# Patient Record
Sex: Female | Born: 1960 | Race: White | Hispanic: No | Marital: Single | Smoking: Former smoker
Health system: Southern US, Community
[De-identification: ages and names within clinical notes are randomized; demographics above are authoritative.]

## PROBLEM LIST (undated history)

## (undated) DIAGNOSIS — D6861 Antiphospholipid syndrome: Secondary | ICD-10-CM

## (undated) DIAGNOSIS — I4819 Other persistent atrial fibrillation: Secondary | ICD-10-CM

## (undated) DIAGNOSIS — I10 Essential (primary) hypertension: Secondary | ICD-10-CM

## (undated) DIAGNOSIS — Z95810 Presence of automatic (implantable) cardiac defibrillator: Secondary | ICD-10-CM

## (undated) DIAGNOSIS — I639 Cerebral infarction, unspecified: Secondary | ICD-10-CM

## (undated) DIAGNOSIS — R51 Headache: Secondary | ICD-10-CM

## (undated) DIAGNOSIS — I255 Ischemic cardiomyopathy: Secondary | ICD-10-CM

## (undated) DIAGNOSIS — K219 Gastro-esophageal reflux disease without esophagitis: Secondary | ICD-10-CM

## (undated) DIAGNOSIS — I472 Ventricular tachycardia: Secondary | ICD-10-CM

## (undated) DIAGNOSIS — I5042 Chronic combined systolic (congestive) and diastolic (congestive) heart failure: Secondary | ICD-10-CM

## (undated) DIAGNOSIS — I739 Peripheral vascular disease, unspecified: Secondary | ICD-10-CM

## (undated) DIAGNOSIS — Z8701 Personal history of pneumonia (recurrent): Secondary | ICD-10-CM

## (undated) DIAGNOSIS — Z789 Other specified health status: Secondary | ICD-10-CM

## (undated) DIAGNOSIS — E782 Mixed hyperlipidemia: Secondary | ICD-10-CM

## (undated) DIAGNOSIS — I219 Acute myocardial infarction, unspecified: Secondary | ICD-10-CM

## (undated) DIAGNOSIS — I4729 Other ventricular tachycardia: Secondary | ICD-10-CM

## (undated) DIAGNOSIS — I671 Cerebral aneurysm, nonruptured: Secondary | ICD-10-CM

## (undated) DIAGNOSIS — I2510 Atherosclerotic heart disease of native coronary artery without angina pectoris: Secondary | ICD-10-CM

## (undated) DIAGNOSIS — R519 Headache, unspecified: Secondary | ICD-10-CM

## (undated) DIAGNOSIS — I7300 Raynaud's syndrome without gangrene: Secondary | ICD-10-CM

## (undated) DIAGNOSIS — F1411 Cocaine abuse, in remission: Secondary | ICD-10-CM

## (undated) DIAGNOSIS — R760 Raised antibody titer: Secondary | ICD-10-CM

## (undated) HISTORY — PX: OTHER SURGICAL HISTORY: SHX169

## (undated) HISTORY — PX: APPENDECTOMY: SHX54

## (undated) HISTORY — DX: Peripheral vascular disease, unspecified: I73.9

## (undated) HISTORY — DX: Atherosclerotic heart disease of native coronary artery without angina pectoris: I25.10

## (undated) HISTORY — DX: Raynaud's syndrome without gangrene: I73.00

## (undated) HISTORY — DX: Ischemic cardiomyopathy: I25.5

## (undated) HISTORY — DX: Cerebral infarction, unspecified: I63.9

## (undated) HISTORY — DX: Antiphospholipid syndrome: D68.61

## (undated) HISTORY — DX: Essential (primary) hypertension: I10

## (undated) HISTORY — PX: CARDIAC DEFIBRILLATOR PLACEMENT: SHX171

## (undated) HISTORY — DX: Raised antibody titer: R76.0

## (undated) HISTORY — DX: Personal history of pneumonia (recurrent): Z87.01

## (undated) HISTORY — DX: Cocaine abuse, in remission: F14.11

## (undated) HISTORY — DX: Cerebral aneurysm, nonruptured: I67.1

## (undated) HISTORY — DX: Mixed hyperlipidemia: E78.2

## (undated) SURGERY — Surgical Case
Anesthesia: *Unknown

---

## 2005-12-30 DIAGNOSIS — I639 Cerebral infarction, unspecified: Secondary | ICD-10-CM

## 2005-12-30 HISTORY — DX: Cerebral infarction, unspecified: I63.9

## 2007-12-08 ENCOUNTER — Emergency Department (HOSPITAL_COMMUNITY): Admission: EM | Admit: 2007-12-08 | Discharge: 2007-12-08 | Payer: Self-pay | Admitting: Infectious Diseases

## 2007-12-16 ENCOUNTER — Ambulatory Visit: Payer: Self-pay | Admitting: Cardiology

## 2007-12-28 ENCOUNTER — Ambulatory Visit: Payer: Self-pay | Admitting: Cardiovascular Disease

## 2008-01-11 ENCOUNTER — Ambulatory Visit: Payer: Self-pay | Admitting: Cardiology

## 2008-01-13 ENCOUNTER — Ambulatory Visit: Payer: Self-pay

## 2008-01-25 ENCOUNTER — Ambulatory Visit: Payer: Self-pay | Admitting: Cardiology

## 2008-03-01 ENCOUNTER — Ambulatory Visit: Payer: Self-pay | Admitting: Cardiology

## 2008-03-08 ENCOUNTER — Ambulatory Visit: Payer: Self-pay | Admitting: Cardiology

## 2008-03-25 ENCOUNTER — Ambulatory Visit: Payer: Self-pay | Admitting: Internal Medicine

## 2008-03-31 ENCOUNTER — Ambulatory Visit: Payer: Self-pay | Admitting: Cardiovascular Disease

## 2008-04-14 ENCOUNTER — Ambulatory Visit: Payer: Self-pay | Admitting: Cardiology

## 2008-04-14 ENCOUNTER — Encounter (HOSPITAL_COMMUNITY): Admission: RE | Admit: 2008-04-14 | Discharge: 2008-05-14 | Payer: Self-pay | Admitting: Cardiovascular Disease

## 2008-05-19 ENCOUNTER — Ambulatory Visit: Payer: Self-pay | Admitting: Cardiovascular Disease

## 2008-05-30 ENCOUNTER — Emergency Department (HOSPITAL_COMMUNITY): Admission: EM | Admit: 2008-05-30 | Discharge: 2008-05-30 | Payer: Self-pay | Admitting: Emergency Medicine

## 2008-06-08 ENCOUNTER — Ambulatory Visit (HOSPITAL_COMMUNITY): Admission: RE | Admit: 2008-06-08 | Discharge: 2008-06-08 | Payer: Self-pay | Admitting: Neurology

## 2008-06-09 ENCOUNTER — Ambulatory Visit: Payer: Self-pay

## 2008-09-15 ENCOUNTER — Ambulatory Visit: Payer: Self-pay | Admitting: Cardiology

## 2008-09-29 ENCOUNTER — Ambulatory Visit: Payer: Self-pay | Admitting: Cardiology

## 2008-10-25 ENCOUNTER — Ambulatory Visit: Payer: Self-pay | Admitting: Cardiology

## 2008-10-26 ENCOUNTER — Ambulatory Visit (HOSPITAL_COMMUNITY): Admission: RE | Admit: 2008-10-26 | Discharge: 2008-10-26 | Payer: Self-pay | Admitting: Cardiology

## 2008-10-26 ENCOUNTER — Ambulatory Visit: Payer: Self-pay | Admitting: Cardiology

## 2008-10-26 ENCOUNTER — Encounter: Payer: Self-pay | Admitting: Cardiology

## 2008-11-18 ENCOUNTER — Ambulatory Visit (HOSPITAL_COMMUNITY): Admission: RE | Admit: 2008-11-18 | Discharge: 2008-11-18 | Payer: Self-pay | Admitting: Pulmonary Disease

## 2008-11-29 DIAGNOSIS — I255 Ischemic cardiomyopathy: Secondary | ICD-10-CM | POA: Insufficient documentation

## 2008-11-29 DIAGNOSIS — D6861 Antiphospholipid syndrome: Secondary | ICD-10-CM | POA: Insufficient documentation

## 2008-11-29 DIAGNOSIS — I679 Cerebrovascular disease, unspecified: Secondary | ICD-10-CM | POA: Insufficient documentation

## 2008-12-07 ENCOUNTER — Ambulatory Visit: Payer: Self-pay | Admitting: Internal Medicine

## 2008-12-15 ENCOUNTER — Ambulatory Visit: Payer: Self-pay | Admitting: Cardiology

## 2009-01-11 ENCOUNTER — Ambulatory Visit: Payer: Self-pay | Admitting: Cardiology

## 2009-01-19 ENCOUNTER — Ambulatory Visit: Payer: Self-pay

## 2009-02-01 ENCOUNTER — Ambulatory Visit: Payer: Self-pay | Admitting: Cardiology

## 2009-02-16 ENCOUNTER — Ambulatory Visit: Payer: Self-pay | Admitting: Cardiology

## 2009-03-02 ENCOUNTER — Encounter: Payer: Self-pay | Admitting: Internal Medicine

## 2009-03-06 ENCOUNTER — Ambulatory Visit: Payer: Self-pay | Admitting: Cardiology

## 2009-03-17 ENCOUNTER — Ambulatory Visit: Payer: Self-pay | Admitting: Cardiology

## 2009-03-20 ENCOUNTER — Ambulatory Visit: Payer: Self-pay | Admitting: Cardiology

## 2009-03-20 ENCOUNTER — Ambulatory Visit (HOSPITAL_COMMUNITY): Admission: RE | Admit: 2009-03-20 | Discharge: 2009-03-20 | Payer: Self-pay | Admitting: Cardiology

## 2009-03-20 ENCOUNTER — Encounter: Payer: Self-pay | Admitting: Cardiology

## 2009-04-06 ENCOUNTER — Ambulatory Visit: Payer: Self-pay | Admitting: Cardiology

## 2009-04-20 ENCOUNTER — Ambulatory Visit: Payer: Self-pay | Admitting: Cardiology

## 2009-04-20 ENCOUNTER — Encounter: Payer: Self-pay | Admitting: Internal Medicine

## 2009-05-19 ENCOUNTER — Encounter (INDEPENDENT_AMBULATORY_CARE_PROVIDER_SITE_OTHER): Payer: Self-pay

## 2009-05-19 LAB — CONVERTED CEMR LAB
ALT: 19 units/L
AST: 17 units/L
Albumin: 3.9 g/dL
Alkaline Phosphatase: 59 units/L
Bilirubin, Direct: 0.1 mg/dL
Cholesterol: 165 mg/dL
HDL: 33 mg/dL
LDL Cholesterol: 101 mg/dL
Potassium: 4.3 meq/L
Total Protein: 6.8 g/dL
Triglycerides: 153 mg/dL

## 2009-05-25 ENCOUNTER — Ambulatory Visit: Payer: Self-pay | Admitting: Cardiology

## 2009-06-23 ENCOUNTER — Encounter: Payer: Self-pay | Admitting: Cardiology

## 2009-06-23 ENCOUNTER — Ambulatory Visit: Payer: Self-pay | Admitting: Cardiology

## 2009-06-23 DIAGNOSIS — I739 Peripheral vascular disease, unspecified: Secondary | ICD-10-CM | POA: Insufficient documentation

## 2009-07-24 ENCOUNTER — Encounter: Payer: Self-pay | Admitting: Internal Medicine

## 2009-08-09 ENCOUNTER — Ambulatory Visit: Payer: Self-pay | Admitting: Cardiology

## 2009-08-11 ENCOUNTER — Encounter: Payer: Self-pay | Admitting: Internal Medicine

## 2009-08-14 ENCOUNTER — Encounter: Payer: Self-pay | Admitting: *Deleted

## 2009-09-08 ENCOUNTER — Encounter: Payer: Self-pay | Admitting: Internal Medicine

## 2009-09-20 ENCOUNTER — Encounter (INDEPENDENT_AMBULATORY_CARE_PROVIDER_SITE_OTHER): Payer: Self-pay | Admitting: Cardiology

## 2009-09-26 ENCOUNTER — Encounter: Payer: Self-pay | Admitting: Cardiology

## 2009-09-26 DIAGNOSIS — E782 Mixed hyperlipidemia: Secondary | ICD-10-CM | POA: Insufficient documentation

## 2009-09-26 DIAGNOSIS — E785 Hyperlipidemia, unspecified: Secondary | ICD-10-CM | POA: Insufficient documentation

## 2009-10-16 ENCOUNTER — Encounter: Payer: Self-pay | Admitting: Internal Medicine

## 2009-10-19 ENCOUNTER — Encounter (INDEPENDENT_AMBULATORY_CARE_PROVIDER_SITE_OTHER): Payer: Self-pay | Admitting: Cardiology

## 2009-10-25 ENCOUNTER — Encounter (INDEPENDENT_AMBULATORY_CARE_PROVIDER_SITE_OTHER): Payer: Self-pay

## 2009-11-16 ENCOUNTER — Encounter (INDEPENDENT_AMBULATORY_CARE_PROVIDER_SITE_OTHER): Payer: Self-pay | Admitting: Cardiology

## 2009-11-29 HISTORY — PX: OTHER SURGICAL HISTORY: SHX169

## 2009-12-09 ENCOUNTER — Encounter: Payer: Self-pay | Admitting: Emergency Medicine

## 2009-12-10 ENCOUNTER — Inpatient Hospital Stay (HOSPITAL_COMMUNITY): Admission: EM | Admit: 2009-12-10 | Discharge: 2009-12-21 | Payer: Self-pay | Admitting: Emergency Medicine

## 2009-12-10 ENCOUNTER — Ambulatory Visit: Payer: Self-pay | Admitting: Oncology

## 2009-12-10 ENCOUNTER — Ambulatory Visit: Payer: Self-pay | Admitting: Cardiology

## 2009-12-11 ENCOUNTER — Ambulatory Visit: Payer: Self-pay | Admitting: Thoracic Surgery

## 2009-12-12 ENCOUNTER — Encounter: Payer: Self-pay | Admitting: Cardiology

## 2009-12-14 ENCOUNTER — Encounter (INDEPENDENT_AMBULATORY_CARE_PROVIDER_SITE_OTHER): Payer: Self-pay | Admitting: Cardiology

## 2009-12-16 ENCOUNTER — Ambulatory Visit: Payer: Self-pay | Admitting: Hematology & Oncology

## 2009-12-18 ENCOUNTER — Ambulatory Visit: Payer: Self-pay | Admitting: Physical Medicine & Rehabilitation

## 2009-12-21 ENCOUNTER — Ambulatory Visit: Payer: Self-pay | Admitting: Oncology

## 2009-12-25 ENCOUNTER — Emergency Department (HOSPITAL_COMMUNITY): Admission: EM | Admit: 2009-12-25 | Discharge: 2009-12-25 | Payer: Self-pay | Admitting: Emergency Medicine

## 2009-12-25 ENCOUNTER — Encounter (INDEPENDENT_AMBULATORY_CARE_PROVIDER_SITE_OTHER): Payer: Self-pay | Admitting: *Deleted

## 2009-12-25 LAB — CONVERTED CEMR LAB
BUN: 16 mg/dL
CO2: 26 meq/L
Calcium: 9.3 mg/dL
Chloride: 96 meq/L
Creatinine, Ser: 0.72 mg/dL
GFR calc non Af Amer: >60 mL/min
Glomerular Filtration Rate, Af Am: >60 mL/min/{1.73_m2}
Glucose, Bld: 100 mg/dL
Potassium: 4.3 meq/L
Sodium: 132 meq/L

## 2009-12-27 ENCOUNTER — Ambulatory Visit: Payer: Self-pay | Admitting: Thoracic Surgery

## 2009-12-27 ENCOUNTER — Ambulatory Visit: Payer: Self-pay | Admitting: Cardiology

## 2009-12-27 ENCOUNTER — Encounter: Admission: RE | Admit: 2009-12-27 | Discharge: 2009-12-27 | Payer: Self-pay | Admitting: Thoracic Surgery

## 2009-12-27 LAB — CONVERTED CEMR LAB: POC INR: 3.1

## 2010-01-03 ENCOUNTER — Ambulatory Visit: Payer: Self-pay | Admitting: Cardiology

## 2010-01-03 LAB — CONVERTED CEMR LAB: POC INR: 3.1

## 2010-01-12 ENCOUNTER — Encounter (INDEPENDENT_AMBULATORY_CARE_PROVIDER_SITE_OTHER): Payer: Self-pay | Admitting: *Deleted

## 2010-01-17 ENCOUNTER — Ambulatory Visit: Payer: Self-pay | Admitting: Cardiology

## 2010-01-18 ENCOUNTER — Telehealth (INDEPENDENT_AMBULATORY_CARE_PROVIDER_SITE_OTHER): Payer: Self-pay

## 2010-01-25 ENCOUNTER — Ambulatory Visit: Payer: Self-pay | Admitting: Cardiovascular Disease

## 2010-01-25 LAB — CONVERTED CEMR LAB: POC INR: 3.5

## 2010-01-26 ENCOUNTER — Ambulatory Visit (HOSPITAL_COMMUNITY)
Admission: RE | Admit: 2010-01-26 | Discharge: 2010-01-26 | Payer: Self-pay | Source: Home / Self Care | Admitting: Cardiology

## 2010-01-30 ENCOUNTER — Encounter: Payer: Self-pay | Admitting: Cardiology

## 2010-02-07 ENCOUNTER — Ambulatory Visit: Payer: Self-pay | Admitting: Thoracic Surgery

## 2010-02-07 ENCOUNTER — Encounter: Admission: RE | Admit: 2010-02-07 | Discharge: 2010-02-07 | Payer: Self-pay | Admitting: Thoracic Surgery

## 2010-02-22 ENCOUNTER — Encounter: Admission: RE | Admit: 2010-02-22 | Discharge: 2010-02-22 | Payer: Self-pay | Admitting: Neurosurgery

## 2010-02-22 ENCOUNTER — Encounter (INDEPENDENT_AMBULATORY_CARE_PROVIDER_SITE_OTHER): Payer: Self-pay | Admitting: Cardiology

## 2010-03-05 ENCOUNTER — Ambulatory Visit: Payer: Self-pay | Admitting: Cardiology

## 2010-03-05 LAB — CONVERTED CEMR LAB: POC INR: 4.3

## 2010-03-19 ENCOUNTER — Ambulatory Visit: Payer: Self-pay | Admitting: Cardiology

## 2010-03-19 LAB — CONVERTED CEMR LAB: POC INR: 3.8

## 2010-04-11 ENCOUNTER — Ambulatory Visit: Payer: Self-pay | Admitting: Cardiology

## 2010-04-11 LAB — CONVERTED CEMR LAB: POC INR: 2

## 2010-04-30 ENCOUNTER — Ambulatory Visit: Payer: Self-pay | Admitting: Cardiology

## 2010-04-30 LAB — CONVERTED CEMR LAB: POC INR: 2.8

## 2010-05-14 ENCOUNTER — Emergency Department (HOSPITAL_COMMUNITY)
Admission: EM | Admit: 2010-05-14 | Discharge: 2010-05-14 | Payer: Self-pay | Source: Home / Self Care | Admitting: Emergency Medicine

## 2010-05-15 ENCOUNTER — Encounter: Payer: Self-pay | Admitting: Cardiology

## 2010-05-15 ENCOUNTER — Telehealth: Payer: Self-pay | Admitting: Cardiology

## 2010-05-15 LAB — CONVERTED CEMR LAB
INR: 2.19
Prothrombin Time: 24.2 s

## 2010-05-29 ENCOUNTER — Ambulatory Visit: Payer: Self-pay | Admitting: Cardiology

## 2010-05-29 DIAGNOSIS — I10 Essential (primary) hypertension: Secondary | ICD-10-CM | POA: Insufficient documentation

## 2010-05-29 DIAGNOSIS — R079 Chest pain, unspecified: Secondary | ICD-10-CM | POA: Insufficient documentation

## 2010-06-14 ENCOUNTER — Ambulatory Visit: Payer: Self-pay | Admitting: Cardiology

## 2010-06-14 ENCOUNTER — Encounter (HOSPITAL_COMMUNITY)
Admission: RE | Admit: 2010-06-14 | Discharge: 2010-06-14 | Payer: Self-pay | Source: Home / Self Care | Admitting: Cardiology

## 2010-06-14 ENCOUNTER — Ambulatory Visit (HOSPITAL_COMMUNITY)
Admission: RE | Admit: 2010-06-14 | Discharge: 2010-06-14 | Payer: Self-pay | Source: Home / Self Care | Admitting: Pulmonary Disease

## 2010-06-14 LAB — CONVERTED CEMR LAB: POC INR: 3

## 2010-06-25 ENCOUNTER — Encounter: Payer: Self-pay | Admitting: Cardiology

## 2010-07-11 ENCOUNTER — Ambulatory Visit: Payer: Self-pay | Admitting: Cardiology

## 2010-07-11 LAB — CONVERTED CEMR LAB: POC INR: 2.6

## 2010-08-09 ENCOUNTER — Encounter (INDEPENDENT_AMBULATORY_CARE_PROVIDER_SITE_OTHER): Payer: Self-pay | Admitting: *Deleted

## 2010-08-29 ENCOUNTER — Ambulatory Visit: Payer: Self-pay | Admitting: Cardiology

## 2010-08-29 LAB — CONVERTED CEMR LAB: POC INR: 3

## 2010-09-27 ENCOUNTER — Telehealth (INDEPENDENT_AMBULATORY_CARE_PROVIDER_SITE_OTHER): Payer: Self-pay

## 2010-09-28 ENCOUNTER — Encounter (INDEPENDENT_AMBULATORY_CARE_PROVIDER_SITE_OTHER): Payer: Self-pay | Admitting: *Deleted

## 2010-10-01 ENCOUNTER — Encounter (INDEPENDENT_AMBULATORY_CARE_PROVIDER_SITE_OTHER): Payer: Self-pay | Admitting: *Deleted

## 2010-10-18 ENCOUNTER — Encounter: Payer: Self-pay | Admitting: Internal Medicine

## 2010-11-12 ENCOUNTER — Encounter: Payer: Self-pay | Admitting: Internal Medicine

## 2010-12-07 ENCOUNTER — Encounter (INDEPENDENT_AMBULATORY_CARE_PROVIDER_SITE_OTHER): Payer: Self-pay | Admitting: *Deleted

## 2011-01-20 ENCOUNTER — Encounter: Payer: Self-pay | Admitting: Thoracic Surgery

## 2011-01-21 ENCOUNTER — Encounter: Payer: Self-pay | Admitting: Cardiology

## 2011-01-29 NOTE — Letter (Signed)
Summary: Device-Delinquent Check  [facility], Main Office  [address]   [place], [place] [address]   Phone: [phone]  Fax: [phone]     September 08, 2009 MRN: [id number]   [name] [name] [address] [place], [place]  [address]   Dear [name] [name],  According to our records, you have not had your implanted device checked in the recommended period of time.  We are unable to determine appropriate device function without checking your device on a regular basis.  Please call our office to schedule an appointment as soon as possible.  If you are having your device checked by another physician, please call us so that we may update our records.  Thank you,   Architectural technologist Device Clinic     *Sent via Certified Mail/AS

## 2011-01-29 NOTE — Letter (Signed)
Summary: Custom - Delinquent Coumadin 2  [facility]  [address] [place] [address]   Phone: [phone]  Fax: [phone]     December 14, 2009 MRN: [id number]   [employer] W[place] [address] [place], [place]  [address]   Dear Ms. Lutes,  We have attempted to contact you by phone and letter on multiple occasions to contact our office for important blood work associated with the blood thinner, warfarin (Coumadin).  Warfarin is a very important drug that can cause life threatening side effects including, bleeding, and thus requires close laboratory monitoring.  We are unable to accept responsibility for blood thinner-related health problems you may develop because you have not followed our recommendations for appropriate monitoring.  These may include abnormal bleeding occurrences and/or development of blood clots (stroke, heart attack, blood clots in legs or lungs, etc.).  We need for you to contact this office at the number listed above to schedule and complete this very important blood work.  Thank you for your assistance in this urgent matter.  Sincerely, [name], RN [facility] Cardiovascular Risk Reduction [facility] Team

## 2011-01-29 NOTE — Miscellaneous (Signed)
Summary: labs hosp bmp,12/25/2009  Clinical Lists Changes  Observations: Added new observation of CALCIUM: 9.3 mg/dL (16/09/9603 54:09) Added new observation of GFR AA: >60 mL/min/1.34m2 (12/25/2009 15:55) Added new observation of GFR: >60 mL/min (12/25/2009 15:55) Added new observation of CREATININE: 0.72 mg/dL (81/19/1478 29:56) Added new observation of BUN: 16 mg/dL (21/30/8657 84:69) Added new observation of BG RANDOM: 100 mg/dL (62/95/2841 32:44) Added new observation of CO2 PLSM/SER: 26 meq/L (12/25/2009 15:55) Added new observation of CL SERUM: 96 meq/L (12/25/2009 15:55) Added new observation of K SERUM: 4.3 meq/L (12/25/2009 15:55) Added new observation of NA: 132 meq/L (12/25/2009 15:55)

## 2011-01-29 NOTE — Letter (Signed)
Summary: [facility] Work Engineer, agricultural at [facility]  [address] [place] [address]   Phone: [phone]  Fax: [phone]     September 26, 2009 MRN: [id number]   [name] [name] [address] [place], [place]  [address]      YOUR LAB WORK IS DUE  The week of October 02, 2009 _________________________________________  Please go to [facility], located across the street from [facility] on the second floor.  Hours are Monday - Friday 7am until 7:30pm         Saturday 8am until 12noon    _X_  DO NOT EAT OR DRINK AFTER MIDNIGHT EVENING PRIOR TO LABWORK  __ YOUR LABWORK IS NOT FASTING --YOU MAY EAT PRIOR TO LABWORK

## 2011-01-29 NOTE — Miscellaneous (Signed)
Summary: Device preload  Clinical Lists Changes  Observations: Added new observation of ICD INDICATN: CM (03/02/2009 14:26) Added new observation of ICDLEADSTAT2: active (03/02/2009 14:26) Added new observation of ICDLEADSER2: 875643  (03/02/2009 14:26) Added new observation of ICDLEADMOD2: 0185  (03/02/2009 14:26) Added new observation of ICDLEADDOI2: 11/18/2007  (03/02/2009 14:26) Added new observation of ICDLEADLOC2: RV  (03/02/2009 14:26) Added new observation of ICDLEADSTAT1: active  (03/02/2009 14:26) Added new observation of ICDLEADSER1: [id number]  (03/02/2009 14:26) Added new observation of ICDLEADMOD1: 4136  (03/02/2009 14:26) Added new observation of ICDLEADDOI1: 11/18/2007  (03/02/2009 14:26) Added new observation of ICDLEADLOC1: RA  (03/02/2009 14:26) Added new observation of ICD IMPL DTE: 11/18/2007  (03/02/2009 14:26) Added new observation of ICD SERL#: 166063  (03/02/2009 14:26) Added new observation of ICD MODL#: EO30  (03/02/2009 14:26) Added new observation of ICDMANUFACTR: [employer]  (03/02/2009 14:26) Added new observation of CARDIO MD: Lewayne Bunting, MD  (03/02/2009 14:26)      PPM Specifications Following MD:  Lewayne Bunting, MD       ICD Specifications Following MD: Lewayne Bunting, MD      ICD Vendor: [facility] [employer]     ICD Model Number: EO30     ICD Serial Number: [id number]  ICD DOI: 11/18/2007      Lead 1:    Location: RA     DOI: 11/18/2007     Model #: [id number]     Serial #: [id number]     Status: active Lead 2:    Location: RV     DOI: 11/18/2007     Model #: [id number]     Serial #: [id number]     Status: active  Indications::  CM

## 2011-01-29 NOTE — Letter (Signed)
Summary: Device-Delinquent Phone Journalist, newspaper, Main Office  [address]   [place], [place] [address]   Phone: [phone]  Fax: [phone]     July 24, 2009 MRN: [id number]   [name] [name] [address] [place], [place]  [address]   Dear [name],  According to our records, you were scheduled for a device phone transmission on July 20, 2009.     We did not receive any results from this check.  If you transmitted on your scheduled day, please call us to help troubleshoot your system.  If you forgot to send your transmission, please send one upon receipt of this letter.  Thank you,   Architectural technologist Device Clinic

## 2011-01-29 NOTE — Progress Notes (Signed)
Summary: Standing Order for PT/INR  Phone Note Call from Patient   Caller: Patient Reason for Call: Talk to Nurse Summary of Call: [facility] / states that her insurance is not covering it here and it will be cheaper for her to do it there / states that she has spoke to [facility] about this and she said it would be fine /tg Initial call taken by: Raechel Ache [facility],  September 27, 2010 8:17 AM  Follow-up for Phone Call        Orders faxed to [facility] @ [phone], pt. aware. Follow-up by: Larita Fife Via LPN,  September 27, 2010 9:06 AM  New Problems: COUMADIN THERAPY (ICD-V58.61)   New Problems: COUMADIN THERAPY (ICD-V58.61)

## 2011-01-29 NOTE — Assessment & Plan Note (Signed)
Summary: [facility]  Medications Added CARVEDILOL 12.5 MG TABS (CARVEDILOL) Take 1 tablet by mouth twice a day NITROGLYCERIN 0.4 MG SUBL (NITROGLYCERIN) Place 1 tablet under tongue as directed KLOR-CON 10 10 MEQ CR-TABS (POTASSIUM CHLORIDE) Take 1 tablet by mouth two times a day MOTRIN IB 200 MG TABS (IBUPROFEN) as needed ZETIA 10 MG TABS (EZETIMIBE) Take one tablet by mouth daily.      Allergies Added:   Visit Type:  Follow-up Primary Provider:  Kari Baars, MD   History of Present Illness: Patient was last seen in the office back in March of this year. She reports no problems with progressive angina. She is tolerating omega-3 supplements and Zetia, although has had difficulty obtaining her Zetia due to financial constraints. Lipid profile obtained in May showed an improvement in LDL from 128 down to 101, HDL 33, triglycerides 153, and total cholesterol 165. Liver function tests were normal. She has had no defibrillator discharges. Recent followup echocardiogram shows a left ventricular ejection fraction of 45%, improved over her original baseline. She reports NYHA class II dyspnea on exertion and no problems with palpitations.she continues to smoke cigarettes, approximately 1/2 pack per day. I discussed with her the critical importance of smoking cessation. She continues to work on weight loss through diet. She has had no dizziness or syncope.  Current Medications (verified): 1)  Digoxin 0.25 Mg Tabs (Digoxin) .... One By Mouth Daily 2)  Coreg 12.5 Mg Tabs (Carvedilol) .... One By Mouth Two Times A Day 3)  Warfarin Sodium 10 Mg Tabs (Warfarin Sodium) .... One By Mouth Daily 4)  Furosemide 40 Mg Tabs (Furosemide) .... 1/2 By Mouth Daily 5)  Pepcid 20 Mg Tabs (Famotidine) .... One By Mouth Two Times A Day 6)  Lisinopril 2.5 Mg Tabs (Lisinopril) .... One By Mouth Daily 7)  Fish Oil 1000 Mg Caps (Omega-3 Fatty Acids) .... One By Mouth Two Times A Day 8)  Bayer Low Strength 81 Mg  Tbec (Aspirin) .... One By Mouth Daily 9)  Carvedilol 12.5 Mg Tabs (Carvedilol) .... Take 1 Tablet By Mouth Twice A Day 10)  Nitroglycerin 0.4 Mg Subl (Nitroglycerin) .... Place 1 Tablet Under Tongue As Directed 11)  Klor-Con 10 10 Meq Cr-Tabs (Potassium Chloride) .... Take 1 Tablet By Mouth Two Times A Day 12)  Motrin Ib 200 Mg Tabs (Ibuprofen) .... As Needed  Allergies (verified): 1)  ! Pcn  Past History:  Past Medical History: Last updated: 11/29/2008 Prior bilateral pneumonia. Congestive heart failure. Cerebrovascular disease; status post CVA. 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 Coronary artery disease with ischemic cardiomyopathy and nonsustained ventricular tachycardia; AICD placed in 2008.Severe 2 vessel disease with ejection fraction of 30%. Anticardiolipin antibody with hypercoagulable state. Tobacco use. History of cocaine use. 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 Unmarried with no children. Tobacco Use - Yes.  Smoking Status:  current  Review of Systems       She complains of a burning pain in her thighs and calves when walking approximately 100 yards. This resolved with rest and is suggestive of claudication. Otherwise systems were reviewed and negative.  Vital Signs:  Patient profile:   50 year old female Height:      70 inches Weight:      195 pounds BMI:     28.08 Pulse rate:   60 / minute BP sitting:   90 / 62  (  right arm)  Vitals Entered By: Dreama Saa, CNA (June 23, 2009 11:29 AM)  Physical Exam  Additional Exam:  Overweight woman in no acute distress without active chest pain. HEENT: Conjunctiva and lids are normal, oropharynx clear. Neck: Supple no loud carotid bruits, very soft bruit on the right. Lungs: Clear to auscultation without labored breathing. Cardiac: Regular rate and rhythm, no S3  gallop. PMI indistinct. No loud systolic murmur. Abdomen: Soft nontender, no hepatomegalyl. Bowel sounds present. Extremities: Diminished dorsalis pedis pulses bilaterally. No pitting edema. Musculoskeletal: No kyphosis noted. Neuropsychiatric: Patient alert and oriented x3, affect is grossly normal.   Echocardiogram  Procedure date:  03/20/2009  Findings:        SUMMARY   -  There is akinesis at the base of the inferior wall and the base         of the posterior wall. The is a dyskinetic type of movement         of part of the apex. The other walls move well. Overall left         ventricular systolic function was mildly to moderately         decreased. Left ventricular ejection fraction was estimated         to be 45 %. Left ventricular wall thickness was mildly to         moderately increased.   -  There was mild mitral valvular regurgitation. The effective         orifice of mitral regurgitation by proximal isovelocity         surface area was 0.2 cm^2. The volume of mitral regurgitation         by proximal isovelocity surface area was 33 cc.   -  Left atrial size was at the upper limits of normal.   -  There was the appearance of a catheter or pacing wire in the         right ventricle.   Carotid Doppler  Procedure date:  01/19/2009  Findings:       Chronic occlusion of the right internal carotid artery. Mild carotid disease in the left bulb. 100% right internal carotid artery stenosis. 0-39% left internal carotid artery stenosis.  ICD Specifications ICD Vendor:  [employer]     ICD Model Number:  EO30     ICD Serial Number:  [id number] ICD DOI:  11/18/2007      Lead 1:    Location: RA     DOI: 11/18/2007     Model #: [id number]     Serial #: [id number]     Status: active Lead 2:    Location: RV     DOI: 11/18/2007     Model #: [id number]     Serial #: [id number]     Status: active  Indications::  CM   ICD Specs  Episodes  Brady Parameters  Tachy Zones  Impression &  Recommendations:  Problem # 1:  CARDIOMYOPATHY, ISCHEMIC (ICD-414.8) Assessment Unchanged Stable symptomatically on medical therapy. Reassessment of left ventricular function by echocardiography in March shows a left ventricular ejection fraction of 45%.the patient describes NYHA class II dyspnea on exertion and has had no problems with recurrent lower extremity edema. I discussed regular exercise and weight loss with her as well as being compliant with her medications. She is not having any major problems with orthostasis based on symptom review. Defibrillator followup will continue with Dr. Ladona Ridgel as already arranged. We will see her back over  the next 4 months.  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 INR was 2.9 on last assessment.  Problem # 3:  CEREBROVASCULAR DISEASE (ICD-437.9) Assessment: Unchanged Followup carotid Dopplers are outlined above from January of this year. A repeat Doppler will be arranged for January of 2011.  Problem # 4:  CLAUDICATION (ICD-443.9) Assessment: New Ms. Ledgerwood is describing leg pain consistent with claudication. I discussed with her the critical importance of a regular walking regimen and also smoking cessation.  Lower extremity arterial Dopplers with ankle-brachial indices will be obtained.  Patient Instructions: 1)  Your physician recommends that you schedule a follow-up appointment in: 4 months 2)  Your physician has requested that you have an ankle brachial index (ABI). During this test an ultrasound and blood pressure cuff are used to evaluate the arteries that supply the arms and legs with blood. Allow thirty minutes for this exam. There are no restrictions or special instructions.

## 2011-01-29 NOTE — Letter (Signed)
Summary: Custom - Delinquent Coumadin 1  [facility]  [address] [place] [address]   Phone: [phone]  Fax: [phone]     February 22, 2010 MRN: [id number]   [name] [name] [address] [place], [place]  [address]   Dear [name],  This letter is being sent to you as a reminder that it is necessary for you to get your INR/PT checked regularly so that we can optimize your care.  Our records indicate that you were scheduled to have a test done recently.  As of today, we have not received the results of this test.  It is very important that you have your INR checked.  Please call our office at the number listed above to schedule an appointment at your earliest convenience.    If you have recently had your protime checked or have discontinued this medication, please contact our office at the above phone number to clarify this issue.  Thank you for this prompt attention to this important health care matter.  Sincerely, [name] RN  [facility] Cardiovascular Risk Reduction Clinic Team

## 2011-01-29 NOTE — Letter (Signed)
Summary: Custom - Delinquent Coumadin 2  [facility]  [address] [place] [address]   Phone: [phone]  Fax: [phone]     October 19, 2009 MRN: [id number]   [name] [name] [address] [place], [place]  [address]   Dear [name],  We have attempted to contact you by phone and letter on multiple occasions to contact our office for important blood work associated with the blood thinner, warfarin (Coumadin).  Warfarin is a very important drug that can cause life threatening side effects including, bleeding, and thus requires close laboratory monitoring.  We are unable to accept responsibility for blood thinner-related health problems you may develop because you have not followed our recommendations for appropriate monitoring.  These may include abnormal bleeding occurrences and/or development of blood clots (stroke, heart attack, blood clots in legs or lungs, etc.).  We need for you to contact this office at the number listed above to schedule and complete this very important blood work.  Thank you for your assistance in this urgent matter.  Sincerely, [name], RN  [facility] Cardiovascular Risk Reduction Clinic Team

## 2011-01-29 NOTE — Medication Information (Signed)
Summary: ccr-lr  Anticoagulant Therapy  Managed by: [name], RN PCP: Kari Baars, MD Supervising MD: Daleen Squibb MD, Maisie Fus Indication 1: Abnormal Coagulation Profile (ICD-790.92) Indication 2: stroke (ICD-436.0) Lab Used: [facility] [place] Site: [place] INR POC 3.1  Dietary changes: no    Health status changes: no    Bleeding/hemorrhagic complications: no    Recent/future hospitalizations: no    Any changes in medication regimen? no    Recent/future dental: no  Any missed doses?: no       Is patient compliant with meds? yes       Allergies: 1)  ! Pcn  Anticoagulation Management History:      The patient is taking warfarin and comes in today for a routine follow up visit.  Negative risk factors for bleeding include an age less than 17 years old.  The bleeding index is 'low risk'.  Negative CHADS2 values include Age > 37 years old.  The start date was 12/16/2007.  Anticoagulation responsible provider: Daleen Squibb MD, Maisie Fus.  INR POC: 3.1.  Cuvette Lot#: [id number].    Anticoagulation Management Assessment/Plan:      The patient's current anticoagulation dose is Warfarin sodium 10 mg tabs: one by mouth daily.  The target INR is 2 - 3.  The next INR is due 01/24/2010.  Anticoagulation instructions were given to patient.  Results were reviewed/authorized by [name], RN.  She was notified by [name] RN.         Prior Anticoagulation Instructions: INR 3.1 Continue coumadin 10mg  once daily except 5mg  on Wednesdays Continue Arixtra until gone.  Has 5 shots left. Recheck INR on 01/03/10  Current Anticoagulation Instructions: INR 3.1 continue coumadin 10mg  once daily except 5mg  on Wednesdays

## 2011-01-29 NOTE — Letter (Signed)
Summary: [place] Treadmill (Nuc Med Stress)  [facility]  [address] [place] [address]   Phone: [phone]  Fax: [phone]    Nuclear Medicine 1-Day Stress Test Information Sheet  Re:     [name] [name]   DOB:     1961/11/02 MRN:     [id number] Weight:  Appointment Date: Register at: Appointment Time: Referring MD:  ___Exercise Stress  __Adenosine   __Dobutamine  _X_Lexiscan  __Persantine   __Thallium  Urgency: ____1 (next day)   ____2 (one week)    ____3 (PRN)  Patient will receive Follow Up call with results: Patient needs follow-up appointment:  Instructions regarding medication:  How to prepare for your stress test: 1. DO NOT eat or dring 6 hours prior to your arrival time. This includes no caffeine (coffee, tea, sodas, chocolate) if you were instructed to take your medications, drink water with it. 2. DO NOT use any tobacco products for at leaset 8 hours prior to arrival. 3. DO NOT wear dresses or any clothing that may have metal clasps or buttons. 4. Wear short sleeve shirts, loose clothing, and comfortalbe walking shoes. 5. DO NOT use lotions, oils or powder on your chest before the test. 6. The test will take approximately 3-4 hours from the time you arrive until completion. 7. To register the day of the test, go to the Short Stay entrance at [facility]. 8. If you must cancel your test, call [phone] as soon as you are aware.  After you arrive for test:   When you arrive at [facility], you will go to Short Stay to be registered. They will then send you to Radiology to check in. The Nuclear Medicine Tech will get you and start an IV in your arm or hand. A small amount of a radioactive tracer will then be injected into your IV. This tracer will then have to circulate for 30-45 minutes. During this time you will wait in the waiting room and you will be able to drink something without caffeine. A series of pictures will be taken  of your heart follwoing this waiting period. After the 1st set of pictures you will go to the stress lab to get ready for your stress test. During the stress test, another small amount of a radioactive tracer will be injected through your IV. When the stress test is complete, there is a short rest period while your heart rate and blood pressure will be monitored. When this monitoring period is complete you will have another set of pictrues taken. (The same as the 1st set of pictures). These pictures are taken between 15 minutes and 1 hour after the stress test. The time depends on the type of stress test you had. Your doctor will inform you of your test results within 7 days after test.    The possibilities of certain changes are possible during the test. They include abnormal blood pressure and disorders of the heart. Side effects of persantine or adenosine can include flushing, chest pain, shortness of breath, stomach tightness, headache and light-headedness. These side effects usually do not last long and are self-resolving. Every effort will be made to keep you comfortable and to minimize complications by obtaining a medical history and by close observation during the test. Emergency equipment, medications, and trained personnel are available to deal with any unusual situation which may arise.  Please notify office at least 48 hours in advance if you are unable to keep  this appt.

## 2011-01-29 NOTE — Letter (Signed)
Summary: Plantation Results Engineer, agricultural at [facility]  [address], [place] [address]   Phone: [phone]  Fax: [phone]      January 30, 2010 MRN: [id number]   [name] [name] [address] [place], [place]  [address]   Dear [name],  Your test ordered by Selena Batten has been reviewed by your physician (or physician assistant) and was found to be normal or stable. Your physician (or physician assistant) felt no changes were needed at this time.  ____ Echocardiogram  ____ Cardiac Stress Test  ____ Lab Work  __X__ Peripheral vascular study of arms, legs or neck  ____ CT scan or X-ray  ____ Lung or Breathing test  ____ Other: Please continue on current medical treatment.  Thank you.   Nona Dell, MD, F.A.C.C

## 2011-01-29 NOTE — Medication Information (Signed)
Summary: ccr-lr  Anticoagulant Therapy  Managed by: [name], RN PCP: Dr. Kari Baars Supervising MD: Dietrich Pates MD, Molly Maduro Indication 1: Abnormal Coagulation Profile (ICD-790.92) Indication 2: stroke (ICD-436.0) Lab Used: [facility] [place] Site: [place] INR POC 2.0  Dietary changes: no    Health status changes: no    Bleeding/hemorrhagic complications: no    Recent/future hospitalizations: no    Any changes in medication regimen? no    Recent/future dental: no  Any missed doses?: no       Is patient compliant with meds? yes       Allergies: 1)  ! Pcn  Anticoagulation Management History:      The patient is taking warfarin and comes in today for a routine follow up visit.  Negative risk factors for bleeding include an age less than 48 years old.  The bleeding index is 'low risk'.  Negative CHADS2 values include Age > 48 years old.  The start date was 12/16/2007.  Anticoagulation responsible provider: Dietrich Pates MD, Molly Maduro.  INR POC: 2.0.  Cuvette Lot#: [id number].    Anticoagulation Management Assessment/Plan:      The patient's current anticoagulation dose is Warfarin sodium 10 mg tabs: one by mouth daily.  The target INR is 2 - 3.  The next INR is due 05/09/2010.  Anticoagulation instructions were given to patient.  Results were reviewed/authorized by [name], RN.  She was notified by [name] RN.         Prior Anticoagulation Instructions: Pt has been taking 10mg  once daily except 5mg  on M,W,F instead of 5mg  once daily except 10mg  on M,W,F INR 3.8 Hold coumadin tonight then take 5mg  once daily except 10mg  on Mondays, Wednesdays and FridaysThe patient is to hold four doses of coumadin.  The dosage to be resumed includes:   Current Anticoagulation Instructions: INR 2.0 Increase coumadin 10mg  once daily except 5mg  on Sundays, Tuesdays and Thursdays

## 2011-01-29 NOTE — Medication Information (Signed)
Summary: CCR  Anticoagulant Therapy  Managed by: [name], RN PCP: Kari Baars, MD Supervising MD: Dietrich Pates MD, Molly Maduro Indication 1: Abnormal Coagulation Profile (ICD-790.92) Indication 2: stroke (ICD-436.0) Lab Used: [facility] [place] Site: [place] INR POC 3.1  Dietary changes: no    Health status changes: yes       Details: fell off horse  spinal Fx  Bleeding/hemorrhagic complications: no    Recent/future hospitalizations: yes       Details: Been in [facility] hospital with spinal Fx and surgery  Any changes in medication regimen? yes       Details: was d/c home on coumadin 10mg  qd except 5mg  on Wed. and Arixtra 7.5mg  SQ qd   Recent/future dental: no  Any missed doses?: no       Is patient compliant with meds? yes      Comments: Received fax from Dr Darrold Span.  Pt to continue arixtra 7.5mg  Subcutaneously until INR theraputic and a week after.  Pt states she has had 5 injections and has 5 shots left.    Allergies: 1)  ! Pcn  Anticoagulation Management History:      The patient is taking warfarin and comes in today for a routine follow up visit.  Negative risk factors for bleeding include an age less than 83 years old.  The bleeding index is 'low risk'.  Negative CHADS2 values include Age > 57 years old.  The start date was 12/16/2007.  Anticoagulation responsible provider: Dietrich Pates MD, Molly Maduro.  INR POC: 3.1.  Cuvette Lot#: [id number].    Anticoagulation Management Assessment/Plan:      The patient's current anticoagulation dose is Warfarin sodium 10 mg tabs: one by mouth daily.  The target INR is 2 - 3.  The next INR is due 01/03/2010.  Anticoagulation instructions were given to patient.  Results were reviewed/authorized by [name], RN.  She was notified by [name] RN.         Prior Anticoagulation Instructions: 10 mg daily/5 mg Wed  Current Anticoagulation Instructions: INR 3.1 Continue coumadin 10mg  once daily except 5mg  on Wednesdays  Continue Arixtra until gone.  Has 5 shots left. Recheck INR on 01/03/10  Appended Document: CCR Received fax from Dr Darrold Span on 12/28/09.  OK for pt to stop arixtra.  Pt was notified by their office.

## 2011-01-29 NOTE — Letter (Signed)
Summary: Appointment - Missed  [facility]     [place], [place]    Phone:   Fax:      October 01, 2010 MRN: [id number]   [facility] [address] [place], [place]  [address]   Dear Ms. Rorabaugh,  Our records indicate you missed your appointment on          10/01/10 COUMADIN CLINIC         It is very important that we reach you to reschedule this appointment. We look forward to participating in your health care needs. Please contact us at the number listed above at your earliest convenience to reschedule this appointment.     Sincerely,    Glass blower/designer

## 2011-01-29 NOTE — Medication Information (Signed)
Summary: ccr-lr  Anticoagulant Therapy  Managed by: [name], RN PCP: Dr. Kari Baars Supervising MD: Dietrich Pates MD, Molly Maduro Indication 1: Abnormal Coagulation Profile (ICD-790.92) Indication 2: stroke (ICD-436.0) Lab Used: [facility] [place] Site: [place] INR POC 3.8  Dietary changes: no    Health status changes: no    Bleeding/hemorrhagic complications: no    Recent/future hospitalizations: no    Any changes in medication regimen? no    Recent/future dental: no  Any missed doses?: no       Is patient compliant with meds? yes       Allergies: 1)  ! Pcn  Anticoagulation Management History:      The patient is taking warfarin and comes in today for a routine follow up visit.  Negative risk factors for bleeding include an age less than 72 years old.  The bleeding index is 'low risk'.  Negative CHADS2 values include Age > 46 years old.  The start date was 12/16/2007.  Anticoagulation responsible provider: Dietrich Pates MD, Molly Maduro.  INR POC: 3.8.  Cuvette Lot#: [id number].    Anticoagulation Management Assessment/Plan:      The patient's current anticoagulation dose is Warfarin sodium 10 mg tabs: one by mouth daily.  The target INR is 2 - 3.  The next INR is due 04/11/2010.  Anticoagulation instructions were given to patient.  Results were reviewed/authorized by [name], RN.  She was notified by [name] RN.         Prior Anticoagulation Instructions: INR 4.3 Hold coumadin tonight then decrease dose to 5mg  once daily except 10mg  on Mondays, Wednesdays and Fridays  Current Anticoagulation Instructions: Pt has been taking 10mg  once daily except 5mg  on M,W,F instead of 5mg  once daily except 10mg  on M,W,F INR 3.8 Hold coumadin tonight then take 5mg  once daily except 10mg  on Mondays, Wednesdays and FridaysThe patient is to hold four doses of coumadin.  The dosage to be resumed includes:

## 2011-01-29 NOTE — Letter (Signed)
Summary: [facility] [employer]  [address] [place] [address]   Phone: [phone]  Fax: [phone]     January 17, 2010 MRN: [id number]   [name] [name] [address] [place], [place]  [address]      YOUR LAB WORK IS DUE  July 09, 2010 _________________________________________  Please go to [facility], located across the street from [facility] on the second floor.  Hours are Monday - Friday 7am until 7:30pm         Saturday 8am until 12noon    _X_  DO NOT EAT OR DRINK AFTER MIDNIGHT EVENING PRIOR TO LABWORK  __ YOUR LABWORK IS NOT FASTING --YOU MAY EAT PRIOR TO LABWORK

## 2011-01-29 NOTE — Letter (Signed)
Summary: [facility] - Delinquent Coumadin 1  [facility]  [address] [place] [address]   Phone: [phone]  Fax: [phone]     September 20, 2009 MRN: [id number]   [name] [name] [address] [place], [place]  [address]   Dear Ms. Merk,  This letter is being sent to you as a reminder that it is necessary for you to get your INR/PT checked regularly so that we can optimize your care.  Our records indicate that you were scheduled to have a test done recently.  As of today, we have not received the results of this test.  It is very important that you have your INR checked.  Please call our office at the number listed above to schedule an appointment at your earliest convenience.    If you have recently had your protime checked or have discontinued this medication, please contact our office at the above phone number to clarify this issue.  Thank you for this prompt attention to this important health care matter.  Sincerely, [name], RN  [facility] Team    Appended Document: [facility] - Delinquent Coumadin 1 Letter mailed 09/20/09.

## 2011-01-29 NOTE — Assessment & Plan Note (Signed)
Summary: PT'S HAVING PROBLEMS W/DIZZINESS WHEN STANDING/TG   Visit Type:  Follow-up Primary Provider:  Dr. Kari Baars   History of Present Illness: 50 year old woman presents for a followup visit. She missed her last visit due to a hospitalization. She was thrown from her horse while riding, resulting in an L1 burst fracture without spinal cord injury. She underwent surgical repair in December 2010, and actually has done quite well. She was seen in the perioperative setting by our cardiology service at [facility], and cleared for surgery. She had no obvious perioperative cardiac events. A followup echocardiogram done in December 2010 demonstrated a stable left ventricular ejection fraction of 45%. She was transiently off Coumadin for her surgery, now back on. She continues to wear a thoracic brace, and recently saw Dr. Edwyna Shell in December. She is doing very well.  We did discuss followup testing at the last visit, specifically lower extremity arterial studies to evaluate claudication, and followup carotid studies. These were not done. Ms. Hendley indicates that her claudication is better, stating that she has stopped smoking since I last saw her. We did talk about a followup carotid study.  Ms. Widrig stopped taking Zetia for financial reasons. She does plan to resume eventually. We will obtain followup lipids prior to her next visit.  She is not reporting any angina at this time.  Preventive Screening-Counseling & Management  Alcohol-Tobacco     Smoking Status: quit  Current Medications (verified): 1)  Digoxin 0.25 Mg Tabs (Digoxin) .... One By Mouth Daily 2)  Warfarin Sodium 10 Mg Tabs (Warfarin Sodium) .... One By Mouth Daily 3)  Furosemide 40 Mg Tabs (Furosemide) .... 1/2 By Mouth Daily 4)  Pepcid 20 Mg Tabs (Famotidine) .... One By Mouth Two Times A Day 5)  Lisinopril 2.5 Mg Tabs (Lisinopril) .... One By Mouth Daily 6)  Bayer Low Strength 81 Mg Tbec (Aspirin) .... One By Mouth  Daily 7)  Carvedilol 12.5 Mg Tabs (Carvedilol) .... Take 1 Tablet By Mouth Twice A Day 8)  Nitroglycerin 0.4 Mg Subl (Nitroglycerin) .... Place 1 Tablet Under Tongue As Directed 9)  K-Lor 20 Meq Pack (Potassium Chloride) .... Take 1 Tab Daily 10)  Motrin Ib 200 Mg Tabs (Ibuprofen) .... As Needed  Allergies (verified): 1)  ! 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 Unmarried with no children. Tobacco Use - Former.  Smoking Status:  quit  Review of Systems  The patient denies anorexia, fever, chest pain, syncope, dyspnea on exertion, peripheral edema, hemoptysis, melena, and hematochezia.         Occasional dizziness. Otherwise reviewed and negative.  Vital Signs:  Patient profile:   50 year old female Weight:      176 pounds Pulse rate:   88 / minute BP sitting:   117 / 74  (right arm)  Vitals Entered By: Dreama Saa, CNA (January 17, 2010 1:39 PM)  Physical Exam  Additional Exam:  Overweight woman in no acute distress without active chest pain. HEENT: Conjunctiva and lids are  normal, oropharynx clear. Neck: Supple no loud carotid bruits, very soft bruit on the right. Lungs: Clear to auscultation without labored breathing. Cardiac: Regular rate and rhythm, no S3 gallop. PMI indistinct. No loud systolic murmur. Abdomen: Soft nontender, no hepatomegalyl. Bowel sounds present. Extremities:  Diminished dorsalis pedis pulses bilaterally. No pitting edema. Musculoskeletal: No kyphosis noted. Neuropsychiatric: Patient alert and oriented x3, affect is grossly normal.    ICD Specifications Following MD:  Inactive     ICD Vendor:  [employer]     ICD Model Number:  EO30     ICD Serial Number:  [id number] ICD DOI:  11/18/2007      Lead 1:    Location: RA     DOI: 11/18/2007     Model #: [id number]     Serial #: [id number]     Status: active Lead 2:    Location: RV     DOI: 11/18/2007     Model #: [id number]     Serial #: [id number]     Status: active  Indications::  CM  Explantation Comments: 10-16-09 No response to 2 letters and the certified letter was unclaimed.  Patient made inactive for device follow-up. Counselling psychologist RN BSN  October 16, 2009 2:28 PM   Episodes Coumadin:  Yes  Brady Parameters Mode DDD     Lower Rate Limit:  50     Upper Rate Limit 125 PAV 250      Tachy Zones VF:  200     VT:  170     Impression & Recommendations:  Problem # 1:  CARDIOMYOPATHY, ISCHEMIC (ICD-414.8)  Symptomatically stable on present medical regimen. Patient had no obvious perioperative cardiovascular complications in December. Left ventricular ejection fraction was stable at 45% at that time. We will continue the present medical regimen, and I will see her back in 6 months.  The following medications were removed from the medication list:    Coreg 12.5 Mg Tabs (Carvedilol) ..... One by mouth two times a day Her updated medication list for this problem includes:    Digoxin 0.25 Mg Tabs (Digoxin) ..... One by mouth daily    Warfarin Sodium 10 Mg Tabs (Warfarin sodium) ..... One by mouth daily    Furosemide 40 Mg Tabs (Furosemide) .Marland Kitchen... 1/2 by mouth daily    Lisinopril 2.5 Mg Tabs (Lisinopril) ..... One by mouth daily    Bayer Low Strength 81 Mg Tbec (Aspirin) ..... One by mouth daily    Carvedilol 12.5 Mg Tabs (Carvedilol) .Marland Kitchen... Take 1 tablet by mouth twice a day    Nitroglycerin 0.4 Mg Subl  (Nitroglycerin) .Marland Kitchen... Place 1 tablet under tongue as directed  Problem # 2:  HYPERLIPIDEMIA (ICD-272.4)  Patient currently off Zetia for financial reasons. She has had a statin intolerance. She does plan to resume Zetia, and we will repeat lipid profile with liver function tests prior to her next visit.  The following medications were removed from the medication list:    Zetia 10 Mg Tabs (Ezetimibe) .Marland Kitchen... Take one tablet by mouth daily.  Future Orders: T-Hepatic Function [phone]) ... 07/09/2010 T-Lipid Profile [phone]) ... 07/09/2010  Problem # 3:  CEREBROVASCULAR DISEASE (ICD-437.9)  Patient due for followup carotid Dopplers. These will be arranged.  Problem # 4:  ANTICARDIOLIPIN ANTIBODY SYNDROME (ICD-795.79)  Patient continues on Coumadin.  Other Orders: Carotid Duplex (Carotid Duplex)  Patient Instructions: 1)  Your physician recommends that you schedule a follow-up appointment in: 6 months 2)  Your physician recommends that  you return for lab work in:6 months 3)  Your physician has requested that you have a carotid duplex. This test is an ultrasound of the carotid arteries in your neck. It looks at blood flow through these arteries that supply the brain with blood. Allow one hour for this exam. There are no restrictions or special instructions.

## 2011-01-29 NOTE — Assessment & Plan Note (Signed)
Summary: f/u per pt request for BP issues/tg   Visit Type:  Follow-up Primary Provider:  Dr. Kari Baars   History of Present Illness: 50 year old woman presents for followup. Overall she reports doing relatively well. She had perhaps one episode of relative hypotension that sounds orthostatic in nature, although has been able to tolerate her present regimen for cardiomyopathy. She did switch her lisinopril to the evening, and I agree with this change. We reviewed her medications today.  Ms. Schexnayder does state that she has occasional tightness in her chest, not entirely certain if it represents angina or perhaps reflux. She does take nitroglycerin, perhaps once or twice a month with relief. She also belches frequently at these times. She has not undergone any recent followup ischemic evaluation.  She maintains smoking cessation since her back surgery. She also states that she has been trying to increase her walking, and plans to resume Weight Watchers soon.  Current Medications (verified): 1)  Digoxin 0.25 Mg Tabs (Digoxin) .... One By Mouth Daily 2)  Warfarin Sodium 10 Mg Tabs (Warfarin Sodium) .... One By Mouth Daily 3)  Furosemide 40 Mg Tabs (Furosemide) .... 1/2 By Mouth Daily 4)  Pepcid 20 Mg Tabs (Famotidine) .... One By Mouth Two Times A Day 5)  Lisinopril 2.5 Mg Tabs (Lisinopril) .... One By Mouth Daily 6)  Bayer Low Strength 81 Mg Tbec (Aspirin) .... One By Mouth Daily 7)  Carvedilol 12.5 Mg Tabs (Carvedilol) .... Take 1 Tablet By Mouth Twice A Day 8)  Nitroglycerin 0.4 Mg Subl (Nitroglycerin) .... Place 1 Tablet Under Tongue As Directed 9)  K-Lor 20 Meq Pack (Potassium Chloride) .... Take 1 Tab Daily 10)  Motrin Ib 200 Mg Tabs (Ibuprofen) .... As Needed  Allergies (verified): 1)  ! 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 Unmarried with no children. Tobacco Use - Former.   Clinical Review Panels:  Echocardiogram Echocardiogram   SUMMARY   -  There is akinesis at the base of the inferior wall and the base         of the posterior wall. The is a dyskinetic type of movement         of part of the apex. The other walls move well. Overall left         ventricular systolic function was mildly to moderately         decreased. Left ventricular ejection fraction was estimated         to be 45 %. Left ventricular wall thickness was mildly to         moderately increased.   -  There was mild mitral valvular regurgitation. The effective         orifice of mitral regurgitation by proximal isovelocity         surface area was 0.2 cm^2. The volume of mitral regurgitation         by proximal isovelocity surface area was 33 cc.   -  Left atrial size was at the upper limits of normal.   -  There was the appearance of a  catheter or pacing wire in the         right ventricle. (03/20/2009)    Review of Systems       The patient complains of weight gain and chest pain.  The patient denies anorexia, fever, syncope, dyspnea on exertion, peripheral edema, prolonged cough, abdominal pain, melena, and hematochezia.         Otherwise reviewed and negative except as outlined.  Vital Signs:  Patient profile:   50 year old female Weight:      191 pounds BMI:     27.50 Pulse rate:   55 / minute BP sitting:   108 / 53  (right arm)  Vitals Entered By: Dreama Saa, CNA (May 29, 2010 3:18 PM)  Physical Exam  Additional Exam:  Overweight woman in no acute distress without  active chest pain. HEENT: Conjunctiva and lids are normal, oropharynx clear. Neck: Supple no loud carotid bruits, very soft bruit on the right. Lungs: Clear to auscultation without labored breathing. Cardiac: Regular rate and rhythm, no S3 gallop. PMI indistinct. No loud systolic murmur. Abdomen: Soft nontender, no hepatomegalyl. Bowel sounds present. Extremities: Diminished dorsalis pedis pulses bilaterally. No pitting edema. Musculoskeletal: No kyphosis noted. Neuropsychiatric: Patient alert and oriented x3, affect is grossly normal.   Carotid Doppler  Procedure date:  01/26/2010  Findings:      IMPRESSION: The right internal carotid artery appears be occluded just beyond its origin.   Atherosclerotic disease involving the carotid arteries bilaterally. Estimated degree of stenosis in the left internal carotid artery is less than 50%.   ICD Specifications Following MD:  Inactive     ICD Vendor:  [employer]     ICD Model Number:  EO30     ICD Serial Number:  [id number] ICD DOI:  11/18/2007      Lead 1:    Location: RA     DOI: 11/18/2007     Model #: [id number]     Serial #: [id number]     Status: active Lead 2:    Location: RV     DOI: 11/18/2007     Model #: [id number]     Serial #: [id number]     Status: active  Indications::  CM  Explantation Comments: 10-16-09 No response to 2 letters and the certified letter was unclaimed.  Patient made inactive for device follow-up. Counselling psychologist RN BSN  October 16, 2009 2:28 PM   Episodes Coumadin:  Yes  Brady Parameters Mode DDD     Lower Rate Limit:  50     Upper Rate Limit 125 PAV 250      Tachy Zones VF:  200     VT:  170     Impression & Recommendations:  Problem # 1:  CHEST PAIN (ICD-786.50)  Patient reports chest pain, both typical and atypical features. Patient reports it is not entirely like her previous angina, and wonders whether it could be gastrointestinal. She does report some response to nitroglycerin however. She has not  undergone any recent ischemic testing. We will arrange a followup Lexiscan Myoview on medical therapy. If overall reassuring without any substantial ischemic burden, would anticipate continued medical therapy and observation over the next 6 months. Otherwise we can review further.  Her updated medication list for this problem includes:    Warfarin Sodium 10 Mg Tabs (Warfarin sodium) ..... One by mouth daily    Lisinopril 2.5 Mg Tabs (Lisinopril) ..... One by mouth daily    Bayer Low Strength 81  Mg Tbec (Aspirin) ..... One by mouth daily    Carvedilol 12.5 Mg Tabs (Carvedilol) .Marland Kitchen... Take 1 tablet by mouth twice a day    Nitroglycerin 0.4 Mg Subl (Nitroglycerin) .Marland Kitchen... Place 1 tablet under tongue as directed  Orders: Nuclear Stress Test (Nuc Stress Test)  Problem # 2:  CARDIOMYOPATHY, ISCHEMIC (ICD-414.8)  Patient appears euvolemic on medical therapy with history of cardiomyopathy, LVEF improved to approximately 45% on medical therapy. She has a Environmental manager ICD, followed through our device clinic. She denies any further shocks. Her updated medication list for this problem includes:     Digoxin 0.25 Mg Tabs (Digoxin) ..... One by mouth daily    Warfarin Sodium 10 Mg Tabs (Warfarin sodium) ..... One by mouth daily    Furosemide 40 Mg Tabs (Furosemide) .Marland Kitchen... 1/2 by mouth daily    Lisinopril 2.5 Mg Tabs (Lisinopril) ..... One by mouth daily    Bayer Low Strength 81 Mg Tbec (Aspirin) ..... One by mouth daily    Carvedilol 12.5 Mg Tabs (Carvedilol) .Marland Kitchen... Take 1 tablet by mouth twice a day    Nitroglycerin 0.4 Mg Subl (Nitroglycerin) .Marland Kitchen... Place 1 tablet under tongue as directed  Problem # 3:  ESSENTIAL HYPERTENSION, BENIGN (ICD-401.1)  Overall blood pressure is well controlled. She does have occasional hypotensive events, and we have gradually backed off on medical therapy. It could be that her concurrent intentional weight loss has led to general improvement in blood pressure control. I have  asked her to keep an eye on this.  Her updated medication list for this problem includes:    Furosemide 40 Mg Tabs (Furosemide) .Marland Kitchen... 1/2 by mouth daily    Lisinopril 2.5 Mg Tabs (Lisinopril) ..... One by mouth daily    Bayer Low Strength 81 Mg Tbec (Aspirin) ..... One by mouth daily    Carvedilol 12.5 Mg Tabs (Carvedilol) .Marland Kitchen... Take 1 tablet by mouth twice a day  Patient Instructions: 1)  Your physician recommends that you schedule a follow-up appointment in: 6 months 2)  Your physician recommends that you continue on your current medications as directed. Please refer to the Current Medication list given to you today. 3)  Your physician has requested that you have an [employer]  For further information please visit [email or web address]  Please follow instruction sheet, as given.

## 2011-01-29 NOTE — Letter (Signed)
Summary: [facility] Results Engineer, agricultural at [facility]  [address] [place] [address]   Phone: [phone]  Fax: [phone]      June 25, 2010 MRN: [id number]   [name] [name] [address] [place], [place]  [address]   Dear Ms. Salsberry,  Your test ordered by Selena Batten has been reviewed by your physician (or physician assistant) and was found to be normal or stable. Your physician (or physician assistant) felt no changes were needed at this time.  ____ Echocardiogram  __X__ Cardiac Stress Test  ____ Lab Work  ____ Peripheral vascular study of arms, legs or neck  ____ CT scan or X-ray  ____ Lung or Breathing test  ____ Other: Please continue on current medical treatment.  Thank you.   Nona Dell, MD, F.A.C.C

## 2011-01-29 NOTE — Letter (Signed)
Summary: Custom - Delinquent Coumadin 2  [facility]  [address], [place] [address]   Phone: [phone]  Fax: [phone]     November 16, 2009 MRN: [id number]   [name] [name] [address] [place], [place]  [address]   Dear Ms. Armand,  We have attempted to contact you by phone and letter on multiple occasions to contact our office for important blood work associated with the blood thinner, warfarin (Coumadin).  Warfarin is a very important drug that can cause life threatening side effects including, bleeding, and thus requires close laboratory monitoring.  We are unable to accept responsibility for blood thinner-related health problems you may develop because you have not followed our recommendations for appropriate monitoring.  These may include abnormal bleeding occurrences and/or development of blood clots (stroke, heart attack, blood clots in legs or lungs, etc.).  We need for you to contact this office at the number listed above to schedule and complete this very important blood work.  Thank you for your assistance in this urgent matter.  Sincerely, [name] RN [facility] Team  If you are having your coumadin managed by another physician please let our office know.  If not, we will no longer be able to provide your coumadin if you do not have your blood work checked regularly.

## 2011-01-29 NOTE — Miscellaneous (Signed)
Summary: Orders Update/Labs: Lipid's  Clinical Lists Changes  Problems: Added new problem of HYPERLIPIDEMIA (ICD-272.4) Orders: Added new Test order of T-Lipid Profile [phone]) - Signed

## 2011-01-29 NOTE — Letter (Signed)
Summary: Appointment - Missed  [facility] [place]  [address] [place] [address]   Phone: [phone]  Fax: [phone]     August 09, 2010 MRN: [id number]   [name] [name] [address] [place], [place]  [address]   Dear [name],  Our records indicate you missed your appointment on          08/09/10 COUMADIN CLINIC               It is very important that we reach you to reschedule this appointment. We look forward to participating in your health care needs. Please contact us at the number listed above at your earliest convenience to reschedule this appointment.     Sincerely,    Glass blower/designer

## 2011-01-29 NOTE — Miscellaneous (Signed)
Summary: INACTIVE ICD PATIENT  Clinical Lists Changes  Observations: Added new observation of [employer]: 10-16-09 No response to 2 letters and the certified letter was unclaimed.  Patient made inactive for device follow-up. Gypsy Balsam RN BSN  October 16, 2009 2:28 PM  (10/16/2009 14:27) Added new observation of ICD MD: Inactive (10/16/2009 14:27)       ICD Specifications Following MD:  Inactive     ICD Vendor:  Boston Scientific     ICD Model Number:  EO30     ICD Serial Number:  [id number] ICD DOI:  11/18/2007      Lead 1:    Location: RA     DOI: 11/18/2007     Model #: 4136     Serial #: [id number]     Status: active Lead 2:    Location: RV     DOI: 11/18/2007     Model #: 5284     Serial #: [id number]     Status: active  Indications::  CM  Explantation Comments: 10-16-09 No response to 2 letters and the certified letter was unclaimed.  Patient made inactive for device follow-up. Gypsy Balsam RN BSN  October 16, 2009 2:28 PM   Episodes Coumadin:  Yes  Brady Parameters Mode DDD     Lower Rate Limit:  50     Upper Rate Limit 125 PAV 250      Tachy Zones VF:  200     VT:  170

## 2011-01-29 NOTE — Medication Information (Signed)
Summary: ccr-lr  Anticoagulant Therapy  Managed by: [name], RN PCP: Dr. Kari Baars Supervising MD: Diona Browner MD, Remi Deter Indication 1: Abnormal Coagulation Profile (ICD-790.92) Indication 2: stroke (ICD-436.0) Lab Used: [facility] [place] Site: [place] INR POC 2.6  Dietary changes: no    Health status changes: no    Bleeding/hemorrhagic complications: no    Recent/future hospitalizations: no    Any changes in medication regimen? no    Recent/future dental: no  Any missed doses?: no       Is patient compliant with meds? yes       Allergies: 1)  ! Pcn  Anticoagulation Management History:      The patient is taking warfarin and comes in today for a routine follow up visit.  Negative risk factors for bleeding include an age less than 46 years old.  The bleeding index is 'low risk'.  Positive CHADS2 values include History of HTN.  Negative CHADS2 values include Age > 64 years old.  The start date was 12/16/2007.  Her last INR was 2.19.  Anticoagulation responsible provider: Diona Browner MD, Remi Deter.  INR POC: 2.6.  Cuvette Lot#: [id number].    Anticoagulation Management Assessment/Plan:      The patient's current anticoagulation dose is Warfarin sodium 10 mg tabs: one by mouth daily.  The target INR is 2 - 3.  The next INR is due 08/09/2010.  Anticoagulation instructions were given to patient.  Results were reviewed/authorized by [name], RN.  She was notified by [name] RN.         Prior Anticoagulation Instructions: INR 3.0 Continue coumadin 10mg  once daily except 5mg  on Sundays, Tuesdays and Thursdays  Current Anticoagulation Instructions: INR 2.6 Continue coumadin 10mg  once daily except 5mg  on Sundays, Tuesdays and Thursdays

## 2011-01-29 NOTE — Medication Information (Signed)
Summary: ccr-lr  Anticoagulant Therapy  Managed by: [name], RN PCP: Dr. Kari Baars Supervising MD: Eden Emms MD, Theron Arista Indication 1: Abnormal Coagulation Profile (ICD-790.92) Indication 2: stroke (ICD-436.0) Lab Used: [facility] [place] Site: [place] INR POC 3.5  Dietary changes: no    Health status changes: yes       Details: Sinus infection  Bleeding/hemorrhagic complications: no    Recent/future hospitalizations: no    Any changes in medication regimen? yes       Details: started on Z-pack on Tuesday by Dr [name]  Recent/future dental: no  Any missed doses?: no       Is patient compliant with meds? yes       Allergies: 1)  ! Pcn  Anticoagulation Management History:      The patient is taking warfarin and comes in today for a routine follow up visit.  Negative risk factors for bleeding include an age less than 66 years old.  The bleeding index is 'low risk'.  Negative CHADS2 values include Age > 75 years old.  The start date was 12/16/2007.  Anticoagulation responsible provider: Eden Emms MD, Theron Arista.  INR POC: 3.5.  Cuvette Lot#: [id number].    Anticoagulation Management Assessment/Plan:      The patient's current anticoagulation dose is Warfarin sodium 10 mg tabs: one by mouth daily.  The target INR is 2 - 3.  The next INR is due 02/14/2010.  Anticoagulation instructions were given to patient.  Results were reviewed/authorized by [name], RN.  She was notified by [name] RN.         Prior Anticoagulation Instructions: INR 3.1 continue coumadin 10mg  once daily except 5mg  on Wednesdays  Current Anticoagulation Instructions: INR 3.5 Hold coumadin tonight then decrease dose to 5mg  on Mondays and Fridays starting next week.

## 2011-01-29 NOTE — Medication Information (Signed)
Summary: ccr-lr/pt having stress test done @ 1100/tg  Anticoagulant Therapy  Managed by: [name], RN PCP: Dr. Kari Baars Supervising MD: Dietrich Pates MD, Molly Maduro Indication 1: Abnormal Coagulation Profile (ICD-790.92) Indication 2: stroke (ICD-436.0) Lab Used: [facility] [place] Site: [place] INR POC 3.0  Dietary changes: no    Health status changes: no    Bleeding/hemorrhagic complications: no    Recent/future hospitalizations: no    Any changes in medication regimen? no    Recent/future dental: no  Any missed doses?: no       Is patient compliant with meds? yes       Allergies: 1)  ! Pcn  Anticoagulation Management History:      The patient is taking warfarin and comes in today for a routine follow up visit.  Negative risk factors for bleeding include an age less than 48 years old.  The bleeding index is 'low risk'.  Positive CHADS2 values include History of HTN.  Negative CHADS2 values include Age > 48 years old.  The start date was 12/16/2007.  Her last INR was 2.19.  Anticoagulation responsible provider: Dietrich Pates MD, Molly Maduro.  INR POC: 3.0.  Cuvette Lot#: [id number].    Anticoagulation Management Assessment/Plan:      The patient's current anticoagulation dose is Warfarin sodium 10 mg tabs: one by mouth daily.  The target INR is 2 - 3.  The next INR is due 07/11/2010.  Anticoagulation instructions were given to patient.  Results were reviewed/authorized by [name], RN.  She was notified by [name] RN.         Prior Anticoagulation Instructions: INR 2.19 Pt to continue coumadin 10mg  once daily except 5mg  on S,T,Th  Current Anticoagulation Instructions: INR 3.0 Continue coumadin 10mg  once daily except 5mg  on Sundays, Tuesdays and Thursdays

## 2011-01-29 NOTE — Letter (Signed)
Summary: Device-Delinquent Check  [facility], Main Office  [address]   [place], [place] [address]   Phone: [phone]  Fax: [phone]     August 11, 2009 MRN: [id number]   [name] [name] [address] [place], [place]  [address]   Dear Ms. Bartunek,  According to our records, you have not had your implanted device checked in the recommended period of time.  We are unable to determine appropriate device function without checking your device on a regular basis.  Please call our office to schedule an appointment as soon as possible.  If you are having your device checked by another physician, please call us so that we may update our records.  Thank you,   Architectural technologist Device Clinic

## 2011-01-29 NOTE — Medication Information (Signed)
Summary: Coumadin Clinic  Coumadin Clinic   Imported By: Dreama Saa, CNA 08/16/2009 09:29:54  _____________________________________________________________________  External Attachment:    Type:   Image     Comment:   External Document

## 2011-01-29 NOTE — Cardiovascular Report (Signed)
Summary: Certified Letter - Notice Left  Certified Letter - Notice Left   Imported By: Debby Freiberg 10/29/2010 11:57:10  _____________________________________________________________________  External Attachment:    Type:   Image     Comment:   External Document

## 2011-01-29 NOTE — Miscellaneous (Signed)
**Note De-Identified Narcissus Detwiler Obfuscation** Summary: Labs: Lipid and LFT's  Clinical Lists Changes  Observations: Added new observation of ALBUMIN: 3.9 g/dL (16/09/9603 5:40) Added new observation of PROTEIN, TOT: 6.8 g/dL (98/10/9146 8:29) Added new observation of SGPT (ALT): 19 units/L (05/19/2009 9:06) Added new observation of SGOT (AST): 17 units/L (05/19/2009 9:06) Added new observation of ALK PHOS: 59 units/L (05/19/2009 9:06) Added new observation of BILI DIRECT: 0.1 mg/dL (56/21/3086 5:78) Added new observation of K SERUM: 4.3 meq/L (05/19/2009 9:06) Added new observation of LDL: 101 mg/dL (46/96/2952 8:41) Added new observation of HDL: 33 mg/dL (32/44/0102 7:25) Added new observation of TRIGLYC TOT: 153 mg/dL (36/64/4034 7:42) Added new observation of CHOLESTEROL: 165 mg/dL (59/56/3875 6:43)

## 2011-01-29 NOTE — Assessment & Plan Note (Signed)
Summary: Cardiology Device Clinic    Current Medications (verified): 1)  Digoxin 0.25 Mg Tabs (Digoxin) .... One By Mouth Daily 2)  Coreg 12.5 Mg Tabs (Carvedilol) .... One By Mouth Two Times A Day 3)  Warfarin Sodium 10 Mg Tabs (Warfarin Sodium) .... One By Mouth Daily 4)  Furosemide 40 Mg Tabs (Furosemide) .... 1/2 By Mouth Daily 5)  Pepcid 20 Mg Tabs (Famotidine) .... One By Mouth Two Times A Day 6)  Lisinopril 2.5 Mg Tabs (Lisinopril) .... One By Mouth Daily 7)  Zetia 10 Mg Tabs (Ezetimibe) .... One By Mouth Daily 8)  Fish Oil 1000 Mg Caps (Omega-3 Fatty Acids) .... One By Mouth Two Times A Day 9)  Bayer Low Strength 81 Mg Tbec (Aspirin) .... One By Mouth Daily 10)  Daily-Vitamin  Tabs (Multiple Vitamin) .... One By Mouth Daily  Allergies (verified): 1)  ! Pcn  ICD Specifications ICD Vendor:  [employer]     ICD Model Number:  EO30     ICD Serial Number:  [id number] ICD DOI:  11/18/2007      Lead 1:    Location: RA     DOI: 11/18/2007     Model #: [id number]     Serial #: [id number]     Status: active Lead 2:    Location: RV     DOI: 11/18/2007     Model #: 1517     Serial #: [id number]     Status: active  Indications::  CM   ICD Specs Remote Check?  No Battery Voltage:  2.99 V     Charge Time:  9.5 seconds     Battery Est. Longevity:  BOL Underlying rhythm:  SR@77    ICD Device Measurements Atrium:  Amplitude: .9 mV, Impedance: 595 ohms, Threshold: .6 V at .5 msec Right Ventricle:  Amplitude: 22.1 mV, Impedance: 542 ohms, Threshold: .8 V at .5 msec Left Ventricle:  Shock Impedance: 49 ohms   Episodes MS Episodes:  0     Coumadin:  Yes Shock:  0     ATP:  0     Nonsustained:  0     Atrial Pacing:  0     Ventricular Pacing:  0  Brady Parameters Mode DDD     Lower Rate Limit:  50     Upper Rate Limit 125 PAV 250      Tachy Zones VF:  200     VT:  170     Tech Comments:  No  changes Latitude Altha Harm, LPN  April 20, 2009 2:54 PM

## 2011-01-29 NOTE — Progress Notes (Signed)
Summary: chipped tooth/needs answer today before dentist appt.  Phone Note Call from Patient Call back at [phone] or [phone]   Caller: pt Reason for Call: Talk to Nurse Summary of Call: pt chipped tooth and needs to go to dentist to get fixed does she need to be on antibiotics or anything please let her know asap she has appt today Initial call taken by: Faythe Ghee,  January 18, 2010 9:19 AM  Follow-up for Phone Call        Patient states that because she has pacemaker and metal in her back her dentist needs to know if she should take antibiotic before dentist visit today. She has never needed this in the past. Please advise. Follow-up by: Larita Fife Via LPN,  January 18, 2010 9:43 AM  Additional Follow-up for Phone Call Additional follow up Details #1::        Speaking specifically about her ICD, she does not require anitbiotics for routine dental procedures based on current guidelines. Additional Follow-up by: Loreli Slot, MD, [facility]  January 18, 2010 11:31 AM    Additional Follow-up for Phone Call Additional follow up Details #2::    Patient advised, she states she understands and will call PCP concerning the metal in her back.  Follow-up by: Larita Fife Via LPN,  January 18, 2010 12:03 PM

## 2011-01-29 NOTE — Progress Notes (Signed)
Summary: coumadin management  Phone Note Call from Patient Call back at [phone]   Caller: Patient Summary of Call: pt states that PT / INR was checked in ER on 05/14/10/ pls call pt at above number and leave message with instructions/tg Initial call taken by: Raechel Ache [facility],  May 15, 2010 2:41 PM  Follow-up for Phone Call        PT/INR drawn in ED on 05/14/10.  PT 24.2  INR 2.19  Pt told to continue coumadin 10mg  once daily except 5mg  on S,T,Th and have INR rechecked in office on 06/11/10 Follow-up by: [name] RN,  May 15, 2010 4:07 PM     Anticoagulant Therapy  Managed by: [name], RN PCP: Dr. Kari Baars Supervising MD: Diona Browner MD, Remi Deter Indication 1: Abnormal Coagulation Profile (ICD-790.92) Indication 2: stroke (ICD-436.0) Lab Used: [facility] [place] Site: [place] PT 24.2  Dietary changes: no    Health status changes: yes       Details: pt felt dizzy with double vision and was sent to ED  Bleeding/hemorrhagic complications: no    Recent/future hospitalizations: no    Any changes in medication regimen? no    Recent/future dental: no  Any missed doses?: no       Is patient compliant with meds? yes         Anticoagulation Management History:      Her anticoagulation is being managed by telephone today.  Negative risk factors for bleeding include an age less than 51 years old.  The bleeding index is 'low risk'.  Negative CHADS2 values include Age > 73 years old.  The start date was 12/16/2007.  Today's INR is 2.19.  Prothrombin time is 24.2.  Anticoagulation responsible provider: Diona Browner MD, Remi Deter.  Cuvette Lot#: [id number].    Anticoagulation Management Assessment/Plan:      The patient's current anticoagulation dose is Warfarin sodium 10 mg tabs: one by mouth daily.  The target INR is 2 - 3.  The next INR is due 06/11/2010.  Anticoagulation instructions were given to patient.  Results were reviewed/authorized by [name], RN.  She was notified by [name] RN.         Prior Anticoagulation Instructions: INR 2.8 Continue coumadin 10mg  once daily except 5mg  on Sundays, Tuesdays and Thursdays  Current Anticoagulation Instructions: INR 2.19 Pt to continue coumadin 10mg  once daily except 5mg  on S,T,Th

## 2011-01-29 NOTE — Medication Information (Signed)
Summary: CCR  Anticoagulant Therapy  Managed by: [name], RN PCP: Dr. Kari Baars Supervising MD: Daleen Squibb MD, Maisie Fus Indication 1: Abnormal Coagulation Profile (ICD-790.92) Indication 2: stroke (ICD-436.0) Lab Used: [facility] [place] Site: [place] INR POC 4.3  Dietary changes: no    Health status changes: no    Bleeding/hemorrhagic complications: no    Recent/future hospitalizations: no    Any changes in medication regimen? no    Recent/future dental: no  Any missed doses?: no       Is patient compliant with meds? yes       Allergies: 1)  ! Pcn  Anticoagulation Management History:      The patient is taking warfarin and comes in today for a routine follow up visit.  Negative risk factors for bleeding include an age less than 42 years old.  The bleeding index is 'low risk'.  Negative CHADS2 values include Age > 48 years old.  The start date was 12/16/2007.  Anticoagulation responsible provider: Daleen Squibb MD, Maisie Fus.  INR POC: 4.3.  Cuvette Lot#: [id number].    Anticoagulation Management Assessment/Plan:      The patient's current anticoagulation dose is Warfarin sodium 10 mg tabs: one by mouth daily.  The target INR is 2 - 3.  The next INR is due 03/19/2010.  Anticoagulation instructions were given to patient.  Results were reviewed/authorized by [name], RN.  She was notified by [name] RN.         Prior Anticoagulation Instructions: INR 3.5 Hold coumadin tonight then decrease dose to 5mg  on Mondays and Fridays starting next week.   Current Anticoagulation Instructions: INR 4.3 Hold coumadin tonight then decrease dose to 5mg  once daily except 10mg  on Mondays, Wednesdays and Fridays

## 2011-01-29 NOTE — Letter (Signed)
Summary: Device-Delinquent Check  [facility], Main Office  [address]   [place], [place] [address]   Phone: [phone]  Fax: [phone]     September 28, 2010 MRN: [id number]   BRIANDA BEITLER [address] [place], [place]  [address]   Dear Ms. Rawlins,  According to our records, you have not had your implanted device checked in the recommended period of time.  We are unable to determine appropriate device function without checking your device on a regular basis.  Please call our office to schedule an appointment with Dr Ladona Ridgel, as soon as possible.  If you are having your device checked by another physician, please call us so that we may update our records.  Thank you,  Letta Moynahan, EMT  September 28, 2010 12:32 PM  [facility]  Certified

## 2011-01-29 NOTE — Medication Information (Signed)
Summary: ccr/sn  Anticoagulant Therapy  Managed by: [name], RN PCP: Dr. Kari Baars Supervising MD: Dietrich Pates MD, Molly Maduro Indication 1: Abnormal Coagulation Profile (ICD-790.92) Indication 2: stroke (ICD-436.0) Lab Used: [facility] [place] Site: [place] INR POC 3.0  Dietary changes: no    Health status changes: no    Bleeding/hemorrhagic complications: no    Recent/future hospitalizations: no    Any changes in medication regimen? no    Recent/future dental: no  Any missed doses?: no       Is patient compliant with meds? yes       Allergies: 1)  ! Pcn  Anticoagulation Management History:      The patient is taking warfarin and comes in today for a routine follow up visit.  Negative risk factors for bleeding include an age less than 60 years old.  The bleeding index is 'low risk'.  Positive CHADS2 values include History of HTN.  Negative CHADS2 values include Age > 28 years old.  The start date was 12/16/2007.  Her last INR was 2.19.  Anticoagulation responsible provider: Dietrich Pates MD, Molly Maduro.  INR POC: 3.0.    Anticoagulation Management Assessment/Plan:      The patient's current anticoagulation dose is Warfarin sodium 10 mg tabs: one by mouth daily.  The target INR is 2 - 3.  The next INR is due 10/01/2010.  Anticoagulation instructions were given to patient.  Results were reviewed/authorized by [name], RN.  She was notified by [name] RN.         Prior Anticoagulation Instructions: INR 2.6 Continue coumadin 10mg  once daily except 5mg  on Sundays, Tuesdays and Thursdays  Current Anticoagulation Instructions: INR 3.0 Continue coumadin 10mg  once daily except 5mg  on Sundays, Tuesdays and Thursdays

## 2011-01-29 NOTE — Cardiovascular Report (Signed)
Summary: Office Visit  Office Visit   Imported By: Marylou Mccoy 05/08/2009 11:52:13  _____________________________________________________________________  External Attachment:    Type:   Image     Comment:   External Document

## 2011-01-29 NOTE — Medication Information (Signed)
Summary: ccr-lr  Anticoagulant Therapy  Managed by: [name], RN PCP: Dr. Kari Baars Supervising MD: Dietrich Pates MD, Molly Maduro Indication 1: Abnormal Coagulation Profile (ICD-790.92) Indication 2: stroke (ICD-436.0) Lab Used: [facility] [place] Site: [place] INR POC 2.8   Health status changes: yes       Details: bilateral ear infections  Bleeding/hemorrhagic complications: no    Recent/future hospitalizations: no    Any changes in medication regimen? yes       Details: started on Bactrim DS 800mg  bid x 15 days  Started on 04/24/10  Recent/future dental: no  Any missed doses?: no       Is patient compliant with meds? yes       Allergies: 1)  ! Pcn  Anticoagulation Management History:      The patient is taking warfarin and comes in today for a routine follow up visit.  Negative risk factors for bleeding include an age less than 92 years old.  The bleeding index is 'low risk'.  Negative CHADS2 values include Age > 61 years old.  The start date was 12/16/2007.  Anticoagulation responsible provider: Dietrich Pates MD, Molly Maduro.  INR POC: 2.8.  Cuvette Lot#: [id number].    Anticoagulation Management Assessment/Plan:      The patient's current anticoagulation dose is Warfarin sodium 10 mg tabs: one by mouth daily.  The target INR is 2 - 3.  The next INR is due 05/14/2010.  Anticoagulation instructions were given to patient.  Results were reviewed/authorized by [name], RN.  She was notified by [name] RN.         Prior Anticoagulation Instructions: INR 2.0 Increase coumadin 10mg  once daily except 5mg  on Sundays, Tuesdays and Thursdays  Current Anticoagulation Instructions: INR 2.8 Continue coumadin 10mg  once daily except 5mg  on Sundays, Tuesdays and Thursdays

## 2011-01-29 NOTE — Letter (Signed)
Summary: Appointment - Reminder 2  [facility] [address]   [place], [place] [address]   Phone: [phone]  Fax: [phone]     December 07, 2010 MRN: [id number]   [name] [name] [address] [place], [place]  [address]   Dear [name] [name],  Our records indicate that it is time to schedule a follow-up appointment.  Dr.  Diona Browner        recommended that you follow up with [place] in    12.2011        . It is very important that we reach you to schedule this appointment. We look forward to participating in your health care needs. Please contact us at the number listed above at your earliest convenience to schedule your appointment.  If you are unable to make an appointment at this time, give [place] a call so we can update our records.     Sincerely,   Glass blower/designer

## 2011-01-31 NOTE — Cardiovascular Report (Signed)
Summary: Certified Letter - Returned   Certified Letter - Returned   Imported By: Debby Freiberg 12/21/2010 14:04:11  _____________________________________________________________________  External Attachment:    Type:   Image     Comment:   External Document

## 2011-02-25 ENCOUNTER — Encounter: Payer: Self-pay | Admitting: Cardiology

## 2011-02-25 ENCOUNTER — Encounter (INDEPENDENT_AMBULATORY_CARE_PROVIDER_SITE_OTHER): Payer: PRIVATE HEALTH INSURANCE

## 2011-02-25 ENCOUNTER — Other Ambulatory Visit: Payer: Self-pay | Admitting: Cardiology

## 2011-02-25 ENCOUNTER — Ambulatory Visit (INDEPENDENT_AMBULATORY_CARE_PROVIDER_SITE_OTHER): Payer: PRIVATE HEALTH INSURANCE | Admitting: Cardiology

## 2011-02-25 DIAGNOSIS — I6789 Other cerebrovascular disease: Secondary | ICD-10-CM

## 2011-02-25 DIAGNOSIS — Z7901 Long term (current) use of anticoagulants: Secondary | ICD-10-CM

## 2011-02-25 DIAGNOSIS — E782 Mixed hyperlipidemia: Secondary | ICD-10-CM

## 2011-02-25 DIAGNOSIS — I2589 Other forms of chronic ischemic heart disease: Secondary | ICD-10-CM

## 2011-02-25 DIAGNOSIS — I6529 Occlusion and stenosis of unspecified carotid artery: Secondary | ICD-10-CM

## 2011-02-25 DIAGNOSIS — R791 Abnormal coagulation profile: Secondary | ICD-10-CM

## 2011-02-25 DIAGNOSIS — F17200 Nicotine dependence, unspecified, uncomplicated: Secondary | ICD-10-CM

## 2011-02-25 LAB — CONVERTED CEMR LAB: POC INR: 2

## 2011-02-26 ENCOUNTER — Encounter: Payer: Self-pay | Admitting: Cardiology

## 2011-02-27 ENCOUNTER — Encounter: Payer: Self-pay | Admitting: Cardiology

## 2011-02-28 ENCOUNTER — Ambulatory Visit (HOSPITAL_COMMUNITY)
Admission: RE | Admit: 2011-02-28 | Discharge: 2011-02-28 | Disposition: A | Discharge status: Home / Self Care | Payer: PRIVATE HEALTH INSURANCE | Source: Ambulatory Visit | Attending: Cardiology | Admitting: Cardiology

## 2011-02-28 ENCOUNTER — Encounter: Payer: Self-pay | Admitting: Cardiology

## 2011-02-28 DIAGNOSIS — I2510 Atherosclerotic heart disease of native coronary artery without angina pectoris: Secondary | ICD-10-CM | POA: Insufficient documentation

## 2011-02-28 DIAGNOSIS — I059 Rheumatic mitral valve disease, unspecified: Secondary | ICD-10-CM

## 2011-02-28 DIAGNOSIS — Z8673 Personal history of transient ischemic attack (TIA), and cerebral infarction without residual deficits: Secondary | ICD-10-CM | POA: Insufficient documentation

## 2011-02-28 DIAGNOSIS — I6529 Occlusion and stenosis of unspecified carotid artery: Secondary | ICD-10-CM | POA: Insufficient documentation

## 2011-02-28 LAB — CONVERTED CEMR LAB
ALT: 16 units/L (ref 0–35)
AST: 18 units/L (ref 0–37)
Albumin: 4.1 g/dL (ref 3.5–5.2)
Alkaline Phosphatase: 78 units/L (ref 39–117)
Bilirubin, Direct: 0.1 mg/dL (ref 0.0–0.3)
Cholesterol: 180 mg/dL (ref 0–200)
HDL: 42 mg/dL (ref >39)
Indirect Bilirubin: 0.3 mg/dL (ref 0.0–0.9)
LDL Cholesterol: 122 mg/dL — ABNORMAL HIGH (ref 0–99)
Total Bilirubin: 0.4 mg/dL (ref 0.3–1.2)
Total CHOL/HDL Ratio: 4.3
Total Protein: 7.8 g/dL (ref 6.0–8.3)
Triglycerides: 80 mg/dL (ref <150)
VLDL: 16 mg/dL (ref 0–40)

## 2011-03-07 NOTE — Letter (Signed)
Summary: [place] [facility]  [address], [place] [address]   Phone: [phone]  Fax: [phone]      February 28, 2011 MRN: [id number]   [name] [name] [address] [place], [place]  [address]   Dear [name],  Your test ordered by Selena Batten has been reviewed by your physician (or physician assistant) and was found to be normal or stable. Your physician (or physician assistant) felt no changes were needed at this time.  __X__ Echocardiogram  ____ Cardiac Stress Test  ____ Lab Work  __X__ Peripheral vascular study of arms, legs or neck  ____ CT scan or X-ray  ____ Lung or Breathing test  ____ Other:   Thank you.   Nona Dell, MD, F.A.C.C

## 2011-03-07 NOTE — Medication Information (Signed)
Summary: ccr past due she now has insurance  Anticoagulant Therapy  Managed by: [name], RN PCP: Dr. Kari Baars Supervising MD: Diona Browner MD, Remi Deter Indication 1: Abnormal Coagulation Profile (ICD-790.92) Indication 2: stroke (ICD-436.0) Lab Used: [facility] [place] Site: [place] INR POC 2.0  Dietary changes: no    Health status changes: yes       Details: sinus  infection  Bleeding/hemorrhagic complications: no    Recent/future hospitalizations: no    Any changes in medication regimen? yes       Details: Has had z pack once each month for sinus infection  Recent/future dental: no  Any missed doses?: no       Is patient compliant with meds? yes       Allergies: 1)  ! Pcn  Anticoagulation Management History:      The patient is taking warfarin and comes in today for a routine follow up visit.  Negative risk factors for bleeding include an age less than 48 years old.  The bleeding index is 'low risk'.  Positive CHADS2 values include History of HTN.  Negative CHADS2 values include Age > 48 years old.  The start date was 12/16/2007.  Her last INR was 2.19.  Anticoagulation responsible provider: Diona Browner MD, Remi Deter.  INR POC: 2.0.  Cuvette Lot#: [id number].    Anticoagulation Management Assessment/Plan:      The patient's current anticoagulation dose is Warfarin sodium 10 mg tabs: one by mouth daily.  The target INR is 2 - 3.  The next INR is due 03/25/2011.  Anticoagulation instructions were given to patient.  Results were reviewed/authorized by [name], RN.  She was notified by [name] RN.         Prior Anticoagulation Instructions: INR 3.0 Continue coumadin 10mg  once daily except 5mg  on Sundays, Tuesdays and Thursdays  Current Anticoagulation Instructions: INR 2.0 Continue coumadin 10mg  once daily except 5mg  on Sundays, Tuesdays and Thursdays

## 2011-03-07 NOTE — Assessment & Plan Note (Signed)
Summary: pt was due in dec for f/u/tmj   Visit Type:  Follow-up Primary Provider:  Dr. Kari Baars   History of Present Illness: 50 year old [name] [name] presents for followup. She was last seen in May 2011 and has missed routine followup visits with me, also including the device clinic and Coumadin clinic. She states that it was related to changes in her insurance coverage.  Followup Myoview was arranged after her last visit, done back in June 2011, demonstrating a reported LVEF of 25% with wall motion abnormalities, also extensive scarring in the anterior distribution and base of the posterior lateral wall. No active ischemia was noted however. LVEF had been 45% by echocardiography. I discussed this with her today.  From a symptom perspective, she does report occasional "fluttering" although this is very brief, not associated with syncope. She has had no device shocks. She reports compliance with her medications, with some lapses in Coumadin over time.  She states that she checks her blood pressure at home with systolics generally "112 to 115. "  Continues to work full-time, now for [employer]. She is not exercising regularly. Has gained weight.  Clinical Review Panels:  Echocardiogram Echocardiogram   SUMMARY   -  There is akinesis at the base of the inferior wall and the base         of the posterior wall. The is a dyskinetic type of movement         of part of the apex. The other walls move well. Overall left         ventricular systolic function was mildly to moderately         decreased. Left ventricular ejection fraction was estimated         to be 45 %. Left ventricular wall thickness was mildly to         moderately increased.   -  There was mild mitral valvular regurgitation. The effective         orifice of mitral regurgitation by proximal isovelocity         surface area was 0.2 cm^2. The volume of mitral regurgitation         by proximal isovelocity surface area was 33 cc.  -  Left atrial size was at the upper limits of normal.   -  There was the appearance of a catheter or pacing wire in the         right ventricle. (03/20/2009)    Current Medications (verified): 1)  Digoxin 0.25 Mg Tabs (Digoxin) .... One By Mouth Daily 2)  Warfarin Sodium 10 Mg Tabs (Warfarin Sodium) .... One By Mouth Daily 3)  Furosemide 40 Mg Tabs (Furosemide) .... 1/2 By Mouth Daily 4)  Pepcid 20 Mg Tabs (Famotidine) .... One By Mouth Two Times A Day 5)  Lisinopril 2.5 Mg Tabs (Lisinopril) .... One By Mouth Daily 6)  Bayer Low Strength 81 Mg Tbec (Aspirin) .... One By Mouth Daily 7)  Carvedilol 12.5 Mg Tabs (Carvedilol) .... Take 1 Tablet By Mouth Twice A Day 8)  Nitroglycerin 0.4 Mg Subl (Nitroglycerin) .... Place 1 Tablet Under Tongue As Directed 9)  K-Lor 20 Meq Pack (Potassium Chloride) .... Take 1 Tab Daily 10)  Motrin Ib 200 Mg Tabs (Ibuprofen) .... As Needed  Allergies (verified): 1)  ! 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 Unmarried with no children. Tobacco Use - Former.   Review of Systems       The patient complains of weight gain and dyspnea on exertion.  The patient denies anorexia, fever, chest pain, syncope, peripheral edema, prolonged cough, abdominal pain, melena, and hematochezia.         No reported bleeding problems, no orthopnea or PND. Otherwise  reviewed and negative except as outlined.  Vital Signs:  Patient profile:   50 year old female Weight:      193 pounds BMI:     27.79 Pulse rate:   69 / minute BP sitting:   138 / 82  (left arm)  Vitals Entered By: Dreama Saa, CNA (February 25, 2011 2:29 PM)  Physical Exam  Additional Exam:  Overweight [name] [name] in no acute distress without active chest pain. HEENT: Conjunctiva and lids are normal, oropharynx clear. Neck: Supple no loud carotid bruits, very soft bruit on the right. Lungs: Clear to auscultation without labored breathing. Cardiac: Regular rate and rhythm, no S3 gallop. PMI indistinct. No loud systolic murmur. Abdomen: Soft nontender, no hepatomegalyl. Bowel sounds present. Extremities: Diminished dorsalis pedis pulses bilaterally. No pitting edema. Musculoskeletal: No kyphosis noted. Skin: Warm and dry. Neuropsychiatric: Patient alert and oriented x3, affect is grossly normal.   EKG  Procedure date:  02/25/2011  Findings:      Sinus rhythm at 61 beats per minute with evidence of previous anterior wall infarct, nonspecific ST changes.   ICD Specifications Following MD:  Inactive     ICD Vendor:  [employer]     ICD Model Number:  EO30     ICD Serial Number:  [id number] ICD DOI:  11/18/2007      Lead 1:    Location: RA     DOI: 11/18/2007     Model #: [id number]     Serial #: [id number]     Status: active Lead 2:    Location: RV     DOI: 11/18/2007     Model #: [id number]     Serial #: [id number]     Status: active  Indications::  CM  Explantation Comments: 10-16-09 No response to 2 letters and the certified letter was unclaimed.  Patient made inactive for device follow-up. Gypsy Balsam RN BSN  October 16, 2009 2:28 PM   Episodes Coumadin:  Yes  Brady Parameters Mode DDD     Lower Rate Limit:  50     Upper Rate Limit 125 PAV 250      Tachy Zones VF:  200     VT:  170     Impression & Recommendations:  Problem # 1:  CARDIOMYOPATHY, ISCHEMIC (ICD-414.8)  Evidence of  scar based on Myoview from last June, LVEF 25%. Not clear that this represents a true change in ejection fraction compared to prior echocardiogram. She has been symptomatically stable, working full time, no progressive shortness of breath or orthopnea. I discussed with her maintaining regular followup visits, being compliant with medications. Also goal of basic exercise with weight loss. We will followup a 2-D echocardiogram to reassess LVEF. Otherwise has defibrillator in place, no shocks  or syncope. Followup scheduled over the next 2 months.  Her updated medication list for this problem includes:    Digoxin 0.25 Mg Tabs (Digoxin) ..... One by mouth daily    Warfarin Sodium 10 Mg Tabs (Warfarin sodium) ..... One by mouth daily    Furosemide 40 Mg Tabs (Furosemide) .Marland Kitchen... 1/2 by mouth daily    Lisinopril 2.5 Mg Tabs (Lisinopril) ..... One by mouth daily    Bayer Low Strength 81 Mg Tbec (Aspirin) ..... One by mouth daily    Carvedilol 12.5 Mg Tabs (Carvedilol) .Marland Kitchen... Take 1 tablet by mouth twice a day    Nitroglycerin 0.4 Mg Subl (Nitroglycerin) .Marland Kitchen... 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 Discussed importance of follow up in the Coumadin clinic. INR was checked today.  Problem # 3:  ESSENTIAL HYPERTENSION, BENIGN (ICD-401.1)  Blood pressure elevated today, but patient reports good blood pressure by home checks. Continue to watch.  Her updated medication list for this problem includes:    Furosemide 40 Mg Tabs (Furosemide) .Marland Kitchen... 1/2 by mouth daily    Lisinopril 2.5 Mg Tabs (Lisinopril) ..... One by mouth daily    Bayer Low Strength 81 Mg Tbec (Aspirin) ..... One by mouth daily    Carvedilol 12.5 Mg Tabs (Carvedilol) .Marland Kitchen... Take 1 tablet by mouth twice a day  Problem # 4:  HYPERLIPIDEMIA (ICD-272.4)  No recent followup labs and history of  statin intolerance. Plan to reassess baseline, and can discuss  treatment options again.  Future Orders: T-Lipid Profile [id number]) ... 02/26/2011 T-Hepatic Function [phone]) ... 02/26/2011  Problem # 5:  CEREBROVASCULAR DISEASE (ICD-437.9)  Previous history of stroke and carotid artery disease. Due for followup carotid duplex.  Patient Instructions: 1)  Your physician recommends that you schedule a follow-up appointment in: 2 months 2)  Your physician recommends that you return for lab work in: This week 3)  Your physician has requested that you have a carotid duplex. This test is an ultrasound of the carotid arteries in your neck. It looks at blood flow through these arteries that supply the brain with blood. Allow one hour for this exam. There are no restrictions or special instructions. 4)  Your physician has requested that you have an echocardiogram.  Echocardiography is a painless test that uses sound waves to create images of your heart. It provides your doctor with information about the size and shape of your heart and how well your heart's chambers and valves are working.  This procedure takes approximately one hour. There are no restrictions for this procedure.

## 2011-03-08 ENCOUNTER — Encounter: Payer: Self-pay | Admitting: *Deleted

## 2011-03-18 LAB — CBC
HCT: 38.4 % (ref 36.0–46.0)
Hemoglobin: 12.8 g/dL (ref 12.0–15.0)
MCHC: 33.3 g/dL (ref 30.0–36.0)
MCV: 84.3 fL (ref 78.0–100.0)
Platelets: 213 10*3/uL (ref 150–400)
RBC: 4.56 MIL/uL (ref 3.87–5.11)
RDW: 14.6 % (ref 11.5–15.5)
WBC: 8.4 10*3/uL (ref 4.0–10.5)

## 2011-03-18 LAB — DIFFERENTIAL
Basophils Absolute: 0.1 10*3/uL (ref 0.0–0.1)
Basophils Relative: 1 % (ref 0–1)
Eosinophils Absolute: 0.2 10*3/uL (ref 0.0–0.7)
Eosinophils Relative: 2 % (ref 0–5)
Lymphocytes Relative: 20 % (ref 12–46)
Lymphs Abs: 1.7 10*3/uL (ref 0.7–4.0)
Monocytes Absolute: 0.4 10*3/uL (ref 0.1–1.0)
Monocytes Relative: 5 % (ref 3–12)
Neutro Abs: 6 10*3/uL (ref 1.7–7.7)
Neutrophils Relative %: 72 % (ref 43–77)

## 2011-03-18 LAB — BASIC METABOLIC PANEL
BUN: 14 mg/dL (ref 6–23)
CO2: 31 mEq/L (ref 19–32)
Calcium: 9.3 mg/dL (ref 8.4–10.5)
Chloride: 98 mEq/L (ref 96–112)
Creatinine, Ser: 0.66 mg/dL (ref 0.4–1.2)
GFR calc Af Amer: >60 mL/min (ref >60)
GFR calc non Af Amer: >60 mL/min (ref >60)
Glucose, Bld: 111 mg/dL — ABNORMAL HIGH (ref 70–99)
Potassium: 4.7 mEq/L (ref 3.5–5.1)
Sodium: 138 mEq/L (ref 135–145)

## 2011-03-18 LAB — POCT CARDIAC MARKERS
CKMB, poc: 1 ng/mL (ref 1.0–8.0)
Myoglobin, poc: 12.7 ng/mL (ref 12–200)
Troponin i, poc: <0.05 ng/mL (ref 0.00–0.09)

## 2011-03-18 LAB — PROTIME-INR
INR: 2.19 — ABNORMAL HIGH (ref 0.00–1.49)
Prothrombin Time: 24.2 seconds — ABNORMAL HIGH (ref 11.6–15.2)

## 2011-03-22 ENCOUNTER — Encounter: Payer: Self-pay | Admitting: Cardiology

## 2011-03-22 DIAGNOSIS — I679 Cerebrovascular disease, unspecified: Secondary | ICD-10-CM

## 2011-03-22 DIAGNOSIS — Z7901 Long term (current) use of anticoagulants: Secondary | ICD-10-CM | POA: Insufficient documentation

## 2011-03-22 DIAGNOSIS — R894 Abnormal immunological findings in specimens from other organs, systems and tissues: Secondary | ICD-10-CM

## 2011-03-25 ENCOUNTER — Ambulatory Visit (INDEPENDENT_AMBULATORY_CARE_PROVIDER_SITE_OTHER): Payer: PRIVATE HEALTH INSURANCE | Admitting: *Deleted

## 2011-03-25 DIAGNOSIS — Z7901 Long term (current) use of anticoagulants: Secondary | ICD-10-CM

## 2011-03-25 DIAGNOSIS — R894 Abnormal immunological findings in specimens from other organs, systems and tissues: Secondary | ICD-10-CM

## 2011-03-25 DIAGNOSIS — R791 Abnormal coagulation profile: Secondary | ICD-10-CM

## 2011-03-25 DIAGNOSIS — I679 Cerebrovascular disease, unspecified: Secondary | ICD-10-CM

## 2011-03-25 LAB — POCT INR: INR: 2.4

## 2011-04-01 LAB — CBC
HCT: 28.2 % — ABNORMAL LOW (ref 36.0–46.0)
HCT: 29.2 % — ABNORMAL LOW (ref 36.0–46.0)
HCT: 29.2 % — ABNORMAL LOW (ref 36.0–46.0)
HCT: 30.4 % — ABNORMAL LOW (ref 36.0–46.0)
HCT: 31 % — ABNORMAL LOW (ref 36.0–46.0)
HCT: 31 % — ABNORMAL LOW (ref 36.0–46.0)
HCT: 31.9 % — ABNORMAL LOW (ref 36.0–46.0)
HCT: 39.1 % (ref 36.0–46.0)
HCT: 35.2 % — ABNORMAL LOW (ref 36.0–46.0)
Hemoglobin: 10 g/dL — ABNORMAL LOW (ref 12.0–15.0)
Hemoglobin: 10.1 g/dL — ABNORMAL LOW (ref 12.0–15.0)
Hemoglobin: 10.4 g/dL — ABNORMAL LOW (ref 12.0–15.0)
Hemoglobin: 10.7 g/dL — ABNORMAL LOW (ref 12.0–15.0)
Hemoglobin: 10.7 g/dL — ABNORMAL LOW (ref 12.0–15.0)
Hemoglobin: 13.3 g/dL (ref 12.0–15.0)
Hemoglobin: 9.5 g/dL — ABNORMAL LOW (ref 12.0–15.0)
Hemoglobin: 9.8 g/dL — ABNORMAL LOW (ref 12.0–15.0)
Hemoglobin: 11.6 g/dL — ABNORMAL LOW (ref 12.0–15.0)
MCHC: 33.3 g/dL (ref 30.0–36.0)
MCHC: 33.5 g/dL (ref 30.0–36.0)
MCHC: 33.6 g/dL (ref 30.0–36.0)
MCHC: 33.6 g/dL (ref 30.0–36.0)
MCHC: 33.6 g/dL (ref 30.0–36.0)
MCHC: 34.1 g/dL (ref 30.0–36.0)
MCHC: 34.1 g/dL (ref 30.0–36.0)
MCHC: 34.6 g/dL (ref 30.0–36.0)
MCHC: 33 g/dL (ref 30.0–36.0)
MCV: 87.7 fL (ref 78.0–100.0)
MCV: 88.2 fL (ref 78.0–100.0)
MCV: 88.5 fL (ref 78.0–100.0)
MCV: 88.7 fL (ref 78.0–100.0)
MCV: 88.7 fL (ref 78.0–100.0)
MCV: 89.5 fL (ref 78.0–100.0)
MCV: 89.8 fL (ref 78.0–100.0)
MCV: 90.6 fL (ref 78.0–100.0)
MCV: 87.5 fL (ref 78.0–100.0)
Platelets: 230 10*3/uL (ref 150–400)
Platelets: 252 10*3/uL (ref 150–400)
Platelets: 268 10*3/uL (ref 150–400)
Platelets: 275 10*3/uL (ref 150–400)
Platelets: 297 10*3/uL (ref 150–400)
Platelets: 338 10*3/uL (ref 150–400)
Platelets: 362 10*3/uL (ref 150–400)
Platelets: 373 10*3/uL (ref 150–400)
Platelets: 388 10*3/uL (ref 150–400)
RBC: 3.22 MIL/uL — ABNORMAL LOW (ref 3.87–5.11)
RBC: 3.22 MIL/uL — ABNORMAL LOW (ref 3.87–5.11)
RBC: 3.29 MIL/uL — ABNORMAL LOW (ref 3.87–5.11)
RBC: 3.38 MIL/uL — ABNORMAL LOW (ref 3.87–5.11)
RBC: 3.47 MIL/uL — ABNORMAL LOW (ref 3.87–5.11)
RBC: 3.51 MIL/uL — ABNORMAL LOW (ref 3.87–5.11)
RBC: 3.61 MIL/uL — ABNORMAL LOW (ref 3.87–5.11)
RBC: 4.41 MIL/uL (ref 3.87–5.11)
RBC: 4.02 MIL/uL (ref 3.87–5.11)
RDW: 13.1 % (ref 11.5–15.5)
RDW: 13.2 % (ref 11.5–15.5)
RDW: 13.3 % (ref 11.5–15.5)
RDW: 13.3 % (ref 11.5–15.5)
RDW: 13.4 % (ref 11.5–15.5)
RDW: 13.5 % (ref 11.5–15.5)
RDW: 13.6 % (ref 11.5–15.5)
RDW: 13.7 % (ref 11.5–15.5)
RDW: 14.2 % (ref 11.5–15.5)
WBC: 10.3 10*3/uL (ref 4.0–10.5)
WBC: 12.4 10*3/uL — ABNORMAL HIGH (ref 4.0–10.5)
WBC: 13.4 10*3/uL — ABNORMAL HIGH (ref 4.0–10.5)
WBC: 15.7 10*3/uL — ABNORMAL HIGH (ref 4.0–10.5)
WBC: 7.9 10*3/uL (ref 4.0–10.5)
WBC: 8.2 10*3/uL (ref 4.0–10.5)
WBC: 8.4 10*3/uL (ref 4.0–10.5)
WBC: 9.1 10*3/uL (ref 4.0–10.5)
WBC: 8.7 10*3/uL (ref 4.0–10.5)

## 2011-04-01 LAB — BASIC METABOLIC PANEL
BUN: 10 mg/dL (ref 6–23)
BUN: 7 mg/dL (ref 6–23)
BUN: 16 mg/dL (ref 6–23)
CO2: 27 mEq/L (ref 19–32)
CO2: 27 mEq/L (ref 19–32)
CO2: 26 mEq/L (ref 19–32)
Calcium: 7.7 mg/dL — ABNORMAL LOW (ref 8.4–10.5)
Calcium: 7.9 mg/dL — ABNORMAL LOW (ref 8.4–10.5)
Calcium: 9.3 mg/dL (ref 8.4–10.5)
Chloride: 102 mEq/L (ref 96–112)
Chloride: 96 mEq/L (ref 96–112)
Chloride: 96 mEq/L (ref 96–112)
Creatinine, Ser: 0.49 mg/dL (ref 0.4–1.2)
Creatinine, Ser: 0.51 mg/dL (ref 0.4–1.2)
Creatinine, Ser: 0.72 mg/dL (ref 0.4–1.2)
GFR calc Af Amer: >60 mL/min (ref >60)
GFR calc Af Amer: >60 mL/min (ref >60)
GFR calc Af Amer: >60 mL/min (ref >60)
GFR calc non Af Amer: >60 mL/min (ref >60)
GFR calc non Af Amer: >60 mL/min (ref >60)
GFR calc non Af Amer: >60 mL/min (ref >60)
Glucose, Bld: 107 mg/dL — ABNORMAL HIGH (ref 70–99)
Glucose, Bld: 126 mg/dL — ABNORMAL HIGH (ref 70–99)
Glucose, Bld: 100 mg/dL — ABNORMAL HIGH (ref 70–99)
Potassium: 3.7 mEq/L (ref 3.5–5.1)
Potassium: 3.8 mEq/L (ref 3.5–5.1)
Potassium: 4.3 mEq/L (ref 3.5–5.1)
Sodium: 130 mEq/L — ABNORMAL LOW (ref 135–145)
Sodium: 135 mEq/L (ref 135–145)
Sodium: 132 mEq/L — ABNORMAL LOW (ref 135–145)

## 2011-04-01 LAB — PROTIME-INR
INR: 1.27 (ref 0.00–1.49)
INR: 1.31 (ref 0.00–1.49)
INR: 1.43 (ref 0.00–1.49)
INR: 1.66 — ABNORMAL HIGH (ref 0.00–1.49)
INR: 1.75 — ABNORMAL HIGH (ref 0.00–1.49)
INR: 1.81 — ABNORMAL HIGH (ref 0.00–1.49)
INR: 1.71 — ABNORMAL HIGH (ref 0.00–1.49)
Prothrombin Time: 15.8 seconds — ABNORMAL HIGH (ref 11.6–15.2)
Prothrombin Time: 16.2 seconds — ABNORMAL HIGH (ref 11.6–15.2)
Prothrombin Time: 17.3 seconds — ABNORMAL HIGH (ref 11.6–15.2)
Prothrombin Time: 19.5 seconds — ABNORMAL HIGH (ref 11.6–15.2)
Prothrombin Time: 20.3 seconds — ABNORMAL HIGH (ref 11.6–15.2)
Prothrombin Time: 20.8 seconds — ABNORMAL HIGH (ref 11.6–15.2)
Prothrombin Time: 19.9 seconds — ABNORMAL HIGH (ref 11.6–15.2)

## 2011-04-01 LAB — POCT CARDIAC MARKERS
CKMB, poc: 1.1 ng/mL (ref 1.0–8.0)
CKMB, poc: <1 ng/mL — ABNORMAL LOW (ref 1.0–8.0)
Myoglobin, poc: 32.1 ng/mL (ref 12–200)
Myoglobin, poc: 37.5 ng/mL (ref 12–200)
Troponin i, poc: 0.07 ng/mL (ref 0.00–0.09)
Troponin i, poc: 0.14 ng/mL — ABNORMAL HIGH (ref 0.00–0.09)

## 2011-04-01 LAB — IRON AND TIBC
Iron: 18 ug/dL — ABNORMAL LOW (ref 42–135)
Saturation Ratios: 8 % — ABNORMAL LOW (ref 20–55)
TIBC: 239 ug/dL — ABNORMAL LOW (ref 250–470)
UIBC: 221 ug/dL

## 2011-04-01 LAB — DIFFERENTIAL
Basophils Absolute: 0 10*3/uL (ref 0.0–0.1)
Basophils Absolute: 0 10*3/uL (ref 0.0–0.1)
Basophils Absolute: 0.1 10*3/uL (ref 0.0–0.1)
Basophils Absolute: 0.1 10*3/uL (ref 0.0–0.1)
Basophils Absolute: 0.2 10*3/uL — ABNORMAL HIGH (ref 0.0–0.1)
Basophils Relative: 0 % (ref 0–1)
Basophils Relative: 0 % (ref 0–1)
Basophils Relative: 1 % (ref 0–1)
Basophils Relative: 1 % (ref 0–1)
Basophils Relative: 2 % — ABNORMAL HIGH (ref 0–1)
Eosinophils Absolute: 0 10*3/uL (ref 0.0–0.7)
Eosinophils Absolute: 0 10*3/uL (ref 0.0–0.7)
Eosinophils Absolute: 0.1 10*3/uL (ref 0.0–0.7)
Eosinophils Absolute: 0.2 10*3/uL (ref 0.0–0.7)
Eosinophils Absolute: 0.2 10*3/uL (ref 0.0–0.7)
Eosinophils Relative: 0 % (ref 0–5)
Eosinophils Relative: 0 % (ref 0–5)
Eosinophils Relative: 1 % (ref 0–5)
Eosinophils Relative: 3 % (ref 0–5)
Eosinophils Relative: 2 % (ref 0–5)
Lymphocytes Relative: 18 % (ref 12–46)
Lymphocytes Relative: 18 % (ref 12–46)
Lymphocytes Relative: 7 % — ABNORMAL LOW (ref 12–46)
Lymphocytes Relative: 9 % — ABNORMAL LOW (ref 12–46)
Lymphocytes Relative: 21 % (ref 12–46)
Lymphs Abs: 1.1 10*3/uL (ref 0.7–4.0)
Lymphs Abs: 1.1 10*3/uL (ref 0.7–4.0)
Lymphs Abs: 1.4 10*3/uL (ref 0.7–4.0)
Lymphs Abs: 1.9 10*3/uL (ref 0.7–4.0)
Lymphs Abs: 1.9 10*3/uL (ref 0.7–4.0)
Monocytes Absolute: 0.8 10*3/uL (ref 0.1–1.0)
Monocytes Absolute: 1.2 10*3/uL — ABNORMAL HIGH (ref 0.1–1.0)
Monocytes Absolute: 1.4 10*3/uL — ABNORMAL HIGH (ref 0.1–1.0)
Monocytes Absolute: 1.4 10*3/uL — ABNORMAL HIGH (ref 0.1–1.0)
Monocytes Absolute: 0.8 10*3/uL (ref 0.1–1.0)
Monocytes Relative: 10 % (ref 3–12)
Monocytes Relative: 11 % (ref 3–12)
Monocytes Relative: 12 % (ref 3–12)
Monocytes Relative: 9 % (ref 3–12)
Monocytes Relative: 9 % (ref 3–12)
Neutro Abs: 13.2 10*3/uL — ABNORMAL HIGH (ref 1.7–7.7)
Neutro Abs: 5.3 10*3/uL (ref 1.7–7.7)
Neutro Abs: 7 10*3/uL (ref 1.7–7.7)
Neutro Abs: 9.9 10*3/uL — ABNORMAL HIGH (ref 1.7–7.7)
Neutro Abs: 5.7 10*3/uL (ref 1.7–7.7)
Neutrophils Relative %: 68 % (ref 43–77)
Neutrophils Relative %: 68 % (ref 43–77)
Neutrophils Relative %: 79 % — ABNORMAL HIGH (ref 43–77)
Neutrophils Relative %: 84 % — ABNORMAL HIGH (ref 43–77)
Neutrophils Relative %: 66 % (ref 43–77)

## 2011-04-01 LAB — BLOOD GAS, ARTERIAL
Acid-Base Excess: 3.4 mmol/L — ABNORMAL HIGH (ref 0.0–2.0)
Bicarbonate: 27.1 mEq/L — ABNORMAL HIGH (ref 20.0–24.0)
Drawn by: 317771
O2 Content: 4 L/min
O2 Saturation: 93.2 %
Patient temperature: 98.6
TCO2: 28.3 mmol/L (ref 0–100)
pCO2 arterial: 39.3 mmHg (ref 35.0–45.0)
pH, Arterial: 7.454 — ABNORMAL HIGH (ref 7.350–7.400)
pO2, Arterial: 66 mmHg — ABNORMAL LOW (ref 80.0–100.0)

## 2011-04-01 LAB — COMPREHENSIVE METABOLIC PANEL
ALT: 56 U/L — ABNORMAL HIGH (ref 0–35)
ALT: 78 U/L — ABNORMAL HIGH (ref 0–35)
AST: 46 U/L — ABNORMAL HIGH (ref 0–37)
AST: 77 U/L — ABNORMAL HIGH (ref 0–37)
Albumin: 2 g/dL — ABNORMAL LOW (ref 3.5–5.2)
Albumin: 2.4 g/dL — ABNORMAL LOW (ref 3.5–5.2)
Alkaline Phosphatase: 112 U/L (ref 39–117)
Alkaline Phosphatase: 78 U/L (ref 39–117)
BUN: 10 mg/dL (ref 6–23)
BUN: 5 mg/dL — ABNORMAL LOW (ref 6–23)
CO2: 27 mEq/L (ref 19–32)
CO2: 28 mEq/L (ref 19–32)
Calcium: 7.5 mg/dL — ABNORMAL LOW (ref 8.4–10.5)
Calcium: 8 mg/dL — ABNORMAL LOW (ref 8.4–10.5)
Chloride: 96 mEq/L (ref 96–112)
Chloride: 96 mEq/L (ref 96–112)
Creatinine, Ser: 0.48 mg/dL (ref 0.4–1.2)
Creatinine, Ser: 0.53 mg/dL (ref 0.4–1.2)
GFR calc Af Amer: >60 mL/min (ref >60)
GFR calc Af Amer: >60 mL/min (ref >60)
GFR calc non Af Amer: >60 mL/min (ref >60)
GFR calc non Af Amer: >60 mL/min (ref >60)
Glucose, Bld: 101 mg/dL — ABNORMAL HIGH (ref 70–99)
Glucose, Bld: 99 mg/dL (ref 70–99)
Potassium: 3.2 mEq/L — ABNORMAL LOW (ref 3.5–5.1)
Potassium: 3.9 mEq/L (ref 3.5–5.1)
Sodium: 129 mEq/L — ABNORMAL LOW (ref 135–145)
Sodium: 129 mEq/L — ABNORMAL LOW (ref 135–145)
Total Bilirubin: 0.7 mg/dL (ref 0.3–1.2)
Total Bilirubin: 1.3 mg/dL — ABNORMAL HIGH (ref 0.3–1.2)
Total Protein: 5.4 g/dL — ABNORMAL LOW (ref 6.0–8.3)
Total Protein: 5.6 g/dL — ABNORMAL LOW (ref 6.0–8.3)

## 2011-04-01 LAB — URINE CULTURE
Colony Count: NO GROWTH
Culture: NO GROWTH

## 2011-04-01 LAB — PREPARE FRESH FROZEN PLASMA

## 2011-04-01 LAB — CULTURE, BLOOD (ROUTINE X 2)
Culture: NO GROWTH
Culture: NO GROWTH

## 2011-04-01 LAB — APTT
aPTT: 38 seconds — ABNORMAL HIGH (ref 24–37)
aPTT: 64 seconds — ABNORMAL HIGH (ref 24–37)
aPTT: 66 seconds — ABNORMAL HIGH (ref 24–37)
aPTT: 67 seconds — ABNORMAL HIGH (ref 24–37)
aPTT: 69 seconds — ABNORMAL HIGH (ref 24–37)
aPTT: 71 seconds — ABNORMAL HIGH (ref 24–37)
aPTT: 72 seconds — ABNORMAL HIGH (ref 24–37)
aPTT: 85 seconds — ABNORMAL HIGH (ref 24–37)
aPTT: 86 seconds — ABNORMAL HIGH (ref 24–37)
aPTT: 49 seconds — ABNORMAL HIGH (ref 24–37)

## 2011-04-01 LAB — FERRITIN: Ferritin: 145 ng/mL (ref 10–291)

## 2011-04-01 LAB — DIGOXIN LEVEL: Digoxin Level: 0.3 ng/mL — ABNORMAL LOW (ref 0.8–2.0)

## 2011-04-01 LAB — MRSA PCR SCREENING

## 2011-04-01 LAB — VANCOMYCIN, TROUGH: Vancomycin Tr: 14.2 ug/mL (ref 10.0–20.0)

## 2011-04-01 LAB — CK TOTAL AND CKMB (NOT AT ARMC)
CK, MB: 0.8 ng/mL (ref 0.3–4.0)
Relative Index: INVALID (ref 0.0–2.5)
Total CK: 39 U/L (ref 7–177)

## 2011-04-01 LAB — D-DIMER, QUANTITATIVE (NOT AT ARMC): D-Dimer, Quant: 3.2 ug/mL-FEU — ABNORMAL HIGH (ref 0.00–0.48)

## 2011-04-01 LAB — TROPONIN I: Troponin I: 0.02 ng/mL (ref 0.00–0.06)

## 2011-04-02 LAB — BASIC METABOLIC PANEL
BUN: 5 mg/dL — ABNORMAL LOW (ref 6–23)
BUN: 6 mg/dL (ref 6–23)
CO2: 29 mEq/L (ref 19–32)
CO2: 31 mEq/L (ref 19–32)
Calcium: 8.3 mg/dL — ABNORMAL LOW (ref 8.4–10.5)
Calcium: 8.4 mg/dL (ref 8.4–10.5)
Chloride: 100 mEq/L (ref 96–112)
Chloride: 101 mEq/L (ref 96–112)
Creatinine, Ser: 0.49 mg/dL (ref 0.4–1.2)
Creatinine, Ser: 0.61 mg/dL (ref 0.4–1.2)
GFR calc Af Amer: >60 mL/min (ref >60)
GFR calc Af Amer: >60 mL/min (ref >60)
GFR calc non Af Amer: >60 mL/min (ref >60)
GFR calc non Af Amer: >60 mL/min (ref >60)
Glucose, Bld: 128 mg/dL — ABNORMAL HIGH (ref 70–99)
Glucose, Bld: 96 mg/dL (ref 70–99)
Potassium: 3.7 mEq/L (ref 3.5–5.1)
Potassium: 4.3 mEq/L (ref 3.5–5.1)
Sodium: 137 mEq/L (ref 135–145)
Sodium: 137 mEq/L (ref 135–145)

## 2011-04-02 LAB — DIC (DISSEMINATED INTRAVASCULAR COAGULATION) PANEL (NOT AT ARMC)
Fibrinogen: 422 mg/dL (ref 204–475)
Smear Review: NONE SEEN
aPTT: 47 seconds — ABNORMAL HIGH (ref 24–37)
aPTT: 48 seconds — ABNORMAL HIGH (ref 24–37)

## 2011-04-02 LAB — URINALYSIS, ROUTINE W REFLEX MICROSCOPIC
Bilirubin Urine: NEGATIVE
Glucose, UA: NEGATIVE mg/dL
Ketones, ur: NEGATIVE mg/dL
Leukocytes, UA: NEGATIVE
Nitrite: NEGATIVE
Protein, ur: NEGATIVE mg/dL
Specific Gravity, Urine: 1.011 (ref 1.005–1.030)
Urobilinogen, UA: 0.2 mg/dL (ref 0.0–1.0)
pH: 5.5 (ref 5.0–8.0)

## 2011-04-02 LAB — CBC
HCT: 41.7 % (ref 36.0–46.0)
HCT: 37.7 % (ref 36.0–46.0)
HCT: 39 % (ref 36.0–46.0)
HCT: 39.5 % (ref 36.0–46.0)
HCT: 42.6 % (ref 36.0–46.0)
HCT: 43.1 % (ref 36.0–46.0)
Hemoglobin: 14.1 g/dL (ref 12.0–15.0)
Hemoglobin: 12.8 g/dL (ref 12.0–15.0)
Hemoglobin: 13.3 g/dL (ref 12.0–15.0)
Hemoglobin: 13.4 g/dL (ref 12.0–15.0)
Hemoglobin: 14.3 g/dL (ref 12.0–15.0)
Hemoglobin: 14.5 g/dL (ref 12.0–15.0)
MCHC: 33.8 g/dL (ref 30.0–36.0)
MCHC: 33.5 g/dL (ref 30.0–36.0)
MCHC: 33.6 g/dL (ref 30.0–36.0)
MCHC: 34 g/dL (ref 30.0–36.0)
MCHC: 34 g/dL (ref 30.0–36.0)
MCHC: 34 g/dL (ref 30.0–36.0)
MCV: 87.8 fL (ref 78.0–100.0)
MCV: 88.5 fL (ref 78.0–100.0)
MCV: 88.8 fL (ref 78.0–100.0)
MCV: 88.9 fL (ref 78.0–100.0)
MCV: 89.3 fL (ref 78.0–100.0)
MCV: 89.6 fL (ref 78.0–100.0)
Platelets: 55 10*3/uL — ABNORMAL LOW (ref 150–400)
Platelets: 109 10*3/uL — ABNORMAL LOW (ref 150–400)
Platelets: 174 10*3/uL (ref 150–400)
Platelets: 58 10*3/uL — ABNORMAL LOW (ref 150–400)
Platelets: 65 10*3/uL — ABNORMAL LOW (ref 150–400)
Platelets: 67 10*3/uL — ABNORMAL LOW (ref 150–400)
RBC: 4.75 MIL/uL (ref 3.87–5.11)
RBC: 4.22 MIL/uL (ref 3.87–5.11)
RBC: 4.41 MIL/uL (ref 3.87–5.11)
RBC: 4.41 MIL/uL (ref 3.87–5.11)
RBC: 4.79 MIL/uL (ref 3.87–5.11)
RBC: 4.86 MIL/uL (ref 3.87–5.11)
RDW: 13.6 % (ref 11.5–15.5)
RDW: 13 % (ref 11.5–15.5)
RDW: 13.1 % (ref 11.5–15.5)
RDW: 13.4 % (ref 11.5–15.5)
RDW: 13.6 % (ref 11.5–15.5)
RDW: 13.7 % (ref 11.5–15.5)
WBC: 9.8 10*3/uL (ref 4.0–10.5)
WBC: 10.2 10*3/uL (ref 4.0–10.5)
WBC: 7 10*3/uL (ref 4.0–10.5)
WBC: 8.9 10*3/uL (ref 4.0–10.5)
WBC: 9.4 10*3/uL (ref 4.0–10.5)
WBC: 9.7 10*3/uL (ref 4.0–10.5)

## 2011-04-02 LAB — CROSSMATCH
ABO/RH(D): A POS
ABO/RH(D): A POS
Antibody Screen: NEGATIVE
Antibody Screen: NEGATIVE

## 2011-04-02 LAB — LACTATE DEHYDROGENASE, ISOENZYMES
LD1/LD2 Ratio: 0.6
LDH 1: 17 % — ABNORMAL LOW (ref 19–38)
LDH 2: 28 % — ABNORMAL LOW (ref 30–43)
LDH 3: 25 % (ref 16–26)
LDH 4: 12 % (ref 3–12)
LDH 5: 18 % — ABNORMAL HIGH (ref 3–14)
LDH Isoenzymes, Total: 205

## 2011-04-02 LAB — DIFFERENTIAL
Basophils Absolute: 0 10*3/uL (ref 0.0–0.1)
Basophils Absolute: 0 10*3/uL (ref 0.0–0.1)
Basophils Absolute: 0 10*3/uL (ref 0.0–0.1)
Basophils Absolute: 0 10*3/uL (ref 0.0–0.1)
Basophils Relative: 0 % (ref 0–1)
Basophils Relative: 0 % (ref 0–1)
Basophils Relative: 0 % (ref 0–1)
Basophils Relative: 0 % (ref 0–1)
Eosinophils Absolute: 0.2 10*3/uL (ref 0.0–0.7)
Eosinophils Absolute: 0.1 10*3/uL (ref 0.0–0.7)
Eosinophils Absolute: 0.1 10*3/uL (ref 0.0–0.7)
Eosinophils Absolute: 0.2 10*3/uL (ref 0.0–0.7)
Eosinophils Relative: 2 % (ref 0–5)
Eosinophils Relative: 1 % (ref 0–5)
Eosinophils Relative: 1 % (ref 0–5)
Eosinophils Relative: 2 % (ref 0–5)
Lymphocytes Relative: 17 % (ref 12–46)
Lymphocytes Relative: 11 % — ABNORMAL LOW (ref 12–46)
Lymphocytes Relative: 14 % (ref 12–46)
Lymphocytes Relative: 15 % (ref 12–46)
Lymphs Abs: 1.7 10*3/uL (ref 0.7–4.0)
Lymphs Abs: 1.1 10*3/uL (ref 0.7–4.0)
Lymphs Abs: 1.2 10*3/uL (ref 0.7–4.0)
Lymphs Abs: 1.4 10*3/uL (ref 0.7–4.0)
Monocytes Absolute: 0.5 10*3/uL (ref 0.1–1.0)
Monocytes Absolute: 0.7 10*3/uL (ref 0.1–1.0)
Monocytes Absolute: 0.7 10*3/uL (ref 0.1–1.0)
Monocytes Absolute: 0.8 10*3/uL (ref 0.1–1.0)
Monocytes Relative: 5 % (ref 3–12)
Monocytes Relative: 8 % (ref 3–12)
Monocytes Relative: 8 % (ref 3–12)
Monocytes Relative: 9 % (ref 3–12)
Neutro Abs: 7.4 10*3/uL (ref 1.7–7.7)
Neutro Abs: 6.8 10*3/uL (ref 1.7–7.7)
Neutro Abs: 7.1 10*3/uL (ref 1.7–7.7)
Neutro Abs: 7.6 10*3/uL (ref 1.7–7.7)
Neutrophils Relative %: 76 % (ref 43–77)
Neutrophils Relative %: 76 % (ref 43–77)
Neutrophils Relative %: 76 % (ref 43–77)
Neutrophils Relative %: 79 % — ABNORMAL HIGH (ref 43–77)

## 2011-04-02 LAB — COMPREHENSIVE METABOLIC PANEL
ALT: 23 U/L (ref 0–35)
AST: 28 U/L (ref 0–37)
Albumin: 3.4 g/dL — ABNORMAL LOW (ref 3.5–5.2)
Alkaline Phosphatase: 61 U/L (ref 39–117)
BUN: 10 mg/dL (ref 6–23)
CO2: 29 mEq/L (ref 19–32)
Calcium: 8.7 mg/dL (ref 8.4–10.5)
Chloride: 98 mEq/L (ref 96–112)
Creatinine, Ser: 0.7 mg/dL (ref 0.4–1.2)
GFR calc Af Amer: >60 mL/min (ref >60)
GFR calc non Af Amer: >60 mL/min (ref >60)
Glucose, Bld: 89 mg/dL (ref 70–99)
Potassium: 3.9 mEq/L (ref 3.5–5.1)
Sodium: 135 mEq/L (ref 135–145)
Total Bilirubin: 0.5 mg/dL (ref 0.3–1.2)
Total Protein: 6.9 g/dL (ref 6.0–8.3)

## 2011-04-02 LAB — DIC (DISSEMINATED INTRAVASCULAR COAGULATION)PANEL
D-Dimer, Quant: 1 ug/mL-FEU — ABNORMAL HIGH (ref 0.00–0.48)
D-Dimer, Quant: 6.43 ug/mL-FEU — ABNORMAL HIGH (ref 0.00–0.48)
Fibrinogen: 560 mg/dL — ABNORMAL HIGH (ref 204–475)
INR: 1.91 — ABNORMAL HIGH (ref 0.00–1.49)
INR: 2.15 — ABNORMAL HIGH (ref 0.00–1.49)
Platelets: 105 10*3/uL — ABNORMAL LOW (ref 150–400)
Platelets: 54 10*3/uL — ABNORMAL LOW (ref 150–400)
Prothrombin Time: 21.7 seconds — ABNORMAL HIGH (ref 11.6–15.2)
Prothrombin Time: 23.8 seconds — ABNORMAL HIGH (ref 11.6–15.2)
Smear Review: NONE SEEN

## 2011-04-02 LAB — FACTOR 10 ASSAY
Factor X Activity: 17 % — ABNORMAL LOW (ref 72–134)
Factor X Activity: 18 % — ABNORMAL LOW (ref 72–134)

## 2011-04-02 LAB — PREPARE FRESH FROZEN PLASMA

## 2011-04-02 LAB — PROTIME-INR
INR: 4.39 — ABNORMAL HIGH (ref 0.00–1.49)
INR: 1.89 — ABNORMAL HIGH (ref 0.00–1.49)
INR: 2.12 — ABNORMAL HIGH (ref 0.00–1.49)
INR: 3.62 — ABNORMAL HIGH (ref 0.00–1.49)
Prothrombin Time: 41.6 seconds — ABNORMAL HIGH (ref 11.6–15.2)
Prothrombin Time: 21.5 seconds — ABNORMAL HIGH (ref 11.6–15.2)
Prothrombin Time: 23.6 seconds — ABNORMAL HIGH (ref 11.6–15.2)
Prothrombin Time: 35.8 seconds — ABNORMAL HIGH (ref 11.6–15.2)

## 2011-04-02 LAB — URINE MICROSCOPIC-ADD ON

## 2011-04-02 LAB — HEPARIN ANTI-XA: Heparin LMW: 0.2 IU/mL

## 2011-04-02 LAB — ANA: Anti Nuclear Antibody(ANA): NEGATIVE

## 2011-04-02 LAB — URINE CULTURE
Colony Count: NO GROWTH
Culture: NO GROWTH

## 2011-04-02 LAB — PLATELET ANTIBODIES, DIRECT: Platelet IgG Ab, Direct: NEGATIVE

## 2011-04-02 LAB — AMYLASE: Amylase: 37 U/L (ref 0–105)

## 2011-04-02 LAB — ABO/RH: ABO/RH(D): A POS

## 2011-04-02 LAB — LIPASE, BLOOD: Lipase: 24 U/L (ref 11–59)

## 2011-04-02 LAB — APTT: aPTT: 57 seconds — ABNORMAL HIGH (ref 24–37)

## 2011-04-02 LAB — MRSA PCR SCREENING: MRSA by PCR: POSITIVE — AB

## 2011-04-08 ENCOUNTER — Other Ambulatory Visit: Payer: Self-pay | Admitting: *Deleted

## 2011-04-08 IMAGING — CR DG LUMBAR SPINE COMPLETE 4+V
5 series · 5 of 5 positions shown · non-contrast
Comparison: Chest radiographs [DATE].

CLINICAL DATA: 48-year-old female status post fall from horse with
central low back pain.

LUMBAR SPINE - COMPLETE 4+ VIEW

[view not recorded (1 of 5)]
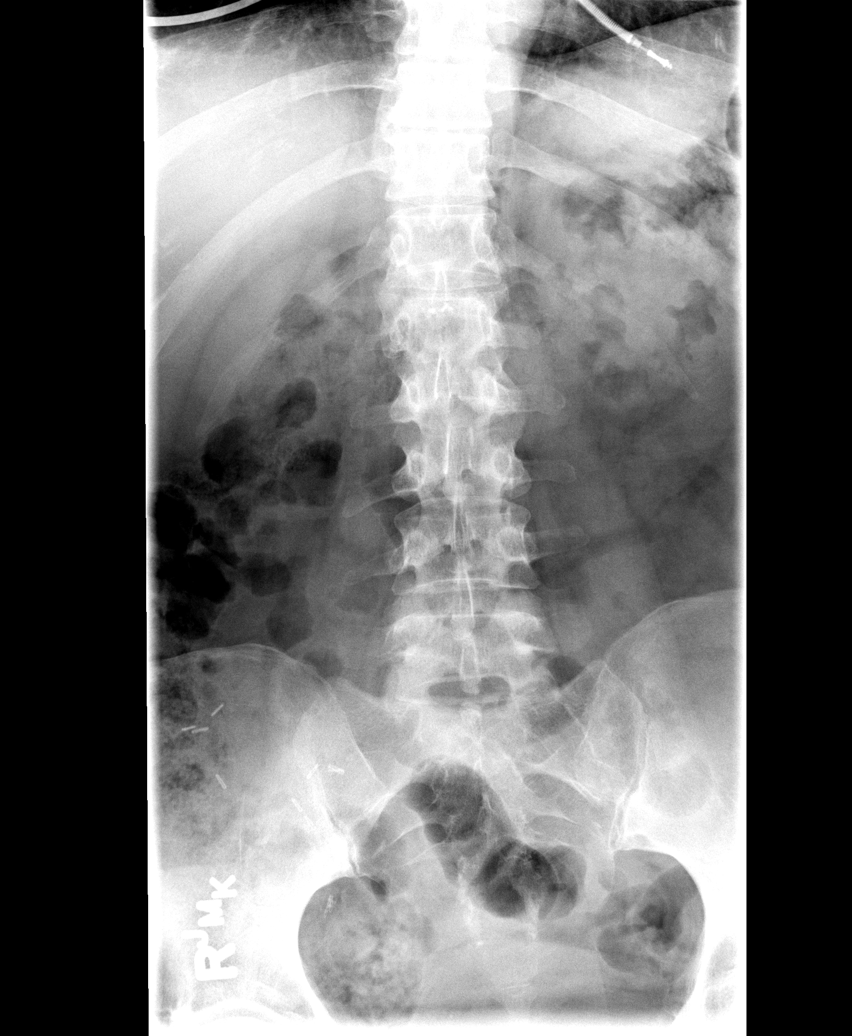

[view not recorded (2 of 5)]
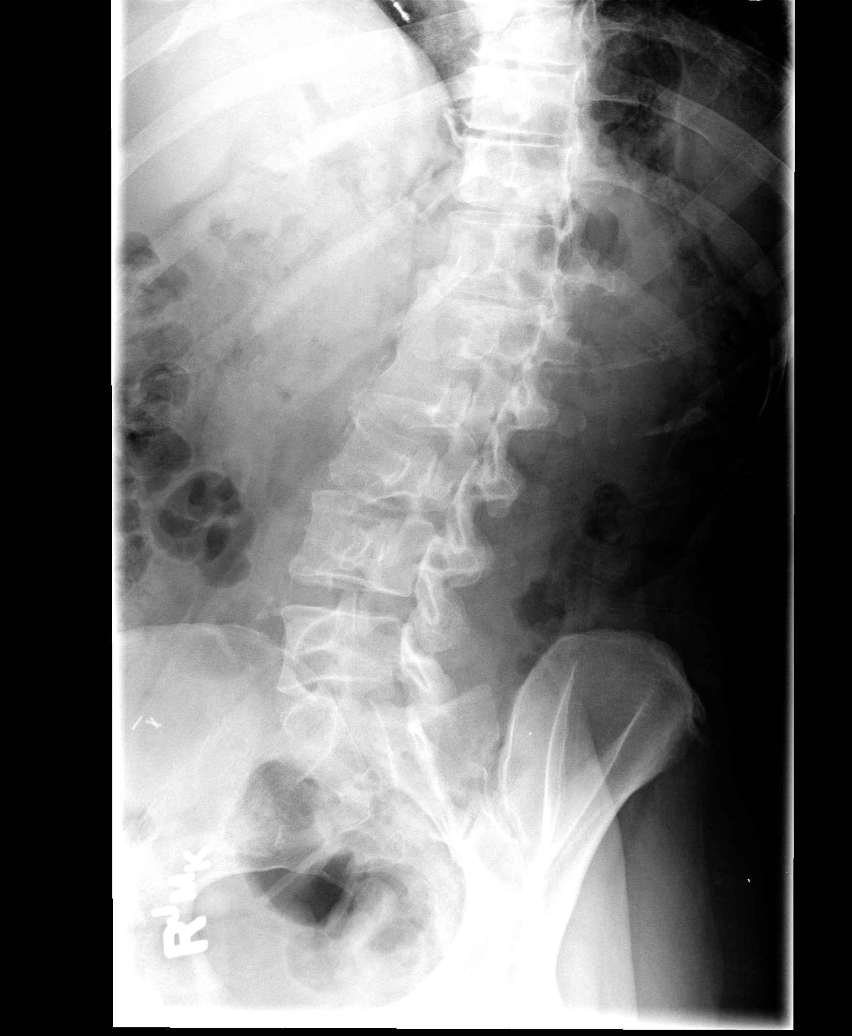

[view not recorded (3 of 5)]
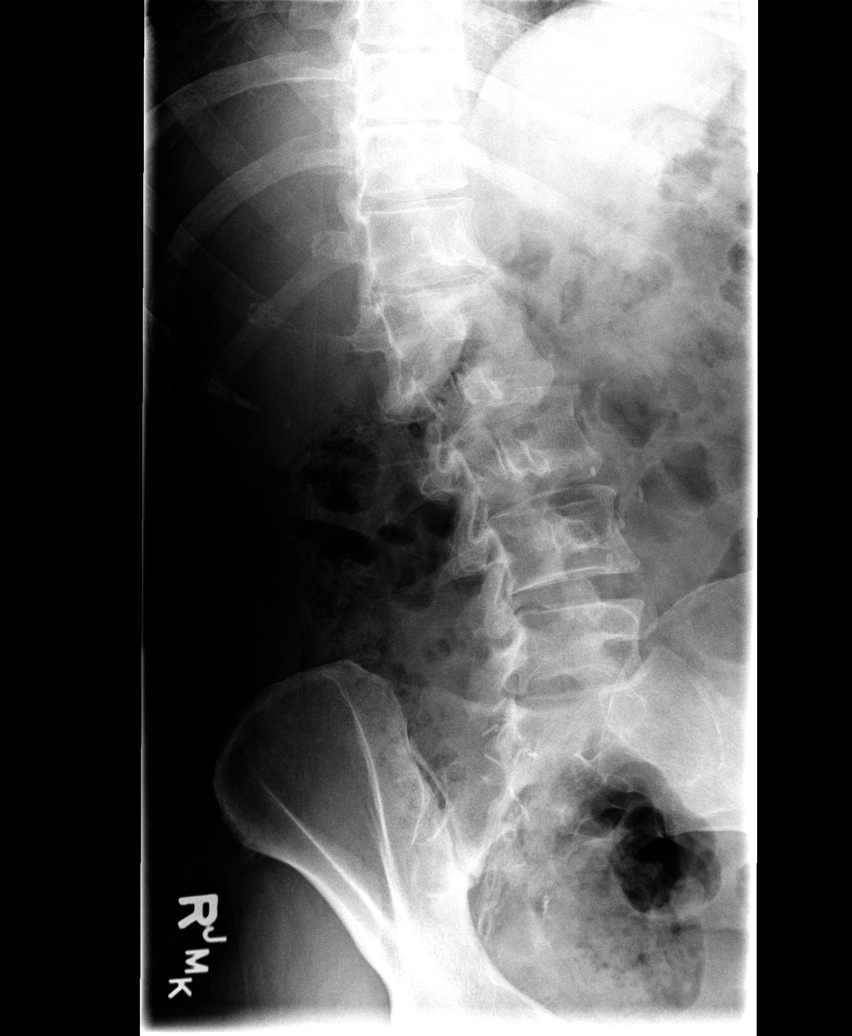

[view not recorded (4 of 5)]
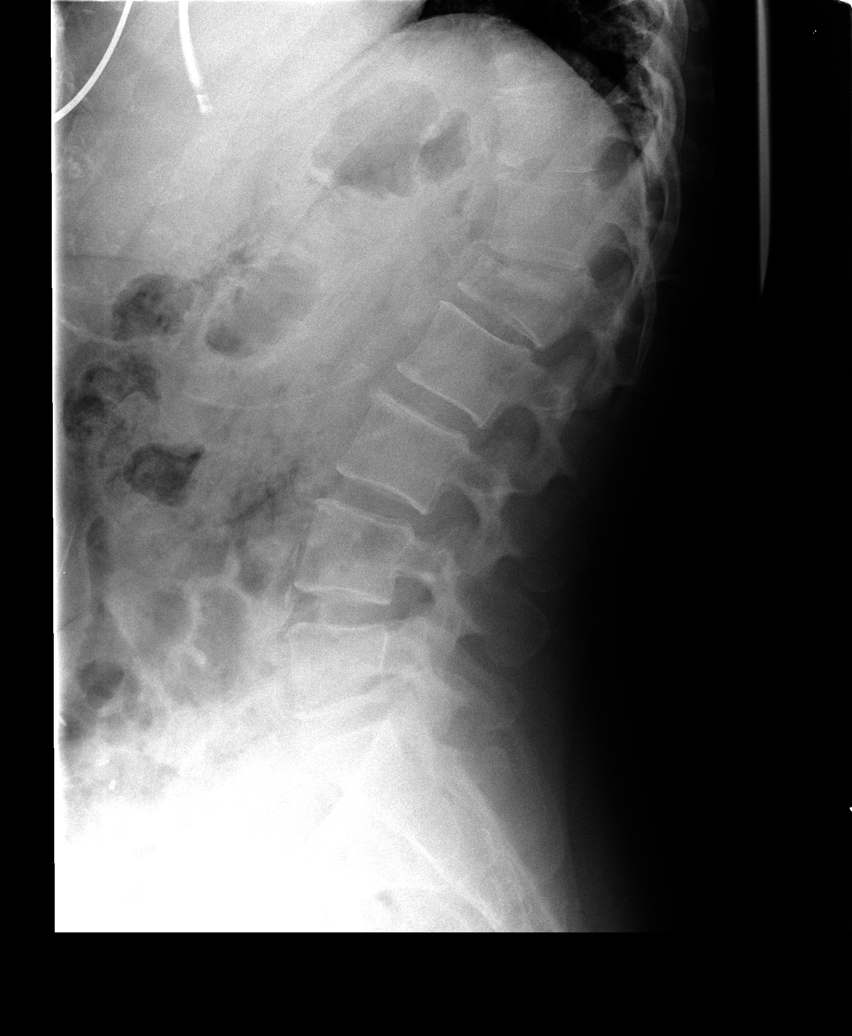

[view not recorded (5 of 5)]
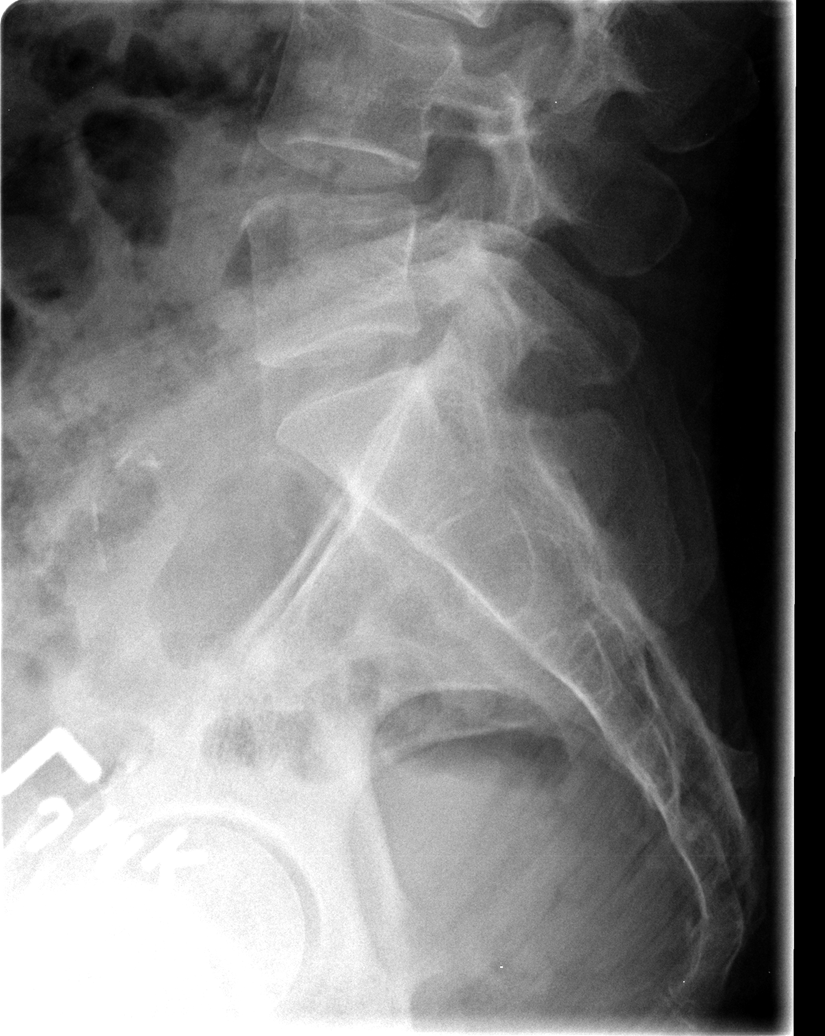

[5 of 5 positions shown; findings below may reference images not displayed]

FINDINGS: A portion of a cardiac pacemaker lead is evident at the
level of the diaphragm.  Normal lumbar segmentation.  There is a
moderate compression fracture of the L2 vertebral body with
subsequent focal kyphosis.  Other lumbar visualized lower thoracic
vertebrae appear intact.  This is new from the prior chest
radiograph.  No pars fracture.  No spondylolisthesis.  Surgical
clips project over the right hemi pelvis.  Visualized sacrum and
pelvis appear grossly intact.
IMPRESSION: 1.  Acute moderate L2 compression fracture.
2.  No other acute findings in the lumbar spine.

## 2011-04-08 MED ORDER — DIGOXIN 250 MCG PO TABS: 250.0000 ug | ORAL_TABLET | Freq: Every day | ORAL | Status: DC

## 2011-04-16 ENCOUNTER — Encounter: Payer: Self-pay | Admitting: Internal Medicine

## 2011-04-22 ENCOUNTER — Encounter: Payer: PRIVATE HEALTH INSURANCE | Admitting: *Deleted

## 2011-04-23 ENCOUNTER — Encounter: Payer: Self-pay | Admitting: Cardiology

## 2011-04-23 ENCOUNTER — Encounter: Payer: Self-pay | Admitting: *Deleted

## 2011-04-24 ENCOUNTER — Encounter: Payer: Self-pay | Admitting: Cardiology

## 2011-04-24 ENCOUNTER — Ambulatory Visit: Payer: PRIVATE HEALTH INSURANCE | Admitting: Cardiology

## 2011-05-14 NOTE — Assessment & Plan Note (Signed)
[facility]                       [place] CARDIOLOGY OFFICE NOTE   NAME:[name], [name]                       MRN:          [id number]  DATE:03/31/2008                            DOB:          [birth year]    HISTORY:  [name] [name] returns today for followup.  She has a history of  ischemic cardiomyopathy with CHF and EF in the 25% range, status post  AICD.  She has anticardiolipin syndrome on chronic Coumadin.  The  patient has had a previous CVA with an occluded right carotid artery.  Left carotid artery has not had significant disease.   She has had Raynaud's.  She has been very sedentary.  She needs to get  out and about.  She has gained some weight.  She is not doing drugs, but  does continue to smoke.   REVIEW OF SYSTEMS:  She has not had any significant chest pain.  She has  mild exertional dyspnea.  There has been no PND, orthopnea, no fluid  overload, no hospitalizations for her heart.  Review of systems  otherwise negative.   CURRENT MEDICATIONS:  1. Aldactone 25 a day.  2. KCl 20 a day.  3. Aspirin.  4. Digoxin 0.25 a day.  5. Pepcid 20 a day.  6. Imdur 15 b.i.d.  7. Warfarin as directed.  INR 4.1 today.  8. Furosemide 40 a day.  9. Famotidine 20 b.i.d.  10.Metoprolol 100 b.i.d.  11.Lisinopril 5 a day.   She saw Dr. Ladona Ridgel in December 2008 and her AICD was functioning  normally.   PHYSICAL EXAMINATION:  GENERAL:  Remarkable for a somewhat disheveled  middle-aged white female with poor hygiene.  VITAL SIGNS:  Weight is 231, blood pressure 116/70, pulse 79 and  regular, respiratory rate 14, afebrile.  HEENT:  Unremarkable.  NECK:  Carotids are normal without bruit.  No lymphadenopathy,  thyromegaly, JVP elevation.  I cannot hear audible bruits.  LUNGS:  Clear with good diaphragmatic motion.  No wheezing.  CARDIAC:  S1-S2 and heart sounds are distant.  PMI not palpable.  AICD  under the left clavicle.  ABDOMEN:  Protuberant.  Bowel  sounds are positive.  No AAA.  No  tenderness.  No hepatosplenomegaly or hepatojugular reflux.  EXTREMITIES:  Distal pulses are intact.  No edema.  NEURO:  Nonfocal.  SKIN:  Warm and dry.  No muscular weakness.   As indicated, I reviewed her INR records.  They have been somewhat all  over the place. March 08, 2008, she was 1.9 and now she has shot up to  4.1.   I reviewed her AICD interrogation which was normal.  I  reviewed carotid  duplex in January 2009.  No significant disease in the left side,  chronically occluded right carotid.   IMPRESSION:  Ischemic cardiomyopathy, currently not having chest pain.   DISCHARGE INSTRUCTIONS:  1. Possible MRI in a year to reassess LV function.  2. AICD follow up with Dr. Ladona Ridgel, functioning well.  No discharges.   IMPRESSION:  1. Congestive heart failure.  Continue current therapy including Lasix      40  a day.  Appears euvolemic.  2. Carotid disease, total right coronary artery.  Follow up left      carotid duplex in 2 years.  Continue aspirin therapy.  3. Deconditioning and weight gain.  The patient will try to go to      [employer].  We will get her enrolled in cardiac rehab in      [place] for CHF training.  4. Smoking.  The patient not interested in Chantix at this time.  She      needs to cut back.  She understands the long-term risk in terms of      coronary disease and lung disease. We will offer Chantix in the      future if she wants it.   FOLLOW UP:  1. I will see her back in a year.  2. Dr. Ladona Ridgel will see her in 6 months.     [name] [name]. [name] Emms, MD, [facility]  Electronically Signed    PCN/MedQ  DD: 03/31/2008  DT: 03/31/2008  Job #: [id number]

## 2011-05-14 NOTE — Assessment & Plan Note (Signed)
[facility]                       [place] CARDIOLOGY OFFICE NOTE   NAME:[name] [name], [name] [name]                       MRN:          [id number]  DATE:12/28/2007                            DOB:          [birth year]    [name] [name] is a 46-year patient seen as a new patient.  She had  previously been seen at [facility] in [place],  [place].  The patient had been down in [place] by herself.  She has a complicated past medical history.  I read through over 40  pages of records from Dr. Mila Homer and the cardiovascular group at  [facility]. The patient has a history of previous  stroke.  It is not clear to me the etiology.  There are two  possibilities.  She apparently has an occlusion of the right internal  carotid artery, but no critical disease in the left.  She was also  diagnosed 4 years ago at [facility] as having anti-cardiolipin syndrome and  has been on chronic Coumadin therapy.   The patient also has a history of cocaine use, the last 3-4 months ago,  as well as smoking.  She is down to a pack a week.   She has suffered a subendocardial MI in the middle of November. She was  cathed and found to have an occluded diagonal branch with diffuse LAD  disease and occluded circumflex and no critical disease in her right.  Her arteries were not thought suitable for angioplasty or bypass.  She  was treated medically and an AICD was implanted; it appears to be  functioning normally.  However, the patient may have a retained suture  in the lateral aspect of the wound in talking to the patient.  She has  not had any significant exertional dyspnea or chest pain. She felt bad  on pravastatin and was wondering what to do about her cholesterol.   She has not had any discharges, palpitations, PND, orthopnea. Since her  hospitalization, she has moved back with her mom and dad and brothers  and sisters in [place].   She  is following up in our clinic in [place] in regards to her  Coumadin and is being established with me as a cardiologist.   PAST MEDICAL HISTORY:  Is remarkable for:  1. Previous cocaine use.  2. Smoking.  3. Anticardiolipin syndrome.  4. Coronary disease with subendocardial myocardial infarction.  5. Two vessel disease, inoperative; AICD implantation November 2008.  6. History of a thoracentesis with pleural effusions.  7. History of appendectomy.  8. History of throat surgeries for polyps.  9. Previous CVA with 100% right carotid artery occlusion.   THE PATIENT IS ALLERGIC TO PENICILLIN.   FAMILY HISTORY:  Is remarkable for father being alive with previous  coronary artery bypass graft. Mother being alive.  Brother with an MI.  Positive family history of premature coronary disease as indicated.  The  patient is not working.  She is on disability. She has moved back home.  She is sedentary.  She denies current drug use, but continues to smoke.  She  needs to find a primary care MD she was asking for Ativan refills.   CURRENT MEDICATIONS:  1. Include Aldactone 25 a day.  2. KCl 20 a day.  3. Aspirin a day.  4. Zestril 5 a day.  5. Digoxin 0.25 a day.  6. Pepcid 20 a day.  7. Isordil 15 b.i.d.  8. Warfarin as directed, currently 7- 1/2 mg a day and 10 mg twice a      week.  INR today 2.4.  9. Lasix 40 a day.  10.Famotidine 20 b.i.d.  11.Metoprolol 100 b.i.d.   PHYSICAL EXAMINATION:  Is remarkable for a talkative and anxious middle  age white female in no distress. Weight is 227, blood pressure is  100/66, respiratory rate 12, pulse 78, afebrile.  HEENT:  Unremarkable.  Carotids normal without bruit.  No  lymphadenopathy, thyromegaly, JVP elevation.  LUNGS:  Clear, good  diaphragmatic motion.  No wheezing.  AICD site is fairly well healed.  There is a small scab on the lateral  aspect with no erythema. I cannot tell if there is a subcuticular suture  that is coming out of  the skin.  There is no fluctuance.  We will have  to have EP doctors look at this. There is S1-S2 with normal heart  sounds.  PMI is not palpable.  ABDOMEN:  Benign. Bowel sounds were positive.  No AAA.  No bruits. No  hepatosplenomegaly. No hepatojugular reflux.  Distal pulses intact.  No edema.  NEURO: Nonfocal.  No muscle weakness.  SKIN: Is warm and dry.   EKG shows sinus rhythm with nonspecific ST-T wave changes; previous  lateral wall MI.   As indicated, INR today was 2.4   IMPRESSION:  1. Ischemic cardiomyopathy. Currently euvolemic, continue current      medications.  2. Automatic implantable cardioverter defibrillator  implantation.      Lateral aspect of the wound may have a retained suture, to see EP      doctors next week to further evaluate this. No indication for      antibiotics at this time. Automatic implantable cardioverter      defibrillator functioning normally by check at the end of November      at [facility].  3. Anticardiolipin syndrome. On chronic Coumadin.  INR therapeutic      today.  Follow-up in 2 weeks.  4. History of total right carotid artery occlusion. Follow-up duplex      scan in the next week or two to assess left-sided circulation.  5. Hypertension, currently well-controlled.  Continue current      medications including ACE inhibitor.  6. History of nonsustained VT in the setting of ischemic      cardiomyopathy and an ejection fraction of 30%.  Automatic      implantable cardioverter defibrillator in place functioning      normally; continue fairly high dose beta-blocker.  7. Anxiety and depression.  Continue Ativan, 96-month supply given,      encouraged to find primary care doctor.  8. Previous history of cocaine use. It may be worthwhile to spot check      her urine for drug screen. Will talk to her about this in the      future.  9. Smoking.  Continue current efforts to a scale back. Currently down      to one pack per week per the  patient. Would not start Chantix at      this time, may benefit well from  Wellbutrin, will leave this up to      primary care MD.  10.History of reflux.  Continue famotidine 20 mg b.i.d. and avoid      spicy meals and late night caloric intake.   I will see the patient back in 6-8 weeks. At that time we may institute  simvastatin 20 mg a day and see if she tolerates this.  Apparently, the  pravastatin caused some muscle aches and pains particularly in the right  shoulder and jaws. Am not sure it is related to this, but she does not  want to resume this medication.     Noralyn Pick. Eden Emms, MD, [facility]  Electronically Signed    PCN/MedQ  DD: 12/28/2007  DT: 12/28/2007  Job #: [id number]

## 2011-05-14 NOTE — Assessment & Plan Note (Signed)
[facility]                       [place] CARDIOLOGY OFFICE NOTE   NAME:[name] [name], [name] [name]                       MRN:          [id number]  DATE:03/17/2009                            DOB:          01-Aug-1961    PRIMARY CARE PHYSICIAN:  Ramon Dredge L. [name], MD.   REASON FOR VISIT:  Scheduled followup.   HISTORY OF PRESENT ILLNESS:  [name] [name] was seen in the office back in  February by Mr. Alben Spittle.  Her blood pressure has improved with fewer  episodes of orthostatic dizziness following now discontinuation of  Aldactone.  Her follow up labs in February showed stable renal function  with BUN of 14 and creatinine of 0.59, and a normal potassium of 4.6.  She is not reporting any typical exertional angina, but does have chest  pain when she gets emotionally upset requiring nitroglycerin.  Otherwise, her medications have remained stable.  She continues on  Coumadin through our Coumadin Clinic, and did have carotid Dopplers done  earlier in January demonstrating a chronically occluded right internal  carotid artery with 0-39% stenosis of the left internal carotid artery.  She is tolerating low-dose omega-3 supplements at this time and her LDL  was 81 back in January.  She has had intolerance to statins, but has  been able to take Zetia along with her omega-3 supplements.   ALLERGIES:  PENICILLIN.   PRESENT MEDICATIONS:  1. Potassium chloride 20 mEq p.o. daily.  2. Enteric-coated aspirin 81 mg p.o. daily.  3. Digoxin 0.25 mg p.o. daily.  4. Coumadin as directed by the Coumadin Clinic to achieve a      therapeutic INR of 2.0-3.0.  5. Lasix 20 mg p.o. daily.  6. Famotidine 20 mg p.o. b.i.d.  7. Lisinopril 2.5 mg p.o. daily.  8. Zetia 10 mg p.o. daily.  9. Carvedilol 12.5 mg p.o. b.i.d.  10.Multivitamin 1 p.o. daily.  11.Omega-3 supplements 1000 mg p.o. daily.  12.Sublingual Nitroglycerin 0.4 g p.r.n.  13.Motrin p.r.n.   REVIEW OF SYSTEMS:  As outlined  above.  She also complains of feeling of  bubbles in her ankles and general weakness and soreness of her  shoulders and arms particularly when she lifts her arms up.  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   PHYSICAL EXAMINATION:  VITAL SIGNS:  Blood pressure 108/72, heart rate  is 62, weight is 195 pounds down 3 pounds from her last visit.  GENERAL:  She is in no acute distress.  NECK:  No loud bruit.  No thyromegaly.  LUNGS:  Clear without labored breathing.  CARDIAC:  Regular rate and rhythm.  No pathologic systolic murmur.  No  obvious S3 gallop.  EXTREMITIES:  Exhibit no frank pitting edema.   IMPRESSION AND RECOMMENDATIONS:  1. History of orthostatic hypotension, improved with medication      adjustments.  We will plan to keep her completely off Aldactone for      the time being.  She has had no recent episodes of frank syncope.  2. Ischemic heart disease with left  ventricular ejection fraction of      40-45% based on echocardiography from October of last year.  She is      status post defibrillator placement and has known occlusion of the      diagonal branch with diffuse left anterior descending disease,      occluded circumflex, otherwise no obstructive disease in the right      coronary artery based on previous angiography.  Symptomatically,      she has been relatively stable.  The remainder of her medical      regimen will be continued.  I would like a follow up echocardiogram      to reassess left ventricular systolic function.  This has generally      improved over time on medical therapy.  3. History of anticardiolipin syndrome, on chronic Coumadin.  4. Known occlusion of the right internal carotid artery with mild      nonobstructive atherosclerosis within the left internal carotid      artery.  This was stable by Dopplers in January.  Follow up study      can be obtained in a year.  5.  Hyperlipidemia with statin intolerance.  We will plan to continue      Zetia and advanced omega-3 supplements to 1000 mg p.o. b.i.d.  6. Regarding her shoulder and arm weakness and discomfort, I have      asked her to follow up with Dr. [name].  Her symptoms do not      appear to be vascular based on her description.     [name], MD  Electronically Signed    SGM/MedQ  DD: 03/17/2009  DT: 03/18/2009  Job #: [id number]   cc:   Ramon Dredge L. [name], M.D.

## 2011-05-14 NOTE — Assessment & Plan Note (Signed)
[facility]                       [place] CARDIOLOGY OFFICE NOTE   NAME:HOOVERAmatullah, Christy                       MRN:          [id number]  DATE:01/11/2009                            DOB:          12-14-1961    PRIMARY CARE PHYSICIAN:  Ramon Dredge L. [name], MD   REASON FOR VISIT:  Walk in visit.   HISTORY OF PRESENT ILLNESS:  I saw Ms. Spilker for the first time back in  October 2009.  Her history is well detailed in the previous note.  Since  then, she was evaluated in our Electrophysiology Clinic by Dr. Ladona Ridgel  and was overall felt to be doing fairly well.  She did mention some  dizziness at that time, and he suggested that she might decrease her  Lasix from 40 mg to 20 mg a day, although she did not make this change.  She presented to the office as a walk-in today complaining of  orthostatic dizziness, but no frank syncope.  This is particularly noted  when she stands up quickly, sometimes when she is standing up in church.  Following her last visit, we did arrange an echocardiogram, which  revealed improvement in left ventricular systolic function from prior  baseline, now up to 40-45% with anteroseptal and periapical akinesis,  mild mitral regurgitation, and mild-to-moderate left atrial chamber  enlargement.  Labs were done demonstrating an LDL of 128.  Given a  substantial intolerance to statin medications, we did elect to try  Zetia, which she is taking now and tolerating fairly well.  Today, we  reviewed her medications.  She did have orthostatic changes documented  with blood pressure of 112/72 supine, up to 121/74 seated, and down to  100/72 standing, difficult to accurately measure after standing for  several minutes.  Her heart rate also increased from 74 up to 105 with  orthostatic change.  She was dizzy with these maneuvers.   ALLERGIES:  PENICILLIN.   PRESENT MEDICATIONS:  1. Aldactone 25 mg p.o. daily.  2. Potassium chloride 20 mEq  p.o. daily.  3. Enteric-coated aspirin 81 mg p.o. daily.  4. Digoxin 0.25 mg p.o. daily.  5. Imdur 30 mg one-half tablet p.o. b.i.d.  6. Coumadin as directed by the Coumadin Clinic.  7. Lasix 40 mg p.o. daily.  8. Famotidine 20 mg p.o. b.i.d.  9. Metoprolol 50 mg p.o. b.i.d.  10.Lisinopril 5 mg p.o. nightly.  11.Lunesta 2 mg p.o. nightly.  12.Zetia 10 mg p.o. daily.  13.Sublingual nitroglycerin p.r.n.   REVIEW OF SYSTEMS:  As outlined above.  She has occasional angina, but  nothing progressive.  No palpitations or syncope.  She is continuing to  work on weight loss through [employer], otherwise negative.   PHYSICAL EXAMINATION:  VITAL SIGNS:  Blood pressure 112/72, heart rate  is 78, weight is 195 pounds.  GENERAL:  Overweight woman in no acute distress.  HEENT:  Conjunctivae normal.  Oropharynx was clear.  NECK:  Supple.  No thyromegaly.  No loud carotid bruits.  LUNGS:  Clear without labored breathing.  CARDIAC:  Regular rate and rhythm.  No S3 gallop.  Indistinct PMI.  ABDOMEN:  Soft, nontender, good bowel sounds.  EXTREMITIES:  No frank pitting edema, distal pulses 2+.  SKIN:  Warm and dry.  MUSCULOSKELETAL:  No kyphosis noted.  NEUROPSYCHIATRIC:  The patient is alert and oriented x3.  Affect is  appropriate.   IMPRESSION AND RECOMMENDATIONS:  Orthostatic dizziness with documented  drop in blood pressure and increase in heart rate.  In the setting of  her continued weight loss through diet and also relative improvement in  her ejection fraction, it may well be that we need to continue to down  titrate some of her medical therapy.  I agree with Dr. Lubertha Basque  recommendation for decreasing Lasix to 20 mg in the morning and she may  in fact be able to discontinue this altogether if her weights remains  stable and she has no symptomatic edema, particularly in light of the  fact that she is already on Aldactone.  I have also asked her to  decrease lisinopril to 2.5 mg in  the evening and cut Imdur to one-half  tablet once daily instead of twice daily.  We will keep her other  medicines the same, and I will plan to have her followup over the next  few weeks with a BMET and also a nursing visit for repeat orthostatic  measurements.  She will continue regular followup with the Coumadin  Clinic for assessment of her INR, further medication adjustments as  needed.     [name], MD  Electronically Signed    SGM/MedQ  DD: 01/11/2009  DT: 01/12/2009  Job #: [id number]   cc:   Ramon Dredge L. [name], M.D.

## 2011-05-14 NOTE — Assessment & Plan Note (Signed)
[facility]                       [place] CARDIOLOGY OFFICE NOTE   NAME:[name] [name], [name] [name]                       MRN:          [id number]  DATE:10/25/2008                            DOB:          1961/10/13    PRIMARY CARE PHYSICIAN:  Ramon Dredge L. [name], MD.   REASON FOR VISIT:  Scheduled followup.   HISTORY OF PRESENT ILLNESS:  This is my first meeting with [name].  She is a fairly complicated woman previously followed by Dr. Eden Emms and  last seen in April 2009.  In reviewing her history, she has previous  documentation of anticardiolipin syndrome maintained on chronic  Coumadin, prior history of illicit substance abuse including cocaine,  active tobacco abuse, previous stroke with an occluded right carotid  artery, hypertension, and ischemic cardiomyopathy with an ejection  fraction previously documented at 30% with history of nonsustained  ventricular tachycardia status post defibrillator placement.  Previous  invasive cardiac testing was done at a facility in [place], [place].  These records are placed in the back of the chart and have  already been reviewed.  She was noted to have an occluded diagonal  branch with diffuse left anterior descending disease and an occluded  circumflex with no obstructive disease in the right coronary artery.  This was all felt to be best managed medically.  She last saw Dr. Ladona Ridgel  for device followup in March 2009 and is due to see him back in next  month.  She has a Guidant dual-chamber system.  She denies having any  device discharges.  In general, she reports feeling fairly well.  She  has been able to lose approximately 38 pounds through [employer].  She did not tolerate cardiac rehabilitation stating that she was having  some dizziness when she would raise her hands over her head.  She  reports seeing Dr. Vickey Huger for neurological evaluation at some point  along the way, although this  evaluation has been incomplete.  She has  not followed up for further testing.  Today, her INR in clinic was 2.7.  She has cut back her tobacco use to one-half pack per day, but has not  been able to quit entirely.  We did discuss this today.  I reviewed her  medications.  She has switched some of the times of day that she takes  certain ones and seems to feel better in terms of her blood pressure  without any significant dizziness.  She is not reporting any anginal  chest pain.  There has not been a recent followup echocardiogram over  the last year.   ALLERGIES:  PENICILLIN.   PRESENT MEDICATIONS:  1. Aldactone 25 mg p.o. daily.  2. Potassium chloride 20 mEq mg p.o. daily.  3. Enteric-coated aspirin 81 mg p.o. daily.  4. Digoxin 0.25 mg p.o. q.p.m.  5. Imdur 15 mg p.o. b.i.d.  6. Coumadin as directed by the [facility].  7. Lasix 40 mg p.o. daily.  8. Famotidine 20 mg p.o. b.i.d.  9. Metoprolol 50 mg p.o. b.i.d.  10.Lisinopril 5 mg p.o. q.p.m.  11.Motrin.  12.Lorazepam.  13.Nitroglycerin spray p.r.n.   REVIEW OF SYSTEMS:  As oulined above.  Otherwise, negative.   PHYSICAL EXAMINATION:  VITAL SIGNS:  Blood pressure is 110/76; heart  rate is 60; and weight is 193 pounds, down from 231 back in April.  GENERAL:  The patient is comfortable in no acute distress.  HEENT:  Conjunctivae are normal.  Oropharynx is clear.  NECK:  Supple.  No loud carotid bruits.  No thyromegaly.  LUNGS:  Generally clear without labored breathing.  CARDIAC:  A regular rate and rhythm.  No obvious S3.  PMI is indistinct.  Examination of thorax reveals a stable pacemaker pocket in the left  upper chest.  ABDOMEN:  Soft.  Bowel sounds present.  No tenderness noted.  EXTREMITIES:  Exhibit distal pulses.  No frank pitting edema.  SKIN:  Warm and dry.  MUSCULOSKELETAL:  No kyphosis noted.  NEUROPSYCHIATRIC:  The patient is alert and oriented x3.  Affect seems  appropriate.   IMPRESSION AND  RECOMMENDATIONS:  1. Ischemic cardiomyopathy with previous ejection fraction of      approximately 30% status post defibrillator placement with      previously documented nonsustained ventricular tachycardia.  She      denies any device discharges or frank angina at this time.  She      generally relates feeling fairly well on medical therapy.  I agree      with her present medical regiment and she has good heart rate and      blood pressure control at this time.  She should followup with Dr.      Ladona Ridgel in November as already arranged for device followup.  In the      meanwhile, I would like a followup echocardiogram for reassessment      of left ventricular function.  Her followup will be scheduled for 6      months.  2. History of anticardiolipin syndrome on chronic Coumadin.  This is      to follow up with the [facility].  INR is therapeutic today.  3. Previous history of stroke with known occlusion of the right      carotid artery.  Dr. Eden Emms had suggested to follow up carotid      duplex in 2 years from his visit in April mainly to assess the left      system.  She continues on aspirin and Coumadin.  4. Ongoing tobacco abuse.  We discussed critical importance of smoking      cessation.  She states she is trying to cut back.  5. History of hyperlipidemia with previous STATIN intolerance      including PRAVASTATIN and SIMVASTATIN (muscle ache).  We will      assess whether to follow up lipid profile and determine if any      additional therapy should be tried.     [name], MD  Electronically Signed   SGM/MedQ  DD: 10/25/2008  DT: 10/26/2008  Job #: [id number]   cc:   Ramon Dredge L. [name], M.D.

## 2011-05-14 NOTE — Assessment & Plan Note (Signed)
[facility]                         ELECTROPHYSIOLOGY OFFICE NOTE   NAME:HOOVERNatacia, Chaisson                       MRN:          [id number]  DATE:12/07/2008                            DOB:          [birth year]    Ms. Robey returns today for followup.  She is a very pleasant female with  a history of a ischemic cardiomyopathy, congestive heart failure, prior  stroke, and hypertension.  She returns today for followup.  She denies  chest pain.  She has had no significant shortness of breath.  She does  have some dizziness and lightheadedness and as noted that her blood  pressure has been decreased.  Her primary physician, Dr. [name] has  decreased her metoprolol to 50 twice a day down from 100 twice a day.  The patient improved with this, though she still occasionally gets  dizzy.   MEDICINES:  1. Aldactone 25 a day.  2. Potassium 20 a day.  3. Aspirin 81 a day.  4. Digoxin 0.25 daily.  5. Isosorbide 30 mg half tablet twice daily.  6. Furosemide 40 a day.  7. Metoprolol 50 twice a day.  8. Lisinopril 5 at bedtime.  9. She has multiple vitamin.   PHYSICAL EXAMINATION:  GENERAL:  She is a pleasant, middle-aged woman in  no distress.  VITAL SIGNS:  Blood pressure was 100/54, the pulse 60 and regular,  respirations were 18, the weight was 192 pounds.  NECK:  No jugular venous distention.  LUNGS:  Clear bilaterally to auscultation.  No wheezes, rales, or  rhonchi are present.  CARDIOVASCULAR:  Regular rate and rhythm.  Normal S1 and S2.  ABDOMEN:  Soft, nontender.  EXTREMITIES:  No edema.   Interrogation of her defibrillator demonstrates [employer]  device.  P-waves were 4, the R-waves greater than 25, the impedance 646  in the A, 620 in the V.  The threshold 0.6 at 0.5 in both the right  atrium and the right ventricle.  Battery voltage was 3.01 volts (BOL).  There are no intercurrent ICD therapies.  She was 1% A-paced, none LV  pacing was  present.   IMPRESSION:  1. Ischemic cardiomyopathy.  2. Congestive heart failure (class I-II).  3. Remote history of stroke.  4. Status post implantable cardioverter-defibrillator insertion.   DISCUSSION:  Overall, Ms. Vandoren is stable and her defibrillator is  working normally.  There are no intercurrent ICD therapies.  With regard  to his symptoms of dizziness, I have asked that she decrease her  furosemide from 40 mg a day to 20 mg a day.  Consideration for reduction  in other medications will be forthcoming.  Note, the patient has lost  over 40 pounds in the last 6 months by exercise and weight loss and  dieting.     Doylene Canning. Ladona Ridgel, MD  Electronically Signed    GWT/MedQ  DD: 12/07/2008  DT: 12/07/2008  Job #: [id number]   cc:   Ramon Dredge L. [name], M.D.

## 2011-05-14 NOTE — Letter (Signed)
December 27, 2009   Sherilyn Cooter A. Pool, MD  [address]  [place], [place] [address]   Re:  CARNETTA, LOSADA                DOB:  Feb 20, 1961   Dear Sherilyn Cooter,   I saw the patient back in the office today.  Her incisions are well  healed.  We removed all her Steri-Strips.  Her chest x-ray looks good.  She went to the emergency room yesterday with chest pain and they got a  CT scan that really looks good.  We had ruled out a pulmonary embolus  which probably was little overkilled, but anyway she looks great and  told her to gradually increase her activities, and we will see her back  again in 6 weeks with the chest x-ray.   Ines Bloomer, M.D.  Electronically Signed   DPB/MEDQ  D:  12/27/2009  T:  12/28/2009  Job:  [id number]

## 2011-05-14 NOTE — Assessment & Plan Note (Signed)
[name]                         ELECTROPHYSIOLOGY OFFICE NOTE   NAME:HOOVERElexa, Kivi                       MRN:          [id number]  DATE:03/25/2008                            DOB:          08-11-1961    [name] [name] returns today for followup.  She is a very pleasant, 50-year-  old woman with a history of an ischemic cardiomyopathy, congestive heart  failure, coronary disease, status post silent MI with a anticardiolipin  syndrome.  She has a history of longstanding tobacco abuse and  polysubstance abuse, though she states that she stopped smoking  cigarettes years ago.  She also with a history of cerebrovascular  disease with 100% occlusion of her right carotid artery.  She is status  post ICD insertion in November of 2008 secondary to all the above with  an ejection fraction of 25%.  She returns today for followup.  She has  recently moved from [place], where she previously lived in the  [address] area to [place], [place].  She denies any  intercurrent IC therapies.   ADDITIONAL PAST MEDICAL HISTORY:  Notable for thoracentesis and 2-vessel  coronary disease as previously noted.  The patient has a history of  Raynaud's.   FAMILY HISTORY:  Notable for her father being alive with marked heart  coronary disease.  Her brother has had an MI.  There is clearly a  positive family history of premature coronary disease.  The patient is  trying to become disabled.  She is not able to work.  She is quite  sedentary.   SOCIAL HISTORY:  The patient lives in [place] in a trailer  that is owned by her parents.  She continues to smoke cigarettes.  She  denies alcohol abuse.  Her review of systems is as noted in the HPI.  She has class II heart failure symptoms.  She denies much in the way of  peripheral edema.  The rest of her review of systems was unremarkable.   PHYSICAL EXAMINATION:  She is a pleasant well-appearing 50 year old  woman who is no acute distress.  Blood pressure was 130/80, the pulse  was 76 and regular, respirations were 18.  HEENT:  Normocephalic,atraumatic.  Pupils are round.  The oropharynx is  moist.  The sclerae are anicteric.  Trachea was midline.  There was no  thyromegaly.  LUNGS:  Clear bilaterally to auscultation.  No wheezes, rales or  rhonchi.  There is no increased work of breathing.  CARDIOVASCULAR EXAM:  Regular rate and rhythm with normal S1 and S2.  There are no murmurs, rubs, or gallops.  The PMI was enlarged and  laterally displaced.  I did not appreciated an S3 or S4 gallop.  ABDOMEN:  Soft, nontender, nondistended.  There was no organomegaly.  Bowel sounds were present.  There was no rebound or guarding.  EXTREMITIES:  Demonstrated no cyanosis, clubbing or edema.  Pulses were  2+ and symmetric.  NEUROLOGIC:  Alert and oriented x3.  Cranial nerves were intact.  Strength was 5/5 and symmetric.   Interrogation of her defibrillator demonstrates a Guidant  dual-chamber  device.  The P-waves were 3, the R waves greater than 25.  The impedance  633 in the A, 548 at the V, the threshold 0.8 at 0.5 in the atrium and  0.6 and 0.15 in the right ventricle.  Battery voltage was 2.17 volts.  There are no intercurrent IC therapies.  Today her outputs were turned  down to 2.5 in the A and 2.4 and 0.5 in the RV.   IMPRESSION:  1. Ischemic cardiomyopathy.  2. Anticardiolipin antibody syndrome.  3. Cerebrovascular disease with occluded right carotid.  4. Probable Raynaud's phenomena.   DISCUSSION:  Overall [name] [name] is stable and her defibrillator is  working normally.  I will plan to follow her up in November of 2009 for  ICD check.  If she is experiences any ICD shocks, she is instructed to  call us.     Doylene Canning. Ladona Ridgel, MD  Electronically Signed    GWT/MedQ  DD: 03/25/2008  DT: 03/26/2008  Job #: [id number]   cc:   Ramon Dredge L. [name], M.D.

## 2011-05-14 NOTE — Letter (Signed)
February 07, 2010   Sherilyn Cooter A. Pool, MD  [address]  [place], [place] [address]   Re:  EMARIE, PAUL                DOB:  [birth year]   Dear Sherilyn Cooter:   I saw the patient back today and her chest x-ray showed no effusion.  Her cage and spacers were in good position before she had the L1  corpectomy or L1 burst fracture.  She is doing well overall, having some  mild pain, but really looks good.  I will see her back again in 2 months  with a chest x-ray.  Her blood pressure was 123/68, pulse 74,  respirations 18, and sats were 99%.  She has quit smoking.   Sincerely,   Ines Bloomer, M.D.  Electronically Signed   DPB/MEDQ  D:  02/07/2010  T:  02/08/2010  Job:  [id number]

## 2011-05-14 NOTE — Assessment & Plan Note (Signed)
[facility]                       [place] CARDIOLOGY OFFICE NOTE   ROYANNE, WARSHAW                       MRN:          [id number]  DATE:02/16/2009                            DOB:          October 11, 1961    CARDIOLOGIST:  [name], MD   PRIMARY CARE PHYSICIAN:  Oneal Deputy. [name], MD   REASON FOR VISIT:  Orthostatic hypotension.   HISTORY OF PRESENT ILLNESS:  [name] is a 47-year female patient with  a history of ischemic cardiomyopathy with a previous EF of 30%, non-ST-  elevation myocardial infarction in October 2008 evaluated at a facility  in [facility] in [place] (totally occluded diagonal, diffuse  LAD disease, totally occluded circumflex, nonobstructive RCA disease;  all treated medically) who is status post AICD implantation.  The  patient has been seen several times in our office with symptoms of  lightheadedness especially with standing.  She saw Dr. Ladona Ridgel in  December 2009 who decreased her Lasix.  She saw Dr. Diona Browner in followup  on January 11, 2009, and he decreased her lisinopril and cut her Imdur  in half.  She apparently had syncope with standing and was referred over  to the office on February 01, 2009.  Dr. Dietrich Pates evaluated her and it  was noted that her pressure dropped to 95/70 with standing.  Her  isosorbide was discontinued and her metoprolol was changed over to Coreg  12.5 mg b.i.d.  She returns for followup today.  It should be noted that  the patient has intentionally lost a considerable amount of weight over  the last several months.  She continues to smoke and is cutting back on  this.  She has a previous history of polysubstance abuse, and she has  quit all of this.  She denies any chest discomfort.  She does note some  back discomfort from time-to-time with exertion.  This is her chronic  stable angina.  She takes nitroglycerin with prompt relief.  There has  been no significant change in her  symptoms.  She denies any significant  shortness of breath.  She describes NYHA class II symptoms.  She denies  orthopnea, PND, or pedal edema.  She denies any defibrillator therapies.   CURRENT MEDICATIONS:  Aldactone 25 mg daily, Potassium 10 mEq daily,  Aspirin 81 mg daily, Digoxin 0.25 mg daily, Warfarin as directed,  Furosemide 20 mg daily, Famotidine 20 mg b.i.d.,  Lisinopril 2.5 mg daily, Zetia 10 mg daily, Carvedilol 12.5 mg b.i.d.,  Multivitamin daily, Nitroglycerin p.r.n., Motrin p.r.n.   ALLERGIES:  PENICILLIN.   PHYSICAL EXAMINATION:  GENERAL:  She is a well-developed, well-nourished  female in no distress.  VITAL SIGNS:  Blood pressure is 121/77, pulse 80, weight 193 pounds.  ORTHOSTATIC VITAL SIGNS:  Lying 121/77 with a pulse of 80, sitting blood  pressure 114/75 with a pulse of 73, standing blood pressure 111/67 with  a pulse of 84 after 2 minutes 108/75 with a pulse of 92.  She became  symptomatic and had to sit down after this.  HEENT:  Normal.  NECK:  Without JVD.  CARDIAC:  Normal S1 and S2.  Regular rate and rhythm.  LUNGS:  Clear to auscultation bilaterally.  No rales.  ABDOMEN:  Soft, nontender.  EXTREMITIES:  Without edema.  NEUROLOGIC:  She is alert and oriented x3.  Cranial nerves II through  XII grossly intact.   Database labs drawn on February 03, 2009, hemoglobin was 12.6, platelet  count 314,000, ferritin 49.  Sodium 141, potassium 4.5, BUN 15,  creatinine 0.64, total cholesterol 142, triglycerides 108, HDL 39, LDL  81.   ASSESSMENT AND PLAN:  1. Orthostatic hypotension.  The patient's symptoms have improved with      the medication adjustments made by Dr. Dietrich Pates.  However, she      continues to have some drop in her blood pressure with standing and      increase in her heart rate.  At this point in time, I have elected      to cut back on her Aldactone to 25 mg half tablet a day.  I      discussed this with Dr. Diona Browner who agreed.  She has a  followup      BMET in a week.  She will come back for orthostatic vital signs      with the nurse in 2 weeks and followup in a month.  2. Coronary artery disease, as previously outlined.  She has stable      angina that she describes as back pain.  There has been no      significant change since her MI.  She will continue on above-stated      medications.  Of note, she has not had any increase in her symptoms      since her isosorbide was discontinued.  3. Dyslipidemia.  Her recent LDL was near goal.  She cannot tolerate      statins.  Her HDL was still low and I recommended fish oil 1 g      daily.  She can have followup with LFTs in 3 months.  4. Cerebrovascular disease.  She has a known totally occluded right      internal carotid artery.  She had 0-39% stenosis on carotid Doppler      done in January 2009.  She has described some visual disturbance      with the drops in her blood pressure when she stands.  This is      mainly a right-sided visual field cut.  She was having these      symptoms prior to her stroke 5 years ago.  There has really been no      significant change.  These symptoms come and go from time to time.      I suspect this is probably be related to her transient hypotension      with standing.  We can reassess this when she returns in followup.      She had recent follow up carotid dopplers at [facility] in      [place].  We will obtain those results.   DISPOSITION:  Follow up with me or Dr. Diona Browner in 1 month or sooner  p.r.n.      Tereso Newcomer, PA-C  Electronically Signed      [name], MD  Electronically Signed   SW/MedQ  DD: 02/16/2009  DT: 02/17/2009  Job #: [id number]   cc:   Ramon Dredge L. [name], M.D.

## 2011-06-06 ENCOUNTER — Ambulatory Visit (INDEPENDENT_AMBULATORY_CARE_PROVIDER_SITE_OTHER): Payer: PRIVATE HEALTH INSURANCE | Admitting: *Deleted

## 2011-06-06 ENCOUNTER — Ambulatory Visit (INDEPENDENT_AMBULATORY_CARE_PROVIDER_SITE_OTHER): Payer: PRIVATE HEALTH INSURANCE | Admitting: Cardiology

## 2011-06-06 ENCOUNTER — Encounter: Payer: Self-pay | Admitting: Cardiology

## 2011-06-06 VITALS — BP 120/63 | HR 75 | Wt 208.0 lb

## 2011-06-06 DIAGNOSIS — I2589 Other forms of chronic ischemic heart disease: Secondary | ICD-10-CM

## 2011-06-06 DIAGNOSIS — Z7901 Long term (current) use of anticoagulants: Secondary | ICD-10-CM

## 2011-06-06 DIAGNOSIS — R894 Abnormal immunological findings in specimens from other organs, systems and tissues: Secondary | ICD-10-CM

## 2011-06-06 DIAGNOSIS — I679 Cerebrovascular disease, unspecified: Secondary | ICD-10-CM

## 2011-06-06 DIAGNOSIS — I10 Essential (primary) hypertension: Secondary | ICD-10-CM

## 2011-06-06 LAB — POCT INR: INR: 3.3

## 2011-06-06 MED ORDER — FUROSEMIDE 20 MG PO TABS: ORAL_TABLET | ORAL | Status: DC

## 2011-06-06 NOTE — Assessment & Plan Note (Signed)
Continue Coumadin, followed in the Coumadin clinic. No reported major bleeding problems.

## 2011-06-06 NOTE — Progress Notes (Signed)
Clinical Summary [name] is a 50 y.o.female presenting for followup. She was seen in February of this year.  Since last visit, she has gained approximately 20 pounds. She attributes this to no regular exercise and poor diet. She indicates stress at work.  She has also been traveling a lot, eating out. States she has had some pedal edema in this setting. It typically resolves which puts her feet up at nighttime. She was wondering whether she could take a slightly higher dose of Lasix.  Does have occasional chest pain, likely angina and also recurrent indigestion symptoms. Most recent stress test is noted below.  She states she has started smoking again.   Allergies  Allergen Reactions  . Penicillins     REACTION: shock    Current outpatient prescriptions:aspirin 81 MG tablet, Take 81 mg by mouth daily.  , Disp: , Rfl: ;  carvedilol (COREG) 12.5 MG tablet, Take 12.5 mg by mouth 2 (two) times daily with a meal.  , Disp: , Rfl: ;  digoxin (LANOXIN) 0.25 MG tablet, Take 1 tablet (250 mcg total) by mouth daily., Disp: 30 tablet, Rfl: 3;  famotidine (PEPCID) 20 MG tablet, Take 20 mg by mouth 2 (two) times daily.  , Disp: , Rfl:  furosemide (LASIX) 20 MG tablet, ALTERNATE 1 TABLET DAILY WITH 1/2 TABLET EVERY OTHER DAY, Disp: 45 tablet, Rfl: 3;  HYDROcodone-acetaminophen (NORCO) 10-325 MG per tablet, Take 1 tablet by mouth every 6 (six) hours as needed. Take 1/2 tab bid  , Disp: , Rfl: ;  ibuprofen (ADVIL,MOTRIN) 200 MG tablet, Take 200 mg by mouth every 6 (six) hours as needed.  , Disp: , Rfl:  lisinopril (PRINIVIL,ZESTRIL) 2.5 MG tablet, Take 2.5 mg by mouth daily.  , Disp: , Rfl: ;  nitroGLYCERIN (NITROSTAT) 0.4 MG SL tablet, Place 0.4 mg under the tongue every 5 (five) minutes as needed.  , Disp: , Rfl: ;  potassium chloride SA (K-DUR,KLOR-CON) 20 MEQ tablet, Take 20 mEq by mouth daily.  , Disp: , Rfl: ;  warfarin (COUMADIN) 10 MG tablet, Take 10 mg by mouth daily.  , Disp: , Rfl:  DISCONTD:  furosemide (LASIX) 20 MG tablet, Take 20 mg by mouth daily. Take 1/2 tab daily , Disp: , Rfl:   Past Medical History  Diagnosis Date  . History of pneumonia   . Chronic systolic heart failure   . 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 Coronary atherosclerosis of native coronary artery     Severe two vessel disease   . Cocaine abuse in remission   . Raynaud's phenomenon   . Essential hypertension, benign   . Hyperlipidemia   . Mixed hyperlipidemia     LVEF 30-35%  . Anticardiolipin antibody positive     Chronic Coumadin    Past Surgical History  Procedure Date  . Cardiac defibrillator placement     St.Jude ICD  . Appendectomy   . Laryngeal polyp excision   . L1 corpectomy  12/10    No family history on file.  Social History [name] reports that she has quit smoking. Her smoking use included Cigarettes. She has never used smokeless tobacco. [name] reports that she does not drink alcohol.  Review of Systems Also problems with chronic back pain, tingling in her fingers when she wakes up in the morning, likely neuropathic. No headaches or speech deficits. Otherwise negative.  Physical Examination Filed Vitals:   06/06/11 1449  BP: 120/63  Pulse: 75   Additional Exam: Overweight woman in no acute distress without active chest pain.  HEENT: Conjunctiva and lids are normal, oropharynx clear.  Neck: Supple no loud carotid bruits, very soft bruit on the right.  Lungs: Clear to auscultation without labored breathing.  Cardiac: Regular rate and rhythm, no S3 gallop. PMI indistinct. No loud systolic murmur.  Abdomen: Soft nontender, no hepatomegalyl. Bowel sounds present.  Extremities: Diminished dorsalis pedis pulses bilaterally. Mild pedal edema.  Musculoskeletal: No kyphosis noted.  Skin: Warm and dry.  Neuropsychiatric: Patient alert and oriented x3, affect is grossly normal.    Studies Echocardiogram 02/28/2011: - Left ventricle: The cavity  size was at the upper limits of normal.     Wall thickness was increased in a pattern of mild LVH. Systolic     function was moderately to severely reduced. The estimated     ejection fraction was in the range of 30% to 35%. There is     akinesis of the mid-distal anteroseptal and apical myocardium.     There is akinesis of the distal inferoseptal myocardium. Doppler     parameters are consistent with abnormal left ventricular     relaxation (grade 1 diastolic dysfunction).   - Mitral valve: Mild to moderate regurgitation.   - Left atrium: The atrium was mildly dilated.   - Right ventricle: Pacer wire or catheter noted in right ventricle.   - Right atrium: Pacer wire or catheter noted in right atrium.   - Tricuspid valve: Mild regurgitation.   - Pericardium, extracardiac: There was no pericardial effusion.  Carotid Dopplers 02/28/2011:  No flow identified within the right ICA, which appears occluded   beginning just beyond its origin, similar to the previous study.   Ulcerated plaque noted at right carotid bulb.   Plaque formation left carotid bulb into proximal left ICA without   evidence of hemodynamically significant stenosis.   Increased peak systolic velocities in the external carotid   arteries, slightly increased from previous study.  [facility] Myoview 06/14/2010: Abnormal pharmacologic stress nuclear myocardial study revealing a   nondiagnostic EKG due to the presence of baseline abnormalities,   mild to moderate left ventricular dilatation and severe impairment   of left ventricular systolic function in a segmental pattern.  By   scintigraphic imaging, there was extensive infarction in the   territory of the left anterior descending coronary artery as well   as additional scarring at the base of the posterolateral wall.  No   myocardial ischemia identified.  Other findings as noted.  Problem List and Plan

## 2011-06-06 NOTE — Assessment & Plan Note (Signed)
Blood pressure looks good today. 

## 2011-06-06 NOTE — Assessment & Plan Note (Signed)
Chronically occluded right internal carotid artery, with nonobstructive atherosclerosis on the left.

## 2011-06-06 NOTE — Assessment & Plan Note (Signed)
Relatively stable without progressive angina or unusual shortness of breath. She does have an increase in weight likely attributed to poor diet and inadequate exercise. Some increased pedal edema as well. We decided that she could try Lasix at 20 mg every other day alternating with low dose Lasix 10 mg. I asked her to keep an eye on her blood pressure and weight. We also discussed paying more attention to diet, weight loss, exercise, also getting back to her prior smoking cessation. She states that things have "slipped" over the last few months.

## 2011-06-06 NOTE — Patient Instructions (Signed)
Your physician recommends that you schedule a follow-up appointment in:4 MONTHS Your physician has recommended you make the following change in your medication: MAY ALTERNATE FUROSEMIDE 10MG -20MG  EVERY OTHER DAY

## 2011-07-08 ENCOUNTER — Ambulatory Visit (INDEPENDENT_AMBULATORY_CARE_PROVIDER_SITE_OTHER): Payer: PRIVATE HEALTH INSURANCE | Admitting: *Deleted

## 2011-07-08 DIAGNOSIS — R894 Abnormal immunological findings in specimens from other organs, systems and tissues: Secondary | ICD-10-CM

## 2011-07-08 DIAGNOSIS — Z7901 Long term (current) use of anticoagulants: Secondary | ICD-10-CM

## 2011-07-08 DIAGNOSIS — I679 Cerebrovascular disease, unspecified: Secondary | ICD-10-CM

## 2011-07-08 LAB — POCT INR: INR: 1.5

## 2011-07-24 ENCOUNTER — Other Ambulatory Visit (HOSPITAL_COMMUNITY): Payer: Self-pay | Admitting: Otolaryngology

## 2011-07-30 ENCOUNTER — Other Ambulatory Visit (HOSPITAL_COMMUNITY): Payer: Self-pay | Admitting: Pulmonary Disease

## 2011-07-31 ENCOUNTER — Ambulatory Visit (HOSPITAL_COMMUNITY)
Admission: RE | Admit: 2011-07-31 | Discharge: 2011-07-31 | Disposition: A | Discharge status: Home / Self Care | Payer: PRIVATE HEALTH INSURANCE | Source: Ambulatory Visit | Attending: Otolaryngology | Admitting: Otolaryngology

## 2011-07-31 ENCOUNTER — Ambulatory Visit (HOSPITAL_COMMUNITY): Admission: RE | Admit: 2011-07-31 | Payer: PRIVATE HEALTH INSURANCE | Source: Ambulatory Visit

## 2011-07-31 ENCOUNTER — Encounter: Payer: PRIVATE HEALTH INSURANCE | Admitting: *Deleted

## 2011-07-31 ENCOUNTER — Other Ambulatory Visit: Payer: Self-pay | Admitting: Cardiovascular Disease

## 2011-07-31 DIAGNOSIS — Z538 Procedure and treatment not carried out for other reasons: Secondary | ICD-10-CM | POA: Insufficient documentation

## 2011-07-31 DIAGNOSIS — R1310 Dysphagia, unspecified: Secondary | ICD-10-CM | POA: Insufficient documentation

## 2011-08-01 ENCOUNTER — Ambulatory Visit (HOSPITAL_COMMUNITY): Admission: RE | Admit: 2011-08-01 | Payer: PRIVATE HEALTH INSURANCE | Source: Ambulatory Visit

## 2011-08-01 ENCOUNTER — Other Ambulatory Visit (HOSPITAL_COMMUNITY): Payer: Self-pay | Admitting: Neurosurgery

## 2011-08-01 DIAGNOSIS — M4320 Fusion of spine, site unspecified: Secondary | ICD-10-CM

## 2011-08-01 DIAGNOSIS — M549 Dorsalgia, unspecified: Secondary | ICD-10-CM

## 2011-08-05 ENCOUNTER — Ambulatory Visit (HOSPITAL_COMMUNITY)
Admission: RE | Admit: 2011-08-05 | Discharge: 2011-08-05 | Disposition: A | Discharge status: Home / Self Care | Payer: PRIVATE HEALTH INSURANCE | Source: Ambulatory Visit | Attending: Neurosurgery | Admitting: Neurosurgery

## 2011-08-05 DIAGNOSIS — M47814 Spondylosis without myelopathy or radiculopathy, thoracic region: Secondary | ICD-10-CM | POA: Insufficient documentation

## 2011-08-05 DIAGNOSIS — M549 Dorsalgia, unspecified: Secondary | ICD-10-CM

## 2011-08-05 DIAGNOSIS — M545 Low back pain, unspecified: Secondary | ICD-10-CM | POA: Insufficient documentation

## 2011-08-05 DIAGNOSIS — M546 Pain in thoracic spine: Secondary | ICD-10-CM | POA: Insufficient documentation

## 2011-08-05 DIAGNOSIS — M4320 Fusion of spine, site unspecified: Secondary | ICD-10-CM

## 2011-08-29 ENCOUNTER — Ambulatory Visit (HOSPITAL_COMMUNITY)
Admission: RE | Admit: 2011-08-29 | Discharge: 2011-08-29 | Disposition: A | Discharge status: Home / Self Care | Payer: PRIVATE HEALTH INSURANCE | Source: Ambulatory Visit | Attending: Pulmonary Disease | Admitting: Pulmonary Disease

## 2011-08-29 DIAGNOSIS — K8020 Calculus of gallbladder without cholecystitis without obstruction: Secondary | ICD-10-CM | POA: Insufficient documentation

## 2011-08-29 DIAGNOSIS — R1032 Left lower quadrant pain: Secondary | ICD-10-CM | POA: Insufficient documentation

## 2011-08-29 MED ORDER — IOHEXOL 300 MG/ML  SOLN
100.0000 mL | Freq: Once | INTRAMUSCULAR | Status: AC | PRN
  Administered 2011-08-29: 100 mL via INTRAVENOUS

## 2011-09-11 ENCOUNTER — Encounter: Payer: Self-pay | Admitting: Internal Medicine

## 2011-09-26 LAB — BASIC METABOLIC PANEL
BUN: 15
CO2: 29
Calcium: 8.9
Chloride: 101
Creatinine, Ser: 0.83
GFR calc Af Amer: >60
GFR calc non Af Amer: >60
Glucose, Bld: 98
Potassium: 4.1
Sodium: 133 — ABNORMAL LOW

## 2011-09-26 LAB — DIFFERENTIAL
Basophils Absolute: 0
Basophils Relative: 0
Eosinophils Absolute: 0.2
Eosinophils Relative: 2
Lymphocytes Relative: 32
Lymphs Abs: 2.8
Monocytes Absolute: 0.6
Monocytes Relative: 7
Neutro Abs: 5.1
Neutrophils Relative %: 59

## 2011-09-26 LAB — CBC
HCT: 41.7
Hemoglobin: 14.5
MCHC: 34.9
MCV: 87.5
Platelets: 182
RBC: 4.76
RDW: 14.1
WBC: 8.7

## 2011-09-26 LAB — DIGOXIN LEVEL: Digoxin Level: 1.1

## 2011-09-26 LAB — PROTIME-INR
INR: 4.6 — ABNORMAL HIGH
Prothrombin Time: 45.9 — ABNORMAL HIGH

## 2011-10-17 ENCOUNTER — Other Ambulatory Visit: Payer: Self-pay | Admitting: Neurosurgery

## 2011-10-17 DIAGNOSIS — M545 Low back pain, unspecified: Secondary | ICD-10-CM

## 2011-10-30 ENCOUNTER — Ambulatory Visit: Payer: PRIVATE HEALTH INSURANCE | Admitting: Cardiology

## 2011-10-30 ENCOUNTER — Encounter: Payer: PRIVATE HEALTH INSURANCE | Admitting: *Deleted

## 2011-11-06 ENCOUNTER — Telehealth: Payer: Self-pay | Admitting: Cardiology

## 2011-11-06 ENCOUNTER — Encounter: Payer: Self-pay | Admitting: Cardiology

## 2011-11-06 NOTE — Telephone Encounter (Signed)
Called patient to reschedule appointment for ov and inr.  No answer, unable to leave a message.  Sending letter.

## 2011-11-09 ENCOUNTER — Other Ambulatory Visit: Payer: Self-pay | Admitting: Adult Health

## 2011-11-09 ENCOUNTER — Other Ambulatory Visit: Payer: Self-pay | Admitting: Cardiology

## 2011-12-07 ENCOUNTER — Other Ambulatory Visit: Payer: Self-pay | Admitting: Cardiology

## 2011-12-12 ENCOUNTER — Encounter: Payer: Self-pay | Admitting: Adult Health

## 2011-12-12 ENCOUNTER — Ambulatory Visit (INDEPENDENT_AMBULATORY_CARE_PROVIDER_SITE_OTHER): Payer: PRIVATE HEALTH INSURANCE | Admitting: Cardiology

## 2011-12-12 ENCOUNTER — Ambulatory Visit (INDEPENDENT_AMBULATORY_CARE_PROVIDER_SITE_OTHER): Payer: PRIVATE HEALTH INSURANCE | Admitting: *Deleted

## 2011-12-12 ENCOUNTER — Encounter: Payer: Self-pay | Admitting: Cardiology

## 2011-12-12 DIAGNOSIS — R894 Abnormal immunological findings in specimens from other organs, systems and tissues: Secondary | ICD-10-CM

## 2011-12-12 DIAGNOSIS — I679 Cerebrovascular disease, unspecified: Secondary | ICD-10-CM

## 2011-12-12 DIAGNOSIS — E785 Hyperlipidemia, unspecified: Secondary | ICD-10-CM

## 2011-12-12 DIAGNOSIS — Z7901 Long term (current) use of anticoagulants: Secondary | ICD-10-CM

## 2011-12-12 DIAGNOSIS — I2589 Other forms of chronic ischemic heart disease: Secondary | ICD-10-CM

## 2011-12-12 LAB — POCT INR: INR: 2.6

## 2011-12-12 MED ORDER — ISOSORBIDE MONONITRATE ER 30 MG PO TB24: 30.0000 mg | ORAL_TABLET | Freq: Every day | ORAL | Status: DC

## 2011-12-12 MED ORDER — FUROSEMIDE 20 MG PO TABS: ORAL_TABLET | ORAL | Status: DC

## 2011-12-12 MED ORDER — CARVEDILOL 12.5 MG PO TABS: 12.5000 mg | ORAL_TABLET | Freq: Two times a day (BID) | ORAL | Status: DC

## 2011-12-12 NOTE — Progress Notes (Signed)
HPI: [name] [name] is a talkative 50 y/o patient of Dr. Diona Browner for ongoing assessment and treatment of  ICM, EF of 30-35%, s/p St. Jude ICD pacemaker, severe 2 vessel CAD, CVA , and anticardiolipin antibody positive on coumadin. She comes today with complaints of chronic job stress, chest discomfort at night which awakens her, periods when "my blood sugar drops" with feelings of palpitations and clamminess. She is intolerant of metoprolol and statins.   Allergies  Allergen Reactions  . Penicillins     REACTION: shock  . Reglan     Current Outpatient Prescriptions  Medication Sig Dispense Refill  . aspirin 81 MG tablet Take 81 mg by mouth daily.        . carvedilol (COREG) 12.5 MG tablet Take 1 tablet (12.5 mg total) by mouth 2 (two) times daily with a meal.  180 tablet  3  . digoxin (LANOXIN) 0.25 MG tablet TAKE ONE TABLET BY MOUTH EVERY DAY  30 tablet  3  . famotidine (PEPCID) 20 MG tablet Take 20 mg by mouth 2 (two) times daily.        . furosemide (LASIX) 20 MG tablet ALTERNATE 1 TABLET DAILY WITH 1/2 TABLET EVERY OTHER DAY  180 tablet  3  . HYDROcodone-acetaminophen (NORCO) 10-325 MG per tablet Take 1 tablet by mouth every 6 (six) hours as needed. Take 1/2 tab bid        . ibuprofen (ADVIL,MOTRIN) 200 MG tablet Take 200 mg by mouth every 6 (six) hours as needed.        Marland Kitchen KLOR-CON M20 20 MEQ tablet TAKE ONE TABLET BY MOUTH EVERY DAY  90 each  1  . lisinopril (PRINIVIL,ZESTRIL) 2.5 MG tablet TAKE ONE TABLET BY MOUTH EVERY DAY  30 tablet  2  . nitroGLYCERIN (NITROSTAT) 0.4 MG SL tablet Place 0.4 mg under the tongue every 5 (five) minutes as needed.        . warfarin (COUMADIN) 10 MG tablet Take 10 mg by mouth daily.        . isosorbide mononitrate (IMDUR) 30 MG 24 hr tablet Take 1 tablet (30 mg total) by mouth daily.  90 tablet  3    Past Medical History  Diagnosis Date  . History of pneumonia   . Chronic systolic heart failure   . Stroke     Total occlusion of the right internal  carotid  . Coronary atherosclerosis of native coronary artery     Severe two vessel disease   . Cocaine abuse in remission   . Raynaud's phenomenon   . Essential hypertension, benign   . Hyperlipidemia   . Mixed hyperlipidemia     LVEF 30-35%  . Anticardiolipin antibody positive     Chronic Coumadin    Past Surgical History  Procedure Date  . Cardiac defibrillator placement     St.Jude ICD  . Appendectomy   . Laryngeal polyp excision   . L1 corpectomy  12/10    ZOX:[id number] of systems complete and found to be negative unless listed above  PHYSICAL EXAM BP 131/75  Pulse 83  Wt 213 lb (96.616 kg)  General: Well developed, well nourished, in no acute distress Head: Eyes PERRLA, No xanthomas.   Normal cephalic and atramatic  Lungs: Clear bilaterally to auscultation and percussion. Heart: HRRR S1 S2,distant heart sounds.  Pulses are 2+ & equal.            No carotid bruit. No JVD.  No abdominal bruits. No femoral bruits.  Abdomen: Bowel sounds are positive, abdomen soft and non-tender without masses or                  Hernia's noted. Msk:  Back normal, normal gait. Normal strength and tone for age. Extremities: No clubbing, cyanosis or edema.  DP +1 Neuro: Alert and oriented X 3. Psych:  Good affect, responds appropriately  EKG:NSR with rate of 75 bpm. Septal infarct. Nonspecific T-wave flattening with ventricular conduction delay in the lateral leads.  ASSESSMENT AND PLAN

## 2011-12-12 NOTE — Assessment & Plan Note (Signed)
She is unable to tolerate statins as she has myalgia's. Will start Fish Oil 1000 mg daily. Follow-up labs in 6 months. Low cholesterol diet and avoiding fast foods is discussed with her.

## 2011-12-12 NOTE — Assessment & Plan Note (Signed)
She has a lot of somatic complaints, however of concern is her description of "my sugar dropping with clamminess" symptoms. She has ICD pacemaker that was last checked in April of 2012. She has been by me and by Dr. Diona Browner on this visit. She will be scheduled for ICD interrogation within the next couple of weeks to ascertain if she is having ventricular arrhythmias causing these symptoms. She has not felt the ICD discharge.   She will continue current medications but change her digoxin dose to the am.   She has been awakened by chest discomfort and had been on imdur in the past. She is uncertain why she stopped taking it.  I will begin low dose 15 mg at HS, as she states that she takes NTG at night when the pain comes on and it is relieved by this.  She will see Dr. Diona Browner again in 6 months unless symptomatic.

## 2011-12-12 NOTE — Assessment & Plan Note (Signed)
She will continue having INR checks in our office in [place] as scheduled.

## 2011-12-12 NOTE — Progress Notes (Signed)
Patient seen and examined. She is well known to me. Since last visit, she has gained at least 20 pounds, reports eating lots of fast food related to her job, also being under a lot of stress. She has been having some nocturnal chest pain and palpitations, also intermittent dizziness. She denies any device discharges.  On exam she is comfortable, systolic blood pressure 131, lungs are clear, regular rate and rhythm without S3, no peripheral edema.  ECG shows nonspecific ST-T changes overall.  Plan was reviewed with [name] [name]. We will add Imdur back to her current regimen. We discussed diet and weight loss, also stress reduction. Also plan to have her ICD interrogated to exclude any developing ventricular tachycardia that may be producing some of her symptoms.

## 2011-12-12 NOTE — Patient Instructions (Signed)
Your physician recommends that you schedule a follow-up appointment in: 12 months with Dr Diona Browner Pacemaker check first available  Your physician has recommended you make the following change in your medication:  Start Fish oil 1 gram every day Imdur 15 mg daily  Quit Smoking

## 2011-12-13 ENCOUNTER — Encounter: Payer: Self-pay | Admitting: Cardiology

## 2011-12-23 ENCOUNTER — Encounter (HOSPITAL_COMMUNITY): Payer: Self-pay | Admitting: *Deleted

## 2011-12-23 ENCOUNTER — Emergency Department (HOSPITAL_COMMUNITY)
Admission: EM | Admit: 2011-12-23 | Discharge: 2011-12-23 | Disposition: A | Discharge status: Home / Self Care | Payer: PRIVATE HEALTH INSURANCE | Attending: Emergency Medicine | Admitting: Emergency Medicine

## 2011-12-23 DIAGNOSIS — K5289 Other specified noninfective gastroenteritis and colitis: Secondary | ICD-10-CM | POA: Insufficient documentation

## 2011-12-23 DIAGNOSIS — R1110 Vomiting, unspecified: Secondary | ICD-10-CM | POA: Insufficient documentation

## 2011-12-23 DIAGNOSIS — Z79899 Other long term (current) drug therapy: Secondary | ICD-10-CM | POA: Insufficient documentation

## 2011-12-23 DIAGNOSIS — I5022 Chronic systolic (congestive) heart failure: Secondary | ICD-10-CM | POA: Insufficient documentation

## 2011-12-23 DIAGNOSIS — K529 Noninfective gastroenteritis and colitis, unspecified: Secondary | ICD-10-CM

## 2011-12-23 DIAGNOSIS — E785 Hyperlipidemia, unspecified: Secondary | ICD-10-CM | POA: Insufficient documentation

## 2011-12-23 DIAGNOSIS — F17200 Nicotine dependence, unspecified, uncomplicated: Secondary | ICD-10-CM | POA: Insufficient documentation

## 2011-12-23 LAB — URINE MICROSCOPIC-ADD ON

## 2011-12-23 LAB — CBC
HCT: 48.9 % — ABNORMAL HIGH (ref 36.0–46.0)
Hemoglobin: 16 g/dL — ABNORMAL HIGH (ref 12.0–15.0)
MCH: 30 pg (ref 26.0–34.0)
MCHC: 32.7 g/dL (ref 30.0–36.0)
MCV: 91.6 fL (ref 78.0–100.0)
Platelets: 159 10*3/uL (ref 150–400)
RBC: 5.34 MIL/uL — ABNORMAL HIGH (ref 3.87–5.11)
RDW: 13.5 % (ref 11.5–15.5)
WBC: 17.3 10*3/uL — ABNORMAL HIGH (ref 4.0–10.5)

## 2011-12-23 LAB — BASIC METABOLIC PANEL
BUN: 14 mg/dL (ref 6–23)
CO2: 28 mEq/L (ref 19–32)
Calcium: 9.7 mg/dL (ref 8.4–10.5)
Chloride: 95 mEq/L — ABNORMAL LOW (ref 96–112)
Creatinine, Ser: 0.67 mg/dL (ref 0.50–1.10)
GFR calc Af Amer: >90 mL/min (ref >90)
GFR calc non Af Amer: >90 mL/min (ref >90)
Glucose, Bld: 120 mg/dL — ABNORMAL HIGH (ref 70–99)
Potassium: 4 mEq/L (ref 3.5–5.1)
Sodium: 135 mEq/L (ref 135–145)

## 2011-12-23 LAB — DIFFERENTIAL
Basophils Absolute: 0 10*3/uL (ref 0.0–0.1)
Basophils Relative: 0 % (ref 0–1)
Eosinophils Absolute: 0 10*3/uL (ref 0.0–0.7)
Eosinophils Relative: 0 % (ref 0–5)
Lymphocytes Relative: 3 % — ABNORMAL LOW (ref 12–46)
Lymphs Abs: 0.5 10*3/uL — ABNORMAL LOW (ref 0.7–4.0)
Monocytes Absolute: 0.4 10*3/uL (ref 0.1–1.0)
Monocytes Relative: 2 % — ABNORMAL LOW (ref 3–12)
Neutro Abs: 16.4 10*3/uL — ABNORMAL HIGH (ref 1.7–7.7)
Neutrophils Relative %: 95 % — ABNORMAL HIGH (ref 43–77)

## 2011-12-23 LAB — URINALYSIS, ROUTINE W REFLEX MICROSCOPIC
Bilirubin Urine: NEGATIVE
Glucose, UA: NEGATIVE mg/dL
Ketones, ur: NEGATIVE mg/dL
Leukocytes, UA: NEGATIVE
Nitrite: NEGATIVE
Protein, ur: NEGATIVE mg/dL
Specific Gravity, Urine: >1.03 — ABNORMAL HIGH (ref 1.005–1.030)
Urobilinogen, UA: 0.2 mg/dL (ref 0.0–1.0)
pH: 5.5 (ref 5.0–8.0)

## 2011-12-23 MED ORDER — SODIUM CHLORIDE 0.9 % IV BOLUS (SEPSIS)
500.0000 mL | Freq: Once | INTRAVENOUS | Status: AC
  Administered 2011-12-23: 500 mL via INTRAVENOUS

## 2011-12-23 MED ORDER — LOPERAMIDE HCL 2 MG PO CAPS: 2.0000 mg | ORAL_CAPSULE | Freq: Four times a day (QID) | ORAL | Status: AC | PRN

## 2011-12-23 MED ORDER — ONDANSETRON HCL 4 MG/2ML IJ SOLN
4.0000 mg | Freq: Once | INTRAMUSCULAR | Status: AC
  Administered 2011-12-23: 4 mg via INTRAVENOUS
  Filled 2011-12-23: qty 2

## 2011-12-23 MED ORDER — ONDANSETRON 8 MG PO TBDP: 8.0000 mg | ORAL_TABLET | Freq: Three times a day (TID) | ORAL | Status: AC | PRN

## 2011-12-23 MED ORDER — SODIUM CHLORIDE 0.9 % IV SOLN: INTRAVENOUS | Status: DC

## 2011-12-23 MED ORDER — ONDANSETRON 4 MG PO TBDP: 4.0000 mg | ORAL_TABLET | Freq: Once | ORAL | Status: DC

## 2011-12-23 NOTE — ED Notes (Signed)
Pt reports has also had llq pain but has been evaluated.  Says thinks the vomiting has aggravated the LLQ pain and her back pain.

## 2011-12-23 NOTE — ED Provider Notes (Signed)
History   This chart was scribed for [name] Jakes, MD by Clarita Crane. The patient was seen in room APA17/[facility]17 and the patient's care was started at 1:57pm.   CSN: [id number]  Arrival date & time 12/23/11  1157   First MD Initiated Contact with Patient 12/23/11 1312      Chief Complaint  Patient presents with  . Emesis    (Consider location/radiation/quality/duration/timing/severity/associated sxs/prior treatment) HPI [name] [name] is a 50 y.o. female who presents to the Emergency Department complaining of moderate constant vomiting onset at 9.5 hours ago. Pt has vomited 5-6 times and has had dry heaves throughout today. Pt has associated diarrhea described as watery but without blood, a productive cough with phlegm, and a low grade fever. Denies hematemesis, hematochezia, abdominal pain and rash. Pt has pacemaker and defibrillator which she has had since November 2010. Pt with hx CHF, pneumonia.  PCP: Dr. [name] Heart: Dr. Lilian Kapur  Past Medical History  Diagnosis Date  . History of pneumonia   . Chronic systolic heart failure   . 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 Coronary atherosclerosis of native coronary artery     Severe two vessel disease   . Cocaine abuse in remission   . Raynaud's phenomenon   . Essential hypertension, benign   . Hyperlipidemia   . Mixed hyperlipidemia     LVEF 30-35%  . Anticardiolipin antibody positive     Chronic Coumadin    Past Surgical History  Procedure Date  . Cardiac defibrillator placement     St.Jude ICD  . Appendectomy   . Laryngeal polyp excision   . L1 corpectomy  12/10    History reviewed. No pertinent family history.  History  Substance Use Topics  . Smoking status: Current Everyday Smoker    Types: Cigarettes  . Smokeless tobacco: Never Used  . Alcohol Use: No     Review of Systems  Constitutional: Positive for fever (low grade).  HENT: Negative for rhinorrhea.   Eyes: Negative  for pain.  Respiratory: Positive for cough (with phlegm). Negative for shortness of breath.   Cardiovascular: Negative for chest pain.  Gastrointestinal: Positive for vomiting and diarrhea. Negative for nausea and abdominal pain.  Genitourinary: Negative for dysuria.  Musculoskeletal: Negative for back pain.  Skin: Negative for rash.  Neurological: Negative for weakness and headaches.  All other systems reviewed and are negative.    Allergies  Metoprolol; Penicillins; Reglan; and Statins  Home Medications   Current Outpatient Rx  Name Route Sig Dispense Refill  . ASPIRIN 81 MG PO TABS Oral Take 81 mg by mouth daily.      Marland Kitchen CARVEDILOL 12.5 MG PO TABS Oral Take 1 tablet (12.5 mg total) by mouth 2 (two) times daily with a meal. 180 tablet 3  . CITALOPRAM HYDROBROMIDE 20 MG PO TABS Oral Take 20 mg by mouth daily.      Marland Kitchen DIGOXIN 0.25 MG PO TABS  TAKE ONE TABLET BY MOUTH EVERY DAY 30 tablet 3  . FAMOTIDINE 20 MG PO TABS Oral Take 20 mg by mouth 2 (two) times daily.      . OMEGA-3 FATTY ACIDS 1000 MG PO CAPS Oral Take 1 g by mouth daily.      . FUROSEMIDE 20 MG PO TABS  ALTERNATE 1 TABLET DAILY WITH 1/2 TABLET EVERY OTHER DAY 180 tablet 3  . HYDROCODONE-ACETAMINOPHEN 10-325 MG PO TABS Oral Take 1 tablet by mouth every 6 (  six) hours as needed. Take 1/2 tab bid      . IBUPROFEN 200 MG PO TABS Oral Take 200 mg by mouth every 6 (six) hours as needed.      . ISOSORBIDE MONONITRATE ER 30 MG PO TB24 Oral Take 30 mg by mouth daily. 1/2 tablet      . KLOR-CON M20 20 MEQ PO TBCR  TAKE ONE TABLET BY MOUTH EVERY DAY 90 each 1  . LISINOPRIL 2.5 MG PO TABS  TAKE ONE TABLET BY MOUTH EVERY DAY 30 tablet 2  . THERA M PLUS PO TABS Oral Take 1 tablet by mouth daily.      . WARFARIN SODIUM 10 MG PO TABS Oral Take 10 mg by mouth daily.      Marland Kitchen LOPERAMIDE HCL 2 MG PO CAPS Oral Take 1 capsule (2 mg total) by mouth 4 (four) times daily as needed for diarrhea or loose stools. 12 capsule 0  . NITROGLYCERIN 0.4 MG  SL SUBL Sublingual Place 0.4 mg under the tongue every 5 (five) minutes as needed.      Marland Kitchen ONDANSETRON 8 MG PO TBDP Oral Take 1 tablet (8 mg total) by mouth every 8 (eight) hours as needed for nausea. 10 tablet 0    BP 149/83  Pulse 128  Temp(Src) 99.4 F (37.4 C) (Oral)  Resp 20  Ht 5\' 8"  (1.727 m)  Wt 203 lb (92.08 kg)  BMI 30.87 kg/m2  SpO2 97%  Physical Exam  Nursing note and vitals reviewed. Constitutional: She is oriented to person, place, and time. She appears well-developed and well-nourished. No distress.  HENT:  Head: Normocephalic and atraumatic.  Right Ear: External ear normal.  Left Ear: External ear normal.  Mouth/Throat: Oropharynx is clear and moist.  Eyes: Conjunctivae and EOM are normal. Pupils are equal, round, and reactive to light.  Neck: Neck supple.  Cardiovascular: Normal rate, regular rhythm and normal heart sounds.   Pulmonary/Chest: Effort normal and breath sounds normal.  Abdominal: Soft. Bowel sounds are normal. There is no tenderness.  Neurological: She is alert and oriented to person, place, and time. No cranial nerve deficit. She exhibits normal muscle tone. Coordination normal.  Skin: Skin is dry.  Psychiatric: She has a normal mood and affect. Her behavior is normal.    ED Course  Procedures (including critical care time) DIAGNOSTIC STUDIES: Oxygen Saturation is 97% on RA, nml by my interpretation.    COORDINATION OF CARE:    Labs Reviewed  CBC - Abnormal; Notable for the following:    WBC 17.3 (*)    RBC 5.34 (*)    Hemoglobin 16.0 (*)    HCT 48.9 (*)    All other components within normal limits  DIFFERENTIAL - Abnormal; Notable for the following:    Neutrophils Relative 95 (*)    Neutro Abs 16.4 (*)    Lymphocytes Relative 3 (*)    Lymphs Abs 0.5 (*)    Monocytes Relative 2 (*)    All other components within normal limits  BASIC METABOLIC PANEL - Abnormal; Notable for the following:    Chloride 95 (*)    Glucose, Bld 120 (*)     All other components within normal limits  URINALYSIS, ROUTINE W REFLEX MICROSCOPIC - Abnormal; Notable for the following:    Specific Gravity, Urine >1.030 (*)    Hgb urine dipstick SMALL (*)    All other components within normal limits  URINE MICROSCOPIC-ADD ON - Abnormal; Notable for the following:  Squamous Epithelial / LPF FEW (*)    All other components within normal limits   Results for orders placed during the hospital encounter of 12/23/11  CBC      Component Value Range   WBC 17.3 (*) 4.0 - 10.5 (K/uL)   RBC 5.34 (*) 3.87 - 5.11 (MIL/uL)   Hemoglobin 16.0 (*) 12.0 - 15.0 (g/dL)   HCT 56.2 (*) 13.0 - 46.0 (%)   MCV 91.6  78.0 - 100.0 (fL)   MCH 30.0  26.0 - 34.0 (pg)   MCHC 32.7  30.0 - 36.0 (g/dL)   RDW 86.5  78.4 - 69.6 (%)   Platelets 159  150 - 400 (K/uL)  DIFFERENTIAL      Component Value Range   Neutrophils Relative 95 (*) 43 - 77 (%)   Neutro Abs 16.4 (*) 1.7 - 7.7 (K/uL)   Lymphocytes Relative 3 (*) 12 - 46 (%)   Lymphs Abs 0.5 (*) 0.7 - 4.0 (K/uL)   Monocytes Relative 2 (*) 3 - 12 (%)   Monocytes Absolute 0.4  0.1 - 1.0 (K/uL)   Eosinophils Relative 0  0 - 5 (%)   Eosinophils Absolute 0.0  0.0 - 0.7 (K/uL)   Basophils Relative 0  0 - 1 (%)   Basophils Absolute 0.0  0.0 - 0.1 (K/uL)  BASIC METABOLIC PANEL      Component Value Range   Sodium 135  135 - 145 (mEq/L)   Potassium 4.0  3.5 - 5.1 (mEq/L)   Chloride 95 (*) 96 - 112 (mEq/L)   CO2 28  19 - 32 (mEq/L)   Glucose, Bld 120 (*) 70 - 99 (mg/dL)   BUN 14  6 - 23 (mg/dL)   Creatinine, Ser 2.95  0.50 - 1.10 (mg/dL)   Calcium 9.7  8.4 - 28.4 (mg/dL)   GFR calc non Af Amer >90  >90 (mL/min)   GFR calc Af Amer >90  >90 (mL/min)  URINALYSIS, ROUTINE W REFLEX MICROSCOPIC      Component Value Range   Color, Urine YELLOW  YELLOW    APPearance CLEAR  CLEAR    Specific Gravity, Urine >1.030 (*) 1.005 - 1.030    pH 5.5  5.0 - 8.0    Glucose, UA NEGATIVE  NEGATIVE (mg/dL)   Hgb urine dipstick SMALL (*)  NEGATIVE    Bilirubin Urine NEGATIVE  NEGATIVE    Ketones, ur NEGATIVE  NEGATIVE (mg/dL)   Protein, ur NEGATIVE  NEGATIVE (mg/dL)   Urobilinogen, UA 0.2  0.0 - 1.0 (mg/dL)   Nitrite NEGATIVE  NEGATIVE    Leukocytes, UA NEGATIVE  NEGATIVE   URINE MICROSCOPIC-ADD ON      Component Value Range   Squamous Epithelial / LPF FEW (*) RARE    WBC, UA 0-2  <3 (WBC/hpf)   RBC / HPF 3-6  <3 (RBC/hpf)   Bacteria, UA RARE  RARE      1. Gastroenteritis       MDM   Clinical patient's symptoms consistent with a gastroenteritis doubt food poisoning. Doubt influenza. Patient with history of significant cardiac disease congestive heart failure with pacemaker and implanted defibrillator. Patient's defibrillator has not fired. No chest pain no shortness of breath, just frequent vomiting and diarrhea. No blood in either.  Electrolytes without any sniffing abnormalities blood cell count is elevated with a leukocytosis 17,000 consistent with acute gastroenteritis. Patient improved with antinausea medicine and fluids. We'll send home with Imodium and Zofran.      I  personally performed the services described in this documentation, which was scribed in my presence. The recorded information has been reviewed and considered.     [name] Jakes, MD 12/23/11 [phone]

## 2011-12-23 NOTE — ED Notes (Addendum)
Pt c/o vomiting and diarrhea since 4 am. Pt also c/o back pain.

## 2011-12-26 ENCOUNTER — Other Ambulatory Visit: Payer: PRIVATE HEALTH INSURANCE

## 2012-01-02 ENCOUNTER — Other Ambulatory Visit: Payer: PRIVATE HEALTH INSURANCE

## 2012-01-07 ENCOUNTER — Other Ambulatory Visit: Payer: Self-pay | Admitting: *Deleted

## 2012-01-07 MED ORDER — LISINOPRIL 2.5 MG PO TABS: 2.5000 mg | ORAL_TABLET | Freq: Every day | ORAL | Status: DC

## 2012-01-09 ENCOUNTER — Ambulatory Visit (INDEPENDENT_AMBULATORY_CARE_PROVIDER_SITE_OTHER): Payer: PRIVATE HEALTH INSURANCE | Admitting: *Deleted

## 2012-01-09 DIAGNOSIS — I679 Cerebrovascular disease, unspecified: Secondary | ICD-10-CM

## 2012-01-09 DIAGNOSIS — Z7901 Long term (current) use of anticoagulants: Secondary | ICD-10-CM

## 2012-01-09 DIAGNOSIS — R894 Abnormal immunological findings in specimens from other organs, systems and tissues: Secondary | ICD-10-CM

## 2012-01-09 LAB — POCT INR: INR: 2.1

## 2012-02-14 ENCOUNTER — Encounter: Payer: PRIVATE HEALTH INSURANCE | Admitting: *Deleted

## 2012-02-14 ENCOUNTER — Ambulatory Visit (INDEPENDENT_AMBULATORY_CARE_PROVIDER_SITE_OTHER): Payer: PRIVATE HEALTH INSURANCE | Admitting: *Deleted

## 2012-02-14 ENCOUNTER — Encounter: Payer: Self-pay | Admitting: Internal Medicine

## 2012-02-14 DIAGNOSIS — I2589 Other forms of chronic ischemic heart disease: Secondary | ICD-10-CM

## 2012-02-14 LAB — ICD DEVICE OBSERVATION
AL AMPLITUDE: 2.4 mv
AL IMPEDENCE ICD: 547 Ohm
AL THRESHOLD: 0.2 V
ATRIAL PACING ICD: 1 pct
BATTERY VOLTAGE: 2.95 V
DEV-0020ICD: NEGATIVE
DEVICE MODEL ICD: 504498
HV IMPEDENCE: 50 Ohm
RV LEAD AMPLITUDE: 21.6 mv
RV LEAD IMPEDENCE ICD: 517 Ohm
RV LEAD THRESHOLD: 1 V
TZON-0003FASTVT: 352.9 ms
VENTRICULAR PACING ICD: 0 pct

## 2012-02-14 NOTE — Progress Notes (Signed)
ICD check 

## 2012-02-17 ENCOUNTER — Ambulatory Visit: Payer: Self-pay | Admitting: *Deleted

## 2012-02-17 DIAGNOSIS — R894 Abnormal immunological findings in specimens from other organs, systems and tissues: Secondary | ICD-10-CM

## 2012-02-17 DIAGNOSIS — I679 Cerebrovascular disease, unspecified: Secondary | ICD-10-CM

## 2012-02-17 DIAGNOSIS — Z7901 Long term (current) use of anticoagulants: Secondary | ICD-10-CM

## 2012-02-17 LAB — POCT INR: INR: 1.5

## 2012-04-11 ENCOUNTER — Other Ambulatory Visit: Payer: Self-pay | Admitting: Cardiology

## 2012-04-19 ENCOUNTER — Other Ambulatory Visit: Payer: Self-pay | Admitting: Cardiology

## 2012-05-13 ENCOUNTER — Other Ambulatory Visit: Payer: Self-pay | Admitting: Cardiology

## 2012-06-01 ENCOUNTER — Ambulatory Visit (INDEPENDENT_AMBULATORY_CARE_PROVIDER_SITE_OTHER): Payer: PRIVATE HEALTH INSURANCE | Admitting: *Deleted

## 2012-06-01 ENCOUNTER — Ambulatory Visit (INDEPENDENT_AMBULATORY_CARE_PROVIDER_SITE_OTHER): Payer: PRIVATE HEALTH INSURANCE | Admitting: Internal Medicine

## 2012-06-01 ENCOUNTER — Encounter: Payer: Self-pay | Admitting: Internal Medicine

## 2012-06-01 VITALS — BP 111/65 | HR 65 | Resp 16 | Ht 68.0 in | Wt 211.0 lb

## 2012-06-01 DIAGNOSIS — I10 Essential (primary) hypertension: Secondary | ICD-10-CM

## 2012-06-01 DIAGNOSIS — Z95810 Presence of automatic (implantable) cardiac defibrillator: Secondary | ICD-10-CM

## 2012-06-01 DIAGNOSIS — I2589 Other forms of chronic ischemic heart disease: Secondary | ICD-10-CM

## 2012-06-01 DIAGNOSIS — R894 Abnormal immunological findings in specimens from other organs, systems and tissues: Secondary | ICD-10-CM

## 2012-06-01 DIAGNOSIS — Z7901 Long term (current) use of anticoagulants: Secondary | ICD-10-CM

## 2012-06-01 DIAGNOSIS — I679 Cerebrovascular disease, unspecified: Secondary | ICD-10-CM

## 2012-06-01 LAB — ICD DEVICE OBSERVATION
AL AMPLITUDE: 1.8 mv
AL IMPEDENCE ICD: 541 Ohm
AL THRESHOLD: 0.6 V
ATRIAL PACING ICD: 0 pct
BATTERY VOLTAGE: 2.95 V
DEV-0020ICD: NEGATIVE
DEVICE MODEL ICD: 504498
HV IMPEDENCE: 46 Ohm
RV LEAD AMPLITUDE: 23.5 mv
RV LEAD IMPEDENCE ICD: 511 Ohm
RV LEAD THRESHOLD: 1 V
TZON-0003FASTVT: 352.9 ms
VENTRICULAR PACING ICD: 0 pct

## 2012-06-01 LAB — POCT INR: INR: 2.7

## 2012-06-01 MED ORDER — ISOSORBIDE MONONITRATE ER 30 MG PO TB24: 30.0000 mg | ORAL_TABLET | Freq: Every day | ORAL | Status: DC

## 2012-06-01 NOTE — Assessment & Plan Note (Signed)
Her device is working normally. We'll plan to recheck in several months. 

## 2012-06-01 NOTE — Patient Instructions (Signed)
**Note De-Identified Kanoelani Dobies Obfuscation** Your physician has recommended you make the following change in your medication: start taking Isosorbide 30 mg daily  Your physician recommends that you schedule a follow-up appointment in: 1 year

## 2012-06-01 NOTE — Progress Notes (Signed)
HPI [name] returns today for followup. She is a very pleasant 51 year old woman with an ischemic cardiomyopathy, chronic systolic heart failure, well compensated, status post ICD implantation. The patient has done well in the interim. She is gone back to work. She denies chest pain or shortness of breath except during periods of intense stress. She has had no ICD shocks. She denies peripheral edema. Allergies  Allergen Reactions  . Metoclopramide Hcl   . Metoprolol     Syncope   . Penicillins     REACTION: shock  . Statins     Myalgias     Current Outpatient Prescriptions  Medication Sig Dispense Refill  . aspirin 81 MG tablet Take 81 mg by mouth daily.        . carvedilol (COREG) 12.5 MG tablet Take 1 tablet (12.5 mg total) by mouth 2 (two) times daily with a meal.  180 tablet  3  . citalopram (CELEXA) 20 MG tablet Take 40 mg by mouth daily.       . cyclobenzaprine (FLEXERIL) 10 MG tablet Take 10 mg by mouth 3 (three) times daily as needed.      . digoxin (LANOXIN) 0.25 MG tablet TAKE ONE TABLET BY MOUTH EVERY DAY  30 tablet  4  . famotidine (PEPCID) 20 MG tablet Take 20 mg by mouth 2 (two) times daily.        . fish oil-omega-3 fatty acids 1000 MG capsule Take 1 g by mouth daily.        . furosemide (LASIX) 20 MG tablet ALTERNATE 1 TABLET DAILY WITH 1/2 TABLET EVERY OTHER DAY  180 tablet  3  . HYDROcodone-acetaminophen (NORCO) 10-325 MG per tablet Take 1 tablet by mouth every 6 (six) hours as needed. Take 1/2 tab bid        . ibuprofen (ADVIL,MOTRIN) 200 MG tablet Take 200 mg by mouth every 6 (six) hours as needed.        Marland Kitchen KLOR-CON M20 20 MEQ tablet TAKE ONE TABLET BY MOUTH EVERY DAY  90 each  2  . lisinopril (PRINIVIL,ZESTRIL) 2.5 MG tablet Take 1 tablet (2.5 mg total) by mouth daily.  90 tablet  3  . Multiple Vitamins-Minerals (MULTIVITAMINS THER. W/MINERALS) TABS Take 1 tablet by mouth daily.        Marland Kitchen NITROSTAT 0.4 MG SL tablet DISSOLVE ONE TABLET UNDER THE TONGUE EVERY 5  MINUTES AS NEEDED FOR CHEST PAIN.  DO NOT EXCEED A TOTAL OF 3 DOSES IN 15 MINUTES  25 each  6  . ondansetron (ZOFRAN) 4 MG tablet Take 4 mg by mouth every 8 (eight) hours as needed.      . warfarin (COUMADIN) 10 MG tablet Take 10 mg by mouth daily.        . isosorbide mononitrate (IMDUR) 30 MG 24 hr tablet Take 1 tablet (30 mg total) by mouth daily.  90 tablet  3     Past Medical History  Diagnosis Date  . History of pneumonia   . Chronic systolic heart failure   . 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 Coronary atherosclerosis of native coronary artery     Severe two vessel disease   . Cocaine abuse in remission   . Raynaud's phenomenon   . Essential hypertension, benign   . Hyperlipidemia   . Mixed hyperlipidemia     LVEF 30-35%  . Anticardiolipin antibody positive     Chronic Coumadin    ROS:  All systems reviewed and negative except as noted in the HPI.   Past Surgical History  Procedure Date  . Cardiac defibrillator placement     St.Jude ICD  . Appendectomy   . Laryngeal polyp excision   . L1 corpectomy  12/10     No family history on file.   History   Social History  . Marital Status: Single    Spouse Name: N/A    Number of Children: N/A  . Years of Education: N/A   Occupational History  . 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 Smoking status: Current Everyday Smoker    Types: Cigarettes  . Smokeless tobacco: Never Used  . Alcohol Use: No  . Drug Use: No     Prior history of cocaine  . Sexually Active: Not on file   Other Topics Concern  . Not on file   Social History Narrative  . No narrative on file     BP 111/65  Pulse 65  Resp 16  Ht 5\' 8"  (1.727 m)  Wt 211 lb (95.709 kg)  BMI 32.08 kg/m2  Physical Exam:  Well appearing middle-aged woman, NAD HEENT: Unremarkable Neck:  No JVD, no thyromegally Lungs:  Clear with no wheezes, rales, or rhonchi. Well-healed ICD incision. HEART:  Regular  rate rhythm, no murmurs, no rubs, no clicks Abd:  soft, positive bowel sounds, no organomegally, no rebound, no guarding Ext:  2 plus pulses, no edema, no cyanosis, no clubbing Skin:  No rashes no nodules Neuro:  CN II through XII intact, motor grossly intact  DEVICE  Normal device function.  See PaceArt for details.   Assess/Plan:

## 2012-06-01 NOTE — Assessment & Plan Note (Signed)
Her blood pressure is well controlled today. She will continue her current medical therapy and maintain a low-sodium diet. 

## 2012-06-01 NOTE — Assessment & Plan Note (Signed)
She notes occasional episodes of chest discomfort when she is experiencing emotional stress but not with exertion physically. She will continue her current medical therapy. She's been given a prescription for isosorbide which she takes chronically.

## 2012-07-27 ENCOUNTER — Ambulatory Visit (INDEPENDENT_AMBULATORY_CARE_PROVIDER_SITE_OTHER): Payer: PRIVATE HEALTH INSURANCE | Admitting: *Deleted

## 2012-07-27 DIAGNOSIS — I679 Cerebrovascular disease, unspecified: Secondary | ICD-10-CM

## 2012-07-27 DIAGNOSIS — R894 Abnormal immunological findings in specimens from other organs, systems and tissues: Secondary | ICD-10-CM

## 2012-07-27 DIAGNOSIS — Z7901 Long term (current) use of anticoagulants: Secondary | ICD-10-CM

## 2012-07-27 LAB — POCT INR: INR: 1.2

## 2012-08-05 ENCOUNTER — Ambulatory Visit (INDEPENDENT_AMBULATORY_CARE_PROVIDER_SITE_OTHER): Payer: PRIVATE HEALTH INSURANCE | Admitting: *Deleted

## 2012-08-05 DIAGNOSIS — R894 Abnormal immunological findings in specimens from other organs, systems and tissues: Secondary | ICD-10-CM

## 2012-08-05 DIAGNOSIS — I679 Cerebrovascular disease, unspecified: Secondary | ICD-10-CM

## 2012-08-05 DIAGNOSIS — Z7901 Long term (current) use of anticoagulants: Secondary | ICD-10-CM

## 2012-08-05 LAB — POCT INR: INR: 2.7

## 2012-09-03 ENCOUNTER — Encounter: Payer: PRIVATE HEALTH INSURANCE | Admitting: *Deleted

## 2012-09-16 ENCOUNTER — Encounter: Payer: Self-pay | Admitting: *Deleted

## 2012-09-22 ENCOUNTER — Other Ambulatory Visit: Payer: Self-pay | Admitting: Cardiology

## 2012-10-12 ENCOUNTER — Ambulatory Visit (INDEPENDENT_AMBULATORY_CARE_PROVIDER_SITE_OTHER): Payer: Medicaid Other | Admitting: Cardiology

## 2012-10-12 ENCOUNTER — Ambulatory Visit (INDEPENDENT_AMBULATORY_CARE_PROVIDER_SITE_OTHER): Payer: PRIVATE HEALTH INSURANCE | Admitting: *Deleted

## 2012-10-12 ENCOUNTER — Encounter: Payer: Self-pay | Admitting: Cardiology

## 2012-10-12 VITALS — BP 100/68 | HR 72 | Wt 215.0 lb

## 2012-10-12 DIAGNOSIS — Z7901 Long term (current) use of anticoagulants: Secondary | ICD-10-CM

## 2012-10-12 DIAGNOSIS — I679 Cerebrovascular disease, unspecified: Secondary | ICD-10-CM

## 2012-10-12 DIAGNOSIS — R894 Abnormal immunological findings in specimens from other organs, systems and tissues: Secondary | ICD-10-CM

## 2012-10-12 DIAGNOSIS — I10 Essential (primary) hypertension: Secondary | ICD-10-CM

## 2012-10-12 DIAGNOSIS — I2589 Other forms of chronic ischemic heart disease: Secondary | ICD-10-CM

## 2012-10-12 LAB — POCT INR: INR: 2.4

## 2012-10-12 NOTE — Patient Instructions (Signed)
Your physician recommends that you schedule a follow-up appointment in: 6 months  Stay off of lisinopril

## 2012-10-12 NOTE — Progress Notes (Signed)
Clinical Summary [name] [name] is a 51 y.o.female presenting for followup. I saw her back in December with [name] [name]. She had a more recent visit with Dr. Ladona Ridgel in June and had normal device function at that time. Had one NSVT. No therapies.  She reports no regular angina, stable NYHA class II dyspnea. Reports no device discharges or prolonged palpitations.  I reviewed her medications. She states that she had to stop low-dose lisinopril related to intermittent dizziness and hypotension.  She continues to work full time at [employer]. Reports long hours, has not been exercising regularly. We did address this today.   Allergies  Allergen Reactions  . Metoclopramide Hcl   . Metoprolol     Syncope   . Penicillins     REACTION: shock  . Statins     Myalgias    Current Outpatient Prescriptions  Medication Sig Dispense Refill  . aspirin 81 MG tablet Take 81 mg by mouth daily.        . carvedilol (COREG) 12.5 MG tablet Take 1 tablet (12.5 mg total) by mouth 2 (two) times daily with a meal.  180 tablet  3  . citalopram (CELEXA) 20 MG tablet Take 40 mg by mouth daily.       . cyclobenzaprine (FLEXERIL) 10 MG tablet Take 10 mg by mouth 3 (three) times daily as needed.      . digoxin (LANOXIN) 0.25 MG tablet TAKE ONE TABLET BY MOUTH EVERY DAY  30 tablet  3  . famotidine (PEPCID) 20 MG tablet Take 20 mg by mouth 2 (two) times daily.        . fish oil-omega-3 fatty acids 1000 MG capsule Take 1 g by mouth daily.        . furosemide (LASIX) 20 MG tablet ALTERNATE 1 TABLET DAILY WITH 1/2 TABLET EVERY OTHER DAY  180 tablet  3  . HYDROcodone-acetaminophen (NORCO) 10-325 MG per tablet Take 1 tablet by mouth every 6 (six) hours as needed. Take 1/2 tab bid        . ibuprofen (ADVIL,MOTRIN) 200 MG tablet Take 200 mg by mouth every 6 (six) hours as needed.        . isosorbide mononitrate (IMDUR) 30 MG 24 hr tablet Take 1 tablet (30 mg total) by mouth daily.  90 tablet  3  . KLOR-CON M20 20 MEQ tablet  TAKE ONE TABLET BY MOUTH EVERY DAY  90 each  2  . Multiple Vitamins-Minerals (MULTIVITAMINS THER. W/MINERALS) TABS Take 1 tablet by mouth daily.        Marland Kitchen NITROSTAT 0.4 MG SL tablet DISSOLVE ONE TABLET UNDER THE TONGUE EVERY 5 MINUTES AS NEEDED FOR CHEST PAIN.  DO NOT EXCEED A TOTAL OF 3 DOSES IN 15 MINUTES  25 each  6  . ondansetron (ZOFRAN) 4 MG tablet Take 4 mg by mouth every 8 (eight) hours as needed.      . warfarin (COUMADIN) 10 MG tablet Take 10 mg by mouth daily.          Past Medical History  Diagnosis Date  . History of pneumonia   . Chronic systolic heart failure   . 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 Coronary atherosclerosis of native coronary artery     Severe two vessel disease   . Cocaine abuse in remission   . Raynaud's phenomenon   . Essential hypertension, benign   . Hyperlipidemia   . Mixed hyperlipidemia  LVEF 30-35%  . Anticardiolipin antibody positive     Chronic Coumadin    Social History [name] [name] reports that she has been smoking Cigarettes.  She has never used smokeless tobacco. [name] [name] reports that she does not drink alcohol.  Review of Systems Negative except as outlined.  Physical Examination Filed Vitals:   10/12/12 1455  BP: 100/68  Pulse: 72   Filed Weights   10/12/12 1454  Weight: 215 lb (97.523 kg)    HEENT: Conjunctiva and lids are normal, oropharynx clear.  Neck: Supple no loud carotid bruits, very soft bruit on the right.  Lungs: Clear to auscultation without labored breathing.  Cardiac: Regular rate and rhythm, no S3 gallop. PMI indistinct. No loud systolic murmur.  Abdomen: Soft nontender, no hepatomegalyl. Bowel sounds present.  Extremities: Diminished dorsalis pedis pulses bilaterally. Mild pedal edema.  Musculoskeletal: No kyphosis noted.  Skin: Warm and dry.  Neuropsychiatric: Patient alert and oriented x3, affect is grossly normal.    Problem List and Plan   CARDIOMYOPATHY,  ISCHEMIC Symptomatically stable on present medical regimen. Recommended regular walking for exercise, also diet. Continue observation for now.  ESSENTIAL HYPERTENSION, BENIGN Plan to keep her off lisinopril with intermittent hypotension. Otherwise no changes made.  ANTICARDIOLIPIN ANTIBODY SYNDROME She continues on Coumadin with followup through the Coumadin clinic. No reported bleeding episodes.  ICD (implantable cardiac defibrillator), single, in situ Keep followup with Dr. Ladona Ridgel. No device discharges.    [name], M.D., F.A.C.C.

## 2012-10-12 NOTE — Assessment & Plan Note (Signed)
Keep followup with Dr. Ladona Ridgel. No device discharges.

## 2012-10-12 NOTE — Assessment & Plan Note (Signed)
She continues on Coumadin with followup through the Coumadin clinic. No reported bleeding episodes.

## 2012-10-12 NOTE — Assessment & Plan Note (Signed)
Plan to keep her off lisinopril with intermittent hypotension. Otherwise no changes made.

## 2012-10-12 NOTE — Assessment & Plan Note (Signed)
Symptomatically stable on present medical regimen. Recommended regular walking for exercise, also diet. Continue observation for now.

## 2012-12-29 ENCOUNTER — Ambulatory Visit (HOSPITAL_COMMUNITY)
Admission: RE | Admit: 2012-12-29 | Discharge: 2012-12-29 | Disposition: A | Discharge status: Home / Self Care | Payer: PRIVATE HEALTH INSURANCE | Source: Ambulatory Visit | Attending: Pulmonary Disease | Admitting: Pulmonary Disease

## 2012-12-29 ENCOUNTER — Other Ambulatory Visit (HOSPITAL_COMMUNITY): Payer: Self-pay | Admitting: Pulmonary Disease

## 2012-12-29 ENCOUNTER — Encounter: Payer: Self-pay | Admitting: *Deleted

## 2012-12-29 DIAGNOSIS — M545 Low back pain, unspecified: Secondary | ICD-10-CM

## 2013-01-04 ENCOUNTER — Telehealth: Payer: Self-pay | Admitting: Internal Medicine

## 2013-01-04 NOTE — Telephone Encounter (Signed)
PT CALLING RE LETTER FROM [facility] RE PAST DUE CHECK, TOLD SHE WAS DUE FOR REMOTE 01-07-13, DOES SHE CALL IN OR DOES IT DO IT AUTOMATICALLY? SHE ALSO SENT ONE YESTERDAY TO MAKE SURE IT WAS WORKING, DID WE GET THAT ONE, IF SO, WHEN SHOULD SHE SEND ANOTHER ONE?

## 2013-01-04 NOTE — Telephone Encounter (Signed)
Spoke w/pt and transmission not received from yesterday. Pt scheduled for transmission on 01-07-13. Will wait on those results.

## 2013-01-07 ENCOUNTER — Ambulatory Visit (INDEPENDENT_AMBULATORY_CARE_PROVIDER_SITE_OTHER): Payer: Medicaid Other | Admitting: *Deleted

## 2013-01-07 ENCOUNTER — Ambulatory Visit (INDEPENDENT_AMBULATORY_CARE_PROVIDER_SITE_OTHER): Payer: PRIVATE HEALTH INSURANCE | Admitting: *Deleted

## 2013-01-07 DIAGNOSIS — I2589 Other forms of chronic ischemic heart disease: Secondary | ICD-10-CM

## 2013-01-07 DIAGNOSIS — I679 Cerebrovascular disease, unspecified: Secondary | ICD-10-CM

## 2013-01-07 DIAGNOSIS — Z95810 Presence of automatic (implantable) cardiac defibrillator: Secondary | ICD-10-CM

## 2013-01-07 DIAGNOSIS — Z7901 Long term (current) use of anticoagulants: Secondary | ICD-10-CM

## 2013-01-07 DIAGNOSIS — R894 Abnormal immunological findings in specimens from other organs, systems and tissues: Secondary | ICD-10-CM

## 2013-01-07 LAB — POCT INR: INR: 2

## 2013-01-11 LAB — REMOTE ICD DEVICE
AL AMPLITUDE: 1.3 mv
AL IMPEDENCE ICD: 535 Ohm
ATRIAL PACING ICD: 0 pct
BATTERY VOLTAGE: 2.94 V
CHARGE TIME: 9.5 s
DEV-0020ICD: NEGATIVE
DEVICE MODEL ICD: 504498
FVT: 1
HV IMPEDENCE: 9.5 Ohm
MODE SWITCH EPISODES: 0
PACEART VT: 0
RV LEAD AMPLITUDE: 21.6 mv
RV LEAD IMPEDENCE ICD: 511 Ohm
TOT-0006: 20131205000000
TZAT-0001FASTVT: 1
TZAT-0001FASTVT: 2
TZAT-0002FASTVT: NEGATIVE
TZAT-0013FASTVT: 4
TZAT-0018FASTVT: NEGATIVE
TZAT-0018FASTVT: NEGATIVE
TZON-0003FASTVT: 352.9 ms
TZST-0001FASTVT: 3
TZST-0001FASTVT: 4
TZST-0001FASTVT: 5
TZST-0001FASTVT: 6
TZST-0001FASTVT: 7
TZST-0003FASTVT: 31 J
TZST-0003FASTVT: 41 J
TZST-0003FASTVT: 41 J
TZST-0003FASTVT: 41 J
TZST-0003FASTVT: 6 J
VENTRICULAR PACING ICD: 0 pct
VF: 0

## 2013-01-13 ENCOUNTER — Encounter: Payer: Self-pay | Admitting: *Deleted

## 2013-01-14 ENCOUNTER — Ambulatory Visit: Payer: PRIVATE HEALTH INSURANCE | Admitting: Adult Health

## 2013-01-20 ENCOUNTER — Encounter: Payer: Self-pay | Admitting: Internal Medicine

## 2013-01-21 ENCOUNTER — Ambulatory Visit: Payer: PRIVATE HEALTH INSURANCE | Admitting: Adult Health

## 2013-01-25 ENCOUNTER — Encounter: Payer: Self-pay | Admitting: *Deleted

## 2013-01-25 ENCOUNTER — Encounter: Payer: Self-pay | Admitting: Adult Health

## 2013-01-25 ENCOUNTER — Ambulatory Visit (INDEPENDENT_AMBULATORY_CARE_PROVIDER_SITE_OTHER): Payer: Medicaid Other | Admitting: Adult Health

## 2013-01-25 VITALS — BP 120/77 | HR 94 | Ht 66.5 in | Wt 213.1 lb

## 2013-01-25 DIAGNOSIS — I779 Disorder of arteries and arterioles, unspecified: Secondary | ICD-10-CM

## 2013-01-25 DIAGNOSIS — Z95810 Presence of automatic (implantable) cardiac defibrillator: Secondary | ICD-10-CM

## 2013-01-25 DIAGNOSIS — I679 Cerebrovascular disease, unspecified: Secondary | ICD-10-CM

## 2013-01-25 DIAGNOSIS — I2589 Other forms of chronic ischemic heart disease: Secondary | ICD-10-CM

## 2013-01-25 DIAGNOSIS — I10 Essential (primary) hypertension: Secondary | ICD-10-CM

## 2013-01-25 DIAGNOSIS — R894 Abnormal immunological findings in specimens from other organs, systems and tissues: Secondary | ICD-10-CM

## 2013-01-25 NOTE — Assessment & Plan Note (Signed)
She follows in our coumadin clinic every 6 weeks. Continue this as directed.

## 2013-01-25 NOTE — Progress Notes (Signed)
HPI: [name] [name] is a 49 y/po patient of Dr. 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 Since being seen she fell on some black ice injuring her back and is now in a back brace.   She comes today with complaints of chest pressure and "gas pain" in her chest awakening her from sleep. She treats this with NTG, sometimes taking up to 2 tablets for relief. She denies associated dyspnea, dizziness, or diaphoresis. She is on a long acting nitrate, but this is not helping all the time. She admits to being under a lot of stress at her job which she thinks is contributing to this. She has never been worked up for sleep apnea, but does admit to snoring. Seh complains of swelling in her hands. She has many other noncardiac complaints today which I will not address.   Allergies  Allergen Reactions  . Metoclopramide Hcl   . Metoprolol     Syncope   . Penicillins     REACTION: shock  . Statins     Myalgias    Current Outpatient Prescriptions  Medication Sig Dispense Refill  . aspirin 81 MG tablet Take 81 mg by mouth daily.        . carvedilol (COREG) 12.5 MG tablet Take 1 tablet (12.5 mg total) by mouth 2 (two) times daily with a meal.  180 tablet  3  . citalopram (CELEXA) 20 MG tablet Take 40 mg by mouth daily.       . cyclobenzaprine (FLEXERIL) 10 MG tablet Take 10 mg by mouth 3 (three) times daily as needed.      . digoxin (LANOXIN) 0.25 MG tablet TAKE ONE TABLET BY MOUTH EVERY DAY  30 tablet  3  . famotidine (PEPCID) 20 MG tablet Take 20 mg by mouth 2 (two) times daily.        . fish oil-omega-3 fatty acids 1000 MG capsule Take 1 g by mouth daily.        . furosemide (LASIX) 20 MG tablet 20 mg. ALTERNATE 1 TABLET DAILY WITH 2 TABLET EVERY OTHER DAY      . HYDROcodone-acetaminophen  (NORCO) 10-325 MG per tablet Take 1 tablet by mouth every 6 (six) hours as needed. Take 1/2 tab bid        . ibuprofen (ADVIL,MOTRIN) 200 MG tablet Take 200 mg by mouth every 6 (six) hours as needed.        . isosorbide mononitrate (IMDUR) 30 MG 24 hr tablet Take 1 tablet (30 mg total) by mouth daily.  90 tablet  3  . KLOR-CON M20 20 MEQ tablet TAKE ONE TABLET BY MOUTH EVERY DAY  90 each  2  . Multiple Vitamins-Minerals (MULTIVITAMINS THER. W/MINERALS) TABS Take 1 tablet by mouth daily.        Marland Kitchen NITROSTAT 0.4 MG SL tablet DISSOLVE ONE TABLET UNDER THE TONGUE EVERY 5 MINUTES AS NEEDED FOR CHEST PAIN.  DO NOT EXCEED A TOTAL OF 3 DOSES IN 15 MINUTES  25 each  6  . ondansetron (ZOFRAN) 4 MG tablet Take 4 mg by mouth every 8 (eight) hours as needed.      . warfarin (COUMADIN) 10 MG tablet Take 10 mg by mouth daily.          Past Medical History  Diagnosis Date  . History of pneumonia   .  Chronic systolic heart failure   . 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 Coronary atherosclerosis of native coronary artery     Severe two vessel disease   . Cocaine abuse in remission   . Raynaud's phenomenon   . Essential hypertension, benign   . Hyperlipidemia   . Mixed hyperlipidemia     LVEF 30-35%  . Anticardiolipin antibody positive     Chronic Coumadin    Past Surgical History  Procedure Date  . Cardiac defibrillator placement     St.Jude ICD  . Appendectomy   . Laryngeal polyp excision   . L1 corpectomy  12/10    [id number] of systems complete and found to be negative unless listed above  PHYSICAL EXAM BP 120/77  Pulse 94  Ht 5' 6.5" (1.689 m)  Wt 213 lb 1.3 oz (96.652 kg)  BMI 33.88 kg/m2  General: Well developed, well nourished, in no acute distress Head: Eyes PERRLA, No xanthomas.   Normal cephalic and atramatic  Lungs: Clear bilaterally to auscultation and percussion. Heart: HRRR S1 S2, without MRG.  Pulses are 2+ & equal.            No carotid bruit.  No JVD.  No abdominal bruits. No femoral bruits. Abdomen: Bowel sounds are positive, abdomen soft and non-tender without masses or                  Hernia's noted. Msk:  Back normal, normal gait. Normal strength and tone for age. Extremities: No clubbing, cyanosis or edema.  DP +1 Neuro: Alert and oriented X 3. Psych:  Good affect, responds appropriately  EKG:NSR with frequent PVC's. Rate of 77 bpm.  ASSESSMENT AND PLAN

## 2013-01-25 NOTE — Patient Instructions (Addendum)
Your physician recommends that you schedule a follow-up appointment in: 2-3 WEEKS AFTER TESTS  Your physician has requested that you have an echocardiogram. Echocardiography is a painless test that uses sound waves to create images of your heart. It provides your doctor with information about the size and shape of your heart and how well your heart's chambers and valves are working. This procedure takes approximately one hour. There are no restrictions for this procedure.  Your physician has requested that you have a carotid duplex. This test is an ultrasound of the carotid arteries in your neck. It looks at blood flow through these arteries that supply the brain with blood. Allow one hour for this exam. There are no restrictions or special instructions.  Your physician has requested that have Lexiscan performed  Your physician recommends that you return for lab work in: THIS WEEK  (CMET,MAG, LIPIDS) SLIPS GIVEN

## 2013-01-25 NOTE — Assessment & Plan Note (Signed)
She is requesting follow up carotid doppler for assessment of carotid disease with known total occlusion of the right ICA.

## 2013-01-25 NOTE — Assessment & Plan Note (Signed)
Follow up with scheduled appointments. Last in office interrogation was in April of 2013.

## 2013-01-25 NOTE — Assessment & Plan Note (Addendum)
She is followed by Dr. Diona Browner with MI in Nov of 2008  She was  cathed and found to have an occluded diagonal branch with diffuse LAD   disease and occluded circumflex and no critical disease in her right.   Her arteries were not thought suitable for angioplasty or bypass.with most recent nuclear stress test in 2011 were abnormal:  Abnormal pharmacologic stress nuclear myocardial study revealing a nondiagnostic EKG due to the presence of baseline abnormalities, mild to moderate left ventricular dilatation and severe impairment of left ventricular systolic function in a segmental pattern. By scintigraphic imaging, there was extensive infarction in the territory of the left anterior descending coronary artery as well as additional scarring at the base of the posterolateral wall. No myocardial ischemia identified.  She is having chest pain at rest, sometimes awakening her, relieved with NTG but not always. I will repeat her nuclear study for ongoing assessment. She may need to have a repeat cardiac cath, with possible intervention should there be areas of ischemia. Echocardiogram will be completed as well for assessment of LV fx and need for medication adjustment.

## 2013-01-25 NOTE — Progress Notes (Deleted)
Name: [name] [name]    DOB: [birth year]  Age: 52 y.o.  MR#: [id number]       PCP:  Fredirick Maudlin, MD      Insurance: @PAYORNAME @   CC:    Chief Complaint  Patient presents with  . Follow-up    Dizziness    VS BP 120/77  Pulse 94  Ht 5' 6.5" (1.689 m)  Wt 213 lb 1.3 oz (96.652 kg)  BMI 33.88 kg/m2  Weights Current Weight  01/25/13 213 lb 1.3 oz (96.652 kg)  10/12/12 215 lb (97.523 kg)  06/01/12 211 lb (95.709 kg)    Blood Pressure  BP Readings from Last 3 Encounters:  01/25/13 120/77  10/12/12 100/68  06/01/12 111/65     Admit date:  (Not on file) Last encounter with RMR:  Visit date not found   Allergy Allergies  Allergen Reactions  . Metoclopramide Hcl   . Metoprolol     Syncope   . Penicillins     REACTION: shock  . Statins     Myalgias    Current Outpatient Prescriptions  Medication Sig Dispense Refill  . aspirin 81 MG tablet Take 81 mg by mouth daily.        . carvedilol (COREG) 12.5 MG tablet Take 1 tablet (12.5 mg total) by mouth 2 (two) times daily with a meal.  180 tablet  3  . citalopram (CELEXA) 20 MG tablet Take 40 mg by mouth daily.       . cyclobenzaprine (FLEXERIL) 10 MG tablet Take 10 mg by mouth 3 (three) times daily as needed.      . digoxin (LANOXIN) 0.25 MG tablet TAKE ONE TABLET BY MOUTH EVERY DAY  30 tablet  3  . famotidine (PEPCID) 20 MG tablet Take 20 mg by mouth 2 (two) times daily.        . fish oil-omega-3 fatty acids 1000 MG capsule Take 1 g by mouth daily.        . furosemide (LASIX) 20 MG tablet 20 mg. ALTERNATE 1 TABLET DAILY WITH 2 TABLET EVERY OTHER DAY      . HYDROcodone-acetaminophen (NORCO) 10-325 MG per tablet Take 1 tablet by mouth every 6 (six) hours as needed. Take 1/2 tab bid        . ibuprofen (ADVIL,MOTRIN) 200 MG tablet Take 200 mg by mouth every 6 (six) hours as needed.        . isosorbide mononitrate (IMDUR) 30 MG 24 hr tablet Take 1 tablet (30 mg total) by mouth daily.  90 tablet  3  . KLOR-CON M20 20 MEQ  tablet TAKE ONE TABLET BY MOUTH EVERY DAY  90 each  2  . Multiple Vitamins-Minerals (MULTIVITAMINS THER. W/MINERALS) TABS Take 1 tablet by mouth daily.        Marland Kitchen NITROSTAT 0.4 MG SL tablet DISSOLVE ONE TABLET UNDER THE TONGUE EVERY 5 MINUTES AS NEEDED FOR CHEST PAIN.  DO NOT EXCEED A TOTAL OF 3 DOSES IN 15 MINUTES  25 each  6  . ondansetron (ZOFRAN) 4 MG tablet Take 4 mg by mouth every 8 (eight) hours as needed.      . warfarin (COUMADIN) 10 MG tablet Take 10 mg by mouth daily.          Discontinued Meds:    Medications Discontinued During This Encounter  Medication Reason  . furosemide (LASIX) 20 MG tablet     Patient Active Problem List  Diagnosis  . HYPERLIPIDEMIA  . ESSENTIAL HYPERTENSION, BENIGN  .  CARDIOMYOPATHY, ISCHEMIC  . CEREBROVASCULAR DISEASE  . CLAUDICATION  . CHEST PAIN  . ANTICARDIOLIPIN ANTIBODY SYNDROME  . Long term current use of anticoagulant  . ICD (implantable cardiac defibrillator), single, in situ    LABS Anti-coag visit on 01/07/2013  Component Date Value  . INR 01/07/2013 2.0   Clinical Support on 01/07/2013  Component Date Value  . DEVICE MODEL ICD 01/07/2013 409811   . [id number] 01/07/2013 Lewayne Bunting   M.D.   . Nira Conn 01/07/2013 N   . [id number] 01/07/2013 Lewayne Bunting   M.D.   . Sherlon Handing 01/07/2013 Lewayne Bunting   M.D.   . EVAL-0005E8 01/07/2013 [facility] Patient Management System   . EVAL-0022E8 01/07/2013                     Value:ICD remote received. Sensing, impedances consistent with previous device measurements. Histograms appropriate for patient and level of activity. No mode switch episodes recorded. 1 episode of VT treated with ATP. All other diagnostic data reviewed and is                          appropriate and stable for patient. Plan Latitude 04-08-2013  . CHARGE TIME 01/07/2013 9.5   . AL IMPEDENCE ICD 01/07/2013 535   . RV LEAD IMPEDENCE ICD 01/07/2013 511   . HV IMPEDENCE 01/07/2013 9.5   . BATTERY VOLTAGE  01/07/2013 2.94   . VF 01/07/2013 0   . FVT 01/07/2013 1   . PACEART VT 01/07/2013 0   . MODE SWITCH EPISODES 01/07/2013 0   . VENTRICULAR PACING ICD 01/07/2013 0   . ATRIAL PACING ICD 01/07/2013 0   . TOT-0006 01/07/2013 [id number]   . AL AMPLITUDE 01/07/2013 1.3   . RV LEAD AMPLITUDE 01/07/2013 21.6   . TZON-0002FASTVT 01/07/2013 ATP + 5 Shocks   . TZON-0003FASTVT 01/07/2013 352.9   . TZAT-0001FASTVT 01/07/2013 1   . TZAT-0002FASTVT 01/07/2013 Y   . TZAT-0003FASTVT 01/07/2013 Scan   . TZAT-0013FASTVT 01/07/2013 4   . TZAT-0018FASTVT 01/07/2013 N   . TZAT-0001FASTVT 01/07/2013 2   . TZAT-0002FASTVT 01/07/2013 N   . TZAT-0003FASTVT 01/07/2013 Disabled   . TZAT-0013FASTVT 01/07/2013 Off   . TZAT-0018FASTVT 01/07/2013 N   . TZST-0001FASTVT 01/07/2013 3   . TZST-0002FASTVT 01/07/2013 Y   . TZST-0003FASTVT 01/07/2013 6   . TZST-0001FASTVT 01/07/2013 4   . TZST-0002FASTVT 01/07/2013 Y   . TZST-0003FASTVT 01/07/2013 31   . TZST-0001FASTVT 01/07/2013 5   . TZST-0002FASTVT 01/07/2013 Y   . TZST-0003FASTVT 01/07/2013 41   . TZST-0001FASTVT 01/07/2013 6   . TZST-0002FASTVT 01/07/2013 Y   . TZST-0003FASTVT 01/07/2013 41   . TZST-0001FASTVT 01/07/2013 7   . TZST-0002FASTVT 01/07/2013 Y   . TZST-0003FASTVT 01/07/2013 41      Results for this Opt Visit:     Results for orders placed in visit on 01/07/13  POCT INR      Component Value Range   INR 2.0      EKG Orders placed in visit on 01/25/13  . EKG 12-LEAD     Prior Assessment and Plan Problem List as of 01/25/2013          HYPERLIPIDEMIA   Last Assessment & Plan Note   12/12/2011 Office Visit Signed 12/12/2011  3:29 PM by Jodelle Gross, NP    She is unable to tolerate statins as she has myalgia's. Will start Fish Oil 1000 mg daily. Follow-up labs in 6 months. Low  cholesterol diet and avoiding fast foods is discussed with her.    ESSENTIAL HYPERTENSION, BENIGN   Last Assessment & Plan Note   10/12/2012 Office  Visit Signed 10/12/2012  3:27 PM by [name], MD    Plan to keep her off lisinopril with intermittent hypotension. Otherwise no changes made.    CARDIOMYOPATHY, ISCHEMIC   Last Assessment & Plan Note   10/12/2012 Office Visit Signed 10/12/2012  3:26 PM by [name], MD    Symptomatically stable on present medical regimen. Recommended regular walking for exercise, also diet. Continue observation for now.    CEREBROVASCULAR DISEASE   Last Assessment & Plan Note   06/06/2011 Office Visit Signed 06/06/2011  3:13 PM by [name], MD    Chronically occluded right internal carotid artery, with nonobstructive atherosclerosis on the left.    CLAUDICATION   CHEST PAIN   ANTICARDIOLIPIN ANTIBODY SYNDROME   Last Assessment & Plan Note   10/12/2012 Office Visit Signed 10/12/2012  3:27 PM by [name], MD    She continues on Coumadin with followup through the Coumadin clinic. No reported bleeding episodes.    Long term current use of anticoagulant   ICD (implantable cardiac defibrillator), single, in situ   Last Assessment & Plan Note   10/12/2012 Office Visit Signed 10/12/2012  3:28 PM by [name], MD    Keep followup with Dr. Ladona Ridgel. No device discharges.        Imaging: Dg Lumbar Spine Complete  12/29/2012  *RADIOLOGY REPORT*  Clinical Data: Low back pain, previous surgery  LUMBAR SPINE - COMPLETE 4+ VIEW  Comparison: CT 08/29/2011  Findings: Changes of corpectomy L1 with instrumented left lateral fusion T12-L2, alignment preserved.  No acute fracture. Atheromatous aorta.  Vascular clips in the right lower abdomen.  IMPRESSION:  1.  Stable postop changes T12-L2. 2.  Negative for fracture or other acute abnormality.   Original Report Authenticated By: D. Andria Rhein, MD      FRS Calculation: Score not calculated. Missing: Total Cholesterol

## 2013-01-26 MED ORDER — WARFARIN SODIUM 10 MG PO TABS: 10.0000 mg | ORAL_TABLET | Freq: Every day | ORAL | Status: DC

## 2013-01-26 NOTE — Addendum Note (Signed)
Addended by: Derry Lory A on: 01/26/2013 09:52 AM   Modules accepted: Orders

## 2013-01-27 ENCOUNTER — Other Ambulatory Visit: Payer: Self-pay | Admitting: Cardiology

## 2013-02-02 ENCOUNTER — Encounter (HOSPITAL_COMMUNITY)
Admission: RE | Admit: 2013-02-02 | Discharge: 2013-02-02 | Disposition: A | Discharge status: Home / Self Care | Payer: PRIVATE HEALTH INSURANCE | Source: Ambulatory Visit | Attending: Adult Health | Admitting: Adult Health

## 2013-02-02 ENCOUNTER — Other Ambulatory Visit (HOSPITAL_COMMUNITY): Payer: PRIVATE HEALTH INSURANCE

## 2013-02-02 ENCOUNTER — Ambulatory Visit (HOSPITAL_COMMUNITY)
Admission: RE | Admit: 2013-02-02 | Discharge: 2013-02-02 | Disposition: A | Discharge status: Home / Self Care | Payer: PRIVATE HEALTH INSURANCE | Source: Ambulatory Visit | Attending: Adult Health | Admitting: Adult Health

## 2013-02-02 ENCOUNTER — Encounter (HOSPITAL_COMMUNITY): Payer: Self-pay

## 2013-02-02 DIAGNOSIS — I779 Disorder of arteries and arterioles, unspecified: Secondary | ICD-10-CM

## 2013-02-02 DIAGNOSIS — I6529 Occlusion and stenosis of unspecified carotid artery: Secondary | ICD-10-CM | POA: Insufficient documentation

## 2013-02-02 DIAGNOSIS — I10 Essential (primary) hypertension: Secondary | ICD-10-CM | POA: Insufficient documentation

## 2013-02-02 DIAGNOSIS — I2589 Other forms of chronic ischemic heart disease: Secondary | ICD-10-CM

## 2013-02-02 DIAGNOSIS — F17200 Nicotine dependence, unspecified, uncomplicated: Secondary | ICD-10-CM | POA: Insufficient documentation

## 2013-02-02 DIAGNOSIS — I2510 Atherosclerotic heart disease of native coronary artery without angina pectoris: Secondary | ICD-10-CM | POA: Insufficient documentation

## 2013-02-02 DIAGNOSIS — R079 Chest pain, unspecified: Secondary | ICD-10-CM

## 2013-02-02 DIAGNOSIS — I059 Rheumatic mitral valve disease, unspecified: Secondary | ICD-10-CM

## 2013-02-02 DIAGNOSIS — R42 Dizziness and giddiness: Secondary | ICD-10-CM | POA: Insufficient documentation

## 2013-02-02 MED ORDER — TECHNETIUM TC 99M SESTAMIBI - CARDIOLITE
10.0000 | Freq: Once | INTRAVENOUS | Status: AC | PRN
  Administered 2013-02-02: 10 via INTRAVENOUS

## 2013-02-02 MED ORDER — SODIUM CHLORIDE 0.9 % IJ SOLN
INTRAMUSCULAR | Status: AC
  Administered 2013-02-02: 10 mL via INTRAVENOUS
  Filled 2013-02-02: qty 10

## 2013-02-02 MED ORDER — TECHNETIUM TC 99M SESTAMIBI - CARDIOLITE
30.0000 | Freq: Once | INTRAVENOUS | Status: AC | PRN
  Administered 2013-02-02: 29.7 via INTRAVENOUS

## 2013-02-02 MED ORDER — REGADENOSON 0.4 MG/5ML IV SOLN
INTRAVENOUS | Status: AC
  Administered 2013-02-02: 0.4 mg via INTRAVENOUS
  Filled 2013-02-02: qty 5

## 2013-02-02 NOTE — Progress Notes (Signed)
Stress Lab Nurses Notes - [name]  [name] [name] 02/02/2013 Reason for doing test: Chest Pain Type of test: [name] Nurse performing test: Parke Poisson, RN Nuclear Medicine Tech: Lyndel Pleasure Echo Tech: Not Applicable MD performing test: [name] & [name] NP Family MD: [name] Test explained and consent signed: yes IV started: 22g jelco, Saline lock flushed, No redness or edema and Saline lock started in radiology Symptoms: Nausea & leg discomfort Treatment/Intervention: None Reason test stopped: protocol completed After recovery IV was: Discontinued via X-ray tech and No redness or edema Patient to return to Nuc. Med at :12:15 Patient discharged: Home Patient's Condition upon discharge was: stable Comments: During test BP  [name] T

## 2013-02-02 NOTE — Progress Notes (Signed)
*  PRELIMINARY RESULTS* Echocardiogram 2D Echocardiogram has been performed.  Conrad [place] 02/02/2013, 8:42 AM

## 2013-02-15 ENCOUNTER — Encounter: Payer: Self-pay | Admitting: Adult Health

## 2013-02-15 ENCOUNTER — Ambulatory Visit (INDEPENDENT_AMBULATORY_CARE_PROVIDER_SITE_OTHER): Payer: PRIVATE HEALTH INSURANCE | Admitting: Adult Health

## 2013-02-15 VITALS — BP 139/81 | HR 79 | Ht 66.5 in | Wt 219.2 lb

## 2013-02-15 DIAGNOSIS — I10 Essential (primary) hypertension: Secondary | ICD-10-CM

## 2013-02-15 DIAGNOSIS — I2589 Other forms of chronic ischemic heart disease: Secondary | ICD-10-CM

## 2013-02-15 DIAGNOSIS — I679 Cerebrovascular disease, unspecified: Secondary | ICD-10-CM

## 2013-02-15 DIAGNOSIS — Z95810 Presence of automatic (implantable) cardiac defibrillator: Secondary | ICD-10-CM

## 2013-02-15 MED ORDER — CARVEDILOL 12.5 MG PO TABS: 18.7500 mg | ORAL_TABLET | Freq: Two times a day (BID) | ORAL | Status: DC

## 2013-02-15 NOTE — Assessment & Plan Note (Signed)
Repeat of carotid dopplers demonstrate chronic occlusion of RICA, mild atherosclerotic disease of the left carotid arteries, with estimated degree of stenosis in the LICA less than 50%. Patent vertebral arteries.   No changes in her medication regimen.

## 2013-02-15 NOTE — Assessment & Plan Note (Signed)
Repeat of stress myoview dated on 02/06/2013 demonstrates: "Abnormal pharmacologic stress nuclear myocardial study revealing a large area of myocardial infarction, substantial ventricular dilatation and moderate to severe LV dysfunction in a segmental pattern. No ischemia apparent."  Echocardiogram demonstrated LVED of 25%-30% with diffuse  Hypokinesis, there was akinesis of the basl-mid inferoposterior myocardium/ Grade I diastolic dysfunction. The LA was mildly dilated.  This has been explained to her in detail. She will have dose of carvedilol to 18.75 mg BID for better BP and to assist with palpitations and CM. Will try and titrate up the dose in one month to 25 mg BID. She is to continue other medications as directed including digoxin, lasix, and imdur. She does NOT tolerate ACE due to significant hypotension into the 70's systolic with severe dizziness. Will not restart at this time.

## 2013-02-15 NOTE — Progress Notes (Signed)
HPI: [name] is a 34 y/po patient of Dr. 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 Since being seen she fell on some black ice injuring her back and is now in a back brace. She came to last visit with complaints of chest pressure and "gas pain" in her chest awakening her from sleep. She treats this with NTG, sometimes taking up to 2 tablets for relief.She also had complaints of increased palpitations. She has multiple somatic complaints today from neuralgia pain to anxiety over her father's recent MI, treated at [name] last week. She had a stress test, carotid doppler studies and echocardiogram completed for further assessment of her complaints. She is here to discuss the results.    Allergies  Allergen Reactions  . Metoclopramide Hcl   . Metoprolol     Syncope   . Penicillins     REACTION: shock  . Statins     Myalgias    Current Outpatient Prescriptions  Medication Sig Dispense Refill  . aspirin 81 MG tablet Take 81 mg by mouth daily.        . carvedilol (COREG) 12.5 MG tablet Take 1.5 tablets (18.75 mg total) by mouth 2 (two) times daily with a meal.  90 tablet  6  . citalopram (CELEXA) 20 MG tablet Take 40 mg by mouth daily.       . cyclobenzaprine (FLEXERIL) 10 MG tablet Take 10 mg by mouth 3 (three) times daily as needed.      . digoxin (LANOXIN) 0.25 MG tablet TAKE ONE TABLET BY MOUTH EVERY DAY  30 tablet  3  . famotidine (PEPCID) 20 MG tablet Take 20 mg by mouth 2 (two) times daily.        . fish oil-omega-3 fatty acids 1000 MG capsule Take 1 g by mouth daily.        . furosemide (LASIX) 20 MG tablet 20 mg. ALTERNATE 1 TABLET DAILY WITH 2 TABLET EVERY OTHER DAY      . HYDROcodone-acetaminophen (NORCO) 10-325 MG per tablet Take 1 tablet by mouth every 6 (six)  hours as needed. Take 1/2 tab bid        . ibuprofen (ADVIL,MOTRIN) 200 MG tablet Take 200 mg by mouth every 6 (six) hours as needed.        . isosorbide mononitrate (IMDUR) 30 MG 24 hr tablet Take 1 tablet (30 mg total) by mouth daily.  90 tablet  3  . KLOR-CON M20 20 MEQ tablet TAKE ONE TABLET BY MOUTH EVERY DAY  90 each  2  . Multiple Vitamins-Minerals (MULTIVITAMINS THER. W/MINERALS) TABS Take 1 tablet by mouth daily.        Marland Kitchen NITROSTAT 0.4 MG SL tablet DISSOLVE ONE TABLET UNDER THE TONGUE EVERY 5 MINUTES AS NEEDED FOR CHEST PAIN.  DO NOT EXCEED A TOTAL OF 3 DOSES IN 15 MINUTES  25 each  6  . ondansetron (ZOFRAN) 4 MG tablet Take 4 mg by mouth every 8 (eight) hours as needed.      . warfarin (COUMADIN) 10 MG tablet Take 1 tablet (10 mg total) by mouth daily.  45 tablet  3   No current facility-administered medications for this visit.    Past Medical History  Diagnosis Date  . History of pneumonia   . Chronic systolic heart failure   . Stroke  Total occlusion of the right internal carotid  . Coronary atherosclerosis of native coronary artery     Severe two vessel disease   . Cocaine abuse in remission   . Raynaud's phenomenon   . Essential hypertension, benign   . Hyperlipidemia   . Mixed hyperlipidemia     LVEF 30-35%  . Anticardiolipin antibody positive     Chronic Coumadin    Past Surgical History  Procedure Laterality Date  . Cardiac defibrillator placement      [employer] ICD  . Appendectomy    . Laryngeal polyp excision    . L1 corpectomy   12/10    ZOX:[id number]: Well developed, well nourished, in no acute distress Head: Eyes PERRLA, No xanthomas.   Normal cephalic and atramatic  Lungs: Clear bilaterally to auscultation and percussion. Heart: HRRR S1 S2, without MRG.  Pulses are 2+ & equal.            No carotid bruit. No JVD.  No abdominal bruits. No femoral bruits. Abdomen: Bowel sounds are positive, abdomen soft and non-tender without masses or                   Hernia's noted. Msk:  Back normal, normal gait. Normal strength and tone for age.Wearing back brace.  Extremities: No clubbing, cyanosis or edema.  DP +1 Neuro: Alert and oriented X 3. Psych:  Good affect, responds appropriately  PHYSICAL EXAM BP 139/81  Pulse 79  Ht 5' 6.5" (1.689 m)  Wt 219 lb 4 oz (99.451 kg)  BMI 34.86 kg/m2  SpO2 97%  EKG:NSR with Frequent PVC's.   ASSESSMENT AND PLAN

## 2013-02-15 NOTE — Progress Notes (Deleted)
Name: [name] [name]    DOB: 26-Jul-1961  Age: 52 y.o.  MR#: [id number]       PCP:  Fredirick Maudlin, MD      Insurance: Payor: MEDCOST  Plan: MEDCOST  Product Type: *No Product type*    CC:   PT ADVISED SHE DID NOT HAVE HER LAB WORK UNTIL THIS AM PER WEATHER ISSUES  VS Filed Vitals:   02/15/13 1331  BP: 139/81  Pulse: 79  Height: 5' 6.5" (1.689 m)  Weight: 219 lb 4 oz (99.451 kg)  SpO2: 97%    Weights Current Weight  02/15/13 219 lb 4 oz (99.451 kg)  01/25/13 213 lb 1.3 oz (96.652 kg)  10/12/12 215 lb (97.523 kg)    Blood Pressure  BP Readings from Last 3 Encounters:  02/15/13 139/81  01/25/13 120/77  10/12/12 100/68     Admit date:  (Not on file) Last encounter with RMR:  01/25/2013   Allergy Metoclopramide hcl; Metoprolol; Penicillins; and Statins  Current Outpatient Prescriptions  Medication Sig Dispense Refill  . aspirin 81 MG tablet Take 81 mg by mouth daily.        . carvedilol (COREG) 12.5 MG tablet TAKE ONE TABLET BY MOUTH TWICE DAILY WITH FOOD  180 tablet  2  . citalopram (CELEXA) 20 MG tablet Take 40 mg by mouth daily.       . cyclobenzaprine (FLEXERIL) 10 MG tablet Take 10 mg by mouth 3 (three) times daily as needed.      . digoxin (LANOXIN) 0.25 MG tablet TAKE ONE TABLET BY MOUTH EVERY DAY  30 tablet  3  . famotidine (PEPCID) 20 MG tablet Take 20 mg by mouth 2 (two) times daily.        . fish oil-omega-3 fatty acids 1000 MG capsule Take 1 g by mouth daily.        . furosemide (LASIX) 20 MG tablet 20 mg. ALTERNATE 1 TABLET DAILY WITH 2 TABLET EVERY OTHER DAY      . HYDROcodone-acetaminophen (NORCO) 10-325 MG per tablet Take 1 tablet by mouth every 6 (six) hours as needed. Take 1/2 tab bid        . ibuprofen (ADVIL,MOTRIN) 200 MG tablet Take 200 mg by mouth every 6 (six) hours as needed.        . isosorbide mononitrate (IMDUR) 30 MG 24 hr tablet Take 1 tablet (30 mg total) by mouth daily.  90 tablet  3  . KLOR-CON M20 20 MEQ tablet TAKE ONE TABLET BY MOUTH EVERY  DAY  90 each  2  . Multiple Vitamins-Minerals (MULTIVITAMINS THER. W/MINERALS) TABS Take 1 tablet by mouth daily.        Marland Kitchen NITROSTAT 0.4 MG SL tablet DISSOLVE ONE TABLET UNDER THE TONGUE EVERY 5 MINUTES AS NEEDED FOR CHEST PAIN.  DO NOT EXCEED A TOTAL OF 3 DOSES IN 15 MINUTES  25 each  6  . ondansetron (ZOFRAN) 4 MG tablet Take 4 mg by mouth every 8 (eight) hours as needed.      . warfarin (COUMADIN) 10 MG tablet Take 1 tablet (10 mg total) by mouth daily.  45 tablet  3   No current facility-administered medications for this visit.    Discontinued Meds:   There are no discontinued medications.  Patient Active Problem List  Diagnosis  . HYPERLIPIDEMIA  . ESSENTIAL HYPERTENSION, BENIGN  . CARDIOMYOPATHY, ISCHEMIC  . CEREBROVASCULAR DISEASE  . CLAUDICATION  . CHEST PAIN  . ANTICARDIOLIPIN ANTIBODY SYNDROME  . Long  term current use of anticoagulant  . Single implantable cardiac defibrillator in situ    LABS @BMET3 @  @CMPRESULT3 @ @CBC3 @  Lipid Panel     Component Value Date/Time   CHOL 180 02/27/2011 2019   TRIG 80 02/27/2011 2019   HDL 42 02/27/2011 2019   CHOLHDL 4.3 Ratio 02/27/2011 2019   VLDL 16 02/27/2011 2019   LDLCALC 122* 02/27/2011 2019    ABG    Component Value Date/Time   PHART 7.454* 12/15/2009 0420   PCO2ART 39.3 12/15/2009 0420   PO2ART 66.0* 12/15/2009 0420   HCO3 27.1* 12/15/2009 0420   TCO2 28.3 12/15/2009 0420   O2SAT 93.2 12/15/2009 0420     BNP (last 3 results) No results found for this basename: PROBNP,  in the last 8760 hours Cardiac Panel (last 3 results) No results found for this basename: CKTOTAL, CKMB, TROPONINI, RELINDX,  in the last 72 hours  Iron/TIBC/Ferritin    Component Value Date/Time   IRON 18* 12/20/2009 0615   TIBC 239* 12/20/2009 0615   FERRITIN 145 12/20/2009 0615     EKG Orders placed in visit on 01/25/13  . EKG 12-LEAD     Prior Assessment and Plan Problem List as of 02/15/2013     ICD-9-CM   HYPERLIPIDEMIA   Last  Assessment & Plan   12/12/2011 Office Visit Written 12/12/2011  3:29 PM by Jodelle Gross, NP     She is unable to tolerate statins as she has myalgia's. Will start Fish Oil 1000 mg daily. Follow-up labs in 6 months. Low cholesterol diet and avoiding fast foods is discussed with her.    ESSENTIAL HYPERTENSION, BENIGN   Last Assessment & Plan   10/12/2012 Office Visit Written 10/12/2012  3:27 PM by [name], MD     Plan to keep her off lisinopril with intermittent hypotension. Otherwise no changes made.    CARDIOMYOPATHY, ISCHEMIC   Last Assessment & Plan   01/25/2013 Office Visit Edited 01/25/2013  4:14 PM by Jodelle Gross, NP     She is followed by Dr. Diona Browner with MI in Nov of 2008  She was  cathed and found to have an occluded diagonal branch with diffuse LAD   disease and occluded circumflex and no critical disease in her right.   Her arteries were not thought suitable for angioplasty or bypass.with most recent nuclear stress test in 2011 were abnormal:  Abnormal pharmacologic stress nuclear myocardial study revealing a nondiagnostic EKG due to the presence of baseline abnormalities, mild to moderate left ventricular dilatation and severe impairment of left ventricular systolic function in a segmental pattern. By scintigraphic imaging, there was extensive infarction in the territory of the left anterior descending coronary artery as well as additional scarring at the base of the posterolateral wall. No myocardial ischemia identified.  She is having chest pain at rest, sometimes awakening her, relieved with NTG but not always. I will repeat her nuclear study for ongoing assessment. She may need to have a repeat cardiac cath, with possible intervention should there be areas of ischemia. Echocardiogram will be completed as well for assessment of LV fx and need for medication adjustment.     CEREBROVASCULAR DISEASE   Last Assessment & Plan   01/25/2013 Office Visit Written  01/25/2013  4:15 PM by Jodelle Gross, NP     She is requesting follow up carotid doppler for assessment of carotid disease with known total occlusion of the right ICA.    CLAUDICATION  CHEST PAIN   ANTICARDIOLIPIN ANTIBODY SYNDROME   Last Assessment & Plan   01/25/2013 Office Visit Written 01/25/2013  4:16 PM by Jodelle Gross, NP     She follows in our coumadin clinic every 6 weeks. Continue this as directed.     Long term current use of anticoagulant   Single implantable cardiac defibrillator in situ   Last Assessment & Plan   01/25/2013 Office Visit Written 01/25/2013  4:16 PM by Jodelle Gross, NP     Follow up with scheduled appointments. Last in office interrogation was in April of 2013.         Imaging: US Carotid Duplex Bilateral  02/02/2013  *RADIOLOGY REPORT*  Clinical Data: Dizzy spells and history of right internal carotid artery occlusion.  BILATERAL CAROTID DUPLEX ULTRASOUND  Technique: Wallace Cullens scale imaging, color Doppler and duplex ultrasound was performed of bilateral carotid and vertebral arteries in the neck.  Comparison:  Prior ultrasound 02/28/2011  Criteria:  Quantification of carotid stenosis is based on velocity parameters that correlate the residual internal carotid diameter with NASCET-based stenosis levels, using the diameter of the distal internal carotid lumen as the denominator for stenosis measurement.  The following velocity measurements were obtained:                   PEAK SYSTOLIC/END DIASTOLIC RIGHT CCA:                        72cm/sec ECA:                        220cm/sec  LEFT ICA:                        107cm/sec CCA:                        117cm/sec SYSTOLIC ICA/CCA RATIO:     0.9 DIASTOLIC ICA/CCA RATIO:    1.9 ECA:                        116cm/sec  Findings:  RIGHT CAROTID ARTERY: Again noted is complete occlusion of the right internal carotid artery.  The right carotid bulb and right external carotid artery are patent.  Peak systolic velocity in  the right external carotid artery is slightly elevated but not significantly changed.  Small amount of plaque in the right carotid bulb.  RIGHT VERTEBRAL ARTERY:  Antegrade flow and normal waveform in the right vertebral artery.  LEFT CAROTID ARTERY: Small amount of plaque at the left carotid bulb and proximal internal carotid artery.  Normal waveforms and velocities in the left internal carotid artery.  The left external carotid artery is patent.  LEFT VERTEBRAL ARTERY:  Antegrade flow and normal waveform in the left vertebral artery.  IMPRESSION: Chronic occlusion of the right internal carotid artery.  Mild atherosclerotic disease in the left carotid arteries. Estimated degree of stenosis in the left internal carotid artery is less than 50%. No significant change from the previous examination.  Patent vertebral arteries.   Original Report Authenticated By: Richarda Overlie, M.D.    Nm Myocar Single W/spect W/wall Motion And Ef  02/06/2013  nm myoview pharmacologic stress  Ordering Physician: [name]  Reading Physician: [place] Bing  Clinical Data: 52 year old woman with ischemic cardiomyopathy experiencing chest discomfort.  NUCLEAR MEDICINE ADENOSINE STRESS MYOVIEW STUDY WITH SPECT  AND LEFT VENTRIUCLAR EJECTION FRACTION  Radionuclide Data: One-day rest/stress protocol performed with 10/30 mCi of Tc-55m Myoview.  Stress Data: Regadenoson infusion resulted in no significant symptoms.  There was a typical increase in heart rate but substantial decline in blood pressure to a minimum of 74/59 following drug administration.  No arrhythmias noted.  EKG: Normal sinus rhythm; borderline left atrial abnormality; possible prior septal myocardial infarction; nonspecific ST-segment abnormality; occasional PVC.  With pharmacologic stress, there was a modest increase in baseline ST-segment depression.  Scintigraphic Data: Acquisition notable for moderate movement, especially during the resting portion of the study.  The  left ventricle was moderately to markedly dilated.  On tomographic images reconstructed in standard planes, there was a large defect involving the mid and distal  inferolateral and anterolateral segments as well as the entire apex.  Perfusion was only normal in the inferoseptal region.  The resting images indicated no reversibility.  The gated reconstruction demonstrated mild apical dyskinesis and distal anteroseptal akinesis with moderate to severe impairment in overall left ventricular systolic function. Only the inferoseptal segment and basilar and mid portions of the anterolateral wall showed normal contractility.  Estimated ejection fraction was 28%.  IMPRESSION: Abnormal pharmacologic stress nuclear myocardial study revealing a large area of myocardial infarction as described, substantial left ventricular dilatation and moderate to severe left ventricular dysfunction in a segmental pattern.  No ischemia apparent.   Original Report Authenticated By:  Bing

## 2013-02-15 NOTE — Assessment & Plan Note (Signed)
Will follow up with Dr.Taylor on previously scheduled appointment,.

## 2013-02-15 NOTE — Patient Instructions (Addendum)
Your physician recommends that you schedule a follow-up appointment in: 6 MONTHS  Your physician has recommended you make the following change in your medication:   1) INCREASE COREG TO 18.75MG  TWICE DAILY (TAKE ONE AND A HALF TABLETS TWICE DAILY)

## 2013-02-15 NOTE — Assessment & Plan Note (Signed)
Mildly hypertensive in the setting of severe systolic dysfunction, EF of 25%. Increased dose of carvedilol is prescribed. Will see her on follow up. She is to continue daily wts.

## 2013-02-16 LAB — COMPREHENSIVE METABOLIC PANEL
ALT: 26 U/L (ref 0–35)
AST: 25 U/L (ref 0–37)
Albumin: 3.7 g/dL (ref 3.5–5.2)
Alkaline Phosphatase: 70 U/L (ref 39–117)
BUN: 14 mg/dL (ref 6–23)
CO2: 30 mEq/L (ref 19–32)
Calcium: 9.3 mg/dL (ref 8.4–10.5)
Chloride: 101 mEq/L (ref 96–112)
Creat: 0.71 mg/dL (ref 0.50–1.10)
Glucose, Bld: 97 mg/dL (ref 70–99)
Potassium: 4.3 mEq/L (ref 3.5–5.3)
Sodium: 139 mEq/L (ref 135–145)
Total Bilirubin: 0.3 mg/dL (ref 0.3–1.2)
Total Protein: 6.9 g/dL (ref 6.0–8.3)

## 2013-02-16 LAB — MAGNESIUM: Magnesium: 2.1 mg/dL (ref 1.5–2.5)

## 2013-02-16 LAB — LIPID PANEL
Cholesterol: 189 mg/dL (ref 0–200)
HDL: 36 mg/dL — ABNORMAL LOW (ref >39)
LDL Cholesterol: 124 mg/dL — ABNORMAL HIGH (ref 0–99)
Total CHOL/HDL Ratio: 5.3 Ratio
Triglycerides: 144 mg/dL (ref <150)
VLDL: 29 mg/dL (ref 0–40)

## 2013-02-17 ENCOUNTER — Encounter: Payer: Self-pay | Admitting: *Deleted

## 2013-02-25 ENCOUNTER — Other Ambulatory Visit: Payer: Self-pay | Admitting: Neurosurgery

## 2013-02-25 DIAGNOSIS — M549 Dorsalgia, unspecified: Secondary | ICD-10-CM

## 2013-02-26 ENCOUNTER — Telehealth: Payer: Self-pay | Admitting: *Deleted

## 2013-02-26 NOTE — Telephone Encounter (Signed)
Received fax from [facility].  Pt needs to have lumbar mylogram and MD needs to know if it is OK for pt to hold coumadin x 4 days.  This was discussed with Dr Diona Browner and due to her diagnosis and the nature of the pending procedure it would be best not to bridge pt and to hold coumadin 4 days.  I spoke to [facility] and relayed this information and faxed hard copy to her as well.  She will let me know when procedure is scheduled and I will check INR before test to verify that INR is 1.5 or less.  Called pt with these instructions and she verbalized understanding.

## 2013-02-27 NOTE — Telephone Encounter (Signed)
Agree as per our discussion.

## 2013-03-04 ENCOUNTER — Ambulatory Visit
Admission: RE | Admit: 2013-03-04 | Discharge: 2013-03-04 | Disposition: A | Discharge status: Home / Self Care | Payer: PRIVATE HEALTH INSURANCE | Source: Ambulatory Visit | Attending: Neurosurgery | Admitting: Neurosurgery

## 2013-03-04 ENCOUNTER — Ambulatory Visit (INDEPENDENT_AMBULATORY_CARE_PROVIDER_SITE_OTHER): Payer: PRIVATE HEALTH INSURANCE | Admitting: *Deleted

## 2013-03-04 VITALS — BP 114/57 | HR 64

## 2013-03-04 DIAGNOSIS — R894 Abnormal immunological findings in specimens from other organs, systems and tissues: Secondary | ICD-10-CM

## 2013-03-04 DIAGNOSIS — M549 Dorsalgia, unspecified: Secondary | ICD-10-CM

## 2013-03-04 DIAGNOSIS — I679 Cerebrovascular disease, unspecified: Secondary | ICD-10-CM

## 2013-03-04 DIAGNOSIS — Z7901 Long term (current) use of anticoagulants: Secondary | ICD-10-CM

## 2013-03-04 LAB — POCT INR: INR: 1.1

## 2013-03-04 MED ORDER — IOHEXOL 180 MG/ML  SOLN
15.0000 mL | Freq: Once | INTRAMUSCULAR | Status: AC | PRN
  Administered 2013-03-04: 15 mL via INTRATHECAL

## 2013-03-04 MED ORDER — DIAZEPAM 5 MG PO TABS
10.0000 mg | ORAL_TABLET | Freq: Once | ORAL | Status: AC
  Administered 2013-03-04: 10 mg via ORAL

## 2013-03-04 NOTE — Progress Notes (Signed)
Pt has stopped citalopram and warfarin as instructed. She failed to get INR checked and has gone to [facility] on [address] to have check done.

## 2013-03-07 ENCOUNTER — Other Ambulatory Visit: Payer: Self-pay | Admitting: Cardiology

## 2013-03-22 ENCOUNTER — Ambulatory Visit (HOSPITAL_COMMUNITY): Payer: PRIVATE HEALTH INSURANCE | Admitting: Physical Therapy

## 2013-03-29 ENCOUNTER — Ambulatory Visit (HOSPITAL_COMMUNITY)
Admission: RE | Admit: 2013-03-29 | Payer: PRIVATE HEALTH INSURANCE | Source: Ambulatory Visit | Admitting: Physical Therapy

## 2013-04-05 ENCOUNTER — Telehealth: Payer: Self-pay | Admitting: Cardiology

## 2013-04-05 NOTE — Telephone Encounter (Signed)
Dr.Hawkins started her on Bactrum. Please call with instructions. / tgs

## 2013-04-05 NOTE — Telephone Encounter (Signed)
Pt missed last coumadin appt 3/17.  INR before that was 1.1 prior to procedure.  LMOM for pt to start Bactrim and come in for INR check on 4/10.   She is to call back for appt.

## 2013-04-08 ENCOUNTER — Ambulatory Visit (INDEPENDENT_AMBULATORY_CARE_PROVIDER_SITE_OTHER): Payer: PRIVATE HEALTH INSURANCE | Admitting: *Deleted

## 2013-04-08 ENCOUNTER — Other Ambulatory Visit: Payer: Self-pay | Admitting: Internal Medicine

## 2013-04-08 DIAGNOSIS — Z95810 Presence of automatic (implantable) cardiac defibrillator: Secondary | ICD-10-CM

## 2013-04-08 DIAGNOSIS — I2589 Other forms of chronic ischemic heart disease: Secondary | ICD-10-CM

## 2013-04-15 LAB — REMOTE ICD DEVICE
AL AMPLITUDE: 1.5 mv
AL IMPEDENCE ICD: 547 Ohm
ATRIAL PACING ICD: 0 pct
BATTERY VOLTAGE: 2.93 V
CHARGE TIME: 9.5 s
DEV-0020ICD: NEGATIVE
DEVICE MODEL ICD: 504498
FVT: 0
HV IMPEDENCE: 47 Ohm
MODE SWITCH EPISODES: 0
PACEART VT: 0
RV LEAD AMPLITUDE: 23.4 mv
RV LEAD IMPEDENCE ICD: 500 Ohm
TOT-0006: 20140109000000
TZAT-0001FASTVT: 1
TZAT-0001FASTVT: 2
TZAT-0002FASTVT: NEGATIVE
TZAT-0013FASTVT: 4
TZAT-0018FASTVT: NEGATIVE
TZAT-0018FASTVT: NEGATIVE
TZON-0003FASTVT: 352.9 ms
TZST-0001FASTVT: 3
TZST-0001FASTVT: 4
TZST-0001FASTVT: 5
TZST-0001FASTVT: 6
TZST-0001FASTVT: 7
TZST-0003FASTVT: 31 J
TZST-0003FASTVT: 41 J
TZST-0003FASTVT: 41 J
TZST-0003FASTVT: 41 J
TZST-0003FASTVT: 6 J
VENTRICULAR PACING ICD: 0 pct
VF: 0

## 2013-04-27 ENCOUNTER — Encounter: Payer: Self-pay | Admitting: *Deleted

## 2013-04-28 ENCOUNTER — Ambulatory Visit (INDEPENDENT_AMBULATORY_CARE_PROVIDER_SITE_OTHER): Payer: PRIVATE HEALTH INSURANCE | Admitting: *Deleted

## 2013-04-28 DIAGNOSIS — Z7901 Long term (current) use of anticoagulants: Secondary | ICD-10-CM

## 2013-04-28 DIAGNOSIS — I679 Cerebrovascular disease, unspecified: Secondary | ICD-10-CM

## 2013-04-28 DIAGNOSIS — R894 Abnormal immunological findings in specimens from other organs, systems and tissues: Secondary | ICD-10-CM

## 2013-04-28 LAB — POCT INR: INR: 2.2

## 2013-04-30 ENCOUNTER — Encounter: Payer: Self-pay | Admitting: Internal Medicine

## 2013-05-13 ENCOUNTER — Ambulatory Visit: Payer: PRIVATE HEALTH INSURANCE | Admitting: Adult Health

## 2013-05-18 ENCOUNTER — Encounter: Payer: PRIVATE HEALTH INSURANCE | Admitting: Adult Health

## 2013-05-18 NOTE — Progress Notes (Signed)
HPI this is a resident 52 year old patient of Dr. Diona Browner worse ongoing assessment treatment of ischemic heart myopathy, hypertension, carotid artery disease, hypercholesterolemia. History of anti-cardiolipin syndrome on chronic Coumadin therapy. She has an ICD pacemaker in situ. She has multiple medical issues followed by multiple physicians.  She was last seen in the office in February 2014, again with multiple somatic complaints and anxiety. There is a stress Myoview in February 2014 revealing an LVEF of 25-30% with diffuse hypokinesis and akinesis of the basal and mid inferior posterior myocardium. Grade 1 diastolic dysfunction was noted. It also demonstrated a large area myocardial infarction no ischemia.  Allergies  Allergen Reactions  . Penicillins Anaphylaxis      shock  . Metoclopramide Hcl   . Metoprolol     Syncope   . Statins     Myalgias    Current Outpatient Prescriptions  Medication Sig Dispense Refill  . aspirin 81 MG tablet Take 81 mg by mouth daily.        . carvedilol (COREG) 12.5 MG tablet Take 1.5 tablets (18.75 mg total) by mouth 2 (two) times daily with a meal.  90 tablet  6  . citalopram (CELEXA) 20 MG tablet Take 40 mg by mouth daily.       . cyclobenzaprine (FLEXERIL) 10 MG tablet Take 10 mg by mouth 3 (three) times daily as needed.      . digoxin (LANOXIN) 0.25 MG tablet TAKE ONE TABLET BY MOUTH EVERY DAY  30 tablet  3  . famotidine (PEPCID) 20 MG tablet Take 20 mg by mouth 2 (two) times daily.        . fish oil-omega-3 fatty acids 1000 MG capsule Take 1 g by mouth daily.        . furosemide (LASIX) 20 MG tablet 20 mg. ALTERNATE 1 TABLET DAILY WITH 2 TABLET EVERY OTHER DAY      . furosemide (LASIX) 20 MG tablet ALTERNATE TAKING ONE TABLET BY MOUTH EVERY OTHER DAY AND ONE-HALF TABLET EVERY OTHER DAY  180 tablet  3  . HYDROcodone-acetaminophen (NORCO) 10-325 MG per tablet Take 1 tablet by mouth every 6 (six) hours as needed. Take 1/2 tab bid        .  ibuprofen (ADVIL,MOTRIN) 200 MG tablet Take 200 mg by mouth every 6 (six) hours as needed.        . isosorbide mononitrate (IMDUR) 30 MG 24 hr tablet Take 1 tablet (30 mg total) by mouth daily.  90 tablet  3  . KLOR-CON M20 20 MEQ tablet TAKE ONE TABLET BY MOUTH EVERY DAY  90 each  2  . Multiple Vitamins-Minerals (MULTIVITAMINS THER. W/MINERALS) TABS Take 1 tablet by mouth daily.        Marland Kitchen NITROSTAT 0.4 MG SL tablet DISSOLVE ONE TABLET UNDER THE TONGUE EVERY 5 MINUTES AS NEEDED FOR CHEST PAIN.  DO NOT EXCEED A TOTAL OF 3 DOSES IN 15 MINUTES  25 each  6  . ondansetron (ZOFRAN) 4 MG tablet Take 4 mg by mouth every 8 (eight) hours as needed.      . warfarin (COUMADIN) 10 MG tablet Take 1 tablet (10 mg total) by mouth daily.  45 tablet  3   No current facility-administered medications for this visit.    Past Medical History  Diagnosis Date  . History of pneumonia   . Chronic systolic heart failure   . 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 Coronary atherosclerosis  of native coronary artery     Severe two vessel disease   . Cocaine abuse in remission   . Raynaud's phenomenon   . Essential hypertension, benign   . Hyperlipidemia   . Mixed hyperlipidemia     LVEF 30-35%  . Anticardiolipin antibody positive     Chronic Coumadin    Past Surgical History  Procedure Laterality Date  . Cardiac defibrillator placement      St.Jude ICD  . Appendectomy    . Laryngeal polyp excision    . L1 corpectomy   12/10    ROS: PHYSICAL EXAM There were no vitals taken for this visit.  EKG:  ASSESSMENT AND PLAN

## 2013-05-23 ENCOUNTER — Other Ambulatory Visit: Payer: Self-pay | Admitting: Internal Medicine

## 2013-05-23 ENCOUNTER — Other Ambulatory Visit: Payer: Self-pay | Admitting: Cardiology

## 2013-06-03 ENCOUNTER — Encounter (HOSPITAL_COMMUNITY): Payer: Self-pay | Admitting: Cardiology

## 2013-07-27 ENCOUNTER — Encounter: Payer: Self-pay | Admitting: *Deleted

## 2013-09-02 ENCOUNTER — Ambulatory Visit (INDEPENDENT_AMBULATORY_CARE_PROVIDER_SITE_OTHER): Payer: Self-pay | Admitting: *Deleted

## 2013-09-02 DIAGNOSIS — R894 Abnormal immunological findings in specimens from other organs, systems and tissues: Secondary | ICD-10-CM

## 2013-09-02 DIAGNOSIS — Z7901 Long term (current) use of anticoagulants: Secondary | ICD-10-CM

## 2013-09-02 DIAGNOSIS — I679 Cerebrovascular disease, unspecified: Secondary | ICD-10-CM

## 2013-09-02 LAB — POCT INR: INR: 4

## 2013-09-16 ENCOUNTER — Ambulatory Visit: Payer: Self-pay | Admitting: Cardiology

## 2013-09-16 ENCOUNTER — Ambulatory Visit (INDEPENDENT_AMBULATORY_CARE_PROVIDER_SITE_OTHER): Payer: Self-pay | Admitting: *Deleted

## 2013-09-16 DIAGNOSIS — I679 Cerebrovascular disease, unspecified: Secondary | ICD-10-CM

## 2013-09-16 DIAGNOSIS — Z7901 Long term (current) use of anticoagulants: Secondary | ICD-10-CM

## 2013-09-16 DIAGNOSIS — R894 Abnormal immunological findings in specimens from other organs, systems and tissues: Secondary | ICD-10-CM

## 2013-09-16 LAB — POCT INR: INR: 1.7

## 2013-09-21 ENCOUNTER — Ambulatory Visit: Payer: Self-pay | Admitting: Cardiology

## 2013-09-27 ENCOUNTER — Other Ambulatory Visit: Payer: Self-pay | Admitting: *Deleted

## 2013-09-27 MED ORDER — ISOSORBIDE MONONITRATE ER 30 MG PO TB24: ORAL_TABLET | ORAL | Status: DC

## 2013-10-01 ENCOUNTER — Other Ambulatory Visit: Payer: Self-pay | Admitting: *Deleted

## 2013-10-01 ENCOUNTER — Encounter: Payer: Self-pay | Admitting: Cardiology

## 2013-10-01 ENCOUNTER — Ambulatory Visit (INDEPENDENT_AMBULATORY_CARE_PROVIDER_SITE_OTHER): Payer: Self-pay | Admitting: Cardiology

## 2013-10-01 VITALS — BP 134/76 | HR 66 | Ht 68.0 in | Wt 211.0 lb

## 2013-10-01 DIAGNOSIS — I2589 Other forms of chronic ischemic heart disease: Secondary | ICD-10-CM

## 2013-10-01 DIAGNOSIS — Z79899 Other long term (current) drug therapy: Secondary | ICD-10-CM

## 2013-10-01 DIAGNOSIS — I255 Ischemic cardiomyopathy: Secondary | ICD-10-CM

## 2013-10-01 DIAGNOSIS — R079 Chest pain, unspecified: Secondary | ICD-10-CM

## 2013-10-01 DIAGNOSIS — I10 Essential (primary) hypertension: Secondary | ICD-10-CM

## 2013-10-01 DIAGNOSIS — I739 Peripheral vascular disease, unspecified: Secondary | ICD-10-CM

## 2013-10-01 DIAGNOSIS — R894 Abnormal immunological findings in specimens from other organs, systems and tissues: Secondary | ICD-10-CM

## 2013-10-01 DIAGNOSIS — E785 Hyperlipidemia, unspecified: Secondary | ICD-10-CM

## 2013-10-01 NOTE — Assessment & Plan Note (Signed)
No change in current regimen. 

## 2013-10-01 NOTE — Assessment & Plan Note (Signed)
Keep followup with Dr. Taylor. 

## 2013-10-01 NOTE — Assessment & Plan Note (Signed)
LDL was 124 in February. Recheck FLP and LFTs. Might be able to try low-dose Crestor as a next option.

## 2013-10-01 NOTE — Assessment & Plan Note (Signed)
LVEF 25-30%, no active ischemia by Myoview earlier this year. Plan is to continue medical therapy. Somewhat limited by orthostasis, in fact Coreg dose had to be reduced. She denies any palpitations or device discharges. I recommended that she continue to stay active as tolerated, a more regular walking regimen.

## 2013-10-01 NOTE — Assessment & Plan Note (Signed)
Prior history of stroke, occluded right ICA, on chronic Coumadin.

## 2013-10-01 NOTE — Patient Instructions (Addendum)
Continue Coreg 12.5mg  twice a day  Continue all other medications.   Lower extremity arterial doppler  Office will contact with results via phone or letter.    Labs for fasting lipid & liver panel Your physician wants you to follow up in: 6 months.  You will receive a reminder letter in the mail one-two months in advance.  If you don't receive a letter, please call our office to schedule the follow up appointment

## 2013-10-01 NOTE — Progress Notes (Signed)
Clinical Summary Ms. Yarbough is a 52 y.o.female last seen in February by Ms. Lawrence NP. States that she is currently out of work. She reports compliance with her medications, Coreg dose has been reduced in the interim due to orthostasis. She is doing better on 12.5 mg twice daily. ECG today shows sinus rhythm with evidence of previous anteroseptal infarct.  She report no angina, does have NYHA class 2-3 dyspnea. Also reports leg pain with ambulation. States this is new in the last several months.  Lexiscan Myoview from February of this year revealed large area of scar in the apical and anteroseptal distributions, LVEF 28%. Concurrently performed echocardiogram revealed LVEF 25-30% with grade 1 diastolic dysfunction, mild to moderate mitral regurgitation.  Previous lipid panel in February showed total cholesterol 189, triglycerides 144, HDL 36, LDL 124. No recent followup. She is not on statin therapy, had trouble tolerating Pravachol, Lipitor, and Zocor.   Allergies  Allergen Reactions  . Penicillins Anaphylaxis      shock  . Metoclopramide Hcl     Non responsive  . Metoprolol     Syncope   . Statins     Myalgias    Current Outpatient Prescriptions  Medication Sig Dispense Refill  . aspirin 81 MG tablet Take 81 mg by mouth daily.        . carvedilol (COREG) 12.5 MG tablet Take 12.5 mg by mouth 2 (two) times daily with a meal.      . citalopram (CELEXA) 20 MG tablet Take 40 mg by mouth daily.       . cyclobenzaprine (FLEXERIL) 10 MG tablet Take 10 mg by mouth 3 (three) times daily as needed.      . digoxin (LANOXIN) 0.25 MG tablet TAKE ONE TABLET BY MOUTH EVERY DAY  30 tablet  3  . famotidine (PEPCID) 20 MG tablet Take 20 mg by mouth 2 (two) times daily.        . fish oil-omega-3 fatty acids 1000 MG capsule Take 1 g by mouth daily.        . furosemide (LASIX) 20 MG tablet Take 20 mg by mouth daily. TAKE ONE TABLET ONE DAY AND ALTERNATE TWO TABLETS THE NEXT DAY      .  HYDROcodone-acetaminophen (NORCO) 10-325 MG per tablet Take 1 tablet by mouth every 6 (six) hours as needed. Take 1/2 tab bid        . ibuprofen (ADVIL,MOTRIN) 200 MG tablet Take 200 mg by mouth every 6 (six) hours as needed.        . isosorbide mononitrate (IMDUR) 30 MG 24 hr tablet TAKE ONE TABLET BY MOUTH EVERY DAY  90 tablet  1  . KLOR-CON M20 20 MEQ tablet TAKE ONE TABLET BY MOUTH EVERY DAY  90 tablet  0  . Multiple Vitamins-Minerals (MULTIVITAMINS THER. W/MINERALS) TABS Take 1 tablet by mouth daily.        Marland Kitchen NITROSTAT 0.4 MG SL tablet DISSOLVE ONE TABLET UNDER THE TONGUE EVERY 5 MINUTES AS NEEDED FOR CHEST PAIN.  DO NOT EXCEED A TOTAL OF 3 DOSES IN 15 MINUTES  25 each  6  . promethazine (PHENERGAN) 25 MG tablet Take 25 mg by mouth every 6 (six) hours as needed for nausea.      Marland Kitchen warfarin (COUMADIN) 10 MG tablet Take 1 tablet (10 mg total) by mouth daily.  45 tablet  3   No current facility-administered medications for this visit.    Past Medical History  Diagnosis Date  .  History of pneumonia   . Chronic systolic heart failure   . 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 Coronary atherosclerosis of native coronary artery     Severe two vessel disease   . Cocaine abuse in remission   . Raynaud's phenomenon   . Essential hypertension, benign   . Hyperlipidemia   . Mixed hyperlipidemia     LVEF 30-35%  . Anticardiolipin antibody positive     Chronic Coumadin    Past Surgical History  Procedure Laterality Date  . Cardiac defibrillator placement      St.Jude ICD  . Appendectomy    . Laryngeal polyp excision    . L1 corpectomy   12/10    Social History Ms. Nored reports that she has quit smoking. Her smoking use included Cigarettes. She smoked 0.00 packs per day. She has never used smokeless tobacco. Ms. Desta reports that she does not drink alcohol.  Review of Systems No device discharges, no palpitations. No syncope. Stable appetite.  Physical  Examination Filed Vitals:   10/01/13 1256  BP: 134/76  Pulse: 66   Filed Weights   10/01/13 1256  Weight: 211 lb (95.709 kg)   Appears comfortable. HEENT: Conjunctiva and lids are normal, oropharynx clear.  Neck: Supple no loud carotid bruits, very soft bruit on the right.  Lungs: Clear to auscultation without labored breathing.  Cardiac: Regular rate and rhythm, no S3 gallop. PMI indistinct. No loud systolic murmur.  Abdomen: Soft nontender, no hepatomegalyl. Bowel sounds present.  Extremities: Diminished dorsalis pedis pulses bilaterally. Mild pedal edema.  Musculoskeletal: No kyphosis noted.  Skin: Warm and dry.  Neuropsychiatric: Patient alert and oriented x3, affect is grossly normal.   Problem List and Plan   CARDIOMYOPATHY, ISCHEMIC LVEF 25-30%, no active ischemia by Myoview earlier this year. Plan is to continue medical therapy. Somewhat limited by orthostasis, in fact Coreg dose had to be reduced. She denies any palpitations or device discharges. I recommended that she continue to stay active as tolerated, a more regular walking regimen.  CLAUDICATION Will obtain lower extremity arterial studies to exclude significant obstructive arterial disease.  Essential hypertension, benign No change in current regimen.  HYPERLIPIDEMIA LDL was 124 in February. Recheck FLP and LFTs. Might be able to try low-dose Crestor as a next option.  ANTICARDIOLIPIN ANTIBODY SYNDROME Prior history of stroke, occluded right ICA, on chronic Coumadin.  Single implantable cardiac defibrillator in situ Keep followup with Dr. Ladona Ridgel.    [name], M.D., F.A.C.C.

## 2013-10-01 NOTE — Assessment & Plan Note (Signed)
Will obtain lower extremity arterial studies to exclude significant obstructive arterial disease.

## 2013-10-06 ENCOUNTER — Other Ambulatory Visit: Payer: Self-pay | Admitting: Cardiology

## 2013-10-06 DIAGNOSIS — I739 Peripheral vascular disease, unspecified: Secondary | ICD-10-CM

## 2013-10-08 ENCOUNTER — Ambulatory Visit (HOSPITAL_COMMUNITY): Payer: Self-pay

## 2013-10-11 ENCOUNTER — Ambulatory Visit (HOSPITAL_COMMUNITY)
Admission: RE | Admit: 2013-10-11 | Discharge: 2013-10-11 | Disposition: A | Discharge status: Home / Self Care | Payer: No Typology Code available for payment source | Source: Ambulatory Visit | Attending: Cardiology | Admitting: Cardiology

## 2013-10-11 ENCOUNTER — Other Ambulatory Visit: Payer: Self-pay | Admitting: Cardiology

## 2013-10-11 DIAGNOSIS — I739 Peripheral vascular disease, unspecified: Secondary | ICD-10-CM | POA: Insufficient documentation

## 2013-10-11 DIAGNOSIS — I10 Essential (primary) hypertension: Secondary | ICD-10-CM | POA: Insufficient documentation

## 2013-10-11 DIAGNOSIS — Z87891 Personal history of nicotine dependence: Secondary | ICD-10-CM | POA: Insufficient documentation

## 2013-10-14 ENCOUNTER — Encounter: Payer: Self-pay | Admitting: *Deleted

## 2013-10-14 LAB — HEPATIC FUNCTION PANEL
ALT: 32 U/L (ref 0–35)
AST: 23 U/L (ref 0–37)
Albumin: 3.8 g/dL (ref 3.5–5.2)
Alkaline Phosphatase: 72 U/L (ref 39–117)
Bilirubin, Direct: 0.1 mg/dL (ref 0.0–0.3)
Indirect Bilirubin: 0.2 mg/dL (ref 0.0–0.9)
Total Bilirubin: 0.3 mg/dL (ref 0.3–1.2)
Total Protein: 7.2 g/dL (ref 6.0–8.3)

## 2013-10-14 LAB — LIPID PANEL
Cholesterol: 162 mg/dL (ref 0–200)
HDL: 33 mg/dL — ABNORMAL LOW (ref >39)
LDL Cholesterol: 91 mg/dL (ref 0–99)
Total CHOL/HDL Ratio: 4.9 Ratio
Triglycerides: 189 mg/dL — ABNORMAL HIGH (ref <150)
VLDL: 38 mg/dL (ref 0–40)

## 2013-10-19 ENCOUNTER — Institutional Professional Consult (permissible substitution): Payer: No Typology Code available for payment source | Admitting: Cardiovascular Disease

## 2013-10-20 ENCOUNTER — Ambulatory Visit (INDEPENDENT_AMBULATORY_CARE_PROVIDER_SITE_OTHER): Payer: No Typology Code available for payment source | Admitting: *Deleted

## 2013-10-20 ENCOUNTER — Encounter: Payer: Self-pay | Admitting: *Deleted

## 2013-10-20 ENCOUNTER — Encounter: Payer: Self-pay | Admitting: Internal Medicine

## 2013-10-20 ENCOUNTER — Ambulatory Visit (INDEPENDENT_AMBULATORY_CARE_PROVIDER_SITE_OTHER): Payer: No Typology Code available for payment source | Admitting: Internal Medicine

## 2013-10-20 VITALS — BP 130/80 | HR 61 | Ht 66.0 in | Wt 210.0 lb

## 2013-10-20 DIAGNOSIS — I739 Peripheral vascular disease, unspecified: Secondary | ICD-10-CM

## 2013-10-20 DIAGNOSIS — R894 Abnormal immunological findings in specimens from other organs, systems and tissues: Secondary | ICD-10-CM

## 2013-10-20 DIAGNOSIS — Z95810 Presence of automatic (implantable) cardiac defibrillator: Secondary | ICD-10-CM

## 2013-10-20 DIAGNOSIS — I679 Cerebrovascular disease, unspecified: Secondary | ICD-10-CM

## 2013-10-20 DIAGNOSIS — Z7901 Long term (current) use of anticoagulants: Secondary | ICD-10-CM

## 2013-10-20 DIAGNOSIS — I2589 Other forms of chronic ischemic heart disease: Secondary | ICD-10-CM

## 2013-10-20 LAB — ICD DEVICE OBSERVATION
AL AMPLITUDE: 2.3 mv
AL IMPEDENCE ICD: 617 Ohm
AL THRESHOLD: 0.4 V
BATTERY VOLTAGE: 2.88 V
DEV-0020ICD: NEGATIVE
DEVICE MODEL ICD: 504498
HV IMPEDENCE: 46 Ohm
PACEART VT: 1
RV LEAD AMPLITUDE: 25 mv
RV LEAD IMPEDENCE ICD: 597 Ohm
RV LEAD THRESHOLD: 0.8 V
TZAT-0001FASTVT: 1
TZAT-0001FASTVT: 2
TZAT-0002FASTVT: NEGATIVE
TZAT-0013FASTVT: 4
TZAT-0018FASTVT: NEGATIVE
TZAT-0018FASTVT: NEGATIVE
TZON-0003FASTVT: 352.9 ms
TZST-0001FASTVT: 3
TZST-0001FASTVT: 4
TZST-0001FASTVT: 5
TZST-0001FASTVT: 6
TZST-0001FASTVT: 7
TZST-0003FASTVT: 31 J
TZST-0003FASTVT: 41 J
TZST-0003FASTVT: 41 J
TZST-0003FASTVT: 41 J
TZST-0003FASTVT: 6 J

## 2013-10-20 LAB — POCT INR: INR: 2.6

## 2013-10-20 NOTE — Assessment & Plan Note (Signed)
Her [employer] dual-chamber ICD is working normally. We'll plan to recheck in several months. 

## 2013-10-20 NOTE — Assessment & Plan Note (Signed)
She has evidence of peripheral vascular disease on ultrasound. She is pending followup with her vascular cardiologist.

## 2013-10-20 NOTE — Progress Notes (Signed)
HPI [name] [name] returns today for followup. She is a very pleasant 52 year old woman with an ischemic cardiomyopathy, chronic systolic heart failure, well compensated, status post ICD implantation. The patient has done well in the interim. She is gone back to work. She denies chest pain or shortness of breath except during periods of intense stress. She has had no ICD shocks. She denies peripheral edema. She has developed claudication, and has an abnormal Doppler study of her left leg. She has had been referred for additional evaluation to my partner Dr. Kennith Maes.  Allergies  Allergen Reactions  . Penicillins Anaphylaxis      shock  . Metoclopramide Hcl     Non responsive  . Metoprolol     Syncope   . Statins     Myalgias     Current Outpatient Prescriptions  Medication Sig Dispense Refill  . aspirin 81 MG tablet Take 81 mg by mouth daily.        . carvedilol (COREG) 12.5 MG tablet Take 12.5 mg by mouth 2 (two) times daily with a meal.      . citalopram (CELEXA) 20 MG tablet Take 40 mg by mouth daily.       . cyclobenzaprine (FLEXERIL) 10 MG tablet Take 10 mg by mouth 3 (three) times daily as needed.      . digoxin (LANOXIN) 0.25 MG tablet TAKE ONE TABLET BY MOUTH EVERY DAY  30 tablet  3  . famotidine (PEPCID) 20 MG tablet Take 20 mg by mouth 2 (two) times daily.        . fish oil-omega-3 fatty acids 1000 MG capsule Take 1 g by mouth daily.        . furosemide (LASIX) 20 MG tablet Take 20 mg by mouth daily. TAKE ONE TABLET ONE DAY AND ALTERNATE TWO TABLETS THE NEXT DAY      . HYDROcodone-acetaminophen (NORCO) 10-325 MG per tablet Take 1 tablet by mouth every 6 (six) hours as needed. Take 1/2 tab bid        . ibuprofen (ADVIL,MOTRIN) 200 MG tablet Take 200 mg by mouth every 6 (six) hours as needed.        . isosorbide mononitrate (IMDUR) 30 MG 24 hr tablet TAKE ONE TABLET BY MOUTH EVERY DAY  90 tablet  1  . KLOR-CON M20 20 MEQ tablet TAKE ONE TABLET BY MOUTH EVERY DAY  90 tablet  0  .  Multiple Vitamins-Minerals (MULTIVITAMINS THER. W/MINERALS) TABS Take 1 tablet by mouth daily.        Marland Kitchen NITROSTAT 0.4 MG SL tablet DISSOLVE ONE TABLET UNDER THE TONGUE EVERY 5 MINUTES AS NEEDED FOR CHEST PAIN.  DO NOT EXCEED A TOTAL OF 3 DOSES IN 15 MINUTES  25 each  6  . promethazine (PHENERGAN) 25 MG tablet Take 25 mg by mouth every 6 (six) hours as needed for nausea.      Marland Kitchen warfarin (COUMADIN) 10 MG tablet Take 1 tablet (10 mg total) by mouth daily.  45 tablet  3   No current facility-administered medications for this visit.     Past Medical History  Diagnosis Date  . History of pneumonia   . Chronic systolic heart failure   . 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 Coronary atherosclerosis of native coronary artery     Severe two vessel disease   . Cocaine abuse in remission   . Raynaud's phenomenon   . Essential hypertension, benign   .  Hyperlipidemia   . Mixed hyperlipidemia     LVEF 30-35%  . Anticardiolipin antibody positive     Chronic Coumadin    ROS:   All systems reviewed and negative except as noted in the HPI.   Past Surgical History  Procedure Laterality Date  . Cardiac defibrillator placement      St.Jude ICD  . Appendectomy    . Laryngeal polyp excision    . L1 corpectomy   12/10     History reviewed. No pertinent family history.   History   Social History  . Marital Status: Single    Spouse Name: N/A    Number of Children: N/A  . Years of Education: N/A   Occupational History  . 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 Smoking status: Former Smoker    Types: Cigarettes  . Smokeless tobacco: Never Used     Comment: 1 pack a week  . Alcohol Use: No  . Drug Use: No     Comment: Prior history of cocaine  . Sexual Activity: Not on file   Other Topics Concern  . Not on file   Social History Narrative  . No narrative on file     BP 130/80  Pulse 61  Ht 5\' 6"  (1.676 m)  Wt 210 lb (95.255 kg)   BMI 33.91 kg/m2  Physical Exam:  Well appearing middle-aged woman, NAD HEENT: Unremarkable Neck:  No JVD, no thyromegally Lungs:  Clear with no wheezes, rales, or rhonchi. Well-healed ICD incision. HEART:  Regular rate rhythm, no murmurs, no rubs, no clicks Abd:  soft, positive bowel sounds, no organomegally, no rebound, no guarding Ext:  2 plus pulses, no edema, no cyanosis, no clubbing Skin:  No rashes no nodules Neuro:  CN II through XII intact, motor grossly intact  DEVICE  Normal device function.  See PaceArt for details.   Assess/Plan:

## 2013-10-20 NOTE — Assessment & Plan Note (Signed)
For sleep she stop smoking over a year ago. She is on low-dose statin therapy.

## 2013-10-20 NOTE — Patient Instructions (Signed)
Your physician recommends that you schedule a follow-up appointment in: 1 year with Dr Ladona Ridgel and latitude transmission on Jan 22.

## 2013-10-21 ENCOUNTER — Encounter: Payer: Self-pay | Admitting: Internal Medicine

## 2013-10-29 ENCOUNTER — Other Ambulatory Visit: Payer: Self-pay

## 2013-10-29 MED ORDER — NITROGLYCERIN 0.4 MG SL SUBL: SUBLINGUAL_TABLET | SUBLINGUAL | Status: DC

## 2013-11-03 ENCOUNTER — Telehealth: Payer: Self-pay | Admitting: Cardiology

## 2013-11-03 NOTE — Telephone Encounter (Signed)
Received fax refill request ° °Rx # [id number] °Medication:  Nitrostat 0.4mg sub °Qty 25 °Sig:  Dissolve one tablet under the tongue every 5 minutes as needed for chest pain. Do not exceed a total of 3 doses in 15 minutes. °Physician:  [place] ° ° °

## 2013-11-05 ENCOUNTER — Telehealth: Payer: Self-pay | Admitting: Cardiology

## 2013-11-05 ENCOUNTER — Emergency Department (HOSPITAL_COMMUNITY)
Admission: EM | Admit: 2013-11-05 | Discharge: 2013-11-05 | Discharge status: Against Medical Advice | Payer: Medicaid Other | Attending: Emergency Medicine | Admitting: Emergency Medicine

## 2013-11-05 ENCOUNTER — Encounter (HOSPITAL_COMMUNITY): Payer: Self-pay | Admitting: Emergency Medicine

## 2013-11-05 DIAGNOSIS — I10 Essential (primary) hypertension: Secondary | ICD-10-CM | POA: Insufficient documentation

## 2013-11-05 DIAGNOSIS — Z87891 Personal history of nicotine dependence: Secondary | ICD-10-CM | POA: Insufficient documentation

## 2013-11-05 DIAGNOSIS — Z95810 Presence of automatic (implantable) cardiac defibrillator: Secondary | ICD-10-CM | POA: Insufficient documentation

## 2013-11-05 DIAGNOSIS — I2510 Atherosclerotic heart disease of native coronary artery without angina pectoris: Secondary | ICD-10-CM | POA: Insufficient documentation

## 2013-11-05 DIAGNOSIS — I5022 Chronic systolic (congestive) heart failure: Secondary | ICD-10-CM | POA: Insufficient documentation

## 2013-11-05 DIAGNOSIS — Z8673 Personal history of transient ischemic attack (TIA), and cerebral infarction without residual deficits: Secondary | ICD-10-CM | POA: Insufficient documentation

## 2013-11-05 DIAGNOSIS — R209 Unspecified disturbances of skin sensation: Secondary | ICD-10-CM | POA: Insufficient documentation

## 2013-11-05 MED ORDER — NITROGLYCERIN 0.4 MG SL SUBL: SUBLINGUAL_TABLET | SUBLINGUAL | Status: DC

## 2013-11-05 NOTE — Telephone Encounter (Signed)
rx sent to pharmacy by e-script  

## 2013-11-05 NOTE — Telephone Encounter (Signed)
Received fax refill request  Rx # [id number] Medication:  Nitrostat 0.4mg  sub Qty 25 Sig:  Dissolve one tablet under the tongue every 5 minutes as needed for chest pain. Do not exceed a total of 3 doses in 15 minutes. Physician:  Diona Browner

## 2013-11-05 NOTE — ED Notes (Addendum)
Numbness of arms and legs, onset today, only" Tingling" now.  Feels weak,  Recently found out she has blockages of femoral arteries.

## 2013-11-09 ENCOUNTER — Ambulatory Visit (INDEPENDENT_AMBULATORY_CARE_PROVIDER_SITE_OTHER): Payer: No Typology Code available for payment source | Admitting: Cardiovascular Disease

## 2013-11-09 ENCOUNTER — Encounter: Payer: Self-pay | Admitting: Cardiovascular Disease

## 2013-11-09 VITALS — BP 88/64 | HR 72 | Resp 14 | Ht 66.0 in | Wt 209.0 lb

## 2013-11-09 DIAGNOSIS — I739 Peripheral vascular disease, unspecified: Secondary | ICD-10-CM | POA: Insufficient documentation

## 2013-11-09 NOTE — Patient Instructions (Addendum)
Your physician has requested that you have an abdominal aorta duplex. During this test, an ultrasound is used to evaluate the aorta. Allow 30 minutes for this exam. Do not eat after midnight the day before and avoid carbonated beverages  Your physician has requested that you have a lower  extremity arterial duplex. This test is an ultrasound of the arteries in the legs . It looks at arterial blood flow in the legs . Allow one hour for Lower and Upper Arterial scans. There are no restrictions or special instructions   Your physician recommends that you schedule a follow-up appointment in: 3 MONTHS WITH DR Kirke Corin

## 2013-11-09 NOTE — Progress Notes (Signed)
Primary cardiologist: Dr. Diona Browner.  Electrophysiologist: Dr. Ladona Ridgel.  HPI  [name] [name] is a 52 year old female who is here today for evaluation and management of peripheral arterial disease. She has known history of ischemic cardiomyopathy with recent ejection fraction of 25-30%, chronic systolic heart failure, well compensated, status post ICD implantation. She has been relatively stable from a cardiac standpoint. She is a former smoker and is not diabetic. She does have antiphospholipid syndrome on life long anticoagulation with warfarin.  Biggest issue seems to be  lower extremity claudication. Recent ABI was normal on the right side and mildly reduced on the left side. She complains of bilateral thigh discomfort after walking about half a block. Left is worse than right. Symptoms are significantly worse if there is inclined. There is no rest pain. She does complain of a sensation of "pins and needles"in both feet. There is no lower extremity ulceration.  Allergies  Allergen Reactions  . Penicillins Anaphylaxis      shock  . Metoclopramide Hcl     Non responsive  . Metoprolol     Syncope   . Statins     Myalgias     Current Outpatient Prescriptions on File Prior to Visit  Medication Sig Dispense Refill  . aspirin 81 MG tablet Take 81 mg by mouth daily.        . carvedilol (COREG) 12.5 MG tablet Take 12.5 mg by mouth 2 (two) times daily with a meal.      . citalopram (CELEXA) 20 MG tablet Take 40 mg by mouth daily.       . cyclobenzaprine (FLEXERIL) 10 MG tablet Take 10 mg by mouth 3 (three) times daily as needed.      . digoxin (LANOXIN) 0.25 MG tablet TAKE ONE TABLET BY MOUTH EVERY DAY  30 tablet  3  . famotidine (PEPCID) 20 MG tablet Take 20 mg by mouth 2 (two) times daily.        . fish oil-omega-3 fatty acids 1000 MG capsule Take 1 g by mouth daily.        . furosemide (LASIX) 20 MG tablet Take 20 mg by mouth daily. TAKE ONE TABLET ONE DAY AND ALTERNATE TWO TABLETS THE NEXT  DAY      . HYDROcodone-acetaminophen (NORCO) 10-325 MG per tablet Take 1 tablet by mouth every 6 (six) hours as needed. Take 1/2 tab bid        . ibuprofen (ADVIL,MOTRIN) 200 MG tablet Take 200 mg by mouth every 6 (six) hours as needed.        . isosorbide mononitrate (IMDUR) 30 MG 24 hr tablet TAKE ONE TABLET BY MOUTH EVERY DAY  90 tablet  1  . KLOR-CON M20 20 MEQ tablet TAKE ONE TABLET BY MOUTH EVERY DAY  90 tablet  0  . Multiple Vitamins-Minerals (MULTIVITAMINS THER. W/MINERALS) TABS Take 1 tablet by mouth daily.        . nitroGLYCERIN (NITROSTAT) 0.4 MG SL tablet DISSOLVE ONE TABLET UNDER THE TONGUE EVERY 5 MINUTES AS NEEDED FOR CHEST PAIN.  DO NOT EXCEED A TOTAL OF 3 DOSES IN 15 MINUTES  25 tablet  6  . promethazine (PHENERGAN) 25 MG tablet Take 25 mg by mouth every 6 (six) hours as needed for nausea.      Marland Kitchen warfarin (COUMADIN) 10 MG tablet Take 1 tablet (10 mg total) by mouth daily.  45 tablet  3   No current facility-administered medications on file prior to visit.     Past  Medical History  Diagnosis Date  . History of pneumonia   . Chronic systolic heart failure   . 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 Coronary atherosclerosis of native coronary artery     Severe two vessel disease   . Cocaine abuse in remission   . Raynaud's phenomenon   . Essential hypertension, benign   . Hyperlipidemia   . Mixed hyperlipidemia     LVEF 30-35%  . Anticardiolipin antibody positive     Chronic Coumadin  . CHF (congestive heart failure)      Past Surgical History  Procedure Laterality Date  . Appendectomy    . Laryngeal polyp excision    . L1 corpectomy   12/10  . Cardiac defibrillator placement      St.Jude ICD     No family history on file.   History   Social History  . Marital Status: Single    Spouse Name: N/A    Number of Children: N/A  . Years of Education: N/A   Occupational History  . Part-time Runner, broadcasting/film/video     [employer]   Social History Main  Topics  . Smoking status: Former Smoker    Types: Cigarettes  . Smokeless tobacco: Never Used     Comment: 1 pack a week  . Alcohol Use: No  . Drug Use: No     Comment: Prior history of cocaine  . Sexual Activity: Not on file   Other Topics Concern  . Not on file   Social History Narrative  . No narrative on file      PHYSICAL EXAM   BP 88/64  Pulse 72  Resp 14  Ht 5\' 6"  (1.676 m)  Wt 209 lb (94.802 kg)  BMI 33.75 kg/m2 Constitutional: She is oriented to person, place, and time. She appears well-developed and well-nourished. No distress.  HENT: No nasal discharge.  Head: Normocephalic and atraumatic.  Eyes: Pupils are equal and round. No discharge.  Neck: Normal range of motion. Neck supple. No JVD present. No thyromegaly present.  Cardiovascular: Normal rate, regular rhythm, normal heart sounds. Exam reveals no gallop and no friction rub. No murmur heard.  Pulmonary/Chest: Effort normal and breath sounds normal. No stridor. No respiratory distress. She has no wheezes. She has no rales. She exhibits no tenderness.  Abdominal: Soft. Bowel sounds are normal. She exhibits no distension. There is no tenderness. There is no rebound and no guarding.  Musculoskeletal: Normal range of motion. She exhibits no edema and no tenderness.  Neurological: She is alert and oriented to person, place, and time. Coordination normal.  Skin: Skin is warm and dry. No rash noted. She is not diaphoretic. No erythema. No pallor.  Psychiatric: She has a normal mood and affect. Her behavior is normal. Judgment and thought content normal.  Vascular: Femoral pulses normal on the right side and mildly diminished on the left side. Distal pulses are mildly diminished bilaterally.    ASSESSMENT AND PLAN

## 2013-11-09 NOTE — Assessment & Plan Note (Signed)
The patient has severe lifestyle limiting claudication worse on the left side than the right side. She also has a lot of neuropathic discomfort. Symptoms are suggestive of aortoiliac disease. Recent ABI at [facility] was mildly reduced on the left side with possible inflow disease suggested by biphasic waveform the left common femoral artery. I recommend obtaining lower extremity arterial duplex and aortoiliac duplex to localize her disease. In the meanwhile, I advised him to start an exercise program to improve her claudication free distance. I will reevaluate her symptoms in 3 months. If there is no significant improvement, we will consider proceeding with angiography and possible endovascular intervention. She is not a candidate for treatment with Pletal due to congestive heart failure.

## 2013-11-14 ENCOUNTER — Other Ambulatory Visit: Payer: Self-pay | Admitting: Cardiology

## 2013-11-18 ENCOUNTER — Encounter: Payer: Self-pay | Admitting: *Deleted

## 2013-11-23 ENCOUNTER — Encounter (HOSPITAL_COMMUNITY): Payer: No Typology Code available for payment source

## 2013-11-24 ENCOUNTER — Encounter (INDEPENDENT_AMBULATORY_CARE_PROVIDER_SITE_OTHER): Payer: No Typology Code available for payment source

## 2013-11-24 DIAGNOSIS — I739 Peripheral vascular disease, unspecified: Secondary | ICD-10-CM

## 2013-11-24 DIAGNOSIS — I70219 Atherosclerosis of native arteries of extremities with intermittent claudication, unspecified extremity: Secondary | ICD-10-CM

## 2013-11-24 DIAGNOSIS — I700 Atherosclerosis of aorta: Secondary | ICD-10-CM

## 2013-11-29 ENCOUNTER — Telehealth: Payer: Self-pay | Admitting: Cardiovascular Disease

## 2013-11-29 NOTE — Telephone Encounter (Signed)
There is a borderline significant blockage in left external iliac artery. Not critical at this point. IF claudication does not improve with walking, I recommend that we do an angiogram. She is scheduled for a follow up in February. She can certainly come earlier if symptoms worsen.

## 2013-11-29 NOTE — Telephone Encounter (Signed)
I spoke with the pt and made her aware of results.  At this time the pt plans on trying to walk on the treadmill at the gym. The pt is going to try this for a month and if she continues to have symptoms then she will contact the office for earlier follow-up.

## 2013-11-29 NOTE — Telephone Encounter (Signed)
New problem:  Pt is asking for a call back with her recent test results.

## 2013-11-29 NOTE — Telephone Encounter (Signed)
I will route this message to Dr Kirke Corin to review the pt's test results and make further recommendations.

## 2013-12-08 ENCOUNTER — Encounter: Payer: Self-pay | Admitting: *Deleted

## 2013-12-16 ENCOUNTER — Encounter (INDEPENDENT_AMBULATORY_CARE_PROVIDER_SITE_OTHER): Payer: Self-pay

## 2013-12-16 ENCOUNTER — Ambulatory Visit (INDEPENDENT_AMBULATORY_CARE_PROVIDER_SITE_OTHER): Payer: No Typology Code available for payment source | Admitting: *Deleted

## 2013-12-16 DIAGNOSIS — I679 Cerebrovascular disease, unspecified: Secondary | ICD-10-CM

## 2013-12-16 DIAGNOSIS — R894 Abnormal immunological findings in specimens from other organs, systems and tissues: Secondary | ICD-10-CM

## 2013-12-16 DIAGNOSIS — Z7901 Long term (current) use of anticoagulants: Secondary | ICD-10-CM

## 2013-12-16 LAB — POCT INR: INR: 2.6

## 2013-12-31 ENCOUNTER — Encounter: Payer: Self-pay | Admitting: Cardiovascular Disease

## 2013-12-31 ENCOUNTER — Telehealth: Payer: Self-pay

## 2013-12-31 NOTE — Telephone Encounter (Signed)
This encounter was created in error - please disregard.

## 2013-12-31 NOTE — Telephone Encounter (Signed)
[name] [name] at 12/31/2013 3:55 PM     Status: Signed        New message  C/o throbbing in left leg and numbness in left toes. Problems urinating. This is since pt last office visit with dr. Kirke Corin   I spoke with the pt and she complains of throbbing pain in left leg and numbness in her left foot.  I scheduled the pt to see Dr Kirke Corin on 01/04/14.  The pt has pain medication for her back and she said this has helped her leg pain.

## 2013-12-31 NOTE — Telephone Encounter (Signed)
Pt wanted to let Dr Kirke Corin know she is waking up with her left leg "aching, throbbing" states she "woke up almost in tears". Not sure if this has to do with her blockage. Please call and advise.

## 2013-12-31 NOTE — Telephone Encounter (Signed)
New message      C/o throbbing in left leg and numbness in left toes. Problems urinating. This is since pt last office visit with dr. Kirke Corin

## 2013-12-31 NOTE — Telephone Encounter (Signed)
Patient is seen at [facility]

## 2013-12-31 NOTE — Telephone Encounter (Signed)
Patient seen at [facility] office. Informed her that if she wants to see Dr. Kirke Corin at [facility] location she would have to call them. Phone message routed to [employer].

## 2014-01-04 ENCOUNTER — Ambulatory Visit (INDEPENDENT_AMBULATORY_CARE_PROVIDER_SITE_OTHER): Payer: No Typology Code available for payment source | Admitting: Cardiovascular Disease

## 2014-01-04 ENCOUNTER — Encounter: Payer: Self-pay | Admitting: *Deleted

## 2014-01-04 ENCOUNTER — Encounter: Payer: Self-pay | Admitting: Cardiovascular Disease

## 2014-01-04 VITALS — BP 111/73 | HR 65 | Ht 66.0 in | Wt 213.4 lb

## 2014-01-04 DIAGNOSIS — I739 Peripheral vascular disease, unspecified: Secondary | ICD-10-CM

## 2014-01-04 NOTE — Patient Instructions (Addendum)
Your physician has requested that you have a peripheral vascular angiogram. This exam is performed at the hospital. During this exam IV contrast is used to look at arterial blood flow. Please review the information sheet given for details.  Peripheral Vascular Disease Peripheral Vascular Disease (PVD), also called Peripheral Arterial Disease (PAD), is a circulation problem caused by cholesterol (atherosclerotic plaque) deposits in the arteries. PVD commonly occurs in the lower extremities (legs) but it can occur in other areas of the body, such as your arms. The cholesterol buildup in the arteries reduces blood flow which can cause pain and other serious problems. The presence of PVD can place a person at risk for Coronary Artery Disease (CAD).  CAUSES  Causes of PVD can be many. It is usually associated with more than one risk factor such as:   High Cholesterol.  Smoking.  Diabetes.  Lack of exercise or inactivity.  High blood pressure (hypertension).  Obesity.  Family history. SYMPTOMS   When the lower extremities are affected, patients with PVD may experience:  Leg pain with exertion or physical activity. This is called INTERMITTENT CLAUDICATION. This may present as cramping or numbness with physical activity. The location of the pain is associated with the level of blockage. For example, blockage at the abdominal level (distal abdominal aorta) may result in buttock or hip pain. Lower leg arterial blockage may result in calf pain.  As PVD becomes more severe, pain can develop with less physical activity.  In people with severe PVD, leg pain may occur at rest.  Other PVD signs and symptoms:  Leg numbness or weakness.  Coldness in the affected leg or foot, especially when compared to the other leg.  A change in leg color.  Patients with significant PVD are more prone to ulcers or sores on toes, feet or legs. These may take longer to heal or may reoccur. The ulcers or sores can  become infected.  If signs and symptoms of PVD are ignored, gangrene may occur. This can result in the loss of toes or loss of an entire limb.  Not all leg pain is related to PVD. Other medical conditions can cause leg pain such as:  Blood clots (embolism) or Deep Vein Thrombosis.  Inflammation of the blood vessels (vasculitis).  Spinal stenosis. DIAGNOSIS  Diagnosis of PVD can involve several different types of tests. These can include:  Pulse Volume Recording Method (PVR). This test is simple, painless and does not involve the use of X-rays. PVR involves measuring and comparing the blood pressure in the arms and legs. An ABI (Ankle-Brachial Index) is calculated. The normal ratio of blood pressures is 1. As this number becomes smaller, it indicates more severe disease.  < 0.95  indicates significant narrowing in one or more leg vessels.  <0.8 there will usually be pain in the foot, leg or buttock with exercise.  <0.4 will usually have pain in the legs at rest.  <0.25  usually indicates limb threatening PVD.  Doppler detection of pulses in the legs. This test is painless and checks to see if you have a pulses in your legs/feet.  A dye or contrast material (a substance that highlights the blood vessels so they show up on x-ray) may be given to help your caregiver better see the arteries for the following tests. The dye is eliminated from your body by the kidney's. Your caregiver may order blood work to check your kidney function and other laboratory values before the following tests are performed:    Magnetic Resonance Angiography (MRA). An MRA is a picture study of the blood vessels and arteries. The MRA machine uses a large magnet to produce images of the blood vessels.  Computed Tomography Angiography (CTA). A CTA is a specialized x-ray that looks at how the blood flows in your blood vessels. An IV may be inserted into your arm so contrast dye can be injected.  Angiogram. Is a  procedure that uses x-rays to look at your blood vessels. This procedure is minimally invasive, meaning a small incision (cut) is made in your groin. A small tube (catheter) is then inserted into the artery of your groin. The catheter is guided to the blood vessel or artery your caregiver wants to examine. Contrast dye is injected into the catheter. X-rays are then taken of the blood vessel or artery. After the images are obtained, the catheter is taken out. TREATMENT  Treatment of PVD involves many interventions which may include:  Lifestyle changes:  Quitting smoking.  Exercise.  Following a low fat, low cholesterol diet.  Control of diabetes.  Foot care is very important to the PVD patient. Good foot care can help prevent infection.  Medication:  Cholesterol-lowering medicine.  Blood pressure medicine.  Anti-platelet drugs.  Certain medicines may reduce symptoms of Intermittent Claudication.  Interventional/Surgical options:  Angioplasty. An Angioplasty is a procedure that inflates a balloon in the blocked artery. This opens the blocked artery to improve blood flow.  Stent Implant. A wire mesh tube (stent) is placed in the artery. The stent expands and stays in place, allowing the artery to remain open.  Peripheral Bypass Surgery. This is a surgical procedure that reroutes the blood around a blocked artery to help improve blood flow. This type of procedure may be performed if Angioplasty or stent implants are not an option. SEEK IMMEDIATE MEDICAL CARE IF:   You develop pain or numbness in your arms or legs.  Your arm or leg turns cold, becomes blue in color.  You develop redness, warmth, swelling and pain in your arms or legs. MAKE SURE YOU:   Understand these instructions.  Will watch your condition.  Will get help right away if you are not doing well or get worse. Document Released: 01/23/2005 Document Revised: 03/09/2012 Document Reviewed: 12/20/2008 [employer]  Patient Information 2014 [employer].  

## 2014-01-04 NOTE — Progress Notes (Signed)
Primary cardiologist: Dr. Diona BrownerMcDowell.  Electrophysiologist: Dr. Ladona Ridgelaylor.  HPI  [name] is a 53 year old female who is here today for a followup visit regarding  peripheral arterial disease. She has known history of ischemic cardiomyopathy with recent ejection fraction of 25-30%, chronic systolic heart failure, well compensated, status post ICD implantation. She has been relatively stable from a cardiac standpoint. She is a former smoker and is not diabetic. She does have antiphospholipid syndrome on life long anticoagulation with warfarin.  She was seen recently for left leg claudication with mildly reduced ABI. ABI on the left side was 0.81. Aortoiliac duplex showed moderate diffuse disease extending from the left common iliac to the external iliac artery. She reports worsening symptoms of left buttock and thigh claudication which is significantly limiting her ability to exercise and stay active. She is also suspected of having neuropathic pain.    Allergies  Allergen Reactions  . Penicillins Anaphylaxis      shock  . Metoclopramide Hcl     Non responsive  . Metoprolol     Syncope   . Statins     Myalgias     Current Outpatient Prescriptions on File Prior to Visit  Medication Sig Dispense Refill  . aspirin 81 MG tablet Take 81 mg by mouth daily.        . carvedilol (COREG) 12.5 MG tablet Take 12.5 mg by mouth 2 (two) times daily with a meal.      . citalopram (CELEXA) 20 MG tablet Take 40 mg by mouth daily.       . cyclobenzaprine (FLEXERIL) 10 MG tablet Take 10 mg by mouth 3 (three) times daily as needed.      . digoxin (LANOXIN) 0.25 MG tablet TAKE ONE TABLET BY MOUTH EVERY DAY  30 tablet  3  . famotidine (PEPCID) 20 MG tablet Take 20 mg by mouth 2 (two) times daily.        . fish oil-omega-3 fatty acids 1000 MG capsule Take 1 g by mouth daily.        . furosemide (LASIX) 20 MG tablet Take 20 mg by mouth daily. TAKE ONE TABLET ONE DAY AND ALTERNATE TWO TABLETS THE NEXT DAY       . HYDROcodone-acetaminophen (NORCO) 10-325 MG per tablet Take 1 tablet by mouth every 6 (six) hours as needed. Take 1/2 tab bid        . ibuprofen (ADVIL,MOTRIN) 200 MG tablet Take 200 mg by mouth every 6 (six) hours as needed.        . isosorbide mononitrate (IMDUR) 30 MG 24 hr tablet TAKE ONE TABLET BY MOUTH EVERY DAY  90 tablet  1  . KLOR-CON M20 20 MEQ tablet TAKE ONE TABLET BY MOUTH ONCE DAILY  90 tablet  0  . Multiple Vitamins-Minerals (MULTIVITAMINS THER. W/MINERALS) TABS Take 1 tablet by mouth daily.        . nitroGLYCERIN (NITROSTAT) 0.4 MG SL tablet DISSOLVE ONE TABLET UNDER THE TONGUE EVERY 5 MINUTES AS NEEDED FOR CHEST PAIN.  DO NOT EXCEED A TOTAL OF 3 DOSES IN 15 MINUTES  25 tablet  6  . promethazine (PHENERGAN) 25 MG tablet Take 25 mg by mouth every 6 (six) hours as needed for nausea.      Marland Kitchen. warfarin (COUMADIN) 10 MG tablet Take 1 tablet (10 mg total) by mouth daily.  45 tablet  3   No current facility-administered medications on file prior to visit.     Past Medical History  Diagnosis Date  . History of pneumonia   . Chronic systolic heart failure   . 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 Coronary atherosclerosis of native coronary artery     Severe two vessel disease   . Cocaine abuse in remission   . Raynaud's phenomenon   . Essential hypertension, benign   . Hyperlipidemia   . Mixed hyperlipidemia     LVEF 30-35%  . Anticardiolipin antibody positive     Chronic Coumadin  . CHF (congestive heart failure)      Past Surgical History  Procedure Laterality Date  . Appendectomy    . Laryngeal polyp excision    . L1 corpectomy   12/10  . Cardiac defibrillator placement      St.Jude ICD     No family history on file.   History   Social History  . Marital Status: Single    Spouse Name: N/A    Number of Children: N/A  . Years of Education: N/A   Occupational History  . 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  Smoking status: Former Smoker    Types: Cigarettes  . Smokeless tobacco: Never Used     Comment: 1 pack a week  . Alcohol Use: No  . Drug Use: No     Comment: Prior history of cocaine  . Sexual Activity: Not on file   Other Topics Concern  . Not on file   Social History Narrative  . No narrative on file      PHYSICAL EXAM   BP 111/73  Pulse 65  Ht 5\' 6"  (1.676 m)  Wt 213 lb 6.4 oz (96.798 kg)  BMI 34.46 kg/m2 Constitutional: She is oriented to person, place, and time. She appears well-developed and well-nourished. No distress.  HENT: No nasal discharge.  Head: Normocephalic and atraumatic.  Eyes: Pupils are equal and round. No discharge.  Neck: Normal range of motion. Neck supple. No JVD present. No thyromegaly present.  Cardiovascular: Normal rate, regular rhythm, normal heart sounds. Exam reveals no gallop and no friction rub. No murmur heard.  Pulmonary/Chest: Effort normal and breath sounds normal. No stridor. No respiratory distress. She has no wheezes. She has no rales. She exhibits no tenderness.  Abdominal: Soft. Bowel sounds are normal. She exhibits no distension. There is no tenderness. There is no rebound and no guarding.  Musculoskeletal: Normal range of motion. She exhibits no edema and no tenderness.  Neurological: She is alert and oriented to person, place, and time. Coordination normal.  Skin: Skin is warm and dry. No rash noted. She is not diaphoretic. No erythema. No pallor.  Psychiatric: She has a normal mood and affect. Her behavior is normal. Judgment and thought content normal.  Vascular: Femoral pulses normal on the right side and mildly diminished on the left side. Distal pulses are mildly diminished bilaterally.    ASSESSMENT AND PLAN

## 2014-01-05 ENCOUNTER — Telehealth: Payer: Self-pay | Admitting: *Deleted

## 2014-01-07 ENCOUNTER — Telehealth: Payer: Self-pay | Admitting: Cardiology

## 2014-01-07 ENCOUNTER — Telehealth: Payer: Self-pay | Admitting: Cardiovascular Disease

## 2014-01-07 ENCOUNTER — Encounter (HOSPITAL_COMMUNITY): Payer: Self-pay | Admitting: Pharmacy Technician

## 2014-01-07 NOTE — Telephone Encounter (Signed)
01/19/14 PT IS HAVING A BLOCKAGE IN ABDOMEN STENTED. NEEDS TO KNOW WHEN TO COME OFF COUMADIN AND START LOVENOX. SHE WAS TOLD THAT DR ARIDA OFFICE WOULD BE CONTACTING us TO GET HER SET UP FOR THIS. SHE IS CALLING TO MAKE SURE WE ARE AWARE OF IT.

## 2014-01-07 NOTE — Telephone Encounter (Signed)
I spoke with the pt and advised her that at this time she should keep her appt with the coumadin clinic next week.  I made her aware that she may be able to get samples of Lovenox if they are available.

## 2014-01-07 NOTE — Assessment & Plan Note (Signed)
The patient has severe lifestyle limiting claudication worse on the left side than the right side. She also has a lot of neuropathic discomfort. A walking exercise program was attempted but has not been successful due to limitations with symptoms. She is also not a candidate for treatment with cilostazol due to congestive heart failure. After extensive discussion of the options, we decided to schedule an abdominal aortogram, lower extremity runoff and possible angioplasty. This, benefits and alternatives were discussed. She would need bridging with low molecular weight heparin. Warfarin will be stopped 5 days before the procedure.

## 2014-01-07 NOTE — Telephone Encounter (Signed)
Appt made on 01/11/14 for pt to come in for Lovenox bridging prior to Abdominal Aortagram on 01/19/14 by Dr Kirke Corin at [facility].

## 2014-01-07 NOTE — Telephone Encounter (Signed)
New message     Pt states that lovenox cost too much--ins will not pay much.  Therefore, she need to cancel her procedure for a cath because she was told that she needed to have the lovenox before the procedure.

## 2014-01-11 ENCOUNTER — Ambulatory Visit: Payer: Self-pay | Admitting: *Deleted

## 2014-01-11 ENCOUNTER — Ambulatory Visit (INDEPENDENT_AMBULATORY_CARE_PROVIDER_SITE_OTHER): Payer: No Typology Code available for payment source | Admitting: *Deleted

## 2014-01-11 DIAGNOSIS — R894 Abnormal immunological findings in specimens from other organs, systems and tissues: Secondary | ICD-10-CM

## 2014-01-11 DIAGNOSIS — Z7901 Long term (current) use of anticoagulants: Secondary | ICD-10-CM

## 2014-01-11 DIAGNOSIS — I679 Cerebrovascular disease, unspecified: Secondary | ICD-10-CM

## 2014-01-11 LAB — POCT INR: INR: 2.1

## 2014-01-17 ENCOUNTER — Telehealth: Payer: Self-pay | Admitting: Cardiovascular Disease

## 2014-01-17 NOTE — Telephone Encounter (Addendum)
Called stating she needs to cancel her cath on 1/21 because she hasn't been able to get her Lovenox.  Has applied for pt assist but probably won't get approved until next week.  She will call back to reschedule. Advised will forward to Dr. Kirke Corin and cancel at hospital. Notified [facility] cath lab.

## 2014-01-17 NOTE — Telephone Encounter (Signed)
New message    Patient calling wants to cancel upcoming procedure with Dr. Kirke Corin.

## 2014-01-19 ENCOUNTER — Ambulatory Visit (HOSPITAL_COMMUNITY)
Admission: RE | Admit: 2014-01-19 | Payer: No Typology Code available for payment source | Source: Ambulatory Visit | Admitting: Cardiovascular Disease

## 2014-01-19 ENCOUNTER — Encounter (HOSPITAL_COMMUNITY): Admission: RE | Payer: Self-pay | Source: Ambulatory Visit

## 2014-01-19 SURGERY — ABDOMINAL AORTAGRAM
Anesthesia: LOCAL

## 2014-01-20 ENCOUNTER — Ambulatory Visit (INDEPENDENT_AMBULATORY_CARE_PROVIDER_SITE_OTHER): Payer: No Typology Code available for payment source | Admitting: *Deleted

## 2014-01-20 DIAGNOSIS — I428 Other cardiomyopathies: Secondary | ICD-10-CM

## 2014-01-20 LAB — MDC_IDC_ENUM_SESS_TYPE_REMOTE
Battery Voltage: 2.88 V
Brady Statistic RA Percent Paced: 0 %
Brady Statistic RV Percent Paced: 1 %
HighPow Impedance: 44 Ohm
Implantable Pulse Generator Serial Number: 504498
Lead Channel Impedance Value: 474 Ohm
Lead Channel Impedance Value: 506 Ohm
Lead Channel Sensing Intrinsic Amplitude: 2.4 mV
Lead Channel Sensing Intrinsic Amplitude: 21.4 mV
Lead Channel Setting Pacing Amplitude: 2 V
Lead Channel Setting Pacing Amplitude: 2.4 V
Lead Channel Setting Pacing Pulse Width: 0.5 ms
Zone Setting Detection Interval: 300 ms
Zone Setting Detection Interval: 352.9 ms

## 2014-01-25 ENCOUNTER — Encounter: Payer: Self-pay | Admitting: *Deleted

## 2014-01-27 ENCOUNTER — Encounter: Payer: Self-pay | Admitting: Internal Medicine

## 2014-02-02 ENCOUNTER — Ambulatory Visit (INDEPENDENT_AMBULATORY_CARE_PROVIDER_SITE_OTHER): Payer: No Typology Code available for payment source | Admitting: *Deleted

## 2014-02-02 DIAGNOSIS — I679 Cerebrovascular disease, unspecified: Secondary | ICD-10-CM

## 2014-02-02 DIAGNOSIS — Z5181 Encounter for therapeutic drug level monitoring: Secondary | ICD-10-CM | POA: Insufficient documentation

## 2014-02-02 DIAGNOSIS — Z7901 Long term (current) use of anticoagulants: Secondary | ICD-10-CM

## 2014-02-02 DIAGNOSIS — R894 Abnormal immunological findings in specimens from other organs, systems and tissues: Secondary | ICD-10-CM

## 2014-02-02 LAB — POCT INR: INR: 2.7

## 2014-02-05 ENCOUNTER — Other Ambulatory Visit: Payer: Self-pay | Admitting: Cardiology

## 2014-02-10 ENCOUNTER — Ambulatory Visit (INDEPENDENT_AMBULATORY_CARE_PROVIDER_SITE_OTHER): Payer: No Typology Code available for payment source | Admitting: Cardiovascular Disease

## 2014-02-10 ENCOUNTER — Encounter: Payer: Self-pay | Admitting: Cardiovascular Disease

## 2014-02-10 VITALS — BP 142/76 | HR 68 | Ht 67.0 in | Wt 213.5 lb

## 2014-02-10 DIAGNOSIS — R079 Chest pain, unspecified: Secondary | ICD-10-CM

## 2014-02-10 DIAGNOSIS — I739 Peripheral vascular disease, unspecified: Secondary | ICD-10-CM

## 2014-02-10 DIAGNOSIS — I2589 Other forms of chronic ischemic heart disease: Secondary | ICD-10-CM

## 2014-02-10 NOTE — Patient Instructions (Signed)
Your physician recommends that you continue on your current medications as directed. Please refer to the Current Medication list given to you today.   Call once Lovenox is approved.

## 2014-02-10 NOTE — Progress Notes (Signed)
Primary cardiologist: Dr. Diona BrownerMcDowell.  Electrophysiologist: Dr. Ladona Ridgelaylor.  HPI  [name] is a 53 year old female who is here today for a followup visit regarding  peripheral arterial disease. She has known history of ischemic cardiomyopathy with recent ejection fraction of 25-30%, chronic systolic heart failure, well compensated, status post ICD implantation. She has been relatively stable from a cardiac standpoint. She is a former smoker and is not diabetic. She does have antiphospholipid syndrome on life long anticoagulation with warfarin.  She was seen recently for left leg claudication with mildly reduced ABI. ABI on the left side was 0.81. Aortoiliac duplex showed moderate diffuse disease extending from the left common iliac to the external iliac artery. She reported worsening symptoms of left buttock and thigh claudication . Thus, the plan was to proceed with abdominal aortogram with lower extremity runoff and possible angioplasty. However, she requires bridging with Lovenox and could not afford this medication. Symptoms are still the same. She continues to have chronic back pain which is likely not related to her iliac  disease.   Allergies  Allergen Reactions  . Penicillins Anaphylaxis      shock  . Metoclopramide Hcl     Non responsive  . Metoprolol     Syncope   . Statins     Myalgias     Current Outpatient Prescriptions on File Prior to Visit  Medication Sig Dispense Refill  . aspirin 81 MG tablet Take 81 mg by mouth daily.        . carvedilol (COREG) 12.5 MG tablet Take 12.5 mg by mouth 2 (two) times daily with a meal.      . citalopram (CELEXA) 20 MG tablet Take 20 mg by mouth daily.       . cyclobenzaprine (FLEXERIL) 10 MG tablet Take 10 mg by mouth 3 (three) times daily as needed for muscle spasms.       . digoxin (LANOXIN) 0.25 MG tablet Take 0.25 mg by mouth daily.      . famotidine (PEPCID) 20 MG tablet Take 20 mg by mouth 2 (two) times daily.        . fish  oil-omega-3 fatty acids 1000 MG capsule Take 1 g by mouth daily.        . furosemide (LASIX) 20 MG tablet Take 10-20 mg by mouth daily. TAKE ONE HALF TABLET ONE DAY AND ALTERNATE ONE TABLETS THE NEXT DAY      . HYDROcodone-acetaminophen (NORCO) 10-325 MG per tablet Take 0.5 tablets by mouth 2 (two) times daily as needed for severe pain. Take 1/2 tab bid      . ibuprofen (ADVIL,MOTRIN) 200 MG tablet Take 200 mg by mouth every 6 (six) hours as needed (PAIN).       Marland Kitchen. isosorbide mononitrate (IMDUR) 30 MG 24 hr tablet Take 30 mg by mouth daily.      . Multiple Vitamins-Minerals (MULTIVITAMINS THER. W/MINERALS) TABS Take 1 tablet by mouth daily.        . nitroGLYCERIN (NITROSTAT) 0.4 MG SL tablet Place 0.4 mg under the tongue every 5 (five) minutes as needed for chest pain.      . potassium chloride SA (K-DUR,KLOR-CON) 20 MEQ tablet Take 20 mEq by mouth daily.      . promethazine (PHENERGAN) 25 MG tablet Take 25 mg by mouth every 6 (six) hours as needed for nausea.      Marland Kitchen. warfarin (COUMADIN) 10 MG tablet Take 10 mg by mouth daily. Take 1 tablet on Mon, Wed,  Fri, and Sat.  Take 1/2 tablet on Sun, Tues, Thurs.       No current facility-administered medications on file prior to visit.     Past Medical History  Diagnosis Date  . History of pneumonia   . Chronic systolic heart failure   . 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 Coronary atherosclerosis of native coronary artery     Severe two vessel disease   . Cocaine abuse in remission   . Raynaud's phenomenon   . Essential hypertension, benign   . Hyperlipidemia   . Mixed hyperlipidemia     LVEF 30-35%  . Anticardiolipin antibody positive     Chronic Coumadin  . CHF (congestive heart failure)   . PVD (peripheral vascular disease)      Past Surgical History  Procedure Laterality Date  . Appendectomy    . Laryngeal polyp excision    . L1 corpectomy   12/10  . Cardiac defibrillator placement      St.Jude ICD      Family History  Problem Relation Age of Onset  . Family history unknown: Yes     History   Social History  . Marital Status: Single    Spouse Name: N/A    Number of Children: N/A  . Years of Education: N/A   Occupational History  . 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 Smoking status: Former Smoker    Types: Cigarettes  . Smokeless tobacco: Never Used     Comment: 1 pack a week  . Alcohol Use: No  . Drug Use: No     Comment: Prior history of cocaine  . Sexual Activity: Not on file   Other Topics Concern  . Not on file   Social History Narrative  . No narrative on file      PHYSICAL EXAM   BP 142/76  Pulse 68  Ht 5\' 7"  (1.702 m)  Wt 213 lb 8 oz (96.843 kg)  BMI 33.43 kg/m2 Constitutional: She is oriented to person, place, and time. She appears well-developed and well-nourished. No distress.  HENT: No nasal discharge.  Head: Normocephalic and atraumatic.  Eyes: Pupils are equal and round. No discharge.  Neck: Normal range of motion. Neck supple. No JVD present. No thyromegaly present.  Cardiovascular: Normal rate, regular rhythm, normal heart sounds. Exam reveals no gallop and no friction rub. No murmur heard.  Pulmonary/Chest: Effort normal and breath sounds normal. No stridor. No respiratory distress. She has no wheezes. She has no rales. She exhibits no tenderness.  Abdominal: Soft. Bowel sounds are normal. She exhibits no distension. There is no tenderness. There is no rebound and no guarding.  Musculoskeletal: Normal range of motion. She exhibits no edema and no tenderness.  Neurological: She is alert and oriented to person, place, and time. Coordination normal.  Skin: Skin is warm and dry. No rash noted. She is not diaphoretic. No erythema. No pallor.  Psychiatric: She has a normal mood and affect. Her behavior is normal. Judgment and thought content normal.  Vascular: Femoral pulses normal on the right side and mildly  diminished on the left side. Distal pulses are mildly diminished bilaterally.  EKG: Normal sinus rhythm with ST and T wave changes in the inferior leads suggestive of ischemia.  ASSESSMENT AND PLAN

## 2014-02-11 NOTE — Assessment & Plan Note (Signed)
She continues to have significant left-sided claudication likely due to known iliac disease. She also has chronic back pain which seems to be in related to peripheral arterial disease. Continue medical therapy for now. Proceed with angiography once she is able to obtain Lovenox.

## 2014-02-11 NOTE — Assessment & Plan Note (Signed)
She appears to be euvolemic on current dose of Lasix. 

## 2014-02-15 ENCOUNTER — Ambulatory Visit: Payer: No Typology Code available for payment source | Admitting: Cardiovascular Disease

## 2014-02-25 ENCOUNTER — Encounter: Payer: No Typology Code available for payment source | Admitting: Cardiology

## 2014-02-25 ENCOUNTER — Encounter: Payer: Self-pay | Admitting: Cardiology

## 2014-02-25 NOTE — Progress Notes (Signed)
Patient canceled.  This encounter was created in error - please disregard. 

## 2014-03-01 ENCOUNTER — Encounter (HOSPITAL_COMMUNITY): Payer: Self-pay | Admitting: Emergency Medicine

## 2014-03-01 ENCOUNTER — Emergency Department (HOSPITAL_COMMUNITY): Payer: Medicaid Other

## 2014-03-01 ENCOUNTER — Emergency Department (HOSPITAL_COMMUNITY)
Admission: EM | Admit: 2014-03-01 | Discharge: 2014-03-01 | Disposition: A | Discharge status: Home / Self Care | Payer: Medicaid Other | Attending: Emergency Medicine | Admitting: Emergency Medicine

## 2014-03-01 DIAGNOSIS — Z7982 Long term (current) use of aspirin: Secondary | ICD-10-CM | POA: Insufficient documentation

## 2014-03-01 DIAGNOSIS — S298XXA Other specified injuries of thorax, initial encounter: Secondary | ICD-10-CM | POA: Insufficient documentation

## 2014-03-01 DIAGNOSIS — R079 Chest pain, unspecified: Secondary | ICD-10-CM

## 2014-03-01 DIAGNOSIS — R011 Cardiac murmur, unspecified: Secondary | ICD-10-CM | POA: Insufficient documentation

## 2014-03-01 DIAGNOSIS — Z8639 Personal history of other endocrine, nutritional and metabolic disease: Secondary | ICD-10-CM | POA: Insufficient documentation

## 2014-03-01 DIAGNOSIS — Y929 Unspecified place or not applicable: Secondary | ICD-10-CM | POA: Insufficient documentation

## 2014-03-01 DIAGNOSIS — R209 Unspecified disturbances of skin sensation: Secondary | ICD-10-CM | POA: Insufficient documentation

## 2014-03-01 DIAGNOSIS — Y9389 Activity, other specified: Secondary | ICD-10-CM | POA: Insufficient documentation

## 2014-03-01 DIAGNOSIS — Z791 Long term (current) use of non-steroidal anti-inflammatories (NSAID): Secondary | ICD-10-CM | POA: Insufficient documentation

## 2014-03-01 DIAGNOSIS — W010XXA Fall on same level from slipping, tripping and stumbling without subsequent striking against object, initial encounter: Secondary | ICD-10-CM | POA: Insufficient documentation

## 2014-03-01 DIAGNOSIS — I2510 Atherosclerotic heart disease of native coronary artery without angina pectoris: Secondary | ICD-10-CM | POA: Insufficient documentation

## 2014-03-01 DIAGNOSIS — I5022 Chronic systolic (congestive) heart failure: Secondary | ICD-10-CM | POA: Insufficient documentation

## 2014-03-01 DIAGNOSIS — Z8673 Personal history of transient ischemic attack (TIA), and cerebral infarction without residual deficits: Secondary | ICD-10-CM | POA: Insufficient documentation

## 2014-03-01 DIAGNOSIS — Z95810 Presence of automatic (implantable) cardiac defibrillator: Secondary | ICD-10-CM | POA: Insufficient documentation

## 2014-03-01 DIAGNOSIS — Z8669 Personal history of other diseases of the nervous system and sense organs: Secondary | ICD-10-CM | POA: Insufficient documentation

## 2014-03-01 DIAGNOSIS — Z8701 Personal history of pneumonia (recurrent): Secondary | ICD-10-CM | POA: Insufficient documentation

## 2014-03-01 DIAGNOSIS — Z880 Allergy status to penicillin: Secondary | ICD-10-CM | POA: Insufficient documentation

## 2014-03-01 DIAGNOSIS — Z7901 Long term (current) use of anticoagulants: Secondary | ICD-10-CM | POA: Insufficient documentation

## 2014-03-01 DIAGNOSIS — I10 Essential (primary) hypertension: Secondary | ICD-10-CM | POA: Insufficient documentation

## 2014-03-01 DIAGNOSIS — Z79899 Other long term (current) drug therapy: Secondary | ICD-10-CM | POA: Insufficient documentation

## 2014-03-01 DIAGNOSIS — W19XXXA Unspecified fall, initial encounter: Secondary | ICD-10-CM

## 2014-03-01 DIAGNOSIS — Z87891 Personal history of nicotine dependence: Secondary | ICD-10-CM | POA: Insufficient documentation

## 2014-03-01 DIAGNOSIS — Z862 Personal history of diseases of the blood and blood-forming organs and certain disorders involving the immune mechanism: Secondary | ICD-10-CM | POA: Insufficient documentation

## 2014-03-01 LAB — CBC
HCT: 43 % (ref 36.0–46.0)
Hemoglobin: 14 g/dL (ref 12.0–15.0)
MCH: 28.9 pg (ref 26.0–34.0)
MCHC: 32.6 g/dL (ref 30.0–36.0)
MCV: 88.8 fL (ref 78.0–100.0)
Platelets: 153 10*3/uL (ref 150–400)
RBC: 4.84 MIL/uL (ref 3.87–5.11)
RDW: 12.6 % (ref 11.5–15.5)
WBC: 7.1 10*3/uL (ref 4.0–10.5)

## 2014-03-01 LAB — COMPREHENSIVE METABOLIC PANEL
ALT: 26 U/L (ref 0–35)
AST: 23 U/L (ref 0–37)
Albumin: 3.6 g/dL (ref 3.5–5.2)
Alkaline Phosphatase: 83 U/L (ref 39–117)
BUN: 9 mg/dL (ref 6–23)
CO2: 32 mEq/L (ref 19–32)
Calcium: 9.2 mg/dL (ref 8.4–10.5)
Chloride: 102 mEq/L (ref 96–112)
Creatinine, Ser: 0.76 mg/dL (ref 0.50–1.10)
GFR calc Af Amer: >90 mL/min (ref >90)
GFR calc non Af Amer: >90 mL/min (ref >90)
Glucose, Bld: 66 mg/dL — ABNORMAL LOW (ref 70–99)
Potassium: 4.8 mEq/L (ref 3.7–5.3)
Sodium: 142 mEq/L (ref 137–147)
Total Bilirubin: 0.2 mg/dL — ABNORMAL LOW (ref 0.3–1.2)
Total Protein: 8 g/dL (ref 6.0–8.3)

## 2014-03-01 LAB — PROTIME-INR
INR: 2.21 — ABNORMAL HIGH (ref 0.00–1.49)
Prothrombin Time: 23.8 seconds — ABNORMAL HIGH (ref 11.6–15.2)

## 2014-03-01 LAB — PRO B NATRIURETIC PEPTIDE: Pro B Natriuretic peptide (BNP): 914.5 pg/mL — ABNORMAL HIGH (ref 0–125)

## 2014-03-01 MED ORDER — ASPIRIN 81 MG PO CHEW: 324.0000 mg | CHEWABLE_TABLET | Freq: Once | ORAL | Status: DC

## 2014-03-01 NOTE — ED Notes (Addendum)
Chest pain, onset today, "bubbles in abd"  Took ntg x2 with pain relief. No n/v , Has a headache.  Fell on ice on Sunday, and twisted her back.  Feet are numb

## 2014-03-01 NOTE — Discharge Instructions (Signed)
As discussed, your evaluation today has been largely reassuring.  But, it is important that you monitor your condition carefully, and do not hesitate to return to the ED if you develop new, or concerning changes in your condition. ? ?Otherwise, please follow-up with your physician for appropriate ongoing care. ? ?

## 2014-03-01 NOTE — ED Provider Notes (Signed)
CSN: [id number]     Arrival date & time 03/01/14  1309 History  This chart was scribed for [name] Munch, MD by Dorothey Baseman, ED Scribe. This patient was seen in room APA15/[facility]15 and the patient's care was started at 1:43 PM.    Chief Complaint  Patient presents with  . Chest Pain   The history is provided by the patient. No language interpreter was used.   HPI Comments: [name] [name] is a 53 y.o. female with a history of chronic systolic heart failure, coronary atherosclerosis, HTN, hyperlipidemia, CHF, PVD, and L1 corpectomy who presents to the Emergency Department complaining of a constant pain to the chest and left side of the mid-back onset 2 days ago after she reports that she slipped on ice, causing her body to twist. She reports a constant, throbbing pain to the left leg secondary to the incident. Patient reports a history of chronic pain to the bilateral knees that she states is unchanged since the incident. Patient also reports some associated numbness to the bilateral feet that she states has been ongoing for the past 3-4 months. She reports taking Norco at home with mild, temporary relief. Patient takes 10 mg coumadin daily. Patient also has a history of anticardiolipin antibody positive.   Past Medical History  Diagnosis Date  . History of pneumonia   . Chronic systolic heart failure   . 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 Coronary atherosclerosis of native coronary artery     Severe two vessel disease   . Cocaine abuse in remission   . Raynaud's phenomenon   . Essential hypertension, benign   . Hyperlipidemia   . Mixed hyperlipidemia     LVEF 30-35%  . Anticardiolipin antibody positive     Chronic Coumadin  . CHF (congestive heart failure)   . PVD (peripheral vascular disease)    Past Surgical History  Procedure Laterality Date  . Appendectomy    . Laryngeal polyp excision    . L1 corpectomy   12/10  . Cardiac defibrillator placement       St.Jude ICD   History reviewed. No pertinent family history. History  Substance Use Topics  . Smoking status: Former Smoker    Types: Cigarettes  . Smokeless tobacco: Never Used     Comment: 1 pack a week  . Alcohol Use: No   OB History   Grav Para Term Preterm Abortions TAB SAB Ect Mult Living                 Review of Systems  Constitutional:       Per HPI, otherwise negative  HENT:       Per HPI, otherwise negative  Respiratory:       Per HPI, otherwise negative  Cardiovascular: Positive for chest pain.       Per HPI, otherwise negative  Gastrointestinal: Negative for vomiting.  Endocrine:       Negative aside from HPI  Genitourinary:       Neg aside from HPI   Musculoskeletal: Positive for arthralgias, back pain and myalgias.       Per HPI, otherwise negative  Skin: Negative.   Neurological: Positive for numbness. Negative for syncope.      Allergies  Penicillins; Metoclopramide hcl; Metoprolol; and Statins  Home Medications   Current Outpatient Rx  Name  Route  Sig  Dispense  Refill  . aspirin 81 MG tablet   Oral  Take 81 mg by mouth daily.           . carvedilol (COREG) 12.5 MG tablet   Oral   Take 12.5 mg by mouth 2 (two) times daily with a meal.         . citalopram (CELEXA) 20 MG tablet   Oral   Take 20 mg by mouth daily.          . cyclobenzaprine (FLEXERIL) 10 MG tablet   Oral   Take 10 mg by mouth 3 (three) times daily as needed for muscle spasms.          . digoxin (LANOXIN) 0.25 MG tablet   Oral   Take 0.25 mg by mouth daily.         . famotidine (PEPCID) 20 MG tablet   Oral   Take 20 mg by mouth 2 (two) times daily.           . fish oil-omega-3 fatty acids 1000 MG capsule   Oral   Take 1 g by mouth daily.           . furosemide (LASIX) 20 MG tablet   Oral   Take 10-20 mg by mouth daily. TAKE ONE HALF TABLET ONE DAY AND ALTERNATE ONE TABLETS THE NEXT DAY         . HYDROcodone-acetaminophen (NORCO) 10-325 MG  per tablet   Oral   Take 0.5 tablets by mouth 2 (two) times daily as needed for severe pain. Take 1/2 tab bid         . ibuprofen (ADVIL,MOTRIN) 200 MG tablet   Oral   Take 200 mg by mouth every 6 (six) hours as needed (PAIN).          Marland Kitchen isosorbide mononitrate (IMDUR) 30 MG 24 hr tablet   Oral   Take 30 mg by mouth daily.         . Multiple Vitamins-Minerals (MULTIVITAMINS THER. W/MINERALS) TABS   Oral   Take 1 tablet by mouth daily.           . nitroGLYCERIN (NITROSTAT) 0.4 MG SL tablet   Sublingual   Place 0.4 mg under the tongue every 5 (five) minutes as needed for chest pain.         . potassium chloride SA (K-DUR,KLOR-CON) 20 MEQ tablet   Oral   Take 20 mEq by mouth daily.         . promethazine (PHENERGAN) 25 MG tablet   Oral   Take 25 mg by mouth every 6 (six) hours as needed for nausea.         Marland Kitchen warfarin (COUMADIN) 10 MG tablet   Oral   Take 10 mg by mouth daily. Take 1 tablet on Mon, Wed, Fri, and Sat.  Take 1/2 tablet on Sun, Tues, Thurs.          Triage Vitals: BP 119/76  Pulse 82  Temp(Src) 97.9 F (36.6 C) (Oral)  Resp 20  Ht 5' 6.5" (1.689 m)  Wt 207 lb (93.895 kg)  BMI 32.91 kg/m2  SpO2 96%  Physical Exam  Nursing note and vitals reviewed. Constitutional: She is oriented to person, place, and time. She appears well-developed and well-nourished. No distress.  HENT:  Head: Normocephalic and atraumatic.  Eyes: Conjunctivae and EOM are normal.  Cardiovascular: Normal rate, regular rhythm and intact distal pulses.   Murmur heard. Pulmonary/Chest: Effort normal and breath sounds normal. No stridor. No respiratory distress.  Abdominal: She exhibits no distension.  Musculoskeletal: She exhibits no edema.  Pelvis is stable.   Neurological: She is alert and oriented to person, place, and time. No cranial nerve deficit.  4+ strength with flexion on the left. 5/5 strength on the right.   Skin: Skin is warm and dry.  Psychiatric: She has a  normal mood and affect.    ED Course  Procedures (including critical care time)  DIAGNOSTIC STUDIES: Oxygen Saturation is 96% on room air, normal by my interpretation.    COORDINATION OF CARE: 1:51 PM- Will order blood labs (CBC, CMP, PNP, protime-INR) and a chest x-ray. Will order aspirin to manage symptoms. Discussed treatment plan with patient at bedside and patient verbalized agreement.   4:09 PM- Patient reports feeling a bit better after receiving the medications. Discussed that lab and imaging results were normal. She states that she has enough pain medication at home and does not need any more. Advised her to apply ice to the areas at home. Patient is stable for discharge. Advised her to follow up with her PCP if symptoms do not improve in a few days. Discussed treatment plan with patient at bedside and patient verbalized agreement.   Labs Review Labs Reviewed  COMPREHENSIVE METABOLIC PANEL - Abnormal; Notable for the following:    Glucose, Bld 66 (*)    Total Bilirubin 0.2 (*)    All other components within normal limits  PRO B NATRIURETIC PEPTIDE - Abnormal; Notable for the following:    Pro B Natriuretic peptide (BNP) 914.5 (*)    All other components within normal limits  PROTIME-INR - Abnormal; Notable for the following:    Prothrombin Time 23.8 (*)    INR 2.21 (*)    All other components within normal limits  CBC   Imaging Review Dg Chest 2 View  03/01/2014   CLINICAL DATA:  Chest pain.  EXAM: CHEST  2 VIEW  COMPARISON:  02/22/2010.  FINDINGS: The pacer wires/ AICD are stable. The heart is mildly enlarged. The mediastinal and hilar contours are prominent but unchanged. The lungs are clear. No pleural effusion. Thoracolumbar fusion hardware again demonstrated.  IMPRESSION: Stable cardiac enlargement and pacer wires/ AICD.  No acute pulmonary findings.   Electronically Signed   By: Loralie Champagne M.D.   On: 03/01/2014 14:46   Dg Lumbar Spine 2-3 Views  03/01/2014    CLINICAL DATA:  Lower back pain, recent fall 1 week ago, previous L1 corpectomy and fusion  EXAM: LUMBAR SPINE - 2-3 VIEW  COMPARISON:  12/29/2012  FINDINGS: Remote corpectomy changes at L1 with fusion hardware spanning T12-L2. Mild residual scoliosis at this level. Hardware appears stable. Mild kyphotic angulation at this level as before. No new osseous finding. Atherosclerotic changes of the aorta. Other lumbar vertebral bodies appear intact.  IMPRESSION: Remote postop changes from T12 - L2. No new acute osseous finding or significant interval change   Electronically Signed   By: Ruel Favors M.D.   On: 03/01/2014 15:35    EKG Interpretation   Date/Time:  Tuesday March 01 2014 13:32:08 EST Ventricular Rate:  72 PR Interval:  186 QRS Duration: 100 QT Interval:  418 QTC Calculation: 457 R Axis:   92 Text Interpretation:  Sinus rhythm with occasional Premature ventricular  complexes Septal infarct (cited on or before 15-Dec-2009) Lateral infarct  , age undetermined Abnormal ECG When compared with ECG of 25-Dec-2009  15:18, Premature ventricular complexes are now Present Lateral infarct is  now Present Questionable change in initial forces of Septal  leads ST less  elevated in Lateral leads Sinus rhythm Premature ventricular complexes  Artifact Abnormal ekg Confirmed by [name] MunchLOCKWOOD, Osmin Welz  MD [phone] on 03/01/2014  2:02:34 PM      MDM    I personally performed the services described in this documentation, which was scribed in my presence. The recorded information has been reviewed and is accurate.   This patient with history of CAD, PVD, prior lumbar spinal surgery, now presents after a fall with pain in the left hip, low back.  This the patient has had episodes of chest pain, though her description of these as being similar to those that the patient experienced multiple times previously is reassuring. Patient's evaluation was largely unremarkable, with return of labs, x-ray.  The patient's  Coumadin level is therapeutic, any further reassuring for the low suspicion of ongoing coronary ischemia.  Patient does have ischemic cardiomyopathy, and has capacity to follow up with her primary care physician for further evaluation and management.  With return of labs, vitals, no evidence of distress the patient was stable for discharge with  further evaluation to take place as an outpatient.  [name] Munchobert Dellamae Rosamilia, MD 03/01/14 [phone]

## 2014-03-01 NOTE — ED Notes (Signed)
Patient states she took 81mg  aspirin at 0900 today.

## 2014-03-02 ENCOUNTER — Ambulatory Visit (INDEPENDENT_AMBULATORY_CARE_PROVIDER_SITE_OTHER): Payer: Medicaid Other | Admitting: *Deleted

## 2014-03-02 ENCOUNTER — Telehealth: Payer: Self-pay | Admitting: *Deleted

## 2014-03-02 DIAGNOSIS — R894 Abnormal immunological findings in specimens from other organs, systems and tissues: Secondary | ICD-10-CM

## 2014-03-02 DIAGNOSIS — Z5181 Encounter for therapeutic drug level monitoring: Secondary | ICD-10-CM

## 2014-03-02 DIAGNOSIS — I679 Cerebrovascular disease, unspecified: Secondary | ICD-10-CM

## 2014-03-02 NOTE — Telephone Encounter (Signed)
Patient states she went to ER yesterday.  Her INR checked in ER and she wanted to cancel todays appointment.  Patient states she does have a question about medication assistance and asked Misty Stanley RN to call her.

## 2014-03-02 NOTE — Telephone Encounter (Signed)
See coumadin note. 

## 2014-03-03 ENCOUNTER — Ambulatory Visit (HOSPITAL_COMMUNITY)
Admission: RE | Admit: 2014-03-03 | Discharge: 2014-03-03 | Disposition: A | Discharge status: Home / Self Care | Payer: Medicaid Other | Source: Ambulatory Visit | Attending: Pulmonary Disease | Admitting: Pulmonary Disease

## 2014-03-03 ENCOUNTER — Telehealth (HOSPITAL_COMMUNITY): Payer: Self-pay | Admitting: Physical Therapy

## 2014-03-03 DIAGNOSIS — IMO0001 Reserved for inherently not codable concepts without codable children: Secondary | ICD-10-CM | POA: Insufficient documentation

## 2014-03-03 DIAGNOSIS — Z981 Arthrodesis status: Secondary | ICD-10-CM | POA: Insufficient documentation

## 2014-03-03 DIAGNOSIS — M6281 Muscle weakness (generalized): Secondary | ICD-10-CM | POA: Insufficient documentation

## 2014-03-03 DIAGNOSIS — M545 Low back pain, unspecified: Secondary | ICD-10-CM | POA: Insufficient documentation

## 2014-03-22 ENCOUNTER — Ambulatory Visit (INDEPENDENT_AMBULATORY_CARE_PROVIDER_SITE_OTHER): Payer: Medicaid Other | Admitting: Cardiology

## 2014-03-22 ENCOUNTER — Encounter: Payer: Self-pay | Admitting: Cardiology

## 2014-03-22 VITALS — BP 115/62 | HR 73 | Ht 66.0 in | Wt 204.0 lb

## 2014-03-22 DIAGNOSIS — E785 Hyperlipidemia, unspecified: Secondary | ICD-10-CM

## 2014-03-22 DIAGNOSIS — I10 Essential (primary) hypertension: Secondary | ICD-10-CM

## 2014-03-22 DIAGNOSIS — I2589 Other forms of chronic ischemic heart disease: Secondary | ICD-10-CM

## 2014-03-22 DIAGNOSIS — I739 Peripheral vascular disease, unspecified: Secondary | ICD-10-CM

## 2014-03-22 DIAGNOSIS — R894 Abnormal immunological findings in specimens from other organs, systems and tissues: Secondary | ICD-10-CM

## 2014-03-22 NOTE — Assessment & Plan Note (Addendum)
Continue medical therapy and observation. 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

## 2014-03-22 NOTE — Patient Instructions (Signed)
Your physician wants you to follow-up in: 6 months You will receive a reminder letter in the mail two months in advance. If you don't receive a letter, please call our office to schedule the follow-up appointment.     Your physician recommends that you continue on your current medications as directed. Please refer to the Current Medication list given to you today.      Thank you for choosing [facility] !        

## 2014-03-22 NOTE — Progress Notes (Signed)
Clinical Summary [name] [name] is a 53 y.o.female most recently seen in the office by Dr. Kirke Corin in February. She has had claudication symptoms with reduced ABI on the left and evidence of moderate disease extending from the left common iliac to the left external iliac. Angiography is eventually planned once she is able to obtain Lovenox for bridging while off anticoagulation. She tells me that she was just recently approved for Medicaid.  I last saw her back in October 2014. Last ischemic evaluation was via a Lexiscan Myoview in February 2014 revealing a large area of scar in the apical and anteroseptal distributions, LVEF 28%. Concurrently performed echocardiogram revealed LVEF 25-30% with grade 1 diastolic dysfunction, mild to moderate mitral regurgitation. She reports intermittent chest pain symptoms, nothing progressive. States that she has been compliant with her medications. She reports functional limitations related to leg pain, not all of which sound like claudication. May have a neuropathic component as well.  Lipid panel from October 2014 showed total cholesterol 162, triglycerides 189, HDL 33, and LDL 91. We had tried low-dose statin previously, she had difficulty tolerating it. She is omega-3 supplements.   Allergies  Allergen Reactions  . Penicillins Anaphylaxis      shock  . Metoclopramide Hcl     Non responsive  . Metoprolol     Syncope   . Statins     Myalgias    Current Outpatient Prescriptions  Medication Sig Dispense Refill  . aspirin EC 81 MG tablet Take 81 mg by mouth daily.      . carvedilol (COREG) 12.5 MG tablet Take 12.5 mg by mouth 2 (two) times daily with a meal.      . citalopram (CELEXA) 20 MG tablet Take 20 mg by mouth daily.       . cyclobenzaprine (FLEXERIL) 10 MG tablet Take 10 mg by mouth 3 (three) times daily as needed for muscle spasms.       . digoxin (LANOXIN) 0.25 MG tablet Take 0.25 mg by mouth at bedtime.       . famotidine (PEPCID) 20 MG tablet  Take 20 mg by mouth 2 (two) times daily.        . fish oil-omega-3 fatty acids 1000 MG capsule Take 1 g by mouth daily.        . furosemide (LASIX) 20 MG tablet Take 20-40 mg by mouth daily. Patient alternates taking 20mg  one day then 40mg  the next day (Patient had 40mg  03/01/14)      . HYDROcodone-acetaminophen (NORCO) 10-325 MG per tablet Take 1 tablet by mouth 4 (four) times daily as needed for severe pain. pain      . ibuprofen (ADVIL,MOTRIN) 200 MG tablet Take 400 mg by mouth every 6 (six) hours as needed (PAIN). pain      . isosorbide mononitrate (IMDUR) 30 MG 24 hr tablet Take 30 mg by mouth daily.      . Multiple Vitamins-Minerals (MULTIVITAMINS THER. W/MINERALS) TABS Take 1 tablet by mouth daily.        . nitroGLYCERIN (NITROSTAT) 0.4 MG SL tablet Place 0.4 mg under the tongue every 5 (five) minutes as needed for chest pain.      . potassium chloride SA (K-DUR,KLOR-CON) 20 MEQ tablet Take 20 mEq by mouth daily.      . promethazine (PHENERGAN) 25 MG tablet Take 25 mg by mouth every 6 (six) hours as needed for nausea.      Marland Kitchen warfarin (COUMADIN) 10 MG tablet Take 5-10 mg  by mouth daily. Take 1 tablet on Mon, Wed, Fri, and Sat.  Take 1/2 tablet on Sun, Tues, Thurs.       No current facility-administered medications for this visit.    Past Medical History  Diagnosis Date  . History of pneumonia   . Chronic systolic heart failure   . 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 Coronary atherosclerosis of native coronary artery     Severe two vessel disease   . Cocaine abuse in remission   . Raynaud's phenomenon   . Essential hypertension, benign   . Hyperlipidemia   . Mixed hyperlipidemia     LVEF 30-35%  . Anticardiolipin antibody positive     Chronic Coumadin  . CHF (congestive heart failure)   . PVD (peripheral vascular disease)     Past Surgical History  Procedure Laterality Date  . Appendectomy    . Laryngeal polyp excision    . L1 corpectomy   12/10  .  Cardiac defibrillator placement      St.Jude ICD    Social History [name] reports that she has quit smoking. Her smoking use included Cigarettes. She smoked 0.00 packs per day. She has never used smokeless tobacco. [name] reports that she does not drink alcohol.  Review of Systems No palpitations or syncope. No device discharges. Otherwise as outlined above.  Physical Examination Filed Vitals:   03/22/14 0840  BP: 115/62  Pulse: 73   Filed Weights   03/22/14 0840  Weight: 204 lb (92.534 kg)    Overweight woman. Appears comfortable.  HEENT: Conjunctiva and lids are normal, oropharynx clear.  Neck: Supple no loud carotid bruits, very soft bruit on the right.  Lungs: Clear to auscultation without labored breathing.  Cardiac: Regular rate and rhythm, no S3 gallop. PMI indistinct. No loud systolic murmur.  Abdomen: Soft nontender, no hepatomegalyl. Bowel sounds present.  Extremities: Diminished dorsalis pedis pulses bilaterally. Mild pedal edema.  Musculoskeletal: No kyphosis noted.  Skin: Warm and dry.  Neuropsychiatric: Patient alert and oriented x3, affect is grossly normal.   Problem List and Plan   CARDIOMYOPATHY, ISCHEMIC Continue medical therapy and observation. 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   ANTICARDIOLIPIN ANTIBODY SYNDROME History of previous stroke with occluded carotid, also cardiovascular disease. She is on long-term anticoagulation. Bridging with Lovenox is required while coming off Coumadin for anticipated peripheral vascular procedure. She states that she has been approved for Medicaid and hopefully will be able to get Lovenox soon.  Essential hypertension, benign Blood pressure is normal today.  HYPERLIPIDEMIA Has had statin intolerances. Currently on omega-3 supplements.  Peripheral arterial disease Keep followup with Dr. Kirke CorinArida.    Jonelle SidleSamuel G. Solstice Lastinger, M.D., F.A.C.C.

## 2014-03-22 NOTE — Assessment & Plan Note (Signed)
Has had statin intolerances. Currently on omega-3 supplements.

## 2014-03-22 NOTE — Assessment & Plan Note (Signed)
History of previous stroke with occluded carotid, also cardiovascular disease. She is on long-term anticoagulation. Bridging with Lovenox is required while coming off Coumadin for anticipated peripheral vascular procedure. She states that she has been approved for Medicaid and hopefully will be able to get Lovenox soon.

## 2014-03-22 NOTE — Assessment & Plan Note (Signed)
Blood pressure is normal today. 

## 2014-03-22 NOTE — Assessment & Plan Note (Signed)
Keep follow up with Dr. Arida ?

## 2014-03-29 ENCOUNTER — Telehealth: Payer: Self-pay

## 2014-03-29 NOTE — Telephone Encounter (Signed)
Patient wanted Dr. Kirke Corin to know that she has medicaid now and can get her Lovenox shots  She wants to know when Dr. Kirke Corin would like to do her procedure

## 2014-03-29 NOTE — Telephone Encounter (Signed)
Pt called,left vm,  states she has medicaid now, and was on hold for Lovenox shots. Would like a call back.

## 2014-03-30 ENCOUNTER — Ambulatory Visit (INDEPENDENT_AMBULATORY_CARE_PROVIDER_SITE_OTHER): Payer: Medicaid Other | Admitting: *Deleted

## 2014-03-30 DIAGNOSIS — Z7901 Long term (current) use of anticoagulants: Secondary | ICD-10-CM

## 2014-03-30 DIAGNOSIS — R894 Abnormal immunological findings in specimens from other organs, systems and tissues: Secondary | ICD-10-CM

## 2014-03-30 DIAGNOSIS — I679 Cerebrovascular disease, unspecified: Secondary | ICD-10-CM

## 2014-03-30 DIAGNOSIS — Z5181 Encounter for therapeutic drug level monitoring: Secondary | ICD-10-CM

## 2014-03-30 LAB — POCT INR: INR: 3

## 2014-03-31 ENCOUNTER — Other Ambulatory Visit: Payer: Self-pay | Admitting: Adult Health

## 2014-04-03 NOTE — Telephone Encounter (Signed)
I can do it on the 15th but she will need an office visit before that. Add her to my schedule on Thursday.

## 2014-04-04 NOTE — Telephone Encounter (Signed)
You can add to my schedule on 4/9 at 9:30 am.

## 2014-04-04 NOTE — Telephone Encounter (Signed)
Next available appointment  is 04/15/14 is it ok to have patient seen then

## 2014-04-04 NOTE — Telephone Encounter (Signed)
Appointment scheduled.  Pt aware.  

## 2014-04-07 ENCOUNTER — Ambulatory Visit (INDEPENDENT_AMBULATORY_CARE_PROVIDER_SITE_OTHER): Payer: Medicaid Other | Admitting: Cardiovascular Disease

## 2014-04-07 ENCOUNTER — Encounter: Payer: Self-pay | Admitting: Cardiovascular Disease

## 2014-04-07 VITALS — BP 120/75 | HR 60 | Ht 67.0 in | Wt 208.2 lb

## 2014-04-07 DIAGNOSIS — R079 Chest pain, unspecified: Secondary | ICD-10-CM

## 2014-04-07 DIAGNOSIS — I739 Peripheral vascular disease, unspecified: Secondary | ICD-10-CM

## 2014-04-07 NOTE — Assessment & Plan Note (Signed)
She continues to have significant left-sided claudication likely due to known iliac disease. She also has chronic back pain which is not related to PAD but more likely chronic neuropathic pain. Continue medical therapy for now. Proceed with angiography once she is able to obtain Lovenox.

## 2014-04-07 NOTE — Progress Notes (Signed)
Primary cardiologist: Dr. Diona Browner.  Electrophysiologist: Dr. Ladona Ridgel.  HPI  [name] [name] is a 53 year old female who is here today for a followup visit regarding  peripheral arterial disease. She has known history of ischemic cardiomyopathy with recent ejection fraction of 25-30%, chronic systolic heart failure, well compensated, status post ICD implantation. She has been relatively stable from a cardiac standpoint. She is a former smoker and is not diabetic. She does have antiphospholipid syndrome on life long anticoagulation with warfarin.  She was seen last year for left leg claudication with mildly reduced ABI. ABI on the left side was 0.81. Aortoiliac duplex showed moderate diffuse disease extending from the left common iliac to the external iliac artery. She reported worsening symptoms of left buttock and thigh claudication . Thus, the plan was to proceed with abdominal aortogram with lower extremity runoff and possible angioplasty. However, she requires bridging with Lovenox and could not afford this medication. Symptoms are still the same. She continues to have chronic back pain which is likely not related to her iliac  disease. She had previous back surgery.   Allergies  Allergen Reactions  . Penicillins Anaphylaxis      shock  . Metoclopramide Hcl     Non responsive  . Metoprolol     Syncope   . Statins     Myalgias     Current Outpatient Prescriptions on File Prior to Visit  Medication Sig Dispense Refill  . aspirin EC 81 MG tablet Take 81 mg by mouth daily.      . carvedilol (COREG) 12.5 MG tablet Take 12.5 mg by mouth 2 (two) times daily with a meal.      . citalopram (CELEXA) 20 MG tablet Take 20 mg by mouth daily.       . cyclobenzaprine (FLEXERIL) 10 MG tablet Take 10 mg by mouth 3 (three) times daily as needed for muscle spasms.       . digoxin (LANOXIN) 0.25 MG tablet Take 0.25 mg by mouth at bedtime.       . famotidine (PEPCID) 20 MG tablet Take 20 mg by mouth 2  (two) times daily.        . fish oil-omega-3 fatty acids 1000 MG capsule Take 1 g by mouth daily.        . furosemide (LASIX) 20 MG tablet Take 20-40 mg by mouth daily. Patient alternates taking 20mg  one day then 40mg  the next day (Patient had 40mg  03/01/14)      . HYDROcodone-acetaminophen (NORCO) 10-325 MG per tablet Take 1 tablet by mouth 4 (four) times daily as needed for severe pain. pain      . ibuprofen (ADVIL,MOTRIN) 200 MG tablet Take 400 mg by mouth every 6 (six) hours as needed (PAIN). pain      . isosorbide mononitrate (IMDUR) 30 MG 24 hr tablet TAKE ONE TABLET BY MOUTH ONCE DAILY  90 tablet  3  . Multiple Vitamins-Minerals (MULTIVITAMINS THER. W/MINERALS) TABS Take 1 tablet by mouth daily.        . nitroGLYCERIN (NITROSTAT) 0.4 MG SL tablet Place 0.4 mg under the tongue every 5 (five) minutes as needed for chest pain.      . potassium chloride SA (K-DUR,KLOR-CON) 20 MEQ tablet Take 20 mEq by mouth daily.      . promethazine (PHENERGAN) 25 MG tablet Take 25 mg by mouth every 6 (six) hours as needed for nausea.      Marland Kitchen warfarin (COUMADIN) 10 MG tablet Take 5-10 mg by  mouth daily. Take 1 tablet on Mon, Wed, Fri, and Sat.  Take 1/2 tablet on Sun, Tues, Thurs.       No current facility-administered medications on file prior to visit.     Past Medical History  Diagnosis Date  . History of pneumonia   . Chronic systolic heart failure   . 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 Coronary atherosclerosis of native coronary artery     Severe two vessel disease   . Cocaine abuse in remission   . Raynaud's phenomenon   . Essential hypertension, benign   . Hyperlipidemia   . Mixed hyperlipidemia     LVEF 30-35%  . Anticardiolipin antibody positive     Chronic Coumadin  . CHF (congestive heart failure)   . PVD (peripheral vascular disease)      Past Surgical History  Procedure Laterality Date  . Appendectomy    . Laryngeal polyp excision    . L1 corpectomy    12/10  . Cardiac defibrillator placement      St.Jude ICD     Family History  Problem Relation Age of Onset  . Family history unknown: Yes     History   Social History  . Marital Status: Single    Spouse Name: N/A    Number of Children: N/A  . Years of Education: N/A   Occupational History  . Part-time Runner, broadcasting/film/videoteacher     [employer]   Social History Main Topics  . Smoking status: Former Smoker    Types: Cigarettes  . Smokeless tobacco: Never Used     Comment: 1 pack a week  . Alcohol Use: No  . Drug Use: No     Comment: Prior history of cocaine  . Sexual Activity: Not on file   Other Topics Concern  . Not on file   Social History Narrative  . No narrative on file      PHYSICAL EXAM   BP 120/75  Pulse 60  Ht 5\' 7"  (1.702 m)  Wt 208 lb 4 oz (94.462 kg)  BMI 32.61 kg/m2 Constitutional: She is oriented to person, place, and time. She appears well-developed and well-nourished. No distress.  HENT: No nasal discharge.  Head: Normocephalic and atraumatic.  Eyes: Pupils are equal and round. No discharge.  Neck: Normal range of motion. Neck supple. No JVD present. No thyromegaly present.  Cardiovascular: Normal rate, regular rhythm, normal heart sounds. Exam reveals no gallop and no friction rub. No murmur heard.  Pulmonary/Chest: Effort normal and breath sounds normal. No stridor. No respiratory distress. She has no wheezes. She has no rales. She exhibits no tenderness.  Abdominal: Soft. Bowel sounds are normal. She exhibits no distension. There is no tenderness. There is no rebound and no guarding.  Musculoskeletal: Normal range of motion. She exhibits no edema and no tenderness.  Neurological: She is alert and oriented to person, place, and time. Coordination normal.  Skin: Skin is warm and dry. No rash noted. She is not diaphoretic. No erythema. No pallor.  Psychiatric: She has a normal mood and affect. Her behavior is normal. Judgment and thought content normal.  Vascular:  Femoral pulses normal on the right side and mildly diminished on the left side. Distal pulses are mildly diminished bilaterally.  EAV:[id number]:Sinus  Rhythm  -Old lateral infarct  -Right axis -may be secondary to infarct.   -ST depression   +   Nonspecific T-abnormality  -Nondiagnostic.   ABNORMAL  ASSESSMENT AND PLAN

## 2014-04-07 NOTE — Patient Instructions (Signed)
Continue same medications.   Call us once you have your Medicaid card.

## 2014-04-16 ENCOUNTER — Other Ambulatory Visit: Payer: Self-pay | Admitting: Cardiology

## 2014-04-25 ENCOUNTER — Ambulatory Visit (INDEPENDENT_AMBULATORY_CARE_PROVIDER_SITE_OTHER): Payer: Medicaid Other | Admitting: *Deleted

## 2014-04-25 DIAGNOSIS — I428 Other cardiomyopathies: Secondary | ICD-10-CM

## 2014-04-28 ENCOUNTER — Encounter: Payer: Self-pay | Admitting: Internal Medicine

## 2014-04-29 ENCOUNTER — Ambulatory Visit (INDEPENDENT_AMBULATORY_CARE_PROVIDER_SITE_OTHER): Payer: Medicaid Other | Admitting: *Deleted

## 2014-04-29 DIAGNOSIS — I679 Cerebrovascular disease, unspecified: Secondary | ICD-10-CM

## 2014-04-29 DIAGNOSIS — Z5181 Encounter for therapeutic drug level monitoring: Secondary | ICD-10-CM

## 2014-04-29 DIAGNOSIS — Z7901 Long term (current) use of anticoagulants: Secondary | ICD-10-CM

## 2014-04-29 DIAGNOSIS — R894 Abnormal immunological findings in specimens from other organs, systems and tissues: Secondary | ICD-10-CM

## 2014-04-29 LAB — POCT INR: INR: 1.7

## 2014-04-30 LAB — MDC_IDC_ENUM_SESS_TYPE_REMOTE
Battery Voltage: 2.89 V
Brady Statistic RA Percent Paced: 0 %
Brady Statistic RV Percent Paced: 0 %
Date Time Interrogation Session: 20150430082045
HighPow Impedance: 45 Ohm
Implantable Pulse Generator Serial Number: 504498
Lead Channel Impedance Value: 511 Ohm
Lead Channel Impedance Value: 535 Ohm
Lead Channel Sensing Intrinsic Amplitude: 1.8 mV
Lead Channel Sensing Intrinsic Amplitude: 25 mV
Lead Channel Setting Pacing Amplitude: 2 V
Lead Channel Setting Pacing Amplitude: 2.4 V
Lead Channel Setting Pacing Pulse Width: 0.5 ms
Zone Setting Detection Interval: 300 ms
Zone Setting Detection Interval: 352.9 ms

## 2014-05-05 ENCOUNTER — Telehealth: Payer: Self-pay

## 2014-05-05 ENCOUNTER — Ambulatory Visit (INDEPENDENT_AMBULATORY_CARE_PROVIDER_SITE_OTHER): Payer: Medicaid Other | Admitting: *Deleted

## 2014-05-05 DIAGNOSIS — R894 Abnormal immunological findings in specimens from other organs, systems and tissues: Secondary | ICD-10-CM

## 2014-05-05 DIAGNOSIS — Z7901 Long term (current) use of anticoagulants: Secondary | ICD-10-CM

## 2014-05-05 DIAGNOSIS — I679 Cerebrovascular disease, unspecified: Secondary | ICD-10-CM

## 2014-05-05 DIAGNOSIS — I739 Peripheral vascular disease, unspecified: Secondary | ICD-10-CM

## 2014-05-05 DIAGNOSIS — Z01812 Encounter for preprocedural laboratory examination: Secondary | ICD-10-CM

## 2014-05-05 DIAGNOSIS — Z5181 Encounter for therapeutic drug level monitoring: Secondary | ICD-10-CM

## 2014-05-05 LAB — POCT INR: INR: 2.4

## 2014-05-05 NOTE — Telephone Encounter (Signed)
Pt called and states she is meeting with the coumadin nurse today to start Lovenox bridge. States she has medicaid now, and "can schedule my surgery now"

## 2014-05-06 ENCOUNTER — Encounter: Payer: Self-pay | Admitting: *Deleted

## 2014-05-06 ENCOUNTER — Telehealth: Payer: Self-pay | Admitting: *Deleted

## 2014-05-06 ENCOUNTER — Encounter (HOSPITAL_COMMUNITY): Payer: Self-pay | Admitting: Pharmacy Technician

## 2014-05-06 NOTE — Telephone Encounter (Signed)
Called patient to inform her that her Abdominal Aortogram has been scheduled 05/11/14 at 0830 am  She will need to arrive at 0630 AM Spoke with [name] Hey RN she will contact patient to set up Lovenox bridge  Labs ordered  Patient to have labs done at [facility] in [place] No chest x ray needed per Dr. Kirke Corin Letter (instructions) reviewed with patient  Patient verbalized understanding

## 2014-05-06 NOTE — Telephone Encounter (Signed)
Pt walked in office for lovenox instructions.  She was notified today that she is scheduled for an abdominal aortogram on 5/13 by Dr Kirke Corin.  She will stop coumadin today.  Lovenox bridging instructions given to pt as follows: 5/8  No coumadin 5/9  No Lovenox or coumadin 5/10  Lovenox 100mg  BID 5/11  Lovenox 100mg  BID 5/12  Lovenox 100mg  am only 5/13  No lovenox in am---procedure---restart lovenox if ok with Dr Kirke Corin, start coumadin 10mg  @ night 5/14  Lovenox 100mg  bid and coumadin 10mg  pm 5/15  Lovenox 100mg  bid and coumadin 10mg  pm 5/16  Lovenox 100mg  bid and coumadin 10mg  pm 5/17  Lovenox 100mg  bid and coumadin 10mg  pm 5/18  Lovenox 100mg  am----INR appt @ 10:40am Schedule given to pt and she verbalized understanding. Lovenox 100mg  syringes # 10 x 1 refill called to [facility] 03/01/14  BUN 7  SrCr 0.76  CrCl 130.43  Wt 208

## 2014-05-06 NOTE — Telephone Encounter (Signed)
Schedule her for an Abdominal aortogram, LE angiography and possible endovascular intervention on Wednesday at [facility] lab. Needs precath labs. No X ray. Hold Warfarin and arrange bridging with Lovenox with Misty Stanley in Coumadin clinic.

## 2014-05-09 ENCOUNTER — Telehealth: Payer: Self-pay

## 2014-05-09 NOTE — Telephone Encounter (Signed)
Pharmacy to fax PA forms for Lovenox today 05/09/14

## 2014-05-09 NOTE — Telephone Encounter (Signed)
Ok. Do we need to fill out PA forms?

## 2014-05-09 NOTE — Telephone Encounter (Signed)
Pt called and states she needs prior approval through Medicaid for her Lovenox shots, states she did not stop her Coumadin on Friday for her procedure on Thursday, states she is going to have to cancel .   Informed patient to let us know when she has approval for Lovenox and we will reschedule.  Patient verbalized understanding   Pro[facility] at [facility]

## 2014-05-09 NOTE — Telephone Encounter (Signed)
Pt called and states she needs prior approval through Medicaid for her Lovenox shots, states she did not stop her Coumadin on Friday for her procedure on Thursday, states she is going to have to cancel . Please call.

## 2014-05-10 NOTE — Telephone Encounter (Signed)
PA in Dr. Sheilah Pigeon box to be signed 5/12

## 2014-05-11 ENCOUNTER — Encounter (HOSPITAL_COMMUNITY): Admission: RE | Payer: Self-pay | Source: Ambulatory Visit

## 2014-05-11 ENCOUNTER — Ambulatory Visit (HOSPITAL_COMMUNITY): Admission: RE | Admit: 2014-05-11 | Payer: Medicaid Other | Source: Ambulatory Visit | Admitting: Cardiovascular Disease

## 2014-05-11 ENCOUNTER — Telehealth: Payer: Self-pay | Admitting: *Deleted

## 2014-05-11 SURGERY — ABDOMINAL AORTAGRAM
Anesthesia: LOCAL

## 2014-05-11 NOTE — Telephone Encounter (Signed)
Pt called back, wanted to let you know she is at home now

## 2014-05-11 NOTE — Telephone Encounter (Signed)
Patient Lovenox bridge has been approved  She would like to reschedule her procedure

## 2014-05-11 NOTE — Telephone Encounter (Signed)
Patient can not have procedure on 05/18/14 because her mother is having surgery  She wants to know if she can switch to 05/25/14

## 2014-05-11 NOTE — Telephone Encounter (Signed)
PA faxed 5/13

## 2014-05-11 NOTE — Telephone Encounter (Signed)
Patient called and is ready to schedule stent. Please call after 1:00pm

## 2014-05-11 NOTE — Telephone Encounter (Signed)
Yes. That's fine.  

## 2014-05-11 NOTE — Telephone Encounter (Signed)
Left message to let patient know that her Abd Aortogram has been rescheduled to 05/18/14 at 08:30 am.  Patient to arrive at 06:30 am.

## 2014-05-11 NOTE — Telephone Encounter (Signed)
5/27 is ok.

## 2014-05-12 ENCOUNTER — Telehealth: Payer: Self-pay | Admitting: *Deleted

## 2014-05-12 ENCOUNTER — Encounter: Payer: Self-pay | Admitting: Cardiology

## 2014-05-12 NOTE — Telephone Encounter (Signed)
Pt was given lovenox instructions last week but had to cancel surgery because she had to get pre-authorization for lovenox,  Surgery has been rescheduled for 5/27.  Pt told to follow same coumadin/lovenox instructions.  F/U INR appt made for 05/30/14.  She verbalized understanding.

## 2014-05-12 NOTE — Telephone Encounter (Signed)
Pt is going to have surgery on 05/25/14 need to know when to come in for Lovenox bridging. When does she need to come in for appt? She has appt now for 05/16/14. She needs afternoon appts

## 2014-05-12 NOTE — Telephone Encounter (Signed)
Informed patient that her procedure was rescheduled for 5/27 @ 0830 am  Instructed patient to arrive @ 0630 am  Patient verbalized understanding

## 2014-05-20 ENCOUNTER — Telehealth: Payer: Self-pay | Admitting: *Deleted

## 2014-05-20 ENCOUNTER — Other Ambulatory Visit: Payer: Self-pay | Admitting: Cardiovascular Disease

## 2014-05-20 LAB — CBC WITH DIFFERENTIAL/PLATELET
Basophils Absolute: 0.1 10*3/uL (ref 0.0–0.1)
Basophils Relative: 1 % (ref 0–1)
Eosinophils Absolute: 0.3 10*3/uL (ref 0.0–0.7)
Eosinophils Relative: 4 % (ref 0–5)
HCT: 40.9 % (ref 36.0–46.0)
Hemoglobin: 14 g/dL (ref 12.0–15.0)
Lymphocytes Relative: 29 % (ref 12–46)
Lymphs Abs: 1.9 10*3/uL (ref 0.7–4.0)
MCH: 28.7 pg (ref 26.0–34.0)
MCHC: 34.2 g/dL (ref 30.0–36.0)
MCV: 83.8 fL (ref 78.0–100.0)
Monocytes Absolute: 0.6 10*3/uL (ref 0.1–1.0)
Monocytes Relative: 9 % (ref 3–12)
Neutro Abs: 3.6 10*3/uL (ref 1.7–7.7)
Neutrophils Relative %: 57 % (ref 43–77)
Platelets: 172 10*3/uL (ref 150–400)
RBC: 4.88 MIL/uL (ref 3.87–5.11)
RDW: 13.5 % (ref 11.5–15.5)
WBC: 6.4 10*3/uL (ref 4.0–10.5)

## 2014-05-20 NOTE — Telephone Encounter (Signed)
Labs orders [facility]ed to [facility] per [facility] request

## 2014-05-21 LAB — BASIC METABOLIC PANEL
BUN: 9 mg/dL (ref 6–23)
CO2: 29 mEq/L (ref 19–32)
Calcium: 9.1 mg/dL (ref 8.4–10.5)
Chloride: 98 mEq/L (ref 96–112)
Creat: 0.7 mg/dL (ref 0.50–1.10)
Glucose, Bld: 121 mg/dL — ABNORMAL HIGH (ref 70–99)
Potassium: 4.5 mEq/L (ref 3.5–5.3)
Sodium: 137 mEq/L (ref 135–145)

## 2014-05-21 LAB — PROTIME-INR
INR: 1.91 — ABNORMAL HIGH (ref <1.50)
Prothrombin Time: 21.5 seconds — ABNORMAL HIGH (ref 11.6–15.2)

## 2014-05-24 ENCOUNTER — Telehealth: Payer: Self-pay | Admitting: *Deleted

## 2014-05-24 ENCOUNTER — Other Ambulatory Visit: Payer: Self-pay | Admitting: Cardiovascular Disease

## 2014-05-24 DIAGNOSIS — I739 Peripheral vascular disease, unspecified: Secondary | ICD-10-CM

## 2014-05-24 NOTE — Telephone Encounter (Signed)
Reached patient and informed her that per Dr Kirke Corin they will check her INR in the morning before her procedure  Patient stated she took her last coumadin tablet on Sunday 05/22/14

## 2014-05-24 NOTE — Telephone Encounter (Signed)
Received voice mail from patient that she "forgot" to start her Lovenox bridge for her upcoming procedure on 05/26/14  Informed Dr Kirke Corin who stated she will have to have her INR checked today before we can go forward with procedure  Attempted to reach patient at both numbers listed  No answer at either number  Voicemail is full at this time

## 2014-05-24 NOTE — Telephone Encounter (Signed)
Received voice mail from patient that she "forgot" to start her Lovenox bridge for her upcoming procedure on 05/26/14  Informed Dr Arida who stated she will have to have her INR checked today before we can go forward with procedure  Attempted to reach patient at both numbers listed  No answer at either number  Voicemail is full at this time   

## 2014-05-25 ENCOUNTER — Encounter (HOSPITAL_COMMUNITY)
Admission: RE | Disposition: A | Discharge status: Home / Self Care | Payer: Self-pay | Source: Ambulatory Visit | Attending: Cardiovascular Disease

## 2014-05-25 ENCOUNTER — Ambulatory Visit (HOSPITAL_COMMUNITY)
Admission: RE | Admit: 2014-05-25 | Discharge: 2014-05-25 | Disposition: A | Discharge status: Home / Self Care | Payer: Medicaid Other | Source: Ambulatory Visit | Attending: Cardiovascular Disease | Admitting: Cardiovascular Disease

## 2014-05-25 DIAGNOSIS — I509 Heart failure, unspecified: Secondary | ICD-10-CM | POA: Insufficient documentation

## 2014-05-25 DIAGNOSIS — E782 Mixed hyperlipidemia: Secondary | ICD-10-CM | POA: Insufficient documentation

## 2014-05-25 DIAGNOSIS — I739 Peripheral vascular disease, unspecified: Secondary | ICD-10-CM

## 2014-05-25 DIAGNOSIS — I7300 Raynaud's syndrome without gangrene: Secondary | ICD-10-CM | POA: Insufficient documentation

## 2014-05-25 DIAGNOSIS — I708 Atherosclerosis of other arteries: Secondary | ICD-10-CM | POA: Insufficient documentation

## 2014-05-25 DIAGNOSIS — I10 Essential (primary) hypertension: Secondary | ICD-10-CM | POA: Insufficient documentation

## 2014-05-25 DIAGNOSIS — D6859 Other primary thrombophilia: Secondary | ICD-10-CM | POA: Insufficient documentation

## 2014-05-25 DIAGNOSIS — I2589 Other forms of chronic ischemic heart disease: Secondary | ICD-10-CM | POA: Insufficient documentation

## 2014-05-25 DIAGNOSIS — I6529 Occlusion and stenosis of unspecified carotid artery: Secondary | ICD-10-CM | POA: Insufficient documentation

## 2014-05-25 DIAGNOSIS — Z95810 Presence of automatic (implantable) cardiac defibrillator: Secondary | ICD-10-CM | POA: Insufficient documentation

## 2014-05-25 DIAGNOSIS — I70219 Atherosclerosis of native arteries of extremities with intermittent claudication, unspecified extremity: Secondary | ICD-10-CM

## 2014-05-25 DIAGNOSIS — Z7982 Long term (current) use of aspirin: Secondary | ICD-10-CM | POA: Insufficient documentation

## 2014-05-25 DIAGNOSIS — Z7901 Long term (current) use of anticoagulants: Secondary | ICD-10-CM | POA: Insufficient documentation

## 2014-05-25 DIAGNOSIS — Z8673 Personal history of transient ischemic attack (TIA), and cerebral infarction without residual deficits: Secondary | ICD-10-CM | POA: Insufficient documentation

## 2014-05-25 DIAGNOSIS — I2510 Atherosclerotic heart disease of native coronary artery without angina pectoris: Secondary | ICD-10-CM | POA: Insufficient documentation

## 2014-05-25 DIAGNOSIS — Z87891 Personal history of nicotine dependence: Secondary | ICD-10-CM | POA: Insufficient documentation

## 2014-05-25 DIAGNOSIS — I5022 Chronic systolic (congestive) heart failure: Secondary | ICD-10-CM | POA: Insufficient documentation

## 2014-05-25 HISTORY — PX: ABDOMINAL AORTAGRAM: SHX5454

## 2014-05-25 LAB — PROTIME-INR
INR: 1.24 (ref 0.00–1.49)
Prothrombin Time: 15.3 seconds — ABNORMAL HIGH (ref 11.6–15.2)

## 2014-05-25 SURGERY — ABDOMINAL AORTAGRAM
Anesthesia: LOCAL

## 2014-05-25 MED ORDER — LIDOCAINE HCL (PF) 1 % IJ SOLN
INTRAMUSCULAR | Status: AC
  Filled 2014-05-25: qty 30

## 2014-05-25 MED ORDER — ASPIRIN 81 MG PO CHEW: 81.0000 mg | CHEWABLE_TABLET | ORAL | Status: DC

## 2014-05-25 MED ORDER — SODIUM CHLORIDE 0.9 % IV SOLN
INTRAVENOUS | Status: DC
  Administered 2014-05-25: 1000 mL via INTRAVENOUS

## 2014-05-25 MED ORDER — SODIUM CHLORIDE 0.9 % IV SOLN: INTRAVENOUS | Status: DC

## 2014-05-25 MED ORDER — HEPARIN (PORCINE) IN NACL 2-0.9 UNIT/ML-% IJ SOLN
INTRAMUSCULAR | Status: AC
  Filled 2014-05-25: qty 500

## 2014-05-25 MED ORDER — HYDROMORPHONE HCL PF 1 MG/ML IJ SOLN
INTRAMUSCULAR | Status: AC
  Filled 2014-05-25: qty 1

## 2014-05-25 MED ORDER — SODIUM CHLORIDE 0.9 % IJ SOLN: 3.0000 mL | Freq: Two times a day (BID) | INTRAMUSCULAR | Status: DC

## 2014-05-25 MED ORDER — MIDAZOLAM HCL 2 MG/2ML IJ SOLN
INTRAMUSCULAR | Status: AC
  Filled 2014-05-25: qty 2

## 2014-05-25 MED ORDER — FENTANYL CITRATE 0.05 MG/ML IJ SOLN
INTRAMUSCULAR | Status: AC
  Filled 2014-05-25: qty 2

## 2014-05-25 MED ORDER — SODIUM CHLORIDE 0.9 % IV SOLN: 250.0000 mL | INTRAVENOUS | Status: DC | PRN

## 2014-05-25 MED ORDER — SODIUM CHLORIDE 0.9 % IJ SOLN
3.0000 mL | INTRAMUSCULAR | Status: DC | PRN
Start: 2014-05-25 — End: 2014-05-25

## 2014-05-25 NOTE — H&P (Signed)
Primary cardiologist: Dr. Diona Browner.   Electrophysiologist: Dr. Ladona Ridgel.   HPI   [name] [name] is a 53 year old female who is here today for abdominal aortogram, lower extremity runoff and possible angioplasty.  She has known history of ischemic cardiomyopathy with recent ejection fraction of 25-30%, chronic systolic heart failure, well compensated, status post ICD implantation. She has been relatively stable from a cardiac standpoint. She is a former smoker and is not diabetic. She does have antiphospholipid syndrome on life long anticoagulation with warfarin.   She was seen last year for left leg claudication with mildly reduced ABI. ABI on the left side was 0.81. Aortoiliac duplex showed moderate diffuse disease extending from the left common iliac to the external iliac artery. She continued to have worsening symptoms of left buttock and thigh claudication . Thus, she was referred for angiography. Warfarin has been on hold since Sunday. She has been bridged with Lovenox.     Allergies   Allergen  Reactions   .  Penicillins  Anaphylaxis         shock   .  Metoclopramide Hcl         Non responsive   .  Metoprolol         Syncope    .  Statins         Myalgias           Current Outpatient Prescriptions on File Prior to Visit   Medication  Sig  Dispense  Refill   .  aspirin EC 81 MG tablet  Take 81 mg by mouth daily.         .  carvedilol (COREG) 12.5 MG tablet  Take 12.5 mg by mouth 2 (two) times daily with a meal.         .  citalopram (CELEXA) 20 MG tablet  Take 20 mg by mouth daily.          .  cyclobenzaprine (FLEXERIL) 10 MG tablet  Take 10 mg by mouth 3 (three) times daily as needed for muscle spasms.          .  digoxin (LANOXIN) 0.25 MG tablet  Take 0.25 mg by mouth at bedtime.          .  famotidine (PEPCID) 20 MG tablet  Take 20 mg by mouth 2 (two) times daily.           .  fish oil-omega-3 fatty acids 1000 MG capsule  Take 1 g by mouth daily.           .  furosemide (LASIX)  20 MG tablet  Take 20-40 mg by mouth daily. Patient alternates taking 20mg  one day then 40mg  the next day (Patient had 40mg  03/01/14)         .  HYDROcodone-acetaminophen (NORCO) 10-325 MG per tablet  Take 1 tablet by mouth 4 (four) times daily as needed for severe pain. pain         .  ibuprofen (ADVIL,MOTRIN) 200 MG tablet  Take 400 mg by mouth every 6 (six) hours as needed (PAIN). pain         .  isosorbide mononitrate (IMDUR) 30 MG 24 hr tablet  TAKE ONE TABLET BY MOUTH ONCE DAILY   90 tablet   3   .  Multiple Vitamins-Minerals (MULTIVITAMINS THER. W/MINERALS) TABS  Take 1 tablet by mouth daily.           .  nitroGLYCERIN (NITROSTAT) 0.4 MG SL tablet  Place 0.4  mg under the tongue every 5 (five) minutes as needed for chest pain.         .  potassium chloride SA (K-DUR,KLOR-CON) 20 MEQ tablet  Take 20 mEq by mouth daily.         .  promethazine (PHENERGAN) 25 MG tablet  Take 25 mg by mouth every 6 (six) hours as needed for nausea.         Marland Kitchen  warfarin (COUMADIN) 10 MG tablet  Take 5-10 mg by mouth daily. Take 1 tablet on Mon, Wed, Fri, and Sat.  Take 1/2 tablet on Sun, Tues, Thurs.             No current facility-administered medications on file prior to visit.           Past Medical History   Diagnosis  Date   .  History of pneumonia     .  Chronic systolic heart failure     .  Stroke         Total occlusion of the right internal carotid   .  Coronary atherosclerosis of native coronary artery         Severe two vessel disease    .  Cocaine abuse in remission     .  Raynaud's phenomenon     .  Essential hypertension, benign     .  Hyperlipidemia     .  Mixed hyperlipidemia         LVEF 30-35%   .  Anticardiolipin antibody positive         Chronic Coumadin   .  CHF (congestive heart failure)     .  PVD (peripheral vascular disease)             Past Surgical History   Procedure  Laterality  Date   .  Appendectomy       .  Laryngeal polyp excision       .  L1 corpectomy      12/10   .  Cardiac defibrillator placement           St.Jude ICD           Family History   Problem  Relation  Age of Onset   .  Family history unknown: Yes           History       Social History   .  Marital Status:  Single       Spouse Name:  N/A       Number of Children:  N/A   .  Years of Education:  N/A       Occupational History   .  Part-time Runner, broadcasting/film/video         [employer]       Social History Main Topics   .  Smoking status:  Former Smoker       Types:  Cigarettes   .  Smokeless tobacco:  Never Used         Comment: 1 pack a week   .  Alcohol Use:  No   .  Drug Use:  No         Comment: Prior history of cocaine   .  Sexual Activity:  Not on file       Other Topics  Concern   .  Not on file       Social History Narrative   .  No narrative on file  PHYSICAL EXAM     Constitutional: She is oriented to person, place, and time. She appears well-developed and well-nourished. No distress.   HENT: No nasal discharge.   Head: Normocephalic and atraumatic.   Eyes: Pupils are equal and round. No discharge.   Neck: Normal range of motion. Neck supple. No JVD present. No thyromegaly present.   Cardiovascular: Normal rate, regular rhythm, normal heart sounds. Exam reveals no gallop and no friction rub. No murmur heard.   Pulmonary/Chest: Effort normal and breath sounds normal. No stridor. No respiratory distress. She has no wheezes. She has no rales. She exhibits no tenderness.   Abdominal: Soft. Bowel sounds are normal. She exhibits no distension. There is no tenderness. There is no rebound and no guarding.  Musculoskeletal: Normal range of motion. She exhibits no edema and no tenderness.  Neurological: She is alert and oriented to person, place, and time. Coordination normal.   Skin: Skin is warm and dry. No rash noted. She is not diaphoretic. No erythema. No pallor.  Psychiatric: She has a normal mood and affect. Her behavior is normal. Judgment  and thought content normal.   Vascular: Femoral pulses normal on the right side and mildly diminished on the left side. Distal pulses are mildly diminished bilaterally.      ASSESSMENT AND PLAN              Peripheral arterial disease with lifestyle limiting hip and buttock claudication (Rutherford class 3).      She continues to have significant left-sided claudication likely due to known iliac disease. She also has chronic back pain which is not related to PAD but more likely chronic neuropathic pain. She failed medical therapy and a walking program. The plan is to proceed with angiography today. INR is down to 1.24.

## 2014-05-25 NOTE — Discharge Instructions (Signed)
Resume Warfarin tonight Resume Lovenox tomorrow am   Angiography, Care After Refer to this sheet in the next few weeks. These instructions provide you with information on caring for yourself after your procedure. Your health care provider may also give you more specific instructions. Your treatment has been planned according to current medical practices, but problems sometimes occur. Call your health care provider if you have any problems or questions after your procedure.  WHAT TO EXPECT AFTER THE PROCEDURE After your procedure, it is typical to have the following sensations:  Minor discomfort or tenderness and a small bump at the catheter insertion site. The bump should usually decrease in size and tenderness within 1 to 2 weeks.  Any bruising will usually fade within 2 to 4 weeks. HOME CARE INSTRUCTIONS   You may need to keep taking blood thinners if they were prescribed for you. Only take over-the-counter or prescription medicines for pain, fever, or discomfort as directed by your health care provider.  Do not apply powder or lotion to the site.  Do not sit in a bathtub, swimming pool, or whirlpool for 5 to 7 days.  You may shower 24 hours after the procedure. Remove the bandage (dressing) and gently wash the site with plain soap and water. Gently pat the site dry.  Inspect the site at least twice daily.  Limit your activity for the first 48 hours. Do not bend, squat, or lift anything over 20 lb (9 kg) or as directed by your health care provider.  Do not drive home if you are discharged the day of the procedure. Have someone else drive you. Follow instructions about when you can drive or return to work. SEEK MEDICAL CARE IF:  You get lightheaded when standing up.  You have drainage (other than a small amount of blood on the dressing).  You have chills.  You have a fever.  You have redness, warmth, swelling, or pain at the insertion site. SEEK IMMEDIATE MEDICAL CARE IF:    You develop chest pain or shortness of breath, feel faint, or pass out.  You have bleeding, swelling larger than a walnut, or drainage from the catheter insertion site.  You develop pain, discoloration, coldness, or severe bruising in the leg or arm that held the catheter.  You develop bleeding from any other place, such as the bowels. You may see bright red blood in your urine or stools, or your stools may appear black and tarry.  You have heavy bleeding from the site. If this happens, hold pressure on the site. MAKE SURE YOU:  Understand these instructions.  Will watch your condition.  Will get help right away if you are not doing well or get worse. Document Released: 07/04/2005 Document Revised: 08/18/2013 Document Reviewed: 05/10/2013 [facility] Patient Information 2014 [place], [place].

## 2014-05-25 NOTE — CV Procedure (Signed)
PERIPHERAL VASCULAR PROCEDURE  NAME:  [name] [name]   MRN: [id number] DOB:  [birth year]   ADMIT DATE: 05/25/2014  Performing Cardiologist: Iran OuchMuhammad A Porter Nakama Primary Physician: Fredirick MaudlinHAWKINS,EDWARD L, MD   Procedures Performed:  Abdominal Aortic Angiogram with Bi-Iliofemoral Runoff  Selective left lower extremity arterial angiography  Selective right foot extremity arterial angiography  Mynx closure device    Indication(s):   Claudication    Consent: The procedure with Risks/Benefits/Alternatives and Indications was reviewed with the patient .  All questions were answered.  Medications:  Sedation:  3 mg IV Versed, 2 mm IV Dilaided  Contrast:  106 ml  Visipaque   Procedural details: The right groin was prepped, draped, and anesthetized with 1% lidocaine. Using modified Seldinger technique, a 5 French sheath was introduced into the right common femoral artery. A 5 Fr Short Pigtail Catheter was advanced of over a  Versicore wire into the descending Aorta to a level just above the renal arteries. A power injection of 6420ml/sec contrast over 1 sec was performed for Abdominal Aortic Angiography.  The catheter was then pulled back to a level just above the Aortic bifurcation, and a second power injection was performed to evaluate the iliac arteries.   The pigtail catheter was changed over a glide wire for A crossover catheter which was then pulled back the aortic bifurcation and the wire was advanced down the contralateral common iliac artery.  The wire was then advanced to the contralateral common femoral artery, the catheter was exchanged into an end hole straight tip catheter which was advanced over the wire to the common femoral artery. Contralateral second-order lower extremity angiography was performed via power injection of 5 ml / sec contrast for a total of 35 ml.  The catheter was then removed. At this time the injector was directed to the sheath SideArm and ipsilateral artery  angiography was performed via power injection of 5  ml/sec contrast for a total of 35 ml.    Hemostasis was achieved with a  Mynx closure device. The patient tolerated the procedure well with no immediate complications.    Hemodynamics:  Central Aortic Pressure / Mean Aortic Pressure: 131/75  Findings:  Abdominal aorta: Normal in size with no evidence of aneurysm.  There is mild atherosclerosis.  Left renal artery: 20% ostial stenosis.  Right renal artery: The ostium and is not well visualized due to back hardware.  The vessel appeared to be normal.  Celiac artery: Not visualized  Superior mesenteric artery: Not visualized  Right common iliac artery: Normal in size with minor irregularities  Right internal iliac artery: Normal  Right external iliac artery: 20% ostial stenosis.  Right common femoral artery: Normal  Right profunda femoral artery: Normal  Right superficial femoral artery: Diffuse minor irregularities.  Right popliteal artery: Normal  Right tibial peroneal trunk: Normal with three-vessel runoff below the knee  Left common iliac artery:  Normal  Left internal iliac artery: Normal  Left external iliac artery: Normal in size with diffuse 30% stenosis  Left common femoral artery: Normal  Left profunda femoral artery: Normal  Left superficial femoral artery:  Diffuse 20-30% disease throughout the proximal and mid segment.  This daily, there is an 80% stenosis followed by a 70% stenosis.  Left popliteal artery: Minor irregularities  Left tibial peroneal trunk: Normal in size with three-vessel runoff below the knee   Conclusions: 1.  Mild left external iliac artery stenosis 2.  No significant arterial disease involving the right lower extremity  3.  Significant distal left SFA stenosis with three-vessel runoff below the knee.  Recommendations:  Continue medical therapy.  The left SFA stenosis does not explain left back and thigh pain which seems to be  related to previous back surgery.  Left SFA atherectomy and angioplasty can be considered for refractory left calf claudication.  However, most of her symptoms now do not seem to be related to this. Resume Warfarin tonight and Lovenox tomorrow morning.   Iran Ouch, MD, [facility] 05/25/2014 9:39 AM

## 2014-05-27 NOTE — Progress Notes (Signed)
This encounter was created in error - please disregard.

## 2014-05-30 ENCOUNTER — Ambulatory Visit (INDEPENDENT_AMBULATORY_CARE_PROVIDER_SITE_OTHER): Payer: Medicaid Other | Admitting: *Deleted

## 2014-05-30 DIAGNOSIS — Z7901 Long term (current) use of anticoagulants: Secondary | ICD-10-CM

## 2014-05-30 DIAGNOSIS — R894 Abnormal immunological findings in specimens from other organs, systems and tissues: Secondary | ICD-10-CM

## 2014-05-30 DIAGNOSIS — Z5181 Encounter for therapeutic drug level monitoring: Secondary | ICD-10-CM

## 2014-05-30 DIAGNOSIS — I679 Cerebrovascular disease, unspecified: Secondary | ICD-10-CM

## 2014-05-30 LAB — POCT INR: INR: 1.9

## 2014-05-31 ENCOUNTER — Other Ambulatory Visit: Payer: Self-pay | Admitting: Cardiology

## 2014-06-14 ENCOUNTER — Encounter (INDEPENDENT_AMBULATORY_CARE_PROVIDER_SITE_OTHER): Payer: Self-pay | Admitting: *Deleted

## 2014-06-17 ENCOUNTER — Ambulatory Visit (INDEPENDENT_AMBULATORY_CARE_PROVIDER_SITE_OTHER): Payer: Medicaid Other | Admitting: *Deleted

## 2014-06-17 DIAGNOSIS — I679 Cerebrovascular disease, unspecified: Secondary | ICD-10-CM

## 2014-06-17 DIAGNOSIS — R894 Abnormal immunological findings in specimens from other organs, systems and tissues: Secondary | ICD-10-CM

## 2014-06-17 DIAGNOSIS — Z7901 Long term (current) use of anticoagulants: Secondary | ICD-10-CM

## 2014-06-17 DIAGNOSIS — Z5181 Encounter for therapeutic drug level monitoring: Secondary | ICD-10-CM

## 2014-06-17 LAB — POCT INR: INR: 2.2

## 2014-07-06 NOTE — Telephone Encounter (Signed)
done

## 2014-07-12 ENCOUNTER — Ambulatory Visit: Payer: Medicaid Other | Admitting: Urology

## 2014-07-12 ENCOUNTER — Ambulatory Visit (INDEPENDENT_AMBULATORY_CARE_PROVIDER_SITE_OTHER): Payer: Medicaid Other | Admitting: Internal Medicine

## 2014-07-14 ENCOUNTER — Ambulatory Visit (INDEPENDENT_AMBULATORY_CARE_PROVIDER_SITE_OTHER): Payer: Medicaid Other | Admitting: *Deleted

## 2014-07-14 DIAGNOSIS — Z5181 Encounter for therapeutic drug level monitoring: Secondary | ICD-10-CM

## 2014-07-14 DIAGNOSIS — R894 Abnormal immunological findings in specimens from other organs, systems and tissues: Secondary | ICD-10-CM

## 2014-07-14 DIAGNOSIS — I679 Cerebrovascular disease, unspecified: Secondary | ICD-10-CM

## 2014-07-14 DIAGNOSIS — Z7901 Long term (current) use of anticoagulants: Secondary | ICD-10-CM

## 2014-07-14 LAB — POCT INR: INR: 1.7

## 2014-07-28 ENCOUNTER — Ambulatory Visit (INDEPENDENT_AMBULATORY_CARE_PROVIDER_SITE_OTHER): Payer: Medicaid Other | Admitting: *Deleted

## 2014-07-28 DIAGNOSIS — Z95810 Presence of automatic (implantable) cardiac defibrillator: Secondary | ICD-10-CM

## 2014-07-28 DIAGNOSIS — I2589 Other forms of chronic ischemic heart disease: Secondary | ICD-10-CM

## 2014-08-01 ENCOUNTER — Telehealth (INDEPENDENT_AMBULATORY_CARE_PROVIDER_SITE_OTHER): Payer: Self-pay | Admitting: *Deleted

## 2014-08-01 ENCOUNTER — Encounter (INDEPENDENT_AMBULATORY_CARE_PROVIDER_SITE_OTHER): Payer: Self-pay | Admitting: *Deleted

## 2014-08-01 NOTE — Telephone Encounter (Signed)
Darl Pikes No Showed for her apt with Dorene Ar, NP on 07/12/14. A NS letter has been mailed.

## 2014-08-02 ENCOUNTER — Ambulatory Visit (INDEPENDENT_AMBULATORY_CARE_PROVIDER_SITE_OTHER): Payer: Medicaid Other | Admitting: *Deleted

## 2014-08-02 DIAGNOSIS — I679 Cerebrovascular disease, unspecified: Secondary | ICD-10-CM

## 2014-08-02 DIAGNOSIS — Z7901 Long term (current) use of anticoagulants: Secondary | ICD-10-CM

## 2014-08-02 DIAGNOSIS — Z5181 Encounter for therapeutic drug level monitoring: Secondary | ICD-10-CM

## 2014-08-02 DIAGNOSIS — R894 Abnormal immunological findings in specimens from other organs, systems and tissues: Secondary | ICD-10-CM

## 2014-08-02 LAB — POCT INR: INR: 1.5

## 2014-08-03 LAB — MDC_IDC_ENUM_SESS_TYPE_REMOTE
Brady Statistic RA Percent Paced: 0 %
Brady Statistic RV Percent Paced: 0 %
HighPow Impedance: 48 Ohm
Implantable Pulse Generator Serial Number: 504498
Lead Channel Impedance Value: 541 Ohm
Lead Channel Impedance Value: 561 Ohm
Lead Channel Sensing Intrinsic Amplitude: 1.1 mV
Lead Channel Sensing Intrinsic Amplitude: 25 mV
Lead Channel Setting Pacing Amplitude: 2 V
Lead Channel Setting Pacing Amplitude: 2.4 V
Lead Channel Setting Pacing Pulse Width: 0.5 ms
Zone Setting Detection Interval: 300 ms
Zone Setting Detection Interval: 352.9 ms

## 2014-08-05 ENCOUNTER — Encounter: Payer: Self-pay | Admitting: Cardiology

## 2014-08-12 ENCOUNTER — Ambulatory Visit (INDEPENDENT_AMBULATORY_CARE_PROVIDER_SITE_OTHER): Payer: Medicaid Other | Admitting: *Deleted

## 2014-08-12 ENCOUNTER — Encounter: Payer: Self-pay | Admitting: Internal Medicine

## 2014-08-12 DIAGNOSIS — Z7901 Long term (current) use of anticoagulants: Secondary | ICD-10-CM

## 2014-08-12 DIAGNOSIS — I679 Cerebrovascular disease, unspecified: Secondary | ICD-10-CM

## 2014-08-12 DIAGNOSIS — Z5181 Encounter for therapeutic drug level monitoring: Secondary | ICD-10-CM

## 2014-08-12 DIAGNOSIS — R894 Abnormal immunological findings in specimens from other organs, systems and tissues: Secondary | ICD-10-CM

## 2014-08-12 LAB — POCT INR: INR: 2.4

## 2014-08-23 ENCOUNTER — Ambulatory Visit (INDEPENDENT_AMBULATORY_CARE_PROVIDER_SITE_OTHER): Payer: Medicaid Other | Admitting: Urology

## 2014-08-23 DIAGNOSIS — N898 Other specified noninflammatory disorders of vagina: Secondary | ICD-10-CM

## 2014-08-23 DIAGNOSIS — R39198 Other difficulties with micturition: Secondary | ICD-10-CM

## 2014-08-26 ENCOUNTER — Ambulatory Visit (INDEPENDENT_AMBULATORY_CARE_PROVIDER_SITE_OTHER): Payer: Medicaid Other | Admitting: *Deleted

## 2014-08-26 DIAGNOSIS — I679 Cerebrovascular disease, unspecified: Secondary | ICD-10-CM

## 2014-08-26 DIAGNOSIS — R894 Abnormal immunological findings in specimens from other organs, systems and tissues: Secondary | ICD-10-CM

## 2014-08-26 DIAGNOSIS — Z5181 Encounter for therapeutic drug level monitoring: Secondary | ICD-10-CM

## 2014-08-26 DIAGNOSIS — Z7901 Long term (current) use of anticoagulants: Secondary | ICD-10-CM | POA: Diagnosis not present

## 2014-08-26 LAB — POCT INR: INR: 3.5

## 2014-09-13 ENCOUNTER — Other Ambulatory Visit: Payer: Self-pay | Admitting: Neurology

## 2014-09-13 DIAGNOSIS — R4182 Altered mental status, unspecified: Secondary | ICD-10-CM

## 2014-09-16 ENCOUNTER — Ambulatory Visit (INDEPENDENT_AMBULATORY_CARE_PROVIDER_SITE_OTHER): Payer: Medicaid Other | Admitting: *Deleted

## 2014-09-16 DIAGNOSIS — R894 Abnormal immunological findings in specimens from other organs, systems and tissues: Secondary | ICD-10-CM

## 2014-09-16 DIAGNOSIS — Z7901 Long term (current) use of anticoagulants: Secondary | ICD-10-CM | POA: Diagnosis not present

## 2014-09-16 DIAGNOSIS — Z5181 Encounter for therapeutic drug level monitoring: Secondary | ICD-10-CM

## 2014-09-16 DIAGNOSIS — I679 Cerebrovascular disease, unspecified: Secondary | ICD-10-CM

## 2014-09-16 LAB — POCT INR: INR: 4.4

## 2014-09-23 ENCOUNTER — Other Ambulatory Visit (HOSPITAL_COMMUNITY): Payer: Medicaid Other

## 2014-09-30 ENCOUNTER — Ambulatory Visit (HOSPITAL_COMMUNITY): Payer: Medicaid Other

## 2014-10-04 ENCOUNTER — Encounter (INDEPENDENT_AMBULATORY_CARE_PROVIDER_SITE_OTHER): Payer: Self-pay | Admitting: *Deleted

## 2014-10-07 ENCOUNTER — Ambulatory Visit (HOSPITAL_COMMUNITY)
Admission: RE | Admit: 2014-10-07 | Discharge: 2014-10-07 | Disposition: A | Discharge status: Home / Self Care | Payer: Medicaid Other | Source: Ambulatory Visit | Attending: Neurology | Admitting: Neurology

## 2014-10-07 ENCOUNTER — Ambulatory Visit (INDEPENDENT_AMBULATORY_CARE_PROVIDER_SITE_OTHER): Payer: Medicaid Other

## 2014-10-07 DIAGNOSIS — I10 Essential (primary) hypertension: Secondary | ICD-10-CM | POA: Diagnosis not present

## 2014-10-07 DIAGNOSIS — R894 Abnormal immunological findings in specimens from other organs, systems and tissues: Secondary | ICD-10-CM | POA: Diagnosis not present

## 2014-10-07 DIAGNOSIS — Z5181 Encounter for therapeutic drug level monitoring: Secondary | ICD-10-CM

## 2014-10-07 DIAGNOSIS — R4182 Altered mental status, unspecified: Secondary | ICD-10-CM | POA: Insufficient documentation

## 2014-10-07 DIAGNOSIS — I6521 Occlusion and stenosis of right carotid artery: Secondary | ICD-10-CM | POA: Insufficient documentation

## 2014-10-07 DIAGNOSIS — I679 Cerebrovascular disease, unspecified: Secondary | ICD-10-CM

## 2014-10-07 DIAGNOSIS — Z8673 Personal history of transient ischemic attack (TIA), and cerebral infarction without residual deficits: Secondary | ICD-10-CM | POA: Diagnosis not present

## 2014-10-07 DIAGNOSIS — E119 Type 2 diabetes mellitus without complications: Secondary | ICD-10-CM | POA: Diagnosis not present

## 2014-10-07 DIAGNOSIS — Z7901 Long term (current) use of anticoagulants: Secondary | ICD-10-CM

## 2014-10-07 DIAGNOSIS — R413 Other amnesia: Secondary | ICD-10-CM | POA: Insufficient documentation

## 2014-10-07 LAB — POCT INR: INR: 2.2

## 2014-10-27 ENCOUNTER — Ambulatory Visit (INDEPENDENT_AMBULATORY_CARE_PROVIDER_SITE_OTHER): Payer: Medicaid Other | Admitting: Internal Medicine

## 2014-11-01 ENCOUNTER — Encounter: Payer: Self-pay | Admitting: *Deleted

## 2014-11-03 ENCOUNTER — Encounter: Payer: Self-pay | Admitting: Cardiology

## 2014-11-03 ENCOUNTER — Ambulatory Visit (INDEPENDENT_AMBULATORY_CARE_PROVIDER_SITE_OTHER): Payer: Medicaid Other | Admitting: Cardiology

## 2014-11-03 ENCOUNTER — Ambulatory Visit (INDEPENDENT_AMBULATORY_CARE_PROVIDER_SITE_OTHER): Payer: Medicaid Other | Admitting: *Deleted

## 2014-11-03 VITALS — BP 116/68 | HR 61 | Ht 65.0 in | Wt 191.0 lb

## 2014-11-03 DIAGNOSIS — Z23 Encounter for immunization: Secondary | ICD-10-CM

## 2014-11-03 DIAGNOSIS — I679 Cerebrovascular disease, unspecified: Secondary | ICD-10-CM | POA: Diagnosis not present

## 2014-11-03 DIAGNOSIS — R894 Abnormal immunological findings in specimens from other organs, systems and tissues: Secondary | ICD-10-CM | POA: Diagnosis not present

## 2014-11-03 DIAGNOSIS — Z5181 Encounter for therapeutic drug level monitoring: Secondary | ICD-10-CM

## 2014-11-03 DIAGNOSIS — Z7901 Long term (current) use of anticoagulants: Secondary | ICD-10-CM | POA: Diagnosis not present

## 2014-11-03 DIAGNOSIS — I739 Peripheral vascular disease, unspecified: Secondary | ICD-10-CM | POA: Diagnosis not present

## 2014-11-03 DIAGNOSIS — I255 Ischemic cardiomyopathy: Secondary | ICD-10-CM

## 2014-11-03 DIAGNOSIS — I10 Essential (primary) hypertension: Secondary | ICD-10-CM

## 2014-11-03 LAB — POCT INR: INR: 2.2

## 2014-11-03 NOTE — Progress Notes (Signed)
Reason for visit: CAD, cardiomyopathy  Clinical Summary [name] is a 53 y.o.female last seen in March. Interval evaluation by Dr. Kirke CorinArida noted, she underwent peripheral angiography in May of this year showing only mild left external iliac artery stenosis, no significant disease on the right side, distal left SFA stenosis with three-vessel runoff. Medical therapy was recommended.  She reports stable claudication symptoms and also trouble with peripheral neuropathy. Despite this, she is trying to stay active, has been walking dogs. Also trying to lose weight. She is down nearly 10 pounds from May. She does have intermittent angina symptoms, uses nitroglycerin on a stable pattern. She is trying to quit smoking by using E cigarettes.  Last ischemic evaluation was via a Lexiscan Myoview in February 2014 revealing a large area of scar in the apical and anteroseptal distributions, LVEF 28%. Concurrently performed echocardiogram revealed LVEF 25-30% with grade 1 diastolic dysfunction, mild to moderate mitral regurgitation.  Recent carotid Dopplers in October showed chronic occlusion of the proximal RICA, LICA stenosis less than 50%.  Allergies  Allergen Reactions  . Penicillins Anaphylaxis      shock  . Metoclopramide Hcl     Non responsive  . Metoprolol     Syncope   . Statins     Myalgias    Current Outpatient Prescriptions  Medication Sig Dispense Refill  . aspirin EC 81 MG tablet Take 81 mg by mouth daily.    . carvedilol (COREG) 12.5 MG tablet Take 12.5 mg by mouth 2 (two) times daily with a meal.    . citalopram (CELEXA) 20 MG tablet Take 20 mg by mouth daily.     . cyclobenzaprine (FLEXERIL) 10 MG tablet Take 10 mg by mouth 3 (three) times daily as needed for muscle spasms.     . digoxin (LANOXIN) 0.25 MG tablet Take 0.25 mg by mouth at bedtime.     . docusate sodium (COLACE) 100 MG capsule Take 100 mg by mouth at bedtime.    . enoxaparin (LOVENOX) 100 MG/ML injection Inject 100  mg into the skin every 12 (twelve) hours.    . famotidine (PEPCID) 20 MG tablet Take 20 mg by mouth 2 (two) times daily.      . fish oil-omega-3 fatty acids 1000 MG capsule Take 1 g by mouth daily.      . furosemide (LASIX) 20 MG tablet Take 20-40 mg by mouth daily. Patient alternates taking 20mg  one day then 40mg  the next day (Patient had 40mg  03/01/14)    . HYDROcodone-acetaminophen (NORCO) 10-325 MG per tablet Take 1 tablet by mouth 4 (four) times daily as needed for severe pain. pain    . ibuprofen (ADVIL,MOTRIN) 200 MG tablet Take 400 mg by mouth every 6 (six) hours as needed (PAIN). pain    . isosorbide mononitrate (IMDUR) 30 MG 24 hr tablet Take 30 mg by mouth daily.    Marland Kitchen. KLOR-CON M20 20 MEQ tablet TAKE ONE TABLET BY MOUTH ONCE DAILY 90 tablet 1  . Multiple Vitamins-Minerals (MULTIVITAMINS THER. W/MINERALS) TABS Take 1 tablet by mouth daily.      . nitroGLYCERIN (NITROSTAT) 0.4 MG SL tablet Place 0.4 mg under the tongue every 5 (five) minutes as needed for chest pain.    . potassium chloride SA (K-DUR,KLOR-CON) 20 MEQ tablet Take 20 mEq by mouth daily.    . promethazine (PHENERGAN) 25 MG tablet Take 25 mg by mouth every 6 (six) hours as needed for nausea.    Marland Kitchen. warfarin (COUMADIN) 10  MG tablet Take 5-10 mg by mouth daily. Take 1 tablet on Mon, Wed, Fri, and Sat.  Take 1/2 tablet on Sun, Tues, Thurs.     No current facility-administered medications for this visit.    Past Medical History  Diagnosis Date  . History of pneumonia   . Chronic systolic heart failure   . 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 Coronary atherosclerosis of native coronary artery     Severe two vessel disease   . Cocaine abuse in remission   . Raynaud's phenomenon   . Essential hypertension, benign   . Hyperlipidemia   . Mixed hyperlipidemia     LVEF 30-35%  . Anticardiolipin antibody positive     Chronic Coumadin  . CHF (congestive heart failure)   . PVD (peripheral vascular disease)      Past Surgical History  Procedure Laterality Date  . Appendectomy    . Laryngeal polyp excision    . L1 corpectomy   12/10  . Cardiac defibrillator placement      St.Jude ICD    Social History [name] reports that she quit smoking about 13 months ago. Her smoking use included Cigarettes. She started smoking about 36 years ago. She smoked 0.00 packs per day. She has never used smokeless tobacco. [name] reports that she does not drink alcohol.  Review of Systems Complete review of systems negative except as otherwise outlined in the clinical summary and also the following.  Physical Examination Filed Vitals:   11/03/14 1400  BP: 116/68  Pulse: 61   Filed Weights   11/03/14 1400  Weight: 191 lb (86.637 kg)    Overweight woman. Appears comfortable.  HEENT: Conjunctiva and lids are normal, oropharynx clear.  Neck: Supple no loud carotid bruits, very soft bruit on the right.  Lungs: Clear to auscultation without labored breathing.  Cardiac: Regular rate and rhythm, no S3 gallop. PMI indistinct. No loud systolic murmur.  Abdomen: Soft nontender, bowel sounds present.  Extremities: Diminished dorsalis pedis pulses bilaterally. Mild pedal edema.  Musculoskeletal: No kyphosis noted.  Skin: Warm and dry.  Neuropsychiatric: Patient alert and oriented x3, affect is grossly normal.   Problem List and Plan   Cardiomyopathy, ischemic Stable angina symptoms, no heart failure symptoms at this time. Plan is to continue current cardiac regimen. I encouraged her to continue to walk for exercise and work on her diet. Follow-up arranged.  ANTICARDIOLIPIN ANTIBODY SYNDROME Continues on chronic Coumadin.  Peripheral arterial disease Stable claudication. I reviewed Dr. Jari Sportsman evaluation and recommendations. Continue medical therapy.  Essential hypertension, benign Blood pressure is normal today. No change made in regimen.    [name], M.D., F.A.C.C.

## 2014-11-03 NOTE — Assessment & Plan Note (Signed)
Stable claudication. I reviewed Dr. Jari Sportsman evaluation and recommendations. Continue medical therapy.

## 2014-11-03 NOTE — Assessment & Plan Note (Signed)
Stable angina symptoms, no heart failure symptoms at this time. Plan is to continue current cardiac regimen. I encouraged her to continue to walk for exercise and work on her diet. Follow-up arranged.

## 2014-11-03 NOTE — Assessment & Plan Note (Signed)
Continues on chronic Coumadin. 

## 2014-11-03 NOTE — Patient Instructions (Signed)

## 2014-11-03 NOTE — Assessment & Plan Note (Signed)
Blood pressure is normal today. No change made in regimen.

## 2014-11-22 ENCOUNTER — Ambulatory Visit: Payer: Medicaid Other | Admitting: Urology

## 2014-11-26 ENCOUNTER — Other Ambulatory Visit: Payer: Self-pay | Admitting: Cardiology

## 2014-12-01 ENCOUNTER — Ambulatory Visit (INDEPENDENT_AMBULATORY_CARE_PROVIDER_SITE_OTHER): Payer: Medicaid Other | Admitting: *Deleted

## 2014-12-01 DIAGNOSIS — Z5181 Encounter for therapeutic drug level monitoring: Secondary | ICD-10-CM

## 2014-12-01 DIAGNOSIS — R894 Abnormal immunological findings in specimens from other organs, systems and tissues: Secondary | ICD-10-CM

## 2014-12-01 DIAGNOSIS — Z7901 Long term (current) use of anticoagulants: Secondary | ICD-10-CM

## 2014-12-01 DIAGNOSIS — I679 Cerebrovascular disease, unspecified: Secondary | ICD-10-CM

## 2014-12-01 LAB — POCT INR: INR: 1.8

## 2014-12-08 ENCOUNTER — Encounter (HOSPITAL_COMMUNITY): Payer: Self-pay | Admitting: Cardiovascular Disease

## 2014-12-29 ENCOUNTER — Encounter: Payer: Self-pay | Admitting: *Deleted

## 2015-01-10 ENCOUNTER — Ambulatory Visit (INDEPENDENT_AMBULATORY_CARE_PROVIDER_SITE_OTHER): Payer: Medicaid Other | Admitting: *Deleted

## 2015-01-10 DIAGNOSIS — Z5181 Encounter for therapeutic drug level monitoring: Secondary | ICD-10-CM

## 2015-01-10 DIAGNOSIS — Z7901 Long term (current) use of anticoagulants: Secondary | ICD-10-CM

## 2015-01-10 DIAGNOSIS — R894 Abnormal immunological findings in specimens from other organs, systems and tissues: Secondary | ICD-10-CM

## 2015-01-10 DIAGNOSIS — I679 Cerebrovascular disease, unspecified: Secondary | ICD-10-CM

## 2015-01-10 DIAGNOSIS — Z01812 Encounter for preprocedural laboratory examination: Secondary | ICD-10-CM

## 2015-01-10 DIAGNOSIS — I739 Peripheral vascular disease, unspecified: Secondary | ICD-10-CM

## 2015-01-10 LAB — POCT INR: INR: 2

## 2015-01-23 ENCOUNTER — Ambulatory Visit: Payer: Medicaid Other | Admitting: Cardiology

## 2015-01-23 ENCOUNTER — Encounter: Payer: Medicaid Other | Admitting: Internal Medicine

## 2015-02-17 ENCOUNTER — Ambulatory Visit (INDEPENDENT_AMBULATORY_CARE_PROVIDER_SITE_OTHER): Payer: Medicaid Other | Admitting: Cardiology

## 2015-02-17 ENCOUNTER — Ambulatory Visit (INDEPENDENT_AMBULATORY_CARE_PROVIDER_SITE_OTHER): Payer: Medicaid Other | Admitting: Pharmacist

## 2015-02-17 ENCOUNTER — Encounter: Payer: Self-pay | Admitting: Cardiology

## 2015-02-17 VITALS — BP 93/63 | HR 64 | Ht 66.0 in | Wt 189.0 lb

## 2015-02-17 DIAGNOSIS — R42 Dizziness and giddiness: Secondary | ICD-10-CM

## 2015-02-17 DIAGNOSIS — I679 Cerebrovascular disease, unspecified: Secondary | ICD-10-CM

## 2015-02-17 DIAGNOSIS — Z7901 Long term (current) use of anticoagulants: Secondary | ICD-10-CM

## 2015-02-17 DIAGNOSIS — I255 Ischemic cardiomyopathy: Secondary | ICD-10-CM

## 2015-02-17 DIAGNOSIS — R894 Abnormal immunological findings in specimens from other organs, systems and tissues: Secondary | ICD-10-CM

## 2015-02-17 DIAGNOSIS — Z5181 Encounter for therapeutic drug level monitoring: Secondary | ICD-10-CM

## 2015-02-17 DIAGNOSIS — D6859 Other primary thrombophilia: Secondary | ICD-10-CM

## 2015-02-17 DIAGNOSIS — I6521 Occlusion and stenosis of right carotid artery: Secondary | ICD-10-CM

## 2015-02-17 LAB — POCT INR: INR: 5

## 2015-02-17 MED ORDER — FUROSEMIDE 20 MG PO TABS: 20.0000 mg | ORAL_TABLET | Freq: Every day | ORAL | Status: DC

## 2015-02-17 NOTE — Progress Notes (Signed)
Cardiology Office Note  Date: 02/17/2015   ID: [name] [name], DOB 03-03-1961, MRN [id number]  PCP: Fredirick Maudlin, MD  Primary Cardiologist: Nona Dell, MD   Chief Complaint  Patient presents with  . Cardiomyopathy  . Coronary Artery Disease  . Hypercoagulable state    History of Present Illness: [name] [name] is a medically complex 54 y.o. female last seen in November 2015. She presents to discuss interval symptoms. She reports having dizziness, orthostatic mainly, and relatively low blood pressures. This is associated with an effort to lose weight and diet, no major change in her medical regimen. She is also had some intermittent headaches, tingling in her left hand, visual changes on the right side. She has discussed these with her neurologist Dr. Gerilyn Pilgrim. She reports compliance with Coumadin. Her most recent dry Dopplers from October 2015 are noted below.  Today we reviewed her cardiac regimen which includes Coreg, aspirin, Coumadin, Lasix, potassium, and Imdur. She does have angina symptoms intermittently. She does not report any major recurring leg edema, and we discussed the possibility of trying to cut back her Lasix further to see if this might help prevent her blood pressure from dropping. Her last echocardiogram was in 2014, noted below.    Past Medical History  Diagnosis Date  . History of pneumonia   . Chronic systolic heart failure   . 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 Coronary atherosclerosis of native coronary artery     Severe two vessel disease   . Cocaine abuse in remission   . Raynaud's phenomenon   . Essential hypertension, benign   . Mixed hyperlipidemia   . Anticardiolipin antibody positive     Chronic Coumadin  . PVD (peripheral vascular disease)   . Ischemic cardiomyopathy     LVEF 30-35%    Past Surgical History  Procedure Laterality Date  . Appendectomy    . Laryngeal polyp excision    . L1 corpectomy    12/10  . Cardiac defibrillator placement      St.Jude ICD  . Abdominal aortagram N/A 05/25/2014    Procedure: ABDOMINAL Ronny Flurry;  Surgeon: Iran Ouch, MD;  Location: [facility] CATH LAB;  Service: Cardiovascular;  Laterality: N/A;    Current Outpatient Prescriptions  Medication Sig Dispense Refill  . aspirin EC 81 MG tablet Take 81 mg by mouth daily.    . carvedilol (COREG) 12.5 MG tablet Take 12.5 mg by mouth 2 (two) times daily with a meal.    . citalopram (CELEXA) 20 MG tablet Take 20 mg by mouth daily.     . cyclobenzaprine (FLEXERIL) 10 MG tablet Take 10 mg by mouth 3 (three) times daily as needed for muscle spasms.     . digoxin (LANOXIN) 0.25 MG tablet Take 0.25 mg by mouth at bedtime.     . docusate sodium (COLACE) 100 MG capsule Take 100 mg by mouth at bedtime.    . famotidine (PEPCID) 20 MG tablet Take 20 mg by mouth 2 (two) times daily.      Marland Kitchen gabapentin (NEURONTIN) 300 MG capsule Take 300 mg by mouth 2 (two) times daily.    Marland Kitchen HYDROcodone-acetaminophen (NORCO) 10-325 MG per tablet Take 1 tablet by mouth 4 (four) times daily as needed for severe pain. pain    . ibuprofen (ADVIL,MOTRIN) 200 MG tablet Take 400 mg by mouth every 6 (six) hours as needed (PAIN). pain    . isosorbide mononitrate (IMDUR) 30  MG 24 hr tablet Take 30 mg by mouth daily.    . Multiple Vitamins-Minerals (MULTIVITAMINS THER. W/MINERALS) TABS Take 1 tablet by mouth daily.      . nitroGLYCERIN (NITROSTAT) 0.4 MG SL tablet Place 0.4 mg under the tongue every 5 (five) minutes as needed for chest pain.    . potassium chloride SA (K-DUR,KLOR-CON) 20 MEQ tablet Take 20 mEq by mouth daily.    . promethazine (PHENERGAN) 25 MG tablet Take 25 mg by mouth every 6 (six) hours as needed for nausea.    Marland Kitchen warfarin (COUMADIN) 10 MG tablet Take 5-10 mg by mouth daily. Take 1 tablet on Mon, Wed, Fri, and Sat.  Take 1/2 tablet on Sun, Tues, Thurs.    . furosemide (LASIX) 20 MG tablet Take 1 tablet (20 mg total) by mouth daily. 90  tablet 3   No current facility-administered medications for this visit.    Allergies:  Penicillins; Metoclopramide hcl; Metoprolol; and Statins   Social History: The patient  reports that she quit smoking about 16 months ago. Her smoking use included E-cigarettes. She started smoking about 36 years ago. She has never used smokeless tobacco. She reports that she does not drink alcohol or use illicit drugs.   ROS:  Please see the history of present illness. Otherwise, complete review of systems is positive for none.  All other systems are reviewed and negative.    Physical Exam: VS:  BP 93/63 mmHg  Pulse 64  Ht 5\' 6"  (1.676 m)  Wt 189 lb (85.73 kg)  BMI 30.52 kg/m2  SpO2 97%, BMI Body mass index is 30.52 kg/(m^2).  Wt Readings from Last 3 Encounters:  02/17/15 189 lb (85.73 kg)  11/03/14 191 lb (86.637 kg)  05/25/14 200 lb (90.719 kg)     General: Patient appears comfortable at rest. HEENT: Conjunctiva and lids normal, oropharynx clear with moist mucosa. Neck: Supple, no elevated JVP or carotid bruits, no thyromegaly. Lungs: Clear to auscultation, nonlabored breathing at rest. Cardiac: Regular rate and rhythm, no S3 or significant systolic murmur, no pericardial rub. Abdomen: Soft, nontender, no hepatomegaly, bowel sounds present, no guarding or rebound. Extremities: No pitting edema, distal pulses 2+. Skin: Warm and dry. Musculoskeletal: No kyphosis. Neuropsychiatric: Alert and oriented x3, affect grossly appropriate.   ECG: ECG is not ordered today.   Recent Labwork: 03/01/2014: ALT 26; AST 23; Pro B Natriuretic peptide (BNP) 914.5* 05/20/2014: BUN 9; Creatinine 0.70; Hemoglobin 14.0; Platelets 172; Potassium 4.5; Sodium 137   Other Studies Reviewed Today:  1. Lexiscan Myoview in February 2014 revealed a large area of scar in the apical and anteroseptal distributions, LVEF 28%.  2. Echocardiogram 02/02/2013: Study Conclusions  - Left ventricle: The cavity size was  mildly dilated. Wall thickness was increased in a pattern of mild LVH. Systolic function was severely reduced. The estimated ejection fraction was in the range of 25% to 30%. Diffuse hypokinesis. There is akinesis of the basal-mid inferoposterior myocardium. Doppler parameters are consistent with abnormal left ventricular relaxation (grade 1 diastolic dysfunction). Doppler parameters are consistent with high ventricular filling pressure. - Mitral valve: Mild to moderate regurgitation. - Left atrium: The atrium was mildly dilated.  3. Carotid Dopplers 10/07/2014: IMPRESSION: 1. Chronic occlusion of the proximal right ICA. 2. Left carotid bifurcation and proximal ICA plaque resulting in less than 50% diameter stenosis, stable since prior study. The exam does not exclude plaque ulceration or embolization. Continued surveillance recommended.  Assessment and Plan:  1. Intermittent dizziness and low normal  to low blood pressures on current medical regimen, also in the setting of active effort to lose weight. Plan is to reduce her Lasix to 20 mg daily. Reluctant to stop Imdur since she does have intermittent angina, and would also like to keep her on reasonable dose of Coreg with cardiomyopathy. May be able to cut back diuretics further depending on how she does.  2. CAD with ischemic cardiomyopathy. Follow-up echocardiogram will be obtained to reassess LVEF. Last study in 2014 as noted above.  3. Carotid artery disease with known occlusion of the RICA and less than 50% stenosis of the LICA.   4. Anticardiolipin antibody positive with hypercoagulable state, continues on Coumadin.  Current medicines are reviewed at length with the patient today.  The patient does not have concerns regarding medicines.  Orders Placed This Encounter  Procedures  . 2D Echocardiogram without contrast    Disposition: FU with me in 6 weeks.   Signed, [name], MD, [facility] 02/17/2015  10:30 AM    [facility] [address] [place], [place], [place] [address] Phone: [phone]; Fax: [phone]

## 2015-02-17 NOTE — Patient Instructions (Signed)
Your physician recommends that you schedule a follow-up appointment in: 6 weeks with Dr. Diona Browner  Your physician has recommended you make the following change in your medication:   TAKE LASIX 20 MG DAILY  Your physician has requested that you have an echocardiogram. Echocardiography is a painless test that uses sound waves to create images of your heart. It provides your doctor with information about the size and shape of your heart and how well your heart's chambers and valves are working. This procedure takes approximately one hour. There are no restrictions for this procedure.  WE WILL SEND YOUR INR TO COUMADIN NURSE  Thank you for choosing [facility]!!

## 2015-02-21 ENCOUNTER — Other Ambulatory Visit: Payer: Self-pay | Admitting: Cardiology

## 2015-02-21 MED ORDER — CARVEDILOL 12.5 MG PO TABS: 12.5000 mg | ORAL_TABLET | Freq: Two times a day (BID) | ORAL | Status: DC

## 2015-02-21 NOTE — Telephone Encounter (Signed)
carvedilol (COREG) 12.5 MG tablet  Out of medication [facility] in [place] told her that we have not sent back to them?

## 2015-02-22 ENCOUNTER — Other Ambulatory Visit: Payer: Medicaid Other

## 2015-02-22 ENCOUNTER — Other Ambulatory Visit: Payer: Self-pay | Admitting: Cardiology

## 2015-02-22 NOTE — Telephone Encounter (Signed)
Received fax refill request  Rx # [id number] Medication:  Carvedilol 12.5 mg tab Qty 180 Sig:  Take one tablet by mouth twice daily with food  Physician:  Diona Browner

## 2015-02-22 NOTE — Telephone Encounter (Signed)
Carvedilol refilled 02/21/15.

## 2015-02-27 ENCOUNTER — Emergency Department (HOSPITAL_COMMUNITY): Payer: Medicaid Other

## 2015-02-27 ENCOUNTER — Encounter (HOSPITAL_COMMUNITY): Payer: Self-pay | Admitting: *Deleted

## 2015-02-27 ENCOUNTER — Emergency Department (HOSPITAL_COMMUNITY)
Admission: EM | Admit: 2015-02-27 | Discharge: 2015-02-27 | Disposition: A | Discharge status: Home / Self Care | Payer: Medicaid Other | Attending: Emergency Medicine | Admitting: Emergency Medicine

## 2015-02-27 DIAGNOSIS — R51 Headache: Secondary | ICD-10-CM | POA: Diagnosis not present

## 2015-02-27 DIAGNOSIS — Z87891 Personal history of nicotine dependence: Secondary | ICD-10-CM | POA: Insufficient documentation

## 2015-02-27 DIAGNOSIS — Z8639 Personal history of other endocrine, nutritional and metabolic disease: Secondary | ICD-10-CM | POA: Diagnosis not present

## 2015-02-27 DIAGNOSIS — I10 Essential (primary) hypertension: Secondary | ICD-10-CM | POA: Insufficient documentation

## 2015-02-27 DIAGNOSIS — J029 Acute pharyngitis, unspecified: Secondary | ICD-10-CM | POA: Insufficient documentation

## 2015-02-27 DIAGNOSIS — Z7982 Long term (current) use of aspirin: Secondary | ICD-10-CM | POA: Diagnosis not present

## 2015-02-27 DIAGNOSIS — R05 Cough: Secondary | ICD-10-CM | POA: Insufficient documentation

## 2015-02-27 DIAGNOSIS — R519 Headache, unspecified: Secondary | ICD-10-CM

## 2015-02-27 DIAGNOSIS — Z8701 Personal history of pneumonia (recurrent): Secondary | ICD-10-CM | POA: Diagnosis not present

## 2015-02-27 DIAGNOSIS — Z7901 Long term (current) use of anticoagulants: Secondary | ICD-10-CM | POA: Diagnosis not present

## 2015-02-27 DIAGNOSIS — Z880 Allergy status to penicillin: Secondary | ICD-10-CM | POA: Diagnosis not present

## 2015-02-27 DIAGNOSIS — Z7951 Long term (current) use of inhaled steroids: Secondary | ICD-10-CM | POA: Insufficient documentation

## 2015-02-27 DIAGNOSIS — I5022 Chronic systolic (congestive) heart failure: Secondary | ICD-10-CM | POA: Insufficient documentation

## 2015-02-27 DIAGNOSIS — Z79899 Other long term (current) drug therapy: Secondary | ICD-10-CM | POA: Diagnosis not present

## 2015-02-27 DIAGNOSIS — R509 Fever, unspecified: Secondary | ICD-10-CM | POA: Diagnosis present

## 2015-02-27 DIAGNOSIS — I2510 Atherosclerotic heart disease of native coronary artery without angina pectoris: Secondary | ICD-10-CM | POA: Diagnosis not present

## 2015-02-27 DIAGNOSIS — R059 Cough, unspecified: Secondary | ICD-10-CM

## 2015-02-27 LAB — RAPID STREP SCREEN (MED CTR MEBANE ONLY): Streptococcus, Group A Screen (Direct): NEGATIVE

## 2015-02-27 MED ORDER — HYDROCODONE-ACETAMINOPHEN 5-325 MG PO TABS
1.0000 | ORAL_TABLET | Freq: Once | ORAL | Status: AC
  Administered 2015-02-27: 1 via ORAL
  Filled 2015-02-27: qty 1

## 2015-02-27 MED ORDER — HYDROCOD POLST-CHLORPHEN POLST 10-8 MG/5ML PO LQCR
5.0000 mL | Freq: Two times a day (BID) | ORAL | Status: DC
Start: 2015-02-27 — End: 2015-03-01

## 2015-02-27 MED ORDER — ONDANSETRON 4 MG PO TBDP
4.0000 mg | ORAL_TABLET | Freq: Once | ORAL | Status: AC
  Administered 2015-02-27: 4 mg via ORAL
  Filled 2015-02-27: qty 1

## 2015-02-27 MED ORDER — FLUTICASONE PROPIONATE 50 MCG/ACT NA SUSP: NASAL | Status: DC

## 2015-02-27 NOTE — ED Notes (Signed)
Fever, sore throat, nasal congestion l  Headache.

## 2015-02-27 NOTE — ED Provider Notes (Signed)
CSN: [id number]     Arrival date & time 02/27/15  1219 History  This chart was scribed for [name] Porter, MD by Ronney Lion, ED Scribe. This patient was seen in room APA12/APA12 and the patient's care was started at 2:07 PM.    Chief Complaint  Patient presents with  . Fever   The history is provided by the patient. No language interpreter was used.    HPI Comments: ANE METHOD is a 54 y.o. female who presents to the Emergency Department complaining of sore throat with painful swallowing, nasal congestion, and a fever with a peak temperature of 103.5 with onset 4 days ago. She endorses associated soreness around her sinuses, as well as a headache. She complains of associated cough, but tries not to cough because it exacerbates her symptoms. Tylenol temporarily alleviates her fever. Patient had a flu vaccine this year. She denies chest or back pain. Patient has allergies to penicillin, which causes a rash.   Past Medical History  Diagnosis Date  . History of pneumonia   . Chronic systolic heart failure   . 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 Coronary atherosclerosis of native coronary artery     Severe two vessel disease   . Cocaine abuse in remission   . Raynaud's phenomenon   . Essential hypertension, benign   . Mixed hyperlipidemia   . Anticardiolipin antibody positive     Chronic Coumadin  . PVD (peripheral vascular disease)   . Ischemic cardiomyopathy     LVEF 30-35%   Past Surgical History  Procedure Laterality Date  . Appendectomy    . Laryngeal polyp excision    . L1 corpectomy   12/10  . Cardiac defibrillator placement      [employer]ude ICD  . Abdominal aortagram N/A 05/25/2014    Procedure: ABDOMINAL Ronny Flurry;  Surgeon: Iran Ouch, MD;  Location: [facility] CATH LAB;  Service: Cardiovascular;  Laterality: N/A;   Family History  Problem Relation Age of Onset  . Family history unknown: Yes   History  Substance Use Topics  . Smoking status: Former  Smoker    Types: E-cigarettes    Start date: 03/03/1978    Quit date: 10/03/2013  . Smokeless tobacco: Never Used     Comment: still using e-cig - at 0.2mg  now, will go to 0 soon.    . Alcohol Use: No   OB History    No data available     Review of Systems  Constitutional: Positive for fever and chills. Negative for diaphoresis, appetite change and fatigue.  HENT: Positive for congestion and sore throat. Negative for mouth sores and trouble swallowing.   Eyes: Negative for visual disturbance.  Respiratory: Positive for cough. Negative for chest tightness, shortness of breath and wheezing.   Cardiovascular: Negative for chest pain.  Gastrointestinal: Negative for nausea, vomiting, abdominal pain, diarrhea and abdominal distention.  Endocrine: Negative for polydipsia, polyphagia and polyuria.  Genitourinary: Negative for dysuria, frequency and hematuria.  Musculoskeletal: Negative for back pain and gait problem.  Skin: Negative for color change, pallor and rash.  Neurological: Positive for headaches. Negative for dizziness, syncope and light-headedness.  Hematological: Does not bruise/bleed easily.  Psychiatric/Behavioral: Negative for behavioral problems and confusion.      Allergies  Penicillins; Metoclopramide hcl; Metoprolol; and Statins  Home Medications   Prior to Admission medications   Medication Sig Start Date End Date Taking? Authorizing Provider  aspirin EC 81 MG tablet Take  81 mg by mouth daily.   Yes Historical Provider, MD  carvedilol (COREG) 12.5 MG tablet Take 1 tablet (12.5 mg total) by mouth 2 (two) times daily with a meal. 02/21/15  Yes [name], MD  citalopram (CELEXA) 20 MG tablet Take 20 mg by mouth daily.    Yes Historical Provider, MD  cyclobenzaprine (FLEXERIL) 10 MG tablet Take 10 mg by mouth 3 (three) times daily as needed for muscle spasms.    Yes Historical Provider, MD  digoxin (LANOXIN) 0.25 MG tablet Take 0.25 mg by mouth at bedtime.     Yes Historical Provider, MD  docusate sodium (COLACE) 100 MG capsule Take 100 mg by mouth at bedtime.   Yes Historical Provider, MD  famotidine (PEPCID) 20 MG tablet Take 20 mg by mouth 2 (two) times daily.     Yes Historical Provider, MD  furosemide (LASIX) 20 MG tablet Take 1 tablet (20 mg total) by mouth daily. 02/17/15  Yes [name], MD  gabapentin (NEURONTIN) 300 MG capsule Take 300 mg by mouth 2 (two) times daily.   Yes Historical Provider, MD  HYDROcodone-acetaminophen (NORCO) 10-325 MG per tablet Take 1 tablet by mouth 4 (four) times daily as needed for severe pain. pain   Yes Historical Provider, MD  ibuprofen (ADVIL,MOTRIN) 200 MG tablet Take 400 mg by mouth every 6 (six) hours as needed (PAIN). pain   Yes Historical Provider, MD  isosorbide mononitrate (IMDUR) 30 MG 24 hr tablet Take 30 mg by mouth daily.   Yes Historical Provider, MD  Multiple Vitamins-Minerals (MULTIVITAMINS THER. W/MINERALS) TABS Take 1 tablet by mouth daily.     Yes Historical Provider, MD  nitroGLYCERIN (NITROSTAT) 0.4 MG SL tablet Place 0.4 mg under the tongue every 5 (five) minutes as needed for chest pain.   Yes Historical Provider, MD  potassium chloride SA (K-DUR,KLOR-CON) 20 MEQ tablet Take 20 mEq by mouth daily.   Yes Historical Provider, MD  promethazine (PHENERGAN) 25 MG tablet Take 25 mg by mouth every 6 (six) hours as needed for nausea.   Yes Historical Provider, MD  warfarin (COUMADIN) 10 MG tablet Take 5-10 mg by mouth daily. Take 1 tablet on Mon, Wed, Fri, and Sat.  Take 1/2 tablet on Sun, Tues, Thurs.   Yes Historical Provider, MD  chlorpheniramine-HYDROcodone (TUSSIONEX PENNKINETIC ER) 10-8 MG/5ML LQCR Take 5 mLs by mouth every 12 (twelve) hours. 02/27/15   [name] Porter, MD  fluticasone Aleda Grana) 50 MCG/ACT nasal spray 1 spray each nares bid 02/27/15   [name] Porter, MD   BP 100/55 mmHg  Pulse 63  Temp(Src) 99 F (37.2 C) (Oral)  Resp 16  Ht  (1.626 m)  Wt 184 lb (83.462 kg)  BMI 31.57  kg/m2  SpO2 93% Physical Exam  Constitutional: She is oriented to person, place, and time. She appears well-developed and well-nourished. No distress.  HENT:  Head: Normocephalic.  Tonsils are red, no exudate. No asymmetry or abscess.  Eyes: Conjunctivae are normal. Pupils are equal, round, and reactive to light. No scleral icterus.  Neck: Normal range of motion. Neck supple. No thyromegaly present.  Cardiovascular: Normal rate and regular rhythm.  Exam reveals no gallop and no friction rub.   No murmur heard. Pulmonary/Chest: Effort normal and breath sounds normal. No respiratory distress. She has no wheezes. She has no rales.  Abdominal: Soft. Bowel sounds are normal. She exhibits no distension. There is no tenderness. There is no rebound.  Musculoskeletal: Normal range of motion.  Neurological: She is alert and oriented to person, place, and time.  Skin: Skin is warm and dry. No rash noted.  Psychiatric: She has a normal mood and affect. Her behavior is normal.  Nursing note and vitals reviewed.   ED Course  Procedures (including critical care time)  DIAGNOSTIC STUDIES: Oxygen Saturation is 98% on room air, normal by my interpretation.    COORDINATION OF CARE: 2:12 PM - Discussed treatment plan with pt at bedside which includes strep test, pain medication, and possible antibiotics, and pt agreed to plan.   Labs Review Labs Reviewed  RAPID STREP SCREEN  CULTURE, GROUP A STREP    Imaging Review Dg Chest 2 View  02/27/2015   CLINICAL DATA:  Cough, fever, and sore throat. Chest congestion. Chest pain.  EXAM: CHEST  2 VIEW  COMPARISON:  03/01/2014  FINDINGS: AICD in place. Chronic borderline cardiomegaly. Pulmonary vascularity is normal. Lungs are clear. No effusions. Previous thoracolumbar fusion with corpectomy.  IMPRESSION: No acute abnormality.  Chronic borderline cardiomegaly.   Electronically Signed   By: Francene Boyers M.D.   On: 02/27/2015 16:02     EKG  Interpretation   Date/Time:  Monday February 27 2015 15:19:30 EST Ventricular Rate:  60 PR Interval:  172 QRS Duration: 110 QT Interval:  452 QTC Calculation: 452 R Axis:   61 Text Interpretation:  Normal sinus rhythm Septal infarct , age  undetermined Abnormal ECG Confirmed by Fayrene Fearing  MD, Tashanti Dalporto ([id number]) on  02/27/2015 4:04:12 PM      MDM   Final diagnoses:  Cough  Nonintractable headache, unspecified chronicity pattern, unspecified headache type    Patient awake alert. Loculated. Continues to clear lungs. 97% saturation breathing room air. Normal chest x-ray. Essentially for likely viral. Differential diagnoses would include influenza, also sinusitis. Was symptomatic for 3-4 days. Do not feel this is bacterial. Likely what and a Biaxin particular with the patient taking Coumadin. Plan is Flonase, senna, Mucinex at home. Primary care follow-up.   I personally performed the services described in this documentation, which was scribed in my presence. The recorded information has been reviewed and is accurate.     [name] Porter, MD 02/27/15 [phone]

## 2015-02-27 NOTE — ED Notes (Signed)
Pt returned from xray

## 2015-02-27 NOTE — Discharge Instructions (Signed)
Sinus Headache A sinus headache happens when your sinuses become clogged or puffy (swollen). Sinus headaches can be mild or severe. HOME CARE  Take your medicines (antibiotics) as told. Finish them even if you start to feel better.  Only take medicine as told by your doctor.  Use a nose spray if you feel stuffed up (congested). GET HELP RIGHT AWAY IF:  You have a fever.  You have trouble seeing.  You suddenly have pain in your face or head.  You start to twitch or shake (seizure).  You are confused.  You get headaches more than once a week.  Light or sound bothers you.  You feel sick to your stomach (nauseous) or throw up (vomit).  Your headaches do not get better with treatment. MAKE SURE YOU:  Understand these instructions.  Will watch your condition.  Will get help right away if you are not doing well or get worse. Document Released: 04/17/2011 Document Revised: 03/09/2012 Document Reviewed: 04/17/2011 [facility] Patient Information 2015 [place], [place]. This information is not intended to replace advice given to you by your health care provider. Make sure you discuss any questions you have with your health care provider.  Cough, Adult  A cough is a reflex. It helps you clear your throat and airways. A cough can help heal your body. A cough can last 2 or 3 weeks (acute) or may last more than 8 weeks (chronic). Some common causes of a cough can include an infection, allergy, or a cold. HOME CARE  Only take medicine as told by your doctor.  If given, take your medicines (antibiotics) as told. Finish them even if you start to feel better.  Use a cold steam vaporizer or humidifier in your home. This can help loosen thick spit (secretions).  Sleep so you are almost sitting up (semi-upright). Use pillows to do this. This helps reduce coughing.  Rest as needed.  Stop smoking if you smoke. GET HELP RIGHT AWAY IF:  You have yellowish-white fluid (pus) in your thick  spit.  Your cough gets worse.  Your medicine does not reduce coughing, and you are losing sleep.  You cough up blood.  You have trouble breathing.  Your pain gets worse and medicine does not help.  You have a fever. MAKE SURE YOU:   Understand these instructions.  Will watch your condition.  Will get help right away if you are not doing well or get worse. Document Released: 08/29/2011 Document Revised: 05/02/2014 Document Reviewed: 08/29/2011 [facility] Patient Information 2015 [place], [place]. This information is not intended to replace advice given to you by your health care provider. Make sure you discuss any questions you have with your health care provider.

## 2015-02-27 NOTE — ED Notes (Signed)
ED Provider at patient bedside for assessment/update.   

## 2015-02-27 NOTE — ED Notes (Signed)
Transported via wheelchair to xray

## 2015-02-27 NOTE — ED Notes (Signed)
Observed pt with oxygen off, pt sleeping with saturations that drop into the 80's while asleep, aroused pt and encouraged deep breathing with oxygen levels improved into the 90's. Ambulated pt in hallway, noted saturations 95-98%. Notified Dr. Fayrene Fearing of the same

## 2015-02-27 NOTE — ED Notes (Signed)
Xray tech came to get pt for xray, pt stating that she was having chest pain. ekg obtained, notified edp, no new orders

## 2015-03-01 ENCOUNTER — Emergency Department (HOSPITAL_COMMUNITY): Payer: Medicaid Other

## 2015-03-01 ENCOUNTER — Other Ambulatory Visit: Payer: Medicaid Other

## 2015-03-01 ENCOUNTER — Encounter (HOSPITAL_COMMUNITY): Payer: Self-pay | Admitting: Emergency Medicine

## 2015-03-01 ENCOUNTER — Emergency Department (HOSPITAL_COMMUNITY)
Admission: EM | Admit: 2015-03-01 | Discharge: 2015-03-01 | Disposition: A | Discharge status: Home / Self Care | Payer: Medicaid Other | Attending: Emergency Medicine | Admitting: Emergency Medicine

## 2015-03-01 DIAGNOSIS — Z880 Allergy status to penicillin: Secondary | ICD-10-CM | POA: Diagnosis not present

## 2015-03-01 DIAGNOSIS — Z7982 Long term (current) use of aspirin: Secondary | ICD-10-CM | POA: Insufficient documentation

## 2015-03-01 DIAGNOSIS — R0602 Shortness of breath: Secondary | ICD-10-CM

## 2015-03-01 DIAGNOSIS — M791 Myalgia: Secondary | ICD-10-CM | POA: Diagnosis not present

## 2015-03-01 DIAGNOSIS — R509 Fever, unspecified: Secondary | ICD-10-CM | POA: Diagnosis present

## 2015-03-01 DIAGNOSIS — I5022 Chronic systolic (congestive) heart failure: Secondary | ICD-10-CM | POA: Insufficient documentation

## 2015-03-01 DIAGNOSIS — Z8639 Personal history of other endocrine, nutritional and metabolic disease: Secondary | ICD-10-CM | POA: Diagnosis not present

## 2015-03-01 DIAGNOSIS — Z7901 Long term (current) use of anticoagulants: Secondary | ICD-10-CM | POA: Insufficient documentation

## 2015-03-01 DIAGNOSIS — J329 Chronic sinusitis, unspecified: Secondary | ICD-10-CM | POA: Diagnosis not present

## 2015-03-01 DIAGNOSIS — I2510 Atherosclerotic heart disease of native coronary artery without angina pectoris: Secondary | ICD-10-CM | POA: Insufficient documentation

## 2015-03-01 DIAGNOSIS — Z8673 Personal history of transient ischemic attack (TIA), and cerebral infarction without residual deficits: Secondary | ICD-10-CM | POA: Diagnosis not present

## 2015-03-01 DIAGNOSIS — I10 Essential (primary) hypertension: Secondary | ICD-10-CM | POA: Insufficient documentation

## 2015-03-01 DIAGNOSIS — Z87891 Personal history of nicotine dependence: Secondary | ICD-10-CM | POA: Insufficient documentation

## 2015-03-01 DIAGNOSIS — R042 Hemoptysis: Secondary | ICD-10-CM

## 2015-03-01 DIAGNOSIS — Z79899 Other long term (current) drug therapy: Secondary | ICD-10-CM | POA: Insufficient documentation

## 2015-03-01 DIAGNOSIS — Z8701 Personal history of pneumonia (recurrent): Secondary | ICD-10-CM | POA: Diagnosis not present

## 2015-03-01 DIAGNOSIS — H7291 Unspecified perforation of tympanic membrane, right ear: Secondary | ICD-10-CM | POA: Insufficient documentation

## 2015-03-01 HISTORY — DX: Headache, unspecified: R51.9

## 2015-03-01 HISTORY — DX: Headache: R51

## 2015-03-01 LAB — BASIC METABOLIC PANEL
Anion gap: 11 (ref 5–15)
BUN: 9 mg/dL (ref 6–23)
CO2: 30 mmol/L (ref 19–32)
Calcium: 8.8 mg/dL (ref 8.4–10.5)
Chloride: 96 mmol/L (ref 96–112)
Creatinine, Ser: 0.64 mg/dL (ref 0.50–1.10)
GFR calc Af Amer: >90 mL/min (ref >90)
GFR calc non Af Amer: >90 mL/min (ref >90)
Glucose, Bld: 77 mg/dL (ref 70–99)
Potassium: 4.6 mmol/L (ref 3.5–5.1)
Sodium: 137 mmol/L (ref 135–145)

## 2015-03-01 LAB — CBC WITH DIFFERENTIAL/PLATELET
Basophils Absolute: 0.1 10*3/uL (ref 0.0–0.1)
Basophils Relative: 1 % (ref 0–1)
Eosinophils Absolute: 0.4 10*3/uL (ref 0.0–0.7)
Eosinophils Relative: 4 % (ref 0–5)
HCT: 39.3 % (ref 36.0–46.0)
Hemoglobin: 12.6 g/dL (ref 12.0–15.0)
Lymphocytes Relative: 14 % (ref 12–46)
Lymphs Abs: 1.3 10*3/uL (ref 0.7–4.0)
MCH: 28.7 pg (ref 26.0–34.0)
MCHC: 32.1 g/dL (ref 30.0–36.0)
MCV: 89.5 fL (ref 78.0–100.0)
Monocytes Absolute: 0.9 10*3/uL (ref 0.1–1.0)
Monocytes Relative: 9 % (ref 3–12)
Neutro Abs: 6.7 10*3/uL (ref 1.7–7.7)
Neutrophils Relative %: 72 % (ref 43–77)
Platelets: 105 10*3/uL — ABNORMAL LOW (ref 150–400)
RBC: 4.39 MIL/uL (ref 3.87–5.11)
RDW: 13.4 % (ref 11.5–15.5)
Smear Review: DECREASED
WBC: 9.2 10*3/uL (ref 4.0–10.5)

## 2015-03-01 LAB — PROTIME-INR
INR: 2.76 — ABNORMAL HIGH (ref 0.00–1.49)
Prothrombin Time: 29.4 seconds — ABNORMAL HIGH (ref 11.6–15.2)

## 2015-03-01 LAB — BRAIN NATRIURETIC PEPTIDE: B Natriuretic Peptide: 141 pg/mL — ABNORMAL HIGH (ref 0.0–100.0)

## 2015-03-01 LAB — CULTURE, GROUP A STREP: Strep A Culture: NEGATIVE

## 2015-03-01 MED ORDER — METHYLPREDNISOLONE SODIUM SUCC 125 MG IJ SOLR
125.0000 mg | Freq: Once | INTRAMUSCULAR | Status: AC
  Administered 2015-03-01: 125 mg via INTRAVENOUS
  Filled 2015-03-01: qty 2

## 2015-03-01 MED ORDER — HYDROCOD POLST-CHLORPHEN POLST 10-8 MG/5ML PO LQCR
5.0000 mL | Freq: Two times a day (BID) | ORAL | Status: DC
Start: 2015-03-01 — End: 2015-07-26

## 2015-03-01 MED ORDER — LEVOFLOXACIN 750 MG PO TABS
750.0000 mg | ORAL_TABLET | Freq: Once | ORAL | Status: AC
  Administered 2015-03-01: 750 mg via ORAL
  Filled 2015-03-01: qty 1

## 2015-03-01 MED ORDER — LEVOFLOXACIN 500 MG PO TABS: 500.0000 mg | ORAL_TABLET | Freq: Every day | ORAL | Status: DC

## 2015-03-01 NOTE — ED Provider Notes (Signed)
CSN: [id number]     Arrival date & time 03/01/15  1916 History  This chart was scribed for Rolland Porter, MD by Bronson Curb, ED Scribe. This patient was seen in room APA04/APA04 and the patient's care was started at 7:49 PM.   Chief Complaint  Patient presents with  . Fever    The history is provided by the patient. No language interpreter was used.    HPI Comments: [name] [name] is a 54 y.o. female who presents to the Emergency Department complaining of fever with associated persistent, productive cough for the past 4 days. There is associated sore throat, pain with swallowing, generalized body aches, nasal congestion, fever, and chills. She notes her temperature at home has been fluctuating from 98 to 100.1 F. Patient has taken Tylenol approximately 2 hours ago. Patient was seen here 2 days ago for the same. She denies any new chest pain, nausea, or vomiting. Patient is currently on Coumadin.  Past Medical History  Diagnosis Date  . History of pneumonia   . Chronic systolic heart failure   . 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 Coronary atherosclerosis of native coronary artery     Severe two vessel disease   . Cocaine abuse in remission   . Raynaud's phenomenon   . Essential hypertension, benign   . Mixed hyperlipidemia   . Anticardiolipin antibody positive     Chronic Coumadin  . PVD (peripheral vascular disease)   . Ischemic cardiomyopathy     LVEF 30-35%  . Headache   . Paresthesias   . Visual changes   . Dizziness    Past Surgical History  Procedure Laterality Date  . Appendectomy    . Laryngeal polyp excision    . L1 corpectomy   12/10  . Cardiac defibrillator placement      [employer] ICD  . Abdominal aortagram N/A 05/25/2014    Procedure: ABDOMINAL Ronny Flurry;  Surgeon: Iran Ouch, MD;  Location: [facility] CATH LAB;  Service: Cardiovascular;  Laterality: N/A;   Family History  Problem Relation Age of Onset  . Family history unknown: Yes    History  Substance Use Topics  . Smoking status: Former Smoker    Types: E-cigarettes    Start date: 03/03/1978    Quit date: 10/03/2013  . Smokeless tobacco: Never Used     Comment: still using e-cig - at 0.2mg  now, will go to 0 soon.    . Alcohol Use: No   OB History    No data available     Review of Systems  Constitutional: Positive for fever and chills. Negative for diaphoresis, appetite change and fatigue.  HENT: Positive for congestion and sore throat. Negative for mouth sores and trouble swallowing.   Eyes: Negative for visual disturbance.  Respiratory: Positive for cough. Negative for chest tightness, shortness of breath and wheezing.   Cardiovascular: Negative for chest pain.  Gastrointestinal: Negative for nausea, vomiting, abdominal pain, diarrhea and abdominal distention.  Endocrine: Negative for polydipsia, polyphagia and polyuria.  Genitourinary: Negative for dysuria, frequency and hematuria.  Musculoskeletal: Positive for myalgias. Negative for gait problem.  Skin: Negative for color change, pallor and rash.  Neurological: Negative for dizziness, syncope, light-headedness and headaches.  Hematological: Does not bruise/bleed easily.  Psychiatric/Behavioral: Negative for behavioral problems and confusion.      Allergies  Penicillins; Metoclopramide hcl; Metoprolol; and Statins  Home Medications   Prior to Admission medications   Medication Sig Start Date  End Date Taking? Authorizing Provider  aspirin EC 81 MG tablet Take 81 mg by mouth daily.    Historical Provider, MD  carvedilol (COREG) 12.5 MG tablet Take 1 tablet (12.5 mg total) by mouth 2 (two) times daily with a meal. 02/21/15   [name], MD  chlorpheniramine-HYDROcodone [facility] PENNKINETIC ER) 10-8 MG/5ML LQCR Take 5 mLs by mouth every 12 (twelve) hours. 03/01/15   Rolland Porter, MD  citalopram (CELEXA) 20 MG tablet Take 20 mg by mouth daily.     Historical Provider, MD  cyclobenzaprine  (FLEXERIL) 10 MG tablet Take 10 mg by mouth 3 (three) times daily as needed for muscle spasms.     Historical Provider, MD  digoxin (LANOXIN) 0.25 MG tablet Take 0.25 mg by mouth at bedtime.     Historical Provider, MD  docusate sodium (COLACE) 100 MG capsule Take 100 mg by mouth at bedtime.    Historical Provider, MD  famotidine (PEPCID) 20 MG tablet Take 20 mg by mouth 2 (two) times daily.      Historical Provider, MD  fluticasone Aleda Grana) 50 MCG/ACT nasal spray 1 spray each nares bid 02/27/15   Rolland Porter, MD  furosemide (LASIX) 20 MG tablet Take 1 tablet (20 mg total) by mouth daily. 02/17/15   [name], MD  gabapentin (NEURONTIN) 300 MG capsule Take 300 mg by mouth 2 (two) times daily.    Historical Provider, MD  HYDROcodone-acetaminophen (NORCO) 10-325 MG per tablet Take 1 tablet by mouth 4 (four) times daily as needed for severe pain. pain    Historical Provider, MD  ibuprofen (ADVIL,MOTRIN) 200 MG tablet Take 400 mg by mouth every 6 (six) hours as needed (PAIN). pain    Historical Provider, MD  isosorbide mononitrate (IMDUR) 30 MG 24 hr tablet Take 30 mg by mouth daily.    Historical Provider, MD  levofloxacin (LEVAQUIN) 500 MG tablet Take 1 tablet (500 mg total) by mouth daily. 03/01/15   Rolland Porter, MD  Multiple Vitamins-Minerals (MULTIVITAMINS THER. W/MINERALS) TABS Take 1 tablet by mouth daily.      Historical Provider, MD  nitroGLYCERIN (NITROSTAT) 0.4 MG SL tablet Place 0.4 mg under the tongue every 5 (five) minutes as needed for chest pain.    Historical Provider, MD  potassium chloride SA (K-DUR,KLOR-CON) 20 MEQ tablet Take 20 mEq by mouth daily.    Historical Provider, MD  promethazine (PHENERGAN) 25 MG tablet Take 25 mg by mouth every 6 (six) hours as needed for nausea.    Historical Provider, MD  warfarin (COUMADIN) 10 MG tablet Take 5-10 mg by mouth daily. Take 1 tablet on Mon, Wed, Fri, and Sat.  Take 1/2 tablet on Sun, Tues, Thurs.    Historical Provider, MD   Triage  Vitals: BP 136/82 mmHg  Pulse 77  Temp(Src) 98.4 F (36.9 C)  Resp 18  Ht 6' 6.5" (1.994 m)  Wt 184 lb (83.462 kg)  BMI 20.99 kg/m2  SpO2 99%  Physical Exam  Constitutional: She is oriented to person, place, and time. She appears well-developed and well-nourished. No distress.  HENT:  Head: Normocephalic.  Right Ear: Tympanic membrane is perforated and bulging.  Right TM is bulging and has a small perforation.  Eyes: Conjunctivae are normal. Pupils are equal, round, and reactive to light. No scleral icterus.  Neck: Normal range of motion. Neck supple. No thyromegaly present.  Cardiovascular: Normal rate and regular rhythm.  Exam reveals no gallop and no friction rub.   No murmur heard.  Pulmonary/Chest: Effort normal and breath sounds normal. No respiratory distress. She has no wheezes. She has no rales.  Abdominal: Soft. Bowel sounds are normal. She exhibits no distension. There is no tenderness. There is no rebound.  Musculoskeletal: Normal range of motion.  Neurological: She is alert and oriented to person, place, and time.  Skin: Skin is warm and dry. No rash noted.  Psychiatric: She has a normal mood and affect. Her behavior is normal.  Nursing note and vitals reviewed.   ED Course  Procedures (including critical care time)  DIAGNOSTIC STUDIES: Oxygen Saturation is 99% on room air, normal by my interpretation.    COORDINATION OF CARE: At 1954 Discussed treatment plan with patient which includes imaging. Patient agrees.   Labs Review Labs Reviewed  CBC WITH DIFFERENTIAL/PLATELET - Abnormal; Notable for the following:    Platelets 105 (*)    All other components within normal limits  PROTIME-INR - Abnormal; Notable for the following:    Prothrombin Time 29.4 (*)    INR 2.76 (*)    All other components within normal limits  BRAIN NATRIURETIC PEPTIDE - Abnormal; Notable for the following:    B Natriuretic Peptide 141.0 (*)    All other components within normal limits   BASIC METABOLIC PANEL    Imaging Review Dg Chest 2 View  03/01/2015   CLINICAL DATA:  Acute onset of fever and cough. Hemoptysis. Initial encounter.  EXAM: CHEST  2 VIEW  COMPARISON:  Chest radiograph performed earlier today at 8:06 p.m.  FINDINGS: The lungs are well-aerated. Mild peribronchial thickening is noted. There is no evidence of focal opacification, pleural effusion or pneumothorax.  The heart is borderline normal in size. A pacemaker/AICD is noted at the left chest wall, with leads ending at the right atrium and right ventricle. Lumbar spinal fusion hardware is partially imaged. No acute osseous abnormalities are seen.  IMPRESSION: Mild peribronchial thickening noted; lungs remain grossly clear.   Electronically Signed   By: Roanna Raider M.D.   On: 03/01/2015 21:10   Dg Chest Port 1 View  03/01/2015   CLINICAL DATA:  Fever and cough. Patient diagnosed with influenza 1 week ago. Symptoms have worsened today.  EXAM: PORTABLE CHEST - 1 VIEW  COMPARISON:  PA and lateral chest 02/27/2015.  FINDINGS: AICD remains in place. There is cardiomegaly without edema. No consolidative process, pneumothorax or effusion.  IMPRESSION: Cardiomegaly without acute disease   Electronically Signed   By: Drusilla Kanner M.D.   On: 03/01/2015 20:28     EKG Interpretation None      MDM   Final diagnoses:  Hemoptysis  SOB (shortness of breath)  Sinusitis, unspecified chronicity, unspecified location   Patient with repeat x-ray today that shows no pneumonia. She has no abnormal breath sounds a suggest pneumonia. She is not hypoxemic, febrile, tachypneic, tachycardic, or dyspneic. Majority of her symptoms seem to relate to facial discomfort and bloody nasal secretions. Think her cough and hemoptysis every likely secondary to postnasal secretions.  Previous visit elected not treated with antibiotics taking this is likely viral. This still may be viral. However with persisting worsening symptoms will add  antibiotics. INR 2.76 and she has fever thus I do not think this represents pulmonary embolus. Have asked her to decrease her Coumadin to one half her daily dose after 24 hours of her antibiotic. She has an INR check scheduled for Monday.  i personally performed the services described in this documentation, which was scribed in my presence.  The recorded information has been reviewed and is accurate.    Rolland Porter, MD 03/01/15 2151

## 2015-03-01 NOTE — ED Notes (Signed)
Pt c/o fever, cough and states she coughed up blood.

## 2015-03-01 NOTE — Discharge Instructions (Signed)

## 2015-03-15 ENCOUNTER — Other Ambulatory Visit: Payer: Medicaid Other

## 2015-03-16 ENCOUNTER — Other Ambulatory Visit: Payer: Medicaid Other

## 2015-03-20 ENCOUNTER — Other Ambulatory Visit: Payer: Self-pay | Admitting: Adult Health

## 2015-03-21 ENCOUNTER — Ambulatory Visit (INDEPENDENT_AMBULATORY_CARE_PROVIDER_SITE_OTHER): Payer: Medicaid Other | Admitting: *Deleted

## 2015-03-21 DIAGNOSIS — I679 Cerebrovascular disease, unspecified: Secondary | ICD-10-CM

## 2015-03-21 DIAGNOSIS — Z5181 Encounter for therapeutic drug level monitoring: Secondary | ICD-10-CM | POA: Diagnosis not present

## 2015-03-21 DIAGNOSIS — Z7901 Long term (current) use of anticoagulants: Secondary | ICD-10-CM

## 2015-03-21 DIAGNOSIS — R894 Abnormal immunological findings in specimens from other organs, systems and tissues: Secondary | ICD-10-CM

## 2015-03-21 LAB — POCT INR: INR: 2.7

## 2015-03-29 ENCOUNTER — Other Ambulatory Visit: Payer: Medicaid Other

## 2015-04-03 ENCOUNTER — Ambulatory Visit (INDEPENDENT_AMBULATORY_CARE_PROVIDER_SITE_OTHER): Payer: Medicaid Other | Admitting: *Deleted

## 2015-04-03 ENCOUNTER — Telehealth: Payer: Self-pay | Admitting: *Deleted

## 2015-04-03 DIAGNOSIS — Z5181 Encounter for therapeutic drug level monitoring: Secondary | ICD-10-CM | POA: Diagnosis not present

## 2015-04-03 DIAGNOSIS — Z7901 Long term (current) use of anticoagulants: Secondary | ICD-10-CM

## 2015-04-03 DIAGNOSIS — R894 Abnormal immunological findings in specimens from other organs, systems and tissues: Secondary | ICD-10-CM

## 2015-04-03 DIAGNOSIS — I679 Cerebrovascular disease, unspecified: Secondary | ICD-10-CM

## 2015-04-03 LAB — POCT INR: INR: 1.6

## 2015-04-03 NOTE — Telephone Encounter (Signed)
Pt has appt with Dr. Diona Browner tomorrow, and asking for INR check today states she was in ED and still taking lovenox. Will forward to coumadin nurse.

## 2015-04-03 NOTE — Telephone Encounter (Signed)
INR was checked today by nurse in [place] and I gave pt instructions.  (see coumadin note)  Will recheck INR again tomorrow at Dr Diona Browner appt.

## 2015-04-04 ENCOUNTER — Ambulatory Visit (INDEPENDENT_AMBULATORY_CARE_PROVIDER_SITE_OTHER): Payer: Medicaid Other | Admitting: *Deleted

## 2015-04-04 ENCOUNTER — Ambulatory Visit (INDEPENDENT_AMBULATORY_CARE_PROVIDER_SITE_OTHER): Payer: Medicaid Other | Admitting: Cardiology

## 2015-04-04 ENCOUNTER — Encounter: Payer: Self-pay | Admitting: Cardiology

## 2015-04-04 VITALS — BP 138/80 | HR 70 | Ht 66.0 in | Wt 186.0 lb

## 2015-04-04 DIAGNOSIS — Z95810 Presence of automatic (implantable) cardiac defibrillator: Secondary | ICD-10-CM

## 2015-04-04 DIAGNOSIS — I10 Essential (primary) hypertension: Secondary | ICD-10-CM

## 2015-04-04 DIAGNOSIS — I255 Ischemic cardiomyopathy: Secondary | ICD-10-CM

## 2015-04-04 DIAGNOSIS — Z7901 Long term (current) use of anticoagulants: Secondary | ICD-10-CM

## 2015-04-04 DIAGNOSIS — I679 Cerebrovascular disease, unspecified: Secondary | ICD-10-CM

## 2015-04-04 DIAGNOSIS — D6859 Other primary thrombophilia: Secondary | ICD-10-CM

## 2015-04-04 DIAGNOSIS — Z5181 Encounter for therapeutic drug level monitoring: Secondary | ICD-10-CM

## 2015-04-04 DIAGNOSIS — I5022 Chronic systolic (congestive) heart failure: Secondary | ICD-10-CM

## 2015-04-04 DIAGNOSIS — R894 Abnormal immunological findings in specimens from other organs, systems and tissues: Secondary | ICD-10-CM

## 2015-04-04 LAB — POCT INR: INR: 1.5

## 2015-04-04 MED ORDER — FUROSEMIDE 40 MG PO TABS: 40.0000 mg | ORAL_TABLET | Freq: Every day | ORAL | Status: DC

## 2015-04-04 MED ORDER — SPIRONOLACTONE 25 MG PO TABS: 25.0000 mg | ORAL_TABLET | Freq: Every day | ORAL | Status: DC

## 2015-04-04 MED ORDER — SPIRONOLACTONE 25 MG PO TABS: 12.5000 mg | ORAL_TABLET | Freq: Every day | ORAL | Status: DC

## 2015-04-04 NOTE — Patient Instructions (Addendum)
Your physician recommends that you schedule a follow-up appointment in: 2-3 weeks. Your physician has recommended you make the following change in your medication:  Stop digoxin. Change furosemide to 40 mg daily. You may take (2) of your 20 mg daily until they are finished. Start spironolactone 12.5 mg daily. Continue all other medications the same. Your physician recommends that you have lab work in 2-3 weeks just before your next visit to check your BMET. Your physician recommends that you weigh, daily, at the same time every day, and in the same amount of clothing. Please record your daily weights on the handout provided and bring it to your next appointment. Your physician has requested that you regularly monitor and record your blood pressure readings at home. Please use the same machine at the same time of day to check your readings and record them to bring to your follow-up visit. Please see Dr. Ladona Ridgel as soon as possible.

## 2015-04-04 NOTE — Progress Notes (Signed)
Cardiology Office Note  Date: 04/04/2015   ID: [name] [name], DOB [birth year], MRN [id number]  PCP: [name] Maudlin, MD  Primary Cardiologist: [name] Dell, MD   Chief Complaint  Patient presents with  . Hospitalization Follow-up  . Cardiomyopathy    History of Present Illness: [name] [name] is a medically complex 54 y.o. female last seen in February 2016. At the last visit she was complaining of intermittent dizziness and low normal to low systolic blood pressure, Lasix dose was cut back, she was also working on trying to lose weight through diet. Follow-up echocardiogram was recommended, although not obtained.  Records indicate that she was recently admitted to [facility] in [place] with fairly acute shortness of breath and atypical chest pain. She was treated for acute on chronic systolic heart failure with IV Lasix and had minimal elevation in troponin I not suggestive of ACS. She underwent myocardial perfusion imaging which showed no active ischemia with fixed defects in the anterolateral and apical regions, LVEF 16%. Echocardiogram reported LVEF of approximately 20% with inferior, posterior, and apical akinesis, mild LVH with mild chamber dilatation, normal RV contraction, moderate left atrial enlargement, device wire present in the right heart, moderate mitral regurgitation, mild tricuspid regurgitation, RVSP not calculated. Lab work is noted below.  She comes in today for a follow-up visit. I reviewed her records, discussed test results with her. She has not had problems with significant volume overload until this point, possibly associated with the reduction in Lasix, although she does state that she also "drinks a lot of water and [facility]." We discussed limiting her 24 hour fluid intake to less than 2 L.  We also reviewed her medications. In the past she was not able to tolerate an ACE inhibitor even at very low-dose due to symptomatic hypotension. Blood pressure  trend lately has been increasing. We discussed changes in her current treatment and plan for additional modifications depending on how she does.  She did not have a recent device discharge based on record review. She has not had regular interrogations.   Past Medical History  Diagnosis Date  . History of pneumonia   . Chronic systolic heart failure   . 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 Coronary atherosclerosis of native coronary artery     Severe two vessel disease   . Cocaine abuse in remission   . Raynaud's phenomenon   . Essential hypertension, benign   . Mixed hyperlipidemia   . Anticardiolipin antibody positive     Chronic Coumadin  . PVD (peripheral vascular disease)   . Ischemic cardiomyopathy     LVEF 30-35%  . Headache   . Paresthesias   . Visual changes   . Dizziness     Past Surgical History  Procedure Laterality Date  . Appendectomy    . Laryngeal polyp excision    . L1 corpectomy   12/10  . Cardiac defibrillator placement      St.Jude ICD  . Abdominal aortagram N/A 05/25/2014    Procedure: ABDOMINAL Ronny Flurry;  Surgeon: [name] Ouch, MD;  Location: [facility] CATH LAB;  Service: Cardiovascular;  Laterality: N/A;    Current Outpatient Prescriptions  Medication Sig Dispense Refill  . aspirin EC 81 MG tablet Take 81 mg by mouth daily.    . carvedilol (COREG) 12.5 MG tablet Take 1 tablet (12.5 mg total) by mouth 2 (two) times daily with a meal. 90 tablet 3  . chlorpheniramine-HYDROcodone (  TUSSIONEX PENNKINETIC ER) 10-8 MG/5ML LQCR Take 5 mLs by mouth every 12 (twelve) hours. 60 mL 0  . citalopram (CELEXA) 20 MG tablet Take 20 mg by mouth daily.     . cyclobenzaprine (FLEXERIL) 10 MG tablet Take 10 mg by mouth 3 (three) times daily as needed for muscle spasms.     Marland Kitchen docusate sodium (COLACE) 100 MG capsule Take 100 mg by mouth at bedtime.    . famotidine (PEPCID) 20 MG tablet Take 20 mg by mouth 2 (two) times daily.      . fluticasone  (FLONASE) 50 MCG/ACT nasal spray 1 spray each nares bid 10 g 1  . gabapentin (NEURONTIN) 300 MG capsule Take 300 mg by mouth 2 (two) times daily.    Marland Kitchen HYDROcodone-acetaminophen (NORCO) 10-325 MG per tablet Take 1 tablet by mouth 4 (four) times daily as needed for severe pain. pain    . ibuprofen (ADVIL,MOTRIN) 200 MG tablet Take 400 mg by mouth every 6 (six) hours as needed (PAIN). pain    . isosorbide mononitrate (IMDUR) 30 MG 24 hr tablet TAKE 1 TABLET BY MOUTH DAILY 90 tablet 0  . levofloxacin (LEVAQUIN) 500 MG tablet Take 1 tablet (500 mg total) by mouth daily. 10 tablet 0  . Multiple Vitamins-Minerals (MULTIVITAMINS THER. W/MINERALS) TABS Take 1 tablet by mouth daily.      . nitroGLYCERIN (NITROSTAT) 0.4 MG SL tablet Place 0.4 mg under the tongue every 5 (five) minutes as needed for chest pain.    . potassium chloride SA (K-DUR,KLOR-CON) 20 MEQ tablet Take 20 mEq by mouth daily.    Marland Kitchen warfarin (COUMADIN) 10 MG tablet Take 5-10 mg by mouth daily. Take 1 tablet on Mon, Wed, Fri, and Sat.  Take 1/2 tablet on Sun, Tues, Thurs.    . furosemide (LASIX) 40 MG tablet Take 1 tablet (40 mg total) by mouth daily. 90 tablet 1  . promethazine (PHENERGAN) 25 MG tablet Take 25 mg by mouth every 6 (six) hours as needed for nausea.    Marland Kitchen spironolactone (ALDACTONE) 25 MG tablet Take 0.5 tablets (12.5 mg total) by mouth daily. 90 tablet 3   No current facility-administered medications for this visit.    Allergies:  Penicillins; Metoclopramide hcl; Metoprolol; and Statins   Social History: The patient  reports that she quit smoking about 18 months ago. Her smoking use included E-cigarettes. She started smoking about 37 years ago. She has never used smokeless tobacco. She reports that she does not drink alcohol or use illicit drugs.   ROS:  Please see the history of present illness. Otherwise, complete review of systems is positive for mild ankle edema.  All other systems are reviewed and negative.   Physical  Exam: VS:  BP 138/80 mmHg  Pulse 70  Ht 5\' 6"  (1.676 m)  Wt 186 lb (84.369 kg)  BMI 30.04 kg/m2  SpO2 99%, BMI Body mass index is 30.04 kg/(m^2).  Wt Readings from Last 3 Encounters:  04/04/15 186 lb (84.369 kg)  03/01/15 184 lb (83.462 kg)  02/27/15 184 lb (83.462 kg)     General: Overweight woman, appears comfortable at rest. HEENT: Conjunctiva and lids normal, oropharynx clear. Neck: Supple, JVP approximately 10 cm water, no thyromegaly. Lungs: Clear to auscultation decreased at the bases, nonlabored breathing at rest. Cardiac: Indistinct PMI, regular rate and rhythm, no S3, no pericardial rub. Abdomen: Soft but protuberant, nontender, bowel sounds present, no guarding or rebound. Extremities: Trace ankle edema, distal pulses 2+. Skin: Warm and  dry. Musculoskeletal: No kyphosis. Neuropsychiatric: Alert and oriented x3, affect grossly appropriate.   ECG: ECG is ordered today. Recent outside tracings reviewed.   Recent Labwork: 03/01/2015: B Natriuretic Peptide 141.0*; BUN 9; Creatinine 0.64; Hemoglobin 12.6; Platelets 105*; Potassium 4.6; Sodium 137   03/31/2015: potassium 3.8, BUN 12, creatinine 0.7, magnesium 2.8, TSH 0.93, INR 1.7, troponin I 0.05  Other Studies Reviewed Today:  1. Echocardiogram 02/02/2013: Study Conclusions  - Left ventricle: The cavity size was mildly dilated. Wall thickness was increased in a pattern of mild LVH. Systolic function was severely reduced. The estimated ejection fraction was in the range of 25% to 30%. Diffuse hypokinesis. There is akinesis of the basal-mid inferoposterior myocardium. Doppler parameters are consistent with abnormal left ventricular relaxation (grade 1 diastolic dysfunction). Doppler parameters are consistent with high ventricular filling pressure. - Mitral valve: Mild to moderate regurgitation. - Left atrium: The atrium was mildly dilated.  2. Carotid Dopplers 10/07/2014: IMPRESSION: 1. 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 2. Left carotid bifurcation and proximal ICA plaque resulting in less than 50% diameter stenosis, stable since prior study. The exam does not exclude plaque ulceration or embolization. Continued surveillance recommended.  Assessment and Plan:  1. Chronic systolic heart failure, recent hospitalization in [place] related to acute on chronic decompensation with volume overload. She has symptomatically improved with increased diuretic. We have discussed medication adjustments as outlined below. Recommended checking weights daily in the morning, we discussed taking an additional dose of Lasix if she gains 3 or more pounds in 24 hours. Also record blood pressures at home.  2. Ischemic cardiomyopathy, LVEF now approximately 20% by recent follow-up testing. No evidence of ischemia by myocardial perfusion imaging, although fairly large scar burden. Plan is to continue Coreg at current dose, stop digoxin, initiate Aldactone 12.5 mg daily, continue Lasix although at 40 mg daily, and continue remainder of cardiac regimen. I would ultimately like to try adding a low-dose ARB if her blood pressure tolerates.  3. St. Jude ICD in place. No reported recent discharges. She needs to have follow-up in the device clinic, this is being arranged.  4. Anti-cardiolipin antibody positive, on chronic Coumadin. She has prior history of stroke with known occlusion of the RICA.  Current medicines were reviewed with the patient today and adjustments are made as noted above.   Orders Placed This Encounter  Procedures  . Basic metabolic panel    Disposition: FU with me in 2-3 weeks.   Signed, [name] Sidle, MD, [facility] 04/04/2015 1:32 PM    [facility] at [facility] [address] [place], [place], [place] [address] Phone: [phone]; Fax: [phone]

## 2015-04-06 ENCOUNTER — Ambulatory Visit (INDEPENDENT_AMBULATORY_CARE_PROVIDER_SITE_OTHER): Payer: Medicaid Other | Admitting: *Deleted

## 2015-04-06 DIAGNOSIS — Z7901 Long term (current) use of anticoagulants: Secondary | ICD-10-CM | POA: Diagnosis not present

## 2015-04-06 DIAGNOSIS — Z5181 Encounter for therapeutic drug level monitoring: Secondary | ICD-10-CM | POA: Diagnosis not present

## 2015-04-06 DIAGNOSIS — I679 Cerebrovascular disease, unspecified: Secondary | ICD-10-CM | POA: Diagnosis not present

## 2015-04-06 DIAGNOSIS — R894 Abnormal immunological findings in specimens from other organs, systems and tissues: Secondary | ICD-10-CM

## 2015-04-06 LAB — POCT INR: INR: 2.3

## 2015-04-12 ENCOUNTER — Telehealth: Payer: Self-pay | Admitting: Cardiology

## 2015-04-12 ENCOUNTER — Other Ambulatory Visit: Payer: Self-pay

## 2015-04-12 NOTE — Telephone Encounter (Signed)
Patient called back inquiring about earlier call

## 2015-04-12 NOTE — Telephone Encounter (Signed)
Spoke with patient and she is concerned that since stopping her digoxin and starting spironolactone, her HR has increased in the 90's and her BP had decreased in the 80-95/55-65. Patient said she only notices these numbers when she first wakes up in the mornings. Patient said when she checks her HR & BP later in the evening the numbers are better HR in 70's and BP around 107/80. No c/o dizziness, sob. Patient said her weight is back to normal. Nurse advised patient to continue monitoring her BP and HR and MD would be notified.

## 2015-04-12 NOTE — Telephone Encounter (Signed)
[name] [name] called stating that she is concerned about her BP being high.

## 2015-04-13 NOTE — Telephone Encounter (Signed)
Noted. Would continue same for now and continue to monitor vital signs periodically.

## 2015-04-13 NOTE — Telephone Encounter (Signed)
Patient informed after walking into the office stating that she has a headache and was concerned that her HR could be causing it. Nurse advised patient that her pain could be causing her HR to increase. Nurse advised patient to trying taking something to help her headache and if it didn't go away to contact her PCP regarding her headache. Patient verbalized understanding of plan.

## 2015-04-17 ENCOUNTER — Ambulatory Visit (INDEPENDENT_AMBULATORY_CARE_PROVIDER_SITE_OTHER): Payer: Medicaid Other | Admitting: Internal Medicine

## 2015-04-17 ENCOUNTER — Encounter: Payer: Self-pay | Admitting: Internal Medicine

## 2015-04-17 VITALS — BP 112/64 | HR 76 | Ht 66.0 in | Wt 190.0 lb

## 2015-04-17 DIAGNOSIS — I255 Ischemic cardiomyopathy: Secondary | ICD-10-CM

## 2015-04-17 DIAGNOSIS — I10 Essential (primary) hypertension: Secondary | ICD-10-CM | POA: Diagnosis not present

## 2015-04-17 DIAGNOSIS — Z95810 Presence of automatic (implantable) cardiac defibrillator: Secondary | ICD-10-CM | POA: Diagnosis not present

## 2015-04-17 DIAGNOSIS — I739 Peripheral vascular disease, unspecified: Secondary | ICD-10-CM | POA: Diagnosis not present

## 2015-04-17 LAB — MDC_IDC_ENUM_SESS_TYPE_INCLINIC
Battery Voltage: 2.81 V
Brady Statistic RA Percent Paced: 0 %
Brady Statistic RV Percent Paced: 0 %
Date Time Interrogation Session: 20160418040000
HighPow Impedance: 35 Ohm
HighPow Impedance: 46 Ohm
Implantable Pulse Generator Serial Number: 504498
Lead Channel Impedance Value: 547 Ohm
Lead Channel Impedance Value: 568 Ohm
Lead Channel Pacing Threshold Amplitude: 0.6 V
Lead Channel Pacing Threshold Amplitude: 1.2 V
Lead Channel Pacing Threshold Pulse Width: 0.5 ms
Lead Channel Pacing Threshold Pulse Width: 0.5 ms
Lead Channel Sensing Intrinsic Amplitude: 1.4 mV
Lead Channel Sensing Intrinsic Amplitude: 25 mV
Lead Channel Setting Pacing Amplitude: 2 V
Lead Channel Setting Pacing Amplitude: 2.4 V
Lead Channel Setting Pacing Pulse Width: 0.5 ms
Zone Setting Detection Interval: 300 ms
Zone Setting Detection Interval: 353 ms

## 2015-04-17 NOTE — Assessment & Plan Note (Signed)
She has symptoms which are fairly well controlled. I encouraged her to increase her physical activity.

## 2015-04-17 NOTE — Assessment & Plan Note (Signed)
She denies any anginal symptoms. I have encouraged her to increase her physical activity.

## 2015-04-17 NOTE — Assessment & Plan Note (Signed)
Her blood pressure is normal. She will continue her current meds.

## 2015-04-17 NOTE — Patient Instructions (Addendum)
Medication Instructions:  Your physician recommends that you continue on your current medications as directed. Please refer to the Current Medication list given to you today.   Labwork: none  Testing/Procedures: none  Follow-Up: Your physician wants you to follow-up in: 12 months with Dr. Ladona Ridgel. You will receive a reminder letter in the mail two months in advance. If you don't receive a letter, please call our office to schedule the follow-up appointment.     Remote monitoring is used to monitor your Pacemaker of ICD from home. This monitoring reduces the number of office visits required to check your device to one time per year. It allows [place] to keep an eye on the functioning of your device to ensure it is working properly. You are scheduled for a device check from home on July 17, 2015. You may send your transmission at any time that day. If you have a wireless device, the transmission will be sent automatically. After your physician reviews your transmission, you will receive a postcard with your next transmission date.

## 2015-04-17 NOTE — Progress Notes (Signed)
HPI [name] [name] returns today for followup. She is a very pleasant 54 year old woman with an ischemic cardiomyopathy, chronic systolic heart failure, well compensated, status post ICD implantation. The patient has done well in the interim except for a single bout of CHF. She has had no ICD shocks. She denies peripheral edema. She has chronic claudication. Allergies  Allergen Reactions  . Penicillins Anaphylaxis      shock  . Metoclopramide Hcl     Non responsive  . Metoprolol     Syncope   . Statins     Myalgias     Current Outpatient Prescriptions  Medication Sig Dispense Refill  . aspirin EC 81 MG tablet Take 81 mg by mouth daily.    . carvedilol (COREG) 12.5 MG tablet Take 1 tablet (12.5 mg total) by mouth 2 (two) times daily with a meal. 90 tablet 3  . chlorpheniramine-HYDROcodone (TUSSIONEX PENNKINETIC ER) 10-8 MG/5ML LQCR Take 5 mLs by mouth every 12 (twelve) hours. 60 mL 0  . citalopram (CELEXA) 20 MG tablet Take 20 mg by mouth daily.     . cyclobenzaprine (FLEXERIL) 10 MG tablet Take 10 mg by mouth 3 (three) times daily as needed for muscle spasms.     Marland Kitchen docusate sodium (COLACE) 100 MG capsule Take 100 mg by mouth at bedtime.    . famotidine (PEPCID) 20 MG tablet Take 20 mg by mouth 2 (two) times daily.      . fluticasone (FLONASE) 50 MCG/ACT nasal spray 1 spray each nares bid 10 g 1  . furosemide (LASIX) 40 MG tablet Take 1 tablet (40 mg total) by mouth daily. 90 tablet 1  . gabapentin (NEURONTIN) 300 MG capsule Take 300 mg by mouth 2 (two) times daily.    Marland Kitchen HYDROcodone-acetaminophen (NORCO) 10-325 MG per tablet Take 1 tablet by mouth 4 (four) times daily as needed for severe pain. pain    . ibuprofen (ADVIL,MOTRIN) 200 MG tablet Take 400 mg by mouth every 6 (six) hours as needed (PAIN). pain    . isosorbide mononitrate (IMDUR) 30 MG 24 hr tablet TAKE 1 TABLET BY MOUTH DAILY 90 tablet 0  . levofloxacin (LEVAQUIN) 500 MG tablet Take 1 tablet (500 mg total) by mouth daily. 10  tablet 0  . Multiple Vitamins-Minerals (MULTIVITAMINS THER. W/MINERALS) TABS Take 1 tablet by mouth daily.      . nitroGLYCERIN (NITROSTAT) 0.4 MG SL tablet Place 0.4 mg under the tongue every 5 (five) minutes as needed for chest pain.    . potassium chloride SA (K-DUR,KLOR-CON) 20 MEQ tablet Take 20 mEq by mouth daily.    . promethazine (PHENERGAN) 25 MG tablet Take 25 mg by mouth every 6 (six) hours as needed for nausea.    Marland Kitchen spironolactone (ALDACTONE) 25 MG tablet Take 0.5 tablets (12.5 mg total) by mouth daily. 90 tablet 3  . warfarin (COUMADIN) 10 MG tablet Take 5-10 mg by mouth daily. Take 1 tablet on Mon, Wed, Fri, and Sat.  Take 1/2 tablet on Sun, Tues, Thurs.     No current facility-administered medications for this visit.     Past Medical History  Diagnosis Date  . History of pneumonia   . Chronic systolic heart failure   . 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 Coronary atherosclerosis of native coronary artery     Severe two vessel disease   . Cocaine abuse in remission   . Raynaud's phenomenon   . Essential hypertension, benign   .  Mixed hyperlipidemia   . Anticardiolipin antibody positive     Chronic Coumadin  . PVD (peripheral vascular disease)   . Ischemic cardiomyopathy     LVEF 30-35%  . Headache   . Paresthesias   . Visual changes   . Dizziness     ROS:   All systems reviewed and negative except as noted in the HPI.   Past Surgical History  Procedure Laterality Date  . Appendectomy    . Laryngeal polyp excision    . L1 corpectomy   12/10  . Cardiac defibrillator placement      St.Jude ICD  . Abdominal aortagram N/A 05/25/2014    Procedure: ABDOMINAL Ronny Flurry;  Surgeon: Iran Ouch, MD;  Location: [facility] CATH LAB;  Service: Cardiovascular;  Laterality: N/A;     Family History  Problem Relation Age of Onset  . Family history unknown: Yes     History   Social History  . Marital Status: Single    Spouse Name: N/A  .  Number of Children: N/A  . Years of Education: N/A   Occupational History  . 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 Smoking status: Former Smoker    Types: E-cigarettes    Start date: 03/03/1978    Quit date: 10/03/2013  . Smokeless tobacco: Never Used     Comment: still using e-cig - at 0.2mg  now, will go to 0 soon.    . Alcohol Use: No  . Drug Use: No     Comment: Prior history of cocaine  . Sexual Activity: Yes    Birth Control/ Protection: None   Other Topics Concern  . Not on file   Social History Narrative     BP 112/64 mmHg  Pulse 76  Ht  (1.676 m)  Wt 190 lb (86.183 kg)  BMI 30.68 kg/m2  SpO2 99%  Physical Exam:  Well appearing middle-aged woman, NAD HEENT: Unremarkable Neck:  7 cm JVD, no thyromegally Lungs:  Clear with no wheezes, rales, or rhonchi. Well-healed ICD incision. HEART:  Regular rate rhythm, no murmurs, no rubs, no clicks Abd:  soft, positive bowel sounds, no organomegally, no rebound, no guarding Ext:  2 plus pulses, no edema, no cyanosis, no clubbing Skin:  No rashes no nodules Neuro:  CN II through XII intact, motor grossly intact  DEVICE  Normal device function.  See PaceArt for details.   Assess/Plan:

## 2015-04-17 NOTE — Assessment & Plan Note (Signed)
Her [place] Sci ICD is working normally. Will recheck in several months. 

## 2015-04-18 ENCOUNTER — Ambulatory Visit (INDEPENDENT_AMBULATORY_CARE_PROVIDER_SITE_OTHER): Payer: Medicaid Other | Admitting: *Deleted

## 2015-04-18 ENCOUNTER — Encounter: Payer: Self-pay | Admitting: Cardiology

## 2015-04-18 ENCOUNTER — Telehealth: Payer: Self-pay | Admitting: *Deleted

## 2015-04-18 ENCOUNTER — Ambulatory Visit (INDEPENDENT_AMBULATORY_CARE_PROVIDER_SITE_OTHER): Payer: Medicaid Other | Admitting: Cardiology

## 2015-04-18 VITALS — BP 110/64 | HR 70 | Ht 66.0 in | Wt 191.0 lb

## 2015-04-18 DIAGNOSIS — Z7901 Long term (current) use of anticoagulants: Secondary | ICD-10-CM | POA: Diagnosis not present

## 2015-04-18 DIAGNOSIS — I5022 Chronic systolic (congestive) heart failure: Secondary | ICD-10-CM

## 2015-04-18 DIAGNOSIS — R894 Abnormal immunological findings in specimens from other organs, systems and tissues: Secondary | ICD-10-CM

## 2015-04-18 DIAGNOSIS — I679 Cerebrovascular disease, unspecified: Secondary | ICD-10-CM

## 2015-04-18 DIAGNOSIS — I255 Ischemic cardiomyopathy: Secondary | ICD-10-CM

## 2015-04-18 DIAGNOSIS — D6859 Other primary thrombophilia: Secondary | ICD-10-CM

## 2015-04-18 DIAGNOSIS — E785 Hyperlipidemia, unspecified: Secondary | ICD-10-CM

## 2015-04-18 DIAGNOSIS — R0683 Snoring: Secondary | ICD-10-CM

## 2015-04-18 DIAGNOSIS — Z5181 Encounter for therapeutic drug level monitoring: Secondary | ICD-10-CM

## 2015-04-18 LAB — POCT INR: INR: 1.9

## 2015-04-18 NOTE — Progress Notes (Signed)
Cardiology Office Note  Date: 04/18/2015   ID: [name] [name], DOB Jul 31, 1961, MRN [id number]  PCP: Fredirick Maudlin, MD  Primary Cardiologist: Nona Dell, MD   Chief Complaint  Patient presents with  . Cardiomyopathy  . Coronary Artery Disease    History of Present Illness: [name] [name] is a medically complex 54 y.o. female seen recently in early April. Extensive history reviewed in that note. At the last visit we continued Coreg, stopped digoxin, initiated Aldactone, and continued Lasix at 40 mg daily. She was to continue daily weights and track blood pressure. Interval visit with Dr. Ladona Ridgel yesterday for device follow-up, no shocks and overall normal function.  She presents today for a routine follow-up visit, states that she has been doing reasonably well. No progressing shortness of breath or leg edema. We reviewed her home weight checks, there has been recent increased weight but she attributes this to increased caloric intake over the weekend associated with a birthday party. Blood pressure is still low normal to low, limiting the addition of ARB. Heart rate has been somewhat elevated in the mornings, she tells her that she will be undergoing a sleep study arranged by Dr. [name] which I think is a good idea to exclude obstructive sleep apnea. He has used additional Lasix on a few occasions.  She has not presented yet for the follow-up lab work that we arranged.   Past Medical History  Diagnosis Date  . History of pneumonia   . Chronic systolic heart failure   . 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 Coronary atherosclerosis of native coronary artery     Severe two vessel disease   . Cocaine abuse in remission   . Raynaud's phenomenon   . Essential hypertension, benign   . Mixed hyperlipidemia   . Anticardiolipin antibody positive     Chronic Coumadin  . PVD (peripheral vascular disease)   . Ischemic cardiomyopathy     LVEF 30-35%  .  Headache     Past Surgical History  Procedure Laterality Date  . Appendectomy    . Laryngeal polyp excision    . L1 corpectomy   12/10  . Cardiac defibrillator placement      St.Jude ICD  . Abdominal aortagram N/A 05/25/2014    Procedure: ABDOMINAL Ronny Flurry;  Surgeon: Iran Ouch, MD;  Location: [facility] CATH LAB;  Service: Cardiovascular;  Laterality: N/A;    Current Outpatient Prescriptions  Medication Sig Dispense Refill  . aspirin EC 81 MG tablet Take 81 mg by mouth daily.    . B Complex-Folic Acid (B COMPLEX-VITAMIN B12 PO) Take by mouth.    . Calcium Carbonate-Vitamin D (CALCIUM-VITAMIN D3 PO) Take by mouth.    . carvedilol (COREG) 12.5 MG tablet Take 1 tablet (12.5 mg total) by mouth 2 (two) times daily with a meal. 90 tablet 3  . cetirizine (ZYRTEC) 10 MG tablet Take 10 mg by mouth daily.    . chlorpheniramine-HYDROcodone (TUSSIONEX PENNKINETIC ER) 10-8 MG/5ML LQCR Take 5 mLs by mouth every 12 (twelve) hours. 60 mL 0  . citalopram (CELEXA) 20 MG tablet Take 20 mg by mouth daily.     . cyclobenzaprine (FLEXERIL) 10 MG tablet Take 10 mg by mouth 3 (three) times daily as needed for muscle spasms.     Marland Kitchen docusate sodium (COLACE) 100 MG capsule Take 100 mg by mouth at bedtime.    . famotidine (PEPCID) 20 MG tablet Take 20 mg  by mouth 2 (two) times daily.      . fluticasone (FLONASE) 50 MCG/ACT nasal spray 1 spray each nares bid 10 g 1  . furosemide (LASIX) 40 MG tablet Take 1 tablet (40 mg total) by mouth daily. 90 tablet 1  . gabapentin (NEURONTIN) 300 MG capsule Take 300 mg by mouth 2 (two) times daily.    Marland Kitchen HYDROcodone-acetaminophen (NORCO) 10-325 MG per tablet Take 1 tablet by mouth 4 (four) times daily as needed for severe pain. pain    . ibuprofen (ADVIL,MOTRIN) 200 MG tablet Take 400 mg by mouth every 6 (six) hours as needed (PAIN). pain    . isosorbide mononitrate (IMDUR) 30 MG 24 hr tablet TAKE 1 TABLET BY MOUTH DAILY 90 tablet 0  . levofloxacin (LEVAQUIN) 500 MG tablet Take  1 tablet (500 mg total) by mouth daily. 10 tablet 0  . Multiple Vitamins-Minerals (MULTIVITAMINS THER. W/MINERALS) TABS Take 1 tablet by mouth daily.      . nitroGLYCERIN (NITROSTAT) 0.4 MG SL tablet Place 0.4 mg under the tongue every 5 (five) minutes as needed for chest pain.    . potassium chloride SA (K-DUR,KLOR-CON) 20 MEQ tablet Take 20 mEq by mouth daily.    . promethazine (PHENERGAN) 25 MG tablet Take 25 mg by mouth every 6 (six) hours as needed for nausea.    Marland Kitchen spironolactone (ALDACTONE) 25 MG tablet Take 0.5 tablets (12.5 mg total) by mouth daily. 90 tablet 3  . warfarin (COUMADIN) 10 MG tablet Take 5-10 mg by mouth daily. Take 1 tablet on Mon, Wed, Fri, and Sat.  Take 1/2 tablet on Sun, Tues, Thurs.     No current facility-administered medications for this visit.    Allergies:  Penicillins; Metoclopramide hcl; Metoprolol; and Statins   Social History: The patient  reports that she quit smoking about 18 months ago. Her smoking use included E-cigarettes. She started smoking about 37 years ago. She has never used smokeless tobacco. She reports that she does not drink alcohol or use illicit drugs.   ROS:  Please see the history of present illness. Otherwise, complete review of systems is positive for none.  All other systems are reviewed and negative.   Physical Exam: VS:  BP 110/64 mmHg  Pulse 70  Ht 5\' 6"  (1.676 m)  Wt 191 lb (86.637 kg)  BMI 30.84 kg/m2  SpO2 97%, BMI Body mass index is 30.84 kg/(m^2).  Wt Readings from Last 3 Encounters:  04/18/15 191 lb (86.637 kg)  04/17/15 190 lb (86.183 kg)  04/04/15 186 lb (84.369 kg)     General: Overweight woman, appears comfortable at rest. HEENT: Conjunctiva and lids normal, oropharynx clear. Neck: Supple, no elevated JVP, no thyromegaly. Lungs: Clear to auscultation decreased at the bases, nonlabored breathing at rest. Cardiac: Indistinct PMI, regular rate and rhythm, no S3, no pericardial rub. Abdomen: Soft but protuberant,  nontender, bowel sounds present, no guarding or rebound. Extremities: No pitting edema, distal pulses 2+. Skin: Warm and dry. Musculoskeletal: No kyphosis. Neuropsychiatric: Alert and oriented x3, affect grossly appropriate.   ECG: ECG is not ordered today.   Recent Labwork: 03/01/2015: B Natriuretic Peptide 141.0*; BUN 9; Creatinine 0.64; Hemoglobin 12.6; Platelets 105*; Potassium 4.6; Sodium 137   Assessment and Plan:  1. Ischemic cardiomyopathy with chronic systolic heart failure, LVEF 20-25% range by most recent assessment in [place]. Scar without significant ischemic burden by Myoview study. Plan will be to continue medical therapy, reinforced sodium restriction and compliance. Not adding ACE inhibitor or  ARB due to low blood pressure. She will follow-up for a BMET.  2. Ischemic heart disease, no active angina. We do plan to obtain a lipid panel, she has not had one checked recently.  3. St. Jude ICD in place, noticeably or discharges, followed by Dr. Ladona Ridgel.  4. 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  5. History of stroke with known occlusion of the RICA.   Current medicines were reviewed with the patient today.   Orders Placed This Encounter  Procedures  . Lipid panel    Disposition: FU with me in 1 month.   Signed, [name], MD, [facility] 04/18/2015 12:03 PM    [facility] at [facility] [address] [place], [place], [place] [address] Phone: [phone]; Fax: [phone]

## 2015-04-18 NOTE — Patient Instructions (Signed)
Your physician recommends that you schedule a follow-up appointment in: 1 month. Your physician recommends that you continue on your current medications as directed. Please refer to the Current Medication list given to you today. Your physician recommends that you have a FASTING lipid profile and BMET done in the morning 04/19/15.

## 2015-04-18 NOTE — Telephone Encounter (Signed)
-----   Message from [name], MD sent at 04/18/2015  2:32 PM EDT ----- Regarding: RE: SLEEP STUDY HAS NOT BEEN ORDERED BY DR. [name] Please let her know that we checked on this and what the response was. Yes, I do think that she needs a sleep study - snoring, rule out obstructive sleep apnea.  ----- Message -----    From: Eustace Moore, LPN    Sent: 7/61/9509   2:06 PM      To: [name], MD Subject: SLEEP STUDY HAS NOT BEEN ORDERED BY DR. HAWK#  Per staff at [facility] office, there are no orders or mention of this patient getting a sleep study. Would you like to order it? If so, what is your diagnosis?

## 2015-04-19 NOTE — Telephone Encounter (Signed)
Patient informed that she did need a sleep study but due to her insurance, our office is unable to make that referral. Patient advised that she needed to f/u with her PCP about ordering the sleep study. Also patient advised nurse that she sees Dr. Gerilyn Pilgrim for neuropathy and she would speak with him about this as well. Copy of this note will be forwarded to PCP to handle sleep study referral.

## 2015-04-26 ENCOUNTER — Telehealth: Payer: Self-pay | Admitting: *Deleted

## 2015-04-26 ENCOUNTER — Telehealth: Payer: Self-pay | Admitting: Cardiology

## 2015-04-26 MED ORDER — ATORVASTATIN CALCIUM 10 MG PO TABS: 10.0000 mg | ORAL_TABLET | Freq: Every day | ORAL | Status: DC

## 2015-04-26 MED ORDER — FUROSEMIDE 40 MG PO TABS
40.0000 mg | ORAL_TABLET | Freq: Two times a day (BID) | ORAL | Status: DC
Start: 2015-04-26 — End: 2015-05-11

## 2015-04-26 NOTE — Telephone Encounter (Signed)
-----   Message from [name], MD sent at 04/21/2015  6:40 AM EDT ----- Reviewed. Renal function and potassium normal. Total cholesterol 220 and LDL 132. Statins listed as allergy - can you check an that with her. Might consider Zetia.

## 2015-04-26 NOTE — Telephone Encounter (Signed)
Patient called due to increased weight gain since yesterday. Yesterday weight was 184 lbs and today's weight is 192 lbs. Patient denied increased sodium intake. No c/o chest pain, or dizziness or sob. Patient c/o feeling a tightness in her left lung when she takes a deep breath and coughing a lot. Patient is sleeping okay. Patient denies swelling in legs or feet. Patient is currently taking furosemide 40 mg and spironolactone 12.5 mg daily. Please advise.

## 2015-04-26 NOTE — Telephone Encounter (Signed)
Patient informed and verbalized understanding of plan. 

## 2015-04-26 NOTE — Telephone Encounter (Signed)
Patient walked into clinic wanting to speak with doctor about her gaining 5 pounds since yesterday am She said that she can feel tightness on left side when she takes a deep breath. Stated that she was told she would have this pain with fluid gain.  Also stated that she will need a new RX for fluid pills to be called in if her dose is increased.  Medicaid will not refill current rx

## 2015-04-26 NOTE — Telephone Encounter (Signed)
Patient informed and say's she has tried simvastatin, lovastatin, pravastatin, crestor and zetia and developed leg pain. Patient said she has not been on lipitor before and is willing to try it.

## 2015-04-26 NOTE — Telephone Encounter (Signed)
Change chart to statin intolerance rather than true allergy. We could try Lipitor 10 mg starting every other day.

## 2015-04-26 NOTE — Telephone Encounter (Signed)
Double Lasix to 40 mg twice daily for the next few days until weight comes back down to prior baseline.

## 2015-04-28 ENCOUNTER — Other Ambulatory Visit (HOSPITAL_COMMUNITY): Payer: Self-pay | Admitting: Respiratory Therapy

## 2015-04-28 ENCOUNTER — Other Ambulatory Visit: Payer: Self-pay | Admitting: Neurology

## 2015-04-28 DIAGNOSIS — G4730 Sleep apnea, unspecified: Secondary | ICD-10-CM

## 2015-05-11 ENCOUNTER — Encounter: Payer: Self-pay | Admitting: Cardiology

## 2015-05-11 ENCOUNTER — Ambulatory Visit (INDEPENDENT_AMBULATORY_CARE_PROVIDER_SITE_OTHER): Payer: Medicaid Other | Admitting: *Deleted

## 2015-05-11 ENCOUNTER — Ambulatory Visit (INDEPENDENT_AMBULATORY_CARE_PROVIDER_SITE_OTHER): Payer: Medicaid Other | Admitting: Cardiology

## 2015-05-11 VITALS — BP 118/88 | HR 66 | Ht 66.0 in | Wt 193.0 lb

## 2015-05-11 DIAGNOSIS — I6521 Occlusion and stenosis of right carotid artery: Secondary | ICD-10-CM

## 2015-05-11 DIAGNOSIS — Z7901 Long term (current) use of anticoagulants: Secondary | ICD-10-CM

## 2015-05-11 DIAGNOSIS — R894 Abnormal immunological findings in specimens from other organs, systems and tissues: Secondary | ICD-10-CM

## 2015-05-11 DIAGNOSIS — I2510 Atherosclerotic heart disease of native coronary artery without angina pectoris: Secondary | ICD-10-CM

## 2015-05-11 DIAGNOSIS — I255 Ischemic cardiomyopathy: Secondary | ICD-10-CM | POA: Diagnosis not present

## 2015-05-11 DIAGNOSIS — I679 Cerebrovascular disease, unspecified: Secondary | ICD-10-CM

## 2015-05-11 DIAGNOSIS — Z5181 Encounter for therapeutic drug level monitoring: Secondary | ICD-10-CM

## 2015-05-11 DIAGNOSIS — I5022 Chronic systolic (congestive) heart failure: Secondary | ICD-10-CM

## 2015-05-11 DIAGNOSIS — I739 Peripheral vascular disease, unspecified: Secondary | ICD-10-CM | POA: Diagnosis not present

## 2015-05-11 LAB — POCT INR: INR: 2

## 2015-05-11 MED ORDER — FUROSEMIDE 40 MG PO TABS: 60.0000 mg | ORAL_TABLET | Freq: Every day | ORAL | Status: DC

## 2015-05-11 MED ORDER — SPIRONOLACTONE 25 MG PO TABS: 25.0000 mg | ORAL_TABLET | Freq: Every day | ORAL | Status: DC

## 2015-05-11 NOTE — Progress Notes (Signed)
Cardiology Office Note  Date: 05/11/2015   ID: [name] [name], DOB 02-28-1961, MRN [id number]  PCP: Fredirick Maudlin, MD  Primary Cardiologist: Nona Dell, MD   Chief Complaint  Patient presents with  . Cardiomyopathy  . Coronary Artery Disease    History of Present Illness: [name] [name] is a medically complex 54 y.o. female last seen in April. She presents for a routine visit. Reports NYHA class II to sometimes class III dyspnea on exertion, no regular angina symptoms. We reviewed her home vital signs and weights. Her weight continues to jump up and down, sometimes as much is 8 pounds at a time. A lot of this seems to be related to fluid and sodium intake in discussing her diet today. She has been using extra Lasix as much as every other day. Blood pressure still remains on the low normal range. We have held off adding ARB so far for this reason.  Since last visit we started low-dose Lipitor. She seems to be tolerating it so far. She has a lot of musculoskeletal and neuropathic symptoms as well as PAD which complicates the picture as far as side effects.  Workup within the last few months at [facility] in [place] included a myocardial perfusion imaging which showed no active ischemia with fixed defects in the anterolateral and apical regions, LVEF 16%. Echocardiogram reported LVEF of approximately 20% with inferior, posterior, and apical akinesis, mild LVH with mild chamber dilatation, normal RV contraction, moderate left atrial enlargement, device wire present in the right heart, moderate mitral regurgitation, mild tricuspid regurgitation, RVSP not calculated.  Today we discussed her overall status, my concerns about her symptoms, and the possibility of additional cardiac evaluation through the Heart Failure clinic. We will continue to modify her medications and see if we can get her symptoms better controlled however.   Past Medical History  Diagnosis Date  . History  of pneumonia   . Chronic systolic heart failure   . 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 Coronary atherosclerosis of native coronary artery     Severe two vessel disease   . Cocaine abuse in remission   . Raynaud's phenomenon   . Essential hypertension, benign   . Mixed hyperlipidemia   . Anticardiolipin antibody positive     Chronic Coumadin  . PVD (peripheral vascular disease)   . Ischemic cardiomyopathy     LVEF 30-35%  . Headache     Past Surgical History  Procedure Laterality Date  . Appendectomy    . Laryngeal polyp excision    . L1 corpectomy   12/10  . Cardiac defibrillator placement      St.Jude ICD  . Abdominal aortagram N/A 05/25/2014    Procedure: ABDOMINAL Ronny Flurry;  Surgeon: Iran Ouch, MD;  Location: [facility] CATH LAB;  Service: Cardiovascular;  Laterality: N/A;    Current Outpatient Prescriptions  Medication Sig Dispense Refill  . aspirin EC 81 MG tablet Take 81 mg by mouth daily.    Marland Kitchen atorvastatin (LIPITOR) 10 MG tablet Take 1 tablet (10 mg total) by mouth at bedtime. 90 tablet 3  . B Complex-Folic Acid (B COMPLEX-VITAMIN B12 PO) Take by mouth.    . Calcium Carbonate-Vitamin D (CALCIUM-VITAMIN D3 PO) Take by mouth.    . carvedilol (COREG) 12.5 MG tablet Take 1 tablet (12.5 mg total) by mouth 2 (two) times daily with a meal. 90 tablet 3  . cetirizine (ZYRTEC) 10 MG tablet Take  10 mg by mouth daily.    . chlorpheniramine-HYDROcodone (TUSSIONEX PENNKINETIC ER) 10-8 MG/5ML LQCR Take 5 mLs by mouth every 12 (twelve) hours. 60 mL 0  . citalopram (CELEXA) 20 MG tablet Take 20 mg by mouth daily.     Marland Kitchen docusate sodium (COLACE) 100 MG capsule Take 100 mg by mouth at bedtime.    . famotidine (PEPCID) 20 MG tablet Take 20 mg by mouth 2 (two) times daily.      . fluticasone (FLONASE) 50 MCG/ACT nasal spray 1 spray each nares bid 10 g 1  . furosemide (LASIX) 40 MG tablet Take 1.5 tablets (60 mg total) by mouth daily. 45 tablet 3  . gabapentin  (NEURONTIN) 300 MG capsule Take 300 mg by mouth 2 (two) times daily.    Marland Kitchen HYDROcodone-acetaminophen (NORCO) 10-325 MG per tablet Take 1 tablet by mouth 4 (four) times daily as needed for severe pain. pain    . ibuprofen (ADVIL,MOTRIN) 200 MG tablet Take 400 mg by mouth every 6 (six) hours as needed (PAIN). pain    . isosorbide mononitrate (IMDUR) 30 MG 24 hr tablet TAKE 1 TABLET BY MOUTH DAILY 90 tablet 0  . levofloxacin (LEVAQUIN) 500 MG tablet Take 1 tablet (500 mg total) by mouth daily. 10 tablet 0  . Multiple Vitamins-Minerals (MULTIVITAMINS THER. W/MINERALS) TABS Take 1 tablet by mouth daily.      . nitroGLYCERIN (NITROSTAT) 0.4 MG SL tablet Place 0.4 mg under the tongue every 5 (five) minutes as needed for chest pain.    . potassium chloride SA (K-DUR,KLOR-CON) 20 MEQ tablet Take 20 mEq by mouth daily.    . promethazine (PHENERGAN) 25 MG tablet Take 25 mg by mouth every 6 (six) hours as needed for nausea.    Marland Kitchen spironolactone (ALDACTONE) 25 MG tablet Take 1 tablet (25 mg total) by mouth daily. 90 tablet 3  . warfarin (COUMADIN) 10 MG tablet Take 5-10 mg by mouth daily. Take 1 tablet on Mon, Wed, Fri, and Sat.  Take 1/2 tablet on Sun, Tues, Thurs.     No current facility-administered medications for this visit.    Allergies:  Penicillins; Metoclopramide hcl; Metoprolol; and Statins   Social History: The patient  reports that she quit smoking about 19 months ago. Her smoking use included E-cigarettes. She started smoking about 37 years ago. She has never used smokeless tobacco. She reports that she does not drink alcohol or use illicit drugs.   ROS:  Please see the history of present illness. Otherwise, complete review of systems is positive for lower back tenderness and discomfort, recently left leg pain. No palpitations or syncope, no device discharges..  All other systems are reviewed and negative.   Physical Exam: VS:  BP 118/88 mmHg  Pulse 66  Ht 5\' 6"  (1.676 m)  Wt 193 lb (87.544  kg)  BMI 31.17 kg/m2  SpO2 99%, BMI Body mass index is 31.17 kg/(m^2).  Wt Readings from Last 3 Encounters:  05/11/15 193 lb (87.544 kg)  04/18/15 191 lb (86.637 kg)  04/17/15 190 lb (86.183 kg)     General: Overweight woman, appears comfortable at rest. HEENT: Conjunctiva and lids normal, oropharynx clear. Neck: Supple, no elevated JVP, no thyromegaly. Lungs: Clear to auscultation decreased at the bases, nonlabored breathing at rest. Cardiac: Indistinct PMI, regular rate and rhythm, no S3, no pericardial rub. Abdomen: Soft but protuberant, nontender, bowel sounds present, no guarding or rebound. Extremities: No pitting edema, distal pulses 2+. Skin: Warm and dry. Musculoskeletal: No  kyphosis. Neuropsychiatric: Alert and oriented x3, affect grossly appropriate.   ECG: ECG is not ordered today.   Recent Labwork: 03/01/2015: B Natriuretic Peptide 141.0*; BUN 9; Creatinine 0.64; Hemoglobin 12.6; Platelets 105*; Potassium 4.6; Sodium 137  04/19/2015: BUN 12, creatinine 0.6, triglycerides 172, cholesterol 220, HDL 54, LDL 132, potassium 4.2   Assessment and Plan:  1. Chronic systolic heart failure with ischemic cardiomyopathy, LVEF approximately 20% by most recent testing. Plan to have her increase Lasix to 60 mg daily (can take additional doses for weight gain as already instructed), increase aldactone to 25 mg daily, continue Coreg. I would like to add ARB at very low-dose assuming she tolerates the diuretic adjustments and renal function remains stable. We will see her back with BMET.  2. Ischemic cardiomyopathy with negative ischemic myocardial perfusion study done recently in [place]. She has a St. Jude ICD in place without recent shocks.  3. History of anti-Cardiolite and antibody syndrome, on chronic Coumadin.  4. Previous history of stroke with known total occlusion of the RICA, less than 50% LICA stenosis by carotid Dopplers in October 2015.  5. Peripheral arterial disease,  we will pursue follow-up work lower extremity arterial studies in the next few months.  Current medicines were reviewed with the patient today.   Orders Placed This Encounter  Procedures  . Basic metabolic panel    Disposition: FU with me in 1 month.   Signed, [name], MD, [facility] 05/11/2015 12:04 PM    [facility] at [facility] [address] [place], [place], [place] [address] Phone: [phone]; Fax: [phone]

## 2015-05-11 NOTE — Patient Instructions (Signed)
Your physician has recommended you make the following change in your medication:  Change furosemide to 60 mg daily. Please take 1&1/2 of your 40 mg tablets to equal this dose. Increase your spironolactone to 25 mg daily. Please take the whole pill from now on. Continue all other medications the same. Your physician recommends that you return for lab work in 1 month just before your next visit to check your BMET. Your physician recommends that you schedule a follow-up appointment in: 1 month.

## 2015-06-06 ENCOUNTER — Ambulatory Visit (INDEPENDENT_AMBULATORY_CARE_PROVIDER_SITE_OTHER): Payer: Medicaid Other | Admitting: *Deleted

## 2015-06-06 ENCOUNTER — Telehealth: Payer: Self-pay | Admitting: Cardiology

## 2015-06-06 DIAGNOSIS — Z5181 Encounter for therapeutic drug level monitoring: Secondary | ICD-10-CM

## 2015-06-06 LAB — POCT INR: INR: 1.8

## 2015-06-06 NOTE — Telephone Encounter (Signed)
furosemide (LASIX) 40 MG tablet SENT TO [facility]

## 2015-06-06 NOTE — Telephone Encounter (Signed)
Pt requesting refill of lasix to state that she may take extra as needed for weight gain (already taking 60 mg daily) per LOV MD notes. Pt says she is coming in 6/13 for appt and is having additional swelling problems that pt will discuss with Dr. Diona Browner at appointment and that she would wait until then incase of med changes. Pt has enough until the end of the month.

## 2015-06-08 ENCOUNTER — Encounter: Payer: Self-pay | Admitting: *Deleted

## 2015-06-12 ENCOUNTER — Encounter: Payer: Self-pay | Admitting: Cardiology

## 2015-06-12 ENCOUNTER — Ambulatory Visit (INDEPENDENT_AMBULATORY_CARE_PROVIDER_SITE_OTHER): Payer: Medicaid Other | Admitting: Cardiology

## 2015-06-12 VITALS — BP 110/64 | HR 66 | Ht 66.0 in | Wt 203.0 lb

## 2015-06-12 DIAGNOSIS — I255 Ischemic cardiomyopathy: Secondary | ICD-10-CM | POA: Diagnosis not present

## 2015-06-12 DIAGNOSIS — I5022 Chronic systolic (congestive) heart failure: Secondary | ICD-10-CM

## 2015-06-12 DIAGNOSIS — I2510 Atherosclerotic heart disease of native coronary artery without angina pectoris: Secondary | ICD-10-CM

## 2015-06-12 DIAGNOSIS — D6859 Other primary thrombophilia: Secondary | ICD-10-CM | POA: Diagnosis not present

## 2015-06-12 NOTE — Progress Notes (Signed)
Cardiology Office Note  Date: 06/12/2015   ID: [name] [name], DOB 01-May-1961, MRN [id number]  PCP: Fredirick Maudlin, MD  Primary Cardiologist: Nona Dell, MD   Chief Complaint  Patient presents with  . Cardiomyopathy  . Coronary Artery Disease    History of Present Illness: [name] [name] is a medically complex 54 y.o. female last seen in May. At the last visit we increased her Lasix dose as well as Aldactone, continued on Coreg. She comes in today for a routine visit, did not follow-up with her lab work as requested. Her weight is up, she attributes a lot of this to overeating. She denies any leg edema. On the other hand, she states she has been taking up to 100 mg of Lasix sometimes every other day because her hands seem to be swollen.  Workup within the last few months at [facility] in [place] included a myocardial perfusion imaging which showed no active ischemia with fixed defects in the anterolateral and apical regions, LVEF 16%. Echocardiogram reported LVEF of approximately 20% with inferior, posterior, and apical akinesis, mild LVH with mild chamber dilatation, normal RV contraction, moderate left atrial enlargement, device wire present in the right heart, moderate mitral regurgitation, mild tricuspid regurgitation, RVSP not calculated.  Today I talked with her again about trying to further optimize her medical regimen. I would like to add a low-dose ARB next, but need to see her lab work first. I also talked about eventual referral to the Advanced Heart Failure clinic for further hemodynamic testing. He denies any chest pain, reports NYHA class II-III dyspnea.  She brought in information from the [facility] requesting clarification of her cardiac status. She states that Dr. [name] completed the general form, we will complete the cardiovascular section when it is available. She denies any syncope, has had no device shocks with her ICD.  Past Medical History  Diagnosis Date   . History of pneumonia   . Chronic systolic heart failure   . 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 Coronary atherosclerosis of native coronary artery     Severe two vessel disease   . Cocaine abuse in remission   . Raynaud's phenomenon   . Essential hypertension, benign   . Mixed hyperlipidemia   . Anticardiolipin antibody positive     Chronic Coumadin  . PVD (peripheral vascular disease)   . Ischemic cardiomyopathy     LVEF 30-35%  . Headache     Past Surgical History  Procedure Laterality Date  . Appendectomy    . Laryngeal polyp excision    . L1 corpectomy   12/10  . Cardiac defibrillator placement      St.Jude ICD  . Abdominal aortagram N/A 05/25/2014    Procedure: ABDOMINAL Ronny Flurry;  Surgeon: Iran Ouch, MD;  Location: [facility] CATH LAB;  Service: Cardiovascular;  Laterality: N/A;    Current Outpatient Prescriptions  Medication Sig Dispense Refill  . aspirin EC 81 MG tablet Take 81 mg by mouth daily.    Marland Kitchen atorvastatin (LIPITOR) 10 MG tablet Take 1 tablet (10 mg total) by mouth at bedtime. 90 tablet 3  . B Complex-Folic Acid (B COMPLEX-VITAMIN B12 PO) Take by mouth.    . Calcium Carbonate-Vitamin D (CALCIUM-VITAMIN D3 PO) Take by mouth.    . carvedilol (COREG) 12.5 MG tablet Take 1 tablet (12.5 mg total) by mouth 2 (two) times daily with a meal. 90 tablet 3  . cetirizine (ZYRTEC) 10  MG tablet Take 10 mg by mouth daily.    . chlorpheniramine-HYDROcodone (TUSSIONEX PENNKINETIC ER) 10-8 MG/5ML LQCR Take 5 mLs by mouth every 12 (twelve) hours. 60 mL 0  . citalopram (CELEXA) 20 MG tablet Take 20 mg by mouth daily.     . Coenzyme Q10 (CO Q 10 PO) Take by mouth 2 (two) times daily.    Marland Kitchen docusate sodium (COLACE) 100 MG capsule Take 100 mg by mouth at bedtime.    . famotidine (PEPCID) 20 MG tablet Take 20 mg by mouth 2 (two) times daily.      . fluticasone (FLONASE) 50 MCG/ACT nasal spray 1 spray each nares bid 10 g 1  . furosemide (LASIX) 40 MG tablet  Take 1.5 tablets (60 mg total) by mouth daily. 45 tablet 3  . gabapentin (NEURONTIN) 300 MG capsule Take 300 mg by mouth 2 (two) times daily.    Marland Kitchen HYDROcodone-acetaminophen (NORCO) 10-325 MG per tablet Take 1 tablet by mouth 4 (four) times daily as needed for severe pain. pain    . ibuprofen (ADVIL,MOTRIN) 200 MG tablet Take 400 mg by mouth every 6 (six) hours as needed (PAIN). pain    . isosorbide mononitrate (IMDUR) 30 MG 24 hr tablet TAKE 1 TABLET BY MOUTH DAILY 90 tablet 0  . levofloxacin (LEVAQUIN) 500 MG tablet Take 1 tablet (500 mg total) by mouth daily. 10 tablet 0  . Multiple Vitamins-Minerals (MULTIVITAMINS THER. W/MINERALS) TABS Take 1 tablet by mouth daily.      . nitroGLYCERIN (NITROSTAT) 0.4 MG SL tablet Place 0.4 mg under the tongue every 5 (five) minutes as needed for chest pain.    . potassium chloride SA (K-DUR,KLOR-CON) 20 MEQ tablet Take 20 mEq by mouth daily.    . promethazine (PHENERGAN) 25 MG tablet Take 25 mg by mouth every 6 (six) hours as needed for nausea.    Marland Kitchen spironolactone (ALDACTONE) 25 MG tablet Take 1 tablet (25 mg total) by mouth daily. 90 tablet 3  . warfarin (COUMADIN) 10 MG tablet Take 5-10 mg by mouth daily. Take 1 tablet on Mon, Wed, Fri, and Sat.  Take 1/2 tablet on Sun, Tues, Thurs.     No current facility-administered medications for this visit.    Allergies:  Penicillins; Metoclopramide hcl; Metoprolol; and Statins   Social History: The patient  reports that she quit smoking about 20 months ago. Her smoking use included E-cigarettes. She started smoking about 37 years ago. She has never used smokeless tobacco. She reports that she does not drink alcohol or use illicit drugs.   ROS:  Please see the history of present illness. Otherwise, complete review of systems is positive for her mid costal discomfort, chronic leg pain, intermittent "ice pick" headaches.  All other systems are reviewed and negative.   Physical Exam: VS:  BP 110/64 mmHg  Pulse 66   Ht  (1.676 m)  Wt 203 lb (92.08 kg)  BMI 32.78 kg/m2  SpO2 98%, BMI Body mass index is 32.78 kg/(m^2).  Wt Readings from Last 3 Encounters:  06/12/15 203 lb (92.08 kg)  05/11/15 193 lb (87.544 kg)  04/18/15 191 lb (86.637 kg)     General: Overweight woman, appears comfortable at rest. HEENT: Conjunctiva and lids normal, oropharynx clear. Neck: Supple, elevated JVP, no thyromegaly. Lungs: Clear to auscultation decreased at the bases, nonlabored breathing at rest. Cardiac: Indistinct PMI, regular rate and rhythm, no S3, no pericardial rub. Abdomen: Soft but protuberant, nontender, bowel sounds present, no guarding or  rebound. Extremities: Trace edema, distal pulses 2+. Skin: Warm and dry. Musculoskeletal: No kyphosis. Neuropsychiatric: Alert and oriented x3, affect grossly appropriate.   ECG: ECG is not ordered today.   Recent Labwork: 03/01/2015: B Natriuretic Peptide 141.0*; BUN 9; Creatinine, Ser 0.64; Hemoglobin 12.6; Platelets 105*; Potassium 4.6; Sodium 137    Assessment and Plan:  1. Chronic systolic heart failure with ischemic cardiomyopathy and LVEF approximately 20%. Weight is up as noted above. Diuretics were advanced at the last visit. Patient states that she will follow-up for BMET. I would like to see lab results before we further modifying her medications. Ideally, would like to start low-dose ARB, may need further titration of Lasix as well. Eventually, referral to the Advanced Heart Failure clinic is planned.  2. Ischemic heart disease, scar noted in the anterolateral and apical zones by myocardial perfusion study done earlier this year. She denies active angina symptoms.  3. St. Jude ICD in place, patient denies syncope or device shocks.  4. Low-normal blood pressure. This has limited titration of medical therapy, however we will try and go with a low dose ARB next.  5. Anticardiolipin antibody positive, on chronic Coumadin. Also prior history of stroke. I  am continuing her on aspirin as well  Current medicines were reviewed with the patient today.   Disposition: FU with me in 1 month.   Signed, [name], MD, [facility] 06/12/2015 11:17 AM    [facility] at [facility] [address] [place], [place], [place] [address] Phone: [phone]; Fax: [phone]

## 2015-06-12 NOTE — Patient Instructions (Signed)
Your physician recommends that you continue on your current medications as directed. Please refer to the Current Medication list given to you today. Please have your requested lab work done tomorrow to check your BMET. Your physician recommends that you schedule a follow-up appointment in: 1 month.

## 2015-06-15 ENCOUNTER — Telehealth: Payer: Self-pay | Admitting: *Deleted

## 2015-06-15 NOTE — Telephone Encounter (Signed)
-----   Message from [name], MD sent at 06/14/2015  4:46 PM EDT ----- Results noted. Normal creatinine, potassium high normal at 5.0. Would recommend cutting KCL back to 10 mEq daily. Let's try and start Cozaar 25 mg, one half tablet daily. Keep an eye on blood pressure. She will need to have a follow-up BMET in 2 weeks of this change.

## 2015-06-16 NOTE — Telephone Encounter (Signed)
Per Judeth Cornfield, taken care of.  Pharmacy has refill.

## 2015-06-16 NOTE — Telephone Encounter (Signed)
furosemide (LASIX) 40 MG tablet needs a new RX due to having to take more than 1.5 a day

## 2015-06-19 ENCOUNTER — Other Ambulatory Visit: Payer: Self-pay | Admitting: Cardiology

## 2015-06-20 ENCOUNTER — Telehealth: Payer: Self-pay | Admitting: *Deleted

## 2015-06-20 DIAGNOSIS — I10 Essential (primary) hypertension: Secondary | ICD-10-CM

## 2015-06-20 MED ORDER — POTASSIUM CHLORIDE ER 10 MEQ PO TBCR: 10.0000 meq | EXTENDED_RELEASE_TABLET | Freq: Every day | ORAL | Status: DC

## 2015-06-20 MED ORDER — LOSARTAN POTASSIUM 25 MG PO TABS: 12.5000 mg | ORAL_TABLET | Freq: Every day | ORAL | Status: DC

## 2015-06-20 NOTE — Telephone Encounter (Signed)
-----   Message from Samuel G McDowell, MD sent at 06/14/2015  4:46 PM EDT ----- Results noted. Normal creatinine, potassium high normal at 5.0. Would recommend cutting KCL back to 10 mEq daily. Let's try and start Cozaar 25 mg, one half tablet daily. Keep an eye on blood pressure. She will need to have a follow-up BMET in 2 weeks of this change. 

## 2015-06-20 NOTE — Telephone Encounter (Signed)
Patient informed and verbalized understanding of plan. Lab order faxed to [facility] lab along with Lipid order.

## 2015-06-29 ENCOUNTER — Telehealth: Payer: Self-pay | Admitting: Cardiology

## 2015-06-29 ENCOUNTER — Ambulatory Visit (INDEPENDENT_AMBULATORY_CARE_PROVIDER_SITE_OTHER): Payer: Medicaid Other | Admitting: *Deleted

## 2015-06-29 DIAGNOSIS — Z5181 Encounter for therapeutic drug level monitoring: Secondary | ICD-10-CM

## 2015-06-29 LAB — POCT INR: INR: 2.3

## 2015-06-29 MED ORDER — FUROSEMIDE 40 MG PO TABS
60.0000 mg | ORAL_TABLET | Freq: Every day | ORAL | Status: DC
Start: 2015-06-29 — End: 2015-07-26

## 2015-06-29 NOTE — Telephone Encounter (Signed)
Mrs. Bode came by the office in regards to her fluid pill. She states that her Medicaid will not pay but for 30 pills per month. Sometimes she has to double up and then she is running out of pills.

## 2015-06-29 NOTE — Telephone Encounter (Signed)
Left message on vm of patient informing her that a new prescription was sent to [facility] so she should be able to get a new prescription if its due.

## 2015-07-02 ENCOUNTER — Ambulatory Visit: Payer: Medicaid Other | Attending: Neurology | Admitting: Sleep Medicine

## 2015-07-02 DIAGNOSIS — G4730 Sleep apnea, unspecified: Secondary | ICD-10-CM | POA: Diagnosis present

## 2015-07-05 ENCOUNTER — Other Ambulatory Visit (HOSPITAL_COMMUNITY): Payer: Self-pay | Admitting: Respiratory Therapy

## 2015-07-05 DIAGNOSIS — G4730 Sleep apnea, unspecified: Secondary | ICD-10-CM

## 2015-07-06 ENCOUNTER — Encounter: Payer: Self-pay | Admitting: *Deleted

## 2015-07-12 ENCOUNTER — Encounter: Payer: Self-pay | Admitting: Cardiology

## 2015-07-12 ENCOUNTER — Ambulatory Visit (INDEPENDENT_AMBULATORY_CARE_PROVIDER_SITE_OTHER): Payer: Medicaid Other | Admitting: Cardiology

## 2015-07-12 VITALS — BP 94/60 | HR 70 | Ht 66.0 in | Wt 206.1 lb

## 2015-07-12 DIAGNOSIS — I10 Essential (primary) hypertension: Secondary | ICD-10-CM

## 2015-07-12 DIAGNOSIS — I255 Ischemic cardiomyopathy: Secondary | ICD-10-CM | POA: Diagnosis not present

## 2015-07-12 DIAGNOSIS — I5022 Chronic systolic (congestive) heart failure: Secondary | ICD-10-CM | POA: Diagnosis not present

## 2015-07-12 MED ORDER — NITROGLYCERIN 0.4 MG SL SUBL: 0.4000 mg | SUBLINGUAL_TABLET | SUBLINGUAL | Status: DC | PRN

## 2015-07-12 NOTE — Progress Notes (Signed)
Cardiology Office Note  Date: 07/12/2015   ID: [name] [name], DOB [birth year], MRN [id number]  PCP: Fredirick Maudlin, MD  Primary Cardiologist: Nona Dell, MD   Chief Complaint  Patient presents with  . Chronic systolic heart failure  . Coronary Artery Disease    History of Present Illness: [name] [name] is a medically complex 54 y.o. female last seen in June. She presents for a routine follow-up visit. Her weight has gone up, but this really looks to be more related to caloric intake rather than fluid overload. She reports that her breathing has improved, NYHA class II, no orthopnea or PND, no leg edema. We discussed her diet, talked about carbohydrate restriction. 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  Follow-up lab work done recently is outlined below. We added low-dose Cozaar since our last visit. She is also taking Lipitor daily, has tolerated this.  Workup back in April at [facility] in [place] included a myocardial perfusion imaging which showed no active ischemia with fixed defects in the anterolateral and apical regions, LVEF 16%. Echocardiogram reported LVEF of approximately 20% with inferior, posterior, and apical akinesis, mild LVH with mild chamber dilatation, normal RV contraction, moderate left atrial enlargement, device wire present in the right heart, moderate mitral regurgitation, mild tricuspid regurgitation, RVSP not calculated.  Past Medical History  Diagnosis Date  . History of pneumonia   . Chronic systolic heart failure   . 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 Coronary atherosclerosis of native coronary artery     Severe two vessel disease   . Cocaine abuse in remission   . Raynaud's phenomenon   . Essential hypertension, benign   . Mixed hyperlipidemia   . Anticardiolipin antibody positive     Chronic Coumadin  . PVD (peripheral vascular disease)   . Ischemic  cardiomyopathy     LVEF 30-35%  . Headache     Current Outpatient Prescriptions  Medication Sig Dispense Refill  . aspirin EC 81 MG tablet Take 81 mg by mouth daily.    Marland Kitchen atorvastatin (LIPITOR) 10 MG tablet Take 10 mg by mouth daily.    . B Complex-Folic Acid (B COMPLEX-VITAMIN B12 PO) Take by mouth.    . Calcium Carbonate-Vitamin D (CALCIUM-VITAMIN D3 PO) Take by mouth.    . carvedilol (COREG) 12.5 MG tablet Take 1 tablet (12.5 mg total) by mouth 2 (two) times daily with a meal. 90 tablet 3  . cetirizine (ZYRTEC) 10 MG tablet Take 10 mg by mouth daily.    . chlorpheniramine-HYDROcodone (TUSSIONEX PENNKINETIC ER) 10-8 MG/5ML LQCR Take 5 mLs by mouth every 12 (twelve) hours. 60 mL 0  . citalopram (CELEXA) 20 MG tablet Take 20 mg by mouth daily.     . Coenzyme Q10 (CO Q 10 PO) Take by mouth 2 (two) times daily.    Marland Kitchen docusate sodium (COLACE) 100 MG capsule Take 100 mg by mouth at bedtime.    . fluticasone (FLONASE) 50 MCG/ACT nasal spray 1 spray each nares bid 10 g 1  . furosemide (LASIX) 40 MG tablet Take 1.5 tablets (60 mg total) by mouth daily. May take additional dose if needed for weight gain 75 tablet 3  . gabapentin (NEURONTIN) 300 MG capsule Take 300 mg by mouth 2 (two) times daily.    Marland Kitchen HYDROcodone-acetaminophen (NORCO) 10-325 MG per tablet Take 1 tablet by mouth 4 (four)  times daily as needed for severe pain. pain    . ibuprofen (ADVIL,MOTRIN) 200 MG tablet Take 400 mg by mouth every 6 (six) hours as needed (PAIN). pain    . isosorbide mononitrate (IMDUR) 30 MG 24 hr tablet TAKE 1 TABLET BY MOUTH DAILY 90 tablet 0  . losartan (COZAAR) 25 MG tablet Take 0.5 tablets (12.5 mg total) by mouth daily. 45 tablet 3  . MISC NATURAL PRODUCTS PO Take 1 tablet by mouth 2 (two) times daily. [employer] [facility] OTC from [facility]    . Multiple Vitamins-Minerals (MULTIVITAMINS THER. W/MINERALS) TABS Take 1 tablet by mouth daily.      . nitroGLYCERIN (NITROSTAT) 0.4 MG SL tablet Place 1 tablet (0.4  mg total) under the tongue every 5 (five) minutes x 3 doses as needed for chest pain. 25 tablet 3  . potassium chloride (K-DUR) 10 MEQ tablet Take 1 tablet (10 mEq total) by mouth daily. 90 tablet 3  . promethazine (PHENERGAN) 25 MG tablet Take 25 mg by mouth every 6 (six) hours as needed for nausea.    . ranitidine (ZANTAC) 150 MG tablet Take 150 mg by mouth 2 (two) times daily.    Marland Kitchen spironolactone (ALDACTONE) 25 MG tablet Take 1 tablet (25 mg total) by mouth daily. 90 tablet 3  . warfarin (COUMADIN) 10 MG tablet Take 5-10 mg by mouth daily. Take 1 tablet on Mon, Wed, Fri, and Sat.  Take 1/2 tablet on Sun, Tues, Thurs.     No current facility-administered medications for this visit.    Allergies:  Penicillins; Metoclopramide hcl; Metoprolol; and Statins   Social History: The patient  reports that she quit smoking about 21 months ago. Her smoking use included E-cigarettes. She started smoking about 37 years ago. She has never used smokeless tobacco. She reports that she does not drink alcohol or use illicit drugs.   ROS:  Please see the history of present illness. Otherwise, complete review of systems is positive for chronic back pain and leg pain.  All other systems are reviewed and negative.   Physical Exam: VS:  BP 94/60 mmHg  Pulse 70  Ht 5\' 6"  (1.676 m)  Wt 206 lb 1.9 oz (93.495 kg)  BMI 33.28 kg/m2  SpO2 96%, BMI Body mass index is 33.28 kg/(m^2).  Wt Readings from Last 3 Encounters:  07/12/15 206 lb 1.9 oz (93.495 kg)  07/02/15 203 lb (92.08 kg)  06/12/15 203 lb (92.08 kg)     General: Overweight woman, appears comfortable at rest. HEENT: Conjunctiva and lids normal, oropharynx clear. Neck: Supple, no elevated JVP, no thyromegaly. Lungs: Clear to auscultation decreased at the bases, nonlabored breathing at rest. Cardiac: Indistinct PMI, regular rate and rhythm, no S3, no pericardial rub. Abdomen: Soft but protuberant, nontender, bowel sounds present, no guarding or  rebound. Extremities: Trace edema, distal pulses 2+. Skin: Warm and dry. Musculoskeletal: No kyphosis. Neuropsychiatric: Alert and oriented x3, affect grossly appropriate.  ECG: ECG is not ordered today.  Recent Labwork: 03/01/2015: B Natriuretic Peptide 141.0*; BUN 9; Creatinine, Ser 0.64; Hemoglobin 12.6; Platelets 105*; Potassium 4.6; Sodium 137  06/13/2015: BUN 16, creatinine 0.6, potassium 5.0  Assessment and Plan:  1. Ischemic cardiomyopathy with chronic systolic heart failure. Continue current regimen, blood pressure precludes further adjustments at this time. We will follow-up on recent lab work including BMET. No angina symptoms, scar noted in the anterolateral and apical zones by myocardial perfusion study done in April.  2. Obesity. We discussed diet and weight  loss measures.  3. St. Jude ICD in place, no device shocks.  4. Anticardiolipin antibody positive, on chronic Coumadin, also prior history of stroke. She continues on aspirin as well.  Current medicines were reviewed with the patient today.   Orders Placed This Encounter  Procedures  . Basic metabolic panel    Disposition: FU with me in 6 weeks.   Signed, [name], MD, [facility] 07/12/2015 12:16 PM    [facility] at [facility] [address] [place], [place], [place] [address] Phone: [phone]; Fax: [phone]

## 2015-07-12 NOTE — Patient Instructions (Addendum)
Your physician recommends that you continue on your current medications as directed. Please refer to the Current Medication list given to you today. Your physician recommends that you return for lab work in 6 weeks just before your next visit to check your BMET. Your physician recommends that you schedule a follow-up appointment in: 6 weeks.

## 2015-07-14 ENCOUNTER — Telehealth: Payer: Self-pay | Admitting: *Deleted

## 2015-07-14 NOTE — Telephone Encounter (Signed)
-----   Message from [name], MD sent at 07/12/2015  1:32 PM EDT ----- Reviewed. Renal function and potassium remain normal. Cholesterol numbers look much better, LDL down to 71, HDL 59, and total cholesterol 142.

## 2015-07-14 NOTE — Telephone Encounter (Signed)
Patient informed. 

## 2015-07-17 ENCOUNTER — Other Ambulatory Visit: Payer: Self-pay | Admitting: Cardiology

## 2015-07-17 ENCOUNTER — Ambulatory Visit (INDEPENDENT_AMBULATORY_CARE_PROVIDER_SITE_OTHER): Payer: Medicaid Other | Admitting: *Deleted

## 2015-07-17 DIAGNOSIS — I255 Ischemic cardiomyopathy: Secondary | ICD-10-CM | POA: Diagnosis not present

## 2015-07-17 NOTE — Progress Notes (Signed)
Remote ICD transmission.   

## 2015-07-18 LAB — CUP PACEART REMOTE DEVICE CHECK
Brady Statistic RA Percent Paced: 0 %
Brady Statistic RV Percent Paced: 0 %
Date Time Interrogation Session: 20160719133735
HighPow Impedance: 47 Ohm
Lead Channel Impedance Value: 495 Ohm
Lead Channel Impedance Value: 511 Ohm
Lead Channel Sensing Intrinsic Amplitude: 2.5 mV
Lead Channel Sensing Intrinsic Amplitude: 20.6 mV
Lead Channel Setting Pacing Amplitude: 2 V
Lead Channel Setting Pacing Amplitude: 2.4 V
Lead Channel Setting Pacing Pulse Width: 0.5 ms
Pulse Gen Serial Number: 504498
Zone Setting Detection Interval: 300 ms
Zone Setting Detection Interval: 353 ms

## 2015-07-19 NOTE — Sleep Study (Signed)
[facility] Syniyah Bourne A. Gerilyn Pilgrim, MD     [email or web address]         Patient Name: [name] [name], [name] [name] Date: 07/02/2015 Gender: Female D.O.B: 24-Aug-1961 Age (years): 35 Referring Provider: Beryle Beams MD, ABSM Height (inches): 66 Interpreting Physician: Beryle Beams MD, ABSM Weight (lbs): 203 RPSGT: Alfonso Ellis BMI: 33 MRN: [id number] Neck Size: 16.50   CLINICAL INFORMATION  Sleep Study Type: NPSG Indication for sleep study: N/A Epworth Sleepiness Score: 6  SLEEP STUDY TECHNIQUE  As per the AASM Manual for the Scoring of Sleep and Associated Events v2.3 (April 2016) with a hypopnea requiring 4% desaturations. The channels recorded and monitored were frontal, central and occipital EEG, electrooculogram (EOG), submentalis EMG (chin), nasal and oral airflow, thoracic and abdominal wall motion, anterior tibialis EMG, snore microphone, electrocardiogram, and pulse oximetry. MEDICATIONS EditMoveRemove this section  Patient's medications include: KLONOPIN, METHOCARBAMOL. Medications self-administered by patient during sleep study : Sleep medicine administered - Unspecified at 09:23:53 PM  SLEEP ARCHITECTURE  The study was initiated at 10:18:21 PM and ended at 4:51:25 AM. Sleep onset time was 39.2 minutes and the sleep efficiency was 74.6%. The total sleep time was 293.3 minutes. Stage REM latency was 267.5 minutes. The patient spent 2.56% of the night in stage N1 sleep, 78.35% in stage N2 sleep, 18.24% in stage N3 and 0.85% in REM. Alpha intrusion was absent. Supine sleep was 0.00%.  RESPIRATORY PARAMETERS  The overall apnea/hypopnea index (AHI) was 2.3 per hour. There were 2 total apneas, including 1 obstructive, 1 central and 0 mixed apneas. There were 9 hypopneas and 0 RERAs. The AHI during Stage REM sleep was 0.0 per hour. AHI while supine was N/A per hour. The mean oxygen saturation was 93.58%.  Moderate snoring was noted during this study.  CARDIAC DATA  EditMoveRemove this section The 2 lead EKG demonstrated sinus rhythm. The mean heart rate was N/A beats per minute. Other EKG findings include: PVCs.  LEG MOVEMENT DATA  The total PLMS were 22 with a resulting PLMS index of 4.50. Associated arousal with leg movement index was 0.0 .  IMPRESSIONS  No significant obstructive sleep apnea occurred during this study (AHI = 2.3/h). No significant central sleep apnea occurred during this study (CAI = 0.2/h). The patient snored with Moderate snoring volume. EKG findings include PVCs. Clinically significant periodic limb movements did not occur during sleep. No significant associated arousals.

## 2015-07-20 ENCOUNTER — Other Ambulatory Visit: Payer: Self-pay | Admitting: Cardiology

## 2015-07-24 ENCOUNTER — Telehealth: Payer: Self-pay | Admitting: Cardiology

## 2015-07-24 NOTE — Telephone Encounter (Signed)
Patient notified.  She would prefer to go ahead and schedule OV for [place] this week.  Appointment scheduled for Wednesday, 07/26/15 at 1:00 with [name], PA.

## 2015-07-24 NOTE — Telephone Encounter (Signed)
Mrs. Paschall walked into the office stating I have gained 10 lbs over the weekend. I am having shortness of breath with swelling to my feet. Requesting to be seen by a doctor.

## 2015-07-24 NOTE — Telephone Encounter (Signed)
If problems persist, can see NP or PA in [place] this week.

## 2015-07-24 NOTE — Telephone Encounter (Signed)
Spoke with patient about symptoms below.  Stated that she lost 5lbs of fluid over night.  Stated that she kept her feet up and took extra fluid pill at lunch and dinner.  Stated that she did not want to go to the ED.  States that she does have OV with Dr. Gerilyn Pilgrim in the morning & will have him look at her feet.  She also does think that the heat has had a lot to do with this as well.  No c/o dizziness or chest pain.  Did note that she felt like she couldn't get good deep breath in over weekend, but that feels better today.  Informed patient that message will be sent to provider for further advice.

## 2015-07-26 ENCOUNTER — Encounter: Payer: Self-pay | Admitting: Physician Assistant

## 2015-07-26 ENCOUNTER — Ambulatory Visit (INDEPENDENT_AMBULATORY_CARE_PROVIDER_SITE_OTHER): Payer: Medicaid Other | Admitting: Physician Assistant

## 2015-07-26 VITALS — BP 122/78 | HR 66 | Ht 66.0 in | Wt 204.0 lb

## 2015-07-26 DIAGNOSIS — M7989 Other specified soft tissue disorders: Secondary | ICD-10-CM

## 2015-07-26 DIAGNOSIS — I5042 Chronic combined systolic (congestive) and diastolic (congestive) heart failure: Secondary | ICD-10-CM | POA: Insufficient documentation

## 2015-07-26 DIAGNOSIS — I5023 Acute on chronic systolic (congestive) heart failure: Secondary | ICD-10-CM

## 2015-07-26 DIAGNOSIS — I255 Ischemic cardiomyopathy: Secondary | ICD-10-CM

## 2015-07-26 MED ORDER — POTASSIUM CHLORIDE CRYS ER 20 MEQ PO TBCR: 20.0000 meq | EXTENDED_RELEASE_TABLET | Freq: Every day | ORAL | Status: DC

## 2015-07-26 MED ORDER — FUROSEMIDE 80 MG PO TABS: 80.0000 mg | ORAL_TABLET | Freq: Every day | ORAL | Status: DC

## 2015-07-26 NOTE — Patient Instructions (Signed)
Your physician recommends that you schedule a follow-up appointment in: with Dr.McDowell as planned    INCREASE Lasix to 80 mg daily   INCREASE Potassium to 20 meq daily      Thank you for choosing [facility] !

## 2015-07-26 NOTE — Progress Notes (Signed)
Cardiology Office Note   Date:  07/26/2015   ID:  DUSTY WAGONER, DOB 1961-07-05, MRN [id number]  PCP:  Fredirick Maudlin, MD  Cardiologist:  Dr. Diona Browner  Chief Complaint: Edema    History of Present Illness: [name] [name] is a 54 y.o. female who presents for increased weight gain and edema. She has a history of an ischemic cardiomyopathy and recent workup at [facility] in [place] in April 2016 showed a mild cardial perfusion image with no active ischemia with fixed defects in the anterolateral and apical regions EF 16%. Echo EF 20% with inferior, posterior, and apical akinesis, mild LVH with mild chamber dilatation and normal RV contraction, moderate LAE enlargement, device were present in the right heart, moderate MR, mild TR, RVSP not calculated. 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  She saw Dr. Diona Browner 07/12/15 and had some weight gain that was felt related to increase caloric intake rather than fluid overload. No changes in her medicine were made.  Patient comes in today stating she gained 11 pounds overnight on the weekend. She has been walking dogs and was out a lot in the extreme heat. She is wondering if this had something to do with it. She had some left arm tightness. She took 2 extra Lasix the next day and one extra Lasix for 2 more days. She is feeling better and lost the weight. She denies any excessive sodium intake. She had blood work last week and was told her renal function potassium were normal. She did take extra potassium when she took the extra Lasix. She denies any chest tightness or pressure. She says she's been having to take extra Lasix at least once a week sometimes twice a week. She is watching her diet and trying to lose weight that has been gained. She also complains of her legs aching since taking Lipitor every day and is asking if she can take it every other day  again.    Past Medical History  Diagnosis Date  . History of pneumonia   . Chronic systolic heart failure   . 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 Coronary atherosclerosis of native coronary artery     Severe two vessel disease   . Cocaine abuse in remission   . Raynaud's phenomenon   . Essential hypertension, benign   . Mixed hyperlipidemia   . Anticardiolipin antibody positive     Chronic Coumadin  . PVD (peripheral vascular disease)   . Ischemic cardiomyopathy     LVEF 30-35%  . Headache     Past Surgical History  Procedure Laterality Date  . Appendectomy    . Laryngeal polyp excision    . L1 corpectomy   12/10  . Cardiac defibrillator placement      St[employer]Jude ICD  . Abdominal aortagram N/A 05/25/2014    Procedure: ABDOMINAL Ronny Flurry;  Surgeon: Iran Ouch, MD;  Location: [facility] CATH LAB;  Service: Cardiovascular;  Laterality: N/A;     Current Outpatient Prescriptions  Medication Sig Dispense Refill  . aspirin EC 81 MG tablet Take 81 mg by mouth daily.    Marland Kitchen atorvastatin (LIPITOR) 10 MG tablet Take 10 mg by mouth daily.    . B Complex-Folic Acid (B COMPLEX-VITAMIN B12 PO) Take by mouth.    . Calcium Carbonate-Vitamin D (CALCIUM-VITAMIN D3 PO) Take by mouth.    . carvedilol (COREG) 12.5 MG tablet  TAKE 1 TABLET BY MOUTH TWICE DAILY WITH MEALS 90 tablet 2  . cetirizine (ZYRTEC) 10 MG tablet Take 10 mg by mouth daily.    . citalopram (CELEXA) 20 MG tablet Take 20 mg by mouth daily.     . Coenzyme Q10 (CO Q 10 PO) Take by mouth 2 (two) times daily.    Marland Kitchen docusate sodium (COLACE) 100 MG capsule Take 100 mg by mouth at bedtime.    . gabapentin (NEURONTIN) 300 MG capsule Take 300 mg by mouth 2 (two) times daily.    Marland Kitchen HYDROcodone-acetaminophen (NORCO) 10-325 MG per tablet Take 1 tablet by mouth 4 (four) times daily as needed for severe pain. pain    . ibuprofen (ADVIL,MOTRIN) 200 MG tablet Take 400 mg by mouth every 6 (six) hours as needed (PAIN). pain     . isosorbide mononitrate (IMDUR) 30 MG 24 hr tablet TAKE 1 TABLET BY MOUTH DAILY 90 tablet 1  . losartan (COZAAR) 25 MG tablet Take 0.5 tablets (12.5 mg total) by mouth daily. 45 tablet 3  . MISC NATURAL PRODUCTS PO Take 1 tablet by mouth 2 (two) times daily. [employer] RX OTC from [facility]    . Multiple Vitamins-Minerals (MULTIVITAMINS THER. W/MINERALS) TABS Take 1 tablet by mouth daily.      . nitroGLYCERIN (NITROSTAT) 0.4 MG SL tablet Place 1 tablet (0.4 mg total) under the tongue every 5 (five) minutes x 3 doses as needed for chest pain. 25 tablet 3  . ranitidine (ZANTAC) 150 MG tablet Take 150 mg by mouth 2 (two) times daily.    Marland Kitchen spironolactone (ALDACTONE) 25 MG tablet Take 1 tablet (25 mg total) by mouth daily. 90 tablet 3  . warfarin (COUMADIN) 10 MG tablet Take 5-10 mg by mouth daily. Take 1 tablet on Mon, Wed, Fri, and Sat.  Take 1/2 tablet on Sun, Tues, Thurs.    . furosemide (LASIX) 80 MG tablet Take 1 tablet (80 mg total) by mouth daily. 90 tablet 3  . potassium chloride SA ([name]-DUR,KLOR-CON) 20 MEQ tablet Take 1 tablet (20 mEq total) by mouth daily. 90 tablet 3  . promethazine (PHENERGAN) 25 MG tablet Take 25 mg by mouth every 6 (six) hours as needed for nausea.     No current facility-administered medications for this visit.    Allergies:   Penicillins; Metoclopramide hcl; Metoprolol; and Statins    Social History:  The patient  reports that she quit smoking about 21 months ago. Her smoking use included E-cigarettes. She started smoking about 37 years ago. She has never used smokeless tobacco. She reports that she does not drink alcohol or use illicit drugs.   Family History:  The patient's    Family history is unknown by patient.    ROS:  Please see the history of present illness.   Otherwise, review of systems are positive for leg aches from Lipitor.   All other systems are reviewed and negative.    PHYSICAL EXAM: VS:  BP 122/78 mmHg  Pulse 66  Ht 5\' 6"  (1.676 m)   Wt 204 lb (92.534 kg)  BMI 32.94 kg/m2  SpO2 98% , BMI Body mass index is 32.94 kg/(m^2). GEN: Well nourished, well developed, in no acute distress Neck: no JVD, HJR, carotid bruits, or masses Cardiac: RRR; no murmurs,gallop, rubs, thrill or heave,  Respiratory:  clear to auscultation bilaterally, normal work of breathing GI: soft, nontender, nondistended, + BS MS: no deformity or atrophy Extremities: without cyanosis, clubbing, edema, good distal  pulses bilaterally.  Skin: warm and dry, no rash Neuro:  Strength and sensation are intact    EKG:  EKG is not ordered today.    Recent Labs: 03/01/2015: B Natriuretic Peptide 141.0*; BUN 9; Creatinine, Ser 0.64; Hemoglobin 12.6; Platelets 105*; Potassium 4.6; Sodium 137    Lipid Panel    Component Value Date/Time   CHOL 162 10/14/2013 0855   TRIG 189* 10/14/2013 0855   HDL 33* 10/14/2013 0855   CHOLHDL 4.9 10/14/2013 0855   VLDL 38 10/14/2013 0855   LDLCALC 91 10/14/2013 0855      Wt Readings from Last 3 Encounters:  07/26/15 204 lb (92.534 kg)  07/12/15 206 lb 1.9 oz (93.495 kg)  07/02/15 203 lb (92.08 kg)      Other studies Reviewed: Additional studies/ records that were reviewed today include and review of the records demonstrates:    ASSESSMENT AND PLAN:  Acute on chronic systolic heart failure Patient had an 11 pound weight gain that has now resolved with extra diuretics. She is having to take extra Lasix every week and is asking to increase her dose. Will increase her Lasix to 80 mg daily [name] [name] 20 mEq daily recheck bmet next week. Follow-up with Dr. Diona Browner.  Cardiomyopathy, ischemic EF 20% on recent echo admission hospital in [place] in April. Recent trouble with increased edema. Increasing Lasix.  HYPERLIPIDEMIA Patient complaining of worsening leg cramps on Lipitor every day. I told her she could try taking it every other day to see if this helps.  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    Elson Clan, PA-C  07/26/2015 2:24 PM    [facility] [address] [place], [place], [place]  [address] Phone: [phone]; Fax: [phone]

## 2015-07-26 NOTE — Assessment & Plan Note (Signed)
Patient had an 11 pound weight gain that has now resolved with extra diuretics. She is having to take extra Lasix every week and is asking to increase her dose. Will increase her Lasix to 80 mg daily K Doerr 20 mEq daily recheck bmet next week. Follow-up with Dr. Diona Browner.

## 2015-07-26 NOTE — Assessment & Plan Note (Signed)
EF 20% on recent echo admission hospital in [place] in April. Recent trouble with increased edema. Increasing Lasix.

## 2015-07-26 NOTE — Assessment & Plan Note (Signed)
Patient complaining of worsening leg cramps on Lipitor every day. I told her she could try taking it every other day to see if this helps.

## 2015-07-26 NOTE — Assessment & Plan Note (Signed)
On long-term Coumadin.

## 2015-07-27 ENCOUNTER — Ambulatory Visit (INDEPENDENT_AMBULATORY_CARE_PROVIDER_SITE_OTHER): Payer: Medicaid Other | Admitting: *Deleted

## 2015-07-27 DIAGNOSIS — Z5181 Encounter for therapeutic drug level monitoring: Secondary | ICD-10-CM | POA: Diagnosis not present

## 2015-07-27 LAB — POCT INR: INR: 1.5

## 2015-07-31 ENCOUNTER — Telehealth: Payer: Self-pay | Admitting: Cardiology

## 2015-07-31 NOTE — Telephone Encounter (Signed)
Having issue with her BP dropping since medication change

## 2015-07-31 NOTE — Telephone Encounter (Signed)
Patient contacted to get more information about her c/o BP dropping since medication change. Spoke with patient and she informed nurse that she was started on losartan 12.5 mg by Diona Browner on 06/20/15 and said her BP has been dropping ever since. Patient was unable to give BP readings to nurse due to her not being at home. Patient informed nurse that she would call back tomorrow with the readings since she was not at home. Patient informed that she has been in the office twice since that medication change and she didn't c/o of her BP dropping. Patient said that on yesterday, she noticed that she felt dizzy when she stood up. Patient said her weight decreased by 10 lbs with recent dose increase of lasix by NP last week. Patient also said she has been diagnosed with orthostatic blood pressure. Will await patient to call office back tomorrow with BP readings.

## 2015-08-03 NOTE — Telephone Encounter (Signed)
Patient never returned call to report readings of BP so nurse reached out to patient. Please see the blood pressure readings below:  BP 89/62 HR 83 BP 98/66 HR 93 BP 93/67 HR 90 BP 116/68 HR 83 BP 76/57 HR 79 BP 99/70 HR 83 BP 129/85 HR 74 BP 80/55 HR 72 BP 105/59 HR 62 BP 126/80 HR 66 BP 107/70 HR 80 BP 116/75 HR 83 BP 86/59 HR 84  Patient advised to continue monitoring her blood pressure only twice a day and bring readings to her up coming visit so that MD could review them at that time. Patient verbalized understanding of plan. BP 99/72 HR 78

## 2015-08-04 ENCOUNTER — Encounter: Payer: Self-pay | Admitting: Cardiology

## 2015-08-09 ENCOUNTER — Encounter: Payer: Self-pay | Admitting: Internal Medicine

## 2015-08-10 ENCOUNTER — Ambulatory Visit (INDEPENDENT_AMBULATORY_CARE_PROVIDER_SITE_OTHER): Payer: Medicaid Other | Admitting: *Deleted

## 2015-08-10 DIAGNOSIS — Z5181 Encounter for therapeutic drug level monitoring: Secondary | ICD-10-CM

## 2015-08-10 LAB — POCT INR: INR: 1.6

## 2015-08-24 ENCOUNTER — Telehealth: Payer: Self-pay | Admitting: Cardiology

## 2015-08-24 ENCOUNTER — Other Ambulatory Visit (HOSPITAL_COMMUNITY): Payer: Self-pay | Admitting: Interventional Radiology

## 2015-08-24 ENCOUNTER — Ambulatory Visit (INDEPENDENT_AMBULATORY_CARE_PROVIDER_SITE_OTHER): Payer: Medicaid Other | Admitting: *Deleted

## 2015-08-24 DIAGNOSIS — I639 Cerebral infarction, unspecified: Secondary | ICD-10-CM

## 2015-08-24 DIAGNOSIS — Z5181 Encounter for therapeutic drug level monitoring: Secondary | ICD-10-CM

## 2015-08-24 DIAGNOSIS — R531 Weakness: Secondary | ICD-10-CM

## 2015-08-24 LAB — POCT INR: INR: 3

## 2015-08-24 NOTE — Telephone Encounter (Signed)
Patient advised that lab order for BMET was faxed to [facility] lab.

## 2015-08-24 NOTE — Telephone Encounter (Signed)
Patient is here getting CCR checked needs labs work to do labs at [facility]

## 2015-08-29 ENCOUNTER — Ambulatory Visit (INDEPENDENT_AMBULATORY_CARE_PROVIDER_SITE_OTHER): Payer: Medicaid Other | Admitting: Cardiology

## 2015-08-29 ENCOUNTER — Encounter: Payer: Self-pay | Admitting: *Deleted

## 2015-08-29 ENCOUNTER — Encounter: Payer: Self-pay | Admitting: Cardiology

## 2015-08-29 VITALS — BP 101/67 | HR 59 | Ht 66.0 in | Wt 208.4 lb

## 2015-08-29 DIAGNOSIS — I5022 Chronic systolic (congestive) heart failure: Secondary | ICD-10-CM

## 2015-08-29 DIAGNOSIS — I255 Ischemic cardiomyopathy: Secondary | ICD-10-CM

## 2015-08-29 NOTE — Progress Notes (Signed)
Cardiology Office Note  Date: 08/29/2015   ID: [name] [name], DOB June 20, 1961, MRN [id number]  PCP: Fredirick Maudlin, MD  Primary Cardiologist: Nona Dell, MD   Chief Complaint  Patient presents with  . Chronic systolic heart failure  . Coronary Artery Disease    History of Present Illness: [name] [name] is a medically complex 54 y.o. female last seen in July by Ms. Geni Bers PA-C. She was evaluated at that time for episodic weight gain that improved with extra diuretics. She comes in today for a follow-up visit. Overall no change in functional capacity. She actually walks regularly because she has been taking care of some dogs for a friend. She reports NYHA class II dyspnea. Her weight continues to be a problem, and she admits that her diet is "terrible." She is looking seriously at different weight loss plans. Also has been taking her medications regularly by report, periodically has to take additional diuretics. She did have follow-up lab work done at [place] recently, results pending.  We reviewed her medications, outlined below. Still limited in titration based on low to low normal blood pressure and heart rate.  She denies any palpitations, syncope, or device shocks.   Past Medical History  Diagnosis Date  . History of pneumonia   . Chronic systolic heart failure   . 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 Coronary atherosclerosis of native coronary artery     Severe two vessel disease   . Cocaine abuse in remission   . Raynaud's phenomenon   . Essential hypertension, benign   . Mixed hyperlipidemia   . Anticardiolipin antibody positive     Chronic Coumadin  . PVD (peripheral vascular disease)   . Ischemic cardiomyopathy     LVEF 30-35%  . Headache     Current Outpatient Prescriptions  Medication Sig Dispense Refill  . aspirin EC 81 MG tablet Take 81 mg by mouth daily.    Marland Kitchen atorvastatin (LIPITOR) 10 MG tablet Take 10 mg by mouth daily.      . B Complex-Folic Acid (B COMPLEX-VITAMIN B12 PO) Take by mouth.    . carvedilol (COREG) 12.5 MG tablet TAKE 1 TABLET BY MOUTH TWICE DAILY WITH MEALS 90 tablet 2  . cetirizine (ZYRTEC) 10 MG tablet Take 10 mg by mouth daily.    . citalopram (CELEXA) 20 MG tablet Take 20 mg by mouth daily.     Marland Kitchen docusate sodium (COLACE) 100 MG capsule Take 100 mg by mouth at bedtime.    . Folic Acid-B6-B12-Coenzyme T01 (RX ESSENTIALS CHOLESTEROL PO) Take 1 tablet by mouth every morning.    . furosemide (LASIX) 80 MG tablet Take 1 tablet (80 mg total) by mouth daily. 90 tablet 3  . gabapentin (NEURONTIN) 300 MG capsule Take 300 mg by mouth 2 (two) times daily.    Marland Kitchen HYDROcodone-acetaminophen (NORCO) 10-325 MG per tablet Take 1 tablet by mouth 4 (four) times daily as needed for severe pain. pain    . ibuprofen (ADVIL,MOTRIN) 200 MG tablet Take 400 mg by mouth every 6 (six) hours as needed (PAIN). pain    . isosorbide mononitrate (IMDUR) 30 MG 24 hr tablet TAKE 1 TABLET BY MOUTH DAILY 90 tablet 1  . losartan (COZAAR) 25 MG tablet Take 0.5 tablets (12.5 mg total) by mouth daily. 45 tablet 3  . MISC NATURAL PRODUCTS PO Take 1 tablet by mouth 2 (two) times daily. [employer] RX OTC from [facility]    .  Multiple Vitamins-Minerals (MULTIVITAMINS THER. W/MINERALS) TABS Take 1 tablet by mouth daily.      . nitroGLYCERIN (NITROSTAT) 0.4 MG SL tablet Place 1 tablet (0.4 mg total) under the tongue every 5 (five) minutes x 3 doses as needed for chest pain. 25 tablet 3  . potassium chloride SA (K-DUR,KLOR-CON) 20 MEQ tablet Take 1 tablet (20 mEq total) by mouth daily. 90 tablet 3  . promethazine (PHENERGAN) 25 MG tablet Take 25 mg by mouth every 6 (six) hours as needed for nausea.    . ranitidine (ZANTAC) 150 MG tablet Take 150 mg by mouth 2 (two) times daily.    Marland Kitchen spironolactone (ALDACTONE) 25 MG tablet Take 1 tablet (25 mg total) by mouth daily. 90 tablet 3  . warfarin (COUMADIN) 10 MG tablet Take 5-10 mg by mouth daily.  Take 1 tablet on Mon, Wed, Fri, and Sat.  Take 1/2 tablet on Sun, Tues, Thurs.     No current facility-administered medications for this visit.    Allergies:  Penicillins; Metoclopramide hcl; Metoprolol; and Statins   Social History: The patient  reports that she quit smoking about 22 months ago. Her smoking use included E-cigarettes. She started smoking about 37 years ago. She has never used smokeless tobacco. She reports that she does not drink alcohol or use illicit drugs.   ROS:  Please see the history of present illness. Otherwise, complete review of systems is positive for chronic back and left leg pain.  All other systems are reviewed and negative.   Physical Exam: VS:  BP 101/67 mmHg  Pulse 59  Ht 5\' 6"  (1.676 m)  Wt 208 lb 6.4 oz (94.53 kg)  BMI 33.65 kg/m2  SpO2 98%, BMI Body mass index is 33.65 kg/(m^2).  Wt Readings from Last 3 Encounters:  08/29/15 208 lb 6.4 oz (94.53 kg)  07/26/15 204 lb (92.534 kg)  07/12/15 206 lb 1.9 oz (93.495 kg)     General: Overweight woman, appears comfortable at rest. HEENT: Conjunctiva and lids normal, oropharynx clear. Neck: Supple, no elevated JVP, no thyromegaly. Lungs: Clear to auscultation decreased at the bases, nonlabored breathing at rest. Cardiac: Indistinct PMI, regular rate and rhythm, no S3, no pericardial rub. Abdomen: Soft but protuberant, nontender, bowel sounds present, no guarding or rebound. Extremities: Trace edema, distal pulses 2+. Skin: Warm and dry. Musculoskeletal: No kyphosis. Neuropsychiatric: Alert and oriented x3, affect grossly appropriate.   ECG: ECG is not ordered today.   Recent Labwork: 03/01/2015: B Natriuretic Peptide 141.0*; BUN 9; Creatinine, Ser 0.64; Hemoglobin 12.6; Platelets 105*; Potassium 4.6; Sodium 137  07/11/2015: BUN 21, creatinine 0.6, potassium 4.2, LDL 71, HDL 59, triglycerides 59, cholesterol 142.  Other Studies Reviewed Today:  Workup back in April at [facility] in [place]  included a myocardial perfusion imaging which showed no active ischemia with fixed defects in the anterolateral and apical regions, LVEF 16%. Echocardiogram reported LVEF of approximately 20% with inferior, posterior, and apical akinesis, mild LVH with mild chamber dilatation, normal RV contraction, moderate left atrial enlargement, device wire present in the right heart, moderate mitral regurgitation, mild tricuspid regurgitation, RVSP not calculated.  Assessment and Plan:  1. Chronic systolic heart failure with ischemic cardiomyopathy as outlined above. Plan is to continue current cardiac regimen. I have encouraged her to pursue a diet to help with caloric restriction and weight loss. She will also be maintaining a on check daily weights, adjusting Lasix as needed for more acute weight change. Follow-up on recent BMET results at [facility].  2. St. Jude ICD in place, no palpitations, syncope, or device shocks.  3. Anticardiolipin antibody positive with previous history of stroke. She continues on Coumadin as well as aspirin.  Current medicines were reviewed with the patient today.   Orders Placed This Encounter  Procedures  . Basic metabolic panel    Disposition: FU with me in 6 weeks.   Signed, [name], MD, [facility] 08/29/2015 1:23 PM    [facility] at [facility] [address] [place], [place], [place] [address] Phone: [phone]; Fax: [phone]

## 2015-08-29 NOTE — Patient Instructions (Signed)
Continue all current medications. Lab for BMET - do just prior to next office visit. Follow up in  6 weeks.

## 2015-08-30 ENCOUNTER — Telehealth: Payer: Self-pay | Admitting: Cardiology

## 2015-08-30 NOTE — Telephone Encounter (Signed)
Patient informed. 

## 2015-08-30 NOTE — Telephone Encounter (Signed)
-----   Message from [name], MD sent at 08/30/2015 10:28 AM EDT ----- Reviewed. Renal function and potassium remained normal on current regimen. No changes.

## 2015-08-30 NOTE — Telephone Encounter (Signed)
-----   Message from Samuel G McDowell, MD sent at 08/30/2015 10:28 AM EDT ----- Reviewed. Renal function and potassium remained normal on current regimen. No changes. 

## 2015-08-30 NOTE — Telephone Encounter (Signed)
Returned someone's call °

## 2015-09-13 ENCOUNTER — Ambulatory Visit (HOSPITAL_COMMUNITY): Payer: Medicaid Other

## 2015-09-14 ENCOUNTER — Ambulatory Visit (INDEPENDENT_AMBULATORY_CARE_PROVIDER_SITE_OTHER): Payer: Medicaid Other | Admitting: *Deleted

## 2015-09-14 DIAGNOSIS — Z5181 Encounter for therapeutic drug level monitoring: Secondary | ICD-10-CM | POA: Diagnosis not present

## 2015-09-14 LAB — POCT INR: INR: 3.4

## 2015-09-22 ENCOUNTER — Telehealth: Payer: Self-pay

## 2015-09-22 NOTE — Telephone Encounter (Signed)
Received records request [employer], forwarded to [employer] for processing.  

## 2015-09-25 ENCOUNTER — Ambulatory Visit (HOSPITAL_COMMUNITY): Admission: RE | Admit: 2015-09-25 | Payer: Medicaid Other | Source: Ambulatory Visit

## 2015-10-05 ENCOUNTER — Ambulatory Visit (INDEPENDENT_AMBULATORY_CARE_PROVIDER_SITE_OTHER): Payer: Medicaid Other | Admitting: *Deleted

## 2015-10-05 DIAGNOSIS — Z5181 Encounter for therapeutic drug level monitoring: Secondary | ICD-10-CM | POA: Diagnosis not present

## 2015-10-05 DIAGNOSIS — I679 Cerebrovascular disease, unspecified: Secondary | ICD-10-CM | POA: Diagnosis not present

## 2015-10-05 LAB — POCT INR: INR: 1.8

## 2015-10-06 ENCOUNTER — Ambulatory Visit (HOSPITAL_COMMUNITY)
Admission: RE | Admit: 2015-10-06 | Discharge: 2015-10-06 | Disposition: A | Discharge status: Home / Self Care | Payer: Medicaid Other | Source: Ambulatory Visit | Attending: Interventional Radiology | Admitting: Interventional Radiology

## 2015-10-06 DIAGNOSIS — R531 Weakness: Secondary | ICD-10-CM

## 2015-10-06 DIAGNOSIS — I639 Cerebral infarction, unspecified: Secondary | ICD-10-CM

## 2015-10-16 ENCOUNTER — Ambulatory Visit (INDEPENDENT_AMBULATORY_CARE_PROVIDER_SITE_OTHER): Payer: Medicaid Other | Admitting: *Deleted

## 2015-10-16 DIAGNOSIS — I255 Ischemic cardiomyopathy: Secondary | ICD-10-CM

## 2015-10-16 NOTE — Progress Notes (Signed)
Remote ICD transmission.   

## 2015-10-17 ENCOUNTER — Encounter: Payer: Self-pay | Admitting: Cardiology

## 2015-10-17 ENCOUNTER — Ambulatory Visit (INDEPENDENT_AMBULATORY_CARE_PROVIDER_SITE_OTHER): Payer: Medicaid Other | Admitting: *Deleted

## 2015-10-17 ENCOUNTER — Other Ambulatory Visit (HOSPITAL_COMMUNITY): Payer: Self-pay | Admitting: Interventional Radiology

## 2015-10-17 ENCOUNTER — Telehealth: Payer: Self-pay | Admitting: *Deleted

## 2015-10-17 ENCOUNTER — Ambulatory Visit (INDEPENDENT_AMBULATORY_CARE_PROVIDER_SITE_OTHER): Payer: Medicaid Other | Admitting: Cardiology

## 2015-10-17 ENCOUNTER — Telehealth (HOSPITAL_COMMUNITY): Payer: Self-pay | Admitting: Interventional Radiology

## 2015-10-17 VITALS — BP 105/70 | HR 62 | Ht 66.0 in | Wt 207.4 lb

## 2015-10-17 DIAGNOSIS — I5022 Chronic systolic (congestive) heart failure: Secondary | ICD-10-CM

## 2015-10-17 DIAGNOSIS — R531 Weakness: Secondary | ICD-10-CM

## 2015-10-17 DIAGNOSIS — Z5181 Encounter for therapeutic drug level monitoring: Secondary | ICD-10-CM | POA: Diagnosis not present

## 2015-10-17 DIAGNOSIS — I63549 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: Secondary | ICD-10-CM

## 2015-10-17 DIAGNOSIS — I255 Ischemic cardiomyopathy: Secondary | ICD-10-CM

## 2015-10-17 DIAGNOSIS — D6859 Other primary thrombophilia: Secondary | ICD-10-CM | POA: Diagnosis not present

## 2015-10-17 DIAGNOSIS — I679 Cerebrovascular disease, unspecified: Secondary | ICD-10-CM | POA: Diagnosis not present

## 2015-10-17 LAB — CUP PACEART REMOTE DEVICE CHECK
Battery Voltage: 2.7 V
Brady Statistic AS VS Percent: 100 %
Date Time Interrogation Session: 20161018092805
HighPow Impedance: 53 Ohm
Implantable Lead Implant Date: 20081119
Implantable Lead Implant Date: 20081119
Implantable Lead Location: 753859
Implantable Lead Location: 753860
Implantable Lead Model: 185
Implantable Lead Model: 4136
Implantable Lead Serial Number: 211124
Implantable Lead Serial Number: 28383796
Lead Channel Impedance Value: 535 Ohm
Lead Channel Impedance Value: 582 Ohm
Lead Channel Sensing Intrinsic Amplitude: 2.9 mV
Lead Channel Sensing Intrinsic Amplitude: 25 mV
Lead Channel Setting Pacing Amplitude: 2 V
Lead Channel Setting Pacing Amplitude: 2.4 V
Lead Channel Setting Pacing Pulse Width: 0.5 ms
Pulse Gen Serial Number: 504498

## 2015-10-17 LAB — POCT INR: INR: 2.8

## 2015-10-17 NOTE — Progress Notes (Signed)
Cardiology Office Note  Date: 10/17/2015   ID: [name] [name], DOB March 24, 1961, MRN [id number]  PCP: Fredirick Maudlin, MD  Primary Cardiologist: Nona Dell, MD   Chief Complaint  Patient presents with  . Cardiomyopathy  . Congestive Heart Failure    History of Present Illness: [name] [name] is a medically complex 54 y.o. female last seen in August. She presents for a routine follow-up visit. She has had no substantial change in weight since I saw her in August. We have discussed caloric restriction, she does not endorse any leg swelling. We reviewed her medications which are outlined below. She does report episodes of symptomatic hypotension, and we have been very limited in advancing her regimen any further.  ICD follow-up continues in the device clinic. Recent interrogation showed no mode switches or ventricular arrhythmias. She has had no device shocks.  Records indicate that she is being considered for an angiographic procedure with Dr. Corliss Skains, and needs to come off Coumadin with Lovenox bridge. This is being coordinated through the anticoagulation clinic.   Past Medical History  Diagnosis Date  . History of pneumonia   . Chronic systolic heart failure (HCC)   . 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 Coronary atherosclerosis of native coronary artery     Severe two vessel disease   . Cocaine abuse in remission   . Raynaud's phenomenon   . Essential hypertension, benign   . Mixed hyperlipidemia   . Anticardiolipin antibody positive     Chronic Coumadin  . PVD (peripheral vascular disease) (HCC)   . Ischemic cardiomyopathy     LVEF 30-35%  . Headache     Current Outpatient Prescriptions  Medication Sig Dispense Refill  . aspirin EC 81 MG tablet Take 81 mg by mouth daily.    Marland Kitchen atorvastatin (LIPITOR) 10 MG tablet Take 10 mg by mouth daily.    . B Complex-Folic Acid (B COMPLEX-VITAMIN B12 PO) Take by mouth.    . carvedilol (COREG)  12.5 MG tablet TAKE 1 TABLET BY MOUTH TWICE DAILY WITH MEALS 90 tablet 2  . cetirizine (ZYRTEC) 10 MG tablet Take 10 mg by mouth daily.    . citalopram (CELEXA) 20 MG tablet Take 20 mg by mouth daily.     Marland Kitchen docusate sodium (COLACE) 100 MG capsule Take 100 mg by mouth at bedtime.    . Folic Acid-B6-B12-Coenzyme C62 (RX ESSENTIALS CHOLESTEROL PO) Take 1 tablet by mouth every morning.    . furosemide (LASIX) 80 MG tablet Take 1 tablet (80 mg total) by mouth daily. 90 tablet 3  . gabapentin (NEURONTIN) 300 MG capsule Take 300 mg by mouth 2 (two) times daily.    Marland Kitchen HYDROcodone-acetaminophen (NORCO) 10-325 MG per tablet Take 1 tablet by mouth 4 (four) times daily as needed for severe pain. pain    . ibuprofen (ADVIL,MOTRIN) 200 MG tablet Take 400 mg by mouth every 6 (six) hours as needed (PAIN). pain    . isosorbide mononitrate (IMDUR) 30 MG 24 hr tablet TAKE 1 TABLET BY MOUTH DAILY 90 tablet 1  . losartan (COZAAR) 25 MG tablet Take 0.5 tablets (12.5 mg total) by mouth daily. 45 tablet 3  . methocarbamol (ROBAXIN) 500 MG tablet Take 500 mg by mouth as needed for muscle spasms.    Marland Kitchen MISC NATURAL PRODUCTS PO Take 1 tablet by mouth 2 (two) times daily. [employer] RX OTC from [facility]    . Multiple  Vitamins-Minerals (MULTIVITAMINS THER. W/MINERALS) TABS Take 1 tablet by mouth daily.      . nitroGLYCERIN (NITROSTAT) 0.4 MG SL tablet Place 1 tablet (0.4 mg total) under the tongue every 5 (five) minutes x 3 doses as needed for chest pain. 25 tablet 3  . potassium chloride SA (K-DUR,KLOR-CON) 20 MEQ tablet Take 1 tablet (20 mEq total) by mouth daily. 90 tablet 3  . promethazine (PHENERGAN) 25 MG tablet Take 25 mg by mouth every 6 (six) hours as needed for nausea.    . ranitidine (ZANTAC) 150 MG tablet Take 150 mg by mouth 2 (two) times daily.    Marland Kitchen spironolactone (ALDACTONE) 25 MG tablet Take 1 tablet (25 mg total) by mouth daily. 90 tablet 3  . warfarin (COUMADIN) 10 MG tablet Take 5-10 mg by mouth daily.  Take 1 tablet on Mon, Wed, Fri, and Sat.  Take 1/2 tablet on Sun, Tues, Thurs.     No current facility-administered medications for this visit.    Allergies:  Penicillins; Metoclopramide hcl; Metoprolol; and Statins   Social History: The patient  reports that she quit smoking about 2 years ago. Her smoking use included E-cigarettes. She started smoking about 37 years ago. She has never used smokeless tobacco. She reports that she does not drink alcohol or use illicit drugs.   ROS:  Please see the history of present illness. Otherwise, complete review of systems is positive for recent visual changes prompting further neurological evaluation. Also left knee pain.  All other systems are reviewed and negative.   Physical Exam: VS:  BP 105/70 mmHg  Pulse 62  Ht  (1.676 m)  Wt 207 lb 6.4 oz (94.076 kg)  BMI 33.49 kg/m2  SpO2 99%, BMI Body mass index is 33.49 kg/(m^2).  Wt Readings from Last 3 Encounters:  10/17/15 207 lb 6.4 oz (94.076 kg)  08/29/15 208 lb 6.4 oz (94.53 kg)  07/26/15 204 lb (92.534 kg)     .General: Overweight woman, appears comfortable at rest. HEENT: Conjunctiva and lids normal, oropharynx clear. Neck: Supple, no elevated JVP, no thyromegaly. Lungs: Clear to auscultation decreased at the bases, nonlabored breathing at rest. Cardiac: Indistinct PMI, regular rate and rhythm, no S3, no pericardial rub. Abdomen: Soft but protuberant, nontender, bowel sounds present, no guarding or rebound. Extremities: Trace edema, distal pulses 2+.  ECG: ECG is not ordered today.   Recent Labwork: 03/01/2015: B Natriuretic Peptide 141.0*; BUN 9; Creatinine, Ser 0.64; Hemoglobin 12.6; Platelets 105*; Potassium 4.6; Sodium 137  August 2016: BUN 12, creatinine 0.6, potassium 4.1  Other Studies Reviewed Today:  Workup back in April at [facility] in [place] included a myocardial perfusion imaging which showed no active ischemia with fixed defects in the anterolateral and  apical regions, LVEF 16%. Echocardiogram reported LVEF of approximately 20% with inferior, posterior, and apical akinesis, mild LVH with mild chamber dilatation, normal RV contraction, moderate left atrial enlargement, device wire present in the right heart, moderate mitral regurgitation, mild tricuspid regurgitation, RVSP not calculated.  Assessment and Plan:  1. Ischemic cardiomyopathy with chronic systolic heart failure, LVEF approximately 20%. No significant change in weight. We will maintain present diuretic regimen and follow-up with BMET for next visit. Blood pressure limits further up titration of medical therapy.  2. Anticardiolipin antibody positive with history of stroke in the past. She is on chronic Coumadin. Records indicate plan for angiographic procedure with Dr. Corliss Skains, she will be bridged with Lovenox while off Coumadin, coordinated by anticoagulation clinic.  Current medicines were reviewed with the patient today.   Orders Placed This Encounter  Procedures  . Basic metabolic panel    Disposition: FU with me in 3 months.   Signed, [name], MD, [facility] 10/17/2015 2:19 PM    [facility] at [facility] [address] [place] [address] [place], [place], [place] [address] Phone: [phone]; Fax: [phone]

## 2015-10-17 NOTE — Telephone Encounter (Signed)
-----   Message from Shirlyn Goltz sent at 10/17/2015  7:58 AM EDT ----- Regarding: RE: Coumadin Ok thank you. I will call the patient and let her know and we can go ahead and schedule her procedure with enough time for her to take care of the medication switch as well.  Thanks again, Victorino Dike  ----- Message -----    From: Louanna Raw, RN    Sent: 10/16/2015   5:01 PM      To: Shirlyn Goltz Subject: RE: Coumadin                                   Yes, this can be done.  I need a procedure date then I can arrange her Lovenox bridge.  I will need to see her at least 1 week before her procedure. Thanks, Misty Stanley ----- Message -----    From: Shirlyn Goltz    Sent: 10/16/2015  11:51 AM      To: Louanna Raw, RN Subject: Coumadin                                       Dyanne Carrel,  I work with Dr. Kerby Nora and [name] [name] has recently been seen in our office. Deveshwar wants to perform a catheter angiogram on this patient however, he needs her to be off of her Coumadin and on a Lovenox bridge for 3 days prior to her procedure. Can this be done? Please advise.  Thank you,  Karle Barr

## 2015-10-17 NOTE — Telephone Encounter (Signed)
Called pt, left VM for her to call me back to set up her angiogram with Deveshwar. JM

## 2015-10-17 NOTE — Patient Instructions (Signed)
Your physician recommends that you continue on your current medications as directed. Please refer to the Current Medication list given to you today. Your physician recommends that you return for lab work in 3 months just before your next visit in January 2017 to check your BMET. Your physician recommends that you schedule a follow-up appointment in: 3 months.

## 2015-10-23 ENCOUNTER — Telehealth (HOSPITAL_COMMUNITY): Payer: Self-pay | Admitting: Interventional Radiology

## 2015-10-23 NOTE — Telephone Encounter (Signed)
Called pt, left VM for her to call to schedule cerebral angio. JM

## 2015-10-24 ENCOUNTER — Telehealth: Payer: Self-pay | Admitting: *Deleted

## 2015-10-24 NOTE — Telephone Encounter (Signed)
Please call Mrs. Korman in regards to upcoming procedure and Lovenox.  Please call her # [phone]

## 2015-10-26 MED ORDER — ENOXAPARIN SODIUM 100 MG/ML ~~LOC~~ SOLN: 100.0000 mg | Freq: Two times a day (BID) | SUBCUTANEOUS | Status: DC

## 2015-10-26 NOTE — Telephone Encounter (Signed)
Spoke with pt.  She is scheduled for some type of angiography by Dr Corliss Skains on 11/03/15.  Coumadin will need to be held 5 days before and bridged with Lovenox.  Instructions given to pt and prescription sent to [facility].  Weight 94 kg Labs 08/28/15:  SrCr 0.64  CrCl 152.59  10/29  Take last dose of coumadin 10/30  No lovenox or coumadin 10/31  Lovenox 100mg  sq twice daily at 8am & 8pm 11/1    Lovenox 100mg  sq twice daily at 8am & 8pm 11/2    Lovenox 100mg  sq twice daily at 8am & 8pm 11/3    Lovenox 100mg  sq  8am & no lovenox pm 11/4    No lovenox in am----procedure 7am----coumadin 15mg  pm (to restart lovenox in pm if ok with Dr Corliss Skains) 11/5    Lovenox 100mg  sq twice daily at 8am & 8pm and coumadin 15mg  pm 11/6   Lovenox 100mg  sq twice daily at 8am & 8pm and coumadin 15mg  pm 11/7   Lovenox 100mg  sq twice daily at 8am & 8pm and coumadin 15mg  pm 11/8   Lovenox 100mg  sq 8am and INR appt at 1:40pm

## 2015-10-26 NOTE — Addendum Note (Signed)
Addended by: Louanna Raw on: 10/26/2015 02:41 PM   Modules accepted: Orders, Medications

## 2015-10-27 ENCOUNTER — Encounter: Payer: Self-pay | Admitting: Cardiology

## 2015-10-30 ENCOUNTER — Telehealth: Payer: Self-pay | Admitting: *Deleted

## 2015-10-30 NOTE — Telephone Encounter (Signed)
Told pt I personally thought she could hold the ASA since she is on Lovenox but advised her to call Dr Fatima Sanger office to see what her wants her to do.  She verbalized understanding.

## 2015-10-30 NOTE — Telephone Encounter (Signed)
[name] [name] started taking Lovenox injections today. Needs to know if she is suppose to continue taking ASA.

## 2015-11-01 ENCOUNTER — Encounter: Payer: Self-pay | Admitting: Internal Medicine

## 2015-11-02 ENCOUNTER — Other Ambulatory Visit: Payer: Self-pay | Admitting: Radiology

## 2015-11-03 ENCOUNTER — Encounter (HOSPITAL_COMMUNITY): Payer: Self-pay

## 2015-11-03 ENCOUNTER — Ambulatory Visit (HOSPITAL_COMMUNITY)
Admission: RE | Admit: 2015-11-03 | Discharge: 2015-11-03 | Disposition: A | Discharge status: Home / Self Care | Payer: Medicaid Other | Source: Ambulatory Visit | Attending: Interventional Radiology | Admitting: Interventional Radiology

## 2015-11-03 ENCOUNTER — Other Ambulatory Visit (HOSPITAL_COMMUNITY): Payer: Self-pay | Admitting: Interventional Radiology

## 2015-11-03 DIAGNOSIS — I2510 Atherosclerotic heart disease of native coronary artery without angina pectoris: Secondary | ICD-10-CM | POA: Diagnosis not present

## 2015-11-03 DIAGNOSIS — Z7982 Long term (current) use of aspirin: Secondary | ICD-10-CM | POA: Diagnosis not present

## 2015-11-03 DIAGNOSIS — R51 Headache: Secondary | ICD-10-CM | POA: Diagnosis not present

## 2015-11-03 DIAGNOSIS — I671 Cerebral aneurysm, nonruptured: Secondary | ICD-10-CM | POA: Insufficient documentation

## 2015-11-03 DIAGNOSIS — I5022 Chronic systolic (congestive) heart failure: Secondary | ICD-10-CM | POA: Diagnosis not present

## 2015-11-03 DIAGNOSIS — Z95810 Presence of automatic (implantable) cardiac defibrillator: Secondary | ICD-10-CM | POA: Insufficient documentation

## 2015-11-03 DIAGNOSIS — Z7901 Long term (current) use of anticoagulants: Secondary | ICD-10-CM | POA: Diagnosis not present

## 2015-11-03 DIAGNOSIS — I6521 Occlusion and stenosis of right carotid artery: Secondary | ICD-10-CM | POA: Diagnosis not present

## 2015-11-03 DIAGNOSIS — F1421 Cocaine dependence, in remission: Secondary | ICD-10-CM | POA: Insufficient documentation

## 2015-11-03 DIAGNOSIS — Z880 Allergy status to penicillin: Secondary | ICD-10-CM | POA: Insufficient documentation

## 2015-11-03 DIAGNOSIS — I255 Ischemic cardiomyopathy: Secondary | ICD-10-CM | POA: Insufficient documentation

## 2015-11-03 DIAGNOSIS — I110 Hypertensive heart disease with heart failure: Secondary | ICD-10-CM | POA: Insufficient documentation

## 2015-11-03 DIAGNOSIS — Z8673 Personal history of transient ischemic attack (TIA), and cerebral infarction without residual deficits: Secondary | ICD-10-CM | POA: Insufficient documentation

## 2015-11-03 DIAGNOSIS — R531 Weakness: Secondary | ICD-10-CM

## 2015-11-03 DIAGNOSIS — I7300 Raynaud's syndrome without gangrene: Secondary | ICD-10-CM | POA: Diagnosis not present

## 2015-11-03 DIAGNOSIS — G453 Amaurosis fugax: Secondary | ICD-10-CM | POA: Insufficient documentation

## 2015-11-03 DIAGNOSIS — Z87891 Personal history of nicotine dependence: Secondary | ICD-10-CM | POA: Insufficient documentation

## 2015-11-03 DIAGNOSIS — I63549 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: Secondary | ICD-10-CM

## 2015-11-03 DIAGNOSIS — E782 Mixed hyperlipidemia: Secondary | ICD-10-CM | POA: Insufficient documentation

## 2015-11-03 DIAGNOSIS — R200 Anesthesia of skin: Secondary | ICD-10-CM | POA: Insufficient documentation

## 2015-11-03 DIAGNOSIS — Z8701 Personal history of pneumonia (recurrent): Secondary | ICD-10-CM | POA: Diagnosis not present

## 2015-11-03 DIAGNOSIS — I739 Peripheral vascular disease, unspecified: Secondary | ICD-10-CM | POA: Diagnosis not present

## 2015-11-03 LAB — BASIC METABOLIC PANEL
Anion gap: 11 (ref 5–15)
BUN: 13 mg/dL (ref 6–20)
CO2: 29 mmol/L (ref 22–32)
Calcium: 9.4 mg/dL (ref 8.9–10.3)
Chloride: 100 mmol/L — ABNORMAL LOW (ref 101–111)
Creatinine, Ser: 0.91 mg/dL (ref 0.44–1.00)
GFR calc Af Amer: >60 mL/min (ref >60)
GFR calc non Af Amer: >60 mL/min (ref >60)
Glucose, Bld: 81 mg/dL (ref 65–99)
Potassium: 4.9 mmol/L (ref 3.5–5.1)
Sodium: 140 mmol/L (ref 135–145)

## 2015-11-03 LAB — CBC WITH DIFFERENTIAL/PLATELET
Basophils Absolute: 0.1 10*3/uL (ref 0.0–0.1)
Basophils Relative: 1 %
Eosinophils Absolute: 0.3 10*3/uL (ref 0.0–0.7)
Eosinophils Relative: 5 %
HCT: 40.3 % (ref 36.0–46.0)
Hemoglobin: 12.8 g/dL (ref 12.0–15.0)
Lymphocytes Relative: 38 %
Lymphs Abs: 2.2 10*3/uL (ref 0.7–4.0)
MCH: 29.4 pg (ref 26.0–34.0)
MCHC: 31.8 g/dL (ref 30.0–36.0)
MCV: 92.4 fL (ref 78.0–100.0)
Monocytes Absolute: 0.6 10*3/uL (ref 0.1–1.0)
Monocytes Relative: 10 %
Neutro Abs: 2.7 10*3/uL (ref 1.7–7.7)
Neutrophils Relative %: 46 %
Platelets: 146 10*3/uL — ABNORMAL LOW (ref 150–400)
RBC: 4.36 MIL/uL (ref 3.87–5.11)
RDW: 13.6 % (ref 11.5–15.5)
WBC: 5.9 10*3/uL (ref 4.0–10.5)

## 2015-11-03 LAB — PROTIME-INR
INR: 1.14 (ref 0.00–1.49)
Prothrombin Time: 14.8 seconds (ref 11.6–15.2)

## 2015-11-03 LAB — POCT ACTIVATED CLOTTING TIME: Activated Clotting Time: 147 seconds

## 2015-11-03 MED ORDER — LIDOCAINE HCL 1 % IJ SOLN
INTRAMUSCULAR | Status: AC
  Filled 2015-11-03: qty 20

## 2015-11-03 MED ORDER — FENTANYL CITRATE (PF) 100 MCG/2ML IJ SOLN
INTRAMUSCULAR | Status: AC
  Filled 2015-11-03: qty 2

## 2015-11-03 MED ORDER — FENTANYL CITRATE (PF) 100 MCG/2ML IJ SOLN
INTRAMUSCULAR | Status: AC | PRN
  Administered 2015-11-03: 25 ug via INTRAVENOUS
  Administered 2015-11-03 (×2): 12.5 ug via INTRAVENOUS

## 2015-11-03 MED ORDER — IOHEXOL 300 MG/ML  SOLN
200.0000 mL | Freq: Once | INTRAMUSCULAR | Status: DC | PRN
  Administered 2015-11-03: 80 mL via INTRAVENOUS
  Filled 2015-11-03: qty 200

## 2015-11-03 MED ORDER — HEPARIN SOD (PORK) LOCK FLUSH 100 UNIT/ML IV SOLN
500.0000 [IU] | Freq: Once | INTRAVENOUS | Status: AC
  Administered 2015-11-03: 500 [IU] via INTRAVENOUS

## 2015-11-03 MED ORDER — SODIUM CHLORIDE 0.9 % IV SOLN: INTRAVENOUS | Status: DC

## 2015-11-03 MED ORDER — HEPARIN SODIUM (PORCINE) 1000 UNIT/ML IJ SOLN
INTRAMUSCULAR | Status: AC
  Filled 2015-11-03: qty 1

## 2015-11-03 MED ORDER — MIDAZOLAM HCL 2 MG/2ML IJ SOLN
INTRAMUSCULAR | Status: AC
  Filled 2015-11-03: qty 2

## 2015-11-03 MED ORDER — SODIUM CHLORIDE 0.9 % IV SOLN
INTRAVENOUS | Status: DC
  Administered 2015-11-03: 08:00:00 via INTRAVENOUS

## 2015-11-03 MED ORDER — HEPARIN SODIUM (PORCINE) 1000 UNIT/ML IJ SOLN
1000.0000 [IU] | Freq: Once | INTRAMUSCULAR | Status: AC
  Administered 2015-11-03: 1000 [IU] via INTRAVENOUS

## 2015-11-03 MED ORDER — MIDAZOLAM HCL 2 MG/2ML IJ SOLN
INTRAMUSCULAR | Status: AC | PRN
  Administered 2015-11-03 (×2): 0.5 mg via INTRAVENOUS
  Administered 2015-11-03: 1 mg via INTRAVENOUS

## 2015-11-03 NOTE — Sedation Documentation (Signed)
Vital signs stable. 

## 2015-11-03 NOTE — Discharge Instructions (Signed)
Angiogram, Care After °Refer to this sheet in the next few weeks. These instructions provide you with information about caring for yourself after your procedure. Your health care provider may also give you more specific instructions. Your treatment has been planned according to current medical practices, but problems sometimes occur. Call your health care provider if you have any problems or questions after your procedure. °WHAT TO EXPECT AFTER THE PROCEDURE °After your procedure, it is typical to have the following: °· Bruising at the catheter insertion site that usually fades within 1-2 weeks. °· Blood collecting in the tissue (hematoma) that may be painful to the touch. It should usually decrease in size and tenderness within 1-2 weeks. °HOME CARE INSTRUCTIONS °· Take medicines only as directed by your health care provider. °· You may shower 24-48 hours after the procedure or as directed by your health care provider. Remove the bandage (dressing) and gently wash the site with plain soap and water. Pat the area dry with a clean towel. Do not rub the site, because this may cause bleeding. °· Do not take baths, swim, or use a hot tub until your health care provider approves. °· Check your insertion site every day for redness, swelling, or drainage. °· Do not apply powder or lotion to the site. °· Do not lift over 10 lb (4.5 kg) for 5 days after your procedure or as directed by your health care provider. °· Ask your health care provider when it is okay to: °¨ Return to work or school. °¨ Resume usual physical activities or sports. °¨ Resume sexual activity. °· Do not drive home if you are discharged the same day as the procedure. Have someone else drive you. °· You may drive 24 hours after the procedure unless otherwise instructed by your health care provider. °· Do not operate machinery or power tools for 24 hours after the procedure or as directed by your health care provider. °· If your procedure was done as an  outpatient procedure, which means that you went home the same day as your procedure, a responsible adult should be with you for the first 24 hours after you arrive home. °· Keep all follow-up visits as directed by your health care provider. This is important. °SEEK MEDICAL CARE IF: °· You have a fever. °· You have chills. °· You have increased bleeding from the catheter insertion site. Hold pressure on the site. °SEEK IMMEDIATE MEDICAL CARE IF: °· You have unusual pain at the catheter insertion site. °· You have redness, warmth, or swelling at the catheter insertion site. °· You have drainage (other than a small amount of blood on the dressing) from the catheter insertion site. °· The catheter insertion site is bleeding, and the bleeding does not stop after 30 minutes of holding steady pressure on the site. °· The area near or just beyond the catheter insertion site becomes pale, cool, tingly, or numb. °  °This information is not intended to replace advice given to you by your health care provider. Make sure you discuss any questions you have with your health care provider. °  °Document Released: 07/04/2005 Document Revised: 01/06/2015 Document Reviewed: 05/19/2013 °Elsevier Interactive Patient Education ©2016 [employer] ° °

## 2015-11-03 NOTE — Procedures (Signed)
S/P 4 vessel cerebral arteriogram. RT CFA approach. Findings. 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

## 2015-11-03 NOTE — Sedation Documentation (Signed)
Patient is resting

## 2015-11-03 NOTE — H&P (Signed)
Chief Complaint: Patient was seen in consultation today for cerebral artery stenosis at the request of Dr Nona Dell  Referring Physician(s): Diona Browner  History of Present Illness: [name] [name] is a 54 y.o. female   Pt with cardiac hx: Chronic heart failure anticardiolipin +---CVA 2007 Chronic coumadin (LD Sat 10/29) St Jude ICD implant 2008 Known R Internal carotid occlusion per doppler Now experiencing numbness and tingling L sided Headaches; visual disturbances x several weeks Consulted with Dr Corliss Skains 10/06/2015 Now scheduled for cerebral arteriogram   Past Medical History  Diagnosis Date  . History of pneumonia   . Chronic systolic heart failure (HCC)   . 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 Coronary atherosclerosis of native coronary artery     Severe two vessel disease   . Cocaine abuse in remission   . Raynaud's phenomenon   . Essential hypertension, benign   . Mixed hyperlipidemia   . Anticardiolipin antibody positive     Chronic Coumadin  . PVD (peripheral vascular disease) (HCC)   . Ischemic cardiomyopathy     LVEF 30-35%  . Headache     Past Surgical History  Procedure Laterality Date  . Appendectomy    . Laryngeal polyp excision    . L1 corpectomy   12/10  . Cardiac defibrillator placement      St.Jude ICD  . Abdominal aortagram N/A 05/25/2014    Procedure: ABDOMINAL Ronny Flurry;  Surgeon: Iran Ouch, MD;  Location: [facility] CATH LAB;  Service: Cardiovascular;  Laterality: N/A;    Allergies: Penicillins; Metoclopramide hcl; Metoprolol; and Statins  Medications: Prior to Admission medications   Medication Sig Start Date End Date Taking? Authorizing Provider  aspirin EC 81 MG tablet Take 81 mg by mouth daily.   Yes Historical Provider, MD  atorvastatin (LIPITOR) 10 MG tablet Take 10 mg by mouth daily.   Yes Historical Provider, MD  B Complex-Folic Acid (B COMPLEX-VITAMIN B12 PO) Take 1 tablet by mouth daily.     Yes Historical Provider, MD  carvedilol (COREG) 12.5 MG tablet TAKE 1 TABLET BY MOUTH TWICE DAILY WITH MEALS 07/17/15  Yes [name], MD  cetirizine (ZYRTEC) 10 MG tablet Take 10 mg by mouth 2 (two) times daily.    Yes Historical Provider, MD  citalopram (CELEXA) 20 MG tablet Take 20 mg by mouth daily.    Yes Historical Provider, MD  docusate sodium (COLACE) 100 MG capsule Take 100 mg by mouth at bedtime.   Yes Historical Provider, MD  enoxaparin (LOVENOX) 100 MG/ML injection Inject 1 mL (100 mg total) into the skin every 12 (twelve) hours. 10/30/15 11/07/15 Yes [name], MD  Folic Acid-B6-B12-Coenzyme W09 (RX ESSENTIALS CHOLESTEROL PO) Take 1 tablet by mouth every morning. [employer] RX OTC from [facility]   Yes Historical Provider, MD  furosemide (LASIX) 80 MG tablet Take 1 tablet (80 mg total) by mouth daily. 07/26/15  Yes Dyann Kief, PA-C  gabapentin (NEURONTIN) 300 MG capsule Take 300 mg by mouth 2 (two) times daily.   Yes Historical Provider, MD  HYDROcodone-acetaminophen (NORCO) 10-325 MG per tablet Take 0.5 tablets by mouth 4 (four) times daily as needed for moderate pain or severe pain. pain   Yes Historical Provider, MD  ibuprofen (ADVIL,MOTRIN) 200 MG tablet Take 400 mg by mouth every 6 (six) hours as needed (PAIN). pain   Yes Historical Provider, MD  isosorbide mononitrate (IMDUR) 30 MG 24 hr tablet  TAKE 1 TABLET BY MOUTH DAILY 07/20/15  Yes [name], MD  losartan (COZAAR) 25 MG tablet Take 0.5 tablets (12.5 mg total) by mouth daily. 06/20/15  Yes [name], MD  methocarbamol (ROBAXIN) 500 MG tablet Take 500 mg by mouth as needed for muscle spasms.   Yes Historical Provider, MD  Multiple Vitamins-Minerals (MULTIVITAMINS THER. W/MINERALS) TABS Take 1 tablet by mouth daily.     Yes Historical Provider, MD  nitroGLYCERIN (NITROSTAT) 0.4 MG SL tablet Place 1 tablet (0.4 mg total) under the tongue every 5 (five) minutes x 3 doses as needed for chest  pain. 07/12/15  Yes [name], MD  potassium chloride SA (K-DUR,KLOR-CON) 20 MEQ tablet Take 1 tablet (20 mEq total) by mouth daily. 07/26/15  Yes Dyann Kief, PA-C  promethazine (PHENERGAN) 25 MG tablet Take 25 mg by mouth every 6 (six) hours as needed for nausea.   Yes Historical Provider, MD  ranitidine (ZANTAC) 150 MG tablet Take 150 mg by mouth 2 (two) times daily.   Yes Historical Provider, MD  spironolactone (ALDACTONE) 25 MG tablet Take 1 tablet (25 mg total) by mouth daily. 05/11/15  Yes [name], MD  warfarin (COUMADIN) 10 MG tablet 10 mg daily at 6 PM.    Yes Historical Provider, MD     Family History  Problem Relation Age of Onset  . Family history unknown: Yes    Social History   Social History  . Marital Status: Single    Spouse Name: N/A  . Number of Children: N/A  . Years of Education: N/A   Occupational History  . 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 Smoking status: Former Smoker    Types: E-cigarettes    Start date: 03/03/1978    Quit date: 10/03/2013  . Smokeless tobacco: Never Used     Comment: still using e-cig - at 0.2mg  now, will go to 0 soon.    . Alcohol Use: No  . Drug Use: No     Comment: Prior history of cocaine  . Sexual Activity: Yes    Birth Control/ Protection: None   Other Topics Concern  . None   Social History Narrative    Review of Systems: A 12 point ROS discussed and pertinent positives are indicated in the HPI above.  All other systems are negative.  Review of Systems  Constitutional: Negative for fever, activity change, appetite change and fatigue.  HENT: Negative for tinnitus and trouble swallowing.   Eyes: Positive for visual disturbance.  Respiratory: Negative for shortness of breath and wheezing.   Gastrointestinal: Negative for abdominal pain.  Genitourinary: Negative for difficulty urinating.  Musculoskeletal: Negative for back pain and gait problem.  Neurological: Negative for  weakness.  Psychiatric/Behavioral: Negative for behavioral problems and confusion.    Vital Signs: BP 111/56 mmHg  Pulse 69  Temp(Src) 97.4 F (36.3 C)  Ht 5\' 6"  (1.676 m)  Wt 210 lb (95.255 kg)  BMI 33.91 kg/m2  SpO2 99%  Physical Exam  Constitutional: She is oriented to person, place, and time.  Eyes: EOM are normal.  Cardiovascular: Normal rate, regular rhythm and normal heart sounds.   Pulmonary/Chest: Effort normal and breath sounds normal. She has no wheezes.  Abdominal: Soft. Bowel sounds are normal.  Musculoskeletal: Normal range of motion.  Neurological: She is alert and oriented to person, place, and time.  Skin: Skin is warm and dry.  Psychiatric: She has  a normal mood and affect. Her behavior is normal. Judgment and thought content normal.  Nursing note and vitals reviewed.   Mallampati Score:  MD Evaluation Airway: WNL Heart: WNL Abdomen: WNL Chest/ Lungs: WNL ASA  Classification: 2 Mallampati/Airway Score: Two  Imaging: Ir Radiologist Eval & Mgmt  10/10/2015  EXAM: NEW PATIENT OFFICE VISIT CHIEF COMPLAINT: New onset of left-sided numbness and tingling with intermittent weakness. Current Pain Level: 1-10 HISTORY OF PRESENT ILLNESS: The patient is a 54 year old, right handed lady who has been referred for evaluation of new onset symptoms of left-sided paresthesias numbness and tingling with intermittent worsening of her weakness related to her previous stroke. The weakness on the left side involves both her arm and leg and has apparently been getting worse over the past 6 months. There have been no associated left-sided facial numbness or tingling or weakness or drooping. She denies any symptoms of visual aberrations, diplopia or amaurosis fugax, or of visual field defect. No history of loss of consciousness, or seizures. She denies any associated speech difficulties. She also complains of what she describes as vertigo and dizziness on looking up which is  transitory. Past Medical History: Cocaine abuse in remission. History of pneumonia. COPD. Thoracic vertebral body fracture. Heart disease. High blood pressure. History of antiphospholipid syndrome. History of right carotid occlusion with right-sided stroke. Surgical History: Polyp of her larynx. Implantable cardioverter defibrillator. Back surgery. Appendectomy. Medications: Norco. Promethazine. Methocarbamol. Citalopram. Zantac. Clonazepam. Warfarin. Aldactone. Colace. Flonase. Imdur. Levaquin. Multivitamins. Carvedilol. Hydrocodone. Cyclobenzaprine. Furosemide. Gabapentin. Isosorbide mononitrate. Nitrostat. Aspirin 81 mg. Potassium chloride. Allergies: Metoprolol. Statins. Reglan. Penicillins. Metoclopramide. Social History: Single with two siblings. Denies drinking alcohol or smoking cigarettes. Does admit to vaporing. Denies using any recreational drugs at this time. Family History: Asthma. Headaches. Heart problems. High blood pressure. Stroke. REVIEW OF SYSTEMS: Review of systems is essentially as above. PHYSICAL EXAMINATION: In no acute distress. Affect normal. Neurologic examination, alert awake oriented to time, place, space. No lateralizing cranial nerve abnormalities, motor or sensory aberrations noted. ASSESSMENT AND PLAN: Patient's recent ultrasound of the neck was reviewed. Given that the patient is having worsening of her left-sided paresthesias and numbness and intermittent weakness, and given the fact that she has a history of right carotid occlusion, a formal catheter angiogram to more accurately evaluate the intracranial circulation is indicated. The procedure, the risks, the benefits and the alternatives were all reviewed with the patient. The patient is agreeable to undergo the diagnostic catheter angiograms. To do this however, the patient will have to come off her Coumadin and be bridged with subcutaneous Lovenox as she has done in the past. This will be coordinated with Dr. Diona Browner in [place]  through his nurse Misty Stanley once the exact date has been fixed regarding the diagnostic angiogram. The patient is agreeable to this. This will be scheduled the earliest possible. Electronically Signed   By: Julieanne Cotton M.D.   On: 10/09/2015 12:49    Labs:  CBC:  Recent Labs  03/01/15 2020 11/03/15 0706  WBC 9.2 5.9  HGB 12.6 12.8  HCT 39.3 40.3  PLT 105* 146*    COAGS:  Recent Labs  09/14/15 1047 10/05/15 1026 10/17/15 1311 11/03/15 0706  INR 3.4 1.8 2.8 1.14    BMP:  Recent Labs  03/01/15 2020 11/03/15 0706  NA 137 140  K 4.6 4.9  CL 96 100*  CO2 30 29  GLUCOSE 77 81  BUN 9 13  CALCIUM 8.8 9.4  CREATININE 0.64 0.91  GFRNONAA >  90 >60  GFRAA >90 >60    LIVER FUNCTION TESTS: No results for input(s): BILITOT, AST, ALT, ALKPHOS, PROT, ALBUMIN in the last 8760 hours.  TUMOR MARKERS: No results for input(s): AFPTM, CEA, CA199, CHROMGRNA in the last 8760 hours.  Assessment and Plan:  Hx CVA 2007 ICD implant 2008 Known R ICA occlusion per doppler Now with neurologic sxs of numbness/tingling L sided; headaches Scheduled for cerebral arteriogram Risks and Benefits discussed with the patient including, but not limited to bleeding, infection, vascular injury, contrast induced renal failure, stroke or even death. All of the patient's questions were answered, patient is agreeable to proceed. Consent signed and in chart.  Thank you for this interesting consult.  I greatly enjoyed meeting [name] [name] and look forward to participating in their care.  A copy of this report was sent to the requesting provider on this date.  Signed: Wah Sabic A 11/03/2015, 8:16 AM   I spent a total of  30 Minutes   in face to face in clinical consultation, greater than 50% of which was counseling/coordinating care for cerebral arteriogram

## 2015-11-07 ENCOUNTER — Other Ambulatory Visit (HOSPITAL_COMMUNITY): Payer: Self-pay | Admitting: Interventional Radiology

## 2015-11-07 ENCOUNTER — Ambulatory Visit (INDEPENDENT_AMBULATORY_CARE_PROVIDER_SITE_OTHER): Payer: Medicaid Other | Admitting: *Deleted

## 2015-11-07 DIAGNOSIS — I679 Cerebrovascular disease, unspecified: Secondary | ICD-10-CM

## 2015-11-07 DIAGNOSIS — I729 Aneurysm of unspecified site: Secondary | ICD-10-CM

## 2015-11-07 DIAGNOSIS — Z5181 Encounter for therapeutic drug level monitoring: Secondary | ICD-10-CM

## 2015-11-07 LAB — POCT INR: INR: 2.7

## 2015-11-14 ENCOUNTER — Other Ambulatory Visit: Payer: Self-pay | Admitting: Cardiology

## 2015-11-17 ENCOUNTER — Ambulatory Visit (HOSPITAL_COMMUNITY): Payer: Medicaid Other

## 2015-11-20 ENCOUNTER — Ambulatory Visit (HOSPITAL_COMMUNITY)
Admission: RE | Admit: 2015-11-20 | Discharge: 2015-11-20 | Disposition: A | Discharge status: Home / Self Care | Payer: Medicaid Other | Source: Ambulatory Visit | Attending: Interventional Radiology | Admitting: Interventional Radiology

## 2015-11-20 DIAGNOSIS — I729 Aneurysm of unspecified site: Secondary | ICD-10-CM

## 2015-11-21 ENCOUNTER — Ambulatory Visit (INDEPENDENT_AMBULATORY_CARE_PROVIDER_SITE_OTHER): Payer: Medicaid Other | Admitting: *Deleted

## 2015-11-21 DIAGNOSIS — I679 Cerebrovascular disease, unspecified: Secondary | ICD-10-CM

## 2015-11-21 DIAGNOSIS — Z5181 Encounter for therapeutic drug level monitoring: Secondary | ICD-10-CM | POA: Diagnosis not present

## 2015-11-21 LAB — POCT INR: INR: 2.6

## 2015-12-07 ENCOUNTER — Ambulatory Visit (INDEPENDENT_AMBULATORY_CARE_PROVIDER_SITE_OTHER): Payer: Medicaid Other | Admitting: *Deleted

## 2015-12-07 DIAGNOSIS — Z5181 Encounter for therapeutic drug level monitoring: Secondary | ICD-10-CM | POA: Diagnosis not present

## 2015-12-07 DIAGNOSIS — I679 Cerebrovascular disease, unspecified: Secondary | ICD-10-CM

## 2015-12-07 LAB — POCT INR: INR: 2.2

## 2016-01-04 ENCOUNTER — Ambulatory Visit (INDEPENDENT_AMBULATORY_CARE_PROVIDER_SITE_OTHER): Payer: Medicaid Other | Admitting: *Deleted

## 2016-01-04 DIAGNOSIS — I679 Cerebrovascular disease, unspecified: Secondary | ICD-10-CM

## 2016-01-04 DIAGNOSIS — Z5181 Encounter for therapeutic drug level monitoring: Secondary | ICD-10-CM | POA: Diagnosis not present

## 2016-01-04 LAB — POCT INR: INR: 3

## 2016-01-15 ENCOUNTER — Ambulatory Visit (INDEPENDENT_AMBULATORY_CARE_PROVIDER_SITE_OTHER): Payer: Medicaid Other | Admitting: *Deleted

## 2016-01-15 ENCOUNTER — Telehealth: Payer: Self-pay | Admitting: Cardiology

## 2016-01-15 DIAGNOSIS — I255 Ischemic cardiomyopathy: Secondary | ICD-10-CM | POA: Diagnosis not present

## 2016-01-15 NOTE — Telephone Encounter (Signed)
LMOVM for pt to return call 

## 2016-01-15 NOTE — Telephone Encounter (Signed)
Spoke w/ pt and informed her that her device has reached ERI. Informed pt that she will need to schedule a appt w/ MD to talk about the procedure and then the procedure will be scheduled for another day. Pt verbalized understanding and she prefers to be seen in the [place] office.

## 2016-01-16 ENCOUNTER — Telehealth: Payer: Self-pay | Admitting: Cardiology

## 2016-01-16 NOTE — Telephone Encounter (Signed)
Spoke with pt.  She started a 10 day course of Levaquin yesterday.  Her INR was on the high side of range on 1/5.  She has an appt with Dr. Diona Browner on 1/19.  Will check her INR at that time.  In the interim, she will increase her green vegetables.

## 2016-01-16 NOTE — Progress Notes (Signed)
Remote ICD transmission.   

## 2016-01-16 NOTE — Telephone Encounter (Signed)
Started Levaquin (antiboitic) ???   Was told to call us by her PCP

## 2016-01-17 ENCOUNTER — Other Ambulatory Visit: Payer: Self-pay | Admitting: Cardiology

## 2016-01-18 ENCOUNTER — Ambulatory Visit (INDEPENDENT_AMBULATORY_CARE_PROVIDER_SITE_OTHER): Payer: Medicaid Other | Admitting: Cardiology

## 2016-01-18 ENCOUNTER — Ambulatory Visit (INDEPENDENT_AMBULATORY_CARE_PROVIDER_SITE_OTHER): Payer: Medicaid Other | Admitting: Pharmacist

## 2016-01-18 ENCOUNTER — Encounter: Payer: Self-pay | Admitting: Cardiology

## 2016-01-18 VITALS — BP 92/75 | HR 76 | Ht 66.0 in | Wt 219.2 lb

## 2016-01-18 DIAGNOSIS — Z5181 Encounter for therapeutic drug level monitoring: Secondary | ICD-10-CM

## 2016-01-18 DIAGNOSIS — I2510 Atherosclerotic heart disease of native coronary artery without angina pectoris: Secondary | ICD-10-CM

## 2016-01-18 DIAGNOSIS — D6859 Other primary thrombophilia: Secondary | ICD-10-CM | POA: Diagnosis not present

## 2016-01-18 DIAGNOSIS — I255 Ischemic cardiomyopathy: Secondary | ICD-10-CM

## 2016-01-18 DIAGNOSIS — I5022 Chronic systolic (congestive) heart failure: Secondary | ICD-10-CM

## 2016-01-18 DIAGNOSIS — I679 Cerebrovascular disease, unspecified: Secondary | ICD-10-CM

## 2016-01-18 DIAGNOSIS — E785 Hyperlipidemia, unspecified: Secondary | ICD-10-CM

## 2016-01-18 LAB — POCT INR: INR: 2.2

## 2016-01-18 NOTE — Progress Notes (Signed)
Cardiology Office Note  Date: 01/18/2016   ID: [name] [name], DOB 02/01/1961, MRN [id number]  PCP: Fredirick Maudlin, MD  Primary Cardiologist: Nona Dell, MD   Chief Complaint  Patient presents with  . Cardiomyopathy    History of Present Illness: [name] [name] is a medically complex 54 y.o. female last seen in October 2016. She presents for a routine follow-up visit. She states that she is currently getting over a bout with bronchitis, treated with antibiotics by Dr. [name]. She has had hoarseness, cough and chest congestion, also fatigue and malaise. Her weight is up 9 pounds, she attributes this to poor diet and decreased activity.  We discussed her medications which are outlined below. Cardiac regimen includes aspirin, Coumadin, Coreg, Lipitor, Lasix, Aldactone, Imdur, and Cozaar. Blood pressure limits further up titration. She has not had palpitations or syncope, no device discharges.  She continues on Coumadin with follow-up in the anticoagulation clinic. No reported spontaneous bleeding problems.  She did not follow-up for lab work since November. Last assessment of creatinine and potassium was normal.  Her last echocardiogram was done in [place] back in April 2016. We discussed obtaining a follow-up study for her next visit.  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 Corliss Skains. She tells me that she has been referred to [facility].  Past Medical History  Diagnosis Date  . History of pneumonia   . Chronic systolic heart failure (HCC)   . 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 Coronary atherosclerosis of native coronary artery     Severe two vessel disease   . Cocaine abuse in remission   . Raynaud's phenomenon   . Essential hypertension, benign   . Mixed hyperlipidemia   . Anticardiolipin antibody positive     Chronic Coumadin  . PVD (peripheral vascular  disease) (HCC)   . Ischemic cardiomyopathy     LVEF 30-35%  . Headache     Past Surgical History  Procedure Laterality Date  . Appendectomy    . Laryngeal polyp excision    . L1 corpectomy   12/10  . Cardiac defibrillator placement      St.Jude ICD  . Abdominal aortagram N/A 05/25/2014    Procedure: ABDOMINAL Ronny Flurry;  Surgeon: Iran Ouch, MD;  Location: [facility] CATH LAB;  Service: Cardiovascular;  Laterality: N/A;    Current Outpatient Prescriptions  Medication Sig Dispense Refill  . aspirin EC 81 MG tablet Take 81 mg by mouth daily.    Marland Kitchen atorvastatin (LIPITOR) 10 MG tablet Take 10 mg by mouth daily.    . B Complex-Folic Acid (B COMPLEX-VITAMIN B12 PO) Take 1 tablet by mouth daily.     . carvedilol (COREG) 12.5 MG tablet TAKE 1 TABLET BY MOUTH TWICE DAILY WITH MEALS 90 tablet 2  . cetirizine (ZYRTEC) 10 MG tablet Take 10 mg by mouth 2 (two) times daily.     . citalopram (CELEXA) 20 MG tablet Take 20 mg by mouth daily.     Marland Kitchen docusate sodium (COLACE) 100 MG capsule Take 100 mg by mouth at bedtime.    . Folic Acid-B6-B12-Coenzyme W09 (RX ESSENTIALS CHOLESTEROL PO) Take 1 tablet by mouth every morning. [employer] RX OTC from [facility]    . furosemide (LASIX) 80 MG tablet Take 1 tablet (80 mg total) by mouth daily. 90 tablet 3  . gabapentin (NEURONTIN) 300 MG  capsule Take 300 mg by mouth 2 (two) times daily.    Marland Kitchen guaiFENesin (MUCINEX) 600 MG 12 hr tablet Take 600 mg by mouth 2 (two) times daily.    Marland Kitchen HYDROcodone-acetaminophen (NORCO) 10-325 MG per tablet Take 0.5 tablets by mouth 4 (four) times daily as needed for moderate pain or severe pain. pain    . ibuprofen (ADVIL,MOTRIN) 200 MG tablet Take 400 mg by mouth every 6 (six) hours as needed (PAIN). pain    . isosorbide mononitrate (IMDUR) 30 MG 24 hr tablet TAKE 1 TABLET BY MOUTH DAILY 90 tablet 3  . levofloxacin (LEVAQUIN) 500 MG tablet Take 500 mg by mouth daily.    Marland Kitchen losartan (COZAAR) 25 MG tablet Take 0.5 tablets (12.5 mg  total) by mouth daily. 45 tablet 3  . methocarbamol (ROBAXIN) 500 MG tablet Take 500 mg by mouth as needed for muscle spasms.    . Multiple Vitamins-Minerals (MULTIVITAMINS THER. W/MINERALS) TABS Take 1 tablet by mouth daily.      . nitroGLYCERIN (NITROSTAT) 0.4 MG SL tablet Place 1 tablet (0.4 mg total) under the tongue every 5 (five) minutes x 3 doses as needed for chest pain. 25 tablet 3  . potassium chloride SA (K-DUR,KLOR-CON) 20 MEQ tablet Take 1 tablet (20 mEq total) by mouth daily. 90 tablet 3  . promethazine (PHENERGAN) 25 MG tablet Take 25 mg by mouth every 6 (six) hours as needed for nausea.    . ranitidine (ZANTAC) 150 MG tablet Take 150 mg by mouth 2 (two) times daily.    Marland Kitchen spironolactone (ALDACTONE) 25 MG tablet Take 1 tablet (25 mg total) by mouth daily. 90 tablet 3  . warfarin (COUMADIN) 10 MG tablet 10 mg daily at 6 PM.      No current facility-administered medications for this visit.   Allergies:  Penicillins; Metoclopramide hcl; Metoprolol; and Statins   Social History: The patient  reports that she quit smoking about 2 years ago. Her smoking use included E-cigarettes. She started smoking about 37 years ago. She has never used smokeless tobacco. She reports that she does not drink alcohol or use illicit drugs.   ROS:  Please see the history of present illness. Otherwise, complete review of systems is positive for improving hoarseness and cough.  All other systems are reviewed and negative.   Physical Exam: VS:  BP 92/75 mmHg  Pulse 76  Ht 5\' 6"  (1.676 m)  Wt 219 lb 3.2 oz (99.428 kg)  BMI 35.40 kg/m2  SpO2 96%, BMI Body mass index is 35.4 kg/(m^2).  Wt Readings from Last 3 Encounters:  01/18/16 219 lb 3.2 oz (99.428 kg)  11/03/15 210 lb (95.255 kg)  10/17/15 207 lb 6.4 oz (94.076 kg)    General: Overweight, chronically ill-appearing woman in no distress. HEENT: Conjunctiva and lids normal, oropharynx clear. Neck: Supple, no elevated JVP, no thyromegaly. Lungs:  Clear to auscultation decreased at the bases, nonlabored breathing at rest. Cardiac: Indistinct PMI, regular rate and rhythm, no S3, no pericardial rub. Abdomen: Soft but protuberant, nontender, bowel sounds present, no guarding or rebound. Extremities: 1+ edema, distal pulses 2+. Musculoskeletal: No kyphosis. Skin: Warm and dry. Neuropsychiatric: Alert and oriented 3, affect appropriate.  ECG: ECG is not ordered today.  Recent Labwork: 03/01/2015: B Natriuretic Peptide 141.0* 11/03/2015: BUN 13; Creatinine, Ser 0.91; Hemoglobin 12.8; Platelets 146*; Potassium 4.9; Sodium 140   Other Studies Reviewed Today:  Workup in April 2016 at [facility] in [place] included a myocardial perfusion imaging which showed  no active ischemia with fixed defects in the anterolateral and apical regions, LVEF 16%. Echocardiogram reported LVEF of approximately 20% with inferior, posterior, and apical akinesis, mild LVH with mild chamber dilatation, normal RV contraction, moderate left atrial enlargement, device wire present in the right heart, moderate mitral regurgitation, mild tricuspid regurgitation, RVSP not calculated.  Assessment and Plan:  1. Ischemic cardiomyopathy with LVEF approximately 20%. Plan at this time is to continue current medical regimen, follow-up BMET, and then plan for a repeat echocardiogram prior to her next clinical visit. I have talked with her about possible referral to the CHF clinic, and she is considering this.  2. Two-vessel CAD, no active angina symptoms. Cardiolite done in April 2016 revealed scar within the anterolateral and apical region, no active ischemia.  3. Hypercoagulable state with history of carotid occlusion and previous stroke. She continues on long-term Coumadin and aspirin.  4. Hyperlipidemia, on statin therapy.  Current medicines were reviewed with the patient today.   Orders Placed This Encounter  Procedures  . ECHOCARDIOGRAM COMPLETE     Disposition: FU with me in 2 months.   Signed, [name], MD, [facility] 01/18/2016 2:28 PM    [facility] at [facility] [address] [place], [place], [place] [address] Phone: [phone]; Fax: [phone]

## 2016-01-18 NOTE — Patient Instructions (Signed)
Your physician recommends that you continue on your current medications as directed. Please refer to the Current Medication list given to you today. Your physician recommends that you return for lab work today to check your BMET. Your physician has requested that you have an echocardiogram in 2 months just before your next visit. Echocardiography is a painless test that uses sound waves to create images of your heart. It provides your doctor with information about the size and shape of your heart and how well your heart's chambers and valves are working. This procedure takes approximately one hour. There are no restrictions for this procedure. Your physician recommends that you schedule a follow-up appointment in: 2 months.

## 2016-01-19 ENCOUNTER — Encounter: Payer: Self-pay | Admitting: Internal Medicine

## 2016-01-19 LAB — CUP PACEART REMOTE DEVICE CHECK
Battery Voltage: 2.6 V
Brady Statistic RA Percent Paced: 0 %
Brady Statistic RV Percent Paced: 0 %
Date Time Interrogation Session: 20170120163741
HighPow Impedance: 48 Ohm
Implantable Lead Implant Date: 20081119
Implantable Lead Implant Date: 20081119
Implantable Lead Location: 753859
Implantable Lead Location: 753860
Implantable Lead Model: 185
Implantable Lead Model: 4136
Implantable Lead Serial Number: 211124
Implantable Lead Serial Number: 28383796
Lead Channel Impedance Value: 547 Ohm
Lead Channel Impedance Value: 561 Ohm
Lead Channel Sensing Intrinsic Amplitude: 2.4 mV
Lead Channel Sensing Intrinsic Amplitude: 21.6 mV
Lead Channel Setting Pacing Amplitude: 2 V
Lead Channel Setting Pacing Amplitude: 2.4 V
Lead Channel Setting Pacing Pulse Width: 0.5 ms
Pulse Gen Serial Number: 504498

## 2016-02-07 ENCOUNTER — Telehealth: Payer: Self-pay | Admitting: *Deleted

## 2016-02-07 NOTE — Telephone Encounter (Signed)
Patient informed. 

## 2016-02-07 NOTE — Telephone Encounter (Signed)
-----   Message from [name], MD sent at 02/05/2016 12:48 PM EST ----- Reviewed results. Renal function and potassium are normal. Continue current regimen.

## 2016-02-08 ENCOUNTER — Ambulatory Visit (INDEPENDENT_AMBULATORY_CARE_PROVIDER_SITE_OTHER): Payer: Medicaid Other | Admitting: *Deleted

## 2016-02-08 DIAGNOSIS — I679 Cerebrovascular disease, unspecified: Secondary | ICD-10-CM

## 2016-02-08 DIAGNOSIS — Z5181 Encounter for therapeutic drug level monitoring: Secondary | ICD-10-CM | POA: Diagnosis not present

## 2016-02-08 LAB — POCT INR: INR: 2.4

## 2016-02-15 ENCOUNTER — Encounter: Payer: Self-pay | Admitting: Internal Medicine

## 2016-02-15 ENCOUNTER — Ambulatory Visit (INDEPENDENT_AMBULATORY_CARE_PROVIDER_SITE_OTHER): Payer: Medicaid Other | Admitting: Internal Medicine

## 2016-02-15 VITALS — BP 112/68 | HR 74 | Ht 66.0 in | Wt 228.0 lb

## 2016-02-15 DIAGNOSIS — I5022 Chronic systolic (congestive) heart failure: Secondary | ICD-10-CM

## 2016-02-15 DIAGNOSIS — I255 Ischemic cardiomyopathy: Secondary | ICD-10-CM | POA: Diagnosis not present

## 2016-02-15 DIAGNOSIS — Z4502 Encounter for adjustment and management of automatic implantable cardiac defibrillator: Secondary | ICD-10-CM | POA: Diagnosis not present

## 2016-02-15 DIAGNOSIS — Z95810 Presence of automatic (implantable) cardiac defibrillator: Secondary | ICD-10-CM

## 2016-02-15 LAB — BASIC METABOLIC PANEL
BUN: 16 mg/dL (ref 7–25)
CO2: 28 mmol/L (ref 20–31)
Calcium: 9.2 mg/dL (ref 8.6–10.4)
Chloride: 98 mmol/L (ref 98–110)
Creat: 0.78 mg/dL (ref 0.50–1.05)
Glucose, Bld: 83 mg/dL (ref 65–99)
Potassium: 4 mmol/L (ref 3.5–5.3)
Sodium: 136 mmol/L (ref 135–146)

## 2016-02-15 LAB — CUP PACEART INCLINIC DEVICE CHECK
Date Time Interrogation Session: 20170216160026
Implantable Lead Implant Date: 20081119
Implantable Lead Implant Date: 20081119
Implantable Lead Location: 753859
Implantable Lead Location: 753860
Implantable Lead Model: 185
Implantable Lead Model: 4136
Implantable Lead Serial Number: 211124
Implantable Lead Serial Number: 28383796
Pulse Gen Serial Number: 504498

## 2016-02-15 LAB — CBC WITH DIFFERENTIAL/PLATELET
Basophils Absolute: 0.1 10*3/uL (ref 0.0–0.1)
Basophils Relative: 1 % (ref 0–1)
Eosinophils Absolute: 0.3 10*3/uL (ref 0.0–0.7)
Eosinophils Relative: 3 % (ref 0–5)
HCT: 43.6 % (ref 36.0–46.0)
Hemoglobin: 14.3 g/dL (ref 12.0–15.0)
Lymphocytes Relative: 37 % (ref 12–46)
Lymphs Abs: 3.1 10*3/uL (ref 0.7–4.0)
MCH: 29.5 pg (ref 26.0–34.0)
MCHC: 32.8 g/dL (ref 30.0–36.0)
MCV: 89.9 fL (ref 78.0–100.0)
MPV: 9.2 fL (ref 8.6–12.4)
Monocytes Absolute: 0.8 10*3/uL (ref 0.1–1.0)
Monocytes Relative: 9 % (ref 3–12)
Neutro Abs: 4.2 10*3/uL (ref 1.7–7.7)
Neutrophils Relative %: 50 % (ref 43–77)
Platelets: 231 10*3/uL (ref 150–400)
RBC: 4.85 MIL/uL (ref 3.87–5.11)
RDW: 13.1 % (ref 11.5–15.5)
WBC: 8.4 10*3/uL (ref 4.0–10.5)

## 2016-02-15 NOTE — Patient Instructions (Signed)
Medication Instructions:  Your physician recommends that you continue on your current medications as directed. Please refer to the Current Medication list given to you today.   Labwork: Your physician recommends that you return for lab work today: BMP/CBC/INR   Testing/Procedures: Generator change on 03/01/16.    Please check in at the [facility] Entrance of [facility] at 5:30am.  Do Not eat or drink after midnight the night before procedure.  Do not take any medications the morning of of the procedure and HOLD your Coumadin for 2 days prior.    Follow-Up: Your physician recommends that you schedule a follow-up appointment in: 10-14 days from 03/01/16 ion the device clinic for a wound check and 3 months from 03/01/16 with Dr Ladona Ridgel   Any Other Special Instructions Will Be Listed Below (If Applicable).     If you need a refill on your cardiac medications before your next appointment, please call your pharmacy.

## 2016-02-15 NOTE — Progress Notes (Signed)
HPI [name] [name] returns today for followup. She is a very pleasant 55 year old woman with an ischemic cardiomyopathy, chronic systolic heart failure, well compensated, VT, status post ICD implantation. The patient has done well in the interim. She has had no ICD shocks. She denies peripheral edema. She has chronic claudication. She notes that her device slides back and forth when she turns over.  Allergies  Allergen Reactions  . Penicillins Anaphylaxis      shock  . Metoclopramide Hcl     Non responsive  . Metoprolol Other (See Comments)    Syncope   . Statins Other (See Comments)    Myalgias     Current Outpatient Prescriptions  Medication Sig Dispense Refill  . aspirin EC 81 MG tablet Take 81 mg by mouth daily.    Marland Kitchen atorvastatin (LIPITOR) 10 MG tablet Take 10 mg by mouth daily.    . B Complex-Folic Acid (B COMPLEX-VITAMIN B12 PO) Take 1 tablet by mouth daily.     . carvedilol (COREG) 12.5 MG tablet TAKE 1 TABLET BY MOUTH TWICE DAILY WITH MEALS 90 tablet 2  . cetirizine (ZYRTEC) 10 MG tablet Take 10 mg by mouth daily.     . citalopram (CELEXA) 20 MG tablet Take 40 mg by mouth daily.     Marland Kitchen docusate sodium (COLACE) 100 MG capsule Take 100 mg by mouth at bedtime.    . Folic Acid-B6-B12-Coenzyme L79 (RX ESSENTIALS CHOLESTEROL PO) Take 1 tablet by mouth every morning. [facility] OTC from [facility]    . furosemide (LASIX) 80 MG tablet Take 1 tablet (80 mg total) by mouth daily. 90 tablet 3  . gabapentin (NEURONTIN) 300 MG capsule Take 300 mg by mouth 2 (two) times daily.    Marland Kitchen guaiFENesin (MUCINEX) 600 MG 12 hr tablet Take 600 mg by mouth 2 (two) times daily.    Marland Kitchen HYDROcodone-acetaminophen (NORCO) 10-325 MG per tablet Take 0.5 tablets by mouth 4 (four) times daily as needed for moderate pain or severe pain. pain    . ibuprofen (ADVIL,MOTRIN) 200 MG tablet Take 400 mg by mouth every 6 (six) hours as needed (PAIN). pain    . isosorbide mononitrate (IMDUR) 30 MG 24 hr tablet TAKE 1  TABLET BY MOUTH DAILY 90 tablet 3  . levofloxacin (LEVAQUIN) 500 MG tablet Take 500 mg by mouth daily.    Marland Kitchen losartan (COZAAR) 25 MG tablet Take 0.5 tablets (12.5 mg total) by mouth daily. 45 tablet 3  . methocarbamol (ROBAXIN) 500 MG tablet Take 500 mg by mouth as needed for muscle spasms.    . Multiple Vitamins-Minerals (MULTIVITAMINS THER. W/MINERALS) TABS Take 1 tablet by mouth daily.      . nitroGLYCERIN (NITROSTAT) 0.4 MG SL tablet Place 1 tablet (0.4 mg total) under the tongue every 5 (five) minutes x 3 doses as needed for chest pain. 25 tablet 3  . potassium chloride SA (K-DUR,KLOR-CON) 20 MEQ tablet Take 1 tablet (20 mEq total) by mouth daily. 90 tablet 3  . promethazine (PHENERGAN) 25 MG tablet Take 25 mg by mouth every 6 (six) hours as needed for nausea.    . ranitidine (ZANTAC) 150 MG tablet Take 150 mg by mouth 2 (two) times daily.    Marland Kitchen spironolactone (ALDACTONE) 25 MG tablet Take 1 tablet (25 mg total) by mouth daily. 90 tablet 3  . warfarin (COUMADIN) 10 MG tablet 10 mg daily at 6 PM.      No current facility-administered medications for this visit.  Past Medical History  Diagnosis Date  . History of pneumonia   . Chronic systolic heart failure (HCC)   . 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 Coronary atherosclerosis of native coronary artery     Severe two vessel disease   . Cocaine abuse in remission   . Raynaud's phenomenon   . Essential hypertension, benign   . Mixed hyperlipidemia   . Anticardiolipin antibody positive     Chronic Coumadin  . PVD (peripheral vascular disease) (HCC)   . Ischemic cardiomyopathy     LVEF 30-35%  . Headache     ROS:   All systems reviewed and negative except as noted in the HPI.   Past Surgical History  Procedure Laterality Date  . Appendectomy    . Laryngeal polyp excision    . L1 corpectomy   12/10  . Cardiac defibrillator placement      [employer] ICD  . Abdominal aortagram N/A 05/25/2014     Procedure: ABDOMINAL Ronny Flurry;  Surgeon: Iran Ouch, MD;  Location: [facility] CATH LAB;  Service: Cardiovascular;  Laterality: N/A;     Family History  Problem Relation Age of Onset  . Family history unknown: Yes     Social History   Social History  . Marital Status: Single    Spouse Name: N/A  . Number of Children: N/A  . Years of Education: N/A   Occupational History  . 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 Smoking status: Former Smoker    Types: E-cigarettes    Start date: 03/03/1978    Quit date: 10/03/2013  . Smokeless tobacco: Never Used     Comment: still using e-cig - at 0.2mg  now, will go to 0 soon.    . Alcohol Use: No  . Drug Use: No     Comment: Prior history of cocaine  . Sexual Activity: Yes    Birth Control/ Protection: None   Other Topics Concern  . Not on file   Social History Narrative     BP 112/68 mmHg  Pulse 74  Ht  (1.676 m)  Wt 228 lb (103.42 kg)  BMI 36.82 kg/m2  Physical Exam:  Well appearing middle-aged woman, NAD HEENT: Unremarkable Neck:  7 cm JVD, no thyromegally Lungs:  Clear with no wheezes, rales, or rhonchi. Well-healed ICD incision. HEART:  Regular rate rhythm, no murmurs, no rubs, no clicks Abd:  soft, positive bowel sounds, no organomegally, no rebound, no guarding Ext:  2 plus pulses, no edema, no cyanosis, no clubbing Skin:  No rashes no nodules Neuro:  CN II through XII intact, motor grossly intact  DEVICE  Normal device function.  See PaceArt for details. Device at [employer].  Assess/Plan: 1. VT - she is maintaining NSR with no additional ICD therapies since her last visit. 2. Chronic systolic heart failure - she will continue her current meds. She has tried to keep her salt intake reduced 3. ICD - her boston Sci device is at [employer]. We will schedule ICD gen change and I would anticipate a subpectoral implant as she has c/o the device bothering her when she turns over. 4. Obesity - we discussed  the importance of weight loss. She states that once her device has been placed, she plans to start exercising.   Leonia Reeves.D.

## 2016-02-16 LAB — PROTIME-INR
INR: 3.04 — ABNORMAL HIGH (ref <1.50)
Prothrombin Time: 31.9 seconds — ABNORMAL HIGH (ref 11.6–15.2)

## 2016-02-29 ENCOUNTER — Other Ambulatory Visit: Payer: Self-pay | Admitting: Physician Assistant

## 2016-03-01 ENCOUNTER — Encounter (HOSPITAL_COMMUNITY)
Admission: RE | Disposition: A | Discharge status: Home / Self Care | Payer: Self-pay | Source: Ambulatory Visit | Attending: Internal Medicine

## 2016-03-01 ENCOUNTER — Encounter (HOSPITAL_COMMUNITY): Payer: Self-pay | Admitting: Internal Medicine

## 2016-03-01 ENCOUNTER — Ambulatory Visit (HOSPITAL_COMMUNITY)
Admission: RE | Admit: 2016-03-01 | Discharge: 2016-03-01 | Disposition: A | Discharge status: Home / Self Care | Payer: Medicaid Other | Source: Ambulatory Visit | Attending: Internal Medicine | Admitting: Internal Medicine

## 2016-03-01 DIAGNOSIS — I255 Ischemic cardiomyopathy: Secondary | ICD-10-CM | POA: Diagnosis not present

## 2016-03-01 DIAGNOSIS — Z880 Allergy status to penicillin: Secondary | ICD-10-CM | POA: Insufficient documentation

## 2016-03-01 DIAGNOSIS — Z4502 Encounter for adjustment and management of automatic implantable cardiac defibrillator: Secondary | ICD-10-CM | POA: Diagnosis not present

## 2016-03-01 DIAGNOSIS — Z6836 Body mass index (BMI) 36.0-36.9, adult: Secondary | ICD-10-CM | POA: Diagnosis not present

## 2016-03-01 DIAGNOSIS — E782 Mixed hyperlipidemia: Secondary | ICD-10-CM | POA: Insufficient documentation

## 2016-03-01 DIAGNOSIS — Z7901 Long term (current) use of anticoagulants: Secondary | ICD-10-CM | POA: Diagnosis not present

## 2016-03-01 DIAGNOSIS — I472 Ventricular tachycardia: Secondary | ICD-10-CM | POA: Insufficient documentation

## 2016-03-01 DIAGNOSIS — F1421 Cocaine dependence, in remission: Secondary | ICD-10-CM | POA: Insufficient documentation

## 2016-03-01 DIAGNOSIS — E669 Obesity, unspecified: Secondary | ICD-10-CM | POA: Insufficient documentation

## 2016-03-01 DIAGNOSIS — I5022 Chronic systolic (congestive) heart failure: Secondary | ICD-10-CM | POA: Diagnosis not present

## 2016-03-01 DIAGNOSIS — Z8673 Personal history of transient ischemic attack (TIA), and cerebral infarction without residual deficits: Secondary | ICD-10-CM | POA: Diagnosis not present

## 2016-03-01 DIAGNOSIS — Z87891 Personal history of nicotine dependence: Secondary | ICD-10-CM | POA: Diagnosis not present

## 2016-03-01 DIAGNOSIS — Z7982 Long term (current) use of aspirin: Secondary | ICD-10-CM | POA: Diagnosis not present

## 2016-03-01 DIAGNOSIS — I6521 Occlusion and stenosis of right carotid artery: Secondary | ICD-10-CM | POA: Insufficient documentation

## 2016-03-01 DIAGNOSIS — I2510 Atherosclerotic heart disease of native coronary artery without angina pectoris: Secondary | ICD-10-CM | POA: Insufficient documentation

## 2016-03-01 DIAGNOSIS — I739 Peripheral vascular disease, unspecified: Secondary | ICD-10-CM | POA: Insufficient documentation

## 2016-03-01 DIAGNOSIS — I7300 Raynaud's syndrome without gangrene: Secondary | ICD-10-CM | POA: Diagnosis not present

## 2016-03-01 DIAGNOSIS — I110 Hypertensive heart disease with heart failure: Secondary | ICD-10-CM | POA: Insufficient documentation

## 2016-03-01 HISTORY — PX: EP IMPLANTABLE DEVICE: SHX172B

## 2016-03-01 LAB — SURGICAL PCR SCREEN
MRSA, PCR: NEGATIVE
Staphylococcus aureus: NEGATIVE

## 2016-03-01 LAB — PROTIME-INR
INR: 1.82 — ABNORMAL HIGH (ref 0.00–1.49)
Prothrombin Time: 21 seconds — ABNORMAL HIGH (ref 11.6–15.2)

## 2016-03-01 SURGERY — ICD/BIV ICD GENERATOR CHANGEOUT

## 2016-03-01 MED ORDER — ACETAMINOPHEN 325 MG PO TABS: 325.0000 mg | ORAL_TABLET | ORAL | Status: DC | PRN

## 2016-03-01 MED ORDER — LIDOCAINE HCL (PF) 1 % IJ SOLN
INTRAMUSCULAR | Status: DC | PRN
  Administered 2016-03-01: 60 mL

## 2016-03-01 MED ORDER — FENTANYL CITRATE (PF) 100 MCG/2ML IJ SOLN
INTRAMUSCULAR | Status: DC | PRN
  Administered 2016-03-01 (×2): 25 ug via INTRAVENOUS
  Administered 2016-03-01 (×2): 12.5 ug via INTRAVENOUS
  Administered 2016-03-01: 25 ug via INTRAVENOUS
  Administered 2016-03-01: 12.5 ug via INTRAVENOUS

## 2016-03-01 MED ORDER — ONDANSETRON HCL 4 MG/2ML IJ SOLN: 4.0000 mg | Freq: Four times a day (QID) | INTRAMUSCULAR | Status: DC | PRN

## 2016-03-01 MED ORDER — MUPIROCIN 2 % EX OINT
TOPICAL_OINTMENT | CUTANEOUS | Status: AC
Start: 2016-03-01 — End: 2016-03-01
  Administered 2016-03-01: 1
  Filled 2016-03-01: qty 22

## 2016-03-01 MED ORDER — ONDANSETRON HCL 4 MG/2ML IJ SOLN
INTRAMUSCULAR | Status: AC
  Filled 2016-03-01: qty 2

## 2016-03-01 MED ORDER — MIDAZOLAM HCL 5 MG/5ML IJ SOLN
INTRAMUSCULAR | Status: DC | PRN
  Administered 2016-03-01: 1 mg via INTRAVENOUS
  Administered 2016-03-01 (×2): 2 mg via INTRAVENOUS
  Administered 2016-03-01 (×3): 1 mg via INTRAVENOUS

## 2016-03-01 MED ORDER — SODIUM CHLORIDE 0.9 % IR SOLN
80.0000 mg | Status: AC
  Administered 2016-03-01: 80 mg

## 2016-03-01 MED ORDER — MIDAZOLAM HCL 5 MG/5ML IJ SOLN
INTRAMUSCULAR | Status: AC
  Filled 2016-03-01: qty 5

## 2016-03-01 MED ORDER — SODIUM CHLORIDE 0.9 % IV SOLN
1500.0000 mg | INTRAVENOUS | Status: DC
  Administered 2016-03-01: 1500 mg via INTRAVENOUS
  Filled 2016-03-01 (×2): qty 1500

## 2016-03-01 MED ORDER — CHLORHEXIDINE GLUCONATE 4 % EX LIQD: 60.0000 mL | Freq: Once | CUTANEOUS | Status: DC

## 2016-03-01 MED ORDER — LIDOCAINE HCL (PF) 1 % IJ SOLN
INTRAMUSCULAR | Status: AC
  Filled 2016-03-01: qty 30

## 2016-03-01 MED ORDER — FENTANYL CITRATE (PF) 100 MCG/2ML IJ SOLN
INTRAMUSCULAR | Status: AC
  Filled 2016-03-01: qty 2

## 2016-03-01 MED ORDER — SODIUM CHLORIDE 0.9 % IR SOLN
Status: AC
  Filled 2016-03-01: qty 2

## 2016-03-01 MED ORDER — SODIUM CHLORIDE 0.9 % IV SOLN
INTRAVENOUS | Status: DC
  Administered 2016-03-01: 07:00:00 via INTRAVENOUS

## 2016-03-01 MED ORDER — ONDANSETRON HCL 4 MG/2ML IJ SOLN
INTRAMUSCULAR | Status: DC | PRN
Start: 2016-03-01 — End: 2016-03-01
  Administered 2016-03-01 (×2): 2 mg via INTRAVENOUS

## 2016-03-01 MED ORDER — FENTANYL CITRATE (PF) 100 MCG/2ML IJ SOLN
INTRAMUSCULAR | Status: AC
Start: 2016-03-01 — End: 2016-03-01
  Filled 2016-03-01: qty 2

## 2016-03-01 SURGICAL SUPPLY — 5 items
CABLE SURGICAL S-101-97-12 (CABLE) ×2 IMPLANT
DEFIB DYNAGEN EL DR DUAL STRL (ICD Generator) ×2 IMPLANT
DEFIBRILATOR DYNAGN DR DUL STR (ICD Generator) ×1 IMPLANT
PAD DEFIB LIFELINK (PAD) ×2 IMPLANT
TRAY PACEMAKER INSERTION (PACKS) ×2 IMPLANT

## 2016-03-01 NOTE — Interval H&P Note (Signed)
History and Physical Interval Note:  03/01/2016 7:23 AM  [name] [name]  has presented today for surgery, with the diagnosis of eri  The various methods of treatment have been discussed with the patient and family. After consideration of risks, benefits and other options for treatment, the patient has consented to  Procedure(s): ICD [employer] (N/A) as a surgical intervention .  The patient's history has been reviewed, patient examined, no change in status, stable for surgery.  I have reviewed the patient's chart and labs.  Questions were answered to the patient's satisfaction.     Leonia Reeves.D.

## 2016-03-01 NOTE — Discharge Instructions (Signed)

## 2016-03-01 NOTE — H&P (Signed)
  ICD Criteria  Current LVEF:30%. Within 12 months prior to implant: No   Heart failure history: Yes, Class II  Cardiomyopathy history: Yes, Ischemic Cardiomyopathy.  Atrial Fibrillation/Atrial Flutter: No.  Ventricular tachycardia history: Yes, Hemodynamic instability present. VT Type: Sustained Ventricular Tachycardia - Monomorphic.  Cardiac arrest history: No.  History of syndromes with risk of sudden death: No.  Previous ICD: Yes, Reason for ICD:  Secondary prevention.  Current ICD indication: Secondary  PPM indication: No.   Class I or II Bradycardia indication present: No  Beta Blocker therapy for 3 or more months: Yes, prescribed.   Ace Inhibitor/ARB therapy for 3 or more months: Yes, prescribed.

## 2016-03-01 NOTE — H&P (View-Only) (Signed)
HPI [name] [name] returns today for followup. She is a very pleasant 55 year old woman with an ischemic cardiomyopathy, chronic systolic heart failure, well compensated, VT, status post ICD implantation. The patient has done well in the interim. She has had no ICD shocks. She denies peripheral edema. She has chronic claudication. She notes that her device slides back and forth when she turns over.  Allergies  Allergen Reactions  . Penicillins Anaphylaxis      shock  . Metoclopramide Hcl     Non responsive  . Metoprolol Other (See Comments)    Syncope   . Statins Other (See Comments)    Myalgias     Current Outpatient Prescriptions  Medication Sig Dispense Refill  . aspirin EC 81 MG tablet Take 81 mg by mouth daily.    Marland Kitchen atorvastatin (LIPITOR) 10 MG tablet Take 10 mg by mouth daily.    . B Complex-Folic Acid (B COMPLEX-VITAMIN B12 PO) Take 1 tablet by mouth daily.     . carvedilol (COREG) 12.5 MG tablet TAKE 1 TABLET BY MOUTH TWICE DAILY WITH MEALS 90 tablet 2  . cetirizine (ZYRTEC) 10 MG tablet Take 10 mg by mouth daily.     . citalopram (CELEXA) 20 MG tablet Take 40 mg by mouth daily.     Marland Kitchen docusate sodium (COLACE) 100 MG capsule Take 100 mg by mouth at bedtime.    . Folic Acid-B6-B12-Coenzyme L79 (RX ESSENTIALS CHOLESTEROL PO) Take 1 tablet by mouth every morning. [employer] [facility] OTC from [facility]    . furosemide (LASIX) 80 MG tablet Take 1 tablet (80 mg total) by mouth daily. 90 tablet 3  . gabapentin (NEURONTIN) 300 MG capsule Take 300 mg by mouth 2 (two) times daily.    Marland Kitchen guaiFENesin (MUCINEX) 600 MG 12 hr tablet Take 600 mg by mouth 2 (two) times daily.    Marland Kitchen HYDROcodone-acetaminophen (NORCO) 10-325 MG per tablet Take 0.5 tablets by mouth 4 (four) times daily as needed for moderate pain or severe pain. pain    . ibuprofen (ADVIL,MOTRIN) 200 MG tablet Take 400 mg by mouth every 6 (six) hours as needed (PAIN). pain    . isosorbide mononitrate (IMDUR) 30 MG 24 hr tablet TAKE 1  TABLET BY MOUTH DAILY 90 tablet 3  . levofloxacin (LEVAQUIN) 500 MG tablet Take 500 mg by mouth daily.    Marland Kitchen losartan (COZAAR) 25 MG tablet Take 0.5 tablets (12.5 mg total) by mouth daily. 45 tablet 3  . methocarbamol (ROBAXIN) 500 MG tablet Take 500 mg by mouth as needed for muscle spasms.    . Multiple Vitamins-Minerals (MULTIVITAMINS THER. W/MINERALS) TABS Take 1 tablet by mouth daily.      . nitroGLYCERIN (NITROSTAT) 0.4 MG SL tablet Place 1 tablet (0.4 mg total) under the tongue every 5 (five) minutes x 3 doses as needed for chest pain. 25 tablet 3  . potassium chloride SA (K-DUR,KLOR-CON) 20 MEQ tablet Take 1 tablet (20 mEq total) by mouth daily. 90 tablet 3  . promethazine (PHENERGAN) 25 MG tablet Take 25 mg by mouth every 6 (six) hours as needed for nausea.    . ranitidine (ZANTAC) 150 MG tablet Take 150 mg by mouth 2 (two) times daily.    Marland Kitchen spironolactone (ALDACTONE) 25 MG tablet Take 1 tablet (25 mg total) by mouth daily. 90 tablet 3  . warfarin (COUMADIN) 10 MG tablet 10 mg daily at 6 PM.      No current facility-administered medications for this visit.  Past Medical History  Diagnosis Date  . History of pneumonia   . Chronic systolic heart failure (HCC)   . 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 Coronary atherosclerosis of native coronary artery     Severe two vessel disease   . Cocaine abuse in remission   . Raynaud's phenomenon   . Essential hypertension, benign   . Mixed hyperlipidemia   . Anticardiolipin antibody positive     Chronic Coumadin  . PVD (peripheral vascular disease) (HCC)   . Ischemic cardiomyopathy     LVEF 30-35%  . Headache     ROS:   All systems reviewed and negative except as noted in the HPI.   Past Surgical History  Procedure Laterality Date  . Appendectomy    . Laryngeal polyp excision    . L1 corpectomy   12/10  . Cardiac defibrillator placement      [employer] ICD  . Abdominal aortagram N/A 05/25/2014     Procedure: ABDOMINAL AORTAGRAM;  Surgeon: Muhammad A Arida, MD;  Location: [facility] CATH LAB;  Service: Cardiovascular;  Laterality: N/A;     Family History  Problem Relation Age of Onset  . Family history unknown: Yes     Social History   Social History  . Marital Status: Single    Spouse Name: N/A  . Number of Children: N/A  . Years of Education: N/A   Occupational History  . Part-time teacher     [employer]   Social History Main Topics  . Smoking status: Former Smoker    Types: E-cigarettes    Start date: 03/03/1978    Quit date: 10/03/2013  . Smokeless tobacco: Never Used     Comment: still using e-cig - at 0.2mg now, will go to 0 soon.    . Alcohol Use: No  . Drug Use: No     Comment: Prior history of cocaine  . Sexual Activity: Yes    Birth Control/ Protection: None   Other Topics Concern  . Not on file   Social History Narrative     BP 112/68 mmHg  Pulse 74  Ht 5' 6" (1.676 m)  Wt 228 lb (103.42 kg)  BMI 36.82 kg/m2  Physical Exam:  Well appearing middle-aged woman, NAD HEENT: Unremarkable Neck:  7 cm JVD, no thyromegally Lungs:  Clear with no wheezes, rales, or rhonchi. Well-healed ICD incision. HEART:  Regular rate rhythm, no murmurs, no rubs, no clicks Abd:  soft, positive bowel sounds, no organomegally, no rebound, no guarding Ext:  2 plus pulses, no edema, no cyanosis, no clubbing Skin:  No rashes no nodules Neuro:  CN II through XII intact, motor grossly intact  DEVICE  Normal device function.  See PaceArt for details. Device at [facility].  Assess/Plan: 1. VT - she is maintaining NSR with no additional ICD therapies since her last visit. 2. Chronic systolic heart failure - she will continue her current meds. She has tried to keep her salt intake reduced 3. ICD - her b[employer] Sci device is at [facility]. We will schedule ICD gen change and I would anticipate a subpectoral implant as she has c/o the device bothering her when she turns over. 4. Obesity - we discussed  the importance of weight loss. She states that once her device has been placed, she plans to start exercising.   [name] [name],M.D.   

## 2016-03-04 MED FILL — Sodium Chloride Irrigation Soln 0.9%: Qty: 500 | Status: AC

## 2016-03-04 MED FILL — Gentamicin Sulfate Inj 40 MG/ML: INTRAMUSCULAR | Qty: 2 | Status: AC

## 2016-03-11 ENCOUNTER — Other Ambulatory Visit: Payer: Self-pay | Admitting: Cardiology

## 2016-03-13 ENCOUNTER — Other Ambulatory Visit: Payer: Self-pay

## 2016-03-13 ENCOUNTER — Ambulatory Visit (INDEPENDENT_AMBULATORY_CARE_PROVIDER_SITE_OTHER): Payer: Medicaid Other | Admitting: Pharmacist

## 2016-03-13 ENCOUNTER — Ambulatory Visit (INDEPENDENT_AMBULATORY_CARE_PROVIDER_SITE_OTHER): Payer: Medicaid Other

## 2016-03-13 DIAGNOSIS — Z5181 Encounter for therapeutic drug level monitoring: Secondary | ICD-10-CM | POA: Diagnosis not present

## 2016-03-13 DIAGNOSIS — I255 Ischemic cardiomyopathy: Secondary | ICD-10-CM

## 2016-03-13 DIAGNOSIS — I679 Cerebrovascular disease, unspecified: Secondary | ICD-10-CM

## 2016-03-13 LAB — POCT INR: INR: 2

## 2016-03-14 ENCOUNTER — Encounter: Payer: Self-pay | Admitting: Internal Medicine

## 2016-03-14 ENCOUNTER — Ambulatory Visit (INDEPENDENT_AMBULATORY_CARE_PROVIDER_SITE_OTHER): Payer: Medicaid Other | Admitting: *Deleted

## 2016-03-14 DIAGNOSIS — Z95810 Presence of automatic (implantable) cardiac defibrillator: Secondary | ICD-10-CM | POA: Diagnosis not present

## 2016-03-14 DIAGNOSIS — I255 Ischemic cardiomyopathy: Secondary | ICD-10-CM

## 2016-03-14 LAB — CUP PACEART INCLINIC DEVICE CHECK
Date Time Interrogation Session: 20170316040000
HighPow Impedance: 52 Ohm
Implantable Lead Implant Date: 20081119
Implantable Lead Implant Date: 20081119
Implantable Lead Location: 753859
Implantable Lead Location: 753860
Implantable Lead Model: 185
Implantable Lead Model: 4136
Implantable Lead Serial Number: 211124
Implantable Lead Serial Number: 28383796
Lead Channel Impedance Value: 540 Ohm
Lead Channel Impedance Value: 614 Ohm
Lead Channel Pacing Threshold Amplitude: 0.5 V
Lead Channel Pacing Threshold Amplitude: 1 V
Lead Channel Pacing Threshold Pulse Width: 0.5 ms
Lead Channel Pacing Threshold Pulse Width: 0.5 ms
Lead Channel Sensing Intrinsic Amplitude: 2.1 mV
Lead Channel Sensing Intrinsic Amplitude: 25 mV
Lead Channel Setting Pacing Amplitude: 2 V
Lead Channel Setting Pacing Amplitude: 2.4 V
Lead Channel Setting Pacing Pulse Width: 0.5 ms
Lead Channel Setting Sensing Sensitivity: 0.6 mV
Pulse Gen Serial Number: 204398

## 2016-03-14 NOTE — Progress Notes (Signed)
Wound check appointment. Steri-strips previously removed by patient. Wound without redness or edema. Incision edges approximated, wound well healed. Small scab on left lateral incision dislodged during palpation of device site (site well healed underneath). Normal device function. Thresholds, sensing, and impedances consistent with implant measurements. Device programmed at chronic outputs. Histogram distribution appropriate for patient and level of activity. No mode switches or ventricular arrhythmias noted. Patient educated about wound care, arm mobility, shock plan. Patient aware to set up Latitude monitor, instructions printed from online manual. ROV with GT on 06/04/16.

## 2016-03-15 ENCOUNTER — Encounter: Payer: Self-pay | Admitting: Cardiology

## 2016-03-15 ENCOUNTER — Ambulatory Visit (INDEPENDENT_AMBULATORY_CARE_PROVIDER_SITE_OTHER): Payer: Medicaid Other | Admitting: Cardiology

## 2016-03-15 VITALS — BP 122/70 | HR 60 | Ht 66.0 in | Wt 221.0 lb

## 2016-03-15 DIAGNOSIS — I5022 Chronic systolic (congestive) heart failure: Secondary | ICD-10-CM | POA: Diagnosis not present

## 2016-03-15 DIAGNOSIS — I779 Disorder of arteries and arterioles, unspecified: Secondary | ICD-10-CM | POA: Diagnosis not present

## 2016-03-15 DIAGNOSIS — Z95810 Presence of automatic (implantable) cardiac defibrillator: Secondary | ICD-10-CM

## 2016-03-15 DIAGNOSIS — I255 Ischemic cardiomyopathy: Secondary | ICD-10-CM | POA: Diagnosis not present

## 2016-03-15 DIAGNOSIS — I739 Peripheral vascular disease, unspecified: Secondary | ICD-10-CM

## 2016-03-15 NOTE — Patient Instructions (Signed)
Your physician recommends that you continue on your current medications as directed. Please refer to the Current Medication list given to you today. Your physician has requested that you have a lower extremity arterial exercise duplex. During this test, exercise and ultrasound are used to evaluate arterial blood flow in the legs. Allow one hour for this exam. There are no restrictions or special instructions. Your physician has requested that you have a carotid duplex. This test is an ultrasound of the carotid arteries in your neck. It looks at blood flow through these arteries that supply the brain with blood. Allow one hour for this exam. There are no restrictions or special instructions. Your physician recommends that you schedule a follow-up appointment in: 3 months.

## 2016-03-15 NOTE — Progress Notes (Signed)
Cardiology Office Note  Date: 03/15/2016   ID: [name] [name], DOB [birth year], MRN [id number]  PCP: Fredirick Maudlin, MD  Primary Cardiologist: Nona Dell, MD   Chief Complaint  Patient presents with  . Cardiomyopathy    History of Present Illness: [name] [name] is a 55 y.o. female last seen in January. She presents for a routine follow-up visit. Overall describes NYHA class II dyspnea at this time, no angina symptoms.  We discussed her recent follow-up echocardiogram, LVEF in the range of 30-35% with grade 1 diastolic dysfunction. This is improved compared to her outside testing from last year. Woman over her medications which are outlined below, she has had no changes in cardiac regimen.  She saw Dr. Ladona Ridgel in February at which time it was noted that her ICD was at [facility].she underwent recent generator change.follow-up interrogation showed normal device function. No mode switches or ventricular arrhythmias. Device pocket site has been healing well.  She continues on Coumadin with follow-up in the anticoagulation clinic.  She has not had recent follow-up carotid Dopplers or lower extremity arterial studies. She does report leg pain, left worse than right, but difficult to completely correlate with claudication overall. She also stopped statin therapy and this has led to some improvement.  Past Medical History  Diagnosis Date  . History of pneumonia   . Chronic systolic heart failure (HCC)   . Stroke [facility])     Total occlusion of the right internal carotid  . Coronary atherosclerosis of native coronary artery     Severe two vessel disease   . Cocaine abuse in remission   . Raynaud's phenomenon   . Essential hypertension, benign   . Mixed hyperlipidemia   . Anticardiolipin antibody positive     Chronic Coumadin  . PVD (peripheral vascular disease) (HCC)   . Ischemic cardiomyopathy     LVEF 30-35%  . Headache     Past Surgical History  Procedure Laterality Date  .  Appendectomy    . Laryngeal polyp excision    . L1 corpectomy   12/10  . Cardiac defibrillator placement      St.Jude ICD  . Abdominal aortagram N/A 05/25/2014    Procedure: ABDOMINAL Ronny Flurry;  Surgeon: Iran Ouch, MD;  Location: [facility] CATH LAB;  Service: Cardiovascular;  Laterality: N/A;  . Ep implantable device N/A 03/01/2016    Procedure: ICD Generator Changeout;  Surgeon: [name], MD;  Location: [facility] INVASIVE CV LAB;  Service: Cardiovascular;  Laterality: N/A;    Current Outpatient Prescriptions  Medication Sig Dispense Refill  . Apple Cider Vinegar 300 MG TABS Take 300 mg by mouth daily.    Marland Kitchen aspirin EC 81 MG tablet Take 81 mg by mouth daily.    Marland Kitchen atorvastatin (LIPITOR) 10 MG tablet Take 10 mg by mouth every other day. Reported on 03/14/2016    . B Complex-Folic Acid (B COMPLEX-VITAMIN B12 PO) Take 1 tablet by mouth daily.     . carvedilol (COREG) 12.5 MG tablet TAKE 1 TABLET BY MOUTH TWICE DAILY WITH MEALS 180 tablet 3  . cetirizine (ZYRTEC) 10 MG tablet Take 10 mg by mouth daily.     . citalopram (CELEXA) 20 MG tablet Take 40 mg by mouth daily.     . Coenzyme Q10 (CO Q 10 PO) Take 1 capsule by mouth daily.    Marland Kitchen docusate sodium (COLACE) 100 MG capsule Take 100 mg by mouth daily as needed for mild constipation.     Marland Kitchen  furosemide (LASIX) 80 MG tablet Take 1 tablet (80 mg total) by mouth daily. (Patient taking differently: Take 80 mg by mouth daily. May take an extra  for weight gain of 3-4 pounds) 90 tablet 3  . gabapentin (NEURONTIN) 300 MG capsule Take 300 mg by mouth 2 (two) times daily.    Marland Kitchen guaiFENesin (MUCINEX) 600 MG 12 hr tablet Take 600 mg by mouth 2 (two) times daily as needed for cough or to loosen phlegm. Reported on 02/29/2016    . HYDROcodone-acetaminophen (NORCO) 10-325 MG per tablet Take 0.5 tablets by mouth 4 (four) times daily as needed for moderate pain or severe pain. pain    . ibuprofen (ADVIL,MOTRIN) 200 MG tablet Take 400 mg by mouth every 6 (six) hours as  needed (PAIN). pain    . isosorbide mononitrate (IMDUR) 30 MG 24 hr tablet TAKE 1 TABLET BY MOUTH DAILY 90 tablet 3  . losartan (COZAAR) 25 MG tablet Take 0.5 tablets (12.5 mg total) by mouth daily. 45 tablet 3  . methocarbamol (ROBAXIN) 500 MG tablet Take 500 mg by mouth as needed for muscle spasms.    . Multiple Vitamins-Minerals (MULTIVITAMINS THER. W/MINERALS) TABS Take 1 tablet by mouth daily.      . nitroGLYCERIN (NITROSTAT) 0.4 MG SL tablet Place 1 tablet (0.4 mg total) under the tongue every 5 (five) minutes x 3 doses as needed for chest pain. 25 tablet 3  . potassium chloride SA (K-DUR,KLOR-CON) 20 MEQ tablet Take 1 tablet (20 mEq total) by mouth daily. 90 tablet 3  . promethazine (PHENERGAN) 25 MG tablet Take 25 mg by mouth every 6 (six) hours as needed for nausea.    . ranitidine (ZANTAC) 150 MG tablet Take 150 mg by mouth 2 (two) times daily.    Marland Kitchen spironolactone (ALDACTONE) 25 MG tablet Take 1 tablet (25 mg total) by mouth daily. 90 tablet 3  . warfarin (COUMADIN) 10 MG tablet Take 10 mg by mouth daily at 6 PM.      No current facility-administered medications for this visit.   Allergies:  Penicillins; Metoclopramide hcl; Metoprolol; and Statins   Social History: The patient  reports that she quit smoking about 2 years ago. Her smoking use included E-cigarettes. She started smoking about 38 years ago. She has never used smokeless tobacco. She reports that she does not drink alcohol or use illicit drugs.   ROS:  Please see the history of present illness. Otherwise, complete review of systems is positive for arthritic pains, chronic back pain.  All other systems are reviewed and negative.   Physical Exam: VS:  BP 122/70 mmHg  Pulse 60  Ht  (1.676 m)  Wt 221 lb (100.245 kg)  BMI 35.69 kg/m2, BMI Body mass index is 35.69 kg/(m^2).  Wt Readings from Last 3 Encounters:  03/15/16 221 lb (100.245 kg)  03/01/16 215 lb (97.523 kg)  02/15/16 228 lb (103.42 kg)    General:  Overweight, chronically ill-appearing woman in no distress. HEENT: Conjunctiva and lids normal, oropharynx clear. Neck: Supple, no elevated JVP, no thyromegaly. Lungs: Clear to auscultation decreased at the bases, nonlabored breathing at rest. Cardiac: Indistinct PMI, regular rate and rhythm, no S3, no pericardial rub. Abdomen: Soft but protuberant, nontender, bowel sounds present, no guarding or rebound. Extremities: 1+ edema, distal pulses 2+. Musculoskeletal: No kyphosis. Skin: Warm and dry. Neuropsychiatric: Alert and oriented 3, affect appropriate.  ECG: I personally reviewed the prior tracing from 02/15/2016 which showed sinus rhythm with anteroseptal infarct pattern.  Recent Labwork: 02/15/2016: BUN 16; Creat 0.78; Hemoglobin 14.3; Platelets 231; Potassium 4.0; Sodium 136     Component Value Date/Time   CHOL 162 10/14/2013 0855   TRIG 189* 10/14/2013 0855   HDL 33* 10/14/2013 0855   CHOLHDL 4.9 10/14/2013 0855   VLDL 38 10/14/2013 0855   LDLCALC 91 10/14/2013 0855    Other Studies Reviewed Today:  Workup in April 2016 at [facility] in [place] included a myocardial perfusion imaging which showed no active ischemia with fixed defects in the anterolateral and apical regions, LVEF 16%. Echocardiogram reported LVEF of approximately 20% with inferior, posterior, and apical akinesis, mild LVH with mild chamber dilatation, normal RV contraction, moderate left atrial enlargement, device wire present in the right heart, moderate mitral regurgitation, mild tricuspid regurgitation, RVSP not calculated.  Echocardiogram 03/13/2016: Study Conclusions  - Left ventricle: Wall thickness was increased in a pattern of mild  LVH. Systolic function was moderately to severely reduced. The  estimated ejection fraction was in the range of 30% to 35%.  Diffuse hypokinesis. Doppler parameters are consistent with  abnormal left ventricular relaxation (grade 1 diastolic  dysfunction). -  Aortic valve: Valve area (VTI): 3.64 cm^2. Valve area (Vmax):  3.27 cm^2. Valve area (Vmean): 2.91 cm^2. - Mitral valve: There was mild regurgitation. - Left atrium: The atrium was severely dilated.  Assessment and Plan:  1. Known 2 vessel CAD with Cardiolite study from last year showing fixed anterolateral and apical defect without active ischemia. Plan is to continue medical therapy.  2. Ischemic cardiomyopathy with LVEF in the range of 30-35%, better than last assessed in [place] last year. She seems to be tolerating her current medical regimen fairly well, and reports NYHA class II dyspnea at this time.  3. St. Jude ICD in place, status post recent generator change by Dr. Ladona Ridgel. No recent device shocks.  4. Carotid artery disease, known occlusion of the RICA with previous stroke. Less than 50% LICA stenosis by last assessment, she is due for carotid Dopplers.  5. Peripheral arterial disease, mild left iliac disease and significant distal left SFA stenosis with three-vessel runoff by angiography in 2015. We will obtain lower extremities arterial Dopplers for follow-up.  Current medicines were reviewed with the patient today.  Disposition: FU with me in 3 months.   Signed, [name], MD, [facility] 03/15/2016 2:58 PM    [facility] [address] [place], [place], [place] [address] Phone: [phone]; Fax: [phone]

## 2016-03-19 ENCOUNTER — Other Ambulatory Visit: Payer: Self-pay | Admitting: Cardiology

## 2016-03-19 DIAGNOSIS — I739 Peripheral vascular disease, unspecified: Secondary | ICD-10-CM

## 2016-03-28 ENCOUNTER — Ambulatory Visit: Payer: Medicaid Other

## 2016-03-28 DIAGNOSIS — I739 Peripheral vascular disease, unspecified: Secondary | ICD-10-CM

## 2016-03-28 DIAGNOSIS — I779 Disorder of arteries and arterioles, unspecified: Secondary | ICD-10-CM | POA: Diagnosis not present

## 2016-04-03 ENCOUNTER — Telehealth: Payer: Self-pay | Admitting: *Deleted

## 2016-04-03 NOTE — Telephone Encounter (Signed)
-----   Message from [name], MD sent at 03/29/2016  7:47 AM EDT ----- Reviewed report. Please let her know that the ABI studies were normal and do not suggest any major obstructive arterial disease affecting the legs.

## 2016-04-03 NOTE — Telephone Encounter (Signed)
Patient informed. 

## 2016-04-03 NOTE — Telephone Encounter (Signed)
-----   Message from [name], MD sent at 03/29/2016  7:48 AM EDT ----- Reviewed report. Total occlusion of the RICA. 1-39% LICA stenosis. Continue medical therapy and observation.

## 2016-04-09 ENCOUNTER — Ambulatory Visit (INDEPENDENT_AMBULATORY_CARE_PROVIDER_SITE_OTHER): Payer: Medicaid Other | Admitting: *Deleted

## 2016-04-09 ENCOUNTER — Other Ambulatory Visit: Payer: Self-pay | Admitting: Cardiology

## 2016-04-09 DIAGNOSIS — Z5181 Encounter for therapeutic drug level monitoring: Secondary | ICD-10-CM | POA: Diagnosis not present

## 2016-04-09 DIAGNOSIS — I679 Cerebrovascular disease, unspecified: Secondary | ICD-10-CM | POA: Diagnosis not present

## 2016-04-09 LAB — POCT INR: INR: 2.4

## 2016-05-02 ENCOUNTER — Other Ambulatory Visit (HOSPITAL_COMMUNITY): Payer: Self-pay | Admitting: Interventional Radiology

## 2016-05-02 DIAGNOSIS — I729 Aneurysm of unspecified site: Secondary | ICD-10-CM

## 2016-05-07 ENCOUNTER — Ambulatory Visit (INDEPENDENT_AMBULATORY_CARE_PROVIDER_SITE_OTHER): Payer: Medicare Other | Admitting: *Deleted

## 2016-05-07 ENCOUNTER — Other Ambulatory Visit: Payer: Self-pay | Admitting: Radiology

## 2016-05-07 DIAGNOSIS — Z5181 Encounter for therapeutic drug level monitoring: Secondary | ICD-10-CM | POA: Diagnosis not present

## 2016-05-07 DIAGNOSIS — I679 Cerebrovascular disease, unspecified: Secondary | ICD-10-CM

## 2016-05-07 LAB — POCT INR: INR: 2.3

## 2016-05-07 MED ORDER — ENOXAPARIN SODIUM 100 MG/ML ~~LOC~~ SOLN: 100.0000 mg | Freq: Two times a day (BID) | SUBCUTANEOUS | Status: DC

## 2016-05-07 NOTE — Patient Instructions (Addendum)
Wt. 100 kg   Labs 02/15/16:  SCr 0.78  CrCl 131.68  H/H: 14.3/43.6  5/7  Took last dose of coumadin 5/8  No lovenox or coumadin 5/9  INR 2.3  .  Lovenox 100mg  SQ @ 8pm 5/10  Lovenox 100mg  SQ @ 8am and 8pm 5/11   Lovenox 100mg  SQ @ 8am.  No lovenox in pm 5/12  No lovenox am -----procedure 10am-----coumadin 15mg  pm and lovenox 100mg  (if ok with Dr Corliss Skains) 5/13   Lovenox 100mg  SQ @ 8am and lovenox 100mg  8pm and coumadin 15mg  pm 5/14  Lovenox 100mg  SQ @ 8am and lovenox 100mg  8pm and coumadin 15mg  pm 5/15  Lovenox 100mg  SQ @ 8am and lovenox 100mg  8pm and coumadin 15mg  pm 5/16  Lovenox 100mg  SQ @ 8am ------INR appt

## 2016-05-09 ENCOUNTER — Other Ambulatory Visit: Payer: Self-pay | Admitting: Cardiology

## 2016-05-10 ENCOUNTER — Other Ambulatory Visit: Payer: Self-pay | Admitting: Radiology

## 2016-05-10 ENCOUNTER — Telehealth: Payer: Self-pay | Admitting: Cardiology

## 2016-05-10 ENCOUNTER — Ambulatory Visit (HOSPITAL_COMMUNITY): Admission: RE | Admit: 2016-05-10 | Payer: Medicaid Other | Source: Ambulatory Visit

## 2016-05-10 NOTE — Telephone Encounter (Signed)
Returned call to pt, pt mistakenly took Lovenox yesterday 05/09/16 in the pm, procedure was scheduled for 05/10/16 in the am.  Procedure had to be cancelled and r/s for 05/14/16.  Pt states she has done Lovenox in the past and she knows to stay on the Lovenox BID until 05/13/16 she will take am dosage and then hold pm dosage prior to procedure on 05/14/16.  Pt states she will follow previous instructions for resuming Coumadin and Lovenox post procedure.  Pt states she has enough Lovenox and a refill, does not need new rx sent in.  Rescheduled follow-up appt from 05/14/16 to 05/16/16.  Pt wishes to see Misty Stanley in [place] prior to the weekend to check INR.

## 2016-05-10 NOTE — Telephone Encounter (Signed)
[name] [name] took her Lovenox last evening. Had to have procedure re-scheduled until 05/10/16.  Patient states that she will continue with the injections and Needs to re-schedule her coumdin check.

## 2016-05-13 ENCOUNTER — Ambulatory Visit (INDEPENDENT_AMBULATORY_CARE_PROVIDER_SITE_OTHER): Payer: Medicaid Other | Admitting: *Deleted

## 2016-05-13 DIAGNOSIS — I679 Cerebrovascular disease, unspecified: Secondary | ICD-10-CM | POA: Diagnosis not present

## 2016-05-13 DIAGNOSIS — Z5181 Encounter for therapeutic drug level monitoring: Secondary | ICD-10-CM

## 2016-05-13 LAB — POCT INR: INR: 1.3

## 2016-05-13 MED ORDER — WARFARIN SODIUM 5 MG PO TABS: 5.0000 mg | ORAL_TABLET | Freq: Every day | ORAL | Status: DC

## 2016-05-13 NOTE — Patient Instructions (Signed)
5/15  INR 1.3  No lovenox tonight 5/16  No lovenox am -----procedure------Lovenox 100mg  pm (if OK with MD) & coumadin 15mg  5/17  Lovenox 100mg  8am and lovenox 100mg  8pm & coumadin 15mg  5/18  Lovenox 100mg  8am and lovenox 100mg  8pm & coumadin 15mg  5/19  Lovenox 100mg  8am and lovenox 100mg  8pm & coumadin 15mg  5/20  Lovenox 100mg  8am and lovenox 100mg  8pm & coumadin 15mg  5/21  Lovenox 100mg  8am and lovenox 100mg  8pm & coumadin 15mg  5/22  Lovenox 100mg  8am----INR appt @ 11:40am

## 2016-05-14 ENCOUNTER — Telehealth: Payer: Self-pay | Admitting: *Deleted

## 2016-05-14 ENCOUNTER — Ambulatory Visit (HOSPITAL_COMMUNITY)
Admission: RE | Admit: 2016-05-14 | Discharge: 2016-05-14 | Disposition: A | Discharge status: Home / Self Care | Payer: Medicaid Other | Source: Ambulatory Visit | Attending: Interventional Radiology | Admitting: Interventional Radiology

## 2016-05-14 DIAGNOSIS — I729 Aneurysm of unspecified site: Secondary | ICD-10-CM

## 2016-05-14 MED ORDER — SODIUM CHLORIDE 0.9 % IV SOLN: Freq: Once | INTRAVENOUS | Status: DC

## 2016-05-14 NOTE — Progress Notes (Signed)
Patient ID: [name] [name], female   DOB: Dec 04, 1961, 55 y.o.   MRN: [id number]  Pt was scheduled for follow up cerebral arteriogram today Has been off Coumadin and on Lovenox x 5 days  Evaluation today reveals multiple ecchymotic tender areas on low abdominal skin Exact spots of Lovenox shots One are is different though Noted redness and swelling approx 10 cm x 6 cm in size Very tender to touch' Skin warm to touch  Discussed with Dr Corliss Skains He has examined pt  Feels area is cellulitis  Pt now referred to her MD- Dr Shaune Pollack for evaluation Will cancel cerebral arteriogram for now  Pt will hear from scheduler for further plan

## 2016-05-14 NOTE — Telephone Encounter (Signed)
Pt states Dr Eustace Quail would not do procedure today because of inflamed area on stomach from Lovenox injection.  Wants her to see PCP for antibiotic.  Pt states she will see Dr [name] this afternoon or tomorrow morning.  She is applying ice to site.  Pt will continue lovenox injections and restart coumadin as ordered and come for INR appt 5/22.  Procedure is postponed till further notice.

## 2016-05-15 ENCOUNTER — Telehealth: Payer: Self-pay | Admitting: Cardiology

## 2016-05-15 DIAGNOSIS — M7981 Nontraumatic hematoma of soft tissue: Secondary | ICD-10-CM | POA: Diagnosis not present

## 2016-05-15 NOTE — Telephone Encounter (Signed)
Patient called about needing lovanox shots  She asked that you call here back after lunch

## 2016-05-15 NOTE — Telephone Encounter (Signed)
Dr Fatima Sanger office rescheduled procedure for 05/21/16.  Pt is still on lovenox injections and off coumadin.  She will come for INR appt on 5/22 for results prior to procedure.

## 2016-05-20 ENCOUNTER — Other Ambulatory Visit: Payer: Self-pay | Admitting: *Deleted

## 2016-05-20 ENCOUNTER — Other Ambulatory Visit: Payer: Self-pay | Admitting: Cardiology

## 2016-05-20 ENCOUNTER — Ambulatory Visit (INDEPENDENT_AMBULATORY_CARE_PROVIDER_SITE_OTHER): Payer: Medicare Other | Admitting: *Deleted

## 2016-05-20 DIAGNOSIS — I679 Cerebrovascular disease, unspecified: Secondary | ICD-10-CM

## 2016-05-20 DIAGNOSIS — Z5181 Encounter for therapeutic drug level monitoring: Secondary | ICD-10-CM

## 2016-05-20 LAB — POCT INR: INR: 1

## 2016-05-20 MED ORDER — FUROSEMIDE 80 MG PO TABS: 80.0000 mg | ORAL_TABLET | Freq: Every day | ORAL | Status: DC

## 2016-05-21 ENCOUNTER — Encounter (HOSPITAL_COMMUNITY): Payer: Self-pay

## 2016-05-21 ENCOUNTER — Other Ambulatory Visit (HOSPITAL_COMMUNITY): Payer: Self-pay | Admitting: Interventional Radiology

## 2016-05-21 ENCOUNTER — Ambulatory Visit (HOSPITAL_COMMUNITY)
Admission: RE | Admit: 2016-05-21 | Discharge: 2016-05-21 | Disposition: A | Discharge status: Home / Self Care | Payer: Medicare Other | Source: Ambulatory Visit | Attending: Interventional Radiology | Admitting: Interventional Radiology

## 2016-05-21 DIAGNOSIS — D6861 Antiphospholipid syndrome: Secondary | ICD-10-CM | POA: Insufficient documentation

## 2016-05-21 DIAGNOSIS — E782 Mixed hyperlipidemia: Secondary | ICD-10-CM | POA: Insufficient documentation

## 2016-05-21 DIAGNOSIS — Z87891 Personal history of nicotine dependence: Secondary | ICD-10-CM | POA: Insufficient documentation

## 2016-05-21 DIAGNOSIS — I255 Ischemic cardiomyopathy: Secondary | ICD-10-CM | POA: Diagnosis not present

## 2016-05-21 DIAGNOSIS — I671 Cerebral aneurysm, nonruptured: Secondary | ICD-10-CM | POA: Diagnosis not present

## 2016-05-21 DIAGNOSIS — Z7982 Long term (current) use of aspirin: Secondary | ICD-10-CM | POA: Diagnosis not present

## 2016-05-21 DIAGNOSIS — Z8673 Personal history of transient ischemic attack (TIA), and cerebral infarction without residual deficits: Secondary | ICD-10-CM | POA: Diagnosis not present

## 2016-05-21 DIAGNOSIS — I739 Peripheral vascular disease, unspecified: Secondary | ICD-10-CM | POA: Insufficient documentation

## 2016-05-21 DIAGNOSIS — I6521 Occlusion and stenosis of right carotid artery: Secondary | ICD-10-CM | POA: Insufficient documentation

## 2016-05-21 DIAGNOSIS — I2510 Atherosclerotic heart disease of native coronary artery without angina pectoris: Secondary | ICD-10-CM | POA: Insufficient documentation

## 2016-05-21 DIAGNOSIS — I7300 Raynaud's syndrome without gangrene: Secondary | ICD-10-CM | POA: Diagnosis not present

## 2016-05-21 DIAGNOSIS — Z79899 Other long term (current) drug therapy: Secondary | ICD-10-CM | POA: Insufficient documentation

## 2016-05-21 DIAGNOSIS — I5022 Chronic systolic (congestive) heart failure: Secondary | ICD-10-CM | POA: Insufficient documentation

## 2016-05-21 DIAGNOSIS — Z880 Allergy status to penicillin: Secondary | ICD-10-CM | POA: Diagnosis not present

## 2016-05-21 DIAGNOSIS — I6501 Occlusion and stenosis of right vertebral artery: Secondary | ICD-10-CM | POA: Diagnosis not present

## 2016-05-21 DIAGNOSIS — I729 Aneurysm of unspecified site: Secondary | ICD-10-CM

## 2016-05-21 DIAGNOSIS — Z888 Allergy status to other drugs, medicaments and biological substances status: Secondary | ICD-10-CM | POA: Diagnosis not present

## 2016-05-21 DIAGNOSIS — Z7901 Long term (current) use of anticoagulants: Secondary | ICD-10-CM | POA: Insufficient documentation

## 2016-05-21 LAB — PROTIME-INR
INR: 1.04 (ref 0.00–1.49)
Prothrombin Time: 13.8 seconds (ref 11.6–15.2)

## 2016-05-21 LAB — CBC
HCT: 43.6 % (ref 36.0–46.0)
Hemoglobin: 14.1 g/dL (ref 12.0–15.0)
MCH: 29.4 pg (ref 26.0–34.0)
MCHC: 32.3 g/dL (ref 30.0–36.0)
MCV: 90.8 fL (ref 78.0–100.0)
Platelets: 245 10*3/uL (ref 150–400)
RBC: 4.8 MIL/uL (ref 3.87–5.11)
RDW: 13.2 % (ref 11.5–15.5)
WBC: 6.3 10*3/uL (ref 4.0–10.5)

## 2016-05-21 LAB — BASIC METABOLIC PANEL
Anion gap: 12 (ref 5–15)
BUN: 11 mg/dL (ref 6–20)
CO2: 26 mmol/L (ref 22–32)
Calcium: 9.4 mg/dL (ref 8.9–10.3)
Chloride: 100 mmol/L — ABNORMAL LOW (ref 101–111)
Creatinine, Ser: 0.78 mg/dL (ref 0.44–1.00)
GFR calc Af Amer: >60 mL/min (ref >60)
GFR calc non Af Amer: >60 mL/min (ref >60)
Glucose, Bld: 98 mg/dL (ref 65–99)
Potassium: 4.2 mmol/L (ref 3.5–5.1)
Sodium: 138 mmol/L (ref 135–145)

## 2016-05-21 LAB — APTT: aPTT: 35 seconds (ref 24–37)

## 2016-05-21 MED ORDER — FENTANYL CITRATE (PF) 100 MCG/2ML IJ SOLN
INTRAMUSCULAR | Status: AC
  Filled 2016-05-21: qty 2

## 2016-05-21 MED ORDER — IOPAMIDOL (ISOVUE-300) INJECTION 61%
INTRAVENOUS | Status: AC
  Administered 2016-05-21: 80 mL
  Filled 2016-05-21: qty 150

## 2016-05-21 MED ORDER — LIDOCAINE HCL 1 % IJ SOLN
INTRAMUSCULAR | Status: AC | PRN
  Administered 2016-05-21: 9 mL

## 2016-05-21 MED ORDER — MIDAZOLAM HCL 2 MG/2ML IJ SOLN
INTRAMUSCULAR | Status: AC
  Filled 2016-05-21: qty 2

## 2016-05-21 MED ORDER — HYDROCODONE-ACETAMINOPHEN 5-325 MG PO TABS
1.0000 | ORAL_TABLET | Freq: Once | ORAL | Status: AC
  Administered 2016-05-21: 1 via ORAL

## 2016-05-21 MED ORDER — HEPARIN SODIUM (PORCINE) 1000 UNIT/ML IJ SOLN
INTRAMUSCULAR | Status: AC | PRN
  Administered 2016-05-21: 1000 [IU] via INTRAVENOUS

## 2016-05-21 MED ORDER — PROMETHAZINE HCL 25 MG PO TABS
ORAL_TABLET | ORAL | Status: AC
  Filled 2016-05-21: qty 1

## 2016-05-21 MED ORDER — MIDAZOLAM HCL 2 MG/2ML IJ SOLN
INTRAMUSCULAR | Status: AC | PRN
  Administered 2016-05-21 (×2): 1 mg via INTRAVENOUS

## 2016-05-21 MED ORDER — FENTANYL CITRATE (PF) 100 MCG/2ML IJ SOLN
INTRAMUSCULAR | Status: AC | PRN
  Administered 2016-05-21 (×2): 25 ug via INTRAVENOUS

## 2016-05-21 MED ORDER — SODIUM CHLORIDE 0.9 % IV SOLN: INTRAVENOUS | Status: AC

## 2016-05-21 MED ORDER — HYDROCODONE-ACETAMINOPHEN 5-325 MG PO TABS
ORAL_TABLET | ORAL | Status: AC
  Filled 2016-05-21: qty 1

## 2016-05-21 MED ORDER — SODIUM CHLORIDE 0.9 % IV SOLN
INTRAVENOUS | Status: AC | PRN
  Administered 2016-05-21: 75 mL/h via INTRAVENOUS

## 2016-05-21 MED ORDER — HEPARIN SODIUM (PORCINE) 1000 UNIT/ML IJ SOLN
INTRAMUSCULAR | Status: AC
  Filled 2016-05-21: qty 2

## 2016-05-21 MED ORDER — LIDOCAINE HCL 1 % IJ SOLN
INTRAMUSCULAR | Status: AC
  Filled 2016-05-21: qty 20

## 2016-05-21 MED ORDER — PROMETHAZINE HCL 25 MG PO TABS
25.0000 mg | ORAL_TABLET | Freq: Once | ORAL | Status: AC
  Administered 2016-05-21: 25 mg via ORAL

## 2016-05-21 NOTE — Procedures (Signed)
4 vessel cerebral arteriograms. Rt CFA approach. 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 2.Occluded RT ICA prox. 3.Approx 65 to 70 % stenosis of RT VA at origin

## 2016-05-21 NOTE — Sedation Documentation (Signed)
ETCO2 removed per Dr. Deveshwar 

## 2016-05-21 NOTE — H&P (Signed)
Chief Complaint: Intracranial aneurysm  Referring Physician(s): No referring physician  Supervising Physician: Julieanne Cotton  Patient Status: In-pt / Out-pt  History of Present Illness: [name] [name] is a 55 y.o. 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   There is also a chronic occlusion of the right internal carotid artery, with diminutive collaterals of anterior communicating artery and the right posterior communicating artery.  She is here today for 6 month follow up cerebral angiography to follow the aneurysm.  The plan initially was for her to be reffered to neurosurgery at [facility], however she did not go.  She has anticardiolipin antibody positive and takes Coumadin. She has held this and is taking Lovenox.  She was cancelled last week due to possible cellulitis of her abdominal fat pad where she has been giving her injections.  She feels well today and says her abdomen is improved.  She still has some bruising, but it is starting to resolve.  She is asymptomatic from a neurologic standpoint. No numbness, tingling, visual disturbance, dizzienss, syncope.   She also denies recent illness, fever/chills.   Past Medical History  Diagnosis Date  . History of pneumonia   . Chronic systolic heart failure (HCC)   . Stroke [facility])     Total occlusion of the right internal carotid  . Coronary atherosclerosis of native coronary artery     Severe two vessel disease   . Cocaine abuse in remission   . Raynaud's phenomenon   . Essential hypertension, benign   . Mixed hyperlipidemia   . Anticardiolipin antibody positive     Chronic Coumadin  . PVD (peripheral vascular disease) (HCC)   . Ischemic cardiomyopathy     LVEF 30-35%  . Headache     Past Surgical History  Procedure Laterality Date  . Appendectomy    . Laryngeal polyp excision    . L1 corpectomy   12/10  . Cardiac defibrillator placement     St.Jude ICD  . Abdominal aortagram N/A 05/25/2014    Procedure: ABDOMINAL Ronny Flurry;  Surgeon: Iran Ouch, MD;  Location: [facility] CATH LAB;  Service: Cardiovascular;  Laterality: N/A;  . Ep implantable device N/A 03/01/2016    Procedure: ICD Generator Changeout;  Surgeon: [name], MD;  Location: [facility] INVASIVE CV LAB;  Service: Cardiovascular;  Laterality: N/A;    Allergies: Penicillins; Metoclopramide hcl; Metoprolol; and Statins  Medications: Prior to Admission medications   Medication Sig Start Date End Date Taking? Authorizing Provider  Apple Cider Vinegar 300 MG TABS Take 300 mg by mouth daily.   Yes Historical Provider, MD  aspirin EC 81 MG tablet Take 81 mg by mouth daily.   Yes Historical Provider, MD  B Complex-Folic Acid (B COMPLEX-VITAMIN B12 PO) Take 1 tablet by mouth daily.    Yes Historical Provider, MD  carvedilol (COREG) 12.5 MG tablet TAKE 1 TABLET BY MOUTH TWICE DAILY WITH MEALS 03/11/16  Yes [name], MD  cetirizine (ZYRTEC) 10 MG tablet Take 10 mg by mouth daily.    Yes Historical Provider, MD  citalopram (CELEXA) 20 MG tablet Take 40 mg by mouth daily.    Yes Historical Provider, MD  Coconut Oil 1000 MG CAPS Take 1,000 mg by mouth daily.   Yes Historical Provider, MD  Coenzyme Q10 (CO Q 10 PO) Take 200 mg by mouth daily.    Yes Historical Provider, MD  enoxaparin (LOVENOX) 100 MG/ML injection  INJECT 1ML(100MG ) INTO THE SKIN EVERY 12 HOURS AS DIRECTED 05/20/16  Yes [name], MD  furosemide (LASIX) 80 MG tablet Take 1 tablet (80 mg total) by mouth daily. May take an extra 40mg  for weight gain of 3-4 pounds 05/20/16  Yes [name], MD  gabapentin (NEURONTIN) 300 MG capsule Take 300 mg by mouth 2 (two) times daily.   Yes Historical Provider, MD  guaiFENesin (MUCINEX) 600 MG 12 hr tablet Take 600 mg by mouth 2 (two) times daily as needed for cough or to loosen phlegm. Reported on 02/29/2016   Yes Historical Provider, MD  HYDROcodone-acetaminophen  (NORCO) 10-325 MG per tablet Take 0.5 tablets by mouth 4 (four) times daily as needed for moderate pain or severe pain. pain   Yes Historical Provider, MD  isosorbide mononitrate (IMDUR) 30 MG 24 hr tablet TAKE 1 TABLET BY MOUTH DAILY 01/17/16  Yes [name], MD  losartan (COZAAR) 25 MG tablet TAKE 1/2 OF A TABLET BY MOUTH DAILY 05/09/16  Yes [name], MD  methocarbamol (ROBAXIN) 500 MG tablet Take 500 mg by mouth as needed for muscle spasms.   Yes Historical Provider, MD  nitroGLYCERIN (NITROSTAT) 0.4 MG SL tablet Place 1 tablet (0.4 mg total) under the tongue every 5 (five) minutes x 3 doses as needed for chest pain. 07/12/15  Yes [name], MD  potassium chloride SA (K-DUR,KLOR-CON) 20 MEQ tablet Take 1 tablet (20 mEq total) by mouth daily. 07/26/15  Yes Dyann Kief, PA-C  promethazine (PHENERGAN) 25 MG tablet Take 25 mg by mouth every 6 (six) hours as needed for nausea.   Yes Historical Provider, MD  ranitidine (ZANTAC) 150 MG tablet Take 150 mg by mouth 2 (two) times daily.   Yes Historical Provider, MD  spironolactone (ALDACTONE) 25 MG tablet TAKE 1 TABLET BY MOUTH EVERY DAY 05/09/16  Yes [name], MD  warfarin (COUMADIN) 10 MG tablet Take 10 mg by mouth daily at 6 PM. Reported on 05/20/2016   Yes Historical Provider, MD  warfarin (COUMADIN) 5 MG tablet Take 1 tablet (5 mg total) by mouth daily. Take in addition to the 10mg  tablet 5/16 - 5/21 05/13/16  Yes [name], MD  docusate sodium (COLACE) 100 MG capsule Take 100 mg by mouth daily as needed for mild constipation.     Historical Provider, MD  ibuprofen (ADVIL,MOTRIN) 200 MG tablet Take 400 mg by mouth every 6 (six) hours as needed (PAIN). pain    Historical Provider, MD  Multiple Vitamins-Minerals (MULTIVITAMINS THER. W/MINERALS) TABS Take 1 tablet by mouth daily.      Historical Provider, MD     Family History  Problem Relation Age of Onset  . Family history unknown: Yes    Social History    Social History  . Marital Status: Single    Spouse Name: N/A  . Number of Children: N/A  . Years of Education: N/A   Occupational History  . 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 Smoking status: Former Smoker    Types: E-cigarettes    Start date: 03/03/1978    Quit date: 10/03/2013  . Smokeless tobacco: Never Used     Comment: still using e-cig - at 0.2mg  now, will go to 0 soon.    . Alcohol Use: No  . Drug Use: No     Comment: Prior history of cocaine  . Sexual Activity: Yes    Birth  Control/ Protection: None   Other Topics Concern  . None   Social History Narrative    Review of Systems: A 12 point ROS discussed  Review of Systems  Constitutional: Negative for fever, chills, activity change, appetite change and fatigue.  HENT: Negative.   Respiratory: Negative for cough, shortness of breath and wheezing.   Cardiovascular: Negative for chest pain.  Gastrointestinal: Negative for nausea, vomiting and abdominal pain.  Genitourinary: Negative.   Musculoskeletal: Negative.   Skin: Negative.   Neurological: Negative for dizziness, syncope, facial asymmetry, speech difficulty, weakness, light-headedness and numbness.  Hematological: Bruises/bleeds easily.  Psychiatric/Behavioral: Negative.     Vital Signs: BP 109/66 mmHg  Temp(Src) 97.7 F (36.5 C) (Oral)  Resp 8  Ht 5\' 7"  (1.702 m)  Wt 210 lb (95.255 kg)  BMI 32.88 kg/m2  SpO2 97%  Physical Exam  Constitutional: She is oriented to person, place, and time. She appears well-developed and well-nourished.  HENT:  Head: Normocephalic and atraumatic.  Eyes: EOM are normal.  Neck: Normal range of motion. Neck supple.  Cardiovascular: Normal rate, regular rhythm and normal heart sounds.   No murmur heard. Abdominal: Soft. Bowel sounds are normal.  Bruising still present but resolving  Musculoskeletal: Normal range of motion.  Neurological: She is alert and oriented to person, place, and  time. No cranial nerve deficit.  Skin: Skin is warm and dry.  Psychiatric: She has a normal mood and affect. Her behavior is normal. Judgment and thought content normal.  Vitals reviewed.   Mallampati Score:  MD Evaluation Airway: WNL Heart: WNL Abdomen: WNL Chest/ Lungs: WNL ASA  Classification: 2 Mallampati/Airway Score: Two  Imaging: No results found.  Labs:  CBC:  Recent Labs  11/03/15 0706 02/15/16 1638  WBC 5.9 8.4  HGB 12.8 14.3  HCT 40.3 43.6  PLT 146* 231    COAGS:  Recent Labs  04/09/16 1323 05/07/16 0959 05/13/16 0941 05/20/16 1143  INR 2.4 2.3 1.3 1.0    BMP:  Recent Labs  11/03/15 0706 02/15/16 1638  NA 140 136  K 4.9 4.0  CL 100* 98  CO2 29 28  GLUCOSE 81 83  BUN 13 16  CALCIUM 9.4 9.2  CREATININE 0.91 0.78  GFRNONAA >60  --   GFRAA >60  --     LIVER FUNCTION TESTS: No results for input(s): BILITOT, AST, ALT, ALKPHOS, PROT, ALBUMIN in the last 8760 hours.  TUMOR MARKERS: No results for input(s): AFPTM, CEA, CA199, CHROMGRNA in the last 8760 hours.  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  Will proceed with cerebral angiography today by Dr. Corliss Skains  Risks and Benefits discussed with the patient including, but not limited to bleeding, infection, vascular injury or contrast induced renal failure.  All of the patient's questions were answered, patient is agreeable to proceed. Consent signed and in chart.   Electronically Signed: Gwynneth Macleod PA-C 05/21/2016, 7:59 AM   I spent a total of  25 Minutes in face to face in clinical consultation, greater than 50% of which was counseling/coordinating care for cerebral angiography.

## 2016-05-21 NOTE — Discharge Instructions (Signed)

## 2016-05-28 ENCOUNTER — Telehealth: Payer: Self-pay | Admitting: Cardiology

## 2016-05-28 ENCOUNTER — Ambulatory Visit (INDEPENDENT_AMBULATORY_CARE_PROVIDER_SITE_OTHER): Payer: Medicare Other | Admitting: *Deleted

## 2016-05-28 DIAGNOSIS — I679 Cerebrovascular disease, unspecified: Secondary | ICD-10-CM

## 2016-05-28 DIAGNOSIS — Z5181 Encounter for therapeutic drug level monitoring: Secondary | ICD-10-CM | POA: Diagnosis not present

## 2016-05-28 LAB — POCT INR: INR: 2.2

## 2016-05-28 MED ORDER — FUROSEMIDE 40 MG PO TABS: 40.0000 mg | ORAL_TABLET | ORAL | Status: DC | PRN

## 2016-05-28 NOTE — Telephone Encounter (Signed)
Please call [facility] [phone] in reference to clarify directions of RX that was sent in today.

## 2016-05-28 NOTE — Telephone Encounter (Signed)
RX clarified with Lelon Mast at [facility]

## 2016-06-04 ENCOUNTER — Encounter: Payer: Self-pay | Admitting: Internal Medicine

## 2016-06-04 ENCOUNTER — Ambulatory Visit (INDEPENDENT_AMBULATORY_CARE_PROVIDER_SITE_OTHER): Payer: Medicare PPO | Admitting: Internal Medicine

## 2016-06-04 VITALS — BP 106/74 | HR 60 | Ht 67.0 in | Wt 212.0 lb

## 2016-06-04 DIAGNOSIS — I5022 Chronic systolic (congestive) heart failure: Secondary | ICD-10-CM

## 2016-06-04 NOTE — Progress Notes (Signed)
HPI Mrs. Selders returns today for followup. She is a very pleasant 55 year old woman with an ischemic cardiomyopathy, chronic systolic heart failure, well compensated, VT, status post ICD implantation. The patient has done well in the interim. She has had no ICD shocks. She denies peripheral edema. She has chronic claudication. She notes that her new pocket feels much better.   Allergies  Allergen Reactions  . Penicillins Anaphylaxis    Shock Has patient had a PCN reaction causing immediate rash, facial/tongue/throat swelling, SOB or lightheadedness with hypotension: Yes Has patient had a PCN reaction causing severe rash involving mucus membranes or skin necrosis: No Has patient had a PCN reaction that required hospitalization Yes Has patient had a PCN reaction occurring within the last 10 years: No If all of the above answers are "NO", then may proceed with Cephalosporin use.   . Metoclopramide Hcl     Non responsive  . Metoprolol Other (See Comments)    Syncope   . Statins Other (See Comments)    Myalgias     Current Outpatient Prescriptions  Medication Sig Dispense Refill  . aspirin EC 81 MG tablet Take 81 mg by mouth daily.    . B Complex-Folic Acid (B COMPLEX-VITAMIN B12 PO) Take 1 tablet by mouth daily.     . carvedilol (COREG) 12.5 MG tablet TAKE 1 TABLET BY MOUTH TWICE DAILY WITH MEALS 180 tablet 3  . cephALEXin (KEFLEX) 500 MG capsule Take 500 mg by mouth 3 (three) times daily.    . cetirizine (ZYRTEC) 10 MG tablet Take 10 mg by mouth daily.     . citalopram (CELEXA) 20 MG tablet Take 40 mg by mouth daily.     . Coconut Oil 1000 MG CAPS Take 1,000 mg by mouth daily.    . Coenzyme Q10 (CO Q 10 PO) Take 200 mg by mouth daily.     Marland Kitchen docusate sodium (COLACE) 100 MG capsule Take 100 mg by mouth daily as needed for mild constipation.     . enoxaparin (LOVENOX) 100 MG/ML injection INJECT 1ML(100MG ) INTO THE SKIN EVERY 12 HOURS AS DIRECTED 10 Syringe 1  . furosemide (LASIX) 40 MG  tablet Take 40 mg by mouth daily as needed (swelling).    . furosemide (LASIX) 80 MG tablet Take 1 tablet (80 mg total) by mouth daily. May take an extra  for weight gain of 3-4 pounds 135 tablet 3  . gabapentin (NEURONTIN) 300 MG capsule Take 300 mg by mouth 2 (two) times daily.    Marland Kitchen guaiFENesin (MUCINEX) 600 MG 12 hr tablet Take 600 mg by mouth 2 (two) times daily as needed for cough or to loosen phlegm. Reported on 02/29/2016    . HYDROcodone-acetaminophen (NORCO) 10-325 MG per tablet Take 0.5 tablets by mouth 4 (four) times daily as needed for moderate pain or severe pain. pain    . ibuprofen (ADVIL,MOTRIN) 200 MG tablet Take 400 mg by mouth every 6 (six) hours as needed (PAIN). pain    . isosorbide mononitrate (IMDUR) 30 MG 24 hr tablet TAKE 1 TABLET BY MOUTH DAILY 90 tablet 3  . losartan (COZAAR) 25 MG tablet TAKE 1/2 OF A TABLET BY MOUTH DAILY 45 tablet 3  . methocarbamol (ROBAXIN) 500 MG tablet Take 500 mg by mouth daily as needed for muscle spasms.     . Multiple Vitamins-Minerals (MULTIVITAMINS THER. W/MINERALS) TABS Take 1 tablet by mouth daily.      . nitroGLYCERIN (NITROSTAT) 0.4 MG SL tablet Place 1 tablet (0.4  mg total) under the tongue every 5 (five) minutes x 3 doses as needed for chest pain. 25 tablet 3  . potassium chloride SA (K-DUR,KLOR-CON) 20 MEQ tablet Take 1 tablet (20 mEq total) by mouth daily. 90 tablet 3  . promethazine (PHENERGAN) 25 MG tablet Take 25 mg by mouth every 6 (six) hours as needed for nausea.    . ranitidine (ZANTAC) 150 MG tablet Take 150 mg by mouth 2 (two) times daily.    Marland Kitchen spironolactone (ALDACTONE) 25 MG tablet TAKE 1 TABLET BY MOUTH EVERY DAY 90 tablet 3  . warfarin (COUMADIN) 10 MG tablet Take 10 mg by mouth daily at 6 PM. Reported on 05/20/2016    . warfarin (COUMADIN) 5 MG tablet Take 1 tablet (5 mg total) by mouth daily. Take in addition to the 10mg  tablet 5/16 - 5/21 10 tablet 0   No current facility-administered medications for this visit.      Past Medical History  Diagnosis Date  . History of pneumonia   . Chronic systolic heart failure (HCC)   . 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 Coronary atherosclerosis of native coronary artery     Severe two vessel disease   . Cocaine abuse in remission   . Raynaud's phenomenon   . Essential hypertension, benign   . Mixed hyperlipidemia   . Anticardiolipin antibody positive     Chronic Coumadin  . PVD (peripheral vascular disease) (HCC)   . Ischemic cardiomyopathy     LVEF 30-35%  . Headache     ROS:   All systems reviewed and negative except as noted in the HPI.   Past Surgical History  Procedure Laterality Date  . Appendectomy    . Laryngeal polyp excision    . L1 corpectomy   12/10  . Cardiac defibrillator placement      St.Jude ICD  . Abdominal aortagram N/A 05/25/2014    Procedure: ABDOMINAL Ronny Flurry;  Surgeon: Iran Ouch, MD;  Location: [facility] CATH LAB;  Service: Cardiovascular;  Laterality: N/A;  . Ep implantable device N/A 03/01/2016    Procedure: ICD Generator Changeout;  Surgeon: [name], MD;  Location: [facility] INVASIVE CV LAB;  Service: Cardiovascular;  Laterality: N/A;     Family History  Problem Relation Age of Onset  . Family history unknown: Yes     Social History   Social History  . Marital Status: Single    Spouse Name: N/A  . Number of Children: N/A  . Years of Education: N/A   Occupational History  . 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 Smoking status: Former Smoker    Types: E-cigarettes    Start date: 03/03/1978    Quit date: 10/03/2013  . Smokeless tobacco: Never Used     Comment: still using e-cig - at 0.2mg  now, will go to 0 soon.    . Alcohol Use: No  . Drug Use: No     Comment: Prior history of cocaine  . Sexual Activity: Yes    Birth Control/ Protection: None   Other Topics Concern  . Not on file   Social History Narrative     BP 106/74 mmHg  Pulse 60   Ht 5\' 7"  (1.702 m)  Wt 212 lb (96.163 kg)  BMI 33.20 kg/m2  Physical Exam:  Well appearing middle-aged woman, NAD HEENT: Unremarkable Neck:  7 cm JVD, no thyromegally Lungs:  Clear  with no wheezes, rales, or rhonchi. Well-healed ICD incision. HEART:  Regular rate rhythm, no murmurs, no rubs, no clicks Abd:  soft, positive bowel sounds, no organomegally, no rebound, no guarding Ext:  2 plus pulses, no edema, no cyanosis, no clubbing Skin:  No rashes no nodules Neuro:  CN II through XII intact, motor grossly intact  DEVICE  Normal device function.  See PaceArt for details.   Assess/Plan: 1. VT - she is maintaining NSR with no additional ICD therapies since her last visit. 2. Chronic systolic heart failure - she will continue her current meds. She has tried to keep her salt intake reduced 3. ICD - her boston Sci device is working normally. Will recheck in several months. 4. Obesity - she has started to exercise. Her weight has   [name] [name].D.

## 2016-06-04 NOTE — Patient Instructions (Signed)
Medication Instructions:  Your physician recommends that you continue on your current medications as directed. Please refer to the Current Medication list given to you today.   Labwork: None ordered   Testing/Procedures: None ordered   Follow-Up: Your physician wants you to follow-up in: 9 months with Dr Taylor You will receive a reminder letter in the mail two months in advance. If you don't receive a letter, please call our office to schedule the follow-up appointment.   Any Other Special Instructions Will Be Listed Below (If Applicable).     If you need a refill on your cardiac medications before your next appointment, please call your pharmacy.   

## 2016-06-07 ENCOUNTER — Ambulatory Visit (INDEPENDENT_AMBULATORY_CARE_PROVIDER_SITE_OTHER): Payer: Medicare PPO | Admitting: Cardiology

## 2016-06-07 ENCOUNTER — Encounter: Payer: Self-pay | Admitting: Cardiology

## 2016-06-07 VITALS — BP 122/78 | HR 78 | Ht 67.0 in | Wt 214.0 lb

## 2016-06-07 DIAGNOSIS — I6521 Occlusion and stenosis of right carotid artery: Secondary | ICD-10-CM

## 2016-06-07 DIAGNOSIS — Z95810 Presence of automatic (implantable) cardiac defibrillator: Secondary | ICD-10-CM | POA: Diagnosis not present

## 2016-06-07 DIAGNOSIS — D6859 Other primary thrombophilia: Secondary | ICD-10-CM

## 2016-06-07 DIAGNOSIS — I10 Essential (primary) hypertension: Secondary | ICD-10-CM

## 2016-06-07 DIAGNOSIS — I255 Ischemic cardiomyopathy: Secondary | ICD-10-CM | POA: Diagnosis not present

## 2016-06-07 NOTE — Patient Instructions (Signed)
Your physician wants you to follow-up in: 4 MONTHS WITH DR. MCDOWELL You will receive a reminder letter in the mail two months in advance. If you don't receive a letter, please call our office to schedule the follow-up appointment.  Your physician recommends that you continue on your current medications as directed. Please refer to the Current Medication list given to you today.  Thank you for choosing [facility]!!    

## 2016-06-07 NOTE — Progress Notes (Signed)
Cardiology Office Note  Date: 06/07/2016   ID: [name] [name], DOB [birth year], MRN [id number]  PCP: Fredirick Maudlin, MD  Primary Cardiologist: Nona Dell, MD   Chief Complaint  Patient presents with  . Cardiomyopathy    History of Present Illness: [name] [name] is a 55 y.o. female last seen in March. She presents for a routine follow-up visit. Overall doing well symptomatically, NYHA class 1-2 dyspnea, no angina symptoms.  She continues on Coumadin with follow-up in the anticoagulation clinic. No reported bleeding problems. She had a lot of trouble with soft-tissue hematomas in her abdomen while having the use Lovenox bridging while off Coumadin.  She just had a recent visit with Dr. Ladona Ridgel for follow-up of her St. Jude ICD. No interval ICD therapies documented. She had a generator change in March of this year.  I reviewed her medications. She continues on Coreg, Lasix, Imdur, Cozaar, Aldactone, and potassium supplements. Blood pressure tends to be on the low side. We have not tried a switch to [employer].  Past Medical History  Diagnosis Date  . History of pneumonia   . Chronic systolic heart failure (HCC)   . 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 Coronary atherosclerosis of native coronary artery     Severe two vessel disease   . Cocaine abuse in remission   . Raynaud's phenomenon   . Essential hypertension, benign   . Mixed hyperlipidemia   . Anticardiolipin antibody positive     Chronic Coumadin  . PVD (peripheral vascular disease) (HCC)   . Ischemic cardiomyopathy     LVEF 30-35%  . Headache     Past Surgical History  Procedure Laterality Date  . Appendectomy    . Laryngeal polyp excision    . L1 corpectomy   12/10  . Cardiac defibrillator placement      St.Jude ICD  . Abdominal aortagram N/A 05/25/2014    Procedure: ABDOMINAL Ronny Flurry;  Surgeon: Iran Ouch, MD;  Location: [facility] CATH LAB;  Service: Cardiovascular;   Laterality: N/A;  . Ep implantable device N/A 03/01/2016    Procedure: ICD Generator Changeout;  Surgeon: [name], MD;  Location: [facility] INVASIVE CV LAB;  Service: Cardiovascular;  Laterality: N/A;    Current Outpatient Prescriptions  Medication Sig Dispense Refill  . aspirin EC 81 MG tablet Take 81 mg by mouth daily.    . B Complex-Folic Acid (B COMPLEX-VITAMIN B12 PO) Take 1 tablet by mouth daily.     . carvedilol (COREG) 12.5 MG tablet TAKE 1 TABLET BY MOUTH TWICE DAILY WITH MEALS 180 tablet 3  . cephALEXin (KEFLEX) 500 MG capsule Take 500 mg by mouth 3 (three) times daily.    . cetirizine (ZYRTEC) 10 MG tablet Take 10 mg by mouth daily.     . citalopram (CELEXA) 20 MG tablet Take 40 mg by mouth daily.     . Coconut Oil 1000 MG CAPS Take 1,000 mg by mouth daily.    . Coenzyme Q10 (CO Q 10 PO) Take 200 mg by mouth daily.     Marland Kitchen docusate sodium (COLACE) 100 MG capsule Take 100 mg by mouth daily as needed for mild constipation.     . furosemide (LASIX) 40 MG tablet Take 40 mg by mouth daily as needed (swelling).    . furosemide (LASIX) 80 MG tablet Take 1 tablet (80 mg total) by mouth daily. May take an extra 40mg  for weight gain of  3-4 pounds 135 tablet 3  . gabapentin (NEURONTIN) 300 MG capsule Take 300 mg by mouth 2 (two) times daily.    Marland Kitchen guaiFENesin (MUCINEX) 600 MG 12 hr tablet Take 600 mg by mouth 2 (two) times daily as needed for cough or to loosen phlegm. Reported on 02/29/2016    . HYDROcodone-acetaminophen (NORCO) 10-325 MG per tablet Take 0.5 tablets by mouth 4 (four) times daily as needed for moderate pain or severe pain. pain    . ibuprofen (ADVIL,MOTRIN) 200 MG tablet Take 400 mg by mouth every 6 (six) hours as needed (PAIN). pain    . isosorbide mononitrate (IMDUR) 30 MG 24 hr tablet TAKE 1 TABLET BY MOUTH DAILY 90 tablet 3  . losartan (COZAAR) 25 MG tablet TAKE 1/2 OF A TABLET BY MOUTH DAILY 45 tablet 3  . methocarbamol (ROBAXIN) 500 MG tablet Take 500 mg by mouth daily as  needed for muscle spasms.     . Multiple Vitamins-Minerals (MULTIVITAMINS THER. W/MINERALS) TABS Take 1 tablet by mouth daily.      . nitroGLYCERIN (NITROSTAT) 0.4 MG SL tablet Place 1 tablet (0.4 mg total) under the tongue every 5 (five) minutes x 3 doses as needed for chest pain. 25 tablet 3  . potassium chloride SA (K-DUR,KLOR-CON) 20 MEQ tablet Take 1 tablet (20 mEq total) by mouth daily. 90 tablet 3  . promethazine (PHENERGAN) 25 MG tablet Take 25 mg by mouth every 6 (six) hours as needed for nausea.    . ranitidine (ZANTAC) 150 MG tablet Take 150 mg by mouth 2 (two) times daily.    Marland Kitchen spironolactone (ALDACTONE) 25 MG tablet TAKE 1 TABLET BY MOUTH EVERY DAY 90 tablet 3  . warfarin (COUMADIN) 10 MG tablet Take 10 mg by mouth daily at 6 PM. Reported on 05/20/2016    . warfarin (COUMADIN) 5 MG tablet Take 1 tablet (5 mg total) by mouth daily. Take in addition to the  tablet 5/16 - 5/21 10 tablet 0   No current facility-administered medications for this visit.   Allergies:  Penicillins; Metoclopramide hcl; Metoprolol; and Statins   Social History: The patient  reports that she quit smoking about 2 years ago. Her smoking use included E-cigarettes. She started smoking about 38 years ago. She has never used smokeless tobacco. She reports that she does not drink alcohol or use illicit drugs.   ROS:  Please see the history of present illness. Otherwise, complete review of systems is positive for some trouble with her balance, no recurring falls.  All other systems are reviewed and negative.   Physical Exam: VS:  BP 122/78 mmHg  Pulse 78  Ht  (1.702 m)  Wt 214 lb (97.07 kg)  BMI 33.51 kg/m2  SpO2 97%, BMI Body mass index is 33.51 kg/(m^2).  Wt Readings from Last 3 Encounters:  06/07/16 214 lb (97.07 kg)  06/04/16 212 lb (96.163 kg)  05/21/16 210 lb (95.255 kg)    General: Overweight woman in no distress. HEENT: Conjunctiva and lids normal, oropharynx clear. Neck: Supple, no  elevated JVP, no thyromegaly. Lungs: Clear to auscultation decreased at the bases, nonlabored breathing at rest. Cardiac: Indistinct PMI, regular rate and rhythm, no S3, no pericardial rub. Abdomen: Soft, resolving ecchymosis and hematoma left lower quadrant, bowel sounds present, no guarding or rebound. Extremities: 1+ edema, distal pulses 2+. Musculoskeletal: No kyphosis. Skin: Warm and dry. Neuropsychiatric: Alert and oriented 3, affect appropriate.  ECG: I personally reviewed the tracing from 06/04/2016 which showed sinus  rhythm with old anteroseptal infarct pattern.  Recent Labwork: 05/21/2016: BUN 11; Creatinine, Ser 0.78; Hemoglobin 14.1; Platelets 245; Potassium 4.2; Sodium 138   Other Studies Reviewed Today:  Workup in April 2016 at [facility] in [place] included a myocardial perfusion imaging which showed no active ischemia with fixed defects in the anterolateral and apical regions, LVEF 16%. Echocardiogram reported LVEF of approximately 20% with inferior, posterior, and apical akinesis, mild LVH with mild chamber dilatation, normal RV contraction, moderate left atrial enlargement, device wire present in the right heart, moderate mitral regurgitation, mild tricuspid regurgitation, RVSP not calculated.  Echocardiogram 03/13/2016: Study Conclusions  - Left ventricle: Wall thickness was increased in a pattern of mild  LVH. Systolic function was moderately to severely reduced. The  estimated ejection fraction was in the range of 30% to 35%.  Diffuse hypokinesis. Doppler parameters are consistent with  abnormal left ventricular relaxation (grade 1 diastolic  dysfunction). - Aortic valve: Valve area (VTI): 3.64 cm^2. Valve area (Vmax):  3.27 cm^2. Valve area (Vmean): 2.91 cm^2. - Mitral valve: There was mild regurgitation. - Left atrium: The atrium was severely dilated.  Lower extremity arterial Dopplers 03/28/2016: Normal bilateral ABIs representing improvement  compared to the prior examination.   Carotid Dopplers 03/28/2016: Occluded RICA with 1-39% LICA stenosis and normal subclavians.  Assessment and Plan:  1. Ischemic cardiomyopathy with LVEF 30-35% range representing improvement over prior assessment. She is clinically stable on the present regimen, no changes were made today.  2. St. Jude ICD in place, continue to follow with Dr. Ladona Ridgel. No recent device shocks.  3. History of hypertension, blood pressure actually tends to run on the low side and limits therapy to some degree. Stable today.  4. Carotid artery disease with occlusion of the RICA and mild disease of the LICA. Stable by recent carotid Dopplers.  5. Hypercoagulable state with anticardiolipin antibody positive, on chronic Coumadin.  Current medicines were reviewed with the patient today.  Disposition: FU with me in 4 months.   Signed, [name], MD, [facility] 06/07/2016 4:14 PM    [facility] at [facility] [address] [place], [place], [place] [address] Phone: [phone]; Fax: [phone]

## 2016-06-25 ENCOUNTER — Ambulatory Visit (INDEPENDENT_AMBULATORY_CARE_PROVIDER_SITE_OTHER): Payer: Medicaid Other | Admitting: *Deleted

## 2016-06-25 DIAGNOSIS — I679 Cerebrovascular disease, unspecified: Secondary | ICD-10-CM | POA: Diagnosis not present

## 2016-06-25 DIAGNOSIS — Z5181 Encounter for therapeutic drug level monitoring: Secondary | ICD-10-CM | POA: Diagnosis not present

## 2016-06-25 LAB — POCT INR: INR: 2.7

## 2016-08-06 ENCOUNTER — Ambulatory Visit (INDEPENDENT_AMBULATORY_CARE_PROVIDER_SITE_OTHER): Payer: Medicare PPO | Admitting: *Deleted

## 2016-08-06 ENCOUNTER — Other Ambulatory Visit: Payer: Self-pay | Admitting: *Deleted

## 2016-08-06 ENCOUNTER — Other Ambulatory Visit: Payer: Self-pay | Admitting: Physician Assistant

## 2016-08-06 DIAGNOSIS — I679 Cerebrovascular disease, unspecified: Secondary | ICD-10-CM | POA: Diagnosis not present

## 2016-08-06 DIAGNOSIS — Z5181 Encounter for therapeutic drug level monitoring: Secondary | ICD-10-CM | POA: Diagnosis not present

## 2016-08-06 LAB — POCT INR: INR: 2.7

## 2016-08-06 MED ORDER — FUROSEMIDE 40 MG PO TABS: 40.0000 mg | ORAL_TABLET | Freq: Every day | ORAL | 1 refills | Status: DC | PRN

## 2016-08-06 NOTE — Telephone Encounter (Signed)
New rx sent to pharmacy

## 2016-08-06 NOTE — Telephone Encounter (Signed)
-----   Message from Louanna Raw, RN sent at 08/06/2016 11:17 AM EDT ----- Pt is getting Lasix 40mg  (15 tablets/month) to take along with Lasix 80mg  daily.  Pt wants to increase Lasix 40mg  daily to #30 tablets/month because she feels she needs extra.  Doesn't think she is voiding enough compared to what she's drinking.

## 2016-08-07 ENCOUNTER — Other Ambulatory Visit: Payer: Self-pay | Admitting: Physician Assistant

## 2016-09-24 ENCOUNTER — Ambulatory Visit (INDEPENDENT_AMBULATORY_CARE_PROVIDER_SITE_OTHER): Payer: Medicare PPO | Admitting: *Deleted

## 2016-09-24 DIAGNOSIS — Z5181 Encounter for therapeutic drug level monitoring: Secondary | ICD-10-CM | POA: Diagnosis not present

## 2016-09-24 DIAGNOSIS — I679 Cerebrovascular disease, unspecified: Secondary | ICD-10-CM | POA: Diagnosis not present

## 2016-09-24 LAB — POCT INR: INR: 2.6

## 2016-10-07 ENCOUNTER — Other Ambulatory Visit: Payer: Self-pay | Admitting: Cardiology

## 2016-10-10 ENCOUNTER — Emergency Department (HOSPITAL_COMMUNITY)
Admission: EM | Admit: 2016-10-10 | Discharge: 2016-10-10 | Disposition: A | Discharge status: Home / Self Care | Payer: Medicare PPO | Attending: Emergency Medicine | Admitting: Emergency Medicine

## 2016-10-10 ENCOUNTER — Encounter (HOSPITAL_COMMUNITY): Payer: Self-pay | Admitting: Emergency Medicine

## 2016-10-10 DIAGNOSIS — Z87891 Personal history of nicotine dependence: Secondary | ICD-10-CM | POA: Insufficient documentation

## 2016-10-10 DIAGNOSIS — I110 Hypertensive heart disease with heart failure: Secondary | ICD-10-CM | POA: Insufficient documentation

## 2016-10-10 DIAGNOSIS — I5023 Acute on chronic systolic (congestive) heart failure: Secondary | ICD-10-CM | POA: Diagnosis not present

## 2016-10-10 DIAGNOSIS — R55 Syncope and collapse: Secondary | ICD-10-CM | POA: Insufficient documentation

## 2016-10-10 DIAGNOSIS — Z8673 Personal history of transient ischemic attack (TIA), and cerebral infarction without residual deficits: Secondary | ICD-10-CM | POA: Insufficient documentation

## 2016-10-10 DIAGNOSIS — Z7982 Long term (current) use of aspirin: Secondary | ICD-10-CM | POA: Diagnosis not present

## 2016-10-10 DIAGNOSIS — Z95810 Presence of automatic (implantable) cardiac defibrillator: Secondary | ICD-10-CM | POA: Insufficient documentation

## 2016-10-10 DIAGNOSIS — Z79899 Other long term (current) drug therapy: Secondary | ICD-10-CM | POA: Insufficient documentation

## 2016-10-10 DIAGNOSIS — Z7901 Long term (current) use of anticoagulants: Secondary | ICD-10-CM | POA: Insufficient documentation

## 2016-10-10 LAB — BASIC METABOLIC PANEL
Anion gap: 10 (ref 5–15)
BUN: 19 mg/dL (ref 6–20)
CO2: 23 mmol/L (ref 22–32)
Calcium: 9.3 mg/dL (ref 8.9–10.3)
Chloride: 99 mmol/L — ABNORMAL LOW (ref 101–111)
Creatinine, Ser: 0.84 mg/dL (ref 0.44–1.00)
GFR calc Af Amer: >60 mL/min (ref >60)
GFR calc non Af Amer: >60 mL/min (ref >60)
Glucose, Bld: 101 mg/dL — ABNORMAL HIGH (ref 65–99)
Potassium: 4.1 mmol/L (ref 3.5–5.1)
Sodium: 132 mmol/L — ABNORMAL LOW (ref 135–145)

## 2016-10-10 LAB — CBC
HCT: 43.3 % (ref 36.0–46.0)
Hemoglobin: 14.7 g/dL (ref 12.0–15.0)
MCH: 30.2 pg (ref 26.0–34.0)
MCHC: 33.9 g/dL (ref 30.0–36.0)
MCV: 89.1 fL (ref 78.0–100.0)
Platelets: 234 10*3/uL (ref 150–400)
RBC: 4.86 MIL/uL (ref 3.87–5.11)
RDW: 13 % (ref 11.5–15.5)
WBC: 12.2 10*3/uL — ABNORMAL HIGH (ref 4.0–10.5)

## 2016-10-10 LAB — I-STAT TROPONIN, ED: Troponin i, poc: 0.01 ng/mL (ref 0.00–0.08)

## 2016-10-10 NOTE — ED Notes (Signed)
Pt had steady gate and vitals are WDL. Pt states understanding of her discharge teaching.

## 2016-10-10 NOTE — ED Provider Notes (Signed)
[facility]-EM[facility] DEPT Provider Note   CSN: [id number] Arrival date & time: 10/10/16  1756     History   Chief Complaint Chief Complaint  Patient presents with  . Loss of Consciousness    HPI [name] [name] is a 55 y.o. female.  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 She has a known ischemic cardiomyopathy with an ejection fraction of 30-35%. She was upstairs in the hospital with her father who passed away just prior to her visit to the ER, she has been crying all day, she has been feeling lightheaded acutely after she became overheated as he passed, she settled down in the chair and had a brief episode of loss of consciousness, very quickly she felt back to normal and feels normal at this time without any other symptoms. No chest pain, no chest pain, no headache, no shortness of breath, no palpitations. She has been eating and drinking and had her last meal approximately 8 hours ago  Past Medical History:  Diagnosis Date  . Anticardiolipin antibody positive    Chronic Coumadin  . Chronic systolic heart failure (HCC)   . Cocaine abuse in remission   . Coronary atherosclerosis of native coronary artery    Severe two vessel disease   . Essential hypertension, benign   . Headache   . History of pneumonia   . Ischemic cardiomyopathy    LVEF 30-35%  . Mixed hyperlipidemia   . PVD (peripheral vascular disease) (HCC)   . Raynaud's phenomenon   . 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 Left-sided weakness   . Acute on chronic systolic heart failure (HCC) 07/26/2015  . Encounter for therapeutic drug monitoring 02/02/2014  .  Peripheral arterial disease (HCC) 11/09/2013  . Dual implantable cardioverter-defibrillator in situ 06/01/2012  . Long term current use of anticoagulant 03/22/2011  . Essential hypertension, benign 05/29/2010  . CHEST PAIN 05/29/2010  . HYPERLIPIDEMIA 09/26/2009  . Peripheral vascular disease (HCC) 06/23/2009  . Cardiomyopathy, ischemic 11/29/2008  . CEREBROVASCULAR DISEASE 11/29/2008  . ANTICARDIOLIPIN ANTIBODY SYNDROME 11/29/2008    Past Surgical History:  Procedure Laterality Date  . ABDOMINAL AORTAGRAM N/A 05/25/2014   Procedure: ABDOMINAL Ronny Flurry;  Surgeon: Iran Ouch, MD;  [facility] CATH LAB;  Service: Cardiovascular;  Laterality: N/A;  . APPENDECTOMY    . CARDIAC DEFIBRILLATOR PLACEMENT     St.Jude ICD  . EP IMPLANTABLE DEVICE N/A 03/01/2016   Procedure: ICD Generator Changeout;  Surgeon: [name], MD;  Location: [facility] INVASIVE CV LAB;  Service: Cardiovascular;  Laterality: N/A;  . L1 corpectomy   12/10  . Laryngeal polyp excision      OB History    No data available       Home Medications    Prior to Admission medications   Medication Sig Start Date End Date Taking? Authorizing Provider  aspirin EC 81 MG tablet Take 81 mg by mouth daily.    Historical Provider, MD  B Complex-Folic Acid (B COMPLEX-VITAMIN B12 PO) Take 1 tablet by mouth daily.     Historical Provider, MD  carvedilol (COREG) 12.5 MG tablet TAKE 1 TABLET BY MOUTH TWICE  DAILY WITH MEALS 03/11/16   [name], MD  cephALEXin (KEFLEX) 500 MG capsule Take 500 mg by mouth 3 (three) times daily.    Historical Provider, MD  cetirizine (ZYRTEC) 10 MG tablet Take 10 mg by mouth daily.     Historical Provider, MD  citalopram (CELEXA) 20 MG tablet Take 40 mg by mouth daily.     Historical Provider, MD  Coconut Oil 1000 MG CAPS Take 1,000 mg by mouth daily.    Historical Provider, MD  Coenzyme Q10 (CO Q 10 PO) Take 200 mg by mouth daily.     Historical Provider, MD  docusate sodium (COLACE) 100 MG  capsule Take 100 mg by mouth daily as needed for mild constipation.     Historical Provider, MD  furosemide (LASIX) 40 MG tablet TAKE 1 TABLET BY MOUTH DAILY AS NEEDED (SWELLING). 10/07/16   [name], MD  furosemide (LASIX) 80 MG tablet TAKE 1 TABLET (80 MG TOTAL) BY MOUTH DAILY. Patient taking differently: Take 80 mg by mouth daily. May take an extra 40 mg tablet if needed for weight gain of 3-4 lbs. 08/06/16   [name], MD  gabapentin (NEURONTIN) 300 MG capsule Take 300 mg by mouth 2 (two) times daily.    Historical Provider, MD  guaiFENesin (MUCINEX) 600 MG 12 hr tablet Take 600 mg by mouth 2 (two) times daily as needed for cough or to loosen phlegm. Reported on 02/29/2016    Historical Provider, MD  HYDROcodone-acetaminophen (NORCO) 10-325 MG per tablet Take 0.5 tablets by mouth 4 (four) times daily as needed for moderate pain or severe pain. pain    Historical Provider, MD  ibuprofen (ADVIL,MOTRIN) 200 MG tablet Take 400 mg by mouth every 6 (six) hours as needed (PAIN). pain    Historical Provider, MD  isosorbide mononitrate (IMDUR) 30 MG 24 hr tablet TAKE 1 TABLET BY MOUTH DAILY 01/17/16   [name], MD  losartan (COZAAR) 25 MG tablet TAKE 1/2 OF A TABLET BY MOUTH DAILY 05/09/16   [name], MD  methocarbamol (ROBAXIN) 500 MG tablet Take 500 mg by mouth daily as needed for muscle spasms.     Historical Provider, MD  Multiple Vitamins-Minerals (MULTIVITAMINS THER. W/MINERALS) TABS Take 1 tablet by mouth daily.      Historical Provider, MD  nitroGLYCERIN (NITROSTAT) 0.4 MG SL tablet Place 1 tablet (0.4 mg total) under the tongue every 5 (five) minutes x 3 doses as needed for chest pain. 07/12/15   [name], MD  potassium chloride SA (K-DUR,KLOR-CON) 20 MEQ tablet TAKE 1 TABLET (20 MEQ TOTAL) BY MOUTH DAILY. 08/07/16   [name], MD  promethazine (PHENERGAN) 25 MG tablet Take 25 mg by mouth every 6 (six) hours as needed for nausea.    Historical Provider, MD    ranitidine (ZANTAC) 150 MG tablet Take 150 mg by mouth 2 (two) times daily.    Historical Provider, MD  spironolactone (ALDACTONE) 25 MG tablet TAKE 1 TABLET BY MOUTH EVERY DAY 05/09/16   [name], MD  warfarin (COUMADIN) 10 MG tablet Take 10 mg by mouth daily at 6 PM. Reported on 05/20/2016    Historical Provider, MD  warfarin (COUMADIN) 5 MG tablet Take 1 tablet (5 mg total) by mouth daily. Take in addition to the 10mg  tablet 5/16 - 5/21 05/13/16   [name], MD    Family History Family History  Problem Relation Age of Onset  . Family history  unknown: Yes    Social History Social History  Substance Use Topics  . Smoking status: Former Smoker    Types: E-cigarettes    Start date: 03/03/1978    Quit date: 10/03/2013  . Smokeless tobacco: Never Used     Comment: still using e-cig - at 0.2mg  now, will go to 0 soon.    . Alcohol use No     Allergies   Penicillins; Metoclopramide hcl; Metoprolol; and Statins   Review of Systems Review of Systems  All other systems reviewed and are negative.    Physical Exam Updated Vital Signs BP 144/84 (BP Location: Right Arm)   Pulse 69   Temp 98 F (36.7 C) (Oral)   Resp 17   SpO2 99%   Physical Exam  Constitutional: She appears well-developed and well-nourished. No distress.  HENT:  Head: Normocephalic and atraumatic.  Mouth/Throat: Oropharynx is clear and moist. No oropharyngeal exudate.  Eyes: Conjunctivae and EOM are normal. Pupils are equal, round, and reactive to light. Right eye exhibits no discharge. Left eye exhibits no discharge. No scleral icterus.  Neck: Normal range of motion. Neck supple. No JVD present. No thyromegaly present.  Cardiovascular: Normal rate, regular rhythm, normal heart sounds and intact distal pulses.  Exam reveals no gallop and no friction rub.   No murmur heard. Pulmonary/Chest: Effort normal and breath sounds normal. No respiratory distress. She has no wheezes. She has no rales.   Abdominal: Soft. Bowel sounds are normal. She exhibits no distension and no mass. There is no tenderness.  Musculoskeletal: Normal range of motion. She exhibits no edema or tenderness.  Lymphadenopathy:    She has no cervical adenopathy.  Neurological: She is alert. Coordination normal.  Skin: Skin is warm and dry. No rash noted. No erythema.  Psychiatric: She has a normal mood and affect. Her behavior is normal.  Nursing note and vitals reviewed.    ED Treatments / Results  Labs (all labs ordered are listed, but only abnormal results are displayed) Labs Reviewed  BASIC METABOLIC PANEL - Abnormal; Notable for the following:       Result Value   Sodium 132 (*)    Chloride 99 (*)    Glucose, Bld 101 (*)    All other components within normal limits  CBC - Abnormal; Notable for the following:    WBC 12.2 (*)    All other components within normal limits  I-STAT TROPOININ, ED    EKG  EKG Interpretation  Date/Time:  Thursday October 10 2016 18:01:17 EDT Ventricular Rate:  77 PR Interval:  190 QRS Duration: 108 QT Interval:  394 QTC Calculation: 445 R Axis:   63 Text Interpretation:  Sinus rhythm with Premature atrial complexes with Abberant conduction Septal infarct , age undetermined Abnormal ECG since last tracing no significant change Confirmed by Gilles Trimpe  MD, Euretha Najarro ([id number]) on 10/10/2016 7:14:16 PM       Radiology No results found.  Procedures Procedures (including critical care time)  Medications Ordered in ED Medications - No data to display   Initial Impression / Assessment and Plan / ED Course  I have reviewed the triage vital signs and the nursing notes.  Pertinent labs & imaging results that were available during my care of the patient were reviewed by me and considered in my medical decision making (see chart for details).  Clinical Course    The patient is well-appearing, vitals, EKG and labs are unremarkable, the patient is stable for discharge,  likely situational,  doubt cardiac etiology  Final Clinical Impressions(s) / ED Diagnoses   Final diagnoses:  Syncope, unspecified syncope type    New Prescriptions New Prescriptions   No medications on file     Eber HongBrian Gildardo Tickner, MD 10/10/16 [phone]

## 2016-10-10 NOTE — ED Triage Notes (Signed)
Pt was upstairs in our hospital with family just 10 minutes ago when her father passed away and pt fell into chair beside her and had syncope event. Family states pt fell in chair and eyes rolled back and within seconds was able to answer questions. Pt has no complaints at this time. Pt has Dispensing optician.

## 2016-10-10 NOTE — Discharge Instructions (Signed)

## 2016-10-10 NOTE — ED Notes (Signed)
MD at bedside. 

## 2016-10-10 NOTE — ED Notes (Signed)
Pt has a Hx of PVC's and heart failure. Pt's father just passed today and she was upstairs standing by his bed for the last after he died. Pt states she has been eating and drinking regularly today. Pt thinks her LOC was due to stress of father passing and she locked her knees. Pt was able to make it to a chair and did not hit her head.

## 2016-10-30 ENCOUNTER — Other Ambulatory Visit: Payer: Self-pay | Admitting: Cardiology

## 2016-11-04 NOTE — Progress Notes (Signed)
Cardiology Office Note  Date: 11/05/2016   ID: [name] [name], DOB 12-06-1961, MRN [id number]  PCP: Fredirick Maudlin, MD  Primary Cardiologist: Nona Dell, MD   Chief Complaint  Patient presents with  . Cardiomyopathy    History of Present Illness: [name] [name] is a 55 y.o. female last seen in June. She was seen in the ER recently in October after an episode of brief syncope that occurred during emotional upset, her father just passed away. No acute issues were noted at that time. She comes in today stating that she is doing reasonably well. Still obviously going through the grieving process, but states that her father passed peacefully. She has been "stress eating" and has gained some weight. She does report compliance with her medications.  She continues on Coumadin with follow-up in the anticoagulation clinic. Reports no bleeding problems.  Also follows in the device clinic with Dr. Ladona Ridgel, [facility]. Jude ICD in place. No device shocks.  Past Medical History:  Diagnosis Date  . Anticardiolipin antibody positive    Chronic Coumadin  . Chronic systolic heart failure (HCC)   . Cocaine abuse in remission   . Coronary atherosclerosis of native coronary artery    Severe two vessel disease   . Essential hypertension, benign   . Headache   . History of pneumonia   . Ischemic cardiomyopathy    LVEF 30-35%  . Mixed hyperlipidemia   . PVD (peripheral vascular disease) (HCC)   . Raynaud's phenomenon   . Stroke [facility])    Total occlusion of the right internal carotid    Past Surgical History:  Procedure Laterality Date  . ABDOMINAL AORTAGRAM N/A 05/25/2014   Procedure: ABDOMINAL Ronny Flurry;  Surgeon: Iran Ouch, MD;  Location[facility] CATH LAB;  Service: Cardiovascular;  Laterality: N/A;  . APPENDECTOMY    . CARDIAC DEFIBRILLATOR PLACEMENT     St.Jude ICD  . EP IMPLANTABLE DEVICE N/A 03/01/2016   Procedure: ICD Generator Changeout;  Surgeon: [name], MD;  Location: [facility]  INVASIVE CV LAB;  Service: Cardiovascular;  Laterality: N/A;  . L1 corpectomy   12/10  . Laryngeal polyp excision      Current Outpatient Prescriptions  Medication Sig Dispense Refill  . aspirin EC 81 MG tablet Take 81 mg by mouth daily.    . B Complex-Folic Acid (B COMPLEX-VITAMIN B12 PO) Take 1 tablet by mouth daily.     . carvedilol (COREG) 12.5 MG tablet TAKE 1 TABLET BY MOUTH TWICE DAILY WITH MEALS 180 tablet 3  . cephALEXin (KEFLEX) 500 MG capsule Take 500 mg by mouth 3 (three) times daily.    . cetirizine (ZYRTEC) 10 MG tablet Take 10 mg by mouth daily.     . citalopram (CELEXA) 20 MG tablet Take 40 mg by mouth daily.     . Coconut Oil 1000 MG CAPS Take 1,000 mg by mouth daily.    . Coenzyme Q10 (CO Q 10 PO) Take 200 mg by mouth daily.     Marland Kitchen docusate sodium (COLACE) 100 MG capsule Take 100 mg by mouth daily as needed for mild constipation.     . furosemide (LASIX) 40 MG tablet TAKE 1 TABLET BY MOUTH DAILY AS NEEDED (SWELLING). 30 tablet 1  . furosemide (LASIX) 80 MG tablet TAKE 1 TABLET (80 MG TOTAL) BY MOUTH DAILY. (Patient taking differently: Take 80 mg by mouth daily. May take an extra 40 mg tablet if needed for weight gain of 3-4 lbs.) 90 tablet  3  . gabapentin (NEURONTIN) 300 MG capsule Take 300 mg by mouth 2 (two) times daily.    Marland Kitchen guaiFENesin (MUCINEX) 600 MG 12 hr tablet Take 600 mg by mouth 2 (two) times daily as needed for cough or to loosen phlegm. Reported on 02/29/2016    . HYDROcodone-acetaminophen (NORCO) 10-325 MG per tablet Take 0.5 tablets by mouth 4 (four) times daily as needed for moderate pain or severe pain. pain    . ibuprofen (ADVIL,MOTRIN) 200 MG tablet Take 400 mg by mouth every 6 (six) hours as needed (PAIN). pain    . isosorbide mononitrate (IMDUR) 30 MG 24 hr tablet TAKE 1 TABLET BY MOUTH DAILY 90 tablet 3  . losartan (COZAAR) 25 MG tablet TAKE 1/2 OF A TABLET BY MOUTH DAILY 45 tablet 3  . methocarbamol (ROBAXIN) 500 MG tablet Take 500 mg by mouth daily as  needed for muscle spasms.     . Multiple Vitamins-Minerals (MULTIVITAMINS THER. W/MINERALS) TABS Take 1 tablet by mouth daily.      Marland Kitchen NITROSTAT 0.4 MG SL tablet PLACE 1 TABLET UNDER THE TONGUE EVERY 5 MINUTES FOR 3 DOSES AS NEEDED FOR CHEST PAIN. 25 tablet 3  . potassium chloride SA (K-DUR,KLOR-CON) 20 MEQ tablet TAKE 1 TABLET (20 MEQ TOTAL) BY MOUTH DAILY. 90 tablet 3  . promethazine (PHENERGAN) 25 MG tablet Take 25 mg by mouth every 6 (six) hours as needed for nausea.    . ranitidine (ZANTAC) 150 MG tablet Take 150 mg by mouth 2 (two) times daily.    Marland Kitchen spironolactone (ALDACTONE) 25 MG tablet TAKE 1 TABLET BY MOUTH EVERY DAY 90 tablet 3  . warfarin (COUMADIN) 10 MG tablet Take 10 mg by mouth daily at 6 PM. Reported on 05/20/2016    . warfarin (COUMADIN) 5 MG tablet Take 1 tablet (5 mg total) by mouth daily. Take in addition to the 10mg  tablet 5/16 - 5/21 10 tablet 0   No current facility-administered medications for this visit.    Allergies:  Penicillins; Metoclopramide hcl; Metoprolol; and Statins   Social History: The patient  reports that she quit smoking about 3 years ago. Her smoking use included E-cigarettes. She started smoking about 38 years ago. She has never used smokeless tobacco. She reports that she does not drink alcohol or use drugs.   ROS:  Please see the history of present illness. Otherwise, complete review of systems is positive for chronic back and leg pain.  All other systems are reviewed and negative.   Physical Exam: VS:  BP 124/78   Pulse 74   Ht 5\' 6"  (1.676 m)   Wt 221 lb (100.2 kg)   SpO2 99%   BMI 35.67 kg/m , BMI Body mass index is 35.67 kg/m.  Wt Readings from Last 3 Encounters:  11/05/16 221 lb (100.2 kg)  06/07/16 214 lb (97.1 kg)  06/04/16 212 lb (96.2 kg)    General: Overweight woman in no distress. HEENT: Conjunctiva and lids normal, oropharynx clear. Neck: Supple, no elevated JVP, no thyromegaly. Lungs: Clear to auscultation decreased at the  bases, nonlabored breathing at rest. Cardiac: Indistinct PMI, regular rate and rhythm, no S3, no pericardial rub. Abdomen: Soft, resolving ecchymosis and hematoma left lower quadrant, bowel sounds present, no guarding or rebound. Extremities: 1+ edema, distal pulses 2+. Musculoskeletal: No kyphosis. Skin: Warm and dry. Neuropsychiatric: Alert and oriented 3, affect appropriate.  ECG: I personally reviewed the tracing from 10/10/2016 which showed sinus rhythm with PVC and old anteroseptal infarct  pattern.  Recent Labwork: 10/10/2016: BUN 19; Creatinine, Ser 0.84; Hemoglobin 14.7; Platelets 234; Potassium 4.1; Sodium 132     Component Value Date/Time   CHOL 162 10/14/2013 0855   TRIG 189 (H) 10/14/2013 0855   HDL 33 (L) 10/14/2013 0855   CHOLHDL 4.9 10/14/2013 0855   VLDL 38 10/14/2013 0855   LDLCALC 91 10/14/2013 0855    Other Studies Reviewed Today:  Echocardiogram 03/13/2016: Study Conclusions  - Left ventricle: Wall thickness was increased in a pattern of mild  LVH. Systolic function was moderately to severely reduced. The  estimated ejection fraction was in the range of 30% to 35%.  Diffuse hypokinesis. Doppler parameters are consistent with  abnormal left ventricular relaxation (grade 1 diastolic  dysfunction). - Aortic valve: Valve area (VTI): 3.64 cm^2. Valve area (Vmax):  3.27 cm^2. Valve area (Vmean): 2.91 cm^2. - Mitral valve: There was mild regurgitation. - Left atrium: The atrium was severely dilated.  Lower extremity arterial Dopplers 03/28/2016: Normal bilateral ABIs representing improvement compared to the prior examination.   Carotid Dopplers 03/28/2016: Occluded RICA with 1-39% LICA stenosis and normal subclavians.  Assessment and Plan:  1. Ischemic cardiomyopathy with chronic systolic heart failure, LVEF 30-35% range based on most recent testing. Plan to continue present medical regimen. She has gained weight, but this is caloric with recent  overeating and stress.  2. St. Jude ICD in place, no device shocks. Keep follow with Dr. Ladona Ridgelaylor.  3. Essential hypertension, systolic blood pressure in the 120s today. No changes to current regimen.  4. Hypercoagulable state with anti-Cardiolite pain antibody-positive. She remains on Coumadin.  5. Occluded right internal carotid artery with mild disease on the left side. Stable by carotid Dopplers.  Current medicines were reviewed with the patient today.  Disposition: Follow-up in 4 months.  Signed, Jonelle SidleSamuel G. Buren Havey, MD, [facility]ACC 11/05/2016 11:51 AM    [facility] [address], [place], [place] [address] Phone: [phone]; Fax: [phone]

## 2016-11-05 ENCOUNTER — Ambulatory Visit (INDEPENDENT_AMBULATORY_CARE_PROVIDER_SITE_OTHER): Payer: Medicare PPO | Admitting: *Deleted

## 2016-11-05 ENCOUNTER — Ambulatory Visit (INDEPENDENT_AMBULATORY_CARE_PROVIDER_SITE_OTHER): Payer: Medicare PPO | Admitting: Cardiology

## 2016-11-05 ENCOUNTER — Encounter: Payer: Self-pay | Admitting: Cardiology

## 2016-11-05 VITALS — BP 124/78 | HR 74 | Ht 66.0 in | Wt 221.0 lb

## 2016-11-05 DIAGNOSIS — I10 Essential (primary) hypertension: Secondary | ICD-10-CM

## 2016-11-05 DIAGNOSIS — Z5181 Encounter for therapeutic drug level monitoring: Secondary | ICD-10-CM

## 2016-11-05 DIAGNOSIS — D6859 Other primary thrombophilia: Secondary | ICD-10-CM

## 2016-11-05 DIAGNOSIS — I255 Ischemic cardiomyopathy: Secondary | ICD-10-CM

## 2016-11-05 DIAGNOSIS — I5022 Chronic systolic (congestive) heart failure: Secondary | ICD-10-CM

## 2016-11-05 DIAGNOSIS — I779 Disorder of arteries and arterioles, unspecified: Secondary | ICD-10-CM

## 2016-11-05 DIAGNOSIS — I739 Peripheral vascular disease, unspecified: Secondary | ICD-10-CM

## 2016-11-05 LAB — POCT INR: INR: 2.1

## 2016-11-05 NOTE — Patient Instructions (Signed)

## 2016-11-14 DIAGNOSIS — Z23 Encounter for immunization: Secondary | ICD-10-CM | POA: Diagnosis not present

## 2016-12-05 ENCOUNTER — Other Ambulatory Visit: Payer: Self-pay | Admitting: Cardiology

## 2017-01-27 ENCOUNTER — Telehealth: Payer: Self-pay | Admitting: *Deleted

## 2017-01-27 ENCOUNTER — Ambulatory Visit (INDEPENDENT_AMBULATORY_CARE_PROVIDER_SITE_OTHER): Payer: Medicare PPO | Admitting: *Deleted

## 2017-01-27 DIAGNOSIS — I255 Ischemic cardiomyopathy: Secondary | ICD-10-CM

## 2017-01-27 NOTE — Telephone Encounter (Signed)
Transmission received 01/25/17- ICD shock for "VF" noted 10/10/16. She did experience syncope after her father's passing and sought treatment in the ER- her ICD was not interrogated. She also followed up with Dr. Diona Browner in November.  Her home monitor was not connected properly until this weekend when it transmitted the episode.  I made her aware of the shock 10/10/16 and of [place] driving restrictions for 6 months after shock. I apologized that she did not receive proper follow up and that we are just now aware and therefore making her aware of ICD shock- she understands.   She is overdue for follow up with Dr. Graciela Husbands- she was aware and knows that a scheduler will contact her to arrange an appointment. She verbalizes understanding and is appreciative of call.

## 2017-01-27 NOTE — Progress Notes (Signed)
Remote ICD transmission.   

## 2017-01-28 ENCOUNTER — Encounter: Payer: Self-pay | Admitting: Cardiology

## 2017-01-30 ENCOUNTER — Ambulatory Visit (INDEPENDENT_AMBULATORY_CARE_PROVIDER_SITE_OTHER): Payer: Medicare PPO | Admitting: *Deleted

## 2017-01-30 DIAGNOSIS — Z5181 Encounter for therapeutic drug level monitoring: Secondary | ICD-10-CM

## 2017-01-30 LAB — POCT INR: INR: 2.9

## 2017-02-03 ENCOUNTER — Other Ambulatory Visit: Payer: Self-pay | Admitting: Cardiology

## 2017-02-06 LAB — CUP PACEART REMOTE DEVICE CHECK
Date Time Interrogation Session: 20180208103215
HighPow Impedance: 48 Ohm
Implantable Lead Implant Date: 20081119
Implantable Lead Implant Date: 20081119
Implantable Lead Location: 753859
Implantable Lead Location: 753860
Implantable Lead Model: 185
Implantable Lead Model: 4136
Implantable Lead Serial Number: 211124
Implantable Lead Serial Number: 28383796
Implantable Pulse Generator Implant Date: 20170303
Lead Channel Impedance Value: 503 Ohm
Lead Channel Impedance Value: 533 Ohm
Lead Channel Pacing Threshold Amplitude: 0.4 V
Lead Channel Pacing Threshold Amplitude: 0.8 V
Lead Channel Pacing Threshold Pulse Width: 0.5 ms
Lead Channel Pacing Threshold Pulse Width: 0.5 ms
Lead Channel Sensing Intrinsic Amplitude: 0.7 mV
Lead Channel Sensing Intrinsic Amplitude: 25 mV
Pulse Gen Serial Number: 204398

## 2017-02-07 ENCOUNTER — Encounter: Payer: Self-pay | Admitting: Internal Medicine

## 2017-02-12 ENCOUNTER — Encounter (INDEPENDENT_AMBULATORY_CARE_PROVIDER_SITE_OTHER): Payer: Self-pay

## 2017-02-12 ENCOUNTER — Ambulatory Visit (INDEPENDENT_AMBULATORY_CARE_PROVIDER_SITE_OTHER): Payer: Medicare PPO | Admitting: Internal Medicine

## 2017-02-12 ENCOUNTER — Encounter: Payer: Self-pay | Admitting: Internal Medicine

## 2017-02-12 DIAGNOSIS — Z95810 Presence of automatic (implantable) cardiac defibrillator: Secondary | ICD-10-CM

## 2017-02-12 DIAGNOSIS — I5022 Chronic systolic (congestive) heart failure: Secondary | ICD-10-CM | POA: Diagnosis not present

## 2017-02-12 NOTE — Patient Instructions (Signed)
Medication Instructions:  Your physician recommends that you continue on your current medications as directed. Please refer to the Current Medication list given to you today.   Labwork: None Ordered   Testing/Procedures: None Ordered   Follow-Up: Your physician wants you to follow-up in: 1 year with Dr. Ladona Ridgel. You will receive a reminder letter in the mail two months in advance. If you don't receive a letter, please call our office to schedule the follow-up appointment.  Remote monitoring is used to monitor your ICD from home. This monitoring reduces the number of office visits required to check your device to one time per year. It allows [place] to keep an eye on the functioning of your device to ensure it is working properly. You are scheduled for a device check from home on 05/14/17. You may send your transmission at any time that day. If you have a wireless device, the transmission will be sent automatically. After your physician reviews your transmission, you will receive a postcard with your next transmission date.     Any Other Special Instructions Will Be Listed Below (If Applicable).     If you need a refill on your cardiac medications before your next appointment, please call your pharmacy.

## 2017-02-12 NOTE — Progress Notes (Signed)
HPI Mrs. Buell returns today for followup. She is a very pleasant 56 year old woman with an ischemic cardiomyopathy, chronic systolic heart failure, well compensated, and VT, status post ICD implantation. The patient has done well in the interim. She has had a single  ICD shock back in the fall when her father died. She denies peripheral edema. She has chronic claudication. She notes that her new pocket has finally healed and is without discomfort.  Allergies  Allergen Reactions  . Penicillins Anaphylaxis    Shock Has patient had a PCN reaction causing immediate rash, facial/tongue/throat swelling, SOB or lightheadedness with hypotension: Yes Has patient had a PCN reaction causing severe rash involving mucus membranes or skin necrosis: No Has patient had a PCN reaction that required hospitalization Yes Has patient had a PCN reaction occurring within the last 10 years: No If all of the above answers are "NO", then may proceed with Cephalosporin use.   . Metoclopramide Hcl     Non responsive  . Metoprolol Other (See Comments)    Syncope   . Statins Other (See Comments)    Myalgias     Current Outpatient Prescriptions  Medication Sig Dispense Refill  . aspirin EC 81 MG tablet Take 81 mg by mouth daily.    . B Complex-Folic Acid (B COMPLEX-VITAMIN B12 PO) Take 1 tablet by mouth daily.     . carvedilol (COREG) 12.5 MG tablet TAKE 1 TABLET BY MOUTH TWICE DAILY WITH MEALS 180 tablet 3  . cephALEXin (KEFLEX) 500 MG capsule Take 500 mg by mouth 3 (three) times daily.    . cetirizine (ZYRTEC) 10 MG tablet Take 10 mg by mouth daily.     . citalopram (CELEXA) 20 MG tablet Take 40 mg by mouth daily.     . Coconut Oil 1000 MG CAPS Take 1,000 mg by mouth daily.    . Coenzyme Q10 (CO Q 10 PO) Take 200 mg by mouth daily.     Marland Kitchen docusate sodium (COLACE) 100 MG capsule Take 100 mg by mouth daily as needed for mild constipation.     . furosemide (LASIX) 40 MG tablet TAKE 1 TABLET BY MOUTH DAILY AS  NEEDED FOR SWELLING. 30 tablet 1  . furosemide (LASIX) 80 MG tablet TAKE 1 TABLET (80 MG TOTAL) BY MOUTH DAILY. (Patient taking differently: Take 80 mg by mouth daily. May take an extra 40 mg tablet if needed for weight gain of 3-4 lbs.) 90 tablet 3  . gabapentin (NEURONTIN) 300 MG capsule Take 300 mg by mouth 2 (two) times daily.    Marland Kitchen guaiFENesin (MUCINEX) 600 MG 12 hr tablet Take 600 mg by mouth 2 (two) times daily as needed for cough or to loosen phlegm. Reported on 02/29/2016    . HYDROcodone-acetaminophen (NORCO) 10-325 MG per tablet Take 0.5 tablets by mouth 4 (four) times daily as needed for moderate pain or severe pain. pain    . ibuprofen (ADVIL,MOTRIN) 200 MG tablet Take 400 mg by mouth every 6 (six) hours as needed (PAIN). pain    . isosorbide mononitrate (IMDUR) 30 MG 24 hr tablet TAKE 1 TABLET BY MOUTH DAILY 90 tablet 3  . losartan (COZAAR) 25 MG tablet TAKE 1/2 OF A TABLET BY MOUTH DAILY 45 tablet 3  . methocarbamol (ROBAXIN) 500 MG tablet Take 500 mg by mouth daily as needed for muscle spasms.     . Multiple Vitamins-Minerals (MULTIVITAMINS THER. W/MINERALS) TABS Take 1 tablet by mouth daily.      Marland Kitchen NITROSTAT  0.4 MG SL tablet PLACE 1 TABLET UNDER THE TONGUE EVERY 5 MINUTES FOR 3 DOSES AS NEEDED FOR CHEST PAIN. 25 tablet 3  . potassium chloride SA (K-DUR,KLOR-CON) 20 MEQ tablet TAKE 1 TABLET (20 MEQ TOTAL) BY MOUTH DAILY. 90 tablet 3  . promethazine (PHENERGAN) 25 MG tablet Take 25 mg by mouth every 6 (six) hours as needed for nausea.    . ranitidine (ZANTAC) 150 MG tablet Take 150 mg by mouth 2 (two) times daily.    Marland Kitchen spironolactone (ALDACTONE) 25 MG tablet TAKE 1 TABLET BY MOUTH EVERY DAY 90 tablet 3  . warfarin (COUMADIN) 10 MG tablet Take 10 mg by mouth daily at 6 PM. Reported on 05/20/2016    . warfarin (COUMADIN) 5 MG tablet Take 1 tablet (5 mg total) by mouth daily. Take in addition to the 10mg  tablet 5/16 - 5/21 10 tablet 0   No current facility-administered medications for this  visit.      Past Medical History:  Diagnosis Date  . Anticardiolipin antibody positive    Chronic Coumadin  . Chronic systolic heart failure (HCC)   . Cocaine abuse in remission   . Coronary atherosclerosis of native coronary artery    Severe two vessel disease   . Essential hypertension, benign   . Headache   . History of pneumonia   . Ischemic cardiomyopathy    LVEF 30-35%  . Mixed hyperlipidemia   . PVD (peripheral vascular disease) (HCC)   . Raynaud's phenomenon   . 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   Past Surgical History:  Procedure Laterality Date  . ABDOMINAL AORTAGRAM N/A 05/25/2014   Procedure: ABDOMINAL Ronny Flurry;  Surgeon: Iran Ouch, MD;  Location: [facility] CATH LAB;  Service: Cardiovascular;  Laterality: N/A;  . APPENDECTOMY    . CARDIAC DEFIBRILLATOR PLACEMENT     St.Jude ICD  . EP IMPLANTABLE DEVICE N/A 03/01/2016   Procedure: ICD Generator Changeout;  Surgeon: [name], MD;  Location: [facility] INVASIVE CV LAB;  Service: Cardiovascular;  Laterality: N/A;  . L1 corpectomy   12/10  . Laryngeal polyp excision       Family History  Problem Relation Age of Onset  . Family history unknown: Yes     Social History   Social History  . Marital status: Single    Spouse name: N/A  . Number of children: N/A  . Years of education: N/A   Occupational History  . 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 Smoking status: Former Smoker    Types: E-cigarettes    Start date: 03/03/1978    Quit date: 10/03/2013  . Smokeless tobacco: Never Used     Comment: still using e-cig - at 0.2mg  now, will go to 0 soon.    . Alcohol use No  . Drug use: No     Comment: Prior history of cocaine  . Sexual activity: Yes    Birth control/ protection: None   Other Topics Concern  . Not on file   Social History Narrative  . No narrative on file     BP  - 98/64, P - 75, R - 18, Wt. - 224  Physical Exam:  Well appearing middle-aged woman, NAD HEENT: Unremarkable Neck:  7 cm JVD, no thyromegally Lungs:  Clear with no wheezes, rales, or rhonchi.  Well-healed ICD incision. HEART:  Regular rate rhythm, no murmurs, no rubs, no clicks Abd:  soft, positive bowel sounds, no organomegally, no rebound, no guarding Ext:  2 plus pulses, no edema, no cyanosis, no clubbing Skin:  No rashes no nodules Neuro:  CN II through XII intact, motor grossly intact  DEVICE  Normal device function.  See PaceArt for details.   Assess/Plan: 1. VT - she is maintaining NSR with no additional ICD therapies since her last visit. 2. Chronic systolic heart failure - she will continue her current meds. She has tried to keep her salt intake reduced 3. ICD - her boston Sci device is working normally. Will recheck in several months. 4. Obesity - she has not been able to lose weight. Will follow. She is encouraged to increase her activity.  Leonia Reeves.D.

## 2017-02-13 LAB — CUP PACEART INCLINIC DEVICE CHECK
Date Time Interrogation Session: 20180214050000
HighPow Impedance: 51 Ohm
HighPow Impedance: 52 Ohm
Implantable Lead Implant Date: 20081119
Implantable Lead Implant Date: 20081119
Implantable Lead Location: 753859
Implantable Lead Location: 753860
Implantable Lead Model: 185
Implantable Lead Model: 4136
Implantable Lead Serial Number: 211124
Implantable Lead Serial Number: 28383796
Implantable Pulse Generator Implant Date: 20170303
Lead Channel Impedance Value: 581 Ohm
Lead Channel Impedance Value: 609 Ohm
Lead Channel Pacing Threshold Amplitude: 0.2 V
Lead Channel Pacing Threshold Amplitude: 0.4 V
Lead Channel Pacing Threshold Pulse Width: 0.5 ms
Lead Channel Pacing Threshold Pulse Width: 0.5 ms
Lead Channel Sensing Intrinsic Amplitude: 25 mV
Lead Channel Sensing Intrinsic Amplitude: 4.6 mV
Lead Channel Setting Pacing Amplitude: 2 V
Lead Channel Setting Pacing Amplitude: 2.4 V
Lead Channel Setting Pacing Pulse Width: 0.5 ms
Lead Channel Setting Sensing Sensitivity: 0.6 mV
Pulse Gen Serial Number: 204398

## 2017-02-17 DIAGNOSIS — I509 Heart failure, unspecified: Secondary | ICD-10-CM | POA: Diagnosis not present

## 2017-02-17 DIAGNOSIS — F419 Anxiety disorder, unspecified: Secondary | ICD-10-CM | POA: Diagnosis not present

## 2017-02-17 DIAGNOSIS — I25119 Atherosclerotic heart disease of native coronary artery with unspecified angina pectoris: Secondary | ICD-10-CM | POA: Diagnosis not present

## 2017-03-04 NOTE — Progress Notes (Signed)
Cardiology Office Note  Date: 03/05/2017   ID: [name] [name], DOB 1961-02-10, MRN [id number]  PCP: Fredirick Maudlin, MD  Primary Cardiologist: Nona Dell, MD   Chief Complaint  Patient presents with  . Cardiomyopathy    History of Present Illness: [name] [name] is a 56 y.o. female last seen in November 2017. She presents for a routine follow-up visit. No recent angina symptoms, palpitations, or sudden unexplained syncope. She has been stressed dealing with illness in her brother who has cancer and has not been doing well despite chemotherapy.  She continues to follow in the anticoagulation clinic on Coumadin. No reported bleeding episodes.  Device follow-up continues with Dr. Ladona Ridgel, St. Jude ICD in place. She had a recent visit in February. She did have a single ICD shock around the time that her father passed away.  We discussed her medications. Cardiac regimen includes Aldactone, Lasix, potassium supplement, Cozaar, Coreg, aspirin, and Imdur.  Past Medical History:  Diagnosis Date  . Anticardiolipin antibody positive    Chronic Coumadin  . Chronic systolic heart failure (HCC)   . Cocaine abuse in remission   . Coronary atherosclerosis of native coronary artery    Severe two vessel disease   . Essential hypertension, benign   . Headache   . History of pneumonia   . Ischemic cardiomyopathy    LVEF 30-35%  . Mixed hyperlipidemia   . PVD (peripheral vascular disease) (HCC)   . Raynaud's phenomenon   . 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 ABDOMINAL AORTAGRAM N/A 05/25/2014   Procedure: ABDOMINAL Ronny Flurry;  Surgeon: Iran Ouch, MD;  Location: [facility] CATH LAB;  Service: Cardiovascular;  Laterality: N/A;  . APPENDECTOMY    . CARDIAC DEFIBRILLATOR PLACEMENT     St.Jude ICD  . EP IMPLANTABLE DEVICE N/A 03/01/2016   Procedure: ICD Generator Changeout;  Surgeon: [name], MD;   Location: [facility] INVASIVE CV LAB;  Service: Cardiovascular;  Laterality: N/A;  . L1 corpectomy   12/10  . Laryngeal polyp excision      Current Outpatient Prescriptions  Medication Sig Dispense Refill  . aspirin EC 81 MG tablet Take 81 mg by mouth daily.    . B Complex-Folic Acid (B COMPLEX-VITAMIN B12 PO) Take 1 tablet by mouth daily.     . cetirizine (ZYRTEC) 10 MG tablet Take 10 mg by mouth daily as needed.     . citalopram (CELEXA) 20 MG tablet Take 40 mg by mouth daily.     . Coenzyme Q10 (CO Q 10 PO) Take 200 mg by mouth daily.     Marland Kitchen docusate sodium (COLACE) 100 MG capsule Take 100 mg by mouth daily as needed for mild constipation.     . furosemide (LASIX) 40 MG tablet TAKE 1 TABLET BY MOUTH DAILY AS NEEDED FOR SWELLING. 30 tablet 1  . furosemide (LASIX) 80 MG tablet TAKE 1 TABLET (80 MG TOTAL) BY MOUTH DAILY. (Patient taking differently: Take 80 mg by mouth daily. May take an extra 40 mg tablet if needed for weight gain of 3-4 lbs.) 90 tablet 3  . gabapentin (NEURONTIN) 300 MG capsule Take 300 mg by mouth 2 (two) times daily.    Marland Kitchen guaiFENesin (MUCINEX) 600 MG 12 hr tablet Take 600 mg by mouth 2 (two) times daily as needed for cough or to loosen phlegm. Reported on 02/29/2016    . HYDROcodone-acetaminophen (  NORCO) 10-325 MG per tablet Take 0.5 tablets by mouth 4 (four) times daily as needed for moderate pain or severe pain. pain    . ibuprofen (ADVIL,MOTRIN) 200 MG tablet Take 400 mg by mouth every 6 (six) hours as needed (PAIN). pain    . isosorbide mononitrate (IMDUR) 30 MG 24 hr tablet TAKE 1 TABLET BY MOUTH DAILY 90 tablet 3  . LORazepam (ATIVAN) 1 MG tablet Take 1 tablet by mouth 4 (four) times daily as needed.  5  . losartan (COZAAR) 25 MG tablet TAKE 1/2 OF A TABLET BY MOUTH DAILY 45 tablet 3  . Multiple Vitamins-Minerals (MULTIVITAMINS THER. W/MINERALS) TABS Take 1 tablet by mouth daily.      Marland Kitchen NITROSTAT 0.4 MG SL tablet PLACE 1 TABLET UNDER THE TONGUE EVERY 5 MINUTES FOR 3 DOSES AS  NEEDED FOR CHEST PAIN. 25 tablet 3  . potassium chloride SA (K-DUR,KLOR-CON) 20 MEQ tablet TAKE 1 TABLET (20 MEQ TOTAL) BY MOUTH DAILY. 90 tablet 3  . promethazine (PHENERGAN) 25 MG tablet Take 25 mg by mouth every 6 (six) hours as needed for nausea.    . ranitidine (ZANTAC) 150 MG tablet Take 150 mg by mouth 2 (two) times daily.    Marland Kitchen spironolactone (ALDACTONE) 25 MG tablet TAKE 1 TABLET BY MOUTH EVERY DAY 90 tablet 3  . warfarin (COUMADIN) 10 MG tablet Take 10 mg by mouth daily at 6 PM. Reported on 05/20/2016    . warfarin (COUMADIN) 5 MG tablet Take 1 tablet (5 mg total) by mouth daily. Take in addition to the 10mg  tablet 5/16 - 5/21 10 tablet 0  . carvedilol (COREG) 12.5 MG tablet TAKE 1 TABLET BY MOUTH TWICE DAILY WITH MEALS 180 tablet 3   No current facility-administered medications for this visit.    Allergies:  Penicillins; Metoclopramide hcl; Metoprolol; and Statins   Social History: The patient  reports that she quit smoking about 3 years ago. Her smoking use included E-cigarettes. She started smoking about 39 years ago. She has a 43.00 pack-year smoking history. She has never used smokeless tobacco. She reports that she does not drink alcohol or use drugs.   ROS:  Please see the history of present illness. Otherwise, complete review of systems is positive for stable claudication.  All other systems are reviewed and negative.   Physical Exam: VS:  BP 111/77   Pulse 60   Ht 5\' 6"  (1.676 m)   Wt 225 lb (102.1 kg)   BMI 36.32 kg/m , BMI Body mass index is 36.32 kg/m.  Wt Readings from Last 3 Encounters:  03/05/17 225 lb (102.1 kg)  11/05/16 221 lb (100.2 kg)  06/07/16 214 lb (97.1 kg)    General: Overweight woman in no distress. HEENT: Conjunctiva and lids normal, oropharynx clear. Neck: Supple, no elevated JVP, no thyromegaly. Lungs: Clear to auscultation decreased at the bases, nonlabored breathing at rest. Cardiac: Indistinct PMI, regular rate and rhythm, no S3, no  pericardial rub. Abdomen: Soft, resolving ecchymosis and hematoma left lower quadrant, bowel sounds present, no guarding or rebound. Extremities: 1+ edema, distal pulses 2+. Musculoskeletal: No kyphosis. Skin: Warm and dry. Neuropsychiatric: Alert and oriented 3, affect appropriate.  ECG: I personally reviewed the tracing from 10/10/2016 which showed sinus rhythm with PVC and old anteroseptal infarct pattern.  Recent Labwork: 10/10/2016: BUN 19; Creatinine, Ser 0.84; Hemoglobin 14.7; Platelets 234; Potassium 4.1; Sodium 132   Other Studies Reviewed Today:  Echocardiogram 03/13/2016: Study Conclusions  - Left ventricle: Wall thickness  was increased in a pattern of mild  LVH. Systolic function was moderately to severely reduced. The  estimated ejection fraction was in the range of 30% to 35%.  Diffuse hypokinesis. Doppler parameters are consistent with  abnormal left ventricular relaxation (grade 1 diastolic  dysfunction). - Aortic valve: Valve area (VTI): 3.64 cm^2. Valve area (Vmax):  3.27 cm^2. Valve area (Vmean): 2.91 cm^2. - Mitral valve: There was mild regurgitation. - Left atrium: The atrium was severely dilated.  Lower extremity arterial Dopplers 03/28/2016: Normal bilateral ABIs representing improvement compared to the prior examination.   Carotid Dopplers 03/28/2016: Occluded RICA with 1-39% LICA stenosis and normal subclavians.  Assessment and Plan:  1. Chronic systolic heart failure, symptomatically stable at this point on current medical regimen. No changes were made today.  2. Ischemic cardiomyopathy with LVEF 30-35%.  3. St. Jude ICD in place, followed by Dr. Ladona Ridgel. Recent interrogation noted.  4. 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  5. 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  Current medicines were reviewed with the patient today.  Disposition: Follow-up in 4  months.  Signed, [name], MD, [facility] 03/05/2017 11:43 AM    [facility] at [facility] [address] [place], [place], [place] [address] Phone: [phone]; Fax: [phone]

## 2017-03-05 ENCOUNTER — Other Ambulatory Visit: Payer: Self-pay | Admitting: Cardiology

## 2017-03-05 ENCOUNTER — Ambulatory Visit (INDEPENDENT_AMBULATORY_CARE_PROVIDER_SITE_OTHER): Payer: Medicare PPO | Admitting: Cardiology

## 2017-03-05 ENCOUNTER — Encounter: Payer: Self-pay | Admitting: *Deleted

## 2017-03-05 VITALS — BP 111/77 | HR 60 | Ht 66.0 in | Wt 225.0 lb

## 2017-03-05 DIAGNOSIS — Z95810 Presence of automatic (implantable) cardiac defibrillator: Secondary | ICD-10-CM

## 2017-03-05 DIAGNOSIS — D6859 Other primary thrombophilia: Secondary | ICD-10-CM

## 2017-03-05 DIAGNOSIS — I779 Disorder of arteries and arterioles, unspecified: Secondary | ICD-10-CM | POA: Diagnosis not present

## 2017-03-05 DIAGNOSIS — I5022 Chronic systolic (congestive) heart failure: Secondary | ICD-10-CM

## 2017-03-05 DIAGNOSIS — I255 Ischemic cardiomyopathy: Secondary | ICD-10-CM | POA: Diagnosis not present

## 2017-03-05 DIAGNOSIS — I739 Peripheral vascular disease, unspecified: Secondary | ICD-10-CM

## 2017-03-05 NOTE — Patient Instructions (Signed)
Your physician recommends that you schedule a follow-up appointment in: 4 MONTHS WITH DR. MCDOWELL  Your physician recommends that you continue on your current medications as directed. Please refer to the Current Medication list given to you today.  Thank you for choosing  [facility]!!    

## 2017-03-13 ENCOUNTER — Ambulatory Visit (INDEPENDENT_AMBULATORY_CARE_PROVIDER_SITE_OTHER): Payer: Medicare PPO | Admitting: *Deleted

## 2017-03-13 DIAGNOSIS — Z5181 Encounter for therapeutic drug level monitoring: Secondary | ICD-10-CM | POA: Diagnosis not present

## 2017-03-13 LAB — POCT INR: INR: 1.7

## 2017-03-27 ENCOUNTER — Ambulatory Visit (INDEPENDENT_AMBULATORY_CARE_PROVIDER_SITE_OTHER): Payer: Medicare PPO | Admitting: *Deleted

## 2017-03-27 DIAGNOSIS — Z5181 Encounter for therapeutic drug level monitoring: Secondary | ICD-10-CM

## 2017-03-27 LAB — POCT INR: INR: 3.1

## 2017-04-01 DIAGNOSIS — I25119 Atherosclerotic heart disease of native coronary artery with unspecified angina pectoris: Secondary | ICD-10-CM | POA: Diagnosis not present

## 2017-04-01 DIAGNOSIS — I509 Heart failure, unspecified: Secondary | ICD-10-CM | POA: Diagnosis not present

## 2017-04-01 DIAGNOSIS — M545 Low back pain: Secondary | ICD-10-CM | POA: Diagnosis not present

## 2017-04-04 ENCOUNTER — Other Ambulatory Visit: Payer: Self-pay | Admitting: Cardiology

## 2017-04-16 ENCOUNTER — Telehealth: Payer: Self-pay | Admitting: Cardiology

## 2017-04-16 NOTE — Telephone Encounter (Signed)
Called pt, pt states she has been on Levaquin, completed last Tuesday 04/08/17.  Pt states someone has already called pt appt made to check INR tomorrow in [place] clinic 04/17/17 at 9:20.  Pt states she will eat some extra greens this pm, advised pt not to skip tonight's dosage as planned given the fact that she completed Levaquin greater than 1 week ago.

## 2017-04-16 NOTE — Telephone Encounter (Signed)
[name] [name] called the office stating that her brother passed away last week and she was on an antibiotic . She took her last pill on 04/18/17. She is concerned about the antiobotic and coumdin. Please call [phone].

## 2017-04-16 NOTE — Telephone Encounter (Signed)
Returned a call to the pt & stated she took the antibiotic Levaquin & finished it on last Tuesday 04/08/17. Advised that the med can interfere with Coumadin causing the INR to increased. She asked if she should skip a pill since the med interacts; advised that without checking her INR I would recommend getting INR checked. Advised pt that I could schedule her for the [facility] but pt stated she would call & make her own appt. Advised pt to do so, so we can get her on the schedule & she stated she would call.

## 2017-04-17 ENCOUNTER — Ambulatory Visit (INDEPENDENT_AMBULATORY_CARE_PROVIDER_SITE_OTHER): Payer: Medicare PPO | Admitting: *Deleted

## 2017-04-17 DIAGNOSIS — M25562 Pain in left knee: Secondary | ICD-10-CM | POA: Diagnosis not present

## 2017-04-17 DIAGNOSIS — I679 Cerebrovascular disease, unspecified: Secondary | ICD-10-CM

## 2017-04-17 DIAGNOSIS — Z5181 Encounter for therapeutic drug level monitoring: Secondary | ICD-10-CM

## 2017-04-17 DIAGNOSIS — I509 Heart failure, unspecified: Secondary | ICD-10-CM | POA: Diagnosis not present

## 2017-04-17 DIAGNOSIS — M25561 Pain in right knee: Secondary | ICD-10-CM | POA: Diagnosis not present

## 2017-04-17 DIAGNOSIS — I25119 Atherosclerotic heart disease of native coronary artery with unspecified angina pectoris: Secondary | ICD-10-CM | POA: Diagnosis not present

## 2017-04-17 DIAGNOSIS — M545 Low back pain: Secondary | ICD-10-CM | POA: Diagnosis not present

## 2017-04-17 LAB — POCT INR: INR: 3.4

## 2017-05-05 ENCOUNTER — Other Ambulatory Visit (HOSPITAL_COMMUNITY): Payer: Self-pay | Admitting: Interventional Radiology

## 2017-05-05 ENCOUNTER — Telehealth (HOSPITAL_COMMUNITY): Payer: Self-pay

## 2017-05-05 NOTE — Telephone Encounter (Signed)
Called to schedule 1 yr f/u cta, no answer. AW

## 2017-05-08 ENCOUNTER — Ambulatory Visit (INDEPENDENT_AMBULATORY_CARE_PROVIDER_SITE_OTHER): Payer: Medicare PPO | Admitting: *Deleted

## 2017-05-08 ENCOUNTER — Other Ambulatory Visit: Payer: Self-pay | Admitting: Cardiology

## 2017-05-08 DIAGNOSIS — Z5181 Encounter for therapeutic drug level monitoring: Secondary | ICD-10-CM | POA: Diagnosis not present

## 2017-05-08 DIAGNOSIS — I679 Cerebrovascular disease, unspecified: Secondary | ICD-10-CM

## 2017-05-08 LAB — POCT INR: INR: 4.1

## 2017-05-14 ENCOUNTER — Telehealth: Payer: Self-pay | Admitting: Cardiology

## 2017-05-14 ENCOUNTER — Encounter: Payer: Medicare PPO | Admitting: *Deleted

## 2017-05-14 NOTE — Telephone Encounter (Signed)
Spoke with pt and reminded pt of remote transmission that is due today. Pt verbalized understanding.   

## 2017-05-15 ENCOUNTER — Encounter: Payer: Self-pay | Admitting: Cardiology

## 2017-05-22 ENCOUNTER — Ambulatory Visit (INDEPENDENT_AMBULATORY_CARE_PROVIDER_SITE_OTHER): Payer: Medicare PPO | Admitting: *Deleted

## 2017-05-22 DIAGNOSIS — Z5181 Encounter for therapeutic drug level monitoring: Secondary | ICD-10-CM

## 2017-05-22 DIAGNOSIS — I679 Cerebrovascular disease, unspecified: Secondary | ICD-10-CM | POA: Diagnosis not present

## 2017-05-22 LAB — POCT INR: INR: 3.1

## 2017-05-22 MED ORDER — WARFARIN SODIUM 10 MG PO TABS: ORAL_TABLET | ORAL | 3 refills | Status: DC

## 2017-06-11 ENCOUNTER — Other Ambulatory Visit: Payer: Self-pay | Admitting: Cardiology

## 2017-06-12 ENCOUNTER — Ambulatory Visit (INDEPENDENT_AMBULATORY_CARE_PROVIDER_SITE_OTHER): Payer: Medicare PPO | Admitting: *Deleted

## 2017-06-12 ENCOUNTER — Other Ambulatory Visit (HOSPITAL_COMMUNITY): Payer: Self-pay | Admitting: Interventional Radiology

## 2017-06-12 DIAGNOSIS — I679 Cerebrovascular disease, unspecified: Secondary | ICD-10-CM | POA: Diagnosis not present

## 2017-06-12 DIAGNOSIS — I729 Aneurysm of unspecified site: Secondary | ICD-10-CM

## 2017-06-12 DIAGNOSIS — Z5181 Encounter for therapeutic drug level monitoring: Secondary | ICD-10-CM

## 2017-06-12 LAB — POCT INR: INR: 5.9

## 2017-06-18 ENCOUNTER — Observation Stay (HOSPITAL_COMMUNITY)
Admission: EM | Admit: 2017-06-18 | Discharge: 2017-06-19 | Disposition: A | Discharge status: Home / Self Care | Payer: Medicare PPO | Attending: Pulmonary Disease | Admitting: Pulmonary Disease

## 2017-06-18 ENCOUNTER — Emergency Department (HOSPITAL_COMMUNITY): Payer: Medicare PPO

## 2017-06-18 ENCOUNTER — Encounter (HOSPITAL_COMMUNITY): Payer: Self-pay | Admitting: Emergency Medicine

## 2017-06-18 DIAGNOSIS — E871 Hypo-osmolality and hyponatremia: Secondary | ICD-10-CM | POA: Diagnosis not present

## 2017-06-18 DIAGNOSIS — Z79899 Other long term (current) drug therapy: Secondary | ICD-10-CM | POA: Insufficient documentation

## 2017-06-18 DIAGNOSIS — Z95810 Presence of automatic (implantable) cardiac defibrillator: Secondary | ICD-10-CM | POA: Diagnosis not present

## 2017-06-18 DIAGNOSIS — F17210 Nicotine dependence, cigarettes, uncomplicated: Secondary | ICD-10-CM | POA: Diagnosis not present

## 2017-06-18 DIAGNOSIS — D6861 Antiphospholipid syndrome: Secondary | ICD-10-CM | POA: Diagnosis not present

## 2017-06-18 DIAGNOSIS — I5022 Chronic systolic (congestive) heart failure: Secondary | ICD-10-CM | POA: Diagnosis not present

## 2017-06-18 DIAGNOSIS — R079 Chest pain, unspecified: Secondary | ICD-10-CM | POA: Diagnosis not present

## 2017-06-18 DIAGNOSIS — I2510 Atherosclerotic heart disease of native coronary artery without angina pectoris: Secondary | ICD-10-CM | POA: Diagnosis not present

## 2017-06-18 DIAGNOSIS — Z7901 Long term (current) use of anticoagulants: Secondary | ICD-10-CM

## 2017-06-18 DIAGNOSIS — F17200 Nicotine dependence, unspecified, uncomplicated: Secondary | ICD-10-CM | POA: Diagnosis not present

## 2017-06-18 DIAGNOSIS — I5042 Chronic combined systolic (congestive) and diastolic (congestive) heart failure: Secondary | ICD-10-CM | POA: Diagnosis present

## 2017-06-18 DIAGNOSIS — R0789 Other chest pain: Secondary | ICD-10-CM | POA: Diagnosis not present

## 2017-06-18 DIAGNOSIS — Z7982 Long term (current) use of aspirin: Secondary | ICD-10-CM | POA: Diagnosis not present

## 2017-06-18 DIAGNOSIS — R0602 Shortness of breath: Secondary | ICD-10-CM | POA: Diagnosis not present

## 2017-06-18 DIAGNOSIS — E785 Hyperlipidemia, unspecified: Secondary | ICD-10-CM

## 2017-06-18 DIAGNOSIS — E782 Mixed hyperlipidemia: Secondary | ICD-10-CM | POA: Diagnosis present

## 2017-06-18 DIAGNOSIS — I10 Essential (primary) hypertension: Secondary | ICD-10-CM | POA: Diagnosis present

## 2017-06-18 DIAGNOSIS — I110 Hypertensive heart disease with heart failure: Secondary | ICD-10-CM | POA: Diagnosis not present

## 2017-06-18 HISTORY — DX: Chronic combined systolic (congestive) and diastolic (congestive) heart failure: I50.42

## 2017-06-18 LAB — PROTIME-INR
INR: 1.26
Prothrombin Time: 15.9 seconds — ABNORMAL HIGH (ref 11.4–15.2)

## 2017-06-18 LAB — BASIC METABOLIC PANEL
Anion gap: 9 (ref 5–15)
BUN: 10 mg/dL (ref 6–20)
CO2: 25 mmol/L (ref 22–32)
Calcium: 8.5 mg/dL — ABNORMAL LOW (ref 8.9–10.3)
Chloride: 94 mmol/L — ABNORMAL LOW (ref 101–111)
Creatinine, Ser: 0.68 mg/dL (ref 0.44–1.00)
GFR calc Af Amer: >60 mL/min (ref >60)
GFR calc non Af Amer: >60 mL/min (ref >60)
Glucose, Bld: 94 mg/dL (ref 65–99)
Potassium: 3.8 mmol/L (ref 3.5–5.1)
Sodium: 128 mmol/L — ABNORMAL LOW (ref 135–145)

## 2017-06-18 LAB — CBC
HCT: 41.2 % (ref 36.0–46.0)
Hemoglobin: 13.9 g/dL (ref 12.0–15.0)
MCH: 29.6 pg (ref 26.0–34.0)
MCHC: 33.7 g/dL (ref 30.0–36.0)
MCV: 87.8 fL (ref 78.0–100.0)
Platelets: 177 10*3/uL (ref 150–400)
RBC: 4.69 MIL/uL (ref 3.87–5.11)
RDW: 13 % (ref 11.5–15.5)
WBC: 9.2 10*3/uL (ref 4.0–10.5)

## 2017-06-18 LAB — URINALYSIS, ROUTINE W REFLEX MICROSCOPIC
Bilirubin Urine: NEGATIVE
Glucose, UA: NEGATIVE mg/dL
Hgb urine dipstick: NEGATIVE
Ketones, ur: NEGATIVE mg/dL
Leukocytes, UA: NEGATIVE
Nitrite: NEGATIVE
Protein, ur: NEGATIVE mg/dL
Specific Gravity, Urine: 1.002 — ABNORMAL LOW (ref 1.005–1.030)
pH: 6 (ref 5.0–8.0)

## 2017-06-18 LAB — RAPID URINE DRUG SCREEN, HOSP PERFORMED
Amphetamines: NOT DETECTED
Barbiturates: NOT DETECTED
Benzodiazepines: NOT DETECTED
Cocaine: NOT DETECTED
Opiates: NOT DETECTED
Tetrahydrocannabinol: NOT DETECTED

## 2017-06-18 LAB — TROPONIN I
Troponin I: <0.03 ng/mL (ref <0.03)
Troponin I: <0.03 ng/mL (ref <0.03)
Troponin I: <0.03 ng/mL (ref <0.03)

## 2017-06-18 LAB — TSH: TSH: 0.626 u[IU]/mL (ref 0.350–4.500)

## 2017-06-18 MED ORDER — CLONAZEPAM 0.5 MG PO TABS
1.0000 mg | ORAL_TABLET | Freq: Four times a day (QID) | ORAL | Status: DC | PRN
  Administered 2017-06-18 – 2017-06-19 (×2): 1 mg via ORAL
  Filled 2017-06-18 (×2): qty 2

## 2017-06-18 MED ORDER — SODIUM CHLORIDE 0.9% FLUSH
3.0000 mL | Freq: Two times a day (BID) | INTRAVENOUS | Status: DC
  Administered 2017-06-18: 3 mL via INTRAVENOUS

## 2017-06-18 MED ORDER — ENOXAPARIN SODIUM 100 MG/ML ~~LOC~~ SOLN
1.0000 mg/kg | Freq: Once | SUBCUTANEOUS | Status: AC
  Administered 2017-06-18: 100 mg via SUBCUTANEOUS
  Filled 2017-06-18: qty 1

## 2017-06-18 MED ORDER — CARVEDILOL 12.5 MG PO TABS
12.5000 mg | ORAL_TABLET | Freq: Two times a day (BID) | ORAL | Status: DC
  Administered 2017-06-18 – 2017-06-19 (×2): 12.5 mg via ORAL
  Filled 2017-06-18 (×3): qty 1

## 2017-06-18 MED ORDER — SODIUM CHLORIDE 0.9 % IV SOLN: 250.0000 mL | INTRAVENOUS | Status: DC | PRN

## 2017-06-18 MED ORDER — FUROSEMIDE 80 MG PO TABS
80.0000 mg | ORAL_TABLET | Freq: Every day | ORAL | Status: DC
  Administered 2017-06-19: 80 mg via ORAL

## 2017-06-18 MED ORDER — ISOSORBIDE MONONITRATE ER 60 MG PO TB24
30.0000 mg | ORAL_TABLET | Freq: Every day | ORAL | Status: DC
  Administered 2017-06-19: 30 mg via ORAL
  Filled 2017-06-18: qty 1

## 2017-06-18 MED ORDER — HEPARIN (PORCINE) IN NACL 100-0.45 UNIT/ML-% IJ SOLN
1000.0000 [IU]/h | INTRAMUSCULAR | Status: DC
  Administered 2017-06-19: 1000 [IU]/h via INTRAVENOUS
  Filled 2017-06-18: qty 250

## 2017-06-18 MED ORDER — FAMOTIDINE 20 MG PO TABS
20.0000 mg | ORAL_TABLET | Freq: Two times a day (BID) | ORAL | Status: DC
  Administered 2017-06-18 – 2017-06-19 (×2): 20 mg via ORAL
  Filled 2017-06-18 (×2): qty 1

## 2017-06-18 MED ORDER — ACETAMINOPHEN 325 MG PO TABS
650.0000 mg | ORAL_TABLET | ORAL | Status: DC | PRN
  Administered 2017-06-18: 650 mg via ORAL
  Filled 2017-06-18: qty 2

## 2017-06-18 MED ORDER — SODIUM CHLORIDE 0.9% FLUSH: 3.0000 mL | INTRAVENOUS | Status: DC | PRN

## 2017-06-18 MED ORDER — LOSARTAN POTASSIUM 25 MG PO TABS: 12.5000 mg | ORAL_TABLET | Freq: Every day | ORAL | Status: DC

## 2017-06-18 MED ORDER — ONDANSETRON HCL 4 MG/2ML IJ SOLN: 4.0000 mg | Freq: Four times a day (QID) | INTRAMUSCULAR | Status: DC | PRN

## 2017-06-18 MED ORDER — GABAPENTIN 300 MG PO CAPS
300.0000 mg | ORAL_CAPSULE | Freq: Two times a day (BID) | ORAL | Status: DC
  Administered 2017-06-18 – 2017-06-19 (×2): 300 mg via ORAL
  Filled 2017-06-18 (×2): qty 1

## 2017-06-18 MED ORDER — ASPIRIN EC 81 MG PO TBEC
81.0000 mg | DELAYED_RELEASE_TABLET | Freq: Every day | ORAL | Status: DC
  Administered 2017-06-19: 81 mg via ORAL
  Filled 2017-06-18: qty 1

## 2017-06-18 MED ORDER — NITROGLYCERIN 2 % TD OINT
1.0000 [in_us] | TOPICAL_OINTMENT | Freq: Four times a day (QID) | TRANSDERMAL | Status: DC
  Administered 2017-06-18 – 2017-06-19 (×3): 1 [in_us] via TOPICAL
  Filled 2017-06-18 (×4): qty 1

## 2017-06-18 MED ORDER — NICOTINE 7 MG/24HR TD PT24
7.0000 mg | MEDICATED_PATCH | Freq: Every day | TRANSDERMAL | Status: DC
  Administered 2017-06-18 – 2017-06-19 (×2): 7 mg via TRANSDERMAL
  Filled 2017-06-18 (×2): qty 1

## 2017-06-18 MED ORDER — SPIRONOLACTONE 25 MG PO TABS
25.0000 mg | ORAL_TABLET | Freq: Every day | ORAL | Status: DC
  Administered 2017-06-19: 25 mg via ORAL
  Filled 2017-06-18: qty 1

## 2017-06-18 MED ORDER — DULOXETINE HCL 60 MG PO CPEP
60.0000 mg | ORAL_CAPSULE | Freq: Every morning | ORAL | Status: DC
  Administered 2017-06-19: 60 mg via ORAL
  Filled 2017-06-18: qty 1

## 2017-06-18 MED ORDER — POTASSIUM CHLORIDE CRYS ER 20 MEQ PO TBCR
20.0000 meq | EXTENDED_RELEASE_TABLET | Freq: Every day | ORAL | Status: DC
  Administered 2017-06-19: 20 meq via ORAL
  Filled 2017-06-18: qty 1

## 2017-06-18 NOTE — Progress Notes (Addendum)
ANTICOAGULATION CONSULT NOTE - Initial Consult  Pharmacy Consult for Heparin Indication: chest pain/ACS  Allergies  Allergen Reactions  . Metoclopramide Hcl Anaphylaxis    Non responsive  . Penicillins Anaphylaxis    Shock Has patient had a PCN reaction causing immediate rash, facial/tongue/throat swelling, SOB or lightheadedness with hypotension: Yes Has patient had a PCN reaction causing severe rash involving mucus membranes or skin necrosis: No Has patient had a PCN reaction that required hospitalization Yes Has patient had a PCN reaction occurring within the last 10 years: No If all of the above answers are "NO", then may proceed with Cephalosporin use.   . Metoprolol Other (See Comments)    Syncope   . Statins Other (See Comments)    Myalgias    Patient Measurements: Height: 5\' 7"  (170.2 cm) Weight: 224 lb (101.6 kg) IBW/kg (Calculated) : 61.6 HEPARIN DW (KG): 84.4  Vital Signs: Temp: 97.7 F (36.5 C) (06/20 1417) Temp Source: Oral (06/20 1417) BP: 110/70 (06/20 1530) Pulse Rate: 62 (06/20 1430)  Labs:  Recent Labs  06/18/17 1431 06/18/17 1433 06/18/17 1655  HGB 13.9  --   --   HCT 41.2  --   --   PLT 177  --   --   LABPROT  --  15.9*  --   INR  --  1.26  --   CREATININE 0.68  --   --   TROPONINI <0.03  --  <0.03    Estimated Creatinine Clearance: 98.5 mL/min (by C-G formula based on SCr of 0.68 mg/dL).   Medical History: Past Medical History:  Diagnosis Date  . Aneurysm (HCC)    internal carotid artery  . Anticardiolipin antibody positive    Chronic Coumadin  . Chronic combined systolic (congestive) and diastolic (congestive) heart failure (HCC)   . Cocaine abuse in remission   . Coronary atherosclerosis of native coronary artery    Severe two vessel disease   . Essential hypertension, benign   . Headache   . History of pneumonia   . Ischemic cardiomyopathy    LVEF 30-35%  . Mixed hyperlipidemia   . PVD (peripheral vascular disease) (HCC)    . Raynaud's phenomenon   . 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 aspirin EC 81 MG tablet Take 81 mg by mouth daily.   06/18/2017 at Unknown time  . aspirin-acetaminophen-caffeine (EXCEDRIN MIGRAINE) 250-250-65 MG tablet Take 2 tablets by mouth once as needed for headache or migraine.   06/18/2017 at Unknown time  . carvedilol (COREG) 12.5 MG tablet TAKE 1 TABLET BY MOUTH TWICE DAILY WITH MEALS 180 tablet 3 06/18/2017 at 900a  . clonazePAM (KLONOPIN) 1 MG tablet Take 1 mg by mouth 4 (four) times daily as needed for anxiety.   unknown  . DULoxetine (CYMBALTA) 60 MG capsule Take 60 mg by mouth every morning.   06/18/2017 at Unknown time  . furosemide (LASIX) 40 MG tablet TAKE 1 TABLET BY MOUTH DAILY AS NEEDED FOR SWELLING. 30 tablet 3 06/18/2017 at Unknown time  . furosemide (LASIX) 80 MG tablet TAKE 1 TABLET (80 MG TOTAL) BY MOUTH DAILY. (Patient taking differently: Take 80 mg by mouth daily. May take an extra 40 mg tablet if needed for weight gain of 3-4 lbs.) 90 tablet 3 06/18/2017 at Unknown time  . gabapentin (NEURONTIN) 300 MG capsule Take 300 mg by mouth 2 (  two) times daily.   06/18/2017 at Unknown time  . HYDROcodone-acetaminophen (NORCO) 10-325 MG per tablet Take 0.5 tablets by mouth 4 (four) times daily as needed for moderate pain or severe pain. pain   Past Week at Unknown time  . isosorbide mononitrate (IMDUR) 30 MG 24 hr tablet TAKE 1 TABLET BY MOUTH DAILY 90 tablet 3 06/18/2017 at Unknown time  . losartan (COZAAR) 25 MG tablet TAKE 1/2 OF A TABLET BY MOUTH DAILY (Patient taking differently: TAKE 1/2 OF A TABLET BY MOUTH DAILY AT BEDTIME) 45 tablet 3 06/17/2017 at Unknown time  . Multiple Vitamins-Minerals (MULTIVITAMINS THER. W/MINERALS) TABS Take 1 tablet by mouth daily.     06/18/2017 at Unknown time  . nitroGLYCERIN (NITROSTAT) 0.4 MG SL tablet PLACE 1 TABLET UNDER THE  TONGUE EVERY 5 MINUTES FOR 3 DOSES AS NEEDED CHEST PAIN 25 tablet 3 06/17/2017 at 1900  . potassium chloride SA (K-DUR,KLOR-CON) 20 MEQ tablet TAKE 1 TABLET (20 MEQ TOTAL) BY MOUTH DAILY. 90 tablet 3 06/18/2017 at Unknown time  . promethazine (PHENERGAN) 25 MG tablet Take 25 mg by mouth every 6 (six) hours as needed for nausea.   06/17/2017 at Unknown time  . ranitidine (ZANTAC) 150 MG tablet Take 150 mg by mouth 2 (two) times daily.   06/18/2017 at Unknown time  . spironolactone (ALDACTONE) 25 MG tablet TAKE 1 TABLET BY MOUTH EVERY DAY 90 tablet 3 06/18/2017 at Unknown time  . warfarin (COUMADIN) 10 MG tablet Take 1 tablet daily except 1/2 tablet on Thursdays (Patient taking differently: Take 5-10 mg by mouth See admin instructions. TAKES 5MG  ON TUESDAYS AND THURSDAYS AND TAKES 10MG  ON ALL OTHER DAYS) 30 tablet 3 Past Week at Unknown time    Assessment: 56 yo presented with chest pain. medical history significant of anticardiolipin antibody coagulopathy with h/o remote CVA, CAD, ischemic cardiomyopathy (EF 30-35% in 3/17 with grade 1 diastolic dysfunction), an ICA aneurysm, and HTN . She is chronically on coumadin but has difficulty with control lately. Coumadin was held for 4 days and not restarted until yesterday(5mg ). INR is subtherapeutic and has not taken today. Coumadin is on hold. Lovenox 1mg /kg given around 1600. Will start heparin for anticoagulation in 12 hours. Initial tropins are <0.03.  Goal of Therapy:  Heparin level 0.3-0.7 units/ml Monitor platelets by anticoagulation protocol: Yes   Plan:  Start heparin infusion at 1000 units/hr at 0400 Check anti-Xa level in 6 hours and daily while on heparin Continue to monitor H&H and platelets Pager #[phone]   Elder Cyphers, BS Pharm D, BCPS Clinical Pharmacist6/20/2018,8:22 PM

## 2017-06-18 NOTE — ED Notes (Signed)
Patient transported to X-ray 

## 2017-06-18 NOTE — ED Provider Notes (Addendum)
[facility] DEPT Provider Note   CSN: [id number] Arrival date & time: 06/18/17  1404     History   Chief Complaint Chief Complaint  Patient presents with  . Shortness of Breath    HPI [name] [name] is a 56 y.o. female.  HPI Pt started having pain in her chest last night.  She had to take a nitro.  Today when she woke up she had a discomfort in the front of her chest.  It feels like a band across the top of her chest.  Something just doesn't feel right.  The discomfort is still present.  No fevers.  No cough.  Some pain in her left arm.  She also has tingling in her extremities and dizziness.  Pt started to smoke again but she quit and is vaping.  No hx of stents.  Past Medical History:  Diagnosis Date  . Anticardiolipin antibody positive    Chronic Coumadin  . CHF (congestive heart failure) (HCC)   . Chronic systolic heart failure (HCC)   . Cocaine abuse in remission   . Coronary atherosclerosis of native coronary artery    Severe two vessel disease   . Essential hypertension, benign   . Headache   . History of pneumonia   . Ischemic cardiomyopathy    LVEF 30-35%  . Mixed hyperlipidemia   . PVD (peripheral vascular disease) (HCC)   . Raynaud's phenomenon   . 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 Left-sided weakness   . Acute on chronic systolic heart failure (HCC) 07/26/2015  . Encounter for therapeutic drug monitoring 02/02/2014  . Peripheral arterial disease (HCC) 11/09/2013  . Dual implantable cardioverter-defibrillator in situ 06/01/2012  . Long term current use of anticoagulant 03/22/2011  . Essential hypertension, benign 05/29/2010  . CHEST PAIN 05/29/2010  . HYPERLIPIDEMIA 09/26/2009  . Peripheral vascular disease (HCC) 06/23/2009  . Cardiomyopathy, ischemic 11/29/2008    . CEREBROVASCULAR DISEASE 11/29/2008  . ANTICARDIOLIPIN ANTIBODY SYNDROME 11/29/2008    Past Surgical History:  Procedure Laterality Date  . ABDOMINAL AORTAGRAM N/A 05/25/2014   Procedure: ABDOMINAL Ronny Flurry;  Surgeon: Iran Ouch, MD;  Location: [facility] CATH LAB;  Service: Cardiovascular;  Laterality: N/A;  . APPENDECTOMY    . CARDIAC DEFIBRILLATOR PLACEMENT     [employer] ICD  . EP IMPLANTABLE DEVICE N/A 03/01/2016   Procedure: ICD Generator Changeout;  Surgeon: [name], MD;  Location: [facility] INVASIVE CV LAB;  Service: Cardiovascular;  Laterality: N/A;  . L1 corpectomy   12/10  . Laryngeal polyp excision      OB History    No data available       Home Medications    Prior to Admission medications   Medication Sig Start Date End Date Taking? Authorizing Provider  aspirin EC 81 MG tablet Take 81 mg by mouth daily.    [provider]  B Complex-Folic Acid (B COMPLEX-VITAMIN B12 PO) Take 1 tablet by mouth daily.     [provider]  carvedilol (COREG) 12.5 MG tablet TAKE 1 TABLET BY MOUTH TWICE DAILY WITH MEALS 03/05/17   [name], MD  cetirizine (ZYRTEC) 10 MG tablet Take 10 mg by mouth daily as needed.     [provider]  citalopram (CELEXA) 20 MG tablet Take 40 mg  by mouth daily.     [provider]  Coenzyme Q10 (CO Q 10 PO) Take 200 mg by mouth daily.     [provider]  docusate sodium (COLACE) 100 MG capsule Take 100 mg by mouth daily as needed for mild constipation.     [provider]  furosemide (LASIX) 40 MG tablet TAKE 1 TABLET BY MOUTH DAILY AS NEEDED FOR SWELLING. 04/04/17   [name], MD  furosemide (LASIX) 80 MG tablet TAKE 1 TABLET (80 MG TOTAL) BY MOUTH DAILY. Patient taking differently: Take 80 mg by mouth daily. May take an extra 40 mg tablet if needed for weight gain of 3-4 lbs. 08/06/16   [name], MD  gabapentin (NEURONTIN) 300 MG capsule Take 300 mg by mouth 2 (two) times daily.     [provider]  guaiFENesin (MUCINEX) 600 MG 12 hr tablet Take 600 mg by mouth 2 (two) times daily as needed for cough or to loosen phlegm. Reported on 02/29/2016    [provider]  HYDROcodone-acetaminophen (NORCO) 10-325 MG per tablet Take 0.5 tablets by mouth 4 (four) times daily as needed for moderate pain or severe pain. pain    [provider]  ibuprofen (ADVIL,MOTRIN) 200 MG tablet Take 400 mg by mouth every 6 (six) hours as needed (PAIN). pain    [provider]  isosorbide mononitrate (IMDUR) 30 MG 24 hr tablet TAKE 1 TABLET BY MOUTH DAILY 02/04/17   [name], MD  LORazepam (ATIVAN) 1 MG tablet Take 1 tablet by mouth 4 (four) times daily as needed. 02/17/17   [provider]  losartan (COZAAR) 25 MG tablet TAKE 1/2 OF A TABLET BY MOUTH DAILY 05/08/17   [name], MD  Multiple Vitamins-Minerals (MULTIVITAMINS THER. W/MINERALS) TABS Take 1 tablet by mouth daily.      [provider]  nitroGLYCERIN (NITROSTAT) 0.4 MG SL tablet PLACE 1 TABLET UNDER THE TONGUE EVERY 5 MINUTES FOR 3 DOSES AS NEEDED CHEST PAIN 06/11/17   [name], MD  potassium chloride SA (K-DUR,KLOR-CON) 20 MEQ tablet TAKE 1 TABLET (20 MEQ TOTAL) BY MOUTH DAILY. 08/07/16   [name], MD  promethazine (PHENERGAN) 25 MG tablet Take 25 mg by mouth every 6 (six) hours as needed for nausea.    [provider]  ranitidine (ZANTAC) 150 MG tablet Take 150 mg by mouth 2 (two) times daily.    [provider]  spironolactone (ALDACTONE) 25 MG tablet TAKE 1 TABLET BY MOUTH EVERY DAY 05/08/17   [name], MD  warfarin (COUMADIN) 10 MG tablet Take 1 tablet daily except 1/2 tablet on Thursdays 05/22/17   [name], MD    Family History Family History  Problem Relation Age of Onset  . Heart disease Father   . Stomach cancer Brother   . Heart attack Brother     Social History Social History  Substance Use Topics  .  Smoking status: Former Smoker    Packs/day: 1.00    Years: 43.00    Types: E-cigarettes    Start date: 03/03/1978    Quit date: 10/03/2013  . Smokeless tobacco: Never Used     Comment: still using e-cig - at 0.2mg  now, will go to 0 soon.    . Alcohol use No     Allergies   Penicillins; Metoclopramide hcl; Metoprolol; and Statins   Review of Systems Review of Systems  All other systems reviewed and are negative.  Physical Exam Updated Vital Signs BP 116/71   Pulse 62   Temp 97.7 F (36.5 C) (Oral)   Resp 13   Ht 1.702 m (5\' 7" )   Wt 101.6 kg (224 lb)   SpO2 95%   BMI 35.08 kg/m   Physical Exam  HENT:  Head: Normocephalic and atraumatic.  Right Ear: External ear normal.  Left Ear: External ear normal.  Eyes: Conjunctivae are normal. Right eye exhibits no discharge. Left eye exhibits no discharge. No scleral icterus.  Neck: Neck supple. No tracheal deviation present.  Cardiovascular: Normal rate, regular rhythm and intact distal pulses.   Pulmonary/Chest: Effort normal and breath sounds normal. No stridor. No respiratory distress. She has no wheezes. She has no rales.  Abdominal: Soft. Bowel sounds are normal. She exhibits no distension. There is no tenderness. There is no rebound and no guarding.  Musculoskeletal: She exhibits no edema or tenderness.  Neurological: She is alert. She has normal strength. No cranial nerve deficit (no facial droop, extraocular movements intact, no slurred speech) or sensory deficit. She exhibits normal muscle tone. She displays no seizure activity. Coordination normal.  Skin: Skin is warm and dry. No rash noted. She is not diaphoretic.  Psychiatric: She has a normal mood and affect.  Nursing note and vitals reviewed.    ED Treatments / Results  Labs (all labs ordered are listed, but only abnormal results are displayed) Labs Reviewed  BASIC METABOLIC PANEL - Abnormal; Notable for the following:       Result Value   Sodium 128 (*)      Chloride 94 (*)    Calcium 8.5 (*)    All other components within normal limits  PROTIME-INR - Abnormal; Notable for the following:    Prothrombin Time 15.9 (*)    All other components within normal limits  URINALYSIS, ROUTINE W REFLEX MICROSCOPIC - Abnormal; Notable for the following:    Color, Urine COLORLESS (*)    Specific Gravity, Urine 1.002 (*)    All other components within normal limits  CBC  TROPONIN I    EKG  EKG Interpretation  Date/Time:  Wednesday June 18 2017 14:12:44 EDT Ventricular Rate:  67 PR Interval:  184 QRS Duration: 114 QT Interval:  418 QTC Calculation: 441 R Axis:   19 Text Interpretation:  Normal sinus rhythm Low voltage QRS Septal infarct , age undetermined Abnormal ECG No significant change since last tracing Confirmed by Linwood Dibbles [phone]) on 06/18/2017 2:32:07 PM       Radiology Dg Chest 2 View  Result Date: 06/18/2017 CLINICAL DATA:  Chest pain short of breath EXAM: CHEST  2 VIEW COMPARISON:  03/01/2015 FINDINGS: Cardiac enlargement without heart failure. AICD unchanged in position. Lungs clear without infiltrate or effusion Corpectomy and fusion L1 IMPRESSION: No active cardiopulmonary disease. Electronically Signed   By: Marlan Palau M.D.   On: 06/18/2017 14:56    Procedures Procedures (including critical care time)  Medications Ordered in ED Medications  enoxaparin (LOVENOX) injection 100 mg (not administered)  nitroGLYCERIN (NITROGLYN) 2 % ointment 1 inch (not administered)     Initial Impression / Assessment and Plan / ED Course  I have reviewed the triage vital signs and the nursing notes.  Pertinent labs & imaging results that were available during my care of the patient were reviewed by me and considered in my medical decision making (see chart for details).  Clinical Course as of Jun 18 1528  Wed Jun 18, 2017  1523 INR  is subtherapeutic.  Pt states she was told to hold her coumadin and increase her leafy green intake  because of elevated inr recently.  [JK]  66 Reviewed old records.  Unable to see cardiac cath report in the records.    [JK]    Clinical Course User Index [JK] Linwood Dibbles, MD    Patient presents to emergency room with anginal type symptoms. She has history of multiple medical problems.    Heart score is 5, moderate risk.  Initial laboratory tests are reassuring with the exception of her subtherapeutic INR. I doubt pulmonary embolism.Marland Kitchen Her symptoms more concerning for angina. Considering her risk factors and her symptoms I will consult with medical service for admission and further evaluation  Final Clinical Impressions(s) / ED Diagnoses   Final diagnoses:  Chest pain, unspecified type      Linwood Dibbles, MD 06/18/17 1638    Linwood Dibbles, MD 09/11/17 1622

## 2017-06-18 NOTE — H&P (Addendum)
History and Physical    [name] [name] VWU:[id number] DOB: 1961/01/26 DOA: 06/18/2017  PCP: Kari Baars, MD Consultants:  Diona Browner - cardiology; Deveschwar - interventional radiology; Ladona Ridgel - EP Patient coming from: Home - lives alone; NOK: nephew, [phone]  Chief Complaint: chest pain  HPI: [name] [name] is a 56 y.o. 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  She was late to class and when she arrived she noticed left-sided chest pain which resolved with NTG.  She also had fleeting stabbing pain in her upper back.  She has these pains periodically, occasionally awakens with them, so she was not overly concerned about it.  She arrived home and was fine but vomited about 4 hours later.  Since awakening this AM, and noted difficulty catching her breath (thought it was the heat).  Also with tightness across her neck "like I had a dog collar on", arms and feet felt tingly, felt light-headed, SOB.  Symptoms are not exertional.  Currently feels much better with the NTG patch on - the tightness in her throat has resolved.  These were her index symptoms with MI in the past.  Has h/o CHF - last Echo was in 3/17.  Cannot remember when her last stress test was, by Epic it appears to have been 01/2013.    She has been on Coumadin 2 mg daily for years with good control.  The last 3 months it has been "kind of out of whack."  Her INR was 5.9 last week and then Coumadin was held for 4 days and she ate a ton of greens; restarted with 5 mg Coumadin Tuesday (yesterday), takes it at night and has not taken it yet today.   ED Course:  Heart score 5, reassuring labs other than subtherapeutic INR.  Symptoms concerning for angina.  Review of Systems: As per HPI; otherwise review  of systems reviewed and negative.   Ambulatory Status:  Ambulates without assistance  Past Medical History:  Diagnosis Date  . Aneurysm (HCC)    internal carotid artery  . Anticardiolipin antibody positive    Chronic Coumadin  . Chronic combined systolic (congestive) and diastolic (congestive) heart failure (HCC)   . Cocaine abuse in remission   . Coronary atherosclerosis of native coronary artery    Severe two vessel disease   . Essential hypertension, benign   . Headache   . History of pneumonia   . Ischemic cardiomyopathy    LVEF 30-35%  . Mixed hyperlipidemia   . PVD (peripheral vascular disease) (HCC)   . Raynaud's phenomenon   . Stroke [facility]) 2007   Total occlusion of the right internal carotid    Past Surgical History:  Procedure Laterality Date  . ABDOMINAL AORTAGRAM N/A 05/25/2014   Procedure: ABDOMINAL Ronny Flurry;  Surgeon: Iran Ouch, MD;  Location: [facility] CATH LAB;  Service: Cardiovascular;  Laterality: N/A;  . APPENDECTOMY    . CARDIAC DEFIBRILLATOR PLACEMENT     St.Jude ICD  . EP IMPLANTABLE DEVICE N/A 03/01/2016   Procedure: ICD Generator Changeout;  Surgeon: [name], MD;  Location: [facility] INVASIVE CV LAB;  Service: Cardiovascular;  Laterality: N/A;  . L1 corpectomy   12/10  . Laryngeal polyp excision      Social History   Social History  . Marital  status: Single    Spouse name: N/A  . Number of children: N/A  . Years of education: N/A   Occupational History  . Disabled     [employer]   Social History Main Topics  . Smoking status: Current Every Day Smoker    Packs/day: 1.00    Years: 43.00    Types: E-cigarettes    Start date: 03/03/1978    Last attempt to quit: 10/03/2013  . Smokeless tobacco: Never Used     Comment: resumed cigarettes, stopped last week but continues to vape  . Alcohol use No  . Drug use: No     Comment: Prior history of cocaine  . Sexual activity: Yes    Birth control/ protection: None   Other Topics Concern  . Not on file     Social History Narrative  . No narrative on file    Allergies  Allergen Reactions  . Metoclopramide Hcl Anaphylaxis    Non responsive  . Penicillins Anaphylaxis    Shock Has patient had a PCN reaction causing immediate rash, facial/tongue/throat swelling, SOB or lightheadedness with hypotension: Yes Has patient had a PCN reaction causing severe rash involving mucus membranes or skin necrosis: No Has patient had a PCN reaction that required hospitalization Yes Has patient had a PCN reaction occurring within the last 10 years: No If all of the above answers are "NO", then may proceed with Cephalosporin use.   . Metoprolol Other (See Comments)    Syncope   . Statins Other (See Comments)    Myalgias    Family History  Problem Relation Age of Onset  . Heart disease Father 64  . Ankylosing spondylitis Sister   . Stomach cancer Brother 65  . Heart attack Brother     Prior to Admission medications   Medication Sig Start Date End Date Taking? Authorizing Provider  aspirin EC 81 MG tablet Take 81 mg by mouth daily.   Yes [provider]  aspirin-acetaminophen-caffeine (EXCEDRIN MIGRAINE) [phone] MG tablet Take 2 tablets by mouth once as needed for headache or migraine.   Yes [provider]  carvedilol (COREG) 12.5 MG tablet TAKE 1 TABLET BY MOUTH TWICE DAILY WITH MEALS 03/05/17  Yes [name], MD  clonazePAM (KLONOPIN) 1 MG tablet Take 1 mg by mouth 4 (four) times daily as needed for anxiety.   Yes [provider]  DULoxetine (CYMBALTA) 60 MG capsule Take 60 mg by mouth every morning.   Yes [provider]  furosemide (LASIX) 40 MG tablet TAKE 1 TABLET BY MOUTH DAILY AS NEEDED FOR SWELLING. 04/04/17  Yes [name], MD  furosemide (LASIX) 80 MG tablet TAKE 1 TABLET (80 MG TOTAL) BY MOUTH DAILY. Patient taking differently: Take 80 mg by mouth daily. May take an extra 40 mg tablet if needed for weight gain of 3-4 lbs. 08/06/16  Yes  [name], MD  gabapentin (NEURONTIN) 300 MG capsule Take 300 mg by mouth 2 (two) times daily.   Yes [provider]  HYDROcodone-acetaminophen (NORCO) 10-325 MG per tablet Take 0.5 tablets by mouth 4 (four) times daily as needed for moderate pain or severe pain. pain   Yes [provider]  isosorbide mononitrate (IMDUR) 30 MG 24 hr tablet TAKE 1 TABLET BY MOUTH DAILY 02/04/17  Yes [name], MD  losartan (COZAAR) 25 MG tablet TAKE 1/2 OF A TABLET BY MOUTH DAILY Patient taking differently: TAKE 1/2 OF A TABLET BY MOUTH DAILY AT BEDTIME 05/08/17  Yes [name], MD  Multiple Vitamins-Minerals (MULTIVITAMINS THER. W/MINERALS) TABS Take 1 tablet by mouth daily.     Yes [provider]  nitroGLYCERIN (NITROSTAT) 0.4 MG SL tablet PLACE 1 TABLET UNDER THE TONGUE EVERY 5 MINUTES FOR 3 DOSES AS NEEDED CHEST PAIN 06/11/17  Yes [name], MD  potassium chloride SA (K-DUR,KLOR-CON) 20 MEQ tablet TAKE 1 TABLET (20 MEQ TOTAL) BY MOUTH DAILY. 08/07/16  Yes [name], MD  promethazine (PHENERGAN) 25 MG tablet Take 25 mg by mouth every 6 (six) hours as needed for nausea.   Yes [provider]  ranitidine (ZANTAC) 150 MG tablet Take 150 mg by mouth 2 (two) times daily.   Yes [provider]  spironolactone (ALDACTONE) 25 MG tablet TAKE 1 TABLET BY MOUTH EVERY DAY 05/08/17  Yes [name], MD  warfarin (COUMADIN) 10 MG tablet Take 1 tablet daily except 1/2 tablet on Thursdays Patient taking differently: Take 5-10 mg by mouth See admin instructions. TAKES 5MG  ON TUESDAYS AND THURSDAYS AND TAKES 10MG  ON ALL OTHER DAYS 05/22/17  Yes [name], MD    Physical Exam: Vitals:   06/18/17 1418 06/18/17 1430 06/18/17 1500 06/18/17 1530  BP:  134/82 116/71 110/70  Pulse:  62    Resp:  15 13 13   Temp:      TempSrc:      SpO2:  95%    Weight: 101.6 kg (224 lb)     Height: 5\' 7"  (1.702 m)        General: Appears calm and  comfortable and is NAD Eyes:  PERRL, EOMI, normal lids, iris ENT:  grossly normal hearing, lips & tongue, mmm Neck:  no LAD, masses or thyromegaly Cardiovascular:  RRR, no m/r/g. No LE edema.  Respiratory:  CTA bilaterally, no w/r/r. Normal respiratory effort. Abdomen:  soft, ntnd, NABS Skin:  no rash or induration seen on limited exam Musculoskeletal:  grossly normal tone BUE/BLE, good ROM, no bony abnormality Psychiatric:  grossly normal mood and affect, speech fluent and appropriate, AOx3.  She did become emotionally labile when we discussed staying at [facility] vs. Transfer to [facility].  She discussed 3 recent deaths in the family and her life stressors. Neurologic:  CN 2-12 grossly intact, moves all extremities in coordinated fashion, sensation intact  Labs on Admission: I have personally reviewed following labs and imaging studies  CBC:  Recent Labs Lab 06/18/17 1431  WBC 9.2  HGB 13.9  HCT 41.2  MCV 87.8  PLT 177   Basic Metabolic Panel:  Recent Labs Lab 06/18/17 1431  NA 128*  K 3.8  CL 94*  CO2 25  GLUCOSE 94  BUN 10  CREATININE 0.68  CALCIUM 8.5*   GFR: Estimated Creatinine Clearance: 98.5 mL/min (by C-G formula based on SCr of 0.68 mg/dL). Liver Function Tests: No results for input(s): AST, ALT, ALKPHOS, BILITOT, PROT, ALBUMIN in the last 168 hours. No results for input(s): LIPASE, AMYLASE in the last 168 hours. No results for input(s): AMMONIA in the last 168 hours. Coagulation Profile:  Recent Labs Lab 06/12/17 1105 06/18/17 1433  INR 5.9 1.26   Cardiac Enzymes:  Recent Labs Lab 06/18/17 1431 06/18/17 1655  TROPONINI <0.03 <0.03   BNP (last 3 results) No results for input(s): PROBNP in the last 8760 hours. HbA1C: No results for input(s): HGBA1C in the last 72 hours. CBG: No results for input(s): GLUCAP in the last 168 hours. Lipid Profile: No results for input(s): CHOL, HDL,  LDLCALC, TRIG, CHOLHDL, LDLDIRECT in the last 72 hours. Thyroid Function  Tests: No results for input(s): TSH, T4TOTAL, FREET4, T3FREE, THYROIDAB in the last 72 hours. Anemia Panel: No results for input(s): VITAMINB12, FOLATE, FERRITIN, TIBC, IRON, RETICCTPCT in the last 72 hours. Urine analysis:    Component Value Date/Time   COLORURINE COLORLESS (A) 06/18/2017 1441   APPEARANCEUR CLEAR 06/18/2017 1441   LABSPEC 1.002 (L) 06/18/2017 1441   PHURINE 6.0 06/18/2017 1441   GLUCOSEU NEGATIVE 06/18/2017 1441   HGBUR NEGATIVE 06/18/2017 1441   BILIRUBINUR NEGATIVE 06/18/2017 1441   KETONESUR NEGATIVE 06/18/2017 1441   PROTEINUR NEGATIVE 06/18/2017 1441   UROBILINOGEN 0.2 12/23/2011 1440   NITRITE NEGATIVE 06/18/2017 1441   LEUKOCYTESUR NEGATIVE 06/18/2017 1441    Creatinine Clearance: Estimated Creatinine Clearance: 98.5 mL/min (by C-G formula based on SCr of 0.68 mg/dL).  Sepsis Labs: @LABRCNTIP (procalcitonin:4,lacticidven:4) )No results found for this or any previous visit (from the past 240 hour(s)).   Radiological Exams on Admission: Dg Chest 2 View  Result Date: 06/18/2017 CLINICAL DATA:  Chest pain short of breath EXAM: CHEST  2 VIEW COMPARISON:  03/01/2015 FINDINGS: Cardiac enlargement without heart failure. AICD unchanged in position. Lungs clear without infiltrate or effusion Corpectomy and fusion L1 IMPRESSION: No active cardiopulmonary disease. Electronically Signed   By: Marlan Palau M.D.   On: 06/18/2017 14:56    EKG: Independently reviewed.  NSR with rate 67; low voltage, nonspecific ST changes with no evidence of acute ischemia; NSCSLT  Assessment/Plan Principal Problem:   Chest pain Active Problems:   Hyperlipidemia   Essential hypertension, benign   Anticardiolipin syndrome (HCC)   Long term current use of anticoagulant   Dual implantable cardioverter-defibrillator in situ   Chronic combined systolic (congestive) and diastolic (congestive) heart failure (HCC)   Hyponatremia   Tobacco dependence   Chest pain -Patient with  left-sided chest pain/upper back pain/neck tightness similar to her prior index symptoms that came on last night and today intermittently, non-exertional, relieved with NTG. -2/3 typical symptoms suggestive of atypical cardiac chest pain.  -CXR unremarkable.   -Initial cardiac troponin negative x 2 -EKG not indicative of acute ischemia.   -GRACE score, which does not take into account her clotting disorder, is 112; which predicts an in-hospital death rate of 1.2%.  -Will plan to place in observation status on telemetry to rule out ACS by overnight observation.  -cycle troponin q6h x 3 and repeat EKG in AM -Start Heparin drip (see below). -morphine given -Risk factor stratification with FLP; will also check TSH and UDS -Cardiology consultation in AM - NPO for possible stress test -Will repeat Echo (see below)  -We discussed staying at [facility] vs. Transfer to [facility] and the risks/benefits of each.  She became very emotionally labile at the idea of transfer and was very uncomfortable with that plan.  She strongly prefers to stay at [facility] with Drs. [name] and Diona Browner and would only be willing to transfer to [facility] if she deteriorated clinically or was determined to need a cardiac catheterization. -Continue home meds - ASA, BB, Imdur, ARB. -Continue Nitropaste, as this resolved her pain.  HTN -Takes Coreg, Cozaar at home -Patient with good control while in the ER  HLD -Lipid panel not available in Epic -She reports an allergy of "myalgias" to statins. -For now, will not start.  However, she is very likely to benefit from statin therapy and ongoing encouragement to continue to try medication should be considered.  CHF -Last Echo was in 3/17  and was improved with EF 30-35% (prior 15) and with grade 1 diastolic dysfunction -Has AICD -Will order repeat Echo in AM -Appears to be compensated at this time. -Continue home Lasix and Aldactone to maintain volume status.  Anticardiolipin syndrome, on  anticoagulation -INR 1.26; 5.9 on 6/14; >3 since 3/29 -With high risk for clotting (as evidenced by prior issues despite young age), will start Heparin drip -I considered continuing Coumadin to try to achieve a therapeutic level but decided that this would not be prudent until it is clear that she will not need intervention; holding Coumadin at this time. -Check daily INR (she did receive a 5 mg dose of Coumadin last night and so it still may increase tomorrow)  Hyponatremia -Na++ 128, prior 132 on 10/12 -Likely related to Lasix/Aldactone -She is asymptomatic -Not further evaluation/treatment at this time  Tobacco Dependence -She continues to smoke cigarettes intermittently and also vapes. -We discussed the potential dangers of vaping; that this is still considered tobacco use; and that she should attempt to quit this too. -Encourage cessation.  This was discussed with the patient and should be reviewed on an ongoing basis.   -Patch ordered.    DVT prophylaxis: Heparin drip Code Status:  Full - confirmed with patient/family Family Communication: Nephew present throughout evaluation  Disposition Plan:  Home once clinically improved Consults called: Cardiology consult entered for tomorrow  Admission status: It is my clinical opinion that referral for OBSERVATION is reasonable and necessary in this patient based on the above information provided. The aforementioned taken together are felt to place the patient at high risk for further clinical deterioration. However it is anticipated that the patient may be medically stable for discharge from the hospital within 24 to 48 hours.    Jonah Blue MD [facility]  If 7PM-7AM, please contact night-coverage [email or web address] Password [facility]  06/18/2017, 7:37 PM

## 2017-06-18 NOTE — ED Triage Notes (Signed)
Pt c/o of lower back, neck pain and SOB that started last night with N/V.  Pt has defibrillator and pacemaker. Pt states " I feel like I have something around my neck that makes me short of breath". HX of MI.

## 2017-06-19 ENCOUNTER — Observation Stay (HOSPITAL_BASED_OUTPATIENT_CLINIC_OR_DEPARTMENT_OTHER): Payer: Medicare PPO

## 2017-06-19 ENCOUNTER — Encounter (HOSPITAL_COMMUNITY): Payer: Self-pay | Admitting: Adult Health

## 2017-06-19 DIAGNOSIS — R0789 Other chest pain: Secondary | ICD-10-CM

## 2017-06-19 DIAGNOSIS — I259 Chronic ischemic heart disease, unspecified: Secondary | ICD-10-CM | POA: Diagnosis not present

## 2017-06-19 DIAGNOSIS — I340 Nonrheumatic mitral (valve) insufficiency: Secondary | ICD-10-CM | POA: Diagnosis not present

## 2017-06-19 DIAGNOSIS — I2510 Atherosclerotic heart disease of native coronary artery without angina pectoris: Secondary | ICD-10-CM | POA: Diagnosis not present

## 2017-06-19 DIAGNOSIS — R079 Chest pain, unspecified: Secondary | ICD-10-CM

## 2017-06-19 LAB — BASIC METABOLIC PANEL
Anion gap: 9 (ref 5–15)
BUN: 13 mg/dL (ref 6–20)
CO2: 26 mmol/L (ref 22–32)
Calcium: 8.5 mg/dL — ABNORMAL LOW (ref 8.9–10.3)
Chloride: 100 mmol/L — ABNORMAL LOW (ref 101–111)
Creatinine, Ser: 0.69 mg/dL (ref 0.44–1.00)
GFR calc Af Amer: >60 mL/min (ref >60)
GFR calc non Af Amer: >60 mL/min (ref >60)
Glucose, Bld: 100 mg/dL — ABNORMAL HIGH (ref 65–99)
Potassium: 3.3 mmol/L — ABNORMAL LOW (ref 3.5–5.1)
Sodium: 135 mmol/L (ref 135–145)

## 2017-06-19 LAB — NM MYOCAR MULTI W/SPECT W/WALL MOTION / EF
LV dias vol: 234 mL (ref 46–106)
LV sys vol: 196 mL
Peak HR: 82 {beats}/min
RATE: 0.27
Rest HR: 59 {beats}/min
SDS: 0
SRS: 34
SSS: 34
TID: 1.05

## 2017-06-19 LAB — ECHOCARDIOGRAM COMPLETE
E decel time: 246 msec
E/e' ratio: 13.53
FS: 19 % — AB (ref 28–44)
Height: 67 in
IVS/LV PW RATIO, ED: 1.27
LA ID, A-P, ES: 58 mm
LA diam end sys: 58 mm
LA diam index: 2.6 cm/m2
LA vol A4C: 81.7 ml
LA vol index: 39.1 mL/m2
LA vol: 87.3 mL
LV E/e' medial: 13.53
LV E/e'average: 13.53
LV PW d: 8.75 mm — AB (ref 0.6–1.1)
LV dias vol index: 79 mL/m2
LV dias vol: 176 mL — AB (ref 46–106)
LV e' LATERAL: 6.2 cm/s
LV sys vol index: 55 mL/m2
LV sys vol: 122 mL — AB (ref 14–42)
LVOT SV: 83 mL
LVOT VTI: 24 cm
LVOT area: 3.46 cm2
LVOT diameter: 21 mm
LVOT peak grad rest: 4 mmHg
LVOT peak vel: 106 cm/s
Lateral S' vel: 10.9 cm/s
MV Dec: 246
MV Peak grad: 3 mmHg
MV pk A vel: 115 m/s
MV pk E vel: 83.9 m/s
RV sys press: 30 mmHg
Reg peak vel: 237 cm/s
Simpson's disk: 31
Stroke v: 54 ml
TAPSE: 16 mm
TDI e' lateral: 6.2
TDI e' medial: 4.24
TR max vel: 237 cm/s
Weight: 3584 oz

## 2017-06-19 LAB — CBC
HCT: 39.1 % (ref 36.0–46.0)
Hemoglobin: 13.1 g/dL (ref 12.0–15.0)
MCH: 29.7 pg (ref 26.0–34.0)
MCHC: 33.5 g/dL (ref 30.0–36.0)
MCV: 88.7 fL (ref 78.0–100.0)
Platelets: 142 10*3/uL — ABNORMAL LOW (ref 150–400)
RBC: 4.41 MIL/uL (ref 3.87–5.11)
RDW: 12.9 % (ref 11.5–15.5)
WBC: 6.9 10*3/uL (ref 4.0–10.5)

## 2017-06-19 LAB — LIPID PANEL
Cholesterol: 147 mg/dL (ref 0–200)
HDL: 31 mg/dL — ABNORMAL LOW (ref >40)
LDL Cholesterol: 97 mg/dL (ref 0–99)
Total CHOL/HDL Ratio: 4.7 RATIO
Triglycerides: 97 mg/dL (ref <150)
VLDL: 19 mg/dL (ref 0–40)

## 2017-06-19 LAB — PROTIME-INR
INR: 1.3
Prothrombin Time: 16.3 seconds — ABNORMAL HIGH (ref 11.4–15.2)

## 2017-06-19 LAB — TROPONIN I: Troponin I: <0.03 ng/mL (ref <0.03)

## 2017-06-19 MED ORDER — NICOTINE 7 MG/24HR TD PT24: 7.0000 mg | MEDICATED_PATCH | Freq: Every day | TRANSDERMAL | 0 refills | Status: DC

## 2017-06-19 MED ORDER — TECHNETIUM TC 99M TETROFOSMIN IV KIT
10.0000 | PACK | Freq: Once | INTRAVENOUS | Status: AC | PRN
  Administered 2017-06-19: 11 via INTRAVENOUS

## 2017-06-19 MED ORDER — SODIUM CHLORIDE 0.9% FLUSH
INTRAVENOUS | Status: AC
  Administered 2017-06-19: 10 mL via INTRAVENOUS
  Filled 2017-06-19: qty 10

## 2017-06-19 MED ORDER — REGADENOSON 0.4 MG/5ML IV SOLN
INTRAVENOUS | Status: AC
  Administered 2017-06-19: 0.4 mg via INTRAVENOUS
  Filled 2017-06-19: qty 5

## 2017-06-19 MED ORDER — REGADENOSON 0.4 MG/5ML IV SOLN
0.4000 mg | Freq: Once | INTRAVENOUS | Status: AC
  Administered 2017-06-19: 0.4 mg via INTRAVENOUS
  Filled 2017-06-19: qty 5

## 2017-06-19 MED ORDER — TECHNETIUM TC 99M TETROFOSMIN IV KIT
30.0000 | PACK | Freq: Once | INTRAVENOUS | Status: AC | PRN
  Administered 2017-06-19: 32 via INTRAVENOUS

## 2017-06-19 NOTE — Progress Notes (Signed)
Patient upset about OBS status and wanting to leave AMA - signed paper and IV DC'd.  When getting ready, experiencing the same tightness again.  Now states she will stay and have stress test done.  Refusing IV and tele. Just wants to have the test done. Dr. [name] made aware.

## 2017-06-19 NOTE — Care Management Obs Status (Signed)
MEDICARE OBSERVATION STATUS NOTIFICATION   Patient Details  Name: [name] [name] MRN: [id number] Date of Birth: 04-Jan-1961   Medicare Observation Status Notification Given:  Yes    Malcolm Metro, RN 06/19/2017, 9:31 AM

## 2017-06-19 NOTE — Progress Notes (Signed)
*  PRELIMINARY RESULTS* Echocardiogram 2D Echocardiogram has been performed.  Stacey Drain 06/19/2017, 3:29 PM

## 2017-06-19 NOTE — Consult Note (Addendum)
Cardiology Consultation:   Patient ID: [name] [name]; [id number]; 14-Jan-1961   Admit date: 06/18/2017 Date of Consult: 06/19/2017  Primary Care Provider: Kari Baars, MD Primary Cardiologist: Diona Browner Primary Electrophysiologist:  Ladona Ridgel    Patient Profile:   [name] [name] is a 56 y.o. 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 [name].  History of Present Illness:   [name] [name] 56 year old female with of of mentioned cardiac issues, with multiple medical issues, who presented to the emergency room with nausea, weakness, dyspnea, with significant pain in her throat and neck described as a tight collar. She describes this pain very similar to the pain she had with prior MI. This occurred at rest. She took a nitroglycerin at home when this occurred and symptoms resolved one day ago but when symptoms returned and were persistent, and more severe she presented to the emergency room.   Blood pressure on arrival 107/67 heart rate 69 O2 sat 98% she was afebrile. Troponin is rebound been negative 3. PT 15.9, INR 1.26 (she had had extremely elevated INR per Coumadin clinic over the last couple of weeks greater than 5, Coumadin dosing has been adjusted). She restarted Coumadin 5 mg on 06/17/2017. Chest x-ray are as negative for cardiopulmonary disease. EKG reveals sinus rhythm with intraventricular conduction delay, HR 67 bpm.  She was treated with nitroglycerin topically 1 inch, and started on LMWH weight heparin. Since admission the patient has had no recurrence of symptoms. She is, we'll now,  very talkative, once to go home.   Past Medical History:  Diagnosis Date  . Aneurysm (HCC)    internal carotid artery  . Anticardiolipin antibody positive    Chronic Coumadin  . Chronic combined systolic (congestive) and diastolic (congestive) heart failure (HCC)   . Cocaine abuse in remission   . Coronary atherosclerosis of native coronary artery    Severe two vessel disease   . Essential hypertension, benign   . Headache   . History of pneumonia   . Ischemic cardiomyopathy    LVEF 30-35%  . Mixed hyperlipidemia   . PVD (peripheral vascular disease) (HCC)   . Raynaud's phenomenon   . Stroke [facility]) 2007   Total occlusion of the right internal carotid    Past Surgical History:  Procedure Laterality Date  . ABDOMINAL AORTAGRAM N/A 05/25/2014   Procedure: ABDOMINAL Ronny Flurry;  Surgeon: Iran Ouch, MD;  Location: [facility] CATH LAB;  Service: Cardiovascular;  Laterality: N/A;  . APPENDECTOMY    . CARDIAC DEFIBRILLATOR PLACEMENT     St.Jude ICD  . EP IMPLANTABLE DEVICE N/A 03/01/2016   Procedure: ICD Generator Changeout;  Surgeon: [name], MD;  Location: [facility] INVASIVE CV LAB;  Service: Cardiovascular;  Laterality: N/A;  . L1 corpectomy   12/10  . Laryngeal polyp excision       Inpatient Medications: Scheduled Meds: . aspirin EC  81 mg Oral Daily  . carvedilol  12.5 mg Oral BID WC  . DULoxetine  60 mg Oral q morning - 10a  . famotidine  20 mg Oral BID  . furosemide  80 mg Oral  Daily  . gabapentin  300 mg Oral BID  . isosorbide mononitrate  30 mg Oral Daily  . losartan  12.5 mg Oral QHS  . nicotine  7 mg Transdermal Daily  . nitroGLYCERIN  1 inch Topical Q6H  . potassium chloride SA  20 mEq Oral Daily  . sodium chloride flush  3 mL Intravenous Q12H  . spironolactone  25 mg Oral Daily   Continuous Infusions: . sodium chloride    . heparin 1,000 Units/hr (06/19/17 0407)   PRN Meds: sodium chloride, acetaminophen, clonazePAM, ondansetron (ZOFRAN) IV, sodium chloride  flush  Allergies:    Allergies  Allergen Reactions  . Metoclopramide Hcl Anaphylaxis    Non responsive  . Penicillins Anaphylaxis    Shock Has patient had a PCN reaction causing immediate rash, facial/tongue/throat swelling, SOB or lightheadedness with hypotension: Yes Has patient had a PCN reaction causing severe rash involving mucus membranes or skin necrosis: No Has patient had a PCN reaction that required hospitalization Yes Has patient had a PCN reaction occurring within the last 10 years: No If all of the above answers are "NO", then may proceed with Cephalosporin use.   . Metoprolol Other (See Comments)    Syncope   . Statins Other (See Comments)    Myalgias    Social History:   Social History   Social History  . Marital status: Single    Spouse name: N/A  . Number of children: N/A  . Years of education: N/A   Occupational History  . Disabled     [employer]   Social History Main Topics  . Smoking status: Current Every Day Smoker    Packs/day: 1.00    Years: 43.00    Types: E-cigarettes    Start date: 03/03/1978    Last attempt to quit: 10/03/2013  . Smokeless tobacco: Never Used     Comment: resumed cigarettes, stopped last week but continues to vape  . Alcohol use No  . Drug use: No     Comment: Prior history of cocaine  . Sexual activity: Yes    Birth control/ protection: None   Other Topics Concern  . Not on file   Social History Narrative  . No narrative on file    Family History:   The patient's family history includes Ankylosing spondylitis in her sister; Heart attack in her brother; Heart disease (age of onset: 13) in her father; Stomach cancer (age of onset: 49) in her brother.  ROS:  Please see the history of present illness.  ROS All other ROS reviewed and negative.     Physical Exam/Data:   Vitals:   06/18/17 1530 06/18/17 2201 06/19/17 0632 06/19/17 0745  BP: 110/70 111/66 125/75 126/67  Pulse:  62 62 60  Resp: 13 18 18 16   Temp:  97.7 F  (36.5 C) 98.3 F (36.8 C) 98.1 F (36.7 C)  TempSrc:  Oral Oral Oral  SpO2:  97% 98% 99%  Weight:      Height:        Intake/Output Summary (Last 24 hours) at 06/19/17 0902 Last data filed at 06/19/17 0600  Gross per 24 hour  Intake            18.83 ml  Output                0 ml  Net            18.83 ml   Filed Weights   06/18/17 1418  Weight: 224  lb (101.6 kg)   Body mass index is 35.08 kg/m.  General:  Well nourished, well developed, in no acute distress HEENT: normal Lymph: no adenopathy Neck: no JVD Endocrine:  No thryomegaly Vascular: No carotid bruits; FA pulses 2+ bilaterally without bruits  Cardiac:  normal S1, S2; RRR; no murmur, distant heart sounds  Lungs:  clear to auscultation bilaterally, no wheezing, rhonchi or rales  Abd: soft, nontender, no hepatomegaly  Ext: no edema Musculoskeletal:  No deformities, BUE and BLE strength normal and equal Skin: warm and dry  Neuro:  CNs 2-12 intact, no focal abnormalities noted Psych:  Normal affect    EKG:  The EKG was personally reviewed and demonstrates sinus rhythm, intraventricular conduction delay, heart of 67 bpm   Relevant CV Studies: 02/02/2013 IMPRESSION: Abnormal pharmacologic stress nuclear myocardial study revealing a large area of myocardial infarction as described, substantial left ventricular dilatation and moderate to severe left ventricular dysfunction in a segmental pattern.  No ischemia apparent.  03/13/2017 Left ventricle: Wall thickness was increased in a pattern of mild   LVH. Systolic function was moderately to severely reduced. The   estimated ejection fraction was in the range of 30% to 35%.   Diffuse hypokinesis. Doppler parameters are consistent with   abnormal left ventricular relaxation (grade 1 diastolic   dysfunction). - Aortic valve: Valve area (VTI): 3.64 cm^2. Valve area (Vmax):   3.27 cm^2. Valve area (Vmean): 2.91 cm^2. - Mitral valve: There was mild regurgitation. - Left  atrium: The atrium was severely dilated.  ICD Check 02/13/2017 ICD check in clinic. Normal device function. Thresholds and sensing consistent with previous device measurements. Impedance trends stable over time. 5 NSVT episodes. 1 VF with successful shock - previously reviewed. No mode switches. Histogram  distribution appropriate for patient and level of activity. No changes made this session. Device programmed at appropriate safety margins. Device programmed to optimize intrinsic conduction. Estimated longevity 10.45yrs. Pt enrolled in remote follow-up.   Cardiac Cath (unknown date, from office noted dated 10/25/2008, but added to EPIC on 04/30/2011). Per Dr. Ival Bible note:  "Previous invasive cardiac testing was done at a facility in [place], [place].  These records are placed in the back of the chart and have  already been reviewed.  She was noted to have an occluded diagonal  Avraj Lindroth with diffuse left anterior descending disease and an occluded  circumflex with no obstructive disease in the right coronary artery.  This was all felt to be best managed medically."  Laboratory Data:  Chemistry Recent Labs Lab 06/18/17 1431 06/19/17 0226  NA 128* 135  K 3.8 3.3*  CL 94* 100*  CO2 25 26  GLUCOSE 94 100*  BUN 10 13  CREATININE 0.68 0.69  CALCIUM 8.5* 8.5*  GFRNONAA >60 >60  GFRAA >60 >60  ANIONGAP 9 9    No results for input(s): PROT, ALBUMIN, AST, ALT, ALKPHOS, BILITOT in the last 168 hours. Hematology Recent Labs Lab 06/18/17 1431 06/19/17 0226  WBC 9.2 6.9  RBC 4.69 4.41  HGB 13.9 13.1  HCT 41.2 39.1  MCV 87.8 88.7  MCH 29.6 29.7  MCHC 33.7 33.5  RDW 13.0 12.9  PLT 177 142*   Cardiac Enzymes Recent Labs Lab 06/18/17 1431 06/18/17 1655 06/18/17 2027 06/19/17 0226  TROPONINI <0.03 <0.03 <0.03 <0.03   No results for input(s): TROPIPOC in the last 168 hours.  BNPNo results for input(s): BNP, PROBNP in the last 168 hours.  DDimer No results for  input(s): DDIMER in the last 168 hours.  Radiology/Studies:  Dg Chest 2 View  Result Date: 06/18/2017 CLINICAL DATA:  Chest pain short of breath EXAM: CHEST  2 VIEW COMPARISON:  03/01/2015 FINDINGS: Cardiac enlargement without heart failure. AICD unchanged in position. Lungs clear without infiltrate or effusion Corpectomy and fusion L1 IMPRESSION: No active cardiopulmonary disease. Electronically Signed   By: Marlan Palau M.D.   On: 06/18/2017 14:56    Assessment and Plan:   1.Throat Pain: Similar to symptoms of angina with prior MI. Noted to have a2 vessel disease in the past. She has had relieft of symptoms with NTG paste.  Troponin is normal. Due to symptoms suggestive of angina for this patient, will plan Steffanie Dunn for evaluation of new area's of ischemia. Per most recent documentation of stress test at [facility] in [place], 03/2015 showed a mild cardial perfusion image with no active ischemia with fixed defects in the anterolateral and apical regions EF 16%.Hold NTG.   2. Severe 2 vessel disease: Per note above.  occluded diagonal Abubakr Wieman with diffuse left anterior descending disease and an occluded circumflex with no obstructive disease in the right coronary artery. Medical management. Continue coreg, ASA,intoerant to statins.    3.  Anticardiolipin Antibody: Continue coumadin per pharmacy. She has had elevated INR as outpatient with coumadin dose adjustment, just started back on coumadin 2 days ago, after INR >5. She did eat a lot of green vegetables as well.   4, Ischemic Cardiomyopathy:  No evidence of decompensated CHF at present. Will continue carvedilol, lasix, ARB, ASA. Spironolactone. Potassium this am 3.3. She is on potassium replacement. Will follow as she is on po potassium and spironolactone.   5. Carotid Artery Disease: Total occlusion of RICA. Continue medical therapy. Hx of prior CVA.  6. ICD in situ: St Jude. Remote checks per protocol.Last documentation in  01/2017, but see phone note stating the she would be checked remotely on 05/14/2017. No reported ICD firing per patient.   7.  Hypertension: Controlled. Not optimal for current EF as documented per echo 02/2017. Will follow. May need medication titration.    Signed, [name], NP  06/19/2017 9:02 AM   Patient seen and discussed with DNP Lyman Bishop, I agree with her documentation above. 56 yo female history of ICM with LVEF 30-35%, AICD, anticardiolipin Ab + on coumadin, history of CVA admitted with chest pain. There is mention of a history of severe 2 vessel CAD, I am not able to find the cath report or further details.   Na 128, Cr 0.68, K 3.8, Hgb 13.9, Plt 177, TSH 0.626,  Trop neg x 3 CXR no acute process EKG SR, anteroseptal Qwaves, old lateral nonspecific ST/T changes Echo 02/2016 LVEF 30-35%, diffuse hypokinesis, grade I diastolic dysfunction  Patient with history of CAD admitted with chest pain. Thus far no evidence of ACS by enzymes or EKG. We will plan for a lexiscan today to further evaluate.  Continue medical therapy for her ICM with ASA 81, coreg 12.5mg  bid, losartan 12.5mg , Imdur 30mg , aldactone 25mg  daily.   Subtherapeutic INR on admission. Would hold coumadin at this time in case cath is needed, she is on heparin drip. If normal stress would restart coumadin. She is not on coumadin for a specific cardiac reason at this time, thus I would defer any decision for bridging to primary team at discharge.    Dina Rich MD

## 2017-06-19 NOTE — Progress Notes (Signed)
Subjective: She says she feels better. She's not having any chest pain now. Her troponins are negative so far. However the discomfort that she had is similar to what she had when she has had previous cardiac events.  Objective: Vital signs in last 24 hours: Temp:  [97.7 F (36.5 C)-98.3 F (36.8 C)] 98.1 F (36.7 C) (06/21 0745) Pulse Rate:  [60-69] 60 (06/21 0745) Resp:  [13-24] 16 (06/21 0745) BP: (107-134)/(66-82) 126/67 (06/21 0745) SpO2:  [95 %-99 %] 99 % (06/21 0745) Weight:  [101.6 kg (224 lb)] 101.6 kg (224 lb) (06/20 1418) Weight change:  Last BM Date: 06/18/17  Intake/Output from previous day: 06/20 0701 - 06/21 0700 In: 18.8 [I.V.:18.8] Out: -   PHYSICAL EXAM General appearance: alert, cooperative and no distress Resp: clear to auscultation bilaterally Cardio: regular rate and rhythm, S1, S2 normal, no murmur, click, rub or gallop GI: soft, non-tender; bowel sounds normal; no masses,  no organomegaly Extremities: extremities normal, atraumatic, no cyanosis or edema Skin warm and dry. Mucous membranes are moist  Lab Results:  Results for orders placed or performed during the hospital encounter of 06/18/17 (from the past 48 hour(s))  Basic metabolic panel     Status: Abnormal   Collection Time: 06/18/17  2:31 PM  Result Value Ref Range   Sodium 128 (L) 135 - 145 mmol/L   Potassium 3.8 3.5 - 5.1 mmol/L   Chloride 94 (L) 101 - 111 mmol/L   CO2 25 22 - 32 mmol/L   Glucose, Bld 94 65 - 99 mg/dL   BUN 10 6 - 20 mg/dL   Creatinine, Ser 0.68 0.44 - 1.00 mg/dL   Calcium 8.5 (L) 8.9 - 10.3 mg/dL   GFR calc non Af Amer >60 >60 mL/min   GFR calc Af Amer >60 >60 mL/min    Comment: (NOTE) The eGFR has been calculated using the CKD EPI equation. This calculation has not been validated in all clinical situations. eGFR's persistently <60 mL/min signify possible Chronic Kidney Disease.    Anion gap 9 5 - 15  CBC     Status: None   Collection Time: 06/18/17  2:31 PM   Result Value Ref Range   WBC 9.2 4.0 - 10.5 K/uL   RBC 4.69 3.87 - 5.11 MIL/uL   Hemoglobin 13.9 12.0 - 15.0 g/dL   HCT 41.2 36.0 - 46.0 %   MCV 87.8 78.0 - 100.0 fL   MCH 29.6 26.0 - 34.0 pg   MCHC 33.7 30.0 - 36.0 g/dL   RDW 13.0 11.5 - 15.5 %   Platelets 177 150 - 400 K/uL  Troponin I     Status: None   Collection Time: 06/18/17  2:31 PM  Result Value Ref Range   Troponin I <0.03 <0.03 ng/mL  Protime-INR     Status: Abnormal   Collection Time: 06/18/17  2:33 PM  Result Value Ref Range   Prothrombin Time 15.9 (H) 11.4 - 15.2 seconds   INR 1.26   Urinalysis, Routine w reflex microscopic     Status: Abnormal   Collection Time: 06/18/17  2:41 PM  Result Value Ref Range   Color, Urine COLORLESS (A) YELLOW   APPearance CLEAR CLEAR   Specific Gravity, Urine 1.002 (L) 1.005 - 1.030   pH 6.0 5.0 - 8.0   Glucose, UA NEGATIVE NEGATIVE mg/dL   Hgb urine dipstick NEGATIVE NEGATIVE   Bilirubin Urine NEGATIVE NEGATIVE   Ketones, ur NEGATIVE NEGATIVE mg/dL   Protein, ur NEGATIVE NEGATIVE  mg/dL   Nitrite NEGATIVE NEGATIVE   Leukocytes, UA NEGATIVE NEGATIVE  Urine rapid drug screen (hosp performed)     Status: None   Collection Time: 06/18/17  2:41 PM  Result Value Ref Range   Opiates NONE DETECTED NONE DETECTED   Cocaine NONE DETECTED NONE DETECTED   Benzodiazepines NONE DETECTED NONE DETECTED   Amphetamines NONE DETECTED NONE DETECTED   Tetrahydrocannabinol NONE DETECTED NONE DETECTED   Barbiturates NONE DETECTED NONE DETECTED    Comment:        DRUG SCREEN FOR MEDICAL PURPOSES ONLY.  IF CONFIRMATION IS NEEDED FOR ANY PURPOSE, NOTIFY LAB WITHIN 5 DAYS.        LOWEST DETECTABLE LIMITS FOR URINE DRUG SCREEN Drug Class       Cutoff (ng/mL) Amphetamine      1000 Barbiturate      200 Benzodiazepine   782 Tricyclics       956 Opiates          300 Cocaine          300 THC              50   Troponin I     Status: None   Collection Time: 06/18/17  4:55 PM  Result Value Ref  Range   Troponin I <0.03 <0.03 ng/mL  TSH     Status: None   Collection Time: 06/18/17  4:55 PM  Result Value Ref Range   TSH 0.626 0.350 - 4.500 uIU/mL    Comment: Performed by a 3rd Generation assay with a functional sensitivity of <=0.01 uIU/mL.  Troponin I     Status: None   Collection Time: 06/18/17  8:27 PM  Result Value Ref Range   Troponin I <0.03 <0.03 ng/mL  CBC     Status: Abnormal   Collection Time: 06/19/17  2:26 AM  Result Value Ref Range   WBC 6.9 4.0 - 10.5 K/uL   RBC 4.41 3.87 - 5.11 MIL/uL   Hemoglobin 13.1 12.0 - 15.0 g/dL   HCT 39.1 36.0 - 46.0 %   MCV 88.7 78.0 - 100.0 fL   MCH 29.7 26.0 - 34.0 pg   MCHC 33.5 30.0 - 36.0 g/dL   RDW 12.9 11.5 - 15.5 %   Platelets 142 (L) 150 - 400 K/uL  Troponin I     Status: None   Collection Time: 06/19/17  2:26 AM  Result Value Ref Range   Troponin I <0.03 <0.03 ng/mL  Basic metabolic panel     Status: Abnormal   Collection Time: 06/19/17  2:26 AM  Result Value Ref Range   Sodium 135 135 - 145 mmol/L    Comment: DELTA CHECK NOTED   Potassium 3.3 (L) 3.5 - 5.1 mmol/L   Chloride 100 (L) 101 - 111 mmol/L   CO2 26 22 - 32 mmol/L   Glucose, Bld 100 (H) 65 - 99 mg/dL   BUN 13 6 - 20 mg/dL   Creatinine, Ser 0.69 0.44 - 1.00 mg/dL   Calcium 8.5 (L) 8.9 - 10.3 mg/dL   GFR calc non Af Amer >60 >60 mL/min   GFR calc Af Amer >60 >60 mL/min    Comment: (NOTE) The eGFR has been calculated using the CKD EPI equation. This calculation has not been validated in all clinical situations. eGFR's persistently <60 mL/min signify possible Chronic Kidney Disease.    Anion gap 9 5 - 15  Lipid panel     Status: Abnormal   Collection  Time: 06/19/17  2:26 AM  Result Value Ref Range   Cholesterol 147 0 - 200 mg/dL   Triglycerides 97 <150 mg/dL   HDL 31 (L) >40 mg/dL   Total CHOL/HDL Ratio 4.7 RATIO   VLDL 19 0 - 40 mg/dL   LDL Cholesterol 97 0 - 99 mg/dL    Comment:        Total Cholesterol/HDL:CHD Risk Coronary Heart Disease Risk  Table                     Men   Women  1/2 Average Risk   3.4   3.3  Average Risk       5.0   4.4  2 X Average Risk   9.6   7.1  3 X Average Risk  23.4   11.0        Use the calculated Patient Ratio above and the CHD Risk Table to determine the patient's CHD Risk.        ATP III CLASSIFICATION (LDL):  <100     mg/dL   Optimal  100-129  mg/dL   Near or Above                    Optimal  130-159  mg/dL   Borderline  160-189  mg/dL   High  >190     mg/dL   Very High   Protime-INR     Status: Abnormal   Collection Time: 06/19/17  2:26 AM  Result Value Ref Range   Prothrombin Time 16.3 (H) 11.4 - 15.2 seconds   INR 1.30     ABGS No results for input(s): PHART, PO2ART, TCO2, HCO3 in the last 72 hours.  Invalid input(s): PCO2 CULTURES No results found for this or any previous visit (from the past 240 hour(s)). Studies/Results: Dg Chest 2 View  Result Date: 06/18/2017 CLINICAL DATA:  Chest pain short of breath EXAM: CHEST  2 VIEW COMPARISON:  03/01/2015 FINDINGS: Cardiac enlargement without heart failure. AICD unchanged in position. Lungs clear without infiltrate or effusion Corpectomy and fusion L1 IMPRESSION: No active cardiopulmonary disease. Electronically Signed   By: Franchot Gallo M.D.   On: 06/18/2017 14:56    Medications:  Prior to Admission:  Prescriptions Prior to Admission  Medication Sig Dispense Refill Last Dose  . aspirin EC 81 MG tablet Take 81 mg by mouth daily.   06/18/2017 at Unknown time  . aspirin-acetaminophen-caffeine (EXCEDRIN MIGRAINE) 250-250-65 MG tablet Take 2 tablets by mouth once as needed for headache or migraine.   06/18/2017 at Unknown time  . carvedilol (COREG) 12.5 MG tablet TAKE 1 TABLET BY MOUTH TWICE DAILY WITH MEALS 180 tablet 3 06/18/2017 at [facility]  . clonazePAM (KLONOPIN) 1 MG tablet Take 1 mg by mouth 4 (four) times daily as needed for anxiety.   unknown  . DULoxetine (CYMBALTA) 60 MG capsule Take 60 mg by mouth every morning.   06/18/2017 at  Unknown time  . furosemide (LASIX) 40 MG tablet TAKE 1 TABLET BY MOUTH DAILY AS NEEDED FOR SWELLING. 30 tablet 3 06/18/2017 at Unknown time  . furosemide (LASIX) 80 MG tablet TAKE 1 TABLET (80 MG TOTAL) BY MOUTH DAILY. (Patient taking differently: Take 80 mg by mouth daily. May take an extra 40 mg tablet if needed for weight gain of 3-4 lbs.) 90 tablet 3 06/18/2017 at Unknown time  . gabapentin (NEURONTIN) 300 MG capsule Take 300 mg by mouth 2 (two) times daily.   06/18/2017  at Unknown time  . HYDROcodone-acetaminophen (NORCO) 10-325 MG per tablet Take 0.5 tablets by mouth 4 (four) times daily as needed for moderate pain or severe pain. pain   Past Week at Unknown time  . isosorbide mononitrate (IMDUR) 30 MG 24 hr tablet TAKE 1 TABLET BY MOUTH DAILY 90 tablet 3 06/18/2017 at Unknown time  . losartan (COZAAR) 25 MG tablet TAKE 1/2 OF A TABLET BY MOUTH DAILY (Patient taking differently: TAKE 1/2 OF A TABLET BY MOUTH DAILY AT BEDTIME) 45 tablet 3 06/17/2017 at Unknown time  . Multiple Vitamins-Minerals (MULTIVITAMINS THER. W/MINERALS) TABS Take 1 tablet by mouth daily.     06/18/2017 at Unknown time  . nitroGLYCERIN (NITROSTAT) 0.4 MG SL tablet PLACE 1 TABLET UNDER THE TONGUE EVERY 5 MINUTES FOR 3 DOSES AS NEEDED CHEST PAIN 25 tablet 3 06/17/2017 at 1900  . potassium chloride SA (K-DUR,KLOR-CON) 20 MEQ tablet TAKE 1 TABLET (20 MEQ TOTAL) BY MOUTH DAILY. 90 tablet 3 06/18/2017 at Unknown time  . promethazine (PHENERGAN) 25 MG tablet Take 25 mg by mouth every 6 (six) hours as needed for nausea.   06/17/2017 at Unknown time  . ranitidine (ZANTAC) 150 MG tablet Take 150 mg by mouth 2 (two) times daily.   06/18/2017 at Unknown time  . spironolactone (ALDACTONE) 25 MG tablet TAKE 1 TABLET BY MOUTH EVERY DAY 90 tablet 3 06/18/2017 at Unknown time  . warfarin (COUMADIN) 10 MG tablet Take 1 tablet daily except 1/2 tablet on Thursdays (Patient taking differently: Take 5-10 mg by mouth See admin instructions. TAKES 5MG ON  TUESDAYS AND THURSDAYS AND TAKES 10MG ON ALL OTHER DAYS) 30 tablet 3 Past Week at Unknown time   Scheduled: . aspirin EC  81 mg Oral Daily  . carvedilol  12.5 mg Oral BID WC  . DULoxetine  60 mg Oral q morning - 10a  . famotidine  20 mg Oral BID  . furosemide  80 mg Oral Daily  . gabapentin  300 mg Oral BID  . isosorbide mononitrate  30 mg Oral Daily  . losartan  12.5 mg Oral QHS  . nicotine  7 mg Transdermal Daily  . nitroGLYCERIN  1 inch Topical Q6H  . potassium chloride SA  20 mEq Oral Daily  . sodium chloride flush  3 mL Intravenous Q12H  . spironolactone  25 mg Oral Daily   Continuous: . sodium chloride    . heparin 1,000 Units/hr (06/19/17 0407)   YYF:RTMYTR chloride, acetaminophen, clonazePAM, ondansetron (ZOFRAN) IV, sodium chloride flush  Assesment: She has chest pain. She is better now. 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 She says it's been several years since she had a nuclear stress test. She does not appear to be having acute coronary syndrome now with negative troponins but her symptoms are similar to what she's had in the past. Principal Problem:   Chest pain Active Problems:   Hyperlipidemia   Essential hypertension, benign   Anticardiolipin syndrome ([place])   Long term current use of anticoagulant   Dual implantable cardioverter-defibrillator in situ   Chronic combined systolic (congestive) and diastolic (congestive) heart failure ([place])   Hyponatremia   Tobacco dependence    Plan: Cardiology consultation potential discharge later today depending on cardiology recommendations    LOS: 0 days   Dillie Burandt L 06/19/2017, 8:15 AM

## 2017-06-19 NOTE — Progress Notes (Signed)
Patient DC'd home.  IV removed - WNL.  Reviewed Dc instructions and meds.  Encouraged to continue to stop smoking.  Follow up apt in place with cardio.  No questions at this time.  Assisted off unit in NAD.

## 2017-06-20 LAB — HIV ANTIBODY (ROUTINE TESTING W REFLEX): HIV Screen 4th Generation wRfx: NONREACTIVE

## 2017-06-20 NOTE — Discharge Summary (Signed)
Physician Discharge Summary  Patient ID: [name] [name] MRN: [id number] DOB/AGE: Jan 19, 1961 56 y.o. Primary Care Physician:Taytem Ghattas, Ramon Dredge, MD Admit date: 06/18/2017 Discharge date: 06/20/2017    Discharge Diagnoses:   Principal Problem:   Chest pain Active Problems:   Hyperlipidemia   Essential hypertension, benign   Anticardiolipin syndrome (H[facility])   Long term current use of anticoagulant   Dual implantable cardioverter-defibrillator in situ   Chronic combined systolic (congestive) and diastolic (congestive) heart failure (H[facility])   Hyponatremia   Tobacco dependence   Allergies as of 06/19/2017      Reactions   Metoclopramide Hcl Anaphylaxis   Non responsive   Penicillins Anaphylaxis   Shock Has patient had a PCN reaction causing immediate rash, facial/tongue/throat swelling, SOB or lightheadedness with hypotension: Yes Has patient had a PCN reaction causing severe rash involving mucus membranes or skin necrosis: No Has patient had a PCN reaction that required hospitalization Yes Has patient had a PCN reaction occurring within the last 10 years: No If all of the above answers are "NO", then may proceed with Cephalosporin use.   Metoprolol Other (See Comments)   Syncope   Statins Other (See Comments)   Myalgias      Medication List    TAKE these medications   aspirin EC 81 MG tablet Take 81 mg by mouth daily.   aspirin-acetaminophen-caffeine 250-250-65 MG tablet Commonly known as:  EXCEDRIN MIGRAINE Take 2 tablets by mouth once as needed for headache or migraine.   carvedilol 12.5 MG tablet Commonly known as:  COREG TAKE 1 TABLET BY MOUTH TWICE DAILY WITH MEALS   clonazePAM 1 MG tablet Commonly known as:  KLONOPIN Take 1 mg by mouth 4 (four) times daily as needed for anxiety.   DULoxetine 60 MG capsule Commonly known as:  CYMBALTA Take 60 mg by mouth every morning.   furosemide 80 MG tablet Commonly known as:  LASIX TAKE 1 TABLET (80 MG TOTAL) BY MOUTH  DAILY. What changed:  additional instructions   furosemide 40 MG tablet Commonly known as:  LASIX TAKE 1 TABLET BY MOUTH DAILY AS NEEDED FOR SWELLING. What changed:  Another medication with the same name was changed. Make sure you understand how and when to take each.   gabapentin 300 MG capsule Commonly known as:  NEURONTIN Take 300 mg by mouth 2 (two) times daily.   HYDROcodone-acetaminophen 10-325 MG tablet Commonly known as:  NORCO Take 0.5 tablets by mouth 4 (four) times daily as needed for moderate pain or severe pain. pain   isosorbide mononitrate 30 MG 24 hr tablet Commonly known as:  IMDUR TAKE 1 TABLET BY MOUTH DAILY   losartan 25 MG tablet Commonly known as:  COZAAR TAKE 1/2 OF A TABLET BY MOUTH DAILY What changed:  See the new instructions.   multivitamins ther. w/minerals Tabs tablet Take 1 tablet by mouth daily.   nicotine 7 mg/24hr patch Commonly known as:  NICODERM CQ - dosed in mg/24 hr Place 1 patch (7 mg total) onto the skin daily.   nitroGLYCERIN 0.4 MG SL tablet Commonly known as:  NITROSTAT PLACE 1 TABLET UNDER THE TONGUE EVERY 5 MINUTES FOR 3 DOSES AS NEEDED CHEST PAIN   potassium chloride SA 20 MEQ tablet Commonly known as:  K-DUR,KLOR-CON TAKE 1 TABLET (20 MEQ TOTAL) BY MOUTH DAILY.   promethazine 25 MG tablet Commonly known as:  PHENERGAN Take 25 mg by mouth every 6 (six) hours as needed for nausea.   ranitidine 150 MG tablet Commonly  known as:  ZANTAC Take 150 mg by mouth 2 (two) times daily.   spironolactone 25 MG tablet Commonly known as:  ALDACTONE TAKE 1 TABLET BY MOUTH EVERY DAY   warfarin 10 MG tablet Commonly known as:  COUMADIN Take 1 tablet daily except 1/2 tablet on Thursdays What changed:  how much to take  how to take this  when to take this  additional instructions       Discharged Condition:Improved    Consults: Cardiology  Significant Diagnostic Studies: Dg Chest 2 View  Result Date:  06/18/2017 CLINICAL DATA:  Chest pain short of breath EXAM: CHEST  2 VIEW COMPARISON:  03/01/2015 FINDINGS: Cardiac enlargement without heart failure. AICD unchanged in position. Lungs clear without infiltrate or effusion Corpectomy and fusion L1 IMPRESSION: No active cardiopulmonary disease. Electronically Signed   By: Marlan Palau M.D.   On: 06/18/2017 14:56   Nm Myocar Multi W/spect W/wall Motion / Ef  Result Date: 06/19/2017  Electrically non diagnostic for ischemia  Large defect in the anterior, anterolateral, anteroseptal, inferolateral, and apical distribution consistent with scar and some probable soft tissua attenuation.  LVEF 16%  High risk scan.  Findings consistent with prior myocardial infarction.  This is a high risk study.     Lab Results: Basic Metabolic Panel:  Recent Labs  96/04/[birth year] 1431 06/19/17 0226  NA 128* 135  K 3.8 3.3*  CL 94* 100*  CO2 25 26  GLUCOSE 94 100*  BUN 10 13  CREATININE 0.68 0.69  CALCIUM 8.5* 8.5*   Liver Function Tests: No results for input(s): AST, ALT, ALKPHOS, BILITOT, PROT, ALBUMIN in the last 72 hours.   CBC:  Recent Labs  06/18/17 1431 06/19/17 0226  WBC 9.2 6.9  HGB 13.9 13.1  HCT 41.2 39.1  MCV 87.8 88.7  PLT 177 142*    No results found for this or any previous visit (from the past 240 hour(s)).   Hospital Course: This is a 56 year old who has multiple vascular problems including history of stroke history of coronary artery occlusive disease ventricular tachycardia she is status post defibrillator placement. She came to the hospital because she had chest discomfort. There were typical and atypical features. She ruled out for acute coronary syndrome. She had been on heparin. Cardiology consultation was obtained and it was felt that she needed nuclear stress testing which was done. Her nuclear stress test was low risk. She was discharged home after the stress test.  Discharge Exam: Blood pressure 135/67, pulse 72,  temperature 97.7 F (36.5 C), temperature source Oral, resp. rate 18, height 5\' 7"  (1.702 m), weight 101.6 kg (224 lb), SpO2 98 %. She is awake and alert. Chest is clear. Heart is regular  Disposition: Home    Follow-up Information    [name], MD Follow up on 07/03/2017.   Specialty:  Cardiology Why:  9:20  Contact information: [address] [place] [address] [phone]           Signed: Fredirick Maudlin   06/20/2017, 8:53 AM

## 2017-06-24 ENCOUNTER — Ambulatory Visit (INDEPENDENT_AMBULATORY_CARE_PROVIDER_SITE_OTHER): Payer: Medicare PPO | Admitting: *Deleted

## 2017-06-24 DIAGNOSIS — D6861 Antiphospholipid syndrome: Secondary | ICD-10-CM

## 2017-06-24 DIAGNOSIS — Z5181 Encounter for therapeutic drug level monitoring: Secondary | ICD-10-CM | POA: Diagnosis not present

## 2017-06-24 DIAGNOSIS — I679 Cerebrovascular disease, unspecified: Secondary | ICD-10-CM

## 2017-06-24 LAB — POCT INR: INR: 2.5

## 2017-07-01 NOTE — Progress Notes (Signed)
Cardiology Office Note  Date: 07/03/2017   ID: [name] [name], DOB [birth year], MRN [id number]  PCP: Kari Baars, MD  Primary Cardiologist: Nona Dell, MD   Chief Complaint  Patient presents with  . Cardiomyopathy    History of Present Illness: [name] [name] is a 56 y.o. female last seen in March. Records indicate recent hospitalization at [facility] in late June with chest pain, no ACS by troponin I levels. She was seen in consultation by Dr. Wyline Mood and underwent a Myoview study which showed no ischemic territories but a fairly large area of anterior, anterolateral, anteroseptal, inferolateral, and apical scar. LVEF was calculated at 16% however by echocardiogram was stable at 30%. She was managed medically.  She presents today for follow-up, states that she has been doing well, no angina symptoms. She feels like she may have overdone it when she had the symptoms back in June, has been outside in the hot weather, dog sitting as well.  She remains on Coumadin with follow-up in the anticoagulation clinic. No reported bleeding problems.  She follows in the device clinic with Dr. Ladona Ridgel, [facility]. Jude ICD in place. No device shocks.  Blood pressure low normal, no dizziness or syncope. Current medications include aspirin, Coreg, Lasix, potassium, Imdur, Cozaar, Aldactone, and Coumadin.  Past Medical History:  Diagnosis Date  . Aneurysm (HCC)    internal carotid artery  . Anticardiolipin antibody positive    Chronic Coumadin  . Chronic combined systolic (congestive) and diastolic (congestive) heart failure (HCC)   . Cocaine abuse in remission   . Coronary atherosclerosis of native coronary artery    Previous invasive cardiac testing was done at a facility in [place], [place].  Occluded diagonal, Diffuse LAD disease, Occluded Cx, and non obstructive RCA.   . Essential hypertension, benign   . Headache   . History of pneumonia   . Ischemic cardiomyopathy    LVEF 30-35%  .  Mixed hyperlipidemia   . PVD (peripheral vascular disease) (HCC)   . Raynaud's phenomenon   . Stroke [facility]) 2007   Total occlusion of the right internal carotid    Past Surgical History:  Procedure Laterality Date  . ABDOMINAL AORTAGRAM N/A 05/25/2014   Procedure: ABDOMINAL Ronny Flurry;  Surgeon: Iran Ouch, MD;  [facility]: [facility] CATH LAB;  Service: Cardiovascular;  Laterality: N/A;  . APPENDECTOMY    . CARDIAC DEFIBRILLATOR PLACEMENT     St.Jude ICD  . EP IMPLANTABLE DEVICE N/A 03/01/2016   Procedure: ICD Generator Changeout;  Surgeon: [name], MD;  Location: [facility] INVASIVE CV LAB;  Service: Cardiovascular;  Laterality: N/A;  . L1 corpectomy   12/10  . Laryngeal polyp excision      Current Outpatient Prescriptions  Medication Sig Dispense Refill  . aspirin EC 81 MG tablet Take 81 mg by mouth daily.    Marland Kitchen aspirin-acetaminophen-caffeine (EXCEDRIN MIGRAINE) 250-250-65 MG tablet Take 2 tablets by mouth once as needed for headache or migraine.    . carvedilol (COREG) 12.5 MG tablet TAKE 1 TABLET BY MOUTH TWICE DAILY WITH MEALS 180 tablet 3  . clonazePAM (KLONOPIN) 1 MG tablet Take 1 mg by mouth 4 (four) times daily as needed for anxiety.    . DULoxetine (CYMBALTA) 60 MG capsule Take 60 mg by mouth every morning.    . furosemide (LASIX) 40 MG tablet TAKE 1 TABLET BY MOUTH DAILY AS NEEDED FOR SWELLING. 30 tablet 3  . furosemide (LASIX) 80 MG tablet TAKE 1 TABLET (80 MG  TOTAL) BY MOUTH DAILY. (Patient taking differently: Take 80 mg by mouth daily. May take an extra 40 mg tablet if needed for weight gain of 3-4 lbs.) 90 tablet 3  . gabapentin (NEURONTIN) 300 MG capsule Take 300 mg by mouth 2 (two) times daily.    Marland Kitchen HYDROcodone-acetaminophen (NORCO) 10-325 MG per tablet Take 0.5 tablets by mouth 4 (four) times daily as needed for moderate pain or severe pain. pain    . isosorbide mononitrate (IMDUR) 30 MG 24 hr tablet TAKE 1 TABLET BY MOUTH DAILY 90 tablet 3  . losartan (COZAAR) 25 MG tablet  TAKE 1/2 OF A TABLET BY MOUTH DAILY (Patient taking differently: TAKE 1/2 OF A TABLET BY MOUTH DAILY AT BEDTIME) 45 tablet 3  . Multiple Vitamins-Minerals (MULTIVITAMINS THER. W/MINERALS) TABS Take 1 tablet by mouth daily.      . nicotine (NICODERM CQ - DOSED IN MG/24 HR) 7 mg/24hr patch Place 1 patch (7 mg total) onto the skin daily. 28 patch 0  . nitroGLYCERIN (NITROSTAT) 0.4 MG SL tablet PLACE 1 TABLET UNDER THE TONGUE EVERY 5 MINUTES FOR 3 DOSES AS NEEDED CHEST PAIN 25 tablet 3  . potassium chloride SA (K-DUR,KLOR-CON) 20 MEQ tablet TAKE 1 TABLET (20 MEQ TOTAL) BY MOUTH DAILY. 90 tablet 3  . promethazine (PHENERGAN) 25 MG tablet Take 25 mg by mouth every 6 (six) hours as needed for nausea.    . ranitidine (ZANTAC) 150 MG tablet Take 150 mg by mouth 2 (two) times daily.    Marland Kitchen spironolactone (ALDACTONE) 25 MG tablet TAKE 1 TABLET BY MOUTH EVERY DAY 90 tablet 3  . warfarin (COUMADIN) 10 MG tablet Take 1 tablet daily except 1/2 tablet on Thursdays (Patient taking differently: Take 5-10 mg by mouth See admin instructions. TAKES 5MG  ON TUESDAYS AND THURSDAYS AND TAKES 10MG  ON ALL OTHER DAYS) 30 tablet 3   No current facility-administered medications for this visit.    Allergies:  Metoclopramide hcl; Penicillins; Metoprolol; and Statins   Social History: The patient  reports that she has been smoking E-cigarettes.  She started smoking about 39 years ago. She has a 43.00 pack-year smoking history. She has never used smokeless tobacco. She reports that she does not drink alcohol or use drugs.   ROS:  Please see the history of present illness. Otherwise, complete review of systems is positive for none.  All other systems are reviewed and negative.   Physical Exam: VS:  BP 98/62   Pulse 65   Ht 5\' 6"  (1.676 m)   Wt 222 lb (100.7 kg)   SpO2 97%   BMI 35.83 kg/m , BMI Body mass index is 35.83 kg/m.  Wt Readings from Last 3 Encounters:  07/03/17 222 lb (100.7 kg)  06/18/17 224 lb (101.6 kg)    03/05/17 225 lb (102.1 kg)    General: Overweight woman in no distress. HEENT: Conjunctiva and lids normal, oropharynx clear. Neck: Supple, no elevated JVP, no thyromegaly. Lungs: Clear to auscultation decreased at the bases, nonlabored breathing at rest. Cardiac: Indistinct PMI, regular rate and rhythm, no S3, no pericardial rub. Abdomen: Soft, bowel sounds present, no guarding or rebound. Extremities: 1+ edema, distal pulses 2+. Musculoskeletal: No kyphosis. Skin: Warm and dry. Neuropsychiatric: Alert and oriented 3, affect appropriate.  ECG: I personally reviewed the tracing from 06/18/2017 which showed sinus rhythm with IVCD.  Recent Labwork: 06/18/2017: TSH 0.626 06/19/2017: BUN 13; Creatinine, Ser 0.69; Hemoglobin 13.1; Platelets 142; Potassium 3.3; Sodium 135     Component  Value Date/Time   CHOL 147 06/19/2017 0226   TRIG 97 06/19/2017 0226   HDL 31 (L) 06/19/2017 0226   CHOLHDL 4.7 06/19/2017 0226   VLDL 19 06/19/2017 0226   LDLCALC 97 06/19/2017 0226    Other Studies Reviewed Today:  Echocardiogram 06/19/2017: Left ventricle:  LVEF is approximately 30% with severe hypokinesis / akinesis fo the inferior , distal anterior, and apical walls The cavity size was severely dilated. Wall thickness was normal. Doppler parameters are consistent with abnormal left ventricular relaxation (grade 1 diastolic dysfunction). - Mitral valve: There was mild regurgitation. - Left atrium: The atrium was mildly dilated.  Lexiscan Myoview 06/19/2017:  Electrically non diagnostic for ischemia  Large defect in the anterior, anterolateral, anteroseptal, inferolateral, and apical distribution consistent with scar and some probable soft tissua attenuation.  LVEF 16%  High risk scan.  Findings consistent with prior myocardial infarction.  This is a high risk study.  Lower extremity arterial Dopplers 03/28/2016: Normal bilateral ABIs representing improvement compared to the prior  examination.   Carotid Dopplers 03/28/2016: Occluded RICA with 1-39% LICA stenosis and normal subclavians.  Assessment and Plan:  1. Ischemic cardiomyopathy, LVEF approximately 30% by recent echocardiogram.  2. Chronic systolic heart failure, appears euvolemic at this time on present Lasix dose.  3. CAD, angina symptoms noted back in June although no clear ACS and Myoview showed no large ischemic territories. Plan to continue medical therapy and observation.  4. Hypercoagulable state with anti-cardiolipin antibody. She remains on chronic Coumadin.  5. Carotid artery disease, known occlusion of the RICA with mild disease on the left.  6. St. Jude ICD in place, no device shocks. Keep follow-up with Dr. Ladona Ridgel.  Current medicines were reviewed with the patient today.  Disposition: Follow-up in 4 months.  Signed, [name], MD, [facility] 07/03/2017 10:13 AM    [facility] at [facility] [address] [place], [place], [place] [address] Phone: [phone]; Fax: [phone]

## 2017-07-03 ENCOUNTER — Encounter: Payer: Self-pay | Admitting: Cardiology

## 2017-07-03 ENCOUNTER — Ambulatory Visit (INDEPENDENT_AMBULATORY_CARE_PROVIDER_SITE_OTHER): Payer: Medicare PPO | Admitting: Cardiology

## 2017-07-03 VITALS — BP 98/62 | HR 65 | Ht 66.0 in | Wt 222.0 lb

## 2017-07-03 DIAGNOSIS — I255 Ischemic cardiomyopathy: Secondary | ICD-10-CM

## 2017-07-03 DIAGNOSIS — I25119 Atherosclerotic heart disease of native coronary artery with unspecified angina pectoris: Secondary | ICD-10-CM

## 2017-07-03 DIAGNOSIS — Z95810 Presence of automatic (implantable) cardiac defibrillator: Secondary | ICD-10-CM

## 2017-07-03 DIAGNOSIS — I5022 Chronic systolic (congestive) heart failure: Secondary | ICD-10-CM | POA: Diagnosis not present

## 2017-07-03 DIAGNOSIS — I779 Disorder of arteries and arterioles, unspecified: Secondary | ICD-10-CM

## 2017-07-03 DIAGNOSIS — M545 Low back pain: Secondary | ICD-10-CM | POA: Diagnosis not present

## 2017-07-03 DIAGNOSIS — D6859 Other primary thrombophilia: Secondary | ICD-10-CM | POA: Diagnosis not present

## 2017-07-03 DIAGNOSIS — I110 Hypertensive heart disease with heart failure: Secondary | ICD-10-CM | POA: Diagnosis not present

## 2017-07-03 DIAGNOSIS — I739 Peripheral vascular disease, unspecified: Secondary | ICD-10-CM

## 2017-07-03 NOTE — Patient Instructions (Signed)
Your physician recommends that you schedule a follow-up appointment in: 4 MONTHS WITH DR. MCDOWELL  Your physician recommends that you continue on your current medications as directed. Please refer to the Current Medication list given to you today.  Thank you for choosing [facility]!!    

## 2017-07-07 ENCOUNTER — Encounter (HOSPITAL_COMMUNITY): Payer: Self-pay

## 2017-07-07 ENCOUNTER — Ambulatory Visit (HOSPITAL_COMMUNITY): Payer: Medicare PPO

## 2017-07-08 ENCOUNTER — Ambulatory Visit (INDEPENDENT_AMBULATORY_CARE_PROVIDER_SITE_OTHER): Payer: Medicare PPO | Admitting: *Deleted

## 2017-07-08 DIAGNOSIS — I679 Cerebrovascular disease, unspecified: Secondary | ICD-10-CM | POA: Diagnosis not present

## 2017-07-08 DIAGNOSIS — D6861 Antiphospholipid syndrome: Secondary | ICD-10-CM

## 2017-07-08 DIAGNOSIS — Z5181 Encounter for therapeutic drug level monitoring: Secondary | ICD-10-CM

## 2017-07-08 LAB — POCT INR: INR: 4.4

## 2017-07-15 ENCOUNTER — Telehealth (HOSPITAL_COMMUNITY): Payer: Self-pay | Admitting: Radiology

## 2017-07-18 ENCOUNTER — Encounter (HOSPITAL_COMMUNITY): Payer: Self-pay

## 2017-07-18 ENCOUNTER — Ambulatory Visit (HOSPITAL_COMMUNITY): Payer: Medicare PPO | Attending: Interventional Radiology

## 2017-07-18 ENCOUNTER — Ambulatory Visit (HOSPITAL_COMMUNITY): Admission: RE | Admit: 2017-07-18 | Payer: Medicare PPO | Source: Ambulatory Visit

## 2017-07-27 ENCOUNTER — Other Ambulatory Visit: Payer: Self-pay | Admitting: Cardiology

## 2017-07-28 ENCOUNTER — Ambulatory Visit (HOSPITAL_COMMUNITY)
Admission: RE | Admit: 2017-07-28 | Discharge: 2017-07-28 | Disposition: A | Discharge status: Home / Self Care | Payer: Medicare PPO | Source: Ambulatory Visit | Attending: Interventional Radiology | Admitting: Interventional Radiology

## 2017-07-28 ENCOUNTER — Ambulatory Visit (HOSPITAL_COMMUNITY): Payer: Medicare PPO

## 2017-07-28 DIAGNOSIS — I6521 Occlusion and stenosis of right carotid artery: Secondary | ICD-10-CM | POA: Diagnosis not present

## 2017-07-28 DIAGNOSIS — I6501 Occlusion and stenosis of right vertebral artery: Secondary | ICD-10-CM | POA: Diagnosis not present

## 2017-07-28 DIAGNOSIS — I639 Cerebral infarction, unspecified: Secondary | ICD-10-CM | POA: Diagnosis not present

## 2017-07-28 DIAGNOSIS — I729 Aneurysm of unspecified site: Secondary | ICD-10-CM | POA: Insufficient documentation

## 2017-07-28 MED ORDER — IOPAMIDOL (ISOVUE-370) INJECTION 76%
INTRAVENOUS | Status: AC
  Administered 2017-07-28: 50 mL via INTRAVENOUS
  Filled 2017-07-28: qty 50

## 2017-07-31 ENCOUNTER — Ambulatory Visit (INDEPENDENT_AMBULATORY_CARE_PROVIDER_SITE_OTHER): Payer: Medicare PPO | Admitting: *Deleted

## 2017-07-31 DIAGNOSIS — I679 Cerebrovascular disease, unspecified: Secondary | ICD-10-CM | POA: Diagnosis not present

## 2017-07-31 DIAGNOSIS — Z5181 Encounter for therapeutic drug level monitoring: Secondary | ICD-10-CM

## 2017-07-31 DIAGNOSIS — D6861 Antiphospholipid syndrome: Secondary | ICD-10-CM

## 2017-07-31 LAB — POCT INR: INR: 6.6

## 2017-08-05 ENCOUNTER — Telehealth (HOSPITAL_COMMUNITY): Payer: Self-pay | Admitting: Radiology

## 2017-08-05 ENCOUNTER — Ambulatory Visit (INDEPENDENT_AMBULATORY_CARE_PROVIDER_SITE_OTHER): Payer: Medicare PPO | Admitting: *Deleted

## 2017-08-05 DIAGNOSIS — I679 Cerebrovascular disease, unspecified: Secondary | ICD-10-CM | POA: Diagnosis not present

## 2017-08-05 DIAGNOSIS — D6861 Antiphospholipid syndrome: Secondary | ICD-10-CM

## 2017-08-05 DIAGNOSIS — Z5181 Encounter for therapeutic drug level monitoring: Secondary | ICD-10-CM | POA: Diagnosis not present

## 2017-08-05 LAB — POCT INR: INR: 1.3

## 2017-08-05 NOTE — Telephone Encounter (Signed)
Called pt to let her know Dr. Corliss Skains wants her to have a CTA with 3D Recon. The patient has questions before scheduling this test. I will speak to Dr. Corliss Skains and call her back. Patient agrees with this plan of care. JM

## 2017-08-10 ENCOUNTER — Other Ambulatory Visit: Payer: Self-pay | Admitting: Cardiology

## 2017-08-11 ENCOUNTER — Other Ambulatory Visit (HOSPITAL_COMMUNITY): Payer: Self-pay | Admitting: Interventional Radiology

## 2017-08-11 DIAGNOSIS — I671 Cerebral aneurysm, nonruptured: Secondary | ICD-10-CM

## 2017-08-14 ENCOUNTER — Ambulatory Visit (INDEPENDENT_AMBULATORY_CARE_PROVIDER_SITE_OTHER): Payer: Medicare PPO | Admitting: *Deleted

## 2017-08-14 DIAGNOSIS — D6861 Antiphospholipid syndrome: Secondary | ICD-10-CM | POA: Diagnosis not present

## 2017-08-14 DIAGNOSIS — I679 Cerebrovascular disease, unspecified: Secondary | ICD-10-CM

## 2017-08-14 DIAGNOSIS — Z5181 Encounter for therapeutic drug level monitoring: Secondary | ICD-10-CM

## 2017-08-14 LAB — POCT INR: INR: 3.2

## 2017-08-19 ENCOUNTER — Telehealth (HOSPITAL_COMMUNITY): Payer: Self-pay

## 2017-08-19 NOTE — Telephone Encounter (Signed)
Pt agreed to f/u in 1 yr with cta head and neck. AW 

## 2017-09-04 ENCOUNTER — Ambulatory Visit (INDEPENDENT_AMBULATORY_CARE_PROVIDER_SITE_OTHER): Payer: Medicare PPO | Admitting: *Deleted

## 2017-09-04 DIAGNOSIS — Z5181 Encounter for therapeutic drug level monitoring: Secondary | ICD-10-CM

## 2017-09-04 DIAGNOSIS — I63549 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: Secondary | ICD-10-CM | POA: Diagnosis not present

## 2017-09-04 DIAGNOSIS — D6861 Antiphospholipid syndrome: Secondary | ICD-10-CM

## 2017-09-04 DIAGNOSIS — I679 Cerebrovascular disease, unspecified: Secondary | ICD-10-CM

## 2017-09-04 LAB — POCT INR: INR: 5.2

## 2017-09-09 ENCOUNTER — Other Ambulatory Visit (HOSPITAL_COMMUNITY): Payer: Self-pay | Admitting: Pulmonary Disease

## 2017-09-09 DIAGNOSIS — M170 Bilateral primary osteoarthritis of knee: Secondary | ICD-10-CM | POA: Diagnosis not present

## 2017-09-09 DIAGNOSIS — I25119 Atherosclerotic heart disease of native coronary artery with unspecified angina pectoris: Secondary | ICD-10-CM | POA: Diagnosis not present

## 2017-09-09 DIAGNOSIS — I110 Hypertensive heart disease with heart failure: Secondary | ICD-10-CM | POA: Diagnosis not present

## 2017-09-09 DIAGNOSIS — R2241 Localized swelling, mass and lump, right lower limb: Secondary | ICD-10-CM

## 2017-09-09 DIAGNOSIS — M545 Low back pain: Secondary | ICD-10-CM | POA: Diagnosis not present

## 2017-09-10 ENCOUNTER — Encounter (HOSPITAL_COMMUNITY): Payer: Self-pay

## 2017-09-10 ENCOUNTER — Ambulatory Visit (HOSPITAL_COMMUNITY)
Admission: RE | Admit: 2017-09-10 | Discharge: 2017-09-10 | Disposition: A | Discharge status: Home / Self Care | Payer: Medicare PPO | Source: Ambulatory Visit | Attending: Pulmonary Disease | Admitting: Pulmonary Disease

## 2017-09-16 ENCOUNTER — Ambulatory Visit (HOSPITAL_COMMUNITY): Admission: RE | Admit: 2017-09-16 | Payer: Medicare PPO | Source: Ambulatory Visit

## 2017-09-18 ENCOUNTER — Ambulatory Visit (INDEPENDENT_AMBULATORY_CARE_PROVIDER_SITE_OTHER): Payer: Medicare PPO | Admitting: *Deleted

## 2017-09-18 DIAGNOSIS — I63549 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: Secondary | ICD-10-CM | POA: Diagnosis not present

## 2017-09-18 DIAGNOSIS — Z5181 Encounter for therapeutic drug level monitoring: Secondary | ICD-10-CM | POA: Diagnosis not present

## 2017-09-18 DIAGNOSIS — D6861 Antiphospholipid syndrome: Secondary | ICD-10-CM

## 2017-09-18 DIAGNOSIS — I679 Cerebrovascular disease, unspecified: Secondary | ICD-10-CM

## 2017-09-18 LAB — POCT INR: INR: 1.7

## 2017-09-30 ENCOUNTER — Ambulatory Visit (INDEPENDENT_AMBULATORY_CARE_PROVIDER_SITE_OTHER): Payer: Medicare PPO | Admitting: *Deleted

## 2017-09-30 DIAGNOSIS — Z5181 Encounter for therapeutic drug level monitoring: Secondary | ICD-10-CM

## 2017-09-30 DIAGNOSIS — D6861 Antiphospholipid syndrome: Secondary | ICD-10-CM | POA: Diagnosis not present

## 2017-09-30 DIAGNOSIS — I63549 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: Secondary | ICD-10-CM | POA: Diagnosis not present

## 2017-09-30 DIAGNOSIS — I679 Cerebrovascular disease, unspecified: Secondary | ICD-10-CM

## 2017-09-30 LAB — POCT INR: INR: 2.9

## 2017-10-21 ENCOUNTER — Ambulatory Visit (INDEPENDENT_AMBULATORY_CARE_PROVIDER_SITE_OTHER): Payer: Medicare PPO | Admitting: *Deleted

## 2017-10-21 DIAGNOSIS — Z5181 Encounter for therapeutic drug level monitoring: Secondary | ICD-10-CM

## 2017-10-21 DIAGNOSIS — I63549 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: Secondary | ICD-10-CM | POA: Diagnosis not present

## 2017-10-21 DIAGNOSIS — D6861 Antiphospholipid syndrome: Secondary | ICD-10-CM | POA: Diagnosis not present

## 2017-10-21 DIAGNOSIS — I679 Cerebrovascular disease, unspecified: Secondary | ICD-10-CM

## 2017-10-21 LAB — POCT INR: INR: 6.8

## 2017-10-23 ENCOUNTER — Ambulatory Visit (INDEPENDENT_AMBULATORY_CARE_PROVIDER_SITE_OTHER): Payer: Medicare PPO | Admitting: *Deleted

## 2017-10-23 DIAGNOSIS — D6861 Antiphospholipid syndrome: Secondary | ICD-10-CM | POA: Diagnosis not present

## 2017-10-23 DIAGNOSIS — I679 Cerebrovascular disease, unspecified: Secondary | ICD-10-CM

## 2017-10-23 DIAGNOSIS — Z5181 Encounter for therapeutic drug level monitoring: Secondary | ICD-10-CM

## 2017-10-23 LAB — POCT INR: INR: 1.5

## 2017-10-30 ENCOUNTER — Ambulatory Visit (INDEPENDENT_AMBULATORY_CARE_PROVIDER_SITE_OTHER): Payer: Medicare PPO | Admitting: *Deleted

## 2017-10-30 DIAGNOSIS — D6861 Antiphospholipid syndrome: Secondary | ICD-10-CM

## 2017-10-30 DIAGNOSIS — Z5181 Encounter for therapeutic drug level monitoring: Secondary | ICD-10-CM | POA: Diagnosis not present

## 2017-10-30 DIAGNOSIS — I63549 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: Secondary | ICD-10-CM | POA: Diagnosis not present

## 2017-10-30 DIAGNOSIS — I679 Cerebrovascular disease, unspecified: Secondary | ICD-10-CM

## 2017-10-30 LAB — POCT INR: INR: 3.2

## 2017-11-17 NOTE — Progress Notes (Signed)
Cardiology Office Note  Date: 11/18/2017   ID: [name] [name], DOB February 09, 1961, MRN [id number]  PCP: Kari Baars, MD  Primary Cardiologist: Nona Dell, MD   Chief Complaint  Patient presents with  . Cardiomyopathy    History of Present Illness: [name] [name] is a 56 y.o. female last seen in July.  She presents for a routine follow-up visit.  Reports no reproducible exertional chest pain or palpitations.  States that she has a suspected sinus infection with head congestion and rhinorrhea, plans to see Dr. [name] today.  She continues on Coumadin with follow-up in the anticoagulation clinic.  INR 3.8 today.  She does not report any spontaneous bleeding problems.  Current cardiac regimen includes low-dose aspirin, Coreg, Lasix, potassium supplements, Imdur, Cozaar, Aldactone, and Coumadin.  We discussed obtaining a follow-up BMET.   She follows with Dr. Ladona Ridgel in the device clinic, [facility] ICD in place.  No device shocks.  Past Medical History:  Diagnosis Date  . Aneurysm (HCC)    internal carotid artery  . Anticardiolipin antibody positive    Chronic Coumadin  . Chronic combined systolic (congestive) and diastolic (congestive) heart failure (HCC)   . Cocaine abuse in remission (HCC)   . Coronary atherosclerosis of native coronary artery    Previous invasive cardiac testing was done at a facility in [place], [place].  Occluded diagonal, Diffuse LAD disease, Occluded Cx, and non obstructive RCA.   . Essential hypertension, benign   . Headache   . History of pneumonia   . Ischemic cardiomyopathy    LVEF 30-35%  . Mixed hyperlipidemia   . PVD (peripheral vascular disease) (HCC)   . Raynaud's phenomenon   . Stroke [facility]) 2007   Total occlusion of the right internal carotid    Past Surgical History:  Procedure Laterality Date  . ABDOMINAL AORTAGRAM N/A 05/25/2014   Procedure: ABDOMINAL Ronny Flurry;  Surgeon: Iran Ouch, MD;  Location: [facility] CATH LAB;   Service: Cardiovascular;  Laterality: N/A;  . APPENDECTOMY    . CARDIAC DEFIBRILLATOR PLACEMENT     St.Jude ICD  . EP IMPLANTABLE DEVICE N/A 03/01/2016   Procedure: ICD Generator Changeout;  Surgeon: [name], MD;  Location: [facility] INVASIVE CV LAB;  Service: Cardiovascular;  Laterality: N/A;  . L1 corpectomy   12/10  . Laryngeal polyp excision      Current Outpatient Medications  Medication Sig Dispense Refill  . aspirin EC 81 MG tablet Take 81 mg by mouth daily.    Marland Kitchen aspirin-acetaminophen-caffeine (EXCEDRIN MIGRAINE) 250-250-65 MG tablet Take 2 tablets by mouth once as needed for headache or migraine.    . carvedilol (COREG) 12.5 MG tablet TAKE 1 TABLET BY MOUTH TWICE DAILY WITH MEALS 180 tablet 3  . clonazePAM (KLONOPIN) 1 MG tablet Take 1 mg by mouth 4 (four) times daily as needed for anxiety.    . DULoxetine (CYMBALTA) 60 MG capsule Take 60 mg by mouth every morning.    . furosemide (LASIX) 40 MG tablet TAKE 1 TABLET BY MOUTH DAILY AS NEEDED FOR SWELLING. 30 tablet 6  . furosemide (LASIX) 80 MG tablet TAKE 1 TABLET BY MOUTH DAILY 90 tablet 1  . gabapentin (NEURONTIN) 300 MG capsule Take 300 mg by mouth 2 (two) times daily.    Marland Kitchen HYDROcodone-acetaminophen (NORCO) 10-325 MG per tablet Take 0.5 tablets by mouth 4 (four) times daily as needed for moderate pain or severe pain. pain    . isosorbide mononitrate (IMDUR) 30 MG 24  hr tablet TAKE 1 TABLET BY MOUTH DAILY 90 tablet 3  . losartan (COZAAR) 25 MG tablet TAKE 1/2 OF A TABLET BY MOUTH DAILY (Patient taking differently: TAKE 1/2 OF A TABLET BY MOUTH DAILY AT BEDTIME) 45 tablet 3  . Multiple Vitamins-Minerals (MULTIVITAMINS THER. W/MINERALS) TABS Take 1 tablet by mouth daily.      . nicotine (NICODERM CQ - DOSED IN MG/24 HR) 7 mg/24hr patch Place 1 patch (7 mg total) onto the skin daily. 28 patch 0  . nitroGLYCERIN (NITROSTAT) 0.4 MG SL tablet PLACE 1 TABLET UNDER THE TONGUE EVERY 5 MINUTES FOR 3 DOSES AS NEEDED CHEST PAIN 25 tablet 3  .  potassium chloride SA (K-DUR,KLOR-CON) 20 MEQ tablet TAKE 1 TABLET BY MOUTH EVERY DAY 90 tablet 3  . promethazine (PHENERGAN) 25 MG tablet Take 25 mg by mouth every 6 (six) hours as needed for nausea.    . ranitidine (ZANTAC) 150 MG tablet Take 150 mg by mouth 2 (two) times daily.    Marland Kitchen. spironolactone (ALDACTONE) 25 MG tablet TAKE 1 TABLET BY MOUTH EVERY DAY 90 tablet 3  . TiZANidine HCl (ZANAFLEX PO) Take by mouth.    . warfarin (COUMADIN) 10 MG tablet Take 1 tablet daily except 1/2 tablet on Thursdays (Patient taking differently: Take 5-10 mg by mouth See admin instructions. TAKES 5MG  ON TUESDAYS AND THURSDAYS AND TAKES 10MG  ON ALL OTHER DAYS) 30 tablet 3   No current facility-administered medications for this visit.    Allergies:  Metoclopramide hcl; Penicillins; Metoprolol; and Statins   Social History: The patient  reports that she has been smoking e-cigarettes.  She started smoking about 39 years ago. She has a 43.00 pack-year smoking history. she has never used smokeless tobacco. She reports that she does not drink alcohol or use drugs.   ROS:  Please see the history of present illness. Otherwise, complete review of systems is positive for head congestion.  All other systems are reviewed and negative.   Physical Exam: VS:  BP 118/82   Pulse 72   Ht 5\' 7"  (1.702 m)   Wt 239 lb (108.4 kg)   SpO2 93%   BMI 37.43 kg/m , BMI Body mass index is 37.43 kg/m.  Wt Readings from Last 3 Encounters:  11/18/17 239 lb (108.4 kg)  07/03/17 222 lb (100.7 kg)  06/18/17 224 lb (101.6 kg)    General: Morbidly obese woman, appears comfortable at rest. HEENT: Conjunctiva and lids normal, oropharynx clear. Neck: Supple, no elevated JVP or carotid bruits, no thyromegaly. Lungs: Clear to auscultation, nonlabored breathing at rest. Cardiac: Indistinct PMI, RRR, no S3 or significant systolic murmur, no pericardial rub. Abdomen: Soft, nontender, bowel sounds present, no guarding or  rebound. Extremities: Mild ankle edema, distal pulses 2+. Skin: Warm and dry. Musculoskeletal: No kyphosis. Neuropsychiatric: Alert and oriented x3, affect grossly appropriate.  ECG: I personally reviewed the tracing from 07/18/2017 which showed sinus rhythm with low voltage, nonspecific ST changes, and old anterior infarct pattern.  Recent Labwork: 06/18/2017: TSH 0.626 06/19/2017: BUN 13; Creatinine, Ser 0.69; Hemoglobin 13.1; Platelets 142; Potassium 3.3; Sodium 135     Component Value Date/Time   CHOL 147 06/19/2017 0226   TRIG 97 06/19/2017 0226   HDL 31 (L) 06/19/2017 0226   CHOLHDL 4.7 06/19/2017 0226   VLDL 19 06/19/2017 0226   LDLCALC 97 06/19/2017 0226    Other Studies Reviewed Today:  Echocardiogram 06/19/2017: Study Conclusions  - Left ventricle: The cavity size was severely dilated. Wall  thickness was normal. Doppler parameters are consistent with   abnormal left ventricular relaxation (grade 1 diastolic   dysfunction). - Mitral valve: There was mild regurgitation. - Left atrium: The atrium was mildly dilated.  Lexiscan Myoview 06/19/2017:  Electrically non diagnostic for ischemia  Large defect in the anterior, anterolateral, anteroseptal, inferolateral, and apical distribution consistent with scar and some probable soft tissua attenuation.  LVEF 16%  High risk scan.  Findings consistent with prior myocardial infarction.  This is a high risk study.  Assessment and Plan:  1.  Chronic systolic heart failure with ischemic cardiomyopathy and LVEF approximately 30%.  Plan to continue medical regimen, no change in diuretic therapy for now.  Follow-up BMET.  2.  Morbid obesity, diet and weight loss discussed.  3.  CAD without progressive angina symptoms.  Myoview study from June showed large region of scar but no active ischemia.   4.  Hypercoagulable state with anticardiolipin antibody.  She continues on chronic Coumadin.  5.  Carotid artery disease with  occlusion of the R ICA and mild left-sided disease.  6.  St. Jude ICD in place.  Keep follow-up with Dr. Ladona Ridgel.  Current medicines were reviewed with the patient today.   Orders Placed This Encounter  Procedures  . Basic metabolic panel    Disposition: Follow-up in 6 months.  Signed, [name], MD, [facility] 11/18/2017 12:06 PM    [facility] at [facility] [address] [place], [place], [place] [address] Phone: [phone]; Fax: [phone]

## 2017-11-18 ENCOUNTER — Encounter: Payer: Self-pay | Admitting: Cardiology

## 2017-11-18 ENCOUNTER — Ambulatory Visit (INDEPENDENT_AMBULATORY_CARE_PROVIDER_SITE_OTHER): Payer: Medicare PPO | Admitting: *Deleted

## 2017-11-18 ENCOUNTER — Ambulatory Visit: Payer: Medicare PPO | Admitting: Cardiology

## 2017-11-18 VITALS — BP 118/82 | HR 72 | Ht 67.0 in | Wt 239.0 lb

## 2017-11-18 DIAGNOSIS — D6861 Antiphospholipid syndrome: Secondary | ICD-10-CM

## 2017-11-18 DIAGNOSIS — I25119 Atherosclerotic heart disease of native coronary artery with unspecified angina pectoris: Secondary | ICD-10-CM | POA: Diagnosis not present

## 2017-11-18 DIAGNOSIS — Z5181 Encounter for therapeutic drug level monitoring: Secondary | ICD-10-CM

## 2017-11-18 DIAGNOSIS — I5022 Chronic systolic (congestive) heart failure: Secondary | ICD-10-CM

## 2017-11-18 DIAGNOSIS — I6523 Occlusion and stenosis of bilateral carotid arteries: Secondary | ICD-10-CM | POA: Diagnosis not present

## 2017-11-18 DIAGNOSIS — Z95810 Presence of automatic (implantable) cardiac defibrillator: Secondary | ICD-10-CM | POA: Diagnosis not present

## 2017-11-18 DIAGNOSIS — Z79891 Long term (current) use of opiate analgesic: Secondary | ICD-10-CM | POA: Diagnosis not present

## 2017-11-18 DIAGNOSIS — I255 Ischemic cardiomyopathy: Secondary | ICD-10-CM

## 2017-11-18 DIAGNOSIS — I679 Cerebrovascular disease, unspecified: Secondary | ICD-10-CM

## 2017-11-18 LAB — POCT INR: INR: 3.8

## 2017-11-18 NOTE — Patient Instructions (Signed)
Medication Instructions:  Your physician recommends that you continue on your current medications as directed. Please refer to the Current Medication list given to you today.  Labwork: BMET - Orders given today   Testing/Procedures: NONE  Follow-Up: Your physician wants you to follow-up in: 6 MONTHS WITH DR. MCDOWELL. You will receive a reminder letter in the mail two months in advance. If you don't receive a letter, please call our office to schedule the follow-up appointment.  Any Other Special Instructions Will Be Listed Below (If Applicable).  If you need a refill on your cardiac medications before your next appointment, please call your pharmacy. 

## 2017-12-04 ENCOUNTER — Ambulatory Visit (INDEPENDENT_AMBULATORY_CARE_PROVIDER_SITE_OTHER): Payer: Medicare PPO | Admitting: *Deleted

## 2017-12-04 DIAGNOSIS — Z5181 Encounter for therapeutic drug level monitoring: Secondary | ICD-10-CM

## 2017-12-04 DIAGNOSIS — D6861 Antiphospholipid syndrome: Secondary | ICD-10-CM

## 2017-12-04 DIAGNOSIS — I679 Cerebrovascular disease, unspecified: Secondary | ICD-10-CM | POA: Diagnosis not present

## 2017-12-04 LAB — POCT INR: INR: 2.1

## 2017-12-25 ENCOUNTER — Ambulatory Visit (INDEPENDENT_AMBULATORY_CARE_PROVIDER_SITE_OTHER): Payer: Medicare PPO | Admitting: *Deleted

## 2017-12-25 DIAGNOSIS — D6861 Antiphospholipid syndrome: Secondary | ICD-10-CM | POA: Diagnosis not present

## 2017-12-25 DIAGNOSIS — Z5181 Encounter for therapeutic drug level monitoring: Secondary | ICD-10-CM | POA: Diagnosis not present

## 2017-12-25 DIAGNOSIS — I679 Cerebrovascular disease, unspecified: Secondary | ICD-10-CM | POA: Diagnosis not present

## 2017-12-25 LAB — POCT INR: INR: 2.8

## 2017-12-25 NOTE — Patient Instructions (Signed)
Continue coumadin 1 tablet daily except 1/2 tablet on Mondays, Wednesdays and Fridays Recheck in 4 weeks 

## 2018-01-12 ENCOUNTER — Telehealth: Payer: Self-pay | Admitting: Cardiology

## 2018-01-12 NOTE — Telephone Encounter (Signed)
Patient fell on ribs.  Wants to make sure she did not mess her pace maker up

## 2018-01-12 NOTE — Telephone Encounter (Signed)
Message fwd to pacer clinic.

## 2018-01-12 NOTE — Telephone Encounter (Signed)
Spoke with pt, pt stated that there was no bruising around defibrillator site informed pt if there was no bruising than likely there was no damage done to device. Pts asked about who she should call about pain from the fall informed her that she should call her primary care doctor or go to urgent care. Pt voiced understanding

## 2018-01-14 ENCOUNTER — Ambulatory Visit (HOSPITAL_COMMUNITY)
Admission: RE | Admit: 2018-01-14 | Discharge: 2018-01-14 | Disposition: A | Discharge status: Home / Self Care | Payer: Medicare PPO | Source: Ambulatory Visit | Attending: Pulmonary Disease | Admitting: Pulmonary Disease

## 2018-01-14 ENCOUNTER — Other Ambulatory Visit (HOSPITAL_COMMUNITY): Payer: Self-pay | Admitting: Pulmonary Disease

## 2018-01-14 DIAGNOSIS — W19XXXA Unspecified fall, initial encounter: Secondary | ICD-10-CM

## 2018-01-14 DIAGNOSIS — Z9181 History of falling: Secondary | ICD-10-CM | POA: Insufficient documentation

## 2018-01-14 DIAGNOSIS — I517 Cardiomegaly: Secondary | ICD-10-CM | POA: Insufficient documentation

## 2018-01-14 DIAGNOSIS — W109XXA Fall (on) (from) unspecified stairs and steps, initial encounter: Secondary | ICD-10-CM | POA: Insufficient documentation

## 2018-01-14 DIAGNOSIS — S20219A Contusion of unspecified front wall of thorax, initial encounter: Secondary | ICD-10-CM | POA: Diagnosis not present

## 2018-01-25 ENCOUNTER — Other Ambulatory Visit: Payer: Self-pay | Admitting: Cardiology

## 2018-01-27 ENCOUNTER — Ambulatory Visit (INDEPENDENT_AMBULATORY_CARE_PROVIDER_SITE_OTHER): Payer: Medicare PPO | Admitting: *Deleted

## 2018-01-27 DIAGNOSIS — Z5181 Encounter for therapeutic drug level monitoring: Secondary | ICD-10-CM | POA: Diagnosis not present

## 2018-01-27 DIAGNOSIS — I679 Cerebrovascular disease, unspecified: Secondary | ICD-10-CM

## 2018-01-27 DIAGNOSIS — D6861 Antiphospholipid syndrome: Secondary | ICD-10-CM | POA: Diagnosis not present

## 2018-01-27 DIAGNOSIS — I63549 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: Secondary | ICD-10-CM | POA: Diagnosis not present

## 2018-01-27 LAB — POCT INR: INR: 4.9

## 2018-01-27 NOTE — Patient Instructions (Signed)
Hold coumadin tonight then resume 1 tablet daily except 1/2 tablet on Mondays, Wednesdays and Fridays Eat extra greens today and tomorrow Recheck in 2 weeks

## 2018-02-10 ENCOUNTER — Ambulatory Visit (INDEPENDENT_AMBULATORY_CARE_PROVIDER_SITE_OTHER): Payer: Medicare PPO | Admitting: *Deleted

## 2018-02-10 DIAGNOSIS — D6861 Antiphospholipid syndrome: Secondary | ICD-10-CM

## 2018-02-10 DIAGNOSIS — Z5181 Encounter for therapeutic drug level monitoring: Secondary | ICD-10-CM | POA: Diagnosis not present

## 2018-02-10 DIAGNOSIS — I679 Cerebrovascular disease, unspecified: Secondary | ICD-10-CM

## 2018-02-10 DIAGNOSIS — I63549 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: Secondary | ICD-10-CM | POA: Diagnosis not present

## 2018-02-10 LAB — POCT INR: INR: 2.4

## 2018-02-10 NOTE — Patient Instructions (Signed)
Continue coumadin 1 tablet daily except 1/2 tablet on Mondays, Wednesdays and Fridays Keep greens consistant Recheck in 4 weeks 

## 2018-02-19 DIAGNOSIS — J209 Acute bronchitis, unspecified: Secondary | ICD-10-CM | POA: Diagnosis not present

## 2018-02-19 DIAGNOSIS — J111 Influenza due to unidentified influenza virus with other respiratory manifestations: Secondary | ICD-10-CM | POA: Diagnosis not present

## 2018-02-19 DIAGNOSIS — I25119 Atherosclerotic heart disease of native coronary artery with unspecified angina pectoris: Secondary | ICD-10-CM | POA: Diagnosis not present

## 2018-02-19 DIAGNOSIS — I110 Hypertensive heart disease with heart failure: Secondary | ICD-10-CM | POA: Diagnosis not present

## 2018-02-20 ENCOUNTER — Other Ambulatory Visit: Payer: Self-pay | Admitting: Cardiology

## 2018-02-23 ENCOUNTER — Encounter: Payer: Self-pay | Admitting: *Deleted

## 2018-02-23 ENCOUNTER — Telehealth: Payer: Self-pay | Admitting: *Deleted

## 2018-02-23 NOTE — Telephone Encounter (Signed)
-----   Message from Norva Pavlov, LPN sent at 9/41/7408 10:45 AM EST ----- Regarding: FYI  Patient has been placed on keflex 500 mg TID x 9 days and prednisone for bronchitis  Patient just wanted to let you know.  Advised patient you would call back Monday [phone] (cell)

## 2018-02-23 NOTE — Progress Notes (Signed)
Pt called on Friday stating she was started on Keflex 500mg 3 x day and Prednisone taper 4mg tab (24mg,20mg,16mg,12mg,8mg,4mg) for the flu.  She will finish prednisone tomorrow. Told pt to hold coumadin tonight, take 5mg tomorrow night then resume regular schedule.  She verbalized understanding and has has an INR appt scheduled.  

## 2018-02-23 NOTE — Telephone Encounter (Signed)
Pt called on Friday stating she was started on Keflex 500mg  3 x day and Prednisone taper 4mg  tab (24mg ,20mg ,16mg ,12mg ,8mg ,4mg ) for the flu.  She will finish prednisone tomorrow. Told pt to hold coumadin tonight, take 5mg  tomorrow night then resume regular schedule.  She verbalized understanding and has has an INR appt scheduled.

## 2018-03-10 ENCOUNTER — Ambulatory Visit (INDEPENDENT_AMBULATORY_CARE_PROVIDER_SITE_OTHER): Payer: Medicare PPO | Admitting: *Deleted

## 2018-03-10 DIAGNOSIS — I679 Cerebrovascular disease, unspecified: Secondary | ICD-10-CM

## 2018-03-10 DIAGNOSIS — D6861 Antiphospholipid syndrome: Secondary | ICD-10-CM

## 2018-03-10 DIAGNOSIS — Z5181 Encounter for therapeutic drug level monitoring: Secondary | ICD-10-CM

## 2018-03-10 DIAGNOSIS — I63549 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: Secondary | ICD-10-CM | POA: Diagnosis not present

## 2018-03-10 LAB — POCT INR: INR: 3.4

## 2018-03-10 NOTE — Patient Instructions (Signed)
Take coumadin 1/2 tablet tonight then resume 1 tablet daily except 1/2 tablet on Mondays, Wednesdays and Fridays Keep greens consistant Recheck in 3 weeks

## 2018-03-24 DIAGNOSIS — I110 Hypertensive heart disease with heart failure: Secondary | ICD-10-CM | POA: Diagnosis not present

## 2018-03-24 DIAGNOSIS — I25119 Atherosclerotic heart disease of native coronary artery with unspecified angina pectoris: Secondary | ICD-10-CM | POA: Diagnosis not present

## 2018-03-24 DIAGNOSIS — H6121 Impacted cerumen, right ear: Secondary | ICD-10-CM | POA: Diagnosis not present

## 2018-03-24 DIAGNOSIS — M545 Low back pain: Secondary | ICD-10-CM | POA: Diagnosis not present

## 2018-03-26 ENCOUNTER — Ambulatory Visit (INDEPENDENT_AMBULATORY_CARE_PROVIDER_SITE_OTHER): Payer: Medicare PPO | Admitting: Otolaryngology

## 2018-03-30 ENCOUNTER — Ambulatory Visit (INDEPENDENT_AMBULATORY_CARE_PROVIDER_SITE_OTHER): Payer: Medicare PPO | Admitting: Otolaryngology

## 2018-03-30 DIAGNOSIS — H6123 Impacted cerumen, bilateral: Secondary | ICD-10-CM

## 2018-03-30 DIAGNOSIS — H900 Conductive hearing loss, bilateral: Secondary | ICD-10-CM

## 2018-03-31 ENCOUNTER — Ambulatory Visit (INDEPENDENT_AMBULATORY_CARE_PROVIDER_SITE_OTHER): Payer: Medicare PPO | Admitting: *Deleted

## 2018-03-31 DIAGNOSIS — Z7901 Long term (current) use of anticoagulants: Secondary | ICD-10-CM | POA: Diagnosis not present

## 2018-03-31 DIAGNOSIS — I63549 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: Secondary | ICD-10-CM

## 2018-03-31 DIAGNOSIS — Z5181 Encounter for therapeutic drug level monitoring: Secondary | ICD-10-CM

## 2018-03-31 LAB — POCT INR: INR: 2.2

## 2018-03-31 NOTE — Patient Instructions (Signed)
Continue coumadin 1 tablet daily except 1/2 tablet on Mondays, Wednesdays and Fridays Keep greens consistant Recheck in 4 weeks 

## 2018-04-03 ENCOUNTER — Other Ambulatory Visit: Payer: Self-pay | Admitting: Cardiology

## 2018-04-06 ENCOUNTER — Ambulatory Visit (INDEPENDENT_AMBULATORY_CARE_PROVIDER_SITE_OTHER): Payer: Medicare PPO | Admitting: Otolaryngology

## 2018-04-06 DIAGNOSIS — H6121 Impacted cerumen, right ear: Secondary | ICD-10-CM

## 2018-04-06 DIAGNOSIS — H9011 Conductive hearing loss, unilateral, right ear, with unrestricted hearing on the contralateral side: Secondary | ICD-10-CM

## 2018-04-09 ENCOUNTER — Ambulatory Visit (INDEPENDENT_AMBULATORY_CARE_PROVIDER_SITE_OTHER): Payer: Medicare PPO | Admitting: Otolaryngology

## 2018-04-09 DIAGNOSIS — H6983 Other specified disorders of Eustachian tube, bilateral: Secondary | ICD-10-CM

## 2018-04-09 DIAGNOSIS — H60331 Swimmer's ear, right ear: Secondary | ICD-10-CM

## 2018-04-09 DIAGNOSIS — H903 Sensorineural hearing loss, bilateral: Secondary | ICD-10-CM | POA: Diagnosis not present

## 2018-04-16 ENCOUNTER — Ambulatory Visit (INDEPENDENT_AMBULATORY_CARE_PROVIDER_SITE_OTHER): Payer: Medicare PPO | Admitting: Otolaryngology

## 2018-04-16 DIAGNOSIS — H60331 Swimmer's ear, right ear: Secondary | ICD-10-CM

## 2018-04-21 ENCOUNTER — Other Ambulatory Visit: Payer: Self-pay | Admitting: Cardiology

## 2018-04-28 ENCOUNTER — Ambulatory Visit (INDEPENDENT_AMBULATORY_CARE_PROVIDER_SITE_OTHER): Payer: Medicare PPO | Admitting: *Deleted

## 2018-04-28 DIAGNOSIS — Z5181 Encounter for therapeutic drug level monitoring: Secondary | ICD-10-CM

## 2018-04-28 DIAGNOSIS — I63549 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: Secondary | ICD-10-CM

## 2018-04-28 LAB — POCT INR: INR: 3

## 2018-04-28 NOTE — Patient Instructions (Signed)
Continue coumadin 1 tablet daily except 1/2 tablet on Mondays, Wednesdays and Fridays Keep greens consistant Recheck in 4 weeks

## 2018-06-16 ENCOUNTER — Ambulatory Visit (INDEPENDENT_AMBULATORY_CARE_PROVIDER_SITE_OTHER): Payer: Medicare PPO | Admitting: *Deleted

## 2018-06-16 DIAGNOSIS — Z5181 Encounter for therapeutic drug level monitoring: Secondary | ICD-10-CM | POA: Diagnosis not present

## 2018-06-16 DIAGNOSIS — I63549 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: Secondary | ICD-10-CM

## 2018-06-16 DIAGNOSIS — D6859 Other primary thrombophilia: Secondary | ICD-10-CM

## 2018-06-16 DIAGNOSIS — D6861 Antiphospholipid syndrome: Secondary | ICD-10-CM | POA: Diagnosis not present

## 2018-06-16 LAB — POCT INR: INR: 2.6 (ref 2.0–3.0)

## 2018-06-16 NOTE — Patient Instructions (Signed)
Continue coumadin 1 tablet daily except 1/2 tablet on Mondays, Wednesdays and Fridays Keep greens consistant Recheck in 5 weeks

## 2018-06-24 DIAGNOSIS — I25119 Atherosclerotic heart disease of native coronary artery with unspecified angina pectoris: Secondary | ICD-10-CM | POA: Diagnosis not present

## 2018-06-24 DIAGNOSIS — M25562 Pain in left knee: Secondary | ICD-10-CM | POA: Diagnosis not present

## 2018-06-24 DIAGNOSIS — Z95810 Presence of automatic (implantable) cardiac defibrillator: Secondary | ICD-10-CM | POA: Diagnosis not present

## 2018-06-24 DIAGNOSIS — M545 Low back pain: Secondary | ICD-10-CM | POA: Diagnosis not present

## 2018-06-24 DIAGNOSIS — Z79891 Long term (current) use of opiate analgesic: Secondary | ICD-10-CM | POA: Diagnosis not present

## 2018-06-24 DIAGNOSIS — I110 Hypertensive heart disease with heart failure: Secondary | ICD-10-CM | POA: Diagnosis not present

## 2018-06-25 ENCOUNTER — Ambulatory Visit: Payer: Medicare PPO | Admitting: Cardiology

## 2018-06-25 ENCOUNTER — Encounter: Payer: Self-pay | Admitting: Cardiology

## 2018-06-25 VITALS — BP 104/71 | HR 70 | Ht 67.0 in | Wt 226.0 lb

## 2018-06-25 DIAGNOSIS — D6859 Other primary thrombophilia: Secondary | ICD-10-CM

## 2018-06-25 DIAGNOSIS — I5022 Chronic systolic (congestive) heart failure: Secondary | ICD-10-CM | POA: Diagnosis not present

## 2018-06-25 DIAGNOSIS — I25119 Atherosclerotic heart disease of native coronary artery with unspecified angina pectoris: Secondary | ICD-10-CM

## 2018-06-25 DIAGNOSIS — I110 Hypertensive heart disease with heart failure: Secondary | ICD-10-CM | POA: Diagnosis not present

## 2018-06-25 DIAGNOSIS — I255 Ischemic cardiomyopathy: Secondary | ICD-10-CM | POA: Diagnosis not present

## 2018-06-25 DIAGNOSIS — M545 Low back pain: Secondary | ICD-10-CM | POA: Diagnosis not present

## 2018-06-25 DIAGNOSIS — I6523 Occlusion and stenosis of bilateral carotid arteries: Secondary | ICD-10-CM

## 2018-06-25 DIAGNOSIS — Z95810 Presence of automatic (implantable) cardiac defibrillator: Secondary | ICD-10-CM | POA: Diagnosis not present

## 2018-06-25 NOTE — Patient Instructions (Signed)

## 2018-06-25 NOTE — Progress Notes (Signed)
Cardiology Office Note  Date: 06/25/2018   ID: [name] [name], DOB 11-Nov-1961, MRN [id number]  PCP: Kari Baars, MD  Primary Cardiologist: Nona Dell, MD   Chief Complaint  Patient presents with  . Cardiomyopathy    History of Present Illness: [name] [name] is a 57 y.o. female last seen in November 2018.  She is here for a follow-up visit.  She reports occasional angina symptoms, not progressive.  Overall stable NYHA class II dyspnea.  She has had no palpitations or syncope.  She remains on Coumadin with follow-up in the anticoagulation clinic.  Last INR was 2.6.  She denies any spontaneous bleeding episodes.  She continues to follow with Dr. Ladona Ridgel in the device clinic, St. Jude ICD in place.  She denies any device discharges.  I personally reviewed her ECG today which shows normal sinus rhythm.  She does had lab work drawn this morning per Dr. [name], results pending.  Physical done recently.  Current cardiac regimen includes aspirin, Coreg, Lasix, Imdur, Aldactone, Cozaar, Coumadin, and as needed nitroglycerin.  We have talked about Entresto, but she tends to run a low blood pressure, not unusual for systolic to be in the 90s with dizziness and we have only been able to have her up to 12.5 mg daily of Cozaar.  Past Medical History:  Diagnosis Date  . Aneurysm (HCC)    internal carotid artery  . Anticardiolipin antibody positive    Chronic Coumadin  . Chronic combined systolic (congestive) and diastolic (congestive) heart failure (HCC)   . Cocaine abuse in remission (HCC)   . Coronary atherosclerosis of native coronary artery    Previous invasive cardiac testing was done at a facility in [place], [place].  Occluded diagonal, Diffuse LAD disease, Occluded Cx, and non obstructive RCA.   . Essential hypertension, benign   . Headache   . History of pneumonia   . Ischemic cardiomyopathy    LVEF 30-35%  . Mixed hyperlipidemia   . PVD (peripheral vascular disease)  (HCC)   . Raynaud's phenomenon   . Stroke [facility]) 2007   Total occlusion of the right internal carotid    Past Surgical History:  Procedure Laterality Date  . ABDOMINAL AORTAGRAM N/A 05/25/2014   Procedure: ABDOMINAL Ronny Flurry;  Surgeon: Iran Ouch, MD;  Location: [facility] CATH LAB;  Service: Cardiovascular;  Laterality: N/A;  . APPENDECTOMY    . CARDIAC DEFIBRILLATOR PLACEMENT     St.Jude ICD  . EP IMPLANTABLE DEVICE N/A 03/01/2016   Procedure: ICD Generator Changeout;  Surgeon: [name], MD;  [facility];  Service: Cardiovascular;  Laterality: N/A;  . L1 corpectomy   12/10  . Laryngeal polyp excision      Current Outpatient Medications  Medication Sig Dispense Refill  . aspirin EC 81 MG tablet Take 81 mg by mouth daily.    Marland Kitchen aspirin-acetaminophen-caffeine (EXCEDRIN MIGRAINE) 250-250-65 MG tablet Take 2 tablets by mouth once as needed for headache or migraine.    . carvedilol (COREG) 12.5 MG tablet TAKE 1 TABLET BY MOUTH TWICE DAILY WITH MEALS 180 tablet 3  . clonazePAM (KLONOPIN) 1 MG tablet Take 1 mg by mouth 4 (four) times daily as needed for anxiety.    . DULoxetine (CYMBALTA) 60 MG capsule Take 60 mg by mouth every morning.    . furosemide (LASIX) 40 MG tablet TAKE 1 TABLET BY MOUTH DAILY AS NEEDED FOR SWELLING. 30 tablet 6  . furosemide (LASIX) 80 MG tablet TAKE  1 TABLET BY MOUTH DAILY 90 tablet 1  . gabapentin (NEURONTIN) 300 MG capsule Take 300 mg by mouth 2 (two) times daily.    Marland Kitchen HYDROcodone-acetaminophen (NORCO) 10-325 MG per tablet Take 0.5 tablets by mouth 4 (four) times daily as needed for moderate pain or severe pain. pain    . isosorbide mononitrate (IMDUR) 30 MG 24 hr tablet TAKE 1 TABLET BY MOUTH DAILY 90 tablet 1  . losartan (COZAAR) 25 MG tablet TAKE 1/2 OF A TABLET BY MOUTH DAILY 45 tablet 0  . Multiple Vitamins-Minerals (MULTIVITAMINS THER. W/MINERALS) TABS Take 1 tablet by mouth daily.      . nicotine (NICODERM CQ - DOSED IN MG/24 HR) 7  mg/24hr patch Place 1 patch (7 mg total) onto the skin daily. 28 patch 0  . nitroGLYCERIN (NITROSTAT) 0.4 MG SL tablet PLACE 1 TABLET UNDER THE TONGUE EVERY 5 MINUTES FOR 3 DOSES AS NEEDED CHEST PAIN 25 tablet 3  . potassium chloride SA (K-DUR,KLOR-CON) 20 MEQ tablet TAKE 1 TABLET BY MOUTH EVERY DAY 90 tablet 3  . promethazine (PHENERGAN) 25 MG tablet Take 25 mg by mouth every 6 (six) hours as needed for nausea.    . ranitidine (ZANTAC) 150 MG tablet Take 150 mg by mouth 2 (two) times daily.    Marland Kitchen spironolactone (ALDACTONE) 25 MG tablet TAKE 1 TABLET BY MOUTH EVERY DAY 90 tablet 0  . TiZANidine HCl (ZANAFLEX PO) Take by mouth.    . warfarin (COUMADIN) 10 MG tablet Take 1 tablet daily except 1/2 tablet on Thursdays (Patient taking differently: Take 5-10 mg by mouth See admin instructions. TAKES 5MG  ON TUESDAYS AND THURSDAYS AND TAKES 10MG  ON ALL OTHER DAYS) 30 tablet 3   No current facility-administered medications for this visit.    Allergies:  Metoclopramide hcl; Penicillins; Metoprolol; and Statins   Social History: The patient  reports that she has been smoking e-cigarettes.  She started smoking about 40 years ago. She has a 43.00 pack-year smoking history. She has never used smokeless tobacco. She reports that she does not drink alcohol or use drugs.   ROS:  Please see the history of present illness. Otherwise, complete review of systems is positive for chronic back pain.  All other systems are reviewed and negative.   Physical Exam: VS:  BP 104/71   Pulse 70   Ht 5\' 7"  (1.702 m)   Wt 226 lb (102.5 kg)   SpO2 97%   BMI 35.40 kg/m , BMI Body mass index is 35.4 kg/m.  Wt Readings from Last 3 Encounters:  06/25/18 226 lb (102.5 kg)  11/18/17 239 lb (108.4 kg)  07/03/17 222 lb (100.7 kg)    General: Patient appears comfortable at rest. HEENT: Conjunctiva and lids normal, oropharynx clear. Neck: Supple, no elevated JVP or carotid bruits, no thyromegaly. Lungs: Clear to auscultation,  nonlabored breathing at rest. Cardiac: Regular rate and rhythm, no S3 or significant systolic murmur, no pericardial rub. Abdomen: Soft, nontender, bowel sounds present. Extremities: Mild ankle edema, distal pulses 2+. Skin: Warm and dry. Musculoskeletal: No kyphosis. Neuropsychiatric: Alert and oriented x3, affect grossly appropriate.  ECG: I personally reviewed the tracing from 06/19/2017 which showed sinus rhythm with IVCD and nonspecific ST changes.  Recent Labwork:    Component Value Date/Time   CHOL 147 06/19/2017 0226   TRIG 97 06/19/2017 0226   HDL 31 (L) 06/19/2017 0226   CHOLHDL 4.7 06/19/2017 0226   VLDL 19 06/19/2017 0226   LDLCALC 97 06/19/2017 0226  Other Studies Reviewed Today:  Echocardiogram 06/19/2017: Left ventricle:  LVEF is approximately 30% with severe hypokinesis / akinesis fo the inferior , distal anterior, and apical walls The cavity size was severely dilated. Wall thickness was normal. Doppler parameters are consistent with abnormal left ventricular relaxation (grade 1 diastolic dysfunction).  ------------------------------------------------------------------- Aortic valve:   Structurally normal valve.   Cusp separation was normal.  Doppler:  Transvalvular velocity was within the normal range. There was no stenosis. There was no regurgitation.  ------------------------------------------------------------------- Mitral valve:   Structurally normal valve.   Leaflet separation was normal.  Doppler:  Transvalvular velocity was within the normal range. There was no evidence for stenosis. There was mild regurgitation.    Peak gradient (D): 3 mm Hg.  ------------------------------------------------------------------- Left atrium:  The atrium was mildly dilated.  ------------------------------------------------------------------- Right ventricle:  The cavity size was normal. Wall thickness was normal. Pacer wire or catheter noted in right ventricle.  Systolic function was normal.  ------------------------------------------------------------------- Tricuspid valve:   Structurally normal valve.   Leaflet separation was normal.  Doppler:  Transvalvular velocity was within the normal range. There was no regurgitation.  ------------------------------------------------------------------- Right atrium:  The atrium was normal in size.  ------------------------------------------------------------------- Pericardium:  There was no pericardial effusion.  ------------------------------------------------------------------- Systemic veins: Inferior vena cava: The vessel was mildly dilated. The respirophasic diameter changes were in the normal range (= 50%), consistent with elevated central venous pressure.  Assessment and Plan:  1.  Ischemic cardiomyopathy with LVEF approximately 30%.  Plan is to continue current regimen.  She has only been able to tolerate very low-dose Cozaar and therefore we have not attempted Entresto.  She tends to run low blood pressures.  Lab work obtained per Dr. [name] with results pending.  2.  CAD with stable intermittent angina symptoms.  3.  Bilateral carotid artery disease with known occlusion of the RICA.  We will continue low-dose aspirin.  4.  St. Jude ICD in place.  No device shocks or syncope.  5.  Hypercoagulable state with anticardiolipin antibody.  She remains on chronic Coumadin.  Current medicines were reviewed with the patient today.   Orders Placed This Encounter  Procedures  . EKG 12-Lead    Disposition: Follow-up in 6 months.  Signed, [name], MD, [facility] 06/25/2018 2:20 PM    [facility] [address] [place], [place], [place] [address] Phone: [phone]; Fax: [phone]

## 2018-07-01 ENCOUNTER — Encounter: Payer: Self-pay | Admitting: Cardiology

## 2018-07-08 ENCOUNTER — Telehealth: Payer: Self-pay | Admitting: Cardiology

## 2018-07-08 ENCOUNTER — Encounter: Payer: Self-pay | Admitting: *Deleted

## 2018-07-08 ENCOUNTER — Other Ambulatory Visit: Payer: Self-pay | Admitting: Cardiology

## 2018-07-08 NOTE — Telephone Encounter (Signed)
Patient stated that she saw Dr. [name] on 06/24/2018.  Stated that she always gets tick bites [place].  Got to feeling much worse with recent tick bite with fatigue & nausea.  PMD did labs & did test positive to alpha gal.  Will send note to pmd to request most recent OV & labs.

## 2018-07-08 NOTE — Telephone Encounter (Signed)
Dr [name] diagnosed her with tick disease where she cant eat red meat, pork, etc.... She would like to know if it will affect her heart issues

## 2018-07-09 ENCOUNTER — Ambulatory Visit: Payer: Medicare PPO | Admitting: Cardiology

## 2018-07-09 NOTE — Telephone Encounter (Signed)
Patient notified and verbalized understanding. 

## 2018-07-09 NOTE — Telephone Encounter (Signed)
Notes received & placed in Dr. Diona Browner box for review.

## 2018-07-09 NOTE — Telephone Encounter (Signed)
No change in current cardiac regimen.

## 2018-07-13 ENCOUNTER — Telehealth (HOSPITAL_COMMUNITY): Payer: Self-pay

## 2018-07-13 NOTE — Telephone Encounter (Signed)
Called to schedule f/u cta head/neck. Pt doesn't not want to schedule at this time because she doesn't have the money. She will call if she decides to schedule. She also stated that she doesn't really want to do f/u's anymore. She had discussed this with Dr. Corliss Skains previously. She will call if she changes her mind or has sx. AW

## 2018-07-19 ENCOUNTER — Other Ambulatory Visit: Payer: Self-pay | Admitting: Cardiology

## 2018-07-30 ENCOUNTER — Other Ambulatory Visit: Payer: Self-pay | Admitting: Cardiology

## 2018-08-10 ENCOUNTER — Ambulatory Visit (INDEPENDENT_AMBULATORY_CARE_PROVIDER_SITE_OTHER): Payer: Medicare PPO | Admitting: *Deleted

## 2018-08-10 DIAGNOSIS — I5042 Chronic combined systolic (congestive) and diastolic (congestive) heart failure: Secondary | ICD-10-CM

## 2018-08-10 DIAGNOSIS — I255 Ischemic cardiomyopathy: Secondary | ICD-10-CM | POA: Diagnosis not present

## 2018-08-10 LAB — CUP PACEART INCLINIC DEVICE CHECK
Battery Remaining Longevity: 126 mo
Brady Statistic RA Percent Paced: 1 % — CL
Brady Statistic RV Percent Paced: 1 % — CL
Date Time Interrogation Session: 20190812160938
HighPow Impedance: 58 Ohm
Implantable Lead Implant Date: 20081119
Implantable Lead Implant Date: 20081119
Implantable Lead Location: 753859
Implantable Lead Location: 753860
Implantable Lead Model: 185
Implantable Lead Model: 4136
Implantable Lead Serial Number: 211124
Implantable Lead Serial Number: 28383796
Implantable Pulse Generator Implant Date: 20170303
Lead Channel Impedance Value: 536 Ohm
Lead Channel Impedance Value: 584 Ohm
Lead Channel Pacing Threshold Amplitude: 0.5 V
Lead Channel Pacing Threshold Amplitude: 1.2 V
Lead Channel Pacing Threshold Pulse Width: 0.5 ms
Lead Channel Pacing Threshold Pulse Width: 0.5 ms
Lead Channel Sensing Intrinsic Amplitude: 1.6 mV
Lead Channel Sensing Intrinsic Amplitude: 25 mV
Lead Channel Setting Pacing Amplitude: 2 V
Lead Channel Setting Pacing Amplitude: 2.4 V
Lead Channel Setting Pacing Pulse Width: 0.5 ms
Lead Channel Setting Sensing Sensitivity: 0.6 mV
Pulse Gen Serial Number: 204398

## 2018-08-10 NOTE — Progress Notes (Signed)
ICD check in clinic. Normal device function. Thresholds and sensing consistent with previous device measurements. Impedance trends stable over time. (16) ventricular arrhythmias since 02/12/17, (1) treated "successfully" w/ATP on 07/08/17 (no EGM). (1) mode switch episode x 3sec. Histogram distribution appropriate for patient and level of activity. No changes made this session. Device programmed at appropriate safety margins. Device programmed to optimize intrinsic conduction. Estimated longevity 10.5 years. Pt will follow up with GT/R first available. Patient education completed including shock plan.

## 2018-08-11 ENCOUNTER — Ambulatory Visit (INDEPENDENT_AMBULATORY_CARE_PROVIDER_SITE_OTHER): Payer: Medicare PPO | Admitting: *Deleted

## 2018-08-11 DIAGNOSIS — I63549 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: Secondary | ICD-10-CM

## 2018-08-11 DIAGNOSIS — D6859 Other primary thrombophilia: Secondary | ICD-10-CM

## 2018-08-11 DIAGNOSIS — Z5181 Encounter for therapeutic drug level monitoring: Secondary | ICD-10-CM

## 2018-08-11 DIAGNOSIS — D6861 Antiphospholipid syndrome: Secondary | ICD-10-CM

## 2018-08-11 LAB — POCT INR: INR: 6.2 — AB (ref 2.0–3.0)

## 2018-08-11 NOTE — Patient Instructions (Signed)
Hold coumadin x 3 days (T,W,Th) then resume 1 tablet daily except 1/2 tablet on Mondays, Wednesdays and Fridays Has been diagnosed with Alpha Gal.  Has lost about 30 pounds. Took pain pill last night. Keep greens consistant Recheck in 1 week

## 2018-09-29 ENCOUNTER — Ambulatory Visit (INDEPENDENT_AMBULATORY_CARE_PROVIDER_SITE_OTHER): Payer: Medicare PPO | Admitting: *Deleted

## 2018-09-29 DIAGNOSIS — D6859 Other primary thrombophilia: Secondary | ICD-10-CM

## 2018-09-29 DIAGNOSIS — Z5181 Encounter for therapeutic drug level monitoring: Secondary | ICD-10-CM | POA: Diagnosis not present

## 2018-09-29 DIAGNOSIS — I63549 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: Secondary | ICD-10-CM

## 2018-09-29 LAB — POCT INR: INR: 2.3 (ref 2.0–3.0)

## 2018-09-29 NOTE — Patient Instructions (Signed)
Continue coumadin 1 tablet daily except 1/2 tablet on Mondays, Wednesdays and Fridays Has been diagnosed with Alpha Gal.   Keep greens consistant Recheck in 4 weeks 

## 2018-10-01 DIAGNOSIS — M545 Low back pain: Secondary | ICD-10-CM | POA: Diagnosis not present

## 2018-10-01 DIAGNOSIS — Z23 Encounter for immunization: Secondary | ICD-10-CM | POA: Diagnosis not present

## 2018-10-01 DIAGNOSIS — I110 Hypertensive heart disease with heart failure: Secondary | ICD-10-CM | POA: Diagnosis not present

## 2018-10-01 DIAGNOSIS — I25119 Atherosclerotic heart disease of native coronary artery with unspecified angina pectoris: Secondary | ICD-10-CM | POA: Diagnosis not present

## 2018-10-01 DIAGNOSIS — Z95810 Presence of automatic (implantable) cardiac defibrillator: Secondary | ICD-10-CM | POA: Diagnosis not present

## 2018-10-05 ENCOUNTER — Ambulatory Visit (INDEPENDENT_AMBULATORY_CARE_PROVIDER_SITE_OTHER): Payer: Medicare PPO | Admitting: Otolaryngology

## 2018-10-05 DIAGNOSIS — H6123 Impacted cerumen, bilateral: Secondary | ICD-10-CM | POA: Diagnosis not present

## 2018-10-29 ENCOUNTER — Ambulatory Visit (INDEPENDENT_AMBULATORY_CARE_PROVIDER_SITE_OTHER): Payer: Medicare PPO | Admitting: *Deleted

## 2018-10-29 DIAGNOSIS — I63549 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: Secondary | ICD-10-CM | POA: Diagnosis not present

## 2018-10-29 DIAGNOSIS — D6861 Antiphospholipid syndrome: Secondary | ICD-10-CM | POA: Diagnosis not present

## 2018-10-29 DIAGNOSIS — D6859 Other primary thrombophilia: Secondary | ICD-10-CM

## 2018-10-29 DIAGNOSIS — Z5181 Encounter for therapeutic drug level monitoring: Secondary | ICD-10-CM

## 2018-10-29 LAB — POCT INR: INR: 2.8 (ref 2.0–3.0)

## 2018-10-29 NOTE — Patient Instructions (Signed)
Continue coumadin 1 tablet daily except 1/2 tablet on Mondays, Wednesdays and Fridays Has been diagnosed with Alpha Gal.   Keep greens consistant Recheck in 4 weeks 

## 2018-10-30 ENCOUNTER — Encounter: Payer: Medicare PPO | Admitting: Internal Medicine

## 2018-10-30 DIAGNOSIS — Z95810 Presence of automatic (implantable) cardiac defibrillator: Secondary | ICD-10-CM | POA: Diagnosis not present

## 2018-10-30 DIAGNOSIS — M25532 Pain in left wrist: Secondary | ICD-10-CM | POA: Diagnosis not present

## 2018-10-30 DIAGNOSIS — S301XXA Contusion of abdominal wall, initial encounter: Secondary | ICD-10-CM | POA: Diagnosis not present

## 2018-10-30 DIAGNOSIS — S0511XA Contusion of eyeball and orbital tissues, right eye, initial encounter: Secondary | ICD-10-CM | POA: Diagnosis not present

## 2018-10-30 DIAGNOSIS — M542 Cervicalgia: Secondary | ICD-10-CM | POA: Diagnosis not present

## 2018-10-30 DIAGNOSIS — S0083XA Contusion of other part of head, initial encounter: Secondary | ICD-10-CM | POA: Diagnosis not present

## 2018-10-30 DIAGNOSIS — S199XXA Unspecified injury of neck, initial encounter: Secondary | ICD-10-CM | POA: Diagnosis not present

## 2018-10-30 DIAGNOSIS — S300XXA Contusion of lower back and pelvis, initial encounter: Secondary | ICD-10-CM | POA: Diagnosis not present

## 2018-10-30 DIAGNOSIS — W1839XA Other fall on same level, initial encounter: Secondary | ICD-10-CM | POA: Diagnosis not present

## 2018-10-30 DIAGNOSIS — S0003XA Contusion of scalp, initial encounter: Secondary | ICD-10-CM | POA: Diagnosis not present

## 2018-10-30 DIAGNOSIS — K8020 Calculus of gallbladder without cholecystitis without obstruction: Secondary | ICD-10-CM | POA: Diagnosis not present

## 2018-10-30 DIAGNOSIS — I509 Heart failure, unspecified: Secondary | ICD-10-CM | POA: Diagnosis not present

## 2018-10-30 DIAGNOSIS — Z7901 Long term (current) use of anticoagulants: Secondary | ICD-10-CM | POA: Diagnosis not present

## 2018-10-30 DIAGNOSIS — Z981 Arthrodesis status: Secondary | ICD-10-CM | POA: Diagnosis not present

## 2018-11-03 DIAGNOSIS — I10 Essential (primary) hypertension: Secondary | ICD-10-CM | POA: Diagnosis not present

## 2018-11-03 DIAGNOSIS — Z7901 Long term (current) use of anticoagulants: Secondary | ICD-10-CM | POA: Diagnosis not present

## 2018-11-03 DIAGNOSIS — M545 Low back pain: Secondary | ICD-10-CM | POA: Diagnosis not present

## 2018-11-03 DIAGNOSIS — I25119 Atherosclerotic heart disease of native coronary artery with unspecified angina pectoris: Secondary | ICD-10-CM | POA: Diagnosis not present

## 2018-11-03 DIAGNOSIS — S0510XA Contusion of eyeball and orbital tissues, unspecified eye, initial encounter: Secondary | ICD-10-CM | POA: Diagnosis not present

## 2018-11-30 ENCOUNTER — Other Ambulatory Visit: Payer: Self-pay | Admitting: Cardiology

## 2018-11-30 NOTE — Progress Notes (Signed)
Cardiology Office Note Date:  12/02/2018  Patient ID:  [name] [name] 08-15-1961, MRN [id number] PCP:  Kari Baars, MD  Cardiologist:  Dr. Diona Browner Electrophysiologist: Dr. Ladona Ridgel    Chief Complaint: annual EP visit  History of Present Illness: [name] [name] is a 57 y.o. female with history of CAD, HLD, ICM, chronic CHF (systolic), VT,  ICD, PVD, CVA, Raynaud's, Anticardiolipin antibody positive, chronically on warfarin, h/o cocaine abuse, and PVD with known occluded R ICA.  Most recently she saw Dr. Diona Browner in June of this year.  He mentioned she was following with the coumadin clinic.  Discussion regarding trying Entresto though SBP tends to get into the 90's and did not think she would tolerate. She had labs done with her PMD.  No changes were made at that visit. She comes in today to be seen for Dr. Ladona Ridgel. She last saw him Feb 2018.  She was doing well at that time, no changes were made.  She is doing well.  No CP, palpitations or SOB, no symptoms of PND or orthopnea, no near syncope or syncope.  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  She manages her warfarin with the coumadin clinic in [place].  She was at a [employer] where she was knocked down by  [employer] hitting her head on a railing.  She suffered forehead trauma, was seen in the ER with a large hematoma, reports having CT scans without intracranial trauma, though her wound is still not completed healed.  She has had serial CTs (3 total she states, all OK), no sutured were placed.  They held her warfarin for 2 days.  Her PMD has been watching it, she sees him later today.  She tells me she is scheduled for labs/visit with dr. Diona Browner in 2 weeks.  Device information: BSCi dual chamber ICD, implanted 11/18/07, gen change 03/01/16 + hx of ICD therapy, Oct 2017 (apparebtly occurred with the passing of her father, I am unable to find details)  Past Medical History:  Diagnosis Date  . Aneurysm (HCC)    internal carotid artery  . Anticardiolipin antibody positive    Chronic Coumadin  . Chronic combined systolic (congestive) and diastolic (congestive) heart failure (HCC)   . Cocaine abuse in remission (HCC)   . Coronary atherosclerosis of native coronary artery    Previous invasive cardiac testing was done at a facility in [place], [place].  Occluded diagonal, Diffuse LAD disease, Occluded Cx, and non obstructive RCA.   . Essential hypertension, benign   . Headache   . History of pneumonia   . Ischemic cardiomyopathy    LVEF 30-35%  . Mixed hyperlipidemia   . PVD (peripheral vascular disease) (HCC)   . Raynaud's phenomenon   . Stroke [facility]) 2007   Total occlusion of the right internal carotid    Past Surgical History:  Procedure Laterality Date  . ABDOMINAL AORTAGRAM N/A 05/25/2014   Procedure: ABDOMINAL Ronny Flurry;  Surgeon: Iran Ouch, MD;  Location: [facility] CATH LAB;  Service: Cardiovascular;  Laterality: N/A;  . APPENDECTOMY    . CARDIAC DEFIBRILLATOR PLACEMENT     St.Jude ICD  . EP IMPLANTABLE DEVICE N/A 03/01/2016   Procedure: ICD Generator Changeout;  Surgeon: [name], MD;  Location: [facility] INVASIVE CV LAB;  Service: Cardiovascular;  Laterality: N/A;  . L1 corpectomy   12/10  . Laryngeal polyp excision      Current Outpatient Medications  Medication Sig Dispense Refill  . aspirin  EC 81 MG tablet Take 81 mg by mouth daily.    Marland Kitchen aspirin-acetaminophen-caffeine (EXCEDRIN MIGRAINE) 250-250-65 MG tablet Take 2 tablets by mouth once as needed for headache or migraine.    . carvedilol (COREG) 12.5 MG tablet TAKE 1 TABLET BY MOUTH TWICE DAILY WITH MEALS 180 tablet 3  . clonazePAM (KLONOPIN) 1 MG tablet Take 1 mg by mouth 4 (four) times daily as needed for anxiety.    . DULoxetine (CYMBALTA) 60 MG capsule Take 60 mg by mouth every morning.    . furosemide (LASIX) 40 MG tablet TAKE 1 TABLET BY MOUTH DAILY AS NEEDED FOR SWELLING. 30 tablet 6  . furosemide (LASIX) 80 MG tablet TAKE  1 TABLET BY MOUTH DAILY 90 tablet 1  . gabapentin (NEURONTIN) 300 MG capsule Take 300 mg by mouth 2 (two) times daily.    Marland Kitchen HYDROcodone-acetaminophen (NORCO) 10-325 MG per tablet Take 0.5 tablets by mouth 4 (four) times daily as needed for moderate pain or severe pain. pain    . isosorbide mononitrate (IMDUR) 30 MG 24 hr tablet TAKE 1 TABLET BY MOUTH DAILY 90 tablet 1  . losartan (COZAAR) 25 MG tablet TAKE 1/2 TABLET BY MOUTH EVERY DAY 45 tablet 1  . Multiple Vitamins-Minerals (MULTIVITAMINS THER. W/MINERALS) TABS Take 1 tablet by mouth daily.      . nicotine (NICODERM CQ - DOSED IN MG/24 HR) 7 mg/24hr patch Place 1 patch (7 mg total) onto the skin daily. 28 patch 0  . nitroGLYCERIN (NITROSTAT) 0.4 MG SL tablet PLACE 1 TABLET UNDER THE TONGUE EVERY 5 MINUTES FOR 3 DOSES AS NEEDED CHEST PAIN 25 tablet 3  . potassium chloride SA (K-DUR,KLOR-CON) 20 MEQ tablet TAKE 1 TABLET BY MOUTH EVERY DAY 90 tablet 3  . promethazine (PHENERGAN) 25 MG tablet Take 25 mg by mouth every 6 (six) hours as needed for nausea.    . ranitidine (ZANTAC) 150 MG tablet Take 150 mg by mouth 2 (two) times daily.    Marland Kitchen spironolactone (ALDACTONE) 25 MG tablet TAKE 1 TABLET BY MOUTH EVERY DAY 90 tablet 1  . TiZANidine HCl (ZANAFLEX PO) Take by mouth.    . warfarin (COUMADIN) 10 MG tablet Take 1 tablet daily except 1/2 tablet on Thursdays (Patient taking differently: Take 5-10 mg by mouth See admin instructions. TAKES 5MG  ON TUESDAYS AND THURSDAYS AND TAKES 10MG  ON ALL OTHER DAYS) 30 tablet 3   No current facility-administered medications for this visit.     Allergies:   Metoclopramide hcl; Penicillins; Metoprolol; and Statins   Social History:  The patient  reports that she has been smoking e-cigarettes. She started smoking about 40 years ago. She has a 43.00 pack-year smoking history. She has never used smokeless tobacco. She reports that she does not drink alcohol or use drugs.   Family History:  The patient's family history  includes Ankylosing spondylitis in her sister; Heart attack in her brother; Heart disease (age of onset: 43) in her father; Stomach cancer (age of onset: 63) in her brother.  ROS:  Please see the history of present illness.   All other systems are reviewed and otherwise negative.   PHYSICAL EXAM:  VS:  BP 114/78   Pulse 78   Ht 5\' 7"  (1.702 m)   Wt 225 lb (102.1 kg)   BMI 35.24 kg/m  BMI: Body mass index is 35.24 kg/m. Well nourished, well developed, in no acute distress  HEENT: normocephalic, atraumatic  Neck: no JVD, carotid bruits or masses Cardiac:  RRR; no significant murmurs, no rubs, or gallops Lungs:  CTA b/l, no wheezing, rhonchi or rales  Abd: soft, nontender MS: no deformity or atrophy Ext: no edema  Skin: warm and dry, no rash Neuro:  No gross deficits appreciated Psych: euthymic mood, full affect  ICD site is stable, no tethering or discomfort   EKG:  Not done today ICD interrogation done today and reviewed by myself:  Battery and lead testing are good, NSVT only noted, very brief ATR noted, therapies.  06/19/17: TTE Study Conclusions - Left ventricle: The cavity size was severely dilated. Wall   thickness was normal. Doppler parameters are consistent with   abnormal left ventricular relaxation (grade 1 diastolic   dysfunction). - Mitral valve: There was mild regurgitation. - Left atrium: The atrium was mildly dilated. LVEF is approximately 30% with severe hypokinesis / akinesis fo the inferior , distal anterior, and apical walls The cavity size was severely dilated  Recent Labs: No results found for requested labs within last 8760 hours.  No results found for requested labs within last 8760 hours.   CrCl cannot be calculated (Patient's most recent lab result is older than the maximum 21 days allowed.).   Wt Readings from Last 3 Encounters:  12/02/18 225 lb (102.1 kg)  06/25/18 226 lb (102.5 kg)  11/18/17 239 lb (108.4 kg)     Other studies  reviewed: Additional studies/records reviewed today include: summarized above  ASSESSMENT AND PLAN:  1. ICD     Intact function  2. ICM  3. Chronic CHF, class I     On BB/ARB, diuretic, rarely/infrequently uses her PRN lasix       4. CAD     No anginal symptoms     On ASA, BB, nitrate     Not on a statin, will defer to Dr. Diona Browner  5. HTN     Looks good   Disposition: F/u with Q 3 mo remotes, [employer] is sending her a new home transmitter, we will see her in 1 year in-clinic, sooner if needed.  Current medicines are reviewed at length with the patient today.  The patient did not have any concerns regarding medicines.  [name] Fredrickson, PA-C 12/02/2018 1:38 PM     [facility] [address] [place] [address] [phone] (office)  [phone] (fax)

## 2018-12-01 ENCOUNTER — Ambulatory Visit (INDEPENDENT_AMBULATORY_CARE_PROVIDER_SITE_OTHER): Payer: Medicare PPO | Admitting: *Deleted

## 2018-12-01 DIAGNOSIS — D6859 Other primary thrombophilia: Secondary | ICD-10-CM | POA: Diagnosis not present

## 2018-12-01 DIAGNOSIS — I63549 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: Secondary | ICD-10-CM

## 2018-12-01 DIAGNOSIS — Z5181 Encounter for therapeutic drug level monitoring: Secondary | ICD-10-CM | POA: Diagnosis not present

## 2018-12-01 DIAGNOSIS — D6861 Antiphospholipid syndrome: Secondary | ICD-10-CM | POA: Diagnosis not present

## 2018-12-01 LAB — POCT INR: INR: 2.5 (ref 2.0–3.0)

## 2018-12-01 NOTE — Patient Instructions (Signed)
Continue coumadin 1 tablet daily except 1/2 tablet on Mondays, Wednesdays and Fridays Has been diagnosed with Alpha Gal.   Keep greens consistant Recheck in 4 weeks 

## 2018-12-02 ENCOUNTER — Ambulatory Visit: Payer: Medicare PPO | Admitting: Physician Assistant

## 2018-12-02 VITALS — BP 114/78 | HR 78 | Ht 67.0 in | Wt 225.0 lb

## 2018-12-02 DIAGNOSIS — I10 Essential (primary) hypertension: Secondary | ICD-10-CM

## 2018-12-02 DIAGNOSIS — I255 Ischemic cardiomyopathy: Secondary | ICD-10-CM

## 2018-12-02 DIAGNOSIS — I5022 Chronic systolic (congestive) heart failure: Secondary | ICD-10-CM | POA: Diagnosis not present

## 2018-12-02 DIAGNOSIS — I2510 Atherosclerotic heart disease of native coronary artery without angina pectoris: Secondary | ICD-10-CM

## 2018-12-02 DIAGNOSIS — Z95810 Presence of automatic (implantable) cardiac defibrillator: Secondary | ICD-10-CM

## 2018-12-02 NOTE — Patient Instructions (Addendum)
Medication Instructions:  Your physician recommends that you continue on your current medications as directed. Please refer to the Current Medication list given to you today.  If you need a refill on your cardiac medications before your next appointment, please call your pharmacy.   Lab work:  NONE ORDERED  TODAY  If you have labs (blood work) drawn today and your tests are completely normal, you will receive your results only by: Marland Kitchen MyChart Message (if you have MyChart) OR . A paper copy in the mail If you have any lab test that is abnormal or we need to change your treatment, we will call you to review the results.  Testing/Procedures:  NONE ORDERED  TODAY   Follow-Up: At [facility], you and your health needs are our priority.  As part of our continuing mission to provide you with exceptional heart care, we have created designated Provider Care Teams.  These Care Teams include your primary Cardiologist (physician) and Advanced Practice Providers (APPs -  Physician Assistants and Nurse Practitioners) who all work together to provide you with the care you need, when you need it. You will need a follow up appointment in 1 years.  Please call our office 2 months in advance to schedule this appointment.  You may see Dr. Ladona Ridgel  or one of the following Advanced Practice Providers on your designated Care Team:   Gypsy Balsam, NP . Francis Dowse, PA-C  Remote monitoring is used to monitor your Pacemaker of ICD from home. This monitoring reduces the number of office visits required to check your device to one time per year. It allows [place] to keep an eye on the functioning of your device to ensure it is working properly. You are scheduled for a device check from home on . 3-4-2020You may send your transmission at any time that day. If you have a wireless device, the transmission will be sent automatically. After your physician reviews your transmission, you will receive a postcard with your next  transmission date.    Any Other Special Instructions Will Be Listed Below (If Applicable).

## 2018-12-15 NOTE — Progress Notes (Deleted)
Cardiology Office Note  Date: 12/15/2018   ID: [name] [name], DOB [birth year], MRN [id number]  PCP: Kari Baars, MD  Primary Cardiologist: Nona Dell, MD   No chief complaint on file.   History of Present Illness: [name] [name] is a 57 y.o. female last seen in June.  She remains on Coumadin with follow-up in anticoagulation clinic.  She sees Dr. Ladona Ridgel in the device clinic, St. Jude ICD in place.  Past Medical History:  Diagnosis Date  . Aneurysm (HCC)    internal carotid artery  . Anticardiolipin antibody positive    Chronic Coumadin  . Chronic combined systolic (congestive) and diastolic (congestive) heart failure (HCC)   . Cocaine abuse in remission (HCC)   . Coronary atherosclerosis of native coronary artery    Previous invasive cardiac testing was done at a facility in [place], [place].  Occluded diagonal, Diffuse LAD disease, Occluded Cx, and non obstructive RCA.   . Essential hypertension, benign   . Headache   . History of pneumonia   . Ischemic cardiomyopathy    LVEF 30-35%  . Mixed hyperlipidemia   . PVD (peripheral vascular disease) (HCC)   . Raynaud's phenomenon   . Stroke [facility]) 2007   Total occlusion of the right internal carotid    Past Surgical History:  Procedure Laterality Date  . ABDOMINAL AORTAGRAM N/A 05/25/2014   Procedure: ABDOMINAL Ronny Flurry;  Surgeon: Iran Ouch, MD;  Location: [facility] CATH LAB;  Service: Cardiovascular;  Laterality: N/A;  . APPENDECTOMY    . CARDIAC DEFIBRILLATOR PLACEMENT     [employer]Jude ICD  . EP IMPLANTABLE DEVICE N/A 03/01/2016   Procedure: ICD Generator Changeout;  Surgeon: [name], MD;  Location: [facility] INVASIVE CV LAB;  Service: Cardiovascular;  Laterality: N/A;  . L1 corpectomy   12/10  . Laryngeal polyp excision      Current Outpatient Medications  Medication Sig Dispense Refill  . aspirin EC 81 MG tablet Take 81 mg by mouth daily.    Marland Kitchen aspirin-acetaminophen-caffeine (EXCEDRIN MIGRAINE) 250-250-65  MG tablet Take 2 tablets by mouth once as needed for headache or migraine.    . carvedilol (COREG) 12.5 MG tablet TAKE 1 TABLET BY MOUTH TWICE DAILY WITH MEALS 180 tablet 3  . clonazePAM (KLONOPIN) 1 MG tablet Take 1 mg by mouth 4 (four) times daily as needed for anxiety.    . DULoxetine (CYMBALTA) 60 MG capsule Take 60 mg by mouth every morning.    . furosemide (LASIX) 40 MG tablet TAKE 1 TABLET BY MOUTH DAILY AS NEEDED FOR SWELLING. 30 tablet 6  . furosemide (LASIX) 80 MG tablet TAKE 1 TABLET BY MOUTH DAILY 90 tablet 1  . gabapentin (NEURONTIN) 300 MG capsule Take 300 mg by mouth 2 (two) times daily.    Marland Kitchen HYDROcodone-acetaminophen (NORCO) 10-325 MG per tablet Take 0.5 tablets by mouth 4 (four) times daily as needed for moderate pain or severe pain. pain    . isosorbide mononitrate (IMDUR) 30 MG 24 hr tablet TAKE 1 TABLET BY MOUTH DAILY 90 tablet 1  . losartan (COZAAR) 25 MG tablet TAKE 1/2 TABLET BY MOUTH EVERY DAY 45 tablet 1  . Multiple Vitamins-Minerals (MULTIVITAMINS THER. W/MINERALS) TABS Take 1 tablet by mouth daily.      . nicotine (NICODERM CQ - DOSED IN MG/24 HR) 7 mg/24hr patch Place 1 patch (7 mg total) onto the skin daily. 28 patch 0  . nitroGLYCERIN (NITROSTAT) 0.4 MG SL tablet PLACE 1 TABLET UNDER THE  TONGUE EVERY 5 MINUTES FOR 3 DOSES AS NEEDED CHEST PAIN 25 tablet 3  . potassium chloride SA (K-DUR,KLOR-CON) 20 MEQ tablet TAKE 1 TABLET BY MOUTH EVERY DAY 90 tablet 3  . promethazine (PHENERGAN) 25 MG tablet Take 25 mg by mouth every 6 (six) hours as needed for nausea.    . ranitidine (ZANTAC) 150 MG tablet Take 150 mg by mouth 2 (two) times daily.    Marland Kitchen spironolactone (ALDACTONE) 25 MG tablet TAKE 1 TABLET BY MOUTH EVERY DAY 90 tablet 1  . TiZANidine HCl (ZANAFLEX PO) Take by mouth.    . warfarin (COUMADIN) 10 MG tablet Take 1 tablet daily except 1/2 tablet on Thursdays (Patient taking differently: Take 5-10 mg by mouth See admin instructions. TAKES 5MG  ON TUESDAYS AND THURSDAYS AND  TAKES 10MG  ON ALL OTHER DAYS) 30 tablet 3   No current facility-administered medications for this visit.    Allergies:  Metoclopramide hcl; Penicillins; Metoprolol; and Statins   Social History: The patient  reports that she has been smoking e-cigarettes. She started smoking about 40 years ago. She has a 43.00 pack-year smoking history. She has never used smokeless tobacco. She reports that she does not drink alcohol or use drugs.   Family History: The patient's family history includes Ankylosing spondylitis in her sister; Heart attack in her brother; Heart disease (age of onset: 24) in her father; Stomach cancer (age of onset: 5) in her brother.   ROS:  Please see the history of present illness. Otherwise, complete review of systems is positive for {NONE DEFAULTED:18576::"none"}.  All other systems are reviewed and negative.   Physical Exam: VS:  There were no vitals taken for this visit., BMI There is no height or weight on file to calculate BMI.  Wt Readings from Last 3 Encounters:  12/02/18 225 lb (102.1 kg)  06/25/18 226 lb (102.5 kg)  11/18/17 239 lb (108.4 kg)    General: Patient appears comfortable at rest. HEENT: Conjunctiva and lids normal, oropharynx clear with moist mucosa. Neck: Supple, no elevated JVP or carotid bruits, no thyromegaly. Lungs: Clear to auscultation, nonlabored breathing at rest. Cardiac: Regular rate and rhythm, no S3 or significant systolic murmur, no pericardial rub. Abdomen: Soft, nontender, no hepatomegaly, bowel sounds present, no guarding or rebound. Extremities: No pitting edema, distal pulses 2+. Skin: Warm and dry. Musculoskeletal: No kyphosis. Neuropsychiatric: Alert and oriented x3, affect grossly appropriate.  ECG: I personally reviewed the tracing from 06/25/2018 which showed sinus rhythm.  Recent Labwork:    Component Value Date/Time   CHOL 147 06/19/2017 0226   TRIG 97 06/19/2017 0226   HDL 31 (L) 06/19/2017 0226   CHOLHDL 4.7  06/19/2017 0226   VLDL 19 06/19/2017 0226   LDLCALC 97 06/19/2017 0226  June 2019: Hemoglobin 14.1, platelets 142, BUN 11, creatinine 0.73, potassium 4.9, AST 24, ALT 23, cholesterol 208, triglycerides 105, HDL 42, LDL 145  Other Studies Reviewed Today:  Echocardiogram 06/19/2017: Left ventricle: LVEF is approximately 30% with severe hypokinesis / akinesis fo the inferior , distal anterior, and apical walls The cavity size was severely dilated. Wall thickness was normal. Doppler parameters are consistent with abnormal left ventricular relaxation (grade 1 diastolic dysfunction).  ------------------------------------------------------------------- Aortic valve: Structurally normal valve. Cusp separation was normal. Doppler: Transvalvular velocity was within the normal range. There was no stenosis. There was no regurgitation.  ------------------------------------------------------------------- Mitral valve: Structurally normal valve. Leaflet separation was normal. Doppler: Transvalvular velocity was within the normal range. There was no evidence for stenosis.  There was mild regurgitation. Peak gradient (D): 3 mm Hg.  ------------------------------------------------------------------- Left atrium: The atrium was mildly dilated.  ------------------------------------------------------------------- Right ventricle: The cavity size was normal. Wall thickness was normal. Pacer wire or catheter noted in right ventricle. Systolic function was normal.  ------------------------------------------------------------------- Tricuspid valve: Structurally normal valve. Leaflet separation was normal. Doppler: Transvalvular velocity was within the normal range. There was no regurgitation.  ------------------------------------------------------------------- Right atrium: The atrium was normal in  size.  ------------------------------------------------------------------- Pericardium: There was no pericardial effusion.  ------------------------------------------------------------------- Systemic veins: Inferior vena cava: The vessel was mildly dilated. The respirophasic diameter changes were in the normal range (= 50%), consistent with elevated central venous pressure.  Assessment and Plan:    Current medicines were reviewed with the patient today.  No orders of the defined types were placed in this encounter.   Disposition:  Signed, [name], MD, [facility] 12/15/2018 10:01 AM    [facility] [address] [place], [place], [place] [address] Phone: [phone]; Fax: [phone]

## 2018-12-16 ENCOUNTER — Ambulatory Visit: Payer: Medicare PPO | Admitting: Cardiology

## 2018-12-20 ENCOUNTER — Other Ambulatory Visit: Payer: Self-pay | Admitting: Cardiology

## 2018-12-23 ENCOUNTER — Inpatient Hospital Stay (HOSPITAL_COMMUNITY)
Admission: EM | Admit: 2018-12-23 | Discharge: 2018-12-26 | DRG: 194 | Disposition: A | Discharge status: Home / Self Care | Payer: Medicare PPO | Attending: Pulmonary Disease | Admitting: Pulmonary Disease

## 2018-12-23 ENCOUNTER — Encounter (HOSPITAL_COMMUNITY): Payer: Self-pay

## 2018-12-23 ENCOUNTER — Other Ambulatory Visit: Payer: Self-pay

## 2018-12-23 ENCOUNTER — Emergency Department (HOSPITAL_COMMUNITY): Payer: Medicare PPO

## 2018-12-23 DIAGNOSIS — Z8673 Personal history of transient ischemic attack (TIA), and cerebral infarction without residual deficits: Secondary | ICD-10-CM

## 2018-12-23 DIAGNOSIS — I509 Heart failure, unspecified: Secondary | ICD-10-CM | POA: Diagnosis not present

## 2018-12-23 DIAGNOSIS — I7300 Raynaud's syndrome without gangrene: Secondary | ICD-10-CM | POA: Diagnosis present

## 2018-12-23 DIAGNOSIS — D696 Thrombocytopenia, unspecified: Secondary | ICD-10-CM | POA: Diagnosis present

## 2018-12-23 DIAGNOSIS — D6861 Antiphospholipid syndrome: Secondary | ICD-10-CM | POA: Diagnosis present

## 2018-12-23 DIAGNOSIS — Z86010 Personal history of colonic polyps: Secondary | ICD-10-CM

## 2018-12-23 DIAGNOSIS — F1411 Cocaine abuse, in remission: Secondary | ICD-10-CM | POA: Diagnosis present

## 2018-12-23 DIAGNOSIS — R05 Cough: Secondary | ICD-10-CM | POA: Diagnosis not present

## 2018-12-23 DIAGNOSIS — I679 Cerebrovascular disease, unspecified: Secondary | ICD-10-CM | POA: Diagnosis present

## 2018-12-23 DIAGNOSIS — J111 Influenza due to unidentified influenza virus with other respiratory manifestations: Secondary | ICD-10-CM | POA: Diagnosis not present

## 2018-12-23 DIAGNOSIS — Z79891 Long term (current) use of opiate analgesic: Secondary | ICD-10-CM | POA: Diagnosis not present

## 2018-12-23 DIAGNOSIS — I255 Ischemic cardiomyopathy: Secondary | ICD-10-CM | POA: Diagnosis present

## 2018-12-23 DIAGNOSIS — I110 Hypertensive heart disease with heart failure: Secondary | ICD-10-CM | POA: Diagnosis present

## 2018-12-23 DIAGNOSIS — I4891 Unspecified atrial fibrillation: Secondary | ICD-10-CM | POA: Diagnosis present

## 2018-12-23 DIAGNOSIS — Z7901 Long term (current) use of anticoagulants: Secondary | ICD-10-CM

## 2018-12-23 DIAGNOSIS — E782 Mixed hyperlipidemia: Secondary | ICD-10-CM | POA: Diagnosis present

## 2018-12-23 DIAGNOSIS — K529 Noninfective gastroenteritis and colitis, unspecified: Secondary | ICD-10-CM | POA: Diagnosis present

## 2018-12-23 DIAGNOSIS — J101 Influenza due to other identified influenza virus with other respiratory manifestations: Secondary | ICD-10-CM | POA: Diagnosis present

## 2018-12-23 DIAGNOSIS — E871 Hypo-osmolality and hyponatremia: Secondary | ICD-10-CM | POA: Diagnosis present

## 2018-12-23 DIAGNOSIS — J09X1 Influenza due to identified novel influenza A virus with pneumonia: Secondary | ICD-10-CM | POA: Diagnosis not present

## 2018-12-23 DIAGNOSIS — F17290 Nicotine dependence, other tobacco product, uncomplicated: Secondary | ICD-10-CM | POA: Diagnosis present

## 2018-12-23 DIAGNOSIS — Z79899 Other long term (current) drug therapy: Secondary | ICD-10-CM

## 2018-12-23 DIAGNOSIS — I5042 Chronic combined systolic (congestive) and diastolic (congestive) heart failure: Secondary | ICD-10-CM | POA: Diagnosis present

## 2018-12-23 DIAGNOSIS — Z95810 Presence of automatic (implantable) cardiac defibrillator: Secondary | ICD-10-CM | POA: Diagnosis not present

## 2018-12-23 DIAGNOSIS — R0602 Shortness of breath: Secondary | ICD-10-CM | POA: Diagnosis not present

## 2018-12-23 DIAGNOSIS — Z8249 Family history of ischemic heart disease and other diseases of the circulatory system: Secondary | ICD-10-CM

## 2018-12-23 DIAGNOSIS — I739 Peripheral vascular disease, unspecified: Secondary | ICD-10-CM | POA: Diagnosis present

## 2018-12-23 DIAGNOSIS — B963 Hemophilus influenzae [H. influenzae] as the cause of diseases classified elsewhere: Secondary | ICD-10-CM | POA: Diagnosis present

## 2018-12-23 DIAGNOSIS — R0902 Hypoxemia: Secondary | ICD-10-CM | POA: Diagnosis present

## 2018-12-23 DIAGNOSIS — Z7982 Long term (current) use of aspirin: Secondary | ICD-10-CM

## 2018-12-23 DIAGNOSIS — Z888 Allergy status to other drugs, medicaments and biological substances status: Secondary | ICD-10-CM | POA: Diagnosis not present

## 2018-12-23 DIAGNOSIS — A492 Hemophilus influenzae infection, unspecified site: Secondary | ICD-10-CM | POA: Diagnosis present

## 2018-12-23 DIAGNOSIS — Z880 Allergy status to penicillin: Secondary | ICD-10-CM | POA: Diagnosis not present

## 2018-12-23 DIAGNOSIS — I5023 Acute on chronic systolic (congestive) heart failure: Secondary | ICD-10-CM | POA: Diagnosis not present

## 2018-12-23 DIAGNOSIS — I10 Essential (primary) hypertension: Secondary | ICD-10-CM | POA: Diagnosis present

## 2018-12-23 NOTE — ED Notes (Signed)
Pt states to have taken 2 nitro tabs on the 23rd for chest pain

## 2018-12-23 NOTE — ED Triage Notes (Signed)
Pt reports chest tightness and SOB that began on the 23rd, productive cough described as dry, hacking, and deep cough. Hx of CHF

## 2018-12-24 DIAGNOSIS — R0902 Hypoxemia: Secondary | ICD-10-CM | POA: Diagnosis present

## 2018-12-24 DIAGNOSIS — D696 Thrombocytopenia, unspecified: Secondary | ICD-10-CM | POA: Diagnosis present

## 2018-12-24 DIAGNOSIS — Z79891 Long term (current) use of opiate analgesic: Secondary | ICD-10-CM | POA: Diagnosis not present

## 2018-12-24 DIAGNOSIS — Z880 Allergy status to penicillin: Secondary | ICD-10-CM | POA: Diagnosis not present

## 2018-12-24 DIAGNOSIS — J101 Influenza due to other identified influenza virus with other respiratory manifestations: Secondary | ICD-10-CM | POA: Diagnosis present

## 2018-12-24 DIAGNOSIS — Z8673 Personal history of transient ischemic attack (TIA), and cerebral infarction without residual deficits: Secondary | ICD-10-CM | POA: Diagnosis not present

## 2018-12-24 DIAGNOSIS — I4891 Unspecified atrial fibrillation: Secondary | ICD-10-CM | POA: Diagnosis present

## 2018-12-24 DIAGNOSIS — B963 Hemophilus influenzae [H. influenzae] as the cause of diseases classified elsewhere: Secondary | ICD-10-CM | POA: Diagnosis present

## 2018-12-24 DIAGNOSIS — E782 Mixed hyperlipidemia: Secondary | ICD-10-CM | POA: Diagnosis present

## 2018-12-24 DIAGNOSIS — Z888 Allergy status to other drugs, medicaments and biological substances status: Secondary | ICD-10-CM | POA: Diagnosis not present

## 2018-12-24 DIAGNOSIS — Z7982 Long term (current) use of aspirin: Secondary | ICD-10-CM | POA: Diagnosis not present

## 2018-12-24 DIAGNOSIS — Z79899 Other long term (current) drug therapy: Secondary | ICD-10-CM | POA: Diagnosis not present

## 2018-12-24 DIAGNOSIS — F17290 Nicotine dependence, other tobacco product, uncomplicated: Secondary | ICD-10-CM | POA: Diagnosis present

## 2018-12-24 DIAGNOSIS — I255 Ischemic cardiomyopathy: Secondary | ICD-10-CM | POA: Diagnosis present

## 2018-12-24 DIAGNOSIS — Z8249 Family history of ischemic heart disease and other diseases of the circulatory system: Secondary | ICD-10-CM | POA: Diagnosis not present

## 2018-12-24 DIAGNOSIS — F1411 Cocaine abuse, in remission: Secondary | ICD-10-CM | POA: Diagnosis present

## 2018-12-24 DIAGNOSIS — D6861 Antiphospholipid syndrome: Secondary | ICD-10-CM | POA: Diagnosis present

## 2018-12-24 DIAGNOSIS — I5042 Chronic combined systolic (congestive) and diastolic (congestive) heart failure: Secondary | ICD-10-CM | POA: Diagnosis present

## 2018-12-24 DIAGNOSIS — Z86010 Personal history of colonic polyps: Secondary | ICD-10-CM | POA: Diagnosis not present

## 2018-12-24 DIAGNOSIS — A492 Hemophilus influenzae infection, unspecified site: Secondary | ICD-10-CM | POA: Diagnosis present

## 2018-12-24 DIAGNOSIS — I7300 Raynaud's syndrome without gangrene: Secondary | ICD-10-CM | POA: Diagnosis present

## 2018-12-24 DIAGNOSIS — Z95810 Presence of automatic (implantable) cardiac defibrillator: Secondary | ICD-10-CM | POA: Diagnosis not present

## 2018-12-24 DIAGNOSIS — I110 Hypertensive heart disease with heart failure: Secondary | ICD-10-CM | POA: Diagnosis present

## 2018-12-24 DIAGNOSIS — E871 Hypo-osmolality and hyponatremia: Secondary | ICD-10-CM | POA: Diagnosis present

## 2018-12-24 DIAGNOSIS — K529 Noninfective gastroenteritis and colitis, unspecified: Secondary | ICD-10-CM | POA: Diagnosis present

## 2018-12-24 LAB — CBC WITH DIFFERENTIAL/PLATELET
Abs Immature Granulocytes: 0.02 10*3/uL (ref 0.00–0.07)
Basophils Absolute: 0.1 10*3/uL (ref 0.0–0.1)
Basophils Relative: 1 %
Eosinophils Absolute: 0 10*3/uL (ref 0.0–0.5)
Eosinophils Relative: 0 %
HCT: 44.4 % (ref 36.0–46.0)
Hemoglobin: 14.3 g/dL (ref 12.0–15.0)
Immature Granulocytes: 0 %
Lymphocytes Relative: 13 %
Lymphs Abs: 0.8 10*3/uL (ref 0.7–4.0)
MCH: 28.5 pg (ref 26.0–34.0)
MCHC: 32.2 g/dL (ref 30.0–36.0)
MCV: 88.6 fL (ref 80.0–100.0)
Monocytes Absolute: 0.6 10*3/uL (ref 0.1–1.0)
Monocytes Relative: 9 %
Neutro Abs: 4.9 10*3/uL (ref 1.7–7.7)
Neutrophils Relative %: 77 %
Platelets: 50 10*3/uL — ABNORMAL LOW (ref 150–400)
RBC: 5.01 MIL/uL (ref 3.87–5.11)
RDW: 13.4 % (ref 11.5–15.5)
WBC: 6.3 10*3/uL (ref 4.0–10.5)
nRBC: 0 % (ref 0.0–0.2)

## 2018-12-24 LAB — PROTIME-INR
INR: 1.58
Prothrombin Time: 18.7 seconds — ABNORMAL HIGH (ref 11.4–15.2)

## 2018-12-24 LAB — COMPREHENSIVE METABOLIC PANEL
ALT: 28 U/L (ref 0–44)
AST: 34 U/L (ref 15–41)
Albumin: 3.9 g/dL (ref 3.5–5.0)
Alkaline Phosphatase: 58 U/L (ref 38–126)
Anion gap: 10 (ref 5–15)
BUN: 9 mg/dL (ref 6–20)
CO2: 23 mmol/L (ref 22–32)
Calcium: 8.5 mg/dL — ABNORMAL LOW (ref 8.9–10.3)
Chloride: 94 mmol/L — ABNORMAL LOW (ref 98–111)
Creatinine, Ser: 0.76 mg/dL (ref 0.44–1.00)
GFR calc Af Amer: >60 mL/min (ref >60)
GFR calc non Af Amer: >60 mL/min (ref >60)
Glucose, Bld: 98 mg/dL (ref 70–99)
Potassium: 3.6 mmol/L (ref 3.5–5.1)
Sodium: 127 mmol/L — ABNORMAL LOW (ref 135–145)
Total Bilirubin: 0.5 mg/dL (ref 0.3–1.2)
Total Protein: 8.3 g/dL — ABNORMAL HIGH (ref 6.5–8.1)

## 2018-12-24 LAB — INFLUENZA PANEL BY PCR (TYPE A & B)
Influenza A By PCR: POSITIVE — AB
Influenza B By PCR: NEGATIVE

## 2018-12-24 LAB — BRAIN NATRIURETIC PEPTIDE: B Natriuretic Peptide: 914 pg/mL — ABNORMAL HIGH (ref 0.0–100.0)

## 2018-12-24 LAB — TROPONIN I: Troponin I: <0.03 ng/mL (ref <0.03)

## 2018-12-24 MED ORDER — NITROGLYCERIN 0.4 MG SL SUBL
0.4000 mg | SUBLINGUAL_TABLET | SUBLINGUAL | Status: DC | PRN
  Administered 2018-12-24: 0.4 mg via SUBLINGUAL
  Filled 2018-12-24: qty 1

## 2018-12-24 MED ORDER — OSELTAMIVIR PHOSPHATE 75 MG PO CAPS
75.0000 mg | ORAL_CAPSULE | Freq: Two times a day (BID) | ORAL | Status: DC
  Administered 2018-12-24 – 2018-12-26 (×5): 75 mg via ORAL
  Filled 2018-12-24 (×5): qty 1

## 2018-12-24 MED ORDER — GUAIFENESIN ER 600 MG PO TB12
1200.0000 mg | ORAL_TABLET | Freq: Two times a day (BID) | ORAL | Status: DC
  Administered 2018-12-24 – 2018-12-26 (×5): 1200 mg via ORAL
  Filled 2018-12-24 (×5): qty 2

## 2018-12-24 MED ORDER — LOSARTAN POTASSIUM 25 MG PO TABS
12.5000 mg | ORAL_TABLET | Freq: Every day | ORAL | Status: DC
  Administered 2018-12-24 – 2018-12-26 (×3): 12.5 mg via ORAL
  Filled 2018-12-24 (×3): qty 1

## 2018-12-24 MED ORDER — HYDROCODONE-ACETAMINOPHEN 5-325 MG PO TABS
1.0000 | ORAL_TABLET | ORAL | Status: DC | PRN
  Administered 2018-12-24 – 2018-12-26 (×8): 2 via ORAL
  Filled 2018-12-24 (×9): qty 2

## 2018-12-24 MED ORDER — ACETAMINOPHEN 650 MG RE SUPP: 650.0000 mg | Freq: Four times a day (QID) | RECTAL | Status: DC | PRN

## 2018-12-24 MED ORDER — CARVEDILOL 12.5 MG PO TABS
12.5000 mg | ORAL_TABLET | Freq: Two times a day (BID) | ORAL | Status: DC
  Administered 2018-12-24 – 2018-12-26 (×4): 12.5 mg via ORAL
  Filled 2018-12-24 (×5): qty 1

## 2018-12-24 MED ORDER — WARFARIN - PHARMACIST DOSING INPATIENT: Freq: Every day | Status: DC

## 2018-12-24 MED ORDER — ONDANSETRON HCL 4 MG/2ML IJ SOLN
4.0000 mg | Freq: Four times a day (QID) | INTRAMUSCULAR | Status: DC | PRN
  Administered 2018-12-24: 4 mg via INTRAVENOUS
  Filled 2018-12-24 (×2): qty 2

## 2018-12-24 MED ORDER — ACETAMINOPHEN 325 MG PO TABS: 650.0000 mg | ORAL_TABLET | Freq: Four times a day (QID) | ORAL | Status: DC | PRN

## 2018-12-24 MED ORDER — WARFARIN SODIUM 5 MG PO TABS
10.0000 mg | ORAL_TABLET | Freq: Once | ORAL | Status: AC
  Administered 2018-12-24: 10 mg via ORAL
  Filled 2018-12-24: qty 2

## 2018-12-24 MED ORDER — SODIUM CHLORIDE 0.9% FLUSH
3.0000 mL | Freq: Two times a day (BID) | INTRAVENOUS | Status: DC
  Administered 2018-12-24 – 2018-12-26 (×5): 3 mL via INTRAVENOUS

## 2018-12-24 MED ORDER — LOPERAMIDE HCL 2 MG PO CAPS
2.0000 mg | ORAL_CAPSULE | ORAL | Status: DC | PRN
  Administered 2018-12-24 – 2018-12-25 (×4): 2 mg via ORAL
  Filled 2018-12-24 (×4): qty 1

## 2018-12-24 MED ORDER — GABAPENTIN 300 MG PO CAPS
300.0000 mg | ORAL_CAPSULE | Freq: Two times a day (BID) | ORAL | Status: DC
  Administered 2018-12-24 – 2018-12-26 (×5): 300 mg via ORAL
  Filled 2018-12-24 (×5): qty 1

## 2018-12-24 MED ORDER — DULOXETINE HCL 60 MG PO CPEP
60.0000 mg | ORAL_CAPSULE | Freq: Every morning | ORAL | Status: DC
  Administered 2018-12-24 – 2018-12-26 (×3): 60 mg via ORAL
  Filled 2018-12-24 (×3): qty 1

## 2018-12-24 MED ORDER — FUROSEMIDE 10 MG/ML IJ SOLN
60.0000 mg | Freq: Once | INTRAMUSCULAR | Status: AC
  Administered 2018-12-24: 60 mg via INTRAVENOUS
  Filled 2018-12-24: qty 6

## 2018-12-24 MED ORDER — ONDANSETRON HCL 4 MG PO TABS
4.0000 mg | ORAL_TABLET | Freq: Four times a day (QID) | ORAL | Status: DC | PRN
  Administered 2018-12-24 – 2018-12-26 (×4): 4 mg via ORAL
  Filled 2018-12-24 (×4): qty 1

## 2018-12-24 MED ORDER — SPIRONOLACTONE 25 MG PO TABS
25.0000 mg | ORAL_TABLET | Freq: Every day | ORAL | Status: DC
  Administered 2018-12-24 – 2018-12-26 (×3): 25 mg via ORAL
  Filled 2018-12-24 (×3): qty 1

## 2018-12-24 MED ORDER — ASPIRIN EC 81 MG PO TBEC
81.0000 mg | DELAYED_RELEASE_TABLET | Freq: Every day | ORAL | Status: DC
  Administered 2018-12-24 – 2018-12-25 (×2): 81 mg via ORAL
  Filled 2018-12-24 (×2): qty 1

## 2018-12-24 MED ORDER — CLONAZEPAM 0.5 MG PO TABS
1.0000 mg | ORAL_TABLET | Freq: Two times a day (BID) | ORAL | Status: DC | PRN
  Administered 2018-12-24 – 2018-12-25 (×2): 1 mg via ORAL
  Filled 2018-12-24 (×2): qty 2

## 2018-12-24 MED ORDER — POLYETHYLENE GLYCOL 3350 17 G PO PACK: 17.0000 g | PACK | Freq: Every day | ORAL | Status: DC | PRN

## 2018-12-24 MED ORDER — FUROSEMIDE 10 MG/ML IJ SOLN
40.0000 mg | Freq: Two times a day (BID) | INTRAMUSCULAR | Status: DC
  Administered 2018-12-24 – 2018-12-26 (×5): 40 mg via INTRAVENOUS
  Filled 2018-12-24 (×5): qty 4

## 2018-12-24 MED ORDER — POTASSIUM CHLORIDE CRYS ER 20 MEQ PO TBCR
20.0000 meq | EXTENDED_RELEASE_TABLET | Freq: Two times a day (BID) | ORAL | Status: DC
  Administered 2018-12-24 – 2018-12-26 (×5): 20 meq via ORAL
  Filled 2018-12-24 (×6): qty 1

## 2018-12-24 MED ORDER — ISOSORBIDE MONONITRATE ER 60 MG PO TB24
30.0000 mg | ORAL_TABLET | Freq: Every day | ORAL | Status: DC
  Administered 2018-12-24 – 2018-12-26 (×3): 30 mg via ORAL
  Filled 2018-12-24 (×3): qty 1

## 2018-12-24 MED ORDER — ADULT MULTIVITAMIN W/MINERALS CH
1.0000 | ORAL_TABLET | Freq: Every day | ORAL | Status: DC
  Administered 2018-12-24 – 2018-12-26 (×3): 1 via ORAL
  Filled 2018-12-24 (×3): qty 1

## 2018-12-24 MED ORDER — OSELTAMIVIR PHOSPHATE 75 MG PO CAPS
75.0000 mg | ORAL_CAPSULE | Freq: Once | ORAL | Status: AC
  Administered 2018-12-24: 75 mg via ORAL
  Filled 2018-12-24: qty 1

## 2018-12-24 NOTE — ED Provider Notes (Signed)
[facility] EMERGENCY DEPARTMENT Provider Note   CSN: [id number] Arrival date & time: 12/23/18  2249  Time seen 23:15 AM   History   Chief Complaint Chief Complaint  Patient presents with  . Shortness of Breath    HPI [name] [name] is a 57 y.o. female.  HPI patient states a couple days ago she had some chest pain under both her shoulder blades that radiated across to her back.  She states she took 2 nitroglycerin for the pain.  She states it lasted 20 minutes.  She estimates this happened on the 23rd.  She states she has been getting this pain for many years, probably 2008.  It can happen every couple months or can happen several times in a few weeks.  She states her cardiologist is aware of this pain and does not think it is related to her heart.  She states she has had a cardiac cath and does not have any blockages and does not have any stents.  She states she has cardiomyopathy and she has a defibrillator that was placed in 2008.  The reason for her visit tonight is she has been having shortness of breath that started over the last couple days, chills, feeling weak, coughing up pale yellow or blood-tinged sputum.  She describes PND and dyspnea on exertion.  She denies any swelling of her legs or abdominal swelling.  She does not use oxygen at home.  She denies nausea, vomiting, but she does endorse some sore throat yesterday although it does not hurt today.  She has had some clear and yellow rhinorrhea and started sneezing today.  She states she has chronic diarrhea from alpha gal syndrome.  She states she has been taking Benadryl and Flonase.  Patient states she has a antiphospholipid disorder and she is on Coumadin.  She states her last INR done earlier this month was 2.5.  Patient has taken the flu shot this season and is up-to-date on her pneumonia vaccine  PCP Kari Baars, MD Cardiology Dr Diona Browner  Past Medical History:  Diagnosis Date  . Aneurysm (HCC)    internal  carotid artery  . Anticardiolipin antibody positive    Chronic Coumadin  . Chronic combined systolic (congestive) and diastolic (congestive) heart failure (HCC)   . Cocaine abuse in remission (HCC)   . Coronary atherosclerosis of native coronary artery    Previous invasive cardiac testing was done at a facility in [place], [place].  Occluded diagonal, Diffuse LAD disease, Occluded Cx, and non obstructive RCA.   . Essential hypertension, benign   . Headache   . History of pneumonia   . Ischemic cardiomyopathy    LVEF 30-35%  . Mixed hyperlipidemia   . PVD (peripheral vascular disease) (HCC)   . Raynaud's phenomenon   . Stroke [facility]) 2007   Total occlusion of the right internal carotid    Patient Active Problem List   Diagnosis Date Noted  . H. influenzae infection 12/24/2018  . Chest pain 06/18/2017  . Hyponatremia 06/18/2017  . Tobacco dependence 06/18/2017  . 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 Left-sided weakness   . Chronic combined systolic (congestive) and diastolic (congestive) heart failure (HCC) 07/26/2015  . Encounter for therapeutic drug monitoring 02/02/2014  . Peripheral arterial disease (HCC) 11/09/2013  . Dual implantable cardioverter-defibrillator in situ 06/01/2012  . Long term current use of anticoagulant 03/22/2011  . Essential hypertension, benign 05/29/2010  . CHEST PAIN  05/29/2010  . Hyperlipidemia 09/26/2009  . Peripheral vascular disease (HCC) 06/23/2009  . Cardiomyopathy, ischemic 11/29/2008  . CEREBROVASCULAR DISEASE 11/29/2008  . Anticardiolipin syndrome (HCC) 11/29/2008    Past Surgical History:  Procedure Laterality Date  . ABDOMINAL AORTAGRAM N/A 05/25/2014   Procedure: ABDOMINAL Ronny FlurryAORTAGRAM;  Surgeon: Iran OuchMuhammad A Arida, MD;  Location: [facility] CATH LAB;  Service: Cardiovascular;  Laterality: N/A;  . APPENDECTOMY    . CARDIAC DEFIBRILLATOR PLACEMENT     [employer] ICD  . EP IMPLANTABLE  DEVICE N/A 03/01/2016   Procedure: ICD Generator Changeout;  Surgeon: Marinus MawGregg W Taylor, MD;  [facility];  Service: Cardiovascular;  Laterality: N/A;  . L1 corpectomy   12/10  . Laryngeal polyp excision       OB History   No obstetric history on file.      Home Medications    Prior to Admission medications   Medication Sig Start Date End Date Taking? Authorizing Provider  aspirin EC 81 MG tablet Take 81 mg by mouth daily.    [provider]  aspirin-acetaminophen-caffeine (EXCEDRIN MIGRAINE) [phone] MG tablet Take 2 tablets by mouth once as needed for headache or migraine.    [provider]  carvedilol (COREG) 12.5 MG tablet TAKE 1 TABLET BY MOUTH TWICE DAILY WITH MEALS 02/20/18   Jonelle SidleMcDowell, Samuel G, MD  clonazePAM (KLONOPIN) 1 MG tablet Take 1 mg by mouth 4 (four) times daily as needed for anxiety.    [provider]  DULoxetine (CYMBALTA) 60 MG capsule Take 60 mg by mouth every morning.    [provider]  furosemide (LASIX) 40 MG tablet TAKE 1 TABLET BY MOUTH DAILY AS NEEDED FOR SWELLING. 12/21/18   Jonelle SidleMcDowell, Samuel G, MD  furosemide (LASIX) 80 MG tablet TAKE 1 TABLET BY MOUTH DAILY 07/20/18   Jonelle SidleMcDowell, Samuel G, MD  gabapentin (NEURONTIN) 300 MG capsule Take 300 mg by mouth 2 (two) times daily.    [provider]  HYDROcodone-acetaminophen (NORCO) 10-325 MG per tablet Take 0.5 tablets by mouth 4 (four) times daily as needed for moderate pain or severe pain. pain    [provider]  isosorbide mononitrate (IMDUR) 30 MG 24 hr tablet TAKE 1 TABLET BY MOUTH DAILY 07/20/18   Jonelle SidleMcDowell, Samuel G, MD  losartan (COZAAR) 25 MG tablet TAKE 1/2 TABLET BY MOUTH EVERY DAY 07/08/18   Jonelle SidleMcDowell, Samuel G, MD  Multiple Vitamins-Minerals (MULTIVITAMINS THER. W/MINERALS) TABS Take 1 tablet by mouth daily.      [provider]  nicotine (NICODERM CQ - DOSED IN MG/24 HR) 7 mg/24hr patch Place 1 patch (7 mg total) onto the skin  daily. 06/19/17   Kari BaarsHawkins, Edward, MD  nitroGLYCERIN (NITROSTAT) 0.4 MG SL tablet PLACE 1 TABLET UNDER THE TONGUE EVERY 5 MINUTES FOR 3 DOSES AS NEEDED CHEST PAIN 11/30/18   Jonelle SidleMcDowell, Samuel G, MD  potassium chloride SA (K-DUR,KLOR-CON) 20 MEQ tablet TAKE 1 TABLET BY MOUTH EVERY DAY 07/30/18   Jonelle SidleMcDowell, Samuel G, MD  promethazine (PHENERGAN) 25 MG tablet Take 25 mg by mouth every 6 (six) hours as needed for nausea.    [provider]  ranitidine (ZANTAC) 150 MG tablet Take 150 mg by mouth 2 (two) times daily.    [provider]  spironolactone (ALDACTONE) 25 MG tablet TAKE 1 TABLET BY MOUTH EVERY DAY 07/20/18   Jonelle SidleMcDowell, Samuel G, MD  TiZANidine HCl (ZANAFLEX PO) Take by mouth.    [provider]  warfarin (COUMADIN) 10 MG tablet Take  1 tablet daily except 1/2 tablet on Thursdays Patient taking differently: Take 5-10 mg by mouth See admin instructions. TAKES 5MG  ON TUESDAYS AND THURSDAYS AND TAKES 10MG  ON ALL OTHER DAYS 05/22/17   [name], MD    Family History Family History  Problem Relation Age of Onset  . Heart disease Father 39  . Ankylosing spondylitis Sister   . Stomach cancer Brother 75  . Heart attack Brother     Social History Social History   Tobacco Use  . Smoking status: Current Every Day Smoker    Packs/day: 1.00    Years: 43.00    Pack years: 43.00    Types: E-cigarettes    Start date: 03/03/1978    Last attempt to quit: 10/03/2013    Years since quitting: 5.2  . Smokeless tobacco: Never Used  . Tobacco comment: resumed cigarettes, stopped last week but continues to vape  Substance Use Topics  . Alcohol use: No    Alcohol/week: 0.0 standard drinks  . Drug use: No    Comment: Prior history of cocaine  on disability Smokes 1/2 ppd   Allergies   Metoclopramide hcl; Penicillins; Metoprolol; and Statins   Review of Systems Review of Systems  All other systems reviewed and are negative.    Physical Exam Updated Vital Signs BP  134/87 (BP Location: Right Arm)   Pulse (!) 106   Temp 98.7 F (37.1 C) (Oral)   Resp 18   Ht 5' 6.5" (1.689 m)   Wt 99.8 kg   SpO2 94%   BMI 34.98 kg/m   Vital signs normal except tachycardia   Physical Exam Vitals signs and nursing note reviewed.  Constitutional:      General: She is not in acute distress.    Appearance: Normal appearance. She is well-developed. She is not ill-appearing or toxic-appearing.  HENT:     Head: Normocephalic and atraumatic.     Right Ear: External ear normal.     Left Ear: External ear normal.     Nose: Nose normal. No mucosal edema or rhinorrhea.     Mouth/Throat:     Dentition: No dental abscesses.     Pharynx: No uvula swelling.  Eyes:     Conjunctiva/sclera: Conjunctivae normal.     Pupils: Pupils are equal, round, and reactive to light.  Neck:     Musculoskeletal: Full passive range of motion without pain, normal range of motion and neck supple.  Cardiovascular:     Rate and Rhythm: Normal rate and regular rhythm.     Heart sounds: Normal heart sounds. No murmur. No friction rub. No gallop.   Pulmonary:     Effort: Pulmonary effort is normal. No tachypnea, accessory muscle usage or respiratory distress.     Breath sounds: Decreased breath sounds present. No wheezing, rhonchi or rales.  Chest:     Chest wall: No tenderness or crepitus.  Abdominal:     General: Bowel sounds are normal. There is no distension.     Palpations: Abdomen is soft.     Tenderness: There is no abdominal tenderness. There is no guarding or rebound.  Musculoskeletal: Normal range of motion.        General: No tenderness.     Comments: Moves all extremities well.   Skin:    General: Skin is warm and dry.     Coloration: Skin is not pale.     Findings: No erythema or rash.  Neurological:     Mental Status:  She is alert and oriented to person, place, and time.     Cranial Nerves: No cranial nerve deficit.  Psychiatric:        Mood and Affect: Mood is not  anxious.        Speech: Speech normal.        Behavior: Behavior normal.      ED Treatments / Results  Labs (all labs ordered are listed, but only abnormal results are displayed) Results for orders placed or performed during the hospital encounter of 12/23/18  Comprehensive metabolic panel  Result Value Ref Range   Sodium 127 (L) 135 - 145 mmol/L   Potassium 3.6 3.5 - 5.1 mmol/L   Chloride 94 (L) 98 - 111 mmol/L   CO2 23 22 - 32 mmol/L   Glucose, Bld 98 70 - 99 mg/dL   BUN 9 6 - 20 mg/dL   Creatinine, Ser 1.61 0.44 - 1.00 mg/dL   Calcium 8.5 (L) 8.9 - 10.3 mg/dL   Total Protein 8.3 (H) 6.5 - 8.1 g/dL   Albumin 3.9 3.5 - 5.0 g/dL   AST 34 15 - 41 U/L   ALT 28 0 - 44 U/L   Alkaline Phosphatase 58 38 - 126 U/L   Total Bilirubin 0.5 0.3 - 1.2 mg/dL   GFR calc non Af Amer >60 >60 mL/min   GFR calc Af Amer >60 >60 mL/min   Anion gap 10 5 - 15  Brain natriuretic peptide  Result Value Ref Range   B Natriuretic Peptide 914.0 (H) 0.0 - 100.0 pg/mL  Troponin I - Once  Result Value Ref Range   Troponin I <0.03 <0.03 ng/mL  CBC with Differential  Result Value Ref Range   WBC 6.3 4.0 - 10.5 K/uL   RBC 5.01 3.87 - 5.11 MIL/uL   Hemoglobin 14.3 12.0 - 15.0 g/dL   HCT 09.6 04.5 - 40.9 %   MCV 88.6 80.0 - 100.0 fL   MCH 28.5 26.0 - 34.0 pg   MCHC 32.2 30.0 - 36.0 g/dL   RDW 81.1 91.4 - 78.2 %   Platelets 50 (L) 150 - 400 K/uL   nRBC 0.0 0.0 - 0.2 %   Neutrophils Relative % 77 %   Neutro Abs 4.9 1.7 - 7.7 K/uL   Lymphocytes Relative 13 %   Lymphs Abs 0.8 0.7 - 4.0 K/uL   Monocytes Relative 9 %   Monocytes Absolute 0.6 0.1 - 1.0 K/uL   Eosinophils Relative 0 %   Eosinophils Absolute 0.0 0.0 - 0.5 K/uL   Basophils Relative 1 %   Basophils Absolute 0.1 0.0 - 0.1 K/uL   Immature Granulocytes 0 %   Abs Immature Granulocytes 0.02 0.00 - 0.07 K/uL  Influenza panel by PCR (type A & B)  Result Value Ref Range   Influenza A By PCR POSITIVE (A) NEGATIVE   Influenza B By PCR NEGATIVE  NEGATIVE  Protime-INR  Result Value Ref Range   Prothrombin Time 18.7 (H) 11.4 - 15.2 seconds   INR 1.58    Laboratory interpretation all normal except subtherapeutic INR, minimally elevated BNP, hyponatremia which she has had before    EKG EKG Interpretation  Date/Time:  Thursday December 24 2018 00:19:12 EST Ventricular Rate:  95 PR Interval:    QRS Duration: 107 QT Interval:  335 QTC Calculation: 422 R Axis:   62 Text Interpretation:  Sinus rhythm Ventricular premature complex Low voltage, extremity leads Minimal ST depression, lateral leads Minimal ST elevation, anterior leads  Since last tracing rate faster 19 Jun 2017 Confirmed by Devoria Albe ([id number]) on 12/24/2018 12:41:08 AM   Radiology Dg Chest 2 View  Result Date: 12/24/2018 CLINICAL DATA:  Acute onset of cough, shortness of breath and chills. Chest tightness. EXAM: CHEST - 2 VIEW COMPARISON:  Chest radiograph performed 01/14/2018 FINDINGS: The lungs are well-aerated. Vascular congestion is noted. Increased interstitial markings raise concern for mild interstitial edema, though pneumonia could have a similar appearance. Peribronchial thickening is noted. There is no evidence of pleural effusion or pneumothorax. The heart is mildly enlarged. A pacemaker/AICD is noted at the left chest wall, with leads ending at the right atrium and right ventricle. Lumbar spinal fusion hardware is partially imaged. No acute osseous abnormalities are seen. IMPRESSION: Vascular congestion and mild cardiomegaly. Increased interstitial markings raise concern for mild interstitial edema, though pneumonia could have a similar appearance. Peribronchial thickening noted. Electronically Signed   By: Roanna Raider M.D.   On: 12/24/2018 00:16    Procedures .Critical Care Performed by: Devoria Albe, MD Authorized by: Devoria Albe, MD   Critical care provider statement:    Critical care time (minutes):  31   Critical care was necessary to treat or prevent  imminent or life-threatening deterioration of the following conditions:  Cardiac failure   Critical care was time spent personally by me on the following activities:  Discussions with consultants, examination of patient, obtaining history from patient or surrogate, ordering and review of laboratory studies, ordering and review of radiographic studies, pulse oximetry and re-evaluation of patient's condition   (including critical care time)  Medications Ordered in ED Medications  furosemide (LASIX) injection 60 mg (60 mg Intravenous Given 12/24/18 0059)  oseltamivir (TAMIFLU) capsule 75 mg (75 mg Oral Given 12/24/18 0315)     Initial Impression / Assessment and Plan / ED Course  I have reviewed the triage vital signs and the nursing notes.  Pertinent labs & imaging results that were available during my care of the patient were reviewed by me and considered in my medical decision making (see chart for details).     Patient's lungs were clear on my exam and she had no peripheral edema.  Chest x-ray was ordered to help decide if she needed any diuretics.  Laboratory testing was done.  12:50 AM after reviewing her chest x-ray patient was given 60 of Lasix.  When I review her last echocardiogram which was in the summer 2018 her ejection fraction was 30%.  2 AM when I rechecked the patient she states she has not had much urinary output.  Patient states she feels extremely weak.  Her influenza test has come back positive.  I am going to have nursing staff ambulate her to see if she becomes hypoxic.  Nurses report when she they ambulated her without oxygen her pulse ox was 92% however she started coughing and it fell to 88%.  They report she stumbled while she was ambulating.  I talked to the patient that she probably needs to be admitted due to her hypoxia and she is agreeable.  Family was questioning her heart rate which is around 100 on the monitor.  They state her heart rate is normally in the  60 range.  However at this point I do not feel compelled to start her on any other medication to control her heart rate.  Patient was started on Tamiflu.  3:29 AM patient discussed with Dr. Loney Loh, hospitalist who will admit.  Final Clinical Impressions(s) / ED Diagnoses  Final diagnoses:  Acute on chronic congestive heart failure, unspecified heart failure type (HCC)  Hypoxia  Influenza A    Plan admission  Devoria Albe, MD, Concha Pyo, MD 12/24/18 [phone]

## 2018-12-24 NOTE — Progress Notes (Signed)
ANTICOAGULATION CONSULT NOTE - Initial Consult  Pharmacy Consult for warfarin Indication: atrial fibrillation  Allergies  Allergen Reactions  . Metoclopramide Hcl Anaphylaxis    Non responsive  . Penicillins Anaphylaxis    Shock Has patient had a PCN reaction causing immediate rash, facial/tongue/throat swelling, SOB or lightheadedness with hypotension: Yes Has patient had a PCN reaction causing severe rash involving mucus membranes or skin necrosis: No Has patient had a PCN reaction that required hospitalization Yes Has patient had a PCN reaction occurring within the last 10 years: No If all of the above answers are "NO", then may proceed with Cephalosporin use.   . Metoprolol Other (See Comments)    Syncope   . Statins Other (See Comments)    Myalgias    Patient Measurements: Height: 5' 6.5" (168.9 cm) Weight: 220 lb (99.8 kg) IBW/kg (Calculated) : 60.45   Vital Signs: Temp: 98.8 F (37.1 C) (12/26 0426) Temp Source: Oral (12/26 0426) BP: 132/65 (12/26 0426) Pulse Rate: 52 (12/26 0426)  Labs: Recent Labs    12/23/18 2335  HGB 14.3  HCT 44.4  PLT 50*  LABPROT 18.7*  INR 1.58  CREATININE 0.76  TROPONINI <0.03    Estimated Creatinine Clearance: 95.6 mL/min (by C-G formula based on SCr of 0.76 mg/dL).   Medical History: Past Medical History:  Diagnosis Date  . Aneurysm (HCC)    internal carotid artery  . Anticardiolipin antibody positive    Chronic Coumadin  . Chronic combined systolic (congestive) and diastolic (congestive) heart failure (HCC)   . Cocaine abuse in remission (HCC)   . Coronary atherosclerosis of native coronary artery    Previous invasive cardiac testing was done at a facility in [place], [place].  Occluded diagonal, Diffuse LAD disease, Occluded Cx, and non obstructive RCA.   . Essential hypertension, benign   . Headache   . History of pneumonia   . Ischemic cardiomyopathy    LVEF 30-35%  . Mixed hyperlipidemia   . PVD  (peripheral vascular disease) (HCC)   . Raynaud's phenomenon   . Stroke [facility]) 2007   Total occlusion of the right internal carotid    Medications:  Medications Prior to Admission  Medication Sig Dispense Refill Last Dose  . aspirin EC 81 MG tablet Take 81 mg by mouth daily.   Taking  . aspirin-acetaminophen-caffeine (EXCEDRIN MIGRAINE) 250-250-65 MG tablet Take 2 tablets by mouth once as needed for headache or migraine.   Taking  . carvedilol (COREG) 12.5 MG tablet TAKE 1 TABLET BY MOUTH TWICE DAILY WITH MEALS 180 tablet 3 Taking  . clonazePAM (KLONOPIN) 1 MG tablet Take 1 mg by mouth 4 (four) times daily as needed for anxiety.   Taking  . DULoxetine (CYMBALTA) 60 MG capsule Take 60 mg by mouth every morning.   Taking  . furosemide (LASIX) 40 MG tablet TAKE 1 TABLET BY MOUTH DAILY AS NEEDED FOR SWELLING. 30 tablet 6   . furosemide (LASIX) 80 MG tablet TAKE 1 TABLET BY MOUTH DAILY 90 tablet 1 Taking  . gabapentin (NEURONTIN) 300 MG capsule Take 300 mg by mouth 2 (two) times daily.   Taking  . HYDROcodone-acetaminophen (NORCO) 10-325 MG per tablet Take 0.5 tablets by mouth 4 (four) times daily as needed for moderate pain or severe pain. pain   Taking  . isosorbide mononitrate (IMDUR) 30 MG 24 hr tablet TAKE 1 TABLET BY MOUTH DAILY 90 tablet 1 Taking  . losartan (COZAAR) 25 MG tablet TAKE 1/2 TABLET BY MOUTH EVERY  DAY 45 tablet 1 Taking  . Multiple Vitamins-Minerals (MULTIVITAMINS THER. W/MINERALS) TABS Take 1 tablet by mouth daily.     Taking  . nicotine (NICODERM CQ - DOSED IN MG/24 HR) 7 mg/24hr patch Place 1 patch (7 mg total) onto the skin daily. 28 patch 0 Taking  . nitroGLYCERIN (NITROSTAT) 0.4 MG SL tablet PLACE 1 TABLET UNDER THE TONGUE EVERY 5 MINUTES FOR 3 DOSES AS NEEDED CHEST PAIN 25 tablet 3 Taking  . potassium chloride SA (K-DUR,KLOR-CON) 20 MEQ tablet TAKE 1 TABLET BY MOUTH EVERY DAY 90 tablet 3 Taking  . promethazine (PHENERGAN) 25 MG tablet Take 25 mg by mouth every 6 (six)  hours as needed for nausea.   Taking  . ranitidine (ZANTAC) 150 MG tablet Take 150 mg by mouth 2 (two) times daily.   Taking  . spironolactone (ALDACTONE) 25 MG tablet TAKE 1 TABLET BY MOUTH EVERY DAY 90 tablet 1 Taking  . TiZANidine HCl (ZANAFLEX PO) Take by mouth.   Taking  . warfarin (COUMADIN) 10 MG tablet Take 1 tablet daily except 1/2 tablet on Thursdays (Patient taking differently: Take 5-10 mg by mouth See admin instructions. TAKES 5MG  ON TUESDAYS AND THURSDAYS AND TAKES 10MG  ON ALL OTHER DAYS) 30 tablet 3 Taking    Assessment: Pharmacy consulted to dose warfarin for patient with atrial fibrillation.  Patient's INR on admission is 1.58.  Home dose listed as 5 mg MWF and 10 mg ROW.  Goal of Therapy:  INR 2-3 Monitor platelets by anticoagulation protocol: Yes   Plan:  Warfarin 10 mg x 1 dose. Monitor daily INR and s/s of bleeding  [name] [name] 12/24/2018,10:30 AM

## 2018-12-24 NOTE — ED Notes (Signed)
Pt. While without oxygen, and walking had an saturation of 92% oxygen. When coughing her oxygen percentage fell to 88% without oxygen. Pt. Stumbled while ambulating.

## 2018-12-24 NOTE — Progress Notes (Signed)
Patient calls this nurse ask for nitro, patient says she is having back pain which is where her chest pain normally is. Pain through her shoulder blades and neck pain. Vitals: B/P 127/84 pulse 88, one nitro given at this time. MD notified.

## 2018-12-24 NOTE — Progress Notes (Signed)
Patient pain has relieved some, B/P 77/53, holding off on any more nitro, giving zofran now for nausea.

## 2018-12-24 NOTE — Progress Notes (Signed)
Central Telemetry just notified this nurse patient had a 5 beat run of v-tach, will monitor. Patient stable, no complaints of chest pain. Will notify MD.

## 2018-12-24 NOTE — H&P (Signed)
[name] [name] MRN: [id number] DOB/AGE: [birth year] 57 y.o. Primary Care Physician:Aceson Labell, Ramon Dredge, MD Admit date: 12/23/2018 Chief Complaint: Influenza HPI: This is a 57 year old with a very complicated medical history who has come to the hospital because of fever and cough productive of a pinkish sputum.  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  She says she was feeling fairly well went to a family gathering and multiple family members have become sick after that.  She had fever and diffuse aching.  She is positive for influenza.  Chest x-ray which I personally reviewed shows some evidence of heart failure which is acute and acute on chronic problem.  She denies any chest pain except from coughing.  No strokelike symptoms.  She is nauseated but no vomiting.  No urinary symptoms  Past Medical History:  Diagnosis Date  . Aneurysm (HCC)    internal carotid artery  . Anticardiolipin antibody positive    Chronic Coumadin  . Chronic combined systolic (congestive) and diastolic (congestive) heart failure (HCC)   . Cocaine abuse in remission (HCC)   . Coronary atherosclerosis of native coronary artery    Previous invasive cardiac testing was done at a facility in [place], [place].  Occluded diagonal, Diffuse LAD disease, Occluded Cx, and non obstructive RCA.   . Essential hypertension, benign   . Headache   . History of pneumonia   . Ischemic cardiomyopathy    LVEF 30-35%  . Mixed hyperlipidemia   . PVD (peripheral vascular disease) (HCC)   . Raynaud's phenomenon   . Stroke [facility]) 2007   Total occlusion of the right internal carotid   Past Surgical History:  Procedure Laterality Date  . ABDOMINAL AORTAGRAM N/A  05/25/2014   Procedure: ABDOMINAL Ronny Flurry;  Surgeon: Iran Ouch, MD;  Location: [facility] CATH LAB;  Service: Cardiovascular;  Laterality: N/A;  . APPENDECTOMY    . CARDIAC DEFIBRILLATOR PLACEMENT     [employer] ICD  . EP IMPLANTABLE DEVICE N/A 03/01/2016   Procedure: ICD Generator Changeout;  Surgeon: [name], MD;  Location: [facility] INVASIVE CV LAB;  Service: Cardiovascular;  Laterality: N/A;  . L1 corpectomy   12/10  . Laryngeal polyp excision          Family History  Problem Relation Age of Onset  . Heart disease Father 53  . Ankylosing spondylitis Sister   . Stomach cancer Brother 31  . Heart attack Brother     Social History:  reports that she has been smoking e-cigarettes. She started smoking about 40 years ago. She has a 43.00 pack-year smoking history. She has never used smokeless tobacco. She reports that she does not drink alcohol or use drugs.   Allergies:  Allergies  Allergen Reactions  . Metoclopramide Hcl Anaphylaxis    Non responsive  . Penicillins Anaphylaxis    Shock Has patient had a PCN reaction causing immediate rash, facial/tongue/throat swelling, SOB or lightheadedness with hypotension: Yes Has patient had a PCN reaction causing severe rash involving mucus membranes or skin necrosis: No Has patient had a PCN reaction that required hospitalization Yes Has patient had a PCN reaction occurring within the last 10 years: No If all of the above answers are "NO", then may proceed with Cephalosporin use.   . Metoprolol Other (See Comments)  Syncope   . Statins Other (See Comments)    Myalgias    Medications Prior to Admission  Medication Sig Dispense Refill  . aspirin EC 81 MG tablet Take 81 mg by mouth daily.    Marland Kitchen aspirin-acetaminophen-caffeine (EXCEDRIN MIGRAINE) 250-250-65 MG tablet Take 2 tablets by mouth once as needed for headache or migraine.    . carvedilol (COREG) 12.5 MG tablet TAKE 1 TABLET BY MOUTH TWICE DAILY WITH MEALS 180 tablet 3  .  clonazePAM (KLONOPIN) 1 MG tablet Take 1 mg by mouth 4 (four) times daily as needed for anxiety.    . DULoxetine (CYMBALTA) 60 MG capsule Take 60 mg by mouth every morning.    . furosemide (LASIX) 40 MG tablet TAKE 1 TABLET BY MOUTH DAILY AS NEEDED FOR SWELLING. 30 tablet 6  . furosemide (LASIX) 80 MG tablet TAKE 1 TABLET BY MOUTH DAILY 90 tablet 1  . gabapentin (NEURONTIN) 300 MG capsule Take 300 mg by mouth 2 (two) times daily.    Marland Kitchen HYDROcodone-acetaminophen (NORCO) 10-325 MG per tablet Take 0.5 tablets by mouth 4 (four) times daily as needed for moderate pain or severe pain. pain    . isosorbide mononitrate (IMDUR) 30 MG 24 hr tablet TAKE 1 TABLET BY MOUTH DAILY 90 tablet 1  . losartan (COZAAR) 25 MG tablet TAKE 1/2 TABLET BY MOUTH EVERY DAY 45 tablet 1  . Multiple Vitamins-Minerals (MULTIVITAMINS THER. W/MINERALS) TABS Take 1 tablet by mouth daily.      . nicotine (NICODERM CQ - DOSED IN MG/24 HR) 7 mg/24hr patch Place 1 patch (7 mg total) onto the skin daily. 28 patch 0  . nitroGLYCERIN (NITROSTAT) 0.4 MG SL tablet PLACE 1 TABLET UNDER THE TONGUE EVERY 5 MINUTES FOR 3 DOSES AS NEEDED CHEST PAIN 25 tablet 3  . potassium chloride SA (K-DUR,KLOR-CON) 20 MEQ tablet TAKE 1 TABLET BY MOUTH EVERY DAY 90 tablet 3  . promethazine (PHENERGAN) 25 MG tablet Take 25 mg by mouth every 6 (six) hours as needed for nausea.    . ranitidine (ZANTAC) 150 MG tablet Take 150 mg by mouth 2 (two) times daily.    Marland Kitchen spironolactone (ALDACTONE) 25 MG tablet TAKE 1 TABLET BY MOUTH EVERY DAY 90 tablet 1  . TiZANidine HCl (ZANAFLEX PO) Take by mouth.    . warfarin (COUMADIN) 10 MG tablet Take 1 tablet daily except 1/2 tablet on Thursdays (Patient taking differently: Take 5-10 mg by mouth See admin instructions. TAKES 5MG  ON TUESDAYS AND THURSDAYS AND TAKES 10MG  ON ALL OTHER DAYS) 30 tablet 3       SLP:NPYYF from the symptoms mentioned above,there are no other symptoms referable to all systems reviewed.  10 point review  of systems otherwise negative  Physical Exam: Blood pressure 132/65, pulse (!) 52, temperature 98.8 F (37.1 C), temperature source Oral, resp. rate (!) 23, height 5' 6.5" (1.689 m), weight 99.8 kg, SpO2 91 %. Constitutional: She is awake and alert and in no acute distress.  Eyes: Pupils react EOMI.  Ears nose mouth and throat: Mucous membranes are dry.  Hearing is grossly normal.  Cardiovascular: Her heart is regular.  She has a systolic heart murmur.  Respiratory: She has some rhonchi bilaterally gastrointestinal: Her abdomen soft with no masses.  Skin: Skin turgor fair.  Neurological: No focal abnormalities.  Psychiatric: She is anxious   Recent Labs    12/23/18 2335  WBC 6.3  NEUTROABS 4.9  HGB 14.3  HCT 44.4  MCV 88.6  PLT 50*  Recent Labs    12/23/18 2335  NA 127*  K 3.6  CL 94*  CO2 23  GLUCOSE 98  BUN 9  CREATININE 0.76  CALCIUM 8.5*  lablast2(ast:2,ALT:2,alkphos:2,bilitot:2,prot:2,albumin:2)@    Recent Results (from the past 240 hour(s))  Culture, blood (routine x 2)     Status: None (Preliminary result)   Collection Time: 12/23/18 11:35 PM  Result Value Ref Range Status   Specimen Description BLOOD RIGHT ANTECUBITAL  Final   Special Requests   Final    BOTTLES DRAWN AEROBIC AND ANAEROBIC Blood Culture adequate volume   Culture   Final    NO GROWTH < 12 HOURS Performed at [facility], [address] [place], [place] [address]    Report Status PENDING  Incomplete  Culture, blood (routine x 2)     Status: None (Preliminary result)   Collection Time: 12/23/18 11:50 PM  Result Value Ref Range Status   Specimen Description BLOOD LEFT ANTECUBITAL  Final   Special Requests   Final    BOTTLES DRAWN AEROBIC AND ANAEROBIC Blood Culture adequate volume   Culture   Final    NO GROWTH < 12 HOURS Performed at [facility], [address] [place], [place] [address]    Report Status PENDING  Incomplete     Dg Chest 2 View  Result Date: 12/24/2018 CLINICAL DATA:   Acute onset of cough, shortness of breath and chills. Chest tightness. EXAM: CHEST - 2 VIEW COMPARISON:  Chest radiograph performed 01/14/2018 FINDINGS: The lungs are well-aerated. Vascular congestion is noted. Increased interstitial markings raise concern for mild interstitial edema, though pneumonia could have a similar appearance. Peribronchial thickening is noted. There is no evidence of pleural effusion or pneumothorax. The heart is mildly enlarged. A pacemaker/AICD is noted at the left chest wall, with leads ending at the right atrium and right ventricle. Lumbar spinal fusion hardware is partially imaged. No acute osseous abnormalities are seen. IMPRESSION: Vascular congestion and mild cardiomegaly. Increased interstitial markings raise concern for mild interstitial edema, though pneumonia could have a similar appearance. Peribronchial thickening noted. Electronically Signed   By: Roanna RaiderJeffery  Chang M.D.   On: 12/24/2018 00:16   Impression: She has multiple medical problems.  She has influenza A.  She has some element of acute on chronic heart failure and that is being treated  She has coronary disease stable  She has an implantable defibrillator which has not fired recently Active Problems:   H. influenzae infection   Influenza A     Plan: Continue treatments.  Tamiflu.  Continue diuresis.      Fredirick MaudlinEdward L Adelynne Joerger   12/24/2018, 10:27 AM

## 2018-12-24 NOTE — Progress Notes (Signed)
Patient blood pressure came up to 96/79 and giving some fluids. MD notified. Patient chest pain relieved.

## 2018-12-25 ENCOUNTER — Other Ambulatory Visit (HOSPITAL_COMMUNITY): Payer: Self-pay | Admitting: Internal Medicine

## 2018-12-25 LAB — CBC
HCT: 42.2 % (ref 36.0–46.0)
Hemoglobin: 13.7 g/dL (ref 12.0–15.0)
MCH: 29 pg (ref 26.0–34.0)
MCHC: 32.5 g/dL (ref 30.0–36.0)
MCV: 89.4 fL (ref 80.0–100.0)
Platelets: 20 10*3/uL — CL (ref 150–400)
RBC: 4.72 MIL/uL (ref 3.87–5.11)
RDW: 12.8 % (ref 11.5–15.5)
WBC: 9.1 10*3/uL (ref 4.0–10.5)
nRBC: 0 % (ref 0.0–0.2)

## 2018-12-25 LAB — BASIC METABOLIC PANEL
Anion gap: 10 (ref 5–15)
BUN: 14 mg/dL (ref 6–20)
CO2: 26 mmol/L (ref 22–32)
Calcium: 8.3 mg/dL — ABNORMAL LOW (ref 8.9–10.3)
Chloride: 87 mmol/L — ABNORMAL LOW (ref 98–111)
Creatinine, Ser: 0.7 mg/dL (ref 0.44–1.00)
GFR calc Af Amer: >60 mL/min (ref >60)
GFR calc non Af Amer: >60 mL/min (ref >60)
Glucose, Bld: 98 mg/dL (ref 70–99)
Potassium: 4.1 mmol/L (ref 3.5–5.1)
Sodium: 123 mmol/L — ABNORMAL LOW (ref 135–145)

## 2018-12-25 LAB — COMPREHENSIVE METABOLIC PANEL
ALT: 29 U/L (ref 0–44)
AST: 37 U/L (ref 15–41)
Albumin: 3.2 g/dL — ABNORMAL LOW (ref 3.5–5.0)
Alkaline Phosphatase: 43 U/L (ref 38–126)
Anion gap: 9 (ref 5–15)
BUN: 15 mg/dL (ref 6–20)
CO2: 26 mmol/L (ref 22–32)
Calcium: 8.1 mg/dL — ABNORMAL LOW (ref 8.9–10.3)
Chloride: 88 mmol/L — ABNORMAL LOW (ref 98–111)
Creatinine, Ser: 0.72 mg/dL (ref 0.44–1.00)
GFR calc Af Amer: >60 mL/min (ref >60)
GFR calc non Af Amer: >60 mL/min (ref >60)
Glucose, Bld: 134 mg/dL — ABNORMAL HIGH (ref 70–99)
Potassium: 3.6 mmol/L (ref 3.5–5.1)
Sodium: 123 mmol/L — ABNORMAL LOW (ref 135–145)
Total Bilirubin: 0.4 mg/dL (ref 0.3–1.2)
Total Protein: 6.8 g/dL (ref 6.5–8.1)

## 2018-12-25 LAB — APTT: aPTT: 60 seconds — ABNORMAL HIGH (ref 24–36)

## 2018-12-25 LAB — HIV ANTIBODY (ROUTINE TESTING W REFLEX): HIV Screen 4th Generation wRfx: NONREACTIVE

## 2018-12-25 LAB — ABO/RH: ABO/RH(D): A POS

## 2018-12-25 LAB — PROTIME-INR
INR: 1.52
Prothrombin Time: 18.2 seconds — ABNORMAL HIGH (ref 11.4–15.2)

## 2018-12-25 LAB — LACTATE DEHYDROGENASE: LDH: 156 U/L (ref 98–192)

## 2018-12-25 MED ORDER — WARFARIN SODIUM 5 MG PO TABS
5.0000 mg | ORAL_TABLET | Freq: Once | ORAL | Status: AC
  Administered 2018-12-25: 5 mg via ORAL
  Filled 2018-12-25: qty 1

## 2018-12-25 MED ORDER — SODIUM CHLORIDE 0.9% IV SOLUTION: Freq: Once | INTRAVENOUS | Status: DC

## 2018-12-25 NOTE — Progress Notes (Signed)
CRITICAL VALUE ALERT  Critical Value: Platelet count 20   Date & Time Notied:  12/25/18 @0656    Provider Notified: Dr. [name]   Orders Received/Actions taken: awaiting

## 2018-12-25 NOTE — Progress Notes (Addendum)
ANTICOAGULATION CONSULT NOTE -  Pharmacy Consult for warfarin Indication: atrial fibrillation  Allergies  Allergen Reactions  . Metoclopramide Hcl Anaphylaxis    Non responsive  . Penicillins Anaphylaxis    Shock Has patient had a PCN reaction causing immediate rash, facial/tongue/throat swelling, SOB or lightheadedness with hypotension: Yes Has patient had a PCN reaction causing severe rash involving mucus membranes or skin necrosis: No Has patient had a PCN reaction that required hospitalization Yes Has patient had a PCN reaction occurring within the last 10 years: No If all of the above answers are "NO", then may proceed with Cephalosporin use.   . Metoprolol Other (See Comments)    Syncope   . Statins Other (See Comments)    Myalgias    Patient Measurements: Height: 5' 6.5" (168.9 cm) Weight: 223 lb 15.8 oz (101.6 kg) IBW/kg (Calculated) : 60.45   Vital Signs:    Labs: Recent Labs    12/23/18 2335 12/25/18 0543  HGB 14.3 13.7  HCT 44.4 42.2  PLT 50* 20*  LABPROT 18.7* 18.2*  INR 1.58 1.52  CREATININE 0.76 0.70  TROPONINI <0.03  --     Estimated Creatinine Clearance: 96.5 mL/min (by C-G formula based on SCr of 0.7 mg/dL).   Medical History: Past Medical History:  Diagnosis Date  . Aneurysm (HCC)    internal carotid artery  . Anticardiolipin antibody positive    Chronic Coumadin  . Chronic combined systolic (congestive) and diastolic (congestive) heart failure (HCC)   . Cocaine abuse in remission (HCC)   . Coronary atherosclerosis of native coronary artery    Previous invasive cardiac testing was done at a facility in [place], [place].  Occluded diagonal, Diffuse LAD disease, Occluded Cx, and non obstructive RCA.   . Essential hypertension, benign   . Headache   . History of pneumonia   . Ischemic cardiomyopathy    LVEF 30-35%  . Mixed hyperlipidemia   . PVD (peripheral vascular disease) (HCC)   . Raynaud's phenomenon   . Stroke [facility]) 2007   Total occlusion of the right internal carotid    Medications:  Medications Prior to Admission  Medication Sig Dispense Refill Last Dose  . aspirin EC 81 MG tablet Take 81 mg by mouth daily.   12/23/2018 at 800  . aspirin-acetaminophen-caffeine (EXCEDRIN MIGRAINE) 250-250-65 MG tablet Take 2 tablets by mouth once as needed for headache or migraine.   Taking  . carvedilol (COREG) 12.5 MG tablet TAKE 1 TABLET BY MOUTH TWICE DAILY WITH MEALS 180 tablet 3 12/23/2018 at 2100  . clonazePAM (KLONOPIN) 1 MG tablet Take 1 mg by mouth 4 (four) times daily as needed for anxiety.   12/23/2018 at Unknown time  . DULoxetine (CYMBALTA) 60 MG capsule Take 60 mg by mouth every morning.   12/23/2018 at Unknown time  . furosemide (LASIX) 40 MG tablet TAKE 1 TABLET BY MOUTH DAILY AS NEEDED FOR SWELLING. 30 tablet 6   . furosemide (LASIX) 80 MG tablet TAKE 1 TABLET BY MOUTH DAILY 90 tablet 1 12/23/2018 at Unknown time  . gabapentin (NEURONTIN) 300 MG capsule Take 300 mg by mouth 2 (two) times daily.   12/23/2018 at Unknown time  . HYDROcodone-acetaminophen (NORCO) 10-325 MG per tablet Take 0.5 tablets by mouth 4 (four) times daily as needed for moderate pain or severe pain. pain   12/23/2018 at Unknown time  . isosorbide mononitrate (IMDUR) 30 MG 24 hr tablet TAKE 1 TABLET BY MOUTH DAILY 90 tablet 1 12/23/2018 at Unknown time  .  losartan (COZAAR) 25 MG tablet TAKE 1/2 TABLET BY MOUTH EVERY DAY 45 tablet 1 12/23/2018 at Unknown time  . Multiple Vitamins-Minerals (MULTIVITAMINS THER. W/MINERALS) TABS Take 1 tablet by mouth daily.     12/23/2018 at Unknown time  . nicotine (NICODERM CQ - DOSED IN MG/24 HR) 7 mg/24hr patch Place 1 patch (7 mg total) onto the skin daily. 28 patch 0 Taking  . nitroGLYCERIN (NITROSTAT) 0.4 MG SL tablet PLACE 1 TABLET UNDER THE TONGUE EVERY 5 MINUTES FOR 3 DOSES AS NEEDED CHEST PAIN 25 tablet 3 Taking  . potassium chloride SA (K-DUR,KLOR-CON) 20 MEQ tablet TAKE 1 TABLET BY MOUTH EVERY DAY 90  tablet 3 12/23/2018 at Unknown time  . promethazine (PHENERGAN) 25 MG tablet Take 25 mg by mouth every 6 (six) hours as needed for nausea.   Taking  . ranitidine (ZANTAC) 150 MG tablet Take 150 mg by mouth 2 (two) times daily.   12/23/2018 at Unknown time  . spironolactone (ALDACTONE) 25 MG tablet TAKE 1 TABLET BY MOUTH EVERY DAY 90 tablet 1 12/23/2018 at Unknown time  . warfarin (COUMADIN) 10 MG tablet Take 1 tablet daily except 1/2 tablet on Thursdays (Patient taking differently: Take 5-10 mg by mouth See admin instructions. TAKES 5MG  ON TUESDAYS AND THURSDAYS AND TAKES 10MG  ON ALL OTHER DAYS) 30 tablet 3 12/23/2018 at 800    Assessment: Pharmacy consulted to dose warfarin for patient with atrial fibrillation.  Patient's INR is subtherapeutic at 1.52.  Home dose listed as 5 mg MWF and 10 mg ROW.  Platelets decreased from 50k to [facility]20k with hematology consult requested.    Goal of Therapy:  INR 2-3 Monitor platelets by anticoagulation protocol: Yes   Plan:  Warfarin 5 mg x 1 dose. Monitor daily INR and s/s of bleeding due to thrombocytopenia. Consider holding aspirin until improvement.  Judeth CornfieldSteven Yarielys Beed, PharmD Clinical Pharmacist 12/25/2018 1:43 PM

## 2018-12-25 NOTE — Progress Notes (Signed)
Subjective: She says she feels a little bit better.  She has no new complaints.  Her breathing is okay.  She is coughing up greenish-brown sputum.  Her platelet count has dropped to 20,000 but she is not having any bleeding  Objective: Vital signs in last 24 hours: Pulse Rate:  [83-101] 83 (12/26 1458) BP: (66-127)/(50-84) 101/74 (12/26 1458) SpO2:  [93 %] 93 % (12/26 2115) Weight:  [101.6 kg] 101.6 kg (12/27 0644) Weight change: 1.809 kg    Intake/Output from previous day: 12/26 0701 - 12/27 0700 In: 720 [P.O.:720] Out: -   PHYSICAL EXAM General appearance: alert, cooperative and mild distress Resp: rhonchi bilaterally Cardio: regular rate and rhythm, S1, S2 normal, no murmur, click, rub or gallop GI: soft, non-tender; bowel sounds normal; no masses,  no organomegaly Extremities: extremities normal, atraumatic, no cyanosis or edema  Lab Results:  Results for orders placed or performed during the hospital encounter of 12/23/18 (from the past 48 hour(s))  Comprehensive metabolic panel     Status: Abnormal   Collection Time: 12/23/18 11:35 PM  Result Value Ref Range   Sodium 127 (L) 135 - 145 mmol/L   Potassium 3.6 3.5 - 5.1 mmol/L   Chloride 94 (L) 98 - 111 mmol/L   CO2 23 22 - 32 mmol/L   Glucose, Bld 98 70 - 99 mg/dL   BUN 9 6 - 20 mg/dL   Creatinine, Ser 9.600.76 0.44 - 1.00 mg/dL   Calcium 8.5 (L) 8.9 - 10.3 mg/dL   Total Protein 8.3 (H) 6.5 - 8.1 g/dL   Albumin 3.9 3.5 - 5.0 g/dL   AST 34 15 - 41 U/L   ALT 28 0 - 44 U/L   Alkaline Phosphatase 58 38 - 126 U/L   Total Bilirubin 0.5 0.3 - 1.2 mg/dL   GFR calc non Af Amer >60 >60 mL/min   GFR calc Af Amer >60 >60 mL/min   Anion gap 10 5 - 15    Comment: Performed at [facility], [address] [place], [place] [address]  Brain natriuretic peptide     Status: Abnormal   Collection Time: 12/23/18 11:35 PM  Result Value Ref Range   B Natriuretic Peptide 914.0 (H) 0.0 - 100.0 pg/mL    Comment: Performed at [facility], [address] [place], [place] [address]  Troponin I - Once     Status: None   Collection Time: 12/23/18 11:35 PM  Result Value Ref Range   Troponin I <0.03 <0.03 ng/mL    Comment: Performed at [facility], [address] [place], [place] [address]  CBC with Differential     Status: Abnormal   Collection Time: 12/23/18 11:35 PM  Result Value Ref Range   WBC 6.3 4.0 - 10.5 K/uL   RBC 5.01 3.87 - 5.11 MIL/uL   Hemoglobin 14.3 12.0 - 15.0 g/dL   HCT 56.244.4 13.036.0 - 86.546.0 %   MCV 88.6 80.0 - 100.0 fL   MCH 28.5 26.0 - 34.0 pg   MCHC 32.2 30.0 - 36.0 g/dL   RDW 78.413.4 69.611.5 - 29.515.5 %   Platelets 50 (L) 150 - 400 K/uL    Comment: PLATELET COUNT CONFIRMED BY SMEAR SPECIMEN CHECKED FOR CLOTS Immature Platelet Fraction may be clinically indicated, consider ordering this additional test [id number]    nRBC 0.0 0.0 - 0.2 %   Neutrophils Relative % 77 %   Neutro Abs 4.9 1.7 - 7.7 K/uL   Lymphocytes Relative 13 %  Lymphs Abs 0.8 0.7 - 4.0 K/uL   Monocytes Relative 9 %   Monocytes Absolute 0.6 0.1 - 1.0 K/uL   Eosinophils Relative 0 %   Eosinophils Absolute 0.0 0.0 - 0.5 K/uL   Basophils Relative 1 %   Basophils Absolute 0.1 0.0 - 0.1 K/uL   Immature Granulocytes 0 %   Abs Immature Granulocytes 0.02 0.00 - 0.07 K/uL    Comment: Performed at [facility], [address] [place], [place] [address]  Culture, blood (routine x 2)     Status: None (Preliminary result)   Collection Time: 12/23/18 11:35 PM  Result Value Ref Range   Specimen Description BLOOD RIGHT ANTECUBITAL    Special Requests      BOTTLES DRAWN AEROBIC AND ANAEROBIC Blood Culture adequate volume   Culture      NO GROWTH 2 DAYS Performed at [facility], [address], [place], [place] [address]    Report Status PENDING   Influenza panel by PCR (type A & B)     Status: Abnormal   Collection Time: 12/23/18 11:35 PM  Result Value Ref Range   Influenza A By PCR POSITIVE (A) NEGATIVE   Influenza B By PCR NEGATIVE NEGATIVE     Comment: (NOTE) The Xpert Xpress Flu assay is intended as an aid in the diagnosis of  influenza and should not be used as a sole basis for treatment.  This  assay is FDA approved for nasopharyngeal swab specimens only. Nasal  washings and aspirates are unacceptable for Xpert Xpress Flu testing. Performed at [facility], [address] [place], [place] [address]   Protime-INR     Status: Abnormal   Collection Time: 12/23/18 11:35 PM  Result Value Ref Range   Prothrombin Time 18.7 (H) 11.4 - 15.2 seconds   INR 1.58     Comment: Performed at [facility], [address], [place], [place] [address]  HIV antibody (Routine Testing)     Status: None   Collection Time: 12/23/18 11:45 PM  Result Value Ref Range   HIV Screen 4th Generation wRfx Non Reactive Non Reactive    Comment: (NOTE) Performed At: [facility] [address] [place], [place] [id number] Jolene Schimke MD MV:[id number]   Culture, blood (routine x 2)     Status: None (Preliminary result)   Collection Time: 12/23/18 11:50 PM  Result Value Ref Range   Specimen Description BLOOD LEFT ANTECUBITAL    Special Requests      BOTTLES DRAWN AEROBIC AND ANAEROBIC Blood Culture adequate volume   Culture      NO GROWTH 2 DAYS Performed at [facility], [address] [place], [place] [address]    Report Status PENDING   Basic metabolic panel     Status: Abnormal   Collection Time: 12/25/18  5:43 AM  Result Value Ref Range   Sodium 123 (L) 135 - 145 mmol/L   Potassium 4.1 3.5 - 5.1 mmol/L   Chloride 87 (L) 98 - 111 mmol/L   CO2 26 22 - 32 mmol/L   Glucose, Bld 98 70 - 99 mg/dL   BUN 14 6 - 20 mg/dL   Creatinine, Ser 4.40 0.44 - 1.00 mg/dL   Calcium 8.3 (L) 8.9 - 10.3 mg/dL   GFR calc non Af Amer >60 >60 mL/min   GFR calc Af Amer >60 >60 mL/min   Anion gap 10 5 - 15    Comment: Performed at [facility], [address], [place], [place] [address]  CBC     Status: Abnormal   Collection Time: 12/25/18  5:43 AM  Result  Value Ref Range   WBC 9.1 4.0 - 10.5 K/uL   RBC 4.72 3.87 - 5.11 MIL/uL   Hemoglobin 13.7 12.0 - 15.0 g/dL   HCT 96.0 45.4 - 09.8 %   MCV 89.4 80.0 - 100.0 fL   MCH 29.0 26.0 - 34.0 pg   MCHC 32.5 30.0 - 36.0 g/dL   RDW 11.9 14.7 - 82.9 %   Platelets 20 (LL) 150 - 400 K/uL    Comment: REPEATED TO VERIFY PLATELET COUNT CONFIRMED BY SMEAR SPECIMEN CHECKED FOR CLOTS THIS CRITICAL RESULT HAS VERIFIED AND BEEN CALLED TO [facility] BY LATISHA HENDERSON ON 12 27 2019 AT 0656, AND HAS BEEN READ BACK. CRITICAL RESULT VERIFIED    nRBC 0.0 0.0 - 0.2 %    Comment: Performed at [facility], [address] [place], [place] [address]  Protime-INR     Status: Abnormal   Collection Time: 12/25/18  5:43 AM  Result Value Ref Range   Prothrombin Time 18.2 (H) 11.4 - 15.2 seconds   INR 1.52     Comment: Performed at [facility], [address] [place], [place] [address]    ABGS No results for input(s): PHART, PO2ART, TCO2, HCO3 in the last 72 hours.  Invalid input(s): PCO2 CULTURES Recent Results (from the past 240 hour(s))  Culture, blood (routine x 2)     Status: None (Preliminary result)   Collection Time: 12/23/18 11:35 PM  Result Value Ref Range Status   Specimen Description BLOOD RIGHT ANTECUBITAL  Final   Special Requests   Final    BOTTLES DRAWN AEROBIC AND ANAEROBIC Blood Culture adequate volume   Culture   Final    NO GROWTH 2 DAYS Performed at [facility], [address] [place], [place] [address]    Report Status PENDING  Incomplete  Culture, blood (routine x 2)     Status: None (Preliminary result)   Collection Time: 12/23/18 11:50 PM  Result Value Ref Range Status   Specimen Description BLOOD LEFT ANTECUBITAL  Final   Special Requests   Final    BOTTLES DRAWN AEROBIC AND ANAEROBIC Blood Culture adequate volume   Culture   Final    NO GROWTH 2 DAYS Performed at [facility], [address], [place], [place] [address]    Report Status PENDING  Incomplete    Studies/Results: Dg Chest 2 View  Result Date: 12/24/2018 CLINICAL DATA:  Acute onset of cough, shortness of breath and chills. Chest tightness. EXAM: CHEST - 2 VIEW COMPARISON:  Chest radiograph performed 01/14/2018 FINDINGS: The lungs are well-aerated. Vascular congestion is noted. Increased interstitial markings raise concern for mild interstitial edema, though pneumonia could have a similar appearance. Peribronchial thickening is noted. There is no evidence of pleural effusion or pneumothorax. The heart is mildly enlarged. A pacemaker/AICD is noted at the left chest wall, with leads ending at the right atrium and right ventricle. Lumbar spinal fusion hardware is partially imaged. No acute osseous abnormalities are seen. IMPRESSION: Vascular congestion and mild cardiomegaly. Increased interstitial markings raise concern for mild interstitial edema, though pneumonia could have a similar appearance. Peribronchial thickening noted. Electronically Signed   By: Roanna Raider M.D.   On: 12/24/2018 00:16    Medications:  Prior to Admission:  Medications Prior to Admission  Medication Sig Dispense Refill Last Dose  . aspirin EC 81 MG tablet Take 81 mg by mouth daily.  12/23/2018 at 800  . aspirin-acetaminophen-caffeine (EXCEDRIN MIGRAINE) 250-250-65 MG tablet Take 2 tablets by mouth once as needed for headache or migraine.   Taking  . carvedilol (COREG) 12.5 MG tablet TAKE 1 TABLET BY MOUTH TWICE DAILY WITH MEALS 180 tablet 3 12/23/2018 at 2100  . clonazePAM (KLONOPIN) 1 MG tablet Take 1 mg by mouth 4 (four) times daily as needed for anxiety.   12/23/2018 at Unknown time  . DULoxetine (CYMBALTA) 60 MG capsule Take 60 mg by mouth every morning.   12/23/2018 at Unknown time  . furosemide (LASIX) 40 MG tablet TAKE 1 TABLET BY MOUTH DAILY AS NEEDED FOR SWELLING. 30 tablet 6   . furosemide (LASIX) 80 MG tablet TAKE 1 TABLET BY MOUTH DAILY 90 tablet 1 12/23/2018 at Unknown time  . gabapentin (NEURONTIN)  300 MG capsule Take 300 mg by mouth 2 (two) times daily.   12/23/2018 at Unknown time  . HYDROcodone-acetaminophen (NORCO) 10-325 MG per tablet Take 0.5 tablets by mouth 4 (four) times daily as needed for moderate pain or severe pain. pain   12/23/2018 at Unknown time  . isosorbide mononitrate (IMDUR) 30 MG 24 hr tablet TAKE 1 TABLET BY MOUTH DAILY 90 tablet 1 12/23/2018 at Unknown time  . losartan (COZAAR) 25 MG tablet TAKE 1/2 TABLET BY MOUTH EVERY DAY 45 tablet 1 12/23/2018 at Unknown time  . Multiple Vitamins-Minerals (MULTIVITAMINS THER. W/MINERALS) TABS Take 1 tablet by mouth daily.     12/23/2018 at Unknown time  . nicotine (NICODERM CQ - DOSED IN MG/24 HR) 7 mg/24hr patch Place 1 patch (7 mg total) onto the skin daily. 28 patch 0 Taking  . nitroGLYCERIN (NITROSTAT) 0.4 MG SL tablet PLACE 1 TABLET UNDER THE TONGUE EVERY 5 MINUTES FOR 3 DOSES AS NEEDED CHEST PAIN 25 tablet 3 Taking  . potassium chloride SA (K-DUR,KLOR-CON) 20 MEQ tablet TAKE 1 TABLET BY MOUTH EVERY DAY 90 tablet 3 12/23/2018 at Unknown time  . promethazine (PHENERGAN) 25 MG tablet Take 25 mg by mouth every 6 (six) hours as needed for nausea.   Taking  . ranitidine (ZANTAC) 150 MG tablet Take 150 mg by mouth 2 (two) times daily.   12/23/2018 at Unknown time  . spironolactone (ALDACTONE) 25 MG tablet TAKE 1 TABLET BY MOUTH EVERY DAY 90 tablet 1 12/23/2018 at Unknown time  . warfarin (COUMADIN) 10 MG tablet Take 1 tablet daily except 1/2 tablet on Thursdays (Patient taking differently: Take 5-10 mg by mouth See admin instructions. TAKES 5MG  ON TUESDAYS AND THURSDAYS AND TAKES 10MG  ON ALL OTHER DAYS) 30 tablet 3 12/23/2018 at 800   Scheduled: . aspirin EC  81 mg Oral Daily  . carvedilol  12.5 mg Oral BID WC  . DULoxetine  60 mg Oral q morning - 10a  . furosemide  40 mg Intravenous Q12H  . gabapentin  300 mg Oral BID  . guaiFENesin  1,200 mg Oral BID  . isosorbide mononitrate  30 mg Oral Daily  . losartan  12.5 mg Oral Daily   . multivitamin with minerals  1 tablet Oral Daily  . oseltamivir  75 mg Oral BID  . potassium chloride  20 mEq Oral BID  . sodium chloride flush  3 mL Intravenous Q12H  . spironolactone  25 mg Oral Daily  . Warfarin - Pharmacist Dosing Inpatient   Does not apply q1800   Continuous:  [id number] **OR** acetaminophen, clonazePAM, HYDROcodone-acetaminophen, loperamide, nitroGLYCERIN, ondansetron **OR** ondansetron (ZOFRAN) IV, polyethylene glycol  Assesment: She was admitted  with influenza A and mild exacerbation of heart failure.  She now has what appears to be some bronchitis as well.  She has a long smoking history so undoubtedly has some element of COPD.  She has coronary disease which is stable  She has previous stroke which is stable  She has heart failure and has had cardiac arrhythmias and has an implantable defibrillator which is stable with no episodes of it firing  She had low platelets and that is gone even further down. Active Problems:   H. influenzae infection   Influenza A    Plan: Add a antibiotic for bacterial disease.  Request hematology consultation regarding the platelet count.    LOS: 1 day   Fredirick Maudlin 12/25/2018, 8:21 AM

## 2018-12-25 NOTE — Consult Note (Signed)
Referring Physician:  Dr. [name].    Reason for consultation:  Thrombocytopenia  Diagnosis Acute on chronic congestive heart failure, unspecified heart failure type (HCC)  Hypoxia  Influenza A  Staging Cancer Staging No matching staging information was found for the patient.  HPI:  57 year old [name] admitted 12/23/2018 by Dr.  [name] due to fever with productive cough.  She was reportedly  At a family gathering with several sick family members.  She subsequently developed fever and aches. CXR done 12/24/2018 showed heart failure Versus pneumonia.  She had flu testing done on 12/23/2018 that was + for influenza A.  HIV testing was negative.  Blood cultures obtained 12/23/2018 showed no growth.    Pt was not treated with heparin.  She was on chronic coumadin.    Labs done 12/23/2018 reviewed and showed WBC 6.3 HB 14.3 plts 50,000.  She has a normal differential.  Chemistries WNL with K+ 3.6.  Cr 0.76 and normal LFTs.  PT 18.7 with  INR of 1.6.    Pt had repeat labs done 12/25/2018 that showed WBC 9.1 HB 13.7 plts 20,000.  Chemistries WNL with K+ 3.6 Cr 0.72 and normal LFTs.  LDH 156, PTT 60.  Smear review shows thrombocytopenia.    Hematology consulted for evaluation due to thrombocytopenia.    Past Medical History Past Medical History:  Diagnosis Date  . Aneurysm (HCC)    internal carotid artery  . Anticardiolipin antibody positive    Chronic Coumadin  . Chronic combined systolic (congestive) and diastolic (congestive) heart failure (HCC)   . Cocaine abuse in remission (HCC)   . Coronary atherosclerosis of native coronary artery    Previous invasive cardiac testing was done at a facility in [place], [place].  Occluded diagonal, Diffuse LAD disease, Occluded Cx, and non obstructive RCA.   . Essential hypertension, benign   . Headache   . History of pneumonia   . Ischemic cardiomyopathy    LVEF 30-35%  . Mixed hyperlipidemia   . PVD (peripheral vascular disease) (HCC)    . Raynaud's phenomenon   . Stroke [facility]) 2007   Total occlusion of the right internal carotid    Past Surgical History Past Surgical History:  Procedure Laterality Date  . ABDOMINAL AORTAGRAM N/A 05/25/2014   Procedure: ABDOMINAL Ronny Flurry;  Surgeon: Iran Ouch, MD;  Location: [facility] CATH LAB;  Service: Cardiovascular;  Laterality: N/A;  . APPENDECTOMY    . CARDIAC DEFIBRILLATOR PLACEMENT     St.Jude ICD  . EP IMPLANTABLE DEVICE N/A 03/01/2016   Procedure: ICD Generator Changeout;  Surgeon: [name], MD;  Location: [facility] INVASIVE CV LAB;  Service: Cardiovascular;  Laterality: N/A;  . L1 corpectomy   12/10  . Laryngeal polyp excision      Family History Family History  Problem Relation Age of Onset  . Heart disease Father 15  . Ankylosing spondylitis Sister   . Stomach cancer Brother 24  . Heart attack Brother      Social History  reports that she has been smoking e-cigarettes. She started smoking about 40 years ago. She has a 43.00 pack-year smoking history. She has never used smokeless tobacco. She reports that she does not drink alcohol or use drugs.  Medications Prior to Admission medications   Medication Sig Start Date End Date Taking? Authorizing Provider  aspirin EC 81 MG tablet Take 81 mg by mouth daily.   Yes [provider]  aspirin-acetaminophen-caffeine (EXCEDRIN MIGRAINE) [phone] MG tablet Take 2 tablets  by mouth once as needed for headache or migraine.   Yes [provider]  carvedilol (COREG) 12.5 MG tablet TAKE 1 TABLET BY MOUTH TWICE DAILY WITH MEALS 02/20/18  Yes [name], MD  clonazePAM (KLONOPIN) 1 MG tablet Take 1 mg by mouth 4 (four) times daily as needed for anxiety.   Yes [provider]  DULoxetine (CYMBALTA) 60 MG capsule Take 60 mg by mouth every morning.   Yes [provider]  furosemide (LASIX) 40 MG tablet TAKE 1 TABLET BY MOUTH DAILY AS NEEDED FOR SWELLING. 12/21/18  Yes [name], MD   furosemide (LASIX) 80 MG tablet TAKE 1 TABLET BY MOUTH DAILY 07/20/18  Yes [name], MD  gabapentin (NEURONTIN) 300 MG capsule Take 300 mg by mouth 2 (two) times daily.   Yes [provider]  HYDROcodone-acetaminophen (NORCO) 10-325 MG per tablet Take 0.5 tablets by mouth 4 (four) times daily as needed for moderate pain or severe pain. pain   Yes [provider]  isosorbide mononitrate (IMDUR) 30 MG 24 hr tablet TAKE 1 TABLET BY MOUTH DAILY 07/20/18  Yes [name], MD  losartan (COZAAR) 25 MG tablet TAKE 1/2 TABLET BY MOUTH EVERY DAY 07/08/18  Yes [name], MD  Multiple Vitamins-Minerals (MULTIVITAMINS THER. W/MINERALS) TABS Take 1 tablet by mouth daily.     Yes [provider]  nicotine (NICODERM CQ - DOSED IN MG/24 HR) 7 mg/24hr patch Place 1 patch (7 mg total) onto the skin daily. 06/19/17  Yes Kari Baars, MD  nitroGLYCERIN (NITROSTAT) 0.4 MG SL tablet PLACE 1 TABLET UNDER THE TONGUE EVERY 5 MINUTES FOR 3 DOSES AS NEEDED CHEST PAIN 11/30/18  Yes [name], MD  potassium chloride SA (K-DUR,KLOR-CON) 20 MEQ tablet TAKE 1 TABLET BY MOUTH EVERY DAY 07/30/18  Yes [name], MD  promethazine (PHENERGAN) 25 MG tablet Take 25 mg by mouth every 6 (six) hours as needed for nausea.   Yes [provider]  ranitidine (ZANTAC) 150 MG tablet Take 150 mg by mouth 2 (two) times daily.   Yes [provider]  spironolactone (ALDACTONE) 25 MG tablet TAKE 1 TABLET BY MOUTH EVERY DAY 07/20/18  Yes [name], MD  warfarin (COUMADIN) 10 MG tablet Take 1 tablet daily except 1/2 tablet on Thursdays Patient taking differently: Take 5-10 mg by mouth See admin instructions. TAKES 5MG  ON TUESDAYS AND THURSDAYS AND TAKES 10MG  ON ALL OTHER DAYS 05/22/17  Yes [name], MD    Allergies Metoclopramide hcl; Penicillins; Metoprolol; and Statins  Review of Systems Review of Systems - Oncology ROS negative other than URI  symptoms.     Physical Exam  Vitals Wt Readings from Last 3 Encounters:  12/25/18 223 lb 15.8 oz (101.6 kg)  12/02/18 225 lb (102.1 kg)  06/25/18 226 lb (102.5 kg)   Temp Readings from Last 3 Encounters:  12/25/18 97.7 F (36.5 C) (Oral)  06/19/17 97.7 F (36.5 C) (Oral)  10/10/16 98 F (36.7 C) (Oral)   BP Readings from Last 3 Encounters:  12/25/18 111/64  12/02/18 114/78  06/25/18 104/71   Pulse Readings from Last 3 Encounters:  12/25/18 71  12/02/18 78  06/25/18 70   Constitutional: Well-developed, well-nourished, and in no distress.   HENT: Head: Normocephalic and atraumatic.  Mouth/Throat: No oropharyngeal exudate. Mucosa moist. Eyes: Pupils are equal, round, and reactive to light. Conjunctivae are normal. No scleral icterus.  Neck: Normal range of motion. Neck supple.  No JVD present.  Cardiovascular: Normal rate, regular rhythm and normal heart sounds.  Exam reveals no gallop and no friction rub.   No murmur heard. Pulmonary/Chest: Effort normal and breath sounds normal. No respiratory distress. No wheezes.No rales.  Abdominal: Soft. Bowel sounds are normal. No distension. There is no tenderness. There is no guarding.  Musculoskeletal: No edema or tenderness.  Lymphadenopathy: No cervical, axillary or supraclavicular adenopathy.  Neurological: Alert and oriented to person, place, and time. No cranial nerve deficit.  Skin: Skin is warm and dry. No rash noted. No erythema. No pallor.  Psychiatric: Affect and judgment normal.   Labs Admission on 12/23/2018  Component Date Value Ref Range Status  . Sodium 12/23/2018 127* 135 - 145 mmol/L Final  . Potassium 12/23/2018 3.6  3.5 - 5.1 mmol/L Final  . Chloride 12/23/2018 94* 98 - 111 mmol/L Final  . CO2 12/23/2018 23  22 - 32 mmol/L Final  . Glucose, Bld 12/23/2018 98  70 - 99 mg/dL Final  . BUN 60/73/7106 9  6 - 20 mg/dL Final  . Creatinine, Ser 12/23/2018 0.76  0.44 - 1.00 mg/dL Final  . Calcium 26/94/8546 8.5*  8.9 - 10.3 mg/dL Final  . Total Protein 12/23/2018 8.3* 6.5 - 8.1 g/dL Final  . Albumin 27/02/5008 3.9  3.5 - 5.0 g/dL Final  . AST 38/18/2993 34  15 - 41 U/L Final  . ALT 12/23/2018 28  0 - 44 U/L Final  . Alkaline Phosphatase 12/23/2018 58  38 - 126 U/L Final  . Total Bilirubin 12/23/2018 0.5  0.3 - 1.2 mg/dL Final  . GFR calc non Af Amer 12/23/2018 >60  >60 mL/min Final  . GFR calc Af Amer 12/23/2018 >60  >60 mL/min Final  . Anion gap 12/23/2018 10  5 - 15 Final   Performed at [facility], [address], [place], [place] [address]  . B Natriuretic Peptide 12/23/2018 914.0* 0.0 - 100.0 pg/mL Final   Performed at [facility], [address] [place], [place] [address]  . Troponin I 12/23/2018 <0.03  <0.03 ng/mL Final   Performed at [facility], [address], [place], [place] [address]  . WBC 12/23/2018 6.3  4.0 - 10.5 K/uL Final  . RBC 12/23/2018 5.01  3.87 - 5.11 MIL/uL Final  . Hemoglobin 12/23/2018 14.3  12.0 - 15.0 g/dL Final  . HCT 10/23/8526 44.4  36.0 - 46.0 % Final  . MCV 12/23/2018 88.6  80.0 - 100.0 fL Final  . MCH 12/23/2018 28.5  26.0 - 34.0 pg Final  . MCHC 12/23/2018 32.2  30.0 - 36.0 g/dL Final  . RDW 78/24/2353 13.4  11.5 - 15.5 % Final  . Platelets 12/23/2018 50* 150 - 400 K/uL Final   Comment: PLATELET COUNT CONFIRMED BY SMEAR SPECIMEN CHECKED FOR CLOTS Immature Platelet Fraction may be clinically indicated, consider ordering this additional test [id number]   . nRBC 12/23/2018 0.0  0.0 - 0.2 % Final  . Neutrophils Relative % 12/23/2018 77  % Final  . Neutro Abs 12/23/2018 4.9  1.7 - 7.7 K/uL Final  . Lymphocytes Relative 12/23/2018 13  % Final  . Lymphs Abs 12/23/2018 0.8  0.7 - 4.0 K/uL Final  . Monocytes Relative 12/23/2018 9  % Final  . Monocytes Absolute 12/23/2018 0.6  0.1 - 1.0 K/uL Final  . Eosinophils Relative 12/23/2018 0  % Final  . Eosinophils Absolute 12/23/2018 0.0  0.0 - 0.5 K/uL Final  . Basophils Relative 12/23/2018 1  % Final  .  Basophils  Absolute 12/23/2018 0.1  0.0 - 0.1 K/uL Final  . Immature Granulocytes 12/23/2018 0  % Final  . Abs Immature Granulocytes 12/23/2018 0.02  0.00 - 0.07 K/uL Final   Performed at [facility], [address] [place], [place] [address]  . Specimen Description 12/23/2018 BLOOD RIGHT ANTECUBITAL   Final  . Special Requests 12/23/2018 BOTTLES DRAWN AEROBIC AND ANAEROBIC Blood Culture adequate volume   Final  . Culture 12/23/2018    Final                   Value:NO GROWTH 2 DAYS Performed at [facility], [address], [place], [place] [address]   . Report Status 12/23/2018 PENDING   Incomplete  . Specimen Description 12/23/2018 BLOOD LEFT ANTECUBITAL   Final  . Special Requests 12/23/2018 BOTTLES DRAWN AEROBIC AND ANAEROBIC Blood Culture adequate volume   Final  . Culture 12/23/2018    Final                   Value:NO GROWTH 2 DAYS Performed at [facility], [address], [place], [place] [address]   . Report Status 12/23/2018 PENDING   Incomplete  . Influenza A By PCR 12/23/2018 POSITIVE* NEGATIVE Final  . Influenza B By PCR 12/23/2018 NEGATIVE  NEGATIVE Final   Comment: (NOTE) The Xpert Xpress Flu assay is intended as an aid in the diagnosis of  influenza and should not be used as a sole basis for treatment.  This  assay is FDA approved for nasopharyngeal swab specimens only. Nasal  washings and aspirates are unacceptable for Xpert Xpress Flu testing. Performed at [facility], [address], [place], [place] [address]   . Prothrombin Time 12/23/2018 18.7* 11.4 - 15.2 seconds Final  . INR 12/23/2018 1.58   Final   Performed at [facility], [address] [place], [place] [address]  . HIV Screen 4th Generation wRfx 12/23/2018 Non Reactive  Non Reactive Final   Comment: (NOTE) Performed At: [facility] [address] [place], [place] [id number] Jolene Schimke MD ON:[id number]   . Sodium 12/25/2018 123* 135 - 145 mmol/L Final  . Potassium 12/25/2018 4.1  3.5 - 5.1 mmol/L  Final  . Chloride 12/25/2018 87* 98 - 111 mmol/L Final  . CO2 12/25/2018 26  22 - 32 mmol/L Final  . Glucose, Bld 12/25/2018 98  70 - 99 mg/dL Final  . BUN 44/12/270 14  6 - 20 mg/dL Final  . Creatinine, Ser 12/25/2018 0.70  0.44 - 1.00 mg/dL Final  . Calcium 53/66/4403 8.3* 8.9 - 10.3 mg/dL Final  . GFR calc non Af Amer 12/25/2018 >60  >60 mL/min Final  . GFR calc Af Amer 12/25/2018 >60  >60 mL/min Final  . Anion gap 12/25/2018 10  5 - 15 Final   Performed at [facility], [address], [place], [place] [address]  . WBC 12/25/2018 9.1  4.0 - 10.5 K/uL Final  . RBC 12/25/2018 4.72  3.87 - 5.11 MIL/uL Final  . Hemoglobin 12/25/2018 13.7  12.0 - 15.0 g/dL Final  . HCT 95/63/8756 42.2  36.0 - 46.0 % Final  . MCV 12/25/2018 89.4  80.0 - 100.0 fL Final  . MCH 12/25/2018 29.0  26.0 - 34.0 pg Final  . MCHC 12/25/2018 32.5  30.0 - 36.0 g/dL Final  . RDW 43/32/9518 12.8  11.5 - 15.5 % Final  . Platelets 12/25/2018 20* 150 - 400 K/uL Final   Comment: REPEATED TO VERIFY PLATELET  COUNT CONFIRMED BY SMEAR SPECIMEN CHECKED FOR CLOTS THIS CRITICAL RESULT HAS VERIFIED AND BEEN CALLED TO GRAVES K BY LATISHA HENDERSON ON 12 27 2019 AT 0656, AND HAS BEEN READ BACK. CRITICAL RESULT VERIFIED   . nRBC 12/25/2018 0.0  0.0 - 0.2 % Final   Performed at [facility], [address] [place], [place] [address]  . Prothrombin Time 12/25/2018 18.2* 11.4 - 15.2 seconds Final  . INR 12/25/2018 1.52   Final   Performed at [facility], [address] [place], [place] [address]  . Unit Number 12/25/2018 [id number]   Final  . Blood Component Type 12/25/2018 PLTP LR1 PAS   Final  . Unit division 12/25/2018 00   Final  . Status of Unit 12/25/2018 ALLOCATED   Final  . Transfusion Status 12/25/2018    Final                   Value:OK TO TRANSFUSE Performed at [facility], [address] [place], [place] [address]   . aPTT 12/25/2018 60* 24 - 36 seconds Final   Comment:        IF BASELINE aPTT IS  ELEVATED, SUGGEST PATIENT RISK ASSESSMENT BE USED TO DETERMINE APPROPRIATE ANTICOAGULANT THERAPY. Performed at [facility], [address] [place], [place] [address]   . LDH 12/25/2018 156  98 - 192 U/L Final   Performed at [facility], [address] [place], [place] [address]  . Sodium 12/25/2018 123* 135 - 145 mmol/L Final  . Potassium 12/25/2018 3.6  3.5 - 5.1 mmol/L Final  . Chloride 12/25/2018 88* 98 - 111 mmol/L Final  . CO2 12/25/2018 26  22 - 32 mmol/L Final  . Glucose, Bld 12/25/2018 134* 70 - 99 mg/dL Final  . BUN 42/59/563812/27/2019 15  6 - 20 mg/dL Final  . Creatinine, Ser 12/25/2018 0.72  0.44 - 1.00 mg/dL Final  . Calcium 75/64/332912/27/2019 8.1* 8.9 - 10.3 mg/dL Final  . Total Protein 12/25/2018 6.8  6.5 - 8.1 g/dL Final  . Albumin 51/88/416612/27/2019 3.2* 3.5 - 5.0 g/dL Final  . AST 06/30/160112/27/2019 37  15 - 41 U/L Final  . ALT 12/25/2018 29  0 - 44 U/L Final  . Alkaline Phosphatase 12/25/2018 43  38 - 126 U/L Final  . Total Bilirubin 12/25/2018 0.4  0.3 - 1.2 mg/dL Final  . GFR calc non Af Amer 12/25/2018 >60  >60 mL/min Final  . GFR calc Af Amer 12/25/2018 >60  >60 mL/min Final  . Anion gap 12/25/2018 9  5 - 15 Final   Performed at [facility], [address] [place], [place] [address]  . ABO/RH(D) 12/25/2018    Final                   Value:A POS Performed at [facility], [address] [place], [place] [address]   . Blood Product Unit Number 12/25/2018 [id number]   Final  . Unit Type and Rh 12/25/2018 5100   Final  . Blood Product Expiration Date 12/25/2018 [id number]   Final     Pathology Orders Placed This Encounter  Procedures  . Critical Care    This order was created via procedure documentation    Standing Status:   Standing    Number of Occurrences:   1  . Culture, blood (routine x 2)    Standing Status:   Standing    Number of Occurrences:   2  . DG Chest 2 View    Standing  Status:   Standing    Number of Occurrences:   1    Order Specific Question:   Reason for  Exam (SYMPTOM  OR DIAGNOSIS REQUIRED)    Answer:   cough, SOB, chills, hx of CHF  . Comprehensive metabolic panel    Standing Status:   Standing    Number of Occurrences:   1  . Brain natriuretic peptide    Standing Status:   Standing    Number of Occurrences:   1  . Troponin I - Once    Standing Status:   Standing    Number of Occurrences:   1  . CBC with Differential    Standing Status:   Standing    Number of Occurrences:   1  . Influenza panel by PCR (type A & B)    Standing Status:   Standing    Number of Occurrences:   1  . Protime-INR    Standing Status:   Standing    Number of Occurrences:   1  . HIV antibody (Routine Testing)    Standing Status:   Standing    Number of Occurrences:   1  . Basic metabolic panel    Standing Status:   Standing    Number of Occurrences:   1  . CBC    Standing Status:   Standing    Number of Occurrences:   1  . Protime-INR    Standing Status:   Standing    Number of Occurrences:   9  . CBC with Differential/Platelet    Standing Status:   Standing    Number of Occurrences:   1  . APTT    Standing Status:   Standing    Number of Occurrences:   1  . Lactate dehydrogenase    Standing Status:   Standing    Number of Occurrences:   1  . Comprehensive metabolic panel    Standing Status:   Standing    Number of Occurrences:   1  . Diet Heart Room service appropriate? Yes; Fluid consistency: Thin    No red meat. 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    Answer:   Yes    Order Specific Question:   Fluid consistency:    Answer:   Thin  . Check Pulse Oximetry while ambulating    Standing Status:   Standing    Number of Occurrences:   1  . Cardiac monitoring    Standing Status:   Standing    Number of Occurrences:   1  . Vital signs    Standing Status:   Standing    Number of Occurrences:   1  . Notify physician     Standing Status:   Standing    Number of Occurrences:   20    Order Specific Question:   Notify Physician    Answer:   for pulse less than 55 or greater than 120    Order Specific Question:   Notify Physician    Answer:   for respiratory rate less than 12 or greater than 25    Order Specific Question:   Notify Physician    Answer:   for temperature greater than 100.5 F    Order Specific Question:   Notify Physician    Answer:  for urinary output less than 30 mL/kg/hr for four hours    Order Specific Question:   Notify Physician    Answer:   for systolic BP less than 90 or greater than 160, diastolic BP less than 60 or greater than 100  . Initiate Oral Care Protocol    Standing Status:   Standing    Number of Occurrences:   1  . Initiate Carrier Fluid Protocol    Standing Status:   Standing    Number of Occurrences:   1  . RN may order General Admission PRN Orders utilizing "General Admission PRN medications" (through manage orders) for the following patient needs: allergy symptoms (Claritin), cold sores (Carmex), cough (Robitussin DM), eye irritation (Liquifilm Tears), hemorrhoids (Tucks), indigestion (Maalox), minor skin irritation (Hydrocortisone Cream), muscle pain Romeo Apple Gay), nose irritation (saline nasal spray) and sore throat (Chloraseptic spray).    Standing Status:   Standing    Number of Occurrences:   [id number]  . SCDs    Standing Status:   Standing    Number of Occurrences:   1    Order Specific Question:   Laterality    Answer:   Bilateral  . Cardiac Monitoring - Continuous Indefinite    Standing Status:   Standing    Number of Occurrences:   1    Order Specific Question:   Indications for use:    Answer:   Arrhythmia (hemodynamically unstable, LQTS, or WPW)  . Daily weights    Standing Status:   Standing    Number of Occurrences:   1  . Maintain IV access    Standing Status:   Standing    Number of Occurrences:   1  . Practitioner attestation of consent    I, the  ordering practitioner, attest that I have discussed with the patient the benefits, risks, side effects, alternatives, likelihood of achieving goals and potential problems during recovery for the procedure listed.    Standing Status:   Standing    Number of Occurrences:   1    Order Specific Question:   Procedure    Answer:   Blood product(s)  . Complete patient signature process for consent form    Standing Status:   Standing    Number of Occurrences:   1  . Full code    Standing Status:   Standing    Number of Occurrences:   1  . Consult to hospitalist    Standing Status:   Standing    Number of Occurrences:   1    Order Specific Question:   Place call to:    Answer:   [employer] Question:   Reason for Consult    Answer:   Admit  . Inpatient consult to spiritual care    Standing Status:   Standing    Number of Occurrences:   1    Order Specific Question:   Reason for Consult    Answer:   Create or update advance directive  . warfarin (COUMADIN) per pharmacy consult    Standing Status:   Standing    Number of Occurrences:   1    Order Specific Question:   Indication:    Answer:   Atrial Fibrillation  . Consult to oncology Consult Timeframe: ROUTINE - requires response within 24 hours; Reason for Consult? Thrombocytopenia    Standing Status:   Standing    Number of Occurrences:   1    Order Specific  Question:   Consult Timeframe    Answer:   ROUTINE - requires response within 24 hours    Order Specific Question:   Reason for Consult?    Answer:   Thrombocytopenia  . Droplet precaution (Flu, Meningitis)    Standing Status:   Standing    Number of Occurrences:   1  . Oxygen therapy Mode or (Route): Nasal cannula; FiO2: 28%; Liters Per Minute: 2; Keep 02 saturation: greater than 92 %    Standing Status:   Standing    Number of Occurrences:   1    Order Specific Question:   Mode or (Route)    Answer:   Nasal cannula    Order Specific Question:   FiO2     Answer:   28%    Order Specific Question:   Liters Per Minute    Answer:   2    Order Specific Question:   Keep 02 saturation    Answer:   greater than 92 %  . Flutter valve    Standing Status:   Standing    Number of Occurrences:   20  . ED EKG    Standing Status:   Standing    Number of Occurrences:   1    Order Specific Question:   Reason for Exam    Answer:   Shortness of breath  . EKG 12-Lead    Standing Status:   Standing    Number of Occurrences:   1  . EKG  . Prepare Pheresed Platelets    Standing Status:   Standing    Number of Occurrences:   1    Order Specific Question:   Number of Apheresis Units    Answer:   1 unit (6-10 packs)    Order Specific Question:   Transfusion Indications    Answer:   PLT Count </=10,000/mm    Order Specific Question:   If emergent release call blood bank    Answer:   Not emergent release  . ABO/Rh    Standing Status:   Standing    Number of Occurrences:   1  . Saline lock IV    Standing Status:   Standing    Number of Occurrences:   1  . Place in observation (patient's expected length of stay will be less than 2 midnights)    Standing Status:   Standing    Number of Occurrences:   1    Order Specific Question:   Hospital Area    Answer:   [facility] [[id number]]    Order Specific Question:   Level of Care    Answer:   Telemetry [5]    Order Specific Question:   Diagnosis    Answer:   H. influenzae infection [191478]    Order Specific Question:   Admitting Physician    Answer:   John Giovanni [[id number]]    Order Specific Question:   Attending Physician    Answer:   John Giovanni [[id number]]    Order Specific Question:   PT Class (Do Not Modify)    Answer:   Observation [104]    Order Specific Question:   PT Acc Code (Do Not Modify)    Answer:   Observation [10022]  . Admit to Inpatient (patient's expected length of stay will be greater than 2 midnights or inpatient only procedure)    Standing Status:   Standing     Number of Occurrences:   1  Order Specific Question:   Hospital Area    Answer:   [facility] [[id number]]    Order Specific Question:   Level of Care    Answer:   Telemetry [5]    Order Specific Question:   Diagnosis    Answer:   Influenza A [295621]    Order Specific Question:   Admitting Physician    Answer:   Kari Baars [[id number]]    Order Specific Question:   Attending Physician    Answer:   Kari Baars [[id number]]    Order Specific Question:   Estimated length of stay    Answer:   past midnight tomorrow    Order Specific Question:   Certification:    Answer:   I certify this patient will need inpatient services for at least 2 midnights    Order Specific Question:   PT Class (Do Not Modify)    Answer:   Inpatient [101]    Order Specific Question:   PT Acc Code (Do Not Modify)    Answer:   Private [1]      Assessment and Plan:  1.  Thrombocytopenia.  57 year old [name] admitted 12/23/2018 by Dr.  [name] due to fever with productive cough.  She was reportedly  At a family gathering with several sick family members.  She subsequently developed fever and aches. CXR done 12/24/2018 showed heart failure versus pneumonia.   She had flu testing done on 12/23/2018 that was + for influenza A.  HIV testing was negative.  Blood cultures obtained 12/23/2018 showed no growth.    Pt was not treated with heparin.  She was on chronic coumadin.  Pt has been given ASA.    Labs done 12/23/2018 reviewed and showed WBC 6.3 HB 14.3 plts 50,000.  She has a normal differential.  Chemistries WNL with K+ 3.6.  Cr 0.76 and normal LFTs.  PT 18.7 with  INR of 1.6.    Pt had repeat labs done 12/25/2018 that showed WBC 9.1 HB 13.7 plts 20,000.  Chemistries WNL with K+ 3.6 Cr 0.72 and normal LFTs.  LDH 156, PTT 60.  Smear review shows thrombocytopenia.    Hematology consulted for evaluation due to thrombocytopenia.    Suspect etiology due to infection.  Recommend to continue to support with plt  transfusions if plt count less than 20,000 or bleeding. Pt should have daily labs per primary team.  Have spoken with hospital pharmacy to discontinue ASA until plt count improved greater than 50,000.  Will continue to monitor for improvement.  Dr. Lorelle Formosa will follow-up with pt next week if she remains hospitalized.  Will arrange for outpatient follow-up once pt stable for discharge.   2.  Influenza.  Pt on precautions.  Pulmonary following.    3.  Atrial fibrillation.  Due to thrombocytopenia, will need to have close monitoring of anticoagulation due to bleeding risk with thrombocytopenia.  I have spoken with pharmacy to discontinue ASA until plt count greater than 50,000.    4.  HTN.  BP is 111/64.  Follow-up with PCP.    5.  Hyponatremia.  Cr 0.7.  Sodium level 123  Primary team to manage.    6.  Smoking.  Pt has long smoking history.  Recent CXR showed heart failure versus pneumonia.  Pt is followed by Dr. [name].  Cessation recommended.    Greater than 40 minutes spent with more than 50% in review of records, counseling and coordination of care.

## 2018-12-26 LAB — CBC WITH DIFFERENTIAL/PLATELET
Abs Immature Granulocytes: 0.01 10*3/uL (ref 0.00–0.07)
Basophils Absolute: 0 10*3/uL (ref 0.0–0.1)
Basophils Relative: 1 %
Eosinophils Absolute: 0.3 10*3/uL (ref 0.0–0.5)
Eosinophils Relative: 6 %
HCT: 38.8 % (ref 36.0–46.0)
Hemoglobin: 12.5 g/dL (ref 12.0–15.0)
Immature Granulocytes: 0 %
Lymphocytes Relative: 30 %
Lymphs Abs: 1.3 10*3/uL (ref 0.7–4.0)
MCH: 29 pg (ref 26.0–34.0)
MCHC: 32.2 g/dL (ref 30.0–36.0)
MCV: 90 fL (ref 80.0–100.0)
Monocytes Absolute: 0.5 10*3/uL (ref 0.1–1.0)
Monocytes Relative: 12 %
Neutro Abs: 2.3 10*3/uL (ref 1.7–7.7)
Neutrophils Relative %: 51 %
Platelets: 26 10*3/uL — CL (ref 150–400)
RBC: 4.31 MIL/uL (ref 3.87–5.11)
RDW: 12.5 % (ref 11.5–15.5)
WBC: 4.5 10*3/uL (ref 4.0–10.5)
nRBC: 0 % (ref 0.0–0.2)

## 2018-12-26 LAB — BPAM PLATELET PHERESIS
Blood Product Expiration Date: 201912282359
ISSUE DATE / TIME: 201912271750
Unit Type and Rh: 5100

## 2018-12-26 LAB — PROTIME-INR
INR: 1.58
Prothrombin Time: 18.7 seconds — ABNORMAL HIGH (ref 11.4–15.2)

## 2018-12-26 LAB — PREPARE PLATELET PHERESIS: Unit division: 0

## 2018-12-26 MED ORDER — DOXYCYCLINE HYCLATE 100 MG PO CAPS: 100.0000 mg | ORAL_CAPSULE | Freq: Two times a day (BID) | ORAL | 0 refills | Status: DC

## 2018-12-26 MED ORDER — WARFARIN SODIUM 5 MG PO TABS
5.0000 mg | ORAL_TABLET | Freq: Once | ORAL | Status: AC
  Administered 2018-12-26: 5 mg via ORAL
  Filled 2018-12-26: qty 1

## 2018-12-26 MED ORDER — OSELTAMIVIR PHOSPHATE 75 MG PO CAPS: 75.0000 mg | ORAL_CAPSULE | Freq: Two times a day (BID) | ORAL | 0 refills | Status: DC

## 2018-12-26 NOTE — Progress Notes (Signed)
Subjective: She says she feels okay.  No new complaints.  Platelet count has come up some.  She is still hyponatremic.  Despite all that she says she feels much better and wants to go home.  Objective: Vital signs in last 24 hours: Temp:  [97.7 F (36.5 C)-98.4 F (36.9 C)] 98.4 F (36.9 C) (12/28 0522) Pulse Rate:  [69-77] 69 (12/28 0522) Resp:  [16-18] 18 (12/28 0522) BP: (72-111)/(52-67) 72/53 (12/28 0522) SpO2:  [94 %-99 %] 94 % (12/28 0522) Weight change:  Last BM Date: 12/23/18  Intake/Output from previous day: No intake/output data recorded.  PHYSICAL EXAM General appearance: alert, cooperative and no distress Resp: rhonchi bilaterally Cardio: regular rate and rhythm, S1, S2 normal, no murmur, click, rub or gallop GI: soft, non-tender; bowel sounds normal; no masses,  no organomegaly Extremities: extremities normal, atraumatic, no cyanosis or edema  Lab Results:  Results for orders placed or performed during the hospital encounter of 12/23/18 (from the past 48 hour(s))  Basic metabolic panel     Status: Abnormal   Collection Time: 12/25/18  5:43 AM  Result Value Ref Range   Sodium 123 (L) 135 - 145 mmol/L   Potassium 4.1 3.5 - 5.1 mmol/L   Chloride 87 (L) 98 - 111 mmol/L   CO2 26 22 - 32 mmol/L   Glucose, Bld 98 70 - 99 mg/dL   BUN 14 6 - 20 mg/dL   Creatinine, Ser 1.61 0.44 - 1.00 mg/dL   Calcium 8.3 (L) 8.9 - 10.3 mg/dL   GFR calc non Af Amer >60 >60 mL/min   GFR calc Af Amer >60 >60 mL/min   Anion gap 10 5 - 15    Comment: Performed at [facility], [address] [place], [place] [address]  CBC     Status: Abnormal   Collection Time: 12/25/18  5:43 AM  Result Value Ref Range   WBC 9.1 4.0 - 10.5 K/uL   RBC 4.72 3.87 - 5.11 MIL/uL   Hemoglobin 13.7 12.0 - 15.0 g/dL   HCT 54.0 98.1 - 19.1 %   MCV 89.4 80.0 - 100.0 fL   MCH 29.0 26.0 - 34.0 pg   MCHC 32.5 30.0 - 36.0 g/dL   RDW 47.8 29.5 - 62.1 %   Platelets 20 (LL) 150 - 400 K/uL    Comment: REPEATED  TO VERIFY PLATELET COUNT CONFIRMED BY SMEAR SPECIMEN CHECKED FOR CLOTS THIS CRITICAL RESULT HAS VERIFIED AND BEEN CALLED TO GRAVES K BY LATISHA HENDERSON ON 12 27 2019 AT 0656, AND HAS BEEN READ BACK. CRITICAL RESULT VERIFIED    nRBC 0.0 0.0 - 0.2 %    Comment: Performed at [facility], [address], [place], [place] [address]  Protime-INR     Status: Abnormal   Collection Time: 12/25/18  5:43 AM  Result Value Ref Range   Prothrombin Time 18.2 (H) 11.4 - 15.2 seconds   INR 1.52     Comment: Performed at [facility], [address], [place], [place] [address]  Prepare Pheresed Platelets     Status: None   Collection Time: 12/25/18  1:27 PM  Result Value Ref Range   Unit Number [id number]    Blood Component Type PLTP LR1 PAS    Unit division 00    Status of Unit ISSUED,FINAL    Transfusion Status      OK TO TRANSFUSE Performed at [facility], [address], [place], [place] [address]   APTT     Status:  Abnormal   Collection Time: 12/25/18  1:27 PM  Result Value Ref Range   aPTT 60 (H) 24 - 36 seconds    Comment:        IF BASELINE aPTT IS ELEVATED, SUGGEST PATIENT RISK ASSESSMENT BE USED TO DETERMINE APPROPRIATE ANTICOAGULANT THERAPY. Performed at [facility], [address], [place], [place] [address]   Lactate dehydrogenase     Status: None   Collection Time: 12/25/18  1:27 PM  Result Value Ref Range   LDH 156 98 - 192 U/L    Comment: Performed at [facility], [address] [place], [place] [address]  Comprehensive metabolic panel     Status: Abnormal   Collection Time: 12/25/18  1:27 PM  Result Value Ref Range   Sodium 123 (L) 135 - 145 mmol/L   Potassium 3.6 3.5 - 5.1 mmol/L   Chloride 88 (L) 98 - 111 mmol/L   CO2 26 22 - 32 mmol/L   Glucose, Bld 134 (H) 70 - 99 mg/dL   BUN 15 6 - 20 mg/dL   Creatinine, Ser 2.63 0.44 - 1.00 mg/dL   Calcium 8.1 (L) 8.9 - 10.3 mg/dL   Total Protein 6.8 6.5 - 8.1 g/dL   Albumin 3.2 (L) 3.5 - 5.0 g/dL   AST 37 15 - 41 U/L    ALT 29 0 - 44 U/L   Alkaline Phosphatase 43 38 - 126 U/L   Total Bilirubin 0.4 0.3 - 1.2 mg/dL   GFR calc non Af Amer >60 >60 mL/min   GFR calc Af Amer >60 >60 mL/min   Anion gap 9 5 - 15    Comment: Performed at [facility], [address], [place], [place] [address]  ABO/Rh     Status: None   Collection Time: 12/25/18  1:27 PM  Result Value Ref Range   ABO/RH(D)      A POS Performed at [facility], [address], [place], [place] [address]   Protime-INR     Status: Abnormal   Collection Time: 12/26/18  6:55 AM  Result Value Ref Range   Prothrombin Time 18.7 (H) 11.4 - 15.2 seconds   INR 1.58     Comment: Performed at [facility], [address] [place], [place] [address]  CBC with Differential/Platelet     Status: Abnormal   Collection Time: 12/26/18  6:55 AM  Result Value Ref Range   WBC 4.5 4.0 - 10.5 K/uL   RBC 4.31 3.87 - 5.11 MIL/uL   Hemoglobin 12.5 12.0 - 15.0 g/dL   HCT 42.8 76.8 - 11.5 %   MCV 90.0 80.0 - 100.0 fL   MCH 29.0 26.0 - 34.0 pg   MCHC 32.2 30.0 - 36.0 g/dL   RDW 72.6 20.3 - 55.9 %   Platelets 26 (LL) 150 - 400 K/uL    Comment: Immature Platelet Fraction may be clinically indicated, consider ordering this additional test [id number] THIS CRITICAL RESULT HAS VERIFIED AND BEEN CALLED TO MOTLEY,J BY BOBBIE MATTHEWS ON 12 28 2019 AT 0819, AND HAS BEEN READ BACK.     nRBC 0.0 0.0 - 0.2 %   Neutrophils Relative % 51 %   Neutro Abs 2.3 1.7 - 7.7 K/uL   Lymphocytes Relative 30 %   Lymphs Abs 1.3 0.7 - 4.0 K/uL   Monocytes Relative 12 %   Monocytes Absolute 0.5 0.1 - 1.0 K/uL   Eosinophils Relative 6 %   Eosinophils Absolute 0.3 0.0 - 0.5 K/uL   Basophils  Relative 1 %   Basophils Absolute 0.0 0.0 - 0.1 K/uL   Immature Granulocytes 0 %   Abs Immature Granulocytes 0.01 0.00 - 0.07 K/uL    Comment: Performed at [facility], [address] [place], [place] [address]    ABGS No results for input(s): PHART, PO2ART, TCO2, HCO3 in the last 72  hours.  Invalid input(s): PCO2 CULTURES Recent Results (from the past 240 hour(s))  Culture, blood (routine x 2)     Status: None (Preliminary result)   Collection Time: 12/23/18 11:35 PM  Result Value Ref Range Status   Specimen Description BLOOD RIGHT ANTECUBITAL  Final   Special Requests   Final    BOTTLES DRAWN AEROBIC AND ANAEROBIC Blood Culture adequate volume   Culture   Final    NO GROWTH 3 DAYS Performed at [facility], [address] [place], [place] [address]    Report Status PENDING  Incomplete  Culture, blood (routine x 2)     Status: None (Preliminary result)   Collection Time: 12/23/18 11:50 PM  Result Value Ref Range Status   Specimen Description BLOOD LEFT ANTECUBITAL  Final   Special Requests   Final    BOTTLES DRAWN AEROBIC AND ANAEROBIC Blood Culture adequate volume   Culture   Final    NO GROWTH 3 DAYS Performed at [facility], [address] [place], [place] [address]    Report Status PENDING  Incomplete   Studies/Results: No results found.  Medications:  Prior to Admission:  Medications Prior to Admission  Medication Sig Dispense Refill Last Dose  . aspirin EC 81 MG tablet Take 81 mg by mouth daily.   12/23/2018 at 800  . aspirin-acetaminophen-caffeine (EXCEDRIN MIGRAINE) 250-250-65 MG tablet Take 2 tablets by mouth once as needed for headache or migraine.   Taking  . carvedilol (COREG) 12.5 MG tablet TAKE 1 TABLET BY MOUTH TWICE DAILY WITH MEALS 180 tablet 3 12/23/2018 at 2100  . clonazePAM (KLONOPIN) 1 MG tablet Take 1 mg by mouth 4 (four) times daily as needed for anxiety.   12/23/2018 at Unknown time  . DULoxetine (CYMBALTA) 60 MG capsule Take 60 mg by mouth every morning.   12/23/2018 at Unknown time  . furosemide (LASIX) 40 MG tablet TAKE 1 TABLET BY MOUTH DAILY AS NEEDED FOR SWELLING. 30 tablet 6   . furosemide (LASIX) 80 MG tablet TAKE 1 TABLET BY MOUTH DAILY 90 tablet 1 12/23/2018 at Unknown time  . gabapentin (NEURONTIN) 300 MG capsule Take  300 mg by mouth 2 (two) times daily.   12/23/2018 at Unknown time  . HYDROcodone-acetaminophen (NORCO) 10-325 MG per tablet Take 0.5 tablets by mouth 4 (four) times daily as needed for moderate pain or severe pain. pain   12/23/2018 at Unknown time  . isosorbide mononitrate (IMDUR) 30 MG 24 hr tablet TAKE 1 TABLET BY MOUTH DAILY 90 tablet 1 12/23/2018 at Unknown time  . losartan (COZAAR) 25 MG tablet TAKE 1/2 TABLET BY MOUTH EVERY DAY 45 tablet 1 12/23/2018 at Unknown time  . Multiple Vitamins-Minerals (MULTIVITAMINS THER. W/MINERALS) TABS Take 1 tablet by mouth daily.     12/23/2018 at Unknown time  . nicotine (NICODERM CQ - DOSED IN MG/24 HR) 7 mg/24hr patch Place 1 patch (7 mg total) onto the skin daily. 28 patch 0 Taking  . nitroGLYCERIN (NITROSTAT) 0.4 MG SL tablet PLACE 1 TABLET UNDER THE TONGUE EVERY 5 MINUTES FOR 3 DOSES AS NEEDED CHEST PAIN 25 tablet 3 Taking  . potassium  chloride SA (K-DUR,KLOR-CON) 20 MEQ tablet TAKE 1 TABLET BY MOUTH EVERY DAY 90 tablet 3 12/23/2018 at Unknown time  . promethazine (PHENERGAN) 25 MG tablet Take 25 mg by mouth every 6 (six) hours as needed for nausea.   Taking  . ranitidine (ZANTAC) 150 MG tablet Take 150 mg by mouth 2 (two) times daily.   12/23/2018 at Unknown time  . spironolactone (ALDACTONE) 25 MG tablet TAKE 1 TABLET BY MOUTH EVERY DAY 90 tablet 1 12/23/2018 at Unknown time  . warfarin (COUMADIN) 10 MG tablet Take 1 tablet daily except 1/2 tablet on Thursdays (Patient taking differently: Take 5-10 mg by mouth See admin instructions. TAKES 5MG  ON TUESDAYS AND THURSDAYS AND TAKES 10MG  ON ALL OTHER DAYS) 30 tablet 3 12/23/2018 at 800   Scheduled: . sodium chloride   Intravenous Once  . carvedilol  12.5 mg Oral BID WC  . DULoxetine  60 mg Oral q morning - 10a  . furosemide  40 mg Intravenous Q12H  . gabapentin  300 mg Oral BID  . guaiFENesin  1,200 mg Oral BID  . isosorbide mononitrate  30 mg Oral Daily  . losartan  12.5 mg Oral Daily  . multivitamin  with minerals  1 tablet Oral Daily  . oseltamivir  75 mg Oral BID  . potassium chloride  20 mEq Oral BID  . sodium chloride flush  3 mL Intravenous Q12H  . spironolactone  25 mg Oral Daily  . warfarin  5 mg Oral Once  . Warfarin - Pharmacist Dosing Inpatient   Does not apply q1800   Continuous:  WUJ:[id number] **OR** acetaminophen, clonazePAM, HYDROcodone-acetaminophen, loperamide, nitroGLYCERIN, ondansetron **OR** ondansetron (ZOFRAN) IV, polyethylene glycol  Assesment: She was admitted with influenza A.  She has been treated and is doing much better.  She has multiple other medical problems including ischemic cardiomyopathy with chronic combined systolic and diastolic heart failure.  She has been treated for that and has improved.  She is hyponatremic presumably from the influenza and from her diuretics and she is asymptomatic from that.  She has low platelets suspected to be related to her influenza.  She is not having any bleeding.  She is much improved clinically and I think she can go home with close follow-up from home health Active Problems:   Essential hypertension, benign   Cardiomyopathy, ischemic   CEREBROVASCULAR DISEASE   Peripheral vascular disease (HCC)   Anticardiolipin syndrome (HCC)   Long term current use of anticoagulant   Dual implantable cardioverter-defibrillator in situ   Chronic combined systolic (congestive) and diastolic (congestive) heart failure (HCC)   Hyponatremia   H. influenzae infection   Influenza A    Plan: Discharge home today    LOS: 2 days   [name] [name] 12/26/2018, 10:26 AM

## 2018-12-26 NOTE — Discharge Summary (Signed)
Physician Discharge Summary  Patient ID: [name] [name] MRN: [id number] DOB/AGE: December 05, 1961 57 y.o. Primary Care Physician:Allissa Albright, Ramon Dredge, MD Admit date: 12/23/2018 Discharge date: 12/26/2018    Discharge Diagnoses:   Active Problems:   Essential hypertension, benign   Cardiomyopathy, ischemic   CEREBROVASCULAR DISEASE   Peripheral vascular disease (HCC)   Anticardiolipin syndrome (HCC)   Long term current use of anticoagulant   Dual implantable cardioverter-defibrillator in situ   Chronic combined systolic (congestive) and diastolic (congestive) heart failure (HCC)   Hyponatremia   H. influenzae infection   Influenza A Thrombocytopenia   Allergies as of 12/26/2018      Reactions   Metoclopramide Hcl Anaphylaxis   Non responsive   Penicillins Anaphylaxis   Shock Has patient had a PCN reaction causing immediate rash, facial/tongue/throat swelling, SOB or lightheadedness with hypotension: Yes Has patient had a PCN reaction causing severe rash involving mucus membranes or skin necrosis: No Has patient had a PCN reaction that required hospitalization Yes Has patient had a PCN reaction occurring within the last 10 years: No If all of the above answers are "NO", then may proceed with Cephalosporin use.   Metoprolol Other (See Comments)   Syncope   Statins Other (See Comments)   Myalgias      Medication List    STOP taking these medications   aspirin EC 81 MG tablet   aspirin-acetaminophen-caffeine 250-250-65 MG tablet Commonly known as:  EXCEDRIN MIGRAINE     TAKE these medications   carvedilol 12.5 MG tablet Commonly known as:  COREG TAKE 1 TABLET BY MOUTH TWICE DAILY WITH MEALS   clonazePAM 1 MG tablet Commonly known as:  KLONOPIN Take 1 mg by mouth 4 (four) times daily as needed for anxiety.   doxycycline 100 MG capsule Commonly known as:  VIBRAMYCIN Take 1 capsule (100 mg total) by mouth 2 (two) times daily.   DULoxetine 60 MG capsule Commonly known  as:  CYMBALTA Take 60 mg by mouth every morning.   furosemide 80 MG tablet Commonly known as:  LASIX TAKE 1 TABLET BY MOUTH DAILY   furosemide 40 MG tablet Commonly known as:  LASIX TAKE 1 TABLET BY MOUTH DAILY AS NEEDED FOR SWELLING.   gabapentin 300 MG capsule Commonly known as:  NEURONTIN Take 300 mg by mouth 2 (two) times daily.   HYDROcodone-acetaminophen 10-325 MG tablet Commonly known as:  NORCO Take 0.5 tablets by mouth 4 (four) times daily as needed for moderate pain or severe pain. pain   isosorbide mononitrate 30 MG 24 hr tablet Commonly known as:  IMDUR TAKE 1 TABLET BY MOUTH DAILY   losartan 25 MG tablet Commonly known as:  COZAAR TAKE 1/2 TABLET BY MOUTH EVERY DAY   multivitamins ther. w/minerals Tabs tablet Take 1 tablet by mouth daily.   nicotine 7 mg/24hr patch Commonly known as:  NICODERM CQ - dosed in mg/24 hr Place 1 patch (7 mg total) onto the skin daily.   nitroGLYCERIN 0.4 MG SL tablet Commonly known as:  NITROSTAT PLACE 1 TABLET UNDER THE TONGUE EVERY 5 MINUTES FOR 3 DOSES AS NEEDED CHEST PAIN   oseltamivir 75 MG capsule Commonly known as:  TAMIFLU Take 1 capsule (75 mg total) by mouth 2 (two) times daily.   potassium chloride SA 20 MEQ tablet Commonly known as:  K-DUR,KLOR-CON TAKE 1 TABLET BY MOUTH EVERY DAY   promethazine 25 MG tablet Commonly known as:  PHENERGAN Take 25 mg by mouth every 6 (six) hours as needed for  nausea.   ranitidine 150 MG tablet Commonly known as:  ZANTAC Take 150 mg by mouth 2 (two) times daily.   spironolactone 25 MG tablet Commonly known as:  ALDACTONE TAKE 1 TABLET BY MOUTH EVERY DAY   warfarin 10 MG tablet Commonly known as:  COUMADIN Take as directed. If you are unsure how to take this medication, talk to your nurse or doctor. Original instructions:  Take 1 tablet daily except 1/2 tablet on Thursdays What changed:    how much to take  how to take this  when to take this  additional  instructions       Discharged Condition: Improved    Consults: Oncology/hematology  Significant Diagnostic Studies: Dg Chest 2 View  Result Date: 12/24/2018 CLINICAL DATA:  Acute onset of cough, shortness of breath and chills. Chest tightness. EXAM: CHEST - 2 VIEW COMPARISON:  Chest radiograph performed 01/14/2018 FINDINGS: The lungs are well-aerated. Vascular congestion is noted. Increased interstitial markings raise concern for mild interstitial edema, though pneumonia could have a similar appearance. Peribronchial thickening is noted. There is no evidence of pleural effusion or pneumothorax. The heart is mildly enlarged. A pacemaker/AICD is noted at the left chest wall, with leads ending at the right atrium and right ventricle. Lumbar spinal fusion hardware is partially imaged. No acute osseous abnormalities are seen. IMPRESSION: Vascular congestion and mild cardiomegaly. Increased interstitial markings raise concern for mild interstitial edema, though pneumonia could have a similar appearance. Peribronchial thickening noted. Electronically Signed   By: Roanna RaiderJeffery  Chang M.D.   On: 12/24/2018 00:16    Lab Results: Basic Metabolic Panel: Recent Labs    12/25/18 0543 12/25/18 1327  NA 123* 123*  K 4.1 3.6  CL 87* 88*  CO2 26 26  GLUCOSE 98 134*  BUN 14 15  CREATININE 0.70 0.72  CALCIUM 8.3* 8.1*   Liver Function Tests: Recent Labs    12/23/18 2335 12/25/18 1327  AST 34 37  ALT 28 29  ALKPHOS 58 43  BILITOT 0.5 0.4  PROT 8.3* 6.8  ALBUMIN 3.9 3.2*     CBC: Recent Labs    12/23/18 2335 12/25/18 0543 12/26/18 0655  WBC 6.3 9.1 4.5  NEUTROABS 4.9  --  2.3  HGB 14.3 13.7 12.5  HCT 44.4 42.2 38.8  MCV 88.6 89.4 90.0  PLT 50* 20* 26*    Recent Results (from the past 240 hour(s))  Culture, blood (routine x 2)     Status: None (Preliminary result)   Collection Time: 12/23/18 11:35 PM  Result Value Ref Range Status   Specimen Description BLOOD RIGHT ANTECUBITAL   Final   Special Requests   Final    BOTTLES DRAWN AEROBIC AND ANAEROBIC Blood Culture adequate volume   Culture   Final    NO GROWTH 3 DAYS Performed at [facility], [address] [place], [place] [address]    Report Status PENDING  Incomplete  Culture, blood (routine x 2)     Status: None (Preliminary result)   Collection Time: 12/23/18 11:50 PM  Result Value Ref Range Status   Specimen Description BLOOD LEFT ANTECUBITAL  Final   Special Requests   Final    BOTTLES DRAWN AEROBIC AND ANAEROBIC Blood Culture adequate volume   Culture   Final    NO GROWTH 3 DAYS Performed at [facility], [address] [place], [place] [address]    Report Status PENDING  Incomplete     Hospital Course: This is a 57 year old who has  multiple medical problems as listed above and who came to the emergency department with increasing problems with cough and congestion aching all over and fever.  She was found to have influenza A and was started on Tamiflu.  She also had what appeared to be some acute on chronic heart failure which was treated.  She had low platelets felt to be related to acute illness and hyponatremia multifactorial.  She developed low platelets but her platelet count is come up some by the time of discharge.  Her sodium level has remained low but she is totally asymptomatic.  By the time of discharge she is back at her baseline generally poor health but feels much better and wants to go home.  We discussed the risks of home with low platelet count but since we do not have any effective treatment for that other than platelet transfusion and she is not having any bleeding she wants to go ahead and be discharged.  She will restrict fluids as an outpatient Discharge Exam: Blood pressure (!) 72/53, pulse 69, temperature 98.4 F (36.9 C), temperature source Oral, resp. rate 18, height 5' 6.5" (1.689 m), weight 101.6 kg, SpO2 94 %. She is awake and alert.  Looks back to baseline.  Chest still shows  some rhonchi.  Heart is regular.  The blood pressure listed was during sleep and has been better  Disposition: Home with home health services.  She will have basic metabolic profile and CBC done on 12/28/2018.  Finish Tamiflu.  Discharge Instructions    Face-to-face encounter (required for Medicare/Medicaid patients)   Complete by:  As directed    I Fredirick Maudlin certify that this patient is under my care and that I, or a nurse practitioner or physician's assistant working with me, had a face-to-face encounter that meets the physician face-to-face encounter requirements with this patient on 12/26/2018. The encounter with the patient was in whole, or in part for the following medical condition(s) which is the primary reason for home health care (List medical condition): Influenza/thrombocytopenia/hyponatremia   The encounter with the patient was in whole, or in part, for the following medical condition, which is the primary reason for home health care:  Influenza/thrombocytopenia/hyponatremia   I certify that, based on my findings, the following services are medically necessary home health services:  Nursing   Reason for Medically Necessary Home Health Services:  Skilled Nursing- Change/Decline in Patient Status   My clinical findings support the need for the above services:  Shortness of breath with activity   Further, I certify that my clinical findings support that this patient is homebound due to:  Shortness of Breath with activity   Home Health   Complete by:  As directed    To provide the following care/treatments:  RN   Please check CBC and basic metabolic profile on 12/28/2018        Signed: Fredirick Maudlin   12/26/2018, 10:35 AM

## 2018-12-26 NOTE — Progress Notes (Signed)
ANTICOAGULATION CONSULT NOTE -  Pharmacy Consult for warfarin Indication: atrial fibrillation  Allergies  Allergen Reactions  . Metoclopramide Hcl Anaphylaxis    Non responsive  . Penicillins Anaphylaxis    Shock Has patient had a PCN reaction causing immediate rash, facial/tongue/throat swelling, SOB or lightheadedness with hypotension: Yes Has patient had a PCN reaction causing severe rash involving mucus membranes or skin necrosis: No Has patient had a PCN reaction that required hospitalization Yes Has patient had a PCN reaction occurring within the last 10 years: No If all of the above answers are "NO", then may proceed with Cephalosporin use.   . Metoprolol Other (See Comments)    Syncope   . Statins Other (See Comments)    Myalgias    Patient Measurements: Height: 5' 6.5" (168.9 cm) Weight: 223 lb 15.8 oz (101.6 kg) IBW/kg (Calculated) : 60.45   Vital Signs: Temp: 98.4 F (36.9 C) (12/28 0522) Temp Source: Oral (12/28 0522) BP: 72/53 (12/28 0522) Pulse Rate: 69 (12/28 0522)  Labs: Recent Labs    12/23/18 2335 12/25/18 0543 12/25/18 1327 12/26/18 0655  HGB 14.3 13.7  --   --   HCT 44.4 42.2  --   --   PLT 50* 20*  --   --   APTT  --   --  60*  --   LABPROT 18.7* 18.2*  --  18.7*  INR 1.58 1.52  --  1.58  CREATININE 0.76 0.70 0.72  --   TROPONINI <0.03  --   --   --     Estimated Creatinine Clearance: 96.5 mL/min (by C-G formula based on SCr of 0.72 mg/dL).   Medical History: Past Medical History:  Diagnosis Date  . Aneurysm (HCC)    internal carotid artery  . Anticardiolipin antibody positive    Chronic Coumadin  . Chronic combined systolic (congestive) and diastolic (congestive) heart failure (HCC)   . Cocaine abuse in remission (HCC)   . Coronary atherosclerosis of native coronary artery    [place] cardiac [place]  Occluded diagonal, Diffuse LAD disease, Occluded Cx, and non obstructive  RCA.   . Essential hypertension, benign   . Headache   . History of pneumonia   . Ischemic cardiomyopathy    LVEF 30-35%  . Mixed hyperlipidemia   . PVD (peripheral vascular disease) (HCC)   . Raynaud's phenomenon   . Stroke [facility](HCC) 2007   Total occlusion of the right internal carotid    Medications:  Medications Prior to Admission  Medication Sig Dispense Refill Last Dose  . aspirin EC 81 MG tablet Take 81 mg by mouth daily.   12/23/2018 at 800  . aspirin-acetaminophen-caffeine (EXCEDRIN MIGRAINE) 250-250-65 MG tablet Take 2 tablets by mouth once as needed for headache or migraine.   Taking  . carvedilol (COREG) 12.5 MG tablet TAKE 1 TABLET BY MOUTH TWICE DAILY WITH MEALS 180 tablet 3 12/23/2018 at 2100  . clonazePAM (KLONOPIN) 1 MG tablet Take 1 mg by mouth 4 (four) times daily as needed for anxiety.   12/23/2018 at Unknown time  . DULoxetine (CYMBALTA) 60 MG capsule Take 60 mg by mouth every morning.   12/23/2018 at Unknown time  . furosemide (LASIX) 40 MG tablet TAKE 1 TABLET BY MOUTH DAILY AS NEEDED FOR SWELLING. 30 tablet 6   . furosemide (LASIX) 80 MG tablet TAKE 1 TABLET BY MOUTH DAILY 90 tablet 1 12/23/2018 at Unknown time  . gabapentin (NEURONTIN) 300  MG capsule Take 300 mg by mouth 2 (two) times daily.   12/23/2018 at Unknown time  . HYDROcodone-acetaminophen (NORCO) 10-325 MG per tablet Take 0.5 tablets by mouth 4 (four) times daily as needed for moderate pain or severe pain. pain   12/23/2018 at Unknown time  . isosorbide mononitrate (IMDUR) 30 MG 24 hr tablet TAKE 1 TABLET BY MOUTH DAILY 90 tablet 1 12/23/2018 at Unknown time  . losartan (COZAAR) 25 MG tablet TAKE 1/2 TABLET BY MOUTH EVERY DAY 45 tablet 1 12/23/2018 at Unknown time  . Multiple Vitamins-Minerals (MULTIVITAMINS THER. W/MINERALS) TABS Take 1 tablet by mouth daily.     12/23/2018 at Unknown time  . nicotine (NICODERM CQ - DOSED IN MG/24 HR) 7 mg/24hr patch Place 1 patch (7 mg total) onto the skin daily. 28 patch 0  Taking  . nitroGLYCERIN (NITROSTAT) 0.4 MG SL tablet PLACE 1 TABLET UNDER THE TONGUE EVERY 5 MINUTES FOR 3 DOSES AS NEEDED CHEST PAIN 25 tablet 3 Taking  . potassium chloride SA (K-DUR,KLOR-CON) 20 MEQ tablet TAKE 1 TABLET BY MOUTH EVERY DAY 90 tablet 3 12/23/2018 at Unknown time  . promethazine (PHENERGAN) 25 MG tablet Take 25 mg by mouth every 6 (six) hours as needed for nausea.   Taking  . ranitidine (ZANTAC) 150 MG tablet Take 150 mg by mouth 2 (two) times daily.   12/23/2018 at Unknown time  . spironolactone (ALDACTONE) 25 MG tablet TAKE 1 TABLET BY MOUTH EVERY DAY 90 tablet 1 12/23/2018 at Unknown time  . warfarin (COUMADIN) 10 MG tablet Take 1 tablet daily except 1/2 tablet on Thursdays (Patient taking differently: Take 5-10 mg by mouth See admin instructions. TAKES 5MG  ON TUESDAYS AND THURSDAYS AND TAKES 10MG  ON ALL OTHER DAYS) 30 tablet 3 12/23/2018 at 800    Assessment: Pharmacy consulted to dose warfarin for patient with atrial fibrillation.  Patient's INR is subtherapeutic at 1.52.  Home dose listed as 5 mg MWF and 10 mg ROW.  Platelets decreased from 50k to [facility] with hematology consult requested.    Goal of Therapy:  INR 2-3 Monitor platelets by anticoagulation protocol: Yes   Plan:  Warfarin 5 mg x 1 dose. Monitor daily INR and s/s of bleeding due to thrombocytopenia.  Luan Pulling, PharmD, MBA, BCGP Clinical Pharmacist  12/26/2018 8:19 AM

## 2018-12-27 ENCOUNTER — Emergency Department (HOSPITAL_COMMUNITY)
Admission: EM | Admit: 2018-12-27 | Discharge: 2018-12-27 | Disposition: A | Discharge status: Home / Self Care | Payer: Medicare PPO | Attending: Emergency Medicine | Admitting: Emergency Medicine

## 2018-12-27 ENCOUNTER — Encounter (HOSPITAL_COMMUNITY): Payer: Self-pay | Admitting: Emergency Medicine

## 2018-12-27 ENCOUNTER — Other Ambulatory Visit: Payer: Self-pay

## 2018-12-27 DIAGNOSIS — Z5321 Procedure and treatment not carried out due to patient leaving prior to being seen by health care provider: Secondary | ICD-10-CM | POA: Diagnosis not present

## 2018-12-27 DIAGNOSIS — R531 Weakness: Secondary | ICD-10-CM | POA: Insufficient documentation

## 2018-12-27 NOTE — ED Triage Notes (Signed)
PT STATES THAT SHE WEAK AND SHE IS COUGHING UP A PINK. SHE DOES HAVE THE FLU.

## 2018-12-28 ENCOUNTER — Other Ambulatory Visit (HOSPITAL_COMMUNITY): Payer: Self-pay | Admitting: Nurse Practitioner

## 2018-12-28 ENCOUNTER — Other Ambulatory Visit (HOSPITAL_COMMUNITY)
Admission: RE | Admit: 2018-12-28 | Discharge: 2018-12-28 | Disposition: A | Discharge status: Home / Self Care | Payer: Medicare PPO | Source: Ambulatory Visit | Attending: Pulmonary Disease | Admitting: Pulmonary Disease

## 2018-12-28 DIAGNOSIS — J111 Influenza due to unidentified influenza virus with other respiratory manifestations: Secondary | ICD-10-CM | POA: Diagnosis not present

## 2018-12-28 DIAGNOSIS — J09X9 Influenza due to identified novel influenza A virus with other manifestations: Secondary | ICD-10-CM | POA: Insufficient documentation

## 2018-12-28 DIAGNOSIS — I5023 Acute on chronic systolic (congestive) heart failure: Secondary | ICD-10-CM | POA: Diagnosis not present

## 2018-12-28 DIAGNOSIS — J09X1 Influenza due to identified novel influenza A virus with pneumonia: Secondary | ICD-10-CM | POA: Diagnosis not present

## 2018-12-28 LAB — CULTURE, BLOOD (ROUTINE X 2)
Culture: NO GROWTH
Culture: NO GROWTH
Special Requests: ADEQUATE
Special Requests: ADEQUATE

## 2018-12-28 LAB — CBC
HCT: 38.5 % (ref 36.0–46.0)
Hemoglobin: 12.4 g/dL (ref 12.0–15.0)
MCH: 28.6 pg (ref 26.0–34.0)
MCHC: 32.2 g/dL (ref 30.0–36.0)
MCV: 88.9 fL (ref 80.0–100.0)
Platelets: 57 10*3/uL — ABNORMAL LOW (ref 150–400)
RBC: 4.33 MIL/uL (ref 3.87–5.11)
RDW: 12.8 % (ref 11.5–15.5)
WBC: 5.5 10*3/uL (ref 4.0–10.5)
nRBC: 0 % (ref 0.0–0.2)

## 2018-12-28 LAB — BASIC METABOLIC PANEL
Anion gap: 11 (ref 5–15)
BUN: 11 mg/dL (ref 6–20)
CO2: 24 mmol/L (ref 22–32)
Calcium: 8.5 mg/dL — ABNORMAL LOW (ref 8.9–10.3)
Chloride: 100 mmol/L (ref 98–111)
Creatinine, Ser: 0.68 mg/dL (ref 0.44–1.00)
GFR calc Af Amer: >60 mL/min (ref >60)
GFR calc non Af Amer: >60 mL/min (ref >60)
Glucose, Bld: 138 mg/dL — ABNORMAL HIGH (ref 70–99)
Potassium: 4 mmol/L (ref 3.5–5.1)
Sodium: 135 mmol/L (ref 135–145)

## 2018-12-28 LAB — PROTIME-INR
INR: 1.9
Prothrombin Time: 21.5 seconds — ABNORMAL HIGH (ref 11.4–15.2)

## 2018-12-29 DIAGNOSIS — J09X1 Influenza due to identified novel influenza A virus with pneumonia: Secondary | ICD-10-CM | POA: Diagnosis not present

## 2018-12-29 DIAGNOSIS — I25119 Atherosclerotic heart disease of native coronary artery with unspecified angina pectoris: Secondary | ICD-10-CM | POA: Diagnosis not present

## 2018-12-29 DIAGNOSIS — M545 Low back pain: Secondary | ICD-10-CM | POA: Diagnosis not present

## 2018-12-29 DIAGNOSIS — I6789 Other cerebrovascular disease: Secondary | ICD-10-CM | POA: Diagnosis not present

## 2018-12-31 ENCOUNTER — Emergency Department (HOSPITAL_COMMUNITY): Payer: Medicare Other

## 2018-12-31 ENCOUNTER — Encounter (HOSPITAL_COMMUNITY): Payer: Self-pay | Admitting: Emergency Medicine

## 2018-12-31 ENCOUNTER — Other Ambulatory Visit: Payer: Self-pay

## 2018-12-31 ENCOUNTER — Inpatient Hospital Stay (HOSPITAL_COMMUNITY)
Admission: EM | Admit: 2018-12-31 | Discharge: 2019-01-06 | DRG: 193 | Disposition: A | Discharge status: Home / Self Care | Payer: Medicare Other | Attending: Pulmonary Disease | Admitting: Pulmonary Disease

## 2018-12-31 DIAGNOSIS — Z8673 Personal history of transient ischemic attack (TIA), and cerebral infarction without residual deficits: Secondary | ICD-10-CM

## 2018-12-31 DIAGNOSIS — Y95 Nosocomial condition: Secondary | ICD-10-CM | POA: Diagnosis not present

## 2018-12-31 DIAGNOSIS — E782 Mixed hyperlipidemia: Secondary | ICD-10-CM | POA: Diagnosis not present

## 2018-12-31 DIAGNOSIS — I10 Essential (primary) hypertension: Secondary | ICD-10-CM | POA: Diagnosis present

## 2018-12-31 DIAGNOSIS — I255 Ischemic cardiomyopathy: Secondary | ICD-10-CM | POA: Diagnosis present

## 2018-12-31 DIAGNOSIS — D6861 Antiphospholipid syndrome: Secondary | ICD-10-CM | POA: Diagnosis not present

## 2018-12-31 DIAGNOSIS — Z8249 Family history of ischemic heart disease and other diseases of the circulatory system: Secondary | ICD-10-CM

## 2018-12-31 DIAGNOSIS — I679 Cerebrovascular disease, unspecified: Secondary | ICD-10-CM | POA: Diagnosis present

## 2018-12-31 DIAGNOSIS — R0602 Shortness of breath: Secondary | ICD-10-CM | POA: Diagnosis not present

## 2018-12-31 DIAGNOSIS — E785 Hyperlipidemia, unspecified: Secondary | ICD-10-CM | POA: Diagnosis present

## 2018-12-31 DIAGNOSIS — J111 Influenza due to unidentified influenza virus with other respiratory manifestations: Secondary | ICD-10-CM | POA: Diagnosis not present

## 2018-12-31 DIAGNOSIS — F1411 Cocaine abuse, in remission: Secondary | ICD-10-CM | POA: Diagnosis present

## 2018-12-31 DIAGNOSIS — J440 Chronic obstructive pulmonary disease with acute lower respiratory infection: Secondary | ICD-10-CM | POA: Diagnosis present

## 2018-12-31 DIAGNOSIS — J9601 Acute respiratory failure with hypoxia: Secondary | ICD-10-CM | POA: Diagnosis not present

## 2018-12-31 DIAGNOSIS — F419 Anxiety disorder, unspecified: Secondary | ICD-10-CM | POA: Diagnosis present

## 2018-12-31 DIAGNOSIS — B002 Herpesviral gingivostomatitis and pharyngotonsillitis: Secondary | ICD-10-CM | POA: Diagnosis not present

## 2018-12-31 DIAGNOSIS — I110 Hypertensive heart disease with heart failure: Secondary | ICD-10-CM | POA: Diagnosis present

## 2018-12-31 DIAGNOSIS — F17200 Nicotine dependence, unspecified, uncomplicated: Secondary | ICD-10-CM | POA: Diagnosis present

## 2018-12-31 DIAGNOSIS — I5042 Chronic combined systolic (congestive) and diastolic (congestive) heart failure: Secondary | ICD-10-CM | POA: Diagnosis present

## 2018-12-31 DIAGNOSIS — A419 Sepsis, unspecified organism: Secondary | ICD-10-CM | POA: Diagnosis not present

## 2018-12-31 DIAGNOSIS — R0902 Hypoxemia: Secondary | ICD-10-CM | POA: Diagnosis not present

## 2018-12-31 DIAGNOSIS — B379 Candidiasis, unspecified: Secondary | ICD-10-CM | POA: Diagnosis present

## 2018-12-31 DIAGNOSIS — J189 Pneumonia, unspecified organism: Secondary | ICD-10-CM | POA: Diagnosis not present

## 2018-12-31 DIAGNOSIS — E876 Hypokalemia: Secondary | ICD-10-CM | POA: Diagnosis not present

## 2018-12-31 DIAGNOSIS — Z981 Arthrodesis status: Secondary | ICD-10-CM

## 2018-12-31 DIAGNOSIS — Z79899 Other long term (current) drug therapy: Secondary | ICD-10-CM

## 2018-12-31 DIAGNOSIS — I2510 Atherosclerotic heart disease of native coronary artery without angina pectoris: Secondary | ICD-10-CM | POA: Diagnosis present

## 2018-12-31 DIAGNOSIS — D696 Thrombocytopenia, unspecified: Secondary | ICD-10-CM | POA: Diagnosis not present

## 2018-12-31 DIAGNOSIS — R0989 Other specified symptoms and signs involving the circulatory and respiratory systems: Secondary | ICD-10-CM | POA: Diagnosis not present

## 2018-12-31 DIAGNOSIS — I739 Peripheral vascular disease, unspecified: Secondary | ICD-10-CM | POA: Diagnosis present

## 2018-12-31 DIAGNOSIS — Z95810 Presence of automatic (implantable) cardiac defibrillator: Secondary | ICD-10-CM | POA: Diagnosis present

## 2018-12-31 DIAGNOSIS — Z7901 Long term (current) use of anticoagulants: Secondary | ICD-10-CM

## 2018-12-31 DIAGNOSIS — R6510 Systemic inflammatory response syndrome (SIRS) of non-infectious origin without acute organ dysfunction: Secondary | ICD-10-CM | POA: Diagnosis present

## 2018-12-31 DIAGNOSIS — E860 Dehydration: Secondary | ICD-10-CM | POA: Diagnosis present

## 2018-12-31 DIAGNOSIS — F17290 Nicotine dependence, other tobacco product, uncomplicated: Secondary | ICD-10-CM | POA: Diagnosis present

## 2018-12-31 LAB — CBC WITH DIFFERENTIAL/PLATELET
Abs Immature Granulocytes: 0.08 10*3/uL — ABNORMAL HIGH (ref 0.00–0.07)
Basophils Absolute: 0 10*3/uL (ref 0.0–0.1)
Basophils Relative: 0 %
Eosinophils Absolute: 0 10*3/uL (ref 0.0–0.5)
Eosinophils Relative: 0 %
HCT: 37.6 % (ref 36.0–46.0)
Hemoglobin: 11.9 g/dL — ABNORMAL LOW (ref 12.0–15.0)
Immature Granulocytes: 1 %
Lymphocytes Relative: 10 %
Lymphs Abs: 1.5 10*3/uL (ref 0.7–4.0)
MCH: 28.7 pg (ref 26.0–34.0)
MCHC: 31.6 g/dL (ref 30.0–36.0)
MCV: 90.6 fL (ref 80.0–100.0)
Monocytes Absolute: 1.5 10*3/uL — ABNORMAL HIGH (ref 0.1–1.0)
Monocytes Relative: 10 %
Neutro Abs: 11.3 10*3/uL — ABNORMAL HIGH (ref 1.7–7.7)
Neutrophils Relative %: 79 %
Platelets: 371 10*3/uL (ref 150–400)
RBC: 4.15 MIL/uL (ref 3.87–5.11)
RDW: 13 % (ref 11.5–15.5)
WBC: 14.4 10*3/uL — ABNORMAL HIGH (ref 4.0–10.5)
nRBC: 0 % (ref 0.0–0.2)

## 2018-12-31 LAB — URINALYSIS, ROUTINE W REFLEX MICROSCOPIC
Bilirubin Urine: NEGATIVE
Glucose, UA: NEGATIVE mg/dL
Hgb urine dipstick: NEGATIVE
Ketones, ur: NEGATIVE mg/dL
Leukocytes, UA: NEGATIVE
Nitrite: NEGATIVE
Protein, ur: NEGATIVE mg/dL
Specific Gravity, Urine: 1.005 (ref 1.005–1.030)
pH: 6 (ref 5.0–8.0)

## 2018-12-31 LAB — BASIC METABOLIC PANEL
Anion gap: 9 (ref 5–15)
BUN: 13 mg/dL (ref 6–20)
CO2: 31 mmol/L (ref 22–32)
Calcium: 8.7 mg/dL — ABNORMAL LOW (ref 8.9–10.3)
Chloride: 95 mmol/L — ABNORMAL LOW (ref 98–111)
Creatinine, Ser: 0.65 mg/dL (ref 0.44–1.00)
GFR calc Af Amer: >60 mL/min (ref >60)
GFR calc non Af Amer: >60 mL/min (ref >60)
Glucose, Bld: 82 mg/dL (ref 70–99)
Potassium: 3.2 mmol/L — ABNORMAL LOW (ref 3.5–5.1)
Sodium: 135 mmol/L (ref 135–145)

## 2018-12-31 LAB — PROTIME-INR
INR: 2.56
Prothrombin Time: 27.2 seconds — ABNORMAL HIGH (ref 11.4–15.2)

## 2018-12-31 LAB — BRAIN NATRIURETIC PEPTIDE: B Natriuretic Peptide: 523 pg/mL — ABNORMAL HIGH (ref 0.0–100.0)

## 2018-12-31 LAB — TROPONIN I
Troponin I: <0.03 ng/mL (ref <0.03)
Troponin I: <0.03 ng/mL (ref <0.03)

## 2018-12-31 LAB — I-STAT CG4 LACTIC ACID, ED
Lactic Acid, Venous: 1.32 mmol/L (ref 0.5–1.9)
Lactic Acid, Venous: 1.09 mmol/L (ref 0.5–1.9)

## 2018-12-31 MED ORDER — ONDANSETRON HCL 4 MG/2ML IJ SOLN
4.0000 mg | Freq: Four times a day (QID) | INTRAMUSCULAR | Status: DC | PRN
  Administered 2018-12-31 – 2019-01-05 (×11): 4 mg via INTRAVENOUS
  Filled 2018-12-31 (×12): qty 2

## 2018-12-31 MED ORDER — POTASSIUM CHLORIDE CRYS ER 20 MEQ PO TBCR
40.0000 meq | EXTENDED_RELEASE_TABLET | Freq: Once | ORAL | Status: AC
  Administered 2018-12-31: 40 meq via ORAL
  Filled 2018-12-31: qty 2

## 2018-12-31 MED ORDER — POTASSIUM CHLORIDE CRYS ER 20 MEQ PO TBCR
20.0000 meq | EXTENDED_RELEASE_TABLET | Freq: Every day | ORAL | Status: DC
  Administered 2019-01-01 – 2019-01-06 (×6): 20 meq via ORAL
  Filled 2018-12-31 (×3): qty 1
  Filled 2018-12-31: qty 2
  Filled 2018-12-31 (×2): qty 1

## 2018-12-31 MED ORDER — WARFARIN SODIUM 5 MG PO TABS: 10.0000 mg | ORAL_TABLET | ORAL | Status: DC

## 2018-12-31 MED ORDER — DULOXETINE HCL 60 MG PO CPEP
60.0000 mg | ORAL_CAPSULE | Freq: Every morning | ORAL | Status: DC
  Administered 2019-01-01 – 2019-01-06 (×6): 60 mg via ORAL
  Filled 2018-12-31 (×6): qty 1

## 2018-12-31 MED ORDER — VANCOMYCIN HCL IN DEXTROSE 1-5 GM/200ML-% IV SOLN
1000.0000 mg | Freq: Once | INTRAVENOUS | Status: AC
  Administered 2018-12-31: 1000 mg via INTRAVENOUS
  Filled 2018-12-31: qty 200

## 2018-12-31 MED ORDER — WARFARIN SODIUM 5 MG PO TABS: 5.0000 mg | ORAL_TABLET | ORAL | Status: DC

## 2018-12-31 MED ORDER — ONDANSETRON HCL 4 MG PO TABS
4.0000 mg | ORAL_TABLET | Freq: Four times a day (QID) | ORAL | Status: DC | PRN
  Administered 2019-01-05 – 2019-01-06 (×5): 4 mg via ORAL
  Filled 2018-12-31 (×5): qty 1

## 2018-12-31 MED ORDER — SODIUM CHLORIDE 0.9 % IV SOLN
2.0000 g | Freq: Three times a day (TID) | INTRAVENOUS | Status: DC
  Administered 2018-12-31 – 2019-01-01 (×2): 2 g via INTRAVENOUS
  Filled 2018-12-31 (×14): qty 2

## 2018-12-31 MED ORDER — SPIRONOLACTONE 25 MG PO TABS
25.0000 mg | ORAL_TABLET | Freq: Every day | ORAL | Status: DC
  Administered 2019-01-01 – 2019-01-06 (×6): 25 mg via ORAL
  Filled 2018-12-31 (×6): qty 1

## 2018-12-31 MED ORDER — ACETAMINOPHEN 325 MG PO TABS
650.0000 mg | ORAL_TABLET | Freq: Once | ORAL | Status: AC
  Administered 2018-12-31: 650 mg via ORAL
  Filled 2018-12-31: qty 2

## 2018-12-31 MED ORDER — SODIUM CHLORIDE 0.9 % IV SOLN
2.0000 g | Freq: Once | INTRAVENOUS | Status: AC
  Administered 2018-12-31: 2 g via INTRAVENOUS
  Filled 2018-12-31: qty 2

## 2018-12-31 MED ORDER — VANCOMYCIN HCL IN DEXTROSE 1-5 GM/200ML-% IV SOLN
1000.0000 mg | Freq: Two times a day (BID) | INTRAVENOUS | Status: DC
  Administered 2018-12-31 – 2019-01-05 (×10): 1000 mg via INTRAVENOUS
  Filled 2018-12-31 (×9): qty 200

## 2018-12-31 MED ORDER — WARFARIN - PHARMACIST DOSING INPATIENT
Freq: Every day | Status: DC
  Administered 2018-12-31 – 2019-01-02 (×3)

## 2018-12-31 MED ORDER — DM-GUAIFENESIN ER 30-600 MG PO TB12
1.0000 | ORAL_TABLET | Freq: Two times a day (BID) | ORAL | Status: DC
  Administered 2018-12-31 – 2019-01-01 (×3): 1 via ORAL
  Filled 2018-12-31 (×3): qty 1

## 2018-12-31 MED ORDER — IPRATROPIUM-ALBUTEROL 0.5-2.5 (3) MG/3ML IN SOLN
3.0000 mL | Freq: Three times a day (TID) | RESPIRATORY_TRACT | Status: DC | PRN
  Administered 2019-01-01: 3 mL via RESPIRATORY_TRACT
  Filled 2018-12-31: qty 3

## 2018-12-31 MED ORDER — CLONAZEPAM 0.5 MG PO TABS
0.2500 mg | ORAL_TABLET | Freq: Every evening | ORAL | Status: DC | PRN
  Administered 2018-12-31 – 2019-01-05 (×5): 0.25 mg via ORAL
  Filled 2018-12-31 (×5): qty 1

## 2018-12-31 MED ORDER — GABAPENTIN 300 MG PO CAPS
300.0000 mg | ORAL_CAPSULE | Freq: Two times a day (BID) | ORAL | Status: DC
  Administered 2018-12-31 – 2019-01-06 (×12): 300 mg via ORAL
  Filled 2018-12-31 (×12): qty 1

## 2018-12-31 MED ORDER — LOPERAMIDE HCL 2 MG PO CAPS
4.0000 mg | ORAL_CAPSULE | Freq: Once | ORAL | Status: AC
  Administered 2018-12-31: 4 mg via ORAL
  Filled 2018-12-31: qty 2

## 2018-12-31 MED ORDER — HYDROCODONE-ACETAMINOPHEN 10-325 MG PO TABS
0.5000 | ORAL_TABLET | Freq: Three times a day (TID) | ORAL | Status: DC | PRN
  Administered 2018-12-31 – 2019-01-02 (×3): 0.5 via ORAL
  Filled 2018-12-31 (×3): qty 1

## 2018-12-31 NOTE — Progress Notes (Addendum)
Pharmacy Note:  Initial antibiotic(s) regimen of Vancomycin / Aztreonam ordered by EDP to treat HCAP.  Estimated Creatinine Clearance: 95.6 mL/min (by C-G formula based on SCr of 0.68 mg/dL).   Allergies  Allergen Reactions  . Metoclopramide Hcl Anaphylaxis    Non responsive  . Penicillins Anaphylaxis    Shock Has patient had a PCN reaction causing immediate rash, facial/tongue/throat swelling, SOB or lightheadedness with hypotension: Yes Has patient had a PCN reaction causing severe rash involving mucus membranes or skin necrosis: No Has patient had a PCN reaction that required hospitalization Yes Has patient had a PCN reaction occurring within the last 10 years: No If all of the above answers are "NO", then may proceed with Cephalosporin use.   . Metoprolol Other (See Comments)    Syncope   . Statins Other (See Comments)    Myalgias    Vitals:   12/31/18 1122 12/31/18 1313  BP:    Pulse:    Resp:    Temp: 100.2 F (37.9 C) (!) 102.3 F (39.1 C)  SpO2: 92%     Anti-infectives (From admission, onward)   Start     Dose/Rate Route Frequency Ordered Stop   12/31/18 1330  vancomycin (VANCOCIN) IVPB 1000 mg/200 mL premix     1,000 mg 200 mL/hr over 60 Minutes Intravenous  Once 12/31/18 1315     12/31/18 1330  aztreonam (AZACTAM) 2 g in sodium chloride 0.9 % 100 mL IVPB     2 g 200 mL/hr over 30 Minutes Intravenous  Once 12/31/18 1315       Antimicrobials this admission:  Vanc 1/2 >>  Aztreonam 1/2 >>   Dose adjustments this admission:  n/a  Microbiology results:   BCx:   UCx:    Sputum:    MRSA PCR:    Plan: Continued on Admission. Vancomycin 1gm IV every 12 hours. Goal Trough 15-20 Aztreonam 2gm IV every 8 hours. Monitor labs, micro and vitals.   Mady Gemma, [facility] 12/31/2018 1:28 PM

## 2018-12-31 NOTE — Progress Notes (Addendum)
ANTICOAGULATION CONSULT NOTE - Follow Up Consult  Pharmacy Consult for warfarin Indication: Hypercoagulable state with anti-cardiolipin antibody  Allergies  Allergen Reactions  . Metoclopramide Hcl Anaphylaxis    Non responsive  . Penicillins Anaphylaxis    Shock Has patient had a PCN reaction causing immediate rash, facial/tongue/throat swelling, SOB or lightheadedness with hypotension: Yes Has patient had a PCN reaction causing severe rash involving mucus membranes or skin necrosis: No Has patient had a PCN reaction that required hospitalization Yes Has patient had a PCN reaction occurring within the last 10 years: No If all of the above answers are "NO", then may proceed with Cephalosporin use.   . Metoprolol Other (See Comments)    Syncope   . Statins Other (See Comments)    Myalgias    Patient Measurements: Height: 5\' 6"  (167.6 cm) Weight: 224 lb (101.6 kg) IBW/kg (Calculated) : 59.3  Vital Signs: Temp: 102.3 F (39.1 C) (01/02 1313) Temp Source: Rectal (01/02 1313) BP: 112/70 (01/02 1600) Pulse Rate: 74 (01/02 1600)  Labs: Recent Labs    12/31/18 1328  HGB 11.9*  HCT 37.6  PLT 371  LABPROT 27.2*  INR 2.56  CREATININE 0.65  TROPONINI <0.03    Estimated Creatinine Clearance: 95.6 mL/min (by C-G formula based on SCr of 0.65 mg/dL).   Medications:  (Not in a hospital admission)  Scheduled:  Infusions:  PRN:  Anti-infectives (From admission, onward)   Start     Dose/Rate Route Frequency Ordered Stop   12/31/18 1330  vancomycin (VANCOCIN) IVPB 1000 mg/200 mL premix     1,000 mg 200 mL/hr over 60 Minutes Intravenous  Once 12/31/18 1315 12/31/18 1504   12/31/18 1330  aztreonam (AZACTAM) 2 g in sodium chloride 0.9 % 100 mL IVPB     2 g 200 mL/hr over 30 Minutes Intravenous  Once 12/31/18 1315 12/31/18 1504      Assessment: 58 year old female on chronic anticoagulation with warfarin from home for the treatment of hypercoagulable disease with  anticardiolipin antibody positive. Patient has a therapeutic INR of 2.56 upon admission. Patient has had episodes of thrombocytopenia as recent as 11/26-11/29 with platelet count of 20. Today platelets are 371.   Warfarin home dose: Warfarin 5mg  po Tuesday and Thursdays, warfarin 10mg  po all other days. Patient last dose 12/31/18.  Goal of Therapy:  INR 2-3 Monitor platelets by anticoagulation protocol: Yes   Plan:  Hold warfarin tonight, since took this morning  Continue home dose of warfarin and monitor closely Warfarin 5mg  po Tuesday and Thursdays, warfarin 10mg  po all other days.  INR and CBC, continue to monitor for S&S of bleeding   Gerre Pebbles Fares Ramthun 12/31/2018,4:49 PM

## 2018-12-31 NOTE — ED Triage Notes (Signed)
Patient complains of congestion and shortness of breathe since this morning. Patient was D/C'd on 12/28 for the flu.

## 2018-12-31 NOTE — ED Provider Notes (Signed)
[facility] EMERGENCY DEPARTMENT Provider Note   CSN: [id number] Arrival date & time: 12/31/18  1055     History   Chief Complaint Chief Complaint  Patient presents with  . Shortness of Breath    HPI [name] [name] is a 58 y.o. female.  Complains of shortness of breath since 12/22/2018.  Patient was admitted here on 12/24/2018.  Diagnosis of influenza.  Received Tamiflu as well as doxycycline.  She reports that after discharge she continues to feel generally weak and short of breath.  She has cough productive of yellow sputum.  Sputum was dark-colored initially during this illness.  Dyspnea is worse with exertion and improved with rest.  Other associated symptoms include diarrhea 3 episodes this week.  Nonbloody.  HPI  Past Medical History:  Diagnosis Date  . Aneurysm (HCC)    internal carotid artery  . Anticardiolipin antibody positive    Chronic Coumadin  . Chronic combined systolic (congestive) and diastolic (congestive) heart failure (HCC)   . Cocaine abuse in remission (HCC)   . Coronary atherosclerosis of native coronary artery    Previous invasive cardiac testing was done at a facility in [place], [place].  Occluded diagonal, Diffuse LAD disease, Occluded Cx, and non obstructive RCA.   . Essential hypertension, benign   . Headache   . History of pneumonia   . Ischemic cardiomyopathy    LVEF 30-35%  . Mixed hyperlipidemia   . PVD (peripheral vascular disease) (HCC)   . Raynaud's phenomenon   . Stroke [facility]) 2007   Total occlusion of the right internal carotid    Patient Active Problem List   Diagnosis Date Noted  . H. influenzae infection 12/24/2018  . Influenza A 12/24/2018  . Chest pain 06/18/2017  . Hyponatremia 06/18/2017  . Tobacco dependence 06/18/2017  . 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 Left-sided weakness   . Chronic combined systolic (congestive) and diastolic (congestive) heart  failure (HCC) 07/26/2015  . Encounter for therapeutic drug monitoring 02/02/2014  . Peripheral arterial disease (HCC) 11/09/2013  . Dual implantable cardioverter-defibrillator in situ 06/01/2012  . Long term current use of anticoagulant 03/22/2011  . Essential hypertension, benign 05/29/2010  . CHEST PAIN 05/29/2010  . Hyperlipidemia 09/26/2009  . Peripheral vascular disease (HCC) 06/23/2009  . Cardiomyopathy, ischemic 11/29/2008  . CEREBROVASCULAR DISEASE 11/29/2008  . Anticardiolipin syndrome (HCC) 11/29/2008    Past Surgical History:  Procedure Laterality Date  . ABDOMINAL AORTAGRAM N/A 05/25/2014   Procedure: ABDOMINAL Ronny Flurry;  Surgeon: Iran Ouch, MD;  Location: [facility] CATH LAB;  Service: Cardiovascular;  Laterality: N/A;  . APPENDECTOMY    . CARDIAC DEFIBRILLATOR PLACEMENT     [employer] ICD  . EP IMPLANTABLE DEVICE N/A 03/01/2016   Procedure: ICD Generator Changeout;  Surgeon: [name], MD;  Location: [facility] INVASIVE CV LAB;  Service: Cardiovascular;  Laterality: N/A;  . L1 corpectomy   12/10  . Laryngeal polyp excision       OB History   No obstetric history on file.      Home Medications    Prior to Admission medications   Medication Sig Start Date End Date Taking? Authorizing Provider  carvedilol (COREG) 12.5 MG tablet TAKE 1 TABLET BY MOUTH TWICE DAILY WITH MEALS 02/20/18   [name], MD  clonazePAM (KLONOPIN) 1 MG tablet Take 1 mg by mouth 4 (four) times daily as needed for anxiety.    [provider]  doxycycline (VIBRAMYCIN) 100 MG capsule Take 1 capsule (100 mg total) by mouth 2 (two) times daily. 12/26/18   Kari Baars, MD  DULoxetine (CYMBALTA) 60 MG capsule Take 60 mg by mouth every morning.    [provider]  furosemide (LASIX) 40 MG tablet TAKE 1 TABLET BY MOUTH DAILY AS NEEDED FOR SWELLING. 12/21/18   [name], MD  furosemide (LASIX) 80 MG tablet TAKE 1 TABLET BY MOUTH DAILY 07/20/18   [name], MD    gabapentin (NEURONTIN) 300 MG capsule Take 300 mg by mouth 2 (two) times daily.    [provider]  HYDROcodone-acetaminophen (NORCO) 10-325 MG per tablet Take 0.5 tablets by mouth 4 (four) times daily as needed for moderate pain or severe pain. pain    [provider]  isosorbide mononitrate (IMDUR) 30 MG 24 hr tablet TAKE 1 TABLET BY MOUTH DAILY 07/20/18   [name], MD  losartan (COZAAR) 25 MG tablet TAKE 1/2 TABLET BY MOUTH EVERY DAY 07/08/18   [name], MD  Multiple Vitamins-Minerals (MULTIVITAMINS THER. W/MINERALS) TABS Take 1 tablet by mouth daily.      [provider]  nicotine (NICODERM CQ - DOSED IN MG/24 HR) 7 mg/24hr patch Place 1 patch (7 mg total) onto the skin daily. 06/19/17   Kari Baars, MD  nitroGLYCERIN (NITROSTAT) 0.4 MG SL tablet PLACE 1 TABLET UNDER THE TONGUE EVERY 5 MINUTES FOR 3 DOSES AS NEEDED CHEST PAIN 11/30/18   [name], MD  oseltamivir (TAMIFLU) 75 MG capsule Take 1 capsule (75 mg total) by mouth 2 (two) times daily. 12/26/18   Kari Baars, MD  potassium chloride SA (K-DUR,KLOR-CON) 20 MEQ tablet TAKE 1 TABLET BY MOUTH EVERY DAY 07/30/18   [name], MD  promethazine (PHENERGAN) 25 MG tablet Take 25 mg by mouth every 6 (six) hours as needed for nausea.    [provider]  ranitidine (ZANTAC) 150 MG tablet Take 150 mg by mouth 2 (two) times daily.    [provider]  spironolactone (ALDACTONE) 25 MG tablet TAKE 1 TABLET BY MOUTH EVERY DAY 07/20/18   [name], MD  warfarin (COUMADIN) 10 MG tablet Take 1 tablet daily except 1/2 tablet on Thursdays Patient taking differently: Take 5-10 mg by mouth See admin instructions. TAKES 5MG  ON TUESDAYS AND THURSDAYS AND TAKES 10MG  ON ALL OTHER DAYS 05/22/17   [name], MD    Family History Family History  Problem Relation Age of Onset  . Heart disease Father 60  . Ankylosing spondylitis Sister   . Stomach cancer Brother 8   . Heart attack Brother     Social History Social History   Tobacco Use  . Smoking status: Current Every Day Smoker    Packs/day: 1.00    Years: 43.00    Pack years: 43.00    Types: E-cigarettes    Start date: 03/03/1978    Last attempt to quit: 10/03/2013    Years since quitting: 5.2  . Smokeless tobacco: Never Used  . Tobacco comment: resumed cigarettes, stopped last week but continues to vape  Substance Use Topics  . Alcohol use: No    Alcohol/week: 0.0 standard drinks  . Drug use: No    Comment: Prior history of cocaine     Allergies   Metoclopramide hcl; Penicillins; Metoprolol; and Statins   Review of Systems Review of Systems  Constitutional: Negative.   HENT: Negative.   Respiratory: Positive for cough and shortness  of breath.   Cardiovascular: Negative.   Gastrointestinal: Positive for diarrhea.  Musculoskeletal: Negative.   Skin: Negative.   Neurological: Positive for weakness.  Psychiatric/Behavioral: Negative.   All other systems reviewed and are negative.    Physical Exam Updated Vital Signs BP (!) 122/58 (BP Location: Left Arm)   Pulse 84   Temp 100.2 F (37.9 C)   Resp 20   Ht 5\' 6"  (1.676 m)   Wt 101.6 kg   SpO2 92%   BMI 36.15 kg/m   Physical Exam Vitals signs and nursing note reviewed.  Constitutional:      Appearance: She is well-developed. She is ill-appearing.     Comments: Chronically ill-appearing  HENT:     Head: Normocephalic and atraumatic.  Eyes:     Conjunctiva/sclera: Conjunctivae normal.     Pupils: Pupils are equal, round, and reactive to light.  Neck:     Musculoskeletal: Neck supple.     Thyroid: No thyromegaly.     Trachea: No tracheal deviation.  Cardiovascular:     Rate and Rhythm: Normal rate and regular rhythm.     Heart sounds: No murmur.  Pulmonary:     Effort: Pulmonary effort is normal.     Comments: Diffuse rhonchi Abdominal:     General: Bowel sounds are normal. There is no distension.      Palpations: Abdomen is soft.     Tenderness: There is no abdominal tenderness.  Musculoskeletal: Normal range of motion.        General: No tenderness.  Skin:    General: Skin is warm and dry.     Findings: No rash.  Neurological:     Mental Status: She is alert and oriented to person, place, and time.     Coordination: Coordination normal.      ED Treatments / Results  Labs (all labs ordered are listed, but only abnormal results are displayed) Labs Reviewed  CBC WITH DIFFERENTIAL/PLATELET  BASIC METABOLIC PANEL  BRAIN NATRIURETIC PEPTIDE    EKG None  Radiology Dg Chest 2 View  Result Date: 12/31/2018 CLINICAL DATA:  Congestion, shortness of Breath EXAM: CHEST - 2 VIEW COMPARISON:  12/23/2018 FINDINGS: Left AICD remains in place, unchanged. Heart is borderline in size. Perihilar and lower lobe opacities and interstitial prominence could reflect early interstitial edema. No effusions or acute bony abnormality. IMPRESSION: Bilateral mid and lower lung interstitial prominence could reflect early interstitial edema. Electronically Signed   By: Charlett Nose M.D.   On: 12/31/2018 12:44    Procedures Procedures (including critical care time)  Medications Ordered in ED Medications - No data to display  Chest x-ray viewed by me Initial Impression / Assessment and Plan / ED Course  I have reviewed the triage vital signs and the nursing notes.  Pertinent labs & imaging results that were available during my care of the patient were reviewed by me and considered in my medical decision making (see chart for details).     Code sepsis called based on hypoxia, fever, source of infection felt to be respiratory.   3:20 PM feels somewhat improved after treatment with oxygen and intravenous antibiotics Sepsis - Repeat Assessment  Performed at:    320 pm  Vitals     Blood pressure 119/65, pulse 83, temperature (!) 102.3 F (39.1 C), temperature source Rectal, resp. rate (!) 30, height  5\' 6"  (1.676 m), weight 101.6 kg, SpO2 92 %.  Heart:     Regular rate and rhythm  Lungs:  Rhonchi  Capillary Refill:   <2 sec  Peripheral Pulse:   Radial pulse palpable  Skin:     Normal Color  Results for orders placed or performed during the hospital encounter of 12/31/18  Blood Culture (routine x 2)  Result Value Ref Range   Specimen Description BLOOD RIGHT ARM    Special Requests      BOTTLES DRAWN AEROBIC AND ANAEROBIC Blood Culture adequate volume Performed at [facility], [address] [place], [place] [address]    Culture PENDING    Report Status PENDING   Blood Culture (routine x 2)  Result Value Ref Range   Specimen Description BLOOD LEFT ARM    Special Requests      BOTTLES DRAWN AEROBIC AND ANAEROBIC Blood Culture adequate volume Performed at [facility], [address] [place], [place] [address]    Culture PENDING    Report Status PENDING   CBC with Differential/Platelet  Result Value Ref Range   WBC 14.4 (H) 4.0 - 10.5 K/uL   RBC 4.15 3.87 - 5.11 MIL/uL   Hemoglobin 11.9 (L) 12.0 - 15.0 g/dL   HCT 09.837.6 11.936.0 - 14.746.0 %   MCV 90.6 80.0 - 100.0 fL   MCH 28.7 26.0 - 34.0 pg   MCHC 31.6 30.0 - 36.0 g/dL   RDW 82.913.0 56.211.5 - 13.015.5 %   Platelets 371 150 - 400 K/uL   nRBC 0.0 0.0 - 0.2 %   Neutrophils Relative % 79 %   Neutro Abs 11.3 (H) 1.7 - 7.7 K/uL   Lymphocytes Relative 10 %   Lymphs Abs 1.5 0.7 - 4.0 K/uL   Monocytes Relative 10 %   Monocytes Absolute 1.5 (H) 0.1 - 1.0 K/uL   Eosinophils Relative 0 %   Eosinophils Absolute 0.0 0.0 - 0.5 K/uL   Basophils Relative 0 %   Basophils Absolute 0.0 0.0 - 0.1 K/uL   Immature Granulocytes 1 %   Abs Immature Granulocytes 0.08 (H) 0.00 - 0.07 K/uL  Basic metabolic panel  Result Value Ref Range   Sodium 135 135 - 145 mmol/L   Potassium 3.2 (L) 3.5 - 5.1 mmol/L   Chloride 95 (L) 98 - 111 mmol/L   CO2 31 22 - 32 mmol/L   Glucose, Bld 82 70 - 99 mg/dL   BUN 13 6 - 20 mg/dL   Creatinine, Ser 8.650.65 0.44 - 1.00  mg/dL   Calcium 8.7 (L) 8.9 - 10.3 mg/dL   GFR calc non Af Amer >60 >60 mL/min   GFR calc Af Amer >60 >60 mL/min   Anion gap 9 5 - 15  Brain natriuretic peptide  Result Value Ref Range   B Natriuretic Peptide 523.0 (H) 0.0 - 100.0 pg/mL  Protime-INR  Result Value Ref Range   Prothrombin Time 27.2 (H) 11.4 - 15.2 seconds   INR 2.56   Urinalysis, Routine w reflex microscopic  Result Value Ref Range   Color, Urine COLORLESS (A) YELLOW   APPearance CLEAR CLEAR   Specific Gravity, Urine 1.005 1.005 - 1.030   pH 6.0 5.0 - 8.0   Glucose, UA NEGATIVE NEGATIVE mg/dL   Hgb urine dipstick NEGATIVE NEGATIVE   Bilirubin Urine NEGATIVE NEGATIVE   Ketones, ur NEGATIVE NEGATIVE mg/dL   Protein, ur NEGATIVE NEGATIVE mg/dL   Nitrite NEGATIVE NEGATIVE   Leukocytes, UA NEGATIVE NEGATIVE  I-Stat CG4 Lactic Acid, ED  Result Value Ref Range   Lactic Acid, Venous 1.32 0.5 - 1.9 mmol/L   Dg Chest  2 View  Result Date: 12/31/2018 CLINICAL DATA:  Congestion, shortness of Breath EXAM: CHEST - 2 VIEW COMPARISON:  12/23/2018 FINDINGS: Left AICD remains in place, unchanged. Heart is borderline in size. Perihilar and lower lobe opacities and interstitial prominence could reflect early interstitial edema. No effusions or acute bony abnormality. IMPRESSION: Bilateral mid and lower lung interstitial prominence could reflect early interstitial edema. Electronically Signed   By: Charlett Nose M.D.   On: 12/31/2018 12:44   Dg Chest 2 View  Result Date: 12/24/2018 CLINICAL DATA:  Acute onset of cough, shortness of breath and chills. Chest tightness. EXAM: CHEST - 2 VIEW COMPARISON:  Chest radiograph performed 01/14/2018 FINDINGS: The lungs are well-aerated. Vascular congestion is noted. Increased interstitial markings raise concern for mild interstitial edema, though pneumonia could have a similar appearance. Peribronchial thickening is noted. There is no evidence of pleural effusion or pneumothorax. The heart is mildly  enlarged. A pacemaker/AICD is noted at the left chest wall, with leads ending at the right atrium and right ventricle. Lumbar spinal fusion hardware is partially imaged. No acute osseous abnormalities are seen. IMPRESSION: Vascular congestion and mild cardiomegaly. Increased interstitial markings raise concern for mild interstitial edema, though pneumonia could have a similar appearance. Peribronchial thickening noted. Electronically Signed   By: Roanna Raider M.D.   On: 12/24/2018 00:16   Ct Chest Wo Contrast  Result Date: 12/31/2018 CLINICAL DATA:  Cough.  Dyspnea.  Recent diagnosis of influenza. EXAM: CT CHEST WITHOUT CONTRAST TECHNIQUE: Multidetector CT imaging of the chest was performed following the standard protocol without IV contrast. COMPARISON:  Chest radiograph from earlier today. FINDINGS: Cardiovascular: Mild cardiomegaly. No significant pericardial effusion/thickening. Three-vessel coronary atherosclerosis. Two lead left subclavian ICD is noted with lead tips in the right atrium and right ventricular apex. Atherosclerotic nonaneurysmal thoracic aorta. Top-normal caliber main pulmonary artery (3.1 cm diameter). Mediastinum/Nodes: No discrete thyroid nodules. Unremarkable esophagus. No axillary adenopathy. Mild right paratracheal adenopathy measuring up to 1.3 cm (series 2/image 53). Enlarged 1.3 cm subcarinal node (series 2/image 67). Subjective mild bilateral hilar adenopathy, poorly delineated on this noncontrast scan. Lungs/Pleura: No pneumothorax. No pleural effusion. Extensive patchy consolidation and ground-glass opacity throughout the bilateral basilar lower lobes. Less prominent patchy peribronchovascular consolidation and ground-glass opacity throughout the remaining lungs involving all lung lobes. No discrete lung masses. No significant regions of bronchiectasis. Upper abdomen: Small hiatal hernia. Musculoskeletal: No aggressive appearing focal osseous lesions. Mild thoracic spondylosis.  Partially visualized left lateral spinal fusion hardware in the upper lumbar spine. IMPRESSION: 1. Extensive patchy consolidation and ground-glass opacity in the dependent lower lobes bilaterally, with less prominent patchy peribronchovascular consolidation and ground-glass opacity throughout the remaining lungs. Favor multilobar infectious pneumonia. Suggest follow-up chest imaging in 1-2 months to document resolution. 2. Mild cardiomegaly. 3. Mild mediastinal and bilateral hilar adenopathy, nonspecific, most likely reactive. 4. Three-vessel coronary atherosclerosis. Aortic Atherosclerosis (ICD10-I70.0). Electronically Signed   By: Delbert Phenix M.D.   On: 12/31/2018 15:08  Lab work consistent with leukocytosis and mild hypokalemia.  Pulse oximetry consistent with hypoxia.  Oral potassium supplementation ordered BNP is chronically elevated.  CT scan favors infectious etiology over congestive heart failure. Patient treated for healthcare associated pneumonia due to recent hospitalization. I counseled patient for 5 minutes on smoking cessation I consulted  Dr. Mariea Clonts from hospitalist service who will arrange for overnight stay. Final Clinical Impressions(s) / ED Diagnoses  Diagnosis #1 Healthcare associated pneumonia #2 hypoxia #3 sepsis #4 hypokalemia #5 tobacco abuse CRITICAL CARE Performed by: Doreatha Martin  Leiliana Foody Total critical care time: 35 minutes Critical care time was exclusive of separately billable procedures and treating other patients. Critical care was necessary to treat or prevent imminent or life-threatening deterioration. Critical care was time spent personally by me on the following activities: development of treatment plan with patient and/or surrogate as well as nursing, discussions with consultants, evaluation of patient's response to treatment, examination of patient, obtaining history from patient or surrogate, ordering and performing treatments and interventions, ordering and review  of laboratory studies, ordering and review of radiographic studies, pulse oximetry and re-evaluation of patient's condition. Final diagnoses:  None    ED Discharge Orders    None       Doug SouJacubowitz, Starlette Thurow, MD 12/31/18 [phone]

## 2018-12-31 NOTE — Progress Notes (Signed)
Patient arrives to room 309 at this time via stretcher from ED

## 2018-12-31 NOTE — H&P (Signed)
History and Physical    STEFHANIE AYRES RZN:[id number] DOB: December 27, 1961 DOA: 12/31/2018  PCP: Kari Baars, MD   Patient coming from: Home  Chief Complaint: SOB, COUGH  HPI: [name] [name] is a 58 y.o. 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  Cough is now productive of yellowish sputum.  Also reports fevers and chills.  Reports back pain from coughing.  Denies chest pain.  No lower extremity swelling redness or pain.  She is not on home O2.  Patient recently admitted 11/26- 11/29-for similar symptoms, was with influenza.  Stay complicated by thrombocytopenia platelets dropped to 20.  Hematology consulted, Dr. Melton Alar, thrombocytopenia thought to be secondary to infection improved to 26 by discharge.  And discharged on Tamiflu and doxycycline which she reports compliance with.  ED Course: T-max 102.3.  Tachypnea to 31.  Systolic blood pressure 105-150.  O2 sats 92%- 94% on 4 L O2.  WBC 14.4.  Normal lactic acid 1.3.  INR 2.5.  BNP 523.  Low K3.2.  UA clean. EKG showed T wave inversions lateral leads, appears to be new.  Chest x-ray bilateral mid and lower lung interstitial prominence could reflect early interstitial edema, subsequent CT chest without contrast-extensive patchy consolidation and groundglass opacity in the dependent lower lobes bilaterally, with less prominent patchy peribronchovascular consolidation and groundglass opacity throughout the remaining lungs. Patient started on IV vancomycin and aztreonam.  Hospitalist to admit for pneumonia.  Review of Systems: As per HPI all other systems reviewed and negative  Past Medical History:  Diagnosis Date  . Aneurysm (HCC)    internal carotid artery  . Anticardiolipin antibody positive    Chronic Coumadin  . Chronic combined systolic (congestive)  and diastolic (congestive) heart failure (HCC)   . Cocaine abuse in remission (HCC)   . Coronary atherosclerosis of native coronary artery    Previous invasive cardiac testing was done at a facility in [place], [place].  Occluded diagonal, Diffuse LAD disease, Occluded Cx, and non obstructive RCA.   . Essential hypertension, benign   . Headache   . History of pneumonia   . Ischemic cardiomyopathy    LVEF 30-35%  . Mixed hyperlipidemia   . PVD (peripheral vascular disease) (HCC)   . Raynaud's phenomenon   . Stroke [facility]) 2007   Total occlusion of the right internal carotid    Past Surgical History:  Procedure Laterality Date  . ABDOMINAL AORTAGRAM N/A 05/25/2014   Procedure: ABDOMINAL Ronny Flurry;  Surgeon: Iran Ouch, MD;  Location: [facility] CATH LAB;  Service: Cardiovascular;  Laterality: N/A;  . APPENDECTOMY    . CARDIAC DEFIBRILLATOR PLACEMENT     [employer] ICD  . EP IMPLANTABLE DEVICE N/A 03/01/2016   Procedure: ICD Generator Changeout;  Surgeon: [name], MD;  Location: [facility] INVASIVE CV LAB;  Service: Cardiovascular;  Laterality: N/A;  . L1 corpectomy   12/10  . Laryngeal polyp excision       reports that she has been smoking e-cigarettes. She started smoking about 40 years ago. She has a 43.00 pack-year smoking history. She has never used smokeless tobacco. She reports that she does not drink alcohol or use drugs.  Allergies  Allergen Reactions  . Metoclopramide Hcl Anaphylaxis    Non responsive  . Penicillins Anaphylaxis    Shock Has patient had a PCN reaction  causing immediate rash, facial/tongue/throat swelling, SOB or lightheadedness with hypotension: Yes Has patient had a PCN reaction causing severe rash involving mucus membranes or skin necrosis: No Has patient had a PCN reaction that required hospitalization Yes Has patient had a PCN reaction occurring within the last 10 years: No If all of the above answers are "NO", then may proceed with Cephalosporin use.   .  Metoprolol Other (See Comments)    Syncope   . Statins Other (See Comments)    Myalgias    Family History  Problem Relation Age of Onset  . Heart disease Father [id number]  . Ankylosing spondylitis Sister   . Stomach cancer Brother [id number]  . Heart attack Brother     Prior to Admission medications   Medication Sig Start Date End Date Taking? Authorizing Provider  carvedilol (COREG) 12.5 MG tablet TAKE 1 TABLET BY MOUTH TWICE DAILY WITH MEALS 02/20/18  Yes Jonelle SidleMcDowell, Samuel G, MD  clonazePAM (KLONOPIN) 1 MG tablet Take 1 mg by mouth 4 (four) times daily as needed for anxiety.   Yes [provider]  doxycycline (VIBRAMYCIN) 100 MG capsule Take 1 capsule (100 mg total) by mouth 2 (two) times daily. 12/26/18  Yes Kari BaarsHawkins, Edward, MD  DULoxetine (CYMBALTA) 60 MG capsule Take 60 mg by mouth every morning.   Yes [provider]  furosemide (LASIX) 40 MG tablet TAKE 1 TABLET BY MOUTH DAILY AS NEEDED FOR SWELLING. 12/21/18  Yes Jonelle SidleMcDowell, Samuel G, MD  furosemide (LASIX) 80 MG tablet TAKE 1 TABLET BY MOUTH DAILY 07/20/18  Yes Jonelle SidleMcDowell, Samuel G, MD  gabapentin (NEURONTIN) 300 MG capsule Take 300 mg by mouth 2 (two) times daily.   Yes [provider]  HYDROcodone-acetaminophen (NORCO) 10-325 MG per tablet Take 0.5 tablets by mouth 4 (four) times daily as needed for moderate pain or severe pain. pain   Yes [provider]  isosorbide mononitrate (IMDUR) 30 MG 24 hr tablet TAKE 1 TABLET BY MOUTH DAILY 07/20/18  Yes Jonelle SidleMcDowell, Samuel G, MD  losartan (COZAAR) 25 MG tablet TAKE 1/2 TABLET BY MOUTH EVERY DAY 07/08/18  Yes Jonelle SidleMcDowell, Samuel G, MD  Multiple Vitamins-Minerals (MULTIVITAMINS THER. W/MINERALS) TABS Take 1 tablet by mouth daily.     Yes [provider]  nicotine (NICODERM CQ - DOSED IN MG/24 HR) 7 mg/24hr patch Place 1 patch (7 mg total) onto the skin daily. 06/19/17  Yes Kari BaarsHawkins, Edward, MD  nitroGLYCERIN (NITROSTAT) 0.4 MG SL tablet PLACE 1 TABLET UNDER THE TONGUE  EVERY 5 MINUTES FOR 3 DOSES AS NEEDED CHEST PAIN 11/30/18  Yes Jonelle SidleMcDowell, Samuel G, MD  oseltamivir (TAMIFLU) 75 MG capsule Take 1 capsule (75 mg total) by mouth 2 (two) times daily. 12/26/18  Yes Kari BaarsHawkins, Edward, MD  potassium chloride SA (K-DUR,KLOR-CON) 20 MEQ tablet TAKE 1 TABLET BY MOUTH EVERY DAY 07/30/18  Yes Jonelle SidleMcDowell, Samuel G, MD  promethazine (PHENERGAN) 25 MG tablet Take 25 mg by mouth every 6 (six) hours as needed for nausea.   Yes [provider]  ranitidine (ZANTAC) 150 MG tablet Take 150 mg by mouth 2 (two) times daily.   Yes [provider]  spironolactone (ALDACTONE) 25 MG tablet TAKE 1 TABLET BY MOUTH EVERY DAY 07/20/18  Yes Jonelle SidleMcDowell, Samuel G, MD  warfarin (COUMADIN) 10 MG tablet Take 1 tablet daily except 1/2 tablet on Thursdays Patient taking differently: Take 5-10 mg by mouth See admin instructions. TAKES 5MG  ON TUESDAYS AND THURSDAYS AND TAKES 10MG  ON ALL OTHER DAYS 05/22/17  Yes  [name], MD    Physical Exam: Vitals:   12/31/18 1313 12/31/18 1315 12/31/18 1330 12/31/18 1400  BP:   (!) 146/81 119/65  Pulse:    83  Resp:  (!) 28 (!) 26 (!) 30  Temp: (!) 102.3 F (39.1 C)     TempSrc: Rectal     SpO2:      Weight:      Height:        Constitutional:   calm, comfortable, drowsy Vitals:   12/31/18 1313 12/31/18 1315 12/31/18 1330 12/31/18 1400  BP:   (!) 146/81 119/65  Pulse:    83  Resp:  (!) 28 (!) 26 (!) 30  Temp: (!) 102.3 F (39.1 C)     TempSrc: Rectal     SpO2:      Weight:      Height:       Eyes: PERRL, lids and conjunctivae normal ENMT: Mucous membranes are dry. Posterior pharynx clear of any exudate or lesions. Neck: normal, supple, no masses, no thyromegaly Respiratory: clear to auscultation bilaterally, no wheezing, no crackles. Normal respiratory effort. No accessory muscle use.  Cardiovascular: Regular rate and rhythm, no murmurs / rubs / gallops. No extremity edema. 2+ pedal pulses.  Abdomen: no tenderness, no masses  palpated. No hepatosplenomegaly. Bowel sounds positive.  Musculoskeletal: no clubbing / cyanosis. No joint deformity upper and lower extremities. Good ROM, no contractures. Normal muscle tone.  Skin: no rashes, lesions, ulcers. No induration Neurologic: CN 2-12 grossly intact. Strength 5/5 in all 4.  Psychiatric: Normal judgment and insight. Alert and oriented x 3. Normal mood.   Labs on Admission: I have personally reviewed following labs and imaging studies  CBC: Recent Labs  Lab 12/25/18 0543 12/26/18 0655 12/28/18 1020 12/31/18 1328  WBC 9.1 4.5 5.5 14.4*  NEUTROABS  --  2.3  --  11.3*  HGB 13.7 12.5 12.4 11.9*  HCT 42.2 38.8 38.5 37.6  MCV 89.4 90.0 88.9 90.6  PLT 20* 26* 57* 371   Basic Metabolic Panel: Recent Labs  Lab 12/25/18 0543 12/25/18 1327 12/28/18 1020 12/31/18 1328  NA 123* 123* 135 135  K 4.1 3.6 4.0 3.2*  CL 87* 88* 100 95*  CO2 26 26 24 31   GLUCOSE 98 134* 138* 82  BUN 14 15 11 13   CREATININE 0.70 0.72 0.68 0.65  CALCIUM 8.3* 8.1* 8.5* 8.7*   GFR: Estimated Creatinine Clearance: 95.6 mL/min (by C-G formula based on SCr of 0.65 mg/dL). Liver Function Tests: Recent Labs  Lab 12/25/18 1327  AST 37  ALT 29  ALKPHOS 43  BILITOT 0.4  PROT 6.8  ALBUMIN 3.2*   Coagulation Profile: Recent Labs  Lab 12/25/18 0543 12/26/18 0655 12/28/18 1020 12/31/18 1328  INR 1.52 1.58 1.90 2.56   Urine analysis:    Component Value Date/Time   COLORURINE COLORLESS (A) 12/31/2018 1315   APPEARANCEUR CLEAR 12/31/2018 1315   LABSPEC 1.005 12/31/2018 1315   PHURINE 6.0 12/31/2018 1315   GLUCOSEU NEGATIVE 12/31/2018 1315   HGBUR NEGATIVE 12/31/2018 1315   BILIRUBINUR NEGATIVE 12/31/2018 1315   KETONESUR NEGATIVE 12/31/2018 1315   PROTEINUR NEGATIVE 12/31/2018 1315   UROBILINOGEN 0.2 12/23/2011 1440   NITRITE NEGATIVE 12/31/2018 1315   LEUKOCYTESUR NEGATIVE 12/31/2018 1315    Radiological Exams on Admission: Dg Chest 2 View  Result Date:  12/31/2018 CLINICAL DATA:  Congestion, shortness of Breath EXAM: CHEST - 2 VIEW COMPARISON:  12/23/2018 FINDINGS: Left AICD remains in place, unchanged. Heart is  borderline in size. Perihilar and lower lobe opacities and interstitial prominence could reflect early interstitial edema. No effusions or acute bony abnormality. IMPRESSION: Bilateral mid and lower lung interstitial prominence could reflect early interstitial edema. Electronically Signed   By: Charlett Nose M.D.   On: 12/31/2018 12:44   Ct Chest Wo Contrast  Result Date: 12/31/2018 CLINICAL DATA:  Cough.  Dyspnea.  Recent diagnosis of influenza. EXAM: CT CHEST WITHOUT CONTRAST TECHNIQUE: Multidetector CT imaging of the chest was performed following the standard protocol without IV contrast. COMPARISON:  Chest radiograph from earlier today. FINDINGS: Cardiovascular: Mild cardiomegaly. No significant pericardial effusion/thickening. Three-vessel coronary atherosclerosis. Two lead left subclavian ICD is noted with lead tips in the right atrium and right ventricular apex. Atherosclerotic nonaneurysmal thoracic aorta. Top-normal caliber main pulmonary artery (3.1 cm diameter). Mediastinum/Nodes: No discrete thyroid nodules. Unremarkable esophagus. No axillary adenopathy. Mild right paratracheal adenopathy measuring up to 1.3 cm (series 2/image 53). Enlarged 1.3 cm subcarinal node (series 2/image 67). Subjective mild bilateral hilar adenopathy, poorly delineated on this noncontrast scan. Lungs/Pleura: No pneumothorax. No pleural effusion. Extensive patchy consolidation and ground-glass opacity throughout the bilateral basilar lower lobes. Less prominent patchy peribronchovascular consolidation and ground-glass opacity throughout the remaining lungs involving all lung lobes. No discrete lung masses. No significant regions of bronchiectasis. Upper abdomen: Small hiatal hernia. Musculoskeletal: No aggressive appearing focal osseous lesions. Mild thoracic  spondylosis. Partially visualized left lateral spinal fusion hardware in the upper lumbar spine. IMPRESSION: 1. Extensive patchy consolidation and ground-glass opacity in the dependent lower lobes bilaterally, with less prominent patchy peribronchovascular consolidation and ground-glass opacity throughout the remaining lungs. Favor multilobar infectious pneumonia. Suggest follow-up chest imaging in 1-2 months to document resolution. 2. Mild cardiomegaly. 3. Mild mediastinal and bilateral hilar adenopathy, nonspecific, most likely reactive. 4. Three-vessel coronary atherosclerosis. Aortic Atherosclerosis (ICD10-I70.0). Electronically Signed   By: Delbert Phenix M.D.   On: 12/31/2018 15:08    EKG: Independently reviewed.  T wave inversions lateral leads  Assessment/Plan Active Problems:   Pneumonia   Pneumonia- Hospital associated Vs post- influenza bacterial pneumonia.  Meets sepsis criteria without lactic acidosis.  CT chest shows lower bilateral multilobar infection.  Currently requiring 4 L O2, sats >92%.  Blood pressure still soft-systolic 105 - 150. -Continue IV vancomycin and aztreonam started in ED -Follow-up blood cultures -Obtain sputum cultures -Mucolytics, supplemental O2, DuoNebs as needed - BMP, CBC a.m  Abnormal EKG-EKG shows T wave inversions lateral leads.  Patient denies chest pain.  EKG changes likely related to pneumonia with sepsis. No CAD hx. - EKG a.m - Trend trops   Diastolic and systolic CHF- appears stable.  Last Echo 05/2017- EF 30%, G1DD.  Defibrillator status.  Follows with Dr. Diona Browner. -Hold home Coreg, Lasix, Imdur, losartan spironolactone with soft blood pressure and in the setting of sepsis.  Hypercoagulable state with anticardiolipin antibody- Hgb stable 11.9, platelets improved to 371 today from recent admission, platelets dropped to 20 2/2 Influenza. -warfarin From per pharmacy  HTN-systolic 105-150s. -On home antihypertensives in the setting of sepsis, with  soft blood pressure.   DVT prophylaxis: Warfarin Code Status: Full Family Communication: None at bedside Disposition Plan: > 2 days Consults called: None Admission status: Inpt, Tele   Melinna Linarez Wendall Stade MD [facility] Pager [phone] From 3PM- 11PM.  Otherwise please contact night-coverage [email or web address] Password [facility]  12/31/2018, 4:43 PM

## 2018-12-31 NOTE — ED Notes (Signed)
Lab to draw I-stat lactic-this nurse made 2 attempts.

## 2019-01-01 DIAGNOSIS — B002 Herpesviral gingivostomatitis and pharyngotonsillitis: Secondary | ICD-10-CM | POA: Diagnosis not present

## 2019-01-01 DIAGNOSIS — J440 Chronic obstructive pulmonary disease with acute lower respiratory infection: Secondary | ICD-10-CM | POA: Diagnosis not present

## 2019-01-01 DIAGNOSIS — D6861 Antiphospholipid syndrome: Secondary | ICD-10-CM | POA: Diagnosis not present

## 2019-01-01 DIAGNOSIS — I509 Heart failure, unspecified: Secondary | ICD-10-CM | POA: Diagnosis not present

## 2019-01-01 DIAGNOSIS — I5042 Chronic combined systolic (congestive) and diastolic (congestive) heart failure: Secondary | ICD-10-CM | POA: Diagnosis not present

## 2019-01-01 DIAGNOSIS — Z79899 Other long term (current) drug therapy: Secondary | ICD-10-CM | POA: Diagnosis not present

## 2019-01-01 DIAGNOSIS — I25119 Atherosclerotic heart disease of native coronary artery with unspecified angina pectoris: Secondary | ICD-10-CM | POA: Diagnosis not present

## 2019-01-01 DIAGNOSIS — J9601 Acute respiratory failure with hypoxia: Secondary | ICD-10-CM | POA: Diagnosis not present

## 2019-01-01 DIAGNOSIS — F17290 Nicotine dependence, other tobacco product, uncomplicated: Secondary | ICD-10-CM | POA: Diagnosis not present

## 2019-01-01 DIAGNOSIS — J189 Pneumonia, unspecified organism: Secondary | ICD-10-CM | POA: Diagnosis not present

## 2019-01-01 DIAGNOSIS — Z7901 Long term (current) use of anticoagulants: Secondary | ICD-10-CM | POA: Diagnosis not present

## 2019-01-01 LAB — BASIC METABOLIC PANEL
Anion gap: 6 (ref 5–15)
BUN: 13 mg/dL (ref 6–20)
CO2: 30 mmol/L (ref 22–32)
Calcium: 8.2 mg/dL — ABNORMAL LOW (ref 8.9–10.3)
Chloride: 101 mmol/L (ref 98–111)
Creatinine, Ser: 0.63 mg/dL (ref 0.44–1.00)
GFR calc Af Amer: >60 mL/min (ref >60)
GFR calc non Af Amer: >60 mL/min (ref >60)
Glucose, Bld: 113 mg/dL — ABNORMAL HIGH (ref 70–99)
Potassium: 4.1 mmol/L (ref 3.5–5.1)
Sodium: 137 mmol/L (ref 135–145)

## 2019-01-01 LAB — TROPONIN I: Troponin I: <0.03 ng/mL (ref <0.03)

## 2019-01-01 LAB — CBC
HCT: 34.8 % — ABNORMAL LOW (ref 36.0–46.0)
Hemoglobin: 10.8 g/dL — ABNORMAL LOW (ref 12.0–15.0)
MCH: 28.2 pg (ref 26.0–34.0)
MCHC: 31 g/dL (ref 30.0–36.0)
MCV: 90.9 fL (ref 80.0–100.0)
Platelets: 369 10*3/uL (ref 150–400)
RBC: 3.83 MIL/uL — ABNORMAL LOW (ref 3.87–5.11)
RDW: 13.2 % (ref 11.5–15.5)
WBC: 9.4 10*3/uL (ref 4.0–10.5)
nRBC: 0 % (ref 0.0–0.2)

## 2019-01-01 LAB — PROTIME-INR
INR: 3.54
Prothrombin Time: 34.9 seconds — ABNORMAL HIGH (ref 11.4–15.2)

## 2019-01-01 LAB — MAGNESIUM: Magnesium: 2 mg/dL (ref 1.7–2.4)

## 2019-01-01 MED ORDER — NICOTINE 21 MG/24HR TD PT24
21.0000 mg | MEDICATED_PATCH | Freq: Every day | TRANSDERMAL | Status: DC
  Administered 2019-01-02 – 2019-01-06 (×6): 21 mg via TRANSDERMAL
  Filled 2019-01-01 (×6): qty 1

## 2019-01-01 MED ORDER — SODIUM CHLORIDE 0.9 % IV SOLN
2.0000 g | Freq: Two times a day (BID) | INTRAVENOUS | Status: DC
  Administered 2019-01-01 – 2019-01-05 (×8): 2 g via INTRAVENOUS
  Filled 2019-01-01 (×11): qty 2

## 2019-01-01 MED ORDER — IPRATROPIUM-ALBUTEROL 0.5-2.5 (3) MG/3ML IN SOLN
3.0000 mL | Freq: Three times a day (TID) | RESPIRATORY_TRACT | Status: DC
  Administered 2019-01-02 – 2019-01-06 (×13): 3 mL via RESPIRATORY_TRACT
  Filled 2019-01-01 (×13): qty 3

## 2019-01-01 NOTE — Progress Notes (Signed)
Pharmacy Antibiotic Note  [name] [name] is a 58 y.o. female admitted on 12/31/2018 with pneumonia.  Pharmacy has been consulted for vancomycin and cefepime  Dosing.  Patient has tolerated cephalosporins in the past, so aztreonam has been discontinued.  Plan: dc aztreonam and start cefepime 2g IV q12h Continue vancomycin 1g IV q12h  (Goal VTR: 15-46mcg/mL) Pharmacy will continue to monitor patient progress, cultures and vancomycin troughs as clinically indicated.  Height: 5\' 6"  (167.6 cm) Weight: 224 lb (101.6 kg) IBW/kg (Calculated) : 59.3  Temp (24hrs), Avg:99.6 F (37.6 C), Min:98.2 F (36.8 C), Max:102.3 F (39.1 C)  Recent Labs  Lab 12/25/18 1327 12/26/18 0655 12/28/18 1020 12/31/18 1328 12/31/18 1344 12/31/18 1646 01/01/19 0119  WBC  --  4.5 5.5 14.4*  --   --  9.4  CREATININE 0.72  --  0.68 0.65  --   --  0.63  LATICACIDVEN  --   --   --   --  1.32 1.09  --     Estimated Creatinine Clearance: 95.6 mL/min (by C-G formula based on SCr of 0.63 mg/dL).     Thank you for allowing pharmacy to be a part of this patient's care.  Tama High 01/01/2019 11:56 AM

## 2019-01-01 NOTE — Progress Notes (Signed)
Subjective: She is here with bilateral pneumonia.  She had been admitted with influenza and was discharged home but then had more trouble.  I saw her in my office twice earlier this week and she seemed to be improving drastically when I saw her on the 31st.  However yesterday she called my office and said that she had been doing okay and then suddenly had severe shortness of breath and felt like she was "drowning".  She was directed to the emergency department where she was found to have bilateral pneumonia.  She says she feels a little bit better now.  She is coughing up sputum.  Objective: Vital signs in last 24 hours: Temp:  [98.2 F (36.8 C)-102.3 F (39.1 C)] 98.4 F (36.9 C) (01/02 2221) Pulse Rate:  [59-89] 61 (01/02 2221) Resp:  [18-31] 20 (01/02 2221) BP: (99-150)/(50-81) 99/50 (01/02 2221) SpO2:  [92 %-100 %] 100 % (01/02 2221) Weight:  [101.6 kg] 101.6 kg (01/02 1125) Weight change:     Intake/Output from previous day: 01/02 0701 - 01/03 0700 In: 295.6 [IV Piggyback:295.6] Out: -   PHYSICAL EXAM General appearance: alert, cooperative and mild distress Resp: rhonchi bilaterally Cardio: regular rate and rhythm, S1, S2 normal, no murmur, click, rub or gallop GI: soft, non-tender; bowel sounds normal; no masses,  no organomegaly Extremities: extremities normal, atraumatic, no cyanosis or edema  Lab Results:  Results for orders placed or performed during the hospital encounter of 12/31/18 (from the past 48 hour(s))  Urinalysis, Routine w reflex microscopic     Status: Abnormal   Collection Time: 12/31/18  1:15 PM  Result Value Ref Range   Color, Urine COLORLESS (A) YELLOW   APPearance CLEAR CLEAR   Specific Gravity, Urine 1.005 1.005 - 1.030   pH 6.0 5.0 - 8.0   Glucose, UA NEGATIVE NEGATIVE mg/dL   Hgb urine dipstick NEGATIVE NEGATIVE   Bilirubin Urine NEGATIVE NEGATIVE   Ketones, ur NEGATIVE NEGATIVE mg/dL   Protein, ur NEGATIVE NEGATIVE mg/dL   Nitrite NEGATIVE  NEGATIVE   Leukocytes, UA NEGATIVE NEGATIVE    Comment: Performed at [facility], [address], [place], [place] [address]  CBC with Differential/Platelet     Status: Abnormal   Collection Time: 12/31/18  1:28 PM  Result Value Ref Range   WBC 14.4 (H) 4.0 - 10.5 K/uL   RBC 4.15 3.87 - 5.11 MIL/uL   Hemoglobin 11.9 (L) 12.0 - 15.0 g/dL   HCT 60.4 54.0 - 98.1 %   MCV 90.6 80.0 - 100.0 fL   MCH 28.7 26.0 - 34.0 pg   MCHC 31.6 30.0 - 36.0 g/dL   RDW 19.1 47.8 - 29.5 %   Platelets 371 150 - 400 K/uL   nRBC 0.0 0.0 - 0.2 %   Neutrophils Relative % 79 %   Neutro Abs 11.3 (H) 1.7 - 7.7 K/uL   Lymphocytes Relative 10 %   Lymphs Abs 1.5 0.7 - 4.0 K/uL   Monocytes Relative 10 %   Monocytes Absolute 1.5 (H) 0.1 - 1.0 K/uL   Eosinophils Relative 0 %   Eosinophils Absolute 0.0 0.0 - 0.5 K/uL   Basophils Relative 0 %   Basophils Absolute 0.0 0.0 - 0.1 K/uL   Immature Granulocytes 1 %   Abs Immature Granulocytes 0.08 (H) 0.00 - 0.07 K/uL    Comment: Performed at [facility], [address] [place], [place] [address]  Basic metabolic panel     Status: Abnormal   Collection Time: 12/31/18  1:28 PM  Result Value Ref Range   Sodium 135 135 - 145 mmol/L   Potassium 3.2 (L) 3.5 - 5.1 mmol/L   Chloride 95 (L) 98 - 111 mmol/L   CO2 31 22 - 32 mmol/L   Glucose, Bld 82 70 - 99 mg/dL   BUN 13 6 - 20 mg/dL   Creatinine, Ser 0.48 0.44 - 1.00 mg/dL   Calcium 8.7 (L) 8.9 - 10.3 mg/dL   GFR calc non Af Amer >60 >60 mL/min   GFR calc Af Amer >60 >60 mL/min   Anion gap 9 5 - 15    Comment: Performed at [facility], [address] [place], [place] [address]  Brain natriuretic peptide     Status: Abnormal   Collection Time: 12/31/18  1:28 PM  Result Value Ref Range   B Natriuretic Peptide 523.0 (H) 0.0 - 100.0 pg/mL    Comment: Performed at [facility], [address] [place], [place] [address]  Protime-INR     Status: Abnormal   Collection Time: 12/31/18  1:28 PM  Result Value Ref Range    Prothrombin Time 27.2 (H) 11.4 - 15.2 seconds   INR 2.56     Comment: Performed at [facility], [address], [place], [place] [address]  Blood Culture (routine x 2)     Status: None (Preliminary result)   Collection Time: 12/31/18  1:28 PM  Result Value Ref Range   Specimen Description BLOOD RIGHT ARM    Special Requests      BOTTLES DRAWN AEROBIC AND ANAEROBIC Blood Culture adequate volume   Culture      NO GROWTH < 24 HOURS Performed at [facility], [address], [place], [place] [address]    Report Status PENDING   Troponin I - Add-On to previous collection     Status: None   Collection Time: 12/31/18  1:28 PM  Result Value Ref Range   Troponin I <0.03 <0.03 ng/mL    Comment: Performed at [facility], [address], [place], [place] [address]  Blood Culture (routine x 2)     Status: None (Preliminary result)   Collection Time: 12/31/18  1:35 PM  Result Value Ref Range   Specimen Description BLOOD LEFT ARM    Special Requests      BOTTLES DRAWN AEROBIC AND ANAEROBIC Blood Culture adequate volume   Culture      NO GROWTH < 24 HOURS Performed at [facility], [address], [place], [place] [address]    Report Status PENDING   I-Stat CG4 Lactic Acid, ED     Status: None   Collection Time: 12/31/18  1:44 PM  Result Value Ref Range   Lactic Acid, Venous 1.32 0.5 - 1.9 mmol/L  I-Stat CG4 Lactic Acid, ED     Status: None   Collection Time: 12/31/18  4:46 PM  Result Value Ref Range   Lactic Acid, Venous 1.09 0.5 - 1.9 mmol/L  Troponin I - Now Then Q6H     Status: None   Collection Time: 12/31/18  7:23 PM  Result Value Ref Range   Troponin I <0.03 <0.03 ng/mL    Comment: Performed at [facility], [address], [place], [place] [address]  Troponin I - Now Then Q6H     Status: None   Collection Time: 01/01/19  1:19 AM  Result Value Ref Range   Troponin I <0.03 <0.03 ng/mL    Comment: Performed at [facility], [address], [place],  [place] [address]  Protime-INR      Status: Abnormal   Collection Time: 01/01/19  1:19 AM  Result Value Ref Range   Prothrombin Time 34.9 (H) 11.4 - 15.2 seconds   INR 3.54     Comment: Performed at [facility], [address], [place], [place] [address]  CBC     Status: Abnormal   Collection Time: 01/01/19  1:19 AM  Result Value Ref Range   WBC 9.4 4.0 - 10.5 K/uL   RBC 3.83 (L) 3.87 - 5.11 MIL/uL   Hemoglobin 10.8 (L) 12.0 - 15.0 g/dL   HCT 94.7 (L) 09.6 - 28.3 %   MCV 90.9 80.0 - 100.0 fL   MCH 28.2 26.0 - 34.0 pg   MCHC 31.0 30.0 - 36.0 g/dL   RDW 66.2 94.7 - 65.4 %   Platelets 369 150 - 400 K/uL   nRBC 0.0 0.0 - 0.2 %    Comment: Performed at [facility], [address], [place], [place] [address]  Basic metabolic panel     Status: Abnormal   Collection Time: 01/01/19  1:19 AM  Result Value Ref Range   Sodium 137 135 - 145 mmol/L   Potassium 4.1 3.5 - 5.1 mmol/L    Comment: DELTA CHECK NOTED   Chloride 101 98 - 111 mmol/L   CO2 30 22 - 32 mmol/L   Glucose, Bld 113 (H) 70 - 99 mg/dL   BUN 13 6 - 20 mg/dL   Creatinine, Ser 4.65 0.44 - 1.00 mg/dL   Calcium 8.2 (L) 8.9 - 10.3 mg/dL   GFR calc non Af Amer >60 >60 mL/min   GFR calc Af Amer >60 >60 mL/min   Anion gap 6 5 - 15    Comment: Performed at [facility], [address] [place], [place] [address]  Magnesium     Status: None   Collection Time: 01/01/19  1:19 AM  Result Value Ref Range   Magnesium 2.0 1.7 - 2.4 mg/dL    Comment: Performed at [facility], [address], [place], [place] [address]    ABGS No results for input(s): PHART, PO2ART, TCO2, HCO3 in the last 72 hours.  Invalid input(s): PCO2 CULTURES Recent Results (from the past 240 hour(s))  Culture, blood (routine x 2)     Status: None   Collection Time: 12/23/18 11:35 PM  Result Value Ref Range Status   Specimen Description BLOOD RIGHT ANTECUBITAL  Final   Special Requests   Final    BOTTLES DRAWN AEROBIC AND ANAEROBIC Blood Culture adequate volume   Culture   Final    NO GROWTH  5 DAYS Performed at [facility], [address] [place], [place] [address]    Report Status 12/28/2018 FINAL  Final  Culture, blood (routine x 2)     Status: None   Collection Time: 12/23/18 11:50 PM  Result Value Ref Range Status   Specimen Description BLOOD LEFT ANTECUBITAL  Final   Special Requests   Final    BOTTLES DRAWN AEROBIC AND ANAEROBIC Blood Culture adequate volume   Culture   Final    NO GROWTH 5 DAYS Performed at [facility], [address] [place], [place] [address]    Report Status 12/28/2018 FINAL  Final  Blood Culture (routine x 2)     Status: None (Preliminary result)   Collection Time: 12/31/18  1:28 PM  Result Value Ref Range Status   Specimen Description BLOOD RIGHT ARM  Final   Special Requests  Final    BOTTLES DRAWN AEROBIC AND ANAEROBIC Blood Culture adequate volume   Culture   Final    NO GROWTH < 24 HOURS Performed at [facility], [address] [place] [address], [place], [place] [address]    Report Status PENDING  Incomplete  Blood Culture (routine x 2)     Status: None (Preliminary result)   Collection Time: 12/31/18  1:35 PM  Result Value Ref Range Status   Specimen Description BLOOD LEFT ARM  Final   Special Requests   Final    BOTTLES DRAWN AEROBIC AND ANAEROBIC Blood Culture adequate volume   Culture   Final    NO GROWTH < 24 HOURS Performed at [facility], [address], [place], [place] [address]    Report Status PENDING  Incomplete   Studies/Results: Dg Chest 2 View  Result Date: 12/31/2018 CLINICAL DATA:  Congestion, shortness of Breath EXAM: CHEST - 2 VIEW COMPARISON:  12/23/2018 FINDINGS: Left AICD remains in place, unchanged. Heart is borderline in size. Perihilar and lower lobe opacities and interstitial prominence could reflect early interstitial edema. No effusions or acute bony abnormality. IMPRESSION: Bilateral mid and lower lung interstitial prominence could reflect early interstitial edema. Electronically Signed   By: Charlett Nose M.D.   On:  12/31/2018 12:44   Ct Chest Wo Contrast  Result Date: 12/31/2018 CLINICAL DATA:  Cough.  Dyspnea.  Recent diagnosis of influenza. EXAM: CT CHEST WITHOUT CONTRAST TECHNIQUE: Multidetector CT imaging of the chest was performed following the standard protocol without IV contrast. COMPARISON:  Chest radiograph from earlier today. FINDINGS: Cardiovascular: Mild cardiomegaly. No significant pericardial effusion/thickening. Three-vessel coronary atherosclerosis. Two lead left subclavian ICD is noted with lead tips in the right atrium and right ventricular apex. Atherosclerotic nonaneurysmal thoracic aorta. Top-normal caliber main pulmonary artery (3.1 cm diameter). Mediastinum/Nodes: No discrete thyroid nodules. Unremarkable esophagus. No axillary adenopathy. Mild right paratracheal adenopathy measuring up to 1.3 cm (series 2/image 53). Enlarged 1.3 cm subcarinal node (series 2/image 67). Subjective mild bilateral hilar adenopathy, poorly delineated on this noncontrast scan. Lungs/Pleura: No pneumothorax. No pleural effusion. Extensive patchy consolidation and ground-glass opacity throughout the bilateral basilar lower lobes. Less prominent patchy peribronchovascular consolidation and ground-glass opacity throughout the remaining lungs involving all lung lobes. No discrete lung masses. No significant regions of bronchiectasis. Upper abdomen: Small hiatal hernia. Musculoskeletal: No aggressive appearing focal osseous lesions. Mild thoracic spondylosis. Partially visualized left lateral spinal fusion hardware in the upper lumbar spine. IMPRESSION: 1. Extensive patchy consolidation and ground-glass opacity in the dependent lower lobes bilaterally, with less prominent patchy peribronchovascular consolidation and ground-glass opacity throughout the remaining lungs. Favor multilobar infectious pneumonia. Suggest follow-up chest imaging in 1-2 months to document resolution. 2. Mild cardiomegaly. 3. Mild mediastinal and  bilateral hilar adenopathy, nonspecific, most likely reactive. 4. Three-vessel coronary atherosclerosis. Aortic Atherosclerosis (ICD10-I70.0). Electronically Signed   By: Delbert Phenix M.D.   On: 12/31/2018 15:08    Medications:  Prior to Admission:  Medications Prior to Admission  Medication Sig Dispense Refill Last Dose  . carvedilol (COREG) 12.5 MG tablet TAKE 1 TABLET BY MOUTH TWICE DAILY WITH MEALS 180 tablet 3 12/31/2018 at 1000  . clonazePAM (KLONOPIN) 1 MG tablet Take 1 mg by mouth 4 (four) times daily as needed for anxiety.   12/31/2018 at Unknown time  . doxycycline (VIBRAMYCIN) 100 MG capsule Take 1 capsule (100 mg total) by mouth 2 (two) times daily. 10 capsule 0 12/31/2018 at Unknown time  . DULoxetine (CYMBALTA) 60 MG capsule Take  60 mg by mouth every morning.   12/31/2018 at Unknown time  . furosemide (LASIX) 40 MG tablet TAKE 1 TABLET BY MOUTH DAILY AS NEEDED FOR SWELLING. 30 tablet 6   . furosemide (LASIX) 80 MG tablet TAKE 1 TABLET BY MOUTH DAILY 90 tablet 1 12/31/2018 at Unknown time  . gabapentin (NEURONTIN) 300 MG capsule Take 300 mg by mouth 2 (two) times daily.   12/31/2018 at Unknown time  . HYDROcodone-acetaminophen (NORCO) 10-325 MG per tablet Take 0.5 tablets by mouth 4 (four) times daily as needed for moderate pain or severe pain. pain   12/31/2018 at Unknown time  . isosorbide mononitrate (IMDUR) 30 MG 24 hr tablet TAKE 1 TABLET BY MOUTH DAILY 90 tablet 1 12/31/2018 at Unknown time  . losartan (COZAAR) 25 MG tablet TAKE 1/2 TABLET BY MOUTH EVERY DAY 45 tablet 1 12/31/2018 at Unknown time  . Multiple Vitamins-Minerals (MULTIVITAMINS THER. W/MINERALS) TABS Take 1 tablet by mouth daily.     12/31/2018 at Unknown time  . nicotine (NICODERM CQ - DOSED IN MG/24 HR) 7 mg/24hr patch Place 1 patch (7 mg total) onto the skin daily. 28 patch 0 12/31/2018 at Unknown time  . nitroGLYCERIN (NITROSTAT) 0.4 MG SL tablet PLACE 1 TABLET UNDER THE TONGUE EVERY 5 MINUTES FOR 3 DOSES AS NEEDED CHEST PAIN 25  tablet 3 Taking  . oseltamivir (TAMIFLU) 75 MG capsule Take 1 capsule (75 mg total) by mouth 2 (two) times daily. 6 capsule 0 12/31/2018 at Unknown time  . potassium chloride SA (K-DUR,KLOR-CON) 20 MEQ tablet TAKE 1 TABLET BY MOUTH EVERY DAY 90 tablet 3 12/31/2018 at Unknown time  . promethazine (PHENERGAN) 25 MG tablet Take 25 mg by mouth every 6 (six) hours as needed for nausea.   12/31/2018 at Unknown time  . ranitidine (ZANTAC) 150 MG tablet Take 150 mg by mouth 2 (two) times daily.   12/31/2018 at Unknown time  . spironolactone (ALDACTONE) 25 MG tablet TAKE 1 TABLET BY MOUTH EVERY DAY 90 tablet 1 12/31/2018 at Unknown time  . warfarin (COUMADIN) 10 MG tablet Take 1 tablet daily except 1/2 tablet on Thursdays (Patient taking differently: Take 5-10 mg by mouth See admin instructions. TAKES 5MG  ON TUESDAYS AND THURSDAYS AND TAKES 10MG  ON ALL OTHER DAYS) 30 tablet 3 12/31/2018 at 1000   Scheduled: . dextromethorphan-guaiFENesin  1 tablet Oral BID  . DULoxetine  60 mg Oral q morning - 10a  . gabapentin  300 mg Oral BID  . potassium chloride SA  20 mEq Oral Daily  . spironolactone  25 mg Oral Daily  . warfarin  10 mg Oral Once per day on Sun Mon Wed Fri Sat  . [START ON 01/05/2019] warfarin  5 mg Oral Once per day on Tue Thu  . Warfarin - Pharmacist Dosing Inpatient   Does not apply q1800   Continuous: . aztreonam 2 g (01/01/19 [id number])  . vancomycin 1,000 mg (01/01/19 0758)   FAO:ZHYQMVHQIOPRN:clonazePAM, HYDROcodone-acetaminophen, ipratropium-albuterol, ondansetron **OR** ondansetron (ZOFRAN) IV  Assesment: She has healthcare associated pneumonia post influenza.  This is bilateral.  She is on appropriate treatment.  She has some element of COPD as well.  She has heart failure which seems stable.  He has coronary disease but she is not having any chest pain  She has an implantable defibrillator/pacemaker but this has not fired  She has anxiety which is pretty severe  She has ischemic cardiomyopathy stable Active  Problems:   Pneumonia    Plan: Continue current  treatments.    LOS: 1 day   Fredirick Maudlin 01/01/2019, 8:33 AM

## 2019-01-01 NOTE — Plan of Care (Signed)

## 2019-01-01 NOTE — Progress Notes (Signed)
Patient admits being a chronic smoker. Mid level paged for a possible nicotine patch. Order placed for patch by mid level

## 2019-01-01 NOTE — Progress Notes (Addendum)
ANTICOAGULATION CONSULT NOTE - Follow Up Consult  Pharmacy Consult for warfarin Indication: Hypercoagulable state with anti-cardiolipin antibody  Home dose: warfarin 5mg  po Tuesday and Thursdays and  warfarin 10mg  po all other days    Patient Measurements: Height: 5\' 6"  (167.6 cm) Weight: 224 lb (101.6 kg) IBW/kg (Calculated) : 59.3     Labs: Recent Labs    12/31/18 1328 12/31/18 1923 01/01/19 0119  HGB 11.9*  --  10.8*  HCT 37.6  --  34.8*  PLT 371  --  369  LABPROT 27.2*  --  34.9*  INR 2.56  --  3.54  CREATININE 0.65  --  0.63  TROPONINI <0.03 <0.03 <0.03    Estimated Creatinine Clearance: 95.6 mL/min (by C-G formula based on SCr of 0.63 mg/dL).   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 INR is back in range today, so will give half of usual home dose today in light of recent INR bump.     Goal of Therapy:  INR 2-3 Monitor platelets by anticoagulation protocol: Yes   Plan:  Give warfarin 5mg  x1 dose today Daily INR  Monitor for S&S of bleeding   Tama High 01/01/2019,10:52 AM

## 2019-01-02 DIAGNOSIS — I509 Heart failure, unspecified: Secondary | ICD-10-CM | POA: Diagnosis not present

## 2019-01-02 DIAGNOSIS — I5042 Chronic combined systolic (congestive) and diastolic (congestive) heart failure: Secondary | ICD-10-CM | POA: Diagnosis not present

## 2019-01-02 DIAGNOSIS — J189 Pneumonia, unspecified organism: Secondary | ICD-10-CM | POA: Diagnosis not present

## 2019-01-02 DIAGNOSIS — I25119 Atherosclerotic heart disease of native coronary artery with unspecified angina pectoris: Secondary | ICD-10-CM | POA: Diagnosis not present

## 2019-01-02 DIAGNOSIS — Z7901 Long term (current) use of anticoagulants: Secondary | ICD-10-CM | POA: Diagnosis not present

## 2019-01-02 DIAGNOSIS — J440 Chronic obstructive pulmonary disease with acute lower respiratory infection: Secondary | ICD-10-CM | POA: Diagnosis not present

## 2019-01-02 DIAGNOSIS — Z79899 Other long term (current) drug therapy: Secondary | ICD-10-CM | POA: Diagnosis not present

## 2019-01-02 DIAGNOSIS — J9601 Acute respiratory failure with hypoxia: Secondary | ICD-10-CM | POA: Diagnosis not present

## 2019-01-02 DIAGNOSIS — B002 Herpesviral gingivostomatitis and pharyngotonsillitis: Secondary | ICD-10-CM | POA: Diagnosis not present

## 2019-01-02 DIAGNOSIS — F17290 Nicotine dependence, other tobacco product, uncomplicated: Secondary | ICD-10-CM | POA: Diagnosis not present

## 2019-01-02 DIAGNOSIS — D6861 Antiphospholipid syndrome: Secondary | ICD-10-CM | POA: Diagnosis not present

## 2019-01-02 LAB — EXPECTORATED SPUTUM ASSESSMENT W GRAM STAIN, RFLX TO RESP C

## 2019-01-02 LAB — CBC
HCT: 36.2 % (ref 36.0–46.0)
Hemoglobin: 11.4 g/dL — ABNORMAL LOW (ref 12.0–15.0)
MCH: 29 pg (ref 26.0–34.0)
MCHC: 31.5 g/dL (ref 30.0–36.0)
MCV: 92.1 fL (ref 80.0–100.0)
Platelets: 407 10*3/uL — ABNORMAL HIGH (ref 150–400)
RBC: 3.93 MIL/uL (ref 3.87–5.11)
RDW: 12.8 % (ref 11.5–15.5)
WBC: 9.2 10*3/uL (ref 4.0–10.5)
nRBC: 0 % (ref 0.0–0.2)

## 2019-01-02 LAB — PROTIME-INR
INR: 2.57
Prothrombin Time: 27.2 seconds — ABNORMAL HIGH (ref 11.4–15.2)

## 2019-01-02 MED ORDER — GUAIFENESIN ER 600 MG PO TB12
1200.0000 mg | ORAL_TABLET | Freq: Two times a day (BID) | ORAL | Status: DC
  Administered 2019-01-02 – 2019-01-06 (×9): 1200 mg via ORAL
  Filled 2019-01-02 (×9): qty 2

## 2019-01-02 MED ORDER — WARFARIN SODIUM 5 MG PO TABS
5.0000 mg | ORAL_TABLET | Freq: Once | ORAL | Status: AC
  Administered 2019-01-02: 5 mg via ORAL
  Filled 2019-01-02: qty 1

## 2019-01-02 MED ORDER — ACETAMINOPHEN 325 MG PO TABS
650.0000 mg | ORAL_TABLET | Freq: Four times a day (QID) | ORAL | Status: DC | PRN
  Administered 2019-01-02 (×2): 650 mg via ORAL
  Filled 2019-01-02 (×3): qty 2

## 2019-01-02 MED ORDER — HYDROCODONE-ACETAMINOPHEN 10-325 MG PO TABS
1.0000 | ORAL_TABLET | ORAL | Status: DC | PRN
  Administered 2019-01-02 – 2019-01-06 (×15): 1 via ORAL
  Filled 2019-01-02 (×17): qty 1

## 2019-01-02 MED ORDER — HYDROCODONE-ACETAMINOPHEN 10-325 MG PO TABS: 0.5000 | ORAL_TABLET | ORAL | Status: DC | PRN

## 2019-01-02 MED ORDER — HYDROCODONE-HOMATROPINE 5-1.5 MG/5ML PO SYRP
5.0000 mL | ORAL_SOLUTION | Freq: Four times a day (QID) | ORAL | Status: DC | PRN
  Administered 2019-01-02 – 2019-01-05 (×5): 5 mL via ORAL
  Filled 2019-01-02 (×6): qty 5

## 2019-01-02 NOTE — Progress Notes (Signed)
Subjective: She says she still feels bad.  She has aching all over.  She is still coughing mostly nonproductively but occasionally she brings up a large amount of sputum.  No chest pain other than associated with her cough  Objective: Vital signs in last 24 hours: Temp:  [98 F (36.7 C)-98.2 F (36.8 C)] 98 F (36.7 C) (01/04 0629) Pulse Rate:  [62-70] 69 (01/04 0629) Resp:  [17-20] 17 (01/04 0629) BP: (115-146)/(65-80) 146/80 (01/04 0629) SpO2:  [92 %-98 %] 93 % (01/04 0804) Weight change:  Last BM Date: 01/01/19  Intake/Output from previous day: 01/03 0701 - 01/04 0700 In: 1880 [P.O.:1080; IV Piggyback:800] Out: -   PHYSICAL EXAM General appearance: alert, cooperative and mild distress Resp: rhonchi bilaterally Cardio: regular rate and rhythm, S1, S2 normal, no murmur, click, rub or gallop GI: soft, non-tender; bowel sounds normal; no masses,  no organomegaly Extremities: extremities normal, atraumatic, no cyanosis or edema  Lab Results:  Results for orders placed or performed during the hospital encounter of 12/31/18 (from the past 48 hour(s))  Urinalysis, Routine w reflex microscopic     Status: Abnormal   Collection Time: 12/31/18  1:15 PM  Result Value Ref Range   Color, Urine COLORLESS (A) YELLOW   APPearance CLEAR CLEAR   Specific Gravity, Urine 1.005 1.005 - 1.030   pH 6.0 5.0 - 8.0   Glucose, UA NEGATIVE NEGATIVE mg/dL   Hgb urine dipstick NEGATIVE NEGATIVE   Bilirubin Urine NEGATIVE NEGATIVE   Ketones, ur NEGATIVE NEGATIVE mg/dL   Protein, ur NEGATIVE NEGATIVE mg/dL   Nitrite NEGATIVE NEGATIVE   Leukocytes, UA NEGATIVE NEGATIVE    Comment: Performed at [facility], [address] [place], [place] [address]  CBC with Differential/Platelet     Status: Abnormal   Collection Time: 12/31/18  1:28 PM  Result Value Ref Range   WBC 14.4 (H) 4.0 - 10.5 K/uL   RBC 4.15 3.87 - 5.11 MIL/uL   Hemoglobin 11.9 (L) 12.0 - 15.0 g/dL   HCT 26.9 48.5 - 46.2 %   MCV 90.6  80.0 - 100.0 fL   MCH 28.7 26.0 - 34.0 pg   MCHC 31.6 30.0 - 36.0 g/dL   RDW 70.3 50.0 - 93.8 %   Platelets 371 150 - 400 K/uL   nRBC 0.0 0.0 - 0.2 %   Neutrophils Relative % 79 %   Neutro Abs 11.3 (H) 1.7 - 7.7 K/uL   Lymphocytes Relative 10 %   Lymphs Abs 1.5 0.7 - 4.0 K/uL   Monocytes Relative 10 %   Monocytes Absolute 1.5 (H) 0.1 - 1.0 K/uL   Eosinophils Relative 0 %   Eosinophils Absolute 0.0 0.0 - 0.5 K/uL   Basophils Relative 0 %   Basophils Absolute 0.0 0.0 - 0.1 K/uL   Immature Granulocytes 1 %   Abs Immature Granulocytes 0.08 (H) 0.00 - 0.07 K/uL    Comment: Performed at [facility], [address], [place], [place] [address]  Basic metabolic panel     Status: Abnormal   Collection Time: 12/31/18  1:28 PM  Result Value Ref Range   Sodium 135 135 - 145 mmol/L   Potassium 3.2 (L) 3.5 - 5.1 mmol/L   Chloride 95 (L) 98 - 111 mmol/L   CO2 31 22 - 32 mmol/L   Glucose, Bld 82 70 - 99 mg/dL   BUN 13 6 - 20 mg/dL   Creatinine, Ser 3.71 0.44 - 1.00 mg/dL   Calcium 8.7 (L) 8.9 -  10.3 mg/dL   GFR calc non Af Amer >60 >60 mL/min   GFR calc Af Amer >60 >60 mL/min   Anion gap 9 5 - 15    Comment: Performed at [facility], [address] [place], [place] [address]  Brain natriuretic peptide     Status: Abnormal   Collection Time: 12/31/18  1:28 PM  Result Value Ref Range   B Natriuretic Peptide 523.0 (H) 0.0 - 100.0 pg/mL    Comment: Performed at [facility], [address] [place], [place] [address]  Protime-INR     Status: Abnormal   Collection Time: 12/31/18  1:28 PM  Result Value Ref Range   Prothrombin Time 27.2 (H) 11.4 - 15.2 seconds   INR 2.56     Comment: Performed at [facility], [address], [place], [place] [address]  Blood Culture (routine x 2)     Status: None (Preliminary result)   Collection Time: 12/31/18  1:28 PM  Result Value Ref Range   Specimen Description BLOOD RIGHT ARM    Special Requests      BOTTLES DRAWN AEROBIC AND ANAEROBIC Blood Culture  adequate volume   Culture      NO GROWTH 2 DAYS Performed at [facility], [address], [place], [place] [address]    Report Status PENDING   Troponin I - Add-On to previous collection     Status: None   Collection Time: 12/31/18  1:28 PM  Result Value Ref Range   Troponin I <0.03 <0.03 ng/mL    Comment: Performed at [facility], [address], [place], [place] [address]  Blood Culture (routine x 2)     Status: None (Preliminary result)   Collection Time: 12/31/18  1:35 PM  Result Value Ref Range   Specimen Description BLOOD LEFT ARM    Special Requests      BOTTLES DRAWN AEROBIC AND ANAEROBIC Blood Culture adequate volume   Culture      NO GROWTH 2 DAYS Performed at [facility], [address] [place], [place] [address]    Report Status PENDING   I-Stat CG4 Lactic Acid, ED     Status: None   Collection Time: 12/31/18  1:44 PM  Result Value Ref Range   Lactic Acid, Venous 1.32 0.5 - 1.9 mmol/L  I-Stat CG4 Lactic Acid, ED     Status: None   Collection Time: 12/31/18  4:46 PM  Result Value Ref Range   Lactic Acid, Venous 1.09 0.5 - 1.9 mmol/L  Troponin I - Now Then Q6H     Status: None   Collection Time: 12/31/18  7:23 PM  Result Value Ref Range   Troponin I <0.03 <0.03 ng/mL    Comment: Performed at [facility], [address], [place], [place] [address]  Troponin I - Now Then Q6H     Status: None   Collection Time: 01/01/19  1:19 AM  Result Value Ref Range   Troponin I <0.03 <0.03 ng/mL    Comment: Performed at [facility], [address], [place], [place] [address]  Protime-INR     Status: Abnormal   Collection Time: 01/01/19  1:19 AM  Result Value Ref Range   Prothrombin Time 34.9 (H) 11.4 - 15.2 seconds   INR 3.54     Comment: Performed at [facility], [name], [place], [place] [address]  CBC     Status: Abnormal   Collection Time: 01/01/19  1:19 AM  Result Value Ref Range  WBC 9.4 4.0 - 10.5 K/uL   RBC 3.83 (L) 3.87 - 5.11 MIL/uL   Hemoglobin 10.8  (L) 12.0 - 15.0 g/dL   HCT 18.3 (L) 35.8 - 25.1 %   MCV 90.9 80.0 - 100.0 fL   MCH 28.2 26.0 - 34.0 pg   MCHC 31.0 30.0 - 36.0 g/dL   RDW 89.8 42.1 - 03.1 %   Platelets 369 150 - 400 K/uL   nRBC 0.0 0.0 - 0.2 %    Comment: Performed at [facility], [address] [place], [place] [address]  Basic metabolic panel     Status: Abnormal   Collection Time: 01/01/19  1:19 AM  Result Value Ref Range   Sodium 137 135 - 145 mmol/L   Potassium 4.1 3.5 - 5.1 mmol/L    Comment: DELTA CHECK NOTED   Chloride 101 98 - 111 mmol/L   CO2 30 22 - 32 mmol/L   Glucose, Bld 113 (H) 70 - 99 mg/dL   BUN 13 6 - 20 mg/dL   Creatinine, Ser 8.67 0.44 - 1.00 mg/dL   Calcium 8.2 (L) 8.9 - 10.3 mg/dL   GFR calc non Af Amer >60 >60 mL/min   GFR calc Af Amer >60 >60 mL/min   Anion gap 6 5 - 15    Comment: Performed at [facility], [address], [place], [place] [address]  Magnesium     Status: None   Collection Time: 01/01/19  1:19 AM  Result Value Ref Range   Magnesium 2.0 1.7 - 2.4 mg/dL    Comment: Performed at [facility], [address], [place], [place] [address]  Protime-INR     Status: Abnormal   Collection Time: 01/02/19  6:08 AM  Result Value Ref Range   Prothrombin Time 27.2 (H) 11.4 - 15.2 seconds   INR 2.57     Comment: Performed at [facility], [address], [place], [place] [address]  CBC     Status: Abnormal   Collection Time: 01/02/19  6:08 AM  Result Value Ref Range   WBC 9.2 4.0 - 10.5 K/uL   RBC 3.93 3.87 - 5.11 MIL/uL   Hemoglobin 11.4 (L) 12.0 - 15.0 g/dL   HCT 15.1 83.4 - 37.3 %   MCV 92.1 80.0 - 100.0 fL   MCH 29.0 26.0 - 34.0 pg   MCHC 31.5 30.0 - 36.0 g/dL   RDW 57.8 97.8 - 47.8 %   Platelets 407 (H) 150 - 400 K/uL   nRBC 0.0 0.0 - 0.2 %    Comment: Performed at [facility], [address], [place], [place] [address]    ABGS No results for input(s): PHART, PO2ART, TCO2, HCO3 in the last 72 hours.  Invalid input(s): PCO2 CULTURES Recent Results (from the past 240  hour(s))  Culture, blood (routine x 2)     Status: None   Collection Time: 12/23/18 11:35 PM  Result Value Ref Range Status   Specimen Description BLOOD RIGHT ANTECUBITAL  Final   Special Requests   Final    BOTTLES DRAWN AEROBIC AND ANAEROBIC Blood Culture adequate volume   Culture   Final    NO GROWTH 5 DAYS Performed at [facility], [address], [place], [place] [address]    Report Status 12/28/2018 FINAL  Final  Culture, blood (routine x 2)     Status: None   Collection Time: 12/23/18 11:50 PM  Result Value Ref Range Status   Specimen Description BLOOD LEFT ANTECUBITAL  Final  Special Requests   Final    BOTTLES DRAWN AEROBIC AND ANAEROBIC Blood Culture adequate volume   Culture   Final    NO GROWTH 5 DAYS Performed at [facility], [address], [place], [place] [address]    Report Status 12/28/2018 FINAL  Final  Blood Culture (routine x 2)     Status: None (Preliminary result)   Collection Time: 12/31/18  1:28 PM  Result Value Ref Range Status   Specimen Description BLOOD RIGHT ARM  Final   Special Requests   Final    BOTTLES DRAWN AEROBIC AND ANAEROBIC Blood Culture adequate volume   Culture   Final    NO GROWTH 2 DAYS Performed at [facility], [address], [place], [place] [address]    Report Status PENDING  Incomplete  Blood Culture (routine x 2)     Status: None (Preliminary result)   Collection Time: 12/31/18  1:35 PM  Result Value Ref Range Status   Specimen Description BLOOD LEFT ARM  Final   Special Requests   Final    BOTTLES DRAWN AEROBIC AND ANAEROBIC Blood Culture adequate volume   Culture   Final    NO GROWTH 2 DAYS Performed at [facility], [address] [place], [place] [address]    Report Status PENDING  Incomplete   Studies/Results: Dg Chest 2 View  Result Date: 12/31/2018 CLINICAL DATA:  Congestion, shortness of Breath EXAM: CHEST - 2 VIEW COMPARISON:  12/23/2018 FINDINGS: Left AICD remains in place, unchanged. Heart is borderline in  size. Perihilar and lower lobe opacities and interstitial prominence could reflect early interstitial edema. No effusions or acute bony abnormality. IMPRESSION: Bilateral mid and lower lung interstitial prominence could reflect early interstitial edema. Electronically Signed   By: Charlett Nose M.D.   On: 12/31/2018 12:44   Ct Chest Wo Contrast  Result Date: 12/31/2018 CLINICAL DATA:  Cough.  Dyspnea.  Recent diagnosis of influenza. EXAM: CT CHEST WITHOUT CONTRAST TECHNIQUE: Multidetector CT imaging of the chest was performed following the standard protocol without IV contrast. COMPARISON:  Chest radiograph from earlier today. FINDINGS: Cardiovascular: Mild cardiomegaly. No significant pericardial effusion/thickening. Three-vessel coronary atherosclerosis. Two lead left subclavian ICD is noted with lead tips in the right atrium and right ventricular apex. Atherosclerotic nonaneurysmal thoracic aorta. Top-normal caliber main pulmonary artery (3.1 cm diameter). Mediastinum/Nodes: No discrete thyroid nodules. Unremarkable esophagus. No axillary adenopathy. Mild right paratracheal adenopathy measuring up to 1.3 cm (series 2/image 53). Enlarged 1.3 cm subcarinal node (series 2/image 67). Subjective mild bilateral hilar adenopathy, poorly delineated on this noncontrast scan. Lungs/Pleura: No pneumothorax. No pleural effusion. Extensive patchy consolidation and ground-glass opacity throughout the bilateral basilar lower lobes. Less prominent patchy peribronchovascular consolidation and ground-glass opacity throughout the remaining lungs involving all lung lobes. No discrete lung masses. No significant regions of bronchiectasis. Upper abdomen: Small hiatal hernia. Musculoskeletal: No aggressive appearing focal osseous lesions. Mild thoracic spondylosis. Partially visualized left lateral spinal fusion hardware in the upper lumbar spine. IMPRESSION: 1. Extensive patchy consolidation and ground-glass opacity in the dependent  lower lobes bilaterally, with less prominent patchy peribronchovascular consolidation and ground-glass opacity throughout the remaining lungs. Favor multilobar infectious pneumonia. Suggest follow-up chest imaging in 1-2 months to document resolution. 2. Mild cardiomegaly. 3. Mild mediastinal and bilateral hilar adenopathy, nonspecific, most likely reactive. 4. Three-vessel coronary atherosclerosis. Aortic Atherosclerosis (ICD10-I70.0). Electronically Signed   By: Delbert Phenix M.D.   On: 12/31/2018 15:08    Medications:  Prior to Admission:  Medications Prior  to Admission  Medication Sig Dispense Refill Last Dose  . carvedilol (COREG) 12.5 MG tablet TAKE 1 TABLET BY MOUTH TWICE DAILY WITH MEALS 180 tablet 3 12/31/2018 at 1000  . clonazePAM (KLONOPIN) 1 MG tablet Take 1 mg by mouth 4 (four) times daily as needed for anxiety.   12/31/2018 at Unknown time  . doxycycline (VIBRAMYCIN) 100 MG capsule Take 1 capsule (100 mg total) by mouth 2 (two) times daily. 10 capsule 0 12/31/2018 at Unknown time  . DULoxetine (CYMBALTA) 60 MG capsule Take 60 mg by mouth every morning.   12/31/2018 at Unknown time  . furosemide (LASIX) 40 MG tablet TAKE 1 TABLET BY MOUTH DAILY AS NEEDED FOR SWELLING. 30 tablet 6   . furosemide (LASIX) 80 MG tablet TAKE 1 TABLET BY MOUTH DAILY 90 tablet 1 12/31/2018 at Unknown time  . gabapentin (NEURONTIN) 300 MG capsule Take 300 mg by mouth 2 (two) times daily.   12/31/2018 at Unknown time  . HYDROcodone-acetaminophen (NORCO) 10-325 MG per tablet Take 0.5 tablets by mouth 4 (four) times daily as needed for moderate pain or severe pain. pain   12/31/2018 at Unknown time  . isosorbide mononitrate (IMDUR) 30 MG 24 hr tablet TAKE 1 TABLET BY MOUTH DAILY 90 tablet 1 12/31/2018 at Unknown time  . losartan (COZAAR) 25 MG tablet TAKE 1/2 TABLET BY MOUTH EVERY DAY 45 tablet 1 12/31/2018 at Unknown time  . Multiple Vitamins-Minerals (MULTIVITAMINS THER. W/MINERALS) TABS Take 1 tablet by mouth daily.     12/31/2018  at Unknown time  . nicotine (NICODERM CQ - DOSED IN MG/24 HR) 7 mg/24hr patch Place 1 patch (7 mg total) onto the skin daily. 28 patch 0 12/31/2018 at Unknown time  . nitroGLYCERIN (NITROSTAT) 0.4 MG SL tablet PLACE 1 TABLET UNDER THE TONGUE EVERY 5 MINUTES FOR 3 DOSES AS NEEDED CHEST PAIN 25 tablet 3 Taking  . oseltamivir (TAMIFLU) 75 MG capsule Take 1 capsule (75 mg total) by mouth 2 (two) times daily. 6 capsule 0 12/31/2018 at Unknown time  . potassium chloride SA (K-DUR,KLOR-CON) 20 MEQ tablet TAKE 1 TABLET BY MOUTH EVERY DAY 90 tablet 3 12/31/2018 at Unknown time  . promethazine (PHENERGAN) 25 MG tablet Take 25 mg by mouth every 6 (six) hours as needed for nausea.   12/31/2018 at Unknown time  . ranitidine (ZANTAC) 150 MG tablet Take 150 mg by mouth 2 (two) times daily.   12/31/2018 at Unknown time  . spironolactone (ALDACTONE) 25 MG tablet TAKE 1 TABLET BY MOUTH EVERY DAY 90 tablet 1 12/31/2018 at Unknown time  . warfarin (COUMADIN) 10 MG tablet Take 1 tablet daily except 1/2 tablet on Thursdays (Patient taking differently: Take 5-10 mg by mouth See admin instructions. TAKES 5MG  ON TUESDAYS AND THURSDAYS AND TAKES 10MG  ON ALL OTHER DAYS) 30 tablet 3 12/31/2018 at 1000   Scheduled: . DULoxetine  60 mg Oral q morning - 10a  . gabapentin  300 mg Oral BID  . guaiFENesin  1,200 mg Oral BID  . ipratropium-albuterol  3 mL Nebulization TID  . nicotine  21 mg Transdermal Daily  . potassium chloride SA  20 mEq Oral Daily  . spironolactone  25 mg Oral Daily  . Warfarin - Pharmacist Dosing Inpatient   Does not apply q1800   Continuous: . ceFEPime (MAXIPIME) IV Stopped (01/02/19 0249)  . vancomycin 1,000 mg (01/02/19 0900)   ZOX:[id number], clonazePAM, HYDROcodone-acetaminophen, HYDROcodone-homatropine, ipratropium-albuterol, ondansetron **OR** ondansetron (ZOFRAN) IV  Assesment: She was admitted with multifocal pneumonia  following influenza.  She is being treated for healthcare associated pneumonia and I  think she is a little bit better.  She has ischemic cardiomyopathy at baseline.  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  She is on a nicotine patch Active Problems:   Hyperlipidemia   Essential hypertension, benign   Cardiomyopathy, ischemic   CEREBROVASCULAR DISEASE   Peripheral vascular disease (HCC)   Anticardiolipin syndrome (HCC)   Long term current use of anticoagulant   Dual implantable cardioverter-defibrillator in situ   Chronic combined systolic (congestive) and diastolic (congestive) heart failure (HCC)   Pneumonia    Plan: Continue treatments.  Continue antibiotics flutter valve    LOS: 2 days   Fredirick Maudlindward L Clarnce Homan 01/02/2019, 10:52 AM

## 2019-01-02 NOTE — Progress Notes (Signed)
Patient c/o of headache requesting tylenol. Dr Robb Matar paged  Via amion and request made. Order for po tylenol initiated by provider

## 2019-01-03 ENCOUNTER — Other Ambulatory Visit: Payer: Self-pay | Admitting: Cardiology

## 2019-01-03 DIAGNOSIS — Z79899 Other long term (current) drug therapy: Secondary | ICD-10-CM | POA: Diagnosis not present

## 2019-01-03 DIAGNOSIS — J9601 Acute respiratory failure with hypoxia: Secondary | ICD-10-CM | POA: Diagnosis not present

## 2019-01-03 DIAGNOSIS — I5042 Chronic combined systolic (congestive) and diastolic (congestive) heart failure: Secondary | ICD-10-CM | POA: Diagnosis not present

## 2019-01-03 DIAGNOSIS — J189 Pneumonia, unspecified organism: Secondary | ICD-10-CM | POA: Diagnosis not present

## 2019-01-03 DIAGNOSIS — J440 Chronic obstructive pulmonary disease with acute lower respiratory infection: Secondary | ICD-10-CM | POA: Diagnosis not present

## 2019-01-03 DIAGNOSIS — B002 Herpesviral gingivostomatitis and pharyngotonsillitis: Secondary | ICD-10-CM | POA: Diagnosis not present

## 2019-01-03 DIAGNOSIS — F17290 Nicotine dependence, other tobacco product, uncomplicated: Secondary | ICD-10-CM | POA: Diagnosis not present

## 2019-01-03 DIAGNOSIS — Z7901 Long term (current) use of anticoagulants: Secondary | ICD-10-CM | POA: Diagnosis not present

## 2019-01-03 DIAGNOSIS — D6861 Antiphospholipid syndrome: Secondary | ICD-10-CM | POA: Diagnosis not present

## 2019-01-03 LAB — CBC
HCT: 39.2 % (ref 36.0–46.0)
Hemoglobin: 11.9 g/dL — ABNORMAL LOW (ref 12.0–15.0)
MCH: 28.2 pg (ref 26.0–34.0)
MCHC: 30.4 g/dL (ref 30.0–36.0)
MCV: 92.9 fL (ref 80.0–100.0)
Platelets: 435 10*3/uL — ABNORMAL HIGH (ref 150–400)
RBC: 4.22 MIL/uL (ref 3.87–5.11)
RDW: 13 % (ref 11.5–15.5)
WBC: 8.3 10*3/uL (ref 4.0–10.5)
nRBC: 0 % (ref 0.0–0.2)

## 2019-01-03 LAB — PROTIME-INR
INR: 2.5
Prothrombin Time: 26.7 seconds — ABNORMAL HIGH (ref 11.4–15.2)

## 2019-01-03 MED ORDER — WARFARIN SODIUM 5 MG PO TABS
5.0000 mg | ORAL_TABLET | Freq: Once | ORAL | Status: DC
  Filled 2019-01-03: qty 1

## 2019-01-03 NOTE — Progress Notes (Signed)
Subjective: She says she feels better today.  No new complaints.  Her breathing is doing better.  She is coughed up some sputum.  She is off oxygen now.  No chest pain.  No swelling of the extremities  Objective: Vital signs in last 24 hours: Temp:  [97.7 F (36.5 C)-98 F (36.7 C)] 98 F (36.7 C) (01/05 0557) Pulse Rate:  [72-88] 76 (01/05 0557) Resp:  [18] 18 (01/05 0557) BP: (128-133)/(65-95) 131/65 (01/05 0557) SpO2:  [95 %-100 %] 100 % (01/05 0808) Weight change:  Last BM Date: 01/01/19  Intake/Output from previous day: 01/04 0701 - 01/05 0700 In: 1490 [P.O.:840; IV Piggyback:650] Out: -   PHYSICAL EXAM General appearance: alert, cooperative and mild distress Resp: rhonchi bilaterally Cardio: regular rate and rhythm, S1, S2 normal, no murmur, click, rub or gallop GI: soft, non-tender; bowel sounds normal; no masses,  no organomegaly Extremities: extremities normal, atraumatic, no cyanosis or edema  Lab Results:  Results for orders placed or performed during the hospital encounter of 12/31/18 (from the past 48 hour(s))  Protime-INR     Status: Abnormal   Collection Time: 01/02/19  6:08 AM  Result Value Ref Range   Prothrombin Time 27.2 (H) 11.4 - 15.2 seconds   INR 2.57     Comment: Performed at [facility], [address] [place], [place] [address]  CBC     Status: Abnormal   Collection Time: 01/02/19  6:08 AM  Result Value Ref Range   WBC 9.2 4.0 - 10.5 K/uL   RBC 3.93 3.87 - 5.11 MIL/uL   Hemoglobin 11.4 (L) 12.0 - 15.0 g/dL   HCT 60.436.2 54.036.0 - 98.146.0 %   MCV 92.1 80.0 - 100.0 fL   MCH 29.0 26.0 - 34.0 pg   MCHC 31.5 30.0 - 36.0 g/dL   RDW 19.112.8 47.811.5 - 29.515.5 %   Platelets 407 (H) 150 - 400 K/uL   nRBC 0.0 0.0 - 0.2 %    Comment: Performed at [facility], [address] [place], [place] [address]  Expectorated sputum assessment w rflx to resp cult     Status: None   Collection Time: 01/02/19  7:19 PM  Result Value Ref Range   Specimen Description EXPECTORATED  SPUTUM    Special Requests NONE    Sputum evaluation      THIS SPECIMEN IS ACCEPTABLE FOR SPUTUM CULTURE Performed at [facility], [address] [place], [place] [address]    Report Status 01/02/2019 FINAL   Protime-INR     Status: Abnormal   Collection Time: 01/03/19  6:24 AM  Result Value Ref Range   Prothrombin Time 26.7 (H) 11.4 - 15.2 seconds   INR 2.50     Comment: Performed at [facility], [address] [place], [place] [address]  CBC     Status: Abnormal   Collection Time: 01/03/19  6:24 AM  Result Value Ref Range   WBC 8.3 4.0 - 10.5 K/uL   RBC 4.22 3.87 - 5.11 MIL/uL   Hemoglobin 11.9 (L) 12.0 - 15.0 g/dL   HCT 95.239.2 84.136.0 - 32.446.0 %   MCV 92.9 80.0 - 100.0 fL   MCH 28.2 26.0 - 34.0 pg   MCHC 30.4 30.0 - 36.0 g/dL   RDW 40.113.0 02.711.5 - 25.315.5 %   Platelets 435 (H) 150 - 400 K/uL   nRBC 0.0 0.0 - 0.2 %    Comment: Performed at [facility], [address] [place], [place] [address]  ABGS No results for input(s): PHART, PO2ART, TCO2, HCO3 in the last 72 hours.  Invalid input(s): PCO2 CULTURES Recent Results (from the past 240 hour(s))  Blood Culture (routine x 2)     Status: None (Preliminary result)   Collection Time: 12/31/18  1:28 PM  Result Value Ref Range Status   Specimen Description BLOOD RIGHT ARM  Final   Special Requests   Final    BOTTLES DRAWN AEROBIC AND ANAEROBIC Blood Culture adequate volume   Culture   Final    NO GROWTH 2 DAYS Performed at [facility], [address], [place], [place] [address]    Report Status PENDING  Incomplete  Blood Culture (routine x 2)     Status: None (Preliminary result)   Collection Time: 12/31/18  1:35 PM  Result Value Ref Range Status   Specimen Description BLOOD LEFT ARM  Final   Special Requests   Final    BOTTLES DRAWN AEROBIC AND ANAEROBIC Blood Culture adequate volume   Culture   Final    NO GROWTH 2 DAYS Performed at [facility], [address], [place], [place] [address]    Report Status PENDING   Incomplete  Expectorated sputum assessment w rflx to resp cult     Status: None   Collection Time: 01/02/19  7:19 PM  Result Value Ref Range Status   Specimen Description EXPECTORATED SPUTUM  Final   Special Requests NONE  Final   Sputum evaluation   Final    THIS SPECIMEN IS ACCEPTABLE FOR SPUTUM CULTURE Performed at [facility], [address], [place], [place] [address]    Report Status 01/02/2019 FINAL  Final   Studies/Results: No results found.  Medications:  Prior to Admission:  Medications Prior to Admission  Medication Sig Dispense Refill Last Dose  . carvedilol (COREG) 12.5 MG tablet TAKE 1 TABLET BY MOUTH TWICE DAILY WITH MEALS 180 tablet 3 12/31/2018 at 1000  . clonazePAM (KLONOPIN) 1 MG tablet Take 1 mg by mouth 4 (four) times daily as needed for anxiety.   12/31/2018 at Unknown time  . doxycycline (VIBRAMYCIN) 100 MG capsule Take 1 capsule (100 mg total) by mouth 2 (two) times daily. 10 capsule 0 12/31/2018 at Unknown time  . DULoxetine (CYMBALTA) 60 MG capsule Take 60 mg by mouth every morning.   12/31/2018 at Unknown time  . furosemide (LASIX) 40 MG tablet TAKE 1 TABLET BY MOUTH DAILY AS NEEDED FOR SWELLING. 30 tablet 6   . furosemide (LASIX) 80 MG tablet TAKE 1 TABLET BY MOUTH DAILY 90 tablet 1 12/31/2018 at Unknown time  . gabapentin (NEURONTIN) 300 MG capsule Take 300 mg by mouth 2 (two) times daily.   12/31/2018 at Unknown time  . HYDROcodone-acetaminophen (NORCO) 10-325 MG per tablet Take 0.5 tablets by mouth 4 (four) times daily as needed for moderate pain or severe pain. pain   12/31/2018 at Unknown time  . isosorbide mononitrate (IMDUR) 30 MG 24 hr tablet TAKE 1 TABLET BY MOUTH DAILY 90 tablet 1 12/31/2018 at Unknown time  . losartan (COZAAR) 25 MG tablet TAKE 1/2 TABLET BY MOUTH EVERY DAY 45 tablet 1 12/31/2018 at Unknown time  . Multiple Vitamins-Minerals (MULTIVITAMINS THER. W/MINERALS) TABS Take 1 tablet by mouth daily.     12/31/2018 at Unknown time  . nicotine (NICODERM CQ -  DOSED IN MG/24 HR) 7 mg/24hr patch Place 1 patch (7 mg total) onto the skin daily. 28 patch 0 12/31/2018 at Unknown time  . nitroGLYCERIN (NITROSTAT) 0.4 MG  SL tablet PLACE 1 TABLET UNDER THE TONGUE EVERY 5 MINUTES FOR 3 DOSES AS NEEDED CHEST PAIN 25 tablet 3 Taking  . oseltamivir (TAMIFLU) 75 MG capsule Take 1 capsule (75 mg total) by mouth 2 (two) times daily. 6 capsule 0 12/31/2018 at Unknown time  . potassium chloride SA (K-DUR,KLOR-CON) 20 MEQ tablet TAKE 1 TABLET BY MOUTH EVERY DAY 90 tablet 3 12/31/2018 at Unknown time  . promethazine (PHENERGAN) 25 MG tablet Take 25 mg by mouth every 6 (six) hours as needed for nausea.   12/31/2018 at Unknown time  . ranitidine (ZANTAC) 150 MG tablet Take 150 mg by mouth 2 (two) times daily.   12/31/2018 at Unknown time  . spironolactone (ALDACTONE) 25 MG tablet TAKE 1 TABLET BY MOUTH EVERY DAY 90 tablet 1 12/31/2018 at Unknown time  . warfarin (COUMADIN) 10 MG tablet Take 1 tablet daily except 1/2 tablet on Thursdays (Patient taking differently: Take 5-10 mg by mouth See admin instructions. TAKES 5MG  ON TUESDAYS AND THURSDAYS AND TAKES 10MG  ON ALL OTHER DAYS) 30 tablet 3 12/31/2018 at 1000   Scheduled: . DULoxetine  60 mg Oral q morning - 10a  . gabapentin  300 mg Oral BID  . guaiFENesin  1,200 mg Oral BID  . ipratropium-albuterol  3 mL Nebulization TID  . nicotine  21 mg Transdermal Daily  . potassium chloride SA  20 mEq Oral Daily  . spironolactone  25 mg Oral Daily  . warfarin  5 mg Oral Once  . Warfarin - Pharmacist Dosing Inpatient   Does not apply q1800   Continuous: . ceFEPime (MAXIPIME) IV 2 g (01/03/19 0227)  . vancomycin 1,000 mg (01/03/19 0900)   HYQ:[id number], clonazePAM, HYDROcodone-acetaminophen, HYDROcodone-homatropine, ipratropium-albuterol, ondansetron **OR** ondansetron (ZOFRAN) IV  Assesment: She was admitted with pneumonia following influenza.  She does seem to be improving.  She is on appropriate treatment for healthcare associated  pneumonia.  Pneumonia following influenza is frequently staph but we do not have culture results back yet.  She has coughed up some sputum.  She is hypercoagulable and on chronic Coumadin therapy and pharmacy is managing her Coumadin  She has chronic combined systolic and diastolic heart failure and seems euvolemic now  She has ischemic cardiomyopathy but no chest pain  She has hypertension which is controlled  She had thrombocytopenia and hyponatremia last hospitalization felt to be related to influenza and these have resolved Active Problems:   Hyperlipidemia   Essential hypertension, benign   Cardiomyopathy, ischemic   CEREBROVASCULAR DISEASE   Peripheral vascular disease (HCC)   Anticardiolipin syndrome (HCC)   Long term current use of anticoagulant   Dual implantable cardioverter-defibrillator in situ   Chronic combined systolic (congestive) and diastolic (congestive) heart failure (HCC)   Pneumonia    Plan: Continue current treatments.  Continue IV antibiotics.    LOS: 3 days   Fredirick Maudlin 01/03/2019, 10:43 AM

## 2019-01-03 NOTE — Progress Notes (Signed)
ANTICOAGULATION CONSULT NOTE - Follow Up Consult  Pharmacy Consult for warfarin Indication: Hypercoagulable state with anti-cardiolipin antibody  Home dose: warfarin 5mg  po Tuesday and Thursdays and  warfarin 10mg  po all other days    Patient Measurements: Height: 5\' 6"  (167.6 cm) Weight: 224 lb (101.6 kg) IBW/kg (Calculated) : 59.3  Temp: 98 F (36.7 C) (01/05 0557) Temp Source: Oral (01/05 0557) BP: 131/65 (01/05 0557) Pulse Rate: 76 (01/05 0557)  Labs: Recent Labs    12/31/18 1328 12/31/18 1923 01/01/19 0119 01/02/19 0608 01/03/19 0624  HGB 11.9*  --  10.8* 11.4* 11.9*  HCT 37.6  --  34.8* 36.2 39.2  PLT 371  --  369 407* 435*  LABPROT 27.2*  --  34.9* 27.2* 26.7*  INR 2.56  --  3.54 2.57 2.50  CREATININE 0.65  --  0.63  --   --   TROPONINI <0.03 <0.03 <0.03  --   --     Estimated Creatinine Clearance: 95.6 mL/min (by C-G formula based on SCr of 0.63 mg/dL).   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 INR is back in range today, so will  repeat half of usual home dose today in light of recent INR bump.     Goal of Therapy:  INR 2-3 Monitor platelets by anticoagulation protocol: Yes   Plan:  Repeat warfarin 5mg  x1 dose today Daily INR  Monitor for S&S of bleeding   Tama High 01/03/2019,10:14 AM

## 2019-01-04 DIAGNOSIS — J440 Chronic obstructive pulmonary disease with acute lower respiratory infection: Secondary | ICD-10-CM | POA: Diagnosis not present

## 2019-01-04 DIAGNOSIS — D6861 Antiphospholipid syndrome: Secondary | ICD-10-CM | POA: Diagnosis not present

## 2019-01-04 DIAGNOSIS — J9601 Acute respiratory failure with hypoxia: Secondary | ICD-10-CM | POA: Diagnosis not present

## 2019-01-04 DIAGNOSIS — Z79899 Other long term (current) drug therapy: Secondary | ICD-10-CM | POA: Diagnosis not present

## 2019-01-04 DIAGNOSIS — F17290 Nicotine dependence, other tobacco product, uncomplicated: Secondary | ICD-10-CM | POA: Diagnosis not present

## 2019-01-04 DIAGNOSIS — B002 Herpesviral gingivostomatitis and pharyngotonsillitis: Secondary | ICD-10-CM | POA: Diagnosis not present

## 2019-01-04 DIAGNOSIS — Z7901 Long term (current) use of anticoagulants: Secondary | ICD-10-CM | POA: Diagnosis not present

## 2019-01-04 DIAGNOSIS — I5042 Chronic combined systolic (congestive) and diastolic (congestive) heart failure: Secondary | ICD-10-CM | POA: Diagnosis not present

## 2019-01-04 DIAGNOSIS — J189 Pneumonia, unspecified organism: Secondary | ICD-10-CM | POA: Diagnosis not present

## 2019-01-04 LAB — BASIC METABOLIC PANEL
Anion gap: 8 (ref 5–15)
BUN: 12 mg/dL (ref 6–20)
CO2: 28 mmol/L (ref 22–32)
Calcium: 8.9 mg/dL (ref 8.9–10.3)
Chloride: 101 mmol/L (ref 98–111)
Creatinine, Ser: 0.59 mg/dL (ref 0.44–1.00)
GFR calc Af Amer: >60 mL/min (ref >60)
GFR calc non Af Amer: >60 mL/min (ref >60)
Glucose, Bld: 90 mg/dL (ref 70–99)
Potassium: 4.6 mmol/L (ref 3.5–5.1)
Sodium: 137 mmol/L (ref 135–145)

## 2019-01-04 LAB — CBC
HCT: 43.2 % (ref 36.0–46.0)
Hemoglobin: 13.2 g/dL (ref 12.0–15.0)
MCH: 28.7 pg (ref 26.0–34.0)
MCHC: 30.6 g/dL (ref 30.0–36.0)
MCV: 93.9 fL (ref 80.0–100.0)
Platelets: 398 10*3/uL (ref 150–400)
RBC: 4.6 MIL/uL (ref 3.87–5.11)
RDW: 12.9 % (ref 11.5–15.5)
WBC: 8.6 10*3/uL (ref 4.0–10.5)
nRBC: 0 % (ref 0.0–0.2)

## 2019-01-04 LAB — PROTIME-INR
INR: 2.15
Prothrombin Time: 23.7 seconds — ABNORMAL HIGH (ref 11.4–15.2)

## 2019-01-04 LAB — VANCOMYCIN, TROUGH: Vancomycin Tr: 16 ug/mL (ref 15–20)

## 2019-01-04 MED ORDER — ACYCLOVIR 5 % EX CREA
TOPICAL_CREAM | CUTANEOUS | Status: DC
  Administered 2019-01-04 – 2019-01-06 (×14): via TOPICAL
  Filled 2019-01-04: qty 15

## 2019-01-04 MED ORDER — MAGIC MOUTHWASH
5.0000 mL | Freq: Three times a day (TID) | ORAL | Status: DC
  Administered 2019-01-04 – 2019-01-06 (×9): 5 mL via ORAL
  Filled 2019-01-04 (×9): qty 5

## 2019-01-04 MED ORDER — WARFARIN SODIUM 7.5 MG PO TABS
7.5000 mg | ORAL_TABLET | Freq: Once | ORAL | Status: AC
  Administered 2019-01-04: 7.5 mg via ORAL
  Filled 2019-01-04: qty 1

## 2019-01-04 NOTE — Progress Notes (Signed)
Subjective: She says she feels fairly well.  Her breathing is doing better.  She is still coughing up some sputum.  She complains that her mouth is sore and she is got some cracked areas in the corners of her lips.  Her oxygenation is good.  Objective: Vital signs in last 24 hours: Temp:  [97.8 F (36.6 C)-98 F (36.7 C)] 98 F (36.7 C) (01/06 0547) Pulse Rate:  [71-83] 71 (01/06 0618) Resp:  [18] 18 (01/06 0618) BP: (113-126)/(66-68) 126/66 (01/06 0547) SpO2:  [93 %-98 %] 93 % (01/06 0618) Weight change:  Last BM Date: 01/01/19  Intake/Output from previous day: 01/05 0701 - 01/06 0700 In: 720 [P.O.:720] Out: -   PHYSICAL EXAM General appearance: alert, cooperative and no distress Resp: rhonchi bilaterally Cardio: regular rate and rhythm, S1, S2 normal, no murmur, click, rub or gallop GI: soft, non-tender; bowel sounds normal; no masses,  no organomegaly Extremities: extremities normal, atraumatic, no cyanosis or edema  Lab Results:  Results for orders placed or performed during the hospital encounter of 12/31/18 (from the past 48 hour(s))  Expectorated sputum assessment w rflx to resp cult     Status: None   Collection Time: 01/02/19  7:19 PM  Result Value Ref Range   Specimen Description EXPECTORATED SPUTUM    Special Requests NONE    Sputum evaluation      THIS SPECIMEN IS ACCEPTABLE FOR SPUTUM CULTURE Performed at [facility], [address] [place], [place] [address]    Report Status 01/02/2019 FINAL   Culture, respiratory     Status: None (Preliminary result)   Collection Time: 01/02/19  7:19 PM  Result Value Ref Range   Specimen Description      EXPECTORATED SPUTUM Performed at [facility], [address] [place], [place] [address]    Special Requests      NONE Reflexed from [id number] Performed at [facility], [address] [place], [place] [address]    Gram Stain      FEW WBC PRESENT, PREDOMINANTLY PMN FEW GRAM POSITIVE COCCI FEW GRAM POSITIVE  RODS Performed at [facility], [address] [place], [place] [address]    Culture PENDING    Report Status PENDING   Protime-INR     Status: Abnormal   Collection Time: 01/03/19  6:24 AM  Result Value Ref Range   Prothrombin Time 26.7 (H) 11.4 - 15.2 seconds   INR 2.50     Comment: Performed at [facility], [address] [place], [place] [address]  CBC     Status: Abnormal   Collection Time: 01/03/19  6:24 AM  Result Value Ref Range   WBC 8.3 4.0 - 10.5 K/uL   RBC 4.22 3.87 - 5.11 MIL/uL   Hemoglobin 11.9 (L) 12.0 - 15.0 g/dL   HCT 53.639.2 64.436.0 - 03.446.0 %   MCV 92.9 80.0 - 100.0 fL   MCH 28.2 26.0 - 34.0 pg   MCHC 30.4 30.0 - 36.0 g/dL   RDW 74.213.0 59.511.5 - 63.815.5 %   Platelets 435 (H) 150 - 400 K/uL   nRBC 0.0 0.0 - 0.2 %    Comment: Performed at [facility], [address] [place], [place] [address]  Protime-INR     Status: Abnormal   Collection Time: 01/04/19  6:21 AM  Result Value Ref Range   Prothrombin Time 23.7 (H) 11.4 - 15.2 seconds   INR 2.15     Comment: Performed at [facility], [address]  [place], [place] [address]  CBC     Status: None   Collection Time: 01/04/19  6:21 AM  Result Value Ref Range   WBC 8.6 4.0 - 10.5 K/uL   RBC 4.60 3.87 - 5.11 MIL/uL   Hemoglobin 13.2 12.0 - 15.0 g/dL   HCT 33.8 25.0 - 53.9 %   MCV 93.9 80.0 - 100.0 fL   MCH 28.7 26.0 - 34.0 pg   MCHC 30.6 30.0 - 36.0 g/dL   RDW 76.7 34.1 - 93.7 %   Platelets 398 150 - 400 K/uL   nRBC 0.0 0.0 - 0.2 %    Comment: Performed at [facility], [address] [place], [place] [address]  Basic metabolic panel     Status: None   Collection Time: 01/04/19  6:21 AM  Result Value Ref Range   Sodium 137 135 - 145 mmol/L   Potassium 4.6 3.5 - 5.1 mmol/L   Chloride 101 98 - 111 mmol/L   CO2 28 22 - 32 mmol/L   Glucose, Bld 90 70 - 99 mg/dL   BUN 12 6 - 20 mg/dL   Creatinine, Ser 9.73 0.44 - 1.00 mg/dL   Calcium 8.9 8.9 - 53.2 mg/dL   GFR calc non Af Amer >60 >60 mL/min   GFR calc Af  Amer >60 >60 mL/min   Anion gap 8 5 - 15    Comment: Performed at [facility], [address] [place], [place] [address]    ABGS No results for input(s): PHART, PO2ART, TCO2, HCO3 in the last 72 hours.  Invalid input(s): PCO2 CULTURES Recent Results (from the past 240 hour(s))  Blood Culture (routine x 2)     Status: None (Preliminary result)   Collection Time: 12/31/18  1:28 PM  Result Value Ref Range Status   Specimen Description BLOOD RIGHT ARM  Final   Special Requests   Final    BOTTLES DRAWN AEROBIC AND ANAEROBIC Blood Culture adequate volume   Culture   Final    NO GROWTH 4 DAYS Performed at [facility], [address], [place], [place] [address]    Report Status PENDING  Incomplete  Blood Culture (routine x 2)     Status: None (Preliminary result)   Collection Time: 12/31/18  1:35 PM  Result Value Ref Range Status   Specimen Description BLOOD LEFT ARM  Final   Special Requests   Final    BOTTLES DRAWN AEROBIC AND ANAEROBIC Blood Culture adequate volume   Culture   Final    NO GROWTH 4 DAYS Performed at [facility], [address] [place], [place] [address]    Report Status PENDING  Incomplete  Expectorated sputum assessment w rflx to resp cult     Status: None   Collection Time: 01/02/19  7:19 PM  Result Value Ref Range Status   Specimen Description EXPECTORATED SPUTUM  Final   Special Requests NONE  Final   Sputum evaluation   Final    THIS SPECIMEN IS ACCEPTABLE FOR SPUTUM CULTURE Performed at [facility], [address], [place], [place] [address]    Report Status 01/02/2019 FINAL  Final  Culture, respiratory     Status: None (Preliminary result)   Collection Time: 01/02/19  7:19 PM  Result Value Ref Range Status   Specimen Description   Final    EXPECTORATED SPUTUM Performed at [facility], [address] [place], [place] [address]    Special Requests   Final    NONE Reflexed from [id number]  Performed at [facility], [address] [place], [place]  [address]    Gram Stain   Final    FEW WBC PRESENT, PREDOMINANTLY PMN FEW GRAM POSITIVE COCCI FEW GRAM POSITIVE RODS Performed at [facility], [address] [place], [place] [address]    Culture PENDING  Incomplete   Report Status PENDING  Incomplete   Studies/Results: No results found.  Medications:  Prior to Admission:  Medications Prior to Admission  Medication Sig Dispense Refill Last Dose  . carvedilol (COREG) 12.5 MG tablet TAKE 1 TABLET BY MOUTH TWICE DAILY WITH MEALS 180 tablet 3 12/31/2018 at 1000  . clonazePAM (KLONOPIN) 1 MG tablet Take 1 mg by mouth 4 (four) times daily as needed for anxiety.   12/31/2018 at Unknown time  . doxycycline (VIBRAMYCIN) 100 MG capsule Take 1 capsule (100 mg total) by mouth 2 (two) times daily. 10 capsule 0 12/31/2018 at Unknown time  . DULoxetine (CYMBALTA) 60 MG capsule Take 60 mg by mouth every morning.   12/31/2018 at Unknown time  . furosemide (LASIX) 40 MG tablet TAKE 1 TABLET BY MOUTH DAILY AS NEEDED FOR SWELLING. 30 tablet 6   . furosemide (LASIX) 80 MG tablet TAKE 1 TABLET BY MOUTH DAILY 90 tablet 1 12/31/2018 at Unknown time  . gabapentin (NEURONTIN) 300 MG capsule Take 300 mg by mouth 2 (two) times daily.   12/31/2018 at Unknown time  . HYDROcodone-acetaminophen (NORCO) 10-325 MG per tablet Take 0.5 tablets by mouth 4 (four) times daily as needed for moderate pain or severe pain. pain   12/31/2018 at Unknown time  . isosorbide mononitrate (IMDUR) 30 MG 24 hr tablet TAKE 1 TABLET BY MOUTH DAILY 90 tablet 1 12/31/2018 at Unknown time  . losartan (COZAAR) 25 MG tablet TAKE 1/2 TABLET BY MOUTH EVERY DAY 45 tablet 1 12/31/2018 at Unknown time  . Multiple Vitamins-Minerals (MULTIVITAMINS THER. W/MINERALS) TABS Take 1 tablet by mouth daily.     12/31/2018 at Unknown time  . nicotine (NICODERM CQ - DOSED IN MG/24 HR) 7 mg/24hr patch Place 1 patch (7 mg total) onto the skin daily. 28 patch 0 12/31/2018 at Unknown time  . nitroGLYCERIN (NITROSTAT) 0.4 MG SL  tablet PLACE 1 TABLET UNDER THE TONGUE EVERY 5 MINUTES FOR 3 DOSES AS NEEDED CHEST PAIN 25 tablet 3 Taking  . oseltamivir (TAMIFLU) 75 MG capsule Take 1 capsule (75 mg total) by mouth 2 (two) times daily. 6 capsule 0 12/31/2018 at Unknown time  . potassium chloride SA (K-DUR,KLOR-CON) 20 MEQ tablet TAKE 1 TABLET BY MOUTH EVERY DAY 90 tablet 3 12/31/2018 at Unknown time  . promethazine (PHENERGAN) 25 MG tablet Take 25 mg by mouth every 6 (six) hours as needed for nausea.   12/31/2018 at Unknown time  . ranitidine (ZANTAC) 150 MG tablet Take 150 mg by mouth 2 (two) times daily.   12/31/2018 at Unknown time  . spironolactone (ALDACTONE) 25 MG tablet TAKE 1 TABLET BY MOUTH EVERY DAY 90 tablet 1 12/31/2018 at Unknown time  . warfarin (COUMADIN) 10 MG tablet Take 1 tablet daily except 1/2 tablet on Thursdays (Patient taking differently: Take 5-10 mg by mouth See admin instructions. TAKES 5MG  ON TUESDAYS AND THURSDAYS AND TAKES 10MG  ON ALL OTHER DAYS) 30 tablet 3 12/31/2018 at 1000   Scheduled: . acyclovir cream   Topical Q3H  . DULoxetine  60 mg Oral q morning - 10a  . gabapentin  300 mg Oral BID  . guaiFENesin  1,200 mg  Oral BID  . ipratropium-albuterol  3 mL Nebulization TID  . magic mouthwash  5 mL Oral TID PC & HS  . nicotine  21 mg Transdermal Daily  . potassium chloride SA  20 mEq Oral Daily  . spironolactone  25 mg Oral Daily  . warfarin  5 mg Oral Once  . Warfarin - Pharmacist Dosing Inpatient   Does not apply q1800   Continuous: . ceFEPime (MAXIPIME) IV 2 g (01/04/19 0300)  . vancomycin 1,000 mg (01/03/19 2022)   FSE:[id number], clonazePAM, HYDROcodone-acetaminophen, HYDROcodone-homatropine, ipratropium-albuterol, ondansetron **OR** ondansetron (ZOFRAN) IV  Assesment: She was admitted with healthcare associated pneumonia following influenza.  She had systemic inflammatory response syndrome when she was admitted.  This has resolved.  Her pneumonia is improving  She had acute hypoxic respiratory  failure and that seems to have resolved  She is chronically anticoagulated because of hypercoagulable state stable  She has coronary disease but no chest pain recently  She has had cardiac arrhythmias and has an defibrillator which has not fired Active Problems:   Hyperlipidemia   Essential hypertension, benign   Cardiomyopathy, ischemic   CEREBROVASCULAR DISEASE   Peripheral vascular disease (HCC)   Anticardiolipin syndrome (HCC)   Long term current use of anticoagulant   Dual implantable cardioverter-defibrillator in situ   Chronic combined systolic (congestive) and diastolic (congestive) heart failure (HCC)   Pneumonia    Plan: Continue current treatments.  Treat her for thrush and potential herpes cold sore.  Not quite ready for discharge    LOS: 4 days   Fredirick Maudlin 01/04/2019, 8:25 AM

## 2019-01-04 NOTE — Care Management Important Message (Signed)
Important Message  Patient Details  Name: [name] [name] MRN: [id number] Date of Birth: [birth year]   Medicare Important Message Given:  Yes    Renie Ora 01/04/2019, 11:48 AM

## 2019-01-04 NOTE — Progress Notes (Signed)
ANTICOAGULATION CONSULT NOTE - Follow Up Consult  Pharmacy Consult for warfarin Indication: Hypercoagulable state with anti-cardiolipin antibody  Home dose: warfarin 5mg  po Tuesday and Thursdays and  warfarin 10mg  po all other days    Patient Measurements: Height: 5\' 6"  (167.6 cm) Weight: 224 lb (101.6 kg) IBW/kg (Calculated) : 59.3  Temp: 98 F (36.7 C) (01/06 0547) Temp Source: Oral (01/06 0547) BP: 126/66 (01/06 0547) Pulse Rate: 71 (01/06 0618)  Labs: Recent Labs    01/02/19 5465 01/03/19 0624 01/04/19 0621  HGB 11.4* 11.9* 13.2  HCT 36.2 39.2 43.2  PLT 407* 435* 398  LABPROT 27.2* 26.7* 23.7*  INR 2.57 2.50 2.15  CREATININE  --   --  0.59    Estimated Creatinine Clearance: 95.6 mL/min (by C-G formula based on SCr of 0.59 mg/dL).   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 INR remains in range today, despite the fact that the  warfarin 5mg  dose ordered yesterday was not given.      Goal of Therapy:  INR 2-3 Monitor platelets by anticoagulation protocol: Yes   Plan:  Give  warfarin 7. 5mg  x1 dose today Daily INR  Monitor for S&S of bleeding   Tama High 01/04/2019,9:12 AM

## 2019-01-04 NOTE — Progress Notes (Addendum)
Pharmacy Antibiotic Note  Today is day #6 of vancomycin and day #5 of cefepime therapy for this 58 yo female admitted with bilateral HCAP. WBC count is wnl and patient is currently afebrile. Blood cultures have shown no growth to date and sputum culture has been ordered.  Renal function appears stable.  Vancomycin trough of 49mcg/mL is within desired goal range  Plan: continue  cefepime 2g IV q12h Continue vancomycin 1g IV q12h--assess need for continued treatment with cefepime on board.  Goal VTR: 15-39mcg/mL   Pharmacy will continue to monitor patient progress, cultures and vancomycin troughs as clinically indicated.  Height: 5\' 6"  (167.6 cm) Weight: 224 lb (101.6 kg) IBW/kg (Calculated) : 59.3  Temp (24hrs), Avg:97.9 F (36.6 C), Min:97.8 F (36.6 C), Max:98 F (36.7 C)  Recent Labs  Lab 12/28/18 1020 12/31/18 1328 12/31/18 1344 12/31/18 1646 01/01/19 0119 01/02/19 0608 01/03/19 0624 01/04/19 0621  WBC 5.5 14.4*  --   --  9.4 9.2 8.3 8.6  CREATININE 0.68 0.65  --   --  0.63  --   --  0.59  LATICACIDVEN  --   --  1.32 1.09  --   --   --   --     Estimated Creatinine Clearance: 95.6 mL/min (by C-G formula based on SCr of 0.59 mg/dL).    Antimicrobials this admission:  Vanc 1/2 >>  Aztreonam 1/2 >> 1/3 Cefepime 1/3>>    Microbiology results:  1/2 BC x2: NG X 4 days 1/4 Sputum: in progress 12/25: BC x2: NG x5 days        Thank you for allowing pharmacy to be a part of this patient's care.  Tama High 01/04/2019 9:06 AM

## 2019-01-05 DIAGNOSIS — J440 Chronic obstructive pulmonary disease with acute lower respiratory infection: Secondary | ICD-10-CM | POA: Diagnosis not present

## 2019-01-05 DIAGNOSIS — J9601 Acute respiratory failure with hypoxia: Secondary | ICD-10-CM | POA: Diagnosis not present

## 2019-01-05 DIAGNOSIS — F17290 Nicotine dependence, other tobacco product, uncomplicated: Secondary | ICD-10-CM | POA: Diagnosis not present

## 2019-01-05 DIAGNOSIS — J189 Pneumonia, unspecified organism: Secondary | ICD-10-CM | POA: Diagnosis not present

## 2019-01-05 DIAGNOSIS — I509 Heart failure, unspecified: Secondary | ICD-10-CM | POA: Diagnosis not present

## 2019-01-05 DIAGNOSIS — I25119 Atherosclerotic heart disease of native coronary artery with unspecified angina pectoris: Secondary | ICD-10-CM | POA: Diagnosis not present

## 2019-01-05 DIAGNOSIS — Z79899 Other long term (current) drug therapy: Secondary | ICD-10-CM | POA: Diagnosis not present

## 2019-01-05 DIAGNOSIS — B002 Herpesviral gingivostomatitis and pharyngotonsillitis: Secondary | ICD-10-CM | POA: Diagnosis not present

## 2019-01-05 DIAGNOSIS — D6861 Antiphospholipid syndrome: Secondary | ICD-10-CM | POA: Diagnosis not present

## 2019-01-05 DIAGNOSIS — Z7901 Long term (current) use of anticoagulants: Secondary | ICD-10-CM | POA: Diagnosis not present

## 2019-01-05 DIAGNOSIS — I5042 Chronic combined systolic (congestive) and diastolic (congestive) heart failure: Secondary | ICD-10-CM | POA: Diagnosis not present

## 2019-01-05 LAB — CULTURE, RESPIRATORY W GRAM STAIN: Culture: NORMAL

## 2019-01-05 LAB — CULTURE, BLOOD (ROUTINE X 2)
Culture: NO GROWTH
Culture: NO GROWTH
Special Requests: ADEQUATE
Special Requests: ADEQUATE

## 2019-01-05 LAB — CBC
HCT: 39.9 % (ref 36.0–46.0)
Hemoglobin: 12.3 g/dL (ref 12.0–15.0)
MCH: 28.9 pg (ref 26.0–34.0)
MCHC: 30.8 g/dL (ref 30.0–36.0)
MCV: 93.7 fL (ref 80.0–100.0)
Platelets: 306 10*3/uL (ref 150–400)
RBC: 4.26 MIL/uL (ref 3.87–5.11)
RDW: 13 % (ref 11.5–15.5)
WBC: 7.1 10*3/uL (ref 4.0–10.5)
nRBC: 0 % (ref 0.0–0.2)

## 2019-01-05 LAB — PROTIME-INR
INR: 2.17
Prothrombin Time: 23.9 s — ABNORMAL HIGH (ref 11.4–15.2)

## 2019-01-05 LAB — CULTURE, RESPIRATORY

## 2019-01-05 MED ORDER — WARFARIN SODIUM 5 MG PO TABS
5.0000 mg | ORAL_TABLET | Freq: Once | ORAL | Status: AC
  Administered 2019-01-05: 5 mg via ORAL
  Filled 2019-01-05: qty 1

## 2019-01-05 MED ORDER — LEVOFLOXACIN 500 MG PO TABS
500.0000 mg | ORAL_TABLET | Freq: Every day | ORAL | Status: DC
  Administered 2019-01-05 – 2019-01-06 (×2): 500 mg via ORAL
  Filled 2019-01-05 (×2): qty 1

## 2019-01-05 NOTE — Progress Notes (Signed)
Subjective: She is still coughing up a lot of sputum.  She is having trouble with IV access.  She has no other new complaints.  She is still short of breath.  Objective: Vital signs in last 24 hours: Temp:  [98.3 F (36.8 C)-98.8 F (37.1 C)] 98.3 F (36.8 C) (01/07 0648) Pulse Rate:  [74-88] 79 (01/07 0648) Resp:  [17-20] 17 (01/07 0648) BP: (119-130)/(71-102) 126/102 (01/07 0648) SpO2:  [93 %-98 %] 94 % (01/07 0724) Weight change:  Last BM Date: 01/01/19  Intake/Output from previous day: 01/06 0701 - 01/07 0700 In: 240 [P.O.:240] Out: -   PHYSICAL EXAM General appearance: alert, cooperative and no distress Resp: Her chest is clearing appreciably Cardio: regular rate and rhythm, S1, S2 normal, no murmur, click, rub or gallop GI: soft, non-tender; bowel sounds normal; no masses,  no organomegaly Extremities: extremities normal, atraumatic, no cyanosis or edema  Lab Results:  Results for orders placed or performed during the hospital encounter of 12/31/18 (from the past 48 hour(s))  Protime-INR     Status: Abnormal   Collection Time: 01/04/19  6:21 AM  Result Value Ref Range   Prothrombin Time 23.7 (H) 11.4 - 15.2 seconds   INR 2.15     Comment: Performed at [facility], [address] [place], [place] [address]  CBC     Status: None   Collection Time: 01/04/19  6:21 AM  Result Value Ref Range   WBC 8.6 4.0 - 10.5 K/uL   RBC 4.60 3.87 - 5.11 MIL/uL   Hemoglobin 13.2 12.0 - 15.0 g/dL   HCT 36.643.2 44.036.0 - 34.746.0 %   MCV 93.9 80.0 - 100.0 fL   MCH 28.7 26.0 - 34.0 pg   MCHC 30.6 30.0 - 36.0 g/dL   RDW 42.512.9 95.611.5 - 38.715.5 %   Platelets 398 150 - 400 K/uL   nRBC 0.0 0.0 - 0.2 %    Comment: Performed at [facility], [address] [place], [place] [address]  Basic metabolic panel     Status: None   Collection Time: 01/04/19  6:21 AM  Result Value Ref Range   Sodium 137 135 - 145 mmol/L   Potassium 4.6 3.5 - 5.1 mmol/L   Chloride 101 98 - 111 mmol/L   CO2 28 22 - 32 mmol/L    Glucose, Bld 90 70 - 99 mg/dL   BUN 12 6 - 20 mg/dL   Creatinine, Ser 2.950.59 0.44 - 1.00 mg/dL   Calcium 8.9 8.9 - 18.810.3 mg/dL   GFR calc non Af Amer >60 >60 mL/min   GFR calc Af Amer >60 >60 mL/min   Anion gap 8 5 - 15    Comment: Performed at [facility], [address] [place], [place] [address]  Vancomycin, trough     Status: None   Collection Time: 01/04/19  8:31 AM  Result Value Ref Range   Vancomycin Tr 16 15 - 20 ug/mL    Comment: Performed at [facility], [address] [place], [place] [address]  CBC     Status: None   Collection Time: 01/05/19  5:55 AM  Result Value Ref Range   WBC 7.1 4.0 - 10.5 K/uL   RBC 4.26 3.87 - 5.11 MIL/uL   Hemoglobin 12.3 12.0 - 15.0 g/dL   HCT 01.039.9 93.236.0 - 35.546.0 %   MCV 93.7 80.0 - 100.0 fL   MCH 28.9 26.0 - 34.0 pg   MCHC 30.8 30.0 - 36.0 g/dL   RDW  13.0 11.5 - 15.5 %   Platelets 306 150 - 400 K/uL   nRBC 0.0 0.0 - 0.2 %    Comment: Performed at [facility], [address] [place], [place] [address]  Protime-INR     Status: Abnormal   Collection Time: 01/05/19  7:27 AM  Result Value Ref Range   Prothrombin Time 23.9 (H) 11.4 - 15.2 seconds   INR 2.17     Comment: Performed at [facility], [address] [place], [place] [address]    ABGS No results for input(s): PHART, PO2ART, TCO2, HCO3 in the last 72 hours.  Invalid input(s): PCO2 CULTURES Recent Results (from the past 240 hour(s))  Blood Culture (routine x 2)     Status: None   Collection Time: 12/31/18  1:28 PM  Result Value Ref Range Status   Specimen Description BLOOD RIGHT ARM  Final   Special Requests   Final    BOTTLES DRAWN AEROBIC AND ANAEROBIC Blood Culture adequate volume   Culture   Final    NO GROWTH 5 DAYS Performed at [facility], [address], [place], [place] [address]    Report Status 01/05/2019 FINAL  Final  Blood Culture (routine x 2)     Status: None   Collection Time: 12/31/18  1:35 PM  Result Value Ref Range Status   Specimen Description BLOOD  LEFT ARM  Final   Special Requests   Final    BOTTLES DRAWN AEROBIC AND ANAEROBIC Blood Culture adequate volume   Culture   Final    NO GROWTH 5 DAYS Performed at [facility], [address] [place], [place] [address]    Report Status 01/05/2019 FINAL  Final  Expectorated sputum assessment w rflx to resp cult     Status: None   Collection Time: 01/02/19  7:19 PM  Result Value Ref Range Status   Specimen Description EXPECTORATED SPUTUM  Final   Special Requests NONE  Final   Sputum evaluation   Final    THIS SPECIMEN IS ACCEPTABLE FOR SPUTUM CULTURE Performed at [facility], [address] [place], [place] [address]    Report Status 01/02/2019 FINAL  Final  Culture, respiratory     Status: None   Collection Time: 01/02/19  7:19 PM  Result Value Ref Range Status   Specimen Description   Final    EXPECTORATED SPUTUM Performed at [facility], [address], [place], [place] [address]    Special Requests   Final    NONE Reflexed from [id number] Performed at [facility], [address], [place], [place] [address]    Gram Stain   Final    FEW WBC PRESENT, PREDOMINANTLY PMN FEW GRAM POSITIVE COCCI FEW GRAM POSITIVE RODS    Culture   Final    Consistent with normal respiratory flora. Performed at [facility], [address] [place], [place] [address]    Report Status 01/05/2019 FINAL  Final   Studies/Results: No results found.  Medications:  Prior to Admission:  Medications Prior to Admission  Medication Sig Dispense Refill Last Dose  . carvedilol (COREG) 12.5 MG tablet TAKE 1 TABLET BY MOUTH TWICE DAILY WITH MEALS 180 tablet 3 12/31/2018 at 1000  . clonazePAM (KLONOPIN) 1 MG tablet Take 1 mg by mouth 4 (four) times daily as needed for anxiety.   12/31/2018 at Unknown time  . doxycycline (VIBRAMYCIN) 100 MG capsule Take 1 capsule (100 mg total) by mouth 2 (two) times daily. 10 capsule 0 12/31/2018  at Unknown time  . DULoxetine (CYMBALTA) 60 MG capsule Take 60 mg by mouth  every morning.   12/31/2018 at Unknown time  . furosemide (LASIX) 40 MG tablet TAKE 1 TABLET BY MOUTH DAILY AS NEEDED FOR SWELLING. 30 tablet 6   . furosemide (LASIX) 80 MG tablet TAKE 1 TABLET BY MOUTH DAILY 90 tablet 1 12/31/2018 at Unknown time  . gabapentin (NEURONTIN) 300 MG capsule Take 300 mg by mouth 2 (two) times daily.   12/31/2018 at Unknown time  . HYDROcodone-acetaminophen (NORCO) 10-325 MG per tablet Take 0.5 tablets by mouth 4 (four) times daily as needed for moderate pain or severe pain. pain   12/31/2018 at Unknown time  . losartan (COZAAR) 25 MG tablet TAKE 1/2 TABLET BY MOUTH EVERY DAY 45 tablet 1 12/31/2018 at Unknown time  . Multiple Vitamins-Minerals (MULTIVITAMINS THER. W/MINERALS) TABS Take 1 tablet by mouth daily.     12/31/2018 at Unknown time  . nicotine (NICODERM CQ - DOSED IN MG/24 HR) 7 mg/24hr patch Place 1 patch (7 mg total) onto the skin daily. 28 patch 0 12/31/2018 at Unknown time  . nitroGLYCERIN (NITROSTAT) 0.4 MG SL tablet PLACE 1 TABLET UNDER THE TONGUE EVERY 5 MINUTES FOR 3 DOSES AS NEEDED CHEST PAIN 25 tablet 3 Taking  . oseltamivir (TAMIFLU) 75 MG capsule Take 1 capsule (75 mg total) by mouth 2 (two) times daily. 6 capsule 0 12/31/2018 at Unknown time  . potassium chloride SA (K-DUR,KLOR-CON) 20 MEQ tablet TAKE 1 TABLET BY MOUTH EVERY DAY 90 tablet 3 12/31/2018 at Unknown time  . promethazine (PHENERGAN) 25 MG tablet Take 25 mg by mouth every 6 (six) hours as needed for nausea.   12/31/2018 at Unknown time  . ranitidine (ZANTAC) 150 MG tablet Take 150 mg by mouth 2 (two) times daily.   12/31/2018 at Unknown time  . spironolactone (ALDACTONE) 25 MG tablet TAKE 1 TABLET BY MOUTH EVERY DAY 90 tablet 1 12/31/2018 at Unknown time  . warfarin (COUMADIN) 10 MG tablet Take 1 tablet daily except 1/2 tablet on Thursdays (Patient taking differently: Take 5-10 mg by mouth See admin instructions. TAKES 5MG  ON TUESDAYS AND THURSDAYS AND TAKES 10MG  ON ALL OTHER DAYS) 30 tablet 3 12/31/2018 at 1000    Scheduled: . acyclovir cream   Topical Q3H  . DULoxetine  60 mg Oral q morning - 10a  . gabapentin  300 mg Oral BID  . guaiFENesin  1,200 mg Oral BID  . ipratropium-albuterol  3 mL Nebulization TID  . magic mouthwash  5 mL Oral TID PC & HS  . nicotine  21 mg Transdermal Daily  . potassium chloride SA  20 mEq Oral Daily  . spironolactone  25 mg Oral Daily  . warfarin  5 mg Oral Once  . Warfarin - Pharmacist Dosing Inpatient   Does not apply q1800   Continuous: . ceFEPime (MAXIPIME) IV 2 g (01/05/19 0308)  . vancomycin 1,000 mg (01/04/19 1957)   ELF:YBOFBPZWCHENI, clonazePAM, HYDROcodone-acetaminophen, HYDROcodone-homatropine, ipratropium-albuterol, ondansetron **OR** ondansetron (ZOFRAN) IV  Assesment: She is admitted with healthcare associated pneumonia following influenza.  Her sputum culture is growing normal flora she has been on vancomycin and cefepime but is clearing.  She has COPD at baseline and had been smoking up until admission is on a nicotine patch.  She has chronic combined systolic and diastolic heart failure which does not appear to be part of her problem now  She is chronically anticoagulated because of hypercoagulable state.  She has a  cardioverter/pacemaker but it has not fired Active Problems:   Hyperlipidemia   Essential hypertension, benign   Cardiomyopathy, ischemic   CEREBROVASCULAR DISEASE   Peripheral vascular disease (HCC)   Anticardiolipin syndrome (HCC)   Long term current use of anticoagulant   Dual implantable cardioverter-defibrillator in situ   Chronic combined systolic (congestive) and diastolic (congestive) heart failure (HCC)   Pneumonia    Plan: Switch to oral antibiotics because of the trouble with IV access.  Probable discharge in the morning    LOS: 5 days   Fredirick Maudlin 01/05/2019, 8:31 AM

## 2019-01-05 NOTE — Progress Notes (Signed)
ANTICOAGULATION CONSULT NOTE - Follow Up Consult  Pharmacy Consult for warfarin Indication: Hypercoagulable state with anti-cardiolipin antibody  Home dose: warfarin 5mg  po Tuesday and Thursdays and  warfarin 10mg  po all other days    Patient Measurements: Height: 5\' 6"  (167.6 cm) Weight: 224 lb (101.6 kg) IBW/kg (Calculated) : 59.3  Temp: 98.3 F (36.8 C) (01/07 0648) Temp Source: Oral (01/07 0648) BP: 126/102 (01/07 0648) Pulse Rate: 79 (01/07 0648)  Labs: Recent Labs    01/03/19 0624 01/04/19 0621 01/05/19 0555 01/05/19 0727  HGB 11.9* 13.2 12.3  --   HCT 39.2 43.2 39.9  --   PLT 435* 398 306  --   LABPROT 26.7* 23.7*  --  23.9*  INR 2.50 2.15  --  2.17  CREATININE  --  0.59  --   --     Estimated Creatinine Clearance: 95.6 mL/min (by C-G formula based on SCr of 0.59 mg/dL).   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 INR remains in range today, despite the fact that the  warfarin 5mg  dose ordered yesterday was not given.      Goal of Therapy:  INR 2-3 Monitor platelets by anticoagulation protocol: Yes   Plan:  warfarin 5mg  x1 dose today Daily INR  Monitor for S&S of bleeding   Tad Moore 01/05/2019,9:05 AM

## 2019-01-06 DIAGNOSIS — J9601 Acute respiratory failure with hypoxia: Secondary | ICD-10-CM | POA: Diagnosis not present

## 2019-01-06 DIAGNOSIS — D6861 Antiphospholipid syndrome: Secondary | ICD-10-CM | POA: Diagnosis not present

## 2019-01-06 DIAGNOSIS — Z7901 Long term (current) use of anticoagulants: Secondary | ICD-10-CM | POA: Diagnosis not present

## 2019-01-06 DIAGNOSIS — I25119 Atherosclerotic heart disease of native coronary artery with unspecified angina pectoris: Secondary | ICD-10-CM | POA: Diagnosis not present

## 2019-01-06 DIAGNOSIS — I509 Heart failure, unspecified: Secondary | ICD-10-CM | POA: Diagnosis not present

## 2019-01-06 DIAGNOSIS — J440 Chronic obstructive pulmonary disease with acute lower respiratory infection: Secondary | ICD-10-CM | POA: Diagnosis not present

## 2019-01-06 DIAGNOSIS — R6510 Systemic inflammatory response syndrome (SIRS) of non-infectious origin without acute organ dysfunction: Secondary | ICD-10-CM | POA: Diagnosis present

## 2019-01-06 DIAGNOSIS — J189 Pneumonia, unspecified organism: Secondary | ICD-10-CM | POA: Diagnosis not present

## 2019-01-06 DIAGNOSIS — Z79899 Other long term (current) drug therapy: Secondary | ICD-10-CM | POA: Diagnosis not present

## 2019-01-06 DIAGNOSIS — I5042 Chronic combined systolic (congestive) and diastolic (congestive) heart failure: Secondary | ICD-10-CM | POA: Diagnosis not present

## 2019-01-06 DIAGNOSIS — F17290 Nicotine dependence, other tobacco product, uncomplicated: Secondary | ICD-10-CM | POA: Diagnosis not present

## 2019-01-06 DIAGNOSIS — B002 Herpesviral gingivostomatitis and pharyngotonsillitis: Secondary | ICD-10-CM | POA: Diagnosis not present

## 2019-01-06 LAB — BASIC METABOLIC PANEL
Anion gap: 8 (ref 5–15)
BUN: 15 mg/dL (ref 6–20)
CO2: 30 mmol/L (ref 22–32)
Calcium: 9.3 mg/dL (ref 8.9–10.3)
Chloride: 102 mmol/L (ref 98–111)
Creatinine, Ser: 0.68 mg/dL (ref 0.44–1.00)
GFR calc Af Amer: >60 mL/min (ref >60)
GFR calc non Af Amer: >60 mL/min (ref >60)
Glucose, Bld: 77 mg/dL (ref 70–99)
Potassium: 4.2 mmol/L (ref 3.5–5.1)
Sodium: 140 mmol/L (ref 135–145)

## 2019-01-06 MED ORDER — FAMOTIDINE 20 MG PO TABS: 20.0000 mg | ORAL_TABLET | Freq: Two times a day (BID) | ORAL | 1 refills | Status: DC

## 2019-01-06 MED ORDER — LEVOFLOXACIN 500 MG PO TABS: 500.0000 mg | ORAL_TABLET | Freq: Every day | ORAL | 0 refills | Status: DC

## 2019-01-06 MED ORDER — GUAIFENESIN ER 600 MG PO TB12: 1200.0000 mg | ORAL_TABLET | Freq: Two times a day (BID) | ORAL | 1 refills | Status: DC

## 2019-01-06 MED ORDER — ACYCLOVIR 5 % EX CREA: 1.0000 "application " | TOPICAL_CREAM | CUTANEOUS | 0 refills | Status: DC

## 2019-01-06 NOTE — Discharge Summary (Addendum)
Physician Discharge Summary  Patient ID: [name] [name] MRN: [id number] DOB/AGE: [birth year] 58 y.o. Primary Care Physician:Reyonna Haack, Ramon Dredge, MD Admit date: 12/31/2018 Discharge date: 01/06/2019    Discharge Diagnoses:   Active Problems:   Hyperlipidemia   Essential hypertension, benign   Cardiomyopathy, ischemic   CEREBROVASCULAR DISEASE   Peripheral vascular disease (HCC)   Anticardiolipin syndrome (HCC)   Long term current use of anticoagulant   Dual implantable cardioverter-defibrillator in situ   Chronic combined systolic (congestive) and diastolic (congestive) heart failure (HCC)   Tobacco dependence   HCAP (healthcare-associated pneumonia)   SIRS (systemic inflammatory response syndrome) (HCC) Herpes stomatitis  Allergies as of 01/06/2019      Reactions   Metoclopramide Hcl Anaphylaxis   Non responsive   Penicillins Anaphylaxis   Pt states she can take any cephalosporin (per pt. 01/01/19) Shock Has patient had a PCN reaction causing immediate rash, facial/tongue/throat swelling, SOB or lightheadedness with hypotension: Yes Has patient had a PCN reaction causing severe rash involving mucus membranes or skin necrosis: No Has patient had a PCN reaction that required hospitalization Yes Has patient had a PCN reaction occurring within the last 10 years: No If all of the above answers are "NO", then may proceed with Cephalosporin    Metoprolol Other (See Comments)   Syncope   Statins Other (See Comments)   Myalgias      Medication List    STOP taking these medications   doxycycline 100 MG capsule Commonly known as:  VIBRAMYCIN   ranitidine 150 MG tablet Commonly known as:  ZANTAC     TAKE these medications   acyclovir cream 5 % Commonly known as:  ZOVIRAX Apply 1 application topically every 3 (three) hours.   carvedilol 12.5 MG tablet Commonly known as:  COREG TAKE 1 TABLET BY MOUTH TWICE DAILY WITH MEALS   clonazePAM 1 MG tablet Commonly known as:   KLONOPIN Take 1 mg by mouth 4 (four) times daily as needed for anxiety.   DULoxetine 60 MG capsule Commonly known as:  CYMBALTA Take 60 mg by mouth every morning.   famotidine 20 MG tablet Commonly known as:  PEPCID Take 1 tablet (20 mg total) by mouth 2 (two) times daily.   furosemide 80 MG tablet Commonly known as:  LASIX TAKE 1 TABLET BY MOUTH DAILY   furosemide 40 MG tablet Commonly known as:  LASIX TAKE 1 TABLET BY MOUTH DAILY AS NEEDED FOR SWELLING.   gabapentin 300 MG capsule Commonly known as:  NEURONTIN Take 300 mg by mouth 2 (two) times daily.   guaiFENesin 600 MG 12 hr tablet Commonly known as:  MUCINEX Take 2 tablets (1,200 mg total) by mouth 2 (two) times daily.   HYDROcodone-acetaminophen 10-325 MG tablet Commonly known as:  NORCO Take 0.5 tablets by mouth 4 (four) times daily as needed for moderate pain or severe pain. pain   isosorbide mononitrate 30 MG 24 hr tablet Commonly known as:  IMDUR TAKE 1 TABLET BY MOUTH DAILY   levofloxacin 500 MG tablet Commonly known as:  LEVAQUIN Take 1 tablet (500 mg total) by mouth daily.   losartan 25 MG tablet Commonly known as:  COZAAR TAKE 1/2 TABLET BY MOUTH EVERY DAY   multivitamins ther. w/minerals Tabs tablet Take 1 tablet by mouth daily.   nicotine 7 mg/24hr patch Commonly known as:  NICODERM CQ - dosed in mg/24 hr Place 1 patch (7 mg total) onto the skin daily.   nitroGLYCERIN 0.4 MG SL tablet Commonly known  as:  NITROSTAT PLACE 1 TABLET UNDER THE TONGUE EVERY 5 MINUTES FOR 3 DOSES AS NEEDED CHEST PAIN   oseltamivir 75 MG capsule Commonly known as:  TAMIFLU Take 1 capsule (75 mg total) by mouth 2 (two) times daily.   potassium chloride SA 20 MEQ tablet Commonly known as:  K-DUR,KLOR-CON TAKE 1 TABLET BY MOUTH EVERY DAY   promethazine 25 MG tablet Commonly known as:  PHENERGAN Take 25 mg by mouth every 6 (six) hours as needed for nausea.   spironolactone 25 MG tablet Commonly known as:   ALDACTONE TAKE 1 TABLET BY MOUTH EVERY DAY   warfarin 10 MG tablet Commonly known as:  COUMADIN Take as directed. If you are unsure how to take this medication, talk to your nurse or doctor. Original instructions:  Take 1 tablet daily except 1/2 tablet on Thursdays What changed:    how much to take  how to take this  when to take this  additional instructions       Discharged Condition: Improved    Consults: None  Significant Diagnostic Studies: Dg Chest 2 View  Result Date: 12/31/2018 CLINICAL DATA:  Congestion, shortness of Breath EXAM: CHEST - 2 VIEW COMPARISON:  12/23/2018 FINDINGS: Left AICD remains in place, unchanged. Heart is borderline in size. Perihilar and lower lobe opacities and interstitial prominence could reflect early interstitial edema. No effusions or acute bony abnormality. IMPRESSION: Bilateral mid and lower lung interstitial prominence could reflect early interstitial edema. Electronically Signed   By: Charlett NoseKevin  Dover M.D.   On: 12/31/2018 12:44   Dg Chest 2 View  Result Date: 12/24/2018 CLINICAL DATA:  Acute onset of cough, shortness of breath and chills. Chest tightness. EXAM: CHEST - 2 VIEW COMPARISON:  Chest radiograph performed 01/14/2018 FINDINGS: The lungs are well-aerated. Vascular congestion is noted. Increased interstitial markings raise concern for mild interstitial edema, though pneumonia could have a similar appearance. Peribronchial thickening is noted. There is no evidence of pleural effusion or pneumothorax. The heart is mildly enlarged. A pacemaker/AICD is noted at the left chest wall, with leads ending at the right atrium and right ventricle. Lumbar spinal fusion hardware is partially imaged. No acute osseous abnormalities are seen. IMPRESSION: Vascular congestion and mild cardiomegaly. Increased interstitial markings raise concern for mild interstitial edema, though pneumonia could have a similar appearance. Peribronchial thickening noted.  Electronically Signed   By: Roanna RaiderJeffery  Chang M.D.   On: 12/24/2018 00:16   Ct Chest Wo Contrast  Result Date: 12/31/2018 CLINICAL DATA:  Cough.  Dyspnea.  Recent diagnosis of influenza. EXAM: CT CHEST WITHOUT CONTRAST TECHNIQUE: Multidetector CT imaging of the chest was performed following the standard protocol without IV contrast. COMPARISON:  Chest radiograph from earlier today. FINDINGS: Cardiovascular: Mild cardiomegaly. No significant pericardial effusion/thickening. Three-vessel coronary atherosclerosis. Two lead left subclavian ICD is noted with lead tips in the right atrium and right ventricular apex. Atherosclerotic nonaneurysmal thoracic aorta. Top-normal caliber main pulmonary artery (3.1 cm diameter). Mediastinum/Nodes: No discrete thyroid nodules. Unremarkable esophagus. No axillary adenopathy. Mild right paratracheal adenopathy measuring up to 1.3 cm (series 2/image 53). Enlarged 1.3 cm subcarinal node (series 2/image 67). Subjective mild bilateral hilar adenopathy, poorly delineated on this noncontrast scan. Lungs/Pleura: No pneumothorax. No pleural effusion. Extensive patchy consolidation and ground-glass opacity throughout the bilateral basilar lower lobes. Less prominent patchy peribronchovascular consolidation and ground-glass opacity throughout the remaining lungs involving all lung lobes. No discrete lung masses. No significant regions of bronchiectasis. Upper abdomen: Small hiatal hernia. Musculoskeletal: No aggressive appearing  focal osseous lesions. Mild thoracic spondylosis. Partially visualized left lateral spinal fusion hardware in the upper lumbar spine. IMPRESSION: 1. Extensive patchy consolidation and ground-glass opacity in the dependent lower lobes bilaterally, with less prominent patchy peribronchovascular consolidation and ground-glass opacity throughout the remaining lungs. Favor multilobar infectious pneumonia. Suggest follow-up chest imaging in 1-2 months to document resolution.  2. Mild cardiomegaly. 3. Mild mediastinal and bilateral hilar adenopathy, nonspecific, most likely reactive. 4. Three-vessel coronary atherosclerosis. Aortic Atherosclerosis (ICD10-I70.0). Electronically Signed   By: Delbert Phenix M.D.   On: 12/31/2018 15:08    Lab Results: Basic Metabolic Panel: Recent Labs    01/04/19 0621 01/06/19 0511  NA 137 140  K 4.6 4.2  CL 101 102  CO2 28 30  GLUCOSE 90 77  BUN 12 15  CREATININE 0.59 0.68  CALCIUM 8.9 9.3   Liver Function Tests: No results for input(s): AST, ALT, ALKPHOS, BILITOT, PROT, ALBUMIN in the last 72 hours.   CBC: Recent Labs    01/04/19 0621 01/05/19 0555  WBC 8.6 7.1  HGB 13.2 12.3  HCT 43.2 39.9  MCV 93.9 93.7  PLT 398 306    Recent Results (from the past 240 hour(s))  Blood Culture (routine x 2)     Status: None   Collection Time: 12/31/18  1:28 PM  Result Value Ref Range Status   Specimen Description BLOOD RIGHT ARM  Final   Special Requests   Final    BOTTLES DRAWN AEROBIC AND ANAEROBIC Blood Culture adequate volume   Culture   Final    NO GROWTH 5 DAYS Performed at [facility], [address], [place], [place] [address]    Report Status 01/05/2019 FINAL  Final  Blood Culture (routine x 2)     Status: None   Collection Time: 12/31/18  1:35 PM  Result Value Ref Range Status   Specimen Description BLOOD LEFT ARM  Final   Special Requests   Final    BOTTLES DRAWN AEROBIC AND ANAEROBIC Blood Culture adequate volume   Culture   Final    NO GROWTH 5 DAYS Performed at [facility], [address] [place], [place] [address]    Report Status 01/05/2019 FINAL  Final  Expectorated sputum assessment w rflx to resp cult     Status: None   Collection Time: 01/02/19  7:19 PM  Result Value Ref Range Status   Specimen Description EXPECTORATED SPUTUM  Final   Special Requests NONE  Final   Sputum evaluation   Final    THIS SPECIMEN IS ACCEPTABLE FOR SPUTUM CULTURE Performed at [facility], [address],  [place], [place] [address]    Report Status 01/02/2019 FINAL  Final  Culture, respiratory     Status: None   Collection Time: 01/02/19  7:19 PM  Result Value Ref Range Status   Specimen Description   Final    EXPECTORATED SPUTUM Performed at [facility], [address] [place], [place] [address]    Special Requests   Final    NONE Reflexed from [id number] Performed at [facility], [address], [place], [place] [address]    Gram Stain   Final    FEW WBC PRESENT, PREDOMINANTLY PMN FEW GRAM POSITIVE COCCI FEW GRAM POSITIVE RODS    Culture   Final    Consistent with normal respiratory flora. Performed at [facility], [address], [place], [place] [address]    Report Status 01/05/2019 FINAL  Final     Hospital  Course: This is a 58 year old who had been in the hospital with influenza A.  She did well initially went home had more problems came to my office and appeared to be somewhat dehydrated.  She was treated with oral fluids and improved but then became much worse with fever chills and eventually came to the emergency room.  She was found to have healthcare associated pneumonia.  She initially had systemic inflammatory response syndrome which resolved.  She was treated with IV fluids IV antibiotics inhaled bronchodilators and improved.  She had some herpes stomatitis which was treated.  She was transitioned to oral treatment and was ready for discharge on the eighth.  Discharge Exam: Blood pressure 121/63, pulse 74, temperature 97.8 F (36.6 C), temperature source Oral, resp. rate 19, height 5\' 6"  (1.676 m), weight 101.6 kg, SpO2 95 %. She is awake and alert.  Chest is clear.  Heart is regular.  Disposition: Home she does not need home health services.she will contact coumadin clinic for pt in the next 2 days  Discharge Instructions    Discharge patient   Complete by:  As directed    Discharge disposition:  01-Home or Self Care   Discharge patient date:  01/06/2019         Signed: Fredirick Maudlin   01/06/2019, 8:34 AM

## 2019-01-06 NOTE — Progress Notes (Signed)
Subjective: She feels much better.  She is ready for discharge.  She is still coughing some but not as much and it is mostly nonproductive  Objective: Vital signs in last 24 hours: Temp:  [97.8 F (36.6 C)-98.9 F (37.2 C)] 97.8 F (36.6 C) (01/08 0740) Pulse Rate:  [71-75] 74 (01/08 0740) Resp:  [16-19] 19 (01/08 0740) BP: (121-129)/(63-82) 121/63 (01/08 0740) SpO2:  [95 %-100 %] 95 % (01/08 0740) Weight change:  Last BM Date: 01/01/19  Intake/Output from previous day: 01/07 0701 - 01/08 0700 In: 2160 [P.O.:2160] Out: -   PHYSICAL EXAM General appearance: alert, cooperative and no distress Resp: clear to auscultation bilaterally Cardio: regular rate and rhythm, S1, S2 normal, no murmur, click, rub or gallop GI: soft, non-tender; bowel sounds normal; no masses,  no organomegaly Extremities: extremities normal, atraumatic, no cyanosis or edema  Lab Results:  Results for orders placed or performed during the hospital encounter of 12/31/18 (from the past 48 hour(s))  Vancomycin, trough     Status: None   Collection Time: 01/04/19  8:31 AM  Result Value Ref Range   Vancomycin Tr 16 15 - 20 ug/mL    Comment: Performed at [facility], [address] [place], [place] [address]  CBC     Status: None   Collection Time: 01/05/19  5:55 AM  Result Value Ref Range   WBC 7.1 4.0 - 10.5 K/uL   RBC 4.26 3.87 - 5.11 MIL/uL   Hemoglobin 12.3 12.0 - 15.0 g/dL   HCT 60.439.9 54.036.0 - 98.146.0 %   MCV 93.7 80.0 - 100.0 fL   MCH 28.9 26.0 - 34.0 pg   MCHC 30.8 30.0 - 36.0 g/dL   RDW 19.113.0 47.811.5 - 29.515.5 %   Platelets 306 150 - 400 K/uL   nRBC 0.0 0.0 - 0.2 %    Comment: Performed at [facility], [address] [place], [place] [address]  Protime-INR     Status: Abnormal   Collection Time: 01/05/19  7:27 AM  Result Value Ref Range   Prothrombin Time 23.9 (H) 11.4 - 15.2 seconds   INR 2.17     Comment: Performed at [facility], [address] [place], [place] [address]  Basic metabolic panel      Status: None   Collection Time: 01/06/19  5:11 AM  Result Value Ref Range   Sodium 140 135 - 145 mmol/L   Potassium 4.2 3.5 - 5.1 mmol/L   Chloride 102 98 - 111 mmol/L   CO2 30 22 - 32 mmol/L   Glucose, Bld 77 70 - 99 mg/dL   BUN 15 6 - 20 mg/dL   Creatinine, Ser 4.690.68 0.44 - 1.00 mg/dL   Calcium 9.3 8.9 - 62.910.3 mg/dL   GFR calc non Af Amer >60 >60 mL/min   GFR calc Af Amer >60 >60 mL/min   Anion gap 8 5 - 15    Comment: Performed at [facility], [address] [place], [place] [address]    ABGS No results for input(s): PHART, PO2ART, TCO2, HCO3 in the last 72 hours.  Invalid input(s): PCO2 CULTURES Recent Results (from the past 240 hour(s))  Blood Culture (routine x 2)     Status: None   Collection Time: 12/31/18  1:28 PM  Result Value Ref Range Status   Specimen Description BLOOD RIGHT ARM  Final   Special Requests   Final    BOTTLES DRAWN AEROBIC AND ANAEROBIC Blood Culture adequate volume   Culture  Final    NO GROWTH 5 DAYS Performed at [facility], [address] [place], [place] [address]    Report Status 01/05/2019 FINAL  Final  Blood Culture (routine x 2)     Status: None   Collection Time: 12/31/18  1:35 PM  Result Value Ref Range Status   Specimen Description BLOOD LEFT ARM  Final   Special Requests   Final    BOTTLES DRAWN AEROBIC AND ANAEROBIC Blood Culture adequate volume   Culture   Final    NO GROWTH 5 DAYS Performed at [facility], [address] [place], [place] [address]    Report Status 01/05/2019 FINAL  Final  Expectorated sputum assessment w rflx to resp cult     Status: None   Collection Time: 01/02/19  7:19 PM  Result Value Ref Range Status   Specimen Description EXPECTORATED SPUTUM  Final   Special Requests NONE  Final   Sputum evaluation   Final    THIS SPECIMEN IS ACCEPTABLE FOR SPUTUM CULTURE Performed at [facility], [address] [place], [place] [address]    Report Status 01/02/2019 FINAL  Final  Culture, respiratory      Status: None   Collection Time: 01/02/19  7:19 PM  Result Value Ref Range Status   Specimen Description   Final    EXPECTORATED SPUTUM Performed at [facility], [address] [place], [place] [address]    Special Requests   Final    NONE Reflexed from [id number] Performed at [facility], [address] [place], [place] [address]    Gram Stain   Final    FEW WBC PRESENT, PREDOMINANTLY PMN FEW GRAM POSITIVE COCCI FEW GRAM POSITIVE RODS    Culture   Final    Consistent with normal respiratory flora. Performed at [facility], [address] [place], [place] [address]    Report Status 01/05/2019 FINAL  Final   Studies/Results: No results found.  Medications:  Prior to Admission:  Medications Prior to Admission  Medication Sig Dispense Refill Last Dose  . carvedilol (COREG) 12.5 MG tablet TAKE 1 TABLET BY MOUTH TWICE DAILY WITH MEALS 180 tablet 3 12/31/2018 at 1000  . clonazePAM (KLONOPIN) 1 MG tablet Take 1 mg by mouth 4 (four) times daily as needed for anxiety.   12/31/2018 at Unknown time  . doxycycline (VIBRAMYCIN) 100 MG capsule Take 1 capsule (100 mg total) by mouth 2 (two) times daily. 10 capsule 0 12/31/2018 at Unknown time  . DULoxetine (CYMBALTA) 60 MG capsule Take 60 mg by mouth every morning.   12/31/2018 at Unknown time  . furosemide (LASIX) 40 MG tablet TAKE 1 TABLET BY MOUTH DAILY AS NEEDED FOR SWELLING. 30 tablet 6   . furosemide (LASIX) 80 MG tablet TAKE 1 TABLET BY MOUTH DAILY 90 tablet 1 12/31/2018 at Unknown time  . gabapentin (NEURONTIN) 300 MG capsule Take 300 mg by mouth 2 (two) times daily.   12/31/2018 at Unknown time  . HYDROcodone-acetaminophen (NORCO) 10-325 MG per tablet Take 0.5 tablets by mouth 4 (four) times daily as needed for moderate pain or severe pain. pain   12/31/2018 at Unknown time  . losartan (COZAAR) 25 MG tablet TAKE 1/2 TABLET BY MOUTH EVERY DAY 45 tablet 1 12/31/2018 at Unknown time  . Multiple Vitamins-Minerals (MULTIVITAMINS THER. W/MINERALS)  TABS Take 1 tablet by mouth daily.     12/31/2018 at Unknown time  . nicotine (NICODERM CQ - DOSED IN MG/24 HR) 7 mg/24hr patch  Place 1 patch (7 mg total) onto the skin daily. 28 patch 0 12/31/2018 at Unknown time  . nitroGLYCERIN (NITROSTAT) 0.4 MG SL tablet PLACE 1 TABLET UNDER THE TONGUE EVERY 5 MINUTES FOR 3 DOSES AS NEEDED CHEST PAIN 25 tablet 3 Taking  . oseltamivir (TAMIFLU) 75 MG capsule Take 1 capsule (75 mg total) by mouth 2 (two) times daily. 6 capsule 0 12/31/2018 at Unknown time  . potassium chloride SA (K-DUR,KLOR-CON) 20 MEQ tablet TAKE 1 TABLET BY MOUTH EVERY DAY 90 tablet 3 12/31/2018 at Unknown time  . promethazine (PHENERGAN) 25 MG tablet Take 25 mg by mouth every 6 (six) hours as needed for nausea.   12/31/2018 at Unknown time  . ranitidine (ZANTAC) 150 MG tablet Take 150 mg by mouth 2 (two) times daily.   12/31/2018 at Unknown time  . spironolactone (ALDACTONE) 25 MG tablet TAKE 1 TABLET BY MOUTH EVERY DAY 90 tablet 1 12/31/2018 at Unknown time  . warfarin (COUMADIN) 10 MG tablet Take 1 tablet daily except 1/2 tablet on Thursdays (Patient taking differently: Take 5-10 mg by mouth See admin instructions. TAKES 5MG  ON TUESDAYS AND THURSDAYS AND TAKES 10MG  ON ALL OTHER DAYS) 30 tablet 3 12/31/2018 at 1000   Scheduled: . acyclovir cream   Topical Q3H  . DULoxetine  60 mg Oral q morning - 10a  . gabapentin  300 mg Oral BID  . guaiFENesin  1,200 mg Oral BID  . ipratropium-albuterol  3 mL Nebulization TID  . levofloxacin  500 mg Oral Daily  . magic mouthwash  5 mL Oral TID PC & HS  . nicotine  21 mg Transdermal Daily  . potassium chloride SA  20 mEq Oral Daily  . spironolactone  25 mg Oral Daily  . Warfarin - Pharmacist Dosing Inpatient   Does not apply q1800   Continuous:  GMW:[id number], clonazePAM, HYDROcodone-acetaminophen, HYDROcodone-homatropine, ipratropium-albuterol, ondansetron **OR** ondansetron (ZOFRAN) IV  Assesment: She was admitted with healthcare associated pneumonia  following influenza.  She has been treated appropriately and has improved and now is ready for discharge.  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

## 2019-01-06 NOTE — Discharge Instructions (Signed)
Community-Acquired Pneumonia, Adult  Pneumonia is an infection of the lungs. It causes swelling in the airways of the lungs. Mucus and fluid may also build up inside the airways.  One type of pneumonia can happen while a person is in a hospital. A different type can happen when a person is not in a hospital (community-acquired pneumonia).   What are the causes?    This condition is caused by germs (viruses, bacteria, or fungi). Some types of germs can be passed from one person to another. This can happen when you breathe in droplets from the cough or sneeze of an infected person.  What increases the risk?  You are more likely to develop this condition if you:   Have a long-term (chronic) disease, such as:  ? Chronic obstructive pulmonary disease (COPD).  ? Asthma.  ? Cystic fibrosis.  ? Congestive heart failure.  ? Diabetes.  ? Kidney disease.   Have HIV.   Have sickle cell disease.   Have had your spleen removed.   Do not take good care of your teeth and mouth (poor dental hygiene).   Have a medical condition that increases the risk of breathing in droplets from your own mouth and nose.   Have a weakened body defense system (immune system).   Are a smoker.   Travel to areas where the germs that cause this illness are common.   Are around certain animals or the places they live.  What are the signs or symptoms?   A dry cough.   A wet (productive) cough.   Fever.   Sweating.   Chest pain. This often happens when breathing deeply or coughing.   Fast breathing or trouble breathing.   Shortness of breath.   Shaking chills.   Feeling tired (fatigue).   Muscle aches.  How is this treated?  Treatment for this condition depends on many things. Most adults can be treated at home. In some cases, treatment must happen in a hospital. Treatment may include:   Medicines given by mouth or through an IV tube.   Being given extra oxygen.   Respiratory therapy.  In rare cases, treatment for very bad pneumonia  may include:   Using a machine to help you breathe.   Having a procedure to remove fluid from around your lungs.  Follow these instructions at home:  Medicines   Take over-the-counter and prescription medicines only as told by your doctor.  ? Only take cough medicine if you are losing sleep.   If you were prescribed an antibiotic medicine, take it as told by your doctor. Do not stop taking the antibiotic even if you start to feel better.  General instructions     Sleep with your head and neck raised (elevated). You can do this by sleeping in a recliner or by putting a few pillows under your head.   Rest as needed. Get at least 8 hours of sleep each night.   Drink enough water to keep your pee (urine) pale yellow.   Eat a healthy diet that includes plenty of vegetables, fruits, whole grains, low-fat dairy products, and lean protein.   Do not use any products that contain nicotine or tobacco. These include cigarettes, e-cigarettes, and chewing tobacco. If you need help quitting, ask your doctor.   Keep all follow-up visits as told by your doctor. This is important.  How is this prevented?  A shot (vaccine) can help prevent pneumonia. Shots are often suggested for:   People   older than 56 years of age.   People older than 56 years of age who:  ? Are having cancer treatment.  ? Have long-term (chronic) lung disease.  ? Have problems with their body's defense system.  You may also prevent pneumonia if you take these actions:   Get the flu (influenza) shot every year.   Go to the dentist as often as told.   Wash your hands often. If you cannot use soap and water, use hand sanitizer.  Contact a doctor if:   You have a fever.   You lose sleep because your cough medicine does not help.  Get help right away if:   You are short of breath and it gets worse.   You have more chest pain.   Your sickness gets worse. This is very serious if:  ? You are an older adult.  ? Your body's defense system is weak.   You  cough up blood.  Summary   Pneumonia is an infection of the lungs.   Most adults can be treated at home. Some will need treatment in a hospital.   Drink enough water to keep your pee pale yellow.   Get at least 8 hours of sleep each night.  This information is not intended to replace advice given to you by your health care provider. Make sure you discuss any questions you have with your health care provider.  Document Released: 06/03/2008 Document Revised: 08/13/2018 Document Reviewed: 08/13/2018  Elsevier Interactive Patient Education  2019 [employer]

## 2019-01-06 NOTE — Progress Notes (Signed)
Discharge instructions reviewed with patient. AVS given. Patient aware to pick up prescriptions from [facility]. Verbalized understanding of instructions and will call for PCP follow-up appointment. IV site d/c'd this am, site within normal limits. Pt in stable condition awaiting her mother's arrival for discharge home. Earnstine Regal, RN

## 2019-01-06 NOTE — Care Management Important Message (Signed)
Important Message  Patient Details  Name: [name] [name] MRN: [id number] Date of Birth: 1961/08/27   Medicare Important Message Given:  Yes    Malcolm Metro, RN 01/06/2019, 9:08 AM

## 2019-01-06 NOTE — Progress Notes (Signed)
Patient left floor in stable condition via w/c accompanied by nurse tech for discharge home. Bobbyjoe Pabst, RN 

## 2019-01-14 DIAGNOSIS — I25119 Atherosclerotic heart disease of native coronary artery with unspecified angina pectoris: Secondary | ICD-10-CM | POA: Diagnosis not present

## 2019-01-14 DIAGNOSIS — J189 Pneumonia, unspecified organism: Secondary | ICD-10-CM | POA: Diagnosis not present

## 2019-01-14 DIAGNOSIS — J111 Influenza due to unidentified influenza virus with other respiratory manifestations: Secondary | ICD-10-CM | POA: Diagnosis not present

## 2019-01-14 DIAGNOSIS — M545 Low back pain: Secondary | ICD-10-CM | POA: Diagnosis not present

## 2019-01-21 ENCOUNTER — Other Ambulatory Visit: Payer: Self-pay | Admitting: Cardiology

## 2019-01-21 ENCOUNTER — Ambulatory Visit (INDEPENDENT_AMBULATORY_CARE_PROVIDER_SITE_OTHER): Payer: Medicare PPO | Admitting: Pharmacist

## 2019-01-21 DIAGNOSIS — Z5181 Encounter for therapeutic drug level monitoring: Secondary | ICD-10-CM

## 2019-01-21 DIAGNOSIS — D6861 Antiphospholipid syndrome: Secondary | ICD-10-CM

## 2019-01-21 DIAGNOSIS — D6859 Other primary thrombophilia: Secondary | ICD-10-CM | POA: Diagnosis not present

## 2019-01-21 DIAGNOSIS — I679 Cerebrovascular disease, unspecified: Secondary | ICD-10-CM | POA: Diagnosis not present

## 2019-01-21 DIAGNOSIS — I63549 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: Secondary | ICD-10-CM | POA: Diagnosis not present

## 2019-01-21 LAB — POCT INR: INR: 2.8 (ref 2.0–3.0)

## 2019-01-21 NOTE — Patient Instructions (Signed)
Description   Continue coumadin 1 tablet daily except 1/2 tablet on Mondays, Wednesdays and Fridays Has been diagnosed with Alpha Gal.   Keep greens consistant Recheck in 4 weeks

## 2019-01-25 ENCOUNTER — Telehealth: Payer: Self-pay | Admitting: Cardiology

## 2019-01-25 ENCOUNTER — Ambulatory Visit: Payer: Medicare PPO | Admitting: Cardiology

## 2019-01-25 ENCOUNTER — Encounter: Payer: Self-pay | Admitting: Cardiology

## 2019-01-25 VITALS — BP 109/73 | HR 73 | Ht 66.0 in | Wt 216.4 lb

## 2019-01-25 DIAGNOSIS — D6861 Antiphospholipid syndrome: Secondary | ICD-10-CM | POA: Diagnosis not present

## 2019-01-25 DIAGNOSIS — I6523 Occlusion and stenosis of bilateral carotid arteries: Secondary | ICD-10-CM | POA: Diagnosis not present

## 2019-01-25 DIAGNOSIS — I255 Ischemic cardiomyopathy: Secondary | ICD-10-CM

## 2019-01-25 DIAGNOSIS — I25119 Atherosclerotic heart disease of native coronary artery with unspecified angina pectoris: Secondary | ICD-10-CM

## 2019-01-25 NOTE — Progress Notes (Signed)
Cardiology Office Note  Date: 01/25/2019   ID: [name] MassySusan H [name], DOB 1961-04-15, MRN [id number]  PCP: Kari BaarsHawkins, Edward, MD  Primary Cardiologist: Nona DellSamuel McDowell, MD   Chief Complaint  Patient presents with  . Cardiomyopathy    History of Present Illness: [name] [name] is a 58 y.o. female last seen in June 2019.  She presents for a routine follow-up visit. Record review finds recent hospitalization with influenza A and subsequent community-acquired pneumonia.  She was treated with supportive measures including antibiotics.  She presents today stating that she feels better, still not completely back to baseline.  She had a mechanical fall after she got home and hurt her left wrist, she is using a brace now.  She does not report any progressive angina.  She is on Coumadin with follow-up in the anticoagulation clinic.  Recent INR was 2.8.  She sees Dr. Ladona Ridgelaylor in the device clinic, St. Jude ICD in place.  She denies any device discharges or syncope.  Last echocardiogram was in 2018 as outlined below.  We discussed obtaining a follow-up study.  Past Medical History:  Diagnosis Date  . Aneurysm of internal carotid artery   . Anticardiolipin antibody positive    Chronic Coumadin  . Chronic combined systolic (congestive) and diastolic (congestive) heart failure (HCC)   . Cocaine abuse in remission (HCC)   . Coronary atherosclerosis of native coronary artery    Previous invasive cardiac [place]  Occluded diagonal, Diffuse LAD disease, Occluded Cx, and non obstructive RCA.   . Essential hypertension, benign   . Headache   . History of pneumonia   . Ischemic cardiomyopathy    LVEF 30-35%  . Mixed hyperlipidemia   . PVD (peripheral vascular disease) (HCC)   . Raynaud's phenomenon   . Stroke [facility](HCC) 2007   Total occlusion of the right internal carotid    Past Surgical History:  Procedure Laterality Date  . ABDOMINAL AORTAGRAM N/A 05/25/2014    Procedure: ABDOMINAL Ronny FlurryAORTAGRAM;  Surgeon: Iran OuchMuhammad A Arida, MD;  Location: [facility] CATH LAB;  Service: Cardiovascular;  Laterality: N/A;  . APPENDECTOMY    . CARDIAC DEFIBRILLATOR PLACEMENT     St.Jude ICD  . EP IMPLANTABLE DEVICE N/A 03/01/2016   Procedure: ICD Generator Changeout;  Surgeon: Marinus MawGregg W Taylor, MD;  [facility];  Service: Cardiovascular;  Laterality: N/A;  . L1 corpectomy   12/10  . Laryngeal polyp excision      Current Outpatient Medications  Medication Sig Dispense Refill  . carvedilol (COREG) 12.5 MG tablet TAKE 1 TABLET BY MOUTH TWICE DAILY WITH MEALS 180 tablet 3  . clonazePAM (KLONOPIN) 1 MG tablet Take 1 mg by mouth 4 (four) times daily as needed for anxiety.    . DULoxetine (CYMBALTA) 60 MG capsule Take 60 mg by mouth every morning.    . famotidine (PEPCID) 20 MG tablet Take 1 tablet (20 mg total) by mouth 2 (two) times daily. 60 tablet 1  . furosemide (LASIX) 40 MG tablet TAKE 1 TABLET BY MOUTH DAILY AS NEEDED FOR SWELLING. 30 tablet 6  . furosemide (LASIX) 80 MG tablet TAKE 1 TABLET BY MOUTH DAILY 90 tablet 1  . gabapentin (NEURONTIN) 300 MG capsule Take 300 mg by mouth 2 (two) times daily.    Marland Kitchen. guaiFENesin (MUCINEX) 600 MG 12 hr tablet Take 2 tablets (1,200 mg total) by mouth 2 (two) times daily. 40 tablet 1  . HYDROcodone-acetaminophen (NORCO) 10-325  MG per tablet Take 0.5 tablets by mouth 4 (four) times daily as needed for moderate pain or severe pain. pain    . isosorbide mononitrate (IMDUR) 30 MG 24 hr tablet TAKE 1 TABLET BY MOUTH DAILY 90 tablet 1  . losartan (COZAAR) 25 MG tablet TAKE 1/2 TABLET BY MOUTH EVERY DAY 45 tablet 1  . Multiple Vitamins-Minerals (MULTIVITAMINS THER. W/MINERALS) TABS Take 1 tablet by mouth daily.      . nitroGLYCERIN (NITROSTAT) 0.4 MG SL tablet PLACE 1 TABLET UNDER THE TONGUE EVERY 5 MINUTES FOR 3 DOSES AS NEEDED CHEST PAIN 25 tablet 3  . potassium chloride SA (K-DUR,KLOR-CON) 20 MEQ tablet TAKE 1 TABLET BY MOUTH EVERY  DAY 90 tablet 3  . promethazine (PHENERGAN) 25 MG tablet Take 25 mg by mouth every 6 (six) hours as needed for nausea.    Marland Kitchen spironolactone (ALDACTONE) 25 MG tablet TAKE 1 TABLET BY MOUTH EVERY DAY 90 tablet 1  . warfarin (COUMADIN) 10 MG tablet Take 1 tablet daily except 1/2 tablet on Thursdays (Patient taking differently: Take 5-10 mg by mouth See admin instructions. TAKES 5MG  ON TUESDAYS AND THURSDAYS AND TAKES 10MG  ON ALL OTHER DAYS) 30 tablet 3   No current facility-administered medications for this visit.    Allergies:  Metoclopramide hcl; Penicillins; Metoprolol; and Statins   Social History: The patient  reports that she has been smoking e-cigarettes. She started smoking about 40 years ago. She has a 43.00 pack-year smoking history. She has never used smokeless tobacco. She reports that she does not drink alcohol or use drugs.   ROS:  Please see the history of present illness. Otherwise, complete review of systems is positive for NYHA class II dyspnea.  All other systems are reviewed and negative.   Physical Exam: VS:  BP 109/73   Pulse 73   Ht 5\' 6"  (1.676 m)   Wt 216 lb 6.4 oz (98.2 kg)   SpO2 98%   BMI 34.93 kg/m , BMI Body mass index is 34.93 kg/m.  Wt Readings from Last 3 Encounters:  01/25/19 216 lb 6.4 oz (98.2 kg)  12/31/18 224 lb (101.6 kg)  12/27/18 210 lb (95.3 kg)    General: Patient appears comfortable at rest. HEENT: Conjunctiva and lids normal, oropharynx clear. Neck: Supple, no elevated JVP or carotid bruits, no thyromegaly. Lungs: Clear to auscultation, nonlabored breathing at rest. Cardiac: Regular rate and rhythm, no S3 or significant systolic murmur. Abdomen: Soft, nontender, bowel sounds present. Extremities: Mild ankle edema, distal pulses 2+. Skin: Warm and dry. Musculoskeletal: No kyphosis.  Brace on left wrist. Neuropsychiatric: Alert and oriented x3, affect grossly appropriate.  ECG: I personally reviewed the tracing from 12/31/2018 which showed  sinus rhythm with old anteroseptal/high lateral infarct pattern and diffuse nonspecific ST abnormalities.  Recent Labwork: 12/25/2018: ALT 29; AST 37 12/31/2018: B Natriuretic Peptide 523.0 01/01/2019: Magnesium 2.0 01/05/2019: Hemoglobin 12.3; Platelets 306 01/06/2019: BUN 15; Creatinine, Ser 0.68; Potassium 4.2; Sodium 140     Component Value Date/Time   CHOL 147 06/19/2017 0226   TRIG 97 06/19/2017 0226   HDL 31 (L) 06/19/2017 0226   CHOLHDL 4.7 06/19/2017 0226   VLDL 19 06/19/2017 0226   LDLCALC 97 06/19/2017 0226    Other Studies Reviewed Today:  Echocardiogram 06/19/2017: Left ventricle: LVEF is approximately 30% with severe hypokinesis / akinesis fo the inferior , distal anterior, and apical walls The cavity size was severely dilated. Wall thickness was normal. Doppler parameters are consistent with abnormal left ventricular  relaxation (grade 1 diastolic dysfunction).  ------------------------------------------------------------------- Aortic valve: Structurally normal valve. Cusp separation was normal. Doppler: Transvalvular velocity was within the normal range. There was no stenosis. There was no regurgitation.  ------------------------------------------------------------------- Mitral valve: Structurally normal valve. Leaflet separation was normal. Doppler: Transvalvular velocity was within the normal range. There was no evidence for stenosis. There was mild regurgitation. Peak gradient (D): 3 mm Hg.  ------------------------------------------------------------------- Left atrium: The atrium was mildly dilated.  ------------------------------------------------------------------- Right ventricle: The cavity size was normal. Wall thickness was normal. Pacer wire or catheter noted in right ventricle. Systolic function was normal.  ------------------------------------------------------------------- Tricuspid valve: Structurally normal valve.  Leaflet separation was normal. Doppler: Transvalvular velocity was within the normal range. There was no regurgitation.  ------------------------------------------------------------------- Right atrium: The atrium was normal in size.  ------------------------------------------------------------------- Pericardium: There was no pericardial effusion.  ------------------------------------------------------------------- Systemic veins: Inferior vena cava: The vessel was mildly dilated. The respirophasic diameter changes were in the normal range (= 50%), consistent with elevated central venous pressure.  Assessment and Plan:  1.  Ischemic cardiomyopathy with chronic combined heart failure.  Plan to obtain a follow-up echocardiogram for reevaluation.  We have talked about possibly switching from Cozaar to [facility] although her blood pressure is generally low normal to low.  We might consider this conversion once results are reviewed, could possibly stop Imdur and otherwise maintain her standing regimen.  2.  CAD with stable angina.  3.  Bilateral carotid artery disease with known occlusion of the RICA.  4.  Hypercoagulable state with anticardiolipin antibody.  She continues on Coumadin with follow-up in the anticoagulation clinic.  Current medicines were reviewed with the patient today.   Orders Placed This Encounter  Procedures  . ECHOCARDIOGRAM COMPLETE    Disposition: Follow-up in 6 months.  Signed, [name], MD, [facility] 01/25/2019 1:47 PM    [facility] at [facility] [address] [place], [place], [place] [address] Phone: [phone]; Fax: [phone]

## 2019-01-25 NOTE — Telephone Encounter (Signed)
°  Precert needed for: 6 mth f/u 2 D Echo dx: ischemic cardiomyopathy   Location: [facility]    Date: Feb 18, 2019

## 2019-01-25 NOTE — Patient Instructions (Addendum)
Medication Instructions:   Your physician recommends that you continue on your current medications as directed. Please refer to the Current Medication list given to you today.  Labwork:  NONE  Testing/Procedures: Your physician has requested that you have an echocardiogram. Echocardiography is a painless test that uses sound waves to create images of your heart. It provides your doctor with information about the size and shape of your heart and how well your heart's chambers and valves are working. This procedure takes approximately one hour. There are no restrictions for this procedure.  Follow-Up:  Your physician recommends that you schedule a follow-up appointment in: 6 months. You will receive a reminder letter in the mail in about 4 months reminding you to call and schedule your appointment. If you don't receive this letter, please contact our office.  Any Other Special Instructions Will Be Listed Below (If Applicable).  If you need a refill on your cardiac medications before your next appointment, please call your pharmacy. 

## 2019-01-26 ENCOUNTER — Inpatient Hospital Stay (HOSPITAL_COMMUNITY): Payer: Medicare PPO | Attending: Internal Medicine | Admitting: Internal Medicine

## 2019-01-26 ENCOUNTER — Other Ambulatory Visit: Payer: Self-pay

## 2019-01-26 ENCOUNTER — Encounter (HOSPITAL_COMMUNITY): Payer: Self-pay | Admitting: Internal Medicine

## 2019-01-26 VITALS — BP 120/71 | HR 78 | Temp 97.6°F | Resp 18 | Wt 213.9 lb

## 2019-01-26 DIAGNOSIS — D696 Thrombocytopenia, unspecified: Secondary | ICD-10-CM | POA: Insufficient documentation

## 2019-01-26 DIAGNOSIS — F17290 Nicotine dependence, other tobacco product, uncomplicated: Secondary | ICD-10-CM

## 2019-01-26 DIAGNOSIS — Z7901 Long term (current) use of anticoagulants: Secondary | ICD-10-CM | POA: Diagnosis not present

## 2019-01-26 DIAGNOSIS — I10 Essential (primary) hypertension: Secondary | ICD-10-CM | POA: Insufficient documentation

## 2019-01-26 DIAGNOSIS — I4891 Unspecified atrial fibrillation: Secondary | ICD-10-CM | POA: Diagnosis not present

## 2019-01-26 DIAGNOSIS — R509 Fever, unspecified: Secondary | ICD-10-CM | POA: Diagnosis not present

## 2019-01-26 DIAGNOSIS — Z79899 Other long term (current) drug therapy: Secondary | ICD-10-CM | POA: Diagnosis not present

## 2019-01-26 DIAGNOSIS — R05 Cough: Secondary | ICD-10-CM

## 2019-01-26 NOTE — Progress Notes (Signed)
Diagnosis Thrombocytopenia (HCC) - Plan: CBC with Differential/Platelet, Comprehensive metabolic panel, Lactate dehydrogenase, Ferritin, CBC with Differential/Platelet, Comprehensive metabolic panel, Lactate dehydrogenase  Staging Cancer Staging No matching staging information was found for the patient.  Assessment and Plan:  1. Thrombocytopenia. Pt is seen for hospital follow-up.  Pt is a 58 year old female admitted 12/23/2018 by Dr. [name] due to fever with productive cough. She was reportedly at a family gathering with several sick family members. She subsequently developed fever and aches. CXR done 12/24/2018 showed heart failure versus pneumonia.  She had flu testing done on 12/23/2018 that was + for influenza A. HIV testing was negative. Blood cultures obtained 12/23/2018 showed no growth.   Pt was not treated with heparin. She was on chronic coumadin. Pt has been given ASA.   Labs done 12/23/2018 showed WBC 6.3 HB 14.3 plts 50,000. She has a normal differential. Chemistries WNL with K+ 3.6. Cr 0.76 and normal LFTs. PT 18.7 with  INR of 1.6.   Pt had repeat labs done 12/25/2018 that showed WBC 9.1 HB 13.7 plts 20,000. Chemistries WNL with K+ 3.6 Cr 0.72 and normal LFTs. LDH 156, PTT 60. Smear review showed thrombocytopenia.   Suspect etiology due to infection. She was transfused with platelets and was recommended to continue to support with plt transfusions if plt count less than 20,000 or bleeding. Pt was also recommended to discontinue ASA until plt count improved greater than 50,000.   She was discharged 12/26/2018.  Labs done 12/31/2018 showed WBC 14 HB 11.9 and plts 370,000.    Recent blood work done 01/05/2019 reviewed and showed WBC 7.1 HB 12.3 plts 306,000.  Counts have recovered after infection.  Pt will have repeat labs in 03/2019.    2. Influenza and pneumonia.  Pt followed by Dr. [name].  She is afebrile in clinic with normal WBC 7.1.  Follow-up  with pulmonary as recommended.    3. Atrial fibrillation. Pt is on coumadin and should continue coumadin monitoring through PCP or cardiology.   4.  CVA.  Pt reports she was followed at CVA clinic at [facility].  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  Follow-up with neurology as recommended.    5. HTN. BP is 120/71. Follow-up with PCP.   6. Smoking/abnormal CXR. Pt has long smoking history. CXR done 11/2019 showed heart failure versus pneumonia. Pt had CT chest done 12/31/2018 that was reviewed and showed  IMPRESSION: 1. Extensive patchy consolidation and ground-glass opacity in the dependent lower lobes bilaterally, with less prominent patchy peribronchovascular consolidation and ground-glass opacity throughout the remaining lungs. Favor multilobar infectious pneumonia. Suggest follow-up chest imaging in 1-2 months to document resolution. 2. Mild cardiomegaly. 3. Mild mediastinal and bilateral hilar adenopathy, nonspecific, most likely reactive. 4. Three-vessel coronary atherosclerosis.  Pt  is followed by Dr. [name]. Cessation recommended. Pt should have repeatImaging done by Dr. [name] for ongoing follow-up of lung findings due to history of smoking and pneumonia and influenza.    25  minutes spent with more than 50% in review of records, counseling and coordination of care.   Interval History:  Historical data obtained from note dated 12/25/2018:  1. Thrombocytopenia. 58 year old female admitted 12/23/2018 by Dr. [name] due to fever with productive cough. She was reportedly  At a family gathering with several sick family members. She subsequently developed fever and aches. CXR done 12/24/2018 showed heart failure versus pneumonia.  She had flu testing done on 12/23/2018 that was +  for influenza A. HIV testing was negative. Blood cultures obtained 12/23/2018 showed no growth.   Pt was not treated with heparin. She was on chronic coumadin. Pt  has been given ASA.   Labs done 12/23/2018 showed WBC 6.3 HB 14.3 plts 50,000. She has a normal differential. Chemistries WNL with K+ 3.6. Cr 0.76 and normal LFTs. PT 18.7 with  INR of 1.6.   Pt had repeat labs done 12/25/2018 that showed WBC 9.1 HB 13.7 plts 20,000. Chemistries WNL with K+ 3.6 Cr 0.72 and normal LFTs. LDH 156, PTT 60. Smear review shows thrombocytopenia.   Hematology consulted for evaluation due to thrombocytopenia.    Current Status:  Pt is seen today for hospital follow-up.  She is here to go over recent labs.     Problem List Patient Active Problem List   Diagnosis Date Noted  . SIRS (systemic inflammatory response syndrome) (HCC) [R65.10] 01/06/2019  . HCAP (healthcare-associated pneumonia) [J18.9] 12/31/2018  . H. influenzae infection [A49.2] 12/24/2018  . Influenza A [J10.1] 12/24/2018  . Chest pain [R07.9] 06/18/2017  . Hyponatremia [E87.1] 06/18/2017  . Tobacco dependence [F17.200] 06/18/2017  . 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) [I63.549]   . Left-sided weakness [R53.1]   . Chronic combined systolic (congestive) and diastolic (congestive) heart failure (HCC) [I50.42] 07/26/2015  . Encounter for therapeutic drug monitoring [Z51.81] 02/02/2014  . Peripheral arterial disease (HCC) [I73.9] 11/09/2013  . Dual implantable cardioverter-defibrillator in situ [Z95.810] 06/01/2012  . Long term current use of anticoagulant [Z79.01] 03/22/2011  . Essential hypertension, benign [I10] 05/29/2010  . CHEST PAIN [R07.9] 05/29/2010  . Hyperlipidemia [E78.5] 09/26/2009  . Peripheral vascular disease (HCC) [I73.9] 06/23/2009  . Cardiomyopathy, ischemic [I25.5] 11/29/2008  . CEREBROVASCULAR DISEASE [I67.9] 11/29/2008  . 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 Aneurysm of internal carotid artery   . Anticardiolipin  antibody positive    Chronic Coumadin  . Chronic combined systolic (congestive) and diastolic (congestive) heart failure (HCC)   . Cocaine abuse in remission (HCC)   . Coronary atherosclerosis of native coronary artery    Previous [place] cardiac [place]  Occluded diagonal, Diffuse LAD disease, Occluded Cx, and non obstructive RCA.   . Essential hypertension, benign   . Headache   . History of pneumonia   . Ischemic cardiomyopathy    LVEF 30-35%  . Mixed hyperlipidemia   . PVD (peripheral vascular disease) (HCC)   . Raynaud's phenomenon   . Stroke [facility](HCC) 2007   Total occlusion of the right internal carotid    Past Surgical History Past Surgical History:  Procedure Laterality Date  . ABDOMINAL AORTAGRAM N/A 05/25/2014   Procedure: ABDOMINAL Ronny FlurryAORTAGRAM;  Surgeon: Iran OuchMuhammad A Arida, MD;  Location: [facility] CATH LAB;  Service: Cardiovascular;  Laterality: N/A;  . APPENDECTOMY    . CARDIAC DEFIBRILLATOR PLACEMENT     St.Jude ICD  . EP IMPLANTABLE DEVICE N/A 03/01/2016   Procedure: ICD Generator Changeout;  Surgeon: Marinus MawGregg W Taylor, MD;  [facility];  Service: Cardiovascular;  Laterality: N/A;  . L1 corpectomy   12/10  . Laryngeal polyp excision      Family History Family History  Problem Relation Age of Onset  . Heart disease Father [id number]  . Ankylosing spondylitis Sister   . Stomach cancer Brother 2458  . Heart attack Brother  Social History  reports that she has been smoking e-cigarettes. She started smoking about 40 years ago. She has a 43.00 pack-year smoking history. She has never used smokeless tobacco. She reports that she does not drink alcohol or use drugs.  Medications  Current Outpatient Medications:  .  carvedilol (COREG) 12.5 MG tablet, TAKE 1 TABLET BY MOUTH TWICE DAILY WITH MEALS, Disp: 180 tablet, Rfl: 3 .  clonazePAM (KLONOPIN) 1 MG tablet, Take 1 mg by mouth 4 (four) times daily as needed for anxiety., Disp:  , Rfl:  .  DULoxetine (CYMBALTA) 60 MG capsule, Take 60 mg by mouth every morning., Disp: , Rfl:  .  famotidine (PEPCID) 20 MG tablet, Take 1 tablet (20 mg total) by mouth 2 (two) times daily., Disp: 60 tablet, Rfl: 1 .  furosemide (LASIX) 40 MG tablet, TAKE 1 TABLET BY MOUTH DAILY AS NEEDED FOR SWELLING., Disp: 30 tablet, Rfl: 6 .  furosemide (LASIX) 80 MG tablet, TAKE 1 TABLET BY MOUTH DAILY, Disp: 90 tablet, Rfl: 1 .  gabapentin (NEURONTIN) 300 MG capsule, Take 300 mg by mouth 2 (two) times daily., Disp: , Rfl:  .  guaiFENesin (MUCINEX) 600 MG 12 hr tablet, Take 2 tablets (1,200 mg total) by mouth 2 (two) times daily., Disp: 40 tablet, Rfl: 1 .  HYDROcodone-acetaminophen (NORCO) 10-325 MG per tablet, Take 0.5 tablets by mouth 4 (four) times daily as needed for moderate pain or severe pain. pain, Disp: , Rfl:  .  isosorbide mononitrate (IMDUR) 30 MG 24 hr tablet, TAKE 1 TABLET BY MOUTH DAILY, Disp: 90 tablet, Rfl: 1 .  losartan (COZAAR) 25 MG tablet, TAKE 1/2 TABLET BY MOUTH EVERY DAY, Disp: 45 tablet, Rfl: 1 .  Multiple Vitamins-Minerals (MULTIVITAMINS THER. W/MINERALS) TABS, Take 1 tablet by mouth daily.  , Disp: , Rfl:  .  potassium chloride SA (K-DUR,KLOR-CON) 20 MEQ tablet, TAKE 1 TABLET BY MOUTH EVERY DAY, Disp: 90 tablet, Rfl: 3 .  promethazine (PHENERGAN) 25 MG tablet, Take 25 mg by mouth every 6 (six) hours as needed for nausea., Disp: , Rfl:  .  spironolactone (ALDACTONE) 25 MG tablet, TAKE 1 TABLET BY MOUTH EVERY DAY, Disp: 90 tablet, Rfl: 1 .  warfarin (COUMADIN) 10 MG tablet, Take 1 tablet daily except 1/2 tablet on Thursdays (Patient taking differently: Take 5-10 mg by mouth See admin instructions. TAKES 5MG  ON TUESDAYS AND THURSDAYS AND TAKES 10MG  ON ALL OTHER DAYS), Disp: 30 tablet, Rfl: 3 .  nitroGLYCERIN (NITROSTAT) 0.4 MG SL tablet, PLACE 1 TABLET UNDER THE TONGUE EVERY 5 MINUTES FOR 3 DOSES AS NEEDED CHEST PAIN (Patient not taking: Reported on 01/26/2019), Disp: 25 tablet, Rfl:  3  Allergies Metoclopramide hcl; Penicillins; Metoprolol; and Statins  Review of Systems Review of Systems - Oncology ROS negative other than related to comorbidity   Physical Exam  Vitals Wt Readings from Last 3 Encounters:  01/26/19 213 lb 14.4 oz (97 kg)  01/25/19 216 lb 6.4 oz (98.2 kg)  12/31/18 224 lb (101.6 kg)   Temp Readings from Last 3 Encounters:  01/26/19 97.6 F (36.4 C) (Oral)  01/06/19 97.8 F (36.6 C) (Oral)  12/27/18 98.3 F (36.8 C) (Oral)   BP Readings from Last 3 Encounters:  01/26/19 120/71  01/25/19 109/73  01/06/19 121/63   Pulse Readings from Last 3 Encounters:  01/26/19 78  01/25/19 73  01/06/19 74   Constitutional: Well-developed, well-nourished, and in no distress.   HENT: Head: Normocephalic and atraumatic.  Mouth/Throat: No oropharyngeal exudate. Mucosa  moist. Eyes: Pupils are equal, round, and reactive to light. Conjunctivae are normal. No scleral icterus.  Neck: Normal range of motion. Neck supple. No JVD present.  Cardiovascular: Normal rate, regular rhythm and normal heart sounds.  Exam reveals no gallop and no friction rub.   No murmur heard. Pulmonary/Chest: Effort normal.  Coarse breath sounds. No respiratory distress.   Abdominal: Soft. Bowel sounds are normal. No distension. There is no tenderness. There is no guarding.  Musculoskeletal: No edema or tenderness.  Lymphadenopathy: No cervical, axillary or supraclavicular adenopathy.  Neurological: Alert and oriented to person, place, and time. No cranial nerve deficit.  Skin: Skin is warm and dry. No rash noted. No erythema. No pallor.  Psychiatric: Affect and judgment normal.   Labs No visits with results within 3 Day(s) from this visit.  Latest known visit with results is:  Anti-coag visit on 01/21/2019  Component Date Value Ref Range Status  . INR 01/21/2019 2.8  2.0 - 3.0 Final     Pathology Orders Placed This Encounter  Procedures  . CBC with Differential/Platelet     Standing Status:   Future    Standing Expiration Date:   01/27/2020  . Comprehensive metabolic panel    Standing Status:   Future    Standing Expiration Date:   01/27/2020  . Lactate dehydrogenase    Standing Status:   Future    Standing Expiration Date:   01/27/2020  . Ferritin    Standing Status:   Future    Standing Expiration Date:   01/27/2020  . CBC with Differential/Platelet    Standing Status:   Future    Standing Expiration Date:   01/27/2020  . Comprehensive metabolic panel    Standing Status:   Future    Standing Expiration Date:   01/27/2020  . Lactate dehydrogenase    Standing Status:   Future    Standing Expiration Date:   01/27/2020       Ahmed Prima MD

## 2019-01-28 ENCOUNTER — Ambulatory Visit (HOSPITAL_COMMUNITY)
Admission: RE | Admit: 2019-01-28 | Discharge: 2019-01-28 | Disposition: A | Discharge status: Home / Self Care | Payer: Medicare PPO | Source: Ambulatory Visit | Attending: Pulmonary Disease | Admitting: Pulmonary Disease

## 2019-01-28 ENCOUNTER — Other Ambulatory Visit (HOSPITAL_COMMUNITY): Payer: Self-pay | Admitting: Pulmonary Disease

## 2019-01-28 DIAGNOSIS — M79602 Pain in left arm: Secondary | ICD-10-CM | POA: Diagnosis not present

## 2019-01-28 DIAGNOSIS — S52135A Nondisplaced fracture of neck of left radius, initial encounter for closed fracture: Secondary | ICD-10-CM | POA: Diagnosis not present

## 2019-02-02 ENCOUNTER — Ambulatory Visit: Payer: Medicare PPO | Admitting: Orthopaedic Surgery

## 2019-02-02 ENCOUNTER — Encounter: Payer: Self-pay | Admitting: Orthopaedic Surgery

## 2019-02-02 VITALS — BP 130/82 | HR 87 | Ht 66.0 in | Wt 220.0 lb

## 2019-02-02 DIAGNOSIS — S52135A Nondisplaced fracture of neck of left radius, initial encounter for closed fracture: Secondary | ICD-10-CM | POA: Diagnosis not present

## 2019-02-02 NOTE — Progress Notes (Signed)
Subjective:    Patient ID: [name] [name], female    DOB: 1961-11-12, 58 y.o.   MRN: [id number]  HPI She fell about three weeks ago and hurt her left elbow and arm.  She felt it would get better.  It did not. She decided to have it checked. She had x-rays done 01-28-2019 by Dr. [name].  X-rays showed: IMPRESSION: Nondisplaced left radial neck fracture with associated left elbow joint effusion  She has no other pains.  She has very high pain tolerance.  I have reviewed the x-rays and report.   Review of Systems  Constitutional: Positive for activity change.  Musculoskeletal: Positive for arthralgias and back pain.  Neurological: Positive for headaches.  All other systems reviewed and are negative.  For Review of Systems, all other systems reviewed and are negative.  The following is a summary of the past history medically, past history surgically, known current medicines, social history and family history.  This information is gathered electronically by the computer from prior information and documentation.  I review this each visit and have found including this information at this point in the chart is beneficial and informative.   Past Medical History:  Diagnosis Date  . Aneurysm of internal carotid artery   . Anticardiolipin antibody positive    Chronic Coumadin  . Chronic combined systolic (congestive) and diastolic (congestive) heart failure (HCC)   . Cocaine abuse in remission (HCC)   . Coronary atherosclerosis of native coronary artery    Previous invasive cardiac testing was done at a facility in [place], [place].  Occluded diagonal, Diffuse LAD disease, Occluded Cx, and non obstructive RCA.   . Essential hypertension, benign   . Headache   . History of pneumonia   . Ischemic cardiomyopathy    LVEF 30-35%  . Mixed hyperlipidemia   . PVD (peripheral vascular disease) (HCC)   . Raynaud's phenomenon   . Stroke [facility]) 2007   Total occlusion of the right internal  carotid    Past Surgical History:  Procedure Laterality Date  . ABDOMINAL AORTAGRAM N/A 05/25/2014   Procedure: ABDOMINAL Ronny Flurry;  Surgeon: Iran Ouch, MD;  Location: [facility] CATH LAB;  Service: Cardiovascular;  Laterality: N/A;  . APPENDECTOMY    . CARDIAC DEFIBRILLATOR PLACEMENT     St.Jude ICD  . EP IMPLANTABLE DEVICE N/A 03/01/2016   Procedure: ICD Generator Changeout;  Surgeon: [name], MD;  Location: [facility] INVASIVE CV LAB;  Service: Cardiovascular;  Laterality: N/A;  . L1 corpectomy   12/10  . Laryngeal polyp excision      Current Outpatient Medications on File Prior to Visit  Medication Sig Dispense Refill  . carvedilol (COREG) 12.5 MG tablet TAKE 1 TABLET BY MOUTH TWICE DAILY WITH MEALS 180 tablet 3  . clonazePAM (KLONOPIN) 1 MG tablet Take 1 mg by mouth 4 (four) times daily as needed for anxiety.    . DULoxetine (CYMBALTA) 60 MG capsule Take 60 mg by mouth every morning.    . famotidine (PEPCID) 20 MG tablet Take 1 tablet (20 mg total) by mouth 2 (two) times daily. 60 tablet 1  . furosemide (LASIX) 40 MG tablet TAKE 1 TABLET BY MOUTH DAILY AS NEEDED FOR SWELLING. 30 tablet 6  . furosemide (LASIX) 80 MG tablet TAKE 1 TABLET BY MOUTH DAILY 90 tablet 1  . gabapentin (NEURONTIN) 300 MG capsule Take 300 mg by mouth 2 (two) times daily.    Marland Kitchen guaiFENesin (MUCINEX) 600 MG 12 hr tablet Take  2 tablets (1,200 mg total) by mouth 2 (two) times daily. 40 tablet 1  . HYDROcodone-acetaminophen (NORCO) 10-325 MG per tablet Take 0.5 tablets by mouth 4 (four) times daily as needed for moderate pain or severe pain. pain    . isosorbide mononitrate (IMDUR) 30 MG 24 hr tablet TAKE 1 TABLET BY MOUTH DAILY 90 tablet 1  . losartan (COZAAR) 25 MG tablet TAKE 1/2 TABLET BY MOUTH EVERY DAY 45 tablet 1  . Multiple Vitamins-Minerals (MULTIVITAMINS THER. W/MINERALS) TABS Take 1 tablet by mouth daily.      . nitroGLYCERIN (NITROSTAT) 0.4 MG SL tablet PLACE 1 TABLET UNDER THE TONGUE EVERY 5 MINUTES FOR 3  DOSES AS NEEDED CHEST PAIN (Patient not taking: Reported on 01/26/2019) 25 tablet 3  . potassium chloride SA (K-DUR,KLOR-CON) 20 MEQ tablet TAKE 1 TABLET BY MOUTH EVERY DAY 90 tablet 3  . promethazine (PHENERGAN) 25 MG tablet Take 25 mg by mouth every 6 (six) hours as needed for nausea.    Marland Kitchen spironolactone (ALDACTONE) 25 MG tablet TAKE 1 TABLET BY MOUTH EVERY DAY 90 tablet 1  . warfarin (COUMADIN) 10 MG tablet Take 1 tablet daily except 1/2 tablet on Thursdays (Patient taking differently: Take 5-10 mg by mouth See admin instructions. TAKES 5MG  ON TUESDAYS AND THURSDAYS AND TAKES 10MG  ON ALL OTHER DAYS) 30 tablet 3   No current facility-administered medications on file prior to visit.     Social History   Socioeconomic History  . Marital status: Single    Spouse name: Not on file  . Number of children: Not on file  . Years of education: Not on file  . Highest education level: Not on file  Occupational History  . Occupation: Disabled    Comment: ESL  Social Needs  . Financial resource strain: Not on file  . Food insecurity:    Worry: Not on file    Inability: Not on file  . Transportation needs:    Medical: Not on file    Non-medical: Not on file  Tobacco Use  . Smoking status: Current Every Day Smoker    Packs/day: 1.00    Years: 43.00    Pack years: 43.00    Types: E-cigarettes    Start date: 03/03/1978    Last attempt to quit: 10/03/2013    Years since quitting: 5.3  . Smokeless tobacco: Never Used  . Tobacco comment: resumed cigarettes, stopped last week but continues to vape  Substance and Sexual Activity  . Alcohol use: No    Alcohol/week: 0.0 standard drinks  . Drug use: No    Comment: Prior history of cocaine  . Sexual activity: Yes    Birth control/protection: None  Lifestyle  . Physical activity:    Days per week: Not on file    Minutes per session: Not on file  . Stress: Not on file  Relationships  . Social connections:    Talks on phone: Not on file    Gets  together: Not on file    Attends religious service: Not on file    Active member of club or organization: Not on file    Attends meetings of clubs or organizations: Not on file    Relationship status: Not on file  . Intimate partner violence:    Fear of current or ex partner: Not on file    Emotionally abused: Not on file    Physically abused: Not on file    Forced sexual activity: Not on file  Other  Topics Concern  . Not on file  Social History Narrative  . Not on file    Family History  Problem Relation Age of Onset  . Heart disease Father 35  . Ankylosing spondylitis Sister   . Stomach cancer Brother 108  . Heart attack Brother     BP 130/82   Pulse 87   Ht 5\' 6"  (1.676 m)   Wt 220 lb (99.8 kg)   BMI 35.51 kg/m   Body mass index is 35.51 kg/m.      Objective:   Physical Exam Constitutional:      Appearance: She is well-developed.  HENT:     Head: Normocephalic and atraumatic.  Eyes:     Conjunctiva/sclera: Conjunctivae normal.     Pupils: Pupils are equal, round, and reactive to light.  Neck:     Musculoskeletal: Normal range of motion and neck supple.  Cardiovascular:     Rate and Rhythm: Normal rate and regular rhythm.  Pulmonary:     Effort: Pulmonary effort is normal.  Abdominal:     Palpations: Abdomen is soft.  Musculoskeletal:     Left elbow: She exhibits decreased range of motion. Tenderness found.       Arms:  Skin:    General: Skin is warm and dry.  Neurological:     Mental Status: She is alert and oriented to person, place, and time.     Cranial Nerves: No cranial nerve deficit.     Motor: No abnormal muscle tone.     Coordination: Coordination normal.     Deep Tendon Reflexes: Reflexes are normal and symmetric. Reflexes normal.  Psychiatric:        Behavior: Behavior normal.        Thought Content: Thought content normal.        Judgment: Judgment normal.           Assessment & Plan:   Encounter Diagnosis  Name Primary?  .  Closed nondisplaced fracture of neck of left radius, initial encounter Yes   She is placed in a long arm posterior splint.  Return in two weeks.  X-rays then out of the splint.  Call if any problem.  Precautions discussed.   Electronically Signed Darreld Mclean, MD 2/4/20209:43 AM

## 2019-02-10 DIAGNOSIS — F419 Anxiety disorder, unspecified: Secondary | ICD-10-CM | POA: Diagnosis not present

## 2019-02-10 DIAGNOSIS — M25562 Pain in left knee: Secondary | ICD-10-CM | POA: Diagnosis not present

## 2019-02-10 DIAGNOSIS — M170 Bilateral primary osteoarthritis of knee: Secondary | ICD-10-CM | POA: Diagnosis not present

## 2019-02-10 DIAGNOSIS — J09X1 Influenza due to identified novel influenza A virus with pneumonia: Secondary | ICD-10-CM | POA: Diagnosis not present

## 2019-02-10 DIAGNOSIS — M25561 Pain in right knee: Secondary | ICD-10-CM | POA: Diagnosis not present

## 2019-02-10 DIAGNOSIS — I25119 Atherosclerotic heart disease of native coronary artery with unspecified angina pectoris: Secondary | ICD-10-CM | POA: Diagnosis not present

## 2019-02-16 ENCOUNTER — Ambulatory Visit (INDEPENDENT_AMBULATORY_CARE_PROVIDER_SITE_OTHER): Payer: Medicare PPO | Admitting: Orthopaedic Surgery

## 2019-02-16 ENCOUNTER — Ambulatory Visit (INDEPENDENT_AMBULATORY_CARE_PROVIDER_SITE_OTHER): Payer: Medicare PPO

## 2019-02-16 ENCOUNTER — Telehealth: Payer: Self-pay | Admitting: Orthopaedic Surgery

## 2019-02-16 ENCOUNTER — Encounter: Payer: Self-pay | Admitting: Orthopaedic Surgery

## 2019-02-16 DIAGNOSIS — M25532 Pain in left wrist: Secondary | ICD-10-CM

## 2019-02-16 DIAGNOSIS — S52135D Nondisplaced fracture of neck of left radius, subsequent encounter for closed fracture with routine healing: Secondary | ICD-10-CM

## 2019-02-16 NOTE — Telephone Encounter (Signed)
[facility] called asking for the order for PT be changed to Occupational Therapy. Can call at [phone] or fax# [phone]

## 2019-02-16 NOTE — Progress Notes (Signed)
CC:  My wrist hurts  She is doing well with the left elbow but has wrist pain with supination.  She wants an x-ray of the wrist.  X-rays were done of the left elbow and wrist, reported separately.  NV intact.  She lacks 10 degrees of full extension of the left elbow and 15 degrees of full supination.  Encounter Diagnoses  Name Primary?  . Closed nondisplaced fracture of neck of left radius with routine healing, subsequent encounter Yes  . Wrist pain, acute, left    Begin PT  Return in three weeks.  Call if any problem.  Precautions discussed.   Electronically Signed Darreld Mclean, MD 2/18/202011:03 AM

## 2019-02-17 ENCOUNTER — Other Ambulatory Visit: Payer: Medicare PPO

## 2019-02-17 ENCOUNTER — Other Ambulatory Visit: Payer: Self-pay | Admitting: Cardiology

## 2019-02-17 NOTE — Addendum Note (Signed)
Addended by: Baird Kay on: 02/17/2019 10:32 AM   Modules accepted: Orders

## 2019-02-18 ENCOUNTER — Ambulatory Visit (INDEPENDENT_AMBULATORY_CARE_PROVIDER_SITE_OTHER): Payer: Medicare PPO

## 2019-02-18 ENCOUNTER — Other Ambulatory Visit: Payer: Self-pay | Admitting: *Deleted

## 2019-02-18 ENCOUNTER — Ambulatory Visit (INDEPENDENT_AMBULATORY_CARE_PROVIDER_SITE_OTHER): Payer: Medicare PPO | Admitting: *Deleted

## 2019-02-18 ENCOUNTER — Telehealth: Payer: Self-pay | Admitting: *Deleted

## 2019-02-18 DIAGNOSIS — D6861 Antiphospholipid syndrome: Secondary | ICD-10-CM

## 2019-02-18 DIAGNOSIS — I63549 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: Secondary | ICD-10-CM

## 2019-02-18 DIAGNOSIS — Z5181 Encounter for therapeutic drug level monitoring: Secondary | ICD-10-CM | POA: Diagnosis not present

## 2019-02-18 DIAGNOSIS — I5042 Chronic combined systolic (congestive) and diastolic (congestive) heart failure: Secondary | ICD-10-CM

## 2019-02-18 DIAGNOSIS — D6859 Other primary thrombophilia: Secondary | ICD-10-CM

## 2019-02-18 DIAGNOSIS — I679 Cerebrovascular disease, unspecified: Secondary | ICD-10-CM

## 2019-02-18 DIAGNOSIS — I255 Ischemic cardiomyopathy: Secondary | ICD-10-CM | POA: Diagnosis not present

## 2019-02-18 LAB — POCT INR: INR: 2.8 (ref 2.0–3.0)

## 2019-02-18 MED ORDER — SACUBITRIL-VALSARTAN 24-26 MG PO TABS: 1.0000 | ORAL_TABLET | Freq: Two times a day (BID) | ORAL | 6 refills | Status: DC

## 2019-02-18 NOTE — Patient Instructions (Signed)
Continue coumadin 1 tablet daily except 1/2 tablet on Mondays, Wednesdays and Fridays Has been diagnosed with Alpha Gal.   Keep greens consistant Recheck in 4 weeks

## 2019-02-18 NOTE — Telephone Encounter (Signed)
Patient informed and agrees to stopping losartan and starting entresto. Also aware to stop imdur and get lab work in 2 weeks to check BMET. Patient will come to office next week to pick up voucher for entresto.

## 2019-02-18 NOTE — Telephone Encounter (Signed)
-----   Message from [name], MD sent at 02/18/2019  1:22 PM EST ----- Results reviewed.  LVEF is relatively stable at 20 to 25% range consistent with ischemic cardiomyopathy.  We discussed possibly switching from Cozaar to [facility] at recent clinic visit, although her blood pressure might limit this somewhat.  If she is amenable, suggest stopping Cozaar and start Entresto at lowest dose, also go ahead and stop Imdur for the time being.  If she tolerates this, recheck BMET in 2 weeks. A copy of this test should be forwarded to Kari Baars, MD.

## 2019-02-19 ENCOUNTER — Encounter: Payer: Self-pay | Admitting: *Deleted

## 2019-02-19 NOTE — Progress Notes (Signed)
Received notification from [facility] that entresto 24/26/ mg has been approved through 12/30/2019. Notification faxed to [facility].

## 2019-03-03 ENCOUNTER — Ambulatory Visit (INDEPENDENT_AMBULATORY_CARE_PROVIDER_SITE_OTHER): Payer: Medicare PPO | Admitting: *Deleted

## 2019-03-03 DIAGNOSIS — I255 Ischemic cardiomyopathy: Secondary | ICD-10-CM

## 2019-03-03 DIAGNOSIS — I5022 Chronic systolic (congestive) heart failure: Secondary | ICD-10-CM

## 2019-03-04 ENCOUNTER — Telehealth: Payer: Self-pay

## 2019-03-04 NOTE — Telephone Encounter (Signed)
Spoke with patient to remind of missed remote transmission 

## 2019-03-06 LAB — CUP PACEART REMOTE DEVICE CHECK
Battery Remaining Longevity: 126 mo
Battery Remaining Percentage: 100 %
Brady Statistic RA Percent Paced: 0 %
Brady Statistic RV Percent Paced: 0 %
Date Time Interrogation Session: 20200305145100
HighPow Impedance: 51 Ohm
Implantable Lead Implant Date: 20081119
Implantable Lead Implant Date: 20081119
Implantable Lead Location: 753859
Implantable Lead Location: 753860
Implantable Lead Model: 185
Implantable Lead Model: 4136
Implantable Lead Serial Number: 211124
Implantable Lead Serial Number: 28383796
Implantable Pulse Generator Implant Date: 20170303
Lead Channel Impedance Value: 448 Ohm
Lead Channel Impedance Value: 521 Ohm
Lead Channel Pacing Threshold Amplitude: 0.5 V
Lead Channel Pacing Threshold Amplitude: 1.1 V
Lead Channel Pacing Threshold Pulse Width: 0.5 ms
Lead Channel Pacing Threshold Pulse Width: 0.5 ms
Lead Channel Setting Pacing Amplitude: 2 V
Lead Channel Setting Pacing Amplitude: 2.4 V
Lead Channel Setting Pacing Pulse Width: 0.5 ms
Lead Channel Setting Sensing Sensitivity: 0.6 mV
Pulse Gen Serial Number: 204398

## 2019-03-09 ENCOUNTER — Ambulatory Visit: Payer: Medicare PPO | Admitting: Orthopaedic Surgery

## 2019-03-11 NOTE — Progress Notes (Signed)
Remote ICD transmission.   

## 2019-03-12 ENCOUNTER — Telehealth: Payer: Self-pay | Admitting: Cardiology

## 2019-03-12 NOTE — Telephone Encounter (Signed)
Patient informed that lab order has been faxed to [facility] on [address] In [place].

## 2019-03-12 NOTE — Telephone Encounter (Signed)
Wants to have labs done at [facility] on [address] in [place]

## 2019-03-16 ENCOUNTER — Ambulatory Visit (INDEPENDENT_AMBULATORY_CARE_PROVIDER_SITE_OTHER): Payer: Medicare PPO

## 2019-03-16 ENCOUNTER — Encounter: Payer: Self-pay | Admitting: Orthopaedic Surgery

## 2019-03-16 ENCOUNTER — Telehealth: Payer: Self-pay | Admitting: *Deleted

## 2019-03-16 ENCOUNTER — Ambulatory Visit: Payer: Medicare PPO | Admitting: Orthopaedic Surgery

## 2019-03-16 ENCOUNTER — Other Ambulatory Visit: Payer: Self-pay

## 2019-03-16 VITALS — BP 122/59 | HR 83 | Ht 67.0 in | Wt 215.0 lb

## 2019-03-16 DIAGNOSIS — M25561 Pain in right knee: Secondary | ICD-10-CM | POA: Diagnosis not present

## 2019-03-16 DIAGNOSIS — G8929 Other chronic pain: Secondary | ICD-10-CM

## 2019-03-16 DIAGNOSIS — I5042 Chronic combined systolic (congestive) and diastolic (congestive) heart failure: Secondary | ICD-10-CM | POA: Diagnosis not present

## 2019-03-16 DIAGNOSIS — F17210 Nicotine dependence, cigarettes, uncomplicated: Secondary | ICD-10-CM

## 2019-03-16 NOTE — Telephone Encounter (Signed)
Reviewed with Dr. Lenon Oms recommended changes at this time.

## 2019-03-16 NOTE — Telephone Encounter (Signed)
    Covering for Dr. Diona Browner in his absence. By review of the chart, she should be on Coreg 12.5mg  BID. Thank you for clarifying this with the patient.   Signed, Ellsworth Lennox, PA-C 03/16/2019, 4:06 PM Pager: [phone]

## 2019-03-16 NOTE — Telephone Encounter (Signed)
Received Latitude alert for VF episode on 03/14/19 at 02:22, converted to NSR with 41J shock x1.   Spoke with patient. She denies symptoms with episode, was asleep at the time. Reports compliance with medications other than carvedilol, which she thought Dr. Diona Browner had decreased to 12.5mg  once daily (taking in the AM) when she started [facility] a few weeks ago. She therefore may have missed some evening doses of carvedilol, though she is unsure how many. Reviewed notes, advised that Dr. Diona Browner stopped her losartan and IMDUR, but I don't see note regarding changes to carvedilol. Advised patient to resume carvedilol 12.5mg  BID and to monitor for low BPs. Also advised of [place] DMV driving restrictions x6 months. Pt verbalizes understanding.  Advised I will route this message to Dr. Diona Browner to confirm carvedilol dose and will route to Dr. Ladona Ridgel for review of episode. Pt is in agreement with plan and denies questions or concerns at this time.

## 2019-03-16 NOTE — Progress Notes (Signed)
Patient [name] [name], female DOB:1961/07/29, 58 y.o. EVO:[id number]  Chief Complaint  Patient presents with  . Knee Pain    right     HPI  [name] [name] is a 58 y.o. female who has increasing pain of the right knee over the last few months. She has a long history of recurrent right knee pain.  She has no giving way, no locking. She has swelling and popping.  She has tried ice, rest, heat, Tylenol with no help. She has a pacemaker and cannot have MRI.  She has seen Dr. Despina Hick in the past for the knee.   Body mass index is 33.67 kg/m.  ROS  Review of Systems  Constitutional: Positive for activity change.  Musculoskeletal: Positive for arthralgias and back pain.  Neurological: Positive for headaches.  All other systems reviewed and are negative.   All other systems reviewed and are negative.  The following is a summary of the past history medically, past history surgically, known current medicines, social history and family history.  This information is gathered electronically by the computer from prior information and documentation.  I review this each visit and have found including this information at this point in the chart is beneficial and informative.    Past Medical History:  Diagnosis Date  . Aneurysm of internal carotid artery   . Anticardiolipin antibody positive    Chronic Coumadin  . Chronic combined systolic (congestive) and diastolic (congestive) heart failure (HCC)   . Cocaine abuse in remission (HCC)   . Coronary atherosclerosis of native coronary artery    Previous invasive cardiac testing was done at a facility in [place], [place].  Occluded diagonal, Diffuse LAD disease, Occluded Cx, and non obstructive RCA.   . Essential hypertension, benign   . Headache   . History of pneumonia   . Ischemic cardiomyopathy    LVEF 30-35%  . Mixed hyperlipidemia   . PVD (peripheral vascular disease) (HCC)   . Raynaud's phenomenon   . Stroke [facility]) 2007   Total  occlusion of the right internal carotid    Past Surgical History:  Procedure Laterality Date  . ABDOMINAL AORTAGRAM N/A 05/25/2014   Procedure: ABDOMINAL Ronny Flurry;  Surgeon: Iran Ouch, MD;  Location: [facility] CATH LAB;  Service: Cardiovascular;  Laterality: N/A;  . APPENDECTOMY    . CARDIAC DEFIBRILLATOR PLACEMENT     St.Jude ICD  . EP IMPLANTABLE DEVICE N/A 03/01/2016   Procedure: ICD Generator Changeout;  Surgeon: [name], MD;  Location: [facility] INVASIVE CV LAB;  Service: Cardiovascular;  Laterality: N/A;  . L1 corpectomy   12/10  . Laryngeal polyp excision      Family History  Problem Relation Age of Onset  . Heart disease Father 66  . Ankylosing spondylitis Sister   . Stomach cancer Brother 62  . Heart attack Brother     Social History Social History   Tobacco Use  . Smoking status: Current Every Day Smoker    Packs/day: 1.00    Years: 43.00    Pack years: 43.00    Types: E-cigarettes    Start date: 03/03/1978    Last attempt to quit: 10/03/2013    Years since quitting: 5.4  . Smokeless tobacco: Never Used  . Tobacco comment: resumed cigarettes, stopped last week but continues to vape  Substance Use Topics  . Alcohol use: No    Alcohol/week: 0.0 standard drinks  . Drug use: No    Comment: Prior history of cocaine  Allergies  Allergen Reactions  . Metoclopramide Hcl Anaphylaxis    Non responsive  . Penicillins Anaphylaxis    Pt states she can take any cephalosporin (per pt. 01/01/19) Shock Has patient had a PCN reaction causing immediate rash, facial/tongue/throat swelling, SOB or lightheadedness with hypotension: Yes Has patient had a PCN reaction causing severe rash involving mucus membranes or skin necrosis: No Has patient had a PCN reaction that required hospitalization Yes Has patient had a PCN reaction occurring within the last 10 years: No If all of the above answers are "NO", then may proceed with Cephalosporin   . Metoprolol Other (See Comments)     Syncope   . Statins Other (See Comments)    Myalgias    Current Outpatient Medications  Medication Sig Dispense Refill  . carvedilol (COREG) 12.5 MG tablet TAKE 1 TABLET BY MOUTH TWICE DAILY WITH MEALS 180 tablet 3  . clonazePAM (KLONOPIN) 1 MG tablet Take 1 mg by mouth 4 (four) times daily as needed for anxiety.    . DULoxetine (CYMBALTA) 60 MG capsule Take 60 mg by mouth every morning.    . famotidine (PEPCID) 20 MG tablet Take 1 tablet (20 mg total) by mouth 2 (two) times daily. 60 tablet 1  . furosemide (LASIX) 40 MG tablet TAKE 1 TABLET BY MOUTH DAILY AS NEEDED FOR SWELLING. 30 tablet 6  . furosemide (LASIX) 80 MG tablet TAKE 1 TABLET BY MOUTH DAILY 90 tablet 1  . gabapentin (NEURONTIN) 300 MG capsule Take 300 mg by mouth 2 (two) times daily.    Marland Kitchen guaiFENesin (MUCINEX) 600 MG 12 hr tablet Take 2 tablets (1,200 mg total) by mouth 2 (two) times daily. 40 tablet 1  . HYDROcodone-acetaminophen (NORCO) 10-325 MG per tablet Take 0.5 tablets by mouth 4 (four) times daily as needed for moderate pain or severe pain. pain    . Multiple Vitamins-Minerals (MULTIVITAMINS THER. W/MINERALS) TABS Take 1 tablet by mouth daily.      . nitroGLYCERIN (NITROSTAT) 0.4 MG SL tablet PLACE 1 TABLET UNDER THE TONGUE EVERY 5 MINUTES FOR 3 DOSES AS NEEDED CHEST PAIN 25 tablet 3  . potassium chloride SA (K-DUR,KLOR-CON) 20 MEQ tablet TAKE 1 TABLET BY MOUTH EVERY DAY 90 tablet 3  . promethazine (PHENERGAN) 25 MG tablet Take 25 mg by mouth every 6 (six) hours as needed for nausea.    . sacubitril-valsartan (ENTRESTO) 24-26 MG Take 1 tablet by mouth 2 (two) times daily. 60 tablet 6  . spironolactone (ALDACTONE) 25 MG tablet TAKE 1 TABLET BY MOUTH EVERY DAY 90 tablet 1  . warfarin (COUMADIN) 10 MG tablet Take 1 tablet daily except 1/2 tablet on Thursdays (Patient taking differently: Take 5-10 mg by mouth See admin instructions. TAKES 5MG  ON TUESDAYS AND THURSDAYS AND TAKES 10MG  ON ALL OTHER DAYS) 30 tablet 3   No  current facility-administered medications for this visit.      Physical Exam  Blood pressure (!) 122/59, pulse 83, height 5\' 7"  (1.702 m), weight 215 lb (97.5 kg).  Constitutional: overall normal hygiene, normal nutrition, well developed, normal grooming, normal body habitus. Assistive device:none  Musculoskeletal: gait and station Limp right, muscle tone and strength are normal, no tremors or atrophy is present.  .  Neurological: coordination overall normal.  Deep tendon reflex/nerve stretch intact.  Sensation normal.  Cranial nerves II-XII intact.   Skin:   Normal overall no scars, lesions, ulcers or rashes. No psoriasis.  Psychiatric: Alert and oriented x 3.  Recent memory intact,  remote memory unclear.  Normal mood and affect. Well groomed.  Good eye contact.  Cardiovascular: overall no swelling, no varicosities, no edema bilaterally, normal temperatures of the legs and arms, no clubbing, cyanosis and good capillary refill.  Lymphatic: palpation is normal.  Right knee pain, slight limp to the right, ROM 0 to 110, medial joint line pain, crepitus.  Knee is stable. No distal edema.  All other systems reviewed and are negative   The patient has been educated about the nature of the problem(s) and counseled on treatment options.  The patient appeared to understand what I have discussed and is in agreement with it.  Encounter Diagnoses  Name Primary?  . Chronic pain of right knee Yes  . Cigarette nicotine dependence without complication    PROCEDURE NOTE:  The patient requests injections of the right knee , verbal consent was obtained.  The right knee was prepped appropriately after time out was performed.   Sterile technique was observed and injection of 1 cc of Depo-Medrol 40 mg with several cc's of plain xylocaine. Anesthesia was provided by ethyl chloride and a 20-gauge needle was used to inject the knee area. The injection was tolerated well.  A band aid dressing was  applied.  The patient was advised to apply ice later today and tomorrow to the injection sight as needed.   PLAN Call if any problems.  Precautions discussed.  Continue current medications.   Return to clinic 2 weeks   Electronically Signed Darreld Mclean, MD 3/17/20209:34 AM

## 2019-03-16 NOTE — Telephone Encounter (Signed)
Patient called back and had several questions regarding her home monitor. She stated that a friend was at her home over the weekend and noticed the home monitor going crazy. She wanted to know if that could be related to the shock that was reported. I informed her that it would not be related. She wanted to know about the distance of the monitor b/c it is currently 60 feet from where she sleeps b/c it is hooked up with her Internet. I explained to her that the monitor is designed to wake up during a certain time and if it doesn't find her device it'll continue to wake up every so often until it finds her device that as long as she spends a significant amount of time in the room where the monitor is at it is ok. Pt is just confused about why she got shocked and she didn't feel it or wake up from it.

## 2019-03-16 NOTE — Patient Instructions (Signed)
Steps to Quit Smoking    Smoking tobacco can be bad for your health. It can also affect almost every organ in your body. Smoking puts you and people around you at risk for many serious long-lasting (chronic) diseases. Quitting smoking is hard, but it is one of the best things that you can do for your health. It is never too late to quit.  What are the benefits of quitting smoking?  When you quit smoking, you lower your risk for getting serious diseases and conditions. They can include:  · Lung cancer or lung disease.  · Heart disease.  · Stroke.  · Heart attack.  · Not being able to have children (infertility).  · Weak bones (osteoporosis) and broken bones (fractures).  If you have coughing, wheezing, and shortness of breath, those symptoms may get better when you quit. You may also get sick less often. If you are pregnant, quitting smoking can help to lower your chances of having a baby of low birth weight.  What can I do to help me quit smoking?  Talk with your doctor about what can help you quit smoking. Some things you can do (strategies) include:  · Quitting smoking totally, instead of slowly cutting back how much you smoke over a period of time.  · Going to in-person counseling. You are more likely to quit if you go to many counseling sessions.  · Using resources and support systems, such as:  ? Online chats with a counselor.  ? Phone quitlines.  ? Printed self-help materials.  ? Support groups or group counseling.  ? Text messaging programs.  ? Mobile phone apps or applications.  · Taking medicines. Some of these medicines may have nicotine in them. If you are pregnant or breastfeeding, do not take any medicines to quit smoking unless your doctor says it is okay. Talk with your doctor about counseling or other things that can help you.  Talk with your doctor about using more than one strategy at the same time, such as taking medicines while you are also going to in-person counseling. This can help make  quitting easier.  What things can I do to make it easier to quit?  Quitting smoking might feel very hard at first, but there is a lot that you can do to make it easier. Take these steps:  · Talk to your family and friends. Ask them to support and encourage you.  · Call phone quitlines, reach out to support groups, or work with a counselor.  · Ask people who smoke to not smoke around you.  · Avoid places that make you want (trigger) to smoke, such as:  ? Bars.  ? Parties.  ? Smoke-break areas at work.  · Spend time with people who do not smoke.  · Lower the stress in your life. Stress can make you want to smoke. Try these things to help your stress:  ? Getting regular exercise.  ? Deep-breathing exercises.  ? Yoga.  ? Meditating.  ? Doing a body scan. To do this, close your eyes, focus on one area of your body at a time from head to toe, and notice which parts of your body are tense. Try to relax the muscles in those areas.  · Download or buy apps on your mobile phone or tablet that can help you stick to your quit plan. There are many free apps, such as QuitGuide from the CDC (Centers for Disease Control and Prevention). You can find more   support from smokefree.gov and other websites.  This information is not intended to replace advice given to you by your health care provider. Make sure you discuss any questions you have with your health care provider.  Document Released: 10/12/2009 Document Revised: 08/13/2016 Document Reviewed: 05/02/2015  Elsevier Interactive Patient Education © 2019 Elsevier Inc.

## 2019-03-16 NOTE — Telephone Encounter (Signed)
Spoke with patient to update her. She verbalizes understanding of instructions to continue carvedilol 12.5mg  BID. She reports she has already updated her pill case. Pt denies additional questions or concerns at this time and thanked me for my call.

## 2019-03-17 ENCOUNTER — Telehealth: Payer: Self-pay | Admitting: *Deleted

## 2019-03-17 DIAGNOSIS — Z1211 Encounter for screening for malignant neoplasm of colon: Secondary | ICD-10-CM | POA: Diagnosis not present

## 2019-03-17 DIAGNOSIS — Z1212 Encounter for screening for malignant neoplasm of rectum: Secondary | ICD-10-CM | POA: Diagnosis not present

## 2019-03-17 NOTE — Telephone Encounter (Signed)
Patient informed. Copy sent to PCP °

## 2019-03-17 NOTE — Telephone Encounter (Signed)
-----   Message from Ellsworth Lennox, [place] sent at 03/17/2019 12:03 PM EDT ----- Covering for Dr. Diona Browner - Please let the patient know her electrolytes and kidney function remain within normal limits. Continue current medication regimen. Please forward a copy of labs to Kari Baars, MD.

## 2019-03-18 ENCOUNTER — Other Ambulatory Visit: Payer: Self-pay

## 2019-03-18 ENCOUNTER — Ambulatory Visit (INDEPENDENT_AMBULATORY_CARE_PROVIDER_SITE_OTHER): Payer: Medicare PPO | Admitting: *Deleted

## 2019-03-18 DIAGNOSIS — D6861 Antiphospholipid syndrome: Secondary | ICD-10-CM

## 2019-03-18 DIAGNOSIS — I63549 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: Secondary | ICD-10-CM

## 2019-03-18 DIAGNOSIS — D6859 Other primary thrombophilia: Secondary | ICD-10-CM

## 2019-03-18 DIAGNOSIS — Z5181 Encounter for therapeutic drug level monitoring: Secondary | ICD-10-CM

## 2019-03-18 LAB — POCT INR: INR: 2.8 (ref 2.0–3.0)

## 2019-03-18 NOTE — Patient Instructions (Signed)
Continue coumadin 1 tablet daily except 1/2 tablet on Mondays, Wednesdays and Fridays Has been diagnosed with Alpha Gal.   Keep greens consistant Recheck in 6 weeks

## 2019-03-30 ENCOUNTER — Other Ambulatory Visit: Payer: Self-pay

## 2019-03-30 ENCOUNTER — Encounter: Payer: Self-pay | Admitting: Orthopaedic Surgery

## 2019-03-30 ENCOUNTER — Ambulatory Visit (INDEPENDENT_AMBULATORY_CARE_PROVIDER_SITE_OTHER): Payer: Medicare PPO | Admitting: Orthopaedic Surgery

## 2019-03-30 VITALS — BP 133/77 | HR 75 | Ht 67.0 in | Wt 215.0 lb

## 2019-03-30 DIAGNOSIS — M25561 Pain in right knee: Secondary | ICD-10-CM | POA: Diagnosis not present

## 2019-03-30 DIAGNOSIS — S52135D Nondisplaced fracture of neck of left radius, subsequent encounter for closed fracture with routine healing: Secondary | ICD-10-CM

## 2019-03-30 DIAGNOSIS — G8929 Other chronic pain: Secondary | ICD-10-CM | POA: Diagnosis not present

## 2019-03-30 DIAGNOSIS — F17210 Nicotine dependence, cigarettes, uncomplicated: Secondary | ICD-10-CM | POA: Diagnosis not present

## 2019-03-30 NOTE — Progress Notes (Signed)
Patient [name] [name], female DOB:26-Mar-1961, 58 y.o. AVW:[id number]  Chief Complaint  Patient presents with  . Elbow Pain    left/ no pain   . Knee Pain    right     HPI  [name] [name] is a 58 y.o. female who has right knee pain and left elbow fracture.  Her left elbow is much improved and not hurting.  Her right knee has a trigger spot at the pes anserinus bursa area.  She has no swelling, no locking.   Body mass index is 33.67 kg/m.  ROS  Review of Systems  Constitutional: Positive for activity change.  Musculoskeletal: Positive for arthralgias and back pain.  Neurological: Positive for headaches.  All other systems reviewed and are negative.   All other systems reviewed and are negative.  The following is a summary of the past history medically, past history surgically, known current medicines, social history and family history.  This information is gathered electronically by the computer from prior information and documentation.  I review this each visit and have found including this information at this point in the chart is beneficial and informative.    Past Medical History:  Diagnosis Date  . Aneurysm of internal carotid artery   . Anticardiolipin antibody positive    Chronic Coumadin  . Chronic combined systolic (congestive) and diastolic (congestive) heart failure (HCC)   . Cocaine abuse in remission (HCC)   . Coronary atherosclerosis of native coronary artery    Previous invasive cardiac testing was done at a facility in [place], [place].  Occluded diagonal, Diffuse LAD disease, Occluded Cx, and non obstructive RCA.   . Essential hypertension, benign   . Headache   . History of pneumonia   . Ischemic cardiomyopathy    LVEF 30-35%  . Mixed hyperlipidemia   . PVD (peripheral vascular disease) (HCC)   . Raynaud's phenomenon   . Stroke [facility]) 2007   Total occlusion of the right internal carotid    Past Surgical History:  Procedure Laterality Date  .  ABDOMINAL AORTAGRAM N/A 05/25/2014   Procedure: ABDOMINAL Ronny Flurry;  Surgeon: Iran Ouch, MD;  Location: [facility] CATH LAB;  Service: Cardiovascular;  Laterality: N/A;  . APPENDECTOMY    . CARDIAC DEFIBRILLATOR PLACEMENT     [employer]Jude ICD  . EP IMPLANTABLE DEVICE N/A 03/01/2016   Procedure: ICD Generator Changeout;  Surgeon: [name], MD;  Location: [facility] INVASIVE CV LAB;  Service: Cardiovascular;  Laterality: N/A;  . L1 corpectomy   12/10  . Laryngeal polyp excision      Family History  Problem Relation Age of Onset  . Heart disease Father 42  . Ankylosing spondylitis Sister   . Stomach cancer Brother 59  . Heart attack Brother     Social History Social History   Tobacco Use  . Smoking status: Current Every Day Smoker    Packs/day: 1.00    Years: 43.00    Pack years: 43.00    Types: E-cigarettes    Start date: 03/03/1978    Last attempt to quit: 10/03/2013    Years since quitting: 5.4  . Smokeless tobacco: Never Used  . Tobacco comment: resumed cigarettes, stopped last week but continues to vape  Substance Use Topics  . Alcohol use: No    Alcohol/week: 0.0 standard drinks  . Drug use: No    Comment: Prior history of cocaine    Allergies  Allergen Reactions  . Metoclopramide Hcl Anaphylaxis    Non responsive  .  Penicillins Anaphylaxis    Pt states she can take any cephalosporin (per pt. 01/01/19) Shock Has patient had a PCN reaction causing immediate rash, facial/tongue/throat swelling, SOB or lightheadedness with hypotension: Yes Has patient had a PCN reaction causing severe rash involving mucus membranes or skin necrosis: No Has patient had a PCN reaction that required hospitalization Yes Has patient had a PCN reaction occurring within the last 10 years: No If all of the above answers are "NO", then may proceed with Cephalosporin   . Metoprolol Other (See Comments)    Syncope   . Statins Other (See Comments)    Myalgias    Current Outpatient Medications   Medication Sig Dispense Refill  . carvedilol (COREG) 12.5 MG tablet TAKE 1 TABLET BY MOUTH TWICE DAILY WITH MEALS (Patient taking differently: Take 12.5 mg by mouth daily. ) 180 tablet 3  . clonazePAM (KLONOPIN) 1 MG tablet Take 1 mg by mouth 4 (four) times daily as needed for anxiety.    . DULoxetine (CYMBALTA) 60 MG capsule Take 60 mg by mouth every morning.    . famotidine (PEPCID) 20 MG tablet Take 1 tablet (20 mg total) by mouth 2 (two) times daily. 60 tablet 1  . furosemide (LASIX) 40 MG tablet TAKE 1 TABLET BY MOUTH DAILY AS NEEDED FOR SWELLING. 30 tablet 6  . furosemide (LASIX) 80 MG tablet TAKE 1 TABLET BY MOUTH DAILY 90 tablet 1  . gabapentin (NEURONTIN) 300 MG capsule Take 300 mg by mouth 2 (two) times daily.    Marland Kitchen guaiFENesin (MUCINEX) 600 MG 12 hr tablet Take 2 tablets (1,200 mg total) by mouth 2 (two) times daily. 40 tablet 1  . HYDROcodone-acetaminophen (NORCO) 10-325 MG per tablet Take 0.5 tablets by mouth 4 (four) times daily as needed for moderate pain or severe pain. pain    . Multiple Vitamins-Minerals (MULTIVITAMINS THER. W/MINERALS) TABS Take 1 tablet by mouth daily.      . nitroGLYCERIN (NITROSTAT) 0.4 MG SL tablet PLACE 1 TABLET UNDER THE TONGUE EVERY 5 MINUTES FOR 3 DOSES AS NEEDED CHEST PAIN 25 tablet 3  . potassium chloride SA (K-DUR,KLOR-CON) 20 MEQ tablet TAKE 1 TABLET BY MOUTH EVERY DAY 90 tablet 3  . promethazine (PHENERGAN) 25 MG tablet Take 25 mg by mouth every 6 (six) hours as needed for nausea.    . sacubitril-valsartan (ENTRESTO) 24-26 MG Take 1 tablet by mouth 2 (two) times daily. 60 tablet 6  . spironolactone (ALDACTONE) 25 MG tablet TAKE 1 TABLET BY MOUTH EVERY DAY 90 tablet 1  . warfarin (COUMADIN) 10 MG tablet Take 1 tablet daily except 1/2 tablet on Thursdays (Patient taking differently: Take 5-10 mg by mouth See admin instructions. TAKES 5MG  ON TUESDAYS AND THURSDAYS AND TAKES 10MG  ON ALL OTHER DAYS) 30 tablet 3   No current facility-administered  medications for this visit.      Physical Exam  Blood pressure 133/77, pulse 75, height 5\' 7"  (1.702 m), weight 215 lb (97.5 kg).  Constitutional: overall normal hygiene, normal nutrition, well developed, normal grooming, normal body habitus. Assistive device:none  Musculoskeletal: gait and station Limp right, muscle tone and strength are normal, no tremors or atrophy is present.  .  Neurological: coordination overall normal.  Deep tendon reflex/nerve stretch intact.  Sensation normal.  Cranial nerves II-XII intact.   Skin:   Normal overall no scars, lesions, ulcers or rashes. No psoriasis.  Psychiatric: Alert and oriented x 3.  Recent memory intact, remote memory unclear.  Normal mood and  affect. Well groomed.  Good eye contact.  Cardiovascular: overall no swelling, no varicosities, no edema bilaterally, normal temperatures of the legs and arms, no clubbing, cyanosis and good capillary refill.  Lymphatic: palpation is normal.  Left elbow has full motion.  Right knee has ROM 0 to 115, no effusion, pain over the pes anserinus bursa area, no redness.  NV intact.  All other systems reviewed and are negative   The patient has been educated about the nature of the problem(s) and counseled on treatment options.  The patient appeared to understand what I have discussed and is in agreement with it.  Encounter Diagnoses  Name Primary?  . Chronic pain of right knee Yes  . Cigarette nicotine dependence without complication   . Closed nondisplaced fracture of neck of left radius with routine healing, subsequent encounter    PROCEDURE NOTE:  The patient requests injections of the right knee , verbal consent was obtained.  The right knee was prepped appropriately after time out was performed.   Sterile technique was observed and injection of 1 cc of Depo-Medrol 40 mg with several cc's of plain xylocaine. Anesthesia was provided by ethyl chloride and a 20-gauge needle was used to inject  the knee area. The injection was tolerated well.  A band aid dressing was applied.  The patient was advised to apply ice later today and tomorrow to the injection sight as needed.   PLAN Call if any problems.  Precautions discussed.  Continue current medications.   Return to clinic 1 month   Electronically Signed Darreld Mclean, MD 3/31/202010:08 AM

## 2019-03-30 NOTE — Patient Instructions (Signed)
Steps to Quit Smoking    Smoking tobacco can be bad for your health. It can also affect almost every organ in your body. Smoking puts you and people around you at risk for many serious long-lasting (chronic) diseases. Quitting smoking is hard, but it is one of the best things that you can do for your health. It is never too late to quit.  What are the benefits of quitting smoking?  When you quit smoking, you lower your risk for getting serious diseases and conditions. They can include:  · Lung cancer or lung disease.  · Heart disease.  · Stroke.  · Heart attack.  · Not being able to have children (infertility).  · Weak bones (osteoporosis) and broken bones (fractures).  If you have coughing, wheezing, and shortness of breath, those symptoms may get better when you quit. You may also get sick less often. If you are pregnant, quitting smoking can help to lower your chances of having a baby of low birth weight.  What can I do to help me quit smoking?  Talk with your doctor about what can help you quit smoking. Some things you can do (strategies) include:  · Quitting smoking totally, instead of slowly cutting back how much you smoke over a period of time.  · Going to in-person counseling. You are more likely to quit if you go to many counseling sessions.  · Using resources and support systems, such as:  ? Online chats with a counselor.  ? Phone quitlines.  ? Printed self-help materials.  ? Support groups or group counseling.  ? Text messaging programs.  ? Mobile phone apps or applications.  · Taking medicines. Some of these medicines may have nicotine in them. If you are pregnant or breastfeeding, do not take any medicines to quit smoking unless your doctor says it is okay. Talk with your doctor about counseling or other things that can help you.  Talk with your doctor about using more than one strategy at the same time, such as taking medicines while you are also going to in-person counseling. This can help make  quitting easier.  What things can I do to make it easier to quit?  Quitting smoking might feel very hard at first, but there is a lot that you can do to make it easier. Take these steps:  · Talk to your family and friends. Ask them to support and encourage you.  · Call phone quitlines, reach out to support groups, or work with a counselor.  · Ask people who smoke to not smoke around you.  · Avoid places that make you want (trigger) to smoke, such as:  ? Bars.  ? Parties.  ? Smoke-break areas at work.  · Spend time with people who do not smoke.  · Lower the stress in your life. Stress can make you want to smoke. Try these things to help your stress:  ? Getting regular exercise.  ? Deep-breathing exercises.  ? Yoga.  ? Meditating.  ? Doing a body scan. To do this, close your eyes, focus on one area of your body at a time from head to toe, and notice which parts of your body are tense. Try to relax the muscles in those areas.  · Download or buy apps on your mobile phone or tablet that can help you stick to your quit plan. There are many free apps, such as QuitGuide from the CDC (Centers for Disease Control and Prevention). You can find more   support from smokefree.gov and other websites.  This information is not intended to replace advice given to you by your health care provider. Make sure you discuss any questions you have with your health care provider.  Document Released: 10/12/2009 Document Revised: 08/13/2016 Document Reviewed: 05/02/2015  Elsevier Interactive Patient Education © 2019 Elsevier Inc.

## 2019-04-02 DIAGNOSIS — I2510 Atherosclerotic heart disease of native coronary artery without angina pectoris: Secondary | ICD-10-CM | POA: Diagnosis not present

## 2019-04-02 DIAGNOSIS — F419 Anxiety disorder, unspecified: Secondary | ICD-10-CM | POA: Diagnosis not present

## 2019-04-02 DIAGNOSIS — I10 Essential (primary) hypertension: Secondary | ICD-10-CM | POA: Diagnosis not present

## 2019-04-02 DIAGNOSIS — M170 Bilateral primary osteoarthritis of knee: Secondary | ICD-10-CM | POA: Diagnosis not present

## 2019-04-26 ENCOUNTER — Other Ambulatory Visit (HOSPITAL_COMMUNITY): Payer: Self-pay

## 2019-04-26 DIAGNOSIS — D696 Thrombocytopenia, unspecified: Secondary | ICD-10-CM

## 2019-04-27 ENCOUNTER — Ambulatory Visit (INDEPENDENT_AMBULATORY_CARE_PROVIDER_SITE_OTHER): Payer: Medicare PPO | Admitting: Orthopaedic Surgery

## 2019-04-27 ENCOUNTER — Other Ambulatory Visit: Payer: Self-pay

## 2019-04-27 ENCOUNTER — Encounter: Payer: Self-pay | Admitting: Orthopaedic Surgery

## 2019-04-27 ENCOUNTER — Other Ambulatory Visit (HOSPITAL_COMMUNITY): Payer: Medicare PPO

## 2019-04-27 DIAGNOSIS — M25561 Pain in right knee: Secondary | ICD-10-CM

## 2019-04-27 DIAGNOSIS — G8929 Other chronic pain: Secondary | ICD-10-CM | POA: Diagnosis not present

## 2019-04-27 NOTE — Progress Notes (Signed)
Virtual Visit via Telephone Note  I connected with [name] [name] on 04/27/19 at  9:50 AM EDT by telephone and verified that I am speaking with the correct person using two identifiers.   I discussed the limitations, risks, security and privacy concerns of performing an evaluation and management service by telephone and the availability of in person appointments. I also discussed with the patient that there may be a patient responsible charge related to this service. The patient expressed understanding and agreed to proceed.   History of Present Illness: Her right knee is much better after after the injection last time.  She is walking more and not having any swelling, no redness, no giving way.  She is pleased with her progress.   Observations/Objective: Per above  Assessment and Plan: Encounter Diagnosis  Name Primary?  . Chronic pain of right knee Yes     Follow Up Instructions: As needed.   I discussed the assessment and treatment plan with the patient. The patient was provided an opportunity to ask questions and all were answered. The patient agreed with the plan and demonstrated an understanding of the instructions.   The patient was advised to call back or seek an in-person evaluation if the symptoms worsen or if the condition fails to improve as anticipated.  I provided 8 minutes of non-face-to-face time during this encounter.   Darreld Mclean, MD

## 2019-04-28 ENCOUNTER — Telehealth: Payer: Self-pay | Admitting: Orthopaedic Surgery

## 2019-04-28 NOTE — Telephone Encounter (Signed)
Patient left message on voicemail wanting to speak to someone about her sling. I don't see where he discussed a sling yesterday.  Please call and advise.  [phone]

## 2019-04-28 NOTE — Telephone Encounter (Signed)
Attempted to call patient- no answer/ no VM 

## 2019-05-04 ENCOUNTER — Other Ambulatory Visit: Payer: Self-pay | Admitting: Cardiology

## 2019-05-05 ENCOUNTER — Telehealth: Payer: Self-pay | Admitting: *Deleted

## 2019-05-05 NOTE — Telephone Encounter (Signed)

## 2019-05-06 ENCOUNTER — Ambulatory Visit (INDEPENDENT_AMBULATORY_CARE_PROVIDER_SITE_OTHER): Payer: Medicare PPO | Admitting: *Deleted

## 2019-05-06 ENCOUNTER — Other Ambulatory Visit: Payer: Self-pay

## 2019-05-06 DIAGNOSIS — D6859 Other primary thrombophilia: Secondary | ICD-10-CM | POA: Diagnosis not present

## 2019-05-06 DIAGNOSIS — Z5181 Encounter for therapeutic drug level monitoring: Secondary | ICD-10-CM

## 2019-05-06 DIAGNOSIS — I63549 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: Secondary | ICD-10-CM | POA: Diagnosis not present

## 2019-05-06 LAB — POCT INR: INR: 1.7 — AB (ref 2.0–3.0)

## 2019-05-06 NOTE — Patient Instructions (Signed)
Take coumadin 1 1/2 tablets tonight, 1 tablet tomorrow night then resume 1 tablet daily except 1/2 tablet on Mondays, Wednesdays and Fridays Has been diagnosed with Alpha Gal.   Keep greens consistant Recheck in 6 weeks

## 2019-06-02 ENCOUNTER — Ambulatory Visit (INDEPENDENT_AMBULATORY_CARE_PROVIDER_SITE_OTHER): Payer: Medicare PPO | Admitting: *Deleted

## 2019-06-02 DIAGNOSIS — I255 Ischemic cardiomyopathy: Secondary | ICD-10-CM | POA: Diagnosis not present

## 2019-06-02 LAB — CUP PACEART REMOTE DEVICE CHECK
Battery Remaining Longevity: 126 mo
Battery Remaining Percentage: 100 %
Brady Statistic RA Percent Paced: 0 %
Brady Statistic RV Percent Paced: 0 %
Date Time Interrogation Session: 20200603112300
HighPow Impedance: 49 Ohm
Implantable Lead Implant Date: 20081119
Implantable Lead Implant Date: 20081119
Implantable Lead Location: 753859
Implantable Lead Location: 753860
Implantable Lead Model: 185
Implantable Lead Model: 4136
Implantable Lead Serial Number: 211124
Implantable Lead Serial Number: 28383796
Implantable Pulse Generator Implant Date: 20170303
Lead Channel Impedance Value: 447 Ohm
Lead Channel Impedance Value: 506 Ohm
Lead Channel Pacing Threshold Amplitude: 0.5 V
Lead Channel Pacing Threshold Amplitude: 1.1 V
Lead Channel Pacing Threshold Pulse Width: 0.5 ms
Lead Channel Pacing Threshold Pulse Width: 0.5 ms
Lead Channel Setting Pacing Amplitude: 2 V
Lead Channel Setting Pacing Amplitude: 2.4 V
Lead Channel Setting Pacing Pulse Width: 0.5 ms
Lead Channel Setting Sensing Sensitivity: 0.6 mV
Pulse Gen Serial Number: 204398

## 2019-06-10 ENCOUNTER — Encounter: Payer: Self-pay | Admitting: Cardiology

## 2019-06-10 NOTE — Progress Notes (Signed)
Remote ICD transmission.   

## 2019-06-16 ENCOUNTER — Telehealth: Payer: Self-pay | Admitting: *Deleted

## 2019-06-16 NOTE — Telephone Encounter (Signed)

## 2019-06-17 ENCOUNTER — Ambulatory Visit (INDEPENDENT_AMBULATORY_CARE_PROVIDER_SITE_OTHER): Payer: Medicare PPO | Admitting: *Deleted

## 2019-06-17 DIAGNOSIS — D6861 Antiphospholipid syndrome: Secondary | ICD-10-CM

## 2019-06-17 DIAGNOSIS — D6859 Other primary thrombophilia: Secondary | ICD-10-CM

## 2019-06-17 DIAGNOSIS — Z5181 Encounter for therapeutic drug level monitoring: Secondary | ICD-10-CM

## 2019-06-17 DIAGNOSIS — I63549 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: Secondary | ICD-10-CM | POA: Diagnosis not present

## 2019-06-17 LAB — POCT INR: INR: 1.9 — AB (ref 2.0–3.0)

## 2019-06-17 NOTE — Patient Instructions (Signed)
Take coumadin 1 1/2 tablets tonight then increase dose to 1 tablet daily except 1/2 tablet on Mondays and Thursdays Has been diagnosed with Alpha Gal.   Keep greens consistant Recheck in 3 weeks

## 2019-06-22 ENCOUNTER — Ambulatory Visit (INDEPENDENT_AMBULATORY_CARE_PROVIDER_SITE_OTHER): Payer: Medicare PPO

## 2019-06-22 ENCOUNTER — Other Ambulatory Visit: Payer: Self-pay

## 2019-06-22 ENCOUNTER — Ambulatory Visit: Payer: Medicare PPO | Admitting: Orthopaedic Surgery

## 2019-06-22 ENCOUNTER — Encounter: Payer: Self-pay | Admitting: Orthopaedic Surgery

## 2019-06-22 VITALS — BP 113/63 | HR 66 | Temp 96.6°F | Ht 67.0 in | Wt 231.0 lb

## 2019-06-22 DIAGNOSIS — G8929 Other chronic pain: Secondary | ICD-10-CM

## 2019-06-22 DIAGNOSIS — M25562 Pain in left knee: Secondary | ICD-10-CM | POA: Diagnosis not present

## 2019-06-22 NOTE — Addendum Note (Signed)
Addended by: Derek Mound A on: 06/22/2019 02:25 PM   Modules accepted: Orders

## 2019-06-22 NOTE — Progress Notes (Signed)
Patient JQ:[name] [name], female DOB:05-28-1961, 58 y.o. FXT:[id number]  Chief Complaint  Patient presents with  . Knee Pain    left    HPI  [name] [name] is a 58 y.o. female who has pain in the left knee.  She had pain in the right knee in the past. She has swelling, popping but no giving way. She has no redness,no numbness.  She has pain in walking.  She has tried ice and heat.   Body mass index is 36.18 kg/m.  ROS  Review of Systems  Constitutional: Positive for activity change.  Musculoskeletal: Positive for arthralgias and back pain.  Neurological: Positive for headaches.  All other systems reviewed and are negative.   All other systems reviewed and are negative.  The following is a summary of the past history medically, past history surgically, known current medicines, social history and family history.  This information is gathered electronically by the computer from prior information and documentation.  I review this each visit and have found including this information at this point in the chart is beneficial and informative.    Past Medical History:  Diagnosis Date  . Aneurysm of internal carotid artery   . Anticardiolipin antibody positive    Chronic Coumadin  . Chronic combined systolic (congestive) and diastolic (congestive) heart failure ([place])   . Cocaine abuse in remission ([place])   . Coronary atherosclerosis of native coronary artery    Previous invasive cardiac testing was done at a facility in [place], [place].  Occluded diagonal, Diffuse LAD disease, Occluded Cx, and non obstructive RCA.   . Essential hypertension, benign   . Headache   . History of pneumonia   . Ischemic cardiomyopathy    LVEF 30-35%  . Mixed hyperlipidemia   . PVD (peripheral vascular disease) ([place])   . Raynaud's phenomenon   . Stroke [facility]) 2007   Total occlusion of the right internal carotid    Past Surgical History:  Procedure Laterality Date  . ABDOMINAL AORTAGRAM N/A  05/25/2014   Procedure: ABDOMINAL Maxcine Ham;  Surgeon: Wellington Hampshire, MD;  Location: [facility] CATH LAB;  Service: Cardiovascular;  Laterality: N/A;  . APPENDECTOMY    . CARDIAC DEFIBRILLATOR PLACEMENT     St.Jude ICD  . EP IMPLANTABLE DEVICE N/A 03/01/2016   Procedure: ICD Generator Changeout;  Surgeon: Evans Lance, MD;  Location: [facility] CV LAB;  Service: Cardiovascular;  Laterality: N/A;  . L1 corpectomy   12/10  . Laryngeal polyp excision      Family History  Problem Relation Age of Onset  . Heart disease Father 26  . Ankylosing spondylitis Sister   . Stomach cancer Brother 78  . Heart attack Brother     Social History Social History   Tobacco Use  . Smoking status: Current Every Day Smoker    Packs/day: 1.00    Years: 43.00    Pack years: 43.00    Types: E-cigarettes    Start date: 03/03/1978    Last attempt to quit: 10/03/2013    Years since quitting: 5.7  . Smokeless tobacco: Never Used  . Tobacco comment: resumed cigarettes, stopped last week but continues to vape  Substance Use Topics  . Alcohol use: No    Alcohol/week: 0.0 standard drinks  . Drug use: No    Comment: Prior history of cocaine    Allergies  Allergen Reactions  . Metoclopramide Hcl Anaphylaxis    Non responsive  . Penicillins Anaphylaxis    Pt  states she can take any cephalosporin (per pt. 01/01/19) Shock Has patient had a PCN reaction causing immediate rash, facial/tongue/throat swelling, SOB or lightheadedness with hypotension: Yes Has patient had a PCN reaction causing severe rash involving mucus membranes or skin necrosis: No Has patient had a PCN reaction that required hospitalization Yes Has patient had a PCN reaction occurring within the last 10 years: No If all of the above answers are "NO", then may proceed with Cephalosporin   . Metoprolol Other (See Comments)    Syncope   . Statins Other (See Comments)    Myalgias    Current Outpatient Medications  Medication Sig Dispense Refill   . carvedilol (COREG) 12.5 MG tablet TAKE 1 TABLET BY MOUTH TWICE DAILY WITH MEALS (Patient taking differently: Take 12.5 mg by mouth daily. ) 180 tablet 3  . clonazePAM (KLONOPIN) 1 MG tablet Take 1 mg by mouth 4 (four) times daily as needed for anxiety.    . DULoxetine (CYMBALTA) 60 MG capsule Take 60 mg by mouth every morning.    . famotidine (PEPCID) 20 MG tablet Take 1 tablet (20 mg total) by mouth 2 (two) times daily. 60 tablet 1  . furosemide (LASIX) 40 MG tablet TAKE 1 TABLET BY MOUTH DAILY AS NEEDED FOR SWELLING. 30 tablet 6  . furosemide (LASIX) 80 MG tablet TAKE 1 TABLET BY MOUTH DAILY 90 tablet 1  . gabapentin (NEURONTIN) 300 MG capsule Take 300 mg by mouth 2 (two) times daily.    Marland Kitchen. guaiFENesin (MUCINEX) 600 MG 12 hr tablet Take 2 tablets (1,200 mg total) by mouth 2 (two) times daily. 40 tablet 1  . HYDROcodone-acetaminophen (NORCO) 10-325 MG tablet Take 1 tablet by mouth 4 (four) times daily.    . Multiple Vitamins-Minerals (MULTIVITAMINS THER. W/MINERALS) TABS Take 1 tablet by mouth daily.      . nitroGLYCERIN (NITROSTAT) 0.4 MG SL tablet PLACE 1 TABLET UNDER THE TONGUE EVERY 5 MINUTES FOR 3 DOSES AS NEEDED CHEST PAIN 25 tablet 3  . potassium chloride SA (K-DUR,KLOR-CON) 20 MEQ tablet TAKE 1 TABLET BY MOUTH EVERY DAY 90 tablet 3  . promethazine (PHENERGAN) 25 MG tablet Take 25 mg by mouth every 6 (six) hours as needed for nausea.    . sacubitril-valsartan (ENTRESTO) 24-26 MG Take 1 tablet by mouth 2 (two) times daily. 60 tablet 6  . spironolactone (ALDACTONE) 25 MG tablet TAKE 1 TABLET BY MOUTH EVERY DAY 90 tablet 1  . warfarin (COUMADIN) 10 MG tablet Take 1 tablet daily except 1/2 tablet on Thursdays (Patient taking differently: Take 5-10 mg by mouth See admin instructions. TAKES 5MG  ON TUESDAYS AND THURSDAYS AND TAKES 10MG  ON ALL OTHER DAYS) 30 tablet 3   No current facility-administered medications for this visit.      Physical Exam  Blood pressure 113/63, pulse 66,  temperature (!) 96.6 F (35.9 C), height 5\' 7"  (1.702 m), weight 231 lb (104.8 kg).  Constitutional: overall normal hygiene, normal nutrition, well developed, normal grooming, normal body habitus. Assistive device:none  Musculoskeletal: gait and station Limp left, muscle tone and strength are normal, no tremors or atrophy is present.  .  Neurological: coordination overall normal.  Deep tendon reflex/nerve stretch intact.  Sensation normal.  Cranial nerves II-XII intact.   Skin:   Normal overall no scars, lesions, ulcers or rashes. No psoriasis.  Psychiatric: Alert and oriented x 3.  Recent memory intact, remote memory unclear.  Normal mood and affect. Well groomed.  Good eye contact.  Cardiovascular: overall  no swelling, no varicosities, no edema bilaterally, normal temperatures of the legs and arms, no clubbing, cyanosis and good capillary refill.  Lymphatic: palpation is normal.  Left knee tender, has slight effusion, crepitus, ROM 0 to 100, medial tenderness, stable.  NV intact.  All other systems reviewed and are negative   The patient has been educated about the nature of the problem(s) and counseled on treatment options.  The patient appeared to understand what I have discussed and is in agreement with it.  Encounter Diagnosis  Name Primary?  . Chronic pain of left knee Yes   X-rays were done of the left knee, reported separately.  PROCEDURE NOTE:  The patient requests injections of the left knee , verbal consent was obtained.  The left knee was prepped appropriately after time out was performed.   Sterile technique was observed and injection of 1 cc of Depo-Medrol 40 mg with several cc's of plain xylocaine. Anesthesia was provided by ethyl chloride and a 20-gauge needle was used to inject the knee area. The injection was tolerated well.  A band aid dressing was applied.  The patient was advised to apply ice later today and tomorrow to the injection sight as  needed.  PLAN Call if any problems.  Precautions discussed.  Continue current medications.   Return to clinic 2 weeks   She  May need CT scan arthrogram.  She cannot have MRI.  Call if any problem.  Precautions discussed.   Electronically Signed Darreld Mclean, MD 6/23/202012:02 PM

## 2019-06-22 NOTE — Patient Instructions (Signed)
Steps to Quit Smoking    Smoking tobacco can be bad for your health. It can also affect almost every organ in your body. Smoking puts you and people around you at risk for many serious long-lasting (chronic) diseases. Quitting smoking is hard, but it is one of the best things that you can do for your health. It is never too late to quit.  What are the benefits of quitting smoking?  When you quit smoking, you lower your risk for getting serious diseases and conditions. They can include:  · Lung cancer or lung disease.  · Heart disease.  · Stroke.  · Heart attack.  · Not being able to have children (infertility).  · Weak bones (osteoporosis) and broken bones (fractures).  If you have coughing, wheezing, and shortness of breath, those symptoms may get better when you quit. You may also get sick less often. If you are pregnant, quitting smoking can help to lower your chances of having a baby of low birth weight.  What can I do to help me quit smoking?  Talk with your doctor about what can help you quit smoking. Some things you can do (strategies) include:  · Quitting smoking totally, instead of slowly cutting back how much you smoke over a period of time.  · Going to in-person counseling. You are more likely to quit if you go to many counseling sessions.  · Using resources and support systems, such as:  ? Online chats with a counselor.  ? Phone quitlines.  ? Printed self-help materials.  ? Support groups or group counseling.  ? Text messaging programs.  ? Mobile phone apps or applications.  · Taking medicines. Some of these medicines may have nicotine in them. If you are pregnant or breastfeeding, do not take any medicines to quit smoking unless your doctor says it is okay. Talk with your doctor about counseling or other things that can help you.  Talk with your doctor about using more than one strategy at the same time, such as taking medicines while you are also going to in-person counseling. This can help make  quitting easier.  What things can I do to make it easier to quit?  Quitting smoking might feel very hard at first, but there is a lot that you can do to make it easier. Take these steps:  · Talk to your family and friends. Ask them to support and encourage you.  · Call phone quitlines, reach out to support groups, or work with a counselor.  · Ask people who smoke to not smoke around you.  · Avoid places that make you want (trigger) to smoke, such as:  ? Bars.  ? Parties.  ? Smoke-break areas at work.  · Spend time with people who do not smoke.  · Lower the stress in your life. Stress can make you want to smoke. Try these things to help your stress:  ? Getting regular exercise.  ? Deep-breathing exercises.  ? Yoga.  ? Meditating.  ? Doing a body scan. To do this, close your eyes, focus on one area of your body at a time from head to toe, and notice which parts of your body are tense. Try to relax the muscles in those areas.  · Download or buy apps on your mobile phone or tablet that can help you stick to your quit plan. There are many free apps, such as QuitGuide from the CDC (Centers for Disease Control and Prevention). You can find more   support from smokefree.gov and other websites.  This information is not intended to replace advice given to you by your health care provider. Make sure you discuss any questions you have with your health care provider.  Document Released: 10/12/2009 Document Revised: 08/13/2016 Document Reviewed: 05/02/2015  Elsevier Interactive Patient Education © 2019 Elsevier Inc.

## 2019-07-06 ENCOUNTER — Ambulatory Visit: Payer: Medicare PPO | Admitting: Orthopaedic Surgery

## 2019-07-08 ENCOUNTER — Ambulatory Visit (INDEPENDENT_AMBULATORY_CARE_PROVIDER_SITE_OTHER): Payer: Medicare PPO | Admitting: *Deleted

## 2019-07-08 DIAGNOSIS — I63549 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: Secondary | ICD-10-CM

## 2019-07-08 DIAGNOSIS — Z5181 Encounter for therapeutic drug level monitoring: Secondary | ICD-10-CM | POA: Diagnosis not present

## 2019-07-08 DIAGNOSIS — D6861 Antiphospholipid syndrome: Secondary | ICD-10-CM

## 2019-07-08 DIAGNOSIS — D6859 Other primary thrombophilia: Secondary | ICD-10-CM | POA: Diagnosis not present

## 2019-07-08 LAB — POCT INR: INR: 2.4 (ref 2.0–3.0)

## 2019-07-08 NOTE — Patient Instructions (Addendum)
Increase coumadin to 1 tablet daily except 1/2 tablet on Mondays  Has been diagnosed with Alpha Gal.   Keep greens consistant Recheck in 4 weeks

## 2019-07-15 ENCOUNTER — Other Ambulatory Visit: Payer: Self-pay | Admitting: Cardiology

## 2019-07-27 ENCOUNTER — Other Ambulatory Visit: Payer: Self-pay

## 2019-07-27 ENCOUNTER — Inpatient Hospital Stay (HOSPITAL_COMMUNITY): Payer: Medicare PPO | Attending: Hematology

## 2019-07-27 DIAGNOSIS — D696 Thrombocytopenia, unspecified: Secondary | ICD-10-CM | POA: Insufficient documentation

## 2019-07-27 LAB — COMPREHENSIVE METABOLIC PANEL
ALT: 16 U/L (ref 0–44)
AST: 20 U/L (ref 15–41)
Albumin: 3.9 g/dL (ref 3.5–5.0)
Alkaline Phosphatase: 66 U/L (ref 38–126)
Anion gap: 10 (ref 5–15)
BUN: 13 mg/dL (ref 6–20)
CO2: 28 mmol/L (ref 22–32)
Calcium: 8.8 mg/dL — ABNORMAL LOW (ref 8.9–10.3)
Chloride: 101 mmol/L (ref 98–111)
Creatinine, Ser: 0.77 mg/dL (ref 0.44–1.00)
GFR calc Af Amer: >60 mL/min (ref >60)
GFR calc non Af Amer: >60 mL/min (ref >60)
Glucose, Bld: 119 mg/dL — ABNORMAL HIGH (ref 70–99)
Potassium: 4.1 mmol/L (ref 3.5–5.1)
Sodium: 139 mmol/L (ref 135–145)
Total Bilirubin: 0.5 mg/dL (ref 0.3–1.2)
Total Protein: 7.5 g/dL (ref 6.5–8.1)

## 2019-07-27 LAB — CBC WITH DIFFERENTIAL/PLATELET
Abs Immature Granulocytes: 0.01 10*3/uL (ref 0.00–0.07)
Basophils Absolute: 0.1 10*3/uL (ref 0.0–0.1)
Basophils Relative: 1 %
Eosinophils Absolute: 0.2 10*3/uL (ref 0.0–0.5)
Eosinophils Relative: 3 %
HCT: 43.2 % (ref 36.0–46.0)
Hemoglobin: 13.7 g/dL (ref 12.0–15.0)
Immature Granulocytes: 0 %
Lymphocytes Relative: 33 %
Lymphs Abs: 2.1 10*3/uL (ref 0.7–4.0)
MCH: 29.1 pg (ref 26.0–34.0)
MCHC: 31.7 g/dL (ref 30.0–36.0)
MCV: 91.9 fL (ref 80.0–100.0)
Monocytes Absolute: 0.5 10*3/uL (ref 0.1–1.0)
Monocytes Relative: 7 %
Neutro Abs: 3.5 10*3/uL (ref 1.7–7.7)
Neutrophils Relative %: 56 %
Platelets: 173 10*3/uL (ref 150–400)
RBC: 4.7 MIL/uL (ref 3.87–5.11)
RDW: 13.2 % (ref 11.5–15.5)
WBC: 6.4 10*3/uL (ref 4.0–10.5)
nRBC: 0 % (ref 0.0–0.2)

## 2019-07-27 LAB — LACTATE DEHYDROGENASE: LDH: 146 U/L (ref 98–192)

## 2019-07-27 LAB — FERRITIN: Ferritin: 17 ng/mL (ref 11–307)

## 2019-07-30 ENCOUNTER — Other Ambulatory Visit: Payer: Self-pay

## 2019-07-30 ENCOUNTER — Encounter: Payer: Self-pay | Admitting: Cardiology

## 2019-07-30 ENCOUNTER — Telehealth (INDEPENDENT_AMBULATORY_CARE_PROVIDER_SITE_OTHER): Payer: Medicare PPO | Admitting: Cardiology

## 2019-07-30 VITALS — BP 120/78 | Ht 68.0 in | Wt 220.0 lb

## 2019-07-30 DIAGNOSIS — I255 Ischemic cardiomyopathy: Secondary | ICD-10-CM

## 2019-07-30 DIAGNOSIS — D6859 Other primary thrombophilia: Secondary | ICD-10-CM | POA: Diagnosis not present

## 2019-07-30 DIAGNOSIS — I5042 Chronic combined systolic (congestive) and diastolic (congestive) heart failure: Secondary | ICD-10-CM | POA: Diagnosis not present

## 2019-07-30 DIAGNOSIS — I5022 Chronic systolic (congestive) heart failure: Secondary | ICD-10-CM

## 2019-07-30 DIAGNOSIS — Z95810 Presence of automatic (implantable) cardiac defibrillator: Secondary | ICD-10-CM | POA: Diagnosis not present

## 2019-07-30 NOTE — Progress Notes (Signed)
Virtual Visit via Telephone Note   This visit type was conducted due to national recommendations for restrictions regarding the COVID-19 Pandemic (e.g. social distancing) in an effort to limit this patient's exposure and mitigate transmission in our community.  Due to her co-morbid illnesses, this patient is at least at moderate risk for complications without adequate follow up.  This format is felt to be most appropriate for this patient at this time.  The patient did not have access to video technology/had technical difficulties with video requiring transitioning to audio format only (telephone).  All issues noted in this document were discussed and addressed.  No physical exam could be performed with this format.  Please refer to the patient's chart for her  consent to telehealth for [facility].   Date:  07/30/2019   ID:  [name] MassySusan H [name], DOB 10/14/1961, MRN [id number]  Patient Location: Home Provider Location: Office  PCP:  Kari BaarsHawkins, Edward, MD  Cardiologist:  Nona DellSamuel Precilla Purnell, MD Electrophysiologist:  None   Evaluation Performed:  Follow-Up Visit  Chief Complaint:   Cardiac follow-up  History of Present Illness:    [name] [name] is a 58 y.o. female last seen in January.  We had difficulty with video access and spoke by phone.  She states that she has had no major change in symptoms, specifically no angina and stable NYHA class II dyspnea.  She has been bothered by leg pain, has an assessment by orthopedic specialist pending.  She is on Coumadin with follow-up in the anticoagulation clinic.  Last INR was 2.4.  She does not report any spontaneous bleeding problems.  She sees Dr. Ladona Ridgelaylor in the device clinic, St. Jude ICD in place.  She denies any shocks or syncope.  Follow-up echocardiogram from February is outlined below.  LVEF has been stable in the range of 20 to 25%.  We [facility] which she has tolerated.  I reviewed her recent follow-up lab work as  outlined below.  The patient does not have symptoms concerning for COVID-19 infection (fever, chills, cough, or new shortness of breath).    Past Medical History:  Diagnosis Date   Aneurysm of internal carotid artery    Anticardiolipin antibody positive    Chronic Coumadin   Chronic combined systolic (congestive) and diastolic (congestive) [name] failure (HCC)    Cocaine abuse in remission [facility](HCC)    Coronary atherosclerosis of native coronary artery    Previous invasive cardiac [place]  Occluded diagonal, Diffuse LAD disease, Occluded Cx, and non obstructive RCA.    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;  Service: Cardiovascular;  Laterality: N/A;   L1 corpectomy   12/10   Laryngeal polyp excision  Current Meds  Medication Sig   carvedilol (COREG) 12.5 MG tablet TAKE 1 TABLET BY MOUTH TWICE DAILY WITH MEALS (Patient taking differently: Take 12.5 mg by mouth daily. )   clonazePAM (KLONOPIN) 1 MG tablet Take 1 mg by mouth 4 (four) times daily as needed for anxiety.   DULoxetine (CYMBALTA) 60 MG capsule Take 60 mg by mouth every morning.   famotidine (PEPCID) 20 MG tablet Take 1 tablet (20 mg total) by mouth 2 (two) times daily.   furosemide (LASIX) 40 MG tablet  TAKE 1 TABLET BY MOUTH DAILY AS NEEDED FOR SWELLING.   furosemide (LASIX) 80 MG tablet TAKE 1 TABLET BY MOUTH DAILY (takes 80 AND 40 MG)   gabapentin (NEURONTIN) 300 MG capsule Take 300 mg by mouth 2 (two) times daily.   guaiFENesin (MUCINEX) 600 MG 12 hr tablet Take 2 tablets (1,200 mg total) by mouth 2 (two) times daily.   HYDROcodone-acetaminophen (NORCO) 10-325 MG tablet Take 1 tablet by mouth 4 (four) times daily.   Multiple Vitamins-Minerals (MULTIVITAMINS THER. W/MINERALS) TABS Take 1 tablet by mouth daily.     nitroGLYCERIN (NITROSTAT) 0.4 MG SL tablet PLACE 1 TABLET UNDER THE TONGUE EVERY 5 MINUTES FOR 3 DOSES AS NEEDED CHEST PAIN   potassium chloride SA (K-DUR) 20 MEQ tablet TAKE 1 TABLET BY MOUTH EVERY DAY   promethazine (PHENERGAN) 25 MG tablet Take 25 mg by mouth every 6 (six) hours as needed for nausea.   sacubitril-valsartan (ENTRESTO) 24-26 MG Take 1 tablet by mouth 2 (two) times daily.   spironolactone (ALDACTONE) 25 MG tablet TAKE 1 TABLET BY MOUTH EVERY DAY   warfarin (COUMADIN) 10 MG tablet Take 1 tablet daily except 1/2 tablet on Thursdays (Patient taking differently: Take 5-10 mg by mouth See admin instructions. TAKES 5MG  ON TUESDAYS AND THURSDAYS AND TAKES 10MG  ON ALL OTHER DAYS)     Allergies:   Metoclopramide hcl, Penicillins, Metoprolol, and Statins   Social History   Tobacco Use   Smoking status: Current Every Day Smoker    Packs/day: 1.00    Years: 43.00    Pack years: 43.00    Types: E-cigarettes    Start date: 03/03/1978    Last attempt to quit: 10/03/2013    Years since quitting: 5.8   Smokeless tobacco: Never Used   Tobacco comment: resumed cigarettes, stopped last week but continues to vape  Substance Use Topics   Alcohol use: No    Alcohol/week: 0.0 standard drinks   Drug use: No    Comment: Prior history of cocaine     Family Hx: The patient's family history includes Ankylosing spondylitis in her sister; [name] attack in her brother;  [name] disease (age of onset: 26) in her father; Stomach cancer (age of onset: 19) in her brother.  ROS:   Please see the history of present illness.  All other systems reviewed and are negative.   Prior CV studies:   The following studies were reviewed today:  Echocardiogram 02/18/2019:  1. The left ventricle has severely reduced systolic function, with an ejection fraction of 20-25%. The cavity size was moderately dilated. Left ventricular diastolic Doppler parameters are consistent with impaired relaxation. No obvious LV mural  thrombus.  2. There is akinesis of the left ventricular inferior wall and inferolateral wall.  3. The right ventricle has normal systolic function. The cavity was normal. There is no increase in right ventricular wall thickness. Device wire present.  4. Left atrial size was mild-moderately dilated.  5. The  aortic valve is tricuspid Moderate aortic annular calcification noted.  6. The mitral valve is degenerative. Mild thickening of the mitral valve leaflet. There is mild mitral annular calcification present. Mitral valve regurgitation is mild to moderate by color flow Doppler.  7. The aortic root is normal in size and structure.  8. The tricuspid valve is normal in structure.   Labs/Other Tests and Data Reviewed:    EKG:  An ECG dated 12/31/2018 was personally reviewed today and demonstrated:  Sinus rhythm with IVCD and PAC, old anteroseptal infarct and high lateral infarct pattern.  Recent Labs: 12/31/2018: B Natriuretic Peptide 523.0 01/01/2019: Magnesium 2.0 07/27/2019: ALT 16; BUN 13; Creatinine, Ser 0.77; Hemoglobin 13.7; Platelets 173; Potassium 4.1; Sodium 139   Recent Lipid Panel Lab Results  Component Value Date/Time   CHOL 147 06/19/2017 02:26 AM   TRIG 97 06/19/2017 02:26 AM   HDL 31 (L) 06/19/2017 02:26 AM   CHOLHDL 4.7 06/19/2017 02:26 AM   LDLCALC 97 06/19/2017 02:26 AM    Wt Readings from Last 3 Encounters:  07/30/19 220 lb (99.8 kg)    06/22/19 231 lb (104.8 kg)  03/30/19 215 lb (97.5 kg)     Objective:    Vital Signs:  BP 120/78    Ht 5\' 8"  (1.727 m)    Wt 220 lb (99.8 kg)    BMI 33.45 kg/m    Patient spoke in full sentences, not short of breath. No audible wheezing or coughing. Speech pattern normal.  ASSESSMENT & PLAN:    1.  Ischemic cardiomyopathy, LVEF 20 to 25% by echocardiogram back in February.  She is now on combination of Coreg, Entresto, Aldactone, and Lasix.  She does not report any progressive symptoms and her weight is actually down in terms of fluid status.  No changes made today.  2.  CAD with stable angina symptoms.  3.  Bilateral carotid artery disease with known occlusion of the R ICA.  She is asymptomatic at this time.  4.  Hypercoagulable state with anticardiolipin antibody.  She remains on Coumadin with follow-up in anticoagulation clinic.  COVID-19 Education: The signs and symptoms of COVID-19 were discussed with the patient and how to seek care for testing (follow up with PCP or arrange E-visit).  The importance of social distancing was discussed today.  Time:   Today, I have spent 8 minutes with the patient with telehealth technology discussing the above problems.     Medication Adjustments/Labs and Tests Ordered: Current medicines are reviewed at length with the patient today.  Concerns regarding medicines are outlined above.   Tests Ordered: No orders of the defined types were placed in this encounter.   Medication Changes: No orders of the defined types were placed in this encounter.   Follow Up:  In Person 4 months in the [place] office.  Signed, Rozann Lesches, MD  07/30/2019 3:15 PM    [place]

## 2019-07-30 NOTE — Patient Instructions (Signed)
Medication Instructions:  Continue all current medications.  Labwork: none  Testing/Procedures: none  Follow-Up: 4 months   Any Other Special Instructions Will Be Listed Below (If Applicable).  If you need a refill on your cardiac medications before your next appointment, please call your pharmacy.\ 

## 2019-08-03 ENCOUNTER — Encounter: Payer: Self-pay | Admitting: Orthopaedic Surgery

## 2019-08-03 ENCOUNTER — Ambulatory Visit (HOSPITAL_COMMUNITY): Payer: Medicare PPO | Admitting: Hematology

## 2019-08-03 ENCOUNTER — Ambulatory Visit (INDEPENDENT_AMBULATORY_CARE_PROVIDER_SITE_OTHER): Payer: Medicare PPO | Admitting: Orthopaedic Surgery

## 2019-08-03 ENCOUNTER — Other Ambulatory Visit: Payer: Self-pay

## 2019-08-03 DIAGNOSIS — F17210 Nicotine dependence, cigarettes, uncomplicated: Secondary | ICD-10-CM

## 2019-08-03 DIAGNOSIS — M25562 Pain in left knee: Secondary | ICD-10-CM | POA: Diagnosis not present

## 2019-08-03 DIAGNOSIS — G8929 Other chronic pain: Secondary | ICD-10-CM

## 2019-08-03 NOTE — Patient Instructions (Signed)
Steps to Quit Smoking Smoking tobacco is the leading cause of preventable death. It can affect almost every organ in the body. Smoking puts you and people around you at risk for many serious, long-lasting (chronic) diseases. Quitting smoking can be hard, but it is one of the best things that you can do for your health. It is never too late to quit. How do I get ready to quit? When you decide to quit smoking, make a plan to help you succeed. Before you quit:  Pick a date to quit. Set a date within the next 2 weeks to give you time to prepare.  Write down the reasons why you are quitting. Keep this list in places where you will see it often.  Tell your family, friends, and co-workers that you are quitting. Their support is important.  Talk with your doctor about the choices that may help you quit.  Find out if your health insurance will pay for these treatments.  Know the people, places, things, and activities that make you want to smoke (triggers). Avoid them. What first steps can I take to quit smoking?  Throw away all cigarettes at home, at work, and in your car.  Throw away the things that you use when you smoke, such as ashtrays and lighters.  Clean your car. Make sure to empty the ashtray.  Clean your home, including curtains and carpets. What can I do to help me quit smoking? Talk with your doctor about taking medicines and seeing a counselor at the same time. You are more likely to succeed when you do both.  If you are pregnant or breastfeeding, talk with your doctor about counseling or other ways to quit smoking. Do not take medicine to help you quit smoking unless your doctor tells you to do so. To quit smoking: Quit right away  Quit smoking totally, instead of slowly cutting back on how much you smoke over a period of time.  Go to counseling. You are more likely to quit if you go to counseling sessions regularly. Take medicine You may take medicines to help you quit. Some  medicines need a prescription, and some you can buy over-the-counter. Some medicines may contain a drug called nicotine to replace the nicotine in cigarettes. Medicines may:  Help you to stop having the desire to smoke (cravings).  Help to stop the problems that come when you stop smoking (withdrawal symptoms). Your doctor may ask you to use:  Nicotine patches, gum, or lozenges.  Nicotine inhalers or sprays.  Non-nicotine medicine that is taken by mouth. Find resources Find resources and other ways to help you quit smoking and remain smoke-free after you quit. These resources are most helpful when you use them often. They include:  Online chats with a counselor.  Phone quitlines.  Printed self-help materials.  Support groups or group counseling.  Text messaging programs.  Mobile phone apps. Use apps on your mobile phone or tablet that can help you stick to your quit plan. There are many free apps for mobile phones and tablets as well as websites. Examples include Quit Guide from the CDC and smokefree.gov  What things can I do to make it easier to quit?   Talk to your family and friends. Ask them to support and encourage you.  Call a phone quitline (1-800-QUIT-NOW), reach out to support groups, or work with a counselor.  Ask people who smoke to not smoke around you.  Avoid places that make you want to smoke,   such as: ? Bars. ? Parties. ? Smoke-break areas at work.  Spend time with people who do not smoke.  Lower the stress in your life. Stress can make you want to smoke. Try these things to help your stress: ? Getting regular exercise. ? Doing deep-breathing exercises. ? Doing yoga. ? Meditating. ? Doing a body scan. To do this, close your eyes, focus on one area of your body at a time from head to toe. Notice which parts of your body are tense. Try to relax the muscles in those areas. How will I feel when I quit smoking? Day 1 to 3 weeks Within the first 24 hours,  you may start to have some problems that come from quitting tobacco. These problems are very bad 2-3 days after you quit, but they do not often last for more than 2-3 weeks. You may get these symptoms:  Mood swings.  Feeling restless, nervous, angry, or annoyed.  Trouble concentrating.  Dizziness.  Strong desire for high-sugar foods and nicotine.  Weight gain.  Trouble pooping (constipation).  Feeling like you may vomit (nausea).  Coughing or a sore throat.  Changes in how the medicines that you take for other issues work in your body.  Depression.  Trouble sleeping (insomnia). Week 3 and afterward After the first 2-3 weeks of quitting, you may start to notice more positive results, such as:  Better sense of smell and taste.  Less coughing and sore throat.  Slower heart rate.  Lower blood pressure.  Clearer skin.  Better breathing.  Fewer sick days. Quitting smoking can be hard. Do not give up if you fail the first time. Some people need to try a few times before they succeed. Do your best to stick to your quit plan, and talk with your doctor if you have any questions or concerns. Summary  Smoking tobacco is the leading cause of preventable death. Quitting smoking can be hard, but it is one of the best things that you can do for your health.  When you decide to quit smoking, make a plan to help you succeed.  Quit smoking right away, not slowly over a period of time.  When you start quitting, seek help from your doctor, family, or friends. This information is not intended to replace advice given to you by your health care provider. Make sure you discuss any questions you have with your health care provider. Document Released: 10/12/2009 Document Revised: 03/05/2019 Document Reviewed: 03/06/2019 Elsevier Patient Education  2020 Elsevier Inc.  

## 2019-08-03 NOTE — Progress Notes (Signed)
CC:  I have pain of my left knee. I would like an injection.  The patient has chronic pain of the left knee.  There is no recent trauma.  There is no redness.  Injections in the past have helped.  The knee has no redness, has an effusion and crepitus present.  ROM of the left knee is 5-105.  Impression:  Chronic knee pain left  Return: after arthrogram of the leftknee  PROCEDURE NOTE:  The patient requests injections of the left knee, verbal consent was obtained.  The left knee was prepped appropriately after time out was performed.   Sterile technique was observed and injection of 1 cc of Depo-Medrol 40 mg with several cc's of plain xylocaine. Anesthesia was provided by ethyl chloride and a 20-gauge needle was used to inject the knee area. The injection was tolerated well.  A band aid dressing was applied.  The patient was advised to apply ice later today and tomorrow to the injection sight as needed.  I am concerned about the knee unable to fully extend.  I will order arthrogram.  She is on Coumadin and we will have to have that stopped first. She is to see her doctor about that in two days.  Return after arthrogram.  Electronically Signed Sanjuana Kava, MD 8/4/202010:11 AM

## 2019-08-04 ENCOUNTER — Telehealth: Payer: Self-pay | Admitting: Orthopaedic Surgery

## 2019-08-04 NOTE — Telephone Encounter (Signed)
Patient called this afternoon at 4:40 stating that she had a cortisone injection yesterday.  She said that today there was a knot above her knee.  She said it was painful, swollen and she couldn't hardly stand on it.  I asked her if she had tried putting ice on it and she said she had not.  I suggested that she try doing this tonight and I will send a message to Dr. Luna Glasgow to see what other suggestions he has.  She will call us back in the morning and let us know how she is doing and to see what Dr. Luna Glasgow said.

## 2019-08-05 ENCOUNTER — Telehealth: Payer: Self-pay | Admitting: Orthopaedic Surgery

## 2019-08-05 ENCOUNTER — Ambulatory Visit (INDEPENDENT_AMBULATORY_CARE_PROVIDER_SITE_OTHER): Payer: Medicare PPO | Admitting: *Deleted

## 2019-08-05 ENCOUNTER — Other Ambulatory Visit: Payer: Self-pay

## 2019-08-05 DIAGNOSIS — I63549 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: Secondary | ICD-10-CM | POA: Diagnosis not present

## 2019-08-05 DIAGNOSIS — Z5181 Encounter for therapeutic drug level monitoring: Secondary | ICD-10-CM

## 2019-08-05 DIAGNOSIS — D6859 Other primary thrombophilia: Secondary | ICD-10-CM

## 2019-08-05 LAB — POCT INR: INR: 4 — AB (ref 2.0–3.0)

## 2019-08-05 NOTE — Patient Instructions (Signed)
Hold coumadin tonight then resume 1 tablet daily except 1/2 tablet on Mondays  Had cortisone shot in knee yesterday Has been diagnosed with Alpha Gal.   Keep greens consistant Recheck in 3 weeks

## 2019-08-05 NOTE — Telephone Encounter (Signed)
Patient called back today, 08/05/19, to relay that she has seen her cardiologist Dr Domenic Polite. As for the information related to her Coumadin (said her arthrogram is pending), patient states we can fax the information requested to the attention of "Coumadin nurse" Edrick Oh, RN, at Fax#[phone].  States Inez Catalina is aware that she would be calling to provide the contact information.

## 2019-08-05 NOTE — Telephone Encounter (Signed)
I can see her if she desires.

## 2019-08-05 NOTE — Telephone Encounter (Signed)
Done. [name] [name] with patient again today (separate phone note entered, separate question).

## 2019-08-10 ENCOUNTER — Telehealth: Payer: Self-pay | Admitting: Cardiology

## 2019-08-10 NOTE — Telephone Encounter (Signed)
Spoke with [facility].  Dr Luna Glasgow wants patient to have an arthroscopy of left knee.  Needs to be off coumadin.  Please fax recommendations to office at [phone].   [name], Please advise.  Hold coumadin 5 days and bridge with Lovenox?

## 2019-08-10 NOTE — Telephone Encounter (Signed)
Please return [name] [name]'s call

## 2019-08-10 NOTE — Telephone Encounter (Signed)
Pt takes warfarin for antiphospholipid syndrome, also has hx of stroke. Pt has historically been bridged with Lovenox periprocedurally. Recommend holding warfarin for 5 days and bridging with Lovenox due to elevated stroke risk.

## 2019-08-11 NOTE — Telephone Encounter (Signed)
Below information faxed to [facility] at Dr Brooke Bonito office.

## 2019-08-17 ENCOUNTER — Other Ambulatory Visit (HOSPITAL_COMMUNITY): Payer: Self-pay | Admitting: Pulmonary Disease

## 2019-08-17 ENCOUNTER — Other Ambulatory Visit: Payer: Self-pay | Admitting: Pulmonary Disease

## 2019-08-17 DIAGNOSIS — Z95810 Presence of automatic (implantable) cardiac defibrillator: Secondary | ICD-10-CM | POA: Diagnosis not present

## 2019-08-17 DIAGNOSIS — I25119 Atherosclerotic heart disease of native coronary artery with unspecified angina pectoris: Secondary | ICD-10-CM | POA: Diagnosis not present

## 2019-08-17 DIAGNOSIS — M170 Bilateral primary osteoarthritis of knee: Secondary | ICD-10-CM | POA: Diagnosis not present

## 2019-08-17 DIAGNOSIS — M545 Low back pain: Secondary | ICD-10-CM | POA: Diagnosis not present

## 2019-08-17 DIAGNOSIS — M7989 Other specified soft tissue disorders: Secondary | ICD-10-CM

## 2019-08-17 DIAGNOSIS — M79662 Pain in left lower leg: Secondary | ICD-10-CM

## 2019-08-17 DIAGNOSIS — M79605 Pain in left leg: Secondary | ICD-10-CM

## 2019-08-18 ENCOUNTER — Other Ambulatory Visit: Payer: Self-pay

## 2019-08-18 ENCOUNTER — Telehealth: Payer: Self-pay | Admitting: Orthopaedic Surgery

## 2019-08-18 ENCOUNTER — Ambulatory Visit (HOSPITAL_COMMUNITY)
Admission: RE | Admit: 2019-08-18 | Discharge: 2019-08-18 | Disposition: A | Discharge status: Home / Self Care | Payer: Medicare PPO | Source: Ambulatory Visit | Attending: Pulmonary Disease | Admitting: Pulmonary Disease

## 2019-08-18 DIAGNOSIS — R600 Localized edema: Secondary | ICD-10-CM | POA: Diagnosis not present

## 2019-08-18 DIAGNOSIS — M79605 Pain in left leg: Secondary | ICD-10-CM | POA: Insufficient documentation

## 2019-08-18 DIAGNOSIS — M79662 Pain in left lower leg: Secondary | ICD-10-CM | POA: Diagnosis not present

## 2019-08-18 DIAGNOSIS — M7989 Other specified soft tissue disorders: Secondary | ICD-10-CM | POA: Diagnosis not present

## 2019-08-18 NOTE — Telephone Encounter (Signed)
Noted.  If you can get the results of the doppler after it is done, let me know.

## 2019-08-18 NOTE — Telephone Encounter (Signed)
Patient called to relay that she had seen her primary care, Dr Luan Pulling, yesterday, 08/17/19, and that he has ordered an ultrasound of left lower leg, due to continued swelling, pain, and "bruising".  Said she wanted to let Dr Luna Glasgow know, as she did when she called (08/04/19) after being seen here for injection 08/03/19. (Appointment had been offered then for patient to come back in, and offered again, to come back in to see Dr Luna Glasgow).

## 2019-08-23 ENCOUNTER — Telehealth: Payer: Self-pay

## 2019-08-23 NOTE — Telephone Encounter (Signed)
We have clearance for this patient to have a Arthrogram of her knee. Do you want a CT Arthrogram or just Arthrogram?   Please advise   Her Ultrasound (that Dr. Luan Pulling ordered)report is in Epic 08/18/19

## 2019-08-24 ENCOUNTER — Other Ambulatory Visit (HOSPITAL_COMMUNITY): Payer: Self-pay | Admitting: Pulmonary Disease

## 2019-08-24 ENCOUNTER — Telehealth: Payer: Self-pay | Admitting: *Deleted

## 2019-08-24 DIAGNOSIS — D6859 Other primary thrombophilia: Secondary | ICD-10-CM

## 2019-08-24 DIAGNOSIS — M79652 Pain in left thigh: Secondary | ICD-10-CM

## 2019-08-24 DIAGNOSIS — I63549 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: Secondary | ICD-10-CM

## 2019-08-24 DIAGNOSIS — M7989 Other specified soft tissue disorders: Secondary | ICD-10-CM

## 2019-08-24 DIAGNOSIS — Z5181 Encounter for therapeutic drug level monitoring: Secondary | ICD-10-CM

## 2019-08-24 NOTE — Telephone Encounter (Signed)
Missed INR appt in office.  Wants order sent to [facility] lab to get lab checked tomorrow due to increased bruising on legs after ultrasound.  Order for INR placed in Epic.  Pt will have drawn on 08/25/19.

## 2019-08-24 NOTE — Telephone Encounter (Signed)
Dr Luna Glasgow aware results are in [facility] Epic chart (refer to separate notes entered).

## 2019-08-25 ENCOUNTER — Encounter (HOSPITAL_COMMUNITY): Payer: Self-pay

## 2019-08-25 ENCOUNTER — Ambulatory Visit (HOSPITAL_COMMUNITY): Admission: RE | Admit: 2019-08-25 | Payer: Medicare PPO | Source: Ambulatory Visit

## 2019-08-26 ENCOUNTER — Other Ambulatory Visit: Payer: Self-pay | Admitting: Cardiology

## 2019-08-30 DIAGNOSIS — I63549 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: Secondary | ICD-10-CM | POA: Diagnosis not present

## 2019-08-30 DIAGNOSIS — Z5181 Encounter for therapeutic drug level monitoring: Secondary | ICD-10-CM | POA: Diagnosis not present

## 2019-08-30 DIAGNOSIS — D6859 Other primary thrombophilia: Secondary | ICD-10-CM | POA: Diagnosis not present

## 2019-08-30 LAB — PROTIME-INR
INR: 2.3 — ABNORMAL HIGH
Prothrombin Time: 22.2 s — ABNORMAL HIGH (ref 9.0–11.5)

## 2019-08-30 NOTE — Telephone Encounter (Signed)
CT.  Always CT.

## 2019-08-31 ENCOUNTER — Ambulatory Visit (INDEPENDENT_AMBULATORY_CARE_PROVIDER_SITE_OTHER): Payer: Medicare PPO | Admitting: *Deleted

## 2019-08-31 DIAGNOSIS — D6861 Antiphospholipid syndrome: Secondary | ICD-10-CM | POA: Diagnosis not present

## 2019-08-31 DIAGNOSIS — Z5181 Encounter for therapeutic drug level monitoring: Secondary | ICD-10-CM | POA: Diagnosis not present

## 2019-08-31 NOTE — Patient Instructions (Signed)
Continue coumadin 1 tablet daily except 1/2 tablet on Mondays  Has been diagnosed with Alpha Gal.   Keep greens consistant Recheck in 2 weeks

## 2019-09-02 NOTE — Addendum Note (Signed)
Addended by: Derek Mound A on: 09/02/2019 04:52 PM   Modules accepted: Orders

## 2019-09-03 ENCOUNTER — Ambulatory Visit (INDEPENDENT_AMBULATORY_CARE_PROVIDER_SITE_OTHER): Payer: Medicare PPO | Admitting: *Deleted

## 2019-09-03 DIAGNOSIS — I255 Ischemic cardiomyopathy: Secondary | ICD-10-CM

## 2019-09-03 LAB — CUP PACEART REMOTE DEVICE CHECK
Battery Remaining Longevity: 126 mo
Battery Remaining Percentage: 100 %
Brady Statistic RA Percent Paced: 0 %
Brady Statistic RV Percent Paced: 0 %
Date Time Interrogation Session: 20200903232200
HighPow Impedance: 53 Ohm
Implantable Lead Implant Date: 20081119
Implantable Lead Implant Date: 20081119
Implantable Lead Location: 753859
Implantable Lead Location: 753860
Implantable Lead Model: 185
Implantable Lead Model: 4136
Implantable Lead Serial Number: 211124
Implantable Lead Serial Number: 28383796
Implantable Pulse Generator Implant Date: 20170303
Lead Channel Impedance Value: 551 Ohm
Lead Channel Impedance Value: 566 Ohm
Lead Channel Pacing Threshold Amplitude: 0.5 V
Lead Channel Pacing Threshold Amplitude: 1.1 V
Lead Channel Pacing Threshold Pulse Width: 0.5 ms
Lead Channel Pacing Threshold Pulse Width: 0.5 ms
Lead Channel Setting Pacing Amplitude: 2 V
Lead Channel Setting Pacing Amplitude: 2.4 V
Lead Channel Setting Pacing Pulse Width: 0.5 ms
Lead Channel Setting Sensing Sensitivity: 0.6 mV
Pulse Gen Serial Number: 204398

## 2019-09-07 ENCOUNTER — Ambulatory Visit: Payer: Medicare PPO | Admitting: Orthopaedic Surgery

## 2019-09-09 ENCOUNTER — Ambulatory Visit (INDEPENDENT_AMBULATORY_CARE_PROVIDER_SITE_OTHER): Payer: Medicare PPO | Admitting: *Deleted

## 2019-09-09 ENCOUNTER — Other Ambulatory Visit: Payer: Self-pay

## 2019-09-09 DIAGNOSIS — Z5181 Encounter for therapeutic drug level monitoring: Secondary | ICD-10-CM

## 2019-09-09 DIAGNOSIS — I63549 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: Secondary | ICD-10-CM

## 2019-09-09 DIAGNOSIS — D6859 Other primary thrombophilia: Secondary | ICD-10-CM

## 2019-09-09 LAB — POCT INR: INR: 4 — AB (ref 2.0–3.0)

## 2019-09-09 NOTE — Patient Instructions (Signed)
Decrease coumadin to 1 tablet daily except 1/2 tablet on Mondays and Thursdays Has been diagnosed with Alpha Gal.   Keep greens consistant Recheck in 3 weeks

## 2019-09-14 ENCOUNTER — Other Ambulatory Visit: Payer: Self-pay

## 2019-09-14 ENCOUNTER — Encounter: Payer: Self-pay | Admitting: Orthopaedic Surgery

## 2019-09-14 ENCOUNTER — Ambulatory Visit (INDEPENDENT_AMBULATORY_CARE_PROVIDER_SITE_OTHER): Payer: Medicare PPO | Admitting: Orthopaedic Surgery

## 2019-09-14 VITALS — BP 107/66 | HR 87 | Ht 68.0 in | Wt 220.0 lb

## 2019-09-14 DIAGNOSIS — G8929 Other chronic pain: Secondary | ICD-10-CM

## 2019-09-14 DIAGNOSIS — F17210 Nicotine dependence, cigarettes, uncomplicated: Secondary | ICD-10-CM | POA: Diagnosis not present

## 2019-09-14 DIAGNOSIS — M25562 Pain in left knee: Secondary | ICD-10-CM

## 2019-09-14 DIAGNOSIS — Z7901 Long term (current) use of anticoagulants: Secondary | ICD-10-CM

## 2019-09-14 NOTE — Progress Notes (Signed)
Patient [name] [name], female DOB:July 22, 1961, 58 y.o. [id number]  Chief Complaint  Patient presents with  . Knee Pain    bilateral     HPI  [name] [name] is a 58 y.o. female who has persistent pain of the left knee.  She needs CT arthrogram of the left knee. This has not been approved by insurance yet.  She had reaction to the last knee injection and went to ER and had Doppler which was negative.   Body mass index is 33.45 kg/m.  ROS  Review of Systems  Constitutional: Positive for activity change.  Musculoskeletal: Positive for arthralgias and back pain.  Neurological: Positive for headaches.  All other systems reviewed and are negative.   All other systems reviewed and are negative.  The following is a summary of the past history medically, past history surgically, known current medicines, social history and family history.  This information is gathered electronically by the computer from prior information and documentation.  I review this each visit and have found including this information at this point in the chart is beneficial and informative.    Past Medical History:  Diagnosis Date  . Aneurysm of internal carotid artery   . Anticardiolipin antibody positive    Chronic Coumadin  . Chronic combined systolic (congestive) and diastolic (congestive) heart failure (HCC)   . Cocaine abuse in remission (HCC)   . Coronary atherosclerosis of native coronary artery    Previous invasive cardiac testing was done at a facility in [place], [place].  Occluded diagonal, Diffuse LAD disease, Occluded Cx, and non obstructive RCA.   . Essential hypertension, benign   . Headache   . History of pneumonia   . Ischemic cardiomyopathy    LVEF 30-35%  . Mixed hyperlipidemia   . PVD (peripheral vascular disease) (HCC)   . Raynaud's phenomenon   . Stroke [facility]) 2007   Total occlusion of the right internal carotid    Past Surgical History:  Procedure Laterality Date  .  ABDOMINAL AORTAGRAM N/A 05/25/2014   Procedure: ABDOMINAL Ronny Flurry;  Surgeon: Iran Ouch, MD;  Location: [facility] CATH LAB;  Service: Cardiovascular;  Laterality: N/A;  . APPENDECTOMY    . CARDIAC DEFIBRILLATOR PLACEMENT     [employer]ude ICD  . EP IMPLANTABLE DEVICE N/A 03/01/2016   Procedure: ICD Generator Changeout;  Surgeon: [name], MD;  Location: [facility] INVASIVE CV LAB;  Service: Cardiovascular;  Laterality: N/A;  . L1 corpectomy   12/10  . Laryngeal polyp excision      Family History  Problem Relation Age of Onset  . Heart disease Father 1  . Ankylosing spondylitis Sister   . Stomach cancer Brother 24  . Heart attack Brother     Social History Social History   Tobacco Use  . Smoking status: Current Every Day Smoker    Packs/day: 1.00    Years: 43.00    Pack years: 43.00    Types: E-cigarettes    Start date: 03/03/1978    Last attempt to quit: 10/03/2013    Years since quitting: 5.9  . Smokeless tobacco: Never Used  . Tobacco comment: resumed cigarettes, stopped last week but continues to vape  Substance Use Topics  . Alcohol use: No    Alcohol/week: 0.0 standard drinks  . Drug use: No    Comment: Prior history of cocaine    Allergies  Allergen Reactions  . Metoclopramide Hcl Anaphylaxis    Non responsive  . Penicillins Anaphylaxis  Pt states she can take any cephalosporin (per pt. 01/01/19) Shock Has patient had a PCN reaction causing immediate rash, facial/tongue/throat swelling, SOB or lightheadedness with hypotension: Yes Has patient had a PCN reaction causing severe rash involving mucus membranes or skin necrosis: No Has patient had a PCN reaction that required hospitalization Yes Has patient had a PCN reaction occurring within the last 10 years: No If all of the above answers are "NO", then may proceed with Cephalosporin   . Metoprolol Other (See Comments)    Syncope   . Statins Other (See Comments)    Myalgias    Current Outpatient Medications   Medication Sig Dispense Refill  . carvedilol (COREG) 12.5 MG tablet TAKE 1 TABLET BY MOUTH TWICE DAILY WITH MEALS (Patient taking differently: Take 12.5 mg by mouth daily. ) 180 tablet 3  . clonazePAM (KLONOPIN) 1 MG tablet Take 1 mg by mouth 4 (four) times daily as needed for anxiety.    . DULoxetine (CYMBALTA) 60 MG capsule Take 60 mg by mouth every morning.    Marland Kitchen ENTRESTO 24-26 MG TAKE 1 TABLET BY MOUTH TWICE DAILY - STOP LOSARTAN & IMDUR 60 tablet 6  . famotidine (PEPCID) 20 MG tablet Take 1 tablet (20 mg total) by mouth 2 (two) times daily. 60 tablet 1  . furosemide (LASIX) 40 MG tablet TAKE 1 TABLET BY MOUTH DAILY AS NEEDED FOR SWELLING. 30 tablet 6  . furosemide (LASIX) 80 MG tablet TAKE 1 TABLET BY MOUTH DAILY (takes 80 AND 40 MG) 90 tablet 1  . gabapentin (NEURONTIN) 300 MG capsule Take 300 mg by mouth 2 (two) times daily.    Marland Kitchen guaiFENesin (MUCINEX) 600 MG 12 hr tablet Take 2 tablets (1,200 mg total) by mouth 2 (two) times daily. 40 tablet 1  . HYDROcodone-acetaminophen (NORCO) 10-325 MG tablet Take 1 tablet by mouth 4 (four) times daily.    . Multiple Vitamins-Minerals (MULTIVITAMINS THER. W/MINERALS) TABS Take 1 tablet by mouth daily.      . nitroGLYCERIN (NITROSTAT) 0.4 MG SL tablet PLACE 1 TABLET UNDER THE TONGUE EVERY 5 MINUTES FOR 3 DOSES AS NEEDED CHEST PAIN 25 tablet 3  . potassium chloride SA (K-DUR) 20 MEQ tablet TAKE 1 TABLET BY MOUTH EVERY DAY 90 tablet 1  . promethazine (PHENERGAN) 25 MG tablet Take 25 mg by mouth every 6 (six) hours as needed for nausea.    Marland Kitchen spironolactone (ALDACTONE) 25 MG tablet TAKE 1 TABLET BY MOUTH EVERY DAY 90 tablet 1  . warfarin (COUMADIN) 10 MG tablet Take 1 tablet daily except 1/2 tablet on Thursdays (Patient taking differently: Take 5-10 mg by mouth See admin instructions. TAKES 5MG  ON TUESDAYS AND THURSDAYS AND TAKES 10MG  ON ALL OTHER DAYS) 30 tablet 3  . clonazePAM (KLONOPIN) 1 MG tablet Take 1 mg by mouth 4 (four) times daily as needed for  anxiety.    . DULoxetine (CYMBALTA) 60 MG capsule Take 60 mg by mouth every morning.    . famotidine (PEPCID) 20 MG tablet Take 1 tablet (20 mg total) by mouth 2 (two) times daily. 60 tablet 1  . furosemide (LASIX) 40 MG tablet TAKE 1 TABLET BY MOUTH DAILY AS NEEDED FOR SWELLING. 30 tablet 6  . gabapentin (NEURONTIN) 300 MG capsule Take 300 mg by mouth 2 (two) times daily.    . nitroGLYCERIN (NITROSTAT) 0.4 MG SL tablet PLACE 1 TABLET UNDER THE TONGUE EVERY 5 MINUTES FOR 3 DOSES AS NEEDED CHEST PAIN 25 tablet 3  . potassium chloride  SA (K-DUR) 20 MEQ tablet TAKE 1 TABLET BY MOUTH EVERY DAY 90 tablet 1  . promethazine (PHENERGAN) 25 MG tablet Take 25 mg by mouth every 6 (six) hours as needed for nausea.    Marland Kitchen. spironolactone (ALDACTONE) 25 MG tablet TAKE 1 TABLET BY MOUTH EVERY DAY 90 tablet 1  . warfarin (COUMADIN) 10 MG tablet Take 1 tablet daily except 1/2 tablet on Thursdays (Patient taking differently: Take 5-10 mg by mouth See admin instructions. TAKES 5MG  ON TUESDAYS AND THURSDAYS AND TAKES 10MG  ON ALL OTHER DAYS) 30 tablet 3   No current facility-administered medications for this visit.      Physical Exam  Blood pressure 107/66, pulse 87, height 5\' 8"  (1.727 m), weight 220 lb (99.8 kg).  Constitutional: overall normal hygiene, normal nutrition, well developed, normal grooming, normal body habitus. Assistive device:none  Musculoskeletal: gait and station Limp left, muscle tone and strength are normal, no tremors or atrophy is present.  .  Neurological: coordination overall normal.  Deep tendon reflex/nerve stretch intact.  Sensation normal.  Cranial nerves II-XII intact.   Skin:   Normal overall no scars, lesions, ulcers or rashes. No psoriasis.  Psychiatric: Alert and oriented x 3.  Recent memory intact, remote memory unclear.  Normal mood and affect. Well groomed.  Good eye contact.  Cardiovascular: overall no swelling, no varicosities, no edema bilaterally, normal temperatures of  the legs and arms, no clubbing, cyanosis and good capillary refill.  Lymphatic: palpation is normal.  Left knee has some effusion, NV intact, pain medially, positive medial McMurray, ROM 0 to 105 with pain.    All other systems reviewed and are negative   The patient has been educated about the nature of the problem(s) and counseled on treatment options.  The patient appeared to understand what I have discussed and is in agreement with it.  Encounter Diagnoses  Name Primary?  . Chronic pain of left knee Yes  . Cigarette nicotine dependence without complication   . Anticoagulated on Coumadin     PLAN Call if any problems.  Precautions discussed.  Continue current medications.   Return to clinic 3 weeks   Get CT arthrogram of the left knee.  Electronically Signed Darreld McleanWayne Kelechi Astarita, MD 9/15/202011:25 AM

## 2019-09-14 NOTE — Patient Instructions (Signed)
Steps to Quit Smoking Smoking tobacco is the leading cause of preventable death. It can affect almost every organ in the body. Smoking puts you and people around you at risk for many serious, long-lasting (chronic) diseases. Quitting smoking can be hard, but it is one of the best things that you can do for your health. It is never too late to quit. How do I get ready to quit? When you decide to quit smoking, make a plan to help you succeed. Before you quit:  Pick a date to quit. Set a date within the next 2 weeks to give you time to prepare.  Write down the reasons why you are quitting. Keep this list in places where you will see it often.  Tell your family, friends, and co-workers that you are quitting. Their support is important.  Talk with your doctor about the choices that may help you quit.  Find out if your health insurance will pay for these treatments.  Know the people, places, things, and activities that make you want to smoke (triggers). Avoid them. What first steps can I take to quit smoking?  Throw away all cigarettes at home, at work, and in your car.  Throw away the things that you use when you smoke, such as ashtrays and lighters.  Clean your car. Make sure to empty the ashtray.  Clean your home, including curtains and carpets. What can I do to help me quit smoking? Talk with your doctor about taking medicines and seeing a counselor at the same time. You are more likely to succeed when you do both.  If you are pregnant or breastfeeding, talk with your doctor about counseling or other ways to quit smoking. Do not take medicine to help you quit smoking unless your doctor tells you to do so. To quit smoking: Quit right away  Quit smoking totally, instead of slowly cutting back on how much you smoke over a period of time.  Go to counseling. You are more likely to quit if you go to counseling sessions regularly. Take medicine You may take medicines to help you quit. Some  medicines need a prescription, and some you can buy over-the-counter. Some medicines may contain a drug called nicotine to replace the nicotine in cigarettes. Medicines may:  Help you to stop having the desire to smoke (cravings).  Help to stop the problems that come when you stop smoking (withdrawal symptoms). Your doctor may ask you to use:  Nicotine patches, gum, or lozenges.  Nicotine inhalers or sprays.  Non-nicotine medicine that is taken by mouth. Find resources Find resources and other ways to help you quit smoking and remain smoke-free after you quit. These resources are most helpful when you use them often. They include:  Online chats with a counselor.  Phone quitlines.  Printed self-help materials.  Support groups or group counseling.  Text messaging programs.  Mobile phone apps. Use apps on your mobile phone or tablet that can help you stick to your quit plan. There are many free apps for mobile phones and tablets as well as websites. Examples include Quit Guide from the CDC and smokefree.gov  What things can I do to make it easier to quit?   Talk to your family and friends. Ask them to support and encourage you.  Call a phone quitline (1-800-QUIT-NOW), reach out to support groups, or work with a counselor.  Ask people who smoke to not smoke around you.  Avoid places that make you want to smoke,   such as: ? Bars. ? Parties. ? Smoke-break areas at work.  Spend time with people who do not smoke.  Lower the stress in your life. Stress can make you want to smoke. Try these things to help your stress: ? Getting regular exercise. ? Doing deep-breathing exercises. ? Doing yoga. ? Meditating. ? Doing a body scan. To do this, close your eyes, focus on one area of your body at a time from head to toe. Notice which parts of your body are tense. Try to relax the muscles in those areas. How will I feel when I quit smoking? Day 1 to 3 weeks Within the first 24 hours,  you may start to have some problems that come from quitting tobacco. These problems are very bad 2-3 days after you quit, but they do not often last for more than 2-3 weeks. You may get these symptoms:  Mood swings.  Feeling restless, nervous, angry, or annoyed.  Trouble concentrating.  Dizziness.  Strong desire for high-sugar foods and nicotine.  Weight gain.  Trouble pooping (constipation).  Feeling like you may vomit (nausea).  Coughing or a sore throat.  Changes in how the medicines that you take for other issues work in your body.  Depression.  Trouble sleeping (insomnia). Week 3 and afterward After the first 2-3 weeks of quitting, you may start to notice more positive results, such as:  Better sense of smell and taste.  Less coughing and sore throat.  Slower heart rate.  Lower blood pressure.  Clearer skin.  Better breathing.  Fewer sick days. Quitting smoking can be hard. Do not give up if you fail the first time. Some people need to try a few times before they succeed. Do your best to stick to your quit plan, and talk with your doctor if you have any questions or concerns. Summary  Smoking tobacco is the leading cause of preventable death. Quitting smoking can be hard, but it is one of the best things that you can do for your health.  When you decide to quit smoking, make a plan to help you succeed.  Quit smoking right away, not slowly over a period of time.  When you start quitting, seek help from your doctor, family, or friends. This information is not intended to replace advice given to you by your health care provider. Make sure you discuss any questions you have with your health care provider. Document Released: 10/12/2009 Document Revised: 03/05/2019 Document Reviewed: 03/06/2019 Elsevier Patient Education  2020 Elsevier Inc.  

## 2019-09-15 ENCOUNTER — Encounter: Payer: Self-pay | Admitting: Cardiology

## 2019-09-15 NOTE — Progress Notes (Signed)
Remote ICD transmission.   

## 2019-09-21 ENCOUNTER — Telehealth: Payer: Self-pay | Admitting: *Deleted

## 2019-09-21 NOTE — Telephone Encounter (Signed)
Patient called stating that she is scheduled to have a procedure on 09-23-2019 with Dr. Luna Glasgow. States that Dr.Keeling had sent over a request regarding patient coming off of her coumdin.  Patient needs to know what to do about going off of the coumdin.

## 2019-09-21 NOTE — Telephone Encounter (Signed)
I have not seen any clearance form from Dr Brooke Bonito office.  Is there anything on his desk?

## 2019-09-22 NOTE — Telephone Encounter (Signed)
Spoke with patient and [name] [name] at Dr Brooke Bonito office.  Pt is scheduled for CT arthrogram tomorrow 9/24.  Pt needs to be off coumadin coumadin 5 days before procedure and be bridged with Lovenox.  CT will have to be rescheduled.  Pt aware.  [name] [name] will discuss with Dr Luna Glasgow when returns 10/05/19.  [name] [name] will call me with appointment date 1 week before procedure so coumadin can be discontinued and pt bridged with Lovenox.

## 2019-09-23 ENCOUNTER — Telehealth: Payer: Self-pay

## 2019-09-23 ENCOUNTER — Ambulatory Visit (HOSPITAL_COMMUNITY): Admission: RE | Admit: 2019-09-23 | Payer: Medicare PPO | Source: Ambulatory Visit

## 2019-09-23 ENCOUNTER — Other Ambulatory Visit (HOSPITAL_COMMUNITY): Payer: Medicare PPO

## 2019-09-23 NOTE — Telephone Encounter (Signed)
Spoke with patient yesterday about her appointment at [facility] for the scan. I told her that I would speak with Lattie Haw (coumadin nurse) and get back with her. I didn't have the opportunity to call back yesterday, so I called this morning and left her a message to call me.

## 2019-10-05 ENCOUNTER — Ambulatory Visit (INDEPENDENT_AMBULATORY_CARE_PROVIDER_SITE_OTHER): Payer: Medicare PPO | Admitting: *Deleted

## 2019-10-05 ENCOUNTER — Telehealth: Payer: Self-pay

## 2019-10-05 ENCOUNTER — Ambulatory Visit: Payer: Medicare PPO | Admitting: Orthopaedic Surgery

## 2019-10-05 ENCOUNTER — Other Ambulatory Visit: Payer: Self-pay

## 2019-10-05 DIAGNOSIS — Z5181 Encounter for therapeutic drug level monitoring: Secondary | ICD-10-CM | POA: Diagnosis not present

## 2019-10-05 DIAGNOSIS — D6859 Other primary thrombophilia: Secondary | ICD-10-CM

## 2019-10-05 DIAGNOSIS — D6861 Antiphospholipid syndrome: Secondary | ICD-10-CM

## 2019-10-05 DIAGNOSIS — I63549 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: Secondary | ICD-10-CM | POA: Diagnosis not present

## 2019-10-05 LAB — POCT INR: INR: 2.8 (ref 2.0–3.0)

## 2019-10-05 NOTE — Telephone Encounter (Signed)
Patient left message on voicemail for me to call her. I returned her call and explained to her that she would need to reschedule her CT Arthrogram with [facility] and let me know when it is so we can get her back in to go over it with Dr. Luna Glasgow. I reminded her to let her coumadin nurse(Lisa Joneen Caraway) know. I also reminded her that she would need to stop her coumadin 5 to 7 days before the arthrogram. She stated she was aware of this and will keep in touch with me.

## 2019-10-05 NOTE — Patient Instructions (Signed)
Continue warfarin 1 tablet daily except 1/2 tablet on Mondays and Thursdays Has been diagnosed with Alpha Gal.   Keep greens consistant Recheck in 4 weeks 

## 2019-10-07 ENCOUNTER — Encounter: Payer: Self-pay | Admitting: Cardiology

## 2019-10-07 ENCOUNTER — Telehealth (INDEPENDENT_AMBULATORY_CARE_PROVIDER_SITE_OTHER): Payer: Medicare PPO | Admitting: Cardiology

## 2019-10-07 ENCOUNTER — Ambulatory Visit: Payer: Medicare PPO | Admitting: Orthopaedic Surgery

## 2019-10-07 VITALS — BP 123/74 | HR 66 | Ht 67.0 in | Wt 218.0 lb

## 2019-10-07 DIAGNOSIS — I255 Ischemic cardiomyopathy: Secondary | ICD-10-CM

## 2019-10-07 DIAGNOSIS — D6861 Antiphospholipid syndrome: Secondary | ICD-10-CM | POA: Diagnosis not present

## 2019-10-07 DIAGNOSIS — I25119 Atherosclerotic heart disease of native coronary artery with unspecified angina pectoris: Secondary | ICD-10-CM | POA: Diagnosis not present

## 2019-10-07 DIAGNOSIS — I6523 Occlusion and stenosis of bilateral carotid arteries: Secondary | ICD-10-CM | POA: Diagnosis not present

## 2019-10-07 NOTE — Patient Instructions (Addendum)
Medication Instructions:   Your physician recommends that you continue on your current medications as directed. Please refer to the Current Medication list given to you today.  Labwork:  NONE  Testing/Procedures: Your physician has requested that you have an echocardiogram  In 3 months just before your next visit. Echocardiography is a painless test that uses sound waves to create images of your heart. It provides your doctor with information about the size and shape of your heart and how well your heart's chambers and valves are working. This procedure takes approximately one hour. There are no restrictions for this procedure.  Follow-Up:  Your physician recommends that you schedule a follow-up appointment in: 3 months.  Any Other Special Instructions Will Be Listed Below (If Applicable).  If you need a refill on your cardiac medications before your next appointment, please call your pharmacy.

## 2019-10-07 NOTE — Progress Notes (Signed)
Virtual Visit via Telephone Note   This visit type was conducted due to national recommendations for restrictions regarding the COVID-19 Pandemic (e.g. social distancing) in an effort to limit this patient's exposure and mitigate transmission in our community.  Due to her co-morbid illnesses, this patient is at least at moderate risk for complications without adequate follow up.  This format is felt to be most appropriate for this patient at this time.  The patient did not have access to video technology/had technical difficulties with video requiring transitioning to audio format only (telephone).  All issues noted in this document were discussed and addressed.  No physical exam could be performed with this format.  Please refer to the patient's chart for her  consent to telehealth for [facility].   Date:  10/07/2019   ID:  [name] [name], DOB [birth year], MRN [id number]  Patient Location: Home Provider Location: Office  PCP:  Sinda Du, MD  Cardiologist:  Rozann Lesches, MD Electrophysiologist:  None   Evaluation Performed:  Follow-Up Visit  Chief Complaint:   Cardiac follow-up  History of Present Illness:    [name] [name] is a 58 y.o. female last assessed via telehealth encounter in July.  We spoke by phone today.  She states that she has tolerated the switch to [name] [name] made after her last visit.  She still has a mild sense of orthostasis intermittently, but no frank dizziness or syncope.  She does not report any angina or palpitations.  She continues on Coumadin with follow-up in anticoagulation clinic.  Recent INR was 2.8.  She does not report any bleeding problems.  She sees Dr. Lovena Le in the device clinic, [name] in place.  Recent interrogation showed normal device function with brief NSVT.  The patient does not have symptoms concerning for COVID-19 infection (fever, chills, cough, or new shortness of breath).    Past Medical History:  Diagnosis Date  .  Aneurysm of internal carotid artery   . Anticardiolipin antibody positive    Chronic Coumadin  . Chronic combined systolic (congestive) and diastolic (congestive) heart failure ([place])   . Cocaine abuse in remission ([name])   . Coronary atherosclerosis of native coronary artery    Previous invasive cardiac [name]  Occluded diagonal, Diffuse LAD disease, Occluded Cx, and non obstructive RCA.   . Essential hypertension, benign   . Headache   . History of pneumonia   . Ischemic cardiomyopathy    LVEF 30-35%  . Mixed hyperlipidemia   . PVD (peripheral vascular disease) ([name])   . Raynaud's phenomenon   . Stroke [name]) 2007   Total occlusion of the right internal carotid   Past Surgical History:  Procedure Laterality Date  . ABDOMINAL AORTAGRAM N/A [name]/27/2015   Procedure: ABDOMINAL Maxcine Ham;  Surgeon: [name] Hampshire, MD;  Location: [facility];  Service: Cardiovascular;  Laterality: N/A;  . APPENDECTOMY    . CARDIAC DEFIBRILLATOR PLACEMENT     [employer] [name]  . EP IMPLANTABLE DEVICE N/A 03/01/2016   Procedure: [name] Generator Changeout;  Surgeon: Evans Lance, MD;  Location: [facility] CV [facility];  Service: Cardiovascular;  Laterality: N/A;  . L1 corpectomy   12/10  . Laryngeal polyp excision       Current Meds  Medication Sig  . carvedilol (COREG) 12.[name] MG tablet TAKE 1 TABLET BY MOUTH TWICE DAILY WITH MEALS (Patient taking differently: Take 12.[name] mg by mouth daily. )  . clonazePAM (  KLONOPIN) 1 MG tablet Take 1 mg by mouth 4 (four) times daily as needed for anxiety.  . DULoxetine (CYMBALTA) 60 MG capsule Take 60 mg by mouth every morning.  Marland Kitchen ENTRESTO 24-26 MG TAKE 1 TABLET BY MOUTH TWICE DAILY - STOP LOSARTAN & IMDUR  . famotidine (PEPCID) 20 MG tablet Take 1 tablet (20 mg total) by mouth 2 (two) times daily. (Patient taking differently: Take 20 mg by mouth daily. )  . fluticasone (FLONASE) 50 MCG/ACT nasal spray Place 1 spray into both nostrils 2  (two) times daily.  . furosemide (LASIX) 40 MG tablet TAKE 1 TABLET BY MOUTH DAILY AS NEEDED FOR SWELLING.  . furosemide (LASIX) 80 MG tablet TAKE 1 TABLET BY MOUTH DAILY (takes 80 AND 40 MG)  . gabapentin (NEURONTIN) 300 MG capsule Take 300 mg by mouth 2 (two) times daily.  Marland Kitchen guaiFENesin (MUCINEX) 600 MG 12 hr tablet Take 2 tablets (1,200 mg total) by mouth 2 (two) times daily.  . Multiple Vitamins-Minerals (MULTIVITAMINS THER. W/MINERALS) TABS Take 1 tablet by mouth daily.    . nitroGLYCERIN (NITROSTAT) 0.4 MG SL tablet PLACE 1 TABLET UNDER THE TONGUE EVERY [name] MINUTES FOR 3 DOSES AS NEEDED CHEST PAIN  . oxyCODONE-acetaminophen (PERCOCET) 10-325 MG tablet Take 1 tablet by mouth 4 (four) times daily as needed.  . potassium chloride SA (K-DUR) 20 MEQ tablet TAKE 1 TABLET BY MOUTH EVERY DAY  . promethazine (PHENERGAN) 25 MG tablet Take 25 mg by mouth every 6 (six) hours as needed for nausea.  Marland Kitchen spironolactone (ALDACTONE) 25 MG tablet TAKE 1 TABLET BY MOUTH EVERY DAY  . warfarin (COUMADIN) 10 MG tablet Take 1 tablet daily except 1/2 tablet on Thursdays (Patient taking differently: Take [name]-10 mg by mouth See admin instructions. TAKES 5MG  ON TUESDAYS AND THURSDAYS AND TAKES 10MG  ON ALL OTHER DAYS)     Allergies:   Metoclopramide hcl, Penicillins, Metoprolol, and Statins   Social History   Tobacco Use  . Smoking status: Current Every Day Smoker    Packs/day: 1.00    Years: 43.00    Pack years: 43.00    Types: E-cigarettes    Start date: 3/[name]/1979    Last attempt to quit: 10/[name]/2014    Years since quitting: 6.0  . Smokeless tobacco: Never Used  . Tobacco comment: resumed cigarettes, stopped last week but continues to vape  Substance Use Topics  . Alcohol use: No    Alcohol/week: 0.0 standard drinks  . Drug use: No    Comment: Prior history of cocaine     Family Hx: The patient's family history includes Ankylosing spondylitis in her sister; Heart attack in her brother; Heart disease (age of  onset: 64) in her father; Stomach cancer (age of onset: 2) in her brother.  ROS:   Please see the history of present illness.    Chronic knee pain. All other systems reviewed and are negative.   Prior CV studies:   The following studies were reviewed today:  Echocardiogram 02/18/2019:  1. The left ventricle has severely reduced systolic function, with an ejection fraction of 20-25%. The cavity size was moderately dilated. Left ventricular diastolic Doppler parameters are consistent with impaired relaxation. No obvious LV mural  thrombus.  2. There is akinesis of the left ventricular inferior wall and inferolateral wall.  3. The right ventricle has normal systolic function. The cavity was normal. There is no increase in right ventricular wall thickness. Device wire present.  4. Left atrial size was mild-moderately dilated.  [name]. The aortic valve is tricuspid Moderate aortic annular calcification noted.  6. The mitral valve is degenerative. Mild thickening of the mitral valve leaflet. There is mild mitral annular calcification present. Mitral valve regurgitation is mild to moderate by color flow Doppler.  7. The aortic root is normal in size and structure.  8. The tricuspid valve is normal in structure.  Labs/Other Tests and Data Reviewed:    EKG:  An ECG dated 12/31/2018 was personally reviewed today and demonstrated:  Sinus rhythm with IVCD and PAC, old anteroseptal infarct and high lateral infarct pattern.  Recent Labs: 12/31/2018: B Natriuretic Peptide 523.0 01/01/2019: Magnesium 2.0 07/27/2019: ALT 16; BUN 13; Creatinine, Ser 0.77; Hemoglobin 13.7; Platelets 173; Potassium 4.1; Sodium 139   Recent Lipid Panel Lab Results  Component Value Date/Time   CHOL 147 06/19/2017 02:26 AM   TRIG 97 06/19/2017 02:26 AM   HDL 31 (L) 06/19/2017 02:26 AM   CHOLHDL 4.7 06/19/2017 02:26 AM   LDLCALC 97 06/19/2017 02:26 AM    Wt Readings from Last 3 Encounters:  10/07/19 218 lb (98.9 kg)  09/14/19  220 lb (99.8 kg)  07/30/19 220 lb (99.8 kg)     Objective:    Vital Signs:  BP 123/74   Pulse 66   Ht [name]\' 7"  (1.702 m)   Wt 218 lb (98.9 kg)   BMI 34.14 kg/m    Patient spoke in full sentences, not short of breath. No audible wheezing or coughing. Speech pattern normal.  ASSESSMENT & PLAN:    1.  Chronic systolic heart failure with ischemic cardiomyopathy and LVEF 20 to 25%.  She is tolerating Coreg, Entresto, Aldactone, and Lasix.  Weight is down a few pounds.  We will plan a follow-up echocardiogram prior to her next office encounter.  2.  Hypercoagulable state with anticardiolipin antibody.  She continues on Coumadin with follow-up in anticoagulation clinic, recent INR therapeutic.  3.  Bilateral carotid artery disease with known occlusion of the RICA.  4.  CAD with stable angina symptoms.  COVID-19 Education: The signs and symptoms of COVID-19 were discussed with the patient and how to seek care for testing (follow up with PCP or arrange E-visit).  The importance of social distancing was discussed today.  Time:   Today, I have spent 7 minutes with the patient with telehealth technology discussing the above problems.     Medication Adjustments/Labs and Tests Ordered: Current medicines are reviewed at length with the patient today.  Concerns regarding medicines are outlined above.   Tests Ordered: Orders Placed This Encounter  Procedures  . ECHOCARDIOGRAM COMPLETE    Medication Changes: No orders of the defined types were placed in this encounter.   Follow Up:  In Person 3 months in the [place] office.  Signed, [place], MD  10/07/2019 4:42 PM    [facility]

## 2019-10-08 ENCOUNTER — Telehealth: Payer: Self-pay | Admitting: Cardiology

## 2019-10-08 ENCOUNTER — Other Ambulatory Visit: Payer: Self-pay | Admitting: *Deleted

## 2019-10-08 MED ORDER — ENTRESTO 24-26 MG PO TABS: ORAL_TABLET | ORAL | 3 refills | Status: DC

## 2019-10-08 MED ORDER — ENTRESTO 24-26 MG PO TABS: ORAL_TABLET | ORAL | 0 refills | Status: DC

## 2019-10-12 ENCOUNTER — Telehealth: Payer: Self-pay | Admitting: Radiology

## 2019-10-12 NOTE — Telephone Encounter (Signed)
Patient is going to have to wait for the knee arthrogram, she will have to pay a portion of it out of pocket, and she will also have to use lovenox, and have to pay for it also. She will let us know when she wants to RS, for now it is cancelled, the authorization has expired.   To you FYI

## 2019-10-20 DIAGNOSIS — I509 Heart failure, unspecified: Secondary | ICD-10-CM | POA: Diagnosis not present

## 2019-10-20 DIAGNOSIS — K529 Noninfective gastroenteritis and colitis, unspecified: Secondary | ICD-10-CM | POA: Diagnosis not present

## 2019-10-20 DIAGNOSIS — F419 Anxiety disorder, unspecified: Secondary | ICD-10-CM | POA: Diagnosis not present

## 2019-10-20 DIAGNOSIS — I25119 Atherosclerotic heart disease of native coronary artery with unspecified angina pectoris: Secondary | ICD-10-CM | POA: Diagnosis not present

## 2019-10-25 NOTE — Telephone Encounter (Signed)
Error

## 2019-11-02 ENCOUNTER — Ambulatory Visit (INDEPENDENT_AMBULATORY_CARE_PROVIDER_SITE_OTHER): Payer: Medicare PPO | Admitting: *Deleted

## 2019-11-02 ENCOUNTER — Other Ambulatory Visit: Payer: Self-pay

## 2019-11-02 DIAGNOSIS — Z5181 Encounter for therapeutic drug level monitoring: Secondary | ICD-10-CM | POA: Diagnosis not present

## 2019-11-02 DIAGNOSIS — D6859 Other primary thrombophilia: Secondary | ICD-10-CM

## 2019-11-02 DIAGNOSIS — I63549 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: Secondary | ICD-10-CM

## 2019-11-02 LAB — POCT INR: INR: 1.6 — AB (ref 2.0–3.0)

## 2019-11-02 NOTE — Patient Instructions (Signed)
Take 1 1/2 tablets tonight and tomorrow night then resume 1 tablet daily except 1/2 tablet on Mondays and Thursdays Has been diagnosed with Alpha Gal.   Keep greens consistant Recheck in 3 weeks

## 2019-11-08 ENCOUNTER — Telehealth: Payer: Self-pay | Admitting: *Deleted

## 2019-11-08 MED ORDER — ROSUVASTATIN CALCIUM 10 MG PO TABS: 10.0000 mg | ORAL_TABLET | ORAL | 3 refills | Status: DC

## 2019-11-08 NOTE — Telephone Encounter (Signed)
Received fax from [facility] requesting crestor 10 mg twice weekly start. Provider approved and rx sent.

## 2019-11-09 ENCOUNTER — Ambulatory Visit: Payer: Medicare PPO | Admitting: Cardiology

## 2019-11-23 ENCOUNTER — Ambulatory Visit (INDEPENDENT_AMBULATORY_CARE_PROVIDER_SITE_OTHER): Payer: Medicare PPO | Admitting: *Deleted

## 2019-11-23 DIAGNOSIS — D6861 Antiphospholipid syndrome: Secondary | ICD-10-CM

## 2019-11-23 DIAGNOSIS — Z5181 Encounter for therapeutic drug level monitoring: Secondary | ICD-10-CM | POA: Diagnosis not present

## 2019-11-23 DIAGNOSIS — D6859 Other primary thrombophilia: Secondary | ICD-10-CM

## 2019-11-23 DIAGNOSIS — I63549 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: Secondary | ICD-10-CM | POA: Diagnosis not present

## 2019-11-23 LAB — POCT INR: INR: 3.3 — AB (ref 2.0–3.0)

## 2019-11-23 NOTE — Patient Instructions (Signed)
Take 1/2 tablet tonight then resume 1 tablet daily except 1/2 tablet on Mondays and Thursdays Has been diagnosed with Alpha Gal.   Keep greens consistant Recheck in 4 weeks

## 2019-12-03 ENCOUNTER — Ambulatory Visit (INDEPENDENT_AMBULATORY_CARE_PROVIDER_SITE_OTHER): Payer: Medicare PPO | Admitting: *Deleted

## 2019-12-03 DIAGNOSIS — I255 Ischemic cardiomyopathy: Secondary | ICD-10-CM

## 2019-12-06 LAB — CUP PACEART REMOTE DEVICE CHECK
Battery Remaining Longevity: 126 mo
Battery Remaining Percentage: 100 %
Brady Statistic RA Percent Paced: 0 %
Brady Statistic RV Percent Paced: 0 %
Date Time Interrogation Session: 20201204063000
HighPow Impedance: 53 Ohm
Implantable Lead Implant Date: 20081119
Implantable Lead Implant Date: 20081119
Implantable Lead Location: 753859
Implantable Lead Location: 753860
Implantable Lead Model: 185
Implantable Lead Model: 4136
Implantable Lead Serial Number: 211124
Implantable Lead Serial Number: 28383796
Implantable Pulse Generator Implant Date: 20170303
Lead Channel Impedance Value: 478 Ohm
Lead Channel Impedance Value: 530 Ohm
Lead Channel Pacing Threshold Amplitude: 0.5 V
Lead Channel Pacing Threshold Amplitude: 1.1 V
Lead Channel Pacing Threshold Pulse Width: 0.5 ms
Lead Channel Pacing Threshold Pulse Width: 0.5 ms
Lead Channel Setting Pacing Amplitude: 2 V
Lead Channel Setting Pacing Amplitude: 2.4 V
Lead Channel Setting Pacing Pulse Width: 0.5 ms
Lead Channel Setting Sensing Sensitivity: 0.6 mV
Pulse Gen Serial Number: 204398

## 2019-12-08 DIAGNOSIS — M170 Bilateral primary osteoarthritis of knee: Secondary | ICD-10-CM | POA: Diagnosis not present

## 2019-12-08 DIAGNOSIS — I509 Heart failure, unspecified: Secondary | ICD-10-CM | POA: Diagnosis not present

## 2019-12-08 DIAGNOSIS — I25119 Atherosclerotic heart disease of native coronary artery with unspecified angina pectoris: Secondary | ICD-10-CM | POA: Diagnosis not present

## 2019-12-08 DIAGNOSIS — F419 Anxiety disorder, unspecified: Secondary | ICD-10-CM | POA: Diagnosis not present

## 2019-12-09 DIAGNOSIS — R252 Cramp and spasm: Secondary | ICD-10-CM | POA: Diagnosis not present

## 2019-12-09 DIAGNOSIS — G894 Chronic pain syndrome: Secondary | ICD-10-CM | POA: Diagnosis not present

## 2019-12-09 DIAGNOSIS — M545 Low back pain: Secondary | ICD-10-CM | POA: Diagnosis not present

## 2019-12-09 DIAGNOSIS — G8929 Other chronic pain: Secondary | ICD-10-CM | POA: Diagnosis not present

## 2019-12-09 DIAGNOSIS — M25569 Pain in unspecified knee: Secondary | ICD-10-CM | POA: Diagnosis not present

## 2019-12-21 ENCOUNTER — Ambulatory Visit (INDEPENDENT_AMBULATORY_CARE_PROVIDER_SITE_OTHER): Payer: Medicare PPO | Admitting: *Deleted

## 2019-12-21 ENCOUNTER — Other Ambulatory Visit: Payer: Self-pay

## 2019-12-21 DIAGNOSIS — Z5181 Encounter for therapeutic drug level monitoring: Secondary | ICD-10-CM | POA: Diagnosis not present

## 2019-12-21 DIAGNOSIS — D6859 Other primary thrombophilia: Secondary | ICD-10-CM | POA: Diagnosis not present

## 2019-12-21 DIAGNOSIS — I63549 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: Secondary | ICD-10-CM

## 2019-12-21 DIAGNOSIS — D6861 Antiphospholipid syndrome: Secondary | ICD-10-CM | POA: Diagnosis not present

## 2019-12-21 LAB — POCT INR: INR: 2.2 (ref 2.0–3.0)

## 2019-12-21 NOTE — Patient Instructions (Signed)
Continue warfarin 1 tablet daily except 1/2 tablet on Mondays and Thursdays Has been diagnosed with Alpha Gal.   Keep greens consistant Recheck in 4 weeks 

## 2020-01-05 ENCOUNTER — Other Ambulatory Visit: Payer: Medicare PPO

## 2020-01-06 ENCOUNTER — Other Ambulatory Visit: Payer: Self-pay

## 2020-01-06 ENCOUNTER — Ambulatory Visit (INDEPENDENT_AMBULATORY_CARE_PROVIDER_SITE_OTHER): Payer: Medicare PPO

## 2020-01-06 DIAGNOSIS — I255 Ischemic cardiomyopathy: Secondary | ICD-10-CM

## 2020-01-07 ENCOUNTER — Telehealth: Payer: Self-pay | Admitting: *Deleted

## 2020-01-07 NOTE — Telephone Encounter (Signed)
Patient informed. Copy sent to PCP °

## 2020-01-07 NOTE — Telephone Encounter (Signed)
-----   Message from [name], MD sent at 01/07/2020  7:44 AM EST ----- Results reviewed.  In comparison to the previous study, LVEF has improved somewhat, now 25 to 30% range.  Still important to continue with current therapy and follow-up plan.

## 2020-01-10 ENCOUNTER — Ambulatory Visit: Payer: Medicare PPO | Admitting: Cardiology

## 2020-01-12 ENCOUNTER — Other Ambulatory Visit: Payer: Self-pay | Admitting: Cardiology

## 2020-01-13 DIAGNOSIS — G8929 Other chronic pain: Secondary | ICD-10-CM | POA: Diagnosis not present

## 2020-01-13 DIAGNOSIS — Z79891 Long term (current) use of opiate analgesic: Secondary | ICD-10-CM | POA: Diagnosis not present

## 2020-01-13 DIAGNOSIS — R252 Cramp and spasm: Secondary | ICD-10-CM | POA: Diagnosis not present

## 2020-01-13 DIAGNOSIS — M25569 Pain in unspecified knee: Secondary | ICD-10-CM | POA: Diagnosis not present

## 2020-01-16 ENCOUNTER — Encounter: Payer: Self-pay | Admitting: Cardiology

## 2020-01-16 NOTE — Progress Notes (Signed)
Virtual Visit via Telephone Note   This visit type was conducted due to national recommendations for restrictions regarding the COVID-19 Pandemic (e.g. social distancing) in an effort to limit this patient's exposure and mitigate transmission in our community.  Due to her co-morbid illnesses, this patient is at least at moderate risk for complications without adequate follow up.  This format is felt to be most appropriate for this patient at this time.  The patient did not have access to video technology/had technical difficulties with video requiring transitioning to audio format only (telephone).  All issues noted in this document were discussed and addressed.  No physical exam could be performed with this format.  Please refer to the patient's chart for her  consent to telehealth for [facility].   Date:  01/17/2020   ID:  [name] [name], DOB 1961/04/10, MRN [id number]  Patient Location: Home Provider Location: Office  PCP:  Glenda Chroman, MD  Cardiologist:  Rozann Lesches, MD Electrophysiologist:  None   Evaluation Performed:  Follow-Up Visit  Chief Complaint:  Cardiac follow-up  History of Present Illness:    [name] [name] is a 59 y.o. female last assessed via telehealth encounter in October 2020.  We spoke by phone today.  She states that she has been tolerating her medications, does not report any progressive shortness of breath, no palpitations or syncope.  No obvious angina.  She remains on Coumadin with follow-up in the anticoagulation clinic.  Last INR was 2.2.  She has had no spontaneous bleeding problems.  Recent follow-up echocardiogram showed LVEF 25 to 30% with severe chamber dilatation, normal RV contraction, moderate mitral regurgitation with severely dilated left atrium.  We discussed the results today.  She follows with Dr. Lovena Le in the device clinic, [place] ICD in place.  Device check in December 2020 showed normal function.  No device shocks.  Current  cardiac regimen includes Coreg, Lasix, potassium supplements, Entresto, Aldactone, and Coumadin.  She has statin intolerance.  The patient does not have symptoms concerning for COVID-19 infection (fever, chills, cough, or new shortness of breath).  She states that she wears a mask when she goes out.   Past Medical History:  Diagnosis Date  . Aneurysm of internal carotid artery   . Anticardiolipin antibody positive    Chronic Coumadin  . Chronic combined systolic (congestive) and diastolic (congestive) heart failure ([place])   . Cocaine abuse in remission ([place])   . Coronary atherosclerosis of native coronary artery    Previous invasive cardiac testing was done at a facility in [place], [place].  Occluded diagonal, Diffuse LAD disease, Occluded Cx, and non obstructive RCA.   . Essential hypertension   . Headache   . History of pneumonia   . Ischemic cardiomyopathy    LVEF 30-35%  . Mixed hyperlipidemia   . PVD (peripheral vascular disease) ([place])   . Raynaud's phenomenon   . Stroke [facility]) 2007   Total occlusion of the right internal carotid   Past Surgical History:  Procedure Laterality Date  . ABDOMINAL AORTAGRAM N/A 05/25/2014   Procedure: ABDOMINAL Maxcine Ham;  Surgeon: Wellington Hampshire, MD;  Location: [facility] CATH [facility];  Service: Cardiovascular;  Laterality: N/A;  . APPENDECTOMY    . CARDIAC DEFIBRILLATOR PLACEMENT     St[employer]Jude ICD  . EP IMPLANTABLE DEVICE N/A 03/01/2016   Procedure: ICD Generator Changeout;  Surgeon: Evans Lance, MD;  Location: [place] CV LAB;  Service: Cardiovascular;  Laterality: N/A;  . L1  corpectomy   12/10  . Laryngeal polyp excision       Current Meds  Medication Sig  . carvedilol (COREG) 12.5 MG tablet TAKE 1 TABLET BY MOUTH TWICE DAILY WITH MEALS (Patient taking differently: Take 12.5 mg by mouth daily. )  . DULoxetine (CYMBALTA) 60 MG capsule Take 60 mg by mouth every morning.  . famotidine (PEPCID) 20 MG tablet Take 1 tablet (20 mg total) by mouth 2  (two) times daily. (Patient taking differently: Take 20 mg by mouth daily. )  . fluticasone (FLONASE) 50 MCG/ACT nasal spray Place 1 spray into both nostrils 2 (two) times daily.  . furosemide (LASIX) 40 MG tablet TAKE 1 TABLET BY MOUTH DAILY AS NEEDED FOR SWELLING.  . furosemide (LASIX) 80 MG tablet TAKE 1 TABLET BY MOUTH DAILY (takes 80 AND 40 MG)  . gabapentin (NEURONTIN) 300 MG capsule Take 300 mg by mouth 2 (two) times daily.  Marland Kitchen guaiFENesin (MUCINEX) 600 MG 12 hr tablet Take 2 tablets (1,200 mg total) by mouth 2 (two) times daily.  . methocarbamol (ROBAXIN) 750 MG tablet Take 750 mg by mouth every 8 (eight) hours as needed for muscle spasms.  . Multiple Vitamins-Minerals (MULTIVITAMINS THER. W/MINERALS) TABS Take 1 tablet by mouth daily.    . nitroGLYCERIN (NITROSTAT) 0.4 MG SL tablet PLACE 1 TABLET UNDER THE TONGUE EVERY 5 MINUTES FOR 3 DOSES AS NEEDED CHEST PAIN  . oxyCODONE-acetaminophen (PERCOCET) 10-325 MG tablet Take 1 tablet by mouth 4 (four) times daily as needed.  . potassium chloride SA (KLOR-CON) 20 MEQ tablet TAKE 1 TABLET BY MOUTH EVERY DAY  . promethazine (PHENERGAN) 25 MG tablet Take 25 mg by mouth every 6 (six) hours as needed for nausea.  . sacubitril-valsartan (ENTRESTO) 24-26 MG TAKE 1 TABLET BY MOUTH TWICE DAILY - STOP LOSARTAN & IMDUR  . spironolactone (ALDACTONE) 25 MG tablet TAKE 1 TABLET BY MOUTH EVERY DAY  . warfarin (COUMADIN) 10 MG tablet Take 1 tablet daily except 1/2 tablet on Thursdays (Patient taking differently: Take 5-10 mg by mouth See admin instructions. TAKES 5MG  ON TUESDAYS AND THURSDAYS AND TAKES 10MG  ON ALL OTHER DAYS)  . [DISCONTINUED] clonazePAM (KLONOPIN) 1 MG tablet Take 1 mg by mouth 4 (four) times daily as needed for anxiety.  . [DISCONTINUED] rosuvastatin (CRESTOR) 10 MG tablet Take 1 tablet (10 mg total) by mouth 2 (two) times a week.     Allergies:   Metoclopramide hcl, Penicillins, Metoprolol, and Statins   Social History   Tobacco Use  .  Smoking status: Current Every Day Smoker    Packs/day: 1.00    Years: 43.00    Pack years: 43.00    Types: E-cigarettes    Start date: 03/03/1978    Last attempt to quit: 10/03/2013    Years since quitting: 6.2  . Smokeless tobacco: Never Used  . Tobacco comment: resumed cigarettes, stopped last week but continues to vape  Substance Use Topics  . Alcohol use: No    Alcohol/week: 0.0 standard drinks  . Drug use: No    Comment: Prior history of cocaine     Family Hx: The patient's family history includes Ankylosing spondylitis in her sister; Heart attack in her brother; Heart disease (age of onset: 49) in her father; Stomach cancer (age of onset: 2) in her brother.  ROS:   Please see the history of present illness. All other systems reviewed and are negative.   Prior CV studies:   The following studies were reviewed today:  Echocardiogram 01/06/2020:  1. Left ventricular ejection fraction, by visual estimation, is 25 to 30%. The left ventricle has severely decreased function. There is no left ventricular hypertrophy.  2. Elevated left atrial pressure.  3. Left ventricular diastolic parameters are consistent with Grade I diastolic dysfunction (impaired relaxation).  4. Severely dilated left ventricular internal cavity size.  5. The left ventricle demonstrates global hypokinesis.  6. Global right ventricle has normal systolic function.The right ventricular size is normal. No increase in right ventricular wall thickness.  7. Left atrial size was severely dilated.  8. Right atrial size was normal.  9. The mitral valve is normal in structure. Moderate mitral valve regurgitation. No evidence of mitral stenosis. 10. The tricuspid valve is normal in structure. 11. The aortic valve has an indeterminant number of cusps. Aortic valve regurgitation is not visualized. No evidence of aortic valve sclerosis or stenosis. 12. The pulmonic valve was not well visualized. Pulmonic valve regurgitation  is not visualized. 13. Normal pulmonary artery systolic pressure. 14. A pacer wire is visualized. 15. The inferior vena cava is normal in size with greater than 50% respiratory variability, suggesting right atrial pressure of 3 mmHg.  Labs/Other Tests and Data Reviewed:    EKG:  An ECG dated 12/31/2018 was personally reviewed today and demonstrated:  Sinus rhythm with IVCD and PAC, old anteroseptal infarct and high lateral infarct pattern.  Recent Labs: 07/27/2019: ALT 16; BUN 13; Creatinine, Ser 0.77; Hemoglobin 13.7; Platelets 173; Potassium 4.1; Sodium 139    Wt Readings from Last 3 Encounters:  01/17/20 221 lb (100.2 kg)  10/07/19 218 lb (98.9 kg)  09/14/19 220 lb (99.8 kg)     Objective:    Vital Signs:  BP 124/78   Pulse 69   Ht 5' 6.5" (1.689 m)   Wt 221 lb (100.2 kg)   BMI 35.14 kg/m    Patient spoke in full sentences, not short of breath. No audible wheezing or coughing.  ASSESSMENT & PLAN:    1.  Chronic systolic heart failure with ischemic cardiomyopathy.  LVEF 25 to 30% range by most recent echocardiogram.  She is clinically stable, admits that she has gained weight with less activity during the pandemic, but does not report any swelling or orthopnea.  Continue current regimen including Coreg, Entresto, Aldactone, and Lasix with potassium supplements.  We will get a BMET for next office visit.  2.  Hypercoagulable state with anticardiolipin antibody.  She is on long-term Coumadin with follow-up in the anticoagulation clinic, most recent INR therapeutic.  No reported bleeding problems.  3.  Bilateral carotid artery disease with occluded RICA.  She is asymptomatic.  4.  CAD without active angina symptoms.  She is not on aspirin given use of Coumadin.  She has statin intolerance.  5.  St. Jude ICD in place, no device shocks or syncope.  Keep follow-up with Dr. Johney Frame.  6.  Health maintenance.  She is transitioning care to Dr. Sherril Croon following Dr. [name]' retirement.   She will see Dr. Gerilyn Pilgrim for pain management.  COVID-19 Education: The signs and symptoms of COVID-19 were discussed with the patient and how to seek care for testing (follow up with PCP or arrange E-visit).  The importance of social distancing was discussed today.  Time:   Today, I have spent 8 minutes with the patient with telehealth technology discussing the above problems.     Medication Adjustments/Labs and Tests Ordered: Current medicines are reviewed at length with the patient today.  Concerns  regarding medicines are outlined above.   Tests Ordered: Orders Placed This Encounter  Procedures  . Basic Metabolic Panel (BMET)    Medication Changes: No orders of the defined types were placed in this encounter.   Follow Up:  In Person 4 months in the [place] office.  Signed, Nona Dell, MD  01/17/2020 2:05 PM    [facility]

## 2020-01-17 ENCOUNTER — Telehealth (INDEPENDENT_AMBULATORY_CARE_PROVIDER_SITE_OTHER): Payer: Medicare PPO | Admitting: Cardiology

## 2020-01-17 ENCOUNTER — Encounter: Payer: Self-pay | Admitting: Cardiology

## 2020-01-17 VITALS — BP 124/78 | HR 69 | Ht 66.5 in | Wt 221.0 lb

## 2020-01-17 DIAGNOSIS — I6523 Occlusion and stenosis of bilateral carotid arteries: Secondary | ICD-10-CM | POA: Diagnosis not present

## 2020-01-17 DIAGNOSIS — Z95810 Presence of automatic (implantable) cardiac defibrillator: Secondary | ICD-10-CM | POA: Diagnosis not present

## 2020-01-17 DIAGNOSIS — I255 Ischemic cardiomyopathy: Secondary | ICD-10-CM | POA: Diagnosis not present

## 2020-01-17 DIAGNOSIS — D6861 Antiphospholipid syndrome: Secondary | ICD-10-CM | POA: Diagnosis not present

## 2020-01-17 DIAGNOSIS — I5022 Chronic systolic (congestive) heart failure: Secondary | ICD-10-CM | POA: Diagnosis not present

## 2020-01-17 DIAGNOSIS — D6859 Other primary thrombophilia: Secondary | ICD-10-CM

## 2020-01-17 NOTE — Patient Instructions (Addendum)
Your physician wants you to follow-up in: 4 MONTHS WITH DR MCDOWELL  You will receive a reminder letter in the mail two months in advance. If you don't receive a letter, please call our office to schedule the follow-up appointment.  Your physician recommends that you continue on your current medications as directed. Please refer to the Current Medication list given to you today.  Your physician recommends that you return for lab work BMP - JUST PRIOR TO YOUR NEXT APPOINTMENT   Thank you for choosing [facility]!!

## 2020-01-18 ENCOUNTER — Ambulatory Visit (INDEPENDENT_AMBULATORY_CARE_PROVIDER_SITE_OTHER): Payer: Medicare PPO | Admitting: *Deleted

## 2020-01-18 DIAGNOSIS — D6859 Other primary thrombophilia: Secondary | ICD-10-CM | POA: Diagnosis not present

## 2020-01-18 DIAGNOSIS — I63549 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: Secondary | ICD-10-CM | POA: Diagnosis not present

## 2020-01-18 DIAGNOSIS — Z5181 Encounter for therapeutic drug level monitoring: Secondary | ICD-10-CM | POA: Diagnosis not present

## 2020-01-18 LAB — POCT INR: INR: 2.8 (ref 2.0–3.0)

## 2020-01-18 NOTE — Patient Instructions (Signed)
Continue warfarin 1 tablet daily except 1/2 tablet on Mondays and Thursdays Has been diagnosed with Alpha Gal.   Keep greens consistant Recheck in 4 weeks

## 2020-02-10 ENCOUNTER — Telehealth: Payer: Self-pay | Admitting: *Deleted

## 2020-02-10 MED ORDER — WARFARIN SODIUM 10 MG PO TABS: ORAL_TABLET | ORAL | 3 refills | Status: DC

## 2020-02-10 NOTE — Telephone Encounter (Signed)
     1. Which medications need to be refilled? (please list name of each medication and dose if known) warfarin (COUMADIN) 10 MG tablet    2. Which pharmacy/location (including street and city if local pharmacy) is medication to be sent to?  [facility]  3. Do they need a 30 day or 90 day supply?  Patient states that she is almost of medication

## 2020-02-14 ENCOUNTER — Other Ambulatory Visit: Payer: Self-pay | Admitting: Cardiology

## 2020-02-15 ENCOUNTER — Ambulatory Visit (INDEPENDENT_AMBULATORY_CARE_PROVIDER_SITE_OTHER): Payer: Medicare PPO | Admitting: *Deleted

## 2020-02-15 ENCOUNTER — Other Ambulatory Visit: Payer: Self-pay

## 2020-02-15 DIAGNOSIS — Z5181 Encounter for therapeutic drug level monitoring: Secondary | ICD-10-CM | POA: Diagnosis not present

## 2020-02-15 DIAGNOSIS — D6859 Other primary thrombophilia: Secondary | ICD-10-CM | POA: Diagnosis not present

## 2020-02-15 DIAGNOSIS — I63549 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: Secondary | ICD-10-CM

## 2020-02-15 LAB — POCT INR: INR: 2.2 (ref 2.0–3.0)

## 2020-02-15 NOTE — Patient Instructions (Signed)
Continue warfarin 1 tablet daily except 1/2 tablet on Mondays and Thursdays Has been diagnosed with Alpha Gal.   Keep greens consistant Recheck in 6 weeks

## 2020-03-03 ENCOUNTER — Ambulatory Visit (INDEPENDENT_AMBULATORY_CARE_PROVIDER_SITE_OTHER): Payer: Medicare PPO | Admitting: *Deleted

## 2020-03-03 DIAGNOSIS — I255 Ischemic cardiomyopathy: Secondary | ICD-10-CM

## 2020-03-06 LAB — CUP PACEART REMOTE DEVICE CHECK
Battery Remaining Longevity: 126 mo
Battery Remaining Percentage: 100 %
Brady Statistic RA Percent Paced: 0 %
Brady Statistic RV Percent Paced: 0 %
Date Time Interrogation Session: 20210305184000
HighPow Impedance: 52 Ohm
Implantable Lead Implant Date: 20081119
Implantable Lead Implant Date: 20081119
Implantable Lead Location: 753859
Implantable Lead Location: 753860
Implantable Lead Model: 185
Implantable Lead Model: 4136
Implantable Lead Serial Number: 211124
Implantable Lead Serial Number: 28383796
Implantable Pulse Generator Implant Date: 20170303
Lead Channel Impedance Value: 470 Ohm
Lead Channel Impedance Value: 521 Ohm
Lead Channel Pacing Threshold Amplitude: 0.5 V
Lead Channel Pacing Threshold Amplitude: 1.1 V
Lead Channel Pacing Threshold Pulse Width: 0.5 ms
Lead Channel Pacing Threshold Pulse Width: 0.5 ms
Lead Channel Setting Pacing Amplitude: 2 V
Lead Channel Setting Pacing Amplitude: 2.4 V
Lead Channel Setting Pacing Pulse Width: 0.5 ms
Lead Channel Setting Sensing Sensitivity: 0.6 mV
Pulse Gen Serial Number: 204398

## 2020-03-06 NOTE — Progress Notes (Signed)
ICD Remote  

## 2020-03-16 ENCOUNTER — Telehealth: Payer: Self-pay | Admitting: Cardiology

## 2020-03-16 DIAGNOSIS — I739 Peripheral vascular disease, unspecified: Secondary | ICD-10-CM

## 2020-03-16 DIAGNOSIS — M79605 Pain in left leg: Secondary | ICD-10-CM

## 2020-03-16 DIAGNOSIS — M79604 Pain in right leg: Secondary | ICD-10-CM

## 2020-03-16 NOTE — Telephone Encounter (Signed)
Reports bilateral leg pain for the past 2 weeks.Reports having excruciating leg pain last night. Reports pain in legs is worse when she lays down. Denies redness or swelling in legs. Denies SOB. Reports having h/o PAD. Reports seeing Dr. Kirke Corin in the past and is unsure of why she hasn't followed up with him. Patient is requesting dopplers on her legs.

## 2020-03-16 NOTE — Telephone Encounter (Signed)
Patient  is having terrible pains in her legs -. She states she has SIRS and is concerned she may be gettting ready to throw a clot.Patient was wanting to know if test need to be ordered or does she need to come into office.

## 2020-03-16 NOTE — Telephone Encounter (Signed)
Patient informed and says the pain aches at bedtime but doesn't hurt during the day. Patient is aware that she needed to go to the ED for an evaluation if he symptoms worsen. Patient request to be scheduled for LE arterial doppler. Advised she would be contacted with an appointment. Verbalized understanding of plan.

## 2020-03-16 NOTE — Telephone Encounter (Signed)
If she is having continued, excruciating pain in her legs, the best course of action would be evaluation in the ER to make sure that there is not an acute process.  She is on anticoagulation making the likelihood of thrombosis lower however.  If the symptoms are settling down and not progressive, we could arrange lower extremity arterial Dopplers and ABIs as an outpatient.

## 2020-03-21 NOTE — Addendum Note (Signed)
Addended by: Eustace Moore on: 03/21/2020 10:13 AM   Modules accepted: Orders

## 2020-03-21 NOTE — Telephone Encounter (Signed)
Per Vicky-"Can you please change her order for the LE testing *scheduled tomorrow here: to be done at [facility]".  New order placed for LE to be done at [facility].

## 2020-03-23 ENCOUNTER — Telehealth: Payer: Self-pay | Admitting: *Deleted

## 2020-03-23 ENCOUNTER — Other Ambulatory Visit: Payer: Self-pay

## 2020-03-23 ENCOUNTER — Ambulatory Visit (HOSPITAL_COMMUNITY)
Admission: RE | Admit: 2020-03-23 | Discharge: 2020-03-23 | Disposition: A | Discharge status: Home / Self Care | Payer: Medicare PPO | Source: Ambulatory Visit | Attending: Cardiology | Admitting: Cardiology

## 2020-03-23 DIAGNOSIS — I739 Peripheral vascular disease, unspecified: Secondary | ICD-10-CM | POA: Diagnosis present

## 2020-03-23 DIAGNOSIS — M79605 Pain in left leg: Secondary | ICD-10-CM | POA: Insufficient documentation

## 2020-03-23 DIAGNOSIS — M79604 Pain in right leg: Secondary | ICD-10-CM | POA: Insufficient documentation

## 2020-03-23 NOTE — Telephone Encounter (Signed)
Patient informed. Copy sent to PCP °

## 2020-03-23 NOTE — Telephone Encounter (Signed)
-----   Message from [name], MD sent at 03/23/2020 11:18 AM EDT ----- Results reviewed.  Lower extremity arterial studies are normal on the right, mildly reduced on the left.  Not clear that this would necessarily be associated with symptoms.  If she is having predominantly left-sided leg pain, we could always refer her to one of our vascular specialist for further investigation.

## 2020-03-28 ENCOUNTER — Ambulatory Visit (INDEPENDENT_AMBULATORY_CARE_PROVIDER_SITE_OTHER): Payer: Medicare PPO | Admitting: *Deleted

## 2020-03-28 ENCOUNTER — Other Ambulatory Visit: Payer: Self-pay

## 2020-03-28 DIAGNOSIS — Z5181 Encounter for therapeutic drug level monitoring: Secondary | ICD-10-CM

## 2020-03-28 DIAGNOSIS — I63549 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: Secondary | ICD-10-CM | POA: Diagnosis not present

## 2020-03-28 DIAGNOSIS — D6861 Antiphospholipid syndrome: Secondary | ICD-10-CM

## 2020-03-28 DIAGNOSIS — D6859 Other primary thrombophilia: Secondary | ICD-10-CM

## 2020-03-28 LAB — POCT INR: INR: 4.4 — AB (ref 2.0–3.0)

## 2020-03-28 NOTE — Patient Instructions (Signed)
Took Abx 2 wks ago.  On Prednisone taper. 3/29 - 4/2 Hold warfarin tonight, take 1 tablet tomorrow then resume 1 tablet daily except 1/2 tablet on Mondays and Thursdays Has been diagnosed with Alpha Gal.   Keep greens consistant Recheck in 1 week

## 2020-04-04 ENCOUNTER — Ambulatory Visit (INDEPENDENT_AMBULATORY_CARE_PROVIDER_SITE_OTHER): Payer: Medicare PPO | Admitting: Internal Medicine

## 2020-04-04 ENCOUNTER — Encounter: Payer: Self-pay | Admitting: Internal Medicine

## 2020-04-04 ENCOUNTER — Other Ambulatory Visit: Payer: Self-pay

## 2020-04-04 VITALS — BP 118/68 | HR 68 | Temp 98.2°F | Ht 66.0 in | Wt 226.0 lb

## 2020-04-04 DIAGNOSIS — I5022 Chronic systolic (congestive) heart failure: Secondary | ICD-10-CM | POA: Diagnosis not present

## 2020-04-04 DIAGNOSIS — Z95810 Presence of automatic (implantable) cardiac defibrillator: Secondary | ICD-10-CM | POA: Diagnosis not present

## 2020-04-04 LAB — CUP PACEART INCLINIC DEVICE CHECK
Brady Statistic AP VP Percent: 1 % — CL
Brady Statistic AP VS Percent: 1 % — CL
Date Time Interrogation Session: 20210406130340
Implantable Lead Implant Date: 20081119
Implantable Lead Implant Date: 20081119
Implantable Lead Location: 753859
Implantable Lead Location: 753860
Implantable Lead Model: 185
Implantable Lead Model: 4136
Implantable Lead Serial Number: 211124
Implantable Lead Serial Number: 28383796
Implantable Pulse Generator Implant Date: 20170303
Lead Channel Pacing Threshold Amplitude: 0.5 V
Lead Channel Pacing Threshold Amplitude: 1 V
Lead Channel Pacing Threshold Pulse Width: 0.5 ms
Lead Channel Pacing Threshold Pulse Width: 0.5 ms
Lead Channel Sensing Intrinsic Amplitude: 25 mV
Lead Channel Sensing Intrinsic Amplitude: 5 mV
Pulse Gen Serial Number: 204398

## 2020-04-04 NOTE — Progress Notes (Signed)
HPI [name] [name] returns today for followup. She is a very pleasant 59 year old woman with an ischemic cardiomyopathy, chronic systolic heart failure, well compensated, and VT, status post ICD implantation. The patient has done well in the interim. She has had no ICD therapies. She denies peripheral edema. She has chronic anterior lower leg pain. She admits to dietary indiscretion.  Allergies  Allergen Reactions  . Metoclopramide Hcl Anaphylaxis    Non responsive  . Penicillins Anaphylaxis    Pt states she can take any cephalosporin (per pt. 01/01/19) Shock Has patient had a PCN reaction causing immediate rash, facial/tongue/throat swelling, SOB or lightheadedness with hypotension: Yes Has patient had a PCN reaction causing severe rash involving mucus membranes or skin necrosis: No Has patient had a PCN reaction that required hospitalization Yes Has patient had a PCN reaction occurring within the last 10 years: No If all of the above answers are "NO", then may proceed with Cephalosporin   . Metoprolol Other (See Comments)    Syncope   . Statins Other (See Comments)    Myalgias     Current Outpatient Medications  Medication Sig Dispense Refill  . carvedilol (COREG) 12.5 MG tablet TAKE 1 TABLET BY MOUTH TWICE DAILY WITH MEALS 180 tablet 0  . DULoxetine (CYMBALTA) 60 MG capsule Take 60 mg by mouth every morning.    . famotidine (PEPCID) 20 MG tablet Take 1 tablet (20 mg total) by mouth 2 (two) times daily. (Patient taking differently: Take 20 mg by mouth daily. ) 60 tablet 1  . fluticasone (FLONASE) 50 MCG/ACT nasal spray Place 1 spray into both nostrils 2 (two) times daily.    . furosemide (LASIX) 40 MG tablet TAKE 1 TABLET BY MOUTH DAILY AS NEEDED FOR SWELLING. 30 tablet 6  . furosemide (LASIX) 80 MG tablet TAKE 1 TABLET BY MOUTH DAILY (takes 80 AND 40 MG) 90 tablet 1  . gabapentin (NEURONTIN) 300 MG capsule Take 300 mg by mouth 2 (two) times daily.    Marland Kitchen guaiFENesin (MUCINEX) 600  MG 12 hr tablet Take 2 tablets (1,200 mg total) by mouth 2 (two) times daily. 40 tablet 1  . methocarbamol (ROBAXIN) 750 MG tablet Take 750 mg by mouth every 8 (eight) hours as needed for muscle spasms.    . Multiple Vitamins-Minerals (MULTIVITAMINS THER. W/MINERALS) TABS Take 1 tablet by mouth daily.      . nitroGLYCERIN (NITROSTAT) 0.4 MG SL tablet PLACE 1 TABLET UNDER THE TONGUE EVERY 5 MINUTES FOR 3 DOSES AS NEEDED CHEST PAIN 25 tablet 3  . oxyCODONE-acetaminophen (PERCOCET) 10-325 MG tablet Take 1 tablet by mouth 4 (four) times daily as needed.    . potassium chloride SA (KLOR-CON) 20 MEQ tablet TAKE 1 TABLET BY MOUTH EVERY DAY 90 tablet 1  . promethazine (PHENERGAN) 25 MG tablet Take 25 mg by mouth every 6 (six) hours as needed for nausea.    . sacubitril-valsartan (ENTRESTO) 24-26 MG TAKE 1 TABLET BY MOUTH TWICE DAILY - STOP LOSARTAN & IMDUR 28 tablet 0  . spironolactone (ALDACTONE) 25 MG tablet TAKE 1 TABLET BY MOUTH EVERY DAY 90 tablet 1  . warfarin (COUMADIN) 10 MG tablet Take 1 tablet daily except 1/2 tablet on Mondays and Thursdays 30 tablet 3   No current facility-administered medications for this visit.     Past Medical History:  Diagnosis Date  . Aneurysm of internal carotid artery   . Anticardiolipin antibody positive    Chronic Coumadin  . Chronic combined  systolic (congestive) and diastolic (congestive) heart failure (HCC)   . Cocaine abuse in remission (HCC)   . Coronary atherosclerosis of native coronary artery    Previous invasive cardiac testing was done at a facility in [place], [place].  Occluded diagonal, Diffuse LAD disease, Occluded Cx, and non obstructive RCA.   . Essential hypertension   . Headache   . History of pneumonia   . Ischemic cardiomyopathy    LVEF 30-35%  . Mixed hyperlipidemia   . PVD (peripheral vascular disease) (HCC)   . Raynaud's phenomenon   . Stroke [facility]) 2007   Total occlusion of the right internal carotid    ROS:   All systems  reviewed and negative except as noted in the HPI.   Past Surgical History:  Procedure Laterality Date  . ABDOMINAL AORTAGRAM N/A 05/25/2014   Procedure: ABDOMINAL Ronny Flurry;  Surgeon: Iran Ouch, MD;  Location: [facility] CATH LAB;  Service: Cardiovascular;  Laterality: N/A;  . APPENDECTOMY    . CARDIAC DEFIBRILLATOR PLACEMENT     St.Jude ICD  . EP IMPLANTABLE DEVICE N/A 03/01/2016   Procedure: ICD Generator Changeout;  Surgeon: [name], MD;  Location: [facility] INVASIVE CV LAB;  Service: Cardiovascular;  Laterality: N/A;  . L1 corpectomy   12/10  . Laryngeal polyp excision       Family History  Problem Relation Age of Onset  . Heart disease Father 96  . Ankylosing spondylitis Sister   . Stomach cancer Brother 41  . Heart attack Brother      Social History   Socioeconomic History  . Marital status: Single    Spouse name: Not on file  . Number of children: Not on file  . Years of education: Not on file  . Highest education level: Not on file  Occupational History  . Occupation: Disabled    Comment: ESL  Tobacco Use  . Smoking status: Current Every Day Smoker    Packs/day: 1.00    Years: 43.00    Pack years: 43.00    Types: E-cigarettes    Start date: 03/03/1978    Last attempt to quit: 10/03/2013    Years since quitting: 6.5  . Smokeless tobacco: Never Used  . Tobacco comment: resumed cigarettes, stopped last week but continues to vape  Substance and Sexual Activity  . Alcohol use: No    Alcohol/week: 0.0 standard drinks  . Drug use: No    Comment: Prior history of cocaine  . Sexual activity: Yes    Birth control/protection: None  Other Topics Concern  . Not on file  Social History Narrative  . Not on file   Social Determinants of Health   Financial Resource Strain:   . Difficulty of Paying Living Expenses:   Food Insecurity:   . Worried About Programme researcher, broadcasting/film/video in the Last Year:   . Barista in the Last Year:   Transportation Needs:   . Automotive engineer (Medical):   Marland Kitchen Lack of Transportation (Non-Medical):   Physical Activity:   . Days of Exercise per Week:   . Minutes of Exercise per Session:   Stress:   . Feeling of Stress :   Social Connections:   . Frequency of Communication with Friends and Family:   . Frequency of Social Gatherings with Friends and Family:   . Attends Religious Services:   . Active Member of Clubs or Organizations:   . Attends Banker Meetings:   Marland Kitchen Marital  Status:   Intimate Partner Violence:   . Fear of Current or Ex-Partner:   . Emotionally Abused:   Marland Kitchen Physically Abused:   . Sexually Abused:      BP 118/68   Pulse 68   Temp 98.2 F (36.8 C)   Ht 5\' 6"  (1.676 m)   Wt 226 lb (102.5 kg)   SpO2 99%   BMI 36.48 kg/m   Physical Exam:  Well appearing NAD HEENT: Unremarkable Neck:  No JVD, no thyromegally Lymphatics:  No adenopathy Back:  No CVA tenderness Lungs:  Clear with no wheezes HEART:  Regular rate rhythm, no murmurs, no rubs, no clicks Abd:  soft, positive bowel sounds, no organomegally, no rebound, no guarding Ext:  2 plus pulses, no edema, no cyanosis, no clubbing Skin:  No rashes no nodules Neuro:  CN II through XII intact, motor grossly intact  DEVICE  Normal device function.  See PaceArt for details.   Assess/Plan: 1. VT - she has had no symptomatic VT. We will follow. 2. ICD - her [place]ona ICD is working normally. We cannot find any recent ICD therapies or sustained VT on interrogation. 3. CAD - she remains active and denies any anginal symptoms.  4. Obesity - she has gained weigth in the interim and I have encouraged her to start exercising and eat less and to lose weight.  Mikle Bosworth.D.

## 2020-04-04 NOTE — Patient Instructions (Signed)
Medication Instructions:  Your physician recommends that you continue on your current medications as directed. Please refer to the Current Medication list given to you today.  *If you need a refill on your cardiac medications before your next appointment, please call your pharmacy*   Lab Work: NONE   If you have labs (blood work) drawn today and your tests are completely normal, you will receive your results only by: . MyChart Message (if you have MyChart) OR . A paper copy in the mail If you have any lab test that is abnormal or we need to change your treatment, we will call you to review the results.   Testing/Procedures: NONE    Follow-Up: At [facility], you and your health needs are our priority.  As part of our continuing mission to provide you with exceptional heart care, we have created designated Provider Care Teams.  These Care Teams include your primary Cardiologist (physician) and Advanced Practice Providers (APPs -  Physician Assistants and Nurse Practitioners) who all work together to provide you with the care you need, when you need it.  We recommend signing up for the patient portal called "MyChart".  Sign up information is provided on this After Visit Summary.  MyChart is used to connect with patients for Virtual Visits (Telemedicine).  Patients are able to view lab/test results, encounter notes, upcoming appointments, etc.  Non-urgent messages can be sent to your provider as well.   To learn more about what you can do with MyChart, go to [email or web address].    Your next appointment:   1 year(s)  The format for your next appointment:   In Person  Provider:   Gregg Taylor, MD   Other Instructions Thank you for choosing [facility]!    

## 2020-04-06 ENCOUNTER — Other Ambulatory Visit: Payer: Self-pay

## 2020-04-06 ENCOUNTER — Ambulatory Visit (INDEPENDENT_AMBULATORY_CARE_PROVIDER_SITE_OTHER): Payer: Medicare PPO | Admitting: *Deleted

## 2020-04-06 DIAGNOSIS — Z5181 Encounter for therapeutic drug level monitoring: Secondary | ICD-10-CM

## 2020-04-06 DIAGNOSIS — I63549 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: Secondary | ICD-10-CM

## 2020-04-06 DIAGNOSIS — G8912 Acute post-thoracotomy pain: Secondary | ICD-10-CM | POA: Insufficient documentation

## 2020-04-06 DIAGNOSIS — D6859 Other primary thrombophilia: Secondary | ICD-10-CM | POA: Diagnosis not present

## 2020-04-06 LAB — POCT INR: INR: 2.6 (ref 2.0–3.0)

## 2020-04-06 NOTE — Patient Instructions (Signed)
Continue warfarin 1 tablet daily except 1/2 tablet on Mondays and Thursdays Has been diagnosed with Alpha Gal.   Keep greens consistant Recheck in 4 week

## 2020-04-11 ENCOUNTER — Telehealth: Payer: Self-pay | Admitting: Cardiovascular Disease

## 2020-04-11 NOTE — Telephone Encounter (Signed)
Pt aware and appreciative  

## 2020-04-11 NOTE — Telephone Encounter (Signed)
Patient was told to check with Doctor about getting Merdema vaccine to low platlets and clotting before getting the vaccine

## 2020-04-11 NOTE — Telephone Encounter (Signed)
I am not aware of a specific reason that she cannot get the Moderna coronavirus vaccine based on her history.

## 2020-04-12 ENCOUNTER — Telehealth: Payer: Self-pay | Admitting: Cardiology

## 2020-04-12 NOTE — Telephone Encounter (Signed)
Patient walked in  [facility] will not give patient the [facility] Vaccine with out a letter from Dr Diona Browner stating that she can have it with her clotting issues

## 2020-04-12 NOTE — Telephone Encounter (Signed)
Spoke with [place] at [employer] and per yesterday's phone not [place] made aware that Dr Diona Browner was not aware of any specific reason she could not have the Moderna vaccine based on her history - [facility] will contact pt - they did not need a letter sent since they have spoken with out office

## 2020-04-15 ENCOUNTER — Other Ambulatory Visit: Payer: Self-pay | Admitting: Cardiology

## 2020-05-08 ENCOUNTER — Ambulatory Visit (INDEPENDENT_AMBULATORY_CARE_PROVIDER_SITE_OTHER): Payer: Medicare PPO | Admitting: *Deleted

## 2020-05-08 ENCOUNTER — Other Ambulatory Visit: Payer: Self-pay

## 2020-05-08 DIAGNOSIS — D6861 Antiphospholipid syndrome: Secondary | ICD-10-CM

## 2020-05-08 DIAGNOSIS — D6859 Other primary thrombophilia: Secondary | ICD-10-CM | POA: Diagnosis not present

## 2020-05-08 DIAGNOSIS — I63549 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: Secondary | ICD-10-CM

## 2020-05-08 DIAGNOSIS — Z5181 Encounter for therapeutic drug level monitoring: Secondary | ICD-10-CM | POA: Diagnosis not present

## 2020-05-08 LAB — POCT INR: INR: 1.5 — AB (ref 2.0–3.0)

## 2020-05-08 NOTE — Patient Instructions (Signed)
Take warfarin 1 tablet today, 1 1/2 tablets tomorrow then resume 1 tablet daily except 1/2 tablet on Mondays and Thursdays Has been diagnosed with Alpha Gal.   Keep greens consistant Recheck in 2 weeks

## 2020-05-18 ENCOUNTER — Ambulatory Visit (INDEPENDENT_AMBULATORY_CARE_PROVIDER_SITE_OTHER): Payer: Medicare PPO | Admitting: Cardiology

## 2020-05-18 ENCOUNTER — Ambulatory Visit (INDEPENDENT_AMBULATORY_CARE_PROVIDER_SITE_OTHER): Payer: Medicare PPO | Admitting: Pharmacist

## 2020-05-18 ENCOUNTER — Other Ambulatory Visit: Payer: Self-pay

## 2020-05-18 ENCOUNTER — Encounter: Payer: Self-pay | Admitting: Cardiology

## 2020-05-18 VITALS — BP 112/64 | HR 68 | Ht 66.0 in | Wt 228.0 lb

## 2020-05-18 DIAGNOSIS — Z5181 Encounter for therapeutic drug level monitoring: Secondary | ICD-10-CM

## 2020-05-18 DIAGNOSIS — I5022 Chronic systolic (congestive) heart failure: Secondary | ICD-10-CM | POA: Diagnosis not present

## 2020-05-18 DIAGNOSIS — D6859 Other primary thrombophilia: Secondary | ICD-10-CM

## 2020-05-18 DIAGNOSIS — I63549 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: Secondary | ICD-10-CM | POA: Diagnosis not present

## 2020-05-18 DIAGNOSIS — I25119 Atherosclerotic heart disease of native coronary artery with unspecified angina pectoris: Secondary | ICD-10-CM | POA: Diagnosis not present

## 2020-05-18 DIAGNOSIS — D6861 Antiphospholipid syndrome: Secondary | ICD-10-CM

## 2020-05-18 DIAGNOSIS — I255 Ischemic cardiomyopathy: Secondary | ICD-10-CM | POA: Diagnosis not present

## 2020-05-18 DIAGNOSIS — I679 Cerebrovascular disease, unspecified: Secondary | ICD-10-CM

## 2020-05-18 LAB — POCT INR: INR: 2.6 (ref 2.0–3.0)

## 2020-05-18 NOTE — Progress Notes (Signed)
Cardiology Office Note  Date: 05/18/2020   ID: [name] [name], DOB 13-Nov-1961, MRN [id number]  PCP:  Ignatius Specking, MD  Cardiologist:  Nona Dell, MD Electrophysiologist:  None   Chief Complaint  Patient presents with  . Cardiac follow-up    History of Present Illness: [name] [name] is a 59 y.o. female last assessed via telehealth encounter in January.  She presents for a routine visit.  She has had no significant angina symptoms with stable NYHA class II dyspnea.  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  She sees Dr. Ladona Ridgel, [facility] ICD in place.  Recent device interrogation did not reveal any shocks, but she did have some atrial episodes suggestive of atrial flutter as well as NSVT.  She continues on Coumadin with follow-up in the anticoagulation clinic. She does not report any bleeding problems.  I reviewed her cardiac regimen which is stable and outlined below.  She does not report any intolerances.  I personally reviewed her ECG today which shows sinus rhythm with low voltage, old anteroseptal and high lateral infarct pattern.  Past Medical History:  Diagnosis Date  . Aneurysm of internal carotid artery   . Anticardiolipin antibody positive    Chronic Coumadin  . Chronic combined systolic (congestive) and diastolic (congestive) heart failure (HCC)   . Cocaine abuse in remission (HCC)   . Coronary atherosclerosis of native coronary artery    Previous invasive cardiac testing was done at a facility in [place], [place].  Occluded diagonal, Diffuse LAD disease, Occluded Cx, and non obstructive RCA.   . Essential hypertension   . Headache   . History of pneumonia   . Ischemic cardiomyopathy    LVEF 30-35%  . Mixed hyperlipidemia   . PVD (peripheral vascular disease) (HCC)   . Raynaud's phenomenon   . Stroke [facility]) 2007   Total  occlusion of the right internal carotid    Past Surgical History:  Procedure Laterality Date  . ABDOMINAL AORTAGRAM N/A 05/25/2014   Procedure: ABDOMINAL Ronny Flurry;  Surgeon: Iran Ouch, MD;  Location: [facility] CATH LAB;  Service: Cardiovascular;  Laterality: N/A;  . APPENDECTOMY    . CARDIAC DEFIBRILLATOR PLACEMENT     St.Jude ICD  . EP IMPLANTABLE DEVICE N/A 03/01/2016   Procedure: ICD Generator Changeout;  Surgeon: [name], MD;  Location: [facility] INVASIVE CV LAB;  Service: Cardiovascular;  Laterality: N/A;  . L1 corpectomy   12/10  . Laryngeal polyp excision      Current Outpatient Medications  Medication Sig Dispense Refill  . carvedilol (COREG) 12.5 MG tablet TAKE 1 TABLET BY MOUTH TWICE DAILY WITH MEALS 180 tablet 0  . DULoxetine (CYMBALTA) 60 MG capsule Take 60 mg by mouth every morning.    . fluticasone (FLONASE) 50 MCG/ACT nasal spray Place 1 spray into both nostrils 2 (two) times daily.    . furosemide (LASIX) 40 MG tablet TAKE 1 TABLET BY MOUTH DAILY AS NEEDED FOR SWELLING. 30 tablet 6  . furosemide (LASIX) 80 MG tablet TAKE 1 TABLET BY MOUTH DAILY (takes 80 AND 40 MG) 90 tablet 1  . gabapentin (NEURONTIN) 300 MG capsule Take 300 mg by mouth 2 (two) times daily.    Marland Kitchen guaiFENesin (MUCINEX) 600 MG 12 hr tablet Take 2 tablets (1,200 mg total) by mouth 2 (two) times daily.  40 tablet 1  . methocarbamol (ROBAXIN) 750 MG tablet Take 750 mg by mouth every 8 (eight) hours as needed for muscle spasms.    . Multiple Vitamins-Minerals (MULTIVITAMINS THER. W/MINERALS) TABS Take 1 tablet by mouth daily.      . nitroGLYCERIN (NITROSTAT) 0.4 MG SL tablet PLACE 1 TABLET UNDER THE TONGUE EVERY 5 MINUTES FOR 3 DOSES AS NEEDED CHEST PAIN 25 tablet 3  . oxyCODONE-acetaminophen (PERCOCET) 10-325 MG tablet Take 1 tablet by mouth 4 (four) times daily as needed.    . potassium chloride SA (KLOR-CON) 20 MEQ tablet TAKE 1 TABLET BY MOUTH EVERY DAY 90 tablet 1  . promethazine (PHENERGAN) 25 MG tablet  Take 25 mg by mouth every 6 (six) hours as needed for nausea.    . sacubitril-valsartan (ENTRESTO) 24-26 MG TAKE 1 TABLET BY MOUTH TWICE DAILY - STOP LOSARTAN & IMDUR 28 tablet 0  . spironolactone (ALDACTONE) 25 MG tablet TAKE 1 TABLET BY MOUTH EVERY DAY 90 tablet 1  . warfarin (COUMADIN) 10 MG tablet Take 1 tablet daily except 1/2 tablet on Mondays and Thursdays 30 tablet 3  . famotidine (PEPCID) 20 MG tablet Take 1 tablet (20 mg total) by mouth 2 (two) times daily. (Patient taking differently: Take 20 mg by mouth daily. ) 60 tablet 1   No current facility-administered medications for this visit.   Allergies:  Metoclopramide hcl, Penicillins, Metoprolol, Reglan [metoclopramide], and Statins   ROS:   No palpitations or syncope.  Physical Exam: VS:  BP 112/64   Pulse 68   Ht 5\' 6"  (1.676 m)   Wt 228 lb (103.4 kg)   SpO2 97%   BMI 36.80 kg/m , BMI Body mass index is 36.8 kg/m.  Wt Readings from Last 3 Encounters:  05/18/20 228 lb (103.4 kg)  04/04/20 226 lb (102.5 kg)  01/17/20 221 lb (100.2 kg)    General: Patient appears comfortable at rest. HEENT: Conjunctiva and lids normal, wearing a mask. Neck: Supple, no elevated JVP or carotid bruits, no thyromegaly. Lungs: Clear to auscultation, nonlabored breathing at rest. Cardiac: Regular rate and rhythm, no S3 or significant systolic murmur, no pericardial rub. Extremities: No pitting edema, distal pulses 2+.  ECG:  An ECG dated 12/31/2018 was personally reviewed today and demonstrated:  Sinus rhythm with IVCD and PAC, old anteroseptal infarct and high lateral infarct pattern.  Recent Labwork: 07/27/2019: ALT 16; AST 20; BUN 13; Creatinine, Ser 0.77; Hemoglobin 13.7; Platelets 173; Potassium 4.1; Sodium 139   Other Studies Reviewed Today:  Echocardiogram 01/06/2020: 1. Left ventricular ejection fraction, by visual estimation, is 25 to 30%. The left ventricle has severely decreased function. There is no left ventricular  hypertrophy. 2. Elevated left atrial pressure. 3. Left ventricular diastolic parameters are consistent with Grade I diastolic dysfunction (impaired relaxation). 4. Severely dilated left ventricular internal cavity size. 5. The left ventricle demonstrates global hypokinesis. 6. Global right ventricle has normal systolic function.The right ventricular size is normal. No increase in right ventricular wall thickness. 7. Left atrial size was severely dilated. 8. Right atrial size was normal. 9. The mitral valve is normal in structure. Moderate mitral valve regurgitation. No evidence of mitral stenosis. 10. The tricuspid valve is normal in structure. 11. The aortic valve has an indeterminant number of cusps. Aortic valve regurgitation is not visualized. No evidence of aortic valve sclerosis or stenosis. 12. The pulmonic valve was not well visualized. Pulmonic valve regurgitation is not visualized. 13. Normal pulmonary artery systolic pressure. 14. A pacer  wire is visualized. 15. The inferior vena cava is normal in size with greater than 50% respiratory variability, suggesting right atrial pressure of 3 mmHg.  Assessment and Plan:  1.  Ischemic cardiomyopathy with chronic systolic heart failure.  She is clinically stable at this time, NYHA class II dyspnea and no active angina.  ECG reviewed.  Continue Coreg, Entresto, Aldactone, and Lasix with potassium supplement.  2. St. Jude ICD in place.  No device shocks or syncope.  She is following with Dr. Ladona Ridgel.  3.  Hypercoagulable state with anticardiolipin antibody.  She is on long-term Coumadin with follow-up in the anticoagulation clinic.  4.  Bilateral carotid artery disease with known occlusion of the R ICA.  She remains asymptomatic.  Medication Adjustments/Labs and Tests Ordered: Current medicines are reviewed at length with the patient today.  Concerns regarding medicines are outlined above.   Tests Ordered: Orders Placed This  Encounter  Procedures  . EKG 12-Lead    Medication Changes: No orders of the defined types were placed in this encounter.   Disposition:  Follow up 4 months in the [place] office.  Signed, [name], MD, [facility] 05/18/2020 2:12 PM    [facility] at [facility] [address] [place], [place], [place] [address] Phone: [phone]; Fax: [phone]

## 2020-05-18 NOTE — Patient Instructions (Addendum)
Medication Instructions:   Your physician recommends that you continue on your current medications as directed. Please refer to the Current Medication list given to you today.  Labwork:  NONE  Testing/Procedures:  NONE  Follow-Up:  Your physician recommends that you schedule a follow-up appointment in: 4 months (office).  Any Other Special Instructions Will Be Listed Below (If Applicable).  If you need a refill on your cardiac medications before your next appointment, please call your pharmacy. 

## 2020-05-18 NOTE — Patient Instructions (Signed)
Description   Continue taking 1 tablet daily except 1/2 tablet on Mondays and Thursdays Has been diagnosed with Alpha Gal.   Keep greens consistant Recheck in 4 weeks

## 2020-06-02 ENCOUNTER — Ambulatory Visit (INDEPENDENT_AMBULATORY_CARE_PROVIDER_SITE_OTHER): Payer: Medicare PPO | Admitting: *Deleted

## 2020-06-02 ENCOUNTER — Telehealth: Payer: Self-pay

## 2020-06-02 DIAGNOSIS — I255 Ischemic cardiomyopathy: Secondary | ICD-10-CM

## 2020-06-02 NOTE — Telephone Encounter (Signed)
Spoke with patient to remind of missed remote transmission 

## 2020-06-04 LAB — CUP PACEART REMOTE DEVICE CHECK
Battery Remaining Longevity: 126 mo
Battery Remaining Percentage: 100 %
Brady Statistic RA Percent Paced: 0 %
Brady Statistic RV Percent Paced: 0 %
Date Time Interrogation Session: 20210604022200
HighPow Impedance: 52 Ohm
Implantable Lead Implant Date: 20081119
Implantable Lead Implant Date: 20081119
Implantable Lead Location: 753859
Implantable Lead Location: 753860
Implantable Lead Model: 185
Implantable Lead Model: 4136
Implantable Lead Serial Number: 211124
Implantable Lead Serial Number: 28383796
Implantable Pulse Generator Implant Date: 20170303
Lead Channel Impedance Value: 454 Ohm
Lead Channel Impedance Value: 516 Ohm
Lead Channel Pacing Threshold Amplitude: 0.5 V
Lead Channel Pacing Threshold Amplitude: 1 V
Lead Channel Pacing Threshold Pulse Width: 0.5 ms
Lead Channel Pacing Threshold Pulse Width: 0.5 ms
Lead Channel Setting Pacing Amplitude: 2 V
Lead Channel Setting Pacing Amplitude: 2.4 V
Lead Channel Setting Pacing Pulse Width: 0.5 ms
Lead Channel Setting Sensing Sensitivity: 0.6 mV
Pulse Gen Serial Number: 204398

## 2020-06-06 ENCOUNTER — Encounter: Payer: Self-pay | Admitting: Orthopaedic Surgery

## 2020-06-06 ENCOUNTER — Telehealth: Payer: Self-pay | Admitting: Orthopaedic Surgery

## 2020-06-06 ENCOUNTER — Other Ambulatory Visit: Payer: Self-pay | Admitting: Cardiology

## 2020-06-06 NOTE — Telephone Encounter (Signed)
This is a new one for me.  How would they know about elbow injury?    Give note saying she can drive.

## 2020-06-06 NOTE — Telephone Encounter (Signed)
Patient said she did not have any idea how they would have that information. Note issued for patient to have; will fax upon letter received by patient and authorization to release.

## 2020-06-06 NOTE — Telephone Encounter (Signed)
Patient states she is needing a letter to [facility] indicating she is cleared from her fractured elbow. States she was pulled for a tail light out and was told she has a medical suspension. States thought it may be due to defib; said was advised it was due to elbow fracture.  Please advise if okay to issue letter of clearance from last visit, or if another appointment is needed. Patient provided [facility] Fax# [phone].

## 2020-06-07 NOTE — Progress Notes (Signed)
Remote ICD transmission.   

## 2020-06-13 ENCOUNTER — Telehealth: Payer: Self-pay | Admitting: Cardiology

## 2020-06-13 MED ORDER — ENTRESTO 24-26 MG PO TABS: ORAL_TABLET | ORAL | 0 refills | Status: DC

## 2020-06-13 NOTE — Telephone Encounter (Signed)
Pt will come by to pick up samples and also given new application for pt assistance

## 2020-06-13 NOTE — Telephone Encounter (Signed)
Pt is needing to fill out forms for [facility] assistance and would like to know if she can get some samples till she's approved   Please give pt a call @ [phone]

## 2020-06-14 NOTE — Telephone Encounter (Signed)
Patient has brought in letter from [facility] and has signed authorization for information requested to be faxed with letter and form. Form is in Dr Sanjuan Dame box.

## 2020-06-19 ENCOUNTER — Other Ambulatory Visit: Payer: Self-pay

## 2020-06-19 ENCOUNTER — Ambulatory Visit (INDEPENDENT_AMBULATORY_CARE_PROVIDER_SITE_OTHER): Payer: Medicare PPO | Admitting: *Deleted

## 2020-06-19 DIAGNOSIS — I63549 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: Secondary | ICD-10-CM | POA: Diagnosis not present

## 2020-06-19 DIAGNOSIS — Z5181 Encounter for therapeutic drug level monitoring: Secondary | ICD-10-CM

## 2020-06-19 DIAGNOSIS — D6859 Other primary thrombophilia: Secondary | ICD-10-CM

## 2020-06-19 DIAGNOSIS — D6861 Antiphospholipid syndrome: Secondary | ICD-10-CM

## 2020-06-19 LAB — POCT INR: INR: 3 (ref 2.0–3.0)

## 2020-06-19 NOTE — Patient Instructions (Signed)
Continue taking 1 tablet daily except 1/2 tablet on Mondays and Thursdays Has been diagnosed with Alpha Gal.   Keep greens consistant Recheck in 4 weeks

## 2020-06-21 ENCOUNTER — Other Ambulatory Visit: Payer: Self-pay | Admitting: *Deleted

## 2020-06-21 ENCOUNTER — Other Ambulatory Visit: Payer: Self-pay | Admitting: Cardiology

## 2020-06-21 MED ORDER — ENTRESTO 24-26 MG PO TABS: 1.0000 | ORAL_TABLET | Freq: Two times a day (BID) | ORAL | 0 refills | Status: DC

## 2020-06-23 ENCOUNTER — Encounter: Payer: Self-pay | Admitting: *Deleted

## 2020-06-23 NOTE — Progress Notes (Signed)
Notification received from [employer] that entresto approved through 12/29/2020.

## 2020-07-04 ENCOUNTER — Other Ambulatory Visit: Payer: Self-pay | Admitting: *Deleted

## 2020-07-04 MED ORDER — ENTRESTO 24-26 MG PO TABS: 1.0000 | ORAL_TABLET | Freq: Two times a day (BID) | ORAL | 0 refills | Status: DC

## 2020-07-19 ENCOUNTER — Ambulatory Visit (INDEPENDENT_AMBULATORY_CARE_PROVIDER_SITE_OTHER): Payer: Medicare PPO | Admitting: *Deleted

## 2020-07-19 DIAGNOSIS — D6859 Other primary thrombophilia: Secondary | ICD-10-CM | POA: Diagnosis not present

## 2020-07-19 DIAGNOSIS — I63549 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: Secondary | ICD-10-CM

## 2020-07-19 DIAGNOSIS — Z5181 Encounter for therapeutic drug level monitoring: Secondary | ICD-10-CM

## 2020-07-19 LAB — POCT INR: INR: 2.2 (ref 2.0–3.0)

## 2020-07-19 NOTE — Patient Instructions (Signed)
Continue taking 1 tablet daily except 1/2 tablet on Mondays and Thursdays Has been diagnosed with Alpha Gal.   Keep greens consistant Recheck in 6 weeks

## 2020-08-02 ENCOUNTER — Telehealth: Payer: Self-pay | Admitting: Cardiology

## 2020-08-02 NOTE — Telephone Encounter (Signed)
Pt called stating she's having bilateral foot swelling, stated she did take an extra lasix yesterday and this morning.  Stated she did have a pain med change from one of her other Drs concerning her back and didn't know if it could be coming from that.  Please call [phone]

## 2020-08-02 NOTE — Telephone Encounter (Signed)
Reports having a 4 lbs weight gain in a 2 days. Reports doing a lot of walking recently and had swelling in both feet on yesterday. Reports taking an extra 80 mg lasix last night and this morning. Denies dizziness, chest pain or sob.  Advised to weigh daily and if she has a 2-3 lbs weight gain in 24 hours or 5 lbs in one week, to use her as needed lasix as prescribed. Advised to continue monitoring swelling in feet and if she has no improvement to contact our office. Verbalized understanding of plan.

## 2020-08-07 ENCOUNTER — Other Ambulatory Visit: Payer: Self-pay | Admitting: *Deleted

## 2020-08-07 DIAGNOSIS — I5042 Chronic combined systolic (congestive) and diastolic (congestive) heart failure: Secondary | ICD-10-CM

## 2020-08-07 MED ORDER — ENTRESTO 24-26 MG PO TABS: 1.0000 | ORAL_TABLET | Freq: Two times a day (BID) | ORAL | 3 refills | Status: DC

## 2020-08-08 DIAGNOSIS — L810 Postinflammatory hyperpigmentation: Secondary | ICD-10-CM | POA: Diagnosis not present

## 2020-08-08 DIAGNOSIS — M545 Low back pain: Secondary | ICD-10-CM | POA: Diagnosis not present

## 2020-08-08 DIAGNOSIS — I779 Disorder of arteries and arterioles, unspecified: Secondary | ICD-10-CM | POA: Diagnosis not present

## 2020-08-08 DIAGNOSIS — D6861 Antiphospholipid syndrome: Secondary | ICD-10-CM | POA: Diagnosis not present

## 2020-08-08 DIAGNOSIS — I5022 Chronic systolic (congestive) heart failure: Secondary | ICD-10-CM | POA: Diagnosis not present

## 2020-08-08 DIAGNOSIS — Z299 Encounter for prophylactic measures, unspecified: Secondary | ICD-10-CM | POA: Diagnosis not present

## 2020-08-10 ENCOUNTER — Other Ambulatory Visit: Payer: Self-pay | Admitting: Cardiology

## 2020-08-11 ENCOUNTER — Emergency Department (HOSPITAL_COMMUNITY): Payer: Medicare PPO

## 2020-08-11 ENCOUNTER — Inpatient Hospital Stay (HOSPITAL_COMMUNITY)
Admission: EM | Admit: 2020-08-11 | Discharge: 2020-08-15 | DRG: 291 | Disposition: A | Discharge status: Home / Self Care | Payer: Medicare PPO | Attending: Internal Medicine | Admitting: Internal Medicine

## 2020-08-11 ENCOUNTER — Encounter (HOSPITAL_COMMUNITY): Payer: Self-pay

## 2020-08-11 ENCOUNTER — Other Ambulatory Visit: Payer: Self-pay

## 2020-08-11 ENCOUNTER — Inpatient Hospital Stay (HOSPITAL_COMMUNITY): Payer: Medicare PPO

## 2020-08-11 DIAGNOSIS — Z7901 Long term (current) use of anticoagulants: Secondary | ICD-10-CM | POA: Diagnosis not present

## 2020-08-11 DIAGNOSIS — J9601 Acute respiratory failure with hypoxia: Secondary | ICD-10-CM | POA: Diagnosis not present

## 2020-08-11 DIAGNOSIS — E872 Acidosis: Secondary | ICD-10-CM | POA: Diagnosis not present

## 2020-08-11 DIAGNOSIS — I5042 Chronic combined systolic (congestive) and diastolic (congestive) heart failure: Secondary | ICD-10-CM | POA: Diagnosis not present

## 2020-08-11 DIAGNOSIS — R000 Tachycardia, unspecified: Secondary | ICD-10-CM | POA: Diagnosis not present

## 2020-08-11 DIAGNOSIS — E782 Mixed hyperlipidemia: Secondary | ICD-10-CM | POA: Diagnosis present

## 2020-08-11 DIAGNOSIS — I5023 Acute on chronic systolic (congestive) heart failure: Secondary | ICD-10-CM | POA: Diagnosis not present

## 2020-08-11 DIAGNOSIS — E876 Hypokalemia: Secondary | ICD-10-CM

## 2020-08-11 DIAGNOSIS — Z20822 Contact with and (suspected) exposure to covid-19: Secondary | ICD-10-CM | POA: Diagnosis present

## 2020-08-11 DIAGNOSIS — R0689 Other abnormalities of breathing: Secondary | ICD-10-CM | POA: Diagnosis not present

## 2020-08-11 DIAGNOSIS — R0602 Shortness of breath: Secondary | ICD-10-CM

## 2020-08-11 DIAGNOSIS — Z95810 Presence of automatic (implantable) cardiac defibrillator: Secondary | ICD-10-CM | POA: Diagnosis present

## 2020-08-11 DIAGNOSIS — K219 Gastro-esophageal reflux disease without esophagitis: Secondary | ICD-10-CM | POA: Diagnosis not present

## 2020-08-11 DIAGNOSIS — I517 Cardiomegaly: Secondary | ICD-10-CM | POA: Diagnosis not present

## 2020-08-11 DIAGNOSIS — R0902 Hypoxemia: Secondary | ICD-10-CM | POA: Diagnosis not present

## 2020-08-11 DIAGNOSIS — Z880 Allergy status to penicillin: Secondary | ICD-10-CM | POA: Diagnosis not present

## 2020-08-11 DIAGNOSIS — I10 Essential (primary) hypertension: Secondary | ICD-10-CM | POA: Diagnosis present

## 2020-08-11 DIAGNOSIS — E785 Hyperlipidemia, unspecified: Secondary | ICD-10-CM | POA: Diagnosis present

## 2020-08-11 DIAGNOSIS — Z8249 Family history of ischemic heart disease and other diseases of the circulatory system: Secondary | ICD-10-CM | POA: Diagnosis not present

## 2020-08-11 DIAGNOSIS — Z888 Allergy status to other drugs, medicaments and biological substances status: Secondary | ICD-10-CM

## 2020-08-11 DIAGNOSIS — J309 Allergic rhinitis, unspecified: Secondary | ICD-10-CM | POA: Diagnosis not present

## 2020-08-11 DIAGNOSIS — R062 Wheezing: Secondary | ICD-10-CM | POA: Diagnosis not present

## 2020-08-11 DIAGNOSIS — I7300 Raynaud's syndrome without gangrene: Secondary | ICD-10-CM | POA: Diagnosis present

## 2020-08-11 DIAGNOSIS — Z23 Encounter for immunization: Secondary | ICD-10-CM | POA: Diagnosis not present

## 2020-08-11 DIAGNOSIS — R079 Chest pain, unspecified: Secondary | ICD-10-CM | POA: Diagnosis not present

## 2020-08-11 DIAGNOSIS — D6861 Antiphospholipid syndrome: Secondary | ICD-10-CM | POA: Diagnosis present

## 2020-08-11 DIAGNOSIS — I255 Ischemic cardiomyopathy: Secondary | ICD-10-CM | POA: Diagnosis present

## 2020-08-11 DIAGNOSIS — F17210 Nicotine dependence, cigarettes, uncomplicated: Secondary | ICD-10-CM | POA: Diagnosis present

## 2020-08-11 DIAGNOSIS — Z79899 Other long term (current) drug therapy: Secondary | ICD-10-CM

## 2020-08-11 DIAGNOSIS — I509 Heart failure, unspecified: Secondary | ICD-10-CM

## 2020-08-11 DIAGNOSIS — I110 Hypertensive heart disease with heart failure: Principal | ICD-10-CM | POA: Diagnosis present

## 2020-08-11 DIAGNOSIS — Z9049 Acquired absence of other specified parts of digestive tract: Secondary | ICD-10-CM | POA: Diagnosis not present

## 2020-08-11 DIAGNOSIS — J90 Pleural effusion, not elsewhere classified: Secondary | ICD-10-CM | POA: Diagnosis not present

## 2020-08-11 DIAGNOSIS — E871 Hypo-osmolality and hyponatremia: Secondary | ICD-10-CM | POA: Diagnosis not present

## 2020-08-11 DIAGNOSIS — I5043 Acute on chronic combined systolic (congestive) and diastolic (congestive) heart failure: Secondary | ICD-10-CM | POA: Diagnosis present

## 2020-08-11 DIAGNOSIS — Z91018 Allergy to other foods: Secondary | ICD-10-CM

## 2020-08-11 DIAGNOSIS — Z8673 Personal history of transient ischemic attack (TIA), and cerebral infarction without residual deficits: Secondary | ICD-10-CM | POA: Diagnosis not present

## 2020-08-11 DIAGNOSIS — F17200 Nicotine dependence, unspecified, uncomplicated: Secondary | ICD-10-CM | POA: Diagnosis not present

## 2020-08-11 DIAGNOSIS — E669 Obesity, unspecified: Secondary | ICD-10-CM | POA: Diagnosis present

## 2020-08-11 DIAGNOSIS — I5021 Acute systolic (congestive) heart failure: Secondary | ICD-10-CM

## 2020-08-11 DIAGNOSIS — Z6838 Body mass index (BMI) 38.0-38.9, adult: Secondary | ICD-10-CM

## 2020-08-11 LAB — ECHOCARDIOGRAM COMPLETE
AV Mean grad: 4.1 mmHg
AV Peak grad: 7.7 mmHg
Ao pk vel: 1.39 m/s
Area-P 1/2: 4.46 cm2
Height: 65 in
MV M vel: 4.16 m/s
MV Peak grad: 69.2 mmHg
S' Lateral: 5.42 cm
Weight: 3680 oz

## 2020-08-11 LAB — COMPREHENSIVE METABOLIC PANEL
ALT: 18 U/L (ref 0–44)
AST: 19 U/L (ref 15–41)
Albumin: 3.7 g/dL (ref 3.5–5.0)
Alkaline Phosphatase: 56 U/L (ref 38–126)
Anion gap: 11 (ref 5–15)
BUN: 13 mg/dL (ref 6–20)
CO2: 26 mmol/L (ref 22–32)
Calcium: 9 mg/dL (ref 8.9–10.3)
Chloride: 97 mmol/L — ABNORMAL LOW (ref 98–111)
Creatinine, Ser: 0.62 mg/dL (ref 0.44–1.00)
GFR calc Af Amer: >60 mL/min (ref >60)
GFR calc non Af Amer: >60 mL/min (ref >60)
Glucose, Bld: 87 mg/dL (ref 70–99)
Potassium: 3.3 mmol/L — ABNORMAL LOW (ref 3.5–5.1)
Sodium: 134 mmol/L — ABNORMAL LOW (ref 135–145)
Total Bilirubin: 0.5 mg/dL (ref 0.3–1.2)
Total Protein: 7.3 g/dL (ref 6.5–8.1)

## 2020-08-11 LAB — CBC WITH DIFFERENTIAL/PLATELET
Abs Immature Granulocytes: 0.05 10*3/uL (ref 0.00–0.07)
Basophils Absolute: 0.1 10*3/uL (ref 0.0–0.1)
Basophils Relative: 1 %
Eosinophils Absolute: 0.4 10*3/uL (ref 0.0–0.5)
Eosinophils Relative: 3 %
HCT: 43.9 % (ref 36.0–46.0)
Hemoglobin: 14.1 g/dL (ref 12.0–15.0)
Immature Granulocytes: 1 %
Lymphocytes Relative: 16 %
Lymphs Abs: 1.7 10*3/uL (ref 0.7–4.0)
MCH: 29.6 pg (ref 26.0–34.0)
MCHC: 32.1 g/dL (ref 30.0–36.0)
MCV: 92.2 fL (ref 80.0–100.0)
Monocytes Absolute: 0.6 10*3/uL (ref 0.1–1.0)
Monocytes Relative: 5 %
Neutro Abs: 8.2 10*3/uL — ABNORMAL HIGH (ref 1.7–7.7)
Neutrophils Relative %: 74 %
Platelets: 123 10*3/uL — ABNORMAL LOW (ref 150–400)
RBC: 4.76 MIL/uL (ref 3.87–5.11)
RDW: 13.4 % (ref 11.5–15.5)
WBC: 11 10*3/uL — ABNORMAL HIGH (ref 4.0–10.5)
nRBC: 0 % (ref 0.0–0.2)

## 2020-08-11 LAB — BLOOD GAS, VENOUS
Acid-base deficit: 5.4 mmol/L — ABNORMAL HIGH (ref 0.0–2.0)
Bicarbonate: 20.4 mmol/L (ref 20.0–28.0)
Drawn by: 1528
FIO2: 100
O2 Saturation: 86.5 %
Patient temperature: 37
pCO2, Ven: 31 mmHg — ABNORMAL LOW (ref 44.0–60.0)
pH, Ven: 7.395 (ref 7.250–7.430)
pO2, Ven: 56.8 mmHg — ABNORMAL HIGH (ref 32.0–45.0)

## 2020-08-11 LAB — TROPONIN I (HIGH SENSITIVITY)
Troponin I (High Sensitivity): 13 ng/L (ref <18)
Troponin I (High Sensitivity): 12 ng/L (ref <18)

## 2020-08-11 LAB — LACTIC ACID, PLASMA
Lactic Acid, Venous: 3.7 mmol/L (ref 0.5–1.9)
Lactic Acid, Venous: 1.6 mmol/L (ref 0.5–1.9)

## 2020-08-11 LAB — BRAIN NATRIURETIC PEPTIDE: B Natriuretic Peptide: 571 pg/mL — ABNORMAL HIGH (ref 0.0–100.0)

## 2020-08-11 LAB — PHOSPHORUS: Phosphorus: 4.3 mg/dL (ref 2.5–4.6)

## 2020-08-11 LAB — MAGNESIUM: Magnesium: 2 mg/dL (ref 1.7–2.4)

## 2020-08-11 LAB — SARS CORONAVIRUS 2 BY RT PCR (HOSPITAL ORDER, PERFORMED IN ~~LOC~~ HOSPITAL LAB): SARS Coronavirus 2: NEGATIVE

## 2020-08-11 LAB — HIV ANTIBODY (ROUTINE TESTING W REFLEX): HIV Screen 4th Generation wRfx: NONREACTIVE

## 2020-08-11 LAB — PROTIME-INR
INR: 2.1 — ABNORMAL HIGH (ref 0.8–1.2)
Prothrombin Time: 22.4 seconds — ABNORMAL HIGH (ref 11.4–15.2)

## 2020-08-11 LAB — D-DIMER, QUANTITATIVE: D-Dimer, Quant: 0.96 ug/mL-FEU — ABNORMAL HIGH (ref 0.00–0.50)

## 2020-08-11 MED ORDER — FAMOTIDINE 20 MG PO TABS
20.0000 mg | ORAL_TABLET | Freq: Every day | ORAL | Status: DC
  Administered 2020-08-11 – 2020-08-15 (×5): 20 mg via ORAL
  Filled 2020-08-11 (×5): qty 1

## 2020-08-11 MED ORDER — ALBUTEROL (5 MG/ML) CONTINUOUS INHALATION SOLN
INHALATION_SOLUTION | RESPIRATORY_TRACT | Status: AC
  Filled 2020-08-11: qty 20

## 2020-08-11 MED ORDER — WARFARIN SODIUM 5 MG PO TABS
10.0000 mg | ORAL_TABLET | ORAL | Status: DC
  Administered 2020-08-11 – 2020-08-12 (×2): 10 mg via ORAL
  Filled 2020-08-11 (×2): qty 2

## 2020-08-11 MED ORDER — FUROSEMIDE 10 MG/ML IJ SOLN
60.0000 mg | Freq: Once | INTRAMUSCULAR | Status: AC
  Administered 2020-08-11: 60 mg via INTRAVENOUS
  Filled 2020-08-11: qty 6

## 2020-08-11 MED ORDER — PNEUMOCOCCAL VAC POLYVALENT 25 MCG/0.5ML IJ INJ
0.5000 mL | INJECTION | INTRAMUSCULAR | Status: AC
  Administered 2020-08-13: 0.5 mL via INTRAMUSCULAR
  Filled 2020-08-11: qty 0.5

## 2020-08-11 MED ORDER — HYDROCODONE-ACETAMINOPHEN 5-325 MG PO TABS
1.0000 | ORAL_TABLET | Freq: Four times a day (QID) | ORAL | Status: DC | PRN
  Administered 2020-08-11 – 2020-08-14 (×12): 1 via ORAL
  Filled 2020-08-11 (×12): qty 1

## 2020-08-11 MED ORDER — HYDROXYZINE HCL 25 MG PO TABS
25.0000 mg | ORAL_TABLET | Freq: Once | ORAL | Status: AC
  Administered 2020-08-11: 25 mg via ORAL
  Filled 2020-08-11: qty 1

## 2020-08-11 MED ORDER — POTASSIUM CHLORIDE CRYS ER 20 MEQ PO TBCR
40.0000 meq | EXTENDED_RELEASE_TABLET | ORAL | Status: AC
  Administered 2020-08-11 (×2): 40 meq via ORAL
  Filled 2020-08-11 (×2): qty 2

## 2020-08-11 MED ORDER — ORAL CARE MOUTH RINSE
15.0000 mL | Freq: Two times a day (BID) | OROMUCOSAL | Status: DC
  Administered 2020-08-11 – 2020-08-15 (×7): 15 mL via OROMUCOSAL

## 2020-08-11 MED ORDER — GUAIFENESIN ER 600 MG PO TB12
1200.0000 mg | ORAL_TABLET | Freq: Two times a day (BID) | ORAL | Status: DC
  Administered 2020-08-11 – 2020-08-15 (×7): 1200 mg via ORAL
  Filled 2020-08-11 (×7): qty 2

## 2020-08-11 MED ORDER — FUROSEMIDE 10 MG/ML IJ SOLN
40.0000 mg | Freq: Two times a day (BID) | INTRAMUSCULAR | Status: DC
  Administered 2020-08-11 – 2020-08-15 (×9): 40 mg via INTRAVENOUS
  Filled 2020-08-11 (×10): qty 4

## 2020-08-11 MED ORDER — IOHEXOL 350 MG/ML SOLN
100.0000 mL | Freq: Once | INTRAVENOUS | Status: AC | PRN
  Administered 2020-08-11: 100 mL via INTRAVENOUS

## 2020-08-11 MED ORDER — ALBUTEROL SULFATE HFA 108 (90 BASE) MCG/ACT IN AERS
4.0000 | INHALATION_SPRAY | Freq: Once | RESPIRATORY_TRACT | Status: AC
  Administered 2020-08-11: 4 via RESPIRATORY_TRACT
  Filled 2020-08-11: qty 6.7

## 2020-08-11 MED ORDER — POTASSIUM CHLORIDE 10 MEQ/100ML IV SOLN
10.0000 meq | INTRAVENOUS | Status: AC
  Administered 2020-08-11 (×2): 10 meq via INTRAVENOUS
  Filled 2020-08-11: qty 50
  Filled 2020-08-11 (×2): qty 100
  Filled 2020-08-11 (×3): qty 50
  Filled 2020-08-11: qty 100

## 2020-08-11 MED ORDER — NITROGLYCERIN 2 % TD OINT
0.5000 [in_us] | TOPICAL_OINTMENT | Freq: Once | TRANSDERMAL | Status: AC
  Administered 2020-08-11: 0.5 [in_us] via TOPICAL
  Filled 2020-08-11: qty 1

## 2020-08-11 MED ORDER — FLUTICASONE PROPIONATE 50 MCG/ACT NA SUSP
1.0000 | Freq: Every day | NASAL | Status: DC
  Administered 2020-08-11 – 2020-08-15 (×5): 1 via NASAL
  Filled 2020-08-11: qty 16

## 2020-08-11 MED ORDER — CHLORHEXIDINE GLUCONATE CLOTH 2 % EX PADS
6.0000 | MEDICATED_PAD | Freq: Every day | CUTANEOUS | Status: DC
  Administered 2020-08-11 – 2020-08-15 (×5): 6 via TOPICAL

## 2020-08-11 MED ORDER — WARFARIN SODIUM 5 MG PO TABS: 5.0000 mg | ORAL_TABLET | ORAL | Status: DC

## 2020-08-11 MED ORDER — ALBUTEROL (5 MG/ML) CONTINUOUS INHALATION SOLN
10.0000 mg/h | INHALATION_SOLUTION | Freq: Once | RESPIRATORY_TRACT | Status: AC
  Administered 2020-08-11: 10 mg/h via RESPIRATORY_TRACT

## 2020-08-11 MED ORDER — SACUBITRIL-VALSARTAN 24-26 MG PO TABS
1.0000 | ORAL_TABLET | Freq: Two times a day (BID) | ORAL | Status: DC
  Administered 2020-08-11 – 2020-08-15 (×8): 1 via ORAL
  Filled 2020-08-11 (×8): qty 1

## 2020-08-11 MED ORDER — POTASSIUM CHLORIDE 10 MEQ/100ML IV SOLN
INTRAVENOUS | Status: AC
  Administered 2020-08-11: 10 meq via INTRAVENOUS
  Filled 2020-08-11: qty 100

## 2020-08-11 NOTE — Progress Notes (Signed)
Received no orders and no communication from midlevel. Will page hospitalist

## 2020-08-11 NOTE — Progress Notes (Signed)
Brief Note. -Admitted earlier today with flash pulmonary edema, with associated chest and back pain afterwards. 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 -SOB is improving with diuresis -Chest and back pain have resolved significantly. -Denies history of DM -No other constitutional symptoms reported.  GC - obese, awake and alert HEENT: No pallor, no jaundice NECK - Supple Lungs - Clear to auscultation CVS - S1S2 Neuro - Awake and alert. Patient moves all extremities Abd - Obese, soft and non tender. Organs are difficult to assess Extremities - Fullness of the ankle.  Plan: Continue diuresis Consult Cardiology Monitor and correct electrolytes. Potassium of 3.3 noted.

## 2020-08-11 NOTE — Progress Notes (Signed)
Pt is alert and oriented c/o back pain (chronic). Administered PRN norco. She is still complaining of being unable to take a deep breath due to pain. She requested something for anxiety/nerves tonight in case of bipap use. Paged midlevel for possible orders.

## 2020-08-11 NOTE — ED Notes (Signed)
Date and time results received: 08/11/20 0525 (use smartphrase ".now" to insert current time)  Test: lactic acid Critical Value: 3.7  Name of Provider Notified: Dr Lynelle Doctor  Orders Received? Or Actions Taken?: Actions Taken: no orders received

## 2020-08-11 NOTE — ED Provider Notes (Signed)
[facility] EMERGENCY DEPARTMENT Provider Note   CSN: [id number] Arrival date & time: 08/11/20  0215   Time seen 3:10 AM  History Chief Complaint  Patient presents with  . Shortness of Breath    [name] [name] is a 59 y.o. female.  HPI Patient has a history of anticardiolipin syndrome and is on Coumadin which gets checked on a regular basis.  Her last INR was done on July 21 and was 2.2.  Patient has a history of congestive heart failure.  She states last night she ate 2 pieces of cheese pizza.  She woke up in the middle the night and felt very short of breath.  She is not on oxygen at home.  She states she had some swelling in her right leg last week and took a extra dose of Lasix that it seemed to go away.  She states her weight has been very variable over the past 2 weeks and is 10 pounds.  She states she is anywhere from 220 up to 230.  She states last night she was 230 pounds.  She states in her appointment last week she was 226 pound.  She states she had some abdominal bloating and feels like she has pressing underneath her diaphragm tonight.  She states she has a cough from allergies that is clear or white mucus production from sinus drainage.  She states the neighbors just cut hay field and she is allergic to hay.  She denies fever, sore throat, loss of taste or smell.  She has clear rhinorrhea without sneezing.  She states she felt nauseated this evening but did not have vomiting.  Patient continues to smoke and smokes a half a pack a day.  She states tonight she feels like there may be some wheezing in the right side of her lung but not bad.  Patient states she had the Covid moderna vaccine.   PCP Ignatius Specking, MD Cardiology Dr. Diona Browner, Dr. Ladona Ridgel for her ICD  Past Medical History:  Diagnosis Date  . Aneurysm of internal carotid artery   . Anticardiolipin antibody positive    Chronic Coumadin  . Chronic combined systolic (congestive) and diastolic (congestive) heart  failure (HCC)   . Cocaine abuse in remission (HCC)   . Coronary atherosclerosis of native coronary artery    Previous invasive cardiac testing was done at a facility in [place], [place].  Occluded diagonal, Diffuse LAD disease, Occluded Cx, and non obstructive RCA.   . Essential hypertension   . Headache   . History of pneumonia   . Ischemic cardiomyopathy    LVEF 30-35%  . Mixed hyperlipidemia   . PVD (peripheral vascular disease) (HCC)   . Raynaud's phenomenon   . Stroke [facility]) 2007   Total occlusion of the right internal carotid    Patient Active Problem List   Diagnosis Date Noted  . SIRS (systemic inflammatory response syndrome) (HCC) 01/06/2019  . HCAP (healthcare-associated pneumonia) 12/31/2018  . H. influenzae infection 12/24/2018  . Influenza A 12/24/2018  . Chest pain 06/18/2017  . Hyponatremia 06/18/2017  . Tobacco dependence 06/18/2017  . 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 Left-sided weakness   . Chronic combined systolic (congestive) and diastolic (congestive) heart failure (HCC) 07/26/2015  . Encounter for therapeutic drug monitoring 02/02/2014  . Peripheral arterial disease (HCC) 11/09/2013  . Dual implantable cardioverter-defibrillator in situ 06/01/2012  . Long term current use of anticoagulant  03/22/2011  . Essential hypertension, benign 05/29/2010  . CHEST PAIN 05/29/2010  . Hyperlipidemia 09/26/2009  . Peripheral vascular disease (HCC) 06/23/2009  . Cardiomyopathy, ischemic 11/29/2008  . CEREBROVASCULAR DISEASE 11/29/2008  . Anticardiolipin syndrome (HCC) 11/29/2008    Past Surgical History:  Procedure Laterality Date  . ABDOMINAL AORTAGRAM N/A 05/25/2014   Procedure: ABDOMINAL Ronny Flurry;  Surgeon: Iran Ouch, MD;  Location: [facility] CATH LAB;  Service: Cardiovascular;  Laterality: N/A;  . APPENDECTOMY    . CARDIAC DEFIBRILLATOR PLACEMENT     [employer] ICD  . EP IMPLANTABLE DEVICE  N/A 03/01/2016   Procedure: ICD Generator Changeout;  Surgeon: [name], MD;  Location: [facility] INVASIVE CV LAB;  Service: Cardiovascular;  Laterality: N/A;  . L1 corpectomy   12/10  . Laryngeal polyp excision       OB History   No obstetric history on file.     Family History  Problem Relation Age of Onset  . Heart disease Father 20  . Ankylosing spondylitis Sister   . Stomach cancer Brother 57  . Heart attack Brother     Social History   Tobacco Use  . Smoking status: Current Every Day Smoker    Packs/day: 1.00    Years: 43.00    Pack years: 43.00    Types: Cigarettes    Start date: 03/03/1978    Last attempt to quit: 10/03/2013    Years since quitting: 6.8  . Smokeless tobacco: Never Used  . Tobacco comment: resumed cigarettes, stopped last week but continues to vape  Vaping Use  . Vaping Use: Every day  Substance Use Topics  . Alcohol use: No    Alcohol/week: 0.0 standard drinks  . Drug use: No    Comment: Prior history of cocaine    Home Medications Prior to Admission medications   Medication Sig Start Date End Date Taking? Authorizing Provider  carvedilol (COREG) 12.5 MG tablet TAKE 1 TABLET BY MOUTH TWICE DAILY WITH MEALS 06/21/20   [name], MD  DULoxetine (CYMBALTA) 60 MG capsule Take 60 mg by mouth every morning.    [provider]  famotidine (PEPCID) 20 MG tablet Take 1 tablet (20 mg total) by mouth 2 (two) times daily. Patient taking differently: Take 20 mg by mouth daily.  01/06/19 04/04/20  Kari Baars, MD  fluticasone (FLONASE) 50 MCG/ACT nasal spray Place 1 spray into both nostrils 2 (two) times daily. 09/24/19   [provider]  furosemide (LASIX) 40 MG tablet TAKE 1 TABLET BY MOUTH DAILY AS NEEDED FOR SWELLING. 04/17/20   [name], MD  furosemide (LASIX) 80 MG tablet TAKE 1 TABLET BY MOUTH DAILY (takes 80 AND 40 MG) 04/17/20   [name], MD  gabapentin (NEURONTIN) 300 MG capsule Take 300 mg by mouth 2 (two)  times daily.    [provider]  guaiFENesin (MUCINEX) 600 MG 12 hr tablet Take 2 tablets (1,200 mg total) by mouth 2 (two) times daily. 01/06/19   Kari Baars, MD  methocarbamol (ROBAXIN) 750 MG tablet Take 750 mg by mouth every 8 (eight) hours as needed for muscle spasms.    [provider]  Multiple Vitamins-Minerals (MULTIVITAMINS THER. W/MINERALS) TABS Take 1 tablet by mouth daily.      [provider]  nitroGLYCERIN (NITROSTAT) 0.4 MG SL tablet PLACE 1 TABLET UNDER THE TONGUE EVERY 5 MINUTES FOR 3 DOSES AS NEEDED CHEST PAIN 05/04/19   [name], MD  oxyCODONE-acetaminophen (PERCOCET) 10-325 MG tablet  Take 1 tablet by mouth 4 (four) times daily as needed. 09/14/19   [provider]  potassium chloride SA (KLOR-CON) 20 MEQ tablet TAKE 1 TABLET BY MOUTH EVERY DAY 08/10/20   [name], MD  promethazine (PHENERGAN) 25 MG tablet Take 25 mg by mouth every 6 (six) hours as needed for nausea.    [provider]  sacubitril-valsartan (ENTRESTO) 24-26 MG Take 1 tablet by mouth 2 (two) times daily. 08/07/20   [name], MD  spironolactone (ALDACTONE) 25 MG tablet TAKE 1 TABLET BY MOUTH EVERY DAY 08/10/20   [name], MD  warfarin (COUMADIN) 10 MG tablet TAKE 1 TABLET BY MOUTH EVERY DAY EXCEPT 1/2 TABLET ON MONDAYS AND THURSDAYS 06/06/20   [name], MD    Allergies    Metoclopramide hcl, Penicillins, Metoprolol, Reglan [metoclopramide], and Statins  Review of Systems   Review of Systems  All other systems reviewed and are negative.   Physical Exam Updated Vital Signs BP (!) 163/112   Pulse (!) 130   Temp 98.4 F (36.9 C) (Oral)   Resp (!) 27   Ht 5\' 5"  (1.651 m)   Wt 104.3 kg   SpO2 100%   BMI 38.27 kg/m   Physical Exam Vitals and nursing note reviewed.  Constitutional:      General: She is not in acute distress.    Appearance: Normal appearance. She is obese. She is not ill-appearing.     Comments:  Patient is able to talk in complete sentences.  HENT:     Head: Normocephalic and atraumatic.     Right Ear: External ear normal.     Left Ear: External ear normal.  Eyes:     Extraocular Movements: Extraocular movements intact.     Conjunctiva/sclera: Conjunctivae normal.  Cardiovascular:     Rate and Rhythm: Normal rate. Rhythm irregular.     Pulses: Normal pulses.     Heart sounds: Normal heart sounds. No murmur heard.   Pulmonary:     Effort: Pulmonary effort is normal. No respiratory distress.     Breath sounds: Normal breath sounds.     Comments: Patient is noted to have diminished breath sounds in the right base Abdominal:     General: There is no distension.     Palpations: Abdomen is soft.     Tenderness: There is no abdominal tenderness.  Musculoskeletal:     Cervical back: Normal range of motion.     Right lower leg: No edema.     Left lower leg: No edema.  Skin:    General: Skin is warm and dry.  Neurological:     General: No focal deficit present.     Mental Status: She is alert and oriented to person, place, and time.     Cranial Nerves: No cranial nerve deficit.  Psychiatric:        Mood and Affect: Mood normal.        Behavior: Behavior normal.        Thought Content: Thought content normal.     ED Results / Procedures / Treatments   Labs (all labs ordered are listed, but only abnormal results are displayed) Results for orders placed or performed during the hospital encounter of 08/11/20  SARS Coronavirus 2 by RT PCR (hospital order, performed in [facility] hospital lab) Nasopharyngeal Nasopharyngeal Swab   Specimen: Nasopharyngeal Swab  Result Value Ref Range   SARS Coronavirus 2 NEGATIVE NEGATIVE  Comprehensive metabolic panel  Result Value Ref Range   Sodium 134 (L) 135 - 145 mmol/L   Potassium 3.3 (L) 3.5 - 5.1 mmol/L   Chloride 97 (L) 98 - 111 mmol/L   CO2 26 22 - 32 mmol/L   Glucose, Bld 87 70 - 99 mg/dL   BUN 13 6 - 20 mg/dL   Creatinine,  Ser 4.09 0.44 - 1.00 mg/dL   Calcium 9.0 8.9 - 81.1 mg/dL   Total Protein 7.3 6.5 - 8.1 g/dL   Albumin 3.7 3.5 - 5.0 g/dL   AST 19 15 - 41 U/L   ALT 18 0 - 44 U/L   Alkaline Phosphatase 56 38 - 126 U/L   Total Bilirubin 0.5 0.3 - 1.2 mg/dL   GFR calc non Af Amer >60 >60 mL/min   GFR calc Af Amer >60 >60 mL/min   Anion gap 11 5 - 15  CBC with Differential  Result Value Ref Range   WBC 11.0 (H) 4.0 - 10.5 K/uL   RBC 4.76 3.87 - 5.11 MIL/uL   Hemoglobin 14.1 12.0 - 15.0 g/dL   HCT 91.4 36 - 46 %   MCV 92.2 80.0 - 100.0 fL   MCH 29.6 26.0 - 34.0 pg   MCHC 32.1 30.0 - 36.0 g/dL   RDW 78.2 95.6 - 21.3 %   Platelets 123 (L) 150 - 400 K/uL   nRBC 0.0 0.0 - 0.2 %   Neutrophils Relative % 74 %   Neutro Abs 8.2 (H) 1.7 - 7.7 K/uL   Lymphocytes Relative 16 %   Lymphs Abs 1.7 0.7 - 4.0 K/uL   Monocytes Relative 5 %   Monocytes Absolute 0.6 0 - 1 K/uL   Eosinophils Relative 3 %   Eosinophils Absolute 0.4 0 - 0 K/uL   Basophils Relative 1 %   Basophils Absolute 0.1 0 - 0 K/uL   Immature Granulocytes 1 %   Abs Immature Granulocytes 0.05 0.00 - 0.07 K/uL  Brain natriuretic peptide  Result Value Ref Range   B Natriuretic Peptide 571.0 (H) 0.0 - 100.0 pg/mL  Protime-INR  Result Value Ref Range   Prothrombin Time 22.4 (H) 11.4 - 15.2 seconds   INR 2.1 (H) 0.8 - 1.2  Troponin I (High Sensitivity)  Result Value Ref Range   Troponin I (High Sensitivity) 13 <18 ng/L   Laboratory interpretation all normal except lactic acidosis, leukocytosis, hypokalemia mild elevation of BNP, therapeutic INR at 2.1    EKG EKG Interpretation  Date/Time:  Friday August 11 2020 02:25:00 EDT Ventricular Rate:  108 PR Interval:    QRS Duration: 105 QT Interval:  343 QTC Calculation: 460 R Axis:   60 Text Interpretation: Sinus tachycardia Paired ventricular premature complexes Low voltage, precordial leads Anteroseptal infarct, old Abnormal T, consider ischemia, lateral leads Since last tracing rate  faster 01 Jan 2019 Confirmed by Devoria Albe ([id number]) on 08/11/2020 4:48:32 AM   ED ECG REPORT   Date: 08/11/2020  Rate: 108  Rhythm: sinus tachycardia and premature ventricular contractions (PVC)  QRS Axis: normal  Intervals: normal  ST/T Wave abnormalities: nonspecific T wave changes  Conduction Disutrbances:Q waves anteriorly, low voltage chest leads  Narrative Interpretation:   Old EKG Reviewed: none available  I have personally reviewed the EKG tracing and agree with the computerized printout as noted.     #2 EKG   EKG Interpretation  Date/Time:  Friday August 11 2020 03:41:58 EDT Ventricular Rate:  129 PR Interval:    QRS Duration: 116 QT  Interval:  308 QTC Calculation: 415 R Axis:   60 Text Interpretation: Sinus tachycardia Ventricular tachycardia, unsustained Nonspecific intraventricular conduction delay Low voltage, extremity and precordial leads Anteroseptal infarct, old Abnormal T, consider ischemia, diffuse leads Baseline wander in lead(s) II III aVF No significant change since last tracing EARLIER SAME DATE Confirmed by Devoria Albe ([id number]) on 08/11/2020 4:49:48 AM        ED ECG REPORT   Date: 08/11/2020  Rate: 129  Rhythm: sinus tachycardia and premature ventricular contractions (PVC)  QRS Axis: normal  Intervals: normal  ST/T Wave abnormalities: nonspecific T wave changes  Conduction Disutrbances:Q waves anteriorly, low voltage  Narrative Interpretation: Baseline wander  Old EKG Reviewed: unchanged  I have personally reviewed the EKG tracing and agree with the computerized printout as noted.     Radiology DG Chest Port 1 View  Result Date: 08/11/2020 CLINICAL DATA:  59 year old female with shortness of breath, left side chest pain. COVID-19 status pending. Smoker. EXAM: PORTABLE CHEST 1 VIEW COMPARISON:  Chest CT 12/31/2018 and earlier. FINDINGS: Portable AP upright view at 0237 hours. Lung volumes and mediastinal contours are stable since last year.  Stable left chest cardiac AICD. Visualized tracheal air column is within normal limits. No pneumothorax or pleural effusion. Coarse bilateral perihilar opacity appears chronic, and largely stable since January of 2020. No definite acute pulmonary opacity. Partially visible chronic lumbar fusion hardware. IMPRESSION: Radiographic appearance of the chest is stable since January 2020. No acute cardiopulmonary abnormality. Electronically Signed   By: Odessa Fleming M.D.   On: 08/11/2020 03:12   DG Chest Port 1V same Day  Result Date: 08/11/2020 CLINICAL DATA:  Shortness of breath with sudden progression. COVID test is negative. EXAM: PORTABLE CHEST 1 VIEW COMPARISON:  One-view chest x-ray 08/11/2020 FINDINGS: The heart is enlarged. Diffuse interstitial pattern has increased. Aortic atherosclerosis is present. A left pleural effusion is noted. Pacing wires are stable. IMPRESSION: 1. Cardiomegaly with increasing interstitial pattern compatible with interstitial edema. 2. Left pleural effusion. Electronically Signed   By: Marin Roberts M.D.   On: 08/11/2020 04:09     Echocardiogram January 06, 2020 1. Left ventricular ejection fraction, by visual estimation, is 25 to  30%. The left ventricle has severely decreased function. There is no left  ventricular hypertrophy.  2. Elevated left atrial pressure.  3. Left ventricular diastolic parameters are consistent with Grade I  diastolic dysfunction (impaired relaxation).  4. Severely dilated left ventricular internal cavity size.  5. The left ventricle demonstrates global hypokinesis.  6. Global right ventricle has normal systolic function.The right  ventricular size is normal. No increase in right ventricular wall  thickness.  7. Left atrial size was severely dilated.  8. Right atrial size was normal.  9. The mitral valve is normal in structure. Moderate mitral valve  regurgitation. No evidence of mitral stenosis.  10. The tricuspid valve is normal  in structure.  11. The aortic valve has an indeterminant number of cusps. Aortic valve  regurgitation is not visualized. No evidence of aortic valve sclerosis or  stenosis.  12. The pulmonic valve was not well visualized. Pulmonic valve  regurgitation is not visualized.  13. Normal pulmonary artery systolic pressure.  14. A pacer wire is visualized.  15. The inferior vena cava is normal in size with greater than 50%  respiratory variability, suggesting right atrial pressure of 3 mmHg.   Procedures .Critical Care Performed by: Devoria Albe, MD Authorized by: Devoria Albe, MD   Critical care provider  statement:    Critical care time (minutes):  40   Critical care was necessary to treat or prevent imminent or life-threatening deterioration of the following conditions:  Respiratory failure   Critical care was time spent personally by me on the following activities:  Discussions with consultants, examination of patient, obtaining history from patient or surrogate, ordering and review of laboratory studies, ordering and review of radiographic studies, pulse oximetry, re-evaluation of patient's condition and review of old charts   (including critical care time)  Medications Ordered in ED Medications  albuterol (VENTOLIN) (5 MG/ML) 0.5% continuous inhalation solution (  Canceled Entry 08/11/20 0413)  iohexol (OMNIPAQUE) 350 MG/ML injection 100 mL (has no administration in time range)  hydrOXYzine (ATARAX/VISTARIL) tablet 25 mg (25 mg Oral Given 08/11/20 0337)  furosemide (LASIX) injection 60 mg (60 mg Intravenous Given 08/11/20 0338)  albuterol (VENTOLIN HFA) 108 (90 Base) MCG/ACT inhaler 4 puff (4 puffs Inhalation Given 08/11/20 0336)  nitroGLYCERIN (NITROGLYN) 2 % ointment 0.5 inch (0.5 inches Topical Given 08/11/20 0336)  albuterol (PROVENTIL,VENTOLIN) solution continuous neb (10 mg/hr Nebulization Given 08/11/20 0413)    ED Course  I have reviewed the triage vital signs and the nursing  notes.  Pertinent labs & imaging results that were available during my care of the patient were reviewed by me and considered in my medical decision making (see chart for details).    MDM Rules/Calculators/A&P                          Patient is requesting something for her "nerves".  She states she was told her nephew could not come back due to Covid.  I told her I would give her hydroxyzine.  Review of the [place] shows patient gets hydrocodone 10/325 mg monthly from her primary care doctor.  They were last filled on July 30 for 120 tablets for 30 days.  Prior to that she was getting 115 tablets for 29 days.  Nurse reports her pulse ox on room air was 90 to 91%.  She was placed on nasal cannula oxygen and it improved  3:40 AM patient started complaining of chest pain, she states it hurts bilaterally anteriorly and posteriorly.  She also is complaining of more shortness of breath and states "I am dying". On  Nasal cannula her oxygen dropped down to 86%.  She was placed on a nonrebreather.  I suspect patient may be having a PE which she states she has not had before.  However when I went in the room and listened to her she now has diffuse rales.  She is coughing up foamy pink-tinged sputum.  She was placed on BiPAP and she remained hypoxic for a short period of time and her oxygen slowly started improving up to 99% however she was on 100% FiO2.  Her respiratory rate was in the 33-45 range.  Patient is very diaphoretic.  She had already just gotten the Lasix and 1/2 inch of Nitropaste prior to this event.  She had a negative Covid test so she was given albuterol 10 mg by nebulizer through the BiPAP.  She indicates her chest pain is gone.  Recheck at 4:20 AM patient is resting comfortably.  When I listen to her now I do not hear any more rales.  Her respiratory rate has come down into the mid 20s.  Her oxygen now is 100%.  Patient is able to speak now in full sentences and states she  is  feeling better.  We discussed that she would need to be admitted and she understands.  Recheck at 4:50 AM patient states she is sleepy.  She was advised to go ahead and go to sleep the BiPAP would agree for her.  Her VBG has resulted and she does not have a respiratory acidosis.  I am waiting on the hospitalist to call back so she can be admitted.  She has not had urinary output yet from her Lasix.  5:30 AM patient was discussed with Dr. Thomes Dinning, hospitalist, he will come see patient for admission  Final Clinical Impression(s) / ED Diagnoses Final diagnoses:  Acute on chronic congestive heart failure, unspecified heart failure type (HCC)  Hypoxia    Rx / DC Orders  Plan admission  Devoria Albe, MD, Concha Pyo, MD 08/11/20 [phone]

## 2020-08-11 NOTE — ED Triage Notes (Signed)
EMS brought pt in for c/o SOB. Pt says she was sleeping and awoke with sob and pain to left rib cage under breast with deep breathing.

## 2020-08-11 NOTE — Progress Notes (Signed)
Patient was on BiPAP last night in er. She has been moved to ICU and is doing well on 3 liters. She states when she goes to sleep her mouth is open and fears she is not getting her oxygen. So she has been placed on dream station with oxygen bled in. Not sure to as why she needed BiPAP originally? Pulmonary edema ?? As she has very low ejection fraction ( heart) .Suspect this is more Placebo effect with BiPAP tonight than actual need.

## 2020-08-11 NOTE — H&P (Signed)
History and Physical  [name] [name] UJW:[id number] DOB: [birth year] DOA: 08/11/2020  Referring physician: Devoria Albe, MD PCP: Ignatius Specking, MD  Outpatient Specialists: Cardiology Diona Browner, Illene Bolus, MD) Patient coming from: Home  Chief Complaint: Shortness of breath  HPI: [name] [name] is a 59 y.o. female with medical history significant for hypertension, hyperlipidemia, anticardiolipin syndrome, chronic combined systolic and diastolic heart failure and tobacco abuse who presents to the emergency department due to shortness of breath that woke her up from sleep this morning.  Patient states that she had 2 cheese pizza around 2 PM yesterday (8/12) and did not have dinner prior to going to bed.  She suddenly woke up gasping for breath and with increased work of breathing, so she activated EMS and she was taken to the ED for further evaluation and management.  Patient states that she has noted variable weight changes within last 2 weeks with weight ranging from 220-230 pounds, however, she endorsed increased abdominal girth with increased leg swelling within last 4 days.  Patient also complained of cough which she thinks was due to history of allergy to hay (recently exposed to neighbors hay field).  She denies fever, chills,, nausea, vomiting or abdominal pain.  ED Course:  In the emergency department, she was tachycardic, tachypneic and O2 sat ranged within 90-91% on room air, supplemental oxygen via Point was initially given with O2 sat dropping to 86%, she was then placed on a nonrebreather, however patient was reported to present with coughing up foamy pink-tinged sputum, she was then placed on BiPAP with eventual improvement in O2 sats to 99-100%.  She was also noted to be diaphoretic.  IV Lasix 60 mg and 1/2 inch of Nitropaste was provided. Work-up in the ED, showed mild leukocytosis, hypokalemia, hyponatremia, BNP 571 (this was around 23 about a year ago) lactic acid 3.7, SARS coronavirus 2 was  negative.  Initial chest x-ray showed no acute cardiopulmonary abnormality.  Repeat chest x-ray showed cardiomegaly with increased interstitial pattern compatible with interstitial edema and left pleural effusion.  IV Lasix was given as described above, though  patient has not had any urinary output.  Breathing treatment with albuterol was also provided.  Hospitalist was asked to admit.  For further evaluation and management.  Review of Systems: Constitutional: Negative for chills and fever.  HENT: Negative for ear pain and sore throat.   Eyes: Negative for pain and visual disturbance.  Respiratory: Positive for shortness of breath.  Cardiovascular: Negative for chest pain and palpitations.  Gastrointestinal: Positive for increased abdominal girth.  Negative for abdominal pain and vomiting.  Endocrine: Negative for polyphagia and polyuria.  Genitourinary: Negative for decreased urine volume, dysuria, enuresis, hematuria, vaginal discharge and vaginal pain.  Musculoskeletal: Positive for increased leg swelling.  Negative for arthralgias and back pain.  Skin: Negative for color change and rash.  Allergic/Immunologic: Negative for immunocompromised state.  Neurological: Negative for tremors, syncope, speech difficulty, weakness, light-headedness and headaches.  Hematological: Does not bruise/bleed easily.  All other systems reviewed and are negative    Past Medical History:  Diagnosis Date  . Aneurysm of internal carotid artery   . Anticardiolipin antibody positive    Chronic Coumadin  . Chronic combined systolic (congestive) and diastolic (congestive) heart failure (HCC)   . Cocaine abuse in remission (HCC)   . Coronary atherosclerosis of native coronary artery    Previous invasive cardiac testing was done at a facility in [place], [place].  Occluded diagonal, Diffuse LAD disease,  Occluded Cx, and non obstructive RCA.   . Essential hypertension   . Headache   . History of pneumonia    . Ischemic cardiomyopathy    LVEF 30-35%  . Mixed hyperlipidemia   . PVD (peripheral vascular disease) (HCC)   . Raynaud's phenomenon   . Stroke [facility]) 2007   Total occlusion of the right internal carotid   Past Surgical History:  Procedure Laterality Date  . ABDOMINAL AORTAGRAM N/A 05/25/2014   Procedure: ABDOMINAL Ronny Flurry;  Surgeon: Iran Ouch, MD;  Location: [facility] CATH LAB;  Service: Cardiovascular;  Laterality: N/A;  . APPENDECTOMY    . CARDIAC DEFIBRILLATOR PLACEMENT     [employer] ICD  . EP IMPLANTABLE DEVICE N/A 03/01/2016   Procedure: ICD Generator Changeout;  Surgeon: [name], MD;  Location[facility] INV[facility] CV LAB;  Service: Cardiovascular;  Laterality: N/A;  . L1 corpectomy   12/10  . Laryngeal polyp excision      Social History:  reports that she has been smoking cigarettes. She started smoking about 42 years ago. She has a 43.00 pack-year smoking history. She has never used smokeless tobacco. She reports that she does not drink alcohol and does not use drugs.   Allergies  Allergen Reactions  . Metoclopramide Hcl Anaphylaxis    Non responsive  . Penicillins Anaphylaxis    Pt states she can take any cephalosporin (per pt. 01/01/19) Shock Has patient had a PCN reaction causing immediate rash, facial/tongue/throat swelling, SOB or lightheadedness with hypotension: Yes Has patient had a PCN reaction causing severe rash involving mucus membranes or skin necrosis: No Has patient had a PCN reaction that required hospitalization Yes Has patient had a PCN reaction occurring within the last 10 years: No If all of the above answers are "NO", then may proceed with Cephalosporin   . Metoprolol Other (See Comments)    Syncope   . Reglan [Metoclopramide]   . Statins Other (See Comments)    Myalgias    Family History  Problem Relation Age of Onset  . Heart disease Father 71  . Ankylosing spondylitis Sister   . Stomach cancer Brother 60  . Heart attack Brother       Prior to Admission medications   Medication Sig Start Date End Date Taking? Authorizing Provider  carvedilol (COREG) 12.5 MG tablet TAKE 1 TABLET BY MOUTH TWICE DAILY WITH MEALS 06/21/20   [name], MD  DULoxetine (CYMBALTA) 60 MG capsule Take 60 mg by mouth every morning.    [provider]  famotidine (PEPCID) 20 MG tablet Take 1 tablet (20 mg total) by mouth 2 (two) times daily. Patient taking differently: Take 20 mg by mouth daily.  01/06/19 04/04/20  Kari Baars, MD  fluticasone (FLONASE) 50 MCG/ACT nasal spray Place 1 spray into both nostrils 2 (two) times daily. 09/24/19   [provider]  furosemide (LASIX) 40 MG tablet TAKE 1 TABLET BY MOUTH DAILY AS NEEDED FOR SWELLING. 04/17/20   [name], MD  furosemide (LASIX) 80 MG tablet TAKE 1 TABLET BY MOUTH DAILY (takes 80 AND 40 MG) 04/17/20   [name], MD  gabapentin (NEURONTIN) 300 MG capsule Take 300 mg by mouth 2 (two) times daily.    [provider]  guaiFENesin (MUCINEX) 600 MG 12 hr tablet Take 2 tablets (1,200 mg total) by mouth 2 (two) times daily. 01/06/19   Kari Baars, MD  methocarbamol (ROBAXIN) 750 MG tablet Take 750 mg by mouth every 8 (eight) hours  as needed for muscle spasms.    [provider]  Multiple Vitamins-Minerals (MULTIVITAMINS THER. W/MINERALS) TABS Take 1 tablet by mouth daily.      [provider]  nitroGLYCERIN (NITROSTAT) 0.4 MG SL tablet PLACE 1 TABLET UNDER THE TONGUE EVERY 5 MINUTES FOR 3 DOSES AS NEEDED CHEST PAIN 05/04/19   [name], MD  oxyCODONE-acetaminophen (PERCOCET) 10-325 MG tablet Take 1 tablet by mouth 4 (four) times daily as needed. 09/14/19   [provider]  potassium chloride SA (KLOR-CON) 20 MEQ tablet TAKE 1 TABLET BY MOUTH EVERY DAY 08/10/20   [name], MD  promethazine (PHENERGAN) 25 MG tablet Take 25 mg by mouth every 6 (six) hours as needed for nausea.    [provider]   sacubitril-valsartan (ENTRESTO) 24-26 MG Take 1 tablet by mouth 2 (two) times daily. 08/07/20   [name], MD  spironolactone (ALDACTONE) 25 MG tablet TAKE 1 TABLET BY MOUTH EVERY DAY 08/10/20   [name], MD  warfarin (COUMADIN) 10 MG tablet TAKE 1 TABLET BY MOUTH EVERY DAY EXCEPT 1/2 TABLET ON MONDAYS AND THURSDAYS 06/06/20   [name], MD    Physical Exam: BP 122/75   Pulse (!) 118   Temp 98.4 F (36.9 C) (Oral)   Resp (!) 21   Ht 5\' 5"  (1.651 m)   Wt 104.3 kg   SpO2 99%   BMI 38.27 kg/m   . General: 59 y.o. year-old female obese in no acute distress.  Alert and oriented x3. 58 HEENT: NCAT, EOMI . Neck: Supple, trachea medial . Cardiovascular: Tachycardia.  Regular rate and rhythm with no rubs or gallops.  2/4 pulses in all 4 extremities. Marland Kitchen Respiratory: Tachypnea.  Scattered rales on auscultation.  Patient on BiPAP without increased work of breathing . Abdomen: Soft nontender nondistended with normal bowel sounds x4 quadrants. . Muskuloskeletal: No cyanosis, clubbing or edema noted bilaterally . Neuro: CN II-XII intact, strength, sensation, reflexes . Skin: No ulcerative lesions noted or rashes . Psychiatry: Judgement and insight appear normal. Mood is appropriate for condition and setting          Labs on Admission:  Basic Metabolic Panel: Recent Labs  Lab 08/11/20 0230 08/11/20 0432  NA 134*  --   K 3.3*  --   CL 97*  --   CO2 26  --   GLUCOSE 87  --   BUN 13  --   CREATININE 0.62  --   CALCIUM 9.0  --   MG  --  2.0  PHOS  --  4.3   Liver Function Tests: Recent Labs  Lab 08/11/20 0230  AST 19  ALT 18  ALKPHOS 56  BILITOT 0.5  PROT 7.3  ALBUMIN 3.7   No results for input(s): LIPASE, AMYLASE in the last 168 hours. No results for input(s): AMMONIA in the last 168 hours. CBC: Recent Labs  Lab 08/11/20 0230  WBC 11.0*  NEUTROABS 8.2*  HGB 14.1  HCT 43.9  MCV 92.2  PLT 123*   Cardiac Enzymes: No results for input(s):  CKTOTAL, CKMB, CKMBINDEX, TROPONINI in the last 168 hours.  BNP (last 3 results) Recent Labs    08/11/20 0230  BNP 571.0*    ProBNP (last 3 results) No results for input(s): PROBNP in the last 8760 hours.  CBG: No results for input(s): GLUCAP in the last 168 hours.  Radiological Exams on Admission: DG Chest Port 1 View  Result Date: 08/11/2020 CLINICAL DATA:  59 year old female with shortness of breath, left side chest pain. COVID-19 status pending. Smoker. EXAM: PORTABLE CHEST 1 VIEW COMPARISON:  Chest CT 12/31/2018 and earlier. FINDINGS: Portable AP upright view at 0237 hours. Lung volumes and mediastinal contours are stable since last year. Stable left chest cardiac AICD. Visualized tracheal air column is within normal limits. No pneumothorax or pleural effusion. Coarse bilateral perihilar opacity appears chronic, and largely stable since January of 2020. No definite acute pulmonary opacity. Partially visible chronic lumbar fusion hardware. IMPRESSION: Radiographic appearance of the chest is stable since January 2020. No acute cardiopulmonary abnormality. Electronically Signed   By: Odessa Fleming M.D.   On: 08/11/2020 03:12   DG Chest Port 1V same Day  Result Date: 08/11/2020 CLINICAL DATA:  Shortness of breath with sudden progression. COVID test is negative. EXAM: PORTABLE CHEST 1 VIEW COMPARISON:  One-view chest x-ray 08/11/2020 FINDINGS: The heart is enlarged. Diffuse interstitial pattern has increased. Aortic atherosclerosis is present. A left pleural effusion is noted. Pacing wires are stable. IMPRESSION: 1. Cardiomegaly with increasing interstitial pattern compatible with interstitial edema. 2. Left pleural effusion. Electronically Signed   By: Marin Roberts M.D.   On: 08/11/2020 04:09    EKG: I independently viewed the EKG done and my findings are as followed: Sinus tachycardia at a rate of 108 bpm with PVCs  Assessment/Plan Present on Admission: . Acute respiratory failure  with hypoxia (HCC) . Chronic combined systolic (congestive) and diastolic (congestive) heart failure (HCC) . Essential hypertension, benign . Hyperlipidemia . Anticardiolipin syndrome (HCC) . Dual implantable cardioverter-defibrillator in situ . Hyponatremia  Principal Problem:   Acute respiratory failure with hypoxia (HCC) Active Problems:   Hyperlipidemia   Essential hypertension, benign   Anticardiolipin syndrome (HCC)   Dual implantable cardioverter-defibrillator in situ   Chronic combined systolic (congestive) and diastolic (congestive) heart failure (HCC)   Hyponatremia   Hypokalemia   Acute respiratory failure with hypoxia requiring NIPPV due to possible acute on chronic CHF rule out acute PE Patient had orthopnea at home with persistent increased work of breathing.  He was also hypoxic in the ED. Initial chest x-ray showed no acute cardiopulmonary disease, but a repeat chest x-ray showed cardiomegaly with increased interstitial pattern compatible with interstitial edema and left pleural effusion. proBNP 571, this was 523 about a year ago IV Lasix at 60 mg was given with no urinary output; kidney function was normal, patient without peripheral edema. Continue total input/output, daily weights and fluid restriction Continue IV Lasix 40 mg twice daily Continue Cardiac diet  Echocardiogram done on 01/06/2020 showed LVEF of 25 to 30%, left ventricle with severely decreased function. Echocardiogram in the morning  Continue home Entresto Patient is currently unstable to undergo CT angiography of chest at this time due to orthopnea and respiratory distress when she lays flat.  D-dimer will be checked with plan to do CT angiography based on D-Dimer when patient is more stable (of note, patient is on warfarin, so chances of PE is slim, though it is still a differential)  Lactic acidosis possibly secondary to above-resolved Lactic acid 3.7 > 1.6  Electrolyte abnormalities (hyponatremia,  hypokalemia) Na 134, K+ 3.3 These may be due to diuretic effect Continue to monitor sodium level and replenish potassium   Leukocytosis possibly reactive WBC 11.0, no acute source of infection at this time Continue to monitor WBC with morning labs  Anticardiolipin syndrome Patient is on warfarin, INR 2.1 Continue warfarin and continue to monitor INR  Essential hypertension Continue IV  Lasix and home meds as indicated above for CHF  Hyperlipidemia Statin was not noted on patient's home med rec.  We shall await updated med rec  GERD Continue famotidine  Allergic rhinitis Continue Flonase and Mucinex  DVT prophylaxis: Warfarin  Code Status: Full code  Family Communication: None at bedside  Disposition Plan: Patient is from:                        home Anticipated DC to:                   SNF or family members home Anticipated DC date:               2-3 days Anticipated DC barriers:         Patient unstable to discharge at this time due to acute respiratory failure with hypoxia requiring NIPPV and possible acute on chronic CHF   Consults called: None  Admission status: Inpatient    Frankey Shown MD [facility]  If 7PM-7AM, please contact night-coverage [email or web address] Password [facility]  08/11/2020, 6:57 AM

## 2020-08-11 NOTE — Progress Notes (Signed)
*  PRELIMINARY RESULTS* Echocardiogram 2D Echocardiogram has been performed.  Jeryl Columbia 08/11/2020, 2:58 PM

## 2020-08-12 LAB — MRSA PCR SCREENING: MRSA by PCR: POSITIVE — AB

## 2020-08-12 LAB — COMPREHENSIVE METABOLIC PANEL
ALT: 21 U/L (ref 0–44)
AST: 18 U/L (ref 15–41)
Albumin: 3.2 g/dL — ABNORMAL LOW (ref 3.5–5.0)
Alkaline Phosphatase: 45 U/L (ref 38–126)
Anion gap: 9 (ref 5–15)
BUN: 10 mg/dL (ref 6–20)
CO2: 21 mmol/L — ABNORMAL LOW (ref 22–32)
Calcium: 8.5 mg/dL — ABNORMAL LOW (ref 8.9–10.3)
Chloride: 106 mmol/L (ref 98–111)
Creatinine, Ser: 0.5 mg/dL (ref 0.44–1.00)
GFR calc Af Amer: >60 mL/min (ref >60)
GFR calc non Af Amer: >60 mL/min (ref >60)
Glucose, Bld: 96 mg/dL (ref 70–99)
Potassium: 4 mmol/L (ref 3.5–5.1)
Sodium: 136 mmol/L (ref 135–145)
Total Bilirubin: 0.8 mg/dL (ref 0.3–1.2)
Total Protein: 6.4 g/dL — ABNORMAL LOW (ref 6.5–8.1)

## 2020-08-12 LAB — CBC
HCT: 37.1 % (ref 36.0–46.0)
Hemoglobin: 11.9 g/dL — ABNORMAL LOW (ref 12.0–15.0)
MCH: 29.7 pg (ref 26.0–34.0)
MCHC: 32.1 g/dL (ref 30.0–36.0)
MCV: 92.5 fL (ref 80.0–100.0)
Platelets: 117 10*3/uL — ABNORMAL LOW (ref 150–400)
RBC: 4.01 MIL/uL (ref 3.87–5.11)
RDW: 13.8 % (ref 11.5–15.5)
WBC: 8.4 10*3/uL (ref 4.0–10.5)
nRBC: 0 % (ref 0.0–0.2)

## 2020-08-12 LAB — LIPID PANEL
Cholesterol: 148 mg/dL (ref 0–200)
HDL: 38 mg/dL — ABNORMAL LOW (ref >40)
LDL Cholesterol: 94 mg/dL (ref 0–99)
Total CHOL/HDL Ratio: 3.9 RATIO
Triglycerides: 80 mg/dL (ref <150)
VLDL: 16 mg/dL (ref 0–40)

## 2020-08-12 LAB — MAGNESIUM: Magnesium: 2 mg/dL (ref 1.7–2.4)

## 2020-08-12 LAB — PROTIME-INR
INR: 1.5 — ABNORMAL HIGH (ref 0.8–1.2)
Prothrombin Time: 17.4 seconds — ABNORMAL HIGH (ref 11.4–15.2)

## 2020-08-12 LAB — PHOSPHORUS: Phosphorus: 2.7 mg/dL (ref 2.5–4.6)

## 2020-08-12 LAB — APTT: aPTT: 35 seconds (ref 24–36)

## 2020-08-12 MED ORDER — SPIRONOLACTONE 25 MG PO TABS
25.0000 mg | ORAL_TABLET | Freq: Every day | ORAL | Status: DC
  Administered 2020-08-12 – 2020-08-14 (×3): 25 mg via ORAL
  Filled 2020-08-12 (×3): qty 1

## 2020-08-12 MED ORDER — GABAPENTIN 300 MG PO CAPS
300.0000 mg | ORAL_CAPSULE | Freq: Two times a day (BID) | ORAL | Status: DC
  Administered 2020-08-12 – 2020-08-15 (×7): 300 mg via ORAL
  Filled 2020-08-12 (×7): qty 1

## 2020-08-12 MED ORDER — CARVEDILOL 12.5 MG PO TABS
12.5000 mg | ORAL_TABLET | Freq: Two times a day (BID) | ORAL | Status: DC
  Administered 2020-08-12 – 2020-08-15 (×6): 12.5 mg via ORAL
  Filled 2020-08-12 (×3): qty 1
  Filled 2020-08-12: qty 4
  Filled 2020-08-12: qty 1

## 2020-08-12 MED ORDER — ASPIRIN EC 81 MG PO TBEC
81.0000 mg | DELAYED_RELEASE_TABLET | Freq: Every day | ORAL | Status: DC
  Administered 2020-08-12 – 2020-08-15 (×4): 81 mg via ORAL
  Filled 2020-08-12 (×4): qty 1

## 2020-08-12 MED ORDER — ADULT MULTIVITAMIN W/MINERALS CH
1.0000 | ORAL_TABLET | Freq: Every day | ORAL | Status: DC
  Administered 2020-08-13 – 2020-08-15 (×3): 1 via ORAL
  Filled 2020-08-12 (×3): qty 1

## 2020-08-12 MED ORDER — METHOCARBAMOL 500 MG PO TABS
750.0000 mg | ORAL_TABLET | Freq: Three times a day (TID) | ORAL | Status: DC | PRN
  Administered 2020-08-13 – 2020-08-15 (×3): 750 mg via ORAL
  Filled 2020-08-12 (×3): qty 2

## 2020-08-12 MED ORDER — MUPIROCIN 2 % EX OINT
1.0000 "application " | TOPICAL_OINTMENT | Freq: Two times a day (BID) | CUTANEOUS | Status: DC
  Administered 2020-08-12 – 2020-08-15 (×7): 1 via NASAL
  Filled 2020-08-12: qty 22

## 2020-08-12 NOTE — Progress Notes (Signed)
Patient is off Oxygen doing well states she feels fine. Most likely will not need BiPAP tonight.

## 2020-08-12 NOTE — Progress Notes (Signed)
PROGRESS NOTE    [name] [name]  IWP:[id number] DOB: 02-Aug-1961 DOA: 08/11/2020 PCP: Ignatius Specking, MD  Outpatient Specialists:   Brief Narrative:  As per H and P "[name] [name] is a 59 y.o. female with medical history significant for hypertension, hyperlipidemia, anticardiolipin syndrome, chronic combined systolic and diastolic heart failure and tobacco abuse who presents to the emergency department due to shortness of breath that woke her up from sleep this morning.  Patient states that she had 2 cheese pizza around 2 PM yesterday (8/12) and did not have dinner prior to going to bed.  She suddenly woke up gasping for breath and with increased work of breathing, so she activated EMS and she was taken to the ED for further evaluation and management.  Patient states that she has noted variable weight changes within last 2 weeks with weight ranging from 220-230 pounds, however, she endorsed increased abdominal girth with increased leg swelling within last 4 days.  Patient also complained of cough which she thinks was due to history of allergy to hay (recently exposed to neighbors hay field).  She denies fever, chills,, nausea, vomiting or abdominal pain.  ED Course:  In the emergency department, she was tachycardic, tachypneic and O2 sat ranged within 90-91% on room air, supplemental oxygen via [employer] was initially given with O2 sat dropping to 86%, she was then placed on a nonrebreather, however patient was reported to present with coughing up foamy pink-tinged sputum, she was then placed on BiPAP with eventual improvement in O2 sats to 99-100%.  She was also noted to be diaphoretic.  IV Lasix 60 mg and 1/2 inch of Nitropaste was provided. Work-up in the ED, showed mild leukocytosis, hypokalemia, hyponatremia, BNP 571 (this was around 23 about a year ago) lactic acid 3.7, SARS coronavirus 2 was negative.  Initial chest x-ray showed no acute cardiopulmonary abnormality.  Repeat chest x-ray showed  cardiomegaly with increased interstitial pattern compatible with interstitial edema and left pleural effusion.  IV Lasix was given as described above, though  patient has not had any urinary output.  Breathing treatment with albuterol was also provided.  Hospitalist was asked to admit.  For further evaluation and management".  08/11/2020: Patient was admitted with flash pulmonary edema, with associated chest and back pain afterwards.  Patient has history of Massive MI, Anti-phopholipid antibody syndrome, Combined systolic and diastolic CHF with EF of 20-25% as per ECHO done on 01/06/2020, and patient is status post AICD placement.  Echo done on 08/11/2020 revealed: 1. Limited visualization difficult to discern more focal wall motion  abnormalities. Compared to the Jan 06, 2020 echo there is more prominent  global hypokinesis and at least the anteroseptal wall appears akinetic. Marland Kitchen  Left ventricular ejection fraction, by  estimation, is 15-20%. The left ventricle has severely decreased  function. The left ventricle demonstrates global hypokinesis. Left  ventricular diastolic parameters are indeterminate. Elevated left atrial  pressure.  2. Right ventricular systolic function is normal. The right ventricular  size is normal.  3. Left atrial size was severely dilated.  4. The mitral valve is normal in structure. Mild mitral valve  regurgitation. No evidence of mitral stenosis.  5. The aortic valve was not well visualized. Aortic valve regurgitation  is not visualized. No aortic stenosis is present.  6. The inferior vena cava is normal in size with greater than 50%  respiratory variability, suggesting right atrial pressure of 3 mmHg.   -SOB is improving with diuresis -No chest and  back pain have resolved significantly. -Patient is slowly improving.  Assessment & Plan:   Principal Problem:   Acute respiratory failure with hypoxia (HCC) Active Problems:   Hyperlipidemia   Essential  hypertension, benign   Anticardiolipin syndrome (HCC)   Dual implantable cardioverter-defibrillator in situ   Chronic combined systolic (congestive) and diastolic (congestive) heart failure (HCC)   Hyponatremia   Hypokalemia   GERD (gastroesophageal reflux disease)   Allergic rhinitis  Acute systolic congestive heart failure:  -Continue IV Lasix 40 Mg twice daily. -Resume Coreg 12.5 Mg p.o. twice daily, Entresto 24-26 -26 mg tablet 1 tab twice daily, and Aldactone 25 Mg p.o. once daily. -Await cardiology input -Strict I's and O's. -Negative troponin. -Monitor renal function and electrolytes.    Electrolyte abnormalities (hyponatremia, hypokalemia) -Mild hyponatremia has resolved.  Sodium is 136 today. -Hypokalemia has resolved.  Potassium is 4 today.   -Continue to monitor renal function and electrolytes.      Leukocytosis possibly reactive -Resolved.  WBC is 8.4 today.    Anticardiolipin syndrome Patient is on warfarin INR is 1.5 today. Continue warfarin.  Titrate to goal.  Essential hypertension -At goal.   -Continue to monitor closely.    GERD Continue famotidine  Allergic rhinitis Continue Flonase and Mucinex  DVT prophylaxis: Warfarin Code Status: Full code Family Communication:  Disposition Plan: This will depend on hospital course   Consultants:   Awaiting cardiology input  Procedures:   Echo, with EF of 15 to 20%  Antimicrobials:   None   Subjective: Shortness of breath is improving. No chest pain. No back pain. No fever or chills.  Objective: Vitals:   08/12/20 1100 08/12/20 1132 08/12/20 1200 08/12/20 1300  BP: (!) 115/57   (!) 100/50  Pulse: 94 (!) 110 (!) 106 85  Resp: 10 (!) 25  (!) 25  Temp:  97.9 F (36.6 C)    TempSrc:  Oral    SpO2: 98% 99% 97% 99%  Weight:      Height:        Intake/Output Summary (Last 24 hours) at 08/12/2020 1353 Last data filed at 08/12/2020 1200 Gross per 24 hour  Intake 1680 ml  Output  2000 ml  Net -320 ml   Filed Weights   08/11/20 0226 08/11/20 1849 08/12/20 0445  Weight: 104.3 kg 107 kg 106.2 kg    Examination:  General exam: Obese, awake and alert appears calm and comfortable  Respiratory system: Clear to auscultation.  Cardiovascular system: S1 & S2 heard Gastrointestinal system: Abdomen is obese, soft and nontender.  Organs are not palpable.   Central nervous system: Alert and oriented.  Patient moves all extremities.   Extremities: Fullness of the ankle.  Data Reviewed: I have personally reviewed following labs and imaging studies  CBC: Recent Labs  Lab 08/11/20 0230 08/12/20 0639  WBC 11.0* 8.4  NEUTROABS 8.2*  --   HGB 14.1 11.9*  HCT 43.9 37.1  MCV 92.2 92.5  PLT 123* 117*   Basic Metabolic Panel: Recent Labs  Lab 08/11/20 0230 08/11/20 0432 08/12/20 0639  NA 134*  --  136  K 3.3*  --  4.0  CL 97*  --  106  CO2 26  --  21*  GLUCOSE 87  --  96  BUN 13  --  10  CREATININE 0.62  --  0.50  CALCIUM 9.0  --  8.5*  MG  --  2.0 2.0  PHOS  --  4.3 2.7   GFR: Estimated  Creatinine Clearance: 93.9 mL/min (by C-G formula based on SCr of 0.5 mg/dL). Liver Function Tests: Recent Labs  Lab 08/11/20 0230 08/12/20 0639  AST 19 18  ALT 18 21  ALKPHOS 56 45  BILITOT 0.5 0.8  PROT 7.3 6.4*  ALBUMIN 3.7 3.2*   No results for input(s): LIPASE, AMYLASE in the last 168 hours. No results for input(s): AMMONIA in the last 168 hours. Coagulation Profile: Recent Labs  Lab 08/11/20 0230 08/12/20 0639  INR 2.1* 1.5*   Cardiac Enzymes: No results for input(s): CKTOTAL, CKMB, CKMBINDEX, TROPONINI in the last 168 hours. BNP (last 3 results) No results for input(s): PROBNP in the last 8760 hours. HbA1C: No results for input(s): HGBA1C in the last 72 hours. CBG: No results for input(s): GLUCAP in the last 168 hours. Lipid Profile: No results for input(s): CHOL, HDL, LDLCALC, TRIG, CHOLHDL, LDLDIRECT in the last 72 hours. Thyroid Function  Tests: No results for input(s): TSH, T4TOTAL, FREET4, T3FREE, THYROIDAB in the last 72 hours. Anemia Panel: No results for input(s): VITAMINB12, FOLATE, FERRITIN, TIBC, IRON, RETICCTPCT in the last 72 hours. Urine analysis:    Component Value Date/Time   COLORURINE COLORLESS (A) 12/31/2018 1315   APPEARANCEUR CLEAR 12/31/2018 1315   LABSPEC 1.005 12/31/2018 1315   PHURINE 6.0 12/31/2018 1315   GLUCOSEU NEGATIVE 12/31/2018 1315   HGBUR NEGATIVE 12/31/2018 1315   BILIRUBINUR NEGATIVE 12/31/2018 1315   KETONESUR NEGATIVE 12/31/2018 1315   PROTEINUR NEGATIVE 12/31/2018 1315   UROBILINOGEN 0.2 12/23/2011 1440   NITRITE NEGATIVE 12/31/2018 1315   LEUKOCYTESUR NEGATIVE 12/31/2018 1315   Sepsis Labs: @LABRCNTIP (procalcitonin:4,lacticidven:4)  ) Recent Results (from the past 240 hour(s))  SARS Coronavirus 2 by RT PCR (hospital order, performed in [facility] hospital lab) Nasopharyngeal Nasopharyngeal Swab     Status: None   Collection Time: 08/11/20  2:40 AM   Specimen: Nasopharyngeal Swab  Result Value Ref Range Status   SARS Coronavirus 2 NEGATIVE NEGATIVE Final    Comment: (NOTE) SARS-CoV-2 target nucleic acids are NOT DETECTED.  The SARS-CoV-2 RNA is generally detectable in upper and lower respiratory specimens during the acute phase of infection. The lowest concentration of SARS-CoV-2 viral copies this assay can detect is 250 copies / mL. A negative result does not preclude SARS-CoV-2 infection and should not be used as the sole basis for treatment or other patient management decisions.  A negative result may occur with improper specimen collection / handling, submission of specimen other than nasopharyngeal swab, presence of viral mutation(s) within the areas targeted by this assay, and inadequate number of viral copies (<250 copies / mL). A negative result must be combined with clinical observations, patient history, and epidemiological information.  Fact Sheet for  Patients:   08/13/20  Fact Sheet for Healthcare Providers: BoilerBrush.com.cy  This test is not yet approved or  cleared by the https://pope.com/ FDA and has been authorized for detection and/or diagnosis of SARS-CoV-2 by FDA under an Emergency Use Authorization (EUA).  This EUA will remain in effect (meaning this test can be used) for the duration of the COVID-19 declaration under Section 564(b)(1) of the Act, 21 U.S.C. section 360bbb-3(b)(1), unless the authorization is terminated or revoked sooner.  Performed at [facility], [address] [place], [place]   MRSA PCR Screening     Status: Abnormal   Collection Time: 08/11/20  6:48 PM   Specimen: Nasopharyngeal  Result Value Ref Range Status   MRSA by PCR POSITIVE (A) NEGATIVE Final  Comment:        The GeneXpert MRSA Assay (FDA approved for NASAL specimens only), is one component of a comprehensive MRSA colonization surveillance program. It is not intended to diagnose MRSA infection nor to guide or monitor treatment for MRSA infections. CRITICAL RESULT CALLED TO, READ BACK BY AND VERIFIED WITH: Angela Nevin @[id number]  08/12/2020 KAY Performed at [facility], [address] [place], [place]          Radiology Studies: CT Angio Chest PE W/Cm &/Or Wo Cm  Result Date: 08/11/2020 CLINICAL DATA:  Shortness of breath EXAM: CT ANGIOGRAPHY CHEST WITH CONTRAST TECHNIQUE: Multidetector CT imaging of the chest was performed using the standard protocol during bolus administration of intravenous contrast. Multiplanar CT image reconstructions and MIPs were obtained to evaluate the vascular anatomy. CONTRAST:  08/13/2020 OMNIPAQUE IOHEXOL 350 MG/ML SOLN COMPARISON:  None. FINDINGS: Cardiovascular: There is a optimal opacification of the pulmonary arteries. There is no central,segmental, or subsegmental filling defects within the pulmonary arteries. There is  moderate cardiomegaly with left ventricular enlargement. No pericardial effusion or thickening. No evidence right heart strain. There is normal three-vessel brachiocephalic anatomy without proximal stenosis. Scattered aortic atherosclerosis is noted. Coronary artery calcifications are seen. A left-sided pacemaker seen with the lead tips at the right atrium and right ventricle. Mediastinum/Nodes: No hilar, mediastinal, or axillary adenopathy. Thyroid gland, trachea, and esophagus demonstrate no significant findings. Lungs/Pleura: Multifocal hazy patchy airspace opacities are seen of the right middle lobe and both lung bases. There are small bilateral pleural effusions. Upper Abdomen: No acute abnormalities present in the visualized portions of the upper abdomen. There is a small hiatal hernia present. Musculoskeletal: No chest wall abnormality. No acute or significant osseous findings. Review of the MIP images confirms the above findings. IMPRESSION: No central, segmental, or subsegmental pulmonary embolism. Multifocal hazy patchy airspace opacities seen at both lower lungs which could be due to asymmetric edema. Small bilateral pleural effusions Moderate cardiomegaly Aortic Atherosclerosis (ICD10-I70.0). Electronically Signed   By: M.D.   On: 08/11/2020 15:45   DG Chest Port 1 View  Result Date: 08/11/2020 CLINICAL DATA:  59 year old female with shortness of breath, left side chest pain. COVID-19 status pending. Smoker. EXAM: PORTABLE CHEST 1 VIEW COMPARISON:  Chest CT 12/31/2018 and earlier. FINDINGS: Portable AP upright view at 0237 hours. Lung volumes and mediastinal contours are stable since last year. Stable left chest cardiac AICD. Visualized tracheal air column is within normal limits. No pneumothorax or pleural effusion. Coarse bilateral perihilar opacity appears chronic, and largely stable since January of 2020. No definite acute pulmonary opacity. Partially visible chronic lumbar fusion  hardware. IMPRESSION: Radiographic appearance of the chest is stable since January 2020. No acute cardiopulmonary abnormality. Electronically Signed   By: February 2020 M.D.   On: 08/11/2020 03:12   DG Chest Port 1V same Day  Result Date: 08/11/2020 CLINICAL DATA:  Shortness of breath with sudden progression. COVID test is negative. EXAM: PORTABLE CHEST 1 VIEW COMPARISON:  One-view chest x-ray 08/11/2020 FINDINGS: The heart is enlarged. Diffuse interstitial pattern has increased. Aortic atherosclerosis is present. A left pleural effusion is noted. Pacing wires are stable. IMPRESSION: 1. Cardiomegaly with increasing interstitial pattern compatible with interstitial edema. 2. Left pleural effusion. Electronically Signed   By: 08/13/2020 M.D.   On: 08/11/2020 04:09   ECHOCARDIOGRAM COMPLETE  Result Date: 08/11/2020    ECHOCARDIOGRAM REPORT   Patient Name:   [name] [name] Date of Exam: 08/11/2020 Medical Rec #:  [id number]      Height:       65.0 in Accession #:    [id number]     Weight:       230.0 lb Date of Birth:  1961/01/01      BSA:          2.099 m Patient Age:    59 years       BP:           93/58 mmHg Patient Gender: F              HR:           109 bpm. [name] Procedure: 2D Echo Indications:    CHF-Acute Systolic 428.21 / I50.21  History:        Patient has prior history of Echocardiogram examinations, most                 recent 01/06/2020. Signs/Symptoms:Chest Pain; Risk                 Factors:Dyslipidemia, Hypertension and Current Smoker.                 Peripheral Artery Disease, GERD, Dual implantable                 cardioverter-defibrillator in situ.  Sonographer:    Jeryl Columbia RDCS (AE) Referring Phys: [id number] OLADAPO ADEFESO IMPRESSIONS  1. Limited visualization difficult to discern more focal wall motion abnormalities. Compared to the Jan 06, 2020 echo there is more prominent global hypokinesis and at least the anteroseptal wall appears akinetic. Marland Kitchen Left ventricular  ejection fraction, by  estimation, is 15-20%. The left ventricle has severely decreased function. The left ventricle demonstrates global hypokinesis. Left ventricular diastolic parameters are indeterminate. Elevated left atrial pressure.  2. Right ventricular systolic function is normal. The right ventricular size is normal.  3. Left atrial size was severely dilated.  4. The mitral valve is normal in structure. Mild mitral valve regurgitation. No evidence of mitral stenosis.  5. The aortic valve was not well visualized. Aortic valve regurgitation is not visualized. No aortic stenosis is present.  6. The inferior vena cava is normal in size with greater than 50% respiratory variability, suggesting right atrial pressure of 3 mmHg. FINDINGS  Left Ventricle: Limited visualization difficult to discern more focal wall motion abnormalities. Compared to the Jan 06, 2020 echo there is more prominent global hypokinesis and at least the anteroseptal wall appears akinetic. Left ventricular ejection fraction, by estimation, is 15-20%. The left ventricle has severely decreased function. The left ventricle demonstrates global hypokinesis. The left ventricular internal cavity size was normal in size. There is no left ventricular hypertrophy. Left ventricular diastolic parameters are indeterminate. Elevated left atrial pressure. Right Ventricle: The right ventricular size is normal. No increase in right ventricular wall thickness. Right ventricular systolic function is normal. Left Atrium: Left atrial size was severely dilated. Right Atrium: Right atrial size was normal in size. Pericardium: Trivial pericardial effusion is present. The pericardial effusion is circumferential. Mitral Valve: The mitral valve is normal in structure. Mild mitral valve regurgitation. No evidence of mitral valve stenosis. Tricuspid Valve: The tricuspid valve is normal in structure. Tricuspid valve regurgitation is not demonstrated. No evidence of tricuspid  stenosis. Aortic Valve: The aortic valve was not well visualized. Aortic valve regurgitation is not visualized. No aortic stenosis is present. Aortic valve mean gradient measures 4.1 mmHg. Aortic valve peak gradient measures 7.7 mmHg. Pulmonic Valve:  The pulmonic valve was not well visualized. Pulmonic valve regurgitation is not visualized. No evidence of pulmonic stenosis. Aorta: The aortic root is normal in size and structure. Pulmonary Artery: Indeterminant PASP, inadequate TR jet. Venous: The inferior vena cava is normal in size with greater than 50% respiratory variability, suggesting right atrial pressure of 3 mmHg. IAS/Shunts: The interatrial septum was not well visualized. Additional Comments: A pacer wire is visualized.  LEFT VENTRICLE PLAX 2D LVIDd:         5.89 cm Diastology LVIDs:         5.42 cm LV e' lateral:   4.20 cm/s LV PW:         1.00 cm LV E/e' lateral: 23.6 LV IVS:        1.03 cm LV e' medial:    4.73 cm/s                        LV E/e' medial:  20.9  RIGHT VENTRICLE RV S prime:     8.83 cm/s TAPSE (M-mode): 1.5 cm LEFT ATRIUM             Index       RIGHT ATRIUM           Index LA diam:        5.00 cm 2.38 cm/m  RA Area:     10.10 cm LA Vol (A2C):   99.4 ml 47.35 ml/m RA Volume:   23.60 ml  11.24 ml/m LA Vol (A4C):   94.2 ml 44.87 ml/m LA Biplane Vol: 97.5 ml 46.45 ml/m  AORTIC VALVE AV Vmax:           138.99 cm/s AV Vmean:          95.477 cm/s AV VTI:            0.232 m AV Peak Grad:      7.7 mmHg AV Mean Grad:      4.1 mmHg LVOT Vmax:         86.98 cm/s LVOT Vmean:        53.089 cm/s LVOT VTI:          0.129 m LVOT/AV VTI ratio: 0.56  AORTA Ao Root diam: 2.70 cm MITRAL VALVE MV Area (PHT): 4.46 cm    SHUNTS MV Decel Time: 170 msec    Systemic VTI: 0.13 m MR Peak grad: 69.2 mmHg MR Mean grad: 44.0 mmHg MR Vmax:      416.00 cm/s MR Vmean:     311.0 cm/s MV E velocity: 99.00 cm/s Dina Rich MD Electronically signed by Dina Rich MD Signature Date/Time: 08/11/2020/5:13:18 PM     Final         Scheduled Meds: . aspirin EC  81 mg Oral Daily  . carvedilol  12.5 mg Oral BID WC  . Chlorhexidine Gluconate Cloth  6 each Topical Daily  . famotidine  20 mg Oral Daily  . fluticasone  1 spray Each Nare Daily  . furosemide  40 mg Intravenous BID  . gabapentin  300 mg Oral BID  . guaiFENesin  1,200 mg Oral BID  . mouth rinse  15 mL Mouth Rinse BID  . multivitamins ther. w/minerals  1 tablet Oral Daily  . mupirocin ointment  1 application Nasal BID  . pneumococcal 23 valent vaccine  0.5 mL Intramuscular Tomorrow-1000  . sacubitril-valsartan  1 tablet Oral BID  . spironolactone  25 mg Oral Daily  . warfarin  10  mg Oral Once per day on Sun Tue Wed Fri Sat  . [START ON 08/14/2020] warfarin  5 mg Oral Once per day on Mon Thu   Continuous Infusions:   LOS: 1 day    Time spent: 35 minutes    Berton Mount, MD  [facility] Pager #: [phone] 7PM-7AM contact night coverage as above

## 2020-08-13 LAB — RENAL FUNCTION PANEL
Albumin: 3.2 g/dL — ABNORMAL LOW (ref 3.5–5.0)
Anion gap: 8 (ref 5–15)
BUN: 12 mg/dL (ref 6–20)
CO2: 26 mmol/L (ref 22–32)
Calcium: 8.6 mg/dL — ABNORMAL LOW (ref 8.9–10.3)
Chloride: 100 mmol/L (ref 98–111)
Creatinine, Ser: 0.62 mg/dL (ref 0.44–1.00)
GFR calc Af Amer: >60 mL/min (ref >60)
GFR calc non Af Amer: >60 mL/min (ref >60)
Glucose, Bld: 96 mg/dL (ref 70–99)
Phosphorus: 3.3 mg/dL (ref 2.5–4.6)
Potassium: 3.8 mmol/L (ref 3.5–5.1)
Sodium: 134 mmol/L — ABNORMAL LOW (ref 135–145)

## 2020-08-13 LAB — MAGNESIUM: Magnesium: 1.9 mg/dL (ref 1.7–2.4)

## 2020-08-13 LAB — PROTIME-INR
INR: 1.6 — ABNORMAL HIGH (ref 0.8–1.2)
Prothrombin Time: 18.5 seconds — ABNORMAL HIGH (ref 11.4–15.2)

## 2020-08-13 MED ORDER — WARFARIN SODIUM 7.5 MG PO TABS
15.0000 mg | ORAL_TABLET | Freq: Once | ORAL | Status: AC
  Administered 2020-08-13: 15 mg via ORAL
  Filled 2020-08-13: qty 2

## 2020-08-13 MED ORDER — WARFARIN - PHARMACIST DOSING INPATIENT: Freq: Every day | Status: DC

## 2020-08-13 NOTE — Progress Notes (Signed)
ANTICOAGULATION CONSULT NOTE -   Pharmacy Consult for:  warfarin dosing Indication: anti-cardiolipin antibody positive  Patient Measurements: Height: 5\' 5"  (165.1 cm) Weight: 106.2 kg (234 lb 2.1 oz) IBW/kg (Calculated) : 57 Heparin Dosing Weight: HEPARIN DW (KG): 82  Vital Signs: Temp: 97.5 F (36.4 C) (08/15 1127) Temp Source: Oral (08/15 1127) BP: 101/66 (08/15 1000) Pulse Rate: 85 (08/15 1127)  Labs: Recent Labs    08/11/20 0230 08/11/20 0432 08/12/20 0639 08/13/20 0607  HGB 14.1  --  11.9*  --   HCT 43.9  --  37.1  --   PLT 123*  --  117*  --   APTT  --   --  35  --   LABPROT 22.4*  --  17.4* 18.5*  INR 2.1*  --  1.5* 1.6*  CREATININE 0.62  --  0.50 0.62  TROPONINIHS 13 12  --   --     Estimated Creatinine Clearance: 93.9 mL/min (by C-G formula based on SCr of 0.62 mg/dL).   Medical History: Past Medical History:  Diagnosis Date  . Aneurysm of internal carotid artery   . Anticardiolipin antibody positive    Chronic Coumadin  . Chronic combined systolic (congestive) and diastolic (congestive) heart failure (HCC)   . Cocaine abuse in remission (HCC)   . Coronary atherosclerosis of native coronary artery    Previous invasive cardiac testing was done at a facility in [place], [place].  Occluded diagonal, Diffuse LAD disease, Occluded Cx, and non obstructive RCA.   . Essential hypertension   . Headache   . History of pneumonia   . Ischemic cardiomyopathy    LVEF 30-35%  . Mixed hyperlipidemia   . PVD (peripheral vascular disease) (HCC)   . Raynaud's phenomenon   . 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  Patient has  history  of being positive  for anti-cardiolipin antibody.  Baseline CBC is WNL.    Home dose: warfarin 10mg  on Sun/Tues/Wed/Fri/Sat                       warfarin 5mg  on Mon and  Thurs  Concurrent anti-platelet medication: aspirin 81mg /day Drug interactions: no major interactions at this time   Goal of Therapy:  INR 2-3 Monitor platelets by anticoagulation protocol: Yes   Plan:  Give warfarin 15mg  x1 dose tonight (50% boost over home dose) Daily PT/INR and CBC Monitor patient for s/s of bleeding.  08/13/2020,1:28 PM

## 2020-08-13 NOTE — Progress Notes (Signed)
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  Patient was admitted with flash pulmonary edema/acute on chronic systolic congestive heart failure.  Patient is responding to IV diuresis.  Will consult cardiology team in the morning.  Echo done on 08/11/2020 revealed: 1. Limited visualization difficult to discern more focal wall motion  abnormalities. Compared to the Jan 06, 2020 echo there is more prominent  global hypokinesis and at least the anteroseptal wall appears akinetic. Marland Kitchen  Left ventricular ejection fraction, by  estimation, is 15-20%. The left ventricle has severely decreased  function. The left ventricle demonstrates global hypokinesis. Left  ventricular diastolic parameters are indeterminate. Elevated left atrial  pressure.  2. Right ventricular systolic function is normal. The right ventricular  size is normal.  3. Left atrial size was severely dilated.  4. The mitral valve is normal in structure. Mild mitral valve  regurgitation. No evidence of mitral stenosis.  5. The aortic valve was not well visualized. Aortic valve regurgitation  is not visualized. No aortic stenosis is present.  6. The inferior vena cava is normal in size with greater than 50%  respiratory variability, suggesting right atrial pressure of 3 mmHg.   -SOB is improving with diuresis -No chest and back pain have resolved significantly. -Patient is slowly improving.  Assessment & Plan:   Principal Problem:   Acute respiratory failure with hypoxia (HCC) Active Problems:   Hyperlipidemia   Essential hypertension, benign   Anticardiolipin  syndrome (HCC)   Dual implantable cardioverter-defibrillator in situ   Chronic combined systolic (congestive) and diastolic (congestive) heart failure (HCC)   Hyponatremia   Hypokalemia   GERD (gastroesophageal reflux disease)   Allergic rhinitis  Acute systolic congestive heart failure:  -Continue IV Lasix 40 Mg twice daily. -Resumed Coreg 12.5 Mg p.o. twice daily, Entresto 24-26 -26 mg tablet 1 tab twice daily, and Aldactone 25 Mg p.o. once daily. -Await cardiology input -Strict I's and O's. -Negative troponin. -Monitor renal function and electrolytes.    Electrolyte abnormalities (hyponatremia, hypokalemia) -Mild hyponatremia has resolved.  Sodium is 134 today. -Hypokalemia has resolved.  Potassium is 3.8 today.   -Continue to monitor renal function and electrolytes.    Leukocytosis likely reactive -Resolved.  WBC is 8.4 today.    Anticardiolipin syndrome Patient is on warfarin INR is 1.6 today. Pharmacy to dose warfarin.    Essential hypertension -At goal.   -Continue to monitor closely.    GERD Continue famotidine  Allergic rhinitis Continue Flonase and Mucinex  DVT prophylaxis: Warfarin Code Status: Full code Family Communication:  Disposition Plan: This will depend on hospital course   Consultants:   Awaiting cardiology input  Procedures:   Echo, with EF of 15 to 20%  Antimicrobials:   None   Subjective: Shortness of breath is improving. No chest pain. No back pain. No fever or chills.  Objective: Vitals:   08/13/20 1300 08/13/20 1400 08/13/20 1500 08/13/20 1638  BP: (!) 88/64 112/75 112/72   Pulse: 80 80 87 88  Resp: 17 (!) 22 (!) 24 15  Temp:    97.9 F (36.6 C)  TempSrc:    Oral  SpO2: (!) 77% 98% 99% 99%  Weight:  Height:        Intake/Output Summary (Last 24 hours) at 08/13/2020 1848 Last data filed at 08/13/2020 0100 Gross per 24 hour  Intake 480 ml  Output --  Net 480 ml   Filed Weights   08/11/20 1849  08/12/20 0445 08/13/20 0500  Weight: 107 kg 106.2 kg 106.2 kg    Examination:  General exam: Obese, awake and alert appears calm and comfortable  Respiratory system: Clear to auscultation.  Cardiovascular system: S1 & S2 heard Gastrointestinal system: Abdomen is obese, soft and nontender.  Organs are not palpable.   Central nervous system: Alert and oriented.  Patient moves all extremities.   Extremities: Fullness of the ankle.  Data Reviewed: I have personally reviewed following labs and imaging studies  CBC: Recent Labs  Lab 08/11/20 0230 08/12/20 0639  WBC 11.0* 8.4  NEUTROABS 8.2*  --   HGB 14.1 11.9*  HCT 43.9 37.1  MCV 92.2 92.5  PLT 123* 117*   Basic Metabolic Panel: Recent Labs  Lab 08/11/20 0230 08/11/20 0432 08/12/20 0639 08/13/20 0607  NA 134*  --  136 134*  K 3.3*  --  4.0 3.8  CL 97*  --  106 100  CO2 26  --  21* 26  GLUCOSE 87  --  96 96  BUN 13  --  10 12  CREATININE 0.62  --  0.50 0.62  CALCIUM 9.0  --  8.5* 8.6*  MG  --  2.0 2.0 1.9  PHOS  --  4.3 2.7 3.3   GFR: Estimated Creatinine Clearance: 93.9 mL/min (by C-G formula based on SCr of 0.62 mg/dL). Liver Function Tests: Recent Labs  Lab 08/11/20 0230 08/12/20 0639 08/13/20 0607  AST 19 18  --   ALT 18 21  --   ALKPHOS 56 45  --   BILITOT 0.5 0.8  --   PROT 7.3 6.4*  --   ALBUMIN 3.7 3.2* 3.2*   No results for input(s): LIPASE, AMYLASE in the last 168 hours. No results for input(s): AMMONIA in the last 168 hours. Coagulation Profile: Recent Labs  Lab 08/11/20 0230 08/12/20 0639 08/13/20 0607  INR 2.1* 1.5* 1.6*   Cardiac Enzymes: No results for input(s): CKTOTAL, CKMB, CKMBINDEX, TROPONINI in the last 168 hours. BNP (last 3 results) No results for input(s): PROBNP in the last 8760 hours. HbA1C: No results for input(s): HGBA1C in the last 72 hours. CBG: No results for input(s): GLUCAP in the last 168 hours. Lipid Profile: Recent Labs    08/12/20 0639  CHOL 148  HDL 38*   LDLCALC 94  TRIG 80  CHOLHDL 3.9   Thyroid Function Tests: No results for input(s): TSH, T4TOTAL, FREET4, T3FREE, THYROIDAB in the last 72 hours. Anemia Panel: No results for input(s): VITAMINB12, FOLATE, FERRITIN, TIBC, IRON, RETICCTPCT in the last 72 hours. Urine analysis:    Component Value Date/Time   COLORURINE COLORLESS (A) 12/31/2018 1315   APPEARANCEUR CLEAR 12/31/2018 1315   LABSPEC 1.005 12/31/2018 1315   PHURINE 6.0 12/31/2018 1315   GLUCOSEU NEGATIVE 12/31/2018 1315   HGBUR NEGATIVE 12/31/2018 1315   BILIRUBINUR NEGATIVE 12/31/2018 1315   KETONESUR NEGATIVE 12/31/2018 1315   PROTEINUR NEGATIVE 12/31/2018 1315   UROBILINOGEN 0.2 12/23/2011 1440   NITRITE NEGATIVE 12/31/2018 1315   LEUKOCYTESUR NEGATIVE 12/31/2018 1315   Sepsis Labs: @LABRCNTIP (procalcitonin:4,lacticidven:4)  ) Recent Results (from the past 240 hour(s))  SARS Coronavirus 2 by RT PCR (hospital order, performed in [facility] hospital lab) Nasopharyngeal Nasopharyngeal  Swab     Status: None   Collection Time: 08/11/20  2:40 AM   Specimen: Nasopharyngeal Swab  Result Value Ref Range Status   SARS Coronavirus 2 NEGATIVE NEGATIVE Final    Comment: (NOTE) SARS-CoV-2 target nucleic acids are NOT DETECTED.  The SARS-CoV-2 RNA is generally detectable in upper and lower respiratory specimens during the acute phase of infection. The lowest concentration of SARS-CoV-2 viral copies this assay can detect is 250 copies / mL. A negative result does not preclude SARS-CoV-2 infection and should not be used as the sole basis for treatment or other patient management decisions.  A negative result may occur with improper specimen collection / handling, submission of specimen other than nasopharyngeal swab, presence of viral mutation(s) within the areas targeted by this assay, and inadequate number of viral copies (<250 copies / mL). A negative result must be combined with clinical observations, patient history,  and epidemiological information.  Fact Sheet for Patients:   BoilerBrush.com.cy  Fact Sheet for Healthcare Providers: https://pope.com/  This test is not yet approved or  cleared by the [place] FDA and has been authorized for detection and/or diagnosis of SARS-CoV-2 by FDA under an Emergency Use Authorization (EUA).  This EUA will remain in effect (meaning this test can be used) for the duration of the COVID-19 declaration under Section 564(b)(1) of the Act, 21 U.S.C. section 360bbb-3(b)(1), unless the authorization is terminated or revoked sooner.  Performed at [facility], [address] [place], [place] [address]   MRSA PCR Screening     Status: Abnormal   Collection Time: 08/11/20  6:48 PM   Specimen: Nasopharyngeal  Result Value Ref Range Status   MRSA by PCR POSITIVE (A) NEGATIVE Final    Comment:        The GeneXpert MRSA Assay (FDA approved for NASAL specimens only), is one component of a comprehensive MRSA colonization surveillance program. It is not intended to diagnose MRSA infection nor to guide or monitor treatment for MRSA infections. CRITICAL RESULT CALLED TO, READ BACK BY AND VERIFIED WITH: Angela Nevin @0754  08/12/2020 KAY Performed at [facility], [address], [place], [place]          Radiology Studies: No results found.      Scheduled Meds: . aspirin EC  81 mg Oral Daily  . carvedilol  12.5 mg Oral BID WC  . Chlorhexidine Gluconate Cloth  6 each Topical Daily  . famotidine  20 mg Oral Daily  . fluticasone  1 spray Each Nare Daily  . furosemide  40 mg Intravenous BID  . gabapentin  300 mg Oral BID  . guaiFENesin  1,200 mg Oral BID  . mouth rinse  15 mL Mouth Rinse BID  . multivitamin with minerals  1 tablet Oral Daily  . mupirocin ointment  1 application Nasal BID  . sacubitril-valsartan  1 tablet Oral BID  . spironolactone  25 mg Oral Daily  . Warfarin -  Pharmacist Dosing Inpatient   Does not apply q1600   Continuous Infusions:   LOS: 2 days    Time spent: 35 minutes    [id number], MD  [facility] Pager #: [phone] 7PM-7AM contact night coverage as above

## 2020-08-14 DIAGNOSIS — I5023 Acute on chronic systolic (congestive) heart failure: Secondary | ICD-10-CM

## 2020-08-14 DIAGNOSIS — Z8673 Personal history of transient ischemic attack (TIA), and cerebral infarction without residual deficits: Secondary | ICD-10-CM

## 2020-08-14 DIAGNOSIS — I255 Ischemic cardiomyopathy: Secondary | ICD-10-CM

## 2020-08-14 LAB — CBC
HCT: 38.7 % (ref 36.0–46.0)
Hemoglobin: 12.4 g/dL (ref 12.0–15.0)
MCH: 29.7 pg (ref 26.0–34.0)
MCHC: 32 g/dL (ref 30.0–36.0)
MCV: 92.6 fL (ref 80.0–100.0)
Platelets: 183 10*3/uL (ref 150–400)
RBC: 4.18 MIL/uL (ref 3.87–5.11)
RDW: 13.7 % (ref 11.5–15.5)
WBC: 8.1 10*3/uL (ref 4.0–10.5)
nRBC: 0 % (ref 0.0–0.2)

## 2020-08-14 LAB — PROTIME-INR
INR: 1.9 — ABNORMAL HIGH (ref 0.8–1.2)
Prothrombin Time: 21.2 seconds — ABNORMAL HIGH (ref 11.4–15.2)

## 2020-08-14 LAB — BASIC METABOLIC PANEL
Anion gap: 11 (ref 5–15)
BUN: 15 mg/dL (ref 6–20)
CO2: 25 mmol/L (ref 22–32)
Calcium: 8.6 mg/dL — ABNORMAL LOW (ref 8.9–10.3)
Chloride: 101 mmol/L (ref 98–111)
Creatinine, Ser: 0.62 mg/dL (ref 0.44–1.00)
GFR calc Af Amer: >60 mL/min (ref >60)
GFR calc non Af Amer: >60 mL/min (ref >60)
Glucose, Bld: 82 mg/dL (ref 70–99)
Potassium: 4 mmol/L (ref 3.5–5.1)
Sodium: 137 mmol/L (ref 135–145)

## 2020-08-14 MED ORDER — SPIRONOLACTONE 25 MG PO TABS
12.5000 mg | ORAL_TABLET | Freq: Every day | ORAL | Status: DC
  Administered 2020-08-15: 12.5 mg via ORAL
  Filled 2020-08-14: qty 1
  Filled 2020-08-14 (×2): qty 0.5

## 2020-08-14 MED ORDER — HYDROCODONE-ACETAMINOPHEN 5-325 MG PO TABS
1.0000 | ORAL_TABLET | Freq: Four times a day (QID) | ORAL | Status: DC | PRN
  Administered 2020-08-14 – 2020-08-15 (×3): 2 via ORAL
  Filled 2020-08-14 (×3): qty 2

## 2020-08-14 MED ORDER — WARFARIN SODIUM 7.5 MG PO TABS
7.5000 mg | ORAL_TABLET | Freq: Once | ORAL | Status: AC
  Administered 2020-08-14: 7.5 mg via ORAL
  Filled 2020-08-14: qty 1

## 2020-08-14 MED ORDER — ONDANSETRON HCL 4 MG/2ML IJ SOLN
4.0000 mg | Freq: Four times a day (QID) | INTRAMUSCULAR | Status: DC | PRN
  Administered 2020-08-14 – 2020-08-15 (×2): 4 mg via INTRAVENOUS
  Filled 2020-08-14 (×2): qty 2

## 2020-08-14 MED ORDER — ACETAMINOPHEN 325 MG PO TABS
650.0000 mg | ORAL_TABLET | Freq: Four times a day (QID) | ORAL | Status: DC | PRN
  Administered 2020-08-14: 650 mg via ORAL
  Filled 2020-08-14: qty 2

## 2020-08-14 MED ORDER — EZETIMIBE 10 MG PO TABS
10.0000 mg | ORAL_TABLET | Freq: Every day | ORAL | Status: DC
  Administered 2020-08-14 – 2020-08-15 (×2): 10 mg via ORAL
  Filled 2020-08-14 (×2): qty 1

## 2020-08-14 MED ORDER — WARFARIN SODIUM 5 MG PO TABS: 10.0000 mg | ORAL_TABLET | Freq: Once | ORAL | Status: DC

## 2020-08-14 NOTE — Progress Notes (Signed)
PROGRESS NOTE    [name] [name]  KTG:[id number] DOB: [birth year] DOA: 08/11/2020 PCP: Ignatius Specking, MD   Brief Narrative:  Per HPI: [name] [name] is a 59 y.o. female with medical history significant for hypertension, hyperlipidemia, anticardiolipin syndrome, chronic combined systolic and diastolic heart failure and tobacco abuse who presents to the emergency department due to shortness of breath that woke her up from sleep this morning.  Patient states that she had 2 cheese pizza around 2 PM yesterday (8/12) and did not have dinner prior to going to bed.  She suddenly woke up gasping for breath and with increased work of breathing, so she activated EMS and she was taken to the ED for further evaluation and management.  Patient states that she has noted variable weight changes within last 2 weeks with weight ranging from 220-230 pounds, however, she endorsed increased abdominal girth with increased leg swelling within last 4 days.  Patient also complained of cough which she thinks was due to history of allergy to hay (recently exposed to neighbors hay field).  She denies fever, chills,, nausea, vomiting or abdominal pain.  -Patient admitted with acute on chronic systolic heart failure and has been diuresing quite well over the weekend.  8/16: Patient seen by cardiology with recommendations to continue IV diuresis and observation.  Plan to transfer to telemetry.  Weight is not currently near baseline.  Aldactone reduced to 12.5 mg daily today.  Assessment & Plan:   Principal Problem:   Acute respiratory failure with hypoxia (HCC) Active Problems:   Hyperlipidemia   Essential hypertension, benign   Anticardiolipin syndrome (HCC)   Dual implantable cardioverter-defibrillator in situ   Chronic combined systolic (congestive) and diastolic (congestive) heart failure (HCC)   Hyponatremia   Hypokalemia   GERD (gastroesophageal reflux disease)   Allergic rhinitis   Acute on chronic systolic  heart failure with severe LV dysfunction -Current LVEF 15-20%, patient has ICD -Continue IV Lasix as prescribed for ongoing diuresis -Continue Aldactone at reduced dose of 12.5 mg daily, per cardiology -Continue Coreg -Continue Entresto  Positive anticardiolipin antibody -Continue Coumadin  Previous history of CVA  -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  Dyslipidemia -Intolerant to statin -Zetia per cardiology  Essential hypertension -Currently with soft blood pressure readings are require close monitoring -Aldactone dose reduced  GERD -Famotidine  Allergic rhinitis -Flonase and Mucinex   DVT prophylaxis: Coumadin Code Status: Full code Family Communication: None at bedside Disposition Plan:   Status is: Inpatient  Remains inpatient appropriate because:IV treatments appropriate due to intensity of illness or inability to take PO and Inpatient level of care appropriate due to severity of illness   Dispo: The patient is from: Home              Anticipated d/c is to: Home              Anticipated d/c date is: 1 day              Patient currently is not medically stable to d/c.  Patient needs continued IV diuresis per cardiology.  Consultants:   Cardiology  Procedures:  Echo done on 08/11/2020 revealed: 1. Limited visualization difficult to discern more focal wall motion  abnormalities. Compared to the Jan 06, 2020 echo there is more prominent  global hypokinesis and at least the anteroseptal wall appears akinetic. Marland Kitchen  Left ventricular ejection fraction, by  estimation, is 15-20%. The left ventricle has severely decreased  function. The left ventricle demonstrates  global hypokinesis. Left  ventricular diastolic parameters are indeterminate. Elevated left atrial  pressure.  2. Right ventricular systolic function is normal. The right ventricular  size is normal.  3. Left atrial size was severely dilated.  4. The mitral valve is normal in structure.  Mild mitral valve  regurgitation. No evidence of mitral stenosis.  5. The aortic valve was not well visualized. Aortic valve regurgitation  is not visualized. No aortic stenosis is present.  6. The inferior vena cava is normal in size with greater than 50%  respiratory variability, suggesting right atrial pressure of 3 mmHg.   Antimicrobials:  Anti-infectives (From admission, onward)   None       Subjective: Patient seen and evaluated today with no further shortness of breath or chest pain noted.  She feels at her usual baseline.  Objective: Vitals:   08/14/20 0700 08/14/20 0800 08/14/20 0900 08/14/20 1000  BP: 111/63 106/69 (!) 79/54 (!) 105/55  Pulse: 82 88 86 81  Resp: 20 (!) 25 20 13   Temp:      TempSrc:      SpO2: (!) 89% 99% 98% 95%  Weight:      Height:        Intake/Output Summary (Last 24 hours) at 08/14/2020 1115 Last data filed at 08/14/2020 1025 Gross per 24 hour  Intake --  Output 1500 ml  Net -1500 ml   Filed Weights   08/11/20 1849 08/12/20 0445 08/13/20 0500  Weight: 107 kg 106.2 kg 106.2 kg    Examination:  General exam: Appears calm and comfortable  Respiratory system: Clear to auscultation. Respiratory effort normal.  Currently on room air. Cardiovascular system: S1 & S2 heard, RRR.  Gastrointestinal system: Abdomen is soft Central nervous system: Alert and oriented. No focal neurological deficits. Extremities: Scant bilateral edema Skin: No rashes, lesions or ulcers Psychiatry: Judgement and insight appear normal. Mood & affect appropriate.     Data Reviewed: I have personally reviewed following labs and imaging studies  CBC: Recent Labs  Lab 08/11/20 0230 08/12/20 0639 08/14/20 0439  WBC 11.0* 8.4 8.1  NEUTROABS 8.2*  --   --   HGB 14.1 11.9* 12.4  HCT 43.9 37.1 38.7  MCV 92.2 92.5 92.6  PLT 123* 117* 183   Basic Metabolic Panel: Recent Labs  Lab 08/11/20 0230 08/11/20 0432 08/12/20 0639 08/13/20 0607  NA 134*  --  136  134*  K 3.3*  --  4.0 3.8  CL 97*  --  106 100  CO2 26  --  21* 26  GLUCOSE 87  --  96 96  BUN 13  --  10 12  CREATININE 0.62  --  0.50 0.62  CALCIUM 9.0  --  8.5* 8.6*  MG  --  2.0 2.0 1.9  PHOS  --  4.3 2.7 3.3   GFR: Estimated Creatinine Clearance: 93.9 mL/min (by C-G formula based on SCr of 0.62 mg/dL). Liver Function Tests: Recent Labs  Lab 08/11/20 0230 08/12/20 0639 08/13/20 0607  AST 19 18  --   ALT 18 21  --   ALKPHOS 56 45  --   BILITOT 0.5 0.8  --   PROT 7.3 6.4*  --   ALBUMIN 3.7 3.2* 3.2*   No results for input(s): LIPASE, AMYLASE in the last 168 hours. No results for input(s): AMMONIA in the last 168 hours. Coagulation Profile: Recent Labs  Lab 08/11/20 0230 08/12/20 0639 08/13/20 0607 08/14/20 0439  INR 2.1* 1.5* 1.6* 1.9*  Cardiac Enzymes: No results for input(s): CKTOTAL, CKMB, CKMBINDEX, TROPONINI in the last 168 hours. BNP (last 3 results) No results for input(s): PROBNP in the last 8760 hours. HbA1C: No results for input(s): HGBA1C in the last 72 hours. CBG: No results for input(s): GLUCAP in the last 168 hours. Lipid Profile: Recent Labs    08/12/20 0639  CHOL 148  HDL 38*  LDLCALC 94  TRIG 80  CHOLHDL 3.9   Thyroid Function Tests: No results for input(s): TSH, T4TOTAL, FREET4, T3FREE, THYROIDAB in the last 72 hours. Anemia Panel: No results for input(s): VITAMINB12, FOLATE, FERRITIN, TIBC, IRON, RETICCTPCT in the last 72 hours. Sepsis Labs: Recent Labs  Lab 08/11/20 0432 08/11/20 0633  LATICACIDVEN 3.7* 1.6    Recent Results (from the past 240 hour(s))  SARS Coronavirus 2 by RT PCR (hospital order, performed in [facility] hospital lab) Nasopharyngeal Nasopharyngeal Swab     Status: None   Collection Time: 08/11/20  2:40 AM   Specimen: Nasopharyngeal Swab  Result Value Ref Range Status   SARS Coronavirus 2 NEGATIVE NEGATIVE Final    Comment: (NOTE) SARS-CoV-2 target nucleic acids are NOT DETECTED.  The SARS-CoV-2 RNA  is generally detectable in upper and lower respiratory specimens during the acute phase of infection. The lowest concentration of SARS-CoV-2 viral copies this assay can detect is 250 copies / mL. A negative result does not preclude SARS-CoV-2 infection and should not be used as the sole basis for treatment or other patient management decisions.  A negative result may occur with improper specimen collection / handling, submission of specimen other than nasopharyngeal swab, presence of viral mutation(s) within the areas targeted by this assay, and inadequate number of viral copies (<250 copies / mL). A negative result must be combined with clinical observations, patient history, and epidemiological information.  Fact Sheet for Patients:   BoilerBrush.com.cy  Fact Sheet for Healthcare Providers: https://pope.com/  This test is not yet approved or  cleared by the [place] FDA and has been authorized for detection and/or diagnosis of SARS-CoV-2 by FDA under an Emergency Use Authorization (EUA).  This EUA will remain in effect (meaning this test can be used) for the duration of the COVID-19 declaration under Section 564(b)(1) of the Act, 21 U.S.C. section 360bbb-3(b)(1), unless the authorization is terminated or revoked sooner.  Performed at [facility], [address], [place], [place] [address]   MRSA PCR Screening     Status: Abnormal   Collection Time: 08/11/20  6:48 PM   Specimen: Nasopharyngeal  Result Value Ref Range Status   MRSA by PCR POSITIVE (A) NEGATIVE Final    Comment:        The GeneXpert MRSA Assay (FDA approved for NASAL specimens only), is one component of a comprehensive MRSA colonization surveillance program. It is not intended to diagnose MRSA infection nor to guide or monitor treatment for MRSA infections. CRITICAL RESULT CALLED TO, READ BACK BY AND VERIFIED WITH: Angela Nevin @0754  08/12/2020  KAY Performed at [facility], [address] [place], [place] [address]          Radiology Studies: No results found.      Scheduled Meds: . aspirin EC  81 mg Oral Daily  . carvedilol  12.5 mg Oral BID WC  . Chlorhexidine Gluconate Cloth  6 each Topical Daily  . ezetimibe  10 mg Oral Daily  . famotidine  20 mg Oral Daily  . fluticasone  1 spray Each Nare Daily  . furosemide  40 mg Intravenous BID  . gabapentin  300 mg Oral BID  . guaiFENesin  1,200 mg Oral BID  . mouth rinse  15 mL Mouth Rinse BID  . multivitamin with minerals  1 tablet Oral Daily  . mupirocin ointment  1 application Nasal BID  . sacubitril-valsartan  1 tablet Oral BID  . [START ON 08/15/2020] spironolactone  12.5 mg Oral Daily  . warfarin  7.5 mg Oral ONCE-1600  . Warfarin - Pharmacist Dosing Inpatient   Does not apply q1600    LOS: 3 days    Time spent: 30 minutes    Jovann Luse Hands Brunette, DO [facility]  If 7PM-7AM, please contact night-coverage [email or web address] 08/14/2020, 11:15 AM

## 2020-08-14 NOTE — Consult Note (Signed)
Cardiology Consultation:   Patient ID: [name] [name]; [id number]; 18-Oct-1961   Admit date: 08/11/2020 Date of Consult: 08/14/2020  Primary Care Provider: Ignatius Specking, MD Primary Cardiologist: [name] Dell, MD Primary Electrophysiologist: None   Patient Profile:   [name] [name] is a 59 y.o. female with a history of ischemic cardiomyopathy with known occlusion of the diagonal and circumflex managed medically, PAD, previous stroke with occluded right ICA, anticardiolipin antibody positive chronic, hypertension, and hyperlipidemia who is being seen today for the evaluation of acute on chronic systolic heart failure at the request of Dr. Sherryll [name].  History of Present Illness:   [name] [name] is currently hospitalized with acute on chronic systolic heart failure associated with pulmonary edema and fluid overload.  She is responding to IV diuresis from a clinical perspective, urine output has not been accurately recorded however.  She is about 6 to 8 pounds over her baseline weight.  She states that she has been taking her medications regularly, but has noticed increasing leg swelling along with weight gain.  Echocardiogram on August 13 showed decrease in LVEF to the range of 15-20%, previously 25-30% as of January.  RV contraction is normal, there is mild mitral regurgitation as well.  Past Medical History:  Diagnosis Date  . Aneurysm of internal carotid artery   . Anticardiolipin antibody positive    Chronic Coumadin  . Chronic combined systolic (congestive) and diastolic (congestive) heart failure (HCC)   . Cocaine abuse in remission (HCC)   . Coronary atherosclerosis of native coronary artery    Previous invasive cardiac testing was done at a facility in [place], [place].  Occluded diagonal, Diffuse LAD disease, Occluded Cx, and non obstructive RCA.   . Essential hypertension   . Headache   . History of pneumonia   . Ischemic cardiomyopathy    LVEF 30-35%  . Mixed hyperlipidemia    . PVD (peripheral vascular disease) (HCC)   . Raynaud's phenomenon   . Stroke [facility]) 2007   Total occlusion of the right internal carotid    Past Surgical History:  Procedure Laterality Date  . ABDOMINAL AORTAGRAM N/A 05/25/2014   Procedure: ABDOMINAL Ronny Flurry;  Surgeon: Iran Ouch, MD;  Location: [facility] CATH LAB;  Service: Cardiovascular;  Laterality: N/A;  . APPENDECTOMY    . CARDIAC DEFIBRILLATOR PLACEMENT     [employer] ICD  . EP IMPLANTABLE DEVICE N/A 03/01/2016   Procedure: ICD Generator Changeout;  Surgeon: [name], MD;  Location: [facility] INVASIVE CV LAB;  Service: Cardiovascular;  Laterality: N/A;  . L1 corpectomy   12/10  . Laryngeal polyp excision       Inpatient Medications: Scheduled Meds: . aspirin EC  81 mg Oral Daily  . carvedilol  12.5 mg Oral BID WC  . Chlorhexidine Gluconate Cloth  6 each Topical Daily  . famotidine  20 mg Oral Daily  . fluticasone  1 spray Each Nare Daily  . furosemide  40 mg Intravenous BID  . gabapentin  300 mg Oral BID  . guaiFENesin  1,200 mg Oral BID  . mouth rinse  15 mL Mouth Rinse BID  . multivitamin with minerals  1 tablet Oral Daily  . mupirocin ointment  1 application Nasal BID  . sacubitril-valsartan  1 tablet Oral BID  . spironolactone  25 mg Oral Daily  . warfarin  7.5 mg Oral ONCE-1600  . Warfarin - Pharmacist Dosing Inpatient   Does not apply q1600    PRN Meds: acetaminophen, HYDROcodone-acetaminophen, methocarbamol  Allergies:    Allergies  Allergen Reactions  . Beef-Derived Products Anaphylaxis    From tick bite  . Metoclopramide Hcl Anaphylaxis    Non responsive  . Penicillins Anaphylaxis    Pt states she can take any cephalosporin (per pt. 01/01/19) Shock Has patient had a PCN reaction causing immediate rash, facial/tongue/throat swelling, SOB or lightheadedness with hypotension: Yes Has patient had a PCN reaction causing severe rash involving mucus membranes or skin necrosis: No Has patient had a PCN reaction  that required hospitalization Yes Has patient had a PCN reaction occurring within the last 10 years: No If all of the above answers are "NO", then may proceed with Cephalosporin   . Pork-Derived Products Anaphylaxis    From tick bite  . Metoprolol Other (See Comments)    Syncope   . Reglan [Metoclopramide]   . Statins Other (See Comments)    Myalgias    Social History:   Social History   Tobacco Use  . Smoking status: Current Every Day Smoker    Packs/day: 1.00    Years: 43.00    Pack years: 43.00    Types: Cigarettes    Start date: 03/03/1978    Last attempt to quit: 10/03/2013    Years since quitting: 6.8  . Smokeless tobacco: Never Used  . Tobacco comment: resumed cigarettes, stopped last week but continues to vape  Substance Use Topics  . Alcohol use: No    Alcohol/week: 0.0 standard drinks    Family History:   The patient's family history includes Ankylosing spondylitis in her sister; Heart attack in her brother; Heart disease (age of onset: 76) in her father; Stomach cancer (age of onset: 59) in her brother.  ROS:  Please see the history of present illness.  All other ROS reviewed and negative.     Physical Exam/Data:   Vitals:   08/14/20 0700 08/14/20 0800 08/14/20 0900 08/14/20 1000  BP: 111/63 106/69 (!) 79/54 (!) 105/55  Pulse: 82 88 86 81  Resp: 20 (!) 25 20 13   Temp:      TempSrc:      SpO2: (!) 89% 99% 98% 95%  Weight:      Height:        Intake/Output Summary (Last 24 hours) at 08/14/2020 1035 Last data filed at 08/14/2020 1025 Gross per 24 hour  Intake --  Output 1500 ml  Net -1500 ml   Filed Weights   08/11/20 1849 08/12/20 0445 08/13/20 0500  Weight: 107 kg 106.2 kg 106.2 kg   Body mass index is 38.96 kg/m.   Gen: Patient appears comfortable at rest. HEENT: Conjunctiva and lids normal, oropharynx clear with moist mucosa. Neck: Supple, no elevated JVP, no thyromegaly. Lungs: Clear to auscultation, nonlabored breathing at rest. Cardiac:  Indistinct PMI, regular rate and rhythm, no S3, soft systolic murmur, no pericardial rub. Abdomen: Soft, nontender, bowel sounds present. Extremities: Mild lower leg edema, right greater than left, distal pulses 2+. Skin: Warm and dry. Musculoskeletal: No kyphosis. Neuropsychiatric: Alert and oriented x3, affect grossly appropriate.  EKG:  An ECG dated 08/11/2020 was personally reviewed today and demonstrated:  Sinus rhythm with IVCD, old anterior infarct pattern, PVCs.  Telemetry:  I personally reviewed telemetry which shows sinus rhythm.  Laboratory Data:  Chemistry Recent Labs  Lab 08/11/20 0230 08/12/20 0639 08/13/20 0607  NA 134* 136 134*  K 3.3* 4.0 3.8  CL 97* 106 100  CO2 26 21* 26  GLUCOSE 87 96 96  BUN 13  10 12  CREATININE 0.62 0.50 0.62  CALCIUM 9.0 8.5* 8.6*  GFRNONAA >60 >60 >60  GFRAA >60 >60 >60  ANIONGAP 11 9 8     Recent Labs  Lab 08/11/20 0230 08/12/20 0639 08/13/20 0607  PROT 7.3 6.4*  --   ALBUMIN 3.7 3.2* 3.2*  AST 19 18  --   ALT 18 21  --   ALKPHOS 56 45  --   BILITOT 0.5 0.8  --    Hematology Recent Labs  Lab 08/11/20 0230 08/12/20 0639 08/14/20 0439  WBC 11.0* 8.4 8.1  RBC 4.76 4.01 4.18  HGB 14.1 11.9* 12.4  HCT 43.9 37.1 38.7  MCV 92.2 92.5 92.6  MCH 29.6 29.7 29.7  MCHC 32.1 32.1 32.0  RDW 13.4 13.8 13.7  PLT 123* 117* 183   Cardiac Enzymes Recent Labs  Lab 08/11/20 0230 08/11/20 0432  TROPONINIHS 13 12   BNP Recent Labs  Lab 08/11/20 0230  BNP 571.0*    DDimer Recent Labs  Lab 08/11/20 0230  DDIMER 0.96*    Radiology/Studies:  CT Angio Chest PE W/Cm &/Or Wo Cm  Result Date: 08/11/2020 CLINICAL DATA:  Shortness of breath EXAM: CT ANGIOGRAPHY CHEST WITH CONTRAST TECHNIQUE: Multidetector CT imaging of the chest was performed using the standard protocol during bolus administration of intravenous contrast. Multiplanar CT image reconstructions and MIPs were obtained to evaluate the vascular anatomy. CONTRAST:   08/13/2020 OMNIPAQUE IOHEXOL 350 MG/ML SOLN COMPARISON:  None. FINDINGS: Cardiovascular: There is a optimal opacification of the pulmonary arteries. There is no central,segmental, or subsegmental filling defects within the pulmonary arteries. There is moderate cardiomegaly with left ventricular enlargement. No pericardial effusion or thickening. No evidence right heart strain. There is normal three-vessel brachiocephalic anatomy without proximal stenosis. Scattered aortic atherosclerosis is noted. Coronary artery calcifications are seen. A left-sided pacemaker seen with the lead tips at the right atrium and right ventricle. Mediastinum/Nodes: No hilar, mediastinal, or axillary adenopathy. Thyroid gland, trachea, and esophagus demonstrate no significant findings. Lungs/Pleura: Multifocal hazy patchy airspace opacities are seen of the right middle lobe and both lung bases. There are small bilateral pleural effusions. Upper Abdomen: No acute abnormalities present in the visualized portions of the upper abdomen. There is a small hiatal hernia present. Musculoskeletal: No chest wall abnormality. No acute or significant osseous findings. Review of the MIP images confirms the above findings. IMPRESSION: No central, segmental, or subsegmental pulmonary embolism. Multifocal hazy patchy airspace opacities seen at both lower lungs which could be due to asymmetric edema. Small bilateral pleural effusions Moderate cardiomegaly Aortic Atherosclerosis (ICD10-I70.0). Electronically Signed   By: M.D.   On: 08/11/2020 15:45   DG Chest Port 1 View  Result Date: 08/11/2020 CLINICAL DATA:  59 year old female with shortness of breath, left side chest pain. COVID-19 status pending. Smoker. EXAM: PORTABLE CHEST 1 VIEW COMPARISON:  Chest CT 12/31/2018 and earlier. FINDINGS: Portable AP upright view at 0237 hours. Lung volumes and mediastinal contours are stable since last year. Stable left chest cardiac AICD. Visualized tracheal  air column is within normal limits. No pneumothorax or pleural effusion. Coarse bilateral perihilar opacity appears chronic, and largely stable since January of 2020. No definite acute pulmonary opacity. Partially visible chronic lumbar fusion hardware. IMPRESSION: Radiographic appearance of the chest is stable since January 2020. No acute cardiopulmonary abnormality. Electronically Signed   By: February 2020 M.D.   On: 08/11/2020 03:12   DG Chest Port 1V same Day  Result Date: 08/11/2020 CLINICAL DATA:  Shortness of breath with sudden progression. COVID test is negative. EXAM: PORTABLE CHEST 1 VIEW COMPARISON:  One-view chest x-ray 08/11/2020 FINDINGS: The heart is enlarged. Diffuse interstitial pattern has increased. Aortic atherosclerosis is present. A left pleural effusion is noted. Pacing wires are stable. IMPRESSION: 1. Cardiomegaly with increasing interstitial pattern compatible with interstitial edema. 2. Left pleural effusion. Electronically Signed   By: Marin Roberts M.D.   On: 08/11/2020 04:09   ECHOCARDIOGRAM COMPLETE  Result Date: 08/11/2020    ECHOCARDIOGRAM REPORT   Patient Name:   [name] [name] Date of Exam: 08/11/2020 Medical Rec #:  [id number]      Height:       65.0 in Accession #:    [id number]     Weight:       230.0 lb Date of Birth:  10/31/1961      BSA:          2.099 m Patient Age:    59 years       BP:           93/58 mmHg Patient Gender: F              HR:           109 bpm. [name] Procedure: 2D Echo Indications:    CHF-Acute Systolic 428.21 / I50.21  History:        Patient has prior history of Echocardiogram examinations, most                 recent 01/06/2020. Signs/Symptoms:Chest Pain; Risk                 Factors:Dyslipidemia, Hypertension and Current Smoker.                 Peripheral Artery Disease, GERD, Dual implantable                 cardioverter-defibrillator in situ.  Sonographer:    Jeryl Columbia RDCS (AE) Referring Phys: [id number] OLADAPO ADEFESO  IMPRESSIONS  1. Limited visualization difficult to discern more focal wall motion abnormalities. Compared to the Jan 06, 2020 echo there is more prominent global hypokinesis and at least the anteroseptal wall appears akinetic. Marland Kitchen Left ventricular ejection fraction, by  estimation, is 15-20%. The left ventricle has severely decreased function. The left ventricle demonstrates global hypokinesis. Left ventricular diastolic parameters are indeterminate. Elevated left atrial pressure.  2. Right ventricular systolic function is normal. The right ventricular size is normal.  3. Left atrial size was severely dilated.  4. The mitral valve is normal in structure. Mild mitral valve regurgitation. No evidence of mitral stenosis.  5. The aortic valve was not well visualized. Aortic valve regurgitation is not visualized. No aortic stenosis is present.  6. The inferior vena cava is normal in size with greater than 50% respiratory variability, suggesting right atrial pressure of 3 mmHg. FINDINGS  Left Ventricle: Limited visualization difficult to discern more focal wall motion abnormalities. Compared to the Jan 06, 2020 echo there is more prominent global hypokinesis and at least the anteroseptal wall appears akinetic. Left ventricular ejection fraction, by estimation, is 15-20%. The left ventricle has severely decreased function. The left ventricle demonstrates global hypokinesis. The left ventricular internal cavity size was normal in size. There is no left ventricular hypertrophy. Left ventricular diastolic parameters are indeterminate. Elevated left atrial pressure. Right Ventricle: The right ventricular size is normal. No increase in right ventricular wall thickness. Right ventricular systolic function is normal.  Left Atrium: Left atrial size was severely dilated. Right Atrium: Right atrial size was normal in size. Pericardium: Trivial pericardial effusion is present. The pericardial effusion is circumferential. Mitral Valve: The  mitral valve is normal in structure. Mild mitral valve regurgitation. No evidence of mitral valve stenosis. Tricuspid Valve: The tricuspid valve is normal in structure. Tricuspid valve regurgitation is not demonstrated. No evidence of tricuspid stenosis. Aortic Valve: The aortic valve was not well visualized. Aortic valve regurgitation is not visualized. No aortic stenosis is present. Aortic valve mean gradient measures 4.1 mmHg. Aortic valve peak gradient measures 7.7 mmHg. Pulmonic Valve: The pulmonic valve was not well visualized. Pulmonic valve regurgitation is not visualized. No evidence of pulmonic stenosis. Aorta: The aortic root is normal in size and structure. Pulmonary Artery: Indeterminant PASP, inadequate TR jet. Venous: The inferior vena cava is normal in size with greater than 50% respiratory variability, suggesting right atrial pressure of 3 mmHg. IAS/Shunts: The interatrial septum was not well visualized. Additional Comments: A pacer wire is visualized.  LEFT VENTRICLE PLAX 2D LVIDd:         5.89 cm Diastology LVIDs:         5.42 cm LV e' lateral:   4.20 cm/s LV PW:         1.00 cm LV E/e' lateral: 23.6 LV IVS:        1.03 cm LV e' medial:    4.73 cm/s                        LV E/e' medial:  20.9  RIGHT VENTRICLE RV S prime:     8.83 cm/s TAPSE (M-mode): 1.5 cm LEFT ATRIUM             Index       RIGHT ATRIUM           Index LA diam:        5.00 cm 2.38 cm/m  RA Area:     10.10 cm LA Vol (A2C):   99.4 ml 47.35 ml/m RA Volume:   23.60 ml  11.24 ml/m LA Vol (A4C):   94.2 ml 44.87 ml/m LA Biplane Vol: 97.5 ml 46.45 ml/m  AORTIC VALVE AV Vmax:           138.99 cm/s AV Vmean:          95.477 cm/s AV VTI:            0.232 m AV Peak Grad:      7.7 mmHg AV Mean Grad:      4.1 mmHg LVOT Vmax:         86.98 cm/s LVOT Vmean:        53.089 cm/s LVOT VTI:          0.129 m LVOT/AV VTI ratio: 0.56  AORTA Ao Root diam: 2.70 cm MITRAL VALVE MV Area (PHT): 4.46 cm    SHUNTS MV Decel Time: 170 msec    Systemic  VTI: 0.13 m MR Peak grad: 69.2 mmHg MR Mean grad: 44.0 mmHg MR Vmax:      416.00 cm/s MR Vmean:     311.0 cm/s MV E velocity: 99.00 cm/s Dina Rich MD Electronically signed by Dina Rich MD Signature Date/Time: 08/11/2020/5:13:18 PM    Final     Assessment and Plan:   1.  Acute on chronic systolic heart failure with fluid overload.  Responding clinically to IV Lasix.  Weight approximately 60 pounds in the previous baseline  presentation.  2.  Ischemic cardiomyopathy with known occlusion of the diagonal and circumflex managed medically, current LVEF 15-20% range.  She has a Environmental manager ICD in place.  Medical therapy includes Coreg, Lasix, Entresto, and Aldactone.  3.  Positive anticardiolipin antibody, on chronic Coumadin.  Dosing per pharmacy.  4.  Previous history of stroke with occluded RICA.  5.  Mixed hyperlipidemia with statin intolerance.  LDL 94.  Starting Zetia.  Discussed with patient.  Would continue with IV diuresis and observation, likely transfer out to telemetry.  Would like to get her weight closer to baseline prior to discharge.  Continuing Coreg and Entresto at prior doses, reduce Aldactone to 12.5 mg daily.  I did talk with her about ultimately referral to the advanced heart failure clinic as an outpatient, and we can get this set up after discharge.  Need accurate urine output measurements.  Signed, [name] Dell, MD  08/14/2020 10:35 AM

## 2020-08-14 NOTE — Progress Notes (Addendum)
ANTICOAGULATION CONSULT NOTE -   Pharmacy Consult for:  warfarin dosing Indication: anti-cardiolipin antibody positive  Patient Measurements: Height: 5\' 5"  (165.1 cm) Weight: 106.2 kg (234 lb 2.1 oz) IBW/kg (Calculated) : 57 Heparin Dosing Weight: HEPARIN DW (KG): 82  Vital Signs: Temp: 97.9 F (36.6 C) (08/16 0404) BP: 106/69 (08/16 0800) Pulse Rate: 88 (08/16 0800)  Labs: Recent Labs    08/12/20 0639 08/13/20 0607 08/14/20 0439  HGB 11.9*  --  12.4  HCT 37.1  --  38.7  PLT 117*  --  183  APTT 35  --   --   LABPROT 17.4* 18.5* 21.2*  INR 1.5* 1.6* 1.9*  CREATININE 0.50 0.62  --     Estimated Creatinine Clearance: 93.9 mL/min (by C-G formula based on SCr of 0.62 mg/dL).   Medical History: Past Medical History:  Diagnosis Date  . Aneurysm of internal carotid artery   . Anticardiolipin antibody positive    Chronic Coumadin  . Chronic combined systolic (congestive) and diastolic (congestive) heart failure (HCC)   . Cocaine abuse in remission (HCC)   . Coronary atherosclerosis of native coronary artery    Previous invasive cardiac testing was done at a facility in [place], [place].  Occluded diagonal, Diffuse LAD disease, Occluded Cx, and non obstructive RCA.   . Essential hypertension   . Headache   . History of pneumonia   . Ischemic cardiomyopathy    LVEF 30-35%  . Mixed hyperlipidemia   . PVD (peripheral vascular disease) (HCC)   . Raynaud's phenomenon   . Stroke [facility]) 2007   Total occlusion of the right internal carotid    Assessment: Pharmacy consulted to dose warfarin  for this 59 yo female on chronic anti-coagulation with warfarin.  Patient has  history  of being positive  for anti-cardiolipin antibody.  Baseline CBC is WNL.    Home dose: warfarin 10mg  on Sun/Tues/Wed/Fri/Sat                       warfarin 5mg  on Mon and Thurs  Concurrent anti-platelet medication: aspirin 81mg /day Drug interactions: no major interactions at this time   Goal of  Therapy:  INR 2-3 Monitor platelets by anticoagulation protocol: Yes   Plan:  Give  warfarin 7.5 mg x1 dose tonight for INR 1.9 (50% boost over home dose) Daily PT/INR and CBC Monitor patient for s/s of bleeding.  08/14/2020,9:00 AM

## 2020-08-15 ENCOUNTER — Telehealth: Payer: Self-pay

## 2020-08-15 DIAGNOSIS — I255 Ischemic cardiomyopathy: Secondary | ICD-10-CM

## 2020-08-15 LAB — BASIC METABOLIC PANEL
Anion gap: 10 (ref 5–15)
BUN: 18 mg/dL (ref 6–20)
CO2: 27 mmol/L (ref 22–32)
Calcium: 8.8 mg/dL — ABNORMAL LOW (ref 8.9–10.3)
Chloride: 103 mmol/L (ref 98–111)
Creatinine, Ser: 0.58 mg/dL (ref 0.44–1.00)
GFR calc Af Amer: >60 mL/min (ref >60)
GFR calc non Af Amer: >60 mL/min (ref >60)
Glucose, Bld: 95 mg/dL (ref 70–99)
Potassium: 4.4 mmol/L (ref 3.5–5.1)
Sodium: 140 mmol/L (ref 135–145)

## 2020-08-15 LAB — MAGNESIUM: Magnesium: 2.1 mg/dL (ref 1.7–2.4)

## 2020-08-15 LAB — PROTIME-INR
INR: 2.5 — ABNORMAL HIGH (ref 0.8–1.2)
Prothrombin Time: 25.9 seconds — ABNORMAL HIGH (ref 11.4–15.2)

## 2020-08-15 MED ORDER — DIGOXIN 125 MCG PO TABS: 0.1250 mg | ORAL_TABLET | Freq: Every day | ORAL | 3 refills | Status: DC

## 2020-08-15 MED ORDER — EZETIMIBE 10 MG PO TABS: 10.0000 mg | ORAL_TABLET | Freq: Every day | ORAL | 3 refills | Status: DC

## 2020-08-15 MED ORDER — SPIRONOLACTONE 25 MG PO TABS: 12.5000 mg | ORAL_TABLET | Freq: Every day | ORAL | 3 refills | Status: DC

## 2020-08-15 MED ORDER — DIGOXIN 125 MCG PO TABS: 0.1250 mg | ORAL_TABLET | Freq: Every day | ORAL | Status: DC

## 2020-08-15 MED ORDER — TORSEMIDE 20 MG PO TABS: 80.0000 mg | ORAL_TABLET | Freq: Every day | ORAL | 3 refills | Status: DC

## 2020-08-15 MED ORDER — TORSEMIDE 20 MG PO TABS: 80.0000 mg | ORAL_TABLET | Freq: Every day | ORAL | Status: DC

## 2020-08-15 NOTE — Discharge Summary (Signed)
Physician Discharge Summary  [name] [name] ZOX:[id number] DOB: [birth year] DOA: 08/11/2020  PCP: Ignatius Specking, MD  Admit date: 08/11/2020  Discharge date: 08/15/2020  Admitted From:Home  Disposition:  Home  Recommendations for Outpatient Follow-up:  1. Follow up with PCP in 1-2 weeks 2. Follow-up with cardiology at [facility] office on 8/27 as scheduled 3. Patient will have further follow-up with advanced heart failure team with referral that will be sent 4. Continue now on Demadex 80 mg daily instead of Lasix 5. Continue on digoxin as prescribed 6. Continue on Aldactone 12.5 mg daily as opposed to 25 mg previously 7. Patient started on Zetia 8. Continue on other home medications as prior  Home Health: None  Equipment/Devices: None  Discharge Condition: Stable  CODE STATUS: Full  Diet recommendation: Heart Healthy  Brief/Interim Summary: Per HPI: Placida Cambre Hooveris a 59 y.o.femalewith medical history significant forhypertension, hyperlipidemia, anticardiolipin syndrome, chronic combined systolic and diastolic heart failure and tobacco abuse who presents to the emergency department due to shortness of breath that woke her up from sleep this morning. Patient states that she had 2cheese pizza around 2 PM yesterday (8/12) and did not have dinner prior to going to bed. She suddenly woke up gasping for breath and with increasedwork of breathing, so she activated EMS and she was taken to the ED for further evaluation and management. Patient states that she has noted variable weight changes within last 2 weeks with weight ranging from 220-230pounds, however, she endorsed increased abdominal girth with increased leg swelling within last 4 days. Patient also complained of cough which she thinks was due to history of allergy to hay (recently exposed to neighbors hay field). She denies fever, chills,, nausea, vomiting or abdominal pain.  -Patient admitted with acute on chronic systolic  heart failure and has been diuresing quite well over the weekend.  8/16: Patient seen by cardiology with recommendations to continue IV diuresis and observation.  Plan to transfer to telemetry.  Weight is not currently near baseline.  Aldactone reduced to 12.5 mg daily today.  8/17: Patient has diuresed 4.5 L of fluid overnight and vital signs remained stable.  Medication adjustments as noted above with follow-up recommendations.  She is now near her baseline weight which is documented at 228 pounds.  She will continue to monitor her weights at home and understands a plan to follow-up with cardiology as well as heart failure team which will be arranged in the near future.  She is stable for discharge today with no other acute events noted throughout the course of this admission.  Discharge Diagnoses:  Principal Problem:   Acute respiratory failure with hypoxia (HCC) Active Problems:   Hyperlipidemia   Essential hypertension, benign   Anticardiolipin syndrome (HCC)   Dual implantable cardioverter-defibrillator in situ   Chronic combined systolic (congestive) and diastolic (congestive) heart failure (HCC)   Hyponatremia   Hypokalemia   GERD (gastroesophageal reflux disease)   Allergic rhinitis  Principal discharge diagnosis: Acute on chronic systolic congestive heart failure with severe LV dysfunction.  Discharge Instructions  Discharge Instructions    Diet - low sodium heart healthy   Complete by: As directed    Increase activity slowly   Complete by: As directed      Allergies as of 08/15/2020      Reactions   Beef-derived Products Anaphylaxis   From tick bite   Metoclopramide Hcl Anaphylaxis   Non responsive   Penicillins Anaphylaxis   Pt states she can take any  cephalosporin (per pt. 01/01/19) Shock Has patient had a PCN reaction causing immediate rash, facial/tongue/throat swelling, SOB or lightheadedness with hypotension: Yes Has patient had a PCN reaction causing severe  rash involving mucus membranes or skin necrosis: No Has patient had a PCN reaction that required hospitalization Yes Has patient had a PCN reaction occurring within the last 10 years: No If all of the above answers are "NO", then may proceed with Cephalosporin    Pork-derived Products Anaphylaxis   From tick bite   Metoprolol Other (See Comments)   Syncope   Reglan [metoclopramide]    Statins Other (See Comments)   Myalgias      Medication List    STOP taking these medications   furosemide 40 MG tablet Commonly known as: LASIX   furosemide 80 MG tablet Commonly known as: LASIX     TAKE these medications   aspirin EC 81 MG tablet Take 81 mg by mouth daily. Swallow whole.   carvedilol 12.5 MG tablet Commonly known as: COREG TAKE 1 TABLET BY MOUTH TWICE DAILY WITH MEALS   digoxin 0.125 MG tablet Commonly known as: LANOXIN Take 1 tablet (0.125 mg total) by mouth daily.   Entresto 24-26 MG Generic drug: sacubitril-valsartan Take 1 tablet by mouth 2 (two) times daily.   ezetimibe 10 MG tablet Commonly known as: ZETIA Take 1 tablet (10 mg total) by mouth daily. Start taking on: August 16, 2020   famotidine 20 MG tablet Commonly known as: PEPCID Take 1 tablet (20 mg total) by mouth 2 (two) times daily. What changed: when to take this   fluticasone 50 MCG/ACT nasal spray Commonly known as: FLONASE Place 1 spray into both nostrils daily as needed for allergies.   gabapentin 300 MG capsule Commonly known as: NEURONTIN Take 300 mg by mouth 2 (two) times daily.   guaiFENesin 600 MG 12 hr tablet Commonly known as: MUCINEX Take 2 tablets (1,200 mg total) by mouth 2 (two) times daily.   HYDROcodone-acetaminophen 10-325 MG tablet Commonly known as: NORCO Take 1 tablet by mouth 4 (four) times daily as needed.   methocarbamol 750 MG tablet Commonly known as: ROBAXIN Take 750 mg by mouth every 8 (eight) hours as needed for muscle spasms.   multivitamins ther.  w/minerals Tabs tablet Take 1 tablet by mouth daily.   nitroGLYCERIN 0.4 MG SL tablet Commonly known as: NITROSTAT PLACE 1 TABLET UNDER THE TONGUE EVERY 5 MINUTES FOR 3 DOSES AS NEEDED CHEST PAIN   potassium chloride SA 20 MEQ tablet Commonly known as: KLOR-CON TAKE 1 TABLET BY MOUTH EVERY DAY   promethazine 25 MG tablet Commonly known as: PHENERGAN Take 25 mg by mouth every 6 (six) hours as needed for nausea. With pain medication   saccharomyces boulardii 250 MG capsule Commonly known as: FLORASTOR Take 250 mg by mouth 2 (two) times daily.   spironolactone 25 MG tablet Commonly known as: ALDACTONE Take 0.5 tablets (12.5 mg total) by mouth daily. Start taking on: August 16, 2020 What changed: how much to take   torsemide 20 MG tablet Commonly known as: DEMADEX Take 4 tablets (80 mg total) by mouth daily.   warfarin 10 MG tablet Commonly known as: COUMADIN Take as directed. If you are unsure how to take this medication, talk to your nurse or doctor. Original instructions: TAKE 1 TABLET BY MOUTH EVERY DAY EXCEPT 1/2 TABLET ON MONDAYS AND THURSDAYS       Follow-up Information    Netta Neat., NP Follow  up on 08/25/2020.   Specialty: Cardiology Why: [facility] Follow-up on 08/25/2020 at 9:30 AM. Please call the office if needing a different date or time.  Contact information: [address] [place] [address] [phone]        Ignatius Specking, MD Follow up in 1 week(s).   Specialty: Internal Medicine Contact information: [address] [place] [address] [phone]              Allergies  Allergen Reactions  . Beef-Derived Products Anaphylaxis    From tick bite  . Metoclopramide Hcl Anaphylaxis    Non responsive  . Penicillins Anaphylaxis    Pt states she can take any cephalosporin (per pt. 01/01/19) Shock Has patient had a PCN reaction causing immediate rash, facial/tongue/throat swelling, SOB or lightheadedness with  hypotension: Yes Has patient had a PCN reaction causing severe rash involving mucus membranes or skin necrosis: No Has patient had a PCN reaction that required hospitalization Yes Has patient had a PCN reaction occurring within the last 10 years: No If all of the above answers are "NO", then may proceed with Cephalosporin   . Pork-Derived Products Anaphylaxis    From tick bite  . Metoprolol Other (See Comments)    Syncope   . Reglan [Metoclopramide]   . Statins Other (See Comments)    Myalgias    Consultations:  Cardiology   Procedures/Studies: CT Angio Chest PE W/Cm &/Or Wo Cm  Result Date: 08/11/2020 CLINICAL DATA:  Shortness of breath EXAM: CT ANGIOGRAPHY CHEST WITH CONTRAST TECHNIQUE: Multidetector CT imaging of the chest was performed using the standard protocol during bolus administration of intravenous contrast. Multiplanar CT image reconstructions and MIPs were obtained to evaluate the vascular anatomy. CONTRAST:  OMNIPAQUE IOHEXOL 350 MG/ML SOLN COMPARISON:  None. FINDINGS: Cardiovascular: There is a optimal opacification of the pulmonary arteries. There is no central,segmental, or subsegmental filling defects within the pulmonary arteries. There is moderate cardiomegaly with left ventricular enlargement. No pericardial effusion or thickening. No evidence right heart strain. There is normal three-vessel brachiocephalic anatomy without proximal stenosis. Scattered aortic atherosclerosis is noted. Coronary artery calcifications are seen. A left-sided pacemaker seen with the lead tips at the right atrium and right ventricle. Mediastinum/Nodes: No hilar, mediastinal, or axillary adenopathy. Thyroid gland, trachea, and esophagus demonstrate no significant findings. Lungs/Pleura: Multifocal hazy patchy airspace opacities are seen of the right middle lobe and both lung bases. There are small bilateral pleural effusions. Upper Abdomen: No acute abnormalities present in the visualized  portions of the upper abdomen. There is a small hiatal hernia present. Musculoskeletal: No chest wall abnormality. No acute or significant osseous findings. Review of the MIP images confirms the above findings. IMPRESSION: No central, segmental, or subsegmental pulmonary embolism. Multifocal hazy patchy airspace opacities seen at both lower lungs which could be due to asymmetric edema. Small bilateral pleural effusions Moderate cardiomegaly Aortic Atherosclerosis (ICD10-I70.0). Electronically Signed   By: Jonna Clark M.D.   On: 08/11/2020 15:45   DG Chest Port 1 View  Result Date: 08/11/2020 CLINICAL DATA:  59 year old female with shortness of breath, left side chest pain. COVID-19 status pending. Smoker. EXAM: PORTABLE CHEST 1 VIEW COMPARISON:  Chest CT 12/31/2018 and earlier. FINDINGS: Portable AP upright view at 0237 hours. Lung volumes and mediastinal contours are stable since last year. Stable left chest cardiac AICD. Visualized tracheal air column is within normal limits. No pneumothorax or pleural effusion. Coarse bilateral perihilar opacity appears chronic, and largely stable since  January of 2020. No definite acute pulmonary opacity. Partially visible chronic lumbar fusion hardware. IMPRESSION: Radiographic appearance of the chest is stable since January 2020. No acute cardiopulmonary abnormality. Electronically Signed   By: Odessa Fleming M.D.   On: 08/11/2020 03:12   DG Chest Port 1V same Day  Result Date: 08/11/2020 CLINICAL DATA:  Shortness of breath with sudden progression. COVID test is negative. EXAM: PORTABLE CHEST 1 VIEW COMPARISON:  One-view chest x-ray 08/11/2020 FINDINGS: The heart is enlarged. Diffuse interstitial pattern has increased. Aortic atherosclerosis is present. A left pleural effusion is noted. Pacing wires are stable. IMPRESSION: 1. Cardiomegaly with increasing interstitial pattern compatible with interstitial edema. 2. Left pleural effusion. Electronically Signed   By: Marin Roberts M.D.   On: 08/11/2020 04:09   ECHOCARDIOGRAM COMPLETE  Result Date: 08/11/2020    ECHOCARDIOGRAM REPORT   Patient Name:   [name] [name] Date of Exam: 08/11/2020 Medical Rec #:  [id number]      Height:       65.0 in Accession #:    [id number]     Weight:       230.0 lb Date of Birth:  04-May-1961      BSA:          2.099 m Patient Age:    59 years       BP:           93/58 mmHg Patient Gender: F              HR:           109 bpm. [name] Procedure: 2D Echo Indications:    CHF-Acute Systolic 428.21 / I50.21  History:        Patient has prior history of Echocardiogram examinations, most                 recent 01/06/2020. Signs/Symptoms:Chest Pain; Risk                 Factors:Dyslipidemia, Hypertension and Current Smoker.                 Peripheral Artery Disease, GERD, Dual implantable                 cardioverter-defibrillator in situ.  Sonographer:    Jeryl Columbia RDCS (AE) Referring Phys: [id number] OLADAPO ADEFESO IMPRESSIONS  1. Limited visualization difficult to discern more focal wall motion abnormalities. Compared to the Jan 06, 2020 echo there is more prominent global hypokinesis and at least the anteroseptal wall appears akinetic. Marland Kitchen Left ventricular ejection fraction, by  estimation, is 15-20%. The left ventricle has severely decreased function. The left ventricle demonstrates global hypokinesis. Left ventricular diastolic parameters are indeterminate. Elevated left atrial pressure.  2. Right ventricular systolic function is normal. The right ventricular size is normal.  3. Left atrial size was severely dilated.  4. The mitral valve is normal in structure. Mild mitral valve regurgitation. No evidence of mitral stenosis.  5. The aortic valve was not well visualized. Aortic valve regurgitation is not visualized. No aortic stenosis is present.  6. The inferior vena cava is normal in size with greater than 50% respiratory variability, suggesting right atrial pressure of 3 mmHg.  FINDINGS  Left Ventricle: Limited visualization difficult to discern more focal wall motion abnormalities. Compared to the Jan 06, 2020 echo there is more prominent global hypokinesis and at least the anteroseptal wall appears akinetic. Left ventricular ejection fraction, by estimation, is 15-20%. The  left ventricle has severely decreased function. The left ventricle demonstrates global hypokinesis. The left ventricular internal cavity size was normal in size. There is no left ventricular hypertrophy. Left ventricular diastolic parameters are indeterminate. Elevated left atrial pressure. Right Ventricle: The right ventricular size is normal. No increase in right ventricular wall thickness. Right ventricular systolic function is normal. Left Atrium: Left atrial size was severely dilated. Right Atrium: Right atrial size was normal in size. Pericardium: Trivial pericardial effusion is present. The pericardial effusion is circumferential. Mitral Valve: The mitral valve is normal in structure. Mild mitral valve regurgitation. No evidence of mitral valve stenosis. Tricuspid Valve: The tricuspid valve is normal in structure. Tricuspid valve regurgitation is not demonstrated. No evidence of tricuspid stenosis. Aortic Valve: The aortic valve was not well visualized. Aortic valve regurgitation is not visualized. No aortic stenosis is present. Aortic valve mean gradient measures 4.1 mmHg. Aortic valve peak gradient measures 7.7 mmHg. Pulmonic Valve: The pulmonic valve was not well visualized. Pulmonic valve regurgitation is not visualized. No evidence of pulmonic stenosis. Aorta: The aortic root is normal in size and structure. Pulmonary Artery: Indeterminant PASP, inadequate TR jet. Venous: The inferior vena cava is normal in size with greater than 50% respiratory variability, suggesting right atrial pressure of 3 mmHg. IAS/Shunts: The interatrial septum was not well visualized. Additional Comments: A pacer wire is visualized.   LEFT VENTRICLE PLAX 2D LVIDd:         5.89 cm Diastology LVIDs:         5.42 cm LV e' lateral:   4.20 cm/s LV PW:         1.00 cm LV E/e' lateral: 23.6 LV IVS:        1.03 cm LV e' medial:    4.73 cm/s                        LV E/e' medial:  20.9  RIGHT VENTRICLE RV S prime:     8.83 cm/s TAPSE (M-mode): 1.5 cm LEFT ATRIUM             Index       RIGHT ATRIUM           Index LA diam:        5.00 cm 2.38 cm/m  RA Area:     10.10 cm LA Vol (A2C):   99.4 ml 47.35 ml/m RA Volume:   23.60 ml  11.24 ml/m LA Vol (A4C):   94.2 ml 44.87 ml/m LA Biplane Vol: 97.5 ml 46.45 ml/m  AORTIC VALVE AV Vmax:           138.99 cm/s AV Vmean:          95.477 cm/s AV VTI:            0.232 m AV Peak Grad:      7.7 mmHg AV Mean Grad:      4.1 mmHg LVOT Vmax:         86.98 cm/s LVOT Vmean:        53.089 cm/s LVOT VTI:          0.129 m LVOT/AV VTI ratio: 0.56  AORTA Ao Root diam: 2.70 cm MITRAL VALVE MV Area (PHT): 4.46 cm    SHUNTS MV Decel Time: 170 msec    Systemic VTI: 0.13 m MR Peak grad: 69.2 mmHg MR Mean grad: 44.0 mmHg MR Vmax:      416.00 cm/s MR Vmean:     311.0  cm/s MV E velocity: 99.00 cm/s Dina Rich MD Electronically signed by Dina Rich MD Signature Date/Time: 08/11/2020/5:13:18 PM    Final      Discharge Exam: Vitals:   08/15/20 0801 08/15/20 0846  BP:  103/88  Pulse:  72  Resp: 17   Temp: (!) 96.6 F (35.9 C)   SpO2:     Vitals:   08/15/20 0500 08/15/20 0539 08/15/20 0801 08/15/20 0846  BP:  121/81  103/88  Pulse:    72  Resp:  20 17   Temp:   (!) 96.6 F (35.9 C)   TempSrc:   Axillary   SpO2:      Weight: 103.6 kg     Height:        General: Pt is alert, awake, not in acute distress Cardiovascular: RRR, S1/S2 +, no rubs, no gallops Respiratory: CTA bilaterally, no wheezing, no rhonchi Abdominal: Soft, NT, ND, bowel sounds + Extremities: no edema, no cyanosis    The results of significant diagnostics from this hospitalization (including imaging, microbiology, ancillary and  laboratory) are listed below for reference.     Microbiology: Recent Results (from the past 240 hour(s))  SARS Coronavirus 2 by RT PCR (hospital order, performed in [facility] hospital lab) Nasopharyngeal Nasopharyngeal Swab     Status: None   Collection Time: 08/11/20  2:40 AM   Specimen: Nasopharyngeal Swab  Result Value Ref Range Status   SARS Coronavirus 2 NEGATIVE NEGATIVE Final    Comment: (NOTE) SARS-CoV-2 target nucleic acids are NOT DETECTED.  The SARS-CoV-2 RNA is generally detectable in upper and lower respiratory specimens during the acute phase of infection. The lowest concentration of SARS-CoV-2 viral copies this assay can detect is 250 copies / mL. A negative result does not preclude SARS-CoV-2 infection and should not be used as the sole basis for treatment or other patient management decisions.  A negative result may occur with improper specimen collection / handling, submission of specimen other than nasopharyngeal swab, presence of viral mutation(s) within the areas targeted by this assay, and inadequate number of viral copies (<250 copies / mL). A negative result must be combined with clinical observations, patient history, and epidemiological information.  Fact Sheet for Patients:   BoilerBrush.com.cy  Fact Sheet for Healthcare Providers: https://pope.com/  This test is not yet approved or  cleared by the [place] FDA and has been authorized for detection and/or diagnosis of SARS-CoV-2 by FDA under an Emergency Use Authorization (EUA).  This EUA will remain in effect (meaning this test can be used) for the duration of the COVID-19 declaration under Section 564(b)(1) of the Act, 21 U.S.C. section 360bbb-3(b)(1), unless the authorization is terminated or revoked sooner.  Performed at [facility], [address], [place], [place] [address]   MRSA PCR Screening     Status: Abnormal   Collection Time:  08/11/20  6:48 PM   Specimen: Nasopharyngeal  Result Value Ref Range Status   MRSA by PCR POSITIVE (A) NEGATIVE Final    Comment:        The GeneXpert MRSA Assay (FDA approved for NASAL specimens only), is one component of a comprehensive MRSA colonization surveillance program. It is not intended to diagnose MRSA infection nor to guide or monitor treatment for MRSA infections. CRITICAL RESULT CALLED TO, READ BACK BY AND VERIFIED WITH: Angela Nevin @[id number]  08/12/2020 KAY Performed at [facility], [address], [place], [place]      Labs: BNP (last 3 results) Recent Labs  08/11/20 0230  BNP 571.0*   Basic Metabolic Panel: Recent Labs  Lab 08/11/20 0230 08/11/20 0432 08/12/20 0639 08/13/20 0607 08/14/20 1525 08/15/20 0514  NA 134*  --  136 134* 137 140  K 3.3*  --  4.0 3.8 4.0 4.4  CL 97*  --  106 100 101 103  CO2 26  --  21* GLUCOSE 87  --  96 96 82 95  BUN 13  --  CREATININE 0.62  --  0.50 0.62 0.62 0.58  CALCIUM 9.0  --  8.5* 8.6* 8.6* 8.8*  MG  --  2.0 2.0 1.9  --  2.1  PHOS  --  4.3 2.7 3.3  --   --    Liver Function Tests: Recent Labs  Lab 08/11/20 0230 08/12/20 0639 08/13/20 0607  AST 19 18  --   ALT 18 21  --   ALKPHOS 56 45  --   BILITOT 0.5 0.8  --   PROT 7.3 6.4*  --   ALBUMIN 3.7 3.2* 3.2*   No results for input(s): LIPASE, AMYLASE in the last 168 hours. No results for input(s): AMMONIA in the last 168 hours. CBC: Recent Labs  Lab 08/11/20 0230 08/12/20 0639 08/14/20 0439  WBC 11.0* 8.4 8.1  NEUTROABS 8.2*  --   --   HGB 14.1 11.9* 12.4  HCT 43.9 37.1 38.7  MCV 92.2 92.5 92.6  PLT 123* 117* 183   Cardiac Enzymes: No results for input(s): CKTOTAL, CKMB, CKMBINDEX, TROPONINI in the last 168 hours. BNP: Invalid input(s): POCBNP CBG: No results for input(s): GLUCAP in the last 168 hours. D-Dimer No results for input(s): DDIMER in the last 72 hours. Hgb A1c No results for input(s): HGBA1C  in the last 72 hours. Lipid Profile No results for input(s): CHOL, HDL, LDLCALC, TRIG, CHOLHDL, LDLDIRECT in the last 72 hours. Thyroid function studies No results for input(s): TSH, T4TOTAL, T3FREE, THYROIDAB in the last 72 hours.  Invalid input(s): FREET3 Anemia work up No results for input(s): VITAMINB12, FOLATE, FERRITIN, TIBC, IRON, RETICCTPCT in the last 72 hours. Urinalysis    Component Value Date/Time   COLORURINE COLORLESS (A) 12/31/2018 1315   APPEARANCEUR CLEAR 12/31/2018 1315   LABSPEC 1.005 12/31/2018 1315   PHURINE 6.0 12/31/2018 1315   GLUCOSEU NEGATIVE 12/31/2018 1315   HGBUR NEGATIVE 12/31/2018 1315   BILIRUBINUR NEGATIVE 12/31/2018 1315   KETONESUR NEGATIVE 12/31/2018 1315   PROTEINUR NEGATIVE 12/31/2018 1315   UROBILINOGEN 0.2 12/23/2011 1440   NITRITE NEGATIVE 12/31/2018 1315   LEUKOCYTESUR NEGATIVE 12/31/2018 1315   Sepsis Labs Invalid input(s): PROCALCITONIN,  WBC,  LACTICIDVEN Microbiology Recent Results (from the past 240 hour(s))  SARS Coronavirus 2 by RT PCR (hospital order, performed in [facility] hospital lab) Nasopharyngeal Nasopharyngeal Swab     Status: None   Collection Time: 08/11/20  2:40 AM   Specimen: Nasopharyngeal Swab  Result Value Ref Range Status   SARS Coronavirus 2 NEGATIVE NEGATIVE Final    Comment: (NOTE) SARS-CoV-2 target nucleic acids are NOT DETECTED.  The SARS-CoV-2 RNA is generally detectable in upper and lower respiratory specimens during the acute phase of infection. The lowest concentration of SARS-CoV-2 viral copies this assay can detect is 250 copies / mL. A negative result does not preclude SARS-CoV-2 infection and should not be used as the sole basis for treatment or other patient management decisions.  A negative result may occur with improper specimen collection / handling, submission  of specimen other than nasopharyngeal swab, presence of viral mutation(s) within the areas targeted by this assay, and inadequate  number of viral copies (<250 copies / mL). A negative result must be combined with clinical observations, patient history, and epidemiological information.  Fact Sheet for Patients:   BoilerBrush.com.cy  Fact Sheet for Healthcare Providers: https://pope.com/  This test is not yet approved or  cleared by the [place] FDA and has been authorized for detection and/or diagnosis of SARS-CoV-2 by FDA under an Emergency Use Authorization (EUA).  This EUA will remain in effect (meaning this test can be used) for the duration of the COVID-19 declaration under Section 564(b)(1) of the Act, 21 U.S.C. section 360bbb-3(b)(1), unless the authorization is terminated or revoked sooner.  Performed at [facility], [address] [place], [place] [address]   MRSA PCR Screening     Status: Abnormal   Collection Time: 08/11/20  6:48 PM   Specimen: Nasopharyngeal  Result Value Ref Range Status   MRSA by PCR POSITIVE (A) NEGATIVE Final    Comment:        The GeneXpert MRSA Assay (FDA approved for NASAL specimens only), is one component of a comprehensive MRSA colonization surveillance program. It is not intended to diagnose MRSA infection nor to guide or monitor treatment for MRSA infections. CRITICAL RESULT CALLED TO, READ BACK BY AND VERIFIED WITH: Angela Nevin @[id number]  08/12/2020 KAY Performed at [facility], [address], [place], [place] [address]      Time coordinating discharge:35 minutes  SIGNED:   Erick Blinks, DO [facility] 08/15/2020, 9:57 AM  If 7PM-7AM, please contact night-coverage [email or web address]

## 2020-08-15 NOTE — Progress Notes (Signed)
Discharge information provided to patient. Patient verbalized understanding of discharge instructions and had no further questions.

## 2020-08-15 NOTE — Progress Notes (Signed)
ANTICOAGULATION CONSULT NOTE -   Pharmacy Consult for:  warfarin dosing Indication: anti-cardiolipin antibody positive  Patient Measurements: Height: 5\' 5"  (165.1 cm) Weight: 103.6 kg (228 lb 6.3 oz) IBW/kg (Calculated) : 57 Heparin Dosing Weight: HEPARIN DW (KG): 82  Vital Signs: Temp: 98.1 F (36.7 C) (08/17 0400) Temp Source: Oral (08/17 0400) BP: 121/81 (08/17 0539)  Labs: Recent Labs    08/13/20 0607 08/14/20 0439 08/14/20 1525 08/15/20 0514  HGB  --  12.4  --   --   HCT  --  38.7  --   --   PLT  --  183  --   --   LABPROT 18.5* 21.2*  --  25.9*  INR 1.6* 1.9*  --  2.5*  CREATININE 0.62  --  0.62 0.58    Estimated Creatinine Clearance: 92.6 mL/min (by C-G formula based on SCr of 0.58 mg/dL).   Medical History: Past Medical History:  Diagnosis Date  . Aneurysm of internal carotid artery   . Anticardiolipin antibody positive    Chronic Coumadin  . Chronic combined systolic (congestive) and diastolic (congestive) heart failure (HCC)   . Cocaine abuse in remission (HCC)   . Coronary atherosclerosis of native coronary artery    Previous invasive cardiac testing was done at a facility in [place].  Occluded diagonal, Diffuse LAD disease, Occluded Cx, and non obstructive RCA.   . Essential hypertension   . Headache   . History of pneumonia   . Ischemic cardiomyopathy    LVEF 30-35%  . Mixed hyperlipidemia   . PVD (peripheral vascular disease) (HCC)   . Raynaud's phenomenon   . Stroke [facility]) 2007   Total occlusion of the right internal carotid    Assessment: Pharmacy consulted to dose warfarin  for this 59 yo female on chronic anti-coagulation with warfarin.  Patient has  history  of being positive  for anti-cardiolipin antibody.  Baseline CBC is WNL.    Home dose: warfarin 10mg  on Sun/Tues/Wed/Fri/Sat                       warfarin 5mg  on Mon and Thurs  Concurrent anti-platelet medication: aspirin 81mg /day Drug interactions: no major interactions  at this time   Goal of Therapy:  INR 2-3 Monitor platelets by anticoagulation protocol: Yes   Plan:  Give  warfarin 5 mg x1 dose tonight for INR 2.5 Daily PT/INR and CBC Monitor patient for s/s of bleeding.  Chelcie Estorga 08/15/2020,7:12 AM

## 2020-08-15 NOTE — Telephone Encounter (Signed)
Per Dr.McDowell, referral placed to Advanced heart failure Clinic

## 2020-08-15 NOTE — Progress Notes (Signed)
Progress Note  Patient Name: [name] [name] Date of Encounter: 08/15/2020  Primary Cardiologist: Nona Dell, MD  Subjective   States that she does much better today, would like to go home.  No chest pain or shortness of breath at rest.  Inpatient Medications    Scheduled Meds: . aspirin EC  81 mg Oral Daily  . carvedilol  12.5 mg Oral BID WC  . Chlorhexidine Gluconate Cloth  6 each Topical Daily  . ezetimibe  10 mg Oral Daily  . famotidine  20 mg Oral Daily  . fluticasone  1 spray Each Nare Daily  . furosemide  40 mg Intravenous BID  . gabapentin  300 mg Oral BID  . guaiFENesin  1,200 mg Oral BID  . mouth rinse  15 mL Mouth Rinse BID  . multivitamin with minerals  1 tablet Oral Daily  . mupirocin ointment  1 application Nasal BID  . sacubitril-valsartan  1 tablet Oral BID  . spironolactone  12.5 mg Oral Daily  . Warfarin - Pharmacist Dosing Inpatient   Does not apply q1600    PRN Meds: HYDROcodone-acetaminophen, methocarbamol, ondansetron (ZOFRAN) IV   Vital Signs    Vitals:   08/15/20 0500 08/15/20 0539 08/15/20 0801 08/15/20 0846  BP:  121/81  103/88  Pulse:    72  Resp:  20 17   Temp:   (!) 96.6 F (35.9 C)   TempSrc:   Axillary   SpO2:      Weight: 103.6 kg     Height:        Intake/Output Summary (Last 24 hours) at 08/15/2020 0905 Last data filed at 08/15/2020 0853 Gross per 24 hour  Intake --  Output 4700 ml  Net -4700 ml   Filed Weights   08/12/20 0445 08/13/20 0500 08/15/20 0500  Weight: 106.2 kg 106.2 kg 103.6 kg    Telemetry    Sinus rhythm.  Personally reviewed.  ECG    An ECG dated 08/11/2020 was personally reviewed today and demonstrated:  Sinus rhythm with IVCD, old anterior infarct pattern, PVCs.  Physical Exam   GEN: No acute distress.   Neck: No JVD. Cardiac:  Indistinct PMI, RRR without gallop, soft systolic murmur.Marland Kitchen  Respiratory:  Clear, no wheezing. GI: Soft, nontender, bowel sounds present. MS:  Improved lower legs  edema; No deformity. Neuro:  Nonfocal. Psych: Alert and oriented x 3. Normal affect.  Labs    Chemistry Recent Labs  Lab 08/11/20 0230 08/11/20 0230 08/12/20 0639 08/12/20 0639 08/13/20 0607 08/14/20 1525 08/15/20 0514  NA 134*   < > 136   < > 134* 137 140  K 3.3*   < > 4.0   < > 3.8 4.0 4.4  CL 97*   < > 106   < > 100 101 103  CO2 26   < > 21*   < > 26 25 27   GLUCOSE 87   < > 96   < > 96 82 95  BUN 13   < > 10   < > 12 15 18   CREATININE 0.62   < > 0.50   < > 0.62 0.62 0.58  CALCIUM 9.0   < > 8.5*   < > 8.6* 8.6* 8.8*  PROT 7.3  --  6.4*  --   --   --   --   ALBUMIN 3.7  --  3.2*  --  3.2*  --   --   AST 19  --  18  --   --   --   --  ALT 18  --  21  --   --   --   --   ALKPHOS 56  --  45  --   --   --   --   BILITOT 0.5  --  0.8  --   --   --   --   GFRNONAA >60   < > >60   < > >60 >60 >60  GFRAA >60   < > >60   < > >60 >60 >60  ANIONGAP 11   < > 9   < > 8 11 10    < > = values in this interval not displayed.     Hematology Recent Labs  Lab 08/11/20 0230 08/12/20 0639 08/14/20 0439  WBC 11.0* 8.4 8.1  RBC 4.76 4.01 4.18  HGB 14.1 11.9* 12.4  HCT 43.9 37.1 38.7  MCV 92.2 92.5 92.6  MCH 29.6 29.7 29.7  MCHC 32.1 32.1 32.0  RDW 13.4 13.8 13.7  PLT 123* 117* 183    Cardiac Enzymes Recent Labs  Lab 08/11/20 0230 08/11/20 0432  TROPONINIHS 13 12    BNP Recent Labs  Lab 08/11/20 0230  BNP 571.0*     DDimer Recent Labs  Lab 08/11/20 0230  DDIMER 0.96*     Radiology    No results found.  Cardiac Studies   Echocardiogram 08/11/2020: 1. Limited visualization difficult to discern more focal wall motion  abnormalities. Compared to the Jan 06, 2020 echo there is more prominent  global hypokinesis and at least the anteroseptal wall appears akinetic. Jan 08, 2020  Left ventricular ejection fraction, by  estimation, is 15-20%. The left ventricle has severely decreased  function. The left ventricle demonstrates global hypokinesis. Left  ventricular  diastolic parameters are indeterminate. Elevated left atrial  pressure.  2. Right ventricular systolic function is normal. The right ventricular  size is normal.  3. Left atrial size was severely dilated.  4. The mitral valve is normal in structure. Mild mitral valve  regurgitation. No evidence of mitral stenosis.  5. The aortic valve was not well visualized. Aortic valve regurgitation  is not visualized. No aortic stenosis is present.  6. The inferior vena cava is normal in size with greater than 50%  respiratory variability, suggesting right atrial pressure of 3 mmHg.   Patient Profile     59 y.o. female with a history of ischemic cardiomyopathy with known occlusion of the diagonal and circumflex managed medically, PAD, previous stroke with occluded right ICA, anticardiolipin antibody positive chronic, hypertension, and hyperlipidemia.  Assessment & Plan    1.  Acute on chronic systolic heart failure, clinically improved with IV Lasix.  She diuresed an additional 4500 cc last 24 hours and weight is back down toward baseline.  2.  Ischemic cardiomyopathy with known occlusion of the diagonal and circumflex, managed medically.  LVEF recently 15 to 20% range.  [employer] ICD in place without device shocks or syncope.  3.  Positive anticardiolipin antibody, continues on chronic Coumadin.  4.  Prior history of stroke with occluded RICA.  5.  Mixed hyperlipidemia with statin intolerance.  Started on Zetia with recent LDL 94.  Anticipate discharge home today.  Would continue aspirin, Coumadin, Coreg, Zetia, Entresto, and Aldactone at current doses.  We will add Lanoxin 0.125 mg daily and would also suggest changing from outpatient Lasix to Demadex 80 mg daily.  We will plan to set up a follow-up in our [place] office in the next 7 to 10 days with  BMET, and also make a referral to advanced heart failure team for consultation.  Signed, Nona Dell, MD  08/15/2020, 9:05 AM

## 2020-08-17 DIAGNOSIS — I729 Aneurysm of unspecified site: Secondary | ICD-10-CM | POA: Diagnosis not present

## 2020-08-17 DIAGNOSIS — Z299 Encounter for prophylactic measures, unspecified: Secondary | ICD-10-CM | POA: Diagnosis not present

## 2020-08-17 DIAGNOSIS — I779 Disorder of arteries and arterioles, unspecified: Secondary | ICD-10-CM | POA: Diagnosis not present

## 2020-08-17 DIAGNOSIS — I5022 Chronic systolic (congestive) heart failure: Secondary | ICD-10-CM | POA: Diagnosis not present

## 2020-08-17 DIAGNOSIS — D6861 Antiphospholipid syndrome: Secondary | ICD-10-CM | POA: Diagnosis not present

## 2020-08-22 DIAGNOSIS — M545 Low back pain: Secondary | ICD-10-CM | POA: Diagnosis not present

## 2020-08-22 DIAGNOSIS — Z79891 Long term (current) use of opiate analgesic: Secondary | ICD-10-CM | POA: Diagnosis not present

## 2020-08-22 DIAGNOSIS — M25569 Pain in unspecified knee: Secondary | ICD-10-CM | POA: Diagnosis not present

## 2020-08-22 DIAGNOSIS — G8912 Acute post-thoracotomy pain: Secondary | ICD-10-CM | POA: Diagnosis not present

## 2020-08-24 NOTE — Progress Notes (Signed)
.    Cardiology Office Note  Date: 08/25/2020   ID: [name] [name], DOB 10-16-1961, MRN [id number]  PCP:  Ignatius Specking, MD  Cardiologist:  Nona Dell, MD Electrophysiologist:  None   Chief Complaint: Follow-up ischemic cardiomyopathy and hospital follow-up  History of Present Illness: [name] [name] is a 59 y.o. female with a history of ischemic cardiomyopathy, [employer] ICD, hyper coagulable state with anticardiolipin antibody, bilateral carotid artery disease, systolic heart failure  Last encounter with Dr. Diona Browner on 05/18/2020.  She had no significant anginal symptoms with stable NYHA class II dyspnea.  Weight loss was discussed.  She has been trying to be more active by walking dogs for exercise.  She was seeing Dr. Ladona Ridgel for device management of her ICD.  Recent device interrogation did not show any shocks but did have some atrial episodes suggestive of atrial flutter and NSVT.  She continued on Coumadin.  She was to continue known  Coreg, Entresto, Aldactone and Lasix with potassium supplement for ischemic cardiomyopathy.  Known chronic occlusion of R ICA.  She remained asymptomatic.  Recent admission 08/11/2020 with presentation to the emergency room with shortness of breath that woke her up from sleep.  She states she woke up gasping for breath with increased work of breathing.  She had noted variable weight changing ranging from 220 to 230 pounds.  She had increased abdominal girth with increased leg swelling within the prior 4 days.  She was admitted with acute on chronic heart failure and diuresed well.  On 08/15/2020 she had diuresed 4.5 L of fluid overnight.  She was near baseline weight which was 228 pounds.  She was referred to the advanced heart failure clinic.  Inpatient echo showed severely decreased LV function with EF of 15 to 20%.  Here for hospital follow-up today.  States she feels much better since being discharged from the hospital.  States her shortness of breath  is significantly improved.  Her weight on arrival on our scales was 234 pounds.  She states on her scales today her weight was 228 pounds which she describes as her baseline weight.  She states her abdominal girth has significantly decreased since being diuresed and discharged.  She is currently taking 80 mg of torsemide daily.  She states she is not urinating very much.  She denies any significant exertional dyspnea.  She states she can do all of her housework without any significant symptoms.  States she is having occasional palpitations but not bothersome.  She states during the hospital stay she was having a fair amount of arrhythmias in the ICU.  Blood pressure is soft today 100/60.  She states she will be following up with her PCP soon.  She mentions the fact that she has a carotid artery aneurysm and follows with a vascular specialist.  She denies any orthostatic symptoms, CVA or TIA-like symptoms, PND, orthopnea, lower extremity swelling.  Past Medical History:  Diagnosis Date  . Aneurysm of internal carotid artery   . Anticardiolipin antibody positive    Chronic Coumadin  . Chronic combined systolic (congestive) and diastolic (congestive) heart failure (HCC)   . Cocaine abuse in remission (HCC)   . Coronary atherosclerosis of native coronary artery    Previous invasive cardiac testing was done at a facility in [place], [place].  Occluded diagonal, Diffuse LAD disease, Occluded Cx, and non obstructive RCA.   . Essential hypertension   . Headache   . History of pneumonia   .  Ischemic cardiomyopathy    LVEF 30-35%  . Mixed hyperlipidemia   . PVD (peripheral vascular disease) (HCC)   . Raynaud's phenomenon   . Stroke [facility]) 2007   Total occlusion of the right internal carotid    Past Surgical History:  Procedure Laterality Date  . ABDOMINAL AORTAGRAM N/A 05/25/2014   Procedure: ABDOMINAL Ronny Flurry;  Surgeon: Iran Ouch, MD;  Location: [facility] CATH LAB;  Service: Cardiovascular;   Laterality: N/A;  . APPENDECTOMY    . CARDIAC DEFIBRILLATOR PLACEMENT     [employer].Jude ICD  . EP IMPLANTABLE DEVICE N/A 03/01/2016   Procedure: ICD Generator Changeout;  Surgeon: [name], MD;  Location: [facility] INVASIVE CV LAB;  Service: Cardiovascular;  Laterality: N/A;  . L1 corpectomy   12/10  . Laryngeal polyp excision      Current Outpatient Medications  Medication Sig Dispense Refill  . aspirin EC 81 MG tablet Take 81 mg by mouth daily. Swallow whole.    . carvedilol (COREG) 12.5 MG tablet TAKE 1 TABLET BY MOUTH TWICE DAILY WITH MEALS 180 tablet 3  . digoxin (LANOXIN) 0.125 MG tablet Take 1 tablet (0.125 mg total) by mouth daily. 30 tablet 3  . ezetimibe (ZETIA) 10 MG tablet Take 1 tablet (10 mg total) by mouth daily. 30 tablet 3  . famotidine (PEPCID) 20 MG tablet Take 1 tablet (20 mg total) by mouth 2 (two) times daily. (Patient taking differently: Take 20 mg by mouth daily. ) 60 tablet 1  . fluticasone (FLONASE) 50 MCG/ACT nasal spray Place 1 spray into both nostrils daily as needed for allergies.     Marland Kitchen gabapentin (NEURONTIN) 300 MG capsule Take 300 mg by mouth 2 (two) times daily.    Marland Kitchen HYDROcodone-acetaminophen (NORCO) 10-325 MG tablet Take 1 tablet by mouth 4 (four) times daily as needed.    . methocarbamol (ROBAXIN) 750 MG tablet Take 750 mg by mouth every 8 (eight) hours as needed for muscle spasms.    . Multiple Vitamins-Minerals (MULTIVITAMINS THER. W/MINERALS) TABS Take 1 tablet by mouth daily.      . nitroGLYCERIN (NITROSTAT) 0.4 MG SL tablet PLACE 1 TABLET UNDER THE TONGUE EVERY 5 MINUTES FOR 3 DOSES AS NEEDED CHEST PAIN 25 tablet 3  . potassium chloride SA (KLOR-CON) 20 MEQ tablet TAKE 1 TABLET BY MOUTH EVERY DAY 90 tablet 1  . promethazine (PHENERGAN) 25 MG tablet Take 25 mg by mouth every 6 (six) hours as needed for nausea. With pain medication    . saccharomyces boulardii (FLORASTOR) 250 MG capsule Take 250 mg by mouth 2 (two) times daily.    . sacubitril-valsartan  (ENTRESTO) 24-26 MG Take 1 tablet by mouth 2 (two) times daily. 180 tablet 3  . spironolactone (ALDACTONE) 25 MG tablet Take 0.5 tablets (12.5 mg total) by mouth daily. 15 tablet 3  . torsemide (DEMADEX) 20 MG tablet Take 4 tablets (80 mg total) by mouth daily. 120 tablet 3  . warfarin (COUMADIN) 10 MG tablet TAKE 1 TABLET BY MOUTH EVERY DAY EXCEPT 1/2 TABLET ON MONDAYS AND THURSDAYS 30 tablet 3   No current facility-administered medications for this visit.   Allergies:  Beef-derived products, Metoclopramide hcl, Penicillins, Pork-derived products, Metoprolol, Reglan [metoclopramide], and Statins   Social History: The patient  reports that she has been smoking cigarettes. She started smoking about 42 years ago. She has a 43.00 pack-year smoking history. She has never used smokeless tobacco. She reports that she does not drink alcohol and does not use drugs.  Family History: The patient's family history includes Ankylosing spondylitis in her sister; Heart attack in her brother; Heart disease (age of onset: 39) in her father; Stomach cancer (age of onset: 43) in her brother.   ROS:  Please see the history of present illness. Otherwise, complete review of systems is positive for none.  All other systems are reviewed and negative.   Physical Exam: VS:  BP 100/60   Pulse 66   Ht 5' 5.5" (1.664 m)   Wt 234 lb (106.1 kg)   SpO2 95%   BMI 38.35 kg/m , BMI Body mass index is 38.35 kg/m.  Wt Readings from Last 3 Encounters:  08/25/20 234 lb (106.1 kg)  08/15/20 228 lb 6.3 oz (103.6 kg)  05/18/20 228 lb (103.4 kg)    General: Patient appears comfortable at rest. Neck: Supple, no elevated JVP or carotid bruits, no thyromegaly. Lungs: Clear to auscultation, nonlabored breathing at rest. Cardiac: Regular rate and rhythm, no S3 or significant systolic murmur, no pericardial rub. Extremities: No pitting edema, distal pulses 2+. Skin: Warm and dry. Musculoskeletal: No kyphosis. Neuropsychiatric:  Alert and oriented x3, affect grossly appropriate.  ECG:  EKG in the [facility] ED on 08/11/2020 showed sinus tachycardia with a rate of 108, paired ventricular premature complexes, anteroseptal infarct, old.  Abnormal T, consider ischemia in the lateral leads  Recent Labwork: 08/11/2020: B Natriuretic Peptide 571.0 08/12/2020: ALT 21; AST 18 08/14/2020: Hemoglobin 12.4; Platelets 183 08/15/2020: BUN 18; Creatinine, Ser 0.58; Magnesium 2.1; Potassium 4.4; Sodium 140     Component Value Date/Time   CHOL 148 08/12/2020 0639   TRIG 80 08/12/2020 0639   HDL 38 (L) 08/12/2020 0639   CHOLHDL 3.9 08/12/2020 0639   VLDL 16 08/12/2020 0639   LDLCALC 94 08/12/2020 0639    Other Studies Reviewed Today:  Echo 08/11/2020  1. Limited visualization difficult to discern more focal wall motion abnormalities. Compared to the Jan 06, 2020 echo there is more prominent global hypokinesis and at least the anteroseptal wall appears akinetic. Marland Kitchen Left ventricular ejection fraction, by estimation, is 15- 20%. The left ventricle has severely decreased function. The left ventricle demonstrates global hypokinesis. Left ventricular diastolic parameters are indeterminate. Elevated left atrial pressure. 2. Right ventricular systolic function is normal. The right ventricular size is normal. 3. Left atrial size was severely dilated. 4. The mitral valve is normal in structure. Mild mitral valve regurgitation. No evidence of mitral stenosis. 5. The aortic valve was not well visualized. Aortic valve regurgitation is not visualized. No aortic stenosis is present. 6. The inferior vena cava is normal in size with greater than 50% respiratory variability, suggesting right atrial pressure of 3 mmHg.    Echocardiogram 01/06/2020: 1. Left ventricular ejection fraction, by visual estimation, is 25 to 30%. The left ventricle has severely decreased function. There is no left ventricular hypertrophy. 2. Elevated left atrial  pressure. 3. Left ventricular diastolic parameters are consistent with Grade I diastolic dysfunction (impaired relaxation). 4. Severely dilated left ventricular internal cavity size. 5. The left ventricle demonstrates global hypokinesis. 6. Global right ventricle has normal systolic function.The right ventricular size is normal. No increase in right ventricular wall thickness. 7. Left atrial size was severely dilated. 8. Right atrial size was normal. 9. The mitral valve is normal in structure. Moderate mitral valve regurgitation. No evidence of mitral stenosis. 10. The tricuspid valve is normal in structure. 11. The aortic valve has an indeterminant number of cusps. Aortic valve regurgitation is not visualized. No  evidence of aortic valve sclerosis or stenosis. 12. The pulmonic valve was not well visualized. Pulmonic valve regurgitation is not visualized. 13. Normal pulmonary artery systolic pressure. 14. A pacer wire is visualized. 15. The inferior vena cava is normal in size with greater than 50% respiratory variability, suggesting right atrial pressure of 3 mmHg.   Assessment and Plan:  1. Cardiomyopathy, ischemic   2. ICD (implantable cardioverter-defibrillator) in place   3. Hypercoagulable state (HCC)   4. Bilateral carotid artery stenosis    1. Cardiomyopathy, ischemic/ systolic heart failure Recent echocardiogram showed decreased EF of 15 to 20%.  Status post recent hospital admission for systolic heart failure.  Treated with IV diuresis with good results of 4.5 L fluid loss.  Weight today is 234 by our scales patient states her weight at home this morning was 228 which is her baseline per her statement.  Continue torsemide 80 mg daily.  Patient states she does not seem to be urinating as much as she was when she was taking Lasix.  Continue digoxin 0.125 mg daily, Entresto 24/26 mg p.o. twice daily.  Spironolactone 12.5 mg p.o. daily.  She has a pending appointment with  advanced heart failure clinic.  Blood pressure is soft today at 100/60.  2. ICD (implantable cardioverter-defibrillator) in place [facility] Jude ICD.  No recent device shocks or syncope.  She is following with Dr. Ladona Ridgel.  3. Hypercoagulable state (HCC) anticardiolipin antibody Continue Coumadin 10 mg daily except 1/2 tablet on Mondays and Thursdays.  No bleeding noted.  4. Bilateral carotid artery stenosis Bilateral carotid artery disease with known occlusion of the right ICA.  She is asymptomatic.   Medication Adjustments/Labs and Tests Ordered: Current medicines are reviewed at length with the patient today.  Concerns regarding medicines are outlined above.   Disposition: Follow-up with Dr. Diona Browner in October at scheduled appointment.  Signed, Rennis Harding, NP 08/25/2020 9:39 AM    [facility] at [facility] [address] [place], [place], [place] [address] Phone: [phone]; Fax: [phone]

## 2020-08-25 ENCOUNTER — Other Ambulatory Visit: Payer: Self-pay

## 2020-08-25 ENCOUNTER — Encounter: Payer: Self-pay | Admitting: Family Medicine

## 2020-08-25 ENCOUNTER — Ambulatory Visit: Payer: Medicare PPO | Admitting: Family Medicine

## 2020-08-25 VITALS — BP 100/60 | HR 66 | Ht 65.5 in | Wt 234.0 lb

## 2020-08-25 DIAGNOSIS — Z95810 Presence of automatic (implantable) cardiac defibrillator: Secondary | ICD-10-CM

## 2020-08-25 DIAGNOSIS — I255 Ischemic cardiomyopathy: Secondary | ICD-10-CM | POA: Diagnosis not present

## 2020-08-25 DIAGNOSIS — D6859 Other primary thrombophilia: Secondary | ICD-10-CM

## 2020-08-25 DIAGNOSIS — I6523 Occlusion and stenosis of bilateral carotid arteries: Secondary | ICD-10-CM | POA: Diagnosis not present

## 2020-08-25 NOTE — Patient Instructions (Signed)
Medication Instructions:  Continue all current medications.  Labwork: none  Testing/Procedures: none  Follow-Up: Keep already scheduled follow up for October with Dr. Diona Browner   Any Other Special Instructions Will Be Listed Below (If Applicable).  If you need a refill on your cardiac medications before your next appointment, please call your pharmacy.

## 2020-08-29 ENCOUNTER — Ambulatory Visit (INDEPENDENT_AMBULATORY_CARE_PROVIDER_SITE_OTHER): Payer: Medicare PPO | Admitting: *Deleted

## 2020-08-29 DIAGNOSIS — Z299 Encounter for prophylactic measures, unspecified: Secondary | ICD-10-CM | POA: Diagnosis not present

## 2020-08-29 DIAGNOSIS — D6859 Other primary thrombophilia: Secondary | ICD-10-CM | POA: Diagnosis not present

## 2020-08-29 DIAGNOSIS — I779 Disorder of arteries and arterioles, unspecified: Secondary | ICD-10-CM | POA: Diagnosis not present

## 2020-08-29 DIAGNOSIS — D6861 Antiphospholipid syndrome: Secondary | ICD-10-CM | POA: Diagnosis not present

## 2020-08-29 DIAGNOSIS — Z5181 Encounter for therapeutic drug level monitoring: Secondary | ICD-10-CM | POA: Diagnosis not present

## 2020-08-29 DIAGNOSIS — I63549 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: Secondary | ICD-10-CM

## 2020-08-29 DIAGNOSIS — Z95810 Presence of automatic (implantable) cardiac defibrillator: Secondary | ICD-10-CM | POA: Diagnosis not present

## 2020-08-29 DIAGNOSIS — I5022 Chronic systolic (congestive) heart failure: Secondary | ICD-10-CM | POA: Diagnosis not present

## 2020-08-29 LAB — POCT INR: INR: 2.7 (ref 2.0–3.0)

## 2020-08-29 NOTE — Patient Instructions (Signed)
Continue taking 1 tablet daily except 1/2 tablet on Mondays and Thursdays Has been diagnosed with Alpha Gal.   Keep greens consistant Recheck in 6 weeks 

## 2020-09-01 ENCOUNTER — Ambulatory Visit (INDEPENDENT_AMBULATORY_CARE_PROVIDER_SITE_OTHER): Payer: Medicare PPO | Admitting: *Deleted

## 2020-09-01 DIAGNOSIS — I255 Ischemic cardiomyopathy: Secondary | ICD-10-CM

## 2020-09-03 LAB — CUP PACEART REMOTE DEVICE CHECK
Battery Remaining Longevity: 114 mo
Battery Remaining Percentage: 100 %
Brady Statistic RA Percent Paced: 0 %
Brady Statistic RV Percent Paced: 0 %
Date Time Interrogation Session: 20210903025300
HighPow Impedance: 50 Ohm
Implantable Lead Implant Date: 20081119
Implantable Lead Implant Date: 20081119
Implantable Lead Location: 753859
Implantable Lead Location: 753860
Implantable Lead Model: 185
Implantable Lead Model: 4136
Implantable Lead Serial Number: 211124
Implantable Lead Serial Number: 28383796
Implantable Pulse Generator Implant Date: 20170303
Lead Channel Impedance Value: 440 Ohm
Lead Channel Impedance Value: 487 Ohm
Lead Channel Pacing Threshold Amplitude: 0.5 V
Lead Channel Pacing Threshold Amplitude: 1 V
Lead Channel Pacing Threshold Pulse Width: 0.5 ms
Lead Channel Pacing Threshold Pulse Width: 0.5 ms
Lead Channel Setting Pacing Amplitude: 2 V
Lead Channel Setting Pacing Amplitude: 2.4 V
Lead Channel Setting Pacing Pulse Width: 0.5 ms
Lead Channel Setting Sensing Sensitivity: 0.6 mV
Pulse Gen Serial Number: 204398

## 2020-09-05 NOTE — Progress Notes (Signed)
Remote ICD transmission.   

## 2020-09-07 ENCOUNTER — Telehealth: Payer: Self-pay

## 2020-09-07 NOTE — Telephone Encounter (Signed)
Reports constant, aching pain in entire torso last night rated 8/10 that lasted 30-40 minutes. Also reports that she had n/v 's 3 and felt heart racing. Reports taking nitroglycerin x's 1, sat in chair for one hour and symptoms all resolved. Denies sweating or SOB. Denies active chest pain or any other symptoms at this time. Offered sooner appointment with APP but declined by patient and agrees to keep VV scheduled for 09/21/20 with Diona Browner. Advised if symptoms return or get worse, to go to the ED for an evaluation. Verbalized understanding of plan.

## 2020-09-07 NOTE — Telephone Encounter (Signed)
New message    Pt c/o of Chest Pain: STAT if CP now or developed within 24 hours  1. Are you having CP right now? no  2. Are you experiencing any other symptoms (ex. SOB, nausea, vomiting, sweating)? Patient states she has chest pain the other night , with nausea, vomitting, sweating   3. How long have you been experiencing CP? Comes and goes since she was released from hospital   4. Is your CP continuous or coming and going? Comes and goes   5. Have you taken Nitroglycerin? Yes she took nitro and the symptoms went away, she made virtual visit for 9/23 to discuss this with Dr Diona Browner, she did not want appt with APP for earlier  ?

## 2020-09-20 ENCOUNTER — Telehealth: Payer: Self-pay | Admitting: Cardiology

## 2020-09-20 NOTE — Telephone Encounter (Signed)
  Patient Consent for Virtual Visit         [name] [name] has provided verbal consent on 09/20/2020 for a virtual visit (video or telephone).   CONSENT FOR VIRTUAL VISIT FOR:  [name] [name]  By participating in this virtual visit I agree to the following:  I hereby voluntarily request, consent and authorize [facility] and its employed or contracted physicians, physician assistants, nurse practitioners or other licensed health care professionals (the Practitioner), to provide me with telemedicine health care services (the "Services") as deemed necessary by the treating Practitioner. I acknowledge and consent to receive the Services by the Practitioner via telemedicine. I understand that the telemedicine visit will involve communicating with the Practitioner through live audiovisual communication technology and the disclosure of certain medical information by electronic transmission. I acknowledge that I have been given the opportunity to request an in-person assessment or other available alternative prior to the telemedicine visit and am voluntarily participating in the telemedicine visit.  I understand that I have the right to withhold or withdraw my consent to the use of telemedicine in the course of my care at any time, without affecting my right to future care or treatment, and that the Practitioner or I may terminate the telemedicine visit at any time. I understand that I have the right to inspect all information obtained and/or recorded in the course of the telemedicine visit and may receive copies of available information for a reasonable fee.  I understand that some of the potential risks of receiving the Services via telemedicine include:  Marland Kitchen Delay or interruption in medical evaluation due to technological equipment failure or disruption; . Information transmitted may not be sufficient (e.g. poor resolution of images) to allow for appropriate medical decision making by the Practitioner;  and/or  . In rare instances, security protocols could fail, causing a breach of personal health information.  Furthermore, I acknowledge that it is my responsibility to provide information about my medical history, conditions and care that is complete and accurate to the best of my ability. I acknowledge that Practitioner's advice, recommendations, and/or decision may be based on factors not within their control, such as incomplete or inaccurate data provided by me or distortions of diagnostic images or specimens that may result from electronic transmissions. I understand that the practice of medicine is not an exact science and that Practitioner makes no warranties or guarantees regarding treatment outcomes. I acknowledge that a copy of this consent can be made available to me via my patient portal [facility] MyChart), or I can request a printed copy by calling the office of [facility].    I understand that my insurance will be billed for this visit.   I have read or had this consent read to me. . I understand the contents of this consent, which adequately explains the benefits and risks of the Services being provided via telemedicine.  . I have been provided ample opportunity to ask questions regarding this consent and the Services and have had my questions answered to my satisfaction. . I give my informed consent for the services to be provided through the use of telemedicine in my medical care

## 2020-09-21 ENCOUNTER — Encounter: Payer: Self-pay | Admitting: Cardiology

## 2020-09-21 ENCOUNTER — Telehealth (INDEPENDENT_AMBULATORY_CARE_PROVIDER_SITE_OTHER): Payer: Medicare PPO | Admitting: Cardiology

## 2020-09-21 VITALS — BP 107/86 | HR 84 | Ht 66.0 in | Wt 222.0 lb

## 2020-09-21 DIAGNOSIS — I5022 Chronic systolic (congestive) heart failure: Secondary | ICD-10-CM | POA: Diagnosis not present

## 2020-09-21 DIAGNOSIS — I255 Ischemic cardiomyopathy: Secondary | ICD-10-CM

## 2020-09-21 MED ORDER — TORSEMIDE 20 MG PO TABS: 80.0000 mg | ORAL_TABLET | Freq: Every day | ORAL | 3 refills | Status: DC

## 2020-09-21 NOTE — Progress Notes (Signed)
Virtual Visit via Telephone Note   This visit type was conducted due to national recommendations for restrictions regarding the COVID-19 Pandemic (e.g. social distancing) in an effort to limit this patient's exposure and mitigate transmission in our community.  Due to her co-morbid illnesses, this patient is at least at moderate risk for complications without adequate follow up.  This format is felt to be most appropriate for this patient at this time.  The patient did not have access to video technology/had technical difficulties with video requiring transitioning to audio format only (telephone).  All issues noted in this document were discussed and addressed.  No physical exam could be performed with this format.  Please refer to the patient's chart for her  consent to telehealth for [facility].    Date:  09/21/2020   ID:  [name] [name], DOB Sep 10, 1961, MRN [id number] The patient was identified using 2 identifiers.  Patient Location: Home Provider Location: Office/Clinic  PCP:  Ignatius Specking, MD  Cardiologist:  Nona Dell, MD Electrophysiologist:  None   Evaluation Performed:  Follow-Up Visit  Chief Complaint:   Cardiac follow-up  History of Present Illness:    [name] [name] is a 59 y.o. female last seen in August by Mr. Vincenza Hews NP. We spoke by phone today. She describes an episode of chest pain that she had back in early September, lasted for nearly an hour, felt like her heart was racing but she did not have any syncope or device shock. She took a nitroglycerin which did not help immediately, but symptoms eventually resolved without other intervention.  She states that her weight fluctuates a few pounds during the day, she has been taking an extra Demadex on mornings when her weight is up 3 pounds. Her weight reported today is actually down from previous assessment. She still awaits evaluation in the [facility], I asked my nurse to check on the  referral.  She sees Dr. Ladona Ridgel, [facility], [place] [employer] ICD in place.  Most recent device check showed normal function, no device shocks, brief episodes of NSVT.  Current cardiac medications are reviewed below. She states that she has been taking them regularly. Tends to run a low to low normal blood pressure, sometimes feels dizzy but has not had any falls or syncope.   Past Medical History:  Diagnosis Date  . Aneurysm of internal carotid artery   . Anticardiolipin antibody positive    Chronic Coumadin  . Chronic combined systolic (congestive) and diastolic (congestive) heart failure (HCC)   . Cocaine abuse in remission (HCC)   . Coronary atherosclerosis of native coronary artery    Previous invasive cardiac testing was done at a facility in [place], [place].  Occluded diagonal, Diffuse LAD disease, Occluded Cx, and non obstructive RCA.   . Essential hypertension   . Headache   . History of pneumonia   . Ischemic cardiomyopathy    LVEF 30-35%  . Mixed hyperlipidemia   . PVD (peripheral vascular disease) (HCC)   . Raynaud's phenomenon   . Stroke [facility]) 2007   Total occlusion of the right internal carotid   Past Surgical History:  Procedure Laterality Date  . ABDOMINAL AORTAGRAM N/A 05/25/2014   Procedure: ABDOMINAL Ronny Flurry;  Surgeon: Iran Ouch, MD;  Location: [facility] CATH LAB;  Service: Cardiovascular;  Laterality: N/A;  . APPENDECTOMY    . CARDIAC DEFIBRILLATOR PLACEMENT     [employer] ICD  . EP IMPLANTABLE DEVICE N/A 03/01/2016   Procedure: ICD Generator  Changeout;  Surgeon: [name], MD;  Location: [facility] INVASIVE CV LAB;  Service: Cardiovascular;  Laterality: N/A;  . L1 corpectomy   12/10  . Laryngeal polyp excision       Current Meds  Medication Sig  . aspirin EC 81 MG tablet Take 81 mg by mouth daily. Swallow whole.  . carvedilol (COREG) 12.5 MG tablet TAKE 1 TABLET BY MOUTH TWICE DAILY WITH MEALS  . digoxin (LANOXIN) 0.125 MG tablet Take 1 tablet (0.125 mg total) by  mouth daily.  Marland Kitchen ezetimibe (ZETIA) 10 MG tablet Take 1 tablet (10 mg total) by mouth daily.  . famotidine (PEPCID) 20 MG tablet Take 1 tablet (20 mg total) by mouth 2 (two) times daily. (Patient taking differently: Take 20 mg by mouth daily. )  . fluticasone (FLONASE) 50 MCG/ACT nasal spray Place 1 spray into both nostrils daily as needed for allergies.   Marland Kitchen gabapentin (NEURONTIN) 300 MG capsule Take 300 mg by mouth 2 (two) times daily.  . methocarbamol (ROBAXIN) 750 MG tablet Take 750 mg by mouth every 8 (eight) hours as needed for muscle spasms.  . Multiple Vitamins-Minerals (MULTIVITAMINS THER. W/MINERALS) TABS Take 1 tablet by mouth daily.    . nitroGLYCERIN (NITROSTAT) 0.4 MG SL tablet PLACE 1 TABLET UNDER THE TONGUE EVERY 5 MINUTES FOR 3 DOSES AS NEEDED CHEST PAIN  . oxyCODONE-acetaminophen (PERCOCET) 10-325 MG tablet Take 1 tablet by mouth every 4 (four) hours as needed for pain.  . potassium chloride SA (KLOR-CON) 20 MEQ tablet TAKE 1 TABLET BY MOUTH EVERY DAY  . promethazine (PHENERGAN) 25 MG tablet Take 25 mg by mouth every 6 (six) hours as needed for nausea. With pain medication  . saccharomyces boulardii (FLORASTOR) 250 MG capsule Take 250 mg by mouth 2 (two) times daily.  . sacubitril-valsartan (ENTRESTO) 24-26 MG Take 1 tablet by mouth 2 (two) times daily.  Marland Kitchen spironolactone (ALDACTONE) 25 MG tablet Take 0.5 tablets (12.5 mg total) by mouth daily.  Marland Kitchen torsemide (DEMADEX) 20 MG tablet Take 4 tablets (80 mg total) by mouth daily. May take extra tablet as needed  . warfarin (COUMADIN) 10 MG tablet TAKE 1 TABLET BY MOUTH EVERY DAY EXCEPT 1/2 TABLET ON MONDAYS AND THURSDAYS  . [DISCONTINUED] HYDROcodone-acetaminophen (NORCO) 10-325 MG tablet Take 1 tablet by mouth 4 (four) times daily as needed.  . [DISCONTINUED] torsemide (DEMADEX) 20 MG tablet Take 4 tablets (80 mg total) by mouth daily.     Allergies:   Beef-derived products, Metoclopramide hcl, Penicillins, Pork-derived products,  Metoprolol, Reglan [metoclopramide], and Statins   ROS:   No syncope.  Prior CV studies:   The following studies were reviewed today:  Echocardiogram 08/11/2020: 1. Limited visualization difficult to discern more focal wall motion  abnormalities. Compared to the Jan 06, 2020 echo there is more prominent  global hypokinesis and at least the anteroseptal wall appears akinetic. Marland Kitchen  Left ventricular ejection fraction, by  estimation, is 15-20%. The left ventricle has severely decreased  function. The left ventricle demonstrates global hypokinesis. Left  ventricular diastolic parameters are indeterminate. Elevated left atrial  pressure.  2. Right ventricular systolic function is normal. The right ventricular  size is normal.  3. Left atrial size was severely dilated.  4. The mitral valve is normal in structure. Mild mitral valve  regurgitation. No evidence of mitral stenosis.  5. The aortic valve was not well visualized. Aortic valve regurgitation  is not visualized. No aortic stenosis is present.  6. The inferior vena cava  is normal in size with greater than 50%  respiratory variability, suggesting right atrial pressure of 3 mmHg.   Labs/Other Tests and Data Reviewed:    EKG:  An ECG dated 08/11/2020 was personally reviewed today and demonstrated:  Sinus tachycardia with old anterior infarct pattern and PVCs.  Recent Labs: 08/11/2020: B Natriuretic Peptide 571.0 08/12/2020: ALT 21 08/14/2020: Hemoglobin 12.4; Platelets 183 08/15/2020: BUN 18; Creatinine, Ser 0.58; Magnesium 2.1; Potassium 4.4; Sodium 140   Recent Lipid Panel Lab Results  Component Value Date/Time   CHOL 148 08/12/2020 06:39 AM   TRIG 80 08/12/2020 06:39 AM   HDL 38 (L) 08/12/2020 06:39 AM   CHOLHDL 3.9 08/12/2020 06:39 AM   LDLCALC 94 08/12/2020 06:39 AM    Wt Readings from Last 3 Encounters:  09/21/20 222 lb (100.7 kg)  08/25/20 234 lb (106.1 kg)  08/15/20 228 lb 6.3 oz (103.6 kg)     Objective:     Vital Signs:  BP 107/86   Pulse 84   Ht 5\' 6"  (1.676 m)   Wt 222 lb (100.7 kg)   BMI 35.83 kg/m    Patient spoke in complete sentences, not short of breath on the phone.  ASSESSMENT & PLAN:    1. Ischemic cardiomyopathy with known occlusion of the diagonal and circumflex that has been managed medically over time. Most recent ejection fraction 15 to 20% as of August. [employer] ICD in place with no recent device shocks or syncope. She is on low-dose aspirin, beta-blocker, and Zetia with statin intolerance.  2. Chronic systolic heart failure. Weight is down compared to last assessment. She is currently on Coreg, Lanoxin, Entresto, Aldactone, and Demadex with potassium supplement. Prescription updated so that she has extra Demadex as needed. Check BMET. She awaits evaluation in the [facility].  3. Positive anticardiolipin antibody. She has a previous history of stroke with occluded RICA. Continue Coumadin.   Time:   Today, I have spent 11 minutes with the patient with telehealth technology discussing the above problems.     Medication Adjustments/Labs and Tests Ordered: Current medicines are reviewed at length with the patient today.  Concerns regarding medicines are outlined above.   Tests Ordered: Orders Placed This Encounter  Procedures  . Basic Metabolic Panel (BMET)    Medication Changes: Meds ordered this encounter  Medications  . torsemide (DEMADEX) 20 MG tablet    Sig: Take 4 tablets (80 mg total) by mouth daily. May take extra tablet as needed    Dispense:  135 tablet    Refill:  3    Follow Up:  In Person 2 months in the [place] office.  Signed, [place], MD  09/21/2020 11:49 AM    [facility]

## 2020-09-21 NOTE — Patient Instructions (Signed)
Your physician recommends that you schedule a follow-up appointment in: 2 MONTHS WITH DR MCDOWELL  Your physician recommends that you continue on your current medications as directed. Please refer to the Current Medication list given to you today.  WE HAVE REFILLED TORSEMIDE SO THAT YOU MAY TAKE EXTRA AS DIRECTED   Your physician recommends that you return for lab work [facility]   Thank you for choosing [facility]!!

## 2020-10-03 ENCOUNTER — Ambulatory Visit: Payer: Medicare PPO | Admitting: Cardiology

## 2020-10-09 ENCOUNTER — Other Ambulatory Visit: Payer: Self-pay | Admitting: Cardiology

## 2020-10-10 ENCOUNTER — Ambulatory Visit (INDEPENDENT_AMBULATORY_CARE_PROVIDER_SITE_OTHER): Payer: Medicare PPO | Admitting: *Deleted

## 2020-10-10 DIAGNOSIS — Z79891 Long term (current) use of opiate analgesic: Secondary | ICD-10-CM | POA: Diagnosis not present

## 2020-10-10 DIAGNOSIS — I63549 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: Secondary | ICD-10-CM | POA: Diagnosis not present

## 2020-10-10 DIAGNOSIS — D6861 Antiphospholipid syndrome: Secondary | ICD-10-CM

## 2020-10-10 DIAGNOSIS — D6859 Other primary thrombophilia: Secondary | ICD-10-CM

## 2020-10-10 DIAGNOSIS — Z5181 Encounter for therapeutic drug level monitoring: Secondary | ICD-10-CM

## 2020-10-10 DIAGNOSIS — M25569 Pain in unspecified knee: Secondary | ICD-10-CM | POA: Diagnosis not present

## 2020-10-10 DIAGNOSIS — G8912 Acute post-thoracotomy pain: Secondary | ICD-10-CM | POA: Diagnosis not present

## 2020-10-10 DIAGNOSIS — M545 Low back pain, unspecified: Secondary | ICD-10-CM | POA: Diagnosis not present

## 2020-10-10 LAB — POCT INR: INR: 4.1 — AB (ref 2.0–3.0)

## 2020-10-10 NOTE — Patient Instructions (Signed)
Has increased pain pill to 4 x day Hold warfarin tonight then decrease dose to 1/2 tablet daily except 1 tablets on Sundays, Tuesdays and Thursdays Has been diagnosed with Alpha Gal.   Keep greens consistant Recheck in 3 weeks

## 2020-10-19 ENCOUNTER — Telehealth: Payer: Self-pay | Admitting: Orthopaedic Surgery

## 2020-10-19 NOTE — Telephone Encounter (Signed)
Called back to patient, relayed.  She further relays that due to her health issues, primarily heart condition, she has a medical assessment every 3 years as well as a vision exam. States Dr [name] is the last provider who had done it for her. She relays that she has a "medical hold" on her driver's license as she is "not supposed to drive" " which is an issue for her".  She said she has reviewed this over and over with the DMV, and that there is not a form or documentation she can send; just would need for Dr Hilda Lias to say there is no limitation to the use of her left arm. I relayed to patient that Dr Sanjuan Dame response will likely be the same. She would like to have it run by Dr Hilda Lias again.

## 2020-10-19 NOTE — Telephone Encounter (Signed)
Patient called today to follow up on a letter from the orthopaedic specialist, that she states "is the only thing that the [facility] is waiting on" - please review and advise, regarding letter patient states she needs is due to her history of fractured elbow, which she said is to indicate that she may drive and has no restrictions.

## 2020-10-19 NOTE — Telephone Encounter (Signed)
I have no idea what she is talking about.  I have never had to give DMV a letter for any person who had a fractured elbow.  In fact, I cannot recall any contact with DMV for any fracture over the last 41 years.  I do sign handicap sticker requests routinely.  Please explain, please send proper documentation, etc.

## 2020-10-23 NOTE — Telephone Encounter (Signed)
Called patient to notify - left message to return call.

## 2020-10-23 NOTE — Telephone Encounter (Signed)
I called her to advise, she voiced understanding  

## 2020-10-23 NOTE — Telephone Encounter (Signed)
She needs to see her family doctor for any note for DMV.  I have never done this and I am unfamiliar with this.  Get another physician to do this for her.  Period.

## 2020-10-24 NOTE — Telephone Encounter (Signed)
As of 10/24/20, no return call back from patient, however, she had voiced understanding of same response by Dr Hilda Lias. Closing note.

## 2020-10-27 ENCOUNTER — Telehealth: Payer: Self-pay | Admitting: Cardiology

## 2020-10-27 ENCOUNTER — Other Ambulatory Visit: Payer: Self-pay | Admitting: Orthopaedic Surgery

## 2020-10-27 ENCOUNTER — Other Ambulatory Visit: Payer: Self-pay | Admitting: Cardiology

## 2020-10-27 DIAGNOSIS — Z2821 Immunization not carried out because of patient refusal: Secondary | ICD-10-CM | POA: Diagnosis not present

## 2020-10-27 DIAGNOSIS — S59909A Unspecified injury of unspecified elbow, initial encounter: Secondary | ICD-10-CM | POA: Diagnosis not present

## 2020-10-27 DIAGNOSIS — M25561 Pain in right knee: Secondary | ICD-10-CM | POA: Diagnosis not present

## 2020-10-27 DIAGNOSIS — D6861 Antiphospholipid syndrome: Secondary | ICD-10-CM | POA: Diagnosis not present

## 2020-10-27 DIAGNOSIS — F17210 Nicotine dependence, cigarettes, uncomplicated: Secondary | ICD-10-CM | POA: Diagnosis not present

## 2020-10-27 DIAGNOSIS — I5022 Chronic systolic (congestive) heart failure: Secondary | ICD-10-CM | POA: Diagnosis not present

## 2020-10-27 DIAGNOSIS — G8929 Other chronic pain: Secondary | ICD-10-CM

## 2020-10-27 DIAGNOSIS — Z299 Encounter for prophylactic measures, unspecified: Secondary | ICD-10-CM | POA: Diagnosis not present

## 2020-10-27 DIAGNOSIS — Z6835 Body mass index (BMI) 35.0-35.9, adult: Secondary | ICD-10-CM | POA: Diagnosis not present

## 2020-10-27 DIAGNOSIS — I5042 Chronic combined systolic (congestive) and diastolic (congestive) heart failure: Secondary | ICD-10-CM

## 2020-10-27 NOTE — Telephone Encounter (Signed)
New message   The company that sends her entresto only sent her 14 tablets can you call them   Can you get her samples , she is out ?   *STAT* If patient is at the pharmacy, call can be transferred to refill team.   1. Which medications need to be refilled? (please list name of each medication and dose if known) entresto   2. Which pharmacy/location (including street and city if local pharmacy) is medication to be sent to? [facility]   3. Do they need a 30 day or 90 day supply? 90

## 2020-10-27 NOTE — Telephone Encounter (Signed)
Please call [facility] about her prescription  Number [phone]

## 2020-10-28 ENCOUNTER — Other Ambulatory Visit: Payer: Self-pay | Admitting: Cardiology

## 2020-10-28 DIAGNOSIS — I5042 Chronic combined systolic (congestive) and diastolic (congestive) heart failure: Secondary | ICD-10-CM

## 2020-10-28 MED ORDER — ENTRESTO 24-26 MG PO TABS: 1.0000 | ORAL_TABLET | Freq: Two times a day (BID) | ORAL | 3 refills | Status: DC

## 2020-10-30 NOTE — Telephone Encounter (Signed)
Patient is calling back today in regards to a message sent on Friday. The company that sends her entresto only sent her 25 tablets can you call them   Can you get her samples , she is out ?  [phone]

## 2020-10-30 NOTE — Telephone Encounter (Signed)
Call placed to [employer] [facility] - stated she received 90 day shipment on 07/04/2020.  She still has one more fill before end of year.  Asked them to refill today for patient.  Stated that she will receive via UPS this Thursday, 11/02/2020.    Returned call to patient - informed her of above.  Stated that she was not aware that she needed to call them when she needed the next refill.  Patient stated that she went ahead & bought a supply for this month.    [employer] will also send out letter to patient & office when she needs to re-apply.

## 2020-11-07 ENCOUNTER — Ambulatory Visit (INDEPENDENT_AMBULATORY_CARE_PROVIDER_SITE_OTHER): Payer: Medicare PPO | Admitting: *Deleted

## 2020-11-07 DIAGNOSIS — I63549 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: Secondary | ICD-10-CM

## 2020-11-07 DIAGNOSIS — Z5181 Encounter for therapeutic drug level monitoring: Secondary | ICD-10-CM | POA: Diagnosis not present

## 2020-11-07 DIAGNOSIS — D6859 Other primary thrombophilia: Secondary | ICD-10-CM | POA: Diagnosis not present

## 2020-11-07 LAB — POCT INR: INR: 2.1 (ref 2.0–3.0)

## 2020-11-07 NOTE — Patient Instructions (Signed)
Has increased pain pill to 4 x day Continue warfarin 1/2 tablet daily except 1 tablets on Sundays, Tuesdays and Thursdays Has been diagnosed with Alpha Gal.   Keep greens consistant Recheck in 4 weeks

## 2020-11-13 DIAGNOSIS — M5136 Other intervertebral disc degeneration, lumbar region: Secondary | ICD-10-CM | POA: Diagnosis not present

## 2020-11-14 ENCOUNTER — Telehealth: Payer: Self-pay | Admitting: Emergency Medicine

## 2020-11-14 NOTE — Telephone Encounter (Signed)
Latitude alert for ongoing AF episode since 11/12/20 at 1645. Patient reports she has not had any change in her condition. She reports no weight gain, no CP, no SOB, no pedal edema,  and no chest pressure. Remote transmission sent and reviewed that confirmed patient is still in AF with controlled v-rates. + coumadin with therapeutic  INR of 2.1.

## 2020-11-16 ENCOUNTER — Encounter: Payer: Self-pay | Admitting: *Deleted

## 2020-11-16 ENCOUNTER — Telehealth: Payer: Self-pay | Admitting: Internal Medicine

## 2020-11-16 NOTE — Progress Notes (Signed)
Cardiology Office Note  Date: 11/17/2020   ID: [name] [name], DOB 11/18/1961, MRN [id number]  PCP:  Ignatius Specking, MD  Cardiologist:  Nona Dell, MD Electrophysiologist:  Lewayne Bunting, MD   Chief Complaint: Follow-up cardiomyopathy  History of Present Illness: [name] [name] is a 59 y.o. female with a history of ischemic cardiomyopathy with ICD in place, CAD, HTN, PVD, CVA, occlusion of right internal carotid artery, anticardiolipin antibody syndrome.  History of cocaine abuse in remission.  Last encounter with Dr. Diona Browner via telemedicine 09/21/2020. She described an episode of chest pain back in early September lasting for nearly an hour with sensation of heart racing.  Denied any syncope or AICD shock.  At that time she was awaiting evaluation at advanced heart failure clinic.  She was seeing Dr. Ladona Ridgel for  Device management of [facility] Scientific ICD.  Most recent device check showed normal function.  No shocks.  Brief episodes of NSVT.  Her chronic systolic heart failure was stable.  Her weight was down compared to previous assessment.  She was currently taking Coreg, Lanoxin, Entresto, Aldactone, Demadex with potassium supplement.  Prescription was updated so she had extra edema takes as needed.  Previous history of CVA with occluded RCA.  Continuing Coumadin for positive anticardiolipin antibody.  Telephone encounter on 11/16/2020 with nursing staff.  She reported palpitations and a little shortness of breath when she woke up earlier but was no longer short of breath.  Reported lightheadedness when standing up too fast but stated she did have this problem for a while.  States she will have lab work done in the morning that was requested in September 2021.  She denies any chest pain.  She reported weighing twice daily.  Blood pressure has been around 112-120/58-78 and heart rate in 60s.  Appointment at atrial fibrillation clinic was offered but patient declined.  She is here today  status post recent alert from [employer] monitor stating she was in atrial fibrillation.  Upon arrival today she denies any issues.  States sometimes she can feel the palpitations but most of the time she cannot.  States she was not even aware she was in atrial fibrillation until she was called.  EKG today shows atrial fibrillation with occasional ventricular paced complexes with premature ventricular or aberrantly conducted complexes, incomplete left bundle branch block, ST and T wave abnormality consider inferior lateral ischemia, heart rate of 92.  She denies any anginal or exertional symptoms, palpitations or arrhythmias, orthostatic symptoms, CVA or TIA-like symptoms, PND, orthopnea, bleeding, DVT or PE-like symptoms, lower extremity edema.    Past Medical History:  Diagnosis Date  . Aneurysm of internal carotid artery   . Anticardiolipin antibody positive    Chronic Coumadin  . Chronic combined systolic (congestive) and diastolic (congestive) heart failure (HCC)   . Cocaine abuse in remission (HCC)   . Coronary atherosclerosis of native coronary artery    Previous invasive cardiac testing was done at a facility in [place], [place].  Occluded diagonal, Diffuse LAD disease, Occluded Cx, and non obstructive RCA.   . Essential hypertension   . Headache   . History of pneumonia   . Ischemic cardiomyopathy    LVEF 30-35%  . Mixed hyperlipidemia   . PVD (peripheral vascular disease) (HCC)   . Raynaud's phenomenon   . Stroke [facility]) 2007   Total occlusion of the right internal carotid    Past Surgical History:  Procedure Laterality Date  . ABDOMINAL AORTAGRAM N/A 05/25/2014  Procedure: ABDOMINAL AORTAGRAM;  Surgeon: Iran Ouch, MD;  Location: [facility] CATH LAB;  Service: Cardiovascular;  Laterality: N/A;  . APPENDECTOMY    . CARDIAC DEFIBRILLATOR PLACEMENT     [employer] ICD  . EP IMPLANTABLE DEVICE N/A 03/01/2016   Procedure: ICD Generator Changeout;  Surgeon: [name], MD;  Location: [facility]  INVASIVE CV LAB;  Service: Cardiovascular;  Laterality: N/A;  . L1 corpectomy   12/10  . Laryngeal polyp excision      Current Outpatient Medications  Medication Sig Dispense Refill  . aspirin EC 81 MG tablet Take 81 mg by mouth daily. Swallow whole.    . carvedilol (COREG) 12.5 MG tablet TAKE 1 TABLET BY MOUTH TWICE DAILY WITH MEALS 180 tablet 3  . digoxin (LANOXIN) 0.125 MG tablet Take 1 tablet (0.125 mg total) by mouth daily. 30 tablet 3  . ezetimibe (ZETIA) 10 MG tablet Take 1 tablet (10 mg total) by mouth daily. 30 tablet 3  . famotidine (PEPCID) 20 MG tablet Take 1 tablet (20 mg total) by mouth 2 (two) times daily. (Patient taking differently: Take 20 mg by mouth daily. ) 60 tablet 1  . fluticasone (FLONASE) 50 MCG/ACT nasal spray Place 1 spray into both nostrils daily as needed for allergies.     Marland Kitchen gabapentin (NEURONTIN) 300 MG capsule Take 300 mg by mouth 2 (two) times daily.    . methocarbamol (ROBAXIN) 750 MG tablet Take 750 mg by mouth every 8 (eight) hours as needed for muscle spasms.    . Multiple Vitamins-Minerals (MULTIVITAMINS THER. W/MINERALS) TABS Take 1 tablet by mouth daily.      . nitroGLYCERIN (NITROSTAT) 0.4 MG SL tablet PLACE 1 TABLET UNDER THE TONGUE EVERY 5 MINUTES FOR 3 DOSES AS NEEDED CHEST PAIN 25 tablet 3  . oxyCODONE-acetaminophen (PERCOCET) 10-325 MG tablet Take 1 tablet by mouth every 4 (four) hours as needed for pain.    . potassium chloride SA (KLOR-CON) 20 MEQ tablet TAKE 1 TABLET BY MOUTH EVERY DAY 90 tablet 1  . promethazine (PHENERGAN) 25 MG tablet Take 25 mg by mouth every 6 (six) hours as needed for nausea. With pain medication    . saccharomyces boulardii (FLORASTOR) 250 MG capsule Take 250 mg by mouth 2 (two) times daily.    . sacubitril-valsartan (ENTRESTO) 24-26 MG Take 1 tablet by mouth 2 (two) times daily. 180 tablet 3  . spironolactone (ALDACTONE) 25 MG tablet Take 0.5 tablets (12.5 mg total) by mouth daily. 15 tablet 3  . torsemide (DEMADEX) 20  MG tablet Take 4 tablets (80 mg total) by mouth daily. May take extra tablet as needed 135 tablet 3  . warfarin (COUMADIN) 10 MG tablet TAKE 1 TABLET BY MOUTH EVERY DAY EXCEPT 1/2 TABLET ON MONDAYS AND THURSDAYS 30 tablet 3   No current facility-administered medications for this visit.   Allergies:  Beef-derived products, Metoclopramide hcl, Penicillins, Pork-derived products, Metoprolol, Reglan [metoclopramide], and Statins   Social History: The patient  reports that she has been smoking cigarettes. She started smoking about 42 years ago. She has a 43.00 pack-year smoking history. She has never used smokeless tobacco. She reports that she does not drink alcohol and does not use drugs.   Family History: The patient's family history includes Ankylosing spondylitis in her sister; Heart attack in her brother; Heart disease (age of onset: 60) in her father; Stomach cancer (age of onset: 41) in her brother.   ROS:  Please see the history of present illness. Otherwise,  complete review of systems is positive for none.  All other systems are reviewed and negative.   Physical Exam: VS:  BP 106/68   Pulse 63   Ht 5\' 5"  (1.651 m)   Wt 214 lb 9.6 oz (97.3 kg)   SpO2 97%   BMI 35.71 kg/m , BMI Body mass index is 35.71 kg/m.  Wt Readings from Last 3 Encounters:  11/17/20 214 lb 9.6 oz (97.3 kg)  09/21/20 222 lb (100.7 kg)  08/25/20 234 lb (106.1 kg)    General: Patient appears comfortable at rest. Neck: Supple, no elevated JVP or carotid bruits, no thyromegaly. Lungs: Clear to auscultation, nonlabored breathing at rest. Cardiac: Irregularly irregular rate and rhythm, no S3 or significant systolic murmur, no pericardial rub. Extremities: No pitting edema, distal pulses 2+. Skin: Warm and dry. Musculoskeletal: No kyphosis. Neuropsychiatric: Alert and oriented x3, affect grossly appropriate.  ECG:  An ECG dated 11/17/2020 was personally reviewed today and demonstrated:  Atrial fibrillation with  occasional ventricular paced complexes and with premature ventricular or aberrantly conducted complexes, incomplete left bundle branch block, ST and T wave abnormality, consider inferior lateral ischemia.  Heart rate of 92.  Recent Labwork: 08/11/2020: B Natriuretic Peptide 571.0 08/12/2020: ALT 21; AST 18 08/14/2020: Hemoglobin 12.4; Platelets 183 08/15/2020: BUN 18; Creatinine, Ser 0.58; Magnesium 2.1; Potassium 4.4; Sodium 140     Component Value Date/Time   CHOL 148 08/12/2020 0639   TRIG 80 08/12/2020 0639   HDL 38 (L) 08/12/2020 0639   CHOLHDL 3.9 08/12/2020 0639   VLDL 16 08/12/2020 0639   LDLCALC 94 08/12/2020 0639    Other Studies Reviewed Today:   Echocardiogram 08/11/2020: 1. Limited visualization difficult to discern more focal wall motion  abnormalities. Compared to the Jan 06, 2020 echo there is more prominent  global hypokinesis and at least the anteroseptal wall appears akinetic. Marland Kitchen  Left ventricular ejection fraction, by  estimation, is 15-20%. The left ventricle has severely decreased  function. The left ventricle demonstrates global hypokinesis. Left  ventricular diastolic parameters are indeterminate. Elevated left atrial  pressure.  2. Right ventricular systolic function is normal. The right ventricular  size is normal.  3. Left atrial size was severely dilated.  4. The mitral valve is normal in structure. Mild mitral valve  regurgitation. No evidence of mitral stenosis.  5. The aortic valve was not well visualized. Aortic valve regurgitation  is not visualized. No aortic stenosis is present.  6. The inferior vena cava is normal in size with greater than 50%  respiratory variability, suggesting right atrial pressure of 3 mmHg.   Assessment and Plan:  1. Ischemic cardiomyopathy   2. Chronic systolic heart failure (HCC)   3. Anti-cardiolipin antibody syndrome (HCC)   4. Atrial fibrillation, unspecified type (HCC)    1. Ischemic  cardiomyopathy Echocardiogram August 2021 showed EF of 15 to 20%.  There was more prominent global hypokinesis when compared to echo January 2021.  Mild MR.  2. Chronic systolic heart failure (HCC) As mentioned above August 2021 echo showed EF 15 to 20%.  More prominent global hypokinesis compared to previous echo in January.  Continue Coreg 12.5 mg p.o. twice daily.  Continue digoxin 0.125 mg p.o. daily.  Continue Entresto 24/26 mg p.o. twice daily.  Continue spironolactone 12.5 mg p.o. daily.  Continue torsemide 80 mg daily.  Patient states she has gained some weight but apparently she has lost some weight since August 25, 2020 when she weighed 234 pounds.  Today's weight  is 214.  She denies any DOE, or lower extremity edema.  3. Anti-cardiolipin antibody syndrome (HCC) Devious history of CVA with occluded R ICA.  On Coumadin  4.  Atrial fibrillation Telephone alert via Hilarie Fredrickson, RN on 11/14/2020.  Patient had a a [employer] alert for ongoing atrial fibrillation episodes since 11/12/2020 at 1645.  She did not report any changes in her condition.  No weight gain, chest pain, shortness of breath, or pedal edema.  No chest pressure.  Remote transmission sent and reviewed and confirmed patient is still in atrial fibrillation with controlled ventricular rates.  Coumadin was therapeutic with INR 2.1.  EKG today demonstrates atrial fibrillation with a rate of 92.  She is currently on Coumadin with recent INR 2.1.  We will refer to atrial fibrillation clinic. management.  Medication Adjustments/Labs and Tests Ordered: Current medicines are reviewed at length with the patient today.  Concerns regarding medicines are outlined above.   Disposition: Follow-up with Dr. Diona Browner or APP 3 months.  Signed, Rennis Harding, NP 11/17/2020 8:19 AM    [facility] [address] [place], [place], [place] [address] Phone: [phone]; Fax: [phone]

## 2020-11-16 NOTE — Telephone Encounter (Signed)
I told the pt as soon as we here from the doctor the nurse will give her a call back.

## 2020-11-16 NOTE — Telephone Encounter (Signed)
Patient c/o Palpitations:  High priority if patient c/o lightheadedness, shortness of breath, or chest pain  1) How long have you had palpitations/irregular HR/ Afib? Are you having the symptoms now? Pt says she is in Afib now  2) Are you currently experiencing lightheadedness, SOB or CP? Little shortness of breath, a little lightheaded when she stands up  3) Do you have a history of afib (atrial fibrillation) or irregular heart rhythm? Yes  4) Have you checked your BP or HR? (document readings if available): blood pressure is good  5) Are you experiencing any other symptoms? no

## 2020-11-16 NOTE — Telephone Encounter (Signed)
Reports feeling palpitations and a little SOB this morning when she woke up but Is no longer SOB. Reports lightheadedness when she stands up too fast but says she's had this problem for a while. Says she will have lab work done in the morning that was requested in September 2021. Denies chest pain. Reports that she weighs twice daily. Reports BP has been averaging around 112-120/58-78 and HR in 60's.  Offered appointment in the [facility] but patient declined.  Scheduled visit with Nena Polio, NP in the morning at 8:00 am and informed patient Verbalized understanding

## 2020-11-17 ENCOUNTER — Encounter: Payer: Self-pay | Admitting: Family Medicine

## 2020-11-17 ENCOUNTER — Ambulatory Visit (INDEPENDENT_AMBULATORY_CARE_PROVIDER_SITE_OTHER): Payer: Medicare PPO | Admitting: Family Medicine

## 2020-11-17 VITALS — BP 106/68 | HR 63 | Ht 65.0 in | Wt 214.6 lb

## 2020-11-17 DIAGNOSIS — I5022 Chronic systolic (congestive) heart failure: Secondary | ICD-10-CM

## 2020-11-17 DIAGNOSIS — I255 Ischemic cardiomyopathy: Secondary | ICD-10-CM

## 2020-11-17 DIAGNOSIS — I4891 Unspecified atrial fibrillation: Secondary | ICD-10-CM | POA: Diagnosis not present

## 2020-11-17 DIAGNOSIS — D6861 Antiphospholipid syndrome: Secondary | ICD-10-CM

## 2020-11-17 NOTE — Addendum Note (Signed)
Addended by: Lesle Chris on: 11/17/2020 09:11 AM   Modules accepted: Orders

## 2020-11-17 NOTE — Patient Instructions (Addendum)
Medication Instructions:  Continue all current medications.  Labwork:  BMET, Magnesium, TSH - orders given today.  She will do at [facility] will contact with results via phone or letter.    Testing/Procedures: none  Follow-Up: 3 months   Any Other Special Instructions Will Be Listed Below (If Applicable). You have been referred to:  AFib clinic   If you need a refill on your cardiac medications before your next appointment, please call your pharmacy.

## 2020-11-20 ENCOUNTER — Encounter: Payer: Self-pay | Admitting: *Deleted

## 2020-11-20 ENCOUNTER — Telehealth: Payer: Self-pay | Admitting: Cardiology

## 2020-11-20 NOTE — Telephone Encounter (Signed)
New message    Patient wants to know what the results were for her has ekg

## 2020-11-20 NOTE — Telephone Encounter (Signed)
ECG:  An ECG dated 11/17/2020 was personally reviewed today and demonstrated:  Atrial fibrillation with occasional ventricular paced complexes and with premature ventricular or aberrantly conducted complexes, incomplete left bundle branch block, ST and T wave abnormality, consider inferior lateral ischemia.  Heart rate of 92.  Patient informed of the above ekg result and also informed that lab work done on 11/17/2020 was normal

## 2020-11-21 ENCOUNTER — Ambulatory Visit (HOSPITAL_COMMUNITY)
Admission: RE | Admit: 2020-11-21 | Discharge: 2020-11-21 | Disposition: A | Discharge status: Home / Self Care | Payer: Medicare PPO | Source: Ambulatory Visit | Attending: Nurse Practitioner | Admitting: Nurse Practitioner

## 2020-11-21 ENCOUNTER — Other Ambulatory Visit: Payer: Self-pay

## 2020-11-21 ENCOUNTER — Encounter (HOSPITAL_COMMUNITY): Payer: Self-pay | Admitting: Nurse Practitioner

## 2020-11-21 VITALS — BP 98/60 | HR 81 | Ht 65.0 in | Wt 212.2 lb

## 2020-11-21 DIAGNOSIS — Z8673 Personal history of transient ischemic attack (TIA), and cerebral infarction without residual deficits: Secondary | ICD-10-CM | POA: Insufficient documentation

## 2020-11-21 DIAGNOSIS — F17210 Nicotine dependence, cigarettes, uncomplicated: Secondary | ICD-10-CM | POA: Diagnosis not present

## 2020-11-21 DIAGNOSIS — I4891 Unspecified atrial fibrillation: Secondary | ICD-10-CM | POA: Insufficient documentation

## 2020-11-21 DIAGNOSIS — I255 Ischemic cardiomyopathy: Secondary | ICD-10-CM | POA: Diagnosis not present

## 2020-11-21 DIAGNOSIS — I5022 Chronic systolic (congestive) heart failure: Secondary | ICD-10-CM | POA: Insufficient documentation

## 2020-11-21 DIAGNOSIS — D6869 Other thrombophilia: Secondary | ICD-10-CM

## 2020-11-21 DIAGNOSIS — Z95810 Presence of automatic (implantable) cardiac defibrillator: Secondary | ICD-10-CM | POA: Insufficient documentation

## 2020-11-21 DIAGNOSIS — Z7901 Long term (current) use of anticoagulants: Secondary | ICD-10-CM | POA: Diagnosis not present

## 2020-11-21 DIAGNOSIS — I110 Hypertensive heart disease with heart failure: Secondary | ICD-10-CM | POA: Diagnosis not present

## 2020-11-21 NOTE — Progress Notes (Addendum)
Primary Care Physician: Ignatius Specking, MD Referring Physician: Nena Polio, NP  EP: Dr. Ladona Ridgel AHF- pending appointment with Dr. Shirlee Latch( 11/30)   [name] [name] is a 59 y.o. 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 History of cocaine abuse in remission.  The device clinic sent an alert to pt 11/14/20 that she had ongoing afib since 11/14. This  is a new dx for the pt. She has noted since then that her fluid has been harder to manage and she has felt more  fatigued. She continues in afib today, rate controlled  at 81 bpm.   We discussed options to restore SR. Since this is her first episode of afib, of short duration,  we discussed a TEE guided /cardioversion since she is on warfarin. She is in agreement with this. She is pending evaluation  with Dr. Shirlee Latch in AHF clinic on 11/30.   Today, she denies symptoms of palpitations, chest pain, shortness of breath, orthopnea, PND, lower extremity edema, dizziness, presyncope, syncope, or neurologic sequela. The patient is tolerating medications without difficulties and is otherwise without complaint today.   Past Medical History:  Diagnosis Date  . Aneurysm of internal carotid artery   . Anticardiolipin antibody positive    Chronic Coumadin  . Chronic combined systolic (congestive) and diastolic (congestive) heart failure (HCC)   . Cocaine abuse in remission (HCC)   . Coronary atherosclerosis of native coronary artery    Previous invasive cardiac testing was done at a facility in [place], [place].  Occluded diagonal, Diffuse LAD disease, Occluded Cx, and non obstructive RCA.   . Essential hypertension   . Headache   . History of pneumonia   . Ischemic cardiomyopathy    LVEF 30-35%  . Mixed hyperlipidemia   . PVD (peripheral vascular disease) (HCC)    . Raynaud's phenomenon   . Stroke [facility]) 2007   Total occlusion of the right internal carotid   Past Surgical History:  Procedure Laterality Date  . ABDOMINAL AORTAGRAM N/A 05/25/2014   Procedure: ABDOMINAL Ronny Flurry;  Surgeon: Iran Ouch, MD;  Location: [facility] CATH LAB;  Service: Cardiovascular;  Laterality: N/A;  . APPENDECTOMY    . CARDIAC DEFIBRILLATOR PLACEMENT     St.Jude ICD  . EP IMPLANTABLE DEVICE N/A 03/01/2016   Procedure: ICD Generator Changeout;  Surgeon: [name], MD;  Location: [facility] INVASIVE CV LAB;  Service: Cardiovascular;  Laterality: N/A;  . L1 corpectomy   12/10  . Laryngeal polyp excision      Current Outpatient Medications  Medication Sig Dispense Refill  . aspirin EC 81 MG tablet Take 81 mg by mouth daily. Swallow whole.    . carvedilol (COREG) 12.5 MG tablet TAKE 1 TABLET BY MOUTH TWICE DAILY WITH MEALS 180 tablet 3  . digoxin (LANOXIN) 0.125 MG tablet Take 1 tablet (0.125 mg total) by mouth daily. 30 tablet 3  . ezetimibe (ZETIA) 10 MG tablet Take 1 tablet (10 mg total) by mouth daily. 30 tablet 3  . famotidine (PEPCID) 20 MG tablet Take 1 tablet (20 mg total) by mouth 2 (two) times daily. (Patient taking differently: Take 20 mg by mouth daily. ) 60 tablet 1  . fluticasone (FLONASE) 50 MCG/ACT nasal spray Place 1 spray into both nostrils daily as needed for allergies.     Marland Kitchen  gabapentin (NEURONTIN) 300 MG capsule Take 300 mg by mouth 2 (two) times daily. Take 1 tablet in the AM and 2 tablets in the PM    . methocarbamol (ROBAXIN) 750 MG tablet Take 750 mg by mouth every 8 (eight) hours as needed for muscle spasms.    . Multiple Vitamins-Minerals (MULTIVITAMINS THER. W/MINERALS) TABS Take 1 tablet by mouth daily.      Marland Kitchen oxyCODONE-acetaminophen (PERCOCET) 10-325 MG tablet Take 1 tablet by mouth every 4 (four) hours as needed for pain.    . potassium chloride SA (KLOR-CON) 20 MEQ tablet TAKE 1 TABLET BY MOUTH EVERY DAY 90 tablet 1  . promethazine (PHENERGAN) 25  MG tablet Take 25 mg by mouth every 6 (six) hours as needed for nausea. With pain medication    . sacubitril-valsartan (ENTRESTO) 24-26 MG Take 1 tablet by mouth 2 (two) times daily. 180 tablet 3  . spironolactone (ALDACTONE) 25 MG tablet Take 0.5 tablets (12.5 mg total) by mouth daily. 15 tablet 3  . torsemide (DEMADEX) 20 MG tablet Take 4 tablets (80 mg total) by mouth daily. May take extra tablet as needed 135 tablet 3  . warfarin (COUMADIN) 10 MG tablet TAKE 1 TABLET BY MOUTH EVERY DAY EXCEPT 1/2 TABLET ON MONDAYS AND THURSDAYS 30 tablet 3  . nitroGLYCERIN (NITROSTAT) 0.4 MG SL tablet PLACE 1 TABLET UNDER THE TONGUE EVERY 5 MINUTES FOR 3 DOSES AS NEEDED CHEST PAIN (Patient not taking: Reported on 11/21/2020) 25 tablet 3   No current facility-administered medications for this encounter.    Allergies  Allergen Reactions  . Beef-Derived Products Anaphylaxis    From tick bite  . Metoclopramide Hcl Anaphylaxis    Non responsive  . Penicillins Anaphylaxis    Pt states she can take any cephalosporin (per pt. 01/01/19) Shock Has patient had a PCN reaction causing immediate rash, facial/tongue/throat swelling, SOB or lightheadedness with hypotension: Yes Has patient had a PCN reaction causing severe rash involving mucus membranes or skin necrosis: No Has patient had a PCN reaction that required hospitalization Yes Has patient had a PCN reaction occurring within the last 10 years: No If all of the above answers are "NO", then may proceed with Cephalosporin   . Pork-Derived Products Anaphylaxis    From tick bite  . Metoprolol Other (See Comments)    Syncope   . Reglan [Metoclopramide]   . Statins Other (See Comments)    Myalgias    Social History   Socioeconomic History  . Marital status: Single    Spouse name: Not on file  . Number of children: Not on file  . Years of education: Not on file  . Highest education level: Not on file  Occupational History  . Occupation: Disabled     Comment: ESL  Tobacco Use  . Smoking status: Current Every Day Smoker    Packs/day: 1.00    Years: 43.00    Pack years: 43.00    Types: Cigarettes    Start date: 03/03/1978    Last attempt to quit: 10/03/2013    Years since quitting: 7.1  . Smokeless tobacco: Never Used  . Tobacco comment: 4 ciggs per day  Vaping Use  . Vaping Use: Every day  Substance and Sexual Activity  . Alcohol use: No    Alcohol/week: 0.0 standard drinks  . Drug use: No    Comment: Prior history of cocaine  . Sexual activity: Yes    Birth control/protection: None  Other Topics Concern  . Not  on file  Social History Narrative  . Not on file   Social Determinants of Health   Financial Resource Strain:   . Difficulty of Paying Living Expenses: Not on file  Food Insecurity:   . Worried About Programme researcher, broadcasting/film/video in the Last Year: Not on file  . Ran Out of Food in the Last Year: Not on file  Transportation Needs:   . Lack of Transportation (Medical): Not on file  . Lack of Transportation (Non-Medical): Not on file  Physical Activity:   . Days of Exercise per Week: Not on file  . Minutes of Exercise per Session: Not on file  Stress:   . Feeling of Stress : Not on file  Social Connections:   . Frequency of Communication with Friends and Family: Not on file  . Frequency of Social Gatherings with Friends and Family: Not on file  . Attends Religious Services: Not on file  . Active Member of Clubs or Organizations: Not on file  . Attends Banker Meetings: Not on file  . Marital Status: Not on file  Intimate Partner Violence:   . Fear of Current or Ex-Partner: Not on file  . Emotionally Abused: Not on file  . Physically Abused: Not on file  . Sexually Abused: Not on file    Family History  Problem Relation Age of Onset  . Heart disease Father 66  . Ankylosing spondylitis Sister   . Stomach cancer Brother 72  . Heart attack Brother     ROS- All systems are reviewed and negative except  as per the HPI above  Physical Exam: Vitals:   11/21/20 0953  BP: 98/60  Pulse: 81  Weight: 96.3 kg  Height: 5\' 5"  (1.651 m)   Wt Readings from Last 3 Encounters:  11/21/20 96.3 kg  11/17/20 97.3 kg  09/21/20 100.7 kg    Labs: Lab Results  Component Value Date   NA 140 08/15/2020   K 4.4 08/15/2020   CL 103 08/15/2020   CO2 27 08/15/2020   GLUCOSE 95 08/15/2020   BUN 18 08/15/2020   CREATININE 0.58 08/15/2020   CALCIUM 8.8 (L) 08/15/2020   PHOS 3.3 08/13/2020   MG 2.1 08/15/2020   Lab Results  Component Value Date   INR 2.1 11/07/2020   Lab Results  Component Value Date   CHOL 148 08/12/2020   HDL 38 (L) 08/12/2020   LDLCALC 94 08/12/2020   TRIG 80 08/12/2020     GEN- The patient is well appearing, alert and oriented x 3 today.   Head- normocephalic, atraumatic Eyes-  Sclera clear, conjunctiva pink Ears- hearing intact Oropharynx- clear Neck- supple, no JVP Lymph- no cervical lymphadenopathy Lungs- Clear to ausculation bilaterally, normal work of breathing Heart- irregular rate and rhythm, no murmurs, rubs or gallops, PMI not laterally displaced GI- soft, NT, ND, + BS Extremities- no clubbing, cyanosis, or edema MS- no significant deformity or atrophy Skin- no rash or lesion Psych- euthymic mood, full affect Neuro- strength and sensation are intact  EKG-afib with frequent v paced complexes at 81 bpm, consider inf/ant ischemia, qrs int 92 bpm, qtc 422 ms   Echo-1. Limited visualization difficult to discern more focal wall motion abnormalities. Compared to the Jan 06, 2020 echo there is more prominent global hypokinesis and at least the anteroseptal wall appears akinetic. Jan 08, 2020 Left ventricular ejection fraction, by estimation, is 15- 20%. The left ventricle has severely decreased function. The left ventricle demonstrates global hypokinesis. Left ventricular diastolic  parameters are indeterminate. Elevated left atrial pressure. 2. Right ventricular systolic  function is normal. The right ventricular size is normal. 3. Left atrial size was severely dilated. 4. The mitral valve is normal in structure. Mild mitral valve regurgitation. No evidence of mitral stenosis. 5. The aortic valve was not well visualized. Aortic valve regurgitation is not visualized. No aortic stenosis is present. 6. The inferior vena cava is normal in size with greater than 50% respiratory variability, suggesting right atrial pressure of 3 mmHg. Left Ventricle: Limited visualization difficult to discern more focal wall motion abnormalities. Compared to  Assessment and Plan: 1, New onset afib  General education re afib Triggers discussed  Ongoning since 11/14 Fluid retention has been more of an issues since in afib  Rate controlled Continue carvedilol 12.5 mg bid  Discussed cardioversion, risk vrs benefit  Option of 4 weekly INR's prior to cardioversion vrs TEE/guided cardioversion She would like to proceed with TEE guided cardioversion  2. Ischemic cardiomyopathy  EF 15-20% ICD in place Per Dr. Ladona Ridgel   3. Chronic systolic heart failure  Continue coreg 12.5 mg bid, Digoxin 0.125 mg daily. Entresto 24/26 bid, spironolactone 12.5 mg daily, torsemide 80 mg daily( pt has been increasing recently due to fluid probably 2/2 afib)  Weight stable today   4. Anti-cardiolipin antibody syndrome H/o CVA with occluded R ICA, on coumadin   Pt did not have time to set up cardioversion today. She has f/u with Dr. Shirlee Latch 11/30, will need to set up TEE/guided  cardioversion with Dr. Alford Highland schedule on return. Will need bmet/cbc/covid testing scheduled   Lupita Leash C. Matthew Folks [facility] [address] [place], [place] [address] [phone]

## 2020-11-28 ENCOUNTER — Encounter (HOSPITAL_COMMUNITY): Payer: Self-pay | Admitting: Cardiology

## 2020-11-28 ENCOUNTER — Ambulatory Visit (HOSPITAL_COMMUNITY)
Admission: RE | Admit: 2020-11-28 | Discharge: 2020-11-28 | Disposition: A | Discharge status: Home / Self Care | Payer: Medicare PPO | Source: Ambulatory Visit | Attending: Cardiology | Admitting: Cardiology

## 2020-11-28 ENCOUNTER — Other Ambulatory Visit: Payer: Self-pay

## 2020-11-28 ENCOUNTER — Other Ambulatory Visit (HOSPITAL_COMMUNITY): Payer: Self-pay | Admitting: *Deleted

## 2020-11-28 VITALS — BP 138/78 | HR 98 | Wt 209.2 lb

## 2020-11-28 DIAGNOSIS — I6521 Occlusion and stenosis of right carotid artery: Secondary | ICD-10-CM | POA: Diagnosis not present

## 2020-11-28 DIAGNOSIS — I252 Old myocardial infarction: Secondary | ICD-10-CM | POA: Insufficient documentation

## 2020-11-28 DIAGNOSIS — I4819 Other persistent atrial fibrillation: Secondary | ICD-10-CM | POA: Diagnosis not present

## 2020-11-28 DIAGNOSIS — Z7901 Long term (current) use of anticoagulants: Secondary | ICD-10-CM | POA: Insufficient documentation

## 2020-11-28 DIAGNOSIS — I255 Ischemic cardiomyopathy: Secondary | ICD-10-CM | POA: Diagnosis not present

## 2020-11-28 DIAGNOSIS — I5022 Chronic systolic (congestive) heart failure: Secondary | ICD-10-CM | POA: Diagnosis not present

## 2020-11-28 DIAGNOSIS — Z7982 Long term (current) use of aspirin: Secondary | ICD-10-CM | POA: Insufficient documentation

## 2020-11-28 DIAGNOSIS — I5042 Chronic combined systolic (congestive) and diastolic (congestive) heart failure: Secondary | ICD-10-CM | POA: Diagnosis not present

## 2020-11-28 DIAGNOSIS — Z8249 Family history of ischemic heart disease and other diseases of the circulatory system: Secondary | ICD-10-CM | POA: Diagnosis not present

## 2020-11-28 DIAGNOSIS — I2510 Atherosclerotic heart disease of native coronary artery without angina pectoris: Secondary | ICD-10-CM | POA: Diagnosis not present

## 2020-11-28 DIAGNOSIS — Z79899 Other long term (current) drug therapy: Secondary | ICD-10-CM | POA: Insufficient documentation

## 2020-11-28 DIAGNOSIS — E785 Hyperlipidemia, unspecified: Secondary | ICD-10-CM

## 2020-11-28 DIAGNOSIS — Z8673 Personal history of transient ischemic attack (TIA), and cerebral infarction without residual deficits: Secondary | ICD-10-CM | POA: Diagnosis not present

## 2020-11-28 DIAGNOSIS — I6523 Occlusion and stenosis of bilateral carotid arteries: Secondary | ICD-10-CM

## 2020-11-28 DIAGNOSIS — Z7984 Long term (current) use of oral hypoglycemic drugs: Secondary | ICD-10-CM | POA: Diagnosis not present

## 2020-11-28 DIAGNOSIS — Z95810 Presence of automatic (implantable) cardiac defibrillator: Secondary | ICD-10-CM | POA: Insufficient documentation

## 2020-11-28 DIAGNOSIS — I4891 Unspecified atrial fibrillation: Secondary | ICD-10-CM | POA: Diagnosis not present

## 2020-11-28 DIAGNOSIS — D6861 Antiphospholipid syndrome: Secondary | ICD-10-CM | POA: Diagnosis not present

## 2020-11-28 DIAGNOSIS — I739 Peripheral vascular disease, unspecified: Secondary | ICD-10-CM | POA: Insufficient documentation

## 2020-11-28 DIAGNOSIS — F17200 Nicotine dependence, unspecified, uncomplicated: Secondary | ICD-10-CM | POA: Diagnosis not present

## 2020-11-28 DIAGNOSIS — I110 Hypertensive heart disease with heart failure: Secondary | ICD-10-CM | POA: Insufficient documentation

## 2020-11-28 LAB — LIPID PANEL
Cholesterol: 135 mg/dL (ref 0–200)
HDL: 36 mg/dL — ABNORMAL LOW (ref >40)
LDL Cholesterol: 78 mg/dL (ref 0–99)
Total CHOL/HDL Ratio: 3.8 RATIO
Triglycerides: 105 mg/dL (ref <150)
VLDL: 21 mg/dL (ref 0–40)

## 2020-11-28 LAB — BASIC METABOLIC PANEL
Anion gap: 15 (ref 5–15)
BUN: 13 mg/dL (ref 6–20)
CO2: 22 mmol/L (ref 22–32)
Calcium: 9 mg/dL (ref 8.9–10.3)
Chloride: 99 mmol/L (ref 98–111)
Creatinine, Ser: 0.91 mg/dL (ref 0.44–1.00)
GFR, Estimated: >60 mL/min (ref >60)
Glucose, Bld: 190 mg/dL — ABNORMAL HIGH (ref 70–99)
Potassium: 3.4 mmol/L — ABNORMAL LOW (ref 3.5–5.1)
Sodium: 136 mmol/L (ref 135–145)

## 2020-11-28 LAB — PROTIME-INR
INR: 3.1 — ABNORMAL HIGH (ref 0.8–1.2)
Prothrombin Time: 30.8 seconds — ABNORMAL HIGH (ref 11.4–15.2)

## 2020-11-28 LAB — DIGOXIN LEVEL: Digoxin Level: 0.9 ng/mL — ABNORMAL LOW (ref 1.0–2.0)

## 2020-11-28 MED ORDER — DAPAGLIFLOZIN PROPANEDIOL 10 MG PO TABS: 10.0000 mg | ORAL_TABLET | Freq: Every day | ORAL | 6 refills | Status: DC

## 2020-11-28 MED ORDER — SPIRONOLACTONE 25 MG PO TABS
25.0000 mg | ORAL_TABLET | Freq: Every day | ORAL | 3 refills | Status: DC
Start: 2020-11-28 — End: 2021-07-18

## 2020-11-28 NOTE — Patient Instructions (Signed)
Increase Spironolactone to 25 mg Daily  Start Farxiga 10 mg Daily  Labs done today, we will call you for abnormal results  Your physician recommends that you return for lab work in: 1-2 weeks, this [facility] be done in [facility]  Your physician has requested that you have a carotid duplex. This test is an ultrasound of the carotid arteries in your neck. It looks at blood flow through these arteries that supply the brain with blood. Allow one hour for this exam. There are no restrictions or special instructions. THIS CAN BE DONE IN [place], THEY WILL CALL YOU FOR AN APPOINTMENT  Your physician has requested that you have a TEE/Cardioversion. During a TEE, sound waves are used to create images of your heart. It provides your doctor with information about the size and shape of your heart and how well your heart's chambers and valves are working. In this test, a transducer is attached to the end of a flexible tube that is guided down you throat and into your esophagus (the tube leading from your mouth to your stomach) to get a more detailed image of your heart. Once the TEE has determined that a blood clot is not present, the cardioversion begins. Electrical Cardioversion uses a jolt of electricity to your heart either through paddles or wired patches attached to your chest. This is a controlled, usually prescheduled, procedure. This procedure is done at the hospital and you are not awake during the procedure. You usually go home the day of the procedure. Please see the instruction sheet given to you today for more information.  Your physician recommends that you schedule a follow-up appointment in: 1 month  If you have any questions or concerns before your next appointment please send [place] a message through [place] or call our office at [phone].    TO LEAVE A MESSAGE FOR THE NURSE SELECT OPTION 2, PLEASE LEAVE A MESSAGE INCLUDING: . YOUR NAME . DATE OF BIRTH . CALL BACK NUMBER . REASON FOR CALL**this is  important as we prioritize the call backs  YOU WILL RECEIVE A CALL BACK THE SAME DAY AS LONG AS YOU CALL BEFORE 4:00 PM  At the [facility], you and your health needs are our priority. As part of our continuing mission to provide you with exceptional heart care, we have created designated Provider Care Teams. These Care Teams include your primary Cardiologist (physician) and Advanced Practice Providers (APPs- Physician Assistants and Nurse Practitioners) who all work together to provide you with the care you need, when you need it.   You may see any of the following providers on your designated Care Team at your next follow up: Marland Kitchen Dr [name] . Dr [name] . Tonye Becket, NP . Robbie Lis, PA . Karle Plumber, PharmD   Please be sure to bring in all your medications bottles to every appointment.    CARDIOVERSION INSTRUCTIONS:  You are scheduled for a TEE Cardioversion on Friday 12/01/20 with Dr. Shirlee Latch.  Please arrive at the [facility] (Main Entrance A) at [facility]: [address] [place], [place] [address] at 12:00pm.   DIET: Nothing to eat or drink after midnight except a sip of water with medications (see medication instructions below)  Medication Instructions: Hold [name] and Torsemide Fri 12/3 AM  Continue your anticoagulant: Coumadin, do not miss any doses   COVID TEST: THIS CAN BE DONE AT [facility], THEY WILL CALL YOU TO SCHEDULE THIS  You must have a  responsible person to drive you home and stay in the waiting area during your procedure. Failure to do so could result in cancellation.  Bring your insurance cards.  *Special Note: Every effort is made to have your procedure done on time. Occasionally there are emergencies that occur at the hospital that may cause delays. Please be patient if a delay does occur.

## 2020-11-29 ENCOUNTER — Telehealth (HOSPITAL_COMMUNITY): Payer: Self-pay | Admitting: Surgery

## 2020-11-29 ENCOUNTER — Other Ambulatory Visit: Payer: Self-pay | Admitting: Orthopaedic Surgery

## 2020-11-29 DIAGNOSIS — G8929 Other chronic pain: Secondary | ICD-10-CM

## 2020-11-29 NOTE — H&P (View-Only) (Signed)
PCP: Ignatius Specking, MD Cardiology: Dr. Diona Browner HF Cardiology: Dr. Shirlee Latch  59 y.o. with history of CVA, anti-phospholipid antibody syndrome, CAD, ischemic cardiomyopathy, and persistent atrial fibrillation was referred by Dr. Diona Browner for evaluation of CHF. Patient had a cath in 2008 in setting of an MI in [place].  Diagonal and LCx were totally occluded, there was diffuse LAD disease.  She has not had a cath since that time. She has a Environmental manager ICD.  Most recent echo in 8/21 showed EF 15-20%, anteroseptal akinesis, normal RV, severe LAE, mild MR. She was admitted in 8/21 with CHF exacerbation for 6 days.  Earlier this month, she went into atrial fibrillation and has been in AF persistently since then. She is still smoking 4 cigarettes/day.   She has been more short of breath with exertion since going into atrial fibrillation.  She is dyspneic after walking 100 feet or with moderate housework like vaccuuming.  No exertional chest pain.  She has had episodes where she will have a "bad dream" and wake up with chest pain.  This will resolve with NTG.  She does not feel palpitations.  No claudication. No orthopnea/PND.   ECG (personally reviewed): Atrial fibrillation with PVCs, rate 103, QTc 429, QRS 90  Labs (8/21): K 4.4, creatinine 0.58, LDL 94  PMH: 1. Brain aneurysm: Followed by Dr. Corliss Skains.  2. HTN 3. CVA: Total occlusion of RICA.  4. Anti-phospholipid antibody syndrome: Diagnosed at [facility].  - On warfarin and ASA 81 given history of CVA.  5. Remote cocaine abuse.  6. PAD: peripheral arterial dopplers in 3/21 with early left-sided iliac disease, right anterior tibial disease; ABI 1.0 on right and 0.75 on left.  7. Active smoker.  8. L-spine arthritis.  9. CAD: MI in 2008 in [facility], cath at the time showed totally occluded diagonal, totally occluded LCX, and diffuse LAD disease.  No intervention.   10. Atrial fibrillation: Persistent, started in 11/21.  11. Chronic systolic CHF:  Ischemic cardiomyopathy.  [employer] ICD.  - Echo (8/21): EF 15-20%, anteroseptal AK, RV normal, severe LAE, mild MR.   12. Hyperlipidemia: Statin intolerant.   FH: Father, grandfather with MIs.  Brother with MI in his 17s.   SH: Lives in [place], on disability, smokes 4 cigs/day.  Prior cocaine.  No ETOH.   ROS: All systems reviewed and negative except as per HPI.   Current Outpatient Medications  Medication Sig Dispense Refill  . aspirin EC 81 MG tablet Take 81 mg by mouth daily. Swallow whole.    . carvedilol (COREG) 12.5 MG tablet TAKE 1 TABLET BY MOUTH TWICE DAILY WITH MEALS (Patient taking differently: Take 12.5 mg by mouth 2 (two) times daily with a meal. ) 180 tablet 3  . dapagliflozin propanediol (FARXIGA) 10 MG TABS tablet Take 1 tablet (10 mg total) by mouth daily before breakfast. 30 tablet 6  . digoxin (LANOXIN) 0.125 MG tablet Take 1 tablet (0.125 mg total) by mouth daily. 30 tablet 3  . ezetimibe (ZETIA) 10 MG tablet Take 1 tablet (10 mg total) by mouth daily. 30 tablet 3  . famotidine (PEPCID) 20 MG tablet Take 1 tablet (20 mg total) by mouth 2 (two) times daily. (Patient taking differently: Take 20 mg by mouth daily. ) 60 tablet 1  . fluticasone (FLONASE) 50 MCG/ACT nasal spray Place 1 spray into both nostrils daily as needed for allergies.     Marland Kitchen gabapentin (NEURONTIN) 300 MG capsule Take 300 mg by mouth See  admin instructions. Take 300 mg  in the AM and 600 mg  in the PM    . methocarbamol (ROBAXIN) 750 MG tablet Take 750 mg by mouth every 8 (eight) hours as needed for muscle spasms.    . Multiple Vitamins-Minerals (MULTIVITAMINS THER. W/MINERALS) TABS Take 1 tablet by mouth daily.      . nitroGLYCERIN (NITROSTAT) 0.4 MG SL tablet PLACE 1 TABLET UNDER THE TONGUE EVERY 5 MINUTES FOR 3 DOSES AS NEEDED CHEST PAIN (Patient taking differently: Place 0.4 mg under the tongue every 5 (five) minutes as needed for chest pain. ) 25 tablet 3  . oxyCODONE-acetaminophen (PERCOCET)  10-325 MG tablet Take 1 tablet by mouth every 4 (four) hours as needed for pain.    . potassium chloride SA (KLOR-CON) 20 MEQ tablet TAKE 1 TABLET BY MOUTH EVERY DAY (Patient taking differently: Take 20 mEq by mouth daily. ) 90 tablet 1  . promethazine (PHENERGAN) 25 MG tablet Take 25 mg by mouth every 6 (six) hours as needed for nausea. With pain medication    . sacubitril-valsartan (ENTRESTO) 24-26 MG Take 1 tablet by mouth 2 (two) times daily. 180 tablet 3  . spironolactone (ALDACTONE) 25 MG tablet Take 1 tablet (25 mg total) by mouth daily. (Patient taking differently: Take 25 mg by mouth at bedtime. ) 30 tablet 3  . torsemide (DEMADEX) 20 MG tablet Take 4 tablets (80 mg total) by mouth daily. May take extra tablet as needed 135 tablet 3  . warfarin (COUMADIN) 10 MG tablet TAKE 1 TABLET BY MOUTH EVERY DAY EXCEPT 1/2 TABLET ON MONDAYS AND THURSDAYS (Patient taking differently: Take 10 mg by mouth See admin instructions. Take 5 mg Mon. Wed, Friday , and Saturday All the other days take 10 mg in the evening) 30 tablet 3   No current facility-administered medications for this encounter.   BP 138/78   Pulse 98   Wt 94.9 kg (209 lb 3.2 oz)   SpO2 98%   BMI 34.81 kg/m  General: NAD Neck: No JVD, no thyromegaly or thyroid nodule.  Lungs: Clear to auscultation bilaterally with normal respiratory effort. CV: Nondisplaced PMI.  Heart irregular S1/S2, no S3/S4, no murmur.  No peripheral edema.  No carotid bruit. Unable to palpate pedal pulses.  Abdomen: Soft, nontender, no hepatosplenomegaly, no distention.  Skin: Intact without lesions or rashes.  Neurologic: Alert and oriented x 3.  Psych: Normal affect. Extremities: No clubbing or cyanosis.  HEENT: Normal.   Assessment/Plan: 1. CAD: MI in 2008 in [facility], cath at the time showed totally occluded diagonal, totally occluded LCX, and diffuse LAD disease.  No intervention. She has atypical chest pain, nothing exertional.  - Continue ASA 81 daily.  -  Continue Zetia, has not tolerated statins.  Check lipids today, consider Repatha if LDL > 70.  2. Atrial fibrillation: Persistent, new diagnosis this month.  Symptomatically, has been worse recently in setting of atrial fibrillation. Need to get her back in NSR.  - TEE-guided DCCV.   If this fails, I will admit her for Tikosyn. We discussed risks/benefits and she agrees to [facility].  - Continue warfarin, will get INR today and again on the day of DCCV.  3. Carotid stenosis: Known RICA occlusion.  - I will arrange for repeat carotid dopplers.  4. 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  - She is on ASA 81 and warfarin.  5. Chronic systolic CHF: Ischemic cardiomyopathy.  [employer] ICD.  Echo with EF 15-20%, anteroseptal AK,  RV normal, severe LAE, mild MR.  NYHA class II symptoms, not volume overloaded on exam. Symptoms worse in atrial fibrillation. BP is high for her today, generally SBP 90s-110s.  - Will arrange for TEE-DCCV to get back into NSR (as above).  - Increase spironolactone to 25 mg daily, BMET today and again in 10 days.  - Add Farxiga 10 mg daily.   - Continue Entresto 24/26, if BP remains stable will increase in future.  - Continue Coreg 12.5 mg bid.  - EF is quite low at this point, I would like to arrange for eventual LHC/RHC (no cath since 2008).  I would like to look for revascularizable coronary disease and also to assess filling pressures and cardiac output.  I would aim for > 1 month post-DCCV to allow at least a month of continuous anticoagulation post-DCCV.  She will need Lovenox bridge when she does cath.  6. Smoking: Active smoker, wants to stop on her own.  7. PAD: She denies claudication.  - Needs to quit smoking.   Followup with me in 1 month.   [name] 11/29/2020

## 2020-11-29 NOTE — Progress Notes (Signed)
PCP: Ignatius Specking, MD Cardiology: Dr. Diona Browner HF Cardiology: Dr. Shirlee Latch  59 y.o. with history of CVA, anti-phospholipid antibody syndrome, CAD, ischemic cardiomyopathy, and persistent atrial fibrillation was referred by Dr. Diona Browner for evaluation of CHF. Patient had a cath in 2008 in setting of an MI in [place].  Diagonal and LCx were totally occluded, there was diffuse LAD disease.  She has not had a cath since that time. She has a Environmental manager ICD.  Most recent echo in 8/21 showed EF 15-20%, anteroseptal akinesis, normal RV, severe LAE, mild MR. She was admitted in 8/21 with CHF exacerbation for 6 days.  Earlier this month, she went into atrial fibrillation and has been in AF persistently since then. She is still smoking 4 cigarettes/day.   She has been more short of breath with exertion since going into atrial fibrillation.  She is dyspneic after walking 100 feet or with moderate housework like vaccuuming.  No exertional chest pain.  She has had episodes where she will have a "bad dream" and wake up with chest pain.  This will resolve with NTG.  She does not feel palpitations.  No claudication. No orthopnea/PND.   ECG (personally reviewed): Atrial fibrillation with PVCs, rate 103, QTc 429, QRS 90  Labs (8/21): K 4.4, creatinine 0.58, LDL 94  PMH: 1. Brain aneurysm: Followed by Dr. Corliss Skains.  2. HTN 3. CVA: Total occlusion of RICA.  4. Anti-phospholipid antibody syndrome: Diagnosed at [facility].  - On warfarin and ASA 81 given history of CVA.  5. Remote cocaine abuse.  6. PAD: peripheral arterial dopplers in 3/21 with early left-sided iliac disease, right anterior tibial disease; ABI 1.0 on right and 0.75 on left.  7. Active smoker.  8. L-spine arthritis.  9. CAD: MI in 2008 in [facility], cath at the time showed totally occluded diagonal, totally occluded LCX, and diffuse LAD disease.  No intervention.   10. Atrial fibrillation: Persistent, started in 11/21.  11. Chronic systolic CHF:  Ischemic cardiomyopathy.  [employer] ICD.  - Echo (8/21): EF 15-20%, anteroseptal AK, RV normal, severe LAE, mild MR.   12. Hyperlipidemia: Statin intolerant.   FH: Father, grandfather with MIs.  Brother with MI in his 17s.   SH: Lives in [place], on disability, smokes 4 cigs/day.  Prior cocaine.  No ETOH.   ROS: All systems reviewed and negative except as per HPI.   Current Outpatient Medications  Medication Sig Dispense Refill  . aspirin EC 81 MG tablet Take 81 mg by mouth daily. Swallow whole.    . carvedilol (COREG) 12.5 MG tablet TAKE 1 TABLET BY MOUTH TWICE DAILY WITH MEALS (Patient taking differently: Take 12.5 mg by mouth 2 (two) times daily with a meal. ) 180 tablet 3  . dapagliflozin propanediol (FARXIGA) 10 MG TABS tablet Take 1 tablet (10 mg total) by mouth daily before breakfast. 30 tablet 6  . digoxin (LANOXIN) 0.125 MG tablet Take 1 tablet (0.125 mg total) by mouth daily. 30 tablet 3  . ezetimibe (ZETIA) 10 MG tablet Take 1 tablet (10 mg total) by mouth daily. 30 tablet 3  . famotidine (PEPCID) 20 MG tablet Take 1 tablet (20 mg total) by mouth 2 (two) times daily. (Patient taking differently: Take 20 mg by mouth daily. ) 60 tablet 1  . fluticasone (FLONASE) 50 MCG/ACT nasal spray Place 1 spray into both nostrils daily as needed for allergies.     Marland Kitchen gabapentin (NEURONTIN) 300 MG capsule Take 300 mg by mouth See  admin instructions. Take 300 mg  in the AM and 600 mg  in the PM    . methocarbamol (ROBAXIN) 750 MG tablet Take 750 mg by mouth every 8 (eight) hours as needed for muscle spasms.    . Multiple Vitamins-Minerals (MULTIVITAMINS THER. W/MINERALS) TABS Take 1 tablet by mouth daily.      . nitroGLYCERIN (NITROSTAT) 0.4 MG SL tablet PLACE 1 TABLET UNDER THE TONGUE EVERY 5 MINUTES FOR 3 DOSES AS NEEDED CHEST PAIN (Patient taking differently: Place 0.4 mg under the tongue every 5 (five) minutes as needed for chest pain. ) 25 tablet 3  . oxyCODONE-acetaminophen (PERCOCET)  10-325 MG tablet Take 1 tablet by mouth every 4 (four) hours as needed for pain.    . potassium chloride SA (KLOR-CON) 20 MEQ tablet TAKE 1 TABLET BY MOUTH EVERY DAY (Patient taking differently: Take 20 mEq by mouth daily. ) 90 tablet 1  . promethazine (PHENERGAN) 25 MG tablet Take 25 mg by mouth every 6 (six) hours as needed for nausea. With pain medication    . sacubitril-valsartan (ENTRESTO) 24-26 MG Take 1 tablet by mouth 2 (two) times daily. 180 tablet 3  . spironolactone (ALDACTONE) 25 MG tablet Take 1 tablet (25 mg total) by mouth daily. (Patient taking differently: Take 25 mg by mouth at bedtime. ) 30 tablet 3  . torsemide (DEMADEX) 20 MG tablet Take 4 tablets (80 mg total) by mouth daily. May take extra tablet as needed 135 tablet 3  . warfarin (COUMADIN) 10 MG tablet TAKE 1 TABLET BY MOUTH EVERY DAY EXCEPT 1/2 TABLET ON MONDAYS AND THURSDAYS (Patient taking differently: Take 10 mg by mouth See admin instructions. Take 5 mg Mon. Wed, Friday , and Saturday All the other days take 10 mg in the evening) 30 tablet 3   No current facility-administered medications for this encounter.   BP 138/78   Pulse 98   Wt 94.9 kg (209 lb 3.2 oz)   SpO2 98%   BMI 34.81 kg/m  General: NAD Neck: No JVD, no thyromegaly or thyroid nodule.  Lungs: Clear to auscultation bilaterally with normal respiratory effort. CV: Nondisplaced PMI.  Heart irregular S1/S2, no S3/S4, no murmur.  No peripheral edema.  No carotid bruit. Unable to palpate pedal pulses.  Abdomen: Soft, nontender, no hepatosplenomegaly, no distention.  Skin: Intact without lesions or rashes.  Neurologic: Alert and oriented x 3.  Psych: Normal affect. Extremities: No clubbing or cyanosis.  HEENT: Normal.   Assessment/Plan: 1. CAD: MI in 2008 in [facility], cath at the time showed totally occluded diagonal, totally occluded LCX, and diffuse LAD disease.  No intervention. She has atypical chest pain, nothing exertional.  - Continue ASA 81 daily.  -  Continue Zetia, has not tolerated statins.  Check lipids today, consider Repatha if LDL > 70.  2. Atrial fibrillation: Persistent, new diagnosis this month.  Symptomatically, has been worse recently in setting of atrial fibrillation. Need to get her back in NSR.  - TEE-guided DCCV.   If this fails, I will admit her for Tikosyn. We discussed risks/benefits and she agrees to [facility].  - Continue warfarin, will get INR today and again on the day of DCCV.  3. Carotid stenosis: Known RICA occlusion.  - I will arrange for repeat carotid dopplers.  4. 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  - She is on ASA 81 and warfarin.  5. Chronic systolic CHF: Ischemic cardiomyopathy.  [employer] ICD.  Echo with EF 15-20%, anteroseptal AK,  RV normal, severe LAE, mild MR.  NYHA class II symptoms, not volume overloaded on exam. Symptoms worse in atrial fibrillation. BP is high for her today, generally SBP 90s-110s.  - Will arrange for TEE-DCCV to get back into NSR (as above).  - Increase spironolactone to 25 mg daily, BMET today and again in 10 days.  - Add Farxiga 10 mg daily.   - Continue Entresto 24/26, if BP remains stable will increase in future.  - Continue Coreg 12.5 mg bid.  - EF is quite low at this point, I would like to arrange for eventual LHC/RHC (no cath since 2008).  I would like to look for revascularizable coronary disease and also to assess filling pressures and cardiac output.  I would aim for > 1 month post-DCCV to allow at least a month of continuous anticoagulation post-DCCV.  She will need Lovenox bridge when she does cath.  6. Smoking: Active smoker, wants to stop on her own.  7. PAD: She denies claudication.  - Needs to quit smoking.   Followup with me in 1 month.   [name] 11/29/2020

## 2020-11-29 NOTE — Telephone Encounter (Signed)
I called patient to inform her that an appt was scheduled at [facility] for her pre-procedure COVID test tomorrow at 0930.  She is aware and plans to be there at that time.

## 2020-11-30 ENCOUNTER — Telehealth: Payer: Self-pay | Admitting: *Deleted

## 2020-11-30 ENCOUNTER — Other Ambulatory Visit: Payer: Self-pay

## 2020-11-30 ENCOUNTER — Other Ambulatory Visit (HOSPITAL_COMMUNITY)
Admission: RE | Admit: 2020-11-30 | Discharge: 2020-11-30 | Disposition: A | Discharge status: Home / Self Care | Payer: Medicare PPO | Source: Ambulatory Visit | Attending: Cardiology | Admitting: Cardiology

## 2020-11-30 DIAGNOSIS — Z01812 Encounter for preprocedural laboratory examination: Secondary | ICD-10-CM | POA: Diagnosis not present

## 2020-11-30 DIAGNOSIS — Z20822 Contact with and (suspected) exposure to covid-19: Secondary | ICD-10-CM | POA: Diagnosis not present

## 2020-11-30 LAB — SARS CORONAVIRUS 2 (TAT 6-24 HRS): SARS Coronavirus 2: NEGATIVE

## 2020-11-30 NOTE — Telephone Encounter (Signed)
-----   Message from Noralee Space, RN sent at 11/30/2020 11:40 AM EST ----- Hey yes sorry, that would be fine, thanks ----- Message ----- From: Louanna Raw, RN Sent: 11/28/2020   2:44 PM EST To: Noralee Space, RN  Hey.  I am off on Fridays.  I can put her on at [facility] at 11:45am if you think that will give her enough time?  I would just tell her to get there early.  Letr me know ----- Message ----- From: Noralee Space, RN Sent: 11/28/2020  12:57 PM EST To: Louanna Raw, RN  Hey, we drew an INR today and have sch her for a tee/dccv on Fri 12/3 at 1pm, Dr Shirlee Latch would like her to have a stat INR on Fri 12/3 AM as well, can you reach out and get that sch with you on Fri please, she has to be here at 12, thanks

## 2020-11-30 NOTE — Telephone Encounter (Signed)
Unable to reach pt by phone after multiple attempts.  Left message on cell phone for pt to go to [facility] coumadin clinic tomorrow 12/01/20 at 11:30 for INR check prior to TEE/DCCV.  No answering machine on home phone.

## 2020-11-30 NOTE — Telephone Encounter (Signed)
Pt called back.  States [name] called her and told her to come 15 minutes early for her procedure tomorrow so they could check her INR there.  INR appt at [name] [name] canceled.

## 2020-12-01 ENCOUNTER — Ambulatory Visit (HOSPITAL_BASED_OUTPATIENT_CLINIC_OR_DEPARTMENT_OTHER): Payer: Medicare PPO

## 2020-12-01 ENCOUNTER — Ambulatory Visit (HOSPITAL_COMMUNITY): Payer: Medicare PPO | Admitting: Anesthesiology

## 2020-12-01 ENCOUNTER — Ambulatory Visit (HOSPITAL_COMMUNITY)
Admission: RE | Admit: 2020-12-01 | Discharge: 2020-12-01 | Disposition: A | Discharge status: Home / Self Care | Payer: Medicare PPO | Attending: Cardiology | Admitting: Cardiology

## 2020-12-01 ENCOUNTER — Ambulatory Visit (INDEPENDENT_AMBULATORY_CARE_PROVIDER_SITE_OTHER): Payer: Medicare PPO

## 2020-12-01 ENCOUNTER — Encounter (HOSPITAL_COMMUNITY)
Admission: RE | Disposition: A | Discharge status: Home / Self Care | Payer: Self-pay | Source: Home / Self Care | Attending: Cardiology

## 2020-12-01 ENCOUNTER — Encounter (HOSPITAL_COMMUNITY): Payer: Self-pay | Admitting: Cardiology

## 2020-12-01 ENCOUNTER — Other Ambulatory Visit: Payer: Self-pay

## 2020-12-01 DIAGNOSIS — D6861 Antiphospholipid syndrome: Secondary | ICD-10-CM | POA: Diagnosis not present

## 2020-12-01 DIAGNOSIS — Z79899 Other long term (current) drug therapy: Secondary | ICD-10-CM | POA: Insufficient documentation

## 2020-12-01 DIAGNOSIS — I255 Ischemic cardiomyopathy: Secondary | ICD-10-CM | POA: Diagnosis not present

## 2020-12-01 DIAGNOSIS — Z7901 Long term (current) use of anticoagulants: Secondary | ICD-10-CM | POA: Insufficient documentation

## 2020-12-01 DIAGNOSIS — Z8673 Personal history of transient ischemic attack (TIA), and cerebral infarction without residual deficits: Secondary | ICD-10-CM | POA: Diagnosis not present

## 2020-12-01 DIAGNOSIS — I340 Nonrheumatic mitral (valve) insufficiency: Secondary | ICD-10-CM

## 2020-12-01 DIAGNOSIS — I4892 Unspecified atrial flutter: Secondary | ICD-10-CM | POA: Diagnosis not present

## 2020-12-01 DIAGNOSIS — I4891 Unspecified atrial fibrillation: Secondary | ICD-10-CM

## 2020-12-01 DIAGNOSIS — I4819 Other persistent atrial fibrillation: Secondary | ICD-10-CM | POA: Diagnosis not present

## 2020-12-01 DIAGNOSIS — I110 Hypertensive heart disease with heart failure: Secondary | ICD-10-CM | POA: Insufficient documentation

## 2020-12-01 DIAGNOSIS — Z7982 Long term (current) use of aspirin: Secondary | ICD-10-CM | POA: Insufficient documentation

## 2020-12-01 DIAGNOSIS — F17210 Nicotine dependence, cigarettes, uncomplicated: Secondary | ICD-10-CM | POA: Insufficient documentation

## 2020-12-01 DIAGNOSIS — E785 Hyperlipidemia, unspecified: Secondary | ICD-10-CM | POA: Diagnosis not present

## 2020-12-01 DIAGNOSIS — I639 Cerebral infarction, unspecified: Secondary | ICD-10-CM | POA: Diagnosis not present

## 2020-12-01 DIAGNOSIS — I2510 Atherosclerotic heart disease of native coronary artery without angina pectoris: Secondary | ICD-10-CM | POA: Diagnosis not present

## 2020-12-01 DIAGNOSIS — I5022 Chronic systolic (congestive) heart failure: Secondary | ICD-10-CM | POA: Diagnosis not present

## 2020-12-01 DIAGNOSIS — I739 Peripheral vascular disease, unspecified: Secondary | ICD-10-CM | POA: Insufficient documentation

## 2020-12-01 DIAGNOSIS — I5042 Chronic combined systolic (congestive) and diastolic (congestive) heart failure: Secondary | ICD-10-CM | POA: Diagnosis not present

## 2020-12-01 HISTORY — PX: CARDIOVERSION: SHX1299

## 2020-12-01 HISTORY — PX: TEE WITHOUT CARDIOVERSION: SHX5443

## 2020-12-01 LAB — PROTIME-INR
INR: 3.6 — ABNORMAL HIGH (ref 0.8–1.2)
Prothrombin Time: 34.8 seconds — ABNORMAL HIGH (ref 11.4–15.2)

## 2020-12-01 SURGERY — ECHOCARDIOGRAM, TRANSESOPHAGEAL
Anesthesia: General

## 2020-12-01 MED ORDER — LIDOCAINE 2% (20 MG/ML) 5 ML SYRINGE
INTRAMUSCULAR | Status: DC | PRN
  Administered 2020-12-01: 100 mg via INTRAVENOUS

## 2020-12-01 MED ORDER — PROPOFOL 500 MG/50ML IV EMUL
INTRAVENOUS | Status: DC | PRN
  Administered 2020-12-01: 120 ug/kg/min via INTRAVENOUS

## 2020-12-01 MED ORDER — PHENYLEPHRINE HCL-NACL 10-0.9 MG/250ML-% IV SOLN
INTRAVENOUS | Status: DC | PRN
  Administered 2020-12-01: 60 ug/min via INTRAVENOUS

## 2020-12-01 MED ORDER — PHENYLEPHRINE 40 MCG/ML (10ML) SYRINGE FOR IV PUSH (FOR BLOOD PRESSURE SUPPORT)
PREFILLED_SYRINGE | INTRAVENOUS | Status: DC | PRN
  Administered 2020-12-01 (×2): 80 ug via INTRAVENOUS

## 2020-12-01 MED ORDER — EPHEDRINE SULFATE-NACL 50-0.9 MG/10ML-% IV SOSY
PREFILLED_SYRINGE | INTRAVENOUS | Status: DC | PRN
  Administered 2020-12-01: 10 mg via INTRAVENOUS

## 2020-12-01 MED ORDER — SODIUM CHLORIDE 0.9 % IV SOLN: INTRAVENOUS | Status: DC

## 2020-12-01 MED ORDER — LACTATED RINGERS IV SOLN: INTRAVENOUS | Status: DC | PRN

## 2020-12-01 NOTE — Transfer of Care (Signed)
Immediate Anesthesia Transfer of Care Note  Patient: TEOLA FELIPE  Procedure(s) Performed: TRANSESOPHAGEAL ECHOCARDIOGRAM (TEE) (N/A ) CARDIOVERSION (N/A )  Patient Location: Endoscopy Unit  Anesthesia Type:General  Level of Consciousness: awake  Airway & Oxygen Therapy: Patient Spontanous Breathing  Post-op Assessment: Report given to RN and Post -op Vital signs reviewed and stable  Post vital signs: Reviewed and stable  Last Vitals:  Vitals Value Taken Time  BP 105/58 12/01/20 1357  Temp    Pulse 112 12/01/20 1358  Resp 15 12/01/20 1358  SpO2 99 % 12/01/20 1358  Vitals shown include unvalidated device data.  Last Pain:  Vitals:   12/01/20 1232  TempSrc: Temporal  PainSc: 7          Complications: No complications documented.

## 2020-12-01 NOTE — Interval H&P Note (Signed)
History and Physical Interval Note:  12/01/2020 1:17 PM  [name] [name]  has presented today for surgery, with the diagnosis of A-fib/ A-flutter.  The various methods of treatment have been discussed with the patient and family. After consideration of risks, benefits and other options for treatment, the patient has consented to  Procedure(s): TRANSESOPHAGEAL ECHOCARDIOGRAM (TEE) (N/A) CARDIOVERSION (N/A) as a surgical intervention.  The patient's history has been reviewed, patient examined, no change in status, stable for surgery.  I have reviewed the patient's chart and labs.  Questions were answered to the patient's satisfaction.     [name] [name] [employer]

## 2020-12-01 NOTE — Anesthesia Preprocedure Evaluation (Addendum)
Anesthesia Evaluation  Patient identified by MRN, date of birth, ID band Patient awake    Reviewed: Allergy & Precautions, NPO status , Patient's Chart, lab work & pertinent test results, reviewed documented beta blocker date and time   History of Anesthesia Complications Negative for: history of anesthetic complications  Airway Mallampati: II  TM Distance: >3 FB Neck ROM: Full    Dental  (+) Partial Upper, Dental Advisory Given   Pulmonary Current SmokerPatient did not abstain from smoking.,  11/30/2020 SARS coronavirus NEG   breath sounds clear to auscultation       Cardiovascular hypertension, Pt. on medications and Pt. on home beta blockers (-) angina+ CAD, + Past MI and + Peripheral Vascular Disease  + Cardiac Defibrillator  Rhythm:Irregular Rate:Normal  07/2020 ECHO:  more prominent global LV hypokinesis and at least the anteroseptal wall appears akinetic, EF  15-20%. LV severely decreased function with global hypokinesis, mild MR   Neuro/Psych CVA, No Residual Symptoms    GI/Hepatic GERD  Medicated and Controlled,(+)     substance abuse  ,   Endo/Other  Morbid obesity  Renal/GU negative Renal ROS     Musculoskeletal   Abdominal (+) + obese,   Peds  Hematology coumadin   Anesthesia Other Findings   Reproductive/Obstetrics                            Anesthesia Physical Anesthesia Plan  ASA: III  Anesthesia Plan: General   Post-op Pain Management:    Induction: Intravenous  PONV Risk Score and Plan: 2 and Ondansetron and Treatment may vary due to age or medical condition  Airway Management Planned: Natural Airway and Nasal Cannula  Additional Equipment:   Intra-op Plan:   Post-operative Plan:   Informed Consent: I have reviewed the patients History and Physical, chart, labs and discussed the procedure including the risks, benefits and alternatives for the proposed  anesthesia with the patient or authorized representative who has indicated his/her understanding and acceptance.     Dental advisory given  Plan Discussed with: CRNA and Surgeon  Anesthesia Plan Comments:         Anesthesia Quick Evaluation

## 2020-12-01 NOTE — Anesthesia Postprocedure Evaluation (Signed)
Anesthesia Post Note  Patient: ANALYS RYDEN  Procedure(s) Performed: TRANSESOPHAGEAL ECHOCARDIOGRAM (TEE) (N/A ) CARDIOVERSION (N/A )     Patient location during evaluation: Endoscopy Anesthesia Type: General Level of consciousness: awake and alert, patient cooperative and oriented Pain management: pain level controlled Vital Signs Assessment: post-procedure vital signs reviewed and stable Respiratory status: spontaneous breathing, nonlabored ventilation and respiratory function stable Cardiovascular status: blood pressure returned to baseline and stable Postop Assessment: no apparent nausea or vomiting Anesthetic complications: no   No complications documented.  Last Vitals:  Vitals:   12/01/20 1410 12/01/20 1415  BP: 104/61 117/71  Pulse: (!) 41 62  Resp: 17 18  Temp:    SpO2: 100% 96%    Last Pain:  Vitals:   12/01/20 1415  TempSrc:   PainSc: 5                  Alaric Gladwin,E. Aidon Klemens

## 2020-12-01 NOTE — CV Procedure (Signed)
Procedure: TEE  Indication: Atrial fibrillation  Sedation: Per anesthesiology  Findings: Please see echo section for full report.  The left ventricle is mildly dilated.  Severe hypokinesis to akinesis of the anterior and septal walls, EF 25-30%. Normal RV size with mildly decreased systolic function.  Moderate left atrial enlargement, no LA appendage thrombus.  Mild right atrial enlargement.  No PFO/ASD by color doppler.  There was moderate mitral regurgitation with ERO 0.26 cm^2 by PISA (suspect functional MR). Trileaflet aortic valve with no significant stenosis or regurgitation.  Mild tricuspid regurgitation, peak RV-RA gradient 18 mmHg.  Normal caliber thoracic aorta with no significant plaque.   May proceed to DCCV.   [name] 12/01/2020 1:47 PM

## 2020-12-01 NOTE — Progress Notes (Signed)
  Echocardiogram Echocardiogram Transesophageal has been performed.  Stark Bray Swaim 12/01/2020, 1:56 PM

## 2020-12-01 NOTE — Procedures (Signed)
Electrical Cardioversion Procedure Note [name] [name] [id number] [birth year]  Procedure: Electrical Cardioversion Indications:  Atrial Fibrillation  Procedure Details Consent: Risks of procedure as well as the alternatives and risks of each were explained to the (patient/caregiver).  Consent for procedure obtained. Time Out: Verified patient identification, verified procedure, site/side was marked, verified correct patient position, special equipment/implants available, medications/allergies/relevent history reviewed, required imaging and test results available.  Performed  Patient placed on cardiac monitor, pulse oximetry, supplemental oxygen as necessary.  Sedation given: Propofol per anesthesiology Pacer pads placed anterior and posterior chest.  Cardioverted 1 time(s).  Cardioverted at 200J.  Evaluation Findings: Post procedure EKG shows: NSR Complications: None Patient did tolerate procedure well.   [name] 12/01/2020, 1:47 PM

## 2020-12-01 NOTE — Discharge Instructions (Signed)
Electrical Cardioversion Electrical cardioversion is the delivery of a jolt of electricity to restore a normal rhythm to the heart. A rhythm that is too fast or is not regular keeps the heart from pumping well. In this procedure, sticky patches or metal paddles are placed on the chest to deliver electricity to the heart from a device. This procedure may be done in an emergency if:  There is low or no blood pressure as a result of the heart rhythm.  Normal rhythm must be restored as fast as possible to protect the brain and heart from further damage.  It may save a life. This may also be a scheduled procedure for irregular or fast heart rhythms that are not immediately life-threatening. Tell a health care provider about:  Any allergies you have.  All medicines you are taking, including vitamins, herbs, eye drops, creams, and over-the-counter medicines.  Any problems you or family members have had with anesthetic medicines.  Any blood disorders you have.  Any surgeries you have had.  Any medical conditions you have.  Whether you are pregnant or may be pregnant. What are the risks? Generally, this is a safe procedure. However, problems may occur, including:  Allergic reactions to medicines.  A blood clot that breaks free and travels to other parts of your body.  The possible return of an abnormal heart rhythm within hours or days after the procedure.  Your heart stopping (cardiac arrest). This is rare. What happens before the procedure? Medicines  Your health care provider may have you start taking: ? Blood-thinning medicines (anticoagulants) so your blood does not clot as easily. ? Medicines to help stabilize your heart rate and rhythm.  Ask your health care provider about: ? Changing or stopping your regular medicines. This is especially important if you are taking diabetes medicines or blood thinners. ? Taking medicines such as aspirin and ibuprofen. These medicines can  thin your blood. Do not take these medicines unless your health care provider tells you to take them. ? Taking over-the-counter medicines, vitamins, herbs, and supplements. General instructions  Follow instructions from your health care provider about eating or drinking restrictions.  Plan to have someone take you home from the hospital or clinic.  If you will be going home right after the procedure, plan to have someone with you for 24 hours.  Ask your health care provider what steps will be taken to help prevent infection. These may include washing your skin with a germ-killing soap. What happens during the procedure?   An IV will be inserted into one of your veins.  Sticky patches (electrodes) or metal paddles may be placed on your chest.  You will be given a medicine to help you relax (sedative).  An electrical shock will be delivered. The procedure may vary among health care providers and hospitals. What can I expect after the procedure?  Your blood pressure, heart rate, breathing rate, and blood oxygen level will be monitored until you leave the hospital or clinic.  Your heart rhythm will be watched to make sure it does not change.  You may have some redness on the skin where the shocks were given. Follow these instructions at home:  Do not drive for 24 hours if you were given a sedative during your procedure.  Take over-the-counter and prescription medicines only as told by your health care provider.  Ask your health care provider how to check your pulse. Check it often.  Rest for 48 hours after the procedure or   as told by your health care provider.  Avoid or limit your caffeine use as told by your health care provider.  Keep all follow-up visits as told by your health care provider. This is important. Contact a health care provider if:  You feel like your heart is beating too quickly or your pulse is not regular.  You have a serious muscle cramp that does not go  away. Get help right away if:  You have discomfort in your chest.  You are dizzy or you feel faint.  You have trouble breathing or you are short of breath.  Your speech is slurred.  You have trouble moving an arm or leg on one side of your body.  Your fingers or toes turn cold or blue. Summary  Electrical cardioversion is the delivery of a jolt of electricity to restore a normal rhythm to the heart.  This procedure may be done right away in an emergency or may be a scheduled procedure if the condition is not an emergency.  Generally, this is a safe procedure.  After the procedure, check your pulse often as told by your health care provider. This information is not intended to replace advice given to you by your health care provider. Make sure you discuss any questions you have with your health care provider. Document Revised: 07/19/2019 Document Reviewed: 07/19/2019 Elsevier Patient Education  2020 [employer] Inc[employer]   TEE  YOU HAD AN CARDIAC PROCEDURE TODAY: Refer to the procedure report and other information in the discharge instructions given to you for any specific questions about what was found during the examination. If this information does not answer your questions, please call [facility] office at [phone] to clarify.   DIET: Your first meal following the procedure should be a light meal and then it is ok to progress to your normal diet. A half-sandwich or bowl of soup is an example of a good first meal. Heavy or fried foods are harder to digest and may make you feel nauseous or bloated. Drink plenty of fluids but you should avoid alcoholic beverages for 24 hours. If you had a esophageal dilation, please see attached instructions for diet.   ACTIVITY: Your care partner should take you home directly after the procedure. You should plan to take it easy, moving slowly for the rest of the day. You can resume normal activity the day after the procedure however YOU SHOULD  NOT DRIVE, use power tools, machinery or perform tasks that involve climbing or major physical exertion for 24 hours (because of the sedation medicines used during the test).   SYMPTOMS TO REPORT IMMEDIATELY: A cardiologist can be reached at any hour. Please call [phone] for any of the following symptoms:  Vomiting of blood or coffee ground material  New, significant abdominal pain  New, significant chest pain or pain under the shoulder blades  Painful or persistently difficult swallowing  New shortness of breath  Black, tarry-looking or red, bloody stools  FOLLOW UP:  Please also call with any specific questions about appointments or follow up tests.

## 2020-12-03 ENCOUNTER — Encounter (HOSPITAL_COMMUNITY): Payer: Self-pay | Admitting: Cardiology

## 2020-12-03 LAB — CUP PACEART REMOTE DEVICE CHECK
Battery Remaining Longevity: 78 mo
Battery Remaining Percentage: 86 %
Brady Statistic RA Percent Paced: 0 %
Brady Statistic RV Percent Paced: 4 %
Date Time Interrogation Session: 20211203043000
HighPow Impedance: 52 Ohm
Implantable Lead Implant Date: 20081119
Implantable Lead Implant Date: 20081119
Implantable Lead Location: 753859
Implantable Lead Location: 753860
Implantable Lead Model: 185
Implantable Lead Model: 4136
Implantable Lead Serial Number: 211124
Implantable Lead Serial Number: 28383796
Implantable Pulse Generator Implant Date: 20170303
Lead Channel Impedance Value: 489 Ohm
Lead Channel Impedance Value: 489 Ohm
Lead Channel Pacing Threshold Amplitude: 0.5 V
Lead Channel Pacing Threshold Amplitude: 1 V
Lead Channel Pacing Threshold Pulse Width: 0.5 ms
Lead Channel Pacing Threshold Pulse Width: 0.5 ms
Lead Channel Setting Pacing Amplitude: 2 V
Lead Channel Setting Pacing Amplitude: 2.4 V
Lead Channel Setting Pacing Pulse Width: 0.5 ms
Lead Channel Setting Sensing Sensitivity: 0.6 mV
Pulse Gen Serial Number: 204398

## 2020-12-04 ENCOUNTER — Telehealth: Payer: Self-pay | Admitting: *Deleted

## 2020-12-04 NOTE — Telephone Encounter (Signed)
Patient informed. Copy sent to PCP °

## 2020-12-04 NOTE — Telephone Encounter (Signed)
-----   Message from Netta Neat., NP sent at 12/01/2020  1:39 PM EST ----- All of her lab work looks good Q

## 2020-12-05 ENCOUNTER — Ambulatory Visit (INDEPENDENT_AMBULATORY_CARE_PROVIDER_SITE_OTHER): Payer: Medicare PPO | Admitting: *Deleted

## 2020-12-05 DIAGNOSIS — Z5181 Encounter for therapeutic drug level monitoring: Secondary | ICD-10-CM | POA: Diagnosis not present

## 2020-12-05 DIAGNOSIS — I255 Ischemic cardiomyopathy: Secondary | ICD-10-CM | POA: Diagnosis not present

## 2020-12-05 DIAGNOSIS — G8912 Acute post-thoracotomy pain: Secondary | ICD-10-CM | POA: Diagnosis not present

## 2020-12-05 DIAGNOSIS — I63549 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: Secondary | ICD-10-CM

## 2020-12-05 DIAGNOSIS — D6859 Other primary thrombophilia: Secondary | ICD-10-CM | POA: Diagnosis not present

## 2020-12-05 DIAGNOSIS — G8929 Other chronic pain: Secondary | ICD-10-CM | POA: Diagnosis not present

## 2020-12-05 DIAGNOSIS — M25569 Pain in unspecified knee: Secondary | ICD-10-CM | POA: Diagnosis not present

## 2020-12-05 DIAGNOSIS — M545 Low back pain, unspecified: Secondary | ICD-10-CM | POA: Diagnosis not present

## 2020-12-05 DIAGNOSIS — Z79891 Long term (current) use of opiate analgesic: Secondary | ICD-10-CM | POA: Diagnosis not present

## 2020-12-05 LAB — POCT INR: INR: 5.8 — AB (ref 2.0–3.0)

## 2020-12-05 NOTE — Patient Instructions (Signed)
Has increased pain pill to 4 x day Hold warfarin tonight and tomorrow night, take 1/2 tablet Thursday night then resume 1/2 tablet daily except 1 tablets on Sundays, Tuesdays and Thursdays Has been diagnosed with Alpha Gal.   Keep greens consistant Recheck in 1 week

## 2020-12-07 LAB — ECHO TEE
MV M vel: 4.17 m/s
MV Peak grad: 69.6 mmHg
Radius: 0.7 cm

## 2020-12-12 NOTE — Progress Notes (Signed)
Remote ICD transmission.   

## 2020-12-13 ENCOUNTER — Ambulatory Visit (INDEPENDENT_AMBULATORY_CARE_PROVIDER_SITE_OTHER): Payer: Medicare PPO | Admitting: *Deleted

## 2020-12-13 DIAGNOSIS — D6859 Other primary thrombophilia: Secondary | ICD-10-CM | POA: Diagnosis not present

## 2020-12-13 DIAGNOSIS — I63549 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: Secondary | ICD-10-CM | POA: Diagnosis not present

## 2020-12-13 DIAGNOSIS — Z5181 Encounter for therapeutic drug level monitoring: Secondary | ICD-10-CM | POA: Diagnosis not present

## 2020-12-13 LAB — POCT INR: INR: 2.7 (ref 2.0–3.0)

## 2020-12-13 NOTE — Patient Instructions (Signed)
Continue warfarin 1/2 tablet daily except 1 tablets on Sundays, Tuesdays and Thursdays Has been diagnosed with Alpha Gal.   Keep greens consistant Recheck in 3 weeks

## 2020-12-14 ENCOUNTER — Emergency Department (HOSPITAL_COMMUNITY): Payer: Medicare PPO

## 2020-12-14 ENCOUNTER — Inpatient Hospital Stay (HOSPITAL_COMMUNITY)
Admission: EM | Admit: 2020-12-14 | Discharge: 2020-12-19 | DRG: 286 | Disposition: A | Discharge status: Home / Self Care | Payer: Medicare PPO | Attending: Internal Medicine | Admitting: Internal Medicine

## 2020-12-14 ENCOUNTER — Encounter (HOSPITAL_COMMUNITY): Payer: Self-pay | Admitting: *Deleted

## 2020-12-14 ENCOUNTER — Other Ambulatory Visit: Payer: Self-pay

## 2020-12-14 DIAGNOSIS — R55 Syncope and collapse: Secondary | ICD-10-CM | POA: Diagnosis present

## 2020-12-14 DIAGNOSIS — I959 Hypotension, unspecified: Secondary | ICD-10-CM | POA: Diagnosis present

## 2020-12-14 DIAGNOSIS — I255 Ischemic cardiomyopathy: Secondary | ICD-10-CM | POA: Diagnosis present

## 2020-12-14 DIAGNOSIS — J984 Other disorders of lung: Secondary | ICD-10-CM | POA: Diagnosis not present

## 2020-12-14 DIAGNOSIS — Z79899 Other long term (current) drug therapy: Secondary | ICD-10-CM

## 2020-12-14 DIAGNOSIS — F17200 Nicotine dependence, unspecified, uncomplicated: Secondary | ICD-10-CM | POA: Diagnosis present

## 2020-12-14 DIAGNOSIS — Z880 Allergy status to penicillin: Secondary | ICD-10-CM

## 2020-12-14 DIAGNOSIS — I2510 Atherosclerotic heart disease of native coronary artery without angina pectoris: Secondary | ICD-10-CM | POA: Diagnosis present

## 2020-12-14 DIAGNOSIS — I6521 Occlusion and stenosis of right carotid artery: Secondary | ICD-10-CM | POA: Diagnosis present

## 2020-12-14 DIAGNOSIS — Z20822 Contact with and (suspected) exposure to covid-19: Secondary | ICD-10-CM | POA: Diagnosis present

## 2020-12-14 DIAGNOSIS — I6523 Occlusion and stenosis of bilateral carotid arteries: Secondary | ICD-10-CM | POA: Diagnosis not present

## 2020-12-14 DIAGNOSIS — I252 Old myocardial infarction: Secondary | ICD-10-CM | POA: Diagnosis not present

## 2020-12-14 DIAGNOSIS — I10 Essential (primary) hypertension: Secondary | ICD-10-CM

## 2020-12-14 DIAGNOSIS — E871 Hypo-osmolality and hyponatremia: Secondary | ICD-10-CM | POA: Diagnosis present

## 2020-12-14 DIAGNOSIS — I472 Ventricular tachycardia, unspecified: Secondary | ICD-10-CM

## 2020-12-14 DIAGNOSIS — I2582 Chronic total occlusion of coronary artery: Secondary | ICD-10-CM | POA: Diagnosis present

## 2020-12-14 DIAGNOSIS — E119 Type 2 diabetes mellitus without complications: Secondary | ICD-10-CM | POA: Diagnosis present

## 2020-12-14 DIAGNOSIS — Z7901 Long term (current) use of anticoagulants: Secondary | ICD-10-CM

## 2020-12-14 DIAGNOSIS — Z8673 Personal history of transient ischemic attack (TIA), and cerebral infarction without residual deficits: Secondary | ICD-10-CM | POA: Diagnosis not present

## 2020-12-14 DIAGNOSIS — Z8701 Personal history of pneumonia (recurrent): Secondary | ICD-10-CM

## 2020-12-14 DIAGNOSIS — I5022 Chronic systolic (congestive) heart failure: Secondary | ICD-10-CM

## 2020-12-14 DIAGNOSIS — I340 Nonrheumatic mitral (valve) insufficiency: Secondary | ICD-10-CM | POA: Diagnosis present

## 2020-12-14 DIAGNOSIS — E782 Mixed hyperlipidemia: Secondary | ICD-10-CM | POA: Diagnosis present

## 2020-12-14 DIAGNOSIS — I5023 Acute on chronic systolic (congestive) heart failure: Secondary | ICD-10-CM | POA: Diagnosis present

## 2020-12-14 DIAGNOSIS — I4819 Other persistent atrial fibrillation: Secondary | ICD-10-CM

## 2020-12-14 DIAGNOSIS — D696 Thrombocytopenia, unspecified: Secondary | ICD-10-CM | POA: Diagnosis present

## 2020-12-14 DIAGNOSIS — D6861 Antiphospholipid syndrome: Secondary | ICD-10-CM | POA: Diagnosis present

## 2020-12-14 DIAGNOSIS — K219 Gastro-esophageal reflux disease without esophagitis: Secondary | ICD-10-CM | POA: Diagnosis present

## 2020-12-14 DIAGNOSIS — Z23 Encounter for immunization: Secondary | ICD-10-CM

## 2020-12-14 DIAGNOSIS — I671 Cerebral aneurysm, nonruptured: Secondary | ICD-10-CM | POA: Diagnosis present

## 2020-12-14 DIAGNOSIS — F17210 Nicotine dependence, cigarettes, uncomplicated: Secondary | ICD-10-CM | POA: Diagnosis present

## 2020-12-14 DIAGNOSIS — R7303 Prediabetes: Secondary | ICD-10-CM

## 2020-12-14 DIAGNOSIS — Z7982 Long term (current) use of aspirin: Secondary | ICD-10-CM

## 2020-12-14 DIAGNOSIS — I110 Hypertensive heart disease with heart failure: Secondary | ICD-10-CM | POA: Diagnosis present

## 2020-12-14 DIAGNOSIS — I493 Ventricular premature depolarization: Secondary | ICD-10-CM | POA: Diagnosis present

## 2020-12-14 DIAGNOSIS — I5042 Chronic combined systolic (congestive) and diastolic (congestive) heart failure: Secondary | ICD-10-CM | POA: Diagnosis not present

## 2020-12-14 DIAGNOSIS — Z888 Allergy status to other drugs, medicaments and biological substances status: Secondary | ICD-10-CM

## 2020-12-14 DIAGNOSIS — Z95810 Presence of automatic (implantable) cardiac defibrillator: Secondary | ICD-10-CM | POA: Diagnosis not present

## 2020-12-14 DIAGNOSIS — I7300 Raynaud's syndrome without gangrene: Secondary | ICD-10-CM | POA: Diagnosis present

## 2020-12-14 HISTORY — DX: Cerebral aneurysm, nonruptured: I67.1

## 2020-12-14 HISTORY — DX: Other persistent atrial fibrillation: I48.19

## 2020-12-14 HISTORY — DX: Presence of automatic (implantable) cardiac defibrillator: Z95.810

## 2020-12-14 HISTORY — DX: Other specified health status: Z78.9

## 2020-12-14 HISTORY — DX: Ventricular tachycardia: I47.2

## 2020-12-14 HISTORY — DX: Other ventricular tachycardia: I47.29

## 2020-12-14 LAB — URINALYSIS, ROUTINE W REFLEX MICROSCOPIC
Bilirubin Urine: NEGATIVE
Glucose, UA: 50 mg/dL — AB
Ketones, ur: NEGATIVE mg/dL
Leukocytes,Ua: NEGATIVE
Nitrite: NEGATIVE
Protein, ur: NEGATIVE mg/dL
Specific Gravity, Urine: 1.009 (ref 1.005–1.030)
pH: 5 (ref 5.0–8.0)

## 2020-12-14 LAB — BRAIN NATRIURETIC PEPTIDE: B Natriuretic Peptide: 209 pg/mL — ABNORMAL HIGH (ref 0.0–100.0)

## 2020-12-14 LAB — BASIC METABOLIC PANEL
Anion gap: 8 (ref 5–15)
BUN: 13 mg/dL (ref 6–20)
CO2: 28 mmol/L (ref 22–32)
Calcium: 8.4 mg/dL — ABNORMAL LOW (ref 8.9–10.3)
Chloride: 97 mmol/L — ABNORMAL LOW (ref 98–111)
Creatinine, Ser: 0.85 mg/dL (ref 0.44–1.00)
GFR, Estimated: >60 mL/min (ref >60)
Glucose, Bld: 115 mg/dL — ABNORMAL HIGH (ref 70–99)
Potassium: 3.7 mmol/L (ref 3.5–5.1)
Sodium: 133 mmol/L — ABNORMAL LOW (ref 135–145)

## 2020-12-14 LAB — POC URINE PREG, ED: Preg Test, Ur: NEGATIVE

## 2020-12-14 LAB — CBC
HCT: 42.4 % (ref 36.0–46.0)
Hemoglobin: 13.8 g/dL (ref 12.0–15.0)
MCH: 30.3 pg (ref 26.0–34.0)
MCHC: 32.5 g/dL (ref 30.0–36.0)
MCV: 93 fL (ref 80.0–100.0)
Platelets: 97 10*3/uL — ABNORMAL LOW (ref 150–400)
RBC: 4.56 MIL/uL (ref 3.87–5.11)
RDW: 13 % (ref 11.5–15.5)
WBC: 8.5 10*3/uL (ref 4.0–10.5)
nRBC: 0 % (ref 0.0–0.2)

## 2020-12-14 LAB — CBG MONITORING, ED: Glucose-Capillary: 101 mg/dL — ABNORMAL HIGH (ref 70–99)

## 2020-12-14 LAB — TROPONIN I (HIGH SENSITIVITY): Troponin I (High Sensitivity): 15 ng/L (ref <18)

## 2020-12-14 MED ORDER — ABACAVIR-DOLUTEGRAVIR-LAMIVUD 600-50-300 MG PO TABS: 1.0000 | ORAL_TABLET | Freq: Every day | ORAL | Status: DC

## 2020-12-14 NOTE — ED Provider Notes (Signed)
[facility] EMERGENCY DEPARTMENT Provider Note   CSN: [id number] Arrival date & time: 12/14/20  1807     History CC:  Syncope  [name] [name] is a 59 y.o. 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 fib, antiphospholipid antibody syndrome on chronic Coumadin, status post cardioversion for A. fib 2 weeks ago, presenting to the emergency department episode of syncope.  The patient reports that she was sitting on her couch this evening around 5 PM, and abruptly began to feel lightheaded.  She reports she lost consciousness for "maybe a minute or so".  She woke up on her couch.  There is no falls or significant trauma.  She says she felt woozy but subsequently returned back to her baseline mental status.  She denies lightheadedness.  She denies headache.  She denies nausea or vomiting.  She denies ever having chest pain.  She did not feel any defibrillation in her chest.  She denies history of seizure.  She is currently asymptomatic.  She does report a chronic history of PVCs.  HPI     Past Medical History:  Diagnosis Date  . Aneurysm of internal carotid artery   . Anticardiolipin antibody positive    Chronic Coumadin  . Brain aneurysm    followed by Dr. Corliss Skains  . Chronic combined systolic (congestive) and diastolic (congestive) heart failure (HCC)   . Cocaine abuse in remission (HCC)   . Coronary atherosclerosis of native coronary artery    Previous invasive cardiac testing was done at a facility in [place], [place].  Occluded diagonal, Diffuse LAD disease, Occluded Cx, and non obstructive RCA.   . Essential hypertension   . Headache   . History of pneumonia   . ICD (implantable cardioverter-defibrillator) in place   . Ischemic cardiomyopathy    LVEF 30-35%  . Mixed hyperlipidemia   . NSVT (nonsustained ventricular tachycardia) (HCC)   .  Persistent atrial fibrillation (HCC)   . PVD (peripheral vascular disease) (HCC)   . Raynaud's phenomenon   . Statin intolerance   . Stroke [facility]) 2007   Total occlusion of the right internal carotid    Patient Active Problem List   Diagnosis Date Noted  . Persistent atrial fibrillation (HCC) 12/15/2020  . DM II (diabetes mellitus, type II), controlled (HCC) 12/15/2020  . Chronic systolic CHF (congestive heart failure) (HCC) 12/15/2020  . Right carotid artery occlusion   . Syncope 12/14/2020  . Acute respiratory failure with hypoxia (HCC) 08/11/2020  . Hypokalemia 08/11/2020  . GERD (gastroesophageal reflux disease) 08/11/2020  . Allergic rhinitis 08/11/2020  . SIRS (systemic inflammatory response syndrome) (HCC) 01/06/2019  . HCAP (healthcare-associated pneumonia) 12/31/2018  . H. influenzae infection 12/24/2018  . Influenza A 12/24/2018  . Chest pain 06/18/2017  . Hyponatremia 06/18/2017  . Tobacco dependence 06/18/2017  . 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 Left-sided weakness   . Chronic combined systolic (congestive) and diastolic (congestive) heart failure (HCC) 07/26/2015  . Encounter for therapeutic drug monitoring 02/02/2014  . Peripheral arterial disease (HCC) 11/09/2013  . Dual implantable cardioverter-defibrillator in situ 06/01/2012  . Long term current use of anticoagulant 03/22/2011  . Essential hypertension, benign 05/29/2010  . CHEST PAIN 05/29/2010  . Hyperlipidemia 09/26/2009  . Peripheral vascular disease (HCC) 06/23/2009  . Cardiomyopathy, ischemic 11/29/2008  . CEREBROVASCULAR DISEASE 11/29/2008  . Anticardiolipin  syndrome (HCC) 11/29/2008    Past Surgical History:  Procedure Laterality Date  . ABDOMINAL AORTAGRAM N/A 05/25/2014   Procedure: ABDOMINAL Ronny Flurry;  Surgeon: Iran Ouch, MD;  Location: [facility] CATH LAB;  Service: Cardiovascular;  Laterality: N/A;  . APPENDECTOMY    .  CARDIAC DEFIBRILLATOR PLACEMENT     [employer] ICD  . CARDIOVERSION N/A 12/01/2020   Procedure: CARDIOVERSION;  Surgeon: [name], MD;  Location: [facility] ENDOSCOPY;  Service: Cardiovascular;  Laterality: N/A;  . EP IMPLANTABLE DEVICE N/A 03/01/2016   Procedure: ICD Generator Changeout;  Surgeon: [name], MD;  Location: [facility] INVASIVE CV LAB;  Service: Cardiovascular;  Laterality: N/A;  . L1 corpectomy   12/10  . Laryngeal polyp excision    . TEE WITHOUT CARDIOVERSION N/A 12/01/2020   Procedure: TRANSESOPHAGEAL ECHOCARDIOGRAM (TEE);  Surgeon: [name], MD;  Location: [facility] ENDOSCOPY;  Service: Cardiovascular;  Laterality: N/A;     OB History   No obstetric history on file.     Family History  Problem Relation Age of Onset  . Heart disease Father 71  . Ankylosing spondylitis Sister   . Stomach cancer Brother 65  . Heart attack Brother     Social History   Tobacco Use  . Smoking status: Current Every Day Smoker    Packs/day: 1.00    Years: 43.00    Pack years: 43.00    Types: Cigarettes    Start date: 03/03/1978    Last attempt to quit: 10/03/2013    Years since quitting: 7.2  . Smokeless tobacco: Never Used  . Tobacco comment: 4 ciggs per day  Vaping Use  . Vaping Use: Every day  Substance Use Topics  . Alcohol use: No    Alcohol/week: 0.0 standard drinks  . Drug use: No    Comment: Prior history of cocaine    Home Medications Prior to Admission medications   Medication Sig Start Date End Date Taking? Authorizing Provider  aspirin EC 81 MG tablet Take 81 mg by mouth daily. Swallow whole.   Yes [provider]  carvedilol (COREG) 12.5 MG tablet TAKE 1 TABLET BY MOUTH TWICE DAILY WITH MEALS Patient taking differently: Take 12.5 mg by mouth 2 (two) times daily with a meal. 06/21/20  Yes [name], MD  dapagliflozin propanediol (FARXIGA) 10 MG TABS tablet Take 1 tablet (10 mg total) by mouth daily before breakfast. 11/28/20  Yes [name], MD   digoxin (LANOXIN) 0.125 MG tablet Take 1 tablet (0.125 mg total) by mouth daily. 08/15/20 11/17/21 Yes Shah, Pratik D, DO  DULoxetine (CYMBALTA) 30 MG capsule Take 30 mg by mouth daily.   Yes [provider]  DULoxetine (CYMBALTA) 60 MG capsule Take 60 mg by mouth every evening.   Yes [provider]  ezetimibe (ZETIA) 10 MG tablet Take 1 tablet (10 mg total) by mouth daily. 08/16/20 11/17/21 Yes Shah, Pratik D, DO  famotidine (PEPCID) 20 MG tablet Take 1 tablet (20 mg total) by mouth 2 (two) times daily. Patient taking differently: Take 20 mg by mouth daily. 01/06/19 08/25/21 Yes Kari Baars, MD  gabapentin (NEURONTIN) 300 MG capsule Take 300 mg by mouth See admin instructions. Take 300 mg  in the AM and 600 mg  in the PM   Yes [provider]  Multiple Vitamins-Minerals (MULTIVITAMINS THER. W/MINERALS) TABS Take 1 tablet by mouth daily.     Yes [provider]  oxyCODONE-acetaminophen (PERCOCET) 10-325 MG tablet Take 1 tablet by  mouth every 4 (four) hours as needed for pain.   Yes [provider]  potassium chloride SA (KLOR-CON) 20 MEQ tablet TAKE 1 TABLET BY MOUTH EVERY DAY Patient taking differently: Take 20 mEq by mouth daily. 08/10/20  Yes [name], MD  promethazine (PHENERGAN) 25 MG tablet Take 25 mg by mouth every 6 (six) hours as needed for nausea. With pain medication   Yes [provider]  sacubitril-valsartan (ENTRESTO) 24-26 MG Take 1 tablet by mouth 2 (two) times daily. 10/28/20  Yes Leone Brand, NP  spironolactone (ALDACTONE) 25 MG tablet Take 1 tablet (25 mg total) by mouth daily. Patient taking differently: Take 25 mg by mouth at bedtime. 11/28/20  Yes [name], MD  torsemide (DEMADEX) 20 MG tablet Take 4 tablets (80 mg total) by mouth daily. May take extra tablet as needed 09/21/20 11/17/21 Yes [name], MD  warfarin (COUMADIN) 10 MG tablet TAKE 1 TABLET BY MOUTH EVERY DAY EXCEPT 1/2 TABLET ON  MONDAYS AND THURSDAYS Patient taking differently: Take 10 mg by mouth See admin instructions. Take 5 mg Mon. Wed, Friday , and Saturday All the other days take 10 mg in the evening 10/09/20  Yes [name], MD  fluticasone [facility]) 50 MCG/ACT nasal spray Place 1 spray into both nostrils daily as needed for allergies.  09/24/19   [provider]  nitroGLYCERIN (NITROSTAT) 0.4 MG SL tablet PLACE 1 TABLET UNDER THE TONGUE EVERY 5 MINUTES FOR 3 DOSES AS NEEDED CHEST PAIN Patient taking differently: Place 0.4 mg under the tongue every 5 (five) minutes as needed for chest pain. 05/04/19   [name], MD    Allergies    Beef-derived products, Metoclopramide hcl, Penicillins, Pork-derived products, Metoprolol, Reglan [metoclopramide], and Statins  Review of Systems   Review of Systems  Constitutional: Negative for chills and fever.  Eyes: Negative for pain and visual disturbance.  Respiratory: Negative for cough and shortness of breath.   Cardiovascular: Negative for chest pain and palpitations.  Gastrointestinal: Negative for abdominal pain, nausea and vomiting.  Musculoskeletal: Negative for arthralgias and back pain.  Skin: Negative for color change and rash.  Neurological: Positive for syncope and light-headedness. Negative for seizures, speech difficulty, weakness and numbness.  Psychiatric/Behavioral: Negative for agitation and confusion.  All other systems reviewed and are negative.   Physical Exam Updated Vital Signs BP (!) 106/46   Pulse (!) 56   Temp (!) 97.4 F (36.3 C) (Oral)   Resp 16   SpO2 99%   Physical Exam Vitals and nursing note reviewed.  Constitutional:      General: She is not in acute distress.    Appearance: She is well-developed and well-nourished.  HENT:     Head: Normocephalic and atraumatic.  Eyes:     Conjunctiva/sclera: Conjunctivae normal.  Cardiovascular:     Rate and Rhythm: Normal rate and regular rhythm.     Pulses: Normal  pulses.     Comments: Frequent PVC's on monitor Pulmonary:     Effort: Pulmonary effort is normal. No respiratory distress.     Breath sounds: Normal breath sounds.  Abdominal:     Palpations: Abdomen is soft.     Tenderness: There is no abdominal tenderness.  Musculoskeletal:        General: No edema.     Cervical back: Neck supple.  Skin:    General: Skin is warm and dry.  Neurological:     General: No focal deficit present.  Mental Status: She is alert and oriented to person, place, and time.     Cranial Nerves: No cranial nerve deficit.     Sensory: No sensory deficit.  Psychiatric:        Mood and Affect: Mood and affect and mood normal.        Behavior: Behavior normal.     ED Results / Procedures / Treatments   Labs (all labs ordered are listed, but only abnormal results are displayed) Labs Reviewed  BASIC METABOLIC PANEL - Abnormal; Notable for the following components:      Result Value   Sodium 133 (*)    Chloride 97 (*)    Glucose, Bld 115 (*)    Calcium 8.4 (*)    All other components within normal limits  CBC - Abnormal; Notable for the following components:   Platelets 97 (*)    All other components within normal limits  URINALYSIS, ROUTINE W REFLEX MICROSCOPIC - Abnormal; Notable for the following components:   Glucose, UA 50 (*)    Hgb urine dipstick SMALL (*)    Bacteria, UA RARE (*)    All other components within normal limits  BRAIN NATRIURETIC PEPTIDE - Abnormal; Notable for the following components:   B Natriuretic Peptide 209.0 (*)    All other components within normal limits  COMPREHENSIVE METABOLIC PANEL - Abnormal; Notable for the following components:   Sodium 134 (*)    Calcium 8.6 (*)    Albumin 3.3 (*)    All other components within normal limits  CBC - Abnormal; Notable for the following components:   Platelets 97 (*)    All other components within normal limits  PROTIME-INR - Abnormal; Notable for the following components:    Prothrombin Time 22.7 (*)    INR 2.1 (*)    All other components within normal limits  APTT - Abnormal; Notable for the following components:   aPTT 50 (*)    All other components within normal limits  HEMOGLOBIN A1C - Abnormal; Notable for the following components:   Hgb A1c MFr Bld 5.9 (*)    All other components within normal limits  FIBRINOGEN - Abnormal; Notable for the following components:   Fibrinogen 581 (*)    All other components within normal limits  CBG MONITORING, ED - Abnormal; Notable for the following components:   Glucose-Capillary 101 (*)    All other components within normal limits  TROPONIN I (HIGH SENSITIVITY) - Abnormal; Notable for the following components:   Troponin I (High Sensitivity) 18 (*)    All other components within normal limits  RESP PANEL BY RT-PCR (FLU A&B, COVID) ARPGX2  MAGNESIUM  PHOSPHORUS  VITAMIN B12  FOLATE  TSH  T4, FREE  DIGOXIN LEVEL  RAPID URINE DRUG SCREEN, HOSP PERFORMED  POC URINE PREG, ED  CBG MONITORING, ED  CBG MONITORING, ED  TROPONIN I (HIGH SENSITIVITY)  TROPONIN I (HIGH SENSITIVITY)    EKG EKG Interpretation  Date/Time:  Thursday December 14 2020 19:25:25 EST Ventricular Rate:  88 PR Interval:    QRS Duration: 109 QT Interval:  385 QTC Calculation: 404 R Axis:   69 Text Interpretation: Sinus rhythm Multiform ventricular premature complexes Prolonged PR interval No STEMI Confirmed by Alvester Chou [phone]) on 12/14/2020 8:49:26 PM   Radiology DG Chest Portable 1 View  Result Date: 12/14/2020 CLINICAL DATA:  Syncope syncope EXAM: PORTABLE CHEST 1 VIEW COMPARISON:  08/11/2020 FINDINGS: Left AICD remains in place, unchanged. Cardiomegaly. Linear areas of scarring  in the mid lungs bilaterally. No confluent opacities, effusions or edema. IMPRESSION: Cardiomegaly.  Chronic changes.  No active disease. Electronically Signed   By: Charlett Nose M.D.   On: 12/14/2020 23:32    Procedures Procedures (including  critical care time)  Medications Ordered in ED Medications  aspirin EC tablet 81 mg (81 mg Oral Given 12/15/20 0902)  digoxin (LANOXIN) tablet 0.125 mg (0.125 mg Oral Given 12/15/20 0952)  ezetimibe (ZETIA) tablet 10 mg (10 mg Oral Given 12/15/20 0952)  famotidine (PEPCID) tablet 20 mg (20 mg Oral Given 12/15/20 0900)  potassium chloride SA (KLOR-CON) CR tablet 20 mEq (20 mEq Oral Given 12/15/20 0859)  insulin aspart (novoLOG) injection 0-9 Units (0 Units Subcutaneous Not Given 12/15/20 0821)  insulin aspart (novoLOG) injection 0-5 Units (0 Units Subcutaneous Not Given 12/15/20 0239)  acetaminophen (TYLENOL) tablet 650 mg (has no administration in time range)  oxyCODONE-acetaminophen (PERCOCET/ROXICET) 5-325 MG per tablet 1 tablet (1 tablet Oral Given 12/15/20 0806)  warfarin (COUMADIN) tablet 5 mg (has no administration in time range)  Warfarin - Pharmacist Dosing Inpatient (has no administration in time range)  sodium chloride 0.9 % bolus 250 mL (0 mLs Intravenous Stopped 12/15/20 1042)  potassium chloride SA (KLOR-CON) CR tablet 20 mEq (20 mEq Oral Given 12/15/20 1038)    ED Course  I have reviewed the triage vital signs and the nursing notes.  Pertinent labs & imaging results that were available during my care of the patient were reviewed by me and considered in my medical decision making (see chart for details).  This patient complains of syncope. This involves an extensive number of treatment options, and is a complaint that carries with it a high risk of complications and morbidity.  The differential diagnosis includes arrhythmia vs anemia vs ACS vs orthostatic hypotension vs other  I ordered, reviewed, and interpreted labs, which included troponin 18 (baseline 15), Flu/covid negative, BMP and CBC largely unremarkable, UA without clear evidence of infection I ordered imaging studies which included dg chest  I independently visualized and interpreted imaging which showed stable  cardiomegaly and the monitor tracing which showed sinus rhythm with frequent PVC's Previous records obtained and reviewed showing cardiology office notes and workup I consulted cardiology and discussed lab and imaging findings  After the interventions stated above, I reevaluated the patient and found she remained asymptomatic.  Clinical Course as of 12/15/20 1047  Thu Dec 14, 2020  2230 Paged cardiology via carelink.  Nursing has attempted to contact [employer] and informed that there are technical problems they aren't receiving transmissions from her device [MT]  2318 Signed out to hospitalist. Discussed with cardiologist on call at [facility] Dr Joyce Gross who recommended telemetry observation overnight, BS team to sent rep for device interrogation in the morning.  Pt amenable to staying for this.  K is normal here.  Trop added on per cardiology recommendation, although she has no chest pain, SOB, or any other acute symptoms to suggest ischemia at this point.  She is breathing comfortably on room air with no obvious signs of CHF exacerbation. [MT]    Clinical Course User Index [MT] Jossalin Chervenak, Kermit Balo, MD    Final Clinical Impression(s) / ED Diagnoses Final diagnoses:  Syncope  Right carotid artery occlusion    Rx / DC Orders ED Discharge Orders    None       Terald Sleeper, MD 12/15/20 1049

## 2020-12-14 NOTE — ED Triage Notes (Signed)
Syncopal episode

## 2020-12-14 NOTE — ED Triage Notes (Signed)
States she recently had a cardioversion, has a low ejection fraction

## 2020-12-14 NOTE — ED Notes (Signed)
Waiting for pharmacy to verify [facility]

## 2020-12-14 NOTE — H&P (Signed)
History and Physical  [name] [name]:[id number] DOB: [birth year] DOA: 12/14/2020  Referring physician: Terald Sleeper, MD PCP: Ignatius Specking, MD  Patient coming from: Home  Chief Complaint: I have passed out at home  HPI: [name] [name] is a 59 y.o. 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 fib, tobacco abuse and s/p cardioversion for A. fib on 12/3 who presents to the emergency department due to a syncopal episode sustained at home earlier today.  Patient states that she was sitting on a couch in the living room talking with a friend around 5 PM, when she suddenly became lightheaded and became unconscious for possibly 1 minute or so.  Patient states that the friend who witnessed the episode states that she passed out.  Patient states that she regained consciousness immediately and there was no postictal state, no biting of tongue, lips or bowel or urinary incontinence.  She denies fever, chills, headache, chest pain or shortness of breath.  Patient endorsed history of chronic PVCs. She was admitted to this facility from 8/13-8/17 due to acute respiratory failure with hypoxia secondary to cute on chronic systolic heart failure.  ED Course:  In the emergency department, she was hemodynamically stable.  Work-up in the ED showed normal CBC and BMP except for thrombocytopenia and mild hyponatremia.  BNP 209 (this was 571 about 4 months ago).  Troponin x1=15.  Chest x-ray showed cardiomegaly with chronic changes but no active disease.  An attempt to interrogate patient's pacemaker/defibrillator was unsuccessful.  Cardiology fellow at [facility] was consulted and recommended to attempt the interrogation again in the morning without any need of patient being transferred to [facility] at this time per ED physician.  Hospitalist was asked to admit  patient for further evaluation and management.  Review of Systems: Constitutional: Negative for chills and fever.  HENT: Negative for ear pain and sore throat.   Eyes: Negative for pain and visual disturbance.  Respiratory: Negative for cough, chest tightness and shortness of breath.   Cardiovascular: Positive for syncope.  Negative for chest pain and palpitations.  Gastrointestinal: Negative for abdominal pain and vomiting.  Endocrine: Negative for polyphagia and polyuria.  Genitourinary: Negative for decreased urine volume, dysuria, enuresis, hematuria Musculoskeletal: Negative for arthralgias and back pain.  Skin: Negative for color change and rash.  Allergic/Immunologic: Negative for immunocompromised state.  Neurological: Negative for tremors, syncope, speech difficulty, weakness, light-headedness and headaches.  Hematological: Does not bruise/bleed easily.  All other systems reviewed and are negative    Past Medical History:  Diagnosis Date  . Aneurysm of internal carotid artery   . Anticardiolipin antibody positive    Chronic Coumadin  . Chronic combined systolic (congestive) and diastolic (congestive) heart failure (HCC)   . Cocaine abuse in remission (HCC)   . Coronary atherosclerosis of native coronary artery    Previous invasive cardiac testing was done at a facility in [place], [place].  Occluded diagonal, Diffuse LAD disease, Occluded Cx, and non obstructive RCA.   . Essential hypertension   . Headache   . History of pneumonia   . Ischemic cardiomyopathy    LVEF 30-35%  . Mixed hyperlipidemia   . PVD (peripheral vascular disease) (HCC)   . Raynaud's phenomenon   . Stroke [facility]) 2007   Total occlusion of the right internal carotid   Past Surgical History:  Procedure Laterality Date  . ABDOMINAL  AORTAGRAM N/A 05/25/2014   Procedure: ABDOMINAL Ronny Flurry;  Surgeon: Iran Ouch, MD;  Location: [facility] CATH LAB;  Service: Cardiovascular;  Laterality: N/A;  .  APPENDECTOMY    . CARDIAC DEFIBRILLATOR PLACEMENT     [employer] ICD  . CARDIOVERSION N/A 12/01/2020   Procedure: CARDIOVERSION;  Surgeon: [name], MD;  Location: [facility] ENDOSCOPY;  Service: Cardiovascular;  Laterality: N/A;  . EP IMPLANTABLE DEVICE N/A 03/01/2016   Procedure: ICD Generator Changeout;  Surgeon: [name], MD;  Location: [facility] INVASIVE CV LAB;  Service: Cardiovascular;  Laterality: N/A;  . L1 corpectomy   12/10  . Laryngeal polyp excision    . TEE WITHOUT CARDIOVERSION N/A 12/01/2020   Procedure: TRANSESOPHAGEAL ECHOCARDIOGRAM (TEE);  Surgeon: [name], MD;  Location[facility] ENDOSCOPY;  Service: Cardiovascular;  Laterality: N/A;    Social History:  reports that she has been smoking cigarettes. She started smoking about 42 years ago. She has a 43.00 pack-year smoking history. She has never used smokeless tobacco. She reports that she does not drink alcohol and does not use drugs.   Allergies  Allergen Reactions  . Beef-Derived Products Anaphylaxis    From tick bite  . Metoclopramide Hcl Anaphylaxis    Non responsive  . Penicillins Anaphylaxis    Pt states she can take any cephalosporin (per pt. 01/01/19) Shock Has patient had a PCN reaction causing immediate rash, facial/tongue/throat swelling, SOB or lightheadedness with hypotension: Yes Has patient had a PCN reaction causing severe rash involving mucus membranes or skin necrosis: No Has patient had a PCN reaction that required hospitalization Yes Has patient had a PCN reaction occurring within the last 10 years: No If all of the above answers are "NO", then may proceed with Cephalosporin   . Pork-Derived Products Anaphylaxis    From tick bite  . Metoprolol Other (See Comments)    Syncope   . Reglan [Metoclopramide]   . Statins Other (See Comments)    Myalgias    Family History  Problem Relation Age of Onset  . Heart disease Father 30  . Ankylosing spondylitis Sister   . Stomach cancer Brother 8  .  Heart attack Brother     Prior to Admission medications   Medication Sig Start Date End Date Taking? Authorizing Provider  aspirin EC 81 MG tablet Take 81 mg by mouth daily. Swallow whole.   Yes [provider]  carvedilol (COREG) 12.5 MG tablet TAKE 1 TABLET BY MOUTH TWICE DAILY WITH MEALS Patient taking differently: Take 12.5 mg by mouth 2 (two) times daily with a meal. 06/21/20  Yes [name], MD  dapagliflozin propanediol (FARXIGA) 10 MG TABS tablet Take 1 tablet (10 mg total) by mouth daily before breakfast. 11/28/20  Yes [name], MD  digoxin (LANOXIN) 0.125 MG tablet Take 1 tablet (0.125 mg total) by mouth daily. 08/15/20 11/17/21 Yes Shah, Pratik D, DO  DULoxetine (CYMBALTA) 30 MG capsule Take 30 mg by mouth daily.   Yes [provider]  DULoxetine (CYMBALTA) 60 MG capsule Take 60 mg by mouth every evening.   Yes [provider]  ezetimibe (ZETIA) 10 MG tablet Take 1 tablet (10 mg total) by mouth daily. 08/16/20 11/17/21 Yes Shah, Pratik D, DO  famotidine (PEPCID) 20 MG tablet Take 1 tablet (20 mg total) by mouth 2 (two) times daily. Patient taking differently: Take 20 mg by mouth daily. 01/06/19 08/25/21 Yes Kari Baars, MD  gabapentin (NEURONTIN) 300 MG capsule Take 300 mg by  mouth See admin instructions. Take 300 mg  in the AM and 600 mg  in the PM   Yes [provider]  Multiple Vitamins-Minerals (MULTIVITAMINS THER. W/MINERALS) TABS Take 1 tablet by mouth daily.     Yes [provider]  oxyCODONE-acetaminophen (PERCOCET) 10-325 MG tablet Take 1 tablet by mouth every 4 (four) hours as needed for pain.   Yes [provider]  potassium chloride SA (KLOR-CON) 20 MEQ tablet TAKE 1 TABLET BY MOUTH EVERY DAY Patient taking differently: Take 20 mEq by mouth daily. 08/10/20  Yes [name], MD  promethazine (PHENERGAN) 25 MG tablet Take 25 mg by mouth every 6 (six) hours as needed for nausea. With pain medication   Yes  [provider]  sacubitril-valsartan (ENTRESTO) 24-26 MG Take 1 tablet by mouth 2 (two) times daily. 10/28/20  Yes Leone Brand, NP  spironolactone (ALDACTONE) 25 MG tablet Take 1 tablet (25 mg total) by mouth daily. Patient taking differently: Take 25 mg by mouth at bedtime. 11/28/20  Yes [name], MD  torsemide (DEMADEX) 20 MG tablet Take 4 tablets (80 mg total) by mouth daily. May take extra tablet as needed 09/21/20 11/17/21 Yes [name], MD  warfarin (COUMADIN) 10 MG tablet TAKE 1 TABLET BY MOUTH EVERY DAY EXCEPT 1/2 TABLET ON MONDAYS AND THURSDAYS Patient taking differently: Take 10 mg by mouth See admin instructions. Take 5 mg Mon. Wed, Friday , and Saturday All the other days take 10 mg in the evening 10/09/20  Yes [name], MD  fluticasone [facility]) 50 MCG/ACT nasal spray Place 1 spray into both nostrils daily as needed for allergies.  09/24/19   [provider]  nitroGLYCERIN (NITROSTAT) 0.4 MG SL tablet PLACE 1 TABLET UNDER THE TONGUE EVERY 5 MINUTES FOR 3 DOSES AS NEEDED CHEST PAIN Patient taking differently: Place 0.4 mg under the tongue every 5 (five) minutes as needed for chest pain. 05/04/19   [name], MD    Physical Exam: BP (!) 121/57   Pulse 68   Temp (!) 97.4 F (36.3 C) (Oral)   Resp (!) 24   SpO2 98%   . General: 59 y.o. year-old female well developed well nourished in no acute distress.  Alert and oriented x3. Marland Kitchen HEENT: NCAT, EOMI . Neck: Supple, trachea medial . Cardiovascular: Regular rate and rhythm with no rubs or gallops.  No thyromegaly or JVD noted.  No lower extremity edema. 2/4 pulses in all 4 extremities. Marland Kitchen Respiratory: Clear to auscultation with no wheezes or rales. Good inspiratory effort. . Abdomen: Soft nontender nondistended with normal bowel sounds x4 quadrants. . Muskuloskeletal: No cyanosis, clubbing or edema noted bilaterally . Neuro: CN II-XII intact, strength, sensation, reflexes . Skin: No  ulcerative lesions noted or rashes . Psychiatry: Judgement and insight appear normal. Mood is appropriate for condition and setting          Labs on Admission:  Basic Metabolic Panel: Recent Labs  Lab 12/14/20 1921  NA 133*  K 3.7  CL 97*  CO2 28  GLUCOSE 115*  BUN 13  CREATININE 0.85  CALCIUM 8.4*   Liver Function Tests: No results for input(s): AST, ALT, ALKPHOS, BILITOT, PROT, ALBUMIN in the last 168 hours. No results for input(s): LIPASE, AMYLASE in the last 168 hours. No results for input(s): AMMONIA in the last 168 hours. CBC: Recent Labs  Lab 12/14/20 1921  WBC 8.5  HGB 13.8  HCT 42.4  MCV 93.0  PLT 97*   Cardiac Enzymes: No results for input(s): CKTOTAL, CKMB, CKMBINDEX, TROPONINI in the last 168 hours.  BNP (last 3 results) Recent Labs    08/11/20 0230 12/14/20 1921  BNP 571.0* 209.0*    ProBNP (last 3 results) No results for input(s): PROBNP in the last 8760 hours.  CBG: Recent Labs  Lab 12/14/20 2002  GLUCAP 101*    Radiological Exams on Admission: DG Chest Portable 1 View  Result Date: 12/14/2020 CLINICAL DATA:  Syncope syncope EXAM: PORTABLE CHEST 1 VIEW COMPARISON:  08/11/2020 FINDINGS: Left AICD remains in place, unchanged. Cardiomegaly. Linear areas of scarring in the mid lungs bilaterally. No confluent opacities, effusions or edema. IMPRESSION: Cardiomegaly.  Chronic changes.  No active disease. Electronically Signed   By: Charlett Nose M.D.   On: 12/14/2020 23:32    EKG: I independently viewed the EKG done and my findings are as followed: Normal sinus rhythm at a rate of 88 bpm with multiform VPCs  Assessment/Plan Present on Admission: . Syncope . Chronic combined systolic (congestive) and diastolic (congestive) heart failure (HCC) . Tobacco dependence . Essential hypertension, benign . Anticardiolipin syndrome (HCC) . Hyponatremia . GERD (gastroesophageal reflux disease)  Principal Problem:   Syncope Active Problems:    Essential hypertension, benign   Anticardiolipin syndrome (HCC)   Chronic combined systolic (congestive) and diastolic (congestive) heart failure (HCC)   Hyponatremia   Tobacco dependence   GERD (gastroesophageal reflux disease)   Persistent atrial fibrillation (HCC)   DM II (diabetes mellitus, type II), controlled (HCC)   Syncope  Patient will be admitted to telemetry monitored unit, watch for arrhythmias Troponins x 1 = 15 EKG showed normal sinus rhythm at rate of 88 bpm with multifocal VPCs.   Echocardiogram done on 12/3 was as shown below.   An attempt to interrogate cardiac device in the ED was unsuccessful, this will be repeated in the morning Consider cardiology consult based on findings  Chronic, systolic and diastolic CHF Continue aspirin, Coreg, Entresto, Aldactone, torsemide  Persistent A. Fib Continue Coreg, digoxin, Coumadin  Hyponatremia possibly due to diuretic effect Na 132, continue to monitor sodium levels.  Anticardiolipin syndrome Continue warfarin and continue to monitor INR (currently at 2.7)  Essential hypertension Continue Lasix, Coreg, Aldactone  Hyperlipidemia Continue ezetimibe  GERD Continue famotidine  Type 2 diabetes mellitus Continue insulin sliding scale and hypoglycemic protocol Home farxiga will be temporarily held Hemoglobin A1c will be checked   Echocardiogram done was done on 12/01/2020  IMPRESSIONS   1. Left ventricular ejection fraction, by estimation, is 25 to 30%. The  left ventricle has severely decreased function. The left ventricle  demonstrates regional wall motion abnormalities with severe hypokinesis to  akinesis of the anterior and septal  walls. The left ventricular internal cavity size was mildly dilated.  2. Right ventricular systolic function is mildly reduced. The right  ventricular size is normal.  3. Left atrial size was moderately dilated. No left atrial/left atrial  appendage thrombus was detected.   4. Right atrial size was mildly dilated.  5. There was moderate mitral regurgitation with ERO 0.26 cm^2 by PISA  (suspect functional MR). No evidence of mitral stenosis.  6. No PFO/ASD by color doppler.  7. The aortic valve is tricuspid. Aortic valve regurgitation is not  visualized. No aortic stenosis is present.  8. Peak RV-RA gradient 18 mmHg.  9. Normal caliber thoracic aorta with no significant plaque.    DVT prophylaxis: Warfarin  Code Status: Full code  Family  Communication: None at bedside  Disposition Plan:  Patient is from:                        home Anticipated DC to:                   home Anticipated DC date:               2-3 days Anticipated DC barriers:           Patient is unstable to be discharged at this time due to syncopal episode that requires further work-up.  Consults called: None  Admission status: Observation    Frankey Shown MD [facility]  12/15/2020, 1:46 AM

## 2020-12-15 ENCOUNTER — Encounter (HOSPITAL_COMMUNITY): Payer: Self-pay | Admitting: Internal Medicine

## 2020-12-15 ENCOUNTER — Observation Stay (HOSPITAL_COMMUNITY): Payer: Medicare PPO

## 2020-12-15 DIAGNOSIS — I110 Hypertensive heart disease with heart failure: Secondary | ICD-10-CM | POA: Diagnosis present

## 2020-12-15 DIAGNOSIS — I5042 Chronic combined systolic (congestive) and diastolic (congestive) heart failure: Secondary | ICD-10-CM | POA: Diagnosis not present

## 2020-12-15 DIAGNOSIS — F17210 Nicotine dependence, cigarettes, uncomplicated: Secondary | ICD-10-CM | POA: Diagnosis present

## 2020-12-15 DIAGNOSIS — D6861 Antiphospholipid syndrome: Secondary | ICD-10-CM | POA: Diagnosis present

## 2020-12-15 DIAGNOSIS — I2510 Atherosclerotic heart disease of native coronary artery without angina pectoris: Secondary | ICD-10-CM | POA: Diagnosis present

## 2020-12-15 DIAGNOSIS — R7303 Prediabetes: Secondary | ICD-10-CM

## 2020-12-15 DIAGNOSIS — I252 Old myocardial infarction: Secondary | ICD-10-CM | POA: Diagnosis not present

## 2020-12-15 DIAGNOSIS — Z8701 Personal history of pneumonia (recurrent): Secondary | ICD-10-CM | POA: Diagnosis not present

## 2020-12-15 DIAGNOSIS — E119 Type 2 diabetes mellitus without complications: Secondary | ICD-10-CM | POA: Diagnosis present

## 2020-12-15 DIAGNOSIS — Z20822 Contact with and (suspected) exposure to covid-19: Secondary | ICD-10-CM | POA: Diagnosis present

## 2020-12-15 DIAGNOSIS — I340 Nonrheumatic mitral (valve) insufficiency: Secondary | ICD-10-CM | POA: Diagnosis present

## 2020-12-15 DIAGNOSIS — I472 Ventricular tachycardia, unspecified: Secondary | ICD-10-CM

## 2020-12-15 DIAGNOSIS — Z23 Encounter for immunization: Secondary | ICD-10-CM | POA: Diagnosis present

## 2020-12-15 DIAGNOSIS — I6521 Occlusion and stenosis of right carotid artery: Secondary | ICD-10-CM

## 2020-12-15 DIAGNOSIS — E782 Mixed hyperlipidemia: Secondary | ICD-10-CM | POA: Diagnosis present

## 2020-12-15 DIAGNOSIS — K219 Gastro-esophageal reflux disease without esophagitis: Secondary | ICD-10-CM | POA: Diagnosis present

## 2020-12-15 DIAGNOSIS — I959 Hypotension, unspecified: Secondary | ICD-10-CM

## 2020-12-15 DIAGNOSIS — I4819 Other persistent atrial fibrillation: Secondary | ICD-10-CM

## 2020-12-15 DIAGNOSIS — E871 Hypo-osmolality and hyponatremia: Secondary | ICD-10-CM | POA: Diagnosis present

## 2020-12-15 DIAGNOSIS — I5023 Acute on chronic systolic (congestive) heart failure: Secondary | ICD-10-CM | POA: Diagnosis present

## 2020-12-15 DIAGNOSIS — I5022 Chronic systolic (congestive) heart failure: Secondary | ICD-10-CM | POA: Diagnosis not present

## 2020-12-15 DIAGNOSIS — Z8673 Personal history of transient ischemic attack (TIA), and cerebral infarction without residual deficits: Secondary | ICD-10-CM | POA: Diagnosis not present

## 2020-12-15 DIAGNOSIS — I2582 Chronic total occlusion of coronary artery: Secondary | ICD-10-CM | POA: Diagnosis present

## 2020-12-15 DIAGNOSIS — I255 Ischemic cardiomyopathy: Secondary | ICD-10-CM | POA: Diagnosis present

## 2020-12-15 DIAGNOSIS — I493 Ventricular premature depolarization: Secondary | ICD-10-CM | POA: Diagnosis present

## 2020-12-15 DIAGNOSIS — Z95810 Presence of automatic (implantable) cardiac defibrillator: Secondary | ICD-10-CM | POA: Diagnosis not present

## 2020-12-15 DIAGNOSIS — D696 Thrombocytopenia, unspecified: Secondary | ICD-10-CM | POA: Diagnosis present

## 2020-12-15 DIAGNOSIS — R55 Syncope and collapse: Secondary | ICD-10-CM | POA: Diagnosis present

## 2020-12-15 DIAGNOSIS — I6523 Occlusion and stenosis of bilateral carotid arteries: Secondary | ICD-10-CM | POA: Diagnosis not present

## 2020-12-15 LAB — CBC
HCT: 44.8 % (ref 36.0–46.0)
Hemoglobin: 13.9 g/dL (ref 12.0–15.0)
MCH: 29.3 pg (ref 26.0–34.0)
MCHC: 31 g/dL (ref 30.0–36.0)
MCV: 94.5 fL (ref 80.0–100.0)
Platelets: 97 10*3/uL — ABNORMAL LOW (ref 150–400)
RBC: 4.74 MIL/uL (ref 3.87–5.11)
RDW: 12.8 % (ref 11.5–15.5)
WBC: 8.6 10*3/uL (ref 4.0–10.5)
nRBC: 0 % (ref 0.0–0.2)

## 2020-12-15 LAB — VITAMIN B12: Vitamin B-12: 270 pg/mL (ref 180–914)

## 2020-12-15 LAB — COMPREHENSIVE METABOLIC PANEL
ALT: 18 U/L (ref 0–44)
AST: 16 U/L (ref 15–41)
Albumin: 3.3 g/dL — ABNORMAL LOW (ref 3.5–5.0)
Alkaline Phosphatase: 76 U/L (ref 38–126)
Anion gap: 8 (ref 5–15)
BUN: 12 mg/dL (ref 6–20)
CO2: 28 mmol/L (ref 22–32)
Calcium: 8.6 mg/dL — ABNORMAL LOW (ref 8.9–10.3)
Chloride: 98 mmol/L (ref 98–111)
Creatinine, Ser: 0.75 mg/dL (ref 0.44–1.00)
GFR, Estimated: >60 mL/min (ref >60)
Glucose, Bld: 97 mg/dL (ref 70–99)
Potassium: 3.8 mmol/L (ref 3.5–5.1)
Sodium: 134 mmol/L — ABNORMAL LOW (ref 135–145)
Total Bilirubin: 0.5 mg/dL (ref 0.3–1.2)
Total Protein: 6.9 g/dL (ref 6.5–8.1)

## 2020-12-15 LAB — CBG MONITORING, ED
Glucose-Capillary: 85 mg/dL (ref 70–99)
Glucose-Capillary: 85 mg/dL (ref 70–99)
Glucose-Capillary: 122 mg/dL — ABNORMAL HIGH (ref 70–99)
Glucose-Capillary: 98 mg/dL (ref 70–99)

## 2020-12-15 LAB — RAPID URINE DRUG SCREEN, HOSP PERFORMED
Amphetamines: NOT DETECTED
Barbiturates: NOT DETECTED
Benzodiazepines: NOT DETECTED
Cocaine: NOT DETECTED
Opiates: NOT DETECTED
Tetrahydrocannabinol: NOT DETECTED

## 2020-12-15 LAB — RESP PANEL BY RT-PCR (FLU A&B, COVID) ARPGX2
Influenza A by PCR: NEGATIVE
Influenza B by PCR: NEGATIVE
SARS Coronavirus 2 by RT PCR: NEGATIVE

## 2020-12-15 LAB — HEMOGLOBIN A1C
Hgb A1c MFr Bld: 5.9 % — ABNORMAL HIGH (ref 4.8–5.6)
Mean Plasma Glucose: 122.63 mg/dL

## 2020-12-15 LAB — PROTIME-INR
INR: 2.1 — ABNORMAL HIGH (ref 0.8–1.2)
Prothrombin Time: 22.7 seconds — ABNORMAL HIGH (ref 11.4–15.2)

## 2020-12-15 LAB — GLUCOSE, CAPILLARY: Glucose-Capillary: 109 mg/dL — ABNORMAL HIGH (ref 70–99)

## 2020-12-15 LAB — FOLATE: Folate: 19.8 ng/mL (ref >5.9)

## 2020-12-15 LAB — TROPONIN I (HIGH SENSITIVITY)
Troponin I (High Sensitivity): 18 ng/L — ABNORMAL HIGH (ref <18)
Troponin I (High Sensitivity): 13 ng/L (ref <18)

## 2020-12-15 LAB — PHOSPHORUS: Phosphorus: 3.9 mg/dL (ref 2.5–4.6)

## 2020-12-15 LAB — MAGNESIUM: Magnesium: 2.3 mg/dL (ref 1.7–2.4)

## 2020-12-15 LAB — T4, FREE: Free T4: 1.06 ng/dL (ref 0.61–1.12)

## 2020-12-15 LAB — DIGOXIN LEVEL: Digoxin Level: 1.3 ng/mL (ref 0.8–2.0)

## 2020-12-15 LAB — TSH: TSH: 0.665 u[IU]/mL (ref 0.350–4.500)

## 2020-12-15 LAB — FIBRINOGEN: Fibrinogen: 581 mg/dL — ABNORMAL HIGH (ref 210–475)

## 2020-12-15 LAB — APTT: aPTT: 50 seconds — ABNORMAL HIGH (ref 24–36)

## 2020-12-15 MED ORDER — SODIUM CHLORIDE 0.9 % IV BOLUS
250.0000 mL | Freq: Once | INTRAVENOUS | Status: AC
  Administered 2020-12-15: 250 mL via INTRAVENOUS

## 2020-12-15 MED ORDER — CARVEDILOL 6.25 MG PO TABS
6.2500 mg | ORAL_TABLET | Freq: Two times a day (BID) | ORAL | Status: DC
  Administered 2020-12-15 – 2020-12-19 (×6): 6.25 mg via ORAL
  Filled 2020-12-15 (×5): qty 1
  Filled 2020-12-15: qty 2
  Filled 2020-12-15 (×2): qty 1

## 2020-12-15 MED ORDER — GABAPENTIN 300 MG PO CAPS
600.0000 mg | ORAL_CAPSULE | Freq: Every day | ORAL | Status: DC
  Administered 2020-12-15 – 2020-12-18 (×4): 600 mg via ORAL
  Filled 2020-12-15 (×4): qty 2

## 2020-12-15 MED ORDER — DIGOXIN 125 MCG PO TABS
0.1250 mg | ORAL_TABLET | Freq: Every day | ORAL | Status: DC
  Administered 2020-12-15 – 2020-12-18 (×4): 0.125 mg via ORAL
  Filled 2020-12-15 (×7): qty 1

## 2020-12-15 MED ORDER — POTASSIUM CHLORIDE CRYS ER 20 MEQ PO TBCR
20.0000 meq | EXTENDED_RELEASE_TABLET | Freq: Once | ORAL | Status: AC
  Administered 2020-12-15: 20 meq via ORAL
  Filled 2020-12-15: qty 1

## 2020-12-15 MED ORDER — AMIODARONE HCL IN DEXTROSE 360-4.14 MG/200ML-% IV SOLN
30.0000 mg/h | INTRAVENOUS | Status: DC
  Administered 2020-12-15 – 2020-12-18 (×6): 30 mg/h via INTRAVENOUS
  Filled 2020-12-15 (×7): qty 200

## 2020-12-15 MED ORDER — FAMOTIDINE 20 MG PO TABS
20.0000 mg | ORAL_TABLET | Freq: Every day | ORAL | Status: DC
  Administered 2020-12-15 – 2020-12-19 (×5): 20 mg via ORAL
  Filled 2020-12-15 (×5): qty 1

## 2020-12-15 MED ORDER — CARVEDILOL 12.5 MG PO TABS
12.5000 mg | ORAL_TABLET | Freq: Two times a day (BID) | ORAL | Status: DC
  Administered 2020-12-15: 12.5 mg via ORAL
  Filled 2020-12-15: qty 1

## 2020-12-15 MED ORDER — OXYCODONE-ACETAMINOPHEN 5-325 MG PO TABS
1.0000 | ORAL_TABLET | Freq: Four times a day (QID) | ORAL | Status: DC | PRN
  Administered 2020-12-15 (×3): 1 via ORAL
  Filled 2020-12-15 (×3): qty 1

## 2020-12-15 MED ORDER — DULOXETINE HCL 30 MG PO CPEP
30.0000 mg | ORAL_CAPSULE | Freq: Every day | ORAL | Status: DC
  Administered 2020-12-15 – 2020-12-19 (×5): 30 mg via ORAL
  Filled 2020-12-15 (×5): qty 1

## 2020-12-15 MED ORDER — AMIODARONE LOAD VIA INFUSION
150.0000 mg | Freq: Once | INTRAVENOUS | Status: AC
  Administered 2020-12-15: 150 mg via INTRAVENOUS
  Filled 2020-12-15: qty 83.34

## 2020-12-15 MED ORDER — SPIRONOLACTONE 25 MG PO TABS
25.0000 mg | ORAL_TABLET | Freq: Every day | ORAL | Status: DC
  Administered 2020-12-15: 25 mg via ORAL
  Filled 2020-12-15 (×5): qty 1

## 2020-12-15 MED ORDER — POTASSIUM CHLORIDE CRYS ER 20 MEQ PO TBCR
20.0000 meq | EXTENDED_RELEASE_TABLET | Freq: Every day | ORAL | Status: DC
  Administered 2020-12-15 – 2020-12-19 (×5): 20 meq via ORAL
  Filled 2020-12-15 (×5): qty 1

## 2020-12-15 MED ORDER — GABAPENTIN 300 MG PO CAPS
300.0000 mg | ORAL_CAPSULE | Freq: Every day | ORAL | Status: DC
  Administered 2020-12-15 – 2020-12-19 (×5): 300 mg via ORAL
  Filled 2020-12-15 (×5): qty 1

## 2020-12-15 MED ORDER — INSULIN ASPART 100 UNIT/ML ~~LOC~~ SOLN: 0.0000 [IU] | Freq: Every day | SUBCUTANEOUS | Status: DC

## 2020-12-15 MED ORDER — SODIUM CHLORIDE 0.9 % IV BOLUS: 250.0000 mL | Freq: Once | INTRAVENOUS | Status: DC

## 2020-12-15 MED ORDER — ACETAMINOPHEN 325 MG PO TABS: 650.0000 mg | ORAL_TABLET | Freq: Four times a day (QID) | ORAL | Status: DC | PRN

## 2020-12-15 MED ORDER — EZETIMIBE 10 MG PO TABS
10.0000 mg | ORAL_TABLET | Freq: Every day | ORAL | Status: DC
  Administered 2020-12-15 – 2020-12-19 (×5): 10 mg via ORAL
  Filled 2020-12-15 (×8): qty 1

## 2020-12-15 MED ORDER — TORSEMIDE 20 MG PO TABS
80.0000 mg | ORAL_TABLET | Freq: Every day | ORAL | Status: DC
  Administered 2020-12-15: 80 mg via ORAL
  Filled 2020-12-15 (×4): qty 4

## 2020-12-15 MED ORDER — WARFARIN SODIUM 5 MG PO TABS: 5.0000 mg | ORAL_TABLET | Freq: Once | ORAL | Status: DC

## 2020-12-15 MED ORDER — INSULIN ASPART 100 UNIT/ML ~~LOC~~ SOLN
0.0000 [IU] | Freq: Three times a day (TID) | SUBCUTANEOUS | Status: DC
  Administered 2020-12-16: 2 [IU] via SUBCUTANEOUS
  Administered 2020-12-17: 1 [IU] via SUBCUTANEOUS
  Administered 2020-12-17: 2 [IU] via SUBCUTANEOUS
  Administered 2020-12-19: 1 [IU] via SUBCUTANEOUS

## 2020-12-15 MED ORDER — AMIODARONE HCL IN DEXTROSE 360-4.14 MG/200ML-% IV SOLN
60.0000 mg/h | INTRAVENOUS | Status: DC
  Administered 2020-12-15 (×2): 60 mg/h via INTRAVENOUS
  Filled 2020-12-15: qty 200

## 2020-12-15 MED ORDER — WARFARIN - PHARMACIST DOSING INPATIENT: Freq: Every day | Status: DC

## 2020-12-15 MED ORDER — WARFARIN SODIUM 5 MG PO TABS: 10.0000 mg | ORAL_TABLET | ORAL | Status: DC

## 2020-12-15 MED ORDER — ASPIRIN EC 81 MG PO TBEC
81.0000 mg | DELAYED_RELEASE_TABLET | Freq: Every day | ORAL | Status: DC
  Administered 2020-12-15 – 2020-12-19 (×5): 81 mg via ORAL
  Filled 2020-12-15 (×5): qty 1

## 2020-12-15 MED ORDER — WARFARIN SODIUM 5 MG PO TABS: 5.0000 mg | ORAL_TABLET | ORAL | Status: DC

## 2020-12-15 MED ORDER — SACUBITRIL-VALSARTAN 24-26 MG PO TABS
1.0000 | ORAL_TABLET | Freq: Two times a day (BID) | ORAL | Status: DC
  Administered 2020-12-15 (×2): 1 via ORAL
  Filled 2020-12-15 (×9): qty 1

## 2020-12-15 MED ORDER — DULOXETINE HCL 60 MG PO CPEP
60.0000 mg | ORAL_CAPSULE | Freq: Every evening | ORAL | Status: DC
  Administered 2020-12-15 – 2020-12-18 (×4): 60 mg via ORAL
  Filled 2020-12-15 (×2): qty 1
  Filled 2020-12-15: qty 2
  Filled 2020-12-15: qty 1

## 2020-12-15 MED ORDER — ADULT MULTIVITAMIN W/MINERALS CH
1.0000 | ORAL_TABLET | Freq: Every day | ORAL | Status: DC
  Administered 2020-12-16 – 2020-12-19 (×4): 1 via ORAL
  Filled 2020-12-15 (×4): qty 1

## 2020-12-15 MED ORDER — WARFARIN - PHYSICIAN DOSING INPATIENT: Freq: Every day | Status: DC

## 2020-12-15 NOTE — Progress Notes (Signed)
Pt. Arrived to unit via [facility]. Pt. Alert and stable. On call for Cardiology paged to make aware of pts. Arrival to unit.

## 2020-12-15 NOTE — Progress Notes (Signed)
Progress Note  Patient Name: [name] [name] Date of Encounter: 12/16/2020  [facility] Cardiologist: Nona Dell, MD   Subjective   Patient is concerned that she was told her ICD fired at 4pm as she was driving and asymptomatic at that time. States she passed out at 5pm.  Stable overnight. No chest pain, lightheadedness or dizziness. States that she has never felt her shocks before and has no prior history of syncope prior to this event.  Tele with frequent PVCs, rare NSVT, no VT   Inpatient Medications    Scheduled Meds: . aspirin EC  81 mg Oral Daily  . carvedilol  6.25 mg Oral BID WC  . digoxin  0.125 mg Oral Daily  . DULoxetine  30 mg Oral Daily  . DULoxetine  60 mg Oral QPM  . ezetimibe  10 mg Oral Daily  . famotidine  20 mg Oral Daily  . gabapentin  300 mg Oral Daily  . gabapentin  600 mg Oral QHS  . insulin aspart  0-5 Units Subcutaneous QHS  . insulin aspart  0-9 Units Subcutaneous TID WC  . multivitamin with minerals  1 tablet Oral Daily  . potassium chloride SA  20 mEq Oral Daily   Continuous Infusions: . amiodarone 30 mg/hr (12/16/20 0417)  . heparin     PRN Meds: acetaminophen, oxyCODONE-acetaminophen   Vital Signs    Vitals:   12/16/20 0044 12/16/20 0046 12/16/20 0512 12/16/20 0802  BP: (!) 113/54  122/62 104/87  Pulse: (!) 59  68 69  Resp: 16  18 18   Temp: 97.7 F (36.5 C)  97.8 F (36.6 C) 97.8 F (36.6 C)  TempSrc: Oral  Oral Oral  SpO2: 94%  100% 95%  Weight:  93.8 kg      Intake/Output Summary (Last 24 hours) at 12/16/2020 0929 Last data filed at 12/16/2020 0500 Gross per 24 hour  Intake 1392.34 ml  Output 1100 ml  Net 292.34 ml   Last 3 Weights 12/16/2020 12/15/2020 12/01/2020  Weight (lbs) 206 lb 12.7 oz 205 lb 4.8 oz 208 lb  Weight (kg) 93.8 kg 93.123 kg 94.348 kg      Telemetry    NSR with intermittent a-pacing, very frequent PVCs, rare NSVT - Personally Reviewed  ECG    NSR with intermittent A-pacing, IVCD,  PVCs - Personally Reviewed  Physical Exam   GEN: No acute distress.   Neck: No JVD Cardiac: RRR, no murmurs, rubs, or gallops.  Respiratory: Clear to auscultation bilaterally. GI: Soft, nontender, non-distended  MS: No edema; No deformity. Neuro:  Nonfocal  Psych: Normal affect   Labs    High Sensitivity Troponin:   Recent Labs  Lab 12/14/20 1921 12/15/20 0112 12/15/20 1057  TROPONINIHS 15 18* 13      Chemistry Recent Labs  Lab 12/14/20 1921 12/15/20 0354 12/16/20 0647  NA 133* 134* 139  K 3.7 3.8 3.5  CL 97* 98 102  CO2 28 28 27   GLUCOSE 115* 97 93  BUN 13 12 8   CREATININE 0.85 0.75 0.79  CALCIUM 8.4* 8.6* 8.7*  PROT  --  6.9  --   ALBUMIN  --  3.3*  --   AST  --  16  --   ALT  --  18  --   ALKPHOS  --  76  --   BILITOT  --  0.5  --   GFRNONAA >60 >60 >60  ANIONGAP 8 8 10      Hematology Recent Labs  Lab 12/14/20 1921 12/15/20 0354 12/16/20 0647  WBC 8.5 8.6 8.1  RBC 4.56 4.74 4.57  HGB 13.8 13.9 13.4  HCT 42.4 44.8 41.9  MCV 93.0 94.5 91.7  MCH 30.3 29.3 29.3  MCHC 32.5 31.0 32.0  RDW 13.0 12.8 13.1  PLT 97* 97* 96*    BNP Recent Labs  Lab 12/14/20 1921  BNP 209.0*     DDimer No results for input(s): DDIMER in the last 168 hours.   Radiology    US Carotid Bilateral  Result Date: 12/15/2020 CLINICAL DATA:  Right carotid artery occlusion. EXAM: BILATERAL CAROTID DUPLEX ULTRASOUND TECHNIQUE: Wallace Cullens scale imaging, color Doppler and duplex ultrasound were performed of bilateral carotid and vertebral arteries in the neck. COMPARISON:  10/07/2014 and CT a 07/28/2017 FINDINGS: Criteria: Quantification of carotid stenosis is based on velocity parameters that correlate the residual internal carotid diameter with NASCET-based stenosis levels, using the diameter of the distal internal carotid lumen as the denominator for stenosis measurement. The following velocity measurements were obtained: RIGHT ICA: Occluded CCA: 67/7 cm/sec ECA: 290 cm/sec LEFT  ICA: 128/25 cm/sec CCA: 93/19 cm/sec SYSTOLIC ICA/CCA RATIO:  0.4 ECA: 198 cm/sec RIGHT CAROTID ARTERY: Echogenic plaque at the right carotid bulb. External carotid artery is patent with normal waveform. Chronic occlusion of the proximal internal carotid artery. RIGHT VERTEBRAL ARTERY: Antegrade flow and normal waveform in the right vertebral artery. LEFT CAROTID ARTERY: Echogenic plaque at the left carotid bulb. External carotid artery is patent with normal waveform. Small amount of plaque in the proximal internal carotid artery. Normal waveforms in the internal carotid artery. LEFT VERTEBRAL ARTERY: Antegrade flow and normal waveform in the left vertebral artery. IMPRESSION: 1. 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 2. Small amount of plaque in the left internal carotid artery. Peak systolic velocity in the left internal artery has slightly elevated, now measuring up to 128 cm/sec. Estimated degree of stenosis in the left internal carotid artery is less than 50% based on the carotid artery ratio but recommend continued surveillance. 3. Patent vertebral arteries with antegrade flow. 4. Bilateral external carotid arteries are patent. Again noted is an elevated peak systolic velocity in the right external carotid artery. Electronically Signed   By: Richarda Overlie M.D.   On: 12/15/2020 12:50   DG Chest Portable 1 View  Result Date: 12/14/2020 CLINICAL DATA:  Syncope syncope EXAM: PORTABLE CHEST 1 VIEW COMPARISON:  08/11/2020 FINDINGS: Left AICD remains in place, unchanged. Cardiomegaly. Linear areas of scarring in the mid lungs bilaterally. No confluent opacities, effusions or edema. IMPRESSION: Cardiomegaly.  Chronic changes.  No active disease. Electronically Signed   By: Charlett Nose M.D.   On: 12/14/2020 23:32    Cardiac Studies   TEE 12/01/20 1. Left ventricular ejection fraction, by estimation, is 25 to 30%. The  left ventricle has severely decreased function. The left ventricle   demonstrates regional wall motion abnormalities with severe hypokinesis to  akinesis of the anterior and septal  walls. The left ventricular internal cavity size was mildly dilated.  2. Right ventricular systolic function is mildly reduced. The right  ventricular size is normal.  3. Left atrial size was moderately dilated. No left atrial/left atrial  appendage thrombus was detected.  4. Right atrial size was mildly dilated.  5. There was moderate mitral regurgitation with ERO 0.26 cm^2 by PISA  (suspect functional MR). No evidence of mitral stenosis.  6. No PFO/ASD by color doppler.  7. The aortic valve is tricuspid. Aortic valve  regurgitation is not  visualized. No aortic stenosis is present.  8. Peak RV-RA gradient 18 mmHg.  9. Normal caliber thoracic aorta with no significant plaque.   Patient Profile     59 y.o. female with history of antiphospholipid syndrome on coumadin and ASA, CVA, CAD/ICMMI in 2008 in [facility] cath at the time showed totally occluded diagonal, totally occluded LCX, and diffuse LAD disease, afib with recent TEE/DCCV 11/2020, brain aneurysm,PAD, chronic systolic HF LVEF 16-10% with ICD who presented with syncope with device interrogation revealing VT with initial ATP (unsuccessful) followed by single shock now back to NSR  Assessment & Plan    #Syncope due to ventricular tachycardia s/p ICD shock (with prior hx of sustained VT) #Frequent PVCs Likely secondary to underlying chronic systolic heart failure with lower suspicion of new ischemia triggering event as ECG without ischemia and trops only mildly elevated. Unable to up-titrate BB due to relatively soft blood pressures. Started on amiodarone gtt. No further VT overnight but with continued frequent PVCs -EP to see Monday unless further episodes of VT -Continue amiodarone gtt--would not wean off until EP evaluation -Start heparin gtt no bolus given antiphospholipid AB syndrome -Keep K>4, Mg>2; will need to  trend these daily and replete aggressively  # Hypotension  Continues to have soft blood pressures. Asymptomatic and otherwise stable. -Hold entresto, spiro and torsemide for now -Continue coreg -Will add back GDMT as tolerated pending continued clinical stability  #Chronic combined CHF Appears euvolemic. With episode of VT and syncope s/p ICD shock x1 as above.  -Holding entresto, spiro and torsemide for now given hypotension -Continue coreg and digoxin -Will touch base with  HF team regarding LHC/RHC  #CAD Low suspicion of acute ischemia as trigger for VT given ECG without ischemic changes and trop mildly elevated but flat. No antecedent ischemic symptoms. - Continue ASA - Does not tolerate statins; may need PSCK-9 inhibitor as out-patient due to severe CAD on cath in 2008 -Possible plan for RHC/LHC  # Persistent afib s/p recent DCCV 12/01/20 (also hx of CVA, anticardiolipin ab positive) -Start heparin gtt as above as INR<2 -Continue coreg -Remains in sinus currently  # Antiphospholipid AB syndrome: -Holding warfarin for now given possible plans for RHC/LHC -Start heparin gtt as above -Continue ASA 81mg  daily   For questions or updates, please contact [facility] Please consult [email or web address] for contact info under        Signed, , MD  12/16/2020, 9:29 AM

## 2020-12-15 NOTE — Progress Notes (Addendum)
Pt status reviewed with UR nurse - meets inpt criteria rather than obs. We do not want to change order at this moment because it will supercede her transfer orders, but UR team plans to change over when she gets to [facility] per our verbal discussion. Rounding team should review in AM to make sure this was done, and also make sure attending is updated to reflect our general cardiology team.  Addendum: called [facility] for update - patient does not yet have a bed at [facility] but they will transfer when assigned. Also spoke with Trish who had already contacted bed control. I sent a message to the cardmaster inbox for handoff to our AM team so they are aware to involve EP in AM if not seen tonight.

## 2020-12-15 NOTE — ED Notes (Signed)
Pt stated she has not been diagnosed with DM and she is not taking any insulin.

## 2020-12-15 NOTE — ED Notes (Signed)
Pt talking to PA at bedside while getting carotid US

## 2020-12-15 NOTE — ED Notes (Signed)
Verbal order for stat 250 ml of NS for hypotension by PA Dunn

## 2020-12-15 NOTE — ED Notes (Signed)
Pt understands she is on bedrest and will accept purewick but would like a bedside commode once off of bedrest.

## 2020-12-15 NOTE — Progress Notes (Signed)
Transfer orders done - stepdown bed per d/w Dr. Wyline Mood. Called Trish (cardmaster), [facility], spoke with EP APP at [facility], notified IM and gave update to nurse. Omarii Scalzo PA-C

## 2020-12-15 NOTE — ED Notes (Signed)
Waiting on medication from AC 

## 2020-12-15 NOTE — ED Notes (Signed)
PA Dunn gave [employer] paper work back to nursing. Stated make sure paper stays with pt wherever she moves to. Papers placed in a envelope.

## 2020-12-15 NOTE — Progress Notes (Signed)
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  The patient states that she has been in her usual state of health without any new complaints.  Specifically, the patient denies any recent fevers, chills, chest pain, worsening shortness of breath, palpitations, coughing, hemoptysis, nausea, vomiting, diarrhea, abdominal pain, dysuria, hematuria.  In fact, the patient states that she has had more stamina since her cardioversion on 12/01/2020.  The patient had recently seen Dr. [name] on 11/28/2020 at which time her spironolactone was increased to 25 mg daily and [name] was added for her CHF.  She was continued on her current dose of carvedilol and Entresto.  The patient denies any other new medication changes.  She states that she has been eating as good as usual, but states that she has been trying to lose weight.  She has lost 14 pounds over the last 3 months. While she was sitting on the couch talking to her friend, the patient began feeling some dizziness and a sensation of "shivering and creepy crawly's" all over her immediately after which she slumped backward.  Her friend who witnessed the episode stated that she had her eyes open the entire time, but was not responsive.  There was no postictal type symptoms.  There was no tongue bite or urinary or bowel incontinence.  The patient states that she has been doing her usual daily activities without any worsening shortness of breath or chest discomfort or palpitations this past week. In the emergency department, there was some difficulty getting her AICD interrogated.  The patient was  afebrile hemodynamically stable with oxygen saturation 98% on room air.  BMP was essentially unremarkable except for sodium 134.  LFTs were unremarkable.  WBC 8.6, hemoglobin 13.8, platelets 97,000.  Assessment/Plan: Syncope -Etiology unclear -Check orthostatic vital signs -Obtain EEG -Given the patient's history of chronic right ICA occlusion, obtain carotid ultrasound -Cardiology consult -Remain on telemetry  Thrombocytopenia -New since July 2020 -Serum B12 -Folic acid -TSH -May ultimately need outpatient hematology evaluation -Suspect this is medication related  Chronic systolic CHF -12/01/2020 TEE EF 25 to 30%, severe HK anterior and septal walls, mild decreased RV function, moderate MR -Appears clinically euvolemic -Continue carvedilol, Entresto, spironolactone -Continue home dose torsemide 80 mg daily  Persistent atrial fibrillation -Status post cardioversion 12/01/2020 -Continue warfarin -Currently in sinus rhythm  Coronary artery disease -No chest pain presently -Continue aspirin and warfarin  Right carotid occlusion -This has been chronic, originally noted 10/07/2014  Essential hypertension -Continue carvedilol  Tobacco abuse -Patient has approximately 40-pack-year history -Tobacco cessation discussed  Hyperlipidemia -Patient has been statin intolerant -Continue Zetia for now -plans for possible outpt Repatha  Anticardiolipin antibody -Continue warfarin  Hyponatremia -This has been intermittent -Likely related to her fluid status        Status is: Observation  The patient remains OBS appropriate and will d/c before 2 midnights.  Dispo: The patient is from: Home              Anticipated d/c is to: Home              Anticipated d/c date is: 1 day  Patient currently is not medically stable to d/c.        Family Communication:   No Family at bedside  Consultants:  cardiology  Code Status:  FULL   DVT Prophylaxis:   warfarin   Procedures: As Listed in Progress Note Above  Antibiotics: None       Subjective: Patient denies fevers, chills, headache, chest pain, dyspnea, nausea, vomiting, diarrhea, abdominal pain, dysuria, hematuria, hematochezia, and melena.   Objective: Vitals:   12/15/20 0000 12/15/20 0029 12/15/20 0320 12/15/20 0330  BP: (!) 121/57  112/73 121/62  Pulse: 68  71 64  Resp: (!) 24  17 14   Temp:      TempSrc:      SpO2: 98% 98% 98% 95%   No intake or output data in the 24 hours ending 12/15/20 0728 Weight change:  Exam:   General:  Pt is alert, follows commands appropriately, not in acute distress  HEENT: No icterus, No thrush, No neck mass, [place]/AT  Cardiovascular: RRR, S1/S2, no rubs, no gallops  Respiratory: bibasilar rales.  No wheeze  Abdomen: Soft/+BS, non tender, non distended, no guarding Extremities: No edema, No lymphangitis, No petechiae, No rashes, no synovitis Neuro:  CN II-XII intact, strength 4/5 in RUE, RLE, strength 4/5 LUE, LLE; sensation intact bilateral; no dysmetria; babinski equivocal   Data Reviewed: I have personally reviewed following labs and imaging studies Basic Metabolic Panel: Recent Labs  Lab 12/14/20 1921 12/15/20 0354  NA 133* 134*  K 3.7 3.8  CL 97* 98  CO2 28 28  GLUCOSE 115* 97  BUN 13 12  CREATININE 0.85 0.75  CALCIUM 8.4* 8.6*  MG  --  2.3  PHOS  --  3.9   Liver Function Tests: Recent Labs  Lab 12/15/20 0354  AST 16  ALT 18  ALKPHOS 76  BILITOT 0.5  PROT 6.9  ALBUMIN 3.3*   No results for input(s): LIPASE, AMYLASE in the last 168 hours. No results for input(s): AMMONIA in the last 168 hours. Coagulation Profile: Recent Labs  Lab 12/13/20 1606 12/15/20 0354  INR 2.7 2.1*   CBC: Recent Labs  Lab 12/14/20 1921 12/15/20 0354  WBC 8.5 8.6  HGB 13.8 13.9  HCT 42.4 44.8  MCV 93.0 94.5  PLT 97* 97*   Cardiac Enzymes: No results for input(s): CKTOTAL, CKMB, CKMBINDEX, TROPONINI in the last 168  hours. BNP: Invalid input(s): POCBNP CBG: Recent Labs  Lab 12/14/20 2002 12/15/20 0237  GLUCAP 101* 85   HbA1C: No results for input(s): HGBA1C in the last 72 hours. Urine analysis:    Component Value Date/Time   COLORURINE YELLOW 12/14/2020 1829   APPEARANCEUR CLEAR 12/14/2020 1829   LABSPEC 1.009 12/14/2020 1829   PHURINE 5.0 12/14/2020 1829   GLUCOSEU 50 (A) 12/14/2020 1829   HGBUR SMALL (A) 12/14/2020 1829   BILIRUBINUR NEGATIVE 12/14/2020 1829   KETONESUR NEGATIVE 12/14/2020 1829   PROTEINUR NEGATIVE 12/14/2020 1829   UROBILINOGEN 0.2 12/23/2011 1440   NITRITE NEGATIVE 12/14/2020 1829   LEUKOCYTESUR NEGATIVE 12/14/2020 1829   Sepsis Labs: @LABRCNTIP (procalcitonin:4,lacticidven:4) ) Recent Results (from the past 240 hour(s))  Resp Panel by RT-PCR (Flu A&B, Covid) Nasopharyngeal Swab     Status: None   Collection Time: 12/14/20 11:53 PM   Specimen: Nasopharyngeal Swab; Nasopharyngeal(NP) swabs in vial transport medium  Result Value Ref Range Status   SARS Coronavirus 2 by RT PCR NEGATIVE NEGATIVE Final    Comment: (NOTE) SARS-CoV-2 target nucleic acids are NOT DETECTED.  The SARS-CoV-2  RNA is generally detectable in upper respiratory specimens during the acute phase of infection. The lowest concentration of SARS-CoV-2 viral copies this assay can detect is 138 copies/mL. A negative result does not preclude SARS-Cov-2 infection and should not be used as the sole basis for treatment or other patient management decisions. A negative result may occur with  improper specimen collection/handling, submission of specimen other than nasopharyngeal swab, presence of viral mutation(s) within the areas targeted by this assay, and inadequate number of viral copies(<138 copies/mL). A negative result must be combined with clinical observations, patient history, and epidemiological information. The expected result is Negative.  Fact Sheet for Patients:   BloggerCourse.com  Fact Sheet for Healthcare Providers:  SeriousBroker.it  This test is no t yet approved or cleared by the [place] FDA and  has been authorized for detection and/or diagnosis of SARS-CoV-2 by FDA under an Emergency Use Authorization (EUA). This EUA will remain  in effect (meaning this test can be used) for the duration of the COVID-19 declaration under Section 564(b)(1) of the Act, 21 U.S.C.section 360bbb-3(b)(1), unless the authorization is terminated  or revoked sooner.       Influenza A by PCR NEGATIVE NEGATIVE Final   Influenza B by PCR NEGATIVE NEGATIVE Final    Comment: (NOTE) The Xpert Xpress SARS-CoV-2/FLU/RSV plus assay is intended as an aid in the diagnosis of influenza from Nasopharyngeal swab specimens and should not be used as a sole basis for treatment. Nasal washings and aspirates are unacceptable for Xpert Xpress SARS-CoV-2/FLU/RSV testing.  Fact Sheet for Patients: BloggerCourse.com  Fact Sheet for Healthcare Providers: SeriousBroker.it  This test is not yet approved or cleared by the [place] FDA and has been authorized for detection and/or diagnosis of SARS-CoV-2 by FDA under an Emergency Use Authorization (EUA). This EUA will remain in effect (meaning this test can be used) for the duration of the COVID-19 declaration under Section 564(b)(1) of the Act, 21 U.S.C. section 360bbb-3(b)(1), unless the authorization is terminated or revoked.  Performed at [facility], [address] [place], [place] [address]      Scheduled Meds: . aspirin EC  81 mg Oral Daily  . carvedilol  12.5 mg Oral BID WC  . digoxin  0.125 mg Oral Daily  . ezetimibe  10 mg Oral Daily  . famotidine  20 mg Oral Daily  . insulin aspart  0-5 Units Subcutaneous QHS  . insulin aspart  0-9 Units Subcutaneous TID WC  . potassium chloride SA  20 mEq Oral Daily   . sacubitril-valsartan  1 tablet Oral BID  . spironolactone  25 mg Oral QHS  . torsemide  80 mg Oral Daily  . [START ON 12/17/2020] warfarin  10 mg Oral Once per day on Sun Tue Thu  . warfarin  5 mg Oral Once per day on Wed Fri Sat  . Warfarin - Physician Dosing Inpatient   Does not apply q1600   Continuous Infusions:  Procedures/Studies: DG Chest Portable 1 View  Result Date: 12/14/2020 CLINICAL DATA:  Syncope syncope EXAM: PORTABLE CHEST 1 VIEW COMPARISON:  08/11/2020 FINDINGS: Left AICD remains in place, unchanged. Cardiomegaly. Linear areas of scarring in the mid lungs bilaterally. No confluent opacities, effusions or edema. IMPRESSION: Cardiomegaly.  Chronic changes.  No active disease. Electronically Signed   By: Charlett Nose M.D.   On: 12/14/2020 23:32   ECHO TEE  Result Date: 12/07/2020    TRANSESOPHOGEAL ECHO REPORT   Patient Name:   DARRAH DREDGE Date  of Exam: 12/01/2020 Medical Rec #:  [id number]      Height:       65.5 in Accession #:    [id number]     Weight:       208.0 lb Date of Birth:  Jun 05, 1961      BSA:          2.023 m Patient Age:    59 years       BP:           110/79 mmHg Patient Gender: F              HR:           61 bpm. Exam Location:  Inpatient Procedure: Transesophageal Echo, Cardiac Doppler and Color Doppler Indications:     Atrial Fibrillation  History:         Patient has prior history of Echocardiogram examinations, most                  recent 08/11/2020. Cardiomyopathy, CAD, Stroke; Risk                  Factors:Hypertension, Dyslipidemia and Current Smoker. PVD.  Sonographer:     Renella Cunas RDCS Referring Phys:  [id number] [facility] Diagnosing Phys: [name] MD PROCEDURE: The transesophogeal probe was passed without difficulty through the esophogus of the patient. Sedation performed by different physician. The patient was monitored while under deep sedation. Anesthestetic sedation was provided intravenously by Anesthesiology: 407.  of Propofol,  of  Lidocaine. The patient developed no complications during the procedure. IMPRESSIONS  1. Left ventricular ejection fraction, by estimation, is 25 to 30%. The left ventricle has severely decreased function. The left ventricle demonstrates regional wall motion abnormalities with severe hypokinesis to akinesis of the anterior and septal walls. The left ventricular internal cavity size was mildly dilated.  2. Right ventricular systolic function is mildly reduced. The right ventricular size is normal.  3. Left atrial size was moderately dilated. No left atrial/left atrial appendage thrombus was detected.  4. Right atrial size was mildly dilated.  5. There was moderate mitral regurgitation with ERO 0.26 cm^2 by PISA (suspect functional MR). No evidence of mitral stenosis.  6. No PFO/ASD by color doppler.  7. The aortic valve is tricuspid. Aortic valve regurgitation is not visualized. No aortic stenosis is present.  8. Peak RV-RA gradient 18 mmHg.  9. Normal caliber thoracic aorta with no significant plaque. FINDINGS  Left Ventricle: Left ventricular ejection fraction, by estimation, is 25 to 30%. The left ventricle has severely decreased function. The left ventricle demonstrates regional wall motion abnormalities. The left ventricular internal cavity size was mildly  dilated. There is no left ventricular hypertrophy. Right Ventricle: The right ventricular size is normal. No increase in right ventricular wall thickness. Right ventricular systolic function is mildly reduced. Left Atrium: Left atrial size was moderately dilated. No left atrial/left atrial appendage thrombus was detected. Right Atrium: Right atrial size was mildly dilated. Pericardium: There is no evidence of pericardial effusion. Mitral Valve: There was moderate mitral regurgitation with ERO 0.26 cm^2 by PISA (suspect functional MR). The mitral valve is abnormal. Moderate mitral valve regurgitation. No evidence of mitral valve stenosis. Tricuspid Valve: Peak  RV-RA gradient 18 mmHg. The tricuspid valve is normal in structure. Tricuspid valve regurgitation is mild. Aortic Valve: The aortic valve is tricuspid. Aortic valve regurgitation is not visualized. No aortic stenosis is present. Pulmonic Valve: The pulmonic valve was normal in structure. Pulmonic valve  regurgitation is not visualized. Aorta: Normal caliber thoracic aorta with no significant plaque. The aortic root is normal in size and structure. IAS/Shunts: No PFO/ASD by color doppler.  MR Peak grad:    69.6 mmHg MR Mean grad:    40.5 mmHg MR Vmax:         417.00 cm/s MR Vmean:        295.5 cm/s MR PISA:         3.08 cm MR PISA Eff ROA: 28 mm MR PISA Radius:  0.70 cm [name] MD Electronically signed by [name] MD Signature Date/Time: 12/07/2020/2:23:27 PM    Final    CUP PACEART REMOTE DEVICE CHECK  Result Date: 12/03/2020 Scheduled remote reviewed. Presenting rhythm a fib, transmission morning of TEE/DCCV 12/01/20 per epic notes.  39 ATR  events and 37 NSVT events all appeared a fib w/rvr, all events prior to TEE/DCCV.  Meds: Coreg, Digoxin, Warfarin.  Will forward to triage to monitor for recurrent a fib events.  Normal device function.  R. Powers LPN, CVRS Next remote 91 days.   Catarina Hartshorn, DO  [facility]  If 7PM-7AM, please contact night-coverage [email or web address] Password [email or web address] 12/15/2020, 7:28 AM   LOS: 0 days

## 2020-12-15 NOTE — ED Notes (Signed)
Someone to come assess [employer] in the morning around 8:30 so that information can be sent from pacemaker

## 2020-12-15 NOTE — ED Notes (Signed)
[facility] here to get pt 

## 2020-12-15 NOTE — ED Notes (Signed)
Nurse requested parameter for coreg. Dr. Arbutus Leas said give based on 0807 vital signs and only if newer vital signs were hr less than 60 or SBP less than 100 hold.

## 2020-12-15 NOTE — Progress Notes (Signed)
ANTICOAGULATION CONSULT NOTE - Initial Consult  Pharmacy Consult for warfarin Indication: atrial fibrillation  Allergies  Allergen Reactions  . Beef-Derived Products Anaphylaxis    From tick bite  . Metoclopramide Hcl Anaphylaxis    Non responsive  . Penicillins Anaphylaxis    Pt states she can take any cephalosporin (per pt. 01/01/19) Shock Has patient had a PCN reaction causing immediate rash, facial/tongue/throat swelling, SOB or lightheadedness with hypotension: Yes Has patient had a PCN reaction causing severe rash involving mucus membranes or skin necrosis: No Has patient had a PCN reaction that required hospitalization Yes Has patient had a PCN reaction occurring within the last 10 years: No If all of the above answers are "NO", then may proceed with Cephalosporin   . Pork-Derived Products Anaphylaxis    From tick bite  . Metoprolol Other (See Comments)    Syncope   . Reglan [Metoclopramide]   . Statins Other (See Comments)    Myalgias    Patient Measurements:   Heparin Dosing Weight:   Vital Signs: BP: 132/54 (12/17 0807) Pulse Rate: 62 (12/17 0807)  Labs: Recent Labs    12/13/20 1606 12/14/20 1921 12/15/20 0112 12/15/20 0354  HGB  --  13.8  --  13.9  HCT  --  42.4  --  44.8  PLT  --  97*  --  97*  APTT  --   --   --  50*  LABPROT  --   --   --  22.7*  INR 2.7  --   --  2.1*  CREATININE  --  0.85  --  0.75  TROPONINIHS  --  15 18*  --     Estimated Creatinine Clearance: 88.9 mL/min (by C-G formula based on SCr of 0.75 mg/dL).   Medical History: Past Medical History:  Diagnosis Date  . Aneurysm of internal carotid artery   . Anticardiolipin antibody positive    Chronic Coumadin  . Chronic combined systolic (congestive) and diastolic (congestive) heart failure (HCC)   . Cocaine abuse in remission (HCC)   . Coronary atherosclerosis of native coronary artery    Previous invasive cardiac testing was done at a facility in [place], [place].  Occluded  diagonal, Diffuse LAD disease, Occluded Cx, and non obstructive RCA.   . Essential hypertension   . Headache   . History of pneumonia   . Ischemic cardiomyopathy    LVEF 30-35%  . Mixed hyperlipidemia   . PVD (peripheral vascular disease) (HCC)   . Raynaud's phenomenon   . Stroke [facility]) 2007   Total occlusion of the right internal carotid    Medications:  (Not in a hospital admission)   Assessment: Pharmacy consulted to dose warfarin in patient with atrial fibrillation. Patient's admission INR is 2.1 with last dose given 12/15.  Home dose listed as 5 mg on MWFSat and 10 mg ROW.  Goal of Therapy:  INR 2-3 Monitor platelets by anticoagulation protocol: Yes   Plan:  Warfarin 5 mg x 1 dose Monitor daily INR and s/s of bleeding.  Judeth Cornfield, PharmD Clinical Pharmacist 12/15/2020 8:39 AM

## 2020-12-15 NOTE — Progress Notes (Signed)
ANTICOAGULATION CONSULT NOTE - Initial Consult  Pharmacy Consult for warfarin>>heparin Indication: atrial fibrillation. antiphospholipid antibody syndrome, recent cardioversion  Allergies  Allergen Reactions  . Beef-Derived Products Anaphylaxis    From tick bite  . Metoclopramide Hcl Anaphylaxis    Non responsive  . Penicillins Anaphylaxis    Pt states she can take any cephalosporin (per pt. 01/01/19) Shock Has patient had a PCN reaction causing immediate rash, facial/tongue/throat swelling, SOB or lightheadedness with hypotension: Yes Has patient had a PCN reaction causing severe rash involving mucus membranes or skin necrosis: No Has patient had a PCN reaction that required hospitalization Yes Has patient had a PCN reaction occurring within the last 10 years: No If all of the above answers are "NO", then may proceed with Cephalosporin   . Pork-Derived Products Anaphylaxis    From tick bite  . Metoprolol Other (See Comments)    Syncope   . Reglan [Metoclopramide]   . Statins Other (See Comments)    Myalgias    Patient Measurements:   Heparin Dosing Weight:   Vital Signs: BP: 87/56 (12/17 1100) Pulse Rate: 63 (12/17 1100)  Labs: Recent Labs    12/13/20 1606 12/14/20 1921 12/15/20 0112 12/15/20 0354  HGB  --  13.8  --  13.9  HCT  --  42.4  --  44.8  PLT  --  97*  --  97*  APTT  --   --   --  50*  LABPROT  --   --   --  22.7*  INR 2.7  --   --  2.1*  CREATININE  --  0.85  --  0.75  TROPONINIHS  --  15 18*  --     Estimated Creatinine Clearance: 88.9 mL/min (by C-G formula based on SCr of 0.75 mg/dL).   Medical History: Past Medical History:  Diagnosis Date  . Aneurysm of internal carotid artery   . Anticardiolipin antibody positive    Chronic Coumadin  . Brain aneurysm    followed by Dr. Corliss Skains  . Chronic combined systolic (congestive) and diastolic (congestive) heart failure (HCC)   . Cocaine abuse in remission (HCC)   . Coronary atherosclerosis of  native coronary artery    Previous invasive cardiac testing was done at a facility in [place], [place].  Occluded diagonal, Diffuse LAD disease, Occluded Cx, and non obstructive RCA.   . Essential hypertension   . Headache   . History of pneumonia   . ICD (implantable cardioverter-defibrillator) in place   . Ischemic cardiomyopathy    LVEF 30-35%  . Mixed hyperlipidemia   . NSVT (nonsustained ventricular tachycardia) (HCC)   . Persistent atrial fibrillation (HCC)   . PVD (peripheral vascular disease) (HCC)   . Raynaud's phenomenon   . Statin intolerance   . Stroke [facility]) 2007   Total occlusion of the right internal carotid    Medications:  (Not in a hospital admission)   Assessment: Pharmacy consulted to dose warfarin in patient with atrial fibrillation. Patient's admission INR is 2.1 with last dose given 12/15.  Home dose listed as 5 mg on MWFSat and 10 mg ROW.  INR 2.1- therapeutic  Goal of Therapy:  INR 2-3 Monitor platelets by anticoagulation protocol: Yes   Plan:  Holding warfarin. Start heparin when INR < 2. Likely 12/18 AM Monitor daily INR and s/s of bleeding.  Judeth Cornfield, PharmD Clinical Pharmacist 12/15/2020 11:31 AM

## 2020-12-15 NOTE — Consult Note (Addendum)
Cardiology Consultation:   Patient ID: [name] MRN: [id number]; DOB: [birth year]  Admit date: 12/14/2020 Date of Consult: 12/15/2020  Primary Care Provider: Ignatius Specking, MD [facility] Cardiologist: [name] Dell, MD / Dr. Shirlee [name] (CHF) [facility] Electrophysiologist:  [name] Bunting, MD / Afib clinic   Patient Profile:   [name] [name] is a 59 y.o. 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 Corliss [name], PAD (prior angio 2015 -> med rx), tobacco abuse, HLD (statin intolerant), VT (prior remote shocks for this) with NSVT by prior device interrogation who is being seen today for the evaluation of syncope at the request of Dr. Arbutus [name].  History of Present Illness:   [name] [name] historically has been followed by Dr. Diona [name] but recently established care with the CHF clinic. She also follows with Dr. Ladona Ridgel for EP. She saw Dr. Shirlee [name] OV 11/28/20. She had recently gone into atrial fib with worsening CHF symptoms. He recommended to arrange for TEE-DCCV, increased spironolactone, added Farxiga, and felt he'd eventually like to pursue repeat R/LHC to look for revascularizable CAD and assess filling pressures and cardiac output. She underwent TEE/DCCV on 12/3 with mildly dilated LV, EF 25-30%, normal RV size with mildly decreased systolic function, moderate LAE, no LAA thrombus, mild RAE, moderate MR, mild TR -> DCCV performed with conversion to NSR.  She was in [place] yesterday feeling well when a friend was visiting. She was sitting at a table. Without warning her friend told her she had promptly passed out/slumped her head forward with eyes open. She quickly came to  and heard her friend calling her name. She denies any preceding or post-syncope cardiac symptoms. She did not sustain any injuries with this. Due to sx she came to the ED. Presenting BP 71/43 with repeated f/u value 105/58. Did not have formal orthostatics yet but BP lying this AM 132/54 and sitting 138/54 therefore home meds were continued.  Device interrogation was performed this AM which demonstrated rapid VT 261bpm yesterday afternoon corresponding to time of syncope followed by ATP then shock x1 with successful conversion to NSR per d/w Bos Sci rep. Interrogation report also reports includes prior shock in 2017, 2018 and 02/2019. Rep also reports she has had 231,000 PVCs since last check 12/3 (no % provided). Labs show mild hyponatremia 133, K 3.7-3.8, Mg 2.3, Cr 0.85, calcium 8.4, BNP 209, hsTroponin 15-18, CBC ok except platelets 97, INR 2.7, A1C 5.9, Covid negative.   While evaluating the patient this AM she reports feeling fine except she feels her rhythm is jumping around (NSR with frequent PVCs). She is quite hypotensive at time of evaluation, confirmed manually, at 78/42 - she is not currently dizzy and has no CP, edema, or dyspnea. Discussed with [name] [name] and saline bolus ordered. She reports volume status has been stable at home and if anything she's been losing weight - she has been trying but does report it is faster than she expected to. TSH pending.  Past Medical History:  Diagnosis Date  . Aneurysm of internal carotid artery   . Anticardiolipin antibody positive    Chronic Coumadin  . Brain aneurysm    followed by Dr. Corliss [name]  . Chronic combined systolic (congestive) and diastolic (congestive) heart failure (HCC)   .  Cocaine abuse in remission (HCC)   . Coronary atherosclerosis of native coronary artery    Previous invasive cardiac testing was done at a facility in [place], [place].  Occluded diagonal, Diffuse LAD disease, Occluded Cx, and non obstructive RCA.   . Essential  hypertension   . Headache   . History of pneumonia   . ICD (implantable cardioverter-defibrillator) in place   . Ischemic cardiomyopathy    LVEF 30-35%  . Mixed hyperlipidemia   . NSVT (nonsustained ventricular tachycardia) (HCC)   . Persistent atrial fibrillation (HCC)   . PVD (peripheral vascular disease) (HCC)   . Raynaud's phenomenon   . Statin intolerance   . Stroke [facility]) 2007   Total occlusion of the right internal carotid    Past Surgical History:  Procedure Laterality Date  . ABDOMINAL AORTAGRAM N/A 05/25/2014   Procedure: ABDOMINAL Ronny Flurry;  Surgeon: [name] Ouch, MD;  Location: [facility] CATH LAB;  Service: Cardiovascular;  Laterality: N/A;  . APPENDECTOMY    . CARDIAC DEFIBRILLATOR PLACEMENT     [employer] ICD  . CARDIOVERSION N/A 12/01/2020   Procedure: CARDIOVERSION;  Surgeon: [name] Morale, MD;  Location: [facility] ENDOSCOPY;  Service: Cardiovascular;  Laterality: N/A;  . EP IMPLANTABLE DEVICE N/A 03/01/2016   Procedure: ICD Generator Changeout;  Surgeon: [name] Maw, MD;  Location: [facility] INVASIVE CV LAB;  Service: Cardiovascular;  Laterality: N/A;  . L1 corpectomy   12/10  . Laryngeal polyp excision    . TEE WITHOUT CARDIOVERSION N/A 12/01/2020   Procedure: TRANSESOPHAGEAL ECHOCARDIOGRAM (TEE);  Surgeon: [name] Morale, MD;  Location: [facility] ENDOSCOPY;  Service: Cardiovascular;  Laterality: N/A;     Home Medications:  Prior to Admission medications   Medication Sig Start Date End Date Taking? Authorizing Provider  aspirin EC 81 MG tablet Take 81 mg by mouth daily. Swallow whole.   Yes [provider]  carvedilol (COREG) 12.5 MG tablet TAKE 1 TABLET BY MOUTH TWICE DAILY WITH MEALS Patient taking differently: Take 12.5 mg by mouth 2 (two) times daily with a meal. 06/21/20  Yes [name] Sidle, MD  dapagliflozin propanediol (FARXIGA) 10 MG TABS tablet Take 1 tablet (10 mg total) by mouth daily before breakfast. 11/28/20  Yes [name] Morale, MD  digoxin (LANOXIN)  0.125 MG tablet Take 1 tablet (0.125 mg total) by mouth daily. 08/15/20 11/17/21 Yes Shah, Pratik D, DO  DULoxetine (CYMBALTA) 30 MG capsule Take 30 mg by mouth daily.   Yes [provider]  DULoxetine (CYMBALTA) 60 MG capsule Take 60 mg by mouth every evening.   Yes [provider]  ezetimibe (ZETIA) 10 MG tablet Take 1 tablet (10 mg total) by mouth daily. 08/16/20 11/17/21 Yes Shah, Pratik D, DO  famotidine (PEPCID) 20 MG tablet Take 1 tablet (20 mg total) by mouth 2 (two) times daily. Patient taking differently: Take 20 mg by mouth daily. 01/06/19 08/25/21 Yes Kari Baars, MD  gabapentin (NEURONTIN) 300 MG capsule Take 300 mg by mouth See admin instructions. Take 300 mg  in the AM and 600 mg  in the PM   Yes [provider]  Multiple Vitamins-Minerals (MULTIVITAMINS THER. W/MINERALS) TABS Take 1 tablet by mouth daily.     Yes [provider]  oxyCODONE-acetaminophen (PERCOCET) 10-325 MG tablet Take 1 tablet by mouth every 4 (four) hours as needed for pain.   Yes [provider]  potassium chloride SA (KLOR-CON) 20 MEQ tablet TAKE 1 TABLET BY MOUTH EVERY DAY Patient taking differently: Take  20 mEq by mouth daily. 08/10/20  Yes [name] Sidle, MD  promethazine (PHENERGAN) 25 MG tablet Take 25 mg by mouth every 6 (six) hours as needed for nausea. With pain medication   Yes [provider]  sacubitril-valsartan (ENTRESTO) 24-26 MG Take 1 tablet by mouth 2 (two) times daily. 10/28/20  Yes Leone Brand, NP  spironolactone (ALDACTONE) 25 MG tablet Take 1 tablet (25 mg total) by mouth daily. Patient taking differently: Take 25 mg by mouth at bedtime. 11/28/20  Yes [name] Morale, MD  torsemide (DEMADEX) 20 MG tablet Take 4 tablets (80 mg total) by mouth daily. May take extra tablet as needed 09/21/20 11/17/21 Yes [name] Sidle, MD  warfarin (COUMADIN) 10 MG tablet TAKE 1 TABLET BY MOUTH EVERY DAY EXCEPT 1/2 TABLET ON MONDAYS AND  THURSDAYS Patient taking differently: Take 10 mg by mouth See admin instructions. Take 5 mg Mon. Wed, Friday , and Saturday All the other days take 10 mg in the evening 10/09/20  Yes [name] Sidle, MD  fluticasone [facility]) 50 MCG/ACT nasal spray Place 1 spray into both nostrils daily as needed for allergies.  09/24/19   [provider]  nitroGLYCERIN (NITROSTAT) 0.4 MG SL tablet PLACE 1 TABLET UNDER THE TONGUE EVERY 5 MINUTES FOR 3 DOSES AS NEEDED CHEST PAIN Patient taking differently: Place 0.4 mg under the tongue every 5 (five) minutes as needed for chest pain. 05/04/19   [name] Sidle, MD    Inpatient Medications: Scheduled Meds: . aspirin EC  81 mg Oral Daily  . digoxin  0.125 mg Oral Daily  . ezetimibe  10 mg Oral Daily  . famotidine  20 mg Oral Daily  . insulin aspart  0-5 Units Subcutaneous QHS  . insulin aspart  0-9 Units Subcutaneous TID WC  . potassium chloride SA  20 mEq Oral Daily  . potassium chloride  20 mEq Oral Once  . warfarin  5 mg Oral ONCE-1600  . Warfarin - Pharmacist Dosing Inpatient   Does not apply q1600   Continuous Infusions:  PRN Meds: acetaminophen, oxyCODONE-acetaminophen  Allergies:    Allergies  Allergen Reactions  . Beef-Derived Products Anaphylaxis    From tick bite  . Metoclopramide Hcl Anaphylaxis    Non responsive  . Penicillins Anaphylaxis    Pt states she can take any cephalosporin (per pt. 01/01/19) Shock Has patient had a PCN reaction causing immediate rash, facial/tongue/throat swelling, SOB or lightheadedness with hypotension: Yes Has patient had a PCN reaction causing severe rash involving mucus membranes or skin necrosis: No Has patient had a PCN reaction that required hospitalization Yes Has patient had a PCN reaction occurring within the last 10 years: No If all of the above answers are "NO", then may proceed with Cephalosporin   . Pork-Derived Products Anaphylaxis    From tick bite  . Metoprolol Other (See  Comments)    Syncope   . Reglan [Metoclopramide]   . Statins Other (See Comments)    Myalgias    Social History:   Social History   Socioeconomic History  . Marital status: Single    Spouse name: Not on file  . Number of children: Not on file  . Years of education: Not on file  . Highest education level: Not on file  Occupational History  . Occupation: Disabled    Comment: ESL  Tobacco Use  . Smoking status: Current Every Day Smoker    Packs/day: 1.00    Years: 43.00  Pack years: 43.00    Types: Cigarettes    Start date: 03/03/1978    Last attempt to quit: 10/03/2013    Years since quitting: 7.2  . Smokeless tobacco: Never Used  . Tobacco comment: 4 ciggs per day  Vaping Use  . Vaping Use: Every day  Substance and Sexual Activity  . Alcohol use: No    Alcohol/week: 0.0 standard drinks  . Drug use: No    Comment: Prior history of cocaine  . Sexual activity: Yes    Birth control/protection: None  Other Topics Concern  . Not on file  Social History Narrative  . Not on file   Social Determinants of Health   Financial Resource Strain: Not on file  Food Insecurity: Not on file  Transportation Needs: Not on file  Physical Activity: Not on file  Stress: Not on file  Social Connections: Not on file  Intimate Partner Violence: Not on file    Family History:   Family History  Problem Relation Age of Onset  . Heart disease Father 19  . Ankylosing spondylitis Sister   . Stomach cancer Brother 38  . Heart attack Brother      ROS:  Please see the history of present illness.  All other ROS reviewed and negative.     Physical Exam/Data:   Vitals:   12/15/20 0952 12/15/20 1000 12/15/20 1015 12/15/20 1022  BP:  (!) 88/47 (!) 79/45 (!) 78/42  Pulse: 79 61 62   Resp:  15 14   Temp:      TempSrc:      SpO2:  94% 96%    No intake or output data in the 24 hours ending 12/15/20 1036 Last 3 Weights 12/01/2020 11/28/2020 11/21/2020  Weight (lbs) 208 lb 209 lb 3.2  oz 212 lb 3.2 oz  Weight (kg) 94.348 kg 94.892 kg 96.253 kg     There is no height or weight on file to calculate BMI.  Vital Signs. BP (!) 78/42 (BP Location: Left Arm) Comment: PA-C Aware  Pulse 62   Temp (!) 97.4 F (36.3 C) (Oral)   Resp 14   SpO2 96%  General: Well developed, well nourished WF, in no acute distress. Head: Normocephalic, atraumatic, sclera non-icteric, no xanthomas, nares are without discharge. Neck: Negative for carotid bruits. JVP not elevated. Lungs: Clear bilaterally to auscultation without wheezes, rales, or rhonchi. Breathing is unlabored. Heart: RRR S1 S2 without murmurs, rubs, or gallops.  Abdomen: Soft, non-tender, non-distended with normoactive bowel sounds. No rebound/guarding. Extremities: No clubbing or cyanosis. No edema. Distal pedal pulses are 2+ and equal bilaterally. Neuro: Alert and oriented X 3. Moves all extremities spontaneously. Psych:  Responds to questions appropriately with a normal affect.  EKG:  The EKG was personally reviewed and demonstrates: NSR 66bpm with intermittent atrial pacing, NSIVCD, multifocal PVCs, TWI inferiorly and V5-V6 (TW changes similar to prior),.Burnard Leigh   Telemetry:  Telemetry was personally reviewed and demonstrates:  NSR frequent multifocal PVCs 9 beat NSVT yesterday  Relevant CV Studies: TEE 12/01/20 1. Left ventricular ejection fraction, by estimation, is 25 to 30%. The  left ventricle has severely decreased function. The left ventricle  demonstrates regional wall motion abnormalities with severe hypokinesis to  akinesis of the anterior and septal  walls. The left ventricular internal cavity size was mildly dilated.  2. Right ventricular systolic function is mildly reduced. The right  ventricular size is normal.  3. Left atrial size was moderately dilated. No left atrial/left atrial  appendage thrombus was detected.  4. Right atrial size was mildly dilated.  5. There was moderate mitral regurgitation  with ERO 0.26 cm^2 by PISA  (suspect functional MR). No evidence of mitral stenosis.  6. No PFO/ASD by color doppler.  7. The aortic valve is tricuspid. Aortic valve regurgitation is not  visualized. No aortic stenosis is present.  8. Peak RV-RA gradient 18 mmHg.  9. Normal caliber thoracic aorta with no significant plaque.   Laboratory Data:  High Sensitivity Troponin:   Recent Labs  Lab 12/14/20 1921 12/15/20 0112  TROPONINIHS 15 18*     Chemistry Recent Labs  Lab 12/14/20 1921 12/15/20 0354  NA 133* 134*  K 3.7 3.8  CL 97* 98  CO2 28 28  GLUCOSE 115* 97  BUN 13 12  CREATININE 0.85 0.75  CALCIUM 8.4* 8.6*  GFRNONAA >60 >60  ANIONGAP 8 8    Recent Labs  Lab 12/15/20 0354  PROT 6.9  ALBUMIN 3.3*  AST 16  ALT 18  ALKPHOS 76  BILITOT 0.5   Hematology Recent Labs  Lab 12/14/20 1921 12/15/20 0354  WBC 8.5 8.6  RBC 4.56 4.74  HGB 13.8 13.9  HCT 42.4 44.8  MCV 93.0 94.5  MCH 30.3 29.3  MCHC 32.5 31.0  RDW 13.0 12.8  PLT 97* 97*   BNP Recent Labs  Lab 12/14/20 1921  BNP 209.0*    DDimer No results for input(s): DDIMER in the last 168 hours.   Radiology/Studies:  DG Chest Portable 1 View  Result Date: 12/14/2020 CLINICAL DATA:  Syncope syncope EXAM: PORTABLE CHEST 1 VIEW COMPARISON:  08/11/2020 FINDINGS: Left AICD remains in place, unchanged. Cardiomegaly. Linear areas of scarring in the mid lungs bilaterally. No confluent opacities, effusions or edema. IMPRESSION: Cardiomegaly.  Chronic changes.  No active disease. Electronically Signed   By: Charlett Nose M.D.   On: 12/14/2020 23:32     Assessment and Plan:   1. Syncope due to ventricular tachycardia s/p ICD shock (with prior hx of sustained VT), also frequent PVCs - Mg 2.3, K 3.7-3.8 this admission - on KCl daily so will supp with additional now - also significant hypotension this admission (on arrival VS to ED and again this AM) so holding antihypertensives - will keep KCl on  for now but please trend potassium level daily for appropriateness to continue - anticipate txfer to [facility] on cardiology service for EP input -- keep NPO - will finalize plans with MD - check TSH, digoxin level, UDS for completeness given remote h/o cocaine - may need amiodarone rx  2. Hypotension  - fortunately relatively asymptomatic at the moment but need to follow closely (baseline BP is low per patient but not usually this low) - bolus with NS now - got carvedilol, torsemide, Entresto, spironolactone this AM - hold these pending further stability of BP  3. Chronic combined CHF - volume status looks ok - hold meds for now as above  - would likely benefit from input from CHF svc this admission  4. CAD - hsTroponin low/flat 15-18 without antecedent worsening angina, will f/u value this AM - continue ASA - otherwise meds held as above - may need ischemic eval this admission due to VT  5. Persistent afib s/p recent DCCV 12/01/20 (also hx of CVA, anticardiolipin ab positive) - maintaining NSR - will discuss Coumadin rx/possible heparin with MD as she may need cath this admission given recurrent VT   6. Weight loss - check thyroid,  will need close f/u of this as OP as well  7. Thrombocytopenia - fluctuating issue in the past as well (in context of acute illness with influenza/CAP) - follow closely during admission, may need heme eval if persists  8. PAD - carotid US under way by IM team - good pulses bilaterally  New York Heart Association (NYHA) Functional Class NYHA Class II   CHA2DS2-VASc Score = 6  This indicates a 9.7% annual risk of stroke. The patient's score is based upon: CHF History: Yes HTN History: Yes Diabetes History: No Stroke History: Yes Vascular Disease History: Yes Age Score: 0 Gender Score: 1         For questions or updates, please contact [facility] Please consult [email or web address] for contact info under    Signed, Laurann Montana, PA-C   12/15/2020 10:36 AM  Patient discussed with PA Dunn, I agree with her documentaiton. 59 yo female history of antiphospholipid syndrome on coumadin and ASA, CVA, CAD/ICM MI in 2008 in [facility] cath at the time showed totally occluded diagonal, totally occluded LCX, and diffuse LAD disease, afib with recent TEE/DCCV 11/2020, brain aneurysm,,PAD, chronic systolic HF LVEF 16-10% with ICD.   Patient presents with syncope. Sudden LOC without any warning or symptoms. Device interrogation shows VT, initially attempted ATP which was unsuccesful then converted with a single shock back to SR.   07/2020 echo: LVEF 15-20% 11/2020 TEE: LVEF 25-30%, mod MR K 3.7 Mg 2.3  Prior device checks have shown NSVT, high PVC burden. Unclear if related to just her chronic systolic dysfunction or perhaps a component of chronic ischemia. EKG and enzymes are not consistent with acute ischemia, she has not had active chest pains at home and in fact had been feeling well over the last several weeks.    CHF team had considered RHC/LHC one month after recent DCCV (12/01/20) so as not to interrupt anticoag. I think given this episode would plan for inpatient study Monday once INR has come down. Will need heparin gtt when INR <2 given antiphospholipid syndrome, likely will need tomorrow.    Soft bp's, would not be able to uptitrate beta blocker today. With VT and prior poorly tolerated afib amio would be an option. On tele frequent ectopy and short runs of NSVT, in this setting we will go ahead and start amio gtt today.   Hypotension this AM after receving AM meds. Given NS bolus, SBPs getting back to her baseline 90s to 100s, has been asymptoamtic. Hold her entresto and aldactone, lower coreg to 6.25mg  bid with hold parameters, hopefully will be able to get PM dose. Can add back meds as bp's settle out. Hold diuretic.    Dina Rich MD

## 2020-12-16 LAB — BASIC METABOLIC PANEL
Anion gap: 10 (ref 5–15)
Anion gap: 10 (ref 5–15)
BUN: 9 mg/dL (ref 6–20)
BUN: 8 mg/dL (ref 6–20)
CO2: 27 mmol/L (ref 22–32)
CO2: 27 mmol/L (ref 22–32)
Calcium: 8.7 mg/dL — ABNORMAL LOW (ref 8.9–10.3)
Calcium: 8.7 mg/dL — ABNORMAL LOW (ref 8.9–10.3)
Chloride: 101 mmol/L (ref 98–111)
Chloride: 102 mmol/L (ref 98–111)
Creatinine, Ser: 0.73 mg/dL (ref 0.44–1.00)
Creatinine, Ser: 0.79 mg/dL (ref 0.44–1.00)
GFR, Estimated: >60 mL/min (ref >60)
GFR, Estimated: >60 mL/min (ref >60)
Glucose, Bld: 94 mg/dL (ref 70–99)
Glucose, Bld: 93 mg/dL (ref 70–99)
Potassium: 3.4 mmol/L — ABNORMAL LOW (ref 3.5–5.1)
Potassium: 3.5 mmol/L (ref 3.5–5.1)
Sodium: 138 mmol/L (ref 135–145)
Sodium: 139 mmol/L (ref 135–145)

## 2020-12-16 LAB — CBC
HCT: 41.2 % (ref 36.0–46.0)
HCT: 41.9 % (ref 36.0–46.0)
Hemoglobin: 13.7 g/dL (ref 12.0–15.0)
Hemoglobin: 13.4 g/dL (ref 12.0–15.0)
MCH: 30.4 pg (ref 26.0–34.0)
MCH: 29.3 pg (ref 26.0–34.0)
MCHC: 33.3 g/dL (ref 30.0–36.0)
MCHC: 32 g/dL (ref 30.0–36.0)
MCV: 91.4 fL (ref 80.0–100.0)
MCV: 91.7 fL (ref 80.0–100.0)
Platelets: 102 10*3/uL — ABNORMAL LOW (ref 150–400)
Platelets: 96 10*3/uL — ABNORMAL LOW (ref 150–400)
RBC: 4.51 MIL/uL (ref 3.87–5.11)
RBC: 4.57 MIL/uL (ref 3.87–5.11)
RDW: 13.2 % (ref 11.5–15.5)
RDW: 13.1 % (ref 11.5–15.5)
WBC: 8 10*3/uL (ref 4.0–10.5)
WBC: 8.1 10*3/uL (ref 4.0–10.5)
nRBC: 0 % (ref 0.0–0.2)
nRBC: 0 % (ref 0.0–0.2)

## 2020-12-16 LAB — PROTIME-INR
INR: 1.4 — ABNORMAL HIGH (ref 0.8–1.2)
INR: 1.4 — ABNORMAL HIGH (ref 0.8–1.2)
Prothrombin Time: 16.6 seconds — ABNORMAL HIGH (ref 11.4–15.2)
Prothrombin Time: 16.3 seconds — ABNORMAL HIGH (ref 11.4–15.2)

## 2020-12-16 LAB — LIPID PANEL
Cholesterol: 120 mg/dL (ref 0–200)
HDL: 31 mg/dL — ABNORMAL LOW (ref >40)
LDL Cholesterol: 72 mg/dL (ref 0–99)
Total CHOL/HDL Ratio: 3.9 RATIO
Triglycerides: 85 mg/dL (ref <150)
VLDL: 17 mg/dL (ref 0–40)

## 2020-12-16 LAB — GLUCOSE, CAPILLARY
Glucose-Capillary: 75 mg/dL (ref 70–99)
Glucose-Capillary: 242 mg/dL — ABNORMAL HIGH (ref 70–99)
Glucose-Capillary: 103 mg/dL — ABNORMAL HIGH (ref 70–99)
Glucose-Capillary: 113 mg/dL — ABNORMAL HIGH (ref 70–99)

## 2020-12-16 LAB — MAGNESIUM
Magnesium: 2.1 mg/dL (ref 1.7–2.4)
Magnesium: 2.1 mg/dL (ref 1.7–2.4)

## 2020-12-16 LAB — DIGOXIN LEVEL: Digoxin Level: 1 ng/mL (ref 0.8–2.0)

## 2020-12-16 MED ORDER — HEPARIN (PORCINE) 25000 UT/250ML-% IV SOLN
1400.0000 [IU]/h | INTRAVENOUS | Status: DC
  Administered 2020-12-16: 1300 [IU]/h via INTRAVENOUS
  Administered 2020-12-17: 1450 [IU]/h via INTRAVENOUS
  Administered 2020-12-18: 1400 [IU]/h via INTRAVENOUS
  Filled 2020-12-16 (×3): qty 250

## 2020-12-16 MED ORDER — INFLUENZA VAC SPLIT QUAD 0.5 ML IM SUSY
0.5000 mL | PREFILLED_SYRINGE | INTRAMUSCULAR | Status: AC
  Administered 2020-12-17: 0.5 mL via INTRAMUSCULAR
  Filled 2020-12-16: qty 0.5

## 2020-12-16 MED ORDER — OXYCODONE-ACETAMINOPHEN 5-325 MG PO TABS
1.0000 | ORAL_TABLET | Freq: Four times a day (QID) | ORAL | Status: DC | PRN
  Administered 2020-12-16 – 2020-12-18 (×8): 1 via ORAL
  Filled 2020-12-16 (×8): qty 1

## 2020-12-16 NOTE — Progress Notes (Signed)
Spoke with RN about order for PIV in opposite arm of amiodarone. Educated both Charity fundraiser and patient that as long as they were in different veins, they could be in the same arm. Also told patient she had a large infiltrate in the right arm and that limited her arm for IV start. She stated that she didn't want to get anyone in trouble and was told to have them in separate arms. PIV started in right arm per patient request.

## 2020-12-16 NOTE — Progress Notes (Signed)
Pt's pain is managed with Oxy. Pt's appetite is adequate. Pt's needs are met and safety measures are maintained. Heparin drip started on the right upper extremity.

## 2020-12-16 NOTE — Progress Notes (Signed)
Patient stated that right [facility] where old PIV was inserted at [facility] is getting larger and redder. Patient currently had amiodarone infusing at a site in her RAFA. Fluids stopped, 2 PIV started in left arm. Instructed patient that she could put warmth or ice on her arm and to keep it elevated. If she is to be on amiodarone for a while, she could benefit from a PICC line. Doristine Section., RN made aware.

## 2020-12-16 NOTE — Plan of Care (Signed)
  Problem: Activity: Goal: Risk for activity intolerance will decrease Outcome: Progressing   Problem: Safety: Goal: Ability to remain free from injury will improve Outcome: Progressing   

## 2020-12-16 NOTE — Progress Notes (Addendum)
Progress Note  Patient Name: [name] [name] Date of Encounter: 12/17/2020  [facility] Cardiologist: Nona Dell, MD   Subjective   Doing better this morning. Patient with episode of chest tightness overnight. Given lasix 20mg  IV with improvement.   Blood pressures significantly improved with systolic 110-130s Had 10 beat runs NSVT; no sustained VT Not on torsemide; Wt up but I/Os significantly negative (inaccurate)   Inpatient Medications    Scheduled Meds: . aspirin EC  81 mg Oral Daily  . carvedilol  6.25 mg Oral BID WC  . digoxin  0.125 mg Oral Daily  . DULoxetine  30 mg Oral Daily  . DULoxetine  60 mg Oral QPM  . ezetimibe  10 mg Oral Daily  . famotidine  20 mg Oral Daily  . gabapentin  300 mg Oral Daily  . gabapentin  600 mg Oral QHS  . influenza vac split quadrivalent PF  0.5 mL Intramuscular Tomorrow-1000  . insulin aspart  0-5 Units Subcutaneous QHS  . insulin aspart  0-9 Units Subcutaneous TID WC  . multivitamin with minerals  1 tablet Oral Daily  . potassium chloride SA  20 mEq Oral Daily   Continuous Infusions: . amiodarone 30 mg/hr (12/17/20 0322)  . heparin 1,450 Units/hr (12/17/20 0739)   PRN Meds: acetaminophen, oxyCODONE-acetaminophen   Vital Signs    Vitals:   12/16/20 1647 12/16/20 1744 12/16/20 2043 12/17/20 0432  BP: (!) 118/58 (!) 108/53 (!) 124/44 136/79  Pulse: (!) 57  60 64  Resp: 18  16 18   Temp: 97.7 F (36.5 C)  97.7 F (36.5 C) 97.6 F (36.4 C)  TempSrc: Oral  Oral Oral  SpO2: 98%  100% 90%  Weight:    96.6 kg    Intake/Output Summary (Last 24 hours) at 12/17/2020 0757 Last data filed at 12/17/2020 0731 Gross per 24 hour  Intake 356.99 ml  Output 4301 ml  Net -3944.01 ml   Last 3 Weights 12/17/2020 12/16/2020 12/15/2020  Weight (lbs) 212 lb 15.4 oz 206 lb 12.7 oz 205 lb 4.8 oz  Weight (kg) 96.6 kg 93.8 kg 93.123 kg      Telemetry    NSR, occasional A-pacing, 10 beat run NSVT, frequent PVCs - Personally  Reviewed  ECG    No new tracing - Personally Reviewed  Physical Exam   GEN: No acute distress.   Neck: No JVD Cardiac: RRR, no murmurs, rubs, or gallops.  Respiratory: Clear to auscultation bilaterally. GI: Soft, nontender, non-distended  MS: No edema; No deformity. Neuro:  Nonfocal  Psych: Normal affect   Labs    High Sensitivity Troponin:   Recent Labs  Lab 12/14/20 1921 12/15/20 0112 12/15/20 1057  TROPONINIHS 15 18* 13      Chemistry Recent Labs  Lab 12/15/20 0354 12/16/20 0647 12/17/20 0010  NA 134* 139 132*  K 3.8 3.5 3.8  CL 98 102 98  CO2 28 27 25   GLUCOSE 97 93 97  BUN 12 8 6   CREATININE 0.75 0.79 0.70  CALCIUM 8.6* 8.7* 8.4*  PROT 6.9  --   --   ALBUMIN 3.3*  --   --   AST 16  --   --   ALT 18  --   --   ALKPHOS 76  --   --   BILITOT 0.5  --   --   GFRNONAA >60 >60 >60  ANIONGAP 8 10 9      Hematology Recent Labs  Lab 12/15/20 0354 12/16/20  12/17/20 0010  WBC 8.6 8.1 8.6  RBC 4.74 4.57 4.09  HGB 13.9 13.4 12.4  HCT 44.8 41.9 37.1  MCV 94.5 91.7 90.7  MCH 29.3 29.3 30.3  MCHC 31.0 32.0 33.4  RDW 12.8 13.1 13.2  PLT 97* 96* 99*    BNP Recent Labs  Lab 12/14/20 1921  BNP 209.0*     DDimer No results for input(s): DDIMER in the last 168 hours.   Radiology    US Carotid Bilateral  Result Date: 12/15/2020 CLINICAL DATA:  Right carotid artery occlusion. EXAM: BILATERAL CAROTID DUPLEX ULTRASOUND TECHNIQUE: Wallace Cullens scale imaging, color Doppler and duplex ultrasound were performed of bilateral carotid and vertebral arteries in the neck. COMPARISON:  10/07/2014 and CT a 07/28/2017 FINDINGS: Criteria: Quantification of carotid stenosis is based on velocity parameters that correlate the residual internal carotid diameter with NASCET-based stenosis levels, using the diameter of the distal internal carotid lumen as the denominator for stenosis measurement. The following velocity measurements were obtained: RIGHT ICA: Occluded CCA: 67/7  cm/sec ECA: 290 cm/sec LEFT ICA: 128/25 cm/sec CCA: 93/19 cm/sec SYSTOLIC ICA/CCA RATIO:  0.4 ECA: 198 cm/sec RIGHT CAROTID ARTERY: Echogenic plaque at the right carotid bulb. External carotid artery is patent with normal waveform. Chronic occlusion of the proximal internal carotid artery. RIGHT VERTEBRAL ARTERY: Antegrade flow and normal waveform in the right vertebral artery. LEFT CAROTID ARTERY: Echogenic plaque at the left carotid bulb. External carotid artery is patent with normal waveform. Small amount of plaque in the proximal internal carotid artery. Normal waveforms in the internal carotid artery. LEFT VERTEBRAL ARTERY: Antegrade flow and normal waveform in the left vertebral artery. IMPRESSION: 1. 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 2. Small amount of plaque in the left internal carotid artery. Peak systolic velocity in the left internal artery has slightly elevated, now measuring up to 128 cm/sec. Estimated degree of stenosis in the left internal carotid artery is less than 50% based on the carotid artery ratio but recommend continued surveillance. 3. Patent vertebral arteries with antegrade flow. 4. Bilateral external carotid arteries are patent. Again noted is an elevated peak systolic velocity in the right external carotid artery. Electronically Signed   By: Richarda Overlie M.D.   On: 12/15/2020 12:50    Cardiac Studies   TEE 12/01/20 1. Left ventricular ejection fraction, by estimation, is 25 to 30%. The  left ventricle has severely decreased function. The left ventricle  demonstrates regional wall motion abnormalities with severe hypokinesis to  akinesis of the anterior and septal  walls. The left ventricular internal cavity size was mildly dilated.  2. Right ventricular systolic function is mildly reduced. The right  ventricular size is normal.  3. Left atrial size was moderately dilated. No left atrial/left atrial  appendage thrombus was detected.  4. Right atrial  size was mildly dilated.  5. There was moderate mitral regurgitation with ERO 0.26 cm^2 by PISA  (suspect functional MR). No evidence of mitral stenosis.  6. No PFO/ASD by color doppler.  7. The aortic valve is tricuspid. Aortic valve regurgitation is not  visualized. No aortic stenosis is present.  8. Peak RV-RA gradient 18 mmHg.  9. Normal caliber thoracic aorta with no significant plaque.  Patient Profile     59 y.o. female with history of antiphospholipid syndrome on coumadin and ASA, CVA, CAD/ICMMI in 2008 in [facility] cath at the time showed totally occluded diagonal, totally occluded LCX, and diffuse LAD disease, afib with recent TEE/DCCV 11/2020, brain aneurysm,PAD, chronic  systolic HF LVEF 98-26% with ICD who presented with syncope with device interrogation revealing VT with initial ATP (unsuccessful) followed by single shock now back to NSR.  Assessment & Plan    #Syncope due to ventricular tachycardias/p ICD shock (with prior hx of sustained VT) #Frequent PVCs Likely secondary to underlying chronic systolic heart failure with lower suspicion of new ischemia triggering event as ECG without ischemia and trops only mildly elevated. Unable to up-titrate BB due to relatively soft blood pressures. Started on amiodarone gtt. Episodes of NSVT but no sustained VT. Continues with frequent PVCs.  -EP to see Monday  -Continue amiodarone gtt--would not wean off until EP evaluation -Continue heparin gtt given antiphospholipid AB syndrome and Afib -Keep K>4, Mg>2; will need to trend these daily and replete aggressively  # Hypotension  Improved significantly this AM. Was asymptomatic with low pressures earlier on admission. Have been holding GDMT but will slowly add back. -Resume entresto and torsemide today -If stable, resume spiro tomorrow -Continue coreg with holding parameters -HF consulted  #Chronic combined CHF LVEF 15-20% on TTE in 07/2020 and 25-30% by TEE on 11/2020. With episode  of VT and syncope s/p ICD shock x1 as above.  Patient with chest tightness overnight felt to be secondary to edema in the setting of holding diuretics due to hypotension. Resolved with IV lasix. Hypotension resolved; resuming GDMT as tolerated today.  -Resume entresto and torsemide today -If stable, start spiro tomorrow -Continue coreg and digoxin -HF consulted; possible RHC/LHC during this admission  #CAD Low suspicion of acute ischemia as trigger for VT given ECG without ischemic changes and trop mildly elevated but flat. No antecedent ischemic symptoms. - Continue ASA - Does not tolerate statins; may need PSCK-9 inhibitor as out-patient due to significant CAD on cath in 2008 -Possible plan for RHC/LHC  # Persistent afib s/p recent DCCV 12/01/20 (also hx of CVA, anticardiolipin ab positive) -Continue heparin gtt as above  -Continue coreg -Remains in sinus currently  # Antiphospholipid AB syndrome: -Holding warfarin for now given possible plans for RHC/LHC -On heparin gtt as above -Continue ASA 81mg  daily  For questions or updates, please contact [facility] Please consult [email or web address] for contact info under        Signed, , MD  12/17/2020, 7:57 AM

## 2020-12-16 NOTE — Progress Notes (Signed)
ANTICOAGULATION CONSULT NOTE - Initial Consult  Pharmacy Consult for warfarin>>heparin Indication: atrial fibrillation. antiphospholipid antibody syndrome, recent cardioversion  Patient Measurements: Weight: 93.8 kg (206 lb 12.7 oz)  Vital Signs: Temp: 97.8 F (36.6 C) (12/18 0802) Temp Source: Oral (12/18 0802) BP: 104/87 (12/18 0802) Pulse Rate: 69 (12/18 0802)  Labs: Recent Labs    12/13/20 1606 12/14/20 1921 12/15/20 0112 12/15/20 0354 12/15/20 1057 12/16/20 0647  HGB  --  13.8  --  13.9  --   --   HCT  --  42.4  --  44.8  --   --   PLT  --  97*  --  97*  --   --   APTT  --   --   --  50*  --   --   LABPROT  --   --   --  22.7*  --  16.3*  INR 2.7  --   --  2.1*  --  1.4*  CREATININE  --  0.85  --  0.75  --  0.79  TROPONINIHS  --  15 18*  --  13  --     Estimated Creatinine Clearance: 88.7 mL/min (by C-G formula based on SCr of 0.79 mg/dL).  Assessment: Pharmacy consulted to dose warfarin in patient with atrial fibrillation. Patient's admission INR is 2.1 with last dose given 12/15.  Home dose listed as 5 mg on MWFSat and 10 mg ROW. INR is now <2, so initiating heparin.  Recent CBC is stable. No bolus due to recent warfarin. Documented pork-derived allergy due/to alpha gal. Cross-reactivity with heparin is rare, and patient has previously tolerated, per patient and charted history. Patient states she gets knots across her abdomen when using Lovenox, so this is a reason to avoid.   Goal of Therapy:  HL 0.3-0.7 Monitor platelets by anticoagulation protocol: Yes   Plan:  Heparin infusion at 1300 units/hr 6 hour HL Daily HL and CBC  Lamar Sprinkles, PharmD PGY1 Pharmacy Resident 12/16/2020 8:05 AM

## 2020-12-17 DIAGNOSIS — I2510 Atherosclerotic heart disease of native coronary artery without angina pectoris: Secondary | ICD-10-CM

## 2020-12-17 LAB — BASIC METABOLIC PANEL
Anion gap: 9 (ref 5–15)
BUN: 6 mg/dL (ref 6–20)
CO2: 25 mmol/L (ref 22–32)
Calcium: 8.4 mg/dL — ABNORMAL LOW (ref 8.9–10.3)
Chloride: 98 mmol/L (ref 98–111)
Creatinine, Ser: 0.7 mg/dL (ref 0.44–1.00)
GFR, Estimated: >60 mL/min (ref >60)
Glucose, Bld: 97 mg/dL (ref 70–99)
Potassium: 3.8 mmol/L (ref 3.5–5.1)
Sodium: 132 mmol/L — ABNORMAL LOW (ref 135–145)

## 2020-12-17 LAB — CBC
HCT: 37.1 % (ref 36.0–46.0)
Hemoglobin: 12.4 g/dL (ref 12.0–15.0)
MCH: 30.3 pg (ref 26.0–34.0)
MCHC: 33.4 g/dL (ref 30.0–36.0)
MCV: 90.7 fL (ref 80.0–100.0)
Platelets: 99 10*3/uL — ABNORMAL LOW (ref 150–400)
RBC: 4.09 MIL/uL (ref 3.87–5.11)
RDW: 13.2 % (ref 11.5–15.5)
WBC: 8.6 10*3/uL (ref 4.0–10.5)
nRBC: 0 % (ref 0.0–0.2)

## 2020-12-17 LAB — GLUCOSE, CAPILLARY
Glucose-Capillary: 96 mg/dL (ref 70–99)
Glucose-Capillary: 147 mg/dL — ABNORMAL HIGH (ref 70–99)
Glucose-Capillary: 83 mg/dL (ref 70–99)
Glucose-Capillary: 132 mg/dL — ABNORMAL HIGH (ref 70–99)
Glucose-Capillary: 86 mg/dL (ref 70–99)
Glucose-Capillary: 74 mg/dL (ref 70–99)

## 2020-12-17 LAB — PROTIME-INR
INR: 1.2 (ref 0.8–1.2)
Prothrombin Time: 14.5 seconds (ref 11.4–15.2)

## 2020-12-17 LAB — MAGNESIUM: Magnesium: 2 mg/dL (ref 1.7–2.4)

## 2020-12-17 LAB — HEPARIN LEVEL (UNFRACTIONATED)
Heparin Unfractionated: 0.23 IU/mL — ABNORMAL LOW (ref 0.30–0.70)
Heparin Unfractionated: 0.34 IU/mL (ref 0.30–0.70)
Heparin Unfractionated: 0.67 IU/mL (ref 0.30–0.70)

## 2020-12-17 MED ORDER — ASPIRIN 81 MG PO CHEW
81.0000 mg | CHEWABLE_TABLET | ORAL | Status: AC
  Administered 2020-12-18: 81 mg via ORAL
  Filled 2020-12-17: qty 1

## 2020-12-17 MED ORDER — SACUBITRIL-VALSARTAN 24-26 MG PO TABS
1.0000 | ORAL_TABLET | Freq: Two times a day (BID) | ORAL | Status: DC
  Administered 2020-12-17 – 2020-12-19 (×5): 1 via ORAL
  Filled 2020-12-17 (×5): qty 1

## 2020-12-17 MED ORDER — TORSEMIDE 20 MG PO TABS
80.0000 mg | ORAL_TABLET | Freq: Every day | ORAL | Status: DC
  Administered 2020-12-17 – 2020-12-19 (×3): 80 mg via ORAL
  Filled 2020-12-17 (×3): qty 4

## 2020-12-17 MED ORDER — SODIUM CHLORIDE 0.9 % IV SOLN: 250.0000 mL | INTRAVENOUS | Status: DC | PRN

## 2020-12-17 MED ORDER — FUROSEMIDE 10 MG/ML IJ SOLN
20.0000 mg | INTRAMUSCULAR | Status: AC
  Administered 2020-12-17: 20 mg via INTRAVENOUS
  Filled 2020-12-17: qty 2

## 2020-12-17 MED ORDER — SODIUM CHLORIDE 0.9% FLUSH
3.0000 mL | Freq: Two times a day (BID) | INTRAVENOUS | Status: DC
  Administered 2020-12-17 (×2): 3 mL via INTRAVENOUS

## 2020-12-17 MED ORDER — METHOCARBAMOL 500 MG PO TABS
750.0000 mg | ORAL_TABLET | Freq: Three times a day (TID) | ORAL | Status: DC | PRN
  Administered 2020-12-17 – 2020-12-19 (×4): 750 mg via ORAL
  Filled 2020-12-17 (×5): qty 2

## 2020-12-17 MED ORDER — SODIUM CHLORIDE 0.9 % IV SOLN: INTRAVENOUS | Status: DC

## 2020-12-17 MED ORDER — SODIUM CHLORIDE 0.9% FLUSH: 3.0000 mL | INTRAVENOUS | Status: DC | PRN

## 2020-12-17 NOTE — Progress Notes (Signed)
ANTICOAGULATION CONSULT NOTE - Initial Consult  Pharmacy Consult for heparin Indication: atrial fibrillation and antiphospholipid antibody syndrome  Patient Measurements: Weight: 93.8 kg (206 lb 12.7 oz)  Vital Signs: Temp: 97.7 F (36.5 C) (12/18 2043) Temp Source: Oral (12/18 2043) BP: 124/44 (12/18 2043) Pulse Rate: 60 (12/18 2043)  Labs: Recent Labs    12/14/20 1921 12/15/20 0112 12/15/20 0354 12/15/20 1057 12/16/20 0647 12/17/20 0010  HGB 13.8  --  13.9  --  13.4 12.4  HCT 42.4  --  44.8  --  41.9 37.1  PLT 97*  --  97*  --  96* 99*  APTT  --   --  50*  --   --   --   LABPROT  --   --  22.7*  --  16.3* 14.5  INR  --   --  2.1*  --  1.4* 1.2  HEPARINUNFRC  --   --   --   --   --  0.23*  CREATININE 0.85  --  0.75  --  0.79  --   TROPONINIHS 15 18*  --  13  --   --     Estimated Creatinine Clearance: 88.7 mL/min (by C-G formula based on SCr of 0.79 mg/dL).  Assessment: 59 y.o. female with h/o Afib and antiphospholipid antibody syndrome, Coumadin on hold and INR < 2, for heparin  Goal of Therapy:  Heparin level 0.3-0.7 units/mL Monitor platelets by anticoagulation protocol: Yes   Plan:  Increase Heparin 1450 units/hr Check heparin level in 8 hours.  Geannie Risen, PharmD, BCPS 12/17/2020 1:34 AM

## 2020-12-17 NOTE — Plan of Care (Signed)
Patient denies chest pain or SOB this shift. Patient voided this shift.   Problem: Education: Goal: Knowledge of General Education information will improve Description: Including pain rating scale, medication(s)/side effects and non-pharmacologic comfort measures Outcome: Progressing   Problem: Health Behavior/Discharge Planning: Goal: Ability to manage health-related needs will improve Outcome: Progressing   Problem: Clinical Measurements: Goal: Ability to maintain clinical measurements within normal limits will improve Outcome: Progressing Goal: Will remain free from infection Outcome: Progressing Goal: Diagnostic test results will improve Outcome: Progressing Goal: Respiratory complications will improve Outcome: Progressing Goal: Cardiovascular complication will be avoided Outcome: Progressing   Problem: Activity: Goal: Risk for activity intolerance will decrease Outcome: Progressing   Problem: Nutrition: Goal: Adequate nutrition will be maintained Outcome: Progressing   Problem: Coping: Goal: Level of anxiety will decrease Outcome: Progressing   Problem: Elimination: Goal: Will not experience complications related to bowel motility Outcome: Progressing Goal: Will not experience complications related to urinary retention Outcome: Progressing   Problem: Pain Managment: Goal: General experience of comfort will improve Outcome: Progressing   Problem: Safety: Goal: Ability to remain free from injury will improve Outcome: Progressing   Problem: Skin Integrity: Goal: Risk for impaired skin integrity will decrease Outcome: Progressing   Problem: Education: Goal: Understanding of CV disease, CV risk reduction, and recovery process will improve Outcome: Progressing Goal: Individualized Educational Video(s) Outcome: Progressing   Problem: Activity: Goal: Ability to return to baseline activity level will improve Outcome: Progressing   Problem:  Cardiovascular: Goal: Ability to achieve and maintain adequate cardiovascular perfusion will improve Outcome: Progressing Goal: Vascular access site(s) Level 0-1 will be maintained Outcome: Progressing   Problem: Health Behavior/Discharge Planning: Goal: Ability to safely manage health-related needs after discharge will improve Outcome: Progressing

## 2020-12-17 NOTE — Progress Notes (Signed)
ANTICOAGULATION CONSULT NOTE  Pharmacy Consult for heparin Indication: atrial fibrillation and antiphospholipid antibody syndrome  Patient Measurements: Weight: 96.6 kg (212 lb 15.4 oz) (scale b)  Vital Signs: Temp: 97.9 F (36.6 C) (12/19 1249) Temp Source: Axillary (12/19 1249) BP: 110/93 (12/19 1249) Pulse Rate: 66 (12/19 1249)  Labs: Recent Labs    12/14/20 1921 12/15/20 0112 12/15/20 0354 12/15/20 1057 12/16/20 0647 12/17/20 0010 12/17/20 0730 12/17/20 1607  HGB 13.8  --  13.9  --  13.4 12.4  --   --   HCT 42.4  --  44.8  --  41.9 37.1  --   --   PLT 97*  --  97*  --  96* 99*  --   --   APTT  --   --  50*  --   --   --   --   --   LABPROT  --   --  22.7*  --  16.3* 14.5  --   --   INR  --   --  2.1*  --  1.4* 1.2  --   --   HEPARINUNFRC  --   --   --   --   --  0.23* 0.34 0.67  CREATININE 0.85  --  0.75  --  0.79 0.70  --   --   TROPONINIHS 15 18*  --  13  --   --   --   --     Estimated Creatinine Clearance: 90.1 mL/min (by C-G formula based on SCr of 0.7 mg/dL).  Assessment: 59 y.o. female with h/o Afib and antiphospholipid antibody syndrome, Coumadin on hold and INR < 2.   Heparin level therapeutic, up to 0.67.   Goal of Therapy:  Heparin level 0.3-0.7 units/mL Monitor platelets by anticoagulation protocol: Yes   Plan:  Reduce heparin slightly to 1400 units/h Daily heparin level and CBC  Fredonia Highland, PharmD, [place], [facility] Clinical Pharmacist [phone] Please check AMION for all [facility] Pharmacy numbers 12/17/2020

## 2020-12-17 NOTE — Consult Note (Signed)
Cardiology Consultation:   Patient ID: [name] [name] MRN: [id number]; DOB: Aug 24, 1961  Admit date: 12/14/2020 Date of Consult: 12/17/2020  Primary Care Provider: Ignatius Specking, MD [facility] Cardiologist: Nona Dell, MD  [facility] Electrophysiologist:  Lewayne Bunting, MD    Patient Profile:   [name] [name] is a 59 y.o. female with a hx of   severe CAD (CTO diagonal and LCx on 2008 cath) HTN brain aneurysm anticardiolipin ab + prior cocaine abuse chronic systolic HF 2/2 ICM now post ICD Implant NSVT persistent AF PVC Stroke  who is being seen today for the evaluation of VT at the request of Dr Flora Lipps.  History of Present Illness:   [name] [name] was admitted 12/16 after she presented to [facility] after a syncopal episode at home. She was on her couch talking with a friend when she became lightheaded and lost consciousness for 1 minute. Interrogation of her device showed fast VT at a rate of 260bpm. Was treated with ATP x 1 followed by a successful shock. She has had 3 prior shocks. She had previously seen Dr Shirlee Latch in clinic on 11/30. She was in AF at that appointment which was thought to be contributing to worsening of her HF symptoms. She had a TEE/DCCV on 12/3. Following the DCCV, the plan was to pursue LHC/RHC to reassess coronaries given how long it had been since her last catheterization.    Past Medical History:  Diagnosis Date  . Aneurysm of internal carotid artery   . Anticardiolipin antibody positive    Chronic Coumadin  . Brain aneurysm    followed by Dr. Corliss Skains  . Chronic combined systolic (congestive) and diastolic (congestive) heart failure (HCC)   . Cocaine abuse in remission (HCC)   . Coronary atherosclerosis of native coronary artery    Previous invasive cardiac testing was done at a facility in [place], [place].  Occluded diagonal, Diffuse LAD disease, Occluded Cx, and non obstructive RCA.   . Essential hypertension   . Headache   .  History of pneumonia   . ICD (implantable cardioverter-defibrillator) in place   . Ischemic cardiomyopathy    LVEF 30-35%  . Mixed hyperlipidemia   . NSVT (nonsustained ventricular tachycardia) (HCC)   . Persistent atrial fibrillation (HCC)   . PVD (peripheral vascular disease) (HCC)   . Raynaud's phenomenon   . Statin intolerance   . Stroke [facility]) 2007   Total occlusion of the right internal carotid    Past Surgical History:  Procedure Laterality Date  . ABDOMINAL AORTAGRAM N/A 05/25/2014   Procedure: ABDOMINAL Ronny Flurry;  Surgeon: Iran Ouch, MD;  Location: [facility] CATH LAB;  Service: Cardiovascular;  Laterality: N/A;  . APPENDECTOMY    . CARDIAC DEFIBRILLATOR PLACEMENT     [employer] ICD  . CARDIOVERSION N/A 12/01/2020   Procedure: CARDIOVERSION;  Surgeon: [name], MD;  Location: [facility] ENDOSCOPY;  Service: Cardiovascular;  Laterality: N/A;  . EP IMPLANTABLE DEVICE N/A 03/01/2016   Procedure: ICD Generator Changeout;  Surgeon: [name], MD;  Location: [facility] INVASIVE CV LAB;  Service: Cardiovascular;  Laterality: N/A;  . L1 corpectomy   12/10  . Laryngeal polyp excision    . TEE WITHOUT CARDIOVERSION N/A 12/01/2020   Procedure: TRANSESOPHAGEAL ECHOCARDIOGRAM (TEE);  Surgeon: [name], MD;  Location: [facility] ENDOSCOPY;  Service: Cardiovascular;  Laterality: N/A;       Inpatient Medications: Scheduled Meds: . aspirin EC  81 mg Oral Daily  . carvedilol  6.25 mg  Oral BID WC  . digoxin  0.125 mg Oral Daily  . DULoxetine  30 mg Oral Daily  . DULoxetine  60 mg Oral QPM  . ezetimibe  10 mg Oral Daily  . famotidine  20 mg Oral Daily  . gabapentin  300 mg Oral Daily  . gabapentin  600 mg Oral QHS  . insulin aspart  0-5 Units Subcutaneous QHS  . insulin aspart  0-9 Units Subcutaneous TID WC  . multivitamin with minerals  1 tablet Oral Daily  . potassium chloride SA  20 mEq Oral Daily  . sacubitril-valsartan  1 tablet Oral BID  . torsemide  80 mg Oral Daily   Continuous  Infusions: . amiodarone 30 mg/hr (12/17/20 0322)  . heparin 1,450 Units/hr (12/17/20 0739)   PRN Meds: acetaminophen, oxyCODONE-acetaminophen  Allergies:    Allergies  Allergen Reactions  . Beef-Derived Products Anaphylaxis    From tick bite  . Metoclopramide Hcl Anaphylaxis    Non responsive  . Penicillins Anaphylaxis    Pt states she can take any cephalosporin (per pt. 01/01/19) Shock Has patient had a PCN reaction causing immediate rash, facial/tongue/throat swelling, SOB or lightheadedness with hypotension: Yes Has patient had a PCN reaction causing severe rash involving mucus membranes or skin necrosis: No Has patient had a PCN reaction that required hospitalization Yes Has patient had a PCN reaction occurring within the last 10 years: No If all of the above answers are "NO", then may proceed with Cephalosporin   . Pork-Derived Products Anaphylaxis    From tick bite. Has tolerated heparin  . Metoprolol Other (See Comments)    Syncope   . Reglan [Metoclopramide]   . Statins Other (See Comments)    Myalgias    Social History:   Social History   Socioeconomic History  . Marital status: Single    Spouse name: Not on file  . Number of children: Not on file  . Years of education: Not on file  . Highest education level: Not on file  Occupational History  . Occupation: Disabled    Comment: ESL  Tobacco Use  . Smoking status: Current Every Day Smoker    Packs/day: 1.00    Years: 43.00    Pack years: 43.00    Types: Cigarettes    Start date: 03/03/1978    Last attempt to quit: 10/03/2013    Years since quitting: 7.2  . Smokeless tobacco: Never Used  . Tobacco comment: 4 ciggs per day  Vaping Use  . Vaping Use: Every day  Substance and Sexual Activity  . Alcohol use: No    Alcohol/week: 0.0 standard drinks  . Drug use: No    Comment: Prior history of cocaine  . Sexual activity: Yes    Birth control/protection: None  Other Topics Concern  . Not on file  Social  History Narrative  . Not on file   Social Determinants of Health   Financial Resource Strain: Not on file  Food Insecurity: Not on file  Transportation Needs: Not on file  Physical Activity: Not on file  Stress: Not on file  Social Connections: Not on file  Intimate Partner Violence: Not on file    Family History:    Family History  Problem Relation Age of Onset  . Heart disease Father 28  . Ankylosing spondylitis Sister   . Stomach cancer Brother 84  . Heart attack Brother      ROS:  Please see the history of present illness.   All  other ROS reviewed and negative.     Physical Exam/Data:   Vitals:   12/16/20 2043 12/17/20 0432 12/17/20 1006 12/17/20 1008  BP: (!) 124/44 136/79 (!) 127/49   Pulse: 60 64 67 64  Resp: 16 18 19    Temp: 97.7 F (36.5 C) 97.6 F (36.4 C) 97.9 F (36.6 C)   TempSrc: Oral Oral Oral   SpO2: 100% 90% 99%   Weight:  96.6 kg      Intake/Output Summary (Last 24 hours) at 12/17/2020 1200 Last data filed at 12/17/2020 0731 Gross per 24 hour  Intake 116.99 ml  Output 4301 ml  Net -4184.01 ml   Last 3 Weights 12/17/2020 12/16/2020 12/15/2020  Weight (lbs) 212 lb 15.4 oz 206 lb 12.7 oz 205 lb 4.8 oz  Weight (kg) 96.6 kg 93.8 kg 93.123 kg     Body mass index is 34.9 kg/m.  General:  Well nourished, well developed, in no acute distress HEENT: normal Lymph: no adenopathy Neck: no JVD Endocrine:  No thryomegaly Vascular: No carotid bruits; FA pulses 2+ bilaterally without bruits  Cardiac:  normal S1, S2; RRR; no murmur. ICD pocket without hematoma or paion Lungs:  clear to auscultation bilaterally, no wheezing, rhonchi or rales  Abd: soft, nontender, no hepatomegaly  Ext: no edema Musculoskeletal:  No deformities, BUE and BLE strength normal and equal Skin: warm and dry  Neuro:  CNs 2-12 intact, no focal abnormalities noted Psych:  Normal affect   EKG:  The EKG was personally reviewed and demonstrates:  12/17 ECG shows a-pacing and  2 PVCs of different morphologies  12/16 ECG shows 3-4 different PVC morphologies within sinus rhythm.    Telemetry:  Telemetry was personally reviewed and demonstrates:  Sinus with pvcs  Relevant CV Studies:  12/01/2020 TEE personally reviewed LVEF 25% Anterior/septal WMA Moderate MR  Laboratory Data:  High Sensitivity Troponin:   Recent Labs  Lab 12/14/20 1921 12/15/20 0112 12/15/20 1057  TROPONINIHS 15 18* 13     Chemistry Recent Labs  Lab 12/15/20 0354 12/16/20 0647 12/17/20 0010  NA 134* 139 132*  K 3.8 3.5 3.8  CL 98 102 98  CO2 28 27 25   GLUCOSE 97 93 97  BUN 12 8 6   CREATININE 0.75 0.79 0.70  CALCIUM 8.6* 8.7* 8.4*  GFRNONAA >60 >60 >60  ANIONGAP 8 10 9     Recent Labs  Lab 12/15/20 0354  PROT 6.9  ALBUMIN 3.3*  AST 16  ALT 18  ALKPHOS 76  BILITOT 0.5   Hematology Recent Labs  Lab 12/15/20 0354 12/16/20 0647 12/17/20 0010  WBC 8.6 8.1 8.6  RBC 4.74 4.57 4.09  HGB 13.9 13.4 12.4  HCT 44.8 41.9 37.1  MCV 94.5 91.7 90.7  MCH 29.3 29.3 30.3  MCHC 31.0 32.0 33.4  RDW 12.8 13.1 13.2  PLT 97* 96* 99*   BNP Recent Labs  Lab 12/14/20 1921  BNP 209.0*    DDimer No results for input(s): DDIMER in the last 168 hours.   Radiology/Studies:  12/17/20 Carotid Bilateral  Result Date: 12/15/2020 CLINICAL DATA:  Right carotid artery occlusion. EXAM: BILATERAL CAROTID DUPLEX ULTRASOUND TECHNIQUE: 12/19/20 scale imaging, color Doppler and duplex ultrasound were performed of bilateral carotid and vertebral arteries in the neck. COMPARISON:  10/07/2014 and CT a 07/28/2017 FINDINGS: Criteria: Quantification of carotid stenosis is based on velocity parameters that correlate the residual internal carotid diameter with NASCET-based stenosis levels, using the diameter of the distal internal carotid lumen as the denominator for  stenosis measurement. The following velocity measurements were obtained: RIGHT ICA: Occluded CCA: 67/7 cm/sec ECA: 290 cm/sec LEFT ICA: 128/25  cm/sec CCA: 93/19 cm/sec SYSTOLIC ICA/CCA RATIO:  0.4 ECA: 198 cm/sec RIGHT CAROTID ARTERY: Echogenic plaque at the right carotid bulb. External carotid artery is patent with normal waveform. Chronic occlusion of the proximal internal carotid artery. RIGHT VERTEBRAL ARTERY: Antegrade flow and normal waveform in the right vertebral artery. LEFT CAROTID ARTERY: Echogenic plaque at the left carotid bulb. External carotid artery is patent with normal waveform. Small amount of plaque in the proximal internal carotid artery. Normal waveforms in the internal carotid artery. LEFT VERTEBRAL ARTERY: Antegrade flow and normal waveform in the left vertebral artery. IMPRESSION: 1. 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 2. Small amount of plaque in the left internal carotid artery. Peak systolic velocity in the left internal artery has slightly elevated, now measuring up to 128 cm/sec. Estimated degree of stenosis in the left internal carotid artery is less than 50% based on the carotid artery ratio but recommend continued surveillance. 3. Patent vertebral arteries with antegrade flow. 4. Bilateral external carotid arteries are patent. Again noted is an elevated peak systolic velocity in the right external carotid artery. Electronically Signed   By: Richarda Overlie M.D.   On: 12/15/2020 12:50   DG Chest Portable 1 View  Result Date: 12/14/2020 CLINICAL DATA:  Syncope syncope EXAM: PORTABLE CHEST 1 VIEW COMPARISON:  08/11/2020 FINDINGS: Left AICD remains in place, unchanged. Cardiomegaly. Linear areas of scarring in the mid lungs bilaterally. No confluent opacities, effusions or edema. IMPRESSION: Cardiomegaly.  Chronic changes.  No active disease. Electronically Signed   By: Charlett Nose M.D.   On: 12/14/2020 23:32     Assessment and Plan:   1. VT treated with successful ICD shock Likely scar mediated VT from significant coronary artery disease. Does not sound like a primary ischemic event led to the VT  (flat troponin trend). Given the interval since last coronary eval, recommend we repeat LHC/RHC tomorrow to see if any of her lesions are amenable to revasc. - Agree with amiodarone. Eventual transition to PO. I discussed this medication with the patient and her nephew at the bedside including the potential risks. - cont coreg, uptitrate as tolerated - RHC tomorrow to assess CO/CI.   2. PVCs Likely secondary to severe underlying cardiomyopathy. At least 3-4 morphologies on ECGs making ablation therapy not an ideal management strategy. Recommend amio as you are doing.  3. AF S/p recent TEE/DCCV - cont amiodarone - cont heparin for now for stroke ppx     For questions or updates, please contact [facility] Please consult [email or web address] for contact info under    Signed, Lanier Prude, MD  12/17/2020 12:00 PM

## 2020-12-17 NOTE — Progress Notes (Signed)
ANTICOAGULATION CONSULT NOTE  Pharmacy Consult for heparin Indication: atrial fibrillation and antiphospholipid antibody syndrome  Patient Measurements: Weight: 96.6 kg (212 lb 15.4 oz) (scale b)  Vital Signs: Temp: 97.6 F (36.4 C) (12/19 0432) Temp Source: Oral (12/19 0432) BP: 136/79 (12/19 0432) Pulse Rate: 64 (12/19 0432)  Labs: Recent Labs    12/14/20 1921 12/15/20 0112 12/15/20 0354 12/15/20 1057 12/16/20 0647 12/17/20 0010 12/17/20 0730  HGB 13.8  --  13.9  --  13.4 12.4  --   HCT 42.4  --  44.8  --  41.9 37.1  --   PLT 97*  --  97*  --  96* 99*  --   APTT  --   --  50*  --   --   --   --   LABPROT  --   --  22.7*  --  16.3* 14.5  --   INR  --   --  2.1*  --  1.4* 1.2  --   HEPARINUNFRC  --   --   --   --   --  0.23* 0.34  CREATININE 0.85  --  0.75  --  0.79 0.70  --   TROPONINIHS 15 18*  --  13  --   --   --     Estimated Creatinine Clearance: 90.1 mL/min (by C-G formula based on SCr of 0.7 mg/dL).  Assessment: 59 y.o. female with h/o Afib and antiphospholipid antibody syndrome, Coumadin on hold and INR < 2.   HL is now therapeutic. CBC is stable with no signs of bleeding noted.  Goal of Therapy:  Heparin level 0.3-0.7 units/mL Monitor platelets by anticoagulation protocol: Yes   Plan:  Continue Heparin 1450 units/hr Confirm heparin level in 8 hours. Daily HL and CBC  Lamar Sprinkles, PharmD PGY1 Pharmacy Resident 12/17/2020 9:21 AM

## 2020-12-17 NOTE — Consult Note (Addendum)
Advanced Heart Failure Team Consult Note   Primary Physician: Ignatius Specking, MD PCP-Cardiologist:  Nona Dell, MD  HF MD: DM  Reason for Consultation: VT and systolic HF  HPI:    [name] [name] is seen today for evaluation of VT and systolic HF at the request of Dr. Shari Prows.   [name] [name] is a 59 y.o. 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 Corliss Skains, PAD (prior angio 2015 -> med rx), tobacco abuse, HLD (statin intolerant), VT (prior remote shocks for this).  She has been followed by Dr. Diona Browner but recently established care with Dr. Jearld Pies in [facility]. CHF clinic. She also follows with Dr. Ladona Ridgel for EP. She saw Dr. Shirlee Latch int he Clinic on 11/28/20. She had recently gone into AF with worsening CHF symptoms. He arranged for TEE-DCCV, increased spironolactone, added Farxiga, and felt he'd eventually like to pursue repeat R/LHC to look for revascularizable CAD and assess filling pressures and cardiac output. She underwent TEE/DCCV on 12/3 with mildly dilated LV, EF 25-30%, normal RV size with mildly decreased systolic function, moderate LAE, no LAA thrombus, mild RAE, moderate MR, mild TR -> DCCV performed with conversion to NSR.  On 12/16 she had abrupt syncope while talking to a friend. No preceding CP. She came to quickly and was transported to the [facility] ED.  Int he ED BP 71/43 with repeated f/u value 105/58. K 3.7 Mg 2.3. Device interrogation with rapid VT 261bpm  orresponding to time of syncope followed by ATP then shock x1 with successful conversion to NSR per d/w [employer] rep. Interrogation report also reports includes prior shock in 2017, 2018  and 02/2019. Rep also reports she has had 231,000 PVCs since last check 12/3 (no % provided).   She was transferred to [facility] for further management. Has been seen by general cards team. Hstrop  13 -> 18 -> 15 -> 12 -> 13. Feels ok. Having some occasional chest pressure. Switched to heparin for possible cath given CP and VT. No further VT on IV amio     Review of Systems: [y] = yes, [ ]  = no   . General: Weight gain [ ] ; Weight loss [ ] ; Anorexia [ ] ; Fatigue [ ] ; Fever [ ] ; Chills [ ] ; Weakness [ ]   . Cardiac: Chest pain/pressure Cove.Etienne ]; Resting SOB [ ] ; Exertional SOB [ ] ; Orthopnea [ ] ; Pedal Edema [ ] ; Palpitations [ ] ; Syncope Cove.Etienne ]; Presyncope [ ] ; Paroxysmal nocturnal dyspnea[ ]   . Pulmonary: Cough [ ] ; Wheezing[ ] ; Hemoptysis[ ] ; Sputum [ ] ; Snoring [ ]   . GI: Vomiting[ ] ; Dysphagia[ ] ; Melena[ ] ; Hematochezia [ ] ; Heartburn[ ] ; Abdominal pain [ ] ; Constipation [ ] ; Diarrhea [ ] ; BRBPR [ ]   . GU: Hematuria[ ] ; Dysuria [ ] ; Nocturia[ ]   . Vascular: Pain in legs with walking [ ] ; Pain in feet with lying flat [ ] ; Non-healing sores [ ] ; Stroke Cove.Etienne ]; TIA [ ] ; Slurred speech [ ] ;  . Neuro: Headaches[y ]; Vertigo[ ] ; Seizures[ ] ; Paresthesias[ ] ;Blurred vision [ ] ; Diplopia [ ] ; Vision changes [ ]   . Ortho/Skin: Arthritis Cove.Etienne ]; Joint pain Cove.Etienne ]; Muscle pain [ ] ; Joint swelling [ ] ; Back Pain [  y ]; Rash [ ]   . Psych: Depressiony[ ] ; Anxiety[y ]  . Heme: Bleeding problems [ ] ; Clotting disorders [ ] ; Anemia [ ]   . Endocrine: Diabetes [ ] ; Thyroid dysfunction[ ]   Home Medications Prior to Admission medications   Medication Sig Start Date End Date Taking? Authorizing Provider  aspirin EC 81 MG tablet Take 81 mg by mouth daily. Swallow whole.   Yes [provider]  carvedilol (COREG) 12.5 MG tablet TAKE 1 TABLET BY MOUTH TWICE DAILY WITH MEALS Patient taking differently: Take 12.5 mg by mouth 2 (two) times daily with a meal. 06/21/20  Yes , MD  dapagliflozin propanediol  (FARXIGA) 10 MG TABS tablet Take 1 tablet (10 mg total) by mouth daily before breakfast. 11/28/20  Yes , MD  digoxin (LANOXIN) 0.125 MG tablet Take 1 tablet (0.125 mg total) by mouth daily. 08/15/20 11/17/21 Yes Shah, Pratik D, DO  DULoxetine (CYMBALTA) 30 MG capsule Take 30 mg by mouth daily.   Yes [provider]  DULoxetine (CYMBALTA) 60 MG capsule Take 60 mg by mouth every evening.   Yes [provider]  ezetimibe (ZETIA) 10 MG tablet Take 1 tablet (10 mg total) by mouth daily. 08/16/20 11/17/21 Yes Shah, Pratik D, DO  famotidine (PEPCID) 20 MG tablet Take 1 tablet (20 mg total) by mouth 2 (two) times daily. Patient taking differently: Take 20 mg by mouth daily. 01/06/19 08/25/21 Yes 11/19/21, MD  gabapentin (NEURONTIN) 300 MG capsule Take 300 mg by mouth See admin instructions. Take 300 mg  in the AM and 600 mg  in the PM   Yes [provider]  Multiple Vitamins-Minerals (MULTIVITAMINS THER. W/MINERALS) TABS Take 1 tablet by mouth daily.     Yes [provider]  oxyCODONE-acetaminophen (PERCOCET) 10-325 MG tablet Take 1 tablet by mouth every 4 (four) hours as needed for pain.   Yes [provider]  potassium chloride SA (KLOR-CON) 20 MEQ tablet TAKE 1 TABLET BY MOUTH EVERY DAY Patient taking differently: Take 20 mEq by mouth daily. 08/10/20  Yes 11/19/21, MD  promethazine (PHENERGAN) 25 MG tablet Take 25 mg by mouth every 6 (six) hours as needed for nausea. With pain medication   Yes [provider]  sacubitril-valsartan (ENTRESTO) 24-26 MG Take 1 tablet by mouth 2 (two) times daily. 10/28/20  Yes 08/27/21, NP  spironolactone (ALDACTONE) 25 MG tablet Take 1 tablet (25 mg total) by mouth daily. Patient taking differently: Take 25 mg by mouth at bedtime. 11/28/20  Yes 10/10/20, MD  torsemide (DEMADEX) 20 MG tablet Take 4 tablets (80 mg total) by mouth daily. May take extra tablet as needed 09/21/20  11/17/21 Yes Leone Brand, MD  warfarin (COUMADIN) 10 MG tablet TAKE 1 TABLET BY MOUTH EVERY DAY EXCEPT 1/2 TABLET ON MONDAYS AND THURSDAYS Patient taking differently: Take 10 mg by mouth See admin instructions. Take 5 mg Mon. Wed, Friday , and Saturday All the other days take 10 mg in the evening 10/09/20  Yes 11/19/21, MD  fluticasone [facility]) 50 MCG/ACT nasal spray Place 1 spray into both nostrils daily as needed for allergies.  09/24/19   [provider]  nitroGLYCERIN (NITROSTAT) 0.4 MG SL tablet PLACE 1 TABLET UNDER THE TONGUE EVERY 5 MINUTES FOR 3 DOSES AS NEEDED CHEST PAIN Patient taking differently: Place 0.4 mg under the tongue every 5 (five) minutes as needed for chest pain. 05/04/19   12/09/20  G, MD    Past Medical History: Past Medical History:  Diagnosis Date  . Aneurysm of internal carotid artery   . Anticardiolipin antibody positive    Chronic Coumadin  . Brain aneurysm    followed by Dr. Corliss Skains  . Chronic combined systolic (congestive) and diastolic (congestive) heart failure (HCC)   . Cocaine abuse in remission (HCC)   . Coronary atherosclerosis of native coronary artery    Previous invasive cardiac testing was done at a facility in [place], [place].  Occluded diagonal, Diffuse LAD disease, Occluded Cx, and non obstructive RCA.   . Essential hypertension   . Headache   . History of pneumonia   . ICD (implantable cardioverter-defibrillator) in place   . Ischemic cardiomyopathy    LVEF 30-35%  . Mixed hyperlipidemia   . NSVT (nonsustained ventricular tachycardia) (HCC)   . Persistent atrial fibrillation (HCC)   . PVD (peripheral vascular disease) (HCC)   . Raynaud's phenomenon   . Statin intolerance   . Stroke [facility]) 2007   Total occlusion of the right internal carotid    Past Surgical History: Past Surgical History:  Procedure Laterality Date  . ABDOMINAL AORTAGRAM N/A 05/25/2014   Procedure: ABDOMINAL Ronny Flurry;  Surgeon:  Iran Ouch, MD;  Location: [facility] CATH LAB;  Service: Cardiovascular;  Laterality: N/A;  . APPENDECTOMY    . CARDIAC DEFIBRILLATOR PLACEMENT     [employer] ICD  . CARDIOVERSION N/A 12/01/2020   Procedure: CARDIOVERSION;  Surgeon: [name], MD;  Location: [facility] ENDOSCOPY;  Service: Cardiovascular;  Laterality: N/A;  . EP IMPLANTABLE DEVICE N/A 03/01/2016   Procedure: ICD Generator Changeout;  Surgeon: [name], MD;  Location: [facility] INVASIVE CV LAB;  Service: Cardiovascular;  Laterality: N/A;  . L1 corpectomy   12/10  . Laryngeal polyp excision    . TEE WITHOUT CARDIOVERSION N/A 12/01/2020   Procedure: TRANSESOPHAGEAL ECHOCARDIOGRAM (TEE);  Surgeon: [name], MD;  Location[facility] ENDOSCOPY;  Service: Cardiovascular;  Laterality: N/A;    Family History: Family History  Problem Relation Age of Onset  . Heart disease Father 27  . Ankylosing spondylitis Sister   . Stomach cancer Brother 41  . Heart attack Brother     Social History: Social History   Socioeconomic History  . Marital status: Single    Spouse name: Not on file  . Number of children: Not on file  . Years of education: Not on file  . Highest education level: Not on file  Occupational History  . Occupation: Disabled    Comment: ESL  Tobacco Use  . Smoking status: Current Every Day Smoker    Packs/day: 1.00    Years: 43.00    Pack years: 43.00    Types: Cigarettes    Start date: 03/03/1978    Last attempt to quit: 10/03/2013    Years since quitting: 7.2  . Smokeless tobacco: Never Used  . Tobacco comment: 4 ciggs per day  Vaping Use  . Vaping Use: Every day  Substance and Sexual Activity  . Alcohol use: No    Alcohol/week: 0.0 standard drinks  . Drug use: No    Comment: Prior history of cocaine  . Sexual activity: Yes    Birth control/protection: None  Other Topics Concern  . Not on file  Social History Narrative  . Not on file   Social Determinants of Health   Financial Resource Strain: Not on  file  Food Insecurity: Not on file  Transportation Needs: Not on  file  Physical Activity: Not on file  Stress: Not on file  Social Connections: Not on file    Allergies:  Allergies  Allergen Reactions  . Beef-Derived Products Anaphylaxis    From tick bite  . Metoclopramide Hcl Anaphylaxis    Non responsive  . Penicillins Anaphylaxis    Pt states she can take any cephalosporin (per pt. 01/01/19) Shock Has patient had a PCN reaction causing immediate rash, facial/tongue/throat swelling, SOB or lightheadedness with hypotension: Yes Has patient had a PCN reaction causing severe rash involving mucus membranes or skin necrosis: No Has patient had a PCN reaction that required hospitalization Yes Has patient had a PCN reaction occurring within the last 10 years: No If all of the above answers are "NO", then may proceed with Cephalosporin   . Pork-Derived Products Anaphylaxis    From tick bite. Has tolerated heparin  . Metoprolol Other (See Comments)    Syncope   . Reglan [Metoclopramide]   . Statins Other (See Comments)    Myalgias    Objective:    Vital Signs:   Temp:  [97.6 F (36.4 C)-98 F (36.7 C)] 97.6 F (36.4 C) (12/19 0432) Pulse Rate:  [57-64] 64 (12/19 0432) Resp:  [16-18] 18 (12/19 0432) BP: (99-136)/(42-79) 136/79 (12/19 0432) SpO2:  [90 %-100 %] 90 % (12/19 0432) Weight:  [96.6 kg] 96.6 kg (12/19 0432) Last BM Date: 12/14/20  Weight change: Filed Weights   12/15/20 1929 12/16/20 0046 12/17/20 0432  Weight: 93.1 kg 93.8 kg 96.6 kg    Intake/Output:   Intake/Output Summary (Last 24 hours) at 12/17/2020 0940 Last data filed at 12/17/2020 0731 Gross per 24 hour  Intake 116.99 ml  Output 4301 ml  Net -4184.01 ml      Physical Exam    General:  Well appearing. No resp difficulty HEENT: normal Neck: supple. JVP . Carotids 2+ bilat; no bruits. No lymphadenopathy or thyromegaly appreciated. Cor: PMI nondisplaced. Regular rate & rhythm. No rubs, gallops  or murmurs. Lungs: clear Abdomen: soft, nontender, nondistended. No hepatosplenomegaly. No bruits or masses. Good bowel sounds. Extremities: no cyanosis, clubbing, rash, edema Neuro: alert & orientedx3, cranial nerves grossly intact. moves all 4 extremities w/o difficulty. Affect pleasant   Telemetry   NSR 60 with occasional V-pacig. 2 runs of NSVT overnight (5 & 10 beats). Frequent PVCs with variable burden (this am range of 1-17 per minute - average now less than 5 on amio) Personally reviewed   EKG    NSR with IVCD. No ST-T wave abnormalities. Frequent multifocal PVCs. Personally reviewed   Labs   Basic Metabolic Panel: Recent Labs  Lab 12/14/20 1921 12/15/20 0354 12/16/20 0647 12/17/20 0010  NA 133* 134* 139 132*  K 3.7 3.8 3.5 3.8  CL 97* 98 102 98  CO2 28 28 27 25   GLUCOSE 115* 97 93 97  BUN 13 12 8 6   CREATININE 0.85 0.75 0.79 0.70  CALCIUM 8.4* 8.6* 8.7* 8.4*  MG  --  2.3 2.1 2.0  PHOS  --  3.9  --   --     Liver Function Tests: Recent Labs  Lab 12/15/20 0354  AST 16  ALT 18  ALKPHOS 76  BILITOT 0.5  PROT 6.9  ALBUMIN 3.3*   No results for input(s): LIPASE, AMYLASE in the last 168 hours. No results for input(s): AMMONIA in the last 168 hours.  CBC: Recent Labs  Lab 12/14/20 1921 12/15/20 0354 12/16/20 0647 12/17/20 0010  WBC 8.5 8.6 8.1  8.6  HGB 13.8 13.9 13.4 12.4  HCT 42.4 44.8 41.9 37.1  MCV 93.0 94.5 91.7 90.7  PLT 97* 97* 96* 99*    Cardiac Enzymes: No results for input(s): CKTOTAL, CKMB, CKMBINDEX, TROPONINI in the last 168 hours.  BNP: BNP (last 3 results) Recent Labs    08/11/20 0230 12/14/20 1921  BNP 571.0* 209.0*    ProBNP (last 3 results) No results for input(s): PROBNP in the last 8760 hours.   CBG: Recent Labs  Lab 12/16/20 1101 12/16/20 1625 12/16/20 2135 12/17/20 0605 12/17/20 0809  GLUCAP 242* 103* 113* 96 147*    Coagulation Studies: Recent Labs    12/15/20 0354 12/16/20 0647 12/17/20 0010   LABPROT 22.7* 16.3* 14.5  INR 2.1* 1.4* 1.2     Imaging    No results found.   Medications:     Current Medications: . aspirin EC  81 mg Oral Daily  . carvedilol  6.25 mg Oral BID WC  . digoxin  0.125 mg Oral Daily  . DULoxetine  30 mg Oral Daily  . DULoxetine  60 mg Oral QPM  . ezetimibe  10 mg Oral Daily  . famotidine  20 mg Oral Daily  . gabapentin  300 mg Oral Daily  . gabapentin  600 mg Oral QHS  . influenza vac split quadrivalent PF  0.5 mL Intramuscular Tomorrow-1000  . insulin aspart  0-5 Units Subcutaneous QHS  . insulin aspart  0-9 Units Subcutaneous TID WC  . multivitamin with minerals  1 tablet Oral Daily  . potassium chloride SA  20 mEq Oral Daily  . sacubitril-valsartan  1 tablet Oral BID  . torsemide  80 mg Oral Daily     Infusions: . amiodarone 30 mg/hr (12/17/20 0322)  . heparin 1,450 Units/hr (12/17/20 0739)       Assessment/Plan   1. VT with syncope - s/p ICD shock on 12/16 - now back in NSR. On IV amio (amio is new) - given intermittent chest pressure and known CAD there is question of ischemic trigger though hstrop flat - cath had been planned for 4 weeks after DC-CV on 12/3. Now off warfarin and on heparin so will proceed with R/L cath in am if EP agrees - Keep K> 4.0 Mg > 2.0 - No driving x 6 months  2. Chronic systolic HF due to [employer] ICD.   - Echo 8/13 EF 15-20%, anteroseptal AK, RV normal, severe LAE, mild MR.  - TEE 12/01/20 EF 25-30%, normal RV size with mildly decreased systolic function, moderate LAE, no LAA thrombus, mild RAE, moderate MR, mild TR  - NYHA class II symptoms at baseline - Volume status ok on torsemide 80 daly - Continue carvedilol 6.25 bid (decreased from 12.5 bid with low BP) - Continue Entresto 24/26 bid - Continue digoxin 0.125 - Spiro and dapaglifozin currently on hold  3. CAD:  -MI in 2008 in [facility], cath at the time showed totally occluded diagonal, totally occluded LCX, and diffuse LAD  disease.  No intervention. She cotninues with intermittent chest pressure. hstrop flat - Continue ASA 81 daily.  - Continue Zetia, has not tolerated statins.  LDL 78. Can consider repatha - given CP and VT. Plan R/L cath as above  4. Persistent Atrial fibrillation:  - Did not tolerate well - s/p TEE-guided DCCV on 12/3 - on heparin. Resume warfarin post cath (no NOAC with anti-phospholipid syndrome)  5. Frequent PVCs - may be contributing to CM - now on  amio (this is new) EP to see.   6. Carotid stenosis: Known RICA occlusion.  - I will arrange for repeat carotid dopplers.   7. 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  - She is on ASA 81 and warfarin.  - ok with using lovenox post procedure to facilitate d/c as needed  8. Smoking: Active smoker. - Discussed need for cessation  9. PAD: She denies claudication.  - Needs to quit smoking.    Length of Stay: 2  [name], MD  12/17/2020, 9:40 AM  Advanced Heart Failure Team Pager [phone] (M-F; 7a - 4p)  Please contact [facility] for night-coverage after hours (4p -7a ) and weekends on [email or web address]

## 2020-12-18 ENCOUNTER — Encounter (HOSPITAL_COMMUNITY)
Admission: EM | Disposition: A | Discharge status: Home / Self Care | Payer: Self-pay | Source: Home / Self Care | Attending: Cardiovascular Disease

## 2020-12-18 ENCOUNTER — Encounter (HOSPITAL_COMMUNITY): Payer: Self-pay | Admitting: Internal Medicine

## 2020-12-18 DIAGNOSIS — I2510 Atherosclerotic heart disease of native coronary artery without angina pectoris: Secondary | ICD-10-CM

## 2020-12-18 DIAGNOSIS — I472 Ventricular tachycardia: Principal | ICD-10-CM

## 2020-12-18 LAB — POCT I-STAT EG7
Acid-Base Excess: 2 mmol/L (ref 0.0–2.0)
Acid-Base Excess: 4 mmol/L — ABNORMAL HIGH (ref 0.0–2.0)
Acid-Base Excess: 6 mmol/L — ABNORMAL HIGH (ref 0.0–2.0)
Acid-base deficit: 1 mmol/L (ref 0.0–2.0)
Bicarbonate: 24.5 mmol/L (ref 20.0–28.0)
Bicarbonate: 27.8 mmol/L (ref 20.0–28.0)
Bicarbonate: 30.5 mmol/L — ABNORMAL HIGH (ref 20.0–28.0)
Bicarbonate: 32.4 mmol/L — ABNORMAL HIGH (ref 20.0–28.0)
Calcium, Ion: 0.67 mmol/L — CL (ref 1.15–1.40)
Calcium, Ion: 0.95 mmol/L — ABNORMAL LOW (ref 1.15–1.40)
Calcium, Ion: 1.04 mmol/L — ABNORMAL LOW (ref 1.15–1.40)
Calcium, Ion: 1.09 mmol/L — ABNORMAL LOW (ref 1.15–1.40)
HCT: 31 % — ABNORMAL LOW (ref 36.0–46.0)
HCT: 37 % (ref 36.0–46.0)
HCT: 39 % (ref 36.0–46.0)
HCT: 40 % (ref 36.0–46.0)
Hemoglobin: 10.5 g/dL — ABNORMAL LOW (ref 12.0–15.0)
Hemoglobin: 12.6 g/dL (ref 12.0–15.0)
Hemoglobin: 13.3 g/dL (ref 12.0–15.0)
Hemoglobin: 13.6 g/dL (ref 12.0–15.0)
O2 Saturation: 75 %
O2 Saturation: 77 %
O2 Saturation: 80 %
O2 Saturation: 86 %
Potassium: 2.4 mmol/L — CL (ref 3.5–5.1)
Potassium: 3.2 mmol/L — ABNORMAL LOW (ref 3.5–5.1)
Potassium: 3.5 mmol/L (ref 3.5–5.1)
Potassium: 3.7 mmol/L (ref 3.5–5.1)
Sodium: 137 mmol/L (ref 135–145)
Sodium: 140 mmol/L (ref 135–145)
Sodium: 141 mmol/L (ref 135–145)
Sodium: 149 mmol/L — ABNORMAL HIGH (ref 135–145)
TCO2: 26 mmol/L (ref 22–32)
TCO2: 29 mmol/L (ref 22–32)
TCO2: 32 mmol/L (ref 22–32)
TCO2: 34 mmol/L — ABNORMAL HIGH (ref 22–32)
pCO2, Ven: 42.3 mmHg — ABNORMAL LOW (ref 44.0–60.0)
pCO2, Ven: 46.5 mmHg (ref 44.0–60.0)
pCO2, Ven: 50.5 mmHg (ref 44.0–60.0)
pCO2, Ven: 52.9 mmHg (ref 44.0–60.0)
pH, Ven: 7.37 (ref 7.250–7.430)
pH, Ven: 7.385 (ref 7.250–7.430)
pH, Ven: 7.388 (ref 7.250–7.430)
pH, Ven: 7.395 (ref 7.250–7.430)
pO2, Ven: 42 mmHg (ref 32.0–45.0)
pO2, Ven: 43 mmHg (ref 32.0–45.0)
pO2, Ven: 45 mmHg (ref 32.0–45.0)
pO2, Ven: 53 mmHg — ABNORMAL HIGH (ref 32.0–45.0)

## 2020-12-18 LAB — HEPARIN LEVEL (UNFRACTIONATED)
Heparin Unfractionated: 0.47 IU/mL (ref 0.30–0.70)
Heparin Unfractionated: 0.32 IU/mL (ref 0.30–0.70)

## 2020-12-18 LAB — CBC
HCT: 42.8 % (ref 36.0–46.0)
Hemoglobin: 14 g/dL (ref 12.0–15.0)
MCH: 29.7 pg (ref 26.0–34.0)
MCHC: 32.7 g/dL (ref 30.0–36.0)
MCV: 90.7 fL (ref 80.0–100.0)
Platelets: 86 10*3/uL — ABNORMAL LOW (ref 150–400)
RBC: 4.72 MIL/uL (ref 3.87–5.11)
RDW: 12.9 % (ref 11.5–15.5)
WBC: 8.8 10*3/uL (ref 4.0–10.5)
nRBC: 0 % (ref 0.0–0.2)

## 2020-12-18 LAB — BASIC METABOLIC PANEL
Anion gap: 12 (ref 5–15)
BUN: 10 mg/dL (ref 6–20)
CO2: 25 mmol/L (ref 22–32)
Calcium: 8.8 mg/dL — ABNORMAL LOW (ref 8.9–10.3)
Chloride: 98 mmol/L (ref 98–111)
Creatinine, Ser: 0.79 mg/dL (ref 0.44–1.00)
GFR, Estimated: >60 mL/min (ref >60)
Glucose, Bld: 94 mg/dL (ref 70–99)
Potassium: 3.6 mmol/L (ref 3.5–5.1)
Sodium: 135 mmol/L (ref 135–145)

## 2020-12-18 LAB — POCT I-STAT 7, (LYTES, BLD GAS, ICA,H+H)
Acid-Base Excess: 5 mmol/L — ABNORMAL HIGH (ref 0.0–2.0)
Bicarbonate: 30 mmol/L — ABNORMAL HIGH (ref 20.0–28.0)
Calcium, Ion: 1.1 mmol/L — ABNORMAL LOW (ref 1.15–1.40)
HCT: 39 % (ref 36.0–46.0)
Hemoglobin: 13.3 g/dL (ref 12.0–15.0)
O2 Saturation: 99 %
Potassium: 3.5 mmol/L (ref 3.5–5.1)
Sodium: 139 mmol/L (ref 135–145)
TCO2: 31 mmol/L (ref 22–32)
pCO2 arterial: 46.1 mmHg (ref 32.0–48.0)
pH, Arterial: 7.422 (ref 7.350–7.450)
pO2, Arterial: 125 mmHg — ABNORMAL HIGH (ref 83.0–108.0)

## 2020-12-18 LAB — PROTIME-INR
INR: 1.1 (ref 0.8–1.2)
Prothrombin Time: 13.7 seconds (ref 11.4–15.2)

## 2020-12-18 LAB — GLUCOSE, CAPILLARY
Glucose-Capillary: 102 mg/dL — ABNORMAL HIGH (ref 70–99)
Glucose-Capillary: 95 mg/dL (ref 70–99)
Glucose-Capillary: 93 mg/dL (ref 70–99)
Glucose-Capillary: 112 mg/dL — ABNORMAL HIGH (ref 70–99)
Glucose-Capillary: 101 mg/dL — ABNORMAL HIGH (ref 70–99)

## 2020-12-18 LAB — MAGNESIUM: Magnesium: 1.8 mg/dL (ref 1.7–2.4)

## 2020-12-18 SURGERY — RIGHT/LEFT HEART CATH AND CORONARY ANGIOGRAPHY
Anesthesia: LOCAL

## 2020-12-18 MED ORDER — LIDOCAINE HCL (PF) 1 % IJ SOLN
INTRAMUSCULAR | Status: AC
  Filled 2020-12-18: qty 30

## 2020-12-18 MED ORDER — VERAPAMIL HCL 2.5 MG/ML IV SOLN
INTRAVENOUS | Status: DC | PRN
  Administered 2020-12-18: 10 mL via INTRA_ARTERIAL

## 2020-12-18 MED ORDER — HEPARIN (PORCINE) 25000 UT/250ML-% IV SOLN
1400.0000 [IU]/h | INTRAVENOUS | Status: DC
  Administered 2020-12-19: 1400 [IU]/h via INTRAVENOUS
  Filled 2020-12-18 (×2): qty 250

## 2020-12-18 MED ORDER — ACETAMINOPHEN 325 MG PO TABS: 650.0000 mg | ORAL_TABLET | ORAL | Status: DC | PRN

## 2020-12-18 MED ORDER — OXYCODONE-ACETAMINOPHEN 5-325 MG PO TABS: 1.0000 | ORAL_TABLET | Freq: Four times a day (QID) | ORAL | Status: DC | PRN

## 2020-12-18 MED ORDER — SODIUM CHLORIDE 0.9% FLUSH
3.0000 mL | Freq: Two times a day (BID) | INTRAVENOUS | Status: DC
  Administered 2020-12-18: 3 mL via INTRAVENOUS

## 2020-12-18 MED ORDER — ACETAMINOPHEN 325 MG PO TABS: 650.0000 mg | ORAL_TABLET | Freq: Four times a day (QID) | ORAL | Status: DC | PRN

## 2020-12-18 MED ORDER — MIDAZOLAM HCL 2 MG/2ML IJ SOLN
INTRAMUSCULAR | Status: AC
  Filled 2020-12-18: qty 2

## 2020-12-18 MED ORDER — SODIUM CHLORIDE 0.9 % IV SOLN: 250.0000 mL | INTRAVENOUS | Status: DC | PRN

## 2020-12-18 MED ORDER — ONDANSETRON HCL 4 MG/2ML IJ SOLN: 4.0000 mg | Freq: Four times a day (QID) | INTRAMUSCULAR | Status: DC | PRN

## 2020-12-18 MED ORDER — FENTANYL CITRATE (PF) 100 MCG/2ML IJ SOLN
INTRAMUSCULAR | Status: DC | PRN
  Administered 2020-12-18: 25 ug via INTRAVENOUS

## 2020-12-18 MED ORDER — SODIUM CHLORIDE 0.9 % IV SOLN: INTRAVENOUS | Status: DC

## 2020-12-18 MED ORDER — SODIUM CHLORIDE 0.9 % IV SOLN
INTRAVENOUS | Status: AC | PRN
  Administered 2020-12-18: 10 mL/h via INTRAVENOUS

## 2020-12-18 MED ORDER — LABETALOL HCL 5 MG/ML IV SOLN: 10.0000 mg | INTRAVENOUS | Status: AC | PRN

## 2020-12-18 MED ORDER — IOHEXOL 350 MG/ML SOLN
INTRAVENOUS | Status: DC | PRN
  Administered 2020-12-18: 40 mL via INTRA_ARTERIAL

## 2020-12-18 MED ORDER — MAGNESIUM SULFATE 2 GM/50ML IV SOLN
2.0000 g | Freq: Once | INTRAVENOUS | Status: AC
  Administered 2020-12-18: 2 g via INTRAVENOUS
  Filled 2020-12-18: qty 50

## 2020-12-18 MED ORDER — SODIUM CHLORIDE 0.9% FLUSH: 3.0000 mL | INTRAVENOUS | Status: DC | PRN

## 2020-12-18 MED ORDER — SODIUM CHLORIDE 0.9 % IV SOLN
250.0000 mL | INTRAVENOUS | Status: DC | PRN
Start: 2020-12-18 — End: 2020-12-19

## 2020-12-18 MED ORDER — HEPARIN (PORCINE) IN NACL 1000-0.9 UT/500ML-% IV SOLN
INTRAVENOUS | Status: DC | PRN
  Administered 2020-12-18 (×2): 500 mL

## 2020-12-18 MED ORDER — HYDRALAZINE HCL 20 MG/ML IJ SOLN: 10.0000 mg | INTRAMUSCULAR | Status: AC | PRN

## 2020-12-18 MED ORDER — VERAPAMIL HCL 2.5 MG/ML IV SOLN
INTRAVENOUS | Status: AC
  Filled 2020-12-18: qty 2

## 2020-12-18 MED ORDER — HEPARIN (PORCINE) 25000 UT/250ML-% IV SOLN: 1400.0000 [IU]/h | INTRAVENOUS | Status: DC

## 2020-12-18 MED ORDER — WARFARIN - PHARMACIST DOSING INPATIENT: Freq: Every day | Status: DC

## 2020-12-18 MED ORDER — AMIODARONE HCL 200 MG PO TABS
400.0000 mg | ORAL_TABLET | Freq: Two times a day (BID) | ORAL | Status: DC
  Administered 2020-12-18 – 2020-12-19 (×3): 400 mg via ORAL
  Filled 2020-12-18 (×3): qty 2

## 2020-12-18 MED ORDER — HEPARIN SODIUM (PORCINE) 1000 UNIT/ML IJ SOLN
INTRAMUSCULAR | Status: DC | PRN
  Administered 2020-12-18: 5000 [IU] via INTRAVENOUS

## 2020-12-18 MED ORDER — FENTANYL CITRATE (PF) 100 MCG/2ML IJ SOLN
INTRAMUSCULAR | Status: AC
  Filled 2020-12-18: qty 2

## 2020-12-18 MED ORDER — MIDAZOLAM HCL 2 MG/2ML IJ SOLN
INTRAMUSCULAR | Status: DC | PRN
  Administered 2020-12-18: 1 mg via INTRAVENOUS

## 2020-12-18 MED ORDER — LIDOCAINE HCL (PF) 1 % IJ SOLN
INTRAMUSCULAR | Status: DC | PRN
  Administered 2020-12-18 (×2): 2 mL via SUBCUTANEOUS

## 2020-12-18 MED ORDER — HEPARIN (PORCINE) IN NACL 1000-0.9 UT/500ML-% IV SOLN
INTRAVENOUS | Status: AC
  Filled 2020-12-18: qty 1000

## 2020-12-18 MED ORDER — POTASSIUM CHLORIDE CRYS ER 20 MEQ PO TBCR
40.0000 meq | EXTENDED_RELEASE_TABLET | Freq: Once | ORAL | Status: AC
  Administered 2020-12-18: 40 meq via ORAL
  Filled 2020-12-18: qty 2

## 2020-12-18 MED ORDER — OXYCODONE-ACETAMINOPHEN 5-325 MG PO TABS
2.0000 | ORAL_TABLET | Freq: Four times a day (QID) | ORAL | Status: DC | PRN
  Administered 2020-12-18 – 2020-12-19 (×4): 2 via ORAL
  Filled 2020-12-18 (×4): qty 2

## 2020-12-18 MED ORDER — DIGOXIN 125 MCG PO TABS
0.0625 mg | ORAL_TABLET | Freq: Every day | ORAL | Status: DC
  Administered 2020-12-19: 0.0625 mg via ORAL
  Filled 2020-12-18: qty 1

## 2020-12-18 MED ORDER — SODIUM CHLORIDE 0.9 % IV SOLN: INTRAVENOUS | Status: AC

## 2020-12-18 MED ORDER — SODIUM CHLORIDE 0.9% FLUSH
3.0000 mL | Freq: Two times a day (BID) | INTRAVENOUS | Status: DC
  Administered 2020-12-18 (×2): 3 mL via INTRAVENOUS

## 2020-12-18 MED ORDER — WARFARIN SODIUM 10 MG PO TABS
10.0000 mg | ORAL_TABLET | Freq: Once | ORAL | Status: AC
  Administered 2020-12-18: 10 mg via ORAL
  Filled 2020-12-18: qty 1

## 2020-12-18 SURGICAL SUPPLY — 14 items
CATH 5FR JL3.5 JR4 ANG PIG MP (CATHETERS) ×2 IMPLANT
CATH BALLN WEDGE 5F 110CM (CATHETERS) ×2 IMPLANT
DEVICE RAD COMP TR BAND LRG (VASCULAR PRODUCTS) ×2 IMPLANT
GLIDESHEATH SLEND SS 6F .021 (SHEATH) ×2 IMPLANT
GUIDEWIRE INQWIRE 1.5J.035X260 (WIRE) ×1 IMPLANT
INQWIRE 1.5J .035X260CM (WIRE) ×2
KIT HEART LEFT (KITS) ×2 IMPLANT
KIT MICROPUNCTURE NIT STIFF (SHEATH) ×2 IMPLANT
PACK CARDIAC CATHETERIZATION (CUSTOM PROCEDURE TRAY) ×2 IMPLANT
SHEATH GLIDE SLENDER 4/5FR (SHEATH) ×2 IMPLANT
SHEATH PROBE COVER 6X72 (BAG) ×2 IMPLANT
STOPCOCK MORSE 400PSI 3WAY (MISCELLANEOUS) ×2 IMPLANT
TRANSDUCER W/STOPCOCK (MISCELLANEOUS) ×2 IMPLANT
TUBING CIL FLEX 10 FLL-RA (TUBING) IMPLANT

## 2020-12-18 NOTE — Progress Notes (Signed)
ANTICOAGULATION CONSULT NOTE  Pharmacy Consult for heparin + start Coumadin Indication: atrial fibrillation and antiphospholipid antibody syndrome  Patient Measurements: Weight: 94.8 kg (208 lb 14.4 oz)  Vital Signs: Temp: 97.6 F (36.4 C) (12/20 0820) Temp Source: Oral (12/20 0820) BP: 103/57 (12/20 1400) Pulse Rate: 76 (12/20 1400)  Labs: Recent Labs    12/16/20 0341 12/16/20 0647 12/16/20 0647 12/17/20 0010 12/17/20 0730 12/17/20 1607 12/18/20 0356  HGB 13.7 13.4  --  12.4  --   --  14.0  HCT 41.2 41.9  --  37.1  --   --  42.8  PLT 102* 96*  --  99*  --   --  86*  LABPROT 16.6* 16.3*  --  14.5  --   --   --   INR 1.4* 1.4*  --  1.2  --   --   --   HEPARINUNFRC  --   --    < > 0.23* 0.34 0.67 0.47  CREATININE 0.73 0.79  --  0.70  --   --  0.79   < > = values in this interval not displayed.    Estimated Creatinine Clearance: 89.2 mL/min (by C-G formula based on SCr of 0.79 mg/dL).  Assessment: 59 y.o. female with h/o Afib and antiphospholipid antibody syndrome, Coumadin on hold  For heart cath and INR < 2.   Heparin level therapeutic 0.47, turned off for cath this AM.  Pharmacy asked to resume heparin 4 hrs after sheath out (removed 1030 AM).  Per RN report, had to re-inflate TR band due to some bleeding just now, discussed with Dr. Gala Romney, will wait another 2 hrs before resuming IV heparin  Also asked to restart Coumadin tonight.  Previous PTA dosing was 10 mg daily except 5 mg on MWF.  Goal of Therapy:  Heparin level 0.3-0.7 units/mL INR goal 2-3 Monitor platelets by anticoagulation protocol: Yes   Plan:  Restart IV heparin at previous rate of 1400 units/hr at 1630 PM. Check heparin level 6 hrs after gtt restarts. Daily heparin level and CBC. Coumadin 10 mg po x 1 tonight. Daily INR.  Reece Leader, Colon Flattery, BCCP Clinical Pharmacist  12/18/2020 2:17 PM   [facility] pharmacy phone numbers are listed on [email or web address]

## 2020-12-18 NOTE — Progress Notes (Signed)
Attempted to remove 3 cc of air from TR Band. Some oozing blood noted at site, 3 cc of air returned to patient. Will re-attempt in 30 minutes.

## 2020-12-18 NOTE — Progress Notes (Signed)
  Advanced Heart Failure Rounding Note   Subjective:    Remains on amio and heparin drips.   Diuresed well. No bleeding overnight. Rhythm stable.  CHest fullness improved with diuresis. No orthopnea or PND.   Objective:   Weight Range:  Vital Signs:   Temp:  [97.6 F (36.4 C)-98 F (36.7 C)] 97.6 F (36.4 C) (12/20 0820) Pulse Rate:  [61-67] 67 (12/20 0820) Resp:  [16-20] 20 (12/20 0820) BP: (99-130)/(49-93) 99/71 (12/20 0820) SpO2:  [94 %-100 %] 94 % (12/20 0820) Weight:  [94.8 kg] 94.8 kg (12/20 0100) Last BM Date: 12/17/20  Weight change: Filed Weights   12/16/20 0046 12/17/20 0432 12/18/20 0100  Weight: 93.8 kg 96.6 kg 94.8 kg    Intake/Output:   Intake/Output Summary (Last 24 hours) at 12/18/2020 0920 Last data filed at 12/18/2020 0449 Gross per 24 hour  Intake 672.8 ml  Output 6550 ml  Net -5877.2 ml     Physical Exam: General:  Well appearing. No resp difficulty HEENT: normal Neck: supple. JVP flat . Carotids 2+ bilat; no bruits. No lymphadenopathy or thryomegaly appreciated. Cor: PMI nondisplaced. Regular rate & rhythm. No rubs, gallops or murmurs. Lungs: clear Abdomen: soft, nontender, nondistended. No hepatosplenomegaly. No bruits or masses. Good bowel sounds. Extremities: no cyanosis, clubbing, rash, edema Neuro: alert & orientedx3, cranial nerves grossly intact. moves all 4 extremities w/o difficulty. Affect pleasant  Telemetry:  Sinus with PVCs Personally reviewed  Labs: Basic Metabolic Panel: Recent Labs  Lab 12/14/20 1921 12/15/20 0354 12/16/20 0647 12/17/20 0010 12/18/20 0356  NA 133* 134* 139 132* 135  K 3.7 3.8 3.5 3.8 3.6  CL 97* 98 102 98 98  CO2 28 28 27 25 25  GLUCOSE 115* 97 93 97 94  BUN 13 12 8 6 10  CREATININE 0.85 0.75 0.79 0.70 0.79  CALCIUM 8.4* 8.6* 8.7* 8.4* 8.8*  MG  --  2.3 2.1 2.0 1.8  PHOS  --  3.9  --   --   --     Liver Function Tests: Recent Labs  Lab 12/15/20 0354  AST 16  ALT 18  ALKPHOS 76   BILITOT 0.5  PROT 6.9  ALBUMIN 3.3*   No results for input(s): LIPASE, AMYLASE in the last 168 hours. No results for input(s): AMMONIA in the last 168 hours.  CBC: Recent Labs  Lab 12/14/20 1921 12/15/20 0354 12/16/20 0647 12/17/20 0010 12/18/20 0356  WBC 8.5 8.6 8.1 8.6 8.8  HGB 13.8 13.9 13.4 12.4 14.0  HCT 42.4 44.8 41.9 37.1 42.8  MCV 93.0 94.5 91.7 90.7 90.7  PLT 97* 97* 96* 99* 86*    Cardiac Enzymes: No results for input(s): CKTOTAL, CKMB, CKMBINDEX, TROPONINI in the last 168 hours.  BNP: BNP (last 3 results) Recent Labs    08/11/20 0230 12/14/20 1921  BNP 571.0* 209.0*    ProBNP (last 3 results) No results for input(s): PROBNP in the last 8760 hours.    Other results:  Imaging:  No results found.   Medications:     Scheduled Medications: . [MAR Hold] aspirin EC  81 mg Oral Daily  . [MAR Hold] carvedilol  6.25 mg Oral BID WC  . [MAR Hold] digoxin  0.125 mg Oral Daily  . [MAR Hold] DULoxetine  30 mg Oral Daily  . [MAR Hold] DULoxetine  60 mg Oral QPM  . [MAR Hold] ezetimibe  10 mg Oral Daily  . [MAR Hold] famotidine  20 mg Oral Daily  . [MAR   Hold] gabapentin  300 mg Oral Daily  . [MAR Hold] gabapentin  600 mg Oral QHS  . [MAR Hold] insulin aspart  0-5 Units Subcutaneous QHS  . [MAR Hold] insulin aspart  0-9 Units Subcutaneous TID WC  . [MAR Hold] multivitamin with minerals  1 tablet Oral Daily  . [MAR Hold] potassium chloride SA  20 mEq Oral Daily  . [MAR Hold] sacubitril-valsartan  1 tablet Oral BID  . sodium chloride flush  3 mL Intravenous Q12H  . sodium chloride flush  3 mL Intravenous Q12H  . [MAR Hold] torsemide  80 mg Oral Daily     Infusions: . sodium chloride    . sodium chloride    . sodium chloride    . [START ON 12/19/2020] sodium chloride    . amiodarone 30 mg/hr (12/18/20 0449)  . heparin 1,400 Units/hr (12/18/20 0040)     PRN Medications:  sodium chloride, sodium chloride, [MAR Hold] acetaminophen, [MAR Hold]  methocarbamol, [MAR Hold] oxyCODONE-acetaminophen, sodium chloride flush, sodium chloride flush   Assessment/Plan:   1. VT with syncope - s/p ICD shock on 12/16 - now back in NSR. On IV amio (amio is new) - given intermittent chest pressure and known CAD there is question of ischemic trigger though hstrop flat - cath had been planned for 4 weeks after DC-CV on 12/3. Now off warfarin and on heparin so will proceed with R/L cath this am - Keep K> 4.0 Mg > 2.0 (K 3.6 and Mg 1.8 will supp) - No driving x 6 months  2. Chronic systolic HF due to iCM [employer] ICD.  - Echo 8/13 EF 15-20%, anteroseptal AK, RV normal, severe LAE, mild MR.  - TEE 12/01/20 EF 25-30%, normal RV size with mildly decreased systolic function, moderate LAE, no LAA thrombus, mild RAE, moderate MR, mild TR  -NYHA class II symptoms at baseline - Volume status ok/low on torsemide 80 daily. Will check on RHC today - Continue carvedilol 6.25 bid (decreased from 12.5 bid with low BP) - Continue Entresto 24/26 bid - Continue digoxin 0.125 - Spiro and dapaglifozin currently on hold. Resume post cath  3. CAD:  -MI in 2008 in [place], cath at the time showed totally occluded diagonal, totally occluded LCX, and diffuse LAD disease. No intervention. She cotninues with intermittent chest pressure. hstrop flat - Continue ASA 81 daily.  - Continue Zetia, has not tolerated statins. LDL 78. Can consider repatha - given CP and VT. Plan R/L cath this am. Procedure discussed in detail with her and her nephew  4. Persistent Atrial fibrillation:  - Did not tolerate well - s/p TEE-guided DCCV on 12/3. Remains in NSR, Now on amio.  - on heparin. Resume warfarin post cath (no NOAC with anti-phospholipid syndrome)  5. Frequent PVCs  - may be contributing to CM - now on amio (this is new) and burden decreased.  EP has seen  6. 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  - She is on ASA 81 and warfarin.  - ok  with using lovenox post procedure to facilitate d/c as needed  8. Smoking: Active smoker. - Discussed need for cessation  9. PAD: She denies claudication.  - Needs to quit smoking.   10. Thrombocytopenia - PLTs running 97-> 86K this admit. (Baseline ~120k).     Length of Stay: 3   [name] Damico MD 12/18/2020, 9:20 AM  Advanced Heart Failure Team Pager [phone] (M-F; 7a - 4p)  Please contact [facility] for night-coverage after   hours (4p -7a ) and weekends on [email or web address]  

## 2020-12-18 NOTE — Progress Notes (Signed)
Progress Note  Patient Name: [name] [name] Date of Encounter: 12/18/2020  [facility] Cardiologist: Nona Dell, MD  EP: Dr. Ladona Ridgel  Subjective   No complaints  Inpatient Medications    Scheduled Meds: . aspirin EC  81 mg Oral Daily  . carvedilol  6.25 mg Oral BID WC  . digoxin  0.125 mg Oral Daily  . DULoxetine  30 mg Oral Daily  . DULoxetine  60 mg Oral QPM  . ezetimibe  10 mg Oral Daily  . famotidine  20 mg Oral Daily  . gabapentin  300 mg Oral Daily  . gabapentin  600 mg Oral QHS  . insulin aspart  0-5 Units Subcutaneous QHS  . insulin aspart  0-9 Units Subcutaneous TID WC  . multivitamin with minerals  1 tablet Oral Daily  . potassium chloride SA  20 mEq Oral Daily  . sacubitril-valsartan  1 tablet Oral BID  . sodium chloride flush  3 mL Intravenous Q12H  . torsemide  80 mg Oral Daily  . warfarin  10 mg Oral ONCE-1600  . Warfarin - Pharmacist Dosing Inpatient   Does not apply q1600   Continuous Infusions: . sodium chloride 75 mL/hr at 12/18/20 1128  . sodium chloride    . amiodarone 30 mg/hr (12/18/20 0449)  . heparin     PRN Meds: sodium chloride, acetaminophen, acetaminophen, hydrALAZINE, labetalol, methocarbamol, ondansetron (ZOFRAN) IV, oxyCODONE-acetaminophen, sodium chloride flush   Vital Signs    Vitals:   12/18/20 1049 12/18/20 1115 12/18/20 1130 12/18/20 1200  BP: (!) 128/54 (!) 108/54 (!) 125/54 (!) 109/54  Pulse: (!) 58 61 (!) 57 66  Resp: 14 16 16 16   Temp:      TempSrc:      SpO2: 94% 96% 97% 99%  Weight:        Intake/Output Summary (Last 24 hours) at 12/18/2020 1236 Last data filed at 12/18/2020 0449 Gross per 24 hour  Intake 672.8 ml  Output 6550 ml  Net -5877.2 ml   Last 3 Weights 12/18/2020 12/17/2020 12/16/2020  Weight (lbs) 208 lb 14.4 oz 212 lb 15.4 oz 206 lb 12.7 oz  Weight (kg) 94.756 kg 96.6 kg 93.8 kg      Telemetry    SR, some APacing, a few NSVT longest 7 beats, intermittently has brief periods of  bigeminy - Personally Reviewed  ECG    No new EKGs - Personally Reviewed  Physical Exam   GEN: No acute distress.   Neck: No JVD Cardiac: RRR, no murmurs, rubs, or gallops.  Respiratory: CTA b/l. GI: Soft, nontender, non-distended  MS: No edema; No deformity. Neuro:  Nonfocal  Psych: Normal affect   Labs    High Sensitivity Troponin:   Recent Labs  Lab 12/14/20 1921 12/15/20 0112 12/15/20 1057  TROPONINIHS 15 18* 13      Chemistry Recent Labs  Lab 12/15/20 0354 12/16/20 0341 12/16/20 0647 12/17/20 0010 12/18/20 0356  NA 134*   < > 139 132* 135  K 3.8   < > 3.5 3.8 3.6  CL 98   < > 102 98 98  CO2 28   < > 27 25 25   GLUCOSE 97   < > 93 97 94  BUN 12   < > 8 6 10   CREATININE 0.75   < > 0.79 0.70 0.79  CALCIUM 8.6*   < > 8.7* 8.4* 8.8*  PROT 6.9  --   --   --   --   ALBUMIN  3.3*  --   --   --   --   AST 16  --   --   --   --   ALT 18  --   --   --   --   ALKPHOS 76  --   --   --   --   BILITOT 0.5  --   --   --   --   GFRNONAA >60   < > >60 >60 >60  ANIONGAP 8   < > 10 9 12    < > = values in this interval not displayed.     Hematology Recent Labs  Lab 12/16/20 0647 12/17/20 0010 12/18/20 0356  WBC 8.1 8.6 8.8  RBC 4.57 4.09 4.72  HGB 13.4 12.4 14.0  HCT 41.9 37.1 42.8  MCV 91.7 90.7 90.7  MCH 29.3 30.3 29.7  MCHC 32.0 33.4 32.7  RDW 13.1 13.2 12.9  PLT 96* 99* 86*    BNP Recent Labs  Lab 12/14/20 1921  BNP 209.0*     DDimer No results for input(s): DDIMER in the last 168 hours.   Radiology      Cardiac Studies    12/01/2020: TEE IMPRESSIONS  1. Left ventricular ejection fraction, by estimation, is 25 to 30%. The  left ventricle has severely decreased function. The left ventricle  demonstrates regional wall motion abnormalities with severe hypokinesis to  akinesis of the anterior and septal  walls. The left ventricular internal cavity size was mildly dilated.  2. Right ventricular systolic function is mildly reduced. The right   ventricular size is normal.  3. Left atrial size was moderately dilated. No left atrial/left atrial  appendage thrombus was detected.  4. Right atrial size was mildly dilated.  5. There was moderate mitral regurgitation with ERO 0.26 cm^2 by PISA  (suspect functional MR). No evidence of mitral stenosis.  6. No PFO/ASD by color doppler.  7. The aortic valve is tricuspid. Aortic valve regurgitation is not  visualized. No aortic stenosis is present.  8. Peak RV-RA gradient 18 mmHg.  9. Normal caliber thoracic aorta with no significant plaque.     12/01/2020: R/LHC  1st Diag lesion is 100% stenosed.  Mid LAD lesion is 45% stenosed.  Prox Cx to Mid Cx lesion is 95% stenosed.  Prox RCA lesion is 30% stenosed.   Findings:  Ao = 125/65 (88)  LV = 127/13 RA =  1 RV = 27/4 PA = 24/6 (14) PCW = 12 Fick cardiac output/index = 6.2/3.0 PVR = 0.3 WU Ao sat = 99% PA sat = 76%, 77% High SVC sat = 80%  Assessment: 1. 2v CAD with 95% mid LCX and chronically occluded D1 2. EF 25% 3. Well compensated hemodynamics with high cardiac output.  4. No evidence of intracardiac shunting  Plan/Discussion:  Cath results very similar to cath report from 2008 though LCx is 95% stenosed (not 100% as previously reported). I reviewed cath with Dr. 2009. Lcx is amenable to PCI but with dropping platelets and absolute need for warfarin and ASA with anti-phospholipid syndrome and recent DC-CV will defer PCI for now particularly in light of normal hstrop. Can reconsider in January or February when farther out from [facility] if platelets rebounded.    Patient Profile     59 y.o. female w/PMHx of CVA with total occlusion of the RICA,anti-phospholipid antibody syndrome, CAD(MI 2008 in [facility] with reported totally occluded diagonal/Cx and diffuse LAD disease without intervention), ischemic cardiomyopathy, chronic combined  CHF, [employer] ICD,persistent atrial fibrillation(started 10/2020),  remote cocaine abuse, brain aneurysm followed by Dr. Corliss Skains, PAD(prior angio 2015 -> med rx), tobacco abuse, HLD (statin intolerant), VT (prior remote shocks for this).  Admitted with syncope came to quickly and was transported to [facility] ED. Int he ED BP 71/43 with repeated f/u value 105/58.K 3.7 Mg 2.3. Device interrogation with rapid VT 261bpm orresponding to time of syncopefollowed by ATP then shock x1 with successful conversion to NSR   Assessment & Plan    1. VT treated with successful ICD shock Seen in consult yetsterday by Dr. Lalla Brothers,  Likely scar mediated VT from significant coronary artery disease.  Does not sound like a primary ischemic event led to the VT (flat troponin trend). Amiodarone gtt continued with eventual transition to PO, he discussed this medication with the patient and her nephew at the bedside including the potential risks.  Now s/p R/LHC as noted above Will continue with amiodarone, transition to PO Recommend 400mg  BID for 5 days then 200mg  daily   2. PVCs Likely secondary to severe underlying cardiomyopathy.  At least 3-4 morphologies on ECGs making ablation therapy not an ideal management strategy. amiodarone  3. AF S/p recent TEE/DCCV 12/01/2020 cont amiodarone  4. CAD     As above     C/w attending cardiology team   For questions or updates, please contact [facility] Please consult [email or web address] for contact info under        Signed, , PA-C  12/18/2020, 12:36 PM

## 2020-12-18 NOTE — H&P (View-Only) (Signed)
Advanced Heart Failure Rounding Note   Subjective:    Remains on amio and heparin drips.   Diuresed well. No bleeding overnight. Rhythm stable.  CHest fullness improved with diuresis. No orthopnea or PND.   Objective:   Weight Range:  Vital Signs:   Temp:  [97.6 F (36.4 C)-98 F (36.7 C)] 97.6 F (36.4 C) (12/20 0820) Pulse Rate:  [61-67] 67 (12/20 0820) Resp:  [16-20] 20 (12/20 0820) BP: (99-130)/(49-93) 99/71 (12/20 0820) SpO2:  [94 %-100 %] 94 % (12/20 0820) Weight:  [94.8 kg] 94.8 kg (12/20 0100) Last BM Date: 12/17/20  Weight change: Filed Weights   12/16/20 0046 12/17/20 0432 12/18/20 0100  Weight: 93.8 kg 96.6 kg 94.8 kg    Intake/Output:   Intake/Output Summary (Last 24 hours) at 12/18/2020 0920 Last data filed at 12/18/2020 0449 Gross per 24 hour  Intake 672.8 ml  Output 6550 ml  Net -5877.2 ml     Physical Exam: General:  Well appearing. No resp difficulty HEENT: normal Neck: supple. JVP flat . Carotids 2+ bilat; no bruits. No lymphadenopathy or thryomegaly appreciated. Cor: PMI nondisplaced. Regular rate & rhythm. No rubs, gallops or murmurs. Lungs: clear Abdomen: soft, nontender, nondistended. No hepatosplenomegaly. No bruits or masses. Good bowel sounds. Extremities: no cyanosis, clubbing, rash, edema Neuro: alert & orientedx3, cranial nerves grossly intact. moves all 4 extremities w/o difficulty. Affect pleasant  Telemetry:  Sinus with PVCs Personally reviewed  Labs: Basic Metabolic Panel: Recent Labs  Lab 12/14/20 1921 12/15/20 0354 12/16/20 0647 12/17/20 0010 12/18/20 0356  NA 133* 134* 139 132* 135  K 3.7 3.8 3.5 3.8 3.6  CL 97* 98 102 98 98  CO2 28 28 27 25 25   GLUCOSE 115* 97 93 97 94  BUN 13 12 8 6 10   CREATININE 0.85 0.75 0.79 0.70 0.79  CALCIUM 8.4* 8.6* 8.7* 8.4* 8.8*  MG  --  2.3 2.1 2.0 1.8  PHOS  --  3.9  --   --   --     Liver Function Tests: Recent Labs  Lab 12/15/20 0354  AST 16  ALT 18  ALKPHOS 76   BILITOT 0.5  PROT 6.9  ALBUMIN 3.3*   No results for input(s): LIPASE, AMYLASE in the last 168 hours. No results for input(s): AMMONIA in the last 168 hours.  CBC: Recent Labs  Lab 12/14/20 1921 12/15/20 0354 12/16/20 0647 12/17/20 0010 12/18/20 0356  WBC 8.5 8.6 8.1 8.6 8.8  HGB 13.8 13.9 13.4 12.4 14.0  HCT 42.4 44.8 41.9 37.1 42.8  MCV 93.0 94.5 91.7 90.7 90.7  PLT 97* 97* 96* 99* 86*    Cardiac Enzymes: No results for input(s): CKTOTAL, CKMB, CKMBINDEX, TROPONINI in the last 168 hours.  BNP: BNP (last 3 results) Recent Labs    08/11/20 0230 12/14/20 1921  BNP 571.0* 209.0*    ProBNP (last 3 results) No results for input(s): PROBNP in the last 8760 hours.    Other results:  Imaging:  No results found.   Medications:     Scheduled Medications: . [MAR Hold] aspirin EC  81 mg Oral Daily  . [MAR Hold] carvedilol  6.25 mg Oral BID WC  . [MAR Hold] digoxin  0.125 mg Oral Daily  . [MAR Hold] DULoxetine  30 mg Oral Daily  . [MAR Hold] DULoxetine  60 mg Oral QPM  . [MAR Hold] ezetimibe  10 mg Oral Daily  . [MAR Hold] famotidine  20 mg Oral Daily  . [facility]  Hold] gabapentin  300 mg Oral Daily  . [MAR Hold] gabapentin  600 mg Oral QHS  . [MAR Hold] insulin aspart  0-5 Units Subcutaneous QHS  . [MAR Hold] insulin aspart  0-9 Units Subcutaneous TID WC  . [MAR Hold] multivitamin with minerals  1 tablet Oral Daily  . [MAR Hold] potassium chloride SA  20 mEq Oral Daily  . [MAR Hold] sacubitril-valsartan  1 tablet Oral BID  . sodium chloride flush  3 mL Intravenous Q12H  . sodium chloride flush  3 mL Intravenous Q12H  . [MAR Hold] torsemide  80 mg Oral Daily     Infusions: . sodium chloride    . sodium chloride    . sodium chloride    . [START ON 12/19/2020] sodium chloride    . amiodarone 30 mg/hr (12/18/20 0449)  . heparin 1,400 Units/hr (12/18/20 0040)     PRN Medications:  sodium chloride, sodium chloride, [MAR Hold] acetaminophen, [MAR Hold]  methocarbamol, [MAR Hold] oxyCODONE-acetaminophen, sodium chloride flush, sodium chloride flush   Assessment/Plan:   1. VT with syncope - s/p ICD shock on 12/16 - now back in NSR. On IV amio (amio is new) - given intermittent chest pressure and known CAD there is question of ischemic trigger though hstrop flat - cath had been planned for 4 weeks after DC-CV on 12/3. Now off warfarin and on heparin so will proceed with R/L cath this am - Keep K> 4.0 Mg > 2.0 (K 3.6 and Mg 1.8 will supp) - No driving x 6 months  2. Chronic systolic HF due to [employer] ICD.  - Echo 8/13 EF 15-20%, anteroseptal AK, RV normal, severe LAE, mild MR.  - TEE 12/01/20 EF 25-30%, normal RV size with mildly decreased systolic function, moderate LAE, no LAA thrombus, mild RAE, moderate MR, mild TR  -NYHA class II symptoms at baseline - Volume status ok/low on torsemide 80 daily. Will check on RHC today - Continue carvedilol 6.25 bid (decreased from 12.5 bid with low BP) - Continue Entresto 24/26 bid - Continue digoxin 0.125 - Spiro and dapaglifozin currently on hold. Resume post cath  3. CAD:  -MI in 2008 in [facility], cath at the time showed totally occluded diagonal, totally occluded LCX, and diffuse LAD disease. No intervention. She cotninues with intermittent chest pressure. hstrop flat - Continue ASA 81 daily.  - Continue Zetia, has not tolerated statins. LDL 78. Can consider repatha - given CP and VT. Plan R/L cath this am. Procedure discussed in detail with her and her nephew  4. Persistent Atrial fibrillation:  - Did not tolerate well - s/p TEE-guided DCCV on 12/3. Remains in NSR, Now on amio.  - on heparin. Resume warfarin post cath (no NOAC with anti-phospholipid syndrome)  5. Frequent PVCs  - may be contributing to CM - now on amio (this is new) and burden decreased.  EP has seen  6. 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  - She is on ASA 81 and warfarin.  - ok  with using lovenox post procedure to facilitate d/c as needed  8. Smoking: Active smoker. - Discussed need for cessation  9. PAD: She denies claudication.  - Needs to quit smoking.   10. Thrombocytopenia - PLTs running 97-> 86K this admit. (Baseline ~120k).     Length of Stay: 3   [name] MD 12/18/2020, 9:20 AM  Advanced Heart Failure Team Pager [phone] (M-F; 7a - 4p)  Please contact [facility] for night-coverage after  hours (4p -7a ) and weekends on [email or web address]

## 2020-12-18 NOTE — Progress Notes (Signed)
Attempted to remove 1 cc of air from TR Band. Oozing at site. 1 CC reinflated. BP stable. Will re- attempt in 30 minutes.

## 2020-12-18 NOTE — Interval H&P Note (Signed)
History and Physical Interval Note:  12/18/2020 9:43 AM  [name] [name]  has presented today for surgery, with the diagnosis of HF.  The various methods of treatment have been discussed with the patient and family. After consideration of risks, benefits and other options for treatment, the patient has consented to  Procedure(s): RIGHT/LEFT HEART CATH AND CORONARY ANGIOGRAPHY (N/A) and possible coronary angioplasty as a surgical intervention.  The patient's history has been reviewed, patient examined, no change in status, stable for surgery.  I have reviewed the patient's chart and labs.  Questions were answered to the patient's satisfaction.     [name]

## 2020-12-18 NOTE — Progress Notes (Signed)
Notes reviewed, cath films and cath report reviewed. Discussed findings with patient at the bedside.  Patient is being followed by EP and AHF teams.   Answered all questions regarding current care.   TR band examined, well seated, normal distal perfusion.   No other concerns from patient. No current CP or SOB.

## 2020-12-18 NOTE — Progress Notes (Signed)
ANTICOAGULATION CONSULT NOTE - Initial Consult  Pharmacy Consult for heparin Indication: atrial fibrillation and antiphospholipid antibody syndrome  Patient Measurements: Weight: 94.8 kg (208 lb 14.4 oz)  Vital Signs: Temp: 97.6 F (36.4 C) (12/20 1957) Temp Source: Oral (12/20 1957) BP: 114/56 (12/20 1957) Pulse Rate: 61 (12/20 1957)  Labs: Recent Labs    12/16/20 0647 12/17/20 0010 12/17/20 0730 12/17/20 1607 12/18/20 0356 12/18/20 0958 12/18/20 1005 12/18/20 1017 12/18/20 1024 12/18/20 2054 12/18/20 2248  HGB 13.4 12.4  --   --  14.0   < > 13.3 12.6 13.6  --   --   HCT 41.9 37.1  --   --  42.8   < > 39.0 37.0 40.0  --   --   PLT 96* 99*  --   --  86*  --   --   --   --   --   --   LABPROT 16.3* 14.5  --   --   --   --   --   --   --  13.7  --   INR 1.4* 1.2  --   --   --   --   --   --   --  1.1  --   HEPARINUNFRC  --  0.23*   < > 0.67 0.47  --   --   --   --   --  0.32  CREATININE 0.79 0.70  --   --  0.79  --   --   --   --   --   --    < > = values in this interval not displayed.    Estimated Creatinine Clearance: 89.2 mL/min (by C-G formula based on SCr of 0.79 mg/dL).  Assessment: 59 y.o. female with h/o Afib and antiphospholipid antibody syndrome now s/p cath, for heparin  Goal of Therapy:  Heparin level 0.3-0.7 units/mL Monitor platelets by anticoagulation protocol: Yes   Plan:  Continue Heparin at current rate   Geannie Risen, PharmD, BCPS 12/18/2020 11:46 PM

## 2020-12-19 ENCOUNTER — Other Ambulatory Visit (HOSPITAL_COMMUNITY): Payer: Self-pay | Admitting: Cardiology

## 2020-12-19 LAB — BASIC METABOLIC PANEL
Anion gap: 12 (ref 5–15)
BUN: 9 mg/dL (ref 6–20)
CO2: 28 mmol/L (ref 22–32)
Calcium: 9.2 mg/dL (ref 8.9–10.3)
Chloride: 97 mmol/L — ABNORMAL LOW (ref 98–111)
Creatinine, Ser: 0.8 mg/dL (ref 0.44–1.00)
GFR, Estimated: >60 mL/min (ref >60)
Glucose, Bld: 101 mg/dL — ABNORMAL HIGH (ref 70–99)
Potassium: 4.4 mmol/L (ref 3.5–5.1)
Sodium: 137 mmol/L (ref 135–145)

## 2020-12-19 LAB — CBC
HCT: 44.5 % (ref 36.0–46.0)
Hemoglobin: 14.6 g/dL (ref 12.0–15.0)
MCH: 29.6 pg (ref 26.0–34.0)
MCHC: 32.8 g/dL (ref 30.0–36.0)
MCV: 90.3 fL (ref 80.0–100.0)
Platelets: 70 10*3/uL — ABNORMAL LOW (ref 150–400)
RBC: 4.93 MIL/uL (ref 3.87–5.11)
RDW: 13 % (ref 11.5–15.5)
WBC: 7.9 10*3/uL (ref 4.0–10.5)
nRBC: 0 % (ref 0.0–0.2)

## 2020-12-19 LAB — GLUCOSE, CAPILLARY
Glucose-Capillary: 95 mg/dL (ref 70–99)
Glucose-Capillary: 121 mg/dL — ABNORMAL HIGH (ref 70–99)

## 2020-12-19 LAB — PROTIME-INR
INR: 1.1 (ref 0.8–1.2)
Prothrombin Time: 13.9 seconds (ref 11.4–15.2)

## 2020-12-19 LAB — HEPARIN LEVEL (UNFRACTIONATED): Heparin Unfractionated: 0.4 IU/mL (ref 0.30–0.70)

## 2020-12-19 MED ORDER — ENOXAPARIN SODIUM 100 MG/ML ~~LOC~~ SOLN: 100.0000 mg | Freq: Two times a day (BID) | SUBCUTANEOUS | 0 refills | Status: DC

## 2020-12-19 MED ORDER — WARFARIN SODIUM 2.5 MG PO TABS
12.5000 mg | ORAL_TABLET | Freq: Once | ORAL | Status: AC
  Administered 2020-12-19: 12.5 mg via ORAL
  Filled 2020-12-19: qty 1

## 2020-12-19 MED ORDER — DIGOXIN 62.5 MCG PO TABS: 0.0625 mg | ORAL_TABLET | Freq: Every day | ORAL | 5 refills | Status: DC

## 2020-12-19 MED ORDER — CARVEDILOL 6.25 MG PO TABS: 6.2500 mg | ORAL_TABLET | Freq: Two times a day (BID) | ORAL | 5 refills | Status: DC

## 2020-12-19 MED ORDER — ENOXAPARIN SODIUM 100 MG/ML ~~LOC~~ SOLN
90.0000 mg | SUBCUTANEOUS | Status: AC
  Administered 2020-12-19: 90 mg via SUBCUTANEOUS
  Filled 2020-12-19: qty 1

## 2020-12-19 MED ORDER — AMIODARONE HCL 400 MG PO TABS: 400.0000 mg | ORAL_TABLET | Freq: Two times a day (BID) | ORAL | 5 refills | Status: DC

## 2020-12-19 MED FILL — AMIODARONE HCL 200 MG TAB: 200 | 30 days supply | Qty: 120 | Fill #0

## 2020-12-19 MED FILL — ENOXAPARIN SODIUM 100 MG/ML: 100 | 16 days supply | Qty: 20 | Fill #0

## 2020-12-19 MED FILL — DIGOXIN 0.125 MG TABLET: 125 | 30 days supply | Qty: 15 | Fill #0

## 2020-12-19 MED FILL — CARVEDILOL 6.25 MG TABLET: 6.25 | 30 days supply | Qty: 60 | Fill #0

## 2020-12-19 NOTE — Progress Notes (Signed)
Patient alert and oriented , denies shortness of breath, d/c instruction explain and copy given, educate patient how to give herself lovenox patient verbalized understanding. Iv and tele d/c.

## 2020-12-19 NOTE — Care Management Important Message (Signed)
Important Message  Patient Details  Name: [name] [name] MRN: [id number] Date of Birth: Jun 27, 1961   Medicare Important Message Given:  Yes     Renie Ora 12/19/2020, 12:32 PM

## 2020-12-19 NOTE — Discharge Summary (Addendum)
Advanced Heart Failure Team  Discharge Summary   Patient ID: [name] [name] MRN: [id number], DOB/AGE: 08-02-1961 59 y.o. 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 Corliss [name], PAD(prior angio 2015 -> med rx), tobacco abuse, HLD (statin intolerant), VT (prior remote shocks for this).  She has been followed by Dr. Diona [name] but recently established care with Dr. Jearld [name] in [facility]. She also follows with Dr. Ladona [name] for EP. [name]. She had recently gone into AF with worsening CHF symptoms. He arranged for TEE-DCCV, increased spironolactone, added Farxiga, and felt he'd eventually like to pursue repeat R/LHC to look for revascularizable CAD and assess filling pressures and cardiac output. She underwent TEE/DCCV on 12/3 with mildly dilated LV, EF 25-30%, normal RV size with mildly decreased systolic function, moderate LAE, no LAA thrombus, mild RAE, moderate MR, mild TR -> DCCV performed with conversion to NSR.  On 12/16 she had abrupt syncope while talking to a friend. No preceding CP. She came to quickly and was transported to the [facility] ED. Int he ED BP 71/43 with repeated f/u value 105/58.K 3.7 Mg 2.3. Device interrogation with rapid VT 261bpm orresponding to time of syncopefollowed by ATP then shock x1 with  successful conversion to NSR per d/w [employer] rep. Interrogation report also reports includes prior shock in 2017, 2018 and 02/2019. Rep also reports she has had 231,000 PVCs since last check 12/3 (no % provided).  She was transferred to [facility] for further management. Hstrop  13 -> 18 -> 15 -> 12 -> 13. Felt ok but reported occasional chest pressure. Switched to heparin for possible cath given CP and VT. No further VT on IV amio.   R/LHC done 12/20. Cath results very similar to cath report from 2008 though LCx is 95% stenosed (not 100% as previously reported). Dr. Gala [name] reviewed cath with Dr. Excell [name]. Lcx is amenable to PCI but with dropping platelets (down to 70k) and absolute need for warfarin and ASA with anti-phospholipid syndrome and recent DC-CV will defer PCI for now particularly in light of normal hstrop. Can reconsider in January or February when farther out from [facility] if platelets rebounded.   Post cath, coumadin was resumed w/ heparin bridge. Prior cardiac meds resumed. She was monitored and had no further VT. Ectopy well suppressed w/ amio. She was transitioned off of amio gtt to PO at 400 mg bid. EP advised no driving for 6 months. She was chest pain free day of d/c, vital signs, labs and volume status stable. INR 1.1. Plan to d/c home w/ Lovenox bridge w/ repeat INR check on 1/27.  She was last seen and examined by Dr. Gala [name] on 12/21 and felt stable for d/c home. F/u w/ Dr. Shirlee [name] arranged for 12/27.    Discharge Weight Range: 207 lb  Discharge Vitals: Blood pressure 127/61, pulse 61, temperature 97.8 F (36.6 C), temperature source Oral, resp. rate  18, weight 93.9 kg, SpO2 97 %.  Labs: Lab Results  Component Value Date   WBC 7.9 12/19/2020   HGB 14.6 12/19/2020   HCT 44.5 12/19/2020   MCV 90.3 12/19/2020   PLT 70 (L) 12/19/2020    Recent Labs  Lab 12/15/20 0354 12/16/20 0341 12/19/20 1224  NA 134*   < > 137  K 3.8   < > 4.4  CL 98   < > 97*  CO2 28   < > 28  BUN 12    < > 9  CREATININE 0.75   < > 0.80  CALCIUM 8.6*   < > 9.2  PROT 6.9  --   --   BILITOT 0.5  --   --   ALKPHOS 76  --   --   ALT 18  --   --   AST 16  --   --   GLUCOSE 97   < > 101*   < > = values in this interval not displayed.   Lab Results  Component Value Date   CHOL 120 12/16/2020   HDL 31 (L) 12/16/2020   LDLCALC 72 12/16/2020   TRIG 85 12/16/2020   BNP (last 3 results) Recent Labs    08/11/20 0230 12/14/20 1921  BNP 571.0* 209.0*    ProBNP (last 3 results) No results for input(s): PROBNP in the last 8760 hours.   Diagnostic Studies/Procedures   CARDIAC CATHETERIZATION  Result Date: 12/18/2020  1st Diag lesion is 100% stenosed.  Mid LAD lesion is 45% stenosed.  Prox Cx to Mid Cx lesion is 95% stenosed.  Prox RCA lesion is 30% stenosed.  Findings: Ao = 125/65 (88) LV = 127/13 RA =  1 RV = 27/4 PA = 24/6 (14) PCW = 12 Fick cardiac output/index = 6.2/3.0 PVR = 0.3 WU Ao sat = 99% PA sat = 76%, 77% High SVC sat = 80% Assessment: 1. 2v CAD with 95% mid LCX and chronically occluded D1 2. EF 25% 3. Well compensated hemodynamics with high cardiac output. 4. No evidence of intracardiac shunting Plan/Discussion: Cath results very similar to cath report from 2008 though LCx is 95% stenosed (not 100% as previously reported). I reviewed cath with Dr. Excell [name]. Lcx is amenable to PCI but with dropping platelets and absolute need for warfarin and ASA with anti-phospholipid syndrome and recent DC-CV will defer PCI for now particularly in light of normal hstrop. Can reconsider in January or February when farther out from [facility] if platelets rebounded. [name] Meres, MD 10:35 AM    Discharge Medications   Allergies as of 12/19/2020      Reactions   Beef-derived Products Anaphylaxis   From tick bite   Metoclopramide Hcl Anaphylaxis   Non responsive   Penicillins Anaphylaxis   Pt states she can take any cephalosporin (per pt. 01/01/19) Shock Has patient had a PCN reaction  causing immediate rash, facial/tongue/throat swelling, SOB or lightheadedness with hypotension: Yes Has patient had a PCN reaction causing severe rash involving mucus membranes or skin necrosis: No Has patient had a PCN reaction that required hospitalization Yes Has patient had a PCN reaction occurring within the last 10 years: No If all of the above answers are "NO", then may proceed with Cephalosporin    Pork-derived Products Anaphylaxis   From tick bite. Has tolerated heparin   Metoprolol Other (See Comments)   Syncope   Reglan [metoclopramide]    Statins Other (See Comments)   Myalgias  Medication List    TAKE these medications   amiodarone 400 MG tablet Commonly known as: PACERONE Take 1 tablet (400 mg total) by mouth 2 (two) times daily.   aspirin EC 81 MG tablet Take 81 mg by mouth daily. Swallow whole.   carvedilol 6.25 MG tablet Commonly known as: COREG Take 1 tablet (6.25 mg total) by mouth 2 (two) times daily with a meal. What changed:   medication strength  how much to take   dapagliflozin propanediol 10 MG Tabs tablet Commonly known as: Farxiga Take 1 tablet (10 mg total) by mouth daily before breakfast.   Digoxin 62.5 MCG Tabs Take 0.0625 mg by mouth daily. Start taking on: December 20, 2020 What changed:   medication strength  how much to take   DULoxetine 30 MG capsule Commonly known as: CYMBALTA Take 30 mg by mouth daily.   DULoxetine 60 MG capsule Commonly known as: CYMBALTA Take 60 mg by mouth every evening.   enoxaparin 100 MG/ML injection Commonly known as: LOVENOX Inject 1 mL (100 mg total) into the skin 2 (two) times daily for 20 doses.   Entresto 24-26 MG Generic drug: sacubitril-valsartan Take 1 tablet by mouth 2 (two) times daily.   ezetimibe 10 MG tablet Commonly known as: ZETIA Take 1 tablet (10 mg total) by mouth daily.   famotidine 20 MG tablet Commonly known as: PEPCID Take 1 tablet (20 mg total) by mouth 2 (two)  times daily. What changed: when to take this   fluticasone 50 MCG/ACT nasal spray Commonly known as: FLONASE Place 1 spray into both nostrils daily as needed for allergies.   gabapentin 300 MG capsule Commonly known as: NEURONTIN Take 300 mg by mouth See admin instructions. Take 300 mg  in the AM and 600 mg  in the PM   multivitamins ther. w/minerals Tabs tablet Take 1 tablet by mouth daily.   nitroGLYCERIN 0.4 MG SL tablet Commonly known as: NITROSTAT PLACE 1 TABLET UNDER THE TONGUE EVERY 5 MINUTES FOR 3 DOSES AS NEEDED CHEST PAIN What changed: See the new instructions.   oxyCODONE-acetaminophen 10-325 MG tablet Commonly known as: PERCOCET Take 1 tablet by mouth every 4 (four) hours as needed for pain.   potassium chloride SA 20 MEQ tablet Commonly known as: KLOR-CON TAKE 1 TABLET BY MOUTH EVERY DAY   promethazine 25 MG tablet Commonly known as: PHENERGAN Take 25 mg by mouth every 6 (six) hours as needed for nausea. With pain medication   spironolactone 25 MG tablet Commonly known as: ALDACTONE Take 1 tablet (25 mg total) by mouth daily. What changed: when to take this   torsemide 20 MG tablet Commonly known as: DEMADEX Take 4 tablets (80 mg total) by mouth daily. May take extra tablet as needed   warfarin 10 MG tablet Commonly known as: COUMADIN Take as directed. If you are unsure how to take this medication, talk to your nurse or doctor. Original instructions: TAKE 1 TABLET BY MOUTH EVERY DAY EXCEPT 1/2 TABLET ON MONDAYS AND THURSDAYS What changed: See the new instructions.       Disposition   The patient will be discharged in stable condition to home.   Follow-up Information    [name], MD Follow up on 12/25/2020.   Specialty: Cardiology Why: Advanced Heart Failure Clinic 2:00 PM Parking Garage Code 3009  Contact information: [address] [place] [address] [phone]        Ignatius Specking, MD.  Specialty: Internal  Medicine Why: Please follow up in a week Contact information: [address] [place] [address] [phone]                 Duration of Discharge Encounter: Greater than 35 minutes   Signed, Robbie Lis, PA-C 12/19/2020, 3:20 PM  Ok for d/c today. See rounding note for full details.   [name] Meres, MD  4:19 PM

## 2020-12-19 NOTE — Progress Notes (Addendum)
Advanced Heart Failure Rounding Note   Subjective:    Diagnostic R/LHC done yesterday. No intervention (see cath details below)  Stable w/o CP. No dyspnea. No further VT on tele. Radial cath site stable.   INR 1.1 today. BMP and Mg level pending.   She feels well. No complaints today.   [facility] 12/18/20  1st Diag lesion is 100% stenosed.  Mid LAD lesion is 45% stenosed.  Prox Cx to Mid Cx lesion is 95% stenosed.  Prox RCA lesion is 30% stenosed.   Findings:  Ao = 125/65 (88)  LV = 127/13 RA =  1 RV = 27/4 PA = 24/6 (14) PCW = 12 Fick cardiac output/index = 6.2/3.0 PVR = 0.3 WU Ao sat = 99% PA sat = 76%, 77% High SVC sat = 80%  Assessment: 1. 2v CAD with 95% mid LCX and chronically occluded D1 2. EF 25% 3. Well compensated hemodynamics with high cardiac output.  4. No evidence of intracardiac shunting    Objective:   Weight Range:  Vital Signs:   Temp:  [97.6 F (36.4 C)-98.8 F (37.1 C)] 98.2 F (36.8 C) (12/21 0939) Pulse Rate:  [57-76] 65 (12/21 0939) Resp:  [14-17] 16 (12/21 0939) BP: (95-128)/(46-73) 96/58 (12/21 0939) SpO2:  [92 %-99 %] 92 % (12/21 0939) Weight:  [93.9 kg] 93.9 kg (12/21 0612) Last BM Date: 12/17/20  Weight change: Filed Weights   12/17/20 0432 12/18/20 0100 12/19/20 0612  Weight: 96.6 kg 94.8 kg 93.9 kg    Intake/Output:   Intake/Output Summary (Last 24 hours) at 12/19/2020 1047 Last data filed at 12/19/2020 7096 Gross per 24 hour  Intake 1376.95 ml  Output 5800 ml  Net -4423.05 ml     Physical Exam: General:  Well appearing moderately obese female laying in bed. No respiratory difficulty HEENT: normal Neck: supple. no JVD. Carotids 2+ bilat; no bruits. No lymphadenopathy or thyromegaly appreciated. Cor: PMI nondisplaced. Regular rate & rhythm. No rubs, gallops or murmurs. Lungs: clear Abdomen: soft, nontender, nondistended. No hepatosplenomegaly. No bruits or masses. Good bowel sounds. Extremities: no  cyanosis, clubbing, rash, edema, multiple bruises upper extremities, rt radial pulse 2+  Neuro: alert & oriented x 3, cranial nerves grossly intact. moves all 4 extremities w/o difficulty. Affect pleasant.   Telemetry:  NSR occasional PVCs. No further VT Personally reviewed  Labs: Basic Metabolic Panel: Recent Labs  Lab 12/15/20 0354 12/16/20 0341 12/16/20 0647 12/17/20 0010 12/18/20 0356 12/18/20 0958 12/18/20 1004 12/18/20 1005 12/18/20 1017 12/18/20 1024  NA 134* 138 139 132* 135 139 149* 140 141 137  K 3.8 3.4* 3.5 3.8 3.6 3.5 2.4* 3.5 3.2* 3.7  CL 98 101 102 98 98  --   --   --   --   --   CO2 28 27 27 25 25   --   --   --   --   --   GLUCOSE 97 94 93 97 94  --   --   --   --   --   BUN 12 9 8 6 10   --   --   --   --   --   CREATININE 0.75 0.73 0.79 0.70 0.79  --   --   --   --   --   CALCIUM 8.6* 8.7* 8.7* 8.4* 8.8*  --   --   --   --   --   MG 2.3 2.1 2.1 2.0 1.8  --   --   --   --   --  PHOS 3.9  --   --   --   --   --   --   --   --   --     Liver Function Tests: Recent Labs  Lab 12/15/20 0354  AST 16  ALT 18  ALKPHOS 76  BILITOT 0.5  PROT 6.9  ALBUMIN 3.3*   No results for input(s): LIPASE, AMYLASE in the last 168 hours. No results for input(s): AMMONIA in the last 168 hours.  CBC: Recent Labs  Lab 12/16/20 0341 12/16/20 0647 12/17/20 0010 12/18/20 0356 12/18/20 0958 12/18/20 1004 12/18/20 1005 12/18/20 1017 12/18/20 1024 12/19/20 0313  WBC 8.0 8.1 8.6 8.8  --   --   --   --   --  7.9  HGB 13.7 13.4 12.4 14.0   < > 10.5* 13.3 12.6 13.6 14.6  HCT 41.2 41.9 37.1 42.8   < > 31.0* 39.0 37.0 40.0 44.5  MCV 91.4 91.7 90.7 90.7  --   --   --   --   --  90.3  PLT 102* 96* 99* 86*  --   --   --   --   --  70*   < > = values in this interval not displayed.    Cardiac Enzymes: No results for input(s): CKTOTAL, CKMB, CKMBINDEX, TROPONINI in the last 168 hours.  BNP: BNP (last 3 results) Recent Labs    08/11/20 0230 12/14/20 1921  BNP 571.0*  209.0*    ProBNP (last 3 results) No results for input(s): PROBNP in the last 8760 hours.    Other results:  Imaging: CARDIAC CATHETERIZATION  Result Date: 12/18/2020  1st Diag lesion is 100% stenosed.  Mid LAD lesion is 45% stenosed.  Prox Cx to Mid Cx lesion is 95% stenosed.  Prox RCA lesion is 30% stenosed.  Findings: Ao = 125/65 (88) LV = 127/13 RA =  1 RV = 27/4 PA = 24/6 (14) PCW = 12 Fick cardiac output/index = 6.2/3.0 PVR = 0.3 WU Ao sat = 99% PA sat = 76%, 77% High SVC sat = 80% Assessment: 1. 2v CAD with 95% mid LCX and chronically occluded D1 2. EF 25% 3. Well compensated hemodynamics with high cardiac output. 4. No evidence of intracardiac shunting Plan/Discussion: Cath results very similar to cath report from 2008 though LCx is 95% stenosed (not 100% as previously reported). I reviewed cath with Dr. Excell Seltzer. Lcx is amenable to PCI but with dropping platelets and absolute need for warfarin and ASA with anti-phospholipid syndrome and recent DC-CV will defer PCI for now particularly in light of normal hstrop. Can reconsider in January or February when farther out from [facility] if platelets rebounded. [name], MD 10:35 AM     Medications:     Scheduled Medications: . amiodarone  400 mg Oral BID  . aspirin EC  81 mg Oral Daily  . carvedilol  6.25 mg Oral BID WC  . digoxin  0.0625 mg Oral Daily  . DULoxetine  30 mg Oral Daily  . DULoxetine  60 mg Oral QPM  . ezetimibe  10 mg Oral Daily  . famotidine  20 mg Oral Daily  . gabapentin  300 mg Oral Daily  . gabapentin  600 mg Oral QHS  . insulin aspart  0-5 Units Subcutaneous QHS  . insulin aspart  0-9 Units Subcutaneous TID WC  . multivitamin with minerals  1 tablet Oral Daily  . potassium chloride SA  20 mEq Oral Daily  .  sacubitril-valsartan  1 tablet Oral BID  . sodium chloride flush  3 mL Intravenous Q12H  . torsemide  80 mg Oral Daily  . Warfarin - Pharmacist Dosing Inpatient   Does not apply q1600     Infusions: . sodium chloride    . heparin 1,400 Units/hr (12/19/20 0049)    PRN Medications: sodium chloride, acetaminophen, methocarbamol, ondansetron (ZOFRAN) IV, oxyCODONE-acetaminophen, sodium chloride flush   Assessment/Plan:   1. VT with syncope - s/p ICD shock on 12/16 - now back in NSR. - Continue PO amiodarone 400 mg bid  - given intermittent chest pressure and known CAD there is question of ischemic trigger though hstrop flat - cath had been planned for 4 weeks after DC-CV on 12/3. Now off warfarin and on heparin --R/LHC done 12/20. Cath results very similar to cath report from 2008 though LCx is 95% stenosed (not 100% as previously reported). I reviewed cath with Dr. Excell Seltzer. Lcx is amenable to PCI but with dropping platelets and absolute need for warfarin and ASA with anti-phospholipid syndrome and recent DC-CV will defer PCI for now particularly in light of normal hstrop. Can reconsider in January or February when farther out from [facility] if platelets rebounded. She is currently CP free  - Keep K> 4.0 Mg > 2.0 (labs pending supp if needed) - No driving x 6 months  2. Chronic systolic HF due to [employer] ICD.  - Echo 8/13 EF 15-20%, anteroseptal AK, RV normal, severe LAE, mild MR.  - TEE 12/01/20 EF 25-30%, normal RV size with mildly decreased systolic function, moderate LAE, no LAA thrombus, mild RAE, moderate MR, mild TR  -NYHA class II symptoms at baseline - Volume status low on RHC yesterday, RAP 1, PCWP 12. Wt stable today  - Continue torsemide 80 mg daily  - Continue carvedilol 6.25 bid (decreased from 12.5 bid with low BP) - Continue Entresto 24/26 bid - Continue digoxin 0.125 - Spiro and dapaglifozin currently on hold. Resume if SCr on pending BMP ok   3. CAD:  -MI in 2008 in [facility], cath at the time showed totally occluded diagonal, totally occluded LCX, and diffuse LAD disease. No intervention.  - hstrop flat this admit - R/LHC done 12/20. Cath  results very similar to cath report from 2008 though LCx is 95% stenosed (not 100% as previously reported). I reviewed cath with Dr. Excell Seltzer. Lcx is amenable to PCI but with dropping platelets and absolute need for warfarin and ASA with anti-phospholipid syndrome and recent DC-CV will defer PCI for now particularly in light of normal hstrop. Can reconsider in January or February when farther out from [facility] if platelets rebounded.  - Currently stable w/o CP  - Continue ASA 81 daily.  - Continue Zetia, has not tolerated statins. LDL 78. Can consider repatha   4. Persistent Atrial fibrillation:  - Did not tolerate well - s/p TEE-guided DCCV on 12/3. Remains in NSR, Now on amio.  - back on coumadin w/ heparin bridge (no NOAC with anti-phospholipid syndrome) - INR 1.1 today, plan d/c home w/ Lovenox bridge   5. Frequent PVCs  - may be contributing to CM - now on amio (this is new) and burden decreased.  EP has seen  6. 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  - She is on ASA 81 and warfarin.  - ok with using lovenox post procedure to facilitate d/c as needed  8. Smoking: Active smoker. - Discussed need for cessation  9. PAD: She denies  claudication.  - Needs to quit smoking.   10. Thrombocytopenia - PLTs running 97-> 86->70K this admit. (Baseline ~120k). - Hgb stable at 15     Length of Stay: 4   Brittainy Delmer Islam  12/19/2020, 10:47 AM  Advanced Heart Failure Team Pager [phone] (M-F; 7a - 4p)  Please contact [facility] for night-coverage after hours (4p -7a ) and weekends on [email or web address]  Patient seen and examined with the above-signed Advanced Practice Provider and/or Housestaff. I personally reviewed laboratory data, imaging studies and relevant notes. I independently examined the patient and formulated the important aspects of the plan. I have edited the note to reflect any of my changes or salient points. I have personally discussed the plan with  the patient and/or family.  Feels much better today. Cath results reviewed. No further VT. PVCs suppressed on po amio.   General:  Well appearing. No resp difficulty HEENT: normal Neck: supple. no JVD. Carotids 2+ bilat; no bruits. No lymphadenopathy or thryomegaly appreciated. Cor: PMI nondisplaced. Regular rate & rhythm. No rubs, gallops or murmurs. Lungs: clear Abdomen: soft, nontender, nondistended. No hepatosplenomegaly. No bruits or masses. Good bowel sounds. Extremities: no cyanosis, clubbing, rash, edema Neuro: alert & orientedx3, cranial nerves grossly intact. moves all 4 extremities w/o difficulty. Affect pleasant  Ok for d/c today. PLTs slightly lower but no bleeding. Will d/c home on Lovenox for anti-phospholipid syndrome and PAF with recent DC-CV. Manage CAD medically for now. F/u Dr. Shirlee Latch next week.  [name], MD  4:19 PM

## 2020-12-19 NOTE — Plan of Care (Signed)

## 2020-12-19 NOTE — Progress Notes (Addendum)
D/C instructions given and reviewed with pt. Questions asked and answered but encouraged to call with any further concerns. Tele and IV removed, tolerated well.

## 2020-12-19 NOTE — Progress Notes (Addendum)
ANTICOAGULATION CONSULT NOTE  Pharmacy Consult for heparin + start Coumadin Indication: atrial fibrillation and antiphospholipid antibody syndrome  Patient Measurements: Weight: 93.9 kg (207 lb)  Vital Signs: Temp: 98.2 F (36.8 C) (12/21 0939) Temp Source: Oral (12/21 0939) BP: 96/58 (12/21 0939) Pulse Rate: 65 (12/21 0939)  Labs: Recent Labs    12/17/20 0010 12/17/20 0730 12/18/20 0356 12/18/20 0958 12/18/20 1017 12/18/20 1024 12/18/20 2054 12/18/20 2248 12/19/20 0313  HGB 12.4  --  14.0   < > 12.6 13.6  --   --  14.6  HCT 37.1  --  42.8   < > 37.0 40.0  --   --  44.5  PLT 99*  --  86*  --   --   --   --   --  70*  LABPROT 14.5  --   --   --   --   --  13.7  --  13.9  INR 1.2  --   --   --   --   --  1.1  --  1.1  HEPARINUNFRC 0.23*   < > 0.47  --   --   --   --  0.32 0.40  CREATININE 0.70  --  0.79  --   --   --   --   --   --    < > = values in this interval not displayed.    Estimated Creatinine Clearance: 88.8 mL/min (by C-G formula based on SCr of 0.79 mg/dL).  Assessment: 59 y.o. female with h/o Afib and antiphospholipid antibody syndrome, Coumadin on hold for heart cath and INR < 2.   Heparin level therapeutic 0.4 this AM.  No overt bleeding or complications noted.  Coumadin restarted.  Previous PTA dosing was 10 mg daily except 5 mg on MWF.  Goal of Therapy:  Heparin level 0.3-0.7 units/mL INR goal 2-3 Monitor platelets by anticoagulation protocol: Yes   Plan:  Continue IV heparin at current rate. Daily heparin level and CBC. Coumadin 12.5 mg po x 1 tonight. Daily INR. Discussed with Dr. Gala Romney, could bridge with Lovenox at discharge, would need Lovenox 100 mg sq q 12 hrs until next INR check.  Reece Leader, Colon Flattery, BCCP Clinical Pharmacist  12/19/2020 10:50 AM   [facility] pharmacy phone numbers are listed on [email or web address]

## 2020-12-25 ENCOUNTER — Other Ambulatory Visit: Payer: Self-pay

## 2020-12-25 ENCOUNTER — Ambulatory Visit (HOSPITAL_COMMUNITY)
Admission: RE | Admit: 2020-12-25 | Discharge: 2020-12-25 | Disposition: A | Discharge status: Home / Self Care | Payer: Medicare PPO | Source: Ambulatory Visit | Attending: Cardiology | Admitting: Cardiology

## 2020-12-25 ENCOUNTER — Encounter (HOSPITAL_COMMUNITY): Payer: Self-pay | Admitting: Cardiology

## 2020-12-25 VITALS — BP 120/80 | HR 75 | Wt 207.2 lb

## 2020-12-25 DIAGNOSIS — I739 Peripheral vascular disease, unspecified: Secondary | ICD-10-CM | POA: Diagnosis not present

## 2020-12-25 DIAGNOSIS — I252 Old myocardial infarction: Secondary | ICD-10-CM | POA: Insufficient documentation

## 2020-12-25 DIAGNOSIS — Z79899 Other long term (current) drug therapy: Secondary | ICD-10-CM | POA: Diagnosis not present

## 2020-12-25 DIAGNOSIS — I4819 Other persistent atrial fibrillation: Secondary | ICD-10-CM | POA: Diagnosis not present

## 2020-12-25 DIAGNOSIS — I340 Nonrheumatic mitral (valve) insufficiency: Secondary | ICD-10-CM | POA: Diagnosis not present

## 2020-12-25 DIAGNOSIS — I6529 Occlusion and stenosis of unspecified carotid artery: Secondary | ICD-10-CM | POA: Diagnosis not present

## 2020-12-25 DIAGNOSIS — I2510 Atherosclerotic heart disease of native coronary artery without angina pectoris: Secondary | ICD-10-CM | POA: Diagnosis not present

## 2020-12-25 DIAGNOSIS — I255 Ischemic cardiomyopathy: Secondary | ICD-10-CM

## 2020-12-25 DIAGNOSIS — I110 Hypertensive heart disease with heart failure: Secondary | ICD-10-CM | POA: Diagnosis not present

## 2020-12-25 DIAGNOSIS — Z8249 Family history of ischemic heart disease and other diseases of the circulatory system: Secondary | ICD-10-CM | POA: Insufficient documentation

## 2020-12-25 DIAGNOSIS — Z7982 Long term (current) use of aspirin: Secondary | ICD-10-CM | POA: Diagnosis not present

## 2020-12-25 DIAGNOSIS — Z7984 Long term (current) use of oral hypoglycemic drugs: Secondary | ICD-10-CM | POA: Insufficient documentation

## 2020-12-25 DIAGNOSIS — D696 Thrombocytopenia, unspecified: Secondary | ICD-10-CM

## 2020-12-25 DIAGNOSIS — F17200 Nicotine dependence, unspecified, uncomplicated: Secondary | ICD-10-CM | POA: Insufficient documentation

## 2020-12-25 DIAGNOSIS — Z8673 Personal history of transient ischemic attack (TIA), and cerebral infarction without residual deficits: Secondary | ICD-10-CM | POA: Insufficient documentation

## 2020-12-25 DIAGNOSIS — I5022 Chronic systolic (congestive) heart failure: Secondary | ICD-10-CM

## 2020-12-25 DIAGNOSIS — D6861 Antiphospholipid syndrome: Secondary | ICD-10-CM | POA: Diagnosis not present

## 2020-12-25 DIAGNOSIS — Z7901 Long term (current) use of anticoagulants: Secondary | ICD-10-CM | POA: Diagnosis not present

## 2020-12-25 DIAGNOSIS — R42 Dizziness and giddiness: Secondary | ICD-10-CM | POA: Insufficient documentation

## 2020-12-25 LAB — COMPREHENSIVE METABOLIC PANEL
ALT: 63 U/L — ABNORMAL HIGH (ref 0–44)
AST: 61 U/L — ABNORMAL HIGH (ref 15–41)
Albumin: 3.6 g/dL (ref 3.5–5.0)
Alkaline Phosphatase: 71 U/L (ref 38–126)
Anion gap: 10 (ref 5–15)
BUN: 14 mg/dL (ref 6–20)
CO2: 27 mmol/L (ref 22–32)
Calcium: 8.9 mg/dL (ref 8.9–10.3)
Chloride: 101 mmol/L (ref 98–111)
Creatinine, Ser: 0.93 mg/dL (ref 0.44–1.00)
GFR, Estimated: >60 mL/min (ref >60)
Glucose, Bld: 105 mg/dL — ABNORMAL HIGH (ref 70–99)
Potassium: 4 mmol/L (ref 3.5–5.1)
Sodium: 138 mmol/L (ref 135–145)
Total Bilirubin: 0.6 mg/dL (ref 0.3–1.2)
Total Protein: 7.3 g/dL (ref 6.5–8.1)

## 2020-12-25 LAB — CBC
HCT: 42.5 % (ref 36.0–46.0)
Hemoglobin: 13.9 g/dL (ref 12.0–15.0)
MCH: 29.6 pg (ref 26.0–34.0)
MCHC: 32.7 g/dL (ref 30.0–36.0)
MCV: 90.6 fL (ref 80.0–100.0)
Platelets: 148 10*3/uL — ABNORMAL LOW (ref 150–400)
RBC: 4.69 MIL/uL (ref 3.87–5.11)
RDW: 13.2 % (ref 11.5–15.5)
WBC: 8.4 10*3/uL (ref 4.0–10.5)
nRBC: 0 % (ref 0.0–0.2)

## 2020-12-25 LAB — PROTIME-INR
INR: 3.2 — ABNORMAL HIGH (ref 0.8–1.2)
Prothrombin Time: 31.7 seconds — ABNORMAL HIGH (ref 11.4–15.2)

## 2020-12-25 LAB — DIGOXIN LEVEL: Digoxin Level: 1 ng/mL (ref 0.8–2.0)

## 2020-12-25 MED ORDER — AMIODARONE HCL 200 MG PO TABS: 200.0000 mg | ORAL_TABLET | Freq: Every day | ORAL | 3 refills | Status: DC

## 2020-12-25 NOTE — Patient Instructions (Addendum)
DECREASE Amiodarone 200mg  (1 tablet) twice daily for a week   Then DECREASE 200mg  (1 tablet) Daily after that  Our pharmacists are our of the office today. They will give you a call next week to answer any questions or concerns you have regarding . Samples have been sent with you.   Labs done today, your results will be available in MyChart, we will contact you for abnormal readings.  You have been referred to Hematology/Oncology at [facility]. Their office will contact you to schedule an appointment  Your physician recommends that you schedule a follow-up appointment in: 1 month  If you have any questions or concerns before your next appointment please send [place] a message through [place] or call our office at [phone].    TO LEAVE A MESSAGE FOR THE NURSE SELECT OPTION 2, PLEASE LEAVE A MESSAGE INCLUDING:  YOUR NAME  DATE OF BIRTH  CALL BACK NUMBER  REASON FOR CALL**this is important as we prioritize the call backs  YOU WILL RECEIVE A CALL BACK THE SAME DAY AS LONG AS YOU CALL BEFORE 4:00 PM

## 2020-12-25 NOTE — Progress Notes (Signed)
PCP: Ignatius Specking, MD Cardiology: Dr. Diona Browner HF Cardiology: Dr. Shirlee Latch  59 y.o. with history of CVA, anti-phospholipid antibody syndrome, CAD, ischemic cardiomyopathy, and persistent atrial fibrillation was referred by Dr. Diona Browner for evaluation of CHF. Patient had a cath in 2008 in setting of an MI in [place].  Diagonal and LCx were totally occluded, there was diffuse LAD disease.  She has not had a cath since that time. She has a Environmental manager ICD.  Echo in 8/21 showed EF 15-20%, anteroseptal akinesis, normal RV, severe LAE, mild MR. She was admitted in 8/21 with CHF exacerbation for 6 days.  She went into atrial fibrillation in 11/21.   In 12/21, she had TEE-guided DCCV back to NSR.  TEE showed EF 25-30%, mildly decreased RV systolic function, moderate MR.    Later in 12/21, she was admitted with syncope and VT with ICD shock.  RHC/LHC showed chronically occluded D1 and 95% proximal LCx; normal filling pressures with CI 3.0. Platelets were low, no intervention was done. Amiodarone was started.   She returns for followup of CHF.  She has lightheadedness if she stands up too fast, has to stand slowly.  BP today is high for her, normally around 100 systolic.  No palpitations/syncope.  No chest pain.  No orthopnea/PND.  No significant dyspnea currently.   ECG (personally reviewed): NSR, nonspecific T wave flattening  Labs (8/21): K 4.4, creatinine 0.58, LDL 94  Labs (12/21): LDL 72, HDL 31, TGs 85, K 4.4, creatinine 0.8, hgb 14.6, plts 70K  PMH: 1. Brain aneurysm: Followed by Dr. Corliss Skains.  2. HTN 3. CVA: Total occlusion of RICA.  - Carotid dopplers (12/21): Occluded RICA, < 50% stenosis LICA.  4. Anti-phospholipid antibody syndrome: Diagnosed at [facility].  - On warfarin and ASA 81 given history of CVA.  5. Remote cocaine abuse.  6. PAD: peripheral arterial dopplers in 3/21 with early left-sided iliac disease, right anterior tibial disease; ABI 1.0 on right and 0.75 on left.  7.  Active smoker.  8. L-spine arthritis.  9. CAD: MI in 2008 in [facility], cath at the time showed totally occluded diagonal, totally occluded LCX, and diffuse LAD disease.  No intervention.   - LHC (12/21): chronically occluded D1, 45% mid LAD, 95% proximal LCx.  10. Atrial fibrillation: Persistent, started in 11/21.  - TEE-DCCV back to NSR in 12/21.  11. Chronic systolic CHF: Ischemic cardiomyopathy.  [employer] ICD.  - Echo (8/21): EF 15-20%, anteroseptal AK, RV normal, severe LAE, mild MR.  - TEE (12/21): EF 25-30%, mildly decreased RV systolic function, moderate MR with ERO 0.26 cm^2 (probably functional).   - RHC (12/21): mean RA 1, PA 24/6, mean PCWP 12, CI 3.0 12. Hyperlipidemia: Statin intolerant.  13. Chronic thrombocytopenia: Fluctuates.  14. Mitral regurgitation: Moderate on 12/21 TEE.   FH: Father, grandfather with MIs.  Brother with MI in his 79s.   SH: Lives in [place], on disability, smokes 4 cigs/day.  Prior cocaine.  No ETOH.   ROS: All systems reviewed and negative except as per HPI.   Current Outpatient Medications  Medication Sig Dispense Refill  . aspirin EC 81 MG tablet Take 81 mg by mouth daily. Swallow whole.    . carvedilol (COREG) 6.25 MG tablet Take 1 tablet (6.25 mg total) by mouth 2 (two) times daily with a meal. 60 tablet 5  . dapagliflozin propanediol (FARXIGA) 10 MG TABS tablet Take 1 tablet (10 mg total) by mouth daily before breakfast. 30 tablet 6  .  digoxin (LANOXIN) 0.125 MG tablet Take 0.125 mg by mouth daily.    . DULoxetine (CYMBALTA) 30 MG capsule Take 30 mg by mouth daily.    . DULoxetine (CYMBALTA) 60 MG capsule Take 60 mg by mouth every evening.    . enoxaparin (LOVENOX) 100 MG/ML injection Inject 1 mL (100 mg total) into the skin 2 (two) times daily for 20 doses. 20 mL 0  . ezetimibe (ZETIA) 10 MG tablet Take 1 tablet (10 mg total) by mouth daily. 30 tablet 3  . famotidine (PEPCID) 20 MG tablet Take 1 tablet (20 mg total) by mouth 2 (two) times  daily. (Patient taking differently: Take 20 mg by mouth daily.) 60 tablet 1  . fluticasone (FLONASE) 50 MCG/ACT nasal spray Place 1 spray into both nostrils daily as needed for allergies.     Marland Kitchen gabapentin (NEURONTIN) 300 MG capsule Take 300 mg by mouth See admin instructions. Take 300 mg  in the AM and 600 mg  in the PM    . Multiple Vitamins-Minerals (MULTIVITAMINS THER. W/MINERALS) TABS Take 1 tablet by mouth daily.      . nitroGLYCERIN (NITROSTAT) 0.4 MG SL tablet PLACE 1 TABLET UNDER THE TONGUE EVERY 5 MINUTES FOR 3 DOSES AS NEEDED CHEST PAIN (Patient taking differently: Place 0.4 mg under the tongue every 5 (five) minutes as needed for chest pain.) 25 tablet 3  . oxyCODONE-acetaminophen (PERCOCET) 10-325 MG tablet Take 1 tablet by mouth every 4 (four) hours as needed for pain.    . potassium chloride SA (KLOR-CON) 20 MEQ tablet TAKE 1 TABLET BY MOUTH EVERY DAY 90 tablet 1  . promethazine (PHENERGAN) 25 MG tablet Take 25 mg by mouth every 6 (six) hours as needed for nausea. With pain medication    . sacubitril-valsartan (ENTRESTO) 24-26 MG Take 1 tablet by mouth 2 (two) times daily. 180 tablet 3  . spironolactone (ALDACTONE) 25 MG tablet Take 1 tablet (25 mg total) by mouth daily. 30 tablet 3  . torsemide (DEMADEX) 20 MG tablet Take 4 tablets (80 mg total) by mouth daily. May take extra tablet as needed 135 tablet 3  . warfarin (COUMADIN) 10 MG tablet TAKE 1 TABLET BY MOUTH EVERY DAY EXCEPT 1/2 TABLET ON MONDAYS AND THURSDAYS (Patient taking differently: Take 10 mg by mouth See admin instructions. Take 5 mg Mon. Wed, Friday , and Saturday All the other days take 10 mg in the evening) 30 tablet 3  . amiodarone (PACERONE) 200 MG tablet Take 1 tablet (200 mg total) by mouth daily. 30 tablet 3   No current facility-administered medications for this encounter.   BP 120/80   Pulse 75   Wt 94 kg (207 lb 3.2 oz)   SpO2 96%   BMI 33.96 kg/m  General: NAD Neck: No JVD, no thyromegaly or thyroid  nodule.  Lungs: Clear to auscultation bilaterally with normal respiratory effort. CV: Nondisplaced PMI.  Heart regular S1/S2, no S3/S4, 1/6 HSM apex.  No peripheral edema.  No carotid bruit.  Normal pedal pulses.  Abdomen: Soft, nontender, no hepatosplenomegaly, no distention.  Skin: Intact without lesions or rashes.  Neurologic: Alert and oriented x 3.  Psych: Normal affect. Extremities: No clubbing or cyanosis.  HEENT: Normal.   Assessment/Plan: 1. CAD: MI in 2008 in [facility], cath at the time showed totally occluded diagonal, totally occluded LCX, and diffuse LAD disease.  No intervention. LHC was done in 12/21 due to VT, this showed chronically occluded D1 and 95% proximal LCx.  No  intervention as she did not have chest pain and platelets were low.  - Continue ASA 81 daily.  - Continue Zetia, has not tolerated statins.  Good lipids in 12/21.  - I will recheck CBC today.  If platelets are improved, would plan on setting LCx PCI at followup appt in about 1 month. 2. Atrial fibrillation: TEE-guided DCCV to NSR in 12/21.  She is in NSR today.   - If atrial fibrillation recurs, will need to consider Tikosyn versus ablation.  - Continue warfarin, check INR today (to see if she can stop Lovenox).   3. Carotid stenosis: Known RICA occlusion. Dopplers in 12/21 with <50% LICA.  - Repeat dopplers in 12/22.  4. 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  - She is on ASA 81 and warfarin.  5. Chronic systolic CHF: Ischemic cardiomyopathy.  [employer] ICD.  Echo (8/21) with EF 15-20%, anteroseptal AK, RV normal, severe LAE, mild MR.  TEE (12/21) with EF 25-30%.  RHC in 12/21 with preserved cardiac output and optimized filling pressures.  NYHA class II symptoms, not volume overloaded on exam.  She has mild orthostatic symptoms.  I do not think that she has BP room to increase Entresto or Coreg.  - Continue Entresto 24/26 bid. - Continue Farxiga 10 mg daily.   - Continue spironolactone 25  mg daily.  - Continue Coreg 6.25 mg bid.  - Continue torsemide 80 mg daily, check BMET.  - Continue digoxin 0.125 daily, check digoxin level.  6. Smoking: Active smoker, wants to stop on her own.  7. PAD: She denies claudication.  - Needs to quit smoking.  8. Thrombocytopenia: Chronic and mild.  Down to 70K during last hospitalization.  - Repeat CBC today.  - Needs hematology followup, will try to arrange for [place].  9. Mitral regurgitation: Moderate on 12/21 TEE, suspect functional.  10. VT: Suspect scar-mediated in 12/21 with ICD discharge and syncope.  - No driving x 6 months.  - Decrease amiodarone to 200 mg bid x 1 week then 200 mg daily. Check LFTs and TSH today, will need regular eye exam.   Followup with me in 1 month.    [name] 12/25/2020

## 2020-12-25 NOTE — Progress Notes (Signed)
Medication Samples have been provided to the patient.  Drug name: [name]       Strength: 10mg         Qty: 2  LOT            Exp.Date: 7/24  Dosing instructions: One tablet daily  The patient has been instructed regarding the correct time, dose, and frequency of taking this medication, including desired effects and most common side effects.   8/24 Cheney Ewart 2:32 PM 12/25/2020

## 2020-12-26 ENCOUNTER — Telehealth (HOSPITAL_COMMUNITY): Payer: Self-pay

## 2020-12-26 ENCOUNTER — Encounter (HOSPITAL_COMMUNITY): Payer: Self-pay

## 2020-12-26 DIAGNOSIS — I5022 Chronic systolic (congestive) heart failure: Secondary | ICD-10-CM

## 2020-12-26 NOTE — Telephone Encounter (Signed)
Patient advised and verbalized understanding. Lab appt scheduled,lab order entered.  Orders Placed This Encounter  Procedures   Digoxin level    Standing Status:   Future    Standing Expiration Date:   12/26/2021    Order Specific Question:   Release to patient    Answer:   Immediate   Basic Metabolic Panel (BMET)    Standing Status:   Future    Standing Expiration Date:   12/26/2021    Order Specific Question:   Release to patient    Answer:   Immediate

## 2020-12-26 NOTE — Telephone Encounter (Signed)
-----   Message from [name], MD sent at 12/25/2020  9:39 PM EST ----- Mildly elevated AST and ALT, mildly elevated digoxin level.  Repeat CMET in 10 days. Also get digoxin level at that time as a trough (before am digoxin dose taken).

## 2020-12-27 ENCOUNTER — Ambulatory Visit (INDEPENDENT_AMBULATORY_CARE_PROVIDER_SITE_OTHER): Payer: Medicare PPO | Admitting: Pharmacist

## 2020-12-27 DIAGNOSIS — Z5181 Encounter for therapeutic drug level monitoring: Secondary | ICD-10-CM | POA: Diagnosis not present

## 2020-12-27 DIAGNOSIS — D6861 Antiphospholipid syndrome: Secondary | ICD-10-CM | POA: Diagnosis not present

## 2020-12-27 DIAGNOSIS — I679 Cerebrovascular disease, unspecified: Secondary | ICD-10-CM | POA: Diagnosis not present

## 2020-12-27 NOTE — Patient Instructions (Signed)
Description   Continue warfarin 1/2 tablet daily except 1 tablets on Sundays, Tuesdays and Thursdays Has been diagnosed with Alpha Gal.   Keep greens consistant Recheck in 3 weeks

## 2020-12-27 NOTE — Progress Notes (Signed)
Left message on machine (cell)  No answer on home number

## 2021-01-01 ENCOUNTER — Ambulatory Visit: Payer: Medicare PPO | Admitting: Cardiology

## 2021-01-01 DIAGNOSIS — F419 Anxiety disorder, unspecified: Secondary | ICD-10-CM | POA: Diagnosis not present

## 2021-01-01 DIAGNOSIS — D6869 Other thrombophilia: Secondary | ICD-10-CM | POA: Diagnosis not present

## 2021-01-01 DIAGNOSIS — Z299 Encounter for prophylactic measures, unspecified: Secondary | ICD-10-CM | POA: Diagnosis not present

## 2021-01-01 DIAGNOSIS — F32A Depression, unspecified: Secondary | ICD-10-CM | POA: Diagnosis not present

## 2021-01-01 DIAGNOSIS — F17210 Nicotine dependence, cigarettes, uncomplicated: Secondary | ICD-10-CM | POA: Diagnosis not present

## 2021-01-01 DIAGNOSIS — I5022 Chronic systolic (congestive) heart failure: Secondary | ICD-10-CM | POA: Diagnosis not present

## 2021-01-03 ENCOUNTER — Telehealth (HOSPITAL_COMMUNITY): Payer: Self-pay | Admitting: Pharmacy Technician

## 2021-01-03 ENCOUNTER — Other Ambulatory Visit (HOSPITAL_COMMUNITY): Payer: Self-pay

## 2021-01-03 MED ORDER — EMPAGLIFLOZIN 10 MG PO TABS: 10.0000 mg | ORAL_TABLET | Freq: Every day | ORAL | 3 refills | Status: DC

## 2021-01-03 NOTE — Telephone Encounter (Signed)
I received a message that the patient needed help affording her [place]. Her current [name] co-pay is $220 and [place] is $52.   Asked [name] Folks, RN to send in [place] and ask for it to be placed on hold until the patient called in for a refill.   Called and spoke with the patient about the change. She is aware to continue taking the Farxiga samples that were provided to her. She will start taking Jardiance the day after she finishes the [place].   Patient understood. Advised her to call in the future if the medication becomes unaffordable.  Archer Asa, CPhT

## 2021-01-04 ENCOUNTER — Ambulatory Visit (INDEPENDENT_AMBULATORY_CARE_PROVIDER_SITE_OTHER): Payer: Medicare PPO | Admitting: *Deleted

## 2021-01-04 ENCOUNTER — Inpatient Hospital Stay (HOSPITAL_COMMUNITY)
Admission: EM | Admit: 2021-01-04 | Discharge: 2021-01-09 | DRG: 247 | Disposition: A | Discharge status: Home / Self Care | Payer: Medicare PPO | Attending: Cardiology | Admitting: Cardiology

## 2021-01-04 ENCOUNTER — Emergency Department (HOSPITAL_COMMUNITY): Payer: Medicare PPO

## 2021-01-04 ENCOUNTER — Telehealth: Payer: Self-pay | Admitting: Physician Assistant

## 2021-01-04 ENCOUNTER — Encounter (HOSPITAL_COMMUNITY): Payer: Self-pay

## 2021-01-04 ENCOUNTER — Other Ambulatory Visit: Payer: Self-pay

## 2021-01-04 ENCOUNTER — Telehealth: Payer: Self-pay

## 2021-01-04 DIAGNOSIS — F1411 Cocaine abuse, in remission: Secondary | ICD-10-CM | POA: Diagnosis present

## 2021-01-04 DIAGNOSIS — Z4502 Encounter for adjustment and management of automatic implantable cardiac defibrillator: Secondary | ICD-10-CM | POA: Diagnosis not present

## 2021-01-04 DIAGNOSIS — F17210 Nicotine dependence, cigarettes, uncomplicated: Secondary | ICD-10-CM | POA: Diagnosis present

## 2021-01-04 DIAGNOSIS — Z800 Family history of malignant neoplasm of digestive organs: Secondary | ICD-10-CM

## 2021-01-04 DIAGNOSIS — Z7982 Long term (current) use of aspirin: Secondary | ICD-10-CM

## 2021-01-04 DIAGNOSIS — Z91014 Allergy to mammalian meats: Secondary | ICD-10-CM | POA: Diagnosis not present

## 2021-01-04 DIAGNOSIS — Z8673 Personal history of transient ischemic attack (TIA), and cerebral infarction without residual deficits: Secondary | ICD-10-CM

## 2021-01-04 DIAGNOSIS — D696 Thrombocytopenia, unspecified: Secondary | ICD-10-CM | POA: Diagnosis not present

## 2021-01-04 DIAGNOSIS — Z7901 Long term (current) use of anticoagulants: Secondary | ICD-10-CM

## 2021-01-04 DIAGNOSIS — I6521 Occlusion and stenosis of right carotid artery: Secondary | ICD-10-CM | POA: Diagnosis present

## 2021-01-04 DIAGNOSIS — Z79899 Other long term (current) drug therapy: Secondary | ICD-10-CM

## 2021-01-04 DIAGNOSIS — I517 Cardiomegaly: Secondary | ICD-10-CM | POA: Diagnosis not present

## 2021-01-04 DIAGNOSIS — Z880 Allergy status to penicillin: Secondary | ICD-10-CM

## 2021-01-04 DIAGNOSIS — I5042 Chronic combined systolic (congestive) and diastolic (congestive) heart failure: Secondary | ICD-10-CM | POA: Diagnosis present

## 2021-01-04 DIAGNOSIS — I7300 Raynaud's syndrome without gangrene: Secondary | ICD-10-CM | POA: Diagnosis present

## 2021-01-04 DIAGNOSIS — Z95810 Presence of automatic (implantable) cardiac defibrillator: Secondary | ICD-10-CM

## 2021-01-04 DIAGNOSIS — Z20822 Contact with and (suspected) exposure to covid-19: Secondary | ICD-10-CM | POA: Diagnosis present

## 2021-01-04 DIAGNOSIS — Z8049 Family history of malignant neoplasm of other genital organs: Secondary | ICD-10-CM

## 2021-01-04 DIAGNOSIS — Z888 Allergy status to other drugs, medicaments and biological substances status: Secondary | ICD-10-CM | POA: Diagnosis not present

## 2021-01-04 DIAGNOSIS — I493 Ventricular premature depolarization: Secondary | ICD-10-CM | POA: Diagnosis present

## 2021-01-04 DIAGNOSIS — I671 Cerebral aneurysm, nonruptured: Secondary | ICD-10-CM | POA: Diagnosis present

## 2021-01-04 DIAGNOSIS — I110 Hypertensive heart disease with heart failure: Secondary | ICD-10-CM | POA: Diagnosis present

## 2021-01-04 DIAGNOSIS — Z8674 Personal history of sudden cardiac arrest: Secondary | ICD-10-CM

## 2021-01-04 DIAGNOSIS — E782 Mixed hyperlipidemia: Secondary | ICD-10-CM | POA: Diagnosis present

## 2021-01-04 DIAGNOSIS — Z5181 Encounter for therapeutic drug level monitoring: Secondary | ICD-10-CM

## 2021-01-04 DIAGNOSIS — I255 Ischemic cardiomyopathy: Secondary | ICD-10-CM | POA: Diagnosis present

## 2021-01-04 DIAGNOSIS — D6861 Antiphospholipid syndrome: Secondary | ICD-10-CM

## 2021-01-04 DIAGNOSIS — I739 Peripheral vascular disease, unspecified: Secondary | ICD-10-CM | POA: Diagnosis not present

## 2021-01-04 DIAGNOSIS — I2510 Atherosclerotic heart disease of native coronary artery without angina pectoris: Secondary | ICD-10-CM | POA: Diagnosis present

## 2021-01-04 DIAGNOSIS — I4819 Other persistent atrial fibrillation: Secondary | ICD-10-CM | POA: Diagnosis present

## 2021-01-04 DIAGNOSIS — I252 Old myocardial infarction: Secondary | ICD-10-CM | POA: Diagnosis not present

## 2021-01-04 DIAGNOSIS — D6859 Other primary thrombophilia: Secondary | ICD-10-CM | POA: Diagnosis not present

## 2021-01-04 DIAGNOSIS — Z8249 Family history of ischemic heart disease and other diseases of the circulatory system: Secondary | ICD-10-CM | POA: Diagnosis not present

## 2021-01-04 DIAGNOSIS — D693 Immune thrombocytopenic purpura: Secondary | ICD-10-CM | POA: Diagnosis present

## 2021-01-04 DIAGNOSIS — I63549 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: Secondary | ICD-10-CM

## 2021-01-04 DIAGNOSIS — R072 Precordial pain: Secondary | ICD-10-CM

## 2021-01-04 DIAGNOSIS — I472 Ventricular tachycardia, unspecified: Secondary | ICD-10-CM

## 2021-01-04 DIAGNOSIS — I340 Nonrheumatic mitral (valve) insufficiency: Secondary | ICD-10-CM | POA: Diagnosis present

## 2021-01-04 DIAGNOSIS — I2582 Chronic total occlusion of coronary artery: Secondary | ICD-10-CM | POA: Diagnosis present

## 2021-01-04 DIAGNOSIS — Z720 Tobacco use: Secondary | ICD-10-CM | POA: Diagnosis not present

## 2021-01-04 LAB — BASIC METABOLIC PANEL
Anion gap: 12 (ref 5–15)
BUN: 15 mg/dL (ref 6–20)
CO2: 25 mmol/L (ref 22–32)
Calcium: 9 mg/dL (ref 8.9–10.3)
Chloride: 95 mmol/L — ABNORMAL LOW (ref 98–111)
Creatinine, Ser: 0.85 mg/dL (ref 0.44–1.00)
GFR, Estimated: >60 mL/min (ref >60)
Glucose, Bld: 92 mg/dL (ref 70–99)
Potassium: 3.9 mmol/L (ref 3.5–5.1)
Sodium: 132 mmol/L — ABNORMAL LOW (ref 135–145)

## 2021-01-04 LAB — CBC
HCT: 40.5 % (ref 36.0–46.0)
Hemoglobin: 13.3 g/dL (ref 12.0–15.0)
MCH: 30.6 pg (ref 26.0–34.0)
MCHC: 32.8 g/dL (ref 30.0–36.0)
MCV: 93.1 fL (ref 80.0–100.0)
Platelets: 214 10*3/uL (ref 150–400)
RBC: 4.35 MIL/uL (ref 3.87–5.11)
RDW: 13.5 % (ref 11.5–15.5)
WBC: 10.2 10*3/uL (ref 4.0–10.5)
nRBC: 0 % (ref 0.0–0.2)

## 2021-01-04 LAB — PROTIME-INR
INR: 3.4 — ABNORMAL HIGH (ref 0.8–1.2)
Prothrombin Time: 33.1 seconds — ABNORMAL HIGH (ref 11.4–15.2)

## 2021-01-04 LAB — TROPONIN I (HIGH SENSITIVITY)
Troponin I (High Sensitivity): 19 ng/L — ABNORMAL HIGH (ref <18)
Troponin I (High Sensitivity): 17 ng/L (ref <18)

## 2021-01-04 LAB — POCT INR: INR: 4.7 — AB (ref 2.0–3.0)

## 2021-01-04 MED ORDER — ACETAMINOPHEN 325 MG PO TABS: 650.0000 mg | ORAL_TABLET | ORAL | Status: DC | PRN

## 2021-01-04 MED ORDER — SACUBITRIL-VALSARTAN 24-26 MG PO TABS
1.0000 | ORAL_TABLET | Freq: Two times a day (BID) | ORAL | Status: DC
  Administered 2021-01-05 – 2021-01-09 (×10): 1 via ORAL
  Filled 2021-01-04 (×10): qty 1

## 2021-01-04 MED ORDER — TORSEMIDE 20 MG PO TABS
80.0000 mg | ORAL_TABLET | Freq: Every day | ORAL | Status: DC
  Administered 2021-01-05 – 2021-01-09 (×4): 80 mg via ORAL
  Filled 2021-01-04 (×5): qty 4

## 2021-01-04 MED ORDER — DULOXETINE HCL 30 MG PO CPEP
30.0000 mg | ORAL_CAPSULE | Freq: Every day | ORAL | Status: DC
  Administered 2021-01-05 – 2021-01-09 (×5): 30 mg via ORAL
  Filled 2021-01-04 (×5): qty 1

## 2021-01-04 MED ORDER — ALPRAZOLAM 0.25 MG PO TABS: 0.2500 mg | ORAL_TABLET | Freq: Two times a day (BID) | ORAL | Status: DC | PRN

## 2021-01-04 MED ORDER — GABAPENTIN 300 MG PO CAPS
600.0000 mg | ORAL_CAPSULE | Freq: Every day | ORAL | Status: DC
  Administered 2021-01-05 – 2021-01-08 (×5): 600 mg via ORAL
  Filled 2021-01-04 (×5): qty 2

## 2021-01-04 MED ORDER — ASPIRIN 81 MG PO CHEW
324.0000 mg | CHEWABLE_TABLET | ORAL | Status: AC
  Administered 2021-01-04: 324 mg via ORAL

## 2021-01-04 MED ORDER — AMIODARONE HCL 200 MG PO TABS: 200.0000 mg | ORAL_TABLET | Freq: Every day | ORAL | Status: DC

## 2021-01-04 MED ORDER — ASPIRIN 300 MG RE SUPP: 300.0000 mg | RECTAL | Status: AC

## 2021-01-04 MED ORDER — POTASSIUM CHLORIDE CRYS ER 20 MEQ PO TBCR
20.0000 meq | EXTENDED_RELEASE_TABLET | Freq: Every day | ORAL | Status: DC
  Administered 2021-01-05 – 2021-01-09 (×5): 20 meq via ORAL
  Filled 2021-01-04 (×5): qty 1

## 2021-01-04 MED ORDER — ASPIRIN EC 81 MG PO TBEC
81.0000 mg | DELAYED_RELEASE_TABLET | Freq: Every day | ORAL | Status: DC
  Administered 2021-01-05 – 2021-01-09 (×4): 81 mg via ORAL
  Filled 2021-01-04 (×4): qty 1

## 2021-01-04 MED ORDER — EZETIMIBE 10 MG PO TABS
10.0000 mg | ORAL_TABLET | Freq: Every day | ORAL | Status: DC
  Administered 2021-01-05 – 2021-01-09 (×5): 10 mg via ORAL
  Filled 2021-01-04 (×5): qty 1

## 2021-01-04 MED ORDER — DULOXETINE HCL 60 MG PO CPEP
60.0000 mg | ORAL_CAPSULE | Freq: Every evening | ORAL | Status: DC
Start: 2021-01-05 — End: 2021-01-09
  Administered 2021-01-05 – 2021-01-08 (×4): 60 mg via ORAL
  Filled 2021-01-04 (×5): qty 1

## 2021-01-04 MED ORDER — ZOLPIDEM TARTRATE 5 MG PO TABS
5.0000 mg | ORAL_TABLET | Freq: Every evening | ORAL | Status: DC | PRN
  Administered 2021-01-07: 5 mg via ORAL
  Filled 2021-01-04 (×2): qty 1

## 2021-01-04 MED ORDER — OXYCODONE HCL 5 MG PO TABS
5.0000 mg | ORAL_TABLET | ORAL | Status: DC | PRN
  Administered 2021-01-05 – 2021-01-09 (×19): 5 mg via ORAL
  Filled 2021-01-04 (×19): qty 1

## 2021-01-04 MED ORDER — FAMOTIDINE 20 MG PO TABS
20.0000 mg | ORAL_TABLET | Freq: Every day | ORAL | Status: DC
  Administered 2021-01-05 – 2021-01-09 (×5): 20 mg via ORAL
  Filled 2021-01-04 (×5): qty 1

## 2021-01-04 MED ORDER — DAPAGLIFLOZIN PROPANEDIOL 10 MG PO TABS
10.0000 mg | ORAL_TABLET | Freq: Every day | ORAL | Status: DC
  Administered 2021-01-05 – 2021-01-09 (×4): 10 mg via ORAL
  Filled 2021-01-04 (×5): qty 1

## 2021-01-04 MED ORDER — OXYCODONE-ACETAMINOPHEN 10-325 MG PO TABS: 1.0000 | ORAL_TABLET | ORAL | Status: DC | PRN

## 2021-01-04 MED ORDER — CARVEDILOL 6.25 MG PO TABS
6.2500 mg | ORAL_TABLET | Freq: Two times a day (BID) | ORAL | Status: DC
Start: 2021-01-05 — End: 2021-01-09
  Administered 2021-01-05 – 2021-01-09 (×8): 6.25 mg via ORAL
  Filled 2021-01-04 (×5): qty 1
  Filled 2021-01-04: qty 2
  Filled 2021-01-04 (×3): qty 1

## 2021-01-04 MED ORDER — LORAZEPAM 0.5 MG PO TABS: 0.5000 mg | ORAL_TABLET | Freq: Three times a day (TID) | ORAL | Status: DC | PRN

## 2021-01-04 MED ORDER — SPIRONOLACTONE 25 MG PO TABS
25.0000 mg | ORAL_TABLET | Freq: Every day | ORAL | Status: DC
  Administered 2021-01-05 – 2021-01-09 (×4): 25 mg via ORAL
  Filled 2021-01-04 (×5): qty 1

## 2021-01-04 MED ORDER — NITROGLYCERIN 0.4 MG SL SUBL: 0.4000 mg | SUBLINGUAL_TABLET | SUBLINGUAL | Status: DC | PRN

## 2021-01-04 MED ORDER — GABAPENTIN 300 MG PO CAPS
300.0000 mg | ORAL_CAPSULE | Freq: Every day | ORAL | Status: DC
  Administered 2021-01-05 – 2021-01-09 (×5): 300 mg via ORAL
  Filled 2021-01-04 (×6): qty 1

## 2021-01-04 MED ORDER — DIGOXIN 125 MCG PO TABS
0.1250 mg | ORAL_TABLET | Freq: Every day | ORAL | Status: DC
  Administered 2021-01-05: 0.125 mg via ORAL
  Filled 2021-01-04: qty 1

## 2021-01-04 MED ORDER — ONDANSETRON HCL 4 MG/2ML IJ SOLN: 4.0000 mg | Freq: Four times a day (QID) | INTRAMUSCULAR | Status: DC | PRN

## 2021-01-04 MED ORDER — OXYCODONE-ACETAMINOPHEN 5-325 MG PO TABS
1.0000 | ORAL_TABLET | ORAL | Status: DC | PRN
  Administered 2021-01-05 – 2021-01-09 (×22): 1 via ORAL
  Filled 2021-01-04 (×22): qty 1

## 2021-01-04 NOTE — Telephone Encounter (Signed)
Latitude alert received for 3 failed ATP and 1 ICD shock received. Patient reports she woke up around 7 am and was watching tv on the couch during time of event. Patient reports she was unaware of shock and states she did not pass out. Reports compliance with all medications.   Phone call was ended prior to our conversation. Numerous attempts to contact patient made, no answer, LMOVM. Patient needs to have shock plan reviewed and driving restrictions.

## 2021-01-04 NOTE — ED Notes (Signed)
2 RNs attempted IV access, IV team consult placed

## 2021-01-04 NOTE — ED Triage Notes (Signed)
Pt arrives POV for eval of defibrillator fired. Pt reports that she had a AICD implanted 3 weeks ago for vfib cardiac arrest. Pt reports company called her tonight and told her that her defibrillator fired yesterday AM, pt denies feeling shock or any type of impulse during shock. Pt reports she was experiencing chest pain/SOB last night, reports ongoing SOB.

## 2021-01-04 NOTE — ED Provider Notes (Signed)
[facility] EMERGENCY DEPARTMENT Provider Note   CSN: [id number] Arrival date & time: 01/04/21  1948     History Chief Complaint  Patient presents with  . Defibrillator Fired    [name] [name] is a 60 y.o. female.  Patient with hx cad, afib, aicd, s/p shock 3 weeks ago for vfib arrest, c/o being told her defibrillator fired yesterday. Pt indicates was unaware of shock. No faintness, near syncope or syncope. Patient also indicates last night did have mid chest pain and sob - symptoms moderate, at rest, took ntg sl. No associated nv or diaphoresis.  Lasted several minutes, now resolved. Denies palpitations. States compliant w normal meds. No leg pain or swelling.   The history is provided by the patient and a relative.       Past Medical History:  Diagnosis Date  . Aneurysm of internal carotid artery   . Anticardiolipin antibody positive    Chronic Coumadin  . Brain aneurysm    followed by Dr. Corliss Skains  . Chronic combined systolic (congestive) and diastolic (congestive) heart failure (HCC)   . Cocaine abuse in remission (HCC)   . Coronary atherosclerosis of native coronary artery    Previous invasive cardiac testing was done at a facility in [place], [place].  Occluded diagonal, Diffuse LAD disease, Occluded Cx, and non obstructive RCA.   . Essential hypertension   . Headache   . History of pneumonia   . ICD (implantable cardioverter-defibrillator) in place   . Ischemic cardiomyopathy    LVEF 30-35%  . Mixed hyperlipidemia   . NSVT (nonsustained ventricular tachycardia) (HCC)   . Persistent atrial fibrillation (HCC)   . PVD (peripheral vascular disease) (HCC)   . Raynaud's phenomenon   . Statin intolerance   . Stroke [facility]) 2007   Total occlusion of the right internal carotid    Patient Active Problem List   Diagnosis Date Noted  . CAD in native artery   . Persistent atrial fibrillation (HCC) 12/15/2020  . DM II (diabetes mellitus, type II),  controlled (HCC) 12/15/2020  . Chronic systolic CHF (congestive heart failure) (HCC) 12/15/2020  . Ventricular tachycardia (HCC) 12/15/2020  . Right carotid artery occlusion   . Syncope 12/14/2020  . Acute respiratory failure with hypoxia (HCC) 08/11/2020  . Hypokalemia 08/11/2020  . GERD (gastroesophageal reflux disease) 08/11/2020  . Allergic rhinitis 08/11/2020  . SIRS (systemic inflammatory response syndrome) (HCC) 01/06/2019  . HCAP (healthcare-associated pneumonia) 12/31/2018  . H. influenzae infection 12/24/2018  . Influenza A 12/24/2018  . Chest pain 06/18/2017  . Hyponatremia 06/18/2017  . Tobacco dependence 06/18/2017  . 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 Left-sided weakness   . Chronic combined systolic (congestive) and diastolic (congestive) heart failure (HCC) 07/26/2015  . Encounter for therapeutic drug monitoring 02/02/2014  . Peripheral arterial disease (HCC) 11/09/2013  . Dual implantable cardioverter-defibrillator in situ 06/01/2012  . Long term current use of anticoagulant 03/22/2011  . Essential hypertension, benign 05/29/2010  . CHEST PAIN 05/29/2010  . Hyperlipidemia 09/26/2009  . Peripheral vascular disease (HCC) 06/23/2009  . Cardiomyopathy, ischemic 11/29/2008  . CEREBROVASCULAR DISEASE 11/29/2008  . Anticardiolipin syndrome (HCC) 11/29/2008    Past Surgical History:  Procedure Laterality Date  . ABDOMINAL AORTAGRAM N/A 05/25/2014   Procedure: ABDOMINAL Ronny Flurry;  Surgeon: Iran Ouch, MD;  Location: [facility] CATH LAB;  Service: Cardiovascular;  Laterality: N/A;  . APPENDECTOMY    . CARDIAC DEFIBRILLATOR PLACEMENT  [employer] ICD  . CARDIOVERSION N/A 12/01/2020   Procedure: CARDIOVERSION;  Surgeon: [name], MD;  Location: [facility] ENDOSCOPY;  Service: Cardiovascular;  Laterality: N/A;  . EP IMPLANTABLE DEVICE N/A 03/01/2016   Procedure: ICD Generator Changeout;  Surgeon: [name], MD;  Location: [facility] INVASIVE CV LAB;  Service: Cardiovascular;  Laterality: N/A;  . L1 corpectomy   12/10  . Laryngeal polyp excision    . RIGHT/LEFT HEART CATH AND CORONARY ANGIOGRAPHY N/A 12/18/2020   Procedure: RIGHT/LEFT HEART CATH AND CORONARY ANGIOGRAPHY;  Surgeon: Dolores Patty, MD;  Location: [facility] INVASIVE CV LAB;  Service: Cardiovascular;  Laterality: N/A;  . TEE WITHOUT CARDIOVERSION N/A 12/01/2020   Procedure: TRANSESOPHAGEAL ECHOCARDIOGRAM (TEE);  Surgeon: [name], MD;  Location: [facility] ENDOSCOPY;  Service: Cardiovascular;  Laterality: N/A;     OB History   No obstetric history on file.     Family History  Problem Relation Age of Onset  . Heart disease Father 90  . Ankylosing spondylitis Sister   . Stomach cancer Brother 89  . Heart attack Brother     Social History   Tobacco Use  . Smoking status: Current Every Day Smoker    Packs/day: 1.00    Years: 43.00    Pack years: 43.00    Types: Cigarettes    Start date: 03/03/1978    Last attempt to quit: 10/03/2013    Years since quitting: 7.2  . Smokeless tobacco: Never Used  . Tobacco comment: 4 ciggs per day  Vaping Use  . Vaping Use: Every day  Substance Use Topics  . Alcohol use: No    Alcohol/week: 0.0 standard drinks  . Drug use: No    Comment: Prior history of cocaine    Home Medications Prior to Admission medications   Medication Sig Start Date End Date Taking? Authorizing Provider  amiodarone (PACERONE) 200 MG tablet Take 1 tablet (200 mg total) by mouth daily. 12/25/20   [name], MD  aspirin EC 81 MG tablet Take 81 mg by mouth daily. Swallow whole.    [provider]  carvedilol (COREG) 6.25 MG tablet Take 1 tablet (6.25 mg total) by mouth 2 (two) times daily with a meal. 12/19/20   Robbie Lis M, PA-C  digoxin (LANOXIN) 0.125 MG tablet Take 0.125 mg by mouth daily.    [provider]  DULoxetine (CYMBALTA) 30 MG capsule Take 30 mg by mouth daily.     [provider]  DULoxetine (CYMBALTA) 60 MG capsule Take 60 mg by mouth every evening.    [provider]  empagliflozin (JARDIANCE) 10 MG TABS tablet Take 1 tablet (10 mg total) by mouth daily before breakfast. 01/03/21   [name], MD  ezetimibe (ZETIA) 10 MG tablet Take 1 tablet (10 mg total) by mouth daily. 08/16/20 11/17/21  Sherryll Burger, Pratik D, DO  famotidine (PEPCID) 20 MG tablet Take 1 tablet (20 mg total) by mouth 2 (two) times daily. Patient taking differently: Take 20 mg by mouth daily. 01/06/19 08/25/21  Kari Baars, MD  fluticasone (FLONASE) 50 MCG/ACT nasal spray Place 1 spray into both nostrils daily as needed for allergies.  09/24/19   [provider]  gabapentin (NEURONTIN) 300 MG capsule Take 300 mg by mouth See admin instructions. Take 300 mg  in the AM and 600 mg  in the PM    [provider]  Multiple Vitamins-Minerals (MULTIVITAMINS THER. W/MINERALS) TABS Take 1 tablet by mouth daily.  [provider]  nitroGLYCERIN (NITROSTAT) 0.4 MG SL tablet PLACE 1 TABLET UNDER THE TONGUE EVERY 5 MINUTES FOR 3 DOSES AS NEEDED CHEST PAIN Patient taking differently: Place 0.4 mg under the tongue every 5 (five) minutes as needed for chest pain. 05/04/19   [name], MD  oxyCODONE-acetaminophen (PERCOCET) 10-325 MG tablet Take 1 tablet by mouth every 4 (four) hours as needed for pain.    [provider]  potassium chloride SA (KLOR-CON) 20 MEQ tablet TAKE 1 TABLET BY MOUTH EVERY DAY 08/10/20   [name], MD  promethazine (PHENERGAN) 25 MG tablet Take 25 mg by mouth every 6 (six) hours as needed for nausea. With pain medication    [provider]  sacubitril-valsartan (ENTRESTO) 24-26 MG Take 1 tablet by mouth 2 (two) times daily. 10/28/20   Leone Brand, NP  spironolactone (ALDACTONE) 25 MG tablet Take 1 tablet (25 mg total) by mouth daily. 11/28/20   [name], MD  torsemide (DEMADEX) 20 MG tablet Take 4  tablets (80 mg total) by mouth daily. May take extra tablet as needed 09/21/20 11/17/21  [name], MD  warfarin (COUMADIN) 10 MG tablet TAKE 1 TABLET BY MOUTH EVERY DAY EXCEPT 1/2 TABLET ON MONDAYS AND THURSDAYS Patient taking differently: Take 10 mg by mouth See admin instructions. Take 5 mg Mon. Wed, Friday , and Saturday All the other days take 10 mg in the evening 10/09/20   [name], MD    Allergies    Beef-derived products, Metoclopramide hcl, Penicillins, Pork-derived products, Metoprolol, Reglan [metoclopramide], and Statins  Review of Systems   Review of Systems  Constitutional: Negative for fever.  HENT: Negative for sore throat.   Eyes: Negative for redness.  Respiratory: Positive for shortness of breath.   Cardiovascular: Positive for chest pain. Negative for palpitations and leg swelling.  Gastrointestinal: Negative for abdominal pain, diarrhea, nausea and vomiting.  Genitourinary: Negative for flank pain.  Musculoskeletal: Negative for back pain and neck pain.  Skin: Negative for rash.  Neurological: Negative for syncope and headaches.  Hematological: Does not bruise/bleed easily.  Psychiatric/Behavioral: Negative for confusion.    Physical Exam Updated Vital Signs BP (!) 80/63 (BP Location: Right Arm)   Pulse 74   Temp 98.5 F (36.9 C) (Oral)   Resp 18   Ht 1.664 m (5' 5.5")   Wt 92.5 kg   SpO2 99%   BMI 33.43 kg/m   Physical Exam Vitals and nursing note reviewed.  Constitutional:      Appearance: Normal appearance. She is well-developed.  HENT:     Head: Atraumatic.     Nose: Nose normal.     Mouth/Throat:     Mouth: Mucous membranes are moist.  Eyes:     General: No scleral icterus.    Conjunctiva/sclera: Conjunctivae normal.     Pupils: Pupils are equal, round, and reactive to light.  Neck:     Trachea: No tracheal deviation.  Cardiovascular:     Rate and Rhythm: Normal rate and regular rhythm.     Pulses: Normal pulses.      Heart sounds: Normal heart sounds. No murmur heard. No friction rub. No gallop.   Pulmonary:     Effort: Pulmonary effort is normal. No respiratory distress.     Breath sounds: Normal breath sounds.  Abdominal:     General: Bowel sounds are normal. There is no distension.     Palpations: Abdomen is soft.  Tenderness: There is no abdominal tenderness. There is no guarding.  Genitourinary:    Comments: No cva tenderness.  Musculoskeletal:        General: No swelling or tenderness.     Cervical back: Normal range of motion and neck supple. No rigidity. No muscular tenderness.  Skin:    General: Skin is warm and dry.     Findings: No rash.  Neurological:     Mental Status: She is alert.     Comments: Alert, speech normal. Motor/sens grossly intact bil.   Psychiatric:        Mood and Affect: Mood normal.      ED Results / Procedures / Treatments   Labs (all labs ordered are listed, but only abnormal results are displayed) Results for orders placed or performed during the hospital encounter of 01/04/21  Basic metabolic panel  Result Value Ref Range   Sodium 132 (L) 135 - 145 mmol/L   Potassium 3.9 3.5 - 5.1 mmol/L   Chloride 95 (L) 98 - 111 mmol/L   CO2 25 22 - 32 mmol/L   Glucose, Bld 92 70 - 99 mg/dL   BUN 15 6 - 20 mg/dL   Creatinine, Ser 8.85 0.44 - 1.00 mg/dL   Calcium 9.0 8.9 - 02.7 mg/dL   GFR, Estimated >74 >12 mL/min   Anion gap 12 5 - 15  CBC  Result Value Ref Range   WBC 10.2 4.0 - 10.5 K/uL   RBC 4.35 3.87 - 5.11 MIL/uL   Hemoglobin 13.3 12.0 - 15.0 g/dL   HCT 87.8 67.6 - 72.0 %   MCV 93.1 80.0 - 100.0 fL   MCH 30.6 26.0 - 34.0 pg   MCHC 32.8 30.0 - 36.0 g/dL   RDW 94.7 09.6 - 28.3 %   Platelets 214 150 - 400 K/uL   nRBC 0.0 0.0 - 0.2 %  Protime-INR  Result Value Ref Range   Prothrombin Time 33.1 (H) 11.4 - 15.2 seconds   INR 3.4 (H) 0.8 - 1.2  Troponin I (High Sensitivity)  Result Value Ref Range   Troponin I (High Sensitivity) 19 (H) <18 ng/L    CARDIAC CATHETERIZATION  Result Date: 12/18/2020  1st Diag lesion is 100% stenosed.  Mid LAD lesion is 45% stenosed.  Prox Cx to Mid Cx lesion is 95% stenosed.  Prox RCA lesion is 30% stenosed.  Findings: Ao = 125/65 (88) LV = 127/13 RA =  1 RV = 27/4 PA = 24/6 (14) PCW = 12 Fick cardiac output/index = 6.2/3.0 PVR = 0.3 WU Ao sat = 99% PA sat = 76%, 77% High SVC sat = 80% Assessment: 1. 2v CAD with 95% mid LCX and chronically occluded D1 2. EF 25% 3. Well compensated hemodynamics with high cardiac output. 4. No evidence of intracardiac shunting Plan/Discussion: Cath results very similar to cath report from 2008 though LCx is 95% stenosed (not 100% as previously reported). I reviewed cath with Dr. Excell Seltzer. Lcx is amenable to PCI but with dropping platelets and absolute need for warfarin and ASA with anti-phospholipid syndrome and recent DC-CV will defer PCI for now particularly in light of normal hstrop. Can reconsider in January or February when farther out from [facility] if platelets rebounded. [name], MD 10:35 AM   US Carotid Bilateral  Result Date: 12/15/2020 CLINICAL DATA:  Right carotid artery occlusion. EXAM: BILATERAL CAROTID DUPLEX ULTRASOUND TECHNIQUE: Wallace Cullens scale imaging, color Doppler and duplex ultrasound were performed of bilateral carotid and vertebral arteries in  the neck. COMPARISON:  10/07/2014 and CT a 07/28/2017 FINDINGS: Criteria: Quantification of carotid stenosis is based on velocity parameters that correlate the residual internal carotid diameter with NASCET-based stenosis levels, using the diameter of the distal internal carotid lumen as the denominator for stenosis measurement. The following velocity measurements were obtained: RIGHT ICA: Occluded CCA: 67/7 cm/sec ECA: 290 cm/sec LEFT ICA: 128/25 cm/sec CCA: 54/09 cm/sec SYSTOLIC ICA/CCA RATIO:  0.4 ECA: 198 cm/sec RIGHT CAROTID ARTERY: Echogenic plaque at the right carotid bulb. External carotid artery is patent with  normal waveform. Chronic occlusion of the proximal internal carotid artery. RIGHT VERTEBRAL ARTERY: Antegrade flow and normal waveform in the right vertebral artery. LEFT CAROTID ARTERY: Echogenic plaque at the left carotid bulb. External carotid artery is patent with normal waveform. Small amount of plaque in the proximal internal carotid artery. Normal waveforms in the internal carotid artery. LEFT VERTEBRAL ARTERY: Antegrade flow and normal waveform in the left vertebral artery. IMPRESSION: 1. 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 2. Small amount of plaque in the left internal carotid artery. Peak systolic velocity in the left internal artery has slightly elevated, now measuring up to 128 cm/sec. Estimated degree of stenosis in the left internal carotid artery is less than 50% based on the carotid artery ratio but recommend continued surveillance. 3. Patent vertebral arteries with antegrade flow. 4. Bilateral external carotid arteries are patent. Again noted is an elevated peak systolic velocity in the right external carotid artery. Electronically Signed   By: Markus Daft M.D.   On: 12/15/2020 12:50   DG Chest Port 1 View  Result Date: 01/04/2021 CLINICAL DATA:  Pain EXAM: PORTABLE CHEST 1 VIEW COMPARISON:  12/14/2020 FINDINGS: There is a dual chamber left-sided pacemaker/ICD in place. There is stable scarring in the mid lung zones bilaterally. There is no pneumothorax. There is persistent cardiomegaly. No large pleural effusion. No acute osseous abnormality. IMPRESSION: No active disease. Electronically Signed   By: Constance Holster M.D.   On: 01/04/2021 21:13   DG Chest Portable 1 View  Result Date: 12/14/2020 CLINICAL DATA:  Syncope syncope EXAM: PORTABLE CHEST 1 VIEW COMPARISON:  08/11/2020 FINDINGS: Left AICD remains in place, unchanged. Cardiomegaly. Linear areas of scarring in the mid lungs bilaterally. No confluent opacities, effusions or edema. IMPRESSION: Cardiomegaly.   Chronic changes.  No active disease. Electronically Signed   By: Rolm Baptise M.D.   On: 12/14/2020 23:32    EKG EKG Interpretation  Date/Time:  Thursday January 04 2021 81:19:14 EST Ventricular Rate:  67 PR Interval:  192 QRS Duration: 118 QT Interval:  416 QTC Calculation: 439 R Axis:   35 Text Interpretation: Normal sinus rhythm Non-specific ST-t changes `similar appearrance to prior ecg Confirmed by Lajean Saver [phone]) on 01/04/2021 8:39:03 PM   Radiology DG Chest Port 1 View  Result Date: 01/04/2021 CLINICAL DATA:  Pain EXAM: PORTABLE CHEST 1 VIEW COMPARISON:  12/14/2020 FINDINGS: There is a dual chamber left-sided pacemaker/ICD in place. There is stable scarring in the mid lung zones bilaterally. There is no pneumothorax. There is persistent cardiomegaly. No large pleural effusion. No acute osseous abnormality. IMPRESSION: No active disease. Electronically Signed   By: Constance Holster M.D.   On: 01/04/2021 21:13    Procedures Procedures (including critical care time)  Medications Ordered in ED Medications - No data to display  ED Course  I have reviewed the triage vital signs and the nursing notes.  Pertinent labs & imaging results that were available during my  care of the patient were reviewed by me and considered in my medical decision making (see chart for details).    MDM Rules/Calculators/A&P                          Iv ns. Continuous pulse ox and cardiac monitoring. Stat labs. Cxr. Ecg.   Reviewed nursing notes and prior charts for additional history.   Labs reviewed/interpreted by me - k normal. Initial trop slightly elv. No current cp.   CXR reviewed/interpreted by me - no pna.   Nursing to interrogate pacer/defib.   MDM Number of Diagnoses or Management Options   Amount and/or Complexity of Data Reviewed Clinical lab tests: ordered and reviewed Tests in the radiology section of CPT: ordered and reviewed Tests in the medicine section of CPT:  ordered and reviewed Discussion of test results with the performing providers: yes Decide to obtain previous medical records or to obtain history from someone other than the patient: yes Obtain history from someone other than the patient: yes Review and summarize past medical records: yes Discuss the patient with other providers: yes Independent visualization of images, tracings, or specimens: yes  Risk of Complications, Morbidity, and/or Mortality Presenting problems: high Diagnostic procedures: high Management options: high   Cardiology consulted - discussed w cardiologist on call - will see/admit.       Final Clinical Impression(s) / ED Diagnoses Final diagnoses:  None    Rx / DC Orders ED Discharge Orders    None       Cathren Laine, MD 01/04/21 [id number]

## 2021-01-04 NOTE — Telephone Encounter (Signed)
Patient had ICD shock 9 am 1/5. She was watching IV but did not felt it. In evening, She had chest pressure which resolved after SL nitro x 2. Patient is asymptomatic.   Reviewed recent cath and admission records with on call Dr. Bjorn Pippin who recommended to come in for further evaluation.   Discussed with the patient who will come to [facility] in about 3-45 minutes. She was appreciative of call.

## 2021-01-04 NOTE — H&P (Signed)
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 Corliss Skains, PAD(prior angio 2015 -> med rx), tobacco abuse, HLD (statin intolerant), VT (prior remote shocks for this).   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 Corliss Skains, PAD(prior angio 2015 -> med rx), tobacco abuse, HLD (statin intolerant), VT (prior remote shocks for this).  Shehas been followed by Dr. Diona Browner but recently established care with Dr. Jearld Pies in [facility]. She also follows with Dr. Ladona Ridgel for EP. [place] [place] [facility] [place]. She had recently gone intoAFwith worsening CHF symptoms. He arrangedfor TEE-DCCV, increased spironolactone, added Farxiga, and felt he'd eventually like to pursue repeat R/LHC to look for revascularizable CAD and assess filling pressures and cardiac output. She underwent TEE/DCCV on 12/3 with mildly dilated LV, EF 25-30%, normal  RV size with mildly decreased systolic function, moderate LAE, no LAA thrombus, mild RAE, moderate MR, mild TR ->DCCV performed with conversion to NSR.  On 12/16 she had abrupt syncope while talking to a friend. No preceding CP. She came to quickly and was [facility]. Int he EDBP 71/43 with repeated f/u value 105/58.K 3.7 Mg 2.3.Device interrogationwithrapid VT 261bpmcorresponding to time of syncopefollowed by ATP then shock x1 with successful conversion to NSR per d/w [employer] rep. Interrogation report also reports includes prior shock in 2017, 2018 and 02/2019. Rep also reported she had 231,000 PVCs since 12/3 (no % provided).  She was transferred to [facility] for further management. Hstrop 13 ->18 ->15 ->12 ->13. Felt ok but reported occasional chest pressure. Switched to heparin for possible cath given CP and VT. No further VT on IV amio.   R/LHC done 12/20.Cath results very similar to cath report from 2008 though LCx is 95% stenosed (not 100% as previously reported). Dr. Gala Romney reviewed cath with Dr. Excell Seltzer. Lcx is amenable to PCI but with dropping platelets (down to 70k) and absolute need for warfarin and ASA with anti-phospholipid syndrome and recent DC-CV will defer PCI for now particularly in light of normal hstrop. Can reconsider in January or February when farther out from [facility] if platelets rebounded.  Post cath, coumadin was resumed w/ heparin bridge. Prior cardiac meds resumed. She was monitored and had no further VT. Ectopy well suppressed w/ amio. She was transitioned off of amio gtt to PO at 400 mg bid. EP advised no driving for 6 months. She was chest pain free day of d/c, vital signs, labs and volume status stable. INR 1.1.  She was seen by Dr. Shirlee Latch on 12-25-20; amiodarone was weaned to 200mg  bid x 7 days, then she was to decrease to 200mg  daily (which  she is currently taking).  Telephone notes indicate that cardiology was notified today that pt had  an ICD fire earlier this AM. Note states "Latitude alert received for 3 failed ATP and 1 ICD shock received. Patient reports she woke up around 7 am and was watching tv on the couch during time of event. Patient reports she was unaware of shock and states she did not pass out. Reports compliance with all medications." Pt was instructed to report to the ED for further evaluation. She had some mild chest discomfort yesterday but resolved w/ SL NTG x 1 dose. HS trop currently 19, plt are 214, and INR 3.4.   Past Medical History:  Diagnosis Date  . Aneurysm of internal carotid artery   . Anticardiolipin antibody positive    Chronic Coumadin  . Brain aneurysm    followed by Dr. Corliss Skains  . Chronic combined systolic (congestive) and diastolic (congestive) heart failure (HCC)   . Cocaine abuse in remission (HCC)   . Coronary atherosclerosis of native coronary artery    Previous invasive cardiac testing was done at a facility in [place], [place].  Occluded diagonal, Diffuse LAD disease, Occluded Cx, and non obstructive RCA.   . Essential hypertension   . Headache   . History of pneumonia   . ICD (implantable cardioverter-defibrillator) in place   . Ischemic cardiomyopathy    LVEF 30-35%  . Mixed hyperlipidemia   . NSVT (nonsustained ventricular tachycardia) (HCC)   . Persistent atrial fibrillation (HCC)   . PVD (peripheral vascular disease) (HCC)   . Raynaud's phenomenon   . Statin intolerance   . Stroke [facility]) 2007   Total occlusion of the right internal carotid    Past Surgical History:  Procedure Laterality Date  . ABDOMINAL AORTAGRAM N/A 05/25/2014   Procedure: ABDOMINAL Ronny Flurry;  Surgeon: Iran Ouch, MD;  Location: [facility] CATH LAB;  Service: Cardiovascular;  Laterality: N/A;  . APPENDECTOMY    . CARDIAC DEFIBRILLATOR PLACEMENT     [employer] ICD  . CARDIOVERSION N/A 12/01/2020   Procedure: CARDIOVERSION;  Surgeon: [name], MD;  Location: [facility] ENDOSCOPY;  Service:  Cardiovascular;  Laterality: N/A;  . EP IMPLANTABLE DEVICE N/A 03/01/2016   Procedure: ICD Generator Changeout;  Surgeon: [name], MD;  Location: [facility] INVASIVE CV LAB;  Service: Cardiovascular;  Laterality: N/A;  . L1 corpectomy   12/10  . Laryngeal polyp excision    . RIGHT/LEFT HEART CATH AND CORONARY ANGIOGRAPHY N/A 12/18/2020   Procedure: RIGHT/LEFT HEART CATH AND CORONARY ANGIOGRAPHY;  Surgeon: Dolores Patty, MD;  Location: [facility] INVASIVE CV LAB;  Service: Cardiovascular;  Laterality: N/A;  . TEE WITHOUT CARDIOVERSION N/A 12/01/2020   Procedure: TRANSESOPHAGEAL ECHOCARDIOGRAM (TEE);  Surgeon: [name], MD;  Location: [facility] ENDOSCOPY;  Service: Cardiovascular;  Laterality: N/A;     Medications Prior to Admission: Prior to Admission medications   Medication Sig Start Date End Date Taking? Authorizing Provider  amiodarone (PACERONE) 200 MG tablet Take 1 tablet (200 mg total) by mouth daily. 12/25/20   [name], MD  aspirin EC 81 MG tablet Take 81 mg by mouth daily. Swallow whole.    [provider]  carvedilol (COREG) 6.25 MG tablet Take 1 tablet (6.25 mg total) by mouth 2 (two) times daily with a meal. 12/19/20   Robbie Lis M, PA-C  digoxin (LANOXIN) 0.125 MG tablet Take 0.125 mg by mouth daily.    [provider]  DULoxetine (CYMBALTA) 30 MG capsule Take 30  mg by mouth daily.    [provider]  DULoxetine (CYMBALTA) 60 MG capsule Take 60 mg by mouth every evening.    [provider]  empagliflozin (JARDIANCE) 10 MG TABS tablet Take 1 tablet (10 mg total) by mouth daily before breakfast. 01/03/21   [name], MD  ezetimibe (ZETIA) 10 MG tablet Take 1 tablet (10 mg total) by mouth daily. 08/16/20 11/17/21  Sherryll Burger, Pratik D, DO  famotidine (PEPCID) 20 MG tablet Take 1 tablet (20 mg total) by mouth 2 (two) times daily. Patient taking differently: Take 20 mg by mouth daily. 01/06/19 08/25/21  Kari Baars, MD  fluticasone  (FLONASE) 50 MCG/ACT nasal spray Place 1 spray into both nostrils daily as needed for allergies.  09/24/19   [provider]  gabapentin (NEURONTIN) 300 MG capsule Take 300 mg by mouth See admin instructions. Take 300 mg  in the AM and 600 mg  in the PM    [provider]  Multiple Vitamins-Minerals (MULTIVITAMINS THER. W/MINERALS) TABS Take 1 tablet by mouth daily.      [provider]  nitroGLYCERIN (NITROSTAT) 0.4 MG SL tablet PLACE 1 TABLET UNDER THE TONGUE EVERY 5 MINUTES FOR 3 DOSES AS NEEDED CHEST PAIN Patient taking differently: Place 0.4 mg under the tongue every 5 (five) minutes as needed for chest pain. 05/04/19   [name], MD  oxyCODONE-acetaminophen (PERCOCET) 10-325 MG tablet Take 1 tablet by mouth every 4 (four) hours as needed for pain.    [provider]  potassium chloride SA (KLOR-CON) 20 MEQ tablet TAKE 1 TABLET BY MOUTH EVERY DAY 08/10/20   [name], MD  promethazine (PHENERGAN) 25 MG tablet Take 25 mg by mouth every 6 (six) hours as needed for nausea. With pain medication    [provider]  sacubitril-valsartan (ENTRESTO) 24-26 MG Take 1 tablet by mouth 2 (two) times daily. 10/28/20   Leone Brand, NP  spironolactone (ALDACTONE) 25 MG tablet Take 1 tablet (25 mg total) by mouth daily. 11/28/20   [name], MD  torsemide (DEMADEX) 20 MG tablet Take 4 tablets (80 mg total) by mouth daily. May take extra tablet as needed 09/21/20 11/17/21  [name], MD  warfarin (COUMADIN) 10 MG tablet TAKE 1 TABLET BY MOUTH EVERY DAY EXCEPT 1/2 TABLET ON MONDAYS AND THURSDAYS Patient taking differently: Take 10 mg by mouth See admin instructions. Take 5 mg Mon. Wed, Friday , and Saturday All the other days take 10 mg in the evening 10/09/20   [name], MD     Allergies:    Allergies  Allergen Reactions  . Beef-Derived Products Anaphylaxis    From tick bite  . Metoclopramide Hcl Anaphylaxis    Non  responsive  . Penicillins Anaphylaxis    Pt states she can take any cephalosporin (per pt. 01/01/19) Shock Has patient had a PCN reaction causing immediate rash, facial/tongue/throat swelling, SOB or lightheadedness with hypotension: Yes Has patient had a PCN reaction causing severe rash involving mucus membranes or skin necrosis: No Has patient had a PCN reaction that required hospitalization Yes Has patient had a PCN reaction occurring within the last 10 years: No If all of the above answers are "NO", then may proceed with Cephalosporin   . Pork-Derived Products Anaphylaxis    From tick bite. Has tolerated heparin  . Metoprolol Other (See Comments)    Syncope   . Reglan [Metoclopramide]   . Statins Other (See Comments)  Myalgias    Social History:   Social History   Socioeconomic History  . Marital status: Single    Spouse name: Not on file  . Number of children: Not on file  . Years of education: Not on file  . Highest education level: Not on file  Occupational History  . Occupation: Disabled    Comment: ESL  Tobacco Use  . Smoking status: Current Every Day Smoker    Packs/day: 1.00    Years: 43.00    Pack years: 43.00    Types: Cigarettes    Start date: 03/03/1978    Last attempt to quit: 10/03/2013    Years since quitting: 7.2  . Smokeless tobacco: Never Used  . Tobacco comment: 4 ciggs per day  Vaping Use  . Vaping Use: Every day  Substance and Sexual Activity  . Alcohol use: No    Alcohol/week: 0.0 standard drinks  . Drug use: No    Comment: Prior history of cocaine  . Sexual activity: Yes    Birth control/protection: None  Other Topics Concern  . Not on file  Social History Narrative  . Not on file   Social Determinants of Health   Financial Resource Strain: Not on file  Food Insecurity: Not on file  Transportation Needs: Not on file  Physical Activity: Not on file  Stress: Not on file  Social Connections: Not on file  Intimate Partner Violence:  Not on file    Family History:   The patient's family history includes Ankylosing spondylitis in her sister; Heart attack in her brother; Heart disease (age of onset: 20) in her father; Stomach cancer (age of onset: 28) in her brother.    ROS:  Please see the history of present illness.  All other ROS reviewed and negative.     Physical Exam/Data:   Vitals:   01/04/21 2002 01/04/21 2146  BP: (!) 80/63 106/69  Pulse: 74 65  Resp: 18 18  Temp: 98.5 F (36.9 C)   TempSrc: Oral   SpO2: 99% 95%  Weight: 92.5 kg   Height: 5' 5.5" (1.664 m)    No intake or output data in the 24 hours ending 01/04/21 2152 Last 3 Weights 01/04/2021 12/25/2020 12/19/2020  Weight (lbs) 204 lb 207 lb 3.2 oz 207 lb  Weight (kg) 92.534 kg 93.985 kg 93.895 kg     Body mass index is 33.43 kg/m.  General:  Well nourished, well developed, in no acute distress HEENT: normal Lymph: no adenopathy Neck: no JVD Endocrine:  No thryomegaly Vascular: No carotid bruits; DP pulses 2+ bilaterally Cardiac:  normal S1, S2; RRR; no murmur  Lungs:  clear to auscultation bilaterally, no wheezing, rhonchi or rales  Abd: soft, nontender, no hepatomegaly  Ext: no edema Musculoskeletal:  No deformities Skin: warm and dry  Neuro:  no focal abnormalities noted Psych:  Normal affect    EKG:  The ECG that was done 01-04-21 was personally reviewed and demonstrates NSR with 1st deg AVB  Relevant CV Studies: [facility] 12-18-20  1st Diag lesion is 100% stenosed.  Mid LAD lesion is 45% stenosed.  Prox Cx to Mid Cx lesion is 95% stenosed.  Prox RCA lesion is 30% stenosed.     Findings:  Ao = 125/65 (88)  LV = 127/13 RA =  1 RV = 27/4 PA = 24/6 (14) PCW = 12 Fick cardiac output/index = 6.2/3.0 PVR = 0.3 WU Ao sat = 99% PA sat = 76%, 77% High SVC sat =  80%    Assessment: 1. 2v CAD with 95% mid LCX and chronically occluded D1 2. EF 25% 3. Well compensated hemodynamics with high cardiac output.  4. No evidence of  intracardiac shunting    Plan/Discussion:  Cath results very similar to cath report from 2008 though LCx is 95% stenosed (not 100% as previously reported). I reviewed cath with Dr. Excell Seltzer. Lcx is amenable to PCI but with dropping platelets and absolute need for warfarin and ASA with anti-phospholipid syndrome and recent DC-CV will defer PCI for now particularly in light of normal hstrop. Can reconsider in January or February when farther out from [facility] if platelets rebounded.   TEE 12-07-20 1. Left ventricular ejection fraction, by estimation, is 25 to 30%. The  left ventricle has severely decreased function. The left ventricle  demonstrates regional wall motion abnormalities with severe hypokinesis to  akinesis of the anterior and septal  walls. The left ventricular internal cavity size was mildly dilated.  2. Right ventricular systolic function is mildly reduced. The right  ventricular size is normal.  3. Left atrial size was moderately dilated. No left atrial/left atrial  appendage thrombus was detected.  4. Right atrial size was mildly dilated.  5. There was moderate mitral regurgitation with ERO 0.26 cm^2 by PISA  (suspect functional MR). No evidence of mitral stenosis.  6. No PFO/ASD by color doppler.  7. The aortic valve is tricuspid. Aortic valve regurgitation is not  visualized. No aortic stenosis is present.  8. Peak RV-RA gradient 18 mmHg.  9. Normal caliber thoracic aorta with no significant plaque.   TTE 08-11-20 1. Limited visualization difficult to discern more focal wall motion  abnormalities. Compared to the Jan 06, 2020 echo there is more prominent  global hypokinesis and at least the anteroseptal wall appears akinetic. Marland Kitchen  Left ventricular ejection fraction, by  estimation, is 15-20%. The left ventricle has severely decreased  function. The left ventricle demonstrates global hypokinesis. Left  ventricular diastolic parameters are indeterminate. Elevated left  atrial  pressure.  2. Right ventricular systolic function is normal. The right ventricular  size is normal.  3. Left atrial size was severely dilated.  4. The mitral valve is normal in structure. Mild mitral valve  regurgitation. No evidence of mitral stenosis.  5. The aortic valve was not well visualized. Aortic valve regurgitation  is not visualized. No aortic stenosis is present.  6. The inferior vena cava is normal in size with greater than 50%  respiratory variability, suggesting right atrial pressure of 3 mmHg.   Laboratory Data:  High Sensitivity Troponin:   Recent Labs  Lab 12/14/20 1921 12/15/20 0112 12/15/20 1057 01/04/21 2015  TROPONINIHS 15 18* 13 19*      Chemistry Recent Labs  Lab 01/04/21 2015  NA 132*  K 3.9  CL 95*  CO2 25  GLUCOSE 92  BUN 15  CREATININE 0.85  CALCIUM 9.0  GFRNONAA >60  ANIONGAP 12    No results for input(s): PROT, ALBUMIN, AST, ALT, ALKPHOS, BILITOT in the last 168 hours. Hematology Recent Labs  Lab 01/04/21 2015  WBC 10.2  RBC 4.35  HGB 13.3  HCT 40.5  MCV 93.1  MCH 30.6  MCHC 32.8  RDW 13.5  PLT 214   BNPNo results for input(s): BNP, PROBNP in the last 168 hours.  DDimer No results for input(s): DDIMER in the last 168 hours.   Radiology/Studies:  DG Chest Port 1 View  Result Date: 01/04/2021 CLINICAL DATA:  Pain EXAM:  PORTABLE CHEST 1 VIEW COMPARISON:  12/14/2020 FINDINGS: There is a dual chamber left-sided pacemaker/ICD in place. There is stable scarring in the mid lung zones bilaterally. There is no pneumothorax. There is persistent cardiomegaly. No large pleural effusion. No acute osseous abnormality. IMPRESSION: No active disease. Electronically Signed   By: Constance Holster M.D.   On: 01/04/2021 21:13     Assessment and Plan:   1. CAD: pt had recent cath as above, showing LCx amenable to PCI. PCI was deferred due to thrombocytopenia, which has now resolved. Pt has had recurrent ICD shock, possibly  scar-mediated vs ischemic. Pt was asymptomatic at the time; will cont amio at 200mg  daily for now. If recurrent VT may need to increase amio dose or switch to alternative agent. 1st HS trop 19. Will cycle trops, admit pt for observation. Consider elective PCI to LCx now that plt have normalized. Will need to hold coumadin and allow INR to drift down; start heparin bridge once INR subtherapeutic given h/o APLA. 2. Cardiomyopathy: EF ~25-30%; currently clinically well-compensated. Cont home HF regimen 3. AF: paroxysmal s/p recent DCCV. On amiodarone currently.  4. APLA: h/o CVA. On chronic ASA and coumadin; INR tonight 3.4. will need heparin bridge once INR drifts down 5. VT: ? Scar-mediated vs ischemic. Cont amiodarone; may need adjustment as above if recurrent event. Consider EP evaluation while in-house             CHA2DS2-VASc Score = 6  This indicates a 9.7% annual risk of stroke. The patient's score is based upon: CHF History: Yes HTN History: Yes Diabetes History: No Stroke History: Yes Vascular Disease History: Yes Age Score: 0 Gender Score: 1     For questions or updates, please contact [place] Please consult [email or web address] for contact info under     Signed, Rudean Curt, MD, [facility] 01/04/2021 9:52 PM

## 2021-01-04 NOTE — Patient Instructions (Signed)
Hold warfarin tonight and tomorrow night then decrease dose to 1/2 tablet daily except 1 tablets on Sundays and Thursdays Has been diagnosed with Alpha Gal.   Keep greens consistent

## 2021-01-04 NOTE — ED Notes (Signed)
[employer] device interrogated.

## 2021-01-05 ENCOUNTER — Encounter (HOSPITAL_COMMUNITY): Payer: Self-pay | Admitting: Cardiology

## 2021-01-05 ENCOUNTER — Other Ambulatory Visit (HOSPITAL_COMMUNITY): Payer: Medicare PPO

## 2021-01-05 ENCOUNTER — Other Ambulatory Visit: Payer: Self-pay

## 2021-01-05 DIAGNOSIS — I493 Ventricular premature depolarization: Secondary | ICD-10-CM

## 2021-01-05 DIAGNOSIS — Z4502 Encounter for adjustment and management of automatic implantable cardiac defibrillator: Secondary | ICD-10-CM | POA: Diagnosis not present

## 2021-01-05 DIAGNOSIS — I472 Ventricular tachycardia: Secondary | ICD-10-CM | POA: Diagnosis not present

## 2021-01-05 LAB — BASIC METABOLIC PANEL
Anion gap: 13 (ref 5–15)
BUN: 14 mg/dL (ref 6–20)
CO2: 27 mmol/L (ref 22–32)
Calcium: 9 mg/dL (ref 8.9–10.3)
Chloride: 96 mmol/L — ABNORMAL LOW (ref 98–111)
Creatinine, Ser: 0.87 mg/dL (ref 0.44–1.00)
GFR, Estimated: >60 mL/min (ref >60)
Glucose, Bld: 67 mg/dL — ABNORMAL LOW (ref 70–99)
Potassium: 4 mmol/L (ref 3.5–5.1)
Sodium: 136 mmol/L (ref 135–145)

## 2021-01-05 LAB — CBC
HCT: 42.1 % (ref 36.0–46.0)
Hemoglobin: 13.3 g/dL (ref 12.0–15.0)
MCH: 30.1 pg (ref 26.0–34.0)
MCHC: 31.6 g/dL (ref 30.0–36.0)
MCV: 95.2 fL (ref 80.0–100.0)
Platelets: 182 10*3/uL (ref 150–400)
RBC: 4.42 MIL/uL (ref 3.87–5.11)
RDW: 13.5 % (ref 11.5–15.5)
WBC: 10.1 10*3/uL (ref 4.0–10.5)
nRBC: 0 % (ref 0.0–0.2)

## 2021-01-05 LAB — RESP PANEL BY RT-PCR (FLU A&B, COVID) ARPGX2
Influenza A by PCR: NEGATIVE
Influenza B by PCR: NEGATIVE
SARS Coronavirus 2 by RT PCR: NEGATIVE

## 2021-01-05 LAB — DIGOXIN LEVEL: Digoxin Level: 0.9 ng/mL (ref 0.8–2.0)

## 2021-01-05 LAB — PROTIME-INR
INR: 3.1 — ABNORMAL HIGH (ref 0.8–1.2)
Prothrombin Time: 31.1 seconds — ABNORMAL HIGH (ref 11.4–15.2)

## 2021-01-05 LAB — TROPONIN I (HIGH SENSITIVITY)
Troponin I (High Sensitivity): 16 ng/L (ref <18)
Troponin I (High Sensitivity): 17 ng/L (ref <18)

## 2021-01-05 MED ORDER — DIGOXIN 125 MCG PO TABS
0.0625 mg | ORAL_TABLET | Freq: Every day | ORAL | Status: DC
  Administered 2021-01-07 – 2021-01-09 (×3): 0.0625 mg via ORAL
  Filled 2021-01-05 (×3): qty 1

## 2021-01-05 MED ORDER — MEXILETINE HCL 150 MG PO CAPS
150.0000 mg | ORAL_CAPSULE | Freq: Three times a day (TID) | ORAL | Status: DC
  Administered 2021-01-05 – 2021-01-09 (×13): 150 mg via ORAL
  Filled 2021-01-05 (×16): qty 1

## 2021-01-05 MED ORDER — AMIODARONE HCL 200 MG PO TABS
400.0000 mg | ORAL_TABLET | Freq: Two times a day (BID) | ORAL | Status: DC
  Administered 2021-01-05 – 2021-01-09 (×9): 400 mg via ORAL
  Filled 2021-01-05 (×9): qty 2

## 2021-01-05 MED ORDER — SODIUM CHLORIDE 0.9% FLUSH
3.0000 mL | Freq: Two times a day (BID) | INTRAVENOUS | Status: DC
  Administered 2021-01-08 (×2): 3 mL via INTRAVENOUS

## 2021-01-05 NOTE — Consult Note (Addendum)
Advanced Heart Failure Team Consult Note   Primary Physician: Glenda Chroman, MD PCP-Cardiologist:  Rozann Lesches, MD  [facility]: Dr. Aundra Dubin  EP: Dr. Lovena Le   Reason for Consultation: VT in the setting of CAD and chronic systolic heart failure   HPI:    [name] [name] is seen today for evaluation of Ventricular Tachycardia, CAD and chronic systolic heart failure at the request of Dr. Lovena Le, Electrophysiology.    60 y.o. female with history of CVA, anti-phospholipid antibody syndrome, CAD, ischemic cardiomyopathy, and persistent atrial fibrillation was initially referred to the [facility] by Dr. Domenic Polite for evaluation of CHF. Patient had a cath in 2008 in setting of an MI in [place].  Diagonal and LCx were totally occluded, there was diffuse LAD disease.  She has a [place].  Echo in 8/21 showed EF 15-20%, anteroseptal akinesis, normal RV, severe LAE, mild MR. She was admitted in 8/21 with CHF exacerbation for 6 days.  She went into atrial fibrillation in 11/21.   In 12/21, she had TEE-guided DCCV back to NSR.  TEE showed EF 25-30%, mildly decreased RV systolic function, moderate MR.    Later in 12/21, she was admitted with syncope and VT with ICD shock.  RHC/LHC showed chronically occluded D1 and 95% proximal LCx; normal filling pressures with CI 3.0. Platelets were low, no intervention was done. Amiodarone was started. Placed on taper to continue 200 mg bid x 7 days, followed by 200 mg daily.   She had post hospital f/u w/ Dr. Aundra Dubin 12/25/20 and denied CP and no further ICD shocks but did endorse mild orthostatic symptoms, limiting med titration. Amiodaorne was reduced down to 200 mg once daily. CBC was ordered to recheck platelets, with intent to consider PCI if improved. Plt ct had improved from 70>>148K.   On 1/6, device clinic received Latitude alert for 3 failed ATP and 1 ICD shock for VT that occurred the morning of 1/5. Pt was awake and watching TV and does not remember any  specific symptoms around the time of ICD fire. She did have an episode of chest pain later that evening that resolved w/ SLNGT x 2. Given VT/ICD shock, she was advised to go to ED.   In ED, K 3.9. Mg not checked. Digoxin level 0.9 Hs troponin 19>>17>>16. COVID negative. WBC 10.2. Hgb 13. Platelets up to 214K on admit (down to 182). INR 3.1.   She has been seen by EP. Suspect this is likely scar mediated VT from significant coronary disease. Amiodarone has been increased back to 400 mg bid and she has been started on Mexiletine 150 mg tid.   She is currently in NSR. No active CP. Denies dyspnea.     2D Echo 07/2020 1. Limited visualization difficult to discern more focal wall motion abnormalities. Compared to the Jan 06, 2020 echo there is more prominent global hypokinesis and at least the anteroseptal wall appears akinetic. Marland Kitchen Left ventricular ejection fraction, by estimation, is 15- 20%. The left ventricle has severely decreased function. The left ventricle demonstrates global hypokinesis. Left ventricular diastolic parameters are indeterminate. Elevated left atrial pressure. 2. Right ventricular systolic function is normal. The right ventricular size is normal. 3. Left atrial size was severely dilated. 4. The mitral valve is normal in structure. Mild mitral valve regurgitation. No evidence of mitral stenosis. 5. The aortic valve was not well visualized. Aortic valve regurgitation is not visualized. No aortic stenosis is present. 6. The inferior vena cava is normal  in size with greater than 50% respiratory variability, suggesting right atrial pressure of 3 mmHg.  Diagnostic LHC 11/2020   1st Diag lesion is 100% stenosed.  Mid LAD lesion is 45% stenosed.  Prox Cx to Mid Cx lesion is 95% stenosed.  Prox RCA lesion is 30% stenosed.   Findings:  Ao = 125/65 (88)  LV = 127/13 RA =  1 RV = 27/4 PA = 24/6 (14) PCW = 12 Fick cardiac output/index = 6.2/3.0 PVR = 0.3 WU Ao sat =  99% PA sat = 76%, 77% High SVC sat = 80%  Assessment: 1. 2v CAD with 95% mid LCX and chronically occluded D1 2. EF 25% 3. Well compensated hemodynamics with high cardiac output.  4. No evidence of intracardiac shunting    Review of Systems: [y] = yes, [ ]  = no   . General: Weight gain [ ] ; Weight loss [ ] ; Anorexia [ ] ; Fatigue [ ] ; Fever [ ] ; Chills [ ] ; Weakness [ ]   . Cardiac: Chest pain/pressure [Y ]; Resting SOB [ ] ; Exertional SOB [ ] ; Orthopnea [ ] ; Pedal Edema [ ] ; Palpitations [ ] ; Syncope [ ] ; Presyncope [ ] ; Paroxysmal nocturnal dyspnea[ ]   . Pulmonary: Cough [ ] ; Wheezing[ ] ; Hemoptysis[ ] ; Sputum [ ] ; Snoring [ ]   . GI: Vomiting[ ] ; Dysphagia[ ] ; Melena[ ] ; Hematochezia [ ] ; Heartburn[ ] ; Abdominal pain [ ] ; Constipation [ ] ; Diarrhea [ ] ; BRBPR [ ]   . GU: Hematuria[ ] ; Dysuria [ ] ; Nocturia[ ]   . Vascular: Pain in legs with walking [ ] ; Pain in feet with lying flat [ ] ; Non-healing sores [ ] ; Stroke [ ] ; TIA [ ] ; Slurred speech [ ] ;  . Neuro: Headaches[ ] ; Vertigo[ ] ; Seizures[ ] ; Paresthesias[ ] ;Blurred vision [ ] ; Diplopia [ ] ; Vision changes [ ]   . Ortho/Skin: Arthritis [ ] ; Joint pain [ ] ; Muscle pain [ ] ; Joint swelling [ ] ; Back Pain [ ] ; Rash [ ]   . Psych: Depression[ ] ; Anxiety[ ]   . Heme: Bleeding problems [ ] ; Clotting disorders [ ] ; Anemia [ ]   . Endocrine: Diabetes [ ] ; Thyroid dysfunction[ ]   Home Medications Prior to Admission medications   Medication Sig Start Date End Date Taking? Authorizing Provider  amiodarone (PACERONE) 200 MG tablet Take 1 tablet (200 mg total) by mouth daily. 12/25/20  Yes , MD  aspirin EC 81 MG tablet Take 81 mg by mouth daily. Swallow whole.   Yes [provider]  carvedilol (COREG) 6.25 MG tablet Take 1 tablet (6.25 mg total) by mouth 2 (two) times daily with a meal. 12/19/20  Yes M, PA-C  dapagliflozin propanediol (FARXIGA) 10 MG TABS tablet Take 10 mg by mouth daily.   Yes [provider]  digoxin (LANOXIN) 0.125 MG tablet Take 0.125 mg by mouth daily.   Yes [provider]  DULoxetine (CYMBALTA) 30 MG capsule Take 30 mg by mouth daily.   Yes [provider]  DULoxetine (CYMBALTA) 60 MG capsule Take 60 mg by mouth every evening.   Yes [provider]  empagliflozin (JARDIANCE) 10 MG TABS tablet Take 1 tablet (10 mg total) by mouth daily before breakfast. 01/03/21  Yes , MD  ezetimibe (ZETIA) 10 MG tablet Take 1 tablet (10 mg total) by mouth daily. 08/16/20 11/17/21 Yes Shah, Pratik D, DO  famotidine (PEPCID) 20 MG tablet Take 1 tablet (20 mg total) by mouth 2 (two) times daily. Patient taking differently:  Take 20 mg by mouth daily. 01/06/19 08/25/21 Yes Kari Baars, MD  fluticasone [facility]) 50 MCG/ACT nasal spray Place 1 spray into both nostrils daily as needed for allergies.  09/24/19  Yes [provider]  gabapentin (NEURONTIN) 300 MG capsule Take 300 mg by mouth See admin instructions. Take 300 mg  in the AM and 600 mg  in the PM   Yes [provider]  LORazepam (ATIVAN) 0.5 MG tablet Take 0.5 mg by mouth 3 (three) times daily as needed for anxiety. 01/01/21  Yes [provider]  Multiple Vitamins-Minerals (MULTIVITAMINS THER. W/MINERALS) TABS Take 1 tablet by mouth daily.     Yes [provider]  nitroGLYCERIN (NITROSTAT) 0.4 MG SL tablet PLACE 1 TABLET UNDER THE TONGUE EVERY 5 MINUTES FOR 3 DOSES AS NEEDED CHEST PAIN Patient taking differently: Place 0.4 mg under the tongue every 5 (five) minutes as needed for chest pain. 05/04/19  Yes [name], MD  oxyCODONE-acetaminophen (PERCOCET) 10-325 MG tablet Take 1 tablet by mouth every 4 (four) hours as needed for pain.   Yes [provider]  potassium chloride SA (KLOR-CON) 20 MEQ tablet TAKE 1 TABLET BY MOUTH EVERY DAY Patient taking differently: Take 20 mEq by mouth daily. 08/10/20  Yes [name], MD  promethazine  (PHENERGAN) 25 MG tablet Take 25 mg by mouth every 6 (six) hours as needed for nausea. With pain medication   Yes [provider]  sacubitril-valsartan (ENTRESTO) 24-26 MG Take 1 tablet by mouth 2 (two) times daily. 10/28/20  Yes Leone Brand, NP  spironolactone (ALDACTONE) 25 MG tablet Take 1 tablet (25 mg total) by mouth daily. 11/28/20  Yes [name], MD  torsemide (DEMADEX) 20 MG tablet Take 4 tablets (80 mg total) by mouth daily. May take extra tablet as needed 09/21/20 11/17/21 Yes [name], MD  warfarin (COUMADIN) 10 MG tablet TAKE 1 TABLET BY MOUTH EVERY DAY EXCEPT 1/2 TABLET ON MONDAYS AND THURSDAYS Patient taking differently: Take 10 mg by mouth See admin instructions. Take 5 mg Mon. Wed, Friday , and Saturday All the other days take 10 mg in the evening 10/09/20  Yes [name], MD    Past Medical History: Past Medical History:  Diagnosis Date  . Aneurysm of internal carotid artery   . Anticardiolipin antibody positive    Chronic Coumadin  . Brain aneurysm    followed by Dr. Corliss Skains  . Chronic combined systolic (congestive) and diastolic (congestive) heart failure (HCC)   . Cocaine abuse in remission (HCC)   . Coronary atherosclerosis of native coronary artery    Previous invasive cardiac testing was done at a facility in [place], [place].  Occluded diagonal, Diffuse LAD disease, Occluded Cx, and non obstructive RCA.   . Essential hypertension   . Headache   . History of pneumonia   . ICD (implantable cardioverter-defibrillator) in place   . Ischemic cardiomyopathy    LVEF 30-35%  . Mixed hyperlipidemia   . NSVT (nonsustained ventricular tachycardia) (HCC)   . Persistent atrial fibrillation (HCC)   . PVD (peripheral vascular disease) (HCC)   . Raynaud's phenomenon   . Statin intolerance   . Stroke [facility]) 2007   Total occlusion of the right internal carotid    Past Surgical History: Past Surgical History:  Procedure Laterality  Date  . ABDOMINAL AORTAGRAM N/A 05/25/2014   Procedure: ABDOMINAL Ronny Flurry;  Surgeon: Iran Ouch, MD;  Location: [facility] CATH LAB;  Service: Cardiovascular;  Laterality: N/A;  . APPENDECTOMY    . CARDIAC DEFIBRILLATOR PLACEMENT     [employer] ICD  . CARDIOVERSION N/A 12/01/2020   Procedure: CARDIOVERSION;  Surgeon: [name], MD;  Location: [facility] ENDOSCOPY;  Service: Cardiovascular;  Laterality: N/A;  . EP IMPLANTABLE DEVICE N/A 03/01/2016   Procedure: ICD Generator Changeout;  Surgeon: [name], MD;  Location: [facility] INVASIVE CV LAB;  Service: Cardiovascular;  Laterality: N/A;  . L1 corpectomy   12/10  . Laryngeal polyp excision    . RIGHT/LEFT HEART CATH AND CORONARY ANGIOGRAPHY N/A 12/18/2020   Procedure: RIGHT/LEFT HEART CATH AND CORONARY ANGIOGRAPHY;  Surgeon: Dolores Patty, MD;  Location: [facility] INVASIVE CV LAB;  Service: Cardiovascular;  Laterality: N/A;  . TEE WITHOUT CARDIOVERSION N/A 12/01/2020   Procedure: TRANSESOPHAGEAL ECHOCARDIOGRAM (TEE);  Surgeon: [name], MD;  Location: [facility] ENDOSCOPY;  Service: Cardiovascular;  Laterality: N/A;    Family History: Family History  Problem Relation Age of Onset  . Heart disease Father 67  . Ankylosing spondylitis Sister   . Stomach cancer Brother 37  . Heart attack Brother     Social History: Social History   Socioeconomic History  . Marital status: Single    Spouse name: Not on file  . Number of children: Not on file  . Years of education: Not on file  . Highest education level: Not on file  Occupational History  . Occupation: Disabled    Comment: ESL  Tobacco Use  . Smoking status: Current Every Day Smoker    Packs/day: 1.00    Years: 43.00    Pack years: 43.00    Types: Cigarettes    Start date: 03/03/1978    Last attempt to quit: 10/03/2013    Years since quitting: 7.2  . Smokeless tobacco: Never Used  . Tobacco comment: 4 ciggs per day  Vaping Use  . Vaping Use: Every day  Substance and Sexual Activity   . Alcohol use: No    Alcohol/week: 0.0 standard drinks  . Drug use: No    Comment: Prior history of cocaine  . Sexual activity: Yes    Birth control/protection: None  Other Topics Concern  . Not on file  Social History Narrative  . Not on file   Social Determinants of Health   Financial Resource Strain: Not on file  Food Insecurity: Not on file  Transportation Needs: Not on file  Physical Activity: Not on file  Stress: Not on file  Social Connections: Not on file    Allergies:  Allergies  Allergen Reactions  . Beef-Derived Products Anaphylaxis    From tick bite  . Metoclopramide Hcl Anaphylaxis    Non responsive  . Penicillins Anaphylaxis    Pt states she can take any cephalosporin (per pt. 01/01/19) Shock Has patient had a PCN reaction causing immediate rash, facial/tongue/throat swelling, SOB or lightheadedness with hypotension: Yes Has patient had a PCN reaction causing severe rash involving mucus membranes or skin necrosis: No Has patient had a PCN reaction that required hospitalization Yes Has patient had a PCN reaction occurring within the last 10 years: No If all of the above answers are "NO", then may proceed with Cephalosporin   . Pork-Derived Products Anaphylaxis    From tick bite. Has tolerated heparin  . Metoprolol Other (See Comments)    Syncope   . Reglan [Metoclopramide]   . Statins Other (See Comments)    Myalgias    Objective:    Vital Signs:   Temp:  [  97 F (36.1 C)-98.5 F (36.9 C)] 97 F (36.1 C) (01/07 0759) Pulse Rate:  [56-79] 72 (01/07 0759) Resp:  [11-18] 15 (01/07 0759) BP: (80-119)/(51-69) 101/54 (01/07 0759) SpO2:  [95 %-99 %] 99 % (01/07 0759) Weight:  [92.5 kg] 92.5 kg (01/06 2002)    Weight change: Filed Weights   01/04/21 2002  Weight: 92.5 kg    Intake/Output:  No intake or output data in the 24 hours ending 01/05/21 0851    Physical Exam    General:  Well appearing. No resp difficulty HEENT: normal Neck:  supple. No JVP . Carotids 2+ bilat; no bruits. No lymphadenopathy or thyromegaly appreciated. Cor: PMI nondisplaced. Regular rate & rhythm. No rubs, gallops or murmurs. Lungs: clear Abdomen: extensive bruising anterior abdomen from recent Lovenox injections, soft, nontender, nondistended. No hepatosplenomegaly. No bruits or masses. Good bowel sounds. Extremities: no cyanosis, clubbing, rash, edema Neuro: alert & orientedx3, cranial nerves grossly intact. moves all 4 extremities w/o difficulty. Affect pleasant   Telemetry   NSR 80s occasional PVCs  EKG    NSR 67 bpm w/ nonspecific ST abnormalities. QT/QTc 416/439 ms   Labs   Basic Metabolic Panel: Recent Labs  Lab 01/04/21 2015 01/05/21 0308  NA 132* 136  K 3.9 4.0  CL 95* 96*  CO2 25 27  GLUCOSE 92 67*  BUN 15 14  CREATININE 0.85 0.87  CALCIUM 9.0 9.0    Liver Function Tests: No results for input(s): AST, ALT, ALKPHOS, BILITOT, PROT, ALBUMIN in the last 168 hours. No results for input(s): LIPASE, AMYLASE in the last 168 hours. No results for input(s): AMMONIA in the last 168 hours.  CBC: Recent Labs  Lab 01/04/21 2015 01/05/21 0308  WBC 10.2 10.1  HGB 13.3 13.3  HCT 40.5 42.1  MCV 93.1 95.2  PLT 214 182    Cardiac Enzymes: No results for input(s): CKTOTAL, CKMB, CKMBINDEX, TROPONINI in the last 168 hours.  BNP: BNP (last 3 results) Recent Labs    08/11/20 0230 12/14/20 1921  BNP 571.0* 209.0*    ProBNP (last 3 results) No results for input(s): PROBNP in the last 8760 hours.   CBG: No results for input(s): GLUCAP in the last 168 hours.  Coagulation Studies: Recent Labs    01/04/21 1055 01/04/21 2042 01/05/21 0308  LABPROT  --  33.1* 31.1*  INR 4.7* 3.4* 3.1*     Imaging   DG Chest Port 1 View  Result Date: 01/04/2021 CLINICAL DATA:  Pain EXAM: PORTABLE CHEST 1 VIEW COMPARISON:  12/14/2020 FINDINGS: There is a dual chamber left-sided pacemaker/ICD in place. There is stable scarring in  the mid lung zones bilaterally. There is no pneumothorax. There is persistent cardiomegaly. No large pleural effusion. No acute osseous abnormality. IMPRESSION: No active disease. Electronically Signed   By: Katherine Mantlehristopher  Green M.D.   On: 01/04/2021 21:13      Medications:     Current Medications: . amiodarone  400 mg Oral BID  . aspirin EC  81 mg Oral Daily  . carvedilol  6.25 mg Oral BID WC  . dapagliflozin propanediol  10 mg Oral Daily  . digoxin  0.125 mg Oral Daily  . DULoxetine  30 mg Oral Daily  . DULoxetine  60 mg Oral QPM  . ezetimibe  10 mg Oral Daily  . famotidine  20 mg Oral Daily  . gabapentin  300 mg Oral Daily  . gabapentin  600 mg Oral QHS  . mexiletine  150 mg Oral Q8H  .  potassium chloride SA  20 mEq Oral Daily  . sacubitril-valsartan  1 tablet Oral BID  . spironolactone  25 mg Oral Daily  . torsemide  80 mg Oral Daily     Infusions:    Assessment/Plan   1. VT w/ ICD Shock  - Had VT 12/21 w/ ICD discharge and syncope, suspect scar mediated VT - now back w/ recurrent VT + ICD discharge - K 4.0. Check Mg level - Hs Trop low level and flat, not suggestive of ACS but she does have known significant coronary disease - Per EP, increase amiodarone back to 400 mg bid - Add mexiletine 250 mg bid  - Consider PCI LCx PCI now that platelets have improved  - no driving x 6 months  2. CAD: MI in 2008 in [facility], cath at the time showed totally occluded diagonal, totally occluded LCX, and diffuse LAD disease.  No intervention. LHC was done in 12/21 due to VT, this showed chronically occluded D1 and 95% proximal LCx.  No intervention as she did not have chest pain and platelets were low.  - She had recent nitrate responsive SSCP + recurrent VT - Hs trop  19>>17>>16 - platelets improved to 182 (down to 70 previous admit). INR suprathearpeutic at 3.1 - consider PCI of LCx on Monday once INR drifts down  - Hold coumadin and start IV heparin once INR < 2.0  - C/w [name], statin  + blocker   3. Chronic systolic CHF: Ischemic cardiomyopathy.  [employer] ICD.  Echo (8/21) with EF 15-20%, anteroseptal AK, RV normal, severe LAE, mild MR.  TEE (12/21) with EF 25-30%.  RHC in 12/21 with preserved cardiac output and optimized filling pressures.  Euvolemic on exam. NYHA Class II - Continue Entresto 24/26 bid. - Continue Farxiga 10 mg daily.   - Continue spironolactone 25 mg daily.  - Continue Coreg 6.25 mg bid.  - Continue torsemide 80 mg daily - Continue digoxin 0.125 daily, dig level ok   4. Atrial Fibrillation: TEE-guided DCCV to NSR in 12/21.  She is in NSR today.   - on amiodarone, increasing to 400 mg bid for VT - on coumadin, hold for potential cath   5. 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  - She is on [name] 81 and warfarin chronically - holding coumadin per above, start heparin gtt once INR < 2.0   6. Thrombocytopenia: Chronic and mild.  Down to 70K during last hospitalization.  - 182 K today   7. Tobacco abuse - smoking cessation advised   8. Mitral regurgitation: Moderate on 12/21 TEE, suspect functional.  Length of Stay: 0  Robbie Lis, PA-C  01/05/2021, 8:51 AM  Advanced Heart Failure Team Pager [phone] (M-F; 7a - 4p)  Please contact [facility] for night-coverage after hours (4p -7a ) and weekends on [email or web address]  Patient seen with PA, agree with the above note .  Patient was shocked for VT by her ICD and directed to the ER.  She does not remember the shock and does not think she passed out.  She had been doing well in general, minimal dyspnea.  She has occasional episodes of atypical chest pain, none at time of shock.  Troponin negative.   Feels fine this morning, walked down hall.   General: NAD Neck: No JVD, no thyromegaly or thyroid nodule.  Lungs: Clear to auscultation bilaterally with normal respiratory effort. CV: Nondisplaced PMI.  Heart regular S1/S2, no S3/S4, no murmur.  No peripheral edema.  No  carotid bruit.  Normal pedal pulses.  Abdomen: Soft, nontender, no hepatosplenomegaly, no distention.  Skin: Intact without lesions or rashes.  Neurologic: Alert and oriented x 3.  Psych: Normal affect. Extremities: No clubbing or cyanosis.  HEENT: Normal.   1. CAD: MI in 2008 in [place], cath at the time showed totally occluded diagonal, totally occluded LCX, and diffuse LAD disease.  No intervention. LHC was done in 12/21 due to VT, this showed chronically occluded D1 and 95% proximal LCx.  No intervention as she did not have chest pain and platelets were low. She is now back with VT, ?scar mediated.  Troponin negative. Platelets now normal.  - Continue [name] 81 daily.  - Continue Zetia, has not tolerated statins.  Good lipids in 12/21.  - Given recurrent VT, would recommend LCx intervention this admission.  INR 3.1 today, will hold warfarin and cover with heparin gtt when INR < 2 (APLAS).  Suspect intervention will need to be Monday.  Will review films with Dr. Herbie Baltimore today.  2. Atrial fibrillation: TEE-guided DCCV to NSR in 12/21.  She is in NSR today.   - If atrial fibrillation recurs, will need to consider ablation.  - She is on amiodarone.  3. Carotid stenosis: Known RICA occlusion. Dopplers in 12/21 with <50% LICA.  - Repeat dopplers in 12/22.  4. 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  - She is on [name] 81 and warfarin => holding warfarin for PCI, cover with heparin gtt when INR < 2.   5. Chronic systolic CHF: Ischemic cardiomyopathy.  [employer] ICD.  Echo (8/21) with EF 15-20%, anteroseptal AK, RV normal, severe LAE, mild MR.  TEE (12/21) with EF 25-30%.  RHC in 12/21 with preserved cardiac output and optimized filling pressures.  NYHA class II symptoms, not volume overloaded on exam. SBP in 100s, this is typical for her.  - Continue Entresto 24/26 bid. - Continue Farxiga 10 mg daily.   - Continue spironolactone 25 mg daily.  - Continue Coreg 6.25 mg bid.  -  Continue torsemide 80 mg daily.  - Continue digoxin 0.125 daily, digoxin level 0.9.   6. Smoking: Active smoker, wants to stop on her own.  7. PAD: She denies claudication.  - Needs to quit smoking.  8. Thrombocytopenia: Chronic and mild.  Down to 70K during last hospitalization. Now up to 182.  Adequate for PCI.  - Needs hematology followup.  9. Mitral regurgitation: Moderate on 12/21 TEE, suspect functional.  10. VT: Suspect scar-mediated in 12/21 with ICD discharge and syncope.  Again with VT 01/03/21 with ICD discharge, did not pass out.  Troponin not elevated.  - No driving x 6 months.  - Reload amiodarone and adding mexiletine per EP.  - As above, plan to intervene on LCx.  11. PVCs: Frequent, multiple morphologies so probably not amenable to ablation.  - Antiarrhythmics as above.   [name] 01/05/2021 10:51 AM

## 2021-01-05 NOTE — Progress Notes (Signed)
ANTICOAGULATION CONSULT NOTE - Initial Consult  Pharmacy Consult for Heparin Indication: atrial fibrillation  Allergies  Allergen Reactions  . Beef-Derived Products Anaphylaxis    From tick bite  . Metoclopramide Hcl Anaphylaxis    Non responsive  . Penicillins Anaphylaxis    Pt states she can take any cephalosporin (per pt. 01/01/19) Shock Has patient had a PCN reaction causing immediate rash, facial/tongue/throat swelling, SOB or lightheadedness with hypotension: Yes Has patient had a PCN reaction causing severe rash involving mucus membranes or skin necrosis: No Has patient had a PCN reaction that required hospitalization Yes Has patient had a PCN reaction occurring within the last 10 years: No If all of the above answers are "NO", then may proceed with Cephalosporin   . Pork-Derived Products Anaphylaxis    From tick bite. Has tolerated heparin  . Metoprolol Other (See Comments)    Syncope   . Reglan [Metoclopramide]   . Statins Other (See Comments)    Myalgias    Patient Measurements: Height: 5' 5.5" (166.4 cm) Weight: 92.5 kg (204 lb) IBW/kg (Calculated) : 58.15   Vital Signs: Temp: 97 F (36.1 C) (01/07 0759) Temp Source: Axillary (01/07 0759) BP: 105/54 (01/07 1107) Pulse Rate: 64 (01/07 1107)  Labs: Recent Labs    01/04/21 1055 01/04/21 2015 01/04/21 2015 01/04/21 2042 01/04/21 2205 01/05/21 0033 01/05/21 0308  HGB  --  13.3  --   --   --   --  13.3  HCT  --  40.5  --   --   --   --  42.1  PLT  --  214  --   --   --   --  182  LABPROT  --   --   --  33.1*  --   --  31.1*  INR 4.7*  --   --  3.4*  --   --  3.1*  CREATININE  --  0.85  --   --   --   --  0.87  TROPONINIHS  --  19*   < >  --  17 16 17    < > = values in this interval not displayed.    Estimated Creatinine Clearance: 81 mL/min (by C-G formula based on SCr of 0.87 mg/dL).   Medical History: Past Medical History:  Diagnosis Date  . Aneurysm of internal carotid artery   .  Anticardiolipin antibody positive    Chronic Coumadin  . Brain aneurysm    followed by Dr.  . Chronic combined systolic (congestive) and diastolic (congestive) heart failure (HCC)   . Cocaine abuse in remission (HCC)   . Coronary atherosclerosis of native coronary artery    Previous invasive cardiac testing was done at a facility in [place], [place].  Occluded diagonal, Diffuse LAD disease, Occluded Cx, and non obstructive RCA.   . Essential hypertension   . Headache   . History of pneumonia   . ICD (implantable cardioverter-defibrillator) in place   . Ischemic cardiomyopathy    LVEF 30-35%  . Mixed hyperlipidemia   . NSVT (nonsustained ventricular tachycardia) (HCC)   . Persistent atrial fibrillation (HCC)   . PVD (peripheral vascular disease) (HCC)   . Raynaud's phenomenon   . Statin intolerance   . Stroke [facility]) 2007   Total occlusion of the right internal carotid    Assessment: 59yof admitted after ICD shock for VT.  Hx HF, Afib s/i DCCV >SR 12/21, and CAD.  Plan to hold warfarin, begin heparin when  INR close 2 (3.1 on admit), cath next week for possible intervention.  CBC stable   Goal of Therapy:  INR 2-3 Heparin level 0.3-0.7 units/ml Monitor platelets by anticoagulation protocol: Yes   Plan:  Hold warfarin  Begin heparin drip when INR close to 2 Daily INR/CBC   Leota Sauers Pharm.D. CPP, BCPS Clinical Pharmacist [phone] 01/05/2021 11:35 AM

## 2021-01-05 NOTE — Consult Note (Signed)
ELECTROPHYSIOLOGY CONSULT NOTE    Patient ID: [name] [name] MRN: [id number], DOB/AGE: 1961/02/21 60 y.o.  Admit date: 01/04/2021 Date of Consult: 01/05/2021  Primary Physician: Glenda Chroman, MD Primary Cardiologist: Rozann Lesches, MD  Electrophysiologist: Dr. Lovena Le  Referring Provider: Dr. Hassell Done  Patient Profile: [name] [name] is a 60 y.o. female with a history of CVA, anti-phospholipid antibody syndrome, CAD, ICM, chronic combined CHF, s/p [employer] ICD, persistent AF, remote cocaine abuse, h/o brain aneurysm followed by Dr. Estanislado Pandy, PAD, tobacco abuse, HLD, and VT who is being seen today for the evaluation of VT with ICD shock at the request of Dr. Hassell Done.  HPI:  [name] [name] is a 60 y.o. female with complicated medical history as above.   She was admitted 11/2020 with ICD shock and started on amiodarone. Underwent cath which showed results very similar to cath report from 2008 though LCx is 95% stenosed (not 100% as previously reported).Dr. Karmen Stabs cath with Dr. Burt Knack. Lcx is amenable to PCI but with dropping platelets (down to 70k)and absolute need for warfarin and ASA with anti-phospholipid syndrome and recent DC-CV will defer PCI for now particularly in light of normal hstrop. Can reconsider in January or February when farther out from [facility] if platelets rebounded.  She was discharged on amiodarone, which was down titrated to 200 mg BID x 7 days on 12/27 with plans to decrease to 200 mg daily (which she has done)  Latitude alert received 01/04/2021 for 3 failed ATP and 1 ICD shock.  She reported she was awake watching TV and doesn't remember any specific symptoms.  She did have chest pressure the evening of 1/5 that resolved with SL NTG.   She was recommended to come in to ED for further evaluation due to recurrent VT and recent cath with potential for PCI.   Pertinent labs on admission include K 3.9, Cr 0.85, NA 132, WBC 10.2, Hgb 13.3, HS trop 19 -> 17  -> 16. COVID negative. Digoxin level 0.9.  This am she is feeling OK. She doesn't remember any specific symptoms surrounding her ICD shock, nor does she remember feeling it. It is possible she had brief, transient LOC without realizing if she did not feel shock. She  denies palpitations, dyspnea, PND, orthopnea, nausea, vomiting, dizziness, syncope, edema, weight gain, or early satiety. She does have intermittent CP (most recently evening of 1/5) that resolves with SL NTG.   Past Medical History:  Diagnosis Date  . Aneurysm of internal carotid artery   . Anticardiolipin antibody positive    Chronic Coumadin  . Brain aneurysm    followed by Dr. Estanislado Pandy  . Chronic combined systolic (congestive) and diastolic (congestive) heart failure ([place])   . Cocaine abuse in remission ([place])   . Coronary atherosclerosis of native coronary artery    Previous invasive cardiac testing was done at a facility in [place], [place].  Occluded diagonal, Diffuse LAD disease, Occluded Cx, and non obstructive RCA.   . Essential hypertension   . Headache   . History of pneumonia   . ICD (implantable cardioverter-defibrillator) in place   . Ischemic cardiomyopathy    LVEF 30-35%  . Mixed hyperlipidemia   . NSVT (nonsustained ventricular tachycardia) ([place])   . Persistent atrial fibrillation ([place])   . PVD (peripheral vascular disease) ([place])   . Raynaud's phenomenon   . Statin intolerance   . Stroke [facility]) 2007   Total occlusion of the right internal carotid     Surgical  History:  Past Surgical History:  Procedure Laterality Date  . ABDOMINAL AORTAGRAM N/A 05/25/2014   Procedure: ABDOMINAL Ronny Flurry;  Surgeon: Iran Ouch, MD;  Location: [facility] CATH LAB;  Service: Cardiovascular;  Laterality: N/A;  . APPENDECTOMY    . CARDIAC DEFIBRILLATOR PLACEMENT     [employer] ICD  . CARDIOVERSION N/A 12/01/2020   Procedure: CARDIOVERSION;  Surgeon: [name], MD;  Location: [facility] ENDOSCOPY;  Service: Cardiovascular;   Laterality: N/A;  . EP IMPLANTABLE DEVICE N/A 03/01/2016   Procedure: ICD Generator Changeout;  Surgeon: [name], MD;  Location: [facility] INVASIVE CV LAB;  Service: Cardiovascular;  Laterality: N/A;  . L1 corpectomy   12/10  . Laryngeal polyp excision    . RIGHT/LEFT HEART CATH AND CORONARY ANGIOGRAPHY N/A 12/18/2020   Procedure: RIGHT/LEFT HEART CATH AND CORONARY ANGIOGRAPHY;  Surgeon: Dolores Patty, MD;  Location: [facility] INVASIVE CV LAB;  Service: Cardiovascular;  Laterality: N/A;  . TEE WITHOUT CARDIOVERSION N/A 12/01/2020   Procedure: TRANSESOPHAGEAL ECHOCARDIOGRAM (TEE);  Surgeon: [name], MD;  Location: [facility] ENDOSCOPY;  Service: Cardiovascular;  Laterality: N/A;     (Not in a hospital admission)   Inpatient Medications:  . amiodarone  400 mg Oral BID  . aspirin EC  81 mg Oral Daily  . carvedilol  6.25 mg Oral BID WC  . dapagliflozin propanediol  10 mg Oral Daily  . digoxin  0.125 mg Oral Daily  . DULoxetine  30 mg Oral Daily  . DULoxetine  60 mg Oral QPM  . ezetimibe  10 mg Oral Daily  . famotidine  20 mg Oral Daily  . gabapentin  300 mg Oral Daily  . gabapentin  600 mg Oral QHS  . mexiletine  150 mg Oral Q8H  . potassium chloride SA  20 mEq Oral Daily  . sacubitril-valsartan  1 tablet Oral BID  . spironolactone  25 mg Oral Daily  . torsemide  80 mg Oral Daily    Allergies:  Allergies  Allergen Reactions  . Beef-Derived Products Anaphylaxis    From tick bite  . Metoclopramide Hcl Anaphylaxis    Non responsive  . Penicillins Anaphylaxis    Pt states she can take any cephalosporin (per pt. 01/01/19) Shock Has patient had a PCN reaction causing immediate rash, facial/tongue/throat swelling, SOB or lightheadedness with hypotension: Yes Has patient had a PCN reaction causing severe rash involving mucus membranes or skin necrosis: No Has patient had a PCN reaction that required hospitalization Yes Has patient had a PCN reaction occurring within the last 10 years:  No If all of the above answers are "NO", then may proceed with Cephalosporin   . Pork-Derived Products Anaphylaxis    From tick bite. Has tolerated heparin  . Metoprolol Other (See Comments)    Syncope   . Reglan [Metoclopramide]   . Statins Other (See Comments)    Myalgias    Social History   Socioeconomic History  . Marital status: Single    Spouse name: Not on file  . Number of children: Not on file  . Years of education: Not on file  . Highest education level: Not on file  Occupational History  . Occupation: Disabled    Comment: ESL  Tobacco Use  . Smoking status: Current Every Day Smoker    Packs/day: 1.00    Years: 43.00    Pack years: 43.00    Types: Cigarettes    Start date: 03/03/1978    Last attempt to quit: 10/03/2013  Years since quitting: 7.2  . Smokeless tobacco: Never Used  . Tobacco comment: 4 ciggs per day  Vaping Use  . Vaping Use: Every day  Substance and Sexual Activity  . Alcohol use: No    Alcohol/week: 0.0 standard drinks  . Drug use: No    Comment: Prior history of cocaine  . Sexual activity: Yes    Birth control/protection: None  Other Topics Concern  . Not on file  Social History Narrative  . Not on file   Social Determinants of Health   Financial Resource Strain: Not on file  Food Insecurity: Not on file  Transportation Needs: Not on file  Physical Activity: Not on file  Stress: Not on file  Social Connections: Not on file  Intimate Partner Violence: Not on file     Family History  Problem Relation Age of Onset  . Heart disease Father 42  . Ankylosing spondylitis Sister   . Stomach cancer Brother 62  . Heart attack Brother      Review of Systems: All other systems reviewed and are otherwise negative except as noted above.  Physical Exam: Vitals:   01/05/21 0511 01/05/21 0600 01/05/21 0700 01/05/21 0715  BP: 98/68 118/69 (!) 101/54   Pulse: 60 79 (!) 57   Resp: 14 14 11    Temp:      TempSrc:      SpO2: 97% 96%  98%   Weight:      Height:        GEN- The patient is well appearing, alert and oriented x 3 today.   HEENT: normocephalic, atraumatic; sclera clear, conjunctiva pink; hearing intact; oropharynx clear; neck supple Lungs- Clear to ausculation bilaterally, normal work of breathing.  No wheezes, rales, rhonchi Heart- Regular rate and rhythm, no murmurs, rubs or gallops GI- soft, non-tender, non-distended, bowel sounds present Extremities- no clubbing, cyanosis, or edema; DP/PT/radial pulses 2+ bilaterally MS- no significant deformity or atrophy Skin- warm and dry, no rash or lesion Psych- euthymic mood, full affect Neuro- strength and sensation are intact  Labs:   Lab Results  Component Value Date   WBC 10.1 01/05/2021   HGB 13.3 01/05/2021   HCT 42.1 01/05/2021   MCV 95.2 01/05/2021   PLT 182 01/05/2021    Recent Labs  Lab 01/05/21 0308  NA 136  K 4.0  CL 96*  CO2 27  BUN 14  CREATININE 0.87  CALCIUM 9.0  GLUCOSE 67*      Radiology/Studies: CARDIAC CATHETERIZATION  Result Date: 12/18/2020  1st Diag lesion is 100% stenosed.  Mid LAD lesion is 45% stenosed.  Prox Cx to Mid Cx lesion is 95% stenosed.  Prox RCA lesion is 30% stenosed.  Findings: Ao = 125/65 (88) LV = 127/13 RA =  1 RV = 27/4 PA = 24/6 (14) PCW = 12 Fick cardiac output/index = 6.2/3.0 PVR = 0.3 WU Ao sat = 99% PA sat = 76%, 77% High SVC sat = 80% Assessment: 1. 2v CAD with 95% mid LCX and chronically occluded D1 2. EF 25% 3. Well compensated hemodynamics with high cardiac output. 4. No evidence of intracardiac shunting Plan/Discussion: Cath results very similar to cath report from 2008 though LCx is 95% stenosed (not 100% as previously reported). I reviewed cath with Dr. 2009. Lcx is amenable to PCI but with dropping platelets and absolute need for warfarin and ASA with anti-phospholipid syndrome and recent DC-CV will defer PCI for now particularly in light of normal hstrop. Can reconsider in  January or February  when farther out from [facility] if platelets rebounded. [name], MD 10:35 AM   US Carotid Bilateral  Result Date: 12/15/2020 CLINICAL DATA:  Right carotid artery occlusion. EXAM: BILATERAL CAROTID DUPLEX ULTRASOUND TECHNIQUE: Wallace Cullens scale imaging, color Doppler and duplex ultrasound were performed of bilateral carotid and vertebral arteries in the neck. COMPARISON:  10/07/2014 and CT a 07/28/2017 FINDINGS: Criteria: Quantification of carotid stenosis is based on velocity parameters that correlate the residual internal carotid diameter with NASCET-based stenosis levels, using the diameter of the distal internal carotid lumen as the denominator for stenosis measurement. The following velocity measurements were obtained: RIGHT ICA: Occluded CCA: 67/7 cm/sec ECA: 290 cm/sec LEFT ICA: 128/25 cm/sec CCA: 93/19 cm/sec SYSTOLIC ICA/CCA RATIO:  0.4 ECA: 198 cm/sec RIGHT CAROTID ARTERY: Echogenic plaque at the right carotid bulb. External carotid artery is patent with normal waveform. Chronic occlusion of the proximal internal carotid artery. RIGHT VERTEBRAL ARTERY: Antegrade flow and normal waveform in the right vertebral artery. LEFT CAROTID ARTERY: Echogenic plaque at the left carotid bulb. External carotid artery is patent with normal waveform. Small amount of plaque in the proximal internal carotid artery. Normal waveforms in the internal carotid artery. LEFT VERTEBRAL ARTERY: Antegrade flow and normal waveform in the left vertebral artery. IMPRESSION: 1. 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 2. Small amount of plaque in the left internal carotid artery. Peak systolic velocity in the left internal artery has slightly elevated, now measuring up to 128 cm/sec. Estimated degree of stenosis in the left internal carotid artery is less than 50% based on the carotid artery ratio but recommend continued surveillance. 3. Patent vertebral arteries with antegrade flow. 4. Bilateral external carotid  arteries are patent. Again noted is an elevated peak systolic velocity in the right external carotid artery. Electronically Signed   By: Richarda Overlie M.D.   On: 12/15/2020 12:50   DG Chest Port 1 View  Result Date: 01/04/2021 CLINICAL DATA:  Pain EXAM: PORTABLE CHEST 1 VIEW COMPARISON:  12/14/2020 FINDINGS: There is a dual chamber left-sided pacemaker/ICD in place. There is stable scarring in the mid lung zones bilaterally. There is no pneumothorax. There is persistent cardiomegaly. No large pleural effusion. No acute osseous abnormality. IMPRESSION: No active disease. Electronically Signed   By: Katherine Mantle M.D.   On: 01/04/2021 21:13   DG Chest Portable 1 View  Result Date: 12/14/2020 CLINICAL DATA:  Syncope syncope EXAM: PORTABLE CHEST 1 VIEW COMPARISON:  08/11/2020 FINDINGS: Left AICD remains in place, unchanged. Cardiomegaly. Linear areas of scarring in the mid lungs bilaterally. No confluent opacities, effusions or edema. IMPRESSION: Cardiomegaly.  Chronic changes.  No active disease. Electronically Signed   By: Charlett Nose M.D.   On: 12/14/2020 23:32    EKG: NSR at 66 bpm (personally reviewed)  TELEMETRY:  NSR 60s, no further VT (personally reviewed)  DEVICE HISTORY: [employer] ICD implanted 2008, gen change 2017  Assessment/Plan: 1. VT treated with successful ICD shock Likely scar mediated VT from significant coronary artery disease.  Does not sound like a primary ischemic event led to the VT (flat troponin trend), but ? Ischemic component with significant CAD and intermittent CP.  She has been stable x 48 hours, so will reload on po amiodarone 400 mg BID and also start mexiletine 150 mg q 8 hrs.  Keep K > 4.0 and Mg > 2.0   2. PVCs Likely secondary to severe underlying cardiomyopathy.  At least 3-4 morphologies on ECGs making ablation  therapy not an ideal management strategy.  Amio as above.   3. AF S/p recent TEE/DCCV 12/01/2020 which may complicated consideration for  cath re: [facility] holding.  INR 3.1 this am on coumadin.   4. CAD Recent cath which showed results very similar to cath report from 2008 though LCx is 95% stenosed (not 100% as previously reported).Dr. Mauro Kaufmann cath with Dr. Excell Seltzer. Lcx is amenable to PCI but with dropping platelets (down to 70k)and absolute need for warfarin and ASA with anti-phospholipid syndrome and recent DC-CV will defer PCI for now particularly in light of normal hstrop. Can reconsider in January or February when farther out from [facility] if platelets rebounded. Will defer to HF team for timing.  Platelets 182 this am.   For questions or updates, please contact [facility] Please consult [email or web address] for contact info under Cardiology/STEMI.  Dustin Flock, PA-C  01/05/2021 7:48 AM

## 2021-01-06 DIAGNOSIS — Z7901 Long term (current) use of anticoagulants: Secondary | ICD-10-CM | POA: Diagnosis not present

## 2021-01-06 DIAGNOSIS — Z800 Family history of malignant neoplasm of digestive organs: Secondary | ICD-10-CM | POA: Diagnosis not present

## 2021-01-06 DIAGNOSIS — I671 Cerebral aneurysm, nonruptured: Secondary | ICD-10-CM | POA: Diagnosis present

## 2021-01-06 DIAGNOSIS — Z20822 Contact with and (suspected) exposure to covid-19: Secondary | ICD-10-CM | POA: Diagnosis present

## 2021-01-06 DIAGNOSIS — Z79899 Other long term (current) drug therapy: Secondary | ICD-10-CM | POA: Diagnosis not present

## 2021-01-06 DIAGNOSIS — I7300 Raynaud's syndrome without gangrene: Secondary | ICD-10-CM | POA: Diagnosis present

## 2021-01-06 DIAGNOSIS — Z888 Allergy status to other drugs, medicaments and biological substances status: Secondary | ICD-10-CM | POA: Diagnosis not present

## 2021-01-06 DIAGNOSIS — D693 Immune thrombocytopenic purpura: Secondary | ICD-10-CM | POA: Diagnosis present

## 2021-01-06 DIAGNOSIS — E782 Mixed hyperlipidemia: Secondary | ICD-10-CM | POA: Diagnosis present

## 2021-01-06 DIAGNOSIS — I110 Hypertensive heart disease with heart failure: Secondary | ICD-10-CM | POA: Diagnosis present

## 2021-01-06 DIAGNOSIS — I5042 Chronic combined systolic (congestive) and diastolic (congestive) heart failure: Secondary | ICD-10-CM | POA: Diagnosis present

## 2021-01-06 DIAGNOSIS — Z8249 Family history of ischemic heart disease and other diseases of the circulatory system: Secondary | ICD-10-CM | POA: Diagnosis not present

## 2021-01-06 DIAGNOSIS — I4819 Other persistent atrial fibrillation: Secondary | ICD-10-CM | POA: Diagnosis present

## 2021-01-06 DIAGNOSIS — D6861 Antiphospholipid syndrome: Secondary | ICD-10-CM | POA: Diagnosis present

## 2021-01-06 DIAGNOSIS — Z7982 Long term (current) use of aspirin: Secondary | ICD-10-CM | POA: Diagnosis not present

## 2021-01-06 DIAGNOSIS — I340 Nonrheumatic mitral (valve) insufficiency: Secondary | ICD-10-CM | POA: Diagnosis present

## 2021-01-06 DIAGNOSIS — I2510 Atherosclerotic heart disease of native coronary artery without angina pectoris: Secondary | ICD-10-CM | POA: Diagnosis present

## 2021-01-06 DIAGNOSIS — Z8673 Personal history of transient ischemic attack (TIA), and cerebral infarction without residual deficits: Secondary | ICD-10-CM | POA: Diagnosis not present

## 2021-01-06 DIAGNOSIS — Z91014 Allergy to mammalian meats: Secondary | ICD-10-CM | POA: Diagnosis not present

## 2021-01-06 DIAGNOSIS — D696 Thrombocytopenia, unspecified: Secondary | ICD-10-CM | POA: Diagnosis not present

## 2021-01-06 DIAGNOSIS — I255 Ischemic cardiomyopathy: Secondary | ICD-10-CM | POA: Diagnosis present

## 2021-01-06 DIAGNOSIS — Z95810 Presence of automatic (implantable) cardiac defibrillator: Secondary | ICD-10-CM | POA: Diagnosis not present

## 2021-01-06 DIAGNOSIS — I472 Ventricular tachycardia: Secondary | ICD-10-CM | POA: Diagnosis present

## 2021-01-06 DIAGNOSIS — R072 Precordial pain: Secondary | ICD-10-CM | POA: Diagnosis present

## 2021-01-06 DIAGNOSIS — Z880 Allergy status to penicillin: Secondary | ICD-10-CM | POA: Diagnosis not present

## 2021-01-06 DIAGNOSIS — I252 Old myocardial infarction: Secondary | ICD-10-CM | POA: Diagnosis not present

## 2021-01-06 LAB — PROTIME-INR
INR: 2 — ABNORMAL HIGH (ref 0.8–1.2)
Prothrombin Time: 22.1 seconds — ABNORMAL HIGH (ref 11.4–15.2)

## 2021-01-06 LAB — MAGNESIUM: Magnesium: 2.2 mg/dL (ref 1.7–2.4)

## 2021-01-06 LAB — BASIC METABOLIC PANEL
Anion gap: 9 (ref 5–15)
BUN: 14 mg/dL (ref 6–20)
CO2: 31 mmol/L (ref 22–32)
Calcium: 8.5 mg/dL — ABNORMAL LOW (ref 8.9–10.3)
Chloride: 96 mmol/L — ABNORMAL LOW (ref 98–111)
Creatinine, Ser: 0.89 mg/dL (ref 0.44–1.00)
GFR, Estimated: >60 mL/min (ref >60)
Glucose, Bld: 94 mg/dL (ref 70–99)
Potassium: 4.2 mmol/L (ref 3.5–5.1)
Sodium: 136 mmol/L (ref 135–145)

## 2021-01-06 NOTE — Care Management Obs Status (Signed)
MEDICARE OBSERVATION STATUS NOTIFICATION   Patient Details  Name: [name] [name] MRN: [id number] Date of Birth: [birth year]   Medicare Observation Status Notification Given:  Yes    Lawerance Sabal, RN 01/06/2021, 9:27 AM

## 2021-01-06 NOTE — Progress Notes (Signed)
Patient ID: [name] [name], female   DOB: [birth year], 60 y.o.   MRN: [id number]     Advanced Heart Failure Rounding Note  PCP-Cardiologist: Nona Dell, MD   Subjective:    No complaints this morning, no further VT.   INR not done yet today. SBP 100s.    Objective:   Weight Range: 92.5 kg Body mass index is 33.43 kg/m.   Vital Signs:   Temp:  [97.7 F (36.5 C)-98.8 F (37.1 C)] 97.9 F (36.6 C) (01/08 0408) Pulse Rate:  [60-79] 60 (01/08 0408) Resp:  [14-16] 16 (01/08 0408) BP: (100-111)/(51-97) 103/52 (01/08 0408) SpO2:  [91 %-100 %] 91 % (01/08 0408)    Weight change: Filed Weights   01/04/21 2002  Weight: 92.5 kg    Intake/Output:   Intake/Output Summary (Last 24 hours) at 01/06/2021 1100 Last data filed at 01/06/2021 0411 Gross per 24 hour  Intake 720 ml  Output --  Net 720 ml      Physical Exam    General:  Well appearing. No resp difficulty HEENT: Normal Neck: Supple. JVP not elevated. Carotids 2+ bilat; no bruits. No lymphadenopathy or thyromegaly appreciated. Cor: PMI nondisplaced. Regular rate & rhythm. No rubs, gallops or murmurs. Lungs: Clear Abdomen: Soft, nontender, nondistended. No hepatosplenomegaly. No bruits or masses. Good bowel sounds. Extremities: No cyanosis, clubbing, rash, edema Neuro: Alert & orientedx3, cranial nerves grossly intact. moves all 4 extremities w/o difficulty. Affect pleasant   Telemetry   NSR 70s (personally reviewed)  Labs    CBC Recent Labs    01/04/21 2015 01/05/21 0308  WBC 10.2 10.1  HGB 13.3 13.3  HCT 40.5 42.1  MCV 93.1 95.2  PLT 214 182   Basic Metabolic Panel Recent Labs    78/29/56 0308 01/06/21 0220  NA 136 136  K 4.0 4.2  CL 96* 96*  CO2 27 31  GLUCOSE 67* 94  BUN 14 14  CREATININE 0.87 0.89  CALCIUM 9.0 8.5*  MG  --  2.2   Liver Function Tests No results for input(s): AST, ALT, ALKPHOS, BILITOT, PROT, ALBUMIN in the last 72 hours. No results for input(s): LIPASE, AMYLASE in  the last 72 hours. Cardiac Enzymes No results for input(s): CKTOTAL, CKMB, CKMBINDEX, TROPONINI in the last 72 hours.  BNP: BNP (last 3 results) Recent Labs    08/11/20 0230 12/14/20 1921  BNP 571.0* 209.0*    ProBNP (last 3 results) No results for input(s): PROBNP in the last 8760 hours.   D-Dimer No results for input(s): DDIMER in the last 72 hours. Hemoglobin A1C No results for input(s): HGBA1C in the last 72 hours. Fasting Lipid Panel No results for input(s): CHOL, HDL, LDLCALC, TRIG, CHOLHDL, LDLDIRECT in the last 72 hours. Thyroid Function Tests No results for input(s): TSH, T4TOTAL, T3FREE, THYROIDAB in the last 72 hours.  Invalid input(s): FREET3  Other results:   Imaging     No results found.   Medications:     Scheduled Medications: . amiodarone  400 mg Oral BID  . aspirin EC  81 mg Oral Daily  . carvedilol  6.25 mg Oral BID WC  . dapagliflozin propanediol  10 mg Oral Daily  . [START ON 01/07/2021] digoxin  0.0625 mg Oral Daily  . DULoxetine  30 mg Oral Daily  . DULoxetine  60 mg Oral QPM  . ezetimibe  10 mg Oral Daily  . famotidine  20 mg Oral Daily  . gabapentin  300 mg Oral Daily  .  gabapentin  600 mg Oral QHS  . mexiletine  150 mg Oral Q8H  . potassium chloride SA  20 mEq Oral Daily  . sacubitril-valsartan  1 tablet Oral BID  . [START ON 01/08/2021] sodium chloride flush  3 mL Intravenous Q12H  . spironolactone  25 mg Oral Daily  . torsemide  80 mg Oral Daily     Infusions:   PRN Medications:  acetaminophen, LORazepam, nitroGLYCERIN, ondansetron (ZOFRAN) IV, oxyCODONE-acetaminophen **AND** oxyCODONE, zolpidem   Assessment/Plan   1. CAD: MI in 2008 in [facility], cath at the time showed totally occluded diagonal, totally occluded LCX, and diffuse LAD disease. No intervention. LHC was done in 12/21 due to VT, this showed chronically occluded D1 and 95% proximal LCx. No intervention as she did not have chest pain and platelets were low. She is  now back with VT, ?scar mediated.  Troponin negative. Platelets now normal.  - Continue ASA 81 daily.  - Continue Zetia, has not tolerated statins. Good lipids in 12/21.  - Given recurrent VT, would recommend LCx intervention this admission.  INR 3.1 yesterday, will hold warfarin and cover with heparin gtt when INR < 2 (APLAS).  Plan for PCI to LCx on Monday morning.  - Check INR today.  2. Atrial fibrillation:TEE-guided DCCV to NSR in 12/21. She is in NSR today.  -If atrial fibrillation recurs, will need to consider ablation. - She is on amiodarone.  3. Carotid stenosis: Known RICA occlusion.Dopplers in 12/21 with <50% LICA. -Repeat dopplers in 12/22. 4. 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  - She is on ASA 81 and warfarin => holding warfarin for PCI, cover with heparin gtt when INR < 2 (needs INR today).  5. Chronic systolic CHF: Ischemic cardiomyopathy. [employer] ICD. Echo (8/21)with EF 15-20%, anteroseptal AK, RV normal, severe LAE, mild MR. TEE (12/21) with EF 25-30%. RHC in 12/21 with preserved cardiac output and optimized filling pressures. NYHA class II symptoms, not volume overloaded on exam.SBP in 100s, this is typical for her.  -Continue Entresto 24/26 bid. -ContinueFarxiga 10 mg daily.  -Continue spironolactone 25 mg daily. - Continue Coreg6.25mg  bid.  - Continue torsemide 80 mg daily.  - Continue digoxin 0.125 daily, digoxin level 0.9.  6. Smoking: Active smoker, wants to stop on her own.  7. PAD: She denies claudication.  - Needs to quit smoking. 8. Thrombocytopenia: Chronic and mild. Down to 70K during last hospitalization. Now up to 182.  Adequate for PCI.  9. Mitral regurgitation: Moderate on 12/21 TEE, suspect functional.  10. VT: Suspect scar-mediated in 12/21 with ICD discharge and syncope.  Again with VT 01/03/21 with ICD discharge, did not pass out.  Troponin not elevated.  - No driving x 6 months.  - Reloading  amiodarone and adding mexiletine per EP.  - As above, plan to intervene on LCx.  11. PVCs: Frequent, multiple morphologies so probably not amenable to ablation.  - Antiarrhythmics as above.   Length of Stay: 0  [name], MD  01/06/2021, 11:00 AM  Advanced Heart Failure Team Pager [phone] (M-F; 7a - 4p)  Please contact [facility] for night-coverage after hours (4p -7a ) and weekends on [email or web address]

## 2021-01-07 DIAGNOSIS — D696 Thrombocytopenia, unspecified: Secondary | ICD-10-CM

## 2021-01-07 DIAGNOSIS — D693 Immune thrombocytopenic purpura: Secondary | ICD-10-CM | POA: Diagnosis not present

## 2021-01-07 DIAGNOSIS — Z7982 Long term (current) use of aspirin: Secondary | ICD-10-CM

## 2021-01-07 DIAGNOSIS — I255 Ischemic cardiomyopathy: Secondary | ICD-10-CM | POA: Diagnosis not present

## 2021-01-07 DIAGNOSIS — I472 Ventricular tachycardia: Principal | ICD-10-CM

## 2021-01-07 DIAGNOSIS — I739 Peripheral vascular disease, unspecified: Secondary | ICD-10-CM

## 2021-01-07 DIAGNOSIS — I671 Cerebral aneurysm, nonruptured: Secondary | ICD-10-CM

## 2021-01-07 DIAGNOSIS — Z720 Tobacco use: Secondary | ICD-10-CM

## 2021-01-07 DIAGNOSIS — I2510 Atherosclerotic heart disease of native coronary artery without angina pectoris: Secondary | ICD-10-CM | POA: Diagnosis not present

## 2021-01-07 DIAGNOSIS — D6861 Antiphospholipid syndrome: Secondary | ICD-10-CM

## 2021-01-07 DIAGNOSIS — Z4502 Encounter for adjustment and management of automatic implantable cardiac defibrillator: Secondary | ICD-10-CM

## 2021-01-07 DIAGNOSIS — Z7901 Long term (current) use of anticoagulants: Secondary | ICD-10-CM

## 2021-01-07 DIAGNOSIS — Z8673 Personal history of transient ischemic attack (TIA), and cerebral infarction without residual deficits: Secondary | ICD-10-CM

## 2021-01-07 LAB — CBC
HCT: 37.9 % (ref 36.0–46.0)
Hemoglobin: 12.5 g/dL (ref 12.0–15.0)
MCH: 30.3 pg (ref 26.0–34.0)
MCHC: 33 g/dL (ref 30.0–36.0)
MCV: 92 fL (ref 80.0–100.0)
Platelets: 112 10*3/uL — ABNORMAL LOW (ref 150–400)
RBC: 4.12 MIL/uL (ref 3.87–5.11)
RDW: 13.3 % (ref 11.5–15.5)
WBC: 7.3 10*3/uL (ref 4.0–10.5)
nRBC: 0 % (ref 0.0–0.2)

## 2021-01-07 LAB — PROTIME-INR
INR: 1.7 — ABNORMAL HIGH (ref 0.8–1.2)
Prothrombin Time: 19.2 seconds — ABNORMAL HIGH (ref 11.4–15.2)

## 2021-01-07 LAB — SAVE SMEAR(SSMR), FOR PROVIDER SLIDE REVIEW

## 2021-01-07 LAB — APTT
aPTT: 39 seconds — ABNORMAL HIGH (ref 24–36)
aPTT: 51 seconds — ABNORMAL HIGH (ref 24–36)
aPTT: 113 seconds — ABNORMAL HIGH (ref 24–36)

## 2021-01-07 LAB — BASIC METABOLIC PANEL
Anion gap: 10 (ref 5–15)
BUN: 11 mg/dL (ref 6–20)
CO2: 30 mmol/L (ref 22–32)
Calcium: 8.7 mg/dL — ABNORMAL LOW (ref 8.9–10.3)
Chloride: 92 mmol/L — ABNORMAL LOW (ref 98–111)
Creatinine, Ser: 0.96 mg/dL (ref 0.44–1.00)
GFR, Estimated: >60 mL/min (ref >60)
Glucose, Bld: 96 mg/dL (ref 70–99)
Potassium: 3.8 mmol/L (ref 3.5–5.1)
Sodium: 132 mmol/L — ABNORMAL LOW (ref 135–145)

## 2021-01-07 LAB — MAGNESIUM: Magnesium: 2.1 mg/dL (ref 1.7–2.4)

## 2021-01-07 LAB — PLATELET COUNT: Platelets: 117 10*3/uL — ABNORMAL LOW (ref 150–400)

## 2021-01-07 MED ORDER — SODIUM CHLORIDE 0.9 % IV SOLN: INTRAVENOUS | Status: DC

## 2021-01-07 MED ORDER — HEPARIN (PORCINE) 25000 UT/250ML-% IV SOLN
1100.0000 [IU]/h | INTRAVENOUS | Status: DC
  Filled 2021-01-07: qty 250

## 2021-01-07 MED ORDER — ASPIRIN 81 MG PO CHEW
81.0000 mg | CHEWABLE_TABLET | ORAL | Status: AC
  Administered 2021-01-08: 81 mg via ORAL
  Filled 2021-01-07: qty 1

## 2021-01-07 MED ORDER — ZOLPIDEM TARTRATE 5 MG PO TABS: 5.0000 mg | ORAL_TABLET | Freq: Every evening | ORAL | Status: DC | PRN

## 2021-01-07 MED ORDER — SODIUM CHLORIDE 0.9 % IV SOLN: 250.0000 mL | INTRAVENOUS | Status: DC | PRN

## 2021-01-07 MED ORDER — SODIUM CHLORIDE 0.9 % IV SOLN
0.1800 mg/kg/h | INTRAVENOUS | Status: DC
  Administered 2021-01-07: 0.25 mg/kg/h via INTRAVENOUS
  Administered 2021-01-07: 0.18 mg/kg/h via INTRAVENOUS
  Administered 2021-01-07: 0.25 mg/kg/h via INTRAVENOUS
  Filled 2021-01-07 (×3): qty 250

## 2021-01-07 MED ORDER — SODIUM CHLORIDE 0.9% FLUSH: 3.0000 mL | INTRAVENOUS | Status: DC | PRN

## 2021-01-07 NOTE — Progress Notes (Signed)
Talked to Dr. Truett Perna and have ordered platelets to be drawn into citrate or heparin tube and discussed with phlebotomist   I ordered Stat.

## 2021-01-07 NOTE — Progress Notes (Deleted)
ANTICOAGULATION CONSULT NOTE - Initial Consult  Pharmacy Consult for heparin Indication: atrial fibrillation  Patient Measurements: Height: 5' 5.5" (166.4 cm) Weight: 92.5 kg (204 lb) IBW/kg (Calculated) : 58.15 Heparin Dosing Weight: 78.6 kg  Vital Signs: Temp: 97.9 F (36.6 C) (01/09 0451) Temp Source: Oral (01/09 0451) BP: 127/78 (01/09 0451) Pulse Rate: 69 (01/09 0451)  Labs: Recent Labs    01/04/21 2015 01/04/21 2042 01/04/21 2205 01/05/21 0033 01/05/21 0308 01/06/21 0220 01/06/21 1423 01/07/21 0403  HGB 13.3  --   --   --  13.3  --   --  12.5  HCT 40.5  --   --   --  42.1  --   --  37.9  PLT 214  --   --   --  182  --   --  112*  LABPROT  --    < >  --   --  31.1*  --  22.1* 19.2*  INR  --    < >  --   --  3.1*  --  2.0* 1.7*  CREATININE 0.85  --   --   --  0.87 0.89  --  0.96  TROPONINIHS 19*  --  17 16 17   --   --   --    < > = values in this interval not displayed.    Estimated Creatinine Clearance: 73.4 mL/min (by C-G formula based on SCr of 0.96 mg/dL).   Medical History: Past Medical History:  Diagnosis Date  . Aneurysm of internal carotid artery   . Anticardiolipin antibody positive    Chronic Coumadin  . Brain aneurysm    followed by Dr.  . Chronic combined systolic (congestive) and diastolic (congestive) heart failure (HCC)   . Cocaine abuse in remission (HCC)   . Coronary atherosclerosis of native coronary artery    Previous invasive cardiac testing was done at a facility in [place], [place].  Occluded diagonal, Diffuse LAD disease, Occluded Cx, and non obstructive RCA.   . Essential hypertension   . Headache   . History of pneumonia   . ICD (implantable cardioverter-defibrillator) in place   . Ischemic cardiomyopathy    LVEF 30-35%  . Mixed hyperlipidemia   . NSVT (nonsustained ventricular tachycardia) (HCC)   . Persistent atrial fibrillation (HCC)   . PVD (peripheral vascular disease) (HCC)   . Raynaud's phenomenon   .  Statin intolerance   . Stroke [facility]) 2007   Total occlusion of the right internal carotid   Assessment: 59yof admitted after ICD shock for VT.  Hx HF, Afib s/i DCCV >SR 12/21, and CAD. Pt takes warfarin outpatient for anticoagulation which has been held (outpatient warfarin regimen is 5mg  Mon, Wed, Fri, Sat and 10mg  Tues, Thurs, Sun). Pharmacy consulted to start heparin infusion for anticoagulation when INR <2.  INR (01/07/21): 1.7  CBC: Hgb 12.5, Plt 112  Goal of Therapy:  Heparin level 0.3-0.7 units/ml Monitor platelets by anticoagulation protocol: Yes   Plan:  No heparin bolus Start heparin infusion at 1100 units / hr 6hr HL at 1500  and daily heparin levels Monitor CBC, HL, s/sx bleeding  Follow up restart of oral anticoagulation   03-06-2001, PharmD PGY1 Pharmacy Resident 01/07/2021 8:04 AM

## 2021-01-07 NOTE — Progress Notes (Signed)
ANTICOAGULATION CONSULT NOTE - Initial Consult  Pharmacy Consult for bivalirudin Indication: chest pain/ACS and atrial fibrillation  Allergies  Allergen Reactions  . Beef-Derived Products Anaphylaxis    From tick bite  . Metoclopramide Hcl Anaphylaxis    Non responsive  . Penicillins Anaphylaxis    Pt states she can take any cephalosporin (per pt. 01/01/19) Shock Has patient had a PCN reaction causing immediate rash, facial/tongue/throat swelling, SOB or lightheadedness with hypotension: Yes Has patient had a PCN reaction causing severe rash involving mucus membranes or skin necrosis: No Has patient had a PCN reaction that required hospitalization Yes Has patient had a PCN reaction occurring within the last 10 years: No If all of the above answers are "NO", then may proceed with Cephalosporin   . Pork-Derived Products Anaphylaxis    From tick bite. Has tolerated heparin  . Metoprolol Other (See Comments)    Syncope   . Reglan [Metoclopramide]   . Statins Other (See Comments)    Myalgias    Patient Measurements: Height: 5' 5.5" (166.4 cm) Weight: 92.5 kg (204 lb) IBW/kg (Calculated) : 58.15 Heparin Dosing Weight: 78.6 kg   Vital Signs: Temp: 97.9 F (36.6 C) (01/09 0451) Temp Source: Oral (01/09 0451) BP: 127/78 (01/09 0451) Pulse Rate: 67 (01/09 0837)  Labs: Recent Labs    01/04/21 2015 01/04/21 2042 01/04/21 2205 01/05/21 0033 01/05/21 0308 01/06/21 0220 01/06/21 1423 01/07/21 0403  HGB 13.3  --   --   --  13.3  --   --  12.5  HCT 40.5  --   --   --  42.1  --   --  37.9  PLT 214  --   --   --  182  --   --  112*  LABPROT  --    < >  --   --  31.1*  --  22.1* 19.2*  INR  --    < >  --   --  3.1*  --  2.0* 1.7*  CREATININE 0.85  --   --   --  0.87 0.89  --  0.96  TROPONINIHS 19*  --  17 16 17   --   --   --    < > = values in this interval not displayed.    Estimated Creatinine Clearance: 73.4 mL/min (by C-G formula based on SCr of 0.96  mg/dL).   Assessment: 59yof admitted after ICD shock for VT. Hx HF, Afib s/i DCCV >SR 12/21, and CAD. Pt takes warfarin outpatient for anticoagulation which has been held (outpatient warfarin regimen is 5mg  Mon, Wed, Fri, Sat and 10mg  Tues, Thurs, Sun). Pt has PMH of thrombocytopenia and platelets have decreased from 214 on admission on 1/6 to 112 this morning.   Therefore pharmacy is now consulted to start bivalirudin infusion prior to PCI which is planned for Monday 01/08/21.   INR (01/07/21) 1.7 CBC: Hgb 12.5, Plt 112  Goal of Therapy:  aPTT 50-85 seconds Monitor platelets by anticoagulation protocol: Yes   Plan:  No bolus Start bivalirudin at a rate of 0.25mg /kg/hr (23.12mg /hr)  Monitor aPTT, s/sx bleeding, and CBC 4hr aPTT at 1300   Sunday, PharmD PGY1 Pharmacy Resident 01/07/2021 8:48 AM

## 2021-01-07 NOTE — H&P (View-Only) (Signed)
Patient ID: [name] [name], female   DOB: 08/10/1961, 60 y.o.   MRN: [id number]     Advanced Heart Failure Rounding Note  PCP-Cardiologist: Samuel McDowell, MD   Subjective:    No complaints this morning, no further VT.   INR 1.7.  BP stable.  Platelets have dropped to 112.    Objective:   Weight Range: 92.5 kg Body mass index is 33.43 kg/m.   Vital Signs:   Temp:  [97.7 F (36.5 C)-98.4 F (36.9 C)] 97.9 F (36.6 C) (01/09 0451) Pulse Rate:  [59-69] 67 (01/09 0837) Resp:  [15-16] 16 (01/09 0451) BP: (91-127)/(46-78) 104/61 (01/09 0849) SpO2:  [96 %-99 %] 99 % (01/09 0451)    Weight change: Filed Weights   01/04/21 2002  Weight: 92.5 kg    Intake/Output:   Intake/Output Summary (Last 24 hours) at 01/07/2021 1018 Last data filed at 01/07/2021 0452 Gross per 24 hour  Intake 240 ml  Output --  Net 240 ml      Physical Exam    General: NAD Neck: No JVD, no thyromegaly or thyroid nodule.  Lungs: Clear to auscultation bilaterally with normal respiratory effort. CV: Nondisplaced PMI.  Heart regular S1/S2, no S3/S4, no murmur.  No peripheral edema.   Abdomen: Soft, nontender, no hepatosplenomegaly, no distention.  Skin: Intact without lesions or rashes.  Neurologic: Alert and oriented x 3.  Psych: Normal affect. Extremities: No clubbing or cyanosis.  HEENT: Normal.    Telemetry   NSR 60s (personally reviewed)  Labs    CBC Recent Labs    01/05/21 0308 01/07/21 0403  WBC 10.1 7.3  HGB 13.3 12.5  HCT 42.1 37.9  MCV 95.2 92.0  PLT 182 112*   Basic Metabolic Panel Recent Labs    01/06/21 0220 01/07/21 0403  NA 136 132*  K 4.2 3.8  CL 96* 92*  CO2 31 30  GLUCOSE 94 96  BUN 14 11  CREATININE 0.89 0.96  CALCIUM 8.5* 8.7*  MG 2.2 2.1   Liver Function Tests No results for input(s): AST, ALT, ALKPHOS, BILITOT, PROT, ALBUMIN in the last 72 hours. No results for input(s): LIPASE, AMYLASE in the last 72 hours. Cardiac Enzymes No results for  input(s): CKTOTAL, CKMB, CKMBINDEX, TROPONINI in the last 72 hours.  BNP: BNP (last 3 results) Recent Labs    08/11/20 0230 12/14/20 1921  BNP 571.0* 209.0*    ProBNP (last 3 results) No results for input(s): PROBNP in the last 8760 hours.   D-Dimer No results for input(s): DDIMER in the last 72 hours. Hemoglobin A1C No results for input(s): HGBA1C in the last 72 hours. Fasting Lipid Panel No results for input(s): CHOL, HDL, LDLCALC, TRIG, CHOLHDL, LDLDIRECT in the last 72 hours. Thyroid Function Tests No results for input(s): TSH, T4TOTAL, T3FREE, THYROIDAB in the last 72 hours.  Invalid input(s): FREET3  Other results:   Imaging    No results found.   Medications:     Scheduled Medications: . amiodarone  400 mg Oral BID  . aspirin EC  81 mg Oral Daily  . carvedilol  6.25 mg Oral BID WC  . dapagliflozin propanediol  10 mg Oral Daily  . digoxin  0.0625 mg Oral Daily  . DULoxetine  30 mg Oral Daily  . DULoxetine  60 mg Oral QPM  . ezetimibe  10 mg Oral Daily  . famotidine  20 mg Oral Daily  . gabapentin  300 mg Oral Daily  . gabapentin  600 mg   Oral QHS  . mexiletine  150 mg Oral Q8H  . potassium chloride SA  20 mEq Oral Daily  . sacubitril-valsartan  1 tablet Oral BID  . [START ON 01/08/2021] sodium chloride flush  3 mL Intravenous Q12H  . spironolactone  25 mg Oral Daily  . torsemide  80 mg Oral Daily    Infusions: . bivalirudin (ANGIOMAX) infusion 5 mg/mL (Cath Lab,ACS,PCI indication)      PRN Medications: acetaminophen, LORazepam, nitroGLYCERIN, ondansetron (ZOFRAN) IV, oxyCODONE-acetaminophen **AND** oxyCODONE, zolpidem   Assessment/Plan   1. CAD: MI in 2008 in [facility], cath at the time showed totally occluded diagonal, totally occluded LCX, and diffuse LAD disease. No intervention. LHC was done in 12/21 due to VT, this showed chronically occluded D1 and 95% proximal LCx. No intervention as she did not have chest pain and platelets were low. She is  now back with VT, ?scar mediated.  Troponin negative. Platelets normal at admission but have dropped again.  - Continue ASA 81 daily.  - Continue Zetia, has not tolerated statins. Good lipids in 12/21.  - Given recurrent VT, would recommend LCx intervention this admission.  INR 1.7, holding warfarin for PCI and covering with bivalirudin given fall in platelets (APLAS).  Plan for PCI to LCx on Monday morning if platelets stabilize.  2. Atrial fibrillation:TEE-guided DCCV to NSR in 12/21. She is in NSR today.  -If atrial fibrillation recurs, will need to consider ablation. - She is on amiodarone.  3. Carotid stenosis: Known RICA occlusion.Dopplers in 12/21 with <50% LICA. -Repeat dopplers in 12/22. 4. 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  - She is on ASA 81 and warfarin => holding warfarin for PCI, covering with bivalirudin rather than heparin gtt now that INR < 2.  5. Chronic systolic CHF: Ischemic cardiomyopathy. [employer] ICD. Echo (8/21)with EF 15-20%, anteroseptal AK, RV normal, severe LAE, mild MR. TEE (12/21) with EF 25-30%. RHC in 12/21 with preserved cardiac output and optimized filling pressures. NYHA class II symptoms, not volume overloaded on exam.BP stable.   -Continue Entresto 24/26 bid. -ContinueFarxiga 10 mg daily.  -Continue spironolactone 25 mg daily. - Continue Coreg6.25mg  bid.  - Continue torsemide 80 mg daily.  - Continue digoxin 0.125 daily, digoxin level 0.9.  6. Smoking: Active smoker, wants to stop on her own.  7. PAD: She denies claudication.  - Needs to quit smoking. 8. Thrombocytopenia: Dropped to 70K during last hospitalization with uncertain etiology.  Increased back to normal range after discharge. Plts > 200K at admission, now dropped to 112K.  She has history of APLAS.  She has not had heparin products so not HIT.  Only new med is mexiletine; she was not on this, however, when plts fell last admission.   Post-PCI, she will need ASA 81 x 1 month and Plavix at least 6 months.  Could stop ASA earlier if needed.  - I will ask hematology to see her, probably need to look at smear (?platelets clumping).   9. Mitral regurgitation: Moderate on 12/21 TEE, suspect functional.  10. VT: Suspect scar-mediated in 12/21 with ICD discharge and syncope.  Again with VT 01/03/21 with ICD discharge, did not pass out.  Troponin not elevated.  - No driving x 6 months.  - Reloading amiodarone and adding mexiletine per EP.  - As above, plan to intervene on LCx.  11. PVCs: Frequent, multiple morphologies so probably not amenable to ablation.  - Antiarrhythmics as above.   Length of Stay: 1  Kemonie Cutillo  Shirlee Latch, MD  01/07/2021, 10:18 AM  Advanced Heart Failure Team Pager [phone] (M-F; 7a - 4p)  Please contact [facility] for night-coverage after hours (4p -7a ) and weekends on [email or web address]

## 2021-01-07 NOTE — Consult Note (Signed)
New Hematology/Oncology Consult   Requesting IF:OYDXAJ Shirlee Latch        Reason for Consult: Thrombocytopenia  HPI: Ms. Elster has a complex medical history including a history of antiphospholipid syndrome, heart failure, and coronary artery disease.  She was admitted on 01/04/2021 after an AICD discharge.  She was not aware of the event. She was admitted in December after a syncope event, ventricular tachycardia was confirmed on AICD interrogation and she received 1 shock.  A cardiac catheterization revealed a 95% left circumflex stenosis.  PCI was deferred due to need for Coumadin and aspirin and thrombocytopenia.  Following cardiac catheterization she was restarted on Coumadin with a heparin bridge.  She was discharged on Lovenox.  She was discharged to home 12/19/2020 with the plan to take Lovenox for 10 days.  During the 12/14/2020-12/19/2020 hospital admission The platelet count ranged between 70 and 100,000.  She reports a history of thrombocytopenia dating at least to 2010 when she was admitted for an L1 corpectomy procedure following trauma.  She was followed by Dr. Darrold Span at the time (no records available today).  She is maintained on chronic Coumadin anticoagulation after CVA.  She was diagnosed with antiphospholipid antibody syndrome in the remote past by neurology at [facility].   The platelet count dropped to 20,000 when she was admitted with pneumonia and influenza a in December 2019.  She was seen by Dr. Melton Alar.  The thrombocytopenia was felt to be related to infection.  The platelet count quickly recovered into the normal range.  Review of the medical record reveals intermittent mild thrombocytopenia for years.    Past Medical History:  Diagnosis Date  . Aneurysm of internal carotid artery   . Anticardiolipin antibody positive    Chronic Coumadin  . Brain aneurysm    followed by Dr. Corliss Skains  . Chronic combined systolic (congestive) and diastolic (congestive) heart  failure (HCC)   . Cocaine abuse in remission (HCC)   . Coronary atherosclerosis of native coronary artery    Previous invasive cardiac testing was done at a facility in [place], [place].  Occluded diagonal, Diffuse LAD disease, Occluded Cx, and non obstructive RCA.   . Essential hypertension   . Headache   . History of pneumonia   . ICD (implantable cardioverter-defibrillator) in place   . Ischemic cardiomyopathy    LVEF 30-35%  . Mixed hyperlipidemia   . NSVT (nonsustained ventricular tachycardia) (HCC)   . Persistent atrial fibrillation (HCC)   . PVD (peripheral vascular disease) (HCC)   . Raynaud's phenomenon   . Statin intolerance   . Stroke [facility]) 2007   Total occlusion of the right internal carotid  :  .  G2P0  Past Surgical History:  Procedure Laterality Date  . ABDOMINAL AORTAGRAM N/A 05/25/2014   Procedure: ABDOMINAL Ronny Flurry;  Surgeon: Iran Ouch, MD;  Location: [facility] CATH LAB;  Service: Cardiovascular;  Laterality: N/A;  . APPENDECTOMY    . CARDIAC DEFIBRILLATOR PLACEMENT     [employer] ICD  . CARDIOVERSION N/A 12/01/2020   Procedure: CARDIOVERSION;  Surgeon: [name], MD;  Location: [facility] ENDOSCOPY;  Service: Cardiovascular;  Laterality: N/A;  . EP IMPLANTABLE DEVICE N/A 03/01/2016   Procedure: ICD Generator Changeout;  Surgeon: [name], MD;  Location: [facility] INVASIVE CV LAB;  Service: Cardiovascular;  Laterality: N/A;  . L1 corpectomy   12/10  . Laryngeal polyp excision    . RIGHT/LEFT HEART CATH AND CORONARY ANGIOGRAPHY N/A 12/18/2020   Procedure: RIGHT/LEFT HEART CATH AND  CORONARY ANGIOGRAPHY;  Surgeon: Dolores Patty, MD;  Location: [facility] INVASIVE CV LAB;  Service: Cardiovascular;  Laterality: N/A;  . TEE WITHOUT CARDIOVERSION N/A 12/01/2020   Procedure: TRANSESOPHAGEAL ECHOCARDIOGRAM (TEE);  Surgeon: [name], MD;  Location: [facility] ENDOSCOPY;  Service: Cardiovascular;  Laterality: N/A;  :   Current Facility-Administered Medications:  .   acetaminophen (TYLENOL) tablet 650 mg, 650 mg, Oral, Q4H PRN, Precious Reel, MD .  amiodarone (PACERONE) tablet 400 mg, 400 mg, Oral, BID, Graciella Freer, PA-C, 400 mg at 01/07/21 0973 .  aspirin EC tablet 81 mg, 81 mg, Oral, Daily, Precious Reel, MD, 81 mg at 01/07/21 0853 .  bivalirudin (ANGIOMAX) 250 mg in sodium chloride 0.9 % 50 mL (5 mg/mL) infusion, 0.25 mg/kg/hr, Intravenous, Continuous, Calton Dach I, RPH, Last Rate: 4.6 mL/hr at 01/07/21 1115, 0.25 mg/kg/hr at 01/07/21 1115 .  carvedilol (COREG) tablet 6.25 mg, 6.25 mg, Oral, BID WC, Precious Reel, MD, 6.25 mg at 01/07/21 0850 .  dapagliflozin propanediol (FARXIGA) tablet 10 mg, 10 mg, Oral, Daily, Precious Reel, MD, 10 mg at 01/07/21 0851 .  digoxin (LANOXIN) tablet 0.0625 mg, 0.0625 mg, Oral, Daily, Little Ishikawa, MD, 0.0625 mg at 01/07/21 0852 .  DULoxetine (CYMBALTA) DR capsule 30 mg, 30 mg, Oral, Daily, Precious Reel, MD, 30 mg at 01/07/21 0853 .  DULoxetine (CYMBALTA) DR capsule 60 mg, 60 mg, Oral, QPM, Precious Reel, MD, 60 mg at 01/06/21 1638 .  ezetimibe (ZETIA) tablet 10 mg, 10 mg, Oral, Daily, Precious Reel, MD, 10 mg at 01/07/21 0850 .  famotidine (PEPCID) tablet 20 mg, 20 mg, Oral, Daily, Precious Reel, MD, 20 mg at 01/07/21 0851 .  gabapentin (NEURONTIN) capsule 300 mg, 300 mg, Oral, Daily, Precious Reel, MD, 300 mg at 01/07/21 0850 .  gabapentin (NEURONTIN) capsule 600 mg, 600 mg, Oral, QHS, Little Ishikawa, MD, 600 mg at 01/06/21 2112 .  LORazepam (ATIVAN) tablet 0.5 mg, 0.5 mg, Oral, TID PRN, Precious Reel, MD .  mexiletine (MEXITIL) capsule 150 mg, 150 mg, Oral, Q8H, Graciella Freer, PA-C, 150 mg at 01/07/21 1426 .  nitroGLYCERIN (NITROSTAT) SL tablet 0.4 mg, 0.4 mg, Sublingual, Q5 Min x 3 PRN, Precious Reel, MD .  ondansetron [facility]) injection 4 mg, 4 mg, Intravenous, Q6H PRN,  Precious Reel, MD .  oxyCODONE-acetaminophen (PERCOCET/ROXICET) 5-325 MG per tablet 1 tablet, 1 tablet, Oral, Q4H PRN, 1 tablet at 01/07/21 0853 **AND** oxyCODONE (Oxy IR/ROXICODONE) immediate release tablet 5 mg, 5 mg, Oral, Q4H PRN, Little Ishikawa, MD, 5 mg at 01/07/21 0851 .  potassium chloride SA (KLOR-CON) CR tablet 20 mEq, 20 mEq, Oral, Daily, Precious Reel, MD, 20 mEq at 01/07/21 0851 .  sacubitril-valsartan (ENTRESTO) 24-26 mg per tablet, 1 tablet, Oral, BID, Precious Reel, MD, 1 tablet at 01/07/21 [id number] .  [START ON 01/08/2021] sodium chloride flush (NS) 0.9 % injection 3 mL, 3 mL, Intravenous, Q12H, Simmons, Brittainy M, PA-C .  spironolactone (ALDACTONE) tablet 25 mg, 25 mg, Oral, Daily, Precious Reel, MD, 25 mg at 01/07/21 0851 .  torsemide (DEMADEX) tablet 80 mg, 80 mg, Oral, Daily, Precious Reel, MD, 80 mg at 01/07/21 0850 .  zolpidem (AMBIEN) tablet 5 mg, 5 mg, Oral, QHS PRN, Precious Reel, MD:  . amiodarone  400 mg Oral BID  . aspirin EC  81 mg Oral Daily  . carvedilol  6.25 mg Oral BID WC  .  dapagliflozin propanediol  10 mg Oral Daily  . digoxin  0.0625 mg Oral Daily  . DULoxetine  30 mg Oral Daily  . DULoxetine  60 mg Oral QPM  . ezetimibe  10 mg Oral Daily  . famotidine  20 mg Oral Daily  . gabapentin  300 mg Oral Daily  . gabapentin  600 mg Oral QHS  . mexiletine  150 mg Oral Q8H  . potassium chloride SA  20 mEq Oral Daily  . sacubitril-valsartan  1 tablet Oral BID  . [START ON 01/08/2021] sodium chloride flush  3 mL Intravenous Q12H  . spironolactone  25 mg Oral Daily  . torsemide  80 mg Oral Daily  :  Allergies  Allergen Reactions  . Beef-Derived Products Anaphylaxis    From tick bite  . Metoclopramide Hcl Anaphylaxis    Non responsive  . Penicillins Anaphylaxis    Pt states she can take any cephalosporin (per pt. 01/01/19) Shock Has patient had a PCN reaction causing immediate rash,  facial/tongue/throat swelling, SOB or lightheadedness with hypotension: Yes Has patient had a PCN reaction causing severe rash involving mucus membranes or skin necrosis: No Has patient had a PCN reaction that required hospitalization Yes Has patient had a PCN reaction occurring within the last 10 years: No If all of the above answers are "NO", then may proceed with Cephalosporin   . Pork-Derived Products Anaphylaxis    From tick bite. Has tolerated heparin  . Metoprolol Other (See Comments)    Syncope   . Reglan [Metoclopramide]   . Statins Other (See Comments)    Myalgias  :  WP:YKDXIP- Uterine Cancer, Brother- peritoneal cancer  SOCIAL HISTORY: She live aloe in [place]. Smokes cigarettes.  No ETOH. No risk factor for HIV or Hepatitis  Review of Systems:  Positives include: occasional dysphagia, 24lb wt. Loss over past 3 months-partly intentional, pain at Rt. knee  A complete ROS was otherwise negative.   Physical Exam:  Blood pressure (!) 101/57, pulse 60, temperature 97.6 F (36.4 C), temperature source Oral, resp. rate 15, height 5' 5.5" (1.664 m), weight 204 lb (92.5 kg), SpO2 96 %.  HEENT: no thrush Lungs: clear bilaterally Cardiac: RRR, distant heart sounds Abdomen: No HSM, no mass, nontender  Vascular: no leg edema Lymph nodes: no cervical, supraclavicular, axillary, or inguinal nodes Neurologic: alert and orient, no gross motor deficits Skin: ecchymoses at abdominal wall injection sites, few small ecchymoses over extremities3 Musculoskeletal: no spine tenderness, rt. Knee without effusion or erythrema  LABS:  Recent Labs    01/05/21 0308 01/07/21 0403 01/07/21 1207  WBC 10.1 7.3  --   HGB 13.3 12.5  --   HCT 42.1 37.9  --   PLT 182 112* 117*    Recent Labs    01/06/21 0220 01/07/21 0403  NA 136 132*  K 4.2 3.8  CL 96* 92*  CO2 31 30  GLUCOSE 94 96  BUN 14 11  CREATININE 0.89 0.96  CALCIUM 8.5* 8.7*   Blood smear 01/07/2021: Few ovalocytes, the  red cell morphology is otherwise unremarkable, the polychromasia is not increased.  The white cells appear normal.  Mild decrease in platelet number, no significant platelet clumps.  Few large platelets.  The majority the platelets are small.   RADIOLOGY:  CARDIAC CATHETERIZATION  Result Date: 12/18/2020  1st Diag lesion is 100% stenosed.  Mid LAD lesion is 45% stenosed.  Prox Cx to Mid Cx lesion is 95% stenosed.  Prox RCA lesion is 30%  stenosed.  Findings: Ao = 125/65 (88) LV = 127/13 RA =  1 RV = 27/4 PA = 24/6 (14) PCW = 12 Fick cardiac output/index = 6.2/3.0 PVR = 0.3 WU Ao sat = 99% PA sat = 76%, 77% High SVC sat = 80% Assessment: 1. 2v CAD with 95% mid LCX and chronically occluded D1 2. EF 25% 3. Well compensated hemodynamics with high cardiac output. 4. No evidence of intracardiac shunting Plan/Discussion: Cath results very similar to cath report from 2008 though LCx is 95% stenosed (not 100% as previously reported). I reviewed cath with Dr. Excell Seltzerooper. Lcx is amenable to PCI but with dropping platelets and absolute need for warfarin and ASA with anti-phospholipid syndrome and recent DC-CV will defer PCI for now particularly in light of normal hstrop. Can reconsider in January or February [facility] rebounded. Arvilla Meresaniel Bensimhon, MD 10:35 AM   US Carotid Bilateral  Result Date: 12/15/2020 CLINICAL DATA:  Right carotid artery occlusion. EXAM: BILATERAL CAROTID DUPLEX ULTRASOUND TECHNIQUE: Wallace CullensGray scale imaging, color Doppler and duplex ultrasound were performed of bilateral carotid and vertebral arteries in the neck. COMPARISON:  10/07/2014 and CT a 07/28/2017 FINDINGS: Criteria: Quantification of carotid stenosis is based on velocity parameters that correlate the residual internal carotid diameter with NASCET-based stenosis levels, using the diameter of the distal internal carotid lumen as the denominator for stenosis measurement. The following velocity measurements were  obtained: RIGHT ICA: Occluded CCA: 67/7 cm/sec ECA: 290 cm/sec LEFT ICA: 128/25 cm/sec CCA: 93/19 cm/sec SYSTOLIC ICA/CCA RATIO:  0.4 ECA: 198 cm/sec RIGHT CAROTID ARTERY: Echogenic plaque at the right carotid bulb. External carotid artery is patent with normal waveform. Chronic occlusion of the proximal internal carotid artery. RIGHT VERTEBRAL ARTERY: Antegrade flow and normal waveform in the right vertebral artery. LEFT CAROTID ARTERY: Echogenic plaque at the left carotid bulb. External carotid artery is patent with normal waveform. Small amount of plaque in the proximal internal carotid artery. Normal waveforms in the internal carotid artery. LEFT VERTEBRAL ARTERY: Antegrade flow and normal waveform in the left vertebral artery. IMPRESSION: 1. 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 2. Small amount of plaque in the left internal carotid artery. Peak systolic velocity in the left internal artery has slightly elevated, now measuring up to 128 cm/sec. Estimated degree of stenosis in the left internal carotid artery is less than 50% based on the carotid artery ratio but recommend continued surveillance. 3. Patent vertebral arteries with antegrade flow. 4. Bilateral external carotid arteries are patent. Again noted is an elevated peak systolic velocity in the right external carotid artery. Electronically Signed   By: Richarda OverlieAdam  Henn M.D.   On: 12/15/2020 12:50   DG Chest Port 1 View  Result Date: 01/04/2021 CLINICAL DATA:  Pain EXAM: PORTABLE CHEST 1 VIEW COMPARISON:  12/14/2020 FINDINGS: There is a dual chamber left-sided pacemaker/ICD in place. There is stable scarring in the mid lung zones bilaterally. There is no pneumothorax. There is persistent cardiomegaly. No large pleural effusion. No acute osseous abnormality. IMPRESSION: No active disease. Electronically Signed   By: Katherine Mantlehristopher  Green M.D.   On: 01/04/2021 21:13   DG Chest Portable 1 View  Result Date: 12/14/2020 CLINICAL DATA:  Syncope  syncope EXAM: PORTABLE CHEST 1 VIEW COMPARISON:  08/11/2020 FINDINGS: Left AICD remains in place, unchanged. Cardiomegaly. Linear areas of scarring in the mid lungs bilaterally. No confluent opacities, effusions or edema. IMPRESSION: Cardiomegaly.  Chronic changes.  No active disease. Electronically Signed   By:  Charlett Nose M.D.   On: 12/14/2020 23:32    Assessment and Plan:   1.  Chronic mild thrombocytopenia 2.  Coronary artery disease, status post myocardial infarction in 2008, heart catheterization December 2021-95% proximal left circumflex 3.  Ventricular tachycardia, AICD discharge 01/04/2021 4.  Antiphospholipid syndrome, history of a CVA, 2 miscarriages -maintained on chronic Coumadin anticoagulation 5.  Ischemic cardiomyopathy 6.  Peripheral arterial disease 7.  Tobacco use 8.  Brain aneurysm  Ms. Wentzell is admitted following discharge of her AICD secondary to ventricular tachycardia.  She has ischemic cardiomyopathy and a left circumflex stenosis.  She is scheduled to undergo a PCI tomorrow.  Ms. Aschoff has a history of chronic intermittent thrombocytopenia.  There is no obvious explanation for the thrombocytopenia.  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  I doubt the current thrombocytopenia is related to the heparin and Lovenox she received last month.  The platelet count was higher on hospital admission 01/05/2020 compared to when she was discharged on Lovenox 12/19/2020.  Review of her current medical list does not include medications typically associated with thrombocytopenia.  There is no evidence of chronic liver disease or a chronic infection.  I have a low clinical suspicion for a hematopoietic malignancy.  I think it is reasonable to proceed with a cardiac catheterization/intervention with the platelet count at this level.  We can consider empiric treatment for ITP if the platelet count falls in the future.  She should  continue Coumadin anticoagulation if she has a history of antiphospholipid syndrome.  Cardiology can decide on continuing aspirin and Plavix.  We will attempt to locate records regarding the diagnosis of antiphospholipid syndrome (she reports this was diagnosed at [facility] and I cannot find records in Care Everywhere)  Recommendations: 1.  Proceed with PCI as recommended by cardiology 2.  Continue Coumadin anticoagulation at discharge 3.  Aspirin/Plavix as indicated following the PCI, limit exposure to dual platelet inhibition as possible 4.  Empiric trial of IVIG or prednisone if the platelet count falls to less than 50,000 5.  Outpatient follow-up will be scheduled in the hematology clinic at [facility] or in [place].  We will attempt to locate records regarding the diagnosis of antiphospholipid syndrome  Thornton Papas, MD 01/07/2021, 3:05 PM

## 2021-01-07 NOTE — Progress Notes (Signed)
Patient ID: [name] [name], female   DOB: 30-Jan-1961, 60 y.o.   MRN: [id number]     Advanced Heart Failure Rounding Note  PCP-Cardiologist: Nona Dell, MD   Subjective:    No complaints this morning, no further VT.   INR 1.7.  BP stable.  Platelets have dropped to 112.    Objective:   Weight Range: 92.5 kg Body mass index is 33.43 kg/m.   Vital Signs:   Temp:  [97.7 F (36.5 C)-98.4 F (36.9 C)] 97.9 F (36.6 C) (01/09 0451) Pulse Rate:  [59-69] 67 (01/09 0837) Resp:  [15-16] 16 (01/09 0451) BP: (91-127)/(46-78) 104/61 (01/09 0849) SpO2:  [96 %-99 %] 99 % (01/09 0451)    Weight change: Filed Weights   01/04/21 2002  Weight: 92.5 kg    Intake/Output:   Intake/Output Summary (Last 24 hours) at 01/07/2021 1018 Last data filed at 01/07/2021 0452 Gross per 24 hour  Intake 240 ml  Output --  Net 240 ml      Physical Exam    General: NAD Neck: No JVD, no thyromegaly or thyroid nodule.  Lungs: Clear to auscultation bilaterally with normal respiratory effort. CV: Nondisplaced PMI.  Heart regular S1/S2, no S3/S4, no murmur.  No peripheral edema.   Abdomen: Soft, nontender, no hepatosplenomegaly, no distention.  Skin: Intact without lesions or rashes.  Neurologic: Alert and oriented x 3.  Psych: Normal affect. Extremities: No clubbing or cyanosis.  HEENT: Normal.    Telemetry   NSR 60s (personally reviewed)  Labs    CBC Recent Labs    01/05/21 0308 01/07/21 0403  WBC 10.1 7.3  HGB 13.3 12.5  HCT 42.1 37.9  MCV 95.2 92.0  PLT 182 112*   Basic Metabolic Panel Recent Labs    02/54/27 0220 01/07/21 0403  NA 136 132*  K 4.2 3.8  CL 96* 92*  CO2 31 30  GLUCOSE 94 96  BUN 14 11  CREATININE 0.89 0.96  CALCIUM 8.5* 8.7*  MG 2.2 2.1   Liver Function Tests No results for input(s): AST, ALT, ALKPHOS, BILITOT, PROT, ALBUMIN in the last 72 hours. No results for input(s): LIPASE, AMYLASE in the last 72 hours. Cardiac Enzymes No results for  input(s): CKTOTAL, CKMB, CKMBINDEX, TROPONINI in the last 72 hours.  BNP: BNP (last 3 results) Recent Labs    08/11/20 0230 12/14/20 1921  BNP 571.0* 209.0*    ProBNP (last 3 results) No results for input(s): PROBNP in the last 8760 hours.   D-Dimer No results for input(s): DDIMER in the last 72 hours. Hemoglobin A1C No results for input(s): HGBA1C in the last 72 hours. Fasting Lipid Panel No results for input(s): CHOL, HDL, LDLCALC, TRIG, CHOLHDL, LDLDIRECT in the last 72 hours. Thyroid Function Tests No results for input(s): TSH, T4TOTAL, T3FREE, THYROIDAB in the last 72 hours.  Invalid input(s): FREET3  Other results:   Imaging    No results found.   Medications:     Scheduled Medications: . amiodarone  400 mg Oral BID  . aspirin EC  81 mg Oral Daily  . carvedilol  6.25 mg Oral BID WC  . dapagliflozin propanediol  10 mg Oral Daily  . digoxin  0.0625 mg Oral Daily  . DULoxetine  30 mg Oral Daily  . DULoxetine  60 mg Oral QPM  . ezetimibe  10 mg Oral Daily  . famotidine  20 mg Oral Daily  . gabapentin  300 mg Oral Daily  . gabapentin  600 mg  Oral QHS  . mexiletine  150 mg Oral Q8H  . potassium chloride SA  20 mEq Oral Daily  . sacubitril-valsartan  1 tablet Oral BID  . [START ON 01/08/2021] sodium chloride flush  3 mL Intravenous Q12H  . spironolactone  25 mg Oral Daily  . torsemide  80 mg Oral Daily    Infusions: . bivalirudin (ANGIOMAX) infusion 5 mg/mL (Cath Lab,ACS,PCI indication)      PRN Medications: acetaminophen, LORazepam, nitroGLYCERIN, ondansetron (ZOFRAN) IV, oxyCODONE-acetaminophen **AND** oxyCODONE, zolpidem   Assessment/Plan   1. CAD: MI in 2008 in [facility], cath at the time showed totally occluded diagonal, totally occluded LCX, and diffuse LAD disease. No intervention. LHC was done in 12/21 due to VT, this showed chronically occluded D1 and 95% proximal LCx. No intervention as she did not have chest pain and platelets were low. She is  now back with VT, ?scar mediated.  Troponin negative. Platelets normal at admission but have dropped again.  - Continue ASA 81 daily.  - Continue Zetia, has not tolerated statins. Good lipids in 12/21.  - Given recurrent VT, would recommend LCx intervention this admission.  INR 1.7, holding warfarin for PCI and covering with bivalirudin given fall in platelets (APLAS).  Plan for PCI to LCx on Monday morning if platelets stabilize.  2. Atrial fibrillation:TEE-guided DCCV to NSR in 12/21. She is in NSR today.  -If atrial fibrillation recurs, will need to consider ablation. - She is on amiodarone.  3. Carotid stenosis: Known RICA occlusion.Dopplers in 12/21 with <50% LICA. -Repeat dopplers in 12/22. 4. 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  - She is on ASA 81 and warfarin => holding warfarin for PCI, covering with bivalirudin rather than heparin gtt now that INR < 2.  5. Chronic systolic CHF: Ischemic cardiomyopathy. [employer] ICD. Echo (8/21)with EF 15-20%, anteroseptal AK, RV normal, severe LAE, mild MR. TEE (12/21) with EF 25-30%. RHC in 12/21 with preserved cardiac output and optimized filling pressures. NYHA class II symptoms, not volume overloaded on exam.BP stable.   -Continue Entresto 24/26 bid. -ContinueFarxiga 10 mg daily.  -Continue spironolactone 25 mg daily. - Continue Coreg6.25mg  bid.  - Continue torsemide 80 mg daily.  - Continue digoxin 0.125 daily, digoxin level 0.9.  6. Smoking: Active smoker, wants to stop on her own.  7. PAD: She denies claudication.  - Needs to quit smoking. 8. Thrombocytopenia: Dropped to 70K during last hospitalization with uncertain etiology.  Increased back to normal range after discharge. Plts > 200K at admission, now dropped to 112K.  She has history of APLAS.  She has not had heparin products so not HIT.  Only new med is mexiletine; she was not on this, however, when plts fell last admission.   Post-PCI, she will need ASA 81 x 1 month and Plavix at least 6 months.  Could stop ASA earlier if needed.  - I will ask hematology to see her, probably need to look at smear (?platelets clumping).   9. Mitral regurgitation: Moderate on 12/21 TEE, suspect functional.  10. VT: Suspect scar-mediated in 12/21 with ICD discharge and syncope.  Again with VT 01/03/21 with ICD discharge, did not pass out.  Troponin not elevated.  - No driving x 6 months.  - Reloading amiodarone and adding mexiletine per EP.  - As above, plan to intervene on LCx.  11. PVCs: Frequent, multiple morphologies so probably not amenable to ablation.  - Antiarrhythmics as above.   Length of Stay: 1  Katie Moch  Shirlee Latch, MD  01/07/2021, 10:18 AM  Advanced Heart Failure Team Pager [phone] (M-F; 7a - 4p)  Please contact [facility] for night-coverage after hours (4p -7a ) and weekends on [email or web address]

## 2021-01-07 NOTE — Progress Notes (Signed)
ANTICOAGULATION CONSULT NOTE - Initial Consult  Pharmacy Consult for bivalirudin  Indication: chest pain/ACS and afib, prior to PCI  Allergies  Allergen Reactions  . Beef-Derived Products Anaphylaxis    From tick bite  . Metoclopramide Hcl Anaphylaxis    Non responsive  . Penicillins Anaphylaxis    Pt states she can take any cephalosporin (per pt. 01/01/19) Shock Has patient had a PCN reaction causing immediate rash, facial/tongue/throat swelling, SOB or lightheadedness with hypotension: Yes Has patient had a PCN reaction causing severe rash involving mucus membranes or skin necrosis: No Has patient had a PCN reaction that required hospitalization Yes Has patient had a PCN reaction occurring within the last 10 years: No If all of the above answers are "NO", then may proceed with Cephalosporin   . Pork-Derived Products Anaphylaxis    From tick bite. Has tolerated heparin  . Metoprolol Other (See Comments)    Syncope   . Reglan [Metoclopramide]   . Statins Other (See Comments)    Myalgias    Patient Measurements: Height: 5' 5.5" (166.4 cm) Weight: 92.5 kg (204 lb) IBW/kg (Calculated) : 58.15  Vital Signs: Temp: 97.6 F (36.4 C) (01/09 1242) Temp Source: Oral (01/09 1242) BP: 101/57 (01/09 1242) Pulse Rate: 60 (01/09 1242)  Labs: Recent Labs    01/04/21 2015 01/04/21 2042 01/04/21 2205 01/05/21 0033 01/05/21 0308 01/06/21 0220 01/06/21 1423 01/07/21 0403 01/07/21 1207  HGB 13.3  --   --   --  13.3  --   --  12.5  --   HCT 40.5  --   --   --  42.1  --   --  37.9  --   PLT 214  --   --   --  182  --   --  112* 117*  APTT  --   --   --   --   --   --   --   --  39*  LABPROT  --    < >  --   --  31.1*  --  22.1* 19.2*  --   INR  --    < >  --   --  3.1*  --  2.0* 1.7*  --   CREATININE 0.85  --   --   --  0.87 0.89  --  0.96  --   TROPONINIHS 19*  --  17 16 17   --   --   --   --    < > = values in this interval not displayed.    Estimated Creatinine Clearance:  73.4 mL/min (by C-G formula based on SCr of 0.96 mg/dL).  Assessment: 59yof admitted after ICD shock for VT. Hx HF, Afib s/i DCCV >SR 12/21, and CAD.Pt takes warfarin outpatient for anticoagulation which has been held (outpatient warfarin regimen is 5mg  Mon, Wed, Fri, Sat and 10mg  Tues, Thurs, Sun). Pt has PMH of thrombocytopenia and platelets have decreased from 214 on admission on 1/6 to 112 this morning. Therefore pharmacy is now consulted to start bivalirudin infusion prior to PCI which is planned for Monday 01/08/21.  APTT 39 (subtherapeutic) but drawn at 12:07, which is 1hr after infusion start at 11:15  Goal of Therapy:  aPTT 50-85 seconds Monitor platelets by anticoagulation protocol: Yes   Plan:  Continue bivalirudin at current rate 23.15mg /hr  APTT timed for 1500 (4hr after infusion start) Monitor s/sx bleeding, CBC, aPTT    Fri, PharmD PGY1 Pharmacy Resident 01/07/2021 1:57 PM

## 2021-01-07 NOTE — Progress Notes (Addendum)
ANTICOAGULATION CONSULT NOTE  Pharmacy Consult for bivalirudin  Indication: chest pain/ACS and afib, prior to PCI  Allergies  Allergen Reactions  . Beef-Derived Products Anaphylaxis    From tick bite  . Metoclopramide Hcl Anaphylaxis    Non responsive  . Penicillins Anaphylaxis    Pt states she can take any cephalosporin (per pt. 01/01/19) Shock Has patient had a PCN reaction causing immediate rash, facial/tongue/throat swelling, SOB or lightheadedness with hypotension: Yes Has patient had a PCN reaction causing severe rash involving mucus membranes or skin necrosis: No Has patient had a PCN reaction that required hospitalization Yes Has patient had a PCN reaction occurring within the last 10 years: No If all of the above answers are "NO", then may proceed with Cephalosporin   . Pork-Derived Products Anaphylaxis    From tick bite. Has tolerated heparin  . Metoprolol Other (See Comments)    Syncope   . Reglan [Metoclopramide]   . Statins Other (See Comments)    Myalgias    Patient Measurements: Height: 5' 5.5" (166.4 cm) Weight: 92.5 kg (204 lb) IBW/kg (Calculated) : 58.15  Vital Signs: Temp: 97.6 F (36.4 C) (01/09 1242) Temp Source: Oral (01/09 1242) BP: 101/57 (01/09 1242) Pulse Rate: 60 (01/09 1242)  Labs: Recent Labs    01/04/21 2015 01/04/21 2042 01/04/21 2205 01/05/21 0033 01/05/21 0308 01/06/21 0220 01/06/21 1423 01/07/21 0403 01/07/21 1207 01/07/21 1452  HGB 13.3  --   --   --  13.3  --   --  12.5  --   --   HCT 40.5  --   --   --  42.1  --   --  37.9  --   --   PLT 214  --   --   --  182  --   --  112* 117*  --   APTT  --   --   --   --   --   --   --   --  39* 51*  LABPROT  --    < >  --   --  31.1*  --  22.1* 19.2*  --   --   INR  --    < >  --   --  3.1*  --  2.0* 1.7*  --   --   CREATININE 0.85  --   --   --  0.87 0.89  --  0.96  --   --   TROPONINIHS 19*  --  17 16 17   --   --   --   --   --    < > = values in this interval not displayed.     Estimated Creatinine Clearance: 73.4 mL/min (by C-G formula based on SCr of 0.96 mg/dL).  Assessment: 59yof admitted after ICD shock for VT. Hx HF, Afib s/i DCCV >SR 12/21, and CAD.Pt takes warfarin outpatient for anticoagulation which has been held (outpatient warfarin regimen is 5mg  Mon, Wed, Fri, Sat and 10mg  Tues, Thurs, Sun). Pt has PMH of thrombocytopenia and platelets have decreased from 214 on admission on 1/6 to 112 this morning. Therefore pharmacy is now consulted to start bivalirudin infusion prior to PCI which is planned for Monday 01/08/21.  APTT 51s (therapeutic) drawn this afternoon and now appears to be appropriate timing. No bleeding issues noted.   Goal of Therapy:  aPTT 50-85 seconds Monitor platelets by anticoagulation protocol: Yes   Plan:  Continue bivalirudin at current rate 0.25mg /kg/hr Confirmatory APTT tonight  Monitor s/sx bleeding, CBC, aPTT   Sheppard Coil PharmD., BCPS Clinical Pharmacist 01/07/2021 4:26 PM  Addendum: Confirmatory aptt is elevated at 113s. No issues with IV noted, no bleeding per nursing. Will hold infusion 30 minutes and reduce rate to 0.18mg /kg/hr. Recheck in am  Sheppard Coil PharmD., BCPS Clinical Pharmacist 01/07/2021 9:50 PM

## 2021-01-08 ENCOUNTER — Encounter (HOSPITAL_COMMUNITY)
Admission: EM | Disposition: A | Discharge status: Home / Self Care | Payer: Self-pay | Source: Home / Self Care | Attending: Cardiology

## 2021-01-08 DIAGNOSIS — I2510 Atherosclerotic heart disease of native coronary artery without angina pectoris: Secondary | ICD-10-CM | POA: Diagnosis not present

## 2021-01-08 DIAGNOSIS — I255 Ischemic cardiomyopathy: Secondary | ICD-10-CM | POA: Diagnosis not present

## 2021-01-08 DIAGNOSIS — I472 Ventricular tachycardia: Secondary | ICD-10-CM | POA: Diagnosis not present

## 2021-01-08 DIAGNOSIS — D693 Immune thrombocytopenic purpura: Secondary | ICD-10-CM | POA: Diagnosis not present

## 2021-01-08 DIAGNOSIS — D696 Thrombocytopenia, unspecified: Secondary | ICD-10-CM | POA: Diagnosis not present

## 2021-01-08 HISTORY — PX: CORONARY STENT INTERVENTION: CATH118234

## 2021-01-08 LAB — BASIC METABOLIC PANEL
Anion gap: 10 (ref 5–15)
BUN: 16 mg/dL (ref 6–20)
CO2: 29 mmol/L (ref 22–32)
Calcium: 8.8 mg/dL — ABNORMAL LOW (ref 8.9–10.3)
Chloride: 97 mmol/L — ABNORMAL LOW (ref 98–111)
Creatinine, Ser: 0.98 mg/dL (ref 0.44–1.00)
GFR, Estimated: >60 mL/min (ref >60)
Glucose, Bld: 94 mg/dL (ref 70–99)
Potassium: 3.8 mmol/L (ref 3.5–5.1)
Sodium: 136 mmol/L (ref 135–145)

## 2021-01-08 LAB — MAGNESIUM: Magnesium: 2.2 mg/dL (ref 1.7–2.4)

## 2021-01-08 LAB — CBC
HCT: 38.6 % (ref 36.0–46.0)
Hemoglobin: 12.9 g/dL (ref 12.0–15.0)
MCH: 30.7 pg (ref 26.0–34.0)
MCHC: 33.4 g/dL (ref 30.0–36.0)
MCV: 91.9 fL (ref 80.0–100.0)
Platelets: 90 10*3/uL — ABNORMAL LOW (ref 150–400)
RBC: 4.2 MIL/uL (ref 3.87–5.11)
RDW: 13.4 % (ref 11.5–15.5)
WBC: 8.5 10*3/uL (ref 4.0–10.5)
nRBC: 0 % (ref 0.0–0.2)

## 2021-01-08 LAB — APTT: aPTT: 88 seconds — ABNORMAL HIGH (ref 24–36)

## 2021-01-08 LAB — PROTIME-INR
INR: 2.2 — ABNORMAL HIGH (ref 0.8–1.2)
Prothrombin Time: 23.5 seconds — ABNORMAL HIGH (ref 11.4–15.2)

## 2021-01-08 LAB — POCT ACTIVATED CLOTTING TIME: Activated Clotting Time: 624 seconds

## 2021-01-08 SURGERY — CORONARY STENT INTERVENTION
Anesthesia: LOCAL

## 2021-01-08 MED ORDER — FENTANYL CITRATE (PF) 100 MCG/2ML IJ SOLN
INTRAMUSCULAR | Status: AC
  Filled 2021-01-08: qty 2

## 2021-01-08 MED ORDER — MIDAZOLAM HCL 2 MG/2ML IJ SOLN
INTRAMUSCULAR | Status: DC | PRN
  Administered 2021-01-08 (×2): 1 mg via INTRAVENOUS

## 2021-01-08 MED ORDER — HEPARIN SODIUM (PORCINE) 1000 UNIT/ML IJ SOLN
INTRAMUSCULAR | Status: AC
  Filled 2021-01-08: qty 1

## 2021-01-08 MED ORDER — LIDOCAINE HCL (PF) 1 % IJ SOLN
INTRAMUSCULAR | Status: AC
  Filled 2021-01-08: qty 30

## 2021-01-08 MED ORDER — ASPIRIN 81 MG PO CHEW
81.0000 mg | CHEWABLE_TABLET | Freq: Every day | ORAL | Status: DC
  Administered 2021-01-08: 81 mg via ORAL

## 2021-01-08 MED ORDER — SODIUM CHLORIDE 0.9% FLUSH: 3.0000 mL | INTRAVENOUS | Status: DC | PRN

## 2021-01-08 MED ORDER — ASPIRIN 81 MG PO CHEW
CHEWABLE_TABLET | ORAL | Status: AC
  Filled 2021-01-08: qty 1

## 2021-01-08 MED ORDER — WARFARIN SODIUM 5 MG PO TABS
10.0000 mg | ORAL_TABLET | Freq: Once | ORAL | Status: AC
  Administered 2021-01-08: 10 mg via ORAL
  Filled 2021-01-08: qty 1
  Filled 2021-01-08: qty 2

## 2021-01-08 MED ORDER — CLOPIDOGREL BISULFATE 75 MG PO TABS
75.0000 mg | ORAL_TABLET | Freq: Every day | ORAL | Status: DC
  Administered 2021-01-09: 75 mg via ORAL
  Filled 2021-01-08: qty 1

## 2021-01-08 MED ORDER — MIDAZOLAM HCL 2 MG/2ML IJ SOLN
INTRAMUSCULAR | Status: AC
  Filled 2021-01-08: qty 2

## 2021-01-08 MED ORDER — BIVALIRUDIN BOLUS VIA INFUSION - CUPID
INTRAVENOUS | Status: DC | PRN
  Administered 2021-01-08: 69.675 mg via INTRAVENOUS

## 2021-01-08 MED ORDER — FENTANYL CITRATE (PF) 100 MCG/2ML IJ SOLN
INTRAMUSCULAR | Status: DC | PRN
  Administered 2021-01-08 (×2): 25 ug via INTRAVENOUS

## 2021-01-08 MED ORDER — SODIUM CHLORIDE 0.9 % IV SOLN: 250.0000 mL | INTRAVENOUS | Status: DC | PRN

## 2021-01-08 MED ORDER — HEPARIN (PORCINE) IN NACL 1000-0.9 UT/500ML-% IV SOLN
INTRAVENOUS | Status: DC | PRN
  Administered 2021-01-08 (×2): 500 mL

## 2021-01-08 MED ORDER — HEPARIN (PORCINE) IN NACL 1000-0.9 UT/500ML-% IV SOLN
INTRAVENOUS | Status: AC
  Filled 2021-01-08: qty 1000

## 2021-01-08 MED ORDER — SODIUM CHLORIDE 0.9% FLUSH
3.0000 mL | Freq: Two times a day (BID) | INTRAVENOUS | Status: DC
  Administered 2021-01-08 – 2021-01-09 (×2): 3 mL via INTRAVENOUS

## 2021-01-08 MED ORDER — VERAPAMIL HCL 2.5 MG/ML IV SOLN
INTRAVENOUS | Status: AC
  Filled 2021-01-08: qty 2

## 2021-01-08 MED ORDER — SODIUM CHLORIDE 0.9 % IV SOLN
0.1200 mg/kg/h | INTRAVENOUS | Status: DC
  Administered 2021-01-08: 0.15 mg/kg/h via INTRAVENOUS
  Filled 2021-01-08 (×2): qty 250

## 2021-01-08 MED ORDER — SODIUM CHLORIDE 0.9 % IV SOLN
0.1500 mg/kg/h | INTRAVENOUS | Status: DC
  Administered 2021-01-08: 0.15 mg/kg/h via INTRAVENOUS
  Filled 2021-01-08: qty 250

## 2021-01-08 MED ORDER — SODIUM CHLORIDE 0.9 % IV SOLN
INTRAVENOUS | Status: AC | PRN
  Administered 2021-01-08: 10 mL/h via INTRAVENOUS

## 2021-01-08 MED ORDER — LIDOCAINE HCL (PF) 1 % IJ SOLN
INTRAMUSCULAR | Status: DC | PRN
  Administered 2021-01-08: 2 mL

## 2021-01-08 MED ORDER — NITROGLYCERIN 1 MG/10 ML FOR IR/CATH LAB
INTRA_ARTERIAL | Status: AC
  Filled 2021-01-08: qty 10

## 2021-01-08 MED ORDER — IOHEXOL 350 MG/ML SOLN
INTRAVENOUS | Status: DC | PRN
  Administered 2021-01-08: 50 mL via INTRA_ARTERIAL

## 2021-01-08 MED ORDER — SODIUM CHLORIDE 0.9 % IV SOLN
INTRAVENOUS | Status: DC | PRN
  Administered 2021-01-08: 1.75 mg/kg/h via INTRAVENOUS

## 2021-01-08 MED ORDER — VERAPAMIL HCL 2.5 MG/ML IV SOLN
INTRAVENOUS | Status: DC | PRN
  Administered 2021-01-08: 10 mL via INTRA_ARTERIAL

## 2021-01-08 MED ORDER — SODIUM CHLORIDE 0.9 % WEIGHT BASED INFUSION
1.0000 mL/kg/h | INTRAVENOUS | Status: AC
  Administered 2021-01-08: 1 mL/kg/h via INTRAVENOUS

## 2021-01-08 MED ORDER — WARFARIN - PHARMACIST DOSING INPATIENT: Freq: Every day | Status: DC

## 2021-01-08 MED ORDER — CLOPIDOGREL BISULFATE 300 MG PO TABS
600.0000 mg | ORAL_TABLET | Freq: Once | ORAL | Status: AC
  Administered 2021-01-08: 600 mg via ORAL
  Filled 2021-01-08: qty 2

## 2021-01-08 SURGICAL SUPPLY — 19 items
BAG SNAP BAND KOVER 36X36 (MISCELLANEOUS) ×1 IMPLANT
BALLN SAPPHIRE 2.0X15 (BALLOONS) ×2
BALLN SAPPHIRE ~~LOC~~ 2.5X12 (BALLOONS) ×1 IMPLANT
BALLOON SAPPHIRE 2.0X15 (BALLOONS) IMPLANT
CATH LAUNCHER 6FR EBU3.5 (CATHETERS) ×1 IMPLANT
COVER DOME SNAP 22 D (MISCELLANEOUS) ×1 IMPLANT
DEVICE RAD COMP TR BAND LRG (VASCULAR PRODUCTS) ×1 IMPLANT
ELECT DEFIB PAD ADLT CADENCE (PAD) ×1 IMPLANT
GLIDESHEATH SLEND SS 6F .021 (SHEATH) ×1 IMPLANT
GUIDEWIRE INQWIRE 1.5J.035X260 (WIRE) IMPLANT
INQWIRE 1.5J .035X260CM (WIRE) ×2
KIT ENCORE 26 ADVANTAGE (KITS) ×1 IMPLANT
KIT HEART LEFT (KITS) ×2 IMPLANT
PACK CARDIAC CATHETERIZATION (CUSTOM PROCEDURE TRAY) ×2 IMPLANT
SHEATH PROBE COVER 6X72 (BAG) ×1 IMPLANT
STENT RESOLUTE ONYX 2.25X22 (Permanent Stent) ×1 IMPLANT
TRANSDUCER W/STOPCOCK (MISCELLANEOUS) ×2 IMPLANT
TUBING CIL FLEX 10 FLL-RA (TUBING) ×2 IMPLANT
WIRE ASAHI PROWATER 180CM (WIRE) ×1 IMPLANT

## 2021-01-08 NOTE — Progress Notes (Signed)
ANTICOAGULATION CONSULT NOTE  Pharmacy Consult for bivalirudin  Indication: chest pain/ACS and afib, prior to PCI  Allergies  Allergen Reactions  . Beef-Derived Products Anaphylaxis    From tick bite  . Metoclopramide Hcl Anaphylaxis    Non responsive  . Penicillins Anaphylaxis    Pt states she can take any cephalosporin (per pt. 01/01/19) Shock Has patient had a PCN reaction causing immediate rash, facial/tongue/throat swelling, SOB or lightheadedness with hypotension: Yes Has patient had a PCN reaction causing severe rash involving mucus membranes or skin necrosis: No Has patient had a PCN reaction that required hospitalization Yes Has patient had a PCN reaction occurring within the last 10 years: No If all of the above answers are "NO", then may proceed with Cephalosporin   . Pork-Derived Products Anaphylaxis    From tick bite. Has tolerated heparin  . Metoprolol Other (See Comments)    Syncope   . Reglan [Metoclopramide]   . Statins Other (See Comments)    Myalgias    Patient Measurements: Height: 5' 5.5" (166.4 cm) Weight: 92.9 kg (204 lb 14.4 oz) IBW/kg (Calculated) : 58.15  Vital Signs: Temp: 97.7 F (36.5 C) (01/10 0429) Temp Source: Oral (01/10 0429) BP: 104/54 (01/10 1440) Pulse Rate: 54 (01/10 1440)  Labs: Recent Labs    01/06/21 0220 01/06/21 1423 01/07/21 0403 01/07/21 1207 01/07/21 1207 01/07/21 1452 01/07/21 2013 01/08/21 0458  HGB  --   --  12.5  --   --   --   --  12.9  HCT  --   --  37.9  --   --   --   --  38.6  PLT  --   --  112* 117*  --   --   --  90*  APTT  --   --   --  39*   < > 51* 113* 88*  LABPROT  --  22.1* 19.2*  --   --   --   --  23.5*  INR  --  2.0* 1.7*  --   --   --   --  2.2*  CREATININE 0.89  --  0.96  --   --   --   --  0.98   < > = values in this interval not displayed.    Estimated Creatinine Clearance: 72.1 mL/min (by C-G formula based on SCr of 0.98 mg/dL).  Assessment: 59yof admitted after ICD shock for VT. Hx  HF, Afib s/i DCCV >SR 12/21, and CAD.Pt takes warfarin outpatient for anticoagulation which has been held (outpatient warfarin regimen is 5mg  Mon, Wed, Fri, Sat and 10mg  Tues, Thurs, Sun). Pt has PMH of thrombocytopenia and platelets have decreased from 214 on admission on 1/6 to 112 this morning. Therefore pharmacy is now consulted to start bivalirudin infusion prior to PCI which is planned for Monday 01/08/21.  APTT 88 this morning, just slightly above goal, rate adjusted to 0.15 mg/kg/hr.  Then turned off for cath lab.  Pharmacy asked to resume IV bivalirudin 8 hrs after sheath out (sheath removed at 1240 pm).  Also asked to resume Coumadin.  PTA Coumadin dose 10 mg TuThSu and 5 mg on MWFSa.  INR will be falsely elevated from bivalirudin infusion - will target INR = 3 (hopefully will roughly equate to "real" INR of ~ 2) before stopping bivalirudin.  Goal of Therapy:  aPTT 50-85 seconds Monitor platelets by anticoagulation protocol: Yes   Plan:  Restart bivalirudin at 0.15 mg/kg/hr at 2045 PM tonight.  Check aPTT 4 hrs after gtt resumed. Coumadin 10 mg x 1 tonight. Daily aPTT, INR.  Reece Leader, Colon Flattery, BCCP Clinical Pharmacist  01/08/2021 3:11 PM   [facility] pharmacy phone numbers are listed on [email or web address]

## 2021-01-08 NOTE — Progress Notes (Addendum)
Patient ID: [name] [name], female   DOB: 13-Jan-1961, 60 y.o.   MRN: [id number]     Advanced Heart Failure Rounding Note  PCP-Cardiologist: Nona Dell, MD   Subjective:    No further VT. Denies CP.   Plan for LCx PCI today.   Resting comfortably in bed. No complaints.    Objective:   Weight Range: 92.9 kg Body mass index is 33.58 kg/m.   Vital Signs:   Temp:  [97.6 F (36.4 C)-97.8 F (36.6 C)] 97.7 F (36.5 C) (01/10 0429) Pulse Rate:  [60-70] 68 (01/10 0429) Resp:  [15-18] 16 (01/10 0429) BP: (101-124)/(57-74) 124/58 (01/10 0429) SpO2:  [90 %-97 %] 90 % (01/10 0429) Weight:  [92.9 kg] 92.9 kg (01/10 0429)    Weight change: Filed Weights   01/04/21 2002 01/08/21 0429  Weight: 92.5 kg 92.9 kg    Intake/Output:   Intake/Output Summary (Last 24 hours) at 01/08/2021 1012 Last data filed at 01/08/2021 0000 Gross per 24 hour  Intake 980 ml  Output --  Net 980 ml      Physical Exam    PHYSICAL EXAM: General:  Well appearing, moderately obese. No respiratory difficulty HEENT: normal Neck: supple. no JVD. Carotids 2+ bilat; no bruits. No lymphadenopathy or thyromegaly appreciated. Cor: PMI nondisplaced. Regular rate & rhythm. No rubs, gallops or murmurs. Lungs: clear Abdomen: soft, nontender, nondistended. No hepatosplenomegaly. No bruits or masses. Good bowel sounds. Extremities: no cyanosis, clubbing, rash, edema Neuro: alert & oriented x 3, cranial nerves grossly intact. moves all 4 extremities w/o difficulty. Affect pleasant.    Telemetry   NSR 60s. No VT  (personally reviewed)  Labs    CBC Recent Labs    01/07/21 0403 01/07/21 1207 01/08/21 0458  WBC 7.3  --  8.5  HGB 12.5  --  12.9  HCT 37.9  --  38.6  MCV 92.0  --  91.9  PLT 112* 117* 90*   Basic Metabolic Panel Recent Labs    40/34/74 0403 01/08/21 0458  NA 132* 136  K 3.8 3.8  CL 92* 97*  CO2 30 29  GLUCOSE 96 94  BUN 11 16  CREATININE 0.96 0.98  CALCIUM 8.7* 8.8*  MG  2.1 2.2   Liver Function Tests No results for input(s): AST, ALT, ALKPHOS, BILITOT, PROT, ALBUMIN in the last 72 hours. No results for input(s): LIPASE, AMYLASE in the last 72 hours. Cardiac Enzymes No results for input(s): CKTOTAL, CKMB, CKMBINDEX, TROPONINI in the last 72 hours.  BNP: BNP (last 3 results) Recent Labs    08/11/20 0230 12/14/20 1921  BNP 571.0* 209.0*    ProBNP (last 3 results) No results for input(s): PROBNP in the last 8760 hours.   D-Dimer No results for input(s): DDIMER in the last 72 hours. Hemoglobin A1C No results for input(s): HGBA1C in the last 72 hours. Fasting Lipid Panel No results for input(s): CHOL, HDL, LDLCALC, TRIG, CHOLHDL, LDLDIRECT in the last 72 hours. Thyroid Function Tests No results for input(s): TSH, T4TOTAL, T3FREE, THYROIDAB in the last 72 hours.  Invalid input(s): FREET3  Other results:   Imaging    No results found.   Medications:     Scheduled Medications: . amiodarone  400 mg Oral BID  . aspirin EC  81 mg Oral Daily  . carvedilol  6.25 mg Oral BID WC  . dapagliflozin propanediol  10 mg Oral Daily  . digoxin  0.0625 mg Oral Daily  . DULoxetine  30 mg Oral Daily  .  DULoxetine  60 mg Oral QPM  . ezetimibe  10 mg Oral Daily  . famotidine  20 mg Oral Daily  . gabapentin  300 mg Oral Daily  . gabapentin  600 mg Oral QHS  . mexiletine  150 mg Oral Q8H  . potassium chloride SA  20 mEq Oral Daily  . sacubitril-valsartan  1 tablet Oral BID  . sodium chloride flush  3 mL Intravenous Q12H  . spironolactone  25 mg Oral Daily  . torsemide  80 mg Oral Daily    Infusions: . sodium chloride    . sodium chloride 10 mL/hr at 01/08/21 9242  . bivalirudin (ANGIOMAX) infusion 5 mg/mL (Cath Lab,ACS,PCI indication) 0.15 mg/kg/hr (01/08/21 6834)    PRN Medications: sodium chloride, acetaminophen, LORazepam, nitroGLYCERIN, ondansetron (ZOFRAN) IV, oxyCODONE-acetaminophen **AND** oxyCODONE, sodium chloride flush,  zolpidem   Assessment/Plan   1. CAD: MI in 2008 in [facility], cath at the time showed totally occluded diagonal, totally occluded LCX, and diffuse LAD disease. No intervention. LHC was done in 12/21 due to VT, this showed chronically occluded D1 and 95% proximal LCx. No intervention as she did not have chest pain and platelets were low. She is now back with VT, ?scar mediated.  Troponin negative. Platelets normal at admission but have dropped again, 90K today.  - Continue ASA 81 daily.  - Continue Zetia, has not tolerated statins. Good lipids in 12/21.  - Given recurrent VT, plan for LCx intervention today. 2. Atrial fibrillation:TEE-guided DCCV to NSR in 12/21. She is in NSR today.  -If atrial fibrillation recurs, will need to consider ablation. - She is on amiodarone.  3. Carotid stenosis: Known RICA occlusion.Dopplers in 12/21 with <50% LICA. -Repeat dopplers in 12/22. 4. 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  - She is on ASA 81 and warfarin => holding warfarin for PCI. INR 1.7 yesterday, bivalirudin gtt started for coverage.  5. Chronic systolic CHF: Ischemic cardiomyopathy. [employer] ICD. Echo (8/21)with EF 15-20%, anteroseptal AK, RV normal, severe LAE, mild MR. TEE (12/21) with EF 25-30%. RHC in 12/21 with preserved cardiac output and optimized filling pressures. NYHA class II symptoms, not volume overloaded on exam.BP stable.   -Continue Entresto 24/26 bid. -ContinueFarxiga 10 mg daily.  -Continue spironolactone 25 mg daily. - Continue Coreg6.25mg  bid.  - Continue torsemide 80 mg daily.  - Continue digoxin 0.125 daily, digoxin level 0.9.  6. Smoking: Active smoker, wants to stop on her own.  7. PAD: She denies claudication.  - Needs to quit smoking. 8. Thrombocytopenia: Dropped to 70K during last hospitalization with uncertain etiology.  Increased back to normal range after discharge. Plts > 200K at admission, now dropped to 90K.   She has history of APLAS.  She has not had heparin products so not HIT.  Only new med is mexiletine; she was not on this, however, when plts fell last admission.  Hematology has seen this admit. 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  - Per hematology, ok to proceed with a cardiac catheterization/intervention with the platelet count at this level.  - Can consider empiric trial of IVIG or prednisone if the platelet count falls to less than 50,000.  - Continue coumadin post PCI  - Post-PCI, she will need ASA 81 x 1 month and Plavix at least 6 months.  Could stop ASA earlier if needed.  - Appreciate hematology's assistance  9. Mitral regurgitation: Moderate on 12/21 TEE, suspect functional.  10. VT: Suspect  scar-mediated in 12/21 with ICD discharge and syncope.  Again with VT 01/03/21 with ICD discharge, did not pass out.  Troponin not elevated.  - No driving x 6 months.  - Reloading amiodarone and adding mexiletine per EP.  - As above, plan to intervene on LCx.  11. PVCs: Frequent, multiple morphologies so probably not amenable to ablation.  - Antiarrhythmics as above.   Length of Stay: 2  Robbie Lis, PA-C  01/08/2021, 10:12 AM  Advanced Heart Failure Team Pager [phone] (M-F; 7a - 4p)  Please contact [facility] for night-coverage after hours (4p -7a ) and weekends on [email or web address]  Patient seen with PA, agree with the above.    Plts 90K today.  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  Going for PCI LCx today.  Post-PCI, will be on Plavix and warfarin (hold ASA).  Given APLAS, will bridge back to therapeutic warfarin with bivalirudin (consider switch to Lovenox if platelets start to rise again).   [name] 01/08/2021 12:58 PM

## 2021-01-08 NOTE — Progress Notes (Signed)
ANTICOAGULATION CONSULT NOTE  Pharmacy Consult for bivalirudin  Indication: chest pain/ACS and afib, prior to PCI  Labs: Recent Labs    01/06/21 0220 01/06/21 1423 01/07/21 0403 01/07/21 1207 01/07/21 1207 01/07/21 1452 01/07/21 2013 01/08/21 0458  HGB  --   --  12.5  --   --   --   --   --   HCT  --   --  37.9  --   --   --   --   --   PLT  --   --  112* 117*  --   --   --   --   APTT  --   --   --  39*   < > 51* 113* 88*  LABPROT  --  22.1* 19.2*  --   --   --   --  23.5*  INR  --  2.0* 1.7*  --   --   --   --  2.2*  CREATININE 0.89  --  0.96  --   --   --   --  0.98   < > = values in this interval not displayed.    Assessment: 59yof admitted after ICD shock for VT. Hx HF, Afib s/i DCCV >SR 12/21, and CAD.Pt takes warfarin outpatient for anticoagulation which has been held (outpatient warfarin regimen is 5mg  Mon, Wed, Fri, Sat and 10mg  Tues, Thurs, Sun). Pt has PMH of thrombocytopenia and platelets have decreased from 214 on admission on 1/6 to 112 this morning. Therefore pharmacy is now consulted to start bivalirudin infusion prior to PCI which is planned for Monday 01/08/21. APTT this am is 88 sec    Goal of Therapy:  aPTT 50-85 seconds Monitor platelets by anticoagulation protocol: Yes   Plan:  Decrease bivalirudin to 0.15mg /kg/hr F/u after PCI Thanks for allowing pharmacy to be a part of this patient's care.  Monday, PharmD Clinical Pharmacist 01/08/2021 6:10 AM

## 2021-01-08 NOTE — Progress Notes (Addendum)
HEMATOLOGY-ONCOLOGY PROGRESS NOTE  SUBJECTIVE: The patient just returned from the Cath Lab.  Tolerated the procedure well.  Denies active bleeding.  PHYSICAL EXAMINATION:  Vitals:   01/07/21 2328 01/08/21 0429  BP: (!) 105/57 (!) 124/58  Pulse: 67 68  Resp: 18 16  Temp: 97.7 F (36.5 C) 97.7 F (36.5 C)  SpO2: 96% 90%   Filed Weights   01/04/21 2002 01/08/21 0429  Weight: 92.5 kg 92.9 kg    Intake/Output from previous day: 01/09 0701 - 01/10 0700 In: 1160 [P.O.:1140; I.V.:20] Out: -   GENERAL:alert, no distress and comfortable SKIN: No petechiae, has a few scattered ecchymoses on her arms and legs OROPHARYNX:no exudate, no erythema and lips, buccal mucosa, and tongue normal  LUNGS: clear to auscultation and percussion with normal breathing effort HEART: regular rate & rhythm and no murmurs and no lower extremity edema ABDOMEN:abdomen soft, non-tender and normal bowel sounds NEURO: alert & oriented x 3 with fluent speech, no focal motor/sensory deficits  LABORATORY DATA:  I have reviewed the data as listed CMP Latest Ref Rng & Units 01/08/2021 01/07/2021 01/06/2021  Glucose 70 - 99 mg/dL 94 96 94  BUN 6 - 20 mg/dL 16 11 14   Creatinine 0.44 - 1.00 mg/dL 2.77 8.24  Sodium 135 - 145 mmol/L 136 132(L) 136  Potassium 3.5 - 5.1 mmol/L 3.8 3.8 4.2  Chloride 98 - 111 mmol/L 97(L) 92(L) 96(L)  CO2 22 - 32 mmol/L 29 30 31   Calcium 8.9 - 10.3 mg/dL 2.35) ) 3.6(R)  Total Protein 6.5 - 8.1 g/dL - - -  Total Bilirubin 0.3 - 1.2 mg/dL - - -  Alkaline Phos 38 - 126 U/L - - -  AST 15 - 41 U/L - - -  ALT 0 - 44 U/L - - -    Lab Results  Component Value Date   WBC 8.5 01/08/2021   HGB 12.9 01/08/2021   HCT 38.6 01/08/2021   MCV 91.9 01/08/2021   PLT 90 (L) 01/08/2021   NEUTROABS 8.2 (H) 08/11/2020    CARDIAC CATHETERIZATION  Result Date: 12/18/2020  1st Diag lesion is 100% stenosed.  Mid LAD lesion is 45% stenosed.  Prox Cx to Mid Cx lesion is 95% stenosed.   Prox RCA lesion is 30% stenosed.  Findings: Ao = 125/65 (88) LV = 127/13 RA =  1 RV = 27/4 PA = 24/6 (14) PCW = 12 Fick cardiac output/index = 6.2/3.0 PVR = 0.3 WU Ao sat = 99% PA sat = 76%, 77% High SVC sat = 80% Assessment: 1. 2v CAD with 95% mid LCX and chronically occluded D1 2. EF 25% 3. Well compensated hemodynamics with high cardiac output. 4. No evidence of intracardiac shunting Plan/Discussion: Cath results very similar to cath report from 2008 though LCx is 95% stenosed (not 100% as previously reported). I reviewed cath with Dr. 12/20/2020. Lcx is amenable to PCI but with dropping platelets and absolute need for warfarin and ASA with anti-phospholipid syndrome and recent DC-CV will defer PCI for now particularly in light of normal hstrop. Can reconsider in January or February when farther out from [facility] if platelets rebounded. March, MD 10:35 AM   [facility] : [facility] Carotid Bilateral  Result Date: 12/15/2020 CLINICAL DATA:  Right carotid artery occlusion. EXAM: BILATERAL CAROTID DUPLEX ULTRASOUND TECHNIQUE: [place] scale imaging, color Doppler and duplex ultrasound were performed of bilateral carotid and vertebral arteries in the neck. COMPARISON:  10/07/2014 and CT a 07/28/2017 FINDINGS: Criteria: Quantification of carotid stenosis is  based on velocity parameters that correlate the residual internal carotid diameter with NASCET-based stenosis levels, using the diameter of the distal internal carotid lumen as the denominator for stenosis measurement. The following velocity measurements were obtained: RIGHT ICA: Occluded CCA: 67/7 cm/sec ECA: 290 cm/sec LEFT ICA: 128/25 cm/sec CCA: 93/19 cm/sec SYSTOLIC ICA/CCA RATIO:  0.4 ECA: 198 cm/sec RIGHT CAROTID ARTERY: Echogenic plaque at the right carotid bulb. External carotid artery is patent with normal waveform. Chronic occlusion of the proximal internal carotid artery. RIGHT VERTEBRAL ARTERY: Antegrade flow and normal waveform in the right vertebral artery. LEFT  CAROTID ARTERY: Echogenic plaque at the left carotid bulb. External carotid artery is patent with normal waveform. Small amount of plaque in the proximal internal carotid artery. Normal waveforms in the internal carotid artery. LEFT VERTEBRAL ARTERY: Antegrade flow and normal waveform in the left vertebral artery. IMPRESSION: 1. 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 2. Small amount of plaque in the left internal carotid artery. Peak systolic velocity in the left internal artery has slightly elevated, now measuring up to 128 cm/sec. Estimated degree of stenosis in the left internal carotid artery is less than 50% based on the carotid artery ratio but recommend continued surveillance. 3. Patent vertebral arteries with antegrade flow. 4. Bilateral external carotid arteries are patent. Again noted is an elevated peak systolic velocity in the right external carotid artery. Electronically Signed   By: Richarda Overlie M.D.   On: 12/15/2020 12:50   DG Chest Port 1 View  Result Date: 01/04/2021 CLINICAL DATA:  Pain EXAM: PORTABLE CHEST 1 VIEW COMPARISON:  12/14/2020 FINDINGS: There is a dual chamber left-sided pacemaker/ICD in place. There is stable scarring in the mid lung zones bilaterally. There is no pneumothorax. There is persistent cardiomegaly. No large pleural effusion. No acute osseous abnormality. IMPRESSION: No active disease. Electronically Signed   By: Katherine Mantle M.D.   On: 01/04/2021 21:13   DG Chest Portable 1 View  Result Date: 12/14/2020 CLINICAL DATA:  Syncope syncope EXAM: PORTABLE CHEST 1 VIEW COMPARISON:  08/11/2020 FINDINGS: Left AICD remains in place, unchanged. Cardiomegaly. Linear areas of scarring in the mid lungs bilaterally. No confluent opacities, effusions or edema. IMPRESSION: Cardiomegaly.  Chronic changes.  No active disease. Electronically Signed   By: Charlett Nose M.D.   On: 12/14/2020 23:32    ASSESSMENT AND PLAN: 1.  Chronic mild thrombocytopenia 2.   Coronary artery disease, status post myocardial infarction in 2008, heart catheterization December 2021-95% proximal left circumflex 3.  Ventricular tachycardia, AICD discharge 01/04/2021 4.  Antiphospholipid syndrome, history of a CVA, 2 miscarriages -maintained on chronic Coumadin anticoagulation 5.  Ischemic cardiomyopathy 6.  Peripheral arterial disease 7.  Tobacco use 8.  Brain aneurysm  [name] [name] appears stable.  Underwent PCI earlier today without any significant bleeding.  [name] [name] has a history of chronic intermittent thrombocytopenia.  There is no obvious explanation for the thrombocytopenia.  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  I doubt the current thrombocytopenia is related to the heparin and Lovenox she received last month.  The platelet count was higher on hospital admission 01/05/2020 compared to when she was discharged on Lovenox 12/19/2020.  Review of her current medical list does not include medications typically associated with thrombocytopenia.  There is no evidence of chronic liver disease or a chronic infection.  I have a low clinical suspicion for a hematopoietic malignancy.  We can consider empiric treatment for ITP  if the platelet count falls in the future. She should continue Coumadin anticoagulation if she has a history of antiphospholipid syndrome.  Aspirin and Plavix per cardiology.  Recommendations: 1.  Continue Coumadin anticoagulation at discharge, Coumadin management per the cardiology clinic 2.  Aspirin/Plavix as indicated following the PCI, limit exposure to dual platelet inhibition as possible 3.  Empiric trial of IVIG or prednisone if the platelet count falls to less than 50,000 4.  The patient prefers to have hematology follow-up as an outpatient in [place].  We will arrange for outpatient follow-up upon discharge.    LOS: 2 days   Clenton Pare, DNP, AGPCNP-BC, AOCNP 01/08/21 I saw  [name] [name] at approximately 6:45 AM.  She denies bleeding.  No complaint.  She is scheduled for a PCI today.  The platelet count is slightly lower today and similar to when she was in the hospital last month.  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  We will plan for an empiric trial of prednisone if the platelets fall to less than 50,000.  We will also attempt to obtain records regarding the diagnosis of antiphospholipid syndrome.  We will repeat lupus anticoagulant and anticardiolipin antibody testing as an outpatient.  Follow-up will be scheduled in the hematology clinic for within the next 1 month.

## 2021-01-08 NOTE — Interval H&P Note (Signed)
History and Physical Interval Note:  01/08/2021 11:22 AM  [name] [name]  has presented today for surgery, with the diagnosis of cad.  The various methods of treatment have been discussed with the patient and family. After consideration of risks, benefits and other options for treatment, the patient has consented to  Procedure(s): CORONARY STENT INTERVENTION (N/A) as a surgical intervention.  The patient's history has been reviewed, patient examined, no change in status, stable for surgery.  I have reviewed the patient's chart and labs.  Questions were answered to the patient's satisfaction.   Cath Lab Visit (complete for each Cath Lab visit)  Clinical Evaluation Leading to the Procedure:   ACS: No.  Non-ACS:    Anginal Classification: CCS I  Anti-ischemic medical therapy: Maximal Therapy (2 or more classes of medications)  Non-Invasive Test Results: No non-invasive testing performed  Prior CABG: No previous CABG        Theron Arista [facility] 01/08/2021 11:22 AM

## 2021-01-09 ENCOUNTER — Telehealth (HOSPITAL_COMMUNITY): Payer: Self-pay | Admitting: Pharmacist

## 2021-01-09 ENCOUNTER — Other Ambulatory Visit (HOSPITAL_COMMUNITY): Payer: Self-pay | Admitting: Cardiology

## 2021-01-09 ENCOUNTER — Encounter (HOSPITAL_COMMUNITY): Payer: Self-pay | Admitting: Cardiology

## 2021-01-09 DIAGNOSIS — I472 Ventricular tachycardia: Secondary | ICD-10-CM | POA: Diagnosis not present

## 2021-01-09 LAB — BASIC METABOLIC PANEL
Anion gap: 9 (ref 5–15)
BUN: 13 mg/dL (ref 6–20)
CO2: 28 mmol/L (ref 22–32)
Calcium: 8.7 mg/dL — ABNORMAL LOW (ref 8.9–10.3)
Chloride: 99 mmol/L (ref 98–111)
Creatinine, Ser: 0.85 mg/dL (ref 0.44–1.00)
GFR, Estimated: >60 mL/min (ref >60)
Glucose, Bld: 88 mg/dL (ref 70–99)
Potassium: 4.1 mmol/L (ref 3.5–5.1)
Sodium: 136 mmol/L (ref 135–145)

## 2021-01-09 LAB — CBC
HCT: 40.2 % (ref 36.0–46.0)
Hemoglobin: 12.7 g/dL (ref 12.0–15.0)
MCH: 29.6 pg (ref 26.0–34.0)
MCHC: 31.6 g/dL (ref 30.0–36.0)
MCV: 93.7 fL (ref 80.0–100.0)
Platelets: 85 10*3/uL — ABNORMAL LOW (ref 150–400)
RBC: 4.29 MIL/uL (ref 3.87–5.11)
RDW: 13.5 % (ref 11.5–15.5)
WBC: 7.2 10*3/uL (ref 4.0–10.5)
nRBC: 0 % (ref 0.0–0.2)

## 2021-01-09 LAB — APTT: aPTT: 85 seconds — ABNORMAL HIGH (ref 24–36)

## 2021-01-09 LAB — PROTIME-INR
INR: 1.9 — ABNORMAL HIGH (ref 0.8–1.2)
Prothrombin Time: 20.9 seconds — ABNORMAL HIGH (ref 11.4–15.2)

## 2021-01-09 LAB — MAGNESIUM: Magnesium: 2.1 mg/dL (ref 1.7–2.4)

## 2021-01-09 MED ORDER — WARFARIN SODIUM 5 MG PO TABS: 10.0000 mg | ORAL_TABLET | Freq: Once | ORAL | Status: DC

## 2021-01-09 MED ORDER — ENOXAPARIN SODIUM 100 MG/ML ~~LOC~~ SOLN: 100.0000 mg | Freq: Two times a day (BID) | SUBCUTANEOUS | 0 refills | Status: DC

## 2021-01-09 MED ORDER — CLOPIDOGREL BISULFATE 75 MG PO TABS: 75.0000 mg | ORAL_TABLET | Freq: Every day | ORAL | 11 refills | Status: DC

## 2021-01-09 MED ORDER — MEXILETINE HCL 150 MG PO CAPS: 150.0000 mg | ORAL_CAPSULE | Freq: Three times a day (TID) | ORAL | 5 refills | Status: DC

## 2021-01-09 MED ORDER — DIGOXIN 62.5 MCG PO TABS: 0.0625 mg | ORAL_TABLET | Freq: Every day | ORAL | 5 refills | Status: DC

## 2021-01-09 MED ORDER — ENOXAPARIN SODIUM 100 MG/ML ~~LOC~~ SOLN
100.0000 mg | Freq: Two times a day (BID) | SUBCUTANEOUS | Status: DC
  Administered 2021-01-09: 100 mg via SUBCUTANEOUS
  Filled 2021-01-09: qty 1

## 2021-01-09 MED ORDER — AMIODARONE HCL 200 MG PO TABS: ORAL_TABLET | ORAL | 0 refills | Status: DC

## 2021-01-09 MED FILL — AMIODARONE HCL 200 MG TABS: 200 | 28 days supply | Qty: 75 | Fill #0

## 2021-01-09 MED FILL — CLOPIDOGREL 75 MG TABLET: 75 | 30 days supply | Qty: 30 | Fill #0

## 2021-01-09 MED FILL — DIGOXIN 0.125 MG TABLET: 125 | 30 days supply | Qty: 15 | Fill #0

## 2021-01-09 NOTE — Progress Notes (Signed)
CARDIAC REHAB PHASE I   PRE:  Rate/Rhythm: 73 SR  BP:  Supine: 112/53  Sitting:   Standing:    SaO2: 94%RA  MODE:  Ambulation: 500 ft   POST:  Rate/Rhythm: 88 SR  BP:  Supine: 129/80  Sitting:   Standing:    SaO2: 95%RA 0930-1015 Pt walked 500 ft on RA with steady gait and no CP. Tolerated well. Stressed importance of plavix with stent. Reviewed NTG use, gave low sodium and heart healthy diets, encouraged smoking cessation and gave walking instructions for ex.  Discussed CRP 2 and referred to [facility] program. Gave smoking cessation handout. Pt stated she quit once for 4 years but restarted when there were several deaths in family. Pt stated she knows what works for her and will use plan that worked before. Pt weighs daily and knows when to call MD with weight gain. Pt voiced understanding of ed.    Luetta Nutting, RN BSN  01/09/2021 10:56 AM

## 2021-01-09 NOTE — Plan of Care (Signed)
°  Problem: Education: Goal: Knowledge of General Education information will improve Description: Including pain rating scale, medication(s)/side effects and non-pharmacologic comfort measures Outcome: Progressing   Problem: Health Behavior/Discharge Planning: Goal: Ability to manage health-related needs will improve Outcome: Progressing   Problem: Clinical Measurements: Goal: Ability to maintain clinical measurements within normal limits will improve Outcome: Progressing   Problem: Activity: Goal: Risk for activity intolerance will decrease Outcome: Progressing   Problem: Coping: Goal: Level of anxiety will decrease Outcome: Progressing   Problem: Pain Managment: Goal: General experience of comfort will improve Outcome: Progressing   Problem: Safety: Goal: Ability to remain free from injury will improve Outcome: Progressing   

## 2021-01-09 NOTE — Progress Notes (Signed)
ANTICOAGULATION CONSULT NOTE  Pharmacy Consult for bivalirudin  Indication: chest pain/ACS and afib, prior to PCI  Allergies  Allergen Reactions  . Beef-Derived Products Anaphylaxis    From tick bite  . Metoclopramide Hcl Anaphylaxis    Non responsive  . Penicillins Anaphylaxis    Pt states she can take any cephalosporin (per pt. 01/01/19) Shock Has patient had a PCN reaction causing immediate rash, facial/tongue/throat swelling, SOB or lightheadedness with hypotension: Yes Has patient had a PCN reaction causing severe rash involving mucus membranes or skin necrosis: No Has patient had a PCN reaction that required hospitalization Yes Has patient had a PCN reaction occurring within the last 10 years: No If all of the above answers are "NO", then may proceed with Cephalosporin   . Pork-Derived Products Anaphylaxis    From tick bite. Has tolerated heparin  . Metoprolol Other (See Comments)    Syncope   . Reglan [Metoclopramide]   . Statins Other (See Comments)    Myalgias    Patient Measurements: Height: 5' 5.5" (166.4 cm) Weight: 94.6 kg (208 lb 8 oz) IBW/kg (Calculated) : 58.15  Vital Signs: Temp: 98.7 F (37.1 C) (01/11 0432) Temp Source: Oral (01/11 0432) BP: 117/66 (01/11 0441) Pulse Rate: 68 (01/11 0432)  Labs: Recent Labs    01/07/21 0403 01/07/21 1207 01/07/21 1452 01/07/21 2013 01/08/21 0458 01/09/21 0226  HGB 12.5  --   --   --  12.9 12.7  HCT 37.9  --   --   --  38.6 40.2  PLT 112* 117*  --   --  90* 85*  APTT  --  39*   < > 113* 88* 85*  LABPROT 19.2*  --   --   --  23.5* 20.9*  INR 1.7*  --   --   --  2.2* 1.9*  CREATININE 0.96  --   --   --  0.98 0.85   < > = values in this interval not displayed.    Estimated Creatinine Clearance: 83.9 mL/min (by C-G formula based on SCr of 0.85 mg/dL).  Assessment: 59yof admitted after ICD shock for VT. Hx HF, Afib s/i DCCV >SR 12/21, and CAD.Pt takes warfarin outpatient for anticoagulation which has been  held (outpatient warfarin regimen is 5mg  Mon, Wed, Fri, Sat and 10mg  Tues, Thurs, Sun).   Pt has PMH of thrombocytopenia, therefore pharmacy is was consulted to start bivalirudin infusion prior to PCI which is planned for Monday 01/08/21.  Bivalirudin and Coumadin resumed after cath 1/10.  APTT 85 this morning, just slightly above goal.  Pltc 85.  INR will be falsely elevated from bivalirudin infusion - will target INR = 3 (hopefully will roughly equate to "real" INR of ~ 2) before stopping bivalirudin.   Goal of Therapy:  APTT 50-80 seconds Monitor platelets by anticoagulation protocol: Yes   Plan:  Decrease bivalirudin to 0.12 mg/kg/hr. Check aPTT 4 hrs after gtt reduced. Coumadin 10 mg x 1 tonight. Daily aPTT, INR.  Monday, 03/08/21, [facility] Clinical Pharmacist  01/09/2021 7:13 AM   [facility] pharmacy phone numbers are listed on [email or web address]

## 2021-01-09 NOTE — Discharge Summary (Signed)
Advanced Heart Failure Team  Discharge Summary   Patient ID: [name] [name] MRN: [id number], DOB/AGE: [birth year] 60 y.o. 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  PVCs PAD Tobacco Abuse    Hospital Course:   60 y.o.female with history of CVA, anti-phospholipid antibody syndrome, CAD, ischemic cardiomyopathy, and persistent atrial fibrillation was initially referred to the [facility] by Dr. Diona Browner for evaluation of CHF. Patient had a cath in 2008 in setting of an MI in [place]. Diagonal and LCx were totally occluded, there was diffuse LAD disease.  She has a Environmental manager ICD. Echo in 8/21 showed EF 15-20%, anteroseptal akinesis, normal RV, severe LAE, mild MR. She was admitted in 8/21 with CHF exacerbation for 6 days.She went into atrial fibrillationin 11/21.   In 12/21, she had TEE-guided DCCV back to NSR. TEE showed EF 25-30%, mildly decreased RV systolic function, moderate MR.   Later in 12/21, she was admitted with syncope and VT with ICD shock. RHC/LHC showed chronically occluded D1 and 95% proximal LCx; normal filling pressures with CI 3.0. Platelets were low, no intervention was done. Amiodarone was started. Placed on taper to continue 200 mg bid x 7 days, followed by 200 mg daily.   She had post hospital f/u w/ Dr. Shirlee Latch 12/25/20 and denied CP and no further ICD shocks but did endorse mild orthostatic symptoms, limiting med titration. Amiodaorne was reduced down to 200 mg once daily. CBC was ordered to recheck platelets, with intent to consider PCI if improved. Plt ct had improved from 70>>148K.   On 1/6, device clinic received Latitude alert for 3 failed ATP and 1 ICD shock for VT that occurred the  morning of 1/5. Pt was awake and watching TV and does not remember any specific symptoms around the time of ICD fire. She did have an episode of chest pain later that evening that resolved w/ SLNGT x 2. Given VT/ICD shock, she was advised to go to ED.   In ED, K 3.9. Mg not checked. Digoxin level 0.9 Hs troponin 19>>17>>16. COVID negative. WBC 10.2. Hgb 13. Platelets up to 214K on admit. INR 3.1.   EP evaluated and suspected this was likely scar mediated VT from significant coronary disease. Amiodarone was increased back to 400 mg bid and she was started on Mexiletine 150 mg tid. She was admitted and had no further VT. Given improvement in platelet count, it was decided to proceed w/ elective PCI of her LCx. Coumadin was held and bivalirudin started after INR dropped < 2.0. Platelets dropped again, down to 85K. Hematology was consulted and felt that she likely has chronic ITP with fluctuation of the platelet count with intercurrent illnesses. Hematology felt ok to proceed with a cardiac catheterization/intervention with the platelet count at this level and recommended empiric trial of IVIG or prednisone, in the future, if the platelet count falls to less than 50,000.  On 1/10, she underwent successful PCI of the LCx with DES x 1. No post cath complications. She will  continue Plavix at least 6 months. No ASA.   She had no further VT.   Post cath, coumadin was restarted + Lovenox bridge, which she will continue on d/c. Has coumadin clinic f/u on 1/13 for repeat INR check.  EP recommended continuation of amiodarone  400 mg bid for a total of 2 weeks (through 1/21), then reduce to 400 mg daily x 2 weeks, followed by 200 mg daily + mexiletine 150 mg tid. She was advised no driving x 6 months. EP f/u arranged.   She was last seen and examined by Dr. Shirlee Latch on 1/11 and felt stable for d/c home. Post hospital f/u arranged in the [facility].   She will also f/u w/ Dr. Truett Perna in hematology clinic (scheduler  contacted for post hospital f/u).     Discharge Weight Range: 208 lb   Discharge Vitals: Blood pressure 124/65, pulse 61, temperature 97.6 F (36.4 C), temperature source Oral, resp. rate 18, height 5' 5.5" (1.664 m), weight 94.6 kg, SpO2 97 %.  Labs: Lab Results  Component Value Date   WBC 7.2 01/09/2021   HGB 12.7 01/09/2021   HCT 40.2 01/09/2021   MCV 93.7 01/09/2021   PLT 85 (L) 01/09/2021    Recent Labs  Lab 01/09/21 0226  NA 136  K 4.1  CL 99  CO2 28  BUN 13  CREATININE 0.85  CALCIUM 8.7*  GLUCOSE 88   Lab Results  Component Value Date   CHOL 120 12/16/2020   HDL 31 (L) 12/16/2020   LDLCALC 72 12/16/2020   TRIG 85 12/16/2020   BNP (last 3 results) Recent Labs    08/11/20 0230 12/14/20 1921  BNP 571.0* 209.0*    ProBNP (last 3 results) No results for input(s): PROBNP in the last 8760 hours.   Diagnostic Studies/Procedures   CARDIAC CATHETERIZATION  Result Date: 01/08/2021  Prox Cx to Mid Cx lesion is 95% stenosed.  Post intervention, there is a 0% residual stenosis.  A drug-eluting stent was successfully placed using a STENT RESOLUTE ONYX 2.25X22.  1. Successful PCI of the LCx with DES x 1 Plan: recommend Plavix and Coumadin. Would not Discharge on ASA due to increased bleeding risk. Will resume IV angiomax until coumadin resumed and therapeutic.    Discharge Medications   Allergies as of 01/09/2021      Reactions   Beef-derived Products Anaphylaxis   From tick bite   Metoclopramide Hcl Anaphylaxis   Non responsive   Penicillins Anaphylaxis   Pt states she can take any cephalosporin (per pt. 01/01/19) Shock Has patient had a PCN reaction causing immediate rash, facial/tongue/throat swelling, SOB or lightheadedness with hypotension: Yes Has patient had a PCN reaction causing severe rash involving mucus membranes or skin necrosis: No Has patient had a PCN reaction that required hospitalization Yes Has patient had a PCN reaction occurring  within the last 10 years: No If all of the above answers are "NO", then may proceed with Cephalosporin    Pork-derived Products Anaphylaxis   From tick bite. Has tolerated heparin/lovenox   Metoprolol Other (See Comments)   Syncope   Reglan [metoclopramide]    Statins Other (See Comments)   Myalgias      Medication List    STOP taking these medications   aspirin EC 81 MG tablet   dapagliflozin propanediol 10 MG Tabs tablet Commonly known as: FARXIGA     TAKE these medications   amiodarone 200 MG tablet Commonly known as: PACERONE Take 400 mg twice daily until 1/21, then reduce to 400 mg once daily until 2/4, then reduce to 200 mg daily What changed:   how much to take  how to take this  when to take this  additional instructions   carvedilol 6.25 MG tablet Commonly known as: COREG  Take 1 tablet (6.25 mg total) by mouth 2 (two) times daily with a meal.   clopidogrel 75 MG tablet Commonly known as: PLAVIX Take 1 tablet (75 mg total) by mouth daily with breakfast. Start taking on: January 10, 2021   Digoxin 62.5 MCG Tabs Take 0.0625 mg by mouth daily. Start taking on: January 10, 2021 What changed:   medication strength  how much to take   DULoxetine 30 MG capsule Commonly known as: CYMBALTA Take 30 mg by mouth daily.   DULoxetine 60 MG capsule Commonly known as: CYMBALTA Take 60 mg by mouth every evening.   empagliflozin 10 MG Tabs tablet Commonly known as: Jardiance Take 1 tablet (10 mg total) by mouth daily before breakfast.   enoxaparin 100 MG/ML injection Commonly known as: LOVENOX Inject 1 mL (100 mg total) into the skin every 12 (twelve) hours for 10 days.   Entresto 24-26 MG Generic drug: sacubitril-valsartan Take 1 tablet by mouth 2 (two) times daily.   ezetimibe 10 MG tablet Commonly known as: ZETIA Take 1 tablet (10 mg total) by mouth daily.   famotidine 20 MG tablet Commonly known as: PEPCID Take 1 tablet (20 mg total) by mouth 2  (two) times daily. What changed: when to take this   fluticasone 50 MCG/ACT nasal spray Commonly known as: FLONASE Place 1 spray into both nostrils daily as needed for allergies.   gabapentin 300 MG capsule Commonly known as: NEURONTIN Take 300 mg by mouth See admin instructions. Take 300 mg  in the AM and 600 mg  in the PM   LORazepam 0.5 MG tablet Commonly known as: ATIVAN Take 0.5 mg by mouth 3 (three) times daily as needed for anxiety.   mexiletine 150 MG capsule Commonly known as: MEXITIL Take 1 capsule (150 mg total) by mouth every 8 (eight) hours.   multivitamins ther. w/minerals Tabs tablet Take 1 tablet by mouth daily.   nitroGLYCERIN 0.4 MG SL tablet Commonly known as: NITROSTAT PLACE 1 TABLET UNDER THE TONGUE EVERY 5 MINUTES FOR 3 DOSES AS NEEDED CHEST PAIN What changed: See the new instructions.   oxyCODONE-acetaminophen 10-325 MG tablet Commonly known as: PERCOCET Take 1 tablet by mouth every 4 (four) hours as needed for pain.   potassium chloride SA 20 MEQ tablet Commonly known as: KLOR-CON TAKE 1 TABLET BY MOUTH EVERY DAY   promethazine 25 MG tablet Commonly known as: PHENERGAN Take 25 mg by mouth every 6 (six) hours as needed for nausea. With pain medication   spironolactone 25 MG tablet Commonly known as: ALDACTONE Take 1 tablet (25 mg total) by mouth daily.   torsemide 20 MG tablet Commonly known as: DEMADEX Take 4 tablets (80 mg total) by mouth daily. May take extra tablet as needed   warfarin 10 MG tablet Commonly known as: COUMADIN Take as directed. If you are unsure how to take this medication, talk to your nurse or doctor. Original instructions: TAKE 1 TABLET BY MOUTH EVERY DAY EXCEPT 1/2 TABLET ON MONDAYS AND THURSDAYS What changed: See the new instructions.       Disposition   The patient will be discharged in stable condition to home.   Follow-up Information    Graciella Freer, PA-C Follow up.   Specialty: Physician  Assistant Why: on 02/01/21 at 1210 for post hospital follow up Contact information: [address] [place] [address] [phone]        [name], MD Follow up on 01/30/2021.  Specialty: Cardiology Why: [facility], Entrance C 3:00 PM  Contact information: [address] [place] [address] [phone]        [facility] Follow up on 01/11/2021.   Specialty: Cardiology Why: 11:45 AM Coumadin Clinic for INR Check  Contact information: [address] [place] [address] [phone]       Ladene Artist, MD. Schedule an appointment as soon as possible for a visit.   Specialty: Oncology Contact information: [address] [place] [address] [phone]                 Duration of Discharge Encounter: Greater than 35 minutes   Signed, Knute Neu  01/09/2021, 2:37 PM

## 2021-01-09 NOTE — Progress Notes (Signed)
ANTICOAGULATION CONSULT NOTE  Pharmacy Consult for bivalirudin  Indication: chest pain/ACS and afib, prior to PCI  Labs: Recent Labs    01/07/21 0403 01/07/21 1207 01/07/21 1452 01/07/21 2013 01/08/21 0458 01/09/21 0226  HGB 12.5  --   --   --  12.9 12.7  HCT 37.9  --   --   --  38.6 40.2  PLT 112* 117*  --   --  90* 85*  APTT  --  39*   < > 113* 88* 85*  LABPROT 19.2*  --   --   --  23.5* 20.9*  INR 1.7*  --   --   --  2.2* 1.9*  CREATININE 0.96  --   --   --  0.98 0.85   < > = values in this interval not displayed.    Assessment: 59yof admitted after ICD shock for VT. Hx HF, Afib s/i DCCV >SR 12/21, and CAD.Pt takes warfarin outpatient for anticoagulation which has been held (outpatient warfarin regimen is 5mg  Mon, Wed, Fri, Sat and 10mg  Tues, Thurs, Sun). Pt has PMH of thrombocytopenia and platelets have decreased from 214 on admission on 1/6 to 112 this morning. Therefore pharmacy is now consulted to start bivalirudin infusion prior to PCI which is planned for Monday 01/08/21.  APTT 85 sec this am  Goal of Therapy:  aPTT 50-85 seconds Monitor platelets by anticoagulation protocol: Yes   Plan:  Continue bivalirudin at 0.15 mg/kg/hr Daily aPTT, INR.  Thanks for allowing pharmacy to be a part of this patient's care.  Sunday, PharmD Clinical Pharmacist

## 2021-01-09 NOTE — Progress Notes (Signed)
Patient ID: [name] [name], female   DOB: [birth year], 60 y.o.   MRN: [id number]     Advanced Heart Failure Rounding Note  PCP-Cardiologist: Nona Dell, MD   Subjective:    No further VT. Denies CP.   Successful PCI to LCx yesterday.   Platelets stable 85 today, no bleeding.   Resting comfortably in bed. No complaints.    Objective:   Weight Range: 94.6 kg Body mass index is 34.17 kg/m.   Vital Signs:   Temp:  [97.6 F (36.4 C)-98.7 F (37.1 C)] 97.6 F (36.4 C) (01/11 0741) Pulse Rate:  [53-68] 61 (01/11 0841) Resp:  [9-19] 18 (01/11 0741) BP: (93-124)/(38-89) 124/65 (01/11 0741) SpO2:  [90 %-100 %] 97 % (01/11 0741) Weight:  [94.6 kg] 94.6 kg (01/11 0432)    Weight change: Filed Weights   01/04/21 2002 01/08/21 0429 01/09/21 0432  Weight: 92.5 kg 92.9 kg 94.6 kg    Intake/Output:   Intake/Output Summary (Last 24 hours) at 01/09/2021 0858 Last data filed at 01/08/2021 2000 Gross per 24 hour  Intake 762.57 ml  Output --  Net 762.57 ml      Physical Exam    General: NAD Neck: No JVD, no thyromegaly or thyroid nodule.  Lungs: Clear to auscultation bilaterally with normal respiratory effort. CV: Nondisplaced PMI.  Heart regular S1/S2, no S3/S4, no murmur.  No peripheral edema.   Abdomen: Soft, nontender, no hepatosplenomegaly, no distention.  Skin: Intact without lesions or rashes.  Neurologic: Alert and oriented x 3.  Psych: Normal affect. Extremities: No clubbing or cyanosis.  HEENT: Normal.   Telemetry   NSR 60s. No VT  (personally reviewed)  Labs    CBC Recent Labs    01/08/21 0458 01/09/21 0226  WBC 8.5 7.2  HGB 12.9 12.7  HCT 38.6 40.2  MCV 91.9 93.7  PLT 90* 85*   Basic Metabolic Panel Recent Labs    53/97/67 0458 01/09/21 0226  NA 136 136  K 3.8 4.1  CL 97* 99  CO2 29 28  GLUCOSE 94 88  BUN 16 13  CREATININE 0.98 0.85  CALCIUM 8.8* 8.7*  MG 2.2 2.1   Liver Function Tests No results for input(s): AST, ALT, ALKPHOS,  BILITOT, PROT, ALBUMIN in the last 72 hours. No results for input(s): LIPASE, AMYLASE in the last 72 hours. Cardiac Enzymes No results for input(s): CKTOTAL, CKMB, CKMBINDEX, TROPONINI in the last 72 hours.  BNP: BNP (last 3 results) Recent Labs    08/11/20 0230 12/14/20 1921  BNP 571.0* 209.0*    ProBNP (last 3 results) No results for input(s): PROBNP in the last 8760 hours.   D-Dimer No results for input(s): DDIMER in the last 72 hours. Hemoglobin A1C No results for input(s): HGBA1C in the last 72 hours. Fasting Lipid Panel No results for input(s): CHOL, HDL, LDLCALC, TRIG, CHOLHDL, LDLDIRECT in the last 72 hours. Thyroid Function Tests No results for input(s): TSH, T4TOTAL, T3FREE, THYROIDAB in the last 72 hours.  Invalid input(s): FREET3  Other results:   Imaging    CARDIAC CATHETERIZATION  Result Date: 01/08/2021  Prox Cx to Mid Cx lesion is 95% stenosed.  Post intervention, there is a 0% residual stenosis.  A drug-eluting stent was successfully placed using a STENT RESOLUTE ONYX 2.25X22.  1. Successful PCI of the LCx with DES x 1 Plan: recommend Plavix and Coumadin. Would not Discharge on ASA due to increased bleeding risk. Will resume IV angiomax until coumadin resumed and therapeutic.  Medications:     Scheduled Medications: . amiodarone  400 mg Oral BID  . aspirin EC  81 mg Oral Daily  . carvedilol  6.25 mg Oral BID WC  . clopidogrel  75 mg Oral Q breakfast  . dapagliflozin propanediol  10 mg Oral Daily  . digoxin  0.0625 mg Oral Daily  . DULoxetine  30 mg Oral Daily  . DULoxetine  60 mg Oral QPM  . ezetimibe  10 mg Oral Daily  . famotidine  20 mg Oral Daily  . gabapentin  300 mg Oral Daily  . gabapentin  600 mg Oral QHS  . mexiletine  150 mg Oral Q8H  . potassium chloride SA  20 mEq Oral Daily  . sacubitril-valsartan  1 tablet Oral BID  . sodium chloride flush  3 mL Intravenous Q12H  . sodium chloride flush  3 mL Intravenous Q12H  .  spironolactone  25 mg Oral Daily  . torsemide  80 mg Oral Daily  . warfarin  10 mg Oral ONCE-1600  . Warfarin - Pharmacist Dosing Inpatient   Does not apply q1600    Infusions: . sodium chloride    . bivalirudin (ANGIOMAX) infusion 0.5 mg/mL (Non-ACS indications) 0.12 mg/kg/hr (01/09/21 0839)    PRN Medications: sodium chloride, acetaminophen, LORazepam, nitroGLYCERIN, ondansetron (ZOFRAN) IV, oxyCODONE-acetaminophen **AND** oxyCODONE, sodium chloride flush, zolpidem   Assessment/Plan   1. CAD: MI in 2008 in [facility], cath at the time showed totally occluded diagonal, totally occluded LCX, and diffuse LAD disease. No intervention. LHC was done in 12/21 due to VT, this showed chronically occluded D1 and 95% proximal LCx. No intervention as she did not have chest pain and platelets were low. She is now back with VT, ?scar mediated.  Troponin negative.  This admission, has had PCI to LCx.  - Continue ASA 81 daily until discharge then stop.  - Continue Plavix at least 6 months.  - Continue Zetia, has not tolerated statins. Good lipids in 12/21.  2. Atrial fibrillation:TEE-guided DCCV to NSR in 12/21. She is in NSR today.  -If atrial fibrillation recurs, will need to consider ablation. - She is on amiodarone.  3. Carotid stenosis: Known RICA occlusion.Dopplers in 12/21 with <50% LICA. -Repeat dopplers in 12/22. 4. 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  - Warfarin restarted yesterday.  She is on bivalirudin currently, will let her go home and complete bridge with Lovenox.  5. Chronic systolic CHF: Ischemic cardiomyopathy. [employer] ICD. Echo (8/21)with EF 15-20%, anteroseptal AK, RV normal, severe LAE, mild MR. TEE (12/21) with EF 25-30%. RHC in 12/21 with preserved cardiac output and optimized filling pressures. NYHA class II symptoms, not volume overloaded on exam.BP stable.   -Continue Entresto 24/26 bid. -ContinueFarxiga 10 mg daily.   -Continue spironolactone 25 mg daily. - Continue Coreg6.25mg  bid.  - Continue torsemide 80 mg daily.  - Continue digoxin 0.125 daily, digoxin level 0.9.  6. Smoking: Active smoker, wants to stop on her own.  7. PAD: She denies claudication.  - Needs to quit smoking. 8. Thrombocytopenia: Dropped to 70K during last hospitalization with uncertain etiology.  Increased back to normal range after discharge. Plts > 200K at admission, now dropped to 90K.  She has history of APLAS.  She has not had heparin products so not HIT.  Only new med is mexiletine; she was not on this, however, when plts fell last admission.  Hematology has seen this admit. Suspect she has chronic ITP with fluctuation of the platelet count  with intercurrent illnesses.  - Can consider empiric trial of IVIG or prednisone if the platelet count falls to less than 50,000 in the future.  - Followup as outpatient with Dr. Truett Perna. 9. Mitral regurgitation: Moderate on 12/21 TEE, suspect functional.  10. VT: Suspect scar-mediated in 12/21 with ICD discharge and syncope.  Again with VT 01/03/21 with ICD discharge, did not pass out.  Troponin not elevated.  - No driving x 6 months.  - Reloading amiodarone and adding mexiletine per EP.  - As above, plan to intervene on LCx.  11. PVCs: Frequent, multiple morphologies so probably not amenable to ablation.  - Antiarrhythmics as above.   Disposition: Home today.  She will followup with me or APP in CHF clinic 2 wks.  Needs to see Dr. Truett Perna in hematology.  Needs coumadin clinic followup for Lovenox bridge.  Meds for home: Lovenox/coumadin overlap until INR 2, amiodarone (will need to discuss taper with EP prior to discharge), mexiletine (discuss home dose with EP prior to discharge), Plavix 75 mg daily, Zetia 10 mg daily, Coreg 6.25 mg bid, Entresto 24/26 bid, spironolactone 25 daily, torsemide 80 daily, dapagliflozin 10 daily, digoxin 0.0625 daily.  No ASA.   Length of Stay:  3  [name], MD  01/09/2021, 8:58 AM  Advanced Heart Failure Team Pager [phone] (M-F; 7a - 4p)  Please contact [facility] for night-coverage after hours (4p -7a ) and weekends on [email or web address]

## 2021-01-09 NOTE — Telephone Encounter (Signed)
Advanced Heart Failure Patient Advocate Encounter  Prior Authorization for mexiletine has been approved.    Effective dates: 01/09/21 through 12/29/21  Karle Plumber, PharmD, BCPS, BCCP, CPP Heart Failure Clinic Pharmacist [phone]

## 2021-01-09 NOTE — Telephone Encounter (Signed)
Patient Advocate Encounter   Received notification from [facility] that prior authorization for mexilitine is required.   PA submitted on CoverMyMeds Key [id number] Status is pending   Will continue to follow.   Karle Plumber, PharmD, BCPS, BCCP, CPP Heart Failure Clinic Pharmacist [phone]

## 2021-01-09 NOTE — Progress Notes (Signed)
ANTICOAGULATION CONSULT NOTE  Pharmacy Consult for bivalirudin > switch to Lovenox Indication: chest pain/ACS and afib, prior to PCI  Allergies  Allergen Reactions  . Beef-Derived Products Anaphylaxis    From tick bite  . Metoclopramide Hcl Anaphylaxis    Non responsive  . Penicillins Anaphylaxis    Pt states she can take any cephalosporin (per pt. 01/01/19) Shock Has patient had a PCN reaction causing immediate rash, facial/tongue/throat swelling, SOB or lightheadedness with hypotension: Yes Has patient had a PCN reaction causing severe rash involving mucus membranes or skin necrosis: No Has patient had a PCN reaction that required hospitalization Yes Has patient had a PCN reaction occurring within the last 10 years: No If all of the above answers are "NO", then may proceed with Cephalosporin   . Pork-Derived Products Anaphylaxis    From tick bite. Has tolerated heparin/lovenox  . Metoprolol Other (See Comments)    Syncope   . Reglan [Metoclopramide]   . Statins Other (See Comments)    Myalgias    Patient Measurements: Height: 5' 5.5" (166.4 cm) Weight: 94.6 kg (208 lb 8 oz) IBW/kg (Calculated) : 58.15  Vital Signs: Temp: 97.6 F (36.4 C) (01/11 0741) Temp Source: Oral (01/11 0741) BP: 124/65 (01/11 0741) Pulse Rate: 61 (01/11 0841)  Labs: Recent Labs    01/07/21 0403 01/07/21 1207 01/07/21 1452 01/07/21 2013 01/08/21 0458 01/09/21 0226  HGB 12.5  --   --   --  12.9 12.7  HCT 37.9  --   --   --  38.6 40.2  PLT 112* 117*  --   --  90* 85*  APTT  --  39*   < > 113* 88* 85*  LABPROT 19.2*  --   --   --  23.5* 20.9*  INR 1.7*  --   --   --  2.2* 1.9*  CREATININE 0.96  --   --   --  0.98 0.85   < > = values in this interval not displayed.    Estimated Creatinine Clearance: 83.9 mL/min (by C-G formula based on SCr of 0.85 mg/dL).  Assessment: 59yof admitted after ICD shock for VT. Hx HF, Afib s/i DCCV >SR 12/21, and CAD.Pt takes warfarin outpatient for  anticoagulation which has been held (outpatient warfarin regimen is 5mg  Mon, Wed, Fri, Sat and 10mg  Tues, Thurs, Sun).   Pt has PMH of thrombocytopenia, therefore pharmacy is was consulted to start bivalirudin infusion prior to PCI which is planned for Monday 01/08/21.  Bivalirudin and Coumadin resumed after cath 1/10.  APTT 85 this morning, just slightly above goal.  Pltc 85.  INR will be falsely elevated from bivalirudin infusion - will target INR = 3 (hopefully will roughly equate to "real" INR of ~ 2) before stopping bivalirudin.   Asked this morning to change IV bivalirudin to lovenox in anticipation of discharge today.  Goal of Therapy:  Monitor platelets by anticoagulation protocol: Yes   Plan:  Stop IV bivalirudin now. Give Lovenox 100 mg sq q 12 hrs. Coumadin 10 mg po x 1 tonight.  Monday, 03/08/21, BCCP Clinical Pharmacist  01/09/2021 9:20 AM   [facility] pharmacy phone numbers are listed on [email or web address]

## 2021-01-10 ENCOUNTER — Telehealth: Payer: Self-pay | Admitting: *Deleted

## 2021-01-10 DIAGNOSIS — D696 Thrombocytopenia, unspecified: Secondary | ICD-10-CM

## 2021-01-10 NOTE — Telephone Encounter (Signed)
Left VM for patient to go to [facility] lab on 01/12/21 for walk-in lab between 0830 and 1600. Faxed orders with request to send results via Epic or fax. Scheduling message sent for 4 week f/u with labs with Dr. Truett Perna.

## 2021-01-11 ENCOUNTER — Other Ambulatory Visit: Payer: Self-pay

## 2021-01-11 ENCOUNTER — Emergency Department (HOSPITAL_COMMUNITY): Payer: Medicare PPO

## 2021-01-11 ENCOUNTER — Observation Stay (HOSPITAL_COMMUNITY)
Admission: EM | Admit: 2021-01-11 | Discharge: 2021-01-12 | Disposition: A | Discharge status: Home / Self Care | Payer: Medicare PPO | Attending: Cardiology | Admitting: Cardiology

## 2021-01-11 ENCOUNTER — Ambulatory Visit (INDEPENDENT_AMBULATORY_CARE_PROVIDER_SITE_OTHER): Payer: Medicare PPO | Admitting: *Deleted

## 2021-01-11 ENCOUNTER — Telehealth: Payer: Self-pay | Admitting: Oncology

## 2021-01-11 ENCOUNTER — Encounter (HOSPITAL_COMMUNITY): Payer: Self-pay

## 2021-01-11 ENCOUNTER — Telehealth (HOSPITAL_COMMUNITY): Payer: Self-pay | Admitting: *Deleted

## 2021-01-11 DIAGNOSIS — Z79899 Other long term (current) drug therapy: Secondary | ICD-10-CM | POA: Insufficient documentation

## 2021-01-11 DIAGNOSIS — Z7901 Long term (current) use of anticoagulants: Secondary | ICD-10-CM | POA: Insufficient documentation

## 2021-01-11 DIAGNOSIS — I200 Unstable angina: Secondary | ICD-10-CM | POA: Diagnosis not present

## 2021-01-11 DIAGNOSIS — D6861 Antiphospholipid syndrome: Secondary | ICD-10-CM

## 2021-01-11 DIAGNOSIS — E119 Type 2 diabetes mellitus without complications: Secondary | ICD-10-CM | POA: Insufficient documentation

## 2021-01-11 DIAGNOSIS — I5042 Chronic combined systolic (congestive) and diastolic (congestive) heart failure: Secondary | ICD-10-CM | POA: Insufficient documentation

## 2021-01-11 DIAGNOSIS — Z20822 Contact with and (suspected) exposure to covid-19: Secondary | ICD-10-CM | POA: Insufficient documentation

## 2021-01-11 DIAGNOSIS — I110 Hypertensive heart disease with heart failure: Secondary | ICD-10-CM | POA: Diagnosis not present

## 2021-01-11 DIAGNOSIS — D6859 Other primary thrombophilia: Secondary | ICD-10-CM | POA: Diagnosis not present

## 2021-01-11 DIAGNOSIS — R079 Chest pain, unspecified: Secondary | ICD-10-CM | POA: Diagnosis present

## 2021-01-11 DIAGNOSIS — I4819 Other persistent atrial fibrillation: Secondary | ICD-10-CM | POA: Diagnosis not present

## 2021-01-11 DIAGNOSIS — F17210 Nicotine dependence, cigarettes, uncomplicated: Secondary | ICD-10-CM | POA: Diagnosis not present

## 2021-01-11 DIAGNOSIS — I63549 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: Secondary | ICD-10-CM

## 2021-01-11 DIAGNOSIS — R0789 Other chest pain: Secondary | ICD-10-CM | POA: Diagnosis present

## 2021-01-11 DIAGNOSIS — Z5181 Encounter for therapeutic drug level monitoring: Secondary | ICD-10-CM

## 2021-01-11 DIAGNOSIS — I517 Cardiomegaly: Secondary | ICD-10-CM | POA: Diagnosis not present

## 2021-01-11 LAB — CBC
HCT: 42.2 % (ref 36.0–46.0)
Hemoglobin: 13.2 g/dL (ref 12.0–15.0)
MCH: 30.6 pg (ref 26.0–34.0)
MCHC: 31.3 g/dL (ref 30.0–36.0)
MCV: 97.7 fL (ref 80.0–100.0)
Platelets: 67 10*3/uL — ABNORMAL LOW (ref 150–400)
RBC: 4.32 MIL/uL (ref 3.87–5.11)
RDW: 13.8 % (ref 11.5–15.5)
WBC: 8.1 10*3/uL (ref 4.0–10.5)
nRBC: 0 % (ref 0.0–0.2)

## 2021-01-11 LAB — BASIC METABOLIC PANEL
Anion gap: 11 (ref 5–15)
BUN: 18 mg/dL (ref 6–20)
CO2: 27 mmol/L (ref 22–32)
Calcium: 8.6 mg/dL — ABNORMAL LOW (ref 8.9–10.3)
Chloride: 93 mmol/L — ABNORMAL LOW (ref 98–111)
Creatinine, Ser: 1.2 mg/dL — ABNORMAL HIGH (ref 0.44–1.00)
GFR, Estimated: 53 mL/min — ABNORMAL LOW (ref >60)
Glucose, Bld: 105 mg/dL — ABNORMAL HIGH (ref 70–99)
Potassium: 3.7 mmol/L (ref 3.5–5.1)
Sodium: 131 mmol/L — ABNORMAL LOW (ref 135–145)

## 2021-01-11 LAB — RESP PANEL BY RT-PCR (RSV, FLU A&B, COVID)  RVPGX2
Influenza A by PCR: NEGATIVE
Influenza B by PCR: NEGATIVE
Resp Syncytial Virus by PCR: NEGATIVE
SARS Coronavirus 2 by RT PCR: NEGATIVE

## 2021-01-11 LAB — PROTIME-INR
INR: 2.4 — ABNORMAL HIGH (ref 0.8–1.2)
Prothrombin Time: 25.5 seconds — ABNORMAL HIGH (ref 11.4–15.2)

## 2021-01-11 LAB — TROPONIN I (HIGH SENSITIVITY)
Troponin I (High Sensitivity): 17 ng/L (ref <18)
Troponin I (High Sensitivity): 15 ng/L (ref <18)

## 2021-01-11 LAB — POCT INR: INR: 2.7 (ref 2.0–3.0)

## 2021-01-11 NOTE — Telephone Encounter (Signed)
Scheduled appt per 1/12 sch msg - pt is aware of appt date and time   

## 2021-01-11 NOTE — Patient Instructions (Signed)
Continue warfarin 1/2 tablet daily except 1 tablets on Sundays and Thursdays On amiodarone 400mg  daily thru this week then decrease to 200mg  daily Recheck on Tuesday

## 2021-01-11 NOTE — Telephone Encounter (Signed)
Pt called stating she is light headed, her arms are heavy, and she's having some chest tightness. Discussed with Dinah Beers. Per Herbert Seta send urgent telephone message to Dr.McLean.   Message routed to Dr.McLean

## 2021-01-11 NOTE — Telephone Encounter (Signed)
Probably needs ER evaluation with ECG and labs.  Can get report ICD interrogation to make sure no arrhythmias.

## 2021-01-11 NOTE — ED Notes (Signed)
Call to [facility] [employer] to come interrogate pacemaker. They state they will page a technician and will call me with ETA.

## 2021-01-11 NOTE — Telephone Encounter (Signed)
Spoke to patient. Shock plan reviewed and advised per [place] DMV no driving x 6 months with the date starting 01/03/21. Verbalized understanding.  Routing to Dr. Ladona Ridgel for review.

## 2021-01-11 NOTE — Telephone Encounter (Signed)
Attempted to call patient to advise shock plan and driving restrictions. No answer, LMOVM.

## 2021-01-11 NOTE — ED Triage Notes (Signed)
Pt reports waking up this morning with left arm feeling heavy. Reports now having chest tightness. Pt reports that she was discharged from hosp and had a stent placed on Monday

## 2021-01-11 NOTE — Telephone Encounter (Signed)
Patient aware and agreeable with plan. Pt said her nephew will take her to the emergency room.

## 2021-01-11 NOTE — ED Provider Notes (Signed)
[facility] EMERGENCY DEPARTMENT Provider Note   CSN: [id number] Arrival date & time: 01/11/21  1541     History Chief Complaint  Patient presents with  . Chest Pain    [name] [name] is a 60 y.o. female.  HPI   This patient is a 60 year old female, she has multiple medical problems including history of chronic congestive heart failure secondary to ischemic cardiomyopathy with a very low ejection fraction around 20%, she has an ICD, she had a recent admission to the hospital for evaluation of arrhythmia and was found to have significant chronic occlusions of her left circumflex and other arteries, because of the depth of the left circumflex obstruction it was thought that this may be in someway related to her arrhythmia and that she had a stent placed approximately 3 days ago.  She was discharged from the hospital 2 days ago feeling well, yesterday was normal, this morning she woke up and had a sudden feeling of not being able to breathe with a heaviness on her chest and felt like both of her arms were intensely heavy.  This has fluctuated throughout the day and at this time her symptoms are extremely mild.  She has no nausea or diaphoresis, she has not been coughing or having fevers.  She is on Coumadin.  Her symptoms have been fluctuating throughout the day.  Severe at the worst, mild at the best.  Told by Cardiology to come to the ER today.    Past Medical History:  Diagnosis Date  . Aneurysm of internal carotid artery   . Anticardiolipin antibody positive    Chronic Coumadin  . Brain aneurysm    followed by Dr. Corliss Skains  . Chronic combined systolic (congestive) and diastolic (congestive) heart failure (HCC)   . Cocaine abuse in remission (HCC)   . Coronary atherosclerosis of native coronary artery    Previous invasive cardiac testing was done at a facility in [place], [place].  Occluded diagonal, Diffuse LAD disease, Occluded Cx, and non obstructive RCA.   . Essential  hypertension   . Headache   . History of pneumonia   . ICD (implantable cardioverter-defibrillator) in place   . Ischemic cardiomyopathy    LVEF 30-35%  . Mixed hyperlipidemia   . NSVT (nonsustained ventricular tachycardia) (HCC)   . Persistent atrial fibrillation (HCC)   . PVD (peripheral vascular disease) (HCC)   . Raynaud's phenomenon   . Statin intolerance   . Stroke [facility]) 2007   Total occlusion of the right internal carotid    Patient Active Problem List   Diagnosis Date Noted  . Unstable angina (HCC) 01/11/2021  . Defibrillator discharge   . VT (ventricular tachycardia) (HCC) 01/04/2021  . CAD in native artery   . Persistent atrial fibrillation (HCC) 12/15/2020  . DM II (diabetes mellitus, type II), controlled (HCC) 12/15/2020  . Chronic systolic CHF (congestive heart failure) (HCC) 12/15/2020  . Ventricular tachycardia (HCC) 12/15/2020  . Right carotid artery occlusion   . Syncope 12/14/2020  . Acute respiratory failure with hypoxia (HCC) 08/11/2020  . Hypokalemia 08/11/2020  . GERD (gastroesophageal reflux disease) 08/11/2020  . Allergic rhinitis 08/11/2020  . SIRS (systemic inflammatory response syndrome) (HCC) 01/06/2019  . HCAP (healthcare-associated pneumonia) 12/31/2018  . H. influenzae infection 12/24/2018  . Influenza A 12/24/2018  . Chest pain 06/18/2017  . Hyponatremia 06/18/2017  . Tobacco dependence 06/18/2017  . 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 Left-sided weakness   .  Chronic combined systolic (congestive) and diastolic (congestive) heart failure (HCC) 07/26/2015  . Encounter for therapeutic drug monitoring 02/02/2014  . Peripheral arterial disease (HCC) 11/09/2013  . Dual implantable cardioverter-defibrillator in situ 06/01/2012  . Long term current use of anticoagulant 03/22/2011  . Essential hypertension, benign 05/29/2010  . CHEST PAIN 05/29/2010  . Hyperlipidemia 09/26/2009   . Peripheral vascular disease (HCC) 06/23/2009  . Cardiomyopathy, ischemic 11/29/2008  . CEREBROVASCULAR DISEASE 11/29/2008  . Anticardiolipin syndrome (HCC) 11/29/2008    Past Surgical History:  Procedure Laterality Date  . ABDOMINAL AORTAGRAM N/A 05/25/2014   Procedure: ABDOMINAL Ronny Flurry;  Surgeon: Iran Ouch, MD;  Location: [facility] CATH LAB;  Service: Cardiovascular;  Laterality: N/A;  . APPENDECTOMY    . CARDIAC DEFIBRILLATOR PLACEMENT     [employer] ICD  . CARDIOVERSION N/A 12/01/2020   Procedure: CARDIOVERSION;  Surgeon: [name], MD;  Location: [facility] ENDOSCOPY;  Service: Cardiovascular;  Laterality: N/A;  . CORONARY STENT INTERVENTION N/A 01/08/2021   Procedure: CORONARY STENT INTERVENTION;  Surgeon: Swazil[place], Peter M, MD;  Location: [facility] INVASIVE CV LAB;  Service: Cardiovascular;  Laterality: N/A;  . EP IMPLANTABLE DEVICE N/A 03/01/2016   Procedure: ICD Generator Changeout;  Surgeon: [name], MD;  Location: [facility] INVASIVE CV LAB;  Service: Cardiovascular;  Laterality: N/A;  . L1 corpectomy   12/10  . Laryngeal polyp excision    . RIGHT/LEFT HEART CATH AND CORONARY ANGIOGRAPHY N/A 12/18/2020   Procedure: RIGHT/LEFT HEART CATH AND CORONARY ANGIOGRAPHY;  Surgeon: Dolores Patty, MD;  Location: [facility] INVASIVE CV LAB;  Service: Cardiovascular;  Laterality: N/A;  . TEE WITHOUT CARDIOVERSION N/A 12/01/2020   Procedure: TRANSESOPHAGEAL ECHOCARDIOGRAM (TEE);  Surgeon: [name], MD;  Location: [facility] ENDOSCOPY;  Service: Cardiovascular;  Laterality: N/A;     OB History   No obstetric history on file.     Family History  Problem Relation Age of Onset  . Heart disease Father 84  . Ankylosing spondylitis Sister   . Stomach cancer Brother 81  . Heart attack Brother     Social History   Tobacco Use  . Smoking status: Current Every Day Smoker    Packs/day: 1.00    Years: 43.00    Pack years: 43.00    Types: Cigarettes    Start date: 03/03/1978    Last attempt to quit:  10/03/2013    Years since quitting: 7.2  . Smokeless tobacco: Never Used  . Tobacco comment: 4 ciggs per day  Vaping Use  . Vaping Use: Every day  Substance Use Topics  . Alcohol use: No    Alcohol/week: 0.0 standard drinks  . Drug use: No    Comment: Prior history of cocaine    Home Medications Prior to Admission medications   Medication Sig Start Date End Date Taking? Authorizing Provider  amiodarone (PACERONE) 200 MG tablet Take 400 mg twice daily until 1/21, then reduce to 400 mg once daily until 2/4, then reduce to 200 mg daily 01/09/21  Yes Sharol Harness, Brittainy M, PA-C  carvedilol (COREG) 6.25 MG tablet Take 1 tablet (6.25 mg total) by mouth 2 (two) times daily with a meal. Patient taking differently: Take 6.25 mg by mouth 2 (two) times daily with a meal. Take 1/2 tablet by mouth twice a day 12/19/20  Yes Robbie Lis M, PA-C  clopidogrel (PLAVIX) 75 MG tablet Take 1 tablet (75 mg total) by mouth daily with breakfast. 01/10/21  Yes Robbie Lis M, PA-C  digoxin 62.5 MCG TABS Take  0.0625 mg by mouth daily. 01/10/21  Yes Robbie Lis M, PA-C  DULoxetine (CYMBALTA) 30 MG capsule Take 30 mg by mouth daily.   Yes [provider]  DULoxetine (CYMBALTA) 60 MG capsule Take 60 mg by mouth every evening.   Yes [provider]  empagliflozin (JARDIANCE) 10 MG TABS tablet Take 1 tablet (10 mg total) by mouth daily before breakfast. 01/03/21  Yes [name], MD  enoxaparin (LOVENOX) 100 MG/ML injection Inject 1 mL (100 mg total) into the skin every 12 (twelve) hours for 10 days. 01/09/21 01/19/21 Yes [name], MD  ezetimibe (ZETIA) 10 MG tablet Take 1 tablet (10 mg total) by mouth daily. 08/16/20 11/17/21 Yes Shah, Pratik D, DO  famotidine (PEPCID) 20 MG tablet Take 1 tablet (20 mg total) by mouth 2 (two) times daily. Patient taking differently: Take 20 mg by mouth daily. 01/06/19 08/25/21 Yes Kari Baars, MD  fluticasone [facility]) 50 MCG/ACT nasal spray  Place 1 spray into both nostrils daily as needed for allergies.  09/24/19  Yes [provider]  gabapentin (NEURONTIN) 300 MG capsule Take 300 mg by mouth See admin instructions. Take 300 mg  in the AM and 600 mg  in the PM   Yes [provider]  LORazepam (ATIVAN) 0.5 MG tablet Take 0.5 mg by mouth 3 (three) times daily as needed for anxiety. 01/01/21  Yes [provider]  mexiletine (MEXITIL) 150 MG capsule Take 1 capsule (150 mg total) by mouth every 8 (eight) hours. 01/09/21  Yes [name], MD  Multiple Vitamins-Minerals (MULTIVITAMINS THER. W/MINERALS) TABS Take 1 tablet by mouth daily.     Yes [provider]  nitroGLYCERIN (NITROSTAT) 0.4 MG SL tablet PLACE 1 TABLET UNDER THE TONGUE EVERY 5 MINUTES FOR 3 DOSES AS NEEDED CHEST PAIN Patient taking differently: Place 0.4 mg under the tongue every 5 (five) minutes as needed for chest pain. 05/04/19  Yes [name], MD  oxyCODONE-acetaminophen (PERCOCET) 10-325 MG tablet Take 1 tablet by mouth every 4 (four) hours as needed for pain.   Yes [provider]  potassium chloride SA (KLOR-CON) 20 MEQ tablet TAKE 1 TABLET BY MOUTH EVERY DAY Patient taking differently: Take 20 mEq by mouth daily. 08/10/20  Yes [name], MD  promethazine (PHENERGAN) 25 MG tablet Take 25 mg by mouth every 6 (six) hours as needed for nausea. With pain medication   Yes [provider]  sacubitril-valsartan (ENTRESTO) 24-26 MG Take 1 tablet by mouth 2 (two) times daily. 10/28/20  Yes Leone Brand, NP  spironolactone (ALDACTONE) 25 MG tablet Take 1 tablet (25 mg total) by mouth daily. 11/28/20  Yes [name], MD  torsemide (DEMADEX) 20 MG tablet Take 4 tablets (80 mg total) by mouth daily. May take extra tablet as needed 09/21/20 11/17/21 Yes [name], MD  warfarin (COUMADIN) 10 MG tablet TAKE 1 TABLET BY MOUTH EVERY DAY EXCEPT 1/2 TABLET ON MONDAYS AND THURSDAYS Patient taking  differently: Take 10 mg by mouth See admin instructions. Take 5 mg Mon. Wed, Friday , and Saturday All the other days take 10 mg in the evening 10/09/20  Yes [name], MD    Allergies    Beef-derived products, Metoclopramide hcl, Penicillins, Pork-derived products, Metoprolol, Reglan [metoclopramide], and Statins  Review of Systems   Review of Systems  All other systems reviewed and are negative.   Physical Exam Updated Vital Signs BP (!) 108/55   Pulse 60  Temp 97.6 F (36.4 C) (Oral)   Resp 12   Ht 1.676 m (5\' 6" )   Wt 92.5 kg   SpO2 96%   BMI 32.93 kg/m   Physical Exam Vitals and nursing note reviewed.  Constitutional:      General: She is not in acute distress.    Appearance: She is well-developed and well-nourished.  HENT:     Head: Normocephalic and atraumatic.     Mouth/Throat:     Mouth: Oropharynx is clear and moist.     Pharynx: No oropharyngeal exudate.  Eyes:     General: No scleral icterus.       Right eye: No discharge.        Left eye: No discharge.     Extraocular Movements: EOM normal.     Conjunctiva/sclera: Conjunctivae normal.     Pupils: Pupils are equal, round, and reactive to light.  Neck:     Thyroid: No thyromegaly.     Vascular: No JVD.  Cardiovascular:     Rate and Rhythm: Normal rate and regular rhythm.     Pulses: Intact distal pulses.     Heart sounds: Normal heart sounds. No murmur heard. No friction rub. No gallop.   Pulmonary:     Effort: Pulmonary effort is normal. No respiratory distress.     Breath sounds: Normal breath sounds. No wheezing or rales.  Abdominal:     General: Bowel sounds are normal. There is no distension.     Palpations: Abdomen is soft. There is no mass.     Tenderness: There is no abdominal tenderness.  Musculoskeletal:        General: No tenderness or edema. Normal range of motion.     Cervical back: Normal range of motion and neck supple.  Lymphadenopathy:     Cervical: No cervical  adenopathy.  Skin:    General: Skin is warm and dry.     Findings: No erythema or rash.  Neurological:     Mental Status: She is alert.     Coordination: Coordination normal.  Psychiatric:        Mood and Affect: Mood and affect normal.        Behavior: Behavior normal.     ED Results / Procedures / Treatments   Labs (all labs ordered are listed, but only abnormal results are displayed) Labs Reviewed  BASIC METABOLIC PANEL - Abnormal; Notable for the following components:      Result Value   Sodium 131 (*)    Chloride 93 (*)    Glucose, Bld 105 (*)    Creatinine, Ser 1.20 (*)    Calcium 8.6 (*)    GFR, Estimated 53 (*)    All other components within normal limits  CBC - Abnormal; Notable for the following components:   Platelets 67 (*)    All other components within normal limits  RESP PANEL BY RT-PCR (RSV, FLU A&B, COVID)  [id number]  PROTIME-INR  TROPONIN I (HIGH SENSITIVITY)  TROPONIN I (HIGH SENSITIVITY)    EKG EKG Interpretation  Date/Time:  Thursday January 11 2021 15:59:50 EST Ventricular Rate:  70 PR Interval:    QRS Duration: 126 QT Interval:  348 QTC Calculation: 376 R Axis:   37 Text Interpretation: Sinus rhythm Borderline prolonged PR interval IVCD, consider atypical LBBB since last tracing no significant change Confirmed by Eber HongMiller, Rayne Cowdrey ([id number]) on 01/11/2021 4:13:04 PM   Radiology DG Chest Portable 1 View  Result Date: 01/11/2021 CLINICAL DATA:  Chest  pain and tightness.  Recent stent placement. EXAM: PORTABLE CHEST 1 VIEW COMPARISON:  01/04/2021 FINDINGS: Cardiomegaly with left ventricular prominence as seen previously. Pacemaker defibrillator in place. Areas of linear pulmonary scarring as seen previously. No evidence of acute edema. No infiltrate, lobar collapse or effusion. No acute bone finding. IMPRESSION: No active disease. Cardiomegaly. Linear pulmonary scarring. Pacemaker defibrillator. Electronically Signed   By: Paulina Fusi M.D.   On: 01/11/2021  16:19    Procedures .Critical Care Performed by: Eber Hong, MD Authorized by: Eber Hong, MD   Critical care provider statement:    Critical care time (minutes):  35   Critical care time was exclusive of:  Separately billable procedures and treating other patients and teaching time   Critical care was necessary to treat or prevent imminent or life-threatening deterioration of the following conditions:  Cardiac failure   Critical care was time spent personally by me on the following activities:  Blood draw for specimens, development of treatment plan with patient or surrogate, discussions with consultants, evaluation of patient's response to treatment, examination of patient, obtaining history from patient or surrogate, ordering and performing treatments and interventions, ordering and review of laboratory studies, ordering and review of radiographic studies, pulse oximetry, re-evaluation of patient's condition and review of old charts   (including critical care time)  Medications Ordered in ED Medications - No data to display  ED Course  I have reviewed the triage vital signs and the nursing notes.  Pertinent labs & imaging results that were available during my care of the patient were reviewed by me and considered in my medical decision making (see chart for details).  Clinical Course as of 01/11/21 1806  Thu Jan 11, 2021  1630 Chest x-ray reviewed, pacemaker placement, no signs of acute infiltrate or pneumothorax [BM]    Clinical Course User Index [BM] Eber Hong, MD   MDM Rules/Calculators/A&P                          Vital signs reflect normal blood pressure, there is no visible arrhythmia on the monitor or her EKG, there is low voltage diffusely.  There is no significant changes on the EKG from her prior EKG though it is diffusely mildly abnormal.  We will check a troponin, INR, chest x-ray and discussed with cardiology, the level of the patient's symptoms with recent  stenting and her history of antiphospholipid syndrome suggest that with her hypercoagulable state she may have a obstruction of the stent or some other cardiac pathology that is led to her symptoms.  She is agreeable to the plan, cardiac monitoring initiated  The patient's troponin is negative, labs show a creatinine of 1.2, hemoglobin is normal, thrombocytopenia is present but seems consistent with prior values  The patient has still ongoing mild symptoms which are fluctuating, I discussed the care with the cardiologist on-call, Dr. Antoine Poche who has accepted the patient in transfer to [facility].  The discussion was such that the patient can go at the next available bed, we will continue to cycle troponins, the patient is already on Coumadin, if patient still here overnight and into the morning local cardiology consultation would be beneficial  Final Clinical Impression(s) / ED Diagnoses Final diagnoses:  Unstable angina [facility])    Rx / DC Orders ED Discharge Orders    None       Eber Hong, MD 01/11/21 1806

## 2021-01-12 ENCOUNTER — Encounter (HOSPITAL_COMMUNITY): Payer: Self-pay | Admitting: Cardiology

## 2021-01-12 DIAGNOSIS — I110 Hypertensive heart disease with heart failure: Secondary | ICD-10-CM | POA: Diagnosis not present

## 2021-01-12 DIAGNOSIS — I480 Paroxysmal atrial fibrillation: Secondary | ICD-10-CM | POA: Diagnosis not present

## 2021-01-12 DIAGNOSIS — I5042 Chronic combined systolic (congestive) and diastolic (congestive) heart failure: Secondary | ICD-10-CM | POA: Diagnosis not present

## 2021-01-12 DIAGNOSIS — I2510 Atherosclerotic heart disease of native coronary artery without angina pectoris: Secondary | ICD-10-CM

## 2021-01-12 DIAGNOSIS — Z7901 Long term (current) use of anticoagulants: Secondary | ICD-10-CM | POA: Diagnosis not present

## 2021-01-12 DIAGNOSIS — I200 Unstable angina: Secondary | ICD-10-CM | POA: Diagnosis not present

## 2021-01-12 DIAGNOSIS — E119 Type 2 diabetes mellitus without complications: Secondary | ICD-10-CM | POA: Diagnosis not present

## 2021-01-12 DIAGNOSIS — I472 Ventricular tachycardia: Secondary | ICD-10-CM | POA: Diagnosis not present

## 2021-01-12 DIAGNOSIS — Z20822 Contact with and (suspected) exposure to covid-19: Secondary | ICD-10-CM | POA: Diagnosis not present

## 2021-01-12 DIAGNOSIS — I429 Cardiomyopathy, unspecified: Secondary | ICD-10-CM | POA: Diagnosis not present

## 2021-01-12 DIAGNOSIS — Z79899 Other long term (current) drug therapy: Secondary | ICD-10-CM | POA: Diagnosis not present

## 2021-01-12 DIAGNOSIS — R0789 Other chest pain: Secondary | ICD-10-CM | POA: Diagnosis present

## 2021-01-12 DIAGNOSIS — I4819 Other persistent atrial fibrillation: Secondary | ICD-10-CM | POA: Diagnosis not present

## 2021-01-12 DIAGNOSIS — F17210 Nicotine dependence, cigarettes, uncomplicated: Secondary | ICD-10-CM | POA: Diagnosis not present

## 2021-01-12 LAB — CBC
HCT: 37.6 % (ref 36.0–46.0)
Hemoglobin: 12 g/dL (ref 12.0–15.0)
MCH: 30.2 pg (ref 26.0–34.0)
MCHC: 31.9 g/dL (ref 30.0–36.0)
MCV: 94.7 fL (ref 80.0–100.0)
Platelets: 58 10*3/uL — ABNORMAL LOW (ref 150–400)
RBC: 3.97 MIL/uL (ref 3.87–5.11)
RDW: 13.7 % (ref 11.5–15.5)
WBC: 8.3 10*3/uL (ref 4.0–10.5)
nRBC: 0 % (ref 0.0–0.2)

## 2021-01-12 LAB — PROTIME-INR
INR: 2.3 — ABNORMAL HIGH (ref 0.8–1.2)
Prothrombin Time: 24.4 seconds — ABNORMAL HIGH (ref 11.4–15.2)

## 2021-01-12 LAB — BASIC METABOLIC PANEL
Anion gap: 12 (ref 5–15)
BUN: 13 mg/dL (ref 6–20)
CO2: 27 mmol/L (ref 22–32)
Calcium: 8.6 mg/dL — ABNORMAL LOW (ref 8.9–10.3)
Chloride: 96 mmol/L — ABNORMAL LOW (ref 98–111)
Creatinine, Ser: 0.94 mg/dL (ref 0.44–1.00)
GFR, Estimated: >60 mL/min (ref >60)
Glucose, Bld: 85 mg/dL (ref 70–99)
Potassium: 3.3 mmol/L — ABNORMAL LOW (ref 3.5–5.1)
Sodium: 135 mmol/L (ref 135–145)

## 2021-01-12 LAB — TROPONIN I (HIGH SENSITIVITY)
Troponin I (High Sensitivity): 15 ng/L (ref <18)
Troponin I (High Sensitivity): 18 ng/L — ABNORMAL HIGH (ref <18)
Troponin I (High Sensitivity): 18 ng/L — ABNORMAL HIGH (ref <18)

## 2021-01-12 MED ORDER — WARFARIN SODIUM 10 MG PO TABS: 10.0000 mg | ORAL_TABLET | ORAL | Status: DC

## 2021-01-12 MED ORDER — WARFARIN SODIUM 5 MG PO TABS: 5.0000 mg | ORAL_TABLET | ORAL | Status: DC

## 2021-01-12 MED ORDER — AMIODARONE HCL 200 MG PO TABS: 400.0000 mg | ORAL_TABLET | Freq: Two times a day (BID) | ORAL | Status: DC

## 2021-01-12 MED ORDER — ENOXAPARIN SODIUM 100 MG/ML ~~LOC~~ SOLN: 100.0000 mg | Freq: Two times a day (BID) | SUBCUTANEOUS | Status: DC

## 2021-01-12 MED ORDER — POTASSIUM CHLORIDE CRYS ER 20 MEQ PO TBCR: 40.0000 meq | EXTENDED_RELEASE_TABLET | Freq: Every day | ORAL | 1 refills | Status: DC

## 2021-01-12 MED ORDER — ACETAMINOPHEN 325 MG PO TABS: 650.0000 mg | ORAL_TABLET | ORAL | Status: DC | PRN

## 2021-01-12 MED ORDER — EMPAGLIFLOZIN 10 MG PO TABS
10.0000 mg | ORAL_TABLET | Freq: Every day | ORAL | Status: DC
  Administered 2021-01-12: 10 mg via ORAL
  Filled 2021-01-12: qty 1

## 2021-01-12 MED ORDER — POTASSIUM CHLORIDE CRYS ER 20 MEQ PO TBCR: 40.0000 meq | EXTENDED_RELEASE_TABLET | Freq: Every day | ORAL | Status: DC

## 2021-01-12 MED ORDER — SACUBITRIL-VALSARTAN 24-26 MG PO TABS
1.0000 | ORAL_TABLET | Freq: Two times a day (BID) | ORAL | Status: DC
  Administered 2021-01-12 (×2): 1 via ORAL
  Filled 2021-01-12 (×2): qty 1

## 2021-01-12 MED ORDER — POTASSIUM CHLORIDE CRYS ER 20 MEQ PO TBCR
20.0000 meq | EXTENDED_RELEASE_TABLET | Freq: Every day | ORAL | Status: DC
  Administered 2021-01-12: 20 meq via ORAL
  Filled 2021-01-12: qty 1

## 2021-01-12 MED ORDER — SPIRONOLACTONE 25 MG PO TABS
25.0000 mg | ORAL_TABLET | Freq: Every day | ORAL | Status: DC
  Administered 2021-01-12: 25 mg via ORAL
  Filled 2021-01-12: qty 1

## 2021-01-12 MED ORDER — FAMOTIDINE 20 MG PO TABS
20.0000 mg | ORAL_TABLET | Freq: Two times a day (BID) | ORAL | Status: DC
  Administered 2021-01-12: 20 mg via ORAL
  Filled 2021-01-12: qty 1

## 2021-01-12 MED ORDER — DIGOXIN 0.0625 MG HALF TABLET
0.0625 mg | ORAL_TABLET | Freq: Every day | ORAL | Status: DC
  Administered 2021-01-12: 0.0625 mg via ORAL
  Filled 2021-01-12: qty 1

## 2021-01-12 MED ORDER — CARVEDILOL 6.25 MG PO TABS
6.2500 mg | ORAL_TABLET | Freq: Two times a day (BID) | ORAL | Status: DC
  Administered 2021-01-12: 6.25 mg via ORAL
  Filled 2021-01-12: qty 1

## 2021-01-12 MED ORDER — ALPRAZOLAM 0.25 MG PO TABS
0.2500 mg | ORAL_TABLET | Freq: Two times a day (BID) | ORAL | Status: DC | PRN
  Administered 2021-01-12: 0.25 mg via ORAL
  Filled 2021-01-12: qty 1

## 2021-01-12 MED ORDER — OXYCODONE HCL 5 MG PO TABS
5.0000 mg | ORAL_TABLET | ORAL | Status: DC | PRN
  Administered 2021-01-12 (×2): 5 mg via ORAL
  Filled 2021-01-12 (×3): qty 1

## 2021-01-12 MED ORDER — AMIODARONE HCL 200 MG PO TABS: 200.0000 mg | ORAL_TABLET | Freq: Every day | ORAL | Status: DC

## 2021-01-12 MED ORDER — NITROGLYCERIN 0.4 MG SL SUBL: 0.4000 mg | SUBLINGUAL_TABLET | SUBLINGUAL | Status: DC | PRN

## 2021-01-12 MED ORDER — DULOXETINE HCL 60 MG PO CPEP: 60.0000 mg | ORAL_CAPSULE | Freq: Every evening | ORAL | Status: DC

## 2021-01-12 MED ORDER — GABAPENTIN 300 MG PO CAPS
300.0000 mg | ORAL_CAPSULE | Freq: Every day | ORAL | Status: DC
  Administered 2021-01-12: 300 mg via ORAL
  Filled 2021-01-12: qty 1

## 2021-01-12 MED ORDER — OXYCODONE-ACETAMINOPHEN 10-325 MG PO TABS: 1.0000 | ORAL_TABLET | ORAL | Status: DC | PRN

## 2021-01-12 MED ORDER — MEXILETINE HCL 150 MG PO CAPS
150.0000 mg | ORAL_CAPSULE | Freq: Three times a day (TID) | ORAL | Status: DC
  Administered 2021-01-12: 150 mg via ORAL
  Filled 2021-01-12 (×3): qty 1

## 2021-01-12 MED ORDER — ASPIRIN EC 81 MG PO TBEC: 81.0000 mg | DELAYED_RELEASE_TABLET | Freq: Every day | ORAL | Status: DC

## 2021-01-12 MED ORDER — WARFARIN - PHYSICIAN DOSING INPATIENT: Freq: Every day | Status: DC

## 2021-01-12 MED ORDER — GABAPENTIN 300 MG PO CAPS
600.0000 mg | ORAL_CAPSULE | Freq: Every day | ORAL | Status: DC
  Administered 2021-01-12: 600 mg via ORAL
  Filled 2021-01-12: qty 2

## 2021-01-12 MED ORDER — AMIODARONE HCL 200 MG PO TABS
400.0000 mg | ORAL_TABLET | Freq: Two times a day (BID) | ORAL | Status: DC
  Administered 2021-01-12 (×2): 400 mg via ORAL
  Filled 2021-01-12 (×2): qty 2

## 2021-01-12 MED ORDER — ZOLPIDEM TARTRATE 5 MG PO TABS: 5.0000 mg | ORAL_TABLET | Freq: Every evening | ORAL | Status: DC | PRN

## 2021-01-12 MED ORDER — GABAPENTIN 300 MG PO CAPS: 300.0000 mg | ORAL_CAPSULE | ORAL | Status: DC

## 2021-01-12 MED ORDER — AMIODARONE HCL 200 MG PO TABS: 400.0000 mg | ORAL_TABLET | Freq: Every day | ORAL | Status: DC

## 2021-01-12 MED ORDER — TORSEMIDE 20 MG PO TABS
80.0000 mg | ORAL_TABLET | Freq: Every day | ORAL | Status: DC
Start: 2021-01-12 — End: 2021-01-12
  Administered 2021-01-12: 80 mg via ORAL
  Filled 2021-01-12: qty 4

## 2021-01-12 MED ORDER — CLOPIDOGREL BISULFATE 75 MG PO TABS
75.0000 mg | ORAL_TABLET | Freq: Every day | ORAL | Status: DC
  Administered 2021-01-12: 75 mg via ORAL
  Filled 2021-01-12: qty 1

## 2021-01-12 MED ORDER — DULOXETINE HCL 30 MG PO CPEP
30.0000 mg | ORAL_CAPSULE | Freq: Every day | ORAL | Status: DC
Start: 2021-01-12 — End: 2021-01-12
  Administered 2021-01-12: 30 mg via ORAL
  Filled 2021-01-12: qty 1

## 2021-01-12 MED ORDER — OXYCODONE-ACETAMINOPHEN 5-325 MG PO TABS
1.0000 | ORAL_TABLET | ORAL | Status: DC | PRN
  Administered 2021-01-12 (×2): 1 via ORAL
  Filled 2021-01-12 (×3): qty 1

## 2021-01-12 MED ORDER — EZETIMIBE 10 MG PO TABS
10.0000 mg | ORAL_TABLET | Freq: Every day | ORAL | Status: DC
Start: 2021-01-12 — End: 2021-01-12
  Administered 2021-01-12: 10 mg via ORAL
  Filled 2021-01-12: qty 1

## 2021-01-12 MED ORDER — ONDANSETRON HCL 4 MG/2ML IJ SOLN: 4.0000 mg | Freq: Four times a day (QID) | INTRAMUSCULAR | Status: DC | PRN

## 2021-01-12 NOTE — Progress Notes (Addendum)
Advanced Heart Failure Rounding Note  PCP-Cardiologist: Nona Dell, MD   Subjective:    Wants to go home. She thinks she has some anxiety. Her Mom lives with her is on Hospice and will likely pass soon.   Denies SOB/chest pain.   Objective:   Weight Range: 92.5 kg Body mass index is 32.93 kg/m.   Vital Signs:   Temp:  [97.6 F (36.4 C)-98.2 F (36.8 C)] 97.7 F (36.5 C) (01/14 1109) Pulse Rate:  [52-76] 56 (01/14 1109) Resp:  [12-34] 20 (01/14 1109) BP: (102-136)/(41-89) 102/41 (01/14 1109) SpO2:  [91 %-98 %] 94 % (01/14 1109) Weight:  [92.5 kg] 92.5 kg (01/13 1551)    Weight change: Filed Weights   01/11/21 1551  Weight: 92.5 kg    Intake/Output:   Intake/Output Summary (Last 24 hours) at 01/12/2021 1222 Last data filed at 01/12/2021 1214 Gross per 24 hour  Intake 597 ml  Output --  Net 597 ml      Physical Exam    General:  Well appearing. No resp difficulty HEENT: Normal Neck: Supple. JVP flat  Carotids 2+ bilat; no bruits. No lymphadenopathy or thyromegaly appreciated. Cor: PMI nondisplaced. Regular rate & rhythm. No rubs, gallops or murmurs. Lungs: Clear Abdomen: Soft, nontender, nondistended. No hepatosplenomegaly. No bruits or masses. Good bowel sounds. Extremities: No cyanosis, clubbing, rash, edema Neuro: Alert & orientedx3, cranial nerves grossly intact. moves all 4 extremities w/o difficulty. Affect pleasant   Telemetry   SR with PVCs 50-60s   EKG    n/a  Labs    CBC Recent Labs    01/11/21 1556 01/12/21 0219  WBC 8.1 8.3  HGB 13.2 12.0  HCT 42.2 37.6  MCV 97.7 94.7  PLT 67* 58*   Basic Metabolic Panel Recent Labs    16/60/63 1556 01/12/21 0411  NA 131* 135  K 3.7 3.3*  CL 93* 96*  CO2 27 27  GLUCOSE 105* 85  BUN 18 13  CREATININE 1.20* 0.94  CALCIUM 8.6* 8.6*   Liver Function Tests No results for input(s): AST, ALT, ALKPHOS, BILITOT, PROT, ALBUMIN in the last 72 hours. No results for input(s): LIPASE,  AMYLASE in the last 72 hours. Cardiac Enzymes No results for input(s): CKTOTAL, CKMB, CKMBINDEX, TROPONINI in the last 72 hours.  BNP: BNP (last 3 results) Recent Labs    08/11/20 0230 12/14/20 1921  BNP 571.0* 209.0*    ProBNP (last 3 results) No results for input(s): PROBNP in the last 8760 hours.   D-Dimer No results for input(s): DDIMER in the last 72 hours. Hemoglobin A1C No results for input(s): HGBA1C in the last 72 hours. Fasting Lipid Panel No results for input(s): CHOL, HDL, LDLCALC, TRIG, CHOLHDL, LDLDIRECT in the last 72 hours. Thyroid Function Tests No results for input(s): TSH, T4TOTAL, T3FREE, THYROIDAB in the last 72 hours.  Invalid input(s): FREET3  Other results:   Imaging    DG Chest Portable 1 View  Result Date: 01/11/2021 CLINICAL DATA:  Chest pain and tightness.  Recent stent placement. EXAM: PORTABLE CHEST 1 VIEW COMPARISON:  01/04/2021 FINDINGS: Cardiomegaly with left ventricular prominence as seen previously. Pacemaker defibrillator in place. Areas of linear pulmonary scarring as seen previously. No evidence of acute edema. No infiltrate, lobar collapse or effusion. No acute bone finding. IMPRESSION: No active disease. Cardiomegaly. Linear pulmonary scarring. Pacemaker defibrillator. Electronically Signed   By: Paulina Fusi M.D.   On: 01/11/2021 16:19      Medications:  Scheduled Medications: . amiodarone  400 mg Oral BID   Followed by  . [START ON 01/19/2021] amiodarone  400 mg Oral Daily   Followed by  . [START ON 02/02/2021] amiodarone  200 mg Oral Daily  . [START ON 01/13/2021] aspirin EC  81 mg Oral Daily  . carvedilol  6.25 mg Oral BID WC  . clopidogrel  75 mg Oral Q breakfast  . digoxin  0.0625 mg Oral Daily  . DULoxetine  30 mg Oral Daily  . DULoxetine  60 mg Oral QPM  . empagliflozin  10 mg Oral QAC breakfast  . ezetimibe  10 mg Oral Daily  . famotidine  20 mg Oral BID  . gabapentin  300 mg Oral Daily   And  . gabapentin   600 mg Oral QHS  . mexiletine  150 mg Oral Q8H  . [START ON 01/13/2021] potassium chloride SA  40 mEq Oral Daily  . sacubitril-valsartan  1 tablet Oral BID  . spironolactone  25 mg Oral Daily  . torsemide  80 mg Oral Daily  . warfarin  5 mg Oral Once per day on Mon Tue Wed Fri Sat   And  . [START ON 01/14/2021] warfarin  10 mg Oral Once per day on Sun Thu  . Warfarin - Physician Dosing Inpatient   Does not apply q1600     Infusions:   PRN Medications:  acetaminophen, ALPRAZolam, nitroGLYCERIN, ondansetron (ZOFRAN) IV, oxyCODONE-acetaminophen **AND** oxyCODONE, zolpidem     Assessment/Plan   1. Chest Pain  H/O CAD MI in 2008 in [facility], cath at the time showed totally occluded diagonal, totally occluded LCX, and diffuse LAD disease. No intervention. LHC was done in 12/21 due to VT, this showed chronically occluded D1 and 95% proximal LCx.  HS Trop - negative x3.  Continue zetia. Intolerant statin.  Continue plavix 6 months  Continue aspirin 81 mg daily   2 H/O VT -Suspect scar-mediated in 12/21 with ICD discharge and syncope.Again with VT 01/03/21 with ICD discharge, did not pass out. Troponin not elevated.  - No driving x 6 months.   ICD interrogated. No arrhythmias since 01/03/21 K 3.3 Will increase potassium to 40 meq daily.   3. Chronic systolic CHF: Ischemic cardiomyopathy. [employer] ICD. Echo (8/21)with EF 15-20%, anteroseptal AK, RV normal, severe LAE, mild MR. TEE (12/21) with EF 25-30%. RHC in 12/21 with preserved cardiac output and optimized filling pressures. Has [employer] ICD. - stable from HF perspective.  Continue current HF meds. No change except will increase potassium   Plan follow up with Dr Shirlee Latch has been set up.    Length of Stay: 0  Tonye Becket, NP  01/12/2021, 12:22 PM  Advanced Heart Failure Team Pager [phone] (M-F; 7a - 4p)  Please contact [facility] for night-coverage after hours (4p -7a ) and weekends on  [email or web address]  Patient seen and examined with the above-signed Advanced Practice Provider and/or Housestaff. I personally reviewed laboratory data, imaging studies and relevant notes. I independently examined the patient and formulated the important aspects of the plan. I have edited the note to reflect any of my changes or salient points. I have personally discussed the plan with the patient and/or family.  CP resolved. Troponins & ECG negative. Volume status ok. No VT on device interrogation (viewed personally).   General:  Well appearing. No resp difficulty HEENT: normal Neck: supple. no JVD. Carotids 2+ bilat; no bruits. No lymphadenopathy or thryomegaly appreciated. Cor: PMI nondisplaced. Regular rate & rhythm.  No rubs, gallops or murmurs. Lungs: clear Abdomen: soft, nontender, nondistended. No hepatosplenomegaly. No bruits or masses. Good bowel sounds. Extremities: no cyanosis, clubbing, rash, edema Neuro: alert & orientedx3, cranial nerves grossly intact. moves all 4 extremities w/o difficulty. Affect pleasant  She is stable. Doubt stent occlusion. She wants to go home due to her mother's illness and possible imminent death. She is OK for d/c from my standpoint.  [name], MD  1:14 PM

## 2021-01-12 NOTE — Progress Notes (Signed)
D/C instructions given and reviewed. No questions asked but encouraged to call with any concerns. IV removed, tolerated well. 

## 2021-01-12 NOTE — ED Notes (Signed)
Report given to [facility]. 

## 2021-01-12 NOTE — Discharge Summary (Signed)
Advanced Heart Failure Team  Discharge Summary   Patient ID: INDIE BOEHNE MRN: [id number], DOB/AGE: 1961/10/31 60 y.o. Admit date: 01/11/2021 D/C date:     01/12/2021   Primary Discharge Diagnoses:  1. Chest Pain  2. H/O VT 2. Chronic Systolic Heart Failure  Hospital Course:   60 y.o.femalewith history of CVA, anti-phospholipid antibody syndrome, CAD, ischemic cardiomyopathy, and persistent atrial fibrillation [facility] Diona Browner for evaluation of CHF. Patient had a cath in 2008 in setting of an MI in [place]. Diagonal and LCx were totally occluded, there was diffuse LAD disease. She has a [employer] ICD. Echo in 8/21 showed EF 15-20%, anteroseptal akinesis, normal RV, severe LAE, mild MR. She was admitted in 8/21 with CHF exacerbation for 6 days.She went into atrial fibrillationin 11/21.   In 12/21, she had TEE-guided DCCV back to NS[facility]. TEE showed EF 25-30%, mildly decreased RV systolic function, moderate MR.   Later in 12/21, she was admitted with syncope and VT with ICD shock. RHC/LHC showed chronically occluded D1 and 95% proximal LCx; normal filling pressures with CI 3.0. Platelets were low, no intervention was done. Amiodarone was started.Placed on taper to continue 200 mg bid x 7 days, followed by 200 mg daily.  She had post hospital f/u w/ Dr. Shirlee Latch 12/25/20 and denied CP and no further ICD shocks but did endorse mild orthostatic symptoms, limiting med titration. Amiodaorne was reduced down to 200 mg once daily.   On1/6, device clinic received [facility] alert for 3 failed ATP and 1 ICD shockfor VT that occurred the morning of 1/5.  Given VT/ICD shock, shewas advised to go to ED. EP evaluated and felt like this was scar mediated. Admitted and amio was increased. Platelets dropped again, down to 85K. 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 No post cath complications. She will  continue Plavix at least 6 months. No ASA.   Yesterday she developed chest pain and shortness of breath. Instructed to the ED. HS Trop neg x3. Transferred to [facility] for evaluated. EKG no changes. Device interrogated today and did not show any arrhythmias. No further chest pain or shortness of breath. Able to walk down the hall without difficulty.   She will continue to be followed closely in the HF clinic. See below for detailed problem list.   1. Chest Pain  H/O CAD MI in 2008 in [facility], cath at the time showed totally occluded diagonal, totally occluded LCX, and diffuse LAD disease. No intervention. LHC was done in 12/21 due to VT, this showed chronically occluded D1 and 95% proximal LCx.  HS Trop - negative x3.  Continue zetia. Intolerant statin.  Continue plavix 6 months  Continue aspirin 81 mg daily   2 H/O VT -Suspect scar-mediated in 12/21 with ICD discharge and syncope.Again with VT 01/03/21 with ICD discharge, did not pass out. Troponin not elevated.  - No driving x 6 months.   ICD interrogated. No arrhythmias since 01/03/21 K 3.3 Will increase potassium to 40 meq daily.   3. Chronic Systolic HF Lovenox.  5. Chronic systolic CHF: Ischemic cardiomyopathy. [employer] ICD. Echo (8/21)with EF 15-20%, anteroseptal  AK, RV normal, severe LAE, mild MR. TEE (12/21) with EF 25-30%. RHC in 12/21 with preserved cardiac output and optimized filling pressures. Has [employer] ICD. - stable from HF perspective.  Continue current HF meds. No change except will increase potassium   Discharge Vitals: Blood pressure (!) 102/41, pulse (!) 56,  temperature 97.7 F (36.5 C), temperature source Oral, resp. rate 20, height 5\' 6"  (1.676 m), weight 92.5 kg, SpO2 94 %.  Labs: Lab Results  Component Value Date   WBC 8.3 01/12/2021   HGB 12.0 01/12/2021   HCT 37.6 01/12/2021   MCV 94.7 01/12/2021   PLT 58 (L) 01/12/2021    Recent Labs  Lab 01/12/21 0411  NA 135  K 3.3*  CL 96*  CO2 27  BUN 13  CREATININE 0.94  CALCIUM 8.6*  GLUCOSE 85   Lab Results  Component Value Date   CHOL 120 12/16/2020   HDL 31 (L) 12/16/2020   LDLCALC 72 12/16/2020   TRIG 85 12/16/2020   BNP (last 3 results) Recent Labs    08/11/20 0230 12/14/20 1921  BNP 571.0* 209.0*    ProBNP (last 3 results) No results for input(s): PROBNP in the last 8760 hours.   Diagnostic Studies/Procedures   DG Chest Portable 1 View  Result Date: 01/11/2021 CLINICAL DATA:  Chest pain and tightness.  Recent stent placement. EXAM: PORTABLE CHEST 1 VIEW COMPARISON:  01/04/2021 FINDINGS: Cardiomegaly with left ventricular prominence as seen previously. Pacemaker defibrillator in place. Areas of linear pulmonary scarring as seen previously. No evidence of acute edema. No infiltrate, lobar collapse or effusion. No acute bone finding. IMPRESSION: No active disease. Cardiomegaly. Linear pulmonary scarring. Pacemaker defibrillator. Electronically Signed   By: 03/04/2021 M.D.   On: 01/11/2021 16:19    Discharge Medications   Allergies as of 01/12/2021      Reactions   Beef-derived Products Anaphylaxis   From tick bite   Metoclopramide Hcl Anaphylaxis   Non responsive   Penicillins Anaphylaxis   Pt states she can take any cephalosporin (per pt. 01/01/19) Shock Has patient had a PCN reaction causing immediate rash, facial/tongue/throat swelling, SOB or lightheadedness with hypotension: Yes Has patient had a PCN reaction causing severe rash involving mucus membranes or skin necrosis: No Has patient had a PCN reaction that required hospitalization Yes Has patient had  a PCN reaction occurring within the last 10 years: No If all of the above answers are "NO", then may proceed with Cephalosporin    Pork-derived Products Anaphylaxis   From tick bite. Has tolerated heparin/lovenox   Metoprolol Other (See Comments)   Syncope   Reglan [metoclopramide]    Statins Other (See Comments)   Myalgias      Medication List    TAKE these medications   amiodarone 200 MG tablet Commonly known as: PACERONE Take 400 mg twice daily until 1/21, then reduce to 400 mg once daily until 2/4, then reduce to 200 mg daily   carvedilol 6.25 MG tablet Commonly known as: COREG Take 1 tablet (6.25 mg total) by mouth 2 (two) times daily with a meal. What changed: additional instructions   clopidogrel 75 MG tablet Commonly known as: PLAVIX Take 1 tablet (75 mg total) by mouth daily with breakfast.   Digoxin 62.5 MCG Tabs Take 0.0625 mg by mouth daily.   DULoxetine 30 MG capsule Commonly known as: CYMBALTA Take 30 mg by mouth daily.   DULoxetine 60 MG capsule Commonly known as: CYMBALTA Take 60 mg by  mouth every evening.   empagliflozin 10 MG Tabs tablet Commonly known as: Jardiance Take 1 tablet (10 mg total) by mouth daily before breakfast.   enoxaparin 100 MG/ML injection Commonly known as: LOVENOX Inject 1 mL (100 mg total) into the skin every 12 (twelve) hours for 10 days.   Entresto 24-26 MG Generic drug: sacubitril-valsartan Take 1 tablet by mouth 2 (two) times daily.   ezetimibe 10 MG tablet Commonly known as: ZETIA Take 1 tablet (10 mg total) by mouth daily.   famotidine 20 MG tablet Commonly known as: PEPCID Take 1 tablet (20 mg total) by mouth 2 (two) times daily. What changed: when to take this   fluticasone 50 MCG/ACT nasal spray Commonly known as: FLONASE Place 1 spray into both nostrils daily as needed for allergies.   gabapentin 300 MG capsule Commonly known as: NEURONTIN Take 300 mg by mouth See admin instructions. Take 300 mg  in the  AM and 600 mg  in the PM   LORazepam 0.5 MG tablet Commonly known as: ATIVAN Take 0.5 mg by mouth 3 (three) times daily as needed for anxiety.   mexiletine 150 MG capsule Commonly known as: MEXITIL Take 1 capsule (150 mg total) by mouth every 8 (eight) hours.   multivitamins ther. w/minerals Tabs tablet Take 1 tablet by mouth daily.   nitroGLYCERIN 0.4 MG SL tablet Commonly known as: NITROSTAT PLACE 1 TABLET UNDER THE TONGUE EVERY 5 MINUTES FOR 3 DOSES AS NEEDED CHEST PAIN What changed: See the new instructions.   oxyCODONE-acetaminophen 10-325 MG tablet Commonly known as: PERCOCET Take 1 tablet by mouth every 4 (four) hours as needed for pain.   potassium chloride SA 20 MEQ tablet Commonly known as: KLOR-CON Take 2 tablets (40 mEq total) by mouth daily. What changed: how much to take   promethazine 25 MG tablet Commonly known as: PHENERGAN Take 25 mg by mouth every 6 (six) hours as needed for nausea. With pain medication   spironolactone 25 MG tablet Commonly known as: ALDACTONE Take 1 tablet (25 mg total) by mouth daily.   torsemide 20 MG tablet Commonly known as: DEMADEX Take 4 tablets (80 mg total) by mouth daily. May take extra tablet as needed   warfarin 10 MG tablet Commonly known as: COUMADIN Take as directed. If you are unsure how to take this medication, talk to your nurse or doctor. Original instructions: TAKE 1 TABLET BY MOUTH EVERY DAY EXCEPT 1/2 TABLET ON MONDAYS AND THURSDAYS What changed: See the new instructions.       Disposition   The patient will be discharged in stable condition to home. Discharge Instructions    (HEART FAILURE PATIENTS) Call MD:  Anytime you have any of the following symptoms: 1) 3 pound weight gain in 24 hours or 5 pounds in 1 week 2) shortness of breath, with or without a dry hacking cough 3) swelling in the hands, feet or stomach 4) if you have to sleep on extra pillows at night in order to breathe.   Complete by: As  directed    Diet - low sodium heart healthy   Complete by: As directed    Heart Failure patients record your daily weight using the same scale at the same time of day   Complete by: As directed    Increase activity slowly   Complete by: As directed          Duration of Discharge Encounter: Greater than 35 minutes   Signed, Surafel Hilleary  NP-C  01/12/2021, 12:41 PM

## 2021-01-12 NOTE — Plan of Care (Signed)

## 2021-01-12 NOTE — TOC Progression Note (Addendum)
Transition of Care [facility]) - Progression Note    Patient Details  Name: [name] [name] MRN: [id number] Date of Birth: 1961-02-13  [facility] CM/SW Contact  Leone Haven, RN Phone Number: 01/12/2021, 11:07 AM  Clinical Narrative:    NCM spoke with patient at the bedside, from home alone, lives in a mobile home. No issues with food, no longer working due to her heart, she has no issues with transportation has not missed any MD apts due to this. She receives disability income, 1500.0 /lmonth. She has prescription coverage uses [employer] , she gets entresto thru [employer], most of her meds are under 50.00 for copays so she states she is good with that.  She has a scale at home and weighs her self daily, she also uses a pillbox. She has a PCP, she has a follow up apt which she will do virtually over the phone today. NCM gave patient the patient accounting phone number to ask about assistance in paying her hospital bill. She states her nephew will be transporting her home at discharge.  She does not have any DME at home, she is indep with ADL's. She gets Moms meals thru her insurance. She has had her flu and covid vaccines.        Expected Discharge Plan and Services                                                 Social Determinants of Health (SDOH) Interventions    Readmission Risk Interventions No flowsheet data found.

## 2021-01-12 NOTE — Progress Notes (Signed)
CARDIAC REHAB PHASE I   PRE:  Rate/Rhythm: 57 SB paced beats, PVCs  BP:  Supine: 89/44, 101/48  Sitting:   Standing:    SaO2: 96%RA  MODE:  Ambulation: 600 ft   POST:  Rate/Rhythm: 77 SR PVCS  BP:  Supine: 122/65  Sitting:   Standing:    SaO2: 97%RA 1038-1115 Pt walked 600 ft on RA with steady gait and no CP. Rhythm stayed unchanged from pre walk except HR to 77. Denied CP or SOB. Pt stated she thinks that her chest tightness that brought her to ER was more anxiety. She stated her mother has home Hospice and not doing well. Also with getting the stent last week she wanted to be cautious.  Hoping to go home today to see mother.    Luetta Nutting, RN BSN  01/12/2021 11:09 AM

## 2021-01-12 NOTE — H&P (Signed)
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 Corliss Skains, PAD(prior angio 2015 -> med rx), tobacco abuse, HLD (statin intolerant), VT (prior remote shocks for this).   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 Corliss Skains, PAD(prior angio 2015 -> med rx), tobacco abuse, HLD (statin intolerant), VT with appropriate ICD shocks on amio and mexiletine.   Shehas been followed by Dr. Diona Browner but recently established care with Dr. Jearld Pies in [facility]. She also follows with Dr. Ladona Ridgel for EP. She saw Dr. Shirlee Latch invlinic on11/30/21. She had recently gone intoAFwith worsening CHF symptoms. He arrangedfor TEE-DCCV, increased spironolactone, added Farxiga, and felt he'd eventually like to pursue repeat R/LHC to look for revascularizable CAD and assess filling pressures and cardiac output. She underwent  TEE/DCCV on 12/3 with mildly dilated LV, EF 25-30%, normal RV size with mildly decreased systolic function, moderate LAE, no LAA thrombus, mild RAE, moderate MR, mild TR ->DCCV performed with conversion to NSR.  On 12/16 she had abrupt syncope while talking to a friend. No preceding CP. She came to quickly and was transported to the [facility] ED. Int he EDBP 71/43 with repeated f/u value 105/58.K 3.7 Mg 2.3.Device interrogationwithrapid VT 261bpmcorresponding to time of syncopefollowed by ATP then shock x1 with successful conversion to NSR per d/w [employer] rep. Interrogation report also reports includes prior shock in 2017, 2018 and 02/2019. Rep also reported she had 231,000 PVCs since 12/3 (no % provided).  She was transferred to [facility] for further management. Hstrop 13 ->18 ->15 ->12 ->13. Feltokbut reportedoccasional chest pressure. Switched to heparin for possible cath given CP and VT. No further VT on IV amio.  R/LHC done 12/20.Cath results very similar to cath report from 2008 though LCx is 95% stenosed (not 100% as previously reported).Dr. Mauro Kaufmann cath with Dr. Excell Seltzer. Lcx is amenable to PCI but with dropping platelets (down to 70k)and absolute need for warfarin and ASA with anti-phospholipid syndrome and recent DC-CV will defer PCI for now particularly in light of normal hstrop. Can reconsider in January or February when farther out from [facility] if platelets rebounded.  Post cath, coumadin was resumed w/ heparin bridge. Prior cardiac meds resumed. She was monitored and had no further VT. Ectopy well suppressed w/ amio. She was transitioned off of amio gtt to PO at 400 mg bid. EP advised no driving for 6 months. She was chest pain free day of d/c, vital signs, labs and volume status stable. INR 1.1.  She was seen by Dr. Shirlee Latch on 12-25-20; amiodarone was weaned to 200mg  bid x 7 days, then she was to decrease to  200mg  daily.  Subsequent to this, cardiology was notified  last week that pt had an appropriate ICD shock. Note states "Latitude alert received for 3 failed ATP and 1 ICD shock received. Patient reports she woke up around 7 am and was watching tv on the couch during time of event. Patient reports she was unaware of shock and states she did not pass out. Reports compliance with all medications." She was admitted, and amio increased to 400mg  bid, mexiletine added to her regimen, and elective PCI done to LCx lesion. No recurrent VT noted during her hospitalization, and she was discharged on 01-09-21.  She called earlier today c/o CP and SOB; she was instructed to go to the ED for evaluation. She presented to [facility], where she was found to have normal HS trop x 3 sets, and no acute EKG changes.   Past Medical History:  Diagnosis Date  . Aneurysm of internal carotid artery   . Anticardiolipin antibody positive    Chronic Coumadin  . Brain aneurysm    followed by Dr.  . Chronic combined systolic (congestive) and diastolic (congestive) heart failure (HCC)   . Cocaine abuse in remission (HCC)   . Coronary atherosclerosis of native coronary artery    Previous invasive cardiac testing was done at a facility in [place], Corliss Skains.  Occluded diagonal, Diffuse LAD disease, Occluded Cx, and non obstructive RCA.   . Essential hypertension   . Headache   . History of pneumonia   . ICD (implantable cardioverter-defibrillator) in place   . Ischemic cardiomyopathy    LVEF 30-35%  . Mixed hyperlipidemia   . NSVT (nonsustained ventricular tachycardia) (HCC)   . Persistent atrial fibrillation (HCC)   . PVD (peripheral vascular disease) (HCC)   . Raynaud's phenomenon   . Statin intolerance   . Stroke [facility]) 2007   Total occlusion of the right internal carotid    Past Surgical History:  Procedure Laterality Date  . ABDOMINAL AORTAGRAM N/A 05/25/2014   Procedure: ABDOMINAL 2008;  Surgeon: 05/27/2014, MD;  Location: [facility] CATH LAB;  Service:  Cardiovascular;  Laterality: N/A;  . APPENDECTOMY    . CARDIAC DEFIBRILLATOR PLACEMENT     [employer] ICD  . CARDIOVERSION N/A 12/01/2020   Procedure: CARDIOVERSION;  Surgeon: Iran Ouch, MD;  Location: [facility] ENDOSCOPY;  Service: Cardiovascular;  Laterality: N/A;  . CORONARY STENT INTERVENTION N/A 01/08/2021   Procedure: CORONARY STENT INTERVENTION;  Surgeon: [facility], Peter M, MD;  Location: [facility] INVASIVE CV LAB;  Service: Cardiovascular;  Laterality: N/A;  . EP IMPLANTABLE DEVICE N/A 03/01/2016   Procedure: ICD Generator Changeout;  Surgeon: [facility], MD;  Location: [facility] INVASIVE CV LAB;  Service: Cardiovascular;  Laterality: N/A;  . L1 corpectomy   12/10  . Laryngeal polyp excision    . RIGHT/LEFT HEART CATH AND CORONARY ANGIOGRAPHY N/A 12/18/2020   Procedure: RIGHT/LEFT HEART CATH AND CORONARY ANGIOGRAPHY;  Surgeon: 14/10, MD;  Location: [facility] INVASIVE CV LAB;  Service: Cardiovascular;  Laterality: N/A;  . TEE WITHOUT CARDIOVERSION N/A 12/01/2020   Procedure: TRANSESOPHAGEAL ECHOCARDIOGRAM (TEE);  Surgeon: Dolores Patty, MD;  Location: [facility] ENDOSCOPY;  Service: Cardiovascular;  Laterality: N/A;     Medications Prior to Admission: Prior to Admission medications   Medication Sig Start Date End Date Taking? Authorizing Provider  amiodarone (PACERONE) 200 MG tablet Take 400 mg twice daily until 1/21, then reduce to 400 mg once daily until 2/4, then reduce to 200 mg daily 01/09/21  Yes  Sharol Harness, Brittainy M, PA-C  carvedilol (COREG) 6.25 MG tablet Take 1 tablet (6.25 mg total) by mouth 2 (two) times daily with a meal. Patient taking differently: Take 6.25 mg by mouth 2 (two) times daily with a meal. Take 1/2 tablet by mouth twice a day 12/19/20  Yes Robbie Lis M, PA-C  clopidogrel (PLAVIX) 75 MG tablet Take 1 tablet (75 mg total) by mouth daily with breakfast. 01/10/21  Yes Robbie Lis M, PA-C  digoxin 62.5 MCG TABS Take 0.0625 mg by mouth daily. 01/10/21  Yes Robbie Lis M, PA-C  DULoxetine (CYMBALTA) 30 MG capsule Take 30 mg by mouth daily.   Yes [provider]  DULoxetine (CYMBALTA) 60 MG capsule Take 60 mg by mouth every evening.   Yes [provider]  empagliflozin (JARDIANCE) 10 MG TABS tablet Take 1 tablet (10 mg total) by mouth daily before breakfast. 01/03/21  Yes [name], MD  enoxaparin (LOVENOX) 100 MG/ML injection Inject 1 mL (100 mg total) into the skin every 12 (twelve) hours for 10 days. 01/09/21 01/19/21 Yes [name], MD  ezetimibe (ZETIA) 10 MG tablet Take 1 tablet (10 mg total) by mouth daily. 08/16/20 11/17/21 Yes Shah, Pratik D, DO  famotidine (PEPCID) 20 MG tablet Take 1 tablet (20 mg total) by mouth 2 (two) times daily. Patient taking differently: Take 20 mg by mouth daily. 01/06/19 08/25/21 Yes Kari Baars, MD  fluticasone [facility]) 50 MCG/ACT nasal spray Place 1 spray into both nostrils daily as needed for allergies.  09/24/19  Yes [provider]  gabapentin (NEURONTIN) 300 MG capsule Take 300 mg by mouth See admin instructions. Take 300 mg  in the AM and 600 mg  in the PM   Yes [provider]  LORazepam (ATIVAN) 0.5 MG tablet Take 0.5 mg by mouth 3 (three) times daily as needed for anxiety. 01/01/21  Yes [provider]  mexiletine (MEXITIL) 150 MG capsule Take 1 capsule (150 mg total) by mouth every 8 (eight) hours. 01/09/21  Yes [name], MD  Multiple Vitamins-Minerals (MULTIVITAMINS THER. W/MINERALS) TABS Take 1 tablet by mouth daily.     Yes [provider]  nitroGLYCERIN (NITROSTAT) 0.4 MG SL tablet PLACE 1 TABLET UNDER THE TONGUE EVERY 5 MINUTES FOR 3 DOSES AS NEEDED CHEST PAIN Patient taking differently: Place 0.4 mg under the tongue every 5 (five) minutes as needed for chest pain. 05/04/19  Yes [name], MD  oxyCODONE-acetaminophen (PERCOCET) 10-325 MG tablet Take 1 tablet by mouth every 4 (four) hours as needed for pain.   Yes [provider]  potassium chloride SA (KLOR-CON) 20 MEQ tablet TAKE 1 TABLET BY MOUTH EVERY DAY Patient taking differently: Take 20 mEq by mouth daily. 08/10/20  Yes [name], MD  promethazine (PHENERGAN) 25 MG tablet Take 25 mg by mouth every 6 (six) hours as needed for nausea. With pain medication   Yes [provider]  sacubitril-valsartan (ENTRESTO) 24-26 MG Take 1 tablet by mouth 2 (two) times daily. 10/28/20  Yes Leone Brand, NP  spironolactone (ALDACTONE) 25 MG tablet Take 1 tablet (25 mg total) by mouth daily. 11/28/20  Yes [name], MD  torsemide (DEMADEX) 20 MG tablet Take 4 tablets (80 mg total) by mouth daily. May take extra tablet as needed 09/21/20 11/17/21 Yes [name], MD  warfarin (COUMADIN) 10 MG tablet TAKE 1 TABLET BY MOUTH EVERY DAY EXCEPT 1/2 TABLET ON MONDAYS AND THURSDAYS Patient taking  differently: Take 10 mg by mouth See admin instructions. Take 5 mg Mon. Wed, Friday , and Saturday All the other days take 10 mg in the evening 10/09/20  Yes Jonelle SidleMcDowell, Samuel G, MD     Allergies:    Allergies  Allergen Reactions  . Beef-Derived Products Anaphylaxis    From tick bite  . Metoclopramide Hcl Anaphylaxis    Non responsive  . Penicillins Anaphylaxis    Pt states she can take any cephalosporin (per pt. 01/01/19) Shock Has patient had a PCN reaction causing immediate rash, facial/tongue/throat swelling, SOB or lightheadedness with hypotension: Yes Has patient had a PCN reaction causing severe rash involving mucus membranes or skin necrosis: No Has patient had a PCN reaction that required hospitalization Yes Has patient had a PCN reaction occurring within the last 10 years: No If all of the above answers are "NO", then may proceed with Cephalosporin   . Pork-Derived Products Anaphylaxis    From tick bite. Has tolerated heparin/lovenox  . Metoprolol Other (See Comments)    Syncope   . Reglan [Metoclopramide]   . Statins Other (See  Comments)    Myalgias    Social History:   Social History   Socioeconomic History  . Marital status: Single    Spouse name: Not on file  . Number of children: Not on file  . Years of education: Not on file  . Highest education level: Not on file  Occupational History  . Occupation: Disabled    Comment: ESL  Tobacco Use  . Smoking status: Current Every Day Smoker    Packs/day: 1.00    Years: 43.00    Pack years: 43.00    Types: Cigarettes    Start date: 03/03/1978    Last attempt to quit: 10/03/2013    Years since quitting: 7.2  . Smokeless tobacco: Never Used  . Tobacco comment: 4 ciggs per day  Vaping Use  . Vaping Use: Every day  Substance and Sexual Activity  . Alcohol use: No    Alcohol/week: 0.0 standard drinks  . Drug use: No    Comment: Prior history of cocaine  . Sexual activity: Yes    Birth control/protection: None  Other Topics Concern  . Not on file  Social History Narrative  . Not on file   Social Determinants of Health   Financial Resource Strain: Not on file  Food Insecurity: Not on file  Transportation Needs: Not on file  Physical Activity: Not on file  Stress: Not on file  Social Connections: Not on file  Intimate Partner Violence: Not on file    Family History:   The patient's family history includes Ankylosing spondylitis in her sister; Heart attack in her brother; Heart disease (age of onset: 185) in her father; Stomach cancer (age of onset: 2158) in her brother.    ROS:  Please see the history of present illness.  All other ROS reviewed and negative.     Physical Exam/Data:   Vitals:   01/11/21 2100 01/11/21 2130 01/11/21 2200 01/11/21 2230  BP: 122/68 130/63 136/60 113/66  Pulse: (!) 55  (!) 52 (!) 57  Resp: 14 15 14 12   Temp:      TempSrc:      SpO2: 97%  95% 94%  Weight:      Height:       No intake or output data in the 24 hours ending 01/12/21 0038 Last 3 Weights 01/11/2021 01/09/2021 01/08/2021  Weight (lbs) 204 lb 208 lb  8 oz  204 lb 14.4 oz  Weight (kg) 92.534 kg 94.575 kg 92.942 kg     Body mass index is 32.93 kg/m.  General:  Well nourished, well developed, in no acute distress HEENT: normal Lymph: no adenopathy Neck: no JVD Endocrine:  No thryomegaly Vascular: No carotid bruits; DP pulses 2+ bilaterally Cardiac:  normal S1, S2; RRR; no murmur  Lungs:  clear to auscultation bilaterally, no wheezing, rhonchi or rales  Abd: soft, nontender, no hepatomegaly  Ext: no edema Musculoskeletal:  No deformities Skin: warm and dry  Neuro:  no focal abnormalities noted Psych:  Normal affect    EKG:  The ECG that was done 01-11-21 was personally reviewed and demonstrates NSR, 1st deg AVB with IVCD  Relevant CV Studies: LHC 12-18-20  1st Diag lesion is 100% stenosed.  Mid LAD lesion is 45% stenosed.  Prox Cx to Mid Cx lesion is 95% stenosed.  Prox RCA lesion is 30% stenosed.    Findings:  Ao = 125/65 (88)  LV = 127/13 RA = 1 RV = 27/4 PA = 24/6 (14) PCW = 12 Fick cardiac output/index = 6.2/3.0 PVR = 0.3 WU Ao sat = 99% PA sat = 76%, 77% High SVC sat = 80%    Assessment: 1. 2v CAD with 95% mid LCX and chronically occluded D1 2. EF 25% 3. Well compensated hemodynamics with high cardiac output.  4. No evidence of intracardiac shunting    Plan/Discussion:  Cath results very similar to cath report from 2008 though LCx is 95% stenosed (not 100% as previously reported). I reviewed cath with Dr. Excell Seltzer. Lcx is amenable to PCI but with dropping platelets and absolute need for warfarin and ASA with anti-phospholipid syndrome and recent DC-CV will defer PCI for now particularly in light of normal hstrop. Can reconsider in January or February when farther out from [facility] if platelets rebounded.   TEE 12-07-20 1. Left ventricular ejection fraction, by estimation, is 25 to 30%. The  left ventricle has severely decreased function. The left ventricle  demonstrates regional wall motion abnormalities  with severe hypokinesis to  akinesis of the anterior and septal  walls. The left ventricular internal cavity size was mildly dilated.  2. Right ventricular systolic function is mildly reduced. The right  ventricular size is normal.  3. Left atrial size was moderately dilated. No left atrial/left atrial  appendage thrombus was detected.  4. Right atrial size was mildly dilated.  5. There was moderate mitral regurgitation with ERO 0.26 cm^2 by PISA  (suspect functional MR). No evidence of mitral stenosis.  6. No PFO/ASD by color doppler.  7. The aortic valve is tricuspid. Aortic valve regurgitation is not  visualized. No aortic stenosis is present.  8. Peak RV-RA gradient 18 mmHg.  9. Normal caliber thoracic aorta with no significant plaque.   TTE 08-11-20 1. Limited visualization difficult to discern more focal wall motion  abnormalities. Compared to the Jan 06, 2020 echo there is more prominent  global hypokinesis and at least the anteroseptal wall appears akinetic. Marland Kitchen  Left ventricular ejection fraction, by  estimation, is 15-20%. The left ventricle has severely decreased  function. The left ventricle demonstrates global hypokinesis. Left  ventricular diastolic parameters are indeterminate. Elevated left atrial  pressure.  2. Right ventricular systolic function is normal. The right ventricular  size is normal.  3. Left atrial size was severely dilated.  4. The mitral valve is normal in structure. Mild mitral valve  regurgitation. No evidence of  mitral stenosis.  5. The aortic valve was not well visualized. Aortic valve regurgitation  is not visualized. No aortic stenosis is present.  6. The inferior vena cava is normal in size with greater than 50%  respiratory variability, suggesting right atrial pressure of 3 mmHg.  LHC w/ PCI 01-08-21  Prox Cx to Mid Cx lesion is 95% stenosed.  Post intervention, there is a 0% residual stenosis.  A drug-eluting stent was  successfully placed using a STENT RESOLUTE ONYX 2.25X22.   1. Successful PCI of the LCx with DES x 1   Laboratory Data:  High Sensitivity Troponin:   Recent Labs  Lab 01/04/21 2205 01/05/21 0033 01/05/21 0308 01/11/21 1556 01/11/21 1931  TROPONINIHS 17 16 17 17 15       Chemistry Recent Labs  Lab 01/09/21 0226 01/11/21 1556  NA 136 131*  K 4.1 3.7  CL 99 93*  CO2 28 27  GLUCOSE 88 105*  BUN 13 18  CREATININE 0.85 1.20*  CALCIUM 8.7* 8.6*  GFRNONAA >60 53*  ANIONGAP 9 11    No results for input(s): PROT, ALBUMIN, AST, ALT, ALKPHOS, BILITOT in the last 168 hours. Hematology Recent Labs  Lab 01/09/21 0226 01/11/21 1556  WBC 7.2 8.1  RBC 4.29 4.32  HGB 12.7 13.2  HCT 40.2 42.2  MCV 93.7 97.7  MCH 29.6 30.6  MCHC 31.6 31.3  RDW 13.5 13.8  PLT 85* 67*   BNPNo results for input(s): BNP, PROBNP in the last 168 hours.  DDimer No results for input(s): DDIMER in the last 168 hours.   Radiology/Studies:  DG Chest Portable 1 View  Result Date: 01/11/2021 CLINICAL DATA:  Chest pain and tightness.  Recent stent placement. EXAM: PORTABLE CHEST 1 VIEW COMPARISON:  01/04/2021 FINDINGS: Cardiomegaly with left ventricular prominence as seen previously. Pacemaker defibrillator in place. Areas of linear pulmonary scarring as seen previously. No evidence of acute edema. No infiltrate, lobar collapse or effusion. No acute bone finding. IMPRESSION: No active disease. Cardiomegaly. Linear pulmonary scarring. Pacemaker defibrillator. Electronically Signed   By: Paulina Fusi M.D.   On: 01/11/2021 16:19     Assessment and Plan:   1. CAD: pt s/p recent PCI to LCx done 01-08-21, now w/ recurrent CP. HS trop has been negative x 3 sets. Will cont to cycle. No acute EKG changes. Acute stent thrombosis not likely. pt on chronic coumadin; no need for heparin or lovenox at this time as INR 2.7 currently.  2. Cardiomyopathy: EF ~25-30%; currently clinically well-compensated. Cont home HF  regimen 3. AF: paroxysmal s/p recent DCCV. On amiodarone currently.  4. APLA: h/o CVA. On chronic ASA and coumadin. 5. VT: likely scar-mediated. Cont amiodarone. Can get device interrogated tomorrow but no symptomatic VT or therapy from device since hospital discharge.       HEAR Score (for undifferentiated chest pain):  HEAR Score: 4       CHA2DS2-VASc Score = 6  This indicates a 9.7% annual risk of stroke. The patient's score is based upon: CHF History: Yes HTN History: Yes Diabetes History: No Stroke History: Yes Vascular Disease History: Yes Age Score: 0 Gender Score: 1     For questions or updates, please contact [facility] Please consult [email or web address] for contact info under     Signed, Precious Reel, MD, [facility]  01/12/2021 12:38 AM

## 2021-01-16 NOTE — Telephone Encounter (Signed)
Noted. No change in treatment for now.

## 2021-01-23 ENCOUNTER — Other Ambulatory Visit: Payer: Self-pay | Admitting: Cardiology

## 2021-01-25 ENCOUNTER — Other Ambulatory Visit (HOSPITAL_COMMUNITY): Payer: Self-pay | Admitting: *Deleted

## 2021-01-25 ENCOUNTER — Other Ambulatory Visit: Payer: Self-pay | Admitting: Cardiology

## 2021-01-25 ENCOUNTER — Encounter (HOSPITAL_COMMUNITY): Payer: Self-pay

## 2021-01-25 DIAGNOSIS — Z955 Presence of coronary angioplasty implant and graft: Secondary | ICD-10-CM

## 2021-01-25 MED ORDER — AMIODARONE HCL 200 MG PO TABS: ORAL_TABLET | ORAL | 0 refills | Status: DC

## 2021-01-29 ENCOUNTER — Other Ambulatory Visit (HOSPITAL_COMMUNITY): Payer: Self-pay | Admitting: *Deleted

## 2021-01-29 DIAGNOSIS — G253 Myoclonus: Secondary | ICD-10-CM

## 2021-01-29 DIAGNOSIS — I4891 Unspecified atrial fibrillation: Secondary | ICD-10-CM

## 2021-01-30 ENCOUNTER — Encounter (HOSPITAL_COMMUNITY): Payer: Self-pay | Admitting: Cardiology

## 2021-01-30 ENCOUNTER — Ambulatory Visit (HOSPITAL_COMMUNITY)
Admission: RE | Admit: 2021-01-30 | Discharge: 2021-01-30 | Disposition: A | Discharge status: Home / Self Care | Payer: Medicare PPO | Source: Ambulatory Visit | Attending: Cardiology | Admitting: Cardiology

## 2021-01-30 ENCOUNTER — Other Ambulatory Visit: Payer: Self-pay

## 2021-01-30 VITALS — BP 128/80 | HR 64 | Wt 207.8 lb

## 2021-01-30 DIAGNOSIS — I2510 Atherosclerotic heart disease of native coronary artery without angina pectoris: Secondary | ICD-10-CM | POA: Insufficient documentation

## 2021-01-30 DIAGNOSIS — Z79899 Other long term (current) drug therapy: Secondary | ICD-10-CM | POA: Insufficient documentation

## 2021-01-30 DIAGNOSIS — R0789 Other chest pain: Secondary | ICD-10-CM | POA: Diagnosis not present

## 2021-01-30 DIAGNOSIS — D6861 Antiphospholipid syndrome: Secondary | ICD-10-CM | POA: Insufficient documentation

## 2021-01-30 DIAGNOSIS — Z8673 Personal history of transient ischemic attack (TIA), and cerebral infarction without residual deficits: Secondary | ICD-10-CM | POA: Diagnosis not present

## 2021-01-30 DIAGNOSIS — Z8249 Family history of ischemic heart disease and other diseases of the circulatory system: Secondary | ICD-10-CM | POA: Diagnosis not present

## 2021-01-30 DIAGNOSIS — I5022 Chronic systolic (congestive) heart failure: Secondary | ICD-10-CM | POA: Diagnosis not present

## 2021-01-30 DIAGNOSIS — I252 Old myocardial infarction: Secondary | ICD-10-CM | POA: Diagnosis not present

## 2021-01-30 DIAGNOSIS — D696 Thrombocytopenia, unspecified: Secondary | ICD-10-CM | POA: Insufficient documentation

## 2021-01-30 DIAGNOSIS — I110 Hypertensive heart disease with heart failure: Secondary | ICD-10-CM | POA: Diagnosis not present

## 2021-01-30 DIAGNOSIS — Z7901 Long term (current) use of anticoagulants: Secondary | ICD-10-CM | POA: Diagnosis not present

## 2021-01-30 DIAGNOSIS — F17200 Nicotine dependence, unspecified, uncomplicated: Secondary | ICD-10-CM | POA: Diagnosis not present

## 2021-01-30 DIAGNOSIS — I255 Ischemic cardiomyopathy: Secondary | ICD-10-CM | POA: Insufficient documentation

## 2021-01-30 DIAGNOSIS — I4819 Other persistent atrial fibrillation: Secondary | ICD-10-CM | POA: Diagnosis not present

## 2021-01-30 DIAGNOSIS — Z95810 Presence of automatic (implantable) cardiac defibrillator: Secondary | ICD-10-CM | POA: Diagnosis not present

## 2021-01-30 DIAGNOSIS — Z7902 Long term (current) use of antithrombotics/antiplatelets: Secondary | ICD-10-CM | POA: Diagnosis not present

## 2021-01-30 LAB — COMPREHENSIVE METABOLIC PANEL
ALT: 25 U/L (ref 0–44)
AST: 24 U/L (ref 15–41)
Albumin: 3.6 g/dL (ref 3.5–5.0)
Alkaline Phosphatase: 88 U/L (ref 38–126)
Anion gap: 11 (ref 5–15)
BUN: 11 mg/dL (ref 6–20)
CO2: 30 mmol/L (ref 22–32)
Calcium: 9 mg/dL (ref 8.9–10.3)
Chloride: 93 mmol/L — ABNORMAL LOW (ref 98–111)
Creatinine, Ser: 0.96 mg/dL (ref 0.44–1.00)
GFR, Estimated: >60 mL/min (ref >60)
Glucose, Bld: 88 mg/dL (ref 70–99)
Potassium: 4.7 mmol/L (ref 3.5–5.1)
Sodium: 134 mmol/L — ABNORMAL LOW (ref 135–145)
Total Bilirubin: 0.4 mg/dL (ref 0.3–1.2)
Total Protein: 7.4 g/dL (ref 6.5–8.1)

## 2021-01-30 LAB — CBC
HCT: 44.6 % (ref 36.0–46.0)
Hemoglobin: 14.2 g/dL (ref 12.0–15.0)
MCH: 30 pg (ref 26.0–34.0)
MCHC: 31.8 g/dL (ref 30.0–36.0)
MCV: 94.3 fL (ref 80.0–100.0)
Platelets: 171 10*3/uL (ref 150–400)
RBC: 4.73 MIL/uL (ref 3.87–5.11)
RDW: 13.9 % (ref 11.5–15.5)
WBC: 9.5 10*3/uL (ref 4.0–10.5)
nRBC: 0 % (ref 0.0–0.2)

## 2021-01-30 LAB — DIGOXIN LEVEL: Digoxin Level: 0.5 ng/mL — ABNORMAL LOW (ref 0.8–2.0)

## 2021-01-30 LAB — TSH: TSH: 3.986 u[IU]/mL (ref 0.350–4.500)

## 2021-01-30 MED ORDER — ENTRESTO 49-51 MG PO TABS: 1.0000 | ORAL_TABLET | Freq: Two times a day (BID) | ORAL | 3 refills | Status: DC

## 2021-01-30 MED ORDER — AMIODARONE HCL 200 MG PO TABS: 200.0000 mg | ORAL_TABLET | Freq: Every day | ORAL | 3 refills | Status: DC

## 2021-01-30 NOTE — Progress Notes (Signed)
EKG CRITICAL VALUE     12 lead EKG performed.  Critical value noted.  Dalton McClean,MD notified.   Harle Battiest Zamarion Longest, CCT 01/30/2021 2:23 PM

## 2021-01-30 NOTE — Patient Instructions (Signed)
Labs done today. We will contact you only if your labs are abnormal.  INCREASE Entresto to 49-51mg  (1 tablet) by mouth 2 times daily  DECREASE Amiodarone 200mg  (1 tablet) by mouth daily.  No other medication changes were made. Please continue all current medications as prescribed.  Your physician recommends that you schedule a follow-up appointment in: 10 days for a lab only appointment at [facility] and in 1 month with our APP Clinic.  You have been referred to [facility]. They will contact you to schedule an appointment.  You have been referred to [facility] in [place], they will contact you to schedule an appointment.    If you have any questions or concerns before your next appointment please send [place] a message through [place] or call our office at [phone].    TO LEAVE A MESSAGE FOR THE NURSE SELECT OPTION 2, PLEASE LEAVE A MESSAGE INCLUDING: . YOUR NAME . DATE OF BIRTH . CALL BACK NUMBER . REASON FOR CALL**this is important as we prioritize the call backs  YOU WILL RECEIVE A CALL BACK THE SAME DAY AS LONG AS YOU CALL BEFORE 4:00 PM   Do the following things EVERYDAY: 1) Weigh yourself in the morning before breakfast. Write it down and keep it in a log. 2) Take your medicines as prescribed 3) Eat low salt foods-Limit salt (sodium) to 2000 mg per day.  4) Stay as active as you can everyday 5) Limit all fluids for the day to less than 2 liters   At the [facility], you and your health needs are our priority. As part of our continuing mission to provide you with exceptional heart care, we have created designated Provider Care Teams. These Care Teams include your primary Cardiologist (physician) and Advanced Practice Providers (APPs- Physician Assistants and Nurse Practitioners) who all work together to provide you with the care you need, when you need it.   You may see any of the following providers on your designated Care Team at  your next follow up: [phone] Dr Marland Kitchen . Dr [name] . [name], NP . Tonye Becket, PA . Robbie Lis, PharmD   Please be sure to bring in all your medications bottles to every appointment.

## 2021-01-31 ENCOUNTER — Other Ambulatory Visit (HOSPITAL_COMMUNITY): Payer: Medicare PPO

## 2021-01-31 NOTE — Progress Notes (Signed)
PCP: Ignatius Specking, MD Cardiology: Dr. Diona Browner HF Cardiology: Dr. Shirlee Latch  60 y.o. with history of CVA, anti-phospholipid antibody syndrome, CAD, ischemic cardiomyopathy, and persistent atrial fibrillation was referred by Dr. Diona Browner for evaluation of CHF. Patient had a cath in 2008 in setting of an MI in [place].  Diagonal and LCx were totally occluded, there was diffuse LAD disease.  She has not had a cath since that time. She has a Environmental manager ICD.  Echo in 8/21 showed EF 15-20%, anteroseptal akinesis, normal RV, severe LAE, mild MR. She was admitted in 8/21 with CHF exacerbation for 6 days.  She went into atrial fibrillation in 11/21.   In 12/21, she had TEE-guided DCCV back to NSR.  TEE showed EF 25-30%, mildly decreased RV systolic function, moderate MR.    Later in 12/21, she was admitted with syncope and VT with ICD shock.  RHC/LHC showed chronically occluded D1 and 95% proximal LCx; normal filling pressures with CI 3.0. Platelets were low, no intervention was done. Amiodarone was started.   In 1/22, she was admitted again with VT and presyncope, HS-TnI was negative.  She had PCI to LCx this admission.  She was started on mexiletine in addition to amiodarone.   She was readmitted later in 1/22 with chest pain, HS-TnI was negative and she was discharged.   She returns for followup of CHF and CAD.  She has had episodes of chest tightness generally when she wakes up from sleep.  Pain will last for about 5 minutes and resolve on its own. She will have an episode every 2-3 days.  No exertional symptoms.  ECG today showed 1 mm lateral STE, prior ECGs showed wider QRS in LBBB pattern with some ST elevation in the lateral leads. No chest pain currently.  No dyspnea walking on flat ground.  No palpitations or lightheadedness.  No orthopnea/PND.  Biggest problem currently is bilateral knee pain.  She has also noted involuntary leg jerking with amiodarone, this has been bothersome and has seemed to be  dose-dependent.  She continues to smoke.   ECG (personally reviewed): NSR, 1 mm lateral STE (no longer has LBBB).    Labs (8/21): K 4.4, creatinine 0.58, LDL 94  Labs (12/21): LDL 72, HDL 31, TGs 85, K 4.4, creatinine 0.8, hgb 14.6, plts 70K Labs (1/22): K 3.3, creatinine 0.94, plts 58K  PMH: 1. Brain aneurysm: Followed by Dr. Corliss Skains.  2. HTN 3. CVA: Total occlusion of RICA.  - Carotid dopplers (12/21): Occluded RICA, < 50% stenosis LICA.  4. Anti-phospholipid antibody syndrome: Diagnosed at [facility].  - On warfarin and ASA 81 given history of CVA.  5. Remote cocaine abuse.  6. PAD: peripheral arterial dopplers in 3/21 with early left-sided iliac disease, right anterior tibial disease; ABI 1.0 on right and 0.75 on left.  7. Active smoker.  8. L-spine arthritis.  9. CAD: MI in 2008 in [facility], cath at the time showed totally occluded diagonal, totally occluded LCX, and diffuse LAD disease.  No intervention.   - LHC (12/21): chronically occluded D1, 45% mid LAD, 95% proximal LCx.  - PCI to proximal LCx in 1/22.  10. Atrial fibrillation: Persistent, started in 11/21.  - TEE-DCCV back to NSR in 12/21.  11. Chronic systolic CHF: Ischemic cardiomyopathy.  [employer] ICD.  - Echo (8/21): EF 15-20%, anteroseptal AK, RV normal, severe LAE, mild MR.  - TEE (12/21): EF 25-30%, mildly decreased RV systolic function, moderate MR with ERO 0.26 cm^2 (probably  functional).   - RHC (12/21): mean RA 1, PA 24/6, mean PCWP 12, CI 3.0 12. Hyperlipidemia: Statin intolerant.  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   14. Mitral regurgitation: Moderate on 12/21 TEE.  15. Ventricular tachycardia  FH: Father, grandfather with MIs.  Brother with MI in his 51s.   SH: Lives in [place], on disability, smokes 4 cigs/day.  Prior cocaine.  No ETOH.   ROS: All systems reviewed and negative except as per HPI.   Current Outpatient Medications  Medication Sig Dispense Refill  . carvedilol (COREG) 6.25 MG  tablet Take 1 tablet (6.25 mg total) by mouth 2 (two) times daily with a meal. 60 tablet 5  . clopidogrel (PLAVIX) 75 MG tablet Take 1 tablet (75 mg total) by mouth daily with breakfast. 30 tablet 11  . digoxin 62.5 MCG TABS Take 0.0625 mg by mouth daily. 30 tablet 5  . DULoxetine (CYMBALTA) 30 MG capsule Take 30 mg by mouth daily.    . DULoxetine (CYMBALTA) 60 MG capsule Take 60 mg by mouth every evening.    . empagliflozin (JARDIANCE) 10 MG TABS tablet Take 1 tablet (10 mg total) by mouth daily before breakfast. 30 tablet 3  . ezetimibe (ZETIA) 10 MG tablet Take 1 tablet (10 mg total) by mouth daily. 30 tablet 3  . famotidine (PEPCID) 20 MG tablet Take 1 tablet (20 mg total) by mouth 2 (two) times daily. 60 tablet 1  . fluticasone (FLONASE) 50 MCG/ACT nasal spray Place 1 spray into both nostrils daily as needed for allergies.     Marland Kitchen gabapentin (NEURONTIN) 300 MG capsule Take 300 mg by mouth See admin instructions. Take 300 mg  in the AM and 600 mg  in the PM    . LORazepam (ATIVAN) 0.5 MG tablet Take 0.5 mg by mouth 3 (three) times daily as needed for anxiety.    . mexiletine (MEXITIL) 150 MG capsule Take 1 capsule (150 mg total) by mouth every 8 (eight) hours. 90 capsule 5  . Multiple Vitamins-Minerals (MULTIVITAMINS THER. W/MINERALS) TABS Take 1 tablet by mouth daily.      . nitroGLYCERIN (NITROSTAT) 0.4 MG SL tablet PLACE 1 TABLET UNDER THE TONGUE EVERY 5 MINUTES FOR 3 DOSES AS NEEDED CHEST PAIN 25 tablet 3  . oxyCODONE-acetaminophen (PERCOCET) 10-325 MG tablet Take 1 tablet by mouth every 4 (four) hours as needed for pain.    . potassium chloride SA (KLOR-CON) 20 MEQ tablet Take 2 tablets (40 mEq total) by mouth daily. 180 each 1  . promethazine (PHENERGAN) 25 MG tablet Take 25 mg by mouth every 6 (six) hours as needed for nausea. With pain medication    . sacubitril-valsartan (ENTRESTO) 49-51 MG Take 1 tablet by mouth 2 (two) times daily. 180 tablet 3  . spironolactone (ALDACTONE) 25 MG  tablet Take 1 tablet (25 mg total) by mouth daily. 30 tablet 3  . torsemide (DEMADEX) 20 MG tablet TAKE 4 TABLETS BY MOUTH EVERY DAY. MAY TAKE EXTRA TABLET AS NEEDED 135 tablet 3  . warfarin (COUMADIN) 10 MG tablet TAKE 1 TABLET BY MOUTH EVERY DAY EXCEPT 1/2 TABLET ON MONDAYS AND THURSDAYS (Patient taking differently: Take 10 mg by mouth See admin instructions. Take 5 mg Mon. Wed, Friday , and Saturday All the other days take 10 mg in the evening) 30 tablet 3  . amiodarone (PACERONE) 200 MG tablet Take 1 tablet (200 mg total) by mouth daily. 90 tablet 3   No current facility-administered medications for this encounter.  BP 128/80   Pulse 64   Wt 94.3 kg (207 lb 12.8 oz)   SpO2 96%   BMI 33.54 kg/m  General: NAD Neck: No JVD, no thyromegaly or thyroid nodule.  Lungs: Clear to auscultation bilaterally with normal respiratory effort. CV: Nondisplaced PMI.  Heart regular S1/S2, no S3/S4, no murmur.  No peripheral edema.  No carotid bruit.  Normal pedal pulses.  Abdomen: Soft, nontender, no hepatosplenomegaly, no distention.  Skin: Intact without lesions or rashes.  Neurologic: Alert and oriented x 3.  Psych: Normal affect. Extremities: No clubbing or cyanosis.  HEENT: Normal.   Assessment/Plan: 1. CAD: MI in 2008 in [facility], cath at the time showed totally occluded diagonal, totally occluded LCX, and diffuse LAD disease.  No intervention. LHC was done in 12/21 due to VT, this showed chronically occluded D1 and 95% proximal LCx.  Initially, no PCI due to thrombocytopenia.  She eventually had PCI to proximal LCx in 1/22.  .  - Continue Zetia, has not tolerated statins.  Good lipids in 12/21.  - Continue Plavix 75 mg daily x at least 6 months and ideally 1 year. - She is off ASA given use of warfarin and Plavix.  2. Atrial fibrillation: TEE-guided DCCV to NSR in 12/21.  She is in NSR today.   - If atrial fibrillation recurs, will need to consider Tikosyn versus ablation.  - Continue warfarin.    3. Carotid stenosis: Known RICA occlusion. Dopplers in 12/21 with <50% LICA.  - Repeat dopplers in 12/22.  4. 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  - She is on warfarin.  5. Chronic systolic CHF: Ischemic cardiomyopathy.  [employer] ICD.  Echo (8/21) with EF 15-20%, anteroseptal AK, RV normal, severe LAE, mild MR.  TEE (12/21) with EF 25-30%.  RHC in 12/21 with preserved cardiac output and optimized filling pressures.  NYHA class II symptoms, not volume overloaded on exam.   - Increase Entresto to 49/51 bid. BMET today and in 10 days.  - Continue Farxiga 10 mg daily.   - Continue spironolactone 25 mg daily.  - Continue Coreg 6.25 mg bid.  - Continue torsemide 80 mg daily, check BMET.  - Continue digoxin 0.0625 daily, check digoxin level.  6. Smoking: Active smoker, wants to stop on her own.  7. PAD: She denies claudication.  - Needs to quit smoking.  8. 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  - Repeat CBC today.  - She will see Dr. Truett Perna (hematology).  9. Mitral regurgitation: Moderate on 12/21 TEE, suspect functional.  10. VT: Suspect scar-mediated in 12/21 with ICD discharge and syncope. She had VT with presyncope in 1/22.  She is now on amiodarone + mexiletine.  - No driving x 6 months.  - She has involuntary leg jerking that seems to be related to amiodarone, worse with higher dose amiodarone.  Today, she can decrease amiodarone to 200 mg daily.  Check LFTs and TSH.  She will need regular eye exam.  - Continue mexiletine.  - I will arrange for followup with EP.   Followup with NP/PA in 1 month.    [name] 01/31/2021

## 2021-02-01 ENCOUNTER — Encounter: Payer: Medicare PPO | Admitting: Student

## 2021-02-01 NOTE — Progress Notes (Deleted)
Electrophysiology Office Note Date: 02/01/2021  ID:  [name] [name], [name] [name] 1961-02-14, MRN [id number]  PCP: Ignatius Specking, MD Primary Cardiologist: Nona Dell, MD Electrophysiologist: Lewayne Bunting, MD   CC: Routine ICD follow-up  [name] [name] is a 60 y.o. female seen today for Lewayne Bunting, MD for post hospital follow up. Admitted 12/2020 in setting of VT with shock. Thought to be due to severe CAD. Underwent cath with PCI of the LCx with DES x 1 on 01/08/21. Since discharge from hospital the patient reports doing ***.  She has had leg tremors on amiodarone. Dose decreased on 01/30/2021 by Dr. Shirlee Latch. She says it was worse on higher doses.       she denies chest pain, palpitations, dyspnea, PND, orthopnea, nausea, vomiting, dizziness, syncope, edema, weight gain, or early satiety. {He/she (caps):30048} has not had ICD shocks.   Device History: Magazine features editor ICD implanted 2008, gen change 2017 for chronic systolic CHF History of appropriate therapy: Yes History of AAD therapy: Yes; currently on amiodarone.    Past Medical History:  Diagnosis Date  . Aneurysm of internal carotid artery   . Anticardiolipin antibody positive    Chronic Coumadin  . Brain aneurysm    followed by Dr. Corliss Skains  . Chronic combined systolic (congestive) and diastolic (congestive) heart failure (HCC)   . Cocaine abuse in remission (HCC)   . Coronary atherosclerosis of native coronary artery    Previous invasive cardiac testing was done at a facility in [place], [place].  Occluded diagonal, Diffuse LAD disease, Occluded Cx, and non obstructive RCA.   . Essential hypertension   . Headache   . History of pneumonia   . ICD (implantable cardioverter-defibrillator) in place   . Ischemic cardiomyopathy    LVEF 30-35%  . Mixed hyperlipidemia   . NSVT (nonsustained ventricular tachycardia) (HCC)   . Persistent atrial fibrillation (HCC)   . PVD (peripheral vascular disease) (HCC)   .  Raynaud's phenomenon   . Statin intolerance   . Stroke [facility]) 2007   Total occlusion of the right internal carotid   Past Surgical History:  Procedure Laterality Date  . ABDOMINAL AORTAGRAM N/A 05/25/2014   Procedure: ABDOMINAL Ronny Flurry;  Surgeon: Iran Ouch, MD;  Location: [facility] CATH LAB;  Service: Cardiovascular;  Laterality: N/A;  . APPENDECTOMY    . CARDIAC DEFIBRILLATOR PLACEMENT     [employer]ude ICD  . CARDIOVERSION N/A 12/01/2020   Procedure: CARDIOVERSION;  Surgeon: [name], MD;  Location: [facility] ENDOSCOPY;  Service: Cardiovascular;  Laterality: N/A;  . CORONARY STENT INTERVENTION N/A 01/08/2021   Procedure: CORONARY STENT INTERVENTION;  Surgeon: Swaziland, Peter M, MD;  Location: [facility] INVASIVE CV LAB;  Service: Cardiovascular;  Laterality: N/A;  . EP IMPLANTABLE DEVICE N/A 03/01/2016   Procedure: ICD Generator Changeout;  Surgeon: [name], MD;  Location: [facility] INVASIVE CV LAB;  Service: Cardiovascular;  Laterality: N/A;  . L1 corpectomy   12/10  . Laryngeal polyp excision    . RIGHT/LEFT HEART CATH AND CORONARY ANGIOGRAPHY N/A 12/18/2020   Procedure: RIGHT/LEFT HEART CATH AND CORONARY ANGIOGRAPHY;  Surgeon: Dolores Patty, MD;  Location: [facility] INVASIVE CV LAB;  Service: Cardiovascular;  Laterality: N/A;  . TEE WITHOUT CARDIOVERSION N/A 12/01/2020   Procedure: TRANSESOPHAGEAL ECHOCARDIOGRAM (TEE);  Surgeon: [name], MD;  Location: [facility] ENDOSCOPY;  Service: Cardiovascular;  Laterality: N/A;    Current Outpatient Medications  Medication Sig Dispense Refill  . amiodarone (PACERONE) 200 MG tablet Take  1 tablet (200 mg total) by mouth daily. 90 tablet 3  . carvedilol (COREG) 6.25 MG tablet Take 1 tablet (6.25 mg total) by mouth 2 (two) times daily with a meal. 60 tablet 5  . clopidogrel (PLAVIX) 75 MG tablet Take 1 tablet (75 mg total) by mouth daily with breakfast. 30 tablet 11  . digoxin 62.5 MCG TABS Take 0.0625 mg by mouth daily. 30 tablet 5  . DULoxetine (CYMBALTA) 30 MG  capsule Take 30 mg by mouth daily.    . DULoxetine (CYMBALTA) 60 MG capsule Take 60 mg by mouth every evening.    . empagliflozin (JARDIANCE) 10 MG TABS tablet Take 1 tablet (10 mg total) by mouth daily before breakfast. 30 tablet 3  . ezetimibe (ZETIA) 10 MG tablet Take 1 tablet (10 mg total) by mouth daily. 30 tablet 3  . famotidine (PEPCID) 20 MG tablet Take 1 tablet (20 mg total) by mouth 2 (two) times daily. 60 tablet 1  . fluticasone (FLONASE) 50 MCG/ACT nasal spray Place 1 spray into both nostrils daily as needed for allergies.     Marland Kitchen gabapentin (NEURONTIN) 300 MG capsule Take 300 mg by mouth See admin instructions. Take 300 mg  in the AM and 600 mg  in the PM    . LORazepam (ATIVAN) 0.5 MG tablet Take 0.5 mg by mouth 3 (three) times daily as needed for anxiety.    . mexiletine (MEXITIL) 150 MG capsule Take 1 capsule (150 mg total) by mouth every 8 (eight) hours. 90 capsule 5  . Multiple Vitamins-Minerals (MULTIVITAMINS THER. W/MINERALS) TABS Take 1 tablet by mouth daily.      . nitroGLYCERIN (NITROSTAT) 0.4 MG SL tablet PLACE 1 TABLET UNDER THE TONGUE EVERY 5 MINUTES FOR 3 DOSES AS NEEDED CHEST PAIN 25 tablet 3  . oxyCODONE-acetaminophen (PERCOCET) 10-325 MG tablet Take 1 tablet by mouth every 4 (four) hours as needed for pain.    . potassium chloride SA (KLOR-CON) 20 MEQ tablet Take 2 tablets (40 mEq total) by mouth daily. 180 each 1  . promethazine (PHENERGAN) 25 MG tablet Take 25 mg by mouth every 6 (six) hours as needed for nausea. With pain medication    . sacubitril-valsartan (ENTRESTO) 49-51 MG Take 1 tablet by mouth 2 (two) times daily. 180 tablet 3  . spironolactone (ALDACTONE) 25 MG tablet Take 1 tablet (25 mg total) by mouth daily. 30 tablet 3  . torsemide (DEMADEX) 20 MG tablet TAKE 4 TABLETS BY MOUTH EVERY DAY. MAY TAKE EXTRA TABLET AS NEEDED 135 tablet 3  . warfarin (COUMADIN) 10 MG tablet TAKE 1 TABLET BY MOUTH EVERY DAY EXCEPT 1/2 TABLET ON MONDAYS AND THURSDAYS (Patient  taking differently: Take 10 mg by mouth See admin instructions. Take 5 mg Mon. Wed, Friday , and Saturday All the other days take 10 mg in the evening) 30 tablet 3   No current facility-administered medications for this visit.    Allergies:   Beef-derived products, Metoclopramide hcl, Penicillins, Pork-derived products, Metoprolol, Reglan [metoclopramide], and Statins   Social History: Social History   Socioeconomic History  . Marital status: Single    Spouse name: Not on file  . Number of children: Not on file  . Years of education: Not on file  . Highest education level: Not on file  Occupational History  . Occupation: Disabled    Comment: ESL  Tobacco Use  . Smoking status: Current Every Day Smoker    Packs/day: 1.00    Years: 43.00  Pack years: 43.00    Types: Cigarettes    Start date: 03/03/1978  . Smokeless tobacco: Never Used  . Tobacco comment: 4 ciggs per day  Vaping Use  . Vaping Use: Every day  Substance and Sexual Activity  . Alcohol use: No    Alcohol/week: 0.0 standard drinks  . Drug use: No    Comment: Prior history of cocaine  . Sexual activity: Yes    Birth control/protection: None  Other Topics Concern  . Not on file  Social History Narrative  . Not on file   Social Determinants of Health   Financial Resource Strain: Not on file  Food Insecurity: Not on file  Transportation Needs: Not on file  Physical Activity: Not on file  Stress: Not on file  Social Connections: Not on file  Intimate Partner Violence: Not on file    Family History: Family History  Problem Relation Age of Onset  . Heart disease Father 40  . Ankylosing spondylitis Sister   . Stomach cancer Brother 33  . Heart attack Brother     Review of Systems: All other systems reviewed and are otherwise negative except as noted above.   Physical Exam: There were no vitals filed for this visit.   GEN- The patient is well appearing, alert and oriented x 3 today.   HEENT:  normocephalic, atraumatic; sclera clear, conjunctiva pink; hearing intact; oropharynx clear; neck supple, no JVP Lymph- no cervical lymphadenopathy Lungs- Clear to ausculation bilaterally, normal work of breathing.  No wheezes, rales, rhonchi Heart- Regular rate and rhythm, no murmurs, rubs or gallops, PMI not laterally displaced GI- soft, non-tender, non-distended, bowel sounds present, no hepatosplenomegaly Extremities- no clubbing or cyanosis. No edema; DP/PT/radial pulses 2+ bilaterally MS- no significant deformity or atrophy Skin- warm and dry, no rash or lesion; ICD pocket well healed Psych- euthymic mood, full affect Neuro- strength and sensation are intact  ICD interrogation- reviewed in detail today,  See PACEART report  EKG:  EKG is not ordered today.  Recent Labs: 12/14/2020: B Natriuretic Peptide 209.0 01/09/2021: Magnesium 2.1 01/30/2021: ALT 25; BUN 11; Creatinine, Ser 0.96; Hemoglobin 14.2; Platelets 171; Potassium 4.7; Sodium 134; TSH 3.986   Wt Readings from Last 3 Encounters:  01/30/21 207 lb 12.8 oz (94.3 kg)  01/11/21 204 lb (92.5 kg)  01/09/21 208 lb 8 oz (94.6 kg)     Other studies Reviewed: Additional studies/ records that were reviewed today include: Previous EP notes, HF notes, ED/Hospital notes.   Assessment and Plan:  1.  Chronic systolic dysfunction s/p [employer] single chamber ICD  euvolemic today Stable on an appropriate medical regimen Normal ICD function See Pace Art report No changes today  2. CAD Stable s/p DES 01/08/21 Continue current medication  3. PAD No claudication  Continues to smoke  4. VT No further on amio + mexitil and s/p DES.  No driving x 6 months (03/02/7424) Hopefully her leg jerking will continue to improve as her amiodarone is decreased. Will have her follow up with Dr. Ladona Ridgel closely to consider continued down titration vs alternative AAD (Few options with CHF and already on mexiletine. Unclear how much of an  effect Ranolazine would have on her)   Current medicines are reviewed at length with the patient today.   The patient {ACTIONS; HAS/DOES NOT HAVE:19233} concerns regarding her medicines.  The following changes were made today:  {NONE DEFAULTED:18576::"none"}  Labs/ tests ordered today include: *** No orders of the defined types were placed in  this encounter.    Disposition:   Follow up with Dr. Ladona Ridgel  4 weeks   Signed, Graciella Freer, PA-C  02/01/2021 8:54 AM  [facility] [address] [place] [address] [phone] (office) [phone] (fax)

## 2021-02-06 ENCOUNTER — Ambulatory Visit (HOSPITAL_COMMUNITY): Payer: Medicare PPO | Admitting: Oncology

## 2021-02-06 DIAGNOSIS — Z79891 Long term (current) use of opiate analgesic: Secondary | ICD-10-CM | POA: Diagnosis not present

## 2021-02-06 DIAGNOSIS — M25569 Pain in unspecified knee: Secondary | ICD-10-CM | POA: Diagnosis not present

## 2021-02-06 DIAGNOSIS — M545 Low back pain, unspecified: Secondary | ICD-10-CM | POA: Diagnosis not present

## 2021-02-06 DIAGNOSIS — G8929 Other chronic pain: Secondary | ICD-10-CM | POA: Diagnosis not present

## 2021-02-06 DIAGNOSIS — G8912 Acute post-thoracotomy pain: Secondary | ICD-10-CM | POA: Diagnosis not present

## 2021-02-06 DIAGNOSIS — G4700 Insomnia, unspecified: Secondary | ICD-10-CM | POA: Diagnosis not present

## 2021-02-08 ENCOUNTER — Telehealth (HOSPITAL_COMMUNITY): Payer: Self-pay | Admitting: Pharmacy Technician

## 2021-02-08 NOTE — Telephone Encounter (Signed)
Patient called in stating that she was having a hard time affording her Mexiletine. Stated that it was $141 at her pharmacy, requested assistance.  Unfortunately there is no assistance options for this medication with her Medicare insurance. She stated she was able to apply a goodrx card and get the medication at an affordable price. No further action needed at this time.  Archer Asa, CPhT

## 2021-02-09 DIAGNOSIS — G4700 Insomnia, unspecified: Secondary | ICD-10-CM | POA: Insufficient documentation

## 2021-02-12 ENCOUNTER — Inpatient Hospital Stay: Payer: Medicare PPO | Admitting: Oncology

## 2021-02-12 ENCOUNTER — Telehealth: Payer: Self-pay | Admitting: *Deleted

## 2021-02-12 ENCOUNTER — Other Ambulatory Visit: Payer: Self-pay | Admitting: Cardiology

## 2021-02-12 ENCOUNTER — Inpatient Hospital Stay: Payer: Medicare PPO

## 2021-02-12 NOTE — Telephone Encounter (Signed)
Has had viral illness over weekend w/N/V and temp 102. Fever gone and she is improving. Felt best to cancel today and reschedule. Appointment cancelled and scheduling message sent to reschedule lab/OV for 1 month.

## 2021-02-13 ENCOUNTER — Telehealth: Payer: Self-pay | Admitting: Oncology

## 2021-02-13 ENCOUNTER — Ambulatory Visit (INDEPENDENT_AMBULATORY_CARE_PROVIDER_SITE_OTHER): Payer: Medicare PPO | Admitting: *Deleted

## 2021-02-13 DIAGNOSIS — I63549 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: Secondary | ICD-10-CM | POA: Diagnosis not present

## 2021-02-13 DIAGNOSIS — D6859 Other primary thrombophilia: Secondary | ICD-10-CM

## 2021-02-13 DIAGNOSIS — D6861 Antiphospholipid syndrome: Secondary | ICD-10-CM | POA: Diagnosis not present

## 2021-02-13 DIAGNOSIS — Z5181 Encounter for therapeutic drug level monitoring: Secondary | ICD-10-CM

## 2021-02-13 LAB — POCT INR: INR: 3.6 — AB (ref 2.0–3.0)

## 2021-02-13 NOTE — Telephone Encounter (Signed)
Scheduled appt per 2/14 sch msg - patient is aware of appt date and time

## 2021-02-13 NOTE — Patient Instructions (Addendum)
Hold warfarin tonight then decrease dose to 1/2 tablet daily except 1 tablets on Sundays and Thursdays  (has been doing Sundays, Tuesdays and Thursdays) Recheck in 2 wks

## 2021-02-16 ENCOUNTER — Telehealth: Payer: Self-pay | Admitting: Cardiology

## 2021-02-16 DIAGNOSIS — J329 Chronic sinusitis, unspecified: Secondary | ICD-10-CM | POA: Diagnosis not present

## 2021-02-16 DIAGNOSIS — I779 Disorder of arteries and arterioles, unspecified: Secondary | ICD-10-CM | POA: Diagnosis not present

## 2021-02-16 DIAGNOSIS — I5022 Chronic systolic (congestive) heart failure: Secondary | ICD-10-CM | POA: Diagnosis not present

## 2021-02-16 DIAGNOSIS — Z299 Encounter for prophylactic measures, unspecified: Secondary | ICD-10-CM | POA: Diagnosis not present

## 2021-02-16 DIAGNOSIS — F17210 Nicotine dependence, cigarettes, uncomplicated: Secondary | ICD-10-CM | POA: Diagnosis not present

## 2021-02-16 DIAGNOSIS — M1710 Unilateral primary osteoarthritis, unspecified knee: Secondary | ICD-10-CM | POA: Diagnosis not present

## 2021-02-16 NOTE — Telephone Encounter (Signed)
Patient called stating that Dr. Shirlee Latch requested that she increase her Entresto. She was requesting to speak with Isabelle Course.

## 2021-02-21 ENCOUNTER — Other Ambulatory Visit: Payer: Self-pay

## 2021-02-21 ENCOUNTER — Ambulatory Visit (INDEPENDENT_AMBULATORY_CARE_PROVIDER_SITE_OTHER): Payer: Medicare PPO | Admitting: Cardiology

## 2021-02-21 ENCOUNTER — Encounter: Payer: Self-pay | Admitting: Cardiology

## 2021-02-21 VITALS — BP 134/80 | HR 76 | Ht 66.0 in | Wt 205.0 lb

## 2021-02-21 DIAGNOSIS — I25119 Atherosclerotic heart disease of native coronary artery with unspecified angina pectoris: Secondary | ICD-10-CM | POA: Diagnosis not present

## 2021-02-21 DIAGNOSIS — I255 Ischemic cardiomyopathy: Secondary | ICD-10-CM

## 2021-02-21 DIAGNOSIS — Z720 Tobacco use: Secondary | ICD-10-CM | POA: Diagnosis not present

## 2021-02-21 DIAGNOSIS — I472 Ventricular tachycardia: Secondary | ICD-10-CM

## 2021-02-21 DIAGNOSIS — I4819 Other persistent atrial fibrillation: Secondary | ICD-10-CM | POA: Diagnosis not present

## 2021-02-21 DIAGNOSIS — I5022 Chronic systolic (congestive) heart failure: Secondary | ICD-10-CM | POA: Diagnosis not present

## 2021-02-21 DIAGNOSIS — I4729 Other ventricular tachycardia: Secondary | ICD-10-CM

## 2021-02-21 MED ORDER — ENTRESTO 49-51 MG PO TABS: 1.0000 | ORAL_TABLET | Freq: Two times a day (BID) | ORAL | 11 refills | Status: DC

## 2021-02-21 NOTE — Patient Instructions (Signed)
Medication Instructions:  Your physician recommends that you continue on your current medications as directed. Please refer to the Current Medication list given to you today.  *If you need a refill on your cardiac medications before your next appointment, please call your pharmacy*   Lab Work: NONE   If you have labs (blood work) drawn today and your tests are completely normal, you will receive your results only by: MyChart Message (if you have MyChart) OR A paper copy in the mail If you have any lab test that is abnormal or we need to change your treatment, we will call you to review the results.   Testing/Procedures: NONE    Follow-Up: At [facility], you and your health needs are our priority.  As part of our continuing mission to provide you with exceptional heart care, we have created designated Provider Care Teams.  These Care Teams include your primary Cardiologist (physician) and Advanced Practice Providers (APPs -  Physician Assistants and Nurse Practitioners) who all work together to provide you with the care you need, when you need it.  We recommend signing up for the patient portal called "MyChart".  Sign up information is provided on this After Visit Summary.  MyChart is used to connect with patients for Virtual Visits (Telemedicine).  Patients are able to view lab/test results, encounter notes, upcoming appointments, etc.  Non-urgent messages can be sent to your provider as well.   To learn more about what you can do with MyChart, go to [email or web address].    Your next appointment:   3 month(s)  The format for your next appointment:   In Person  Provider:   Samuel McDowell, MD   Other Instructions Thank you for choosing [facility]!     

## 2021-02-21 NOTE — Progress Notes (Signed)
Cardiology Office Note  Date: 02/21/2021   ID: [name] [name], DOB 05/20/1961, MRN [id number]  PCP:  Ignatius Specking, MD  Cardiologist:  Nona Dell, MD Electrophysiologist:  Lewayne Bunting, MD   Chief Complaint  Patient presents with  . Cardiac follow-up    History of Present Illness: [name] [name] is a medically complex 60 y.o. female last seen in early February in the heart failure clinic by Dr. Shirlee Latch, I reviewed the note.  I reviewed extensive records since our last encounter in September 2021.  She presents for a routine visit, states that she has been doing reasonably well overall. Her mother did pass away in January, she has had been dealing with grief. Sleep has been gradually getting better. She does not report any recent angina symptoms, no obvious device shocks or syncope.  We went over her medications which are outlined below, Entresto most recently to increase to moderate dose. Weight has been stable. I reviewed her most recent lab work.  She remains on Coumadin with follow-up in the anticoagulation clinic. No reported spontaneous bleeding problems, she also remains on Plavix with recent DES to the circumflex in January.  She follows with Dr. Ladona Ridgel, [facility] Scientific ICD in place. No driving for 6 months since her last device shock.  Past Medical History:  Diagnosis Date  . Aneurysm of internal carotid artery   . Anticardiolipin antibody positive    Chronic Coumadin  . Brain aneurysm    followed by Dr. Corliss Skains  . Chronic combined systolic (congestive) and diastolic (congestive) heart failure (HCC)   . Cocaine abuse in remission (HCC)   . Coronary atherosclerosis of native coronary artery    Previous invasive cardiac testing was done at a facility in [place], [place].  Occluded diagonal, Diffuse LAD disease, Occluded Cx, and non obstructive RCA.   . Essential hypertension   . Headache   . History of pneumonia   . ICD (implantable  cardioverter-defibrillator) in place   . Ischemic cardiomyopathy    LVEF 30-35%  . Mixed hyperlipidemia   . NSVT (nonsustained ventricular tachycardia) (HCC)   . Persistent atrial fibrillation (HCC)   . PVD (peripheral vascular disease) (HCC)   . Raynaud's phenomenon   . Statin intolerance   . Stroke [facility]) 2007   Total occlusion of the right internal carotid    Past Surgical History:  Procedure Laterality Date  . ABDOMINAL AORTAGRAM N/A 05/25/2014   Procedure: ABDOMINAL Ronny Flurry;  Surgeon: Iran Ouch, MD;  Location: [facility] CATH LAB;  Service: Cardiovascular;  Laterality: N/A;  . APPENDECTOMY    . CARDIAC DEFIBRILLATOR PLACEMENT     St[employer]Jude ICD  . CARDIOVERSION N/A 12/01/2020   Procedure: CARDIOVERSION;  Surgeon: [name], MD;  Location: [facility] ENDOSCOPY;  Service: Cardiovascular;  Laterality: N/A;  . CORONARY STENT INTERVENTION N/A 01/08/2021   Procedure: CORONARY STENT INTERVENTION;  Surgeon: Swaziland, Peter M, MD;  Location: [facility] INVASIVE CV LAB;  Service: Cardiovascular;  Laterality: N/A;  . EP IMPLANTABLE DEVICE N/A 03/01/2016   Procedure: ICD Generator Changeout;  Surgeon: [name], MD;  Location: [facility] INVASIVE CV LAB;  Service: Cardiovascular;  Laterality: N/A;  . L1 corpectomy   12/10  . Laryngeal polyp excision    . RIGHT/LEFT HEART CATH AND CORONARY ANGIOGRAPHY N/A 12/18/2020   Procedure: RIGHT/LEFT HEART CATH AND CORONARY ANGIOGRAPHY;  Surgeon: Dolores Patty, MD;  Location: [facility] INVASIVE CV LAB;  Service: Cardiovascular;  Laterality: N/A;  . TEE WITHOUT CARDIOVERSION N/A  12/01/2020   Procedure: TRANSESOPHAGEAL ECHOCARDIOGRAM (TEE);  Surgeon: [name], MD;  Location: [facility] ENDOSCOPY;  Service: Cardiovascular;  Laterality: N/A;    Current Outpatient Medications  Medication Sig Dispense Refill  . amiodarone (PACERONE) 200 MG tablet Take 1 tablet (200 mg total) by mouth daily. 90 tablet 3  . carvedilol (COREG) 6.25 MG tablet Take 1 tablet (6.25 mg total) by mouth  2 (two) times daily with a meal. 60 tablet 5  . clopidogrel (PLAVIX) 75 MG tablet Take 1 tablet (75 mg total) by mouth daily with breakfast. 30 tablet 11  . digoxin 62.5 MCG TABS Take 0.0625 mg by mouth daily. 30 tablet 5  . DULoxetine (CYMBALTA) 30 MG capsule Take 30 mg by mouth daily.    . DULoxetine (CYMBALTA) 60 MG capsule Take 60 mg by mouth every evening.    . empagliflozin (JARDIANCE) 10 MG TABS tablet Take 1 tablet (10 mg total) by mouth daily before breakfast. 30 tablet 3  . ezetimibe (ZETIA) 10 MG tablet Take 1 tablet (10 mg total) by mouth daily. 30 tablet 3  . famotidine (PEPCID) 20 MG tablet Take 1 tablet (20 mg total) by mouth 2 (two) times daily. 60 tablet 1  . fluticasone (FLONASE) 50 MCG/ACT nasal spray Place 1 spray into both nostrils daily as needed for allergies.     Marland Kitchen gabapentin (NEURONTIN) 300 MG capsule Take 200 mg by mouth See admin instructions. Take 300 mg  in the AM and 600 mg  in the PM    . mexiletine (MEXITIL) 150 MG capsule Take 1 capsule (150 mg total) by mouth every 8 (eight) hours. 90 capsule 5  . Multiple Vitamins-Minerals (MULTIVITAMINS THER. W/MINERALS) TABS Take 1 tablet by mouth daily.      . nitroGLYCERIN (NITROSTAT) 0.4 MG SL tablet PLACE 1 TABLET UNDER THE TONGUE EVERY 5 MINUTES FOR 3 DOSES AS NEEDED CHEST PAIN 25 tablet 3  . oxyCODONE-acetaminophen (PERCOCET) 10-325 MG tablet Take 1 tablet by mouth every 4 (four) hours as needed for pain.    . potassium chloride SA (KLOR-CON) 20 MEQ tablet Take 2 tablets (40 mEq total) by mouth daily. 180 tablet 1  . promethazine (PHENERGAN) 25 MG tablet Take 25 mg by mouth every 6 (six) hours as needed for nausea. With pain medication    . spironolactone (ALDACTONE) 25 MG tablet Take 1 tablet (25 mg total) by mouth daily. 30 tablet 3  . torsemide (DEMADEX) 20 MG tablet TAKE 4 TABLETS BY MOUTH EVERY DAY. MAY TAKE EXTRA TABLET AS NEEDED 135 tablet 3  . warfarin (COUMADIN) 10 MG tablet TAKE 1/2 TABLET BY MOUTH EVERY DAY  EXCEPT 1 TABLET ON SUNDAYS AND THURSDAYS 30 tablet 3  . sacubitril-valsartan (ENTRESTO) 49-51 MG Take 1 tablet by mouth 2 (two) times daily. 60 tablet 11   No current facility-administered medications for this visit.   Allergies:  Beef-derived products, Metoclopramide hcl, Penicillins, Pork-derived products, Metoprolol, Reglan [metoclopramide], and Statins   Social History: The patient  reports that she has been smoking cigarettes. She started smoking about 43 years ago. She has a 43.00 pack-year smoking history. She has never used smokeless tobacco. She reports that she does not drink alcohol and does not use drugs.   Family History: The patient's family history includes Ankylosing spondylitis in her sister; Heart attack in her brother; Heart disease (age of onset: 80) in her father; Stomach cancer (age of onset: 88) in her brother.   ROS: No orthopnea or PND.  Physical  Exam: VS:  BP 134/80   Pulse 76   Ht 5\' 6"  (1.676 m)   Wt 205 lb (93 kg)   SpO2 98%   BMI 33.09 kg/m , BMI Body mass index is 33.09 kg/m.  Wt Readings from Last 3 Encounters:  02/21/21 205 lb (93 kg)  01/30/21 207 lb 12.8 oz (94.3 kg)  01/11/21 204 lb (92.5 kg)    General: Patient appears comfortable at rest. HEENT: Conjunctiva and lids normal, wearing a mask. Neck: Supple, no elevated JVP or carotid bruits, no thyromegaly. Lungs: Clear to auscultation, nonlabored breathing at rest. Cardiac: Regular rate and rhythm, no S3 or significant systolic murmur, no pericardial rub. Extremities: No pitting edema.  ECG:  An ECG dated 01/30/2021 was personally reviewed today and demonstrated:  Sinus rhythm with IVCD and old anterior infarct pattern, prolonged PR interval. Nonspecific ST changes.  Recent Labwork: 12/14/2020: B Natriuretic Peptide 209.0 01/09/2021: Magnesium 2.1 01/30/2021: ALT 25; AST 24; BUN 11; Creatinine, Ser 0.96; Hemoglobin 14.2; Platelets 171; Potassium 4.7; Sodium 134; TSH 3.986     Component Value  Date/Time   CHOL 120 12/16/2020 0341   TRIG 85 12/16/2020 0341   HDL 31 (L) 12/16/2020 0341   CHOLHDL 3.9 12/16/2020 0341   VLDL 17 12/16/2020 0341   LDLCALC 72 12/16/2020 0341    Other Studies Reviewed Today:  TEE 12/01/2020: 1. Left ventricular ejection fraction, by estimation, is 25 to 30%. The  left ventricle has severely decreased function. The left ventricle  demonstrates regional wall motion abnormalities with severe hypokinesis to  akinesis of the anterior and septal  walls. The left ventricular internal cavity size was mildly dilated.  2. Right ventricular systolic function is mildly reduced. The right  ventricular size is normal.  3. Left atrial size was moderately dilated. No left atrial/left atrial  appendage thrombus was detected.  4. Right atrial size was mildly dilated.  5. There was moderate mitral regurgitation with ERO 0.26 cm^2 by PISA  (suspect functional MR). No evidence of mitral stenosis.  6. No PFO/ASD by color doppler.  7. The aortic valve is tricuspid. Aortic valve regurgitation is not  visualized. No aortic stenosis is present.  8. Peak RV-RA gradient 18 mmHg.  9. Normal caliber thoracic aorta with no significant plaque.   Carotid Dopplers 12/15/2020: IMPRESSION: 1. 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 2. Small amount of plaque in the left internal carotid artery. Peak systolic velocity in the left internal artery has slightly elevated, now measuring up to 128 cm/sec. Estimated degree of stenosis in the left internal carotid artery is less than 50% based on the carotid artery ratio but recommend continued surveillance. 3. Patent vertebral arteries with antegrade flow. 4. Bilateral external carotid arteries are patent. Again noted is an elevated peak systolic velocity in the right external carotid Artery.  Cardiac catheterization 12/18/2020:  1st Diag lesion is 100% stenosed.  Mid LAD lesion is 45% stenosed.  Prox  Cx to Mid Cx lesion is 95% stenosed.  Prox RCA lesion is 30% stenosed.   Findings:  Ao = 125/65 (88)  LV = 127/13 RA =  1 RV = 27/4 PA = 24/6 (14) PCW = 12 Fick cardiac output/index = 6.2/3.0 PVR = 0.3 WU Ao sat = 99% PA sat = 76%, 77% High SVC sat = 80%  Assessment: 1. 2v CAD with 95% mid LCX and chronically occluded D1 2. EF 25% 3. Well compensated hemodynamics with high cardiac output.  4. No evidence of  intracardiac shunting  PCI 01/08/2021:  Prox Cx to Mid Cx lesion is 95% stenosed.  Post intervention, there is a 0% residual stenosis.  A drug-eluting stent was successfully placed using a STENT RESOLUTE ONYX 2.25X22.   1. Successful PCI of the LCx with DES x 1  Assessment and Plan:  1. CAD status post remote anterior infarct. No progressive angina at this time. She has known occlusion of the diagonal and underwent recent DES to the mid circumflex in January, continues on Plavix. Otherwise remains on Zetia with statin intolerance, last LDL was 72.  2. Antiphospholipid antibody syndrome with history of previous stroke. She is chronically anticoagulated with Coumadin.  3. Ischemic cardiomyopathy with LVEF 25 to 30% by recent TEE. Stable from the perspective of chronic systolic heart failure, no change in weight. Continues on [place], Coreg, Lanoxin, Aldactone, Demadex, and Jardiance. Entresto dose being increased to 49/51 mg twice daily (not yet filled).  4. Paroxysmal to persistent atrial fibrillation, CHA2DS2-VASc score is 6. She is on Coumadin for stroke prophylaxis, status post successful TEE guided cardioversion in December 2021.  5. History of recurrent VT with appropriate ICD discharge and syncope. Currently on amiodarone and mexiletine with driving restrictions for 6 months. Continues to follow with EP. Suspect scar mediated VT, so possibility of ablation could be considered if necessary.  6. Tobacco abuse. Smoking cessation has been discussed over time. She  is try to quit on her own.  Medication Adjustments/Labs and Tests Ordered: Current medicines are reviewed at length with the patient today.  Concerns regarding medicines are outlined above.   Tests Ordered: No orders of the defined types were placed in this encounter.   Medication Changes: Meds ordered this encounter  Medications  . DISCONTD: sacubitril-valsartan (ENTRESTO) 49-51 MG    Sig: Take 1 tablet by mouth 2 (two) times daily.    Dispense:  60 tablet    Refill:  11  . sacubitril-valsartan (ENTRESTO) 49-51 MG    Sig: Take 1 tablet by mouth 2 (two) times daily.    Dispense:  60 tablet    Refill:  11    Disposition:  Follow up 3 months in the [place] office.  Signed, [name], MD, [facility] 02/21/2021 10:44 AM    [facility] at [facility] [address] [place], [place] [address] Phone: [phone]; Fax: [phone]

## 2021-02-26 NOTE — Progress Notes (Signed)
Electrophysiology Office Note Date: 02/26/2021  ID:  [name] [name], [name] [name] 11-Jul-1961, MRN [id number]  PCP: Ignatius Specking, MD Primary Cardiologist: Nona Dell, MD Electrophysiologist: Lewayne Bunting, MD   CC: Routine ICD follow-up  [name] [name] is a 60 y.o. female seen today for Lewayne Bunting, MD for post hospital follow up.  Since last being seen in our clinic the patient reports doing well. She has had no further ICD shocks since admission in January. She has chronic, intermittent CP and follows with HF clinic. She sees them this afternoon. SOB is stable.   Device History: Field seismologist ICD implanted 2017 for chronic systolic CHF History of appropriate therapy: Yes History of AAD therapy: Yes; currently on amiodarone   Past Medical History:  Diagnosis Date  . Aneurysm of internal carotid artery   . Anticardiolipin antibody positive    Chronic Coumadin  . Brain aneurysm    followed by Dr. Corliss Skains  . Chronic combined systolic (congestive) and diastolic (congestive) heart failure (HCC)   . Cocaine abuse in remission (HCC)   . Coronary atherosclerosis of native coronary artery    Previous invasive cardiac testing was done at a facility in [place], [place].  Occluded diagonal, Diffuse LAD disease, Occluded Cx, and non obstructive RCA.   . Essential hypertension   . Headache   . History of pneumonia   . ICD (implantable cardioverter-defibrillator) in place   . Ischemic cardiomyopathy    LVEF 30-35%  . Mixed hyperlipidemia   . NSVT (nonsustained ventricular tachycardia) (HCC)   . Persistent atrial fibrillation (HCC)   . PVD (peripheral vascular disease) (HCC)   . Raynaud's phenomenon   . Statin intolerance   . Stroke [facility]) 2007   Total occlusion of the right internal carotid   Past Surgical History:  Procedure Laterality Date  . ABDOMINAL AORTAGRAM N/A 05/25/2014   Procedure: ABDOMINAL Ronny Flurry;  Surgeon: Iran Ouch, MD;  Location: [facility] CATH  LAB;  Service: Cardiovascular;  Laterality: N/A;  . APPENDECTOMY    . CARDIAC DEFIBRILLATOR PLACEMENT     [employer].Jude ICD  . CARDIOVERSION N/A 12/01/2020   Procedure: CARDIOVERSION;  Surgeon: [name], MD;  Location: [facility] ENDOSCOPY;  Service: Cardiovascular;  Laterality: N/A;  . CORONARY STENT INTERVENTION N/A 01/08/2021   Procedure: CORONARY STENT INTERVENTION;  Surgeon: Swaziland, Peter M, MD;  Location: [facility] INVASIVE CV LAB;  Service: Cardiovascular;  Laterality: N/A;  . EP IMPLANTABLE DEVICE N/A 03/01/2016   Procedure: ICD Generator Changeout;  Surgeon: [name], MD;  Location: [facility] INVASIVE CV LAB;  Service: Cardiovascular;  Laterality: N/A;  . L1 corpectomy   12/10  . Laryngeal polyp excision    . RIGHT/LEFT HEART CATH AND CORONARY ANGIOGRAPHY N/A 12/18/2020   Procedure: RIGHT/LEFT HEART CATH AND CORONARY ANGIOGRAPHY;  Surgeon: Dolores Patty, MD;  Location: [facility] INVASIVE CV LAB;  Service: Cardiovascular;  Laterality: N/A;  . TEE WITHOUT CARDIOVERSION N/A 12/01/2020   Procedure: TRANSESOPHAGEAL ECHOCARDIOGRAM (TEE);  Surgeon: [name], MD;  Location: [facility] ENDOSCOPY;  Service: Cardiovascular;  Laterality: N/A;    Current Outpatient Medications  Medication Sig Dispense Refill  . amiodarone (PACERONE) 200 MG tablet Take 1 tablet (200 mg total) by mouth daily. 90 tablet 3  . carvedilol (COREG) 6.25 MG tablet Take 1 tablet (6.25 mg total) by mouth 2 (two) times daily with a meal. 60 tablet 5  . clopidogrel (PLAVIX) 75 MG tablet Take 1 tablet (75 mg total) by mouth daily with  breakfast. 30 tablet 11  . digoxin 62.5 MCG TABS Take 0.0625 mg by mouth daily. 30 tablet 5  . DULoxetine (CYMBALTA) 30 MG capsule Take 30 mg by mouth daily.    . DULoxetine (CYMBALTA) 60 MG capsule Take 60 mg by mouth every evening.    . empagliflozin (JARDIANCE) 10 MG TABS tablet Take 1 tablet (10 mg total) by mouth daily before breakfast. 30 tablet 3  . ezetimibe (ZETIA) 10 MG tablet Take 1 tablet (10 mg  total) by mouth daily. 30 tablet 3  . famotidine (PEPCID) 20 MG tablet Take 1 tablet (20 mg total) by mouth 2 (two) times daily. 60 tablet 1  . fluticasone (FLONASE) 50 MCG/ACT nasal spray Place 1 spray into both nostrils daily as needed for allergies.     Marland Kitchen gabapentin (NEURONTIN) 300 MG capsule Take 200 mg by mouth See admin instructions. Take 300 mg  in the AM and 600 mg  in the PM    . mexiletine (MEXITIL) 150 MG capsule Take 1 capsule (150 mg total) by mouth every 8 (eight) hours. 90 capsule 5  . Multiple Vitamins-Minerals (MULTIVITAMINS THER. W/MINERALS) TABS Take 1 tablet by mouth daily.      . nitroGLYCERIN (NITROSTAT) 0.4 MG SL tablet PLACE 1 TABLET UNDER THE TONGUE EVERY 5 MINUTES FOR 3 DOSES AS NEEDED CHEST PAIN 25 tablet 3  . oxyCODONE-acetaminophen (PERCOCET) 10-325 MG tablet Take 1 tablet by mouth every 4 (four) hours as needed for pain.    . potassium chloride SA (KLOR-CON) 20 MEQ tablet Take 2 tablets (40 mEq total) by mouth daily. 180 tablet 1  . promethazine (PHENERGAN) 25 MG tablet Take 25 mg by mouth every 6 (six) hours as needed for nausea. With pain medication    . sacubitril-valsartan (ENTRESTO) 49-51 MG Take 1 tablet by mouth 2 (two) times daily. 60 tablet 11  . spironolactone (ALDACTONE) 25 MG tablet Take 1 tablet (25 mg total) by mouth daily. 30 tablet 3  . torsemide (DEMADEX) 20 MG tablet TAKE 4 TABLETS BY MOUTH EVERY DAY. MAY TAKE EXTRA TABLET AS NEEDED 135 tablet 3  . warfarin (COUMADIN) 10 MG tablet TAKE 1/2 TABLET BY MOUTH EVERY DAY EXCEPT 1 TABLET ON SUNDAYS AND THURSDAYS 30 tablet 3   No current facility-administered medications for this visit.    Allergies:   Beef-derived products, Metoclopramide hcl, Penicillins, Pork-derived products, Metoprolol, Reglan [metoclopramide], and Statins   Social History: Social History   Socioeconomic History  . Marital status: Single    Spouse name: Not on file  . Number of children: Not on file  . Years of education: Not on  file  . Highest education level: Not on file  Occupational History  . Occupation: Disabled    Comment: ESL  Tobacco Use  . Smoking status: Current Every Day Smoker    Packs/day: 1.00    Years: 43.00    Pack years: 43.00    Types: Cigarettes    Start date: 03/03/1978  . Smokeless tobacco: Never Used  . Tobacco comment: 4 ciggs per day  Vaping Use  . Vaping Use: Every day  Substance and Sexual Activity  . Alcohol use: No    Alcohol/week: 0.0 standard drinks  . Drug use: No    Comment: Prior history of cocaine  . Sexual activity: Yes    Birth control/protection: None  Other Topics Concern  . Not on file  Social History Narrative  . Not on file   Social Determinants of Health  Financial Resource Strain: Not on file  Food Insecurity: Not on file  Transportation Needs: Not on file  Physical Activity: Not on file  Stress: Not on file  Social Connections: Not on file  Intimate Partner Violence: Not on file    Family History: Family History  Problem Relation Age of Onset  . Heart disease Father 53  . Ankylosing spondylitis Sister   . Stomach cancer Brother 68  . Heart attack Brother     Review of Systems: All other systems reviewed and are otherwise negative except as noted above.   Physical Exam: There were no vitals filed for this visit.   GEN- The patient is well appearing, alert and oriented x 3 today.   HEENT: normocephalic, atraumatic; sclera clear, conjunctiva pink; hearing intact; oropharynx clear; neck supple, no JVP Lymph- no cervical lymphadenopathy Lungs- Clear to ausculation bilaterally, normal work of breathing.  No wheezes, rales, rhonchi Heart- Regular rate and rhythm, no murmurs, rubs or gallops, PMI not laterally displaced GI- soft, non-tender, non-distended, bowel sounds present, no hepatosplenomegaly Extremities- no clubbing or cyanosis. No edema; DP/PT/radial pulses 2+ bilaterally MS- no significant deformity or atrophy Skin- warm and dry, no  rash or lesion; ICD pocket well healed Psych- euthymic mood, full affect Neuro- strength and sensation are intact  ICD interrogation- reviewed in detail today,  See PACEART report  EKG:  EKG is not ordered today.  Recent Labs: 12/14/2020: B Natriuretic Peptide 209.0 01/09/2021: Magnesium 2.1 01/30/2021: ALT 25; BUN 11; Creatinine, Ser 0.96; Hemoglobin 14.2; Platelets 171; Potassium 4.7; Sodium 134; TSH 3.986   Wt Readings from Last 3 Encounters:  02/21/21 205 lb (93 kg)  01/30/21 207 lb 12.8 oz (94.3 kg)  01/11/21 204 lb (92.5 kg)     Other studies Reviewed: Additional studies/ records that were reviewed today include: Previous EP and HF notes.   Assessment and Plan:  1.  Chronic systolic dysfunction s/p [employer] dual chamber ICD  euvolemic today Stable on an appropriate medical regimen Normal ICD function See Pace Art report No changes today  2. VT No further s/p recent PCI Continue amiodarone and BB. Will defer labs to HF visit this afternoon.  3. PVCs Stable. Not ablation candidate with multiple morphologies.  Continue amio   Current medicines are reviewed at length with the patient today.   The patient does not have concerns regarding her medicines.  The following changes were made today:  none  Labs/ tests ordered today include:  No orders of the defined types were placed in this encounter.   Disposition:   Follow up with Dr. Ladona Ridgel  6 months  Signed, Graciella Freer, PA-C  02/26/2021 10:21 AM  [facility] [address] [place] [address] [phone] (office) [phone] (fax)

## 2021-02-27 ENCOUNTER — Ambulatory Visit (INDEPENDENT_AMBULATORY_CARE_PROVIDER_SITE_OTHER): Payer: Medicare PPO | Admitting: Student

## 2021-02-27 ENCOUNTER — Other Ambulatory Visit: Payer: Self-pay

## 2021-02-27 ENCOUNTER — Encounter: Payer: Self-pay | Admitting: Student

## 2021-02-27 ENCOUNTER — Ambulatory Visit (HOSPITAL_BASED_OUTPATIENT_CLINIC_OR_DEPARTMENT_OTHER)
Admission: RE | Admit: 2021-02-27 | Discharge: 2021-02-27 | Disposition: A | Discharge status: Home / Self Care | Payer: Medicare PPO | Source: Ambulatory Visit | Attending: Family Medicine | Admitting: Family Medicine

## 2021-02-27 VITALS — BP 100/66 | HR 76 | Ht 65.5 in | Wt 199.4 lb

## 2021-02-27 VITALS — BP 103/64 | HR 77 | Wt 201.4 lb

## 2021-02-27 DIAGNOSIS — F17210 Nicotine dependence, cigarettes, uncomplicated: Secondary | ICD-10-CM | POA: Diagnosis present

## 2021-02-27 DIAGNOSIS — I6521 Occlusion and stenosis of right carotid artery: Secondary | ICD-10-CM | POA: Diagnosis present

## 2021-02-27 DIAGNOSIS — I5022 Chronic systolic (congestive) heart failure: Secondary | ICD-10-CM

## 2021-02-27 DIAGNOSIS — I110 Hypertensive heart disease with heart failure: Secondary | ICD-10-CM | POA: Insufficient documentation

## 2021-02-27 DIAGNOSIS — I2510 Atherosclerotic heart disease of native coronary artery without angina pectoris: Secondary | ICD-10-CM

## 2021-02-27 DIAGNOSIS — I69354 Hemiplegia and hemiparesis following cerebral infarction affecting left non-dominant side: Secondary | ICD-10-CM | POA: Diagnosis not present

## 2021-02-27 DIAGNOSIS — Z8249 Family history of ischemic heart disease and other diseases of the circulatory system: Secondary | ICD-10-CM | POA: Insufficient documentation

## 2021-02-27 DIAGNOSIS — I4729 Other ventricular tachycardia: Secondary | ICD-10-CM

## 2021-02-27 DIAGNOSIS — R339 Retention of urine, unspecified: Secondary | ICD-10-CM | POA: Diagnosis not present

## 2021-02-27 DIAGNOSIS — Z9861 Coronary angioplasty status: Secondary | ICD-10-CM | POA: Diagnosis not present

## 2021-02-27 DIAGNOSIS — I2582 Chronic total occlusion of coronary artery: Secondary | ICD-10-CM | POA: Diagnosis present

## 2021-02-27 DIAGNOSIS — I255 Ischemic cardiomyopathy: Secondary | ICD-10-CM | POA: Diagnosis present

## 2021-02-27 DIAGNOSIS — I4819 Other persistent atrial fibrillation: Secondary | ICD-10-CM | POA: Diagnosis present

## 2021-02-27 DIAGNOSIS — D696 Thrombocytopenia, unspecified: Secondary | ICD-10-CM | POA: Diagnosis present

## 2021-02-27 DIAGNOSIS — I739 Peripheral vascular disease, unspecified: Secondary | ICD-10-CM | POA: Insufficient documentation

## 2021-02-27 DIAGNOSIS — Y838 Other surgical procedures as the cause of abnormal reaction of the patient, or of later complication, without mention of misadventure at the time of the procedure: Secondary | ICD-10-CM | POA: Diagnosis present

## 2021-02-27 DIAGNOSIS — I4891 Unspecified atrial fibrillation: Secondary | ICD-10-CM | POA: Diagnosis not present

## 2021-02-27 DIAGNOSIS — I25119 Atherosclerotic heart disease of native coronary artery with unspecified angina pectoris: Secondary | ICD-10-CM | POA: Diagnosis not present

## 2021-02-27 DIAGNOSIS — Z7901 Long term (current) use of anticoagulants: Secondary | ICD-10-CM | POA: Insufficient documentation

## 2021-02-27 DIAGNOSIS — R0989 Other specified symptoms and signs involving the circulatory and respiratory systems: Secondary | ICD-10-CM | POA: Diagnosis not present

## 2021-02-27 DIAGNOSIS — Z8673 Personal history of transient ischemic attack (TIA), and cerebral infarction without residual deficits: Secondary | ICD-10-CM | POA: Insufficient documentation

## 2021-02-27 DIAGNOSIS — F17200 Nicotine dependence, unspecified, uncomplicated: Secondary | ICD-10-CM | POA: Insufficient documentation

## 2021-02-27 DIAGNOSIS — E876 Hypokalemia: Secondary | ICD-10-CM | POA: Diagnosis not present

## 2021-02-27 DIAGNOSIS — K5900 Constipation, unspecified: Secondary | ICD-10-CM | POA: Diagnosis present

## 2021-02-27 DIAGNOSIS — I472 Ventricular tachycardia: Secondary | ICD-10-CM

## 2021-02-27 DIAGNOSIS — F1411 Cocaine abuse, in remission: Secondary | ICD-10-CM | POA: Diagnosis present

## 2021-02-27 DIAGNOSIS — Z4502 Encounter for adjustment and management of automatic implantable cardiac defibrillator: Secondary | ICD-10-CM | POA: Diagnosis not present

## 2021-02-27 DIAGNOSIS — Z7902 Long term (current) use of antithrombotics/antiplatelets: Secondary | ICD-10-CM | POA: Insufficient documentation

## 2021-02-27 DIAGNOSIS — J309 Allergic rhinitis, unspecified: Secondary | ICD-10-CM | POA: Diagnosis present

## 2021-02-27 DIAGNOSIS — I959 Hypotension, unspecified: Secondary | ICD-10-CM | POA: Diagnosis not present

## 2021-02-27 DIAGNOSIS — I5043 Acute on chronic combined systolic (congestive) and diastolic (congestive) heart failure: Secondary | ICD-10-CM | POA: Diagnosis not present

## 2021-02-27 DIAGNOSIS — I252 Old myocardial infarction: Secondary | ICD-10-CM | POA: Diagnosis not present

## 2021-02-27 DIAGNOSIS — D6861 Antiphospholipid syndrome: Secondary | ICD-10-CM | POA: Diagnosis not present

## 2021-02-27 DIAGNOSIS — R778 Other specified abnormalities of plasma proteins: Secondary | ICD-10-CM | POA: Diagnosis present

## 2021-02-27 DIAGNOSIS — I429 Cardiomyopathy, unspecified: Secondary | ICD-10-CM | POA: Diagnosis not present

## 2021-02-27 DIAGNOSIS — T82191A Other mechanical complication of cardiac pulse generator (battery), initial encounter: Secondary | ICD-10-CM | POA: Diagnosis not present

## 2021-02-27 DIAGNOSIS — I480 Paroxysmal atrial fibrillation: Secondary | ICD-10-CM | POA: Diagnosis not present

## 2021-02-27 DIAGNOSIS — I5023 Acute on chronic systolic (congestive) heart failure: Secondary | ICD-10-CM | POA: Diagnosis not present

## 2021-02-27 DIAGNOSIS — I340 Nonrheumatic mitral (valve) insufficiency: Secondary | ICD-10-CM | POA: Insufficient documentation

## 2021-02-27 DIAGNOSIS — E782 Mixed hyperlipidemia: Secondary | ICD-10-CM | POA: Diagnosis present

## 2021-02-27 DIAGNOSIS — Z20822 Contact with and (suspected) exposure to covid-19: Secondary | ICD-10-CM | POA: Diagnosis present

## 2021-02-27 DIAGNOSIS — I639 Cerebral infarction, unspecified: Secondary | ICD-10-CM | POA: Diagnosis not present

## 2021-02-27 DIAGNOSIS — Z79899 Other long term (current) drug therapy: Secondary | ICD-10-CM | POA: Insufficient documentation

## 2021-02-27 DIAGNOSIS — I63239 Cerebral infarction due to unspecified occlusion or stenosis of unspecified carotid arteries: Secondary | ICD-10-CM | POA: Diagnosis not present

## 2021-02-27 DIAGNOSIS — R000 Tachycardia, unspecified: Secondary | ICD-10-CM | POA: Diagnosis not present

## 2021-02-27 DIAGNOSIS — F419 Anxiety disorder, unspecified: Secondary | ICD-10-CM | POA: Diagnosis not present

## 2021-02-27 DIAGNOSIS — T82198A Other mechanical complication of other cardiac electronic device, initial encounter: Secondary | ICD-10-CM | POA: Diagnosis present

## 2021-02-27 DIAGNOSIS — I214 Non-ST elevation (NSTEMI) myocardial infarction: Secondary | ICD-10-CM | POA: Diagnosis not present

## 2021-02-27 DIAGNOSIS — E119 Type 2 diabetes mellitus without complications: Secondary | ICD-10-CM | POA: Diagnosis present

## 2021-02-27 DIAGNOSIS — I248 Other forms of acute ischemic heart disease: Secondary | ICD-10-CM | POA: Diagnosis not present

## 2021-02-27 DIAGNOSIS — R0602 Shortness of breath: Secondary | ICD-10-CM | POA: Diagnosis present

## 2021-02-27 DIAGNOSIS — R0689 Other abnormalities of breathing: Secondary | ICD-10-CM | POA: Diagnosis not present

## 2021-02-27 DIAGNOSIS — I25110 Atherosclerotic heart disease of native coronary artery with unstable angina pectoris: Secondary | ICD-10-CM | POA: Diagnosis not present

## 2021-02-27 DIAGNOSIS — Z7984 Long term (current) use of oral hypoglycemic drugs: Secondary | ICD-10-CM | POA: Insufficient documentation

## 2021-02-27 DIAGNOSIS — I5042 Chronic combined systolic (congestive) and diastolic (congestive) heart failure: Secondary | ICD-10-CM | POA: Diagnosis not present

## 2021-02-27 LAB — CUP PACEART INCLINIC DEVICE CHECK
Date Time Interrogation Session: 20220301130321
HighPow Impedance: 50 Ohm
HighPow Impedance: 65 Ohm
Implantable Lead Implant Date: 20081119
Implantable Lead Implant Date: 20081119
Implantable Lead Location: 753859
Implantable Lead Location: 753860
Implantable Lead Model: 185
Implantable Lead Model: 4136
Implantable Lead Serial Number: 211124
Implantable Lead Serial Number: 28383796
Implantable Pulse Generator Implant Date: 20170303
Lead Channel Impedance Value: 583 Ohm
Lead Channel Impedance Value: 627 Ohm
Lead Channel Pacing Threshold Amplitude: 0.6 V
Lead Channel Pacing Threshold Amplitude: 1 V
Lead Channel Pacing Threshold Pulse Width: 0.5 ms
Lead Channel Pacing Threshold Pulse Width: 0.5 ms
Lead Channel Sensing Intrinsic Amplitude: 25 mV
Lead Channel Sensing Intrinsic Amplitude: 4.4 mV
Lead Channel Setting Pacing Amplitude: 2 V
Lead Channel Setting Pacing Amplitude: 2.4 V
Lead Channel Setting Pacing Pulse Width: 0.5 ms
Lead Channel Setting Sensing Sensitivity: 0.6 mV
Pulse Gen Serial Number: 204398

## 2021-02-27 LAB — BASIC METABOLIC PANEL
Anion gap: 13 (ref 5–15)
BUN: 15 mg/dL (ref 6–20)
CO2: 25 mmol/L (ref 22–32)
Calcium: 9.1 mg/dL (ref 8.9–10.3)
Chloride: 98 mmol/L (ref 98–111)
Creatinine, Ser: 1.1 mg/dL — ABNORMAL HIGH (ref 0.44–1.00)
GFR, Estimated: 59 mL/min — ABNORMAL LOW (ref >60)
Glucose, Bld: 152 mg/dL — ABNORMAL HIGH (ref 70–99)
Potassium: 3.8 mmol/L (ref 3.5–5.1)
Sodium: 136 mmol/L (ref 135–145)

## 2021-02-27 LAB — CBC
HCT: 44.6 % (ref 36.0–46.0)
Hemoglobin: 14.6 g/dL (ref 12.0–15.0)
MCH: 29.6 pg (ref 26.0–34.0)
MCHC: 32.7 g/dL (ref 30.0–36.0)
MCV: 90.3 fL (ref 80.0–100.0)
Platelets: 124 10*3/uL — ABNORMAL LOW (ref 150–400)
RBC: 4.94 MIL/uL (ref 3.87–5.11)
RDW: 12.9 % (ref 11.5–15.5)
WBC: 11.5 10*3/uL — ABNORMAL HIGH (ref 4.0–10.5)
nRBC: 0 % (ref 0.0–0.2)

## 2021-02-27 LAB — BRAIN NATRIURETIC PEPTIDE: B Natriuretic Peptide: 320.5 pg/mL — ABNORMAL HIGH (ref 0.0–100.0)

## 2021-02-27 MED ORDER — ISOSORBIDE MONONITRATE ER 30 MG PO TB24: 30.0000 mg | ORAL_TABLET | Freq: Every day | ORAL | 3 refills | Status: DC

## 2021-02-27 NOTE — Patient Instructions (Addendum)
START Imdur 30mg  (1 tablet) daily  Labs today We will only contact you if something comes back abnormal or we need to make some changes. Otherwise no news is good news!  Your physician recommends that you schedule a follow-up appointment in: 1 week  in the Advanced Practitioners (PA/NP) Clinic    If you have any questions or concerns before your next appointment please send a message through [place] or call our office at [phone].    TO LEAVE A MESSAGE FOR THE NURSE SELECT OPTION 2, PLEASE LEAVE A MESSAGE INCLUDING: . YOUR NAME . DATE OF BIRTH . CALL BACK NUMBER . REASON FOR CALL**this is important as we prioritize the call backs  YOU WILL RECEIVE A CALL BACK THE SAME DAY AS LONG AS YOU CALL BEFORE 4:00 PM  Please see our updated No Show and Same Day Appointment Cancellation Policy attached to your AVS.

## 2021-02-27 NOTE — Patient Instructions (Signed)
Medication Instructions:  Your physician recommends that you continue on your current medications as directed. Please refer to the Current Medication list given to you today.  *If you need a refill on your cardiac medications before your next appointment, please call your pharmacy*   Lab Work: None today If you have labs (blood work) drawn today and your tests are completely normal, you will receive your results only by: Marland Kitchen MyChart Message (if you have MyChart) OR . A paper copy in the mail If you have any lab test that is abnormal or we need to change your treatment, we will call you to review the results.   Follow-Up: At [facility], you and your health needs are our priority.  As part of our continuing mission to provide you with exceptional heart care, we have created designated Provider Care Teams.  These Care Teams include your primary Cardiologist (physician) and Advanced Practice Providers (APPs -  Physician Assistants and Nurse Practitioners) who all work together to provide you with the care you need, when you need it.  Your next appointment:   6 month(s)  The format for your next appointment:   In Person  Provider:   You may see Sharrell Ku, MD or one of the following Advanced Practice Providers on your designated Care Team:    [place], PA-C   Jacolyn Reedy, [place]

## 2021-02-27 NOTE — Progress Notes (Signed)
PCP: Ignatius Specking, MD Cardiology: Dr. Diona Browner HF Cardiology: Dr. Shirlee Latch  60 y.o. with history of CVA, anti-phospholipid antibody syndrome, CAD, ischemic cardiomyopathy, and persistent atrial fibrillation was referred by Dr. Diona Browner for evaluation of CHF. Patient had a cath in 2008 in setting of an MI in [place].  Diagonal and LCx were totally occluded, there was diffuse LAD disease.  She has not had a cath since that time. She has a Environmental manager ICD.  Echo in 8/21 showed EF 15-20%, anteroseptal akinesis, normal RV, severe LAE, mild MR. She was admitted in 8/21 with CHF exacerbation for 6 days.  She went into atrial fibrillation in 11/21.   In 12/21, she had TEE-guided DCCV back to NSR.  TEE showed EF 25-30%, mildly decreased RV systolic function, moderate MR.    Later in 12/21, she was admitted with syncope and VT with ICD shock.  RHC/LHC showed chronically occluded D1 and 95% proximal LCx; normal filling pressures with CI 3.0. Platelets were low, no intervention was done. Amiodarone was started.   In 1/22, she was admitted again with VT and presyncope, HS-TnI was negative.  She had PCI to LCx this admission.  She was started on mexiletine in addition to amiodarone.   She was readmitted later in 1/22 with chest pain, HS-TnI was negative and she was discharged.   Had f/u w/ Dr. Shirlee Latch 2/1 and was doing fairly well. No VT. Endorsed frequent but mild self limiting CP but no exertional symptoms. Volume status was ok. He instructed her to increase Entresto to 49-51 bid, however pt has not yet done so as BP has been running soft at home.   She presents to clinic today for f/u. Here w/ her nephew. Had an episode of nitrate responsive CP early this morning. No recurrence but did experience new exertional dyspnea and fatigue earlier today. No resting dyspnea.   She has had no further VT, ICD shocks nor syncope. Had f/u in EP clinic earlier today. No Afib/flutter on device interrogation. Was advised to  continue on mexiletine + amio.   Volume status ok on exam. No recent wt gain.  Wt down 6 lb from previous visit 207>>201 lb today. BP soft, 103/64.     ECG (personally reviewed): NSR, prolonged QT/QTc 422 ms/493 ms   Labs (8/21): K 4.4, creatinine 0.58, LDL 94  Labs (12/21): LDL 72, HDL 31, TGs 85, K 4.4, creatinine 0.8, hgb 14.6, plts 70K Labs (1/22): K 3.3, creatinine 0.94, plts 58K Labs (2/22): creatinine 0.96. K 4.7   PMH: 1. Brain aneurysm: Followed by Dr. Corliss Skains.  2. HTN 3. CVA: Total occlusion of RICA.  - Carotid dopplers (12/21): Occluded RICA, < 50% stenosis LICA.  4. Anti-phospholipid antibody syndrome: Diagnosed at [facility].  - On warfarin and ASA 81 given history of CVA.  5. Remote cocaine abuse.  6. PAD: peripheral arterial dopplers in 3/21 with early left-sided iliac disease, right anterior tibial disease; ABI 1.0 on right and 0.75 on left.  7. Active smoker.  8. L-spine arthritis.  9. CAD: MI in 2008 in [facility], cath at the time showed totally occluded diagonal, totally occluded LCX, and diffuse LAD disease.  No intervention.   - LHC (12/21): chronically occluded D1, 45% mid LAD, 95% proximal LCx.  - PCI to proximal LCx in 1/22.  10. Atrial fibrillation: Persistent, started in 11/21.  - TEE-DCCV back to NSR in 12/21.  11. Chronic systolic CHF: Ischemic cardiomyopathy.  [employer] ICD.  - Echo (8/21):  EF 15-20%, anteroseptal AK, RV normal, severe LAE, mild MR.  - TEE (12/21): EF 25-30%, mildly decreased RV systolic function, moderate MR with ERO 0.26 cm^2 (probably functional).   - RHC (12/21): mean RA 1, PA 24/6, mean PCWP 12, CI 3.0 12. Hyperlipidemia: Statin intolerant.  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   14. Mitral regurgitation: Moderate on 12/21 TEE.  15. Ventricular tachycardia  FH: Father, grandfather with MIs.  Brother with MI in his 54s.   SH: Lives in [place], on disability, smokes 4 cigs/day.  Prior cocaine.  No ETOH.   ROS: All  systems reviewed and negative except as per HPI.   Current Outpatient Medications  Medication Sig Dispense Refill  . amiodarone (PACERONE) 200 MG tablet Take 1 tablet (200 mg total) by mouth daily. 90 tablet 3  . carvedilol (COREG) 6.25 MG tablet Take 1 tablet (6.25 mg total) by mouth 2 (two) times daily with a meal. 60 tablet 5  . clopidogrel (PLAVIX) 75 MG tablet Take 1 tablet (75 mg total) by mouth daily with breakfast. 30 tablet 11  . digoxin 62.5 MCG TABS Take 0.0625 mg by mouth daily. 30 tablet 5  . DULoxetine (CYMBALTA) 30 MG capsule Take 30 mg by mouth daily.    . DULoxetine (CYMBALTA) 60 MG capsule Take 60 mg by mouth every evening.    . empagliflozin (JARDIANCE) 10 MG TABS tablet Take 1 tablet (10 mg total) by mouth daily before breakfast. 30 tablet 3  . ezetimibe (ZETIA) 10 MG tablet Take 1 tablet (10 mg total) by mouth daily. 30 tablet 3  . famotidine (PEPCID) 20 MG tablet Take 1 tablet (20 mg total) by mouth 2 (two) times daily. 60 tablet 1  . fluticasone (FLONASE) 50 MCG/ACT nasal spray Place 1 spray into both nostrils daily as needed for allergies.     Marland Kitchen mexiletine (MEXITIL) 150 MG capsule Take 1 capsule (150 mg total) by mouth every 8 (eight) hours. 90 capsule 5  . Multiple Vitamins-Minerals (MULTIVITAMINS THER. W/MINERALS) TABS Take 1 tablet by mouth daily.      . nitroGLYCERIN (NITROSTAT) 0.4 MG SL tablet PLACE 1 TABLET UNDER THE TONGUE EVERY 5 MINUTES FOR 3 DOSES AS NEEDED CHEST PAIN 25 tablet 3  . oxyCODONE-acetaminophen (PERCOCET) 10-325 MG tablet Take 1 tablet by mouth every 4 (four) hours as needed for pain.    . potassium chloride SA (KLOR-CON) 20 MEQ tablet Take 2 tablets (40 mEq total) by mouth daily. 180 tablet 1  . promethazine (PHENERGAN) 25 MG tablet Take 25 mg by mouth every 6 (six) hours as needed for nausea. With pain medication    . sacubitril-valsartan (ENTRESTO) 24-26 MG Take 1 tablet by mouth 2 (two) times daily.    Marland Kitchen spironolactone (ALDACTONE) 25 MG tablet  Take 1 tablet (25 mg total) by mouth daily. 30 tablet 3  . torsemide (DEMADEX) 20 MG tablet TAKE 4 TABLETS BY MOUTH EVERY DAY. MAY TAKE EXTRA TABLET AS NEEDED 135 tablet 3  . warfarin (COUMADIN) 10 MG tablet TAKE 1/2 TABLET BY MOUTH EVERY DAY EXCEPT 1 TABLET ON SUNDAYS AND THURSDAYS 30 tablet 3   No current facility-administered medications for this encounter.   BP 103/64   Pulse 77   Wt 91.4 kg (201 lb 6.4 oz)   SpO2 98%   BMI 33.00 kg/m   PHYSICAL EXAM: General:  Well appearing. No respiratory difficulty HEENT: normal Neck: supple. no JVD. Carotids 2+ bilat; no bruits. No lymphadenopathy or thyromegaly appreciated. Cor: PMI nondisplaced.  Regular rate & rhythm. No rubs, gallops or murmurs. Lungs: clear Abdomen: soft, nontender, nondistended. No hepatosplenomegaly. No bruits or masses. Good bowel sounds. Extremities: no cyanosis, clubbing, rash, edema Neuro: alert & oriented x 3, cranial nerves grossly intact. moves all 4 extremities w/o difficulty. Affect pleasant.    Assessment/Plan: 1. CAD: MI in 2008 in [facility], cath at the time showed totally occluded diagonal, totally occluded LCX, and diffuse LAD disease.  No intervention. LHC was done in 12/21 due to VT, this showed chronically occluded D1 and 95% proximal LCx.  Initially, no PCI due to thrombocytopenia.  She eventually had PCI to proximal LCx in 1/22.  She has had frequent mild episodes of nitrate responsive chest pain since her PCI. Earlier today, also experienced new exertional dyspnea and fatigue following CP episode. Not volume overloaded on exam. Currently CP free.  - d/w Dr. Shirlee Latch. Will try to optimize medical therapy.  - Add Imdur 30 mg daily w/ close f/u in 1 week. If still w/ anginal symptoms, will plan repeat R/LHC (will need to hold coumadin w/ Lovenox bridge to allow for this).   - Continue Zetia, has not tolerated statins.  Good lipids in 12/21.   - Continue Plavix 75 mg daily x at least 6 months and ideally 1 year. -  She is off ASA given use of warfarin and Plavix.  2. Atrial fibrillation: TEE-guided DCCV to NSR in 12/21.  She is in NSR today on EKG. No breakthrough Afib on device interrogation.  - If atrial fibrillation recurs, will need to consider Tikosyn versus ablation.  - Continue warfarin.   3. Carotid stenosis: Known RICA occlusion. Dopplers in 12/21 with <50% LICA.  - Repeat dopplers in 12/22 4. 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  - She is on warfarin.  5. Chronic systolic CHF: Ischemic cardiomyopathy.  [employer] ICD.  Echo (8/21) with EF 15-20%, anteroseptal AK, RV normal, severe LAE, mild MR.  TEE (12/21) with EF 25-30%.  RHC in 12/21 with preserved cardiac output and optimized filling pressures.  NYHA class II symptoms, not volume overloaded on exam.   - Continue Entresto 24-26 bid (BP too soft for titration)  - Continue Farxiga 10 mg daily.   - Continue spironolactone 25 mg daily.  - Continue Coreg 6.25 mg bid.  - Continue torsemide 80 mg daily - Continue digoxin 0.0625 daily, check digoxin level.  - Adding Imdur 30 (mainly for antianginal benefit) - Check BMP  6. Smoking: Active smoker, wants to stop on her own.  7. PAD: She denies claudication.  - Needs to quit smoking.  8. 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  - notes new exertional fatigue. On coumadin + Plavix - Repeat CBC today to r/o anemia - She will see Dr. Truett Perna (hematology).  9. Mitral regurgitation: Moderate on 12/21 TEE, suspect functional.  10. VT: Suspect scar-mediated in 12/21 with ICD discharge and syncope. She had VT with presyncope in 1/22.  She is now on amiodarone + mexiletine.  - No driving x 6 months.  - Continue mexiletine 150 mg tid - Continue amiodarone 200 mg daily  - EP following.   F/u in 1 week to assess response to Imdur. If still w/ anginal symptomatolgy, will set up for [facility] w/ Dr. Shirlee Latch. Will need to arrange for Lovenox bridge through [facility] clinic  if cath is needed.   Robbie Lis, PA-C  02/27/2021

## 2021-03-01 ENCOUNTER — Encounter (HOSPITAL_COMMUNITY): Payer: Self-pay

## 2021-03-01 ENCOUNTER — Telehealth (HOSPITAL_COMMUNITY): Payer: Self-pay | Admitting: *Deleted

## 2021-03-01 ENCOUNTER — Other Ambulatory Visit: Payer: Self-pay

## 2021-03-01 ENCOUNTER — Inpatient Hospital Stay (HOSPITAL_COMMUNITY)
Admission: EM | Admit: 2021-03-01 | Discharge: 2021-03-15 | DRG: 273 | Disposition: A | Discharge status: Home / Self Care | Payer: Medicare PPO | Attending: Cardiology | Admitting: Cardiology

## 2021-03-01 ENCOUNTER — Emergency Department (HOSPITAL_COMMUNITY): Payer: Medicare PPO

## 2021-03-01 DIAGNOSIS — E782 Mixed hyperlipidemia: Secondary | ICD-10-CM | POA: Diagnosis present

## 2021-03-01 DIAGNOSIS — K5900 Constipation, unspecified: Secondary | ICD-10-CM | POA: Diagnosis present

## 2021-03-01 DIAGNOSIS — R778 Other specified abnormalities of plasma proteins: Secondary | ICD-10-CM | POA: Diagnosis present

## 2021-03-01 DIAGNOSIS — I110 Hypertensive heart disease with heart failure: Secondary | ICD-10-CM | POA: Diagnosis present

## 2021-03-01 DIAGNOSIS — I472 Ventricular tachycardia, unspecified: Secondary | ICD-10-CM

## 2021-03-01 DIAGNOSIS — Z7902 Long term (current) use of antithrombotics/antiplatelets: Secondary | ICD-10-CM

## 2021-03-01 DIAGNOSIS — Z79899 Other long term (current) drug therapy: Secondary | ICD-10-CM

## 2021-03-01 DIAGNOSIS — Z888 Allergy status to other drugs, medicaments and biological substances status: Secondary | ICD-10-CM

## 2021-03-01 DIAGNOSIS — I2510 Atherosclerotic heart disease of native coronary artery without angina pectoris: Secondary | ICD-10-CM | POA: Diagnosis present

## 2021-03-01 DIAGNOSIS — I493 Ventricular premature depolarization: Secondary | ICD-10-CM | POA: Diagnosis present

## 2021-03-01 DIAGNOSIS — I7300 Raynaud's syndrome without gangrene: Secondary | ICD-10-CM | POA: Diagnosis present

## 2021-03-01 DIAGNOSIS — Z955 Presence of coronary angioplasty implant and graft: Secondary | ICD-10-CM

## 2021-03-01 DIAGNOSIS — Z716 Tobacco abuse counseling: Secondary | ICD-10-CM

## 2021-03-01 DIAGNOSIS — I5042 Chronic combined systolic (congestive) and diastolic (congestive) heart failure: Secondary | ICD-10-CM | POA: Diagnosis present

## 2021-03-01 DIAGNOSIS — Z880 Allergy status to penicillin: Secondary | ICD-10-CM

## 2021-03-01 DIAGNOSIS — Z20822 Contact with and (suspected) exposure to covid-19: Secondary | ICD-10-CM | POA: Diagnosis present

## 2021-03-01 DIAGNOSIS — I5023 Acute on chronic systolic (congestive) heart failure: Secondary | ICD-10-CM | POA: Diagnosis not present

## 2021-03-01 DIAGNOSIS — I25119 Atherosclerotic heart disease of native coronary artery with unspecified angina pectoris: Secondary | ICD-10-CM | POA: Diagnosis present

## 2021-03-01 DIAGNOSIS — D6861 Antiphospholipid syndrome: Secondary | ICD-10-CM | POA: Diagnosis present

## 2021-03-01 DIAGNOSIS — I252 Old myocardial infarction: Secondary | ICD-10-CM

## 2021-03-01 DIAGNOSIS — I4819 Other persistent atrial fibrillation: Secondary | ICD-10-CM | POA: Diagnosis present

## 2021-03-01 DIAGNOSIS — Z7984 Long term (current) use of oral hypoglycemic drugs: Secondary | ICD-10-CM

## 2021-03-01 DIAGNOSIS — I255 Ischemic cardiomyopathy: Secondary | ICD-10-CM | POA: Diagnosis present

## 2021-03-01 DIAGNOSIS — D696 Thrombocytopenia, unspecified: Secondary | ICD-10-CM | POA: Diagnosis present

## 2021-03-01 DIAGNOSIS — Z7901 Long term (current) use of anticoagulants: Secondary | ICD-10-CM

## 2021-03-01 DIAGNOSIS — Z4502 Encounter for adjustment and management of automatic implantable cardiac defibrillator: Secondary | ICD-10-CM

## 2021-03-01 DIAGNOSIS — E119 Type 2 diabetes mellitus without complications: Secondary | ICD-10-CM | POA: Diagnosis present

## 2021-03-01 DIAGNOSIS — J309 Allergic rhinitis, unspecified: Secondary | ICD-10-CM | POA: Diagnosis present

## 2021-03-01 DIAGNOSIS — F17210 Nicotine dependence, cigarettes, uncomplicated: Secondary | ICD-10-CM | POA: Diagnosis present

## 2021-03-01 DIAGNOSIS — Z8249 Family history of ischemic heart disease and other diseases of the circulatory system: Secondary | ICD-10-CM

## 2021-03-01 DIAGNOSIS — I6521 Occlusion and stenosis of right carotid artery: Secondary | ICD-10-CM | POA: Diagnosis present

## 2021-03-01 DIAGNOSIS — T424X5A Adverse effect of benzodiazepines, initial encounter: Secondary | ICD-10-CM | POA: Diagnosis not present

## 2021-03-01 DIAGNOSIS — R0602 Shortness of breath: Secondary | ICD-10-CM

## 2021-03-01 DIAGNOSIS — D72829 Elevated white blood cell count, unspecified: Secondary | ICD-10-CM | POA: Diagnosis present

## 2021-03-01 DIAGNOSIS — I248 Other forms of acute ischemic heart disease: Secondary | ICD-10-CM | POA: Diagnosis present

## 2021-03-01 DIAGNOSIS — Y838 Other surgical procedures as the cause of abnormal reaction of the patient, or of later complication, without mention of misadventure at the time of the procedure: Secondary | ICD-10-CM | POA: Diagnosis present

## 2021-03-01 DIAGNOSIS — T82198A Other mechanical complication of other cardiac electronic device, initial encounter: Principal | ICD-10-CM | POA: Diagnosis present

## 2021-03-01 DIAGNOSIS — I69354 Hemiplegia and hemiparesis following cerebral infarction affecting left non-dominant side: Secondary | ICD-10-CM

## 2021-03-01 DIAGNOSIS — E876 Hypokalemia: Secondary | ICD-10-CM | POA: Diagnosis present

## 2021-03-01 DIAGNOSIS — I340 Nonrheumatic mitral (valve) insufficiency: Secondary | ICD-10-CM | POA: Diagnosis present

## 2021-03-01 DIAGNOSIS — Z91018 Allergy to other foods: Secondary | ICD-10-CM

## 2021-03-01 DIAGNOSIS — I2582 Chronic total occlusion of coronary artery: Secondary | ICD-10-CM | POA: Diagnosis present

## 2021-03-01 DIAGNOSIS — R339 Retention of urine, unspecified: Secondary | ICD-10-CM | POA: Diagnosis not present

## 2021-03-01 DIAGNOSIS — F1411 Cocaine abuse, in remission: Secondary | ICD-10-CM | POA: Diagnosis present

## 2021-03-01 DIAGNOSIS — Z95810 Presence of automatic (implantable) cardiac defibrillator: Secondary | ICD-10-CM

## 2021-03-01 DIAGNOSIS — F4310 Post-traumatic stress disorder, unspecified: Secondary | ICD-10-CM | POA: Diagnosis present

## 2021-03-01 DIAGNOSIS — K219 Gastro-esophageal reflux disease without esophagitis: Secondary | ICD-10-CM | POA: Diagnosis present

## 2021-03-01 LAB — BASIC METABOLIC PANEL
Anion gap: 12 (ref 5–15)
BUN: 22 mg/dL — ABNORMAL HIGH (ref 6–20)
CO2: 25 mmol/L (ref 22–32)
Calcium: 8.9 mg/dL (ref 8.9–10.3)
Chloride: 96 mmol/L — ABNORMAL LOW (ref 98–111)
Creatinine, Ser: 1.22 mg/dL — ABNORMAL HIGH (ref 0.44–1.00)
GFR, Estimated: 52 mL/min — ABNORMAL LOW (ref >60)
Glucose, Bld: 102 mg/dL — ABNORMAL HIGH (ref 70–99)
Potassium: 3.4 mmol/L — ABNORMAL LOW (ref 3.5–5.1)
Sodium: 133 mmol/L — ABNORMAL LOW (ref 135–145)

## 2021-03-01 LAB — CBC WITH DIFFERENTIAL/PLATELET
Abs Immature Granulocytes: 0.05 10*3/uL (ref 0.00–0.07)
Basophils Absolute: 0.1 10*3/uL (ref 0.0–0.1)
Basophils Relative: 0 %
Eosinophils Absolute: 0 10*3/uL (ref 0.0–0.5)
Eosinophils Relative: 0 %
HCT: 46.1 % — ABNORMAL HIGH (ref 36.0–46.0)
Hemoglobin: 14.9 g/dL (ref 12.0–15.0)
Immature Granulocytes: 0 %
Lymphocytes Relative: 14 %
Lymphs Abs: 1.9 10*3/uL (ref 0.7–4.0)
MCH: 29 pg (ref 26.0–34.0)
MCHC: 32.3 g/dL (ref 30.0–36.0)
MCV: 89.7 fL (ref 80.0–100.0)
Monocytes Absolute: 1.1 10*3/uL — ABNORMAL HIGH (ref 0.1–1.0)
Monocytes Relative: 8 %
Neutro Abs: 10.3 10*3/uL — ABNORMAL HIGH (ref 1.7–7.7)
Neutrophils Relative %: 78 %
Platelets: 193 10*3/uL (ref 150–400)
RBC: 5.14 MIL/uL — ABNORMAL HIGH (ref 3.87–5.11)
RDW: 12.9 % (ref 11.5–15.5)
WBC: 13.5 10*3/uL — ABNORMAL HIGH (ref 4.0–10.5)
nRBC: 0 % (ref 0.0–0.2)

## 2021-03-01 LAB — PROTIME-INR
INR: 2.1 — ABNORMAL HIGH (ref 0.8–1.2)
Prothrombin Time: 22.9 seconds — ABNORMAL HIGH (ref 11.4–15.2)

## 2021-03-01 LAB — TROPONIN I (HIGH SENSITIVITY): Troponin I (High Sensitivity): 2161 ng/L (ref <18)

## 2021-03-01 LAB — BRAIN NATRIURETIC PEPTIDE: B Natriuretic Peptide: 194 pg/mL — ABNORMAL HIGH (ref 0.0–100.0)

## 2021-03-01 MED ORDER — LORAZEPAM 2 MG/ML IJ SOLN
0.5000 mg | Freq: Once | INTRAMUSCULAR | Status: AC
  Administered 2021-03-01: 0.5 mg via INTRAVENOUS
  Filled 2021-03-01: qty 1

## 2021-03-01 NOTE — Telephone Encounter (Signed)
Pt called stating torsemide does not work as good as lasix for her. Pt said she is still short of breath and that she took extra torsemide as advised but she has low urine output. Pt said she has barely urinated. Pt wants to know if she should change medication because she is retaining fluid.  Routed to [place], [place]

## 2021-03-01 NOTE — ED Triage Notes (Signed)
Pt bib [facility] from home. Pt has had shortness of breath for 4-5 days. Pt had walked from bathroom to kitchen and felt very short of breath, defibrillator fired 4 times while at home. EMS states first SBP=80s, given NS.  Pt A&Ox4 on arrival, no complaints other than shortness of breath at this time.

## 2021-03-02 ENCOUNTER — Ambulatory Visit (INDEPENDENT_AMBULATORY_CARE_PROVIDER_SITE_OTHER): Payer: Medicare PPO

## 2021-03-02 ENCOUNTER — Other Ambulatory Visit: Payer: Self-pay

## 2021-03-02 DIAGNOSIS — F1411 Cocaine abuse, in remission: Secondary | ICD-10-CM | POA: Diagnosis present

## 2021-03-02 DIAGNOSIS — I5042 Chronic combined systolic (congestive) and diastolic (congestive) heart failure: Secondary | ICD-10-CM | POA: Diagnosis not present

## 2021-03-02 DIAGNOSIS — I2510 Atherosclerotic heart disease of native coronary artery without angina pectoris: Secondary | ICD-10-CM | POA: Diagnosis present

## 2021-03-02 DIAGNOSIS — F419 Anxiety disorder, unspecified: Secondary | ICD-10-CM | POA: Diagnosis not present

## 2021-03-02 DIAGNOSIS — I472 Ventricular tachycardia, unspecified: Secondary | ICD-10-CM

## 2021-03-02 DIAGNOSIS — I25119 Atherosclerotic heart disease of native coronary artery with unspecified angina pectoris: Secondary | ICD-10-CM | POA: Diagnosis present

## 2021-03-02 DIAGNOSIS — Y838 Other surgical procedures as the cause of abnormal reaction of the patient, or of later complication, without mention of misadventure at the time of the procedure: Secondary | ICD-10-CM | POA: Diagnosis present

## 2021-03-02 DIAGNOSIS — R778 Other specified abnormalities of plasma proteins: Secondary | ICD-10-CM | POA: Diagnosis present

## 2021-03-02 DIAGNOSIS — E119 Type 2 diabetes mellitus without complications: Secondary | ICD-10-CM | POA: Diagnosis present

## 2021-03-02 DIAGNOSIS — I25110 Atherosclerotic heart disease of native coronary artery with unstable angina pectoris: Secondary | ICD-10-CM | POA: Diagnosis not present

## 2021-03-02 DIAGNOSIS — I480 Paroxysmal atrial fibrillation: Secondary | ICD-10-CM | POA: Diagnosis not present

## 2021-03-02 DIAGNOSIS — I110 Hypertensive heart disease with heart failure: Secondary | ICD-10-CM | POA: Diagnosis present

## 2021-03-02 DIAGNOSIS — I63239 Cerebral infarction due to unspecified occlusion or stenosis of unspecified carotid arteries: Secondary | ICD-10-CM | POA: Diagnosis not present

## 2021-03-02 DIAGNOSIS — E782 Mixed hyperlipidemia: Secondary | ICD-10-CM | POA: Diagnosis present

## 2021-03-02 DIAGNOSIS — R0602 Shortness of breath: Secondary | ICD-10-CM | POA: Diagnosis present

## 2021-03-02 DIAGNOSIS — Z20822 Contact with and (suspected) exposure to covid-19: Secondary | ICD-10-CM | POA: Diagnosis present

## 2021-03-02 DIAGNOSIS — K5900 Constipation, unspecified: Secondary | ICD-10-CM | POA: Diagnosis present

## 2021-03-02 DIAGNOSIS — I4819 Other persistent atrial fibrillation: Secondary | ICD-10-CM | POA: Diagnosis present

## 2021-03-02 DIAGNOSIS — I429 Cardiomyopathy, unspecified: Secondary | ICD-10-CM | POA: Diagnosis not present

## 2021-03-02 DIAGNOSIS — I5023 Acute on chronic systolic (congestive) heart failure: Secondary | ICD-10-CM | POA: Diagnosis not present

## 2021-03-02 DIAGNOSIS — F17210 Nicotine dependence, cigarettes, uncomplicated: Secondary | ICD-10-CM | POA: Diagnosis present

## 2021-03-02 DIAGNOSIS — I248 Other forms of acute ischemic heart disease: Secondary | ICD-10-CM | POA: Diagnosis present

## 2021-03-02 DIAGNOSIS — I214 Non-ST elevation (NSTEMI) myocardial infarction: Secondary | ICD-10-CM | POA: Diagnosis not present

## 2021-03-02 DIAGNOSIS — R339 Retention of urine, unspecified: Secondary | ICD-10-CM | POA: Diagnosis not present

## 2021-03-02 DIAGNOSIS — I5043 Acute on chronic combined systolic (congestive) and diastolic (congestive) heart failure: Secondary | ICD-10-CM | POA: Diagnosis not present

## 2021-03-02 DIAGNOSIS — I4891 Unspecified atrial fibrillation: Secondary | ICD-10-CM | POA: Diagnosis not present

## 2021-03-02 DIAGNOSIS — D6861 Antiphospholipid syndrome: Secondary | ICD-10-CM | POA: Diagnosis present

## 2021-03-02 DIAGNOSIS — I5022 Chronic systolic (congestive) heart failure: Secondary | ICD-10-CM | POA: Diagnosis not present

## 2021-03-02 DIAGNOSIS — D696 Thrombocytopenia, unspecified: Secondary | ICD-10-CM | POA: Diagnosis present

## 2021-03-02 DIAGNOSIS — T82191A Other mechanical complication of cardiac pulse generator (battery), initial encounter: Secondary | ICD-10-CM | POA: Diagnosis not present

## 2021-03-02 DIAGNOSIS — T82198A Other mechanical complication of other cardiac electronic device, initial encounter: Secondary | ICD-10-CM | POA: Diagnosis present

## 2021-03-02 DIAGNOSIS — I639 Cerebral infarction, unspecified: Secondary | ICD-10-CM | POA: Diagnosis not present

## 2021-03-02 DIAGNOSIS — I255 Ischemic cardiomyopathy: Secondary | ICD-10-CM | POA: Diagnosis present

## 2021-03-02 DIAGNOSIS — I252 Old myocardial infarction: Secondary | ICD-10-CM | POA: Diagnosis not present

## 2021-03-02 DIAGNOSIS — I69354 Hemiplegia and hemiparesis following cerebral infarction affecting left non-dominant side: Secondary | ICD-10-CM | POA: Diagnosis not present

## 2021-03-02 DIAGNOSIS — I2582 Chronic total occlusion of coronary artery: Secondary | ICD-10-CM | POA: Diagnosis present

## 2021-03-02 DIAGNOSIS — I6521 Occlusion and stenosis of right carotid artery: Secondary | ICD-10-CM | POA: Diagnosis present

## 2021-03-02 DIAGNOSIS — J309 Allergic rhinitis, unspecified: Secondary | ICD-10-CM | POA: Diagnosis present

## 2021-03-02 LAB — RESP PANEL BY RT-PCR (FLU A&B, COVID) ARPGX2
Influenza A by PCR: NEGATIVE
Influenza B by PCR: NEGATIVE
SARS Coronavirus 2 by RT PCR: NEGATIVE

## 2021-03-02 LAB — TROPONIN I (HIGH SENSITIVITY): Troponin I (High Sensitivity): 3070 ng/L (ref <18)

## 2021-03-02 LAB — MAGNESIUM
Magnesium: 2.2 mg/dL (ref 1.7–2.4)
Magnesium: 2.2 mg/dL (ref 1.7–2.4)

## 2021-03-02 LAB — PROTIME-INR
INR: 2.7 — ABNORMAL HIGH (ref 0.8–1.2)
INR: 2.9 — ABNORMAL HIGH (ref 0.8–1.2)
Prothrombin Time: 27.7 seconds — ABNORMAL HIGH (ref 11.4–15.2)
Prothrombin Time: 29.3 seconds — ABNORMAL HIGH (ref 11.4–15.2)

## 2021-03-02 LAB — DIGOXIN LEVEL: Digoxin Level: 0.7 ng/mL — ABNORMAL LOW (ref 0.8–2.0)

## 2021-03-02 LAB — MRSA PCR SCREENING: MRSA by PCR: NEGATIVE

## 2021-03-02 MED ORDER — POTASSIUM CHLORIDE CRYS ER 10 MEQ PO TBCR
40.0000 meq | EXTENDED_RELEASE_TABLET | Freq: Once | ORAL | Status: AC
  Administered 2021-03-02: 40 meq via ORAL
  Filled 2021-03-02: qty 4

## 2021-03-02 MED ORDER — SPIRONOLACTONE 25 MG PO TABS
25.0000 mg | ORAL_TABLET | Freq: Every day | ORAL | Status: DC
  Administered 2021-03-02 – 2021-03-13 (×11): 25 mg via ORAL
  Filled 2021-03-02 (×12): qty 1

## 2021-03-02 MED ORDER — AMIODARONE HCL 200 MG PO TABS
400.0000 mg | ORAL_TABLET | Freq: Two times a day (BID) | ORAL | Status: DC
  Administered 2021-03-02 – 2021-03-13 (×22): 400 mg via ORAL
  Filled 2021-03-02 (×23): qty 2

## 2021-03-02 MED ORDER — ACETAMINOPHEN 325 MG PO TABS
650.0000 mg | ORAL_TABLET | ORAL | Status: DC | PRN
  Administered 2021-03-02: 650 mg via ORAL
  Filled 2021-03-02: qty 2

## 2021-03-02 MED ORDER — OXYCODONE-ACETAMINOPHEN 5-325 MG PO TABS
1.0000 | ORAL_TABLET | ORAL | Status: DC | PRN
  Administered 2021-03-02 – 2021-03-14 (×38): 1 via ORAL
  Filled 2021-03-02 (×40): qty 1

## 2021-03-02 MED ORDER — CLOPIDOGREL BISULFATE 75 MG PO TABS
75.0000 mg | ORAL_TABLET | Freq: Every day | ORAL | Status: DC
  Administered 2021-03-02 – 2021-03-07 (×6): 75 mg via ORAL
  Filled 2021-03-02 (×6): qty 1

## 2021-03-02 MED ORDER — MEXILETINE HCL 150 MG PO CAPS
150.0000 mg | ORAL_CAPSULE | Freq: Three times a day (TID) | ORAL | Status: DC
  Administered 2021-03-02 – 2021-03-09 (×22): 150 mg via ORAL
  Filled 2021-03-02 (×25): qty 1

## 2021-03-02 MED ORDER — SODIUM CHLORIDE 0.9% FLUSH
3.0000 mL | Freq: Two times a day (BID) | INTRAVENOUS | Status: DC
  Administered 2021-03-08 – 2021-03-11 (×5): 3 mL via INTRAVENOUS

## 2021-03-02 MED ORDER — PROCHLORPERAZINE MALEATE 10 MG PO TABS
10.0000 mg | ORAL_TABLET | Freq: Three times a day (TID) | ORAL | Status: DC | PRN
  Administered 2021-03-03 – 2021-03-07 (×9): 10 mg via ORAL
  Filled 2021-03-02 (×12): qty 1

## 2021-03-02 MED ORDER — NITROGLYCERIN 0.4 MG SL SUBL
0.4000 mg | SUBLINGUAL_TABLET | SUBLINGUAL | Status: DC | PRN
  Administered 2021-03-12 (×2): 0.4 mg via SUBLINGUAL
  Filled 2021-03-02 (×2): qty 1

## 2021-03-02 MED ORDER — ONDANSETRON HCL 4 MG/2ML IJ SOLN: 4.0000 mg | Freq: Four times a day (QID) | INTRAMUSCULAR | Status: DC | PRN

## 2021-03-02 MED ORDER — AMIODARONE HCL 200 MG PO TABS
200.0000 mg | ORAL_TABLET | Freq: Every day | ORAL | Status: DC
  Administered 2021-03-02: 200 mg via ORAL
  Filled 2021-03-02: qty 1

## 2021-03-02 MED ORDER — EZETIMIBE 10 MG PO TABS
10.0000 mg | ORAL_TABLET | Freq: Every day | ORAL | Status: DC
  Administered 2021-03-02 – 2021-03-13 (×11): 10 mg via ORAL
  Filled 2021-03-02 (×12): qty 1

## 2021-03-02 MED ORDER — ISOSORBIDE MONONITRATE ER 30 MG PO TB24
30.0000 mg | ORAL_TABLET | Freq: Every day | ORAL | Status: DC
  Administered 2021-03-02 – 2021-03-13 (×11): 30 mg via ORAL
  Filled 2021-03-02 (×11): qty 1

## 2021-03-02 MED ORDER — MELATONIN 3 MG PO TABS
3.0000 mg | ORAL_TABLET | Freq: Every day | ORAL | Status: DC
  Administered 2021-03-02 – 2021-03-14 (×13): 3 mg via ORAL
  Filled 2021-03-02 (×14): qty 1

## 2021-03-02 MED ORDER — SACUBITRIL-VALSARTAN 24-26 MG PO TABS
1.0000 | ORAL_TABLET | Freq: Two times a day (BID) | ORAL | Status: DC
  Administered 2021-03-02 – 2021-03-15 (×25): 1 via ORAL
  Filled 2021-03-02 (×29): qty 1

## 2021-03-02 MED ORDER — POTASSIUM CHLORIDE CRYS ER 20 MEQ PO TBCR
40.0000 meq | EXTENDED_RELEASE_TABLET | Freq: Once | ORAL | Status: AC
  Administered 2021-03-02: 40 meq via ORAL
  Filled 2021-03-02: qty 2

## 2021-03-02 MED ORDER — OXYCODONE-ACETAMINOPHEN 5-325 MG PO TABS
1.0000 | ORAL_TABLET | Freq: Once | ORAL | Status: AC
  Administered 2021-03-02: 1 via ORAL
  Filled 2021-03-02: qty 1

## 2021-03-02 MED ORDER — OXYCODONE HCL 5 MG PO TABS
5.0000 mg | ORAL_TABLET | ORAL | Status: DC | PRN
  Administered 2021-03-02 – 2021-03-15 (×27): 5 mg via ORAL
  Filled 2021-03-02 (×28): qty 1

## 2021-03-02 MED ORDER — CARVEDILOL 6.25 MG PO TABS
6.2500 mg | ORAL_TABLET | Freq: Two times a day (BID) | ORAL | Status: DC
  Administered 2021-03-02 – 2021-03-10 (×15): 6.25 mg via ORAL
  Filled 2021-03-02 (×15): qty 1

## 2021-03-02 NOTE — H&P (Signed)
Cardiology Admission History and Physical:   Patient ID: [name] [name]; MRN: [id number]; DOB: 10-04-1961   Admission date: 03/01/2021  Primary Care Provider: Ignatius Specking, MD Primary Cardiologist: Nona Dell, MD    Chief Complaint:  Shortness of breath and ICD shocks x4  History of Present Illness:   [name] [name] is a 60 y.o. female with a history of CAD (PCI of LCx in Jan 2022), ICM (s/p [employer] dual chamber ICD implant 2017), cocaine abuse (in remission), CVA, AFIB, antiphospholipid syndrome (on Coumadin), who presented to the hospital for worsening SOB and 4 shocks from her ICD.  The episodes occurred around 7 PM last night.  In quick succession over 15 to 20 minutes she had 4 shocks.  She describes some soreness of the chest afterwards.  She did not have any pressure-like chest discomfort prior to the ICD delivering therapy.  She has been increasingly dyspneic over the past 1 week.  She increased her daily dose of torsemide without much improvement in her symptoms.  She has difficulty sleeping at night because of the shortness of breath.  She has chronic orthopnea.  She denies any significant lower extremity edema.  She is barely able to walk 10 to 15 feet before developing symptoms.  Reports compliance with her clopidogrel and Coumadin.  In the emergency department device interrogation showed appropriate ICD shocks for VT VF.  The labs were as follows: BNP 194, high-sensitivity troponin 2161, K 3.4, Cr 1.22, BUN 22, WBC 13.5, and INR 2.1.  The chest x-ray was unremarkable. The ECG showed sinus rhythm, IVCD and slightly prolonged QTc (494 ms).    Past Medical History:  Diagnosis Date  . Aneurysm of internal carotid artery   . Anticardiolipin antibody positive    Chronic Coumadin  . Brain aneurysm    followed by Dr. Corliss Skains  . Chronic combined systolic (congestive) and diastolic (congestive) heart failure (HCC)   . Cocaine abuse in remission (HCC)   . Coronary  atherosclerosis of native coronary artery    Previous invasive cardiac testing was done at a facility in [place], [place].  Occluded diagonal, Diffuse LAD disease, Occluded Cx, and non obstructive RCA.   . Essential hypertension   . Headache   . History of pneumonia   . ICD (implantable cardioverter-defibrillator) in place   . Ischemic cardiomyopathy    LVEF 30-35%  . Mixed hyperlipidemia   . NSVT (nonsustained ventricular tachycardia) (HCC)   . Persistent atrial fibrillation (HCC)   . PVD (peripheral vascular disease) (HCC)   . Raynaud's phenomenon   . Statin intolerance   . Stroke [facility]) 2007   Total occlusion of the right internal carotid    Past Surgical History:  Procedure Laterality Date  . ABDOMINAL AORTAGRAM N/A 05/25/2014   Procedure: ABDOMINAL Ronny Flurry;  Surgeon: Iran Ouch, MD;  Location: [facility] CATH LAB;  Service: Cardiovascular;  Laterality: N/A;  . APPENDECTOMY    . CARDIAC DEFIBRILLATOR PLACEMENT     [employer] ICD  . CARDIOVERSION N/A 12/01/2020   Procedure: CARDIOVERSION;  Surgeon: [name], MD;  Location: [facility] ENDOSCOPY;  Service: Cardiovascular;  Laterality: N/A;  . CORONARY STENT INTERVENTION N/A 01/08/2021   Procedure: CORONARY STENT INTERVENTION;  Surgeon: Swazil[place], Peter M, MD;  Location: [facility] INVASIVE CV LAB;  Service: Cardiovascular;  Laterality: N/A;  . EP IMPLANTABLE DEVICE N/A 03/01/2016   Procedure: ICD Generator Changeout;  Surgeon: [name], MD;  Location: [facility] INVASIVE CV LAB;  Service: Cardiovascular;  Laterality:  N/A;  . L1 corpectomy   12/10  . Laryngeal polyp excision    . RIGHT/LEFT HEART CATH AND CORONARY ANGIOGRAPHY N/A 12/18/2020   Procedure: RIGHT/LEFT HEART CATH AND CORONARY ANGIOGRAPHY;  Surgeon: Dolores Patty, MD;  Location: [facility] INVASIVE CV LAB;  Service: Cardiovascular;  Laterality: N/A;  . TEE WITHOUT CARDIOVERSION N/A 12/01/2020   Procedure: TRANSESOPHAGEAL ECHOCARDIOGRAM (TEE);  Surgeon: [name], MD;  [facility];  Service: Cardiovascular;  Laterality: N/A;     Medications Prior to Admission: Prior to Admission medications   Medication Sig Start Date End Date Taking? Authorizing Provider  amiodarone (PACERONE) 200 MG tablet Take 1 tablet (200 mg total) by mouth daily. 01/30/21   [name], MD  carvedilol (COREG) 6.25 MG tablet Take 1 tablet (6.25 mg total) by mouth 2 (two) times daily with a meal. 12/19/20   Allayne Butcher, PA-C  clopidogrel (PLAVIX) 75 MG tablet Take 1 tablet (75 mg total) by mouth daily with breakfast. 01/10/21   Allayne Butcher, PA-C  digoxin 62.5 MCG TABS Take 0.0625 mg by mouth daily. 01/10/21   Robbie Lis M, PA-C  DULoxetine (CYMBALTA) 30 MG capsule Take 30 mg by mouth daily.    [provider]  DULoxetine (CYMBALTA) 60 MG capsule Take 60 mg by mouth every evening.    [provider]  empagliflozin (JARDIANCE) 10 MG TABS tablet Take 1 tablet (10 mg total) by mouth daily before breakfast. 01/03/21   [name], MD  ezetimibe (ZETIA) 10 MG tablet Take 1 tablet (10 mg total) by mouth daily. 08/16/20 11/17/21  Sherryll Burger, Pratik D, DO  famotidine (PEPCID) 20 MG tablet Take 1 tablet (20 mg total) by mouth 2 (two) times daily. 01/06/19 08/25/21  Kari Baars, MD  fluticasone (FLONASE) 50 MCG/ACT nasal spray Place 1 spray into both nostrils daily as needed for allergies.  09/24/19   [provider]  isosorbide mononitrate (IMDUR) 30 MG 24 hr tablet Take 1 tablet (30 mg total) by mouth daily. 02/27/21   Robbie Lis M, PA-C  mexiletine (MEXITIL) 150 MG capsule Take 1 capsule (150 mg total) by mouth every 8 (eight) hours. 01/09/21   [name], MD  Multiple Vitamins-Minerals (MULTIVITAMINS THER. W/MINERALS) TABS Take 1 tablet by mouth daily.      [provider]  nitroGLYCERIN (NITROSTAT) 0.4 MG SL tablet PLACE 1 TABLET UNDER THE TONGUE EVERY 5 MINUTES FOR 3 DOSES AS NEEDED CHEST PAIN 01/25/21   [name], MD   oxyCODONE-acetaminophen (PERCOCET) 10-325 MG tablet Take 1 tablet by mouth every 4 (four) hours as needed for pain.    [provider]  potassium chloride SA (KLOR-CON) 20 MEQ tablet Take 2 tablets (40 mEq total) by mouth daily. 02/12/21   [name], MD  promethazine (PHENERGAN) 25 MG tablet Take 25 mg by mouth every 6 (six) hours as needed for nausea. With pain medication    [provider]  sacubitril-valsartan (ENTRESTO) 24-26 MG Take 1 tablet by mouth 2 (two) times daily.    [provider]  spironolactone (ALDACTONE) 25 MG tablet Take 1 tablet (25 mg total) by mouth daily. 11/28/20   [name], MD  torsemide (DEMADEX) 20 MG tablet TAKE 4 TABLETS BY MOUTH EVERY DAY. MAY TAKE EXTRA TABLET AS NEEDED 01/23/21   [name], MD  warfarin (COUMADIN) 10 MG tablet TAKE 1/2 TABLET BY MOUTH EVERY DAY EXCEPT 1 TABLET ON SUNDAYS AND THURSDAYS 02/12/21  Jonelle SidleMcDowell, Samuel G, MD     Allergies:    Allergies  Allergen Reactions  . Beef-Derived Products Anaphylaxis    From tick bite  . Metoclopramide Hcl Anaphylaxis    Non responsive  . Penicillins Anaphylaxis    Pt states she can take any cephalosporin (per pt. 01/01/19) Shock Has patient had a PCN reaction causing immediate rash, facial/tongue/throat swelling, SOB or lightheadedness with hypotension: Yes Has patient had a PCN reaction causing severe rash involving mucus membranes or skin necrosis: No Has patient had a PCN reaction that required hospitalization Yes Has patient had a PCN reaction occurring within the last 10 years: No If all of the above answers are "NO", then may proceed with Cephalosporin   . Pork-Derived Products Anaphylaxis    From tick bite. Has tolerated heparin/lovenox  . Metoprolol Other (See Comments)    Syncope   . Reglan [Metoclopramide]   . Statins Other (See Comments)    Myalgias    Social History:   Social History   Socioeconomic History  . Marital status: Single     Spouse name: Not on file  . Number of children: Not on file  . Years of education: Not on file  . Highest education level: Not on file  Occupational History  . Occupation: Disabled    Comment: ESL  Tobacco Use  . Smoking status: Current Every Day Smoker    Packs/day: 1.00    Years: 43.00    Pack years: 43.00    Types: Cigarettes    Start date: 03/03/1978  . Smokeless tobacco: Never Used  . Tobacco comment: 4 ciggs per day  Vaping Use  . Vaping Use: Every day  Substance and Sexual Activity  . Alcohol use: No    Alcohol/week: 0.0 standard drinks  . Drug use: No    Comment: Prior history of cocaine  . Sexual activity: Yes    Birth control/protection: None  Other Topics Concern  . Not on file  Social History Narrative  . Not on file   Social Determinants of Health   Financial Resource Strain: Not on file  Food Insecurity: Not on file  Transportation Needs: Not on file  Physical Activity: Not on file  Stress: Not on file  Social Connections: Not on file  Intimate Partner Violence: Not on file     Family History:   The patient's family history includes Ankylosing spondylitis in her sister; Heart attack in her brother; Heart disease (age of onset: 3785) in her father; Stomach cancer (age of onset: 7158) in her brother.     Review of Systems: [y] = yes, [ ]  = no   . General: Weight gain [ ] ; Weight loss [ ] ; Anorexia [ ] ; Fatigue [ ] ; Fever [ ] ; Chills [ ] ; Weakness [ ]   . Cardiac: Chest pain/pressure [ ] ; Resting SOB [ ] ; Exertional SOB [Y]; Orthopnea [ ] ; Pedal Edema [ ] ; Palpitations [ ] ; Syncope [ ] ; Presyncope [ ] ; Paroxysmal nocturnal dyspnea[ ]   . Pulmonary: Cough [ ] ; Wheezing[ ] ; Hemoptysis[ ] ; Sputum [ ] ; Snoring [ ]   . GI: Vomiting[ ] ; Dysphagia[ ] ; Melena[ ] ; Hematochezia [ ] ; Heartburn[ ] ; Abdominal pain [ ] ; Constipation [ ] ; Diarrhea [ ] ; BRBPR [ ]   . GU: Hematuria[ ] ; Dysuria [ ] ; Nocturia[ ]   . Vascular: Pain in legs with walking [ ] ; Pain in feet with lying  flat [ ] ; Non-healing sores [ ] ; Stroke [ ] ; TIA [ ] ; Slurred speech [ ] ;  .  Neuro: Headaches[ ] ; Vertigo[ ] ; Seizures[ ] ; Paresthesias[ ] ;Blurred vision [ ] ; Diplopia [ ] ; Vision changes [ ]   . Ortho/Skin: Arthritis [ ] ; Joint pain [ ] ; Muscle pain [ ] ; Joint swelling [ ] ; Back Pain [ ] ; Rash [ ]   . Psych: Depression[ ] ; Anxiety[ ]   . Heme: Bleeding problems [ ] ; Clotting disorders [ ] ; Anemia [ ]   . Endocrine: Diabetes [ ] ; Thyroid dysfunction[ ]      Physical Exam/Data:   Vitals:   03/01/21 2230 03/01/21 2245 03/01/21 2300 03/01/21 2315  BP: 117/68 114/72 110/68 (!) 97/58  Pulse: 78 80 77 77  Resp: 17 18 17  (!) 26  Temp:      TempSrc:      SpO2: 100% 96% 96% 97%  Weight:      Height:       No intake or output data in the 24 hours ending 03/02/21 0004 Filed Weights   03/01/21 2051  Weight: 86.2 kg   Body mass index is 31.62 kg/m.  General:  Well nourished, well developed, in no acute distress HEENT: normal Lymph: no adenopathy Neck: no JVD Endocrine:  No thryomegaly Vascular: No carotid bruits; FA pulses 2+ bilaterally without bruits  Cardiac:  normal S1, S2; RRR; no murmur  Lungs:  clear to auscultation bilaterally, no wheezing, rhonchi or rales  Abd: soft, nontender, no hepatomegaly  Ext: no edema Musculoskeletal:  No deformities, BUE and BLE strength normal and equal Skin: warm and dry  Neuro:  CNs 2-12 intact, no focal abnormalities noted Psych:  Normal affect    Laboratory Data:  Chemistry Recent Labs  Lab 02/27/21 1605 03/01/21 2125  NA 136 133*  K 3.8 3.4*  CL 98 96*  CO2 25 25  GLUCOSE 152* 102*  BUN 15 22*  CREATININE 1.10* 1.22*  CALCIUM 9.1 8.9  GFRNONAA 59* 52*  ANIONGAP 13 12    No results for input(s): PROT, ALBUMIN, AST, ALT, ALKPHOS, BILITOT in the last 168 hours. Hematology Recent Labs  Lab 02/27/21 1605 03/01/21 2125  WBC 11.5* 13.5*  RBC 4.94 5.14*  HGB 14.6 14.9  HCT 44.6 46.1*  MCV 90.3 89.7  MCH 29.6 29.0  MCHC 32.7 32.3   RDW 12.9 12.9  PLT 124* 193   Cardiac EnzymesNo results for input(s): TROPONINI in the last 168 hours. No results for input(s): TROPIPOC in the last 168 hours.  BNP Recent Labs  Lab 02/27/21 1605 03/01/21 2125  BNP 320.5* 194.0*    DDimer No results for input(s): DDIMER in the last 168 hours.  Radiology/Studies:  DG Chest Port 1 View  Result Date: 03/01/2021 CLINICAL DATA:  Shortness of breath for 4-5 days. EXAM: PORTABLE CHEST 1 VIEW COMPARISON:  Chest radiograph 01/11/2021 FINDINGS: Stable cardiomediastinal contours with enlarged heart size. Aortic arch calcification. Left chest pacer appears unchanged in position. Stable scarring in the left mid lung. No new focal opacity. No pneumothorax or significant pleural effusion. No acute finding in the visualized skeleton. IMPRESSION: No acute cardiopulmonary finding. Electronically Signed   By: Emmaline Kluver M.D.   On: 03/01/2021 21:52    Assessment and Plan:   1.  ICD shocks Device interrogation in the ED documented VT/VF and appropriate defibrillation x 4. Initial high sensitivity troponin 2161.   -Continue oral amiodarone. Mexilitine can be resumed in the morning if okayed by EP/CHF services -Continue BB  2. Worsening shortness of breath / HFrEF The patient has a history of chronic systolic heart failure  -Strict I and  Os -Daily weights -Continue carvedilol -Check Dig level -Continue Entresto -Continue Aldactone -Continue diruetics as needed (IV/PO)  3. Elevated troponin / known CAD  -Continue Clopidogrel -NTG as needed if patient has chest pain -Hold coumadin for now in case patient needs a diagnostic cath. Start IV heparin once INR <2.0 -Continue Zetia -Will need ischemic evaluation (? Repeat cath on this admission)   Severity of Illness: The appropriate patient status for this patient is INPATIENT. Inpatient status is judged to be reasonable and necessary in order to provide the required intensity of service to  ensure the patient's safety. The patient's presenting symptoms, physical exam findings, and initial radiographic and laboratory data in the context of their chronic comorbidities is felt to place them at high risk for further clinical deterioration. Furthermore, it is not anticipated that the patient will be medically stable for discharge from the hospital within 2 midnights of admission. The following factors support the patient status of inpatient.   " The patient's presenting symptoms include chest discomfort. " The worrisome physical exam findings include NA. " The initial radiographic and laboratory data are worrisome because of abnormal troponin. " The chronic co-morbidities include AFIB/CHF.   * I certify that at the point of admission it is my clinical judgment that the patient will require inpatient hospital care spanning beyond 2 midnights from the point of admission due to high intensity of service, high risk for further deterioration and high frequency of surveillance required.*    For questions or updates, please contact [facility] Please consult [email or web address] for contact info under Cardiology/STEMI.    Signed, Lonie Peak, MD  03/02/2021 12:04 AM

## 2021-03-02 NOTE — ED Notes (Signed)
Placed pt on hospital bed for comfort. 

## 2021-03-02 NOTE — Progress Notes (Addendum)
Advanced Heart Failure Rounding Note  PCP-Cardiologist: Nona Dell, MD   Subjective:    Admitted by fellow overnight for ICD shocks x 4 for VT. Has had recent nitrate responsive CP. Has ruled in for NSTEMI.   Hs Trop 2,161>>3,070. EKG no acute ST changes. QT/QTc 442/494 ms  K 3.4  Mg 2.2  Dig level 0.7   On Coumadin for anti-phospholipid antibody syndrome. Took dose last PM. INR 2.1 on admit.   Currently NSR w/ occasionally PVCs/ brief NSVT on tele. No current CP or dyspnea. Feels ok.   Objective:   Weight Range: 86.2 kg Body mass index is 31.62 kg/m.   Vital Signs:   Temp:  [97.5 F (36.4 C)-98.2 F (36.8 C)] 97.5 F (36.4 C) (03/04 0459) Pulse Rate:  [75-88] 77 (03/04 0459) Resp:  [11-26] 18 (03/04 0459) BP: (90-127)/(58-78) 95/66 (03/04 0459) SpO2:  [94 %-100 %] 94 % (03/04 0459) Weight:  [86.2 kg] 86.2 kg (03/03 2051)    Weight change: Filed Weights   03/01/21 2051  Weight: 86.2 kg    Intake/Output:  No intake or output data in the 24 hours ending 03/02/21 0750    Physical Exam    General:  Well appearing. No resp difficulty HEENT: Normal Neck: Supple. JVP . Carotids 2+ bilat; no bruits. No lymphadenopathy or thyromegaly appreciated. Cor: PMI nondisplaced. Regular rate & rhythm. No rubs, gallops or murmurs. Lungs: Clear Abdomen: Soft, nontender, nondistended. No hepatosplenomegaly. No bruits or masses. Good bowel sounds. Extremities: No cyanosis, clubbing, rash, edema Neuro: Alert & orientedx3, cranial nerves grossly intact. moves all 4 extremities w/o difficulty. Affect pleasant   Telemetry   NSR w/ brief NSVT and PVCs   EKG    NSR. No acute ST changes. QT/QTc 442/494 ms  Labs    CBC Recent Labs    02/27/21 1605 03/01/21 2125  WBC 11.5* 13.5*  NEUTROABS  --  10.3*  HGB 14.6 14.9  HCT 44.6 46.1*  MCV 90.3 89.7  PLT 124* 193   Basic Metabolic Panel Recent Labs    40/98/11 1605 03/01/21 2125 03/02/21 0034 03/02/21 0444   NA 136 133*  --   --   K 3.8 3.4*  --   --   CL 98 96*  --   --   CO2 25 25  --   --   GLUCOSE 152* 102*  --   --   BUN 15 22*  --   --   CREATININE 1.10* 1.22*  --   --   CALCIUM 9.1 8.9  --   --   MG  --   --  2.2 2.2   Liver Function Tests No results for input(s): AST, ALT, ALKPHOS, BILITOT, PROT, ALBUMIN in the last 72 hours. No results for input(s): LIPASE, AMYLASE in the last 72 hours. Cardiac Enzymes No results for input(s): CKTOTAL, CKMB, CKMBINDEX, TROPONINI in the last 72 hours.  BNP: BNP (last 3 results) Recent Labs    12/14/20 1921 02/27/21 1605 03/01/21 2125  BNP 209.0* 320.5* 194.0*    ProBNP (last 3 results) No results for input(s): PROBNP in the last 8760 hours.   D-Dimer No results for input(s): DDIMER in the last 72 hours. Hemoglobin A1C No results for input(s): HGBA1C in the last 72 hours. Fasting Lipid Panel No results for input(s): CHOL, HDL, LDLCALC, TRIG, CHOLHDL, LDLDIRECT in the last 72 hours. Thyroid Function Tests No results for input(s): TSH, T4TOTAL, T3FREE, THYROIDAB in the last 72 hours.  Invalid input(s): FREET3  Other results:   Imaging    DG Chest Port 1 View  Result Date: 03/01/2021 CLINICAL DATA:  Shortness of breath for 4-5 days. EXAM: PORTABLE CHEST 1 VIEW COMPARISON:  Chest radiograph 01/11/2021 FINDINGS: Stable cardiomediastinal contours with enlarged heart size. Aortic arch calcification. Left chest pacer appears unchanged in position. Stable scarring in the left mid lung. No new focal opacity. No pneumothorax or significant pleural effusion. No acute finding in the visualized skeleton. IMPRESSION: No acute cardiopulmonary finding. Electronically Signed   By: Emmaline Kluver M.D.   On: 03/01/2021 21:52      Medications:     Scheduled Medications: . amiodarone  200 mg Oral Daily  . carvedilol  6.25 mg Oral BID WC  . clopidogrel  75 mg Oral Q breakfast  . ezetimibe  10 mg Oral Daily  . isosorbide mononitrate  30  mg Oral Daily  . sacubitril-valsartan  1 tablet Oral BID  . spironolactone  25 mg Oral Daily     Infusions:   PRN Medications:  acetaminophen, nitroGLYCERIN, ondansetron (ZOFRAN) IV   Assessment/Plan   1. VT w/ ICD Shock: Suspect scar-mediated in 12/21 with ICD discharge and syncope. She had VT with presyncope in 1/22. Now readmitted for ICD shocks x 4 for recurrent VT. K 3.4. Mg 2.2. QT/QTc 442/494 ms.? Ischemically driven. Has had recent nitrate responsive CP requiring up titration of antianginals. Hs trop 2,161>>3,070. ? Type I NSTEMI vs demand from VT/ ICD shocks.  - Plan definitive LHC - Continue mexiletine 150 mg tid - Continue amiodarone 200 mg daily  - Keep K > 4.0 and Mg > 2.0  - No driving x 6 months  - Will ask EP to see  2 CAD: MI in 2008 in [facility], cath at the time showed totally occluded diagonal, totally occluded LCX, and diffuse LAD disease.  No intervention. LHC was done in 12/21 due to VT, this showed chronically occluded D1 and 95% proximal LCx.  Initially, no PCI due to thrombocytopenia.  She eventually had PCI to proximal LCx in 1/22.  She has had frequent mild episodes of nitrate responsive chest pain since her PCI. Antianginal regimen recently adjusted in outpatient clinic. Imdur added.  - Now w/ VT treated w/ ICD shock x 4 - Hs Trop 2,161>>3,070. EKG no acute ST changes. - Plan LHC once INR < 1.8. Hold coumadin. Start IV heparin once INR < 2.0  - Continue Zetia, has not tolerated statins.  Good lipids in 12/21.   - Continue Plavix 75 mg daily x at least 6 months and ideally 1 year. - She is off ASA given use of warfarin and Plavix.  3. Atrial fibrillation: TEE-guided DCCV to NSR in 12/21.  She is in NSR. No breakthrough Afib on device interrogation.  - continue amiodarone  - If atrial fibrillation recurs, will need to consider Tikosyn versus ablation.  - Hold Coumadin for cath. IV heparin once INR <2.0  4. Carotid stenosis: Known RICA occlusion. Dopplers in 12/21  with <50% LICA.  - Repeat dopplers in 12/22 4. 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  - She is on chronic coumadin. INR 2.1. Hold coumadin for cath. IV heparin once INR <2.0 - Will need Lovenox bridge once invasive procedures completed  6. Chronic systolic CHF: Ischemic cardiomyopathy.  [employer] ICD.  Echo (8/21) with EF 15-20%, anteroseptal AK, RV normal, severe LAE, mild MR.  TEE (12/21) with EF 25-30%.  RHC in 12/21 with preserved cardiac  output and optimized filling pressures.  NYHA class II symptoms, not volume overloaded on exam.   - Continue Entresto 24-26 bid (BP too soft for titration)  - Continue Farxiga 10 mg daily.   - Continue spironolactone 25 mg daily.  - Continue Coreg 6.25 mg bid.  - Continue torsemide 80 mg daily - Continue digoxin 0.0625 daily. Dig level 0.7  - Continue Imdur 30 (mainly for antianginal benefit) 7. Smoking: Active smoker, wants to stop on her own.  8. PAD: She denies claudication.  - Needs to quit smoking.  9. 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  - Plt 124 on admit>>193 today  - She will see Dr. Truett Perna (hematology).  10. Mitral regurgitation: Moderate on 12/21 TEE, suspect functional.  11. Leukocytosis: WBC 11.5>>13.5. AF. No symptoms concerning for infection. ? Reactive. - follow CBC  12. Hypokalemia - K 3.4 on admit. Mg ok at 2.2  - Keep > 4.0 in setting of recent VT - Supp w/ KCl   Length of Stay: 0  Brittainy Simmons, PA-C  03/02/2021, 7:50 AM  Advanced Heart Failure Team Pager [phone] (M-F; 7a - 4p)  Please contact [facility] for night-coverage after hours (4p -7a ) and weekends on [email or web address]  Patient seen with PA, agree with the above note.   Patient with recurrent VT and ICD discharge, she did not pass out.  Earlier this week, she had had 2 episodes of early am chest pain relieved with NTG.  She had also been more short of breath with exertion this week.  HS-TnI elevated.  General:  NAD Neck: No JVD, no thyromegaly or thyroid nodule.  Lungs: Clear to auscultation bilaterally with normal respiratory effort. CV: Nondisplaced PMI.  Heart regular S1/S2, no S3/S4, no murmur.  No peripheral edema.   Abdomen: Soft, nontender, no hepatosplenomegaly, no distention.  Skin: Intact without lesions or rashes.  Neurologic: Alert and oriented x 3.  Psych: Normal affect. Extremities: No clubbing or cyanosis.  HEENT: Normal.   ?Scar-mediated VT versus ischemia.  Prior VT in 1/22.  Had PCI to LCx in 1/22. HS-TnI up to 3070, ?demand ischemia with VT and shocks x 4 versus true ACS.  No chest pain currently.  - Reload amiodarone 400 mg bid.  - Continue mexiletine.   - Will need RHC/LHC, plan for Monday as she is currently asymptomatic and INR is 2.9.   - Continue Plavix, hold warfarin and start heparin gtt when INR < 2.  - Off ASA given anticoagulation.  - EP evaluation after cath, ?VT ablation if no new coronary disease.   CHF appears well-compensated, not volume overloaded on exam.  Creatinine stable.  I will continue her home meds including torsemide 80 mg daily.   History of thrombocytopenia, plts currently normal but follow closely.    [name] 03/02/2021 1:15 PM

## 2021-03-02 NOTE — Progress Notes (Signed)
ANTICOAGULATION CONSULT NOTE - Initial Consult  Pharmacy Consult for Heparin Indication: persistent AFib, anticardiolipin syndrome  Allergies  Allergen Reactions  . Beef-Derived Products Anaphylaxis    From tick bite  . Metoclopramide Hcl Anaphylaxis    Non responsive  . Penicillins Anaphylaxis    Pt states she can take any cephalosporin (per pt. 01/01/19) Shock Has patient had a PCN reaction causing immediate rash, facial/tongue/throat swelling, SOB or lightheadedness with hypotension: Yes Has patient had a PCN reaction causing severe rash involving mucus membranes or skin necrosis: No Has patient had a PCN reaction that required hospitalization Yes Has patient had a PCN reaction occurring within the last 10 years: No If all of the above answers are "NO", then may proceed with Cephalosporin   . Pork-Derived Products Anaphylaxis    From tick bite. Has tolerated heparin/lovenox  . Metoprolol Other (See Comments)    Syncope   . Reglan [Metoclopramide]   . Statins Other (See Comments)    Myalgias    Patient Measurements: Height: 5\' 5"  (165.1 cm) Weight: 86.2 kg (190 lb) IBW/kg (Calculated) : 57   Vital Signs: Temp: 97.5 F (36.4 C) (03/04 0459) Temp Source: Oral (03/04 0459) BP: 95/66 (03/04 0459) Pulse Rate: 77 (03/04 0459)  Labs: Recent Labs    02/27/21 1605 03/01/21 2125 03/02/21 0444  HGB 14.6 14.9  --   HCT 44.6 46.1*  --   PLT 124* 193  --   LABPROT  --  22.9*  --   INR  --  2.1*  --   CREATININE 1.10* 1.22*  --   TROPONINIHS  --  2,161* 3,070*    Estimated Creatinine Clearance: 55.2 mL/min (A) (by C-G formula based on SCr of 1.22 mg/dL (H)).   Medical History: Past Medical History:  Diagnosis Date  . Aneurysm of internal carotid artery   . Anticardiolipin antibody positive    Chronic Coumadin  . Brain aneurysm    followed by Dr. 02-04-1976  . Chronic combined systolic (congestive) and diastolic (congestive) heart failure (HCC)   . Cocaine abuse in  remission (HCC)   . Coronary atherosclerosis of native coronary artery    Previous invasive cardiac testing was done at a facility in [place], [place].  Occluded diagonal, Diffuse LAD disease, Occluded Cx, and non obstructive RCA.   . Essential hypertension   . Headache   . History of pneumonia   . ICD (implantable cardioverter-defibrillator) in place   . Ischemic cardiomyopathy    LVEF 30-35%  . Mixed hyperlipidemia   . NSVT (nonsustained ventricular tachycardia) (HCC)   . Persistent atrial fibrillation (HCC)   . PVD (peripheral vascular disease) (HCC)   . Raynaud's phenomenon   . Statin intolerance   . Stroke [facility]) 2007   Total occlusion of the right internal carotid    Assessment: [name] with history of persistent Afib and anticardiolipin syndrome on warfarin PTA. Last dose taken 3/3 PM. INR 2.1 on admit. For [facility] to assess for NSTEMI. Pharmacy asked to start heparin when INR <2.   INR 2.7 this morning, up to 2.9 on repeat. After discussion with team, will stop warfarin and start heparin once INR <2 in anticipation for heart cath on 3/7.  Goal of Therapy:  Heparin level 0.3-0.7 units/ml when on heparin Monitor platelets by anticoagulation protocol: Yes   Plan:  Hold warfarin Plan to start IV heparin when INR <2 Daily INR, CBC, s/sx bleeding  5/7, PharmD, BCPS PGY2 Cardiology Pharmacy Resident Phone:  [phone] 03/02/2021  12:06 PM  Please check [email or web address] for unit-specific pharmacy phone numbers.

## 2021-03-02 NOTE — ED Provider Notes (Signed)
[facility] EMERGENCY DEPARTMENT Provider Note   CSN: [id number] Arrival date & time: 03/01/21  2046     History Chief Complaint  Patient presents with  . Shortness of Breath    Defibrillator fired    [name] [name] is a 60 y.o. female past medical history of CAD, ischemic cardiomyopathy, CHF, cocaine abuse in remission, CVA, persistent A. fib, ventricular tachycardia with [employer], anticardiolipin syndrome, type 2 diabetes. Patient is presenting to the ED from home after defib firing. She states she was feeling profoundly short of breath and then felt her defib fire four times. States she did not lose consciousness. States last time her defib fired was in January. Currently feel that she feels a little bit lightheaded and some soreness in her chest, though did not have any preceding chest pain prior to defib firing today (between 7:15 and 7:30 PM). She endorses worsening shortness of breath since Tuesday morning of this week. She states she took an additional 20 mg of her torsemide for 2 days, she saw cardiology on the first due to her shortness of breath. She has taken a total of 20 mg additional torsemide daily for the last 3 days without any provement in her shortness of breath. She is very short of breath on any exertion. Reports chronic orthopnea. She denies any lower extremity edema. Denies new cough or fevers. She is compliant on her Coumadin and Plavix, has not missed any doses. She is followed by Dr. Shirlee Latch and B[place] with cardiology.  The history is provided by the patient and medical records.   Per review of medical record, TEE in December 2021 reveals EF of 25 to 30%. Had cardiac catheterization as well which revealed multivessel stenosis and underwent PCI of the left circumflex in January 2022.    Past Medical History:  Diagnosis Date  . Aneurysm of internal carotid artery   . Anticardiolipin antibody positive    Chronic Coumadin   . Brain aneurysm    followed by Dr. Corliss Skains  . Chronic combined systolic (congestive) and diastolic (congestive) heart failure (HCC)   . Cocaine abuse in remission (HCC)   . Coronary atherosclerosis of native coronary artery    Previous invasive cardiac testing was done at a facility in [place], [place].  Occluded diagonal, Diffuse LAD disease, Occluded Cx, and non obstructive RCA.   . Essential hypertension   . Headache   . History of pneumonia   . ICD (implantable cardioverter-defibrillator) in place   . Ischemic cardiomyopathy    LVEF 30-35%  . Mixed hyperlipidemia   . NSVT (nonsustained ventricular tachycardia) (HCC)   . Persistent atrial fibrillation (HCC)   . PVD (peripheral vascular disease) (HCC)   . Raynaud's phenomenon   . Statin intolerance   . Stroke [facility]) 2007   Total occlusion of the right internal carotid    Patient Active Problem List   Diagnosis Date Noted  . Unstable angina (HCC) 01/11/2021  . Defibrillator discharge   . VT (ventricular tachycardia) (HCC) 01/04/2021  . CAD in native artery   . Persistent atrial fibrillation (HCC) 12/15/2020  . DM II (diabetes mellitus, type II), controlled (HCC) 12/15/2020  . Chronic systolic CHF (congestive heart failure) (HCC) 12/15/2020  . Ventricular tachycardia (HCC) 12/15/2020  . Right carotid artery occlusion   . Syncope 12/14/2020  . Acute respiratory failure with hypoxia (HCC) 08/11/2020  . Hypokalemia 08/11/2020  . GERD (gastroesophageal reflux disease) 08/11/2020  . Allergic rhinitis 08/11/2020  . SIRS (  systemic inflammatory response syndrome) (HCC) 01/06/2019  . HCAP (healthcare-associated pneumonia) 12/31/2018  . H. influenzae infection 12/24/2018  . Influenza A 12/24/2018  . Chest pain 06/18/2017  . Hyponatremia 06/18/2017  . Tobacco dependence 06/18/2017  . 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 Left-sided weakness   . Chronic  combined systolic (congestive) and diastolic (congestive) heart failure (HCC) 07/26/2015  . Encounter for therapeutic drug monitoring 02/02/2014  . Peripheral arterial disease (HCC) 11/09/2013  . Dual implantable cardioverter-defibrillator in situ 06/01/2012  . Long term current use of anticoagulant 03/22/2011  . Essential hypertension, benign 05/29/2010  . CHEST PAIN 05/29/2010  . Hyperlipidemia 09/26/2009  . Peripheral vascular disease (HCC) 06/23/2009  . Cardiomyopathy, ischemic 11/29/2008  . CEREBROVASCULAR DISEASE 11/29/2008  . Anticardiolipin syndrome (HCC) 11/29/2008    Past Surgical History:  Procedure Laterality Date  . ABDOMINAL AORTAGRAM N/A 05/25/2014   Procedure: ABDOMINAL Ronny Flurry;  Surgeon: Iran Ouch, MD;  Location: [facility] CATH LAB;  Service: Cardiovascular;  Laterality: N/A;  . APPENDECTOMY    . CARDIAC DEFIBRILLATOR PLACEMENT     [employer] ICD  . CARDIOVERSION N/A 12/01/2020   Procedure: CARDIOVERSION;  Surgeon: [name], MD;  Location: [facility] ENDOSCOPY;  Service: Cardiovascular;  Laterality: N/A;  . CORONARY STENT INTERVENTION N/A 01/08/2021   Procedure: CORONARY STENT INTERVENTION;  Surgeon: Swazil[place], Peter M, MD;  Location: [facility] INVASIVE CV LAB;  Service: Cardiovascular;  Laterality: N/A;  . EP IMPLANTABLE DEVICE N/A 03/01/2016   Procedure: ICD Generator Changeout;  Surgeon: [name], MD;  Location[facility] INVASIVE CV LAB;  Service: Cardiovascular;  Laterality: N/A;  . L1 corpectomy   12/10  . Laryngeal polyp excision    . RIGHT/LEFT HEART CATH AND CORONARY ANGIOGRAPHY N/A 12/18/2020   Procedure: RIGHT/LEFT HEART CATH AND CORONARY ANGIOGRAPHY;  Surgeon: Dolores Patty, MD;  Location: [facility] INVASIVE CV LAB;  Service: Cardiovascular;  Laterality: N/A;  . TEE WITHOUT CARDIOVERSION N/A 12/01/2020   Procedure: TRANSESOPHAGEAL ECHOCARDIOGRAM (TEE);  Surgeon: [name], MD;  Location: [facility] ENDOSCOPY;  Service: Cardiovascular;  Laterality: N/A;     OB History   No  obstetric history on file.     Family History  Problem Relation Age of Onset  . Heart disease Father 67  . Ankylosing spondylitis Sister   . Stomach cancer Brother 27  . Heart attack Brother     Social History   Tobacco Use  . Smoking status: Current Every Day Smoker    Packs/day: 1.00    Years: 43.00    Pack years: 43.00    Types: Cigarettes    Start date: 03/03/1978  . Smokeless tobacco: Never Used  . Tobacco comment: 4 ciggs per day  Vaping Use  . Vaping Use: Every day  Substance Use Topics  . Alcohol use: No    Alcohol/week: 0.0 standard drinks  . Drug use: No    Comment: Prior history of cocaine    Home Medications Prior to Admission medications   Medication Sig Start Date End Date Taking? Authorizing Provider  amiodarone (PACERONE) 200 MG tablet Take 1 tablet (200 mg total) by mouth daily. 01/30/21  Yes [name], MD  clopidogrel (PLAVIX) 75 MG tablet Take 1 tablet (75 mg total) by mouth daily with breakfast. 01/10/21  Yes Robbie Lis M, PA-C  digoxin (LANOXIN) 0.125 MG tablet Take by mouth. 02/26/21  Yes [provider]  famotidine (PEPCID) 20 MG tablet Take 1 tablet (20  mg total) by mouth 2 (two) times daily. Patient taking differently: Take 20 mg by mouth daily. 01/06/19 08/25/21 Yes Kari Baars, MD  fluticasone [facility]) 50 MCG/ACT nasal spray Place 1 spray into both nostrils daily as needed for allergies.  09/24/19  Yes [provider]  isosorbide mononitrate (IMDUR) 30 MG 24 hr tablet Take 1 tablet (30 mg total) by mouth daily. 02/27/21  Yes Robbie Lis M, PA-C  Multiple Vitamins-Minerals (MULTIVITAMINS THER. W/MINERALS) TABS Take 1 tablet by mouth daily.     Yes [provider]  nitroGLYCERIN (NITROSTAT) 0.4 MG SL tablet PLACE 1 TABLET UNDER THE TONGUE EVERY 5 MINUTES FOR 3 DOSES AS NEEDED CHEST PAIN Patient taking differently: Place 0.4 mg under the tongue every 5 (five) minutes as needed for chest pain. 01/25/21  Yes  [name], MD  oxyCODONE-acetaminophen (PERCOCET) 10-325 MG tablet Take 1 tablet by mouth every 4 (four) hours as needed for pain.   Yes [provider]  potassium chloride SA (KLOR-CON) 20 MEQ tablet Take 2 tablets (40 mEq total) by mouth daily. 02/12/21  Yes [name], MD  sacubitril-valsartan (ENTRESTO) 24-26 MG Take 1 tablet by mouth 2 (two) times daily.   Yes [provider]  spironolactone (ALDACTONE) 25 MG tablet Take 1 tablet (25 mg total) by mouth daily. 11/28/20  Yes [name], MD  torsemide (DEMADEX) 20 MG tablet TAKE 4 TABLETS BY MOUTH EVERY DAY. MAY TAKE EXTRA TABLET AS NEEDED Patient taking differently: Take 80 mg by mouth daily. 01/23/21  Yes [name], MD  warfarin (COUMADIN) 10 MG tablet TAKE 1/2 TABLET BY MOUTH EVERY DAY EXCEPT 1 TABLET ON SUNDAYS AND THURSDAYS 02/12/21  Yes [name], MD  carvedilol (COREG) 12.5 MG tablet Take 12.5 mg by mouth 2 (two) times daily. 01/23/21   [provider]  carvedilol (COREG) 6.25 MG tablet Take 1 tablet (6.25 mg total) by mouth 2 (two) times daily with a meal. Patient not taking: Reported on 03/02/2021 12/19/20   Robbie Lis M, PA-C  DULoxetine (CYMBALTA) 30 MG capsule Take 30 mg by mouth daily.    [provider]  DULoxetine (CYMBALTA) 60 MG capsule Take 60 mg by mouth every evening.    [provider]  empagliflozin (JARDIANCE) 10 MG TABS tablet Take 1 tablet (10 mg total) by mouth daily before breakfast. 01/03/21   [name], MD  ezetimibe (ZETIA) 10 MG tablet Take 1 tablet (10 mg total) by mouth daily. 08/16/20 11/17/21  Sherryll Burger, Pratik D, DO  mexiletine (MEXITIL) 150 MG capsule Take 1 capsule (150 mg total) by mouth every 8 (eight) hours. 01/09/21   [name], MD  promethazine (PHENERGAN) 25 MG tablet Take 25 mg by mouth every 6 (six) hours as needed for nausea. With pain medication    [provider]  zolpidem (AMBIEN) 10 MG tablet Take  10 mg by mouth at bedtime as needed for sleep. 02/09/21   [provider]    Allergies    Beef-derived products, Metoclopramide hcl, Penicillins, Pork-derived products, Metoprolol, Reglan [metoclopramide], and Statins  Review of Systems   Review of Systems  Constitutional: Positive for fatigue.  Respiratory: Positive for shortness of breath.   Gastrointestinal: Positive for nausea.  Neurological: Positive for light-headedness.  All other systems reviewed and are negative.   Physical Exam Updated Vital Signs BP 103/74   Pulse 80   Temp 98.2 F (36.8 C) (Oral)   Resp 16   Ht 5'  5" (1.651 m)   Wt 86.2 kg   SpO2 96%   BMI 31.62 kg/m   Physical Exam Vitals and nursing note reviewed.  Constitutional:      General: She is not in acute distress.    Appearance: She is well-developed and well-nourished.  HENT:     Head: Normocephalic and atraumatic.  Eyes:     Conjunctiva/sclera: Conjunctivae normal.  Cardiovascular:     Rate and Rhythm: Normal rate and regular rhythm.     Pulses: Normal pulses.  Pulmonary:     Effort: Pulmonary effort is normal.     Breath sounds: Normal breath sounds.  Abdominal:     General: Bowel sounds are normal.     Palpations: Abdomen is soft.     Tenderness: There is no abdominal tenderness.  Musculoskeletal:     Right lower leg: No edema.     Left lower leg: No edema.  Skin:    General: Skin is warm.  Neurological:     Mental Status: She is alert.  Psychiatric:        Mood and Affect: Mood and affect normal.        Behavior: Behavior normal.     ED Results / Procedures / Treatments   Labs (all labs ordered are listed, but only abnormal results are displayed) Labs Reviewed  BRAIN NATRIURETIC PEPTIDE - Abnormal; Notable for the following components:      Result Value   B Natriuretic Peptide 194.0 (*)    All other components within normal limits  BASIC METABOLIC PANEL - Abnormal; Notable for the following components:   Sodium  133 (*)    Potassium 3.4 (*)    Chloride 96 (*)    Glucose, Bld 102 (*)    BUN 22 (*)    Creatinine, Ser 1.22 (*)    GFR, Estimated 52 (*)    All other components within normal limits  CBC WITH DIFFERENTIAL/PLATELET - Abnormal; Notable for the following components:   WBC 13.5 (*)    RBC 5.14 (*)    HCT 46.1 (*)    Neutro Abs 10.3 (*)    Monocytes Absolute 1.1 (*)    All other components within normal limits  PROTIME-INR - Abnormal; Notable for the following components:   Prothrombin Time 22.9 (*)    INR 2.1 (*)    All other components within normal limits  TROPONIN I (HIGH SENSITIVITY) - Abnormal; Notable for the following components:   Troponin I (High Sensitivity) 2,161 (*)    All other components within normal limits  RESP PANEL BY RT-PCR (FLU A&B, COVID) ARPGX2  MAGNESIUM  MAGNESIUM  TROPONIN I (HIGH SENSITIVITY)    EKG None  Radiology DG Chest Port 1 View  Result Date: 03/01/2021 CLINICAL DATA:  Shortness of breath for 4-5 days. EXAM: PORTABLE CHEST 1 VIEW COMPARISON:  Chest radiograph 01/11/2021 FINDINGS: Stable cardiomediastinal contours with enlarged heart size. Aortic arch calcification. Left chest pacer appears unchanged in position. Stable scarring in the left mid lung. No new focal opacity. No pneumothorax or significant pleural effusion. No acute finding in the visualized skeleton. IMPRESSION: No acute cardiopulmonary finding. Electronically Signed   By: Emmaline KluverNancy  Ballantyne M.D.   On: 03/01/2021 21:52    Procedures Procedures   Medications Ordered in ED Medications  LORazepam (ATIVAN) injection 0.5 mg (0.5 mg Intravenous Given 03/01/21 2230)    ED Course  I have reviewed the triage vital signs and the nursing notes.  Pertinent labs & imaging results  that were available during my care of the patient were reviewed by me and considered in my medical decision making (see chart for details).    MDM Rules/Calculators/A&P                          Patient is a  60 year old female with significant cardiac history including multivessel CAD, heart failure with EF of 25 to 30%, A. fib, V. tach with pacemaker and defibrillator, as well as anticardiolipin syndrome on Coumadin and Plavix. She presents today after her defib fired four times after feeling profound shortness of breath immediately prior to defib firing. She states however throughout the week she has been feeling more short of breath than usual and thought she was having heart failure exacerbation. She treated with additional 20 mg of torsemide daily, totaling 100 mg daily for last 3 days, however without significant relief. Signs of fluid overload on exam.  [employer] pacer/defib interrogated is reported to have fired four times for V. tach at 39 J, per [employer]. Pending faxed report.   Metabolic panel reveals potassium of 3.4, creatinine 1.2. BNP is 194, INR is therapeutic at 2.1. Troponin is significantly elevated at 21,000. Chest x-ray is clear. Mag level pending.  Regarding patient's shortness of breath, low suspicion for PE as she is compliant on Coumadin and Plavix, and is therapeutic with INR 2.1. Will send Covid swab.   Consult placed to cardiology, discussed with Ak[facility]ter. Cardiology to admit for further management.   Final Clinical Impression(s) / ED Diagnoses Final diagnoses:  Ventricular tachycardia [facility])  Defibrillator discharge  Shortness of breath    Rx / DC Orders ED Discharge Orders    None       Apollonia Amini, [place] N, PA-C 03/02/21 0045    Arby Barrette, MD 03/06/21 1210

## 2021-03-03 DIAGNOSIS — I25110 Atherosclerotic heart disease of native coronary artery with unstable angina pectoris: Secondary | ICD-10-CM | POA: Diagnosis not present

## 2021-03-03 DIAGNOSIS — I214 Non-ST elevation (NSTEMI) myocardial infarction: Secondary | ICD-10-CM

## 2021-03-03 DIAGNOSIS — I5042 Chronic combined systolic (congestive) and diastolic (congestive) heart failure: Secondary | ICD-10-CM | POA: Diagnosis not present

## 2021-03-03 DIAGNOSIS — I472 Ventricular tachycardia: Secondary | ICD-10-CM | POA: Diagnosis not present

## 2021-03-03 DIAGNOSIS — I739 Peripheral vascular disease, unspecified: Secondary | ICD-10-CM

## 2021-03-03 DIAGNOSIS — D6861 Antiphospholipid syndrome: Secondary | ICD-10-CM

## 2021-03-03 LAB — BASIC METABOLIC PANEL
Anion gap: 9 (ref 5–15)
BUN: 17 mg/dL (ref 6–20)
CO2: 24 mmol/L (ref 22–32)
Calcium: 8.6 mg/dL — ABNORMAL LOW (ref 8.9–10.3)
Chloride: 101 mmol/L (ref 98–111)
Creatinine, Ser: 0.81 mg/dL (ref 0.44–1.00)
GFR, Estimated: >60 mL/min (ref >60)
Glucose, Bld: 86 mg/dL (ref 70–99)
Potassium: 3.8 mmol/L (ref 3.5–5.1)
Sodium: 134 mmol/L — ABNORMAL LOW (ref 135–145)

## 2021-03-03 LAB — PROTIME-INR
INR: 3.5 — ABNORMAL HIGH (ref 0.8–1.2)
Prothrombin Time: 33.8 seconds — ABNORMAL HIGH (ref 11.4–15.2)

## 2021-03-03 LAB — CBC
HCT: 38.8 % (ref 36.0–46.0)
Hemoglobin: 13 g/dL (ref 12.0–15.0)
MCH: 30.1 pg (ref 26.0–34.0)
MCHC: 33.5 g/dL (ref 30.0–36.0)
MCV: 89.8 fL (ref 80.0–100.0)
Platelets: 206 10*3/uL (ref 150–400)
RBC: 4.32 MIL/uL (ref 3.87–5.11)
RDW: 12.9 % (ref 11.5–15.5)
WBC: 6.8 10*3/uL (ref 4.0–10.5)
nRBC: 0 % (ref 0.0–0.2)

## 2021-03-03 MED ORDER — DIGOXIN 125 MCG PO TABS
0.1250 mg | ORAL_TABLET | Freq: Every day | ORAL | Status: DC
  Administered 2021-03-03 – 2021-03-15 (×11): 0.125 mg via ORAL
  Filled 2021-03-03 (×11): qty 1

## 2021-03-03 MED ORDER — TRAZODONE HCL 50 MG PO TABS
50.0000 mg | ORAL_TABLET | Freq: Every evening | ORAL | Status: DC | PRN
  Administered 2021-03-03 – 2021-03-08 (×6): 50 mg via ORAL
  Filled 2021-03-03 (×7): qty 1

## 2021-03-03 MED ORDER — POTASSIUM CHLORIDE CRYS ER 20 MEQ PO TBCR
40.0000 meq | EXTENDED_RELEASE_TABLET | Freq: Once | ORAL | Status: AC
  Administered 2021-03-03: 40 meq via ORAL
  Filled 2021-03-03: qty 2

## 2021-03-03 MED ORDER — DAPAGLIFLOZIN PROPANEDIOL 10 MG PO TABS
10.0000 mg | ORAL_TABLET | Freq: Every day | ORAL | Status: DC
  Administered 2021-03-03 – 2021-03-15 (×11): 10 mg via ORAL
  Filled 2021-03-03 (×16): qty 1

## 2021-03-03 MED ORDER — TORSEMIDE 20 MG PO TABS
80.0000 mg | ORAL_TABLET | Freq: Every day | ORAL | Status: DC
  Administered 2021-03-03 – 2021-03-04 (×2): 80 mg via ORAL
  Filled 2021-03-03 (×3): qty 4

## 2021-03-03 NOTE — Progress Notes (Signed)
Progress Note  Patient Name: [name] [name] Date of Encounter: 03/03/2021  [facility] Cardiologist: Nona Dell, MD   Subjective   Denies angina. No ICD shocks, but she is anxious they may happen again and has some "phantom twinges". Denies dyspnea. Overnight, No VT. INR 3.5  Inpatient Medications    Scheduled Meds: . amiodarone  400 mg Oral BID  . carvedilol  6.25 mg Oral BID WC  . clopidogrel  75 mg Oral Q breakfast  . ezetimibe  10 mg Oral Daily  . isosorbide mononitrate  30 mg Oral Daily  . melatonin  3 mg Oral QHS  . mexiletine  150 mg Oral Q8H  . sacubitril-valsartan  1 tablet Oral BID  . [START ON 03/05/2021] sodium chloride flush  3 mL Intravenous Q12H  . spironolactone  25 mg Oral Daily   Continuous Infusions:  PRN Meds: acetaminophen, nitroGLYCERIN, oxyCODONE-acetaminophen **AND** oxyCODONE, prochlorperazine   Vital Signs    Vitals:   03/02/21 2308 03/03/21 0300 03/03/21 0800 03/03/21 1106  BP: (!) 104/59 (!) 121/53 (!) 128/51 110/62  Pulse: 66 62 64 (!) 57  Resp: 16 15 13 12   Temp: 98 F (36.7 C) 98.1 F (36.7 C) 97.7 F (36.5 C) 97.7 F (36.5 C)  TempSrc: Oral Oral Oral Oral  SpO2: 95% 95% 95% 95%  Weight:      Height:        Intake/Output Summary (Last 24 hours) at 03/03/2021 1155 Last data filed at 03/03/2021 0801 Gross per 24 hour  Intake 540 ml  Output --  Net 540 ml   Last 3 Weights 03/02/2021 03/01/2021 02/27/2021  Weight (lbs) 192 lb 3.9 oz 190 lb 201 lb 6.4 oz  Weight (kg) 87.2 kg 86.183 kg 91.354 kg      Telemetry    Mostly NSR. Occ PVCs. Occ a-paced,V sensed rhythm. - Personally Reviewed  ECG    NSR, minor IVCD QRS 110 md, QTc 494 ms - Personally Reviewed  Physical Exam  Appears well GEN: No acute distress.   Neck: No JVD. R carotid bruit Cardiac: RRR, no murmurs, rubs, or gallops.  Respiratory: Clear to auscultation bilaterally. GI: Soft, nontender, non-distended  MS: No edema; No deformity. Neuro:  Nonfocal  Psych:  Normal affect   Labs    High Sensitivity Troponin:   Recent Labs  Lab 03/01/21 2125 03/02/21 0444  TROPONINIHS 2,161* 3,070*      Chemistry Recent Labs  Lab 02/27/21 1605 03/01/21 2125 03/03/21 0050  NA 136 133* 134*  K 3.8 3.4* 3.8  CL 98 96* 101  CO2 25 25 24   GLUCOSE 152* 102* 86  BUN 15 22* 17  CREATININE 1.10* 1.22* 0.81  CALCIUM 9.1 8.9 8.6*  GFRNONAA 59* 52* >60  ANIONGAP 13 12 9      Hematology Recent Labs  Lab 02/27/21 1605 03/01/21 2125 03/03/21 0050  WBC 11.5* 13.5* 6.8  RBC 4.94 5.14* 4.32  HGB 14.6 14.9 13.0  HCT 44.6 46.1* 38.8  MCV 90.3 89.7 89.8  MCH 29.6 29.0 30.1  MCHC 32.7 32.3 33.5  RDW 12.9 12.9 12.9  PLT 124* 193 206    BNP Recent Labs  Lab 02/27/21 1605 03/01/21 2125  BNP 320.5* 194.0*     DDimer No results for input(s): DDIMER in the last 168 hours.   Radiology    DG Chest Port 1 View  Result Date: 03/01/2021 CLINICAL DATA:  Shortness of breath for 4-5 days. EXAM: PORTABLE CHEST 1 VIEW COMPARISON:  Chest  radiograph 01/11/2021 FINDINGS: Stable cardiomediastinal contours with enlarged heart size. Aortic arch calcification. Left chest pacer appears unchanged in position. Stable scarring in the left mid lung. No new focal opacity. No pneumothorax or significant pleural effusion. No acute finding in the visualized skeleton. IMPRESSION: No acute cardiopulmonary finding. Electronically Signed   By: Emmaline Kluver M.D.   On: 03/01/2021 21:52    Cardiac Studies   TEE 12/01/2020 1. Left ventricular ejection fraction, by estimation, is 25 to 30%. The  left ventricle has severely decreased function. The left ventricle  demonstrates regional wall motion abnormalities with severe hypokinesis to  akinesis of the anterior and septal  walls. The left ventricular internal cavity size was mildly dilated.  2. Right ventricular systolic function is mildly reduced. The right  ventricular size is normal.  3. Left atrial size was  moderately dilated. No left atrial/left atrial  appendage thrombus was detected.  4. Right atrial size was mildly dilated.  5. There was moderate mitral regurgitation with ERO 0.26 cm^2 by PISA  (suspect functional MR). No evidence of mitral stenosis.  6. No PFO/ASD by color doppler.  7. The aortic valve is tricuspid. Aortic valve regurgitation is not  visualized. No aortic stenosis is present.  8. Peak RV-RA gradient 18 mmHg.  9. Normal caliber thoracic aorta with no significant plaque.   Patient Profile     60 y.o. 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  Assessment & Plan    1. VT: no recurrence overnight. On carvedilol, amio and mexiletine. Unclear if scar VT or ischemia mediated. Note plan for EP consultation. Keep K>4. 2. CAD: for cath once INR<1.8. Scheduled for Monday w Dr. Shirlee Latch. Currently angina free, but had frequent nitrate-responsive angina over last 2 months. Note moderate elevation in hsTrop (due to shocks or vice-versa?). On clopidogrel. 3. CHF: clinically euvolemic, on GDMT. 4. AFib: no recurrence since DCCV  In December. On warfarin. Start IV heparin when INR<2.0. 5. PAD/R ICA stenosis: currently asymptomatic. 6. AntiPL sd/warfarin: INR 3.5 today. Cath when INR<1.8. Not sure if tbpenia is related to this or separate ITP syndrome. Current plt count is normal.  For questions or updates, please contact [facility] Please consult [email or web address] for contact info under        Signed, Thurmon Fair, MD  03/03/2021, 11:55 AM

## 2021-03-03 NOTE — Progress Notes (Signed)
Called to room by patient stating she thinks her ICD may have fired.  She was lying in bed alert and oriented x4 and vitals are stable.  Upon looking at telemetry reading around 0110 patient had what appears to be pacer spike initated for 3 beats.  Dr. Glenford Bayley notified at the above.  He states patient's ICD will be interrogated in am when seen by attending.  Patient informed to call for further needs.  Nursing staff to continue to monitor.

## 2021-03-03 NOTE — Progress Notes (Signed)
ANTICOAGULATION CONSULT NOTE - Initial Consult  Pharmacy Consult for Heparin Indication: persistent AFib, anticardiolipin syndrome  Allergies  Allergen Reactions  . Beef-Derived Products Anaphylaxis    From tick bite  . Metoclopramide Hcl Anaphylaxis    Non responsive  . Penicillins Anaphylaxis    Pt states she can take any cephalosporin (per pt. 01/01/19) Shock Has patient had a PCN reaction causing immediate rash, facial/tongue/throat swelling, SOB or lightheadedness with hypotension: Yes Has patient had a PCN reaction causing severe rash involving mucus membranes or skin necrosis: No Has patient had a PCN reaction that required hospitalization Yes Has patient had a PCN reaction occurring within the last 10 years: No If all of the above answers are "NO", then may proceed with Cephalosporin   . Pork-Derived Products Anaphylaxis    From tick bite. Has tolerated heparin/lovenox  . Metoprolol Other (See Comments)    Syncope   . Reglan [Metoclopramide]   . Statins Other (See Comments)    Myalgias    Patient Measurements: Height: 5\' 5"  (165.1 cm) Weight: 87.2 kg (192 lb 3.9 oz) IBW/kg (Calculated) : 57   Vital Signs: Temp: 97.7 F (36.5 C) (03/05 0800) Temp Source: Oral (03/05 0800) BP: 128/51 (03/05 0800) Pulse Rate: 64 (03/05 0800)  Labs: Recent Labs    03/01/21 2125 03/02/21 0444 03/02/21 0803 03/02/21 1115 03/03/21 0050  HGB 14.9  --   --   --  13.0  HCT 46.1*  --   --   --  38.8  PLT 193  --   --   --  206  LABPROT 22.9*  --  27.7* 29.3* 33.8*  INR 2.1*  --  2.7* 2.9* 3.5*  CREATININE 1.22*  --   --   --  0.81  TROPONINIHS 2,161* 3,070*  --   --   --     Estimated Creatinine Clearance: 83.6 mL/min (by C-G formula based on SCr of 0.81 mg/dL).   Medical History: Past Medical History:  Diagnosis Date  . Aneurysm of internal carotid artery   . Anticardiolipin antibody positive    Chronic Coumadin  . Brain aneurysm    followed by Dr. 02-04-1976  .  Chronic combined systolic (congestive) and diastolic (congestive) heart failure (HCC)   . Cocaine abuse in remission (HCC)   . Coronary atherosclerosis of native coronary artery    Previous invasive cardiac testing was done at a facility in [place], [place].  Occluded diagonal, Diffuse LAD disease, Occluded Cx, and non obstructive RCA.   . Essential hypertension   . Headache   . History of pneumonia   . ICD (implantable cardioverter-defibrillator) in place   . Ischemic cardiomyopathy    LVEF 30-35%  . Mixed hyperlipidemia   . NSVT (nonsustained ventricular tachycardia) (HCC)   . Persistent atrial fibrillation (HCC)   . PVD (peripheral vascular disease) (HCC)   . Raynaud's phenomenon   . Statin intolerance   . Stroke [facility]) 2007   Total occlusion of the right internal carotid    Assessment: [name] with history of persistent Afib and anticardiolipin syndrome on warfarin PTA. Last dose taken 3/3 PM. INR 2.1 on admit. For [facility] to assess for NSTEMI. Pharmacy asked to start heparin when INR <2.   INR trended up to 3.5 this morning. After discussion with team, warfarin stopped and will plan on starting heparin once INR <2 in anticipation for heart cath on 3/7. Hgb down from 14.9>13 this morning, will follow closely. May need a small  dose of vitamin k if not trending down in am.   Goal of Therapy:  Heparin level 0.3-0.7 units/ml when on heparin Monitor platelets by anticoagulation protocol: Yes   Plan:  Hold warfarin for now Plan to start IV heparin when INR <2 Daily INR, CBC, s/sx bleeding  Sheppard Coil PharmD., BCPS Clinical Pharmacist 03/03/2021 8:10 AM

## 2021-03-04 DIAGNOSIS — I5042 Chronic combined systolic (congestive) and diastolic (congestive) heart failure: Secondary | ICD-10-CM | POA: Diagnosis not present

## 2021-03-04 LAB — PROTIME-INR
INR: 2.3 — ABNORMAL HIGH (ref 0.8–1.2)
Prothrombin Time: 24.1 seconds — ABNORMAL HIGH (ref 11.4–15.2)

## 2021-03-04 LAB — CBC
HCT: 43.8 % (ref 36.0–46.0)
Hemoglobin: 14.2 g/dL (ref 12.0–15.0)
MCH: 29.7 pg (ref 26.0–34.0)
MCHC: 32.4 g/dL (ref 30.0–36.0)
MCV: 91.6 fL (ref 80.0–100.0)
Platelets: 223 10*3/uL (ref 150–400)
RBC: 4.78 MIL/uL (ref 3.87–5.11)
RDW: 12.9 % (ref 11.5–15.5)
WBC: 9 10*3/uL (ref 4.0–10.5)
nRBC: 0 % (ref 0.0–0.2)

## 2021-03-04 LAB — BASIC METABOLIC PANEL
Anion gap: 13 (ref 5–15)
BUN: 13 mg/dL (ref 6–20)
CO2: 27 mmol/L (ref 22–32)
Calcium: 9.2 mg/dL (ref 8.9–10.3)
Chloride: 97 mmol/L — ABNORMAL LOW (ref 98–111)
Creatinine, Ser: 0.85 mg/dL (ref 0.44–1.00)
GFR, Estimated: >60 mL/min (ref >60)
Glucose, Bld: 91 mg/dL (ref 70–99)
Potassium: 3.5 mmol/L (ref 3.5–5.1)
Sodium: 137 mmol/L (ref 135–145)

## 2021-03-04 LAB — MAGNESIUM: Magnesium: 2.3 mg/dL (ref 1.7–2.4)

## 2021-03-04 MED ORDER — SODIUM CHLORIDE 0.9 % IV SOLN: INTRAVENOUS | Status: DC

## 2021-03-04 MED ORDER — ASPIRIN 81 MG PO CHEW
81.0000 mg | CHEWABLE_TABLET | ORAL | Status: AC
  Administered 2021-03-05: 81 mg via ORAL
  Filled 2021-03-04: qty 1

## 2021-03-04 MED ORDER — SODIUM CHLORIDE 0.9% FLUSH: 3.0000 mL | INTRAVENOUS | Status: DC | PRN

## 2021-03-04 MED ORDER — POTASSIUM CHLORIDE CRYS ER 20 MEQ PO TBCR
40.0000 meq | EXTENDED_RELEASE_TABLET | Freq: Four times a day (QID) | ORAL | Status: AC
  Administered 2021-03-04 (×2): 40 meq via ORAL
  Filled 2021-03-04 (×2): qty 2

## 2021-03-04 MED ORDER — SODIUM CHLORIDE 0.9 % IV SOLN: 250.0000 mL | INTRAVENOUS | Status: DC | PRN

## 2021-03-04 NOTE — Progress Notes (Signed)
ANTICOAGULATION CONSULT NOTE   Pharmacy Consult for Heparin Indication: persistent AFib, anticardiolipin syndrome  Allergies  Allergen Reactions  . Beef-Derived Products Anaphylaxis    From tick bite  . Metoclopramide Hcl Anaphylaxis    Non responsive  . Penicillins Anaphylaxis    Pt states she can take any cephalosporin (per pt. 01/01/19) Shock Has patient had a PCN reaction causing immediate rash, facial/tongue/throat swelling, SOB or lightheadedness with hypotension: Yes Has patient had a PCN reaction causing severe rash involving mucus membranes or skin necrosis: No Has patient had a PCN reaction that required hospitalization Yes Has patient had a PCN reaction occurring within the last 10 years: No If all of the above answers are "NO", then may proceed with Cephalosporin   . Pork-Derived Products Anaphylaxis    From tick bite. Has tolerated heparin/lovenox  . Metoprolol Other (See Comments)    Syncope   . Reglan [Metoclopramide]   . Statins Other (See Comments)    Myalgias    Patient Measurements: Height: 5\' 5"  (165.1 cm) Weight: 87.2 kg (192 lb 3.9 oz) IBW/kg (Calculated) : 57   Vital Signs: Temp: 97.5 F (36.4 C) (03/06 1053) Temp Source: Oral (03/06 1053) BP: 109/59 (03/06 1053) Pulse Rate: 77 (03/06 1053)  Labs: Recent Labs    03/01/21 2125 03/02/21 0444 03/02/21 0803 03/02/21 1115 03/03/21 0050 03/04/21 0206  HGB 14.9  --   --   --  13.0 14.2  HCT 46.1*  --   --   --  38.8 43.8  PLT 193  --   --   --  206 223  LABPROT 22.9*  --    < > 29.3* 33.8* 24.1*  INR 2.1*  --    < > 2.9* 3.5* 2.3*  CREATININE 1.22*  --   --   --  0.81 0.85  TROPONINIHS 2,161* 3,070*  --   --   --   --    < > = values in this interval not displayed.    Estimated Creatinine Clearance: 79.7 mL/min (by C-G formula based on SCr of 0.85 mg/dL).   Medical History: Past Medical History:  Diagnosis Date  . Aneurysm of internal carotid artery   . Anticardiolipin antibody  positive    Chronic Coumadin  . Brain aneurysm    followed by Dr. 02-04-1976  . Chronic combined systolic (congestive) and diastolic (congestive) heart failure (HCC)   . Cocaine abuse in remission (HCC)   . Coronary atherosclerosis of native coronary artery    Previous invasive cardiac testing was done at a facility in [place], [place].  Occluded diagonal, Diffuse LAD disease, Occluded Cx, and non obstructive RCA.   . Essential hypertension   . Headache   . History of pneumonia   . ICD (implantable cardioverter-defibrillator) in place   . Ischemic cardiomyopathy    LVEF 30-35%  . Mixed hyperlipidemia   . NSVT (nonsustained ventricular tachycardia) (HCC)   . Persistent atrial fibrillation (HCC)   . PVD (peripheral vascular disease) (HCC)   . Raynaud's phenomenon   . Statin intolerance   . Stroke [facility]) 2007   Total occlusion of the right internal carotid    Assessment: [name] with history of persistent Afib and anticardiolipin syndrome on warfarin PTA. Last dose taken 3/3 PM. INR 2.1 on admit. For [facility] to assess for NSTEMI. Pharmacy asked to start heparin when INR <2.   INR now trending down to 2.3 this morning. After discussion with team last week, warfarin stopped  and will plan on starting heparin once INR <2 in anticipation for heart cath on 3/7. CBC stable overnight.   Goal of Therapy:  Heparin level 0.3-0.7 units/ml when on heparin Monitor platelets by anticoagulation protocol: Yes   Plan:  Hold warfarin for now Plan to start IV heparin when INR <2 Daily INR, CBC, s/sx bleeding  Sheppard Coil PharmD., BCPS Clinical Pharmacist 58/05/2021 11:33 AM

## 2021-03-04 NOTE — Progress Notes (Signed)
Patient ID: Fabiha H Stephen, female   DOB: 04/12/1961, 60 y.o.   MRN: [id number]     Advanced Heart Failure Rounding Note  PCP-Cardiologist: Samuel McDowell, MD   Subjective:    Admitted  for ICD shocks x 4 for VT. Has had recent nitrate responsive CP. Has ruled in for NSTEMI.   Hs Trop 2,161>>3,070. EKG no acute ST changes.   On Coumadin for anti-phospholipid antibody syndrome, now held.  INR 2.3 today (trending down).   No further VT on telemetry.  No complaints, walked in room yesterday.   Objective:   Weight Range: 87.2 kg Body mass index is 31.99 kg/m.   Vital Signs:   Temp:  [97.6 F (36.4 C)-98.2 F (36.8 C)] 98.1 F (36.7 C) (03/06 0748) Pulse Rate:  [57-76] 74 (03/06 0748) Resp:  [11-18] 18 (03/06 0748) BP: (98-124)/(48-76) 101/76 (03/06 0748) SpO2:  [93 %-96 %] 95 % (03/06 0748) Last BM Date: 03/02/21  Weight change: Filed Weights   03/01/21 2051 03/02/21 1241  Weight: 86.2 kg 87.2 kg    Intake/Output:   Intake/Output Summary (Last 24 hours) at 03/04/2021 0856 Last data filed at 03/04/2021 0749 Gross per 24 hour  Intake 360 ml  Output 3250 ml  Net -2890 ml      Physical Exam    General: NAD Neck: No JVD, no thyromegaly or thyroid nodule.  Lungs: Clear to auscultation bilaterally with normal respiratory effort. CV: Nondisplaced PMI.  Heart regular S1/S2, no S3/S4, no murmur.  No peripheral edema.   Abdomen: Soft, nontender, no hepatosplenomegaly, no distention.  Skin: Intact without lesions or rashes.  Neurologic: Alert and oriented x 3.  Psych: Normal affect. Extremities: No clubbing or cyanosis.  HEENT: Normal.    Telemetry   NSR, no VT.  Personally reviewed.    Labs    CBC Recent Labs    03/01/21 2125 03/03/21 0050 03/04/21 0206  WBC 13.5* 6.8 9.0  NEUTROABS 10.3*  --   --   HGB 14.9 13.0 14.2  HCT 46.1* 38.8 43.8  MCV 89.7 89.8 91.6  PLT 193 206 223   Basic Metabolic Panel Recent Labs    03/02/21 0444 03/03/21 0050  03/04/21 0206  NA  --  134* 137  K  --  3.8 3.5  CL  --  101 97*  CO2  --  24 27  GLUCOSE  --  86 91  BUN  --  17 13  CREATININE  --  0.81 0.85  CALCIUM  --  8.6* 9.2  MG 2.2  --  2.3   Liver Function Tests No results for input(s): AST, ALT, ALKPHOS, BILITOT, PROT, ALBUMIN in the last 72 hours. No results for input(s): LIPASE, AMYLASE in the last 72 hours. Cardiac Enzymes No results for input(s): CKTOTAL, CKMB, CKMBINDEX, TROPONINI in the last 72 hours.  BNP: BNP (last 3 results) Recent Labs    12/14/20 1921 02/27/21 1605 03/01/21 2125  BNP 209.0* 320.5* 194.0*    ProBNP (last 3 results) No results for input(s): PROBNP in the last 8760 hours.   D-Dimer No results for input(s): DDIMER in the last 72 hours. Hemoglobin A1C No results for input(s): HGBA1C in the last 72 hours. Fasting Lipid Panel No results for input(s): CHOL, HDL, LDLCALC, TRIG, CHOLHDL, LDLDIRECT in the last 72 hours. Thyroid Function Tests No results for input(s): TSH, T4TOTAL, T3FREE, THYROIDAB in the last 72 hours.  Invalid input(s): FREET3  Other results:   Imaging    No results   found.   Medications:     Scheduled Medications: . amiodarone  400 mg Oral BID  . carvedilol  6.25 mg Oral BID WC  . clopidogrel  75 mg Oral Q breakfast  . dapagliflozin propanediol  10 mg Oral Daily  . digoxin  0.125 mg Oral Daily  . ezetimibe  10 mg Oral Daily  . isosorbide mononitrate  30 mg Oral Daily  . melatonin  3 mg Oral QHS  . mexiletine  150 mg Oral Q8H  . sacubitril-valsartan  1 tablet Oral BID  . [START ON 03/05/2021] sodium chloride flush  3 mL Intravenous Q12H  . spironolactone  25 mg Oral Daily  . torsemide  80 mg Oral Daily    Infusions:   PRN Medications: acetaminophen, nitroGLYCERIN, oxyCODONE-acetaminophen **AND** oxyCODONE, prochlorperazine, traZODone   Assessment/Plan   1. VT: ?Scar-mediated VT versus ischemia.  Prior VT in 1/22.  Had PCI to LCx in 1/22. HS-TnI up to 3070,  ?demand ischemia with VT and shocks x 4 versus true ACS.  No chest pain currently.  - Reload amiodarone 400 mg bid.  - Continue mexiletine.   - Will need RHC/LHC, plan for Monday if INR < 2.   - Continue Plavix, hold warfarin and start heparin gtt when INR < 2.  - Off ASA given anticoagulation.  - EP evaluation after cath, ?VT ablation if no new coronary disease.  2 CAD: MI in 2008 in [facility], cath at the time showed totally occluded diagonal, totally occluded LCX, and diffuse LAD disease.  No intervention. LHC was done in 12/21 due to VT, this showed chronically occluded D1 and 95% proximal LCx.  Initially, no PCI due to thrombocytopenia.  She eventually had PCI to proximal LCx in 1/22.  She has had frequent mild episodes of nitrate responsive chest pain since her PCI. Antianginal regimen recently adjusted in outpatient clinic. Imdur added. Now w/ VT treated w/ ICD shock x 4.  Hs Trop 2,161=>3,070. EKG no acute ST changes. ?ACS/NSTEMI versus demand ischemia with VT/shocks. - Plan LHC once INR <2. Hold coumadin. Start IV heparin once INR < 2.0 (2.3 today).  - Continue Zetia, has not tolerated statins.  Good lipids in 12/21.   - Continue Plavix 75 mg daily x at least 6 months post PCI and ideally 1 year. - She is off ASA given use of warfarin and Plavix.  3. Atrial fibrillation: TEE-guided DCCV to NSR in 12/21.  She is in NSR. No breakthrough Afib on device interrogation.  - continue amiodarone  - If atrial fibrillation recurs, will need to consider Tikosyn versus ablation.  - Hold Coumadin for cath. IV heparin once INR <2.0  4. Carotid stenosis: Known RICA occlusion. Dopplers in 12/21 with <50% LICA.  4. 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  - She is on chronic coumadin. INR 2.3. Hold coumadin for cath. IV heparin once INR <2.0 - Will need Lovenox bridge once invasive procedures completed  6. Chronic systolic CHF: Ischemic cardiomyopathy.  [employer] ICD.  Echo (8/21) with  EF 15-20%, anteroseptal AK, RV normal, severe LAE, mild MR.  TEE (12/21) with EF 25-30%.  RHC in 12/21 with preserved cardiac output and optimized filling pressures.  NYHA class II symptoms, not volume overloaded on exam.   - Continue Entresto 24-26 bid (BP too soft for titration)  - Continue Farxiga 10 mg daily.   - Continue spironolactone 25 mg daily.  - Continue Coreg 6.25 mg bid.  - Continue torsemide 80 mg daily -  Continue digoxin. Dig level 0.7  7. Smoking: Active smoker, wants to stop on her own.  8. PAD: She denies claudication.  - Needs to quit smoking.  9. 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  Not low this admission.  - She will see Dr. Truett Perna (hematology).  10. Mitral regurgitation: Moderate on 12/21 TEE, suspect functional.   Length of Stay: 2  [name], MD  03/04/2021, 8:56 AM  Advanced Heart Failure Team Pager [phone] (M-F; 7a - 4p)  Please contact [facility] for night-coverage after hours (4p -7a ) and weekends on [email or web address]

## 2021-03-04 NOTE — H&P (View-Only) (Signed)
Patient ID: [name] [name], female   DOB: 08-11-1961, 60 y.o.   MRN: [id number]     Advanced Heart Failure Rounding Note  PCP-Cardiologist: Nona Dell, MD   Subjective:    Admitted  for ICD shocks x 4 for VT. Has had recent nitrate responsive CP. Has ruled in for NSTEMI.   Hs Trop 2,161>>3,070. EKG no acute ST changes.   On Coumadin for anti-phospholipid antibody syndrome, now held.  INR 2.3 today (trending down).   No further VT on telemetry.  No complaints, walked in room yesterday.   Objective:   Weight Range: 87.2 kg Body mass index is 31.99 kg/m.   Vital Signs:   Temp:  [97.6 F (36.4 C)-98.2 F (36.8 C)] 98.1 F (36.7 C) (03/06 0748) Pulse Rate:  [57-76] 74 (03/06 0748) Resp:  [11-18] 18 (03/06 0748) BP: (98-124)/(48-76) 101/76 (03/06 0748) SpO2:  [93 %-96 %] 95 % (03/06 0748) Last BM Date: 03/02/21  Weight change: Filed Weights   03/01/21 2051 03/02/21 1241  Weight: 86.2 kg 87.2 kg    Intake/Output:   Intake/Output Summary (Last 24 hours) at 03/04/2021 0856 Last data filed at 03/04/2021 0749 Gross per 24 hour  Intake 360 ml  Output 3250 ml  Net -2890 ml      Physical Exam    General: NAD Neck: No JVD, no thyromegaly or thyroid nodule.  Lungs: Clear to auscultation bilaterally with normal respiratory effort. CV: Nondisplaced PMI.  Heart regular S1/S2, no S3/S4, no murmur.  No peripheral edema.   Abdomen: Soft, nontender, no hepatosplenomegaly, no distention.  Skin: Intact without lesions or rashes.  Neurologic: Alert and oriented x 3.  Psych: Normal affect. Extremities: No clubbing or cyanosis.  HEENT: Normal.    Telemetry   NSR, no VT.  Personally reviewed.    Labs    CBC Recent Labs    03/01/21 2125 03/03/21 0050 03/04/21 0206  WBC 13.5* 6.8 9.0  NEUTROABS 10.3*  --   --   HGB 14.9 13.0 14.2  HCT 46.1* 38.8 43.8  MCV 89.7 89.8 91.6  PLT 193 206 223   Basic Metabolic Panel Recent Labs    68/08/81 0444 03/03/21 0050  03/04/21 0206  NA  --  134* 137  K  --  3.8 3.5  CL  --  101 97*  CO2  --  24 27  GLUCOSE  --  86 91  BUN  --  17 13  CREATININE  --  0.81 0.85  CALCIUM  --  8.6* 9.2  MG 2.2  --  2.3   Liver Function Tests No results for input(s): AST, ALT, ALKPHOS, BILITOT, PROT, ALBUMIN in the last 72 hours. No results for input(s): LIPASE, AMYLASE in the last 72 hours. Cardiac Enzymes No results for input(s): CKTOTAL, CKMB, CKMBINDEX, TROPONINI in the last 72 hours.  BNP: BNP (last 3 results) Recent Labs    12/14/20 1921 02/27/21 1605 03/01/21 2125  BNP 209.0* 320.5* 194.0*    ProBNP (last 3 results) No results for input(s): PROBNP in the last 8760 hours.   D-Dimer No results for input(s): DDIMER in the last 72 hours. Hemoglobin A1C No results for input(s): HGBA1C in the last 72 hours. Fasting Lipid Panel No results for input(s): CHOL, HDL, LDLCALC, TRIG, CHOLHDL, LDLDIRECT in the last 72 hours. Thyroid Function Tests No results for input(s): TSH, T4TOTAL, T3FREE, THYROIDAB in the last 72 hours.  Invalid input(s): FREET3  Other results:   Imaging    No results  found.   Medications:     Scheduled Medications: . amiodarone  400 mg Oral BID  . carvedilol  6.25 mg Oral BID WC  . clopidogrel  75 mg Oral Q breakfast  . dapagliflozin propanediol  10 mg Oral Daily  . digoxin  0.125 mg Oral Daily  . ezetimibe  10 mg Oral Daily  . isosorbide mononitrate  30 mg Oral Daily  . melatonin  3 mg Oral QHS  . mexiletine  150 mg Oral Q8H  . sacubitril-valsartan  1 tablet Oral BID  . [START ON 03/05/2021] sodium chloride flush  3 mL Intravenous Q12H  . spironolactone  25 mg Oral Daily  . torsemide  80 mg Oral Daily    Infusions:   PRN Medications: acetaminophen, nitroGLYCERIN, oxyCODONE-acetaminophen **AND** oxyCODONE, prochlorperazine, traZODone   Assessment/Plan   1. VT: ?Scar-mediated VT versus ischemia.  Prior VT in 1/22.  Had PCI to LCx in 1/22. HS-TnI up to 3070,  ?demand ischemia with VT and shocks x 4 versus true ACS.  No chest pain currently.  - Reload amiodarone 400 mg bid.  - Continue mexiletine.   - Will need RHC/LHC, plan for Monday if INR < 2.   - Continue Plavix, hold warfarin and start heparin gtt when INR < 2.  - Off ASA given anticoagulation.  - EP evaluation after cath, ?VT ablation if no new coronary disease.  2 CAD: MI in 2008 in [facility], cath at the time showed totally occluded diagonal, totally occluded LCX, and diffuse LAD disease.  No intervention. LHC was done in 12/21 due to VT, this showed chronically occluded D1 and 95% proximal LCx.  Initially, no PCI due to thrombocytopenia.  She eventually had PCI to proximal LCx in 1/22.  She has had frequent mild episodes of nitrate responsive chest pain since her PCI. Antianginal regimen recently adjusted in outpatient clinic. Imdur added. Now w/ VT treated w/ ICD shock x 4.  Hs Trop 2,161=>3,070. EKG no acute ST changes. ?ACS/NSTEMI versus demand ischemia with VT/shocks. - Plan LHC once INR <2. Hold coumadin. Start IV heparin once INR < 2.0 (2.3 today).  - Continue Zetia, has not tolerated statins.  Good lipids in 12/21.   - Continue Plavix 75 mg daily x at least 6 months post PCI and ideally 1 year. - She is off ASA given use of warfarin and Plavix.  3. Atrial fibrillation: TEE-guided DCCV to NSR in 12/21.  She is in NSR. No breakthrough Afib on device interrogation.  - continue amiodarone  - If atrial fibrillation recurs, will need to consider Tikosyn versus ablation.  - Hold Coumadin for cath. IV heparin once INR <2.0  4. Carotid stenosis: Known RICA occlusion. Dopplers in 12/21 with <50% LICA.  4. 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  - She is on chronic coumadin. INR 2.3. Hold coumadin for cath. IV heparin once INR <2.0 - Will need Lovenox bridge once invasive procedures completed  6. Chronic systolic CHF: Ischemic cardiomyopathy.  [employer] ICD.  Echo (8/21) with  EF 15-20%, anteroseptal AK, RV normal, severe LAE, mild MR.  TEE (12/21) with EF 25-30%.  RHC in 12/21 with preserved cardiac output and optimized filling pressures.  NYHA class II symptoms, not volume overloaded on exam.   - Continue Entresto 24-26 bid (BP too soft for titration)  - Continue Farxiga 10 mg daily.   - Continue spironolactone 25 mg daily.  - Continue Coreg 6.25 mg bid.  - Continue torsemide 80 mg daily -  Continue digoxin. Dig level 0.7  7. Smoking: Active smoker, wants to stop on her own.  8. PAD: She denies claudication.  - Needs to quit smoking.  9. 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  Not low this admission.  - She will see Dr. Truett Perna (hematology).  10. Mitral regurgitation: Moderate on 12/21 TEE, suspect functional.   Length of Stay: 2  [name], MD  03/04/2021, 8:56 AM  Advanced Heart Failure Team Pager [phone] (M-F; 7a - 4p)  Please contact [facility] for night-coverage after hours (4p -7a ) and weekends on [email or web address]

## 2021-03-05 ENCOUNTER — Encounter (HOSPITAL_COMMUNITY): Payer: Self-pay | Admitting: Cardiology

## 2021-03-05 ENCOUNTER — Encounter (HOSPITAL_COMMUNITY)
Admission: EM | Disposition: A | Discharge status: Home / Self Care | Payer: Self-pay | Source: Home / Self Care | Attending: Cardiology

## 2021-03-05 ENCOUNTER — Inpatient Hospital Stay (HOSPITAL_COMMUNITY): Payer: Medicare PPO

## 2021-03-05 DIAGNOSIS — I5022 Chronic systolic (congestive) heart failure: Secondary | ICD-10-CM

## 2021-03-05 DIAGNOSIS — I472 Ventricular tachycardia: Secondary | ICD-10-CM

## 2021-03-05 DIAGNOSIS — T82191A Other mechanical complication of cardiac pulse generator (battery), initial encounter: Secondary | ICD-10-CM

## 2021-03-05 DIAGNOSIS — I63239 Cerebral infarction due to unspecified occlusion or stenosis of unspecified carotid arteries: Secondary | ICD-10-CM

## 2021-03-05 DIAGNOSIS — I429 Cardiomyopathy, unspecified: Secondary | ICD-10-CM

## 2021-03-05 DIAGNOSIS — I639 Cerebral infarction, unspecified: Secondary | ICD-10-CM

## 2021-03-05 DIAGNOSIS — I5042 Chronic combined systolic (congestive) and diastolic (congestive) heart failure: Secondary | ICD-10-CM | POA: Diagnosis not present

## 2021-03-05 HISTORY — PX: RIGHT/LEFT HEART CATH AND CORONARY ANGIOGRAPHY: CATH118266

## 2021-03-05 LAB — CUP PACEART REMOTE DEVICE CHECK
Battery Remaining Longevity: 96 mo
Battery Remaining Percentage: 100 %
Brady Statistic RA Percent Paced: 0 %
Brady Statistic RV Percent Paced: 0 %
Date Time Interrogation Session: 20220303021100
HighPow Impedance: 60 Ohm
Implantable Lead Implant Date: 20081119
Implantable Lead Implant Date: 20081119
Implantable Lead Location: 753859
Implantable Lead Location: 753860
Implantable Lead Model: 185
Implantable Lead Model: 4136
Implantable Lead Serial Number: 211124
Implantable Lead Serial Number: 28383796
Implantable Pulse Generator Implant Date: 20170303
Lead Channel Impedance Value: 539 Ohm
Lead Channel Impedance Value: 572 Ohm
Lead Channel Pacing Threshold Amplitude: 0.6 V
Lead Channel Pacing Threshold Amplitude: 1 V
Lead Channel Pacing Threshold Pulse Width: 0.5 ms
Lead Channel Pacing Threshold Pulse Width: 0.5 ms
Lead Channel Setting Pacing Amplitude: 2 V
Lead Channel Setting Pacing Amplitude: 2.4 V
Lead Channel Setting Pacing Pulse Width: 0.5 ms
Lead Channel Setting Sensing Sensitivity: 0.6 mV
Pulse Gen Serial Number: 204398

## 2021-03-05 LAB — POCT I-STAT EG7
Acid-Base Excess: 3 mmol/L — ABNORMAL HIGH (ref 0.0–2.0)
Acid-Base Excess: 3 mmol/L — ABNORMAL HIGH (ref 0.0–2.0)
Bicarbonate: 28.9 mmol/L — ABNORMAL HIGH (ref 20.0–28.0)
Bicarbonate: 29 mmol/L — ABNORMAL HIGH (ref 20.0–28.0)
Calcium, Ion: 1.16 mmol/L (ref 1.15–1.40)
Calcium, Ion: 1.19 mmol/L (ref 1.15–1.40)
HCT: 41 % (ref 36.0–46.0)
HCT: 41 % (ref 36.0–46.0)
Hemoglobin: 13.9 g/dL (ref 12.0–15.0)
Hemoglobin: 13.9 g/dL (ref 12.0–15.0)
O2 Saturation: 72 %
O2 Saturation: 73 %
Potassium: 3.9 mmol/L (ref 3.5–5.1)
Potassium: 4.1 mmol/L (ref 3.5–5.1)
Sodium: 139 mmol/L (ref 135–145)
Sodium: 139 mmol/L (ref 135–145)
TCO2: 30 mmol/L (ref 22–32)
TCO2: 30 mmol/L (ref 22–32)
pCO2, Ven: 48.3 mmHg (ref 44.0–60.0)
pCO2, Ven: 48.4 mmHg (ref 44.0–60.0)
pH, Ven: 7.385 (ref 7.250–7.430)
pH, Ven: 7.386 (ref 7.250–7.430)
pO2, Ven: 39 mmHg (ref 32.0–45.0)
pO2, Ven: 40 mmHg (ref 32.0–45.0)

## 2021-03-05 LAB — MAGNESIUM: Magnesium: 2.1 mg/dL (ref 1.7–2.4)

## 2021-03-05 LAB — PROTIME-INR
INR: 1.6 — ABNORMAL HIGH (ref 0.8–1.2)
Prothrombin Time: 18.7 seconds — ABNORMAL HIGH (ref 11.4–15.2)

## 2021-03-05 LAB — CBC
HCT: 42.1 % (ref 36.0–46.0)
Hemoglobin: 14 g/dL (ref 12.0–15.0)
MCH: 30.1 pg (ref 26.0–34.0)
MCHC: 33.3 g/dL (ref 30.0–36.0)
MCV: 90.5 fL (ref 80.0–100.0)
Platelets: 205 10*3/uL (ref 150–400)
RBC: 4.65 MIL/uL (ref 3.87–5.11)
RDW: 12.8 % (ref 11.5–15.5)
WBC: 8 10*3/uL (ref 4.0–10.5)
nRBC: 0 % (ref 0.0–0.2)

## 2021-03-05 LAB — HEPARIN LEVEL (UNFRACTIONATED): Heparin Unfractionated: 0.11 IU/mL — ABNORMAL LOW (ref 0.30–0.70)

## 2021-03-05 LAB — BASIC METABOLIC PANEL
Anion gap: 11 (ref 5–15)
BUN: 25 mg/dL — ABNORMAL HIGH (ref 6–20)
CO2: 24 mmol/L (ref 22–32)
Calcium: 8.8 mg/dL — ABNORMAL LOW (ref 8.9–10.3)
Chloride: 98 mmol/L (ref 98–111)
Creatinine, Ser: 1.04 mg/dL — ABNORMAL HIGH (ref 0.44–1.00)
GFR, Estimated: >60 mL/min (ref >60)
Glucose, Bld: 110 mg/dL — ABNORMAL HIGH (ref 70–99)
Potassium: 3.7 mmol/L (ref 3.5–5.1)
Sodium: 133 mmol/L — ABNORMAL LOW (ref 135–145)

## 2021-03-05 SURGERY — RIGHT/LEFT HEART CATH AND CORONARY ANGIOGRAPHY
Anesthesia: LOCAL

## 2021-03-05 MED ORDER — MIDAZOLAM HCL 2 MG/2ML IJ SOLN
INTRAMUSCULAR | Status: DC | PRN
  Administered 2021-03-05 (×2): 1 mg via INTRAVENOUS

## 2021-03-05 MED ORDER — HEPARIN (PORCINE) IN NACL 1000-0.9 UT/500ML-% IV SOLN
INTRAVENOUS | Status: DC | PRN
  Administered 2021-03-05: 500 mL

## 2021-03-05 MED ORDER — POTASSIUM CHLORIDE CRYS ER 20 MEQ PO TBCR
40.0000 meq | EXTENDED_RELEASE_TABLET | Freq: Once | ORAL | Status: AC
  Administered 2021-03-05: 40 meq via ORAL
  Filled 2021-03-05: qty 2

## 2021-03-05 MED ORDER — FENTANYL CITRATE (PF) 100 MCG/2ML IJ SOLN
INTRAMUSCULAR | Status: AC
  Filled 2021-03-05: qty 2

## 2021-03-05 MED ORDER — RANOLAZINE ER 500 MG PO TB12
500.0000 mg | ORAL_TABLET | Freq: Two times a day (BID) | ORAL | Status: DC
  Administered 2021-03-05 – 2021-03-15 (×19): 500 mg via ORAL
  Filled 2021-03-05 (×19): qty 1

## 2021-03-05 MED ORDER — HEPARIN (PORCINE) 25000 UT/250ML-% IV SOLN
1300.0000 [IU]/h | INTRAVENOUS | Status: DC
  Administered 2021-03-05: 950 [IU]/h via INTRAVENOUS
  Administered 2021-03-06 – 2021-03-07 (×2): 1300 [IU]/h via INTRAVENOUS
  Filled 2021-03-05 (×4): qty 250

## 2021-03-05 MED ORDER — SODIUM CHLORIDE 0.9 % IV SOLN: INTRAVENOUS | Status: AC

## 2021-03-05 MED ORDER — TORSEMIDE 20 MG PO TABS
60.0000 mg | ORAL_TABLET | Freq: Every day | ORAL | Status: DC
  Administered 2021-03-06 – 2021-03-07 (×2): 60 mg via ORAL
  Filled 2021-03-05 (×2): qty 3

## 2021-03-05 MED ORDER — VERAPAMIL HCL 2.5 MG/ML IV SOLN
INTRAVENOUS | Status: DC | PRN
  Administered 2021-03-05: 10 mL via INTRA_ARTERIAL

## 2021-03-05 MED ORDER — LABETALOL HCL 5 MG/ML IV SOLN: 10.0000 mg | INTRAVENOUS | Status: AC | PRN

## 2021-03-05 MED ORDER — SODIUM CHLORIDE 0.9% FLUSH: 3.0000 mL | INTRAVENOUS | Status: DC | PRN

## 2021-03-05 MED ORDER — VERAPAMIL HCL 2.5 MG/ML IV SOLN
INTRAVENOUS | Status: AC
  Filled 2021-03-05: qty 2

## 2021-03-05 MED ORDER — ONDANSETRON HCL 4 MG/2ML IJ SOLN
4.0000 mg | Freq: Four times a day (QID) | INTRAMUSCULAR | Status: DC | PRN
  Administered 2021-03-05 – 2021-03-08 (×8): 4 mg via INTRAVENOUS
  Filled 2021-03-05 (×8): qty 2

## 2021-03-05 MED ORDER — SODIUM CHLORIDE 0.9% FLUSH
3.0000 mL | Freq: Two times a day (BID) | INTRAVENOUS | Status: DC
  Administered 2021-03-05 – 2021-03-13 (×11): 3 mL via INTRAVENOUS

## 2021-03-05 MED ORDER — ACETAMINOPHEN 325 MG PO TABS: 650.0000 mg | ORAL_TABLET | ORAL | Status: DC | PRN

## 2021-03-05 MED ORDER — HEPARIN SODIUM (PORCINE) 1000 UNIT/ML IJ SOLN
INTRAMUSCULAR | Status: DC | PRN
  Administered 2021-03-05: 4000 [IU] via INTRAVENOUS

## 2021-03-05 MED ORDER — LIDOCAINE HCL (PF) 1 % IJ SOLN
INTRAMUSCULAR | Status: DC | PRN
  Administered 2021-03-05: 2 mL

## 2021-03-05 MED ORDER — HEPARIN (PORCINE) IN NACL 1000-0.9 UT/500ML-% IV SOLN
INTRAVENOUS | Status: AC
  Filled 2021-03-05: qty 1000

## 2021-03-05 MED ORDER — SODIUM CHLORIDE 0.9 % IV SOLN: 250.0000 mL | INTRAVENOUS | Status: DC | PRN

## 2021-03-05 MED ORDER — FENTANYL CITRATE (PF) 100 MCG/2ML IJ SOLN
INTRAMUSCULAR | Status: DC | PRN
  Administered 2021-03-05 (×2): 25 ug via INTRAVENOUS

## 2021-03-05 MED ORDER — TORSEMIDE 20 MG PO TABS
60.0000 mg | ORAL_TABLET | Freq: Once | ORAL | Status: AC
  Administered 2021-03-05: 60 mg via ORAL
  Filled 2021-03-05: qty 3

## 2021-03-05 MED ORDER — MIDAZOLAM HCL 2 MG/2ML IJ SOLN
INTRAMUSCULAR | Status: AC
  Filled 2021-03-05: qty 2

## 2021-03-05 MED ORDER — LIDOCAINE HCL (PF) 1 % IJ SOLN
INTRAMUSCULAR | Status: AC
  Filled 2021-03-05: qty 30

## 2021-03-05 MED ORDER — HYDRALAZINE HCL 20 MG/ML IJ SOLN: 10.0000 mg | INTRAMUSCULAR | Status: AC | PRN

## 2021-03-05 MED ORDER — MAGNESIUM HYDROXIDE 400 MG/5ML PO SUSP
30.0000 mL | Freq: Every day | ORAL | Status: DC | PRN
  Administered 2021-03-05 – 2021-03-13 (×6): 30 mL via ORAL
  Filled 2021-03-05 (×6): qty 30

## 2021-03-05 MED ORDER — HEPARIN SODIUM (PORCINE) 1000 UNIT/ML IJ SOLN
INTRAMUSCULAR | Status: AC
  Filled 2021-03-05: qty 1

## 2021-03-05 SURGICAL SUPPLY — 11 items
CATH 5FR JL3.5 JR4 ANG PIG MP (CATHETERS) ×2 IMPLANT
CATH BALLN WEDGE 5F 110CM (CATHETERS) ×2 IMPLANT
CATH INFINITI 5 FR 3DRC (CATHETERS) ×2 IMPLANT
DEVICE RAD COMP TR BAND LRG (VASCULAR PRODUCTS) ×2 IMPLANT
GLIDESHEATH SLEND SS 6F .021 (SHEATH) ×2 IMPLANT
GUIDEWIRE INQWIRE 1.5J.035X260 (WIRE) ×1 IMPLANT
INQWIRE 1.5J .035X260CM (WIRE) ×2
KIT HEART LEFT (KITS) ×2 IMPLANT
PACK CARDIAC CATHETERIZATION (CUSTOM PROCEDURE TRAY) ×2 IMPLANT
SHEATH GLIDE SLENDER 4/5FR (SHEATH) ×2 IMPLANT
TRANSDUCER W/STOPCOCK (MISCELLANEOUS) ×2 IMPLANT

## 2021-03-05 NOTE — Progress Notes (Signed)
Patient went to the cath lab today. Returned with TR band with 13 air. Band removal was uneventful, site level 0, throughout and after removal. Patient does have bruising along the medial side of right wrist from previous failed IV attempt.

## 2021-03-05 NOTE — Consult Note (Signed)
ELECTROPHYSIOLOGY CONSULT NOTE    Patient ID: [name] [name] MRN: [id number], DOB/AGE: November 09, 1961 60 y.o.  Admit date: 03/01/2021 Date of Consult: 03/05/2021  Primary Physician: Ignatius Specking, MD Primary Cardiologist: Nona Dell, MD  Electrophysiologist: Dr. Ladona Ridgel  Referring Provider: Dr. Shirlee Latch  Patient Profile: [name] [name] is a 60 y.o. female with a history of of CAD (PCI of LCx in Jan 2022), ICM (s/p [employer] dual chamber ICD implant 2017), cocaine abuse (in remission), CVA, AFIB, antiphospholipid syndrome (on Coumadin), who presented to the hospital for worsening SOB and 4 shocks from her ICD who is being seen today for the evaluation of recurrent VT at the request of Dr. Shirlee Latch.  HPI:  [name] [name] is a 60 y.o. female with medical history as above. She was seen in EP and HF clinic 3/1. She complained of episode of nitro responsive chest pain and was started on imdur with 1 week follow up.   She presented to [facility] 03/02/21 with ICD shock x 4. She was ruled in for NSTEMI with nitro responsive CP and elevated HS-Trop. Amiodarone increased to 400 mg BID  Pertinent labs on admission include K 3.4, Mg 2.2, Cr 1.22 (mild AKI), WBC 13.5, Hgb 14.9, COVID negative. HS trop 2161 -> 3070   She underwent LHC this am that showed low filling pressures, preserved cardiac output, and stable cors with patent LCx stent and known chronic occlusion of D1.  Currently she is feeling OK at rest. She used the shower chair yesterday evening. She has been SOB at home walking around and bathing x 1 week. She is frustrated as she doesn't understand what is causing it.   Past Medical History:  Diagnosis Date  . Aneurysm of internal carotid artery   . Anticardiolipin antibody positive    Chronic Coumadin  . Brain aneurysm    followed by Dr. Corliss Skains  . Chronic combined systolic (congestive) and diastolic (congestive) heart failure (HCC)   . Cocaine abuse in remission (HCC)   . Coronary  atherosclerosis of native coronary artery    Previous invasive cardiac testing was done at a facility in [place], [place].  Occluded diagonal, Diffuse LAD disease, Occluded Cx, and non obstructive RCA.   . Essential hypertension   . Headache   . History of pneumonia   . ICD (implantable cardioverter-defibrillator) in place   . Ischemic cardiomyopathy    LVEF 30-35%  . Mixed hyperlipidemia   . NSVT (nonsustained ventricular tachycardia) (HCC)   . Persistent atrial fibrillation (HCC)   . PVD (peripheral vascular disease) (HCC)   . Raynaud's phenomenon   . Statin intolerance   . Stroke [facility]) 2007   Total occlusion of the right internal carotid     Surgical History:  Past Surgical History:  Procedure Laterality Date  . ABDOMINAL AORTAGRAM N/A 05/25/2014   Procedure: ABDOMINAL Ronny Flurry;  Surgeon: Iran Ouch, MD;  Location: [facility] CATH LAB;  Service: Cardiovascular;  Laterality: N/A;  . APPENDECTOMY    . CARDIAC DEFIBRILLATOR PLACEMENT     [employer] ICD  . CARDIOVERSION N/A 12/01/2020   Procedure: CARDIOVERSION;  Surgeon: [name], MD;  Location: [facility] ENDOSCOPY;  Service: Cardiovascular;  Laterality: N/A;  . CORONARY STENT INTERVENTION N/A 01/08/2021   Procedure: CORONARY STENT INTERVENTION;  Surgeon: Swaziland, Peter M, MD;  Location: [facility] INVASIVE CV LAB;  Service: Cardiovascular;  Laterality: N/A;  . EP IMPLANTABLE DEVICE N/A 03/01/2016   Procedure: ICD Generator Changeout;  Surgeon: [name], MD;  Location: [facility] INVASIVE CV LAB;  Service: Cardiovascular;  Laterality: N/A;  . L1 corpectomy   12/10  . Laryngeal polyp excision    . RIGHT/LEFT HEART CATH AND CORONARY ANGIOGRAPHY N/A 12/18/2020   Procedure: RIGHT/LEFT HEART CATH AND CORONARY ANGIOGRAPHY;  Surgeon: Dolores PattyBensimhon, Daniel R, MD;  Location: [facility] INVASIVE CV LAB;  Service: Cardiovascular;  Laterality: N/A;  . TEE WITHOUT CARDIOVERSION N/A 12/01/2020   Procedure: TRANSESOPHAGEAL ECHOCARDIOGRAM (TEE);  Surgeon: Laurey MoraleMcLean, Dalton S,  MD;  [facility];  Service: Cardiovascular;  Laterality: N/A;     Medications Prior to Admission  Medication Sig Dispense Refill Last Dose  . amiodarone (PACERONE) 200 MG tablet Take 1 tablet (200 mg total) by mouth daily. 90 tablet 3 03/01/2021 at Unknown time  . carvedilol (COREG) 12.5 MG tablet Take 6.25 mg by mouth 2 (two) times daily.   03/01/2021 at 1930  . clopidogrel (PLAVIX) 75 MG tablet Take 1 tablet (75 mg total) by mouth daily with breakfast. 30 tablet 11 03/01/2021 at Unknown time  . dapagliflozin propanediol (FARXIGA) 10 MG TABS tablet Take 10 mg by mouth daily.   03/01/2021 at Unknown time  . digoxin (LANOXIN) 0.125 MG tablet Take by mouth.   03/01/2021 at Unknown time  . DULoxetine (CYMBALTA) 30 MG capsule Take 30 mg by mouth daily.   03/01/2021 at Unknown time  . DULoxetine (CYMBALTA) 60 MG capsule Take 60 mg by mouth every evening.   03/01/2021 at Unknown time  . empagliflozin (JARDIANCE) 10 MG TABS tablet Take 1 tablet (10 mg total) by mouth daily before breakfast. 30 tablet 3 03/01/2021 at Unknown time  . ezetimibe (ZETIA) 10 MG tablet Take 1 tablet (10 mg total) by mouth daily. 30 tablet 3 03/01/2021 at Unknown time  . famotidine (PEPCID) 20 MG tablet Take 1 tablet (20 mg total) by mouth 2 (two) times daily. (Patient taking differently: Take 20 mg by mouth daily.) 60 tablet 1 03/01/2021 at Unknown time  . fluticasone (FLONASE) 50 MCG/ACT nasal spray Place 1 spray into both nostrils daily as needed for allergies.    unk  . gabapentin (NEURONTIN) 300 MG capsule Take 300 mg by mouth See admin instructions. Bid x 14 days, then qd x 14 days then stop   03/01/2021 at Unknown time  . isosorbide mononitrate (IMDUR) 30 MG 24 hr tablet Take 1 tablet (30 mg total) by mouth daily. 90 tablet 3 03/01/2021 at Unknown time  . methocarbamol (ROBAXIN) 750 MG tablet Take 750 mg by mouth every 8 (eight) hours as needed for muscle spasms.   Past Week at Unknown time  . mexiletine (MEXITIL) 150 MG capsule Take  1 capsule (150 mg total) by mouth every 8 (eight) hours. 90 capsule 5 03/01/2021 at Unknown time  . Multiple Vitamins-Minerals (MULTIVITAMINS THER. W/MINERALS) TABS Take 1 tablet by mouth daily.     03/01/2021 at Unknown time  . nitroGLYCERIN (NITROSTAT) 0.4 MG SL tablet PLACE 1 TABLET UNDER THE TONGUE EVERY 5 MINUTES FOR 3 DOSES AS NEEDED CHEST PAIN (Patient taking differently: Place 0.4 mg under the tongue every 5 (five) minutes as needed for chest pain.) 25 tablet 3 02/27/2021 at Unknown time  . oxyCODONE-acetaminophen (PERCOCET) 10-325 MG tablet Take 1 tablet by mouth every 4 (four) hours as needed for pain.   02/27/2021 at Unknown time  . potassium chloride SA (KLOR-CON) 20 MEQ tablet Take 2 tablets (40 mEq total) by mouth daily. 180 tablet 1 03/01/2021 at Unknown time  . promethazine (PHENERGAN) 25 MG  tablet Take 25 mg by mouth every 6 (six) hours as needed for nausea. With pain medication   02/27/2021  . sacubitril-valsartan (ENTRESTO) 24-26 MG Take 1 tablet by mouth 2 (two) times daily.   03/01/2021 at Unknown time  . spironolactone (ALDACTONE) 25 MG tablet Take 1 tablet (25 mg total) by mouth daily. 30 tablet 3 03/01/2021 at Unknown time  . torsemide (DEMADEX) 20 MG tablet TAKE 4 TABLETS BY MOUTH EVERY DAY. MAY TAKE EXTRA TABLET AS NEEDED (Patient taking differently: Take 80 mg by mouth daily.) 135 tablet 3 03/01/2021 at Unknown time  . warfarin (COUMADIN) 10 MG tablet TAKE 1/2 TABLET BY MOUTH EVERY DAY EXCEPT 1 TABLET ON SUNDAYS AND THURSDAYS (Patient taking differently: Take 5-10 mg by mouth See admin instructions. 5 mg Monday,Tuesday,Wednesday,Friday,Saturday. 10 mg Sunday and thursday) 30 tablet 3 03/01/2021 at 1930  . zolpidem (AMBIEN) 10 MG tablet Take 10 mg by mouth at bedtime as needed for sleep.   Past Month at Unknown time  . carvedilol (COREG) 6.25 MG tablet Take 1 tablet (6.25 mg total) by mouth 2 (two) times daily with a meal. (Patient not taking: No sig reported) 60 tablet 5 Not Taking at Unknown time   . doxycycline (VIBRA-TABS) 100 MG tablet Take 100 mg by mouth See admin instructions. Bid x 7 days (Patient not taking: Reported on 03/02/2021)   Completed Course at Unknown time    Inpatient Medications:  . amiodarone  400 mg Oral BID  . carvedilol  6.25 mg Oral BID WC  . clopidogrel  75 mg Oral Q breakfast  . dapagliflozin propanediol  10 mg Oral Daily  . digoxin  0.125 mg Oral Daily  . ezetimibe  10 mg Oral Daily  . isosorbide mononitrate  30 mg Oral Daily  . melatonin  3 mg Oral QHS  . mexiletine  150 mg Oral Q8H  . sacubitril-valsartan  1 tablet Oral BID  . sodium chloride flush  3 mL Intravenous Q12H  . spironolactone  25 mg Oral Daily  . torsemide  80 mg Oral Daily    Allergies:  Allergies  Allergen Reactions  . Beef-Derived Products Anaphylaxis    From tick bite  . Metoclopramide Hcl Anaphylaxis    Non responsive  . Penicillins Anaphylaxis    Pt states she can take any cephalosporin (per pt. 01/01/19) Shock Has patient had a PCN reaction causing immediate rash, facial/tongue/throat swelling, SOB or lightheadedness with hypotension: Yes Has patient had a PCN reaction causing severe rash involving mucus membranes or skin necrosis: No Has patient had a PCN reaction that required hospitalization Yes Has patient had a PCN reaction occurring within the last 10 years: No If all of the above answers are "NO", then may proceed with Cephalosporin   . Pork-Derived Products Anaphylaxis    From tick bite. Has tolerated heparin/lovenox  . Metoprolol Other (See Comments)    Syncope   . Reglan [Metoclopramide]   . Statins Other (See Comments)    Myalgias    Social History   Socioeconomic History  . Marital status: Single    Spouse name: Not on file  . Number of children: Not on file  . Years of education: Not on file  . Highest education level: Not on file  Occupational History  . Occupation: Disabled    Comment: ESL  Tobacco Use  . Smoking status: Current Every Day  Smoker    Packs/day: 1.00    Years: 43.00    Pack years: 43.00  Types: Cigarettes    Start date: 03/03/1978  . Smokeless tobacco: Never Used  . Tobacco comment: 4 ciggs per day  Vaping Use  . Vaping Use: Every day  Substance and Sexual Activity  . Alcohol use: No    Alcohol/week: 0.0 standard drinks  . Drug use: No    Comment: Prior history of cocaine  . Sexual activity: Yes    Birth control/protection: None  Other Topics Concern  . Not on file  Social History Narrative  . Not on file   Social Determinants of Health   Financial Resource Strain: Not on file  Food Insecurity: Not on file  Transportation Needs: Not on file  Physical Activity: Not on file  Stress: Not on file  Social Connections: Not on file  Intimate Partner Violence: Not on file     Family History  Problem Relation Age of Onset  . Heart disease Father 27  . Ankylosing spondylitis Sister   . Stomach cancer Brother 32  . Heart attack Brother      Review of Systems: All other systems reviewed and are otherwise negative except as noted above.  Physical Exam: Vitals:   03/05/21 0824 03/05/21 0829 03/05/21 0833 03/05/21 0848  BP: (!) 138/58   125/69  Pulse: 65 62 62 60  Resp: 13 14 14 11   Temp:    97.6 F (36.4 C)  TempSrc:      SpO2: 100% 99% 99% 95%  Weight:      Height:        GEN- The patient is well appearing, alert and oriented x 3 today.   HEENT: normocephalic, atraumatic; sclera clear, conjunctiva pink; hearing intact; oropharynx clear; neck supple Lungs- Clear to ausculation bilaterally, normal work of breathing.  No wheezes, rales, rhonchi Heart- Regular rate and rhythm, no murmurs, rubs or gallops GI- soft, non-tender, non-distended, bowel sounds present Extremities- no clubbing, cyanosis, or edema; DP/PT/radial pulses 2+ bilaterally MS- no significant deformity or atrophy Skin- warm and dry, no rash or lesion Psych- euthymic mood, full affect Neuro- strength and sensation are  intact  Labs:   Lab Results  Component Value Date   WBC 8.0 03/05/2021   HGB 14.0 03/05/2021   HCT 42.1 03/05/2021   MCV 90.5 03/05/2021   PLT 205 03/05/2021    Recent Labs  Lab 03/05/21 0044  NA 133*  K 3.7  CL 98  CO2 24  BUN 25*  CREATININE 1.04*  CALCIUM 8.8*  GLUCOSE 110*      Radiology/Studies: DG Chest Port 1 View  Result Date: 03/01/2021 CLINICAL DATA:  Shortness of breath for 4-5 days. EXAM: PORTABLE CHEST 1 VIEW COMPARISON:  Chest radiograph 01/11/2021 FINDINGS: Stable cardiomediastinal contours with enlarged heart size. Aortic arch calcification. Left chest pacer appears unchanged in position. Stable scarring in the left mid lung. No new focal opacity. No pneumothorax or significant pleural effusion. No acute finding in the visualized skeleton. IMPRESSION: No acute cardiopulmonary finding. Electronically Signed   By: 01/13/2021 M.D.   On: 03/01/2021 21:52   CUP PACEART INCLINIC DEVICE CHECK  Result Date: 02/27/2021 ICD check in clinic. Normal device function. Thresholds and sensing consistent with previous device measurements. Impedance trends stable over time. No mode switches. No new ventricular arrhythmias, January event previously noted in hospital. Histogram distribution appropriate for patient and level of activity. No changes made this session. Device programmed at appropriate safety margins.  Estimated longevity 9.5 yrs. Pt enrolled in remote follow-up. Patient education completed including shock  plan.   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  Recurrent VT Had been quiescent since January with revascularization and amio. Presented with ICD shock x 4.  Continue amio re-load at 400 mg  BID Continue mexitil Maximize anti-ischemic therapies (imdur recently added) Will review VT with Dr. Lalla Brothers to see if patient is a reasonable candidate to consider VT ablation. (Dr. Ladona Ridgel is out of town all week).   2. Chronic systolic dysfunction s/p [employer] dual chamber ICD  NYHA III symptoms Low filling pressures on cath today.  On an appropriate medical regimen Normal ICD function  3. PVCs Has previously been described as a poor ablation candidate with multiple morphologies.  Continue amio    For questions or updates, please contact [facility] Please consult [email or web address] for contact info under Cardiology/STEMI.  Dustin Flock, PA-C  03/05/2021 8:54 AM

## 2021-03-05 NOTE — Interval H&P Note (Signed)
History and Physical Interval Note:  03/05/2021 7:47 AM  [name] [name]  has presented today for surgery, with the diagnosis of CAD.  The various methods of treatment have been discussed with the patient and family. After consideration of risks, benefits and other options for treatment, the patient has consented to  Procedure(s): RIGHT/LEFT HEART CATH AND CORONARY ANGIOGRAPHY (N/A) as a surgical intervention.  The patient's history has been reviewed, patient examined, no change in status, stable for surgery.  I have reviewed the patient's chart and labs.  Questions were answered to the patient's satisfaction.     Chastin Garlitz [employer]

## 2021-03-05 NOTE — Plan of Care (Signed)
  Problem: Education: Goal: Knowledge of General Education information will improve Description: Including pain rating scale, medication(s)/side effects and non-pharmacologic comfort measures Outcome: Progressing   Problem: Clinical Measurements: Goal: Will remain free from infection Outcome: Progressing Goal: Cardiovascular complication will be avoided Outcome: Progressing   Problem: Safety: Goal: Ability to remain free from injury will improve Outcome: Progressing   Problem: Skin Integrity: Goal: Risk for impaired skin integrity will decrease Outcome: Progressing

## 2021-03-05 NOTE — Progress Notes (Signed)
ANTICOAGULATION CONSULT NOTE   Pharmacy Consult for Heparin Indication: persistent AFib, anticardiolipin syndrome  Allergies  Allergen Reactions   Beef-Derived Products Anaphylaxis    From tick bite   Metoclopramide Hcl Anaphylaxis    Non responsive   Penicillins Anaphylaxis    Pt states she can take any cephalosporin (per pt. 01/01/19) Shock Has patient had a PCN reaction causing immediate rash, facial/tongue/throat swelling, SOB or lightheadedness with hypotension: Yes Has patient had a PCN reaction causing severe rash involving mucus membranes or skin necrosis: No Has patient had a PCN reaction that required hospitalization Yes Has patient had a PCN reaction occurring within the last 10 years: No If all of the above answers are "NO", then may proceed with Cephalosporin    Pork-Derived Products Anaphylaxis    From tick bite. Has tolerated heparin/lovenox   Metoprolol Other (See Comments)    Syncope    Reglan [Metoclopramide]    Statins Other (See Comments)    Myalgias    Patient Measurements: Height: 5\' 5"  (165.1 cm) Weight: 87.7 kg (193 lb 6.4 oz) IBW/kg (Calculated) : 57 Heparin dosing weight: 76 kg  Vital Signs: Temp: 97.6 F (36.4 C) (03/07 0848) Temp Source: Oral (03/07 0300) BP: 125/69 (03/07 0848) Pulse Rate: 60 (03/07 0848)  Labs: Recent Labs    03/03/21 0050 03/04/21 0206 03/05/21 0044 03/05/21 0328  HGB 13.0 14.2  --  14.0  HCT 38.8 43.8  --  42.1  PLT 206 223  --  205  LABPROT 33.8* 24.1* 18.7*  --   INR 3.5* 2.3* 1.6*  --   CREATININE 0.81 0.85 1.04*  --     Estimated Creatinine Clearance: 65.3 mL/min (A) (by C-G formula based on SCr of 1.04 mg/dL (H)).   Medical History: Past Medical History:  Diagnosis Date   Aneurysm of internal carotid artery    Anticardiolipin antibody positive    Chronic Coumadin   Brain aneurysm    followed by Dr. 05/05/21   Chronic combined systolic (congestive) and diastolic (congestive) heart  failure (HCC)    Cocaine abuse in remission [facility])    Coronary atherosclerosis of native coronary artery    Previous invasive cardiac testing was done at a facility in [place], [place].  Occluded diagonal, Diffuse LAD disease, Occluded Cx, and non obstructive RCA.    Essential hypertension    Headache    History of pneumonia    ICD (implantable cardioverter-defibrillator) in place    Ischemic cardiomyopathy    LVEF 30-35%   Mixed hyperlipidemia    NSVT (nonsustained ventricular tachycardia) (HCC)    Persistent atrial fibrillation (HCC)    PVD (peripheral vascular disease) (HCC)    Raynaud's phenomenon    Statin intolerance    Stroke [facility]) 2007   Total occlusion of the right internal carotid    Assessment: [name] with history of persistent Afib and anticardiolipin syndrome on warfarin PTA. Last dose taken 3/3 PM. INR 2.1 on admit. S/p [facility] today, pharmacy asked to start heparin 8 hrs after sheath removal.   INR down 1.6 today since holding warfarin since 3/4. CBC stable/wnl. Sheath removed ~0830 this AM.  Goal of Therapy:  Heparin level 0.3-0.7 units/ml  Monitor platelets by anticoagulation protocol: Yes   Plan:  Continue to hold warfarin Start IV heparin at 950 units/hr at 1630 Check 6-hr HL Daily HL, INR, CBC, s/sx bleeding  05-13-1969, PharmD, BCPS PGY2 Cardiology Pharmacy Resident Phone: [phone] 03/05/2021  9:05 AM  Please check [email or web address]  for unit-specific pharmacy phone numbers.

## 2021-03-05 NOTE — Progress Notes (Signed)
ANTICOAGULATION CONSULT NOTE   Pharmacy Consult for Heparin Indication: persistent AFib, anticardiolipin syndrome  Allergies  Allergen Reactions  . Beef-Derived Products Anaphylaxis    From tick bite  . Metoclopramide Hcl Anaphylaxis    Non responsive  . Penicillins Anaphylaxis    Pt states she can take any cephalosporin (per pt. 01/01/19) Shock Has patient had a PCN reaction causing immediate rash, facial/tongue/throat swelling, SOB or lightheadedness with hypotension: Yes Has patient had a PCN reaction causing severe rash involving mucus membranes or skin necrosis: No Has patient had a PCN reaction that required hospitalization Yes Has patient had a PCN reaction occurring within the last 10 years: No If all of the above answers are "NO", then may proceed with Cephalosporin   . Pork-Derived Products Anaphylaxis    From tick bite. Has tolerated heparin/lovenox  . Metoprolol Other (See Comments)    Syncope   . Reglan [Metoclopramide]   . Statins Other (See Comments)    Myalgias    Patient Measurements: Height: 5\' 5"  (165.1 cm) Weight: 87.7 kg (193 lb 6.4 oz) IBW/kg (Calculated) : 57 Heparin dosing weight: 76 kg  Vital Signs: Temp: 97.9 F (36.6 C) (03/07 1940) Temp Source: Oral (03/07 1940) BP: 111/54 (03/07 1940) Pulse Rate: 70 (03/07 2106)  Labs: Recent Labs    03/03/21 0050 03/04/21 0206 03/05/21 0044 03/05/21 0328 03/05/21 0801 03/05/21 2145  HGB 13.0 14.2  --  14.0 13.9  13.9  --   HCT 38.8 43.8  --  42.1 41.0  41.0  --   PLT 206 223  --  205  --   --   LABPROT 33.8* 24.1* 18.7*  --   --   --   INR 3.5* 2.3* 1.6*  --   --   --   HEPARINUNFRC  --   --   --   --   --  0.11*  CREATININE 0.81 0.85 1.04*  --   --   --     Estimated Creatinine Clearance: 65.3 mL/min (A) (by C-G formula based on SCr of 1.04 mg/dL (H)).   Medical History: Past Medical History:  Diagnosis Date  . Aneurysm of internal carotid artery   . Anticardiolipin antibody positive     Chronic Coumadin  . Brain aneurysm    followed by Dr. 2146  . Chronic combined systolic (congestive) and diastolic (congestive) heart failure (HCC)   . Cocaine abuse in remission (HCC)   . Coronary atherosclerosis of native coronary artery    Previous invasive cardiac testing was done at a facility in [place], [place].  Occluded diagonal, Diffuse LAD disease, Occluded Cx, and non obstructive RCA.   . Essential hypertension   . Headache   . History of pneumonia   . ICD (implantable cardioverter-defibrillator) in place   . Ischemic cardiomyopathy    LVEF 30-35%  . Mixed hyperlipidemia   . NSVT (nonsustained ventricular tachycardia) (HCC)   . Persistent atrial fibrillation (HCC)   . PVD (peripheral vascular disease) (HCC)   . Raynaud's phenomenon   . Statin intolerance   . Stroke [facility]) 2007   Total occlusion of the right internal carotid    Assessment: [name] with history of persistent Afib and anticardiolipin syndrome on warfarin PTA. Last dose taken 3/3 PM. INR 2.1 on admit. S/p [facility] today, pharmacy asked to start heparin 8 hrs after sheath removal.   INR down 1.6 today since holding warfarin since 3/4. CBC stable/wnl. Heparin level tonight came back subtherapeutic at  0.11, on 950 units/hr. No s/sx of bleeding or infusion issues per nursing.   Goal of Therapy:  Heparin level 0.3-0.7 units/ml  Monitor platelets by anticoagulation protocol: Yes   Plan:  Continue to hold warfarin Increase IV heparin to 1150 units/hr Check 6-hr HL Daily HL, INR, CBC, s/sx bleeding  Sherron Monday, PharmD, BCCCP Clinical Pharmacist  Phone: [phone] 03/05/2021 10:37 PM  Please check AMION for all [facility] Pharmacy phone numbers After 10:00 PM, call Main Pharmacy [phone]

## 2021-03-05 NOTE — Progress Notes (Signed)
Carotid Completed    Please see CV Proc for preliminary results.   Shanina Kepple, RVT  

## 2021-03-05 NOTE — Progress Notes (Signed)
Patient ID: [name] [name], female   DOB: Mar 30, 1961, 60 y.o.   MRN: [id number]     Advanced Heart Failure Rounding Note  PCP-Cardiologist: Nona Dell, MD   Subjective:    Admitted  for ICD shocks x 4 for VT. Has had recent nitrate responsive CP.  Hs Trop 2,161>>3,070. EKG no acute ST changes.   On Coumadin for anti-phospholipid antibody syndrome and afib, now held with INR 1.6 today.   No further VT on telemetry.  No complaints, walked in room yesterday.   LHC/RHC: Coronary Findings   Diagnostic Dominance: Right  Left Main  No significant coronary disease.  Left Anterior Descending  Luminal irregularities in the LAD. Moderate D1 is chronically occluded in the mid-vessel (seen on prior cath).  Ramus Intermedius  Moderate vessel, no significant disease.  Left Circumflex  Patent stent in the proximal LCx, mild luminal irregularities.  Right Coronary Artery  No significant coronary disease.   Intervention   No interventions have been documented.  Right Heart  Right Heart Pressures RHC Procedural Findings: Hemodynamics (mmHg) RA 1 RV 32/1 PA 19/4, mean 12 PCWP mean 7 LV 121/6 AO 124/57  Oxygen saturations: PA 73% AO 99%  Cardiac Output (Fick) 5.22  Cardiac Index (Fick) 2.69     Objective:   Weight Range: 87.7 kg Body mass index is 32.18 kg/m.   Vital Signs:   Temp:  [97.5 F (36.4 C)-97.7 F (36.5 C)] 97.6 F (36.4 C) (03/07 0848) Pulse Rate:  [56-77] 60 (03/07 0848) Resp:  [11-19] 11 (03/07 0848) BP: (105-148)/(55-80) 125/69 (03/07 0848) SpO2:  [94 %-100 %] 95 % (03/07 0848) Weight:  [87.7 kg] 87.7 kg (03/07 0418) Last BM Date: 03/03/21  Weight change: Filed Weights   03/01/21 2051 03/02/21 1241 03/05/21 0418  Weight: 86.2 kg 87.2 kg 87.7 kg    Intake/Output:   Intake/Output Summary (Last 24 hours) at 03/05/2021 0857 Last data filed at 03/05/2021 0300 Gross per 24 hour  Intake -  Output 3250 ml  Net -3250 ml      Physical Exam     General: NAD Neck: No JVD, no thyromegaly or thyroid nodule.  Lungs: Clear to auscultation bilaterally with normal respiratory effort. CV: Nondisplaced PMI.  Heart regular S1/S2, no S3/S4, no murmur.  No peripheral edema.   Abdomen: Soft, nontender, no hepatosplenomegaly, no distention.  Skin: Intact without lesions or rashes.  Neurologic: Alert and oriented x 3.  Psych: Normal affect. Extremities: No clubbing or cyanosis.  HEENT: Normal.    Telemetry   NSR, no VT.  Personally reviewed.    Labs    CBC Recent Labs    03/04/21 0206 03/05/21 0328  WBC 9.0 8.0  HGB 14.2 14.0  HCT 43.8 42.1  MCV 91.6 90.5  PLT 223 205   Basic Metabolic Panel Recent Labs    81/82/99 0206 03/05/21 0044  NA 137 133*  K 3.5 3.7  CL 97* 98  CO2 27 24  GLUCOSE 91 110*  BUN 13 25*  CREATININE 0.85 1.04*  CALCIUM 9.2 8.8*  MG 2.3 2.1   Liver Function Tests No results for input(s): AST, ALT, ALKPHOS, BILITOT, PROT, ALBUMIN in the last 72 hours. No results for input(s): LIPASE, AMYLASE in the last 72 hours. Cardiac Enzymes No results for input(s): CKTOTAL, CKMB, CKMBINDEX, TROPONINI in the last 72 hours.  BNP: BNP (last 3 results) Recent Labs    12/14/20 1921 02/27/21 1605 03/01/21 2125  BNP 209.0* 320.5* 194.0*    ProBNP (  last 3 results) No results for input(s): PROBNP in the last 8760 hours.   D-Dimer No results for input(s): DDIMER in the last 72 hours. Hemoglobin A1C No results for input(s): HGBA1C in the last 72 hours. Fasting Lipid Panel No results for input(s): CHOL, HDL, LDLCALC, TRIG, CHOLHDL, LDLDIRECT in the last 72 hours. Thyroid Function Tests No results for input(s): TSH, T4TOTAL, T3FREE, THYROIDAB in the last 72 hours.  Invalid input(s): FREET3  Other results:   Imaging    CARDIAC CATHETERIZATION  Result Date: 03/05/2021 1. Low filling pressures. 2. Preserved cardiac output. 3. Stable coronaries, patent LCx stent.  Known chronic occlusion of D1.      Medications:     Scheduled Medications: . amiodarone  400 mg Oral BID  . carvedilol  6.25 mg Oral BID WC  . clopidogrel  75 mg Oral Q breakfast  . dapagliflozin propanediol  10 mg Oral Daily  . digoxin  0.125 mg Oral Daily  . ezetimibe  10 mg Oral Daily  . isosorbide mononitrate  30 mg Oral Daily  . melatonin  3 mg Oral QHS  . mexiletine  150 mg Oral Q8H  . sacubitril-valsartan  1 tablet Oral BID  . sodium chloride flush  3 mL Intravenous Q12H  . sodium chloride flush  3 mL Intravenous Q12H  . spironolactone  25 mg Oral Daily  . torsemide  80 mg Oral Daily    Infusions: . sodium chloride    . sodium chloride      PRN Medications: sodium chloride, acetaminophen, acetaminophen, hydrALAZINE, labetalol, nitroGLYCERIN, ondansetron (ZOFRAN) IV, oxyCODONE-acetaminophen **AND** oxyCODONE, prochlorperazine, sodium chloride flush, traZODone   Assessment/Plan   1. VT: Suspect scar-mediated VT, no new coronary disease on angiography this admission.  Prior VT in 1/22.  Had PCI to LCx in 1/22. HS-TnI up to 3070 but appeasr to be demand ischemia with VT and shocks x 4 rather than true ACS as no new disease on coronary angiography.  - Reload amiodarone 400 mg bid.  - Continue mexiletine.   - EP evaluation, ?VT ablation given no new coronary disease and ongoing episodes of VT on 2 anti-arrhythmic meds and Coreg.  2. CAD: MI in 2008 in [facility], cath at the time showed totally occluded diagonal, totally occluded LCX, and diffuse LAD disease.  No intervention. LHC was done in 12/21 due to VT, this showed chronically occluded D1 and 95% proximal LCx.  Initially, no PCI due to thrombocytopenia.  She eventually had PCI to proximal LCx in 1/22.  Elevated troponin appears to be demand ischemic as no new CAD noted on coronary angiography this admission.  - Continue Zetia, has not tolerated statins.  Good lipids in 12/21.   - Continue Plavix 75 mg daily x at least 6 months post PCI and ideally 1  year. - She is off ASA given use of warfarin and Plavix.  3. Atrial fibrillation: TEE-guided DCCV to NSR in 12/21.  She is in NSR. No breakthrough Afib on device interrogation.  - continue amiodarone  - If atrial fibrillation recurs, will need to consider Tikosyn versus ablation.  - Coumadin on hold for procedures, will start IV heparin gtt 8 hrs post-cath. If no plan for ablation, will need to restart warfarin.  4. Carotid stenosis: Known RICA occlusion. She is supposed to get repeat carotid dopplers, will order.  4. 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  - She is on chronic coumadin, now on hold for procedures.  Restart heparin gtt after cath (  8 hrs), then restart warfarin when no further procedures planned.  6. Chronic systolic CHF: Ischemic cardiomyopathy.  [employer] ICD.  Echo (8/21) with EF 15-20%, anteroseptal AK, RV normal, severe LAE, mild MR.  TEE (12/21) with EF 25-30%.  RHC in 12/21 with preserved cardiac output and optimized filling pressures.  NYHA class II symptoms, not volume overloaded on exam.  RHC today showed preserved cardiac output and low filling pressures.  - Continue Entresto 24-26 bid (BP too soft for titration)  - Continue Farxiga 10 mg daily.   - Continue spironolactone 25 mg daily.  - Continue Coreg 6.25 mg bid.  - can decreased torsemide to 60 mg daily with low filling pressures.  - Continue digoxin, level ok.  7. Smoking: Active smoker, wants to stop on her own.  8. PAD: She denies claudication.  - Needs to quit smoking.  9. 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  Not low this admission.  - She will see Dr. Truett Perna (hematology).  10. Mitral regurgitation: Moderate on 12/21 TEE, suspect functional.   Length of Stay: 3  [name], MD  03/05/2021, 8:57 AM  Advanced Heart Failure Team Pager [phone] (M-F; 7a - 4p)  Please contact [facility] for night-coverage after hours (4p -7a ) and weekends on [email or web address]

## 2021-03-06 DIAGNOSIS — I5042 Chronic combined systolic (congestive) and diastolic (congestive) heart failure: Secondary | ICD-10-CM | POA: Diagnosis not present

## 2021-03-06 LAB — BASIC METABOLIC PANEL
Anion gap: 8 (ref 5–15)
BUN: 18 mg/dL (ref 6–20)
CO2: 29 mmol/L (ref 22–32)
Calcium: 8.8 mg/dL — ABNORMAL LOW (ref 8.9–10.3)
Chloride: 98 mmol/L (ref 98–111)
Creatinine, Ser: 0.92 mg/dL (ref 0.44–1.00)
GFR, Estimated: >60 mL/min (ref >60)
Glucose, Bld: 100 mg/dL — ABNORMAL HIGH (ref 70–99)
Potassium: 4.6 mmol/L (ref 3.5–5.1)
Sodium: 135 mmol/L (ref 135–145)

## 2021-03-06 LAB — HEPARIN LEVEL (UNFRACTIONATED)
Heparin Unfractionated: 0.24 IU/mL — ABNORMAL LOW (ref 0.30–0.70)
Heparin Unfractionated: 0.39 IU/mL (ref 0.30–0.70)
Heparin Unfractionated: 0.43 IU/mL (ref 0.30–0.70)

## 2021-03-06 LAB — CBC
HCT: 40.9 % (ref 36.0–46.0)
Hemoglobin: 13.2 g/dL (ref 12.0–15.0)
MCH: 29.5 pg (ref 26.0–34.0)
MCHC: 32.3 g/dL (ref 30.0–36.0)
MCV: 91.5 fL (ref 80.0–100.0)
Platelets: 173 10*3/uL (ref 150–400)
RBC: 4.47 MIL/uL (ref 3.87–5.11)
RDW: 12.9 % (ref 11.5–15.5)
WBC: 7.5 10*3/uL (ref 4.0–10.5)
nRBC: 0 % (ref 0.0–0.2)

## 2021-03-06 LAB — PROTIME-INR
INR: 1.2 (ref 0.8–1.2)
Prothrombin Time: 15 seconds (ref 11.4–15.2)

## 2021-03-06 NOTE — Progress Notes (Signed)
Electrophysiology Rounding Note  Patient Name: [name] [name] Date of Encounter: 03/06/2021  Primary Cardiologist: Nona Dell, MD Electrophysiologist: Lewayne Bunting, MD   Subjective   No new complaints this am.   "I'm nervous about going in and out of VT so many times for the procedure (ablation)"  Inpatient Medications    Scheduled Meds:  amiodarone  400 mg Oral BID   carvedilol  6.25 mg Oral BID WC   clopidogrel  75 mg Oral Q breakfast   dapagliflozin propanediol  10 mg Oral Daily   digoxin  0.125 mg Oral Daily   ezetimibe  10 mg Oral Daily   isosorbide mononitrate  30 mg Oral Daily   melatonin  3 mg Oral QHS   mexiletine  150 mg Oral Q8H   ranolazine  500 mg Oral BID   sacubitril-valsartan  1 tablet Oral BID   sodium chloride flush  3 mL Intravenous Q12H   sodium chloride flush  3 mL Intravenous Q12H   spironolactone  25 mg Oral Daily   torsemide  60 mg Oral Daily   Continuous Infusions:  sodium chloride     heparin 1,300 Units/hr (03/06/21 0610)   PRN Meds: sodium chloride, acetaminophen, magnesium hydroxide, nitroGLYCERIN, ondansetron (ZOFRAN) IV, oxyCODONE-acetaminophen **AND** oxyCODONE, prochlorperazine, sodium chloride flush, traZODone   Vital Signs    Vitals:   03/05/21 2300 03/06/21 0300 03/06/21 0448 03/06/21 0719  BP: (!) 147/67 (!) 87/42    Pulse: 73 63  64  Resp: 18 12 19    Temp: 97.9 F (36.6 C) 97.6 F (36.4 C)  (!) 97.1 F (36.2 C)  TempSrc: Oral Oral  Oral  SpO2: 94% 96%  96%  Weight:      Height:        Intake/Output Summary (Last 24 hours) at 03/06/2021 0739 Last data filed at 03/06/2021 0300 Gross per 24 hour  Intake 108.28 ml  Output 1800 ml  Net -1691.72 ml   Filed Weights   03/01/21 2051 03/02/21 1241 03/05/21 0418  Weight: 86.2 kg 87.2 kg 87.7 kg    Physical Exam    GEN- The patient is well appearing, alert and oriented x 3 today.   Head- normocephalic, atraumatic Eyes-  Sclera clear, conjunctiva  pink Ears- hearing intact Oropharynx- clear Neck- supple Lungs- Clear to ausculation bilaterally, normal work of breathing Heart- Regular rate and rhythm, no murmurs, rubs or gallops GI- soft, NT, ND, + BS Extremities- no clubbing or cyanosis. No edema Skin- no rash or lesion Psych- euthymic mood, full affect Neuro- strength and sensation are intact  Labs    CBC Recent Labs    03/05/21 0328 03/05/21 0801 03/06/21 0530  WBC 8.0  --  7.5  HGB 14.0 13.9   13.9 13.2  HCT 42.1 41.0   41.0 40.9  MCV 90.5  --  91.5  PLT 205  --  173   Basic Metabolic Panel Recent Labs    05/06/21 0206 03/05/21 0044 03/05/21 0801 03/06/21 0530  NA 137 133* 139   139 135  K 3.5 3.7 4.1   3.9 4.6  CL 97* 98  --  98  CO2 27 24  --  29  GLUCOSE 91 110*  --  100*  BUN 13 25*  --  18  CREATININE 0.85 1.04*  --  0.92  CALCIUM 9.2 8.8*  --  8.8*  MG 2.3 2.1  --   --    Liver Function Tests No results for input(s): AST,  ALT, ALKPHOS, BILITOT, PROT, ALBUMIN in the last 72 hours. No results for input(s): LIPASE, AMYLASE in the last 72 hours. Cardiac Enzymes No results for input(s): CKTOTAL, CKMB, CKMBINDEX, TROPONINI in the last 72 hours.   Telemetry    NSR 60-70s (personally reviewed)  Radiology    CARDIAC CATHETERIZATION  Result Date: 03/05/2021 1. Low filling pressures. 2. Preserved cardiac output. 3. Stable coronaries, patent LCx stent.  Known chronic occlusion of D1.   VAS US CAROTID  Result Date: 03/05/2021 Carotid Arterial Duplex Study Indications:       CVA and Carotid artery disease. Risk Factors:      Hyperlipidemia, current smoker, coronary artery disease,                    prior CVA, PAD. Comparison Study:  3/17 Right Occluded Left 1-39% Performing Technologist: Clint Guy RVT  Examination Guidelines: A complete evaluation includes B-mode imaging, spectral Doppler, color Doppler, and power Doppler as needed of all accessible portions of each vessel. Bilateral testing is  considered an integral part of a complete examination. Limited examinations for reoccurring indications may be performed as noted.  Right Carotid Findings: +----------+--------+--------+--------+------------------+--------+             PSV cm/s EDV cm/s Stenosis Plaque Description Comments  +----------+--------+--------+--------+------------------+--------+  CCA Prox   89                                                      +----------+--------+--------+--------+------------------+--------+  CCA Distal 49                                                      +----------+--------+--------+--------+------------------+--------+  ICA Prox                     Occluded                              +----------+--------+--------+--------+------------------+--------+  ICA Mid                      Occluded                              +----------+--------+--------+--------+------------------+--------+  ICA Distal                   Occluded                              +----------+--------+--------+--------+------------------+--------+  ECA        191                                                     +----------+--------+--------+--------+------------------+--------+ +----------+--------+-------+--------+-------------------+             PSV cm/s EDV cms Describe Arm Pressure (mmHG)  +----------+--------+-------+--------+-------------------+  Subclavian 223                                            +----------+--------+-------+--------+-------------------+ +---------+--------+--+--------+--+  Vertebral PSV cm/s 38 EDV cm/s 14  +---------+--------+--+--------+--+  Left Carotid Findings: +----------+--------+--------+--------+------------------+--------+             PSV cm/s EDV cm/s Stenosis Plaque Description Comments  +----------+--------+--------+--------+------------------+--------+  CCA Prox   130      27                                             +----------+--------+--------+--------+------------------+--------+  CCA  Distal 87       21                                             +----------+--------+--------+--------+------------------+--------+  ICA Prox   93       27       1-39%    heterogenous                 +----------+--------+--------+--------+------------------+--------+  ICA Distal 75       26                                             +----------+--------+--------+--------+------------------+--------+  ECA        117                                                     +----------+--------+--------+--------+------------------+--------+ +----------+--------+--------+--------+-------------------+             PSV cm/s EDV cm/s Describe Arm Pressure (mmHG)  +----------+--------+--------+--------+-------------------+  Subclavian 103                                             +----------+--------+--------+--------+-------------------+ +---------+--------+--+--------+--+  Vertebral PSV cm/s 33 EDV cm/s 11  +---------+--------+--+--------+--+   Summary: Right Carotid: Evidence consistent with a total occlusion of the right ICA. Left Carotid: Velocities in the left ICA are consistent with a 1-39% stenosis. Vertebrals:  Bilateral vertebral arteries demonstrate antegrade flow. Subclavians: Normal flow hemodynamics were seen in bilateral subclavian              arteries. *See table(s) above for measurements and observations.  Electronically signed by Fabienne Bruns MD on 03/05/2021 at 9:13:32 PM.    Final     Patient Profile     [name] [name] is a 60 y.o. female with a history of of CAD (PCI of LCx in Jan 2022), ICM (s/pBoston Scientific dual chamber ICD implant 2017), cocaine abuse (in remission), CVA, AFIB, antiphospholipid syndrome (on Coumadin), who presented to the hospital for worsening SOB and 4 shocks from her ICD who is being seen today for the evaluation of recurrent VT at the request of Dr. Shirlee Latch.  Assessment & Plan    1. Recurrent VT Had been quiescent since January with revascularization and amio.  Presented with ICD shock x 4.  Continue amio 400 mg BID Continue mexitil Maximize anti-ischemic therapies (imdur recently added) Discussed AADs with Dr. Lalla Brothers. Continue. Will  perform scar modification/ablation even if VT cannot be induced in lab.   2. Chronic systolic dysfunction s/p Boston Scientificdual chamber ICD NYHA III symptoms Low filling pressures on cath 03/05/21 On an appropriate medical regimen Normal ICD function She is eating multiple high sodium meals ([employer] for lunch, [employer] for dinner last night) Has been counseled on salt and fluid restrictions.   3. PVCs Has previously been described as a poor ablation candidate with multiple morphologies.  Continue amio as above.   4. Anti-phospholipid syndrome Continue heparin until 0000 03/08/2021.  Would plan on starting coumadin evening of 03/08/21 post ablation.   She is on the board for VT ablation 03/08/2021 with Dr. Lalla Brothers.     For questions or updates, please contact [facility] Please consult [email or web address] for contact info under Cardiology/STEMI.  Signed, Graciella Freer, PA-C  03/06/2021, 7:39 AM

## 2021-03-06 NOTE — Progress Notes (Addendum)
Patient ID: [name] [name], female   DOB: 25-Jul-1961, 60 y.o.   MRN: [id number]     Advanced Heart Failure Rounding Note  PCP-Cardiologist: Nona Dell, MD   Subjective:    Admitted  for ICD shocks x 4 for VT. 3/7 had RHC/LHC with stable coronaries, low filling pressures, and preserved cardiac output. Post cath torsemide cut back.   Feels ok. Denies SOB.    Objective:   Weight Range: 87.7 kg Body mass index is 32.18 kg/m.   Vital Signs:   Temp:  [97.1 F (36.2 C)-97.9 F (36.6 C)] 97.1 F (36.2 C) (03/08 0719) Pulse Rate:  [50-77] 64 (03/08 0719) Resp:  [10-19] 13 (03/08 0719) BP: (87-147)/(42-100) 94/46 (03/08 0719) SpO2:  [92 %-100 %] 96 % (03/08 0719) Last BM Date: 03/03/21  Weight change: Filed Weights   03/01/21 2051 03/02/21 1241 03/05/21 0418  Weight: 86.2 kg 87.2 kg 87.7 kg    Intake/Output:   Intake/Output Summary (Last 24 hours) at 03/06/2021 0802 Last data filed at 03/06/2021 0300 Gross per 24 hour  Intake 108.28 ml  Output 1800 ml  Net -1691.72 ml      Physical Exam   General:  Well appearing. No resp difficulty HEENT: normal Neck: supple. no JVD. Carotids 2+ bilat; no bruits. No lymphadenopathy or thryomegaly appreciated. Cor: PMI nondisplaced. Regular rate & rhythm. No rubs, gallops or murmurs. Lungs: clear Abdomen: soft, nontender, nondistended. No hepatosplenomegaly. No bruits or masses. Good bowel sounds. Extremities: no cyanosis, clubbing, rash, edema Neuro: alert & orientedx3, cranial nerves grossly intact. moves all 4 extremities w/o difficulty. Affect pleasant   Telemetry   NSR no VT over night.   Labs    CBC Recent Labs    03/05/21 0328 03/05/21 0801 03/06/21 0530  WBC 8.0  --  7.5  HGB 14.0 13.9  13.9 13.2  HCT 42.1 41.0  41.0 40.9  MCV 90.5  --  91.5  PLT 205  --  173   Basic Metabolic Panel Recent Labs    40/98/11 0206 03/05/21 0044 03/05/21 0801 03/06/21 0530  NA 137 133* 139  139 135  K 3.5 3.7 4.1  3.9  4.6  CL 97* 98  --  98  CO2 27 24  --  29  GLUCOSE 91 110*  --  100*  BUN 13 25*  --  18  CREATININE 0.85 1.04*  --  0.92  CALCIUM 9.2 8.8*  --  8.8*  MG 2.3 2.1  --   --    Liver Function Tests No results for input(s): AST, ALT, ALKPHOS, BILITOT, PROT, ALBUMIN in the last 72 hours. No results for input(s): LIPASE, AMYLASE in the last 72 hours. Cardiac Enzymes No results for input(s): CKTOTAL, CKMB, CKMBINDEX, TROPONINI in the last 72 hours.  BNP: BNP (last 3 results) Recent Labs    12/14/20 1921 02/27/21 1605 03/01/21 2125  BNP 209.0* 320.5* 194.0*    ProBNP (last 3 results) No results for input(s): PROBNP in the last 8760 hours.   D-Dimer No results for input(s): DDIMER in the last 72 hours. Hemoglobin A1C No results for input(s): HGBA1C in the last 72 hours. Fasting Lipid Panel No results for input(s): CHOL, HDL, LDLCALC, TRIG, CHOLHDL, LDLDIRECT in the last 72 hours. Thyroid Function Tests No results for input(s): TSH, T4TOTAL, T3FREE, THYROIDAB in the last 72 hours.  Invalid input(s): FREET3  Other results:   Imaging    VAS US CAROTID  Result Date: 03/05/2021 Carotid Arterial Duplex Study Indications:  CVA and Carotid artery disease. Risk Factors:      Hyperlipidemia, current smoker, coronary artery disease,                    prior CVA, PAD. Comparison Study:  3/17 Right Occluded Left 1-39% Performing Technologist: Clint Guy RVT  Examination Guidelines: A complete evaluation includes B-mode imaging, spectral Doppler, color Doppler, and power Doppler as needed of all accessible portions of each vessel. Bilateral testing is considered an integral part of a complete examination. Limited examinations for reoccurring indications may be performed as noted.  Right Carotid Findings: +----------+--------+--------+--------+------------------+--------+           PSV cm/sEDV cm/sStenosisPlaque DescriptionComments  +----------+--------+--------+--------+------------------+--------+ CCA Prox  89                                                 +----------+--------+--------+--------+------------------+--------+ CCA Distal49                                                 +----------+--------+--------+--------+------------------+--------+ ICA Prox                  Occluded                           +----------+--------+--------+--------+------------------+--------+ ICA Mid                   Occluded                           +----------+--------+--------+--------+------------------+--------+ ICA Distal                Occluded                           +----------+--------+--------+--------+------------------+--------+ ECA       191                                                +----------+--------+--------+--------+------------------+--------+ +----------+--------+-------+--------+-------------------+           PSV cm/sEDV cmsDescribeArm Pressure (mmHG) +----------+--------+-------+--------+-------------------+ [id number]                                        +----------+--------+-------+--------+-------------------+ +---------+--------+--+--------+--+ VertebralPSV cm/s38EDV cm/s14 +---------+--------+--+--------+--+  Left Carotid Findings: +----------+--------+--------+--------+------------------+--------+           PSV cm/sEDV cm/sStenosisPlaque DescriptionComments +----------+--------+--------+--------+------------------+--------+ CCA Prox  130     27                                         +----------+--------+--------+--------+------------------+--------+ CCA Distal87      21                                         +----------+--------+--------+--------+------------------+--------+  ICA Prox  93      27      1-39%   heterogenous               +----------+--------+--------+--------+------------------+--------+ ICA Distal75      26                                          +----------+--------+--------+--------+------------------+--------+ ECA       117                                                +----------+--------+--------+--------+------------------+--------+ +----------+--------+--------+--------+-------------------+           PSV cm/sEDV cm/sDescribeArm Pressure (mmHG) +----------+--------+--------+--------+-------------------+ [id number]                                         +----------+--------+--------+--------+-------------------+ +---------+--------+--+--------+--+ VertebralPSV cm/s33EDV cm/s11 +---------+--------+--+--------+--+   Summary: Right Carotid: Evidence consistent with a total occlusion of the right ICA. Left Carotid: Velocities in the left ICA are consistent with a 1-39% stenosis. Vertebrals:  Bilateral vertebral arteries demonstrate antegrade flow. Subclavians: Normal flow hemodynamics were seen in bilateral subclavian              arteries. *See table(s) above for measurements and observations.  Electronically signed by Fabienne Bruns MD on 03/05/2021 at 9:13:32 PM.    Final      Medications:     Scheduled Medications: . amiodarone  400 mg Oral BID  . carvedilol  6.25 mg Oral BID WC  . clopidogrel  75 mg Oral Q breakfast  . dapagliflozin propanediol  10 mg Oral Daily  . digoxin  0.125 mg Oral Daily  . ezetimibe  10 mg Oral Daily  . isosorbide mononitrate  30 mg Oral Daily  . melatonin  3 mg Oral QHS  . mexiletine  150 mg Oral Q8H  . ranolazine  500 mg Oral BID  . sacubitril-valsartan  1 tablet Oral BID  . sodium chloride flush  3 mL Intravenous Q12H  . sodium chloride flush  3 mL Intravenous Q12H  . spironolactone  25 mg Oral Daily  . torsemide  60 mg Oral Daily    Infusions: . sodium chloride    . heparin 1,300 Units/hr (03/06/21 0610)    PRN Medications: sodium chloride, acetaminophen, magnesium hydroxide, nitroGLYCERIN, ondansetron (ZOFRAN) IV,  oxyCODONE-acetaminophen **AND** oxyCODONE, prochlorperazine, sodium chloride flush, traZODone   Assessment/Plan   1. VT: Suspect scar-mediated VT, no new coronary disease on angiography this admission.  Prior VT in 1/22.  Had PCI to LCx in 1/22. HS-TnI up to 3070 but appears to be demand ischemia with VT and shocks x 4 rather than true ACS as no new disease on coronary angiography. Shocks by ICD were ineffective.  - Continue  amiodarone 400 mg bid.  - Continue mexiletine.   - EP evaluation, plan for VT ablation on Thursday. After this, will need ICD system revision given ineffective shocks.  2. CAD: MI in 2008 in [facility], cath at the time showed totally occluded diagonal, totally occluded LCX, and diffuse LAD disease.  No intervention. LHC was done in 12/21 due to VT, this showed chronically occluded D1 and 95%  proximal LCx.  Initially, no PCI due to thrombocytopenia.  She eventually had PCI to proximal LCx in 1/22.  Elevated troponin appears to be demand ischemic as no new CAD noted on coronary angiography this admission.  - No chest pain.  - Continue Zetia, has not tolerated statins.  Good lipids in 12/21.   - Continue Plavix 75 mg daily x at least 6 months post PCI and ideally 1 year. - She is off ASA given use of warfarin and Plavix.  3. Atrial fibrillation: TEE-guided DCCV to NSR in 12/21.  She is in NSR. No breakthrough Afib on device interrogation.  - continue amiodarone  - If atrial fibrillation recurs, will need to consider Tikosyn versus ablation.  - Coumadin on hold for procedures, continue IV heparin gtt. .  4. Carotid stenosis: Known RICA occlusion. She is supposed to get repeat carotid dopplers, will order.  4. 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  - She is on chronic coumadin, now on hold for procedures.  Continue heparin gtt.  6. Chronic systolic CHF: Ischemic cardiomyopathy.  [employer] ICD.  Echo (8/21) with EF 15-20%, anteroseptal AK, RV normal, severe  LAE, mild MR.  TEE (12/21) with EF 25-30%.  RHC in 12/21 with preserved cardiac output and optimized filling pressures.  NYHA class II symptoms, not volume overloaded on exam.  RHC showed preserved cardiac output and low filling pressures.  - Volume status stable. Check BMET  - Continue Entresto 24-26 bid (BP too soft for titration)  - Continue Farxiga 10 mg daily.   - Continue spironolactone 25 mg daily.  - Continue Coreg 6.25 mg bid.  - Continue torsemide to 60 mg daily  - Continue digoxin, level ok.  7. Smoking: Active smoker, wants to stop on her own.  8. PAD: She denies claudication.  - Needs to quit smoking.  9. 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  Not low this admission.  - She will see Dr. Truett Perna (hematology).  10. Mitral regurgitation: Moderate on 12/21 TEE, suspect functional.   Length of Stay: 4  Amy Clegg, NP  03/06/2021, 8:02 AM  Advanced Heart Failure Team Pager [phone] (M-F; 7a - 5p)  Please contact [facility] for night-coverage after hours (4p -7a ) and weekends on [email or web address]  Patient seen with NP, agree with the above note.   Stable today.  Cath yesterday with normal filling pressures and no change in coronaries.    No further VT.  Plan for VT ablation Thursday, then will need ICD system revision as ICD shocks did not terminate her VT.    [name] 03/06/2021 8:33 AM

## 2021-03-06 NOTE — Progress Notes (Signed)
ANTICOAGULATION CONSULT NOTE   Pharmacy Consult for Heparin Indication: persistent AFib, anticardiolipin syndrome  Allergies  Allergen Reactions  . Beef-Derived Products Anaphylaxis    From tick bite  . Metoclopramide Hcl Anaphylaxis    Non responsive  . Penicillins Anaphylaxis    Pt states she can take any cephalosporin (per pt. 01/01/19) Shock Has patient had a PCN reaction causing immediate rash, facial/tongue/throat swelling, SOB or lightheadedness with hypotension: Yes Has patient had a PCN reaction causing severe rash involving mucus membranes or skin necrosis: No Has patient had a PCN reaction that required hospitalization Yes Has patient had a PCN reaction occurring within the last 10 years: No If all of the above answers are "NO", then may proceed with Cephalosporin   . Pork-Derived Products Anaphylaxis    From tick bite. Has tolerated heparin/lovenox  . Metoprolol Other (See Comments)    Syncope   . Reglan [Metoclopramide]   . Statins Other (See Comments)    Myalgias    Patient Measurements: Height: 5\' 5"  (165.1 cm) Weight: 87.7 kg (193 lb 6.4 oz) IBW/kg (Calculated) : 57 Heparin dosing weight: 76 kg  Vital Signs: Temp: 97.7 F (36.5 C) (03/08 1132) Temp Source: Oral (03/08 1132) BP: 90/49 (03/08 1147) Pulse Rate: 60 (03/08 1132)  Labs: Recent Labs    03/04/21 0206 03/05/21 0044 03/05/21 0328 03/05/21 0801 03/05/21 2145 03/06/21 0530 03/06/21 1221  HGB 14.2  --  14.0 13.9  13.9  --  13.2  --   HCT 43.8  --  42.1 41.0  41.0  --  40.9  --   PLT 223  --  205  --   --  173  --   LABPROT 24.1* 18.7*  --   --   --  15.0  --   INR 2.3* 1.6*  --   --   --  1.2  --   HEPARINUNFRC  --   --   --   --  0.11* 0.24* 0.39  CREATININE 0.85 1.04*  --   --   --  0.92  --     Estimated Creatinine Clearance: 73.8 mL/min (by C-G formula based on SCr of 0.92 mg/dL).   Medical History: Past Medical History:  Diagnosis Date  . Aneurysm of internal carotid artery    . Anticardiolipin antibody positive    Chronic Coumadin  . Brain aneurysm    followed by Dr. 05/06/21  . Chronic combined systolic (congestive) and diastolic (congestive) heart failure (HCC)   . Cocaine abuse in remission (HCC)   . Coronary atherosclerosis of native coronary artery    Previous invasive cardiac testing was done at a facility in [place], [place].  Occluded diagonal, Diffuse LAD disease, Occluded Cx, and non obstructive RCA.   . Essential hypertension   . Headache   . History of pneumonia   . ICD (implantable cardioverter-defibrillator) in place   . Ischemic cardiomyopathy    LVEF 30-35%  . Mixed hyperlipidemia   . NSVT (nonsustained ventricular tachycardia) (HCC)   . Persistent atrial fibrillation (HCC)   . PVD (peripheral vascular disease) (HCC)   . Raynaud's phenomenon   . Statin intolerance   . Stroke [facility]) 2007   Total occlusion of the right internal carotid    Assessment: [name] with history of persistent Afib and anticardiolipin syndrome on warfarin PTA. Last dose taken 3/3 PM. INR 2.1 on admit. S/p [facility] today, pharmacy asked to start heparin 8 hrs after sheath removal.   INR  down 1.2 today after holding warfarin since 3/4. CBC stable/wnl. Heparin level now therapeutic at 0.39 after increasing drip rate to 1300 units/hr.  No overt bleeding or infusion issues noted.   Plans noted for VT ablation on 3/10. Will continue heparin until 0001 on 3/10 and restart warfarin after procedure per EP.  Goal of Therapy:  Heparin level 0.3-0.7 units/ml  Monitor platelets by anticoagulation protocol: Yes   Plan:  Continue IV heparin at 1300 units/hr Check 6-hr confirmatory HL Stop heparin on 3/10 at 0001 Continue to hold warfarin until after VT ablation on 3/10 Daily HL, INR, CBC, s/sx bleeding  Tama Headings, PharmD, BCPS PGY2 Cardiology Pharmacy Resident Phone: [phone] 03/06/2021  1:23 PM  Please check [email or web address] for unit-specific pharmacy phone  numbers.

## 2021-03-06 NOTE — Progress Notes (Signed)
ANTICOAGULATION CONSULT NOTE - Follow Up Consult  Pharmacy Consult for heparin Indication: persistent AFib and anticardiolipin syndrome  Labs: Recent Labs    03/04/21 0206 03/05/21 0044 03/05/21 0328 03/05/21 0801 03/05/21 2145 03/06/21 0530  HGB 14.2  --  14.0 13.9  13.9  --  13.2  HCT 43.8  --  42.1 41.0  41.0  --  40.9  PLT 223  --  205  --   --  173  LABPROT 24.1* 18.7*  --   --   --  15.0  INR 2.3* 1.6*  --   --   --  1.2  HEPARINUNFRC  --   --   --   --  0.11* 0.24*  CREATININE 0.85 1.04*  --   --   --   --     Assessment: 59yo female subtherapeutic on heparin after rate change though closer to goal; no gtt issues or signs of bleeding per RN.  Goal of Therapy:  Heparin level 0.3-0.7 units/ml   Plan:  Will increase heparin gtt by 2 units/kg/hr to 1300 units/hr and check level in 6 hours.    Vernard Gambles, PharmD, BCPS  03/06/2021,6:03 AM

## 2021-03-06 NOTE — Progress Notes (Signed)
ANTICOAGULATION CONSULT NOTE   Pharmacy Consult for Heparin Indication: persistent AFib, anticardiolipin syndrome  Allergies  Allergen Reactions  . Beef-Derived Products Anaphylaxis    From tick bite  . Metoclopramide Hcl Anaphylaxis    Non responsive  . Penicillins Anaphylaxis    Pt states she can take any cephalosporin (per pt. 01/01/19) Shock Has patient had a PCN reaction causing immediate rash, facial/tongue/throat swelling, SOB or lightheadedness with hypotension: Yes Has patient had a PCN reaction causing severe rash involving mucus membranes or skin necrosis: No Has patient had a PCN reaction that required hospitalization Yes Has patient had a PCN reaction occurring within the last 10 years: No If all of the above answers are "NO", then may proceed with Cephalosporin   . Pork-Derived Products Anaphylaxis    From tick bite. Has tolerated heparin/lovenox  . Metoprolol Other (See Comments)    Syncope   . Reglan [Metoclopramide]   . Statins Other (See Comments)    Myalgias    Patient Measurements: Height: 5\' 5"  (165.1 cm) Weight: 87.7 kg (193 lb 6.4 oz) IBW/kg (Calculated) : 57 Heparin dosing weight: 76 kg  Vital Signs: Temp: 98.3 F (36.8 C) (03/08 1955) Temp Source: Oral (03/08 1955) BP: 102/64 (03/08 1955) Pulse Rate: 61 (03/08 1955)  Labs: Recent Labs    03/04/21 0206 03/05/21 0044 03/05/21 0328 03/05/21 0801 03/05/21 2145 03/06/21 0530 03/06/21 1221 03/06/21 1945  HGB 14.2  --  14.0 13.9  13.9  --  13.2  --   --   HCT 43.8  --  42.1 41.0  41.0  --  40.9  --   --   PLT 223  --  205  --   --  173  --   --   LABPROT 24.1* 18.7*  --   --   --  15.0  --   --   INR 2.3* 1.6*  --   --   --  1.2  --   --   HEPARINUNFRC  --   --   --   --    < > 0.24* 0.39 0.43  CREATININE 0.85 1.04*  --   --   --  0.92  --   --    < > = values in this interval not displayed.    Estimated Creatinine Clearance: 73.8 mL/min (by C-G formula based on SCr of 0.92  mg/dL).   Medical History: Past Medical History:  Diagnosis Date  . Aneurysm of internal carotid artery   . Anticardiolipin antibody positive    Chronic Coumadin  . Brain aneurysm    followed by Dr. 05/06/21  . Chronic combined systolic (congestive) and diastolic (congestive) heart failure (HCC)   . Cocaine abuse in remission (HCC)   . Coronary atherosclerosis of native coronary artery    Previous invasive cardiac testing was done at a facility in [place], [place].  Occluded diagonal, Diffuse LAD disease, Occluded Cx, and non obstructive RCA.   . Essential hypertension   . Headache   . History of pneumonia   . ICD (implantable cardioverter-defibrillator) in place   . Ischemic cardiomyopathy    LVEF 30-35%  . Mixed hyperlipidemia   . NSVT (nonsustained ventricular tachycardia) (HCC)   . Persistent atrial fibrillation (HCC)   . PVD (peripheral vascular disease) (HCC)   . Raynaud's phenomenon   . Statin intolerance   . Stroke [facility]) 2007   Total occlusion of the right internal carotid    Assessment: [name] with  history of persistent Afib and anticardiolipin syndrome on warfarin PTA. Last dose taken 3/3 PM. INR 2.1 on admit. S/p [facility] today, pharmacy asked to start heparin 8 hrs after sheath removal.   INR down 1.2 today after holding warfarin since 3/4. CBC stable/wnl. Heparin level now therapeutic at 0.39 after increasing drip rate to 1300 units/hr.  No overt bleeding or infusion issues noted.   Plans noted for VT ablation on 3/10. Will continue heparin until 0001 on 3/10 and restart warfarin after procedure per EP.  Heparin level came back therapeutic again tonight. We will cont with the same rate and recheck in AM.   Goal of Therapy:  Heparin level 0.3-0.7 units/ml  Monitor platelets by anticoagulation protocol: Yes   Plan:  Continue IV heparin at 1300 units/hr Stop heparin on 3/10 at 0001 Continue to hold warfarin until after VT ablation on 3/10 Daily HL, INR, CBC,  s/sx bleeding  Ulyses Southward, PharmD, BCIDP, AAHIVP, CPP Infectious Disease Pharmacist 03/06/2021 8:35 PM

## 2021-03-06 NOTE — Care Management Important Message (Signed)
Important Message  Patient Details  Name: [name] [name] MRN: [id number] Date of Birth: Mar 18, 1961   Medicare Important Message Given:  Yes     Dorena Bodo 03/06/2021, 3:22 PM

## 2021-03-06 NOTE — TOC Progression Note (Signed)
Transition of Care (TOC) - Progression Note Heart Failure   Patient Details  Name: [name] [name] MRN: [id number] Date of Birth: [birth year]  Transition of Care [facility]) CM/SW Contact  Reese Senk, LCSWA Phone Number: 03/06/2021, 4:07 PM  Clinical Narrative:    CSW completed SDOH with the patient who denied having any needs at this time. However, patient reported that her heart failure medications are getting expensive and would like help if possible at the outpatient HV clinic. CSW provided the patient with social workers name and position and if anything changes to please reach out so that CSW can provide support.        Expected Discharge Plan and Services                                                 Social Determinants of Health (SDOH) Interventions Food Insecurity Interventions: Intervention Not Indicated Financial Strain Interventions: Intervention Not Indicated Housing Interventions: Intervention Not Indicated Transportation Interventions: Intervention Not Indicated  Readmission Risk Interventions No flowsheet data found.  Kaycee Mcgaugh, MSW, LCSWA [phone] Heart Failure Social Worker

## 2021-03-07 DIAGNOSIS — T82191A Other mechanical complication of cardiac pulse generator (battery), initial encounter: Secondary | ICD-10-CM | POA: Diagnosis not present

## 2021-03-07 DIAGNOSIS — I5042 Chronic combined systolic (congestive) and diastolic (congestive) heart failure: Secondary | ICD-10-CM | POA: Diagnosis not present

## 2021-03-07 DIAGNOSIS — I472 Ventricular tachycardia: Secondary | ICD-10-CM | POA: Diagnosis not present

## 2021-03-07 DIAGNOSIS — I429 Cardiomyopathy, unspecified: Secondary | ICD-10-CM | POA: Diagnosis not present

## 2021-03-07 LAB — PROTIME-INR
INR: 1.2 (ref 0.8–1.2)
Prothrombin Time: 14.5 seconds (ref 11.4–15.2)

## 2021-03-07 LAB — CBC
HCT: 41.8 % (ref 36.0–46.0)
Hemoglobin: 13.3 g/dL (ref 12.0–15.0)
MCH: 29.6 pg (ref 26.0–34.0)
MCHC: 31.8 g/dL (ref 30.0–36.0)
MCV: 92.9 fL (ref 80.0–100.0)
Platelets: 179 10*3/uL (ref 150–400)
RBC: 4.5 MIL/uL (ref 3.87–5.11)
RDW: 12.9 % (ref 11.5–15.5)
WBC: 7.4 10*3/uL (ref 4.0–10.5)
nRBC: 0 % (ref 0.0–0.2)

## 2021-03-07 LAB — BASIC METABOLIC PANEL
Anion gap: 12 (ref 5–15)
BUN: 19 mg/dL (ref 6–20)
CO2: 27 mmol/L (ref 22–32)
Calcium: 8.9 mg/dL (ref 8.9–10.3)
Chloride: 97 mmol/L — ABNORMAL LOW (ref 98–111)
Creatinine, Ser: 1.01 mg/dL — ABNORMAL HIGH (ref 0.44–1.00)
GFR, Estimated: >60 mL/min (ref >60)
Glucose, Bld: 87 mg/dL (ref 70–99)
Potassium: 4.2 mmol/L (ref 3.5–5.1)
Sodium: 136 mmol/L (ref 135–145)

## 2021-03-07 LAB — HEPARIN LEVEL (UNFRACTIONATED): Heparin Unfractionated: 0.31 IU/mL (ref 0.30–0.70)

## 2021-03-07 NOTE — Plan of Care (Signed)
°  Problem: Education: °Goal: Knowledge of General Education information will improve °Description: Including pain rating scale, medication(s)/side effects and non-pharmacologic comfort measures °Outcome: Progressing °  °Problem: Health Behavior/Discharge Planning: °Goal: Ability to manage health-related needs will improve °Outcome: Progressing °  °Problem: Activity: °Goal: Risk for activity intolerance will decrease °Outcome: Progressing °  °Problem: Nutrition: °Goal: Adequate nutrition will be maintained °Outcome: Progressing °  °Problem: Coping: °Goal: Level of anxiety will decrease °Outcome: Progressing °  °Problem: Elimination: °Goal: Will not experience complications related to bowel motility °Outcome: Progressing °Goal: Will not experience complications related to urinary retention °Outcome: Progressing °  °Problem: Safety: °Goal: Ability to remain free from injury will improve °Outcome: Progressing °  °Problem: Skin Integrity: °Goal: Risk for impaired skin integrity will decrease °Outcome: Progressing °  °

## 2021-03-07 NOTE — Progress Notes (Signed)
Patient ID: [name] [name], female   DOB: 09-04-1961, 60 y.o.   MRN: [id number]     Advanced Heart Failure Rounding Note  PCP-Cardiologist: Nona Dell, MD   Subjective:    Admitted  for ICD shocks x 4 for VT. 3/7 had RHC/LHC with stable coronaries, low filling pressures, and preserved cardiac output. Post cath torsemide cut back.   Rhythm stable. No VT overnight.   Carotid dopplers: Right Carotid: Evidence consistent with a total occlusion of the right ICA. Left Carotid: Velocities in the left ICA are consistent with a 1-39% stenosis.   Objective:   Weight Range: 87.7 kg Body mass index is 32.18 kg/m.   Vital Signs:   Temp:  [97 F (36.1 C)-98.3 F (36.8 C)] 97.5 F (36.4 C) (03/09 0728) Pulse Rate:  [53-67] 58 (03/09 0728) Resp:  [13-18] 13 (03/09 0728) BP: (82-123)/(49-67) 123/54 (03/09 0728) SpO2:  [93 %-99 %] 99 % (03/09 0728) Last BM Date: 03/03/21  Weight change: Filed Weights   03/01/21 2051 03/02/21 1241 03/05/21 0418  Weight: 86.2 kg 87.2 kg 87.7 kg    Intake/Output:   Intake/Output Summary (Last 24 hours) at 03/07/2021 0733 Last data filed at 03/07/2021 0255 Gross per 24 hour  Intake 1367.98 ml  Output 3700 ml  Net -2332.02 ml      Physical Exam    General:  Well appearing. No respiratory difficulty HEENT: normal Neck: supple. no JVD. Carotids 2+ bilat; no bruits. No lymphadenopathy or thyromegaly appreciated. Cor: PMI nondisplaced. Regular rate & rhythm. No rubs, gallops or murmurs. Lungs: clear Abdomen: soft, nontender, nondistended. No hepatosplenomegaly. No bruits or masses. Good bowel sounds. Extremities: no cyanosis, clubbing, rash, edema Neuro: alert & oriented x 3, cranial nerves grossly intact. moves all 4 extremities w/o difficulty. Affect pleasant.    Telemetry   NSR mid 60s no VT overnight.   Labs    CBC Recent Labs    03/06/21 0530 03/07/21 0101  WBC 7.5 7.4  HGB 13.2 13.3  HCT 40.9 41.8  MCV 91.5 92.9  PLT 173 179    Basic Metabolic Panel Recent Labs    96/75/91 0044 03/05/21 0801 03/06/21 0530 03/07/21 0101  NA 133*   < > 135 136  K 3.7   < > 4.6 4.2  CL 98  --  98 97*  CO2 24  --  29 27  GLUCOSE 110*  --  100* 87  BUN 25*  --  18 19  CREATININE 1.04*  --  0.92 1.01*  CALCIUM 8.8*  --  8.8* 8.9  MG 2.1  --   --   --    < > = values in this interval not displayed.   Liver Function Tests No results for input(s): AST, ALT, ALKPHOS, BILITOT, PROT, ALBUMIN in the last 72 hours. No results for input(s): LIPASE, AMYLASE in the last 72 hours. Cardiac Enzymes No results for input(s): CKTOTAL, CKMB, CKMBINDEX, TROPONINI in the last 72 hours.  BNP: BNP (last 3 results) Recent Labs    12/14/20 1921 02/27/21 1605 03/01/21 2125  BNP 209.0* 320.5* 194.0*    ProBNP (last 3 results) No results for input(s): PROBNP in the last 8760 hours.   D-Dimer No results for input(s): DDIMER in the last 72 hours. Hemoglobin A1C No results for input(s): HGBA1C in the last 72 hours. Fasting Lipid Panel No results for input(s): CHOL, HDL, LDLCALC, TRIG, CHOLHDL, LDLDIRECT in the last 72 hours. Thyroid Function Tests No results for input(s): TSH, T4TOTAL,  T3FREE, THYROIDAB in the last 72 hours.  Invalid input(s): FREET3  Other results:   Imaging    No results found.   Medications:     Scheduled Medications: . amiodarone  400 mg Oral BID  . carvedilol  6.25 mg Oral BID WC  . clopidogrel  75 mg Oral Q breakfast  . dapagliflozin propanediol  10 mg Oral Daily  . digoxin  0.125 mg Oral Daily  . ezetimibe  10 mg Oral Daily  . isosorbide mononitrate  30 mg Oral Daily  . melatonin  3 mg Oral QHS  . mexiletine  150 mg Oral Q8H  . ranolazine  500 mg Oral BID  . sacubitril-valsartan  1 tablet Oral BID  . sodium chloride flush  3 mL Intravenous Q12H  . sodium chloride flush  3 mL Intravenous Q12H  . spironolactone  25 mg Oral Daily  . torsemide  60 mg Oral Daily    Infusions: . sodium  chloride    . heparin 1,300 Units/hr (03/06/21 1239)    PRN Medications: sodium chloride, acetaminophen, magnesium hydroxide, nitroGLYCERIN, ondansetron (ZOFRAN) IV, oxyCODONE-acetaminophen **AND** oxyCODONE, prochlorperazine, sodium chloride flush, traZODone   Assessment/Plan   1. VT: Suspect scar-mediated VT, no new coronary disease on angiography this admission.  Prior VT in 1/22.  Had PCI to LCx in 1/22. HS-TnI up to 3070 but appears to be demand ischemia with VT and shocks x 4 rather than true ACS as no new disease on coronary angiography. Shocks by ICD were ineffective.  - Continue  amiodarone 400 mg bid.  - Continue mexiletine 150 mg Q8H  - EP evaluation, plan for VT ablation on Thursday. After this, will need ICD system revision given ineffective shocks.  2. CAD: MI in 2008 in [facility], cath at the time showed totally occluded diagonal, totally occluded LCX, and diffuse LAD disease.  No intervention. LHC was done in 12/21 due to VT, this showed chronically occluded D1 and 95% proximal LCx.  Initially, no PCI due to thrombocytopenia.  She eventually had PCI to proximal LCx in 1/22.  Elevated troponin appears to be demand ischemia as no new CAD noted on coronary angiography this admission.  - No chest pain.  - Continue Zetia, has not tolerated statins.  Good lipids in 12/21.   - Continue Plavix 75 mg daily x at least 6 months post PCI and ideally 1 year. - She is off ASA given use of warfarin and Plavix.  3. Atrial fibrillation: TEE-guided DCCV to NSR in 12/21.  She is in NSR. No breakthrough Afib on device interrogation.  - continue amiodarone  - If atrial fibrillation recurs, will need to consider Tikosyn versus ablation.  - Coumadin on hold for procedures, continue IV heparin gtt.  4. Carotid stenosis: Known RICA occlusion. Carotid dopplers repeated this admit, Rt totally occluded, Left w/ 1-39% stenosis.  5. 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  - She is on chronic  coumadin, now on hold for procedures.  Continue heparin gtt.  6. Chronic systolic CHF: Ischemic cardiomyopathy.  [employer] ICD.  Echo (8/21) with EF 15-20%, anteroseptal AK, RV normal, severe LAE, mild MR.  TEE (12/21) with EF 25-30%.  RHC in 12/21 with preserved cardiac output and optimized filling pressures.  NYHA class II symptoms, not volume overloaded on exam.  RHC showed preserved cardiac output and low filling pressures.  - Volume status stable.  - Continue Entresto 24-26 bid  - Continue Farxiga 10 mg daily.   - Continue  spironolactone 25 mg daily.  - Continue Coreg 6.25 mg bid.  - Continue torsemide to 60 mg daily  - Continue digoxin, level ok.  7. Smoking: Active smoker, wants to stop on her own.  8. PAD: She denies claudication.  - Needs to quit smoking.  9. 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  Not low this admission.  - She will see Dr. Truett Perna (hematology).  10. Mitral regurgitation: Moderate on 12/21 TEE, suspect functional.   Length of Stay: 5  Azahel Belcastro, PA-C  03/07/2021, 7:33 AM  Advanced Heart Failure Team Pager [phone] (M-F; 7a - 5p)  Please contact [facility] for night-coverage after hours (5p -7a ) and weekends on [email or web address]

## 2021-03-07 NOTE — Progress Notes (Signed)
Patient ID: [name] [name], female   DOB: 1961/08/28, 60 y.o.   MRN: [id number]     Advanced Heart Failure Rounding Note  PCP-Cardiologist: Nona Dell, MD   Subjective:    - Admitted  for ICD shocks x 4 for VT. - 3/7 had RHC/LHC with stable coronaries, low filling pressures, and preserved cardiac output. Post cath torsemide cut back.   No dyspnea, no complaints.    Objective:   Weight Range: 87.7 kg Body mass index is 32.18 kg/m.   Vital Signs:   Temp:  [97 F (36.1 C)-98.3 F (36.8 C)] 97.5 F (36.4 C) (03/09 0728) Pulse Rate:  [53-67] 58 (03/09 0728) Resp:  [13-18] 13 (03/09 0728) BP: (82-123)/(49-67) 123/54 (03/09 0728) SpO2:  [93 %-99 %] 99 % (03/09 0728) Last BM Date: 03/03/21  Weight change: Filed Weights   03/01/21 2051 03/02/21 1241 03/05/21 0418  Weight: 86.2 kg 87.2 kg 87.7 kg    Intake/Output:   Intake/Output Summary (Last 24 hours) at 03/07/2021 0801 Last data filed at 03/07/2021 0255 Gross per 24 hour  Intake 1114.98 ml  Output 3700 ml  Net -2585.02 ml      Physical Exam  General: NAD Neck: No JVD, no thyromegaly or thyroid nodule.  Lungs: Clear to auscultation bilaterally with normal respiratory effort. CV: Nondisplaced PMI.  Heart regular S1/S2, no S3/S4, no murmur.  No peripheral edema.   Abdomen: Soft, nontender, no hepatosplenomegaly, no distention.  Skin: Intact without lesions or rashes.  Neurologic: Alert and oriented x 3.  Psych: Normal affect. Extremities: No clubbing or cyanosis.  HEENT: Normal.    Telemetry   NSR no VT over night (personally reviewed).   Labs    CBC Recent Labs    03/06/21 0530 03/07/21 0101  WBC 7.5 7.4  HGB 13.2 13.3  HCT 40.9 41.8  MCV 91.5 92.9  PLT 173 179   Basic Metabolic Panel Recent Labs    09/21/29 0044 03/05/21 0801 03/06/21 0530 03/07/21 0101  NA 133*   < > 135 136  K 3.7   < > 4.6 4.2  CL 98  --  98 97*  CO2 24  --  29 27  GLUCOSE 110*  --  100* 87  BUN 25*  --  18 19   CREATININE 1.04*  --  0.92 1.01*  CALCIUM 8.8*  --  8.8* 8.9  MG 2.1  --   --   --    < > = values in this interval not displayed.   Liver Function Tests No results for input(s): AST, ALT, ALKPHOS, BILITOT, PROT, ALBUMIN in the last 72 hours. No results for input(s): LIPASE, AMYLASE in the last 72 hours. Cardiac Enzymes No results for input(s): CKTOTAL, CKMB, CKMBINDEX, TROPONINI in the last 72 hours.  BNP: BNP (last 3 results) Recent Labs    12/14/20 1921 02/27/21 1605 03/01/21 2125  BNP 209.0* 320.5* 194.0*    ProBNP (last 3 results) No results for input(s): PROBNP in the last 8760 hours.   D-Dimer No results for input(s): DDIMER in the last 72 hours. Hemoglobin A1C No results for input(s): HGBA1C in the last 72 hours. Fasting Lipid Panel No results for input(s): CHOL, HDL, LDLCALC, TRIG, CHOLHDL, LDLDIRECT in the last 72 hours. Thyroid Function Tests No results for input(s): TSH, T4TOTAL, T3FREE, THYROIDAB in the last 72 hours.  Invalid input(s): FREET3  Other results:   Imaging    No results found.   Medications:     Scheduled Medications: .  amiodarone  400 mg Oral BID  . carvedilol  6.25 mg Oral BID WC  . clopidogrel  75 mg Oral Q breakfast  . dapagliflozin propanediol  10 mg Oral Daily  . digoxin  0.125 mg Oral Daily  . ezetimibe  10 mg Oral Daily  . isosorbide mononitrate  30 mg Oral Daily  . melatonin  3 mg Oral QHS  . mexiletine  150 mg Oral Q8H  . ranolazine  500 mg Oral BID  . sacubitril-valsartan  1 tablet Oral BID  . sodium chloride flush  3 mL Intravenous Q12H  . sodium chloride flush  3 mL Intravenous Q12H  . spironolactone  25 mg Oral Daily  . torsemide  60 mg Oral Daily    Infusions: . sodium chloride    . heparin 1,300 Units/hr (03/06/21 1239)    PRN Medications: sodium chloride, acetaminophen, magnesium hydroxide, nitroGLYCERIN, ondansetron (ZOFRAN) IV, oxyCODONE-acetaminophen **AND** oxyCODONE, prochlorperazine, sodium  chloride flush, traZODone   Assessment/Plan   1. VT: Suspect scar-mediated VT, no new coronary disease on angiography this admission.  Prior VT in 1/22.  Had PCI to LCx in 1/22. HS-TnI up to 3070 but appears to be demand ischemia with VT and shocks x 4 rather than true ACS as no new disease on coronary angiography. Shocks by ICD were ineffective.  - Continue  amiodarone 400 mg bid.  - Continue mexiletine.   - Plan for VT ablation on Thursday. After this, will need ICD system revision given ineffective shocks.  2. CAD: MI in 2008 in [facility], cath at the time showed totally occluded diagonal, totally occluded LCX, and diffuse LAD disease.  No intervention. LHC was done in 12/21 due to VT, this showed chronically occluded D1 and 95% proximal LCx.  Initially, no PCI due to thrombocytopenia.  She eventually had PCI to proximal LCx in 1/22.  Elevated troponin appears to be demand ischemic as no new CAD noted on coronary angiography this admission.  No chest pain. - Continue Zetia, has not tolerated statins.  Good lipids in 12/21.   - Continue Plavix 75 mg daily x at least 6 months post PCI and ideally 1 year. - She is off ASA given use of warfarin and Plavix.  3. Atrial fibrillation: TEE-guided DCCV to NSR in 12/21.  She is in NSR. No breakthrough Afib on device interrogation.  - continue amiodarone  - If atrial fibrillation recurs, will need to consider Tikosyn versus ablation.  - Coumadin on hold for procedures, continue IV heparin gtt. Can start back on coumadin evening of 3/10 after VT ablation.   4. Carotid stenosis: Known RICA occlusion. 03/05/21 dopplers with TO RICA, 1-39% LICA.   4. 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  - She is on chronic coumadin, now on hold for procedures.  Continue heparin gtt.  6. Chronic systolic CHF: Ischemic cardiomyopathy.  [employer] ICD.  Echo (8/21) with EF 15-20%, anteroseptal AK, RV normal, severe LAE, mild MR.  TEE (12/21) with EF 25-30%.   RHC in 12/21 with preserved cardiac output and optimized filling pressures.  NYHA class II symptoms, not volume overloaded on exam.  RHC showed preserved cardiac output and low filling pressures.  Volume status stable.  - Continue Entresto 24-26 bid  - Continue Farxiga 10 mg daily.   - Continue spironolactone 25 mg daily.  - Continue Coreg 6.25 mg bid.  - Continue torsemide to 60 mg daily  - Continue digoxin, level ok.  7. Smoking: Active smoker, wants to  stop on her own.  8. PAD: She denies claudication.  - Needs to quit smoking.  9. 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  Not low this admission.  - She will see Dr. Truett Perna (hematology).  10. Mitral regurgitation: Moderate on 12/21 TEE, suspect functional.   Length of Stay: 5  [name], MD  03/07/2021, 8:01 AM  Advanced Heart Failure Team Pager [phone] (M-F; 7a - 5p)  Please contact [facility] for night-coverage after hours (4p -7a ) and weekends on [email or web address]

## 2021-03-07 NOTE — Progress Notes (Signed)
ANTICOAGULATION CONSULT NOTE   Pharmacy Consult for Heparin Indication: persistent AFib, anticardiolipin syndrome  Allergies  Allergen Reactions  . Beef-Derived Products Anaphylaxis    From tick bite  . Metoclopramide Hcl Anaphylaxis    Non responsive  . Penicillins Anaphylaxis    Pt states she can take any cephalosporin (per pt. 01/01/19) Shock Has patient had a PCN reaction causing immediate rash, facial/tongue/throat swelling, SOB or lightheadedness with hypotension: Yes Has patient had a PCN reaction causing severe rash involving mucus membranes or skin necrosis: No Has patient had a PCN reaction that required hospitalization Yes Has patient had a PCN reaction occurring within the last 10 years: No If all of the above answers are "NO", then may proceed with Cephalosporin   . Pork-Derived Products Anaphylaxis    From tick bite. Has tolerated heparin/lovenox  . Metoprolol Other (See Comments)    Syncope   . Reglan [Metoclopramide]   . Statins Other (See Comments)    Myalgias    Patient Measurements: Height: 5\' 5"  (165.1 cm) Weight: 87.7 kg (193 lb 6.4 oz) IBW/kg (Calculated) : 57 Heparin dosing weight: 76 kg  Vital Signs: Temp: 97.5 F (36.4 C) (03/09 0728) Temp Source: Oral (03/09 0728) BP: 123/54 (03/09 0728) Pulse Rate: 58 (03/09 0728)  Labs: Recent Labs    03/05/21 0044 03/05/21 0328 03/05/21 0328 03/05/21 0801 03/05/21 2145 03/06/21 0530 03/06/21 1221 03/06/21 1945 03/07/21 0101  HGB  --  14.0   < > 13.9  13.9  --  13.2  --   --  13.3  HCT  --  42.1   < > 41.0  41.0  --  40.9  --   --  41.8  PLT  --  205  --   --   --  173  --   --  179  LABPROT 18.7*  --   --   --   --  15.0  --   --  14.5  INR 1.6*  --   --   --   --  1.2  --   --  1.2  HEPARINUNFRC  --   --   --   --    < > 0.24* 0.39 0.43 0.31  CREATININE 1.04*  --   --   --   --  0.92  --   --  1.01*   < > = values in this interval not displayed.    Estimated Creatinine Clearance: 67.2  mL/min (A) (by C-G formula based on SCr of 1.01 mg/dL (H)).   Medical History: Past Medical History:  Diagnosis Date  . Aneurysm of internal carotid artery   . Anticardiolipin antibody positive    Chronic Coumadin  . Brain aneurysm    followed by Dr. 05/07/21  . Chronic combined systolic (congestive) and diastolic (congestive) heart failure (HCC)   . Cocaine abuse in remission (HCC)   . Coronary atherosclerosis of native coronary artery    Previous invasive cardiac testing was done at a facility in [place], [place].  Occluded diagonal, Diffuse LAD disease, Occluded Cx, and non obstructive RCA.   . Essential hypertension   . Headache   . History of pneumonia   . ICD (implantable cardioverter-defibrillator) in place   . Ischemic cardiomyopathy    LVEF 30-35%  . Mixed hyperlipidemia   . NSVT (nonsustained ventricular tachycardia) (HCC)   . Persistent atrial fibrillation (HCC)   . PVD (peripheral vascular disease) (HCC)   . Raynaud's phenomenon   .  Statin intolerance   . Stroke [facility]) 2007   Total occlusion of the right internal carotid    Assessment: [name] with history of persistent Afib and anticardiolipin syndrome on warfarin PTA. Last dose taken 3/3 PM. INR 2.1 on admit. S/p [facility] today, pharmacy asked to start heparin 8 hrs after sheath removal.   INR remains subtherapeutic after holding warfarin since 3/4. CBC stable/wnl. Heparin level remains therapeutic at 0.31 on drip rate 1300 units/hr.  No overt bleeding or infusion issues noted.   Plans noted for VT ablation on 3/10. Will continue heparin until 0001 on 3/10 and restart warfarin after procedure per EP.  Goal of Therapy:  Heparin level 0.3-0.7 units/ml  Monitor platelets by anticoagulation protocol: Yes   Plan:  Continue IV heparin at 1300 units/hr Stop heparin on 3/10 at 0001 Continue to hold warfarin until after VT ablation on 3/10 Daily HL, INR, CBC, s/sx bleeding  Tama Headings, PharmD, BCPS PGY2  Cardiology Pharmacy Resident Phone: [phone] 03/07/2021  7:42 AM  Please check [email or web address] for unit-specific pharmacy phone numbers.

## 2021-03-07 NOTE — Progress Notes (Signed)
CARDIAC REHAB PHASE I   PRE:  Rate/Rhythm: 65 SR  BP:  Supine:   Sitting: 106/66  Standing:    SaO2: 96%RA  MODE:  Ambulation: 140 ft   POST:  Rate/Rhythm: 84 SR  BP:  Supine:   Sitting: 118/71  Standing:    SaO2: 95%RA 1315-1348 Pt walked 140 ft on RA pushing IV pole. Tired easily. Back to room and to bed. Visitor in to see pt.  Sats good on RA. No VT during walk. Pt stated she has been in contact with [facility] but has not been able to attend due to health issues and probable high copay per visit. Stated she is going to check with insurance to be sure.    Luetta Nutting, RN BSN  03/07/2021 1:45 PM

## 2021-03-07 NOTE — Progress Notes (Signed)
Electrophysiology Rounding Note  Patient Name: [name] [name] Date of Encounter: 03/07/2021  Primary Cardiologist: Nona Dell, MD Electrophysiologist: Lewayne Bunting, MD   Subjective   The patient is doing well today.  At this time, the patient denies chest pain, shortness of breath, or any new concerns.  Inpatient Medications    Scheduled Meds: . amiodarone  400 mg Oral BID  . carvedilol  6.25 mg Oral BID WC  . clopidogrel  75 mg Oral Q breakfast  . dapagliflozin propanediol  10 mg Oral Daily  . digoxin  0.125 mg Oral Daily  . ezetimibe  10 mg Oral Daily  . isosorbide mononitrate  30 mg Oral Daily  . melatonin  3 mg Oral QHS  . mexiletine  150 mg Oral Q8H  . ranolazine  500 mg Oral BID  . sacubitril-valsartan  1 tablet Oral BID  . sodium chloride flush  3 mL Intravenous Q12H  . sodium chloride flush  3 mL Intravenous Q12H  . spironolactone  25 mg Oral Daily  . torsemide  60 mg Oral Daily   Continuous Infusions: . sodium chloride    . heparin 1,300 Units/hr (03/06/21 1239)   PRN Meds: sodium chloride, acetaminophen, magnesium hydroxide, nitroGLYCERIN, ondansetron (ZOFRAN) IV, oxyCODONE-acetaminophen **AND** oxyCODONE, prochlorperazine, sodium chloride flush, traZODone   Vital Signs    Vitals:   03/07/21 0247 03/07/21 0300 03/07/21 0335 03/07/21 0728  BP:   112/60 (!) 123/54  Pulse: (!) 55 (!) 53 (!) 53 (!) 58  Resp: 18 16 14 13   Temp:   97.6 F (36.4 C) (!) 97.5 F (36.4 C)  TempSrc:   Oral Oral  SpO2: 99% 97% 99% 99%  Weight:      Height:        Intake/Output Summary (Last 24 hours) at 03/07/2021 0742 Last data filed at 03/07/2021 0255 Gross per 24 hour  Intake 1367.98 ml  Output 3700 ml  Net -2332.02 ml   Filed Weights   03/01/21 2051 03/02/21 1241 03/05/21 0418  Weight: 86.2 kg 87.2 kg 87.7 kg    Physical Exam    GEN- The patient is well appearing, alert and oriented x 3 today.   Head- normocephalic, atraumatic Eyes-  Sclera clear,  conjunctiva pink Ears- hearing intact Oropharynx- clear Neck- supple Lungs- Clear to ausculation bilaterally, normal work of breathing Heart- Regular rate and rhythm, no murmurs, rubs or gallops GI- soft, NT, ND, + BS Extremities- no clubbing or cyanosis. No edema Skin- no rash or lesion Psych- euthymic mood, full affect Neuro- strength and sensation are intact  Labs    CBC Recent Labs    03/06/21 0530 03/07/21 0101  WBC 7.5 7.4  HGB 13.2 13.3  HCT 40.9 41.8  MCV 91.5 92.9  PLT 173 179   Basic Metabolic Panel Recent Labs    05/07/21 0044 03/05/21 0801 03/06/21 0530 03/07/21 0101  NA 133*   < > 135 136  K 3.7   < > 4.6 4.2  CL 98  --  98 97*  CO2 24  --  29 27  GLUCOSE 110*  --  100* 87  BUN 25*  --  18 19  CREATININE 1.04*  --  0.92 1.01*  CALCIUM 8.8*  --  8.8* 8.9  MG 2.1  --   --   --    < > = values in this interval not displayed.   Liver Function Tests No results for input(s): AST, ALT, ALKPHOS, BILITOT, PROT, ALBUMIN in the  last 72 hours. No results for input(s): LIPASE, AMYLASE in the last 72 hours. Cardiac Enzymes No results for input(s): CKTOTAL, CKMB, CKMBINDEX, TROPONINI in the last 72 hours.   Telemetry    NSR 60s, no further VT (personally reviewed)  Radiology    CARDIAC CATHETERIZATION  Result Date: 03/05/2021 1. Low filling pressures. 2. Preserved cardiac output. 3. Stable coronaries, patent LCx stent.  Known chronic occlusion of D1.   VAS US CAROTID  Result Date: 03/05/2021 Carotid Arterial Duplex Study Indications:       CVA and Carotid artery disease. Risk Factors:      Hyperlipidemia, current smoker, coronary artery disease,                    prior CVA, PAD. Comparison Study:  3/17 Right Occluded Left 1-39% Performing Technologist: Clint Guy RVT  Examination Guidelines: A complete evaluation includes B-mode imaging, spectral Doppler, color Doppler, and power Doppler as needed of all accessible portions of each vessel. Bilateral testing  is considered an integral part of a complete examination. Limited examinations for reoccurring indications may be performed as noted.  Right Carotid Findings: +----------+--------+--------+--------+------------------+--------+           PSV cm/sEDV cm/sStenosisPlaque DescriptionComments +----------+--------+--------+--------+------------------+--------+ CCA Prox  89                                                 +----------+--------+--------+--------+------------------+--------+ CCA Distal49                                                 +----------+--------+--------+--------+------------------+--------+ ICA Prox                  Occluded                           +----------+--------+--------+--------+------------------+--------+ ICA Mid                   Occluded                           +----------+--------+--------+--------+------------------+--------+ ICA Distal                Occluded                           +----------+--------+--------+--------+------------------+--------+ ECA       191                                                +----------+--------+--------+--------+------------------+--------+ +----------+--------+-------+--------+-------------------+           PSV cm/sEDV cmsDescribeArm Pressure (mmHG) +----------+--------+-------+--------+-------------------+ [id number]                                        +----------+--------+-------+--------+-------------------+ +---------+--------+--+--------+--+ VertebralPSV cm/s38EDV cm/s14 +---------+--------+--+--------+--+  Left Carotid Findings: +----------+--------+--------+--------+------------------+--------+           PSV cm/sEDV cm/sStenosisPlaque DescriptionComments +----------+--------+--------+--------+------------------+--------+ CCA Prox  130  27                                         +----------+--------+--------+--------+------------------+--------+  CCA Distal87      21                                         +----------+--------+--------+--------+------------------+--------+ ICA Prox  93      27      1-39%   heterogenous               +----------+--------+--------+--------+------------------+--------+ ICA Distal75      26                                         +----------+--------+--------+--------+------------------+--------+ ECA       117                                                +----------+--------+--------+--------+------------------+--------+ +----------+--------+--------+--------+-------------------+           PSV cm/sEDV cm/sDescribeArm Pressure (mmHG) +----------+--------+--------+--------+-------------------+ [id number]                                         +----------+--------+--------+--------+-------------------+ +---------+--------+--+--------+--+ VertebralPSV cm/s33EDV cm/s11 +---------+--------+--+--------+--+   Summary: Right Carotid: Evidence consistent with a total occlusion of the right ICA. Left Carotid: Velocities in the left ICA are consistent with a 1-39% stenosis. Vertebrals:  Bilateral vertebral arteries demonstrate antegrade flow. Subclavians: Normal flow hemodynamics were seen in bilateral subclavian              arteries. *See table(s) above for measurements and observations.  Electronically signed by Fabienne Bruns MD on 03/05/2021 at 9:13:32 PM.    Final     Patient Profile     Keiaira Donlan Hooveris a 60 y.o.femalewith a history of of CAD (PCI of LCx in Jan 2022), ICM (s/pBoston Scientific dual chamber ICD implant 2017), cocaine abuse (in remission), CVA, AFIB, antiphospholipid syndrome (on Coumadin), who presented to the hospital for worsening SOB and 4 shocks from her ICDwho is being seen today for the evaluation of recurrent VTat the request of Dr. Shirlee Latch.  Assessment & Plan    1.Recurrent VT Had been quiescent since January with revascularization and amio.  Presented with ICD shock x 4.  Continue amio 400 mg BID Continue mexitil Maximize anti-ischemic therapies(imdur recently added) Will continue AADs. Per Dr. Lalla Brothers, will perform scar modification/ablation even if VT cannot be induced in lab.   2.Chronic systolic dysfunction s/p Boston Scientificdual chamber ICD NYHA III symptoms.  Low filling pressures on cath 03/05/21 On an appropriate medical regimen Normal ICD function She has been eating multiple high sodium meals this admisison ([employer] for lunch, [employer] for dinner 3/7) Has been counseled on salt and fluid restrictions.   3. PVCs Has previously been described as a poorablation candidate with multiple morphologies.  Continue amio as above  4. Anti-phospholipid syndrome Continue heparin until 0000 03/08/2021 (Stop at midnight tonight).  Would plan on starting coumadin  evening of 03/08/21 post ablation.   For questions or updates, please contact [facility] Please consult [email or web address] for contact info under Cardiology/STEMI.  Signed, Graciella Freer, PA-C  03/07/2021, 7:42 AM

## 2021-03-08 ENCOUNTER — Inpatient Hospital Stay (HOSPITAL_COMMUNITY): Payer: Medicare PPO | Admitting: Anesthesiology

## 2021-03-08 ENCOUNTER — Inpatient Hospital Stay (HOSPITAL_COMMUNITY)
Admission: EM | Disposition: A | Discharge status: Home / Self Care | Payer: Self-pay | Source: Home / Self Care | Attending: Cardiology

## 2021-03-08 ENCOUNTER — Encounter (HOSPITAL_COMMUNITY): Payer: Medicare PPO

## 2021-03-08 DIAGNOSIS — I255 Ischemic cardiomyopathy: Secondary | ICD-10-CM

## 2021-03-08 DIAGNOSIS — I472 Ventricular tachycardia: Secondary | ICD-10-CM

## 2021-03-08 DIAGNOSIS — I5042 Chronic combined systolic (congestive) and diastolic (congestive) heart failure: Secondary | ICD-10-CM | POA: Diagnosis not present

## 2021-03-08 HISTORY — PX: V TACH ABLATION: EP1227

## 2021-03-08 LAB — CBC
HCT: 40.2 % (ref 36.0–46.0)
Hemoglobin: 13.2 g/dL (ref 12.0–15.0)
MCH: 29.7 pg (ref 26.0–34.0)
MCHC: 32.8 g/dL (ref 30.0–36.0)
MCV: 90.3 fL (ref 80.0–100.0)
Platelets: 140 10*3/uL — ABNORMAL LOW (ref 150–400)
RBC: 4.45 MIL/uL (ref 3.87–5.11)
RDW: 12.8 % (ref 11.5–15.5)
WBC: 8.1 10*3/uL (ref 4.0–10.5)
nRBC: 0 % (ref 0.0–0.2)

## 2021-03-08 LAB — LACTIC ACID, PLASMA: Lactic Acid, Venous: 1.8 mmol/L (ref 0.5–1.9)

## 2021-03-08 LAB — PROTIME-INR
INR: 1.1 (ref 0.8–1.2)
Prothrombin Time: 13.5 seconds (ref 11.4–15.2)

## 2021-03-08 LAB — BASIC METABOLIC PANEL
Anion gap: 10 (ref 5–15)
BUN: 15 mg/dL (ref 6–20)
CO2: 29 mmol/L (ref 22–32)
Calcium: 8.6 mg/dL — ABNORMAL LOW (ref 8.9–10.3)
Chloride: 95 mmol/L — ABNORMAL LOW (ref 98–111)
Creatinine, Ser: 0.94 mg/dL (ref 0.44–1.00)
GFR, Estimated: >60 mL/min (ref >60)
Glucose, Bld: 120 mg/dL — ABNORMAL HIGH (ref 70–99)
Potassium: 4 mmol/L (ref 3.5–5.1)
Sodium: 134 mmol/L — ABNORMAL LOW (ref 135–145)

## 2021-03-08 LAB — POCT ACTIVATED CLOTTING TIME
Activated Clotting Time: 267 seconds
Activated Clotting Time: 285 seconds
Activated Clotting Time: 309 seconds
Activated Clotting Time: 285 seconds
Activated Clotting Time: 291 seconds

## 2021-03-08 SURGERY — V TACH ABLATION
Anesthesia: General

## 2021-03-08 MED ORDER — HEPARIN (PORCINE) IN NACL 1000-0.9 UT/500ML-% IV SOLN
INTRAVENOUS | Status: AC
  Filled 2021-03-08: qty 500

## 2021-03-08 MED ORDER — HEPARIN SODIUM (PORCINE) 1000 UNIT/ML IJ SOLN
INTRAMUSCULAR | Status: AC
  Filled 2021-03-08: qty 1

## 2021-03-08 MED ORDER — LIDOCAINE 2% (20 MG/ML) 5 ML SYRINGE
INTRAMUSCULAR | Status: DC | PRN
  Administered 2021-03-08: 100 mg via INTRAVENOUS

## 2021-03-08 MED ORDER — PHENYLEPHRINE 40 MCG/ML (10ML) SYRINGE FOR IV PUSH (FOR BLOOD PRESSURE SUPPORT)
PREFILLED_SYRINGE | INTRAVENOUS | Status: DC | PRN
  Administered 2021-03-08 (×6): 80 ug via INTRAVENOUS

## 2021-03-08 MED ORDER — SUGAMMADEX SODIUM 200 MG/2ML IV SOLN
INTRAVENOUS | Status: DC | PRN
  Administered 2021-03-08: 200 mg via INTRAVENOUS

## 2021-03-08 MED ORDER — SODIUM CHLORIDE 0.9% FLUSH
3.0000 mL | Freq: Two times a day (BID) | INTRAVENOUS | Status: DC
  Administered 2021-03-08 – 2021-03-13 (×9): 3 mL via INTRAVENOUS

## 2021-03-08 MED ORDER — PHENYLEPHRINE HCL-NACL 10-0.9 MG/250ML-% IV SOLN
INTRAVENOUS | Status: DC | PRN
  Administered 2021-03-08: 70 ug/min via INTRAVENOUS
  Administered 2021-03-08: 25 ug/min via INTRAVENOUS

## 2021-03-08 MED ORDER — VANCOMYCIN HCL 1000 MG IV SOLR
INTRAVENOUS | Status: DC | PRN
  Administered 2021-03-08: 1000 mg via INTRAVENOUS

## 2021-03-08 MED ORDER — ONDANSETRON HCL 4 MG/2ML IJ SOLN
4.0000 mg | Freq: Four times a day (QID) | INTRAMUSCULAR | Status: DC | PRN
  Administered 2021-03-08 – 2021-03-15 (×15): 4 mg via INTRAVENOUS
  Filled 2021-03-08 (×15): qty 2

## 2021-03-08 MED ORDER — HEPARIN (PORCINE) 25000 UT/250ML-% IV SOLN
1300.0000 [IU]/h | INTRAVENOUS | Status: DC
  Administered 2021-03-08: 1300 [IU]/h via INTRAVENOUS
  Filled 2021-03-08 (×2): qty 250

## 2021-03-08 MED ORDER — PROTAMINE SULFATE 10 MG/ML IV SOLN
INTRAVENOUS | Status: DC | PRN
  Administered 2021-03-08: 30 mg via INTRAVENOUS

## 2021-03-08 MED ORDER — ETOMIDATE 2 MG/ML IV SOLN
INTRAVENOUS | Status: DC | PRN
  Administered 2021-03-08: 20 mg via INTRAVENOUS

## 2021-03-08 MED ORDER — WARFARIN SODIUM 5 MG PO TABS
5.0000 mg | ORAL_TABLET | Freq: Once | ORAL | Status: AC
  Administered 2021-03-08: 5 mg via ORAL
  Filled 2021-03-08: qty 1

## 2021-03-08 MED ORDER — SODIUM CHLORIDE 0.9 % IV SOLN: INTRAVENOUS | Status: DC

## 2021-03-08 MED ORDER — ACETAMINOPHEN 325 MG PO TABS: 650.0000 mg | ORAL_TABLET | ORAL | Status: DC | PRN

## 2021-03-08 MED ORDER — DEXAMETHASONE SODIUM PHOSPHATE 10 MG/ML IJ SOLN
INTRAMUSCULAR | Status: DC | PRN
  Administered 2021-03-08: 5 mg via INTRAVENOUS

## 2021-03-08 MED ORDER — SUCCINYLCHOLINE CHLORIDE 20 MG/ML IJ SOLN: INTRAMUSCULAR | Status: DC | PRN

## 2021-03-08 MED ORDER — CLOPIDOGREL BISULFATE 75 MG PO TABS
75.0000 mg | ORAL_TABLET | Freq: Every day | ORAL | Status: DC
  Administered 2021-03-09 – 2021-03-11 (×3): 75 mg via ORAL
  Filled 2021-03-08 (×3): qty 1

## 2021-03-08 MED ORDER — EPHEDRINE SULFATE-NACL 50-0.9 MG/10ML-% IV SOSY
PREFILLED_SYRINGE | INTRAVENOUS | Status: DC | PRN
  Administered 2021-03-08 (×3): 5 mg via INTRAVENOUS

## 2021-03-08 MED ORDER — LACTATED RINGERS IV SOLN: INTRAVENOUS | Status: DC | PRN

## 2021-03-08 MED ORDER — SODIUM CHLORIDE 0.9 % IV SOLN: 250.0000 mL | INTRAVENOUS | Status: DC | PRN

## 2021-03-08 MED ORDER — WARFARIN - PHARMACIST DOSING INPATIENT: Freq: Every day | Status: DC

## 2021-03-08 MED ORDER — MIDAZOLAM HCL 5 MG/5ML IJ SOLN
INTRAMUSCULAR | Status: DC | PRN
  Administered 2021-03-08 (×2): 1 mg via INTRAVENOUS

## 2021-03-08 MED ORDER — HEPARIN SODIUM (PORCINE) 1000 UNIT/ML IJ SOLN
INTRAMUSCULAR | Status: DC | PRN
  Administered 2021-03-08: 2000 [IU] via INTRAVENOUS

## 2021-03-08 MED ORDER — SODIUM CHLORIDE 0.9 % IV SOLN: INTRAVENOUS | Status: DC | PRN

## 2021-03-08 MED ORDER — HEPARIN SODIUM (PORCINE) 1000 UNIT/ML IJ SOLN
INTRAMUSCULAR | Status: DC | PRN
  Administered 2021-03-08 (×2): 3000 [IU] via INTRAVENOUS
  Administered 2021-03-08: 12000 [IU] via INTRAVENOUS

## 2021-03-08 MED ORDER — SODIUM CHLORIDE 0.9% FLUSH: 3.0000 mL | INTRAVENOUS | Status: DC | PRN

## 2021-03-08 MED ORDER — VANCOMYCIN HCL IN DEXTROSE 1-5 GM/200ML-% IV SOLN
INTRAVENOUS | Status: AC
  Filled 2021-03-08: qty 200

## 2021-03-08 MED ORDER — HEPARIN (PORCINE) IN NACL 1000-0.9 UT/500ML-% IV SOLN
INTRAVENOUS | Status: DC | PRN
  Administered 2021-03-08 (×3): 500 mL

## 2021-03-08 MED ORDER — FENTANYL CITRATE (PF) 250 MCG/5ML IJ SOLN
INTRAMUSCULAR | Status: DC | PRN
  Administered 2021-03-08 (×2): 50 ug via INTRAVENOUS

## 2021-03-08 MED ORDER — ONDANSETRON HCL 4 MG/2ML IJ SOLN
INTRAMUSCULAR | Status: DC | PRN
  Administered 2021-03-08: 4 mg via INTRAVENOUS

## 2021-03-08 MED ORDER — ROCURONIUM BROMIDE 10 MG/ML (PF) SYRINGE
PREFILLED_SYRINGE | INTRAVENOUS | Status: DC | PRN
  Administered 2021-03-08: 100 mg via INTRAVENOUS
  Administered 2021-03-08: 20 mg via INTRAVENOUS
  Administered 2021-03-08: 10 mg via INTRAVENOUS
  Administered 2021-03-08: 20 mg via INTRAVENOUS

## 2021-03-08 SURGICAL SUPPLY — 18 items
BLANKET WARM UNDERBOD FULL ACC (MISCELLANEOUS) ×2 IMPLANT
CATH 8FR REPROCESSED SOUNDSTAR (CATHETERS) ×2 IMPLANT
CATH JOSEPH QUAD ALLRED 6F REP (CATHETERS) ×4 IMPLANT
CATH MAPPNG PENTARAY F 2-6-2MM (CATHETERS) ×1 IMPLANT
CATH SMTCH THERMOCOOL SF DF (CATHETERS) ×2 IMPLANT
CLOSURE PERCLOSE PROSTYLE (VASCULAR PRODUCTS) ×8 IMPLANT
KIT MICROPUNCTURE NIT STIFF (SHEATH) ×2 IMPLANT
MAT PREVALON FULL STRYKER (MISCELLANEOUS) ×2 IMPLANT
PACK EP LATEX FREE (CUSTOM PROCEDURE TRAY) ×1
PACK EP LF (CUSTOM PROCEDURE TRAY) ×1 IMPLANT
PAD PRO RADIOLUCENT 2001M-C (PAD) ×2 IMPLANT
PENTARAY F 2-6-2MM (CATHETERS) ×2
SHEATH BRITE TIP 8FR 35CM (SHEATH) ×2 IMPLANT
SHEATH PINNACLE 6F 10CM (SHEATH) ×2 IMPLANT
SHEATH PINNACLE 8F 10CM (SHEATH) ×4 IMPLANT
SHEATH PINNACLE 9F 10CM (SHEATH) ×2 IMPLANT
TUBING SMART ABLATE COOLFLOW (TUBING) ×2 IMPLANT
WIRE HITORQ VERSACORE ST 145CM (WIRE) ×2 IMPLANT

## 2021-03-08 NOTE — Anesthesia Procedure Notes (Signed)
Procedure Name: Intubation Date/Time: 03/08/2021 12:24 PM Performed by: Lanell Matar, CRNA Pre-anesthesia Checklist: Patient identified, Emergency Drugs available, Suction available and Patient being monitored Patient Re-evaluated:Patient Re-evaluated prior to induction Oxygen Delivery Method: Circle System Utilized Preoxygenation: Pre-oxygenation with 100% oxygen Induction Type: IV induction Ventilation: Mask ventilation without difficulty Laryngoscope Size: Miller and 2 Grade View: Grade II Tube type: Oral Tube size: 7.0 mm Number of attempts: 1 Airway Equipment and Method: Stylet and Oral airway Placement Confirmation: ETT inserted through vocal cords under direct vision,  positive ETCO2 and breath sounds checked- equal and bilateral Secured at: 22 cm Tube secured with: Tape Dental Injury: Teeth and Oropharynx as per pre-operative assessment

## 2021-03-08 NOTE — Anesthesia Postprocedure Evaluation (Signed)
Anesthesia Post Note  Patient: BUSHRA DENMAN  Procedure(s) Performed: Floyce Stakes ABLATION (N/A )     Patient location during evaluation: Cath Lab Anesthesia Type: General Level of consciousness: awake Pain management: pain level controlled Vital Signs Assessment: post-procedure vital signs reviewed and stable Respiratory status: spontaneous breathing, nonlabored ventilation, respiratory function stable and patient connected to nasal cannula oxygen Cardiovascular status: blood pressure returned to baseline and stable Postop Assessment: no apparent nausea or vomiting Anesthetic complications: no   No complications documented.  Last Vitals:  Vitals:   03/08/21 1945 03/08/21 1947  BP: (!) 157/74   Pulse: 82   Resp: (!) 24   Temp:  36.7 C  SpO2: 95%     Last Pain:  Vitals:   03/08/21 1947  TempSrc: Oral  PainSc:                  Wesly Whisenant P Tyria Springer

## 2021-03-08 NOTE — Progress Notes (Addendum)
ANTICOAGULATION CONSULT NOTE - Follow Up Consult  Pharmacy Consult for IV Heparin + Warfarin Indication: persistent AFib, anticardiolipin syndrome  Allergies  Allergen Reactions  . Beef-Derived Products Anaphylaxis    From tick bite  . Metoclopramide Hcl Anaphylaxis    Non responsive  . Penicillins Anaphylaxis    Pt states she can take any cephalosporin (per pt. 01/01/19) Shock Has patient had a PCN reaction causing immediate rash, facial/tongue/throat swelling, SOB or lightheadedness with hypotension: Yes Has patient had a PCN reaction causing severe rash involving mucus membranes or skin necrosis: No Has patient had a PCN reaction that required hospitalization Yes Has patient had a PCN reaction occurring within the last 10 years: No If all of the above answers are "NO", then may proceed with Cephalosporin   . Pork-Derived Products Anaphylaxis    From tick bite. Has tolerated heparin/lovenox  . Metoprolol Other (See Comments)    Syncope   . Reglan [Metoclopramide]   . Statins Other (See Comments)    Myalgias    Patient Measurements: Height: 5\' 5"  (165.1 cm) Weight: 87.7 kg (193 lb 6.4 oz) IBW/kg (Calculated) : 57 Heparin dosing weight: 76 kg  Vital Signs: Temp: 98.6 F (37 C) (03/10 0855) Temp Source: Oral (03/10 0855) BP: 117/67 (03/10 0855) Pulse Rate: 62 (03/10 0855)  Labs: Recent Labs    03/06/21 0530 03/06/21 1221 03/06/21 1945 03/07/21 0101 03/08/21 0025  HGB 13.2  --   --  13.3 13.2  HCT 40.9  --   --  41.8 40.2  PLT 173  --   --  179 140*  LABPROT 15.0  --   --  14.5 13.5  INR 1.2  --   --  1.2 1.1  HEPARINUNFRC 0.24* 0.39 0.43 0.31  --   CREATININE 0.92  --   --  1.01* 0.94    Estimated Creatinine Clearance: 72.2 mL/min (by C-G formula based on SCr of 0.94 mg/dL).   Medical History: Past Medical History:  Diagnosis Date  . Aneurysm of internal carotid artery   . Anticardiolipin antibody positive    Chronic Coumadin  . Brain aneurysm     followed by Dr. 05/08/21  . Chronic combined systolic (congestive) and diastolic (congestive) heart failure (HCC)   . Cocaine abuse in remission (HCC)   . Coronary atherosclerosis of native coronary artery    Previous invasive cardiac testing was done at a facility in [place].  Occluded diagonal, Diffuse LAD disease, Occluded Cx, and non obstructive RCA.   . Essential hypertension   . Headache   . History of pneumonia   . ICD (implantable cardioverter-defibrillator) in place   . Ischemic cardiomyopathy    LVEF 30-35%  . Mixed hyperlipidemia   . NSVT (nonsustained ventricular tachycardia) (HCC)   . Persistent atrial fibrillation (HCC)   . PVD (peripheral vascular disease) (HCC)   . Raynaud's phenomenon   . Statin intolerance   . Stroke [facility]) 2007   Total occlusion of the right internal carotid    Assessment: 60 yr old female with history of persistent atrial fibrillation and anticardiolipin syndrome was on warfarin PTA (last dose taken 3/3 PM); INR was 2.1 on admission. After R/L cardiac cath on 3/7, pharmacy was asked to start heparin 8 hrs after sheath removal.   Heparin was turned off overnight for VT ablation this afternoon. INR remains subtherapeutic (1.1) after holding warfarin since 3/4. Hgb stable at 13.2, platelet count down 179>140. No overt bleeding or infusion issues noted.  Will plan to restart heparin infusion at prior rate (1300 units/hr) and warfarin tonight as long as no complications during VT ablation. Pharmacy was consulted to restart heparin infusion 6 hrs after sheath removal. Per cath lab RN, sheath was removed at 1450 PM today; pt has no bleeding issues post procedure.  Goal of Therapy:  INR 2-3 Heparin level 0.3-0.7 units/ml  Monitor platelets by anticoagulation protocol: Yes   Plan:  Restart IV heparin infusion at 1300 units/hr 6 hrs after sheath removal (restart at ~2100) Check heparin level 6 hrs after starting heparin infusion Give Warfarin  5 mg PO X 1 tonight Monitor daily heparin level, INR, CBC Monitor for bleeding  Vicki Mallet, PharmD, BCPS, [facility] Clinical Pharmacist 03/08/2021  3:56 PM

## 2021-03-08 NOTE — Progress Notes (Signed)
Electrophysiology Rounding Note  Patient Name: [name] [name] Date of Encounter: 03/08/2021  Primary Cardiologist: [name] Dell, MD Electrophysiologist: Lewayne Bunting, MD   Subjective   "Anxious, but ready to go"  Inpatient Medications    Scheduled Meds: . amiodarone  400 mg Oral BID  . carvedilol  6.25 mg Oral BID WC  . [START ON 03/09/2021] clopidogrel  75 mg Oral Q breakfast  . dapagliflozin propanediol  10 mg Oral Daily  . digoxin  0.125 mg Oral Daily  . ezetimibe  10 mg Oral Daily  . isosorbide mononitrate  30 mg Oral Daily  . melatonin  3 mg Oral QHS  . mexiletine  150 mg Oral Q8H  . ranolazine  500 mg Oral BID  . sacubitril-valsartan  1 tablet Oral BID  . sodium chloride flush  3 mL Intravenous Q12H  . sodium chloride flush  3 mL Intravenous Q12H  . spironolactone  25 mg Oral Daily  . torsemide  60 mg Oral Daily   Continuous Infusions: . sodium chloride    . sodium chloride 50 mL/hr at 03/08/21 0549   PRN Meds: sodium chloride, acetaminophen, magnesium hydroxide, nitroGLYCERIN, ondansetron (ZOFRAN) IV, oxyCODONE-acetaminophen **AND** oxyCODONE, prochlorperazine, sodium chloride flush, traZODone   Vital Signs    Vitals:   03/07/21 1935 03/07/21 2200 03/07/21 2321 03/08/21 0331  BP: 114/61  105/60 (!) 112/54  Pulse: 60 62 (!) 57 64  Resp: 16 20 14 20   Temp: 97.6 F (36.4 C)  97.9 F (36.6 C) 98.7 F (37.1 C)  TempSrc: Oral  Oral Oral  SpO2: 95% 98% 95% 97%  Weight:      Height:        Intake/Output Summary (Last 24 hours) at 03/08/2021 0744 Last data filed at 03/08/2021 0100 Gross per 24 hour  Intake 1641.76 ml  Output 1400 ml  Net 241.76 ml   Filed Weights   03/01/21 2051 03/02/21 1241 03/05/21 0418  Weight: 86.2 kg 87.2 kg 87.7 kg    Physical Exam    GEN- The patient is well appearing, alert and oriented x 3 today.   Head- normocephalic, atraumatic Eyes-  Sclera clear, conjunctiva pink Ears- hearing intact Oropharynx- clear Neck-  supple Lungs- Clear to ausculation bilaterally, normal work of breathing Heart- Regular rate and rhythm, no murmurs, rubs or gallops GI- soft, NT, ND, + BS Extremities- no clubbing or cyanosis. No edema Skin- no rash or lesion Psych- euthymic mood, full affect Neuro- strength and sensation are intact  Labs    CBC Recent Labs    03/07/21 0101 03/08/21 0025  WBC 7.4 8.1  HGB 13.3 13.2  HCT 41.8 40.2  MCV 92.9 90.3  PLT 179 140*   Basic Metabolic Panel Recent Labs    05/08/21 0101 03/08/21 0025  NA 136 134*  K 4.2 4.0  CL 97* 95*  CO2 27 29  GLUCOSE 87 120*  BUN 19 15  CREATININE 1.01* 0.94  CALCIUM 8.9 8.6*   Liver Function Tests No results for input(s): AST, ALT, ALKPHOS, BILITOT, PROT, ALBUMIN in the last 72 hours. No results for input(s): LIPASE, AMYLASE in the last 72 hours. Cardiac Enzymes No results for input(s): CKTOTAL, CKMB, CKMBINDEX, TROPONINI in the last 72 hours.   Telemetry    NSR50-60s (personally reviewed)  Radiology    No results found.  Patient Profile     [name] Carnero Hooveris a 60 y.o.femalewith a history of of CAD (PCI of LCx in Jan 2022), ICM (s/pBoston Scientific dual  chamber ICD implant 2017), cocaine abuse (in remission), CVA, AFIB, antiphospholipid syndrome (on Coumadin), who presented to the hospital for worsening SOB and 4 shocks from her ICDwho is being seen today for the evaluation of recurrent VTat the request of Dr. Shirlee Latch.  Assessment & Plan    1.Recurrent VT Had been quiescent since January with revascularization and amio. Presented with ICD shock x 4. Shocks were ineffective. May be revisited pending results of ablation. Continue amio 400 mg BID Continue mexitil Maximize anti-ischemic therapies(imdur recently added) Will continue AADs. Per Dr. Lalla Brothers, will perform scar modification/ablation today even if VT cannot be induced in lab.  2.Chronic systolic dysfunction s/p Boston Scientificdual chamber ICD NYHA  III symptoms  Low filling pressures on cath3/7/22 On an appropriate medical regimen She has been eating multiple high sodium meals this admisison ([employer] for lunch, [employer] for dinner 3/7) Has been counseled on salt and fluid restrictions.  3. PVCs Has previously been described as a poorablation candidate with multiple morphologies.  Continue as above  4. Anti-phospholipid syndrome Heparin on hold with ablation this am.  Plan to resume heparin 6+ hrs post op, and start coumadin tonight if no issues.    For questions or updates, please contact [facility] Please consult [email or web address] for contact info under Cardiology/STEMI.  Signed, Graciella Freer, PA-C  03/08/2021, 7:44 AM

## 2021-03-08 NOTE — Anesthesia Preprocedure Evaluation (Addendum)
Anesthesia Evaluation  Patient identified by MRN, date of birth, ID band Patient awake    Reviewed: Allergy & Precautions, NPO status , Patient's Chart, lab work & pertinent test results  History of Anesthesia Complications Negative for: history of anesthetic complications  Airway Mallampati: II  TM Distance: >3 FB Neck ROM: Full    Dental  (+) Poor Dentition, Dental Advisory Given   Pulmonary Current SmokerPatient did not abstain from smoking.,    Pulmonary exam normal        Cardiovascular hypertension, Pt. on medications and Pt. on home beta blockers (-) angina+ CAD, + Past MI, + Cardiac Stents and + Peripheral Vascular Disease  Normal cardiovascular exam+ dysrhythmias Ventricular Tachycardia + Cardiac Defibrillator   07/2020 ECHO:  more prominent global LV hypokinesis and at least the anteroseptal wall appears akinetic, EF  15-20%. LV severely decreased function with global hypokinesis, mild MR   Neuro/Psych CVA, No Residual Symptoms    GI/Hepatic GERD  Medicated and Controlled,(+)     substance abuse  ,   Endo/Other  Morbid obesity  Renal/GU negative Renal ROS     Musculoskeletal   Abdominal (+) - obese,   Peds  Hematology antiphospholipid syndrome  on coumadin   Anesthesia Other Findings   Reproductive/Obstetrics                            Anesthesia Physical  Anesthesia Plan  ASA: IV  Anesthesia Plan: General   Post-op Pain Management:    Induction: Intravenous  PONV Risk Score and Plan: 2 and Ondansetron and Treatment may vary due to age or medical condition  Airway Management Planned: Oral ETT  Additional Equipment: Arterial line  Intra-op Plan:   Post-operative Plan: Extubation in OR  Informed Consent: I have reviewed the patients History and Physical, chart, labs and discussed the procedure including the risks, benefits and alternatives for the proposed anesthesia  with the patient or authorized representative who has indicated his/her understanding and acceptance.     Dental advisory given  Plan Discussed with: Anesthesiologist and CRNA  Anesthesia Plan Comments:        Anesthesia Quick Evaluation

## 2021-03-08 NOTE — Transfer of Care (Signed)
Immediate Anesthesia Transfer of Care Note  Patient: [name] [name]  Procedure(s) Performed: Floyce Stakes ABLATION (N/A )  Patient Location: PACU and Cath Lab  Anesthesia Type:General  Level of Consciousness: awake, alert  and patient cooperative  Airway & Oxygen Therapy: Patient Spontanous Breathing and Patient connected to nasal cannula oxygen  Post-op Assessment: Report given to RN and Post -op Vital signs reviewed and stable  Post vital signs: Reviewed and stable  Last Vitals:  Vitals Value Taken Time  BP 129/59 03/08/21 1556  Temp    Pulse 64 03/08/21 1558  Resp 12 03/08/21 1558  SpO2 99 % 03/08/21 1558  Vitals shown include unvalidated device data.  Last Pain:  Vitals:   03/08/21 0855  TempSrc: Oral  PainSc: 0-No pain      Patients Stated Pain Goal: 3 (03/07/21 2200)  Complications: No complications documented.

## 2021-03-08 NOTE — Progress Notes (Signed)
ANTICOAGULATION CONSULT NOTE   Pharmacy Consult for Heparin+Warfarin Indication: persistent AFib, anticardiolipin syndrome  Allergies  Allergen Reactions   Beef-Derived Products Anaphylaxis    From tick bite   Metoclopramide Hcl Anaphylaxis    Non responsive   Penicillins Anaphylaxis    Pt states she can take any cephalosporin (per pt. 01/01/19) Shock Has patient had a PCN reaction causing immediate rash, facial/tongue/throat swelling, SOB or lightheadedness with hypotension: Yes Has patient had a PCN reaction causing severe rash involving mucus membranes or skin necrosis: No Has patient had a PCN reaction that required hospitalization Yes Has patient had a PCN reaction occurring within the last 10 years: No If all of the above answers are "NO", then may proceed with Cephalosporin    Pork-Derived Products Anaphylaxis    From tick bite. Has tolerated heparin/lovenox   Metoprolol Other (See Comments)    Syncope    Reglan [Metoclopramide]    Statins Other (See Comments)    Myalgias    Patient Measurements: Height: 5\' 5"  (165.1 cm) Weight: 87.7 kg (193 lb 6.4 oz) IBW/kg (Calculated) : 57 Heparin dosing weight: 76 kg  Vital Signs: Temp: 98.6 F (37 C) (03/10 0855) Temp Source: Oral (03/10 0855) BP: 117/67 (03/10 0855) Pulse Rate: 62 (03/10 0855)  Labs: Recent Labs    03/06/21 0530 03/06/21 1221 03/06/21 1945 03/07/21 0101 03/08/21 0025  HGB 13.2  --   --  13.3 13.2  HCT 40.9  --   --  41.8 40.2  PLT 173  --   --  179 140*  LABPROT 15.0  --   --  14.5 13.5  INR 1.2  --   --  1.2 1.1  HEPARINUNFRC 0.24* 0.39 0.43 0.31  --   CREATININE 0.92  --   --  1.01* 0.94    Estimated Creatinine Clearance: 72.2 mL/min (by C-G formula based on SCr of 0.94 mg/dL).   Medical History: Past Medical History:  Diagnosis Date   Aneurysm of internal carotid artery    Anticardiolipin antibody positive    Chronic Coumadin   Brain aneurysm    followed by Dr. 05/08/21    Chronic combined systolic (congestive) and diastolic (congestive) heart failure (HCC)    Cocaine abuse in remission [facility])    Coronary atherosclerosis of native coronary artery    Previous invasive cardiac testing was done at a facility in [place], [place].  Occluded diagonal, Diffuse LAD disease, Occluded Cx, and non obstructive RCA.    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 Last dose taken 3/3 PM. INR 2.1 on admit. S/p [facility], pharmacy asked to start heparin 8 hrs after sheath removal.   Heparin turned off overnight for VT ablation this afternoon. INR remains subtherapeutic after holding warfarin since 3/4. Hgb stable at 13.2, pltc down 179>140. No overt bleeding or infusion issues noted.   Will plan to restart heparin gtt at prior rate and PTA warfarin regimen tonight as long as  no complications during VT ablation.  Goal of Therapy:  INR 2-3 Heparin level 0.3-0.7 units/ml  Monitor platelets by anticoagulation protocol: Yes   Plan:  Will check with MD/patient post-procedure As long as no complications during VT ablation will plan to: - Restart IV heparin at 1300 units/hr 6 hrs post-ablation - Give Warfarin 5 mg tonight Check 6 hr HL after starting heparin Monitor daily HL, INR, CBC, s/sx bleeding  Tama Headings, PharmD, BCPS PGY2 Cardiology Pharmacy Resident Phone: [phone] 03/08/2021  12:04 PM  Please check [email or web address] for unit-specific pharmacy phone  numbers.

## 2021-03-08 NOTE — Progress Notes (Signed)
Patient ID: [name] [name], female   DOB: [birth year], 60 y.o.   MRN: [id number]     Advanced Heart Failure Rounding Note  PCP-Cardiologist: Nona Dell, MD   Subjective:    - Admitted  for ICD shocks x 4 for VT. - 3/7 had RHC/LHC with stable coronaries, low filling pressures, and preserved cardiac output. Post cath torsemide cut back.   No dyspnea, no complaints.    Objective:   Weight Range: 87.7 kg Body mass index is 32.18 kg/m.   Vital Signs:   Temp:  [97.6 F (36.4 C)-98.7 F (37.1 C)] 98.7 F (37.1 C) (03/10 0331) Pulse Rate:  [57-66] 64 (03/10 0331) Resp:  [14-20] 20 (03/10 0331) BP: (105-114)/(54-62) 112/54 (03/10 0331) SpO2:  [95 %-98 %] 97 % (03/10 0331) Last BM Date: 03/05/21  Weight change: Filed Weights   03/01/21 2051 03/02/21 1241 03/05/21 0418  Weight: 86.2 kg 87.2 kg 87.7 kg    Intake/Output:   Intake/Output Summary (Last 24 hours) at 03/08/2021 0803 Last data filed at 03/08/2021 0100 Gross per 24 hour  Intake 1225.04 ml  Output 1400 ml  Net -174.96 ml      Physical Exam   General: NAD Neck: No JVD, no thyromegaly or thyroid nodule.  Lungs: Clear to auscultation bilaterally with normal respiratory effort. CV: Nondisplaced PMI.  Heart regular S1/S2, no S3/S4, no murmur.  No peripheral edema.  No carotid bruit.  Normal pedal pulses.  Abdomen: Soft, nontender, no hepatosplenomegaly, no distention.  Skin: Intact without lesions or rashes.  Neurologic: Alert and oriented x 3.  Psych: Normal affect. Extremities: No clubbing or cyanosis.  HEENT: Normal.    Telemetry   NSR no VT over night (personally reviewed).   Labs    CBC Recent Labs    03/07/21 0101 03/08/21 0025  WBC 7.4 8.1  HGB 13.3 13.2  HCT 41.8 40.2  MCV 92.9 90.3  PLT 179 140*   Basic Metabolic Panel Recent Labs    97/02/63 0101 03/08/21 0025  NA 136 134*  K 4.2 4.0  CL 97* 95*  CO2 27 29  GLUCOSE 87 120*  BUN 19 15  CREATININE 1.01* 0.94  CALCIUM 8.9 8.6*    Liver Function Tests No results for input(s): AST, ALT, ALKPHOS, BILITOT, PROT, ALBUMIN in the last 72 hours. No results for input(s): LIPASE, AMYLASE in the last 72 hours. Cardiac Enzymes No results for input(s): CKTOTAL, CKMB, CKMBINDEX, TROPONINI in the last 72 hours.  BNP: BNP (last 3 results) Recent Labs    12/14/20 1921 02/27/21 1605 03/01/21 2125  BNP 209.0* 320.5* 194.0*    ProBNP (last 3 results) No results for input(s): PROBNP in the last 8760 hours.   D-Dimer No results for input(s): DDIMER in the last 72 hours. Hemoglobin A1C No results for input(s): HGBA1C in the last 72 hours. Fasting Lipid Panel No results for input(s): CHOL, HDL, LDLCALC, TRIG, CHOLHDL, LDLDIRECT in the last 72 hours. Thyroid Function Tests No results for input(s): TSH, T4TOTAL, T3FREE, THYROIDAB in the last 72 hours.  Invalid input(s): FREET3  Other results:   Imaging    No results found.   Medications:     Scheduled Medications: . amiodarone  400 mg Oral BID  . carvedilol  6.25 mg Oral BID WC  . [START ON 03/09/2021] clopidogrel  75 mg Oral Q breakfast  . dapagliflozin propanediol  10 mg Oral Daily  . digoxin  0.125 mg Oral Daily  . ezetimibe  10 mg Oral Daily  .  isosorbide mononitrate  30 mg Oral Daily  . melatonin  3 mg Oral QHS  . mexiletine  150 mg Oral Q8H  . ranolazine  500 mg Oral BID  . sacubitril-valsartan  1 tablet Oral BID  . sodium chloride flush  3 mL Intravenous Q12H  . sodium chloride flush  3 mL Intravenous Q12H  . spironolactone  25 mg Oral Daily  . torsemide  60 mg Oral Daily    Infusions: . sodium chloride    . sodium chloride 50 mL/hr at 03/08/21 0549    PRN Medications: sodium chloride, acetaminophen, magnesium hydroxide, nitroGLYCERIN, ondansetron (ZOFRAN) IV, oxyCODONE-acetaminophen **AND** oxyCODONE, prochlorperazine, sodium chloride flush, traZODone   Assessment/Plan   1. VT: Suspect scar-mediated VT, no new coronary disease on  angiography this admission.  Prior VT in 1/22.  Had PCI to LCx in 1/22. HS-TnI up to 3070 but appears to be demand ischemia with VT and shocks x 4 rather than true ACS as no new disease on coronary angiography. Shocks by ICD were ineffective.  - Continue  amiodarone 400 mg bid.  - Continue mexiletine.   - Plan for VT ablation today. After this, will need ICD system revision given ineffective shocks.  2. CAD: MI in 2008 in [facility], cath at the time showed totally occluded diagonal, totally occluded LCX, and diffuse LAD disease.  No intervention. LHC was done in 12/21 due to VT, this showed chronically occluded D1 and 95% proximal LCx.  Initially, no PCI due to thrombocytopenia.  She eventually had PCI to proximal LCx in 1/22.  Elevated troponin appears to be demand ischemic as no new CAD noted on coronary angiography this admission.  No chest pain. - Continue Zetia, has not tolerated statins.  Good lipids in 12/21.   - Continue Plavix 75 mg daily x at least 6 months post PCI and ideally 1 year. - She is off ASA given use of warfarin and Plavix.  3. Atrial fibrillation: TEE-guided DCCV to NSR in 12/21.  She is in NSR. No breakthrough Afib on device interrogation.  - continue amiodarone  - If atrial fibrillation recurs, will need to consider Tikosyn versus ablation.  - Coumadin on hold for procedures, continue IV heparin gtt. Can start back on coumadin evening of 3/10 after VT ablation (also heparin gtt).   4. Carotid stenosis: Known RICA occlusion. 03/05/21 dopplers with TO RICA, 1-39% LICA.   4. 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  - She is on chronic coumadin, now on hold for procedures.  Continue heparin gtt.  6. Chronic systolic CHF: Ischemic cardiomyopathy.  [employer] ICD.  Echo (8/21) with EF 15-20%, anteroseptal AK, RV normal, severe LAE, mild MR.  TEE (12/21) with EF 25-30%.  RHC in 12/21 with preserved cardiac output and optimized filling pressures.  NYHA class II  symptoms, not volume overloaded on exam.  RHC showed preserved cardiac output and low filling pressures.  Volume status stable.  - Continue Entresto 24-26 bid  - Continue Farxiga 10 mg daily.   - Continue spironolactone 25 mg daily.  - Continue Coreg 6.25 mg bid.  - Continue torsemide to 60 mg daily  - Continue digoxin, level ok.  7. Smoking: Active smoker, wants to stop on her own.  8. PAD: She denies claudication.  - Needs to quit smoking.  9. 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  Platelets a little lower at 140 this morning.  - She will see Dr. Truett Perna (hematology).  10. Mitral regurgitation: Moderate on  12/21 TEE, suspect functional.   Length of Stay: 6  [name], MD  03/08/2021, 8:03 AM  Advanced Heart Failure Team Pager [phone] (M-F; 7a - 5p)  Please contact [facility] for night-coverage after hours (4p -7a ) and weekends on [email or web address]

## 2021-03-08 NOTE — Plan of Care (Signed)

## 2021-03-09 ENCOUNTER — Ambulatory Visit: Payer: Medicare PPO | Admitting: Cardiology

## 2021-03-09 ENCOUNTER — Encounter (HOSPITAL_COMMUNITY): Payer: Self-pay | Admitting: Cardiology

## 2021-03-09 ENCOUNTER — Telehealth: Payer: Self-pay | Admitting: *Deleted

## 2021-03-09 DIAGNOSIS — I5043 Acute on chronic combined systolic (congestive) and diastolic (congestive) heart failure: Secondary | ICD-10-CM

## 2021-03-09 LAB — BASIC METABOLIC PANEL
Anion gap: 7 (ref 5–15)
BUN: 17 mg/dL (ref 6–20)
CO2: 26 mmol/L (ref 22–32)
Calcium: 8.7 mg/dL — ABNORMAL LOW (ref 8.9–10.3)
Chloride: 104 mmol/L (ref 98–111)
Creatinine, Ser: 0.84 mg/dL (ref 0.44–1.00)
GFR, Estimated: >60 mL/min (ref >60)
Glucose, Bld: 132 mg/dL — ABNORMAL HIGH (ref 70–99)
Potassium: 4.2 mmol/L (ref 3.5–5.1)
Sodium: 137 mmol/L (ref 135–145)

## 2021-03-09 LAB — CBC
HCT: 40.4 % (ref 36.0–46.0)
Hemoglobin: 13.2 g/dL (ref 12.0–15.0)
MCH: 30.1 pg (ref 26.0–34.0)
MCHC: 32.7 g/dL (ref 30.0–36.0)
MCV: 92 fL (ref 80.0–100.0)
Platelets: 96 10*3/uL — ABNORMAL LOW (ref 150–400)
RBC: 4.39 MIL/uL (ref 3.87–5.11)
RDW: 13 % (ref 11.5–15.5)
WBC: 12.5 10*3/uL — ABNORMAL HIGH (ref 4.0–10.5)
nRBC: 0 % (ref 0.0–0.2)

## 2021-03-09 LAB — PROTIME-INR
INR: 1 (ref 0.8–1.2)
Prothrombin Time: 13.2 seconds (ref 11.4–15.2)

## 2021-03-09 LAB — APTT
aPTT: 63 seconds — ABNORMAL HIGH (ref 24–36)
aPTT: 50 seconds — ABNORMAL HIGH (ref 24–36)

## 2021-03-09 LAB — HEPARIN LEVEL (UNFRACTIONATED): Heparin Unfractionated: 0.34 IU/mL (ref 0.30–0.70)

## 2021-03-09 MED ORDER — WARFARIN SODIUM 5 MG PO TABS: 5.0000 mg | ORAL_TABLET | Freq: Once | ORAL | Status: DC

## 2021-03-09 MED ORDER — WARFARIN SODIUM 10 MG PO TABS
10.0000 mg | ORAL_TABLET | Freq: Once | ORAL | Status: AC
  Administered 2021-03-09: 10 mg via ORAL
  Filled 2021-03-09: qty 1

## 2021-03-09 MED ORDER — POTASSIUM CHLORIDE CRYS ER 20 MEQ PO TBCR
40.0000 meq | EXTENDED_RELEASE_TABLET | Freq: Once | ORAL | Status: AC
  Administered 2021-03-09: 40 meq via ORAL
  Filled 2021-03-09: qty 2

## 2021-03-09 MED ORDER — TRAZODONE HCL 100 MG PO TABS
100.0000 mg | ORAL_TABLET | Freq: Every evening | ORAL | Status: DC | PRN
  Administered 2021-03-09 – 2021-03-14 (×3): 100 mg via ORAL
  Filled 2021-03-09: qty 1
  Filled 2021-03-09 (×3): qty 2

## 2021-03-09 MED ORDER — SODIUM CHLORIDE 0.9 % IV SOLN
0.2000 mg/kg/h | INTRAVENOUS | Status: DC
  Administered 2021-03-09: 0.15 mg/kg/h via INTRAVENOUS
  Administered 2021-03-10 (×2): 0.2 mg/kg/h via INTRAVENOUS
  Filled 2021-03-09 (×5): qty 250

## 2021-03-09 MED ORDER — FUROSEMIDE 10 MG/ML IJ SOLN
60.0000 mg | Freq: Two times a day (BID) | INTRAMUSCULAR | Status: AC
  Administered 2021-03-09 (×2): 60 mg via INTRAVENOUS
  Filled 2021-03-09 (×2): qty 6

## 2021-03-09 MED FILL — Heparin Sod (Porcine)-NaCl IV Soln 1000 Unit/500ML-0.9%: INTRAVENOUS | Qty: 500 | Status: AC

## 2021-03-09 NOTE — Progress Notes (Signed)
[id number] Was coming to offer to walk. Pt stated she has been walking to bathroom but not up to walking at this time. Pt stated she was having back pain across middle of back and had received Imdur. She knows if she feels she needs a NTG tablet or if pain worsens to let her RN know. RN was in room when I arrived so aware of back discomfort. Pt aware of importance of daily weights, could recite how to take NTG at home, aware of need to watch sodium. Gave sodium diets and discussed 2000 mg restriction. Given smoking cessation handout. Pt stated she quit about 1/12 weeks before admission and does not plan on restarting. Pt has been referred to [facility] on other admission. Pt stated she called them today and spoke with Director to let know in hospital. Pt stated CRP has been in touch with her and she will follow up after recovery. Will continue to follow pt. Luetta Nutting RN BSN 03/09/2021 11:31 AM

## 2021-03-09 NOTE — Progress Notes (Addendum)
Patient ID: [name] [name], female   DOB: [birth year], 60 y.o.   MRN: [id number]     Advanced Heart Failure Rounding Note  PCP-Cardiologist: Nona Dell, MD   Subjective:    - Admitted  for ICD shocks x 4 for VT. - 3/7 had RHC/LHC with stable coronaries, low filling pressures, and preserved cardiac output. Post cath torsemide cut back. - 3/10 S/P VT ablation   Feeling ok.    Objective:   Weight Range: 87.7 kg Body mass index is 32.18 kg/m.   Vital Signs:   Temp:  [97.2 F (36.2 C)-98.6 F (37 C)] 97.7 F (36.5 C) (03/11 0555) Pulse Rate:  [61-82] 82 (03/10 2255) Resp:  [15-24] 15 (03/11 0555) BP: (117-157)/(41-74) 133/46 (03/11 0555) SpO2:  [94 %-98 %] 98 % (03/11 0555) Last BM Date: 03/05/21  Weight change: Filed Weights   03/01/21 2051 03/02/21 1241 03/05/21 0418  Weight: 86.2 kg 87.2 kg 87.7 kg    Intake/Output:   Intake/Output Summary (Last 24 hours) at 03/09/2021 0746 Last data filed at 03/09/2021 0225 Gross per 24 hour  Intake 1500 ml  Output 3430 ml  Net -1930 ml      Physical Exam  General:  No resp difficulty HEENT: normal Neck: supple. JVP 10-11. Carotids 2+ bilat; no bruits. No lymphadenopathy or thryomegaly appreciated. Cor: PMI nondisplaced. Regular rate & rhythm. No rubs, gallops or murmurs. Lungs: clear Abdomen: soft, nontender, nondistended. No hepatosplenomegaly. No bruits or masses. Good bowel sounds. Extremities: no cyanosis, clubbing, rash, edema Neuro: alert & orientedx3, cranial nerves grossly intact. moves all 4 extremities w/o difficulty. Affect pleasant  Telemetry   NSR no VT over night.   Labs    CBC Recent Labs    03/08/21 0025 03/09/21 0333  WBC 8.1 12.5*  HGB 13.2 13.2  HCT 40.2 40.4  MCV 90.3 92.0  PLT 140* 96*   Basic Metabolic Panel Recent Labs    54/65/68 0025 03/09/21 0333  NA 134* 137  K 4.0 4.2  CL 95* 104  CO2 29 26  GLUCOSE 120* 132*  BUN 15 17  CREATININE 0.94 0.84  CALCIUM 8.6* 8.7*    Liver Function Tests No results for input(s): AST, ALT, ALKPHOS, BILITOT, PROT, ALBUMIN in the last 72 hours. No results for input(s): LIPASE, AMYLASE in the last 72 hours. Cardiac Enzymes No results for input(s): CKTOTAL, CKMB, CKMBINDEX, TROPONINI in the last 72 hours.  BNP: BNP (last 3 results) Recent Labs    12/14/20 1921 02/27/21 1605 03/01/21 2125  BNP 209.0* 320.5* 194.0*    ProBNP (last 3 results) No results for input(s): PROBNP in the last 8760 hours.   D-Dimer No results for input(s): DDIMER in the last 72 hours. Hemoglobin A1C No results for input(s): HGBA1C in the last 72 hours. Fasting Lipid Panel No results for input(s): CHOL, HDL, LDLCALC, TRIG, CHOLHDL, LDLDIRECT in the last 72 hours. Thyroid Function Tests No results for input(s): TSH, T4TOTAL, T3FREE, THYROIDAB in the last 72 hours.  Invalid input(s): FREET3  Other results:   Imaging    No results found.   Medications:     Scheduled Medications: . amiodarone  400 mg Oral BID  . carvedilol  6.25 mg Oral BID WC  . clopidogrel  75 mg Oral Q breakfast  . dapagliflozin propanediol  10 mg Oral Daily  . digoxin  0.125 mg Oral Daily  . ezetimibe  10 mg Oral Daily  . isosorbide mononitrate  30 mg Oral Daily  . melatonin  3 mg Oral QHS  . mexiletine  150 mg Oral Q8H  . ranolazine  500 mg Oral BID  . sacubitril-valsartan  1 tablet Oral BID  . sodium chloride flush  3 mL Intravenous Q12H  . sodium chloride flush  3 mL Intravenous Q12H  . sodium chloride flush  3 mL Intravenous Q12H  . spironolactone  25 mg Oral Daily  . torsemide  60 mg Oral Daily  . Warfarin - Pharmacist Dosing Inpatient   Does not apply q1600    Infusions: . sodium chloride    . sodium chloride 50 mL/hr at 03/08/21 0549  . sodium chloride    . heparin 1,300 Units/hr (03/08/21 2132)    PRN Medications: sodium chloride, sodium chloride, acetaminophen, magnesium hydroxide, nitroGLYCERIN, ondansetron (ZOFRAN) IV,  oxyCODONE-acetaminophen **AND** oxyCODONE, prochlorperazine, sodium chloride flush, sodium chloride flush, traZODone   Assessment/Plan   1. VT: Suspect scar-mediated VT, no new coronary disease on angiography this admission.  Prior VT in 1/22.  Had PCI to LCx in 1/22. HS-TnI up to 3070 but appears to be demand ischemia with VT and shocks x 4 rather than true ACS as no new disease on coronary angiography. Shocks by ICD were ineffective.  - S/P VT ablation.  - Continue  amiodarone 400 mg bid.  - Continue mexiletine.   - After this, will need ICD system revision given ineffective shocks.  2. CAD: MI in 2008 in [facility], cath at the time showed totally occluded diagonal, totally occluded LCX, and diffuse LAD disease.  No intervention. LHC was done in 12/21 due to VT, this showed chronically occluded D1 and 95% proximal LCx.  Initially, no PCI due to thrombocytopenia.  She eventually had PCI to proximal LCx in 1/22.  Elevated troponin appears to be demand ischemic as no new CAD noted on coronary angiography this admission.  No chest pain. - Continue Zetia, has not tolerated statins.  Good lipids in 12/21.   - No chest pain.  - Continue Plavix 75 mg daily x at least 6 months post PCI and ideally 1 year. - She is off ASA given use of warfarin and Plavix.  3. Atrial fibrillation: TEE-guided DCCV to NSR in 12/21.  She is in NSR. No breakthrough Afib on device interrogation.  - continue amiodarone  - If atrial fibrillation recurs, will need to consider Tikosyn versus ablation.  - Continue IV heparin gtt and coumadin. .   4. Carotid stenosis: Known RICA occlusion. 03/05/21 dopplers with TO RICA, 1-39% LICA.   4. 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  - She is on chronic coumadin, now on hold for procedures.  Continue heparin gtt.  6. Chronic systolic CHF: Ischemic cardiomyopathy.  [employer] ICD.  Echo (8/21) with EF 15-20%, anteroseptal AK, RV normal, severe LAE, mild MR.  TEE (12/21)  with EF 25-30%.  RHC in 12/21 with preserved cardiac output and optimized filling pressures.  NYHA class II symptoms, not volume overloaded on exam.  RHC showed preserved cardiac output and low filling pressures.  - Volume status trending up. Stop torsemide. Start lasix IV 60 mg bid.  - Continue Entresto 24-26 bid  - Continue Farxiga 10 mg daily.   - Continue spironolactone 25 mg daily.  - Continue Coreg 6.25 mg bid.  - Continue torsemide to 60 mg daily  - Continue digoxin, level ok.  - Check BMET in am. Labs ordered.  7. Smoking: Active smoker, wants to stop on her own.  8. PAD: She denies claudication.  -  Needs to quit smoking.  9. 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  Plts have dropped in hospital in past, not thought to be HIT.  Platelets trending down at 96K this morning.  - She will see Dr. Truett Perna (hematology).  10. Mitral regurgitation: Moderate on 12/21 TEE, suspect functional.   Length of Stay: 7  Amy Clegg, NP  03/09/2021, 7:46 AM  Advanced Heart Failure Team Pager [phone] (M-F; 7a - 5p)  Please contact [facility] for night-coverage after hours (4p -7a ) and weekends on [email or web address]  Patient seen with NP, agree with the above note.   She had VT ablation yesterday.  No arrhythmias overnight.  She got a fair amount of IV fluid yesterday, now with shortness of breath.   General: NAD Neck: JVP 10 cm, no thyromegaly or thyroid nodule.  Lungs: Clear to auscultation bilaterally with normal respiratory effort. CV: Nondisplaced PMI.  Heart regular S1/S2, no S3/S4, no murmur.  No peripheral edema.   Abdomen: Soft, nontender, no hepatosplenomegaly, no distention.  Skin: Intact without lesions or rashes.  Neurologic: Alert and oriented x 3.  Psych: Normal affect. Extremities: No clubbing or cyanosis.  HEENT: Normal.   Stop torsemide, start Lasix 60 mg IV bid today.  Possibly back to torsemide 60 mg daily tomorrow depending on how she looks on exam.  Continue  other HF meds.   She will need DFTs and possible ICD revision with Dr. Lalla Brothers on Monday, will stay through weekend.    She will continue warfarin.  She is on heparin bridge but plts are falling again (down to 96K).  She has dropped in the hospital acutely in the past, thought to be due to ITP with drop in plts triggered by stress/inflammation.  Has not thought to be HIT in the past.  To be safe, will transition from heparin gtt to bivalirudin gtt as bridge.   [name] 03/09/2021 10:57 AM

## 2021-03-09 NOTE — Telephone Encounter (Signed)
Called to cancel her appointment on 03/13/21. She is in the hospital at [facility] since 03/02/21. Asking if Dr. Truett Perna will order the labs he wanted to do on that day? She has had an ablation and is having issues with her defibrillator as well.  MD notified. Per RN assessement: looks like they have been done today, except for the Beta 2 glycoprotein.

## 2021-03-09 NOTE — Progress Notes (Signed)
ANTICOAGULATION CONSULT NOTE   Pharmacy Consult for Bivalirudin Indication: persistent AFib, anticardiolipin syndrome  Allergies  Allergen Reactions  . Beef-Derived Products Anaphylaxis    From tick bite  . Metoclopramide Hcl Anaphylaxis    Non responsive  . Penicillins Anaphylaxis    Pt states she can take any cephalosporin (per pt. 01/01/19) Shock Has patient had a PCN reaction causing immediate rash, facial/tongue/throat swelling, SOB or lightheadedness with hypotension: Yes Has patient had a PCN reaction causing severe rash involving mucus membranes or skin necrosis: No Has patient had a PCN reaction that required hospitalization Yes Has patient had a PCN reaction occurring within the last 10 years: No If all of the above answers are "NO", then may proceed with Cephalosporin   . Pork-Derived Products Anaphylaxis    From tick bite. Has tolerated heparin/lovenox  . Metoprolol Other (See Comments)    Syncope   . Reglan [Metoclopramide]   . Statins Other (See Comments)    Myalgias    Patient Measurements: Height: 5\' 5"  (165.1 cm) Weight: 87.7 kg (193 lb 6.4 oz) IBW/kg (Calculated) : 57 Heparin dosing weight: 76 kg  Vital Signs: BP: 118/63 (03/11 1109)  Labs: Recent Labs    03/07/21 0101 03/08/21 0025 03/09/21 0333 03/09/21 1608 03/09/21 2050  HGB 13.3 13.2 13.2  --   --   HCT 41.8 40.2 40.4  --   --   PLT 179 140* 96*  --   --   APTT  --   --   --  63* 50*  LABPROT 14.5 13.5 13.2  --   --   INR 1.2 1.1 1.0  --   --   HEPARINUNFRC 0.31  --  0.34  --   --   CREATININE 1.01* 0.94 0.84  --   --     Estimated Creatinine Clearance: 80.8 mL/min (by C-G formula based on SCr of 0.84 mg/dL).   Medical History: Past Medical History:  Diagnosis Date  . Aneurysm of internal carotid artery   . Anticardiolipin antibody positive    Chronic Coumadin  . Brain aneurysm    followed by Dr. 05/09/21  . Chronic combined systolic (congestive) and diastolic (congestive) heart  failure (HCC)   . Cocaine abuse in remission (HCC)   . Coronary atherosclerosis of native coronary artery    Previous invasive cardiac testing was done at a facility in [place], [place].  Occluded diagonal, Diffuse LAD disease, Occluded Cx, and non obstructive RCA.   . Essential hypertension   . Headache   . History of pneumonia   . ICD (implantable cardioverter-defibrillator) in place   . Ischemic cardiomyopathy    LVEF 30-35%  . Mixed hyperlipidemia   . NSVT (nonsustained ventricular tachycardia) (HCC)   . Persistent atrial fibrillation (HCC)   . PVD (peripheral vascular disease) (HCC)   . Raynaud's phenomenon   . Statin intolerance   . 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 Last dose taken 3/3 PM. INR 2.1 on admit. S/p R/LHC and successful VT ablation. She was restarted on warfarin and heparin post-ablation. Pharmacy now asked to switch heparin to bivalirudin and to continue warfarin.  Warfarin dosed earlier today. IV Heparin changed to IV Bivalirudin.  Initial aPTT is therapeutic at 50 on Bivalirudin 0.15 mg/kg/hr. Is trending down - will continue and recheck with AM labs.  No bleeding reported.  Goal of Therapy:  INR 2-3 aPTT 50-85 seconds  Monitor platelets by anticoagulation protocol: Yes   Plan:  Continue Bivalirudin at 0.15 mg/kg/hr (dosing weight: 87.7 kg) Follow-up daily level  Daily aPTT, INR, CBC, s/sx bleeding  [name] [name], PharmD, BCPS, BCCCP Clinical Pharmacist Please refer to [facility] for [facility] numbers 03/09/2021  9:24 PM  Please check [email or web address] for unit-specific pharmacy phone numbers.

## 2021-03-09 NOTE — Progress Notes (Addendum)
ANTICOAGULATION CONSULT NOTE   Pharmacy Consult for Warfarin and Heparin>Bivalirudin Indication: persistent AFib, anticardiolipin syndrome  Allergies  Allergen Reactions  . Beef-Derived Products Anaphylaxis    From tick bite  . Metoclopramide Hcl Anaphylaxis    Non responsive  . Penicillins Anaphylaxis    Pt states she can take any cephalosporin (per pt. 01/01/19) Shock Has patient had a PCN reaction causing immediate rash, facial/tongue/throat swelling, SOB or lightheadedness with hypotension: Yes Has patient had a PCN reaction causing severe rash involving mucus membranes or skin necrosis: No Has patient had a PCN reaction that required hospitalization Yes Has patient had a PCN reaction occurring within the last 10 years: No If all of the above answers are "NO", then may proceed with Cephalosporin   . Pork-Derived Products Anaphylaxis    From tick bite. Has tolerated heparin/lovenox  . Metoprolol Other (See Comments)    Syncope   . Reglan [Metoclopramide]   . Statins Other (See Comments)    Myalgias    Patient Measurements: Height: 5\' 5"  (165.1 cm) Weight: 87.7 kg (193 lb 6.4 oz) IBW/kg (Calculated) : 57 Heparin dosing weight: 76 kg  Vital Signs: Temp: (P) 98.3 F (36.8 C) (03/11 0810) Temp Source: (P) Oral (03/11 0810) BP: 127/65 (03/11 0810) Pulse Rate: 67 (03/11 0810)  Labs: Recent Labs    03/06/21 1945 03/07/21 0101 03/07/21 0101 03/08/21 0025 03/09/21 0333  HGB  --  13.3   < > 13.2 13.2  HCT  --  41.8  --  40.2 40.4  PLT  --  179  --  140* 96*  LABPROT  --  14.5  --  13.5 13.2  INR  --  1.2  --  1.1 1.0  HEPARINUNFRC 0.43 0.31  --   --  0.34  CREATININE  --  1.01*  --  0.94 0.84   < > = values in this interval not displayed.    Estimated Creatinine Clearance: 80.8 mL/min (by C-G formula based on SCr of 0.84 mg/dL).   Medical History: Past Medical History:  Diagnosis Date  . Aneurysm of internal carotid artery   . Anticardiolipin antibody  positive    Chronic Coumadin  . Brain aneurysm    followed by Dr. 05/09/21  . Chronic combined systolic (congestive) and diastolic (congestive) heart failure (HCC)   . Cocaine abuse in remission (HCC)   . Coronary atherosclerosis of native coronary artery    Previous invasive cardiac testing was done at a facility in [place], [place].  Occluded diagonal, Diffuse LAD disease, Occluded Cx, and non obstructive RCA.   . Essential hypertension   . Headache   . History of pneumonia   . ICD (implantable cardioverter-defibrillator) in place   . Ischemic cardiomyopathy    LVEF 30-35%  . Mixed hyperlipidemia   . NSVT (nonsustained ventricular tachycardia) (HCC)   . Persistent atrial fibrillation (HCC)   . PVD (peripheral vascular disease) (HCC)   . Raynaud's phenomenon   . Statin intolerance   . Stroke [facility]) 2007   Total occlusion of the right internal carotid    Assessment: [name] with history of persistent Afib and anticardiolipin syndrome on warfarin PTA. Last dose taken 3/3 PM. INR 2.1 on admit. S/p R/LHC and successful VT ablation. She was restarted on warfarin and heparin post-ablation. Pharmacy now asked to switch heparin to bivalirudin and to continue warfarin.  INR remains subtherapeutic at 1.0 after receiving first warfarin dose last night. Hgb stable at 13.2, pltc down 179>140>96.  No overt bleeding or infusion issues noted.   Plans for ICD system revision next week, possible Monday. Per EP, goal is for INR to be ~1.7 so that bivalirudin can be stopped safely prior to procedure. Will boost dose tonight.  Goal of Therapy:  INR 2-3 aPTT 50-85 seconds  Monitor platelets by anticoagulation protocol: Yes   Plan:  Stop IV heparin Start Bivalirudin at 0.15 mg/kg/hr (dosing weight: 87.7 kg) Give warfarin 10 mg PO x1 tonight Check 4-hr aPTT Daily aPTT, INR, CBC, s/sx bleeding  Tama Headings, PharmD, BCPS PGY2 Cardiology Pharmacy Resident Phone: [phone] 03/09/2021  10:56  AM  Please check [email or web address] for unit-specific pharmacy phone numbers.

## 2021-03-09 NOTE — Progress Notes (Signed)
Electrophysiology Rounding Note  Patient Name: [name] [name] Date of Encounter: 03/09/2021  Primary Cardiologist: Nona Dell, MD Electrophysiologist: Lewayne Bunting, MD   Subjective   "I feel like I got burnt"  "a little sore"  Inpatient Medications    Scheduled Meds: . amiodarone  400 mg Oral BID  . carvedilol  6.25 mg Oral BID WC  . clopidogrel  75 mg Oral Q breakfast  . dapagliflozin propanediol  10 mg Oral Daily  . digoxin  0.125 mg Oral Daily  . ezetimibe  10 mg Oral Daily  . isosorbide mononitrate  30 mg Oral Daily  . melatonin  3 mg Oral QHS  . mexiletine  150 mg Oral Q8H  . ranolazine  500 mg Oral BID  . sacubitril-valsartan  1 tablet Oral BID  . sodium chloride flush  3 mL Intravenous Q12H  . sodium chloride flush  3 mL Intravenous Q12H  . sodium chloride flush  3 mL Intravenous Q12H  . spironolactone  25 mg Oral Daily  . torsemide  60 mg Oral Daily  . Warfarin - Pharmacist Dosing Inpatient   Does not apply q1600   Continuous Infusions: . sodium chloride    . sodium chloride 50 mL/hr at 03/08/21 0549  . sodium chloride    . heparin 1,300 Units/hr (03/08/21 2132)   PRN Meds: sodium chloride, sodium chloride, acetaminophen, magnesium hydroxide, nitroGLYCERIN, ondansetron (ZOFRAN) IV, oxyCODONE-acetaminophen **AND** oxyCODONE, prochlorperazine, sodium chloride flush, sodium chloride flush, traZODone   Vital Signs    Vitals:   03/08/21 1945 03/08/21 1947 03/08/21 2255 03/09/21 0555  BP: (!) 157/74  (!) 117/53 (!) 133/46  Pulse: 82  82   Resp: (!) 24  (!) 23 15  Temp:  98 F (36.7 C) 97.7 F (36.5 C) 97.7 F (36.5 C)  TempSrc:  Oral Oral Oral  SpO2: 95%  94% 98%  Weight:      Height:        Intake/Output Summary (Last 24 hours) at 03/09/2021 0742 Last data filed at 03/09/2021 0225 Gross per 24 hour  Intake 1500 ml  Output 3430 ml  Net -1930 ml   Filed Weights   03/01/21 2051 03/02/21 1241 03/05/21 0418  Weight: 86.2 kg 87.2 kg 87.7 kg     Physical Exam    GEN- The patient is well appearing, alert and oriented x 3 today.   Head- normocephalic, atraumatic Eyes-  Sclera clear, conjunctiva pink Ears- hearing intact Oropharynx- clear Neck- supple Lungs- Clear to ausculation bilaterally, normal work of breathing Heart- Regular rate and rhythm, no murmurs, rubs or gallops GI- soft, NT, ND, + BS Extremities- no clubbing or cyanosis. No edema Skin- no rash or lesion Psych- euthymic mood, full affect Neuro- strength and sensation are intact  Labs    CBC Recent Labs    03/08/21 0025 03/09/21 0333  WBC 8.1 12.5*  HGB 13.2 13.2  HCT 40.2 40.4  MCV 90.3 92.0  PLT 140* 96*   Basic Metabolic Panel Recent Labs    77/41/42 0025 03/09/21 0333  NA 134* 137  K 4.0 4.2  CL 95* 104  CO2 29 26  GLUCOSE 120* 132*  BUN 15 17  CREATININE 0.94 0.84  CALCIUM 8.6* 8.7*   Liver Function Tests No results for input(s): AST, ALT, ALKPHOS, BILITOT, PROT, ALBUMIN in the last 72 hours. No results for input(s): LIPASE, AMYLASE in the last 72 hours. Cardiac Enzymes No results for input(s): CKTOTAL, CKMB, CKMBINDEX, TROPONINI in the last 72  hours.   Telemetry    NSR 60-70s (personally reviewed)  Radiology    No results found.  Patient Profile     [name] Stare Hooveris a 60 y.o.femalewith a history of of CAD (PCI of LCx in Jan 2022), ICM (s/pBoston Scientific dual chamber ICD implant 2017), cocaine abuse (in remission), CVA, AFIB, antiphospholipid syndrome (on Coumadin), who presented to the hospital for worsening SOB and 4 shocks from her ICDwho is being seen today for the evaluation of recurrent VTat the request of Dr. Shirlee Latch.  Assessment & Plan    1.Recurrent VT S/p VT ablation with scar modification 03/08/2021. She had several unstable VT circuits that were treated during the case.  Continue amio 400 mg BID Continue mexitil Maximize anti-ischemic therapies(imdur recently added) Will discuss timing of ICD  system revision with Dr. Lalla Brothers.   2.Chronic systolic dysfunction s/p Boston Scientificdual chamber ICD NYHA III symptoms.  Low filling pressures on cath3/7/22 On an appropriate medical regimen She received quite a bit of fluid during the case. May need more diuresis today, per HF team.   3. PVCs Has previously been described as a poorablation candidate with multiple morphologies.  Continue as above.   4. Anti-phospholipid syndrome Back on heparin with bridging with coumadin.  She had an appointment on 3/15 for labwork in regards to this diagnosis. May be able to find out what labs were needed and do as inpatient.   For questions or updates, please contact [facility] Please consult [email or web address] for contact info under Cardiology/STEMI.  Signed, Graciella Freer, PA-C  03/09/2021, 7:42 AM

## 2021-03-09 NOTE — Progress Notes (Signed)
ANTICOAGULATION CONSULT NOTE - Follow Up Consult  Pharmacy Consult for IV Heparin Indication: persistent AFib, anticardiolipin syndrome  Allergies  Allergen Reactions  . Beef-Derived Products Anaphylaxis    From tick bite  . Metoclopramide Hcl Anaphylaxis    Non responsive  . Penicillins Anaphylaxis    Pt states she can take any cephalosporin (per pt. 01/01/19) Shock Has patient had a PCN reaction causing immediate rash, facial/tongue/throat swelling, SOB or lightheadedness with hypotension: Yes Has patient had a PCN reaction causing severe rash involving mucus membranes or skin necrosis: No Has patient had a PCN reaction that required hospitalization Yes Has patient had a PCN reaction occurring within the last 10 years: No If all of the above answers are "NO", then may proceed with Cephalosporin   . Pork-Derived Products Anaphylaxis    From tick bite. Has tolerated heparin/lovenox  . Metoprolol Other (See Comments)    Syncope   . Reglan [Metoclopramide]   . Statins Other (See Comments)    Myalgias    Patient Measurements: Height: 5\' 5"  (165.1 cm) Weight: 87.7 kg (193 lb 6.4 oz) IBW/kg (Calculated) : 57 Heparin dosing weight: 76 kg  Vital Signs: Temp: 97.7 F (36.5 C) (03/10 2255) Temp Source: Oral (03/10 2255) BP: 117/53 (03/10 2255) Pulse Rate: 82 (03/10 2255)  Labs: Recent Labs    03/06/21 0530 03/06/21 1221 03/06/21 1945 03/07/21 0101 03/08/21 0025 03/09/21 0333  HGB 13.2  --   --  13.3 13.2  --   HCT 40.9  --   --  41.8 40.2  --   PLT 173  --   --  179 140*  --   LABPROT 15.0  --   --  14.5 13.5  --   INR 1.2  --   --  1.2 1.1  --   HEPARINUNFRC 0.24*   < > 0.43 0.31  --  0.34  CREATININE 0.92  --   --  1.01* 0.94  --    < > = values in this interval not displayed.    Estimated Creatinine Clearance: 72.2 mL/min (by C-G formula based on SCr of 0.94 mg/dL).   Medical History: Past Medical History:  Diagnosis Date  . Aneurysm of internal carotid  artery   . Anticardiolipin antibody positive    Chronic Coumadin  . Brain aneurysm    followed by Dr. 05/09/21  . Chronic combined systolic (congestive) and diastolic (congestive) heart failure (HCC)   . Cocaine abuse in remission (HCC)   . Coronary atherosclerosis of native coronary artery    Previous invasive cardiac testing was done at a facility in [place], [place].  Occluded diagonal, Diffuse LAD disease, Occluded Cx, and non obstructive RCA.   . Essential hypertension   . Headache   . History of pneumonia   . ICD (implantable cardioverter-defibrillator) in place   . Ischemic cardiomyopathy    LVEF 30-35%  . Mixed hyperlipidemia   . NSVT (nonsustained ventricular tachycardia) (HCC)   . Persistent atrial fibrillation (HCC)   . PVD (peripheral vascular disease) (HCC)   . Raynaud's phenomenon   . Statin intolerance   . Stroke [facility]) 2007   Total occlusion of the right internal carotid    Assessment: 60 yr old female with history of persistent atrial fibrillation and anticardiolipin syndrome was on warfarin PTA (last dose taken 3/3 PM); INR was 2.1 on admission. After R/L cardiac cath on 3/7, pharmacy was asked to start heparin 8 hrs after sheath removal.   Heparin was  turned off overnight for VT ablation this afternoon. INR remains subtherapeutic (1.1) after holding warfarin since 3/4. Hgb stable at 13.2, platelet count down 179>140. No overt bleeding or infusion issues noted.   Will plan to restart heparin infusion at prior rate (1300 units/hr) and warfarin tonight as long as no complications during VT ablation. Pharmacy was consulted to restart heparin infusion 6 hrs after sheath removal. Per cath lab RN, sheath was removed at 1450 PM today; pt has no bleeding issues post procedure.  3/11 AM update:  Heparin level therapeutic x 1 after re-start  Goal of Therapy:  INR 2-3 Heparin level 0.3-0.7 units/ml  Monitor platelets by anticoagulation protocol: Yes   Plan:  Cont  heparin at 1300 units/hr 1200 heparin level Monitor daily heparin level, INR, CBC Monitor for bleeding  Abran Duke, PharmD, BCPS Clinical Pharmacist Phone: [phone]

## 2021-03-09 NOTE — Plan of Care (Signed)

## 2021-03-09 NOTE — Progress Notes (Signed)
ANTICOAGULATION CONSULT NOTE   Pharmacy Consult for Bivalirudin Indication: persistent AFib, anticardiolipin syndrome  Allergies  Allergen Reactions  . Beef-Derived Products Anaphylaxis    From tick bite  . Metoclopramide Hcl Anaphylaxis    Non responsive  . Penicillins Anaphylaxis    Pt states she can take any cephalosporin (per pt. 01/01/19) Shock Has patient had a PCN reaction causing immediate rash, facial/tongue/throat swelling, SOB or lightheadedness with hypotension: Yes Has patient had a PCN reaction causing severe rash involving mucus membranes or skin necrosis: No Has patient had a PCN reaction that required hospitalization Yes Has patient had a PCN reaction occurring within the last 10 years: No If all of the above answers are "NO", then may proceed with Cephalosporin   . Pork-Derived Products Anaphylaxis    From tick bite. Has tolerated heparin/lovenox  . Metoprolol Other (See Comments)    Syncope   . Reglan [Metoclopramide]   . Statins Other (See Comments)    Myalgias    Patient Measurements: Height: 5\' 5"  (165.1 cm) Weight: 87.7 kg (193 lb 6.4 oz) IBW/kg (Calculated) : 57 Heparin dosing weight: 76 kg  Vital Signs: Temp: (P) 98.3 F (36.8 C) (03/11 0810) Temp Source: (P) Oral (03/11 0810) BP: 118/63 (03/11 1109) Pulse Rate: 67 (03/11 0810)  Labs: Recent Labs    03/06/21 1945 03/07/21 0101 03/07/21 0101 03/08/21 0025 03/09/21 0333 03/09/21 1608  HGB  --  13.3   < > 13.2 13.2  --   HCT  --  41.8  --  40.2 40.4  --   PLT  --  179  --  140* 96*  --   APTT  --   --   --   --   --  63*  LABPROT  --  14.5  --  13.5 13.2  --   INR  --  1.2  --  1.1 1.0  --   HEPARINUNFRC 0.43 0.31  --   --  0.34  --   CREATININE  --  1.01*  --  0.94 0.84  --    < > = values in this interval not displayed.    Estimated Creatinine Clearance: 80.8 mL/min (by C-G formula based on SCr of 0.84 mg/dL).   Medical History: Past Medical History:  Diagnosis Date  . Aneurysm  of internal carotid artery   . Anticardiolipin antibody positive    Chronic Coumadin  . Brain aneurysm    followed by Dr. 05/09/21  . Chronic combined systolic (congestive) and diastolic (congestive) heart failure (HCC)   . Cocaine abuse in remission (HCC)   . Coronary atherosclerosis of native coronary artery    Previous invasive cardiac testing was done at a facility in [place], [place].  Occluded diagonal, Diffuse LAD disease, Occluded Cx, and non obstructive RCA.   . Essential hypertension   . Headache   . History of pneumonia   . ICD (implantable cardioverter-defibrillator) in place   . Ischemic cardiomyopathy    LVEF 30-35%  . Mixed hyperlipidemia   . NSVT (nonsustained ventricular tachycardia) (HCC)   . Persistent atrial fibrillation (HCC)   . PVD (peripheral vascular disease) (HCC)   . Raynaud's phenomenon   . Statin intolerance   . Stroke [facility]) 2007   Total occlusion of the right internal carotid    Assessment: [name] with history of persistent Afib and anticardiolipin syndrome on warfarin PTA. Last dose taken 3/3 PM. INR 2.1 on admit. S/p R/LHC and successful VT ablation. She  was restarted on warfarin and heparin post-ablation. Pharmacy now asked to switch heparin to bivalirudin and to continue warfarin.  Warfarin dosed earlier today. IV Heparin changed to IV Bivalirudin.  Initial aPTT is therapeutic at 63 on Bivalirudin 0.15 mg/kg/hr.  No bleeding reported.    Goal of Therapy:  INR 2-3 aPTT 50-85 seconds  Monitor platelets by anticoagulation protocol: Yes   Plan:  Continue Bivalirudin at 0.15 mg/kg/hr (dosing weight: 87.7 kg) Recheck ~4-hr aPTT to confirm remains at goal, then can extend to daily aPTTs. Daily aPTT, INR, CBC, s/sx bleeding  Link Snuffer, PharmD, BCPS, BCCCP Clinical Pharmacist Please refer to [facility] for [facility] Pharmacy numbers 03/09/2021  5:17 PM  Please check [email or web address] for unit-specific pharmacy phone numbers.

## 2021-03-10 DIAGNOSIS — I480 Paroxysmal atrial fibrillation: Secondary | ICD-10-CM

## 2021-03-10 LAB — CBC
HCT: 38.4 % (ref 36.0–46.0)
Hemoglobin: 12.4 g/dL (ref 12.0–15.0)
MCH: 29.9 pg (ref 26.0–34.0)
MCHC: 32.3 g/dL (ref 30.0–36.0)
MCV: 92.5 fL (ref 80.0–100.0)
Platelets: 111 10*3/uL — ABNORMAL LOW (ref 150–400)
RBC: 4.15 MIL/uL (ref 3.87–5.11)
RDW: 13.2 % (ref 11.5–15.5)
WBC: 9.3 10*3/uL (ref 4.0–10.5)
nRBC: 0 % (ref 0.0–0.2)

## 2021-03-10 LAB — BASIC METABOLIC PANEL
Anion gap: 8 (ref 5–15)
BUN: 19 mg/dL (ref 6–20)
CO2: 27 mmol/L (ref 22–32)
Calcium: 8.5 mg/dL — ABNORMAL LOW (ref 8.9–10.3)
Chloride: 101 mmol/L (ref 98–111)
Creatinine, Ser: 0.85 mg/dL (ref 0.44–1.00)
GFR, Estimated: >60 mL/min (ref >60)
Glucose, Bld: 107 mg/dL — ABNORMAL HIGH (ref 70–99)
Potassium: 4.6 mmol/L (ref 3.5–5.1)
Sodium: 136 mmol/L (ref 135–145)

## 2021-03-10 LAB — APTT
aPTT: 41 seconds — ABNORMAL HIGH (ref 24–36)
aPTT: 79 seconds — ABNORMAL HIGH (ref 24–36)
aPTT: 79 seconds — ABNORMAL HIGH (ref 24–36)

## 2021-03-10 LAB — CARDIOLIPIN ANTIBODIES, IGM+IGG
Anticardiolipin IgG: <9 GPL U/mL (ref 0–14)
Anticardiolipin IgM: 149 MPL U/mL — ABNORMAL HIGH (ref 0–12)

## 2021-03-10 LAB — PROTIME-INR
INR: 1.6 — ABNORMAL HIGH (ref 0.8–1.2)
Prothrombin Time: 18.2 seconds — ABNORMAL HIGH (ref 11.4–15.2)

## 2021-03-10 MED ORDER — DULOXETINE HCL 60 MG PO CPEP
60.0000 mg | ORAL_CAPSULE | Freq: Every evening | ORAL | Status: DC
  Administered 2021-03-10 – 2021-03-14 (×5): 60 mg via ORAL
  Filled 2021-03-10 (×5): qty 1

## 2021-03-10 MED ORDER — DULOXETINE HCL 30 MG PO CPEP
30.0000 mg | ORAL_CAPSULE | Freq: Every day | ORAL | Status: DC
Start: 2021-03-10 — End: 2021-03-15
  Administered 2021-03-10 – 2021-03-15 (×5): 30 mg via ORAL
  Filled 2021-03-10 (×7): qty 1

## 2021-03-10 MED ORDER — CARVEDILOL 12.5 MG PO TABS
12.5000 mg | ORAL_TABLET | Freq: Two times a day (BID) | ORAL | Status: DC
  Administered 2021-03-10 – 2021-03-13 (×6): 12.5 mg via ORAL
  Filled 2021-03-10 (×6): qty 1

## 2021-03-10 MED ORDER — SENNOSIDES-DOCUSATE SODIUM 8.6-50 MG PO TABS
1.0000 | ORAL_TABLET | Freq: Every day | ORAL | Status: DC
  Administered 2021-03-10 – 2021-03-11 (×2): 1 via ORAL
  Filled 2021-03-10 (×3): qty 1

## 2021-03-10 MED ORDER — LIDOCAINE IN D5W 4-5 MG/ML-% IV SOLN
1.0000 mg/min | INTRAVENOUS | Status: DC
  Administered 2021-03-10: 1 mg/min via INTRAVENOUS
  Filled 2021-03-10 (×2): qty 500

## 2021-03-10 MED ORDER — ALPRAZOLAM 0.5 MG PO TABS
0.5000 mg | ORAL_TABLET | Freq: Two times a day (BID) | ORAL | Status: DC | PRN
  Administered 2021-03-10 (×2): 0.5 mg via ORAL
  Filled 2021-03-10 (×2): qty 1

## 2021-03-10 MED ORDER — CHLORHEXIDINE GLUCONATE CLOTH 2 % EX PADS
6.0000 | MEDICATED_PAD | Freq: Every day | CUTANEOUS | Status: DC
  Administered 2021-03-10 – 2021-03-13 (×4): 6 via TOPICAL

## 2021-03-10 MED ORDER — TORSEMIDE 20 MG PO TABS
60.0000 mg | ORAL_TABLET | Freq: Every day | ORAL | Status: DC
  Administered 2021-03-11 – 2021-03-13 (×3): 60 mg via ORAL
  Filled 2021-03-10 (×3): qty 3

## 2021-03-10 MED ORDER — WHITE PETROLATUM EX OINT
TOPICAL_OINTMENT | CUTANEOUS | Status: AC
  Filled 2021-03-10: qty 28.35

## 2021-03-10 MED ORDER — LIDOCAINE BOLUS VIA INFUSION
100.0000 mg | Freq: Once | INTRAVENOUS | Status: AC
  Administered 2021-03-10: 100 mg via INTRAVENOUS
  Filled 2021-03-10: qty 100

## 2021-03-10 MED ORDER — WARFARIN SODIUM 2 MG PO TABS
2.0000 mg | ORAL_TABLET | Freq: Once | ORAL | Status: AC
  Administered 2021-03-10: 2 mg via ORAL
  Filled 2021-03-10: qty 1

## 2021-03-10 NOTE — Progress Notes (Addendum)
Progress Note   Subjective   She had VT overnight.  This resolved with vagal maneuver fortunately.  She has been placed back on IV lidocaine.  Very anxious.  CHF is stable.  Inpatient Medications    Scheduled Meds: . amiodarone  400 mg Oral BID  . carvedilol  6.25 mg Oral BID WC  . Chlorhexidine Gluconate Cloth  6 each Topical Daily  . clopidogrel  75 mg Oral Q breakfast  . dapagliflozin propanediol  10 mg Oral Daily  . digoxin  0.125 mg Oral Daily  . ezetimibe  10 mg Oral Daily  . isosorbide mononitrate  30 mg Oral Daily  . melatonin  3 mg Oral QHS  . ranolazine  500 mg Oral BID  . sacubitril-valsartan  1 tablet Oral BID  . sodium chloride flush  3 mL Intravenous Q12H  . sodium chloride flush  3 mL Intravenous Q12H  . sodium chloride flush  3 mL Intravenous Q12H  . spironolactone  25 mg Oral Daily  . warfarin  2 mg Oral ONCE-1600  . Warfarin - Pharmacist Dosing Inpatient   Does not apply q1600  . white petrolatum       Continuous Infusions: . sodium chloride    . sodium chloride    . bivalirudin (ANGIOMAX) infusion 0.5 mg/mL (Non-ACS indications) 0.2 mg/kg/hr (03/10/21 0810)  . lidocaine 1 mg/min (03/10/21 0700)   PRN Meds: sodium chloride, sodium chloride, acetaminophen, magnesium hydroxide, nitroGLYCERIN, ondansetron (ZOFRAN) IV, oxyCODONE-acetaminophen **AND** oxyCODONE, prochlorperazine, sodium chloride flush, sodium chloride flush, traZODone   Vital Signs    Vitals:   03/10/21 0806 03/10/21 0900 03/10/21 1000 03/10/21 1016  BP: (!) 101/54 (!) 98/48 139/64   Pulse: 64 61 64 62  Resp:  11 14   Temp:      TempSrc:      SpO2:  95% 98%   Weight:      Height:        Intake/Output Summary (Last 24 hours) at 03/10/2021 1021 Last data filed at 03/10/2021 0810 Gross per 24 hour  Intake 1872.95 ml  Output 9150 ml  Net -7277.05 ml   Filed Weights   03/02/21 1241 03/05/21 0418 03/10/21 0535  Weight: 87.2 kg 87.7 kg 87.3 kg    Telemetry    Sinus (telemetry  from telemetry floor not currently available) - Personally Reviewed  Physical Exam   GEN- The patient is anxious and worried appearing, alert and oriented x 3 today.   Head- normocephalic, atraumatic Eyes-  Sclera clear, conjunctiva pink Ears- hearing intact Oropharynx- clear Neck- supple, Lungs-  normal work of breathing Heart- Regular rate and rhythm  GI- soft  Extremities- no clubbing, cyanosis, or edema  MS- no significant deformity or atrophy Skin- no rash or lesion Psych- euthymic mood, full affect Neuro- strength and sensation are intact  ekg 03/10/21 4:08 am-  VT with CL of 410 msec,  Morphology is L bundle branch R superior axis with diffusely negative precordial activation (suggesting apical origin)  Labs    Chemistry Recent Labs  Lab 03/08/21 0025 03/09/21 0333 03/10/21 0047  NA 134* 137 136  K 4.0 4.2 4.6  CL 95* 104 101  CO2 29 26 27   GLUCOSE 120* 132* 107*  BUN 15 17 19   CREATININE 0.94 0.84 0.85  CALCIUM 8.6* 8.7* 8.5*  GFRNONAA >60 >60 >60  ANIONGAP 10 7 8      Hematology Recent Labs  Lab 03/08/21 0025 03/09/21 0333 03/10/21 0047  WBC 8.1 12.5* 9.3  RBC 4.45 4.39 4.15  HGB 13.2 13.2 12.4  HCT 40.2 40.4 38.4  MCV 90.3 92.0 92.5  MCH 29.7 30.1 29.9  MCHC 32.8 32.7 32.3  RDW 12.8 13.0 13.2  PLT 140* 96* 111*     Patient ID  [name] Vink Hooveris a 60 y.o.femalewith a history of of CAD (PCI of LCx in Jan 2022), ICM (s/pBoston Scientific dual chamber ICD implant 2017), cocaine abuse (in remission), CVA, AFIB, antiphospholipid syndrome (on Coumadin), who presented to the hospital for worsening SOB and 4 shocks from her ICDwho is being seen today for the evaluation of recurrent VTat the request of Dr. Shirlee Latch. Assessment & Plan    1.  VT The patient had recurrence of VT overnight for which lidocaine was restarted.  This VT was similar to that described by Dr Lalla Brothers in his recent ablation report (R superior axis with diffusely negative precordial  activation (suggesting apical origin).  The patient had extensive scar along the anterior/anterolateral/lateral LV scar detailed with voltage mapping c/w prior coronary artery disease history.  Scar homogenization was performed along the anterolateral/lateral scar extending from the apex to the mitral valve annulus.   Activation mapping was limited by hemodynamic instability.   If further ablation was to be considered, it would likely require hemodynamic support.  Given extensive ablation performed already, I am not certain that this would be successful in achieving our goals of treating her VT- successful treatment may require epicardial ablation in addition.  Our options unfortunately, are very limited.  I will continue IV lidocaine today. I will increase coreg to 12.5mg  BID today and titrate as CHF/ BP allow.  I will interrogate her ICD today to see if we can optimize her ATP scheme further to treat VT and also minimize ICD shocks.  Our only other medicine option is probably quinidine.  I am not very enthusiastic about using this medicine given already low platelet count.  Plans for DFT testing on Monday with possible system revision are noted.  I have spoken with Dr Lalla Brothers today.  We agree that in the setting of ongoing active VT and IV bivalrudine that risks of this procedure probably outweigh benefits in the short term.  We may need to delay this until VT has been stabilized and she is therapeutic on coumadin/ off bivalrudin.  2. Chronic systolic dysfunction/ ischemic CM/ CAD No ischemic symptoms No new findings on recent cath to explain VT Follows with CHF team  3. 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  Given decline in platelets here, heparin has been switched to bivalrudin drip as a precaution).  INR is 1.6 today.  I worry about bleeding risks with ICD system revision on bivalrudin gtt, coumadin AND plavix.  4.  afib Remains in sinus chads2vasc score is at least 6.  She is chronically on coumadin  5. Anxiety/ PTSD s/p ICD shocks Discussed with Dr Gala Romney.  Difficult situation given her overall paucity of options for VT. I think we should keep her on IV lidocaine today to minimize likelihood of short term VT/ ICD shock recurrence.  Treat VT as above. I agree with use of alprazolam for the short term.  We will restart her cymbalta.  We could consider outpatient referral to Dr Bosie Clos (PsyD) for assistance with PTSD/ anxiety related to ICD therapy once clinically improved.  Very difficult situation.  EP to follow over the weekend.     Critical care time was exclusive of separate  billable procedures and treating other patients.  Critial care time was spent personally by me (independant of midlevel providers) on the following activities: development of treatment plan with patient, discussions with Drs Gala Romney and Lalla Brothers, evaluation of patients response to treatment, examining patient, obtaining history from patient, ordering/ reviewing treatments/ interventions, lab studies, radiographic studies, pulse ox, and re-evaluation of patients condition.     The patient is critically ill with active ventricular arrhythmias and requires high complexity decision making for assessment and support, frequent evaluation and titration of therapies, application of advanced monitoring technologies and extensive interpretation of databases.   Critical care was necessary to treat or prevent immintent or life-threatening deterioration.   Total CCT spent directly with the patient today is 45 minutes  Hillis Range MD, [facility] 03/10/2021 11:16 AM  ICD interrogation personally reviewed.  Interrogated for VT below detection.  I have added a VT 1 zone from 130 bpm with ATP x 4 (burst 81%), ATP x 4 (ramp/scan 81%), no shocks. Previous VT zone at 170 bpm was moved to 180 bpm.  All therapies stayed the same.  VF zone remains  the same at 200 bpm.  Shock lead polarity was changed from reversed to initial.  Hillis Range MD, [facility]

## 2021-03-10 NOTE — Progress Notes (Addendum)
Approximately [phone] patient went into sustained VT for 19 minutes . Rapid Response RN notified and Dr. Gean Quint came to bedside. Patient stable A & 0 X 4 and asymptomatic. She was able to eventually convert out with bearing down. She did go on lidocaine gtt. She was then transported to ICU 2H.

## 2021-03-10 NOTE — Progress Notes (Addendum)
ANTICOAGULATION CONSULT NOTE   Pharmacy Consult for Warfarin and Bivalirudin Indication: persistent AFib, anticardiolipin syndrome  Allergies  Allergen Reactions  . Beef-Derived Products Anaphylaxis    From tick bite  . Metoclopramide Hcl Anaphylaxis    Non responsive  . Penicillins Anaphylaxis    Pt states she can take any cephalosporin (per pt. 01/01/19) Shock Has patient had a PCN reaction causing immediate rash, facial/tongue/throat swelling, SOB or lightheadedness with hypotension: Yes Has patient had a PCN reaction causing severe rash involving mucus membranes or skin necrosis: No Has patient had a PCN reaction that required hospitalization Yes Has patient had a PCN reaction occurring within the last 10 years: No If all of the above answers are "NO", then may proceed with Cephalosporin   . Pork-Derived Products Anaphylaxis    From tick bite. Has tolerated heparin/lovenox  . Metoprolol Other (See Comments)    Syncope   . Reglan [Metoclopramide]   . Statins Other (See Comments)    Myalgias    Patient Measurements: Height: 5\' 5"  (165.1 cm) Weight: 87.3 kg (192 lb 7.4 oz) IBW/kg (Calculated) : 57 Heparin dosing weight: 76 kg  Vital Signs: Temp: 97.3 F (36.3 C) (03/12 0535) Temp Source: Oral (03/12 0535) BP: 101/54 (03/12 0806) Pulse Rate: 64 (03/12 0806)  Labs: Recent Labs    03/08/21 0025 03/09/21 0333 03/09/21 1608 03/09/21 2050 03/10/21 0047  HGB 13.2 13.2  --   --  12.4  HCT 40.2 40.4  --   --  38.4  PLT 140* 96*  --   --  111*  APTT  --   --  63* 50* 41*  LABPROT 13.5 13.2  --   --  18.2*  INR 1.1 1.0  --   --  1.6*  HEPARINUNFRC  --  0.34  --   --   --   CREATININE 0.94 0.84  --   --  0.85    Estimated Creatinine Clearance: 79.7 mL/min (by C-G formula based on SCr of 0.85 mg/dL).   Medical History: Past Medical History:  Diagnosis Date  . Aneurysm of internal carotid artery   . Anticardiolipin antibody positive    Chronic Coumadin  . Brain  aneurysm    followed by Dr. 05/10/21  . Chronic combined systolic (congestive) and diastolic (congestive) heart failure (HCC)   . Cocaine abuse in remission (HCC)   . Coronary atherosclerosis of native coronary artery    Previous invasive cardiac testing was [place] in [place].  Occluded diagonal, Diffuse LAD disease, Occluded Cx, and non obstructive RCA.   . Essential hypertension   . Headache   . History of pneumonia   . ICD (implantable cardioverter-defibrillator) in place   . Ischemic cardiomyopathy    LVEF 30-35%  . Mixed hyperlipidemia   . NSVT (nonsustained ventricular tachycardia) (HCC)   . Persistent atrial fibrillation (HCC)   . PVD (peripheral vascular disease) (HCC)   . Raynaud's phenomenon   . Statin intolerance   . Stroke [facility]) 2007   Total occlusion of the right internal carotid    Assessment: [name] with history of persistent Afib and anticardiolipin syndrome on warfarin PTA. S/p R/LHC and successful VT ablation, restarted on warfarin and heparin post-ablation. Pharmacy asked to switch heparin to bivalirudin and to continue warfarin.  Plans for ICD system revision next week, possible Monday. Per EP, goal is for INR to be ~1.7 so that bivalirudin can be stopped safely prior to procedure.  INR remains subtherapeutic but jumped from 1.0 > 1.6 after boost dose yesterday. APTT therapeutic at 79 sec after increasing bivalirudin infusion to 0.2 mg/kg/hr. Hgb down 13.2>12.4, pltc improved 96>111. No overt bleeding or infusion issues per discussion with nursing.  PTA: 5 mg daily except 10 mg Sun/Thurs INR on admit: 2.1, last dose 3/3  Goal of Therapy:  INR 2-3 aPTT 50-85 seconds  Monitor platelets by anticoagulation protocol: Yes   Plan:  Continue Bivalirudin at 0.2 mg/kg/hr (dosing weight: 87.7 kg) Give warfarin 2 mg PO x1 tonight Check 4-hr confirmatory aPTT Daily aPTT, INR, CBC, s/sx bleeding  Tama Headings, PharmD, BCPS PGY2 Cardiology  Pharmacy Resident Phone: [phone] 03/10/2021  8:18 AM =============================== PM Addendum: confirmatory aPTT remains therapeutic at 79 seconds. No overt bleeding or infusion issues noted. Will continue current rate of bivalirudin and recheck aPTT with AM labs.  Tama Headings, PharmD, BCPS PGY2 Cardiology Pharmacy Resident Phone: [phone] 03/10/2021  3:09 PM  Please check [email or web address] for unit-specific pharmacy phone numbers.

## 2021-03-10 NOTE — Progress Notes (Signed)
ANTICOAGULATION CONSULT NOTE   Pharmacy Consult for Bivalirudin Indication: persistent AFib, anticardiolipin syndrome  Allergies  Allergen Reactions  . Beef-Derived Products Anaphylaxis    From tick bite  . Metoclopramide Hcl Anaphylaxis    Non responsive  . Penicillins Anaphylaxis    Pt states she can take any cephalosporin (per pt. 01/01/19) Shock Has patient had a PCN reaction causing immediate rash, facial/tongue/throat swelling, SOB or lightheadedness with hypotension: Yes Has patient had a PCN reaction causing severe rash involving mucus membranes or skin necrosis: No Has patient had a PCN reaction that required hospitalization Yes Has patient had a PCN reaction occurring within the last 10 years: No If all of the above answers are "NO", then may proceed with Cephalosporin   . Pork-Derived Products Anaphylaxis    From tick bite. Has tolerated heparin/lovenox  . Metoprolol Other (See Comments)    Syncope   . Reglan [Metoclopramide]   . Statins Other (See Comments)    Myalgias    Patient Measurements: Height: 5\' 5"  (165.1 cm) Weight: 87.7 kg (193 lb 6.4 oz) IBW/kg (Calculated) : 57 Heparin dosing weight: 76 kg  Vital Signs: Temp: 97.7 F (36.5 C) (03/11 2300) Temp Source: Oral (03/11 2300) BP: 93/53 (03/11 2300) Pulse Rate: 64 (03/11 2300)  Labs: Recent Labs    03/08/21 0025 03/09/21 0333 03/09/21 1608 03/09/21 2050 03/10/21 0047  HGB 13.2 13.2  --   --  12.4  HCT 40.2 40.4  --   --  38.4  PLT 140* 96*  --   --  111*  APTT  --   --  63* 50* 41*  LABPROT 13.5 13.2  --   --  18.2*  INR 1.1 1.0  --   --  1.6*  HEPARINUNFRC  --  0.34  --   --   --   CREATININE 0.94 0.84  --   --  0.85    Estimated Creatinine Clearance: 79.9 mL/min (by C-G formula based on SCr of 0.85 mg/dL).  Assessment: 60 yo Female with history of persistent Afib and anticardiolipin syndrome, INR subtherapeutic, and possible HIT, for bivalirudin   Goal of Therapy:  INR 2-3 aPTT 50-85  seconds  Monitor platelets by anticoagulation protocol: Yes   Plan:  Increase Bivalirudin at 0.2 mg/kg/hr  APTT in 4 hours  60, PharmD, BCPS  03/10/2021  3:07 AM

## 2021-03-10 NOTE — Significant Event (Signed)
Rapid Response Event Note   Reason for Call : Sustained VT post VT ablation Initial Focused Assessment:  Notified by nursing staff of pt in sustained VT with a pulse. Upon arrival, pt was alert, anxious, oriented x4. In VT 135-150. Pt denied CP and SOB. She described it as chest pressure. Skin warm, pink and dry. 12 lead EKG done to confirm VT. Dr. Hyacinth Meeker notified and came to bedside. Lidocaine gtt ordered. Pt spontaneously converted back to SR at 0417. 19 minutes of VT. Lidocaine gtt initiated and order obtained for transfer to ICU.  Last potassium 4.6 (3/12 0047)  0420- HR 75 SR, 160/83 (104), RR 17 with sats 97% on RA  Interventions:  -Lidocaine gtt -transfer to ICU 2H19    MD Notified: Dr. Hyacinth Meeker Call Time: 0400 Arrival Time: 0403 End Time: 0530  Rose Fillers, RN

## 2021-03-10 NOTE — Progress Notes (Signed)
Patient ID: [name] [name], female   DOB: [birth year], 60 y.o.   MRN: [id number]     Advanced Heart Failure Rounding Note  PCP-Cardiologist: Nona Dell, MD   Subjective:    - Admitted  for ICD shocks x 4 for VT. - 3/7 had RHC/LHC with stable coronaries, low filling pressures, and preserved cardiac output. Post cath torsemide cut back. - 3/10 S/P VT ablation  - 3/12 VT x 20 mins  Had 20 mins of VT overnight while on amio and mexilitene. Switched to lidocaine. Now remains in NSR.   She is very anxious and tearful about recurrent VT.   Denies SOB, orthopnea or PND.   Has diurese well on IV lasix. Negative 6.4L yesterday.  K 4.6   Objective:   Weight Range: 87.3 kg Body mass index is 32.03 kg/m.   Vital Signs:   Temp:  [97.3 F (36.3 C)-97.7 F (36.5 C)] 97.3 F (36.3 C) (03/12 0535) Pulse Rate:  [59-141] 62 (03/12 1016) Resp:  [11-18] 14 (03/12 1000) BP: (93-160)/(48-83) 139/64 (03/12 1000) SpO2:  [93 %-100 %] 98 % (03/12 1000) Weight:  [87.3 kg] 87.3 kg (03/12 0535) Last BM Date: 03/05/21  Weight change: Filed Weights   03/02/21 1241 03/05/21 0418 03/10/21 0535  Weight: 87.2 kg 87.7 kg 87.3 kg    Intake/Output:   Intake/Output Summary (Last 24 hours) at 03/10/2021 1052 Last data filed at 03/10/2021 0810 Gross per 24 hour  Intake 1872.95 ml  Output 9150 ml  Net -7277.05 ml      Physical Exam   General:  Sitting up in bed. Tearful  No resp difficulty HEENT: normal Neck: supple. JVP 5 Carotids 2+ bilat; no bruits. No lymphadenopathy or thryomegaly appreciated. Cor: PMI nondisplaced. Regular rate & rhythm. No rubs, gallops or murmurs. Lungs: clear Abdomen: soft, nontender, nondistended. No hepatosplenomegaly. No bruits or masses. Good bowel sounds. Extremities: no cyanosis, clubbing, rash, edema Neuro: alert & orientedx3, cranial nerves grossly intact. moves all 4 extremities w/o difficulty. Affect pleasant   Telemetry   NSR 62, Sustained VT overnight.  Personally reviewed   Labs    CBC Recent Labs    03/09/21 0333 03/10/21 0047  WBC 12.5* 9.3  HGB 13.2 12.4  HCT 40.4 38.4  MCV 92.0 92.5  PLT 96* 111*   Basic Metabolic Panel Recent Labs    56/21/30 0333 03/10/21 0047  NA 137 136  K 4.2 4.6  CL 104 101  CO2 26 27  GLUCOSE 132* 107*  BUN 17 19  CREATININE 0.84 0.85  CALCIUM 8.7* 8.5*   Liver Function Tests No results for input(s): AST, ALT, ALKPHOS, BILITOT, PROT, ALBUMIN in the last 72 hours. No results for input(s): LIPASE, AMYLASE in the last 72 hours. Cardiac Enzymes No results for input(s): CKTOTAL, CKMB, CKMBINDEX, TROPONINI in the last 72 hours.  BNP: BNP (last 3 results) Recent Labs    12/14/20 1921 02/27/21 1605 03/01/21 2125  BNP 209.0* 320.5* 194.0*    ProBNP (last 3 results) No results for input(s): PROBNP in the last 8760 hours.   D-Dimer No results for input(s): DDIMER in the last 72 hours. Hemoglobin A1C No results for input(s): HGBA1C in the last 72 hours. Fasting Lipid Panel No results for input(s): CHOL, HDL, LDLCALC, TRIG, CHOLHDL, LDLDIRECT in the last 72 hours. Thyroid Function Tests No results for input(s): TSH, T4TOTAL, T3FREE, THYROIDAB in the last 72 hours.  Invalid input(s): FREET3  Other results:   Imaging    No results found.  Medications:     Scheduled Medications: . amiodarone  400 mg Oral BID  . carvedilol  6.25 mg Oral BID WC  . Chlorhexidine Gluconate Cloth  6 each Topical Daily  . clopidogrel  75 mg Oral Q breakfast  . dapagliflozin propanediol  10 mg Oral Daily  . digoxin  0.125 mg Oral Daily  . ezetimibe  10 mg Oral Daily  . isosorbide mononitrate  30 mg Oral Daily  . melatonin  3 mg Oral QHS  . ranolazine  500 mg Oral BID  . sacubitril-valsartan  1 tablet Oral BID  . sodium chloride flush  3 mL Intravenous Q12H  . sodium chloride flush  3 mL Intravenous Q12H  . sodium chloride flush  3 mL Intravenous Q12H  . spironolactone  25 mg Oral Daily   . warfarin  2 mg Oral ONCE-1600  . Warfarin - Pharmacist Dosing Inpatient   Does not apply q1600  . white petrolatum        Infusions: . sodium chloride    . sodium chloride    . bivalirudin (ANGIOMAX) infusion 0.5 mg/mL (Non-ACS indications) 0.2 mg/kg/hr (03/10/21 0810)  . lidocaine 1 mg/min (03/10/21 0700)    PRN Medications: sodium chloride, sodium chloride, acetaminophen, ALPRAZolam, magnesium hydroxide, nitroGLYCERIN, ondansetron (ZOFRAN) IV, oxyCODONE-acetaminophen **AND** oxyCODONE, prochlorperazine, sodium chloride flush, sodium chloride flush, traZODone   Assessment/Plan   1. VT: Suspect scar-mediated VT, no new coronary disease on angiography this admission.  Prior VT in 1/22.  Had PCI to LCx in 1/22. HS-TnI up to 3070 but appears to be demand ischemia with VT and shocks x 4 rather than true ACS as no new disease on coronary angiography. Shocks by ICD were ineffective. Recurrent VT overnight. Now quiescent on IV lido - S/P VT ablation 3/10.  - Continue  amiodarone 400 mg bid and ranolazine - Mexilitene switched to IV lido.  - D/w EP who is seeing today.  - Keep K > 4.0 Mg > 2.0 - Plan noted for DFTs on Monday with possble ICD system revision given ineffective shocks.  2. CAD: MI in 2008 in [facility], cath at the time showed totally occluded diagonal, totally occluded LCX, and diffuse LAD disease.  No intervention. LHC was done in 12/21 due to VT, this showed chronically occluded D1 and 95% proximal LCx.  Initially, no PCI due to thrombocytopenia.  She eventually had PCI to proximal LCx in 1/22.  Elevated troponin appears to be demand ischemic as no new CAD noted on coronary angiography this admission.  No chest pain. - Continue Zetia, has not tolerated statins.  Good lipids in 12/21.   - No s/s angina despite VT - Continue Plavix 75 mg daily x at least 6 months post PCI and ideally 1 year. - She is off ASA given use of warfarin and Plavix.  3. Atrial fibrillation: TEE-guided DCCV  to NSR in 12/21.  She is in NSR. No breakthrough Afib on device interrogation.  - continue amiodarone  - If atrial fibrillation recurs, will need to consider Tikosyn versus ablation.  - Continue IV heparin gtt and coumadin. INR 1.6 4. Carotid stenosis: Known RICA occlusion. 03/05/21 dopplers with TO RICA, 1-39% LICA.   4. 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  - She is on chronic coumadin, now on hold for procedures.  Continue heparin gtt.  6. Chronic systolic CHF: Ischemic cardiomyopathy.  [employer] ICD.  Echo (8/21) with EF 15-20%, anteroseptal AK, RV normal, severe LAE, mild MR.  TEE (12/21)  with EF 25-30%.  RHC in 12/21 with preserved cardiac output and optimized filling pressures.  NYHA class II symptoms, not volume overloaded on exam.  RHC showed preserved cardiac output and low filling pressures.  - Volume status much improved with IV lasix. Will stop. Can resume torsemide 60 daily  tomorrow  - Continue Entresto 24-26 bid  - Continue Farxiga 10 mg daily.   - Continue spironolactone 25 mg daily.  - Continue Coreg 6.25 mg bid.  - Continue digoxin, level ok.  - Follow lytes and renal function  7. Smoking: Active smoker, wants to stop on her own.  8. PAD: She denies claudication.  - Needs to quit smoking.  9. 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  Plts have dropped in hospital in past, not thought to be HIT.  Platelets back up to 111k today - She will see Dr. Truett Perna (hematology).  10. Mitral regurgitation: Moderate on 12/21 TEE, suspect functional.  11. Anxiety/PTSD from shocks  - start Xanax - consider SSRI but I didn't start due to risk of increased arrhythmogenesis with QT prolongation. Wil defer to EPs recs   Length of Stay: 8  [name], MD  03/10/2021, 10:52 AM  Advanced Heart Failure Team Pager [phone] (M-F; 7a - 5p)  Please contact [facility] for night-coverage after hours (4p -7a ) and weekends on [email or web address]

## 2021-03-10 NOTE — Significant Event (Signed)
I was called to evaluate [name] [name] overnight at around 4 AM who was found to be in sustained monomorphic VT with heart rates in the 140s.  Fortunately she was hemodynamically stable otherwise and normotensive despite sustained VT for nearly 20 minutes.  She has a Environmental manager ICD in place, however heart rate did not reach threshold for ICD firing and per chart review noted potential issues with ICD.  Given sustained VT and patient with mild symptoms from this, I discontinued her mexiletine overnight and bolused with IV lidocaine and started a lidocaine infusion.  Fortunately she reverted to sinus rhythm incidentally on her own after about 20 minutes.  She was transferred to 2h for further monitoring.  Gerre Scull Cardiology Fellow

## 2021-03-11 LAB — BASIC METABOLIC PANEL
Anion gap: 7 (ref 5–15)
BUN: 14 mg/dL (ref 6–20)
CO2: 27 mmol/L (ref 22–32)
Calcium: 8.9 mg/dL (ref 8.9–10.3)
Chloride: 102 mmol/L (ref 98–111)
Creatinine, Ser: 0.87 mg/dL (ref 0.44–1.00)
GFR, Estimated: >60 mL/min (ref >60)
Glucose, Bld: 98 mg/dL (ref 70–99)
Potassium: 4.9 mmol/L (ref 3.5–5.1)
Sodium: 136 mmol/L (ref 135–145)

## 2021-03-11 LAB — CBC
HCT: 39.9 % (ref 36.0–46.0)
Hemoglobin: 13.6 g/dL (ref 12.0–15.0)
MCH: 30.8 pg (ref 26.0–34.0)
MCHC: 34.1 g/dL (ref 30.0–36.0)
MCV: 90.5 fL (ref 80.0–100.0)
Platelets: 108 10*3/uL — ABNORMAL LOW (ref 150–400)
RBC: 4.41 MIL/uL (ref 3.87–5.11)
RDW: 13.4 % (ref 11.5–15.5)
WBC: 9.8 10*3/uL (ref 4.0–10.5)
nRBC: 0.2 % (ref 0.0–0.2)

## 2021-03-11 LAB — LIDOCAINE LEVEL: Lidocaine Lvl: 2 ug/mL (ref 1.5–5.0)

## 2021-03-11 LAB — PROTIME-INR
INR: 2.7 — ABNORMAL HIGH (ref 0.8–1.2)
Prothrombin Time: 27.4 seconds — ABNORMAL HIGH (ref 11.4–15.2)

## 2021-03-11 LAB — APTT: aPTT: 88 seconds — ABNORMAL HIGH (ref 24–36)

## 2021-03-11 LAB — MAGNESIUM: Magnesium: 2.3 mg/dL (ref 1.7–2.4)

## 2021-03-11 LAB — SURGICAL PCR SCREEN
MRSA, PCR: NEGATIVE
Staphylococcus aureus: NEGATIVE

## 2021-03-11 MED ORDER — SORBITOL 70 % SOLN
30.0000 mL | Freq: Once | Status: AC
  Administered 2021-03-11: 30 mL via ORAL
  Filled 2021-03-11: qty 30

## 2021-03-11 MED ORDER — MUPIROCIN 2 % EX OINT: 1.0000 "application " | TOPICAL_OINTMENT | Freq: Two times a day (BID) | CUTANEOUS | Status: DC

## 2021-03-11 MED ORDER — MEXILETINE HCL 200 MG PO CAPS
200.0000 mg | ORAL_CAPSULE | Freq: Three times a day (TID) | ORAL | Status: DC
  Administered 2021-03-11 – 2021-03-15 (×13): 200 mg via ORAL
  Filled 2021-03-11 (×19): qty 1

## 2021-03-11 MED ORDER — TAMSULOSIN HCL 0.4 MG PO CAPS
0.4000 mg | ORAL_CAPSULE | Freq: Every day | ORAL | Status: DC
  Administered 2021-03-11 – 2021-03-13 (×3): 0.4 mg via ORAL
  Filled 2021-03-11 (×3): qty 1

## 2021-03-11 NOTE — Progress Notes (Signed)
Progress Note   Subjective   No further VT.  Concerns today with urinary retention and constipation.  Inpatient Medications    Scheduled Meds: . amiodarone  400 mg Oral BID  . carvedilol  12.5 mg Oral BID WC  . Chlorhexidine Gluconate Cloth  6 each Topical Daily  . clopidogrel  75 mg Oral Q breakfast  . dapagliflozin propanediol  10 mg Oral Daily  . digoxin  0.125 mg Oral Daily  . DULoxetine  30 mg Oral Daily  . DULoxetine  60 mg Oral QPM  . ezetimibe  10 mg Oral Daily  . isosorbide mononitrate  30 mg Oral Daily  . melatonin  3 mg Oral QHS  . ranolazine  500 mg Oral BID  . sacubitril-valsartan  1 tablet Oral BID  . senna-docusate  1 tablet Oral Daily  . sodium chloride flush  3 mL Intravenous Q12H  . sodium chloride flush  3 mL Intravenous Q12H  . sodium chloride flush  3 mL Intravenous Q12H  . spironolactone  25 mg Oral Daily  . torsemide  60 mg Oral Daily  . Warfarin - Pharmacist Dosing Inpatient   Does not apply q1600   Continuous Infusions: . sodium chloride    . sodium chloride    . lidocaine 1 mg/min (03/11/21 0600)   PRN Meds: sodium chloride, sodium chloride, acetaminophen, magnesium hydroxide, nitroGLYCERIN, ondansetron (ZOFRAN) IV, oxyCODONE-acetaminophen **AND** oxyCODONE, prochlorperazine, sodium chloride flush, sodium chloride flush, traZODone   Vital Signs    Vitals:   03/11/21 0400 03/11/21 0500 03/11/21 0600 03/11/21 0810  BP: 116/68 (!) 119/43  124/68  Pulse: (!) 59 64 85 72  Resp: 17 20 15    Temp:      TempSrc:      SpO2: 99% 96% 98%   Weight:      Height:        Intake/Output Summary (Last 24 hours) at 03/11/2021 0856 Last data filed at 03/11/2021 0615 Gross per 24 hour  Intake 1497.25 ml  Output 3852 ml  Net -2354.75 ml   Filed Weights   03/02/21 1241 03/05/21 0418 03/10/21 0535  Weight: 87.2 kg 87.7 kg 87.3 kg    Telemetry    sinus - Personally Reviewed  Physical Exam   GEN- The patient is well appearing, less anxious,   alert and oriented x 3 today.   Head- normocephalic, atraumatic Eyes-  Sclera clear, conjunctiva pink Ears- hearing intact Oropharynx- clear Neck- supple, Lungs-  normal work of breathing Heart- Regular rate and rhythm  GI- soft  Extremities- no clubbing, cyanosis, or edema  MS- no significant deformity or atrophy Skin- no rash or lesion Psych- euthymic mood, full affect Neuro- strength and sensation are intact   Labs    Chemistry Recent Labs  Lab 03/09/21 0333 03/10/21 0047 03/11/21 0142  NA 137 136 136  K 4.2 4.6 4.9  CL 104 101 102  CO2 26 27 27   GLUCOSE 132* 107* 98  BUN 17 19 14   CREATININE 0.84 0.85 0.87  CALCIUM 8.7* 8.5* 8.9  GFRNONAA >60 >60 >60  ANIONGAP 7 8 7      Hematology Recent Labs  Lab 03/09/21 0333 03/10/21 0047 03/11/21 0142  WBC 12.5* 9.3 9.8  RBC 4.39 4.15 4.41  HGB 13.2 12.4 13.6  HCT 40.4 38.4 39.9  MCV 92.0 92.5 90.5  MCH 30.1 29.9 30.8  MCHC 32.7 32.3 34.1  RDW 13.0 13.2 13.4  PLT 96* 111* 108*     Patient ID  [name] [name] a 60 y.o.femalewith a history of of CAD (PCI of LCx in Jan 2022), ICM (s/pBoston Scientific dual chamber ICD implant 2017), cocaine abuse (in remission), CVA, AFIB, antiphospholipid syndrome (on Coumadin), who presented to the hospital for worsening SOB and 4 shocks from her ICDwho is being seen today for the evaluation of recurrent VTat the request of Dr. Shirlee Latch.  Assessment & Plan    1.  VT Quiescent overnight Stop IV lidocaine this am and restart mexiletine Increase coreg to 25mg  BID tomorrow if HR and BP allow.  ICD interrogated and reprogrammed yesterday (see addendum to my note yesterday)  May be able to proceed with DFT testing and possible system revision tomorrow depending on INR and any further arrhythmias.  Will make NPO after midnight tonight and discuss with Dr in AM.  May also be reasonable to defer until she is clinically more stable.  2. Anxiety Home cimbalta resumed  yesterday Xanax on hold by fellow due to urinary retention yesterday.  3. Urinary retention Unclear etiology Xanax on hold I agree with fellow thoughts Will need to follow this closely  4. afib Remains in sinus INR therapeutic  5. Antiphospholipid antibody syndrome INR therapeutic today  6. Chronic systolic dysfunction/ ischemic CM/ CAD No ischemic symptoms No new findings on recent cath to explain VT Follows with CHF team  Ep to follow closely  June MD, [facility] 03/11/2021 8:56 AM

## 2021-03-11 NOTE — Progress Notes (Signed)
Pt complains of not being able to void again, bladder scan showed >999. Dr. Early Osmond called, advised to in & out again.

## 2021-03-11 NOTE — Progress Notes (Addendum)
Paged with new onset urinary retention. Significant UOP yesterday without obvious issue. No IO cath needed. Patient with >900 cc out on IO cath. Similar this morning with no significant output but increasing bladder scans, most recent now with significant retention. Will try IO cath x1 more time. If persistent issues will need to place foley and figure out what is causing retention. Reviewed meds and new start meds would be xanax and duloxetine. Xanax can cause anticholinergic side effects, duloxetine less so. Will d/c xanax to see if any improvement.

## 2021-03-11 NOTE — Plan of Care (Signed)

## 2021-03-11 NOTE — Progress Notes (Addendum)
Patient ID: [name] [name], female   DOB: 03/22/1961, 60 y.o.   MRN: [id number]     Advanced Heart Failure Rounding Note  PCP-Cardiologist: Nona Dell, MD   Subjective:    - Admitted  for ICD shocks x 4 for VT. - 3/7 had RHC/LHC with stable coronaries, low filling pressures, and preserved cardiac output. Post cath torsemide cut back. - 3/10 S/P VT ablation  - 3/12 VT x 20 mins. Switched to lidocaine.   No VT overnight. Has recurrent urinary retention with 900 cc out on I/O. Xanax stopped due to possible anitcholinergic effect. Foley placed this am Remains constipated as well.   EP has seen. Lido switched back to mexilitene. Carvedilol increased and ICD reprogrammed.  Bival stopped with INR 2.7  No CP, sob, orthopnea or PND.    Objective:   Weight Range: 87.3 kg Body mass index is 32.03 kg/m.   Vital Signs:   Temp:  [97.6 F (36.4 C)-98.2 F (36.8 C)] 97.7 F (36.5 C) (03/13 0322) Pulse Rate:  [39-96] 72 (03/13 0810) Resp:  [11-27] 15 (03/13 0600) BP: (83-137)/(40-80) 124/68 (03/13 0810) SpO2:  [90 %-100 %] 98 % (03/13 0600) Last BM Date: 03/05/21  Weight change: Filed Weights   03/02/21 1241 03/05/21 0418 03/10/21 0535  Weight: 87.2 kg 87.7 kg 87.3 kg    Intake/Output:   Intake/Output Summary (Last 24 hours) at 03/11/2021 1006 Last data filed at 03/11/2021 0615 Gross per 24 hour  Intake 1497.25 ml  Output 3852 ml  Net -2354.75 ml      Physical Exam   General:  Lying in bedNo resp difficulty HEENT: normal Neck: supple. no JVD. Carotids 2+ bilat; no bruits. No lymphadenopathy or thryomegaly appreciated. Cor: PMI nondisplaced. Regular rate & rhythm. No rubs, gallops or murmurs. Lungs: clear Abdomen: soft, nontender, nondistended. No hepatosplenomegaly. No bruits or masses. Good bowel sounds. Extremities: no cyanosis, clubbing, rash, edema Neuro: alert & orientedx3, cranial nerves grossly intact. moves all 4 extremities w/o difficulty. Affect  pleasant   Telemetry   NSR 70s  No VT occasional PVCs and couplets Personally reviewed   Labs    CBC Recent Labs    03/10/21 0047 03/11/21 0142  WBC 9.3 9.8  HGB 12.4 13.6  HCT 38.4 39.9  MCV 92.5 90.5  PLT 111* 108*   Basic Metabolic Panel Recent Labs    40/97/35 0047 03/11/21 0142  NA 136 136  K 4.6 4.9  CL 101 102  CO2 27 27  GLUCOSE 107* 98  BUN 19 14  CREATININE 0.85 0.87  CALCIUM 8.5* 8.9  MG  --  2.3   Liver Function Tests No results for input(s): AST, ALT, ALKPHOS, BILITOT, PROT, ALBUMIN in the last 72 hours. No results for input(s): LIPASE, AMYLASE in the last 72 hours. Cardiac Enzymes No results for input(s): CKTOTAL, CKMB, CKMBINDEX, TROPONINI in the last 72 hours.  BNP: BNP (last 3 results) Recent Labs    12/14/20 1921 02/27/21 1605 03/01/21 2125  BNP 209.0* 320.5* 194.0*    ProBNP (last 3 results) No results for input(s): PROBNP in the last 8760 hours.   D-Dimer No results for input(s): DDIMER in the last 72 hours. Hemoglobin A1C No results for input(s): HGBA1C in the last 72 hours. Fasting Lipid Panel No results for input(s): CHOL, HDL, LDLCALC, TRIG, CHOLHDL, LDLDIRECT in the last 72 hours. Thyroid Function Tests No results for input(s): TSH, T4TOTAL, T3FREE, THYROIDAB in the last 72 hours.  Invalid input(s): FREET3  Other results:  Imaging    No results found.   Medications:     Scheduled Medications: . amiodarone  400 mg Oral BID  . carvedilol  12.5 mg Oral BID WC  . Chlorhexidine Gluconate Cloth  6 each Topical Daily  . clopidogrel  75 mg Oral Q breakfast  . dapagliflozin propanediol  10 mg Oral Daily  . digoxin  0.125 mg Oral Daily  . DULoxetine  30 mg Oral Daily  . DULoxetine  60 mg Oral QPM  . ezetimibe  10 mg Oral Daily  . isosorbide mononitrate  30 mg Oral Daily  . melatonin  3 mg Oral QHS  . mexiletine  200 mg Oral Q8H  . ranolazine  500 mg Oral BID  . sacubitril-valsartan  1 tablet Oral BID  .  senna-docusate  1 tablet Oral Daily  . sodium chloride flush  3 mL Intravenous Q12H  . sodium chloride flush  3 mL Intravenous Q12H  . sodium chloride flush  3 mL Intravenous Q12H  . spironolactone  25 mg Oral Daily  . torsemide  60 mg Oral Daily  . Warfarin - Pharmacist Dosing Inpatient   Does not apply q1600    Infusions: . sodium chloride    . sodium chloride      PRN Medications: sodium chloride, sodium chloride, acetaminophen, magnesium hydroxide, nitroGLYCERIN, ondansetron (ZOFRAN) IV, oxyCODONE-acetaminophen **AND** oxyCODONE, prochlorperazine, sodium chloride flush, sodium chloride flush, traZODone   Assessment/Plan   1. VT: Suspect scar-mediated VT, no new coronary disease on angiography this admission.  Prior VT in 1/22.  Had PCI to LCx in 1/22. HS-TnI up to 3070 but appears to be demand ischemia with VT and shocks x 4 rather than true ACS as no new disease on coronary angiography. Shocks by ICD were ineffective. Recurrent VT 3/12 in am. Lido started - S/P VT ablation 3/10.  - Continue  amiodarone 400 mg bid and ranolazine - EP has seen and switched lido back to mexilitene  - D/w EP who is seeing today.  - Keep K > 4.0 Mg > 2.0 - Plan noted for possible DFTs tomorrow +/- ICD system revision given ineffective shocks.  2. CAD: MI in 2008 in [facility], cath at the time showed totally occluded diagonal, totally occluded LCX, and diffuse LAD disease.  No intervention. LHC was done in 12/21 due to VT, this showed chronically occluded D1 and 95% proximal LCx.  Initially, no PCI due to thrombocytopenia.  She eventually had PCI to proximal LCx in 1/22.  Elevated troponin appears to be demand ischemic as no new CAD noted on coronary angiography this admission.  No chest pain. - Continue Zetia, has not tolerated statins.  Good lipids in 12/21.   - No s/s angina - Continue Plavix 75 mg daily x at least 6 months post PCI and ideally 1 year. - She is off ASA given use of warfarin and Plavix.  3.  Atrial fibrillation: TEE-guided DCCV to NSR in 12/21.  She is in NSR. No breakthrough Afib on device interrogation.  - continue amiodarone  - If atrial fibrillation recurs, will need to consider Tikosyn versus ablation.  - INR 2.7. Bival stopped 4. Carotid stenosis: Known RICA occlusion. 03/05/21 dopplers with TO RICA, 1-39% LICA.   5. 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  - She is on coumadin INR therapeutic 6. Chronic systolic CHF: Ischemic cardiomyopathy.  [employer] ICD.  Echo (8/21) with EF 15-20%, anteroseptal AK, RV normal, severe LAE, mild MR.  TEE (12/21) with  EF 25-30%.  RHC in 12/21 with preserved cardiac output and optimized filling pressures.  NYHA class II symptoms, not volume overloaded on exam.  RHC showed preserved cardiac output and low filling pressures.  - Volume status much improved with IV lasix. Resume torsemide - Continue Entresto 24-26 bid  - Continue Farxiga 10 mg daily.   - Continue spironolactone 25 mg daily.  - Continue Coreg 6.25 mg bid.  - Continue digoxin, level ok.  - Follow lytes and renal function  7. Smoking: Active smoker, wants to stop on her own.  8. PAD: She denies claudication.  - Needs to quit smoking.  9. 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  Plts have dropped in hospital in past, not thought to be HIT.  Platelets back up to 111k today - She will see Dr. Truett Perna (hematology).  10. Mitral regurgitation: Moderate on 12/21 TEE, suspect functional.  11. 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 EP following 12. Urinary retention - attributed to Xanax. It has been stopped - start Flomax - Foley in place. Will need voiding trials   13. Constipation - sorbitol  Length of Stay: 9  [name], MD  03/11/2021, 10:06 AM  Advanced Heart Failure Team Pager [phone] (M-F; 7a - 5p)  Please contact [facility] for night-coverage after hours (4p -7a ) and weekends  on [email or web address]

## 2021-03-11 NOTE — Progress Notes (Addendum)
Pt complained to RN that "I feel like I have to pee but can not go, and I have been going just fine". RN bladder scanned pt, results shoed >684ml of urine. RN in7out cath the patient and obtained of urine

## 2021-03-11 NOTE — Progress Notes (Addendum)
Called at bedside, patient c/o unable to void, scan showed bladder volume > 500. In and out again obtained of urine. MD notified. Received order for foley insertion.Marland Kitchen

## 2021-03-11 NOTE — Progress Notes (Addendum)
ANTICOAGULATION CONSULT NOTE   Pharmacy Consult for Warfarin and Bivalirudin Indication: persistent AFib, anticardiolipin syndrome  Allergies  Allergen Reactions  . Beef-Derived Products Anaphylaxis    From tick bite  . Metoclopramide Hcl Anaphylaxis    Non responsive  . Penicillins Anaphylaxis    Pt states she can take any cephalosporin (per pt. 01/01/19) Shock Has patient had a PCN reaction causing immediate rash, facial/tongue/throat swelling, SOB or lightheadedness with hypotension: Yes Has patient had a PCN reaction causing severe rash involving mucus membranes or skin necrosis: No Has patient had a PCN reaction that required hospitalization Yes Has patient had a PCN reaction occurring within the last 10 years: No If all of the above answers are "NO", then may proceed with Cephalosporin   . Pork-Derived Products Anaphylaxis    From tick bite. Has tolerated heparin/lovenox  . Metoprolol Other (See Comments)    Syncope   . Reglan [Metoclopramide]   . Statins Other (See Comments)    Myalgias    Patient Measurements: Height: 5\' 5"  (165.1 cm) Weight: 87.3 kg (192 lb 7.4 oz) IBW/kg (Calculated) : 57 Heparin dosing weight: 76 kg  Vital Signs: Temp: 97.7 F (36.5 C) (03/13 0322) Temp Source: Axillary (03/13 0322) BP: 124/68 (03/13 0810) Pulse Rate: 72 (03/13 0810)  Labs: Recent Labs    03/09/21 0333 03/09/21 1608 03/10/21 0047 03/10/21 0759 03/10/21 1302 03/11/21 0142  HGB 13.2  --  12.4  --   --  13.6  HCT 40.4  --  38.4  --   --  39.9  PLT 96*  --  111*  --   --  108*  APTT  --    < > 41* 79* 79* 88*  LABPROT 13.2  --  18.2*  --   --  27.4*  INR 1.0  --  1.6*  --   --  2.7*  HEPARINUNFRC 0.34  --   --   --   --   --   CREATININE 0.84  --  0.85  --   --  0.87   < > = values in this interval not displayed.    Estimated Creatinine Clearance: 77.8 mL/min (by C-G formula based on SCr of 0.87 mg/dL).   Medical History: Past Medical History:  Diagnosis Date   . Aneurysm of internal carotid artery   . Anticardiolipin antibody positive    Chronic Coumadin  . Brain aneurysm    followed by Dr. 03/13/21  . Chronic combined systolic (congestive) and diastolic (congestive) heart failure (HCC)   . Cocaine abuse in remission (HCC)   . Coronary atherosclerosis of native coronary artery    Previous invasive cardiac testing was [place] a facility in [place].  Occluded diagonal, Diffuse LAD disease, Occluded Cx, and non obstructive RCA.   . Essential hypertension   . Headache   . History of pneumonia   . ICD (implantable cardioverter-defibrillator) in place   . Ischemic cardiomyopathy    LVEF 30-35%  . Mixed hyperlipidemia   . NSVT (nonsustained ventricular tachycardia) (HCC)   . Persistent atrial fibrillation (HCC)   . PVD (peripheral vascular disease) (HCC)   . Raynaud's phenomenon   . Statin intolerance   . Stroke [facility]) 2007   Total occlusion of the right internal carotid    Assessment: [name] with history of persistent Afib and anticardiolipin syndrome on warfarin PTA. S/p R/LHC and successful VT ablation, restarted on warfarin and heparin post-ablation. Pharmacy asked to switch heparin to  bivalirudin and to continue warfarin.  Plans for ICD system revision next week, possible Monday. Per EP, goal is for INR to be ~1.7 so that bivalirudin can be stopped safely prior to procedure.   INR now therapeutic but jumped significantly from 1.6>2.7 today, received 3 doses of warfarin. Will stop bivalirudin since INR therapeutic and hold warfarin tonight given significant INR increase. Hgb stable 13.6, pltc low stable 108. No bleeding or infusion issues per discussion with nursing and patient.  PTA: 5 mg daily except 10 mg Sun/Thurs INR on admit: 2.1, last dose 3/3  Goal of Therapy:  INR 2-3  Monitor platelets by anticoagulation protocol: Yes   Plan:  Stop bivalirudin Hold warfarin tonight Daily INR, CBC, s/sx bleeding  Tama Headings, PharmD, BCPS PGY2 Cardiology Pharmacy Resident Phone: [phone] 03/11/2021  8:43 AM  Please check [email or web address] for unit-specific pharmacy phone numbers.

## 2021-03-12 DIAGNOSIS — R0602 Shortness of breath: Secondary | ICD-10-CM

## 2021-03-12 LAB — CBC
HCT: 40 % (ref 36.0–46.0)
Hemoglobin: 13.4 g/dL (ref 12.0–15.0)
MCH: 29.8 pg (ref 26.0–34.0)
MCHC: 33.5 g/dL (ref 30.0–36.0)
MCV: 89.1 fL (ref 80.0–100.0)
Platelets: 111 10*3/uL — ABNORMAL LOW (ref 150–400)
RBC: 4.49 MIL/uL (ref 3.87–5.11)
RDW: 13.1 % (ref 11.5–15.5)
WBC: 12.5 10*3/uL — ABNORMAL HIGH (ref 4.0–10.5)
nRBC: 0 % (ref 0.0–0.2)

## 2021-03-12 LAB — LUPUS ANTICOAGULANT
DRVVT: 30.9 s (ref 0.0–47.0)
PTT Lupus Anticoagulant: 37.8 s (ref 0.0–51.9)
Thrombin Time: >150 s — ABNORMAL HIGH (ref 0.0–23.0)
dPT Confirm Ratio: 1.05 Ratio (ref 0.00–1.34)
dPT: 38.4 s (ref 0.0–47.6)

## 2021-03-12 LAB — BASIC METABOLIC PANEL
Anion gap: 11 (ref 5–15)
BUN: 11 mg/dL (ref 6–20)
CO2: 26 mmol/L (ref 22–32)
Calcium: 9.1 mg/dL (ref 8.9–10.3)
Chloride: 97 mmol/L — ABNORMAL LOW (ref 98–111)
Creatinine, Ser: 0.89 mg/dL (ref 0.44–1.00)
GFR, Estimated: >60 mL/min (ref >60)
Glucose, Bld: 121 mg/dL — ABNORMAL HIGH (ref 70–99)
Potassium: 4.5 mmol/L (ref 3.5–5.1)
Sodium: 134 mmol/L — ABNORMAL LOW (ref 135–145)

## 2021-03-12 LAB — TT MIX+TTN
THROMBIN NEUTRALIZATION: 21.7 s (ref 0.0–23.0)
THROMBIN TIME MIX: 73.5 s — ABNORMAL HIGH (ref 0.0–23.0)

## 2021-03-12 LAB — APTT
aPTT: 98 seconds — ABNORMAL HIGH (ref 24–36)
aPTT: 95 seconds — ABNORMAL HIGH (ref 24–36)

## 2021-03-12 LAB — PROTIME-INR
INR: 1.7 — ABNORMAL HIGH (ref 0.8–1.2)
Prothrombin Time: 19.6 seconds — ABNORMAL HIGH (ref 11.4–15.2)

## 2021-03-12 MED ORDER — SODIUM CHLORIDE 0.9 % IV SOLN
0.2000 mg/kg/h | INTRAVENOUS | Status: DC
  Filled 2021-03-12: qty 250

## 2021-03-12 MED ORDER — FLEET ENEMA 7-19 GM/118ML RE ENEM
1.0000 | ENEMA | Freq: Once | RECTAL | Status: AC
  Administered 2021-03-12: 1 via RECTAL
  Filled 2021-03-12: qty 1

## 2021-03-12 MED ORDER — WARFARIN SODIUM 5 MG PO TABS
10.0000 mg | ORAL_TABLET | Freq: Once | ORAL | Status: AC
  Administered 2021-03-12: 10 mg via ORAL
  Filled 2021-03-12: qty 2

## 2021-03-12 MED ORDER — CHLORHEXIDINE GLUCONATE 4 % EX LIQD
CUTANEOUS | Status: AC
  Filled 2021-03-12: qty 15

## 2021-03-12 MED ORDER — SENNOSIDES-DOCUSATE SODIUM 8.6-50 MG PO TABS
2.0000 | ORAL_TABLET | Freq: Two times a day (BID) | ORAL | Status: DC
  Administered 2021-03-12 – 2021-03-13 (×4): 2 via ORAL
  Filled 2021-03-12 (×3): qty 2

## 2021-03-12 MED ORDER — SORBITOL 70 % SOLN
30.0000 mL | Freq: Once | Status: AC
  Administered 2021-03-12: 30 mL via ORAL
  Filled 2021-03-12: qty 30

## 2021-03-12 MED ORDER — SODIUM CHLORIDE 0.9 % IV SOLN
0.1000 mg/kg/h | INTRAVENOUS | Status: AC
  Administered 2021-03-12: 0.2 mg/kg/h via INTRAVENOUS
  Administered 2021-03-13: 0.1 mg/kg/h via INTRAVENOUS
  Filled 2021-03-12 (×4): qty 250

## 2021-03-12 NOTE — Progress Notes (Signed)
CARDIAC REHAB PHASE I   PRE:  Rate/Rhythm: 72 SR  BP:  Lying: 75/45 --> 75/39    Sitting: 87/76     SaO2: 95 RA  MODE:  Ambulation: transfer to chair  POST:  Rate/Rhythm: 79 SR  BP:  Sitting: 71/43 --> 93/75    SaO2: 96 RA  Pt helped off bedpan, than agreeable to try to ambulate. Pt denied dizziness when lying in the bed. Pt sat on EOB, BP improved. Pt agreeable to ambulation. Upon rising, pt c/o dizziness, and assisted to recliner. BP noted above, raised pts legs and BP retaken. Pt states improvement of dizziness. Encouraged sitting in chair and using BSC with help. Will continue to follow to encourage ambulation as able.  [phone] Reynold Bowen, RN BSN 03/12/2021 2:51 PM

## 2021-03-12 NOTE — Progress Notes (Signed)
Progress Note   Subjective   No further VT.  Feels like her constipation is going to resolve today, "feels like things are moving", passing gas, no belly pain  Inpatient Medications    Scheduled Meds: . amiodarone  400 mg Oral BID  . carvedilol  12.5 mg Oral BID WC  . Chlorhexidine Gluconate Cloth  6 each Topical Daily  . dapagliflozin propanediol  10 mg Oral Daily  . digoxin  0.125 mg Oral Daily  . DULoxetine  30 mg Oral Daily  . DULoxetine  60 mg Oral QPM  . ezetimibe  10 mg Oral Daily  . isosorbide mononitrate  30 mg Oral Daily  . melatonin  3 mg Oral QHS  . mexiletine  200 mg Oral Q8H  . ranolazine  500 mg Oral BID  . sacubitril-valsartan  1 tablet Oral BID  . senna-docusate  1 tablet Oral Daily  . sodium chloride flush  3 mL Intravenous Q12H  . sodium chloride flush  3 mL Intravenous Q12H  . spironolactone  25 mg Oral Daily  . tamsulosin  0.4 mg Oral Daily  . torsemide  60 mg Oral Daily  . Warfarin - Pharmacist Dosing Inpatient   Does not apply q1600   Continuous Infusions: . sodium chloride    . sodium chloride     PRN Meds: sodium chloride, sodium chloride, acetaminophen, magnesium hydroxide, nitroGLYCERIN, ondansetron (ZOFRAN) IV, oxyCODONE-acetaminophen **AND** oxyCODONE, prochlorperazine, sodium chloride flush, traZODone   Vital Signs    Vitals:   03/12/21 0500 03/12/21 0600 03/12/21 0700 03/12/21 0743  BP: (!) 103/57 (!) 112/57 128/62   Pulse: 67 70 66   Resp: 18 16 17    Temp:    98.4 F (36.9 C)  TempSrc:    Oral  SpO2: 95% 96% 95%   Weight:      Height:        Intake/Output Summary (Last 24 hours) at 03/12/2021 0809 Last data filed at 03/12/2021 0600 Gross per 24 hour  Intake 1171.42 ml  Output 3285 ml  Net -2113.58 ml   Filed Weights   03/02/21 1241 03/05/21 0418 03/10/21 0535  Weight: 87.2 kg 87.7 kg 87.3 kg    Telemetry    SR 60's, occ PVC, rare couplet - Personally Reviewed  Physical Exam     GEN- The patient is well  appearing,  alert and oriented x 3 today.   Head- normocephalic, atraumatic Eyes-  Sclera clear, conjunctiva pink Ears- hearing intact Oropharynx- clear Neck- supple, Lungs-  normal work of breathing Heart- Regular rate and rhythm  GI- soft  Extremities- no clubbing, cyanosis, or edema  MS- no significant deformity or atrophy Skin- no rash or lesion Psych- euthymic mood, full affect Neuro- strength and sensation are intact   Labs    Chemistry Recent Labs  Lab 03/10/21 0047 03/11/21 0142 03/12/21 0108  NA 136 136 134*  K 4.6 4.9 4.5  CL 101 102 97*  CO2 27 27 26   GLUCOSE 107* 98 121*  BUN 19 14 11   CREATININE 0.85 0.87 0.89  CALCIUM 8.5* 8.9 9.1  GFRNONAA >60 >60 >60  ANIONGAP 8 7 11      Hematology Recent Labs  Lab 03/10/21 0047 03/11/21 0142 03/12/21 0108  WBC 9.3 9.8 12.5*  RBC 4.15 4.41 4.49  HGB 12.4 13.6 13.4  HCT 38.4 39.9 40.0  MCV 92.5 90.5 89.1  MCH 29.9 30.8 29.8  MCHC 32.3 34.1 33.5  RDW 13.2 13.4 13.1  PLT 111* 108* 111*  Patient ID  [name] Pidgeon Hooveris a 60 y.o.femalewith a history of of CAD (PCI of LCx in Jan 2022), ICM (s/pBoston [employer] dual chamber ICD implant 2017), cocaine abuse (in remission), CVA, AFIB, antiphospholipid syndrome (on Coumadin), who presented to the hospital for worsening SOB and 4 shocks from her ICDwho is being seen today for the evaluation of recurrent VTat the request of Dr. Shirlee Latch.  Assessment & Plan    1.  VT  VT ablation with scar modification 03/08/2021. She had several unstable VT circuits that were treated during the case.  VT again the weekend Quiescent yesterday/last night Off lidocaine gtt > Mexiletine started yesterday Increase coreg to 25mg  BID if HR and BP allow.   Dr. noted: VT with CL of 410 msec,  Morphology is L bundle branch R superior axis with diffusely negative precordial activation (suggesting apical origin) ICD interrogated and reprogrammed by Dr. Johney Frame   added a VT 1 zone  from 130 bpm with ATP x 4 (burst 81%), ATP x 4 (ramp/scan 81%), no shocks. Previous VT zone at 170 bpm was moved to 180 bpm.  All therapies stayed the same.  VF zone remains the same at 200 bpm. Shock lead polarity was changed from reversed to initial.   INR today is 1.7 In d/w Dr. Johney Frame, will plan for Wed, discussed with patient D/w pharmacist,  resume bival today and warfarin OK for therapeutic INR for procedure date Wed, preferably <3.0    2. Anxiety Home cymbalta resumed yesterday Xanax on hold for this. + urine in foley   3. Urinary retention, constipation Unclear etiology Xanax on hold May ask medicine team to help if continues   4. afib Remains in sinus INR sub therapeutic  5. Antiphospholipid antibody syndrome INR 1.7 Off Bival As above, discussed with pharm will resume bival today w/warfain, will need to time INR draw after holding Bival tomorrow She will need to be off Bival tomorrow for lead revision Wed. OK for therapeutic INR   6. Chronic systolic dysfunction/ ischemic CM/ CAD No ischemic symptoms No new findings on recent cath to explain VT C/w CHF team, not felt to be volume OL And will follow Asked we take attending role       June, PA-C 03/12/2021 8:09 AM

## 2021-03-12 NOTE — Progress Notes (Signed)
ANTICOAGULATION CONSULT NOTE   Pharmacy Consult for Warfarin and Bivalirudin Indication: persistent AFib, anticardiolipin syndrome  Allergies  Allergen Reactions  . Beef-Derived Products Anaphylaxis    From tick bite  . Metoclopramide Hcl Anaphylaxis    Non responsive  . Penicillins Anaphylaxis    Pt states she can take any cephalosporin (per pt. 01/01/19) Shock Has patient had a PCN reaction causing immediate rash, facial/tongue/throat swelling, SOB or lightheadedness with hypotension: Yes Has patient had a PCN reaction causing severe rash involving mucus membranes or skin necrosis: No Has patient had a PCN reaction that required hospitalization Yes Has patient had a PCN reaction occurring within the last 10 years: No If all of the above answers are "NO", then may proceed with Cephalosporin   . Pork-Derived Products Anaphylaxis    From tick bite. Has tolerated heparin/lovenox  . Metoprolol Other (See Comments)    Syncope   . Reglan [Metoclopramide]   . Statins Other (See Comments)    Myalgias    Patient Measurements: Height: 5\' 5"  (165.1 cm) Weight: 87.3 kg (192 lb 7.4 oz) IBW/kg (Calculated) : 57 Heparin dosing weight: 76 kg  Vital Signs: Temp: 98.3 F (36.8 C) (03/14 1900) Temp Source: Oral (03/14 1900) BP: 114/53 (03/14 1800) Pulse Rate: 69 (03/14 1800)  Labs: Recent Labs    03/10/21 0047 03/10/21 0759 03/11/21 0142 03/12/21 0108 03/12/21 1233 03/12/21 1828  HGB 12.4  --  13.6 13.4  --   --   HCT 38.4  --  39.9 40.0  --   --   PLT 111*  --  108* 111*  --   --   APTT 41*   < > 88*  --  98* 95*  LABPROT 18.2*  --  27.4* 19.6*  --   --   INR 1.6*  --  2.7* 1.7*  --   --   CREATININE 0.85  --  0.87 0.89  --   --    < > = values in this interval not displayed.    Estimated Creatinine Clearance: 76.1 mL/min (by C-G formula based on SCr of 0.89 mg/dL).   Medical History: Past Medical History:  Diagnosis Date  . Aneurysm of internal carotid artery   .  Anticardiolipin antibody positive    Chronic Coumadin  . Brain aneurysm    followed by Dr. 03/14/21  . Chronic combined systolic (congestive) and diastolic (congestive) heart failure (HCC)   . Cocaine abuse in remission (HCC)   . Coronary atherosclerosis of native coronary artery    Previous invasive cardiac testing was done at a facility in [place], [place].  Occluded diagonal, Diffuse LAD disease, Occluded Cx, and non obstructive RCA.   . Essential hypertension   . Headache   . History of pneumonia   . ICD (implantable cardioverter-defibrillator) in place   . Ischemic cardiomyopathy    LVEF 30-35%  . Mixed hyperlipidemia   . NSVT (nonsustained ventricular tachycardia) (HCC)   . Persistent atrial fibrillation (HCC)   . PVD (peripheral vascular disease) (HCC)   . Raynaud's phenomenon   . Statin intolerance   . 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 S/p R/LHC and successful VT ablation, restarted on warfarin and heparin post-ablation. Pharmacy asked to switch heparin to bivalirudin and to continue warfarin.  Plans for ICD system revision next week, possible Monday. Per EP, goal  is for INR to be ~2  so that bivalirudin can be stopped safely prior to procedure.   INR down to 1.7 this AM off bivalirudin.  Planning for EP procedure Wed.  Asked to resume bivalirudin and coumadin.    Bivalirudin started this AM at 0.2 mg/kg/hr, and aPTT 4 hrs later = 98 (above goal). bivalirudin decreased 0.15mg /kg/hr and recheck aptt 95sec (above goal) Labs drawn correctly from opposite arm - checked personally and discussed with patient   No overt bleeding or infusion issues per discussion with nursing.  PTA: 5 mg daily except 10 mg Sun/Thurs INR on admit: 2.1, last dose 3/3  Goal of Therapy:  INR 2-3 aPTT 50-85 seconds  Monitor platelets by anticoagulation protocol:  Yes   Plan:  Decrease bivalirudin to 0.1 mg/kg/hr (dosing weight: 87.3 kg) Daily aPTT, INR, CBC, s/sx bleeding    Leota Sauers Pharm.D. CPP, BCPS Clinical Pharmacist [phone] 03/12/2021 8:32 PM    [facility] pharmacy phone numbers are listed on [email or web address]

## 2021-03-12 NOTE — Progress Notes (Addendum)
ANTICOAGULATION CONSULT NOTE   Pharmacy Consult for Warfarin and Bivalirudin Indication: persistent AFib, anticardiolipin syndrome  Allergies  Allergen Reactions  . Beef-Derived Products Anaphylaxis    From tick bite  . Metoclopramide Hcl Anaphylaxis    Non responsive  . Penicillins Anaphylaxis    Pt states she can take any cephalosporin (per pt. 01/01/19) Shock Has patient had a PCN reaction causing immediate rash, facial/tongue/throat swelling, SOB or lightheadedness with hypotension: Yes Has patient had a PCN reaction causing severe rash involving mucus membranes or skin necrosis: No Has patient had a PCN reaction that required hospitalization Yes Has patient had a PCN reaction occurring within the last 10 years: No If all of the above answers are "NO", then may proceed with Cephalosporin   . Pork-Derived Products Anaphylaxis    From tick bite. Has tolerated heparin/lovenox  . Metoprolol Other (See Comments)    Syncope   . Reglan [Metoclopramide]   . Statins Other (See Comments)    Myalgias    Patient Measurements: Height: 5\' 5"  (165.1 cm) Weight: 87.3 kg (192 lb 7.4 oz) IBW/kg (Calculated) : 57 Heparin dosing weight: 76 kg  Vital Signs: Temp: 98.1 F (36.7 C) (03/14 1116) Temp Source: Oral (03/14 1116) BP: 149/64 (03/14 0900) Pulse Rate: 69 (03/14 0931)  Labs: Recent Labs    03/10/21 0047 03/10/21 0759 03/10/21 1302 03/11/21 0142 03/12/21 0108 03/12/21 1233  HGB 12.4  --   --  13.6 13.4  --   HCT 38.4  --   --  39.9 40.0  --   PLT 111*  --   --  108* 111*  --   APTT 41*   < > 79* 88*  --  98*  LABPROT 18.2*  --   --  27.4* 19.6*  --   INR 1.6*  --   --  2.7* 1.7*  --   CREATININE 0.85  --   --  0.87 0.89  --    < > = values in this interval not displayed.    Estimated Creatinine Clearance: 76.1 mL/min (by C-G formula based on SCr of 0.89 mg/dL).   Medical History: Past Medical History:  Diagnosis Date  . Aneurysm of internal carotid artery   .  Anticardiolipin antibody positive    Chronic Coumadin  . Brain aneurysm    followed by Dr. 03/14/21  . Chronic combined systolic (congestive) and diastolic (congestive) heart failure (HCC)   . Cocaine abuse in remission (HCC)   . Coronary atherosclerosis of native coronary artery    Previous invasive cardiac testing was done at a facility in [place], [place].  Occluded diagonal, Diffuse LAD disease, Occluded Cx, and non obstructive RCA.   . Essential hypertension   . Headache   . History of pneumonia   . ICD (implantable cardioverter-defibrillator) in place   . Ischemic cardiomyopathy    LVEF 30-35%  . Mixed hyperlipidemia   . NSVT (nonsustained ventricular tachycardia) (HCC)   . Persistent atrial fibrillation (HCC)   . PVD (peripheral vascular disease) (HCC)   . Raynaud's phenomenon   . Statin intolerance   . Stroke [facility]) 2007   Total occlusion of the right internal carotid    Assessment: [name] with history of persistent Afib and anticardiolipin syndrome on warfarin PTA. S/p R/LHC and successful VT ablation, restarted on warfarin and heparin post-ablation. Pharmacy asked to switch heparin to bivalirudin and to continue warfarin.  Plans for ICD system revision next week, possible Monday. Per EP, goal  is for INR to be ~1.7 so that bivalirudin can be stopped safely prior to procedure.   INR down to 1.7 this AM off bivalirudin.  Planning for EP procedure Wed.  Asked to resume bivalirudin and coumadin.    Bivalirudin started this AM at 0.2 mg/kg/hr, and aPTT 4 hrs later = 98 (above goal).   No overt bleeding or infusion issues per discussion with nursing.  PTA: 5 mg daily except 10 mg Sun/Thurs INR on admit: 2.1, last dose 3/3  Goal of Therapy:  INR 2-3 aPTT 50-85 seconds  Monitor platelets by anticoagulation protocol: Yes   Plan:  Decrease bivalirudin to 0.15 mg/kg/hr (dosing weight: 87.3 kg) Give warfarin 10 mg PO x1 tonight Check 4-hr confirmatory aPTT Daily aPTT,  INR, CBC, s/sx bleeding  Reece Leader, Colon Flattery, [facility] Clinical Pharmacist  03/12/2021 2:32 PM   [facility] pharmacy phone numbers are listed on [email or web address]

## 2021-03-12 NOTE — Progress Notes (Addendum)
Patient ID: [name] [name], female   DOB: 02-28-1961, 60 y.o.   MRN: [id number]     Advanced Heart Failure Rounding Note  PCP-Cardiologist: Nona Dell, MD   Subjective:    - Admitted  for ICD shocks x 4 for VT. - 3/7 had RHC/LHC with stable coronaries, low filling pressures, and preserved cardiac output. Post cath torsemide cut back. - 3/10 S/P VT ablation  - 3/12 VT x 20 mins. Switched to lidocaine.   - 3/13 Lido switched back to mexilitene  No VT overnight.  Denies CP or SOB.    Objective:   Weight Range: 87.3 kg Body mass index is 32.03 kg/m.   Vital Signs:   Temp:  [97.7 F (36.5 C)-99.1 F (37.3 C)] 98.4 F (36.9 C) (03/14 0743) Pulse Rate:  [63-82] 66 (03/14 0700) Resp:  [13-22] 17 (03/14 0700) BP: (88-159)/(46-91) 128/62 (03/14 0700) SpO2:  [94 %-100 %] 95 % (03/14 0700) Last BM Date: 03/11/21  Weight change: Filed Weights   03/02/21 1241 03/05/21 0418 03/10/21 0535  Weight: 87.2 kg 87.7 kg 87.3 kg    Intake/Output:   Intake/Output Summary (Last 24 hours) at 03/12/2021 0748 Last data filed at 03/12/2021 0600 Gross per 24 hour  Intake 1411.42 ml  Output 4085 ml  Net -2673.58 ml      Physical Exam   General:  Well appearing. No resp difficulty HEENT: normal Neck: supple. no JVD. Carotids 2+ bilat; no bruits. No lymphadenopathy or thryomegaly appreciated. Cor: PMI nondisplaced. Regular rate & rhythm. No rubs, gallops or murmurs. Lungs: clear Abdomen: soft, nontender, nondistended. No hepatosplenomegaly. No bruits or masses. Good bowel sounds. Extremities: no cyanosis, clubbing, rash, edema Neuro: alert & orientedx3, cranial nerves grossly intact. moves all 4 extremities w/o difficulty. Affect pleasant   Telemetry   NSR 70s occasional PVC   Labs    CBC Recent Labs    03/11/21 0142 03/12/21 0108  WBC 9.8 12.5*  HGB 13.6 13.4  HCT 39.9 40.0  MCV 90.5 89.1  PLT 108* 111*   Basic Metabolic Panel Recent Labs    62/95/28 0142  03/12/21 0108  NA 136 134*  K 4.9 4.5  CL 102 97*  CO2 27 26  GLUCOSE 98 121*  BUN 14 11  CREATININE 0.87 0.89  CALCIUM 8.9 9.1  MG 2.3  --    Liver Function Tests No results for input(s): AST, ALT, ALKPHOS, BILITOT, PROT, ALBUMIN in the last 72 hours. No results for input(s): LIPASE, AMYLASE in the last 72 hours. Cardiac Enzymes No results for input(s): CKTOTAL, CKMB, CKMBINDEX, TROPONINI in the last 72 hours.  BNP: BNP (last 3 results) Recent Labs    12/14/20 1921 02/27/21 1605 03/01/21 2125  BNP 209.0* 320.5* 194.0*    ProBNP (last 3 results) No results for input(s): PROBNP in the last 8760 hours.   D-Dimer No results for input(s): DDIMER in the last 72 hours. Hemoglobin A1C No results for input(s): HGBA1C in the last 72 hours. Fasting Lipid Panel No results for input(s): CHOL, HDL, LDLCALC, TRIG, CHOLHDL, LDLDIRECT in the last 72 hours. Thyroid Function Tests No results for input(s): TSH, T4TOTAL, T3FREE, THYROIDAB in the last 72 hours.  Invalid input(s): FREET3  Other results:   Imaging    No results found.   Medications:     Scheduled Medications: . amiodarone  400 mg Oral BID  . carvedilol  12.5 mg Oral BID WC  . Chlorhexidine Gluconate Cloth  6 each Topical Daily  . dapagliflozin  propanediol  10 mg Oral Daily  . digoxin  0.125 mg Oral Daily  . DULoxetine  30 mg Oral Daily  . DULoxetine  60 mg Oral QPM  . ezetimibe  10 mg Oral Daily  . isosorbide mononitrate  30 mg Oral Daily  . melatonin  3 mg Oral QHS  . mexiletine  200 mg Oral Q8H  . ranolazine  500 mg Oral BID  . sacubitril-valsartan  1 tablet Oral BID  . senna-docusate  1 tablet Oral Daily  . sodium chloride flush  3 mL Intravenous Q12H  . sodium chloride flush  3 mL Intravenous Q12H  . spironolactone  25 mg Oral Daily  . tamsulosin  0.4 mg Oral Daily  . torsemide  60 mg Oral Daily  . Warfarin - Pharmacist Dosing Inpatient   Does not apply q1600    Infusions: . sodium chloride     . sodium chloride      PRN Medications: sodium chloride, sodium chloride, acetaminophen, magnesium hydroxide, nitroGLYCERIN, ondansetron (ZOFRAN) IV, oxyCODONE-acetaminophen **AND** oxyCODONE, prochlorperazine, sodium chloride flush, traZODone   Assessment/Plan   1. VT: Suspect scar-mediated VT, no new coronary disease on angiography this admission.  Prior VT in 1/22.  Had PCI to LCx in 1/22. HS-TnI up to 3070 but appears to be demand ischemia with VT and shocks x 4 rather than true ACS as no new disease on coronary angiography. Shocks by ICD were ineffective. Recurrent VT 3/12 in am. Lido started - S/P VT ablation 3/10.  - Continue  amiodarone 400 mg bid +  Ranolazine + coreg + mexilitene  - D/w EP . - Keep K > 4.0 Mg > 2.0 - Plan noted for possible DFTs  +/- ICD system revision given ineffective shocks.  2. CAD: MI in 2008 in [facility], cath at the time showed totally occluded diagonal, totally occluded LCX, and diffuse LAD disease.  No intervention. LHC was done in 12/21 due to VT, this showed chronically occluded D1 and 95% proximal LCx.  Initially, no PCI due to thrombocytopenia.  She eventually had PCI to proximal LCx in 1/22.  Elevated troponin appears to be demand ischemic as no new CAD noted on coronary angiography this admission.  No chest pain. - Continue Zetia, has not tolerated statins.  Good lipids in 12/21.   - No chest pain.  - Hold plavix.+ warfarin for EP procedure. Restart per EP.  3. Atrial fibrillation: TEE-guided DCCV to NSR in 12/21.  She is in NSR. No breakthrough Afib on device interrogation.  - continue amiodarone  - No A fib.  - If atrial fibrillation recurs, will need to consider Tikosyn versus ablation.  - INR 1.7. 4. Carotid stenosis: Known RICA occlusion. 03/05/21 dopplers with TO RICA, 1-39% LICA.   5. 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  - Hold warfarin as noted above. .  6. Chronic systolic CHF: Ischemic cardiomyopathy.  [employer]  ICD.  Echo (8/21) with EF 15-20%, anteroseptal AK, RV normal, severe LAE, mild MR.  TEE (12/21) with EF 25-30%.  RHC in 12/21 with preserved cardiac output and optimized filling pressures.  NYHA class II symptoms, not volume overloaded on exam.  RHC showed preserved cardiac output and low filling pressures.  - Volume status stable. Continue torsemide 60 mg daily.  - Continue Entresto 24-26 bid  - Continue Farxiga 10 mg daily.   - Continue spironolactone 25 mg daily.  - Continue Coreg 12.5 mg twice a day.   - Continue digoxin, level ok.  7. Smoking: Active smoker, wants to stop on her own.  8. PAD: She denies claudication.  - Needs to quit smoking.  9. 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  Plts have dropped in hospital in past, not thought to be HIT.  Platelets back up to 111k today - She will see Dr. Truett Perna (hematology).  10. Mitral regurgitation: Moderate on 12/21 TEE, suspect functional.  11. 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 EP following 12. Urinary retention - attributed to Xanax. It has been stopped - Started flomax on 3/13 - Foley in place. Will need voiding trials  13. Constipation - sorbitol   Holding anticoagulants with EP procedure. If procedure cancelled will restart bival + coumadin.   Length of Stay: 10  Tonye Becket, NP  03/12/2021, 7:48 AM  Advanced Heart Failure Team Pager [phone] (M-F; 7a - 5p)  Please contact [facility] for night-coverage after hours (4p -7a ) and weekends on [email or web address]  Patient seen and examined with the above-signed Advanced Practice Provider and/or Housestaff. I personally reviewed laboratory data, imaging studies and relevant notes. I independently examined the patient and formulated the important aspects of the plan. I have edited the note to reflect any of my changes or salient points. I have personally discussed the plan with the patient and/or family.  Doing well this am. On  po amio and mexilitene. No further VT overnight. INR down to 1.7.   Foley in place for urinary retention will need voiding trials this week. Volume status stable.   General:  Well appearing. No resp difficulty HEENT: normal Neck: supple. no JVD. Carotids 2+ bilat; no bruits. No lymphadenopathy or thryomegaly appreciated. Cor: PMI nondisplaced. Regular rate & rhythm. No rubs, gallops or murmurs. Lungs: clear Abdomen: soft, nontender, nondistended. No hepatosplenomegaly. No bruits or masses. Good bowel sounds. Extremities: no cyanosis, clubbing, rash, edema Neuro: alert & orientedx3, cranial nerves grossly intact. moves all 4 extremities w/o difficulty. Affect pleasant  HF is stable. No further VT on po amio and mexilitene. Spoke with EP - no plans for DFT until Wednesday. Continue current HF regimen. Discussed AC with PharmD team.  Can repeat sorbitol for constipation.   Will ask EP to assume care.   [name], MD  8:22 AM

## 2021-03-13 ENCOUNTER — Ambulatory Visit: Payer: Medicare PPO | Admitting: Oncology

## 2021-03-13 ENCOUNTER — Other Ambulatory Visit: Payer: Medicare PPO

## 2021-03-13 DIAGNOSIS — I4891 Unspecified atrial fibrillation: Secondary | ICD-10-CM

## 2021-03-13 DIAGNOSIS — F419 Anxiety disorder, unspecified: Secondary | ICD-10-CM

## 2021-03-13 LAB — CBC
HCT: 38.2 % (ref 36.0–46.0)
Hemoglobin: 12.9 g/dL (ref 12.0–15.0)
MCH: 30.1 pg (ref 26.0–34.0)
MCHC: 33.8 g/dL (ref 30.0–36.0)
MCV: 89 fL (ref 80.0–100.0)
Platelets: 124 10*3/uL — ABNORMAL LOW (ref 150–400)
RBC: 4.29 MIL/uL (ref 3.87–5.11)
RDW: 13.3 % (ref 11.5–15.5)
WBC: 10.7 10*3/uL — ABNORMAL HIGH (ref 4.0–10.5)
nRBC: 0 % (ref 0.0–0.2)

## 2021-03-13 LAB — BASIC METABOLIC PANEL
Anion gap: 7 (ref 5–15)
BUN: 14 mg/dL (ref 6–20)
CO2: 30 mmol/L (ref 22–32)
Calcium: 8.6 mg/dL — ABNORMAL LOW (ref 8.9–10.3)
Chloride: 96 mmol/L — ABNORMAL LOW (ref 98–111)
Creatinine, Ser: 1 mg/dL (ref 0.44–1.00)
GFR, Estimated: >60 mL/min (ref >60)
Glucose, Bld: 98 mg/dL (ref 70–99)
Potassium: 3.7 mmol/L (ref 3.5–5.1)
Sodium: 133 mmol/L — ABNORMAL LOW (ref 135–145)

## 2021-03-13 LAB — APTT
aPTT: 83 seconds — ABNORMAL HIGH (ref 24–36)
aPTT: 80 seconds — ABNORMAL HIGH (ref 24–36)

## 2021-03-13 LAB — PROTIME-INR
INR: 2.5 — ABNORMAL HIGH (ref 0.8–1.2)
Prothrombin Time: 26 seconds — ABNORMAL HIGH (ref 11.4–15.2)

## 2021-03-13 MED ORDER — BISACODYL 5 MG PO TBEC
5.0000 mg | DELAYED_RELEASE_TABLET | Freq: Every day | ORAL | Status: DC | PRN
  Administered 2021-03-13: 5 mg via ORAL
  Filled 2021-03-13: qty 1

## 2021-03-13 MED ORDER — POTASSIUM CHLORIDE CRYS ER 20 MEQ PO TBCR
40.0000 meq | EXTENDED_RELEASE_TABLET | Freq: Once | ORAL | Status: AC
  Administered 2021-03-13: 40 meq via ORAL
  Filled 2021-03-13: qty 2

## 2021-03-13 MED ORDER — SODIUM CHLORIDE 0.9 % IV SOLN: 250.0000 mL | INTRAVENOUS | Status: DC

## 2021-03-13 MED ORDER — CHLORHEXIDINE GLUCONATE 4 % EX LIQD
60.0000 mL | Freq: Once | CUTANEOUS | Status: AC
  Administered 2021-03-13: 4 via TOPICAL
  Filled 2021-03-13: qty 15

## 2021-03-13 MED ORDER — SODIUM CHLORIDE 0.9% FLUSH: 3.0000 mL | INTRAVENOUS | Status: DC | PRN

## 2021-03-13 MED ORDER — SODIUM CHLORIDE 0.9 % IV SOLN
80.0000 mg | INTRAVENOUS | Status: DC
  Filled 2021-03-13: qty 2

## 2021-03-13 MED ORDER — SODIUM CHLORIDE 0.9 % IV SOLN
INTRAVENOUS | Status: DC
Start: 2021-03-14 — End: 2021-03-14

## 2021-03-13 MED ORDER — SODIUM CHLORIDE 0.9% FLUSH
3.0000 mL | Freq: Two times a day (BID) | INTRAVENOUS | Status: DC
  Administered 2021-03-13 – 2021-03-15 (×4): 3 mL via INTRAVENOUS

## 2021-03-13 MED ORDER — WARFARIN SODIUM 7.5 MG PO TABS
7.5000 mg | ORAL_TABLET | Freq: Once | ORAL | Status: AC
  Administered 2021-03-13: 7.5 mg via ORAL
  Filled 2021-03-13: qty 1

## 2021-03-13 MED ORDER — SODIUM CHLORIDE 0.9% FLUSH
3.0000 mL | Freq: Two times a day (BID) | INTRAVENOUS | Status: DC
  Administered 2021-03-13: 3 mL via INTRAVENOUS

## 2021-03-13 MED ORDER — CHLORHEXIDINE GLUCONATE 4 % EX LIQD
60.0000 mL | Freq: Once | CUTANEOUS | Status: AC
  Administered 2021-03-14: 4 via TOPICAL
  Filled 2021-03-13: qty 60

## 2021-03-13 MED ORDER — VANCOMYCIN HCL 1000 MG/200ML IV SOLN
1000.0000 mg | INTRAVENOUS | Status: DC
  Filled 2021-03-13: qty 200

## 2021-03-13 MED ORDER — CHLORHEXIDINE GLUCONATE 4 % EX LIQD: 60.0000 mL | Freq: Once | CUTANEOUS | Status: DC

## 2021-03-13 NOTE — Progress Notes (Addendum)
Patient ID: ELARIA OSIAS, female   DOB: Nov 22, 1961, 60 y.o.   MRN: [id number]     Advanced Heart Failure Rounding Note  PCP-Cardiologist: Nona Dell, MD   Subjective:    - Admitted  for ICD shocks x 4 for VT. - 3/7 had RHC/LHC with stable coronaries, low filling pressures, and preserved cardiac output. Post cath torsemide cut back. - 3/10 S/P VT ablation  - 3/12 VT x 20 mins. Switched to lidocaine.   - 3/13 Lido switched back to mexilitene  Had BM 3/14   Feels better today. Denies SOB    Objective:   Weight Range: 90.2 kg Body mass index is 33.09 kg/m.   Vital Signs:   Temp:  [97.9 F (36.6 C)-98.7 F (37.1 C)] 98 F (36.7 C) (03/15 0700) Pulse Rate:  [58-74] 62 (03/15 0700) Resp:  [12-22] 15 (03/15 0700) BP: (84-149)/(42-94) 120/50 (03/15 0700) SpO2:  [93 %-99 %] 99 % (03/15 0700) Weight:  [90.2 kg] 90.2 kg (03/15 0600) Last BM Date: 03/12/21  Weight change: Filed Weights   03/05/21 0418 03/10/21 0535 03/13/21 0600  Weight: 87.7 kg 87.3 kg 90.2 kg    Intake/Output:   Intake/Output Summary (Last 24 hours) at 03/13/2021 0848 Last data filed at 03/13/2021 0600 Gross per 24 hour  Intake 835.46 ml  Output 4215 ml  Net -3379.54 ml      Physical Exam   General:  Well appearing. No resp difficulty HEENT: normal Neck: supple. no JVD. Carotids 2+ bilat; no bruits. No lymphadenopathy or thryomegaly appreciated. Cor: PMI nondisplaced. Regular rate & rhythm. No rubs, gallops or murmurs. Lungs: clear Abdomen: soft, nontender, nondistended. No hepatosplenomegaly. No bruits or masses. Good bowel sounds. Extremities: no cyanosis, clubbing, rash, edema Neuro: alert & orientedx3, cranial nerves grossly intact. moves all 4 extremities w/o difficulty. Affect pleasant GU: foley   Telemetry   NSR 70s with occasional PVCs.    Labs    CBC Recent Labs    03/12/21 0108 03/13/21 0035  WBC 12.5* 10.7*  HGB 13.4 12.9  HCT 40.0 38.2  MCV 89.1 89.0  PLT 111*  124*   Basic Metabolic Panel Recent Labs    77/82/42 0142 03/12/21 0108 03/13/21 0035  NA 136 134* 133*  K 4.9 4.5 3.7  CL 102 97* 96*  CO2 27 26 30   GLUCOSE 98 121* 98  BUN 14 11 14   CREATININE 0.87 0.89 1.00  CALCIUM 8.9 9.1 8.6*  MG 2.3  --   --    Liver Function Tests No results for input(s): AST, ALT, ALKPHOS, BILITOT, PROT, ALBUMIN in the last 72 hours. No results for input(s): LIPASE, AMYLASE in the last 72 hours. Cardiac Enzymes No results for input(s): CKTOTAL, CKMB, CKMBINDEX, TROPONINI in the last 72 hours.  BNP: BNP (last 3 results) Recent Labs    12/14/20 1921 02/27/21 1605 03/01/21 2125  BNP 209.0* 320.5* 194.0*    ProBNP (last 3 results) No results for input(s): PROBNP in the last 8760 hours.   D-Dimer No results for input(s): DDIMER in the last 72 hours. Hemoglobin A1C No results for input(s): HGBA1C in the last 72 hours. Fasting Lipid Panel No results for input(s): CHOL, HDL, LDLCALC, TRIG, CHOLHDL, LDLDIRECT in the last 72 hours. Thyroid Function Tests No results for input(s): TSH, T4TOTAL, T3FREE, THYROIDAB in the last 72 hours.  Invalid input(s): FREET3  Other results:   Imaging    No results found.   Medications:     Scheduled Medications: . amiodarone  400 mg Oral BID  . carvedilol  12.5 mg Oral BID WC  . Chlorhexidine Gluconate Cloth  6 each Topical Daily  . dapagliflozin propanediol  10 mg Oral Daily  . digoxin  0.125 mg Oral Daily  . DULoxetine  30 mg Oral Daily  . DULoxetine  60 mg Oral QPM  . ezetimibe  10 mg Oral Daily  . isosorbide mononitrate  30 mg Oral Daily  . melatonin  3 mg Oral QHS  . mexiletine  200 mg Oral Q8H  . ranolazine  500 mg Oral BID  . sacubitril-valsartan  1 tablet Oral BID  . senna-docusate  2 tablet Oral BID  . sodium chloride flush  3 mL Intravenous Q12H  . sodium chloride flush  3 mL Intravenous Q12H  . sodium chloride flush  3 mL Intravenous Q12H  . sodium chloride flush  3 mL  Intravenous Q12H  . spironolactone  25 mg Oral Daily  . tamsulosin  0.4 mg Oral Daily  . torsemide  60 mg Oral Daily  . Warfarin - Pharmacist Dosing Inpatient   Does not apply q1600    Infusions: . sodium chloride    . sodium chloride    . bivalirudin (ANGIOMAX) infusion 0.5 mg/mL (Non-ACS indications) 0.1 mg/kg/hr (03/13/21 0637)    PRN Medications: sodium chloride, sodium chloride, acetaminophen, magnesium hydroxide, nitroGLYCERIN, ondansetron (ZOFRAN) IV, oxyCODONE-acetaminophen **AND** oxyCODONE, prochlorperazine, sodium chloride flush, traZODone   Assessment/Plan   1. VT: Suspect scar-mediated VT, no new coronary disease on angiography this admission.  Prior VT in 1/22.  Had PCI to LCx in 1/22. HS-TnI up to 3070 but appears to be demand ischemia with VT and shocks x 4 rather than true ACS as no new disease on coronary angiography. Shocks by ICD were ineffective.  - S/P VT ablation 3/10.  -Recurrent VT 3/12 in am. Lido started/  - Continue  amiodarone 400 mg bid +  Ranolazine + coreg + mexilitene  - D/w EP . - Keep K > 4.0 Mg > 2.0 - 3/16-->Plan for DFTs  +/- ICD system revision given ineffective shocks.  2. CAD: MI in 2008 in [facility], cath at the time showed totally occluded diagonal, totally occluded LCX, and diffuse LAD disease.  No intervention. LHC was done in 12/21 due to VT, this showed chronically occluded D1 and 95% proximal LCx.  Initially, no PCI due to thrombocytopenia.  She eventually had PCI to proximal LCx in 1/22.  Elevated troponin appears to be demand ischemic as no new CAD noted on coronary angiography this admission.  No chest pain. - Continue Zetia, has not tolerated statins.  Good lipids in 12/21.   - No chest pain. - Hold plavix.+ warfarin for EP procedure. Restart per EP.  3. Atrial fibrillation: TEE-guided DCCV to NSR in 12/21.  She is in NSR. No breakthrough Afib on device interrogation.  - continue amiodarone  - No A fib.  - If atrial fibrillation recurs, will  need to consider Tikosyn versus ablation.  - INR 2.5  4. Carotid stenosis: Known RICA occlusion. 03/05/21 dopplers with TO RICA, 1-39% LICA.   5. 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  - Hold warfarin as noted above. .  6. Chronic systolic CHF: Ischemic cardiomyopathy.  [employer] ICD.  Echo (8/21) with EF 15-20%, anteroseptal AK, RV normal, severe LAE, mild MR.  TEE (12/21) with EF 25-30%.  RHC in 12/21 with preserved cardiac output and optimized filling pressures.  NYHA class II symptoms, not volume overloaded  on exam.  RHC showed preserved cardiac output and low filling pressures.  -Volume status stable. Continue torsemide 60 mg daily.  - Continue Entresto 24-26 bid  - Continue Farxiga 10 mg daily.   - Continue spironolactone 25 mg daily.  - Continue Coreg 12.5 mg twice a day.   - Continue digoxin, level ok.  7. Smoking: Active smoker, wants to stop on her own.  8. PAD: She denies claudication.  - Needs to quit smoking.  9. 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  Plts have dropped in hospital in past, not thought to be HIT.  Platelets back up to 124k today - She will see Dr. Truett Perna (hematology).  10. Mitral regurgitation: Moderate on 12/21 TEE, suspect functional.  11. 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 EP following 12. Urinary retention - attributed to Xanax. It has been stopped - Started flomax on 3/13 - Foley in place. Removing today.  13. Constipation - Resolved.    Length of Stay: 11  Amy Clegg, NP  03/13/2021, 8:48 AM  Advanced Heart Failure Team Pager [phone] (M-F; 7a - 5p)  Please contact [facility] for night-coverage after hours (4p -7a ) and weekends on [email or web address]  Patient seen and examined with the above-signed Advanced Practice Provider and/or Housestaff. I personally reviewed laboratory data, imaging studies and relevant notes. I independently examined the patient and  formulated the important aspects of the plan. I have edited the note to reflect any of my changes or salient points. I have personally discussed the plan with the patient and/or family.  Feels much better after BM. No further VT on po amio and mexilitene. Volume status lookks good. No SOB. Plan for voiding trial today. DFTs and possible device modification tomorrow.   General:  Sitting up in bed No resp difficulty HEENT: normal Neck: supple. no JVD. Carotids 2+ bilat; no bruits. No lymphadenopathy or thryomegaly appreciated. Cor: PMI nondisplaced. Regular rate & rhythm. No rubs, gallops or murmurs. Lungs: clear Abdomen: soft, nontender, nondistended. No hepatosplenomegaly. No bruits or masses. Good bowel sounds. Extremities: no cyanosis, clubbing, rash, edema Neuro: alert & orientedx3, cranial nerves grossly intact. moves all 4 extremities w/o difficulty. Affect pleasant  No further VT overnight. Volume status looks good Remains on bival and warfarin. For voiding trials today. DFTs tomorrow.   [name], MD  10:43 PM

## 2021-03-13 NOTE — H&P (View-Only) (Signed)
Progress Note  Patient Name: [name] [name] Date of Encounter: 03/13/2021  [facility] Cardiologist: Nona Dell, MD   Subjective   Much more comfortable today after BM yesterday, no CP, no SOB  Inpatient Medications    Scheduled Meds: . amiodarone  400 mg Oral BID  . carvedilol  12.5 mg Oral BID WC  . Chlorhexidine Gluconate Cloth  6 each Topical Daily  . dapagliflozin propanediol  10 mg Oral Daily  . digoxin  0.125 mg Oral Daily  . DULoxetine  30 mg Oral Daily  . DULoxetine  60 mg Oral QPM  . ezetimibe  10 mg Oral Daily  . isosorbide mononitrate  30 mg Oral Daily  . melatonin  3 mg Oral QHS  . mexiletine  200 mg Oral Q8H  . ranolazine  500 mg Oral BID  . sacubitril-valsartan  1 tablet Oral BID  . senna-docusate  2 tablet Oral BID  . sodium chloride flush  3 mL Intravenous Q12H  . sodium chloride flush  3 mL Intravenous Q12H  . sodium chloride flush  3 mL Intravenous Q12H  . sodium chloride flush  3 mL Intravenous Q12H  . spironolactone  25 mg Oral Daily  . tamsulosin  0.4 mg Oral Daily  . torsemide  60 mg Oral Daily  . Warfarin - Pharmacist Dosing Inpatient   Does not apply q1600   Continuous Infusions: . sodium chloride    . sodium chloride    . bivalirudin (ANGIOMAX) infusion 0.5 mg/mL (Non-ACS indications) 0.1 mg/kg/hr (03/13/21 0637)   PRN Meds: sodium chloride, sodium chloride, acetaminophen, magnesium hydroxide, nitroGLYCERIN, ondansetron (ZOFRAN) IV, oxyCODONE-acetaminophen **AND** oxyCODONE, prochlorperazine, sodium chloride flush, traZODone   Vital Signs    Vitals:   03/13/21 0400 03/13/21 0500 03/13/21 0600 03/13/21 0700  BP: (!) 106/48 (!) 93/42 (!) 98/43 (!) 120/50  Pulse: 63 (!) 59 69 62  Resp: 20 17 (!) 21 15  Temp:    98 F (36.7 C)  TempSrc:    Oral  SpO2: 97% 97% 98% 99%  Weight:   90.2 kg   Height:        Intake/Output Summary (Last 24 hours) at 03/13/2021 0843 Last data filed at 03/13/2021 0600 Gross per 24 hour  Intake  835.46 ml  Output 4215 ml  Net -3379.54 ml   Last 3 Weights 03/13/2021 03/10/2021 03/05/2021  Weight (lbs) 198 lb 13.7 oz 192 lb 7.4 oz 193 lb 6.4 oz  Weight (kg) 90.2 kg 87.3 kg 87.726 kg      Telemetry    SR, rare PVC - Personally Reviewed  ECG    No new EKGs - Personally Reviewed  Physical Exam   GEN: No acute distress.   Neck: No JVD Cardiac: RRR, no murmurs, rubs, or gallops.  Respiratory: CTA b/l. GI: Soft, nontender, non-distended  MS: No edema; No deformity. Neuro:  Nonfocal  Psych: Normal affect   Labs    High Sensitivity Troponin:   Recent Labs  Lab 03/01/21 2125 03/02/21 0444  TROPONINIHS 2,161* 3,070*      Chemistry Recent Labs  Lab 03/11/21 0142 03/12/21 0108 03/13/21 0035  NA 136 134* 133*  K 4.9 4.5 3.7  CL 102 97* 96*  CO2 27 26 30   GLUCOSE 98 121* 98  BUN 14 11 14   CREATININE 0.87 0.89 1.00  CALCIUM 8.9 9.1 8.6*  GFRNONAA >60 >60 >60  ANIONGAP 7 11 7      Hematology Recent Labs  Lab 03/11/21 0142 03/12/21 0108  03/13/21 0035  WBC 9.8 12.5* 10.7*  RBC 4.41 4.49 4.29  HGB 13.6 13.4 12.9  HCT 39.9 40.0 38.2  MCV 90.5 89.1 89.0  MCH 30.8 29.8 30.1  MCHC 34.1 33.5 33.8  RDW 13.4 13.1 13.3  PLT 108* 111* 124*    BNPNo results for input(s): BNP, PROBNP in the last 168 hours.   DDimer No results for input(s): DDIMER in the last 168 hours.   Radiology    No results found.  Cardiac Studies    03/05/2021: LHC 1. Low filling pressures.  2. Preserved cardiac output.  3. Stable coronaries, patent LCx stent.  Known chronic occlusion of D1.    12/01/2020: TTE IMPRESSIONS  1. Left ventricular ejection fraction, by estimation, is 25 to 30%. The  left ventricle has severely decreased function. The left ventricle  demonstrates regional wall motion abnormalities with severe hypokinesis to  akinesis of the anterior and septal  walls. The left ventricular internal cavity size was mildly dilated.  2. Right ventricular systolic  function is mildly reduced. The right  ventricular size is normal.  3. Left atrial size was moderately dilated. No left atrial/left atrial  appendage thrombus was detected.  4. Right atrial size was mildly dilated.  5. There was moderate mitral regurgitation with ERO 0.26 cm^2 by PISA  (suspect functional MR). No evidence of mitral stenosis.  6. No PFO/ASD by color doppler.  7. The aortic valve is tricuspid. Aortic valve regurgitation is not  visualized. No aortic stenosis is present.  8. Peak RV-RA gradient 18 mmHg.  9. Normal caliber thoracic aorta with no significant plaque.    Patient Profile     60 y.o. female with a history of of CAD (PCI of LCx in Jan 2022), ICM (s/p[employer] dual chamber ICD implant 2017), cocaine abuse (in remission), CVA, AFIB, antiphospholipid syndrome (on Coumadin), who presented to the hospital for worsening SOB admitted with VT, failed ICD shocks  Assessment & Plan    1.  VT  VT ablation with scar modification 03/08/2021. She had several unstable VT circuits that were treated during the case. VT again the weekend Quiescent last couple days On  Mexiletine and amiodarone   Dr. Johney Frame noted: VT with CL of 410 msec, Morphology is L bundle branch R superior axis with diffusely negative precordial activation (suggesting apical origin) ICD interrogated and reprogrammed by Dr. Johney Frame   added a VT 1 zone from 130 bpm with ATP x 4 (burst 81%), ATP x 4 (ramp/scan 81%), no shocks. Previous VT zone at 170 bpm was moved to 180 bpm. All therapies stayed the same. VF zone remains the same at 200 bpm. Shock lead polarity was changed from reversed to initial.   INR today is 2.5 (on angiomax) Scheduled for DFT testing and possible placement of tunneled lead with Dr. Ladona Ridgel tomorrow Scheduled for 08:30 D/w Dr. Ladona Ridgel, stop angiomax at 07:00    2. Anxiety Home cymbalta    3. Urinary retention, constipation Large BM  yesterday Discontinue foley today  4. afib Remains in sinus   5. Antiphospholipid antibody syndrome INR  On Bival, hold tomorrow AM for procedure OK for therapeutic INR, preferably <3.0   6. Chronic systolic dysfunction/ ischemic CM/ CAD No ischemic symptoms No new findings on recent cath to explain VT C/w CHF team, not felt to be volume OL And will follow   For questions or updates, please contact [facility] Please consult [email or web address] for contact info under  Signed, Renee Lynn Ursuy, PA-C  03/13/2021, 8:43 AM    

## 2021-03-13 NOTE — Progress Notes (Signed)
Progress Note  Patient Name: [name] [name] Date of Encounter: 03/13/2021  [facility] Cardiologist: Nona Dell, MD   Subjective   Much more comfortable today after BM yesterday, no CP, no SOB  Inpatient Medications    Scheduled Meds: . amiodarone  400 mg Oral BID  . carvedilol  12.5 mg Oral BID WC  . Chlorhexidine Gluconate Cloth  6 each Topical Daily  . dapagliflozin propanediol  10 mg Oral Daily  . digoxin  0.125 mg Oral Daily  . DULoxetine  30 mg Oral Daily  . DULoxetine  60 mg Oral QPM  . ezetimibe  10 mg Oral Daily  . isosorbide mononitrate  30 mg Oral Daily  . melatonin  3 mg Oral QHS  . mexiletine  200 mg Oral Q8H  . ranolazine  500 mg Oral BID  . sacubitril-valsartan  1 tablet Oral BID  . senna-docusate  2 tablet Oral BID  . sodium chloride flush  3 mL Intravenous Q12H  . sodium chloride flush  3 mL Intravenous Q12H  . sodium chloride flush  3 mL Intravenous Q12H  . sodium chloride flush  3 mL Intravenous Q12H  . spironolactone  25 mg Oral Daily  . tamsulosin  0.4 mg Oral Daily  . torsemide  60 mg Oral Daily  . Warfarin - Pharmacist Dosing Inpatient   Does not apply q1600   Continuous Infusions: . sodium chloride    . sodium chloride    . bivalirudin (ANGIOMAX) infusion 0.5 mg/mL (Non-ACS indications) 0.1 mg/kg/hr (03/13/21 0637)   PRN Meds: sodium chloride, sodium chloride, acetaminophen, magnesium hydroxide, nitroGLYCERIN, ondansetron (ZOFRAN) IV, oxyCODONE-acetaminophen **AND** oxyCODONE, prochlorperazine, sodium chloride flush, traZODone   Vital Signs    Vitals:   03/13/21 0400 03/13/21 0500 03/13/21 0600 03/13/21 0700  BP: (!) 106/48 (!) 93/42 (!) 98/43 (!) 120/50  Pulse: 63 (!) 59 69 62  Resp: 20 17 (!) 21 15  Temp:    98 F (36.7 C)  TempSrc:    Oral  SpO2: 97% 97% 98% 99%  Weight:   90.2 kg   Height:        Intake/Output Summary (Last 24 hours) at 03/13/2021 0843 Last data filed at 03/13/2021 0600 Gross per 24 hour  Intake  835.46 ml  Output 4215 ml  Net -3379.54 ml   Last 3 Weights 03/13/2021 03/10/2021 03/05/2021  Weight (lbs) 198 lb 13.7 oz 192 lb 7.4 oz 193 lb 6.4 oz  Weight (kg) 90.2 kg 87.3 kg 87.726 kg      Telemetry    SR, rare PVC - Personally Reviewed  ECG    No new EKGs - Personally Reviewed  Physical Exam   GEN: No acute distress.   Neck: No JVD Cardiac: RRR, no murmurs, rubs, or gallops.  Respiratory: CTA b/l. GI: Soft, nontender, non-distended  MS: No edema; No deformity. Neuro:  Nonfocal  Psych: Normal affect   Labs    High Sensitivity Troponin:   Recent Labs  Lab 03/01/21 2125 03/02/21 0444  TROPONINIHS 2,161* 3,070*      Chemistry Recent Labs  Lab 03/11/21 0142 03/12/21 0108 03/13/21 0035  NA 136 134* 133*  K 4.9 4.5 3.7  CL 102 97* 96*  CO2 27 26 30   GLUCOSE 98 121* 98  BUN 14 11 14   CREATININE 0.87 0.89 1.00  CALCIUM 8.9 9.1 8.6*  GFRNONAA >60 >60 >60  ANIONGAP 7 11 7      Hematology Recent Labs  Lab 03/11/21 0142 03/12/21 0108  03/13/21 0035  WBC 9.8 12.5* 10.7*  RBC 4.41 4.49 4.29  HGB 13.6 13.4 12.9  HCT 39.9 40.0 38.2  MCV 90.5 89.1 89.0  MCH 30.8 29.8 30.1  MCHC 34.1 33.5 33.8  RDW 13.4 13.1 13.3  PLT 108* 111* 124*    BNPNo results for input(s): BNP, PROBNP in the last 168 hours.   DDimer No results for input(s): DDIMER in the last 168 hours.   Radiology    No results found.  Cardiac Studies    03/05/2021: LHC 1. Low filling pressures.  2. Preserved cardiac output.  3. Stable coronaries, patent LCx stent.  Known chronic occlusion of D1.    12/01/2020: TTE IMPRESSIONS  1. Left ventricular ejection fraction, by estimation, is 25 to 30%. The  left ventricle has severely decreased function. The left ventricle  demonstrates regional wall motion abnormalities with severe hypokinesis to  akinesis of the anterior and septal  walls. The left ventricular internal cavity size was mildly dilated.  2. Right ventricular systolic  function is mildly reduced. The right  ventricular size is normal.  3. Left atrial size was moderately dilated. No left atrial/left atrial  appendage thrombus was detected.  4. Right atrial size was mildly dilated.  5. There was moderate mitral regurgitation with ERO 0.26 cm^2 by PISA  (suspect functional MR). No evidence of mitral stenosis.  6. No PFO/ASD by color doppler.  7. The aortic valve is tricuspid. Aortic valve regurgitation is not  visualized. No aortic stenosis is present.  8. Peak RV-RA gradient 18 mmHg.  9. Normal caliber thoracic aorta with no significant plaque.    Patient Profile     60 y.o. female with a history of of CAD (PCI of LCx in Jan 2022), ICM (s/p[employer] dual chamber ICD implant 2017), cocaine abuse (in remission), CVA, AFIB, antiphospholipid syndrome (on Coumadin), who presented to the hospital for worsening SOB admitted with VT, failed ICD shocks  Assessment & Plan    1.  VT  VT ablation with scar modification 03/08/2021. She had several unstable VT circuits that were treated during the case. VT again the weekend Quiescent last couple days On  Mexiletine and amiodarone   Dr. Johney Frame noted: VT with CL of 410 msec, Morphology is L bundle branch R superior axis with diffusely negative precordial activation (suggesting apical origin) ICD interrogated and reprogrammed by Dr. Johney Frame   added a VT 1 zone from 130 bpm with ATP x 4 (burst 81%), ATP x 4 (ramp/scan 81%), no shocks. Previous VT zone at 170 bpm was moved to 180 bpm. All therapies stayed the same. VF zone remains the same at 200 bpm. Shock lead polarity was changed from reversed to initial.   INR today is 2.5 (on angiomax) Scheduled for DFT testing and possible placement of tunneled lead with Dr. Ladona Ridgel tomorrow Scheduled for 08:30 D/w Dr. Ladona Ridgel, stop angiomax at 07:00    2. Anxiety Home cymbalta    3. Urinary retention, constipation Large BM  yesterday Discontinue foley today  4. afib Remains in sinus   5. Antiphospholipid antibody syndrome INR  On Bival, hold tomorrow AM for procedure OK for therapeutic INR, preferably <3.0   6. Chronic systolic dysfunction/ ischemic CM/ CAD No ischemic symptoms No new findings on recent cath to explain VT C/w CHF team, not felt to be volume OL And will follow   For questions or updates, please contact [facility] Please consult [email or web address] for contact info under  Signed, Sheilah Pigeon, PA-C  03/13/2021, 8:43 AM

## 2021-03-13 NOTE — Plan of Care (Signed)
  Problem: Clinical Measurements: Goal: Respiratory complications will improve Outcome: Progressing   Problem: Clinical Measurements: Goal: Cardiovascular complication will be avoided Outcome: Progressing   

## 2021-03-13 NOTE — Progress Notes (Signed)
CARDIAC REHAB PHASE I   Holding ambulation at this time per RN until pts lead is fixed. Will f/u to ambulate when pt cleared.  Reynold Bowen, RN BSN 03/13/2021 10:58 AM

## 2021-03-13 NOTE — Progress Notes (Signed)
ANTICOAGULATION CONSULT NOTE   Pharmacy Consult for Warfarin and Bivalirudin Indication: persistent AFib, anticardiolipin syndrome  Allergies  Allergen Reactions  . Beef-Derived Products Anaphylaxis    From tick bite  . Metoclopramide Hcl Anaphylaxis    Non responsive  . Penicillins Anaphylaxis    Pt states she can take any cephalosporin (per pt. 01/01/19) Shock Has patient had a PCN reaction causing immediate rash, facial/tongue/throat swelling, SOB or lightheadedness with hypotension: Yes Has patient had a PCN reaction causing severe rash involving mucus membranes or skin necrosis: No Has patient had a PCN reaction that required hospitalization Yes Has patient had a PCN reaction occurring within the last 10 years: No If all of the above answers are "NO", then may proceed with Cephalosporin   . Pork-Derived Products Anaphylaxis    From tick bite. Has tolerated heparin/lovenox  . Metoprolol Other (See Comments)    Syncope   . Reglan [Metoclopramide]   . Statins Other (See Comments)    Myalgias    Patient Measurements: Height: 5\' 5"  (165.1 cm) Weight: 90.2 kg (198 lb 13.7 oz) IBW/kg (Calculated) : 57 Heparin dosing weight: 76 kg  Vital Signs: Temp: 98.2 F (36.8 C) (03/15 0350) Temp Source: Oral (03/15 0350) BP: 98/43 (03/15 0600) Pulse Rate: 69 (03/15 0600)  Labs: Recent Labs    03/11/21 0142 03/12/21 0108 03/12/21 1233 03/12/21 1828 03/13/21 0035  HGB 13.6 13.4  --   --  12.9  HCT 39.9 40.0  --   --  38.2  PLT 108* 111*  --   --  124*  APTT 88*  --  98* 95* 83*  LABPROT 27.4* 19.6*  --   --  26.0*  INR 2.7* 1.7*  --   --  2.5*  CREATININE 0.87 0.89  --   --  1.00    Estimated Creatinine Clearance: 68.9 mL/min (by C-G formula based on SCr of 1 mg/dL).   Medical History: Past Medical History:  Diagnosis Date  . Aneurysm of internal carotid artery   . Anticardiolipin antibody positive    Chronic Coumadin  . Brain aneurysm    followed by Dr. 03/15/21   . Chronic combined systolic (congestive) and diastolic (congestive) heart failure (HCC)   . Cocaine abuse in remission (HCC)   . Coronary atherosclerosis of native coronary artery    Previous invasive cardiac testing was done at a facility in [place], [place].  Occluded diagonal, Diffuse LAD disease, Occluded Cx, and non obstructive RCA.   . Essential hypertension   . Headache   . History of pneumonia   . ICD (implantable cardioverter-defibrillator) in place   . Ischemic cardiomyopathy    LVEF 30-35%  . Mixed hyperlipidemia   . NSVT (nonsustained ventricular tachycardia) (HCC)   . Persistent atrial fibrillation (HCC)   . PVD (peripheral vascular disease) (HCC)   . Raynaud's phenomenon   . Statin intolerance   . Stroke [facility]) 2007   Total occlusion of the right internal carotid    Assessment: [name] with history of persistent Afib and anticardiolipin syndrome on warfarin PTA. S/p R/LHC and successful VT ablation, restarted on warfarin and heparin post-ablation. Pharmacy asked to switch heparin to bivalirudin and to continue warfarin.  Plans for ICD system revision next week, possible Monday. Per EP, goal is for INR to be ~2-3  so that bivalirudin can be stopped safely prior to procedure.   INR up to 2.5 this AM on bivalirudin.  Planning for EP procedure Wed AM.  Per EP, will stop bivalirudin at 0700 on 3/16 and recheck INR 4 hrs later.  APTT this AM therapeutic at 83 sec and at 80 sec on recheck. No overt bleeding per discussion with patient.   PTA: 5 mg daily except 10 mg Sun/Thurs INR on admit: 2.1, last dose 3/3  Goal of Therapy:  INR 2-3 aPTT 50-85 seconds  Monitor platelets by anticoagulation protocol: Yes   Plan:  Warfarin 7.5 mg PO tonight Continue bivalirudin at 0.1 mg/kg/hr (dosing weight: 87.3 kg) Stop bivalirudin at 0700 on 3/16 and check INR at 1100 Daily aPTT, INR, CBC, s/sx bleeding  Tama Headings, PharmD, BCPS PGY2 Cardiology Pharmacy Resident Phone:  [phone] 03/13/2021  1:32 PM  Please check [email or web address] for unit-specific pharmacy phone numbers.

## 2021-03-13 NOTE — Progress Notes (Signed)
Remote ICD transmission.   

## 2021-03-13 NOTE — Progress Notes (Signed)
Paged Delton See MD in regards to ordered Coreg dose with patient's HR 57-58 as well as patient request of more medications for constipation. Orders received to administer Coreg as scheduled and for dulcolax p.o.

## 2021-03-14 ENCOUNTER — Encounter (HOSPITAL_COMMUNITY)
Admission: EM | Disposition: A | Discharge status: Home / Self Care | Payer: Self-pay | Source: Home / Self Care | Attending: Cardiology

## 2021-03-14 ENCOUNTER — Inpatient Hospital Stay (HOSPITAL_COMMUNITY): Payer: Medicare PPO | Admitting: Certified Registered Nurse Anesthetist

## 2021-03-14 ENCOUNTER — Encounter (HOSPITAL_COMMUNITY): Payer: Self-pay | Admitting: Internal Medicine

## 2021-03-14 DIAGNOSIS — I255 Ischemic cardiomyopathy: Secondary | ICD-10-CM

## 2021-03-14 DIAGNOSIS — I472 Ventricular tachycardia: Secondary | ICD-10-CM

## 2021-03-14 HISTORY — PX: ICD/BIV ICD FUNCTION(DFT) TEST: EP1204

## 2021-03-14 LAB — BASIC METABOLIC PANEL
Anion gap: 8 (ref 5–15)
BUN: 16 mg/dL (ref 6–20)
CO2: 29 mmol/L (ref 22–32)
Calcium: 8.5 mg/dL — ABNORMAL LOW (ref 8.9–10.3)
Chloride: 95 mmol/L — ABNORMAL LOW (ref 98–111)
Creatinine, Ser: 1.09 mg/dL — ABNORMAL HIGH (ref 0.44–1.00)
GFR, Estimated: 59 mL/min — ABNORMAL LOW (ref >60)
Glucose, Bld: 89 mg/dL (ref 70–99)
Potassium: 4.1 mmol/L (ref 3.5–5.1)
Sodium: 132 mmol/L — ABNORMAL LOW (ref 135–145)

## 2021-03-14 LAB — PROTIME-INR
INR: 2.9 — ABNORMAL HIGH (ref 0.8–1.2)
INR: 2.2 — ABNORMAL HIGH (ref 0.8–1.2)
Prothrombin Time: 29.7 seconds — ABNORMAL HIGH (ref 11.4–15.2)
Prothrombin Time: 23.6 seconds — ABNORMAL HIGH (ref 11.4–15.2)

## 2021-03-14 LAB — APTT: aPTT: 79 seconds — ABNORMAL HIGH (ref 24–36)

## 2021-03-14 LAB — GLUCOSE, CAPILLARY: Glucose-Capillary: 80 mg/dL (ref 70–99)

## 2021-03-14 SURGERY — ICD/BIV ICD FUNCTION (DFT) TEST
Anesthesia: Monitor Anesthesia Care

## 2021-03-14 MED ORDER — SENNOSIDES-DOCUSATE SODIUM 8.6-50 MG PO TABS
2.0000 | ORAL_TABLET | Freq: Two times a day (BID) | ORAL | Status: DC
  Administered 2021-03-14 – 2021-03-15 (×3): 2 via ORAL
  Filled 2021-03-14 (×3): qty 2

## 2021-03-14 MED ORDER — MIDAZOLAM HCL 2 MG/2ML IJ SOLN
INTRAMUSCULAR | Status: DC | PRN
  Administered 2021-03-14: 2 mg via INTRAVENOUS
  Administered 2021-03-14 (×2): 1 mg via INTRAVENOUS

## 2021-03-14 MED ORDER — ACETAMINOPHEN 325 MG PO TABS: 325.0000 mg | ORAL_TABLET | ORAL | Status: DC | PRN

## 2021-03-14 MED ORDER — ONDANSETRON HCL 4 MG/2ML IJ SOLN: 4.0000 mg | Freq: Four times a day (QID) | INTRAMUSCULAR | Status: DC | PRN

## 2021-03-14 MED ORDER — AMIODARONE HCL 200 MG PO TABS
400.0000 mg | ORAL_TABLET | Freq: Two times a day (BID) | ORAL | Status: DC
  Administered 2021-03-14 – 2021-03-15 (×3): 400 mg via ORAL
  Filled 2021-03-14 (×3): qty 2

## 2021-03-14 MED ORDER — WARFARIN SODIUM 5 MG PO TABS
5.0000 mg | ORAL_TABLET | Freq: Once | ORAL | Status: AC
  Administered 2021-03-14: 5 mg via ORAL
  Filled 2021-03-14: qty 1

## 2021-03-14 MED ORDER — TORSEMIDE 20 MG PO TABS
60.0000 mg | ORAL_TABLET | Freq: Every day | ORAL | Status: DC
  Administered 2021-03-14 – 2021-03-15 (×2): 60 mg via ORAL
  Filled 2021-03-14 (×2): qty 3

## 2021-03-14 MED ORDER — ETOMIDATE 2 MG/ML IV SOLN
INTRAVENOUS | Status: DC | PRN
  Administered 2021-03-14: 2 mg via INTRAVENOUS
  Administered 2021-03-14: 4 mg via INTRAVENOUS
  Administered 2021-03-14: 2 mg via INTRAVENOUS

## 2021-03-14 MED ORDER — EZETIMIBE 10 MG PO TABS
10.0000 mg | ORAL_TABLET | Freq: Every day | ORAL | Status: DC
  Administered 2021-03-14 – 2021-03-15 (×2): 10 mg via ORAL
  Filled 2021-03-14 (×2): qty 1

## 2021-03-14 MED ORDER — SODIUM CHLORIDE 0.9 % IV SOLN
250.0000 mL | INTRAVENOUS | Status: DC
  Administered 2021-03-14: 250 mL via INTRAVENOUS

## 2021-03-14 MED ORDER — LACTATED RINGERS IV SOLN: INTRAVENOUS | Status: DC | PRN

## 2021-03-14 MED ORDER — DEXMEDETOMIDINE HCL IN NACL 400 MCG/100ML IV SOLN
INTRAVENOUS | Status: DC | PRN
  Administered 2021-03-14: 1 ug/kg/h via INTRAVENOUS

## 2021-03-14 MED ORDER — CARVEDILOL 12.5 MG PO TABS
12.5000 mg | ORAL_TABLET | Freq: Two times a day (BID) | ORAL | Status: DC
  Administered 2021-03-14 – 2021-03-15 (×3): 12.5 mg via ORAL
  Filled 2021-03-14 (×3): qty 1

## 2021-03-14 MED ORDER — ISOSORBIDE MONONITRATE ER 30 MG PO TB24
30.0000 mg | ORAL_TABLET | Freq: Every day | ORAL | Status: DC
  Administered 2021-03-14 – 2021-03-15 (×2): 30 mg via ORAL
  Filled 2021-03-14 (×2): qty 1

## 2021-03-14 MED ORDER — TAMSULOSIN HCL 0.4 MG PO CAPS
0.4000 mg | ORAL_CAPSULE | Freq: Every day | ORAL | Status: DC
  Administered 2021-03-14 – 2021-03-15 (×2): 0.4 mg via ORAL
  Filled 2021-03-14 (×2): qty 1

## 2021-03-14 MED ORDER — SPIRONOLACTONE 25 MG PO TABS
25.0000 mg | ORAL_TABLET | Freq: Every day | ORAL | Status: DC
  Administered 2021-03-14 – 2021-03-15 (×2): 25 mg via ORAL
  Filled 2021-03-14 (×2): qty 1

## 2021-03-14 SURGICAL SUPPLY — 1 items: PAD PRO RADIOLUCENT 2001M-C (PAD) ×3 IMPLANT

## 2021-03-14 NOTE — Anesthesia Procedure Notes (Signed)
Procedure Name: MAC Date/Time: 03/14/2021 8:35 AM Performed by: Leonor Liv, CRNA Pre-anesthesia Checklist: Patient identified, Emergency Drugs available, Suction available, Patient being monitored and Timeout performed Oxygen Delivery Method: Simple face mask Placement Confirmation: positive ETCO2 Dental Injury: Teeth and Oropharynx as per pre-operative assessment

## 2021-03-14 NOTE — Interval H&P Note (Signed)
History and Physical Interval Note:  03/14/2021 7:16 AM  [name] [name]  has presented today for surgery, with the diagnosis of VT.  The various methods of treatment have been discussed with the patient and family. After consideration of risks, benefits and other options for treatment, the patient has consented to  Procedure(s): LEAD REVISION/REPAIR (N/A) ICD/BIV ICD FUNCTION (DFT) TEST (N/A) as a surgical intervention.  The patient's history has been reviewed, patient examined, no change in status, stable for surgery.  I have reviewed the patient's chart and labs.  Questions were answered to the patient's satisfaction.    EP Attending  Patient well known to me. She has an elevated DFT. She has been on amio and undergone VT ablation. She will undergo non-invasive DFT testing. If her threshold is too high, she will undergo insertion of an additional shocking coil in an attempt to lower the DFT. I have reviewed the indications/risks/benefits/goals/expectations and she wishes to proceed. Lewayne Bunting

## 2021-03-14 NOTE — Discharge Summary (Addendum)
DISCHARGE SUMMARY    Patient ID: [name] [name],  MRN: [id number], DOB/AGE: Dec 04, 1961 60 y.o.  Admit date: 03/01/2021 Discharge date: 03/15/2021  Primary Care Physician: Ignatius Specking, MD  Primary Cardiologist: Dr. Diona Browner AHF: Dr. Gala Romney Electrophysiologist: Dr. Ladona Ridgel  Primary Discharge Diagnosis:  1. VT  Secondary Discharge Diagnosis:  1. CAD 2. Chronic CHF (systolic) 3. ICM 4. Stroke     Historically 5. Paroxysmal Afib     CHA2DS2Vasc is 6 6. Anti-phospholipid antibody Syndrome     On warfarin 7. PVD     Known carotid occlusion (R) 8. Longstanding smoker    Allergies  Allergen Reactions   Beef-Derived Products Anaphylaxis    From tick bite   Metoclopramide Hcl Anaphylaxis    Non responsive   Penicillins Anaphylaxis    Pt states she can take any cephalosporin (per pt. 01/01/19) Shock Has patient had a PCN reaction causing immediate rash, facial/tongue/throat swelling, SOB or lightheadedness with hypotension: Yes Has patient had a PCN reaction causing severe rash involving mucus membranes or skin necrosis: No Has patient had a PCN reaction that required hospitalization Yes Has patient had a PCN reaction occurring within the last 10 years: No If all of the above answers are "NO", then may proceed with Cephalosporin    Pork-Derived Products Anaphylaxis    From tick bite. Has tolerated heparin/lovenox   Metoprolol Other (See Comments)    Syncope    Reglan [Metoclopramide]    Statins Other (See Comments)    Myalgias     Procedures This Admission:      Brief HPI: [name] [name] is a 60 y.o. female w/PMHx above admitted with progressive fatigue, weakness, and CP, developed VT. Recurrent ICD shocks.   Hospital Course:  The patient was admitted to ICU, found with abnormal HS Trop with known hx of CAD, and some c/o CP.  No significant electrolyte abnormalities noted.  Once INR permitted planned for LHC.  Not felt to be volume OL, amiodarone was  reloaded with increased PO dosing.  EP was consulted and interrogation of her device noted, multiple episodes of ventricular tachycardia.  The EGM was fairly similar between the majority of the VT episodes although there is more than one VT morphology.  ATP schemes were programmed brief and were not effective.  ICD shocks delivered x4 did not convert the patient's ventricular tachycardia.  The shock vector includes the SVC coil at the current time and the polarity is reversed. She had LHC with noted stable CAD, RHC with normal/low filling pressures and her torsemide was reduced.  EP elected to pursue EPS/ablation strategy given no new CAD and already on 2 AADs.  Also discussed need for DFT testing and perhaps ICD system revision. She underwent VT ablation 03/08/21 She was maintained on heparin gtt in between her procedures changed to bivalirudin with low platelets and concern for HIT 03/10/21 she developed recurrent VT hat broke with vagal maneuver and started on lidocaine gtt with her PO amiodarone, her coreg increased.  03/11/21 transitioned to mexiletine at a higher dose. Warfarin resumed. She maintained SR subsequently. Underwent DFT testing 03/14/21 with successful shock at 21J her device programmed to 41J and not felt to require system revision (additional tunneled lead). She was followed by AHF team through her stay, volume status remained stable.  She maintained SR without recurrent VT, she has  Been ambulating independently without difficulty  The patient feels well, denies any CP or SOB, she was examined by  Dr Ladona Ridgel  and considered stable for discharge to home.   discussed No driving 6 months, has has quit driving all together already    Physical Exam: Vitals:   03/15/21 0621 03/15/21 0730 03/15/21 0934 03/15/21 0935  BP: (!) 100/59 (!) 123/55 (!) 104/48 (!) 113/56  Pulse: 65 (!) 59    Resp: 18 14    Temp: 98.3 F (36.8 C) 98.4 F (36.9 C)    TempSrc: Oral Oral    SpO2: 96% 98%     Weight: 89.9 kg     Height:         GEN- The patient is well appearing, alert and oriented x 3 today.   HEENT: normocephalic, atraumatic; sclera clear, conjunctiva pink; hearing intact; oropharynx clear Lungs- CTA b/l, normal work of breathing.  No wheezes, rales, rhonchi Heart- RRR, no murmurs, rubs or gallops, PMI not laterally displaced GI- soft, non-tender, non-distended Extremities- no clubbing, cyanosis, or edema MS- no significant deformity or atrophy Skin- warm and dry, no rash or lesion Psych- euthymic mood, full affect Neuro- no gross defecits  Labs:   Lab Results  Component Value Date   WBC 8.2 03/15/2021   HGB 11.9 (L) 03/15/2021   HCT 36.7 03/15/2021   MCV 91.5 03/15/2021   PLT 160 03/15/2021    Recent Labs  Lab 03/15/21 0328  NA 134*  K 3.8  CL 99  CO2 25  BUN 16  CREATININE 0.99  CALCIUM 8.5*  GLUCOSE 77    Discharge Medications:  Allergies as of 03/15/2021       Reactions   Beef-derived Products Anaphylaxis   From tick bite   Metoclopramide Hcl Anaphylaxis   Non responsive   Penicillins Anaphylaxis   Pt states she can take any cephalosporin (per pt. 01/01/19) Shock Has patient had a PCN reaction causing immediate rash, facial/tongue/throat swelling, SOB or lightheadedness with hypotension: Yes Has patient had a PCN reaction causing severe rash involving mucus membranes or skin necrosis: No Has patient had a PCN reaction that required hospitalization Yes Has patient had a PCN reaction occurring within the last 10 years: No If all of the above answers are "NO", then may proceed with Cephalosporin    Pork-derived Products Anaphylaxis   From tick bite. Has tolerated heparin/lovenox   Metoprolol Other (See Comments)   Syncope   Reglan [metoclopramide]    Statins Other (See Comments)   Myalgias        Medication List     STOP taking these medications    Farxiga 10 MG Tabs tablet Generic drug: dapagliflozin propanediol       TAKE  these medications    amiodarone 400 MG tablet Commonly known as: PACERONE Take 1 tablet (400 mg total) by mouth daily. What changed:  medication strength how much to take   carvedilol 12.5 MG tablet Commonly known as: COREG Take 6.25 mg by mouth 2 (two) times daily. What changed: Another medication with the same name was removed. Continue taking this medication, and follow the directions you see here.   clopidogrel 75 MG tablet Commonly known as: PLAVIX Take 1 tablet (75 mg total) by mouth daily with breakfast.   digoxin 0.125 MG tablet Commonly known as: LANOXIN Take by mouth.   doxycycline 100 MG tablet Commonly known as: VIBRA-TABS Take 100 mg by mouth See admin instructions. Bid x 7 days   DULoxetine 30 MG capsule Commonly known as: CYMBALTA Take 30 mg by mouth daily.   DULoxetine 60 MG capsule  Commonly known as: CYMBALTA Take 60 mg by mouth every evening.   empagliflozin 10 MG Tabs tablet Commonly known as: Jardiance Take 1 tablet (10 mg total) by mouth daily before breakfast.   ezetimibe 10 MG tablet Commonly known as: ZETIA Take 1 tablet (10 mg total) by mouth daily.   famotidine 20 MG tablet Commonly known as: PEPCID Take 1 tablet (20 mg total) by mouth 2 (two) times daily. What changed: when to take this   fluticasone 50 MCG/ACT nasal spray Commonly known as: FLONASE Place 1 spray into both nostrils daily as needed for allergies.   gabapentin 300 MG capsule Commonly known as: NEURONTIN Take 300 mg by mouth See admin instructions. Bid x 14 days, then qd x 14 days then stop   isosorbide mononitrate 30 MG 24 hr tablet Commonly known as: IMDUR Take 0.5 tablets (15 mg total) by mouth daily. Start taking on: March 16, 2021 What changed: how much to take   methocarbamol 750 MG tablet Commonly known as: ROBAXIN Take 750 mg by mouth every 8 (eight) hours as needed for muscle spasms.   mexiletine 200 MG capsule Commonly known as: MEXITIL Take 1  capsule (200 mg total) by mouth every 8 (eight) hours. What changed:  medication strength how much to take   multivitamins ther. w/minerals Tabs tablet Take 1 tablet by mouth daily.   nitroGLYCERIN 0.4 MG SL tablet Commonly known as: NITROSTAT PLACE 1 TABLET UNDER THE TONGUE EVERY 5 MINUTES FOR 3 DOSES AS NEEDED CHEST PAIN What changed: See the new instructions.   oxyCODONE-acetaminophen 10-325 MG tablet Commonly known as: PERCOCET Take 1 tablet by mouth every 4 (four) hours as needed for pain.   potassium chloride SA 20 MEQ tablet Commonly known as: KLOR-CON Take 2 tablets (40 mEq total) by mouth daily.   promethazine 25 MG tablet Commonly known as: PHENERGAN Take 25 mg by mouth every 6 (six) hours as needed for nausea. With pain medication   sacubitril-valsartan 24-26 MG Commonly known as: ENTRESTO Take 1 tablet by mouth 2 (two) times daily.   spironolactone 25 MG tablet Commonly known as: ALDACTONE Take 1 tablet (25 mg total) by mouth daily.   Torsemide 60 MG Tabs Take 60 mg by mouth daily. Start taking on: March 16, 2021 What changed:  medication strength See the new instructions.   warfarin 10 MG tablet Commonly known as: COUMADIN Take as directed. If you are unsure how to take this medication, talk to your nurse or doctor. Original instructions: TAKE 1/2 TABLET BY MOUTH EVERY DAY EXCEPT 1 TABLET ON SUNDAYS AND THURSDAYS What changed:  how much to take how to take this when to take this additional instructions Notes to patient: Continue your current home regime as you were  Taking prior to coming into the hospital Coumadin clinic visit 03/19/21   zolpidem 10 MG tablet Commonly known as: AMBIEN Take 10 mg by mouth at bedtime as needed for sleep.        Disposition: Home Discharge Instructions     Diet - low sodium heart healthy   Complete by: As directed    Increase activity slowly   Complete by: As directed        Follow-up Information      [facility] Follow up on 03/28/2021.   Specialty: Cardiology Why: at 08:40 Contact information: [address] [id number] [place] [address] [phone]        [facility] Follow up.   Specialty: Cardiology Why: 03/19/21 @ 3:15PM, coumadin clinic Contact information: [address] [place] [address] [phone]        [name], MD Follow up.   Specialty: Cardiology Why: 04/17/21 @ 2:45PM Contact information: [address] [place] [address] [phone]                 Duration of Discharge Encounter: Greater than 30 minutes including physician time.   ADDED: only to correct discharge date  Signed, Maryella Shivers 03/15/2021 11:13 AM  EP Attending  Patient seen and examined. Agree with the findings as noted above. The patient has been stable overnight with no additional VT. Her DFT testing yesterday demonstrated a DFT of 21 joules. Her device was programmed to 41 joules. She will be discharged home. Her bp a little soft and we will reduce the dose of imdur from 30 to 15 mg daily. She has had her coreg uptitrated and we will continue amio 400 mg daily. I plan to reduce the dose when I see her back in several weeks.   Sharlot Gowda Terin Cragle,MD

## 2021-03-14 NOTE — Progress Notes (Signed)
   03/14/21 1600  Clinical Encounter Type  Visited With Patient  Visit Type Initial  Referral From Nurse  Consult/Referral To Chaplain  The chaplain spoke to the patient about the Advanced Directive request. The patient has a HCPOA. She also stated that her HCPOA is listed on her medical file. The chaplain provided social and emotional support to the patient. The chaplain will follow up as needed.

## 2021-03-14 NOTE — Transfer of Care (Signed)
Immediate Anesthesia Transfer of Care Note  Patient: [name] [name]  Procedure(s) Performed: ICD/BIV ICD FUNCTION (DFT) TEST (N/A )  Patient Location: Cath Lab  Anesthesia Type:MAC  Level of Consciousness: drowsy and patient cooperative  Airway & Oxygen Therapy: Patient Spontanous Breathing and Patient connected to nasal cannula oxygen  Post-op Assessment: Report given to RN, Post -op Vital signs reviewed and stable and Patient moving all extremities  Post vital signs: Reviewed and stable  Last Vitals:  Vitals Value Taken Time  BP 97/43 03/14/21 0929  Temp 36.4 C 03/14/21 0922  Pulse 53 03/14/21 0930  Resp 12 03/14/21 0930  SpO2 100 % 03/14/21 0930  Vitals shown include unvalidated device data.  Last Pain:  Vitals:   03/14/21 0922  TempSrc: Temporal  PainSc: Asleep      Patients Stated Pain Goal: 2 (80/88/11 0315)  Complications: No complications documented.

## 2021-03-14 NOTE — Progress Notes (Addendum)
Patient ID: [name] [name], female   DOB: 14-Jun-1961, 60 y.o.   MRN: [id number]     Advanced Heart Failure Rounding Note  PCP-Cardiologist: Nona Dell, MD   Subjective:    - Admitted  for ICD shocks x 4 for VT. - 3/7 had RHC/LHC with stable coronaries, low filling pressures, and preserved cardiac output. Post cath torsemide cut back. - 3/10 S/P VT ablation  - 3/12 VT x 20 mins. Switched to lidocaine.   - 3/13 Lido switched back to mexilitene  Had BM 3/14   Anxious about procedure.    Objective:   Weight Range: 90.2 kg Body mass index is 33.09 kg/m.   Vital Signs:   Temp:  [97.5 F (36.4 C)-98.3 F (36.8 C)] 97.8 F (36.6 C) (03/16 0300) Pulse Rate:  [36-98] 62 (03/16 0600) Resp:  [13-24] 24 (03/16 0600) BP: (84-127)/(38-69) 116/45 (03/16 0600) SpO2:  [93 %-100 %] 97 % (03/16 0600) Last BM Date: 03/12/21  Weight change: Filed Weights   03/05/21 0418 03/10/21 0535 03/13/21 0600  Weight: 87.7 kg 87.3 kg 90.2 kg    Intake/Output:   Intake/Output Summary (Last 24 hours) at 03/14/2021 0744 Last data filed at 03/14/2021 0600 Gross per 24 hour  Intake 410.26 ml  Output 2200 ml  Net -1789.74 ml      Physical Exam  General:   No resp difficulty HEENT: normal Neck: supple. no JVD. Carotids 2+ bilat; no bruits. No lymphadenopathy or thryomegaly appreciated. Cor: PMI nondisplaced. Regular rate & rhythm. No rubs, gallops or murmurs. Lungs: clear Abdomen: soft, nontender, nondistended. No hepatosplenomegaly. No bruits or masses. Good bowel sounds. Extremities: no cyanosis, clubbing, rash, edema Neuro: alert & orientedx3, cranial nerves grossly intact. moves all 4 extremities w/o difficulty. Affect pleasant    Telemetry   NSR 50-60s personally reviewed.    Labs    CBC Recent Labs    03/12/21 0108 03/13/21 0035  WBC 12.5* 10.7*  HGB 13.4 12.9  HCT 40.0 38.2  MCV 89.1 89.0  PLT 111* 124*   Basic Metabolic Panel Recent Labs    65/68/12 0035  03/14/21 0043  NA 133* 132*  K 3.7 4.1  CL 96* 95*  CO2 30 29  GLUCOSE 98 89  BUN 14 16  CREATININE 1.00 1.09*  CALCIUM 8.6* 8.5*   Liver Function Tests No results for input(s): AST, ALT, ALKPHOS, BILITOT, PROT, ALBUMIN in the last 72 hours. No results for input(s): LIPASE, AMYLASE in the last 72 hours. Cardiac Enzymes No results for input(s): CKTOTAL, CKMB, CKMBINDEX, TROPONINI in the last 72 hours.  BNP: BNP (last 3 results) Recent Labs    12/14/20 1921 02/27/21 1605 03/01/21 2125  BNP 209.0* 320.5* 194.0*    ProBNP (last 3 results) No results for input(s): PROBNP in the last 8760 hours.   D-Dimer No results for input(s): DDIMER in the last 72 hours. Hemoglobin A1C No results for input(s): HGBA1C in the last 72 hours. Fasting Lipid Panel No results for input(s): CHOL, HDL, LDLCALC, TRIG, CHOLHDL, LDLDIRECT in the last 72 hours. Thyroid Function Tests No results for input(s): TSH, T4TOTAL, T3FREE, THYROIDAB in the last 72 hours.  Invalid input(s): FREET3  Other results:   Imaging    No results found.   Medications:     Scheduled Medications: . amiodarone  400 mg Oral BID  . carvedilol  12.5 mg Oral BID WC  . Chlorhexidine Gluconate Cloth  6 each Topical Daily  . dapagliflozin propanediol  10 mg Oral Daily  .  digoxin  0.125 mg Oral Daily  . DULoxetine  30 mg Oral Daily  . DULoxetine  60 mg Oral QPM  . ezetimibe  10 mg Oral Daily  . gentamicin irrigation  80 mg Irrigation On Call  . isosorbide mononitrate  30 mg Oral Daily  . melatonin  3 mg Oral QHS  . mexiletine  200 mg Oral Q8H  . ranolazine  500 mg Oral BID  . sacubitril-valsartan  1 tablet Oral BID  . senna-docusate  2 tablet Oral BID  . sodium chloride flush  3 mL Intravenous Q12H  . sodium chloride flush  3 mL Intravenous Q12H  . sodium chloride flush  3 mL Intravenous Q12H  . sodium chloride flush  3 mL Intravenous Q12H  . spironolactone  25 mg Oral Daily  . tamsulosin  0.4 mg Oral  Daily  . torsemide  60 mg Oral Daily  . Warfarin - Pharmacist Dosing Inpatient   Does not apply q1600    Infusions: . sodium chloride    . sodium chloride    . sodium chloride 50 mL/hr at 03/14/21 3149  . sodium chloride 250 mL (03/14/21 0630)  . vancomycin      PRN Medications: sodium chloride, sodium chloride, acetaminophen, bisacodyl, magnesium hydroxide, nitroGLYCERIN, ondansetron (ZOFRAN) IV, oxyCODONE-acetaminophen **AND** oxyCODONE, prochlorperazine, sodium chloride flush, sodium chloride flush, traZODone   Assessment/Plan   1. VT: Suspect scar-mediated VT, no new coronary disease on angiography this admission.  Prior VT in 1/22.  Had PCI to LCx in 1/22. HS-TnI up to 3070 but appears to be demand ischemia with VT and shocks x 4 rather than true ACS as no new disease on coronary angiography. Shocks by ICD were ineffective.  - S/P VT ablation 3/10.  -Recurrent VT 3/12 in am. Lido started/  - Continue  amiodarone 400 mg bid +  Ranolazine + coreg + mexilitene  - D/w EP . - Keep K > 4.0 Mg > 2.0 - 3/16-->Plan for DFTs  +/- ICD system revision given ineffective shocks.  2. CAD: [facility], cath at the time showed totally occluded diagonal, totally occluded LCX, and diffuse LAD disease.  No intervention. LHC was done in 12/21 due to VT, this showed chronically occluded D1 and 95% proximal LCx.  Initially, no PCI due to thrombocytopenia.  She eventually had PCI to proximal LCx in 1/22.  Elevated troponin appears to be demand ischemic as no new CAD noted on coronary angiography this admission.   - No chest pain.  - Continue Zetia, has not tolerated statins.  Good lipids in 12/21.   - Hold plavix. - On warfarin  3. Atrial fibrillation: TEE-guided DCCV to NSR in 12/21.  She is in NSR. No breakthrough Afib on device interrogation.  - continue amiodarone  - No A fib.  - If atrial fibrillation recurs, will need to consider Tikosyn versus ablation.  - INR 2.9 (was on bival) 4.  Carotid stenosis: Known RICA occlusion. 03/05/21 dopplers with TO RICA, 1-39% LICA.   5. 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  - On  warfarin as noted above. .  6. Chronic systolic CHF: Ischemic cardiomyopathy.  [employer] ICD.  Echo (8/21) with EF 15-20%, anteroseptal AK, RV normal, severe LAE, mild MR.  TEE (12/21) with EF 25-30%.  RHC in 12/21 with preserved cardiac output and optimized filling pressures.  NYHA class II symptoms, not volume overloaded on exam.  RHC showed preserved cardiac output and low filling pressures.  -  Volume status stable.  Continue torsemide 60 mg daily.  - Continue Entresto 24-26 bid  - Continue Farxiga 10 mg daily.   - Continue spironolactone 25 mg daily.  - Continue Coreg 12.5 mg twice a day.   - Continue digoxin, level ok.  - Renal function stable.  7. Smoking: Active smoker, wants to stop on her own.  8. PAD: She denies claudication.  - Needs to quit smoking.  9. 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  Plts have dropped in hospital in past, not thought to be HIT.   - No CBC today. Recent Platelets 124.  - She will see Dr. Truett Perna (hematology).  10. Mitral regurgitation: Moderate on 12/21 TEE, suspect functional.  11. 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 EP following 12. Urinary retention - attributed to Xanax. It has been stopped - Started flomax on 3/13 - Foley out. No issues.  13. Constipation - Resolved.   Plan for DFT today per EP.    Length of Stay: 12  Tonye Becket, NP  03/14/2021, 7:44 AM  Advanced Heart Failure Team Pager [phone] (M-F; 7a - 5p)  Please contact [facility] for night-coverage after hours (4p -7a ) and weekends on [email or web address]   Patient seen and examined with the above-signed Advanced Practice Provider and/or Housestaff. I personally reviewed laboratory data, imaging studies and relevant notes. I independently examined the patient and  formulated the important aspects of the plan. I have edited the note to reflect any of my changes or salient points. I have personally discussed the plan with the patient and/or family.  No further VT on oral amio and mexilitene. Underwent successful DFTs today with deivce reprogramming by Dr. Ladona Ridgel. HF stable  General:  Well appearing. No resp difficulty HEENT: normal Neck: supple. no JVD. Carotids 2+ bilat; no bruits. No lymphadenopathy or thryomegaly appreciated. Cor: PMI nondisplaced. Regular rate & rhythm. No rubs, gallops or murmurs. Lungs: clear Abdomen: soft, nontender, nondistended. No hepatosplenomegaly. No bruits or masses. Good bowel sounds. Extremities: no cyanosis, clubbing, rash, edema Neuro: alert & orientedx3, cranial nerves grossly intact. moves all 4 extremities w/o difficulty. Affect pleasant  VT quiescent. Successful DFTs today with ICD reprogramming. HF stable on good med regimen. HF team will follow at a distance. Please call with questions   [name], MD  10:26 PM

## 2021-03-14 NOTE — Progress Notes (Signed)
ANTICOAGULATION CONSULT NOTE   Pharmacy Consult for Warfarin  Indication: persistent AFib, anticardiolipin syndrome  Allergies  Allergen Reactions  . Beef-Derived Products Anaphylaxis    From tick bite  . Metoclopramide Hcl Anaphylaxis    Non responsive  . Penicillins Anaphylaxis    Pt states she can take any cephalosporin (per pt. 01/01/19) Shock Has patient had a PCN reaction causing immediate rash, facial/tongue/throat swelling, SOB or lightheadedness with hypotension: Yes Has patient had a PCN reaction causing severe rash involving mucus membranes or skin necrosis: No Has patient had a PCN reaction that required hospitalization Yes Has patient had a PCN reaction occurring within the last 10 years: No If all of the above answers are "NO", then may proceed with Cephalosporin   . Pork-Derived Products Anaphylaxis    From tick bite. Has tolerated heparin/lovenox  . Metoprolol Other (See Comments)    Syncope   . Reglan [Metoclopramide]   . Statins Other (See Comments)    Myalgias    Patient Measurements: Height: 5\' 5"  (165.1 cm) Weight: 90.2 kg (198 lb 13.7 oz) IBW/kg (Calculated) : 57 Heparin dosing weight: 76 kg  Vital Signs: Temp: 97.8 F (36.6 C) (03/16 0300) Temp Source: Oral (03/16 0300) BP: 116/45 (03/16 0600) Pulse Rate: 62 (03/16 0600)  Labs: Recent Labs    03/12/21 0108 03/12/21 1233 03/13/21 0035 03/13/21 0820 03/14/21 0043  HGB 13.4  --  12.9  --   --   HCT 40.0  --  38.2  --   --   PLT 111*  --  124*  --   --   APTT  --    < > 83* 80* 79*  LABPROT 19.6*  --  26.0*  --  29.7*  INR 1.7*  --  2.5*  --  2.9*  CREATININE 0.89  --  1.00  --  1.09*   < > = values in this interval not displayed.    Estimated Creatinine Clearance: 63.2 mL/min (A) (by C-G formula based on SCr of 1.09 mg/dL (H)).   Medical History: Past Medical History:  Diagnosis Date  . Aneurysm of internal carotid artery   . Anticardiolipin antibody positive    Chronic Coumadin   . Brain aneurysm    followed by Dr. 03/16/21  . Chronic combined systolic (congestive) and diastolic (congestive) heart failure (HCC)   . Cocaine abuse in remission (HCC)   . Coronary atherosclerosis of native coronary artery    Previous invasive cardiac testing was done at a facility in [name], [place].  Occluded diagonal, Diffuse LAD disease, Occluded Cx, and non obstructive RCA.   . Essential hypertension   . Headache   . History of pneumonia   . ICD (implantable cardioverter-defibrillator) in place   . Ischemic cardiomyopathy    LVEF 30-35%  . Mixed hyperlipidemia   . NSVT (nonsustained ventricular tachycardia) (HCC)   . Persistent atrial fibrillation (HCC)   . PVD (peripheral vascular disease) (HCC)   . Raynaud's phenomenon   . Statin intolerance   . Stroke [facility]) 2007   Total occlusion of the right internal carotid    Assessment: [name] with history of persistent Afib and anticardiolipin syndrome on warfarin PTA. S/p R/LHC and successful VT ablation, restarted on warfarin and heparin post-ablation. Pharmacy asked to switch heparin to bivalirudin and to continue warfarin.  APTT therapeutic early this morning. INR 2.9 this morning on bivalirudin. Bivalirudin infusion now turned off and pt now s/p DFT testing without ICD revision. INR  2.2 after Bivalirudin infusion stopped for 4 hrs. CBC stable. Will not restart Bivalirudin since INR therapeutic.  PTA: 5 mg daily except 10 mg Sun/Thurs INR on admit: 2.1, last dose 3/3  Goal of Therapy:  INR 2-3  Monitor platelets by anticoagulation protocol: Yes   Plan:  Warfarin 5 mg PO tonight Daily INR, CBC, s/sx bleeding  Tama Headings, PharmD, BCPS PGY2 Cardiology Pharmacy Resident Phone: [phone] 03/14/2021  7:44 AM  Please check [email or web address] for unit-specific pharmacy phone numbers.

## 2021-03-14 NOTE — Progress Notes (Signed)
CARDIAC REHAB PHASE I   PRE:  Rate/Rhythm: 58-60 SB-SR  BP:  Supine: 98/53  Sitting: 75/49  Standing: 75/46    SaO2: 97%RA  MODE:  Ambulation: 0 ft     1419-1440 Came to see pt to walk, too orthostatic as documented above. Pt's legs trembling when standing. Back to bed and encouraged pt not to get up at this time. Notified staff of low BPs. Will follow up tomorrow.  Pt hoping to get to recliner later if BP better.   Luetta Nutting, RN BSN  03/14/2021 2:35 PM

## 2021-03-14 NOTE — Anesthesia Preprocedure Evaluation (Addendum)
Anesthesia Evaluation  Patient identified by MRN, date of birth, ID band Patient awake    Reviewed: Allergy & Precautions, H&P , NPO status , Patient's Chart, lab work & pertinent test results  Airway Mallampati: II   Neck ROM: full    Dental   Pulmonary shortness of breath, Current Smoker,    breath sounds clear to auscultation       Cardiovascular hypertension, + CAD, + Cardiac Stents, + Peripheral Vascular Disease and +CHF  + dysrhythmias Atrial Fibrillation + Cardiac Defibrillator  Rhythm:regular Rate:Normal  TEE (11/2020): EF 25-30%. HK anterior and septum.   Neuro/Psych  Headaches, CVA    GI/Hepatic GERD  ,(+)     substance abuse  cocaine use,   Endo/Other    Renal/GU      Musculoskeletal   Abdominal   Peds  Hematology Anticardiolipin antibody. Takes coumadin.   Anesthesia Other Findings   Reproductive/Obstetrics                             Anesthesia Physical Anesthesia Plan  ASA: III  Anesthesia Plan: General   Post-op Pain Management:    Induction: Intravenous  PONV Risk Score and Plan: 2 and Ondansetron, Dexamethasone, Midazolam and Treatment may vary due to age or medical condition  Airway Management Planned: Oral ETT  Additional Equipment: Arterial line  Intra-op Plan:   Post-operative Plan: Extubation in OR  Informed Consent: I have reviewed the patients History and Physical, chart, labs and discussed the procedure including the risks, benefits and alternatives for the proposed anesthesia with the patient or authorized representative who has indicated his/her understanding and acceptance.     Dental advisory given  Plan Discussed with: CRNA, Anesthesiologist and Surgeon  Anesthesia Plan Comments:        Anesthesia Quick Evaluation

## 2021-03-15 ENCOUNTER — Encounter (HOSPITAL_COMMUNITY): Payer: Self-pay | Admitting: Internal Medicine

## 2021-03-15 LAB — BASIC METABOLIC PANEL
Anion gap: 10 (ref 5–15)
BUN: 16 mg/dL (ref 6–20)
CO2: 25 mmol/L (ref 22–32)
Calcium: 8.5 mg/dL — ABNORMAL LOW (ref 8.9–10.3)
Chloride: 99 mmol/L (ref 98–111)
Creatinine, Ser: 0.99 mg/dL (ref 0.44–1.00)
GFR, Estimated: >60 mL/min (ref >60)
Glucose, Bld: 77 mg/dL (ref 70–99)
Potassium: 3.8 mmol/L (ref 3.5–5.1)
Sodium: 134 mmol/L — ABNORMAL LOW (ref 135–145)

## 2021-03-15 LAB — CBC
HCT: 36.7 % (ref 36.0–46.0)
Hemoglobin: 11.9 g/dL — ABNORMAL LOW (ref 12.0–15.0)
MCH: 29.7 pg (ref 26.0–34.0)
MCHC: 32.4 g/dL (ref 30.0–36.0)
MCV: 91.5 fL (ref 80.0–100.0)
Platelets: 160 10*3/uL (ref 150–400)
RBC: 4.01 MIL/uL (ref 3.87–5.11)
RDW: 13.1 % (ref 11.5–15.5)
WBC: 8.2 10*3/uL (ref 4.0–10.5)
nRBC: 0 % (ref 0.0–0.2)

## 2021-03-15 LAB — PROTIME-INR
INR: 2.3 — ABNORMAL HIGH (ref 0.8–1.2)
Prothrombin Time: 24.4 seconds — ABNORMAL HIGH (ref 11.4–15.2)

## 2021-03-15 MED ORDER — MEXILETINE HCL 200 MG PO CAPS: 200.0000 mg | ORAL_CAPSULE | Freq: Three times a day (TID) | ORAL | 7 refills | Status: DC

## 2021-03-15 MED ORDER — ISOSORBIDE MONONITRATE ER 30 MG PO TB24: 15.0000 mg | ORAL_TABLET | Freq: Every day | ORAL | 6 refills | Status: DC

## 2021-03-15 MED ORDER — MEXILETINE HCL 200 MG PO CAPS: 200.0000 mg | ORAL_CAPSULE | Freq: Three times a day (TID) | ORAL | 6 refills | Status: DC

## 2021-03-15 MED ORDER — TORSEMIDE 60 MG PO TABS: 60.0000 mg | ORAL_TABLET | Freq: Every day | ORAL | 6 refills | Status: DC

## 2021-03-15 MED ORDER — AMIODARONE HCL 400 MG PO TABS: 400.0000 mg | ORAL_TABLET | Freq: Every day | ORAL | 6 refills | Status: DC

## 2021-03-15 NOTE — Consult Note (Signed)
   [facility] CM Inpatient Consult   03/15/2021  [name] [name] 01-05-1961 [id number]   [facility] [[facility]]  Accountable Care Organization [ACO] Patient: [facility] Medicare   Patient noted on high risk score for unplanned readmission [extreme high] risk and for length of stay list.  Follow up in unit progression meeting discussion to assess for potential [facility]  [[facility]] Care Management  for post hospital transition needs.  Spoke with patient via hospital bedside phone, HIPAA confirmed. She endorses Dr. Herminio Commons B.Vyas with [facility] as her primary care provider and expects to have a follow up appointment within the next 7 days or so. This provider is listed for the Transition of Care [TOC] follow up.   Explained [facility] Care Management for post hospital support however, patient states she feels no need for additional follow up support and will call her insurance customer service or Dr. Sherril Croon if she has additional needs.   Plan:  Patient declines any needs at this time. Will sign off at transition from the hospital.    For questions contact:   Charlesetta Shanks, RN BSN CCM [facility] Liaison Toll free office [phone]  Fax number: [phone] [email or web address]

## 2021-03-15 NOTE — Plan of Care (Signed)

## 2021-03-15 NOTE — Care Management Important Message (Signed)
Important Message  Patient Details  Name: RAI SINAGRA MRN: [id number] Date of Birth: [birth year]   Medicare Important Message Given:  Yes     Renie Ora 03/15/2021, 9:14 AM

## 2021-03-15 NOTE — Progress Notes (Signed)
ANTICOAGULATION CONSULT NOTE   Pharmacy Consult for Warfarin  Indication: persistent AFib, anticardiolipin syndrome  Allergies  Allergen Reactions  . Beef-Derived Products Anaphylaxis    From tick bite  . Metoclopramide Hcl Anaphylaxis    Non responsive  . Penicillins Anaphylaxis    Pt states she can take any cephalosporin (per pt. 01/01/19) Shock Has patient had a PCN reaction causing immediate rash, facial/tongue/throat swelling, SOB or lightheadedness with hypotension: Yes Has patient had a PCN reaction causing severe rash involving mucus membranes or skin necrosis: No Has patient had a PCN reaction that required hospitalization Yes Has patient had a PCN reaction occurring within the last 10 years: No If all of the above answers are "NO", then may proceed with Cephalosporin   . Pork-Derived Products Anaphylaxis    From tick bite. Has tolerated heparin/lovenox  . Metoprolol Other (See Comments)    Syncope   . Reglan [Metoclopramide]   . Statins Other (See Comments)    Myalgias    Patient Measurements: Height: 5\' 5"  (165.1 cm) Weight: 89.9 kg (198 lb 4.8 oz) IBW/kg (Calculated) : 57 Heparin dosing weight: 76 kg  Vital Signs: Temp: 98.4 F (36.9 C) (03/17 0730) Temp Source: Oral (03/17 0730) BP: 113/56 (03/17 0935) Pulse Rate: 59 (03/17 0730)  Labs: Recent Labs    03/13/21 0035 03/13/21 0820 03/14/21 0043 03/14/21 1107 03/15/21 0328  HGB 12.9  --   --   --  11.9*  HCT 38.2  --   --   --  36.7  PLT 124*  --   --   --  160  APTT 83* 80* 79*  --   --   LABPROT 26.0*  --  29.7* 23.6* 24.4*  INR 2.5*  --  2.9* 2.2* 2.3*  CREATININE 1.00  --  1.09*  --  0.99    Estimated Creatinine Clearance: 69.5 mL/min (by C-G formula based on SCr of 0.99 mg/dL).   Medical History: Past Medical History:  Diagnosis Date  . Aneurysm of internal carotid artery   . Anticardiolipin antibody positive    Chronic Coumadin  . Brain aneurysm    followed by Dr. 03/17/21  .  Chronic combined systolic (congestive) and diastolic (congestive) heart failure (HCC)   . Cocaine abuse in remission (HCC)   . Coronary atherosclerosis of native coronary artery    Previous invasive cardiac testing was done at a facility in [place], [place].  Occluded diagonal, Diffuse LAD disease, Occluded Cx, and non obstructive RCA.   . Essential hypertension   . Headache   . History of pneumonia   . ICD (implantable cardioverter-defibrillator) in place   . Ischemic cardiomyopathy    LVEF 30-35%  . Mixed hyperlipidemia   . NSVT (nonsustained ventricular tachycardia) (HCC)   . Persistent atrial fibrillation (HCC)   . PVD (peripheral vascular disease) (HCC)   . Raynaud's phenomenon   . Statin intolerance   . Stroke [facility]) 2007   Total occlusion of the right internal carotid    Assessment: [name] with history of persistent Afib and anticardiolipin syndrome on warfarin PTA. S/p R/LHC and successful VT ablation, restarted on warfarin and heparin post-ablation. Pharmacy asked to switch heparin to bivalirudin and to continue warfarin. Bivalirudin stopped 3/16.  INR therapeutic at 2.3. Patient discharging today. Will continue home regimen.  PTA: 5 mg daily except 10 mg Sun/Thurs INR on admit: 2.1, last dose 3/3  Goal of Therapy:  INR 2-3  Monitor platelets by anticoagulation protocol: Yes  Plan:   Continue home regimen on discharge: 5 mg daily except 10 mg on Sun/Thurs    Tama Headings, PharmD, BCPS PGY2 Cardiology Pharmacy Resident Phone: [phone] 03/15/2021  12:30 PM  Please check [email or web address] for unit-specific pharmacy phone numbers.

## 2021-03-15 NOTE — Discharge Instructions (Signed)
NO DRIVING 6 MONTHS 

## 2021-03-15 NOTE — Progress Notes (Signed)
D/C instructions given and reviewed. Tele and IV removed, tolerated well. States husband will be here soon to transport home. Pharmacy at bedside to discuss medication financial concerns.

## 2021-03-15 NOTE — Progress Notes (Signed)
CARDIAC REHAB PHASE I   PRE:  Rate/Rhythm: 61 SR  BP:  Supine: 130/65  Sitting: 94/59  Standing: 99/48   SaO2:   MODE:  Ambulation: 300 ft   POST:  Rate/Rhythm: 72 SR  BP:  Supine:   Sitting: 111/70  Standing:    SaO2: 97%RA 1016-1040 Pt feeling well today. Walked 300 ft on RA with rolling walker and no CP. Tolerated well. Pt stated she is not going to smoke anymore. Knows to begin walking slowly and increasing distance as she feels like it. Stated she will have walker available at home. Pt has had contact with [facility] in [place] and is going to try to attend .    Luetta Nutting, RN BSN  03/15/2021 10:34 AM

## 2021-03-15 NOTE — Anesthesia Postprocedure Evaluation (Signed)
Anesthesia Post Note  Patient: [name] [name]  Procedure(s) Performed: ICD/BIV ICD FUNCTION (DFT) TEST (N/A )     Patient location during evaluation: PACU Anesthesia Type: MAC Level of consciousness: awake and alert Pain management: pain level controlled Vital Signs Assessment: post-procedure vital signs reviewed and stable Respiratory status: spontaneous breathing, nonlabored ventilation, respiratory function stable and patient connected to nasal cannula oxygen Cardiovascular status: stable and blood pressure returned to baseline Postop Assessment: no apparent nausea or vomiting Anesthetic complications: no   No complications documented.  Last Vitals:  Vitals:   03/15/21 0934 03/15/21 0935  BP: (!) 104/48 (!) 113/56  Pulse:    Resp:    Temp:    SpO2:      Last Pain:  Vitals:   03/15/21 0934  TempSrc:   PainSc: 0-No pain                 [name]

## 2021-03-20 ENCOUNTER — Telehealth (HOSPITAL_COMMUNITY): Payer: Self-pay | Admitting: Pharmacist

## 2021-03-20 NOTE — Telephone Encounter (Signed)
Sent in Manufacturer's Assistance application to [employer] for Jardiance.   Application pending, will continue to follow.  Kenedee Molesky, PharmD, BCPS, BCCP, CPP Heart Failure Clinic Pharmacist [phone]  

## 2021-03-26 ENCOUNTER — Telehealth: Payer: Self-pay | Admitting: Emergency Medicine

## 2021-03-26 NOTE — Telephone Encounter (Signed)
Received Latitude alert for 2 episodes of VT treated with ATP. 03/18/21 at 1522, patient had episode of VT in the VT1 zone at 131 bpm that was treated with ATP x 5. The patient reports feeling palpitations but no others symptoms around time of event. 03/21/21 at 0724 patient had VT episode that fell in VT -1 zone at 172 bpm that was treated by ATP x 1 and was asymptomatic. No missed doses of amiodorone 400 mg daily, Coreg 12.5 mg 1/2 tablet BID, Digoxin 0.125 mg daily, Mexitil 200 mg TID. [place] DMV driving restrictions and shock plan reviewed.

## 2021-03-26 NOTE — Telephone Encounter (Signed)
Advanced Heart Failure Patient Advocate Encounter   Patient was approved to receive Jardiance from [employer]  Effective dates: 03/21/21 through 12/29/21  Called and spoke with patient.  Archer Asa, CPhT

## 2021-03-27 ENCOUNTER — Ambulatory Visit (INDEPENDENT_AMBULATORY_CARE_PROVIDER_SITE_OTHER): Payer: Medicare PPO | Admitting: *Deleted

## 2021-03-27 DIAGNOSIS — I63549 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: Secondary | ICD-10-CM | POA: Diagnosis not present

## 2021-03-27 DIAGNOSIS — I4819 Other persistent atrial fibrillation: Secondary | ICD-10-CM | POA: Diagnosis not present

## 2021-03-27 DIAGNOSIS — D6861 Antiphospholipid syndrome: Secondary | ICD-10-CM | POA: Diagnosis not present

## 2021-03-27 DIAGNOSIS — D6859 Other primary thrombophilia: Secondary | ICD-10-CM | POA: Diagnosis not present

## 2021-03-27 DIAGNOSIS — Z5181 Encounter for therapeutic drug level monitoring: Secondary | ICD-10-CM | POA: Diagnosis not present

## 2021-03-27 LAB — POCT INR: INR: 4 — AB (ref 2.0–3.0)

## 2021-03-27 NOTE — Patient Instructions (Signed)
Hold warfarin tonight then decrease dose to 1/2 tablet daily Recheck in 1 wk 

## 2021-03-28 ENCOUNTER — Encounter (HOSPITAL_COMMUNITY): Payer: Self-pay | Admitting: Cardiology

## 2021-03-28 ENCOUNTER — Ambulatory Visit (HOSPITAL_COMMUNITY)
Admit: 2021-03-28 | Discharge: 2021-03-28 | Disposition: A | Discharge status: Home / Self Care | Payer: Medicare PPO | Attending: Cardiology | Admitting: Cardiology

## 2021-03-28 ENCOUNTER — Other Ambulatory Visit: Payer: Self-pay

## 2021-03-28 ENCOUNTER — Telehealth (HOSPITAL_COMMUNITY): Payer: Self-pay

## 2021-03-28 VITALS — BP 104/60 | HR 56 | Wt 201.4 lb

## 2021-03-28 DIAGNOSIS — I255 Ischemic cardiomyopathy: Secondary | ICD-10-CM | POA: Insufficient documentation

## 2021-03-28 DIAGNOSIS — I5022 Chronic systolic (congestive) heart failure: Secondary | ICD-10-CM

## 2021-03-28 DIAGNOSIS — E785 Hyperlipidemia, unspecified: Secondary | ICD-10-CM | POA: Insufficient documentation

## 2021-03-28 DIAGNOSIS — Z955 Presence of coronary angioplasty implant and graft: Secondary | ICD-10-CM | POA: Diagnosis not present

## 2021-03-28 DIAGNOSIS — Z8249 Family history of ischemic heart disease and other diseases of the circulatory system: Secondary | ICD-10-CM | POA: Diagnosis not present

## 2021-03-28 DIAGNOSIS — D696 Thrombocytopenia, unspecified: Secondary | ICD-10-CM | POA: Diagnosis not present

## 2021-03-28 DIAGNOSIS — F419 Anxiety disorder, unspecified: Secondary | ICD-10-CM | POA: Insufficient documentation

## 2021-03-28 DIAGNOSIS — I252 Old myocardial infarction: Secondary | ICD-10-CM | POA: Insufficient documentation

## 2021-03-28 DIAGNOSIS — I6521 Occlusion and stenosis of right carotid artery: Secondary | ICD-10-CM | POA: Diagnosis not present

## 2021-03-28 DIAGNOSIS — D6861 Antiphospholipid syndrome: Secondary | ICD-10-CM | POA: Diagnosis not present

## 2021-03-28 DIAGNOSIS — I472 Ventricular tachycardia, unspecified: Secondary | ICD-10-CM

## 2021-03-28 DIAGNOSIS — Z95810 Presence of automatic (implantable) cardiac defibrillator: Secondary | ICD-10-CM | POA: Diagnosis not present

## 2021-03-28 DIAGNOSIS — Z7902 Long term (current) use of antithrombotics/antiplatelets: Secondary | ICD-10-CM | POA: Diagnosis not present

## 2021-03-28 DIAGNOSIS — I2510 Atherosclerotic heart disease of native coronary artery without angina pectoris: Secondary | ICD-10-CM | POA: Insufficient documentation

## 2021-03-28 DIAGNOSIS — Z7901 Long term (current) use of anticoagulants: Secondary | ICD-10-CM | POA: Diagnosis not present

## 2021-03-28 DIAGNOSIS — Z8673 Personal history of transient ischemic attack (TIA), and cerebral infarction without residual deficits: Secondary | ICD-10-CM | POA: Diagnosis not present

## 2021-03-28 DIAGNOSIS — I110 Hypertensive heart disease with heart failure: Secondary | ICD-10-CM | POA: Insufficient documentation

## 2021-03-28 DIAGNOSIS — I4819 Other persistent atrial fibrillation: Secondary | ICD-10-CM | POA: Diagnosis not present

## 2021-03-28 DIAGNOSIS — Z79899 Other long term (current) drug therapy: Secondary | ICD-10-CM | POA: Diagnosis not present

## 2021-03-28 DIAGNOSIS — F17200 Nicotine dependence, unspecified, uncomplicated: Secondary | ICD-10-CM | POA: Diagnosis not present

## 2021-03-28 DIAGNOSIS — K219 Gastro-esophageal reflux disease without esophagitis: Secondary | ICD-10-CM | POA: Insufficient documentation

## 2021-03-28 LAB — COMPREHENSIVE METABOLIC PANEL
ALT: 25 U/L (ref 0–44)
AST: 20 U/L (ref 15–41)
Albumin: 3.1 g/dL — ABNORMAL LOW (ref 3.5–5.0)
Alkaline Phosphatase: 69 U/L (ref 38–126)
Anion gap: 7 (ref 5–15)
BUN: 12 mg/dL (ref 6–20)
CO2: 27 mmol/L (ref 22–32)
Calcium: 8.8 mg/dL — ABNORMAL LOW (ref 8.9–10.3)
Chloride: 102 mmol/L (ref 98–111)
Creatinine, Ser: 0.88 mg/dL (ref 0.44–1.00)
GFR, Estimated: >60 mL/min (ref >60)
Glucose, Bld: 97 mg/dL (ref 70–99)
Potassium: 4.5 mmol/L (ref 3.5–5.1)
Sodium: 136 mmol/L (ref 135–145)
Total Bilirubin: 0.4 mg/dL (ref 0.3–1.2)
Total Protein: 6.6 g/dL (ref 6.5–8.1)

## 2021-03-28 LAB — DIGOXIN LEVEL: Digoxin Level: 2 ng/mL (ref 0.8–2.0)

## 2021-03-28 LAB — CBC
HCT: 40.5 % (ref 36.0–46.0)
Hemoglobin: 13 g/dL (ref 12.0–15.0)
MCH: 29.7 pg (ref 26.0–34.0)
MCHC: 32.1 g/dL (ref 30.0–36.0)
MCV: 92.7 fL (ref 80.0–100.0)
Platelets: UNDETERMINED 10*3/uL (ref 150–400)
RBC: 4.37 MIL/uL (ref 3.87–5.11)
RDW: 13.2 % (ref 11.5–15.5)
WBC: 7 10*3/uL (ref 4.0–10.5)
nRBC: 0 % (ref 0.0–0.2)

## 2021-03-28 LAB — TSH: TSH: 5.156 u[IU]/mL — ABNORMAL HIGH (ref 0.350–4.500)

## 2021-03-28 MED ORDER — CARVEDILOL 6.25 MG PO TABS: 9.3750 mg | ORAL_TABLET | Freq: Two times a day (BID) | ORAL | 11 refills | Status: DC

## 2021-03-28 MED ORDER — PANTOPRAZOLE SODIUM 40 MG PO TBEC: 40.0000 mg | DELAYED_RELEASE_TABLET | Freq: Every day | ORAL | 11 refills | Status: DC

## 2021-03-28 MED ORDER — ALPRAZOLAM 0.25 MG PO TABS: 0.2500 mg | ORAL_TABLET | Freq: Every evening | ORAL | 2 refills | Status: DC | PRN

## 2021-03-28 NOTE — Telephone Encounter (Signed)
-----   Message from [name], MD sent at 03/28/2021  1:45 PM EDT ----- Digoxin level is very high.  She needs to stop digoxin for now.  Need to repeat TSH and check free T3 and free T4.

## 2021-03-28 NOTE — Progress Notes (Signed)
PCP: Ignatius Specking, MD Cardiology: Dr. Diona Browner HF Cardiology: Dr. Shirlee Latch  60 y.o. with history of CVA, anti-phospholipid antibody syndrome, CAD, ischemic cardiomyopathy, and persistent atrial fibrillation was referred by Dr. Diona Browner for evaluation of CHF. Patient had a cath in 2008 in setting of an MI in [place].  Diagonal and LCx were totally occluded, there was diffuse LAD disease.  She has not had a cath since that time. She has a Environmental manager ICD.  Echo in 8/21 showed EF 15-20%, anteroseptal akinesis, normal RV, severe LAE, mild MR. She was admitted in 8/21 with CHF exacerbation for 6 days.  She went into atrial fibrillation in 11/21.   In 12/21, she had TEE-guided DCCV back to NSR.  TEE showed EF 25-30%, mildly decreased RV systolic function, moderate MR.    Later in 12/21, she was admitted with syncope and VT with ICD shock.  RHC/LHC showed chronically occluded D1 and 95% proximal LCx; normal filling pressures with CI 3.0. Platelets were low, no intervention was done. Amiodarone was started.   In 1/22, she was admitted again with VT and presyncope, HS-TnI was negative.  She had PCI to LCx this admission.  She was started on mexiletine in addition to amiodarone.   She was readmitted later in 1/22 with chest pain, HS-TnI was negative and she was discharged.   She was admitted again in 3/22 with recurrent VT.  LHC/RHC showed totally occluded D1 (chronic) with patent LCx stent and filling pressures were normal with normal CO.  She had a VT ablation with Dr. Elberta Fortis.   She returns for followup of CHF and CAD.  Since last discharge, she has had an additional run of sustained VT treated by ATP (no ICD discharge).  Weight is stable.  Shes has felt a "weight" on her chest ever since she was in the hospital.  She is very anxious and finds it hard to sleep.  She is not short of breath walking on flat ground.  No lightheadedness. She continues to smoke.   ECG (personally reviewed): NSR, 1st degree AVB,  nonspecific TWF    Labs (8/21): K 4.4, creatinine 0.58, LDL 94  Labs (12/21): LDL 72, HDL 31, TGs 85, K 4.4, creatinine 0.8, hgb 14.6, plts 70K Labs (1/22): K 3.3, creatinine 0.94, plts 58K Labs (3/22): K 3.8, creatinine 0.99, hgb 11.9, plts 160  PMH: 1. Brain aneurysm: Followed by Dr. Corliss Skains.  2. HTN 3. CVA: Total occlusion of RICA.  - Carotid dopplers (12/21): Occluded RICA, < 50% stenosis LICA.  - Carotid dopplers (3/22): Occluded RICA, 1-39% LICA 4. Anti-phospholipid antibody syndrome: Diagnosed at [facility].  - On warfarin and ASA 81 given history of CVA.  5. Remote cocaine abuse.  6. PAD: peripheral arterial dopplers in 3/21 with early left-sided iliac disease, right anterior tibial disease; ABI 1.0 on right and 0.75 on left.  7. Active smoker.  8. L-spine arthritis.  9. CAD: MI in 2008 in [facility], cath at the time showed totally occluded diagonal, totally occluded LCX, and diffuse LAD disease.  No intervention.   - LHC (12/21): chronically occluded D1, 45% mid LAD, 95% proximal LCx.  - PCI to proximal LCx in 1/22.  - LHC (3/22): CTO D1, patent LCx stent.  10. Atrial fibrillation: Persistent, started in 11/21.  - TEE-DCCV back to NSR in 12/21.  11. Chronic systolic CHF: Ischemic cardiomyopathy.  [employer] ICD.  - Echo (8/21): EF 15-20%, anteroseptal AK, RV normal, severe LAE, mild MR.  - TEE (  12/21): EF 25-30%, mildly decreased RV systolic function, moderate MR with ERO 0.26 cm^2 (probably functional).   - RHC (12/21): mean RA 1, PA 24/6, mean PCWP 12, CI 3.0 - RHC (3/22): mean RA 1, PA 19/4, mean PCWP 7, CI 2.69 12. Hyperlipidemia: Statin intolerant.  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   14. Mitral regurgitation: Moderate on 12/21 TEE.  15. Ventricular tachycardia  FH: Father, grandfather with MIs.  Brother with MI in his 62s.   SH: Lives in [place], on disability, smokes 4 cigs/day.  Prior cocaine.  No ETOH.   ROS: All systems reviewed and negative except  as per HPI.   Current Outpatient Medications  Medication Sig Dispense Refill  . ALPRAZolam (XANAX) 0.25 MG tablet Take 1 tablet (0.25 mg total) by mouth at bedtime as needed for anxiety. 30 tablet 2  . amiodarone (PACERONE) 400 MG tablet Take 1 tablet (400 mg total) by mouth daily. 30 tablet 6  . clopidogrel (PLAVIX) 75 MG tablet Take 1 tablet (75 mg total) by mouth daily with breakfast. 30 tablet 11  . digoxin (LANOXIN) 0.125 MG tablet Take by mouth.    . DULoxetine (CYMBALTA) 60 MG capsule Take 60 mg by mouth every evening.    . empagliflozin (JARDIANCE) 10 MG TABS tablet Take 1 tablet (10 mg total) by mouth daily before breakfast. 30 tablet 3  . ezetimibe (ZETIA) 10 MG tablet Take 1 tablet (10 mg total) by mouth daily. 30 tablet 3  . fluticasone (FLONASE) 50 MCG/ACT nasal spray Place 1 spray into both nostrils daily as needed for allergies.     . isosorbide mononitrate (IMDUR) 30 MG 24 hr tablet Take 0.5 tablets (15 mg total) by mouth daily. 15 tablet 6  . methocarbamol (ROBAXIN) 750 MG tablet Take 750 mg by mouth every 8 (eight) hours as needed for muscle spasms.    Marland Kitchen mexiletine (MEXITIL) 200 MG capsule Take 1 capsule (200 mg total) by mouth every 8 (eight) hours. 90 capsule 7  . Multiple Vitamins-Minerals (MULTIVITAMINS THER. W/MINERALS) TABS Take 1 tablet by mouth daily.      . nitroGLYCERIN (NITROSTAT) 0.4 MG SL tablet PLACE 1 TABLET UNDER THE TONGUE EVERY 5 MINUTES FOR 3 DOSES AS NEEDED CHEST PAIN 25 tablet 3  . oxyCODONE-acetaminophen (PERCOCET) 10-325 MG tablet Take 1 tablet by mouth every 4 (four) hours as needed for pain.    . pantoprazole (PROTONIX) 40 MG tablet Take 1 tablet (40 mg total) by mouth daily. 30 tablet 11  . potassium chloride SA (KLOR-CON) 20 MEQ tablet Take 2 tablets (40 mEq total) by mouth daily. 180 tablet 1  . promethazine (PHENERGAN) 25 MG tablet Take 25 mg by mouth every 6 (six) hours as needed for nausea. With pain medication    . sacubitril-valsartan (ENTRESTO)  24-26 MG Take 1 tablet by mouth 2 (two) times daily.    Marland Kitchen spironolactone (ALDACTONE) 25 MG tablet Take 1 tablet (25 mg total) by mouth daily. 30 tablet 3  . torsemide 60 MG TABS Take 60 mg by mouth daily. 30 tablet 6  . warfarin (COUMADIN) 10 MG tablet TAKE 1/2 TABLET BY MOUTH EVERY DAY EXCEPT 1 TABLET ON SUNDAYS AND THURSDAYS (Patient taking differently: Take 5-10 mg by mouth See admin instructions. 5 mg Monday,Tuesday,Wednesday,Friday,Saturday. 10 mg Sunday and thursday) 30 tablet 3  . zolpidem (AMBIEN) 10 MG tablet Take 10 mg by mouth at bedtime as needed for sleep.    . carvedilol (COREG) 6.25 MG tablet Take 1.5 tablets (9.375  mg total) by mouth 2 (two) times daily. 90 tablet 11   No current facility-administered medications for this encounter.   BP 104/60   Pulse (!) 56   Wt 91.4 kg (201 lb 6.4 oz)   SpO2 96%   BMI 33.51 kg/m  General: NAD Neck: No JVD, no thyromegaly or thyroid nodule.  Lungs: Clear to auscultation bilaterally with normal respiratory effort. CV: Nondisplaced PMI.  Heart regular S1/S2, no S3/S4, no murmur.  No peripheral edema.  No carotid bruit.  Normal pedal pulses.  Abdomen: Soft, nontender, no hepatosplenomegaly, no distention.  Skin: Intact without lesions or rashes.  Neurologic: Alert and oriented x 3.  Psych: Normal affect. Extremities: No clubbing or cyanosis.  HEENT: Normal.   Assessment/Plan: 1. CAD: MI in 2008 in [facility], cath at the time showed totally occluded diagonal, totally occluded LCX, and diffuse LAD disease.  No intervention. LHC was done in 12/21 due to VT, this showed chronically occluded D1 and 95% proximal LCx.  Initially, no PCI due to thrombocytopenia.  She eventually had PCI to proximal LCx in 1/22.  Repeat echo in 3/22 showed stable CTO D1 and patent LCx stent.  - Continue Zetia, has not tolerated statins.  Good lipids in 12/21.  - Continue Plavix 75 mg daily x at least 6 months and ideally 1 year post-PCI. - She is off ASA given use of  warfarin and Plavix.  2. Atrial fibrillation: TEE-guided DCCV to NSR in 12/21.  She is in NSR today.   - If atrial fibrillation recurs, will need to consider Tikosyn versus ablation.  - Continue warfarin.   3. Carotid stenosis: Known RICA occlusion.   - Repeat dopplers in 3/23.  4. 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  - She is on warfarin.  5. Chronic systolic CHF: Ischemic cardiomyopathy.  [employer] ICD.  Echo (8/21) with EF 15-20%, anteroseptal AK, RV normal, severe LAE, mild MR.  TEE (12/21) with EF 25-30%.  RHC in 12/21 and again in 3/22 with preserved cardiac output and optimized filling pressures.  Probably NYHA class II symptoms, not volume overloaded on exam.   - Continue Entresto 24/26 bid, BP relatively soft and will not increase today.  - Continue empagliflozin 10 mg daily.   - Continue spironolactone 25 mg daily.  - Increase Coreg to 9.375 mg bid.  - Continue torsemide 60 mg daily, check BMET today.  - Continue digoxin 0.0625 daily, check digoxin level.  6. Smoking: Active smoker, wants to stop on her own.  7. PAD: She denies claudication.  - Needs to quit smoking.  8. 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  - Repeat CBC today.  - Has seen hematology.  9. Mitral regurgitation: Moderate on 12/21 TEE, suspect functional.  10. VT: Suspect scar-mediated in 12/21 with ICD discharge and syncope. She had recurrent VT in 3/22.  She had VT ablation in 3/22.  Since she has been home, she had a run of VT terminated by ATP (no shocks).  She is now on amiodarone + mexiletine.   - No driving x 6 months.  - Continue amiodarone 400 mg daily for now, check LFTs and TSH today.  She will need a regular eye exam.  Will not change amiodarone dosing (will leave to EP to determine time-line of decreasing.  - Continue mexiletine.  - Increase Coreg to 9.375 mg bid. Continue to titrate up if BP tolerates.  - She has followup with Dr. Ladona Ridgel.  11. GERD: She  continues  to have heartburn.  Stop famotidine, start Protonix 40 mg daily.  12. Anxiety: Very prominent.  She is on Cymbalta.  Having a lot of trouble sleeping.  - Will give her prescription for Xanax 0.25 mg to be taken at qhs prn.   Followup in 6 wks.   [name] 03/28/2021

## 2021-03-28 NOTE — Telephone Encounter (Signed)
Continue current meds 

## 2021-03-28 NOTE — Patient Instructions (Addendum)
EKG done today.  Labs done today. We will contact you only if your labs are abnormal.   To start receiving the Shipments for your Jardiance please call [phone]   STOP taking Famotidine  START Pantoprazole(Protonix) 40mg  (1 tablet) by mouth daily.  START Xanax 0.25mg  (1 tablet) by mouth at bedtime as needed for anxiety. (a paper Rx was given to you today in office)  INCREASE Carvedilol (Coreg) to 9.375 (1 & 1/2 tablets) by mouth 2 times daily.   No other medication changes were made. Please continue all current medications as prescribed.  Your physician recommends that you schedule a follow-up appointment in: 6 weeks   If you have any questions or concerns before your next appointment please send a message through [facility] or call our office at [phone].    TO LEAVE A MESSAGE FOR THE NURSE SELECT OPTION 2, PLEASE LEAVE A MESSAGE INCLUDING: . YOUR NAME . DATE OF BIRTH . CALL BACK NUMBER . REASON FOR CALL**this is important as we prioritize the call backs  YOU WILL RECEIVE A CALL BACK THE SAME DAY AS LONG AS YOU CALL BEFORE 4:00 PM   Do the following things EVERYDAY: 1) Weigh yourself in the morning before breakfast. Write it down and keep it in a log. 2) Take your medicines as prescribed 3) Eat low salt foods--Limit salt (sodium) to 2000 mg per day.  4) Stay as active as you can everyday 5) Limit all fluids for the day to less than 2 liters   At the [facility], you and your health needs are our priority. As part of our continuing mission to provide you with exceptional heart care, we have created designated Provider Care Teams. These Care Teams include your primary Cardiologist (physician) and Advanced Practice Providers (APPs- Physician Assistants and Nurse Practitioners) who all work together to provide you with the care you need, when you need it.   You may see any of the following providers on your designated Care Team at your next follow  up: [phone] Dr Marland Kitchen . Dr [name] . [name], NP . Tonye Becket, PA . Robbie Lis, PharmD   Please be sure to bring in all your medications bottles to every appointment.

## 2021-03-28 NOTE — Telephone Encounter (Signed)
Patient advised and verbalized understanding. Med list updated to reflect changes,lab orders entered,lab appt scheduled.   Orders Placed This Encounter  Procedures  . TSH    Standing Status:   Future    Standing Expiration Date:   03/28/2022    Order Specific Question:   Release to patient    Answer:   Immediate  . T3, free    Standing Status:   Future    Standing Expiration Date:   03/28/2022    Order Specific Question:   Release to patient    Answer:   Immediate  . T4, free    Standing Status:   Future    Standing Expiration Date:   03/28/2022    Order Specific Question:   Release to patient    Answer:   Immediate

## 2021-03-29 NOTE — Telephone Encounter (Signed)
Contacted patient and advised her that Dr. Lubertha Basque recommendations were to continue current medications. Patient verbalized understanding.

## 2021-04-02 ENCOUNTER — Other Ambulatory Visit: Payer: Self-pay

## 2021-04-02 ENCOUNTER — Telehealth: Payer: Self-pay

## 2021-04-02 ENCOUNTER — Ambulatory Visit (HOSPITAL_COMMUNITY)
Admission: RE | Admit: 2021-04-02 | Discharge: 2021-04-02 | Disposition: A | Discharge status: Home / Self Care | Payer: Medicare PPO | Source: Ambulatory Visit | Attending: Internal Medicine | Admitting: Internal Medicine

## 2021-04-02 DIAGNOSIS — I5022 Chronic systolic (congestive) heart failure: Secondary | ICD-10-CM | POA: Insufficient documentation

## 2021-04-02 LAB — BASIC METABOLIC PANEL
Anion gap: 12 (ref 5–15)
BUN: 9 mg/dL (ref 6–20)
CO2: 22 mmol/L (ref 22–32)
Calcium: 8.8 mg/dL — ABNORMAL LOW (ref 8.9–10.3)
Chloride: 101 mmol/L (ref 98–111)
Creatinine, Ser: 0.76 mg/dL (ref 0.44–1.00)
GFR, Estimated: >60 mL/min (ref >60)
Glucose, Bld: 146 mg/dL — ABNORMAL HIGH (ref 70–99)
Potassium: 3.7 mmol/L (ref 3.5–5.1)
Sodium: 135 mmol/L (ref 135–145)

## 2021-04-02 LAB — DIGOXIN LEVEL: Digoxin Level: 0.2 ng/mL — ABNORMAL LOW (ref 0.8–2.0)

## 2021-04-02 LAB — TSH: TSH: 2.303 u[IU]/mL (ref 0.350–4.500)

## 2021-04-02 LAB — T4, FREE: Free T4: 1.17 ng/dL — ABNORMAL HIGH (ref 0.61–1.12)

## 2021-04-02 NOTE — Telephone Encounter (Signed)
Alert transmission for Antitachycardia pacing (ATP) therapy delivered to convert arrhythmia. 1 VT event in VT-1 zone w/ detection set at 140bpm, of note arrhythmia falls at or just below detection at time ATP finally delivered and successful conversion to sinus rhythm.  Patient is s/p VT ablation 03/08/21. Meds: Amiodarone, Coreg, Mexiletine, Entresto, Warfarin (per notes Digoxin on hold due to elevated digoxin levels).    Pt has f/u appt scheduled with Dr. Ladona Ridgel on 04/17/21.   Attempted to reach pt.  No answer.  LVM requesting callback with device clinic # and hours.

## 2021-04-03 ENCOUNTER — Encounter (HOSPITAL_COMMUNITY): Payer: Self-pay

## 2021-04-03 DIAGNOSIS — G4700 Insomnia, unspecified: Secondary | ICD-10-CM | POA: Diagnosis not present

## 2021-04-03 DIAGNOSIS — Z79891 Long term (current) use of opiate analgesic: Secondary | ICD-10-CM | POA: Diagnosis not present

## 2021-04-03 DIAGNOSIS — G8929 Other chronic pain: Secondary | ICD-10-CM | POA: Diagnosis not present

## 2021-04-03 DIAGNOSIS — G8912 Acute post-thoracotomy pain: Secondary | ICD-10-CM | POA: Diagnosis not present

## 2021-04-03 DIAGNOSIS — M25569 Pain in unspecified knee: Secondary | ICD-10-CM | POA: Diagnosis not present

## 2021-04-03 DIAGNOSIS — M545 Low back pain, unspecified: Secondary | ICD-10-CM | POA: Diagnosis not present

## 2021-04-03 LAB — T3, FREE: T3, Free: 2.3 pg/mL (ref 2.0–4.4)

## 2021-04-04 ENCOUNTER — Other Ambulatory Visit: Payer: Self-pay

## 2021-04-04 ENCOUNTER — Ambulatory Visit (INDEPENDENT_AMBULATORY_CARE_PROVIDER_SITE_OTHER): Payer: Medicare PPO | Admitting: *Deleted

## 2021-04-04 ENCOUNTER — Emergency Department (HOSPITAL_COMMUNITY)
Admission: EM | Admit: 2021-04-04 | Discharge: 2021-04-04 | Discharge status: Against Medical Advice | Payer: Medicare PPO

## 2021-04-04 ENCOUNTER — Emergency Department (HOSPITAL_COMMUNITY)
Admission: EM | Admit: 2021-04-04 | Discharge: 2021-04-04 | Disposition: A | Discharge status: Home / Self Care | Payer: Medicare PPO | Attending: Emergency Medicine | Admitting: Emergency Medicine

## 2021-04-04 ENCOUNTER — Telehealth: Payer: Self-pay

## 2021-04-04 DIAGNOSIS — Z7902 Long term (current) use of antithrombotics/antiplatelets: Secondary | ICD-10-CM | POA: Diagnosis not present

## 2021-04-04 DIAGNOSIS — Z79899 Other long term (current) drug therapy: Secondary | ICD-10-CM | POA: Diagnosis not present

## 2021-04-04 DIAGNOSIS — Z5181 Encounter for therapeutic drug level monitoring: Secondary | ICD-10-CM

## 2021-04-04 DIAGNOSIS — I5042 Chronic combined systolic (congestive) and diastolic (congestive) heart failure: Secondary | ICD-10-CM | POA: Diagnosis not present

## 2021-04-04 DIAGNOSIS — I63549 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: Secondary | ICD-10-CM | POA: Diagnosis not present

## 2021-04-04 DIAGNOSIS — I2510 Atherosclerotic heart disease of native coronary artery without angina pectoris: Secondary | ICD-10-CM | POA: Insufficient documentation

## 2021-04-04 DIAGNOSIS — R002 Palpitations: Secondary | ICD-10-CM

## 2021-04-04 DIAGNOSIS — F17210 Nicotine dependence, cigarettes, uncomplicated: Secondary | ICD-10-CM | POA: Diagnosis not present

## 2021-04-04 DIAGNOSIS — I110 Hypertensive heart disease with heart failure: Secondary | ICD-10-CM | POA: Insufficient documentation

## 2021-04-04 DIAGNOSIS — Z955 Presence of coronary angioplasty implant and graft: Secondary | ICD-10-CM | POA: Diagnosis not present

## 2021-04-04 DIAGNOSIS — R0602 Shortness of breath: Secondary | ICD-10-CM | POA: Insufficient documentation

## 2021-04-04 DIAGNOSIS — Z7901 Long term (current) use of anticoagulants: Secondary | ICD-10-CM | POA: Diagnosis not present

## 2021-04-04 DIAGNOSIS — R42 Dizziness and giddiness: Secondary | ICD-10-CM | POA: Diagnosis not present

## 2021-04-04 DIAGNOSIS — D6859 Other primary thrombophilia: Secondary | ICD-10-CM | POA: Diagnosis not present

## 2021-04-04 LAB — POCT INR: INR: 1.6 — AB (ref 2.0–3.0)

## 2021-04-04 LAB — BASIC METABOLIC PANEL
Anion gap: 8 (ref 5–15)
BUN: 18 mg/dL (ref 6–20)
CO2: 28 mmol/L (ref 22–32)
Calcium: 8.9 mg/dL (ref 8.9–10.3)
Chloride: 98 mmol/L (ref 98–111)
Creatinine, Ser: 1 mg/dL (ref 0.44–1.00)
GFR, Estimated: >60 mL/min (ref >60)
Glucose, Bld: 89 mg/dL (ref 70–99)
Potassium: 4.1 mmol/L (ref 3.5–5.1)
Sodium: 134 mmol/L — ABNORMAL LOW (ref 135–145)

## 2021-04-04 LAB — CBC
HCT: 43.5 % (ref 36.0–46.0)
Hemoglobin: 14.2 g/dL (ref 12.0–15.0)
MCH: 29.8 pg (ref 26.0–34.0)
MCHC: 32.6 g/dL (ref 30.0–36.0)
MCV: 91.4 fL (ref 80.0–100.0)
Platelets: 152 10*3/uL (ref 150–400)
RBC: 4.76 MIL/uL (ref 3.87–5.11)
RDW: 13.3 % (ref 11.5–15.5)
WBC: 8.3 10*3/uL (ref 4.0–10.5)
nRBC: 0 % (ref 0.0–0.2)

## 2021-04-04 LAB — TROPONIN I (HIGH SENSITIVITY): Troponin I (High Sensitivity): 18 ng/L — ABNORMAL HIGH (ref <18)

## 2021-04-04 NOTE — Telephone Encounter (Signed)
Patient called in and states that she felt something in her chest not a shock but she thinks her ICD paced her out. She has no chest pain she is just afraid to get up as she has some feeling in the top of her head. Patient states her monitor is not working correctly and she called tech support and still not working. Per Joey (BSX) he states for the patient to bring her monitor with her and have her plug it up at the office and see if we can help her get it working. (if she needs to come in) he has no monitors to give her otherwise call tech support back and get one ordered

## 2021-04-04 NOTE — Telephone Encounter (Signed)
The patient left a message for the nurse to call her back about her transmission.

## 2021-04-04 NOTE — Discharge Instructions (Addendum)
No events today based on your interrogation.  Labs all looked good.

## 2021-04-04 NOTE — Telephone Encounter (Signed)
Attempted to contact patient. No answer, LMOVM. 

## 2021-04-04 NOTE — Telephone Encounter (Signed)
Returning patients phone call.  Patient states she is currently in the ER waiting to be seen to have her device interrogated. States she is very anxious and feels like she received pacing. States she feels good at this time and has no complaints. During our conversation patient was called to triage in the ED. Advised patient to call us back with update.

## 2021-04-04 NOTE — Patient Instructions (Signed)
Increase warfarin to 1/2 tablet daily except 1 tablet on Wednesdays and Saturdays Recheck in 1 wk

## 2021-04-04 NOTE — ED Triage Notes (Addendum)
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 Pt states that earlier today she had an episode when her pacer went off and paced her heart but did not defibrillate. Pt denies chest pain but stated some shortness of breath and some fuzziness to the head, during the cardiac event. Pt states she feels normal right now.  Pt states several episodes of her pacemaker going off over the last week.

## 2021-04-04 NOTE — Telephone Encounter (Signed)
Patient reports She was awake during this event that occurred. States she was watching tv during this episode and was asymptomatic. Reports compliance with all medications including holding digoxin at this time. Patient made aware of [place] driving restrictions x6 months with start date on 03/31/21. Shock plan reviewed. Advised patient I will forward to Dr. Ladona Ridgel for review and we will call with any changes. Patient agreeable to plan.

## 2021-04-04 NOTE — ED Provider Notes (Addendum)
[facility] EMERGENCY DEPARTMENT Provider Note   CSN: [id number] Arrival date & time: 04/04/21  1503     History Chief Complaint  Patient presents with  . Shortness of Breath    [name] [name] is a 60 y.o. female.  Patient is a 60 year old female with a history of CHF, prior brain aneurysm, V. tach requiring being paced out as she had low rate that was not defibrillated status post defibrillator on amiodarone and Mexitil who is presenting today because she was concerned she may have gone into V. tach earlier today around 2:00.  Patient reports she had been having a normal day she went and had her Coumadin level checked and it was low at 1.6 because it had been adjusted because it had been too high.  After getting home she was sitting down and she stood up and felt a weird tingling on the left side of her head and a slight fluttering dizziness.  This did resolve spontaneously she denied any shortness of breath or chest pain during this episode but reports that it is the similar sensation she had when she had an episode of ventricular tachycardia resulting in syncope.  She became very nervous and tried to send a transmission from her defibrillator but her device at home was not working.  She then became very anxious and came to the emergency room to be evaluated.  She currently has no complaints at this time.  She denies recently feeling ill and has been doing well at home.  She has not missed any doses of her medication.  The history is provided by the patient and medical records.  Shortness of Breath      Past Medical History:  Diagnosis Date  . Aneurysm of internal carotid artery   . Anticardiolipin antibody positive    Chronic Coumadin  . Brain aneurysm    followed by Dr. Corliss Skains  . Chronic combined systolic (congestive) and diastolic (congestive) heart failure (HCC)   . Cocaine abuse in remission (HCC)   . Coronary atherosclerosis of native coronary artery     Previous invasive cardiac testing was done at a facility in [place], [place].  Occluded diagonal, Diffuse LAD disease, Occluded Cx, and non obstructive RCA.   . Essential hypertension   . Headache   . History of pneumonia   . ICD (implantable cardioverter-defibrillator) in place   . Ischemic cardiomyopathy    LVEF 30-35%  . Mixed hyperlipidemia   . NSVT (nonsustained ventricular tachycardia) (HCC)   . Persistent atrial fibrillation (HCC)   . PVD (peripheral vascular disease) (HCC)   . Raynaud's phenomenon   . Statin intolerance   . Stroke [facility]) 2007   Total occlusion of the right internal carotid    Patient Active Problem List   Diagnosis Date Noted  . Shortness of breath   . Unstable angina (HCC) 01/11/2021  . Defibrillator discharge   . VT (ventricular tachycardia) (HCC) 01/04/2021  . CAD in native artery   . Persistent atrial fibrillation (HCC) 12/15/2020  . DM II (diabetes mellitus, type II), controlled (HCC) 12/15/2020  . Chronic systolic CHF (congestive heart failure) (HCC) 12/15/2020  . Ventricular tachycardia (HCC) 12/15/2020  . Right carotid artery occlusion   . Syncope 12/14/2020  . Acute respiratory failure with hypoxia (HCC) 08/11/2020  . Hypokalemia 08/11/2020  . GERD (gastroesophageal reflux disease) 08/11/2020  . Allergic rhinitis 08/11/2020  . SIRS (systemic inflammatory response syndrome) (HCC) 01/06/2019  . HCAP (healthcare-associated pneumonia) 12/31/2018  .  H. influenzae infection 12/24/2018  . Influenza A 12/24/2018  . Chest pain 06/18/2017  . Hyponatremia 06/18/2017  . Tobacco dependence 06/18/2017  . 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 Left-sided weakness   . Chronic combined systolic (congestive) and diastolic (congestive) heart failure (HCC) 07/26/2015  . Encounter for therapeutic drug monitoring 02/02/2014  . Peripheral arterial disease (HCC) 11/09/2013  . Dual implantable  cardioverter-defibrillator in situ 06/01/2012  . Long term current use of anticoagulant 03/22/2011  . Essential hypertension, benign 05/29/2010  . CHEST PAIN 05/29/2010  . Hyperlipidemia 09/26/2009  . Peripheral vascular disease (HCC) 06/23/2009  . Cardiomyopathy, ischemic 11/29/2008  . CEREBROVASCULAR DISEASE 11/29/2008  . Anticardiolipin syndrome (HCC) 11/29/2008    Past Surgical History:  Procedure Laterality Date  . ABDOMINAL AORTAGRAM N/A 05/25/2014   Procedure: ABDOMINAL Ronny Flurry;  Surgeon: Iran Ouch, MD;  Location: [facility] CATH LAB;  Service: Cardiovascular;  Laterality: N/A;  . APPENDECTOMY    . CARDIAC DEFIBRILLATOR PLACEMENT     [employer] ICD  . CARDIOVERSION N/A 12/01/2020   Procedure: CARDIOVERSION;  Surgeon: [name], MD;  Location: [facility] ENDOSCOPY;  Service: Cardiovascular;  Laterality: N/A;  . CORONARY STENT INTERVENTION N/A 01/08/2021   Procedure: CORONARY STENT INTERVENTION;  Surgeon: Swaziland, Peter M, MD;  Location: [facility] INVASIVE CV LAB;  Service: Cardiovascular;  Laterality: N/A;  . EP IMPLANTABLE DEVICE N/A 03/01/2016   Procedure: ICD Generator Changeout;  Surgeon: [name], MD;  Location: [facility] INVASIVE CV LAB;  Service: Cardiovascular;  Laterality: N/A;  . ICD/BIV ICD FUNCTION(DFT) TEST N/A 03/14/2021   Procedure: ICD/BIV ICD FUNCTION (DFT) TEST;  Surgeon: [name], MD;  Location: [facility] INVASIVE CV LAB;  Service: Cardiovascular;  Laterality: N/A;  . L1 corpectomy   12/10  . Laryngeal polyp excision    . RIGHT/LEFT HEART CATH AND CORONARY ANGIOGRAPHY N/A 12/18/2020   Procedure: RIGHT/LEFT HEART CATH AND CORONARY ANGIOGRAPHY;  Surgeon: Dolores Patty, MD;  Location: [facility] INVASIVE CV LAB;  Service: Cardiovascular;  Laterality: N/A;  . RIGHT/LEFT HEART CATH AND CORONARY ANGIOGRAPHY N/A 03/05/2021   Procedure: RIGHT/LEFT HEART CATH AND CORONARY ANGIOGRAPHY;  Surgeon: [name], MD;  Location: [facility] INVASIVE CV LAB;  Service: Cardiovascular;  Laterality: N/A;   . TEE WITHOUT CARDIOVERSION N/A 12/01/2020   Procedure: TRANSESOPHAGEAL ECHOCARDIOGRAM (TEE);  Surgeon: [name], MD;  Location: [facility] ENDOSCOPY;  Service: Cardiovascular;  Laterality: N/A;  Floyce Stakes ABLATION N/A 03/08/2021   Procedure: Floyce Stakes ABLATION;  Surgeon: Lanier Prude, MD;  Location: [facility] INVASIVE CV LAB;  Service: Cardiovascular;  Laterality: N/A;     OB History   No obstetric history on file.     Family History  Problem Relation Age of Onset  . Heart disease Father 23  . Ankylosing spondylitis Sister   . Stomach cancer Brother 71  . Heart attack Brother     Social History   Tobacco Use  . Smoking status: Current Every Day Smoker    Packs/day: 1.00    Years: 43.00    Pack years: 43.00    Types: Cigarettes    Start date: 03/03/1978  . Smokeless tobacco: Never Used  . Tobacco comment: 4 ciggs per day  Vaping Use  . Vaping Use: Every day  Substance Use Topics  . Alcohol use: No    Alcohol/week: 0.0 standard drinks  . Drug use: No    Comment: Prior history of cocaine  Home Medications Prior to Admission medications   Medication Sig Start Date End Date Taking? Authorizing Provider  ALPRAZolam (XANAX) 0.25 MG tablet Take 1 tablet (0.25 mg total) by mouth at bedtime as needed for anxiety. 03/28/21 03/28/22  Laurey MoraleMcLean, Dalton S, MD  amiodarone (PACERONE) 200 MG tablet TAKE 2 TABLETS (400 MG TOTAL) BY MOUTH 2 (TWO) TIMES DAILY. 12/19/20 12/19/21  Allayne ButcherSimmons, Brittainy M, PA-C  amiodarone (PACERONE) 400 MG tablet Take 1 tablet (400 mg total) by mouth daily. 03/15/21   Sheilah PigeonUrsuy, Renee Lynn, PA-C  carvedilol (COREG) 6.25 MG tablet Take 1.5 tablets (9.375 mg total) by mouth 2 (two) times daily. 03/28/21   Laurey MoraleMcLean, Dalton S, MD  clopidogrel (PLAVIX) 75 MG tablet Take 1 tablet (75 mg total) by mouth daily with breakfast. 01/10/21   Robbie LisSimmons, Brittainy M, PA-C  digoxin (LANOXIN) 0.125 MG tablet TAKE 1/2 TABLET (0.0625 MG) BY MOUTH DAILY. 12/19/20 12/19/21  Robbie LisSimmons, Brittainy M, PA-C   DULoxetine (CYMBALTA) 60 MG capsule Take 60 mg by mouth every evening.    [provider]  empagliflozin (JARDIANCE) 10 MG TABS tablet Take 1 tablet (10 mg total) by mouth daily before breakfast. 01/03/21   Laurey MoraleMcLean, Dalton S, MD  enoxaparin (LOVENOX) 100 MG/ML injection INJECT 1 ML (100 MG TOTAL) INTO THE SKIN EVERY 12 (TWELVE) HOURS FOR 10 DAYS. 01/09/21 01/09/22  Robbie LisSimmons, Brittainy M, PA-C  enoxaparin (LOVENOX) 100 MG/ML injection INJECT 1 ML (100 MG TOTAL) INTO THE SKIN EVERY 12 (TWELVE) HOURS FOR 10 DAYS. 01/09/21 01/09/22  Laurey MoraleMcLean, Dalton S, MD  ezetimibe (ZETIA) 10 MG tablet Take 1 tablet (10 mg total) by mouth daily. 08/16/20 11/17/21  Sherryll BurgerShah, Pratik D, DO  fluticasone (FLONASE) 50 MCG/ACT nasal spray Place 1 spray into both nostrils daily as needed for allergies.  09/24/19   [provider]  isosorbide mononitrate (IMDUR) 30 MG 24 hr tablet Take 0.5 tablets (15 mg total) by mouth daily. 03/16/21   Sheilah PigeonUrsuy, Renee Lynn, PA-C  methocarbamol (ROBAXIN) 750 MG tablet Take 750 mg by mouth every 8 (eight) hours as needed for muscle spasms.    [provider]  mexiletine (MEXITIL) 200 MG capsule Take 1 capsule (200 mg total) by mouth every 8 (eight) hours. 03/15/21   Sheilah PigeonUrsuy, Renee Lynn, PA-C  Multiple Vitamins-Minerals (MULTIVITAMINS THER. W/MINERALS) TABS Take 1 tablet by mouth daily.      [provider]  nitroGLYCERIN (NITROSTAT) 0.4 MG SL tablet PLACE 1 TABLET UNDER THE TONGUE EVERY 5 MINUTES FOR 3 DOSES AS NEEDED CHEST PAIN 01/25/21   Jonelle SidleMcDowell, Samuel G, MD  oxyCODONE-acetaminophen (PERCOCET) 10-325 MG tablet Take 1 tablet by mouth every 4 (four) hours as needed for pain.    [provider]  pantoprazole (PROTONIX) 40 MG tablet Take 1 tablet (40 mg total) by mouth daily. 03/28/21 03/28/22  Laurey MoraleMcLean, Dalton S, MD  potassium chloride SA (KLOR-CON) 20 MEQ tablet Take 2 tablets (40 mEq total) by mouth daily. 02/12/21   Jonelle SidleMcDowell, Samuel G, MD  promethazine (PHENERGAN) 25 MG  tablet Take 25 mg by mouth every 6 (six) hours as needed for nausea. With pain medication    [provider]  sacubitril-valsartan (ENTRESTO) 24-26 MG Take 1 tablet by mouth 2 (two) times daily.    [provider]  spironolactone (ALDACTONE) 25 MG tablet Take 1 tablet (25 mg total) by mouth daily. 11/28/20   Laurey MoraleMcLean, Dalton S, MD  torsemide 60 MG TABS Take 60 mg by mouth daily. 03/16/21   Sheilah PigeonUrsuy, Renee Lynn, PA-C  warfarin (  COUMADIN) 10 MG tablet TAKE 1/2 TABLET BY MOUTH EVERY DAY EXCEPT 1 TABLET ON SUNDAYS AND THURSDAYS Patient taking differently: Take 5-10 mg by mouth See admin instructions. 5 mg Monday,Tuesday,Wednesday,Friday,Saturday. 10 mg Sunday and thursday 02/12/21   [name], MD  zolpidem (AMBIEN) 10 MG tablet Take 10 mg by mouth at bedtime as needed for sleep. 02/09/21   [provider]    Allergies    Beef-derived products, Metoclopramide hcl, Penicillins, Pork-derived products, Metoprolol, Reglan [metoclopramide], and Statins  Review of Systems   Review of Systems  Respiratory: Positive for shortness of breath.   All other systems reviewed and are negative.   Physical Exam Updated Vital Signs BP (!) 122/53   Pulse (!) 59   Temp 97.7 F (36.5 C) (Oral)   Resp 19   Ht 5\' 5"  (1.651 m)   Wt 88.9 kg   SpO2 100%   BMI 32.62 kg/m   Physical Exam Vitals and nursing note reviewed.  Constitutional:      General: She is not in acute distress.    Appearance: She is well-developed.  HENT:     Head: Normocephalic and atraumatic.  Eyes:     Pupils: Pupils are equal, round, and reactive to light.  Cardiovascular:     Rate and Rhythm: Normal rate and regular rhythm.     Heart sounds: Normal heart sounds. No murmur heard. No friction rub.  Pulmonary:     Effort: Pulmonary effort is normal.     Breath sounds: Normal breath sounds. No wheezing or rales.  Chest:     Comments: Defibrillator present in the left upper chest Abdominal:      General: Bowel sounds are normal. There is no distension.     Palpations: Abdomen is soft.     Tenderness: There is no abdominal tenderness. There is no guarding or rebound.  Musculoskeletal:        General: No tenderness. Normal range of motion.     Comments: No edema  Skin:    General: Skin is warm and dry.     Findings: No rash.  Neurological:     Mental Status: She is alert and oriented to person, place, and time.     Cranial Nerves: No cranial nerve deficit.  Psychiatric:        Behavior: Behavior normal.     ED Results / Procedures / Treatments   Labs (all labs ordered are listed, but only abnormal results are displayed) Labs Reviewed  BASIC METABOLIC PANEL - Abnormal; Notable for the following components:      Result Value   Sodium 134 (*)    All other components within normal limits  TROPONIN I (HIGH SENSITIVITY) - Abnormal; Notable for the following components:   Troponin I (High Sensitivity) 18 (*)    All other components within normal limits  CBC  TROPONIN I (HIGH SENSITIVITY)    EKG EKG Interpretation  Date/Time:  Wednesday April 04 2021 16:36:01 EDT Ventricular Rate:  68 PR Interval:  192 QRS Duration: 102 QT Interval:  420 QTC Calculation: 446 R Axis:   25 Text Interpretation: Normal sinus rhythm Minimal voltage criteria for LVH, may be normal variant ( Cornell product ) Septal infarct , age undetermined No significant change since last tracing Confirmed by 03-06-2003 (Gwyneth Sprout) on 04/04/2021 6:03:46 PM     Radiology No results found.  Procedures Procedures   Medications Ordered in ED Medications - No data to display  ED Course  I have  reviewed the triage vital signs and the nursing notes.  Pertinent labs & imaging results that were available during my care of the patient were reviewed by me and considered in my medical decision making (see chart for details).    MDM Rules/Calculators/A&P                          Patient presenting  today due to concern that she may have gone into ventricular tachycardia because she had an episode of some fluttering and lightheadedness today around 2:00.  She currently has no complaints at this time.  She has been taking all her medications as prescribed.  Last change in medication was approximately 2 weeks ago.  Patient has a Environmental manager and it was interrogated.  Last event was on April 2 at 11 PM and she had one episode of ventricular tachycardia that was atrial paced and the duration was 26 seconds which pt already knew about.  Prior to that her last episode was March 23.  She had no episodes today.  Labs are otherwise reassuring.  She otherwise has plans to go back and follow with the Coumadin clinic and doses already been adjusted.  Feel that patient is stable for discharge.  She was given return precautions. Final Clinical Impression(s) / ED Diagnoses Final diagnoses:  Palpitations    Rx / DC Orders ED Discharge Orders    None       Gwyneth Sprout, MD 04/04/21 Virgil Benedict, MD 04/04/21 1821

## 2021-04-05 NOTE — Telephone Encounter (Signed)
Pt returning nurse call

## 2021-04-05 NOTE — Telephone Encounter (Signed)
Per Joey from [employer], he would like for patient to bring her remote monitor box with her to [place] and I will see if I can assist her with the remote monitor. Patient called and updated and agrees to bring with her.

## 2021-04-05 NOTE — Telephone Encounter (Signed)
Patient reports she left the ED yesterday at 8:00 PM. States nothing showed on her device per ED. Patient states her remote monitor is not working. Advised patient I will order her a new one, it is on back order and will take months to get. Patients address on file verified. Patient agreeable with plan. Shock plan reviewed with verbal understanding.

## 2021-04-10 NOTE — Telephone Encounter (Signed)
She has a lot of VT. No change in treatment.

## 2021-04-12 ENCOUNTER — Ambulatory Visit (INDEPENDENT_AMBULATORY_CARE_PROVIDER_SITE_OTHER): Payer: Medicare PPO | Admitting: *Deleted

## 2021-04-12 ENCOUNTER — Other Ambulatory Visit: Payer: Self-pay

## 2021-04-12 DIAGNOSIS — I63549 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: Secondary | ICD-10-CM

## 2021-04-12 DIAGNOSIS — D6859 Other primary thrombophilia: Secondary | ICD-10-CM | POA: Diagnosis not present

## 2021-04-12 DIAGNOSIS — Z5181 Encounter for therapeutic drug level monitoring: Secondary | ICD-10-CM

## 2021-04-12 LAB — POCT INR: INR: 3.8 — AB (ref 2.0–3.0)

## 2021-04-12 NOTE — Patient Instructions (Signed)
On Amiodarone 400mg  twice daily Decrease warfarin to 1/2 tablet daily Recheck in 1 wk

## 2021-04-17 ENCOUNTER — Ambulatory Visit (INDEPENDENT_AMBULATORY_CARE_PROVIDER_SITE_OTHER): Payer: Medicare PPO | Admitting: Internal Medicine

## 2021-04-17 ENCOUNTER — Encounter: Payer: Self-pay | Admitting: Internal Medicine

## 2021-04-17 ENCOUNTER — Other Ambulatory Visit: Payer: Self-pay

## 2021-04-17 VITALS — BP 110/70 | HR 68 | Ht 65.0 in | Wt 197.0 lb

## 2021-04-17 DIAGNOSIS — I5022 Chronic systolic (congestive) heart failure: Secondary | ICD-10-CM

## 2021-04-17 DIAGNOSIS — I4819 Other persistent atrial fibrillation: Secondary | ICD-10-CM

## 2021-04-17 DIAGNOSIS — I472 Ventricular tachycardia, unspecified: Secondary | ICD-10-CM

## 2021-04-17 MED ORDER — AMIODARONE HCL 200 MG PO TABS: ORAL_TABLET | ORAL | 3 refills | Status: DC

## 2021-04-17 NOTE — Progress Notes (Signed)
HPI Ms. Bourget returns today for followup of VT and chronic systolic heart failure. She is a pleasant 60 yo woman with the above problems and an elevated DFT. She had a VT ablation and has continued to have VT but with successful ATP. She has not had syncope. She has had some symptoms while watching basketball. No edema. She has class 2 CHF symptoms.  Allergies  Allergen Reactions  . Beef-Derived Products Anaphylaxis    From tick bite  . Metoclopramide Hcl Anaphylaxis    Non responsive  . Penicillins Anaphylaxis    Pt states she can take any cephalosporin (per pt. 01/01/19) Shock Has patient had a PCN reaction causing immediate rash, facial/tongue/throat swelling, SOB or lightheadedness with hypotension: Yes Has patient had a PCN reaction causing severe rash involving mucus membranes or skin necrosis: No Has patient had a PCN reaction that required hospitalization Yes Has patient had a PCN reaction occurring within the last 10 years: No If all of the above answers are "NO", then may proceed with Cephalosporin   . Pork-Derived Products Anaphylaxis    From tick bite. Has tolerated heparin/lovenox  . Metoprolol Other (See Comments)    Syncope   . Reglan [Metoclopramide]   . Statins Other (See Comments)    Myalgias     Current Outpatient Medications  Medication Sig Dispense Refill  . ALPRAZolam (XANAX) 0.25 MG tablet Take 1 tablet (0.25 mg total) by mouth at bedtime as needed for anxiety. 30 tablet 2  . amiodarone (PACERONE) 200 MG tablet TAKE 2 TABLETS (400 MG TOTAL) BY MOUTH 2 (TWO) TIMES DAILY. 120 tablet 5  . carvedilol (COREG) 6.25 MG tablet Take 1.5 tablets (9.375 mg total) by mouth 2 (two) times daily. 90 tablet 11  . clopidogrel (PLAVIX) 75 MG tablet Take 1 tablet (75 mg total) by mouth daily with breakfast. 30 tablet 11  . DULoxetine (CYMBALTA) 60 MG capsule Take 60 mg by mouth every evening.    . empagliflozin (JARDIANCE) 10 MG TABS tablet Take 1 tablet (10 mg total)  by mouth daily before breakfast. 30 tablet 3  . ezetimibe (ZETIA) 10 MG tablet Take 1 tablet (10 mg total) by mouth daily. 30 tablet 3  . fluticasone (FLONASE) 50 MCG/ACT nasal spray Place 1 spray into both nostrils daily as needed for allergies.     . isosorbide mononitrate (IMDUR) 30 MG 24 hr tablet Take 0.5 tablets (15 mg total) by mouth daily. 15 tablet 6  . methocarbamol (ROBAXIN) 750 MG tablet Take 750 mg by mouth every 8 (eight) hours as needed for muscle spasms.    Marland Kitchen mexiletine (MEXITIL) 200 MG capsule Take 1 capsule (200 mg total) by mouth every 8 (eight) hours. 90 capsule 7  . Multiple Vitamins-Minerals (MULTIVITAMINS THER. W/MINERALS) TABS Take 1 tablet by mouth daily.      . nitroGLYCERIN (NITROSTAT) 0.4 MG SL tablet PLACE 1 TABLET UNDER THE TONGUE EVERY 5 MINUTES FOR 3 DOSES AS NEEDED CHEST PAIN 25 tablet 3  . oxyCODONE-acetaminophen (PERCOCET) 10-325 MG tablet Take 1 tablet by mouth every 4 (four) hours as needed for pain.    . pantoprazole (PROTONIX) 40 MG tablet Take 1 tablet (40 mg total) by mouth daily. 30 tablet 11  . potassium chloride SA (KLOR-CON) 20 MEQ tablet Take 2 tablets (40 mEq total) by mouth daily. 180 tablet 1  . promethazine (PHENERGAN) 25 MG tablet Take 25 mg by mouth every 6 (six) hours as needed for nausea. With pain  medication    . sacubitril-valsartan (ENTRESTO) 24-26 MG Take 1 tablet by mouth 2 (two) times daily.    Marland Kitchen spironolactone (ALDACTONE) 25 MG tablet Take 1 tablet (25 mg total) by mouth daily. 30 tablet 3  . torsemide 60 MG TABS Take 60 mg by mouth daily. 30 tablet 6  . warfarin (COUMADIN) 10 MG tablet TAKE 1/2 TABLET BY MOUTH EVERY DAY EXCEPT 1 TABLET ON SUNDAYS AND THURSDAYS (Patient taking differently: Take 5-10 mg by mouth See admin instructions. 5 mg Monday,Tuesday,Wednesday,Friday,Saturday. 10 mg Sunday and thursday) 30 tablet 3  . zolpidem (AMBIEN) 10 MG tablet Take 10 mg by mouth at bedtime as needed for sleep.    Marland Kitchen digoxin (LANOXIN) 0.125 MG  tablet TAKE 1/2 TABLET (0.0625 MG) BY MOUTH DAILY. (Patient not taking: Reported on 04/17/2021) 15 tablet 5   No current facility-administered medications for this visit.     Past Medical History:  Diagnosis Date  . Aneurysm of internal carotid artery   . Anticardiolipin antibody positive    Chronic Coumadin  . Brain aneurysm    followed by Dr. Corliss Skains  . Chronic combined systolic (congestive) and diastolic (congestive) heart failure (HCC)   . Cocaine abuse in remission (HCC)   . Coronary atherosclerosis of native coronary artery    Previous invasive cardiac testing was done at a facility in [place], [place].  Occluded diagonal, Diffuse LAD disease, Occluded Cx, and non obstructive RCA.   . Essential hypertension   . Headache   . History of pneumonia   . ICD (implantable cardioverter-defibrillator) in place   . Ischemic cardiomyopathy    LVEF 30-35%  . Mixed hyperlipidemia   . NSVT (nonsustained ventricular tachycardia) (HCC)   . Persistent atrial fibrillation (HCC)   . PVD (peripheral vascular disease) (HCC)   . Raynaud's phenomenon   . Statin intolerance   . Stroke [facility]) 2007   Total occlusion of the right internal carotid    ROS:   All systems reviewed and negative except as noted in the HPI.   Past Surgical History:  Procedure Laterality Date  . ABDOMINAL AORTAGRAM N/A 05/25/2014   Procedure: ABDOMINAL Ronny Flurry;  Surgeon: Iran Ouch, MD;  Location: [facility] CATH LAB;  Service: Cardiovascular;  Laterality: N/A;  . APPENDECTOMY    . CARDIAC DEFIBRILLATOR PLACEMENT     [employer] ICD  . CARDIOVERSION N/A 12/01/2020   Procedure: CARDIOVERSION;  Surgeon: [name], MD;  Location: [facility] ENDOSCOPY;  Service: Cardiovascular;  Laterality: N/A;  . CORONARY STENT INTERVENTION N/A 01/08/2021   Procedure: CORONARY STENT INTERVENTION;  Surgeon: Swaziland, Peter M, MD;  Location: [facility] INVASIVE CV LAB;  Service: Cardiovascular;  Laterality: N/A;  . EP IMPLANTABLE DEVICE N/A 03/01/2016    Procedure: ICD Generator Changeout;  Surgeon: [name], MD;  Location: [facility] INVASIVE CV LAB;  Service: Cardiovascular;  Laterality: N/A;  . ICD/BIV ICD FUNCTION(DFT) TEST N/A 03/14/2021   Procedure: ICD/BIV ICD FUNCTION (DFT) TEST;  Surgeon: [name], MD;  Location: [facility] INVASIVE CV LAB;  Service: Cardiovascular;  Laterality: N/A;  . L1 corpectomy   12/10  . Laryngeal polyp excision    . RIGHT/LEFT HEART CATH AND CORONARY ANGIOGRAPHY N/A 12/18/2020   Procedure: RIGHT/LEFT HEART CATH AND CORONARY ANGIOGRAPHY;  Surgeon: Dolores Patty, MD;  Location: [facility] INVASIVE CV LAB;  Service: Cardiovascular;  Laterality: N/A;  . RIGHT/LEFT HEART CATH AND CORONARY ANGIOGRAPHY N/A 03/05/2021   Procedure: RIGHT/LEFT HEART CATH AND CORONARY ANGIOGRAPHY;  Surgeon: [name], MD;  Location: [facility] INVASIVE CV LAB;  Service: Cardiovascular;  Laterality: N/A;  . TEE WITHOUT CARDIOVERSION N/A 12/01/2020   Procedure: TRANSESOPHAGEAL ECHOCARDIOGRAM (TEE);  Surgeon: [name], MD;  Location: [facility] ENDOSCOPY;  Service: Cardiovascular;  Laterality: N/A;  Floyce Stakes ABLATION N/A 03/08/2021   Procedure: Floyce Stakes ABLATION;  Surgeon: Lanier Prude, MD;  Location: [facility] INVASIVE CV LAB;  Service: Cardiovascular;  Laterality: N/A;     Family History  Problem Relation Age of Onset  . Heart disease Father 89  . Ankylosing spondylitis Sister   . Stomach cancer Brother 11  . Heart attack Brother      Social History   Socioeconomic History  . Marital status: Single    Spouse name: Not on file  . Number of children: Not on file  . Years of education: Not on file  . Highest education level: Not on file  Occupational History  . Occupation: Disabled    Comment: ESL  Tobacco Use  . Smoking status: Former Smoker    Packs/day: 1.00    Years: 43.00    Pack years: 43.00    Types: Cigarettes, E-cigarettes    Start date: 03/03/1978  . Smokeless tobacco: Never Used  . Tobacco comment: 4 ciggs per day  Vaping  Use  . Vaping Use: Every day  Substance and Sexual Activity  . Alcohol use: No    Alcohol/week: 0.0 standard drinks  . Drug use: No    Comment: Prior history of cocaine  . Sexual activity: Yes    Birth control/protection: None  Other Topics Concern  . Not on file  Social History Narrative  . Not on file   Social Determinants of Health   Financial Resource Strain: Low Risk   . Difficulty of Paying Living Expenses: Not very hard  Food Insecurity: No Food Insecurity  . Worried About Programme researcher, broadcasting/film/video in the Last Year: Never true  . Ran Out of Food in the Last Year: Never true  Transportation Needs: No Transportation Needs  . Lack of Transportation (Medical): No  . Lack of Transportation (Non-Medical): No  Physical Activity: Not on file  Stress: Not on file  Social Connections: Not on file  Intimate Partner Violence: Not on file     BP 110/70   Pulse 68   Ht 5\' 5"  (1.651 m)   Wt 197 lb (89.4 kg)   BMI 32.78 kg/m   Physical Exam:  Well appearing NAD HEENT: Unremarkable Neck:  No JVD, no thyromegally Lymphatics:  No adenopathy Back:  No CVA tenderness Lungs:  Clear with no wheezes HEART:  Regular rate rhythm, no murmurs, no rubs, no clicks Abd:  soft, positive bowel sounds, no organomegally, no rebound, no guarding Ext:  2 plus pulses, no edema, no cyanosis, no clubbing Skin:  No rashes no nodules Neuro:  CN II through XII intact, motor grossly intact  DEVICE  Normal device function.  See PaceArt for details.   Assess/Plan: 1. VT - she has mostly had her symptoms controlled. She has a couple of successful ATP's. I have asked her to reduce her dose of amio to 400 mg daily, mon-fri and 200 mg daily S/S. Continue mexitil. 2. Chronic systolic heart failure - her symptoms are class 2. We will follow. 3. ICD - her [place] device is working normally. We will recheck in several months. 4. dyslipidemia - she will continue her statin therapy.   Lennard Capek,MD

## 2021-04-17 NOTE — Patient Instructions (Addendum)
Medication Instructions:  Take Amiodarone 400 mg (2 tablets) daily Monday,Tuesday,Wednesday, Thursday and Friday  On Saturday and Sunday, take 200 mg (1 tablet)   *If you need a refill on your cardiac medications before your next appointment, please call your pharmacy*   Lab Work: None today If you have labs (blood work) drawn today and your tests are completely normal, you will receive your results only by: Marland Kitchen MyChart Message (if you have MyChart) OR . A paper copy in the mail If you have any lab test that is abnormal or we need to change your treatment, we will call you to review the results.   Testing/Procedures: None today   Follow-Up: At [facility], you and your health needs are our priority.  As part of our continuing mission to provide you with exceptional heart care, we have created designated Provider Care Teams.  These Care Teams include your primary Cardiologist (physician) and Advanced Practice Providers (APPs -  Physician Assistants and Nurse Practitioners) who all work together to provide you with the care you need, when you need it.  We recommend signing up for the patient portal called "MyChart".  Sign up information is provided on this After Visit Summary.  MyChart is used to connect with patients for Virtual Visits (Telemedicine).  Patients are able to view lab/test results, encounter notes, upcoming appointments, etc.  Non-urgent messages can be sent to your provider as well.   To learn more about what you can do with MyChart, go to [email or web address].    Your next appointment:   6 month(s)  The format for your next appointment:   In Person  Provider:   Lewayne Bunting, MD   Other Instructions None

## 2021-04-19 ENCOUNTER — Other Ambulatory Visit: Payer: Self-pay

## 2021-04-19 ENCOUNTER — Ambulatory Visit (INDEPENDENT_AMBULATORY_CARE_PROVIDER_SITE_OTHER): Payer: Medicare PPO | Admitting: *Deleted

## 2021-04-19 DIAGNOSIS — I63549 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: Secondary | ICD-10-CM

## 2021-04-19 DIAGNOSIS — Z5181 Encounter for therapeutic drug level monitoring: Secondary | ICD-10-CM

## 2021-04-19 DIAGNOSIS — D6859 Other primary thrombophilia: Secondary | ICD-10-CM | POA: Diagnosis not present

## 2021-04-19 LAB — POCT INR: INR: 4 — AB (ref 2.0–3.0)

## 2021-04-19 NOTE — Patient Instructions (Signed)
Decreasing Amiodarone 400mg  twice daily except 200mg  on Sat/Sun starting 4/23 Hold warfarin tonight then decrease dose to 1/2 tablet daily except none on Sundays Recheck in 1 wk

## 2021-04-25 ENCOUNTER — Encounter (HOSPITAL_COMMUNITY): Payer: Self-pay

## 2021-04-30 ENCOUNTER — Ambulatory Visit (INDEPENDENT_AMBULATORY_CARE_PROVIDER_SITE_OTHER): Payer: Medicare PPO | Admitting: *Deleted

## 2021-04-30 ENCOUNTER — Other Ambulatory Visit: Payer: Self-pay

## 2021-04-30 DIAGNOSIS — Z5181 Encounter for therapeutic drug level monitoring: Secondary | ICD-10-CM

## 2021-04-30 DIAGNOSIS — D6859 Other primary thrombophilia: Secondary | ICD-10-CM

## 2021-04-30 DIAGNOSIS — I63549 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: Secondary | ICD-10-CM | POA: Diagnosis not present

## 2021-04-30 DIAGNOSIS — D6861 Antiphospholipid syndrome: Secondary | ICD-10-CM

## 2021-04-30 LAB — POCT INR: INR: 1.3 — AB (ref 2.0–3.0)

## 2021-04-30 NOTE — Patient Instructions (Signed)
Decreased Amiodarone 400mg  twice daily except 200mg  on Sat/Sun starting 4/23 Take warfarin 1 tablet tonight and tomorrow night then increase dose to 1/2 tablet daily Recheck in 1 wk

## 2021-05-05 ENCOUNTER — Other Ambulatory Visit (HOSPITAL_COMMUNITY): Payer: Self-pay | Admitting: Cardiology

## 2021-05-07 ENCOUNTER — Ambulatory Visit (INDEPENDENT_AMBULATORY_CARE_PROVIDER_SITE_OTHER): Payer: Medicare PPO | Admitting: *Deleted

## 2021-05-07 ENCOUNTER — Other Ambulatory Visit (HOSPITAL_COMMUNITY): Payer: Self-pay | Admitting: *Deleted

## 2021-05-07 ENCOUNTER — Encounter (HOSPITAL_COMMUNITY): Payer: Self-pay

## 2021-05-07 DIAGNOSIS — D6859 Other primary thrombophilia: Secondary | ICD-10-CM | POA: Diagnosis not present

## 2021-05-07 DIAGNOSIS — D6861 Antiphospholipid syndrome: Secondary | ICD-10-CM

## 2021-05-07 DIAGNOSIS — Z5181 Encounter for therapeutic drug level monitoring: Secondary | ICD-10-CM

## 2021-05-07 DIAGNOSIS — I63549 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: Secondary | ICD-10-CM | POA: Diagnosis not present

## 2021-05-07 LAB — POCT INR: INR: 2 (ref 2.0–3.0)

## 2021-05-07 MED ORDER — MEXILETINE HCL 200 MG PO CAPS: 200.0000 mg | ORAL_CAPSULE | Freq: Three times a day (TID) | ORAL | 3 refills | Status: DC

## 2021-05-07 MED ORDER — MEXILETINE HCL 200 MG PO CAPS: 200.0000 mg | ORAL_CAPSULE | Freq: Three times a day (TID) | ORAL | 7 refills | Status: DC

## 2021-05-07 NOTE — Patient Instructions (Signed)
Decreased Amiodarone 400mg  twice daily except 200mg  on Sat/Sun starting 4/23 Continue warfarin 1/2 tablet daily Recheck in 2 wks

## 2021-05-11 ENCOUNTER — Encounter (HOSPITAL_COMMUNITY): Payer: Self-pay | Admitting: Cardiology

## 2021-05-11 ENCOUNTER — Ambulatory Visit (HOSPITAL_COMMUNITY)
Admission: RE | Admit: 2021-05-11 | Discharge: 2021-05-11 | Disposition: A | Discharge status: Home / Self Care | Payer: Medicare PPO | Source: Ambulatory Visit | Attending: Cardiology | Admitting: Cardiology

## 2021-05-11 ENCOUNTER — Other Ambulatory Visit: Payer: Self-pay

## 2021-05-11 VITALS — BP 104/70 | HR 76 | Wt 198.4 lb

## 2021-05-11 DIAGNOSIS — I5022 Chronic systolic (congestive) heart failure: Secondary | ICD-10-CM | POA: Diagnosis not present

## 2021-05-11 DIAGNOSIS — D6861 Antiphospholipid syndrome: Secondary | ICD-10-CM | POA: Insufficient documentation

## 2021-05-11 DIAGNOSIS — I472 Ventricular tachycardia, unspecified: Secondary | ICD-10-CM

## 2021-05-11 DIAGNOSIS — I5042 Chronic combined systolic (congestive) and diastolic (congestive) heart failure: Secondary | ICD-10-CM

## 2021-05-11 DIAGNOSIS — F17210 Nicotine dependence, cigarettes, uncomplicated: Secondary | ICD-10-CM | POA: Diagnosis not present

## 2021-05-11 DIAGNOSIS — I4819 Other persistent atrial fibrillation: Secondary | ICD-10-CM | POA: Diagnosis not present

## 2021-05-11 DIAGNOSIS — I252 Old myocardial infarction: Secondary | ICD-10-CM | POA: Diagnosis not present

## 2021-05-11 DIAGNOSIS — I2510 Atherosclerotic heart disease of native coronary artery without angina pectoris: Secondary | ICD-10-CM | POA: Diagnosis not present

## 2021-05-11 DIAGNOSIS — Z7901 Long term (current) use of anticoagulants: Secondary | ICD-10-CM | POA: Diagnosis not present

## 2021-05-11 DIAGNOSIS — D696 Thrombocytopenia, unspecified: Secondary | ICD-10-CM | POA: Insufficient documentation

## 2021-05-11 DIAGNOSIS — Z7902 Long term (current) use of antithrombotics/antiplatelets: Secondary | ICD-10-CM | POA: Insufficient documentation

## 2021-05-11 DIAGNOSIS — Z7984 Long term (current) use of oral hypoglycemic drugs: Secondary | ICD-10-CM | POA: Diagnosis not present

## 2021-05-11 DIAGNOSIS — Z955 Presence of coronary angioplasty implant and graft: Secondary | ICD-10-CM | POA: Diagnosis not present

## 2021-05-11 DIAGNOSIS — Z8673 Personal history of transient ischemic attack (TIA), and cerebral infarction without residual deficits: Secondary | ICD-10-CM | POA: Diagnosis not present

## 2021-05-11 DIAGNOSIS — I110 Hypertensive heart disease with heart failure: Secondary | ICD-10-CM | POA: Insufficient documentation

## 2021-05-11 DIAGNOSIS — I255 Ischemic cardiomyopathy: Secondary | ICD-10-CM | POA: Insufficient documentation

## 2021-05-11 DIAGNOSIS — Z79899 Other long term (current) drug therapy: Secondary | ICD-10-CM | POA: Insufficient documentation

## 2021-05-11 DIAGNOSIS — Z8249 Family history of ischemic heart disease and other diseases of the circulatory system: Secondary | ICD-10-CM | POA: Diagnosis not present

## 2021-05-11 LAB — COMPREHENSIVE METABOLIC PANEL
ALT: 28 U/L (ref 0–44)
AST: 26 U/L (ref 15–41)
Albumin: 3.6 g/dL (ref 3.5–5.0)
Alkaline Phosphatase: 89 U/L (ref 38–126)
Anion gap: 10 (ref 5–15)
BUN: 13 mg/dL (ref 6–20)
CO2: 31 mmol/L (ref 22–32)
Calcium: 8.6 mg/dL — ABNORMAL LOW (ref 8.9–10.3)
Chloride: 95 mmol/L — ABNORMAL LOW (ref 98–111)
Creatinine, Ser: 1.03 mg/dL — ABNORMAL HIGH (ref 0.44–1.00)
GFR, Estimated: >60 mL/min (ref >60)
Glucose, Bld: 105 mg/dL — ABNORMAL HIGH (ref 70–99)
Potassium: 4.1 mmol/L (ref 3.5–5.1)
Sodium: 136 mmol/L (ref 135–145)
Total Bilirubin: 0.8 mg/dL (ref 0.3–1.2)
Total Protein: 7 g/dL (ref 6.5–8.1)

## 2021-05-11 LAB — TSH: TSH: 2.194 u[IU]/mL (ref 0.350–4.500)

## 2021-05-11 MED ORDER — CARVEDILOL 12.5 MG PO TABS: 12.5000 mg | ORAL_TABLET | Freq: Two times a day (BID) | ORAL | 6 refills | Status: DC

## 2021-05-11 NOTE — Patient Instructions (Signed)
Increase Carvedilol to 12.5 mg Twice daily   Labs done today, your results will be available in MyChart, we will contact you for abnormal readings.  Please see an Optometrist every 6 months  Your physician recommends that you schedule a follow-up appointment in: 3 months  If you have any questions or concerns before your next appointment please send [name] [name] a message through [place] or call our office at [phone].    TO LEAVE A MESSAGE FOR THE NURSE SELECT OPTION 2, PLEASE LEAVE A MESSAGE INCLUDING: . YOUR NAME . DATE OF BIRTH . CALL BACK NUMBER . REASON FOR CALL**this is important as we prioritize the call backs  YOU WILL RECEIVE A CALL BACK THE SAME DAY AS LONG AS YOU CALL BEFORE 4:00 PM  At the [facility], you and your health needs are our priority. As part of our continuing mission to provide you with exceptional heart care, we have created designated Provider Care Teams. These Care Teams include your primary Cardiologist (physician) and Advanced Practice Providers (APPs- Physician Assistants and Nurse Practitioners) who all work together to provide you with the care you need, when you need it.   You may see any of the following providers on your designated Care Team at your next follow up: Marland Kitchen Dr [name] . Dr [name] . Dr Thornell Mule . Tonye Becket, NP . Robbie Lis, PA . Shanda Bumps [place],NP . Karle Plumber, PharmD   Please be sure to bring in all your medications bottles to every appointment.

## 2021-05-13 NOTE — Progress Notes (Signed)
PCP: Ignatius Specking, MD Cardiology: Dr. Diona Browner HF Cardiology: Dr. Shirlee Latch  60 y.o. with history of CVA, anti-phospholipid antibody syndrome, CAD, ischemic cardiomyopathy, and persistent atrial fibrillation was referred by Dr. Diona Browner for evaluation of CHF. Patient had a cath in 2008 in setting of an MI in [place].  Diagonal and LCx were totally occluded, there was diffuse LAD disease.  She has not had a cath since that time. She has a Environmental manager ICD.  Echo in 8/21 showed EF 15-20%, anteroseptal akinesis, normal RV, severe LAE, mild MR. She was admitted in 8/21 with CHF exacerbation for 6 days.  She went into atrial fibrillation in 11/21.   In 12/21, she had TEE-guided DCCV back to NSR.  TEE showed EF 25-30%, mildly decreased RV systolic function, moderate MR.    Later in 12/21, she was admitted with syncope and VT with ICD shock.  RHC/LHC showed chronically occluded D1 and 95% proximal LCx; normal filling pressures with CI 3.0. Platelets were low, no intervention was done. Amiodarone was started.   In 1/22, she was admitted again with VT and presyncope, HS-TnI was negative.  She had PCI to LCx this admission.  She was started on mexiletine in addition to amiodarone.   She was readmitted later in 1/22 with chest pain, HS-TnI was negative and she was discharged.   She was admitted again in 3/22 with recurrent VT.  LHC/RHC showed totally occluded D1 (chronic) with patent LCx stent and filling pressures were normal with normal CO.  She had a VT ablation with Dr. Elberta Fortis.   She returns for followup of CHF and CAD.  Weight is down 3 lbs.  No palpitations.  She did have a run of VT treated with ATP on 04/04/21. She is vacuuming and doing housework without dyspnea.  No dyspnea walking on flat ground.  No chest pain.  Short of breath only with heavy exertion.  Wants to do some swimming this summer for exercise.  No orthopnea/PND.  Still smoking, about 1 pack every 4 days.    ECG (personally reviewed): NSR,  iLBBB  Labs (8/21): K 4.4, creatinine 0.58, LDL 94  Labs (12/21): LDL 72, HDL 31, TGs 85, K 4.4, creatinine 0.8, hgb 14.6, plts 70K Labs (1/22): K 3.3, creatinine 0.94, plts 58K Labs (3/22): K 3.8, creatinine 0.99, hgb 11.9, plts 160 Labs (4/22): hgb 14.2, plts 152, digoxin 0.2, K 4.1, creatinine 1.0  PMH: 1. Brain aneurysm: Followed by Dr. Corliss Skains.  2. HTN 3. CVA: Total occlusion of RICA.  - Carotid dopplers (12/21): Occluded RICA, < 50% stenosis LICA.  - Carotid dopplers (3/22): Occluded RICA, 1-39% LICA 4. Anti-phospholipid antibody syndrome: Diagnosed at [facility].  - On warfarin and ASA 81 given history of CVA.  5. Remote cocaine abuse.  6. PAD: peripheral arterial dopplers in 3/21 with early left-sided iliac disease, right anterior tibial disease; ABI 1.0 on right and 0.75 on left.  7. Active smoker.  8. L-spine arthritis.  9. CAD: MI in 2008 in [facility], cath at the time showed totally occluded diagonal, totally occluded LCX, and diffuse LAD disease.  No intervention.   - LHC (12/21): chronically occluded D1, 45% mid LAD, 95% proximal LCx.  - PCI to proximal LCx in 1/22.  - LHC (3/22): CTO D1, patent LCx stent.  10. Atrial fibrillation: Persistent, started in 11/21.  - TEE-DCCV back to NSR in 12/21.  11. Chronic systolic CHF: Ischemic cardiomyopathy.  [employer] ICD.  - Echo (8/21): EF 15-20%, anteroseptal  AK, RV normal, severe LAE, mild MR.  - TEE (12/21): EF 25-30%, mildly decreased RV systolic function, moderate MR with ERO 0.26 cm^2 (probably functional).   - RHC (12/21): mean RA 1, PA 24/6, mean PCWP 12, CI 3.0 - RHC (3/22): mean RA 1, PA 19/4, mean PCWP 7, CI 2.69 12. Hyperlipidemia: Statin intolerant.  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   14. Mitral regurgitation: Moderate on 12/21 TEE.  15. Ventricular tachycardia  FH: Father, grandfather with MIs.  Brother with MI in his 67s.   SH: Lives in [place], on disability, smokes 4 cigs/day.  Prior cocaine.  No  ETOH.   ROS: All systems reviewed and negative except as per HPI.   Current Outpatient Medications  Medication Sig Dispense Refill  . ALPRAZolam (XANAX) 0.25 MG tablet Take 1 tablet (0.25 mg total) by mouth at bedtime as needed for anxiety. 30 tablet 2  . amiodarone (PACERONE) 200 MG tablet Take 400 mg (2 tablets) Monday thru Friday, On Saturday and Sunday take 200 mg (1 tablet) 145 tablet 3  . clopidogrel (PLAVIX) 75 MG tablet Take 1 tablet (75 mg total) by mouth daily with breakfast. 30 tablet 11  . DULoxetine (CYMBALTA) 60 MG capsule Take 60 mg by mouth every evening.    . empagliflozin (JARDIANCE) 10 MG TABS tablet Take 1 tablet (10 mg total) by mouth daily before breakfast. 30 tablet 3  . ezetimibe (ZETIA) 10 MG tablet Take 1 tablet (10 mg total) by mouth daily. 30 tablet 3  . fluticasone (FLONASE) 50 MCG/ACT nasal spray Place 1 spray into both nostrils daily as needed for allergies.     . isosorbide mononitrate (IMDUR) 30 MG 24 hr tablet Take 0.5 tablets (15 mg total) by mouth daily. 15 tablet 6  . methocarbamol (ROBAXIN) 750 MG tablet Take 750 mg by mouth every 8 (eight) hours as needed for muscle spasms.    Marland Kitchen mexiletine (MEXITIL) 200 MG capsule Take 1 capsule (200 mg total) by mouth every 8 (eight) hours. 90 capsule 3  . Multiple Vitamins-Minerals (MULTIVITAMINS THER. W/MINERALS) TABS Take 1 tablet by mouth daily.      . nitroGLYCERIN (NITROSTAT) 0.4 MG SL tablet PLACE 1 TABLET UNDER THE TONGUE EVERY 5 MINUTES FOR 3 DOSES AS NEEDED CHEST PAIN 25 tablet 3  . oxyCODONE-acetaminophen (PERCOCET) 10-325 MG tablet Take 1 tablet by mouth every 4 (four) hours as needed for pain.    . pantoprazole (PROTONIX) 40 MG tablet Take 1 tablet (40 mg total) by mouth daily. 30 tablet 11  . potassium chloride SA (KLOR-CON) 20 MEQ tablet Take 2 tablets (40 mEq total) by mouth daily. 180 tablet 1  . promethazine (PHENERGAN) 25 MG tablet Take 25 mg by mouth every 6 (six) hours as needed for nausea. With pain  medication    . sacubitril-valsartan (ENTRESTO) 24-26 MG Take 1 tablet by mouth 2 (two) times daily.    Marland Kitchen spironolactone (ALDACTONE) 25 MG tablet Take 1 tablet (25 mg total) by mouth daily. 30 tablet 3  . torsemide 60 MG TABS Take 60 mg by mouth daily. 30 tablet 6  . traZODone (DESYREL) 100 MG tablet Take 100 mg by mouth at bedtime as needed for sleep.    Marland Kitchen warfarin (COUMADIN) 10 MG tablet TAKE 1/2 TABLET BY MOUTH EVERY DAY EXCEPT 1 TABLET ON SUNDAYS AND THURSDAYS (Patient taking differently: Take 5-10 mg by mouth See admin instructions. 5 mg Monday,Tuesday,Wednesday,Friday,Saturday. 10 mg Sunday and thursday) 30 tablet 3  . carvedilol (COREG) 12.5  MG tablet Take 1 tablet (12.5 mg total) by mouth 2 (two) times daily. 60 tablet 6   No current facility-administered medications for this encounter.   BP 104/70   Pulse 76   Wt 90 kg (198 lb 6.4 oz)   SpO2 96%   BMI 33.02 kg/m  General: NAD Neck: No JVD, no thyromegaly or thyroid nodule.  Lungs: Clear to auscultation bilaterally with normal respiratory effort. CV: Nondisplaced PMI.  Heart regular S1/S2, no S3/S4, no murmur.  No peripheral edema.  No carotid bruit.  Normal pedal pulses.  Abdomen: Soft, nontender, no hepatosplenomegaly, no distention.  Skin: Intact without lesions or rashes.  Neurologic: Alert and oriented x 3.  Psych: Normal affect. Extremities: No clubbing or cyanosis.  HEENT: Normal.   Assessment/Plan: 1. CAD: MI in 2008 in [facility], cath at the time showed totally occluded diagonal, totally occluded LCX, and diffuse LAD disease.  No intervention. LHC was done in 12/21 due to VT, this showed chronically occluded D1 and 95% proximal LCx.  Initially, no PCI due to thrombocytopenia.  She eventually had PCI to proximal LCx in 1/22.  Repeat cath in 3/22 showed stable CTO D1 and patent LCx stent.  - Continue Zetia, has not tolerated statins.  Good lipids in 12/21.  - Continue Plavix 75 mg daily x at least 6 months and ideally 1 year  post-PCI. - She is off ASA given use of warfarin and Plavix.  2. Atrial fibrillation: TEE-guided DCCV to NSR in 12/21.  She is in NSR today.   - If atrial fibrillation recurs, will need to consider Tikosyn versus ablation.  - Continue warfarin.   3. Carotid stenosis: Known RICA occlusion.   - Repeat dopplers in 3/23.  4. 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  - She is on warfarin.  5. Chronic systolic CHF: Ischemic cardiomyopathy.  [employer] ICD.  Echo (8/21) with EF 15-20%, anteroseptal AK, RV normal, severe LAE, mild MR.  TEE (12/21) with EF 25-30%.  RHC in 12/21 and again in 3/22 with preserved cardiac output and optimized filling pressures.  NYHA class II symptoms, not volume overloaded on exam.   - Continue Entresto 24/26 bid.  - Continue empagliflozin 10 mg daily.   - Continue spironolactone 25 mg daily.  - Increase Coreg to 12.5 mg bid, last episode of VT was in 4/22.  - Continue torsemide 60 mg daily, check BMET today.  - Continue digoxin 0.0625 daily, check digoxin level.  6. Smoking: Active smoker, wants to stop on her own.  7. PAD: She denies claudication.  - Needs to quit smoking.  8. 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  Platelets lately have been back in normal range.  - Has seen hematology.  9. Mitral regurgitation: Moderate on 12/21 TEE, suspect functional.  10. VT: Suspect scar-mediated in 12/21 with ICD discharge and syncope. She had recurrent VT in 3/22.  She had VT ablation in 3/22.  Since she has been home, she has had VT terminated by ATP (no shocks).  She is now on amiodarone + mexiletine.   - No driving x 6 months.  - Continue amiodarone, check LFTs and TSH today.  She will need a regular eye exam.   - Continue mexiletine.  - Increase Coreg to 12.5 mg bid. Continue to titrate up if BP tolerates.  - She has followup with Dr. Ladona Ridgel.   Followup in 3 months  [name] 05/13/2021

## 2021-05-15 ENCOUNTER — Encounter (HOSPITAL_COMMUNITY): Payer: Self-pay

## 2021-05-17 ENCOUNTER — Other Ambulatory Visit (HOSPITAL_COMMUNITY): Payer: Self-pay | Admitting: Cardiology

## 2021-05-17 ENCOUNTER — Ambulatory Visit (INDEPENDENT_AMBULATORY_CARE_PROVIDER_SITE_OTHER): Payer: Medicare PPO | Admitting: *Deleted

## 2021-05-17 ENCOUNTER — Encounter: Payer: Self-pay | Admitting: Cardiology

## 2021-05-17 ENCOUNTER — Ambulatory Visit (INDEPENDENT_AMBULATORY_CARE_PROVIDER_SITE_OTHER): Payer: Medicare PPO | Admitting: Cardiology

## 2021-05-17 VITALS — BP 122/68 | HR 61 | Ht 66.0 in | Wt 200.0 lb

## 2021-05-17 DIAGNOSIS — I255 Ischemic cardiomyopathy: Secondary | ICD-10-CM | POA: Diagnosis not present

## 2021-05-17 DIAGNOSIS — I472 Ventricular tachycardia: Secondary | ICD-10-CM

## 2021-05-17 DIAGNOSIS — Z5181 Encounter for therapeutic drug level monitoring: Secondary | ICD-10-CM | POA: Diagnosis not present

## 2021-05-17 DIAGNOSIS — I480 Paroxysmal atrial fibrillation: Secondary | ICD-10-CM

## 2021-05-17 DIAGNOSIS — I63549 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: Secondary | ICD-10-CM | POA: Diagnosis not present

## 2021-05-17 DIAGNOSIS — D6859 Other primary thrombophilia: Secondary | ICD-10-CM | POA: Diagnosis not present

## 2021-05-17 DIAGNOSIS — I4729 Other ventricular tachycardia: Secondary | ICD-10-CM

## 2021-05-17 LAB — POCT INR: INR: 3.7 — AB (ref 2.0–3.0)

## 2021-05-17 NOTE — Patient Instructions (Signed)
Decreased Amiodarone 400mg  twice daily except 200mg  on Sat/Sun starting 4/23 Decrease warfarin to 1/2 tablet daily except none on Thursdays Recheck in 2 wks

## 2021-05-17 NOTE — Patient Instructions (Addendum)
Medication Instructions:   Your physician recommends that you continue on your current medications as directed. Please refer to the Current Medication list given to you today.  Labwork:  none  Testing/Procedures:  none  Follow-Up:  Your physician recommends that you schedule a follow-up appointment in: 6 months. You will receive a reminder call or letter in the mail in about 4 months reminding you to call and schedule your appointment. If you don't receive this letter, please contact our office.  Any Other Special Instructions Will Be Listed Below (If Applicable).  If you need a refill on your cardiac medications before your next appointment, please call your pharmacy. 

## 2021-05-17 NOTE — Progress Notes (Signed)
Cardiology Office Note  Date: 05/17/2021   ID: [name] [name], DOB 11-Sep-1961, MRN [id number]  PCP:  Ignatius Specking, MD  Cardiologist:  Nona Dell, MD Electrophysiologist:  Lewayne Bunting, MD   Chief Complaint  Patient presents with  . Cardiac follow-up    History of Present Illness: [name] [name] is a 60 y.o. female last seen in February.  She has maintained interval follow-up in the heart failure clinic, just recently seen by Dr. Shirlee Latch on May 13, I reviewed the note.  She is here for a routine visit, states that she has been doing reasonably well of late.  Weight has been stable, no orthopnea or PND.  She has had no dizziness or syncope, no device shocks.  Follow-up in anticoagulation clinic.  Recent INR 2.0.  She bruises easily but has had no spontaneous major bleeding problems.  I reviewed her recent lab work.  Follow-up with Dr. Ladona Ridgel in April.  She underwent VT ablation in March, has continued on both amiodarone and mexiletine for further rhythm suppression. She has a Environmental manager ICD in place as well. Most recent device interrogation is not yet reported.  We went over her medications which are outlined below.  She reports compliance with therapy.  Past Medical History:  Diagnosis Date  . Aneurysm of internal carotid artery   . Anticardiolipin antibody positive    Chronic Coumadin  . Brain aneurysm    followed by Dr. Corliss Skains  . Chronic combined systolic (congestive) and diastolic (congestive) heart failure (HCC)   . Cocaine abuse in remission (HCC)   . Coronary atherosclerosis of native coronary artery    Previous invasive cardiac testing was done at a facility in [place], [place].  Occluded diagonal, Diffuse LAD disease, Occluded Cx, and non obstructive RCA.   . Essential hypertension   . Headache   . History of pneumonia   . ICD (implantable cardioverter-defibrillator) in place   . Ischemic cardiomyopathy    LVEF 30-35%  . Mixed hyperlipidemia   .  NSVT (nonsustained ventricular tachycardia) (HCC)   . Persistent atrial fibrillation (HCC)   . PVD (peripheral vascular disease) (HCC)   . Raynaud's phenomenon   . Statin intolerance   . Stroke [facility]) 2007   Total occlusion of the right internal carotid    Past Surgical History:  Procedure Laterality Date  . ABDOMINAL AORTAGRAM N/A 05/25/2014   Procedure: ABDOMINAL Ronny Flurry;  Surgeon: Iran Ouch, MD;  Location: [facility] CATH LAB;  Service: Cardiovascular;  Laterality: N/A;  . APPENDECTOMY    . CARDIAC DEFIBRILLATOR PLACEMENT     [employer] ICD  . CARDIOVERSION N/A 12/01/2020   Procedure: CARDIOVERSION;  Surgeon: [name], MD;  Location: [facility] ENDOSCOPY;  Service: Cardiovascular;  Laterality: N/A;  . CORONARY STENT INTERVENTION N/A 01/08/2021   Procedure: CORONARY STENT INTERVENTION;  Surgeon: Swaziland, Peter M, MD;  Location: [facility] INVASIVE CV LAB;  Service: Cardiovascular;  Laterality: N/A;  . EP IMPLANTABLE DEVICE N/A 03/01/2016   Procedure: ICD Generator Changeout;  Surgeon: [name], MD;  Location: [facility] INVASIVE CV LAB;  Service: Cardiovascular;  Laterality: N/A;  . ICD/BIV ICD FUNCTION(DFT) TEST N/A 03/14/2021   Procedure: ICD/BIV ICD FUNCTION (DFT) TEST;  Surgeon: [name], MD;  Location: [facility] INVASIVE CV LAB;  Service: Cardiovascular;  Laterality: N/A;  . L1 corpectomy   12/10  . Laryngeal polyp excision    . RIGHT/LEFT HEART CATH AND CORONARY ANGIOGRAPHY N/A 12/18/2020   Procedure: RIGHT/LEFT HEART CATH  AND CORONARY ANGIOGRAPHY;  Surgeon: Dolores Patty, MD;  Location: [facility] INVASIVE CV LAB;  Service: Cardiovascular;  Laterality: N/A;  . RIGHT/LEFT HEART CATH AND CORONARY ANGIOGRAPHY N/A 03/05/2021   Procedure: RIGHT/LEFT HEART CATH AND CORONARY ANGIOGRAPHY;  Surgeon: [name], MD;  Location: [facility] INVASIVE CV LAB;  Service: Cardiovascular;  Laterality: N/A;  . TEE WITHOUT CARDIOVERSION N/A 12/01/2020   Procedure: TRANSESOPHAGEAL ECHOCARDIOGRAM (TEE);  Surgeon: [name], MD;  Location: [facility] ENDOSCOPY;  Service: Cardiovascular;  Laterality: N/A;  Floyce Stakes ABLATION N/A 03/08/2021   Procedure: Floyce Stakes ABLATION;  Surgeon: Lanier Prude, MD;  Location: [facility] INVASIVE CV LAB;  Service: Cardiovascular;  Laterality: N/A;    Current Outpatient Medications  Medication Sig Dispense Refill  . ALPRAZolam (XANAX) 0.25 MG tablet Take 1 tablet (0.25 mg total) by mouth at bedtime as needed for anxiety. 30 tablet 2  . amiodarone (PACERONE) 200 MG tablet Take 400 mg (2 tablets) Monday thru Friday, On Saturday and Sunday take 200 mg (1 tablet) 145 tablet 3  . carvedilol (COREG) 12.5 MG tablet Take 1 tablet (12.5 mg total) by mouth 2 (two) times daily. 60 tablet 6  . clopidogrel (PLAVIX) 75 MG tablet Take 1 tablet (75 mg total) by mouth daily with breakfast. 30 tablet 11  . DULoxetine (CYMBALTA) 60 MG capsule Take 60 mg by mouth every evening.    . empagliflozin (JARDIANCE) 10 MG TABS tablet Take 1 tablet (10 mg total) by mouth daily before breakfast. 30 tablet 3  . ezetimibe (ZETIA) 10 MG tablet Take 1 tablet (10 mg total) by mouth daily. 30 tablet 3  . fluticasone (FLONASE) 50 MCG/ACT nasal spray Place 1 spray into both nostrils daily as needed for allergies.     . isosorbide mononitrate (IMDUR) 30 MG 24 hr tablet Take 0.5 tablets (15 mg total) by mouth daily. 15 tablet 6  . methocarbamol (ROBAXIN) 750 MG tablet Take 750 mg by mouth every 8 (eight) hours as needed for muscle spasms.    Marland Kitchen mexiletine (MEXITIL) 200 MG capsule Take 1 capsule (200 mg total) by mouth every 8 (eight) hours. 90 capsule 3  . Multiple Vitamins-Minerals (MULTIVITAMINS THER. W/MINERALS) TABS Take 1 tablet by mouth daily.      . nitroGLYCERIN (NITROSTAT) 0.4 MG SL tablet PLACE 1 TABLET UNDER THE TONGUE EVERY 5 MINUTES FOR 3 DOSES AS NEEDED CHEST PAIN 25 tablet 3  . oxyCODONE-acetaminophen (PERCOCET) 10-325 MG tablet Take 1 tablet by mouth every 4 (four) hours as needed for pain.    . pantoprazole  (PROTONIX) 40 MG tablet Take 1 tablet (40 mg total) by mouth daily. 30 tablet 11  . potassium chloride SA (KLOR-CON) 20 MEQ tablet Take 2 tablets (40 mEq total) by mouth daily. 180 tablet 1  . promethazine (PHENERGAN) 25 MG tablet Take 25 mg by mouth every 6 (six) hours as needed for nausea. With pain medication    . sacubitril-valsartan (ENTRESTO) 24-26 MG Take 1 tablet by mouth 2 (two) times daily.    Marland Kitchen spironolactone (ALDACTONE) 25 MG tablet Take 1 tablet (25 mg total) by mouth daily. 30 tablet 3  . torsemide 60 MG TABS Take 60 mg by mouth daily. 30 tablet 6  . traZODone (DESYREL) 100 MG tablet Take 100 mg by mouth at bedtime as needed for sleep.    Marland Kitchen warfarin (COUMADIN) 10 MG tablet TAKE 1/2 TABLET BY MOUTH EVERY DAY EXCEPT 1 TABLET ON SUNDAYS AND THURSDAYS (Patient taking differently: Take 5-10  mg by mouth See admin instructions. 5 mg Monday,Tuesday,Wednesday,Friday,Saturday. 10 mg Sunday and thursday) 30 tablet 3   No current facility-administered medications for this visit.   Allergies:  Beef-derived products, Metoclopramide hcl, Penicillins, Pork-derived products, Metoprolol, Reglan [metoclopramide], and Statins   ROS: No syncope.  Physical Exam: VS:  BP 122/68   Pulse 61   Ht 5\' 6"  (1.676 m)   Wt 200 lb (90.7 kg)   SpO2 95%   BMI 32.28 kg/m , BMI Body mass index is 32.28 kg/m.  Wt Readings from Last 3 Encounters:  05/17/21 200 lb (90.7 kg)  05/11/21 198 lb 6.4 oz (90 kg)  04/17/21 197 lb (89.4 kg)    General: Patient appears comfortable at rest. HEENT: Conjunctiva and lids normal, wearing a mask. Neck: Supple, no elevated JVP or carotid bruits, no thyromegaly. Lungs: Clear to auscultation, nonlabored breathing at rest. Cardiac: Regular rate and rhythm, no S3 or significant systolic murmur, no pericardial rub. Extremities: Trace ankle edema.  ECG:  An ECG dated 05/11/2021 was personally reviewed today and demonstrated:  Sinus bradycardia with left bundle branch  block.  Recent Labwork: 03/01/2021: B Natriuretic Peptide 194.0 03/11/2021: Magnesium 2.3 04/04/2021: Hemoglobin 14.2; Platelets 152 05/11/2021: ALT 28; AST 26; BUN 13; Creatinine, Ser 1.03; Potassium 4.1; Sodium 136; TSH 2.194     Component Value Date/Time   CHOL 120 12/16/2020 0341   TRIG 85 12/16/2020 0341   HDL 31 (L) 12/16/2020 0341   CHOLHDL 3.9 12/16/2020 0341   VLDL 17 12/16/2020 0341   LDLCALC 72 12/16/2020 0341    Other Studies Reviewed Today:  TEE 12/01/2020: 1. Left ventricular ejection fraction, by estimation, is 25 to 30%. The  left ventricle has severely decreased function. The left ventricle  demonstrates regional wall motion abnormalities with severe hypokinesis to  akinesis of the anterior and septal  walls. The left ventricular internal cavity size was mildly dilated.  2. Right ventricular systolic function is mildly reduced. The right  ventricular size is normal.  3. Left atrial size was moderately dilated. No left atrial/left atrial  appendage thrombus was detected.  4. Right atrial size was mildly dilated.  5. There was moderate mitral regurgitation with ERO 0.26 cm^2 by PISA  (suspect functional MR). No evidence of mitral stenosis.  6. No PFO/ASD by color doppler.  7. The aortic valve is tricuspid. Aortic valve regurgitation is not  visualized. No aortic stenosis is present.  8. Peak RV-RA gradient 18 mmHg.  9. Normal caliber thoracic aorta with no significant plaque.   Cardiac catheterization 03/05/2021: Hemodynamics (mmHg) RA 1 RV 32/1 PA 19/4, mean 12 PCWP mean 7 LV 121/6 AO 124/57  Oxygen saturations: PA 73% AO 99%  Cardiac Output (Fick) 5.22  Cardiac Index (Fick) 2.69  1. Low filling pressures.  2. Preserved cardiac output.  3. Stable coronaries, patent LCx stent.  Known chronic occlusion of D1.  Assessment and Plan:  1.  Ischemic cardiomyopathy with LVEF 25 to 30%, mildly reduced RV contraction.  She has chronic systolic heart  failure, clinically stable at this time without significant fluid overload.  Continue Coreg, Jardiance, Entresto, and Aldactone.  2.  CAD with diffusely diseased LAD, PCI of the proximal circumflex in January of this year, and CTO of the first diagonal.  No active angina at this point.  Continue Plavix, Imdur, and Zetia.  She has a history of statin intolerance.  Last LDL 72.  3.  Persistent atrial fibrillation, maintaining sinus rhythm since TEE guided cardioversion in  December 2021.  She remains on Coumadin for stroke prophylaxis.  4.  Recurrent VT, potentially scar mediated.  She has a Environmental manager ICD in place and is also undergone VT ablation with Dr. Ladona Ridgel.  She continues on amiodarone and mexiletine for further rhythm suppression.  Medication Adjustments/Labs and Tests Ordered: Current medicines are reviewed at length with the patient today.  Concerns regarding medicines are outlined above.   Tests Ordered: No orders of the defined types were placed in this encounter.   Medication Changes: No orders of the defined types were placed in this encounter.   Disposition:  Follow up 6 months with interval follow-up in the heart failure clinic.  Signed, [name], MD, [facility] 05/17/2021 2:59 PM    [facility] [address] [place], [place], [place] [address] Phone: [phone]; Fax: [phone]

## 2021-05-22 ENCOUNTER — Other Ambulatory Visit: Payer: Self-pay

## 2021-05-22 ENCOUNTER — Encounter: Payer: Self-pay | Admitting: Family Medicine

## 2021-05-22 ENCOUNTER — Ambulatory Visit (INDEPENDENT_AMBULATORY_CARE_PROVIDER_SITE_OTHER): Payer: Medicare PPO | Admitting: Family Medicine

## 2021-05-22 VITALS — BP 123/78 | HR 64 | Temp 97.5°F | Ht 66.0 in | Wt 194.0 lb

## 2021-05-22 DIAGNOSIS — K219 Gastro-esophageal reflux disease without esophagitis: Secondary | ICD-10-CM | POA: Diagnosis not present

## 2021-05-22 DIAGNOSIS — Z91018 Allergy to other foods: Secondary | ICD-10-CM | POA: Diagnosis not present

## 2021-05-22 DIAGNOSIS — Z1211 Encounter for screening for malignant neoplasm of colon: Secondary | ICD-10-CM

## 2021-05-22 DIAGNOSIS — R7303 Prediabetes: Secondary | ICD-10-CM

## 2021-05-22 DIAGNOSIS — Z780 Asymptomatic menopausal state: Secondary | ICD-10-CM | POA: Diagnosis not present

## 2021-05-22 DIAGNOSIS — G8921 Chronic pain due to trauma: Secondary | ICD-10-CM

## 2021-05-22 LAB — BAYER DCA HB A1C WAIVED: HB A1C (BAYER DCA - WAIVED): 5.2 %

## 2021-05-22 NOTE — Progress Notes (Signed)
New Patient Office Visit  Assessment & Plan:  1. Allergy to alpha-gal Has EpiPen for emergency use. - Alpha-Gal Panel  2. Prediabetes Labs to assess. - Bayer DCA Hb A1c Waived  3. Gastroesophageal reflux disease without esophagitis Well controlled on current regimen.   4. Postmenopausal Advised if she has any spotting she needs to let us know.  5. Chronic pain due to trauma - Ambulatory referral to Neurology  6. Colon cancer screening - Cologuard   Follow-up: Return in about 3 months (around 08/22/2021) for annual physical w. pap.   [name] Boston, MSN, APRN, FNP-C [facility]  Subjective:  Patient ID: [name] [name], female    DOB: 24-Sep-1961  Age: 60 y.o. MRN: [id number]  Patient Care Team: [name], FNP as PCP - General (Family Medicine) [name], MD as PCP - Cardiology (Cardiology) [name], MD as PCP - Electrophysiology (Cardiology) Beryle Beams, MD as Consulting Physician (Neurology) Wyline Mood Dorothe Pea, MD as Consulting Physician (Cardiology)  CC:  Chief Complaint  Patient presents with  . New Patient (Initial Visit)    [facility] Internal   . Establish Care  . Medication Refill    Dr. Jerre Simon    HPI [name] [name] presents to establish care.   Patient reports her primary cardiologist is Dr. Shirlee Latch at the heart failure clinic.  She also sees Dr. Diona Browner and Dr. Ladona Ridgel due to her implantable defibrillator.  [name] manages her Coumadin.  She previously saw Dr. Corliss Skains, interventional radiologist, due to a brain aneurysm.  She states she does not plan to go back as she had stable scans for 2 years in a row and was told it was inoperable.  She is seeing Dr. Gerilyn Pilgrim for pain management.  She had an accident on a horse in 2010 that resulted in back surgery.  She is in need of a new referral since she has a new PCP.  Her oncologist is Dr. Truett Perna.  Alpha gal: Patient states she has an EpiPen at home that  she has not had to use.  She would like her levels redrawn today.  Prediabetes: Patient had an A1c of 5.9 on 12/14/2020.  States she has never had an A1c higher.  She was started on Jardiance for her heart failure several months ago.  GERD: Controlled with Protonix.   Review of Systems  Constitutional: Negative for chills, fever, malaise/fatigue and weight loss.  HENT: Negative for congestion, ear discharge, ear pain, nosebleeds, sinus pain, sore throat and tinnitus.   Eyes: Negative for blurred vision, double vision, pain, discharge and redness.  Respiratory: Negative for cough, shortness of breath and wheezing.   Cardiovascular: Positive for palpitations. Negative for chest pain and leg swelling.  Gastrointestinal: Positive for heartburn. Negative for abdominal pain, constipation, diarrhea, nausea and vomiting.  Genitourinary: Negative for dysuria, frequency and urgency.  Musculoskeletal: Negative for myalgias.  Skin: Negative for rash.  Neurological: Negative for dizziness, seizures, weakness and headaches.  Psychiatric/Behavioral: Negative for depression, substance abuse and suicidal ideas. The patient is nervous/anxious.     Current Outpatient Medications:  .  ALPRAZolam (XANAX) 0.25 MG tablet, Take 1 tablet (0.25 mg total) by mouth at bedtime as needed for anxiety., Disp: 30 tablet, Rfl: 2 .  amiodarone (PACERONE) 200 MG tablet, Take 400 mg (2 tablets) Monday thru Friday, On Saturday and Sunday take 200 mg (1 tablet), Disp: 145 tablet, Rfl: 3 .  carvedilol (COREG) 12.5 MG tablet, Take 1 tablet (12.5 mg  total) by mouth 2 (two) times daily., Disp: 60 tablet, Rfl: 6 .  clopidogrel (PLAVIX) 75 MG tablet, Take 1 tablet (75 mg total) by mouth daily with breakfast., Disp: 30 tablet, Rfl: 11 .  DULoxetine (CYMBALTA) 60 MG capsule, Take 60 mg by mouth every evening., Disp: , Rfl:  .  empagliflozin (JARDIANCE) 10 MG TABS tablet, Take 1 tablet (10 mg total) by mouth daily before breakfast.,  Disp: 30 tablet, Rfl: 3 .  ezetimibe (ZETIA) 10 MG tablet, TAKE 1 TABLET BY MOUTH EVERY DAY, Disp: 30 tablet, Rfl: 5 .  fluticasone (FLONASE) 50 MCG/ACT nasal spray, Place 1 spray into both nostrils daily as needed for allergies. , Disp: , Rfl:  .  isosorbide mononitrate (IMDUR) 30 MG 24 hr tablet, Take 0.5 tablets (15 mg total) by mouth daily., Disp: 15 tablet, Rfl: 6 .  methocarbamol (ROBAXIN) 750 MG tablet, Take 750 mg by mouth every 8 (eight) hours as needed for muscle spasms., Disp: , Rfl:  .  mexiletine (MEXITIL) 200 MG capsule, Take 1 capsule (200 mg total) by mouth every 8 (eight) hours., Disp: 90 capsule, Rfl: 3 .  Multiple Vitamins-Minerals (MULTIVITAMINS THER. W/MINERALS) TABS, Take 1 tablet by mouth daily.  , Disp: , Rfl:  .  nitroGLYCERIN (NITROSTAT) 0.4 MG SL tablet, PLACE 1 TABLET UNDER THE TONGUE EVERY 5 MINUTES FOR 3 DOSES AS NEEDED CHEST PAIN, Disp: 25 tablet, Rfl: 3 .  oxyCODONE-acetaminophen (PERCOCET) 10-325 MG tablet, Take 1 tablet by mouth every 4 (four) hours as needed for pain., Disp: , Rfl:  .  pantoprazole (PROTONIX) 40 MG tablet, Take 1 tablet (40 mg total) by mouth daily., Disp: 30 tablet, Rfl: 11 .  potassium chloride SA (KLOR-CON) 20 MEQ tablet, Take 2 tablets (40 mEq total) by mouth daily., Disp: 180 tablet, Rfl: 1 .  sacubitril-valsartan (ENTRESTO) 24-26 MG, Take 1 tablet by mouth 2 (two) times daily., Disp: , Rfl:  .  spironolactone (ALDACTONE) 25 MG tablet, Take 1 tablet (25 mg total) by mouth daily., Disp: 30 tablet, Rfl: 3 .  torsemide 60 MG TABS, Take 60 mg by mouth daily., Disp: 30 tablet, Rfl: 6 .  traZODone (DESYREL) 100 MG tablet, Take 100 mg by mouth at bedtime as needed for sleep., Disp: , Rfl:  .  warfarin (COUMADIN) 10 MG tablet, TAKE 1/2 TABLET BY MOUTH EVERY DAY EXCEPT 1 TABLET ON SUNDAYS AND THURSDAYS (Patient taking differently: Take 5-10 mg by mouth See admin instructions. 5 mg Monday,Tuesday,Wednesday,Friday,Saturday. 10 mg Sunday and thursday),  Disp: 30 tablet, Rfl: 3  Allergies  Allergen Reactions  . Beef-Derived Products Anaphylaxis    From tick bite  . Metoclopramide Hcl Anaphylaxis    Non responsive  . Penicillins Anaphylaxis    Pt states she can take any cephalosporin (per pt. 01/01/19) Shock Has patient had a PCN reaction causing immediate rash, facial/tongue/throat swelling, SOB or lightheadedness with hypotension: Yes Has patient had a PCN reaction causing severe rash involving mucus membranes or skin necrosis: No Has patient had a PCN reaction that required hospitalization Yes Has patient had a PCN reaction occurring within the last 10 years: No If all of the above answers are "NO", then may proceed with Cephalosporin   . Pork-Derived Products Anaphylaxis    From tick bite. Has tolerated heparin/lovenox  . Metoprolol Other (See Comments)    Syncope   . Reglan [Metoclopramide]   . Statins Other (See Comments)    Myalgias    Past Medical History:  Diagnosis Date  .  Aneurysm of internal carotid artery   . Anti-phospholipid syndrome (HCC)   . Anticardiolipin antibody positive    Chronic Coumadin  . Brain aneurysm    followed by Dr. Corliss Skains  . Chronic combined systolic (congestive) and diastolic (congestive) heart failure (HCC)   . Cocaine abuse in remission (HCC)   . Coronary atherosclerosis of native coronary artery    Previous invasive cardiac testing was done at a facility in [place], [place].  Occluded diagonal, Diffuse LAD disease, Occluded Cx, and non obstructive RCA.   . Essential hypertension   . Headache   . History of pneumonia   . ICD (implantable cardioverter-defibrillator) in place   . Ischemic cardiomyopathy    LVEF 30-35%  . Mixed hyperlipidemia   . NSVT (nonsustained ventricular tachycardia) (HCC)   . Persistent atrial fibrillation (HCC)   . PVD (peripheral vascular disease) (HCC)   . Raynaud's phenomenon   . Statin intolerance   . Stroke [facility]) 2007   Total occlusion of the right  internal carotid    Past Surgical History:  Procedure Laterality Date  . ABDOMINAL AORTAGRAM N/A 05/25/2014   Procedure: ABDOMINAL Ronny Flurry;  Surgeon: Iran Ouch, MD;  Location: [facility] CATH LAB;  Service: Cardiovascular;  Laterality: N/A;  . APPENDECTOMY    . CARDIAC DEFIBRILLATOR PLACEMENT     [employer] ICD  . CARDIOVERSION N/A 12/01/2020   Procedure: CARDIOVERSION;  Surgeon: [name], MD;  Location: [facility] ENDOSCOPY;  Service: Cardiovascular;  Laterality: N/A;  . CORONARY STENT INTERVENTION N/A 01/08/2021   Procedure: CORONARY STENT INTERVENTION;  Surgeon: Swaz[place], Peter M, MD;  Location: [facility] INVASIVE CV LAB;  Service: Cardiovascular;  Laterality: N/A;  . EP IMPLANTABLE DEVICE N/A 03/01/2016   Procedure: ICD Generator Changeout;  Surgeon: [name], MD;  Location: [facility] INVASIVE CV LAB;  Service: Cardiovascular;  Laterality: N/A;  . ICD/BIV ICD FUNCTION(DFT) TEST N/A 03/14/2021   Procedure: ICD/BIV ICD FUNCTION (DFT) TEST;  Surgeon: [name], MD;  Location: [facility] INVASIVE CV LAB;  Service: Cardiovascular;  Laterality: N/A;  . L1 corpectomy   12/10  . Laryngeal polyp excision    . RIGHT/LEFT HEART CATH AND CORONARY ANGIOGRAPHY N/A 12/18/2020   Procedure: RIGHT/LEFT HEART CATH AND CORONARY ANGIOGRAPHY;  Surgeon: Dolores Patty, MD;  Location: [facility] INVASIVE CV LAB;  Service: Cardiovascular;  Laterality: N/A;  . RIGHT/LEFT HEART CATH AND CORONARY ANGIOGRAPHY N/A 03/05/2021   Procedure: RIGHT/LEFT HEART CATH AND CORONARY ANGIOGRAPHY;  Surgeon: [name], MD;  Location: [facility] INVASIVE CV LAB;  Service: Cardiovascular;  Laterality: N/A;  . TEE WITHOUT CARDIOVERSION N/A 12/01/2020   Procedure: TRANSESOPHAGEAL ECHOCARDIOGRAM (TEE);  Surgeon: [name], MD;  Location: [facility] ENDOSCOPY;  Service: Cardiovascular;  Laterality: N/A;  Floyce Stakes ABLATION N/A 03/08/2021   Procedure: Floyce Stakes ABLATION;  Surgeon: Lanier Prude, MD;  Location: [facility] INVASIVE CV LAB;  Service: Cardiovascular;   Laterality: N/A;    Family History  Problem Relation Age of Onset  . Heart disease Father 69  . Hyperlipidemia Father   . Hypertension Father   . Ankylosing spondylitis Sister   . Heart disease Sister   . Hypertension Sister   . Stomach cancer Brother 25  . Heart attack Brother     Social History   Socioeconomic History  . Marital status: Single    Spouse name: Not on file  . Number of children: Not on file  . Years of education: Not on file  . Highest education level: Not  on file  Occupational History  . Occupation: Disabled    Comment: ESL  Tobacco Use  . Smoking status: Current Some Day Smoker    Packs/day: 0.25    Years: 43.00    Pack years: 10.75    Types: Cigarettes, E-cigarettes    Start date: 03/03/1978  . Smokeless tobacco: Never Used  . Tobacco comment: 4 ciggs per day  Vaping Use  . Vaping Use: Every day  Substance and Sexual Activity  . Alcohol use: No    Alcohol/week: 0.0 standard drinks  . Drug use: No    Comment: Prior history of cocaine  . Sexual activity: Not Currently    Birth control/protection: None  Other Topics Concern  . Not on file  Social History Narrative  . Not on file   Social Determinants of Health   Financial Resource Strain: Low Risk   . Difficulty of Paying Living Expenses: Not very hard  Food Insecurity: No Food Insecurity  . Worried About Programme researcher, broadcasting/film/video in the Last Year: Never true  . Ran Out of Food in the Last Year: Never true  Transportation Needs: No Transportation Needs  . Lack of Transportation (Medical): No  . Lack of Transportation (Non-Medical): No  Physical Activity: Not on file  Stress: Not on file  Social Connections: Not on file  Intimate Partner Violence: Not on file    Objective:   Today's Vitals: BP 123/78   Pulse 64   Temp (!) 97.5 F (36.4 C) (Temporal)   Ht 5\' 6"  (1.676 m)   Wt 194 lb (88 kg)   SpO2 98%   BMI 31.31 kg/m   Physical Exam Vitals reviewed.  Constitutional:      General:  She is not in acute distress.    Appearance: Normal appearance. She is obese. She is not ill-appearing, toxic-appearing or diaphoretic.  HENT:     Head: Normocephalic and atraumatic.  Eyes:     General: No scleral icterus.       Right eye: No discharge.        Left eye: No discharge.     Conjunctiva/sclera: Conjunctivae normal.  Cardiovascular:     Rate and Rhythm: Normal rate and regular rhythm.     Heart sounds: Normal heart sounds. No murmur heard. No friction rub. No gallop.   Pulmonary:     Effort: Pulmonary effort is normal. No respiratory distress.     Breath sounds: Normal breath sounds. No stridor. No wheezing, rhonchi or rales.  Musculoskeletal:        General: Normal range of motion.     Cervical back: Normal range of motion.  Skin:    General: Skin is warm and dry.     Capillary Refill: Capillary refill takes less than 2 seconds.  Neurological:     General: No focal deficit present.     Mental Status: She is alert and oriented to person, place, and time. Mental status is at baseline.  Psychiatric:        Mood and Affect: Mood normal.        Behavior: Behavior normal.        Thought Content: Thought content normal.        Judgment: Judgment normal.

## 2021-05-23 ENCOUNTER — Encounter: Payer: Self-pay | Admitting: Family Medicine

## 2021-05-23 DIAGNOSIS — Z780 Asymptomatic menopausal state: Secondary | ICD-10-CM | POA: Insufficient documentation

## 2021-05-23 DIAGNOSIS — Z91018 Allergy to other foods: Secondary | ICD-10-CM | POA: Insufficient documentation

## 2021-05-23 LAB — COLOGUARD: Cologuard: NEGATIVE

## 2021-05-23 NOTE — Progress Notes (Signed)
COVID updated in chart

## 2021-05-25 ENCOUNTER — Telehealth: Payer: Self-pay | Admitting: Cardiology

## 2021-05-25 NOTE — Telephone Encounter (Signed)
Faxed chart review on 04/23/21 to the number [phone]. Wanting to confirm if it was received due to not hearing back.

## 2021-05-25 NOTE — Telephone Encounter (Signed)
Attempted to return call - "Welcome to [facility]" answers the line, then phone disconnects.  Tried number multiple times & got same thing.

## 2021-05-28 LAB — ALPHA-GAL PANEL
Allergen Lamb IgE: 9.61 kU/L — AB
Beef IgE: 21.4 kU/L — AB
IgE (Immunoglobulin E), Serum: 1518 IU/mL — ABNORMAL HIGH (ref 6–495)
O215-IgE Alpha-Gal: >100 kU/L — AB
Pork IgE: 9.59 kU/L — AB

## 2021-05-30 DIAGNOSIS — G4700 Insomnia, unspecified: Secondary | ICD-10-CM | POA: Diagnosis not present

## 2021-05-30 DIAGNOSIS — M545 Low back pain, unspecified: Secondary | ICD-10-CM | POA: Diagnosis not present

## 2021-05-30 DIAGNOSIS — G8912 Acute post-thoracotomy pain: Secondary | ICD-10-CM | POA: Diagnosis not present

## 2021-05-30 DIAGNOSIS — Z79891 Long term (current) use of opiate analgesic: Secondary | ICD-10-CM | POA: Diagnosis not present

## 2021-05-30 DIAGNOSIS — M25569 Pain in unspecified knee: Secondary | ICD-10-CM | POA: Diagnosis not present

## 2021-05-30 DIAGNOSIS — G8929 Other chronic pain: Secondary | ICD-10-CM | POA: Diagnosis not present

## 2021-05-31 ENCOUNTER — Encounter (HOSPITAL_COMMUNITY): Payer: Self-pay

## 2021-05-31 ENCOUNTER — Ambulatory Visit (INDEPENDENT_AMBULATORY_CARE_PROVIDER_SITE_OTHER): Payer: Medicare PPO | Admitting: *Deleted

## 2021-05-31 DIAGNOSIS — D6861 Antiphospholipid syndrome: Secondary | ICD-10-CM | POA: Diagnosis not present

## 2021-05-31 DIAGNOSIS — I63549 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: Secondary | ICD-10-CM | POA: Diagnosis not present

## 2021-05-31 DIAGNOSIS — Z5181 Encounter for therapeutic drug level monitoring: Secondary | ICD-10-CM

## 2021-05-31 DIAGNOSIS — D6859 Other primary thrombophilia: Secondary | ICD-10-CM | POA: Diagnosis not present

## 2021-05-31 LAB — POCT INR: INR: 1.6 — AB (ref 2.0–3.0)

## 2021-05-31 NOTE — Patient Instructions (Addendum)
Decreased Amiodarone 400mg  twice daily except 200mg  on Sat/Sun starting 4/23 Take warfarin 1 tablet tonight then increase dose to 1/2 tablet daily  Recheck in 2 wks

## 2021-06-01 ENCOUNTER — Ambulatory Visit (INDEPENDENT_AMBULATORY_CARE_PROVIDER_SITE_OTHER): Payer: Medicare PPO

## 2021-06-01 DIAGNOSIS — I4729 Other ventricular tachycardia: Secondary | ICD-10-CM

## 2021-06-01 DIAGNOSIS — I472 Ventricular tachycardia: Secondary | ICD-10-CM | POA: Diagnosis not present

## 2021-06-02 LAB — CUP PACEART REMOTE DEVICE CHECK
Battery Remaining Longevity: 114 mo
Battery Remaining Percentage: 100 %
Brady Statistic RA Percent Paced: 3 %
Brady Statistic RV Percent Paced: 2 %
Date Time Interrogation Session: 20220603021200
HighPow Impedance: 50 Ohm
Implantable Lead Implant Date: 20081119
Implantable Lead Implant Date: 20081119
Implantable Lead Location: 753859
Implantable Lead Location: 753860
Implantable Lead Model: 185
Implantable Lead Model: 4136
Implantable Lead Serial Number: 211124
Implantable Lead Serial Number: 28383796
Implantable Pulse Generator Implant Date: 20170303
Lead Channel Impedance Value: 481 Ohm
Lead Channel Impedance Value: 538 Ohm
Lead Channel Pacing Threshold Amplitude: 0.7 V
Lead Channel Pacing Threshold Amplitude: 1 V
Lead Channel Pacing Threshold Pulse Width: 0.5 ms
Lead Channel Pacing Threshold Pulse Width: 0.5 ms
Lead Channel Setting Pacing Amplitude: 2 V
Lead Channel Setting Pacing Amplitude: 2.4 V
Lead Channel Setting Pacing Pulse Width: 0.5 ms
Lead Channel Setting Sensing Sensitivity: 0.6 mV
Pulse Gen Serial Number: 204398

## 2021-06-05 ENCOUNTER — Telehealth: Payer: Self-pay | Admitting: Cardiology

## 2021-06-05 MED ORDER — SACUBITRIL-VALSARTAN 24-26 MG PO TABS: 1.0000 | ORAL_TABLET | Freq: Two times a day (BID) | ORAL | 3 refills | Status: DC

## 2021-06-05 NOTE — Telephone Encounter (Signed)
Request to renew [employer] PA[employer] application for entresto. Advised to bring 2022 proof of income and proof of prescription out of pocket expense to office and sign application when she comes. Verbalized understanding of plan.

## 2021-06-05 NOTE — Telephone Encounter (Signed)
Patient called stating that she needs to have paper work completed by Isabelle Course in regards to her Entresto 24-26 mg.

## 2021-06-09 ENCOUNTER — Encounter: Payer: Self-pay | Admitting: Family Medicine

## 2021-06-10 ENCOUNTER — Other Ambulatory Visit: Payer: Self-pay | Admitting: Physician Assistant

## 2021-06-14 ENCOUNTER — Ambulatory Visit (INDEPENDENT_AMBULATORY_CARE_PROVIDER_SITE_OTHER): Payer: Medicare PPO | Admitting: *Deleted

## 2021-06-14 DIAGNOSIS — I63549 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: Secondary | ICD-10-CM

## 2021-06-14 DIAGNOSIS — Z5181 Encounter for therapeutic drug level monitoring: Secondary | ICD-10-CM | POA: Diagnosis not present

## 2021-06-14 DIAGNOSIS — D6861 Antiphospholipid syndrome: Secondary | ICD-10-CM

## 2021-06-14 DIAGNOSIS — D6859 Other primary thrombophilia: Secondary | ICD-10-CM | POA: Diagnosis not present

## 2021-06-14 LAB — POCT INR: INR: 4.5 — AB (ref 2.0–3.0)

## 2021-06-14 NOTE — Patient Instructions (Signed)
Has been taking an increased dose of amiodarone by mistake.  Corrected dose on Tuesday. Hold warfarin tonight and tomorrow night then resume 1/2 tablet daily  Recheck in 2 wks

## 2021-06-16 ENCOUNTER — Other Ambulatory Visit (HOSPITAL_COMMUNITY): Payer: Self-pay | Admitting: Cardiology

## 2021-06-19 NOTE — Progress Notes (Signed)
Remote ICD transmission.   

## 2021-06-20 ENCOUNTER — Encounter: Payer: Self-pay | Admitting: *Deleted

## 2021-06-20 NOTE — Progress Notes (Signed)
Fax notification received from [employer] that entresto approved through 12/29/21.

## 2021-06-23 ENCOUNTER — Emergency Department (HOSPITAL_COMMUNITY): Payer: Medicare PPO

## 2021-06-23 ENCOUNTER — Encounter (HOSPITAL_COMMUNITY): Payer: Self-pay | Admitting: Emergency Medicine

## 2021-06-23 ENCOUNTER — Other Ambulatory Visit: Payer: Self-pay

## 2021-06-23 ENCOUNTER — Inpatient Hospital Stay (HOSPITAL_COMMUNITY)
Admission: EM | Admit: 2021-06-23 | Discharge: 2021-06-26 | DRG: 309 | Disposition: A | Discharge status: Home / Self Care | Payer: Medicare PPO | Attending: Internal Medicine | Admitting: Internal Medicine

## 2021-06-23 DIAGNOSIS — S0003XA Contusion of scalp, initial encounter: Secondary | ICD-10-CM | POA: Diagnosis present

## 2021-06-23 DIAGNOSIS — R0902 Hypoxemia: Secondary | ICD-10-CM | POA: Diagnosis not present

## 2021-06-23 DIAGNOSIS — Z7901 Long term (current) use of anticoagulants: Secondary | ICD-10-CM

## 2021-06-23 DIAGNOSIS — Z7902 Long term (current) use of antithrombotics/antiplatelets: Secondary | ICD-10-CM | POA: Diagnosis not present

## 2021-06-23 DIAGNOSIS — K219 Gastro-esophageal reflux disease without esophagitis: Secondary | ICD-10-CM | POA: Diagnosis present

## 2021-06-23 DIAGNOSIS — E782 Mixed hyperlipidemia: Secondary | ICD-10-CM | POA: Diagnosis present

## 2021-06-23 DIAGNOSIS — I472 Ventricular tachycardia, unspecified: Secondary | ICD-10-CM

## 2021-06-23 DIAGNOSIS — Z79899 Other long term (current) drug therapy: Secondary | ICD-10-CM | POA: Diagnosis not present

## 2021-06-23 DIAGNOSIS — Z83438 Family history of other disorder of lipoprotein metabolism and other lipidemia: Secondary | ICD-10-CM

## 2021-06-23 DIAGNOSIS — I5042 Chronic combined systolic (congestive) and diastolic (congestive) heart failure: Secondary | ICD-10-CM | POA: Diagnosis present

## 2021-06-23 DIAGNOSIS — D6861 Antiphospholipid syndrome: Secondary | ICD-10-CM | POA: Diagnosis present

## 2021-06-23 DIAGNOSIS — I4819 Other persistent atrial fibrillation: Secondary | ICD-10-CM | POA: Diagnosis present

## 2021-06-23 DIAGNOSIS — Z7984 Long term (current) use of oral hypoglycemic drugs: Secondary | ICD-10-CM | POA: Diagnosis not present

## 2021-06-23 DIAGNOSIS — I4901 Ventricular fibrillation: Principal | ICD-10-CM | POA: Diagnosis present

## 2021-06-23 DIAGNOSIS — W19XXXA Unspecified fall, initial encounter: Secondary | ICD-10-CM | POA: Diagnosis present

## 2021-06-23 DIAGNOSIS — I6521 Occlusion and stenosis of right carotid artery: Secondary | ICD-10-CM | POA: Diagnosis not present

## 2021-06-23 DIAGNOSIS — Z9049 Acquired absence of other specified parts of digestive tract: Secondary | ICD-10-CM | POA: Diagnosis not present

## 2021-06-23 DIAGNOSIS — Z91018 Allergy to other foods: Secondary | ICD-10-CM

## 2021-06-23 DIAGNOSIS — F17210 Nicotine dependence, cigarettes, uncomplicated: Secondary | ICD-10-CM | POA: Diagnosis present

## 2021-06-23 DIAGNOSIS — Z8673 Personal history of transient ischemic attack (TIA), and cerebral infarction without residual deficits: Secondary | ICD-10-CM

## 2021-06-23 DIAGNOSIS — I447 Left bundle-branch block, unspecified: Secondary | ICD-10-CM | POA: Diagnosis not present

## 2021-06-23 DIAGNOSIS — I255 Ischemic cardiomyopathy: Secondary | ICD-10-CM | POA: Diagnosis present

## 2021-06-23 DIAGNOSIS — F17200 Nicotine dependence, unspecified, uncomplicated: Secondary | ICD-10-CM | POA: Diagnosis present

## 2021-06-23 DIAGNOSIS — Z20822 Contact with and (suspected) exposure to covid-19: Secondary | ICD-10-CM | POA: Diagnosis present

## 2021-06-23 DIAGNOSIS — R0602 Shortness of breath: Secondary | ICD-10-CM

## 2021-06-23 DIAGNOSIS — I2510 Atherosclerotic heart disease of native coronary artery without angina pectoris: Secondary | ICD-10-CM | POA: Diagnosis present

## 2021-06-23 DIAGNOSIS — Z8249 Family history of ischemic heart disease and other diseases of the circulatory system: Secondary | ICD-10-CM

## 2021-06-23 DIAGNOSIS — Z4502 Encounter for adjustment and management of automatic implantable cardiac defibrillator: Secondary | ICD-10-CM

## 2021-06-23 DIAGNOSIS — F419 Anxiety disorder, unspecified: Secondary | ICD-10-CM | POA: Diagnosis present

## 2021-06-23 DIAGNOSIS — R55 Syncope and collapse: Secondary | ICD-10-CM | POA: Diagnosis present

## 2021-06-23 DIAGNOSIS — I959 Hypotension, unspecified: Secondary | ICD-10-CM | POA: Diagnosis not present

## 2021-06-23 DIAGNOSIS — Z95810 Presence of automatic (implantable) cardiac defibrillator: Secondary | ICD-10-CM | POA: Diagnosis not present

## 2021-06-23 DIAGNOSIS — Z888 Allergy status to other drugs, medicaments and biological substances status: Secondary | ICD-10-CM

## 2021-06-23 DIAGNOSIS — I739 Peripheral vascular disease, unspecified: Secondary | ICD-10-CM | POA: Diagnosis present

## 2021-06-23 DIAGNOSIS — I517 Cardiomegaly: Secondary | ICD-10-CM | POA: Diagnosis not present

## 2021-06-23 DIAGNOSIS — R000 Tachycardia, unspecified: Secondary | ICD-10-CM | POA: Diagnosis not present

## 2021-06-23 DIAGNOSIS — Z880 Allergy status to penicillin: Secondary | ICD-10-CM

## 2021-06-23 DIAGNOSIS — I679 Cerebrovascular disease, unspecified: Secondary | ICD-10-CM | POA: Diagnosis present

## 2021-06-23 DIAGNOSIS — I63549 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: Secondary | ICD-10-CM | POA: Diagnosis present

## 2021-06-23 DIAGNOSIS — I110 Hypertensive heart disease with heart failure: Secondary | ICD-10-CM | POA: Diagnosis present

## 2021-06-23 LAB — CBC WITH DIFFERENTIAL/PLATELET
Abs Immature Granulocytes: 0.02 10*3/uL (ref 0.00–0.07)
Basophils Absolute: 0.1 10*3/uL (ref 0.0–0.1)
Basophils Relative: 1 %
Eosinophils Absolute: 0.1 10*3/uL (ref 0.0–0.5)
Eosinophils Relative: 1 %
HCT: 44 % (ref 36.0–46.0)
Hemoglobin: 13.9 g/dL (ref 12.0–15.0)
Immature Granulocytes: 0 %
Lymphocytes Relative: 15 %
Lymphs Abs: 1.3 10*3/uL (ref 0.7–4.0)
MCH: 29.4 pg (ref 26.0–34.0)
MCHC: 31.6 g/dL (ref 30.0–36.0)
MCV: 93 fL (ref 80.0–100.0)
Monocytes Absolute: 0.6 10*3/uL (ref 0.1–1.0)
Monocytes Relative: 7 %
Neutro Abs: 6.3 10*3/uL (ref 1.7–7.7)
Neutrophils Relative %: 76 %
Platelets: 158 10*3/uL (ref 150–400)
RBC: 4.73 MIL/uL (ref 3.87–5.11)
RDW: 14.4 % (ref 11.5–15.5)
WBC: 8.3 10*3/uL (ref 4.0–10.5)
nRBC: 0 % (ref 0.0–0.2)

## 2021-06-23 LAB — COMPREHENSIVE METABOLIC PANEL
ALT: 27 U/L (ref 0–44)
AST: 26 U/L (ref 15–41)
Albumin: 3.8 g/dL (ref 3.5–5.0)
Alkaline Phosphatase: 105 U/L (ref 38–126)
Anion gap: 9 (ref 5–15)
BUN: 24 mg/dL — ABNORMAL HIGH (ref 6–20)
CO2: 27 mmol/L (ref 22–32)
Calcium: 9 mg/dL (ref 8.9–10.3)
Chloride: 100 mmol/L (ref 98–111)
Creatinine, Ser: 1.11 mg/dL — ABNORMAL HIGH (ref 0.44–1.00)
GFR, Estimated: 58 mL/min — ABNORMAL LOW (ref >60)
Glucose, Bld: 141 mg/dL — ABNORMAL HIGH (ref 70–99)
Potassium: 3.6 mmol/L (ref 3.5–5.1)
Sodium: 136 mmol/L (ref 135–145)
Total Bilirubin: 0.4 mg/dL (ref 0.3–1.2)
Total Protein: 7.4 g/dL (ref 6.5–8.1)

## 2021-06-23 LAB — TROPONIN I (HIGH SENSITIVITY)
Troponin I (High Sensitivity): 22 ng/L — ABNORMAL HIGH (ref <18)
Troponin I (High Sensitivity): 36 ng/L — ABNORMAL HIGH (ref <18)

## 2021-06-23 LAB — TSH: TSH: 3.401 u[IU]/mL (ref 0.350–4.500)

## 2021-06-23 LAB — PROTIME-INR
INR: 1.8 — ABNORMAL HIGH (ref 0.8–1.2)
Prothrombin Time: 21 seconds — ABNORMAL HIGH (ref 11.4–15.2)

## 2021-06-23 LAB — RESP PANEL BY RT-PCR (FLU A&B, COVID) ARPGX2
Influenza A by PCR: NEGATIVE
Influenza B by PCR: NEGATIVE
SARS Coronavirus 2 by RT PCR: NEGATIVE

## 2021-06-23 LAB — MAGNESIUM: Magnesium: 2.3 mg/dL (ref 1.7–2.4)

## 2021-06-23 LAB — PHOSPHORUS: Phosphorus: 5.3 mg/dL — ABNORMAL HIGH (ref 2.5–4.6)

## 2021-06-23 MED ORDER — ADULT MULTIVITAMIN W/MINERALS CH: 1.0000 | ORAL_TABLET | Freq: Every day | ORAL | Status: DC

## 2021-06-23 MED ORDER — SODIUM CHLORIDE 0.9% FLUSH
3.0000 mL | Freq: Two times a day (BID) | INTRAVENOUS | Status: DC
  Administered 2021-06-23 – 2021-06-26 (×6): 3 mL via INTRAVENOUS

## 2021-06-23 MED ORDER — SENNOSIDES-DOCUSATE SODIUM 8.6-50 MG PO TABS
1.0000 | ORAL_TABLET | Freq: Every day | ORAL | Status: DC
  Administered 2021-06-24 – 2021-06-26 (×3): 1 via ORAL
  Filled 2021-06-23 (×3): qty 1

## 2021-06-23 MED ORDER — NITROGLYCERIN 0.4 MG SL SUBL: 0.4000 mg | SUBLINGUAL_TABLET | SUBLINGUAL | Status: DC | PRN

## 2021-06-23 MED ORDER — OXYCODONE-ACETAMINOPHEN 5-325 MG PO TABS
1.0000 | ORAL_TABLET | Freq: Once | ORAL | Status: AC
  Administered 2021-06-23: 1 via ORAL
  Filled 2021-06-23: qty 1

## 2021-06-23 MED ORDER — CARVEDILOL 12.5 MG PO TABS
12.5000 mg | ORAL_TABLET | Freq: Once | ORAL | Status: AC
  Administered 2021-06-23: 12.5 mg via ORAL
  Filled 2021-06-23: qty 1

## 2021-06-23 MED ORDER — TRAZODONE HCL 50 MG PO TABS
100.0000 mg | ORAL_TABLET | Freq: Every evening | ORAL | Status: DC | PRN
  Filled 2021-06-23: qty 2

## 2021-06-23 MED ORDER — TORSEMIDE 20 MG PO TABS
60.0000 mg | ORAL_TABLET | Freq: Every day | ORAL | Status: DC
  Administered 2021-06-24 – 2021-06-26 (×3): 60 mg via ORAL
  Filled 2021-06-23 (×3): qty 3

## 2021-06-23 MED ORDER — SPIRONOLACTONE 25 MG PO TABS
25.0000 mg | ORAL_TABLET | Freq: Every evening | ORAL | Status: DC
  Administered 2021-06-24 – 2021-06-25 (×2): 25 mg via ORAL
  Filled 2021-06-23 (×2): qty 1

## 2021-06-23 MED ORDER — SACUBITRIL-VALSARTAN 24-26 MG PO TABS
1.0000 | ORAL_TABLET | Freq: Once | ORAL | Status: AC
  Administered 2021-06-23: 1 via ORAL
  Filled 2021-06-23: qty 1

## 2021-06-23 MED ORDER — POLYETHYLENE GLYCOL 3350 17 G PO PACK: 17.0000 g | PACK | Freq: Every day | ORAL | Status: DC | PRN

## 2021-06-23 MED ORDER — OXYCODONE-ACETAMINOPHEN 10-325 MG PO TABS: 1.0000 | ORAL_TABLET | ORAL | Status: DC | PRN

## 2021-06-23 MED ORDER — POTASSIUM CHLORIDE CRYS ER 20 MEQ PO TBCR
20.0000 meq | EXTENDED_RELEASE_TABLET | Freq: Two times a day (BID) | ORAL | Status: DC
  Administered 2021-06-24 – 2021-06-26 (×6): 20 meq via ORAL
  Filled 2021-06-23 (×6): qty 1

## 2021-06-23 MED ORDER — MEXILETINE HCL 200 MG PO CAPS
200.0000 mg | ORAL_CAPSULE | Freq: Three times a day (TID) | ORAL | Status: DC
  Administered 2021-06-24 – 2021-06-26 (×7): 200 mg via ORAL
  Filled 2021-06-23 (×9): qty 1

## 2021-06-23 MED ORDER — AMIODARONE HCL 200 MG PO TABS
400.0000 mg | ORAL_TABLET | ORAL | Status: DC
  Administered 2021-06-25 – 2021-06-26 (×2): 400 mg via ORAL
  Filled 2021-06-23: qty 2

## 2021-06-23 MED ORDER — ISOSORBIDE MONONITRATE ER 30 MG PO TB24
15.0000 mg | ORAL_TABLET | Freq: Every day | ORAL | Status: DC
  Administered 2021-06-24 – 2021-06-26 (×3): 15 mg via ORAL
  Filled 2021-06-23 (×3): qty 1

## 2021-06-23 MED ORDER — CARVEDILOL 12.5 MG PO TABS
12.5000 mg | ORAL_TABLET | Freq: Two times a day (BID) | ORAL | Status: DC
  Administered 2021-06-24 – 2021-06-26 (×2): 12.5 mg via ORAL
  Filled 2021-06-23 (×5): qty 1

## 2021-06-23 MED ORDER — POTASSIUM CHLORIDE CRYS ER 20 MEQ PO TBCR
40.0000 meq | EXTENDED_RELEASE_TABLET | ORAL | Status: AC
  Administered 2021-06-24 – 2021-06-25 (×2): 40 meq via ORAL
  Filled 2021-06-23 (×2): qty 2

## 2021-06-23 MED ORDER — ONDANSETRON HCL 4 MG PO TABS: 4.0000 mg | ORAL_TABLET | Freq: Four times a day (QID) | ORAL | Status: DC | PRN

## 2021-06-23 MED ORDER — DULOXETINE HCL 60 MG PO CPEP
60.0000 mg | ORAL_CAPSULE | Freq: Every day | ORAL | Status: DC
  Administered 2021-06-23 – 2021-06-25 (×3): 60 mg via ORAL
  Filled 2021-06-23: qty 2
  Filled 2021-06-23 (×2): qty 1

## 2021-06-23 MED ORDER — SODIUM CHLORIDE 0.9% FLUSH
3.0000 mL | Freq: Two times a day (BID) | INTRAVENOUS | Status: DC
  Administered 2021-06-25: 3 mL via INTRAVENOUS

## 2021-06-23 MED ORDER — ONDANSETRON HCL 4 MG/2ML IJ SOLN: 4.0000 mg | Freq: Four times a day (QID) | INTRAMUSCULAR | Status: DC | PRN

## 2021-06-23 MED ORDER — OXYCODONE-ACETAMINOPHEN 5-325 MG PO TABS
1.0000 | ORAL_TABLET | ORAL | Status: DC | PRN
  Administered 2021-06-24 – 2021-06-26 (×13): 1 via ORAL
  Filled 2021-06-23 (×13): qty 1

## 2021-06-23 MED ORDER — METHOCARBAMOL 500 MG PO TABS
750.0000 mg | ORAL_TABLET | Freq: Three times a day (TID) | ORAL | Status: DC | PRN
  Administered 2021-06-25 – 2021-06-26 (×2): 750 mg via ORAL
  Filled 2021-06-23 (×2): qty 2

## 2021-06-23 MED ORDER — MEXILETINE HCL 200 MG PO CAPS
200.0000 mg | ORAL_CAPSULE | Freq: Once | ORAL | Status: DC
  Filled 2021-06-23: qty 1

## 2021-06-23 MED ORDER — ACETAMINOPHEN 325 MG PO TABS: 650.0000 mg | ORAL_TABLET | Freq: Four times a day (QID) | ORAL | Status: DC | PRN

## 2021-06-23 MED ORDER — EZETIMIBE 10 MG PO TABS
10.0000 mg | ORAL_TABLET | Freq: Every day | ORAL | Status: DC
  Administered 2021-06-24 – 2021-06-26 (×3): 10 mg via ORAL
  Filled 2021-06-23 (×3): qty 1

## 2021-06-23 MED ORDER — ALPRAZOLAM 0.25 MG PO TABS
0.2500 mg | ORAL_TABLET | Freq: Every evening | ORAL | Status: DC | PRN
  Filled 2021-06-23: qty 1

## 2021-06-23 MED ORDER — SODIUM CHLORIDE 0.9% FLUSH: 3.0000 mL | INTRAVENOUS | Status: DC | PRN

## 2021-06-23 MED ORDER — PANTOPRAZOLE SODIUM 40 MG PO TBEC
40.0000 mg | DELAYED_RELEASE_TABLET | Freq: Every day | ORAL | Status: DC
  Administered 2021-06-24 – 2021-06-26 (×3): 40 mg via ORAL
  Filled 2021-06-23 (×3): qty 1

## 2021-06-23 MED ORDER — BISACODYL 10 MG RE SUPP: 10.0000 mg | Freq: Every day | RECTAL | Status: DC | PRN

## 2021-06-23 MED ORDER — TRAZODONE HCL 50 MG PO TABS
50.0000 mg | ORAL_TABLET | Freq: Every evening | ORAL | Status: DC | PRN
  Administered 2021-06-23 – 2021-06-25 (×2): 50 mg via ORAL
  Filled 2021-06-23: qty 1

## 2021-06-23 MED ORDER — SODIUM CHLORIDE 0.9 % IV SOLN: 250.0000 mL | INTRAVENOUS | Status: DC | PRN

## 2021-06-23 MED ORDER — OXYCODONE HCL 5 MG PO TABS
5.0000 mg | ORAL_TABLET | ORAL | Status: DC | PRN
  Administered 2021-06-24 – 2021-06-26 (×13): 5 mg via ORAL
  Filled 2021-06-23 (×13): qty 1

## 2021-06-23 MED ORDER — SPIRONOLACTONE 25 MG PO TABS
25.0000 mg | ORAL_TABLET | Freq: Once | ORAL | Status: AC
  Administered 2021-06-23: 25 mg via ORAL
  Filled 2021-06-23: qty 1

## 2021-06-23 MED ORDER — ACETAMINOPHEN 650 MG RE SUPP: 650.0000 mg | Freq: Four times a day (QID) | RECTAL | Status: DC | PRN

## 2021-06-23 MED ORDER — ADULT MULTIVITAMIN W/MINERALS CH
1.0000 | ORAL_TABLET | Freq: Every day | ORAL | Status: DC
  Administered 2021-06-24 – 2021-06-26 (×3): 1 via ORAL
  Filled 2021-06-23 (×3): qty 1

## 2021-06-23 MED ORDER — CLOPIDOGREL BISULFATE 75 MG PO TABS
75.0000 mg | ORAL_TABLET | Freq: Every day | ORAL | Status: DC
  Administered 2021-06-24 – 2021-06-26 (×3): 75 mg via ORAL
  Filled 2021-06-23 (×3): qty 1

## 2021-06-23 MED ORDER — SACUBITRIL-VALSARTAN 24-26 MG PO TABS
1.0000 | ORAL_TABLET | Freq: Two times a day (BID) | ORAL | Status: DC
  Administered 2021-06-24 – 2021-06-26 (×5): 1 via ORAL
  Filled 2021-06-23 (×5): qty 1

## 2021-06-23 NOTE — H&P (Signed)
Patient Demographics:    Bita Cartwright, is a 60 y.o. female  MRN: [id number]   DOB - 01/17/1961  Admit Date - 06/23/2021  Outpatient Primary MD for the patient is [name], FNP   Assessment & Plan:    Principal Problem:   Syncope Active Problems:   Chronic combined systolic (congestive) and diastolic (congestive) heart failure (HCC)   Ventricular tachycardia (HCC)   Defibrillator discharge   Cardiomyopathy, ischemic   Long term current use of anticoagulant   CAD in native artery   CEREBROVASCULAR DISEASE   Peripheral vascular disease (HCC)   Anticardiolipin syndrome (HCC)   Dual implantable cardioverter-defibrillator in situ   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)   Tobacco dependence   Persistent atrial fibrillation (HCC)   Right carotid artery occlusion  NB!! V. tach /V. fib related syncope with closed head injury and scalp hematoma in a patient who is on both Plavix and Coumadin--needs neuro checks and possible EP evaluation   1)Syncope--with complete loss of consciousness and closed head injury with scalp hematoma -AICD/Pacemaker was interrogated and showed multiple episodes of V. fib and V. tach.  She was shocked once during this episode -Admit to [facility] campus--- may need EP evaluation if recurrent episodes of V. tach/V. Fib --- Check magnesium and keep magnesium around 2 -Keep potassium around 4 -Check TSH and phosphorus  2) closed head injury--- with scalp hematomas ,secondary to #1 above, see photos in epic -CT head and CT C-spine without new acute findings at this time -Hold Coumadin tonight INR is 1.8 -Neurochecks--- consider repeat CT head if neuro changes noted  3) history of recurrent V. tach/V. Fib--arrhythmias  deemed to be potentially scar mediated ---status post [employer] AICD placement -Please see #1 above -Patient sees Dr. Ladona Ridgel from EP cardiology She underwent VT ablation in March 2022, has continued on both amiodarone and mexiletine for further rhythm suppression -AICD Device interrogation on 06/23/2021 as noted above #1  4)H/o persistent Afib-status post prior TEE with cardioversion December 2021--currently on Coumadin therapy, antiarrhythmic as above #3  5)CAD--status post LHC with proximal left circumflex stent in January 2022, repeat LHC on 03/05/2021 showed patent left circumflex stent--PTA was on Plavix, patient states intolerant to statins so takes Zetia, LDL was 72 -Continue Imdur  6) combined chronic diastolic and systolic dysfunction CHF/ischemic cardiomyopathy-EF 25 to 30% -Continue Coreg, Entresto, Aldactone, torsemide -Was also on Jardiance -Patient sees Dr. Ronelle Nigh for heart failure  7) Anti- cardiolipin syndrome/hypercoagulable syndrome=--- PTA was on Coumadin, INR 1.8, given close head injury with significant hematoma hold Coumadin tonight  8) tobacco abuse--- smoking cessation advised  Disposition/Need for in-Hospital Stay- patient unable to be discharged at this time due to --V. tach /V. fib related syncope with closed head injury and scalp hematoma in a patient who is on both Plavix and Coumadin--needs neuro checks and possible EP evaluation  Dispo: The patient  is from: Home              Anticipated d/c is to: Home              Anticipated d/c date is: 1 day              Patient currently is not medically stable to d/c. Barriers: Not Clinically Stable-    With History of - Reviewed by me  Past Medical History:  Diagnosis Date   Aneurysm of internal carotid artery    Anti-phospholipid syndrome (HCC)    Anticardiolipin antibody positive    Chronic Coumadin   Brain aneurysm    followed by Dr. Corliss Skains   Chronic combined systolic (congestive) and diastolic  (congestive) heart failure (HCC)    Cocaine abuse in remission [facility])    Coronary atherosclerosis of native coronary artery    Previous invasive cardiac testing was done at a facility in [place], [place].  Occluded diagonal, Diffuse LAD disease, Occluded Cx, and non obstructive RCA.    Essential hypertension    Headache    History of pneumonia    ICD (implantable cardioverter-defibrillator) in place    Ischemic cardiomyopathy    LVEF 30-35%   Mixed hyperlipidemia    NSVT (nonsustained ventricular tachycardia) (HCC)    Persistent atrial fibrillation (HCC)    PVD (peripheral vascular disease) (HCC)    Raynaud's phenomenon    Statin intolerance    Stroke [facility]) 2007   Total occlusion of the right internal carotid      Past Surgical History:  Procedure Laterality Date   ABDOMINAL AORTAGRAM N/A 05/25/2014   Procedure: ABDOMINAL Ronny Flurry;  Surgeon: Iran Ouch, MD;  Location: [facility] CATH LAB;  Service: Cardiovascular;  Laterality: N/A;   APPENDECTOMY     CARDIAC DEFIBRILLATOR PLACEMENT     [employer] ICD   CARDIOVERSION N/A 12/01/2020   Procedure: CARDIOVERSION;  Surgeon: [name], MD;  Location: [facility] ENDOSCOPY;  Service: Cardiovascular;  Laterality: N/A;   CORONARY STENT INTERVENTION N/A 01/08/2021   Procedure: CORONARY STENT INTERVENTION;  Surgeon: Swaziland, Peter M, MD;  Location: [facility] INVASIVE CV LAB;  Service: Cardiovascular;  Laterality: N/A;   EP IMPLANTABLE DEVICE N/A 03/01/2016   Procedure: ICD Generator Changeout;  Surgeon: [name], MD;  Location: [facility] INVASIVE CV LAB;  Service: Cardiovascular;  Laterality: N/A;   ICD/BIV ICD FUNCTION(DFT) TEST N/A 03/14/2021   Procedure: ICD/BIV ICD FUNCTION (DFT) TEST;  Surgeon: [name], MD;  Location: [facility] INVASIVE CV LAB;  Service: Cardiovascular;  Laterality: N/A;   L1 corpectomy   12/10   Laryngeal polyp excision     RIGHT/LEFT HEART CATH AND CORONARY ANGIOGRAPHY N/A 12/18/2020   Procedure: RIGHT/LEFT HEART CATH AND CORONARY  ANGIOGRAPHY;  Surgeon: Dolores Patty, MD;  Location: [facility] INVASIVE CV LAB;  Service: Cardiovascular;  Laterality: N/A;   RIGHT/LEFT HEART CATH AND CORONARY ANGIOGRAPHY N/A 03/05/2021   Procedure: RIGHT/LEFT HEART CATH AND CORONARY ANGIOGRAPHY;  Surgeon: [name], MD;  Location[facility] INVASIVE CV LAB;  Service: Cardiovascular;  Laterality: N/A;   TEE WITHOUT CARDIOVERSION N/A 12/01/2020   Procedure: TRANSESOPHAGEAL ECHOCARDIOGRAM (TEE);  Surgeon: [name], MD;  Location: [facility] ENDOSCOPY;  Service: Cardiovascular;  Laterality: N/AFloyce Stakes ABLATION N/A 03/08/2021   Procedure: Floyce Stakes ABLATION;  Surgeon: Lanier Prude, MD;  Location: [facility] INVASIVE CV LAB;  Service: Cardiovascular;  Laterality: N/A;      Chief Complaint  Patient presents with   Fall  HPI:    Aamina Beaudoin  is a 60 y.o. female smoker with past medical history relevant for recurrent V. tach/V. fib, persistent A. fib status post prior TEE with cardioversion, combined systolic and diastolic dysfunction CHF with last known EF of 25 to 30% in the setting of ischemic cardiomyopathy, CAD with left circumflex stent in January 2022, antiphospholipid syndrome requiring chronic anticoagulation with Coumadin who presents to the ED after episode of syncope and loss of consciousness with closed head injury and scalp hematomas -AICD/Pacemaker was interrogated and showed multiple episodes of V. fib and V. tach.  She was shocked once during this episode -Creatinine is 1.1, potassium is 3.6,, magnesium TSH and phosphorus are pending -CT head and CT C-spine without new acute findings at this time INR is 1.8, CBC WNL  -- Denies chest pains or palpitations at this time, no increased shortness of breath -No fever  Or chills   No Nausea, Vomiting or Diarrhea   Review of systems:    In addition to the HPI above,   A full Review of  Systems was done, all other systems reviewed are negative except as noted above in HPI , .     Social History:  Reviewed by me    Social History   Tobacco Use   Smoking status: Some Days    Packs/day: 0.25    Years: 43.00    Pack years: 10.75    Types: Cigarettes, E-cigarettes    Start date: 03/03/1978   Smokeless tobacco: Never   Tobacco comments:    4 ciggs per day  Substance Use Topics   Alcohol use: No    Alcohol/week: 0.0 standard drinks    Family History :  Reviewed by me    Family History  Problem Relation Age of Onset   Uterine cancer Mother    Heart disease Father 84   Hyperlipidemia Father    Hypertension Father    Ankylosing spondylitis Sister    Heart disease Sister    Hypertension Sister    Stomach cancer Brother 79   Heart attack Brother      Home Medications:   Prior to Admission medications   Medication Sig Start Date End Date Taking? Authorizing Provider  amiodarone (PACERONE) 200 MG tablet Take 400 mg (2 tablets) Monday thru Friday, On Saturday and Sunday take 200 mg (1 tablet) 04/17/21  Yes [name], MD  carvedilol (COREG) 6.25 MG tablet Take 12.5 mg by mouth See admin instructions. Take 1 tablet by mouth in the morning and 1 tablet every evening.   Yes [provider]  clopidogrel (PLAVIX) 75 MG tablet Take 1 tablet (75 mg total) by mouth daily with breakfast. 01/10/21  Yes Robbie Lis M, PA-C  DULoxetine (CYMBALTA) 60 MG capsule Take 60 mg by mouth every evening.   Yes [provider]  empagliflozin (JARDIANCE) 10 MG TABS tablet Take 1 tablet (10 mg total) by mouth daily before breakfast. 01/03/21  Yes [name], MD  EPINEPHrine 0.3 mg/0.3 mL IJ SOAJ injection Inject 0.3 mg into the muscle as needed.   Yes [provider]  ezetimibe (ZETIA) 10 MG tablet TAKE 1 TABLET BY MOUTH EVERY DAY 05/18/21  Yes [name], MD  isosorbide mononitrate (IMDUR) 30 MG 24 hr tablet Take 0.5 tablets (15 mg total) by mouth daily. 03/16/21  Yes Sheilah Pigeon, PA-C  methocarbamol (ROBAXIN) 750 MG tablet Take 750  mg by mouth every 8 (eight) hours as needed for muscle  spasms.   Yes [provider]  mexiletine (MEXITIL) 200 MG capsule Take 1 capsule (200 mg total) by mouth every 8 (eight) hours. 05/07/21  Yes [name], MD  Multiple Vitamins-Minerals (MULTIVITAMINS THER. W/MINERALS) TABS Take 1 tablet by mouth daily.     Yes [provider]  nitroGLYCERIN (NITROSTAT) 0.4 MG SL tablet PLACE 1 TABLET UNDER THE TONGUE EVERY 5 MINUTES FOR 3 DOSES AS NEEDED CHEST PAIN Patient taking differently: Place 0.4 mg under the tongue every 5 (five) minutes as needed. 01/25/21  Yes [name], MD  oxyCODONE-acetaminophen (PERCOCET) 10-325 MG tablet Take 1 tablet by mouth every 4 (four) hours as needed for pain.   Yes [provider]  pantoprazole (PROTONIX) 40 MG tablet Take 1 tablet (40 mg total) by mouth daily. 03/28/21 03/28/22 Yes [name], MD  potassium chloride SA (KLOR-CON) 20 MEQ tablet Take 2 tablets (40 mEq total) by mouth daily. Patient taking differently: Take 20 mEq by mouth See admin instructions. Take 1 tablet in the morning and 1 tablet at lunch 02/12/21  Yes [name], MD  sacubitril-valsartan (ENTRESTO) 24-26 MG Take 1 tablet by mouth 2 (two) times daily. 06/05/21  Yes [name], MD  sennosides-docusate sodium (SENOKOT-S) 8.6-50 MG tablet Take 1 tablet by mouth daily.   Yes [provider]  spironolactone (ALDACTONE) 25 MG tablet Take 1 tablet (25 mg total) by mouth daily. 11/28/20  Yes [name], MD  torsemide (DEMADEX) 20 MG tablet Take 60 mg by mouth daily. 05/28/21  Yes [provider]  traZODone (DESYREL) 100 MG tablet Take 100 mg by mouth at bedtime as needed for sleep.   Yes [provider]  warfarin (COUMADIN) 10 MG tablet TAKE 1/2 TABLET BY MOUTH EVERY DAY EXCEPT 1 TABLET ON SUNDAYS AND THURSDAYS Patient taking differently: Take 5 mg by mouth every evening. 02/12/21  Yes [name], MD  ALPRAZolam  Prudy Feeler) 0.25 MG tablet TAKE 1 TABLET BY MOUTH AT BEDTIME AS NEEDED FOR ANXIETY Patient taking differently: Take 0.25 mg by mouth at bedtime as needed for sleep or anxiety. 06/18/21   [name], MD  fluticasone [facility]) 50 MCG/ACT nasal spray Place 1 spray into both nostrils daily as needed for allergies.  Patient not taking: Reported on 06/23/2021 09/24/19   [provider]  torsemide 60 MG TABS Take 60 mg by mouth daily. Patient not taking: Reported on 06/23/2021 03/16/21   Sheilah Pigeon, PA-C     Allergies:     Allergies  Allergen Reactions   Beef-Derived Products Anaphylaxis    From tick bite   Metoclopramide Hcl Anaphylaxis    Non responsive   Penicillins Anaphylaxis    Pt states she can take any cephalosporin (per pt. 01/01/19) Shock Has patient had a PCN reaction causing immediate rash, facial/tongue/throat swelling, SOB or lightheadedness with hypotension: Yes Has patient had a PCN reaction causing severe rash involving mucus membranes or skin necrosis: No Has patient had a PCN reaction that required hospitalization Yes Has patient had a PCN reaction occurring within the last 10 years: No If all of the above answers are "NO", then may proceed with Cephalosporin    Pork-Derived Products Anaphylaxis    From tick bite. Has tolerated heparin/lovenox   Metoprolol Other (See Comments)    Syncope    Reglan [Metoclopramide]    Statins Other (See Comments)    Myalgias     Physical Exam:   Vitals  Blood  pressure (!) 142/66, pulse (!) 58, resp. rate 19, height 5\' 5"  (1.651 m), weight 87.1 kg, SpO2 100 %.  Physical Examination: General appearance - alert and in no distress  Mental status - alert, oriented to person, place, and time,  Head--multiple scalp hematomas please see photos in epic Eyes - sclera anicteric Neck - supple, no JVD elevation , Chest - clear  to auscultation bilaterally, symmetrical air movement,  Heart - S1 and S2 normal, regular  Abdomen -  soft, nontender, nondistended, no masses or organomegaly Neurological - screening mental status exam normal, neck supple without rigidity, cranial nerves II through XII intact, DTR's normal and symmetric Extremities - no pedal edema noted, intact peripheral pulses  Skin - warm, dry -- Media Information         Document Information  Photos    06/23/2021 21:45  Attached To:  Hospital Encounter on 06/23/21   Source Information  Shon HaleEmokpae, Wendel Homeyer, MD  [facility]   --  Media Information         Document Information  Photos    06/23/2021 21:46  Attached To:  Hospital Encounter on 06/23/21   Source Information  Shon HaleEmokpae, Sadrac Zeoli, MD  [facility]     Data Review:    CBC Recent Labs  Lab 06/23/21 1650  WBC 8.3  HGB 13.9  HCT 44.0  PLT 158  MCV 93.0  MCH 29.4  MCHC 31.6  RDW 14.4  LYMPHSABS 1.3  MONOABS 0.6  EOSABS 0.1  BASOSABS 0.1   ------------------------------------------------------------------------------------------------------------------  Chemistries  Recent Labs  Lab 06/23/21 1650  NA 136  K 3.6  CL 100  CO2 27  GLUCOSE 141*  BUN 24*  CREATININE 1.11*  CALCIUM 9.0  AST 26  ALT 27  ALKPHOS 105  BILITOT 0.4   ------------------------------------------------------------------------------------------------------------------ estimated creatinine clearance is 60.2 mL/min (A) (by C-G formula based on SCr of 1.11 mg/dL (H)). ------------------------------------------------------------------------------------------------------------------ No results for input(s): TSH, T4TOTAL, T3FREE, THYROIDAB in the last 72 hours.  Invalid input(s): FREET3   Coagulation profile Recent Labs  Lab 06/23/21 1650  INR 1.8*   ------------------------------------------------------------------------------------------------------------------- No results for input(s): DDIMER in the last 72  hours. -------------------------------------------------------------------------------------------------------------------  Cardiac Enzymes No results for input(s): CKMB, TROPONINI, MYOGLOBIN in the last 168 hours.  Invalid input(s): CK ------------------------------------------------------------------------------------------------------------------    Component Value Date/Time   BNP 194.0 (H) 03/01/2021 2125     ---------------------------------------------------------------------------------------------------------------  Urinalysis    Component Value Date/Time   COLORURINE YELLOW 12/14/2020 1829   APPEARANCEUR CLEAR 12/14/2020 1829   LABSPEC 1.009 12/14/2020 1829   PHURINE 5.0 12/14/2020 1829   GLUCOSEU 50 (A) 12/14/2020 1829   HGBUR SMALL (A) 12/14/2020 1829   BILIRUBINUR NEGATIVE 12/14/2020 1829   KETONESUR NEGATIVE 12/14/2020 1829   PROTEINUR NEGATIVE 12/14/2020 1829   UROBILINOGEN 0.2 12/23/2011 1440   NITRITE NEGATIVE 12/14/2020 1829   LEUKOCYTESUR NEGATIVE 12/14/2020 1829    ----------------------------------------------------------------------------------------------------------------   Imaging Results:    DG Chest 1 View  Result Date: 06/23/2021 CLINICAL DATA:  Syncope. EXAM: CHEST  1 VIEW COMPARISON:  Chest x-ray dated March 01, 2021. FINDINGS: Unchanged left chest wall pacemaker. Stable mild cardiomegaly. Unchanged linear scarring in the left greater than right lungs. No focal consolidation, pleural effusion, or pneumothorax. No acute osseous abnormality. IMPRESSION: No active disease. Electronically Signed   By: Obie DredgeWilliam T Derry M.D.   On: 06/23/2021 17:36   CT Head Wo Contrast  Result Date: 06/23/2021 CLINICAL DATA:  Syncope, fall down stairs, history of stroke, anticoagulation EXAM: CT  HEAD WITHOUT CONTRAST CT CERVICAL SPINE WITHOUT CONTRAST TECHNIQUE: Multidetector CT imaging of the head and cervical spine was performed following the standard protocol without  intravenous contrast. Multiplanar CT image reconstructions of the cervical spine were also generated. COMPARISON:  None. FINDINGS: CT HEAD FINDINGS Brain: No evidence of acute infarction, hemorrhage, hydrocephalus, extra-axial collection or mass lesion/mass effect. Encephalomalacia of the high right frontal lobe (series 2, image 23). Vascular: No hyperdense vessel or unexpected calcification. Skull: Normal. Negative for fracture or focal lesion. Sinuses/Orbits: No acute finding. Other: Soft tissue hematoma of the left forehead and left occiput (series 2, image 12, 5). CT CERVICAL SPINE FINDINGS Alignment: Normal. Skull base and vertebrae: No acute fracture. No primary bone lesion or focal pathologic process. Soft tissues and spinal canal: No prevertebral fluid or swelling. No visible canal hematoma. Disc levels:  Intact. Upper chest: Negative. Other: None. IMPRESSION: 1. No acute intracranial pathology. 2. Encephalomalacia of the high right frontal lobe, in keeping with prior infarction. 3. Soft tissue hematoma of the left forehead and left occiput. 4. No fracture or static subluxation of the cervical spine. Electronically Signed   By: Lauralyn Primes M.D.   On: 06/23/2021 17:51   CT Cervical Spine Wo Contrast  Result Date: 06/23/2021 CLINICAL DATA:  Syncope, fall down stairs, history of stroke, anticoagulation EXAM: CT HEAD WITHOUT CONTRAST CT CERVICAL SPINE WITHOUT CONTRAST TECHNIQUE: Multidetector CT imaging of the head and cervical spine was performed following the standard protocol without intravenous contrast. Multiplanar CT image reconstructions of the cervical spine were also generated. COMPARISON:  None. FINDINGS: CT HEAD FINDINGS Brain: No evidence of acute infarction, hemorrhage, hydrocephalus, extra-axial collection or mass lesion/mass effect. Encephalomalacia of the high right frontal lobe (series 2, image 23). Vascular: No hyperdense vessel or unexpected calcification. Skull: Normal. Negative for  fracture or focal lesion. Sinuses/Orbits: No acute finding. Other: Soft tissue hematoma of the left forehead and left occiput (series 2, image 12, 5). CT CERVICAL SPINE FINDINGS Alignment: Normal. Skull base and vertebrae: No acute fracture. No primary bone lesion or focal pathologic process. Soft tissues and spinal canal: No prevertebral fluid or swelling. No visible canal hematoma. Disc levels:  Intact. Upper chest: Negative. Other: None. IMPRESSION: 1. No acute intracranial pathology. 2. Encephalomalacia of the high right frontal lobe, in keeping with prior infarction. 3. Soft tissue hematoma of the left forehead and left occiput. 4. No fracture or static subluxation of the cervical spine. Electronically Signed   By: Lauralyn Primes M.D.   On: 06/23/2021 17:51    Radiological Exams on Admission: DG Chest 1 View  Result Date: 06/23/2021 CLINICAL DATA:  Syncope. EXAM: CHEST  1 VIEW COMPARISON:  Chest x-ray dated March 01, 2021. FINDINGS: Unchanged left chest wall pacemaker. Stable mild cardiomegaly. Unchanged linear scarring in the left greater than right lungs. No focal consolidation, pleural effusion, or pneumothorax. No acute osseous abnormality. IMPRESSION: No active disease. Electronically Signed   By: Obie Dredge M.D.   On: 06/23/2021 17:36   CT Head Wo Contrast  Result Date: 06/23/2021 CLINICAL DATA:  Syncope, fall down stairs, history of stroke, anticoagulation EXAM: CT HEAD WITHOUT CONTRAST CT CERVICAL SPINE WITHOUT CONTRAST TECHNIQUE: Multidetector CT imaging of the head and cervical spine was performed following the standard protocol without intravenous contrast. Multiplanar CT image reconstructions of the cervical spine were also generated. COMPARISON:  None. FINDINGS: CT HEAD FINDINGS Brain: No evidence of acute infarction, hemorrhage, hydrocephalus, extra-axial collection or mass lesion/mass effect. Encephalomalacia of the high right frontal  lobe (series 2, image 23). Vascular: No hyperdense  vessel or unexpected calcification. Skull: Normal. Negative for fracture or focal lesion. Sinuses/Orbits: No acute finding. Other: Soft tissue hematoma of the left forehead and left occiput (series 2, image 12, 5). CT CERVICAL SPINE FINDINGS Alignment: Normal. Skull base and vertebrae: No acute fracture. No primary bone lesion or focal pathologic process. Soft tissues and spinal canal: No prevertebral fluid or swelling. No visible canal hematoma. Disc levels:  Intact. Upper chest: Negative. Other: None. IMPRESSION: 1. No acute intracranial pathology. 2. Encephalomalacia of the high right frontal lobe, in keeping with prior infarction. 3. Soft tissue hematoma of the left forehead and left occiput. 4. No fracture or static subluxation of the cervical spine. Electronically Signed   By: Lauralyn Primes M.D.   On: 06/23/2021 17:51   CT Cervical Spine Wo Contrast  Result Date: 06/23/2021 CLINICAL DATA:  Syncope, fall down stairs, history of stroke, anticoagulation EXAM: CT HEAD WITHOUT CONTRAST CT CERVICAL SPINE WITHOUT CONTRAST TECHNIQUE: Multidetector CT imaging of the head and cervical spine was performed following the standard protocol without intravenous contrast. Multiplanar CT image reconstructions of the cervical spine were also generated. COMPARISON:  None. FINDINGS: CT HEAD FINDINGS Brain: No evidence of acute infarction, hemorrhage, hydrocephalus, extra-axial collection or mass lesion/mass effect. Encephalomalacia of the high right frontal lobe (series 2, image 23). Vascular: No hyperdense vessel or unexpected calcification. Skull: Normal. Negative for fracture or focal lesion. Sinuses/Orbits: No acute finding. Other: Soft tissue hematoma of the left forehead and left occiput (series 2, image 12, 5). CT CERVICAL SPINE FINDINGS Alignment: Normal. Skull base and vertebrae: No acute fracture. No primary bone lesion or focal pathologic process. Soft tissues and spinal canal: No prevertebral fluid or swelling. No  visible canal hematoma. Disc levels:  Intact. Upper chest: Negative. Other: None. IMPRESSION: 1. No acute intracranial pathology. 2. Encephalomalacia of the high right frontal lobe, in keeping with prior infarction. 3. Soft tissue hematoma of the left forehead and left occiput. 4. No fracture or static subluxation of the cervical spine. Electronically Signed   By: Lauralyn Primes M.D.   On: 06/23/2021 17:51    DVT Prophylaxis -SCD ///Coumadin currently on hold due to head injury AM Labs Ordered, also please review Full Orders  Family Communication: Admission, patients condition and plan of care including tests being ordered have been discussed with the patient who indicate understanding and agree with the plan   Code Status - Full Code  Likely DC to --transferred to [facility] for possible EP evaluation  Condition   stable  Shon Hale M.D on 06/23/2021 at 10:35 PM Go to [email or web address] -  for contact info  [facility] - Office  [phone]

## 2021-06-23 NOTE — ED Notes (Signed)
Pt reports that nighttime meds that are due are: Enestro 24/26 Mexilitine 200mg  Spirinolactone 25mg  Warfarin 5mg  Carvedilol 12.5mg 

## 2021-06-23 NOTE — ED Triage Notes (Signed)
Pt arrived via [facility] due to fall down stairs with syncopal eposode. Hit forehead and back of head behind left ear with swelling noted. Takes plavix and coumadin

## 2021-06-23 NOTE — ED Notes (Signed)
Notified AC that pts Mexitil is needed from [employer].

## 2021-06-23 NOTE — ED Provider Notes (Signed)
[facility] EMERGENCY DEPARTMENT Provider Note   CSN: [id number] Arrival date & time: 06/23/21  1601     History Chief Complaint  Patient presents with   [name] [name]    Arnetha MassySusan H Facer is a 60 y.o. female.  HPI Patient is a 60 year old female who presents to the emergency department due to a syncopal episode.  She has a complex medical history as noted below.  Patient reports a history of ventricular tachycardia and has an ICD in place.  She states that she is anticoagulated on both Coumadin as well as Plavix.  Patient was sitting at the top of her steps on her screen porch.  She states that she started experiencing palpitations consistent with prior episodes of V. tach, became short of breath, and syncopized falling forwards down 5 steps and hitting pavement.  She has a large hematoma to her left forehead as well as left occiput just behind the ear.  She quickly regained consciousness and states that her shortness of breath resolved.  She denies any chest pain prior, during, or since the event.  Denies any current shortness of breath.  Does not recall her ICD firing.  Patient reports a current headache but otherwise has no complaints.  No chest pain, shortness of breath, abdominal pain, nausea, vomiting, diarrhea, numbness, weakness, arm pain, leg pain.    Past Medical History:  Diagnosis Date   Aneurysm of internal carotid artery    Anti-phospholipid syndrome (HCC)    Anticardiolipin antibody positive    Chronic Coumadin   Brain aneurysm    followed by Dr. Corliss Skainseveshwar   Chronic combined systolic (congestive) and diastolic (congestive) heart failure (HCC)    Cocaine abuse in remission [facility](HCC)    Coronary atherosclerosis of native coronary artery    Previous invasive cardiac [place]  Occluded diagonal, Diffuse LAD disease, Occluded Cx, and non obstructive RCA.    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    Patient Active Problem List   Diagnosis Date Noted   Postmenopausal 05/23/2021   Allergy to alpha-gal 05/23/2021   Insomnia 02/09/2021   Unstable angina (HCC) 01/11/2021   Defibrillator discharge    CAD in native artery    Persistent atrial fibrillation (HCC) 12/15/2020   Prediabetes 12/15/2020   Ventricular tachycardia (HCC) 12/15/2020   Right carotid artery occlusion    GERD (gastroesophageal reflux disease) 08/11/2020   Allergic rhinitis 08/11/2020   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   Tobacco dependence 06/18/2017   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)    Left-sided weakness    Chronic combined systolic (congestive) and diastolic (congestive) heart failure (HCC) 07/26/2015   Encounter for therapeutic drug monitoring 02/02/2014   Dual implantable cardioverter-defibrillator in situ 06/01/2012   Long term current use of anticoagulant 03/22/2011   Essential hypertension, benign 05/29/2010   Hyperlipidemia 09/26/2009   Peripheral vascular disease (HCC) 06/23/2009   Cardiomyopathy, ischemic 11/29/2008   CEREBROVASCULAR DISEASE 11/29/2008   Anticardiolipin syndrome (HCC) 11/29/2008  Past Surgical History:  Procedure Laterality Date   ABDOMINAL AORTAGRAM N/A 05/25/2014   Procedure: ABDOMINAL AORTAGRAM;  Surgeon: Iran Ouch, MD;  Location: [facility] CATH LAB;  Service: Cardiovascular;  Laterality: N/A;   APPENDECTOMY     CARDIAC DEFIBRILLATOR PLACEMENT     [employer] ICD   CARDIOVERSION N/A 12/01/2020   Procedure: CARDIOVERSION;  Surgeon: [name],  MD;  Location: [facility] ENDOSCOPY;  Service: Cardiovascular;  Laterality: N/A;   CORONARY STENT INTERVENTION N/A 01/08/2021   Procedure: CORONARY STENT INTERVENTION;  Surgeon: Swaziland, Peter M, MD;  Location: [facility] INVASIVE CV LAB;  Service: Cardiovascular;  Laterality: N/A;   EP IMPLANTABLE DEVICE N/A 03/01/2016   Procedure: ICD Generator Changeout;  Surgeon: [name], MD;  Location: [facility] INVASIVE CV LAB;  Service: Cardiovascular;  Laterality: N/A;   ICD/BIV ICD FUNCTION(DFT) TEST N/A 03/14/2021   Procedure: ICD/BIV ICD FUNCTION (DFT) TEST;  Surgeon: [name], MD;  Location: [facility] INVASIVE CV LAB;  Service: Cardiovascular;  Laterality: N/A;   L1 corpectomy   12/10   Laryngeal polyp excision     RIGHT/LEFT HEART CATH AND CORONARY ANGIOGRAPHY N/A 12/18/2020   Procedure: RIGHT/LEFT HEART CATH AND CORONARY ANGIOGRAPHY;  Surgeon: Dolores Patty, MD;  Location: [facility] INVASIVE CV LAB;  Service: Cardiovascular;  Laterality: N/A;   RIGHT/LEFT HEART CATH AND CORONARY ANGIOGRAPHY N/A 03/05/2021   Procedure: RIGHT/LEFT HEART CATH AND CORONARY ANGIOGRAPHY;  Surgeon: [name], MD;  Location: [facility] INVASIVE CV LAB;  Service: Cardiovascular;  Laterality: N/A;   TEE WITHOUT CARDIOVERSION N/A 12/01/2020   Procedure: TRANSESOPHAGEAL ECHOCARDIOGRAM (TEE);  Surgeon: [name], MD;  Location: [facility] ENDOSCOPY;  Service: Cardiovascular;  Laterality: N/AFloyce Stakes ABLATION N/A 03/08/2021   Procedure: Floyce Stakes ABLATION;  Surgeon: Lanier Prude, MD;  Location: [facility] INVASIVE CV LAB;  Service: Cardiovascular;  Laterality: N/A;     OB History   No obstetric history on file.     Family History  Problem Relation Age of Onset   Uterine cancer Mother    Heart disease Father 57   Hyperlipidemia Father    Hypertension Father    Ankylosing spondylitis Sister    Heart disease Sister    Hypertension Sister    Stomach cancer Brother 72   Heart attack Brother     Social History   Tobacco Use   Smoking status: Some  Days    Packs/day: 0.25    Years: 43.00    Pack years: 10.75    Types: Cigarettes, E-cigarettes    Start date: 03/03/1978   Smokeless tobacco: Never   Tobacco comments:    4 ciggs per day  Vaping Use   Vaping Use: Every day  Substance Use Topics   Alcohol use: No    Alcohol/week: 0.0 standard drinks   Drug use: No    Comment: Prior history of cocaine    Home Medications Prior to Admission medications   Medication Sig Start Date End Date Taking? Authorizing Provider  ALPRAZolam Prudy Feeler) 0.25 MG tablet TAKE 1 TABLET BY MOUTH AT BEDTIME AS NEEDED FOR ANXIETY 06/18/21   [name], MD  amiodarone (PACERONE) 200 MG tablet Take 400 mg (2 tablets) Monday thru Friday, On Saturday and Sunday take 200 mg (1 tablet) 04/17/21   [name], MD  carvedilol (COREG) 12.5 MG tablet Take 1 tablet (12.5 mg total) by mouth 2 (two) times daily. 05/11/21   [name], MD  clopidogrel (PLAVIX) 75 MG tablet  Take 1 tablet (75 mg total) by mouth daily with breakfast. 01/10/21   Robbie Lis M, PA-C  DULoxetine (CYMBALTA) 60 MG capsule Take 60 mg by mouth every evening.    [provider]  empagliflozin (JARDIANCE) 10 MG TABS tablet Take 1 tablet (10 mg total) by mouth daily before breakfast. 01/03/21   [name], MD  EPINEPHrine 0.3 mg/0.3 mL IJ SOAJ injection Inject 0.3 mg into the muscle as needed.    [provider]  ezetimibe (ZETIA) 10 MG tablet TAKE 1 TABLET BY MOUTH EVERY DAY 05/18/21   [name], MD  fluticasone [facility]) 50 MCG/ACT nasal spray Place 1 spray into both nostrils daily as needed for allergies.  09/24/19   [provider]  isosorbide mononitrate (IMDUR) 30 MG 24 hr tablet Take 0.5 tablets (15 mg total) by mouth daily. 03/16/21   Sheilah Pigeon, PA-C  methocarbamol (ROBAXIN) 750 MG tablet Take 750 mg by mouth every 8 (eight) hours as needed for muscle spasms.    [provider]  mexiletine (MEXITIL) 200 MG capsule Take 1  capsule (200 mg total) by mouth every 8 (eight) hours. 05/07/21   [name], MD  Multiple Vitamins-Minerals (MULTIVITAMINS THER. W/MINERALS) TABS Take 1 tablet by mouth daily.      [provider]  nitroGLYCERIN (NITROSTAT) 0.4 MG SL tablet PLACE 1 TABLET UNDER THE TONGUE EVERY 5 MINUTES FOR 3 DOSES AS NEEDED CHEST PAIN 01/25/21   [name], MD  oxyCODONE-acetaminophen (PERCOCET) 10-325 MG tablet Take 1 tablet by mouth every 4 (four) hours as needed for pain.    [provider]  pantoprazole (PROTONIX) 40 MG tablet Take 1 tablet (40 mg total) by mouth daily. 03/28/21 03/28/22  [name], MD  potassium chloride SA (KLOR-CON) 20 MEQ tablet Take 2 tablets (40 mEq total) by mouth daily. 02/12/21   [name], MD  sacubitril-valsartan (ENTRESTO) 24-26 MG Take 1 tablet by mouth 2 (two) times daily. 06/05/21   [name], MD  spironolactone (ALDACTONE) 25 MG tablet Take 1 tablet (25 mg total) by mouth daily. 11/28/20   [name], MD  torsemide 60 MG TABS Take 60 mg by mouth daily. 03/16/21   Sheilah Pigeon, PA-C  traZODone (DESYREL) 100 MG tablet Take 100 mg by mouth at bedtime as needed for sleep.    [provider]  warfarin (COUMADIN) 10 MG tablet TAKE 1/2 TABLET BY MOUTH EVERY DAY EXCEPT 1 TABLET ON SUNDAYS AND THURSDAYS Patient taking differently: Take 5-10 mg by mouth See admin instructions. 5 mg Monday,Tuesday,Wednesday,Friday,Saturday. 10 mg Sunday and thursday 02/12/21   [name], MD    Allergies    Beef-derived products, Metoclopramide hcl, Penicillins, Pork-derived products, Metoprolol, Reglan [metoclopramide], and Statins  Review of Systems   Review of Systems  All other systems reviewed and are negative. Ten systems reviewed and are negative for acute change, except as noted in the HPI.   Physical Exam Updated Vital Signs BP (!) 142/68   Pulse (!) 58   Resp 17   Ht 5\' 5"  (1.651 m)   Wt 87.1 kg   SpO2 98%    BMI 31.95 kg/m   Physical Exam Vitals and nursing note reviewed.  Constitutional:      General: She is not in acute distress.    Appearance: Normal appearance. She is not ill-appearing, toxic-appearing or diaphoretic.  HENT:     Head: Normocephalic.     Comments: Large  hematoma to the left forehead.  Additional large hematoma just behind the left ear.  No lacerations or active bleeding.    Right Ear: External ear normal.     Left Ear: External ear normal.     Nose: Nose normal.     Mouth/Throat:     Mouth: Mucous membranes are moist.     Pharynx: Oropharynx is clear. No oropharyngeal exudate or posterior oropharyngeal erythema.  Eyes:     General: No scleral icterus.       Right eye: No discharge.        Left eye: No discharge.     Extraocular Movements: Extraocular movements intact.     Conjunctiva/sclera: Conjunctivae normal.     Pupils: Pupils are equal, round, and reactive to light.     Comments: PERRL. EOMI.  Cardiovascular:     Rate and Rhythm: Normal rate and regular rhythm.     Pulses: Normal pulses.     Heart sounds: Normal heart sounds. No murmur heard.   No friction rub. No gallop.     Comments: RRR without M/R/G. Pulmonary:     Effort: Pulmonary effort is normal. No respiratory distress.     Breath sounds: Normal breath sounds. No stridor. No wheezing, rhonchi or rales.  Abdominal:     General: Abdomen is flat.     Palpations: Abdomen is soft.     Tenderness: There is no abdominal tenderness.     Comments: Abdomen is flat, soft, and nontender.  Musculoskeletal:        General: Normal range of motion.     Cervical back: Normal range of motion and neck supple. No tenderness.  Skin:    General: Skin is warm and dry.  Neurological:     General: No focal deficit present.     Mental Status: She is alert and oriented to person, place, and time.     Comments: A&O x3.  Moving all 4 extremities with ease.  No gross deficits.  Psychiatric:        Mood and Affect:  Mood normal.        Behavior: Behavior normal.    ED Results / Procedures / Treatments   Labs (all labs ordered are listed, but only abnormal results are displayed) Labs Reviewed  PROTIME-INR - Abnormal; Notable for the following components:      Result Value   Prothrombin Time 21.0 (*)    INR 1.8 (*)    All other components within normal limits  COMPREHENSIVE METABOLIC PANEL - Abnormal; Notable for the following components:   Glucose, Bld 141 (*)    BUN 24 (*)    Creatinine, Ser 1.11 (*)    GFR, Estimated 58 (*)    All other components within normal limits  TROPONIN I (HIGH SENSITIVITY) - Abnormal; Notable for the following components:   Troponin I (High Sensitivity) 22 (*)    All other components within normal limits  TROPONIN I (HIGH SENSITIVITY) - Abnormal; Notable for the following components:   Troponin I (High Sensitivity) 36 (*)    All other components within normal limits  RESP PANEL BY RT-PCR (FLU A&B, COVID) [id number]  CBC WITH DIFFERENTIAL/PLATELET  CBG MONITORING, ED   EKG EKG Interpretation  Date/Time:  Saturday June 23 2021 16:15:02 EDT Ventricular Rate:  71 PR Interval:  200 QRS Duration: 114 QT Interval:  419 QTC Calculation: 456 R Axis:   60 Text Interpretation: Sinus rhythm Borderline intraventricular conduction delay Low voltage, extremity leads Minimal ST  elevation, anterior leads No acute changes No significant change since last tracing Confirmed by Derwood Kaplan [phone]) on 06/23/2021 6:44:43 PM  Radiology DG Chest 1 View  Result Date: 06/23/2021 CLINICAL DATA:  Syncope. EXAM: CHEST  1 VIEW COMPARISON:  Chest x-ray dated March 01, 2021. FINDINGS: Unchanged left chest wall pacemaker. Stable mild cardiomegaly. Unchanged linear scarring in the left greater than right lungs. No focal consolidation, pleural effusion, or pneumothorax. No acute osseous abnormality. IMPRESSION: No active disease. Electronically Signed   By: Obie Dredge M.D.   On: 06/23/2021  17:36   CT Head Wo Contrast  Result Date: 06/23/2021 CLINICAL DATA:  Syncope, fall down stairs, history of stroke, anticoagulation EXAM: CT HEAD WITHOUT CONTRAST CT CERVICAL SPINE WITHOUT CONTRAST TECHNIQUE: Multidetector CT imaging of the head and cervical spine was performed following the standard protocol without intravenous contrast. Multiplanar CT image reconstructions of the cervical spine were also generated. COMPARISON:  None. FINDINGS: CT HEAD FINDINGS Brain: No evidence of acute infarction, hemorrhage, hydrocephalus, extra-axial collection or mass lesion/mass effect. Encephalomalacia of the high right frontal lobe (series 2, image 23). Vascular: No hyperdense vessel or unexpected calcification. Skull: Normal. Negative for fracture or focal lesion. Sinuses/Orbits: No acute finding. Other: Soft tissue hematoma of the left forehead and left occiput (series 2, image 12, 5). CT CERVICAL SPINE FINDINGS Alignment: Normal. Skull base and vertebrae: No acute fracture. No primary bone lesion or focal pathologic process. Soft tissues and spinal canal: No prevertebral fluid or swelling. No visible canal hematoma. Disc levels:  Intact. Upper chest: Negative. Other: None. IMPRESSION: 1. No acute intracranial pathology. 2. Encephalomalacia of the high right frontal lobe, in keeping with prior infarction. 3. Soft tissue hematoma of the left forehead and left occiput. 4. No fracture or static subluxation of the cervical spine. Electronically Signed   By: Lauralyn Primes M.D.   On: 06/23/2021 17:51   CT Cervical Spine Wo Contrast  Result Date: 06/23/2021 CLINICAL DATA:  Syncope, fall down stairs, history of stroke, anticoagulation EXAM: CT HEAD WITHOUT CONTRAST CT CERVICAL SPINE WITHOUT CONTRAST TECHNIQUE: Multidetector CT imaging of the head and cervical spine was performed following the standard protocol without intravenous contrast. Multiplanar CT image reconstructions of the cervical spine were also generated.  COMPARISON:  None. FINDINGS: CT HEAD FINDINGS Brain: No evidence of acute infarction, hemorrhage, hydrocephalus, extra-axial collection or mass lesion/mass effect. Encephalomalacia of the high right frontal lobe (series 2, image 23). Vascular: No hyperdense vessel or unexpected calcification. Skull: Normal. Negative for fracture or focal lesion. Sinuses/Orbits: No acute finding. Other: Soft tissue hematoma of the left forehead and left occiput (series 2, image 12, 5). CT CERVICAL SPINE FINDINGS Alignment: Normal. Skull base and vertebrae: No acute fracture. No primary bone lesion or focal pathologic process. Soft tissues and spinal canal: No prevertebral fluid or swelling. No visible canal hematoma. Disc levels:  Intact. Upper chest: Negative. Other: None. IMPRESSION: 1. No acute intracranial pathology. 2. Encephalomalacia of the high right frontal lobe, in keeping with prior infarction. 3. Soft tissue hematoma of the left forehead and left occiput. 4. No fracture or static subluxation of the cervical spine. Electronically Signed   By: Lauralyn Primes M.D.   On: 06/23/2021 17:51    Procedures Procedures   Medications Ordered in ED Medications  oxyCODONE-acetaminophen (PERCOCET/ROXICET) 5-325 MG per tablet 1 tablet (1 tablet Oral Given 06/23/21 1811)  oxyCODONE-acetaminophen (PERCOCET/ROXICET) 5-325 MG per tablet 1 tablet (1 tablet Oral Given 06/23/21 2109)    ED Course  I  have reviewed the triage vital signs and the nursing notes.  Pertinent labs & imaging results that were available during my care of the patient were reviewed by me and considered in my medical decision making (see chart for details).  Clinical Course as of 06/23/21 2122  Sat Jun 23, 2021  2113 Pacemaker was interrogated and showed multiple episodes of V. fib and V. tach.  She was shocked once during this episode. [LJ]  2114 Patient discussed with cardiology they recommend admission to [facility] for overnight observation.  If she has  an additional episode of V. fib or V. tach she should then be transferred to [facility].  Otherwise, discharge tomorrow if stable and have her follow-up with cardiology on Monday. [LJ]    Clinical Course User Index [LJ] Placido Sou, PA-C   MDM Rules/Calculators/A&P                          Pt is a 60 y.o. female who presents to the emergency department due to an episode of syncope.  Labs: CBC without abnormalities. CMP with a glucose of 141, BUN of 24, creatinine of 1.11, GFR 58. Prothrombin time of 21 with an INR of 1.8.  Patient anticoagulated on Coumadin. Troponin of 22 with a repeat of 36. Respiratory panel is pending.  Imaging: CT scan of the head and neck without contrast shows no acute intracranial pathology.  Encephalomalacia of the right high frontal lobe in keeping with prior infarction.  Soft tissue hematoma of the left forehead and left occiput.  No fracture or static subluxation of the cervical spine.  I, Placido Sou, PA-C, personally reviewed and evaluated these images and lab results as part of my medical decision-making.  Patient had an episode of cardiogenic syncope earlier today.  After having her ICD interrogated and showed multiple episodes of V. fib as well as V. tach.  She was shocked once.  Patient discussed with cardiology and they recommend admission for overnight observation.  They state that she can be admitted to [facility] but if she has any further episodes overnight she will need to be transferred to [facility] for further evaluation.  Otherwise, patient can be discharged tomorrow if in stable condition and can follow-up with cardiology first thing on Monday morning.  This was discussed with the patient and she is amenable.  We will discussed with the medicine team.  Note: Portions of this report may have been transcribed using voice recognition software. Every effort was made to ensure accuracy; however, inadvertent computerized transcription errors  may be present.    Final Clinical Impression(s) / ED Diagnoses Final diagnoses:  Syncope, cardiogenic    Rx / DC Orders ED Discharge Orders     None        Placido Sou, Cordelia Poche 06/23/21 2123    Derwood Kaplan, MD 06/24/21 1921

## 2021-06-24 ENCOUNTER — Encounter (HOSPITAL_COMMUNITY): Payer: Self-pay | Admitting: Family Medicine

## 2021-06-24 DIAGNOSIS — Z83438 Family history of other disorder of lipoprotein metabolism and other lipidemia: Secondary | ICD-10-CM | POA: Diagnosis not present

## 2021-06-24 DIAGNOSIS — S0003XA Contusion of scalp, initial encounter: Secondary | ICD-10-CM | POA: Diagnosis present

## 2021-06-24 DIAGNOSIS — Z20822 Contact with and (suspected) exposure to covid-19: Secondary | ICD-10-CM | POA: Diagnosis present

## 2021-06-24 DIAGNOSIS — Z9049 Acquired absence of other specified parts of digestive tract: Secondary | ICD-10-CM | POA: Diagnosis not present

## 2021-06-24 DIAGNOSIS — F419 Anxiety disorder, unspecified: Secondary | ICD-10-CM | POA: Diagnosis present

## 2021-06-24 DIAGNOSIS — I255 Ischemic cardiomyopathy: Secondary | ICD-10-CM | POA: Diagnosis present

## 2021-06-24 DIAGNOSIS — Z7902 Long term (current) use of antithrombotics/antiplatelets: Secondary | ICD-10-CM | POA: Diagnosis not present

## 2021-06-24 DIAGNOSIS — R55 Syncope and collapse: Secondary | ICD-10-CM

## 2021-06-24 DIAGNOSIS — Z7901 Long term (current) use of anticoagulants: Secondary | ICD-10-CM

## 2021-06-24 DIAGNOSIS — I5042 Chronic combined systolic (congestive) and diastolic (congestive) heart failure: Secondary | ICD-10-CM | POA: Diagnosis present

## 2021-06-24 DIAGNOSIS — Z8249 Family history of ischemic heart disease and other diseases of the circulatory system: Secondary | ICD-10-CM | POA: Diagnosis not present

## 2021-06-24 DIAGNOSIS — I110 Hypertensive heart disease with heart failure: Secondary | ICD-10-CM | POA: Diagnosis present

## 2021-06-24 DIAGNOSIS — I472 Ventricular tachycardia: Secondary | ICD-10-CM | POA: Diagnosis present

## 2021-06-24 DIAGNOSIS — W19XXXA Unspecified fall, initial encounter: Secondary | ICD-10-CM | POA: Diagnosis present

## 2021-06-24 DIAGNOSIS — D6861 Antiphospholipid syndrome: Secondary | ICD-10-CM | POA: Diagnosis present

## 2021-06-24 DIAGNOSIS — F17210 Nicotine dependence, cigarettes, uncomplicated: Secondary | ICD-10-CM | POA: Diagnosis present

## 2021-06-24 DIAGNOSIS — Z79899 Other long term (current) drug therapy: Secondary | ICD-10-CM | POA: Diagnosis not present

## 2021-06-24 DIAGNOSIS — I4901 Ventricular fibrillation: Secondary | ICD-10-CM | POA: Diagnosis present

## 2021-06-24 DIAGNOSIS — I4819 Other persistent atrial fibrillation: Secondary | ICD-10-CM | POA: Diagnosis present

## 2021-06-24 DIAGNOSIS — Z7984 Long term (current) use of oral hypoglycemic drugs: Secondary | ICD-10-CM | POA: Diagnosis not present

## 2021-06-24 DIAGNOSIS — Z95810 Presence of automatic (implantable) cardiac defibrillator: Secondary | ICD-10-CM | POA: Diagnosis not present

## 2021-06-24 DIAGNOSIS — F17200 Nicotine dependence, unspecified, uncomplicated: Secondary | ICD-10-CM

## 2021-06-24 DIAGNOSIS — I2510 Atherosclerotic heart disease of native coronary artery without angina pectoris: Secondary | ICD-10-CM | POA: Diagnosis present

## 2021-06-24 DIAGNOSIS — Z8673 Personal history of transient ischemic attack (TIA), and cerebral infarction without residual deficits: Secondary | ICD-10-CM | POA: Diagnosis not present

## 2021-06-24 DIAGNOSIS — E782 Mixed hyperlipidemia: Secondary | ICD-10-CM | POA: Diagnosis present

## 2021-06-24 DIAGNOSIS — K219 Gastro-esophageal reflux disease without esophagitis: Secondary | ICD-10-CM | POA: Diagnosis present

## 2021-06-24 LAB — BASIC METABOLIC PANEL
Anion gap: 6 (ref 5–15)
BUN: 16 mg/dL (ref 6–20)
CO2: 27 mmol/L (ref 22–32)
Calcium: 8.7 mg/dL — ABNORMAL LOW (ref 8.9–10.3)
Chloride: 105 mmol/L (ref 98–111)
Creatinine, Ser: 0.81 mg/dL (ref 0.44–1.00)
GFR, Estimated: >60 mL/min (ref >60)
Glucose, Bld: 119 mg/dL — ABNORMAL HIGH (ref 70–99)
Potassium: 3.9 mmol/L (ref 3.5–5.1)
Sodium: 138 mmol/L (ref 135–145)

## 2021-06-24 LAB — PROTIME-INR
INR: 1.8 — ABNORMAL HIGH (ref 0.8–1.2)
INR: 1.7 — ABNORMAL HIGH (ref 0.8–1.2)
Prothrombin Time: 21 seconds — ABNORMAL HIGH (ref 11.4–15.2)
Prothrombin Time: 20.3 seconds — ABNORMAL HIGH (ref 11.4–15.2)

## 2021-06-24 LAB — CBC
HCT: 37.4 % (ref 36.0–46.0)
Hemoglobin: 12.1 g/dL (ref 12.0–15.0)
MCH: 30 pg (ref 26.0–34.0)
MCHC: 32.4 g/dL (ref 30.0–36.0)
MCV: 92.6 fL (ref 80.0–100.0)
Platelets: 129 10*3/uL — ABNORMAL LOW (ref 150–400)
RBC: 4.04 MIL/uL (ref 3.87–5.11)
RDW: 14.3 % (ref 11.5–15.5)
WBC: 7.3 10*3/uL (ref 4.0–10.5)
nRBC: 0 % (ref 0.0–0.2)

## 2021-06-24 LAB — TROPONIN I (HIGH SENSITIVITY)
Troponin I (High Sensitivity): 22 ng/L — ABNORMAL HIGH (ref <18)
Troponin I (High Sensitivity): 21 ng/L — ABNORMAL HIGH (ref <18)

## 2021-06-24 MED ORDER — WARFARIN - PHARMACIST DOSING INPATIENT: Freq: Every day | Status: DC

## 2021-06-24 MED ORDER — AMIODARONE HCL 200 MG PO TABS
200.0000 mg | ORAL_TABLET | ORAL | Status: DC
  Administered 2021-06-24: 200 mg via ORAL
  Filled 2021-06-24 (×3): qty 1

## 2021-06-24 MED ORDER — WARFARIN SODIUM 3 MG PO TABS
6.0000 mg | ORAL_TABLET | Freq: Once | ORAL | Status: AC
  Administered 2021-06-24: 6 mg via ORAL
  Filled 2021-06-24: qty 2

## 2021-06-24 MED ORDER — ALPRAZOLAM 0.25 MG PO TABS
0.2500 mg | ORAL_TABLET | Freq: Three times a day (TID) | ORAL | Status: DC | PRN
  Administered 2021-06-24 – 2021-06-26 (×7): 0.25 mg via ORAL
  Filled 2021-06-24 (×7): qty 1

## 2021-06-24 NOTE — ED Notes (Signed)
Called [id number] to give report, primary nurse and charge nurse are busy and unable to take report. Will attempt to call in 10 minutes,

## 2021-06-24 NOTE — Progress Notes (Signed)
PROGRESS NOTE    [name] [name]  DPO:[id number] DOB: 15-Mar-1961 DOA: 06/23/2021 PCP: [name] Fudge, FNP   Brief Narrative:  HPI On 06/23/2021 by Dr. Shon Hale Zenith [name]  is a 61 y.o. female smoker with past medical history relevant for recurrent V. tach/V. fib, persistent A. fib status post prior TEE with cardioversion, combined systolic and diastolic dysfunction CHF with last known EF of 25 to 30% in the setting of ischemic cardiomyopathy, CAD with left circumflex stent in January 2022, antiphospholipid syndrome requiring chronic anticoagulation with Coumadin who presents to the ED after episode of syncope and loss of consciousness with closed head injury and scalp hematomas -AICD/Pacemaker was interrogated and showed multiple episodes of V. fib and V. tach.  She was shocked once during this episode -Creatinine is 1.1, potassium is 3.6,, magnesium TSH and phosphorus are pending -CT head and CT C-spine without new acute findings at this time INR is 1.8, CBC WNL   -- Denies chest pains or palpitations at this time, no increased shortness of breath -No fever  Or chills   No Nausea, Vomiting or Diarrhea  Interim history Admitted for syncope, VT/VF. Cardiology consulted and started on IV amio.  Assessment & Plan   Syncope -Likely cardiogenic in etiology -Patient presented after a fall with closed head injury and frontal scalp/orbital hematoma -AICD/pacemaker interrogated at [facility] and showed multiple episodes of V. fib and V. tach with 1 shock -Patient was transferred to [facility] -Cardiology consulted and appreciated -PT and OT consulted -  V. fib/V. fib tach -Cardiology consulted and appreciated -Patient follows up with [name] [name]-currently on mexiletine and amiodarone -Patient has been started on amiodarone drip  Closed head injury -As above, secondary to fall -Patient with L frontal scalp hematoma as well as lateral neck -CT head and neck without acute  findings -Continue to monitor  History of persistent atrial fibrillation -Patient status post TEE with cardioversion December 2021, currently on Coumadin therapy and antiarrhythmic-amiodarone, mexiletine, Coreg  Chronic combined systolic and diastolic heart failure/ischemic cardiomyopathy -Currently appears to be euvolemic and stable -Patient follows with [name] [name] -Echocardiogram 12/01/2020 showed an EF of 25 to 30%, with regional wall motion abnormalities with severe hypokinesis to akinesis of the anterior and septal walls -Continue Demadex, spironolactone, Entresto -Continue to monitor daily weights, intake and output  CAD -Stable, patient currently chest pain-free -Status post LHC with proximal left circumflex stent placed in January 2022 -Currently on Plavix, Imdur -Patient is intolerant to statins, on Zetia  Tobacco abuse -Discussed tobacco cessation  Anticardiolipin syndrome/hypercoagulable state -Patient is on Coumadin -Coumadin was held given hematoma  DVT Prophylaxis SCDs  Code Status: Full  Family Communication: None at bedside  Disposition Plan:  Status is: Observation  The patient will require care spanning > 2 midnights and should be moved to inpatient because: IV treatments appropriate due to intensity of illness or inability to take PO and Inpatient level of care appropriate due to severity of illness  Dispo: The patient is from: Home              Anticipated d/c is to: Home              Patient currently is not medically stable to d/c.   Difficult to place patient No   Consultants Cardiology  Procedures  None  Antibiotics   Anti-infectives (From admission, onward)    None       Subjective:   [name] [name] seen and examined today.  Has no complaints of chest pain or shortness of breath at this time.  Denies current abdominal pain, nausea or vomiting, diarrhea constipation, dizziness or headache.  Feels very anxious today.  Objective:    Vitals:   06/24/21 0121 06/24/21 0223 06/24/21 0400 06/24/21 0700  BP: (!) 112/43  (!) 92/44 (!) 110/56  Pulse: (!) 53  (!) 54 61  Resp: 20   20  Temp: 98.7 F (37.1 C)  98.6 F (37 C) 97.8 F (36.6 C)  TempSrc: Oral  Oral Oral  SpO2: 96%  97% 96%  Weight: 89.4 kg 89.4 kg    Height: 5\' 5"  (1.651 m)       Intake/Output Summary (Last 24 hours) at 06/24/2021 1136 Last data filed at 06/24/2021 0735 Gross per 24 hour  Intake 240 ml  Output --  Net 240 ml   Filed Weights   06/23/21 1616 06/24/21 0121 06/24/21 0223  Weight: 87.1 kg 89.4 kg 89.4 kg    Exam General: Well developed, well nourished, NAD, appears stated age HEENT: NCAT, left frontal scalp hematoma with orbital hematoma and swelling/ecchymosis, mucous membranes moist.  Neck: Supple, no JVD, no masses, left posterior lateral ecchymosis Cardiovascular: S1 S2 auscultated, RRR Respiratory: Clear to auscultation bilaterally with equal chest rise Abdomen: Soft, nontender, nondistended, + bowel sounds Extremities: warm dry without cyanosis clubbing or edema Neuro: AAOx3, cranial nerves grossly intact. Strength 5/5 in patient's upper and lower extremities bilaterally Psych: tearful, anxious however appropriate   Data Reviewed: I have personally reviewed following labs and imaging studies  CBC: Recent Labs  Lab 06/23/21 1650  WBC 8.3  NEUTROABS 6.3  HGB 13.9  HCT 44.0  MCV 93.0  PLT 158   Basic Metabolic Panel: Recent Labs  Lab 06/23/21 1650 06/23/21 1856  NA 136  --   K 3.6  --   CL 100  --   CO2 27  --   GLUCOSE 141*  --   BUN 24*  --   CREATININE 1.11*  --   CALCIUM 9.0  --   MG  --  2.3  PHOS  --  5.3*   GFR: Estimated Creatinine Clearance: 61 mL/min (A) (by C-G formula based on SCr of 1.11 mg/dL (H)). Liver Function Tests: Recent Labs  Lab 06/23/21 1650  AST 26  ALT 27  ALKPHOS 105  BILITOT 0.4  PROT 7.4  ALBUMIN 3.8   No results for input(s): LIPASE, AMYLASE in the last 168  hours. No results for input(s): AMMONIA in the last 168 hours. Coagulation Profile: Recent Labs  Lab 06/23/21 1650  INR 1.8*   Cardiac Enzymes: No results for input(s): CKTOTAL, CKMB, CKMBINDEX, TROPONINI in the last 168 hours. BNP (last 3 results) No results for input(s): PROBNP in the last 8760 hours. HbA1C: No results for input(s): HGBA1C in the last 72 hours. CBG: No results for input(s): GLUCAP in the last 168 hours. Lipid Profile: No results for input(s): CHOL, HDL, LDLCALC, TRIG, CHOLHDL, LDLDIRECT in the last 72 hours. Thyroid Function Tests: Recent Labs    06/23/21 1640  TSH 3.401   Anemia Panel: No results for input(s): VITAMINB12, FOLATE, FERRITIN, TIBC, IRON, RETICCTPCT in the last 72 hours. Urine analysis:    Component Value Date/Time   COLORURINE YELLOW 12/14/2020 1829   APPEARANCEUR CLEAR 12/14/2020 1829   LABSPEC 1.009 12/14/2020 1829   PHURINE 5.0 12/14/2020 1829   GLUCOSEU 50 (A) 12/14/2020 1829   HGBUR SMALL (A) 12/14/2020 1829   BILIRUBINUR NEGATIVE 12/14/2020 1829  KETONESUR NEGATIVE 12/14/2020 1829   PROTEINUR NEGATIVE 12/14/2020 1829   UROBILINOGEN 0.2 12/23/2011 1440   NITRITE NEGATIVE 12/14/2020 1829   LEUKOCYTESUR NEGATIVE 12/14/2020 1829   Sepsis Labs: @LABRCNTIP (procalcitonin:4,lacticidven:4)  ) Recent Results (from the past 240 hour(s))  Resp Panel by RT-PCR (Flu A&B, Covid) Nasopharyngeal Swab     Status: None   Collection Time: 06/23/21  9:40 PM   Specimen: Nasopharyngeal Swab; Nasopharyngeal(NP) swabs in vial transport medium  Result Value Ref Range Status   SARS Coronavirus 2 by RT PCR NEGATIVE NEGATIVE Final    Comment: (NOTE) SARS-CoV-2 target nucleic acids are NOT DETECTED.  The SARS-CoV-2 RNA is generally detectable in upper respiratory specimens during the acute phase of infection. The lowest concentration of SARS-CoV-2 viral copies this assay can detect is 138 copies/mL. A negative result does not preclude  SARS-Cov-2 infection and should not be used as the sole basis for treatment or other patient management decisions. A negative result may occur with  improper specimen collection/handling, submission of specimen other than nasopharyngeal swab, presence of viral mutation(s) within the areas targeted by this assay, and inadequate number of viral copies(<138 copies/mL). A negative result must be combined with clinical observations, patient history, and epidemiological information. The expected result is Negative.  Fact Sheet for Patients:  06/25/21  Fact Sheet for Healthcare Providers:  BloggerCourse.com  This test is no t yet approved or cleared by the [employer] FDA and  has been authorized for detection and/or diagnosis of SARS-CoV-2 by FDA under an Emergency Use Authorization (EUA). This EUA will remain  in effect (meaning this test can be used) for the duration of the COVID-19 declaration under Section 564(b)(1) of the Act, 21 U.S.C.section 360bbb-3(b)(1), unless the authorization is terminated  or revoked sooner.       Influenza A by PCR NEGATIVE NEGATIVE Final   Influenza B by PCR NEGATIVE NEGATIVE Final    Comment: (NOTE) The Xpert Xpress SARS-CoV-2/FLU/RSV plus assay is intended as an aid in the diagnosis of influenza from Nasopharyngeal swab specimens and should not be used as a sole basis for treatment. Nasal washings and aspirates are unacceptable for Xpert Xpress SARS-CoV-2/FLU/RSV testing.  Fact Sheet for Patients: [place]  Fact Sheet for Healthcare Providers: BloggerCourse.com  This test is not yet approved or cleared by the [employer] FDA and has been authorized for detection and/or diagnosis of SARS-CoV-2 by FDA under an Emergency Use Authorization (EUA). This EUA will remain in effect (meaning this test can be used) for the duration of  the COVID-19 declaration under Section 564(b)(1) of the Act, 21 U.S.C. section 360bbb-3(b)(1), unless the authorization is terminated or revoked.  Performed at [facility], [address], [place], [place]       Radiology Studies: DG Chest 1 View  Result Date: 06/23/2021 CLINICAL DATA:  Syncope. EXAM: CHEST  1 VIEW COMPARISON:  Chest x-ray dated March 01, 2021. FINDINGS: Unchanged left chest wall pacemaker. Stable mild cardiomegaly. Unchanged linear scarring in the left greater than right lungs. No focal consolidation, pleural effusion, or pneumothorax. No acute osseous abnormality. IMPRESSION: No active disease. Electronically Signed   By: March 03, 2021 M.D.   On: 06/23/2021 17:36   CT Head Wo Contrast  Result Date: 06/23/2021 CLINICAL DATA:  Syncope, fall down stairs, history of stroke, anticoagulation EXAM: CT HEAD WITHOUT CONTRAST CT CERVICAL SPINE WITHOUT CONTRAST TECHNIQUE: Multidetector CT imaging of the head and cervical spine was performed following the standard protocol without intravenous contrast. Multiplanar CT image  reconstructions of the cervical spine were also generated. COMPARISON:  None. FINDINGS: CT HEAD FINDINGS Brain: No evidence of acute infarction, hemorrhage, hydrocephalus, extra-axial collection or mass lesion/mass effect. Encephalomalacia of the high right frontal lobe (series 2, image 23). Vascular: No hyperdense vessel or unexpected calcification. Skull: Normal. Negative for fracture or focal lesion. Sinuses/Orbits: No acute finding. Other: Soft tissue hematoma of the left forehead and left occiput (series 2, image 12, 5). CT CERVICAL SPINE FINDINGS Alignment: Normal. Skull base and vertebrae: No acute fracture. No primary bone lesion or focal pathologic process. Soft tissues and spinal canal: No prevertebral fluid or swelling. No visible canal hematoma. Disc levels:  Intact. Upper chest: Negative. Other: None. IMPRESSION: 1. No acute intracranial pathology.  2. Encephalomalacia of the high right frontal lobe, in keeping with prior infarction. 3. Soft tissue hematoma of the left forehead and left occiput. 4. No fracture or static subluxation of the cervical spine. Electronically Signed   By: Lauralyn PrimesAlex  Bibbey M.D.   On: 06/23/2021 17:51   CT Cervical Spine Wo Contrast  Result Date: 06/23/2021 CLINICAL DATA:  Syncope, fall down stairs, history of stroke, anticoagulation EXAM: CT HEAD WITHOUT CONTRAST CT CERVICAL SPINE WITHOUT CONTRAST TECHNIQUE: Multidetector CT imaging of the head and cervical spine was performed following the standard protocol without intravenous contrast. Multiplanar CT image reconstructions of the cervical spine were also generated. COMPARISON:  None. FINDINGS: CT HEAD FINDINGS Brain: No evidence of acute infarction, hemorrhage, hydrocephalus, extra-axial collection or mass lesion/mass effect. Encephalomalacia of the high right frontal lobe (series 2, image 23). Vascular: No hyperdense vessel or unexpected calcification. Skull: Normal. Negative for fracture or focal lesion. Sinuses/Orbits: No acute finding. Other: Soft tissue hematoma of the left forehead and left occiput (series 2, image 12, 5). CT CERVICAL SPINE FINDINGS Alignment: Normal. Skull base and vertebrae: No acute fracture. No primary bone lesion or focal pathologic process. Soft tissues and spinal canal: No prevertebral fluid or swelling. No visible canal hematoma. Disc levels:  Intact. Upper chest: Negative. Other: None. IMPRESSION: 1. No acute intracranial pathology. 2. Encephalomalacia of the high right frontal lobe, in keeping with prior infarction. 3. Soft tissue hematoma of the left forehead and left occiput. 4. No fracture or static subluxation of the cervical spine. Electronically Signed   By: Lauralyn PrimesAlex  Bibbey M.D.   On: 06/23/2021 17:51     Scheduled Meds:  amiodarone  200 mg Oral Once per day on Sun Sat   [START ON 06/25/2021] amiodarone  400 mg Oral Once per day on Mon Tue Wed  Thu Fri   carvedilol  12.5 mg Oral BID WC   clopidogrel  75 mg Oral Q breakfast   DULoxetine  60 mg Oral QHS   ezetimibe  10 mg Oral Daily   isosorbide mononitrate  15 mg Oral Daily   mexiletine  200 mg Oral Once   mexiletine  200 mg Oral Q8H   multivitamin with minerals  1 tablet Oral Daily   pantoprazole  40 mg Oral Daily   potassium chloride SA  20 mEq Oral BID WC   potassium chloride  40 mEq Oral Q3H   sacubitril-valsartan  1 tablet Oral BID   senna-docusate  1 tablet Oral Daily   sodium chloride flush  3 mL Intravenous Q12H   sodium chloride flush  3 mL Intravenous Q12H   spironolactone  25 mg Oral QPM   torsemide  60 mg Oral Daily   Continuous Infusions:  sodium chloride  LOS: 0 days   Time Spent in minutes   45 minutes  Rainey Rodger D.O. on 06/24/2021 at 11:36 AM  Between 7am to 7pm - Please see pager noted on [email or web address]  After 7pm go to [email or web address]  And look for the night coverage person covering for me after hours  [facility] Office  [phone]

## 2021-06-24 NOTE — ED Notes (Signed)
Assisted pt to bathroom, gait steady but slow due to left foot pain.

## 2021-06-24 NOTE — Consult Note (Addendum)
Cardiology Consultation:   Patient ID: [name] [name] MRN: [id number]; DOB: [birth year]  Admit date: 06/23/2021 Date of Consult: 06/24/2021  PCP:  [name], FNP   [facility] Providers Cardiologist:  Nona Dell, MD  Electrophysiologist:  Lewayne Bunting, MD       Patient Profile:   [name] [name] is a 60 y.o. 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  PVD, statin intolerance, CVA with total occlusion of RICA, and brain aneurysm who is being seen 06/24/2021 for the evaluation of syncope at the request of Dr. Catha Gosselin.  History of Present Illness:   [name] [name] with above hx and last EF 25-30%, mildly reduced RV contraction, chronic systolic CHF and has been seen by AHF Dr. Shirlee Latch, she has [employer] ICD followed by Dr. Ladona Ridgel.   Last cath 01/08/21 with prox Cx to mLCX 95% stenosis and DES stent was placed, on plavix and coumadin but no ASA.  Other vessels with 1st diag stenosis, mLAD 45% stenoses.    She had VT ablation 03/08/21 with VT storm by Dr. Lalla Brothers.  Last ICD interrogation 06/01/21 with leads and battery stable and histograms appropriate. + tobacco.   She now presents (to [facility] 06/23/21) with fall down the stairs with syncopal episode. She was sitting at top of steps and experienced palpitations with prior episodes of V tach, became SOB and went out falling forwards down 5 steps and hitting pavement.   She quickly regained consciousness and SOB resolved.  No chest pain or SOB.  She has a hematoma on Lt forehead and Left occipital area.  She did not feel device go off.  On interrogation she had multiple episodes of V Fib and V tach.  Shocked once by device.   Transported to [facility] for admit.  At one point pt was taking 400 of amiodarone - she was given 400 mg and not aware then ran out but now back on 200  Na 136, K+ 3.6  glucose 141, Cr 1.11 Mg2.3 LFTs WNL  Hgb 13.9 Plts 158 Wbc 8.3 INR 1.8  TSH 3.401   Finally she notes that she had angina the day before and had to take a sublingual ntg.   CXR stable and NAD  CT head  IMPRESSION: 1. No acute intracranial pathology. 2. Encephalomalacia of the high right frontal lobe, in keeping with prior infarction. 3. Soft tissue hematoma of the left forehead and left occiput. 4. No fracture or static subluxation of the cervical spine.  EKG:  The EKG was personally reviewed and demonstrates:  from 06/23/21 SR with LBBB and low voltage no acute changes Telemetry:  Telemetry was personally reviewed and demonstrates:  SR  BP 110/56 P 55 R 20 and afebrile.    Past Medical History:  Diagnosis Date   Aneurysm of internal carotid artery    Anti-phospholipid syndrome (HCC)    Anticardiolipin antibody positive    Chronic Coumadin   Brain aneurysm    followed by Dr. Corliss Skains   Chronic combined systolic (congestive) and diastolic (congestive) heart failure (HCC)    Cocaine abuse in remission [facility])    Coronary atherosclerosis of native coronary artery    Previous invasive cardiac testing was done at a facility in [place], [place].  Occluded diagonal, Diffuse LAD disease, Occluded Cx, and non obstructive RCA.    Essential hypertension  Headache    History of pneumonia    ICD (implantable cardioverter-defibrillator) in place    Ischemic cardiomyopathy    LVEF 30-35%   Mixed hyperlipidemia    NSVT (nonsustained ventricular tachycardia) (HCC)    Persistent atrial fibrillation (HCC)    PVD (peripheral vascular disease) (HCC)    Raynaud's phenomenon    Statin intolerance    Stroke [facility]) 2007   Total occlusion of the right internal carotid    Past Surgical History:  Procedure Laterality Date   ABDOMINAL AORTAGRAM N/A 05/25/2014   Procedure: ABDOMINAL Ronny Flurry;  Surgeon: Iran Ouch, MD;  Location: [facility] CATH LAB;  Service: Cardiovascular;  Laterality: N/A;    APPENDECTOMY     CARDIAC DEFIBRILLATOR PLACEMENT     [employer] ICD   CARDIOVERSION N/A 12/01/2020   Procedure: CARDIOVERSION;  Surgeon: [name], MD;  Location: [facility] ENDOSCOPY;  Service: Cardiovascular;  Laterality: N/A;   CORONARY STENT INTERVENTION N/A 01/08/2021   Procedure: CORONARY STENT INTERVENTION;  Surgeon: Swaziland, Peter M, MD;  Location: [facility] INVASIVE CV LAB;  Service: Cardiovascular;  Laterality: N/A;   EP IMPLANTABLE DEVICE N/A 03/01/2016   Procedure: ICD Generator Changeout;  Surgeon: [name], MD;  Location: [facility] INVASIVE CV LAB;  Service: Cardiovascular;  Laterality: N/A;   ICD/BIV ICD FUNCTION(DFT) TEST N/A 03/14/2021   Procedure: ICD/BIV ICD FUNCTION (DFT) TEST;  Surgeon: [name], MD;  Location: [facility] INVASIVE CV LAB;  Service: Cardiovascular;  Laterality: N/A;   L1 corpectomy   12/10   Laryngeal polyp excision     RIGHT/LEFT HEART CATH AND CORONARY ANGIOGRAPHY N/A 12/18/2020   Procedure: RIGHT/LEFT HEART CATH AND CORONARY ANGIOGRAPHY;  Surgeon: Dolores Patty, MD;  Location: [facility] INVASIVE CV LAB;  Service: Cardiovascular;  Laterality: N/A;   RIGHT/LEFT HEART CATH AND CORONARY ANGIOGRAPHY N/A 03/05/2021   Procedure: RIGHT/LEFT HEART CATH AND CORONARY ANGIOGRAPHY;  Surgeon: [name], MD;  Location: [facility] INVASIVE CV LAB;  Service: Cardiovascular;  Laterality: N/A;   TEE WITHOUT CARDIOVERSION N/A 12/01/2020   Procedure: TRANSESOPHAGEAL ECHOCARDIOGRAM (TEE);  Surgeon: [name], MD;  Location: [facility] ENDOSCOPY;  Service: Cardiovascular;  Laterality: N/AFloyce Stakes ABLATION N/A 03/08/2021   Procedure: Floyce Stakes ABLATION;  Surgeon: Lanier Prude, MD;  Location: [facility] INVASIVE CV LAB;  Service: Cardiovascular;  Laterality: N/A;     Home Medications:  Prior to Admission medications   Medication Sig Start Date End Date Taking? Authorizing Provider  amiodarone (PACERONE) 200 MG tablet Take 400 mg (2 tablets) Monday thru Friday, On Saturday and Sunday take 200 mg (1  tablet) 04/17/21  Yes [name], MD  carvedilol (COREG) 6.25 MG tablet Take 12.5 mg by mouth See admin instructions. Take 1 tablet by mouth in the morning and 1 tablet every evening.   Yes [provider]  clopidogrel (PLAVIX) 75 MG tablet Take 1 tablet (75 mg total) by mouth daily with breakfast. 01/10/21  Yes Robbie Lis M, PA-C  DULoxetine (CYMBALTA) 60 MG capsule Take 60 mg by mouth every evening.   Yes [provider]  empagliflozin (JARDIANCE) 10 MG TABS tablet Take 1 tablet (10 mg total) by mouth daily before breakfast. 01/03/21  Yes [name], MD  EPINEPHrine 0.3 mg/0.3 mL IJ SOAJ injection Inject 0.3 mg into the muscle as needed.   Yes [provider]  ezetimibe (ZETIA) 10 MG tablet TAKE 1 TABLET BY MOUTH EVERY DAY 05/18/21  Yes [name], MD  isosorbide mononitrate (  IMDUR) 30 MG 24 hr tablet Take 0.5 tablets (15 mg total) by mouth daily. 03/16/21  Yes Sheilah Pigeon, PA-C  methocarbamol (ROBAXIN) 750 MG tablet Take 750 mg by mouth every 8 (eight) hours as needed for muscle spasms.   Yes [provider]  mexiletine (MEXITIL) 200 MG capsule Take 1 capsule (200 mg total) by mouth every 8 (eight) hours. 05/07/21  Yes [name], MD  Multiple Vitamins-Minerals (MULTIVITAMINS THER. W/MINERALS) TABS Take 1 tablet by mouth daily.     Yes [provider]  nitroGLYCERIN (NITROSTAT) 0.4 MG SL tablet PLACE 1 TABLET UNDER THE TONGUE EVERY 5 MINUTES FOR 3 DOSES AS NEEDED CHEST PAIN Patient taking differently: Place 0.4 mg under the tongue every 5 (five) minutes as needed. 01/25/21  Yes [name], MD  oxyCODONE-acetaminophen (PERCOCET) 10-325 MG tablet Take 1 tablet by mouth every 4 (four) hours as needed for pain.   Yes [provider]  pantoprazole (PROTONIX) 40 MG tablet Take 1 tablet (40 mg total) by mouth daily. 03/28/21 03/28/22 Yes [name], MD  potassium chloride SA (KLOR-CON) 20 MEQ tablet Take 2  tablets (40 mEq total) by mouth daily. Patient taking differently: Take 20 mEq by mouth See admin instructions. Take 1 tablet in the morning and 1 tablet at lunch 02/12/21  Yes [name], MD  sacubitril-valsartan (ENTRESTO) 24-26 MG Take 1 tablet by mouth 2 (two) times daily. 06/05/21  Yes [name], MD  sennosides-docusate sodium (SENOKOT-S) 8.6-50 MG tablet Take 1 tablet by mouth daily.   Yes [provider]  spironolactone (ALDACTONE) 25 MG tablet Take 1 tablet (25 mg total) by mouth daily. 11/28/20  Yes [name], MD  torsemide (DEMADEX) 20 MG tablet Take 60 mg by mouth daily. 05/28/21  Yes [provider]  traZODone (DESYREL) 100 MG tablet Take 100 mg by mouth at bedtime as needed for sleep.   Yes [provider]  warfarin (COUMADIN) 10 MG tablet TAKE 1/2 TABLET BY MOUTH EVERY DAY EXCEPT 1 TABLET ON SUNDAYS AND THURSDAYS Patient taking differently: Take 5 mg by mouth every evening. 02/12/21  Yes [name], MD  ALPRAZolam Prudy Feeler) 0.25 MG tablet TAKE 1 TABLET BY MOUTH AT BEDTIME AS NEEDED FOR ANXIETY Patient taking differently: Take 0.25 mg by mouth at bedtime as needed for sleep or anxiety. 06/18/21   [name], MD  fluticasone [facility]) 50 MCG/ACT nasal spray Place 1 spray into both nostrils daily as needed for allergies.  Patient not taking: Reported on 06/23/2021 09/24/19   [provider]  torsemide 60 MG TABS Take 60 mg by mouth daily. Patient not taking: Reported on 06/23/2021 03/16/21   Sheilah Pigeon, PA-C    Inpatient Medications: Scheduled Meds:  amiodarone  200 mg Oral Once per day on Sun Sat   [START ON 06/25/2021] amiodarone  400 mg Oral Once per day on Mon Tue Wed Thu Fri   carvedilol  12.5 mg Oral BID WC   clopidogrel  75 mg Oral Q breakfast   DULoxetine  60 mg Oral QHS   ezetimibe  10 mg Oral Daily   isosorbide mononitrate  15 mg Oral Daily   mexiletine  200 mg Oral Once   mexiletine  200 mg Oral Q8H    multivitamin with minerals  1 tablet Oral Daily   pantoprazole  40 mg Oral Daily   potassium chloride SA  20 mEq Oral BID WC   potassium chloride  40 mEq Oral Q3H   sacubitril-valsartan  1 tablet Oral BID   senna-docusate  1 tablet Oral Daily   sodium chloride flush  3 mL Intravenous Q12H   sodium chloride flush  3 mL Intravenous Q12H   spironolactone  25 mg Oral QPM   torsemide  60 mg Oral Daily   Continuous Infusions:  sodium chloride     PRN Meds: sodium chloride, acetaminophen **OR** acetaminophen, ALPRAZolam, bisacodyl, methocarbamol, nitroGLYCERIN, ondansetron **OR** ondansetron (ZOFRAN) IV, oxyCODONE-acetaminophen **AND** oxyCODONE, polyethylene glycol, sodium chloride flush, traZODone  Allergies:    Allergies  Allergen Reactions   Beef-Derived Products Anaphylaxis    From tick bite   Metoclopramide Hcl Anaphylaxis    Non responsive   Penicillins Anaphylaxis    Pt states she can take any cephalosporin (per pt. 01/01/19) Shock Has patient had a PCN reaction causing immediate rash, facial/tongue/throat swelling, SOB or lightheadedness with hypotension: Yes Has patient had a PCN reaction causing severe rash involving mucus membranes or skin necrosis: No Has patient had a PCN reaction that required hospitalization Yes Has patient had a PCN reaction occurring within the last 10 years: No If all of the above answers are "NO", then may proceed with Cephalosporin    Pork-Derived Products Anaphylaxis    From tick bite. Has tolerated heparin/lovenox   Metoprolol Other (See Comments)    Syncope    Reglan [Metoclopramide]    Statins Other (See Comments)    Myalgias    Social History:   Social History   Socioeconomic History   Marital status: Single    Spouse name: Not on file   Number of children: Not on file   Years of education: Not on file   Highest education level: Not on file  Occupational History   Occupation: Disabled    Comment: ESL  Tobacco Use   Smoking  status: Some Days    Packs/day: 0.25    Years: 43.00    Pack years: 10.75    Types: Cigarettes, E-cigarettes    Start date: 03/03/1978   Smokeless tobacco: Never   Tobacco comments:    4 ciggs per day  Vaping Use   Vaping Use: Every day  Substance and Sexual Activity   Alcohol use: No    Alcohol/week: 0.0 standard drinks   Drug use: No    Comment: Prior history of cocaine   Sexual activity: Not Currently    Birth control/protection: None  Other Topics Concern   Not on file  Social History Narrative   Not on file   Social Determinants of Health   Financial Resource Strain: Low Risk    Difficulty of Paying Living Expenses: Not very hard  Food Insecurity: No Food Insecurity   Worried About Programme researcher, broadcasting/film/video in the Last Year: Never true   Ran Out of Food in the Last Year: Never true  Transportation Needs: No Transportation Needs   Lack of Transportation (Medical): No   Lack of Transportation (Non-Medical): No  Physical Activity: Not on file  Stress: Not on file  Social Connections: Not on file  Intimate Partner Violence: Not on file    Family History:    Family History  Problem Relation Age of Onset   Uterine cancer Mother    Heart disease Father 52   Hyperlipidemia Father    Hypertension Father    Ankylosing spondylitis Sister    Heart disease Sister    Hypertension Sister    Stomach cancer Brother 68   Heart attack  Brother      ROS:  Please see the history of present illness.  General:no colds or fevers, no weight changes Skin:no rashes or ulcers HEENT:no blurred vision, no congestion, + hematoma CV:see HPI PUL:see HPI GI:no diarrhea constipation or melena, no indigestion GU:no hematuria, no dysuria MS:no joint pain, no claudication Neuro:no syncope, no lightheadedness Endo:no diabetes, no thyroid disease  All other ROS reviewed and negative.     Physical Exam/Data:   Vitals:   06/24/21 0121 06/24/21 0223 06/24/21 0400 06/24/21 0700  BP: (!)  112/43  (!) 92/44 (!) 110/56  Pulse: (!) 53  (!) 54 61  Resp: 20   20  Temp: 98.7 F (37.1 C)  98.6 F (37 C) 97.8 F (36.6 C)  TempSrc: Oral  Oral Oral  SpO2: 96%  97% 96%  Weight: 89.4 kg 89.4 kg    Height: 5\' 5"  (1.651 m)       Intake/Output Summary (Last 24 hours) at 06/24/2021 1003 Last data filed at 06/24/2021 0735 Gross per 24 hour  Intake 240 ml  Output --  Net 240 ml   Last 3 Weights 06/24/2021 06/24/2021 06/23/2021  Weight (lbs) 197 lb 1.6 oz 197 lb 1.6 oz 192 lb  Weight (kg) 89.404 kg 89.404 kg 87.091 kg     Body mass index is 32.8 kg/m.   Exam per Dr. Ladona Ridgel   Relevant CV Studies: Rt and Lt cath 12/18/20 and stent 01/08/21  1st Diag lesion is 100% stenosed. Mid LAD lesion is 45% stenosed. Prox Cx to Mid Cx lesion is 95% stenosed. Prox RCA lesion is 30% stenosed.   Findings:   Ao = 125/65 (88) LV = 127/13 RA =  1 RV = 27/4 PA = 24/6 (14) PCW = 12 Fick cardiac output/index = 6.2/3.0 PVR = 0.3 WU Ao sat = 99% PA sat = 76%, 77% High SVC sat = 80%   Assessment: 1. 2v CAD with 95% mid LCX and chronically occluded D1 2. EF 25% 3. Well compensated hemodynamics with high cardiac output. 4. No evidence of intracardiac shunting   Plan/Discussion:   Cath results very similar to cath report from 2008 though LCx is 95% stenosed (not 100% as previously reported). I reviewed cath with Dr. Excell Seltzer. Lcx is amenable to PCI but with dropping platelets and absolute need for warfarin and ASA with anti-phospholipid syndrome and recent DC-CV will defer PCI for now particularly in light of normal hstrop. Can reconsider in January or February when farther out from [name] if platelets rebounded.   PCI 01/08/21  Prox Cx to Mid Cx lesion is 95% stenosed. Post intervention, there is a 0% residual stenosis. A drug-eluting stent was successfully placed using a STENT RESOLUTE ONYX 2.25X22.   1. Successful PCI of the LCx with DES x 1   Plan: recommend Plavix and Coumadin. Would  not Discharge on ASA due to increased bleeding risk. Will resume IV angiomax until coumadin resumed and therapeutic.   Carotid dopplers  03/05/21  Summary:  Right Carotid: Evidence consistent with a total occlusion of the right  ICA.   Left Carotid: Velocities in the left ICA are consistent with a 1-39%  stenosis.   Vertebrals:  Bilateral vertebral arteries demonstrate antegrade flow.  Subclavians: Normal flow hemodynamics were seen in bilateral subclavian               arteries.    Laboratory Data:  High Sensitivity Troponin:   Recent Labs  Lab 06/23/21 1650 06/23/21 1856  TROPONINIHS  22* 36*     Chemistry Recent Labs  Lab 06/23/21 1650  NA 136  K 3.6  CL 100  CO2 27  GLUCOSE 141*  BUN 24*  CREATININE 1.11*  CALCIUM 9.0  GFRNONAA 58*  ANIONGAP 9    Recent Labs  Lab 06/23/21 1650  PROT 7.4  ALBUMIN 3.8  AST 26  ALT 27  ALKPHOS 105  BILITOT 0.4   Hematology Recent Labs  Lab 06/23/21 1650  WBC 8.3  RBC 4.73  HGB 13.9  HCT 44.0  MCV 93.0  MCH 29.4  MCHC 31.6  RDW 14.4  PLT 158   BNPNo results for input(s): BNP, PROBNP in the last 168 hours.  DDimer No results for input(s): DDIMER in the last 168 hours.   Radiology/Studies:  DG Chest 1 View  Result Date: 06/23/2021 CLINICAL DATA:  Syncope. EXAM: CHEST  1 VIEW COMPARISON:  Chest x-ray dated March 01, 2021. FINDINGS: Unchanged left chest wall pacemaker. Stable mild cardiomegaly. Unchanged linear scarring in the left greater than right lungs. No focal consolidation, pleural effusion, or pneumothorax. No acute osseous abnormality. IMPRESSION: No active disease. Electronically Signed   By: Obie Dredge M.D.   On: 06/23/2021 17:36   CT Head Wo Contrast  Result Date: 06/23/2021 CLINICAL DATA:  Syncope, fall down stairs, history of stroke, anticoagulation EXAM: CT HEAD WITHOUT CONTRAST CT CERVICAL SPINE WITHOUT CONTRAST TECHNIQUE: Multidetector CT imaging of the head and cervical spine was performed  following the standard protocol without intravenous contrast. Multiplanar CT image reconstructions of the cervical spine were also generated. COMPARISON:  None. FINDINGS: CT HEAD FINDINGS Brain: No evidence of acute infarction, hemorrhage, hydrocephalus, extra-axial collection or mass lesion/mass effect. Encephalomalacia of the high right frontal lobe (series 2, image 23). Vascular: No hyperdense vessel or unexpected calcification. Skull: Normal. Negative for fracture or focal lesion. Sinuses/Orbits: No acute finding. Other: Soft tissue hematoma of the left forehead and left occiput (series 2, image 12, 5). CT CERVICAL SPINE FINDINGS Alignment: Normal. Skull base and vertebrae: No acute fracture. No primary bone lesion or focal pathologic process. Soft tissues and spinal canal: No prevertebral fluid or swelling. No visible canal hematoma. Disc levels:  Intact. Upper chest: Negative. Other: None. IMPRESSION: 1. No acute intracranial pathology. 2. Encephalomalacia of the high right frontal lobe, in keeping with prior infarction. 3. Soft tissue hematoma of the left forehead and left occiput. 4. No fracture or static subluxation of the cervical spine. Electronically Signed   By: Lauralyn Primes M.D.   On: 06/23/2021 17:51   CT Cervical Spine Wo Contrast  Result Date: 06/23/2021 CLINICAL DATA:  Syncope, fall down stairs, history of stroke, anticoagulation EXAM: CT HEAD WITHOUT CONTRAST CT CERVICAL SPINE WITHOUT CONTRAST TECHNIQUE: Multidetector CT imaging of the head and cervical spine was performed following the standard protocol without intravenous contrast. Multiplanar CT image reconstructions of the cervical spine were also generated. COMPARISON:  None. FINDINGS: CT HEAD FINDINGS Brain: No evidence of acute infarction, hemorrhage, hydrocephalus, extra-axial collection or mass lesion/mass effect. Encephalomalacia of the high right frontal lobe (series 2, image 23). Vascular: No hyperdense vessel or unexpected  calcification. Skull: Normal. Negative for fracture or focal lesion. Sinuses/Orbits: No acute finding. Other: Soft tissue hematoma of the left forehead and left occiput (series 2, image 12, 5). CT CERVICAL SPINE FINDINGS Alignment: Normal. Skull base and vertebrae: No acute fracture. No primary bone lesion or focal pathologic process. Soft tissues and spinal canal: No prevertebral fluid or swelling. No visible canal hematoma.  Disc levels:  Intact. Upper chest: Negative. Other: None. IMPRESSION: 1. No acute intracranial pathology. 2. Encephalomalacia of the high right frontal lobe, in keeping with prior infarction. 3. Soft tissue hematoma of the left forehead and left occiput. 4. No fracture or static subluxation of the cervical spine. Electronically Signed   By: Lauralyn PrimesAlex  Bibbey M.D.   On: 06/23/2021 17:51     Assessment and Plan:   Vtach/Vfib with ICD shock - we will obtain the device interrogation. By report multiple episodes of VT and VF. I will have her start IV amiodarone Syncope due to above - she has a significant hematoma over the left eye and back of her head. She has soft tissue swelling on the CT but no intracranial bleeding Angina requiring sublingual NTG with no troponin elevation despite ICD shocks - As her troponins are negative and she is not having active pain, I am not inclined to have her cath repeated unless troponin go back up or she develops more angina.   Risk Assessment/Risk Scores:   For questions or updates, please contact [facility] Please consult [email or web address] for contact info under    Signed, Dorathy DaftGregg Emalyn Schou,MD

## 2021-06-24 NOTE — Progress Notes (Signed)
ANTICOAGULATION CONSULT NOTE - Initial Consult  Pharmacy Consult for Warfarin Indication: atrial fibrillation and anticardiolipin syndrome  Allergies  Allergen Reactions   Beef-Derived Products Anaphylaxis    From tick bite   Metoclopramide Hcl Anaphylaxis    Non responsive   Penicillins Anaphylaxis    Pt states she can take any cephalosporin (per pt. 01/01/19) Shock Has patient had a PCN reaction causing immediate rash, facial/tongue/throat swelling, SOB or lightheadedness with hypotension: Yes Has patient had a PCN reaction causing severe rash involving mucus membranes or skin necrosis: No Has patient had a PCN reaction that required hospitalization Yes Has patient had a PCN reaction occurring within the last 10 years: No If all of the above answers are "NO", then may proceed with Cephalosporin    Pork-Derived Products Anaphylaxis    From tick bite. Has tolerated heparin/lovenox   Metoprolol Other (See Comments)    Syncope    Reglan [Metoclopramide]    Statins Other (See Comments)    Myalgias    Patient Measurements: Height: 5\' 5"  (165.1 cm) Weight: 89.4 kg (197 lb 1.6 oz) IBW/kg (Calculated) : 57  Vital Signs: Temp: 97.8 F (36.6 C) (06/26 0700) Temp Source: Oral (06/26 1300) BP: 91/45 (06/26 1300) Pulse Rate: 58 (06/26 1300)  Labs: Recent Labs    06/23/21 1650 06/23/21 1856 06/24/21 1342  HGB 13.9  --   --   HCT 44.0  --   --   PLT 158  --   --   LABPROT 21.0*  --  20.3*  INR 1.8*  --  1.7*  CREATININE 1.11*  --   --   TROPONINIHS 22* 36*  --     Estimated Creatinine Clearance: 61 mL/min (A) (by C-G formula based on SCr of 1.11 mg/dL (H)).   Medical History: Past Medical History:  Diagnosis Date   Aneurysm of internal carotid artery    Anti-phospholipid syndrome (HCC)    Anticardiolipin antibody positive    Chronic Coumadin   Brain aneurysm    followed by Dr. 06/26/21   Chronic combined systolic (congestive) and diastolic (congestive) heart  failure (HCC)    Cocaine abuse in remission [facility])    Coronary atherosclerosis of native coronary artery    Previous invasive cardiac testing was done at a facility in [place], [place].  Occluded diagonal, Diffuse LAD disease, Occluded Cx, and non obstructive RCA.    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 Take 1 tablet by mouth in the morning and 1 tablet every evening.   06/23/2021 at 0600   clopidogrel (PLAVIX) 75 MG tablet Take 1 tablet (75 mg total) by mouth daily with breakfast. 30 tablet 11 06/23/2021 at 0600   DULoxetine (CYMBALTA) 60 MG  capsule Take 60 mg by mouth every evening.   06/22/2021   empagliflozin (JARDIANCE) 10 MG TABS tablet Take 1 tablet (10 mg total) by mouth daily before breakfast. 30 tablet 3 06/23/2021   EPINEPHrine 0.3 mg/0.3 mL IJ SOAJ injection Inject 0.3 mg into the muscle as needed.      ezetimibe (ZETIA) 10 MG tablet TAKE 1 TABLET BY MOUTH EVERY DAY 30 tablet 5 06/23/2021   isosorbide mononitrate (IMDUR) 30 MG 24 hr tablet Take 0.5 tablets (15 mg total) by mouth daily. 15 tablet 6 06/23/2021   methocarbamol (ROBAXIN) 750 MG tablet Take 750 mg by mouth every 8 (eight) hours as needed for muscle spasms.   Past Month   mexiletine (MEXITIL) 200 MG capsule Take  1 capsule (200 mg total) by mouth every 8 (eight) hours. 90 capsule 3 06/23/2021   Multiple Vitamins-Minerals (MULTIVITAMINS THER. W/MINERALS) TABS Take 1 tablet by mouth daily.     06/22/2021   nitroGLYCERIN (NITROSTAT) 0.4 MG SL tablet PLACE 1 TABLET UNDER THE TONGUE EVERY 5 MINUTES FOR 3 DOSES AS NEEDED CHEST PAIN (Patient taking differently: Place 0.4 mg under the tongue every 5 (five) minutes as needed.) 25 tablet 3 06/22/2021   oxyCODONE-acetaminophen (PERCOCET) 10-325 MG tablet Take 1 tablet by mouth every 4 (four) hours as needed for pain.   06/23/2021   pantoprazole (PROTONIX) 40 MG tablet Take 1 tablet (40 mg total) by mouth daily. 30 tablet 11 06/23/2021   potassium chloride SA (KLOR-CON) 20 MEQ tablet Take 2 tablets (40 mEq total) by mouth daily. (Patient taking differently: Take 20 mEq by mouth See admin instructions. Take 1 tablet in the morning and 1 tablet at lunch) 180 tablet 1 06/23/2021   sacubitril-valsartan (ENTRESTO) 24-26 MG Take 1 tablet by mouth 2 (two) times daily. 180 tablet 3 06/23/2021   sennosides-docusate sodium (SENOKOT-S) 8.6-50 MG tablet Take 1 tablet by mouth daily.   06/22/2021   spironolactone (ALDACTONE) 25 MG tablet Take 1 tablet (25 mg total) by mouth daily. 30 tablet 3 06/22/2021   torsemide (DEMADEX) 20 MG tablet Take 60 mg by mouth daily.   06/23/2021   traZODone (DESYREL) 100 MG tablet Take 100 mg by mouth at bedtime as needed for sleep.   Past Week   warfarin (COUMADIN) 10 MG tablet TAKE 1/2 TABLET BY MOUTH EVERY DAY EXCEPT 1 TABLET ON SUNDAYS AND THURSDAYS (Patient taking differently: Take 5 mg by mouth every evening.) 30 tablet 3 06/22/2021 at 1900   ALPRAZolam (XANAX) 0.25 MG tablet TAKE 1 TABLET BY MOUTH AT BEDTIME AS NEEDED FOR ANXIETY (Patient taking differently: Take 0.25 mg by mouth at bedtime as needed for sleep or anxiety.) 30 tablet 0    fluticasone (FLONASE) 50 MCG/ACT nasal spray Place 1 spray into both nostrils daily as needed for allergies.  (Patient not  taking: Reported on 06/23/2021)   Not Taking   torsemide 60 MG TABS Take 60 mg by mouth daily. (Patient not taking: Reported on 06/23/2021) 30 tablet 6 Not Taking    Assessment: [name] presented to ED 6/25 after episode of syncope and LOC where she rolled down 5 stairs.  Pt on Warfarin PTA and had a closed head injury and scalp hematomas.  Warfarin dose was held 6/25 PM.  PTA warfarin dose = 5mg  daily.  INR today subtherapeutic at 1.7  Goal of Therapy:  INR 2-3 Monitor platelets by anticoagulation protocol: Yes   Plan:  Warfarin 6 mg PO x 1 tonight. Daily INR  [employer], Pharm.D., BCPS Clinical Pharmacist Clinical phone for 06/24/2021 from 7:30-3:00 is [phone].  **Pharmacist phone directory can be found on [email or web address] listed under [facility].  06/24/2021 3:01 PM

## 2021-06-25 ENCOUNTER — Other Ambulatory Visit (HOSPITAL_COMMUNITY): Payer: Self-pay | Admitting: Cardiology

## 2021-06-25 DIAGNOSIS — I6521 Occlusion and stenosis of right carotid artery: Secondary | ICD-10-CM

## 2021-06-25 LAB — CBC
HCT: 36 % (ref 36.0–46.0)
Hemoglobin: 11.7 g/dL — ABNORMAL LOW (ref 12.0–15.0)
MCH: 29.8 pg (ref 26.0–34.0)
MCHC: 32.5 g/dL (ref 30.0–36.0)
MCV: 91.8 fL (ref 80.0–100.0)
Platelets: 133 10*3/uL — ABNORMAL LOW (ref 150–400)
RBC: 3.92 MIL/uL (ref 3.87–5.11)
RDW: 14.2 % (ref 11.5–15.5)
WBC: 7.3 10*3/uL (ref 4.0–10.5)
nRBC: 0 % (ref 0.0–0.2)

## 2021-06-25 LAB — BASIC METABOLIC PANEL
Anion gap: 6 (ref 5–15)
BUN: 17 mg/dL (ref 6–20)
CO2: 29 mmol/L (ref 22–32)
Calcium: 8.6 mg/dL — ABNORMAL LOW (ref 8.9–10.3)
Chloride: 100 mmol/L (ref 98–111)
Creatinine, Ser: 0.99 mg/dL (ref 0.44–1.00)
GFR, Estimated: >60 mL/min (ref >60)
Glucose, Bld: 83 mg/dL (ref 70–99)
Potassium: 4.4 mmol/L (ref 3.5–5.1)
Sodium: 135 mmol/L (ref 135–145)

## 2021-06-25 LAB — PROTIME-INR
INR: 1.7 — ABNORMAL HIGH (ref 0.8–1.2)
Prothrombin Time: 19.8 seconds — ABNORMAL HIGH (ref 11.4–15.2)

## 2021-06-25 MED ORDER — WARFARIN SODIUM 3 MG PO TABS
6.0000 mg | ORAL_TABLET | Freq: Once | ORAL | Status: AC
  Administered 2021-06-25: 6 mg via ORAL
  Filled 2021-06-25: qty 2

## 2021-06-25 MED ORDER — MAGNESIUM HYDROXIDE 400 MG/5ML PO SUSP: 10.0000 mL | Freq: Every day | ORAL | Status: DC | PRN

## 2021-06-25 NOTE — Progress Notes (Signed)
TRH night shift.  The patient requested this something different then Dulcolax suppository for constipation.  She is also on MiraLAX as needed daily.  MOM 10 mL p.o. daily ordered.  Sanda Klein, MD.

## 2021-06-25 NOTE — Progress Notes (Addendum)
Electrophysiology Rounding Note  Patient Name: [name] [name] Date of Encounter: 06/25/2021  Primary Cardiologist: Nona Dell, MD Electrophysiologist: Lewayne Bunting, MD   Subjective   The patient is doing well today.  At this time, the patient denies chest pain, shortness of breath, or any new concerns.  Inpatient Medications    Scheduled Meds:  amiodarone  200 mg Oral Once per day on Sun Sat   amiodarone  400 mg Oral Once per day on Mon Tue Wed Thu Fri   carvedilol  12.5 mg Oral BID WC   clopidogrel  75 mg Oral Q breakfast   DULoxetine  60 mg Oral QHS   ezetimibe  10 mg Oral Daily   isosorbide mononitrate  15 mg Oral Daily   mexiletine  200 mg Oral Once   mexiletine  200 mg Oral Q8H   multivitamin with minerals  1 tablet Oral Daily   pantoprazole  40 mg Oral Daily   potassium chloride SA  20 mEq Oral BID WC   sacubitril-valsartan  1 tablet Oral BID   senna-docusate  1 tablet Oral Daily   sodium chloride flush  3 mL Intravenous Q12H   sodium chloride flush  3 mL Intravenous Q12H   spironolactone  25 mg Oral QPM   torsemide  60 mg Oral Daily   Warfarin - Pharmacist Dosing Inpatient   Does not apply q1600   Continuous Infusions:  sodium chloride     PRN Meds: sodium chloride, acetaminophen **OR** acetaminophen, ALPRAZolam, bisacodyl, methocarbamol, nitroGLYCERIN, ondansetron **OR** ondansetron (ZOFRAN) IV, oxyCODONE-acetaminophen **AND** oxyCODONE, polyethylene glycol, sodium chloride flush, traZODone   Vital Signs    Vitals:   06/24/21 1615 06/24/21 2000 06/25/21 0447 06/25/21 0452  BP: (!) 92/54 (!) 104/47  121/64  Pulse: 61 66  (!) 55  Resp: 17 16  20   Temp: 97.7 F (36.5 C) 97.8 F (36.6 C)  98 F (36.7 C)  TempSrc: Oral   Oral  SpO2: 97% 96%  100%  Weight:   89.9 kg   Height:        Intake/Output Summary (Last 24 hours) at 06/25/2021 0721 Last data filed at 06/25/2021 0447 Gross per 24 hour  Intake 960 ml  Output 1600 ml  Net -640 ml   Filed  Weights   06/24/21 0121 06/24/21 0223 06/25/21 0447  Weight: 89.4 kg 89.4 kg 89.9 kg    Physical Exam    GEN- The patient is well appearing, alert and oriented x 3 today.   Head- normocephalic, atraumatic Eyes-  Sclera clear, conjunctiva pink Ears- hearing intact Oropharynx- clear Neck- supple Lungs- Clear to ausculation bilaterally, normal work of breathing Heart- Regular rate and rhythm, no murmurs, rubs or gallops GI- soft, NT, ND, + BS Extremities- no clubbing or cyanosis. No edema Skin- no rash or lesion Psych- euthymic mood, full affect Neuro- strength and sensation are intact  Labs    CBC Recent Labs    06/23/21 1650 06/25/21 0423  WBC 8.3 7.3  NEUTROABS 6.3  --   HGB 13.9 11.7*  HCT 44.0 36.0  MCV 93.0 91.8  PLT 158 133*   Basic Metabolic Panel Recent Labs    06/27/21 1650 06/23/21 1856 06/25/21 0423  NA 136  --  135  K 3.6  --  4.4  CL 100  --  100  CO2 27  --  29  GLUCOSE 141*  --  83  BUN 24*  --  17  CREATININE 1.11*  --  0.99  CALCIUM 9.0  --  8.6*  MG  --  2.3  --   PHOS  --  5.3*  --    Liver Function Tests Recent Labs    06/23/21 1650  AST 26  ALT 27  ALKPHOS 105  BILITOT 0.4  PROT 7.4  ALBUMIN 3.8   No results for input(s): LIPASE, AMYLASE in the last 72 hours. Cardiac Enzymes No results for input(s): CKTOTAL, CKMB, CKMBINDEX, TROPONINI in the last 72 hours.   Telemetry    Sinus bradycardia 50s (personally reviewed)  Radiology    DG Chest 1 View  Result Date: 06/23/2021 CLINICAL DATA:  Syncope. EXAM: CHEST  1 VIEW COMPARISON:  Chest x-ray dated March 01, 2021. FINDINGS: Unchanged left chest wall pacemaker. Stable mild cardiomegaly. Unchanged linear scarring in the left greater than right lungs. No focal consolidation, pleural effusion, or pneumothorax. No acute osseous abnormality. IMPRESSION: No active disease. Electronically Signed   By: Obie Dredge M.D.   On: 06/23/2021 17:36   CT Head Wo Contrast  Result Date:  06/23/2021 CLINICAL DATA:  Syncope, fall down stairs, history of stroke, anticoagulation EXAM: CT HEAD WITHOUT CONTRAST CT CERVICAL SPINE WITHOUT CONTRAST TECHNIQUE: Multidetector CT imaging of the head and cervical spine was performed following the standard protocol without intravenous contrast. Multiplanar CT image reconstructions of the cervical spine were also generated. COMPARISON:  None. FINDINGS: CT HEAD FINDINGS Brain: No evidence of acute infarction, hemorrhage, hydrocephalus, extra-axial collection or mass lesion/mass effect. Encephalomalacia of the high right frontal lobe (series 2, image 23). Vascular: No hyperdense vessel or unexpected calcification. Skull: Normal. Negative for fracture or focal lesion. Sinuses/Orbits: No acute finding. Other: Soft tissue hematoma of the left forehead and left occiput (series 2, image 12, 5). CT CERVICAL SPINE FINDINGS Alignment: Normal. Skull base and vertebrae: No acute fracture. No primary bone lesion or focal pathologic process. Soft tissues and spinal canal: No prevertebral fluid or swelling. No visible canal hematoma. Disc levels:  Intact. Upper chest: Negative. Other: None. IMPRESSION: 1. No acute intracranial pathology. 2. Encephalomalacia of the high right frontal lobe, in keeping with prior infarction. 3. Soft tissue hematoma of the left forehead and left occiput. 4. No fracture or static subluxation of the cervical spine. Electronically Signed   By: Lauralyn Primes M.D.   On: 06/23/2021 17:51   CT Cervical Spine Wo Contrast  Result Date: 06/23/2021 CLINICAL DATA:  Syncope, fall down stairs, history of stroke, anticoagulation EXAM: CT HEAD WITHOUT CONTRAST CT CERVICAL SPINE WITHOUT CONTRAST TECHNIQUE: Multidetector CT imaging of the head and cervical spine was performed following the standard protocol without intravenous contrast. Multiplanar CT image reconstructions of the cervical spine were also generated. COMPARISON:  None. FINDINGS: CT HEAD FINDINGS  Brain: No evidence of acute infarction, hemorrhage, hydrocephalus, extra-axial collection or mass lesion/mass effect. Encephalomalacia of the high right frontal lobe (series 2, image 23). Vascular: No hyperdense vessel or unexpected calcification. Skull: Normal. Negative for fracture or focal lesion. Sinuses/Orbits: No acute finding. Other: Soft tissue hematoma of the left forehead and left occiput (series 2, image 12, 5). CT CERVICAL SPINE FINDINGS Alignment: Normal. Skull base and vertebrae: No acute fracture. No primary bone lesion or focal pathologic process. Soft tissues and spinal canal: No prevertebral fluid or swelling. No visible canal hematoma. Disc levels:  Intact. Upper chest: Negative. Other: None. IMPRESSION: 1. No acute intracranial pathology. 2. Encephalomalacia of the high right frontal lobe, in keeping with prior infarction. 3. Soft tissue hematoma of the left  forehead and left occiput. 4. No fracture or static subluxation of the cervical spine. Electronically Signed   By: Lauralyn Primes M.D.   On: 06/23/2021 17:51    Patient Profile    [name] [name] is a 60 y.o. 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  PVD, statin intolerance, CVA with total occlusion of RICA, and brain aneurysm who is being seen 06/24/2021 for the evaluation of syncope at the request of Dr. Catha Gosselin.  Assessment & Plan     Vtach/Vfib with ICD shock - No further overnight. Remains on po amiodarone. Will discuss load with Dr. Ladona Ridgel.  2. Syncope due to #1-  she has a significant hematoma over the left eye and back of her head. She has soft tissue swelling on the CT but no intracranial bleeding.  Stable 3. Angina requiring sublingual NTG with no troponin elevation despite ICD shocks -  With negative troponins, follow conservatively for now.  Consider re-cath if troponin goes up or  more angina.   Will discuss timing for dispo with Dr. Ladona Ridgel. Would likely want at least one more day free from VT prior to d/c.   For questions or updates, please contact [facility] Please consult [email or web address] for contact info under Cardiology/STEMI.  Signed, Graciella Freer, PA-C  06/25/2021, 7:21 AM   EP Attending  Patient seen and examined. She is well know to me from prior visits. She has been admitted with syncope due to VT/VF and received appropriate ICD therapies. She has a large orbital hematoma without fracture. We will observe and decide on IV amio if she has more VT.   Sharlot Gowda Laycee Fitzsimmons,MD

## 2021-06-25 NOTE — Progress Notes (Signed)
ANTICOAGULATION CONSULT NOTE - follow up  Pharmacy Consult for Warfarin Indication: atrial fibrillation and anticardiolipin syndrome  Allergies  Allergen Reactions   Beef-Derived Products Anaphylaxis    From tick bite   Metoclopramide Hcl Anaphylaxis    Non responsive   Penicillins Anaphylaxis    Pt states she can take any cephalosporin (per pt. 01/01/19) Shock Has patient had a PCN reaction causing immediate rash, facial/tongue/throat swelling, SOB or lightheadedness with hypotension: Yes Has patient had a PCN reaction causing severe rash involving mucus membranes or skin necrosis: No Has patient had a PCN reaction that required hospitalization Yes Has patient had a PCN reaction occurring within the last 10 years: No If all of the above answers are "NO", then may proceed with Cephalosporin    Pork-Derived Products Anaphylaxis    From tick bite. Has tolerated heparin/lovenox   Metoprolol Other (See Comments)    Syncope    Reglan [Metoclopramide]    Statins Other (See Comments)    Myalgias    Patient Measurements: Height: 5\' 5"  (165.1 cm) Weight: 89.9 kg (198 lb 3.1 oz) IBW/kg (Calculated) : 57  Vital Signs: Temp: 97.5 F (36.4 C) (06/27 1129) Temp Source: Oral (06/27 1129) BP: 86/49 (06/27 1129) Pulse Rate: 53 (06/27 1129)  Labs: Recent Labs    06/23/21 1650 06/23/21 1856 06/24/21 0353 06/24/21 1342 06/24/21 1541 06/24/21 2149 06/25/21 0423  HGB 13.9  --  12.1  --   --   --  11.7*  HCT 44.0  --  37.4  --   --   --  36.0  PLT 158  --  129*  --   --   --  133*  LABPROT 21.0*  --  21.0* 20.3*  --   --  19.8*  INR 1.8*  --  1.8* 1.7*  --   --  1.7*  CREATININE 1.11*  --  0.81  --   --   --  0.99  TROPONINIHS 22* 36*  --   --  22* 21*  --      Estimated Creatinine Clearance: 68.6 mL/min (by C-G formula based on SCr of 0.99 mg/dL).   Medical History: Past Medical History:  Diagnosis Date   Aneurysm of internal carotid artery    Anti-phospholipid syndrome  (HCC)    Anticardiolipin antibody positive    Chronic Coumadin   Brain aneurysm    followed by Dr. 06/27/21   Chronic combined systolic (congestive) and diastolic (congestive) heart failure (HCC)    Cocaine abuse in remission [facility])    Coronary atherosclerosis of native coronary artery    Previous invasive cardiac testing was done at a facility in [place], [place].  Occluded diagonal, Diffuse LAD disease, Occluded Cx, and non obstructive RCA.    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 Take 1 tablet by mouth in the morning and 1 tablet every evening.   06/23/2021 at 0600   clopidogrel (PLAVIX) 75 MG tablet Take 1 tablet (75 mg total) by mouth daily with breakfast. 30 tablet 11 06/23/2021 at 0600   DULoxetine (CYMBALTA) 60 MG capsule Take 60 mg by mouth every evening.   06/22/2021   empagliflozin (JARDIANCE) 10 MG TABS tablet Take 1 tablet (10 mg total) by mouth daily before breakfast. 30 tablet 3 06/23/2021   EPINEPHrine 0.3 mg/0.3 mL IJ SOAJ injection Inject 0.3 mg into the muscle as needed.      ezetimibe (ZETIA) 10 MG tablet TAKE 1 TABLET BY MOUTH EVERY DAY 30 tablet 5 06/23/2021   isosorbide mononitrate (IMDUR) 30 MG 24 hr tablet Take 0.5 tablets (15 mg total) by mouth daily. 15  tablet 6 06/23/2021   methocarbamol (ROBAXIN) 750 MG tablet Take 750 mg by mouth every 8 (eight) hours as needed for muscle spasms.   Past Month   mexiletine (MEXITIL) 200 MG capsule Take 1 capsule (200 mg total) by mouth every 8 (eight) hours. 90 capsule 3 06/23/2021   Multiple Vitamins-Minerals (MULTIVITAMINS THER. W/MINERALS) TABS Take 1 tablet by mouth daily.     06/22/2021   nitroGLYCERIN (NITROSTAT) 0.4 MG SL tablet PLACE 1 TABLET UNDER THE TONGUE EVERY 5 MINUTES FOR 3 DOSES AS NEEDED CHEST PAIN (Patient taking differently: Place 0.4 mg under the tongue every 5 (five) minutes as needed.) 25 tablet 3 06/22/2021   oxyCODONE-acetaminophen (PERCOCET) 10-325 MG tablet Take 1 tablet by mouth every 4 (four) hours as needed for pain.   06/23/2021   pantoprazole (PROTONIX) 40 MG tablet Take 1 tablet (40 mg total) by mouth daily. 30 tablet 11 06/23/2021   potassium chloride SA (KLOR-CON) 20 MEQ tablet Take 2 tablets (40 mEq total) by mouth daily. (Patient taking differently: Take 20 mEq by mouth See admin instructions. Take 1 tablet in the morning and 1 tablet at lunch) 180 tablet 1 06/23/2021   sacubitril-valsartan (ENTRESTO) 24-26 MG Take 1 tablet by mouth 2 (two) times daily. 180 tablet 3 06/23/2021   sennosides-docusate sodium (SENOKOT-S) 8.6-50 MG tablet Take 1 tablet by mouth daily.   06/22/2021   spironolactone (ALDACTONE) 25 MG tablet Take 1 tablet (25 mg total) by mouth daily. 30 tablet 3 06/22/2021   torsemide (DEMADEX) 20 MG tablet Take 60 mg by mouth daily.   06/23/2021   traZODone (DESYREL) 100 MG tablet Take 100 mg by mouth at bedtime as needed for sleep.   Past Week   warfarin (COUMADIN) 10 MG tablet TAKE 1/2 TABLET BY MOUTH EVERY DAY EXCEPT 1 TABLET ON SUNDAYS AND THURSDAYS (Patient taking differently: Take 5 mg by mouth every evening.) 30 tablet 3 06/22/2021 at 1900   ALPRAZolam (XANAX) 0.25 MG tablet TAKE 1 TABLET BY MOUTH AT BEDTIME AS NEEDED FOR ANXIETY (Patient taking differently: Take 0.25 mg by  mouth at bedtime as needed for sleep or anxiety.) 30 tablet 0    fluticasone (FLONASE) 50 MCG/ACT nasal spray Place 1 spray into both nostrils daily as needed for allergies.  (Patient not taking: Reported on 06/23/2021)   Not Taking   torsemide 60 MG TABS Take 60 mg by mouth daily. (Patient not taking: Reported on 06/23/2021) 30 tablet 6 Not Taking    Assessment: [name] presented to ED 6/25 after episode of syncope and LOC where she rolled down 5 stairs.  Pt on Warfarin PTA and had a closed head injury and scalp hematomas.  Warfarin dose was held 6/25 PM.  PTA warfarin dose = 5mg  daily, last taken PTA ib 6/24 @ 19:00.   INR 1.8 on admit date 6/25.   No coumadin given on 6/25.  Pharmacy consulted to resume coumadin on 6/26.   INR today remains subtherapeutic at 1.7, unchanged from yesterday. CT head and CT C-spine without new acute findings at this time.  Caution with dosing due to closed head injury and scalp hematomas s/p fall prior to admit.   Goal of Therapy:  INR 2-3 Monitor platelets by anticoagulation protocol: Yes   Plan:  Warfarin 6 mg PO x 1 tonight. Daily INR  Thank you for allowing pharmacy to be part of this patients care team. 7/26, RPh Clinical Pharmacist Clinical phone for 06/25/2021 from 7:30-3:00 is [phone]  **Pharmacist phone directory can be found on [email or web address] listed under [facility].  06/25/2021 12:58 PM

## 2021-06-25 NOTE — Progress Notes (Addendum)
OT Cancellation Note  Patient Details Name: [name] [name] MRN: [id number] DOB: 1961-06-08   Cancelled Treatment:    Reason Eval/Treat Not Completed: OT screened, no needs identified, will sign off. Patient A&Ox4, reports no changes in vision, and demonstrates ADLs, bed mobility, and functional mobility with I. No acute occupational therapy needs at this time.  Kallie Edward OTR/L Supplemental OT, Department of rehab services [phone]  [name]. 06/25/2021, 12:48 PM

## 2021-06-25 NOTE — Evaluation (Signed)
Physical Therapy Evaluation Patient Details Name: [name] [name] MRN: [id number] DOB: June 30, 1961 Today's Date: 06/25/2021   History of Present Illness  Pt adm 6/25 with syncope due to Vtach/Vfib with ICD shock. Pt with lt frontal scalp hematoma due to fall from above  with negative head and neck CT PMH - CAD, vtach/vfib, afib, chf, ICM. ICD/Pacemaker, anticardiolipin syndrome, CVA, PVD.  Clinical Impression  Pt doing well with mobility and no further PT needed.  Ready for dc from PT standpoint. Pt denies any dizziness or confusion related to head injury.      Follow Up Recommendations No PT follow up    Equipment Recommendations  None recommended by PT    Recommendations for Other Services       Precautions / Restrictions Precautions Precautions: Other (comment) Precaution Comments: recent vtach/vfib with ICD shock      Mobility  Bed Mobility Overal bed mobility: Independent                  Transfers Overall transfer level: Independent Equipment used: None                Ambulation/Gait Ambulation/Gait assistance: Independent Gait Distance (Feet): 400 Feet Assistive device: None Gait Pattern/deviations: WFL(Within Functional Limits) Gait velocity: normal Gait velocity interpretation: >4.37 ft/sec, indicative of normal walking speed General Gait Details: Steady gait without difficulty  Stairs            Wheelchair Mobility    Modified Rankin (Stroke Patients Only)       Balance Overall balance assessment: No apparent balance deficits (not formally assessed)                                           Pertinent Vitals/Pain Pain Assessment: 0-10 Pain Score: 8  Pain Location: back and neck Pain Descriptors / Indicators: Aching;Constant Pain Intervention(s): Limited activity within patient's tolerance;Premedicated before session    Home Living Family/patient expects to be discharged to:: Private residence Living  Arrangements: Alone Available Help at Discharge: Friend(s) (Friend has been staying with her recently) Type of Home: House         Home Equipment: None      Prior Function Level of Independence: Independent               Hand Dominance        Extremity/Trunk Assessment   Upper Extremity Assessment Upper Extremity Assessment: Overall WFL for tasks assessed    Lower Extremity Assessment Lower Extremity Assessment: Overall WFL for tasks assessed       Communication   Communication: No difficulties  Cognition Arousal/Alertness: Awake/alert Behavior During Therapy: WFL for tasks assessed/performed Overall Cognitive Status: Within Functional Limits for tasks assessed                                        General Comments      Exercises     Assessment/Plan    PT Assessment Patent does not need any further PT services  PT Problem List         PT Treatment Interventions      PT Goals (Current goals can be found in the Care Plan section)  Acute Rehab PT Goals PT Goal Formulation: All assessment and education complete, DC therapy    Frequency  Barriers to discharge        Co-evaluation               AM-PAC PT "6 Clicks" Mobility  Outcome Measure Help needed turning from your back to your side while in a flat bed without using bedrails?: None Help needed moving from lying on your back to sitting on the side of a flat bed without using bedrails?: None Help needed moving to and from a bed to a chair (including a wheelchair)?: None Help needed standing up from a chair using your arms (e.g., wheelchair or bedside chair)?: None Help needed to walk in hospital room?: None Help needed climbing 3-5 steps with a railing? : None 6 Click Score: 24    End of Session Equipment Utilized During Treatment: Gait belt Activity Tolerance: Patient tolerated treatment well Patient left: in bed;with call bell/phone within reach;with  nursing/sitter in room (sitting EOB) Nurse Communication: Mobility status PT Visit Diagnosis: Other abnormalities of gait and mobility (R26.89)    Time: 6384-5364 PT Time Calculation (min) (ACUTE ONLY): 9 min   Charges:   PT Evaluation $PT Eval Low Complexity: 1 Low          [facility] PT Acute Rehabilitation Services Pager [phone] Office [phone]   Angelina Ok [facility] 06/25/2021, 12:11 PM

## 2021-06-25 NOTE — Progress Notes (Signed)
PROGRESS NOTE    [name] [name]  HKV:[id number] DOB: [birth year] DOA: 06/23/2021 PCP: [name], FNP   Brief Narrative:  HPI On 06/23/2021 by Dr. Shon Hale Soleia Bezold  is a 60 y.o. female smoker with past medical history relevant for recurrent V. tach/V. fib, persistent A. fib status post prior TEE with cardioversion, combined systolic and diastolic dysfunction CHF with last known EF of 25 to 30% in the setting of ischemic cardiomyopathy, CAD with left circumflex stent in January 2022, antiphospholipid syndrome requiring chronic anticoagulation with Coumadin who presents to the ED after episode of syncope and loss of consciousness with closed head injury and scalp hematomas -AICD/Pacemaker was interrogated and showed multiple episodes of V. fib and V. tach.  She was shocked once during this episode -Creatinine is 1.1, potassium is 3.6,, magnesium TSH and phosphorus are pending -CT head and CT C-spine without new acute findings at this time INR is 1.8, CBC WNL   -- Denies chest pains or palpitations at this time, no increased shortness of breath -No fever  Or chills   No Nausea, Vomiting or Diarrhea  Interim history Admitted for syncope, VT/VF. Cardiology consulted and started on IV amio.  Assessment & Plan   Syncope -Likely cardiogenic in etiology -Patient presented after a fall with closed head injury and frontal scalp/orbital hematoma -AICD/pacemaker interrogated at [facility] and showed multiple episodes of V. fib and V. tach with 1 shock -Patient was transferred to [facility] -Cardiology consulted and appreciated- wanting to start IV amiodarone  -PT and OT consulted- pending eval  V. fib/V. fib tach -Cardiology consulted and appreciated -Patient follows up with Dr. Taylor-currently on mexiletine and amiodarone -Patient has been started on IV amiodarone   Closed head injury -As above, secondary to fall -Patient with L frontal scalp hematoma as well as  lateral neck -CT head and neck without acute findings -Continue to monitor  History of persistent atrial fibrillation -Patient status post TEE with cardioversion December 2021, currently on Coumadin therapy and antiarrhythmic-amiodarone, mexiletine, Coreg  Chronic combined systolic and diastolic heart failure/ischemic cardiomyopathy -Currently appears to be euvolemic and stable -Patient follows with Dr. Shirlee Latch -Echocardiogram 12/01/2020 showed an EF of 25 to 30%, with regional wall motion abnormalities with severe hypokinesis to akinesis of the anterior and septal walls -Continue Demadex, spironolactone, Entresto -Continue to monitor daily weights, intake and output  CAD -Stable, patient currently chest pain-free -Status post LHC with proximal left circumflex stent placed in January 2022 -Currently on Plavix, Imdur -Patient is intolerant to statins, on Zetia  Tobacco abuse -Discussed tobacco cessation  Anxiety -Continue Xanax as needed  Anticardiolipin syndrome/hypercoagulable state -Patient is on Coumadin- restarted, INR subtherapeutic, 1.7  DVT Prophylaxis SCDs, Coumadin   Code Status: Full  Family Communication: None at bedside  Disposition Plan:  Status is: Inpatient  Remains inpatient appropriate because:Inpatient level of care appropriate due to severity of illness  Dispo: The patient is from: Home              Anticipated d/c is to: Home              Patient currently is not medically stable to d/c.   Difficult to place patient No   Consultants Cardiology  Procedures  None  Antibiotics   Anti-infectives (From admission, onward)    None       Subjective:   [name] [name] seen and examined today.  Currently no complaints today. Denies chest pain, shortness of breath, abdominal  pain, nausea or vomiting, current dizziness or headache.  Continues to feel anxious.    Objective:   Vitals:   06/24/21 1615 06/24/21 2000 06/25/21 0447 06/25/21 0452  BP:  (!) 92/54 (!) 104/47  121/64  Pulse: 61 66  (!) 55  Resp: 17 16  20   Temp: 97.7 F (36.5 C) 97.8 F (36.6 C)  98 F (36.7 C)  TempSrc: Oral   Oral  SpO2: 97% 96%  100%  Weight:   89.9 kg   Height:        Intake/Output Summary (Last 24 hours) at 06/25/2021 0919 Last data filed at 06/25/2021 06/27/2021 Gross per 24 hour  Intake 1080 ml  Output 3000 ml  Net -1920 ml    Filed Weights   06/24/21 0121 06/24/21 0223 06/25/21 0447  Weight: 89.4 kg 89.4 kg 89.9 kg    Exam General: Well developed, well nourished, NAD, appears stated age HEENT: NCAT, left frontal scalp hematoma with orbital edema and ecchymosis spreading to right periorbital space,  mucous membranes moist.  Neck: Supple, left posterior lateral ecchymosis Cardiovascular: S1 S2 auscultated, RRR Respiratory: Clear to auscultation bilaterally, no wheezing Abdomen: Soft, nontender, nondistended, + bowel sounds Extremities: warm dry without cyanosis clubbing or edema Neuro: AAOx3, nonfocal Psych: anxious however appropriate   Data Reviewed: I have personally reviewed following labs and imaging studies  CBC: Recent Labs  Lab 06/23/21 1650 06/24/21 0353 06/25/21 0423  WBC 8.3 7.3 7.3  NEUTROABS 6.3  --   --   HGB 13.9 12.1 11.7*  HCT 44.0 37.4 36.0  MCV 93.0 92.6 91.8  PLT 158 129* 133*    Basic Metabolic Panel: Recent Labs  Lab 06/23/21 1650 06/23/21 1856 06/24/21 0353 06/25/21 0423  NA 136  --  138 135  K 3.6  --  3.9 4.4  CL 100  --  105 100  CO2 27  --  27 29  GLUCOSE 141*  --  119* 83  BUN 24*  --  16 17  CREATININE 1.11*  --  0.81 0.99  CALCIUM 9.0  --  8.7* 8.6*  MG  --  2.3  --   --   PHOS  --  5.3*  --   --     GFR: Estimated Creatinine Clearance: 68.6 mL/min (by C-G formula based on SCr of 0.99 mg/dL). Liver Function Tests: Recent Labs  Lab 06/23/21 1650  AST 26  ALT 27  ALKPHOS 105  BILITOT 0.4  PROT 7.4  ALBUMIN 3.8    No results for input(s): LIPASE, AMYLASE in the last 168  hours. No results for input(s): AMMONIA in the last 168 hours. Coagulation Profile: Recent Labs  Lab 06/23/21 1650 06/24/21 0353 06/24/21 1342 06/25/21 0423  INR 1.8* 1.8* 1.7* 1.7*    Cardiac Enzymes: No results for input(s): CKTOTAL, CKMB, CKMBINDEX, TROPONINI in the last 168 hours. BNP (last 3 results) No results for input(s): PROBNP in the last 8760 hours. HbA1C: No results for input(s): HGBA1C in the last 72 hours. CBG: No results for input(s): GLUCAP in the last 168 hours. Lipid Profile: No results for input(s): CHOL, HDL, LDLCALC, TRIG, CHOLHDL, LDLDIRECT in the last 72 hours. Thyroid Function Tests: Recent Labs    06/23/21 1640  TSH 3.401    Anemia Panel: No results for input(s): VITAMINB12, FOLATE, FERRITIN, TIBC, IRON, RETICCTPCT in the last 72 hours. Urine analysis:    Component Value Date/Time   COLORURINE YELLOW 12/14/2020 1829   APPEARANCEUR CLEAR 12/14/2020 1829  LABSPEC 1.009 12/14/2020 1829   PHURINE 5.0 12/14/2020 1829   GLUCOSEU 50 (A) 12/14/2020 1829   HGBUR SMALL (A) 12/14/2020 1829   BILIRUBINUR NEGATIVE 12/14/2020 1829   KETONESUR NEGATIVE 12/14/2020 1829   PROTEINUR NEGATIVE 12/14/2020 1829   UROBILINOGEN 0.2 12/23/2011 1440   NITRITE NEGATIVE 12/14/2020 1829   LEUKOCYTESUR NEGATIVE 12/14/2020 1829   Sepsis Labs: @LABRCNTIP (procalcitonin:4,lacticidven:4)  ) Recent Results (from the past 240 hour(s))  Resp Panel by RT-PCR (Flu A&B, Covid) Nasopharyngeal Swab     Status: None   Collection Time: 06/23/21  9:40 PM   Specimen: Nasopharyngeal Swab; Nasopharyngeal(NP) swabs in vial transport medium  Result Value Ref Range Status   SARS Coronavirus 2 by RT PCR NEGATIVE NEGATIVE Final    Comment: (NOTE) SARS-CoV-2 target nucleic acids are NOT DETECTED.  The SARS-CoV-2 RNA is generally detectable in upper respiratory specimens during the acute phase of infection. The lowest concentration of SARS-CoV-2 viral copies this assay can detect  is 138 copies/mL. A negative result does not preclude SARS-Cov-2 infection and should not be used as the sole basis for treatment or other patient management decisions. A negative result may occur with  improper specimen collection/handling, submission of specimen other than nasopharyngeal swab, presence of viral mutation(s) within the areas targeted by this assay, and inadequate number of viral copies(<138 copies/mL). A negative result must be combined with clinical observations, patient history, and epidemiological information. The expected result is Negative.  Fact Sheet for Patients:  06/25/21  Fact Sheet for Healthcare Providers:  BloggerCourse.com  This test is no t yet approved or cleared by the [employer]ious[employer]ker.[place] FDA and  has been authorized for detection and/or diagnosis of SARS-CoV-2 by FDA under an Emergency Use Authorization (EUA). This EUA will remain  in effect (meaning this test can be used) for the duration of the COVID-19 declaration under Section 564(b)(1) of the Act, 21 U.S.C.section 360bbb-3(b)(1), unless the authorization is terminated  or revoked sooner.       Influenza A by PCR NEGATIVE NEGATIVE Final   Influenza B by PCR NEGATIVE NEGATIVE Final    Comment: (NOTE) The Xpert Xpress SARS-CoV-2/FLU/RSV plus assay is intended as an aid in the diagnosis of influenza from Nasopharyngeal swab specimens and should not be used as a sole basis for treatment. Nasal washings and aspirates are unacceptable for Xpert Xpress SARS-CoV-2/FLU/RSV testing.  Fact Sheet for Patients: [place]  Fact Sheet for Healthcare Providers: BloggerCourse.com  This test is not yet approved or cleared by the SeriousBroker.it FDA and has been authorized for detection and/or diagnosis of SARS-CoV-2 by FDA under an Emergency Use Authorization (EUA). This EUA will remain in effect  (meaning this test can be used) for the duration of the COVID-19 declaration under Section 564(b)(1) of the Act, 21 U.S.C. section 360bbb-3(b)(1), unless the authorization is terminated or revoked.  Performed at [facility], [address] [place], [place]       Radiology Studies: DG Chest 1 View  Result Date: 06/23/2021 CLINICAL DATA:  Syncope. EXAM: CHEST  1 VIEW COMPARISON:  Chest x-ray dated March 01, 2021. FINDINGS: Unchanged left chest wall pacemaker. Stable mild cardiomegaly. Unchanged linear scarring in the left greater than right lungs. No focal consolidation, pleural effusion, or pneumothorax. No acute osseous abnormality. IMPRESSION: No active disease. Electronically Signed   By: March 03, 2021 M.D.   On: 06/23/2021 17:36   CT Head Wo Contrast  Result Date: 06/23/2021 CLINICAL DATA:  Syncope, fall down stairs, history of stroke, anticoagulation  EXAM: CT HEAD WITHOUT CONTRAST CT CERVICAL SPINE WITHOUT CONTRAST TECHNIQUE: Multidetector CT imaging of the head and cervical spine was performed following the standard protocol without intravenous contrast. Multiplanar CT image reconstructions of the cervical spine were also generated. COMPARISON:  None. FINDINGS: CT HEAD FINDINGS Brain: No evidence of acute infarction, hemorrhage, hydrocephalus, extra-axial collection or mass lesion/mass effect. Encephalomalacia of the high right frontal lobe (series 2, image 23). Vascular: No hyperdense vessel or unexpected calcification. Skull: Normal. Negative for fracture or focal lesion. Sinuses/Orbits: No acute finding. Other: Soft tissue hematoma of the left forehead and left occiput (series 2, image 12, 5). CT CERVICAL SPINE FINDINGS Alignment: Normal. Skull base and vertebrae: No acute fracture. No primary bone lesion or focal pathologic process. Soft tissues and spinal canal: No prevertebral fluid or swelling. No visible canal hematoma. Disc levels:  Intact. Upper chest: Negative. Other:  None. IMPRESSION: 1. No acute intracranial pathology. 2. Encephalomalacia of the high right frontal lobe, in keeping with prior infarction. 3. Soft tissue hematoma of the left forehead and left occiput. 4. No fracture or static subluxation of the cervical spine. Electronically Signed   By: Lauralyn Primes M.D.   On: 06/23/2021 17:51   CT Cervical Spine Wo Contrast  Result Date: 06/23/2021 CLINICAL DATA:  Syncope, fall down stairs, history of stroke, anticoagulation EXAM: CT HEAD WITHOUT CONTRAST CT CERVICAL SPINE WITHOUT CONTRAST TECHNIQUE: Multidetector CT imaging of the head and cervical spine was performed following the standard protocol without intravenous contrast. Multiplanar CT image reconstructions of the cervical spine were also generated. COMPARISON:  None. FINDINGS: CT HEAD FINDINGS Brain: No evidence of acute infarction, hemorrhage, hydrocephalus, extra-axial collection or mass lesion/mass effect. Encephalomalacia of the high right frontal lobe (series 2, image 23). Vascular: No hyperdense vessel or unexpected calcification. Skull: Normal. Negative for fracture or focal lesion. Sinuses/Orbits: No acute finding. Other: Soft tissue hematoma of the left forehead and left occiput (series 2, image 12, 5). CT CERVICAL SPINE FINDINGS Alignment: Normal. Skull base and vertebrae: No acute fracture. No primary bone lesion or focal pathologic process. Soft tissues and spinal canal: No prevertebral fluid or swelling. No visible canal hematoma. Disc levels:  Intact. Upper chest: Negative. Other: None. IMPRESSION: 1. No acute intracranial pathology. 2. Encephalomalacia of the high right frontal lobe, in keeping with prior infarction. 3. Soft tissue hematoma of the left forehead and left occiput. 4. No fracture or static subluxation of the cervical spine. Electronically Signed   By: Lauralyn Primes M.D.   On: 06/23/2021 17:51     Scheduled Meds:  amiodarone  200 mg Oral Once per day on Sun Sat   amiodarone  400 mg  Oral Once per day on Mon Tue Wed Thu Fri   carvedilol  12.5 mg Oral BID WC   clopidogrel  75 mg Oral Q breakfast   DULoxetine  60 mg Oral QHS   ezetimibe  10 mg Oral Daily   isosorbide mononitrate  15 mg Oral Daily   mexiletine  200 mg Oral Once   mexiletine  200 mg Oral Q8H   multivitamin with minerals  1 tablet Oral Daily   pantoprazole  40 mg Oral Daily   potassium chloride SA  20 mEq Oral BID WC   sacubitril-valsartan  1 tablet Oral BID   senna-docusate  1 tablet Oral Daily   sodium chloride flush  3 mL Intravenous Q12H   sodium chloride flush  3 mL Intravenous Q12H   spironolactone  25 mg Oral QPM  torsemide  60 mg Oral Daily   Warfarin - Pharmacist Dosing Inpatient   Does not apply q1600   Continuous Infusions:  sodium chloride       LOS: 1 day   Time Spent in minutes   30 minutes  Patrik Turnbaugh D.O. on 06/25/2021 at 9:19 AM  Between 7am to 7pm - Please see pager noted on [email or web address]  After 7pm go to [email or web address]  And look for the night coverage person covering for me after hours  [facility] Office  [phone]

## 2021-06-26 LAB — PROTIME-INR
INR: 1.8 — ABNORMAL HIGH (ref 0.8–1.2)
Prothrombin Time: 21.1 seconds — ABNORMAL HIGH (ref 11.4–15.2)

## 2021-06-26 MED ORDER — METHOCARBAMOL 750 MG PO TABS: 750.0000 mg | ORAL_TABLET | Freq: Three times a day (TID) | ORAL | 0 refills | Status: DC | PRN

## 2021-06-26 MED ORDER — AMIODARONE HCL 200 MG PO TABS: 200.0000 mg | ORAL_TABLET | Freq: Two times a day (BID) | ORAL | 1 refills | Status: DC

## 2021-06-26 NOTE — Progress Notes (Addendum)
Electrophysiology Rounding Note  Patient Name: [name] [name] Date of Encounter: 06/26/2021  Primary Cardiologist: Nona Dell, MD Electrophysiologist: Lewayne Bunting, MD   Subjective   The patient is doing well today.  At this time, the patient denies chest pain, shortness of breath, or any new concerns.  No further VT  Inpatient Medications    Scheduled Meds:  amiodarone  200 mg Oral Once per day on Sun Sat   amiodarone  400 mg Oral Once per day on Mon Tue Wed Thu Fri   carvedilol  12.5 mg Oral BID WC   clopidogrel  75 mg Oral Q breakfast   DULoxetine  60 mg Oral QHS   ezetimibe  10 mg Oral Daily   isosorbide mononitrate  15 mg Oral Daily   mexiletine  200 mg Oral Once   mexiletine  200 mg Oral Q8H   multivitamin with minerals  1 tablet Oral Daily   pantoprazole  40 mg Oral Daily   potassium chloride SA  20 mEq Oral BID WC   sacubitril-valsartan  1 tablet Oral BID   senna-docusate  1 tablet Oral Daily   sodium chloride flush  3 mL Intravenous Q12H   sodium chloride flush  3 mL Intravenous Q12H   spironolactone  25 mg Oral QPM   torsemide  60 mg Oral Daily   Warfarin - Pharmacist Dosing Inpatient   Does not apply q1600   Continuous Infusions:  sodium chloride     PRN Meds: sodium chloride, acetaminophen **OR** acetaminophen, ALPRAZolam, bisacodyl, magnesium hydroxide, methocarbamol, nitroGLYCERIN, ondansetron **OR** ondansetron (ZOFRAN) IV, oxyCODONE-acetaminophen **AND** oxyCODONE, polyethylene glycol, sodium chloride flush, traZODone   Vital Signs    Vitals:   06/25/21 1612 06/25/21 2006 06/26/21 0519 06/26/21 0638  BP: (!) 104/54 114/60 (!) 90/36 118/62  Pulse: (!) 57 62 (!) 53   Resp:  15 16   Temp:  98.4 F (36.9 C) 98.3 F (36.8 C)   TempSrc:  Oral Oral   SpO2: 94% 98% 95%   Weight:   88.5 kg   Height:        Intake/Output Summary (Last 24 hours) at 06/26/2021 0752 Last data filed at 06/26/2021 6789 Gross per 24 hour  Intake 1785 ml  Output 4550  ml  Net -2765 ml   Filed Weights   06/24/21 0223 06/25/21 0447 06/26/21 0519  Weight: 89.4 kg 89.9 kg 88.5 kg    Physical Exam    GEN- The patient is well appearing, alert and oriented x 3 today.   Head- normocephalic, atraumatic Eyes-  Sclera clear, conjunctiva pink Ears- hearing intact Oropharynx- clear Neck- supple Lungs- Clear to ausculation bilaterally, normal work of breathing Heart- Regular rate and rhythm, no murmurs, rubs or gallops GI- soft, NT, ND, + BS Extremities- no clubbing or cyanosis. No edema Skin- no rash or lesion Psych- euthymic mood, full affect Neuro- strength and sensation are intact  Labs    CBC Recent Labs    06/23/21 1650 06/24/21 0353 06/25/21 0423  WBC 8.3 7.3 7.3  NEUTROABS 6.3  --   --   HGB 13.9 12.1 11.7*  HCT 44.0 37.4 36.0  MCV 93.0 92.6 91.8  PLT 158 129* 133*   Basic Metabolic Panel Recent Labs    38/10/17 1856 06/24/21 0353 06/25/21 0423  NA  --  138 135  K  --  3.9 4.4  CL  --  105 100  CO2  --  27 29  GLUCOSE  --  119*  83  BUN  --  16 17  CREATININE  --  0.81 0.99  CALCIUM  --  8.7* 8.6*  MG 2.3  --   --   PHOS 5.3*  --   --    Liver Function Tests Recent Labs    06/23/21 1650  AST 26  ALT 27  ALKPHOS 105  BILITOT 0.4  PROT 7.4  ALBUMIN 3.8   No results for input(s): LIPASE, AMYLASE in the last 72 hours. Cardiac Enzymes No results for input(s): CKTOTAL, CKMB, CKMBINDEX, TROPONINI in the last 72 hours.   Telemetry    Sinus Brady/NSR 50-60s (personally reviewed)  Radiology    No results found.  Patient Profile     [name] [name] is a 60 y.o. 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  PVD, statin intolerance, CVA with total occlusion of RICA, and brain aneurysm who is being seen 06/24/2021 for the evaluation of syncope at the request of Dr.  Catha Gosselin.  Assessment & Plan      Vtach/Vfib with ICD shock - No further.  Continue amiodarone. Will discuss dosing for home with Dr. Ladona Ridgel.   2. Syncope due to #1- Hematoma remains but discomfort improved. Stable.   3. Angina requiring sublingual NTG with no troponin elevation despite ICD shocks -  With negative troponins, follow conservatively for now. Stressed importance of remaining on Plavix. Consider re-cath if troponin goes up, more angina, or refractory VT on continued amiodarone.   VT free for > 48 hours. OK for home on 200 mg BID amiodarone with follow up as scheduled.   INR 1.8. Per primary team. She has appointment with coumadin clinic on Thursday.   For questions or updates, please contact [facility] Please consult [email or web address] for contact info under Cardiology/STEMI.  Signed, Graciella Freer, PA-C  06/26/2021, 7:52 AM   EP Attending  Patient seen and examined. Agree with above. She has not had more VT overnight. Her hematoma appears better. I think she can be discharged home on amiodarone 200 mg twice daily. I would not be aggressive about dosing her coumadin as I would prefer her INR be in the low 2 range in the setting of multiple soft tissue swelling. Followup as noted above.  Sharlot Gowda Jarae Nemmers,MD

## 2021-06-26 NOTE — Consult Note (Signed)
   [facility] Inpatient Consult   06/26/2021  KATARINA RIEBE 02/08/1961 [id number]  [facility] [[facility]]  Accountable Care Organization [ACO] Patient: [employer]  Late entry 11:10 am  - Patient screened for 4 hospitalization in 6 months was discussed in unit progression meeting with noted extreme high risk score for unplanned readmission risk. Patient was assess for potential [facility]  [[facility]] Care Management service with Embedded Chronic Care Management needs for post hospital transition.  Review of patient's medical record reveals patient is in the [facility] which has a chronic care management program.  Primary Care Provider:  [name], FNP, [facility], confirmed.  Plan:  Patient already transitioned home when called and for new medications noted from inpatient TOC and discussion.  Will refer patient for follow up for chronic care management program.    For questions contact:   Charlesetta Shanks, RN BSN CCM [facility]  [phone] business mobile phone Toll free office [phone]  Fax number: [phone] [email or web address]

## 2021-06-26 NOTE — Discharge Instructions (Signed)
Information on my medicine - Coumadin   (Warfarin)  This medication education was reviewed with me or my healthcare representative as part of my discharge preparation. You were taking this medication prior to this hospital admission.   Why was Coumadin prescribed for you? Coumadin was prescribed for you because you have a blood clot or a medical condition that can cause an increased risk of forming blood clots. Blood clots can cause serious health problems by blocking the flow of blood to the heart, lung, or brain. Coumadin can prevent harmful blood clots from forming. As a reminder your indication for Coumadin is:   Select from menu  What test will check on my response to Coumadin? While on Coumadin (warfarin) you will need to have an INR test regularly to ensure that your dose is keeping you in the desired range. The INR (international normalized ratio) number is calculated from the result of the laboratory test called prothrombin time (PT).  If an INR APPOINTMENT HAS NOT ALREADY BEEN MADE FOR YOU please schedule an appointment to have this lab work done by your health care provider within 7 days. Your INR goal is usually a number between:  2 to 3 or your provider may give you a more narrow range like 2-2.5.  Ask your health care provider during an office visit what your goal INR is.  What  do you need to  know  About  COUMADIN? Take Coumadin (warfarin) exactly as prescribed by your healthcare provider about the same time each day.  DO NOT stop taking without talking to the doctor who prescribed the medication.  Stopping without other blood clot prevention medication to take the place of Coumadin may increase your risk of developing a new clot or stroke.  Get refills before you run out.  What do you do if you miss a dose? If you miss a dose, take it as soon as you remember on the same day then continue your regularly scheduled regimen the next day.  Do not take two doses of Coumadin at the same  time.  Important Safety Information A possible side effect of Coumadin (Warfarin) is an increased risk of bleeding. You should call your healthcare provider right away if you experience any of the following: Bleeding from an injury or your nose that does not stop. Unusual colored urine (red or dark brown) or unusual colored stools (red or black). Unusual bruising for unknown reasons. A serious fall or if you hit your head (even if there is no bleeding).  Some foods or medicines interact with Coumadin (warfarin) and might alter your response to warfarin. To help avoid this: Eat a balanced diet, maintaining a consistent amount of Vitamin K. Notify your provider about major diet changes you plan to make. Avoid alcohol or limit your intake to 1 drink for women and 2 drinks for men per day. (1 drink is 5 oz. wine, 12 oz. beer, or 1.5 oz. liquor.)  Make sure that ANY health care provider who prescribes medication for you knows that you are taking Coumadin (warfarin).  Also make sure the healthcare provider who is monitoring your Coumadin knows when you have started a new medication including herbals and non-prescription products.  Coumadin (Warfarin)  Major Drug Interactions  Increased Warfarin Effect Decreased Warfarin Effect  Alcohol (large quantities) Antibiotics (esp. Septra/Bactrim, Flagyl, Cipro) Amiodarone (Cordarone) Aspirin (ASA) Cimetidine (Tagamet) Megestrol (Megace) NSAIDs (ibuprofen, naproxen, etc.) Piroxicam (Feldene) Propafenone (Rythmol SR) Propranolol (Inderal) Isoniazid (INH) Posaconazole (Noxafil) Barbiturates (Phenobarbital) Carbamazepine (  Tegretol) Chlordiazepoxide (Librium) Cholestyramine (Questran) Griseofulvin Oral Contraceptives Rifampin Sucralfate (Carafate) Vitamin K   Coumadin (Warfarin) Major Herbal Interactions  Increased Warfarin Effect Decreased Warfarin Effect  Garlic Ginseng Ginkgo biloba Coenzyme Q10 Green tea St. John's wort     Coumadin (Warfarin) FOOD Interactions  Eat a consistent number of servings per week of foods HIGH in Vitamin K (1 serving =  cup)  Collards (cooked, or boiled & drained) Kale (cooked, or boiled & drained) Mustard greens (cooked, or boiled & drained) Parsley *serving size only =  cup Spinach (cooked, or boiled & drained) Swiss chard (cooked, or boiled & drained) Turnip greens (cooked, or boiled & drained)  Eat a consistent number of servings per week of foods MEDIUM-HIGH in Vitamin K (1 serving = 1 cup)  Asparagus (cooked, or boiled & drained) Broccoli (cooked, boiled & drained, or raw & chopped) Brussel sprouts (cooked, or boiled & drained) *serving size only =  cup Lettuce, raw (green leaf, endive, romaine) Spinach, raw Turnip greens, raw & chopped   These websites have more information on Coumadin (warfarin):  http://www.king-russell.com/; https://www.hines.net/;

## 2021-06-26 NOTE — TOC Initial Note (Signed)
Transition of Care [facility]) - Initial/Assessment Note    Patient Details  Name: [name] [name] MRN: [id number] Date of Birth: Mar 23, 1961  [facility] CM/SW Contact:    Leone Haven, RN Phone Number: 06/26/2021, 11:28 AM  Clinical Narrative:                 Patient is form home, she has a friend who lives with her to help her out,his name is Diplomatic Services operational officer.  She also states that another Friend Tammy helps her out also, by taking her to the MD apts sometime.  She has a walker at home.  She goes to pharmacy in [place].  She is for dc today, she has no other needs.   Expected Discharge Plan: Home/Self Care Barriers to Discharge: No Barriers Identified   Patient Goals and CMS Choice Patient states their goals for this hospitalization and ongoing recovery are:: return home   Choice offered to / list presented to : NA  Expected Discharge Plan and Services Expected Discharge Plan: Home/Self Care In-house Referral: NA Discharge Planning Services: CM Consult Post Acute Care Choice: NA Living arrangements for the past 2 months: Single Family Home Expected Discharge Date: 06/26/21                 DME Agency: NA       HH Arranged: NA          Prior Living Arrangements/Services Living arrangements for the past 2 months: Single Family Home Lives with:: Friends Patient language and need for interpreter reviewed:: Yes Do you feel safe going back to the place where you live?: Yes      Need for Family Participation in Patient Care: Yes (Comment) Care giver support system in place?: Yes (comment)   Criminal Activity/Legal Involvement Pertinent to Current Situation/Hospitalization: No - Comment as needed  Activities of Daily Living Home Assistive Devices/Equipment: None ADL Screening (condition at time of admission) Patient's cognitive ability adequate to safely complete daily activities?: Yes Is the patient deaf or have difficulty hearing?: No Does the patient have  difficulty seeing, even when wearing glasses/contacts?: No Does the patient have difficulty concentrating, remembering, or making decisions?: No Patient able to express need for assistance with ADLs?: Yes Does the patient have difficulty dressing or bathing?: No Independently performs ADLs?: Yes (appropriate for developmental age) Does the patient have difficulty walking or climbing stairs?: No Weakness of Legs: None Weakness of Arms/Hands: None  Permission Sought/Granted                  Emotional Assessment   Attitude/Demeanor/Rapport: Engaged Affect (typically observed): Appropriate Orientation: : Oriented to Self, Oriented to Place, Oriented to  Time, Oriented to Situation Alcohol / Substance Use: Not Applicable Psych Involvement: No (comment)  Admission diagnosis:  Syncope [R55] SOB (shortness of breath) [R06.02] Syncope, cardiogenic [R55] Patient Active Problem List   Diagnosis Date Noted   Syncope 06/23/2021   Postmenopausal 05/23/2021   Allergy to alpha-gal 05/23/2021   Insomnia 02/09/2021   Unstable angina (HCC) 01/11/2021   Defibrillator discharge    CAD in native artery    Persistent atrial fibrillation (HCC) 12/15/2020   Prediabetes 12/15/2020   Ventricular tachycardia (HCC) 12/15/2020   Right carotid artery occlusion    GERD (gastroesophageal reflux disease) 08/11/2020   Allergic rhinitis 08/11/2020   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   Tobacco dependence 06/18/2017   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 [place] - [address] [place] [address] Phone: [phone] Fax: [phone]     Social Determinants of Health (SDOH) Interventions    Readmission Risk Interventions Readmission Risk Prevention Plan 06/26/2021  Transportation Screening Complete  Medication Review (RN Care Manager) Complete  PCP or Specialist appointment within 3-5 days of discharge Complete  HRI or Home Care Consult Complete  SW Recovery Care/Counseling Consult Complete  Palliative Care Screening Not Applicable  Skilled Nursing Facility Not Applicable  Some recent data might be hidden

## 2021-06-26 NOTE — Plan of Care (Signed)

## 2021-06-26 NOTE — Progress Notes (Signed)
D/C instructions given and reviewed. Tele and IV removed, tolerated well. Friend will arrive to transport soon.

## 2021-06-26 NOTE — Discharge Summary (Addendum)
Physician Discharge Summary  Arnetha MassySusan H Dingee AOZ:[id number] DOB: 03/12/1961 DOA: 06/23/2021  PCP: Gwenlyn FudgeJoyce, Britney F, FNP  Admit date: 06/23/2021 Discharge date: 06/26/2021  Time spent: 45 minutes  Recommendations for Outpatient Follow-up:  Patient will be discharged to home.  Patient will need to follow up with primary care provider within one week of discharge.  Follow up with cardiology. Patient should continue medications as prescribed.  Patient should follow a heart healthy diet.    Discharge Diagnoses:  Syncope V. fib/V. fib tach Closed head injury History of persistent atrial fibrillation Chronic combined systolic and diastolic heart failure/ischemic cardiomyopathy CAD Tobacco abuse Anxiety Anticardiolipin syndrome/hypercoagulable state  Discharge Condition: Stable  Diet recommendation: heart healthy  Filed Weights   06/24/21 0223 06/25/21 0447 06/26/21 0519  Weight: 89.4 kg 89.9 kg 88.5 kg    History of present illness:  On 06/23/2021 by Dr. Shon Haleourage Emokpae Donzetta SprungSusan Voorhees  is a 60 y.o. female smoker with past medical history relevant for recurrent V. tach/V. fib, persistent A. fib status post prior TEE with cardioversion, combined systolic and diastolic dysfunction CHF with last known EF of 25 to 30% in the setting of ischemic cardiomyopathy, CAD with left circumflex stent in January 2022, antiphospholipid syndrome requiring chronic anticoagulation with Coumadin who presents to the ED after episode of syncope and loss of consciousness with closed head injury and scalp hematomas -AICD/Pacemaker was interrogated and showed multiple episodes of V. fib and V. tach.  She was shocked once during this episode -Creatinine is 1.1, potassium is 3.6,, magnesium TSH and phosphorus are pending -CT head and CT C-spine without new acute findings at this time INR is 1.8, CBC WNL   -- Denies chest pains or palpitations at this time, no increased shortness of breath -No fever  Or chills    No Nausea, Vomiting or Diarrhea  Hospital Course:  Syncope -Likely cardiogenic in etiology -Patient presented after a fall with closed head injury and frontal scalp/orbital hematoma -[facility]ICD[facility]pac[facility] and showed multiple episodes of V. fib and V. tach with 1 shock -[facility] -Cardiology consulted and appreciated-recommending amiodarone 200mg  BID with outpatient follow up -PT and OT consulted- no follow up recommended   V. fib/V. fib tach -Cardiology consulted and appreciated -Patient follows up with Dr. Taylor-currently on mexiletine and amiodarone -Continue IV amiodarone    Closed head injury -As above, secondary to fall -Patient with L frontal scalp hematoma as well as lateral neck -CT head and neck without acute findings -Continue to monitor   History of persistent atrial fibrillation -Patient status post TEE with cardioversion December 2021, currently on Coumadin therapy and antiarrhythmic-amiodarone, mexiletine, Coreg   Chronic combined systolic and diastolic heart failure/ischemic cardiomyopathy -Currently appears to be euvolemic and stable -Patient follows with Dr. Shirlee LatchMcLean -Echocardiogram 12/01/2020 showed an EF of 25 to 30%, with regional wall motion abnormalities with severe hypokinesis to akinesis of the anterior and septal walls -Continue Demadex, spironolactone, Entresto -Continue to monitor daily weights, intake and output   CAD -Stable, patient currently chest pain-free -Status post LHC with proximal left circumflex stent placed in January 2022 -Currently on Plavix, Imdur -Patient is intolerant to statins, on Zetia   Tobacco abuse -Discussed tobacco cessation   Anxiety -Continue Xanax as needed   Anticardiolipin syndrome/hypercoagulable state -Patient is on Coumadin- restarted, INR subtherapeutic, 1.8 -patient has a follow up with coumadin cilinc on 6/30  Consultants Cardiology    Procedures None  Discharge Exam: Vitals:  06/26/21 0638 06/26/21 1059  BP: 118/62 123/65  Pulse:  (!) 56  Resp:  16  Temp:  97.8 F (36.6 C)  SpO2:  100%   Exam General: Well developed, well nourished, NAD, appears stated age HEENT: NCAT, left frontal scalp hematoma with orbital edema and ecchymosis spreading to right periorbital space,  mucous membranes moist. Neck: Supple, left posterior lateral ecchymosis Cardiovascular: S1 S2 auscultated, RRR Respiratory: Clear to auscultation bilaterally, no wheezing Abdomen: Soft, nontender, nondistended, + bowel sounds Extremities: warm dry without cyanosis clubbing or edema Neuro: AAOx3, nonfocal Psych: Appropriate mood and affect, pleasant   Discharge Instructions Discharge Instructions     (HEART FAILURE PATIENTS) Call MD:  Anytime you have any of the following symptoms: 1) 3 pound weight gain in 24 hours or 5 pounds in 1 week 2) shortness of breath, with or without a dry hacking cough 3) swelling in the hands, feet or stomach 4) if you have to sleep on extra pillows at night in order to breathe.   Complete by: As directed    Diet - low sodium heart healthy   Complete by: As directed    Discharge instructions   Complete by: As directed    Patient will be discharged to home.  Patient will need to follow up with primary care provider within one week of discharge.  Follow up with cardiology. Patient should continue medications as prescribed.  Patient should follow a heart healthy diet.   Increase activity slowly   Complete by: As directed       Allergies as of 06/26/2021       Reactions   Beef-derived Products Anaphylaxis   From tick bite   Metoclopramide Hcl Anaphylaxis   Non responsive   Penicillins Anaphylaxis   Pt states she can take any cephalosporin (per pt. 01/01/19) Shock Has patient had a PCN reaction causing immediate rash, facial/tongue/throat swelling, SOB or lightheadedness with hypotension: Yes Has patient had a  PCN reaction causing severe rash involving mucus membranes or skin necrosis: No Has patient had a PCN reaction that required hospitalization Yes Has patient had a PCN reaction occurring within the last 10 years: No If all of the above answers are "NO", then may proceed with Cephalosporin    Pork-derived Products Anaphylaxis   From tick bite. Has tolerated heparin/lovenox   Metoprolol Other (See Comments)   Syncope   Reglan [metoclopramide]    Statins Other (See Comments)   Myalgias        Medication List     STOP taking these medications    fluticasone 50 MCG/ACT nasal spray Commonly known as: FLONASE       TAKE these medications    ALPRAZolam 0.25 MG tablet Commonly known as: XANAX TAKE 1 TABLET BY MOUTH AT BEDTIME AS NEEDED FOR ANXIETY What changed: See the new instructions.   amiodarone 200 MG tablet Commonly known as: Pacerone Take 1 tablet (200 mg total) by mouth 2 (two) times daily. Take 400 mg (2 tablets) Monday thru Friday, On Saturday and Sunday take 200 mg (1 tablet) What changed:  how much to take how to take this when to take this   carvedilol 6.25 MG tablet Commonly known as: COREG Take 12.5 mg by mouth See admin instructions. Take 1 tablet by mouth in the morning and 1 tablet every evening.   clopidogrel 75 MG tablet Commonly known as: PLAVIX Take 1 tablet (75 mg total) by mouth daily with breakfast.   DULoxetine 60 MG capsule Commonly  known as: CYMBALTA Take 60 mg by mouth every evening.   empagliflozin 10 MG Tabs tablet Commonly known as: Jardiance Take 1 tablet (10 mg total) by mouth daily before breakfast.   EPINEPHrine 0.3 mg/0.3 mL Soaj injection Commonly known as: EPI-PEN Inject 0.3 mg into the muscle as needed.   ezetimibe 10 MG tablet Commonly known as: ZETIA TAKE 1 TABLET BY MOUTH EVERY DAY   isosorbide mononitrate 30 MG 24 hr tablet Commonly known as: IMDUR Take 0.5 tablets (15 mg total) by mouth daily.   methocarbamol 750  MG tablet Commonly known as: ROBAXIN Take 1 tablet (750 mg total) by mouth every 8 (eight) hours as needed for muscle spasms.   mexiletine 200 MG capsule Commonly known as: MEXITIL Take 1 capsule (200 mg total) by mouth every 8 (eight) hours.   multivitamins ther. w/minerals Tabs tablet Take 1 tablet by mouth daily.   nitroGLYCERIN 0.4 MG SL tablet Commonly known as: NITROSTAT PLACE 1 TABLET UNDER THE TONGUE EVERY 5 MINUTES FOR 3 DOSES AS NEEDED CHEST PAIN What changed: See the new instructions.   oxyCODONE-acetaminophen 10-325 MG tablet Commonly known as: PERCOCET Take 1 tablet by mouth every 4 (four) hours as needed for pain.   pantoprazole 40 MG tablet Commonly known as: Protonix Take 1 tablet (40 mg total) by mouth daily.   potassium chloride SA 20 MEQ tablet Commonly known as: KLOR-CON Take 2 tablets (40 mEq total) by mouth daily. What changed:  how much to take when to take this additional instructions   sacubitril-valsartan 24-26 MG Commonly known as: ENTRESTO Take 1 tablet by mouth 2 (two) times daily.   sennosides-docusate sodium 8.6-50 MG tablet Commonly known as: SENOKOT-S Take 1 tablet by mouth daily.   spironolactone 25 MG tablet Commonly known as: ALDACTONE Take 1 tablet (25 mg total) by mouth daily.   torsemide 20 MG tablet Commonly known as: DEMADEX Take 60 mg by mouth daily. What changed: Another medication with the same name was removed. Continue taking this medication, and follow the directions you see here.   traZODone 100 MG tablet Commonly known as: DESYREL Take 100 mg by mouth at bedtime as needed for sleep.   warfarin 10 MG tablet Commonly known as: COUMADIN Take as directed. If you are unsure how to take this medication, talk to your nurse or doctor. Original instructions: TAKE 1/2 TABLET BY MOUTH EVERY DAY EXCEPT 1 TABLET ON SUNDAYS AND THURSDAYS What changed:  how much to take how to take this when to take this additional  instructions       Allergies  Allergen Reactions   Beef-Derived Products Anaphylaxis    From tick bite   Metoclopramide Hcl Anaphylaxis    Non responsive   Penicillins Anaphylaxis    Pt states she can take any cephalosporin (per pt. 01/01/19) Shock Has patient had a PCN reaction causing immediate rash, facial/tongue/throat swelling, SOB or lightheadedness with hypotension: Yes Has patient had a PCN reaction causing severe rash involving mucus membranes or skin necrosis: No Has patient had a PCN reaction that required hospitalization Yes Has patient had a PCN reaction occurring within the last 10 years: No If all of the above answers are "NO", then may proceed with Cephalosporin    Pork-Derived Products Anaphylaxis    From tick bite. Has tolerated heparin/lovenox   Metoprolol Other (See Comments)    Syncope    Reglan [Metoclopramide]    Statins Other (See Comments)    Myalgias    Follow-up  Information     [name], FNP. Go on 07/05/2021.   Specialty: Family Medicine Why: Hospital follow-up @10 :05am Contact information: [address] [place] [phone]         [phone], MD .   Specialty: Cardiology Contact information: [address] [place] [phone]         [phone], MD .   Specialty: Cardiology Contact information: [address] [place] [phone]                  The results of significant diagnostics from this hospitalization (including imaging, microbiology, ancillary and laboratory) are listed below for reference.    Significant Diagnostic Studies: DG Chest 1 View  Result Date: 06/23/2021 CLINICAL DATA:  Syncope. EXAM: CHEST  1 VIEW COMPARISON:  Chest x-ray dated March 01, 2021. FINDINGS: Unchanged left chest wall pacemaker. Stable mild cardiomegaly. Unchanged linear scarring in the left greater than right lungs. No focal consolidation, pleural effusion, or  pneumothorax. No acute osseous abnormality. IMPRESSION: No active disease. Electronically Signed   By: March 03, 2021 M.D.   On: 06/23/2021 17:36   CT Head Wo Contrast  Result Date: 06/23/2021 CLINICAL DATA:  Syncope, fall down stairs, history of stroke, anticoagulation EXAM: CT HEAD WITHOUT CONTRAST CT CERVICAL SPINE WITHOUT CONTRAST TECHNIQUE: Multidetector CT imaging of the head and cervical spine was performed following the standard protocol without intravenous contrast. Multiplanar CT image reconstructions of the cervical spine were also generated. COMPARISON:  None. FINDINGS: CT HEAD FINDINGS Brain: No evidence of acute infarction, hemorrhage, hydrocephalus, extra-axial collection or mass lesion/mass effect. Encephalomalacia of the high right frontal lobe (series 2, image 23). Vascular: No hyperdense vessel or unexpected calcification. Skull: Normal. Negative for fracture or focal lesion. Sinuses/Orbits: No acute finding. Other: Soft tissue hematoma of the left forehead and left occiput (series 2, image 12, 5). CT CERVICAL SPINE FINDINGS Alignment: Normal. Skull base and vertebrae: No acute fracture. No primary bone lesion or focal pathologic process. Soft tissues and spinal canal: No prevertebral fluid or swelling. No visible canal hematoma. Disc levels:  Intact. Upper chest: Negative. Other: None. IMPRESSION: 1. No acute intracranial pathology. 2. Encephalomalacia of the high right frontal lobe, in keeping with prior infarction. 3. Soft tissue hematoma of the left forehead and left occiput. 4. No fracture or static subluxation of the cervical spine. Electronically Signed   By: 06/25/2021 M.D.   On: 06/23/2021 17:51   CT Cervical Spine Wo Contrast  Result Date: 06/23/2021 CLINICAL DATA:  Syncope, fall down stairs, history of stroke, anticoagulation EXAM: CT HEAD WITHOUT CONTRAST CT CERVICAL SPINE WITHOUT CONTRAST TECHNIQUE: Multidetector CT imaging of the head and cervical spine was performed  following the standard protocol without intravenous contrast. Multiplanar CT image reconstructions of the cervical spine were also generated. COMPARISON:  None. FINDINGS: CT HEAD FINDINGS Brain: No evidence of acute infarction, hemorrhage, hydrocephalus, extra-axial collection or mass lesion/mass effect. Encephalomalacia of the high right frontal lobe (series 2, image 23). Vascular: No hyperdense vessel or unexpected calcification. Skull: Normal. Negative for fracture or focal lesion. Sinuses/Orbits: No acute finding. Other: Soft tissue hematoma of the left forehead and left occiput (series 2, image 12, 5). CT CERVICAL SPINE FINDINGS Alignment: Normal. Skull base and vertebrae: No acute fracture. No primary bone lesion or focal pathologic process. Soft tissues and spinal canal: No prevertebral fluid or swelling. No visible canal hematoma. Disc levels:  Intact. Upper chest:  Negative. Other: None. IMPRESSION: 1. No acute intracranial pathology. 2. Encephalomalacia of the high right frontal lobe, in keeping with prior infarction. 3. Soft tissue hematoma of the left forehead and left occiput. 4. No fracture or static subluxation of the cervical spine. Electronically Signed   By: Lauralyn Primes M.D.   On: 06/23/2021 17:51   CUP PACEART REMOTE DEVICE CHECK  Result Date: 06/02/2021 Scheduled remote reviewed. Normal device function.  Next remote 91 days. Hassell Halim, RN, CCDS, [employer]   Microbiology: Recent Results (from the past 240 hour(s))  Resp Panel by RT-PCR (Flu A&B, Covid) Nasopharyngeal Swab     Status: None   Collection Time: 06/23/21  9:40 PM   Specimen: Nasopharyngeal Swab; Nasopharyngeal(NP) swabs in vial transport medium  Result Value Ref Range Status   SARS Coronavirus 2 by RT PCR NEGATIVE NEGATIVE Final    Comment: (NOTE) SARS-CoV-2 target nucleic acids are NOT DETECTED.  The SARS-CoV-2 RNA is generally detectable in upper respiratory specimens during the acute phase of infection.  The lowest concentration of SARS-CoV-2 viral copies this assay can detect is 138 copies/mL. A negative result does not preclude SARS-Cov-2 infection and should not be used as the sole basis for treatment or other patient management decisions. A negative result may occur with  improper specimen collection/handling, submission of specimen other than nasopharyngeal swab, presence of viral mutation(s) within the areas targeted by this assay, and inadequate number of viral copies(<138 copies/mL). A negative result must be combined with clinical observations, patient history, and epidemiological information. The expected result is Negative.  Fact Sheet for Patients:  BloggerCourse.com  Fact Sheet for Healthcare Providers:  SeriousBroker.it  This test is no t yet approved or cleared by the [place] FDA and  has been authorized for detection and/or diagnosis of SARS-CoV-2 by FDA under an Emergency Use Authorization (EUA). This EUA will remain  in effect (meaning this test can be used) for the duration of the COVID-19 declaration under Section 564(b)(1) of the Act, 21 U.S.C.section 360bbb-3(b)(1), unless the authorization is terminated  or revoked sooner.       Influenza A by PCR NEGATIVE NEGATIVE Final   Influenza B by PCR NEGATIVE NEGATIVE Final    Comment: (NOTE) The Xpert Xpress SARS-CoV-2/FLU/RSV plus assay is intended as an aid in the diagnosis of influenza from Nasopharyngeal swab specimens and should not be used as a sole basis for treatment. Nasal washings and aspirates are unacceptable for Xpert Xpress SARS-CoV-2/FLU/RSV testing.  Fact Sheet for Patients: BloggerCourse.com  Fact Sheet for Healthcare Providers: SeriousBroker.it  This test is not yet approved or cleared by the [place] FDA and has been authorized for detection and/or diagnosis of SARS-CoV-2 by FDA  under an Emergency Use Authorization (EUA). This EUA will remain in effect (meaning this test can be used) for the duration of the COVID-19 declaration under Section 564(b)(1) of the Act, 21 U.S.C. section 360bbb-3(b)(1), unless the authorization is terminated or revoked.  Performed at [facility], [address] [place], [place] [address]      Labs: Basic Metabolic Panel: Recent Labs  Lab 06/23/21 1650 06/23/21 1856 06/24/21 0353 06/25/21 0423  NA 136  --  138 135  K 3.6  --  3.9 4.4  CL 100  --  105 100  CO2 27  --  27 29  GLUCOSE 141*  --  119* 83  BUN 24*  --  16 17  CREATININE 1.11*  --  0.81 0.99  CALCIUM  9.0  --  8.7* 8.6*  MG  --  2.3  --   --   PHOS  --  5.3*  --   --    Liver Function Tests: Recent Labs  Lab 06/23/21 1650  AST 26  ALT 27  ALKPHOS 105  BILITOT 0.4  PROT 7.4  ALBUMIN 3.8   No results for input(s): LIPASE, AMYLASE in the last 168 hours. No results for input(s): AMMONIA in the last 168 hours. CBC: Recent Labs  Lab 06/23/21 1650 06/24/21 0353 06/25/21 0423  WBC 8.3 7.3 7.3  NEUTROABS 6.3  --   --   HGB 13.9 12.1 11.7*  HCT 44.0 37.4 36.0  MCV 93.0 92.6 91.8  PLT 158 129* 133*   Cardiac Enzymes: No results for input(s): CKTOTAL, CKMB, CKMBINDEX, TROPONINI in the last 168 hours. BNP: BNP (last 3 results) Recent Labs    12/14/20 1921 02/27/21 1605 03/01/21 2125  BNP 209.0* 320.5* 194.0*    ProBNP (last 3 results) No results for input(s): PROBNP in the last 8760 hours.  CBG: No results for input(s): GLUCAP in the last 168 hours.     Signed:  Edsel Petrin  [facility] 06/26/2021, 12:16 PM

## 2021-06-26 NOTE — TOC Transition Note (Signed)
Transition of Care [facility]) - CM/SW Discharge Note   Patient Details  Name: [name] [name] MRN: [id number] Date of Birth: Jan 19, 1961  [facility]) CM/SW Contact:  Leone Haven, RN Phone Number: 06/26/2021, 11:31 AM   Clinical Narrative:    Patient is form home, she has a friend who lives with her to help her out,his name is Diplomatic Services operational officer.  She also states that another Friend Tammy helps her out also, by taking her to the MD apts sometime.  She has a walker at home.  She goes to pharmacy in [place].  She is for dc today, she has no other needs.   Final next level of care: Home/Self Care Barriers to Discharge: No Barriers Identified   Patient Goals and CMS Choice Patient states their goals for this hospitalization and ongoing recovery are:: return home   Choice offered to / list presented to : NA  Discharge Placement                       Discharge Plan and Services In-house Referral: NA Discharge Planning Services: CM Consult Post Acute Care Choice: NA            DME Agency: NA       HH Arranged: NA          Social Determinants of Health (SDOH) Interventions     Readmission Risk Interventions Readmission Risk Prevention Plan 06/26/2021  Transportation Screening Complete  Medication Review Oceanographer) Complete  PCP or Specialist appointment within 3-5 days of discharge Complete  HRI or Home Care Consult Complete  SW Recovery Care/Counseling Consult Complete  Palliative Care Screening Not Applicable  Skilled Nursing Facility Not Applicable  Some recent data might be hidden

## 2021-06-26 NOTE — Progress Notes (Signed)
ANTICOAGULATION CONSULT NOTE - follow up  Pharmacy Consult for Warfarin Indication: atrial fibrillation and anticardiolipin syndrome  Allergies  Allergen Reactions   Beef-Derived Products Anaphylaxis    From tick bite   Metoclopramide Hcl Anaphylaxis    Non responsive   Penicillins Anaphylaxis    Pt states she can take any cephalosporin (per pt. 01/01/19) Shock Has patient had a PCN reaction causing immediate rash, facial/tongue/throat swelling, SOB or lightheadedness with hypotension: Yes Has patient had a PCN reaction causing severe rash involving mucus membranes or skin necrosis: No Has patient had a PCN reaction that required hospitalization Yes Has patient had a PCN reaction occurring within the last 10 years: No If all of the above answers are "NO", then may proceed with Cephalosporin    Pork-Derived Products Anaphylaxis    From tick bite. Has tolerated heparin/lovenox   Metoprolol Other (See Comments)    Syncope    Reglan [Metoclopramide]    Statins Other (See Comments)    Myalgias    Patient Measurements: Height: 5\' 5"  (165.1 cm) Weight: 88.5 kg (195 lb 3.2 oz) IBW/kg (Calculated) : 57  Vital Signs: Temp: 97.8 F (36.6 C) (06/28 1059) Temp Source: Oral (06/28 1059) BP: 123/65 (06/28 1059) Pulse Rate: 56 (06/28 1059)  Labs: Recent Labs    06/23/21 1650 06/23/21 1856 06/24/21 0353 06/24/21 1342 06/24/21 1541 06/24/21 2149 06/25/21 0423 06/26/21 0338  HGB 13.9  --  12.1  --   --   --  11.7*  --   HCT 44.0  --  37.4  --   --   --  36.0  --   PLT 158  --  129*  --   --   --  133*  --   LABPROT 21.0*  --  21.0* 20.3*  --   --  19.8* 21.1*  INR 1.8*  --  1.8* 1.7*  --   --  1.7* 1.8*  CREATININE 1.11*  --  0.81  --   --   --  0.99  --   TROPONINIHS 22* 36*  --   --  22* 21*  --   --      Estimated Creatinine Clearance: 68.1 mL/min (by C-G formula based on SCr of 0.99 mg/dL).   Medical History: Past Medical History:  Diagnosis Date   Aneurysm of  internal carotid artery    Anti-phospholipid syndrome (HCC)    Anticardiolipin antibody positive    Chronic Coumadin   Brain aneurysm    followed by Dr. 06/28/21   Chronic combined systolic (congestive) and diastolic (congestive) heart failure (HCC)    Cocaine abuse in remission [facility])    Coronary atherosclerosis of native coronary artery    Previous invasive cardiac testing was done at a facility in [place].  Occluded diagonal, Diffuse LAD disease, Occluded Cx, and non obstructive RCA.    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 Take 1 tablet by mouth in the morning and 1 tablet every evening.   06/23/2021 at 0600   clopidogrel (PLAVIX) 75 MG tablet Take 1 tablet (75 mg total) by mouth daily with breakfast. 30 tablet 11 06/23/2021 at 0600   DULoxetine (CYMBALTA) 60 MG capsule Take 60 mg by mouth every evening.   06/22/2021   empagliflozin (JARDIANCE) 10 MG TABS tablet Take 1 tablet (10 mg total) by mouth daily before breakfast. 30 tablet 3 06/23/2021   EPINEPHrine 0.3 mg/0.3 mL IJ SOAJ injection Inject 0.3 mg into the muscle as needed.      ezetimibe (ZETIA) 10 MG tablet TAKE 1 TABLET BY MOUTH EVERY DAY 30 tablet 5 06/23/2021   isosorbide mononitrate (IMDUR) 30 MG 24 hr tablet Take 0.5 tablets (15 mg total) by mouth daily. 15 tablet 6 06/23/2021   methocarbamol (ROBAXIN) 750 MG tablet Take 750 mg by mouth every 8 (eight)  hours as needed for muscle spasms.   Past Month   mexiletine (MEXITIL) 200 MG capsule Take 1 capsule (200 mg total) by mouth every 8 (eight) hours. 90 capsule 3 06/23/2021   Multiple Vitamins-Minerals (MULTIVITAMINS THER. W/MINERALS) TABS Take 1 tablet by mouth daily.     06/22/2021   nitroGLYCERIN (NITROSTAT) 0.4 MG SL tablet PLACE 1 TABLET UNDER THE TONGUE EVERY 5 MINUTES FOR 3 DOSES AS NEEDED CHEST PAIN 25 tablet 3 06/22/2021   oxyCODONE-acetaminophen (PERCOCET) 10-325 MG tablet Take 1 tablet by mouth every 4 (four) hours as needed for pain.   06/23/2021   pantoprazole (PROTONIX) 40 MG tablet Take 1 tablet (40 mg total) by mouth daily. 30 tablet 11 06/23/2021   potassium chloride SA (KLOR-CON) 20 MEQ tablet Take 2 tablets (40 mEq total) by mouth daily. 180 tablet 1 06/23/2021   sacubitril-valsartan (ENTRESTO) 24-26 MG Take 1 tablet by mouth 2 (two) times daily. 180 tablet 3 06/23/2021   sennosides-docusate sodium (SENOKOT-S) 8.6-50 MG tablet Take 1 tablet by mouth daily.   06/22/2021   spironolactone (ALDACTONE) 25 MG tablet Take 1 tablet (25 mg total) by mouth daily. 30 tablet 3 06/22/2021   torsemide (DEMADEX) 20 MG tablet Take 60 mg by mouth daily.   06/23/2021   traZODone (DESYREL) 100 MG tablet Take 100 mg by mouth at bedtime as needed for sleep.   Past Week   warfarin (COUMADIN) 10 MG tablet TAKE 1/2 TABLET BY MOUTH EVERY DAY EXCEPT 1 TABLET ON SUNDAYS AND THURSDAYS 30 tablet 3 06/22/2021 at 1900   [DISCONTINUED] amiodarone (PACERONE) 200 MG tablet Take 400 mg (2 tablets) Monday thru Friday, On Saturday and Sunday take 200 mg (1 tablet) 145 tablet 3 06/23/2021   ALPRAZolam (XANAX) 0.25 MG tablet TAKE 1 TABLET BY MOUTH AT BEDTIME AS NEEDED FOR ANXIETY 30 tablet 0    fluticasone (FLONASE) 50 MCG/ACT nasal spray Place 1 spray into both nostrils daily as needed for allergies.  (Patient not taking: Reported on 06/23/2021)   Not Taking   torsemide 60 MG TABS Take 60 mg by mouth daily. (Patient not taking:  Reported on 06/23/2021) 30 tablet 6 Not Taking    Assessment: [name] presented to ED 6/25 after episode of syncope and LOC where she rolled down 5 stairs.  Pt on Warfarin PTA and had a closed head injury and scalp hematomas.  Warfarin dose was held 6/25 PM.  PTA warfarin dose = 5mg  daily, last taken PTA ib 6/24 @ 19:00.   INR 1.8 on admit  date 6/25.   No coumadin given on 6/25.  Pharmacy consulted to resume coumadin on 6/26.   INR is 1.8 today, subtherapeutic but is trending up. Toward goal 2-3. . CT head and CT C-spine without new acute findings at this time.  Caution with warfarin dosing due to closed head injury and scalp hematomas s/p fall prior to admit.  No acute bleeding noted.    Goal of Therapy:  INR 2-3 Monitor platelets by anticoagulation protocol: Yes   Plan:  MD is discharging to home today on  her prior to admit dose of Warfarin 5mg  daily except 10mg  every Sun and Thurs. Resume outpatient INR monitoring at [facility] 06/28/21.    Thank you for allowing pharmacy to be part of this patients care team. [facility], RPh Clinical Pharmacist Clinical phone for 06/26/2021 from 7:30-3:00 is [phone]  **Pharmacist phone directory can be found on [email or web address] listed under [facility].  06/26/2021 11:49 AM

## 2021-06-27 ENCOUNTER — Other Ambulatory Visit (HOSPITAL_COMMUNITY): Payer: Self-pay | Admitting: Cardiology

## 2021-06-27 ENCOUNTER — Telehealth: Payer: Self-pay

## 2021-06-27 ENCOUNTER — Encounter (HOSPITAL_COMMUNITY): Payer: Self-pay

## 2021-06-27 ENCOUNTER — Other Ambulatory Visit (HOSPITAL_COMMUNITY): Payer: Self-pay

## 2021-06-27 NOTE — Telephone Encounter (Signed)
Transition Care Management Unsuccessful Follow-up Telephone Call  Date of discharge and from where:  [facility] 06/26/21  Diagnosis:  syncope due to V.Fib, closed head injury   Attempts:  1st Attempt  Reason for unsuccessful TCM follow-up call:  Left voice message

## 2021-06-28 ENCOUNTER — Ambulatory Visit (INDEPENDENT_AMBULATORY_CARE_PROVIDER_SITE_OTHER): Payer: Medicare PPO | Admitting: *Deleted

## 2021-06-28 DIAGNOSIS — Z5181 Encounter for therapeutic drug level monitoring: Secondary | ICD-10-CM | POA: Diagnosis not present

## 2021-06-28 DIAGNOSIS — D6859 Other primary thrombophilia: Secondary | ICD-10-CM

## 2021-06-28 DIAGNOSIS — D6861 Antiphospholipid syndrome: Secondary | ICD-10-CM

## 2021-06-28 DIAGNOSIS — I63549 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: Secondary | ICD-10-CM

## 2021-06-28 LAB — POCT INR: INR: 2 (ref 2.0–3.0)

## 2021-06-28 NOTE — Patient Instructions (Signed)
Continue warfarin 1/2 tablet daily  Recheck in 1 wk

## 2021-06-29 ENCOUNTER — Encounter (HOSPITAL_COMMUNITY): Payer: Self-pay

## 2021-06-29 ENCOUNTER — Other Ambulatory Visit (HOSPITAL_COMMUNITY): Payer: Self-pay | Admitting: Family Medicine

## 2021-06-29 MED ORDER — ALPRAZOLAM 0.25 MG PO TABS: ORAL_TABLET | ORAL | 0 refills | Status: DC

## 2021-06-29 NOTE — Telephone Encounter (Signed)
Transition Care Management Follow-up Telephone Call Date of discharge and from where: 06/26/21 [facility] Diagnosis:  Syncope due to V.Fib/ closed head injury How have you been since you were released from the hospital? She is fine, just anxious, feels like she has some PTSD now - scared it will happen again and she'll get hurt or not make it next time Any questions or concerns? No  Items Reviewed: Did the pt receive and understand the discharge instructions provided? Yes  Medications obtained and verified? Yes  Other? No  Any new allergies since your discharge? No  Dietary orders reviewed? Yes Do you have support at home? Yes   Home Care and Equipment/Supplies: Were home health services ordered? no  Were any new equipment or medical supplies ordered?  No  Functional Questionnaire: (I = Independent and D = Dependent) ADLs: I  Bathing/Dressing- I  Meal Prep- I  Eating- I  Maintaining continence- I  Transferring/Ambulation- I  Managing Meds- I  Follow up appointments reviewed:  PCP Hospital f/u appt confirmed? Yes  Scheduled to see [name] on 7/12 @ 11. Specialist Hospital f/u appt confirmed? Yes  Scheduled to see cardiology on 7/25 Are transportation arrangements needed? No  If their condition worsens, is the pt aware to call PCP or go to the Emergency Dept.? Yes Was the patient provided with contact information for the PCP's office or ED? Yes Was to pt encouraged to call back with questions or concerns? Yes'

## 2021-07-03 LAB — AMIODARONE LEVEL
Amiodarone Lvl: 699 ng/mL — ABNORMAL LOW (ref 1000–2500)
N-Desethyl-Amiodarone: 685 ng/mL

## 2021-07-05 ENCOUNTER — Ambulatory Visit: Payer: Medicare PPO | Admitting: Family Medicine

## 2021-07-05 ENCOUNTER — Ambulatory Visit (INDEPENDENT_AMBULATORY_CARE_PROVIDER_SITE_OTHER): Payer: Medicare PPO | Admitting: *Deleted

## 2021-07-05 ENCOUNTER — Other Ambulatory Visit: Payer: Self-pay

## 2021-07-05 ENCOUNTER — Telehealth: Payer: Self-pay

## 2021-07-05 DIAGNOSIS — D6861 Antiphospholipid syndrome: Secondary | ICD-10-CM

## 2021-07-05 DIAGNOSIS — D6859 Other primary thrombophilia: Secondary | ICD-10-CM | POA: Diagnosis not present

## 2021-07-05 DIAGNOSIS — Z7901 Long term (current) use of anticoagulants: Secondary | ICD-10-CM | POA: Diagnosis not present

## 2021-07-05 DIAGNOSIS — Z5181 Encounter for therapeutic drug level monitoring: Secondary | ICD-10-CM | POA: Diagnosis not present

## 2021-07-05 DIAGNOSIS — I63549 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: Secondary | ICD-10-CM

## 2021-07-05 LAB — POCT INR: INR: 3.2 — AB (ref 2.0–3.0)

## 2021-07-05 NOTE — Telephone Encounter (Signed)
Called pt to reschedule hospital f/u. lmtcb

## 2021-07-05 NOTE — Patient Instructions (Signed)
On amiodarone 200mg  twice a day Hold warfarin tonight then resume 1/2 tablet daily  Recheck in 1 wk

## 2021-07-08 ENCOUNTER — Encounter: Payer: Self-pay | Admitting: Family Medicine

## 2021-07-08 DIAGNOSIS — G8929 Other chronic pain: Secondary | ICD-10-CM | POA: Insufficient documentation

## 2021-07-09 ENCOUNTER — Ambulatory Visit: Payer: Medicare PPO

## 2021-07-10 ENCOUNTER — Other Ambulatory Visit: Payer: Self-pay

## 2021-07-10 ENCOUNTER — Ambulatory Visit (INDEPENDENT_AMBULATORY_CARE_PROVIDER_SITE_OTHER): Payer: Medicare PPO | Admitting: Family Medicine

## 2021-07-10 ENCOUNTER — Encounter: Payer: Self-pay | Admitting: Family Medicine

## 2021-07-10 VITALS — BP 111/65 | HR 58 | Temp 97.5°F | Ht 65.0 in | Wt 196.0 lb

## 2021-07-10 DIAGNOSIS — I472 Ventricular tachycardia: Secondary | ICD-10-CM | POA: Diagnosis not present

## 2021-07-10 DIAGNOSIS — F4310 Post-traumatic stress disorder, unspecified: Secondary | ICD-10-CM

## 2021-07-10 DIAGNOSIS — I4729 Other ventricular tachycardia: Secondary | ICD-10-CM

## 2021-07-10 DIAGNOSIS — M1711 Unilateral primary osteoarthritis, right knee: Secondary | ICD-10-CM

## 2021-07-10 MED ORDER — HYDROXYZINE HCL 10 MG PO TABS: 10.0000 mg | ORAL_TABLET | Freq: Three times a day (TID) | ORAL | 2 refills | Status: DC | PRN

## 2021-07-10 MED ORDER — METHYLPREDNISOLONE ACETATE 40 MG/ML IJ SUSP
40.0000 mg | Freq: Once | INTRAMUSCULAR | Status: AC
  Administered 2021-07-10: 40 mg via INTRAMUSCULAR

## 2021-07-10 NOTE — Progress Notes (Signed)
Assessment & Plan:  1. Paroxysmal VT (HCC) To keep appointments with cardiology. No further episodes.  2. PTSD (post-traumatic stress disorder) Uncontrolled. Started Atarax as needed. - hydrOXYzine (ATARAX/VISTARIL) 10 MG tablet; Take 1 tablet (10 mg total) by mouth 3 (three) times daily as needed.  Dispense: 30 tablet; Refill: 2  3. Primary osteoarthritis of right knee - Joint Injection/Arthrocentesis   Return as scheduled.  [name], MSN, APRN, FNP-C [facility]  Subjective:    Patient ID: [name] [name], female    DOB: 1961-12-21, 60 y.o.   MRN: [id number]  Patient Care Team: [name], FNP as PCP - General (Family Medicine) [name], MD as PCP - Cardiology (Cardiology) [name], MD as PCP - Electrophysiology (Cardiology) Beryle Beams, MD as Consulting Physician (Neurology) [name], MD as Consulting Physician (Cardiology) Ladene Artist, MD as Consulting Physician (Oncology)   Chief Complaint:  Chief Complaint  Patient presents with   Transitions Of Care    [facility] 06/23/21 Syncope, Vfib      HPI: [name] [name] is a 60 y.o. female presenting on 07/10/2021 for Transitions Of Care [facility] 06/23/21 Syncope, Vfib /)  Patient was admitted to [facility] 06/23/2021-06/26/2021 after an episode of syncope and loss of consciousness. Her AICD/Pacemaker showed multiple episodes of V-Fib and V-Tach; she was shocked once during this episode. She was started on amiodarone 200 mg twice daily. CT of head and neck due to neck pain and closed head injury without acute findings.   Patient reports today she is having PTSD from all of this.  She does have a prescription for Xanax 0.25 mg that she takes at bedtime to calm her down.  She states anytime her heart starts doing something different she starts freaking out thinking that this is going to happen again.  New complaints: Patient reports her right knee has really been bothering her  recently.  She used to get steroid shots in her knee, but has not had one in over 6 months.  States she was told her neck step would be the gel injections, which would cost her around $900 to complete.  Her knee x-ray from 2020 shows tricompartmental degenerative joint disease.   Social history:  Relevant past medical, surgical, family and social history reviewed and updated as indicated. Interim medical history since our last visit reviewed.  Allergies and medications reviewed and updated.  DATA REVIEWED: CHART IN EPIC  ROS: Negative unless specifically indicated above in HPI.    Current Outpatient Medications:    ALPRAZolam (XANAX) 0.25 MG tablet, TAKE 1 TABLET BY MOUTH AT BEDTIME AS NEEDED FOR ANXIETY, Disp: 30 tablet, Rfl: 0   amiodarone (PACERONE) 200 MG tablet, Take 1 tablet (200 mg total) by mouth 2 (two) times daily. Take 400 mg (2 tablets) Monday thru Friday, On Saturday and Sunday take 200 mg (1 tablet), Disp: 60 tablet, Rfl: 1   carvedilol (COREG) 6.25 MG tablet, Take 12.5 mg by mouth See admin instructions. Take 1 tablet by mouth in the morning and 1 tablet every evening., Disp: , Rfl:    clopidogrel (PLAVIX) 75 MG tablet, Take 1 tablet (75 mg total) by mouth daily with breakfast., Disp: 30 tablet, Rfl: 11   DULoxetine (CYMBALTA) 60 MG capsule, Take 60 mg by mouth every evening., Disp: , Rfl:    empagliflozin (JARDIANCE) 10 MG TABS tablet, Take 1 tablet (10 mg total) by mouth daily before breakfast., Disp: 30 tablet, Rfl: 3  EPINEPHrine 0.3 mg/0.3 mL IJ SOAJ injection, Inject 0.3 mg into the muscle as needed., Disp: , Rfl:    ezetimibe (ZETIA) 10 MG tablet, TAKE 1 TABLET BY MOUTH EVERY DAY, Disp: 30 tablet, Rfl: 5   isosorbide mononitrate (IMDUR) 30 MG 24 hr tablet, Take 0.5 tablets (15 mg total) by mouth daily., Disp: 15 tablet, Rfl: 6   methocarbamol (ROBAXIN) 750 MG tablet, Take 1 tablet (750 mg total) by mouth every 8 (eight) hours as needed for muscle spasms., Disp: 45  tablet, Rfl: 0   mexiletine (MEXITIL) 200 MG capsule, Take 1 capsule (200 mg total) by mouth every 8 (eight) hours., Disp: 90 capsule, Rfl: 3   Multiple Vitamins-Minerals (MULTIVITAMINS THER. W/MINERALS) TABS, Take 1 tablet by mouth daily.  , Disp: , Rfl:    nitroGLYCERIN (NITROSTAT) 0.4 MG SL tablet, PLACE 1 TABLET UNDER THE TONGUE EVERY 5 MINUTES FOR 3 DOSES AS NEEDED CHEST PAIN, Disp: 25 tablet, Rfl: 3   oxyCODONE-acetaminophen (PERCOCET) 10-325 MG tablet, Take 1 tablet by mouth every 4 (four) hours as needed for pain., Disp: , Rfl:    pantoprazole (PROTONIX) 40 MG tablet, Take 1 tablet (40 mg total) by mouth daily., Disp: 30 tablet, Rfl: 11   potassium chloride SA (KLOR-CON) 20 MEQ tablet, Take 2 tablets (40 mEq total) by mouth daily., Disp: 180 tablet, Rfl: 1   sacubitril-valsartan (ENTRESTO) 24-26 MG, Take 1 tablet by mouth 2 (two) times daily., Disp: 180 tablet, Rfl: 3   sennosides-docusate sodium (SENOKOT-S) 8.6-50 MG tablet, Take 1 tablet by mouth daily., Disp: , Rfl:    spironolactone (ALDACTONE) 25 MG tablet, Take 1 tablet (25 mg total) by mouth daily., Disp: 30 tablet, Rfl: 3   torsemide (DEMADEX) 20 MG tablet, Take 60 mg by mouth daily., Disp: , Rfl:    traZODone (DESYREL) 100 MG tablet, Take 100 mg by mouth at bedtime as needed for sleep., Disp: , Rfl:    warfarin (COUMADIN) 10 MG tablet, TAKE 1/2 TABLET BY MOUTH EVERY DAY EXCEPT 1 TABLET ON SUNDAYS AND THURSDAYS, Disp: 30 tablet, Rfl: 3   Allergies  Allergen Reactions   Beef-Derived Products Anaphylaxis    From tick bite   Metoclopramide Hcl Anaphylaxis    Non responsive   Penicillins Anaphylaxis    Pt states she can take any cephalosporin (per pt. 01/01/19) Shock Has patient had a PCN reaction causing immediate rash, facial/tongue/throat swelling, SOB or lightheadedness with hypotension: Yes Has patient had a PCN reaction causing severe rash involving mucus membranes or skin necrosis: No Has patient had a PCN reaction that  required hospitalization Yes Has patient had a PCN reaction occurring within the last 10 years: No If all of the above answers are "NO", then may proceed with Cephalosporin    Pork-Derived Products Anaphylaxis    From tick bite. Has tolerated heparin/lovenox   Metoprolol Other (See Comments)    Syncope    Reglan [Metoclopramide]    Statins Other (See Comments)    Myalgias   Past Medical History:  Diagnosis Date   Aneurysm of internal carotid artery    Anti-phospholipid syndrome (HCC)    Anticardiolipin antibody positive    Chronic Coumadin   Brain aneurysm    followed by Dr. Corliss Skains   Chronic combined systolic (congestive) and diastolic (congestive) heart failure (HCC)    Cocaine abuse in remission [facility])    Coronary atherosclerosis of native coronary artery    Previous invasive cardiac testing was done at a facility in [facility].  Occluded diagonal, Diffuse LAD disease, Occluded Cx, and non obstructive RCA.    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 Dutch Quint L1 cage, 5 disc fusion   CARDIAC DEFIBRILLATOR PLACEMENT  2008   [employer] ICD   CARDIOVERSION N/A 12/01/2020   Procedure: CARDIOVERSION;  Surgeon: [name], MD;  Location: [facility] ENDOSCOPY;  Service: Cardiovascular;  Laterality: N/A;   CORONARY STENT  INTERVENTION N/A 01/08/2021   Procedure: CORONARY STENT INTERVENTION;  Surgeon: Swaz[place], Peter M, MD;  Location: [facility] INVASIVE CV LAB;  Service: Cardiovascular;  Laterality: N/A;   EP IMPLANTABLE DEVICE N/A 03/01/2016   Procedure: ICD Generator Changeout;  Surgeon: [name], MD;  Location: [facility] INVASIVE CV LAB;  Service: Cardiovascular;  Laterality: N/A;   ICD/BIV ICD FUNCTION(DFT) TEST N/A 03/14/2021   Procedure: ICD/BIV ICD FUNCTION (DFT) TEST;  Surgeon: [name], MD;  Location: [facility] INVASIVE CV LAB;  Service: Cardiovascular;  Laterality: N/A;   L1 corpectomy   11/29/2009   Laryngeal polyp excision     RIGHT/LEFT HEART CATH AND CORONARY ANGIOGRAPHY N/A 12/18/2020   Procedure: RIGHT/LEFT HEART CATH AND CORONARY ANGIOGRAPHY;  Surgeon: Dolores Patty, MD;  Location: [facility] INVASIVE CV LAB;  Service: Cardiovascular;  Laterality: N/A;   RIGHT/LEFT HEART CATH AND CORONARY ANGIOGRAPHY N/A 03/05/2021   Procedure: RIGHT/LEFT HEART CATH AND CORONARY ANGIOGRAPHY;  Surgeon: [name], MD;  Location: [facility] INVASIVE CV LAB;  Service: Cardiovascular;  Laterality: N/A;   TEE WITHOUT CARDIOVERSION N/A 12/01/2020   Procedure: TRANSESOPHAGEAL ECHOCARDIOGRAM (TEE);  Surgeon: [name], MD;  Location: [facility] ENDOSCOPY;  Service: Cardiovascular;  Laterality: N/AFloyce Stakes ABLATION N/A 03/08/2021   Procedure: Floyce Stakes ABLATION;  Surgeon: Lanier Prude, MD;  Location: [facility] INVASIVE CV LAB;  Service: Cardiovascular;  Laterality: N/A;    Social History   Socioeconomic History   Marital status: Single    Spouse name: Not on file   Number of children: Not on file   Years of education: Not on file   Highest education level: Not on file  Occupational History   Occupation: Disabled    Comment: ESL  Tobacco Use   Smoking status: Some Days    Packs/day: 0.25    Years: 43.00    Pack years: 10.75    Types: Cigarettes, E-cigarettes    Start date: 03/03/1978   Smokeless tobacco: Never   Tobacco  comments:    4 ciggs per day  Vaping Use   Vaping Use: Every day  Substance and Sexual Activity   Alcohol use: No    Alcohol/week: 0.0 standard drinks   Drug use: No    Comment: Prior history of cocaine   Sexual activity: Not Currently    Birth control/protection: None  Other Topics Concern   Not on file  Social History Narrative   Not on file   Social Determinants of Health   Financial Resource Strain: Low Risk    Difficulty of Paying Living Expenses: Not very hard  Food Insecurity: No Food Insecurity   Worried About Running Out of Food in the Last Year: Never true   Ran Out of Food in the Last Year: Never true  Transportation Needs: No Transportation Needs   Lack of Transportation (Medical): No   Lack of Transportation (Non-Medical): No  Physical Activity: Not on file  Stress: Not on file  Social Connections: Not on file  Intimate Partner Violence: Not on file        Objective:    BP 111/65   Pulse (!) 58   Temp (!) 97.5 F (36.4 C) (Temporal)   Ht 5\' 5"  (1.651 m)   Wt 196 lb (88.9 kg)   SpO2 99%   BMI 32.62 kg/m   Wt Readings from Last 3 Encounters:  07/10/21 196 lb (88.9 kg)  06/26/21 195 lb 3.2 oz (88.5 kg)  05/22/21 194 lb (88 kg)    Physical Exam Vitals reviewed.  Constitutional:      General: She is not in acute distress.    Appearance: Normal appearance. She is obese. She is not ill-appearing, toxic-appearing or diaphoretic.  HENT:     Head: Normocephalic and atraumatic.  Eyes:     General: No scleral icterus.       Right eye: No discharge.        Left eye: No discharge.     Conjunctiva/sclera: Conjunctivae normal.  Cardiovascular:     Rate and Rhythm: Normal rate and regular rhythm.     Heart sounds: Normal heart sounds. No murmur heard.   No friction rub. No gallop.  Pulmonary:     Effort: Pulmonary effort is normal. No respiratory distress.     Breath sounds: Normal breath sounds. No stridor. No wheezing, rhonchi or rales.   Musculoskeletal:        General: Normal range of motion.     Cervical back: Normal range of motion.     Right knee: No deformity, effusion, erythema or lacerations. Tenderness present over the lateral joint line. No LCL laxity, MCL laxity, ACL laxity or PCL laxity. Normal alignment, normal meniscus and normal patellar mobility. Normal pulse.  Skin:    General: Skin is warm and dry.     Capillary Refill: Capillary refill takes less than 2 seconds.  Neurological:     General: No focal deficit present.     Mental Status: She is alert and oriented to person, place, and time. Mental status is at baseline.  Psychiatric:        Mood and Affect: Mood normal.        Behavior: Behavior normal.        Thought Content: Thought content normal.        Judgment: Judgment normal.   Joint Injection/Arthrocentesis  Date/Time: 07/10/2021 11:45 AM Performed by: 09/10/2021, FNP Authorized by: [name], FNP  Indications: pain  Body area: knee Joint: right knee  Sedation: Patient sedated: no  Preparation: Patient was prepped and draped in the usual sterile fashion. Ultrasound guidance: no Approach: medial Aspirate amount: 0 mL Methylprednisolone amount: 40 mg Lidocaine 1% amount: 3 mL Patient tolerance: patient tolerated the procedure well with no immediate complications     Lab Results  Component Value Date   TSH 3.401  06/23/2021   Lab Results  Component Value Date   WBC 7.3 06/25/2021   HGB 11.7 (L) 06/25/2021   HCT 36.0 06/25/2021   MCV 91.8 06/25/2021   PLT 133 (L) 06/25/2021   Lab Results  Component Value Date   NA 135 06/25/2021   K 4.4 06/25/2021   CO2 29 06/25/2021   GLUCOSE 83 06/25/2021   BUN 17 06/25/2021   CREATININE 0.99 06/25/2021   BILITOT 0.4 06/23/2021   ALKPHOS 105 06/23/2021   AST 26 06/23/2021   ALT 27 06/23/2021   PROT 7.4 06/23/2021   ALBUMIN 3.8 06/23/2021   CALCIUM 8.6 (L) 06/25/2021   ANIONGAP 6 06/25/2021   Lab Results  Component  Value Date   CHOL 120 12/16/2020   Lab Results  Component Value Date   HDL 31 (L) 12/16/2020   Lab Results  Component Value Date   LDLCALC 72 12/16/2020   Lab Results  Component Value Date   TRIG 85 12/16/2020   Lab Results  Component Value Date   CHOLHDL 3.9 12/16/2020   Lab Results  Component Value Date   HGBA1C 5.2 05/22/2021

## 2021-07-11 ENCOUNTER — Ambulatory Visit (INDEPENDENT_AMBULATORY_CARE_PROVIDER_SITE_OTHER): Payer: Medicare PPO | Admitting: *Deleted

## 2021-07-11 DIAGNOSIS — D6859 Other primary thrombophilia: Secondary | ICD-10-CM | POA: Diagnosis not present

## 2021-07-11 DIAGNOSIS — Z7901 Long term (current) use of anticoagulants: Secondary | ICD-10-CM

## 2021-07-11 DIAGNOSIS — D6861 Antiphospholipid syndrome: Secondary | ICD-10-CM | POA: Diagnosis not present

## 2021-07-11 DIAGNOSIS — I63549 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: Secondary | ICD-10-CM

## 2021-07-11 DIAGNOSIS — Z5181 Encounter for therapeutic drug level monitoring: Secondary | ICD-10-CM

## 2021-07-11 LAB — POCT INR: INR: 2.3 (ref 2.0–3.0)

## 2021-07-11 NOTE — Patient Instructions (Signed)
On amiodarone 200mg  twice a day Continue warfarin 1/2 tablet daily  Recheck in 3 wk

## 2021-07-12 ENCOUNTER — Emergency Department (HOSPITAL_COMMUNITY)
Admission: EM | Admit: 2021-07-12 | Discharge: 2021-07-12 | Disposition: A | Discharge status: Home / Self Care | Payer: Medicare PPO | Attending: Emergency Medicine | Admitting: Emergency Medicine

## 2021-07-12 ENCOUNTER — Emergency Department (HOSPITAL_COMMUNITY): Payer: Medicare PPO

## 2021-07-12 ENCOUNTER — Telehealth: Payer: Self-pay

## 2021-07-12 ENCOUNTER — Encounter (HOSPITAL_COMMUNITY): Payer: Self-pay | Admitting: Emergency Medicine

## 2021-07-12 DIAGNOSIS — I472 Ventricular tachycardia, unspecified: Secondary | ICD-10-CM

## 2021-07-12 DIAGNOSIS — Y840 Cardiac catheterization as the cause of abnormal reaction of the patient, or of later complication, without mention of misadventure at the time of the procedure: Secondary | ICD-10-CM | POA: Insufficient documentation

## 2021-07-12 DIAGNOSIS — I5042 Chronic combined systolic (congestive) and diastolic (congestive) heart failure: Secondary | ICD-10-CM | POA: Diagnosis not present

## 2021-07-12 DIAGNOSIS — R079 Chest pain, unspecified: Secondary | ICD-10-CM | POA: Diagnosis not present

## 2021-07-12 DIAGNOSIS — Z7902 Long term (current) use of antithrombotics/antiplatelets: Secondary | ICD-10-CM | POA: Diagnosis not present

## 2021-07-12 DIAGNOSIS — I4819 Other persistent atrial fibrillation: Secondary | ICD-10-CM | POA: Diagnosis not present

## 2021-07-12 DIAGNOSIS — Z79899 Other long term (current) drug therapy: Secondary | ICD-10-CM | POA: Insufficient documentation

## 2021-07-12 DIAGNOSIS — R0789 Other chest pain: Secondary | ICD-10-CM | POA: Diagnosis not present

## 2021-07-12 DIAGNOSIS — I517 Cardiomegaly: Secondary | ICD-10-CM | POA: Diagnosis not present

## 2021-07-12 DIAGNOSIS — F17210 Nicotine dependence, cigarettes, uncomplicated: Secondary | ICD-10-CM | POA: Diagnosis not present

## 2021-07-12 DIAGNOSIS — Z7901 Long term (current) use of anticoagulants: Secondary | ICD-10-CM | POA: Diagnosis not present

## 2021-07-12 DIAGNOSIS — N9489 Other specified conditions associated with female genital organs and menstrual cycle: Secondary | ICD-10-CM | POA: Diagnosis not present

## 2021-07-12 DIAGNOSIS — I2510 Atherosclerotic heart disease of native coronary artery without angina pectoris: Secondary | ICD-10-CM | POA: Diagnosis not present

## 2021-07-12 DIAGNOSIS — I110 Hypertensive heart disease with heart failure: Secondary | ICD-10-CM | POA: Insufficient documentation

## 2021-07-12 DIAGNOSIS — T82118A Breakdown (mechanical) of other cardiac electronic device, initial encounter: Secondary | ICD-10-CM | POA: Insufficient documentation

## 2021-07-12 LAB — CBC
HCT: 44.5 % (ref 36.0–46.0)
Hemoglobin: 13.9 g/dL (ref 12.0–15.0)
MCH: 29.1 pg (ref 26.0–34.0)
MCHC: 31.2 g/dL (ref 30.0–36.0)
MCV: 93.1 fL (ref 80.0–100.0)
Platelets: 165 10*3/uL (ref 150–400)
RBC: 4.78 MIL/uL (ref 3.87–5.11)
RDW: 14 % (ref 11.5–15.5)
WBC: 6.3 10*3/uL (ref 4.0–10.5)
nRBC: 0 % (ref 0.0–0.2)

## 2021-07-12 LAB — I-STAT CHEM 8, ED
BUN: 19 mg/dL (ref 6–20)
Calcium, Ion: 1.1 mmol/L — ABNORMAL LOW (ref 1.15–1.40)
Chloride: 97 mmol/L — ABNORMAL LOW (ref 98–111)
Creatinine, Ser: 1.1 mg/dL — ABNORMAL HIGH (ref 0.44–1.00)
Glucose, Bld: 86 mg/dL (ref 70–99)
HCT: 47 % — ABNORMAL HIGH (ref 36.0–46.0)
Hemoglobin: 16 g/dL — ABNORMAL HIGH (ref 12.0–15.0)
Potassium: 3.8 mmol/L (ref 3.5–5.1)
Sodium: 138 mmol/L (ref 135–145)
TCO2: 30 mmol/L (ref 22–32)

## 2021-07-12 LAB — BASIC METABOLIC PANEL
Anion gap: 12 (ref 5–15)
BUN: 16 mg/dL (ref 6–20)
CO2: 27 mmol/L (ref 22–32)
Calcium: 8.7 mg/dL — ABNORMAL LOW (ref 8.9–10.3)
Chloride: 97 mmol/L — ABNORMAL LOW (ref 98–111)
Creatinine, Ser: 1 mg/dL (ref 0.44–1.00)
GFR, Estimated: >60 mL/min (ref >60)
Glucose, Bld: 89 mg/dL (ref 70–99)
Potassium: 3.7 mmol/L (ref 3.5–5.1)
Sodium: 136 mmol/L (ref 135–145)

## 2021-07-12 LAB — MAGNESIUM: Magnesium: 2 mg/dL (ref 1.7–2.4)

## 2021-07-12 LAB — I-STAT BETA HCG BLOOD, ED (MC, WL, AP ONLY): I-stat hCG, quantitative: 9.4 m[IU]/mL — ABNORMAL HIGH (ref <5)

## 2021-07-12 LAB — PROTIME-INR
INR: 2 — ABNORMAL HIGH (ref 0.8–1.2)
Prothrombin Time: 22.8 seconds — ABNORMAL HIGH (ref 11.4–15.2)

## 2021-07-12 LAB — TROPONIN I (HIGH SENSITIVITY)
Troponin I (High Sensitivity): 16 ng/L
Troponin I (High Sensitivity): 15 ng/L (ref <18)

## 2021-07-12 LAB — PREGNANCY, URINE: Preg Test, Ur: NEGATIVE

## 2021-07-12 MED ORDER — OXYCODONE-ACETAMINOPHEN 5-325 MG PO TABS
2.0000 | ORAL_TABLET | Freq: Once | ORAL | Status: AC
  Administered 2021-07-12: 2 via ORAL
  Filled 2021-07-12: qty 2

## 2021-07-12 MED ORDER — ALPRAZOLAM 0.25 MG PO TABS
0.2500 mg | ORAL_TABLET | Freq: Once | ORAL | Status: AC
  Administered 2021-07-12: 0.25 mg via ORAL
  Filled 2021-07-12: qty 1

## 2021-07-12 MED ORDER — MEXILETINE HCL 200 MG PO CAPS
200.0000 mg | ORAL_CAPSULE | Freq: Once | ORAL | Status: AC
  Administered 2021-07-12: 200 mg via ORAL
  Filled 2021-07-12: qty 1

## 2021-07-12 NOTE — Discharge Instructions (Addendum)
You have been seen and discharged from the emergency department.  Your device is functioning appropriately, continue to take all your medications as prescribed.  Follow-up with your cardiologist and primary provider for reevaluation and further care. Take home medications as prescribed. If you have any worsening symptoms or further concerns for your health please return to an emergency department for further evaluation.

## 2021-07-12 NOTE — ED Provider Notes (Signed)
[facility] EMERGENCY DEPARTMENT Provider Note   CSN: [id number] Arrival date & time: 07/12/21  1048     History Chief Complaint  Patient presents with   AICD Problem    [name] [name] is a 60 y.o. female.  HPI  60 year old female with a complicated past medical history including HTN, HLD, previous CVA, CAD with cardiomyopathy and AICD in place presents to the emergency department with concern for arrhythmias on her pacemaker interrogation.  Patient has been admitted before with runs of V. tach and V. fib.  She follows with Dr. Ladona Ridgel for cardiology.  She states that her parameters have been changed multiple times.  Over the last week she has been having intermittent episodes of chest tightness, she feels as if the pacemaker is pacing with a previous shock.  She was called at 7 AM this morning from the company stating that she had an episode of V. tach so she came in for evaluation.  Currently she is chest pain-free.  Denies any shortness of breath.  No swelling of her lower extremities.  She is otherwise been compliant with her medications including amiodarone.  Past Medical History:  Diagnosis Date   Aneurysm of internal carotid artery    Anti-phospholipid syndrome (HCC)    Anticardiolipin antibody positive    Chronic Coumadin   Brain aneurysm    followed by Dr. Corliss Skains   Chronic combined systolic (congestive) and diastolic (congestive) heart failure (HCC)    Cocaine abuse in remission [facility])    Coronary atherosclerosis of native coronary artery    Previous invasive cardiac testing was done at a facility in [place], [place].  Occluded diagonal, Diffuse LAD disease, Occluded Cx, and non obstructive RCA.    Essential hypertension    Headache    History of pneumonia    ICD (implantable cardioverter-defibrillator) in place    Ischemic cardiomyopathy    LVEF 30-35%   Mixed hyperlipidemia    NSVT (nonsustained ventricular tachycardia) (HCC)    Persistent  atrial fibrillation (HCC)    PVD (peripheral vascular disease) (HCC)    Raynaud's phenomenon    Statin intolerance    Stroke [facility]) 2007   Total occlusion of the right internal carotid    Patient Active Problem List   Diagnosis Date Noted   Chronic pain 07/08/2021   Syncope 06/23/2021   Postmenopausal 05/23/2021   Allergy to alpha-gal 05/23/2021   Insomnia 02/09/2021   Unstable angina (HCC) 01/11/2021   Defibrillator discharge    CAD in native artery    Persistent atrial fibrillation (HCC) 12/15/2020   Prediabetes 12/15/2020   Ventricular tachycardia (HCC) 12/15/2020   Right carotid artery occlusion    GERD (gastroesophageal reflux disease) 08/11/2020   Allergic rhinitis 08/11/2020   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   Tobacco dependence 06/18/2017   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)    Left-sided weakness    Chronic combined systolic (congestive) and diastolic (congestive) heart failure (HCC) 07/26/2015   Encounter for therapeutic drug monitoring 02/02/2014   Dual implantable cardioverter-defibrillator in situ 06/01/2012   Long term current use of anticoagulant 03/22/2011   Essential hypertension, benign 05/29/2010   Hyperlipidemia 09/26/2009   Peripheral vascular disease (HCC) 06/23/2009   Cardiomyopathy, ischemic 11/29/2008   CEREBROVASCULAR DISEASE 11/29/2008   Anticardiolipin syndrome (HCC) 11/29/2008    Past Surgical History:  Procedure Laterality Date   ABDOMINAL AORTAGRAM N/A 05/25/2014   Procedure: ABDOMINAL Ronny Flurry;  Surgeon: Iran Ouch,  MD;  Location: [facility] CATH LAB;  Service: Cardiovascular;  Laterality: N/A;   APPENDECTOMY     BACK SURGERY  2010   Dr. Dutch QuintPoole L1 cage, 5 disc fusion   CARDIAC DEFIBRILLATOR PLACEMENT  2008   [employer] ICD   CARDIOVERSION N/A 12/01/2020   Procedure: CARDIOVERSION;  Surgeon: Laurey MoraleMcLean, Dalton S, MD;  [facility];  Service: Cardiovascular;   Laterality: N/A;   CORONARY STENT INTERVENTION N/A 01/08/2021   Procedure: CORONARY STENT INTERVENTION;  Surgeon: SwazilandJordan, Peter M, MD;  [facility];  Service: Cardiovascular;  Laterality: N/A;   EP IMPLANTABLE DEVICE N/A 03/01/2016   Procedure: ICD Generator Changeout;  Surgeon: Marinus MawGregg W Taylor, MD;  [facility];  Service: Cardiovascular;  Laterality: N/A;   ICD/BIV ICD FUNCTION(DFT) TEST N/A 03/14/2021   Procedure: ICD/BIV ICD FUNCTION (DFT) TEST;  Surgeon: Marinus Mawaylor, Gregg W, MD;  Location: [facility] INVASIVE CV LAB;  Service: Cardiovascular;  Laterality: N/A;   L1 corpectomy   11/29/2009   Laryngeal polyp excision     RIGHT/LEFT HEART CATH AND CORONARY ANGIOGRAPHY N/A 12/18/2020   Procedure: RIGHT/LEFT HEART CATH AND CORONARY ANGIOGRAPHY;  Surgeon: Dolores PattyBensimhon, Daniel R, MD;  Location: [facility] INVASIVE CV LAB;  Service: Cardiovascular;  Laterality: N/A;   RIGHT/LEFT HEART CATH AND CORONARY ANGIOGRAPHY N/A 03/05/2021   Procedure: RIGHT/LEFT HEART CATH AND CORONARY ANGIOGRAPHY;  Surgeon: Laurey MoraleMcLean, Dalton S, MD;  [facility];  Service: Cardiovascular;  Laterality: N/A;   TEE WITHOUT CARDIOVERSION N/A 12/01/2020   Procedure: TRANSESOPHAGEAL ECHOCARDIOGRAM (TEE);  Surgeon: Laurey MoraleMcLean, Dalton S, MD;  [facility];  Service: Cardiovascular;  Laterality: N/AFloyce Stakes;   V TACH ABLATION N/A 03/08/2021   Procedure: Floyce StakesV TACH ABLATION;  Surgeon: Lanier PrudeLambert, Cameron T, MD;  Location: [facility] INVASIVE CV LAB;  Service: Cardiovascular;  Laterality: N/A;     OB History   No obstetric history on file.     Family History  Problem Relation Age of Onset   Uterine cancer Mother    Heart disease Father [id number]   Hyperlipidemia Father    Hypertension Father    Ankylosing spondylitis Sister    Heart disease Sister    Hypertension Sister    Stomach cancer Brother [id number]   Heart attack Brother     Social History   Tobacco Use   Smoking status: Some Days    Packs/day: 0.25    Years: 43.00     Pack years: 10.75    Types: Cigarettes, E-cigarettes    Start date: 03/03/1978   Smokeless tobacco: Never   Tobacco comments:    4 ciggs per day  Vaping Use   Vaping Use: Every day  Substance Use Topics   Alcohol use: No    Alcohol/week: 0.0 standard drinks   Drug use: No    Comment: Prior history of cocaine    Home Medications Prior to Admission medications   Medication Sig Start Date End Date Taking? Authorizing Provider  ALPRAZolam (XANAX) 0.25 MG tablet TAKE 1 TABLET BY MOUTH AT BEDTIME AS NEEDED FOR ANXIETY Patient taking differently: Take 0.25 mg by mouth at bedtime as needed for anxiety. 06/29/21  Yes Milford, Anderson MaltaJessica M, FNP  amiodarone (PACERONE) 200 MG tablet Take 1 tablet (200 mg total) by mouth 2 (two) times daily. Take 400 mg (2 tablets) Monday thru Friday, On Saturday and Sunday take 200 mg (1 tablet) Patient taking differently: Take 400 mg by mouth 2 (two) times daily. 06/26/21  Yes Mikhail, [place] VerdeMaryann, DO  carvedilol (  COREG) 6.25 MG tablet Take 12.5 mg by mouth 2 (two) times daily with a meal.   Yes [provider]  clopidogrel (PLAVIX) 75 MG tablet Take 1 tablet (75 mg total) by mouth daily with breakfast. 01/10/21  Yes Robbie Lis M, PA-C  DULoxetine (CYMBALTA) 60 MG capsule Take 60 mg by mouth daily.   Yes [provider]  empagliflozin (JARDIANCE) 10 MG TABS tablet Take 1 tablet (10 mg total) by mouth daily before breakfast. 01/03/21  Yes [name], MD  ezetimibe (ZETIA) 10 MG tablet TAKE 1 TABLET BY MOUTH EVERY DAY Patient taking differently: Take 10 mg by mouth daily. 05/18/21  Yes [name], MD  hydrOXYzine (ATARAX/VISTARIL) 10 MG tablet Take 1 tablet (10 mg total) by mouth 3 (three) times daily as needed. Patient taking differently: Take 10 mg by mouth 3 (three) times daily as needed for anxiety. 07/10/21  Yes [name] F, FNP  isosorbide mononitrate (IMDUR) 30 MG 24 hr tablet Take 0.5 tablets (15 mg total) by mouth daily.  03/16/21  Yes Sheilah Pigeon, PA-C  methocarbamol (ROBAXIN) 750 MG tablet Take 1 tablet (750 mg total) by mouth every 8 (eight) hours as needed for muscle spasms. 06/26/21  Yes Mikhail, Nita Sells, DO  mexiletine (MEXITIL) 200 MG capsule Take 1 capsule (200 mg total) by mouth every 8 (eight) hours. 05/07/21  Yes [name], MD  Multiple Vitamins-Minerals (MULTIVITAMINS THER. W/MINERALS) TABS Take 1 tablet by mouth daily.     Yes [provider]  nitroGLYCERIN (NITROSTAT) 0.4 MG SL tablet PLACE 1 TABLET UNDER THE TONGUE EVERY 5 MINUTES FOR 3 DOSES AS NEEDED CHEST PAIN Patient taking differently: Place 0.4 mg under the tongue every 5 (five) minutes as needed for chest pain. 01/25/21  Yes [name], MD  oxyCODONE-acetaminophen (PERCOCET) 10-325 MG tablet Take 1 tablet by mouth every 4 (four) hours as needed for pain.   Yes [provider]  pantoprazole (PROTONIX) 40 MG tablet Take 1 tablet (40 mg total) by mouth daily. 03/28/21 03/28/22 Yes [name], MD  potassium chloride SA (KLOR-CON) 20 MEQ tablet Take 2 tablets (40 mEq total) by mouth daily. 02/12/21  Yes [name], MD  sacubitril-valsartan (ENTRESTO) 24-26 MG Take 1 tablet by mouth 2 (two) times daily. 06/05/21  Yes [name], MD  spironolactone (ALDACTONE) 25 MG tablet Take 1 tablet (25 mg total) by mouth daily. 11/28/20  Yes [name], MD  torsemide (DEMADEX) 20 MG tablet Take 60 mg by mouth daily. 05/28/21  Yes [provider]  traZODone (DESYREL) 100 MG tablet Take 100 mg by mouth at bedtime as needed for sleep.   Yes [provider]  warfarin (COUMADIN) 10 MG tablet TAKE 1/2 TABLET BY MOUTH EVERY DAY EXCEPT 1 TABLET ON SUNDAYS AND THURSDAYS Patient taking differently: Take 5 mg by mouth at bedtime. 02/12/21  Yes [name], MD  EPINEPHrine 0.3 mg/0.3 mL IJ SOAJ injection Inject 0.3 mg into the muscle as needed.    [provider]  sennosides-docusate  sodium (SENOKOT-S) 8.6-50 MG tablet Take 1 tablet by mouth daily.    [provider]    Allergies    Beef-derived products, Metoclopramide hcl, Penicillins, Pork-derived products, Metoprolol, Reglan [metoclopramide], and Statins  Review of Systems   Review of Systems  Constitutional:  Negative for chills and fever.  HENT:  Negative for congestion.   Eyes:  Negative for visual disturbance.  Respiratory:  Positive for chest  tightness. Negative for shortness of breath.   Cardiovascular:  Negative for chest pain.  Gastrointestinal:  Negative for abdominal pain, diarrhea and vomiting.  Genitourinary:  Negative for dysuria.  Skin:  Negative for rash.  Neurological:  Negative for headaches.   Physical Exam Updated Vital Signs BP 113/74   Pulse (!) 56   Temp 97.9 F (36.6 C) (Oral)   Resp 17   SpO2 95%   Physical Exam Vitals and nursing note reviewed.  Constitutional:      General: She is not in acute distress.    Appearance: Normal appearance.  HENT:     Head: Normocephalic.     Mouth/Throat:     Mouth: Mucous membranes are moist.  Cardiovascular:     Rate and Rhythm: Normal rate.  Pulmonary:     Effort: Pulmonary effort is normal. No respiratory distress.  Abdominal:     Palpations: Abdomen is soft.     Tenderness: There is no abdominal tenderness.  Musculoskeletal:        General: No swelling.  Skin:    General: Skin is warm.  Neurological:     Mental Status: She is alert and oriented to person, place, and time. Mental status is at baseline.  Psychiatric:        Mood and Affect: Mood normal.    ED Results / Procedures / Treatments   Labs (all labs ordered are listed, but only abnormal results are displayed) Labs Reviewed  BASIC METABOLIC PANEL - Abnormal; Notable for the following components:      Result Value   Chloride 97 (*)    Calcium 8.7 (*)    All other components within normal limits  I-STAT BETA HCG BLOOD, ED ([facility], [facility], [facility] ONLY) - Abnormal;  Notable for the following components:   I-stat hCG, quantitative 9.4 (*)    All other components within normal limits  I-STAT CHEM 8, ED - Abnormal; Notable for the following components:   Chloride 97 (*)    Creatinine, Ser 1.10 (*)    Calcium, Ion 1.10 (*)    Hemoglobin 16.0 (*)    HCT 47.0 (*)    All other components within normal limits  CBC  PROTIME-INR  MAGNESIUM  TROPONIN I (HIGH SENSITIVITY)  TROPONIN I (HIGH SENSITIVITY)    EKG None  Radiology DG Chest 2 View  Result Date: 07/12/2021 CLINICAL DATA:  Chest pain, chest tightness and pain since last evening. EXAM: CHEST - 2 VIEW COMPARISON:  CT of the chest from April 5th select April August of 2021, chest x-ray from January 04, 2021. FINDINGS: LEFT-sided dual lead pacer defibrillator with leads projecting over the cardiac silhouette is on previous studies. Signs of LEFT lateral spinal fusion in the upper lumbar spine with changes of corpectomy grossly similar to previous imaging. Trachea is midline. Heart size remains enlarged. Hilar structures are stable. No signs of edema, effusion or consolidation. Linear scarring in the LEFT mid chest is similar to the prior study. On limited assessment no acute skeletal process. IMPRESSION: 1. Stable cardiomegaly. No acute cardiopulmonary disease. Electronically Signed   By: Donzetta Kohut M.D.   On: 07/12/2021 11:47    Procedures Procedures   Medications Ordered in ED Medications - No data to display  ED Course  I have reviewed the triage vital signs and the nursing notes.  Pertinent labs & imaging results that were available during my care of the patient were reviewed by me and considered in my medical decision making (see chart  for details).    MDM Rules/Calculators/A&P                          60 year old female presents the emergency department with concern for V. tach.  Patient states that she was called by [employer] after multiple events.  She admits that she had a  couple episodes of chest tightness but no shocks.  Vitals are stable here, EKG is unchanged for her.  Blood work is baseline with 2 negative troponins.  Device interrogation shows that there were episodes of V. tach however she was appropriately paced out with no shock.  Spoke with Dr. Rennis Golden cardiology who contacted electrophysiology.  Currently the patient is optimized in terms of her device and medication, there is nothing more to do from an emergent standpoint and they will follow-up with her in an outpatient setting.  Patient agrees with this discharge plan.  Patient will be discharged and treated as an outpatient.  Discharge plan and strict return to ED precautions discussed, patient verbalizes understanding and agreement.  Final Clinical Impression(s) / ED Diagnoses Final diagnoses:  None    Rx / DC Orders ED Discharge Orders     None        Rozelle Logan, DO 07/12/21 1631

## 2021-07-12 NOTE — Telephone Encounter (Signed)
Patient returned phone call. Reports yesterday she felt like something wasn't right. Reports of chest tightness and couldn't take a deep breath in. Reports she feels the same way today, describes chest discomfort as "tightness" and not able to take a deep breath in. Reports she also has experienced palpitations.   Advised patient to go to the ED for evaluation considering symptoms and recent ATP X2 events. No MD is in the office today. Patient advised not to drive.   Discussed with patient shock plan and [place] DMV driving restrictions x6 months.   Patient verbalized understanding and agreeable to plan.

## 2021-07-12 NOTE — ED Notes (Signed)
Pt in Xray at this time.

## 2021-07-12 NOTE — Telephone Encounter (Signed)
Lat[employer] alert received for 2 VT arrhythmias detected 07-11-21 @ 17:09 & 17:10, both were successfully converted with one burst ATP therapy.   Attempted to contact patient to discuss symptoms, medication compliance, driving restrictions and shock plan. No answer, LMTCB.

## 2021-07-12 NOTE — ED Notes (Signed)
[employer] to fax interrogation results.

## 2021-07-12 NOTE — ED Triage Notes (Signed)
Patient here after last night her AICD company called her to tell her she went into vtach last night around 1900. Device is [employer]. Patient alert, oriented, and in no apparent distress at this time.

## 2021-07-14 NOTE — Telephone Encounter (Signed)
Agree with plan as noted above. GT

## 2021-07-15 ENCOUNTER — Other Ambulatory Visit (HOSPITAL_COMMUNITY): Payer: Self-pay | Admitting: Cardiology

## 2021-07-23 ENCOUNTER — Encounter: Payer: Self-pay | Admitting: Student

## 2021-07-23 ENCOUNTER — Other Ambulatory Visit: Payer: Self-pay

## 2021-07-23 ENCOUNTER — Ambulatory Visit (INDEPENDENT_AMBULATORY_CARE_PROVIDER_SITE_OTHER): Payer: Medicare PPO | Admitting: Student

## 2021-07-23 VITALS — BP 110/68 | HR 56 | Ht 65.0 in | Wt 194.4 lb

## 2021-07-23 DIAGNOSIS — I480 Paroxysmal atrial fibrillation: Secondary | ICD-10-CM | POA: Diagnosis not present

## 2021-07-23 DIAGNOSIS — I5022 Chronic systolic (congestive) heart failure: Secondary | ICD-10-CM | POA: Diagnosis not present

## 2021-07-23 DIAGNOSIS — I472 Ventricular tachycardia, unspecified: Secondary | ICD-10-CM

## 2021-07-23 DIAGNOSIS — I2510 Atherosclerotic heart disease of native coronary artery without angina pectoris: Secondary | ICD-10-CM | POA: Diagnosis not present

## 2021-07-23 LAB — CUP PACEART INCLINIC DEVICE CHECK
Date Time Interrogation Session: 20220725125921
HighPow Impedance: 50 Ohm
HighPow Impedance: 55 Ohm
Implantable Lead Implant Date: 20081119
Implantable Lead Implant Date: 20081119
Implantable Lead Location: 753859
Implantable Lead Location: 753860
Implantable Lead Model: 185
Implantable Lead Model: 4136
Implantable Lead Serial Number: 211124
Implantable Lead Serial Number: 28383796
Implantable Pulse Generator Implant Date: 20170303
Lead Channel Impedance Value: 491 Ohm
Lead Channel Impedance Value: 570 Ohm
Lead Channel Pacing Threshold Amplitude: 0.7 V
Lead Channel Pacing Threshold Amplitude: 1.2 V
Lead Channel Pacing Threshold Pulse Width: 0.5 ms
Lead Channel Pacing Threshold Pulse Width: 0.5 ms
Lead Channel Sensing Intrinsic Amplitude: 1.6 mV
Lead Channel Sensing Intrinsic Amplitude: 25 mV
Lead Channel Setting Pacing Amplitude: 2 V
Lead Channel Setting Pacing Amplitude: 2.4 V
Lead Channel Setting Pacing Pulse Width: 0.5 ms
Lead Channel Setting Sensing Sensitivity: 0.6 mV
Pulse Gen Serial Number: 204398

## 2021-07-23 MED ORDER — ISOSORBIDE MONONITRATE ER 30 MG PO TB24: 30.0000 mg | ORAL_TABLET | Freq: Every day | ORAL | 3 refills | Status: DC

## 2021-07-23 NOTE — Patient Instructions (Signed)
Medication Instructions:  Your physician has recommended you make the following change in your medication:  INCREASE: Isosorbide to 30mg  daily  *If you need a refill on your cardiac medications before your next appointment, please call your pharmacy*   Lab Work: TODAY: CMET, TSH, CBC  If you have labs (blood work) drawn today and your tests are completely normal, you will receive your results only by: MyChart Message (if you have MyChart) OR A paper copy in the mail If you have any lab test that is abnormal or we need to change your treatment, we will call you to review the results.    Follow-Up: At [facility], you and your health needs are our priority.  As part of our continuing mission to provide you with exceptional heart care, we have created designated Provider Care Teams.  These Care Teams include your primary Cardiologist (physician) and Advanced Practice Providers (APPs -  Physician Assistants and Nurse Practitioners) who all work together to provide you with the care you need, when you need it.   Your next appointment:   As scheduled

## 2021-07-23 NOTE — Progress Notes (Signed)
Electrophysiology Office Note Date: 07/23/2021  ID:  [name] [name], DOB 02/20/1961, MRN [id number]  PCP: [name], FNP Primary Cardiologist: Nona Dell, MD Electrophysiologist: Lewayne Bunting, MD   CC: Routine ICD follow-up  [name] [name] is a 60 y.o. female seen today for Lewayne Bunting, MD for post hospital follow up.  Since discharge from hospital the patient reports doing very well. No further issues. Chest pain overall well controlled.  she denies palpitations, dyspnea, PND, orthopnea, nausea, vomiting, dizziness, syncope, edema, weight gain, or early satiety. He has not had ICD shocks.   Device History: Field seismologist ICD implanted 2017 for chronic systolic CHF History of appropriate therapy: Yes History of AAD therapy: Yes; currently on amiodarone   Past Medical History:  Diagnosis Date   Aneurysm of internal carotid artery    Anti-phospholipid syndrome (HCC)    Anticardiolipin antibody positive    Chronic Coumadin   Brain aneurysm    followed by Dr. Corliss Skains   Chronic combined systolic (congestive) and diastolic (congestive) heart failure (HCC)    Cocaine abuse in remission [facility])    Coronary atherosclerosis of native coronary artery    Previous invasive cardiac testing was done at a facility in [place], [place].  Occluded diagonal, Diffuse LAD disease, Occluded Cx, and non obstructive RCA.    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 Dutch Quint L1 cage, 5 disc fusion   CARDIAC DEFIBRILLATOR PLACEMENT  2008   [employer] ICD   CARDIOVERSION N/A 12/01/2020   Procedure: CARDIOVERSION;  Surgeon: [name], MD;  Location: [facility] ENDOSCOPY;  Service: Cardiovascular;  Laterality: N/A;   CORONARY STENT INTERVENTION N/A 01/08/2021   Procedure: CORONARY STENT INTERVENTION;  Surgeon: Swaziland, Peter M, MD;  Location: [facility] INVASIVE CV LAB;  Service: Cardiovascular;  Laterality: N/A;   EP IMPLANTABLE DEVICE N/A 03/01/2016   Procedure: ICD Generator Changeout;  Surgeon: [name], MD;  Location: [facility] INVASIVE CV LAB;  Service: Cardiovascular;  Laterality: N/A;   ICD/BIV ICD FUNCTION(DFT) TEST N/A 03/14/2021   Procedure: ICD/BIV ICD FUNCTION (DFT) TEST;  Surgeon: [name], MD;  Location: [facility] INVASIVE CV LAB;  Service: Cardiovascular;  Laterality: N/A;   L1 corpectomy   11/29/2009   Laryngeal polyp excision     RIGHT/LEFT HEART CATH AND CORONARY ANGIOGRAPHY N/A 12/18/2020   Procedure: RIGHT/LEFT HEART CATH AND CORONARY ANGIOGRAPHY;  Surgeon: Dolores Patty, MD;  Location: [facility] INVASIVE CV LAB;  Service: Cardiovascular;  Laterality: N/A;   RIGHT/LEFT HEART CATH AND CORONARY ANGIOGRAPHY N/A 03/05/2021   Procedure: RIGHT/LEFT HEART CATH AND CORONARY ANGIOGRAPHY;  Surgeon: [name], MD;  Location: [facility] INVASIVE CV LAB;  Service: Cardiovascular;  Laterality: N/A;   TEE WITHOUT CARDIOVERSION N/A 12/01/2020  Procedure: TRANSESOPHAGEAL ECHOCARDIOGRAM (TEE);  Surgeon: [name], MD;  Location: [facility] ENDOSCOPY;  Service: Cardiovascular;  Laterality: N/AFloyce Stakes ABLATION N/A 03/08/2021   Procedure: Floyce Stakes ABLATION;  Surgeon: Lanier Prude, MD;  Location: [facility] INVASIVE CV LAB;  Service: Cardiovascular;  Laterality: N/A;    Current Outpatient Medications  Medication Sig  Dispense Refill   ALPRAZolam (XANAX) 0.25 MG tablet TAKE 1 TABLET BY MOUTH AT BEDTIME AS NEEDED FOR ANXIETY 30 tablet 0   amiodarone (PACERONE) 200 MG tablet Take 1 tablet (200 mg total) by mouth 2 (two) times daily. Take 400 mg (2 tablets) Monday thru Friday, On Saturday and Sunday take 200 mg (1 tablet) 60 tablet 1   carvedilol (COREG) 6.25 MG tablet Take 12.5 mg by mouth 2 (two) times daily with a meal.     clopidogrel (PLAVIX) 75 MG tablet Take 1 tablet (75 mg total) by mouth daily with breakfast. 30 tablet 11   DULoxetine (CYMBALTA) 60 MG capsule Take 60 mg by mouth daily.     empagliflozin (JARDIANCE) 10 MG TABS tablet Take 1 tablet (10 mg total) by mouth daily before breakfast. 30 tablet 3   EPINEPHrine 0.3 mg/0.3 mL IJ SOAJ injection Inject 0.3 mg into the muscle as needed.     ezetimibe (ZETIA) 10 MG tablet TAKE 1 TABLET BY MOUTH EVERY DAY 30 tablet 5   hydrOXYzine (ATARAX/VISTARIL) 10 MG tablet Take 1 tablet (10 mg total) by mouth 3 (three) times daily as needed. 30 tablet 2   methocarbamol (ROBAXIN) 750 MG tablet Take 1 tablet (750 mg total) by mouth every 8 (eight) hours as needed for muscle spasms. 45 tablet 0   mexiletine (MEXITIL) 200 MG capsule Take 1 capsule (200 mg total) by mouth every 8 (eight) hours. 90 capsule 3   Multiple Vitamins-Minerals (MULTIVITAMINS THER. W/MINERALS) TABS Take 1 tablet by mouth daily.       nitroGLYCERIN (NITROSTAT) 0.4 MG SL tablet PLACE 1 TABLET UNDER THE TONGUE EVERY 5 MINUTES FOR 3 DOSES AS NEEDED CHEST PAIN 25 tablet 3   oxyCODONE-acetaminophen (PERCOCET) 10-325 MG tablet Take 1 tablet by mouth every 4 (four) hours as needed for pain.     pantoprazole (PROTONIX) 40 MG tablet Take 1 tablet (40 mg total) by mouth daily. 30 tablet 11   potassium chloride SA (KLOR-CON) 20 MEQ tablet Take 2 tablets (40 mEq total) by mouth daily. 180 tablet 1   sacubitril-valsartan (ENTRESTO) 24-26 MG Take 1 tablet by mouth 2 (two) times daily. 180 tablet 3    sennosides-docusate sodium (SENOKOT-S) 8.6-50 MG tablet Take 1 tablet by mouth daily.     spironolactone (ALDACTONE) 25 MG tablet TAKE 1 TABLET BY MOUTH EVERY DAY 30 tablet 11   torsemide (DEMADEX) 20 MG tablet Take 60 mg by mouth daily.     traZODone (DESYREL) 100 MG tablet Take 100 mg by mouth at bedtime as needed for sleep.     warfarin (COUMADIN) 10 MG tablet TAKE 1/2 TABLET BY MOUTH EVERY DAY EXCEPT 1 TABLET ON SUNDAYS AND THURSDAYS (Patient taking differently: Take 5 mg by mouth at bedtime.) 30 tablet 3   isosorbide mononitrate (IMDUR) 30 MG 24 hr tablet Take 1 tablet (30 mg total) by mouth daily. 90 tablet 3   No current facility-administered medications for this visit.    Allergies:   Beef-derived products, Metoclopramide hcl, Penicillins, Pork-derived products, Metoprolol, Reglan [metoclopramide], and Statins   Social History: Social History   Socioeconomic History  Marital status: Single    Spouse name: Not on file   Number of children: Not on file   Years of education: Not on file   Highest education level: Not on file  Occupational History   Occupation: Disabled    Comment: ESL  Tobacco Use   Smoking status: Former    Packs/day: 0.25    Years: 43.00    Pack years: 10.75    Types: Cigarettes, E-cigarettes    Start date: 03/03/1978    Quit date: 07/21/2021   Smokeless tobacco: Never   Tobacco comments:    4 ciggs per day  Vaping Use   Vaping Use: Every day  Substance and Sexual Activity   Alcohol use: No    Alcohol/week: 0.0 standard drinks   Drug use: No    Comment: Prior history of cocaine   Sexual activity: Not Currently    Birth control/protection: None  Other Topics Concern   Not on file  Social History Narrative   Not on file   Social Determinants of Health   Financial Resource Strain: Low Risk    Difficulty of Paying Living Expenses: Not very hard  Food Insecurity: No Food Insecurity   Worried About Programme researcher, broadcasting/film/video in the Last Year: Never true    Ran Out of Food in the Last Year: Never true  Transportation Needs: No Transportation Needs   Lack of Transportation (Medical): No   Lack of Transportation (Non-Medical): No  Physical Activity: Not on file  Stress: Not on file  Social Connections: Not on file  Intimate Partner Violence: Not on file    Family History: Family History  Problem Relation Age of Onset   Uterine cancer Mother    Heart disease Father 20   Hyperlipidemia Father    Hypertension Father    Ankylosing spondylitis Sister    Heart disease Sister    Hypertension Sister    Stomach cancer Brother 49   Heart attack Brother     Review of Systems: All other systems reviewed and are otherwise negative except as noted above.   Physical Exam: Vitals:   07/23/21 1150  BP: 110/68  Pulse: (!) 56  SpO2: 96%  Weight: 194 lb 6.4 oz (88.2 kg)  Height: 5\' 5"  (1.651 m)     GEN- The patient is well appearing, alert and oriented x 3 today.   HEENT: normocephalic, atraumatic; sclera clear, conjunctiva pink; hearing intact; oropharynx clear; neck supple, no JVP Lymph- no cervical lymphadenopathy Lungs- Clear to ausculation bilaterally, normal work of breathing.  No wheezes, rales, rhonchi Heart- Regular rate and rhythm, no murmurs, rubs or gallops, PMI not laterally displaced GI- soft, non-tender, non-distended, bowel sounds present, no hepatosplenomegaly Extremities- no clubbing or cyanosis. No edema; DP/PT/radial pulses 2+ bilaterally MS- no significant deformity or atrophy Skin- warm and dry, no rash or lesion; ICD pocket well healed Psych- euthymic mood, full affect Neuro- strength and sensation are intact  ICD interrogation- reviewed in detail today,  See PACEART report  EKG:  EKG is not ordered today. The ekg ordered 07/12/21 shows sinus bradycardia at 58 bpm  Recent Labs: 03/01/2021: B Natriuretic Peptide 194.0 06/23/2021: ALT 27; TSH 3.401 07/12/2021: BUN 19; Creatinine, Ser 1.10; Hemoglobin 16.0;  Magnesium 2.0; Platelets 165; Potassium 3.8; Sodium 138   Wt Readings from Last 3 Encounters:  07/23/21 194 lb 6.4 oz (88.2 kg)  07/10/21 196 lb (88.9 kg)  06/26/21 195 lb 3.2 oz (88.5 kg)     Other studies Reviewed:  Additional studies/ records that were reviewed today include: Previous EP office notes   Assessment and Plan:  1.  Chronic systolic dysfunction s/p [employer] dual chamber ICD  euvolemic today Stable on an appropriate medical regimen Normal ICD function See Pace Art report No changes today  2. VT Continues to have intermittent VT. ATP 07/12/2021  Attempt at VT ablation 03/08/2021 with scar homogenization. Pt had multiple VTs that were induced and hemodynamically unstable. They were not able to be ablated.  Increase imdur to 30 mg daily.  Continue amiodarone and mexitil. Amio labs today. She was previously on ranolazine and did not appear to derive benefit from it.  She understands that anytime she has device therapy her 6 months of driving restrictions restarts.   3. PVCs Stable. Not ablation candidate with multiple morphologies and with hemodynamically unstable VT during previous ablation attempt.   4. CAD with stable angina HS trop flat in ED several weeks ago.  Increase imdur to 30 mg daily.   Current medicines are reviewed at length with the patient today.   The patient does not have concerns regarding her medicines.  The following changes were made today:  imdur increased  Labs/ tests ordered today include:  Orders Placed This Encounter  Procedures   Comprehensive metabolic panel   TSH   CBC    Disposition:   Follow up with Dr. Ladona Ridgel  3 months   Signed, Graciella Freer, PA-C  07/23/2021 1:11 PM  [facility] [address] [place] [address] [phone] (office) [phone] (fax)

## 2021-07-24 ENCOUNTER — Encounter (HOSPITAL_COMMUNITY): Payer: Self-pay

## 2021-07-24 LAB — CBC
Hematocrit: 43.8 % (ref 34.0–46.6)
Hemoglobin: 14.2 g/dL (ref 11.1–15.9)
MCH: 28.8 pg (ref 26.6–33.0)
MCHC: 32.4 g/dL (ref 31.5–35.7)
MCV: 89 fL (ref 79–97)
Platelets: 151 10*3/uL (ref 150–450)
RBC: 4.93 x10E6/uL (ref 3.77–5.28)
RDW: 12.6 % (ref 11.7–15.4)
WBC: 6.6 10*3/uL (ref 3.4–10.8)

## 2021-07-24 LAB — COMPREHENSIVE METABOLIC PANEL
ALT: 86 IU/L — ABNORMAL HIGH (ref 0–32)
AST: 90 IU/L — ABNORMAL HIGH (ref 0–40)
Albumin/Globulin Ratio: 1.5 (ref 1.2–2.2)
Albumin: 4.1 g/dL (ref 3.8–4.9)
Alkaline Phosphatase: 128 IU/L — ABNORMAL HIGH (ref 44–121)
BUN/Creatinine Ratio: 13 (ref 9–23)
BUN: 14 mg/dL (ref 6–24)
Bilirubin Total: 0.3 mg/dL (ref 0.0–1.2)
CO2: 27 mmol/L (ref 20–29)
Calcium: 8.7 mg/dL (ref 8.7–10.2)
Chloride: 95 mmol/L — ABNORMAL LOW (ref 96–106)
Creatinine, Ser: 1.1 mg/dL — ABNORMAL HIGH (ref 0.57–1.00)
Globulin, Total: 2.7 g/dL (ref 1.5–4.5)
Glucose: 78 mg/dL (ref 65–99)
Potassium: 4.2 mmol/L (ref 3.5–5.2)
Sodium: 139 mmol/L (ref 134–144)
Total Protein: 6.8 g/dL (ref 6.0–8.5)
eGFR: 58 mL/min/{1.73_m2} — ABNORMAL LOW (ref >59)

## 2021-07-24 LAB — TSH: TSH: 2.86 u[IU]/mL (ref 0.450–4.500)

## 2021-07-25 DIAGNOSIS — G8912 Acute post-thoracotomy pain: Secondary | ICD-10-CM | POA: Diagnosis not present

## 2021-07-25 DIAGNOSIS — M545 Low back pain, unspecified: Secondary | ICD-10-CM | POA: Diagnosis not present

## 2021-07-25 DIAGNOSIS — M25569 Pain in unspecified knee: Secondary | ICD-10-CM | POA: Diagnosis not present

## 2021-07-25 DIAGNOSIS — G8929 Other chronic pain: Secondary | ICD-10-CM | POA: Diagnosis not present

## 2021-07-25 DIAGNOSIS — G4700 Insomnia, unspecified: Secondary | ICD-10-CM | POA: Diagnosis not present

## 2021-07-25 DIAGNOSIS — Z79891 Long term (current) use of opiate analgesic: Secondary | ICD-10-CM | POA: Diagnosis not present

## 2021-07-31 ENCOUNTER — Telehealth: Payer: Self-pay

## 2021-07-31 NOTE — Chronic Care Management (AMB) (Signed)
  Chronic Care Management   Note  07/31/2021 Name: JAMESA TEDRICK MRN: [id number] DOB: 04/12/1961  AMBERMARIE HONEYMAN is a 60 y.o. year old female who is a primary care patient of Loman Brooklyn, FNP. I reached out to Marcia Brash by phone today in response to a referral sent by Ms. Carita Pian Whicker's PCP, Loman Brooklyn, FNP      Ms. Hansmann was given information about Chronic Care Management services today including:  CCM service includes personalized support from designated clinical staff supervised by her physician, including individualized plan of care and coordination with other care providers 24/7 contact phone numbers for assistance for urgent and routine care needs. Service will only be billed when office clinical staff spend 20 minutes or more in a month to coordinate care. Only one practitioner may furnish and bill the service in a calendar month. The patient may stop CCM services at any time (effective at the end of the month) by phone call to the office staff. The patient will be responsible for cost sharing (co-pay) of up to 20% of the service fee (after annual deductible is met).  Patient did not agree to enrollment in care management services and does not wish to consider at this time.  Follow up plan: Patient declines engagement by the care management team. Appropriate care team members and provider have been notified via electronic communication.   Noreene Larsson, [place], [place], [place] [address] Direct Dial: [phone] [email or web address]

## 2021-08-01 ENCOUNTER — Ambulatory Visit (INDEPENDENT_AMBULATORY_CARE_PROVIDER_SITE_OTHER): Payer: Medicare PPO | Admitting: *Deleted

## 2021-08-01 ENCOUNTER — Other Ambulatory Visit: Payer: Self-pay | Admitting: Family Medicine

## 2021-08-01 ENCOUNTER — Encounter (HOSPITAL_COMMUNITY): Payer: Self-pay

## 2021-08-01 DIAGNOSIS — D6859 Other primary thrombophilia: Secondary | ICD-10-CM

## 2021-08-01 DIAGNOSIS — Z5181 Encounter for therapeutic drug level monitoring: Secondary | ICD-10-CM

## 2021-08-01 DIAGNOSIS — F4310 Post-traumatic stress disorder, unspecified: Secondary | ICD-10-CM

## 2021-08-01 DIAGNOSIS — I63549 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: Secondary | ICD-10-CM | POA: Diagnosis not present

## 2021-08-01 DIAGNOSIS — D6861 Antiphospholipid syndrome: Secondary | ICD-10-CM

## 2021-08-01 LAB — POCT INR: INR: 3.4 — AB (ref 2.0–3.0)

## 2021-08-01 NOTE — Patient Instructions (Signed)
On amiodarone 200mg  twice a day Hold warfarin tonight then resume 1/2 tablet daily  Recheck in 3 wk

## 2021-08-02 NOTE — Progress Notes (Signed)
Electrophysiology Office Note Date: 08/03/2021  ID:  [name] [name], DOB Nov 08, 1961, MRN [id number]  PCP: [name], FNP Primary Cardiologist: Nona Dell, MD Electrophysiologist: Lewayne Bunting, MD   CC: Acute visit for SOB  [name] [name] is a 60 y.o. female seen today for Lewayne Bunting, MD for acute visit due to SOB and malaise .    Since seen in clinic 7/26, reports doing overall OK. She has been very nervous about the prospect of needing a new monitor at home. Worried that she will have an event and we won't know about it. She continues to take NTG 2-3 times a week. Had more SOB earlier this week over a 4 day period that has now resolved.  She used 8-10 extra torsemide a month, and usually runs out before the end of the month and has to get a partial fill.  No further syncope or falls. Today she feels about at her baseline.  Device History: Field seismologist ICD implanted 2017 for chronic systolic CHF History of appropriate therapy: Yes History of AAD therapy: Yes; currently on amiodarone   Past Medical History:  Diagnosis Date   Aneurysm of internal carotid artery    Anti-phospholipid syndrome (HCC)    Anticardiolipin antibody positive    Chronic Coumadin   Brain aneurysm    followed by Dr. Corliss Skains   Chronic combined systolic (congestive) and diastolic (congestive) heart failure (HCC)    Cocaine abuse in remission [facility])    Coronary atherosclerosis of native coronary artery    Previous invasive cardiac testing was done at a facility in [place], [place].  Occluded diagonal, Diffuse LAD disease, Occluded Cx, and non obstructive RCA.    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 Dutch Quint L1 cage, 5 disc fusion   CARDIAC DEFIBRILLATOR PLACEMENT  2008   [employer] ICD   CARDIOVERSION N/A 12/01/2020   Procedure: CARDIOVERSION;  Surgeon: [name], MD;  Location: [facility] ENDOSCOPY;  Service: Cardiovascular;  Laterality: N/A;   CORONARY STENT INTERVENTION N/A 01/08/2021   Procedure: CORONARY STENT INTERVENTION;  Surgeon: Swaziland, Peter M, MD;  Location: [facility] INVASIVE CV LAB;  Service: Cardiovascular;  Laterality: N/A;   EP IMPLANTABLE DEVICE N/A 03/01/2016   Procedure: ICD Generator Changeout;  Surgeon: [name], MD;  Location: [facility] INVASIVE CV LAB;  Service: Cardiovascular;  Laterality: N/A;   ICD/BIV ICD FUNCTION(DFT) TEST N/A 03/14/2021   Procedure: ICD/BIV ICD FUNCTION (DFT) TEST;  Surgeon: [name], MD;  Location: [facility] INVASIVE CV LAB;  Service: Cardiovascular;  Laterality: N/A;   L1 corpectomy   11/29/2009   Laryngeal polyp excision     RIGHT/LEFT HEART CATH AND CORONARY ANGIOGRAPHY N/A 12/18/2020   Procedure: RIGHT/LEFT HEART CATH AND CORONARY ANGIOGRAPHY;  Surgeon: Dolores Patty, MD;  Location: [facility] INVASIVE CV LAB;  Service: Cardiovascular;  Laterality: N/A;   RIGHT/LEFT HEART CATH AND CORONARY ANGIOGRAPHY N/A 03/05/2021   Procedure: RIGHT/LEFT HEART CATH AND CORONARY ANGIOGRAPHY;  Surgeon: [name], MD;  Location: [facility] INVASIVE CV LAB;  Service: Cardiovascular;  Laterality: N/A;   TEE WITHOUT CARDIOVERSION N/A 12/01/2020   Procedure: TRANSESOPHAGEAL ECHOCARDIOGRAM (TEE);  Surgeon: [name], MD;  Location: [facility] ENDOSCOPY;  Service: Cardiovascular;  Laterality: N/AFloyce Stakes ABLATION N/A 03/08/2021   Procedure: Floyce Stakes ABLATION;  Surgeon: Lanier Prude, MD;  Location: [facility] INVASIVE CV LAB;  Service: Cardiovascular;  Laterality: N/A;    Current Outpatient Medications  Medication Sig Dispense Refill   ALPRAZolam (XANAX) 0.25 MG tablet TAKE 1 TABLET BY MOUTH AT BEDTIME AS NEEDED FOR ANXIETY 30 tablet 0   amiodarone (PACERONE) 200 MG tablet Take 1 tablet (200 mg total) by mouth 2 (two) times daily. Take 400 mg (2 tablets) Monday thru Friday, On Saturday and Sunday take 200 mg (1 tablet) 60 tablet 1   carvedilol (COREG) 6.25 MG tablet Take 12.5 mg by mouth 2 (two) times daily with a meal.     clopidogrel (PLAVIX) 75 MG tablet Take 1 tablet (75 mg total) by mouth daily with breakfast. 30 tablet 11   DULoxetine (CYMBALTA) 60 MG capsule Take 60 mg by mouth daily.     empagliflozin (JARDIANCE) 10 MG TABS tablet Take 1 tablet (10 mg total) by mouth daily before breakfast. 30 tablet 3   EPINEPHrine 0.3 mg/0.3 mL IJ SOAJ injection Inject 0.3 mg into the muscle as needed.     ezetimibe (ZETIA) 10 MG tablet TAKE 1 TABLET BY MOUTH EVERY DAY 30 tablet 5   hydrOXYzine (ATARAX/VISTARIL) 10 MG tablet TAKE 1 TABLET BY MOUTH THREE TIMES DAILY AS NEEDED 90 tablet 2   isosorbide mononitrate (IMDUR) 30 MG 24 hr tablet Take 1 tablet (30 mg total) by mouth daily. 90 tablet 3   methocarbamol (ROBAXIN) 750 MG tablet Take 1 tablet (750 mg total) by mouth every 8 (eight) hours as needed for muscle spasms. 45 tablet 0   mexiletine (MEXITIL) 200 MG capsule Take 1 capsule (200 mg total) by mouth every 8 (eight) hours. 90 capsule 3   Multiple Vitamins-Minerals (MULTIVITAMINS THER. W/MINERALS) TABS Take 1 tablet by mouth daily.       nitroGLYCERIN (NITROSTAT) 0.4 MG SL tablet PLACE 1 TABLET UNDER THE TONGUE EVERY 5 MINUTES FOR 3 DOSES AS NEEDED CHEST PAIN 25 tablet 3   oxyCODONE-acetaminophen (PERCOCET) 10-325 MG tablet Take 1 tablet by mouth every 4 (four) hours as needed for pain.      pantoprazole (PROTONIX) 40 MG tablet Take 1 tablet (40 mg total) by mouth daily. 30 tablet 11   potassium chloride SA (KLOR-CON) 20 MEQ tablet Take 2 tablets (40 mEq total) by mouth daily. 180 tablet 1   sacubitril-valsartan (ENTRESTO) 24-26 MG Take 1 tablet by mouth 2 (two) times daily. 180 tablet 3   sennosides-docusate sodium (SENOKOT-S) 8.6-50 MG tablet Take 1 tablet by mouth daily.     spironolactone (ALDACTONE) 25 MG tablet TAKE 1 TABLET BY MOUTH EVERY DAY 30 tablet 11   traZODone (DESYREL) 100 MG tablet Take 100 mg by mouth at bedtime as needed for sleep.     warfarin (COUMADIN) 10 MG tablet TAKE 1/2 TABLET BY MOUTH EVERY DAY EXCEPT 1 TABLET ON SUNDAYS AND THURSDAYS 30 tablet 3   torsemide (DEMADEX) 20  MG tablet Take 3 tablets (60 mg total) by mouth daily. Take 1 extra tablet as needed 75 tablet 6   No current facility-administered medications for this visit.    Allergies:   Beef-derived products, Metoclopramide hcl, Penicillins, Pork-derived products, Metoprolol, Reglan [metoclopramide], and Statins   Social History: Social History   Socioeconomic History   Marital status: Single    Spouse name: Not on file   Number of children: Not on file   Years of education: Not on file   Highest education level: Not on file  Occupational History   Occupation: Disabled    Comment: ESL  Tobacco Use   Smoking status: Former    Packs/day: 0.25    Years: 43.00    Pack years: 10.75    Types: Cigarettes, E-cigarettes    Start date: 03/03/1978    Quit date: 07/21/2021    Years since quitting: 0.0   Smokeless tobacco: Never   Tobacco comments:    4 ciggs per day  Vaping Use   Vaping Use: Every day  Substance and Sexual Activity   Alcohol use: No    Alcohol/week: 0.0 standard drinks   Drug use: No    Comment: Prior history of cocaine   Sexual activity: Not Currently    Birth control/protection: None  Other Topics Concern   Not on file  Social History Narrative   Not on file    Social Determinants of Health   Financial Resource Strain: Low Risk    Difficulty of Paying Living Expenses: Not very hard  Food Insecurity: No Food Insecurity   Worried About Programme researcher, broadcasting/film/video in the Last Year: Never true   Ran Out of Food in the Last Year: Never true  Transportation Needs: No Transportation Needs   Lack of Transportation (Medical): No   Lack of Transportation (Non-Medical): No  Physical Activity: Not on file  Stress: Not on file  Social Connections: Not on file  Intimate Partner Violence: Not on file    Family History: Family History  Problem Relation Age of Onset   Uterine cancer Mother    Heart disease Father 52   Hyperlipidemia Father    Hypertension Father    Ankylosing spondylitis Sister    Heart disease Sister    Hypertension Sister    Stomach cancer Brother 93   Heart attack Brother     Review of Systems: Review of systems complete and found to be negative unless listed in HPI.     Physical Exam: Vitals:   08/03/21 1154  BP: 98/72  Pulse: (!) 57  SpO2: 98%  Weight: 191 lb 3.2 oz (86.7 kg)  Height: 5\' 5"  (1.651 m)      General:  Well appearing. No resp difficulty. HEENT: Normal Neck: Supple. JVP 5-6. Carotids 2+ bilat; no bruits. No thyromegaly or nodule noted. Cor: PMI nondisplaced. RRR, No M/G/R noted Lungs: CTAB, normal effort. Abdomen: Soft, non-tender, non-distended, no HSM. No bruits or masses. +BS   Extremities: No cyanosis, clubbing, or rash. R and LLE no edema.  Neuro: Alert & orientedx3, cranial nerves grossly intact. moves all 4 extremities w/o difficulty. Affect pleasant   ICD interrogation- No new therapy episodes on device. Full check not done given very recent check. Last NSVT 07/29/2021. Counters not cleared.  EKG:  EKG is not ordered today. The ekg ordered 07/12/21 shows sinus bradycardia at 58 bpm  Recent Labs: 03/01/2021: B Natriuretic Peptide 194.0 07/12/2021: Magnesium 2.0 07/23/2021: ALT 86; BUN 14;  Creatinine, Ser  1.10; Hemoglobin 14.2; Platelets 151; Potassium 4.2; Sodium 139; TSH 2.860   Wt Readings from Last 3 Encounters:  08/03/21 191 lb 3.2 oz (86.7 kg)  07/23/21 194 lb 6.4 oz (88.2 kg)  07/10/21 196 lb (88.9 kg)     Other studies Reviewed: Additional studies/ records that were reviewed today include: Previous EP office notes   Assessment and Plan:  1.  Chronic systolic dysfunction s/p [employer] dual chamber ICD  Volume status OK on exam NYHA III chronically  Stable on an appropriate medical regimen Normal ICD function See Pace Art report No changes today  2. VT Most recent middle of July. No new since last visit.  Attempt at VT ablation 03/08/2021 with scar homogenization. Pt had multiple VTs that were induced and hemodynamically unstable. They were not able to be ablated.  Continue imdur 30 mg daily.  Continue amiodarone and mexitil. Amio labs today 7/25 with mild uptrend in LFTs. Recheck CMET today.  She was previously on ranolazine and did not appear to derive benefit from it.  She understands that anytime she has device therapy her 6 months of driving restrictions restarts.   3. PVCs Stable at this time.  Not ablation candidate with multiple morphologies and with hemodynamically unstable VT during previous ablation attempt.   4. CAD with stable angina HS trop flat in ED several weeks ago.  Continue imdur 30 mg daily.   Reviewed sliding scale diuretics and that with her overall condition there may be days where she feels below her baseline. Reviewed alarm symptoms today.   Current medicines are reviewed at length with the patient today.   The patient does not have concerns regarding her medicines.  The following changes were made today:  imdur increased  Labs/ tests ordered today include:  Orders Placed This Encounter  Procedures   Comprehensive metabolic panel   Magnesium    Disposition:   Follow up with Dr. Ladona Ridgel  3 months. Sooner with  recurrent shocks.  Dustin Flock, PA-C  08/03/2021 12:19 PM  [facility] [address] [place] [address] [phone] (office) [phone] (fax)

## 2021-08-03 ENCOUNTER — Other Ambulatory Visit: Payer: Self-pay

## 2021-08-03 ENCOUNTER — Ambulatory Visit (INDEPENDENT_AMBULATORY_CARE_PROVIDER_SITE_OTHER): Payer: Medicare PPO | Admitting: Student

## 2021-08-03 ENCOUNTER — Encounter: Payer: Self-pay | Admitting: Student

## 2021-08-03 VITALS — BP 98/72 | HR 57 | Ht 65.0 in | Wt 191.2 lb

## 2021-08-03 DIAGNOSIS — I480 Paroxysmal atrial fibrillation: Secondary | ICD-10-CM | POA: Diagnosis not present

## 2021-08-03 DIAGNOSIS — I2510 Atherosclerotic heart disease of native coronary artery without angina pectoris: Secondary | ICD-10-CM

## 2021-08-03 DIAGNOSIS — I5022 Chronic systolic (congestive) heart failure: Secondary | ICD-10-CM

## 2021-08-03 DIAGNOSIS — I472 Ventricular tachycardia, unspecified: Secondary | ICD-10-CM

## 2021-08-03 MED ORDER — TORSEMIDE 20 MG PO TABS: 60.0000 mg | ORAL_TABLET | Freq: Every day | ORAL | 6 refills | Status: DC

## 2021-08-03 NOTE — Patient Instructions (Signed)
Medication Instructions:  Your physician recommends that you continue on your current medications as directed. Please refer to the Current Medication list given to you today.  *If you need a refill on your cardiac medications before your next appointment, please call your pharmacy*   Lab Work: TODAY: CMET, Mg  If you have labs (blood work) drawn today and your tests are completely normal, you will receive your results only by: MyChart Message (if you have MyChart) OR A paper copy in the mail If you have any lab test that is abnormal or we need to change your treatment, we will call you to review the results.   Follow-Up: At [facility], you and your health needs are our priority.  As part of our continuing mission to provide you with exceptional heart care, we have created designated Provider Care Teams.  These Care Teams include your primary Cardiologist (physician) and Advanced Practice Providers (APPs -  Physician Assistants and Nurse Practitioners) who all work together to provide you with the care you need, when you need it.   Your next appointment:   3 month(s)  The format for your next appointment:   In Person  Provider:   You may see Lewayne Bunting, MD or one of the following Advanced Practice Providers on your designated Care Team:    Casimiro Needle "Mardelle Matte" [place], [place]

## 2021-08-04 LAB — COMPREHENSIVE METABOLIC PANEL
ALT: 32 IU/L (ref 0–32)
AST: 23 IU/L (ref 0–40)
Albumin/Globulin Ratio: 1.5 (ref 1.2–2.2)
Albumin: 4.3 g/dL (ref 3.8–4.9)
Alkaline Phosphatase: 124 IU/L — ABNORMAL HIGH (ref 44–121)
BUN/Creatinine Ratio: 21 (ref 9–23)
BUN: 23 mg/dL (ref 6–24)
Bilirubin Total: 0.2 mg/dL (ref 0.0–1.2)
CO2: 30 mmol/L — ABNORMAL HIGH (ref 20–29)
Calcium: 8.6 mg/dL — ABNORMAL LOW (ref 8.7–10.2)
Chloride: 96 mmol/L (ref 96–106)
Creatinine, Ser: 1.08 mg/dL — ABNORMAL HIGH (ref 0.57–1.00)
Globulin, Total: 2.9 g/dL (ref 1.5–4.5)
Glucose: 74 mg/dL (ref 65–99)
Potassium: 4.3 mmol/L (ref 3.5–5.2)
Sodium: 141 mmol/L (ref 134–144)
Total Protein: 7.2 g/dL (ref 6.0–8.5)
eGFR: 60 mL/min/{1.73_m2} (ref >59)

## 2021-08-04 LAB — MAGNESIUM: Magnesium: 2.4 mg/dL — ABNORMAL HIGH (ref 1.6–2.3)

## 2021-08-06 NOTE — Telephone Encounter (Signed)
I spoke with the patient. Her monitor is up to date. I called [employer] tech support with her on the phone. They did some trouble shooting and asked the patient to call them tomorrow at [phone].

## 2021-08-06 NOTE — Telephone Encounter (Signed)
Spoke with pt and is feeling better now.Reviewed note from Friday and appears pt has been taking Ntg as needed for some time now.The patient is on Isosorbide 30 mg qd and B/P's have been running 120/75 is the highest generally lower and suffers orthostatic hypotension Pt feels that if had  the at home device equipment [employer] ) would maybe not have these episodes feels maybe sometimes she is having  panic attacks Will forward to Dr Ladona Ridgel and device clinic /cy  Pt needs the equipment at home for device.

## 2021-08-14 ENCOUNTER — Encounter (HOSPITAL_COMMUNITY): Payer: Self-pay

## 2021-08-14 ENCOUNTER — Encounter (HOSPITAL_COMMUNITY): Payer: Medicare PPO | Admitting: Cardiology

## 2021-08-16 ENCOUNTER — Other Ambulatory Visit: Payer: Self-pay

## 2021-08-16 MED ORDER — TORSEMIDE 20 MG PO TABS: 60.0000 mg | ORAL_TABLET | Freq: Every day | ORAL | 6 refills | Status: DC

## 2021-08-22 ENCOUNTER — Encounter: Payer: Medicare PPO | Admitting: Family Medicine

## 2021-08-27 ENCOUNTER — Ambulatory Visit (INDEPENDENT_AMBULATORY_CARE_PROVIDER_SITE_OTHER): Payer: Medicare PPO | Admitting: *Deleted

## 2021-08-27 ENCOUNTER — Other Ambulatory Visit: Payer: Self-pay

## 2021-08-27 DIAGNOSIS — D6861 Antiphospholipid syndrome: Secondary | ICD-10-CM

## 2021-08-27 DIAGNOSIS — Z5181 Encounter for therapeutic drug level monitoring: Secondary | ICD-10-CM

## 2021-08-27 DIAGNOSIS — D6859 Other primary thrombophilia: Secondary | ICD-10-CM

## 2021-08-27 DIAGNOSIS — I63549 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: Secondary | ICD-10-CM

## 2021-08-27 LAB — POCT INR: INR: 4.2 — AB (ref 2.0–3.0)

## 2021-08-27 MED ORDER — WARFARIN SODIUM 5 MG PO TABS
5.0000 mg | ORAL_TABLET | Freq: Every day | ORAL | 6 refills | Status: DC
Start: 2021-08-27 — End: 2022-03-14

## 2021-08-27 NOTE — Patient Instructions (Signed)
CHANGE WARFARIN TO 5MG  TABLET On amiodarone 200mg  twice a day Hold warfarin tonight then decrease dose to 1 tablet daily except 1/2 tablet on Tuesdays and Fridays  Recheck in 2 wk

## 2021-08-31 ENCOUNTER — Ambulatory Visit (INDEPENDENT_AMBULATORY_CARE_PROVIDER_SITE_OTHER): Payer: Medicare PPO

## 2021-08-31 DIAGNOSIS — I255 Ischemic cardiomyopathy: Secondary | ICD-10-CM

## 2021-09-04 LAB — CUP PACEART REMOTE DEVICE CHECK
Battery Remaining Longevity: 108 mo
Battery Remaining Percentage: 100 %
Brady Statistic RA Percent Paced: 8 %
Brady Statistic RV Percent Paced: 4 %
Date Time Interrogation Session: 20220902021100
HighPow Impedance: 51 Ohm
Implantable Lead Implant Date: 20081119
Implantable Lead Implant Date: 20081119
Implantable Lead Location: 753859
Implantable Lead Location: 753860
Implantable Lead Model: 185
Implantable Lead Model: 4136
Implantable Lead Serial Number: 211124
Implantable Lead Serial Number: 28383796
Implantable Pulse Generator Implant Date: 20170303
Lead Channel Impedance Value: 464 Ohm
Lead Channel Impedance Value: 540 Ohm
Lead Channel Pacing Threshold Amplitude: 0.7 V
Lead Channel Pacing Threshold Amplitude: 1.2 V
Lead Channel Pacing Threshold Pulse Width: 0.5 ms
Lead Channel Pacing Threshold Pulse Width: 0.5 ms
Lead Channel Setting Pacing Amplitude: 2 V
Lead Channel Setting Pacing Amplitude: 2.4 V
Lead Channel Setting Pacing Pulse Width: 0.5 ms
Lead Channel Setting Sensing Sensitivity: 0.6 mV
Pulse Gen Serial Number: 204398

## 2021-09-10 ENCOUNTER — Ambulatory Visit (INDEPENDENT_AMBULATORY_CARE_PROVIDER_SITE_OTHER): Payer: Medicare PPO | Admitting: *Deleted

## 2021-09-10 DIAGNOSIS — I63549 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: Secondary | ICD-10-CM

## 2021-09-10 DIAGNOSIS — D6859 Other primary thrombophilia: Secondary | ICD-10-CM

## 2021-09-10 DIAGNOSIS — Z5181 Encounter for therapeutic drug level monitoring: Secondary | ICD-10-CM

## 2021-09-10 DIAGNOSIS — D6861 Antiphospholipid syndrome: Secondary | ICD-10-CM | POA: Diagnosis not present

## 2021-09-10 LAB — POCT INR: INR: 2.8 (ref 2.0–3.0)

## 2021-09-10 NOTE — Patient Instructions (Signed)
WARFARIN TO 5MG  TABLET On amiodarone 200mg  twice a day Continue warfarin 1 tablet daily except 1/2 tablet on Tuesdays and Fridays  Recheck in 4 wk

## 2021-09-12 NOTE — Progress Notes (Signed)
Remote ICD transmission.   

## 2021-09-27 DIAGNOSIS — G4700 Insomnia, unspecified: Secondary | ICD-10-CM | POA: Diagnosis not present

## 2021-09-27 DIAGNOSIS — M545 Low back pain, unspecified: Secondary | ICD-10-CM | POA: Diagnosis not present

## 2021-09-27 DIAGNOSIS — G8912 Acute post-thoracotomy pain: Secondary | ICD-10-CM | POA: Diagnosis not present

## 2021-09-27 DIAGNOSIS — G8929 Other chronic pain: Secondary | ICD-10-CM | POA: Diagnosis not present

## 2021-09-27 DIAGNOSIS — M25569 Pain in unspecified knee: Secondary | ICD-10-CM | POA: Diagnosis not present

## 2021-09-27 DIAGNOSIS — Z79891 Long term (current) use of opiate analgesic: Secondary | ICD-10-CM | POA: Diagnosis not present

## 2021-10-03 ENCOUNTER — Encounter (HOSPITAL_COMMUNITY): Payer: Medicare PPO | Admitting: Cardiology

## 2021-10-08 ENCOUNTER — Ambulatory Visit (INDEPENDENT_AMBULATORY_CARE_PROVIDER_SITE_OTHER): Payer: Medicare PPO | Admitting: *Deleted

## 2021-10-08 DIAGNOSIS — I63549 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: Secondary | ICD-10-CM | POA: Diagnosis not present

## 2021-10-08 DIAGNOSIS — D6859 Other primary thrombophilia: Secondary | ICD-10-CM | POA: Diagnosis not present

## 2021-10-08 DIAGNOSIS — Z5181 Encounter for therapeutic drug level monitoring: Secondary | ICD-10-CM | POA: Diagnosis not present

## 2021-10-08 LAB — POCT INR: INR: 4 — AB (ref 2.0–3.0)

## 2021-10-08 NOTE — Patient Instructions (Signed)
WARFARIN TO 5MG  TABLET On amiodarone 200mg  twice a day Hold warfarin tonight then decrease dose to 1 tablet daily except 1/2 tablet on Sundays, Tuesdays and Thursdays  Recheck in 2 wk

## 2021-10-09 ENCOUNTER — Telehealth: Payer: Self-pay

## 2021-10-09 ENCOUNTER — Ambulatory Visit (INDEPENDENT_AMBULATORY_CARE_PROVIDER_SITE_OTHER): Payer: Medicare PPO | Admitting: Family Medicine

## 2021-10-09 ENCOUNTER — Other Ambulatory Visit (HOSPITAL_COMMUNITY)
Admission: RE | Admit: 2021-10-09 | Discharge: 2021-10-09 | Disposition: A | Discharge status: Home / Self Care | Payer: Medicare PPO | Source: Ambulatory Visit | Attending: Family Medicine | Admitting: Family Medicine

## 2021-10-09 ENCOUNTER — Other Ambulatory Visit: Payer: Self-pay

## 2021-10-09 VITALS — BP 116/82 | HR 83 | Temp 97.9°F | Wt 227.0 lb

## 2021-10-09 DIAGNOSIS — Z0000 Encounter for general adult medical examination without abnormal findings: Secondary | ICD-10-CM

## 2021-10-09 DIAGNOSIS — Z113 Encounter for screening for infections with a predominantly sexual mode of transmission: Secondary | ICD-10-CM | POA: Insufficient documentation

## 2021-10-09 DIAGNOSIS — Z01419 Encounter for gynecological examination (general) (routine) without abnormal findings: Secondary | ICD-10-CM | POA: Diagnosis not present

## 2021-10-09 DIAGNOSIS — Z0001 Encounter for general adult medical examination with abnormal findings: Secondary | ICD-10-CM

## 2021-10-09 DIAGNOSIS — Z124 Encounter for screening for malignant neoplasm of cervix: Secondary | ICD-10-CM | POA: Diagnosis not present

## 2021-10-09 DIAGNOSIS — Z23 Encounter for immunization: Secondary | ICD-10-CM

## 2021-10-09 DIAGNOSIS — I5042 Chronic combined systolic (congestive) and diastolic (congestive) heart failure: Secondary | ICD-10-CM

## 2021-10-09 DIAGNOSIS — Z1151 Encounter for screening for human papillomavirus (HPV): Secondary | ICD-10-CM | POA: Diagnosis not present

## 2021-10-09 DIAGNOSIS — F4310 Post-traumatic stress disorder, unspecified: Secondary | ICD-10-CM

## 2021-10-09 MED ORDER — ALPRAZOLAM 0.25 MG PO TABS: 0.2500 mg | ORAL_TABLET | Freq: Every day | ORAL | 2 refills | Status: DC | PRN

## 2021-10-09 NOTE — Telephone Encounter (Signed)
"  [employer] alert for VT/ATP episodes occurring on 10/04/21 between 1302-1317 (8 episodes) with 4 EGM's available for review showing 1st episode clean termination with 1st ATP with avg VR 135bpm. Other 3 episodes would have approx 1 sinus beat, then recurrent slow VT (rate range 130's) with term on last episode at 1312. OAC- Warfarin, on Amiodarone, Coreg. Routing for further review"   Successful telephone encounter to patient to follow up on s/s VT/ATP. Per patient she was unaware of arhythmias. Medications were reviewed and it was discovered that she has not been taking her amio as prescribed. On 06/06/21, she was previously prescribed amio 400mg  daily M-F and 200mg  S-S. She never changed her dosages and for the last 2 months has been taking 400mg  daily. On 10/1 and 10/2 patient took 200mg  daily and resumed 400 mg on 10/3-10/7. She is now taking as prescribed however is concerned that the reduction may have caused ATP on 10/6. Will route to Dr. 12/2 for review and advisement. NCD[employer] driving restrictions and shock plan reviewed.

## 2021-10-09 NOTE — Progress Notes (Addendum)
Assessment & Plan:  1. Well adult exam Preventive health education provided. Cologuard report printed from their website to be scanned into her chart. - CBC with Differential/Platelet - CMP14+EGFR - Lipid panel  2. Chronic combined systolic (congestive) and diastolic (congestive) heart failure (HCC) Well controlled on current regimen.  - carvedilol (COREG) 12.5 MG tablet; Take 12.5 mg by mouth 2 (two) times daily with a meal. - CBC with Differential/Platelet - CMP14+EGFR - Lipid panel - Magnesium  3. PTSD (post-traumatic stress disorder) Discussed I want her to try to calm herself down with deep breathing exercises before taking a Xanax and suggested she try this for 10 minutes before taking. Will complete controlled substance agreement and urine drug screen at follow-up if we are going to continue based on usage.  - ALPRAZolam (XANAX) 0.25 MG tablet; Take 1 tablet (0.25 mg total) by mouth daily as needed for anxiety.  Dispense: 15 tablet; Refill: 2  4. Screening for cervical cancer - Cytology - PAP  5. Routine screening for STI (sexually transmitted infection) - Cytology - PAP  6. Screening for human papillomavirus (HPV) - Cytology - PAP  7. Encounter for immunization - Flu Vaccine MDCK QUAD PF - given in office.   Follow-up: Return in about 2 months (around 12/09/2021) for Anxiety.   Hendricks Limes, MSN, APRN, FNP-C [facility]  Subjective:  Patient ID: [name] [name], female    DOB: November 05, 1961  Age: 60 y.o. MRN: [id number]  Patient Care Team: Loman Brooklyn, FNP as PCP - General (Family Medicine) Satira Sark, MD as PCP - Cardiology (Cardiology) Evans Lance, MD as PCP - Electrophysiology (Cardiology) Phillips Odor, MD as Consulting Physician (Neurology) Larey Dresser, MD as Consulting Physician (Cardiology) Ladell Pier, MD as Consulting Physician (Oncology)   CC:  Chief Complaint  Patient presents with   Gynecologic  Exam    HPI [name] [name] presents for her annual physical.  Occupation: disabled, Marital status: single, Substance use: None Diet: Regular, Exercise: Very little due to V-tach and back spasms Last eye exam: [facility] 2 years ago Last dental exam: 8 months ago Last colonoscopy: Declined Last mammogram: 06/21/2020; declined to repeat at this time. Patient reports she does self breast exams at home and only gets mammograms every 4-5 years.  Last pap smear: 12 years ago per patient Lung Cancer Screening with low-dose Chest CT: declined Hepatitis C Screening: Declined Immunizations: Flu Vaccine:  will get today Tdap Vaccine: up to date  Shingrix Vaccine: declined  COVID-19 Vaccine: up to date Pneumonia Vaccine: up to date  DEPRESSION SCREENING PHQ 2/9 Scores 10/09/2021 07/10/2021 05/22/2021 03/06/2021  PHQ - 2 Score 1 2 0 0  PHQ- 9 Score 3 8 - -     Patient previously had a prescription for Xanax that she was using for anxiety. She was given hydroxyzine to try at our last visit which she reports was not helpful. Her anxiety increases dramatically when her heart feels different or her implantable ICD takes a reading. She is afraid of getting shocked again or of going into cardiac arrest again. She is able to calm back down most of the time with deep breathing exercises. These attacks typically last 5-10 minutes and occur several times per week. She is on Cymbalta daily which does help with overall anxiety.    Review of Systems  Constitutional:  Negative for chills, fever, malaise/fatigue and weight loss.  HENT:  Negative for congestion, ear discharge, ear pain,  nosebleeds, sinus pain, sore throat and tinnitus.   Eyes:  Negative for blurred vision, double vision, pain, discharge and redness.  Respiratory:  Positive for shortness of breath. Negative for cough and wheezing.   Cardiovascular:  Positive for chest pain and palpitations. Negative for leg swelling.  Gastrointestinal:   Negative for abdominal pain, constipation, diarrhea, heartburn, nausea and vomiting.  Genitourinary:  Negative for dysuria, frequency and urgency.  Musculoskeletal:  Negative for myalgias.  Skin:  Negative for rash.  Neurological:  Negative for dizziness, seizures, weakness and headaches.  Psychiatric/Behavioral:  Negative for depression, substance abuse and suicidal ideas. The patient is nervous/anxious.     Current Outpatient Medications:    amiodarone (PACERONE) 200 MG tablet, Take 1 tablet (200 mg total) by mouth 2 (two) times daily. Take 400 mg (2 tablets) Monday thru Friday, On Saturday and Sunday take 200 mg (1 tablet), Disp: 60 tablet, Rfl: 1   carvedilol (COREG) 12.5 MG tablet, Take 12.5 mg by mouth 2 (two) times daily with a meal., Disp: , Rfl:    clopidogrel (PLAVIX) 75 MG tablet, Take 1 tablet (75 mg total) by mouth daily with breakfast., Disp: 30 tablet, Rfl: 11   DULoxetine (CYMBALTA) 60 MG capsule, Take 60 mg by mouth daily., Disp: , Rfl:    empagliflozin (JARDIANCE) 10 MG TABS tablet, Take 1 tablet (10 mg total) by mouth daily before breakfast., Disp: 30 tablet, Rfl: 3   EPINEPHrine 0.3 mg/0.3 mL IJ SOAJ injection, Inject 0.3 mg into the muscle as needed., Disp: , Rfl:    ezetimibe (ZETIA) 10 MG tablet, TAKE 1 TABLET BY MOUTH EVERY DAY, Disp: 30 tablet, Rfl: 5   isosorbide mononitrate (IMDUR) 30 MG 24 hr tablet, Take 1 tablet (30 mg total) by mouth daily., Disp: 90 tablet, Rfl: 3   methocarbamol (ROBAXIN) 750 MG tablet, Take 1 tablet (750 mg total) by mouth every 8 (eight) hours as needed for muscle spasms., Disp: 45 tablet, Rfl: 0   mexiletine (MEXITIL) 200 MG capsule, Take 1 capsule (200 mg total) by mouth every 8 (eight) hours., Disp: 90 capsule, Rfl: 3   Multiple Vitamins-Minerals (MULTIVITAMINS THER. W/MINERALS) TABS, Take 1 tablet by mouth daily.  , Disp: , Rfl:    nitroGLYCERIN (NITROSTAT) 0.4 MG SL tablet, PLACE 1 TABLET UNDER THE TONGUE EVERY 5 MINUTES FOR 3 DOSES AS  NEEDED CHEST PAIN, Disp: 25 tablet, Rfl: 3   oxyCODONE-acetaminophen (PERCOCET) 10-325 MG tablet, Take 1 tablet by mouth every 4 (four) hours as needed for pain., Disp: , Rfl:    pantoprazole (PROTONIX) 40 MG tablet, Take 1 tablet (40 mg total) by mouth daily., Disp: 30 tablet, Rfl: 11   potassium chloride SA (KLOR-CON) 20 MEQ tablet, Take 2 tablets (40 mEq total) by mouth daily., Disp: 180 tablet, Rfl: 1   sacubitril-valsartan (ENTRESTO) 24-26 MG, Take 1 tablet by mouth 2 (two) times daily., Disp: 180 tablet, Rfl: 3   sennosides-docusate sodium (SENOKOT-S) 8.6-50 MG tablet, Take 1 tablet by mouth daily., Disp: , Rfl:    spironolactone (ALDACTONE) 25 MG tablet, TAKE 1 TABLET BY MOUTH EVERY DAY, Disp: 30 tablet, Rfl: 11   torsemide (DEMADEX) 20 MG tablet, Take 3 tablets (60 mg total) by mouth daily. Take 1 extra tablet as needed, Disp: 120 tablet, Rfl: 6   traZODone (DESYREL) 100 MG tablet, Take 100 mg by mouth at bedtime as needed for sleep., Disp: , Rfl:    warfarin (COUMADIN) 5 MG tablet, Take 1 tablet (5 mg total) by mouth  daily at 4 PM., Disp: 30 tablet, Rfl: 6   ALPRAZolam (XANAX) 0.25 MG tablet, Take 1 tablet (0.25 mg total) by mouth daily as needed for anxiety., Disp: 15 tablet, Rfl: 2  Allergies  Allergen Reactions   Beef-Derived Products Anaphylaxis    From tick bite   Metoclopramide Hcl Anaphylaxis    Non responsive   Penicillins Anaphylaxis    Pt states she can take any cephalosporin (per pt. 01/01/19) Shock Has patient had a PCN reaction causing immediate rash, facial/tongue/throat swelling, SOB or lightheadedness with hypotension: Yes Has patient had a PCN reaction causing severe rash involving mucus membranes or skin necrosis: No Has patient had a PCN reaction that required hospitalization Yes Has patient had a PCN reaction occurring within the last 10 years: No If all of the above answers are "NO", then may proceed with Cephalosporin    Pork-Derived Products Anaphylaxis     From tick bite. Has tolerated heparin/lovenox   Metoprolol Other (See Comments)    Syncope    Reglan [Metoclopramide]    Statins Other (See Comments)    Myalgias    Past Medical History:  Diagnosis Date   Aneurysm of internal carotid artery    Anti-phospholipid syndrome (HCC)    Anticardiolipin antibody positive    Chronic Coumadin   Brain aneurysm    followed by Dr. Estanislado Pandy   Chronic combined systolic (congestive) and diastolic (congestive) heart failure (HCC)    Cocaine abuse in remission [facility])    Coronary atherosclerosis of native coronary artery    Previous invasive cardiac testing was done at a facility in [place], [place].  Occluded diagonal, Diffuse LAD disease, Occluded Cx, and non obstructive RCA.    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 Trenton Gammon L1 cage, 5 disc fusion   CARDIAC DEFIBRILLATOR PLACEMENT  2008   [employer] ICD   CARDIOVERSION N/A 12/01/2020   Procedure: CARDIOVERSION;  Surgeon: Larey Dresser, MD;  Location: [facility] ENDOSCOPY;  Service: Cardiovascular;  Laterality: N/A;   CORONARY STENT INTERVENTION N/A 01/08/2021   Procedure: CORONARY STENT INTERVENTION;  Surgeon: Martinique, Peter M, MD;  Location: [place] CV LAB;  Service: Cardiovascular;  Laterality: N/A;   EP IMPLANTABLE DEVICE N/A 03/01/2016    Procedure: ICD Generator Changeout;  Surgeon: Evans Lance, MD;  Location: [place] CV LAB;  Service: Cardiovascular;  Laterality: N/A;   ICD/BIV ICD FUNCTION(DFT) TEST N/A 03/14/2021   Procedure: ICD/BIV ICD FUNCTION (DFT) TEST;  Surgeon: Evans Lance, MD;  Location: [place] CV LAB;  Service: Cardiovascular;  Laterality: N/A;   L1 corpectomy   11/29/2009   Laryngeal polyp excision     RIGHT/LEFT HEART CATH AND CORONARY ANGIOGRAPHY N/A 12/18/2020   Procedure: RIGHT/LEFT HEART CATH AND CORONARY ANGIOGRAPHY;  Surgeon:  Bensimhon, Shaune Pascal, MD;  Location: [place] CV LAB;  Service: Cardiovascular;  Laterality: N/A;   RIGHT/LEFT HEART CATH AND CORONARY ANGIOGRAPHY N/A 03/05/2021   Procedure: RIGHT/LEFT HEART CATH AND CORONARY ANGIOGRAPHY;  Surgeon: Larey Dresser, MD;  Location: [place] CV LAB;  Service: Cardiovascular;  Laterality: N/A;   TEE WITHOUT CARDIOVERSION N/A 12/01/2020   Procedure: TRANSESOPHAGEAL ECHOCARDIOGRAM (TEE);  Surgeon: Larey Dresser, MD;  Location: [facility] ENDOSCOPY;  Service: Cardiovascular;  Laterality: N/AStephanie Coup ABLATION N/A 03/08/2021   Procedure: Stephanie Coup ABLATION;  Surgeon: Vickie Epley, MD;  Location: [place] CV LAB;  Service: Cardiovascular;  Laterality: N/A;    Family History  Problem Relation Age of Onset   Uterine cancer Mother    Heart disease Father 46   Hyperlipidemia Father    Hypertension Father    Ankylosing spondylitis Sister    Heart disease Sister    Hypertension Sister    Stomach cancer Brother 87   Heart attack Brother     Social History   Socioeconomic History   Marital status: Single    Spouse name: Not on file   Number of children: Not on file   Years of education: Not on file   Highest education level: Not on file  Occupational History   Occupation: Disabled    Comment: ESL  Tobacco Use   Smoking status: Former    Packs/day: 0.25    Years: 43.00    Pack years: 10.75    Types: Cigarettes, E-cigarettes     Start date: 03/03/1978    Quit date: 07/21/2021    Years since quitting: 0.2   Smokeless tobacco: Never   Tobacco comments:    4 ciggs per day  Vaping Use   Vaping Use: Every day  Substance and Sexual Activity   Alcohol use: No    Alcohol/week: 0.0 standard drinks   Drug use: No    Comment: Prior history of cocaine   Sexual activity: Not Currently    Birth control/protection: None  Other Topics Concern   Not on file  Social History Narrative   Not on file   Social Determinants of Health   Financial Resource Strain: Low Risk    Difficulty of Paying Living Expenses: Not very hard  Food Insecurity: No Food Insecurity   Worried About Charity fundraiser in the Last Year: Never true   Ran Out of Food in the Last Year: Never true  Transportation Needs: No Transportation Needs   Lack of Transportation (Medical): No   Lack of Transportation (Non-Medical): No  Physical Activity: Not on file  Stress: Not on file  Social Connections: Not on file  Intimate Partner Violence: Not on file      Objective:    BP 116/82   Pulse 83   Temp 97.9 F (36.6 C)   Wt 227 lb (103 kg)   SpO2 98%   BMI 37.77 kg/m   Wt Readings from Last 3 Encounters:  10/09/21 227 lb (103 kg)  08/03/21 191 lb 3.2 oz (86.7 kg)  07/23/21 194 lb 6.4 oz (88.2 kg)    Physical Exam Vitals reviewed. Exam conducted with a chaperone present.  Constitutional:      General: She is not in acute distress.    Appearance: Normal appearance. She is not ill-appearing, toxic-appearing or diaphoretic.  HENT:     Head: Normocephalic and atraumatic.     Right Ear: Tympanic membrane, ear canal and external ear normal.  There is no impacted cerumen.     Left Ear: Tympanic membrane, ear canal and external ear normal. There is no impacted cerumen.     Nose: Nose normal. No congestion or rhinorrhea.     Mouth/Throat:     Mouth: Mucous membranes are moist.     Pharynx: Oropharynx is clear. No oropharyngeal exudate or  posterior oropharyngeal erythema.  Eyes:     General: No scleral icterus.       Right eye: No discharge.        Left eye: No discharge.     Conjunctiva/sclera: Conjunctivae normal.     Pupils: Pupils are equal, round, and reactive to light.  Cardiovascular:     Rate and Rhythm: Normal rate and regular rhythm.     Heart sounds: Normal heart sounds. No murmur heard.   No friction rub. No gallop.  Pulmonary:     Effort: Pulmonary effort is normal. No respiratory distress.     Breath sounds: Normal breath sounds. No stridor. No wheezing, rhonchi or rales.  Abdominal:     General: Abdomen is flat. Bowel sounds are normal. There is no distension.     Palpations: Abdomen is soft. There is no hepatomegaly, splenomegaly or mass.     Tenderness: There is no abdominal tenderness. There is no guarding or rebound.     Hernia: No hernia is present.  Genitourinary:    Pubic Area: No rash or pubic lice.      Labia:        Right: No rash, tenderness, lesion or injury.        Left: No rash, tenderness, lesion or injury.      Vagina: Normal.     Cervix: Normal.     Uterus: Normal.      Adnexa: Right adnexa normal and left adnexa normal.  Musculoskeletal:        General: Normal range of motion.     Cervical back: Normal range of motion and neck supple. No rigidity. No muscular tenderness.  Lymphadenopathy:     Cervical: No cervical adenopathy.  Skin:    General: Skin is warm and dry.     Capillary Refill: Capillary refill takes less than 2 seconds.  Neurological:     General: No focal deficit present.     Mental Status: She is alert and oriented to person, place, and time. Mental status is at baseline.  Psychiatric:        Mood and Affect: Mood normal.        Behavior: Behavior normal.        Thought Content: Thought content normal.        Judgment: Judgment normal.    Lab Results  Component Value Date   TSH 2.860 07/23/2021   Lab Results  Component Value Date   WBC 6.6 10/09/2021    HGB 14.1 10/09/2021   HCT 43.4 10/09/2021   MCV 90 10/09/2021   PLT 114 (L) 10/09/2021   Lab Results  Component Value Date   NA 136 10/09/2021   K 4.3 10/09/2021   CO2 27 10/09/2021   GLUCOSE 90 10/09/2021   BUN 15 10/09/2021   CREATININE 1.11 (H) 10/09/2021   BILITOT <0.2 10/09/2021   ALKPHOS 137 (H) 10/09/2021   AST 74 (H) 10/09/2021   ALT 76 (H) 10/09/2021   PROT 6.8 10/09/2021   ALBUMIN 3.9 10/09/2021   CALCIUM 8.2 (L) 10/09/2021   ANIONGAP 12 07/12/2021   EGFR 58 (L) 10/09/2021   Lab  Results  Component Value Date   CHOL 173 10/09/2021   Lab Results  Component Value Date   HDL 44 10/09/2021   Lab Results  Component Value Date   LDLCALC 104 (H) 10/09/2021   Lab Results  Component Value Date   TRIG 138 10/09/2021   Lab Results  Component Value Date   CHOLHDL 3.9 10/09/2021   Lab Results  Component Value Date   HGBA1C 5.2 05/22/2021

## 2021-10-10 ENCOUNTER — Encounter: Payer: Self-pay | Admitting: Family Medicine

## 2021-10-10 DIAGNOSIS — F4310 Post-traumatic stress disorder, unspecified: Secondary | ICD-10-CM | POA: Insufficient documentation

## 2021-10-10 LAB — CBC WITH DIFFERENTIAL/PLATELET
Basophils Absolute: 0.1 10*3/uL (ref 0.0–0.2)
Basos: 1 %
EOS (ABSOLUTE): 0.2 10*3/uL (ref 0.0–0.4)
Eos: 3 %
Hematocrit: 43.4 % (ref 34.0–46.6)
Hemoglobin: 14.1 g/dL (ref 11.1–15.9)
Immature Grans (Abs): 0 10*3/uL (ref 0.0–0.1)
Immature Granulocytes: 0 %
Lymphocytes Absolute: 1.8 10*3/uL (ref 0.7–3.1)
Lymphs: 27 %
MCH: 29.4 pg (ref 26.6–33.0)
MCHC: 32.5 g/dL (ref 31.5–35.7)
MCV: 90 fL (ref 79–97)
Monocytes Absolute: 0.6 10*3/uL (ref 0.1–0.9)
Monocytes: 10 %
Neutrophils Absolute: 3.9 10*3/uL (ref 1.4–7.0)
Neutrophils: 59 %
Platelets: 114 10*3/uL — ABNORMAL LOW (ref 150–450)
RBC: 4.8 x10E6/uL (ref 3.77–5.28)
RDW: 12.6 % (ref 11.7–15.4)
WBC: 6.6 10*3/uL (ref 3.4–10.8)

## 2021-10-10 LAB — CMP14+EGFR
ALT: 76 IU/L — ABNORMAL HIGH (ref 0–32)
AST: 74 IU/L — ABNORMAL HIGH (ref 0–40)
Albumin/Globulin Ratio: 1.3 (ref 1.2–2.2)
Albumin: 3.9 g/dL (ref 3.8–4.9)
Alkaline Phosphatase: 137 IU/L — ABNORMAL HIGH (ref 44–121)
BUN/Creatinine Ratio: 14 (ref 9–23)
BUN: 15 mg/dL (ref 6–24)
Bilirubin Total: <0.2 mg/dL (ref 0.0–1.2)
CO2: 27 mmol/L (ref 20–29)
Calcium: 8.2 mg/dL — ABNORMAL LOW (ref 8.7–10.2)
Chloride: 96 mmol/L (ref 96–106)
Creatinine, Ser: 1.11 mg/dL — ABNORMAL HIGH (ref 0.57–1.00)
Globulin, Total: 2.9 g/dL (ref 1.5–4.5)
Glucose: 90 mg/dL (ref 70–99)
Potassium: 4.3 mmol/L (ref 3.5–5.2)
Sodium: 136 mmol/L (ref 134–144)
Total Protein: 6.8 g/dL (ref 6.0–8.5)
eGFR: 58 mL/min/{1.73_m2} — ABNORMAL LOW (ref >59)

## 2021-10-10 LAB — LIPID PANEL
Chol/HDL Ratio: 3.9 ratio (ref 0.0–4.4)
Cholesterol, Total: 173 mg/dL (ref 100–199)
HDL: 44 mg/dL (ref >39)
LDL Chol Calc (NIH): 104 mg/dL — ABNORMAL HIGH (ref 0–99)
Triglycerides: 138 mg/dL (ref 0–149)
VLDL Cholesterol Cal: 25 mg/dL (ref 5–40)

## 2021-10-10 LAB — MAGNESIUM: Magnesium: 2 mg/dL (ref 1.6–2.3)

## 2021-10-11 LAB — CYTOLOGY - PAP
Adequacy: ABSENT
Chlamydia: NEGATIVE
Comment: NEGATIVE
Comment: NEGATIVE
Comment: NEGATIVE
Comment: NORMAL
Diagnosis: NEGATIVE
High risk HPV: NEGATIVE
Neisseria Gonorrhea: NEGATIVE
Trichomonas: NEGATIVE

## 2021-10-12 ENCOUNTER — Encounter: Payer: Self-pay | Admitting: Family Medicine

## 2021-10-14 ENCOUNTER — Other Ambulatory Visit (HOSPITAL_COMMUNITY): Payer: Self-pay | Admitting: Cardiology

## 2021-10-19 ENCOUNTER — Telehealth: Payer: Self-pay

## 2021-10-19 ENCOUNTER — Encounter (HOSPITAL_COMMUNITY): Payer: Self-pay

## 2021-10-19 NOTE — Telephone Encounter (Signed)
"  BSC alert for VT with ATP therapy Event occurred 10/16 @ 14:43, EGM shows VT-1, avg rate 128 with ATP x3 without termination.  Second event @ 14:45 VT-1, avg rate 135, successful termination with ATPx1 10/11 clarification of Amiodrone dose F/U scheduled 10/25 Route to triage LR"  Successful telephone encounter to patient to follow up on s/s VT treated with ATP on 10/14/21. Patient unaware of episode. Meds reviewed and compliance note. Confirmed patient appointment 10/23/21 at 10:30 with Dr. Ladona Ridgel. Shock plan reviewed. Patient does not drive. Assisted with manual transmission this am as patient felt she had another episode this morning. No new episodes noted since 10/14/21. Patient admits to weight gain and possible fluid retention as well as increased shortness of breath. She is encouraged to contact heart failure clinic for additional recommendations. Will route to Dr. Ladona Ridgel and Dr. Shirlee Latch for review.

## 2021-10-19 NOTE — Telephone Encounter (Signed)
Add on appointment with Dr Shirlee Latch available for 10/25 Pt aware via mychart

## 2021-10-19 NOTE — Telephone Encounter (Signed)
She has not had appointment here since 5/22.  Needs to be worked in with me or APP early next week. Have her get a BMET, BNP, and Mg prior to appointment to help decide on med changes.

## 2021-10-23 ENCOUNTER — Ambulatory Visit (INDEPENDENT_AMBULATORY_CARE_PROVIDER_SITE_OTHER): Payer: Medicare PPO | Admitting: *Deleted

## 2021-10-23 ENCOUNTER — Encounter: Payer: Self-pay | Admitting: Internal Medicine

## 2021-10-23 ENCOUNTER — Ambulatory Visit (INDEPENDENT_AMBULATORY_CARE_PROVIDER_SITE_OTHER): Payer: Medicare PPO | Admitting: Internal Medicine

## 2021-10-23 ENCOUNTER — Ambulatory Visit (HOSPITAL_COMMUNITY)
Admission: RE | Admit: 2021-10-23 | Discharge: 2021-10-23 | Disposition: A | Discharge status: Home / Self Care | Payer: Medicare PPO | Source: Ambulatory Visit | Attending: Cardiology | Admitting: Cardiology

## 2021-10-23 ENCOUNTER — Encounter (HOSPITAL_COMMUNITY): Payer: Self-pay | Admitting: Cardiology

## 2021-10-23 ENCOUNTER — Other Ambulatory Visit: Payer: Self-pay

## 2021-10-23 ENCOUNTER — Encounter (HOSPITAL_COMMUNITY): Payer: Self-pay

## 2021-10-23 VITALS — BP 94/60 | HR 62 | Wt 206.4 lb

## 2021-10-23 VITALS — BP 98/64 | HR 57 | Ht 66.0 in | Wt 201.0 lb

## 2021-10-23 DIAGNOSIS — E785 Hyperlipidemia, unspecified: Secondary | ICD-10-CM

## 2021-10-23 DIAGNOSIS — Z09 Encounter for follow-up examination after completed treatment for conditions other than malignant neoplasm: Secondary | ICD-10-CM | POA: Diagnosis not present

## 2021-10-23 DIAGNOSIS — Z7984 Long term (current) use of oral hypoglycemic drugs: Secondary | ICD-10-CM | POA: Diagnosis not present

## 2021-10-23 DIAGNOSIS — R0600 Dyspnea, unspecified: Secondary | ICD-10-CM | POA: Insufficient documentation

## 2021-10-23 DIAGNOSIS — I480 Paroxysmal atrial fibrillation: Secondary | ICD-10-CM

## 2021-10-23 DIAGNOSIS — I255 Ischemic cardiomyopathy: Secondary | ICD-10-CM

## 2021-10-23 DIAGNOSIS — Z7902 Long term (current) use of antithrombotics/antiplatelets: Secondary | ICD-10-CM | POA: Insufficient documentation

## 2021-10-23 DIAGNOSIS — F17200 Nicotine dependence, unspecified, uncomplicated: Secondary | ICD-10-CM | POA: Insufficient documentation

## 2021-10-23 DIAGNOSIS — Z7901 Long term (current) use of anticoagulants: Secondary | ICD-10-CM | POA: Diagnosis not present

## 2021-10-23 DIAGNOSIS — I2510 Atherosclerotic heart disease of native coronary artery without angina pectoris: Secondary | ICD-10-CM | POA: Diagnosis not present

## 2021-10-23 DIAGNOSIS — Z8673 Personal history of transient ischemic attack (TIA), and cerebral infarction without residual deficits: Secondary | ICD-10-CM | POA: Diagnosis not present

## 2021-10-23 DIAGNOSIS — I739 Peripheral vascular disease, unspecified: Secondary | ICD-10-CM

## 2021-10-23 DIAGNOSIS — I4819 Other persistent atrial fibrillation: Secondary | ICD-10-CM | POA: Insufficient documentation

## 2021-10-23 DIAGNOSIS — Z955 Presence of coronary angioplasty implant and graft: Secondary | ICD-10-CM | POA: Insufficient documentation

## 2021-10-23 DIAGNOSIS — Z79899 Other long term (current) drug therapy: Secondary | ICD-10-CM

## 2021-10-23 DIAGNOSIS — I110 Hypertensive heart disease with heart failure: Secondary | ICD-10-CM | POA: Diagnosis not present

## 2021-10-23 DIAGNOSIS — I63549 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: Secondary | ICD-10-CM | POA: Diagnosis not present

## 2021-10-23 DIAGNOSIS — R0602 Shortness of breath: Secondary | ICD-10-CM | POA: Diagnosis not present

## 2021-10-23 DIAGNOSIS — I5022 Chronic systolic (congestive) heart failure: Secondary | ICD-10-CM

## 2021-10-23 DIAGNOSIS — D6861 Antiphospholipid syndrome: Secondary | ICD-10-CM | POA: Insufficient documentation

## 2021-10-23 DIAGNOSIS — I472 Ventricular tachycardia, unspecified: Secondary | ICD-10-CM

## 2021-10-23 DIAGNOSIS — D6859 Other primary thrombophilia: Secondary | ICD-10-CM

## 2021-10-23 DIAGNOSIS — Z8249 Family history of ischemic heart disease and other diseases of the circulatory system: Secondary | ICD-10-CM | POA: Diagnosis not present

## 2021-10-23 DIAGNOSIS — Z5181 Encounter for therapeutic drug level monitoring: Secondary | ICD-10-CM

## 2021-10-23 DIAGNOSIS — I252 Old myocardial infarction: Secondary | ICD-10-CM | POA: Diagnosis not present

## 2021-10-23 DIAGNOSIS — I5042 Chronic combined systolic (congestive) and diastolic (congestive) heart failure: Secondary | ICD-10-CM

## 2021-10-23 DIAGNOSIS — I10 Essential (primary) hypertension: Secondary | ICD-10-CM

## 2021-10-23 LAB — COMPREHENSIVE METABOLIC PANEL
ALT: 34 U/L (ref 0–44)
AST: 35 U/L (ref 15–41)
Albumin: 3.7 g/dL (ref 3.5–5.0)
Alkaline Phosphatase: 114 U/L (ref 38–126)
Anion gap: 11 (ref 5–15)
BUN: 17 mg/dL (ref 6–20)
CO2: 29 mmol/L (ref 22–32)
Calcium: 8.6 mg/dL — ABNORMAL LOW (ref 8.9–10.3)
Chloride: 96 mmol/L — ABNORMAL LOW (ref 98–111)
Creatinine, Ser: 1.15 mg/dL — ABNORMAL HIGH (ref 0.44–1.00)
GFR, Estimated: 55 mL/min — ABNORMAL LOW (ref >60)
Glucose, Bld: 179 mg/dL — ABNORMAL HIGH (ref 70–99)
Potassium: 3.5 mmol/L (ref 3.5–5.1)
Sodium: 136 mmol/L (ref 135–145)
Total Bilirubin: 0.7 mg/dL (ref 0.3–1.2)
Total Protein: 7.4 g/dL (ref 6.5–8.1)

## 2021-10-23 LAB — POCT INR: INR: 3 (ref 2.0–3.0)

## 2021-10-23 LAB — TSH: TSH: 3.004 u[IU]/mL (ref 0.350–4.500)

## 2021-10-23 MED ORDER — AMIODARONE HCL 200 MG PO TABS: 200.0000 mg | ORAL_TABLET | Freq: Two times a day (BID) | ORAL | 3 refills | Status: DC

## 2021-10-23 MED ORDER — TORSEMIDE 20 MG PO TABS: 60.0000 mg | ORAL_TABLET | Freq: Every day | ORAL | 3 refills | Status: DC

## 2021-10-23 MED ORDER — POTASSIUM CHLORIDE CRYS ER 20 MEQ PO TBCR: EXTENDED_RELEASE_TABLET | ORAL | 3 refills | Status: DC

## 2021-10-23 MED ORDER — MEXILETINE HCL 150 MG PO CAPS: 300.0000 mg | ORAL_CAPSULE | Freq: Three times a day (TID) | ORAL | 3 refills | Status: DC

## 2021-10-23 MED ORDER — TORSEMIDE 20 MG PO TABS: 80.0000 mg | ORAL_TABLET | Freq: Every day | ORAL | 3 refills | Status: DC

## 2021-10-23 NOTE — Patient Instructions (Signed)
Medication Instructions:  Take Mexiletine 300 mg Three Times Daily  Take Amiodarone 200 mg Two Times Daily   *If you need a refill on your cardiac medications before your next appointment, please call your pharmacy*   Lab Work: NONE   If you have labs (blood work) drawn today and your tests are completely normal, you will receive your results only by: MyChart Message (if you have MyChart) OR A paper copy in the mail If you have any lab test that is abnormal or we need to change your treatment, we will call you to review the results.   Testing/Procedures: NONE    Follow-Up: At [facility], you and your health needs are our priority.  As part of our continuing mission to provide you with exceptional heart care, we have created designated Provider Care Teams.  These Care Teams include your primary Cardiologist (physician) and Advanced Practice Providers (APPs -  Physician Assistants and Nurse Practitioners) who all work together to provide you with the care you need, when you need it.  We recommend signing up for the patient portal called "MyChart".  Sign up information is provided on this After Visit Summary.  MyChart is used to connect with patients for Virtual Visits (Telemedicine).  Patients are able to view lab/test results, encounter notes, upcoming appointments, etc.  Non-urgent messages can be sent to your provider as well.   To learn more about what you can do with MyChart, go to [email or web address].    Your next appointment:    January   The format for your next appointment:   In Person  Provider:   Lewayne Bunting, MD   Other Instructions Thank you for choosing  [facility]!

## 2021-10-23 NOTE — Patient Instructions (Signed)
Description    Continue taking warfarin 1 tablet daily except 1/2 tablet on Sundays, Tuesdays and Thursdays  Recheck in 3 wks

## 2021-10-23 NOTE — Progress Notes (Signed)
HPI Ms. Langenderfer returns today for followup of VT and chronic systolic heart failure. She is a pleasant 60 yo woman with the above problems and an elevated DFT. She had a VT ablation and has continued to have VT but with successful ATP. She has not had syncope. She has continued to have VT with eventual successful ATP. No edema. She has class 2 CHF symptoms. She has problems with anxiety.  Allergies  Allergen Reactions   Beef-Derived Products Anaphylaxis    From tick bite   Metoclopramide Hcl Anaphylaxis    Non responsive   Penicillins Anaphylaxis    Pt states she can take any cephalosporin (per pt. 01/01/19) Shock Has patient had a PCN reaction causing immediate rash, facial/tongue/throat swelling, SOB or lightheadedness with hypotension: Yes Has patient had a PCN reaction causing severe rash involving mucus membranes or skin necrosis: No Has patient had a PCN reaction that required hospitalization Yes Has patient had a PCN reaction occurring within the last 10 years: No If all of the above answers are "NO", then may proceed with Cephalosporin    Pork-Derived Products Anaphylaxis    From tick bite. Has tolerated heparin/lovenox   Metoprolol Other (See Comments)    Syncope    Reglan [Metoclopramide]    Statins Other (See Comments)    Myalgias     Current Outpatient Medications  Medication Sig Dispense Refill   ALPRAZolam (XANAX) 0.25 MG tablet Take 1 tablet (0.25 mg total) by mouth daily as needed for anxiety. 15 tablet 2   amiodarone (PACERONE) 200 MG tablet Take 1 tablet (200 mg total) by mouth 2 (two) times daily. Take 400 mg (2 tablets) Monday thru Friday, On Saturday and Sunday take 200 mg (1 tablet) 60 tablet 1   carvedilol (COREG) 12.5 MG tablet Take 12.5 mg by mouth 2 (two) times daily with a meal.     clopidogrel (PLAVIX) 75 MG tablet Take 1 tablet (75 mg total) by mouth daily with breakfast. 30 tablet 11   DULoxetine (CYMBALTA) 60 MG capsule Take 60 mg by mouth daily.      empagliflozin (JARDIANCE) 10 MG TABS tablet Take 1 tablet (10 mg total) by mouth daily before breakfast. 30 tablet 3   EPINEPHrine 0.3 mg/0.3 mL IJ SOAJ injection Inject 0.3 mg into the muscle as needed.     ezetimibe (ZETIA) 10 MG tablet TAKE 1 TABLET BY MOUTH EVERY DAY 30 tablet 5   isosorbide mononitrate (IMDUR) 30 MG 24 hr tablet Take 1 tablet (30 mg total) by mouth daily. 90 tablet 3   methocarbamol (ROBAXIN) 750 MG tablet Take 1 tablet (750 mg total) by mouth every 8 (eight) hours as needed for muscle spasms. 45 tablet 0   mexiletine (MEXITIL) 200 MG capsule TAKE 1 TABLET BY MOUTH EVERY 8 HOURS 90 capsule 3   Multiple Vitamins-Minerals (MULTIVITAMINS THER. W/MINERALS) TABS Take 1 tablet by mouth daily.       nitroGLYCERIN (NITROSTAT) 0.4 MG SL tablet PLACE 1 TABLET UNDER THE TONGUE EVERY 5 MINUTES FOR 3 DOSES AS NEEDED CHEST PAIN 25 tablet 3   oxyCODONE-acetaminophen (PERCOCET) 10-325 MG tablet Take 1 tablet by mouth every 4 (four) hours as needed for pain.     pantoprazole (PROTONIX) 40 MG tablet Take 1 tablet (40 mg total) by mouth daily. 30 tablet 11   potassium chloride SA (KLOR-CON) 20 MEQ tablet Take 2 tablets (40 mEq total) by mouth daily. 180 tablet 1   sacubitril-valsartan (ENTRESTO) 24-26 MG  Take 1 tablet by mouth 2 (two) times daily. 180 tablet 3   sennosides-docusate sodium (SENOKOT-S) 8.6-50 MG tablet Take 1 tablet by mouth daily.     spironolactone (ALDACTONE) 25 MG tablet TAKE 1 TABLET BY MOUTH EVERY DAY 30 tablet 11   torsemide (DEMADEX) 20 MG tablet Take 3 tablets (60 mg total) by mouth daily. Take 1 extra tablet as needed 120 tablet 6   traZODone (DESYREL) 100 MG tablet Take 100 mg by mouth at bedtime as needed for sleep.     warfarin (COUMADIN) 5 MG tablet Take 1 tablet (5 mg total) by mouth daily at 4 PM. 30 tablet 6   No current facility-administered medications for this visit.     Past Medical History:  Diagnosis Date   Aneurysm of internal carotid artery     Anti-phospholipid syndrome (HCC)    Anticardiolipin antibody positive    Chronic Coumadin   Brain aneurysm    followed by Dr. Corliss Skains   Chronic combined systolic (congestive) and diastolic (congestive) heart failure (HCC)    Cocaine abuse in remission [facility])    Coronary atherosclerosis of native coronary artery    Previous invasive cardiac testing was done at a facility in [place], [place].  Occluded diagonal, Diffuse LAD disease, Occluded Cx, and non obstructive RCA.    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   Past Surgical History:  Procedure Laterality Date   ABDOMINAL AORTAGRAM N/A 05/25/2014   Procedure: ABDOMINAL AORTAGRAM;  Surgeon: Iran Ouch, MD;  Location: [facility] CATH LAB;  Service: Cardiovascular;  Laterality: N/A;   APPENDECTOMY     BACK SURGERY  2010   Dr. Dutch Quint L1 cage, 5 disc fusion   CARDIAC DEFIBRILLATOR PLACEMENT  2008   [employer] ICD   CARDIOVERSION N/A 12/01/2020   Procedure: CARDIOVERSION;  Surgeon: [name], MD;  Location: [facility] ENDOSCOPY;  Service: Cardiovascular;  Laterality: N/A;   CORONARY STENT INTERVENTION N/A 01/08/2021   Procedure: CORONARY STENT INTERVENTION;  Surgeon: Swazil[place], Peter M, MD;  Location: [facility] INVASIVE CV LAB;  Service: Cardiovascular;  Laterality: N/A;   EP IMPLANTABLE DEVICE N/A 03/01/2016   Procedure: ICD Generator Changeout;  Surgeon: [name], MD;  Location: [facility] INVASIVE CV LAB;  Service: Cardiovascular;  Laterality: N/A;   ICD/BIV ICD FUNCTION(DFT) TEST N/A  03/14/2021   Procedure: ICD/BIV ICD FUNCTION (DFT) TEST;  Surgeon: [name], MD;  Location: [facility] INVASIVE CV LAB;  Service: Cardiovascular;  Laterality: N/A;   L1 corpectomy   11/29/2009   Laryngeal polyp excision     RIGHT/LEFT HEART CATH AND CORONARY ANGIOGRAPHY N/A 12/18/2020   Procedure: RIGHT/LEFT HEART CATH AND CORONARY ANGIOGRAPHY;  Surgeon: Dolores Patty, MD;  Location: [facility] INVASIVE CV LAB;  Service: Cardiovascular;  Laterality: N/A;   RIGHT/LEFT HEART CATH AND CORONARY ANGIOGRAPHY N/A 03/05/2021   Procedure: RIGHT/LEFT HEART CATH AND CORONARY ANGIOGRAPHY;  Surgeon: [name], MD;  Location: [facility] INVASIVE CV LAB;  Service: Cardiovascular;  Laterality: N/A;   TEE WITHOUT CARDIOVERSION N/A 12/01/2020   Procedure: TRANSESOPHAGEAL ECHOCARDIOGRAM (TEE);  Surgeon: [name], MD;  Location: [facility] ENDOSCOPY;  Service: Cardiovascular;  Laterality: N/AFloyce Stakes ABLATION N/A 03/08/2021   Procedure: Floyce Stakes ABLATION;  Surgeon: Lanier Prude, MD;  Location: [facility] INVASIVE CV LAB;  Service: Cardiovascular;  Laterality: N/A;     Family History  Problem Relation Age of Onset   Uterine cancer Mother    Heart disease Father 73   Hyperlipidemia Father    Hypertension Father    Ankylosing spondylitis Sister    Heart disease Sister    Hypertension Sister    Stomach cancer Brother 38   Heart attack Brother      Social History   Socioeconomic History   Marital status: Single    Spouse name: Not on file   Number of children: Not on file   Years of education: Not on file   Highest education level: Not on file  Occupational History   Occupation: Disabled    Comment: ESL  Tobacco Use   Smoking status: Former    Packs/day: 0.25    Years: 43.00    Pack years: 10.75    Types: Cigarettes, E-cigarettes    Start date: 03/03/1978    Quit date: 07/21/2021    Years since quitting: 0.2   Smokeless tobacco: Never   Tobacco comments:    4 ciggs per day  Vaping Use   Vaping  Use: Every day  Substance and Sexual Activity   Alcohol use: No    Alcohol/week: 0.0 standard drinks   Drug use: No    Comment: Prior history of cocaine   Sexual activity: Not Currently    Birth control/protection: None  Other Topics Concern   Not on file  Social History Narrative   Not on file   Social Determinants of Health   Financial Resource Strain: Low Risk    Difficulty of Paying Living Expenses: Not very hard  Food Insecurity: No Food Insecurity   Worried About Programme researcher, broadcasting/film/video in the Last Year: Never true   Ran Out of Food in the Last Year: Never true  Transportation Needs: No Transportation Needs   Lack of Transportation (Medical): No   Lack of Transportation (Non-Medical): No  Physical Activity: Not on file  Stress: Not on file  Social Connections: Not on file  Intimate Partner Violence: Not on file     BP 98/64   Pulse (!) 57   Ht 5\' 6"  (1.676 m)   Wt 201 lb (91.2 kg)   BMI 32.44 kg/m   Physical Exam:  Well appearing but overweight 60 yo woman, NAD HEENT: Unremarkable Neck:  No JVD, no thyromegally Lymphatics:  No adenopathy Back:  No CVA tenderness Lungs:  Clear with no wheezes HEART:  Regular rate rhythm, no murmurs, no rubs, no clicks Abd:  soft, positive bowel sounds, no organomegally, no rebound, no guarding Ext:  2 plus pulses, no edema, no cyanosis, no clubbing Skin:  No rashes no nodules Neuro:  CN II through XII intact, motor grossly intact  EKG - nsr  DEVICE  Normal device function.  See PaceArt for details.   Assess/Plan:  1. VT - She has had several eventually successful ATP's. I have asked her to continue her dose of amio to 400 mg daily. Continue mexitil but will increase to 300 mg 3 times a day. We discussed repeat VT ablation. One of the problems before  was that she had unstable VT. Now that her VT is slower she has tolerated it well. I would expect it to improve further with more mexitil. We discussed repeat ablation. I will see  her  back in 3 months and plan to consider repeat ablation if she is still having recurrent VT. 2. Chronic systolic heart failure - her symptoms are class 2. We will follow. 3. ICD - her [place] device is working normally. We will recheck in several months. 4. dyslipidemia - she will continue her statin therapy.    Sharlot Gowda Arnell Mausolf,MD

## 2021-10-23 NOTE — Patient Instructions (Addendum)
Labs done today. We will contact you only if your labs are abnormal.  STOP taking Plavix  INCREASE Torsemide to 80mg  (4 tablets) by mouth daily.   INCREASE Potassium to 16meq(2 tablets) by mouth every morning and 41meq(1 tablet) by mouth every evening.   No other medication changes were made. Please continue all current medications as prescribed.  You have been referred to the Lipid Clinic. They will contact you to schedule an appointment.   Your physician has requested that you have an ankle brachial index (ABI). During this test an ultrasound and blood pressure cuff are used to evaluate the arteries that supply the arms and legs with blood. Allow thirty minutes for this exam. There are no restrictions or special instructions. This has to be approved through your insurance prior to scheduling, once approved we will contact you to schedule an appointment.    Your physician recommends that you schedule a follow-up appointment in: 10 days for a lab only appointment, 3 weeks with our NP/PA Clinic and in 3 months with Dr. 30m with an echo prior to your exam.  If you have any questions or concerns before your next appointment please send Shirlee Latch a message through [place] or call our office at [phone].    TO LEAVE A MESSAGE FOR THE NURSE SELECT OPTION 2, PLEASE LEAVE A MESSAGE INCLUDING: YOUR NAME DATE OF BIRTH CALL BACK NUMBER REASON FOR CALL**this is important as we prioritize the call backs  YOU WILL RECEIVE A CALL BACK THE SAME DAY AS LONG AS YOU CALL BEFORE 4:00 PM   Do the following things EVERYDAY: Weigh yourself in the morning before breakfast. Write it down and keep it in a log. Take your medicines as prescribed Eat low salt foods--Limit salt (sodium) to 2000 mg per day.  Stay as active as you can everyday Limit all fluids for the day to less than 2 liters   At the [facility], you and your health needs are our priority. As part of our continuing mission to  provide you with exceptional heart care, we have created designated Provider Care Teams. These Care Teams include your primary Cardiologist (physician) and Advanced Practice Providers (APPs- Physician Assistants and Nurse Practitioners) who all work together to provide you with the care you need, when you need it.   You may see any of the following providers on your designated Care Team at your next follow up: Dr [phone] Dr [name], NP Carron Curie, Robbie Lis [place], PharmD   Please be sure to bring in all your medications bottles to every appointment.

## 2021-10-24 NOTE — Progress Notes (Signed)
PCP: [name], FNP Cardiology: Dr. Diona Browner HF Cardiology: Dr. Shirlee Latch  60 y.o. with history of CVA, anti-phospholipid antibody syndrome, CAD, ischemic cardiomyopathy, and persistent atrial fibrillation was referred by Dr. Diona Browner for evaluation of CHF. Patient had a cath in 2008 in setting of an MI in [place].  Diagonal and LCx were totally occluded, there was diffuse LAD disease.  She has not had a cath since that time. She has a Environmental manager ICD.  Echo in 8/21 showed EF 15-20%, anteroseptal akinesis, normal RV, severe LAE, mild MR. She was admitted in 8/21 with CHF exacerbation for 6 days.  She went into atrial fibrillation in 11/21.   In 12/21, she had TEE-guided DCCV back to NSR.  TEE showed EF 25-30%, mildly decreased RV systolic function, moderate MR.    Later in 12/21, she was admitted with syncope and VT with ICD shock.  RHC/LHC showed chronically occluded D1 and 95% proximal LCx; normal filling pressures with CI 3.0. Platelets were low, no intervention was done. Amiodarone was started.   In 1/22, she was admitted again with VT and presyncope, HS-TnI was negative.  She had PCI to LCx this admission.  She was started on mexiletine in addition to amiodarone.   She was readmitted later in 1/22 with chest pain, HS-TnI was negative and she was discharged.   She was admitted again in 3/22 with recurrent VT.  LHC/RHC showed totally occluded D1 (chronic) with patent LCx stent and filling pressures were normal with normal CO.  She had a VT ablation with Dr. Elberta Fortis.   She was admitted with VT and syncope in 6/22, troponin was negative.  She had VT treated with ATP in 10/22, mexiletine was increased.   She returns for followup of CHF, VT, and CAD.  Weight is up about 8 lbs.  She reports increased dyspnea for the last few weeks.  She gets short of breath walking up stairs/inclines and with activity such as washing dishes or walking longer distances on flat ground.  She has occasional aching in  her calves though not clearly exertional.  No chest pain. No orthopnea/PND.  She still smokes about 1 pack/week.   Labs (8/21): K 4.4, creatinine 0.58, LDL 94  Labs (12/21): LDL 72, HDL 31, TGs 85, K 4.4, creatinine 0.8, hgb 14.6, plts 70K Labs (1/22): K 3.3, creatinine 0.94, plts 58K Labs (3/22): K 3.8, creatinine 0.99, hgb 11.9, plts 160 Labs (4/22): hgb 14.2, plts 152, digoxin 0.2, K 4.1, creatinine 1.0 Labs (10/22): LDL 104, AST 74, ALT 76, plts 114, K 4.3, creatinine 1.11  PMH: 1. Brain aneurysm: Followed by Dr. Corliss Skains.  2. HTN 3. CVA: Total occlusion of RICA.  - Carotid dopplers (12/21): Occluded RICA, < 50% stenosis LICA.  - Carotid dopplers (3/22): Occluded RICA, 1-39% LICA 4. Anti-phospholipid antibody syndrome: Diagnosed at [facility].  - On warfarin and ASA 81 given history of CVA.  5. Remote cocaine abuse.  6. PAD: peripheral arterial dopplers in 3/21 with early left-sided iliac disease, right anterior tibial disease; ABI 1.0 on right and 0.75 on left.  7. Active smoker.  8. L-spine arthritis.  9. CAD: MI in 2008 in [facility], cath at the time showed totally occluded diagonal, totally occluded LCX, and diffuse LAD disease.  No intervention.   - LHC (12/21): chronically occluded D1, 45% mid LAD, 95% proximal LCx.  - PCI to proximal LCx in 1/22.  - LHC (3/22): CTO D1, patent LCx stent.  10. Atrial fibrillation: Persistent, started  in 11/21.  - TEE-DCCV back to NSR in 12/21.  11. Chronic systolic CHF: Ischemic cardiomyopathy.  [employer] ICD.  - Echo (8/21): EF 15-20%, anteroseptal AK, RV normal, severe LAE, mild MR.  - TEE (12/21): EF 25-30%, mildly decreased RV systolic function, moderate MR with ERO 0.26 cm^2 (probably functional).   - RHC (12/21): mean RA 1, PA 24/6, mean PCWP 12, CI 3.0 - RHC (3/22): mean RA 1, PA 19/4, mean PCWP 7, CI 2.69 12. Hyperlipidemia: Statin intolerant.  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   14. Mitral regurgitation: Moderate on  12/21 TEE.  15. Ventricular tachycardia: 3/22 VT ablation.   FH: Father, grandfather with MIs.  Brother with MI in his 27s.   SH: Lives in [place], on disability, smokes 4 cigs/day.  Prior cocaine.  No ETOH.   ROS: All systems reviewed and negative except as per HPI.   Current Outpatient Medications  Medication Sig Dispense Refill   ALPRAZolam (XANAX) 0.25 MG tablet Take 1 tablet (0.25 mg total) by mouth daily as needed for anxiety. 15 tablet 2   amiodarone (PACERONE) 200 MG tablet Take 1 tablet (200 mg total) by mouth 2 (two) times daily. 180 tablet 3   carvedilol (COREG) 12.5 MG tablet Take 12.5 mg by mouth 2 (two) times daily with a meal.     DULoxetine (CYMBALTA) 60 MG capsule Take 60 mg by mouth daily.     empagliflozin (JARDIANCE) 10 MG TABS tablet Take 1 tablet (10 mg total) by mouth daily before breakfast. 30 tablet 3   EPINEPHrine 0.3 mg/0.3 mL IJ SOAJ injection Inject 0.3 mg into the muscle as needed.     ezetimibe (ZETIA) 10 MG tablet TAKE 1 TABLET BY MOUTH EVERY DAY 30 tablet 5   isosorbide mononitrate (IMDUR) 30 MG 24 hr tablet Take 1 tablet (30 mg total) by mouth daily. 90 tablet 3   methocarbamol (ROBAXIN) 750 MG tablet Take 1 tablet (750 mg total) by mouth every 8 (eight) hours as needed for muscle spasms. 45 tablet 0   mexiletine (MEXITIL) 150 MG capsule Take 2 capsules (300 mg total) by mouth 3 (three) times daily. 540 capsule 3   Multiple Vitamins-Minerals (MULTIVITAMINS THER. W/MINERALS) TABS Take 1 tablet by mouth daily.       nitroGLYCERIN (NITROSTAT) 0.4 MG SL tablet PLACE 1 TABLET UNDER THE TONGUE EVERY 5 MINUTES FOR 3 DOSES AS NEEDED CHEST PAIN 25 tablet 3   oxyCODONE-acetaminophen (PERCOCET) 10-325 MG tablet Take 1 tablet by mouth every 4 (four) hours as needed for pain.     pantoprazole (PROTONIX) 40 MG tablet Take 1 tablet (40 mg total) by mouth daily. 30 tablet 11   sacubitril-valsartan (ENTRESTO) 24-26 MG Take 1 tablet by mouth 2 (two) times daily. 180 tablet 3    sennosides-docusate sodium (SENOKOT-S) 8.6-50 MG tablet Take 1 tablet by mouth daily.     spironolactone (ALDACTONE) 25 MG tablet TAKE 1 TABLET BY MOUTH EVERY DAY 30 tablet 11   traZODone (DESYREL) 100 MG tablet Take 100 mg by mouth at bedtime as needed for sleep.     warfarin (COUMADIN) 5 MG tablet Take 1 tablet (5 mg total) by mouth daily at 4 PM. 30 tablet 6   potassium chloride SA (KLOR-CON) 20 MEQ tablet Take 2 tablets (40 mEq total) by mouth every morning AND 1 tablet (20 mEq total) every evening. 270 tablet 3   torsemide (DEMADEX) 20 MG tablet Take 4 tablets (80 mg total) by mouth daily. Take 1  extra tablet as needed 360 tablet 3   No current facility-administered medications for this encounter.   BP 94/60   Pulse 62   Wt 93.6 kg (206 lb 6.4 oz)   SpO2 94%   BMI 33.31 kg/m  General: NAD Neck: No JVD, no thyromegaly or thyroid nodule.  Lungs: Clear to auscultation bilaterally with normal respiratory effort. CV: Nondisplaced PMI.  Heart regular S1/S2, no S3/S4, no murmur.  No peripheral edema.  No carotid bruit.  Normal pedal pulses.  Abdomen: Soft, nontender, no hepatosplenomegaly, no distention.  Skin: Intact without lesions or rashes.  Neurologic: Alert and oriented x 3.  Psych: Normal affect. Extremities: No clubbing or cyanosis.  HEENT: Normal.   Assessment/Plan: 1. CAD: MI in 2008 in [facility], cath at the time showed totally occluded diagonal, totally occluded LCX, and diffuse LAD disease.  No intervention. LHC was done in 12/21 due to VT, this showed chronically occluded D1 and 95% proximal LCx.  Initially, no PCI due to thrombocytopenia.  She eventually had PCI to proximal LCx in 1/22.  Repeat cath in 3/22 showed stable CTO D1 and patent LCx stent.  - Continue Zetia, has not tolerated statins.  LDL in 10/22 above goal, refer to lipid clinic for Repatha.  - >6 months on Plavix post-PCI, can stop at this point. She will continue warfarin 2. Atrial fibrillation: TEE-guided DCCV to  NSR in 12/21.  She is in NSR today.   - If atrial fibrillation recurs, will need to consider Tikosyn versus ablation.  - Continue warfarin.   3. Carotid stenosis: Known RICA occlusion.   - Repeat dopplers in 3/23.  4. 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  - She is on warfarin.  5. Chronic systolic CHF: Ischemic cardiomyopathy.  [employer] ICD.  Echo (8/21) with EF 15-20%, anteroseptal AK, RV normal, severe LAE, mild MR.  TEE (12/21) with EF 25-30%.  RHC in 12/21 and again in 3/22 with preserved cardiac output and optimized filling pressures.  Weight is up and she is volume overloaded on exam.  NYHA class II-III symptoms.  - Continue Entresto 24/26 bid, no BP room to increase.  - Continue empagliflozin 10 mg daily.   - Continue spironolactone 25 mg daily.  - Continue Coreg 12.5 mg bid.   - Increase torsemide to 80 mg daily and KCl to 40 qam/20 qpm with BMET in 10 days.  - I will arrange for repeat echo at followup in 3 months.  6. Smoking: Active smoker, wants to stop on her own. She has been cutting back.  7. PAD: She has some symptoms of claudication, no pedal ulcerations.  - Needs to quit smoking.  - I will arrange for ABIs.  8. 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  Platelets lately have been back in normal range.  - Has seen hematology.  9. Mitral regurgitation: Moderate on 12/21 TEE, suspect functional.  - Repeat echo at followup appt in 3 months.  10. VT: Suspect scar-mediated in 12/21 with ICD discharge and syncope. She had recurrent VT in 3/22.  She had VT ablation in 3/22.  Recent recurrent VT in 10/22 with ATP.  She is now on amiodarone + mexiletine.   - Continue amiodarone at 200 mg bid per EP.  LFTs recently mildly elevated, repeat today.  Check TSH.  She will need a regular eye exam.   - Continue mexiletine.  - Continue Coreg.   - She has followup with Dr. Ladona Ridgel.   Followup in 3  weeks with APP.   [name] 10/24/2021

## 2021-10-26 ENCOUNTER — Other Ambulatory Visit (HOSPITAL_COMMUNITY): Payer: Self-pay | Admitting: Cardiology

## 2021-10-26 ENCOUNTER — Encounter: Payer: Medicare PPO | Admitting: Internal Medicine

## 2021-10-26 DIAGNOSIS — I739 Peripheral vascular disease, unspecified: Secondary | ICD-10-CM

## 2021-11-02 ENCOUNTER — Encounter (HOSPITAL_COMMUNITY): Payer: Medicare PPO | Admitting: Cardiology

## 2021-11-05 ENCOUNTER — Ambulatory Visit (HOSPITAL_COMMUNITY)
Admission: RE | Admit: 2021-11-05 | Discharge: 2021-11-05 | Disposition: A | Discharge status: Home / Self Care | Payer: Medicare PPO | Source: Ambulatory Visit | Attending: Cardiology | Admitting: Cardiology

## 2021-11-05 ENCOUNTER — Other Ambulatory Visit: Payer: Self-pay

## 2021-11-05 DIAGNOSIS — I739 Peripheral vascular disease, unspecified: Secondary | ICD-10-CM | POA: Diagnosis not present

## 2021-11-08 ENCOUNTER — Ambulatory Visit: Payer: Medicare PPO | Admitting: Pharmacist

## 2021-11-08 NOTE — Progress Notes (Deleted)
Patient ID: ROBBI SPELLS                 DOB: 28-Mar-1961                    MRN: [id number]     HPI: [name] [name] is a 60 y.o. female patient referred to lipid clinic by Dr Shirlee Latch and PCP [name], FNP. 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 She has a history of statin intolerance and was referred to lipid clinic for further management.  Pt on [employer] medicare already - other branded med copays? Entresto Start repatha, HWF if needed INRs for lab draw instead of POC since antiphospholipid....Marland Kitchensend msg to lisa  Current Medications: ezetimibe 10mg  daily Intolerances: atorvastatin 10mg  every other day, rosuvastatin 10mg  2x a week Risk Factors: premature/progressive ASCVD (MI at age 85, stent placed 12/2020), PAD, CVA LDL goal: 55mg /dL  Diet:   Exercise:   Family History: Father, grandfather with MIs.  Brother with MI in his 60s.  Social History: Lives in [place], on disability, smokes 4 cigs/day.  Prior cocaine.  No ETOH.  Labs: 10/09/21: TC 173, TG 138, HDL 44, LDL 104 (ezetimibe 10mg  daily)  Past Medical History:  Diagnosis Date   Aneurysm of internal carotid artery    Anti-phospholipid syndrome (HCC)    Anticardiolipin antibody positive    Chronic Coumadin   Brain aneurysm    followed by Dr. 58s   Chronic combined systolic (congestive) and diastolic (congestive) heart failure (HCC)    Cocaine abuse in remission [facility])    Coronary atherosclerosis of native coronary artery    Previous invasive cardiac testing was done at a facility in [place], .  Occluded diagonal, Diffuse LAD disease, Occluded Cx, and non obstructive RCA.    Essential hypertension    Headache    History of  pneumonia    ICD (implantable cardioverter-defibrillator) in place    Ischemic cardiomyopathy    LVEF 30-35%   Mixed hyperlipidemia    NSVT (nonsustained ventricular tachycardia)    Persistent atrial fibrillation (HCC)    PVD (peripheral vascular disease) (HCC)    Raynaud's phenomenon    Statin intolerance    Stroke [facility]) 2007   Total occlusion of the right internal carotid    Current Outpatient Medications on File Prior to Visit  Medication Sig Dispense Refill   ALPRAZolam (XANAX) 0.25 MG tablet Take 1 tablet (0.25 mg total) by mouth daily as needed for anxiety. 15 tablet 2   amiodarone (PACERONE) 200 MG tablet Take 1 tablet (200 mg total) by mouth 2 (two) times daily. 180 tablet 3   carvedilol (COREG) 12.5 MG tablet Take 12.5 mg by mouth 2 (two) times daily with a meal.     DULoxetine (CYMBALTA) 60 MG capsule Take 60 mg by mouth daily.     empagliflozin (JARDIANCE) 10 MG TABS tablet Take 1 tablet (10 mg total) by mouth daily before breakfast. 30 tablet 3   EPINEPHrine 0.3 mg/0.3 mL IJ SOAJ injection Inject 0.3 mg into the muscle as needed.     ezetimibe (ZETIA) 10 MG tablet TAKE 1 TABLET BY MOUTH EVERY DAY 30 tablet 5   isosorbide mononitrate (IMDUR) 30  MG 24 hr tablet Take 1 tablet (30 mg total) by mouth daily. 90 tablet 3   methocarbamol (ROBAXIN) 750 MG tablet Take 1 tablet (750 mg total) by mouth every 8 (eight) hours as needed for muscle spasms. 45 tablet 0   mexiletine (MEXITIL) 150 MG capsule Take 2 capsules (300 mg total) by mouth 3 (three) times daily. 540 capsule 3   Multiple Vitamins-Minerals (MULTIVITAMINS THER. W/MINERALS) TABS Take 1 tablet by mouth daily.       nitroGLYCERIN (NITROSTAT) 0.4 MG SL tablet PLACE 1 TABLET UNDER THE TONGUE EVERY 5 MINUTES FOR 3 DOSES AS NEEDED CHEST PAIN 25 tablet 3   oxyCODONE-acetaminophen (PERCOCET) 10-325 MG tablet Take 1 tablet by mouth every 4 (four) hours as needed for pain.     pantoprazole (PROTONIX) 40 MG tablet Take 1 tablet (40 mg  total) by mouth daily. 30 tablet 11   potassium chloride SA (KLOR-CON) 20 MEQ tablet Take 2 tablets (40 mEq total) by mouth every morning AND 1 tablet (20 mEq total) every evening. 270 tablet 3   sacubitril-valsartan (ENTRESTO) 24-26 MG Take 1 tablet by mouth 2 (two) times daily. 180 tablet 3   sennosides-docusate sodium (SENOKOT-S) 8.6-50 MG tablet Take 1 tablet by mouth daily.     spironolactone (ALDACTONE) 25 MG tablet TAKE 1 TABLET BY MOUTH EVERY DAY 30 tablet 11   torsemide (DEMADEX) 20 MG tablet Take 4 tablets (80 mg total) by mouth daily. Take 1 extra tablet as needed 360 tablet 3   traZODone (DESYREL) 100 MG tablet Take 100 mg by mouth at bedtime as needed for sleep.     warfarin (COUMADIN) 5 MG tablet Take 1 tablet (5 mg total) by mouth daily at 4 PM. 30 tablet 6   No current facility-administered medications on file prior to visit.    Allergies  Allergen Reactions   Beef-Derived Products Anaphylaxis    From tick bite   Metoclopramide Hcl Anaphylaxis    Non responsive   Penicillins Anaphylaxis    Pt states she can take any cephalosporin (per pt. 01/01/19) Shock Has patient had a PCN reaction causing immediate rash, facial/tongue/throat swelling, SOB or lightheadedness with hypotension: Yes Has patient had a PCN reaction causing severe rash involving mucus membranes or skin necrosis: No Has patient had a PCN reaction that required hospitalization Yes Has patient had a PCN reaction occurring within the last 10 years: No If all of the above answers are "NO", then may proceed with Cephalosporin    Pork-Derived Products Anaphylaxis    From tick bite. Has tolerated heparin/lovenox   Metoprolol Other (See Comments)    Syncope    Reglan [Metoclopramide]    Statins Other (See Comments)    Myalgias    Assessment/Plan:  1. Hyperlipidemia -

## 2021-11-12 NOTE — Progress Notes (Signed)
PCP: Loman Brooklyn, FNP Cardiology: Dr. Domenic Polite HF Cardiology: Dr. Aundra Dubin  60 y.o. with history of CVA, anti-phospholipid antibody syndrome, CAD, ischemic cardiomyopathy, and persistent atrial fibrillation was referred by Dr. Domenic Polite for evaluation of CHF. Patient had a cath in 2008 in setting of an MI in [place].  Diagonal and LCx were totally occluded, there was diffuse LAD disease.  She has not had a cath since that time. She has a [place].  Echo in 8/21 showed EF 15-20%, anteroseptal akinesis, normal RV, severe LAE, mild MR. She was admitted in 8/21 with CHF exacerbation for 6 days.  She went into atrial fibrillation in 11/21.   In 12/21, she had TEE-guided DCCV back to NSR.  TEE showed EF 25-30%, mildly decreased RV systolic function, moderate MR.    Later in 12/21, she was admitted with syncope and VT with ICD shock.  RHC/LHC showed chronically occluded D1 and 95% proximal LCx; normal filling pressures with CI 3.0. Platelets were low, no intervention was done. Amiodarone was started.   In 1/22, she was admitted again with VT and presyncope, HS-TnI was negative.  She had PCI to LCx this admission.  She was started on mexiletine in addition to amiodarone.   She was readmitted later in 1/22 with chest pain, HS-TnI was negative and she was discharged.   She was admitted again in 3/22 with recurrent VT.  LHC/RHC showed totally occluded D1 (chronic) with patent LCx stent and filling pressures were normal with normal CO.  She had a VT ablation with Dr. Curt Bears.   She was admitted with VT and syncope in 6/22, troponin was negative.  She had VT treated with ATP in 10/22, mexiletine was increased.   Today she returns for HF follow up. She was volume overloaded last visit and torsemide was increased to 80 mg daily. She remains SOB with walking longer distances on flat surfaces. Main complaint is right knee OA. Denies CP, dizziness, or PND/Orthopnea. Appetite ok. No fever or chills. Weight at  home 195-196 pounds, down 5 lbs on home scale after increase in diuretic, but then went back up. Taking all medications. Still smokes about 1 pack per week. Drinking >3L fluid/day. Ate at [employer] today.  ICD interrogation (personally reviewed): unable to interrogate device today due to network being down.  ReDs: 24%  Labs (8/21): K 4.4, creatinine 0.58, LDL 94  Labs (12/21): LDL 72, HDL 31, TGs 85, K 4.4, creatinine 0.8, hgb 14.6, plts 70K Labs (1/22): K 3.3, creatinine 0.94, plts 58K Labs (3/22): K 3.8, creatinine 0.99, hgb 11.9, plts 160 Labs (4/22): hgb 14.2, plts 152, digoxin 0.2, K 4.1, creatinine 1.0 Labs (10/22): LDL 104, AST 74, ALT 76, plts 114, K 4.3, creatinine 1.11 Labs (10/22): K 3.5, creatinine 1.15, AST 35, ALT 34, TSH 3  PMH: 1. Brain aneurysm: Followed by Dr. Estanislado Pandy.  2. HTN 3. CVA: Total occlusion of RICA.  - Carotid dopplers (12/21): Occluded RICA, < [id number] stenosis LICA.  - Carotid dopplers (3/22): Occluded RICA, 123456 LICA 4. Anti-phospholipid antibody syndrome: Diagnosed at [facility].  - On warfarin and ASA 81 given history of CVA.  5. Remote cocaine abuse.  6. PAD: peripheral arterial dopplers in 3/21 with early left-sided iliac disease, right anterior tibial disease; ABI 1.0 on right and 0.75 on left.  7. Active smoker.  8. L-spine arthritis.  9. CAD: MI in 2008 in [facility], cath at the time showed totally occluded diagonal, totally occluded LCX, and diffuse LAD  disease.  No intervention.   - LHC (12/21): chronically occluded D1, 45% mid LAD, 95% proximal LCx.  - PCI to proximal LCx in 1/22.  - LHC (3/22): CTO D1, patent LCx stent.  10. Atrial fibrillation: Persistent, started in 11/21.  - TEE-DCCV back to NSR in 12/21.  11. Chronic systolic CHF: Ischemic cardiomyopathy.  [employer] ICD.  - Echo (8/21): EF 15-20%, anteroseptal AK, RV normal, severe LAE, mild MR.  - TEE (12/21): EF 25-30%, mildly decreased RV systolic function, moderate MR with  ERO 0.26 cm^2 (probably functional).   - RHC (12/21): mean RA 1, PA 24/6, mean PCWP 12, CI 3.0 - RHC (3/22): mean RA 1, PA 19/4, mean PCWP 7, CI 2.69 12. Hyperlipidemia: Statin intolerant.  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   14. Mitral regurgitation: Moderate on 12/21 TEE.  15. Ventricular tachycardia: 3/22 VT ablation.   FH: Father, grandfather with MIs.  Brother with MI in his 11s.   SH: Lives in [place], on disability, smokes 4 cigs/day.  Prior cocaine.  No ETOH.   ROS: All systems reviewed and negative except as per HPI.   Current Outpatient Medications  Medication Sig Dispense Refill   ALPRAZolam (XANAX) 0.25 MG tablet Take 1 tablet (0.25 mg total) by mouth daily as needed for anxiety. 15 tablet 2   amiodarone (PACERONE) 200 MG tablet Take 1 tablet (200 mg total) by mouth 2 (two) times daily. 180 tablet 3   carvedilol (COREG) 12.5 MG tablet Take 12.5 mg by mouth 2 (two) times daily with a meal.     DULoxetine (CYMBALTA) 60 MG capsule Take 60 mg by mouth daily.     empagliflozin (JARDIANCE) 10 MG TABS tablet Take 1 tablet (10 mg total) by mouth daily before breakfast. 30 tablet 3   EPINEPHrine 0.3 mg/0.3 mL IJ SOAJ injection Inject 0.3 mg into the muscle as needed.     ezetimibe (ZETIA) 10 MG tablet TAKE 1 TABLET BY MOUTH EVERY DAY 30 tablet 5   isosorbide mononitrate (IMDUR) 30 MG 24 hr tablet Take 1 tablet (30 mg total) by mouth daily. 90 tablet 3   methocarbamol (ROBAXIN) 750 MG tablet Take 1 tablet (750 mg total) by mouth every 8 (eight) hours as needed for muscle spasms. 45 tablet 0   mexiletine (MEXITIL) 150 MG capsule Take 2 capsules (300 mg total) by mouth 3 (three) times daily. 540 capsule 3   Multiple Vitamins-Minerals (MULTIVITAMINS THER. W/MINERALS) TABS Take 1 tablet by mouth daily.       nitroGLYCERIN (NITROSTAT) 0.4 MG SL tablet PLACE 1 TABLET UNDER THE TONGUE EVERY 5 MINUTES FOR 3 DOSES AS NEEDED CHEST PAIN 25 tablet 3   oxyCODONE-acetaminophen (PERCOCET) 10-325  MG tablet Take 1 tablet by mouth every 4 (four) hours as needed for pain.     pantoprazole (PROTONIX) 40 MG tablet Take 1 tablet (40 mg total) by mouth daily. 30 tablet 11   potassium chloride SA (KLOR-CON) 20 MEQ tablet Take 2 tablets (40 mEq total) by mouth every morning AND 1 tablet (20 mEq total) every evening. 270 tablet 3   sacubitril-valsartan (ENTRESTO) 24-26 MG Take 1 tablet by mouth 2 (two) times daily. 180 tablet 3   sennosides-docusate sodium (SENOKOT-S) 8.6-50 MG tablet Take 1 tablet by mouth daily as needed.     spironolactone (ALDACTONE) 25 MG tablet TAKE 1 TABLET BY MOUTH EVERY DAY 30 tablet 11   torsemide (DEMADEX) 20 MG tablet Take 4 tablets (80 mg total) by mouth daily. Take  1 extra tablet as needed 360 tablet 3   traZODone (DESYREL) 100 MG tablet Take 100 mg by mouth at bedtime as needed for sleep.     warfarin (COUMADIN) 5 MG tablet Take 1 tablet (5 mg total) by mouth daily at 4 PM. (Patient taking differently: Take 5 mg by mouth daily at 4 PM. Patient takes 5 mg tablet on Sunday Tuesday Thursday and Saturdays and takes 1/2  tablet on Monday Wednesday and Friday.) 30 tablet 6   No current facility-administered medications for this encounter.   Wt Readings from Last 3 Encounters:  11/13/21 93.4 kg (206 lb)  10/23/21 93.6 kg (206 lb 6.4 oz)  10/23/21 91.2 kg (201 lb)   BP 112/68   Pulse 63   Wt 93.4 kg (206 lb)   SpO2 96%   BMI 33.25 kg/m   General:  NAD. No resp difficulty HEENT: Normal Neck: Supple. No JVD. Carotids 2+ bilat; no bruits. No lymphadenopathy or thryomegaly appreciated. Cor: PMI nondisplaced. Regular rate & rhythm. No rubs, gallops or murmurs. Lungs: Clear Abdomen: Soft, nontender, nondistended. No hepatosplenomegaly. No bruits or masses. Good bowel sounds. Extremities: No cyanosis, clubbing, rash, edema Neuro: Alert & oriented x 3, cranial nerves grossly intact. Moves all 4 extremities w/o difficulty. Affect pleasant.  Assessment/Plan: 1. CAD: MI  in 2008 in [facility], cath at the time showed totally occluded diagonal, totally occluded LCX, and diffuse LAD disease.  No intervention. LHC was done in 12/21 due to VT, this showed chronically occluded D1 and 95% proximal LCx.  Initially, no PCI due to thrombocytopenia.  She eventually had PCI to proximal LCx in 1/22.  Repeat cath in 3/22 showed stable CTO D1 and patent LCx stent.  - Continue Zetia, has not tolerated statins.  LDL in 10/22 above goal, she has been referred to lipid clinic for Repatha.  - Off Plavix, she will continue warfarin 2. Atrial fibrillation: TEE-guided DCCV to NSR in 12/21.  She is regular on exam today.   - If atrial fibrillation recurs, will need to consider Tikosyn versus ablation.  - Continue warfarin.   3. Carotid stenosis: Known RICA occlusion.   - Repeat dopplers in 3/23.  4. 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  - She is on warfarin. INR followed by Coumadin Clinic in [place]. 5. Chronic systolic CHF: Ischemic cardiomyopathy.  [employer] ICD.  Echo (8/21) with EF 15-20%, anteroseptal AK, RV normal, severe LAE, mild MR.  TEE (12/21) with EF 25-30%.  RHC in 12/21 and again in 3/22 with preserved cardiac output and optimized filling pressures.  Weight remains up, but she does not appear volume overloaded on exam, ReDs 24%.  Stable NYHA class II symptoms, I suspect some of her symptoms are due to deconditioning. We discussed referral to PT or Prep but she declined.  - Continue Entresto 24/26 bid, no BP room to increase.  - Continue empagliflozin 10 mg daily.   - Continue spironolactone 25 mg daily.  - Continue Coreg 12.5 mg bid.   - Continue torsemide 80 mg daily and KCl 40 qam/20 qpm. BMET today. - Repeat echo at followup in 2 months.  - Discussed limiting fluids to <2L/day. 6. Smoking: Active smoker, wants to stop on her own. She has been cutting back.  7. PAD: She has some symptoms of claudication, no pedal ulcerations.  - Needs to quit smoking.   - ABIs pending.  8. 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  Platelets lately have been back in normal  range.  - Has seen hematology.  9. Mitral regurgitation: Moderate on 12/21 TEE, suspect functional.  - Repeat echo at followup appt in 2 months.  10. VT: Suspect scar-mediated in 12/21 with ICD discharge and syncope. She had recurrent VT in 3/22.  She had VT ablation in 3/22.  Recent recurrent VT in 10/22 with ATP.  She is now on amiodarone + mexiletine.   - Continue amiodarone at 200 mg bid per EP.  LFTs & TSH ok 10/22.  She will need a regular eye exam.   - Continue mexiletine.  - Continue Coreg.   - She has followup with Dr. Lovena Le.   Follow up in 2 months with Dr. Aundra Dubin + echo as scheduled.  Diggins FNP 11/13/2021

## 2021-11-13 ENCOUNTER — Encounter: Payer: Medicare PPO | Admitting: Internal Medicine

## 2021-11-13 ENCOUNTER — Other Ambulatory Visit: Payer: Self-pay

## 2021-11-13 ENCOUNTER — Ambulatory Visit (INDEPENDENT_AMBULATORY_CARE_PROVIDER_SITE_OTHER): Payer: Medicare PPO | Admitting: *Deleted

## 2021-11-13 ENCOUNTER — Ambulatory Visit (HOSPITAL_COMMUNITY)
Admission: RE | Admit: 2021-11-13 | Discharge: 2021-11-13 | Disposition: A | Discharge status: Home / Self Care | Payer: Medicare PPO | Source: Ambulatory Visit | Attending: Family Medicine | Admitting: Family Medicine

## 2021-11-13 ENCOUNTER — Encounter (HOSPITAL_COMMUNITY): Payer: Self-pay

## 2021-11-13 VITALS — BP 112/68 | HR 63 | Wt 206.0 lb

## 2021-11-13 DIAGNOSIS — Z09 Encounter for follow-up examination after completed treatment for conditions other than malignant neoplasm: Secondary | ICD-10-CM | POA: Insufficient documentation

## 2021-11-13 DIAGNOSIS — Z7902 Long term (current) use of antithrombotics/antiplatelets: Secondary | ICD-10-CM | POA: Insufficient documentation

## 2021-11-13 DIAGNOSIS — Z8673 Personal history of transient ischemic attack (TIA), and cerebral infarction without residual deficits: Secondary | ICD-10-CM | POA: Diagnosis not present

## 2021-11-13 DIAGNOSIS — I110 Hypertensive heart disease with heart failure: Secondary | ICD-10-CM | POA: Insufficient documentation

## 2021-11-13 DIAGNOSIS — D696 Thrombocytopenia, unspecified: Secondary | ICD-10-CM | POA: Diagnosis not present

## 2021-11-13 DIAGNOSIS — I472 Ventricular tachycardia, unspecified: Secondary | ICD-10-CM

## 2021-11-13 DIAGNOSIS — I340 Nonrheumatic mitral (valve) insufficiency: Secondary | ICD-10-CM | POA: Diagnosis not present

## 2021-11-13 DIAGNOSIS — F17210 Nicotine dependence, cigarettes, uncomplicated: Secondary | ICD-10-CM | POA: Diagnosis not present

## 2021-11-13 DIAGNOSIS — I252 Old myocardial infarction: Secondary | ICD-10-CM | POA: Insufficient documentation

## 2021-11-13 DIAGNOSIS — I4819 Other persistent atrial fibrillation: Secondary | ICD-10-CM | POA: Insufficient documentation

## 2021-11-13 DIAGNOSIS — D6859 Other primary thrombophilia: Secondary | ICD-10-CM

## 2021-11-13 DIAGNOSIS — Z7984 Long term (current) use of oral hypoglycemic drugs: Secondary | ICD-10-CM | POA: Diagnosis not present

## 2021-11-13 DIAGNOSIS — I255 Ischemic cardiomyopathy: Secondary | ICD-10-CM | POA: Insufficient documentation

## 2021-11-13 DIAGNOSIS — I2510 Atherosclerotic heart disease of native coronary artery without angina pectoris: Secondary | ICD-10-CM

## 2021-11-13 DIAGNOSIS — Z7901 Long term (current) use of anticoagulants: Secondary | ICD-10-CM | POA: Insufficient documentation

## 2021-11-13 DIAGNOSIS — Z955 Presence of coronary angioplasty implant and graft: Secondary | ICD-10-CM | POA: Insufficient documentation

## 2021-11-13 DIAGNOSIS — Z79899 Other long term (current) drug therapy: Secondary | ICD-10-CM | POA: Diagnosis not present

## 2021-11-13 DIAGNOSIS — I739 Peripheral vascular disease, unspecified: Secondary | ICD-10-CM | POA: Diagnosis not present

## 2021-11-13 DIAGNOSIS — I6521 Occlusion and stenosis of right carotid artery: Secondary | ICD-10-CM | POA: Diagnosis not present

## 2021-11-13 DIAGNOSIS — Z5181 Encounter for therapeutic drug level monitoring: Secondary | ICD-10-CM

## 2021-11-13 DIAGNOSIS — I5042 Chronic combined systolic (congestive) and diastolic (congestive) heart failure: Secondary | ICD-10-CM | POA: Diagnosis not present

## 2021-11-13 DIAGNOSIS — I480 Paroxysmal atrial fibrillation: Secondary | ICD-10-CM

## 2021-11-13 DIAGNOSIS — R0602 Shortness of breath: Secondary | ICD-10-CM | POA: Diagnosis present

## 2021-11-13 DIAGNOSIS — D6861 Antiphospholipid syndrome: Secondary | ICD-10-CM | POA: Diagnosis not present

## 2021-11-13 DIAGNOSIS — Z720 Tobacco use: Secondary | ICD-10-CM

## 2021-11-13 DIAGNOSIS — I63549 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: Secondary | ICD-10-CM

## 2021-11-13 LAB — BASIC METABOLIC PANEL
Anion gap: 10 (ref 5–15)
BUN: 11 mg/dL (ref 6–20)
CO2: 26 mmol/L (ref 22–32)
Calcium: 8.2 mg/dL — ABNORMAL LOW (ref 8.9–10.3)
Chloride: 100 mmol/L (ref 98–111)
Creatinine, Ser: 1.03 mg/dL — ABNORMAL HIGH (ref 0.44–1.00)
GFR, Estimated: >60 mL/min (ref >60)
Glucose, Bld: 93 mg/dL (ref 70–99)
Potassium: 4 mmol/L (ref 3.5–5.1)
Sodium: 136 mmol/L (ref 135–145)

## 2021-11-13 LAB — POCT INR: INR: 3.2 — AB (ref 2.0–3.0)

## 2021-11-13 LAB — BRAIN NATRIURETIC PEPTIDE: B Natriuretic Peptide: 211.8 pg/mL — ABNORMAL HIGH (ref 0.0–100.0)

## 2021-11-13 NOTE — Patient Instructions (Signed)
It was great to see you today! No medication changes are needed at this time.  Labs today We will only contact you if something comes back abnormal or we need to make some changes. Otherwise no news is good news!   Keep your follow up as scheduled  Do the following things EVERYDAY: Weigh yourself in the morning before breakfast. Write it down and keep it in a log. Take your medicines as prescribed Eat low salt foods--Limit salt (sodium) to 2000 mg per day.  Stay as active as you can everyday Limit all fluids for the day to less than 2 liters  At the [facility], you and your health needs are our priority. As part of our continuing mission to provide you with exceptional heart care, we have created designated Provider Care Teams. These Care Teams include your primary Cardiologist (physician) and Advanced Practice Providers (APPs- Physician Assistants and Nurse Practitioners) who all work together to provide you with the care you need, when you need it.   You may see any of the following providers on your designated Care Team at your next follow up: Dr [name] Dr Carron Curie, NP Robbie Lis, [place] [facility] [address] [place], [place] Karle Plumber, PharmD   Please be sure to bring in all your medications bottles to every appointment.

## 2021-11-13 NOTE — Progress Notes (Signed)
ReDS Vest / Clip - 11/13/21 1400       ReDS Vest / Clip   Station Marker D    Ruler Value 33    ReDS Value Range Low volume    ReDS Actual Value 24

## 2021-11-13 NOTE — Patient Instructions (Signed)
Hold warfarin tonight then resume 1 tablet daily except 1/2 tablet on Sundays, Tuesdays and Thursdays  Recheck in 3 wks

## 2021-11-14 ENCOUNTER — Encounter: Payer: Self-pay | Admitting: Family Medicine

## 2021-11-15 ENCOUNTER — Encounter: Payer: Self-pay | Admitting: Family Medicine

## 2021-11-15 ENCOUNTER — Ambulatory Visit: Payer: Medicare PPO | Admitting: Family Medicine

## 2021-11-15 ENCOUNTER — Other Ambulatory Visit: Payer: Self-pay

## 2021-11-15 VITALS — BP 116/64 | HR 57 | Temp 95.6°F | Ht 66.0 in | Wt 203.0 lb

## 2021-11-15 DIAGNOSIS — N3001 Acute cystitis with hematuria: Secondary | ICD-10-CM

## 2021-11-15 DIAGNOSIS — R8290 Unspecified abnormal findings in urine: Secondary | ICD-10-CM | POA: Diagnosis not present

## 2021-11-15 DIAGNOSIS — F410 Panic disorder [episodic paroxysmal anxiety] without agoraphobia: Secondary | ICD-10-CM | POA: Diagnosis not present

## 2021-11-15 LAB — MICROSCOPIC EXAMINATION
Epithelial Cells (non renal): NONE SEEN /hpf (ref 0–10)
RBC, Urine: NONE SEEN /hpf (ref 0–2)
Renal Epithel, UA: NONE SEEN /hpf

## 2021-11-15 LAB — URINALYSIS, ROUTINE W REFLEX MICROSCOPIC
Bilirubin, UA: NEGATIVE
Ketones, UA: NEGATIVE
Nitrite, UA: NEGATIVE
Protein,UA: NEGATIVE
Specific Gravity, UA: 1.01 (ref 1.005–1.030)
Urobilinogen, Ur: 0.2 mg/dL (ref 0.2–1.0)
pH, UA: 5 (ref 5.0–7.5)

## 2021-11-15 MED ORDER — BUSPIRONE HCL 7.5 MG PO TABS
7.5000 mg | ORAL_TABLET | Freq: Two times a day (BID) | ORAL | 2 refills | Status: DC | PRN
Start: 2021-11-15 — End: 2022-01-28

## 2021-11-15 MED ORDER — NITROFURANTOIN MONOHYD MACRO 100 MG PO CAPS: 100.0000 mg | ORAL_CAPSULE | Freq: Two times a day (BID) | ORAL | 0 refills | Status: AC

## 2021-11-15 NOTE — Progress Notes (Signed)
Assessment & Plan:  1. Acute cystitis with hematuria Education provided on UTIs. Encouraged adequate hydration.  - nitrofurantoin, macrocrystal-monohydrate, (MACROBID) 100 MG capsule; Take 1 capsule (100 mg total) by mouth 2 (two) times daily for 5 days.  Dispense: 10 capsule; Refill: 0 - Urine Culture  2. Abnormal urine odor - Urinalysis, Routine w reflex microscopic - Urine dipstick shows positive for RBC's, positive for glucose, and positive for leukocytes.  Micro exam: 6-10 WBC's per HPF and moderate bacteria.  3. Anxiety attack Uncontrolled. Started BuSpar as needed, but discussed she can take twice daily everyday if she finds it helpful. Also discussed I cannot continue to prescribe Xanax due to her frequency of usage. She is going to get future prescriptions from her neurologist's office.  - busPIRone (BUSPAR) 7.5 MG tablet; Take 1 tablet (7.5 mg total) by mouth 2 (two) times daily as needed.  Dispense: 30 tablet; Refill: 2   Return as previously advised.  Hendricks Limes, MSN, APRN, FNP-C [facility]  Subjective:    Patient ID: [name] [name], female    DOB: [birth year], 60 y.o.   MRN: [id number]  Patient Care Team: Loman Brooklyn, FNP as PCP - General (Family Medicine) Satira Sark, MD as PCP - Cardiology (Cardiology) Evans Lance, MD as PCP - Electrophysiology (Cardiology) Phillips Odor, MD as Consulting Physician (Neurology) Larey Dresser, MD as Consulting Physician (Cardiology) Ladell Pier, MD as Consulting Physician (Oncology)   Chief Complaint:  Chief Complaint  Patient presents with   urinary odor     X2 weeks    Anxiety    Patient states she needs more than 15 xanax a month     HPI: [name] [name] is a 60 y.o. female presenting on 11/15/2021 for urinary odor  (X2 weeks ) and Anxiety (Patient states she needs more than 15 xanax a month )  Patient reports urinary odor, cloudy urine, and discomfort in her pelvic area.  She states this started after her pap smear 2 weeks ago. It is unclear how long this has been going on since her pap smear was over a month ago.  New complaints: Patient also wants to discuss her Xanax. At our last visit ~5 weeks ago she was given Xanax #15 with directions to try calming/deep breathing exercises before taking a Xanax. I explained I may continue to prescribe them depending on her usage, but that I would not continue if she had a high utilization as Xanax should not be a daily medication. She reports today that she needs a Xanax 1-2 times each day. She is currently taking Cymbalta 60 mg daily and reports her anxiety was not better controlled when she increased it to 90 mg daily, so she went back down to 60 mg daily. She has tried hydroxyzine, which was not helpful. Her anxiety increases dramatically when her heart feels different or her implantable ICD takes a reading. She is afraid of getting shocked again or of going into cardiac arrest again.    Social history:  Relevant past medical, surgical, family and social history reviewed and updated as indicated. Interim medical history since our last visit reviewed.  Allergies and medications reviewed and updated.  DATA REVIEWED: CHART IN EPIC  ROS: Negative unless specifically indicated above in HPI.    Current Outpatient Medications:    ALPRAZolam (XANAX) 0.25 MG tablet, Take 1 tablet (0.25 mg total) by mouth daily as needed for anxiety., Disp: 15 tablet, Rfl: 2  amiodarone (PACERONE) 200 MG tablet, Take 1 tablet (200 mg total) by mouth 2 (two) times daily., Disp: 180 tablet, Rfl: 3   carvedilol (COREG) 12.5 MG tablet, Take 12.5 mg by mouth 2 (two) times daily with a meal., Disp: , Rfl:    DULoxetine (CYMBALTA) 60 MG capsule, Take 60 mg by mouth daily., Disp: , Rfl:    empagliflozin (JARDIANCE) 10 MG TABS tablet, Take 1 tablet (10 mg total) by mouth daily before breakfast., Disp: 30 tablet, Rfl: 3   EPINEPHrine 0.3 mg/0.3 mL IJ  SOAJ injection, Inject 0.3 mg into the muscle as needed., Disp: , Rfl:    ezetimibe (ZETIA) 10 MG tablet, TAKE 1 TABLET BY MOUTH EVERY DAY, Disp: 30 tablet, Rfl: 5   isosorbide mononitrate (IMDUR) 30 MG 24 hr tablet, Take 1 tablet (30 mg total) by mouth daily., Disp: 90 tablet, Rfl: 3   methocarbamol (ROBAXIN) 750 MG tablet, Take 1 tablet (750 mg total) by mouth every 8 (eight) hours as needed for muscle spasms., Disp: 45 tablet, Rfl: 0   mexiletine (MEXITIL) 150 MG capsule, Take 2 capsules (300 mg total) by mouth 3 (three) times daily., Disp: 540 capsule, Rfl: 3   Multiple Vitamins-Minerals (MULTIVITAMINS THER. W/MINERALS) TABS, Take 1 tablet by mouth daily.  , Disp: , Rfl:    nitroGLYCERIN (NITROSTAT) 0.4 MG SL tablet, PLACE 1 TABLET UNDER THE TONGUE EVERY 5 MINUTES FOR 3 DOSES AS NEEDED CHEST PAIN, Disp: 25 tablet, Rfl: 3   oxyCODONE-acetaminophen (PERCOCET) 10-325 MG tablet, Take 1 tablet by mouth every 4 (four) hours as needed for pain., Disp: , Rfl:    pantoprazole (PROTONIX) 40 MG tablet, Take 1 tablet (40 mg total) by mouth daily., Disp: 30 tablet, Rfl: 11   potassium chloride SA (KLOR-CON) 20 MEQ tablet, Take 2 tablets (40 mEq total) by mouth every morning AND 1 tablet (20 mEq total) every evening., Disp: 270 tablet, Rfl: 3   sacubitril-valsartan (ENTRESTO) 24-26 MG, Take 1 tablet by mouth 2 (two) times daily., Disp: 180 tablet, Rfl: 3   sennosides-docusate sodium (SENOKOT-S) 8.6-50 MG tablet, Take 1 tablet by mouth daily as needed., Disp: , Rfl:    spironolactone (ALDACTONE) 25 MG tablet, TAKE 1 TABLET BY MOUTH EVERY DAY, Disp: 30 tablet, Rfl: 11   torsemide (DEMADEX) 20 MG tablet, Take 4 tablets (80 mg total) by mouth daily. Take 1 extra tablet as needed, Disp: 360 tablet, Rfl: 3   traZODone (DESYREL) 100 MG tablet, Take 100 mg by mouth at bedtime as needed for sleep., Disp: , Rfl:    warfarin (COUMADIN) 5 MG tablet, Take 1 tablet (5 mg total) by mouth daily at 4 PM. (Patient taking  differently: Take 5 mg by mouth daily at 4 PM. Patient takes 5 mg tablet on Sunday Tuesday Thursday and Saturdays and takes 1/2  tablet on Monday Wednesday and Friday.), Disp: 30 tablet, Rfl: 6   Allergies  Allergen Reactions   Beef-Derived Products Anaphylaxis    From tick bite   Metoclopramide Hcl Anaphylaxis    Non responsive   Penicillins Anaphylaxis    Pt states she can take any cephalosporin (per pt. 01/01/19) Shock Has patient had a PCN reaction causing immediate rash, facial/tongue/throat swelling, SOB or lightheadedness with hypotension: Yes Has patient had a PCN reaction causing severe rash involving mucus membranes or skin necrosis: No Has patient had a PCN reaction that required hospitalization Yes Has patient had a PCN reaction occurring within the last 10 years: No If all of  the above answers are "NO", then may proceed with Cephalosporin    Pork-Derived Products Anaphylaxis    From tick bite. Has tolerated heparin/lovenox   Metoprolol Other (See Comments)    Syncope    Reglan [Metoclopramide]    Statins Other (See Comments)    Myalgias   Past Medical History:  Diagnosis Date   Aneurysm of internal carotid artery    Anti-phospholipid syndrome (HCC)    Anticardiolipin antibody positive    Chronic Coumadin   Brain aneurysm    followed by Dr. Estanislado Pandy   Chronic combined systolic (congestive) and diastolic (congestive) heart failure (HCC)    Cocaine abuse in remission [facility])    Coronary atherosclerosis of native coronary artery    Previous invasive cardiac testing was done at a facility in [place], [place].  Occluded diagonal, Diffuse LAD disease, Occluded Cx, and non obstructive RCA.    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 Trenton Gammon L1 cage, 5 disc fusion   CARDIAC DEFIBRILLATOR PLACEMENT  2008   [employer] ICD   CARDIOVERSION N/A 12/01/2020   Procedure: CARDIOVERSION;  Surgeon: Larey Dresser, MD;  Location: [facility] ENDOSCOPY;  Service: Cardiovascular;  Laterality: N/A;   CORONARY STENT INTERVENTION N/A 01/08/2021   Procedure: CORONARY STENT INTERVENTION;  Surgeon: Martinique, Peter M, MD;  Location: [place] CV LAB;  Service: Cardiovascular;  Laterality: N/A;   EP IMPLANTABLE DEVICE N/A 03/01/2016   Procedure: ICD Generator Changeout;  Surgeon: Evans Lance, MD;  Location: [place] CV LAB;  Service: Cardiovascular;  Laterality: N/A;   ICD/BIV ICD FUNCTION(DFT) TEST N/A 03/14/2021   Procedure: ICD/BIV ICD FUNCTION (DFT) TEST;  Surgeon: Evans Lance, MD;  Location: [place] CV LAB;  Service: Cardiovascular;  Laterality: N/A;   L1 corpectomy   11/29/2009   Laryngeal polyp excision     RIGHT/LEFT HEART CATH AND CORONARY ANGIOGRAPHY N/A 12/18/2020   Procedure: RIGHT/LEFT HEART CATH AND CORONARY ANGIOGRAPHY;  Surgeon: Jolaine Artist, MD;  Location: [place] CV LAB;  Service: Cardiovascular;  Laterality: N/A;   RIGHT/LEFT HEART CATH AND CORONARY ANGIOGRAPHY N/A 03/05/2021   Procedure: RIGHT/LEFT HEART CATH AND CORONARY ANGIOGRAPHY;  Surgeon: Larey Dresser, MD;  Location: [place] CV LAB;  Service: Cardiovascular;  Laterality: N/A;   TEE WITHOUT CARDIOVERSION N/A 12/01/2020   Procedure: TRANSESOPHAGEAL ECHOCARDIOGRAM (TEE);  Surgeon: Larey Dresser, MD;  Location: [facility] ENDOSCOPY;   Service: Cardiovascular;  Laterality: N/AStephanie Coup ABLATION N/A 03/08/2021   Procedure: Stephanie Coup ABLATION;  Surgeon: Vickie Epley, MD;  Location: [place] CV LAB;  Service: Cardiovascular;  Laterality: N/A;    Social History   Socioeconomic History   Marital status: Single    Spouse name: Not on file   Number  of children: Not on file   Years of education: Not on file   Highest education level: Not on file  Occupational History   Occupation: Disabled    Comment: ESL  Tobacco Use   Smoking status: Former    Packs/day: 0.25    Years: 43.00    Pack years: 10.75    Types: Cigarettes, E-cigarettes    Start date: 03/03/1978    Quit date: 07/21/2021    Years since quitting: 0.3   Smokeless tobacco: Never   Tobacco comments:    4 ciggs per day  Vaping Use   Vaping Use: Every day  Substance and Sexual Activity   Alcohol use: No    Alcohol/week: 0.0 standard drinks   Drug use: No    Comment: Prior history of cocaine   Sexual activity: Not Currently    Birth control/protection: None  Other Topics Concern   Not on file  Social History Narrative   Not on file   Social Determinants of Health   Financial Resource Strain: Low Risk    Difficulty of Paying Living Expenses: Not very hard  Food Insecurity: No Food Insecurity   Worried About Charity fundraiser in the Last Year: Never true   [place] in the Last Year: Never true  Transportation Needs: No Transportation Needs   Lack of Transportation (Medical): No   Lack of Transportation (Non-Medical): No  Physical Activity: Not on file  Stress: Not on file  Social Connections: Not on file  Intimate Partner Violence: Not on file        Objective:    BP 116/64   Pulse (!) 57   Temp (!) 95.6 F (35.3 C) (Temporal)   Ht 5' 6"  (1.676 m)   Wt 203 lb (92.1 kg)   BMI 32.77 kg/m   Wt Readings from Last 3 Encounters:  11/15/21 203 lb (92.1 kg)  11/13/21 206 lb (93.4 kg)  10/23/21 206 lb 6.4 oz (93.6 kg)     Physical Exam Vitals reviewed.  Constitutional:      General: She is not in acute distress.    Appearance: Normal appearance. She is obese. She is not ill-appearing, toxic-appearing or diaphoretic.  HENT:     Head: Normocephalic and atraumatic.  Eyes:     General: No scleral icterus.       Right eye: No discharge.        Left eye: No discharge.     Conjunctiva/sclera: Conjunctivae normal.  Cardiovascular:     Rate and Rhythm: Normal rate and regular rhythm.     Heart sounds: Normal heart sounds. No murmur heard.   No friction rub. No gallop.  Pulmonary:     Effort: Pulmonary effort is normal. No respiratory distress.     Breath sounds: Normal breath sounds. No stridor. No wheezing, rhonchi or rales.  Musculoskeletal:        General: Normal range of motion.     Cervical back: Normal range of motion.  Skin:    General: Skin is warm and dry.     Capillary Refill: Capillary refill takes less than 2 seconds.  Neurological:     General: No focal deficit present.     Mental Status: She is alert and oriented to person, place, and time. Mental status is at baseline.  Psychiatric:        Mood and Affect: Mood normal.        Behavior: Behavior normal.  Thought Content: Thought content normal.        Judgment: Judgment normal.    Lab Results  Component Value Date   TSH 3.004 10/23/2021   Lab Results  Component Value Date   WBC 6.6 10/09/2021   HGB 14.1 10/09/2021   HCT 43.4 10/09/2021   MCV 90 10/09/2021   PLT 114 (L) 10/09/2021   Lab Results  Component Value Date   NA 136 11/13/2021   K 4.0 11/13/2021   CO2 26 11/13/2021   GLUCOSE 93 11/13/2021   BUN 11 11/13/2021   CREATININE 1.03 (H) 11/13/2021   BILITOT 0.7 10/23/2021   ALKPHOS 114 10/23/2021   AST 35 10/23/2021   ALT 34 10/23/2021   PROT 7.4 10/23/2021   ALBUMIN 3.7 10/23/2021   CALCIUM 8.2 (L) 11/13/2021   ANIONGAP 10 11/13/2021   EGFR 58 (L) 10/09/2021   Lab Results  Component Value Date    CHOL 173 10/09/2021   Lab Results  Component Value Date   HDL 44 10/09/2021   Lab Results  Component Value Date   LDLCALC 104 (H) 10/09/2021   Lab Results  Component Value Date   TRIG 138 10/09/2021   Lab Results  Component Value Date   CHOLHDL 3.9 10/09/2021   Lab Results  Component Value Date   HGBA1C 5.2 05/22/2021

## 2021-11-18 ENCOUNTER — Encounter: Payer: Self-pay | Admitting: Family Medicine

## 2021-11-20 ENCOUNTER — Other Ambulatory Visit: Payer: Self-pay | Admitting: Family Medicine

## 2021-11-20 DIAGNOSIS — Z79891 Long term (current) use of opiate analgesic: Secondary | ICD-10-CM | POA: Diagnosis not present

## 2021-11-20 DIAGNOSIS — G8912 Acute post-thoracotomy pain: Secondary | ICD-10-CM | POA: Diagnosis not present

## 2021-11-20 DIAGNOSIS — F419 Anxiety disorder, unspecified: Secondary | ICD-10-CM | POA: Diagnosis not present

## 2021-11-20 DIAGNOSIS — G8929 Other chronic pain: Secondary | ICD-10-CM | POA: Diagnosis not present

## 2021-11-20 DIAGNOSIS — M545 Low back pain, unspecified: Secondary | ICD-10-CM | POA: Diagnosis not present

## 2021-11-20 DIAGNOSIS — G4700 Insomnia, unspecified: Secondary | ICD-10-CM | POA: Diagnosis not present

## 2021-11-20 DIAGNOSIS — M25569 Pain in unspecified knee: Secondary | ICD-10-CM | POA: Diagnosis not present

## 2021-11-20 DIAGNOSIS — F4310 Post-traumatic stress disorder, unspecified: Secondary | ICD-10-CM

## 2021-11-20 LAB — URINE CULTURE

## 2021-11-20 NOTE — Telephone Encounter (Signed)
Refill denied  Needs appt for refills of controlled substance

## 2021-11-20 NOTE — Telephone Encounter (Signed)
Called patient to schedule an appt. She stated that she had a visit with her pain management doctor and they are going to be prescribing them for her so she will not need [place] to call them in.

## 2021-11-26 ENCOUNTER — Ambulatory Visit: Payer: Medicare PPO | Admitting: Cardiology

## 2021-11-26 NOTE — Progress Notes (Deleted)
Cardiology Office Note  Date: 11/26/2021   ID: [name] [name], DOB October 17, 1961, MRN [id number]  PCP:  [name], FNP  Cardiologist:  Nona Dell, MD Electrophysiologist:  Lewayne Bunting, MD   No chief complaint on file.   History of Present Illness: [name] [name] is a 59 y.o. female that I last saw in May.  I reviewed interval follow-up visits, just recently seen in the heart failure clinic.  She sees Dr. Shirlee Latch.  She has a Environmental manager ICD in place with follow-up by Dr. Ladona Ridgel.  She has also had recurrent VT.  She has undergone VT ablation and is also on amiodarone and mexiletine.  She is on Coumadin with follow-up in anticoagulation clinic, history of antiphospholipid antibody syndrome, previous stroke, also persistent atrial fibrillation.  Most recent cardiac catheterization in March revealed patent circumflex stent site with chronically occluded first diagonal, preserved cardiac output with low filling pressures.  She had undergone DES to the circumflex in January of this year.  Her last LVEF was 25 to 30% at TEE in December 2021, mildly reduced RV contraction at that time, and moderate mitral regurgitation.  Follow-up echocardiogram is pending.  Past Medical History:  Diagnosis Date   Aneurysm of internal carotid artery    Anti-phospholipid syndrome (HCC)    Anticardiolipin antibody positive    Chronic Coumadin   Brain aneurysm    followed by Dr. Corliss Skains   Chronic combined systolic (congestive) and diastolic (congestive) heart failure (HCC)    Cocaine abuse in remission [facility])    Coronary atherosclerosis of native coronary artery    Previous invasive cardiac testing was done at a facility in [place], [place].  Occluded diagonal, Diffuse LAD disease, Occluded Cx, and non obstructive RCA.    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 Dutch Quint L1 cage, 5 disc fusion   CARDIAC DEFIBRILLATOR PLACEMENT  2008   [employer] ICD   CARDIOVERSION N/A 12/01/2020   Procedure: CARDIOVERSION;  Surgeon: [name], MD;  Location: [facility] ENDOSCOPY;  Service: Cardiovascular;  Laterality: N/A;   CORONARY STENT INTERVENTION N/A 01/08/2021   Procedure: CORONARY STENT INTERVENTION;  Surgeon: Swaziland, Peter M, MD;  Location: [facility] INVASIVE CV LAB;  Service: Cardiovascular;  Laterality: N/A;   EP IMPLANTABLE DEVICE N/A 03/01/2016   Procedure: ICD Generator Changeout;  Surgeon: [name], MD;  Location: [facility] INVASIVE CV LAB;  Service: Cardiovascular;  Laterality: N/A;   ICD/BIV ICD FUNCTION(DFT) TEST N/A 03/14/2021   Procedure: ICD/BIV ICD FUNCTION (DFT) TEST;  Surgeon: [name], MD;  Location: [facility] INVASIVE CV LAB;  Service: Cardiovascular;  Laterality: N/A;   L1 corpectomy   11/29/2009   Laryngeal polyp excision     RIGHT/LEFT HEART CATH AND CORONARY ANGIOGRAPHY N/A  12/18/2020   Procedure: RIGHT/LEFT HEART CATH AND CORONARY ANGIOGRAPHY;  Surgeon: Jolaine Artist, MD;  Location: [place] CV LAB;  Service: Cardiovascular;  Laterality: N/A;   RIGHT/LEFT HEART CATH AND CORONARY ANGIOGRAPHY N/A 03/05/2021   Procedure: RIGHT/LEFT HEART CATH AND CORONARY ANGIOGRAPHY;  Surgeon: Larey Dresser, MD;  Location: [place] CV LAB;  Service: Cardiovascular;  Laterality: N/A;   TEE WITHOUT CARDIOVERSION N/A  12/01/2020   Procedure: TRANSESOPHAGEAL ECHOCARDIOGRAM (TEE);  Surgeon: Larey Dresser, MD;  Location: [facility] ENDOSCOPY;  Service: Cardiovascular;  Laterality: N/AStephanie Coup ABLATION N/A 03/08/2021   Procedure: Stephanie Coup ABLATION;  Surgeon: Vickie Epley, MD;  Location: [place] CV LAB;  Service: Cardiovascular;  Laterality: N/A;    Current Outpatient Medications  Medication Sig Dispense Refill   ALPRAZolam (XANAX) 0.25 MG tablet Take 1 tablet (0.25 mg total) by mouth daily as needed for anxiety. 15 tablet 2   amiodarone (PACERONE) 200 MG tablet Take 1 tablet (200 mg total) by mouth 2 (two) times daily. 180 tablet 3   busPIRone (BUSPAR) 7.5 MG tablet Take 1 tablet (7.5 mg total) by mouth 2 (two) times daily as needed. 30 tablet 2   carvedilol (COREG) 12.5 MG tablet Take 12.5 mg by mouth 2 (two) times daily with a meal.     DULoxetine (CYMBALTA) 60 MG capsule Take 60 mg by mouth daily.     empagliflozin (JARDIANCE) 10 MG TABS tablet Take 1 tablet (10 mg total) by mouth daily before breakfast. 30 tablet 3   EPINEPHrine 0.3 mg/0.3 mL IJ SOAJ injection Inject 0.3 mg into the muscle as needed.     ezetimibe (ZETIA) 10 MG tablet TAKE 1 TABLET BY MOUTH EVERY DAY 30 tablet 5   isosorbide mononitrate (IMDUR) 30 MG 24 hr tablet Take 1 tablet (30 mg total) by mouth daily. 90 tablet 3   methocarbamol (ROBAXIN) 750 MG tablet Take 1 tablet (750 mg total) by mouth every 8 (eight) hours as needed for muscle spasms. 45 tablet 0   mexiletine (MEXITIL) 150 MG capsule Take 2 capsules (300 mg total) by mouth 3 (three) times daily. 540 capsule 3   Multiple Vitamins-Minerals (MULTIVITAMINS THER. W/MINERALS) TABS Take 1 tablet by mouth daily.       nitroGLYCERIN (NITROSTAT) 0.4 MG SL tablet PLACE 1 TABLET UNDER THE TONGUE EVERY 5 MINUTES FOR 3 DOSES AS NEEDED CHEST PAIN 25 tablet 3   oxyCODONE-acetaminophen (PERCOCET) 10-325 MG tablet Take 1 tablet by mouth every 4 (four) hours as needed for pain.     pantoprazole  (PROTONIX) 40 MG tablet Take 1 tablet (40 mg total) by mouth daily. 30 tablet 11   potassium chloride SA (KLOR-CON) 20 MEQ tablet Take 2 tablets (40 mEq total) by mouth every morning AND 1 tablet (20 mEq total) every evening. 270 tablet 3   sacubitril-valsartan (ENTRESTO) 24-26 MG Take 1 tablet by mouth 2 (two) times daily. 180 tablet 3   sennosides-docusate sodium (SENOKOT-S) 8.6-50 MG tablet Take 1 tablet by mouth daily as needed.     spironolactone (ALDACTONE) 25 MG tablet TAKE 1 TABLET BY MOUTH EVERY DAY 30 tablet 11   torsemide (DEMADEX) 20 MG tablet Take 4 tablets (80 mg total) by mouth daily. Take 1 extra tablet as needed 360 tablet 3   traZODone (DESYREL) 100 MG tablet Take 100 mg by mouth at bedtime as needed for sleep.     warfarin (COUMADIN) 5 MG tablet Take 1 tablet (5 mg total) by  mouth daily at 4 PM. (Patient taking differently: Take 5 mg by mouth daily at 4 PM. Patient takes 5 mg tablet on Sunday Tuesday Thursday and Saturdays and takes 1/2  tablet on Monday Wednesday and Friday.) 30 tablet 6   No current facility-administered medications for this visit.   Allergies:  Beef-derived products, Metoclopramide hcl, Penicillins, Pork-derived products, Metoprolol, Reglan [metoclopramide], and Statins   Social History: The patient  reports that she quit smoking about 4 months ago. Her smoking use included cigarettes and e-cigarettes. She started smoking about 43 years ago. She has a 10.75 pack-year smoking history. She has never used smokeless tobacco. She reports that she does not drink alcohol and does not use drugs.   Family History: The patient's family history includes Ankylosing spondylitis in her sister; Heart attack in her brother; Heart disease in her sister; Heart disease (age of onset: 22) in her father; Hyperlipidemia in her father; Hypertension in her father and sister; Stomach cancer (age of onset: 58) in her brother; Uterine cancer in her mother.   ROS:  Please see the history  of present illness. Otherwise, complete review of systems is positive for {NONE DEFAULTED:18576}.  All other systems are reviewed and negative.   Physical Exam: VS:  There were no vitals taken for this visit., BMI There is no height or weight on file to calculate BMI.  Wt Readings from Last 3 Encounters:  11/15/21 203 lb (92.1 kg)  11/13/21 206 lb (93.4 kg)  10/23/21 206 lb 6.4 oz (93.6 kg)    General: Patient appears comfortable at rest. HEENT: Conjunctiva and lids normal, oropharynx clear with moist mucosa. Neck: Supple, no elevated JVP or carotid bruits, no thyromegaly. Lungs: Clear to auscultation, nonlabored breathing at rest. Cardiac: Regular rate and rhythm, no S3 or significant systolic murmur, no pericardial rub. Abdomen: Soft, nontender, no hepatomegaly, bowel sounds present, no guarding or rebound. Extremities: No pitting edema, distal pulses 2+. Skin: Warm and dry. Musculoskeletal: No kyphosis. Neuropsychiatric: Alert and oriented x3, affect grossly appropriate.  ECG:  An ECG dated 10/23/2021 was personally reviewed today and demonstrated:  Sinus bradycardia with IVCD.  Recent Labwork: 10/09/2021: Hemoglobin 14.1; Magnesium 2.0; Platelets 114 10/23/2021: ALT 34; AST 35; TSH 3.004 11/13/2021: B Natriuretic Peptide 211.8; BUN 11; Creatinine, Ser 1.03; Potassium 4.0; Sodium 136     Component Value Date/Time   CHOL 173 10/09/2021 1602   TRIG 138 10/09/2021 1602   HDL 44 10/09/2021 1602   CHOLHDL 3.9 10/09/2021 1602   CHOLHDL 3.9 12/16/2020 0341   VLDL 17 12/16/2020 0341   LDLCALC 104 (H) 10/09/2021 1602    Other Studies Reviewed Today:  ABIs 11/05/2021: Summary:  Right: Resting right ankle-brachial index is within normal range. No  evidence of significant right lower extremity arterial disease. The right  toe-brachial index is normal.   Left: Resting left ankle-brachial index indicates mild left lower  extremity arterial disease. The left toe-brachial index is  normal.   Assessment and Plan:    Medication Adjustments/Labs and Tests Ordered: Current medicines are reviewed at length with the patient today.  Concerns regarding medicines are outlined above.   Tests Ordered: No orders of the defined types were placed in this encounter.   Medication Changes: No orders of the defined types were placed in this encounter.   Disposition:  Follow up {follow up:15908}  Signed, [name], MD, [facility] 11/26/2021 8:22 AM    [facility] [address] [place], [place], [place]  [place] Phone: [phone]; Fax: [phone]

## 2021-11-28 ENCOUNTER — Encounter (HOSPITAL_COMMUNITY): Payer: Self-pay | Admitting: Cardiology

## 2021-11-30 ENCOUNTER — Ambulatory Visit (INDEPENDENT_AMBULATORY_CARE_PROVIDER_SITE_OTHER): Payer: Medicare PPO

## 2021-11-30 DIAGNOSIS — I472 Ventricular tachycardia, unspecified: Secondary | ICD-10-CM | POA: Diagnosis not present

## 2021-12-03 LAB — CUP PACEART REMOTE DEVICE CHECK
Battery Remaining Longevity: 108 mo
Battery Remaining Percentage: 100 %
Brady Statistic RA Percent Paced: 8 %
Brady Statistic RV Percent Paced: 3 %
Date Time Interrogation Session: 20221202021100
HighPow Impedance: 53 Ohm
Implantable Lead Implant Date: 20081119
Implantable Lead Implant Date: 20081119
Implantable Lead Location: 753859
Implantable Lead Location: 753860
Implantable Lead Model: 185
Implantable Lead Model: 4136
Implantable Lead Serial Number: 211124
Implantable Lead Serial Number: 28383796
Implantable Pulse Generator Implant Date: 20170303
Lead Channel Impedance Value: 470 Ohm
Lead Channel Impedance Value: 561 Ohm
Lead Channel Pacing Threshold Amplitude: 0.6 V
Lead Channel Pacing Threshold Amplitude: 1.2 V
Lead Channel Pacing Threshold Pulse Width: 0.5 ms
Lead Channel Pacing Threshold Pulse Width: 0.5 ms
Lead Channel Setting Pacing Amplitude: 2 V
Lead Channel Setting Pacing Amplitude: 2.4 V
Lead Channel Setting Pacing Pulse Width: 0.5 ms
Lead Channel Setting Sensing Sensitivity: 0.6 mV
Pulse Gen Serial Number: 204398

## 2021-12-04 ENCOUNTER — Ambulatory Visit (INDEPENDENT_AMBULATORY_CARE_PROVIDER_SITE_OTHER): Payer: Medicare PPO | Admitting: *Deleted

## 2021-12-04 ENCOUNTER — Other Ambulatory Visit: Payer: Self-pay | Admitting: *Deleted

## 2021-12-04 DIAGNOSIS — D6861 Antiphospholipid syndrome: Secondary | ICD-10-CM | POA: Diagnosis not present

## 2021-12-04 DIAGNOSIS — D6859 Other primary thrombophilia: Secondary | ICD-10-CM | POA: Diagnosis not present

## 2021-12-04 DIAGNOSIS — I63549 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: Secondary | ICD-10-CM | POA: Diagnosis not present

## 2021-12-04 DIAGNOSIS — Z5181 Encounter for therapeutic drug level monitoring: Secondary | ICD-10-CM

## 2021-12-04 LAB — POCT INR: INR: 4.5 — AB (ref 2.0–3.0)

## 2021-12-04 MED ORDER — SACUBITRIL-VALSARTAN 24-26 MG PO TABS: 1.0000 | ORAL_TABLET | Freq: Two times a day (BID) | ORAL | 3 refills | Status: DC

## 2021-12-04 NOTE — Patient Instructions (Signed)
Hold warfarin tonight and tomorrow night then resume 1 tablet daily except 1/2 tablet on Mondays, Wednesdays and Fridays Recheck in 2 wks Took Abx last week for UTI

## 2021-12-10 ENCOUNTER — Ambulatory Visit: Payer: Medicare PPO

## 2021-12-11 NOTE — Progress Notes (Signed)
Remote ICD transmission.   

## 2021-12-16 ENCOUNTER — Other Ambulatory Visit (HOSPITAL_COMMUNITY): Payer: Self-pay | Admitting: Cardiology

## 2021-12-18 ENCOUNTER — Ambulatory Visit: Payer: Medicare PPO | Admitting: Family Medicine

## 2021-12-18 ENCOUNTER — Other Ambulatory Visit: Payer: Self-pay

## 2021-12-18 ENCOUNTER — Ambulatory Visit (INDEPENDENT_AMBULATORY_CARE_PROVIDER_SITE_OTHER): Payer: Medicare PPO | Admitting: *Deleted

## 2021-12-18 DIAGNOSIS — I63549 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: Secondary | ICD-10-CM | POA: Diagnosis not present

## 2021-12-18 DIAGNOSIS — D6859 Other primary thrombophilia: Secondary | ICD-10-CM | POA: Diagnosis not present

## 2021-12-18 DIAGNOSIS — Z5181 Encounter for therapeutic drug level monitoring: Secondary | ICD-10-CM | POA: Diagnosis not present

## 2021-12-18 DIAGNOSIS — I4819 Other persistent atrial fibrillation: Secondary | ICD-10-CM | POA: Diagnosis not present

## 2021-12-18 LAB — POCT INR: INR: 4.1 — AB (ref 2.0–3.0)

## 2021-12-18 NOTE — Patient Instructions (Signed)
Hold warfarin tonight then decrease dose to 1/2 tablet daily except 1 tablet on Sundays, Tuesdays and Thursdays Recheck in 3 wks

## 2021-12-28 ENCOUNTER — Other Ambulatory Visit: Payer: Self-pay | Admitting: Cardiology

## 2022-01-01 ENCOUNTER — Telehealth: Payer: Self-pay | Admitting: Cardiology

## 2022-01-01 MED ORDER — SACUBITRIL-VALSARTAN 24-26 MG PO TABS: 1.0000 | ORAL_TABLET | Freq: Two times a day (BID) | ORAL | 0 refills | Status: DC

## 2022-01-01 NOTE — Telephone Encounter (Signed)
1 bottle samples provided

## 2022-01-01 NOTE — Telephone Encounter (Signed)
Pt came into office stating that her [name] [name] has not came in through the mail yet and that she ran out yesterday, would like to know if she could get some samples.

## 2022-01-03 ENCOUNTER — Encounter: Payer: Self-pay | Admitting: *Deleted

## 2022-01-03 NOTE — Progress Notes (Signed)
Fax notification received from [employer] [facility] that patient will receive entresto 24/26 mg free at no cost through 12/29/2022.

## 2022-01-04 ENCOUNTER — Encounter (HOSPITAL_COMMUNITY): Payer: Self-pay

## 2022-01-04 ENCOUNTER — Ambulatory Visit: Payer: Medicare PPO | Admitting: Cardiology

## 2022-01-07 ENCOUNTER — Ambulatory Visit: Payer: PPO | Admitting: Pharmacist

## 2022-01-07 NOTE — Progress Notes (Unsigned)
Patient ID: [name] [name]                 DOB: 12/22/1961                    MRN: [id number]     HPI: [name] [name] is a 60 y.o. female patient referred to lipid clinic by [facility]. PMH is significant for premature CAD (hx of MI in 2008, PCI in 01/20/21, repeat cath in 3/22 showed stable CTO D1 and LCx stent), PAD (peripheral arterial dopplers in 3/21 with early left-sided iliac disease, right anterior tibial disease; ABI 1.0 on right and 0.75 on left), CVA (total occlusion of RICA), anti-phospholipid antibody syndrome (on warfarin), ischemic cardiomyopathy, persistent atrial fibrillation (TEE-guided DCCV to NSR in 12/21), Ventricular tachycardia (scar-mediated in 12/21 with ICD discharge and syncope, VT ablation in 3/22, repeat VT in 10/22 with ATP), CHF (ECHO 8/21 EF 15-20%, stable NYHA class II symptoms, possible deconditioning), back pain, thrombocytopenia (chronic/mild), mitral regurgitation (moderate on 12/21 TEE.)  At previous visit in 10/2021, LDL as of 10/22 was above goal, she has been referred to lipid clinic for Repatha initiation given hx of statin intolerance. Scheduled for f/u with Dr. Aundra Dubin with ECHO in 2 months.  Plan:  - lipid panel and LFT labs - speak about statin intolerance - how long for symptoms to present - ezetimibe adherence ($30/90d) - recommend Repatha - cost ($45/30 day, $90/90 day), injection, overview - PAP discussion - lifestyle changes - smoking cessation, disability - what type of exercise is realistic  Current Medications: ezetimibe 10 mg daily Intolerances: atorvastatin 10 mg QD --> QOD (myalgia), rosuvastatin 10 mg twice weekly (myalgia) Risk Factors: CAD, hx of MI, hx of CVA, PCI in 1/22, CHF (EF 15-20%), PAD, active smoking, HTN LDL goal: <55 mg/dL  Diet:   Exercise: very little exercise due to V Tach and chronic back pain  Family History: Father had HLD, heart disease (age 31), HTN. Sister has heart disease, HTN. Brother had heart  attack.  Social History: disabled, still smokes about 1 pack per week (~4 cig/day), remote cocaine abuse, denies alcohol use  Labs:10/09/21: TC 173, TGL 138, LDL 104; AST/A:LT 74/76, 10/23/21: AST/ALT 35/34  Past Medical History:  Diagnosis Date   Aneurysm of internal carotid artery    Anti-phospholipid syndrome (HCC)    Anticardiolipin antibody positive    Chronic Coumadin   Brain aneurysm    followed by Dr. Estanislado Pandy   Chronic combined systolic (congestive) and diastolic (congestive) heart failure (HCC)    Cocaine abuse in remission [facility])    Coronary atherosclerosis of native coronary artery    Previous invasive cardiac testing was done at a facility in [place], [place].  Occluded diagonal, Diffuse LAD disease, Occluded Cx, and non obstructive RCA.    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  15 tablet 2   amiodarone (PACERONE) 200 MG tablet Take 1 tablet (200 mg total) by mouth 2 (two) times daily. 180 tablet 3   busPIRone (BUSPAR) 7.5 MG tablet Take 1 tablet (7.5 mg total) by mouth 2 (two) times daily as needed. 30 tablet 2   carvedilol (COREG) 12.5 MG tablet Take 12.5 mg by mouth 2 (two) times daily with a meal.     DULoxetine (CYMBALTA) 60 MG capsule Take 60 mg by mouth daily.     empagliflozin (JARDIANCE) 10 MG TABS tablet Take 1 tablet (10 mg total) by mouth daily before breakfast. 30 tablet 3    EPINEPHrine 0.3 mg/0.3 mL IJ SOAJ injection Inject 0.3 mg into the muscle as needed.     ezetimibe (ZETIA) 10 MG tablet TAKE 1 TABLET BY MOUTH EVERY DAY 30 tablet 11   isosorbide mononitrate (IMDUR) 30 MG 24 hr tablet Take 1 tablet (30 mg total) by mouth daily. 90 tablet 3   methocarbamol (ROBAXIN) 750 MG tablet Take 1 tablet (750 mg total) by mouth every 8 (eight) hours as needed for muscle spasms. 45 tablet 0   mexiletine (MEXITIL) 150 MG capsule Take 2 capsules (300 mg total) by mouth 3 (three) times daily. 540 capsule 3   Multiple Vitamins-Minerals (MULTIVITAMINS THER. W/MINERALS) TABS Take 1 tablet by mouth daily.       nitroGLYCERIN (NITROSTAT) 0.4 MG SL tablet PLACE 1 TABLET UNDER THE TONGUE EVERY 5 MINUTES FOR 3 DOSES AS NEEDED CHEST PAIN 25 tablet 3   oxyCODONE-acetaminophen (PERCOCET) 10-325 MG tablet Take 1 tablet by mouth every 4 (four) hours as needed for pain.     pantoprazole (PROTONIX) 40 MG tablet Take 1 tablet (40 mg total) by mouth daily. 30 tablet 11   potassium chloride SA (KLOR-CON) 20 MEQ tablet Take 2 tablets (40 mEq total) by mouth every morning AND 1 tablet (20 mEq total) every evening. 270 tablet 3   sacubitril-valsartan (ENTRESTO) 24-26 MG Take 1 tablet by mouth 2 (two) times daily. 28 tablet 0   sennosides-docusate sodium (SENOKOT-S) 8.6-50 MG tablet Take 1 tablet by mouth daily as needed.     spironolactone (ALDACTONE) 25 MG tablet TAKE 1 TABLET BY MOUTH EVERY DAY 30 tablet 11   torsemide (DEMADEX) 20 MG tablet Take 4 tablets (80 mg total) by mouth daily. Take 1 extra tablet as needed 360 tablet 3   traZODone (DESYREL) 100 MG tablet Take 100 mg by mouth at bedtime as needed for sleep.     warfarin (COUMADIN) 5 MG tablet Take 1 tablet (5 mg total) by mouth daily at 4 PM. (Patient taking differently: Take 5 mg by mouth daily at 4 PM. Patient takes 5 mg tablet on Sunday Tuesday Thursday and Saturdays and takes 1/2  tablet on Monday Wednesday and Friday.) 30 tablet 6   No  current facility-administered medications on file prior to visit.    Allergies  Allergen Reactions   Beef-Derived Products Anaphylaxis    From tick bite   Metoclopramide Hcl Anaphylaxis    Non responsive   Penicillins Anaphylaxis    Shock Has patient had a PCN reaction causing immediate rash, facial/tongue/throat swelling, SOB or lightheadedness with hypotension: Yes Has patient had a PCN reaction causing severe rash involving mucus membranes or skin necrosis: No Has patient had a PCN reaction that required hospitalization Yes Has patient had a PCN reaction occurring within the last 10 years: No If all of the above answers are "NO", then may proceed with  Cephalosporin    Pork-Derived Products Anaphylaxis    From tick bite. Has tolerated heparin/lovenox   Metoprolol Other (See Comments)    Syncope    Reglan [Metoclopramide]    Statins Other (See Comments)    Myalgias    Assessment/Plan:  1. Hyperlipidemia -

## 2022-01-08 ENCOUNTER — Ambulatory Visit (INDEPENDENT_AMBULATORY_CARE_PROVIDER_SITE_OTHER): Payer: PPO | Admitting: *Deleted

## 2022-01-08 DIAGNOSIS — D6861 Antiphospholipid syndrome: Secondary | ICD-10-CM

## 2022-01-08 DIAGNOSIS — Z5181 Encounter for therapeutic drug level monitoring: Secondary | ICD-10-CM

## 2022-01-08 DIAGNOSIS — I63549 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: Secondary | ICD-10-CM

## 2022-01-08 DIAGNOSIS — D6859 Other primary thrombophilia: Secondary | ICD-10-CM

## 2022-01-08 LAB — POCT INR: INR: 2.9 (ref 2.0–3.0)

## 2022-01-08 NOTE — Patient Instructions (Signed)
Continue warfarin 1/2 tablet daily except 1 tablet on Sundays, Tuesdays and Thursdays Recheck in 4 wks 

## 2022-01-14 ENCOUNTER — Other Ambulatory Visit (HOSPITAL_COMMUNITY): Payer: Self-pay | Admitting: Cardiology

## 2022-01-14 DIAGNOSIS — I5042 Chronic combined systolic (congestive) and diastolic (congestive) heart failure: Secondary | ICD-10-CM

## 2022-01-15 ENCOUNTER — Other Ambulatory Visit: Payer: Self-pay

## 2022-01-15 ENCOUNTER — Ambulatory Visit (INDEPENDENT_AMBULATORY_CARE_PROVIDER_SITE_OTHER): Payer: PPO | Admitting: Internal Medicine

## 2022-01-15 ENCOUNTER — Encounter: Payer: Self-pay | Admitting: Internal Medicine

## 2022-01-15 VITALS — BP 138/82 | HR 62 | Ht 65.5 in | Wt 209.0 lb

## 2022-01-15 DIAGNOSIS — I472 Ventricular tachycardia, unspecified: Secondary | ICD-10-CM

## 2022-01-15 MED ORDER — AMIODARONE HCL 200 MG PO TABS: ORAL_TABLET | ORAL | 3 refills | Status: DC

## 2022-01-15 NOTE — Patient Instructions (Signed)
Medication Instructions:   Take Amiodarone 1 1/2 Tablet Daily Monday -Thursday. Take 2 Tablets Daily on Friday - Sunday.   *If you need a refill on your cardiac medications before your next appointment, please call your pharmacy*   Lab Work: NONE   If you have labs (blood work) drawn today and your tests are completely normal, you will receive your results only by: MyChart Message (if you have MyChart) OR A paper copy in the mail If you have any lab test that is abnormal or we need to change your treatment, we will call you to review the results.   Testing/Procedures: NONE    Follow-Up: At [facility], you and your health needs are our priority.  As part of our continuing mission to provide you with exceptional heart care, we have created designated Provider Care Teams.  These Care Teams include your primary Cardiologist (physician) and Advanced Practice Providers (APPs -  Physician Assistants and Nurse Practitioners) who all work together to provide you with the care you need, when you need it.  We recommend signing up for the patient portal called "MyChart".  Sign up information is provided on this After Visit Summary.  MyChart is used to connect with patients for Virtual Visits (Telemedicine).  Patients are able to view lab/test results, encounter notes, upcoming appointments, etc.  Non-urgent messages can be sent to your provider as well.   To learn more about what you can do with MyChart, go to [email or web address].    Your next appointment:   6 month(s)  The format for your next appointment:   In Person  Provider:   Lewayne Bunting, MD    Other Instructions Thank you for choosing [facility]!

## 2022-01-15 NOTE — Progress Notes (Signed)
HPI [name] [name] returns today for followup of VT and chronic systolic heart failure. She is a pleasant 61 yo woman with the above problems and an elevated DFT. She had a VT ablation and has continued to have VT but with successful ATP. She has not had syncope. No edema. She has class 2 CHF symptoms. She has problems with anxiety. When I saw her last, she had more VT and we increased her amio up to 200 bid. Her VT has been quiet since. Allergies  Allergen Reactions   Beef-Derived Products Anaphylaxis    From tick bite   Metoclopramide Hcl Anaphylaxis    Non responsive   Penicillins Anaphylaxis    Shock Has patient had a PCN reaction causing immediate rash, facial/tongue/throat swelling, SOB or lightheadedness with hypotension: Yes Has patient had a PCN reaction causing severe rash involving mucus membranes or skin necrosis: No Has patient had a PCN reaction that required hospitalization Yes Has patient had a PCN reaction occurring within the last 10 years: No If all of the above answers are "NO", then may proceed with Cephalosporin    Pork-Derived Products Anaphylaxis    From tick bite. Has tolerated heparin/lovenox   Metoprolol Other (See Comments)    Syncope    Reglan [Metoclopramide]    Statins Other (See Comments)    Myalgias     Current Outpatient Medications  Medication Sig Dispense Refill   ALPRAZolam (XANAX) 0.25 MG tablet Take 1 tablet (0.25 mg total) by mouth daily as needed for anxiety. 15 tablet 2   amiodarone (PACERONE) 200 MG tablet Take Amiodarone 1 1/2 Tablet Daily Monday -Thursday. Take 2 Tablets Daily on Friday - Sunday 180 tablet 3   busPIRone (BUSPAR) 7.5 MG tablet Take 1 tablet (7.5 mg total) by mouth 2 (two) times daily as needed. 30 tablet 2   carvedilol (COREG) 12.5 MG tablet TAKE 1 TABLET BY MOUTH TWICE DAILY 120 tablet 3   DULoxetine (CYMBALTA) 60 MG capsule Take 60 mg by mouth daily.     empagliflozin (JARDIANCE) 10 MG TABS tablet Take 1 tablet (10  mg total) by mouth daily before breakfast. 30 tablet 3   EPINEPHrine 0.3 mg/0.3 mL IJ SOAJ injection Inject 0.3 mg into the muscle as needed.     ezetimibe (ZETIA) 10 MG tablet TAKE 1 TABLET BY MOUTH EVERY DAY 30 tablet 11   isosorbide mononitrate (IMDUR) 30 MG 24 hr tablet Take 1 tablet (30 mg total) by mouth daily. 90 tablet 3   methocarbamol (ROBAXIN) 750 MG tablet Take 1 tablet (750 mg total) by mouth every 8 (eight) hours as needed for muscle spasms. 45 tablet 0   mexiletine (MEXITIL) 150 MG capsule Take 2 capsules (300 mg total) by mouth 3 (three) times daily. 540 capsule 3   Multiple Vitamins-Minerals (MULTIVITAMINS THER. W/MINERALS) TABS Take 1 tablet by mouth daily.       nitroGLYCERIN (NITROSTAT) 0.4 MG SL tablet PLACE 1 TABLET UNDER THE TONGUE EVERY 5 MINUTES FOR 3 DOSES AS NEEDED CHEST PAIN 25 tablet 3   oxyCODONE-acetaminophen (PERCOCET) 10-325 MG tablet Take 1 tablet by mouth every 4 (four) hours as needed for pain.     pantoprazole (PROTONIX) 40 MG tablet Take 1 tablet (40 mg total) by mouth daily. 30 tablet 11   potassium chloride SA (KLOR-CON) 20 MEQ tablet Take 2 tablets (40 mEq total) by mouth every morning AND 1 tablet (20 mEq total) every evening. 270 tablet 3   sacubitril-valsartan (ENTRESTO)  24-26 MG Take 1 tablet by mouth 2 (two) times daily. 28 tablet 0   sennosides-docusate sodium (SENOKOT-S) 8.6-50 MG tablet Take 1 tablet by mouth daily as needed.     spironolactone (ALDACTONE) 25 MG tablet TAKE 1 TABLET BY MOUTH EVERY DAY 30 tablet 11   torsemide (DEMADEX) 20 MG tablet Take 4 tablets (80 mg total) by mouth daily. Take 1 extra tablet as needed 360 tablet 3   traZODone (DESYREL) 100 MG tablet Take 100 mg by mouth at bedtime as needed for sleep.     warfarin (COUMADIN) 5 MG tablet Take 1 tablet (5 mg total) by mouth daily at 4 PM. (Patient taking differently: Take 5 mg by mouth daily at 4 PM. Patient takes 5 mg tablet on Sunday Tuesday Thursday and Saturdays and takes 1/2   tablet on Monday Wednesday and Friday.) 30 tablet 6   No current facility-administered medications for this visit.     Past Medical History:  Diagnosis Date   Aneurysm of internal carotid artery    Anti-phospholipid syndrome (HCC)    Anticardiolipin antibody positive    Chronic Coumadin   Brain aneurysm    followed by Dr. Estanislado Pandy   Chronic combined systolic (congestive) and diastolic (congestive) heart failure (HCC)    Cocaine abuse in remission [facility])    Coronary atherosclerosis of native coronary artery    Previous invasive cardiac testing was done at a facility in [place], [place].  Occluded diagonal, Diffuse LAD disease, Occluded Cx, and non obstructive RCA.    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   Past Surgical History:  Procedure Laterality Date   ABDOMINAL AORTAGRAM N/A 05/25/2014   Procedure: ABDOMINAL AORTAGRAM;  Surgeon: Wellington Hampshire, MD;  Location: [place] CATH LAB;  Service: Cardiovascular;  Laterality: N/A;   APPENDECTOMY     BACK SURGERY  2010   Dr. Trenton Gammon L1 cage, 5 disc fusion   CARDIAC DEFIBRILLATOR PLACEMENT  2008   [employer] ICD   CARDIOVERSION N/A 12/01/2020   Procedure: CARDIOVERSION;  Surgeon: Larey Dresser, MD;  Location: [facility] ENDOSCOPY;  Service: Cardiovascular;  Laterality: N/A;   CORONARY STENT INTERVENTION N/A 01/08/2021   Procedure: CORONARY STENT INTERVENTION;  Surgeon: Martinique, Peter M, MD;  Location: [place] CV LAB;  Service: Cardiovascular;  Laterality: N/A;   EP IMPLANTABLE  DEVICE N/A 03/01/2016   Procedure: ICD Generator Changeout;  Surgeon: Evans Lance, MD;  Location: [place] CV LAB;  Service: Cardiovascular;  Laterality: N/A;   ICD/BIV ICD FUNCTION(DFT) TEST N/A 03/14/2021   Procedure: ICD/BIV ICD FUNCTION (DFT) TEST;  Surgeon: Evans Lance, MD;  Location: [place] CV LAB;  Service: Cardiovascular;  Laterality: N/A;   L1 corpectomy   11/29/2009   Laryngeal polyp excision     RIGHT/LEFT HEART CATH AND CORONARY ANGIOGRAPHY N/A 12/18/2020   Procedure: RIGHT/LEFT HEART CATH AND CORONARY ANGIOGRAPHY;  Surgeon: Jolaine Artist, MD;  Location: [place] CV LAB;  Service: Cardiovascular;  Laterality: N/A;   RIGHT/LEFT HEART CATH AND CORONARY ANGIOGRAPHY N/A 03/05/2021   Procedure: RIGHT/LEFT HEART CATH AND CORONARY ANGIOGRAPHY;  Surgeon: Larey Dresser, MD;  Location: [place] CV LAB;  Service: Cardiovascular;  Laterality: N/A;   TEE WITHOUT CARDIOVERSION N/A 12/01/2020   Procedure: TRANSESOPHAGEAL ECHOCARDIOGRAM (TEE);  Surgeon: Larey Dresser, MD;  Location: [facility] ENDOSCOPY;  Service: Cardiovascular;  Laterality: N/AStephanie [name] ABLATION N/A 03/08/2021   Procedure: [name] [name] ABLATION;  Surgeon: Vickie Epley, MD;  Location: [employer] CV LAB;  Service: Cardiovascular;  Laterality: N/A;     Family History  Problem Relation Age of Onset   Uterine cancer Mother    Heart disease Father 72   Hyperlipidemia Father    Hypertension Father    Ankylosing spondylitis Sister    Heart disease Sister    Hypertension Sister    Stomach cancer Brother 26   Heart attack Brother      Social History   Socioeconomic History   Marital status: Single    Spouse name: Not on file   Number of children: Not on file   Years of education: Not on file   Highest education level: Not on file  Occupational History   Occupation: Disabled    Comment: ESL  Tobacco Use   Smoking status: Former    Packs/day: 0.25    Years: 43.00    Pack years: 10.75     Types: Cigarettes, E-cigarettes    Start date: 03/03/1978    Quit date: 07/21/2021    Years since quitting: 0.4   Smokeless tobacco: Never   Tobacco comments:    4 ciggs per day  Vaping Use   Vaping Use: Every day  Substance and Sexual Activity   Alcohol use: No    Alcohol/week: 0.0 standard drinks   Drug use: No    Comment: Prior history of cocaine   Sexual activity: Not Currently    Birth control/protection: None  Other Topics Concern   Not on file  Social History Narrative   Not on file   Social Determinants of Health   Financial Resource Strain: Low Risk    Difficulty of Paying Living Expenses: Not very hard  Food Insecurity: No Food Insecurity   Worried About Charity fundraiser in the Last Year: Never true   Ran Out of Food in the Last Year: Never true  Transportation Needs: No Transportation Needs   Lack of Transportation (Medical): No   Lack of Transportation (Non-Medical): No  Physical Activity: Not on file  Stress: Not on file  Social Connections: Not on file  Intimate Partner Violence: Not on file     BP 138/82    Pulse 62    Ht 5' 5.5" (1.664 m)    Wt 209 lb (94.8 kg)    SpO2 97%    BMI 34.25 kg/m   Physical Exam:  Well appearing NAD HEENT: Unremarkable Neck:  No JVD, no thyromegally Lymphatics:  No adenopathy Back:  No CVA tenderness Lungs:  Clear HEART:  Regular rate rhythm, no murmurs, no rubs, no clicks Abd:  soft, positive bowel sounds, no organomegally, no rebound, no guarding Ext:  2 plus pulses, no edema, no cyanosis, no clubbing Skin:  No rashes no nodules Neuro:  CN II through XII intact, motor grossly intact  DEVICE  Normal device function.  See PaceArt for details.             Assess/Plan:  1. VT -  She has had several eventually successful ATP's. I have asked her to continue her dose of amio to 400 mg daily on Fri/Sat/Sun and 300 daily Mon-Thurs. . Continue mexitil but will increase to 300 mg 3 times a day. We discussed repeat VT  ablation. One of the problems before was that she had unstable VT. Now that her VT is slower she has tolerated it well. I would expect it to improve further with more mexitil. We discussed repeat ablation. I will see her  back in 6 months and plan to consider repeat ablation if she is still having recurrent VT. 2. Chronic systolic heart failure - her symptoms are class 2. We will follow. 3. ICD - her [place] device is working normally. We will recheck in several months. 4. dyslipidemia - she will continue her statin therapy.    Carleene Overlie Aizah Gehlhausen,MD

## 2022-01-28 ENCOUNTER — Ambulatory Visit (HOSPITAL_BASED_OUTPATIENT_CLINIC_OR_DEPARTMENT_OTHER)
Admission: RE | Admit: 2022-01-28 | Discharge: 2022-01-28 | Disposition: A | Discharge status: Home / Self Care | Payer: PPO | Source: Ambulatory Visit | Attending: Cardiology | Admitting: Cardiology

## 2022-01-28 ENCOUNTER — Ambulatory Visit (HOSPITAL_COMMUNITY)
Admission: RE | Admit: 2022-01-28 | Discharge: 2022-01-28 | Disposition: A | Discharge status: Home / Self Care | Payer: PPO | Source: Ambulatory Visit | Attending: Cardiology | Admitting: Cardiology

## 2022-01-28 ENCOUNTER — Encounter: Payer: Self-pay | Admitting: Cardiology

## 2022-01-28 ENCOUNTER — Telehealth (HOSPITAL_COMMUNITY): Payer: Self-pay | Admitting: Pharmacy Technician

## 2022-01-28 ENCOUNTER — Other Ambulatory Visit: Payer: Self-pay

## 2022-01-28 ENCOUNTER — Other Ambulatory Visit (HOSPITAL_COMMUNITY): Payer: Self-pay

## 2022-01-28 ENCOUNTER — Encounter (HOSPITAL_COMMUNITY): Payer: Self-pay | Admitting: Cardiology

## 2022-01-28 VITALS — BP 128/70 | HR 64 | Wt 208.8 lb

## 2022-01-28 DIAGNOSIS — J449 Chronic obstructive pulmonary disease, unspecified: Secondary | ICD-10-CM | POA: Diagnosis not present

## 2022-01-28 DIAGNOSIS — I255 Ischemic cardiomyopathy: Secondary | ICD-10-CM | POA: Insufficient documentation

## 2022-01-28 DIAGNOSIS — I340 Nonrheumatic mitral (valve) insufficiency: Secondary | ICD-10-CM | POA: Insufficient documentation

## 2022-01-28 DIAGNOSIS — Z95810 Presence of automatic (implantable) cardiac defibrillator: Secondary | ICD-10-CM | POA: Insufficient documentation

## 2022-01-28 DIAGNOSIS — I252 Old myocardial infarction: Secondary | ICD-10-CM | POA: Insufficient documentation

## 2022-01-28 DIAGNOSIS — I6521 Occlusion and stenosis of right carotid artery: Secondary | ICD-10-CM | POA: Insufficient documentation

## 2022-01-28 DIAGNOSIS — Z7901 Long term (current) use of anticoagulants: Secondary | ICD-10-CM | POA: Insufficient documentation

## 2022-01-28 DIAGNOSIS — R2689 Other abnormalities of gait and mobility: Secondary | ICD-10-CM | POA: Insufficient documentation

## 2022-01-28 DIAGNOSIS — I5042 Chronic combined systolic (congestive) and diastolic (congestive) heart failure: Secondary | ICD-10-CM | POA: Insufficient documentation

## 2022-01-28 DIAGNOSIS — E785 Hyperlipidemia, unspecified: Secondary | ICD-10-CM

## 2022-01-28 DIAGNOSIS — J984 Other disorders of lung: Secondary | ICD-10-CM

## 2022-01-28 DIAGNOSIS — Z955 Presence of coronary angioplasty implant and graft: Secondary | ICD-10-CM | POA: Insufficient documentation

## 2022-01-28 DIAGNOSIS — I110 Hypertensive heart disease with heart failure: Secondary | ICD-10-CM | POA: Insufficient documentation

## 2022-01-28 DIAGNOSIS — T462X5A Adverse effect of other antidysrhythmic drugs, initial encounter: Secondary | ICD-10-CM

## 2022-01-28 DIAGNOSIS — I6529 Occlusion and stenosis of unspecified carotid artery: Secondary | ICD-10-CM | POA: Diagnosis not present

## 2022-01-28 DIAGNOSIS — D6861 Antiphospholipid syndrome: Secondary | ICD-10-CM | POA: Insufficient documentation

## 2022-01-28 DIAGNOSIS — I2510 Atherosclerotic heart disease of native coronary artery without angina pectoris: Secondary | ICD-10-CM | POA: Diagnosis not present

## 2022-01-28 DIAGNOSIS — I472 Ventricular tachycardia, unspecified: Secondary | ICD-10-CM | POA: Insufficient documentation

## 2022-01-28 DIAGNOSIS — I739 Peripheral vascular disease, unspecified: Secondary | ICD-10-CM | POA: Diagnosis not present

## 2022-01-28 DIAGNOSIS — F17210 Nicotine dependence, cigarettes, uncomplicated: Secondary | ICD-10-CM | POA: Insufficient documentation

## 2022-01-28 DIAGNOSIS — K219 Gastro-esophageal reflux disease without esophagitis: Secondary | ICD-10-CM | POA: Insufficient documentation

## 2022-01-28 DIAGNOSIS — R29898 Other symptoms and signs involving the musculoskeletal system: Secondary | ICD-10-CM | POA: Insufficient documentation

## 2022-01-28 DIAGNOSIS — Z7984 Long term (current) use of oral hypoglycemic drugs: Secondary | ICD-10-CM | POA: Insufficient documentation

## 2022-01-28 DIAGNOSIS — Z8249 Family history of ischemic heart disease and other diseases of the circulatory system: Secondary | ICD-10-CM | POA: Insufficient documentation

## 2022-01-28 DIAGNOSIS — Z8673 Personal history of transient ischemic attack (TIA), and cerebral infarction without residual deficits: Secondary | ICD-10-CM | POA: Insufficient documentation

## 2022-01-28 DIAGNOSIS — I771 Stricture of artery: Secondary | ICD-10-CM | POA: Insufficient documentation

## 2022-01-28 DIAGNOSIS — Z79899 Other long term (current) drug therapy: Secondary | ICD-10-CM | POA: Diagnosis not present

## 2022-01-28 DIAGNOSIS — D696 Thrombocytopenia, unspecified: Secondary | ICD-10-CM | POA: Insufficient documentation

## 2022-01-28 DIAGNOSIS — I4819 Other persistent atrial fibrillation: Secondary | ICD-10-CM | POA: Diagnosis not present

## 2022-01-28 LAB — LIPID PANEL
Cholesterol: 164 mg/dL (ref 0–200)
HDL: 56 mg/dL (ref >40)
LDL Cholesterol: 85 mg/dL (ref 0–99)
Total CHOL/HDL Ratio: 2.9 RATIO
Triglycerides: 114 mg/dL (ref <150)
VLDL: 23 mg/dL (ref 0–40)

## 2022-01-28 LAB — COMPREHENSIVE METABOLIC PANEL
ALT: 35 U/L (ref 0–44)
AST: 32 U/L (ref 15–41)
Albumin: 3.6 g/dL (ref 3.5–5.0)
Alkaline Phosphatase: 118 U/L (ref 38–126)
Anion gap: 11 (ref 5–15)
BUN: 14 mg/dL (ref 6–20)
CO2: 30 mmol/L (ref 22–32)
Calcium: 8.9 mg/dL (ref 8.9–10.3)
Chloride: 100 mmol/L (ref 98–111)
Creatinine, Ser: 1.17 mg/dL — ABNORMAL HIGH (ref 0.44–1.00)
GFR, Estimated: 54 mL/min — ABNORMAL LOW (ref >60)
Glucose, Bld: 110 mg/dL — ABNORMAL HIGH (ref 70–99)
Potassium: 4.2 mmol/L (ref 3.5–5.1)
Sodium: 141 mmol/L (ref 135–145)
Total Bilirubin: 0.6 mg/dL (ref 0.3–1.2)
Total Protein: 7.7 g/dL (ref 6.5–8.1)

## 2022-01-28 LAB — ECHOCARDIOGRAM COMPLETE
AR max vel: 2.89 cm2
AV Area VTI: 2.66 cm2
AV Area mean vel: 2.81 cm2
AV Mean grad: 4 mmHg
AV Peak grad: 7.6 mmHg
Ao pk vel: 1.38 m/s
Area-P 1/2: 1.97 cm2
Calc EF: 29.5 %
MV M vel: 4.5 m/s
MV Peak grad: 81 mmHg
MV VTI: 2.24 cm2
S' Lateral: 6.2 cm
Single Plane A2C EF: 26.1 %
Single Plane A4C EF: 29.4 %

## 2022-01-28 LAB — SEDIMENTATION RATE: Sed Rate: 30 mm/hr — ABNORMAL HIGH (ref 0–22)

## 2022-01-28 LAB — TSH: TSH: 1.149 u[IU]/mL (ref 0.350–4.500)

## 2022-01-28 MED ORDER — EMPAGLIFLOZIN 10 MG PO TABS: 10.0000 mg | ORAL_TABLET | Freq: Every day | ORAL | 3 refills | Status: DC

## 2022-01-28 NOTE — Progress Notes (Signed)
°  Echocardiogram 2D Echocardiogram has been performed.  [name] [name] 01/28/2022, 2:46 PM

## 2022-01-28 NOTE — Patient Instructions (Addendum)
EKG done today.  RedsClip done today.   Labs done today. We will contact you only if your labs are abnormal.  RESTART Jardiance 10mg  (1 tablet) by mouth daily.   INCREASE Entresto to 49-51mg  (1 tablet) by mouth 2 times daily.   No other medication changes were made. Please continue all current medications as prescribed.  You have been referred to the Lipid Clinic and to the Pharmacy Clinic. Both offices will contact you to schedule an appointment.  Your physician has requested that you have a carotid duplex. This test is an ultrasound of the carotid arteries in your neck. It looks at blood flow through these arteries that supply the brain with blood. Allow one hour for this exam. There are no restrictions or special instructions.  Your physician has recommended that you have a pulmonary function test. Pulmonary Function Tests are a group of tests that measure how well air moves in and out of your lungs. We will contact you to schedule this at a later date.   Your physician recommends that you schedule a follow-up appointment in: 10 days for a lab only appointment(can be done in [place]) and in 2 months with Dr. Garrison  If you have any questions or concerns before your next appointment please send Shirlee Latch a message through [place] or call our office at [phone].    TO LEAVE A MESSAGE FOR THE NURSE SELECT OPTION 2, PLEASE LEAVE A MESSAGE INCLUDING: YOUR NAME DATE OF BIRTH CALL BACK NUMBER REASON FOR CALL**this is important as we prioritize the call backs  YOU WILL RECEIVE A CALL BACK THE SAME DAY AS LONG AS YOU CALL BEFORE 4:00 PM   Do the following things EVERYDAY: Weigh yourself in the morning before breakfast. Write it down and keep it in a log. Take your medicines as prescribed Eat low salt foods--Limit salt (sodium) to 2000 mg per day.  Stay as active as you can everyday Limit all fluids for the day to less than 2 liters   At the [facility], you and your  health needs are our priority. As part of our continuing mission to provide you with exceptional heart care, we have created designated Provider Care Teams. These Care Teams include your primary Cardiologist (physician) and Advanced Practice Providers (APPs- Physician Assistants and Nurse Practitioners) who all work together to provide you with the care you need, when you need it.   You may see any of the following providers on your designated Care Team at your next follow up: Dr [phone] Dr [name], NP Carron Curie, Robbie Lis [place], PharmD   Please be sure to bring in all your medications bottles to every appointment.

## 2022-01-28 NOTE — Progress Notes (Signed)
ReDS Vest / Clip - 01/28/22 1500       ReDS Vest / Clip   Station Marker B    Ruler Value 32    ReDS Value Range Low volume    ReDS Actual Value 29

## 2022-01-28 NOTE — Telephone Encounter (Signed)
Advanced Heart Failure Patient Advocate Encounter  The patient was approved for a PAN HF grant that will help cover the cost of Jardiance (30 days, $45).  Member ID: [id number] Group ID: [id number] RxBin ID: [id number] PCN: [id number] Eligibility Start Date: 10/30/2021 Eligibility End Date: 01/27/2023 Assistance Amount: $1,200.00  The patient was provided a copy of the grant information to give to the pharmacy. Will not seek Jardiance assistance at this time with [facility].  Charlann Boxer, CPhT

## 2022-01-29 ENCOUNTER — Other Ambulatory Visit (HOSPITAL_COMMUNITY): Payer: Self-pay

## 2022-01-29 MED ORDER — ENTRESTO 49-51 MG PO TABS: 1.0000 | ORAL_TABLET | Freq: Two times a day (BID) | ORAL | 3 refills | Status: DC

## 2022-01-29 NOTE — Progress Notes (Signed)
PCP: Loman Brooklyn, FNP Cardiology: Dr. Domenic Polite HF Cardiology: Dr. Aundra Dubin  61 y.o. with history of CVA, anti-phospholipid antibody syndrome, CAD, ischemic cardiomyopathy, and persistent atrial fibrillation was referred by Dr. Domenic Polite for evaluation of CHF. Patient had a cath in 2008 in setting of an MI in [place].  Diagonal and LCx were totally occluded, there was diffuse LAD disease.  She has not had a cath since that time. She has a [place].  Echo in 8/21 showed EF 15-20%, anteroseptal akinesis, normal RV, severe LAE, mild MR. She was admitted in 8/21 with CHF exacerbation for 6 days.  She went into atrial fibrillation in 11/21.   In 12/21, she had TEE-guided DCCV back to NSR.  TEE showed EF 25-30%, mildly decreased RV systolic function, moderate MR.    Later in 12/21, she was admitted with syncope and VT with ICD shock.  RHC/LHC showed chronically occluded D1 and 95% proximal LCx; normal filling pressures with CI 3.0. Platelets were low, no intervention was done. Amiodarone was started.   In 1/22, she was admitted again with VT and presyncope, HS-TnI was negative.  She had PCI to LCx this admission.  She was started on mexiletine in addition to amiodarone.   She was readmitted later in 1/22 with chest pain, HS-TnI was negative and she was discharged.   She was admitted again in 3/22 with recurrent VT.  LHC/RHC showed totally occluded D1 (chronic) with patent LCx stent and filling pressures were normal with normal CO.  She had a VT ablation with Dr. Curt Bears.   She was admitted with VT and syncope in 6/22, troponin was negative.  She had VT treated with ATP in 10/22, mexiletine was increased.   Echo was done today and reviewed, EF 30-35%, LAD territory wall motion abnormalities, normal RV, IVC normal, moderate MR.   She returns for followup of CHF, VT, and CAD.  Still smoking about 1 pack/week.  She has been having trouble with balance, attributes this to amiodarone. No falls.  She  was short of breath walking into the office today, reports that in general, she is dyspneic with moderate activity. Does fine walking around her house.  No orthopnea/PND. No chest pain.  Unable to take statins due to muscle aching, tolerates Zetia.  Weight up 2 lbs. Not very active.   [employer] device interrogation: No AF, last NSVT run was in 12/22.   ECG (personally reviewed): NSR, septal Qs.   REDS clip 29%  Labs (8/21): K 4.4, creatinine 0.58, LDL 94  Labs (12/21): LDL 72, HDL 31, TGs 85, K 4.4, creatinine 0.8, hgb 14.6, plts 70K Labs (1/22): K 3.3, creatinine 0.94, plts 58K Labs (3/22): K 3.8, creatinine 0.99, hgb 11.9, plts 160 Labs (4/22): hgb 14.2, plts 152, digoxin 0.2, K 4.1, creatinine 1.0 Labs (10/22): LDL 104, AST 74, ALT 76, plts 114, K 4.3, creatinine 1.11 Labs (11/22): BNP 212, K 4, creatinine 1.03  PMH: 1. Brain aneurysm: Followed by Dr. Estanislado Pandy.  2. HTN 3. CVA: Total occlusion of RICA.  - Carotid dopplers (12/21): Occluded RICA, < [id number] stenosis LICA.  - Carotid dopplers (3/22): Occluded RICA, [id number] LICA 4. Anti-phospholipid antibody syndrome: Diagnosed at [facility].  - On warfarin and ASA 81 given history of CVA.  5. Remote cocaine abuse.  6. PAD: peripheral arterial dopplers in 3/21 with early left-sided iliac disease, right anterior tibial disease; ABI 1.0 on right and 0.75 on left.  - ABIs (11/22): 1.05 right, 0.9 left.  7. Active smoker.  8. L-spine arthritis.  9. CAD: MI in 2008 in [facility], cath at the time showed totally occluded diagonal, totally occluded LCX, and diffuse LAD disease.  No intervention.   - LHC (12/21): chronically occluded D1, 45% mid LAD, 95% proximal LCx.  - PCI to proximal LCx in 1/22.  - LHC (3/22): CTO D1, patent LCx stent.  10. Atrial fibrillation: Persistent, started in 11/21.  - TEE-DCCV back to NSR in 12/21.  11. Chronic systolic CHF: Ischemic cardiomyopathy.  [place].  - Echo (8/21): EF 15-20%, anteroseptal  AK, RV normal, severe LAE, mild MR.  - TEE (12/21): EF 25-30%, mildly decreased RV systolic function, moderate MR with ERO 0.26 cm^2 (probably functional).   - RHC (12/21): mean RA 1, PA 24/6, mean PCWP 12, CI 3.0 - RHC (3/22): mean RA 1, PA 19/4, mean PCWP 7, CI 2.69 - Echo (1/23): EF 30-35%, LAD territory wall motion abnormalities, normal RV, IVC normal, moderate MR.  12. Hyperlipidemia: Statin intolerant.  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   14. Mitral regurgitation: Moderate on 12/21 TEE.  15. Ventricular tachycardia: 3/22 VT ablation.   FH: Father, grandfather with MIs.  Brother with MI in his 28s.   SH: Lives in [place], on disability, smokes 4 cigs/day.  Prior cocaine.  No ETOH.   ROS: All systems reviewed and negative except as per HPI.   Current Outpatient Medications  Medication Sig Dispense Refill   ALPRAZolam (XANAX) 0.25 MG tablet Take 1 tablet (0.25 mg total) by mouth daily as needed for anxiety. 15 tablet 2   amiodarone (PACERONE) 200 MG tablet Take Amiodarone 1 1/2 Tablet Daily Monday -Thursday. Take 2 Tablets Daily on Friday - Sunday 180 tablet 3   carvedilol (COREG) 12.5 MG tablet TAKE 1 TABLET BY MOUTH TWICE DAILY 120 tablet 3   DULoxetine (CYMBALTA) 60 MG capsule Take 60 mg by mouth daily.     EPINEPHrine 0.3 mg/0.3 mL IJ SOAJ injection Inject 0.3 mg into the muscle as needed.     ezetimibe (ZETIA) 10 MG tablet TAKE 1 TABLET BY MOUTH EVERY DAY 30 tablet 11   isosorbide mononitrate (IMDUR) 30 MG 24 hr tablet Take 1 tablet (30 mg total) by mouth daily. 90 tablet 3   methocarbamol (ROBAXIN) 750 MG tablet Take 1 tablet (750 mg total) by mouth every 8 (eight) hours as needed for muscle spasms. 45 tablet 0   mexiletine (MEXITIL) 150 MG capsule Take 2 capsules (300 mg total) by mouth 3 (three) times daily. 540 capsule 3   Multiple Vitamins-Minerals (MULTIVITAMINS THER. W/MINERALS) TABS Take 1 tablet by mouth daily.       nitroGLYCERIN (NITROSTAT) 0.4 MG SL tablet PLACE  1 TABLET UNDER THE TONGUE EVERY 5 MINUTES FOR 3 DOSES AS NEEDED CHEST PAIN 25 tablet 3   oxyCODONE-acetaminophen (PERCOCET) 10-325 MG tablet Take 1 tablet by mouth every 4 (four) hours as needed for pain.     pantoprazole (PROTONIX) 40 MG tablet Take 1 tablet (40 mg total) by mouth daily. 30 tablet 11   potassium chloride SA (KLOR-CON) 20 MEQ tablet Take 2 tablets (40 mEq total) by mouth every morning AND 1 tablet (20 mEq total) every evening. 270 tablet 3   sacubitril-valsartan (ENTRESTO) 24-26 MG Take 1 tablet by mouth 2 (two) times daily. 28 tablet 0   sennosides-docusate sodium (SENOKOT-S) 8.6-50 MG tablet Take 1 tablet by mouth daily as needed.     spironolactone (ALDACTONE) 25 MG tablet TAKE 1 TABLET  BY MOUTH EVERY DAY 30 tablet 11   torsemide (DEMADEX) 20 MG tablet Take 4 tablets (80 mg total) by mouth daily. Take 1 extra tablet as needed 360 tablet 3   traZODone (DESYREL) 100 MG tablet Take 100 mg by mouth at bedtime as needed for sleep.     warfarin (COUMADIN) 5 MG tablet Take 1 tablet (5 mg total) by mouth daily at 4 PM. (Patient taking differently: Take 5 mg by mouth daily at 4 PM. Patient takes 5 mg tablet on Sunday Tuesday Thursday and Saturdays and takes 1/2  tablet on Monday Wednesday and Friday.) 30 tablet 6   empagliflozin (JARDIANCE) 10 MG TABS tablet Take 1 tablet (10 mg total) by mouth daily before breakfast. 90 tablet 3   No current facility-administered medications for this encounter.   BP 128/70    Pulse 64    Wt 94.7 kg (208 lb 12.8 oz)    SpO2 99%    BMI 34.22 kg/m  General: NAD Neck: No JVD, no thyromegaly or thyroid nodule.  Lungs: Clear to auscultation bilaterally with normal respiratory effort. CV: Nondisplaced PMI.  Heart regular S1/S2, no S3/S4, no murmur.  No peripheral edema.  No carotid bruit.  Normal pedal pulses.  Abdomen: Soft, nontender, no hepatosplenomegaly, no distention.  Skin: Intact without lesions or rashes.  Neurologic: Alert and oriented x 3.   Psych: Normal affect. Extremities: No clubbing or cyanosis.  HEENT: Normal.   Assessment/Plan: 1. CAD: MI in 2008 in [facility], cath at the time showed totally occluded diagonal, totally occluded LCX, and diffuse LAD disease.  No intervention. LHC was done in 12/21 due to VT, this showed chronically occluded D1 and 95% proximal LCx.  Initially, no PCI due to thrombocytopenia.  She eventually had PCI to proximal LCx in 1/22.  Repeat cath in 3/22 showed stable CTO D1 and patent LCx stent.  - Continue Zetia, has not tolerated statins.  LDL in 10/22 above goal, refer again to lipid clinic for [place] (missed her prior appointment).  - She is on warfarin, no ASA with stable CAD.  2. Atrial fibrillation: TEE-guided DCCV to NSR in 12/21.  She is in NSR today.   - She is on amiodarone.  If atrial fibrillation recurs, will need to consider ablation.  - Continue warfarin.   3. Carotid stenosis: Known RICA occlusion.   - Repeat dopplers in 3/23.  4. 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  - She is on warfarin.  5. Chronic systolic CHF: Ischemic cardiomyopathy.  [place].  Echo (8/21) with EF 15-20%, anteroseptal AK, RV normal, severe LAE, mild MR.  TEE (12/21) with EF 25-30%.  [place] in 12/21 and again in 3/22 with preserved cardiac output and optimized filling pressures.  Echo today showed EF 30-35%, LAD territory wall motion abnormalities, normal RV, IVC normal, moderate MR. Weight is up but she does not look volume overloaded by exam or REDS clip.  Chronic NYHA class III symptoms => suspect deconditioning and possibly COPD play a role in her dyspnea.  - Increase Entresto to 49/51 bid with BMET today and in 10 days.   - Refill empagliflozin 10 mg daily (has been out for a few days).   - Continue spironolactone 25 mg daily.  - Continue Coreg 12.5 mg bid.   - Continue torsemide 80 mg daily.  - I will refer her to cardiac rehab at [facility].  6. Smoking: Active smoker, wants to  stop on her own. She has  been cutting back.  - I will arrange for PFTs => ?COPD contributing to her dyspnea.  7. PAD: She has some symptoms of claudication, no pedal ulcerations. ABIs 11/22 with mild decrease on left, normal on right.  - Needs to quit smoking.  8. 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  Platelets lately have been back in normal range.  - Has seen hematology.  9. Mitral regurgitation: Moderate on today's echo, suspect functional.  10. VT: Suspect scar-mediated in 12/21 with ICD discharge and syncope. She had recurrent VT in 3/22.  She had VT ablation in 3/22.  Recent recurrent VT in 10/22 with ATP.  She is now on amiodarone 300 mg daily alternating with 400 mg daily + mexiletine.  I am concerned that amiodarone is the cause of her balance difficulty.  She is not orthostatic.  Also need to assess for any evidence for amiodarone lung toxicity given dyspnea despite euvolemia.  - Continue amiodarone with dosing per EP.  CMET/TSH today.  She will need a regular eye exam.  Given dyspnea without significant volume overload, will get PFTs to look for any evidence of amiodarone lung toxicity. Additionally, amiodarone may be causing her imbalance (worse on higher dose).  - Continue mexiletine.  - Continue Coreg.   - Will discuss redo VT ablation with Dr. Lovena Le to see if we could get her off amiodarone or at least decrease the dose.   Followup in 2 months with APP.   Loralie Champagne 01/29/2022

## 2022-01-30 ENCOUNTER — Other Ambulatory Visit (HOSPITAL_COMMUNITY): Payer: Self-pay | Admitting: Interventional Radiology

## 2022-01-30 DIAGNOSIS — I671 Cerebral aneurysm, nonruptured: Secondary | ICD-10-CM

## 2022-02-01 ENCOUNTER — Other Ambulatory Visit (HOSPITAL_COMMUNITY)
Admission: RE | Admit: 2022-02-01 | Discharge: 2022-02-01 | Disposition: A | Discharge status: Home / Self Care | Payer: PPO | Source: Ambulatory Visit | Attending: Cardiology | Admitting: Cardiology

## 2022-02-01 DIAGNOSIS — Z01818 Encounter for other preprocedural examination: Secondary | ICD-10-CM

## 2022-02-04 ENCOUNTER — Encounter (HOSPITAL_COMMUNITY): Payer: PPO

## 2022-02-05 ENCOUNTER — Other Ambulatory Visit: Payer: Self-pay

## 2022-02-05 ENCOUNTER — Encounter (HOSPITAL_COMMUNITY): Payer: PPO

## 2022-02-05 ENCOUNTER — Ambulatory Visit (INDEPENDENT_AMBULATORY_CARE_PROVIDER_SITE_OTHER): Payer: PPO | Admitting: *Deleted

## 2022-02-05 DIAGNOSIS — Z5181 Encounter for therapeutic drug level monitoring: Secondary | ICD-10-CM | POA: Diagnosis not present

## 2022-02-05 DIAGNOSIS — D6859 Other primary thrombophilia: Secondary | ICD-10-CM | POA: Diagnosis not present

## 2022-02-05 DIAGNOSIS — I63549 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: Secondary | ICD-10-CM | POA: Diagnosis not present

## 2022-02-05 DIAGNOSIS — D6861 Antiphospholipid syndrome: Secondary | ICD-10-CM | POA: Diagnosis not present

## 2022-02-05 LAB — POCT INR: INR: 3.9 — AB (ref 2.0–3.0)

## 2022-02-05 NOTE — Patient Instructions (Signed)
Hold warfarin tonight then resume 1/2 tablet daily except 1 tablet on Sundays, Tuesdays and Thursdays Recheck in 4 wk

## 2022-02-09 ENCOUNTER — Encounter: Payer: Self-pay | Admitting: Cardiology

## 2022-02-12 ENCOUNTER — Other Ambulatory Visit: Payer: Self-pay | Admitting: *Deleted

## 2022-02-12 MED ORDER — ENTRESTO 49-51 MG PO TABS: 1.0000 | ORAL_TABLET | Freq: Two times a day (BID) | ORAL | 0 refills | Status: DC

## 2022-02-18 DIAGNOSIS — M25569 Pain in unspecified knee: Secondary | ICD-10-CM | POA: Diagnosis not present

## 2022-02-18 DIAGNOSIS — Z79891 Long term (current) use of opiate analgesic: Secondary | ICD-10-CM | POA: Diagnosis not present

## 2022-02-18 DIAGNOSIS — G8929 Other chronic pain: Secondary | ICD-10-CM | POA: Diagnosis not present

## 2022-02-18 DIAGNOSIS — G8912 Acute post-thoracotomy pain: Secondary | ICD-10-CM | POA: Diagnosis not present

## 2022-02-18 DIAGNOSIS — M545 Low back pain, unspecified: Secondary | ICD-10-CM | POA: Diagnosis not present

## 2022-02-18 DIAGNOSIS — G4700 Insomnia, unspecified: Secondary | ICD-10-CM | POA: Diagnosis not present

## 2022-02-18 DIAGNOSIS — F419 Anxiety disorder, unspecified: Secondary | ICD-10-CM | POA: Diagnosis not present

## 2022-02-19 ENCOUNTER — Ambulatory Visit (HOSPITAL_COMMUNITY): Admission: RE | Admit: 2022-02-19 | Payer: PPO | Source: Ambulatory Visit

## 2022-03-01 ENCOUNTER — Encounter: Payer: Self-pay | Admitting: Family Medicine

## 2022-03-01 ENCOUNTER — Ambulatory Visit (INDEPENDENT_AMBULATORY_CARE_PROVIDER_SITE_OTHER): Payer: PPO

## 2022-03-01 ENCOUNTER — Ambulatory Visit (INDEPENDENT_AMBULATORY_CARE_PROVIDER_SITE_OTHER): Payer: PPO | Admitting: Family Medicine

## 2022-03-01 VITALS — BP 90/55 | HR 51 | Temp 96.4°F | Ht 65.5 in | Wt 211.8 lb

## 2022-03-01 DIAGNOSIS — M1711 Unilateral primary osteoarthritis, right knee: Secondary | ICD-10-CM

## 2022-03-01 DIAGNOSIS — W19XXXA Unspecified fall, initial encounter: Secondary | ICD-10-CM | POA: Diagnosis not present

## 2022-03-01 DIAGNOSIS — S0992XA Unspecified injury of nose, initial encounter: Secondary | ICD-10-CM | POA: Diagnosis not present

## 2022-03-01 DIAGNOSIS — I472 Ventricular tachycardia, unspecified: Secondary | ICD-10-CM

## 2022-03-01 LAB — CUP PACEART REMOTE DEVICE CHECK
Battery Remaining Longevity: 108 mo
Battery Remaining Percentage: 100 %
Brady Statistic RA Percent Paced: 10 %
Brady Statistic RV Percent Paced: 3 %
Date Time Interrogation Session: 20230303070100
HighPow Impedance: 55 Ohm
Implantable Lead Implant Date: 20081119
Implantable Lead Implant Date: 20081119
Implantable Lead Location: 753859
Implantable Lead Location: 753860
Implantable Lead Model: 185
Implantable Lead Model: 4136
Implantable Lead Serial Number: 211124
Implantable Lead Serial Number: 28383796
Implantable Pulse Generator Implant Date: 20170303
Lead Channel Impedance Value: 558 Ohm
Lead Channel Impedance Value: 644 Ohm
Lead Channel Pacing Threshold Amplitude: 0.6 V
Lead Channel Pacing Threshold Amplitude: 1.2 V
Lead Channel Pacing Threshold Pulse Width: 0.5 ms
Lead Channel Pacing Threshold Pulse Width: 0.5 ms
Lead Channel Setting Pacing Amplitude: 2 V
Lead Channel Setting Pacing Amplitude: 2.4 V
Lead Channel Setting Pacing Pulse Width: 0.5 ms
Lead Channel Setting Sensing Sensitivity: 0.6 mV
Pulse Gen Serial Number: 204398

## 2022-03-01 NOTE — Progress Notes (Signed)
? ?Assessment & Plan:  ?1. Primary osteoarthritis of right knee ?Patient has previously got good relief with steroid injections of the knee so she was given one today. It has been 8 months since her last one. ?- Joint Injection/Arthrocentesis ? ?2-3. Fall, initial encounter/Injury of nose, initial encounter ?Encouraged to apply ice to her nose to help with swelling. Luckily she was off her warfarin at the time of her fall and did not have any bleeding/bruising.  ? ? ?Follow up plan: Return in about 3 months (around 06/01/2022) for follow-up of chronic medication conditions. ? ?[name] Boston, MSN, APRN, FNP-C ?[facility] ? ?Subjective:  ? ?Patient ID: [name] [name], female    DOB: 1961-05-18, 61 y.o.   MRN: [id number] ? ?HPI: ?[name] [name] is a 61 y.o. female presenting on 03/01/2022 for knee injection (right) and Facial Injury (Patient had a fall yesterday and would like her nose looked at. ) ? ?Patient reports her right knee has been bothering her more recently.  Her last steroid injection was 8 months ago.  Her knee x-ray from 2020 showed tricompartmental degenerative joint disease. ? ?She also reports the fell yesterday and hit her nose on a metal bottle cap. She had been off her warfarin for two days due to a dental procedure.  ? ? ?ROS: Negative unless specifically indicated above in HPI.  ? ?Relevant past medical history reviewed and updated as indicated.  ? ?Allergies and medications reviewed and updated. ? ? ?Current Outpatient Medications:  ?  ALPRAZolam (XANAX) 0.25 MG tablet, Take 1 tablet (0.25 mg total) by mouth daily as needed for anxiety., Disp: 15 tablet, Rfl: 2 ?  amiodarone (PACERONE) 200 MG tablet, Take Amiodarone 1 1/2 Tablet Daily Monday -Thursday. Take 2 Tablets Daily on Friday - Sunday, Disp: 180 tablet, Rfl: 3 ?  carvedilol (COREG) 12.5 MG tablet, TAKE 1 TABLET BY MOUTH TWICE DAILY, Disp: 120 tablet, Rfl: 3 ?  DULoxetine (CYMBALTA) 60 MG capsule, Take 60 mg by mouth  daily., Disp: , Rfl:  ?  empagliflozin (JARDIANCE) 10 MG TABS tablet, Take 1 tablet (10 mg total) by mouth daily before breakfast., Disp: 90 tablet, Rfl: 3 ?  EPINEPHrine 0.3 mg/0.3 mL IJ SOAJ injection, Inject 0.3 mg into the muscle as needed., Disp: , Rfl:  ?  ezetimibe (ZETIA) 10 MG tablet, TAKE 1 TABLET BY MOUTH EVERY DAY, Disp: 30 tablet, Rfl: 11 ?  isosorbide mononitrate (IMDUR) 30 MG 24 hr tablet, Take 1 tablet (30 mg total) by mouth daily., Disp: 90 tablet, Rfl: 3 ?  methocarbamol (ROBAXIN) 750 MG tablet, Take 1 tablet (750 mg total) by mouth every 8 (eight) hours as needed for muscle spasms., Disp: 45 tablet, Rfl: 0 ?  mexiletine (MEXITIL) 150 MG capsule, Take 2 capsules (300 mg total) by mouth 3 (three) times daily., Disp: 540 capsule, Rfl: 3 ?  Multiple Vitamins-Minerals (MULTIVITAMINS THER. W/MINERALS) TABS, Take 1 tablet by mouth daily.  , Disp: , Rfl:  ?  nitroGLYCERIN (NITROSTAT) 0.4 MG SL tablet, PLACE 1 TABLET UNDER THE TONGUE EVERY 5 MINUTES FOR 3 DOSES AS NEEDED CHEST PAIN, Disp: 25 tablet, Rfl: 3 ?  oxyCODONE-acetaminophen (PERCOCET) 10-325 MG tablet, Take 1 tablet by mouth every 4 (four) hours as needed for pain., Disp: , Rfl:  ?  pantoprazole (PROTONIX) 40 MG tablet, Take 1 tablet (40 mg total) by mouth daily., Disp: 30 tablet, Rfl: 11 ?  potassium chloride SA (KLOR-CON) 20 MEQ tablet, Take 2 tablets (40 mEq total)  by mouth every morning AND 1 tablet (20 mEq total) every evening., Disp: 270 tablet, Rfl: 3 ?  sacubitril-valsartan (ENTRESTO) 49-51 MG, Take 1 tablet by mouth 2 (two) times daily., Disp: 28 tablet, Rfl: 0 ?  sennosides-docusate sodium (SENOKOT-S) 8.6-50 MG tablet, Take 1 tablet by mouth daily as needed., Disp: , Rfl:  ?  spironolactone (ALDACTONE) 25 MG tablet, TAKE 1 TABLET BY MOUTH EVERY DAY, Disp: 30 tablet, Rfl: 11 ?  torsemide (DEMADEX) 20 MG tablet, Take 4 tablets (80 mg total) by mouth daily. Take 1 extra tablet as needed, Disp: 360 tablet, Rfl: 3 ?  traZODone (DESYREL) 100  MG tablet, Take 100 mg by mouth at bedtime as needed for sleep., Disp: , Rfl:  ?  warfarin (COUMADIN) 5 MG tablet, Take 1 tablet (5 mg total) by mouth daily at 4 PM. (Patient taking differently: Take 5 mg by mouth daily at 4 PM. Patient takes 5 mg tablet on Sunday Tuesday Thursday and Saturdays and takes 1/2  tablet on Monday Wednesday and Friday.), Disp: 30 tablet, Rfl: 6 ? ?Allergies  ?Allergen Reactions  ? Beef-Derived Products Anaphylaxis  ?  From tick bite  ? Metoclopramide Hcl Anaphylaxis  ?  Non responsive  ? Penicillins Anaphylaxis  ?  Shock ?Has patient had a PCN reaction causing immediate rash, facial/tongue/throat swelling, SOB or lightheadedness with hypotension: Yes ?Has patient had a PCN reaction causing severe rash involving mucus membranes or skin necrosis: No ?Has patient had a PCN reaction that required hospitalization Yes ?Has patient had a PCN reaction occurring within the last 10 years: No ?If all of the above answers are "NO", then may proceed with Cephalosporin   ? Pork-Derived Products Anaphylaxis  ?  From tick bite. Has tolerated heparin/lovenox  ? Metoprolol Other (See Comments)  ?  Syncope ?  ? Reglan [Metoclopramide]   ? Statins Other (See Comments)  ?  Myalgias  ? ? ?Objective:  ? ?BP (!) 90/55   Pulse (!) 51   Temp (!) 96.4 ?F (35.8 ?C) (Temporal)   Ht 5' 5.5" (1.664 m)   Wt 211 lb 12.8 oz (96.1 kg)   BMI 34.71 kg/m?   ? ?Physical Exam ?Vitals reviewed.  ?Constitutional:   ?   General: She is not in acute distress. ?   Appearance: Normal appearance. She is obese. She is not ill-appearing, toxic-appearing or diaphoretic.  ?HENT:  ?   Head: Normocephalic and atraumatic.  ?   Nose: Signs of injury (swelling with a small scab on the bridge of her nose) present.  ?Eyes:  ?   General: No scleral icterus.    ?   Right eye: No discharge.     ?   Left eye: No discharge.  ?   Conjunctiva/sclera: Conjunctivae normal.  ?Cardiovascular:  ?   Rate and Rhythm: Normal rate.  ?Pulmonary:  ?    Effort: Pulmonary effort is normal. No respiratory distress.  ?Musculoskeletal:     ?   General: Normal range of motion.  ?   Cervical back: Normal range of motion.  ?   Right knee: No erythema. Tenderness present over the medial joint line.  ?Skin: ?   General: Skin is warm and dry.  ?   Capillary Refill: Capillary refill takes less than 2 seconds.  ?Neurological:  ?   General: No focal deficit present.  ?   Mental Status: She is alert and oriented to person, place, and time. Mental status is at baseline.  ?Psychiatric:     ?  Mood and Affect: Mood normal.     ?   Behavior: Behavior normal.     ?   Thought Content: Thought content normal.     ?   Judgment: Judgment normal.  ? ?Joint Injection/Arthrocentesis ? ?Date/Time: 03/01/2022 8:38 AM ?Performed by: [name], FNP ?Authorized by: [name], FNP  ?Indications: pain  ?Body area: knee ?Joint: right knee ?Local anesthesia used: yes ? ?Anesthesia: ?Local anesthesia used: yes ?Local Anesthetic: topical anesthetic ? ?Sedation: ?Patient sedated: no ? ?Preparation: Patient was prepped and draped in the usual sterile fashion. ?Needle gauge: 25 G. ?Ultrasound guidance: no ?Approach: medial ?Aspirate amount: 0 mL ?Methylprednisolone amount: 40 mg ?Lidocaine 2% amount: 3 mL ?Patient tolerance: patient tolerated the procedure well with no immediate complications ? ? ? ? ? ? ?

## 2022-03-04 ENCOUNTER — Telehealth (HOSPITAL_COMMUNITY): Payer: Self-pay | Admitting: Pharmacy Technician

## 2022-03-04 ENCOUNTER — Encounter: Payer: Self-pay | Admitting: Cardiology

## 2022-03-04 NOTE — Telephone Encounter (Signed)
Advanced Heart Failure Patient Advocate Encounter ? ?Patient called and left message regarding Jardiance and assistance. I called and left her a message referencing the note made from 01/30. Also sent her a copy of the grant information. Advised her to call back with issues. ? ?Archer Asa, CPhT ? ?

## 2022-03-05 ENCOUNTER — Telehealth: Payer: PPO | Admitting: Physician Assistant

## 2022-03-05 ENCOUNTER — Encounter (HOSPITAL_COMMUNITY): Payer: Self-pay

## 2022-03-05 ENCOUNTER — Encounter: Payer: Self-pay | Admitting: Family Medicine

## 2022-03-05 DIAGNOSIS — J019 Acute sinusitis, unspecified: Secondary | ICD-10-CM

## 2022-03-05 DIAGNOSIS — B9689 Other specified bacterial agents as the cause of diseases classified elsewhere: Secondary | ICD-10-CM

## 2022-03-05 MED ORDER — CLINDAMYCIN HCL 300 MG PO CAPS: 300.0000 mg | ORAL_CAPSULE | Freq: Three times a day (TID) | ORAL | 0 refills | Status: DC

## 2022-03-05 NOTE — Progress Notes (Signed)
E-Visit for Sinus Problems ? ?We are sorry that you are not feeling well.  Here is how we plan to help! ? ?Based on what you have shared with me it looks like you have sinusitis.  Sinusitis is inflammation and infection in the sinus cavities of the head.  Based on your presentation I believe you most likely have Acute Bacterial Sinusitis.  This is an infection caused by bacteria and is treated with antibiotics. I have prescribed  Clindamycin 300mg  Three times daily for 10 days. You may use an oral decongestant such as Mucinex D or if you have glaucoma or high blood pressure use plain Mucinex. Saline nasal spray help and can safely be used as often as needed for congestion.  If you develop worsening sinus pain, fever or notice severe headache and vision changes, or if symptoms are not better after completion of antibiotic, please schedule an appointment with a health care provider.   ? ?Sinus infections are not as easily transmitted as other respiratory infection, however we still recommend that you avoid close contact with loved ones, especially the very young and elderly.  Remember to wash your hands thoroughly throughout the day as this is the number one way to prevent the spread of infection! ? ?Home Care: ?Only take medications as instructed by your medical team. ?Complete the entire course of an antibiotic. ?Do not take these medications with alcohol. ?A steam or ultrasonic humidifier can help congestion.  You can place a towel over your head and breathe in the steam from hot water coming from a faucet. ?Avoid close contacts especially the very young and the elderly. ?Cover your mouth when you cough or sneeze. ?Always remember to wash your hands. ? ?Get Help Right Away If: ?You develop worsening fever or sinus pain. ?You develop a severe head ache or visual changes. ?Your symptoms persist after you have completed your treatment plan. ? ?Make sure you ?Understand these instructions. ?Will watch your  condition. ?Will get help right away if you are not doing well or get worse. ? ?Thank you for choosing an e-visit. ? ?Your e-visit answers were reviewed by a board certified advanced clinical practitioner to complete your personal care plan. Depending upon the condition, your plan could have included both over the counter or prescription medications. ? ?Please review your pharmacy choice. Make sure the pharmacy is open so you can pick up prescription now. If there is a problem, you may contact your provider through and have the prescription routed to another pharmacy.  Your safety is important to [employer]. If you have drug allergies check your prescription carefully.  ? ?For the next 24 hours you can use MyChart to ask questions about today's visit, request a non-urgent call back, or ask for a work or school excuse. ?You will get an email in the next two days asking about your experience. I hope that your e-visit has been valuable and will speed your recovery. ? ?I provided 5 minutes of non face-to-face time during this encounter for chart review and documentation.  ? ?

## 2022-03-07 ENCOUNTER — Ambulatory Visit (HOSPITAL_COMMUNITY): Payer: PPO

## 2022-03-11 NOTE — Progress Notes (Signed)
Remote ICD transmission.   

## 2022-03-12 ENCOUNTER — Ambulatory Visit (INDEPENDENT_AMBULATORY_CARE_PROVIDER_SITE_OTHER): Payer: PPO | Admitting: Pharmacist

## 2022-03-12 ENCOUNTER — Other Ambulatory Visit: Payer: Self-pay

## 2022-03-12 ENCOUNTER — Ambulatory Visit: Payer: PPO | Admitting: Pharmacist

## 2022-03-12 DIAGNOSIS — D6859 Other primary thrombophilia: Secondary | ICD-10-CM

## 2022-03-12 DIAGNOSIS — I679 Cerebrovascular disease, unspecified: Secondary | ICD-10-CM

## 2022-03-12 DIAGNOSIS — E785 Hyperlipidemia, unspecified: Secondary | ICD-10-CM | POA: Diagnosis not present

## 2022-03-12 DIAGNOSIS — G720 Drug-induced myopathy: Secondary | ICD-10-CM | POA: Diagnosis not present

## 2022-03-12 DIAGNOSIS — T466X5A Adverse effect of antihyperlipidemic and antiarteriosclerotic drugs, initial encounter: Secondary | ICD-10-CM | POA: Diagnosis not present

## 2022-03-12 DIAGNOSIS — Z5181 Encounter for therapeutic drug level monitoring: Secondary | ICD-10-CM

## 2022-03-12 DIAGNOSIS — I63549 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: Secondary | ICD-10-CM

## 2022-03-12 DIAGNOSIS — D6861 Antiphospholipid syndrome: Secondary | ICD-10-CM

## 2022-03-12 DIAGNOSIS — I4819 Other persistent atrial fibrillation: Secondary | ICD-10-CM

## 2022-03-12 LAB — POCT INR: INR: 4.6 — AB (ref 2.0–3.0)

## 2022-03-12 MED ORDER — PRAVASTATIN SODIUM 20 MG PO TABS: 20.0000 mg | ORAL_TABLET | Freq: Every evening | ORAL | 11 refills | Status: DC

## 2022-03-12 NOTE — Progress Notes (Signed)
Patient ID: [name] [name]                 DOB: Aug 06, 1961                    MRN: [id number] ? ? ? ? ?HPI: ?[name] [name] is a 61 y.o. female patient referred to lipid clinic by Dr Shirlee Latch. PMH is significant for CVA, antiphospholipid syndrome, CAD s/p MI in 2008 with total occlusion of her diagonal and LCx, echo with EF 15-20% in 07/2020 s/p ICD, now up to 30-35%, tobacco abuse, HTN, bilateral ICA stenosis (< 50% LICA, occluded RCA), PAD, afib, and HLD. ? ?Pt presents today for follow up. Reports experiencing muscle pain within 2-3 weeks of starting statins in the past. Does have pain and balance issues at baseline. Has anaphylaxis to pork and beef-derived products due to prior [place] tick bite. Has tolerated heparin and Lovenox in the past. Receives her Sherryll Burger and Jardiance from patient assistance programs. ? ?Missed her last INR check in [place], add pt on today for INR check. ? ?Current Medications: ezetimibe 10mg  daily ?Intolerances: atorvastatin 10mg  daily and every other day, rosuvastatin 10mg  2x/week - myalgias ?Risk Factors: CVA, premature CAD - MI in her 43s, PAD, tobacco abuse, FHx premature CAD ?LDL goal: 55mg /dL ? ?Diet: Chicken, , or fish. No red meat (alpha gal allergy). Avoiding fried food. Doesn't drive so doesn't eat out much. Trying to eat slower and smaller meals. ? ?Exercise: balance issues walking. ? ?Family History: Father and grandfather with MIs. Brother with MI in his 88s. ? ?Social History: Remote cocaine abuse. ? ?Labs: ?01/28/22: TC 164, TG 114, HDL 56, LDL 85 (ezetimibe 10mg  daily) ? ?Past Medical History:  ?Diagnosis Date  ? Aneurysm of internal carotid artery   ? Anti-phospholipid syndrome (HCC)   ? Anticardiolipin antibody positive   ? Chronic Coumadin  ? Brain aneurysm   ? followed by Dr. Malawi  ? Chronic combined systolic (congestive) and diastolic (congestive) heart failure (HCC)   ? Cocaine abuse in remission [facility])   ? Coronary atherosclerosis of native coronary artery    ? Previous invasive cardiac testing was done at a facility in [place], .  Occluded diagonal, Diffuse LAD disease, Occluded Cx, and non obstructive RCA.   ? Essential hypertension   ? Headache   ? History of pneumonia   ? ICD (implantable cardioverter-defibrillator) in place   ? Ischemic cardiomyopathy   ? LVEF 30-35%  ? Mixed hyperlipidemia   ? NSVT (nonsustained ventricular tachycardia)   ? Persistent atrial fibrillation (HCC)   ? PVD (peripheral vascular disease) (HCC)   ? Raynaud's phenomenon   ? Statin intolerance   ? Stroke [facility]) 2007  ? 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 ? ?Current Outpatient Medications on File Prior to Visit  ?Medication Sig Dispense Refill  ? ALPRAZolam (XANAX) 0.25 MG tablet Take 1 tablet (0.25 mg total) by mouth daily as needed for anxiety. 15 tablet 2  ? amiodarone (PACERONE) 200 MG tablet Take Amiodarone 1 1/2 Tablet Daily Monday -Thursday. Take 2 Tablets Daily on Friday - Sunday 180 tablet 3  ? carvedilol (COREG) 12.5 MG tablet TAKE 1 TABLET BY MOUTH TWICE DAILY 120 tablet 3  ? clindamycin (CLEOCIN) 300 MG capsule Take 1 capsule (300 mg total) by mouth 3 (three) times daily. 30 capsule 0  ? DULoxetine (CYMBALTA) 60 MG capsule Take 60 mg by mouth daily.    ? empagliflozin (JARDIANCE) 10 MG  TABS tablet Take 1 tablet (10 mg total) by mouth daily before breakfast. 90 tablet 3  ? EPINEPHrine 0.3 mg/0.3 mL IJ SOAJ injection Inject 0.3 mg into the muscle as needed.    ? ezetimibe (ZETIA) 10 MG tablet TAKE 1 TABLET BY MOUTH EVERY DAY 30 tablet 11  ? isosorbide mononitrate (IMDUR) 30 MG 24 hr tablet Take 1 tablet (30 mg total) by mouth daily. 90 tablet 3  ? methocarbamol (ROBAXIN) 750 MG tablet Take 1 tablet (750 mg total) by mouth every 8 (eight) hours as needed for muscle spasms. 45 tablet 0  ? mexiletine (MEXITIL) 150 MG capsule Take 2 capsules (300 mg total) by mouth 3 (three) times daily. 540 capsule 3  ? Multiple Vitamins-Minerals (MULTIVITAMINS THER. W/MINERALS)  TABS Take 1 tablet by mouth daily.      ? nitroGLYCERIN (NITROSTAT) 0.4 MG SL tablet PLACE 1 TABLET UNDER THE TONGUE EVERY 5 MINUTES FOR 3 DOSES AS NEEDED CHEST PAIN 25 tablet 3  ? oxyCODONE-acetaminophen (PERCOCET) 10-325 MG tablet Take 1 tablet by mouth every 4 (four) hours as needed for pain.    ? pantoprazole (PROTONIX) 40 MG tablet Take 1 tablet (40 mg total) by mouth daily. 30 tablet 11  ? potassium chloride SA (KLOR-CON) 20 MEQ tablet Take 2 tablets (40 mEq total) by mouth every morning AND 1 tablet (20 mEq total) every evening. 270 tablet 3  ? sacubitril-valsartan (ENTRESTO) 49-51 MG Take 1 tablet by mouth 2 (two) times daily. 28 tablet 0  ? sennosides-docusate sodium (SENOKOT-S) 8.6-50 MG tablet Take 1 tablet by mouth daily as needed.    ? spironolactone (ALDACTONE) 25 MG tablet TAKE 1 TABLET BY MOUTH EVERY DAY 30 tablet 11  ? torsemide (DEMADEX) 20 MG tablet Take 4 tablets (80 mg total) by mouth daily. Take 1 extra tablet as needed 360 tablet 3  ? traZODone (DESYREL) 100 MG tablet Take 100 mg by mouth at bedtime as needed for sleep.    ? warfarin (COUMADIN) 5 MG tablet Take 1 tablet (5 mg total) by mouth daily at 4 PM. (Patient taking differently: Take 5 mg by mouth daily at 4 PM. Patient takes 5 mg tablet on Sunday Tuesday Thursday and Saturdays and takes 1/2  tablet on Monday Wednesday and Friday.) 30 tablet 6  ? ?No current facility-administered medications on file prior to visit.  ? ? ?Allergies  ?Allergen Reactions  ? Beef-Derived Products Anaphylaxis  ?  From tick bite  ? Metoclopramide Hcl Anaphylaxis  ?  Non responsive  ? Penicillins Anaphylaxis  ?  Shock ?Has patient had a PCN reaction causing immediate rash, facial/tongue/throat swelling, SOB or lightheadedness with hypotension: Yes ?Has patient had a PCN reaction causing severe rash involving mucus membranes or skin necrosis: No ?Has patient had a PCN reaction that required hospitalization Yes ?Has patient had a PCN reaction occurring within the  last 10 years: No ?If all of the above answers are "NO", then may proceed with Cephalosporin   ? Pork-Derived Products Anaphylaxis  ?  From tick bite. Has tolerated heparin/lovenox  ? Metoprolol Other (See Comments)  ?  Syncope ?  ? Reglan [Metoclopramide]   ? Statins Other (See Comments)  ?  Myalgias  ? ? ?Assessment/Plan: ? ?1. Hyperlipidemia - LDL 85 on ezetimibe 10mg  daily above goal < 55. Pt intolerant to atorvastatin and rosuvastatin. Discussed trying low dose of pravastatin or PCSK9i. Pt prefers to try pravastatin. Will start at 20mg  daily and advised pt to decrease to every  other day if she experiences muscle pain on daily dosing. If she experiences pain on every other day dosing, she was advised to call me and can pursue Repatha at that time. Discussed that copay would be higher at $90/90 day supply. She'll continue on ezetimibe 10mg  daily. Sees PCP in ~2 months who can recheck lipids. ? ?2. Weight loss - Pt also mentions that weight loss med was discussed at her last CHF visit. She has [employer] insurance which does not cover Wegovy and only covers Ozempic for patients with DM which pt does not have. She plans to continue with dietary improvements and increase activity as able. ? ?3. Anticoag - Missed her last appt in [place]. INR checked today and dosed, see separate anticoag encounter. F/u made in [place] office in 2 weeks given supratherapeutic INR today. Reports she's having a single tooth extracted soon, advised she should continue her warfarin for this. ? ?Rico Massar E. Yehudit Fulginiti, PharmD, BCACP, CPP ?[facility] ?[address] [place], [place] [address] ?Phone: [phone]; Fax: [phone] ?03/12/2022 3:23 PM ? ? ? ?

## 2022-03-12 NOTE — Patient Instructions (Addendum)
It was nice to see you today! ? ?Your LDL is 85 and your goal is < 55 ? ?Start taking pravastatin 20mg  - 1 tablet daily. If you notice any muscle pain, you can cut back to 1 tablet every other day. If you still have pain on this, call Jinny Blossom, PharmD at [phone] and we can try the Repatha injections next ?

## 2022-03-12 NOTE — Patient Instructions (Signed)
Description   ?Skip your warfarin today and tomorrow, then start taking 1/2 tablet daily except 1 tablet on Mondays and Fridays ?Recheck in 2 wk ? ?  ? ? ?

## 2022-03-14 ENCOUNTER — Other Ambulatory Visit: Payer: Self-pay | Admitting: Cardiology

## 2022-03-14 ENCOUNTER — Other Ambulatory Visit (HOSPITAL_COMMUNITY): Payer: Self-pay | Admitting: Cardiology

## 2022-03-14 NOTE — Telephone Encounter (Signed)
Request for warfarin refill: ?Last OV 01/28/22  Golden Circle MD ?Last INR 4.6 on 03/12/22 ?Next INR on 03/26/22 ? ?Refill approved ?

## 2022-03-26 ENCOUNTER — Encounter (HOSPITAL_COMMUNITY): Payer: Self-pay

## 2022-03-27 NOTE — Progress Notes (Signed)
PCP: Loman Brooklyn, FNP ?Cardiology: Dr. Domenic Polite ?HF Cardiology: Dr. Aundra Dubin ? ?62 y.o. with history of CVA, anti-phospholipid antibody syndrome, CAD, ischemic cardiomyopathy, and persistent atrial fibrillation was referred by Dr. Domenic Polite for evaluation of CHF. Patient had a cath in 2008 in setting of an MI in [place].  Diagonal and LCx were totally occluded, there was diffuse LAD disease.  She has not had a cath since that time. She has a [place].  Echo in 8/21 showed EF 15-20%, anteroseptal akinesis, normal RV, severe LAE, mild MR. She was admitted in 8/21 with CHF exacerbation for 6 days.  She went into atrial fibrillation in 11/21.  ? ?In 12/21, she had TEE-guided DCCV back to NSR.  TEE showed EF 25-30%, mildly decreased RV systolic function, moderate MR.   ? ?Later in 12/21, she was admitted with syncope and VT with ICD shock.  RHC/LHC showed chronically occluded D1 and 95% proximal LCx; normal filling pressures with CI 3.0. Platelets were low, no intervention was done. Amiodarone was started.  ? ?In 1/22, she was admitted again with VT and presyncope, HS-TnI was negative.  She had PCI to LCx this admission.  She was started on mexiletine in addition to amiodarone.   She was readmitted later in 1/22 with chest pain, HS-TnI was negative and she was discharged.  ? ?She was admitted again in 3/22 with recurrent VT.  LHC/RHC showed totally occluded D1 (chronic) with patent LCx stent and filling pressures were normal with normal CO.  She had a VT ablation with Dr. Curt Bears.  ? ?She was admitted with VT and syncope in 6/22, troponin was negative.  She had VT treated with ATP in 10/22, mexiletine was increased.  ? ?Echo 1/23 EF 30-35%, LAD territory wall motion abnormalities, normal RV, IVC normal, moderate MR.  ? ?Last visit, Entresto was increased to 49-51 mg bid. Referred back to Lipid clinic as LDL was not at goal + statin intolerance. She was ordered to get repeat carotid dopplers and PFTs, but  studies not completed yet.   ? ?She presents today for f/u. Reports she had to reduced Entresto back down to 24-26 mg. Did not tolerate higher dose due to dizziness and near falls. This has improved on lower dose. Denies further dizziness. No falls but still w/ some balance issues which she attributes to amiodarone.  ? ?She endorses stable NYHA Class II symptoms. No chest pain. Denies palpitations. No ICD shocks. Device interrogation shows no VT. Fluid index good. Heart Logic score is 5.  ? ? ? ? ?Labs (8/21): K 4.4, creatinine 0.58, LDL 94  ?Labs (12/21): LDL 72, HDL 31, TGs 85, K 4.4, creatinine 0.8, hgb 14.6, plts 70K ?Labs (1/22): K 3.3, creatinine 0.94, plts 58K ?Labs (3/22): K 3.8, creatinine 0.99, hgb 11.9, plts 160 ?Labs (4/22): hgb 14.2, plts 152, digoxin 0.2, K 4.1, creatinine 1.0 ?Labs (10/22): LDL 104, AST 74, ALT 76, plts 114, K 4.3, creatinine 1.11 ?Labs (11/22): BNP 212, K 4, creatinine 1.03 ?Labs (1/22): Scr 1.17, K 4.2, TSH and HFTs normal, LDL 85, ESR 30  ? ?PMH: ?1. Brain aneurysm: Followed by Dr. Estanislado Pandy.  ?2. HTN ?3. CVA: Total occlusion of RICA.  ?- Carotid dopplers (12/21): Occluded RICA, < 11% stenosis LICA.  ?- Carotid dopplers (3/22): Occluded RICA, 5-72% LICA ?4. Anti-phospholipid antibody syndrome: Diagnosed at [facility].  ?- On warfarin and ASA 81 given history of CVA.  ?5. Remote cocaine abuse.  ?6. PAD: peripheral arterial dopplers in  3/21 with early left-sided iliac disease, right anterior tibial disease; ABI 1.0 on right and 0.75 on left.  ?- ABIs (11/22): 1.05 right, 0.9 left.  ?7. Active smoker.  ?8. L-spine arthritis.  ?9. CAD: MI in 2008 in [facility], cath at the time showed totally occluded diagonal, totally occluded LCX, and diffuse LAD disease.  No intervention.   ?- LHC (12/21): chronically occluded D1, 45% mid LAD, 95% proximal LCx.  ?- PCI to proximal LCx in 1/22.  ?- LHC (3/22): CTO D1, patent LCx stent.  ?10. Atrial fibrillation: Persistent, started in 11/21.  ?- TEE-DCCV  back to NSR in 12/21.  ?11. Chronic systolic CHF: Ischemic cardiomyopathy.  [place].  ?- Echo (8/21): EF 15-20%, anteroseptal AK, RV normal, severe LAE, mild MR.  ?- TEE (12/21): EF 25-30%, mildly decreased RV systolic function, moderate MR with ERO 0.26 cm^2 (probably functional).   ?- RHC (12/21): mean RA 1, PA 24/6, mean PCWP 12, CI 3.0 ?- RHC (3/22): mean RA 1, PA 19/4, mean PCWP 7, CI 2.69 ?- Echo (1/23): EF 30-35%, LAD territory wall motion abnormalities, normal RV, IVC normal, moderate MR.  ?12. Hyperlipidemia: Statin intolerant.  ?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   ?14. Mitral regurgitation: Moderate on 12/21 TEE.  ?15. Ventricular tachycardia: 3/22 VT ablation.  ? ?FH: Father, grandfather with MIs.  Brother with MI in his 37s.  ? ?SH: Lives in [place], on disability, smokes 4 cigs/day.  Prior cocaine.  No ETOH.  ? ?ROS: All systems reviewed and negative except as per HPI.  ? ?Current Outpatient Medications  ?Medication Sig Dispense Refill  ? ALPRAZolam (XANAX) 0.25 MG tablet Take 1 tablet (0.25 mg total) by mouth daily as needed for anxiety. 15 tablet 2  ? amiodarone (PACERONE) 200 MG tablet Take Amiodarone 1 1/2 Tablet Daily Monday -Thursday. Take 2 Tablets Daily on Friday - Sunday 180 tablet 3  ? carvedilol (COREG) 12.5 MG tablet TAKE 1 TABLET BY MOUTH TWICE DAILY 120 tablet 3  ? clindamycin (CLEOCIN) 300 MG capsule Take 1 capsule (300 mg total) by mouth 3 (three) times daily. 30 capsule 0  ? DULoxetine (CYMBALTA) 60 MG capsule Take 60 mg by mouth daily.    ? empagliflozin (JARDIANCE) 10 MG TABS tablet Take 1 tablet (10 mg total) by mouth daily before breakfast. 90 tablet 3  ? EPINEPHrine 0.3 mg/0.3 mL IJ SOAJ injection Inject 0.3 mg into the muscle as needed.    ? ezetimibe (ZETIA) 10 MG tablet TAKE 1 TABLET BY MOUTH EVERY DAY 30 tablet 11  ? isosorbide mononitrate (IMDUR) 30 MG 24 hr tablet Take 1 tablet (30 mg total) by mouth daily. 90 tablet 3  ? methocarbamol (ROBAXIN) 750 MG  tablet Take 1 tablet (750 mg total) by mouth every 8 (eight) hours as needed for muscle spasms. 45 tablet 0  ? mexiletine (MEXITIL) 150 MG capsule Take 2 capsules (300 mg total) by mouth 3 (three) times daily. 540 capsule 3  ? Multiple Vitamins-Minerals (MULTIVITAMINS THER. W/MINERALS) TABS Take 1 tablet by mouth daily.      ? nitroGLYCERIN (NITROSTAT) 0.4 MG SL tablet PLACE 1 TABLET UNDER THE TONGUE EVERY 5 MINUTES FOR 3 DOSES AS NEEDED CHEST PAIN 25 tablet 3  ? oxyCODONE-acetaminophen (PERCOCET) 10-325 MG tablet Take 1 tablet by mouth every 4 (four) hours as needed for pain.    ? pantoprazole (PROTONIX) 40 MG tablet TAKE 1 TABLET BY MOUTH DAILY 30 tablet 11  ? potassium chloride SA (KLOR-CON) 20 MEQ  tablet Take 2 tablets (40 mEq total) by mouth every morning AND 1 tablet (20 mEq total) every evening. 270 tablet 3  ? pravastatin (PRAVACHOL) 20 MG tablet Take 1 tablet (20 mg total) by mouth every evening. 30 tablet 11  ? sacubitril-valsartan (ENTRESTO) 49-51 MG Patient takes 1/2 tablet by mouth daily.    ? sennosides-docusate sodium (SENOKOT-S) 8.6-50 MG tablet Take 1 tablet by mouth daily as needed.    ? spironolactone (ALDACTONE) 25 MG tablet TAKE 1 TABLET BY MOUTH EVERY DAY 30 tablet 11  ? torsemide (DEMADEX) 20 MG tablet Take 4 tablets (80 mg total) by mouth daily. Take 1 extra tablet as needed 360 tablet 3  ? traZODone (DESYREL) 100 MG tablet Take 100 mg by mouth at bedtime as needed for sleep.    ? warfarin (COUMADIN) 5 MG tablet Take 1/2 - 1 tablet daily or as directed by Coumadin Clinic 30 tablet 6  ? ?No current facility-administered medications for this encounter.  ? ?BP 132/82   Pulse 62   Wt 95.7 kg (211 lb)   SpO2 97%   BMI 34.58 kg/m?  ?PHYSICAL EXAM: ?General:  Well appearing. No respiratory difficulty ?HEENT: normal ?Neck: supple. no JVD. Carotids 2+ bilat; no bruits. No lymphadenopathy or thyromegaly appreciated. ?Cor: PMI nondisplaced. Regular rate & rhythm. No rubs, gallops or murmurs. ?Lungs:  clear ?Abdomen: soft, nontender, nondistended. No hepatosplenomegaly. No bruits or masses. Good bowel sounds. ?Extremities: no cyanosis, clubbing, rash, edema ?Neuro: alert & oriented x 3, cranial nerves grossly in

## 2022-03-28 ENCOUNTER — Ambulatory Visit (INDEPENDENT_AMBULATORY_CARE_PROVIDER_SITE_OTHER): Payer: Self-pay | Admitting: *Deleted

## 2022-03-28 ENCOUNTER — Encounter (HOSPITAL_COMMUNITY): Payer: Self-pay

## 2022-03-28 ENCOUNTER — Ambulatory Visit (HOSPITAL_COMMUNITY)
Admission: RE | Admit: 2022-03-28 | Discharge: 2022-03-28 | Disposition: A | Discharge status: Home / Self Care | Payer: PPO | Source: Ambulatory Visit | Attending: Family Medicine | Admitting: Family Medicine

## 2022-03-28 VITALS — BP 132/82 | HR 62 | Wt 211.0 lb

## 2022-03-28 DIAGNOSIS — I252 Old myocardial infarction: Secondary | ICD-10-CM | POA: Diagnosis not present

## 2022-03-28 DIAGNOSIS — I5042 Chronic combined systolic (congestive) and diastolic (congestive) heart failure: Secondary | ICD-10-CM | POA: Insufficient documentation

## 2022-03-28 DIAGNOSIS — Z79899 Other long term (current) drug therapy: Secondary | ICD-10-CM | POA: Diagnosis not present

## 2022-03-28 DIAGNOSIS — Z7901 Long term (current) use of anticoagulants: Secondary | ICD-10-CM | POA: Diagnosis not present

## 2022-03-28 DIAGNOSIS — I110 Hypertensive heart disease with heart failure: Secondary | ICD-10-CM | POA: Insufficient documentation

## 2022-03-28 DIAGNOSIS — D6861 Antiphospholipid syndrome: Secondary | ICD-10-CM | POA: Insufficient documentation

## 2022-03-28 DIAGNOSIS — I340 Nonrheumatic mitral (valve) insufficiency: Secondary | ICD-10-CM | POA: Diagnosis not present

## 2022-03-28 DIAGNOSIS — Z955 Presence of coronary angioplasty implant and graft: Secondary | ICD-10-CM | POA: Diagnosis not present

## 2022-03-28 DIAGNOSIS — I4819 Other persistent atrial fibrillation: Secondary | ICD-10-CM | POA: Insufficient documentation

## 2022-03-28 DIAGNOSIS — Z09 Encounter for follow-up examination after completed treatment for conditions other than malignant neoplasm: Secondary | ICD-10-CM | POA: Insufficient documentation

## 2022-03-28 DIAGNOSIS — I255 Ischemic cardiomyopathy: Secondary | ICD-10-CM | POA: Insufficient documentation

## 2022-03-28 DIAGNOSIS — Z7984 Long term (current) use of oral hypoglycemic drugs: Secondary | ICD-10-CM | POA: Insufficient documentation

## 2022-03-28 DIAGNOSIS — D696 Thrombocytopenia, unspecified: Secondary | ICD-10-CM | POA: Diagnosis not present

## 2022-03-28 DIAGNOSIS — I2510 Atherosclerotic heart disease of native coronary artery without angina pectoris: Secondary | ICD-10-CM | POA: Diagnosis not present

## 2022-03-28 DIAGNOSIS — F17210 Nicotine dependence, cigarettes, uncomplicated: Secondary | ICD-10-CM | POA: Insufficient documentation

## 2022-03-28 DIAGNOSIS — Z8673 Personal history of transient ischemic attack (TIA), and cerebral infarction without residual deficits: Secondary | ICD-10-CM | POA: Diagnosis not present

## 2022-03-28 LAB — PROTIME-INR
INR: 1.7 — ABNORMAL HIGH (ref 0.8–1.2)
Prothrombin Time: 19.9 seconds — ABNORMAL HIGH (ref 11.4–15.2)

## 2022-03-28 LAB — BASIC METABOLIC PANEL
Anion gap: 9 (ref 5–15)
BUN: 13 mg/dL (ref 6–20)
CO2: 28 mmol/L (ref 22–32)
Calcium: 8.4 mg/dL — ABNORMAL LOW (ref 8.9–10.3)
Chloride: 99 mmol/L (ref 98–111)
Creatinine, Ser: 1.09 mg/dL — ABNORMAL HIGH (ref 0.44–1.00)
GFR, Estimated: 59 mL/min — ABNORMAL LOW (ref >60)
Glucose, Bld: 75 mg/dL (ref 70–99)
Potassium: 4 mmol/L (ref 3.5–5.1)
Sodium: 136 mmol/L (ref 135–145)

## 2022-03-28 NOTE — Patient Instructions (Signed)
It was great to see you today! ?No medication changes are needed at this time. ? ? ?Labs today ?We will only contact you if something comes back abnormal or we need to make some changes. ?Otherwise no news is good news! ? ?Your physician has requested that you have a carotid duplex. This test is an ultrasound of the carotid arteries in your neck. It looks at blood flow through these arteries that supply the brain with blood. Allow one hour for this exam. There are no restrictions or special instructions. ? ?Your physician has recommended that you have a pulmonary function test. Pulmonary Function Tests are a group of tests that measure how well air moves in and out of your lungs. ? ?Your physician wants you to follow-up in: 3-4 months with Dr Earlean Shawl will receive a reminder letter in the mail two months in advance. If you don't receive a letter, please call our office to schedule the follow-up appointment. ? ? ?Do the following things EVERYDAY: ?Weigh yourself in the morning before breakfast. Write it down and keep it in a log. ?Take your medicines as prescribed ?Eat low salt foods--Limit salt (sodium) to 2000 mg per day.  ?Stay as active as you can everyday ?Limit all fluids for the day to less than 2 liters ? ?At the [facility], you and your health needs are our priority. As part of our continuing mission to provide you with exceptional heart care, we have created designated Provider Care Teams. These Care Teams include your primary Cardiologist (physician) and Advanced Practice Providers (APPs- Physician Assistants and Nurse Practitioners) who all work together to provide you with the care you need, when you need it.  ? ?You may see any of the following providers on your designated Care Team at your next follow up: ?Dr [name] ?Dr [name] ?Tonye Becket, NP ?Robbie Lis, PA ?Jessica Milford,NP ?Anna Genre, PA ?Karle Plumber, PharmD ? ? ?Please be sure to bring in all your  medications bottles to every appointment.  ? ?

## 2022-03-28 NOTE — Progress Notes (Signed)
Student Intern completed SDOH screening with pt. No interventions needed at this time. ? ?Janee McFarlane, MSW student intern ?[facility] ?[place] ?[phone] ? ? ?

## 2022-03-28 NOTE — Patient Instructions (Signed)
Take warfarin 1 tablet tonight then resume 1/2 tablet daily except 1 tablet on Mondays and Fridays ?Recheck in 1 wk ?

## 2022-04-02 ENCOUNTER — Ambulatory Visit (INDEPENDENT_AMBULATORY_CARE_PROVIDER_SITE_OTHER): Payer: PPO | Admitting: *Deleted

## 2022-04-02 DIAGNOSIS — I63549 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: Secondary | ICD-10-CM | POA: Diagnosis not present

## 2022-04-02 DIAGNOSIS — D6861 Antiphospholipid syndrome: Secondary | ICD-10-CM | POA: Diagnosis not present

## 2022-04-02 DIAGNOSIS — D6859 Other primary thrombophilia: Secondary | ICD-10-CM

## 2022-04-02 DIAGNOSIS — Z5181 Encounter for therapeutic drug level monitoring: Secondary | ICD-10-CM

## 2022-04-02 LAB — POCT INR: INR: 3.1 — AB (ref 2.0–3.0)

## 2022-04-02 NOTE — Patient Instructions (Signed)
Continue warfarin 1/2 tablet daily except 1 tablet on Mondays and Thursdays Recheck in 3 wks 

## 2022-04-03 ENCOUNTER — Encounter: Payer: Self-pay | Admitting: Family Medicine

## 2022-04-04 MED ORDER — FLUTICASONE PROPIONATE 50 MCG/ACT NA SUSP: 2.0000 | Freq: Every day | NASAL | 6 refills | Status: DC

## 2022-04-08 ENCOUNTER — Encounter: Payer: Self-pay | Admitting: Internal Medicine

## 2022-04-14 IMAGING — US US CAROTID DUPLEX BILAT
1 series · 13 of 24 positions shown · non-contrast
Comparison: 10/07/2014 and CT a 07/28/2017

CLINICAL DATA: Right carotid artery occlusion.

EXAM:
BILATERAL CAROTID DUPLEX ULTRASOUND
TECHNIQUE: Gray scale imaging, color Doppler and duplex ultrasound were
performed of bilateral carotid and vertebral arteries in the neck.

[Series 1: us carotid bilateral · 13 of 73 slices shown]
[im 1/73]
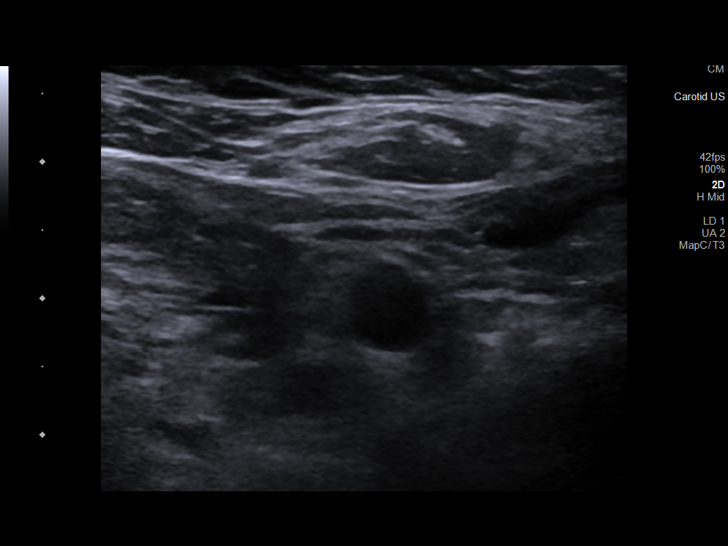
[im 7/73]
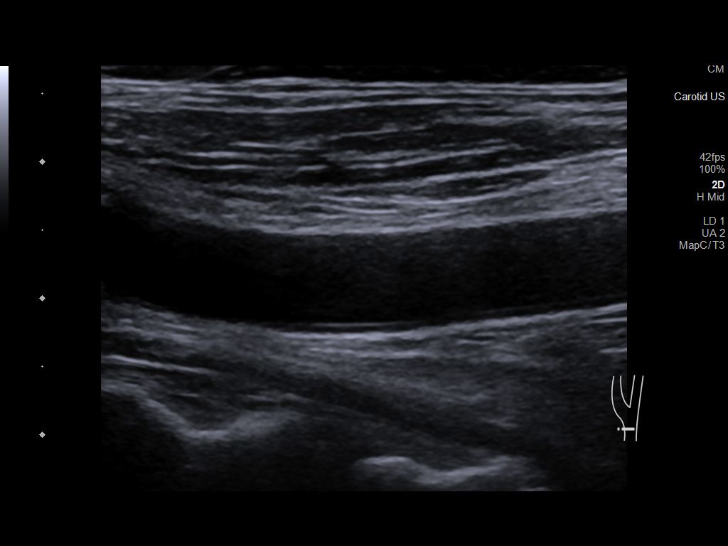
[im 13/73]
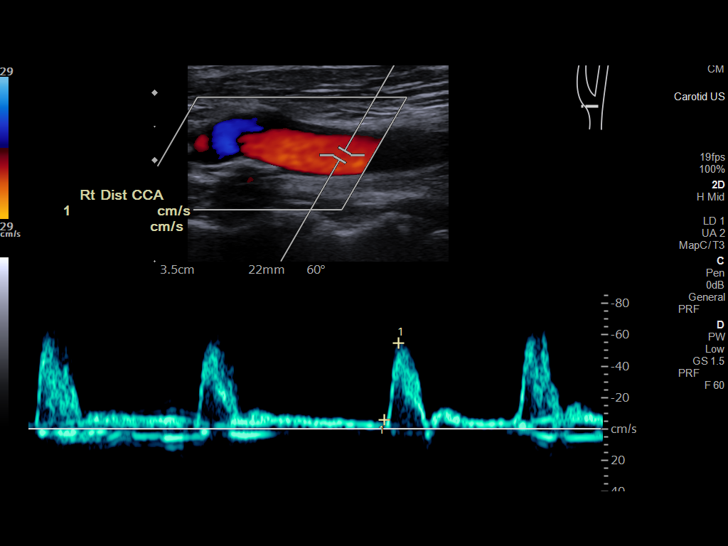
[im 19/73]
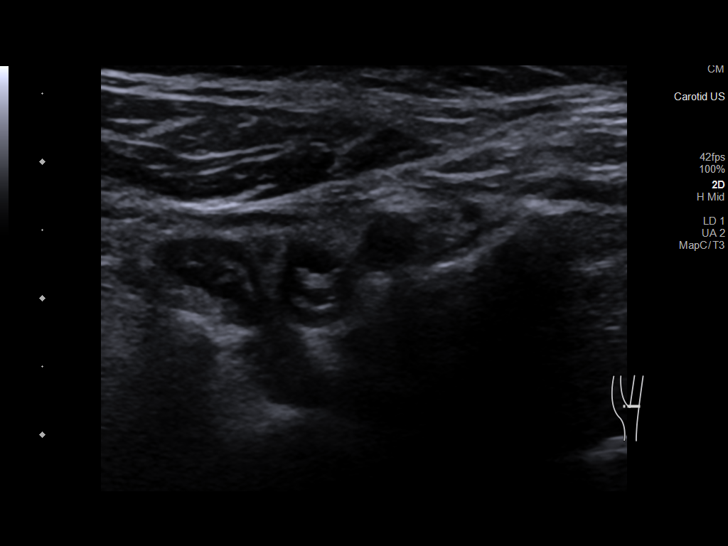
[im 26/73]
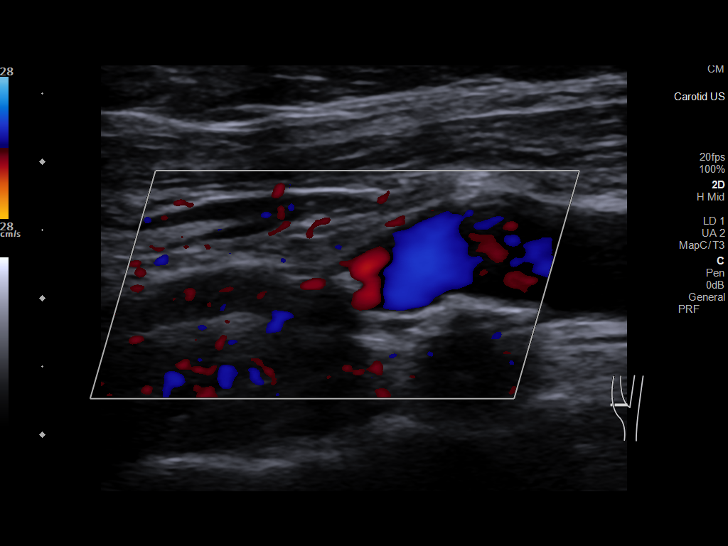
[im 32/73]
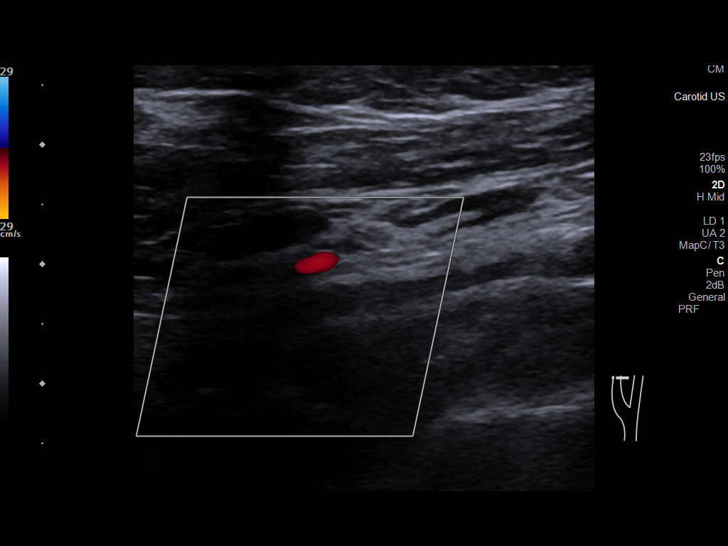
[im 38/73]
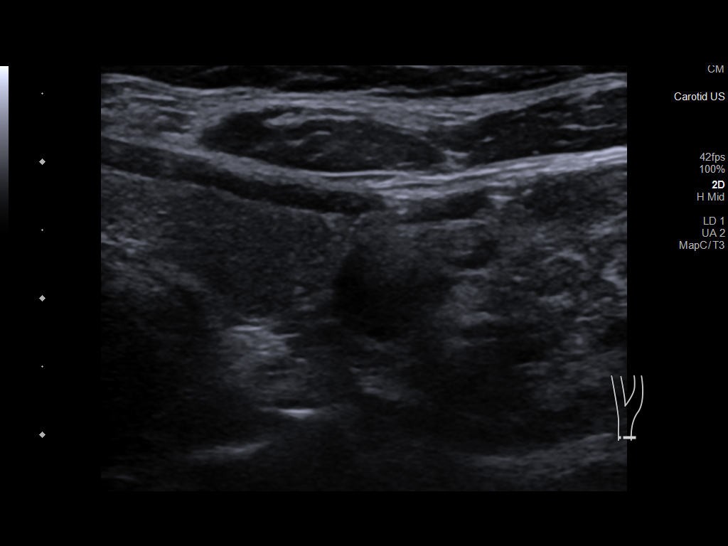
[im 41/73]
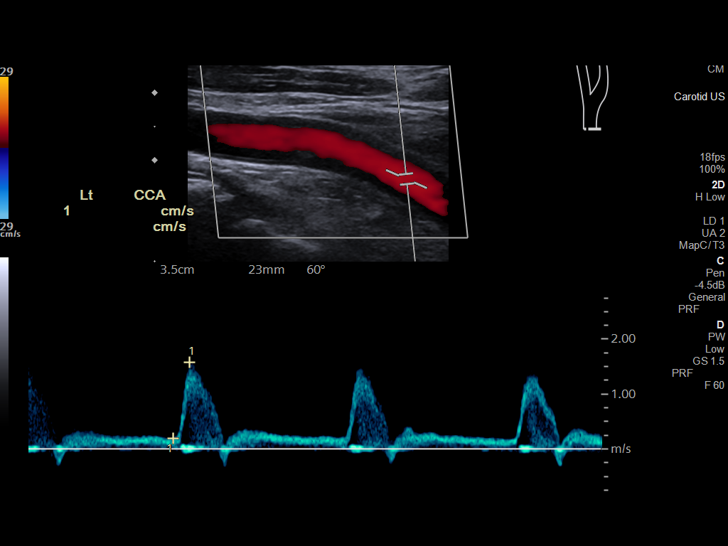
[im 47/73]
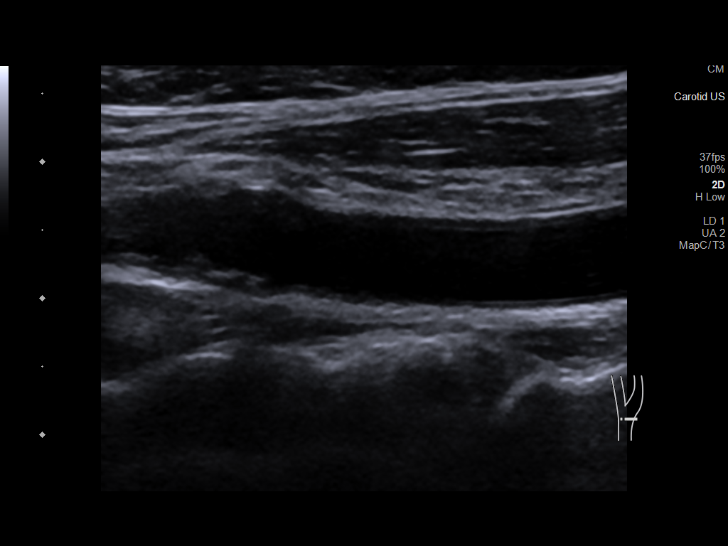
[im 54/73]
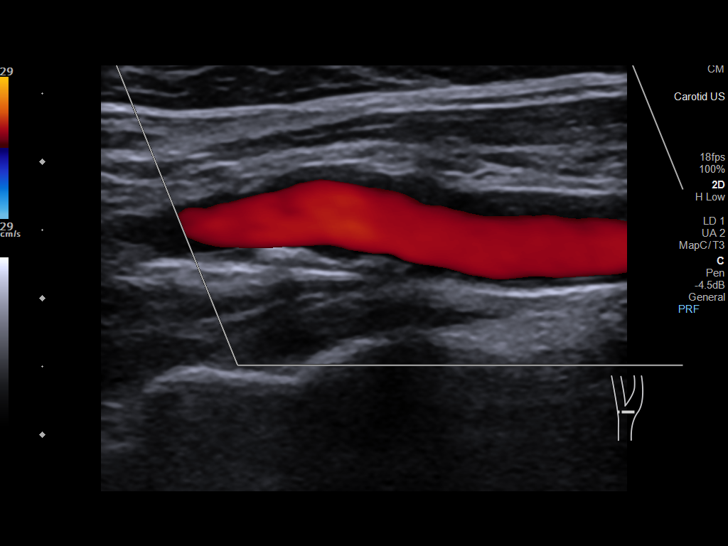
[im 60/73]
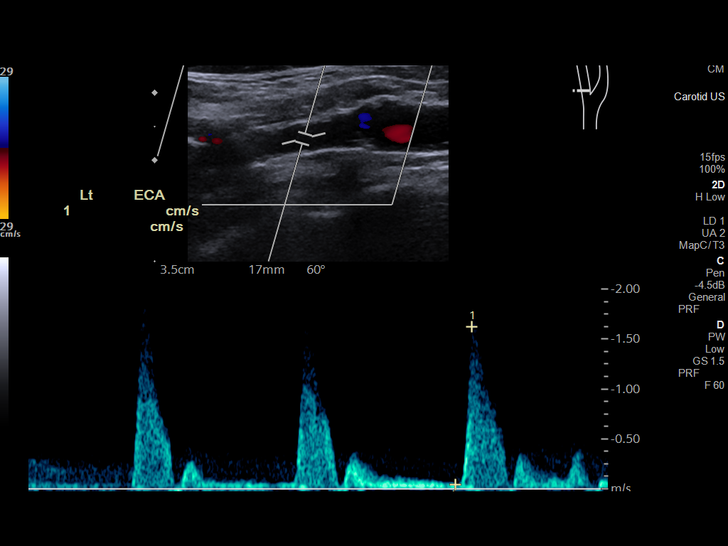
[im 66/73]
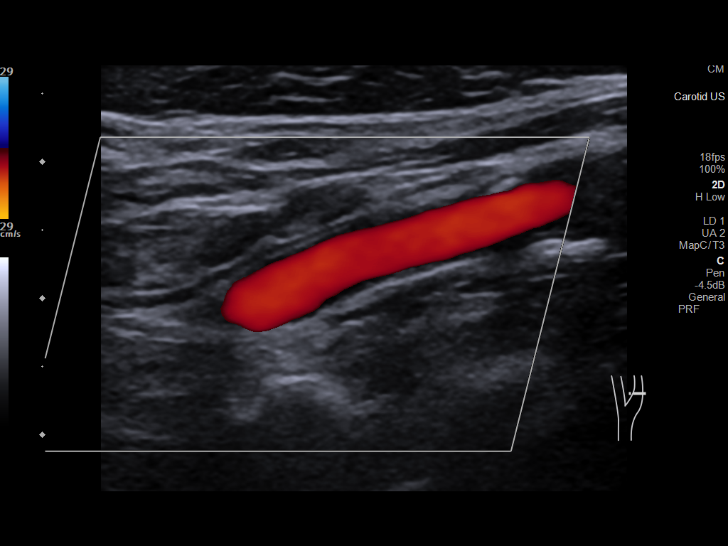
[im 73/73]
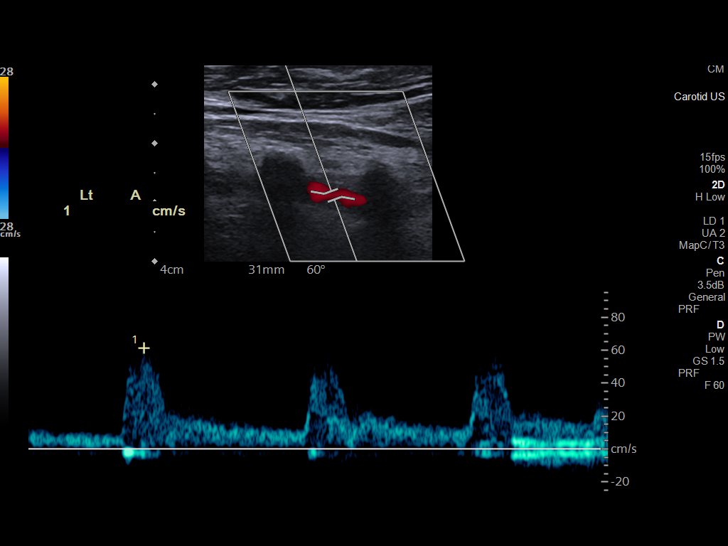

[13 of 24 positions shown; findings below may reference images not displayed]

FINDINGS: Criteria: Quantification of carotid stenosis is based on velocity
parameters that correlate the residual internal carotid diameter
with NASCET-based stenosis levels, using the diameter of the distal
internal carotid lumen as the denominator for stenosis measurement.

The following velocity measurements were obtained:

RIGHT

ICA: Occluded

CCA: 67/7 cm/sec

ECA: 290 cm/sec

LEFT

ICA: 128/25 cm/sec

CCA: 93/19 cm/sec

SYSTOLIC ICA/CCA RATIO:

ECA: 198 cm/sec

RIGHT CAROTID ARTERY: Echogenic plaque at the right carotid bulb.
External carotid artery is patent with normal waveform. Chronic
occlusion of the proximal internal carotid artery.

RIGHT VERTEBRAL ARTERY: Antegrade flow and normal waveform in the
right vertebral artery.

LEFT CAROTID ARTERY: Echogenic plaque at the left carotid bulb.
External carotid artery is patent with normal waveform. Small amount
of plaque in the proximal internal carotid artery. Normal waveforms
in the internal carotid artery.

LEFT VERTEBRAL ARTERY: Antegrade flow and normal waveform in the
left vertebral artery.
IMPRESSION: 1. Chronic occlusion of the right internal carotid artery.
2. Small amount of plaque in the left internal carotid artery. Peak
systolic velocity in the left internal artery has slightly elevated,
now measuring up to 128 cm/sec. Estimated degree of stenosis in the
left internal carotid artery is less than 50% based on the carotid
artery ratio but recommend continued surveillance.
3. Patent vertebral arteries with antegrade flow.
4. Bilateral external carotid arteries are patent. Again noted is an
elevated peak systolic velocity in the right external carotid
artery.

## 2022-04-16 ENCOUNTER — Encounter (HOSPITAL_COMMUNITY): Payer: PPO

## 2022-04-23 ENCOUNTER — Encounter: Payer: Self-pay | Admitting: Family Medicine

## 2022-04-23 ENCOUNTER — Ambulatory Visit (INDEPENDENT_AMBULATORY_CARE_PROVIDER_SITE_OTHER): Payer: PPO | Admitting: *Deleted

## 2022-04-23 DIAGNOSIS — D6861 Antiphospholipid syndrome: Secondary | ICD-10-CM | POA: Diagnosis not present

## 2022-04-23 DIAGNOSIS — I63549 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: Secondary | ICD-10-CM

## 2022-04-23 DIAGNOSIS — M1711 Unilateral primary osteoarthritis, right knee: Secondary | ICD-10-CM

## 2022-04-23 DIAGNOSIS — Z5181 Encounter for therapeutic drug level monitoring: Secondary | ICD-10-CM | POA: Diagnosis not present

## 2022-04-23 DIAGNOSIS — D6859 Other primary thrombophilia: Secondary | ICD-10-CM | POA: Diagnosis not present

## 2022-04-23 LAB — POCT INR: INR: 1.5 — AB (ref 2.0–3.0)

## 2022-04-23 NOTE — Patient Instructions (Signed)
Take warfarin 1 tablet tonight and tomorrow night then resume 1/2 tablet daily except 1 tablet on Mondays and Thursdays ?Recheck in 2 wks ?

## 2022-04-26 ENCOUNTER — Ambulatory Visit (HOSPITAL_COMMUNITY)
Admission: RE | Admit: 2022-04-26 | Discharge: 2022-04-26 | Disposition: A | Discharge status: Home / Self Care | Payer: PPO | Source: Ambulatory Visit | Attending: Cardiology | Admitting: Cardiology

## 2022-04-26 DIAGNOSIS — T462X5A Adverse effect of other antidysrhythmic drugs, initial encounter: Secondary | ICD-10-CM | POA: Diagnosis not present

## 2022-04-26 DIAGNOSIS — J984 Other disorders of lung: Secondary | ICD-10-CM | POA: Insufficient documentation

## 2022-04-26 LAB — PULMONARY FUNCTION TEST
DL/VA % pred: 74 %
DL/VA: 3.11 ml/min/mmHg/L
DLCO unc % pred: 71 %
DLCO unc: 15.17 ml/min/mmHg
FEF 25-75 Post: 3.14 L/sec
FEF 25-75 Pre: 2.45 L/sec
FEF2575-%Change-Post: 28 %
FEF2575-%Pred-Post: 126 %
FEF2575-%Pred-Pre: 98 %
FEV1-%Change-Post: 2 %
FEV1-%Pred-Post: 95 %
FEV1-%Pred-Pre: 92 %
FEV1-Post: 2.59 L
FEV1-Pre: 2.52 L
FEV1FVC-%Change-Post: 2 %
FEV1FVC-%Pred-Pre: 104 %
FEV6-%Change-Post: 0 %
FEV6-%Pred-Post: 90 %
FEV6-%Pred-Pre: 90 %
FEV6-Post: 3.08 L
FEV6-Pre: 3.07 L
FEV6FVC-%Pred-Post: 103 %
FEV6FVC-%Pred-Pre: 103 %
FVC-%Change-Post: 0 %
FVC-%Pred-Post: 87 %
FVC-%Pred-Pre: 87 %
FVC-Post: 3.08 L
FVC-Pre: 3.07 L
Post FEV1/FVC ratio: 84 %
Post FEV6/FVC ratio: 100 %
Pre FEV1/FVC ratio: 82 %
Pre FEV6/FVC Ratio: 100 %
RV % pred: 106 %
RV: 2.18 L
TLC % pred: 100 %
TLC: 5.28 L

## 2022-04-26 MED ORDER — ALBUTEROL SULFATE (2.5 MG/3ML) 0.083% IN NEBU
2.5000 mg | INHALATION_SOLUTION | Freq: Once | RESPIRATORY_TRACT | Status: AC
  Administered 2022-04-26: 2.5 mg via RESPIRATORY_TRACT

## 2022-04-30 ENCOUNTER — Encounter: Payer: Self-pay | Admitting: Orthopaedic Surgery

## 2022-04-30 ENCOUNTER — Ambulatory Visit (INDEPENDENT_AMBULATORY_CARE_PROVIDER_SITE_OTHER): Payer: PPO | Admitting: Orthopaedic Surgery

## 2022-04-30 ENCOUNTER — Ambulatory Visit (INDEPENDENT_AMBULATORY_CARE_PROVIDER_SITE_OTHER): Payer: PPO

## 2022-04-30 VITALS — BP 128/76 | HR 58 | Ht 65.0 in | Wt 210.0 lb

## 2022-04-30 DIAGNOSIS — M25561 Pain in right knee: Secondary | ICD-10-CM | POA: Diagnosis not present

## 2022-04-30 DIAGNOSIS — G8929 Other chronic pain: Secondary | ICD-10-CM | POA: Diagnosis not present

## 2022-04-30 DIAGNOSIS — M1711 Unilateral primary osteoarthritis, right knee: Secondary | ICD-10-CM

## 2022-04-30 NOTE — Progress Notes (Signed)
My right knee is hurting. ? ?I had seen her in the past for left knee pain.  She now has right knee pain. She has no new trauma, she has swelling and popping but no giving way.  This has had a gradual onset and has gotten worse over the last few weeks.  She has no trauma, no redness, no numbness.  She is on a blood thinner and had multiple medical problems. She cannot take NSAIDs.  She has pacemaker and cannot have MRI. ? ?Right knee has effusion, crepitus, medial joint line pain, positive medial [place], NV intact, slight swelling of both legs but no true edema.   Limp right. ? ?X-rays were done of the right knee, reported separately.  She has marked DJD medially of right knee. ? ?Encounter Diagnosis  ?Name Primary?  ? Chronic pain of right knee Yes  ? ?I have shown her the X-rays. ? ?PROCEDURE NOTE: ? ?The patient requests injections of the right knee , verbal consent was obtained. ? ?The right knee was prepped appropriately after time out was performed.  ? ?Sterile technique was observed and injection of 1 cc of DepoMedrol 40mg  with several cc's of plain xylocaine. Anesthesia was provided by ethyl chloride and a 20-gauge needle was used to inject the knee area. The injection was tolerated well.  A band aid dressing was applied. ? ?The patient was advised to apply ice later today and tomorrow to the injection sight as needed. ? ?Return in five weeks.  She is not a surgical candidate. ? ?Call if any problem. ? ?Precautions discussed. ? ?Electronically Signed ? , MD ?5/2/202310:50 AM ? ?

## 2022-05-02 ENCOUNTER — Other Ambulatory Visit: Payer: Self-pay | Admitting: Family Medicine

## 2022-05-03 ENCOUNTER — Encounter: Payer: Self-pay | Admitting: Orthopaedic Surgery

## 2022-05-05 ENCOUNTER — Encounter: Payer: Self-pay | Admitting: Cardiology

## 2022-05-07 ENCOUNTER — Ambulatory Visit (INDEPENDENT_AMBULATORY_CARE_PROVIDER_SITE_OTHER): Payer: PPO | Admitting: *Deleted

## 2022-05-07 ENCOUNTER — Encounter: Payer: Self-pay | Admitting: Internal Medicine

## 2022-05-07 ENCOUNTER — Ambulatory Visit: Payer: PPO | Admitting: Orthopaedic Surgery

## 2022-05-07 ENCOUNTER — Encounter: Payer: Self-pay | Admitting: Orthopaedic Surgery

## 2022-05-07 VITALS — BP 132/72 | HR 58 | Ht 65.5 in | Wt 209.0 lb

## 2022-05-07 DIAGNOSIS — M25561 Pain in right knee: Secondary | ICD-10-CM | POA: Diagnosis not present

## 2022-05-07 DIAGNOSIS — D6859 Other primary thrombophilia: Secondary | ICD-10-CM

## 2022-05-07 DIAGNOSIS — D6861 Antiphospholipid syndrome: Secondary | ICD-10-CM

## 2022-05-07 DIAGNOSIS — G8929 Other chronic pain: Secondary | ICD-10-CM

## 2022-05-07 DIAGNOSIS — Z5181 Encounter for therapeutic drug level monitoring: Secondary | ICD-10-CM | POA: Diagnosis not present

## 2022-05-07 DIAGNOSIS — I63549 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: Secondary | ICD-10-CM | POA: Diagnosis not present

## 2022-05-07 LAB — POCT INR: INR: 1.6 — AB (ref 2.0–3.0)

## 2022-05-07 MED ORDER — PREDNISONE 5 MG (21) PO TBPK: ORAL_TABLET | ORAL | 0 refills | Status: DC

## 2022-05-07 NOTE — Progress Notes (Signed)
My leg is hurting today. ? ?She has had pain of the right knee.   She had injection last week and that helped but now she is having swelling of the right anterior lower leg but no redness, no trauma.  She is on multiple medications and has history of heart failure. ? ?Right knee has just a slight effusion, crepitus, ROM 0 to 105, slight limp right, weakly positive medial McMurray, NV intact. She has some slight swelling anterior lower leg more laterally. She has no redness. Denna Haggard is negative. ROM ankle is full. She has no distal edema. ? ?Encounter Diagnosis  ?Name Primary?  ? Chronic pain of right knee Yes  ? ?I will call in prednisone dose pack. Her knee is actually better with no effusion today.  She must first call her cardiologist and make sure she can take the prednisone.  I told her it could cause fluid retention. ? ?She is to get support hose knee high for the right leg.  She is to put them on first thing in the morning. ? ?Keep her regular appointment. ? ?Call if any problem. ? ?Precautions discussed. ? ?Electronically Signed ?Darreld Mclean, MD ?5/9/202310:41 AM ? ?

## 2022-05-07 NOTE — Patient Instructions (Signed)
Take warfarin 1 tablet tonight then increase dose to 1/2 tablet daily except 1 tablet on Mondays, Wednesdays and Fridays Recheck in 2 wks 

## 2022-05-13 DIAGNOSIS — Z79891 Long term (current) use of opiate analgesic: Secondary | ICD-10-CM | POA: Diagnosis not present

## 2022-05-13 DIAGNOSIS — M25569 Pain in unspecified knee: Secondary | ICD-10-CM | POA: Diagnosis not present

## 2022-05-13 DIAGNOSIS — M545 Low back pain, unspecified: Secondary | ICD-10-CM | POA: Diagnosis not present

## 2022-05-13 DIAGNOSIS — M542 Cervicalgia: Secondary | ICD-10-CM | POA: Diagnosis not present

## 2022-05-13 DIAGNOSIS — R252 Cramp and spasm: Secondary | ICD-10-CM | POA: Diagnosis not present

## 2022-05-20 ENCOUNTER — Encounter: Payer: Self-pay | Admitting: Cardiology

## 2022-05-21 ENCOUNTER — Other Ambulatory Visit: Payer: Self-pay | Admitting: *Deleted

## 2022-05-21 ENCOUNTER — Ambulatory Visit (INDEPENDENT_AMBULATORY_CARE_PROVIDER_SITE_OTHER): Payer: PPO | Admitting: *Deleted

## 2022-05-21 DIAGNOSIS — D6859 Other primary thrombophilia: Secondary | ICD-10-CM | POA: Diagnosis not present

## 2022-05-21 DIAGNOSIS — I63549 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: Secondary | ICD-10-CM

## 2022-05-21 DIAGNOSIS — Z5181 Encounter for therapeutic drug level monitoring: Secondary | ICD-10-CM | POA: Diagnosis not present

## 2022-05-21 LAB — POCT INR: INR: 2.4 (ref 2.0–3.0)

## 2022-05-21 MED ORDER — ENTRESTO 24-26 MG PO TABS
1.0000 | ORAL_TABLET | Freq: Two times a day (BID) | ORAL | 3 refills | Status: DC
Start: 2022-05-21 — End: 2023-02-28

## 2022-05-21 NOTE — Patient Instructions (Signed)
Continue warfarin 1/2 tablet daily except 1 tablet on Mondays, Wednesdays and Fridays  Recheck in 4 wks.   

## 2022-05-24 ENCOUNTER — Telehealth: Payer: Self-pay | Admitting: Cardiology

## 2022-05-24 NOTE — Telephone Encounter (Signed)
New Message:     The pharmacist said the Warfarin 5 mg , brand name have changed. The pharmacist said you might want to check patient's blood work in 2 weeks.

## 2022-05-24 NOTE — Telephone Encounter (Signed)
Called and spoke to pt. She stated that she has about 1 month left of her current bottle of warfarin. Informed her that the pharmacy called and the manufacturer is changing for her warfarin and it can sometimes affect the INR. Informed pt that once she starts her new bottle of warfarin to call clinic so she can have her INR checked with in 2 weeks of starting new bottle. Pt verbalized understanding.

## 2022-05-24 NOTE — Telephone Encounter (Signed)
Spoke with Cathy-Pharmacist at [facility]- who stated that the manufacturer for pt's warfarin has changed to [employer]. Pharmacist stated that with the change in manufacturer, there could also be a small change in how the medication affects the blood and wanted to let provider know. I will fwd to pt's provider and Coumadin nurse, [name], RN for review/recommendations.

## 2022-05-26 ENCOUNTER — Encounter: Payer: Self-pay | Admitting: Family Medicine

## 2022-05-30 ENCOUNTER — Ambulatory Visit: Payer: PPO | Admitting: Family Medicine

## 2022-05-31 ENCOUNTER — Ambulatory Visit (INDEPENDENT_AMBULATORY_CARE_PROVIDER_SITE_OTHER): Payer: PPO

## 2022-05-31 DIAGNOSIS — I472 Ventricular tachycardia, unspecified: Secondary | ICD-10-CM | POA: Diagnosis not present

## 2022-06-02 LAB — CUP PACEART REMOTE DEVICE CHECK
Battery Remaining Longevity: 108 mo
Battery Remaining Percentage: 100 %
Brady Statistic RA Percent Paced: 13 %
Brady Statistic RV Percent Paced: 5 %
Date Time Interrogation Session: 20230602140000
HighPow Impedance: 54 Ohm
Implantable Lead Implant Date: 20081119
Implantable Lead Implant Date: 20081119
Implantable Lead Location: 753859
Implantable Lead Location: 753860
Implantable Lead Model: 185
Implantable Lead Model: 4136
Implantable Lead Serial Number: 211124
Implantable Lead Serial Number: 28383796
Implantable Pulse Generator Implant Date: 20170303
Lead Channel Impedance Value: 502 Ohm
Lead Channel Impedance Value: 583 Ohm
Lead Channel Pacing Threshold Amplitude: 0.6 V
Lead Channel Pacing Threshold Amplitude: 1.2 V
Lead Channel Pacing Threshold Pulse Width: 0.5 ms
Lead Channel Pacing Threshold Pulse Width: 0.5 ms
Lead Channel Setting Pacing Amplitude: 2 V
Lead Channel Setting Pacing Amplitude: 2.4 V
Lead Channel Setting Pacing Pulse Width: 0.5 ms
Lead Channel Setting Sensing Sensitivity: 0.6 mV
Pulse Gen Serial Number: 204398

## 2022-06-03 ENCOUNTER — Ambulatory Visit (HOSPITAL_COMMUNITY)
Admission: RE | Admit: 2022-06-03 | Discharge: 2022-06-03 | Disposition: A | Discharge status: Home / Self Care | Payer: PPO | Source: Ambulatory Visit | Attending: Cardiology | Admitting: Cardiology

## 2022-06-03 DIAGNOSIS — I6521 Occlusion and stenosis of right carotid artery: Secondary | ICD-10-CM | POA: Diagnosis not present

## 2022-06-03 DIAGNOSIS — I6529 Occlusion and stenosis of unspecified carotid artery: Secondary | ICD-10-CM | POA: Diagnosis not present

## 2022-06-04 ENCOUNTER — Ambulatory Visit: Payer: PPO | Admitting: Orthopaedic Surgery

## 2022-06-04 ENCOUNTER — Encounter: Payer: Self-pay | Admitting: Orthopaedic Surgery

## 2022-06-04 VITALS — BP 113/69 | HR 55 | Ht 66.0 in | Wt 213.0 lb

## 2022-06-04 DIAGNOSIS — M25561 Pain in right knee: Secondary | ICD-10-CM

## 2022-06-04 DIAGNOSIS — G8929 Other chronic pain: Secondary | ICD-10-CM

## 2022-06-04 DIAGNOSIS — Z7901 Long term (current) use of anticoagulants: Secondary | ICD-10-CM

## 2022-06-04 NOTE — Progress Notes (Signed)
My knee is still hurting.  She has less swelling of the right leg. She used the support hose and they work.  Her pain in the leg is better but the knee pain is still very painful with giving way, effusion, and popping.  She cannot have a MRI secondary to shunts.  I will get arthrogram CT of the right knee.  She is on coumadin.  Right knee is tender, effusion, crepitus, positive medial McMurray, ROM 0 to 110, limp right, NV intact.  She has only slight swelling of right lower leg, negative Homans.  Encounter Diagnoses  Name Primary?   Chronic pain of right knee Yes   Anticoagulated    Get CT arthrogram of the right knee.  Return in two weeks.  Call if any problem.  Precautions discussed.  Electronically Signed Darreld Mclean, MD 6/6/202310:48 AM

## 2022-06-07 NOTE — Progress Notes (Signed)
Remote ICD transmission.   

## 2022-06-11 ENCOUNTER — Other Ambulatory Visit: Payer: Self-pay | Admitting: Orthopaedic Surgery

## 2022-06-11 DIAGNOSIS — G8929 Other chronic pain: Secondary | ICD-10-CM

## 2022-06-12 ENCOUNTER — Other Ambulatory Visit (HOSPITAL_COMMUNITY): Payer: Self-pay | Admitting: Cardiology

## 2022-06-12 ENCOUNTER — Other Ambulatory Visit: Payer: Self-pay | Admitting: Student

## 2022-06-12 DIAGNOSIS — I2510 Atherosclerotic heart disease of native coronary artery without angina pectoris: Secondary | ICD-10-CM

## 2022-06-13 ENCOUNTER — Telehealth: Payer: Self-pay

## 2022-06-13 ENCOUNTER — Other Ambulatory Visit (HOSPITAL_COMMUNITY): Payer: PPO

## 2022-06-13 NOTE — Telephone Encounter (Signed)
Nonbillable remote reviewed. Normal device function.   Presenting rhythm shows slow VT at 116 bpm which is under the treatment detection rate of 130 bpm.  Sent to triage. Next remote 08/30/2022.  Unsuccessful telephone encounter to patient to follow up on slow NSVT 06/10/22 as presenting rhythm, mediations compliance, symptoms, and to request manual transmission to assess duration of episode. Hipaa compliant VM message left requesting call back to [phone].

## 2022-06-14 NOTE — Telephone Encounter (Signed)
Patient reports she felt palpations during this time, she was eating breakfast during this time. Reports she has been well overall and feeling well and compliant with medications. Patient will have upcoming apt within the next month to few months. Advised patient I will forward to Dr. Ladona Ridgel and will call if any changes are warranted. Voiced understanding and appreciative for call.   Requested another manual transmission to review presenting rhythm.

## 2022-06-14 NOTE — Telephone Encounter (Signed)
Manual transmission received 06/14/22 to review presenting rhythm.

## 2022-06-17 ENCOUNTER — Ambulatory Visit (HOSPITAL_COMMUNITY)
Admission: RE | Admit: 2022-06-17 | Discharge: 2022-06-17 | Disposition: A | Discharge status: Home / Self Care | Payer: PPO | Source: Ambulatory Visit | Attending: Orthopaedic Surgery | Admitting: Orthopaedic Surgery

## 2022-06-17 DIAGNOSIS — M1711 Unilateral primary osteoarthritis, right knee: Secondary | ICD-10-CM | POA: Diagnosis not present

## 2022-06-17 DIAGNOSIS — M25561 Pain in right knee: Secondary | ICD-10-CM | POA: Diagnosis not present

## 2022-06-17 DIAGNOSIS — G8929 Other chronic pain: Secondary | ICD-10-CM

## 2022-06-17 DIAGNOSIS — M7989 Other specified soft tissue disorders: Secondary | ICD-10-CM | POA: Diagnosis not present

## 2022-06-17 MED ORDER — IOHEXOL 180 MG/ML  SOLN
20.0000 mL | Freq: Once | INTRAMUSCULAR | Status: AC | PRN
Start: 2022-06-17 — End: 2022-06-17
  Administered 2022-06-17: 20 mL

## 2022-06-17 MED ORDER — IOHEXOL 180 MG/ML  SOLN
20.0000 mL | Freq: Once | INTRAMUSCULAR | Status: AC | PRN
  Administered 2022-06-17: 10 mL

## 2022-06-17 MED ORDER — LIDOCAINE HCL (PF) 1 % IJ SOLN
INTRAMUSCULAR | Status: AC
  Administered 2022-06-17: 3 mL
  Filled 2022-06-17: qty 5

## 2022-06-17 MED ORDER — POVIDONE-IODINE 10 % EX SOLN
CUTANEOUS | Status: AC
  Filled 2022-06-17: qty 14.8

## 2022-06-17 MED ORDER — SODIUM CHLORIDE (PF) 0.9 % IJ SOLN
INTRAMUSCULAR | Status: AC
  Administered 2022-06-17: 10 mL
  Filled 2022-06-17: qty 10

## 2022-06-17 NOTE — Procedures (Signed)
Preprocedure Dx: Chronic pain of right knee Postprocedure Dx: Chronic pain of right knee Procedure  Fluoroscopically guided RT joint injection for CT arthrogrpahy Radiologist:  Tyron Russell Anesthesia:  3 ml of 1% lidocaine Injectate:  30 ml Omni-180, 10 ml sterile saline Fluoro time:  0 minutes 24 seconds EBL:   None Complications: None

## 2022-06-18 ENCOUNTER — Ambulatory Visit: Payer: PPO | Admitting: Orthopaedic Surgery

## 2022-06-25 ENCOUNTER — Ambulatory Visit (INDEPENDENT_AMBULATORY_CARE_PROVIDER_SITE_OTHER): Payer: PPO | Admitting: *Deleted

## 2022-06-25 DIAGNOSIS — Z91018 Allergy to other foods: Secondary | ICD-10-CM

## 2022-06-25 DIAGNOSIS — D6859 Other primary thrombophilia: Secondary | ICD-10-CM | POA: Diagnosis not present

## 2022-06-25 DIAGNOSIS — Z5181 Encounter for therapeutic drug level monitoring: Secondary | ICD-10-CM

## 2022-06-25 DIAGNOSIS — I63549 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: Secondary | ICD-10-CM

## 2022-06-25 LAB — POCT INR: INR: 2.5 (ref 2.0–3.0)

## 2022-06-26 ENCOUNTER — Encounter: Payer: Self-pay | Admitting: Orthopaedic Surgery

## 2022-06-26 ENCOUNTER — Encounter: Payer: Self-pay | Admitting: Internal Medicine

## 2022-07-09 ENCOUNTER — Ambulatory Visit (INDEPENDENT_AMBULATORY_CARE_PROVIDER_SITE_OTHER): Payer: PPO | Admitting: Orthopaedic Surgery

## 2022-07-09 ENCOUNTER — Encounter: Payer: Self-pay | Admitting: Orthopaedic Surgery

## 2022-07-09 DIAGNOSIS — Z7901 Long term (current) use of anticoagulants: Secondary | ICD-10-CM | POA: Diagnosis not present

## 2022-07-09 DIAGNOSIS — M25561 Pain in right knee: Secondary | ICD-10-CM | POA: Diagnosis not present

## 2022-07-09 DIAGNOSIS — G8929 Other chronic pain: Secondary | ICD-10-CM

## 2022-07-09 NOTE — Progress Notes (Signed)
I got that arthrogram.  She had CT arthrogram of the right knee.  It showed: IMPRESSION: Tricompartmental osteoarthritis of the right knee, moderate-to-severe in the medial compartment.  She has significant medical problems that preclude consideration of a total knee on the right.  She understands this.  I have independently reviewed and interpreted x-rays of this patient done at another site by another physician or qualified health professional.  I will inject the right knee every four to six weeks as needed for pain control.  She is agreeable to this.  We discussed viscosupplementation and she would not like to do this.  Encounter Diagnoses  Name Primary?   Chronic pain of right knee Yes   Anticoagulated    PROCEDURE NOTE:  The patient requests injections of the right knee , verbal consent was obtained.  The right knee was prepped appropriately after time out was performed.   Sterile technique was observed and injection of 1 cc of DepoMedrol 40mg  with several cc's of plain xylocaine. Anesthesia was provided by ethyl chloride and a 20-gauge needle was used to inject the knee area. The injection was tolerated well.  A band aid dressing was applied.  The patient was advised to apply ice later today and tomorrow to the injection sight as needed.  Return in one month.  Call if any problem.  Precautions discussed.  Electronically Signed , MD 7/11/202310:05 AM

## 2022-07-22 ENCOUNTER — Encounter: Payer: Self-pay | Admitting: Cardiology

## 2022-07-30 ENCOUNTER — Ambulatory Visit (INDEPENDENT_AMBULATORY_CARE_PROVIDER_SITE_OTHER): Payer: PPO | Admitting: *Deleted

## 2022-07-30 DIAGNOSIS — D6859 Other primary thrombophilia: Secondary | ICD-10-CM | POA: Diagnosis not present

## 2022-07-30 DIAGNOSIS — Z5181 Encounter for therapeutic drug level monitoring: Secondary | ICD-10-CM | POA: Diagnosis not present

## 2022-07-30 DIAGNOSIS — D6861 Antiphospholipid syndrome: Secondary | ICD-10-CM

## 2022-07-30 DIAGNOSIS — I63549 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: Secondary | ICD-10-CM | POA: Diagnosis not present

## 2022-07-30 LAB — POCT INR: INR: 2.2 (ref 2.0–3.0)

## 2022-07-30 NOTE — Patient Instructions (Signed)
Continue warfarin 1/2 tablet daily except 1 tablet on Mondays, Wednesdays and Fridays Recheck in 6 wks 

## 2022-08-01 ENCOUNTER — Encounter: Payer: Self-pay | Admitting: Internal Medicine

## 2022-08-05 DIAGNOSIS — M25569 Pain in unspecified knee: Secondary | ICD-10-CM | POA: Diagnosis not present

## 2022-08-05 DIAGNOSIS — R252 Cramp and spasm: Secondary | ICD-10-CM | POA: Diagnosis not present

## 2022-08-05 DIAGNOSIS — M542 Cervicalgia: Secondary | ICD-10-CM | POA: Diagnosis not present

## 2022-08-05 DIAGNOSIS — Z79891 Long term (current) use of opiate analgesic: Secondary | ICD-10-CM | POA: Diagnosis not present

## 2022-08-05 DIAGNOSIS — M545 Low back pain, unspecified: Secondary | ICD-10-CM | POA: Diagnosis not present

## 2022-08-09 ENCOUNTER — Telehealth (HOSPITAL_COMMUNITY): Payer: Self-pay | Admitting: Pharmacy Technician

## 2022-08-09 NOTE — Telephone Encounter (Signed)
Advanced Heart Failure Patient Advocate Encounter  The patient was approved for a [employer] grant that will help cover the cost of Jardiance. Total amount awarded, $10,000. Eligibility, 07/10/22 - 07/10/23.  ID [id number]  BIN [id number]  PCN [id number]  Group [id number]  Set approval information via mychart.  Archer Asa, CPhT

## 2022-08-12 ENCOUNTER — Other Ambulatory Visit (HOSPITAL_COMMUNITY): Payer: Self-pay | Admitting: Cardiology

## 2022-08-13 ENCOUNTER — Ambulatory Visit: Payer: PPO | Admitting: Orthopaedic Surgery

## 2022-08-13 ENCOUNTER — Encounter: Payer: Self-pay | Admitting: Orthopaedic Surgery

## 2022-08-13 DIAGNOSIS — M25561 Pain in right knee: Secondary | ICD-10-CM | POA: Diagnosis not present

## 2022-08-13 DIAGNOSIS — G8929 Other chronic pain: Secondary | ICD-10-CM

## 2022-08-13 DIAGNOSIS — Z7901 Long term (current) use of anticoagulants: Secondary | ICD-10-CM

## 2022-08-13 MED ORDER — METHYLPREDNISOLONE ACETATE 40 MG/ML IJ SUSP
40.0000 mg | Freq: Once | INTRAMUSCULAR | Status: AC
  Administered 2022-08-13: 40 mg via INTRA_ARTICULAR

## 2022-08-13 NOTE — Addendum Note (Signed)
Addended by: Recardo Evangelist A on: 08/13/2022 11:01 AM   Modules accepted: Orders

## 2022-08-13 NOTE — Progress Notes (Signed)
PROCEDURE NOTE:  The patient requests injections of the right knee , verbal consent was obtained.  The right knee was prepped appropriately after time out was performed.   Sterile technique was observed and injection of 1 cc of DepoMedrol 40mg  with several cc's of plain xylocaine. Anesthesia was provided by ethyl chloride and a 20-gauge needle was used to inject the knee area. The injection was tolerated well.  A band aid dressing was applied.  The patient was advised to apply ice later today and tomorrow to the injection sight as needed.  Encounter Diagnoses  Name Primary?   Chronic pain of right knee Yes   Anticoagulated    Return in six weeks.  Call if any problem.  Precautions discussed.  Electronically Signed , MD 8/15/202310:13 AM

## 2022-08-14 ENCOUNTER — Ambulatory Visit: Payer: PPO | Admitting: Orthopaedic Surgery

## 2022-08-14 ENCOUNTER — Other Ambulatory Visit: Payer: Self-pay

## 2022-08-14 ENCOUNTER — Encounter: Payer: Self-pay | Admitting: Orthopaedic Surgery

## 2022-08-14 DIAGNOSIS — G8929 Other chronic pain: Secondary | ICD-10-CM

## 2022-08-14 NOTE — Telephone Encounter (Signed)
Order for cane, last ov notes, pt demographics, and copy of insurance card faxed to [facility] per patient request.

## 2022-08-15 ENCOUNTER — Encounter: Payer: Self-pay | Admitting: Internal Medicine

## 2022-08-20 ENCOUNTER — Other Ambulatory Visit: Payer: Self-pay

## 2022-08-20 ENCOUNTER — Encounter: Payer: Self-pay | Admitting: Internal Medicine

## 2022-08-20 DIAGNOSIS — G8929 Other chronic pain: Secondary | ICD-10-CM

## 2022-08-30 ENCOUNTER — Ambulatory Visit (INDEPENDENT_AMBULATORY_CARE_PROVIDER_SITE_OTHER): Payer: PPO

## 2022-08-30 DIAGNOSIS — I472 Ventricular tachycardia, unspecified: Secondary | ICD-10-CM | POA: Diagnosis not present

## 2022-09-03 LAB — CUP PACEART REMOTE DEVICE CHECK
Battery Remaining Longevity: 96 mo
Battery Remaining Percentage: 100 %
Brady Statistic RA Percent Paced: 17 %
Brady Statistic RV Percent Paced: 7 %
Date Time Interrogation Session: 20230901021100
HighPow Impedance: 56 Ohm
Implantable Lead Implant Date: 20081119
Implantable Lead Implant Date: 20081119
Implantable Lead Location: 753859
Implantable Lead Location: 753860
Implantable Lead Model: 185
Implantable Lead Model: 4136
Implantable Lead Serial Number: 211124
Implantable Lead Serial Number: 28383796
Implantable Pulse Generator Implant Date: 20170303
Lead Channel Impedance Value: 522 Ohm
Lead Channel Impedance Value: 595 Ohm
Lead Channel Pacing Threshold Amplitude: 0.6 V
Lead Channel Pacing Threshold Amplitude: 1.2 V
Lead Channel Pacing Threshold Pulse Width: 0.5 ms
Lead Channel Pacing Threshold Pulse Width: 0.5 ms
Lead Channel Setting Pacing Amplitude: 2 V
Lead Channel Setting Pacing Amplitude: 2.4 V
Lead Channel Setting Pacing Pulse Width: 0.5 ms
Lead Channel Setting Sensing Sensitivity: 0.6 mV
Pulse Gen Serial Number: 204398

## 2022-09-10 ENCOUNTER — Ambulatory Visit: Payer: PPO | Attending: Cardiology | Admitting: *Deleted

## 2022-09-10 DIAGNOSIS — D6861 Antiphospholipid syndrome: Secondary | ICD-10-CM

## 2022-09-10 DIAGNOSIS — Z5181 Encounter for therapeutic drug level monitoring: Secondary | ICD-10-CM

## 2022-09-10 DIAGNOSIS — I63549 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: Secondary | ICD-10-CM | POA: Diagnosis not present

## 2022-09-10 DIAGNOSIS — D6859 Other primary thrombophilia: Secondary | ICD-10-CM

## 2022-09-10 DIAGNOSIS — I4819 Other persistent atrial fibrillation: Secondary | ICD-10-CM

## 2022-09-10 LAB — POCT INR: INR: 3.1 — AB (ref 2.0–3.0)

## 2022-09-10 NOTE — Patient Instructions (Signed)
Continue warfarin 1/2 tablet daily except 1 tablet on Mondays, Wednesdays and Fridays Eat extra greens today Recheck in 6 wks

## 2022-09-12 ENCOUNTER — Other Ambulatory Visit (HOSPITAL_COMMUNITY): Payer: Self-pay | Admitting: Cardiology

## 2022-09-12 DIAGNOSIS — I5042 Chronic combined systolic (congestive) and diastolic (congestive) heart failure: Secondary | ICD-10-CM

## 2022-09-18 NOTE — Progress Notes (Signed)
Remote ICD transmission.   

## 2022-09-24 ENCOUNTER — Encounter: Payer: Self-pay | Admitting: Orthopaedic Surgery

## 2022-09-24 ENCOUNTER — Ambulatory Visit (INDEPENDENT_AMBULATORY_CARE_PROVIDER_SITE_OTHER): Payer: PPO | Admitting: Orthopaedic Surgery

## 2022-09-24 ENCOUNTER — Other Ambulatory Visit (HOSPITAL_COMMUNITY): Payer: Self-pay | Admitting: Cardiology

## 2022-09-24 DIAGNOSIS — M25561 Pain in right knee: Secondary | ICD-10-CM

## 2022-09-24 DIAGNOSIS — Z7901 Long term (current) use of anticoagulants: Secondary | ICD-10-CM

## 2022-09-24 DIAGNOSIS — G8929 Other chronic pain: Secondary | ICD-10-CM | POA: Diagnosis not present

## 2022-09-24 MED ORDER — METHYLPREDNISOLONE ACETATE 40 MG/ML IJ SUSP
40.0000 mg | Freq: Once | INTRAMUSCULAR | Status: AC
  Administered 2022-09-24: 40 mg via INTRA_ARTICULAR

## 2022-09-24 NOTE — Progress Notes (Signed)
PROCEDURE NOTE:  The patient requests injections of the right knee , verbal consent was obtained.  The right knee was prepped appropriately after time out was performed.   Sterile technique was observed and injection of 1 cc of DepoMedrol 40mg  with several cc's of plain xylocaine. Anesthesia was provided by ethyl chloride and a 20-gauge needle was used to inject the knee area. The injection was tolerated well.  A band aid dressing was applied.  The patient was advised to apply ice later today and tomorrow to the injection sight as needed.  Encounter Diagnoses  Name Primary?   Chronic pain of right knee Yes   Anticoagulated    Return in six weeks.  Call if any problem.  Precautions discussed.  Electronically Signed Sanjuana Kava, MD 9/26/202310:27 AM

## 2022-10-01 ENCOUNTER — Encounter: Payer: Self-pay | Admitting: Internal Medicine

## 2022-10-01 ENCOUNTER — Ambulatory Visit: Payer: PPO | Attending: Internal Medicine | Admitting: Internal Medicine

## 2022-10-01 VITALS — BP 116/70 | HR 58 | Ht 66.0 in | Wt 203.0 lb

## 2022-10-01 DIAGNOSIS — I472 Ventricular tachycardia, unspecified: Secondary | ICD-10-CM | POA: Diagnosis not present

## 2022-10-01 MED ORDER — AMIODARONE HCL 200 MG PO TABS: ORAL_TABLET | ORAL | 3 refills | Status: DC

## 2022-10-01 NOTE — Patient Instructions (Signed)
Medication Instructions:  Take Amiodarone 11/2 Tablets on Monday- Friday and Take 2 Tablets on Saturday and Sunday   *If you need a refill on your cardiac medications before your next appointment, please call your pharmacy*   Lab Work: NONE   If you have labs (blood work) drawn today and your tests are completely normal, you will receive your results only by: [place] (if you have MyChart) OR A paper copy in the mail If you have any lab test that is abnormal or we need to change your treatment, we will call you to review the results.   Testing/Procedures: NONE    Follow-Up: At [facility], you and your health needs are our priority.  As part of our continuing mission to provide you with exceptional heart care, we have created designated Provider Care Teams.  These Care Teams include your primary Cardiologist (physician) and Advanced Practice Providers (APPs -  Physician Assistants and Nurse Practitioners) who all work together to provide you with the care you need, when you need it.  We recommend signing up for the patient portal called "MyChart".  Sign up information is provided on this After Visit Summary.  MyChart is used to connect with patients for Virtual Visits (Telemedicine).  Patients are able to view lab/test results, encounter notes, upcoming appointments, etc.  Non-urgent messages can be sent to your provider as well.   To learn more about what you can do with MyChart, go to [email or web address].    Your next appointment:   1 year(s)  The format for your next appointment:   In Person  Provider:   Cristopher Peru, MD    Other Instructions Thank you for choosing [facility]!    Important Information About Sugar

## 2022-10-01 NOTE — Progress Notes (Signed)
HPI [name] [name] returns today for followup of VT and chronic systolic heart failure. She is a pleasant 61 yo woman with the above problems and an elevated DFT. She had a VT ablation and has continued to have VT but with successful ATP. She has not had syncope. No edema. She has class 2 CHF symptoms. She has problems with anxiety. When I saw her last, she had more VT but at a lower rate. The last was less than 120/min. She has not had syncope.  Allergies  Allergen Reactions   Beef-Derived Products Anaphylaxis    From tick bite   Metoclopramide Hcl Anaphylaxis    Non responsive   Penicillins Anaphylaxis    Shock Has patient had a PCN reaction causing immediate rash, facial/tongue/throat swelling, SOB or lightheadedness with hypotension: Yes Has patient had a PCN reaction causing severe rash involving mucus membranes or skin necrosis: No Has patient had a PCN reaction that required hospitalization Yes Has patient had a PCN reaction occurring within the last 10 years: No If all of the above answers are "NO", then may proceed with Cephalosporin    Pork-Derived Products Anaphylaxis    From tick bite. Has tolerated heparin/lovenox   Metoprolol Other (See Comments)    Syncope    Statins Other (See Comments)    Myalgias     Current Outpatient Medications  Medication Sig Dispense Refill   ALPRAZolam (XANAX) 0.25 MG tablet Take 1 tablet (0.25 mg total) by mouth daily as needed for anxiety. 15 tablet 2   amiodarone (PACERONE) 200 MG tablet Take Amiodarone 1 1/2 Tablet Daily Monday -Thursday. Take 2 Tablets Daily on Friday - Sunday 180 tablet 3   carvedilol (COREG) 12.5 MG tablet Take 1 tablet (12.5 mg total) by mouth 2 (two) times daily. NEEDS FOLLOW UP APPOINTMENT FOR ANYMORE REFILLS 120 tablet 0   DULoxetine (CYMBALTA) 60 MG capsule Take 60 mg by mouth daily.     empagliflozin (JARDIANCE) 10 MG TABS tablet Take 1 tablet (10 mg total) by mouth daily before breakfast. 90 tablet 3    EPINEPHRINE 0.3 mg/0.3 mL IJ SOAJ injection USE AS DIRECTED 2 each 2   ezetimibe (ZETIA) 10 MG tablet TAKE 1 TABLET BY MOUTH EVERY DAY 30 tablet 11   fluticasone (FLONASE) 50 MCG/ACT nasal spray Place 2 sprays into both nostrils daily. 16 g 6   isosorbide mononitrate (IMDUR) 30 MG 24 hr tablet TAKE 1 TABLET BY MOUTH DAILY 90 tablet 3   mexiletine (MEXITIL) 150 MG capsule Take 2 capsules (300 mg total) by mouth 3 (three) times daily. 540 capsule 3   Multiple Vitamins-Minerals (MULTIVITAMINS THER. W/MINERALS) TABS Take 1 tablet by mouth daily.       nitroGLYCERIN (NITROSTAT) 0.4 MG SL tablet PLACE 1 TABLET UNDER THE TONGUE EVERY 5 MINUTES FOR 3 DOSES AS NEEDED CHEST PAIN 25 tablet 3   oxyCODONE-acetaminophen (PERCOCET) 10-325 MG tablet Take 1 tablet by mouth every 4 (four) hours as needed for pain.     pantoprazole (PROTONIX) 40 MG tablet TAKE 1 TABLET BY MOUTH DAILY 30 tablet 11   potassium chloride SA (KLOR-CON) 20 MEQ tablet Take 2 tablets (40 mEq total) by mouth every morning AND 1 tablet (20 mEq total) every evening. 270 tablet 3   pravastatin (PRAVACHOL) 20 MG tablet Take 1 tablet (20 mg total) by mouth every evening. 30 tablet 11   sacubitril-valsartan (ENTRESTO) 24-26 MG Take 1 tablet by mouth 2 (two) times daily. 180 tablet 3  sennosides-docusate sodium (SENOKOT-S) 8.6-50 MG tablet Take 1 tablet by mouth daily as needed.     spironolactone (ALDACTONE) 25 MG tablet TAKE 1 TABLET BY MOUTH EVERY DAY 30 tablet 11   torsemide (DEMADEX) 20 MG tablet Take 4 tablets (80 mg total) by mouth daily. NEEDS FOLLOW UP APPOINTMENT FOR ANYMORE REFILL S 360 tablet 0   traZODone (DESYREL) 100 MG tablet Take 100 mg by mouth at bedtime as needed for sleep.     warfarin (COUMADIN) 5 MG tablet Take 1/2 - 1 tablet daily or as directed by Coumadin Clinic 30 tablet 6   No current facility-administered medications for this visit.     Past Medical History:  Diagnosis Date   Aneurysm of internal carotid artery     Anti-phospholipid syndrome (HCC)    Anticardiolipin antibody positive    Chronic Coumadin   Brain aneurysm    followed by Dr. Estanislado Pandy   Chronic combined systolic (congestive) and diastolic (congestive) heart failure (HCC)    Cocaine abuse in remission [facility])    Coronary atherosclerosis of native coronary artery    Previous invasive cardiac testing was done at a facility in [place], [place].  Occluded diagonal, Diffuse LAD disease, Occluded Cx, and non obstructive RCA.    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   Past Surgical History:  Procedure Laterality Date   ABDOMINAL AORTAGRAM N/A 05/25/2014   Procedure: ABDOMINAL AORTAGRAM;  Surgeon: Wellington Hampshire, MD;  Location: [place] CATH LAB;  Service: Cardiovascular;  Laterality: N/A;   APPENDECTOMY     BACK SURGERY  2010   Dr. Trenton Gammon L1 cage, 5 disc fusion   CARDIAC DEFIBRILLATOR PLACEMENT  2008   [employer] ICD   CARDIOVERSION N/A 12/01/2020   Procedure: CARDIOVERSION;  Surgeon: Larey Dresser, MD;  Location: [facility] ENDOSCOPY;  Service: Cardiovascular;  Laterality: N/A;   CORONARY STENT INTERVENTION N/A 01/08/2021   Procedure: CORONARY STENT INTERVENTION;  Surgeon: Martinique, Peter M, MD;  Location: [place] CV LAB;  Service: Cardiovascular;  Laterality: N/A;   EP IMPLANTABLE DEVICE N/A 03/01/2016   Procedure: ICD Generator Changeout;  Surgeon: Evans Lance, MD;  Location: [place] CV LAB;  Service: Cardiovascular;  Laterality: N/A;   ICD/BIV ICD FUNCTION(DFT) TEST  N/A 03/14/2021   Procedure: ICD/BIV ICD FUNCTION (DFT) TEST;  Surgeon: Evans Lance, MD;  Location: [place] CV LAB;  Service: Cardiovascular;  Laterality: N/A;   L1 corpectomy   11/29/2009   Laryngeal polyp excision     RIGHT/LEFT HEART CATH AND CORONARY ANGIOGRAPHY N/A 12/18/2020   Procedure: RIGHT/LEFT HEART CATH AND CORONARY ANGIOGRAPHY;  Surgeon: Jolaine Artist, MD;  Location: [place] CV LAB;  Service: Cardiovascular;  Laterality: N/A;   RIGHT/LEFT HEART CATH AND CORONARY ANGIOGRAPHY N/A 03/05/2021   Procedure: RIGHT/LEFT HEART CATH AND CORONARY ANGIOGRAPHY;  Surgeon: Larey Dresser, MD;  Location: [place] CV LAB;  Service: Cardiovascular;  Laterality: N/A;   TEE WITHOUT CARDIOVERSION N/A  12/01/2020   Procedure: TRANSESOPHAGEAL ECHOCARDIOGRAM (TEE);  Surgeon: [name], MD;  Location: [facility] ENDOSCOPY;  Service: Cardiovascular;  Laterality: N/AFloyce Stakes ABLATION N/A 03/08/2021   Procedure: Floyce Stakes ABLATION;  Surgeon: Lanier Prude, MD;  Location: [facility] INVASIVE CV LAB;  Service: Cardiovascular;  Laterality: N/A;     Family History  Problem Relation Age of Onset   Uterine cancer Mother    Heart disease Father 11   Hyperlipidemia Father    Hypertension Father    Ankylosing spondylitis Sister    Heart disease Sister    Hypertension Sister    Stomach cancer Brother 66   Heart attack Brother      Social History   Socioeconomic History   Marital status: Single    Spouse name: Not on file   Number of children: Not on file   Years of education: Not on file   Highest education level: Not on file  Occupational History   Occupation: Disabled    Comment: ESL  Tobacco Use   Smoking status: Former    Packs/day: 0.25    Years: 43.00    Total pack years: 10.75    Types: Cigarettes, E-cigarettes    Start date: 03/03/1978    Quit date: 07/21/2021    Years since quitting: 1.1   Smokeless tobacco: Current   Tobacco comments:    4 ciggs per day  Vaping Use    Vaping Use: Every day  Substance and Sexual Activity   Alcohol use: No    Alcohol/week: 0.0 standard drinks of alcohol   Drug use: No    Comment: Prior history of cocaine   Sexual activity: Not Currently    Birth control/protection: None  Other Topics Concern   Not on file  Social History Narrative   Not on file   Social Determinants of Health   Financial Resource Strain: Low Risk  (03/28/2022)   Overall Financial Resource Strain (CARDIA)    Difficulty of Paying Living Expenses: Not hard at all  Food Insecurity: No Food Insecurity (03/28/2022)   Hunger Vital Sign    Worried About Running Out of Food in the Last Year: Never true    Ran Out of Food in the Last Year: Never true  Transportation Needs: No Transportation Needs (03/28/2022)   PRAPARE - Administrator, Civil Service (Medical): No    Lack of Transportation (Non-Medical): No  Physical Activity: Not on file  Stress: Not on file  Social Connections: Not on file  Intimate Partner Violence: Not on file     BP 116/70   Pulse (!) 58   Ht 5\' 6"  (1.676 m)   Wt 203 lb (92.1 kg)   SpO2 98%   BMI 32.77 kg/m   Physical Exam:  Well appearing NAD HEENT: Unremarkable Neck:  No JVD, no thyromegally Lymphatics:  No adenopathy Back:  No CVA tenderness Lungs:  Clear HEART:  Regular rate rhythm, no murmurs, no rubs, no clicks Abd:  soft, positive bowel sounds, no organomegally, no rebound, no guarding Ext:  2 plus pulses, no edema, no cyanosis, no clubbing Skin:  No rashes no nodules Neuro:  CN II through XII intact, motor grossly intact  DEVICE  Normal device function.  See PaceArt for details.   Assess/Plan:  1. VT - She has had a single symptomatic episode below her VT detection which stopped spontaneously. I have asked her to reduce her dose of amio to 400 mg on Sat/Sun and  300 daily Mon-Fri. . Continue mexitil but will increase to 300 mg 3 times a day. We discussed repeat VT ablation. However with her VT  much slower and better tolerated and infrequent, I would not recommend at this time. 2. Chronic systolic heart failure - her symptoms are class 2. We will follow. 3. ICD - her [place] device is working normally. We will recheck in several months. 4. dyslipidemia - she will continue her statin therapy.    [name] Gowda Kataleia Quaranta,MD

## 2022-10-14 ENCOUNTER — Other Ambulatory Visit: Payer: Self-pay | Admitting: Internal Medicine

## 2022-10-22 ENCOUNTER — Ambulatory Visit: Payer: PPO | Attending: Cardiology | Admitting: *Deleted

## 2022-10-22 DIAGNOSIS — I4819 Other persistent atrial fibrillation: Secondary | ICD-10-CM

## 2022-10-22 DIAGNOSIS — Z5181 Encounter for therapeutic drug level monitoring: Secondary | ICD-10-CM

## 2022-10-22 LAB — POCT INR: INR: 3.9 — AB (ref 2.0–3.0)

## 2022-10-22 NOTE — Patient Instructions (Signed)
Hold warfarin tonight then decrease dose to 1/2 tablet daily except 1 tablet on Mondays and Fridays Eat extra greens today Recheck in 3 wks

## 2022-10-25 ENCOUNTER — Other Ambulatory Visit (HOSPITAL_COMMUNITY): Payer: Self-pay | Admitting: Cardiology

## 2022-10-28 DIAGNOSIS — M25569 Pain in unspecified knee: Secondary | ICD-10-CM | POA: Diagnosis not present

## 2022-10-28 DIAGNOSIS — M545 Low back pain, unspecified: Secondary | ICD-10-CM | POA: Diagnosis not present

## 2022-10-28 DIAGNOSIS — R252 Cramp and spasm: Secondary | ICD-10-CM | POA: Diagnosis not present

## 2022-10-28 DIAGNOSIS — M542 Cervicalgia: Secondary | ICD-10-CM | POA: Diagnosis not present

## 2022-10-31 ENCOUNTER — Telehealth: Payer: Self-pay

## 2022-10-31 MED ORDER — AMIODARONE HCL 200 MG PO TABS
ORAL_TABLET | ORAL | 3 refills | Status: DC
Start: 2022-10-31 — End: 2023-11-10

## 2022-10-31 NOTE — Telephone Encounter (Signed)
Alert remote reviewed. Normal device function.   There were two VT arrhythmias detected that were both successfully converted with one burst of ATP, sent to triage.  Outreach made to [name] [name].  She was aware of tachycardia.  She has been compliant with amiodarone reduction made in October.  She would like to increase back to her previous dosing which she felt was effective.  Advised would send to Dr. Lovena Le for review.

## 2022-10-31 NOTE — Telephone Encounter (Signed)
Discussed with Dr. Lovena Le.  Ok to return to her previous amiodarone dosing.  Pt advised.  Pt happy to be returning to her prior dose.  Prescription sent to pharmacy.  Increase amiodarone back to - Amiodarone 200 mg- Take 1.5 tablets by mouth Monday through Thursday.  Take 2 tablets by mouth Friday through Sunday.

## 2022-11-05 ENCOUNTER — Encounter: Payer: Self-pay | Admitting: Orthopaedic Surgery

## 2022-11-05 ENCOUNTER — Ambulatory Visit: Payer: PPO | Admitting: Orthopaedic Surgery

## 2022-11-05 DIAGNOSIS — G8929 Other chronic pain: Secondary | ICD-10-CM

## 2022-11-05 DIAGNOSIS — M25561 Pain in right knee: Secondary | ICD-10-CM | POA: Diagnosis not present

## 2022-11-05 DIAGNOSIS — Z7901 Long term (current) use of anticoagulants: Secondary | ICD-10-CM

## 2022-11-05 NOTE — Progress Notes (Signed)
PROCEDURE NOTE:  The patient requests injections of the right knee , verbal consent was obtained.  The right knee was prepped appropriately after time out was performed.   Sterile technique was observed and injection of 1 cc of DepoMedrol 40mg  with several cc's of plain xylocaine. Anesthesia was provided by ethyl chloride and a 20-gauge needle was used to inject the knee area. The injection was tolerated well.  A band aid dressing was applied.  The patient was advised to apply ice later today and tomorrow to the injection sight as needed.  Encounter Diagnoses  Name Primary?   Chronic pain of right knee Yes   Anticoagulated    I will see prn.  Call if any problem.  Precautions discussed.  Electronically Signed Sanjuana Kava, MD 11/7/202310:24 AM

## 2022-11-11 ENCOUNTER — Encounter: Payer: Self-pay | Admitting: Cardiology

## 2022-11-12 ENCOUNTER — Ambulatory Visit: Payer: PPO | Attending: Cardiology | Admitting: *Deleted

## 2022-11-12 DIAGNOSIS — I4819 Other persistent atrial fibrillation: Secondary | ICD-10-CM

## 2022-11-12 DIAGNOSIS — D6859 Other primary thrombophilia: Secondary | ICD-10-CM

## 2022-11-12 DIAGNOSIS — Z5181 Encounter for therapeutic drug level monitoring: Secondary | ICD-10-CM

## 2022-11-12 LAB — POCT INR: INR: 2.4 (ref 2.0–3.0)

## 2022-11-12 NOTE — Patient Instructions (Addendum)
Continue warfarin 1/2 tablet daily except 1 tablet on Mondays and Thursdays Continue greens Recheck in 4 wks

## 2022-11-13 ENCOUNTER — Other Ambulatory Visit (HOSPITAL_COMMUNITY): Payer: Self-pay | Admitting: Cardiology

## 2022-11-13 DIAGNOSIS — I5042 Chronic combined systolic (congestive) and diastolic (congestive) heart failure: Secondary | ICD-10-CM

## 2022-11-29 ENCOUNTER — Ambulatory Visit (INDEPENDENT_AMBULATORY_CARE_PROVIDER_SITE_OTHER): Payer: Medicare PPO

## 2022-11-29 DIAGNOSIS — I255 Ischemic cardiomyopathy: Secondary | ICD-10-CM

## 2022-11-30 LAB — CUP PACEART REMOTE DEVICE CHECK
Battery Remaining Longevity: 96 mo
Battery Remaining Percentage: 100 %
Brady Statistic RA Percent Paced: 13 %
Brady Statistic RV Percent Paced: 5 %
Date Time Interrogation Session: 20231201021200
HighPow Impedance: 52 Ohm
Implantable Lead Connection Status: 753985
Implantable Lead Connection Status: 753985
Implantable Lead Implant Date: 20081119
Implantable Lead Implant Date: 20081119
Implantable Lead Location: 753859
Implantable Lead Location: 753860
Implantable Lead Model: 185
Implantable Lead Model: 4136
Implantable Lead Serial Number: 211124
Implantable Lead Serial Number: 28383796
Implantable Pulse Generator Implant Date: 20170303
Lead Channel Impedance Value: 532 Ohm
Lead Channel Impedance Value: 593 Ohm
Lead Channel Pacing Threshold Amplitude: 0.7 V
Lead Channel Pacing Threshold Amplitude: 1.2 V
Lead Channel Pacing Threshold Pulse Width: 0.5 ms
Lead Channel Pacing Threshold Pulse Width: 0.5 ms
Lead Channel Setting Pacing Amplitude: 2 V
Lead Channel Setting Pacing Amplitude: 2.4 V
Lead Channel Setting Pacing Pulse Width: 0.5 ms
Lead Channel Setting Sensing Sensitivity: 0.6 mV
Pulse Gen Serial Number: 204398

## 2022-12-05 DIAGNOSIS — Z79891 Long term (current) use of opiate analgesic: Secondary | ICD-10-CM | POA: Diagnosis not present

## 2022-12-05 DIAGNOSIS — M545 Low back pain, unspecified: Secondary | ICD-10-CM | POA: Diagnosis not present

## 2022-12-05 DIAGNOSIS — M542 Cervicalgia: Secondary | ICD-10-CM | POA: Diagnosis not present

## 2022-12-05 DIAGNOSIS — R252 Cramp and spasm: Secondary | ICD-10-CM | POA: Diagnosis not present

## 2022-12-05 DIAGNOSIS — M25569 Pain in unspecified knee: Secondary | ICD-10-CM | POA: Diagnosis not present

## 2022-12-10 ENCOUNTER — Ambulatory Visit: Payer: PPO

## 2022-12-12 ENCOUNTER — Encounter: Payer: Self-pay | Admitting: Family Medicine

## 2022-12-12 ENCOUNTER — Encounter: Payer: Self-pay | Admitting: Cardiology

## 2022-12-16 ENCOUNTER — Other Ambulatory Visit (HOSPITAL_COMMUNITY): Payer: Self-pay | Admitting: Cardiology

## 2022-12-17 NOTE — Progress Notes (Signed)
Remote ICD transmission.   

## 2022-12-19 ENCOUNTER — Ambulatory Visit: Payer: PPO | Attending: Cardiology | Admitting: *Deleted

## 2022-12-19 DIAGNOSIS — I4819 Other persistent atrial fibrillation: Secondary | ICD-10-CM | POA: Diagnosis not present

## 2022-12-19 DIAGNOSIS — Z5181 Encounter for therapeutic drug level monitoring: Secondary | ICD-10-CM | POA: Diagnosis not present

## 2022-12-19 DIAGNOSIS — D6859 Other primary thrombophilia: Secondary | ICD-10-CM | POA: Diagnosis not present

## 2022-12-19 LAB — POCT INR: POC INR: 2.3

## 2022-12-19 NOTE — Patient Instructions (Signed)
Description   Continue warfarin 1/2 tablet daily except 1 tablet on Mondays and Thursdays Continue greens Recheck in 5 wks

## 2022-12-24 ENCOUNTER — Ambulatory Visit (HOSPITAL_COMMUNITY)
Admission: RE | Admit: 2022-12-24 | Discharge: 2022-12-24 | Disposition: A | Discharge status: Home / Self Care | Payer: PPO | Source: Ambulatory Visit | Attending: Interventional Radiology | Admitting: Interventional Radiology

## 2022-12-26 ENCOUNTER — Encounter: Payer: Self-pay | Admitting: Cardiology

## 2023-01-01 ENCOUNTER — Ambulatory Visit: Payer: PPO | Admitting: Family Medicine

## 2023-01-14 ENCOUNTER — Ambulatory Visit: Payer: PPO | Admitting: Family Medicine

## 2023-01-15 ENCOUNTER — Other Ambulatory Visit (HOSPITAL_COMMUNITY): Payer: Self-pay | Admitting: Cardiology

## 2023-01-15 DIAGNOSIS — I5042 Chronic combined systolic (congestive) and diastolic (congestive) heart failure: Secondary | ICD-10-CM

## 2023-01-17 DIAGNOSIS — E119 Type 2 diabetes mellitus without complications: Secondary | ICD-10-CM | POA: Diagnosis not present

## 2023-01-17 DIAGNOSIS — Z6833 Body mass index (BMI) 33.0-33.9, adult: Secondary | ICD-10-CM | POA: Diagnosis not present

## 2023-01-17 DIAGNOSIS — F411 Generalized anxiety disorder: Secondary | ICD-10-CM | POA: Diagnosis not present

## 2023-01-17 DIAGNOSIS — M129 Arthropathy, unspecified: Secondary | ICD-10-CM | POA: Diagnosis not present

## 2023-01-17 DIAGNOSIS — R5383 Other fatigue: Secondary | ICD-10-CM | POA: Diagnosis not present

## 2023-01-17 DIAGNOSIS — M549 Dorsalgia, unspecified: Secondary | ICD-10-CM | POA: Diagnosis not present

## 2023-01-17 DIAGNOSIS — Z0130 Encounter for examination of blood pressure without abnormal findings: Secondary | ICD-10-CM | POA: Diagnosis not present

## 2023-01-17 DIAGNOSIS — I10 Essential (primary) hypertension: Secondary | ICD-10-CM | POA: Diagnosis not present

## 2023-01-17 DIAGNOSIS — F1120 Opioid dependence, uncomplicated: Secondary | ICD-10-CM | POA: Diagnosis not present

## 2023-01-17 DIAGNOSIS — Z79899 Other long term (current) drug therapy: Secondary | ICD-10-CM | POA: Diagnosis not present

## 2023-01-17 DIAGNOSIS — M542 Cervicalgia: Secondary | ICD-10-CM | POA: Diagnosis not present

## 2023-01-22 DIAGNOSIS — Z79899 Other long term (current) drug therapy: Secondary | ICD-10-CM | POA: Diagnosis not present

## 2023-01-23 ENCOUNTER — Ambulatory Visit: Payer: PPO | Attending: Cardiology | Admitting: *Deleted

## 2023-01-23 DIAGNOSIS — D6859 Other primary thrombophilia: Secondary | ICD-10-CM | POA: Diagnosis not present

## 2023-01-23 DIAGNOSIS — Z5181 Encounter for therapeutic drug level monitoring: Secondary | ICD-10-CM

## 2023-01-23 DIAGNOSIS — I4819 Other persistent atrial fibrillation: Secondary | ICD-10-CM | POA: Diagnosis not present

## 2023-01-23 LAB — POCT INR: INR: 1.8 — AB (ref 2.0–3.0)

## 2023-01-23 NOTE — Patient Instructions (Signed)
Take warfarin 1 1/2 tablets tonight then resume 1/2 tablet daily except 1 tablet on Mondays and Thursdays Continue greens Recheck in 4 wks

## 2023-01-25 DIAGNOSIS — F411 Generalized anxiety disorder: Secondary | ICD-10-CM | POA: Diagnosis not present

## 2023-01-25 DIAGNOSIS — Z0130 Encounter for examination of blood pressure without abnormal findings: Secondary | ICD-10-CM | POA: Diagnosis not present

## 2023-01-25 DIAGNOSIS — Z6833 Body mass index (BMI) 33.0-33.9, adult: Secondary | ICD-10-CM | POA: Diagnosis not present

## 2023-01-25 DIAGNOSIS — M549 Dorsalgia, unspecified: Secondary | ICD-10-CM | POA: Diagnosis not present

## 2023-01-25 DIAGNOSIS — Z23 Encounter for immunization: Secondary | ICD-10-CM | POA: Diagnosis not present

## 2023-01-25 DIAGNOSIS — I10 Essential (primary) hypertension: Secondary | ICD-10-CM | POA: Diagnosis not present

## 2023-01-25 DIAGNOSIS — M47816 Spondylosis without myelopathy or radiculopathy, lumbar region: Secondary | ICD-10-CM | POA: Diagnosis not present

## 2023-01-25 DIAGNOSIS — F1120 Opioid dependence, uncomplicated: Secondary | ICD-10-CM | POA: Diagnosis not present

## 2023-01-25 DIAGNOSIS — E119 Type 2 diabetes mellitus without complications: Secondary | ICD-10-CM | POA: Diagnosis not present

## 2023-01-25 DIAGNOSIS — Z79899 Other long term (current) drug therapy: Secondary | ICD-10-CM | POA: Diagnosis not present

## 2023-02-06 ENCOUNTER — Encounter (HOSPITAL_COMMUNITY): Payer: Self-pay | Admitting: *Deleted

## 2023-02-11 ENCOUNTER — Encounter: Payer: Self-pay | Admitting: Cardiology

## 2023-02-16 ENCOUNTER — Other Ambulatory Visit: Payer: Self-pay | Admitting: Cardiology

## 2023-02-17 NOTE — Telephone Encounter (Signed)
Refill request for warfarin:  Last INR was 1.8 on 01/23/23 Next INR due 02/20/23 LOV was 10/01/22  Refill approved.

## 2023-02-24 ENCOUNTER — Other Ambulatory Visit: Payer: Self-pay | Admitting: Cardiology

## 2023-02-25 DIAGNOSIS — M47816 Spondylosis without myelopathy or radiculopathy, lumbar region: Secondary | ICD-10-CM | POA: Diagnosis not present

## 2023-02-25 DIAGNOSIS — I10 Essential (primary) hypertension: Secondary | ICD-10-CM | POA: Diagnosis not present

## 2023-02-25 DIAGNOSIS — F411 Generalized anxiety disorder: Secondary | ICD-10-CM | POA: Diagnosis not present

## 2023-02-25 DIAGNOSIS — Z6833 Body mass index (BMI) 33.0-33.9, adult: Secondary | ICD-10-CM | POA: Diagnosis not present

## 2023-02-25 DIAGNOSIS — M549 Dorsalgia, unspecified: Secondary | ICD-10-CM | POA: Diagnosis not present

## 2023-02-25 DIAGNOSIS — E119 Type 2 diabetes mellitus without complications: Secondary | ICD-10-CM | POA: Diagnosis not present

## 2023-02-25 DIAGNOSIS — Z79899 Other long term (current) drug therapy: Secondary | ICD-10-CM | POA: Diagnosis not present

## 2023-02-25 DIAGNOSIS — Z0130 Encounter for examination of blood pressure without abnormal findings: Secondary | ICD-10-CM | POA: Diagnosis not present

## 2023-02-27 ENCOUNTER — Ambulatory Visit: Payer: PPO | Attending: Cardiology | Admitting: *Deleted

## 2023-02-27 ENCOUNTER — Encounter: Payer: Self-pay | Admitting: Radiology

## 2023-02-27 DIAGNOSIS — Z5181 Encounter for therapeutic drug level monitoring: Secondary | ICD-10-CM

## 2023-02-27 DIAGNOSIS — D6859 Other primary thrombophilia: Secondary | ICD-10-CM | POA: Diagnosis not present

## 2023-02-27 DIAGNOSIS — I4819 Other persistent atrial fibrillation: Secondary | ICD-10-CM

## 2023-02-27 LAB — POCT INR: POC INR: 2.3

## 2023-02-27 NOTE — Patient Instructions (Signed)
Description   Continue same dose 1/2 tablet daily except 1 tablet on Mondays and Thursdays Continue greens Recheck in 5 wks

## 2023-02-28 ENCOUNTER — Other Ambulatory Visit: Payer: Self-pay | Admitting: *Deleted

## 2023-02-28 ENCOUNTER — Ambulatory Visit: Payer: PPO

## 2023-02-28 ENCOUNTER — Encounter: Payer: Self-pay | Admitting: *Deleted

## 2023-02-28 MED ORDER — ENTRESTO 24-26 MG PO TABS: 1.0000 | ORAL_TABLET | Freq: Two times a day (BID) | ORAL | 3 refills | Status: DC

## 2023-02-28 NOTE — Progress Notes (Signed)
Fax notification received from [employer] that entresto 24/26 mg BID approved 02/28/2023 through 12/30/2023.

## 2023-03-03 DIAGNOSIS — Z79899 Other long term (current) drug therapy: Secondary | ICD-10-CM | POA: Diagnosis not present

## 2023-03-12 ENCOUNTER — Ambulatory Visit (INDEPENDENT_AMBULATORY_CARE_PROVIDER_SITE_OTHER): Payer: PPO

## 2023-03-12 DIAGNOSIS — I255 Ischemic cardiomyopathy: Secondary | ICD-10-CM | POA: Diagnosis not present

## 2023-03-14 LAB — CUP PACEART REMOTE DEVICE CHECK
Battery Remaining Longevity: 90 mo
Battery Remaining Percentage: 100 %
Brady Statistic RA Percent Paced: 7 %
Brady Statistic RV Percent Paced: 3 %
Date Time Interrogation Session: 20240313093100
HighPow Impedance: 55 Ohm
Implantable Lead Connection Status: 753985
Implantable Lead Connection Status: 753985
Implantable Lead Implant Date: 20081119
Implantable Lead Implant Date: 20081119
Implantable Lead Location: 753859
Implantable Lead Location: 753860
Implantable Lead Model: 185
Implantable Lead Model: 4136
Implantable Lead Serial Number: 211124
Implantable Lead Serial Number: 28383796
Implantable Pulse Generator Implant Date: 20170303
Lead Channel Impedance Value: 516 Ohm
Lead Channel Impedance Value: 597 Ohm
Lead Channel Pacing Threshold Amplitude: 0.7 V
Lead Channel Pacing Threshold Amplitude: 1.2 V
Lead Channel Pacing Threshold Pulse Width: 0.5 ms
Lead Channel Pacing Threshold Pulse Width: 0.5 ms
Lead Channel Setting Pacing Amplitude: 2 V
Lead Channel Setting Pacing Amplitude: 2.4 V
Lead Channel Setting Pacing Pulse Width: 0.5 ms
Lead Channel Setting Sensing Sensitivity: 0.6 mV
Pulse Gen Serial Number: 204398

## 2023-03-17 ENCOUNTER — Other Ambulatory Visit (HOSPITAL_COMMUNITY): Payer: Self-pay | Admitting: Cardiology

## 2023-03-17 DIAGNOSIS — I5042 Chronic combined systolic (congestive) and diastolic (congestive) heart failure: Secondary | ICD-10-CM

## 2023-03-21 ENCOUNTER — Encounter: Payer: Self-pay | Admitting: Cardiology

## 2023-03-24 DIAGNOSIS — Z79899 Other long term (current) drug therapy: Secondary | ICD-10-CM | POA: Diagnosis not present

## 2023-03-24 DIAGNOSIS — M47816 Spondylosis without myelopathy or radiculopathy, lumbar region: Secondary | ICD-10-CM | POA: Diagnosis not present

## 2023-03-24 DIAGNOSIS — Z6834 Body mass index (BMI) 34.0-34.9, adult: Secondary | ICD-10-CM | POA: Diagnosis not present

## 2023-03-24 DIAGNOSIS — Z0130 Encounter for examination of blood pressure without abnormal findings: Secondary | ICD-10-CM | POA: Diagnosis not present

## 2023-03-24 DIAGNOSIS — F411 Generalized anxiety disorder: Secondary | ICD-10-CM | POA: Diagnosis not present

## 2023-03-24 DIAGNOSIS — M549 Dorsalgia, unspecified: Secondary | ICD-10-CM | POA: Diagnosis not present

## 2023-03-24 DIAGNOSIS — I10 Essential (primary) hypertension: Secondary | ICD-10-CM | POA: Diagnosis not present

## 2023-03-24 DIAGNOSIS — E119 Type 2 diabetes mellitus without complications: Secondary | ICD-10-CM | POA: Diagnosis not present

## 2023-03-24 DIAGNOSIS — N1831 Chronic kidney disease, stage 3a: Secondary | ICD-10-CM | POA: Diagnosis not present

## 2023-03-31 ENCOUNTER — Other Ambulatory Visit (HOSPITAL_COMMUNITY): Payer: Self-pay | Admitting: Interventional Radiology

## 2023-03-31 DIAGNOSIS — I671 Cerebral aneurysm, nonruptured: Secondary | ICD-10-CM

## 2023-04-03 ENCOUNTER — Ambulatory Visit: Payer: PPO | Attending: Cardiology | Admitting: *Deleted

## 2023-04-03 DIAGNOSIS — I4819 Other persistent atrial fibrillation: Secondary | ICD-10-CM | POA: Diagnosis not present

## 2023-04-03 DIAGNOSIS — Z5181 Encounter for therapeutic drug level monitoring: Secondary | ICD-10-CM

## 2023-04-03 DIAGNOSIS — D6859 Other primary thrombophilia: Secondary | ICD-10-CM | POA: Diagnosis not present

## 2023-04-03 LAB — POCT INR: INR: 2.4 (ref 2.0–3.0)

## 2023-04-03 NOTE — Patient Instructions (Signed)
Continue same dose 1/2 tablet daily except 1 tablet on Mondays and Thursdays Continue greens Recheck in 4 wks

## 2023-04-14 ENCOUNTER — Ambulatory Visit (HOSPITAL_COMMUNITY)
Admission: RE | Admit: 2023-04-14 | Discharge: 2023-04-14 | Disposition: A | Discharge status: Home / Self Care | Payer: PPO | Source: Ambulatory Visit | Attending: Interventional Radiology | Admitting: Interventional Radiology

## 2023-04-14 DIAGNOSIS — H5789 Other specified disorders of eye and adnexa: Secondary | ICD-10-CM | POA: Diagnosis not present

## 2023-04-14 DIAGNOSIS — K3189 Other diseases of stomach and duodenum: Secondary | ICD-10-CM | POA: Diagnosis not present

## 2023-04-14 DIAGNOSIS — I671 Cerebral aneurysm, nonruptured: Secondary | ICD-10-CM

## 2023-04-15 ENCOUNTER — Other Ambulatory Visit (HOSPITAL_COMMUNITY): Payer: Self-pay | Admitting: Cardiology

## 2023-04-16 HISTORY — PX: IR RADIOLOGIST EVAL & MGMT: IMG5224

## 2023-04-17 ENCOUNTER — Emergency Department (HOSPITAL_COMMUNITY)
Admission: EM | Admit: 2023-04-17 | Discharge: 2023-04-17 | Disposition: A | Discharge status: Home / Self Care | Payer: PPO | Attending: Emergency Medicine | Admitting: Emergency Medicine

## 2023-04-17 ENCOUNTER — Encounter (HOSPITAL_COMMUNITY): Payer: Self-pay

## 2023-04-17 ENCOUNTER — Emergency Department (HOSPITAL_COMMUNITY): Payer: PPO

## 2023-04-17 ENCOUNTER — Other Ambulatory Visit: Payer: Self-pay

## 2023-04-17 DIAGNOSIS — R079 Chest pain, unspecified: Secondary | ICD-10-CM | POA: Diagnosis not present

## 2023-04-17 DIAGNOSIS — R0602 Shortness of breath: Secondary | ICD-10-CM | POA: Diagnosis not present

## 2023-04-17 DIAGNOSIS — Z7901 Long term (current) use of anticoagulants: Secondary | ICD-10-CM | POA: Insufficient documentation

## 2023-04-17 DIAGNOSIS — I110 Hypertensive heart disease with heart failure: Secondary | ICD-10-CM | POA: Insufficient documentation

## 2023-04-17 DIAGNOSIS — Z1152 Encounter for screening for COVID-19: Secondary | ICD-10-CM | POA: Insufficient documentation

## 2023-04-17 DIAGNOSIS — Z79899 Other long term (current) drug therapy: Secondary | ICD-10-CM | POA: Insufficient documentation

## 2023-04-17 DIAGNOSIS — I509 Heart failure, unspecified: Secondary | ICD-10-CM | POA: Insufficient documentation

## 2023-04-17 DIAGNOSIS — R0789 Other chest pain: Secondary | ICD-10-CM | POA: Diagnosis not present

## 2023-04-17 DIAGNOSIS — I2510 Atherosclerotic heart disease of native coronary artery without angina pectoris: Secondary | ICD-10-CM | POA: Insufficient documentation

## 2023-04-17 LAB — BASIC METABOLIC PANEL
Anion gap: 11 (ref 5–15)
BUN: 10 mg/dL (ref 6–20)
CO2: 25 mmol/L (ref 22–32)
Calcium: 8.4 mg/dL — ABNORMAL LOW (ref 8.9–10.3)
Chloride: 94 mmol/L — ABNORMAL LOW (ref 98–111)
Creatinine, Ser: 1.08 mg/dL — ABNORMAL HIGH (ref 0.44–1.00)
GFR, Estimated: 59 mL/min — ABNORMAL LOW (ref >60)
Glucose, Bld: 84 mg/dL (ref 70–99)
Potassium: 4.7 mmol/L (ref 3.5–5.1)
Sodium: 130 mmol/L — ABNORMAL LOW (ref 135–145)

## 2023-04-17 LAB — RESP PANEL BY RT-PCR (RSV, FLU A&B, COVID)  RVPGX2
Influenza A by PCR: NEGATIVE
Influenza B by PCR: NEGATIVE
Resp Syncytial Virus by PCR: NEGATIVE
SARS Coronavirus 2 by RT PCR: NEGATIVE

## 2023-04-17 LAB — CBC
HCT: 43.6 % (ref 36.0–46.0)
Hemoglobin: 14.4 g/dL (ref 12.0–15.0)
MCH: 30.2 pg (ref 26.0–34.0)
MCHC: 33 g/dL (ref 30.0–36.0)
MCV: 91.4 fL (ref 80.0–100.0)
Platelets: 119 10*3/uL — ABNORMAL LOW (ref 150–400)
RBC: 4.77 MIL/uL (ref 3.87–5.11)
RDW: 13.2 % (ref 11.5–15.5)
WBC: 6.9 10*3/uL (ref 4.0–10.5)
nRBC: 0 % (ref 0.0–0.2)

## 2023-04-17 LAB — TROPONIN I (HIGH SENSITIVITY)
Troponin I (High Sensitivity): 11 ng/L (ref <18)
Troponin I (High Sensitivity): 10 ng/L (ref <18)

## 2023-04-17 LAB — BRAIN NATRIURETIC PEPTIDE: B Natriuretic Peptide: 184.9 pg/mL — ABNORMAL HIGH (ref 0.0–100.0)

## 2023-04-17 NOTE — Discharge Instructions (Signed)
Cardiac work-up today was reassuring.   Covid/flu testing also negative. Continue your usual medications. Can try coricidin (white box with heart on it) if you feel worsening congestion and want to try something. Follow-up with your primary care doctor.  Can also follow-up with cardiology. Return here for ANY new/acute changes.

## 2023-04-17 NOTE — ED Notes (Signed)
Pt is resting, vitals stable.

## 2023-04-17 NOTE — ED Triage Notes (Signed)
Pt states that she has been having SOB and CP over the past several weeks along with CHF, recent weight gain. Denies n/v. Pt has ICD

## 2023-04-17 NOTE — ED Provider Notes (Signed)
[place] EMERGENCY DEPARTMENT AT [facility] Provider Note   CSN: [id number] Arrival date & time: 04/17/23  0012     History  Chief Complaint  Patient presents with   Shortness of Breath    [name] [name] is a 62 y.o. female.  The history is provided by medical records and the patient.  Shortness of Breath Associated symptoms: cough    57 female with history of CHF, CAD, hypertension, GERD, hyperlipidemia, PTSD, history of VT with ICD in place, presenting to the ED with chest pain and shortness of breath over the past few days.  States over the past 2 weeks she has gained approx 9lbs, feels like it is just fluid.  She reports chest tightness but denies chest pain/pressure.  She reports dry cough and hoarse voice.  No fever/chills.  No sick contacts.  She denies any increased night time orthopnea (sleeps at incline due to rod in her back) or increased LE edema.  She has ICD in place, no shocks. Cardiology-- Dr. Shirlee Latch, EP-- Dr. Ladona Ridgel   Home Medications Prior to Admission medications   Medication Sig Start Date End Date Taking? Authorizing Provider  ALPRAZolam (XANAX) 0.25 MG tablet Take 1 tablet (0.25 mg total) by mouth daily as needed for anxiety. 10/09/21   [name], FNP  amiodarone (PACERONE) 200 MG tablet Take 1.5 tablets by mouth Monday through Thursday.  Take 2 tablets by mouth Friday through Sunday. 10/31/22   [name], MD  carvedilol (COREG) 12.5 MG tablet TAKE 1 TABLET BY MOUTH TWICE DAILY 03/17/23   [name], MD  DULoxetine (CYMBALTA) 60 MG capsule Take 60 mg by mouth daily.    [provider]  EPINEPHRINE 0.3 mg/0.3 mL IJ SOAJ injection USE AS DIRECTED 05/02/22   [name] F, FNP  ezetimibe (ZETIA) 10 MG tablet TAKE 1 TABLET BY MOUTH EVERY DAY 12/16/22   [name], MD  fluticasone [facility]) 50 MCG/ACT nasal spray Place 2 sprays into both nostrils daily. 04/04/22   [name], FNP  isosorbide mononitrate (IMDUR) 30 MG  24 hr tablet TAKE 1 TABLET BY MOUTH DAILY 06/12/22   [name], MD  JARDIANCE 10 MG TABS tablet TAKE 1 TABLET BY MOUTH DAILY BEFORE breakfast 01/15/23   [name], MD  mexiletine (MEXITIL) 150 MG capsule TAKE TWO CAPSULES BY MOUTH THREE TIMES DAILY 10/14/22   [name], MD  Multiple Vitamins-Minerals (MULTIVITAMINS THER. W/MINERALS) TABS Take 1 tablet by mouth daily.      [provider]  nitroGLYCERIN (NITROSTAT) 0.4 MG SL tablet PLACE 1 TABLET UNDER THE TONGUE EVERY 5 MINUTES FOR 3 DOSES AS NEEDED CHEST PAIN 02/24/23   [name], MD  oxyCODONE-acetaminophen (PERCOCET) 10-325 MG tablet Take 1 tablet by mouth every 4 (four) hours as needed for pain.    [provider]  pantoprazole (PROTONIX) 40 MG tablet TAKE 1 TABLET BY MOUTH DAILY 03/17/23   [name], MD  potassium chloride SA (KLOR-CON M) 20 MEQ tablet TAKE 2 TABLETS BY MOUTH EVERY MORNING and TAKE 1 TABLET EVERY EVENING 10/25/22   [name], MD  pravastatin (PRAVACHOL) 20 MG tablet Take 1 tablet (20 mg total) by mouth every evening. 03/12/22   [name], MD  sacubitril-valsartan (ENTRESTO) 24-26 MG Take 1 tablet by mouth 2 (two) times daily. 02/28/23   [name], MD  sennosides-docusate sodium (SENOKOT-S) 8.6-50 MG tablet Take 1 tablet by mouth daily as  needed.    [provider]  spironolactone (ALDACTONE) 25 MG tablet TAKE 1 TABLET BY MOUTH EVERY DAY 06/12/22   [name], MD  torsemide (DEMADEX) 20 MG tablet TAKE 4 TABLETS BY MOUTH DAILY (needs FOLLOWING UP APPOINTMENT FOR more refills) 04/15/23   [name], MD  traZODone (DESYREL) 100 MG tablet Take 100 mg by mouth at bedtime as needed for sleep.    [provider]  warfarin (COUMADIN) 5 MG tablet Take 1/2 TO 1 tablet daily OR as directed by coumadin clinic 02/17/23   [name], MD      Allergies    Beef-derived products, Metoclopramide hcl, Penicillins, Pork-derived products,  Metoprolol, and Statins    Review of Systems   Review of Systems  Respiratory:  Positive for cough and shortness of breath.   All other systems reviewed and are negative.   Physical Exam Updated Vital Signs BP (!) 111/56 (BP Location: Right Arm)   Pulse 62   Temp 98.6 F (37 C) (Oral)   Resp 16   SpO2 100%   Physical Exam Vitals and nursing note reviewed.  Constitutional:      Appearance: She is well-developed.  HENT:     Head: Normocephalic and atraumatic.     Mouth/Throat:     Comments: Voice sounds hoarse/dry Eyes:     Conjunctiva/sclera: Conjunctivae normal.     Pupils: Pupils are equal, round, and reactive to light.  Cardiovascular:     Rate and Rhythm: Normal rate and regular rhythm.     Heart sounds: Normal heart sounds.  Pulmonary:     Effort: Pulmonary effort is normal.     Breath sounds: Normal breath sounds.     Comments: Dry cough noted but lung sounds are clear, no acute distress Abdominal:     General: Bowel sounds are normal.     Palpations: Abdomen is soft.  Musculoskeletal:        General: Normal range of motion.     Cervical back: Normal range of motion.     Comments: No LE edema  Skin:    General: Skin is warm and dry.  Neurological:     Mental Status: She is alert and oriented to person, place, and time.     ED Results / Procedures / Treatments   Labs (all labs ordered are listed, but only abnormal results are displayed) Labs Reviewed  BASIC METABOLIC PANEL - Abnormal; Notable for the following components:      Result Value   Sodium 130 (*)    Chloride 94 (*)    Creatinine, Ser 1.08 (*)    Calcium 8.4 (*)    GFR, Estimated 59 (*)    All other components within normal limits  CBC - Abnormal; Notable for the following components:   Platelets 119 (*)    All other components within normal limits  BRAIN NATRIURETIC PEPTIDE - Abnormal; Notable for the following components:   B Natriuretic Peptide 184.9 (*)    All other components within  normal limits  RESP PANEL BY RT-PCR (RSV, FLU A&B, COVID)  RVPGX2  TROPONIN I (HIGH SENSITIVITY)  TROPONIN I (HIGH SENSITIVITY)    EKG None  Radiology DG Chest 2 View  Result Date: 04/17/2023 CLINICAL DATA:  Reason for exam is chest pain EXAM: CHEST - 2 VIEW COMPARISON:  07/12/2021 FINDINGS: Stable cardiomediastinal silhouette. Aortic atherosclerotic calcification. Left chest wall pacemaker. Thoracic spine fusion hardware. Right mid and lower lung atelectasis or scarring. Otherwise no  focal consolidation. No pleural effusion or pneumothorax. No displaced rib fractures. IMPRESSION: No active cardiopulmonary disease. Electronically Signed   By: Minerva Fester M.D.   On: 04/17/2023 01:06    Procedures Procedures    Medications Ordered in ED Medications - No data to display  ED Course/ Medical Decision Making/ A&P                             Medical Decision Making Amount and/or Complexity of Data Reviewed Labs: ordered. Radiology: ordered and independent interpretation performed. ECG/medicine tests: ordered and independent interpretation performed.   62 year old female presenting to the ED with chest pain and shortness of breath.  Does have history of CHF and reports 9 pound weight gain over the past 2 weeks.  She also reports dry cough and hoarse voice.  No sick contacts reported.  EKG without acute ischemic changes.  Labs are overall reassuring--no leukocytosis or electrolyte derangement.  Renal function appears at her baseline.  BNP 184 which is lower than priors.  Chest x-ray without acute findings, no vascular congestion or pulmonary edema.  Troponin is negative.  Given her history, patient was observed here and delta troponin obtained which remains negative.  RVP is also negative.  Suspect likely viral etiology.  Lower suspicion for ACS, PE (on coumadin, recent INR therapeutic at 2.4), dissection, other acute cardiac event.  Feel she is stable for discharge.  Will have her  continue her home medications and follow up closely with PCP.  She did inquire about OTC cold medication-- given her BP and cardiac hx, recommended coricidin to avoid any adverse reactions/side effects.  Encouraged to return here for any new/acute changes.  Final Clinical Impression(s) / ED Diagnoses Final diagnoses:  Chest pain in adult  Shortness of breath    Rx / DC Orders ED Discharge Orders     None         Garlon Hatchet, PA-C 04/17/23 0547    Marily Memos, MD 04/17/23 [phone]

## 2023-04-17 NOTE — Progress Notes (Signed)
Remote ICD transmission.   

## 2023-04-22 ENCOUNTER — Other Ambulatory Visit (HOSPITAL_COMMUNITY): Payer: Self-pay | Admitting: Interventional Radiology

## 2023-04-22 DIAGNOSIS — N1831 Chronic kidney disease, stage 3a: Secondary | ICD-10-CM | POA: Diagnosis not present

## 2023-04-22 DIAGNOSIS — I10 Essential (primary) hypertension: Secondary | ICD-10-CM | POA: Diagnosis not present

## 2023-04-22 DIAGNOSIS — F411 Generalized anxiety disorder: Secondary | ICD-10-CM | POA: Diagnosis not present

## 2023-04-22 DIAGNOSIS — M47816 Spondylosis without myelopathy or radiculopathy, lumbar region: Secondary | ICD-10-CM | POA: Diagnosis not present

## 2023-04-22 DIAGNOSIS — Z79899 Other long term (current) drug therapy: Secondary | ICD-10-CM | POA: Diagnosis not present

## 2023-04-22 DIAGNOSIS — I671 Cerebral aneurysm, nonruptured: Secondary | ICD-10-CM

## 2023-04-22 DIAGNOSIS — M549 Dorsalgia, unspecified: Secondary | ICD-10-CM | POA: Diagnosis not present

## 2023-04-22 DIAGNOSIS — Z0130 Encounter for examination of blood pressure without abnormal findings: Secondary | ICD-10-CM | POA: Diagnosis not present

## 2023-04-22 DIAGNOSIS — G8929 Other chronic pain: Secondary | ICD-10-CM | POA: Diagnosis not present

## 2023-04-22 DIAGNOSIS — Z6834 Body mass index (BMI) 34.0-34.9, adult: Secondary | ICD-10-CM | POA: Diagnosis not present

## 2023-04-23 ENCOUNTER — Telehealth: Payer: Self-pay | Admitting: Cardiology

## 2023-04-23 NOTE — Telephone Encounter (Addendum)
   Pre-operative Risk Assessment    Patient Name: [name] [name]  DOB: October 08, 1961 MRN: [id number]      Request for Surgical Clearance    Procedure:   diagnostic aniogram  Date of Surgery:  Clearance TBD                                 Surgeon:  DR Julieanne Cotton Surgeon's Group or Practice Name:  [facility] Phone number:  [phone]  Fax number:  [phone]   Type of Clearance Requested:   - Pharmacy:  Hold Warfarin (Coumadin)     Type of Anesthesia:   MODERATE SEDATION   Additional requests/questions:    Signed, Vallarie Mare   04/23/2023, 1:33 PM

## 2023-04-23 NOTE — Telephone Encounter (Signed)
Patient with diagnosis of atrial fibrillation and antiphospholipid syndrome on warfarin for anticoagulation.    Procedure: diagnostic angiogram Date of procedure: TBD   CHA2DS2-VASc Score = 6   This indicates a 9.7% annual risk of stroke. The patient's score is based upon: CHF History: 1 HTN History: 1 Diabetes History: 0 Stroke History: 2 Vascular Disease History: 1 Age Score: 0 Gender Score: 1   CrCl 81 Platelet count 119  Per office protocol, patient can hold warfarin for 5 days prior to procedure.   Patient WILL need bridging with Lovenox (enoxaparin) around procedure.  Patient next INR on 05/01/23 at [facility] office.  Noted on appointment that patient will need to be bridged.  If procedure before May 10, they will need to reach out to [name] at the [place] clinic to move INR check to a sooner date   **This guidance is not considered finalized until pre-operative APP has relayed final recommendations.**

## 2023-04-24 ENCOUNTER — Telehealth (HOSPITAL_COMMUNITY): Payer: Self-pay

## 2023-04-24 NOTE — Telephone Encounter (Signed)
   Patient Name: [name] [name]  DOB: 1961-09-23 MRN: [id number]  Primary Cardiologist: Nona Dell, MD  Clinical pharmacists have reviewed the patient's past medical history, labs, and current medications as part of preoperative protocol coverage. The following recommendations have been made:   Patient with diagnosis of atrial fibrillation and antiphospholipid syndrome on warfarin for anticoagulation.     Procedure: diagnostic angiogram Date of procedure: TBD     CHA2DS2-VASc Score = 6   This indicates a 9.7% annual risk of stroke. The patient's score is based upon: CHF History: 1 HTN History: 1 Diabetes History: 0 Stroke History: 2 Vascular Disease History: 1 Age Score: 0 Gender Score: 1   CrCl 81 Platelet count 119   Per office protocol, patient can hold warfarin for 5 days prior to procedure.   Patient WILL need bridging with Lovenox (enoxaparin) around procedure.   Patient next INR on 05/01/23 at [facility] office.  Noted on appointment that patient will need to be bridged.  If procedure before May 10, they will need to reach out to [name] at the [place] clinic to move INR check to a sooner date   I will route this recommendation to the requesting party via Epic fax function and remove from pre-op pool.  Please call with questions.  Joylene Grapes, NP 04/24/2023, 11:52 AM

## 2023-04-24 NOTE — Telephone Encounter (Signed)
Called to let the pt know that I received a message from cardiology. She has an INR check on 5/2 and will also need to be bridged with Lovenox. I will wait until after her appt on 5/2 and schedule her for her angiogram. AB

## 2023-04-28 ENCOUNTER — Ambulatory Visit (INDEPENDENT_AMBULATORY_CARE_PROVIDER_SITE_OTHER): Payer: PPO | Admitting: Family

## 2023-04-28 ENCOUNTER — Encounter: Payer: Self-pay | Admitting: Family

## 2023-04-28 VITALS — HR 68 | Temp 97.9°F | Ht 66.0 in | Wt 207.0 lb

## 2023-04-28 DIAGNOSIS — J209 Acute bronchitis, unspecified: Secondary | ICD-10-CM

## 2023-04-28 MED ORDER — PROMETHAZINE-DM 6.25-15 MG/5ML PO SYRP
5.0000 mL | ORAL_SOLUTION | Freq: Three times a day (TID) | ORAL | 0 refills | Status: DC | PRN
Start: 2023-04-28 — End: 2023-06-30

## 2023-04-28 MED ORDER — BENZONATATE 200 MG PO CAPS
200.0000 mg | ORAL_CAPSULE | Freq: Three times a day (TID) | ORAL | 1 refills | Status: DC | PRN
Start: 2023-04-28 — End: 2023-06-30

## 2023-04-28 MED ORDER — PREDNISONE 10 MG (21) PO TBPK
ORAL_TABLET | ORAL | 0 refills | Status: DC
Start: 2023-04-28 — End: 2023-06-30

## 2023-04-28 MED ORDER — CETIRIZINE HCL 10 MG PO TABS
10.0000 mg | ORAL_TABLET | Freq: Every day | ORAL | 1 refills | Status: DC
Start: 2023-04-28 — End: 2024-02-23

## 2023-04-28 NOTE — Progress Notes (Signed)
Subjective:    Patient ID: [name] [name], female    DOB: Jun 16, 1961, 62 y.o.   MRN: [id number]  Chief Complaint  Patient presents with   Sinus Problem    Over week losing voice. Drainage. Left ear pain    Pt presents to the office today for sinus congestion.  Sinus Problem This is a new problem. The current episode started 1 to 4 weeks ago. The problem has been gradually worsening since onset. There has been no fever. Her pain is at a severity of 5/10. The pain is mild. Associated symptoms include congestion, coughing, ear pain (left), headaches, a hoarse voice, sneezing and a sore throat. Pertinent negatives include no sinus pressure. Treatments tried: zyrtec and flonase. The treatment provided mild relief.  Nicotine Dependence Presents for follow-up visit. Symptoms include sore throat. Her urge triggers include company of smokers. The symptoms have been stable. She smokes < 1/2 a pack of cigarettes per day.      Review of Systems  HENT:  Positive for congestion, ear pain (left), hoarse voice, sneezing and sore throat. Negative for sinus pressure.   Respiratory:  Positive for cough.   Neurological:  Positive for headaches.  All other systems reviewed and are negative.      Objective:   Physical Exam Vitals reviewed.  Constitutional:      General: She is not in acute distress.    Appearance: She is well-developed. She is obese.  HENT:     Head: Normocephalic and atraumatic.     Ears:     Comments: Right canal erythemas     Mouth/Throat:     Comments: Nasal congestion  Eyes:     Pupils: Pupils are equal, round, and reactive to light.  Neck:     Thyroid: No thyromegaly.  Cardiovascular:     Rate and Rhythm: Normal rate and regular rhythm.     Heart sounds: Normal heart sounds. No murmur heard. Pulmonary:     Effort: Pulmonary effort is normal. No respiratory distress.     Breath sounds: Rhonchi present. No wheezing.  Abdominal:     General: Bowel sounds are normal.  There is no distension.     Palpations: Abdomen is soft.     Tenderness: There is no abdominal tenderness.  Musculoskeletal:        General: No tenderness. Normal range of motion.     Cervical back: Normal range of motion and neck supple.  Skin:    General: Skin is warm and dry.  Neurological:     Mental Status: She is alert and oriented to person, place, and time.     Cranial Nerves: No cranial nerve deficit.     Deep Tendon Reflexes: Reflexes are normal and symmetric.  Psychiatric:        Behavior: Behavior normal.        Thought Content: Thought content normal.        Judgment: Judgment normal.       Pulse 68   Temp 97.9 F (36.6 C) (Temporal)   Ht 5\' 6"  (1.676 m)   Wt 207 lb (93.9 kg)   SpO2 93%   BMI 33.41 kg/m      Assessment & Plan:   SUSANNAH CARBIN comes in today with chief complaint of Sinus Problem (Over week losing voice. Drainage. Left ear pain )   Diagnosis and orders addressed:  1. Acute bronchitis, unspecified organism - Take meds as prescribed - Use a cool mist humidifier  -Use  saline nose sprays frequently -Force fluids -For any cough or congestion  Use plain Mucinex- regular strength or max strength is fine -For fever or aces or pains- take tylenol or ibuprofen. -Throat lozenges if help -Continue to decrease smoking and smoking cessation discussed Follow up if symptoms worsen or do not improve  - cetirizine (ZYRTEC ALLERGY) 10 MG tablet; Take 1 tablet (10 mg total) by mouth daily.  Dispense: 90 tablet; Refill: 1 - predniSONE (STERAPRED UNI-PAK 21 TAB) 10 MG (21) TBPK tablet; Use as directed  Dispense: 21 tablet; Refill: 0 - benzonatate (TESSALON) 200 MG capsule; Take 1 capsule (200 mg total) by mouth 3 (three) times daily as needed.  Dispense: 30 capsule; Refill: 1 - promethazine-dextromethorphan (PROMETHAZINE-DM) 6.25-15 MG/5ML syrup; Take 5 mLs by mouth 3 (three) times daily as needed for cough.  Dispense: 118 mL; Refill: 0  Jannifer Rodney,  FNP

## 2023-04-28 NOTE — Patient Instructions (Signed)

## 2023-05-05 ENCOUNTER — Ambulatory Visit: Payer: PPO | Attending: Cardiology | Admitting: *Deleted

## 2023-05-05 DIAGNOSIS — D6859 Other primary thrombophilia: Secondary | ICD-10-CM | POA: Diagnosis not present

## 2023-05-05 DIAGNOSIS — I4819 Other persistent atrial fibrillation: Secondary | ICD-10-CM | POA: Diagnosis not present

## 2023-05-05 DIAGNOSIS — Z5181 Encounter for therapeutic drug level monitoring: Secondary | ICD-10-CM

## 2023-05-05 LAB — POCT INR: INR: 1.8 — AB (ref 2.0–3.0)

## 2023-05-05 NOTE — Patient Instructions (Addendum)
Take warfarin 1 1/2 tablets tonight then continue 1/2 tablet daily except 1 tablet on Mondays and Thursdays Continue greens Recheck in 3 wks Pending angiogram.  Not scheduled yet,  Will hold warfarin and bridge with Lovenox  when date is determined.

## 2023-05-12 ENCOUNTER — Other Ambulatory Visit (HOSPITAL_COMMUNITY): Payer: Self-pay | Admitting: Cardiology

## 2023-05-12 DIAGNOSIS — I5042 Chronic combined systolic (congestive) and diastolic (congestive) heart failure: Secondary | ICD-10-CM

## 2023-05-20 DIAGNOSIS — I10 Essential (primary) hypertension: Secondary | ICD-10-CM | POA: Diagnosis not present

## 2023-05-20 DIAGNOSIS — M549 Dorsalgia, unspecified: Secondary | ICD-10-CM | POA: Diagnosis not present

## 2023-05-20 DIAGNOSIS — N1831 Chronic kidney disease, stage 3a: Secondary | ICD-10-CM | POA: Diagnosis not present

## 2023-05-20 DIAGNOSIS — Z6834 Body mass index (BMI) 34.0-34.9, adult: Secondary | ICD-10-CM | POA: Diagnosis not present

## 2023-05-20 DIAGNOSIS — Z79899 Other long term (current) drug therapy: Secondary | ICD-10-CM | POA: Diagnosis not present

## 2023-05-20 DIAGNOSIS — G8929 Other chronic pain: Secondary | ICD-10-CM | POA: Diagnosis not present

## 2023-05-20 DIAGNOSIS — Z0130 Encounter for examination of blood pressure without abnormal findings: Secondary | ICD-10-CM | POA: Diagnosis not present

## 2023-05-20 DIAGNOSIS — M47816 Spondylosis without myelopathy or radiculopathy, lumbar region: Secondary | ICD-10-CM | POA: Diagnosis not present

## 2023-05-20 DIAGNOSIS — F411 Generalized anxiety disorder: Secondary | ICD-10-CM | POA: Diagnosis not present

## 2023-05-22 ENCOUNTER — Ambulatory Visit: Payer: PPO | Attending: Cardiology | Admitting: *Deleted

## 2023-05-22 ENCOUNTER — Ambulatory Visit: Payer: PPO | Admitting: Family Medicine

## 2023-05-22 DIAGNOSIS — I679 Cerebrovascular disease, unspecified: Secondary | ICD-10-CM

## 2023-05-22 DIAGNOSIS — D6859 Other primary thrombophilia: Secondary | ICD-10-CM | POA: Diagnosis not present

## 2023-05-22 DIAGNOSIS — I4819 Other persistent atrial fibrillation: Secondary | ICD-10-CM | POA: Diagnosis not present

## 2023-05-22 DIAGNOSIS — Z5181 Encounter for therapeutic drug level monitoring: Secondary | ICD-10-CM | POA: Diagnosis not present

## 2023-05-22 DIAGNOSIS — D6861 Antiphospholipid syndrome: Secondary | ICD-10-CM

## 2023-05-22 LAB — POCT INR: INR: 2.6 (ref 2.0–3.0)

## 2023-05-22 MED ORDER — ENOXAPARIN SODIUM 100 MG/ML IJ SOSY
100.0000 mg | PREFILLED_SYRINGE | Freq: Two times a day (BID) | INTRAMUSCULAR | 0 refills | Status: DC
Start: 2023-05-22 — End: 2023-06-30

## 2023-05-23 ENCOUNTER — Other Ambulatory Visit (HOSPITAL_COMMUNITY): Payer: Self-pay | Admitting: Cardiology

## 2023-05-27 LAB — POCT INR: INR: 2.6 (ref 2.0–3.0)

## 2023-05-27 NOTE — Patient Instructions (Addendum)
05/29/23 Angiogram  5/24  Last dose of warfarin 5/25  No lovenox or warfarin 5/26 - 5/28  Lovenox 100mg  twice daily am and pm 5/29  Lovenox 100mg  sq am ----NO lovenox in pm 5/30  No lovenox in am or pm ---------procedure----------warfarin 5mg  pm 5/31 - 6/1  lovenox 100mg  am and pm ---------- warfarin 5mg  pm 6/2  Lovenox 100mg  am & pm ------warfarin 2.5mg  in pm 6/3  Lovenox 100mg  am & pm --------warfarin 5mg  pm 6/4 Lovenox 100mg  am ------INR appt at 11am  Lovenox 100mg  syringes   #20 sent to [facility] drug

## 2023-05-27 NOTE — H&P (Signed)
Chief Complaint: Patient was seen in consultation today for peri-ophthalmic aneurysm  Referring Physician(s): Deveshwar,Sanjeev  Supervising Physician: {Supervising Physician:[id number]}  Patient Status: [facility] - Out-pt  History of Present Illness: [name] [name] is a 62 y.o. female with a medical history significant for CHF, CAD, HTN, PTSD, V-tach with ICD in place, persistent atrial fibrillation (coumadin), antiphospholipid syndrome, PVD, stroke, right internal carotid artery occlusion and internal carotid artery aneurysm. She was referred to Neuro Interventional Radiology in 2016 for new onset left-sided numbness and tingling with intermittent weakness. She underwent  a diagnostic cerebral angiogram 11/03/2015 and at that time had additional complaints of intermittent headaches and right-sided partial amaurosis fugax with blurred vision. 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 At that time it was felt that endovascular treatment of this aneurysm was not possible and she was referred for Neurosurgical consultation at [facility].   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    She followed up with Dr. Corliss Skains 04/14/23 for evaluation of new onset cloudiness in the right eye and worsening right temporoparietal headaches. The right eye cloudiness was likely due to amaurosis fugax with uncertain etiology - possibly from the occluded right ICA, the periophthalmic region aneurysm or possibly from a questionable outpouching in the right anterior cerebral A2 segment which could represent an aneurysm.   Dr. Corliss Skains discussed undergoing a repeat diagnostic cerebral angiogram versus conservative management with imaging and anticoagulation. The patient  requested to proceed with the diagnostic cerebral angiogram.    Past Medical History:  Diagnosis Date   Aneurysm of internal carotid artery    Anti-phospholipid syndrome (HCC)    Anticardiolipin antibody positive    Chronic Coumadin   Brain aneurysm    followed by Dr. Corliss Skains   Chronic combined systolic (congestive) and diastolic (congestive) heart failure (HCC)    Cocaine abuse in remission [facility])    Coronary atherosclerosis of native coronary artery    Previous invasive cardiac testing was done at a facility in [place], [place].  Occluded diagonal, Diffuse LAD disease, Occluded Cx, and non obstructive RCA.    Essential hypertension    Headache    History of pneumonia    ICD (implantable cardioverter-defibrillator) in place    Ischemic cardiomyopathy    LVEF 30-35%   Mixed hyperlipidemia    NSVT (nonsustained ventricular tachycardia) (HCC)    Persistent atrial fibrillation (HCC)    PVD (peripheral vascular disease) (HCC)    Raynaud's phenomenon    Statin intolerance    Stroke [facility]) 2007   Total occlusion of the right internal carotid    Past Surgical History:  Procedure Laterality Date   ABDOMINAL AORTAGRAM N/A 05/25/2014   Procedure: ABDOMINAL Ronny Flurry;  Surgeon: Iran Ouch, MD;  Location: [facility] CATH LAB;  Service: Cardiovascular;  Laterality: N/A;   APPENDECTOMY     BACK SURGERY  2010   Dr. Dutch Quint L1 cage, 5 disc fusion   CARDIAC DEFIBRILLATOR PLACEMENT  2008   [employer] ICD   CARDIOVERSION N/A 12/01/2020   Procedure: CARDIOVERSION;  Surgeon: [name], MD;  Location: [facility] ENDOSCOPY;  Service: Cardiovascular;  Laterality: N/A;   CORONARY STENT INTERVENTION N/A 01/08/2021   Procedure: CORONARY STENT INTERVENTION;  Surgeon: Swaziland, Peter M, MD;  Location: [facility] INVASIVE CV  LAB;  Service: Cardiovascular;  Laterality: N/A;   EP IMPLANTABLE DEVICE N/A 03/01/2016   Procedure: ICD Generator Changeout;  Surgeon: [name], MD;  Location: [facility] INVASIVE CV LAB;   Service: Cardiovascular;  Laterality: N/A;   ICD/BIV ICD FUNCTION(DFT) TEST N/A 03/14/2021   Procedure: ICD/BIV ICD FUNCTION (DFT) TEST;  Surgeon: [name], MD;  Location: [facility] INVASIVE CV LAB;  Service: Cardiovascular;  Laterality: N/A;   IR RADIOLOGIST EVAL & MGMT  04/16/2023   L1 corpectomy   11/29/2009   Laryngeal polyp excision     RIGHT/LEFT HEART CATH AND CORONARY ANGIOGRAPHY N/A 12/18/2020   Procedure: RIGHT/LEFT HEART CATH AND CORONARY ANGIOGRAPHY;  Surgeon: Dolores Patty, MD;  Location: [facility] INVASIVE CV LAB;  Service: Cardiovascular;  Laterality: N/A;   RIGHT/LEFT HEART CATH AND CORONARY ANGIOGRAPHY N/A 03/05/2021   Procedure: RIGHT/LEFT HEART CATH AND CORONARY ANGIOGRAPHY;  Surgeon: [name], MD;  Location: [facility] INVASIVE CV LAB;  Service: Cardiovascular;  Laterality: N/A;   TEE WITHOUT CARDIOVERSION N/A 12/01/2020   Procedure: TRANSESOPHAGEAL ECHOCARDIOGRAM (TEE);  Surgeon: [name], MD;  Location: [facility] ENDOSCOPY;  Service: Cardiovascular;  Laterality: N/AFloyce Stakes ABLATION N/A 03/08/2021   Procedure: Floyce Stakes ABLATION;  Surgeon: Lanier Prude, MD;  Location: [facility] INVASIVE CV LAB;  Service: Cardiovascular;  Laterality: N/A;    Allergies: Beef-derived products, Metoclopramide hcl, Penicillins, Pork-derived products, Metoprolol, and Statins  Medications: Prior to Admission medications   Medication Sig Start Date End Date Taking? Authorizing Provider  ALPRAZolam (XANAX) 0.25 MG tablet Take 1 tablet (0.25 mg total) by mouth daily as needed for anxiety. 10/09/21   [name], FNP  amiodarone (PACERONE) 200 MG tablet Take 1.5 tablets by mouth Monday through Thursday.  Take 2 tablets by mouth Friday through Sunday. 10/31/22   [name], MD  benzonatate (TESSALON) 200 MG capsule Take 1 capsule (200 mg total) by mouth 3 (three) times daily as needed. 04/28/23   Junie Spencer, FNP  carvedilol (COREG) 12.5 MG tablet Take 1 tablet (12.5 mg total) by mouth 2  (two) times daily. NEEDS FOLLOW UP APPOINTMENT FOR MORE REFILL S 05/12/23   [name], MD  cetirizine (ZYRTEC ALLERGY) 10 MG tablet Take 1 tablet (10 mg total) by mouth daily. 04/28/23   Junie Spencer, FNP  DULoxetine (CYMBALTA) 60 MG capsule Take 60 mg by mouth daily.    [provider]  enoxaparin (LOVENOX) 100 MG/ML injection Inject 1 mL (100 mg total) into the skin every 12 (twelve) hours. 05/22/23   [name], MD  EPINEPHRINE 0.3 mg/0.3 mL IJ SOAJ injection USE AS DIRECTED 05/02/22   [name] F, FNP  ezetimibe (ZETIA) 10 MG tablet TAKE 1 TABLET BY MOUTH EVERY DAY 12/16/22   [name], MD  FARXIGA 10 MG TABS tablet Take 10 mg by mouth daily. 04/22/23   [provider]  fluticasone (FLONASE) 50 MCG/ACT nasal spray Place 2 sprays into both nostrils daily. 04/04/22   [name], FNP  isosorbide mononitrate (IMDUR) 30 MG 24 hr tablet TAKE 1 TABLET BY MOUTH DAILY 06/12/22   [name], MD  mexiletine (MEXITIL) 150 MG capsule TAKE TWO CAPSULES BY MOUTH THREE TIMES DAILY 10/14/22   [name], MD  Multiple Vitamins-Minerals (MULTIVITAMINS THER. W/MINERALS) TABS Take 1 tablet by mouth daily.      [provider]  nitroGLYCERIN (NITROSTAT) 0.4 MG SL tablet PLACE 1 TABLET UNDER THE TONGUE EVERY  5 MINUTES FOR 3 DOSES AS NEEDED CHEST PAIN 02/24/23   [name], MD  oxyCODONE-acetaminophen (PERCOCET) 10-325 MG tablet Take 1 tablet by mouth every 4 (four) hours as needed for pain.    [provider]  pantoprazole (PROTONIX) 40 MG tablet TAKE 1 TABLET BY MOUTH DAILY 03/17/23   [name], MD  potassium chloride SA (KLOR-CON M) 20 MEQ tablet TAKE 2 TABLETS BY MOUTH EVERY MORNING and TAKE 1 TABLET EVERY EVENING 10/25/22   [name], MD  pravastatin (PRAVACHOL) 20 MG tablet Take 1 tablet (20 mg total) by mouth every evening. 03/12/22   [name], MD  predniSONE (STERAPRED UNI-PAK 21 TAB) 10 MG (21) TBPK tablet Use as  directed 04/28/23   Jannifer Rodney A, FNP  promethazine-dextromethorphan (PROMETHAZINE-DM) 6.25-15 MG/5ML syrup Take 5 mLs by mouth 3 (three) times daily as needed for cough. 04/28/23   Hawks, Neysa Bonito A, FNP  sacubitril-valsartan (ENTRESTO) 24-26 MG Take 1 tablet by mouth 2 (two) times daily. 02/28/23   [name], MD  sennosides-docusate sodium (SENOKOT-S) 8.6-50 MG tablet Take 1 tablet by mouth daily as needed.    [provider]  spironolactone (ALDACTONE) 25 MG tablet TAKE 1 TABLET BY MOUTH EVERY DAY 06/12/22   [name], MD  torsemide (DEMADEX) 20 MG tablet TAKE 4 TABLETS BY MOUTH DAILY (needs FOLLOWING UP APPOINTMENT FOR more refills) 05/23/23   [name], MD  traZODone (DESYREL) 100 MG tablet Take 100 mg by mouth at bedtime as needed for sleep.    [provider]  warfarin (COUMADIN) 5 MG tablet Take 1/2 TO 1 tablet daily OR as directed by coumadin clinic 02/17/23   [name], MD     Family History  Problem Relation Age of Onset   Uterine cancer Mother    Heart disease Father 8   Hyperlipidemia Father    Hypertension Father    Ankylosing spondylitis Sister    Heart disease Sister    Hypertension Sister    Stomach cancer Brother 64   Heart attack Brother     Social History   Socioeconomic History   Marital status: Single    Spouse name: Not on file   Number of children: Not on file   Years of education: Not on file   Highest education level: Master's degree (e.g., MA, MS, MEng, MEd, MSW, MBA)  Occupational History   Occupation: Disabled    Comment: ESL  Tobacco Use   Smoking status: Former    Packs/day: 0.25    Years: 43.00    Additional pack years: 0.00    Total pack years: 10.75    Types: Cigarettes, E-cigarettes    Start date: 03/03/1978    Quit date: 07/21/2021    Years since quitting: 1.8   Smokeless tobacco: Current   Tobacco comments:    4 ciggs per day  Vaping Use   Vaping Use: Every day  Substance and Sexual  Activity   Alcohol use: No    Alcohol/week: 0.0 standard drinks of alcohol   Drug use: No    Comment: Prior history of cocaine   Sexual activity: Not Currently    Birth control/protection: None  Other Topics Concern   Not on file  Social History Narrative   Not on file   Social Determinants of Health   Financial Resource Strain: Low Risk  (04/25/2023)   Overall Financial Resource Strain (CARDIA)    Difficulty of Paying Living Expenses: Not very  hard  Food Insecurity: No Food Insecurity (04/25/2023)   Hunger Vital Sign    Worried About Running Out of Food in the Last Year: Never true    Ran Out of Food in the Last Year: Never true  Transportation Needs: No Transportation Needs (04/25/2023)   PRAPARE - Administrator, Civil Service (Medical): No    Lack of Transportation (Non-Medical): No  Physical Activity: Unknown (04/25/2023)   Exercise Vital Sign    Days of Exercise per Week: 0 days    Minutes of Exercise per Session: Not on file  Stress: Stress Concern Present (04/25/2023)   [employer] of Occupational Health - Occupational Stress Questionnaire    Feeling of Stress : Rather much  Social Connections: Moderately Isolated (04/25/2023)   Social Connection and Isolation Panel [NHANES]    Frequency of Communication with Friends and Family: More than three times a week    Frequency of Social Gatherings with Friends and Family: Once a week    Attends Religious Services: More than 4 times per year    Active Member of [employer] or Organizations: No    Attends Engineer, structural: Not on file    Marital Status: Never married    Review of Systems: A 12 point ROS discussed and pertinent positives are indicated in the HPI above.  All other systems are negative.  Review of Systems  Vital Signs: There were no vitals taken for this visit.  Physical Exam  Imaging: No results found.  Labs:  CBC: Recent Labs    04/17/23 0039  WBC 6.9  HGB 14.4  HCT 43.6   PLT 119*    COAGS: Recent Labs    04/03/23 1119 05/05/23 1530 05/22/23 1402 05/27/23 1201  INR 2.4 1.8* 2.6 2.6    BMP: Recent Labs    04/17/23 0039  NA 130*  K 4.7  CL 94*  CO2 25  GLUCOSE 84  BUN 10  CALCIUM 8.4*  CREATININE 1.08*  GFRNONAA 59*    LIVER FUNCTION TESTS: No results for input(s): "BILITOT", "AST", "ALT", "ALKPHOS", "PROT", "ALBUMIN" in the last 8760 hours.  TUMOR MARKERS: No results for input(s): "AFPTM", "CEA", "CA199", "CHROMGRNA" in the last 8760 hours.  Assessment and Plan:  Brain Aneurysm; headaches and visual disturbances: [name] [name], 62 year old female, presents today to the [facility] Interventional Radiology department for an image-guided diagnostic cerebral angiogram.   Risks and benefits of this procedure were discussed with the patient including, but not limited to bleeding, infection, vascular injury or contrast induced renal failure. The risk of stroke was also discussed.   This interventional procedure involves the use of X-rays and because of the nature of the planned procedure, it is possible that we will have prolonged use of X-ray fluoroscopy.  Potential radiation risks to you include (but are not limited to) the following: - A slightly elevated risk for cancer  several years later in life. This risk is typically less than 0.5% percent. This risk is low in comparison to the normal incidence of human cancer, which is 33% for women and 50% for men according to the American Cancer Society. - Radiation induced injury can include skin redness, resembling a rash, tissue breakdown / ulcers and hair loss (which can be temporary or permanent).   The likelihood of either of these occurring depends on the difficulty of the procedure and whether you are sensitive to radiation due to previous procedures, disease, or genetic conditions.   IF your procedure  requires a prolonged use of radiation, you will be notified and given written  instructions for further action.  It is your responsibility to monitor the irradiated area for the 2 weeks following the procedure and to notify your physician if you are concerned that you have suffered a radiation induced injury.    All of the patient's questions were answered, patient is agreeable to proceed. She has been NPO. She is a full code. Her last dose of Coumadin was *** and she has been administering Lovenox injections since that time.   Consent signed and in chart.  Thank you for this interesting consult.  I greatly enjoyed meeting [name] [name] and look forward to participating in their care.  A copy of this report was sent to the requesting provider on this date.  Electronically Signed: Alwyn Ren, AGACNP-BC [phone] 05/27/2023, 2:09 PM   I spent a total of  30 Minutes   in face to face in clinical consultation, greater than 50% of which was counseling/coordinating care for diagnostic cerebral angiogram.

## 2023-05-28 ENCOUNTER — Other Ambulatory Visit: Payer: Self-pay | Admitting: Radiology

## 2023-05-28 ENCOUNTER — Other Ambulatory Visit: Payer: Self-pay | Admitting: Student

## 2023-05-28 DIAGNOSIS — Z79899 Other long term (current) drug therapy: Secondary | ICD-10-CM | POA: Diagnosis not present

## 2023-05-28 DIAGNOSIS — I679 Cerebrovascular disease, unspecified: Secondary | ICD-10-CM

## 2023-05-29 ENCOUNTER — Other Ambulatory Visit: Payer: Self-pay

## 2023-05-29 ENCOUNTER — Other Ambulatory Visit (HOSPITAL_COMMUNITY): Payer: Self-pay | Admitting: Interventional Radiology

## 2023-05-29 ENCOUNTER — Ambulatory Visit (HOSPITAL_COMMUNITY)
Admission: RE | Admit: 2023-05-29 | Discharge: 2023-05-29 | Disposition: A | Discharge status: Home / Self Care | Payer: PPO | Source: Ambulatory Visit | Attending: Interventional Radiology | Admitting: Interventional Radiology

## 2023-05-29 DIAGNOSIS — Z8673 Personal history of transient ischemic attack (TIA), and cerebral infarction without residual deficits: Secondary | ICD-10-CM | POA: Diagnosis not present

## 2023-05-29 DIAGNOSIS — I6523 Occlusion and stenosis of bilateral carotid arteries: Secondary | ICD-10-CM | POA: Diagnosis not present

## 2023-05-29 DIAGNOSIS — Z95810 Presence of automatic (implantable) cardiac defibrillator: Secondary | ICD-10-CM | POA: Insufficient documentation

## 2023-05-29 DIAGNOSIS — I5042 Chronic combined systolic (congestive) and diastolic (congestive) heart failure: Secondary | ICD-10-CM | POA: Diagnosis not present

## 2023-05-29 DIAGNOSIS — I739 Peripheral vascular disease, unspecified: Secondary | ICD-10-CM | POA: Diagnosis not present

## 2023-05-29 DIAGNOSIS — I671 Cerebral aneurysm, nonruptured: Secondary | ICD-10-CM

## 2023-05-29 DIAGNOSIS — I2510 Atherosclerotic heart disease of native coronary artery without angina pectoris: Secondary | ICD-10-CM | POA: Diagnosis not present

## 2023-05-29 DIAGNOSIS — I4819 Other persistent atrial fibrillation: Secondary | ICD-10-CM | POA: Insufficient documentation

## 2023-05-29 DIAGNOSIS — I679 Cerebrovascular disease, unspecified: Secondary | ICD-10-CM

## 2023-05-29 DIAGNOSIS — Z87891 Personal history of nicotine dependence: Secondary | ICD-10-CM | POA: Insufficient documentation

## 2023-05-29 DIAGNOSIS — Z7901 Long term (current) use of anticoagulants: Secondary | ICD-10-CM | POA: Diagnosis not present

## 2023-05-29 DIAGNOSIS — I110 Hypertensive heart disease with heart failure: Secondary | ICD-10-CM | POA: Insufficient documentation

## 2023-05-29 HISTORY — PX: IR US GUIDE VASC ACCESS RIGHT: IMG2390

## 2023-05-29 HISTORY — PX: IR ANGIO VERTEBRAL SEL SUBCLAVIAN INNOMINATE BILAT MOD SED: IMG5366

## 2023-05-29 HISTORY — PX: IR ANGIO INTRA EXTRACRAN SEL COM CAROTID INNOMINATE BILAT MOD SED: IMG5360

## 2023-05-29 LAB — PROTIME-INR
INR: 1.1 (ref 0.8–1.2)
Prothrombin Time: 14.6 seconds (ref 11.4–15.2)

## 2023-05-29 LAB — CBC WITH DIFFERENTIAL/PLATELET
Abs Immature Granulocytes: 0.02 10*3/uL (ref 0.00–0.07)
Basophils Absolute: 0.1 10*3/uL (ref 0.0–0.1)
Basophils Relative: 1 %
Eosinophils Absolute: 0.2 10*3/uL (ref 0.0–0.5)
Eosinophils Relative: 3 %
HCT: 48.1 % — ABNORMAL HIGH (ref 36.0–46.0)
Hemoglobin: 15.7 g/dL — ABNORMAL HIGH (ref 12.0–15.0)
Immature Granulocytes: 0 %
Lymphocytes Relative: 27 %
Lymphs Abs: 1.7 10*3/uL (ref 0.7–4.0)
MCH: 30.3 pg (ref 26.0–34.0)
MCHC: 32.6 g/dL (ref 30.0–36.0)
MCV: 92.9 fL (ref 80.0–100.0)
Monocytes Absolute: 0.5 10*3/uL (ref 0.1–1.0)
Monocytes Relative: 8 %
Neutro Abs: 3.8 10*3/uL (ref 1.7–7.7)
Neutrophils Relative %: 61 %
Platelets: 123 10*3/uL — ABNORMAL LOW (ref 150–400)
RBC: 5.18 MIL/uL — ABNORMAL HIGH (ref 3.87–5.11)
RDW: 14.1 % (ref 11.5–15.5)
WBC: 6.3 10*3/uL (ref 4.0–10.5)
nRBC: 0 % (ref 0.0–0.2)

## 2023-05-29 LAB — BASIC METABOLIC PANEL
Anion gap: 12 (ref 5–15)
BUN: 13 mg/dL (ref 6–20)
CO2: 29 mmol/L (ref 22–32)
Calcium: 8.9 mg/dL (ref 8.9–10.3)
Chloride: 94 mmol/L — ABNORMAL LOW (ref 98–111)
Creatinine, Ser: 1.07 mg/dL — ABNORMAL HIGH (ref 0.44–1.00)
GFR, Estimated: 59 mL/min — ABNORMAL LOW (ref >60)
Glucose, Bld: 94 mg/dL (ref 70–99)
Potassium: 3.6 mmol/L (ref 3.5–5.1)
Sodium: 135 mmol/L (ref 135–145)

## 2023-05-29 MED ORDER — VERAPAMIL HCL 2.5 MG/ML IV SOLN
INTRAVENOUS | Status: AC
  Filled 2023-05-29: qty 2

## 2023-05-29 MED ORDER — HEPARIN SODIUM (PORCINE) 1000 UNIT/ML IJ SOLN
INTRAMUSCULAR | Status: AC
  Filled 2023-05-29: qty 10

## 2023-05-29 MED ORDER — SODIUM CHLORIDE (PF) 0.9 % IJ SOLN
INTRAVENOUS | Status: AC | PRN
  Administered 2023-05-29: 200 ug via INTRA_ARTERIAL

## 2023-05-29 MED ORDER — FENTANYL CITRATE (PF) 100 MCG/2ML IJ SOLN
INTRAMUSCULAR | Status: AC | PRN
  Administered 2023-05-29 (×2): 25 ug via INTRAVENOUS

## 2023-05-29 MED ORDER — MIDAZOLAM HCL 2 MG/2ML IJ SOLN
INTRAMUSCULAR | Status: AC | PRN
  Administered 2023-05-29: .5 mg via INTRAVENOUS
  Administered 2023-05-29: 1 mg via INTRAVENOUS

## 2023-05-29 MED ORDER — SODIUM CHLORIDE 0.9 % IV SOLN: INTRAVENOUS | Status: DC

## 2023-05-29 MED ORDER — LIDOCAINE HCL 1 % IJ SOLN
INTRAMUSCULAR | Status: AC
  Filled 2023-05-29: qty 20

## 2023-05-29 MED ORDER — NITROGLYCERIN 1 MG/10 ML FOR IR/CATH LAB: INTRA_ARTERIAL | Status: AC | PRN

## 2023-05-29 MED ORDER — FENTANYL CITRATE (PF) 100 MCG/2ML IJ SOLN
INTRAMUSCULAR | Status: AC
  Filled 2023-05-29: qty 2

## 2023-05-29 MED ORDER — SODIUM CHLORIDE 0.9 % IV SOLN: INTRAVENOUS | Status: AC

## 2023-05-29 MED ORDER — LIDOCAINE HCL 1 % IJ SOLN
20.0000 mL | Freq: Once | INTRAMUSCULAR | Status: AC
  Administered 2023-05-29: 12 mL via INTRADERMAL

## 2023-05-29 MED ORDER — IOHEXOL 300 MG/ML  SOLN
150.0000 mL | Freq: Once | INTRAMUSCULAR | Status: AC | PRN
  Administered 2023-05-29: 100 mL via INTRA_ARTERIAL

## 2023-05-29 MED ORDER — MIDAZOLAM HCL 2 MG/2ML IJ SOLN
INTRAMUSCULAR | Status: AC
  Filled 2023-05-29: qty 2

## 2023-05-29 MED ORDER — NITROGLYCERIN 1 MG/10 ML FOR IR/CATH LAB
INTRA_ARTERIAL | Status: AC
  Filled 2023-05-29: qty 10

## 2023-05-29 NOTE — Sedation Documentation (Signed)
Pt transported to short stay with this RN. Right radial and right femoral dressing in tack, no bleeding and no hematoma present. Radial and DP pulses palpable. Vivan, RN present.

## 2023-05-29 NOTE — Sedation Documentation (Signed)
Dr. Corliss Skains notified of heart rate as low as 86/40.

## 2023-05-29 NOTE — Progress Notes (Signed)
Patient was given discharge instructions. She verbalized understanding. 

## 2023-05-29 NOTE — Procedures (Signed)
INR.  Status post four-vessel cerebral arteriogram.  Right CFA in right radial approach.  Findings.  1.  Stable occluded right ICA proximally with distal reconstitution in the cavernous segment from the external carotid artery branches via the ophthalmic artery with flow into the right middle cerebral artery distribution.  2.  5.3 mm x 3.8 mm right paraophthalmic region aneurysm, stable  3.  Right MCA and right ACA  supplied from collaterals from the right P3 segment of the right PCA, and from the left ICA via the anterior communicating artery.  Fatima Sanger MD.

## 2023-05-30 ENCOUNTER — Ambulatory Visit: Payer: Medicare PPO

## 2023-06-03 ENCOUNTER — Encounter: Payer: Self-pay | Admitting: Cardiology

## 2023-06-03 ENCOUNTER — Ambulatory Visit: Payer: PPO | Attending: Cardiology | Admitting: *Deleted

## 2023-06-03 DIAGNOSIS — D6859 Other primary thrombophilia: Secondary | ICD-10-CM | POA: Diagnosis not present

## 2023-06-03 DIAGNOSIS — Z5181 Encounter for therapeutic drug level monitoring: Secondary | ICD-10-CM

## 2023-06-03 DIAGNOSIS — I4819 Other persistent atrial fibrillation: Secondary | ICD-10-CM

## 2023-06-03 LAB — POCT INR: INR: 1.7 — AB (ref 2.0–3.0)

## 2023-06-03 NOTE — Patient Instructions (Signed)
S/P angiogram 05/29/23 Take warfarin 5mg  tonight then continue 1/2 tablet daily except 1 tablet on Mondays and Thursdays. Continue Lovenox injections twice daily till gone (5 left) Recheck 1 week

## 2023-06-10 ENCOUNTER — Ambulatory Visit: Payer: PPO | Attending: Cardiology | Admitting: *Deleted

## 2023-06-10 DIAGNOSIS — D6859 Other primary thrombophilia: Secondary | ICD-10-CM

## 2023-06-10 DIAGNOSIS — Z5181 Encounter for therapeutic drug level monitoring: Secondary | ICD-10-CM | POA: Diagnosis not present

## 2023-06-10 DIAGNOSIS — I4819 Other persistent atrial fibrillation: Secondary | ICD-10-CM | POA: Diagnosis not present

## 2023-06-10 LAB — POCT INR: INR: 2 (ref 2.0–3.0)

## 2023-06-10 NOTE — Patient Instructions (Signed)
S/P angiogram 05/29/23 Increase warfarin to 1/2 tablet daily except 1 tablet on Mondays, Wednesdays and Fridays. Recheck 3 weeks

## 2023-06-11 ENCOUNTER — Ambulatory Visit (INDEPENDENT_AMBULATORY_CARE_PROVIDER_SITE_OTHER): Payer: PPO

## 2023-06-11 DIAGNOSIS — I255 Ischemic cardiomyopathy: Secondary | ICD-10-CM

## 2023-06-11 LAB — CUP PACEART REMOTE DEVICE CHECK
Battery Remaining Longevity: 90 mo
Battery Remaining Percentage: 100 %
Brady Statistic RA Percent Paced: 5 %
Brady Statistic RV Percent Paced: 2 %
Date Time Interrogation Session: 20240612021100
HighPow Impedance: 52 Ohm
Implantable Lead Connection Status: 753985
Implantable Lead Connection Status: 753985
Implantable Lead Implant Date: 20081119
Implantable Lead Implant Date: 20081119
Implantable Lead Location: 753859
Implantable Lead Location: 753860
Implantable Lead Model: 185
Implantable Lead Model: 4136
Implantable Lead Serial Number: 211124
Implantable Lead Serial Number: 28383796
Implantable Pulse Generator Implant Date: 20170303
Lead Channel Impedance Value: 501 Ohm
Lead Channel Impedance Value: 611 Ohm
Lead Channel Pacing Threshold Amplitude: 0.7 V
Lead Channel Pacing Threshold Amplitude: 1.2 V
Lead Channel Pacing Threshold Pulse Width: 0.5 ms
Lead Channel Pacing Threshold Pulse Width: 0.5 ms
Lead Channel Setting Pacing Amplitude: 2 V
Lead Channel Setting Pacing Amplitude: 2.4 V
Lead Channel Setting Pacing Pulse Width: 0.5 ms
Lead Channel Setting Sensing Sensitivity: 0.6 mV
Pulse Gen Serial Number: 204398

## 2023-06-12 ENCOUNTER — Other Ambulatory Visit: Payer: Self-pay | Admitting: Cardiology

## 2023-06-12 DIAGNOSIS — I2510 Atherosclerotic heart disease of native coronary artery without angina pectoris: Secondary | ICD-10-CM

## 2023-06-12 DIAGNOSIS — I5042 Chronic combined systolic (congestive) and diastolic (congestive) heart failure: Secondary | ICD-10-CM

## 2023-06-16 ENCOUNTER — Other Ambulatory Visit (HOSPITAL_COMMUNITY): Payer: Self-pay | Admitting: Cardiology

## 2023-06-17 ENCOUNTER — Encounter: Payer: Self-pay | Admitting: Internal Medicine

## 2023-06-18 DIAGNOSIS — F411 Generalized anxiety disorder: Secondary | ICD-10-CM | POA: Diagnosis not present

## 2023-06-18 DIAGNOSIS — G8929 Other chronic pain: Secondary | ICD-10-CM | POA: Diagnosis not present

## 2023-06-18 DIAGNOSIS — M549 Dorsalgia, unspecified: Secondary | ICD-10-CM | POA: Diagnosis not present

## 2023-06-18 DIAGNOSIS — N1831 Chronic kidney disease, stage 3a: Secondary | ICD-10-CM | POA: Diagnosis not present

## 2023-06-18 DIAGNOSIS — Z6834 Body mass index (BMI) 34.0-34.9, adult: Secondary | ICD-10-CM | POA: Diagnosis not present

## 2023-06-18 DIAGNOSIS — R030 Elevated blood-pressure reading, without diagnosis of hypertension: Secondary | ICD-10-CM | POA: Diagnosis not present

## 2023-06-18 DIAGNOSIS — Z79899 Other long term (current) drug therapy: Secondary | ICD-10-CM | POA: Diagnosis not present

## 2023-06-18 DIAGNOSIS — M47816 Spondylosis without myelopathy or radiculopathy, lumbar region: Secondary | ICD-10-CM | POA: Diagnosis not present

## 2023-06-18 DIAGNOSIS — I10 Essential (primary) hypertension: Secondary | ICD-10-CM | POA: Diagnosis not present

## 2023-06-27 ENCOUNTER — Other Ambulatory Visit (HOSPITAL_COMMUNITY): Payer: Self-pay | Admitting: Cardiology

## 2023-06-27 ENCOUNTER — Encounter: Payer: Self-pay | Admitting: Cardiology

## 2023-06-30 ENCOUNTER — Encounter: Payer: Self-pay | Admitting: Cardiology

## 2023-06-30 ENCOUNTER — Ambulatory Visit (INDEPENDENT_AMBULATORY_CARE_PROVIDER_SITE_OTHER): Payer: PPO | Admitting: *Deleted

## 2023-06-30 ENCOUNTER — Ambulatory Visit: Payer: PPO | Attending: Cardiology | Admitting: Cardiology

## 2023-06-30 VITALS — BP 116/82 | HR 80 | Ht 65.0 in | Wt 211.6 lb

## 2023-06-30 DIAGNOSIS — I5020 Unspecified systolic (congestive) heart failure: Secondary | ICD-10-CM

## 2023-06-30 DIAGNOSIS — E785 Hyperlipidemia, unspecified: Secondary | ICD-10-CM

## 2023-06-30 DIAGNOSIS — I6521 Occlusion and stenosis of right carotid artery: Secondary | ICD-10-CM | POA: Diagnosis not present

## 2023-06-30 DIAGNOSIS — D6859 Other primary thrombophilia: Secondary | ICD-10-CM

## 2023-06-30 DIAGNOSIS — I4819 Other persistent atrial fibrillation: Secondary | ICD-10-CM | POA: Diagnosis not present

## 2023-06-30 DIAGNOSIS — I2510 Atherosclerotic heart disease of native coronary artery without angina pectoris: Secondary | ICD-10-CM

## 2023-06-30 DIAGNOSIS — I255 Ischemic cardiomyopathy: Secondary | ICD-10-CM | POA: Diagnosis not present

## 2023-06-30 DIAGNOSIS — Z79899 Other long term (current) drug therapy: Secondary | ICD-10-CM

## 2023-06-30 DIAGNOSIS — Z5181 Encounter for therapeutic drug level monitoring: Secondary | ICD-10-CM | POA: Diagnosis not present

## 2023-06-30 LAB — POCT INR: INR: 2.2 (ref 2.0–3.0)

## 2023-06-30 MED ORDER — PRAVASTATIN SODIUM 20 MG PO TABS: 20.0000 mg | ORAL_TABLET | Freq: Every evening | ORAL | 11 refills | Status: DC

## 2023-06-30 MED ORDER — EMPAGLIFLOZIN 10 MG PO TABS: 10.0000 mg | ORAL_TABLET | Freq: Every day | ORAL | 3 refills | Status: DC

## 2023-06-30 NOTE — Patient Instructions (Addendum)
Medication Instructions:  Your physician recommends that you continue on your current medications as directed. Please refer to the Current Medication list given to you today.  Labwork: Your physician recommends that you return for a FASTING lipid, CMET & CBC in 6 months just before your next visit. Please do not eat or drink for at least 8 hours when you have this done. You may take your medications that morning with a sip of water. [employer] ([email or web address]) or [employer] or your family doctor's office  Testing/Procedures: Your physician has requested that you have an echocardiogram. Echocardiography is a painless test that uses sound waves to create images of your heart. It provides your doctor with information about the size and shape of your heart and how well your heart's chambers and valves are working. This procedure takes approximately one hour. There are no restrictions for this procedure. Please do NOT wear cologne, perfume, aftershave, or lotions (deodorant is allowed). Please arrive 15 minutes prior to your appointment time. Your physician has requested that you have a carotid duplex. This test is an ultrasound of the carotid arteries in your neck. It looks at blood flow through these arteries that supply the brain with blood. Allow one hour for this exam. There are no restrictions or special instructions.  Follow-Up: Your physician recommends that you schedule a follow-up appointment in: 6 months  Any Other Special Instructions Will Be Listed Below (If Applicable).  If you need a refill on your cardiac medications before your next appointment, please call your pharmacy.

## 2023-06-30 NOTE — Progress Notes (Signed)
Cardiology Office Note  Date: 06/30/2023   ID: [name] [name], DOB [birth year], MRN [id number]  History of Present Illness: [name] [name] is a 62 y.o. female that I last saw in the office back in May 2022.  She has had interval follow-up in the heart failure clinic as well as with Dr. Ladona Ridgel.  She presents overdue for follow-up.  Fortunately, states that she has been doing fairly well from a cardiac perspective.  No palpitations or syncope, no device discharges.  No active angina with current level of activity.  Generally NYHA class II dyspnea.  She has had some fluid retention during the higher temperature months but diuretic dosing has been stable.  I did review her recent lab work from May.  [employer] ICD in place as well as history of VT ablation.  She continues to follow with Dr. Ladona Ridgel.  Last device interrogation indicated normal function, one episode of VT and NSVT each.  She continues on amiodarone and mexiletine.  She remains on Coumadin with follow-up in the anticoagulation clinic.  Denies any spontaneous bleeding problems.  Her last echocardiogram was in January 2023, LVEF 30 to 35% at that time.  I went over her medications.  Refills provided for Jardiance (this was much cheaper for her than [place]) and Pravachol.  Anticipate follow-up lab work for her next visit.  Physical Exam: VS:  BP 116/82   Pulse 80   Ht 5\' 5"  (1.651 m)   Wt 211 lb 9.6 oz (96 kg)   SpO2 97%   BMI 35.21 kg/m , BMI Body mass index is 35.21 kg/m.  Wt Readings from Last 3 Encounters:  06/30/23 211 lb 9.6 oz (96 kg)  05/29/23 200 lb (90.7 kg)  04/28/23 207 lb (93.9 kg)    General: Patient appears comfortable at rest. HEENT: Conjunctiva and lids normal. Neck: Supple, no elevated JVP or carotid bruits. Lungs: Clear to auscultation, nonlabored breathing at rest. Cardiac: Regular rate and rhythm, no S3, 1/6 systolic murmur. Abdomen: Soft, bowel sounds present. Extremities: Mild ankle  edema.  ECG:  An ECG dated 04/17/2023 was personally reviewed today and demonstrated:  Sinus rhythm with prolonged PR interval and old anteroseptal infarct pattern.  Labwork: 04/17/2023: B Natriuretic Peptide 184.9 05/29/2023: BUN 13; Creatinine, Ser 1.07; Hemoglobin 15.7; Platelets 123; Potassium 3.6; Sodium 135     Component Value Date/Time   CHOL 164 01/28/2022 1344   CHOL 173 10/09/2021 1602   TRIG 114 01/28/2022 1344   HDL 56 01/28/2022 1344   HDL 44 10/09/2021 1602   CHOLHDL 2.9 01/28/2022 1344   VLDL 23 01/28/2022 1344   LDLCALC 85 01/28/2022 1344   LDLCALC 104 (H) 10/09/2021 1602   Other Studies Reviewed Today:  Echocardiogram 01/28/2022:  1. Left ventricular ejection fraction, by estimation, is 30 to 35%. The  left ventricle has moderately decreased function. The left ventricle  demonstrates regional wall motion abnormalities with mid to apical  anterior and anteroseptal akinesis, apical  akinesis. The left ventricular internal cavity size was severely dilated.  There is mild left ventricular hypertrophy. Left ventricular diastolic  parameters are consistent with Grade I diastolic dysfunction (impaired  relaxation).   2. Right ventricular systolic function is normal. The right ventricular  size is normal. Tricuspid regurgitation signal is inadequate for assessing  PA pressure.   3. Left atrial size was mild to moderately dilated.   4. The aortic valve is tricuspid. Aortic valve regurgitation is not  visualized. No aortic stenosis is  present.   5. The mitral valve is normal in structure. Moderate mitral valve  regurgitation. No evidence of mitral stenosis.   6. The inferior vena cava is normal in size with greater than 50%  respiratory variability, suggesting right atrial pressure of 3 mmHg.   Carotid Dopplers 06/03/2022: Summary:  Right Carotid: Evidence consistent with a total occlusion of the right  ICA.   Left Carotid: Velocities in the left ICA are consistent with  a 1-39%  stenosis.   Vertebrals: Bilateral vertebral arteries demonstrate antegrade flow.  Subclavians: Bilateral subclavian artery flow was disturbed.   Assessment and Plan:  1.  HFrEF with ischemic cardiomyopathy, LVEF 30 to 35% by echocardiogram in January 2023.  Reports NYHA class II dyspnea, some fluctuation in weight but relatively stable diuretic regimen with as needed use of extra Demadex tablet over baseline.  Refill Jardiance 10 mg daily.  Otherwise continue Coreg, Entresto, Aldactone, and potassium supplement.  We will update her echocardiogram to reassess LV and RV function.  2.  CAD with CTO first diagonal and status post DES to the circumflex in January 2022.  No active angina at this time.  She is not on aspirin given use of Coumadin.  Continue Imdur, Pravachol, and Zetia.  Update FLP for next visit.  3.  Persistent atrial fibrillation status post TEE guided cardioversion in December 2021.  Clinically stable with no major breakthrough events.  She is on amiodarone and Coumadin.  Keep follow-up in the anticoagulation clinic.  4.  Carotid artery disease with chronic occlusion of the RICA and mild LICA stenosis as of carotid Dopplers in June 2023.  Repeat carotid Dopplers this year.  5.  Antiphospholipid antibody syndrome with history of prior stroke, remains on chronic Coumadin.  Check CBC for next visit.  6.  [employer] ICD in place with history of recurrent VT status post VT ablation and continuing on amiodarone and mexiletine with followed by Dr. Ladona Ridgel.  Check CMET for next visit.  7.  Mitral regurgitation, moderate by echocardiogram in January 2023.  Echocardiogram will be updated as discussed above.  Disposition:  Follow up  6 months.  Signed, [name], M.D., F.A.C.C. [facility]

## 2023-06-30 NOTE — Patient Instructions (Signed)
S/P angiogram 05/29/23 Continue warfarin 1/2 tablet daily except 1 tablet on Mondays, Wednesdays and Fridays. Recheck 4 weeks

## 2023-07-07 ENCOUNTER — Other Ambulatory Visit: Payer: Self-pay

## 2023-07-07 ENCOUNTER — Encounter: Payer: Self-pay | Admitting: Cardiology

## 2023-07-07 MED ORDER — VERQUVO 2.5 MG PO TABS: ORAL_TABLET | ORAL | 3 refills | Status: DC

## 2023-07-08 ENCOUNTER — Ambulatory Visit: Payer: PPO | Attending: Cardiology

## 2023-07-08 ENCOUNTER — Ambulatory Visit (INDEPENDENT_AMBULATORY_CARE_PROVIDER_SITE_OTHER): Payer: PPO

## 2023-07-08 DIAGNOSIS — I255 Ischemic cardiomyopathy: Secondary | ICD-10-CM | POA: Diagnosis not present

## 2023-07-08 DIAGNOSIS — I6521 Occlusion and stenosis of right carotid artery: Secondary | ICD-10-CM | POA: Diagnosis not present

## 2023-07-08 DIAGNOSIS — I5020 Unspecified systolic (congestive) heart failure: Secondary | ICD-10-CM

## 2023-07-08 DIAGNOSIS — Z79899 Other long term (current) drug therapy: Secondary | ICD-10-CM

## 2023-07-08 DIAGNOSIS — E785 Hyperlipidemia, unspecified: Secondary | ICD-10-CM | POA: Diagnosis not present

## 2023-07-08 NOTE — Progress Notes (Signed)
Remote ICD transmission.   

## 2023-07-09 ENCOUNTER — Encounter: Payer: Self-pay | Admitting: Cardiology

## 2023-07-09 LAB — ECHOCARDIOGRAM COMPLETE
AR max vel: 2.26 cm2
AV Area VTI: 2.11 cm2
AV Area mean vel: 2.43 cm2
AV Mean grad: 4 mmHg
AV Peak grad: 7.2 mmHg
Ao pk vel: 1.34 m/s
Area-P 1/2: 2.56 cm2
Calc EF: 33.8 %
S' Lateral: 5.5 cm
Single Plane A2C EF: 27.4 %
Single Plane A4C EF: 39.6 %

## 2023-07-13 ENCOUNTER — Other Ambulatory Visit: Payer: Self-pay | Admitting: Cardiology

## 2023-07-13 DIAGNOSIS — I5042 Chronic combined systolic (congestive) and diastolic (congestive) heart failure: Secondary | ICD-10-CM

## 2023-07-14 ENCOUNTER — Telehealth: Payer: Self-pay

## 2023-07-14 ENCOUNTER — Other Ambulatory Visit (HOSPITAL_COMMUNITY): Payer: Self-pay

## 2023-07-14 NOTE — Telephone Encounter (Signed)
Pharmacy Patient Advocate Encounter   Received notification from [facility] that prior authorization for [facility] 2.5MG  is required/requested.   P/A has been initiated via Email/fax     Request [facility]

## 2023-07-15 ENCOUNTER — Other Ambulatory Visit: Payer: Self-pay | Admitting: Cardiology

## 2023-07-15 MED ORDER — VERQUVO 2.5 MG PO TABS: ORAL_TABLET | ORAL | 5 refills | Status: DC

## 2023-07-16 ENCOUNTER — Encounter: Payer: Self-pay | Admitting: Cardiology

## 2023-07-16 ENCOUNTER — Encounter: Payer: Self-pay | Admitting: Pharmacist

## 2023-07-16 ENCOUNTER — Other Ambulatory Visit: Payer: Self-pay | Admitting: Cardiology

## 2023-07-16 ENCOUNTER — Telehealth: Payer: Self-pay | Admitting: Pharmacist

## 2023-07-16 MED ORDER — TORSEMIDE 20 MG PO TABS: 20.0000 mg | ORAL_TABLET | Freq: Four times a day (QID) | ORAL | 4 refills | Status: DC

## 2023-07-16 MED ORDER — ENTRESTO 24-26 MG PO TABS: 1.0000 | ORAL_TABLET | Freq: Two times a day (BID) | ORAL | 3 refills | Status: DC

## 2023-07-16 NOTE — Addendum Note (Signed)
Addended by: Xayvier Vallez E on: 07/16/2023 10:05 AM   Modules accepted: Orders

## 2023-07-16 NOTE — Telephone Encounter (Signed)
Received message from Dr McDowell's RN regarding cost issues with [facility] Med is Tier 3 on her plan which would typically cost $47/month but she is already in the donut hole. Reports copay was quoted at $170 at her pharmacy.  Cardiomyopathy grant is currently open through the [facility] which would pay remaining copays for her Aileen Pilot as well as [place] Looks like she was previously enrolled in this grant but it expired last week. I have re-enrolled pt and sent her updated grant info to bring to her pharmacy for her branded HF meds, grant info below for our records and is approved through 07/09/24, pt very appreciative for the assistance.  CARD NO. [id number]   BIN [id number]   PCN [id number]   GROUP [id number]

## 2023-07-17 ENCOUNTER — Telehealth: Payer: Self-pay | Admitting: Cardiology

## 2023-07-17 ENCOUNTER — Other Ambulatory Visit (HOSPITAL_COMMUNITY): Payer: Self-pay

## 2023-07-17 NOTE — Telephone Encounter (Signed)
Pts plan will only cover 30 tablets per 30 days.However I have done a verbal appeal for the original requested qty  Turn around time 7 days    Contact: [phone]  Ref# [id number]

## 2023-07-17 NOTE — Telephone Encounter (Signed)
Les Pou from [place] [facility] requesting to speak with Dr. Diona Browner or nurse regarding Aileen Pilot. Please call [phone]

## 2023-07-18 ENCOUNTER — Other Ambulatory Visit (HOSPITAL_COMMUNITY): Payer: Self-pay

## 2023-07-18 DIAGNOSIS — M47816 Spondylosis without myelopathy or radiculopathy, lumbar region: Secondary | ICD-10-CM | POA: Diagnosis not present

## 2023-07-18 DIAGNOSIS — G8929 Other chronic pain: Secondary | ICD-10-CM | POA: Diagnosis not present

## 2023-07-18 DIAGNOSIS — R030 Elevated blood-pressure reading, without diagnosis of hypertension: Secondary | ICD-10-CM | POA: Diagnosis not present

## 2023-07-18 DIAGNOSIS — I10 Essential (primary) hypertension: Secondary | ICD-10-CM | POA: Diagnosis not present

## 2023-07-18 DIAGNOSIS — N1831 Chronic kidney disease, stage 3a: Secondary | ICD-10-CM | POA: Diagnosis not present

## 2023-07-18 DIAGNOSIS — Z79899 Other long term (current) drug therapy: Secondary | ICD-10-CM | POA: Diagnosis not present

## 2023-07-18 DIAGNOSIS — Z6835 Body mass index (BMI) 35.0-35.9, adult: Secondary | ICD-10-CM | POA: Diagnosis not present

## 2023-07-18 DIAGNOSIS — M549 Dorsalgia, unspecified: Secondary | ICD-10-CM | POA: Diagnosis not present

## 2023-07-18 DIAGNOSIS — F411 Generalized anxiety disorder: Secondary | ICD-10-CM | POA: Diagnosis not present

## 2023-07-18 DIAGNOSIS — E6609 Other obesity due to excess calories: Secondary | ICD-10-CM | POA: Diagnosis not present

## 2023-07-18 MED ORDER — VERQUVO 5 MG PO TABS: 5.0000 mg | ORAL_TABLET | Freq: Every day | ORAL | 5 refills | Status: DC

## 2023-07-18 NOTE — Telephone Encounter (Signed)
Pharmacy Patient Advocate Encounter  Received notification from [employer]/ [facility] that Prior Authorization for VERQUVO 2.5MG   has been APPROVED from 7.16.24 to 7.16.25.Marland Kitchen  SEE APPROVAL LETTER IN MEDIA

## 2023-07-18 NOTE — Telephone Encounter (Signed)
Insurance only covers 1 tab a day and rx was sent in for 2.5mg  1 tab daily for 2 weeks then to increase to 5mg  daily, that rx would need to be for 1 of the 5mg  tabs instead of 2 of the 2.5mg  tabs. Updated rx sent in. Pt states she did already pick up rx yesterday for 30 tablets of the 2.5mg . she is aware to use this up first, but rx will run out in 3 weeks, then pick up refill I sent in for 1 of the 5mg  tabs daily.

## 2023-07-21 ENCOUNTER — Encounter: Payer: Self-pay | Admitting: Cardiology

## 2023-07-22 ENCOUNTER — Other Ambulatory Visit: Payer: Self-pay | Admitting: *Deleted

## 2023-07-22 DIAGNOSIS — Z79899 Other long term (current) drug therapy: Secondary | ICD-10-CM | POA: Diagnosis not present

## 2023-07-22 MED ORDER — TORSEMIDE 20 MG PO TABS: 20.0000 mg | ORAL_TABLET | Freq: Four times a day (QID) | ORAL | 6 refills | Status: DC

## 2023-07-22 MED ORDER — TORSEMIDE 20 MG PO TABS: 20.0000 mg | ORAL_TABLET | Freq: Four times a day (QID) | ORAL | 0 refills | Status: DC

## 2023-07-24 ENCOUNTER — Encounter: Payer: Self-pay | Admitting: Cardiology

## 2023-07-24 ENCOUNTER — Encounter: Payer: Self-pay | Admitting: Internal Medicine

## 2023-07-25 ENCOUNTER — Encounter: Payer: Self-pay | Admitting: Nurse Practitioner

## 2023-07-25 ENCOUNTER — Telehealth: Payer: Self-pay | Admitting: *Deleted

## 2023-07-25 ENCOUNTER — Ambulatory Visit: Payer: PPO | Attending: Nurse Practitioner | Admitting: Nurse Practitioner

## 2023-07-25 VITALS — BP 122/62 | HR 69 | Ht 65.0 in | Wt 214.2 lb

## 2023-07-25 DIAGNOSIS — E669 Obesity, unspecified: Secondary | ICD-10-CM

## 2023-07-25 DIAGNOSIS — I779 Disorder of arteries and arterioles, unspecified: Secondary | ICD-10-CM

## 2023-07-25 DIAGNOSIS — M546 Pain in thoracic spine: Secondary | ICD-10-CM | POA: Diagnosis not present

## 2023-07-25 DIAGNOSIS — Z8673 Personal history of transient ischemic attack (TIA), and cerebral infarction without residual deficits: Secondary | ICD-10-CM

## 2023-07-25 DIAGNOSIS — D6859 Other primary thrombophilia: Secondary | ICD-10-CM

## 2023-07-25 DIAGNOSIS — I472 Ventricular tachycardia, unspecified: Secondary | ICD-10-CM | POA: Diagnosis not present

## 2023-07-25 DIAGNOSIS — I25119 Atherosclerotic heart disease of native coronary artery with unspecified angina pectoris: Secondary | ICD-10-CM

## 2023-07-25 DIAGNOSIS — I340 Nonrheumatic mitral (valve) insufficiency: Secondary | ICD-10-CM

## 2023-07-25 DIAGNOSIS — I4819 Other persistent atrial fibrillation: Secondary | ICD-10-CM

## 2023-07-25 DIAGNOSIS — I2510 Atherosclerotic heart disease of native coronary artery without angina pectoris: Secondary | ICD-10-CM

## 2023-07-25 DIAGNOSIS — Z79899 Other long term (current) drug therapy: Secondary | ICD-10-CM

## 2023-07-25 DIAGNOSIS — Z95810 Presence of automatic (implantable) cardiac defibrillator: Secondary | ICD-10-CM | POA: Diagnosis not present

## 2023-07-25 DIAGNOSIS — I5020 Unspecified systolic (congestive) heart failure: Secondary | ICD-10-CM | POA: Diagnosis not present

## 2023-07-25 DIAGNOSIS — Z5181 Encounter for therapeutic drug level monitoring: Secondary | ICD-10-CM | POA: Diagnosis not present

## 2023-07-25 DIAGNOSIS — I255 Ischemic cardiomyopathy: Secondary | ICD-10-CM

## 2023-07-25 MED ORDER — ISOSORBIDE MONONITRATE ER 30 MG PO TB24
30.0000 mg | ORAL_TABLET | Freq: Two times a day (BID) | ORAL | 2 refills | Status: DC
Start: 2023-07-25 — End: 2023-09-11

## 2023-07-25 NOTE — Telephone Encounter (Signed)
Returned call to patient Declined add on for week of 7/29-8/2  Reports meds were adjusted and she was advised to monitor weight and return call on 7/30 with update. Agreeable to this plan.

## 2023-07-25 NOTE — Patient Instructions (Addendum)
Medication Instructions:  Your physician has recommended you make the following change in your medication:  *Increase Imdur to 30 Mg twice daily    Labwork: LABS  Testing/Procedures: None  Follow-Up: Your physician recommends that you schedule a follow-up appointment in: 4-6 weeks with Philis Nettle    Any Other Special Instructions Will Be Listed Below (If Applicable).  If you need a refill on your cardiac medications before your next appointment, please call your pharmacy.

## 2023-07-25 NOTE — Telephone Encounter (Signed)
Spoke with pt who states that her weight has been fluctuating through out the day. Pt will monitor weight each morning for the next few days and report findings. Pt c/o chest pains that are waking her in the night and she has to take a nitroglycerin. Pt reports after taking one nitroglycerin the pain is relieved. She denies SOB and increased pain with exertion. Please advise.

## 2023-07-25 NOTE — Progress Notes (Addendum)
Cardiology Office Note:  .   Date:  07/25/2023 ID:  [name] [name], DOB 1961-03-11, MRN [id number] PCP: Junie Spencer, FNP  [facility] Providers Cardiologist:  Nona Dell, MD Electrophysiologist:  Lewayne Bunting, MD    History of Present Illness: .   [name] [name] is a 62 y.o. female with a PMH of HFrEF, ischemic cardiopathy, CAD, persistent A-fib, s/p TEE/cardioversion, carotid artery disease, antiphospholipid antibody syndrome with history of prior stroke, history of recurrent VTE, s/p VT ablation and [employer] ICD in place, history of mitral valve regurgitation,  who presents today for  swelling and weight gain evaluation.  She is closely followed at the heart failure clinic as well as followed by Dr. Ladona Ridgel with EP.  Last seen by Dr. Diona Browner on June 30, 2023.  At that time, she was doing well from a cardiac perspective.  She recently contacted our office noting weight gain and intermittent shortness of breath.  Dr. Diona Browner recommended for her to be seen for an office evaluation.  She states she has noticed her weight fluctuating recently, has been taking extra Torsemide d/t this. Also states she has noticed more shortness of breath that she attributes to the summer humidity.  States the last several nights she has waking up and taken nitroglycerin as she has been having chest pain that has been waking her up from her sleep. When describing her chest pain she says it is not located along her chest but along her upper back, and doesn't describe it as a tearing sensation or painful, described as a fullness and a pressure along her back, she wonders if this is related to gas/indigestion as burping/belching helps.  She says she has also experienced radiation to her neck, however past MI she experienced back pain. NTG has helped her symptoms. Denies any palpitations, syncope, presyncope, dizziness, orthopnea, PND, swelling acute bleeding, or claudication.  Studies Reviewed: .     Echo 06/2023:  1. Left ventricular ejection fraction, by estimation, is 30 to 35%. The  left ventricle has moderately decreased function. The left ventricle  demonstrates regional wall motion abnormalities (see scoring  diagram/findings for description). The left  ventricular internal cavity size was moderately dilated. Left ventricular  diastolic parameters are consistent with Grade I diastolic dysfunction  (impaired relaxation).   2. Right ventricular systolic function is normal. The right ventricular  size is normal. There is normal pulmonary artery systolic pressure.   3. Left atrial size was mild to moderately dilated.   4. The mitral valve is normal in structure. Mild mitral valve  regurgitation. No evidence of mitral stenosis.   5. The aortic valve has an indeterminant number of cusps. Aortic valve  regurgitation is not visualized. No aortic stenosis is present.   6. The inferior vena cava is normal in size with greater than 50%  respiratory variability, suggesting right atrial pressure of 3 mmHg.   Comparison(s): No significant change from prior study. Prior images  reviewed side by side.  Carotid doppler 06/2023: Summary:  Right Carotid: Evidence consistent with a total occlusion of the right  ICA.   Left Carotid: Velocities in the left ICA are consistent with a 1-39%  stenosis.   Vertebrals: Bilateral vertebral arteries demonstrate antegrade flow.  Subclavians: Normal flow hemodynamics were seen in bilateral subclavian arteries.   Right/Left heart cath 02/2021: 1. Low filling pressures.  2. Preserved cardiac output.  3. Stable coronaries, patent LCx stent.  Known chronic occlusion of D1.  Physical Exam:  VS:  BP 122/62   Pulse 69   Ht 5\' 5"  (1.651 m)   Wt 214 lb 3.2 oz (97.2 kg)   SpO2 97%   BMI 35.64 kg/m    Wt Readings from Last 3 Encounters:  07/25/23 214 lb 3.2 oz (97.2 kg)  06/30/23 211 lb 9.6 oz (96 kg)  05/29/23 200 lb (90.7 kg)    GEN: Obese, 62  y.o. female in no acute distress NECK: No JVD; No carotid bruits CARDIAC: S1/S2, RRR, no murmurs, rubs, gallops RESPIRATORY:  Clear to auscultation without rales, wheezing or rhonchi  ABDOMEN: Soft, non-tender, non-distended EXTREMITIES:  No edema; No deformity   ASSESSMENT AND PLAN: .    CAD, back pain R/LHC in 2022 revealed stable coronary arteries, patent left circumflex stent with known chronic occlusion of D1.  Recent sensation of back pressure/fullness for the last several nights, nitroglycerin has helped.  Discussed options regarding medical management, ischemic evaluation, or cardiac catheterization.  We came to the shared medical decision to increase Imdur to 30 mg BID to help improve her symptoms.  Continue carvedilol, Zetia, pravastatin, and NTG PRN. Heart healthy diet and regular cardiovascular exercise encouraged. ED precautions discussed.  2. HFrEF, ICM, medication management Stage C, NYHA class II-III symptoms. TTE 06/2023 revealed EF 30-35%. Recent weight gain and has been taking extra torsemide. She has pending labs previously arranged by Dr. Diona Browner including CMET and also will include BNP. Continue Torsemide and potassium supplement, Jardiance, Aldactone, Entresto, Verquvo, and Coreg. Increasing Imdur as mentioned above. Low sodium diet, fluid restriction <2L, and daily weights encouraged. Educated to contact our office for weight gain of 2 lbs overnight or 5 lbs in one week.  3. Persistent A-fib Hx of TEE/DCCV 11/2020.  Denies any tachycardia or palpitations.  Continue amiodarone and carvedilol.  Heart rate is well-controlled.  She is tolerating warfarin well.  Continue to follow-up at the Coumadin clinic as scheduled. Will be obtaining CBC and CMET as previously arranged by Dr. Diona Browner.  4. 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 This appears to be  stable from previous study. Continue pravastatin and Zetia. Not on Aspirin d/t being on Warfarin. Hx of antiphospholipid antibody syndrome. Denies any symptoms. Continue to follow with PCP.  5. Hx of recurrent VT, s/p VT ablation and ICD in place Denies any symptoms or events.  Continue mexiletine and amiodarone.  Continue to follow-up with Dr. Ladona Ridgel.  Will obtain CMET as mentioned above.  6. Mitral valve regurgitation Mild mitral valve regurgitation seen TTE 06/2023. Plan to update TTE in 3-5 years or sooner if clinically indicated.    7. Obesity Weight loss via diet and exercise encouraged. Discussed the impact being overweight would have on cardiovascular risk.  Dispo: Follow-up with me or APP in 4 to 6 weeks or sooner if anything changes.  Signed, Sharlene Dory, NP

## 2023-07-25 NOTE — Telephone Encounter (Signed)
Noted  

## 2023-07-28 ENCOUNTER — Ambulatory Visit: Payer: PPO | Attending: Cardiology | Admitting: *Deleted

## 2023-07-28 DIAGNOSIS — D6859 Other primary thrombophilia: Secondary | ICD-10-CM

## 2023-07-28 DIAGNOSIS — I4819 Other persistent atrial fibrillation: Secondary | ICD-10-CM | POA: Diagnosis not present

## 2023-07-28 DIAGNOSIS — Z5181 Encounter for therapeutic drug level monitoring: Secondary | ICD-10-CM

## 2023-07-28 LAB — POCT INR: INR: 2.2 (ref 2.0–3.0)

## 2023-07-28 NOTE — Patient Instructions (Signed)
S/P angiogram 05/29/23 Continue warfarin 1/2 tablet daily except 1 tablet on Mondays, Wednesdays and Fridays. Recheck 6 weeks

## 2023-07-30 NOTE — Telephone Encounter (Signed)
Spoke with pt who states that she does not have a ride to bring in meds to review. She will send history of BP's and weights via MyChart.

## 2023-08-02 ENCOUNTER — Other Ambulatory Visit (HOSPITAL_COMMUNITY): Payer: Self-pay | Admitting: Cardiology

## 2023-08-02 DIAGNOSIS — I5042 Chronic combined systolic (congestive) and diastolic (congestive) heart failure: Secondary | ICD-10-CM

## 2023-08-03 ENCOUNTER — Telehealth: Payer: Self-pay | Admitting: Nurse Practitioner

## 2023-08-03 NOTE — Telephone Encounter (Signed)
I received patient's weight log.  Log shows stable weights over the past few days.  Stable weight findings from last office visit.  I recommend no medication changes until her lab results return.   Follow-up as scheduled.   Sharlene Dory, NP

## 2023-08-04 ENCOUNTER — Other Ambulatory Visit (HOSPITAL_COMMUNITY): Payer: Self-pay

## 2023-08-04 DIAGNOSIS — I5042 Chronic combined systolic (congestive) and diastolic (congestive) heart failure: Secondary | ICD-10-CM

## 2023-08-04 MED ORDER — CARVEDILOL 12.5 MG PO TABS
12.5000 mg | ORAL_TABLET | Freq: Two times a day (BID) | ORAL | 4 refills | Status: DC
Start: 2023-08-04 — End: 2023-10-10

## 2023-08-04 MED ORDER — CARVEDILOL 12.5 MG PO TABS: 12.5000 mg | ORAL_TABLET | Freq: Two times a day (BID) | ORAL | 3 refills | Status: DC

## 2023-08-14 DIAGNOSIS — I5021 Acute systolic (congestive) heart failure: Secondary | ICD-10-CM | POA: Diagnosis not present

## 2023-08-14 DIAGNOSIS — I255 Ischemic cardiomyopathy: Secondary | ICD-10-CM | POA: Diagnosis not present

## 2023-08-14 DIAGNOSIS — I6521 Occlusion and stenosis of right carotid artery: Secondary | ICD-10-CM | POA: Diagnosis not present

## 2023-08-14 DIAGNOSIS — E785 Hyperlipidemia, unspecified: Secondary | ICD-10-CM | POA: Diagnosis not present

## 2023-08-14 DIAGNOSIS — J441 Chronic obstructive pulmonary disease with (acute) exacerbation: Secondary | ICD-10-CM | POA: Diagnosis not present

## 2023-08-14 DIAGNOSIS — Z79899 Other long term (current) drug therapy: Secondary | ICD-10-CM | POA: Diagnosis not present

## 2023-08-18 ENCOUNTER — Ambulatory Visit: Payer: PPO | Admitting: Nurse Practitioner

## 2023-08-19 ENCOUNTER — Ambulatory Visit: Payer: PPO | Admitting: Family Medicine

## 2023-08-22 DIAGNOSIS — G8929 Other chronic pain: Secondary | ICD-10-CM | POA: Diagnosis not present

## 2023-08-22 DIAGNOSIS — Z6835 Body mass index (BMI) 35.0-35.9, adult: Secondary | ICD-10-CM | POA: Diagnosis not present

## 2023-08-22 DIAGNOSIS — M47816 Spondylosis without myelopathy or radiculopathy, lumbar region: Secondary | ICD-10-CM | POA: Diagnosis not present

## 2023-08-22 DIAGNOSIS — M1711 Unilateral primary osteoarthritis, right knee: Secondary | ICD-10-CM | POA: Diagnosis not present

## 2023-08-22 DIAGNOSIS — M549 Dorsalgia, unspecified: Secondary | ICD-10-CM | POA: Diagnosis not present

## 2023-08-22 DIAGNOSIS — F411 Generalized anxiety disorder: Secondary | ICD-10-CM | POA: Diagnosis not present

## 2023-08-22 DIAGNOSIS — E6609 Other obesity due to excess calories: Secondary | ICD-10-CM | POA: Diagnosis not present

## 2023-08-22 DIAGNOSIS — Z79899 Other long term (current) drug therapy: Secondary | ICD-10-CM | POA: Diagnosis not present

## 2023-08-26 ENCOUNTER — Ambulatory Visit (INDEPENDENT_AMBULATORY_CARE_PROVIDER_SITE_OTHER): Payer: PPO | Admitting: Family Medicine

## 2023-08-26 ENCOUNTER — Encounter: Payer: Self-pay | Admitting: Family Medicine

## 2023-08-26 VITALS — BP 115/74 | HR 66 | Temp 98.0°F | Ht 65.0 in | Wt 214.0 lb

## 2023-08-26 DIAGNOSIS — F17200 Nicotine dependence, unspecified, uncomplicated: Secondary | ICD-10-CM | POA: Diagnosis not present

## 2023-08-26 DIAGNOSIS — Z23 Encounter for immunization: Secondary | ICD-10-CM | POA: Diagnosis not present

## 2023-08-26 DIAGNOSIS — I739 Peripheral vascular disease, unspecified: Secondary | ICD-10-CM

## 2023-08-26 DIAGNOSIS — I6521 Occlusion and stenosis of right carotid artery: Secondary | ICD-10-CM

## 2023-08-26 DIAGNOSIS — W19XXXA Unspecified fall, initial encounter: Secondary | ICD-10-CM

## 2023-08-26 DIAGNOSIS — G894 Chronic pain syndrome: Secondary | ICD-10-CM

## 2023-08-26 DIAGNOSIS — Z6835 Body mass index (BMI) 35.0-35.9, adult: Secondary | ICD-10-CM | POA: Diagnosis not present

## 2023-08-26 DIAGNOSIS — Z1331 Encounter for screening for depression: Secondary | ICD-10-CM

## 2023-08-26 DIAGNOSIS — Z95810 Presence of automatic (implantable) cardiac defibrillator: Secondary | ICD-10-CM

## 2023-08-26 DIAGNOSIS — D6861 Antiphospholipid syndrome: Secondary | ICD-10-CM

## 2023-08-26 DIAGNOSIS — Z1231 Encounter for screening mammogram for malignant neoplasm of breast: Secondary | ICD-10-CM

## 2023-08-26 NOTE — Progress Notes (Addendum)
New Patient Office Visit  Subjective   Patient ID: [name] [name], female    DOB: 01-Mar-1961  Age: 62 y.o. MRN: [id number]  CC:  Chief Complaint  Patient presents with   Establish Care    Recent fall, shoulder injury   HPI [name] [name] presents to establish care She was previously cared for at [facility] by Shon Hale  Reports that she has many specialists General Cardiology - [facility]  Coumadin Clinic - Tami Lin, Coumadin Clinic - following up next week  Electrophysiology - Ladona Ridgel  Pain Management at [facility], [facility] - Radiology for aneurysm and carotid artery occlusion - sees him once per year.  Tobacco Use  States that she continues to smoke 3 cigarettes per day. Reports that she tried quitting this summer and gained 18 lbs.   Fall  Fell 1.5 weeks ago. Reports that she fell in the shower. Landed on her tailbone on the shower floor. Reports that her shoulders and tailbone are sore from the fall. Main pain initially was her shoulder, but is improving with heating pad and OTC creams.   Obesity  Reports that she is trying to do chair exercises. She reports that she has pain and swelling in her right knee after exercises. She is limited in her physical activity as she does not like to get outside in the summer during the heat.   Outpatient Encounter Medications as of 08/26/2023  Medication Sig   amiodarone (PACERONE) 200 MG tablet Take 1.5 tablets by mouth Monday through Thursday.  Take 2 tablets by mouth Friday through Sunday.   carvedilol (COREG) 12.5 MG tablet Take 1 tablet (12.5 mg total) by mouth 2 (two) times daily with a meal.   cetirizine (ZYRTEC ALLERGY) 10 MG tablet Take 1 tablet (10 mg total) by mouth daily.   DULoxetine (CYMBALTA) 60 MG capsule Take 60 mg by mouth daily.   empagliflozin (JARDIANCE) 10 MG TABS tablet Take 1 tablet (10 mg total) by mouth daily before breakfast.   EPINEPHRINE 0.3 mg/0.3 mL IJ SOAJ injection USE AS DIRECTED    ezetimibe (ZETIA) 10 MG tablet TAKE 1 TABLET BY MOUTH EVERY DAY   fluticasone (FLONASE) 50 MCG/ACT nasal spray Place 2 sprays into both nostrils daily.   isosorbide mononitrate (IMDUR) 30 MG 24 hr tablet Take 1 tablet (30 mg total) by mouth 2 (two) times daily at 10 AM and 5 PM. NEEDS FOLLOW UP APPOINTMENT FOR MORE REFILLS   mexiletine (MEXITIL) 150 MG capsule TAKE TWO CAPSULES BY MOUTH THREE TIMES DAILY   Multiple Vitamins-Minerals (MULTIVITAMINS THER. W/MINERALS) TABS Take 1 tablet by mouth daily.     nitroGLYCERIN (NITROSTAT) 0.4 MG SL tablet PLACE 1 TABLET UNDER THE TONGUE EVERY 5 MINUTES FOR 3 DOSES AS NEEDED CHEST PAIN   oxyCODONE-acetaminophen (PERCOCET) 10-325 MG tablet Take 1 tablet by mouth every 4 (four) hours as needed for pain.   pantoprazole (PROTONIX) 40 MG tablet TAKE 1 TABLET BY MOUTH DAILY   potassium chloride SA (KLOR-CON M) 20 MEQ tablet TAKE 2 TABLETS BY MOUTH EVERY MORNING and TAKE 1 TABLET EVERY EVENING (Patient taking differently: Take 20 mEq by mouth 3 (three) times daily.)   pravastatin (PRAVACHOL) 20 MG tablet Take 1 tablet (20 mg total) by mouth every evening. (Patient taking differently: Take 20 mg by mouth as directed. 4 x per week)   promethazine (PHENERGAN) 25 MG tablet Take 25 mg by mouth every 8 (eight) hours as needed.   sacubitril-valsartan (ENTRESTO) 24-26 MG  Take 1 tablet by mouth 2 (two) times daily.   sennosides-docusate sodium (SENOKOT-S) 8.6-50 MG tablet Take 1 tablet by mouth daily as needed.   spironolactone (ALDACTONE) 25 MG tablet Take 1 tablet (25 mg total) by mouth daily. NEEDS FOLLOW UP APPOINTMENT FOR MORE REFILLS   torsemide (DEMADEX) 20 MG tablet Take 1 tablet (20 mg total) by mouth 4 (four) times daily for 2 days. May take an additional 20 mg tablet daily for swelling (Patient taking differently: Takes 2 tabs every morning, 1 at lunch, & 1 at 7 pm)   traZODone (DESYREL) 100 MG tablet Take 100 mg by mouth at bedtime as needed for sleep.   Vericiguat  (VERQUVO) 5 MG TABS Take 1 tablet (5 mg total) by mouth daily.   warfarin (COUMADIN) 5 MG tablet Take 1/2 TO 1 tablet daily OR as directed by coumadin clinic   [DISCONTINUED] carvedilol (COREG) 12.5 MG tablet Take 1 tablet (12.5 mg total) by mouth 2 (two) times daily with a meal.   No facility-administered encounter medications on file as of 08/26/2023.   Past Medical History:  Diagnosis Date   Aneurysm of internal carotid artery    Anti-phospholipid syndrome (HCC)    Anticardiolipin antibody positive    Chronic Coumadin   Brain aneurysm    followed by Dr. Corliss Skains   Chronic combined systolic (congestive) and diastolic (congestive) heart failure (HCC)    Cocaine abuse in remission [facility])    Coronary atherosclerosis of native coronary artery    Previous invasive cardiac testing was done at a facility in [place], [place].  Occluded diagonal, Diffuse LAD disease, Occluded Cx, and non obstructive RCA.    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 Dutch Quint L1 cage, 5 disc fusion   CARDIAC DEFIBRILLATOR PLACEMENT  2008   [employer] ICD   CARDIOVERSION N/A 12/01/2020   Procedure: CARDIOVERSION;  Surgeon: [name], MD;  Location: [facility] ENDOSCOPY;  Service: Cardiovascular;  Laterality: N/A;   CORONARY STENT INTERVENTION N/A 01/08/2021    Procedure: CORONARY STENT INTERVENTION;  Surgeon: Swaziland, Peter M, MD;  Location: [facility] INVASIVE CV LAB;  Service: Cardiovascular;  Laterality: N/A;   EP IMPLANTABLE DEVICE N/A 03/01/2016   Procedure: ICD Generator Changeout;  Surgeon: [name], MD;  Location: [facility] INVASIVE CV LAB;  Service: Cardiovascular;  Laterality: N/A;   ICD/BIV ICD FUNCTION(DFT) TEST N/A 03/14/2021   Procedure: ICD/BIV ICD FUNCTION (DFT) TEST;  Surgeon: [name], MD;  Location: [facility] INVASIVE CV LAB;  Service: Cardiovascular;  Laterality: N/A;   IR ANGIO INTRA EXTRACRAN SEL COM CAROTID INNOMINATE BILAT MOD SED  05/29/2023   IR ANGIO VERTEBRAL SEL SUBCLAVIAN INNOMINATE BILAT MOD SED  05/29/2023   IR RADIOLOGIST EVAL & MGMT  04/16/2023   IR US  GUIDE VASC ACCESS RIGHT  05/29/2023   L1 corpectomy   11/29/2009   Laryngeal polyp excision     RIGHT/LEFT HEART CATH AND CORONARY ANGIOGRAPHY N/A 12/18/2020   Procedure: RIGHT/LEFT HEART CATH AND CORONARY ANGIOGRAPHY;  Surgeon: Dolores Patty, MD;  Location: [facility] INVASIVE CV LAB;  Service: Cardiovascular;  Laterality: N/A;   RIGHT/LEFT HEART CATH AND CORONARY ANGIOGRAPHY N/A 03/05/2021   Procedure: RIGHT/LEFT HEART CATH AND CORONARY ANGIOGRAPHY;  Surgeon: [name], MD;  Location: [facility] INVASIVE CV LAB;  Service: Cardiovascular;  Laterality: N/A;   TEE WITHOUT CARDIOVERSION N/A 12/01/2020   Procedure: TRANSESOPHAGEAL ECHOCARDIOGRAM (TEE);  Surgeon: [name], MD;  Location: [facility] ENDOSCOPY;  Service: Cardiovascular;  Laterality: N/AFloyce Stakes ABLATION N/A 03/08/2021   Procedure: Floyce Stakes ABLATION;  Surgeon: Lanier Prude, MD;  Location: [facility] INVASIVE CV LAB;  Service: Cardiovascular;  Laterality: N/A;    Family History  Problem Relation Age of Onset   Uterine cancer Mother    Heart disease Father 82   Hyperlipidemia Father    Hypertension Father    Ankylosing spondylitis Sister    Heart disease Sister    Hypertension Sister    Stomach cancer Brother 85   Heart  attack Brother    Social History   Socioeconomic History   Marital status: Single    Spouse name: Not on file   Number of children: Not on file   Years of education: Not on file   Highest education level: Master's degree (e.g., MA, MS, MEng, MEd, MSW, MBA)  Occupational History   Occupation: Disabled    Comment: ESL  Tobacco Use   Smoking status: Former    Current packs/day: 0.00    Average packs/day: 0.3 packs/day for 43.4 years (10.8 ttl pk-yrs)    Types: Cigarettes, E-cigarettes    Start date: 03/03/1978    Quit date: 07/21/2021    Years since quitting: 2.0   Smokeless tobacco: Current   Tobacco comments:    4 ciggs per day  Vaping Use   Vaping status: Every Day  Substance and Sexual Activity   Alcohol use: No    Alcohol/week: 0.0 standard drinks of alcohol   Drug use: No    Comment: Prior history of cocaine   Sexual activity: Not Currently    Birth control/protection: None  Other Topics Concern   Not on file  Social History Narrative   Not on file   Social Determinants of Health   Financial Resource Strain: Low Risk  (04/25/2023)   Overall Financial Resource Strain (CARDIA)    Difficulty of Paying Living Expenses: Not very hard  Food Insecurity: No Food Insecurity (04/25/2023)   Hunger Vital Sign    Worried About Running Out of Food in the Last Year: Never true    Ran Out of Food in the Last Year: Never true  Transportation Needs: No Transportation Needs (04/25/2023)   PRAPARE - Administrator, Civil Service (Medical): No    Lack of Transportation (Non-Medical): No  Physical Activity: Unknown (04/25/2023)   Exercise Vital Sign    Days of Exercise per Week: 0 days    Minutes of Exercise per Session: Not on file  Stress: Stress Concern Present (04/25/2023)   [employer] of Occupational Health - Occupational Stress Questionnaire    Feeling of Stress : Rather much  Social Connections: Moderately Isolated (04/25/2023)   Social Connection and Isolation  Panel [NHANES]    Frequency of Communication with Friends  and Family: More than three times a week    Frequency of Social Gatherings with Friends and Family: Once a week    Attends Religious Services: More than 4 times per year    Active Member of [employer] or Organizations: No    Attends Engineer, structural: Not on file    Marital Status: Never married  Intimate Partner Violence: Not on file   ROS As per HPI  Objective   BP 115/74   Pulse 66   Temp 98 F (36.7 C)   Ht 5\' 5"  (1.651 m)   Wt 214 lb (97.1 kg)   SpO2 96%   BMI 35.61 kg/m   Physical Exam Constitutional:      General: She is awake. She is not in acute distress.    Appearance: Normal appearance. She is well-developed and well-groomed. She is obese. She is not ill-appearing, toxic-appearing or diaphoretic.  Cardiovascular:     Rate and Rhythm: Normal rate and regular rhythm.     Pulses: Normal pulses.          Radial pulses are 2+ on the right side and 2+ on the left side.       Posterior tibial pulses are 2+ on the right side and 2+ on the left side.     Heart sounds: Normal heart sounds. No murmur heard.    No gallop.  Pulmonary:     Effort: Pulmonary effort is normal. No respiratory distress.     Breath sounds: Normal breath sounds. No stridor. No wheezing, rhonchi or rales.  Musculoskeletal:     Cervical back: Full passive range of motion without pain and neck supple.     Right lower leg: No edema.     Left lower leg: No edema.  Skin:    General: Skin is warm.     Capillary Refill: Capillary refill takes less than 2 seconds.  Neurological:     General: No focal deficit present.     Mental Status: She is alert, oriented to person, place, and time and easily aroused. Mental status is at baseline.     GCS: GCS eye subscore is 4. GCS verbal subscore is 5. GCS motor subscore is 6.     Motor: No weakness.  Psychiatric:        Attention and Perception: Attention and perception normal.        Mood and  Affect: Mood and affect normal.        Speech: Speech normal.        Behavior: Behavior normal. Behavior is cooperative.        Thought Content: Thought content normal. Thought content does not include homicidal or suicidal ideation. Thought content does not include homicidal or suicidal plan.        Cognition and Memory: Cognition and memory normal.        Judgment: Judgment normal.       08/26/2023   11:30 AM 04/28/2023    2:32 PM 03/01/2022    8:19 AM  Depression screen PHQ 2/9  Decreased Interest 0 3 0  Down, Depressed, Hopeless 0 1 0  PHQ - 2 Score 0 4 0  Altered sleeping 0 3 2  Tired, decreased energy 3 2 0  Change in appetite 2 2 2   Feeling bad or failure about yourself  0 0 0  Trouble concentrating 0 0 0  Moving slowly or fidgety/restless 0 0 0  Suicidal thoughts 0 0 0  PHQ-9 Score 5 11  4  Difficult doing work/chores  Somewhat difficult Somewhat difficult      08/26/2023   11:31 AM 04/28/2023    2:33 PM 11/15/2021    1:22 PM 10/09/2021    3:08 PM  GAD 7 : Generalized Anxiety Score  Nervous, Anxious, on Edge 0 2 2 1   Control/stop worrying 0 1 1 0  Worry too much - different things 0 0 1 0  Trouble relaxing 0 0 0 1  Restless 0 0 0 0  Easily annoyed or irritable 0 0 1 1  Afraid - awful might happen 0  1 1  Total GAD 7 Score 0  6 4  Anxiety Difficulty Not difficult at all Somewhat difficult Somewhat difficult Somewhat difficult   Assessment & Plan:  1. Tobacco dependence Encouraged patient to continue to decrease and eventually eliminate use of tobacco.   2. 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 Declines referral at this time.   4. Peripheral vascular disease (HCC) Patient does not follow with vascular. Followed by Cardiology, EP, and radiology.   5. Right carotid artery occlusion Patient to continue to  follow up with Radiologist, Deveshwar. Reviewed imaging as below.   IMPRESSION: No change in the occluded right internal carotid artery at the bulb.   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   Right middle cerebral artery and the right anterior cerebral artery territories opacify via the anterior communicating artery from the left ICA, and via leptomeningeal collaterals arising from the right posterior cerebral artery P3 segment in the posterior temporal branch, and via reconstitution of the distal right ICA in the cavernous segment as described above.   PLAN: Follow-up CT angiogram of the head and neck in a years time.   Electronically Signed   By: Julieanne Cotton M.D.   On: 05/31/2023 11:00  6. Dual implantable cardioverter-defibrillator in situ Patient to follow up with Ladona Ridgel, EP.   7. Chronic pain syndrome Managed by [facility].   8. Encounter for screening for depression Pt screened positive for depression today. Pt offered nonpharmacologic therapy. Pt declined initiating treatment at this time. Safety contract established today with patient in clinic. Denies intent to harm herself or others. Pt to notify provider if they would like to initiate treatment. Reports that she takes cymbalta for pain management, which helps her symptoms.  9. Fall, initial encounter Declined imaging and referrals to PT at this time. Patient to continue OTC and at home treatment options. Patient to follow up if symptoms continue.   10. BMI 35.0-35.9,adult Encouraged patient to continue chair exercises. Discussed with patient to continue healthy lifestyle choices, including diet (rich in fruits, vegetables, and lean proteins, and low in salt and simple carbohydrates) and exercise (at least 30 minutes of moderate physical activity daily). Limit beverages high is sugar.  Recommended at least 80-100 oz of water daily.   11. Encounter for vaccination - Tdap vaccine greater than or equal to 7yo IM - Zoster Recombinant (Shingrix )  The above assessment and management plan was discussed with the patient. The patient verbalized understanding of and has agreed to the management plan using shared-decision making. Patient is aware to call the clinic if they develop any new symptoms or if symptoms fail to improve or worsen. Patient is aware when  to return to the clinic for a follow-up visit. Patient educated on when it is appropriate to go to the emergency department.   Return in about 6 months (around 02/26/2024), or needs to schedule AWV, for Chronic Condition Follow up.   Neale Burly, DNP-FNP [facility] [address] [place], [place] [address] [phone]

## 2023-08-26 NOTE — Addendum Note (Signed)
Addended by: Neale Burly on: 08/26/2023 04:33 PM   Modules accepted: Level of Service

## 2023-08-26 NOTE — Addendum Note (Signed)
Addended by: Angela Nevin D on: 08/26/2023 04:28 PM   Modules accepted: Orders

## 2023-08-29 ENCOUNTER — Ambulatory Visit: Payer: Medicare PPO

## 2023-09-08 ENCOUNTER — Ambulatory Visit: Payer: PPO

## 2023-09-08 ENCOUNTER — Ambulatory Visit: Payer: PPO | Attending: Cardiology | Admitting: *Deleted

## 2023-09-08 DIAGNOSIS — I4819 Other persistent atrial fibrillation: Secondary | ICD-10-CM | POA: Diagnosis not present

## 2023-09-08 DIAGNOSIS — Z5181 Encounter for therapeutic drug level monitoring: Secondary | ICD-10-CM | POA: Diagnosis not present

## 2023-09-08 DIAGNOSIS — D6859 Other primary thrombophilia: Secondary | ICD-10-CM | POA: Diagnosis not present

## 2023-09-08 LAB — POCT INR: INR: 2.4 (ref 2.0–3.0)

## 2023-09-08 NOTE — Patient Instructions (Signed)
Continue warfarin 1/2 tablet daily except 1 tablet on Mondays, Wednesdays and Fridays. Recheck 6 weeks

## 2023-09-10 ENCOUNTER — Other Ambulatory Visit (HOSPITAL_COMMUNITY): Payer: Self-pay | Admitting: Cardiology

## 2023-09-10 ENCOUNTER — Ambulatory Visit (INDEPENDENT_AMBULATORY_CARE_PROVIDER_SITE_OTHER): Payer: PPO

## 2023-09-10 DIAGNOSIS — I255 Ischemic cardiomyopathy: Secondary | ICD-10-CM | POA: Diagnosis not present

## 2023-09-10 DIAGNOSIS — I5020 Unspecified systolic (congestive) heart failure: Secondary | ICD-10-CM

## 2023-09-10 LAB — CUP PACEART REMOTE DEVICE CHECK
Battery Remaining Longevity: 84 mo
Battery Remaining Percentage: 92 %
Brady Statistic RA Percent Paced: 4 %
Brady Statistic RV Percent Paced: 2 %
Date Time Interrogation Session: 20240911151500
HighPow Impedance: 51 Ohm
Implantable Lead Connection Status: 753985
Implantable Lead Connection Status: 753985
Implantable Lead Implant Date: 20081119
Implantable Lead Implant Date: 20081119
Implantable Lead Location: 753859
Implantable Lead Location: 753860
Implantable Lead Model: 185
Implantable Lead Model: 4136
Implantable Lead Serial Number: 211124
Implantable Lead Serial Number: 28383796
Implantable Pulse Generator Implant Date: 20170303
Lead Channel Impedance Value: 493 Ohm
Lead Channel Impedance Value: 575 Ohm
Lead Channel Pacing Threshold Amplitude: 0.7 V
Lead Channel Pacing Threshold Amplitude: 1.2 V
Lead Channel Pacing Threshold Pulse Width: 0.5 ms
Lead Channel Pacing Threshold Pulse Width: 0.5 ms
Lead Channel Setting Pacing Amplitude: 2 V
Lead Channel Setting Pacing Amplitude: 2.4 V
Lead Channel Setting Pacing Pulse Width: 0.5 ms
Lead Channel Setting Sensing Sensitivity: 0.6 mV
Pulse Gen Serial Number: 204398

## 2023-09-11 ENCOUNTER — Encounter: Payer: Self-pay | Admitting: Nurse Practitioner

## 2023-09-11 ENCOUNTER — Ambulatory Visit: Payer: PPO | Attending: Nurse Practitioner | Admitting: Nurse Practitioner

## 2023-09-11 VITALS — BP 120/80 | HR 59 | Wt 213.8 lb

## 2023-09-11 DIAGNOSIS — I340 Nonrheumatic mitral (valve) insufficiency: Secondary | ICD-10-CM | POA: Diagnosis not present

## 2023-09-11 DIAGNOSIS — Z8673 Personal history of transient ischemic attack (TIA), and cerebral infarction without residual deficits: Secondary | ICD-10-CM

## 2023-09-11 DIAGNOSIS — Z8679 Personal history of other diseases of the circulatory system: Secondary | ICD-10-CM | POA: Diagnosis not present

## 2023-09-11 DIAGNOSIS — Z95810 Presence of automatic (implantable) cardiac defibrillator: Secondary | ICD-10-CM

## 2023-09-11 DIAGNOSIS — I779 Disorder of arteries and arterioles, unspecified: Secondary | ICD-10-CM | POA: Diagnosis not present

## 2023-09-11 DIAGNOSIS — I255 Ischemic cardiomyopathy: Secondary | ICD-10-CM

## 2023-09-11 DIAGNOSIS — R142 Eructation: Secondary | ICD-10-CM | POA: Diagnosis not present

## 2023-09-11 DIAGNOSIS — E669 Obesity, unspecified: Secondary | ICD-10-CM | POA: Diagnosis not present

## 2023-09-11 DIAGNOSIS — I5020 Unspecified systolic (congestive) heart failure: Secondary | ICD-10-CM | POA: Diagnosis not present

## 2023-09-11 DIAGNOSIS — K219 Gastro-esophageal reflux disease without esophagitis: Secondary | ICD-10-CM | POA: Diagnosis not present

## 2023-09-11 DIAGNOSIS — I25119 Atherosclerotic heart disease of native coronary artery with unspecified angina pectoris: Secondary | ICD-10-CM | POA: Diagnosis not present

## 2023-09-11 DIAGNOSIS — I4819 Other persistent atrial fibrillation: Secondary | ICD-10-CM | POA: Diagnosis not present

## 2023-09-11 MED ORDER — ISOSORBIDE MONONITRATE ER 30 MG PO TB24: ORAL_TABLET | ORAL | 2 refills | Status: DC

## 2023-09-11 NOTE — Patient Instructions (Addendum)
Medication Instructions:  Your physician has recommended you make the following change in your medication:  Take IMDUR 1.5 Tablets (45 Mg) total in the morning, take 1 (30 Mg tablet) total in the evening  Continue all other medications as prescribed   Labwork: None  Testing/Procedures: None  Follow-Up: Your physician recommends that you schedule a follow-up appointment in: 6 weeks   Any Other Special Instructions Will Be Listed Below (If Applicable).  If you need a refill on your cardiac medications before your next appointment, please call your pharmacy.

## 2023-09-11 NOTE — Progress Notes (Signed)
Cardiology Office Note:  .   Date:  09/11/2023 ID:  [name] [name], DOB 1961/07/22, MRN [id number] PCP: [name] Spencer, FNP  [name] [name] Cardiologist:  [name] Dell, MD Electrophysiologist:  [name] Bunting, MD    History of Present Illness: .   [name] [name] is a 62 y.o. female with a PMH of HFrEF, ischemic cardiopathy, CAD, persistent A-fib, s/p TEE/cardioversion, carotid artery disease, antiphospholipid antibody syndrome with history of prior stroke, history of recurrent VTE, s/p VT ablation and [employer] ICD in place, history of mitral valve regurgitation,  who presents today for  swelling and weight gain evaluation.  She is closely followed at the heart failure clinic as well as followed by Dr. Ladona Ridgel with EP.  Last seen by Dr. Diona Browner on June 30, 2023.  At that time, she was doing well from a cardiac perspective.  Seen by me on July 25, 2023.  Patient noticed weight fluctuating, had been taking extra Torsemide d/t this. Also noticed more shortness of breath that she attributed to the summer humidity.  Stated the last several nights she has waking up and taking nitroglycerin, chest pain waking her up from her sleep. When describing CP, said along her upper back, and doesn't describe it as a tearing sensation or painful, described as a fullness and a pressure along her back, wondered it was related to gas/indigestion as burping/belching helped.  She says she has also experienced radiation to her neck, however past MI she experienced back pain. NTG helped her symptoms. Denied any palpitations, syncope, presyncope, dizziness, orthopnea, PND, swelling acute bleeding, or claudication.  Labs obtained and came to shared medical decision with patient to increase Imdur to 30 mg twice daily.  Today she presents for follow-up.  She is recently dealing with sinus issues but doing better from when I last saw her.  Continues to note belching, feeling bloated, and which she believes  are gas symptoms.  She had 1 episode of chest pain that woke her up prior to increasing her Imdur, says after she made the medication change as last prescribed at last office visit, she has not had any more symptoms.  She does have some GI symptoms (belching), says she had this with her previous MI, and wonders if this is related to her heart. Denies any shortness of breath, palpitations, syncope, presyncope, dizziness, orthopnea, PND, significant weight changes, acute bleeding, or claudication.  Overall her weight log shows recent stable readings.  Does admit to some swelling along her right lower extremity, has some arthritis in her right knee.   Studies Reviewed: .    Echo 06/2023:  1. Left ventricular ejection fraction, by estimation, is 30 to 35%. The  left ventricle has moderately decreased function. The left ventricle  demonstrates regional wall motion abnormalities (see scoring  diagram/findings for description). The left  ventricular internal cavity size was moderately dilated. Left ventricular  diastolic parameters are consistent with Grade I diastolic dysfunction  (impaired relaxation).   2. Right ventricular systolic function is normal. The right ventricular  size is normal. There is normal pulmonary artery systolic pressure.   3. Left atrial size was mild to moderately dilated.   4. The mitral valve is normal in structure. Mild mitral valve  regurgitation. No evidence of mitral stenosis.   5. The aortic valve has an indeterminant number of cusps. Aortic valve  regurgitation is not visualized. No aortic stenosis is present.   6. The inferior vena cava is normal in size  with greater than 50%  respiratory variability, suggesting right atrial pressure of 3 mmHg.   Comparison(s): No significant change from prior study. Prior images  reviewed side by side.  Carotid doppler 06/2023: Summary:  Right Carotid: Evidence consistent with a total occlusion of the right  ICA.   Left  Carotid: Velocities in the left ICA are consistent with a 1-39%  stenosis.   Vertebrals: Bilateral vertebral arteries demonstrate antegrade flow.  Subclavians: Normal flow hemodynamics were seen in bilateral subclavian arteries.   Right/Left heart cath 02/2021: 1. Low filling pressures.  2. Preserved cardiac output.  3. Stable coronaries, patent LCx stent.  Known chronic occlusion of D1.  Physical Exam:   VS:  BP 120/80   Pulse (!) 59   Wt 213 lb 12.8 oz (97 kg)   SpO2 98%   BMI 35.58 kg/m    Wt Readings from Last 3 Encounters:  09/11/23 213 lb 12.8 oz (97 kg)  08/26/23 214 lb (97.1 kg)  07/25/23 214 lb 3.2 oz (97.2 kg)    GEN: Obese, 62 y.o. female in no acute distress NECK: No JVD; No carotid bruits CARDIAC: S1/S2, RRR, no murmurs, rubs, gallops RESPIRATORY:  Clear to auscultation without rales, wheezing or rhonchi  ABDOMEN: Soft, non-tender, non-distended EXTREMITIES:  Nonpitting edema to BLE (RLE>LLE); No deformity   ASSESSMENT AND PLAN: .    CAD R/LHC in 2022 revealed stable coronary arteries, patent left circumflex stent with known chronic occlusion of D1.  Recent improvement in symptoms after medical management after last OV.  Discussed options regarding medical management, ischemic evaluation, or cardiac catheterization.  We came to the shared medical decision to increase AM dose to Imdur to 45 mg, continue Imdur 30 mg in PM.  Continue carvedilol, Zetia, pravastatin, and NTG PRN. Heart healthy diet and regular cardiovascular exercise encouraged. ED precautions discussed.  2. HFrEF, ICM, leg edema, medication management Stage C, NYHA class II-III symptoms. TTE 06/2023 revealed EF 30-35%. Recent weights stable, leg edema remains stable (chronic RLE > LLE), most likely d/t arthritis. Continue Torsemide and potassium supplement, Jardiance, Aldactone, Entresto, Verquvo, and Coreg. Increasing Imdur as mentioned above. Recommended leg elevation and compression stockings. Low sodium  diet, fluid restriction <2L, and daily weights encouraged. Educated to contact our office for weight gain of 2 lbs overnight or 5 lbs in one week.   3. Persistent A-fib Hx of TEE/DCCV 11/2020.  Denies any tachycardia or palpitations.  Continue amiodarone and carvedilol.  Heart rate is well-controlled today.  She is tolerating warfarin well.  Continue to follow-up at the Coumadin clinic as scheduled.   4. 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 This appears to be stable from previous study. Continue pravastatin and Zetia. Not on Aspirin d/t being on Warfarin. Hx of antiphospholipid antibody syndrome. Denies any symptoms. Continue to follow with PCP.  5. Hx of recurrent VT, s/p VT ablation and ICD in place Denies any symptoms or events.  Continue mexiletine and amiodarone.  Continue to follow-up with Dr. Ladona Ridgel. Recent LFT's normal. Will request most recent labs at next OV from PCP's office.  6. Mitral valve regurgitation Mild mitral valve regurgitation seen TTE 06/2023. Plan to update TTE in 3-5 years or sooner if clinically indicated.    7. Obesity Weight loss via diet and exercise encouraged. Discussed the impact being overweight would have on cardiovascular risk.  8.  Belching, GERD Continue pantoprazole. Recommended to follow-up with PCP regarding this, may benefit from a GI referral for her symptoms. She verbalized understanding.   Dispo: Follow-up with me or APP in 6 weeks or sooner if anything changes.  Signed, Sharlene Dory, NP

## 2023-09-15 ENCOUNTER — Ambulatory Visit: Payer: PPO | Admitting: Nurse Practitioner

## 2023-09-16 ENCOUNTER — Other Ambulatory Visit: Payer: Self-pay | Admitting: Cardiology

## 2023-09-16 ENCOUNTER — Encounter: Payer: Self-pay | Admitting: Cardiology

## 2023-09-16 DIAGNOSIS — I6521 Occlusion and stenosis of right carotid artery: Secondary | ICD-10-CM

## 2023-09-17 ENCOUNTER — Other Ambulatory Visit: Payer: Self-pay | Admitting: Family Medicine

## 2023-09-17 DIAGNOSIS — F411 Generalized anxiety disorder: Secondary | ICD-10-CM | POA: Diagnosis not present

## 2023-09-17 DIAGNOSIS — E6609 Other obesity due to excess calories: Secondary | ICD-10-CM | POA: Diagnosis not present

## 2023-09-17 DIAGNOSIS — R5383 Other fatigue: Secondary | ICD-10-CM | POA: Diagnosis not present

## 2023-09-17 DIAGNOSIS — Z79899 Other long term (current) drug therapy: Secondary | ICD-10-CM | POA: Diagnosis not present

## 2023-09-17 DIAGNOSIS — Z6835 Body mass index (BMI) 35.0-35.9, adult: Secondary | ICD-10-CM | POA: Diagnosis not present

## 2023-09-17 DIAGNOSIS — M47816 Spondylosis without myelopathy or radiculopathy, lumbar region: Secondary | ICD-10-CM | POA: Diagnosis not present

## 2023-09-17 DIAGNOSIS — R110 Nausea: Secondary | ICD-10-CM | POA: Diagnosis not present

## 2023-09-17 DIAGNOSIS — G8929 Other chronic pain: Secondary | ICD-10-CM | POA: Diagnosis not present

## 2023-09-17 DIAGNOSIS — M1711 Unilateral primary osteoarthritis, right knee: Secondary | ICD-10-CM | POA: Diagnosis not present

## 2023-09-17 DIAGNOSIS — M549 Dorsalgia, unspecified: Secondary | ICD-10-CM | POA: Diagnosis not present

## 2023-09-18 ENCOUNTER — Ambulatory Visit (INDEPENDENT_AMBULATORY_CARE_PROVIDER_SITE_OTHER): Payer: PPO

## 2023-09-18 VITALS — Ht 65.0 in | Wt 212.0 lb

## 2023-09-18 DIAGNOSIS — Z0000 Encounter for general adult medical examination without abnormal findings: Secondary | ICD-10-CM

## 2023-09-18 NOTE — Progress Notes (Signed)
Subjective:   [name] [name] is a 62 y.o. female who presents for an Initial Medicare Annual Wellness Visit.  Visit Complete: Virtual  I connected with  [name] [name] on 09/18/23 by a audio enabled telemedicine application and verified that I am speaking with the correct person using two identifiers.  Patient Location: Home  Provider Location: Home Office  I discussed the limitations of evaluation and management by telemedicine. The patient expressed understanding and agreed to proceed.  Patient Medicare AWV questionnaire was completed by the patient on 09/18/2023; I have confirmed that all information answered by patient is correct and no changes since this date. Vital Signs: Unable to obtain new vitals due to this being a telehealth visit.        Objective:    Today's Vitals   09/18/23 1304  Weight: 212 lb (96.2 kg)  Height: 5\' 5"  (1.651 m)   Body mass index is 35.28 kg/m.     09/18/2023    1:08 PM 05/29/2023    7:17 AM 04/17/2023   12:31 AM 06/23/2021    4:18 PM 04/04/2021    4:29 PM 03/02/2021    8:02 AM 03/01/2021    8:52 PM  Advanced Directives  Does Patient Have a Medical Advance Directive? Yes Yes No Yes Yes No No  Type of Estate agent of [place];Living will Healthcare Power of [place];Living will  Healthcare Power of Attorney     Does patient want to make changes to medical advance directive? No - Patient declined No - Patient declined       Copy of Healthcare Power of Attorney in Chart? Yes - validated most recent copy scanned in chart (See row information) No - copy requested       Would patient like information on creating a medical advance directive?      No - Patient declined     Current Medications (verified) Outpatient Encounter Medications as of 09/18/2023  Medication Sig   amiodarone (PACERONE) 200 MG tablet Take 1.5 tablets by mouth Monday through Thursday.  Take 2 tablets by mouth Friday through Sunday.   carvedilol (COREG) 12.5 MG  tablet Take 1 tablet (12.5 mg total) by mouth 2 (two) times daily with a meal.   cetirizine (ZYRTEC ALLERGY) 10 MG tablet Take 1 tablet (10 mg total) by mouth daily.   DULoxetine (CYMBALTA) 60 MG capsule Take 60 mg by mouth daily.   empagliflozin (JARDIANCE) 10 MG TABS tablet Take 1 tablet (10 mg total) by mouth daily before breakfast.   EPINEPHRINE 0.3 mg/0.3 mL IJ SOAJ injection USE AS DIRECTED   ezetimibe (ZETIA) 10 MG tablet TAKE 1 TABLET BY MOUTH EVERY DAY   fluticasone (FLONASE) 50 MCG/ACT nasal spray INSTILL 2 SPRAYS IN EACH NOSTRIL EVERY DAY   isosorbide mononitrate (IMDUR) 30 MG 24 hr tablet Take 1.5 tablets (45 Mg total) in the morning, and take 1 tablet (30 Mg) total in the evening   mexiletine (MEXITIL) 150 MG capsule TAKE TWO CAPSULES BY MOUTH THREE TIMES DAILY   Multiple Vitamins-Minerals (MULTIVITAMINS THER. W/MINERALS) TABS Take 1 tablet by mouth daily.     nitroGLYCERIN (NITROSTAT) 0.4 MG SL tablet PLACE 1 TABLET UNDER THE TONGUE EVERY 5 MINUTES FOR 3 DOSES AS NEEDED CHEST PAIN   oxyCODONE-acetaminophen (PERCOCET) 10-325 MG tablet Take 1 tablet by mouth every 4 (four) hours as needed for pain.   pantoprazole (PROTONIX) 40 MG tablet TAKE 1 TABLET BY MOUTH DAILY   potassium chloride SA (KLOR-CON M)  20 MEQ tablet TAKE 2 TABLETS BY MOUTH EVERY MORNING and TAKE 1 TABLET EVERY EVENING (Patient taking differently: Take 20 mEq by mouth 3 (three) times daily.)   pravastatin (PRAVACHOL) 20 MG tablet Take 1 tablet (20 mg total) by mouth every evening. (Patient taking differently: Take 20 mg by mouth as directed. 4 x per week)   promethazine (PHENERGAN) 25 MG tablet Take 25 mg by mouth every 8 (eight) hours as needed.   sacubitril-valsartan (ENTRESTO) 24-26 MG Take 1 tablet by mouth 2 (two) times daily.   sennosides-docusate sodium (SENOKOT-S) 8.6-50 MG tablet Take 1 tablet by mouth daily as needed.   spironolactone (ALDACTONE) 25 MG tablet TAKE 1 TABLET BY MOUTH EVERY DAY   torsemide  (DEMADEX) 20 MG tablet Take 1 tablet (20 mg total) by mouth 4 (four) times daily for 2 days. May take an additional 20 mg tablet daily for swelling (Patient taking differently: Takes 2 tabs every morning, 1 at lunch, & 1 at 7 pm)   traZODone (DESYREL) 100 MG tablet Take 100 mg by mouth at bedtime as needed for sleep.   Vericiguat (VERQUVO) 5 MG TABS Take 1 tablet (5 mg total) by mouth daily.   warfarin (COUMADIN) 5 MG tablet Take 1/2 TO 1 tablet daily OR as directed by coumadin clinic   No facility-administered encounter medications on file as of 09/18/2023.    Allergies (verified) Beef-derived products, Metoclopramide hcl, Penicillins, Pork-derived products, Metoprolol, and Statins   History: Past Medical History:  Diagnosis Date   Aneurysm of internal carotid artery    Anti-phospholipid syndrome (HCC)    Anticardiolipin antibody positive    Chronic Coumadin   Brain aneurysm    followed by Dr. 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  Occluded diagonal, Diffuse LAD disease, Occluded Cx, and non obstructive RCA.    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 Dutch Quint L1 cage, 5 disc fusion   CARDIAC DEFIBRILLATOR PLACEMENT  2008   [employer] ICD   CARDIOVERSION N/A 12/01/2020   Procedure: CARDIOVERSION;  Surgeon: [name], MD;  Location: [facility] ENDOSCOPY;  Service: Cardiovascular;  Laterality: N/A;   CORONARY STENT INTERVENTION N/A 01/08/2021   Procedure: CORONARY STENT INTERVENTION;  Surgeon: [place], Peter M, MD;  Location: [facility] INVASIVE CV LAB;  Service: Cardiovascular;  Laterality: N/A;   EP IMPLANTABLE DEVICE N/A 03/01/2016   Procedure: ICD Generator Changeout;  Surgeon: [name], MD;  Location: [facility] INVASIVE CV LAB;  Service: Cardiovascular;  Laterality: N/A;   ICD/BIV ICD FUNCTION(DFT) TEST N/A 03/14/2021  Procedure: ICD/BIV ICD FUNCTION (DFT) TEST;  Surgeon: [name], MD;  Location: [facility] INVASIVE CV LAB;  Service: Cardiovascular;  Laterality: N/A;   IR ANGIO INTRA EXTRACRAN SEL COM CAROTID INNOMINATE BILAT MOD SED  05/29/2023   IR ANGIO VERTEBRAL SEL SUBCLAVIAN INNOMINATE BILAT MOD SED  05/29/2023   IR RADIOLOGIST EVAL & MGMT  04/16/2023   IR US GUIDE VASC ACCESS RIGHT  05/29/2023   L1 corpectomy   11/29/2009   Laryngeal polyp excision     RIGHT/LEFT HEART CATH AND CORONARY ANGIOGRAPHY N/A 12/18/2020   Procedure: RIGHT/LEFT HEART CATH AND CORONARY ANGIOGRAPHY;  Surgeon: Dolores Patty, MD;  Location: [facility] INVASIVE CV LAB;  Service: Cardiovascular;  Laterality: N/A;   RIGHT/LEFT HEART CATH AND CORONARY ANGIOGRAPHY N/A 03/05/2021   Procedure: RIGHT/LEFT HEART CATH AND CORONARY ANGIOGRAPHY;  Surgeon: [name], MD;  Location: [facility] INVASIVE CV LAB;  Service: Cardiovascular;  Laterality: N/A;   TEE WITHOUT CARDIOVERSION N/A 12/01/2020   Procedure: TRANSESOPHAGEAL ECHOCARDIOGRAM (TEE);  Surgeon: [name], MD;  Location: [facility] ENDOSCOPY;  Service: Cardiovascular;  Laterality: N/AFloyce Stakes ABLATION N/A 03/08/2021   Procedure: Floyce Stakes ABLATION;  Surgeon: Lanier Prude, MD;  Location: [facility] INVASIVE CV LAB;  Service: Cardiovascular;  Laterality: N/A;   Family History  Problem Relation Age of Onset   Uterine cancer Mother    Heart disease Father 41   Hyperlipidemia Father    Hypertension Father    Ankylosing spondylitis Sister    Heart disease Sister    Hypertension Sister    Stomach cancer Brother 31   Heart attack Brother    Social History   Socioeconomic History   Marital status: Single    Spouse name: Not on file   Number of children: Not on file   Years of education: Not on file   Highest education level: Master's degree (e.g., MA, MS, MEng, MEd, MSW, MBA)  Occupational History   Occupation: Disabled    Comment: ESL  Tobacco Use   Smoking status: Former    Current packs/day: 0.00    Average packs/day: 0.3 packs/day for 43.4 years (10.8 ttl pk-yrs)    Types: Cigarettes, E-cigarettes    Start date: 03/03/1978    Quit date: 07/21/2021    Years since quitting: 2.1   Smokeless tobacco: Current   Tobacco comments:    4 ciggs per day  Vaping Use   Vaping status: Every Day  Substance and Sexual Activity   Alcohol use: No    Alcohol/week: 0.0 standard drinks of alcohol   Drug use: No    Comment: Prior history of cocaine   Sexual activity: Not Currently    Birth control/protection: None  Other Topics Concern   Not on file  Social History Narrative   Not on file   Social Determinants of Health   Financial Resource Strain: Low Risk  (09/18/2023)   Overall Financial Resource Strain (CARDIA)    Difficulty of Paying Living Expenses: Not hard at all  Food Insecurity: No Food Insecurity (09/18/2023)   Hunger Vital Sign    Worried About Running Out of Food in the Last Year: Never true    Ran Out of Food in the Last Year: Never true  Transportation Needs: No Transportation Needs (09/18/2023)   PRAPARE - Administrator, Civil Service (Medical): No    Lack of Transportation (Non-Medical): No  Physical Activity: Inactive (09/18/2023)    Exercise Vital Sign    Days  of Exercise per Week: 0 days    Minutes of Exercise per Session: 0 min  Stress: No Stress Concern Present (09/18/2023)   [employer] of Occupational Health - Occupational Stress Questionnaire    Feeling of Stress : Not at all  Social Connections: Socially Isolated (09/18/2023)   Social Connection and Isolation Panel [NHANES]    Frequency of Communication with Friends and Family: More than three times a week    Frequency of Social Gatherings with Friends and Family: More than three times a week    Attends Religious Services: Never    Database administrator or Organizations: No    Attends Engineer, structural: Never    Marital Status: Divorced    Tobacco Counseling Ready to quit: Not Answered Counseling given: Not Answered Tobacco comments: 4 ciggs per day   Clinical Intake:  Pre-visit preparation completed: Yes  Pain : No/denies pain     Nutritional Risks: None Diabetes: No  How often do you need to have someone help you when you read instructions, pamphlets, or other written materials from your doctor or pharmacy?: 1 - Never  Interpreter Needed?: No  Information entered by :: Renie Ora, LPN   Activities of Daily Living    09/17/2023    1:31 PM 09/07/2023   10:28 AM  In your present state of health, do you have any difficulty performing the following activities:  Hearing? 0 0  Vision? 0 0  Difficulty concentrating or making decisions? 0 0  Walking or climbing stairs? 1 1  Dressing or bathing? 0 0  Doing errands, shopping? 1 1  Preparing Food and eating ? N N  Using the Toilet? N N  In the past six months, have you accidently leaked urine? Y Y  Do you have problems with loss of bowel control? N N  Managing your Medications? N N  Managing your Finances? Malvin Johns  Housekeeping or managing your Housekeeping? Malvin Johns    Patient Care Team: Junie Spencer, FNP as PCP - General (Family Medicine) [name], MD as PCP -  Cardiology (Cardiology) [name], MD as PCP - Electrophysiology (Cardiology) Beryle Beams, MD as Consulting Physician (Neurology) [name], MD as Consulting Physician (Cardiology) Ladene Artist, MD as Consulting Physician (Oncology)  Indicate any recent Medical Services you may have received from other than [facility] providers in the past year (date may be approximate).     Assessment:   This is a routine wellness examination for [name] [name].  Hearing/Vision screen Vision Screening - Comments:: Wears rx glasses - up to date with routine eye exams with  Dr.Le    Goals Addressed             This Visit's Progress    Exercise 3x per week (30 min per time)         Depression Screen    09/18/2023    1:07 PM 08/26/2023   11:30 AM 04/28/2023    2:32 PM 03/01/2022    8:19 AM 11/15/2021    1:22 PM 10/09/2021    3:07 PM 07/10/2021   12:34 PM  PHQ 2/9 Scores  PHQ - 2 Score 0 0 4 0 1 1 2   PHQ- 9 Score 0 5 11 4 5 3 8     Fall Risk    09/18/2023    1:05 PM 09/17/2023    1:31 PM 09/07/2023   10:28 AM 08/26/2023   11:32 AM 04/28/2023    2:34 PM  Fall Risk   Falls in the past year? 0 1 1 1 1   Number falls in past yr: 0 1 1 0 1  Injury with Fall? 0 0 0 1 0  Risk for fall due to : No Fall Risks   Impaired balance/gait History of fall(s)  Follow up Falls prevention discussed   Falls evaluation completed Falls evaluation completed    MEDICARE RISK AT HOME: Medicare Risk at Home Any stairs in or around the home?: No If so, are there any without handrails?: No Home free of loose throw rugs in walkways, pet beds, electrical cords, etc?: Yes Adequate lighting in your home to reduce risk of falls?: Yes Life alert?: No Use of a cane, walker or w/c?: No Grab bars in the bathroom?: Yes Shower chair or bench in shower?: Yes Elevated toilet seat or a handicapped toilet?: Yes  TIMED UP AND GO:  Was the test performed? No    Cognitive Function:        09/18/2023    1:08 PM   6CIT Screen  What Year? 0 points  What month? 0 points  What time? 0 points  Count back from 20 0 points  Months in reverse 0 points  Repeat phrase 0 points  Total Score 0 points    Immunizations Immunization History  Administered Date(s) Administered   Influenza Inj Mdck Quad Pf 10/09/2021   Influenza,inj,Quad PF,6+ Mos 11/03/2014, 12/17/2020   Influenza,inj,quad, With Preservative 09/09/2017, 09/16/2019   Influenza-Unspecified 08/30/2018   Moderna SARS-COV2 Booster Vaccination 12/26/2020   Moderna Sars-Covid-2 Vaccination 04/12/2020, 05/09/2020   Pneumococcal Polysaccharide-23 08/13/2020   Tdap 08/06/2012, 08/26/2023   Zoster Recombinant(Shingrix) 08/26/2023    TDAP status: Up to date  Flu Vaccine status: Up to date  Pneumococcal vaccine status: Up to date  Covid-19 vaccine status: Completed vaccines  Qualifies for Shingles Vaccine? Yes   Zostavax completed Yes   Shingrix Completed?: Yes  Screening Tests Health Maintenance  Topic Date Due   COVID-19 Vaccine (3 - Moderna risk series) 01/23/2021   MAMMOGRAM  06/21/2022   INFLUENZA VACCINE  07/31/2023   Hepatitis C Screening  08/25/2024 (Originally 03/24/1979)   Zoster Vaccines- Shingrix (2 of 2) 10/21/2023   Fecal DNA (Cologuard)  05/23/2024   Medicare Annual Wellness (AWV)  09/17/2024   Cervical Cancer Screening (HPV/Pap Cotest)  10/09/2026   DTaP/Tdap/Td (3 - Td or Tdap) 08/25/2033   HIV Screening  Completed   HPV VACCINES  Aged Out    Health Maintenance  Health Maintenance Due  Topic Date Due   COVID-19 Vaccine (3 - Moderna risk series) 01/23/2021   MAMMOGRAM  06/21/2022   INFLUENZA VACCINE  07/31/2023    Colorectal cancer screening: Type of screening: Cologuard. Completed 05/23/2021. Repeat every 3 years  Mammogram status: Ordered schedule 10/06/2023. Pt provided with contact info and advised to call to schedule appt.   Bone Density status: Ordered not of age . Pt provided with contact info and  advised to call to schedule appt.  Lung Cancer Screening: (Low Dose CT Chest recommended if Age 51-80 years, 20 pack-year currently smoking OR have quit w/in 15years.) does qualify.   Lung Cancer Screening Referral: declines   Additional Screening:  Hepatitis C Screening: does not qualify;  Vision Screening: Recommended annual ophthalmology exams for early detection of glaucoma and other disorders of the eye. Is the patient up to date with their annual eye exam?  Yes  Who is the provider or what is the name of the office  in which the patient attends annual eye exams? Dr.Le  If pt is not established with a provider, would they like to be referred to a provider to establish care? No .   Dental Screening: Recommended annual dental exams for proper oral hygiene   Community Resource Referral / Chronic Care Management: CRR required this visit?  No   CCM required this visit?  No     Plan:     I have personally reviewed and noted the following in the patient's chart:   Medical and social history Use of alcohol, tobacco or illicit drugs  Current medications and supplements including opioid prescriptions. Patient is currently taking opioid prescriptions. Information provided to patient regarding non-opioid alternatives. Patient advised to discuss non-opioid treatment plan with their provider. Functional ability and status Nutritional status Physical activity Advanced directives List of other physicians Hospitalizations, surgeries, and ER visits in previous 12 months Vitals Screenings to include cognitive, depression, and falls Referrals and appointments  In addition, I have reviewed and discussed with patient certain preventive protocols, quality metrics, and best practice recommendations. A written personalized care plan for preventive services as well as general preventive health recommendations were provided to patient.     Lorrene Reid, LPN   06/28/5283   After Visit  Summary: (MyChart) Due to this being a telephonic visit, the after visit summary with patients personalized plan was offered to patient via MyChart   Nurse Notes: none

## 2023-09-18 NOTE — Patient Instructions (Signed)
[name] [name] , Thank you for taking time to come for your Medicare Wellness Visit. I appreciate your ongoing commitment to your health goals. Please review the following plan we discussed and let me know if I can assist you in the future.   Referrals/Orders/Follow-Ups/Clinician Recommendations: Aim for 30 minutes of exercise or brisk walking, 6-8 glasses of water, and 5 servings of fruits and vegetables each day.   This is a list of the screening recommended for you and due dates:  Health Maintenance  Topic Date Due   COVID-19 Vaccine (3 - Moderna risk series) 01/23/2021   Mammogram  06/21/2022   Flu Shot  07/31/2023   Hepatitis C Screening  08/25/2024*   Zoster (Shingles) Vaccine (2 of 2) 10/21/2023   Cologuard (Stool DNA test)  05/23/2024   Medicare Annual Wellness Visit  09/17/2024   Pap with HPV screening  10/09/2026   DTaP/Tdap/Td vaccine (3 - Td or Tdap) 08/25/2033   HIV Screening  Completed   HPV Vaccine  Aged Out  *Topic was postponed. The date shown is not the original due date.    Advanced directives: (In Chart) A copy of your advanced directives are scanned into your chart should your provider ever need it.  Next Medicare Annual Wellness Visit scheduled for next year: Yes insert Preventive Care Attachment Reference

## 2023-09-19 ENCOUNTER — Encounter: Payer: Self-pay | Admitting: Family Medicine

## 2023-09-19 ENCOUNTER — Other Ambulatory Visit: Payer: Self-pay | Admitting: Cardiology

## 2023-09-19 DIAGNOSIS — K219 Gastro-esophageal reflux disease without esophagitis: Secondary | ICD-10-CM

## 2023-09-19 DIAGNOSIS — R110 Nausea: Secondary | ICD-10-CM

## 2023-09-19 DIAGNOSIS — R140 Abdominal distension (gaseous): Secondary | ICD-10-CM

## 2023-09-22 ENCOUNTER — Other Ambulatory Visit: Payer: Self-pay

## 2023-09-22 ENCOUNTER — Encounter (INDEPENDENT_AMBULATORY_CARE_PROVIDER_SITE_OTHER): Payer: Self-pay | Admitting: *Deleted

## 2023-09-22 MED ORDER — SPIRONOLACTONE 25 MG PO TABS: 25.0000 mg | ORAL_TABLET | Freq: Every day | ORAL | 3 refills | Status: DC

## 2023-09-26 NOTE — Progress Notes (Signed)
Remote ICD transmission.   

## 2023-09-30 ENCOUNTER — Ambulatory Visit (INDEPENDENT_AMBULATORY_CARE_PROVIDER_SITE_OTHER): Payer: PPO | Admitting: Gastroenterology

## 2023-10-06 ENCOUNTER — Ambulatory Visit
Admission: RE | Admit: 2023-10-06 | Discharge: 2023-10-06 | Disposition: A | Discharge status: Home / Self Care | Payer: PPO | Source: Ambulatory Visit | Attending: Family | Admitting: Family

## 2023-10-06 DIAGNOSIS — Z1231 Encounter for screening mammogram for malignant neoplasm of breast: Secondary | ICD-10-CM | POA: Diagnosis not present

## 2023-10-08 NOTE — Progress Notes (Signed)
Normal mammogram. Repeat screening in one year.

## 2023-10-09 ENCOUNTER — Other Ambulatory Visit (HOSPITAL_COMMUNITY): Payer: Self-pay | Admitting: Cardiology

## 2023-10-09 ENCOUNTER — Other Ambulatory Visit: Payer: Self-pay | Admitting: Cardiology

## 2023-10-09 ENCOUNTER — Other Ambulatory Visit: Payer: Self-pay | Admitting: Internal Medicine

## 2023-10-09 DIAGNOSIS — I5042 Chronic combined systolic (congestive) and diastolic (congestive) heart failure: Secondary | ICD-10-CM

## 2023-10-09 NOTE — Telephone Encounter (Signed)
Refill request for warfarin:  Last INR was 2.4 on 09/08/23 Next INR due 10/20/23 LOV was 09/11/23  Refill approved.

## 2023-10-17 DIAGNOSIS — Z79899 Other long term (current) drug therapy: Secondary | ICD-10-CM | POA: Diagnosis not present

## 2023-10-17 DIAGNOSIS — W19XXXA Unspecified fall, initial encounter: Secondary | ICD-10-CM | POA: Diagnosis not present

## 2023-10-17 DIAGNOSIS — M1711 Unilateral primary osteoarthritis, right knee: Secondary | ICD-10-CM | POA: Diagnosis not present

## 2023-10-17 DIAGNOSIS — M549 Dorsalgia, unspecified: Secondary | ICD-10-CM | POA: Diagnosis not present

## 2023-10-17 DIAGNOSIS — F411 Generalized anxiety disorder: Secondary | ICD-10-CM | POA: Diagnosis not present

## 2023-10-17 DIAGNOSIS — Z6835 Body mass index (BMI) 35.0-35.9, adult: Secondary | ICD-10-CM | POA: Diagnosis not present

## 2023-10-17 DIAGNOSIS — E6609 Other obesity due to excess calories: Secondary | ICD-10-CM | POA: Diagnosis not present

## 2023-10-17 DIAGNOSIS — G8929 Other chronic pain: Secondary | ICD-10-CM | POA: Diagnosis not present

## 2023-10-17 DIAGNOSIS — R5383 Other fatigue: Secondary | ICD-10-CM | POA: Diagnosis not present

## 2023-10-17 DIAGNOSIS — M47816 Spondylosis without myelopathy or radiculopathy, lumbar region: Secondary | ICD-10-CM | POA: Diagnosis not present

## 2023-10-20 ENCOUNTER — Ambulatory Visit: Payer: PPO | Admitting: Nurse Practitioner

## 2023-10-20 ENCOUNTER — Ambulatory Visit: Payer: PPO

## 2023-10-22 ENCOUNTER — Encounter: Payer: Self-pay | Admitting: Family Medicine

## 2023-10-28 ENCOUNTER — Ambulatory Visit: Payer: PPO | Attending: Nurse Practitioner | Admitting: Nurse Practitioner

## 2023-10-28 NOTE — Progress Notes (Deleted)
Cardiology Office Note:  .   Date:  09/11/2023 ID:  [name], DOB 1961-08-23, MRN [id number] PCP: Junie Spencer, FNP  [facility] Providers Cardiologist:  Nona Dell, MD Electrophysiologist:  Lewayne Bunting, MD    History of Present Illness: .   [name] [name] is a 62 y.o. female with a PMH of HFrEF, ischemic cardiopathy, CAD, persistent A-fib, s/p TEE/cardioversion, carotid artery disease, antiphospholipid antibody syndrome with history of prior stroke, history of recurrent VTE, s/p VT ablation and [employer] ICD in place, history of mitral valve regurgitation,  who presents today for  swelling and weight gain evaluation.  She is closely followed at the heart failure clinic as well as followed by Dr. Ladona Ridgel with EP.  Last seen by Dr. Diona Browner on June 30, 2023.  At that time, she was doing well from a cardiac perspective.  Seen by me on July 25, 2023.  Patient noticed weight fluctuating, had been taking extra Torsemide d/t this. Also noticed more shortness of breath that she attributed to the summer humidity.  Stated the last several nights she has waking up and taking nitroglycerin, chest pain waking her up from her sleep. When describing CP, said along her upper back, and doesn't describe it as a tearing sensation or painful, described as a fullness and a pressure along her back, wondered it was related to gas/indigestion as burping/belching helped.  She says she has also experienced radiation to her neck, however past MI she experienced back pain. NTG helped her symptoms. Denied any palpitations, syncope, presyncope, dizziness, orthopnea, PND, swelling acute bleeding, or claudication.  Labs obtained and came to shared medical decision with patient to increase Imdur to 30 mg twice daily.  Today she presents for follow-up.  She is recently dealing with sinus issues but doing better from when I last saw her.  Continues to note belching, feeling bloated, and which she believes  are gas symptoms.  She had 1 episode of chest pain that woke her up prior to increasing her Imdur, says after she made the medication change as last prescribed at last office visit, she has not had any more symptoms.  She does have some GI symptoms (belching), says she had this with her previous MI, and wonders if this is related to her heart. Denies any shortness of breath, palpitations, syncope, presyncope, dizziness, orthopnea, PND, significant weight changes, acute bleeding, or claudication.  Overall her weight log shows recent stable readings.  Does admit to some swelling along her right lower extremity, has some arthritis in her right knee.   Studies Reviewed: .    Echo 06/2023:  1. Left ventricular ejection fraction, by estimation, is 30 to 35%. The  left ventricle has moderately decreased function. The left ventricle  demonstrates regional wall motion abnormalities (see scoring  diagram/findings for description). The left  ventricular internal cavity size was moderately dilated. Left ventricular  diastolic parameters are consistent with Grade I diastolic dysfunction  (impaired relaxation).   2. Right ventricular systolic function is normal. The right ventricular  size is normal. There is normal pulmonary artery systolic pressure.   3. Left atrial size was mild to moderately dilated.   4. The mitral valve is normal in structure. Mild mitral valve  regurgitation. No evidence of mitral stenosis.   5. The aortic valve has an indeterminant number of cusps. Aortic valve  regurgitation is not visualized. No aortic stenosis is present.   6. The inferior vena cava is normal in size  with greater than 50%  respiratory variability, suggesting right atrial pressure of 3 mmHg.   Comparison(s): No significant change from prior study. Prior images  reviewed side by side.  Carotid doppler 06/2023: Summary:  Right Carotid: Evidence consistent with a total occlusion of the right  ICA.   Left  Carotid: Velocities in the left ICA are consistent with a 1-39%  stenosis.   Vertebrals: Bilateral vertebral arteries demonstrate antegrade flow.  Subclavians: Normal flow hemodynamics were seen in bilateral subclavian arteries.   Right/Left heart cath 02/2021: 1. Low filling pressures.  2. Preserved cardiac output.  3. Stable coronaries, patent LCx stent.  Known chronic occlusion of D1.  Physical Exam:   VS:  There were no vitals taken for this visit.   Wt Readings from Last 3 Encounters:  09/18/23 212 lb (96.2 kg)  09/11/23 213 lb 12.8 oz (97 kg)  08/26/23 214 lb (97.1 kg)    GEN: Obese, 63 y.o. female in no acute distress NECK: No JVD; No carotid bruits CARDIAC: S1/S2, RRR, no murmurs, rubs, gallops RESPIRATORY:  Clear to auscultation without rales, wheezing or rhonchi  ABDOMEN: Soft, non-tender, non-distended EXTREMITIES:  Nonpitting edema to BLE (RLE>LLE); No deformity   ASSESSMENT AND PLAN: .    CAD R/LHC in 2022 revealed stable coronary arteries, patent left circumflex stent with known chronic occlusion of D1.  Recent improvement in symptoms after medical management after last OV.  Discussed options regarding medical management, ischemic evaluation, or cardiac catheterization.  We came to the shared medical decision to increase AM dose to Imdur to 45 mg, continue Imdur 30 mg in PM.  Continue carvedilol, Zetia, pravastatin, and NTG PRN. Heart healthy diet and regular cardiovascular exercise encouraged. ED precautions discussed.  2. HFrEF, ICM, leg edema, medication management Stage C, NYHA class II-III symptoms. TTE 06/2023 revealed EF 30-35%. Recent weights stable, leg edema remains stable (chronic RLE > LLE), most likely d/t arthritis. Continue Torsemide and potassium supplement, Jardiance, Aldactone, Entresto, Verquvo, and Coreg. Increasing Imdur as mentioned above. Recommended leg elevation and compression stockings. Low sodium diet, fluid restriction <2L, and daily weights  encouraged. Educated to contact our office for weight gain of 2 lbs overnight or 5 lbs in one week.   3. Persistent A-fib Hx of TEE/DCCV 11/2020.  Denies any tachycardia or palpitations.  Continue amiodarone and carvedilol.  Heart rate is well-controlled today.  She is tolerating warfarin well.  Continue to follow-up at the Coumadin clinic as scheduled.   4. 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 This appears to be stable from previous study. Continue pravastatin and Zetia. Not on Aspirin d/t being on Warfarin. Hx of antiphospholipid antibody syndrome. Denies any symptoms. Continue to follow with PCP.  5. Hx of recurrent VT, s/p VT ablation and ICD in place Denies any symptoms or events.  Continue mexiletine and amiodarone.  Continue to follow-up with Dr. Ladona Ridgel. Recent LFT's normal. Will request most recent labs at next OV from PCP's office.  6. Mitral valve regurgitation Mild mitral valve regurgitation seen TTE 06/2023. Plan to update TTE in 3-5 years or sooner if clinically indicated.    7. Obesity Weight loss via diet and exercise encouraged. Discussed the impact being overweight would have on cardiovascular risk.  8. Belching, GERD Continue pantoprazole. Recommended to follow-up with PCP regarding this, may benefit from a GI referral for her symptoms.  She verbalized understanding.   Dispo: Follow-up with me or APP in 6 weeks or sooner if anything changes.  Signed, Sharlene Dory, NP

## 2023-11-09 ENCOUNTER — Other Ambulatory Visit (HOSPITAL_COMMUNITY): Payer: Self-pay | Admitting: Cardiology

## 2023-11-09 ENCOUNTER — Other Ambulatory Visit: Payer: Self-pay | Admitting: Internal Medicine

## 2023-11-12 ENCOUNTER — Ambulatory Visit: Payer: PPO | Attending: Cardiology | Admitting: *Deleted

## 2023-11-12 DIAGNOSIS — Z5181 Encounter for therapeutic drug level monitoring: Secondary | ICD-10-CM

## 2023-11-12 DIAGNOSIS — D6859 Other primary thrombophilia: Secondary | ICD-10-CM | POA: Diagnosis not present

## 2023-11-12 DIAGNOSIS — I4819 Other persistent atrial fibrillation: Secondary | ICD-10-CM

## 2023-11-12 LAB — POCT INR: INR: 2.7 (ref 2.0–3.0)

## 2023-11-12 NOTE — Patient Instructions (Signed)
Continue warfarin 1/2 tablet daily except 1 tablet on Mondays, Wednesdays and Fridays. Recheck 5 weeks

## 2023-11-18 DIAGNOSIS — M47816 Spondylosis without myelopathy or radiculopathy, lumbar region: Secondary | ICD-10-CM | POA: Diagnosis not present

## 2023-11-18 DIAGNOSIS — I255 Ischemic cardiomyopathy: Secondary | ICD-10-CM | POA: Diagnosis not present

## 2023-11-18 DIAGNOSIS — M1711 Unilateral primary osteoarthritis, right knee: Secondary | ICD-10-CM | POA: Diagnosis not present

## 2023-11-18 DIAGNOSIS — Z79899 Other long term (current) drug therapy: Secondary | ICD-10-CM | POA: Diagnosis not present

## 2023-11-18 DIAGNOSIS — I5022 Chronic systolic (congestive) heart failure: Secondary | ICD-10-CM | POA: Diagnosis not present

## 2023-11-18 DIAGNOSIS — R030 Elevated blood-pressure reading, without diagnosis of hypertension: Secondary | ICD-10-CM | POA: Diagnosis not present

## 2023-11-18 DIAGNOSIS — E6609 Other obesity due to excess calories: Secondary | ICD-10-CM | POA: Diagnosis not present

## 2023-11-18 DIAGNOSIS — M170 Bilateral primary osteoarthritis of knee: Secondary | ICD-10-CM | POA: Diagnosis not present

## 2023-11-18 DIAGNOSIS — Z6837 Body mass index (BMI) 37.0-37.9, adult: Secondary | ICD-10-CM | POA: Diagnosis not present

## 2023-11-18 DIAGNOSIS — Z0130 Encounter for examination of blood pressure without abnormal findings: Secondary | ICD-10-CM | POA: Diagnosis not present

## 2023-11-28 ENCOUNTER — Ambulatory Visit: Payer: Medicare PPO

## 2023-12-02 ENCOUNTER — Encounter: Payer: Self-pay | Admitting: Family Medicine

## 2023-12-02 ENCOUNTER — Encounter: Payer: Self-pay | Admitting: Internal Medicine

## 2023-12-02 NOTE — Telephone Encounter (Signed)
BSX Dual chamber ICD for cardiomyopathy.   Patient stopped abruptly her Cymbalta approximately 1 week ago.  She has been experiencing palpitations and not feeling well since with elevated BP's.  Device interrogation reveals corresponding episodes of VT and fast Vrate  (some atrial driven) events with time period of stopping medication, may be related.    Patient instructed to call PCP and pain clinic to inform them of HR/BP and ask them about "stepping down" the medication if appropriate rather than stop cold [place].    Patient would like to be seen this week in EP to evaluate and determine if anything else is needed with recent uptick in events and symptoms. Appt made with Francis Dowse PA-C for this Friday, 12/ 6/24 at 2:45pm. Patient is reaching out to both PCP and pain docs tomorrow.   (She is also past due for annual follow up with Dr. Ladona Ridgel).   HR's have increased per trends since the end of Nov 2 NSVT events, V-139 & V140, 8 & 9 beats Other NSVT events atrial driven 1:1, HR's 130's, longest duration 23sec

## 2023-12-03 ENCOUNTER — Encounter: Payer: Self-pay | Admitting: Cardiology

## 2023-12-03 MED ORDER — DULOXETINE HCL 60 MG PO CPEP: 60.0000 mg | ORAL_CAPSULE | Freq: Every day | ORAL | 0 refills | Status: DC

## 2023-12-03 NOTE — Telephone Encounter (Signed)
Will send in one month supply of duloxetine to bridge until patient can return to pain management per supervising provider, Dettinger, MD.

## 2023-12-05 ENCOUNTER — Encounter: Payer: Self-pay | Admitting: Internal Medicine

## 2023-12-05 ENCOUNTER — Ambulatory Visit: Payer: PPO | Attending: Physician Assistant | Admitting: Physician Assistant

## 2023-12-05 ENCOUNTER — Other Ambulatory Visit: Payer: Self-pay

## 2023-12-05 VITALS — BP 118/68 | HR 102 | Ht 66.5 in | Wt 221.0 lb

## 2023-12-05 DIAGNOSIS — I480 Paroxysmal atrial fibrillation: Secondary | ICD-10-CM | POA: Diagnosis not present

## 2023-12-05 DIAGNOSIS — I255 Ischemic cardiomyopathy: Secondary | ICD-10-CM | POA: Diagnosis not present

## 2023-12-05 DIAGNOSIS — I472 Ventricular tachycardia, unspecified: Secondary | ICD-10-CM | POA: Diagnosis not present

## 2023-12-05 DIAGNOSIS — Z95810 Presence of automatic (implantable) cardiac defibrillator: Secondary | ICD-10-CM | POA: Diagnosis not present

## 2023-12-05 DIAGNOSIS — I25119 Atherosclerotic heart disease of native coronary artery with unspecified angina pectoris: Secondary | ICD-10-CM

## 2023-12-05 DIAGNOSIS — I5020 Unspecified systolic (congestive) heart failure: Secondary | ICD-10-CM | POA: Diagnosis not present

## 2023-12-05 DIAGNOSIS — I5022 Chronic systolic (congestive) heart failure: Secondary | ICD-10-CM | POA: Diagnosis not present

## 2023-12-05 LAB — CUP PACEART INCLINIC DEVICE CHECK
Date Time Interrogation Session: 20241206164325
HighPow Impedance: 50 Ohm
HighPow Impedance: 56 Ohm
Implantable Lead Connection Status: 753985
Implantable Lead Connection Status: 753985
Implantable Lead Implant Date: 20081119
Implantable Lead Implant Date: 20081119
Implantable Lead Location: 753859
Implantable Lead Location: 753860
Implantable Lead Model: 185
Implantable Lead Model: 4136
Implantable Lead Serial Number: 211124
Implantable Lead Serial Number: 28383796
Implantable Pulse Generator Implant Date: 20170303
Lead Channel Impedance Value: 552 Ohm
Lead Channel Impedance Value: 616 Ohm
Lead Channel Pacing Threshold Amplitude: 0.6 V
Lead Channel Pacing Threshold Amplitude: 1 V
Lead Channel Pacing Threshold Pulse Width: 0.5 ms
Lead Channel Pacing Threshold Pulse Width: 0.5 ms
Lead Channel Sensing Intrinsic Amplitude: 19.4 mV
Lead Channel Sensing Intrinsic Amplitude: 3.2 mV
Lead Channel Setting Pacing Amplitude: 2 V
Lead Channel Setting Pacing Amplitude: 2.4 V
Lead Channel Setting Pacing Pulse Width: 0.5 ms
Lead Channel Setting Sensing Sensitivity: 0.6 mV
Pulse Gen Serial Number: 204398

## 2023-12-05 NOTE — Progress Notes (Signed)
Cardiology Office Note Date:  12/05/2023  Patient ID:  [name], [name] 1961/10/30, MRN [id number] PCP:  Junie Spencer, FNP  Cardiologist:  Dr. Diona Browner Electrophysiologist: Dr. Ladona Ridgel    Chief Complaint:   annual EP visit/NSVTs  History of Present Illness: [name] [name] is a 62 y.o. 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 Ladona Ridgel 10/01/22, he discussed hx of VT >> VT ablation >> continued to have VT w/successful ATP tx, and a slower VT 120bpm, without syncope/symptom.  Class 2 symptoms reported She had a single symptomatic episode below her VT detection which stopped spontaneously. Reduced her dose of amio to 400 mg on Sat/Sun and 300 daily Mon-Fri. . Increased her mexitil to 300 mg 3 times a day   Following with Dr. McDowell/team regularly, last with cards APP 09/11/23, discussed some bloating/belching, CP (?) better after increased Imdur dose Imdur further titrated, volume stable   12/02/23 pt called with elevated HRs, transmission noted, NSVts, and events in VT1 zone that are 1:1 by the one charted tracing. Mentioned off her duloxetine and her PMD wouldn't refill Advised if feeling poorly she should be checked, though return message was that she "Got meds" with hopes it would help  TODAY She ran out of her duloxetine about 2 weeks or so ago, subsequently started to niote when checking her BP her HRs were in the 90's unusually high for her, and in the last week or so, feeling her heart fast, like her ICD was pacing her Anxious, tremulous, a bit unsteady No CP, no near syncope or syncope Bloated but not SOB and feels if anything she is dehydrated, dry mouth. Drinks mostly soda. Reports very good about her meds, never missed and was trying to get her pharmacy/pain/PMD clinic to give her refills but  took weeks to get settled, finally 3 days ago got her medicine, and she says within hours could tell she was starting to feel better  Device information: BSCi dual chamber ICD, implanted 11/18/07, gen change 03/01/16  Hx of HIGH DFTs 03/14/2021: Conclusion: Successful defibrillation threshold testing in a patient with previously elevated defibrillation threshold with successful 21 J shock, terminated ventricular flutter and restoring sinus rhythm. The patient's device is programmed at 66 J for optimal safety margin.   + hx of ICD therapy,  Oct 2017 (apparebtly occurred with the passing of her father, I am unable to find details) March 2022 VT storm >> ablation June 2022 appropriate tx for VT and VF  Arrhythmia/AAD hx  VT VT ablation 03/08/21 (Dr. Lalla Brothers) Amiodarone started 2021 Mexiletine started 2022   AFib DCCV 2021   Past Medical History:  Diagnosis Date   Aneurysm of internal carotid artery    Anti-phospholipid syndrome (HCC)    Anticardiolipin antibody positive    Chronic Coumadin   Brain aneurysm    followed by Dr. Corliss Skains   Chronic combined systolic (congestive) and diastolic (congestive) heart failure (HCC)    Cocaine abuse in remission [facility])    Coronary atherosclerosis of native coronary artery    Previous invasive cardiac testing was done at a facility in [place], [place].  Occluded diagonal, Diffuse LAD disease, Occluded Cx, and non obstructive RCA.    Essential hypertension    Headache    History of pneumonia    ICD (implantable cardioverter-defibrillator) in  place    Ischemic cardiomyopathy    LVEF 30-35%   Mixed hyperlipidemia    NSVT (nonsustained ventricular tachycardia) (HCC)    Persistent atrial fibrillation (HCC)    PVD (peripheral vascular disease) (HCC)    Raynaud's phenomenon    Statin intolerance    Stroke [facility]) 2007   Total occlusion of the right internal carotid    Past Surgical History:  Procedure Laterality Date   ABDOMINAL AORTAGRAM  N/A 05/25/2014   Procedure: ABDOMINAL Ronny Flurry;  Surgeon: Iran Ouch, MD;  Location: [facility] CATH LAB;  Service: Cardiovascular;  Laterality: N/A;   APPENDECTOMY     BACK SURGERY  2010   Dr. Dutch Quint L1 cage, 5 disc fusion   CARDIAC DEFIBRILLATOR PLACEMENT  2008   [employer] ICD   CARDIOVERSION N/A 12/01/2020   Procedure: CARDIOVERSION;  Surgeon: [name], MD;  Location: [facility] ENDOSCOPY;  Service: Cardiovascular;  Laterality: N/A;   CORONARY STENT INTERVENTION N/A 01/08/2021   Procedure: CORONARY STENT INTERVENTION;  Surgeon: Swazil[place], Peter M, MD;  Location: [facility] INVASIVE CV LAB;  Service: Cardiovascular;  Laterality: N/A;   EP IMPLANTABLE DEVICE N/A 03/01/2016   Procedure: ICD Generator Changeout;  Surgeon: [name], MD;  Location: [facility] INVASIVE CV LAB;  Service: Cardiovascular;  Laterality: N/A;   ICD/BIV ICD FUNCTION(DFT) TEST N/A 03/14/2021   Procedure: ICD/BIV ICD FUNCTION (DFT) TEST;  Surgeon: [name], MD;  Location: [facility] INVASIVE CV LAB;  Service: Cardiovascular;  Laterality: N/A;   IR ANGIO INTRA EXTRACRAN SEL COM CAROTID INNOMINATE BILAT MOD SED  05/29/2023   IR ANGIO VERTEBRAL SEL SUBCLAVIAN INNOMINATE BILAT MOD SED  05/29/2023   IR RADIOLOGIST EVAL & MGMT  04/16/2023   IR US GUIDE VASC ACCESS RIGHT  05/29/2023   L1 corpectomy   11/29/2009   Laryngeal polyp excision     RIGHT/LEFT HEART CATH AND CORONARY ANGIOGRAPHY N/A 12/18/2020   Procedure: RIGHT/LEFT HEART CATH AND CORONARY ANGIOGRAPHY;  Surgeon: Dolores Patty, MD;  Location: [facility] INVASIVE CV LAB;  Service: Cardiovascular;  Laterality: N/A;   RIGHT/LEFT HEART CATH AND CORONARY ANGIOGRAPHY N/A 03/05/2021   Procedure: RIGHT/LEFT HEART CATH AND CORONARY ANGIOGRAPHY;  Surgeon: [name], MD;  Location: [facility] INVASIVE CV LAB;  Service: Cardiovascular;  Laterality: N/A;   TEE WITHOUT CARDIOVERSION N/A 12/01/2020   Procedure: TRANSESOPHAGEAL ECHOCARDIOGRAM (TEE);  Surgeon: [name], MD;  Location: [facility] ENDOSCOPY;   Service: Cardiovascular;  Laterality: N/AFloyce [name] ABLATION N/A 03/08/2021   Procedure: [name] [name] ABLATION;  Surgeon: Lanier Prude, MD;  Location: [facility] INVASIVE CV LAB;  Service: Cardiovascular;  Laterality: N/A;    Current Outpatient Medications  Medication Sig Dispense Refill   amiodarone (PACERONE) 200 MG tablet TAKE 1 AND 1/2 TABLETS BY MOUTH MONDAY-THURSDAY. TAKE 2 TABLETS BY MOUTH FRIDAY-SUNDAY 144 tablet 3   carvedilol (COREG) 12.5 MG tablet Take 1 tablet (12.5 mg total) by mouth 2 (two) times daily with a meal. NEEDS FOLLOW UP APPOINTMENT FOR MORE REFILLS 30 tablet 4   cetirizine (ZYRTEC ALLERGY) 10 MG tablet Take 1 tablet (10 mg total) by mouth daily. 90 tablet 1   DULoxetine (CYMBALTA) 60 MG capsule Take 1 capsule (60 mg total) by mouth daily. 30 capsule 0   empagliflozin (JARDIANCE) 10 MG TABS tablet Take 1 tablet (10 mg total) by mouth daily before breakfast. 90 tablet 3   EPINEPHRINE 0.3 mg/0.3 mL IJ SOAJ injection USE AS DIRECTED 2 each 2   ezetimibe (ZETIA) 10 MG tablet  TAKE 1 TABLET BY MOUTH EVERY DAY 30 tablet 11   fluticasone (FLONASE) 50 MCG/ACT nasal spray INSTILL 2 SPRAYS IN EACH NOSTRIL EVERY DAY 16 g 6   isosorbide mononitrate (IMDUR) 30 MG 24 hr tablet Take 1.5 tablets (45 Mg total) in the morning, and take 1 tablet (30 Mg) total in the evening 225 tablet 2   mexiletine (MEXITIL) 150 MG capsule TAKE TWO CAPSULES BY MOUTH THREE TIMES DAILY 540 capsule 3   Multiple Vitamins-Minerals (MULTIVITAMINS THER. W/MINERALS) TABS Take 1 tablet by mouth daily.       nitroGLYCERIN (NITROSTAT) 0.4 MG SL tablet PLACE 1 TABLET UNDER THE TONGUE EVERY 5 MINUTES FOR 3 DOSES AS NEEDED CHEST PAIN 25 tablet 3   oxyCODONE-acetaminophen (PERCOCET) 10-325 MG tablet Take 1 tablet by mouth every 4 (four) hours as needed for pain.     pantoprazole (PROTONIX) 40 MG tablet TAKE 1 TABLET BY MOUTH DAILY 30 tablet 11   potassium chloride SA (KLOR-CON M) 20 MEQ tablet TAKE 2 TABLETS BY MOUTH EVERY  MORNING and TAKE 1 TABLET EVERY EVENING 270 tablet 1   pravastatin (PRAVACHOL) 20 MG tablet Take 1 tablet (20 mg total) by mouth every evening. (Patient taking differently: Take 20 mg by mouth as directed. 4 x per week) 30 tablet 11   promethazine (PHENERGAN) 25 MG tablet Take 25 mg by mouth every 8 (eight) hours as needed.     sacubitril-valsartan (ENTRESTO) 24-26 MG Take 1 tablet by mouth 2 (two) times daily. 180 tablet 3   sennosides-docusate sodium (SENOKOT-S) 8.6-50 MG tablet Take 1 tablet by mouth daily as needed.     spironolactone (ALDACTONE) 25 MG tablet Take 1 tablet (25 mg total) by mouth daily. 30 tablet 3   torsemide (DEMADEX) 20 MG tablet Take 1 tablet (20 mg total) by mouth 4 (four) times daily for 2 days. May take an additional 20 mg tablet daily for swelling (Patient taking differently: Takes 2 tabs every morning, 1 at lunch, & 1 at 7 pm) 10 tablet 0   traZODone (DESYREL) 100 MG tablet Take 100 mg by mouth at bedtime as needed for sleep.     Vericiguat (VERQUVO) 5 MG TABS Take 1 tablet (5 mg total) by mouth daily. 30 tablet 5   warfarin (COUMADIN) 5 MG tablet TAKE ONE-HALF TO 1 TABLET BY MOUTH EVERY DAY OR USE AS DIRECTED by coumadin clinic 30 tablet 5   No current facility-administered medications for this visit.    Allergies:   Beef-derived drug products, Metoclopramide hcl, Penicillins, Pork-derived products, Metoprolol, and Statins   Social History:  The patient  reports that she quit smoking about 2 years ago. Her smoking use included cigarettes and e-cigarettes. She started smoking about 45 years ago. She has a 10.8 pack-year smoking history. She uses smokeless tobacco. She reports that she does not drink alcohol and does not use drugs.   Family History:  The patient's family history includes Ankylosing spondylitis in her sister; Heart attack in her brother; Heart disease in her sister; Heart disease (age of onset: 76) in her father; Hyperlipidemia in her father; Hypertension  in her father and sister; Stomach cancer (age of onset: 49) in her brother; Uterine cancer in her mother.  ROS:  Please see the history of present illness.   All other systems are reviewed and otherwise negative.   PHYSICAL EXAM:  VS:  There were no vitals taken for this visit. BMI: There is no height or weight on file to calculate  BMI. Well nourished, well developed, in no acute distress  HEENT: normocephalic, atraumatic  Neck: no JVD, carotid bruits or masses Cardiac: RRR; no significant murmurs, no rubs, or gallops Lungs: CTA b/l, no wheezing, rhonchi or rales  Abd: soft, nontender MS: no deformity or atrophy Ext: no edema  Skin: warm and dry, no rash Neuro:  No gross deficits appreciated Psych: euthymic mood, full affect  ICD site is stable, no tethering or discomfort   EKG:  done today and reviewed by myself ST 103bpm   ICD interrogation done today and reviewed by myself:  Battery and lead measurements are good NSVT/VT 1 episodes are all 1:1 except one slow VT 150bpm, 44 seconds (zone one) No therapies delivered   Echo 06/2023:  1. Left ventricular ejection fraction, by estimation, is 30 to 35%. The  left ventricle has moderately decreased function. The left ventricle  demonstrates regional wall motion abnormalities (see scoring  diagram/findings for description). The left  ventricular internal cavity size was moderately dilated. Left ventricular  diastolic parameters are consistent with Grade I diastolic dysfunction  (impaired relaxation).   2. Right ventricular systolic function is normal. The right ventricular  size is normal. There is normal pulmonary artery systolic pressure.   3. Left atrial size was mild to moderately dilated.   4. The mitral valve is normal in structure. Mild mitral valve  regurgitation. No evidence of mitral stenosis.   5. The aortic valve has an indeterminant number of cusps. Aortic valve  regurgitation is not visualized. No aortic stenosis  is present.   6. The inferior vena cava is normal in size with greater than 50%  respiratory variability, suggesting right atrial pressure of 3 mmHg.   Comparison(s): No significant change from prior study. Prior images  reviewed side by side.  3/10/'22: EPS/ablation CONCLUSIONS: 1. Sinus rhythm upon presentation  2. Inducible ventricular tachycardia. (multiple morphologies, most originating from the apical lateral region of the LV. 2 dominant morphologies were negative throughout the precordium, QS in lead I and negative/positive in the inferior leads).  3. Successful scar homogenization of the anterolateral/lateral scar extending from the apex to the mitral valve annulus.  4. A large anterior/anterolateral/lateral LV scar detailed with voltage mapping c/w prior coronary artery disease history 5. No early apparent complications. 6. Plan to restart heparin after 6 hours bedrest. Coumadin to restart today. 7. Plan to assess ICD with DFT testing early next week with possible system revision. 8. Continue mexiletine and amiodarone. 9. If repeat ablation required in the future, will require hemodynamic support.   Right/Left heart cath 02/2021: 1. Low filling pressures.  2. Preserved cardiac output.  3. Stable coronaries, patent LCx stent.  Known chronic occlusion of D1.  06/19/17: TTE Study Conclusions - Left ventricle: The cavity size was severely dilated. Wall   thickness was normal. Doppler parameters are consistent with   abnormal left ventricular relaxation (grade 1 diastolic   dysfunction). - Mitral valve: There was mild regurgitation. - Left atrium: The atrium was mildly dilated. LVEF is approximately 30% with severe hypokinesis / akinesis fo the inferior , distal anterior, and apical walls The cavity size was severely dilated  Recent Labs: 04/17/2023: B Natriuretic Peptide 184.9 05/29/2023: BUN 13; Creatinine, Ser 1.07; Hemoglobin 15.7; Platelets 123; Potassium 3.6; Sodium 135   No results found for requested labs within last 365 days.   CrCl cannot be calculated (Patient's most recent lab result is older than the maximum 21 days allowed.).   Wt Readings from Last  3 Encounters:  09/18/23 212 lb (96.2 kg)  09/11/23 213 lb 12.8 oz (97 kg)  08/26/23 214 lb (97.1 kg)     Other studies reviewed: Additional studies/records reviewed today include: summarized above  ASSESSMENT AND PLAN:  1. ICD     Intact function     no programming changes made  2. VT Chronically on: Amiodarone Mexiletine   Sinus tach, symptoms Suspect Cymbalta withdrawal >> back on 3 days and already feeling a bit of beneift Though will check labs today as well.   3. Paroxysmal Afib CHA2DS2Vasc is 5, on Warfarin No AFib noted   4. ICM  5. Chronic CHF, class I     On BB/entresto, diuretic, rare extra/PRN dosing     Appears euvolemic     Foot print shifted right     BNP today  6. CAD     No anginal symptoms     On BB, nitrate, statin     C/w Dr. McDowell/team   7. Hypercoagulable state Anticardiolipin antibody positive Also w/AFib On warfarin    Disposition: back in 6 weeks, sooner if needed   Current medicines are reviewed at length with the patient today.  The patient did not have any concerns regarding medicines.  Norma Fredrickson, PA-C 12/05/2023 7:28 AM     [facility] [address] [place] [address] [phone] (office)  [phone] (fax)

## 2023-12-05 NOTE — Patient Instructions (Signed)
Medication Instructions:   Your physician recommends that you continue on your current medications as directed. Please refer to the Current Medication list given to you today.   *If you need a refill on your cardiac medications before your next appointment, please call your pharmacy*   Lab Work:  CMET BNP CBC TSH AND FREE T3 AND T 4    If you have labs (blood work) drawn today and your tests are completely normal, you will receive your results only by: MyChart Message (if you have MyChart) OR A paper copy in the mail If you have any lab test that is abnormal or we need to change your treatment, we will call you to review the results.   Testing/Procedures: NONE ORDERED  TODAY     Follow-Up: At [facility], you and your health needs are our priority.  As part of our continuing mission to provide you with exceptional heart care, we have created designated Provider Care Teams.  These Care Teams include your primary Cardiologist (physician) and Advanced Practice Providers (APPs -  Physician Assistants and Nurse Practitioners) who all work together to provide you with the care you need, when you need it.  We recommend signing up for the patient portal called "MyChart".  Sign up information is provided on this After Visit Summary.  MyChart is used to connect with patients for Virtual Visits (Telemedicine).  Patients are able to view lab/test results, encounter notes, upcoming appointments, etc.  Non-urgent messages can be sent to your provider as well.   To learn more about what you can do with MyChart, go to [email or web address].    Your next appointment:   2-3  week(s)  Provider:   You may see  one of the following Advanced Practice Providers on your designated Care Team:   Francis Dowse, PA-[facility] " Otilio Saber, [place] Canary Brim, NP   Other Instructions

## 2023-12-09 ENCOUNTER — Other Ambulatory Visit (HOSPITAL_COMMUNITY): Payer: Self-pay | Admitting: Cardiology

## 2023-12-09 ENCOUNTER — Telehealth: Payer: PPO | Admitting: Family

## 2023-12-09 ENCOUNTER — Encounter: Payer: Self-pay | Admitting: Family

## 2023-12-09 DIAGNOSIS — F4310 Post-traumatic stress disorder, unspecified: Secondary | ICD-10-CM | POA: Diagnosis not present

## 2023-12-09 DIAGNOSIS — I4819 Other persistent atrial fibrillation: Secondary | ICD-10-CM

## 2023-12-09 DIAGNOSIS — I10 Essential (primary) hypertension: Secondary | ICD-10-CM | POA: Diagnosis not present

## 2023-12-09 DIAGNOSIS — F17200 Nicotine dependence, unspecified, uncomplicated: Secondary | ICD-10-CM

## 2023-12-09 DIAGNOSIS — E785 Hyperlipidemia, unspecified: Secondary | ICD-10-CM | POA: Diagnosis not present

## 2023-12-09 DIAGNOSIS — F5101 Primary insomnia: Secondary | ICD-10-CM | POA: Diagnosis not present

## 2023-12-09 DIAGNOSIS — I5042 Chronic combined systolic (congestive) and diastolic (congestive) heart failure: Secondary | ICD-10-CM | POA: Diagnosis not present

## 2023-12-09 DIAGNOSIS — G720 Drug-induced myopathy: Secondary | ICD-10-CM | POA: Diagnosis not present

## 2023-12-09 DIAGNOSIS — R002 Palpitations: Secondary | ICD-10-CM | POA: Diagnosis not present

## 2023-12-09 DIAGNOSIS — F411 Generalized anxiety disorder: Secondary | ICD-10-CM | POA: Insufficient documentation

## 2023-12-09 DIAGNOSIS — T466X5A Adverse effect of antihyperlipidemic and antiarteriosclerotic drugs, initial encounter: Secondary | ICD-10-CM

## 2023-12-09 DIAGNOSIS — Z7901 Long term (current) use of anticoagulants: Secondary | ICD-10-CM

## 2023-12-09 DIAGNOSIS — R7303 Prediabetes: Secondary | ICD-10-CM | POA: Diagnosis not present

## 2023-12-09 DIAGNOSIS — K219 Gastro-esophageal reflux disease without esophagitis: Secondary | ICD-10-CM

## 2023-12-09 MED ORDER — DULOXETINE HCL 60 MG PO CPEP: 60.0000 mg | ORAL_CAPSULE | Freq: Every day | ORAL | 0 refills | Status: DC

## 2023-12-09 MED ORDER — TRAZODONE HCL 100 MG PO TABS
100.0000 mg | ORAL_TABLET | Freq: Every evening | ORAL | 1 refills | Status: DC | PRN
Start: 2023-12-09 — End: 2024-06-26

## 2023-12-09 NOTE — Progress Notes (Signed)
Virtual Visit Consent   [name] [name], you are scheduled for a virtual visit with a [facility] provider today. Just as with appointments in the office, your consent must be obtained to participate. Your consent will be active for this visit and any virtual visit you may have with one of our providers in the next 365 days. If you have a MyChart account, a copy of this consent can be sent to you electronically.  As this is a virtual visit, video technology does not allow for your provider to perform a traditional examination. This may limit your provider's ability to fully assess your condition. If your provider identifies any concerns that need to be evaluated in person or the need to arrange testing (such as labs, EKG, etc.), we will make arrangements to do so. Although advances in technology are sophisticated, we cannot ensure that it will always work on either your end or our end. If the connection with a video visit is poor, the visit may have to be switched to a telephone visit. With either a video or telephone visit, we are not always able to ensure that we have a secure connection.  By engaging in this virtual visit, you consent to the provision of healthcare and authorize for your insurance to be billed (if applicable) for the services provided during this visit. Depending on your insurance coverage, you may receive a charge related to this service.  I need to obtain your verbal consent now. Are you willing to proceed with your visit today? [name] [name] has provided verbal consent on 12/09/2023 for a virtual visit (video or telephone). Jannifer Rodney, FNP  Date: 12/09/2023 10:01 AM  Virtual Visit via Video Note   I, Jannifer Rodney, connected with  [name] [name]  ([id number], 1961-10-06) on 12/09/23 at  9:40 AM EST by a video-enabled telemedicine application and verified that I am speaking with the correct person using two identifiers.  Location: Patient: Virtual Visit Location Patient:  Home Provider: Virtual Visit Location Provider: Home Office   I discussed the limitations of evaluation and management by telemedicine and the availability of in person appointments. The patient expressed understanding and agreed to proceed.    History of Present Illness: [name] [name] is a 62 y.o. who identifies as a female who was assigned female at birth, and is being seen today for chronic follow up.  She is currently seeing pain management for chronic back and shoulder pain every month.   She is followed by Cardiologists for CHF, A FIB, HTN. She has pacemaker and defibrillator in placed and followed by Cardiac physiologists.  She can not tolerate statins related to myopathy and takes Zetia.   HPI: Hypertension This is a chronic problem. The current episode started more than 1 year ago. The problem has been resolved since onset. The problem is controlled. Associated symptoms include anxiety, malaise/fatigue and palpitations. Pertinent negatives include no peripheral edema or shortness of breath. Risk factors for coronary artery disease include sedentary lifestyle and smoking/tobacco exposure. The current treatment provides moderate improvement. Hypertensive end-organ damage includes heart failure.  Congestive Heart Failure Presents for follow-up visit. Associated symptoms include fatigue and palpitations. Pertinent negatives include no edema or shortness of breath. The symptoms have been stable.  Gastroesophageal Reflux She complains of belching and heartburn. This is a chronic problem. The current episode started more than 1 year ago. The problem occurs rarely. Associated symptoms include fatigue. She has tried a PPI for the symptoms. The treatment provided  moderate relief.  Nicotine Dependence Presents for follow-up visit. Symptoms include fatigue and insomnia. Her urge triggers include company of smokers. The symptoms have been stable. She smokes < 1/2 a pack of cigarettes per day.   Hyperlipidemia This is a chronic problem. The current episode started more than 1 year ago. Factors aggravating her hyperlipidemia include smoking. Pertinent negatives include no shortness of breath. Current antihyperlipidemic treatment includes diet change. The current treatment provides no improvement of lipids. Risk factors for coronary artery disease include dyslipidemia, hypertension, a sedentary lifestyle and post-menopausal.  Insomnia Primary symptoms: difficulty falling asleep, frequent awakening, malaise/fatigue.   The current episode started more than one year. The onset quality is gradual. The problem occurs intermittently. Past treatments include medication. The treatment provided moderate relief.  Anxiety Presents for follow-up visit. Symptoms include excessive worry, insomnia, nervous/anxious behavior and palpitations. Patient reports no shortness of breath. Symptoms occur most days. The severity of symptoms is mild.      Problems:  Patient Active Problem List   Diagnosis Date Noted   GAD (generalized anxiety disorder) 12/09/2023   Statin myopathy 03/12/2022   PTSD (post-traumatic stress disorder) 10/10/2021   Chronic pain 07/08/2021   Syncope 06/23/2021   Postmenopausal 05/23/2021   Allergy to alpha-gal 05/23/2021   Insomnia 02/09/2021   Unstable angina (HCC) 01/11/2021   Defibrillator discharge    CAD in native artery    Persistent atrial fibrillation (HCC) 12/15/2020   Prediabetes 12/15/2020   Ventricular tachycardia (HCC) 12/15/2020   Right carotid artery occlusion    GERD (gastroesophageal reflux disease) 08/11/2020   Allergic rhinitis 08/11/2020   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   Tobacco dependence 06/18/2017   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)    Left-sided weakness    Chronic combined systolic (congestive) and diastolic (congestive) heart failure (HCC) 07/26/2015   Encounter for  therapeutic drug monitoring 02/02/2014   Dual implantable cardioverter-defibrillator in situ 06/01/2012   Long term current use of anticoagulant 03/22/2011   Essential hypertension, benign 05/29/2010   Hyperlipidemia 09/26/2009   Peripheral vascular disease (HCC) 06/23/2009   Cardiomyopathy, ischemic 11/29/2008   CEREBROVASCULAR DISEASE 11/29/2008   Anticardiolipin syndrome (HCC) 11/29/2008    Allergies:  Allergies  Allergen Reactions   Beef-Derived Drug Products Anaphylaxis    From tick bite   Metoclopramide Hcl Anaphylaxis    Non responsive   Penicillins Anaphylaxis    Shock Has patient had a PCN reaction causing immediate rash, facial/tongue/throat swelling, SOB or lightheadedness with hypotension: Yes Has patient had a PCN reaction causing severe rash involving mucus membranes or skin necrosis: No Has patient had a PCN reaction that required hospitalization Yes Has patient had a PCN reaction occurring within the last 10 years: No If all of the above answers are "NO", then may proceed with Cephalosporin    Pork-Derived Products Anaphylaxis    From tick bite. Has tolerated heparin/lovenox   Metoprolol Other (See Comments)    Syncope    Statins Other (See Comments)    Myalgias   Medications:  Current Outpatient Medications:    amiodarone (PACERONE) 200 MG tablet, TAKE 1 AND 1/2 TABLETS BY MOUTH MONDAY-THURSDAY. TAKE 2 TABLETS BY MOUTH FRIDAY-SUNDAY, Disp: 144 tablet, Rfl: 3   carvedilol (COREG) 12.5 MG tablet, Take 1 tablet (12.5 mg total) by mouth 2 (two) times daily with a meal. NEEDS FOLLOW UP APPOINTMENT FOR MORE REFILLS, Disp: 30 tablet, Rfl: 4   cetirizine (ZYRTEC ALLERGY) 10 MG tablet, Take  1 tablet (10 mg total) by mouth daily., Disp: 90 tablet, Rfl: 1   DULoxetine (CYMBALTA) 60 MG capsule, Take 1 capsule (60 mg total) by mouth daily., Disp: 30 capsule, Rfl: 0   empagliflozin (JARDIANCE) 10 MG TABS tablet, Take 1 tablet (10 mg total) by mouth daily before breakfast.,  Disp: 90 tablet, Rfl: 3   EPINEPHRINE 0.3 mg/0.3 mL IJ SOAJ injection, USE AS DIRECTED, Disp: 2 each, Rfl: 2   ezetimibe (ZETIA) 10 MG tablet, TAKE 1 TABLET BY MOUTH EVERY DAY, Disp: 30 tablet, Rfl: 11   fluticasone (FLONASE) 50 MCG/ACT nasal spray, INSTILL 2 SPRAYS IN EACH NOSTRIL EVERY DAY, Disp: 16 g, Rfl: 6   isosorbide mononitrate (IMDUR) 30 MG 24 hr tablet, Take 1.5 tablets (45 Mg total) in the morning, and take 1 tablet (30 Mg) total in the evening, Disp: 225 tablet, Rfl: 2   mexiletine (MEXITIL) 150 MG capsule, TAKE TWO CAPSULES BY MOUTH THREE TIMES DAILY, Disp: 540 capsule, Rfl: 3   Multiple Vitamins-Minerals (MULTIVITAMINS THER. W/MINERALS) TABS, Take 1 tablet by mouth daily.  , Disp: , Rfl:    nitroGLYCERIN (NITROSTAT) 0.4 MG SL tablet, PLACE 1 TABLET UNDER THE TONGUE EVERY 5 MINUTES FOR 3 DOSES AS NEEDED CHEST PAIN, Disp: 25 tablet, Rfl: 3   oxyCODONE-acetaminophen (PERCOCET) 10-325 MG tablet, Take 1 tablet by mouth every 4 (four) hours as needed for pain., Disp: , Rfl:    pantoprazole (PROTONIX) 40 MG tablet, TAKE 1 TABLET BY MOUTH DAILY, Disp: 30 tablet, Rfl: 11   potassium chloride SA (KLOR-CON M) 20 MEQ tablet, TAKE 2 TABLETS BY MOUTH EVERY MORNING and TAKE 1 TABLET EVERY EVENING, Disp: 270 tablet, Rfl: 1   pravastatin (PRAVACHOL) 20 MG tablet, Take 1 tablet (20 mg total) by mouth every evening. (Patient taking differently: Take 20 mg by mouth as directed. 3 x per week), Disp: 30 tablet, Rfl: 11   promethazine (PHENERGAN) 25 MG tablet, Take 25 mg by mouth every 8 (eight) hours as needed., Disp: , Rfl:    sacubitril-valsartan (ENTRESTO) 24-26 MG, Take 1 tablet by mouth 2 (two) times daily., Disp: 180 tablet, Rfl: 3   sennosides-docusate sodium (SENOKOT-S) 8.6-50 MG tablet, Take 1 tablet by mouth daily as needed., Disp: , Rfl:    spironolactone (ALDACTONE) 25 MG tablet, Take 1 tablet (25 mg total) by mouth daily., Disp: 30 tablet, Rfl: 3   torsemide (DEMADEX) 20 MG tablet, Take 1 tablet  (20 mg total) by mouth 4 (four) times daily for 2 days. May take an additional 20 mg tablet daily for swelling (Patient taking differently: Takes 2 tabs every morning, 1 at lunch, & 1 at 7 pm), Disp: 10 tablet, Rfl: 0   traZODone (DESYREL) 100 MG tablet, Take 1 tablet (100 mg total) by mouth at bedtime as needed for sleep., Disp: 90 tablet, Rfl: 1   Vericiguat (VERQUVO) 5 MG TABS, Take 1 tablet (5 mg total) by mouth daily., Disp: 30 tablet, Rfl: 5   warfarin (COUMADIN) 5 MG tablet, TAKE ONE-HALF TO 1 TABLET BY MOUTH EVERY DAY OR USE AS DIRECTED by coumadin clinic, Disp: 30 tablet, Rfl: 5  Observations/Objective: Patient is well-developed, well-nourished in no acute distress.  Resting comfortably  at home.  Head is normocephalic, atraumatic.  No labored breathing.  Speech is clear and coherent with logical content.  Patient is alert and oriented at baseline.    Assessment and Plan: 1. Chronic combined systolic (congestive) and diastolic (congestive) heart failure (HCC) - CMP14+EGFR - CBC  with Differential/Platelet; Future  2. Tobacco dependence - CMP14+EGFR - CBC with Differential/Platelet; Future  3. Hyperlipidemia, unspecified hyperlipidemia type - CMP14+EGFR - CBC with Differential/Platelet; Future  4. Essential hypertension, benign - CMP14+EGFR - CBC with Differential/Platelet; Future  5. Long term current use of anticoagulant - CMP14+EGFR - CBC with Differential/Platelet; Future  6. Gastroesophageal reflux disease without esophagitis - CMP14+EGFR - CBC with Differential/Platelet; Future  7. Persistent atrial fibrillation (HCC) - CMP14+EGFR - CBC with Differential/Platelet; Future  8. Prediabetes - Bayer DCA Hb A1c Waived; Future - CMP14+EGFR - CBC with Differential/Platelet; Future  9. Palpitations - CMP14+EGFR - Thyroid Panel With TSH; Future - CBC with Differential/Platelet; Future  10. GAD (generalized anxiety disorder) - CMP14+EGFR - DULoxetine  (CYMBALTA) 60 MG capsule; Take 1 capsule (60 mg total) by mouth daily.  Dispense: 30 capsule; Refill: 0  11. PTSD (post-traumatic stress disorder) - CMP14+EGFR - DULoxetine (CYMBALTA) 60 MG capsule; Take 1 capsule (60 mg total) by mouth daily.  Dispense: 30 capsule; Refill: 0  12. Statin myopathy - CMP14+EGFR  13. Primary insomnia - CMP14+EGFR - traZODone (DESYREL) 100 MG tablet; Take 1 tablet (100 mg total) by mouth at bedtime as needed for sleep.  Dispense: 90 tablet; Refill: 1  Keep follow up with specialists  Continue current medications  Continue to weigh daily and keep coumadin clinic follow up Labs pending  RTO in 6 months   Follow Up Instructions: I discussed the assessment and treatment plan with the patient. The patient was provided an opportunity to ask questions and all were answered. The patient agreed with the plan and demonstrated an understanding of the instructions.  A copy of instructions were sent to the patient via MyChart unless otherwise noted below.    The patient was advised to call back or seek an in-person evaluation if the symptoms worsen or if the condition fails to improve as anticipated.    Jannifer Rodney, FNP

## 2023-12-10 ENCOUNTER — Ambulatory Visit (INDEPENDENT_AMBULATORY_CARE_PROVIDER_SITE_OTHER): Payer: PPO

## 2023-12-10 DIAGNOSIS — I255 Ischemic cardiomyopathy: Secondary | ICD-10-CM

## 2023-12-14 LAB — CUP PACEART REMOTE DEVICE CHECK
Battery Remaining Longevity: 66 mo
Battery Remaining Percentage: 77 %
Brady Statistic RA Percent Paced: 0 %
Brady Statistic RV Percent Paced: 0 %
Date Time Interrogation Session: 20241213135300
HighPow Impedance: 51 Ohm
Implantable Lead Connection Status: 753985
Implantable Lead Connection Status: 753985
Implantable Lead Implant Date: 20081119
Implantable Lead Implant Date: 20081119
Implantable Lead Location: 753859
Implantable Lead Location: 753860
Implantable Lead Model: 185
Implantable Lead Model: 4136
Implantable Lead Serial Number: 211124
Implantable Lead Serial Number: 28383796
Implantable Pulse Generator Implant Date: 20170303
Lead Channel Impedance Value: 483 Ohm
Lead Channel Impedance Value: 554 Ohm
Lead Channel Pacing Threshold Amplitude: 0.6 V
Lead Channel Pacing Threshold Amplitude: 1 V
Lead Channel Pacing Threshold Pulse Width: 0.5 ms
Lead Channel Pacing Threshold Pulse Width: 0.5 ms
Lead Channel Setting Pacing Amplitude: 2 V
Lead Channel Setting Pacing Amplitude: 2.4 V
Lead Channel Setting Pacing Pulse Width: 0.5 ms
Lead Channel Setting Sensing Sensitivity: 0.6 mV
Pulse Gen Serial Number: 204398

## 2023-12-15 ENCOUNTER — Encounter: Payer: Self-pay | Admitting: Cardiology

## 2023-12-15 DIAGNOSIS — I255 Ischemic cardiomyopathy: Secondary | ICD-10-CM | POA: Diagnosis not present

## 2023-12-15 DIAGNOSIS — M1711 Unilateral primary osteoarthritis, right knee: Secondary | ICD-10-CM | POA: Diagnosis not present

## 2023-12-15 DIAGNOSIS — E6609 Other obesity due to excess calories: Secondary | ICD-10-CM | POA: Diagnosis not present

## 2023-12-15 DIAGNOSIS — G8929 Other chronic pain: Secondary | ICD-10-CM | POA: Diagnosis not present

## 2023-12-15 DIAGNOSIS — M47816 Spondylosis without myelopathy or radiculopathy, lumbar region: Secondary | ICD-10-CM | POA: Diagnosis not present

## 2023-12-15 DIAGNOSIS — I5022 Chronic systolic (congestive) heart failure: Secondary | ICD-10-CM | POA: Diagnosis not present

## 2023-12-15 DIAGNOSIS — Z79899 Other long term (current) drug therapy: Secondary | ICD-10-CM | POA: Diagnosis not present

## 2023-12-15 DIAGNOSIS — M549 Dorsalgia, unspecified: Secondary | ICD-10-CM | POA: Diagnosis not present

## 2023-12-15 DIAGNOSIS — M542 Cervicalgia: Secondary | ICD-10-CM | POA: Diagnosis not present

## 2023-12-15 DIAGNOSIS — M170 Bilateral primary osteoarthritis of knee: Secondary | ICD-10-CM | POA: Diagnosis not present

## 2023-12-15 DIAGNOSIS — I499 Cardiac arrhythmia, unspecified: Secondary | ICD-10-CM | POA: Diagnosis not present

## 2023-12-15 DIAGNOSIS — Z6838 Body mass index (BMI) 38.0-38.9, adult: Secondary | ICD-10-CM | POA: Diagnosis not present

## 2023-12-18 ENCOUNTER — Encounter: Payer: Self-pay | Admitting: Nurse Practitioner

## 2023-12-18 ENCOUNTER — Ambulatory Visit: Payer: PPO

## 2023-12-19 ENCOUNTER — Encounter: Payer: Self-pay | Admitting: Cardiology

## 2023-12-27 ENCOUNTER — Encounter: Payer: Self-pay | Admitting: Internal Medicine

## 2023-12-29 ENCOUNTER — Other Ambulatory Visit: Payer: PPO

## 2023-12-29 DIAGNOSIS — I4819 Other persistent atrial fibrillation: Secondary | ICD-10-CM

## 2023-12-29 DIAGNOSIS — Z7901 Long term (current) use of anticoagulants: Secondary | ICD-10-CM | POA: Diagnosis not present

## 2023-12-29 DIAGNOSIS — R002 Palpitations: Secondary | ICD-10-CM

## 2023-12-29 DIAGNOSIS — I5042 Chronic combined systolic (congestive) and diastolic (congestive) heart failure: Secondary | ICD-10-CM

## 2023-12-29 DIAGNOSIS — F17200 Nicotine dependence, unspecified, uncomplicated: Secondary | ICD-10-CM | POA: Diagnosis not present

## 2023-12-29 DIAGNOSIS — K219 Gastro-esophageal reflux disease without esophagitis: Secondary | ICD-10-CM | POA: Diagnosis not present

## 2023-12-29 DIAGNOSIS — E785 Hyperlipidemia, unspecified: Secondary | ICD-10-CM

## 2023-12-29 DIAGNOSIS — R7303 Prediabetes: Secondary | ICD-10-CM

## 2023-12-29 DIAGNOSIS — I10 Essential (primary) hypertension: Secondary | ICD-10-CM

## 2023-12-29 LAB — BAYER DCA HB A1C WAIVED: HB A1C (BAYER DCA - WAIVED): 4.6 % — ABNORMAL LOW (ref 4.8–5.6)

## 2023-12-30 LAB — CMP14+EGFR
ALT: 42 [IU]/L — ABNORMAL HIGH (ref 0–32)
AST: 37 [IU]/L (ref 0–40)
Albumin: 3.8 g/dL (ref 3.8–4.9)
Alkaline Phosphatase: 117 [IU]/L (ref 44–121)
BUN/Creatinine Ratio: 12 (ref 12–28)
BUN: 14 mg/dL (ref 8–27)
Bilirubin Total: 0.3 mg/dL (ref 0.0–1.2)
CO2: 25 mmol/L (ref 20–29)
Calcium: 8.4 mg/dL — ABNORMAL LOW (ref 8.7–10.3)
Chloride: 97 mmol/L (ref 96–106)
Creatinine, Ser: 1.2 mg/dL — ABNORMAL HIGH (ref 0.57–1.00)
Globulin, Total: 2.8 g/dL (ref 1.5–4.5)
Glucose: 87 mg/dL (ref 70–99)
Potassium: 4.4 mmol/L (ref 3.5–5.2)
Sodium: 138 mmol/L (ref 134–144)
Total Protein: 6.6 g/dL (ref 6.0–8.5)
eGFR: 52 mL/min/{1.73_m2} — ABNORMAL LOW (ref >59)

## 2023-12-30 LAB — CBC WITH DIFFERENTIAL/PLATELET
Basophils Absolute: 0.1 10*3/uL (ref 0.0–0.2)
Basos: 2 %
EOS (ABSOLUTE): 0.1 10*3/uL (ref 0.0–0.4)
Eos: 2 %
Hematocrit: 42.5 % (ref 34.0–46.6)
Hemoglobin: 13.8 g/dL (ref 11.1–15.9)
Immature Grans (Abs): 0 10*3/uL (ref 0.0–0.1)
Immature Granulocytes: 0 %
Lymphocytes Absolute: 1 10*3/uL (ref 0.7–3.1)
Lymphs: 22 %
MCH: 30.9 pg (ref 26.6–33.0)
MCHC: 32.5 g/dL (ref 31.5–35.7)
MCV: 95 fL (ref 79–97)
Monocytes Absolute: 0.5 10*3/uL (ref 0.1–0.9)
Monocytes: 11 %
Neutrophils Absolute: 3 10*3/uL (ref 1.4–7.0)
Neutrophils: 63 %
Platelets: 138 10*3/uL — ABNORMAL LOW (ref 150–450)
RBC: 4.47 x10E6/uL (ref 3.77–5.28)
RDW: 12 % (ref 11.7–15.4)
WBC: 4.7 10*3/uL (ref 3.4–10.8)

## 2023-12-30 LAB — THYROID PANEL WITH TSH
Free Thyroxine Index: 3.8 (ref 1.2–4.9)
T3 Uptake Ratio: 33 % (ref 24–39)
T4, Total: 11.6 ug/dL (ref 4.5–12.0)
TSH: 2.37 u[IU]/mL (ref 0.450–4.500)

## 2024-01-07 ENCOUNTER — Other Ambulatory Visit (HOSPITAL_COMMUNITY): Payer: Self-pay | Admitting: Cardiology

## 2024-01-07 ENCOUNTER — Other Ambulatory Visit: Payer: Self-pay | Admitting: Cardiology

## 2024-01-08 ENCOUNTER — Ambulatory Visit: Payer: PPO | Admitting: Nurse Practitioner

## 2024-01-14 DIAGNOSIS — I5022 Chronic systolic (congestive) heart failure: Secondary | ICD-10-CM | POA: Diagnosis not present

## 2024-01-14 DIAGNOSIS — E6609 Other obesity due to excess calories: Secondary | ICD-10-CM | POA: Diagnosis not present

## 2024-01-14 DIAGNOSIS — M47816 Spondylosis without myelopathy or radiculopathy, lumbar region: Secondary | ICD-10-CM | POA: Diagnosis not present

## 2024-01-14 DIAGNOSIS — Z79899 Other long term (current) drug therapy: Secondary | ICD-10-CM | POA: Diagnosis not present

## 2024-01-14 DIAGNOSIS — M1711 Unilateral primary osteoarthritis, right knee: Secondary | ICD-10-CM | POA: Diagnosis not present

## 2024-01-14 DIAGNOSIS — G894 Chronic pain syndrome: Secondary | ICD-10-CM | POA: Diagnosis not present

## 2024-01-14 DIAGNOSIS — N1831 Chronic kidney disease, stage 3a: Secondary | ICD-10-CM | POA: Diagnosis not present

## 2024-01-14 DIAGNOSIS — F411 Generalized anxiety disorder: Secondary | ICD-10-CM | POA: Diagnosis not present

## 2024-01-14 DIAGNOSIS — Z6837 Body mass index (BMI) 37.0-37.9, adult: Secondary | ICD-10-CM | POA: Diagnosis not present

## 2024-01-15 ENCOUNTER — Other Ambulatory Visit: Payer: Self-pay | Admitting: Family

## 2024-01-15 DIAGNOSIS — R110 Nausea: Secondary | ICD-10-CM

## 2024-01-15 DIAGNOSIS — F4310 Post-traumatic stress disorder, unspecified: Secondary | ICD-10-CM

## 2024-01-15 DIAGNOSIS — F411 Generalized anxiety disorder: Secondary | ICD-10-CM

## 2024-01-16 NOTE — Addendum Note (Signed)
Addended by: Elease Etienne A on: 01/16/2024 12:37 PM   Modules accepted: Orders

## 2024-01-16 NOTE — Progress Notes (Signed)
Remote ICD transmission.   

## 2024-01-19 ENCOUNTER — Telehealth: Payer: PPO | Admitting: Family

## 2024-01-25 NOTE — Progress Notes (Unsigned)
Cardiology Office Note Date:  01/25/2024  Patient ID:  [name] [name], [name] [name] [birth year], MRN [id number] PCP:  Junie Spencer, FNP  Cardiologist:  Dr. Diona Browner Electrophysiologist: Dr. Ladona Ridgel    Chief Complaint:   planned f/u  History of Present Illness: [name] [name] is a 63 y.o. 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 Ladona Ridgel 10/01/22, he discussed hx of VT >> VT ablation >> continued to have VT w/successful ATP tx, and a slower VT 120bpm, without syncope/symptom.  Class 2 symptoms reported She had a single symptomatic episode below her VT detection which stopped spontaneously. Reduced her dose of amio to 400 mg on Sat/Sun and 300 daily Mon-Fri. . Increased her mexitil to 300 mg 3 times a day   Following with Dr. McDowell/team regularly, last with cards APP 09/11/23, discussed some bloating/belching, CP (?) better after increased Imdur dose Imdur further titrated, volume stable   12/02/23 pt called with elevated HRs, transmission noted, NSVts, and events in VT1 zone that are 1:1 by the one charted tracing. Mentioned off her duloxetine and her PMD wouldn't refill Advised if feeling poorly she should be checked, though return message was that she "Got meds" with hopes it would help   I saw her 12/05/23 She ran out of her duloxetine about 2 weeks or so ago, subsequently started to niote when checking her BP her HRs were in the 90's unusually high for her, and in the last week or so, feeling her heart fast, like her ICD was pacing her Anxious, tremulous, a bit unsteady No CP, no near syncope or syncope Bloated but not SOB and feels if anything she is dehydrated, dry mouth. Drinks mostly soda. Reports very good about her meds, never missed and was trying to get her pharmacy/pain/PMD clinic to give her refills  but took weeks to get settled, finally 3 days ago got her medicine, and she says within hours could tell she was starting to feel better Device interrogation noted: NSVT/VT 1 episodes are all 1:1 except one slow VT 150bpm, 44 seconds (zone one)  ST/AT, arrhythmia suspected 2/2 cymbalta withdrawal Foot print shifted right, exam no c/w volume OL Planned for labs No changes to meds made  Since then numerous mychart discussions regarding health care assistance, swelling R knee> foot, rescheduling appts 2/2 rides/family concerns   TODAY She is doing much better She is actively working on weight loss and gaining success with that She is working on quitting smoking, hasn't smoked in weeks, using Vape infrequently (cautioned on this) She has terrible knee pain and chronic back trouble, her gait not as steady as it used to be, so unable to be very physically active No CP, palpitations No SOB at rest or with her level of exertion No near syncope or syncope. Good medication compliance   Device information: BSCi dual chamber ICD, implanted 11/18/07, gen change 03/01/16  Hx of HIGH DFTs 03/14/2021: Conclusion: Successful defibrillation threshold testing in a patient with previously elevated defibrillation threshold with successful 21 J shock, terminated ventricular flutter and restoring sinus rhythm. The patient's device is programmed at 33 J for optimal safety margin.   + hx of ICD therapy,  Oct 2017 (apparebtly occurred with the passing of her father, I am unable to find details) March 2022 VT storm >> ablation June 2022 appropriate  tx for VT and VF  Arrhythmia/AAD hx  VT VT ablation 03/08/21 (Dr. Lalla Brothers) Amiodarone started 2021 Mexiletine started 2022   AFib DCCV 2021   Past Medical History:  Diagnosis Date   Aneurysm of internal carotid artery    Anti-phospholipid syndrome (HCC)    Anticardiolipin antibody positive    Chronic Coumadin   Brain aneurysm    followed by Dr.  Corliss Skains   Chronic combined systolic (congestive) and diastolic (congestive) heart failure (HCC)    Cocaine abuse in remission [facility])    Coronary atherosclerosis of native coronary artery    Previous invasive cardiac testing was done at a facility in [place], [place].  Occluded diagonal, Diffuse LAD disease, Occluded Cx, and non obstructive RCA.    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 Dutch Quint L1 cage, 5 disc fusion   CARDIAC DEFIBRILLATOR PLACEMENT  2008   [employer] ICD   CARDIOVERSION N/A 12/01/2020   Procedure: CARDIOVERSION;  Surgeon: [name], MD;  Location: [facility] ENDOSCOPY;  Service: Cardiovascular;  Laterality: N/A;   CORONARY STENT INTERVENTION N/A 01/08/2021   Procedure: CORONARY STENT INTERVENTION;  Surgeon: [place], Peter M, MD;  Location: [facility] INVASIVE CV LAB;  Service: Cardiovascular;  Laterality: N/A;   EP IMPLANTABLE DEVICE N/A 03/01/2016   Procedure: ICD Generator Changeout;  Surgeon: [name], MD;  Location: [facility] INVASIVE CV LAB;  Service: Cardiovascular;  Laterality: N/A;   ICD/BIV ICD FUNCTION(DFT) TEST N/A 03/14/2021   Procedure: ICD/BIV ICD FUNCTION (DFT) TEST;  Surgeon: [name], MD;  Location: [facility] INVASIVE CV LAB;  Service: Cardiovascular;  Laterality: N/A;   IR ANGIO INTRA EXTRACRAN SEL COM  CAROTID INNOMINATE BILAT MOD SED  05/29/2023   IR ANGIO VERTEBRAL SEL SUBCLAVIAN INNOMINATE BILAT MOD SED  05/29/2023   IR RADIOLOGIST EVAL & MGMT  04/16/2023   IR US GUIDE VASC ACCESS RIGHT  05/29/2023   L1 corpectomy   11/29/2009   Laryngeal polyp excision     RIGHT/LEFT HEART CATH AND CORONARY ANGIOGRAPHY N/A 12/18/2020   Procedure: RIGHT/LEFT HEART CATH AND CORONARY ANGIOGRAPHY;  Surgeon: Dolores Patty, MD;  Location: [facility] INVASIVE CV LAB;  Service: Cardiovascular;  Laterality: N/A;   RIGHT/LEFT HEART CATH AND CORONARY ANGIOGRAPHY N/A 03/05/2021   Procedure: RIGHT/LEFT HEART CATH AND CORONARY ANGIOGRAPHY;  Surgeon: [name], MD;  Location: [facility] INVASIVE CV LAB;  Service: Cardiovascular;  Laterality: N/A;   TEE WITHOUT CARDIOVERSION N/A 12/01/2020   Procedure: TRANSESOPHAGEAL ECHOCARDIOGRAM (TEE);  Surgeon: [name], MD;  Location: [facility] ENDOSCOPY;  Service: Cardiovascular;  Laterality: N/AFloyce Stakes ABLATION N/A 03/08/2021   Procedure: Floyce Stakes ABLATION;  Surgeon: Lanier Prude, MD;  Location: [facility] INVASIVE CV LAB;  Service: Cardiovascular;  Laterality: N/A;  Current Outpatient Medications  Medication Sig Dispense Refill   amiodarone (PACERONE) 200 MG tablet TAKE 1 AND 1/2 TABLETS BY MOUTH MONDAY-THURSDAY. TAKE 2 TABLETS BY MOUTH FRIDAY-SUNDAY 144 tablet 3   carvedilol (COREG) 12.5 MG tablet Take 1 tablet (12.5 mg total) by mouth 2 (two) times daily with a meal. NEEDS FOLLOW UP APPOINTMENT FOR MORE REFILLS 30 tablet 4   cetirizine (ZYRTEC ALLERGY) 10 MG tablet Take 1 tablet (10 mg total) by mouth daily. 90 tablet 1   DULoxetine (CYMBALTA) 60 MG capsule TAKE ONE CAPSULE BY MOUTH DAILY 30 capsule 0   empagliflozin (JARDIANCE) 10 MG TABS tablet Take 1 tablet (10 mg total) by mouth daily before breakfast. 90 tablet 3   EPINEPHRINE 0.3 mg/0.3 mL IJ SOAJ injection USE AS DIRECTED 2 each 2   ezetimibe (ZETIA) 10 MG tablet TAKE 1 TABLET BY MOUTH EVERY DAY (patient needs office visit  FOR further refills) 30 tablet 1   fluticasone (FLONASE) 50 MCG/ACT nasal spray INSTILL 2 SPRAYS IN EACH NOSTRIL EVERY DAY 16 g 6   isosorbide mononitrate (IMDUR) 30 MG 24 hr tablet Take 1.5 tablets (45 Mg total) in the morning, and take 1 tablet (30 Mg) total in the evening 225 tablet 2   mexiletine (MEXITIL) 150 MG capsule TAKE TWO CAPSULES BY MOUTH THREE TIMES DAILY 540 capsule 3   Multiple Vitamins-Minerals (MULTIVITAMINS THER. W/MINERALS) TABS Take 1 tablet by mouth daily.       nitroGLYCERIN (NITROSTAT) 0.4 MG SL tablet PLACE 1 TABLET UNDER THE TONGUE EVERY 5 MINUTES FOR 3 DOSES AS NEEDED CHEST PAIN 25 tablet 3   oxyCODONE-acetaminophen (PERCOCET) 10-325 MG tablet Take 1 tablet by mouth every 4 (four) hours as needed for pain.     pantoprazole (PROTONIX) 40 MG tablet TAKE 1 TABLET BY MOUTH DAILY 30 tablet 11   potassium chloride SA (KLOR-CON M) 20 MEQ tablet TAKE 2 TABLETS BY MOUTH EVERY MORNING and TAKE 1 TABLET EVERY EVENING 270 tablet 1   pravastatin (PRAVACHOL) 20 MG tablet Take 1 tablet (20 mg total) by mouth every evening. (Patient taking differently: Take 20 mg by mouth as directed. 3 x per week) 30 tablet 11   promethazine (PHENERGAN) 25 MG tablet TAKE 1 TABLET BY MOUTH EVERY 8 HOURS AS NEEDED 45 tablet 0   sacubitril-valsartan (ENTRESTO) 24-26 MG Take 1 tablet by mouth 2 (two) times daily. 180 tablet 3   sennosides-docusate sodium (SENOKOT-S) 8.6-50 MG tablet Take 1 tablet by mouth daily as needed.     spironolactone (ALDACTONE) 25 MG tablet Take 1 tablet (25 mg total) by mouth daily. 30 tablet 3   torsemide (DEMADEX) 20 MG tablet Take 1 tablet (20 mg total) by mouth 4 (four) times daily for 2 days. May take an additional 20 mg tablet daily for swelling (Patient taking differently: Takes 2 tabs every morning, 1 at lunch, & 1 at 7 pm) 10 tablet 0   traZODone (DESYREL) 100 MG tablet Take 1 tablet (100 mg total) by mouth at bedtime as needed for sleep. 90 tablet 1   Vericiguat (VERQUVO) 5  MG TABS TAKE 1 TABLET BY MOUTH ONCE DAILY 30 tablet 6   warfarin (COUMADIN) 5 MG tablet TAKE ONE-HALF TO 1 TABLET BY MOUTH EVERY DAY OR USE AS DIRECTED by coumadin clinic 30 tablet 5   No current facility-administered medications for this visit.    Allergies:   Beef-derived drug products, Metoclopramide hcl, Penicillins, Pork-derived products, Metoprolol, and Statins   Social History:  The  patient  reports that she quit smoking about 2 years ago. Her smoking use included cigarettes and e-cigarettes. She started smoking about 45 years ago. She has a 10.8 pack-year smoking history. She uses smokeless tobacco. She reports that she does not drink alcohol and does not use drugs.   Family History:  The patient's family history includes Ankylosing spondylitis in her sister; Heart attack in her brother; Heart disease in her sister; Heart disease (age of onset: 58) in her father; Hyperlipidemia in her father; Hypertension in her father and sister; Stomach cancer (age of onset: 63) in her brother; Uterine cancer in her mother.  ROS:  Please see the history of present illness.   All other systems are reviewed and otherwise negative.   PHYSICAL EXAM:  VS:  There were no vitals taken for this visit. BMI: There is no height or weight on file to calculate BMI. Well nourished, well developed, in no acute distress  HEENT: normocephalic, atraumatic  Neck: no JVD, carotid bruits or masses Cardiac: RRR; no significant murmurs, no rubs, or gallops Lungs: CTA b/l, no wheezing, rhonchi or rales  Abd: soft, nontender MS: no deformity or atrophy Ext: no edema  Skin: warm and dry, no rash Neuro:  No gross deficits appreciated Psych: euthymic mood, full affect  ICD site is stable, no tethering or discomfort   EKG:  not done today   ICD interrogation done today and reviewed by myself:  Battery and lead measurements are good She has had one true NSVT A few short SVTs, no therapies, noting 99% morphology  match (she has no HV therapies programmed in VT 1 zone) None of these since 12/12/23 and only 3 since our last visit Foot print looks good, wide and shtfted a bit left again    Echo 06/2023:  1. Left ventricular ejection fraction, by estimation, is 30 to 35%. The  left ventricle has moderately decreased function. The left ventricle  demonstrates regional wall motion abnormalities (see scoring  diagram/findings for description). The left  ventricular internal cavity size was moderately dilated. Left ventricular  diastolic parameters are consistent with Grade I diastolic dysfunction  (impaired relaxation).   2. Right ventricular systolic function is normal. The right ventricular  size is normal. There is normal pulmonary artery systolic pressure.   3. Left atrial size was mild to moderately dilated.   4. The mitral valve is normal in structure. Mild mitral valve  regurgitation. No evidence of mitral stenosis.   5. The aortic valve has an indeterminant number of cusps. Aortic valve  regurgitation is not visualized. No aortic stenosis is present.   6. The inferior vena cava is normal in size with greater than 50%  respiratory variability, suggesting right atrial pressure of 3 mmHg.   Comparison(s): No significant change from prior study. Prior images  reviewed side by side.  3/10/'22: EPS/ablation CONCLUSIONS: 1. Sinus rhythm upon presentation  2. Inducible ventricular tachycardia. (multiple morphologies, most originating from the apical lateral region of the LV. 2 dominant morphologies were negative throughout the precordium, QS in lead I and negative/positive in the inferior leads).  3. Successful scar homogenization of the anterolateral/lateral scar extending from the apex to the mitral valve annulus.  4. A large anterior/anterolateral/lateral LV scar detailed with voltage mapping c/w prior coronary artery disease history 5. No early apparent complications. 6. Plan to restart heparin  after 6 hours bedrest. Coumadin to restart today. 7. Plan to assess ICD with DFT testing early next week with possible system  revision. 8. Continue mexiletine and amiodarone. 9. If repeat ablation required in the future, will require hemodynamic support.   Right/Left heart cath 02/2021: 1. Low filling pressures.  2. Preserved cardiac output.  3. Stable coronaries, patent LCx stent.  Known chronic occlusion of D1.  06/19/17: TTE Study Conclusions - Left ventricle: The cavity size was severely dilated. Wall   thickness was normal. Doppler parameters are consistent with   abnormal left ventricular relaxation (grade 1 diastolic   dysfunction). - Mitral valve: There was mild regurgitation. - Left atrium: The atrium was mildly dilated. LVEF is approximately 30% with severe hypokinesis / akinesis fo the inferior , distal anterior, and apical walls The cavity size was severely dilated  Recent Labs: 04/17/2023: B Natriuretic Peptide 184.9 12/29/2023: ALT 42; BUN 14; Creatinine, Ser 1.20; Hemoglobin 13.8; Platelets 138; Potassium 4.4; Sodium 138; TSH 2.370  No results found for requested labs within last 365 days.   CrCl cannot be calculated (Patient's most recent lab result is older than the maximum 21 days allowed.).   Wt Readings from Last 3 Encounters:  12/05/23 221 lb (100.2 kg)  09/18/23 212 lb (96.2 kg)  09/11/23 213 lb 12.8 oz (97 kg)     Other studies reviewed: Additional studies/records reviewed today include: summarized above  ASSESSMENT AND PLAN:  1. ICD     Intact function     no programming changes made  Device check today to ensure her tachycardias remained improved, back on her medications (discussed at last visit)  2. VT Chronically on: Amiodarone Mexiletine Labs are UTD One NSVT only     3. Paroxysmal Afib CHA2DS2Vasc is 5, on Warfarin (monitored at the [facility] office) No AFib noted   4. ICM  5. Chronic CHF     On BB/entresto, diuretics, rare extra/PRN  dosing     Appears euvolemic     Foot print looks good     C/w Dr. Diona Browner  6. CAD     No anginal symptoms     On BB, nitrate, statin     C/w Dr. McDowell/team   7. Hypercoagulable state Anticardiolipin antibody positive Also w/AFib On warfarin    Disposition: remotes as usual, have her back in 6 mo, sooner if needed with EP service  Current medicines are reviewed at length with the patient today.  The patient did not have any concerns regarding medicines.  Norma Fredrickson, PA-C 01/25/2024 11:50 AM     [facility] [address] [place] [address] [phone] (office)  [phone] (fax)

## 2024-01-26 ENCOUNTER — Ambulatory Visit: Payer: PPO

## 2024-01-28 ENCOUNTER — Ambulatory Visit: Payer: PPO | Attending: Physician Assistant | Admitting: Physician Assistant

## 2024-01-28 VITALS — BP 94/60 | HR 88 | Ht 65.5 in | Wt 220.0 lb

## 2024-01-28 DIAGNOSIS — I255 Ischemic cardiomyopathy: Secondary | ICD-10-CM | POA: Diagnosis not present

## 2024-01-28 DIAGNOSIS — Z95810 Presence of automatic (implantable) cardiac defibrillator: Secondary | ICD-10-CM

## 2024-01-28 DIAGNOSIS — D6869 Other thrombophilia: Secondary | ICD-10-CM | POA: Diagnosis not present

## 2024-01-28 DIAGNOSIS — I471 Supraventricular tachycardia, unspecified: Secondary | ICD-10-CM | POA: Diagnosis not present

## 2024-01-28 DIAGNOSIS — I472 Ventricular tachycardia, unspecified: Secondary | ICD-10-CM

## 2024-01-28 DIAGNOSIS — I5022 Chronic systolic (congestive) heart failure: Secondary | ICD-10-CM | POA: Diagnosis not present

## 2024-01-28 DIAGNOSIS — I480 Paroxysmal atrial fibrillation: Secondary | ICD-10-CM

## 2024-01-28 LAB — CUP PACEART INCLINIC DEVICE CHECK
Date Time Interrogation Session: 20250129181420
HighPow Impedance: 50 Ohm
HighPow Impedance: 58 Ohm
Implantable Lead Connection Status: 753985
Implantable Lead Connection Status: 753985
Implantable Lead Implant Date: 20081119
Implantable Lead Implant Date: 20081119
Implantable Lead Location: 753859
Implantable Lead Location: 753860
Implantable Lead Model: 185
Implantable Lead Model: 4136
Implantable Lead Serial Number: 211124
Implantable Lead Serial Number: 28383796
Implantable Pulse Generator Implant Date: 20170303
Lead Channel Impedance Value: 601 Ohm
Lead Channel Impedance Value: 610 Ohm
Lead Channel Pacing Threshold Amplitude: 0.6 V
Lead Channel Pacing Threshold Amplitude: 1.1 V
Lead Channel Pacing Threshold Pulse Width: 0.5 ms
Lead Channel Pacing Threshold Pulse Width: 0.5 ms
Lead Channel Sensing Intrinsic Amplitude: 2.5 mV
Lead Channel Sensing Intrinsic Amplitude: 25 mV
Lead Channel Setting Pacing Amplitude: 2 V
Lead Channel Setting Pacing Amplitude: 2.4 V
Lead Channel Setting Pacing Pulse Width: 0.5 ms
Lead Channel Setting Sensing Sensitivity: 0.6 mV
Pulse Gen Serial Number: 204398

## 2024-01-28 NOTE — Patient Instructions (Signed)
Medication Instructions:   Your physician recommends that you continue on your current medications as directed. Please refer to the Current Medication list given to you today.  *If you need a refill on your cardiac medications before your next appointment, please call your pharmacy*   Lab Work: NONE ORDERED  TODAY   If you have labs (blood work) drawn today and your tests are completely normal, you will receive your results only by: MyChart Message (if you have MyChart) OR A paper copy in the mail If you have any lab test that is abnormal or we need to change your treatment, we will call you to review the results.   Testing/Procedures: NONE ORDERED  TODAY    Follow-Up: At [facility], you and your health needs are our priority.  As part of our continuing mission to provide you with exceptional heart care, we have created designated Provider Care Teams.  These Care Teams include your primary Cardiologist (physician) and Advanced Practice Providers (APPs -  Physician Assistants and Nurse Practitioners) who all work together to provide you with the care you need, when you need it.  We recommend signing up for the patient portal called "MyChart".  Sign up information is provided on this After Visit Summary.  MyChart is used to connect with patients for Virtual Visits (Telemedicine).  Patients are able to view lab/test results, encounter notes, upcoming appointments, etc.  Non-urgent messages can be sent to your provider as well.   To learn more about what you can do with MyChart, go to [email or web address].    Your next appointment:   6 month(s)  Provider:   You may see Lewayne Bunting, MD or one of the following Advanced Practice Providers on your designated Care Team:   Francis Dowse, [place]  Other Instructions

## 2024-01-29 ENCOUNTER — Ambulatory Visit: Payer: PPO | Admitting: Family

## 2024-02-03 ENCOUNTER — Ambulatory Visit (INDEPENDENT_AMBULATORY_CARE_PROVIDER_SITE_OTHER): Payer: PPO

## 2024-02-03 DIAGNOSIS — Z23 Encounter for immunization: Secondary | ICD-10-CM

## 2024-02-03 NOTE — Progress Notes (Signed)
Patient is in office today for a nurse visit for Immunization. Patient Injection was given in the  Right deltoid. Patient tolerated injection well. 2nd shingrix

## 2024-02-05 ENCOUNTER — Other Ambulatory Visit: Payer: Self-pay | Admitting: Cardiology

## 2024-02-07 ENCOUNTER — Other Ambulatory Visit: Payer: Self-pay | Admitting: Family

## 2024-02-07 DIAGNOSIS — F411 Generalized anxiety disorder: Secondary | ICD-10-CM

## 2024-02-07 DIAGNOSIS — F4310 Post-traumatic stress disorder, unspecified: Secondary | ICD-10-CM

## 2024-02-12 ENCOUNTER — Ambulatory Visit: Payer: PPO | Attending: Cardiology | Admitting: *Deleted

## 2024-02-12 DIAGNOSIS — I4819 Other persistent atrial fibrillation: Secondary | ICD-10-CM | POA: Diagnosis not present

## 2024-02-12 DIAGNOSIS — Z5181 Encounter for therapeutic drug level monitoring: Secondary | ICD-10-CM | POA: Diagnosis not present

## 2024-02-12 DIAGNOSIS — D6859 Other primary thrombophilia: Secondary | ICD-10-CM

## 2024-02-12 LAB — POCT INR: INR: 2.5 (ref 2.0–3.0)

## 2024-02-12 NOTE — Patient Instructions (Signed)
Continue warfarin 1/2 tablet daily except 1 tablet on Mondays, Wednesdays and Fridays. Recheck 6 weeks

## 2024-02-13 DIAGNOSIS — G894 Chronic pain syndrome: Secondary | ICD-10-CM | POA: Diagnosis not present

## 2024-02-13 DIAGNOSIS — Z79899 Other long term (current) drug therapy: Secondary | ICD-10-CM | POA: Diagnosis not present

## 2024-02-23 ENCOUNTER — Encounter (HOSPITAL_COMMUNITY): Payer: Self-pay | Admitting: Cardiology

## 2024-02-23 ENCOUNTER — Ambulatory Visit (HOSPITAL_COMMUNITY)
Admission: RE | Admit: 2024-02-23 | Discharge: 2024-02-23 | Disposition: A | Discharge status: Home / Self Care | Payer: PPO | Source: Ambulatory Visit | Attending: Cardiology | Admitting: Cardiology

## 2024-02-23 VITALS — BP 100/60 | HR 82 | Wt 216.4 lb

## 2024-02-23 DIAGNOSIS — I255 Ischemic cardiomyopathy: Secondary | ICD-10-CM | POA: Diagnosis not present

## 2024-02-23 DIAGNOSIS — Z7901 Long term (current) use of anticoagulants: Secondary | ICD-10-CM | POA: Diagnosis not present

## 2024-02-23 DIAGNOSIS — Z87891 Personal history of nicotine dependence: Secondary | ICD-10-CM | POA: Diagnosis not present

## 2024-02-23 DIAGNOSIS — I472 Ventricular tachycardia, unspecified: Secondary | ICD-10-CM | POA: Insufficient documentation

## 2024-02-23 DIAGNOSIS — E669 Obesity, unspecified: Secondary | ICD-10-CM | POA: Diagnosis not present

## 2024-02-23 DIAGNOSIS — I2510 Atherosclerotic heart disease of native coronary artery without angina pectoris: Secondary | ICD-10-CM | POA: Diagnosis not present

## 2024-02-23 DIAGNOSIS — I4819 Other persistent atrial fibrillation: Secondary | ICD-10-CM | POA: Insufficient documentation

## 2024-02-23 DIAGNOSIS — I6521 Occlusion and stenosis of right carotid artery: Secondary | ICD-10-CM | POA: Insufficient documentation

## 2024-02-23 DIAGNOSIS — R2689 Other abnormalities of gait and mobility: Secondary | ICD-10-CM | POA: Diagnosis not present

## 2024-02-23 DIAGNOSIS — Z8673 Personal history of transient ischemic attack (TIA), and cerebral infarction without residual deficits: Secondary | ICD-10-CM | POA: Insufficient documentation

## 2024-02-23 DIAGNOSIS — I252 Old myocardial infarction: Secondary | ICD-10-CM | POA: Diagnosis not present

## 2024-02-23 DIAGNOSIS — Z79899 Other long term (current) drug therapy: Secondary | ICD-10-CM | POA: Diagnosis not present

## 2024-02-23 DIAGNOSIS — I5042 Chronic combined systolic (congestive) and diastolic (congestive) heart failure: Secondary | ICD-10-CM | POA: Diagnosis not present

## 2024-02-23 DIAGNOSIS — I739 Peripheral vascular disease, unspecified: Secondary | ICD-10-CM | POA: Insufficient documentation

## 2024-02-23 DIAGNOSIS — D6861 Antiphospholipid syndrome: Secondary | ICD-10-CM | POA: Diagnosis not present

## 2024-02-23 DIAGNOSIS — Z95810 Presence of automatic (implantable) cardiac defibrillator: Secondary | ICD-10-CM | POA: Diagnosis not present

## 2024-02-23 DIAGNOSIS — M170 Bilateral primary osteoarthritis of knee: Secondary | ICD-10-CM | POA: Insufficient documentation

## 2024-02-23 DIAGNOSIS — Z6835 Body mass index (BMI) 35.0-35.9, adult: Secondary | ICD-10-CM | POA: Insufficient documentation

## 2024-02-23 DIAGNOSIS — I5022 Chronic systolic (congestive) heart failure: Secondary | ICD-10-CM | POA: Insufficient documentation

## 2024-02-23 DIAGNOSIS — I25119 Atherosclerotic heart disease of native coronary artery with unspecified angina pectoris: Secondary | ICD-10-CM

## 2024-02-23 LAB — COMPREHENSIVE METABOLIC PANEL
ALT: 48 U/L — ABNORMAL HIGH (ref 0–44)
AST: 45 U/L — ABNORMAL HIGH (ref 15–41)
Albumin: 3.5 g/dL (ref 3.5–5.0)
Alkaline Phosphatase: 82 U/L (ref 38–126)
Anion gap: 15 (ref 5–15)
BUN: 16 mg/dL (ref 6–20)
CO2: 23 mmol/L (ref 22–32)
Calcium: 8.6 mg/dL — ABNORMAL LOW (ref 8.9–10.3)
Chloride: 99 mmol/L (ref 98–111)
Creatinine, Ser: 1.16 mg/dL — ABNORMAL HIGH (ref 0.44–1.00)
GFR, Estimated: 54 mL/min — ABNORMAL LOW (ref >60)
Glucose, Bld: 84 mg/dL (ref 70–99)
Potassium: 3.7 mmol/L (ref 3.5–5.1)
Sodium: 137 mmol/L (ref 135–145)
Total Bilirubin: 0.6 mg/dL (ref 0.0–1.2)
Total Protein: 7.2 g/dL (ref 6.5–8.1)

## 2024-02-23 LAB — LIPID PANEL
Cholesterol: 142 mg/dL (ref 0–200)
HDL: 54 mg/dL (ref >40)
LDL Cholesterol: 78 mg/dL (ref 0–99)
Total CHOL/HDL Ratio: 2.6 ratio
Triglycerides: 52 mg/dL (ref <150)
VLDL: 10 mg/dL (ref 0–40)

## 2024-02-23 LAB — TSH: TSH: 2.608 u[IU]/mL (ref 0.350–4.500)

## 2024-02-23 MED ORDER — ISOSORBIDE MONONITRATE ER 30 MG PO TB24: 30.0000 mg | ORAL_TABLET | Freq: Two times a day (BID) | ORAL | 2 refills | Status: DC

## 2024-02-23 MED ORDER — TORSEMIDE 20 MG PO TABS: 40.0000 mg | ORAL_TABLET | Freq: Two times a day (BID) | ORAL | 3 refills | Status: DC

## 2024-02-23 NOTE — Patient Instructions (Signed)
 CHANGE Torsemide to 40 mg Twice daily  CHANGE Imdur to 30 mg Twice daily  Labs done today, your results will be available in MyChart, we will contact you for abnormal readings.  You have been referred to the [facility].  They will call you to arrange your appointment.  Your provider has ordered doppler of your legs. You will be called to have this test arranged.  Your physician recommends that you schedule a follow-up appointment in: 4 months.  If you have any questions or concerns before your next appointment please send [place] a message through [place] or call our office at [phone].    TO LEAVE A MESSAGE FOR THE NURSE SELECT OPTION 2, PLEASE LEAVE A MESSAGE INCLUDING: YOUR NAME DATE OF BIRTH CALL BACK NUMBER REASON FOR CALL**this is important as we prioritize the call backs  YOU WILL RECEIVE A CALL BACK THE SAME DAY AS LONG AS YOU CALL BEFORE 4:00 PM  At the [facility], you and your health needs are our priority. As part of our continuing mission to provide you with exceptional heart care, we have created designated Provider Care Teams. These Care Teams include your primary Cardiologist (physician) and Advanced Practice Providers (APPs- Physician Assistants and Nurse Practitioners) who all work together to provide you with the care you need, when you need it.   You may see any of the following providers on your designated Care Team at your next follow up: Dr [name] Dr [name] Dr. Dorthula Nettles Dr. Clearnce Hasten Amy Filbert Schilder, NP Robbie Lis, [place] [facility] [place], [place] Brynda Peon, NP [place] [name], NP Clarisa Kindred, NP Karle Plumber, PharmD Enos Fling, PharmD   Please be sure to bring in all your medications bottles to every appointment.    Thank you for choosing [facility]

## 2024-02-24 NOTE — Progress Notes (Signed)
 PCP: Junie Spencer, FNP Cardiology: Dr. Diona Browner HF Cardiology: Dr. Shirlee Latch  Chief complaint: CHF  63 y.o. with history of CVA, anti-phospholipid antibody syndrome, CAD, ischemic cardiomyopathy, and persistent atrial fibrillation was referred by Dr. Diona Browner for evaluation of CHF. Patient had a cath in 2008 in setting of an MI in [place].  Diagonal and LCx were totally occluded, there was diffuse LAD disease.  She has not had a cath since that time. She has a Environmental manager ICD.  Echo in 8/21 showed EF 15-20%, anteroseptal akinesis, normal RV, severe LAE, mild MR. She was admitted in 8/21 with CHF exacerbation for 6 days.  She went into atrial fibrillation in 11/21.   In 12/21, she had TEE-guided DCCV back to NSR.  TEE showed EF 25-30%, mildly decreased RV systolic function, moderate MR.    Later in 12/21, she was admitted with syncope and VT with ICD shock.  RHC/LHC showed chronically occluded D1 and 95% proximal LCx; normal filling pressures with CI 3.0. Platelets were low, no intervention was done. Amiodarone was started.   In 1/22, she was admitted again with VT and presyncope, HS-TnI was negative.  She had PCI to LCx this admission.  She was started on mexiletine in addition to amiodarone.   She was readmitted later in 1/22 with chest pain, HS-TnI was negative and she was discharged.   She was admitted again in 3/22 with recurrent VT.  LHC/RHC showed totally occluded D1 (chronic) with patent LCx stent and filling pressures were normal with normal CO.  She had a VT ablation with Dr. Elberta Fortis.   She was admitted with VT and syncope in 6/22, troponin was negative.  She had VT treated with ATP in 10/22, mexiletine was increased.   Echo in 1/23 showed EF 30-35%, LAD territory wall motion abnormalities, normal RV, IVC normal, moderate MR.  Echo in 7/24 showed EF 30-35%, normal RV, mild MR.   She returns for followup of CHF, VT, and CAD.  She is no longer smoking. Greatest limitation currently is  from knee pain (OA).  She has myalgias with statins and is only taking Zetia.  Weight is up 8 lbs since last time she was in this office. She uses a walker due to balance difficulty.  She does not get lightheaded.  She has had falls though not recently.  No syncope.  No dyspnea but not very active.  No orthopnea/PND.  No chest pain.   [employer] device interrogation: No AF, last NSVT run was in 12/24.   ECG (personally reviewed): NSR, septal Qs  Labs (8/21): K 4.4, creatinine 0.58, LDL 94  Labs (12/21): LDL 72, HDL 31, TGs 85, K 4.4, creatinine 0.8, hgb 14.6, plts 70K Labs (1/22): K 3.3, creatinine 0.94, plts 58K Labs (3/22): K 3.8, creatinine 0.99, hgb 11.9, plts 160 Labs (4/22): hgb 14.2, plts 152, digoxin 0.2, K 4.1, creatinine 1.0 Labs (10/22): LDL 104, AST 74, ALT 76, plts 114, K 4.3, creatinine 1.11 Labs (11/22): BNP 212, K 4, creatinine 1.03 Labs (8/24): LDL 91 Labs (12/24): K 4.4, creatinine 1.2  PMH: 1. Brain aneurysm: Followed by Dr. Corliss Skains.  2. HTN 3. CVA: Total occlusion of RICA.  - Carotid dopplers (12/21): Occluded RICA, < 50% stenosis LICA.  - Carotid dopplers (3/22): Occluded RICA, 1-39% LICA - Carotid dopplers (7/24): Occluded RICA, 1-39% LICA 4. Anti-phospholipid antibody syndrome: Diagnosed at [facility].  - On warfarin and ASA 81 given history of CVA.  5. Remote cocaine abuse.  6.  PAD: peripheral arterial dopplers in 3/21 with early left-sided iliac disease, right anterior tibial disease; ABI 1.0 on right and 0.75 on left.  - ABIs (11/22): 1.05 right, 0.9 left.  7. Prior smoker: PFTs in 4/23 showed normal spirometry.  8. L-spine arthritis.  9. CAD: MI in 2008 in [facility], cath at the time showed totally occluded diagonal, totally occluded LCX, and diffuse LAD disease.  No intervention.   - LHC (12/21): chronically occluded D1, 45% mid LAD, 95% proximal LCx.  - PCI to proximal LCx in 1/22.  - LHC (3/22): CTO D1, patent LCx stent.  10. Atrial fibrillation:  Persistent, started in 11/21.  - TEE-DCCV back to NSR in 12/21.  11. Chronic systolic CHF: Ischemic cardiomyopathy.  [employer] ICD.  - Echo (8/21): EF 15-20%, anteroseptal AK, RV normal, severe LAE, mild MR.  - TEE (12/21): EF 25-30%, mildly decreased RV systolic function, moderate MR with ERO 0.26 cm^2 (probably functional).   - RHC (12/21): mean RA 1, PA 24/6, mean PCWP 12, CI 3.0 - RHC (3/22): mean RA 1, PA 19/4, mean PCWP 7, CI 2.69 - Echo (1/23): EF 30-35%, LAD territory wall motion abnormalities, normal RV, IVC normal, moderate MR.  - Echo (7/24): EF 30-35%, normal RV, mild MR.  12. Hyperlipidemia: Statin intolerant.  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   14. Mitral regurgitation: Moderate on 12/21 TEE.  15. Ventricular tachycardia: 3/22 VT ablation.   FH: Father, grandfather with MIs.  Brother with MI in his 56s.   SH: Lives in [place], on disability.  Prior smoker, has quit.  Prior cocaine.  No ETOH.   ROS: All systems reviewed and negative except as per HPI.   Current Outpatient Medications  Medication Sig Dispense Refill   amiodarone (PACERONE) 200 MG tablet TAKE 1 AND 1/2 TABLETS BY MOUTH MONDAY-THURSDAY. TAKE 2 TABLETS BY MOUTH FRIDAY-SUNDAY 144 tablet 3   carvedilol (COREG) 12.5 MG tablet Take 1 tablet (12.5 mg total) by mouth 2 (two) times daily with a meal. NEEDS FOLLOW UP APPOINTMENT FOR MORE REFILLS 30 tablet 4   DULoxetine (CYMBALTA) 60 MG capsule TAKE ONE CAPSULE BY MOUTH DAILY 30 capsule 0   empagliflozin (JARDIANCE) 10 MG TABS tablet Take 1 tablet (10 mg total) by mouth daily before breakfast. 90 tablet 3   EPINEPHRINE 0.3 mg/0.3 mL IJ SOAJ injection USE AS DIRECTED 2 each 2   ezetimibe (ZETIA) 10 MG tablet TAKE 1 TABLET BY MOUTH EVERY DAY (patient needs office visit FOR further refills) 30 tablet 1   fluticasone (FLONASE) 50 MCG/ACT nasal spray Place 1 spray into both nostrils as needed for allergies or rhinitis.     mexiletine (MEXITIL) 150 MG capsule  TAKE TWO CAPSULES BY MOUTH THREE TIMES DAILY 540 capsule 3   Multiple Vitamins-Minerals (MULTIVITAMINS THER. W/MINERALS) TABS Take 1 tablet by mouth daily.       nitroGLYCERIN (NITROSTAT) 0.4 MG SL tablet PLACE 1 TABLET UNDER THE TONGUE EVERY 5 MINUTES FOR 3 DOSES AS NEEDED CHEST PAIN 25 tablet 3   oxyCODONE-acetaminophen (PERCOCET) 10-325 MG tablet Take 1 tablet by mouth every 4 (four) hours as needed for pain.     pantoprazole (PROTONIX) 40 MG tablet TAKE 1 TABLET BY MOUTH DAILY 30 tablet 11   potassium chloride SA (KLOR-CON M) 20 MEQ tablet TAKE 2 TABLETS BY MOUTH EVERY MORNING and TAKE 1 TABLET EVERY EVENING 270 tablet 1   pravastatin (PRAVACHOL) 20 MG tablet Take 20 mg by mouth 3 (three) times a week.  sacubitril-valsartan (ENTRESTO) 24-26 MG Take 1 tablet by mouth 2 (two) times daily. 180 tablet 3   sennosides-docusate sodium (SENOKOT-S) 8.6-50 MG tablet Take 1 tablet by mouth daily as needed.     spironolactone (ALDACTONE) 25 MG tablet Take 1 tablet (25 mg total) by mouth daily. 30 tablet 3   traZODone (DESYREL) 100 MG tablet Take 1 tablet (100 mg total) by mouth at bedtime as needed for sleep. 90 tablet 1   Vericiguat (VERQUVO) 5 MG TABS TAKE 1 TABLET BY MOUTH ONCE DAILY 30 tablet 6   warfarin (COUMADIN) 5 MG tablet TAKE ONE-HALF TO 1 TABLET BY MOUTH EVERY DAY OR USE AS DIRECTED by coumadin clinic 30 tablet 5   isosorbide mononitrate (IMDUR) 30 MG 24 hr tablet Take 1 tablet (30 mg total) by mouth 2 (two) times daily. 180 tablet 2   promethazine (PHENERGAN) 25 MG tablet TAKE 1 TABLET BY MOUTH EVERY 8 HOURS AS NEEDED (Patient not taking: Reported on 02/23/2024) 45 tablet 0   torsemide (DEMADEX) 20 MG tablet Take 2 tablets (40 mg total) by mouth 2 (two) times daily. 180 tablet 3   No current facility-administered medications for this encounter.   BP 100/60   Pulse 82   Wt 98.2 kg (216 lb 6.4 oz)   SpO2 96%   BMI 35.46 kg/m  General: NAD Neck: No JVD, no thyromegaly or thyroid nodule.   Lungs: Clear to auscultation bilaterally with normal respiratory effort. CV: Nondisplaced PMI.  Heart regular S1/S2, no S3/S4, no murmur.  No peripheral edema.  No carotid bruit.  Unable to palpate pedal pulses.  Abdomen: Soft, nontender, no hepatosplenomegaly, no distention.  Skin: Intact without lesions or rashes.  Neurologic: Alert and oriented x 3.  Psych: Normal affect. Extremities: No clubbing or cyanosis.  HEENT: Normal.   Assessment/Plan: 1. CAD: MI in 2008 in [facility], cath at the time showed totally occluded diagonal, totally occluded LCX, and diffuse LAD disease.  No intervention. LHC was done in 12/21 due to VT, this showed chronically occluded D1 and 95% proximal LCx.  Initially, no PCI due to thrombocytopenia.  She eventually had PCI to proximal LCx in 1/22.  Repeat cath in 3/22 showed stable CTO D1 and patent LCx stent. No chest pain.  - Continue Zetia, has not tolerated statins.  Check lipids today, if LDL > 55, will refer to lipid clinic for Repatha.  - She is on warfarin, no ASA with stable CAD.  - I am not sure she needs Imdur.  Decrease to 30 mg bid today and continue to titrate down to off if possible.  2. Atrial fibrillation: TEE-guided DCCV to NSR in 12/21.  She is in NSR today.   - She is on amiodarone.  If atrial fibrillation recurs, will need to consider ablation. Check LFTs and TSH today, she will need a regular eye exam.  - Continue warfarin.   3. Carotid stenosis: Known RICA occlusion.   - Repeat dopplers in 7/25.  4. 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  - She is on warfarin.  5. Chronic systolic CHF: Ischemic cardiomyopathy.  [employer] ICD.  Echo (8/21) with EF 15-20%, anteroseptal AK, RV normal, severe LAE, mild MR.  TEE (12/21) with EF 25-30%.  RHC in 12/21 and again in 3/22 with preserved cardiac output and optimized filling pressures.  Echo in 7/24 showed EF 30-35%, normal RV, mild MR. Weight is up but she does not look volume  overloaded by exam.  NYHA class II-III,  most limited by knee pain.  - Continue Entresto 24/26 bid.  BP looks too low to increase.  BMET/BNP today. - Continue Jardiance.   - Continue spironolactone 25 mg daily.  - Continue Coreg 12.5 mg bid.  BP looks too low to increase.  - Consolidate her torsemide to 40 mg bid.  - She is on vericiguat 5 mg daily (will try to titrate off Imdur).  6. Smoking: She has quit smoking.  PFTs in 4/23 showed normal spirometry.  7. PAD: No definite claudication, no pedal ulcerations. ABIs 11/22 with mild decrease on left, normal on right. She has quit smoking.  - Check ABIs.  8. VT: Suspect scar-mediated in 12/21 with ICD discharge and syncope. She had recurrent VT in 3/22.  She had VT ablation in 3/22.  Recent recurrent VT in 10/22 with ATP.  She is now on amiodarone 300 mg daily alternating with 200 mg daily + mexiletine.  Last NSVT was in 12/24 by device interrogation.   - Continue amiodarone with dosing per EP.  CMET/TSH today.  She will need a regular eye exam.    - Continue mexiletine.  - Continue Coreg.   9. Obesity: She is interested in GLP-1 agonist.  Will refer to pharmacy clinic to see if she gets coverage.   Followup in 4 months with APP.   I spent 31 minutes reviewing data, interviewing patient, and organizing the orders/followup.    [name] 02/24/2024

## 2024-02-26 ENCOUNTER — Telehealth (HOSPITAL_COMMUNITY): Payer: Self-pay

## 2024-02-26 ENCOUNTER — Ambulatory Visit: Payer: PPO | Admitting: Family

## 2024-02-26 DIAGNOSIS — I5042 Chronic combined systolic (congestive) and diastolic (congestive) heart failure: Secondary | ICD-10-CM

## 2024-02-26 NOTE — Telephone Encounter (Signed)
-----   Message from [employer] sent at 02/25/2024  1:12 AM EST ----- Goal LDL < 55, refer to lipid clinic for Repatha.  Will need to repeat LFTs in 6 wks.

## 2024-02-26 NOTE — Telephone Encounter (Signed)
 Patient's lab orders placed and appointment scheduled. In addition, pt's referral to the lipid clinic has also been placed and someone will give her a call to get scheduled from their office. Pt aware, agreeable, and verbalized understanding.

## 2024-02-27 ENCOUNTER — Telehealth: Payer: Self-pay

## 2024-02-27 ENCOUNTER — Ambulatory Visit: Payer: Medicare PPO

## 2024-02-27 NOTE — Telephone Encounter (Signed)
 Alert received from [employer] for Antitachycardia pacing (ATP) therapy delivered to convert arrhythmia Event occurred 2/27 @ 22:52, 26sec in duration, EGM c/w SVT transitioning to sustained VT, mean HR 135, pace terminated with 1 burst of ATP, return to NSVT, NSVT event for ongoing event, falling below VT-1 detection rate of 130.  Patient reports she felt palpations yesterday while doing things around the house. Denies chest pain or other associating symptoms. Patient reports compliance with medications and doses. Patient advised of [place] DMV driving restrictions x6 months/shock plan. Patient does not drive.

## 2024-03-02 DIAGNOSIS — B351 Tinea unguium: Secondary | ICD-10-CM | POA: Diagnosis not present

## 2024-03-02 DIAGNOSIS — M79676 Pain in unspecified toe(s): Secondary | ICD-10-CM | POA: Diagnosis not present

## 2024-03-03 NOTE — Telephone Encounter (Signed)
 No change in treatment. She has a lot of VT.

## 2024-03-04 ENCOUNTER — Other Ambulatory Visit: Payer: Self-pay | Admitting: Family

## 2024-03-04 DIAGNOSIS — R110 Nausea: Secondary | ICD-10-CM

## 2024-03-08 ENCOUNTER — Other Ambulatory Visit: Payer: Self-pay | Admitting: Cardiology

## 2024-03-08 ENCOUNTER — Other Ambulatory Visit: Payer: Self-pay | Admitting: Family

## 2024-03-08 ENCOUNTER — Other Ambulatory Visit (HOSPITAL_COMMUNITY): Payer: Self-pay | Admitting: Cardiology

## 2024-03-08 DIAGNOSIS — F4310 Post-traumatic stress disorder, unspecified: Secondary | ICD-10-CM

## 2024-03-08 DIAGNOSIS — G894 Chronic pain syndrome: Secondary | ICD-10-CM | POA: Diagnosis not present

## 2024-03-08 DIAGNOSIS — E6609 Other obesity due to excess calories: Secondary | ICD-10-CM | POA: Diagnosis not present

## 2024-03-08 DIAGNOSIS — F411 Generalized anxiety disorder: Secondary | ICD-10-CM

## 2024-03-08 DIAGNOSIS — Z6835 Body mass index (BMI) 35.0-35.9, adult: Secondary | ICD-10-CM | POA: Diagnosis not present

## 2024-03-08 DIAGNOSIS — M47816 Spondylosis without myelopathy or radiculopathy, lumbar region: Secondary | ICD-10-CM | POA: Diagnosis not present

## 2024-03-08 DIAGNOSIS — M1711 Unilateral primary osteoarthritis, right knee: Secondary | ICD-10-CM | POA: Diagnosis not present

## 2024-03-08 DIAGNOSIS — Z79899 Other long term (current) drug therapy: Secondary | ICD-10-CM | POA: Diagnosis not present

## 2024-03-08 DIAGNOSIS — I5042 Chronic combined systolic (congestive) and diastolic (congestive) heart failure: Secondary | ICD-10-CM

## 2024-03-08 NOTE — Telephone Encounter (Signed)
 Left message for pt to call back to schedule med refill appt in June with PCP.

## 2024-03-08 NOTE — Telephone Encounter (Signed)
 [facility] NTBS in June for 6 mos FU RF sent to pharmacy

## 2024-03-09 ENCOUNTER — Encounter: Payer: Self-pay | Admitting: Cardiology

## 2024-03-10 ENCOUNTER — Ambulatory Visit (INDEPENDENT_AMBULATORY_CARE_PROVIDER_SITE_OTHER): Payer: PPO

## 2024-03-10 DIAGNOSIS — I255 Ischemic cardiomyopathy: Secondary | ICD-10-CM | POA: Diagnosis not present

## 2024-03-11 ENCOUNTER — Encounter: Payer: Self-pay | Admitting: Internal Medicine

## 2024-03-11 ENCOUNTER — Encounter: Payer: Self-pay | Admitting: Family

## 2024-03-11 ENCOUNTER — Ambulatory Visit (INDEPENDENT_AMBULATORY_CARE_PROVIDER_SITE_OTHER): Admitting: Family

## 2024-03-11 VITALS — BP 102/61 | HR 84 | Temp 97.3°F | Ht 65.5 in | Wt 211.8 lb

## 2024-03-11 DIAGNOSIS — M545 Low back pain, unspecified: Secondary | ICD-10-CM | POA: Diagnosis not present

## 2024-03-11 DIAGNOSIS — L89322 Pressure ulcer of left buttock, stage 2: Secondary | ICD-10-CM | POA: Diagnosis not present

## 2024-03-11 DIAGNOSIS — I5042 Chronic combined systolic (congestive) and diastolic (congestive) heart failure: Secondary | ICD-10-CM | POA: Diagnosis not present

## 2024-03-11 LAB — CUP PACEART REMOTE DEVICE CHECK
Battery Remaining Longevity: 78 mo
Battery Remaining Percentage: 84 %
Brady Statistic RA Percent Paced: 1 %
Brady Statistic RV Percent Paced: 5 %
Date Time Interrogation Session: 20250312021100
HighPow Impedance: 51 Ohm
Implantable Lead Connection Status: 753985
Implantable Lead Connection Status: 753985
Implantable Lead Implant Date: 20081119
Implantable Lead Implant Date: 20081119
Implantable Lead Location: 753859
Implantable Lead Location: 753860
Implantable Lead Model: 185
Implantable Lead Model: 4136
Implantable Lead Serial Number: 211124
Implantable Lead Serial Number: 28383796
Implantable Pulse Generator Implant Date: 20170303
Lead Channel Impedance Value: 494 Ohm
Lead Channel Impedance Value: 555 Ohm
Lead Channel Pacing Threshold Amplitude: 0.6 V
Lead Channel Pacing Threshold Amplitude: 1.1 V
Lead Channel Pacing Threshold Pulse Width: 0.5 ms
Lead Channel Pacing Threshold Pulse Width: 0.5 ms
Lead Channel Setting Pacing Amplitude: 2 V
Lead Channel Setting Pacing Amplitude: 2.4 V
Lead Channel Setting Pacing Pulse Width: 0.5 ms
Lead Channel Setting Sensing Sensitivity: 0.6 mV
Pulse Gen Serial Number: 204398

## 2024-03-11 NOTE — Patient Instructions (Signed)
 Pressure Injury  A pressure injury, also called a pressure ulcer or bedsore, is an injury to skin and the tissue under the skin that is caused by pressure. It often affects people who must spend a long time in a bed or chair because of a medical condition. Pressure injuries often occur: Over bony parts of the body, such as the tailbone, shoulders, elbows, hips, heels, spine, ankles, and back of the head. Under medical devices that touch the body. These include stockings, equipment to help with breathing, tubes, and splints. Inside the mouth or nose from dentures or tubes. Pressure injuries start as red areas on the skin and can lead to pain and an open wound. What are the causes? This condition is caused by frequent or constant pressure to an area of the body. Less blood flow to the skin can make the tissue die and break down over time, causing a wound. What increases the risk? You are more likely to develop this condition if: You are in the hospital or an extended care facility. You are bedridden or in a wheelchair. You have an injury or disease that keeps you from moving well and feeling pain or pressure. You have a condition that: Makes you sleepy or less alert. Causes poor blood flow. You need to wear a medical device. You have poor control of your bladder or bowel movements (incontinence). You are not getting enough fluid or nutrients (malnutrition). Your health care provider may recommend certain types of mattresses, mattress covers, pillows, cushions, or boots to help prevent a pressure injury. These may include products filled with air, foam, gel, or sand. What are the signs or symptoms? Symptoms of this condition depend on how severe your injury is. Symptoms may include: Red or dark areas of the skin. Pain or a change in skin texture. Your skin may feel warmer, cooler, softer, or firmer. Blisters. An open wound. How is this diagnosed? This condition is diagnosed based on a  medical history and physical exam. You may also have tests, such as: Blood tests. Imaging tests. Blood flow tests. Your injury will be staged based on how severe it is. Staging is based on: How deep the tissue injury is. This includes whether muscle, bone, tendon, or dead tissue is exposed. The cause of the injury. How is this treated? This condition may be treated by: Reducing pressure on your skin. You may need to: Change your position often. Avoid positions that caused the wound or that may make the wound worse. Use certain mattresses, overlays, chair cushions, or protective boots. Move medical devices from an area of pressure, or place padding between the skin and the device. Use foams, creams, or powders to protect your skin from sweat, urine, and stool and reduce rubbing (friction) on the skin. Keeping your skin clean and dry. This may include using a skin cleanser or barrier as told by your health care provider. Cleaning your injury and getting rid of any dead tissue from the wound (debridement). Placing a protective medicine, such as a cream, or bandage (dressing) over your injury. Using medicines for pain or to prevent or treat infection. Surgery may be needed if other treatments are not working or if your injury is very deep. Follow these instructions at home: Medicines Take over-the-counter and prescription medicines only as told by your health care provider. If you were prescribed antibiotics, take or apply them as told by your health care provider. Do not stop using the antibiotic even if you start to  feel better. Eating and drinking Drink enough fluid to keep your urine pale yellow. Eat a healthy diet with lots of protein, as told by your health care provider. Do not use drugs or drink alcohol. Wound care Follow instructions from your health care provider about how to take care of your wound. Make sure you: Wash your hands with soap and water before and after you change  your dressing or apply medicine to your skin. If soap and water are not available, use hand sanitizer. Change your dressing as told by your health care provider. Check your wound every day for signs of infection. Have a caregiver do this for you if you are not able. Check for: Redness, swelling, or more pain. More fluid or blood. Warmth. Pus or a bad smell. Skin care Keep your skin clean and dry. Gently pat your skin dry. Do not rub or massage your skin. Check your skin every day for any changes in color or any new blisters or sores (ulcers). Reducing pressure Do not lie or sit in one position for a long time. Move or change position every 1-2 hours, or as told by your health care provider. Use pillows or cushions to reduce pressure. Ask your health care provider what cushions or pads you should use. General instructions Do not use any products that contain nicotine or tobacco. These products include cigarettes, chewing tobacco, and vaping devices, such as e-cigarettes. If you need help quitting, ask your health care provider. Try to be active every day. Ask your health care provider what exercises or activities are safe for you. Keep all follow-up visits. Your health care provider will check if your injury is healing. Contact a health care provider if: You have a fever or chills. You have pain that does not get better with medicine. Your skin changes color. You have new blisters or sores. You have signs of infection. Your wound does not get better after 1-2 weeks of treatment. This information is not intended to replace advice given to you by your health care provider. Make sure you discuss any questions you have with your health care provider. Document Revised: 06/11/2022 Document Reviewed: 05/17/2022 Elsevier Patient Education  2024 [employer].

## 2024-03-11 NOTE — Progress Notes (Signed)
 Subjective:    Patient ID: [name] [name], female    DOB: [birth year], 63 y.o.   MRN: [id number]  Chief Complaint  Patient presents with   Sore    Buttocks    PT presents to the office today with sore on her buttocks she noticed last week. Reports she lays in the her recliner and sleeping in her recliner because she can not lay flat. She has CHF and is followed by Cardiologists.   She bought a gel pillow from [employer] will start using it.  Congestive Heart Failure Presents for follow-up visit. Associated symptoms include fatigue and orthopnea. Pertinent negatives include no edema. The symptoms have been stable.  Back Pain This is a chronic problem. The current episode started more than 1 year ago. The problem occurs intermittently. The problem has been waxing and waning since onset. The pain is present in the lumbar spine. The quality of the pain is described as aching. The pain is at a severity of 7/10. The symptoms are aggravated by bending and standing.      Review of Systems  Constitutional:  Positive for fatigue.  Musculoskeletal:  Positive for back pain.  All other systems reviewed and are negative.   Social History   Socioeconomic History   Marital status: Single    Spouse name: Not on file   Number of children: Not on file   Years of education: Not on file   Highest education level: Master's degree (e.g., MA, MS, MEng, MEd, MSW, MBA)  Occupational History   Occupation: Disabled    Comment: ESL  Tobacco Use   Smoking status: Former    Current packs/day: 0.00    Average packs/day: 0.3 packs/day for 43.4 years (10.8 ttl pk-yrs)    Types: Cigarettes, E-cigarettes    Start date: 03/03/1978    Quit date: 07/21/2021    Years since quitting: 2.6   Smokeless tobacco: Current   Tobacco comments:    4 ciggs per day  Vaping Use   Vaping status: Every Day  Substance and Sexual Activity   Alcohol use: No    Alcohol/week: 0.0 standard drinks of alcohol   Drug use: No     Comment: Prior history of cocaine   Sexual activity: Not Currently    Birth control/protection: None  Other Topics Concern   Not on file  Social History Narrative   Not on file   Social Drivers of Health   Financial Resource Strain: Low Risk  (03/10/2024)   Overall Financial Resource Strain (CARDIA)    Difficulty of Paying Living Expenses: Not very hard  Food Insecurity: No Food Insecurity (03/10/2024)   Hunger Vital Sign    Worried About Running Out of Food in the Last Year: Never true    Ran Out of Food in the Last Year: Never true  Transportation Needs: No Transportation Needs (03/10/2024)   PRAPARE - Administrator, Civil Service (Medical): No    Lack of Transportation (Non-Medical): No  Physical Activity: Inactive (03/10/2024)   Exercise Vital Sign    Days of Exercise per Week: 0 days    Minutes of Exercise per Session: 0 min  Stress: Stress Concern Present (03/10/2024)   [employer] of Occupational Health - Occupational Stress Questionnaire    Feeling of Stress : To some extent  Social Connections: Moderately Isolated (03/10/2024)   Social Connection and Isolation Panel [NHANES]    Frequency of Communication with Friends and Family: More than three times a week  Frequency of Social Gatherings with Friends and Family: Once a week    Attends Religious Services: More than 4 times per year    Active Member of [employer] or Organizations: No    Attends Engineer, structural: Never    Marital Status: Never married   Family History  Problem Relation Age of Onset   Uterine cancer Mother    Heart disease Father 27   Hyperlipidemia Father    Hypertension Father    Ankylosing spondylitis Sister    Heart disease Sister    Hypertension Sister    Stomach cancer Brother 72   Heart attack Brother    Breast cancer Neg Hx         Objective:   Physical Exam Vitals reviewed.  Constitutional:      General: She is not in acute distress.    Appearance: She is  well-developed. She is obese.  HENT:     Head: Normocephalic and atraumatic.  Eyes:     Pupils: Pupils are equal, round, and reactive to light.  Neck:     Thyroid: No thyromegaly.  Cardiovascular:     Rate and Rhythm: Normal rate and regular rhythm.     Heart sounds: Normal heart sounds. No murmur heard. Pulmonary:     Effort: Pulmonary effort is normal. No respiratory distress.     Breath sounds: Normal breath sounds. No wheezing.  Abdominal:     General: Bowel sounds are normal. There is no distension.     Palpations: Abdomen is soft.     Tenderness: There is no abdominal tenderness.  Musculoskeletal:        General: No tenderness. Normal range of motion.     Cervical back: Normal range of motion and neck supple.  Skin:    General: Skin is warm and dry.          Comments: Stage 2 pressure ulcer   Neurological:     Mental Status: She is alert and oriented to person, place, and time.     Cranial Nerves: No cranial nerve deficit.     Motor: Weakness present.     Deep Tendon Reflexes: Reflexes are normal and symmetric.  Psychiatric:        Behavior: Behavior normal.        Thought Content: Thought content normal.        Judgment: Judgment normal.       BP 102/61   Pulse 84   Temp (!) 97.3 F (36.3 C) (Temporal)   Ht 5' 5.5" (1.664 m)   Wt 211 lb 12.8 oz (96.1 kg)   SpO2 96%   BMI 34.71 kg/m      Assessment & Plan:  [name] [name] comes in today with chief complaint of Sore (Buttocks )   Diagnosis and orders addressed:   1. Chronic combined systolic (congestive) and diastolic (congestive) heart failure (HCC) (Primary)   2. Pressure injury of left buttock, stage 2 (HCC)  3. Chronic bilateral low back pain without sciatica  Mepilex applied and reapply 72 hours  Change position every 2 hours After area closes can use calmoseptine as needed Keep follow up with Cardiologists     Jannifer Rodney, FNP

## 2024-03-17 ENCOUNTER — Encounter: Payer: Self-pay | Admitting: Cardiology

## 2024-03-24 ENCOUNTER — Ambulatory Visit: Attending: Cardiology | Admitting: *Deleted

## 2024-03-24 DIAGNOSIS — D6859 Other primary thrombophilia: Secondary | ICD-10-CM | POA: Diagnosis not present

## 2024-03-24 DIAGNOSIS — Z5181 Encounter for therapeutic drug level monitoring: Secondary | ICD-10-CM

## 2024-03-24 DIAGNOSIS — I4819 Other persistent atrial fibrillation: Secondary | ICD-10-CM | POA: Diagnosis not present

## 2024-03-24 LAB — POCT INR: INR: 2.2 (ref 2.0–3.0)

## 2024-03-24 NOTE — Patient Instructions (Signed)
 Continue warfarin 1/2 tablet daily except 1 tablet on Mondays, Wednesdays and Fridays. Recheck 6 weeks

## 2024-04-06 DIAGNOSIS — M47816 Spondylosis without myelopathy or radiculopathy, lumbar region: Secondary | ICD-10-CM | POA: Diagnosis not present

## 2024-04-06 DIAGNOSIS — Z131 Encounter for screening for diabetes mellitus: Secondary | ICD-10-CM | POA: Diagnosis not present

## 2024-04-06 DIAGNOSIS — N1831 Chronic kidney disease, stage 3a: Secondary | ICD-10-CM | POA: Diagnosis not present

## 2024-04-06 DIAGNOSIS — Z79899 Other long term (current) drug therapy: Secondary | ICD-10-CM | POA: Diagnosis not present

## 2024-04-06 DIAGNOSIS — E7800 Pure hypercholesterolemia, unspecified: Secondary | ICD-10-CM | POA: Diagnosis not present

## 2024-04-06 DIAGNOSIS — Z6835 Body mass index (BMI) 35.0-35.9, adult: Secondary | ICD-10-CM | POA: Diagnosis not present

## 2024-04-06 DIAGNOSIS — M1711 Unilateral primary osteoarthritis, right knee: Secondary | ICD-10-CM | POA: Diagnosis not present

## 2024-04-06 DIAGNOSIS — I10 Essential (primary) hypertension: Secondary | ICD-10-CM | POA: Diagnosis not present

## 2024-04-06 DIAGNOSIS — R7401 Elevation of levels of liver transaminase levels: Secondary | ICD-10-CM | POA: Diagnosis not present

## 2024-04-06 DIAGNOSIS — E6609 Other obesity due to excess calories: Secondary | ICD-10-CM | POA: Diagnosis not present

## 2024-04-06 DIAGNOSIS — G894 Chronic pain syndrome: Secondary | ICD-10-CM | POA: Diagnosis not present

## 2024-04-07 ENCOUNTER — Encounter: Payer: Self-pay | Admitting: Pharmacist

## 2024-04-08 ENCOUNTER — Other Ambulatory Visit (HOSPITAL_COMMUNITY): Payer: PPO

## 2024-04-12 NOTE — Progress Notes (Signed)
 Patient ID: [name] [name]                 DOB: 07/08/1961                    MRN: [id number]      HPI: [name] [name] is a 63 y.o. female patient referred to lipid clinic by Dr. Shirlee Latch. She has also been referred to PharmD clinic for initiation of a GLP-1. PMH is significant for CAD with MI in 2008 w/ no intervention (s/p PCI in 2022), Afib, carotid stenosis, anti-phospholipid antibody syndrome, CVA, HFrEF, PAD (ABI 0.75 L foot, 1 R foot), ventricular tachycardia, and obesity.  Baseline weight and BMI: 211 lbs, BMI 35.1 Current meds that affect weight: none  Patient has a documented history of statin intolerance with myalgias to atorvastatin and rosuvastatin, even with less-frequent dosing. She is only on Zetia for HLD. Last FLP on 02/23/24 shows LDL 78, HDL 54, TG 52, TC 119.  At last cardiology visit 02/23/24, patient has gained 8 lbs in one month that is not related to volume overload or her CHF. She expressed interest in a GLP-1 RA and presents today for discussion about mechanism of action, dosing and administration, and expected side effects.   Reviewed options for lowering LDL cholesterol, including ezetimibe, PCSK-9 inhibitors, bempedoic acid and inclisiran.  Discussed mechanisms of action, dosing, side effects and potential decreases in LDL cholesterol.  Also reviewed cost information and potential options for patient assistance. With patient's insurance, Repatha is $47/month or $117.50/90 day supply.  Current Medications:  Intolerances: atorvastatin 10 mg QD --> QOD (myalgia), rosuvastatin 10 mg twice weekly (myalgia)  Risk Factors: previous MI, CVA, HFrEF, PAD w/ ABI < 0.85 LDL-C goal: < 55 mg/dl  ApoB goal: < 70 mg/dl  Diet:   Exercise:   Family History:  Mother (Deceased) Uterine cancer    Father (Deceased) Heart disease (Age: 34)  Hyperlipidemia  Hypertension    Sister Metallurgist) Ankylosing spondylitis  Heart disease  Hypertension    Brother (Deceased) Heart attack   Stomach cancer (Age: 67)     Social History: The patient reports that she quit smoking about 3 years ago. Her smoking use included cigarettes and e-cigarettes. She started smoking about 45 years ago, 10.8 pack-year smoking history. She uses smokeless tobacco. She reports that she does not drink alcohol. Prior cocaine use.   Labs: Lipid Panel     Component Value Date/Time   CHOL 142 02/23/2024 1634   CHOL 173 10/09/2021 1602   TRIG 52 02/23/2024 1634   HDL 54 02/23/2024 1634   HDL 44 10/09/2021 1602   CHOLHDL 2.6 02/23/2024 1634   VLDL 10 02/23/2024 1634   LDLCALC 78 02/23/2024 1634   LDLCALC 104 (H) 10/09/2021 1602   LABVLDL 25 10/09/2021 1602    Past Medical History:  Diagnosis Date   Aneurysm of internal carotid artery    Anti-phospholipid syndrome (HCC)    Anticardiolipin antibody positive    Chronic Coumadin   Brain aneurysm    followed by Dr. Corliss Skains   Chronic combined systolic (congestive) and diastolic (congestive) heart failure (HCC)    Cocaine abuse in remission [facility])    Coronary atherosclerosis of native coronary artery    Previous invasive cardiac testing was done at a facility in [place], [place].  Occluded diagonal, Diffuse LAD disease, Occluded Cx, and non obstructive RCA.    Essential hypertension    Headache    History of pneumonia  ICD (implantable cardioverter-defibrillator) in place    Ischemic cardiomyopathy    LVEF 30-35%   Mixed hyperlipidemia    NSVT (nonsustained ventricular tachycardia) (HCC)    Persistent atrial fibrillation (HCC)    PVD (peripheral vascular disease) (HCC)    Raynaud's phenomenon    Statin intolerance    Stroke [facility]) 2007   Total occlusion of the right internal carotid    Current Outpatient Medications on File Prior to Visit  Medication Sig Dispense Refill   amiodarone (PACERONE) 200 MG tablet TAKE 1 AND 1/2 TABLETS BY MOUTH MONDAY-THURSDAY. TAKE 2 TABLETS BY MOUTH FRIDAY-SUNDAY 144 tablet 3   carvedilol (COREG)  12.5 MG tablet TAKE 1 TABLET BY MOUTH TWICE DAILY WITH A MEAL (NEED FOLLOWING UP APPOINTMENT FOR MORE REFILLS) 30 tablet 4   DULoxetine (CYMBALTA) 60 MG capsule TAKE ONE CAPSULE BY MOUTH DAILY 30 capsule 2   empagliflozin (JARDIANCE) 10 MG TABS tablet Take 1 tablet (10 mg total) by mouth daily before breakfast. 90 tablet 3   EPINEPHRINE 0.3 mg/0.3 mL IJ SOAJ injection USE AS DIRECTED 2 each 2   ezetimibe (ZETIA) 10 MG tablet TAKE 1 TABLET BY MOUTH EVERY DAY (patient needs office visit FOR further refills) 30 tablet 1   fluticasone (FLONASE) 50 MCG/ACT nasal spray Place 1 spray into both nostrils as needed for allergies or rhinitis.     isosorbide mononitrate (IMDUR) 30 MG 24 hr tablet Take 1 tablet (30 mg total) by mouth 2 (two) times daily. 180 tablet 2   mexiletine (MEXITIL) 150 MG capsule TAKE TWO CAPSULES BY MOUTH THREE TIMES DAILY 540 capsule 3   Multiple Vitamins-Minerals (MULTIVITAMINS THER. W/MINERALS) TABS Take 1 tablet by mouth daily.       nitroGLYCERIN (NITROSTAT) 0.4 MG SL tablet PLACE 1 TABLET UNDER THE TONGUE EVERY 5 MINUTES FOR 3 DOSES AS NEEDED CHEST PAIN 25 tablet 3   oxyCODONE-acetaminophen (PERCOCET) 10-325 MG tablet Take 1 tablet by mouth every 4 (four) hours as needed for pain.     pantoprazole (PROTONIX) 40 MG tablet TAKE 1 TABLET BY MOUTH DAILY 30 tablet 11   potassium chloride SA (KLOR-CON M) 20 MEQ tablet TAKE 2 TABLETS BY MOUTH EVERY MORNING and TAKE 1 TABLET EVERY EVENING 270 tablet 1   pravastatin (PRAVACHOL) 20 MG tablet Take 20 mg by mouth 3 (three) times a week.     promethazine (PHENERGAN) 25 MG tablet TAKE 1 TABLET BY MOUTH EVERY 8 HOURS AS NEEDED 45 tablet 2   sacubitril-valsartan (ENTRESTO) 24-26 MG Take 1 tablet by mouth 2 (two) times daily. 180 tablet 3   sennosides-docusate sodium (SENOKOT-S) 8.6-50 MG tablet Take 1 tablet by mouth daily as needed.     spironolactone (ALDACTONE) 25 MG tablet TAKE 1 TABLET BY MOUTH ONCE DAILY 30 tablet 3   torsemide (DEMADEX)  20 MG tablet Take 2 tablets (40 mg total) by mouth 2 (two) times daily. 180 tablet 3   traZODone (DESYREL) 100 MG tablet Take 1 tablet (100 mg total) by mouth at bedtime as needed for sleep. (Patient not taking: Reported on 03/11/2024) 90 tablet 1   Vericiguat (VERQUVO) 5 MG TABS TAKE 1 TABLET BY MOUTH ONCE DAILY 30 tablet 6   warfarin (COUMADIN) 5 MG tablet TAKE ONE-HALF TO 1 TABLET BY MOUTH EVERY DAY OR USE AS DIRECTED by coumadin clinic 30 tablet 5   No current facility-administered medications on file prior to visit.    Allergies  Allergen Reactions   Beef-Derived Drug Products Anaphylaxis  From tick bite   Metoclopramide Hcl Anaphylaxis    Non responsive   Penicillins Anaphylaxis    Shock Has patient had a PCN reaction causing immediate rash, facial/tongue/throat swelling, SOB or lightheadedness with hypotension: Yes Has patient had a PCN reaction causing severe rash involving mucus membranes or skin necrosis: No Has patient had a PCN reaction that required hospitalization Yes Has patient had a PCN reaction occurring within the last 10 years: No If all of the above answers are "NO", then may proceed with Cephalosporin    Pork-Derived Products Anaphylaxis    From tick bite. Has tolerated heparin/lovenox   Metoprolol Other (See Comments)    Syncope    Statins Other (See Comments)    Myalgias    Assessment/Plan:  1. Hyperlipidemia -  No problem-specific Assessment & Plan notes found for this encounter.    Thank you,   D , Pharm.D, BCACP, CPP [facility] [address], [place], [place] [address]  Phone: [phone]; Fax: [phone]

## 2024-04-13 ENCOUNTER — Telehealth: Payer: Self-pay

## 2024-04-13 NOTE — Telephone Encounter (Signed)
 Alert remote transmission:  Antitachycardia pacing (ATP) therapy delivered to convert arrhythmia. Event occurred 4/14 @ 16:27, duration 26sec, HR 135.  Event triggered with PVC, pace terminated with 1 burst of ATP Route to triage high alert per protocol Follow up as scheduled. LA, CVRS  Pt of Dr. Carolynne Citron and Dr. Mitzie Anda. Has appointment at [facility] at 230 tomorrow w/ Pharmacist. Stann Earnest is DoD tomorrow. Added on DoD schedule @ 145 at [facility]. ED precautions given. Driving restrictions x 6 months advised.  She felt fine around the time of the ATP. Was not doing anything strenuous. She knew when she went into VT. Symptoms described as palps, and tingling throughout body. No syncope, CP, SOB.  Compliant w/ cardiac meds:  - Amio 300 mg Monday-Thursday and 400mg  Friday-Sunday.  - Coreg 12.5mg  BID - Mexitil 300mg  TID - Spiro 25mg  daily - Torsemide 40 mg BID - Klor con 40 mg q AM and 20mg  at bedtime - Warfarin 7.5mg  daily - Jardiance 10mg  daily q AM - Zetia 10mg  daily - Pravastatin 20mg  three times weekly.  - isosorbide 30mg  BID.

## 2024-04-14 ENCOUNTER — Ambulatory Visit: Payer: PPO | Attending: Cardiovascular Disease | Admitting: Pharmacist

## 2024-04-14 DIAGNOSIS — E785 Hyperlipidemia, unspecified: Secondary | ICD-10-CM

## 2024-04-14 DIAGNOSIS — R7989 Other specified abnormal findings of blood chemistry: Secondary | ICD-10-CM | POA: Diagnosis not present

## 2024-04-14 DIAGNOSIS — I679 Cerebrovascular disease, unspecified: Secondary | ICD-10-CM

## 2024-04-14 NOTE — Assessment & Plan Note (Signed)
 Assessment:  Patient reports adherence to current regimen of pravastatin 20 mg three times per week and ezetimibe with no side effects. She is on the maximally tolerated dose of pravastatin with history of myalgias when she took it 5 times weekly.  LDL 78 at not at goal of LDL < 55 mg/dl, high ASCVD burden with hx MI, stroke, and PAD with ABI < 0.85 Candidate for intensified lipid-lowering with PCSK9 inhibitor.  Plan:  Initiate Repatha today. PA and [employer] grant application in process. Once approved, patient will receive Repatha at no cost.  Recommended patient add small weights to chair exercises for strength training and to keep up the good work with dietary changes. Congratulated patient on her progress in tobacco cessation so far.  Reviewed proper injection technique of Repatha and educated on importance of continuing statin/ezetimibe even with PCSK9. Repeat labs in 3 months

## 2024-04-14 NOTE — Patient Instructions (Addendum)
 START Wegovy 0.25 mg - inject one pen into the skin once weekly.  I will submit a prior authorization for Repatha. I will call you once I hear back. Please call me at [phone] with any questions.   Repatha is a cholesterol medication that improved your body's ability to get rid of "bad cholesterol" known as LDL. It can lower your LDL up to 60%! It is an injection that is given under the skin every 2 weeks. The medication often requires a prior authorization from your insurance company. We will take care of submitting all the necessary information to your insurance company to get it approved. The most common side effects of Repatha include runny nose, symptoms of the common cold, rarely flu or flu-like symptoms, back/muscle pain in about 3-4% of the patients, and redness, pain, or bruising at the injection site. Tell your healthcare provider if you have any side effect that bothers you or that does not go away.     GLP-1 Receptor Agonist Counseling Points This medication reduces your appetite and may make you feel fuller longer.  Stop eating when your body tells you that you are full. This will likely happen sooner than you are used to. Fried/greasy food and sweets may upset your stomach - minimize these as much as possible. Store your medication in the fridge until you are ready to use it. Inject your medication in the fatty tissue of your lower abdominal area (2 inches away from belly button) or upper outer thigh. Rotate injection sites. Common side effects include: nausea, diarrhea/constipation, and heartburn, and are more likely to occur if you overeat. Stop your injection for 7 days prior to surgical procedures requiring anesthesia.  Dosing schedule:  We will touch base with you monthly over the phone. The medication can be increased in monthly intervals depending on tolerability and efficacy.  Tips for success: Write down the reasons why you want to lose weight and post it in a place  where you'll see it often.  Start small and work your way up. Keep in mind that it takes time to achieve goals, and small steps add up.  Any additional movements help to burn calories. Taking the stairs rather than the elevator and parking at the far end of your parking lot are easy ways to start. Brisk walking for at least 30 minutes 4 or more days of the week is an excellent goal to work toward  Understanding what it means to feel full: Did you know that it can take 15 minutes or more for your brain to receive the message that you've eaten? That means that, if you eat less food, but consume it slower, you may still feel satisfied.  Eating a lot of fruits and vegetables can also help you feel fuller.  Eat off of smaller plates so that moderate portions don't seem too small  Tips for living a healthier life     Building a Healthy and Balanced Diet Make most of your meal vegetables and fruits -  of your plate. Aim for color and variety, and remember that potatoes don't count as vegetables on the Healthy Eating Plate because of their negative impact on blood sugar.  Go for whole grains -  of your plate. Whole and intact grains--whole wheat, barley, wheat berries, quinoa, oats, brown rice, and foods made with them, such as whole wheat pasta--have a milder effect on blood sugar and insulin than white bread, white rice, and other refined grains.  Protein power -  of your plate. Fish, poultry, beans, and nuts are all healthy, versatile protein sources--they can be mixed into salads, and pair well with vegetables on a plate. Limit red meat, and avoid processed meats such as bacon and sausage.  Healthy plant oils - in moderation. Choose healthy vegetable oils like olive, canola, soy, corn, sunflower, peanut, and others, and avoid partially hydrogenated oils, which contain unhealthy trans fats. Remember that low-fat does not mean "healthy."  Drink water, coffee, or tea. Skip sugary drinks,  limit milk and dairy products to one to two servings per day, and limit juice to a small glass per day.  Stay active. The red figure running across the Healthy Eating Plate's placemat is a reminder that staying active is also important in weight control.  The main message of the Healthy Eating Plate is to focus on diet quality:  The type of carbohydrate in the diet is more important than the amount of carbohydrate in the diet, because some sources of carbohydrate--like vegetables (other than potatoes), fruits, whole grains, and beans--are healthier than others. The Healthy Eating Plate also advises consumers to avoid sugary beverages, a major source of calories--usually with little nutritional value--in the American diet. The Healthy Eating Plate encourages consumers to use healthy oils, and it does not set a maximum on the percentage of calories people should get each day from healthy sources of fat. In this way, the Healthy Eating Plate recommends the opposite of the low-fat message promoted for decades by the USDA.  [employer]  SUGAR  Sugar is a huge problem in the modern day diet. Sugar is a big contributor to heart disease, diabetes, high triglyceride levels, fatty liver disease and obesity. Sugar is hidden in almost all packaged foods/beverages. Added sugar is extra sugar that is added beyond what is naturally found and has no nutritional benefit for your body. The American Heart Association recommends limiting added sugars to no more than 25g for women and 36 grams for men per day. There are many names for sugar including maltose, sucrose (names ending in "ose"), high fructose corn syrup, molasses, cane sugar, corn sweetener, raw sugar, syrup, honey or fruit juice concentrate.   One of the best ways to limit your added sugars is to stop drinking sweetened beverages such as soda, sweet tea, and fruit juice.  There is 65g of added sugars  in one 20oz bottle of Coke! That is equal to 7.5 donuts.   Pay attention and read all nutrition facts labels. Below is an examples of a nutrition facts label. The #1 is showing you the total sugars where the # 2 is showing you the added sugars. This one serving has almost the max amount of added sugars per day!   EXERCISE  Exercise is good. We've all heard that. In an ideal world, we would all have time and resources to get plenty of it. When you are active, your heart pumps more efficiently and you will feel better.  Multiple studies show that even walking regularly has benefits that include living a longer life. The American Heart Association recommends 150 minutes per week of exercise (30 minutes per day most days of the week). You can do this in any increment you wish. Nine or more 10-minute walks count. So does an hour-long exercise class. Break the time apart into what will work in your life. Some of the best things you can do include walking briskly, jogging, cycling or swimming laps. Not everyone is ready to "exercise." Sometimes we  need to start with just getting active. Here are some easy ways to be more active throughout the day:  Take the stairs instead of the elevator  Go for a 10-15 minute walk during your lunch break (find a friend to make it more enjoyable)  When shopping, park at the back of the parking lot  If you take public transportation, get off one stop early and walk the extra distance  Pace around while making phone calls  Check with your doctor if you aren't sure what your limitations may be. Always remember to drink plenty of water when doing any type of exercise. Don't feel like a failure if you're not getting the 90-150 minutes per week. If you started by being a couch potato, then just a 10-minute walk each day is a huge improvement. Start with little victories and work your way up.   HEALTHY EATING TIPS              Plan ahead: make a menu of the meals for a week then  create a grocery list to go with that menu. Consider meals that easily stretch into a night of leftovers, such as stews or casseroles. Or consider making two of your favorite meal and put one in the freezer for another night. Try a night or two each week that is "meatless" or "no cook" such as salads. When you get home from the grocery store wash and prepare your vegetables and fruits. Then when you need them they are ready to go.   Tips for going to the grocery store:  Buy store or generic brands  Check the weekly ad from your store on-line or in their in-store flyer  Look at the unit price on the shelf tag to compare/contrast the costs of different items  Buy fruits/vegetables in season  Carrots, bananas and apples are low-cost, naturally healthy items  If meats or frozen vegetables are on sale, buy some extras and put in your freezer  Limit buying prepared or "ready to eat" items, even if they are pre-made salads or fruit snacks  Do not shop when you're hungry  Foods at eye level tend to be more expensive. Look on the high and low shelves for deals.  Consider shopping at the farmer's market for fresh foods in season.  Avoid the cookie and chip aisles (these are expensive, high in calories and low in nutritional value). Shop on the outside of the grocery store.  Healthy food preparations:  If you can't get lean hamburger, be sure to drain the fat when cooking  Steam, saut (in olive oil), grill or bake foods  Experiment with different seasonings to avoid adding salt to your foods. Kosher salt, sea salt and Himalayan salt are all still salt and should be avoided. Try seasoning food with onion, garlic, thyme, rosemary, basil ect. Onion powder or garlic powder is ok. Avoid if it says salt (ie garlic salt).

## 2024-04-14 NOTE — Telephone Encounter (Signed)
 Patient was not seen by Dr. Stann Earnest DOD spot. Patient has labs to be drawn today per pharmacy and will forward to Dr. Carolynne Citron to review. Patient does not drive and advised no driving x6 months per [place] DMV.

## 2024-04-15 ENCOUNTER — Other Ambulatory Visit (HOSPITAL_COMMUNITY): Payer: Self-pay

## 2024-04-15 ENCOUNTER — Telehealth: Payer: Self-pay | Admitting: Pharmacy Technician

## 2024-04-15 ENCOUNTER — Telehealth: Payer: Self-pay | Admitting: Pharmacist

## 2024-04-15 MED ORDER — REPATHA SURECLICK 140 MG/ML ~~LOC~~ SOAJ: 140.0000 mg | SUBCUTANEOUS | 3 refills | Status: DC

## 2024-04-15 NOTE — Telephone Encounter (Signed)
 Pharmacy Patient Advocate Encounter   Received notification from Pt Calls Messages that prior authorization for repatha is required/requested.   Insurance verification completed.   The patient is insured through [facility] ADVANTAGE/RX ADVANCE .   Per test claim: PA required; PA submitted to above mentioned insurance via CoverMyMeds Key/confirmation #/EOC [id number] Status is pending

## 2024-04-15 NOTE — Telephone Encounter (Signed)
 Pharmacy Patient Advocate Encounter  Received notification from [employer] that Prior Authorization for wegovy has been APPROVED from 04/15/24 to 12/29/24. Ran test claim, Copay is $0.00. This test claim was processed through [facility]- copay amounts may vary at other pharmacies due to pharmacy/plan contracts, or as the patient moves through the different stages of their insurance plan.

## 2024-04-15 NOTE — Addendum Note (Signed)
 Addended by: Besan Ketchem K on: 04/15/2024 11:41 AM   Modules accepted: Orders

## 2024-04-15 NOTE — Telephone Encounter (Signed)
 Pharmacy Patient Advocate Encounter  Received notification from [employer] that Prior Authorization for repatha has been APPROVED from 04/15/24 to 10/12/24. Ran test claim, Copay is $0.00-one month. This test claim was processed through [facility]- copay amounts may vary at other pharmacies due to pharmacy/plan contracts, or as the patient moves through the different stages of their insurance plan.   PA #/Case ID/Reference #: [id number]

## 2024-04-15 NOTE — Telephone Encounter (Signed)
 Patient informed about Repatha PA approval. Prescription sent to [facility] per patient's request. Will call back when we find about  about [facility] coverage.

## 2024-04-15 NOTE — Telephone Encounter (Signed)
 Pharmacy Patient Advocate Encounter   Received notification from Pt Calls Messages that prior authorization for wegovy is required/requested.   Insurance verification completed.   The patient is insured through [facility] ADVANTAGE/RX ADVANCE .   Per test claim: PA required; PA submitted to above mentioned insurance via CoverMyMeds Key/confirmation #/EOC [id number] Status is pending

## 2024-04-19 ENCOUNTER — Ambulatory Visit: Attending: Cardiology

## 2024-04-19 DIAGNOSIS — I739 Peripheral vascular disease, unspecified: Secondary | ICD-10-CM | POA: Diagnosis not present

## 2024-04-20 ENCOUNTER — Other Ambulatory Visit: Payer: Self-pay | Admitting: Family

## 2024-04-20 DIAGNOSIS — R110 Nausea: Secondary | ICD-10-CM

## 2024-04-20 LAB — VAS US ABI WITH/WO TBI
Left ABI: 0.87
Right ABI: 1.13

## 2024-04-20 MED ORDER — WEGOVY 0.25 MG/0.5ML ~~LOC~~ SOAJ: 0.2500 mg | SUBCUTANEOUS | 0 refills | Status: DC

## 2024-04-20 NOTE — Addendum Note (Signed)
 Addended by: Marshe Shrestha D on: 04/20/2024 11:11 AM   Modules accepted: Orders

## 2024-04-20 NOTE — Telephone Encounter (Signed)
 Pt made aware of approval. Rx sent. She will start on Thursday. Follow up in 3-4 weeks

## 2024-04-21 ENCOUNTER — Telehealth (HOSPITAL_COMMUNITY): Payer: Self-pay

## 2024-04-21 NOTE — Telephone Encounter (Addendum)
 Spoke to patient and she is aware of results.  ----- Message from Peder Bourdon sent at 04/20/2024  3:47 PM EDT ----- Mild PAD.  Would manage medically.

## 2024-04-23 NOTE — Telephone Encounter (Signed)
 PA approved see other encounter for more info

## 2024-04-26 NOTE — Addendum Note (Signed)
 Addended by: Lott Rouleau A on: 04/26/2024 12:56 PM   Modules accepted: Orders

## 2024-04-26 NOTE — Progress Notes (Signed)
 Remote ICD transmission.

## 2024-05-03 ENCOUNTER — Emergency Department (HOSPITAL_COMMUNITY)

## 2024-05-03 ENCOUNTER — Emergency Department (HOSPITAL_COMMUNITY)
Admission: EM | Admit: 2024-05-03 | Discharge: 2024-05-03 | Disposition: A | Discharge status: Home / Self Care | Attending: Emergency Medicine | Admitting: Emergency Medicine

## 2024-05-03 ENCOUNTER — Other Ambulatory Visit: Payer: Self-pay

## 2024-05-03 ENCOUNTER — Encounter (HOSPITAL_COMMUNITY): Payer: Self-pay | Admitting: Emergency Medicine

## 2024-05-03 DIAGNOSIS — M7989 Other specified soft tissue disorders: Secondary | ICD-10-CM | POA: Diagnosis not present

## 2024-05-03 DIAGNOSIS — R791 Abnormal coagulation profile: Secondary | ICD-10-CM | POA: Diagnosis not present

## 2024-05-03 DIAGNOSIS — W06XXXA Fall from bed, initial encounter: Secondary | ICD-10-CM | POA: Insufficient documentation

## 2024-05-03 DIAGNOSIS — S62102A Fracture of unspecified carpal bone, left wrist, initial encounter for closed fracture: Secondary | ICD-10-CM | POA: Insufficient documentation

## 2024-05-03 DIAGNOSIS — M8580 Other specified disorders of bone density and structure, unspecified site: Secondary | ICD-10-CM | POA: Insufficient documentation

## 2024-05-03 DIAGNOSIS — S6292XD Unspecified fracture of left wrist and hand, subsequent encounter for fracture with routine healing: Secondary | ICD-10-CM | POA: Diagnosis not present

## 2024-05-03 DIAGNOSIS — S52612D Displaced fracture of left ulna styloid process, subsequent encounter for closed fracture with routine healing: Secondary | ICD-10-CM | POA: Diagnosis not present

## 2024-05-03 DIAGNOSIS — M25532 Pain in left wrist: Secondary | ICD-10-CM | POA: Diagnosis not present

## 2024-05-03 DIAGNOSIS — R1900 Intra-abdominal and pelvic swelling, mass and lump, unspecified site: Secondary | ICD-10-CM | POA: Insufficient documentation

## 2024-05-03 DIAGNOSIS — S52352A Displaced comminuted fracture of shaft of radius, left arm, initial encounter for closed fracture: Secondary | ICD-10-CM | POA: Diagnosis not present

## 2024-05-03 DIAGNOSIS — S52602A Unspecified fracture of lower end of left ulna, initial encounter for closed fracture: Secondary | ICD-10-CM | POA: Diagnosis not present

## 2024-05-03 DIAGNOSIS — N201 Calculus of ureter: Secondary | ICD-10-CM | POA: Diagnosis not present

## 2024-05-03 DIAGNOSIS — S3993XA Unspecified injury of pelvis, initial encounter: Secondary | ICD-10-CM | POA: Diagnosis not present

## 2024-05-03 DIAGNOSIS — S59002D Unspecified physeal fracture of lower end of ulna, left arm, subsequent encounter for fracture with routine healing: Secondary | ICD-10-CM | POA: Diagnosis not present

## 2024-05-03 DIAGNOSIS — S52502A Unspecified fracture of the lower end of left radius, initial encounter for closed fracture: Secondary | ICD-10-CM | POA: Diagnosis not present

## 2024-05-03 DIAGNOSIS — S52572D Other intraarticular fracture of lower end of left radius, subsequent encounter for closed fracture with routine healing: Secondary | ICD-10-CM | POA: Diagnosis not present

## 2024-05-03 DIAGNOSIS — M25552 Pain in left hip: Secondary | ICD-10-CM | POA: Diagnosis not present

## 2024-05-03 DIAGNOSIS — S52252A Displaced comminuted fracture of shaft of ulna, left arm, initial encounter for closed fracture: Secondary | ICD-10-CM | POA: Diagnosis not present

## 2024-05-03 LAB — CBC WITH DIFFERENTIAL/PLATELET
Abs Immature Granulocytes: 0.05 10*3/uL (ref 0.00–0.07)
Basophils Absolute: 0.1 10*3/uL (ref 0.0–0.1)
Basophils Relative: 1 %
Eosinophils Absolute: 0.1 10*3/uL (ref 0.0–0.5)
Eosinophils Relative: 1 %
HCT: 46.8 % — ABNORMAL HIGH (ref 36.0–46.0)
Hemoglobin: 15.3 g/dL — ABNORMAL HIGH (ref 12.0–15.0)
Immature Granulocytes: 0 %
Lymphocytes Relative: 10 %
Lymphs Abs: 1.3 10*3/uL (ref 0.7–4.0)
MCH: 30.4 pg (ref 26.0–34.0)
MCHC: 32.7 g/dL (ref 30.0–36.0)
MCV: 92.9 fL (ref 80.0–100.0)
Monocytes Absolute: 0.8 10*3/uL (ref 0.1–1.0)
Monocytes Relative: 6 %
Neutro Abs: 10 10*3/uL — ABNORMAL HIGH (ref 1.7–7.7)
Neutrophils Relative %: 82 %
Platelets: 184 10*3/uL (ref 150–400)
RBC: 5.04 MIL/uL (ref 3.87–5.11)
RDW: 13.5 % (ref 11.5–15.5)
WBC: 12.2 10*3/uL — ABNORMAL HIGH (ref 4.0–10.5)
nRBC: 0 % (ref 0.0–0.2)

## 2024-05-03 LAB — BASIC METABOLIC PANEL WITH GFR
Anion gap: 13 (ref 5–15)
BUN: 19 mg/dL (ref 8–23)
CO2: 25 mmol/L (ref 22–32)
Calcium: 8.7 mg/dL — ABNORMAL LOW (ref 8.9–10.3)
Chloride: 95 mmol/L — ABNORMAL LOW (ref 98–111)
Creatinine, Ser: 1.43 mg/dL — ABNORMAL HIGH (ref 0.44–1.00)
GFR, Estimated: 42 mL/min — ABNORMAL LOW (ref >60)
Glucose, Bld: 123 mg/dL — ABNORMAL HIGH (ref 70–99)
Potassium: 3.7 mmol/L (ref 3.5–5.1)
Sodium: 133 mmol/L — ABNORMAL LOW (ref 135–145)

## 2024-05-03 LAB — PROTIME-INR
INR: 3.3 — ABNORMAL HIGH (ref 0.8–1.2)
Prothrombin Time: 34.1 s — ABNORMAL HIGH (ref 11.4–15.2)

## 2024-05-03 MED ORDER — LIDOCAINE HCL (PF) 2 % IJ SOLN
10.0000 mL | Freq: Once | INTRAMUSCULAR | Status: AC
  Administered 2024-05-03: 10 mL

## 2024-05-03 MED ORDER — OXYCODONE-ACETAMINOPHEN 5-325 MG PO TABS
2.0000 | ORAL_TABLET | Freq: Once | ORAL | Status: AC
  Administered 2024-05-03: 2 via ORAL
  Filled 2024-05-03: qty 2

## 2024-05-03 MED ORDER — LIDOCAINE HCL (PF) 2 % IJ SOLN
INTRAMUSCULAR | Status: AC
  Filled 2024-05-03: qty 10

## 2024-05-03 NOTE — ED Notes (Signed)
 Ice applied

## 2024-05-03 NOTE — Discharge Instructions (Signed)
 I was not able to get your wrist back in anatomic position but it is improved. I suggest you follow up with your orthopedic practice within the week for further management and surgical consideration.

## 2024-05-03 NOTE — ED Triage Notes (Addendum)
 Pt bib EMS after she fell at home landing on L wrist. L wrist deformity noted by EMS. Pt denies any loc. Pt on coumadin . Pt also reports that she hit her L outer thigh on the corner of her dogs' step and "face planted into her recliner". Pt has swelling and abrasion to lower lip.

## 2024-05-04 ENCOUNTER — Telehealth: Payer: Self-pay | Admitting: *Deleted

## 2024-05-04 ENCOUNTER — Telehealth: Payer: Self-pay

## 2024-05-04 ENCOUNTER — Encounter: Payer: Self-pay | Admitting: Internal Medicine

## 2024-05-04 ENCOUNTER — Encounter: Payer: Self-pay | Admitting: Cardiology

## 2024-05-04 MED ORDER — ENOXAPARIN SODIUM 150 MG/ML IJ SOSY: 150.0000 mg | PREFILLED_SYRINGE | INTRAMUSCULAR | 0 refills | Status: DC

## 2024-05-04 NOTE — Telephone Encounter (Addendum)
   Primary Cardiologist: Teddie Favre, MD  Called patient to ensure no concerning cardiac symptoms.   Chart reviewed as part of pre-operative protocol coverage. Given past medical history and time since last visit, based on ACC/AHA guidelines, [name] [name] would be at acceptable risk for the planned procedure without further cardiovascular testing.   She is limited by balance issues but remains active with chair exercises and house keeping. She has to use a walker at times due to instability. She is able to achieve > 4 METS activity without concerning cardiac symptoms.   Patient was advised that if she develops new symptoms prior to surgery to contact our office to arrange a follow-up appointment.  She verbalized understanding.  She reports she has an appointment with [facility] clinic in [place] on 05/05/24 for Lovenox  bridging instructions.   I will route this recommendation to the requesting party via Epic fax function and remove from pre-op pool.  Please call with questions.  Gerldine Koch, NP-C  05/04/2024, 3:58 PM [address], [address] [place], [place] [address] Office [phone] Fax [phone]

## 2024-05-04 NOTE — Telephone Encounter (Signed)
   Pre-operative Risk Assessment    Patient Name: [name] [name]  DOB: 04/18/1961 MRN: [id number]   Date of last office visit: 01/28/24 Date of next office visit: n/a   Request for Surgical Clearance    Procedure:   Left wrist open reduction internal fixation   Date of Surgery:  Clearance 05/07/24                                 Surgeon:  Dr. Ltanya Rummer  Surgeon's Group or Practice Name:  [facility]  Phone number:  [phone] Fax number:  [phone]   Type of Clearance Requested:   - Medical  - Pharmacy:  Hold Warfarin (Coumadin ) Not indicated    Type of Anesthesia:   Block and MAC anesthesia    Additional requests/questions:    [name]   05/04/2024, 11:13 AM

## 2024-05-04 NOTE — Telephone Encounter (Signed)
 Pt is pending surgery for broken wrist on 05/07/24.  Pt has been cleared to hold warfarin 5 days before procedure and bridge with Lovenox .  Called and spoke to patient.  Told her to hold warfarin tonight.  She has INR appt tomorrow.  Will bridge with Lovenox  at that time.  She requested Rx for Lovenox  to go ahead and be sent to [facility].  Lovenox  150mg  daily #10 sent to drug store.  Pt has a history of Alpha gal but has not had issues taking Lovenox  or Heparin  in the past.

## 2024-05-04 NOTE — Telephone Encounter (Signed)
 Patient with diagnosis of atrial fibrillation and antiphospholipid syndrome on warfarin for anticoagulation.    Procedure:   Left wrist open reduction internal fixation    Date of Surgery:  Clearance 05/07/24   CHA2DS2-VASc Score = 5   This indicates a 7.2% annual risk of stroke. The patient's score is based upon: CHF History: 1 HTN History: 1 Diabetes History: 0 Stroke History: 2 Vascular Disease History: 0 Age Score: 0 Gender Score: 1    CrCl 63 Platelet count 184  Patient has not had an Afib/aflutter ablation within the last 3 months or DCCV within the last 30 days  Per office protocol, patient can hold warfarin for 5 days prior to procedure.   Patient WILL need bridging with Lovenox  (enoxaparin ) around procedure.  Message sent to [facility].  They will reach out to patient to make arrangements.    **This guidance is not considered finalized until pre-operative APP has relayed final recommendations.**

## 2024-05-04 NOTE — ED Provider Notes (Addendum)
 [place] EMERGENCY DEPARTMENT AT [facility] Provider Note   CSN: [id number] Arrival date & time: 05/03/24  0231     History  Chief Complaint  Patient presents with   Fall   Wrist Injury    [name] [name] is a 62 y.o. female.  63 yo F who tripped over a dog bed and landed on her left side and then left hand causing deformity, pain and ecchymosis to left wrist but also swelling left lateral thigh, left pubic area. On coumadin . No other injuries. No head injury, syncope.   Fall  Wrist Injury      Home Medications Prior to Admission medications   Medication Sig Start Date End Date Taking? Authorizing Provider  amiodarone  (PACERONE ) 200 MG tablet TAKE 1 AND 1/2 TABLETS BY MOUTH MONDAY-THURSDAY. TAKE 2 TABLETS BY MOUTH FRIDAY-SUNDAY 11/10/23   Lasalle Pointer, NP  carvedilol  (COREG ) 12.5 MG tablet TAKE 1 TABLET BY MOUTH TWICE DAILY WITH A MEAL (NEED FOLLOWING UP APPOINTMENT FOR MORE REFILLS) 03/10/24   Darlis Eisenmenger, MD  DULoxetine  (CYMBALTA ) 60 MG capsule TAKE ONE CAPSULE BY MOUTH DAILY 03/08/24   Tommas Fragmin A, FNP  empagliflozin  (JARDIANCE ) 10 MG TABS tablet Take 1 tablet (10 mg total) by mouth daily before breakfast. 06/30/23   Gerard Knight, MD  EPINEPHRINE 0.3 mg/0.3 mL IJ SOAJ injection USE AS DIRECTED 05/02/22   Zorita Hiss, FNP  Evolocumab  (REPATHA  SURECLICK) 140 MG/ML SOAJ Inject 140 mg into the skin every 14 (fourteen) days. 04/15/24   Darlis Eisenmenger, MD  ezetimibe  (ZETIA ) 10 MG tablet TAKE 1 TABLET BY MOUTH EVERY DAY (patient needs office visit FOR further refills) 03/10/24   Darlis Eisenmenger, MD  fluticasone  (FLONASE ) 50 MCG/ACT nasal spray Place 1 spray into both nostrils as needed for allergies or rhinitis.    [provider]  isosorbide  mononitrate (IMDUR ) 30 MG 24 hr tablet Take 1 tablet (30 mg total) by mouth 2 (two) times daily. 02/23/24   Darlis Eisenmenger, MD  mexiletine (MEXITIL ) 150 MG capsule TAKE TWO CAPSULES BY MOUTH THREE TIMES  DAILY 10/09/23   Tammie Fall, MD  Multiple Vitamins-Minerals (MULTIVITAMINS THER. W/MINERALS) TABS Take 1 tablet by mouth daily.      [provider]  nitroGLYCERIN  (NITROSTAT ) 0.4 MG SL tablet PLACE 1 TABLET UNDER THE TONGUE EVERY 5 MINUTES FOR 3 DOSES AS NEEDED CHEST PAIN 09/19/23   Darlis Eisenmenger, MD  oxyCODONE -acetaminophen  (PERCOCET) 10-325 MG tablet Take 1 tablet by mouth every 4 (four) hours as needed for pain.    [provider]  pantoprazole  (PROTONIX ) 40 MG tablet TAKE 1 TABLET BY MOUTH DAILY 03/10/24   McLean, Dalton S, MD  potassium chloride  SA (KLOR-CON  M) 20 MEQ tablet TAKE 2 TABLETS BY MOUTH EVERY MORNING and TAKE 1 TABLET EVERY EVENING 11/10/23   Darlis Eisenmenger, MD  pravastatin  (PRAVACHOL ) 20 MG tablet Take 20 mg by mouth 3 (three) times a week.    [provider]  promethazine  (PHENERGAN ) 25 MG tablet TAKE 1 TABLET BY MOUTH EVERY 8 HOURS AS NEEDED 04/20/24   Tommas Fragmin A, FNP  sacubitril -valsartan  (ENTRESTO ) 24-26 MG Take 1 tablet by mouth 2 (two) times daily. 07/16/23   Gerard Knight, MD  Semaglutide -Weight Management (WEGOVY ) 0.25 MG/0.5ML SOAJ Inject 0.25 mg into the skin once a week. 04/20/24   Darlis Eisenmenger, MD  sennosides-docusate sodium  (SENOKOT-S) 8.6-50 MG tablet Take 1 tablet by mouth daily as needed.  [provider]  spironolactone  (ALDACTONE ) 25 MG tablet TAKE 1 TABLET BY MOUTH ONCE DAILY 03/08/24   Gerard Knight, MD  torsemide  (DEMADEX ) 20 MG tablet Take 2 tablets (40 mg total) by mouth 2 (two) times daily. 02/23/24   Darlis Eisenmenger, MD  traZODone  (DESYREL ) 100 MG tablet Take 1 tablet (100 mg total) by mouth at bedtime as needed for sleep. 12/09/23   Yevette Hem, FNP  Vericiguat  (VERQUVO ) 5 MG TABS TAKE 1 TABLET BY MOUTH ONCE DAILY 01/07/24   Gerard Knight, MD  warfarin (COUMADIN ) 5 MG tablet TAKE ONE-HALF TO 1 TABLET BY MOUTH EVERY DAY OR USE AS DIRECTED by coumadin  clinic 03/08/24   Gerard Knight, MD       Allergies    Beef-derived drug products, Metoclopramide hcl, Penicillins, Pork-derived products, Metoprolol, and Statins    Review of Systems   Review of Systems  Physical Exam Updated Vital Signs BP 105/72   Pulse 72   Temp 98 F (36.7 C) (Oral)   Resp 16   Ht 5' 5.5" (1.664 m)   Wt 91.6 kg   SpO2 92%   BMI 33.10 kg/m  Physical Exam Vitals and nursing note reviewed.  Constitutional:      Appearance: She is well-developed.  HENT:     Head: Normocephalic and atraumatic.  Cardiovascular:     Rate and Rhythm: Normal rate and regular rhythm.  Pulmonary:     Effort: No respiratory distress.     Breath sounds: No stridor.  Abdominal:     General: There is no distension.  Musculoskeletal:        General: Tenderness (to the left wrist with deformity, to left lateral thigh with deep hematoma) and deformity present.     Cervical back: Normal range of motion.  Skin:    Comments: Ecchymosis to left groin - chaperoned by nurse  Neurological:     Mental Status: She is alert.     ED Results / Procedures / Treatments   Labs (all labs ordered are listed, but only abnormal results are displayed) Labs Reviewed  CBC WITH DIFFERENTIAL/PLATELET - Abnormal; Notable for the following components:      Result Value   WBC 12.2 (*)    Hemoglobin 15.3 (*)    HCT 46.8 (*)    Neutro Abs 10.0 (*)    All other components within normal limits  BASIC METABOLIC PANEL WITH GFR - Abnormal; Notable for the following components:   Sodium 133 (*)    Chloride 95 (*)    Glucose, Bld 123 (*)    Creatinine, Ser 1.43 (*)    Calcium  8.7 (*)    GFR, Estimated 42 (*)    All other components within normal limits  PROTIME-INR - Abnormal; Notable for the following components:   Prothrombin Time 34.1 (*)    INR 3.3 (*)    All other components within normal limits    EKG None  Radiology CT PELVIS WO CONTRAST Result Date: 05/03/2024 CLINICAL DATA:  [name] [name] with injury to the upper left thigh and  hip. EXAM: CT PELVIS WITHOUT CONTRAST TECHNIQUE: Multidetector CT imaging of the pelvis was performed following the standard protocol without intravenous contrast. RADIATION DOSE REDUCTION: This exam was performed according to the departmental dose-optimization program which includes automated exposure control, adjustment of the mA and/or kV according to patient size and/or use of iterative reconstruction technique. COMPARISON:  CT abdomen pelvis 08/29/2011 FINDINGS: Urinary Tract: Small caliber pelvic ureters. Distal ureteral  stones. No bladder thickening. Bowel: No dilatation or wall thickening. Mobile cecum right iliac fossa. Appendix is not seen in this patient. It appears to have been surgically removed. Vascular/Lymphatic: Moderate to heavy aortoiliac calcific plaque. No vascular aneurysm. No inguinopelvic adenopathy. Reproductive:  Uterus and adnexa are unremarkable. Other: There is moderate subcutaneous stranding extending over the left lower abdominopelvic anterior wall to the mons pubis. Additional partially visualized stranding in the lateral left hip area. No space-occupying hematoma is seen. There is mild partial atrophy in the gluteal musculature. Musculoskeletal: There is no evidence of fractures. There is mild osteopenia. There is symmetric partial joint space loss of both hips with small acetabular and femoral head osteophytes, narrowing and spurring of the SI joints. The pubic symphysis is unremarkable. There are no significant findings at the lowest 2 lumbar levels. There are mild features of pelvic enthesopathy. Slight spurring along the greater trochanters. No focal pathologic process is seen. Bone island incidentally noted in the posterior column of the left acetabulum. IMPRESSION: 1. No evidence of fractures. 2. Osteopenia and degenerative change. 3. Subcutaneous stranding extending over the left lower abdominopelvic anterior wall to the mons pubis. Additional partially visualized stranding in  the lateral left hip area. No space-occupying hematoma is seen. 4. Aortic and iliac atherosclerosis. 5. Mobile cecum. Aortic Atherosclerosis (ICD10-I70.0). Electronically Signed   By: Denman Fischer M.D.   On: 05/03/2024 06:23   DG Wrist Complete Left Result Date: 05/03/2024 CLINICAL DATA:  Status post attempted reduction of wrist fracture. EXAM: LEFT WRIST - COMPLETE 3+ VIEW COMPARISON:  Earlier today FINDINGS: Interval placement of overlying splint material. Comminuted, impacted, intra-articular fracture involving the distal radius is again noted. Partial reduction of previous dorsal angulation of the distal fracture fragments. Nondisplaced distal diaphyseal ulnar fracture is again noted and appears stable in position. Mildly displaced ulnar styloid fracture. IMPRESSION: 1. Interval placement of overlying splint material. 2. Comminuted, impacted intra-articular fracture of the distal radius with partial reduction of previous dorsal angulation. 3. Stable position of nondisplaced distal diaphyseal ulnar fracture. Electronically Signed   By: Kimberley Penman M.D.   On: 05/03/2024 05:51   DG Wrist Complete Left Result Date: 05/03/2024 CLINICAL DATA:  Recent fall with left wrist pain, initial encounter EXAM: LEFT WRIST - COMPLETE 3+ VIEW COMPARISON:  None Available. FINDINGS: Comminuted distal radial fracture is noted with inter articular involvement. Impaction and posterior angulation is noted at the fracture site. Additionally comminuted distal ulnar fracture is noted with articular involvement and avulsion of the ulnar styloid. Carpal bones are within normal limits. IMPRESSION: Comminuted distal radial and ulnar fractures with articular involvement. Electronically Signed   By: Violeta Grey M.D.   On: 05/03/2024 03:22    Procedures .Reduction of fracture  Date/Time: 05/04/2024 6:34 AM  Performed by: Eve Hinders, MD Authorized by: Eve Hinders, MD  Consent: Verbal consent obtained. Consent given by:  patient Patient understanding: patient states understanding of the procedure being performed Imaging studies: imaging studies available Patient identity confirmed: verbally with patient and arm band Time out: Immediately prior to procedure a "time out" was called to verify the correct patient, procedure, equipment, support staff and site/side marked as required. Preparation: Patient was prepped and draped in the usual sterile fashion. Local anesthesia used: yes Anesthesia: hematoma block  Anesthesia: Local anesthesia used: yes Local Anesthetic: lidocaine  2% without epinephrine Anesthetic total: 10 mL  Sedation: Patient sedated: no  Patient tolerance: patient tolerated the procedure well with no immediate complications  Medications Ordered in ED Medications  oxyCODONE -acetaminophen  (PERCOCET/ROXICET) 5-325 MG per tablet 2 tablet (2 tablets Oral Given 05/03/24 0414)  lidocaine  HCl (PF) (XYLOCAINE ) 2 % injection 10 mL (10 mLs Infiltration Given by Other 05/03/24 9147)    ED Course/ Medical Decision Making/ A&P                                 Medical Decision Making Amount and/or Complexity of Data Reviewed Labs: ordered. Radiology: ordered.  Risk Prescription drug management.   Patient too high risk for sedation, so pain meds and hematoma block helped with pain enough for her to relax and allow me to attempt reduction however wasn't able to get full anatomic alignment. Splinted with the tech and NVI afterwards. Has pain meds at home. I discussed ortho follow up with her established practice however patient preferred to go where her son had gone.  No e/o pelvis fracture or large hematoma associated with brusing noted on exam but is slightly supratherapeutic on her INR so will hold on one dose of coumadin  and has a recheck already scheduled for this week so will call them to ask for further recs.   Final Clinical Impression(s) / ED Diagnoses Final diagnoses:  Closed  fracture of left wrist, initial encounter  Supratherapeutic INR    Rx / DC Orders ED Discharge Orders     None         Jennene Downie, Reymundo Caulk, MD 05/04/24 [id number]    Eve Hinders, MD 05/04/24 [phone]

## 2024-05-05 ENCOUNTER — Ambulatory Visit: Attending: Cardiology | Admitting: *Deleted

## 2024-05-05 ENCOUNTER — Other Ambulatory Visit (HOSPITAL_COMMUNITY): Payer: Self-pay | Admitting: Cardiology

## 2024-05-05 DIAGNOSIS — Z5181 Encounter for therapeutic drug level monitoring: Secondary | ICD-10-CM

## 2024-05-05 DIAGNOSIS — I5042 Chronic combined systolic (congestive) and diastolic (congestive) heart failure: Secondary | ICD-10-CM

## 2024-05-05 DIAGNOSIS — I679 Cerebrovascular disease, unspecified: Secondary | ICD-10-CM | POA: Diagnosis not present

## 2024-05-05 DIAGNOSIS — D6859 Other primary thrombophilia: Secondary | ICD-10-CM | POA: Diagnosis not present

## 2024-05-05 DIAGNOSIS — D6861 Antiphospholipid syndrome: Secondary | ICD-10-CM

## 2024-05-05 DIAGNOSIS — I4819 Other persistent atrial fibrillation: Secondary | ICD-10-CM

## 2024-05-05 LAB — POCT INR: INR: 3.9 — AB (ref 2.0–3.0)

## 2024-05-05 NOTE — Patient Instructions (Signed)
 05/07/24  Lt wrist surgery  05/03/24  Labs:  Scr 1.43  CrCl 62.61  Hgb 15.3  Hct 46.8  Plts 184k  Wt. 96kg  Lovenox  150mg  SQ at 7am  5/5 Last dose of warfarin  INR 3.3 in ED 5/6 -5/8  No warfarin or Lovenox  (due to INR of 3.9 on 5/7) 5/9  No Lovenox  -----surgery--------Warfarin 5mg  in pm 5/10 - 5/13  Lovenox  150mg  in am and warfarin 5mg  in pm 5/14  Lovenox  150mg  in am -------INR appt at 3:00pm

## 2024-05-06 ENCOUNTER — Encounter: Payer: Self-pay | Admitting: Cardiology

## 2024-05-06 ENCOUNTER — Telehealth: Payer: Self-pay | Admitting: Pharmacist

## 2024-05-06 NOTE — Telephone Encounter (Signed)
 Patient called, surgery rescheduled for 5/13. Went over new bridge instructions with patient.  5/8: No warfarin or enoxaparin  (Lovenox ).  5/9: Inject enoxaparin150mg  in the fatty abdominal tissue at least 2 inches from the belly button once daily  5/10: Inject enoxaparin  in the fatty tissue every 24 hours. No warfarin.  5/11: Inject enoxaparin  in the fatty tissue every 24 hours. No warfarin  5/12: Inject enoxaparin  in the fatty tissue in the morning at 8 am. No warfarin.  5/13: Procedure Day - No enoxaparin  - Resume warfarin in the evening or as directed by doctor (take an extra half tablet with usual dose for 2 days then resume normal dose).  5/14: Resume enoxaparin  inject in the fatty tissue every 24 hours and take warfarin  5/15: Inject enoxaparin  in the fatty tissue every 24 hours and take warfarin  5/16: Inject enoxaparin  in the fatty tissue every 24 hours and take warfarin  5/17: Inject enoxaparin  in the fatty tissue every 24 hours and take warfarin  5/18: Inject enoxaparin  in the fatty tissue every 24 hours and take warfarin  5/19: warfarin appt to check INR.

## 2024-05-07 ENCOUNTER — Encounter: Payer: Self-pay | Admitting: *Deleted

## 2024-05-07 ENCOUNTER — Telehealth: Payer: Self-pay | Admitting: *Deleted

## 2024-05-07 DIAGNOSIS — Z79899 Other long term (current) drug therapy: Secondary | ICD-10-CM | POA: Diagnosis not present

## 2024-05-07 DIAGNOSIS — M47816 Spondylosis without myelopathy or radiculopathy, lumbar region: Secondary | ICD-10-CM | POA: Diagnosis not present

## 2024-05-07 DIAGNOSIS — Z6834 Body mass index (BMI) 34.0-34.9, adult: Secondary | ICD-10-CM | POA: Diagnosis not present

## 2024-05-07 DIAGNOSIS — M1711 Unilateral primary osteoarthritis, right knee: Secondary | ICD-10-CM | POA: Diagnosis not present

## 2024-05-07 DIAGNOSIS — N1831 Chronic kidney disease, stage 3a: Secondary | ICD-10-CM | POA: Diagnosis not present

## 2024-05-07 DIAGNOSIS — E6609 Other obesity due to excess calories: Secondary | ICD-10-CM | POA: Diagnosis not present

## 2024-05-07 DIAGNOSIS — G894 Chronic pain syndrome: Secondary | ICD-10-CM | POA: Diagnosis not present

## 2024-05-07 DIAGNOSIS — R7401 Elevation of levels of liver transaminase levels: Secondary | ICD-10-CM | POA: Diagnosis not present

## 2024-05-07 NOTE — Progress Notes (Signed)
 Error

## 2024-05-07 NOTE — H&P (Signed)
 Preoperative History & Physical Exam  Surgeon: Zeno Hikes, MD  Diagnosis: Closed fracture of distal end of left radius  Planned Procedure: Procedure(s) (LRB): OPEN REDUCTION INTERNAL FIXATION (ORIF) DISTAL RADIUS FRACTURE (Left)  History of Present Illness:   Patient is a 63 y.o. female with symptoms consistent with Closed fracture of distal end of left radius who presents for surgical intervention. The risks, benefits and alternatives of surgical intervention were discussed and informed consent was obtained prior to surgery.  Past Medical History:  Past Medical History:  Diagnosis Date   Aneurysm of internal carotid artery    Anti-phospholipid syndrome (HCC)    Anticardiolipin antibody positive    Chronic Coumadin    Brain aneurysm    followed by Dr. Alvira Josephs   Chronic combined systolic (congestive) and diastolic (congestive) heart failure (HCC)    Cocaine abuse in remission [facility])    Coronary atherosclerosis of native coronary artery    Previous invasive cardiac testing was done at a facility in [place], [place].  Occluded diagonal, Diffuse LAD disease, Occluded Cx, and non obstructive RCA.    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 Gwendlyn Lemmings L1 cage, 5 disc fusion   CARDIAC DEFIBRILLATOR  PLACEMENT  2008   [employer] ICD   CARDIOVERSION N/A 12/01/2020   Procedure: CARDIOVERSION;  Surgeon: Darlis Eisenmenger, MD;  Location: [facility] ENDOSCOPY;  Service: Cardiovascular;  Laterality: N/A;   CORONARY STENT INTERVENTION N/A 01/08/2021   Procedure: CORONARY STENT INTERVENTION;  Surgeon: Swaziland, Peter M, MD;  Location: [facility] INVASIVE CV LAB;  Service: Cardiovascular;  Laterality: N/A;   EP IMPLANTABLE DEVICE N/A 03/01/2016   Procedure: ICD Generator Changeout;  Surgeon: Tammie Fall, MD;  Location: [facility] INVASIVE CV LAB;  Service: Cardiovascular;  Laterality: N/A;   ICD/BIV ICD FUNCTION(DFT) TEST N/A 03/14/2021   Procedure: ICD/BIV ICD FUNCTION (DFT) TEST;  Surgeon: Tammie Fall, MD;  Location: [facility] INVASIVE CV LAB;  Service: Cardiovascular;  Laterality: N/A;   IR ANGIO INTRA EXTRACRAN SEL COM CAROTID INNOMINATE BILAT MOD SED  05/29/2023   IR ANGIO VERTEBRAL SEL SUBCLAVIAN INNOMINATE BILAT MOD SED  05/29/2023   IR RADIOLOGIST EVAL & MGMT  04/16/2023   IR US  GUIDE VASC ACCESS RIGHT  05/29/2023   L1 corpectomy   11/29/2009   Laryngeal polyp excision     RIGHT/LEFT HEART CATH AND CORONARY ANGIOGRAPHY N/A 12/18/2020   Procedure: RIGHT/LEFT HEART CATH AND CORONARY ANGIOGRAPHY;  Surgeon: Mardell Shade, MD;  Location: [facility] INVASIVE CV LAB;  Service: Cardiovascular;  Laterality: N/A;   RIGHT/LEFT HEART CATH AND CORONARY ANGIOGRAPHY N/A 03/05/2021   Procedure: RIGHT/LEFT HEART CATH AND CORONARY ANGIOGRAPHY;  Surgeon: Darlis Eisenmenger, MD;  Location: [facility] INVASIVE CV LAB;  Service: Cardiovascular;  Laterality:  N/A;   TEE WITHOUT CARDIOVERSION N/A 12/01/2020   Procedure: TRANSESOPHAGEAL ECHOCARDIOGRAM (TEE);  Surgeon: Darlis Eisenmenger, MD;  Location: [facility] ENDOSCOPY;  Service: Cardiovascular;  Laterality: N/AReina Cara ABLATION N/A 03/08/2021   Procedure: Reina Cara ABLATION;  Surgeon: Boyce Byes, MD;  Location: [facility] INVASIVE CV LAB;  Service: Cardiovascular;  Laterality: N/A;    Medications:  Prior to  Admission medications   Medication Sig Start Date End Date Taking? Authorizing Provider  amiodarone  (PACERONE ) 200 MG tablet TAKE 1 AND 1/2 TABLETS BY MOUTH MONDAY-THURSDAY. TAKE 2 TABLETS BY MOUTH FRIDAY-SUNDAY Patient taking differently: Take 100-200 mg by mouth See admin instructions. Take 200 mg every day at bedtime, Take 200 mg Friday, Saturday and Sunday morning, Then take 100 mg Monday - Thursday in morning. 11/10/23  Yes Lasalle Pointer, NP  DULoxetine  (CYMBALTA ) 60 MG capsule TAKE ONE CAPSULE BY MOUTH DAILY 03/08/24  Yes Tommas Fragmin A, FNP  empagliflozin  (JARDIANCE ) 10 MG TABS tablet Take 1 tablet (10 mg total) by mouth daily before breakfast. 06/30/23  Yes Gerard Knight, MD  EPINEPHRINE 0.3 mg/0.3 mL IJ SOAJ injection USE AS DIRECTED 05/02/22  Yes Zorita Hiss, FNP  Evolocumab  (REPATHA  SURECLICK) 140 MG/ML SOAJ Inject 140 mg into the skin every 14 (fourteen) days. 04/15/24  Yes Darlis Eisenmenger, MD  fluticasone  (FLONASE ) 50 MCG/ACT nasal spray Place 1 spray into both nostrils as needed for allergies or rhinitis.   Yes [provider]  isosorbide  mononitrate (IMDUR ) 30 MG 24 hr tablet Take 1 tablet (30 mg total) by mouth 2 (two) times daily. 02/23/24  Yes Darlis Eisenmenger, MD  mexiletine (MEXITIL ) 150 MG capsule TAKE TWO CAPSULES BY MOUTH THREE TIMES DAILY 10/09/23  Yes Tammie Fall, MD  Multiple Vitamins-Minerals (MULTIVITAMINS THER. W/MINERALS) TABS Take 1 tablet by mouth at bedtime.   Yes [provider]  nitroGLYCERIN  (NITROSTAT ) 0.4 MG SL tablet PLACE 1 TABLET UNDER THE TONGUE EVERY 5 MINUTES FOR 3 DOSES AS NEEDED CHEST PAIN 09/19/23  Yes Darlis Eisenmenger, MD  oxyCODONE -acetaminophen  (PERCOCET) 10-325 MG tablet Take 1 tablet by mouth every 4 (four) hours as needed for pain.   Yes [provider]  pantoprazole  (PROTONIX ) 40 MG tablet TAKE 1 TABLET BY MOUTH DAILY 03/10/24  Yes McLean, Dalton S, MD  pravastatin  (PRAVACHOL ) 20 MG tablet Take 20 mg by mouth 3  (three) times a week.   Yes [provider]  promethazine  (PHENERGAN ) 25 MG tablet TAKE 1 TABLET BY MOUTH EVERY 8 HOURS AS NEEDED 04/20/24  Yes Hawks, Christy A, FNP  sacubitril -valsartan  (ENTRESTO ) 24-26 MG Take 1 tablet by mouth 2 (two) times daily. 07/16/23  Yes Gerard Knight, MD  Semaglutide -Weight Management (WEGOVY ) 0.25 MG/0.5ML SOAJ Inject 0.25 mg into the skin once a week. 04/20/24  Yes Darlis Eisenmenger, MD  sennosides-docusate sodium  (SENOKOT-S) 8.6-50 MG tablet Take 1 tablet by mouth daily as needed.   Yes [provider]  spironolactone  (ALDACTONE ) 25 MG tablet TAKE 1 TABLET BY MOUTH ONCE DAILY 03/08/24  Yes Gerard Knight, MD  torsemide  (DEMADEX ) 20 MG tablet Take 2 tablets (40 mg total) by mouth 2 (two) times daily. 02/23/24  Yes Darlis Eisenmenger, MD  traZODone  (DESYREL ) 100 MG tablet Take 1 tablet (100 mg total) by mouth at bedtime as needed for sleep. 12/09/23  Yes Tommas Fragmin A, FNP  Vericiguat  (VERQUVO ) 5 MG TABS TAKE 1 TABLET BY MOUTH ONCE DAILY 01/07/24  Yes Gerard Knight, MD  warfarin (COUMADIN ) 5 MG tablet TAKE ONE-HALF TO 1 TABLET BY MOUTH EVERY DAY OR USE AS DIRECTED by coumadin  clinic Patient taking differently: Take 2.5-5 mg by mouth See admin instructions. Take 5 mg on Mon., Wed., and Friday, all the other days take 2.5 mg in the evening  USE AS DIRECTED by coumadin  clinic 03/08/24  Yes Gerard Knight, MD  carvedilol  (COREG ) 12.5 MG tablet TAKE 1 TABLET BY MOUTH TWICE DAILY WITH A MEAL (NEED FOLLOWING UP APPOINTMENT FOR MORE REFILLS) 05/07/24   Darlis Eisenmenger, MD  enoxaparin  (LOVENOX ) 150 MG/ML injection Inject 1 mL (150 mg total) into the skin daily. 05/04/24   Gerard Knight, MD  ezetimibe  (ZETIA ) 10 MG tablet TAKE 1 TABLET BY MOUTH EVERY DAY (patient needs office visit FOR further refills) 05/07/24   Darlis Eisenmenger, MD  potassium chloride  SA (KLOR-CON  M) 20 MEQ tablet TAKE 2 TABLETS BY MOUTH EVERY MORNING and TAKE 1 TABLET EVERY EVENING  05/07/24   Darlis Eisenmenger, MD    Allergies:  Beef-derived drug products, Metoclopramide hcl, Penicillins, Pork-derived products, Metoprolol, and Statins  Review of Systems: Negative except per HPI.  Physical Exam: Alert and oriented, NAD Head and neck: no masses, normal alignment CV: pulse intact Pulm: no increased work of breathing, respirations even and unlabored Abdomen: non-distended Extremities: extremities warm and well perfused  LABS: Recent Results (from the past 2160 hours)  POCT INR     Status: None   Collection Time: 02/12/24  1:48 PM  Result Value Ref Range   INR 2.5 2.0 - 3.0   POC INR    Comprehensive Metabolic Panel (CMET)     Status: Abnormal   Collection Time: 02/23/24  4:33 PM  Result Value Ref Range   Sodium 137 135 - 145 mmol/L   Potassium 3.7 3.5 - 5.1 mmol/L   Chloride 99 98 - 111 mmol/L   CO2 23 22 - 32 mmol/L   Glucose, Bld 84 70 - 99 mg/dL    Comment: Glucose reference range applies only to samples taken after fasting for at least 8 hours.   BUN 16 6 - 20 mg/dL   Creatinine, Ser 1.61 (H) 0.44 - 1.00 mg/dL   Calcium  8.6 (L) 8.9 - 10.3 mg/dL   Total Protein 7.2 6.5 - 8.1 g/dL   Albumin 3.5 3.5 - 5.0 g/dL   AST 45 (H) 15 - 41 U/L   ALT 48 (H) 0 - 44 U/L   Alkaline Phosphatase 82 38 - 126 U/L   Total Bilirubin 0.6 0.0 - 1.2 mg/dL   GFR, Estimated 54 (L) >60 mL/min    Comment: (NOTE) Calculated using the CKD-EPI Creatinine Equation (2021)    Anion gap 15 5 - 15    Comment: Performed at [facility], [address] [place], [place] [address]  TSH     Status: None   Collection Time: 02/23/24  4:34 PM  Result Value Ref Range   TSH 2.608 0.350 - 4.500 uIU/mL    Comment: Performed by a 3rd Generation assay with a functional sensitivity of <=0.01 uIU/mL. Performed at [facility], [address] [place], [place] [address]   Lipid Profile     Status: None   Collection Time: 02/23/24  4:34 PM  Result Value Ref Range   Cholesterol 142 0  - 200 mg/dL   Triglycerides 52 <119 mg/dL   HDL 54 >14 mg/dL   Total CHOL/HDL Ratio 2.6 RATIO   VLDL  10 0 - 40 mg/dL   LDL Cholesterol 78 0 - 99 mg/dL    Comment:        Total Cholesterol/HDL:CHD Risk Coronary Heart Disease Risk Table                     Men   Women  1/2 Average Risk   3.4   3.3  Average Risk       5.0   4.4  2 X Average Risk   9.6   7.1  3 X Average Risk  23.4   11.0        Use the calculated Patient Ratio above and the CHD Risk Table to determine the patient's CHD Risk.        ATP III CLASSIFICATION (LDL):  <100     mg/dL   Optimal  308-657  mg/dL   Near or Above                    Optimal  130-159  mg/dL   Borderline  846-962  mg/dL   High  >952     mg/dL   Very High Performed at [facility], [address], [place], [place] [address]   CUP PACEART REMOTE DEVICE CHECK     Status: None   Collection Time: 03/10/24  2:11 AM  Result Value Ref Range   Date Time Interrogation Session [phone]    Pulse Generator Manufacturer BOST    Pulse Gen Model D153 [facility]    Pulse Gen Serial Number [id number]    Clinic Name [facility]    Implantable Pulse Generator Type Implantable Cardiac Defibulator    Implantable Pulse Generator Implant Date [id number]    Implantable Lead Manufacturer [facility]    Implantable Lead Model 0185 [employer] G    Implantable Lead Serial Number [id number]    Implantable Lead Implant Date [id number]    Implantable Lead Location [id number]    Implantable Lead Connection Status [id number]    Implantable Lead Manufacturer GUIC    Implantable Lead Model 4136 Dextrus    Implantable Lead Serial Number [id number]    Implantable Lead Implant Date [id number]    Implantable Lead Location [id number]    Implantable Lead Connection Status [id number]    Lead Channel Setting Sensing Sensitivity 0.6 mV   Lead Channel Setting Sensing Adaptation Mode Adaptive Sensing    Lead Channel Setting Pacing Amplitude 2.0 V   Lead Channel Setting Pacing Pulse Width 0.5 ms    Lead Channel Setting Pacing Amplitude 2.4 V   Zone Setting Status Active    Zone Setting Status Active    Zone Setting Status Active    Lead Channel Impedance Value 555 ohm   Lead Channel Pacing Threshold Amplitude 0.6 V   Lead Channel Pacing Threshold Pulse Width 0.5 ms   Lead Channel Impedance Value 494 ohm   Lead Channel Pacing Threshold Amplitude 1.1 V   Lead Channel Pacing Threshold Pulse Width 0.5 ms   HighPow Impedance 51 ohm   Battery Status BOS    Battery Remaining Longevity 78 mo   Battery Remaining Percentage 84 %   Brady Statistic RA Percent Paced 1 %   Brady Statistic RV Percent Paced 5 %   Eval Rhythm sinus rhythm at 68 bpm   POCT INR     Status: Normal   Collection Time: 03/24/24  2:07 PM  Result Value Ref Range   INR 2.2 2.0 - 3.0   POC  INR    VAS US  ABI WITH/WO TBI     Status: None   Collection Time: 04/19/24  1:39 PM  Result Value Ref Range   Right ABI 1.13    Left ABI 0.87   CBC with Differential     Status: Abnormal   Collection Time: 05/03/24  5:20 AM  Result Value Ref Range   WBC 12.2 (H) 4.0 - 10.5 K/uL   RBC 5.04 3.87 - 5.11 MIL/uL   Hemoglobin 15.3 (H) 12.0 - 15.0 g/dL   HCT 16.1 (H) 09.6 - 04.5 %   MCV 92.9 80.0 - 100.0 fL   MCH 30.4 26.0 - 34.0 pg   MCHC 32.7 30.0 - 36.0 g/dL   RDW 40.9 81.1 - 91.4 %   Platelets 184 150 - 400 K/uL   nRBC 0.0 0.0 - 0.2 %   Neutrophils Relative % 82 %   Neutro Abs 10.0 (H) 1.7 - 7.7 K/uL   Lymphocytes Relative 10 %   Lymphs Abs 1.3 0.7 - 4.0 K/uL   Monocytes Relative 6 %   Monocytes Absolute 0.8 0.1 - 1.0 K/uL   Eosinophils Relative 1 %   Eosinophils Absolute 0.1 0.0 - 0.5 K/uL   Basophils Relative 1 %   Basophils Absolute 0.1 0.0 - 0.1 K/uL   Immature Granulocytes 0 %   Abs Immature Granulocytes 0.05 0.00 - 0.07 K/uL    Comment: Performed at [facility], [address] [place], [place] [address]  Basic metabolic panel     Status: Abnormal   Collection Time: 05/03/24  5:20 AM  Result Value Ref Range    Sodium 133 (L) 135 - 145 mmol/L   Potassium 3.7 3.5 - 5.1 mmol/L   Chloride 95 (L) 98 - 111 mmol/L   CO2 25 22 - 32 mmol/L   Glucose, Bld 123 (H) 70 - 99 mg/dL    Comment: Glucose reference range applies only to samples taken after fasting for at least 8 hours.   BUN 19 8 - 23 mg/dL   Creatinine, Ser 6.21 (H) 0.44 - 1.00 mg/dL   Calcium  8.7 (L) 8.9 - 10.3 mg/dL   GFR, Estimated 42 (L) >60 mL/min    Comment: (NOTE) Calculated using the CKD-EPI Creatinine Equation (2021)    Anion gap 13 5 - 15    Comment: Performed at [facility], [address] [place], [place] [address]  Protime-INR     Status: Abnormal   Collection Time: 05/03/24  5:20 AM  Result Value Ref Range   Prothrombin Time 34.1 (H) 11.4 - 15.2 seconds   INR 3.3 (H) 0.8 - 1.2    Comment: (NOTE) INR goal varies based on device and disease states. Performed at [facility], [address], [place], [place] [address]   POCT INR     Status: Abnormal   Collection Time: 05/05/24  2:51 PM  Result Value Ref Range   INR 3.9 (A) 2.0 - 3.0   POC INR       Complete History and Physical exam available in the office notes  Bri Wakeman F Malaquias Lenker

## 2024-05-07 NOTE — Telephone Encounter (Signed)
-----   Message from Nurse Maxie Spaniel sent at 05/07/2024  2:09 PM EDT ----- Regarding: Perioperative Cardiac Device Programming Planned Procedure:  ORIF left distal radius fracture Surgeon:  Dr. Ltanya Rummer Date of Procedure:  05/11/2024 Cautery will be used. Position during surgery:  Supine  Please send documentation back to: Arlin Benes (Fax # [phone])  Secundino Dach, RN  05/07/2024 2:09 PM

## 2024-05-07 NOTE — Progress Notes (Signed)
 PERIOPERATIVE PRESCRIPTION FOR IMPLANTED CARDIAC DEVICE PROGRAMMING  Patient Information: Name:  [name] [name]  DOB:  1961-03-08  MRN:  [id number]  Planned Procedure:  ORIF left distal radius fracture Surgeon:  Dr. Ltanya Rummer Date of Procedure:  05/11/2024 Cautery will be used. Position during surgery:  Supine   Please send documentation back to: Arlin Benes (Fax # [phone]) Device Information:  Clinic EP Physician:  Manya Sells, MD   Device Type:  Defibrillator Manufacturer and Phone #:  [employer]: [phone] Pacemaker Dependent?:  No. Date of Last Device Check:  04/13/24 Normal Device Function?:  Yes.    Electrophysiologist's Recommendations:  Have magnet available. Provide continuous ECG monitoring when magnet is used or reprogramming is to be performed.  Procedure may interfere with device function.  Magnet should be placed over device during procedure.  Per Device Clinic Standing Orders, Mariella Shore, RN  4:38 PM 05/07/2024

## 2024-05-07 NOTE — Telephone Encounter (Signed)
 Duplicate- addressed in Documentation Encounter.

## 2024-05-08 ENCOUNTER — Encounter: Payer: Self-pay | Admitting: Internal Medicine

## 2024-05-10 ENCOUNTER — Encounter (HOSPITAL_COMMUNITY): Payer: Self-pay | Admitting: Orthopedic Surgery

## 2024-05-10 ENCOUNTER — Other Ambulatory Visit: Payer: Self-pay

## 2024-05-10 NOTE — Anesthesia Preprocedure Evaluation (Addendum)
 Anesthesia Evaluation  Patient identified by MRN, date of birth, ID band Patient awake    Reviewed: Allergy & Precautions, H&P , NPO status , Patient's Chart, lab work & pertinent test results  Airway Mallampati: II  TM Distance: >3 FB Neck ROM: Full    Dental  (+) Upper Dentures, Lower Dentures   Pulmonary shortness of breath, Patient abstained from smoking., former smoker   Pulmonary exam normal breath sounds clear to auscultation       Cardiovascular hypertension, Pt. on home beta blockers + CAD, + Past MI, + Cardiac Stents, + Peripheral Vascular Disease and +CHF  Normal cardiovascular exam+ dysrhythmias Atrial Fibrillation + Cardiac Defibrillator  Rhythm:Regular Rate:Normal  Echo 06/2023  1. Left ventricular ejection fraction, by estimation, is 30 to 35%. The left ventricle has moderately decreased function. The left ventricle demonstrates regional wall motion abnormalities (see scoring diagram/findings for description). The left ventricular internal cavity size was moderately dilated. Left ventricular diastolic parameters are consistent with Grade I diastolic dysfunction (impaired relaxation).   2. Right ventricular systolic function is normal. The right ventricular size is normal. There is normal pulmonary artery systolic pressure.   3. Left atrial size was mild to moderately dilated.   4. The mitral valve is normal in structure. Mild mitral valve regurgitation. No evidence of mitral stenosis.   5. The aortic valve has an indeterminant number of cusps. Aortic valve regurgitation is not visualized. No aortic stenosis is present.   6. The inferior vena cava is normal in size with greater than 50% respiratory variability, suggesting right atrial pressure of 3 mmHg.   Comparison(s): No significant change from prior study. Prior images reviewed side by side.    Echo 12/2021  1. Left ventricular ejection fraction, by estimation, is 30  to 35%. The left ventricle has moderately decreased function. The left ventricle demonstrates regional wall motion abnormalities with mid to apical anterior and anteroseptal akinesis, apical akinesis. The left ventricular internal cavity size was severely dilated. There is mild left ventricular hypertrophy. Left ventricular diastolic parameters are consistent with Grade I diastolic dysfunction (impaired relaxation).   2. Right ventricular systolic function is normal. The right ventricular size is normal. Tricuspid regurgitation signal is inadequate for assessing PA pressure.   3. Left atrial size was mild to moderately dilated.   4. The aortic valve is tricuspid. Aortic valve regurgitation is not visualized. No aortic stenosis is present.   5. The mitral valve is normal in structure. Moderate mitral valve regurgitation. No evidence of mitral stenosis.   6. The inferior vena cava is normal in size with greater than 50% respiratory variability, suggesting right atrial pressure of 3 mmHg.    Cath 02/2021 1. Low filling pressures.  2. Preserved cardiac output.  3. Stable coronaries, patent LCx stent.  Known chronic occlusion of D1.     TEE (11/2020): EF 25-30%. HK anterior and septum.   Neuro/Psych  Headaches PSYCHIATRIC DISORDERS Anxiety     CVA    GI/Hepatic ,GERD  ,,(+)     substance abuse  cocaine use  Endo/Other    Renal/GU      Musculoskeletal   Abdominal   Peds  Hematology Anticardiolipin antibody. Takes coumadin .   Anesthesia Other Findings   Reproductive/Obstetrics                             Anesthesia Physical Anesthesia Plan  ASA: 4  Anesthesia Plan: Regional   Post-op Pain  Management: Tylenol  PO (pre-op)* and Regional block*   Induction: Intravenous  PONV Risk Score and Plan: 2 and Ondansetron , Treatment may vary due to age or medical condition, TIVA and Propofol  infusion  Airway Management Planned: Simple Face Mask  Additional  Equipment: ClearSight  Intra-op Plan:   Post-operative Plan:   Informed Consent: I have reviewed the patients History and Physical, chart, labs and discussed the procedure including the risks, benefits and alternatives for the proposed anesthesia with the patient or authorized representative who has indicated his/her understanding and acceptance.     Dental advisory given  Plan Discussed with: CRNA  Anesthesia Plan Comments: (PAT note written 05/10/2024 by Allison Zelenak, PA-C.  Magnet )        Anesthesia Quick Evaluation

## 2024-05-10 NOTE — Progress Notes (Signed)
 Anesthesia Chart Review: SAME DAY WORK-UP  Case: [id number] Date/Time: 05/11/24 0715   Procedure: OPEN REDUCTION INTERNAL FIXATION (ORIF) DISTAL RADIUS FRACTURE (Left)   Anesthesia type: Regional   Diagnosis: Closed fracture of distal end of left radius, unspecified fracture morphology, initial encounter [S52.502A]   Pre-op diagnosis: Closed fracture of distal end of left radius   [facility]: [facility] ROOM 18 / [facility] OR   Surgeons: Ltanya Rummer, MD       DISCUSSION: Patient is a 63 year old female scheduled for the above procedure. On 05/03/24, she had a mechanical fall and sustained a left wrist fracture involving the radius and ulnar.    History includes former smoker (quit 07/21/21), HTN, HLD, CAD (subendocraidal MI with occluded DIAG & LCX, EF 25% 10/2007 is [place]; DES LCX 01/08/21), chronic combined systolic and diastolic CHF, ischemic cardiomyopathy, ICD (10/2007 is [place]; last exchange 03/01/16 [employer] D153 [facility] ICD), afib (10/2020, s/p DCCV 12/01/20), NSVT (2007; 11/2020; 02/2021 with shock s/p VT ablation 03/08/21; 05/2021, 09/2021 treated with ATP, mexiletine increased; 02/26/24, s/p ATP), anticardiolipin antibody syndrome (diagnosed ~ 2004, on warfarin), PVD (left SFA stenosis 05/25/14), carotid artery disease (CTA RICA diagnosed 2007), CVA (2007), 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  There is no obvious explanation for the thrombocytopenia.  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   Telephonic evaluation on 05/04/24 by Slater Duncan, NP.  "Given past medical history and time since last visit, based on ACC/AHA guidelines, [name] [name] would be at acceptable risk for the planned procedure without further cardiovascular testing.    She is limited by balance issues but remains active with chair exercises and house keeping. She has to use a walker at times due to instability. She is able to achieve > 4 METS activity without concerning cardiac symptoms...   She reports she has an appointment with CVRR clinic in [place] on 05/05/24 for Lovenox  bridging instructions."  She has known episodes of NSVT, s/p VT ablation in 2022. Medications include amiodarone , mexiletine, Coreg . Last ATP delivered by device for SVT->VT was on 02/26/24. Dr. Carolynne Citron reviewed. On 03/01/24, " change in treatment.  She has a lot of VT."  EP ICD Perioperative Recommendations: Device Type:  Biochemist, clinical and Phone #:  [employer]: [phone] Pacemaker Dependent?:  No. Date of Last Device Check:  04/13/24           Normal Device Function?:  Yes.     Electrophysiologist's Recommendations:  Have magnet available. Provide continuous ECG monitoring when magnet is used or reprogramming is to be performed.  Procedure may interfere with device function.  Magnet should be placed over device during procedure.   Last Jardiance  (on for HF) was on 05/10/24. Last Wegovy  04/29/24. Last warfarin 05/05/24 with Lovenox  bridge instructions outlined on 05/05/24 per [facility].   Anesthesia team to evaluate on the day of surgery.   VS:  Wt Readings from Last 3 Encounters:  05/03/24 91.6 kg  03/11/24 96.1 kg  02/23/24 98.2 kg   BP Readings from Last 3 Encounters:  05/03/24 105/72  03/11/24 102/61  02/23/24 100/60   Pulse Readings from Last 3 Encounters:  05/03/24 72  03/11/24 84  02/23/24 82     PROVIDERS: Yevette Hem, FNP is PCP Teddie Favre, MD is cardiologist. APP visit 09/11/23. Manya Sells, MD is EP cardiologist. APP visit  01/28/24. Peder Bourdon, MD is HF cardiologist. Last visit 02/24/24.  Deveshwar, Sanjeev, MD is RI   LABS: For day of surgery as indicated. Last results in [facility] include: Lab Results  Component Value Date   WBC 12.2 (H) 05/03/2024   HGB 15.3 (H) 05/03/2024   HCT 46.8 (H) 05/03/2024   PLT 184 05/03/2024   GLUCOSE 123 (H) 05/03/2024   CHOL 142 02/23/2024   TRIG 52 02/23/2024   HDL 54 02/23/2024   LDLCALC 78 02/23/2024   ALT 48 (H) 02/23/2024   AST 45 (H) 02/23/2024   NA 133 (L) 05/03/2024   K 3.7 05/03/2024   CL 95 (L) 05/03/2024   CREATININE 1.43 (H) 05/03/2024   BUN 19 05/03/2024   CO2 25 05/03/2024   TSH 2.608 02/23/2024   INR 3.9 (A) 05/05/2024   HGBA1C 4.6 (L) 12/29/2023   Creatinine 1.42 on 05/03/24, previously ~ 1.1 - 1.2 range since 2023 in [facility].   Sleep Study 07/19/15: IMPRESSIONS  No significant obstructive sleep apnea occurred during this study (AHI = 2.3/h). No significant central sleep apnea occurred during this study (CAI = 0.2/h). The patient snored with Moderate snoring volume. EKG findings include PVCs. Clinically significant periodic limb movements did not occur during sleep. No significant associated arousals.   IMAGES: CT Pelvis 05/03/24: IMPRESSION: 1. No evidence of fractures. 2. Osteopenia and degenerative change. 3. Subcutaneous stranding extending over the left lower abdominopelvic anterior wall to the mons pubis. Additional partially visualized stranding in the lateral left hip area. No space-occupying hematoma is seen. 4. Aortic and iliac atherosclerosis. 5. Mobile cecum. Aortic Atherosclerosis (ICD10-I70.0).  Xray left wrist 05/03/24: IMPRESSION: 1. Interval placement of overlying splint material. 2. Comminuted, impacted intra-articular fracture of the distal radius with partial reduction of previous dorsal angulation. 3. Stable position of nondisplaced distal diaphyseal ulnar fracture.  4V Cerebral Arteriogram 05/29/23: Findings. 1.   Stable occluded right ICA proximally with distal reconstitution in the cavernous segment from the external carotid artery branches via the ophthalmic artery with flow into the right middle cerebral artery distribution. 2.  5.3 mm x 3.8 mm right paraophthalmic region aneurysm, stable 3.  Right MCA and right ACA  supplied from collaterals from the right P3 segment of the right PCA, and from the left ICA via the anterior communicating artery.   EKG: 02/23/24: Sinus rhythm with 1st degree A-V block Septal infarct (cited on or before 12-Jul-2021) Abnormal ECG When compared with ECG of 05-Dec-2023 15:02, Nonspecific T wave abnormality now evident in Lateral leads Confirmed by Pasqual Bone ([id number]) on 02/23/2024 9:52:02 PM   CV: TTE 07/08/23: IMPRESSIONS   1. Left ventricular ejection fraction, by estimation, is 30 to 35%. The  left ventricle has moderately decreased function. The left ventricle  demonstrates regional wall motion abnormalities (see scoring  diagram/findings for description). The left  ventricular internal cavity size was moderately dilated. Left ventricular  diastolic parameters are consistent with Grade I diastolic dysfunction  (impaired relaxation).   2. Right ventricular systolic function is normal. The right ventricular  size is normal. There is normal pulmonary artery systolic pressure.   3. Left atrial size was mild to moderately dilated.   4. The mitral valve is normal in structure. Mild mitral valve  regurgitation. No evidence of mitral stenosis.  5. The aortic valve has an indeterminant number of cusps. Aortic valve  regurgitation is not visualized. No aortic stenosis is present.   6. The inferior vena cava is normal in size with greater than 50%  respiratory variability, suggesting right atrial pressure of 3 mmHg.  - Comparison(s): No significant change from prior study. Prior images reviewed side by side.    US  Carotid 07/08/23: Summary:  - Right Carotid: Evidence  consistent with a total occlusion of the right ICA.  - Left Carotid: Velocities in the left ICA are consistent with a 1-39% stenosis.  - Vertebrals: Bilateral vertebral arteries demonstrate antegrade flow.  - Subclavians: Normal flow hemodynamics were seen in bilateral subclavian arteries.    RHC/LHC 03/05/21: Left main: No significant coronary disease. Left anterior descending colon luminal regularities in the LAD.  Moderate D1 is chronically occluded in the mid vessel (seen on prior cath). Ramus intermedius: Moderate vessel, no significant disease. Left circumflex: Patent stent in the proximal LCx, mild luminal regularities. Right coronary artery: No significant coronary disease.  RHC Procedural Findings: Hemodynamics (mmHg) RA 1 RV 32/1 PA 19/4, mean 12 PCWP mean 7 LV 121/6 AO 124/57  Oxygen saturations: PA 73% AO 99%  Cardiac Output (Fick) 5.22  Cardiac Index (Fick) 2.69   CONCLUSION: 1. Low filling pressures.  2. Preserved cardiac output.  3. Stable coronaries, patent LCx stent.  Known chronic occlusion of D1.    Past Medical History:  Diagnosis Date   AICD (automatic cardioverter/defibrillator) present    Aneurysm of internal carotid artery    Anti-phospholipid syndrome (HCC)    Anticardiolipin antibody positive    Chronic Coumadin    Brain aneurysm    followed by Dr. Alvira Josephs   Chronic combined systolic (congestive) and diastolic (congestive) heart failure (HCC)    Cocaine abuse in remission [facility])    Coronary atherosclerosis of native coronary artery    Previous invasive cardiac testing was done at a facility in [place], [place].  Occluded diagonal, Diffuse LAD disease, Occluded Cx, and non obstructive RCA.    Essential hypertension    GERD (gastroesophageal reflux disease)    Headache    History of pneumonia    ICD (implantable cardioverter-defibrillator) in place    Ischemic cardiomyopathy    LVEF 30-35%   Mixed hyperlipidemia    Myocardial infarction  [facility])    NSVT (nonsustained ventricular tachycardia) (HCC)    Persistent atrial fibrillation (HCC)    PVD (peripheral vascular disease) (HCC)    Raynaud's phenomenon    Statin intolerance    Stroke [facility]) 2007   Total occlusion of the right internal carotid    Past Surgical History:  Procedure Laterality Date   ABDOMINAL AORTAGRAM N/A 05/25/2014   Procedure: ABDOMINAL Tommi Fraise;  Surgeon: Wenona Hamilton, MD;  Location: [facility] CATH LAB;  Service: Cardiovascular;  Laterality: N/A;   APPENDECTOMY     BACK SURGERY  2010   Dr. Gwendlyn Lemmings L1 cage, 5 disc fusion   CARDIAC DEFIBRILLATOR PLACEMENT  2008   [employer] ICD   CARDIOVERSION N/A 12/01/2020   Procedure: CARDIOVERSION;  Surgeon: Darlis Eisenmenger, MD;  Location: [facility] ENDOSCOPY;  Service: Cardiovascular;  Laterality: N/A;   CORONARY STENT INTERVENTION N/A 01/08/2021   Procedure: CORONARY STENT INTERVENTION;  Surgeon: [place]az[place], Peter M, MD;  Location: [facility] INVASIVE CV LAB;  Service: Cardiovascular;  Laterality: N/A;   EP IMPLANTABLE DEVICE N/A 03/01/2016   Procedure: ICD Generator Changeout;  Surgeon: Tammie Fall, MD;  Location: [facility] INVASIVE CV LAB;  Service: Cardiovascular;  Laterality: N/A;   ICD/BIV ICD FUNCTION(DFT) TEST N/A 03/14/2021   Procedure: ICD/BIV ICD FUNCTION (DFT) TEST;  Surgeon: Tammie Fall, MD;  [facility] INVAS[facility] CV LAB;  Service: Cardiovascular;  Laterality: N/A;   IR ANGIO INTRA EXTRACRAN SEL COM CAROTID INNOMINATE BILAT MOD SED  05/29/2023   IR ANGIO VERTEBRAL SEL SUBCLAVIAN INNOMINATE BILAT MOD SED  05/29/2023   IR RADIOLOGIST EVAL & MGMT  04/16/2023   IR US  GUIDE VASC ACCESS RIGHT  05/29/2023   L1 corpectomy   11/29/2009   Laryngeal polyp excision     RIGHT/LEFT HEART CATH AND CORONARY ANGIOGRAPHY N/A 12/18/2020   Procedure: RIGHT/LEFT HEART CATH AND CORONARY ANGIOGRAPHY;  Surgeon: Mardell Shade, MD;  [facility]: [facility] INVASIVE CV LAB;  Service: Cardiovascular;  Laterality: N/A;   RIGHT/LEFT HEART CATH AND CORONARY  ANGIOGRAPHY N/A 03/05/2021   Procedure: RIGHT/LEFT HEART CATH AND CORONARY ANGIOGRAPHY;  Surgeon: Darlis Eisenmenger, MD;  Location: [facility] INVASIVE CV LAB;  Service: Cardiovascular;  Laterality: N/A;   TEE WITHOUT CARDIOVERSION N/A 12/01/2020   Procedure: TRANSESOPHAGEAL ECHOCARDIOGRAM (TEE);  Surgeon: Darlis Eisenmenger, MD;  Location: [facility] ENDOSCOPY;  Service: Cardiovascular;  Laterality: N/AReina Cara ABLATION N/A 03/08/2021   Procedure: Reina Cara ABLATION;  Surgeon: Boyce Byes, MD;  [facility] INVASIVE CV LAB;  Service: Cardiovascular;  Laterality: N/A;    MEDICATIONS: No current facility-administered medications for this encounter.    amiodarone  (PACERONE ) 200 MG tablet   DULoxetine  (CYMBALTA ) 60 MG capsule   empagliflozin  (JARDIANCE ) 10 MG TABS tablet   EPINEPHRINE 0.3 mg/0.3 mL IJ SOAJ injection   Evolocumab  (REPATHA  SURECLICK) 140 MG/ML SOAJ   fluticasone  (FLONASE ) 50 MCG/ACT nasal spray   isosorbide  mononitrate (IMDUR ) 30 MG 24 hr tablet   mexiletine (MEXITIL ) 150 MG capsule   Multiple Vitamins-Minerals (MULTIVITAMINS THER. W/MINERALS) TABS   nitroGLYCERIN  (NITROSTAT ) 0.4 MG SL tablet   oxyCODONE -acetaminophen  (PERCOCET) 10-325 MG tablet   pantoprazole  (PROTONIX ) 40 MG tablet   pravastatin  (PRAVACHOL ) 20 MG tablet   promethazine  (PHENERGAN ) 25 MG tablet   sacubitril -valsartan  (ENTRESTO ) 24-26 MG   Semaglutide -Weight Management (WEGOVY ) 0.25 MG/0.5ML SOAJ   sennosides-docusate sodium  (SENOKOT-S) 8.6-50 MG tablet   spironolactone  (ALDACTONE ) 25 MG tablet   torsemide  (DEMADEX ) 20 MG tablet   traZODone  (DESYREL ) 100 MG tablet   Vericiguat  (VERQUVO ) 5 MG TABS   warfarin (COUMADIN ) 5 MG tablet   carvedilol  (COREG ) 12.5 MG tablet   enoxaparin  (LOVENOX ) 150 MG/ML injection   ezetimibe  (ZETIA ) 10 MG tablet   potassium chloride  SA (KLOR-CON  M) 20 MEQ tablet    Ella Gun, PA-C Surgical Short Stay/Anesthesiology [facility] Phone [phone] [facility] Phone [phone] 05/10/2024  10:24 AM

## 2024-05-10 NOTE — Progress Notes (Addendum)
 SDW CALL  Patient was given pre-op instructions over the phone. The opportunity was given for the patient to ask questions. No further questions asked. Patient verbalized understanding of instructions given.   PCP - Tommas Fragmin Cardiologist -  Dr. Virgel Griffes EP - Dr. Manya Sells HF Clinic - Dr. Mitzie Anda  PPM/ICD - [facility] Scientific ICD Device Orders - received - in chart  Rep Notified - paged rep at 0921 on 5/12     -Per Joey and device orders, rep will not be needed.   Chest x-ray -  EKG - 04/22/24 Stress Test -  ECHO - 07/08/23 Cardiac Cath - 03/05/21   Sleep Study - denies  Patient does not have DM.  Takes Jardiance  for HF and last dose was 5/12.     Last dose of GLP1 agonist-  last dose of Wegovy  was 5/1 GLP1 instructions:  patient instructed not to take prior to procedure  Blood Thinner Instructions:  patients last dose of Coumadin  was 5/7.  Started on Lovenox  bridge.  Patient is aware of instructions (in chart) Aspirin  Instructions: n/a  ERAS Protcol - clears until 0430 PRE-SURGERY Ensure or G2-  n/a  COVID TEST- n/a   Anesthesia review: yes - ICD, CHF, CAD, HTN, stroke, aneurysm  Patient denies shortness of breath, fever, cough and chest pain over the phone call   All instructions explained to the patient, with a verbal understanding of the material. Patient agrees to go over the instructions while at home for a better understanding.

## 2024-05-11 ENCOUNTER — Ambulatory Visit (HOSPITAL_COMMUNITY): Payer: Self-pay | Admitting: Vascular Surgery

## 2024-05-11 ENCOUNTER — Encounter (HOSPITAL_COMMUNITY)
Admission: RE | Disposition: A | Discharge status: Home / Self Care | Payer: Self-pay | Source: Home / Self Care | Attending: Orthopedic Surgery

## 2024-05-11 ENCOUNTER — Other Ambulatory Visit: Payer: Self-pay

## 2024-05-11 ENCOUNTER — Encounter (HOSPITAL_COMMUNITY): Payer: Self-pay | Admitting: Orthopedic Surgery

## 2024-05-11 ENCOUNTER — Ambulatory Visit (HOSPITAL_COMMUNITY)

## 2024-05-11 ENCOUNTER — Ambulatory Visit (HOSPITAL_COMMUNITY)
Admission: RE | Admit: 2024-05-11 | Discharge: 2024-05-11 | Disposition: A | Discharge status: Home / Self Care | Attending: Orthopedic Surgery | Admitting: Orthopedic Surgery

## 2024-05-11 DIAGNOSIS — I2510 Atherosclerotic heart disease of native coronary artery without angina pectoris: Secondary | ICD-10-CM | POA: Diagnosis not present

## 2024-05-11 DIAGNOSIS — X58XXXA Exposure to other specified factors, initial encounter: Secondary | ICD-10-CM | POA: Diagnosis not present

## 2024-05-11 DIAGNOSIS — I255 Ischemic cardiomyopathy: Secondary | ICD-10-CM | POA: Diagnosis not present

## 2024-05-11 DIAGNOSIS — I252 Old myocardial infarction: Secondary | ICD-10-CM | POA: Insufficient documentation

## 2024-05-11 DIAGNOSIS — Z7901 Long term (current) use of anticoagulants: Secondary | ICD-10-CM | POA: Diagnosis not present

## 2024-05-11 DIAGNOSIS — Z95810 Presence of automatic (implantable) cardiac defibrillator: Secondary | ICD-10-CM | POA: Insufficient documentation

## 2024-05-11 DIAGNOSIS — Z8673 Personal history of transient ischemic attack (TIA), and cerebral infarction without residual deficits: Secondary | ICD-10-CM | POA: Diagnosis not present

## 2024-05-11 DIAGNOSIS — Z955 Presence of coronary angioplasty implant and graft: Secondary | ICD-10-CM | POA: Insufficient documentation

## 2024-05-11 DIAGNOSIS — Z79899 Other long term (current) drug therapy: Secondary | ICD-10-CM | POA: Diagnosis not present

## 2024-05-11 DIAGNOSIS — I4819 Other persistent atrial fibrillation: Secondary | ICD-10-CM | POA: Diagnosis not present

## 2024-05-11 DIAGNOSIS — I110 Hypertensive heart disease with heart failure: Secondary | ICD-10-CM

## 2024-05-11 DIAGNOSIS — E782 Mixed hyperlipidemia: Secondary | ICD-10-CM | POA: Insufficient documentation

## 2024-05-11 DIAGNOSIS — I739 Peripheral vascular disease, unspecified: Secondary | ICD-10-CM | POA: Diagnosis not present

## 2024-05-11 DIAGNOSIS — S52502A Unspecified fracture of the lower end of left radius, initial encounter for closed fracture: Secondary | ICD-10-CM | POA: Diagnosis not present

## 2024-05-11 DIAGNOSIS — I509 Heart failure, unspecified: Secondary | ICD-10-CM

## 2024-05-11 DIAGNOSIS — Z7984 Long term (current) use of oral hypoglycemic drugs: Secondary | ICD-10-CM | POA: Insufficient documentation

## 2024-05-11 DIAGNOSIS — S52572A Other intraarticular fracture of lower end of left radius, initial encounter for closed fracture: Secondary | ICD-10-CM | POA: Diagnosis not present

## 2024-05-11 DIAGNOSIS — K219 Gastro-esophageal reflux disease without esophagitis: Secondary | ICD-10-CM | POA: Insufficient documentation

## 2024-05-11 DIAGNOSIS — I5042 Chronic combined systolic (congestive) and diastolic (congestive) heart failure: Secondary | ICD-10-CM | POA: Insufficient documentation

## 2024-05-11 HISTORY — DX: Acute myocardial infarction, unspecified: I21.9

## 2024-05-11 HISTORY — PX: OPEN REDUCTION INTERNAL FIXATION (ORIF) DISTAL RADIAL FRACTURE: SHX5989

## 2024-05-11 HISTORY — DX: Gastro-esophageal reflux disease without esophagitis: K21.9

## 2024-05-11 HISTORY — DX: Presence of automatic (implantable) cardiac defibrillator: Z95.810

## 2024-05-11 LAB — PROTIME-INR
INR: 1.1 (ref 0.8–1.2)
Prothrombin Time: 14.6 s (ref 11.4–15.2)

## 2024-05-11 LAB — APTT: aPTT: 32 s (ref 24–36)

## 2024-05-11 SURGERY — OPEN REDUCTION INTERNAL FIXATION (ORIF) DISTAL RADIUS FRACTURE
Anesthesia: Regional | Laterality: Left

## 2024-05-11 MED ORDER — DEXAMETHASONE SODIUM PHOSPHATE 10 MG/ML IJ SOLN
INTRAMUSCULAR | Status: DC | PRN
  Administered 2024-05-11: 5 mg via INTRAVENOUS

## 2024-05-11 MED ORDER — BACITRACIN ZINC 500 UNIT/GM EX OINT
TOPICAL_OINTMENT | CUTANEOUS | Status: AC
  Filled 2024-05-11: qty 28.35

## 2024-05-11 MED ORDER — ORAL CARE MOUTH RINSE: 15.0000 mL | Freq: Once | OROMUCOSAL | Status: AC

## 2024-05-11 MED ORDER — 0.9 % SODIUM CHLORIDE (POUR BTL) OPTIME
TOPICAL | Status: DC | PRN
  Administered 2024-05-11: 1000 mL

## 2024-05-11 MED ORDER — PHENYLEPHRINE HCL (PRESSORS) 10 MG/ML IV SOLN
INTRAVENOUS | Status: AC
  Filled 2024-05-11: qty 1

## 2024-05-11 MED ORDER — FENTANYL CITRATE (PF) 100 MCG/2ML IJ SOLN
INTRAMUSCULAR | Status: AC
  Filled 2024-05-11: qty 2

## 2024-05-11 MED ORDER — ONDANSETRON HCL 4 MG/2ML IJ SOLN
INTRAMUSCULAR | Status: DC | PRN
  Administered 2024-05-11: 4 mg via INTRAVENOUS

## 2024-05-11 MED ORDER — LIDOCAINE 2% (20 MG/ML) 5 ML SYRINGE
INTRAMUSCULAR | Status: AC
  Filled 2024-05-11: qty 5

## 2024-05-11 MED ORDER — PROPOFOL 10 MG/ML IV BOLUS
INTRAVENOUS | Status: AC
  Filled 2024-05-11: qty 20

## 2024-05-11 MED ORDER — FENTANYL CITRATE (PF) 250 MCG/5ML IJ SOLN
INTRAMUSCULAR | Status: AC
  Filled 2024-05-11: qty 5

## 2024-05-11 MED ORDER — PROPOFOL 500 MG/50ML IV EMUL
INTRAVENOUS | Status: DC | PRN
  Administered 2024-05-11: 125 ug/kg/min via INTRAVENOUS

## 2024-05-11 MED ORDER — CEFAZOLIN SODIUM-DEXTROSE 2-4 GM/100ML-% IV SOLN
2.0000 g | INTRAVENOUS | Status: AC
  Administered 2024-05-11: 2 g via INTRAVENOUS
  Filled 2024-05-11: qty 100

## 2024-05-11 MED ORDER — ALBUMIN HUMAN 5 % IV SOLN: INTRAVENOUS | Status: DC | PRN

## 2024-05-11 MED ORDER — PHENYLEPHRINE HCL (PRESSORS) 10 MG/ML IV SOLN
INTRAVENOUS | Status: AC
Start: 2024-05-11 — End: ?
  Filled 2024-05-11: qty 1

## 2024-05-11 MED ORDER — CLONIDINE HCL (ANALGESIA) 100 MCG/ML EP SOLN
EPIDURAL | Status: DC | PRN
Start: 2024-05-11 — End: 2024-05-11
  Administered 2024-05-11: 80 ug

## 2024-05-11 MED ORDER — ROPIVACAINE HCL 5 MG/ML IJ SOLN
INTRAMUSCULAR | Status: DC | PRN
  Administered 2024-05-11: 35 mL via PERINEURAL

## 2024-05-11 MED ORDER — ONDANSETRON HCL 4 MG/2ML IJ SOLN
INTRAMUSCULAR | Status: AC
  Filled 2024-05-11: qty 2

## 2024-05-11 MED ORDER — DEXAMETHASONE SODIUM PHOSPHATE 10 MG/ML IJ SOLN
INTRAMUSCULAR | Status: AC
  Filled 2024-05-11: qty 1

## 2024-05-11 MED ORDER — CHLORHEXIDINE GLUCONATE 0.12 % MT SOLN
15.0000 mL | Freq: Once | OROMUCOSAL | Status: AC
  Administered 2024-05-11: 15 mL via OROMUCOSAL
  Filled 2024-05-11: qty 15

## 2024-05-11 MED ORDER — EPHEDRINE SULFATE-NACL 50-0.9 MG/10ML-% IV SOSY
PREFILLED_SYRINGE | INTRAVENOUS | Status: DC | PRN
  Administered 2024-05-11 (×2): 2.5 mg via INTRAVENOUS
  Administered 2024-05-11 (×2): 5 mg via INTRAVENOUS

## 2024-05-11 MED ORDER — PHENYLEPHRINE HCL-NACL 20-0.9 MG/250ML-% IV SOLN
INTRAVENOUS | Status: DC | PRN
  Administered 2024-05-11: 25 ug/min via INTRAVENOUS

## 2024-05-11 MED ORDER — MIDAZOLAM HCL 2 MG/2ML IJ SOLN
INTRAMUSCULAR | Status: DC | PRN
  Administered 2024-05-11: 2 mg via INTRAVENOUS

## 2024-05-11 MED ORDER — BUPIVACAINE HCL (PF) 0.25 % IJ SOLN
INTRAMUSCULAR | Status: AC
  Filled 2024-05-11: qty 30

## 2024-05-11 MED ORDER — FENTANYL CITRATE (PF) 100 MCG/2ML IJ SOLN
INTRAMUSCULAR | Status: DC | PRN
  Administered 2024-05-11 (×2): 50 ug via INTRAVENOUS

## 2024-05-11 MED ORDER — DEXAMETHASONE SODIUM PHOSPHATE 4 MG/ML IJ SOLN
INTRAMUSCULAR | Status: DC | PRN
  Administered 2024-05-11: 5 mg via PERINEURAL

## 2024-05-11 MED ORDER — VASOPRESSIN 20 UNIT/ML IV SOLN
INTRAVENOUS | Status: AC
  Filled 2024-05-11: qty 1

## 2024-05-11 MED ORDER — MIDAZOLAM HCL 2 MG/2ML IJ SOLN
INTRAMUSCULAR | Status: AC
  Filled 2024-05-11: qty 2

## 2024-05-11 MED ORDER — LACTATED RINGERS IV SOLN
INTRAVENOUS | Status: DC
Start: 2024-05-11 — End: 2024-05-12

## 2024-05-11 MED ORDER — LIDOCAINE-EPINEPHRINE 1 %-1:100000 IJ SOLN
INTRAMUSCULAR | Status: AC
  Filled 2024-05-11: qty 1

## 2024-05-11 SURGICAL SUPPLY — 48 items
BAG COUNTER SPONGE SURGICOUNT (BAG) ×1 IMPLANT
BIT DRILL 2.2 SS TIBIAL (BIT) IMPLANT
BLADE CLIPPER SURG (BLADE) IMPLANT
BNDG ELASTIC 3INX 5YD STR LF (GAUZE/BANDAGES/DRESSINGS) ×1 IMPLANT
BNDG ELASTIC 4INX 5YD STR LF (GAUZE/BANDAGES/DRESSINGS) IMPLANT
BNDG ELASTIC 4X5.8 VLCR STR LF (GAUZE/BANDAGES/DRESSINGS) ×1 IMPLANT
BNDG ESMARK 4X9 LF (GAUZE/BANDAGES/DRESSINGS) ×1 IMPLANT
BNDG GAUZE DERMACEA FLUFF 4 (GAUZE/BANDAGES/DRESSINGS) ×1 IMPLANT
CANISTER SUCTION 3000ML PPV (SUCTIONS) ×1 IMPLANT
CORD BIPOLAR FORCEPS 12FT (ELECTRODE) ×1 IMPLANT
COVER SURGICAL LIGHT HANDLE (MISCELLANEOUS) ×1 IMPLANT
CUFF TOURN SGL QUICK 18X4 (TOURNIQUET CUFF) ×1 IMPLANT
CUFF TRNQT CYL 24X4X16.5-23 (TOURNIQUET CUFF) IMPLANT
DRAPE OEC MINIVIEW 54X84 (DRAPES) ×1 IMPLANT
DRAPE SURG 17X23 STRL (DRAPES) ×1 IMPLANT
DRSG ADAPTIC 3X8 NADH LF (GAUZE/BANDAGES/DRESSINGS) ×1 IMPLANT
DRSG EMULSION OIL 3X3 NADH (GAUZE/BANDAGES/DRESSINGS) ×1 IMPLANT
GAUZE SPONGE 4X4 12PLY STRL (GAUZE/BANDAGES/DRESSINGS) ×1 IMPLANT
GLOVE BIO SURGEON STRL SZ7.5 (GLOVE) ×1 IMPLANT
GLOVE BIOGEL PI IND STRL 7.5 (GLOVE) ×1 IMPLANT
GOWN STRL REUS W/ TWL LRG LVL3 (GOWN DISPOSABLE) ×2 IMPLANT
HIBICLENS CHG 4% 4OZ BTL (MISCELLANEOUS) ×1 IMPLANT
KIT BASIN OR (CUSTOM PROCEDURE TRAY) ×1 IMPLANT
KIT TURNOVER KIT B (KITS) ×1 IMPLANT
MANIFOLD NEPTUNE II (INSTRUMENTS) ×1 IMPLANT
NDL HYPO 25GX1X1/2 BEV (NEEDLE) ×1 IMPLANT
NEEDLE HYPO 25GX1X1/2 BEV (NEEDLE) IMPLANT
NS IRRIG 1000ML POUR BTL (IV SOLUTION) ×1 IMPLANT
PACK ORTHO EXTREMITY (CUSTOM PROCEDURE TRAY) ×1 IMPLANT
PAD ARMBOARD POSITIONER FOAM (MISCELLANEOUS) ×2 IMPLANT
PAD CAST 3X4 CTTN HI CHSV (CAST SUPPLIES) ×1 IMPLANT
PAD CAST 4YDX4 CTTN HI CHSV (CAST SUPPLIES) ×1 IMPLANT
PADDING CAST SYNTHETIC 3X4 NS (CAST SUPPLIES) IMPLANT
PEG LOCKING SMOOTH 2.2X16 (Screw) IMPLANT
PEG LOCKING SMOOTH 2.2X18 (Peg) IMPLANT
PLATE BN MN NAR 41X22 CRSLCK (Plate) IMPLANT
SCREW BN 14X2.7XNONLOCK 3 LD (Screw) IMPLANT
SCREW LOCK 14X2.7X 3 LD TPR (Screw) IMPLANT
SLING ARM FOAM STRAP LRG (SOFTGOODS) IMPLANT
SPLINT FIBERGLASS 3X12 (CAST SUPPLIES) IMPLANT
SUT ETHILON 4 0 PS 2 18 (SUTURE) ×1 IMPLANT
SUT VICRYL RAPIDE 3 0 (SUTURE) IMPLANT
SYR CONTROL 10ML LL (SYRINGE) IMPLANT
TOWEL GREEN STERILE (TOWEL DISPOSABLE) ×1 IMPLANT
TOWEL GREEN STERILE FF (TOWEL DISPOSABLE) ×1 IMPLANT
TUBE CONNECTING 12X1/4 (SUCTIONS) ×1 IMPLANT
UNDERPAD 30X36 HEAVY ABSORB (UNDERPADS AND DIAPERS) ×1 IMPLANT
WATER STERILE IRR 1000ML POUR (IV SOLUTION) ×1 IMPLANT

## 2024-05-11 NOTE — Anesthesia Procedure Notes (Signed)
 Anesthesia Regional Block: Axillary brachial plexus block   Pre-Anesthetic Checklist: , timeout performed,  Correct Patient, Correct Site, Correct Laterality,  Correct Procedure, Correct Position, site marked,  Risks and benefits discussed,  Surgical consent,  Pre-op evaluation,  At surgeon's request and post-op pain management  Laterality: Upper and Left  Prep: chloraprep       Needles:  Injection technique: Single-shot  Needle Type: Stimiplex          Additional Needles:   Procedures:,,,, ultrasound used (permanent image in chart),,    Narrative:  Start time: 05/11/2024 7:02 AM End time: 05/11/2024 7:22 AM Injection made incrementally with aspirations every 5 mL.  Performed by: Personally  Anesthesiologist: Gorman Laughter, MD  Additional Notes: BP cuff, SpO2 and EKG monitors applied. Sedation begun. Nerve location verified with ultrasound. Anesthetic injected incrementally, slowly, and after neg aspirations under direct u/s guidance. Good perineural spread. Tolerated well.

## 2024-05-11 NOTE — Discharge Instructions (Signed)
  Orthopaedic Hand Surgery Discharge Instructions  WEIGHT BEARING STATUS: Non weight bearing on operative extremity  DRESSING CARE: Please keep your dressing/splint/cast clean and dry until your follow-up appointment. You may shower by placing a waterproof covering over your dressing/splint/cast. Contact your surgeon if your splint/cast gets wet. It will need to be changed to prevent skin breakdown.  PAIN CONTROL: First line medications for post operative pain control are Tylenol (acetaminophen) and Motrin (ibuprofen) if you are able to take these medications. If you have been prescribed a medication these can be taken as breakthrough pain medications. Please note that some narcotic pain medication has acetaminophen added and you should never consume more than 4,000mg of acetaminophen in 24-hour period. Please note that if you are given Toradol (ketorolac) you should not take similar medications such as ibuprofen or naproxen.  DISCHARGE MEDICATIONS: If you have been prescribed medication it was sent electronically to your pharmacy. No changes have been made to your home medications.  ICE/ELEVATION: Ice and elevate your injured extremity as needed. Avoid direct contact of ice with skin.   BANDAGE FEELS TOO TIGHT: If your bandage feels too tight, first make sure you are elevating your fingers as much as possible. The outer layer of the bandage can be unwrapped and reapplied more loosely. If no improvement, you may carefully cut the inner layer longitudinally until the pressure has resolved and then rewrap the outer layer. If you are not comfortable with these instructions, please call the office and the bandage can be changed for you.   FOLLOW UP: You will be called after surgery with an appointment date and time, however if you have not received a phone call within 3 days, please call during regular office hours at [phone] to schedule a post operative appointment.  Please Seek Medical Attention  if: Call MD for: pain or pressure in chest, jaw, arm, back, neck  Call MD for: temperature greater than 101 F for more than 24 hrs Call MD for: difficulty breathing Call MD for: incision redness, bleeding, drainage  Call MD for: palpitations or feeling that the heart is racing  Call MD for: increased swelling in arm, leg, ankle, or abdomen  Call MD for: lightheadedness, dizziness, fainting Call 911 or go to ER for any medical emergency if you are not able to get in touch with your doctor   J. Reid Danyl Deems, MD Orthopaedic Hand Surgeon [facility] Office number: [phone] [address] [place], [place] [address]  

## 2024-05-11 NOTE — Transfer of Care (Signed)
 Immediate Anesthesia Transfer of Care Note  Patient: [name] [name]  Procedure(s) Performed: OPEN REDUCTION INTERNAL FIXATION (ORIF) DISTAL RADIUS FRACTURE (Left)  Patient Location: PACU  Anesthesia Type:MAC and Regional  Level of Consciousness: awake and oriented  Airway & Oxygen Therapy: Patient Spontanous Breathing and Patient connected to face mask oxygen  Post-op Assessment: Report given to RN and Post -op Vital signs reviewed and stable  Post vital signs: Reviewed and stable  Last Vitals:  Vitals Value Taken Time  BP 100/48 05/11/24 0902  Temp    Pulse 69 05/11/24 0906  Resp 13 05/11/24 0906  SpO2 96 % 05/11/24 0906  Vitals shown include unfiled device data.  Last Pain:  Vitals:   05/11/24 0643  TempSrc:   PainSc: 0-No pain         Complications: No notable events documented.

## 2024-05-11 NOTE — Progress Notes (Signed)
 Pt. Fell 1 week ago, has bruising on left chin area, left upper leg laterally and also upper inner thigh.

## 2024-05-11 NOTE — Interval H&P Note (Signed)
 History and Physical Interval Note:  05/11/2024 7:31 AM  [name] [name]  has presented today for surgery, with the diagnosis of Closed fracture of distal end of left radius.  The various methods of treatment have been discussed with the patient and family. After consideration of risks, benefits and other options for treatment, the patient has consented to  Procedure(s): OPEN REDUCTION INTERNAL FIXATION (ORIF) DISTAL RADIUS FRACTURE (Left) as a surgical intervention.  The patient's history has been reviewed, patient examined, no change in status, stable for surgery.  I have reviewed the patient's chart and labs.  Questions were answered to the patient's satisfaction.     Ltanya Rummer

## 2024-05-11 NOTE — Op Note (Signed)
 OPERATIVE NOTE  DATE OF PROCEDURE: 05/11/2024  SURGEONS: Primary: Ltanya Rummer, MD  ASSISTANT: Prentiss Brocks, PA-C  Due to the complexity of the surgery an assistant was necessary to aid in retraction, exposure, limb positioning, closure and dressing application. The use of an assistant on this case follows CMS and CPT guidelines, which allows an assistant to be used because of the complexity level of this case.   PREOPERATIVE DIAGNOSIS: Closed fracture of distal end of left radius  POSTOPERATIVE DIAGNOSIS: Same  NAME OF PROCEDURE:    Left distal radius open reduction internal fixation, intra- articular, >3 fragments 2.    Left wrist brachioradialis tenotomy  3.    Left wrist radiographs four views with intraoperative interpretation   ANESTHESIA: Regional Block + MAC  SKIN PREPARATION: Hibiclens   ESTIMATED BLOOD LOSS: Minimal  IMPLANTS: Biomet DVR Crosslock volar plate and screws  Implant Name Type Inv. Item Serial No. Manufacturer Lot No. LRB No. Used Action  PLATE BN MN NAR 41X22 CRSLCK - [id number] Plate PLATE BN MN NAR 41X22 CRSLCK  [employer](ORTH,TRAU,BIO,SG)  Left 1 Implanted  PEG LOCKING SMOOTH 2.2X16 - [id number] Screw PEG LOCKING SMOOTH 2.2X16  [employer](ORTH,TRAU,BIO,SG)  Left 1 Implanted  PEG LOCKING SMOOTH 2.2X18 - [id number] Peg PEG LOCKING SMOOTH 2.2X18  [employer](ORTH,TRAU,BIO,SG)  Left 5 Implanted  SCREW LOCK 14X2.7X 3 LD TPR - [id number] Screw SCREW LOCK 14X2.7X 3 LD TPR  [employer](ORTH,TRAU,BIO,SG)  Left 3 Implanted  SCREW BN 14X2.7XNONLOCK 3 LD - [id number] Screw SCREW BN 14X2.7XNONLOCK 3 LD  ZIMMER [employer]ON(ORTH,TRAU,BIO,SG)  Left 1 Implanted    INDICATIONS:  [name] [name] is a 63 y.o. female who has the above preoperative diagnosis. The patient has decided to proceed with surgical intervention.  Risks, benefits and alternatives of operative management were discussed including, but not limited to, risks of anesthesia complications, infection, pain, persistent  symptoms, stiffness, need for future surgery.  The patient understands, agrees and elects to proceed with surgery.    DESCRIPTION OF PROCEDURE: The patient was met in the pre-operative area and their identity was verified.  The operative location and laterality was also verified and marked.  The patient was brought to the OR and was placed supine on the table.  After repeat patient identification with the operative team anesthesia was provided and the patient was prepped and draped in the usual sterile fashion.  A final timeout was performed verifying the correction patient, procedure, location and laterality.  Preoperative antibiotics were administered. The left upper extremity was exsanguinated with an Esmarch and tourniquet inflated to . Under loupe magnification, an incision was made directly over the flexor carpi radialis (FCR) tendon. Bipolar was utilized for hemostasis. The roof of the FCR tendon sheath was incised. The FCR tendon was then retracted ulnarly to protect the palmar cutaneous branch of the median nerve. Subsequently, the floor of the FCR tendon sheath was incised over the distal end of the radius.  The flexor pollicis longus (FPL) was swept ulnarly to reveal the pronator quadratus.  The pronator quadratus fascia was incised from its distal and radial borders. A periosteal elevator was utilized to mobilize the pronator quadratus muscle off the distal radius.  The fracture site was irrigated and prepared for reduction with a freer and adson forceps. There were >3 distal radius fracture fragments. The brachioradialis tendon insertion was released to facilitate reduction.  This release was performed by identifying the broad insertion of the brachiradialis tendon and also identifying the 1st dorsal compartment tendons.  The 1st dorsal  compartment tendons were protected, the broad tendon insertion was released under direct visualization. The fracture was reduced and provisionally fixed with  K-wires. We then selected a proper length and width volar plate.  The plate was placed on the distal end of the radius with the fracture reduced and secured to the bone. Using mini C-arm the fracture reduction and position of the plate were deemed to be satisfactory.  We proceeded with securing the plate to the radius with the 1 bicortical nonlocking screw in the oblong portion of the shaft.  With the intermediate column reduced, we secured the distal end of the plate with 2 screws in the distal ulnar portion of plate.  We again used the C-arm to verify satisfactory plate position as well as fracture reduction. The radial column was then reduced and radial styloid locking screws were placed. The remainder shaft screws were placed through the plate. We used the mini C-arm to verify satisfactory plate position, screw lengths and fracture reduction. The DRUJ was then tested in neutral, pronation and supination and was found to be stable.The tourniquet was deflated. Meticulous hemostasis was obtained. The incision was copiously irrigated with normal saline and closed with interrupted 4-0 nylon horizontal mattress sutures. The incision was covered with xeroform, sterile guaze, webril and well padded short arm splint. The fingers were pink, warm and well perfused. All counts were correct. The patient was awoken from anesthesia and brought to PACU for recovery in stable condition.  Postop Plan: 2 weeks in cast after first postop.    [name] Hikes, MD Orthopaedic Hand Surgery

## 2024-05-11 NOTE — Anesthesia Postprocedure Evaluation (Signed)
 Anesthesia Post Note  Patient: [name] [name]  Procedure(s) Performed: OPEN REDUCTION INTERNAL FIXATION (ORIF) DISTAL RADIUS FRACTURE (Left)     Patient location during evaluation: PACU Anesthesia Type: Regional Level of consciousness: sedated and patient cooperative Pain management: pain level controlled Vital Signs Assessment: post-procedure vital signs reviewed and stable Respiratory status: spontaneous breathing Cardiovascular status: stable Anesthetic complications: no   No notable events documented.  Last Vitals:  Vitals:   05/11/24 0937 05/11/24 0957  BP: (!) 95/56 101/86  Pulse: 66   Resp: 11   Temp: 36.4 C   SpO2: 95%     Last Pain:  Vitals:   05/11/24 0937  TempSrc:   PainSc: 0-No pain                 Gorman Laughter

## 2024-05-12 ENCOUNTER — Encounter

## 2024-05-12 ENCOUNTER — Encounter (HOSPITAL_COMMUNITY): Payer: Self-pay | Admitting: Orthopedic Surgery

## 2024-05-17 ENCOUNTER — Other Ambulatory Visit: Payer: Self-pay | Admitting: Cardiology

## 2024-05-17 ENCOUNTER — Ambulatory Visit: Attending: Cardiology | Admitting: *Deleted

## 2024-05-17 DIAGNOSIS — I4819 Other persistent atrial fibrillation: Secondary | ICD-10-CM

## 2024-05-17 DIAGNOSIS — D6859 Other primary thrombophilia: Secondary | ICD-10-CM

## 2024-05-17 DIAGNOSIS — Z5181 Encounter for therapeutic drug level monitoring: Secondary | ICD-10-CM | POA: Diagnosis not present

## 2024-05-17 LAB — POCT INR: INR: 3 (ref 2.0–3.0)

## 2024-05-17 NOTE — Patient Instructions (Signed)
 S/P Lt wrist surgery 05/07/24 by Dr Kirk Peper. [name] [name]. Restart warfarin 1/2 tablet daily except 1 on Mondays, Wednesdays and Fridays Stop lovenox  Eat extra greens tonight Recheck INR 05/31/24

## 2024-05-18 ENCOUNTER — Encounter: Payer: Self-pay | Admitting: Pharmacist

## 2024-05-19 DIAGNOSIS — M79602 Pain in left arm: Secondary | ICD-10-CM | POA: Diagnosis not present

## 2024-05-19 DIAGNOSIS — S52502D Unspecified fracture of the lower end of left radius, subsequent encounter for closed fracture with routine healing: Secondary | ICD-10-CM | POA: Diagnosis not present

## 2024-05-28 ENCOUNTER — Ambulatory Visit: Payer: Medicare PPO

## 2024-05-29 DIAGNOSIS — Z79899 Other long term (current) drug therapy: Secondary | ICD-10-CM | POA: Diagnosis not present

## 2024-05-29 DIAGNOSIS — G894 Chronic pain syndrome: Secondary | ICD-10-CM | POA: Diagnosis not present

## 2024-05-29 DIAGNOSIS — M47816 Spondylosis without myelopathy or radiculopathy, lumbar region: Secondary | ICD-10-CM | POA: Diagnosis not present

## 2024-05-29 DIAGNOSIS — R7401 Elevation of levels of liver transaminase levels: Secondary | ICD-10-CM | POA: Diagnosis not present

## 2024-05-29 DIAGNOSIS — Z6834 Body mass index (BMI) 34.0-34.9, adult: Secondary | ICD-10-CM | POA: Diagnosis not present

## 2024-05-29 DIAGNOSIS — E6609 Other obesity due to excess calories: Secondary | ICD-10-CM | POA: Diagnosis not present

## 2024-05-29 DIAGNOSIS — M1711 Unilateral primary osteoarthritis, right knee: Secondary | ICD-10-CM | POA: Diagnosis not present

## 2024-05-29 DIAGNOSIS — N1831 Chronic kidney disease, stage 3a: Secondary | ICD-10-CM | POA: Diagnosis not present

## 2024-05-29 DIAGNOSIS — R29818 Other symptoms and signs involving the nervous system: Secondary | ICD-10-CM | POA: Diagnosis not present

## 2024-05-29 DIAGNOSIS — Z6833 Body mass index (BMI) 33.0-33.9, adult: Secondary | ICD-10-CM | POA: Diagnosis not present

## 2024-05-29 DIAGNOSIS — Z87891 Personal history of nicotine dependence: Secondary | ICD-10-CM | POA: Diagnosis not present

## 2024-05-29 DIAGNOSIS — F17210 Nicotine dependence, cigarettes, uncomplicated: Secondary | ICD-10-CM | POA: Diagnosis not present

## 2024-05-31 ENCOUNTER — Ambulatory Visit: Attending: Cardiology

## 2024-06-06 ENCOUNTER — Other Ambulatory Visit: Payer: Self-pay | Admitting: Family

## 2024-06-06 DIAGNOSIS — F4310 Post-traumatic stress disorder, unspecified: Secondary | ICD-10-CM

## 2024-06-06 DIAGNOSIS — F411 Generalized anxiety disorder: Secondary | ICD-10-CM

## 2024-06-09 ENCOUNTER — Ambulatory Visit: Payer: PPO

## 2024-06-09 DIAGNOSIS — I255 Ischemic cardiomyopathy: Secondary | ICD-10-CM

## 2024-06-09 DIAGNOSIS — S52502D Unspecified fracture of the lower end of left radius, subsequent encounter for closed fracture with routine healing: Secondary | ICD-10-CM | POA: Diagnosis not present

## 2024-06-10 ENCOUNTER — Encounter: Payer: Self-pay | Admitting: Internal Medicine

## 2024-06-10 ENCOUNTER — Encounter: Payer: Self-pay | Admitting: Pharmacist

## 2024-06-10 ENCOUNTER — Encounter: Payer: Self-pay | Admitting: Cardiology

## 2024-06-11 ENCOUNTER — Telehealth: Payer: Self-pay

## 2024-06-11 ENCOUNTER — Other Ambulatory Visit: Payer: Self-pay | Admitting: Cardiology

## 2024-06-11 LAB — CUP PACEART REMOTE DEVICE CHECK
Battery Remaining Longevity: 78 mo
Battery Remaining Percentage: 84 %
Brady Statistic RA Percent Paced: 1 %
Brady Statistic RV Percent Paced: 2 %
Date Time Interrogation Session: 20250612174600
HighPow Impedance: 53 Ohm
Implantable Lead Connection Status: 753985
Implantable Lead Connection Status: 753985
Implantable Lead Implant Date: 20081119
Implantable Lead Implant Date: 20081119
Implantable Lead Location: 753859
Implantable Lead Location: 753860
Implantable Lead Model: 185
Implantable Lead Model: 4136
Implantable Lead Serial Number: 211124
Implantable Lead Serial Number: 28383796
Implantable Pulse Generator Implant Date: 20170303
Lead Channel Impedance Value: 502 Ohm
Lead Channel Impedance Value: 606 Ohm
Lead Channel Pacing Threshold Amplitude: 0.6 V
Lead Channel Pacing Threshold Amplitude: 1.1 V
Lead Channel Pacing Threshold Pulse Width: 0.5 ms
Lead Channel Pacing Threshold Pulse Width: 0.5 ms
Lead Channel Setting Pacing Amplitude: 2 V
Lead Channel Setting Pacing Amplitude: 2.4 V
Lead Channel Setting Pacing Pulse Width: 0.5 ms
Lead Channel Setting Sensing Sensitivity: 0.6 mV
Pulse Gen Serial Number: 204398

## 2024-06-11 NOTE — Telephone Encounter (Signed)
 Alert received from [employer] for  Antitachycardia pacing (ATP) therapy delivered to convert arrhythmia. Event occurred 6/11 @ 16:50, duration 25sec, HR 134.  EGM's c/w sustained retrograde VT pace terminated with 1 burst of ATP.  1 15sec NSVT prior.  --------------------------------------------------------------------  This is not a new finding for patient. Patient does not drive per records.  Anticipate no changes.   Routing to Dr. Carolynne Citron to advise further.

## 2024-06-12 DIAGNOSIS — E6609 Other obesity due to excess calories: Secondary | ICD-10-CM | POA: Diagnosis not present

## 2024-06-12 DIAGNOSIS — M1711 Unilateral primary osteoarthritis, right knee: Secondary | ICD-10-CM | POA: Diagnosis not present

## 2024-06-12 DIAGNOSIS — I255 Ischemic cardiomyopathy: Secondary | ICD-10-CM | POA: Diagnosis not present

## 2024-06-12 DIAGNOSIS — I5022 Chronic systolic (congestive) heart failure: Secondary | ICD-10-CM | POA: Diagnosis not present

## 2024-06-12 DIAGNOSIS — R7401 Elevation of levels of liver transaminase levels: Secondary | ICD-10-CM | POA: Diagnosis not present

## 2024-06-12 DIAGNOSIS — Z79899 Other long term (current) drug therapy: Secondary | ICD-10-CM | POA: Diagnosis not present

## 2024-06-12 DIAGNOSIS — M47816 Spondylosis without myelopathy or radiculopathy, lumbar region: Secondary | ICD-10-CM | POA: Diagnosis not present

## 2024-06-12 DIAGNOSIS — Z6832 Body mass index (BMI) 32.0-32.9, adult: Secondary | ICD-10-CM | POA: Diagnosis not present

## 2024-06-12 DIAGNOSIS — M170 Bilateral primary osteoarthritis of knee: Secondary | ICD-10-CM | POA: Diagnosis not present

## 2024-06-13 ENCOUNTER — Ambulatory Visit: Payer: Self-pay | Admitting: Internal Medicine

## 2024-06-17 NOTE — Telephone Encounter (Signed)
Noted. No change in treatment.

## 2024-06-22 ENCOUNTER — Other Ambulatory Visit: Payer: Self-pay | Admitting: Family

## 2024-06-22 ENCOUNTER — Encounter (HOSPITAL_COMMUNITY): Payer: PPO

## 2024-06-22 DIAGNOSIS — R110 Nausea: Secondary | ICD-10-CM

## 2024-06-25 ENCOUNTER — Other Ambulatory Visit: Payer: Self-pay

## 2024-06-25 ENCOUNTER — Emergency Department (HOSPITAL_COMMUNITY)

## 2024-06-25 ENCOUNTER — Inpatient Hospital Stay (HOSPITAL_COMMUNITY)
Admission: EM | Admit: 2024-06-25 | Discharge: 2024-07-30 | DRG: 871 | Disposition: E | Attending: Pulmonary Disease | Admitting: Pulmonary Disease

## 2024-06-25 ENCOUNTER — Encounter (HOSPITAL_COMMUNITY): Payer: Self-pay | Admitting: Interventional Radiology

## 2024-06-25 ENCOUNTER — Encounter (HOSPITAL_COMMUNITY): Payer: Self-pay

## 2024-06-25 DIAGNOSIS — K8020 Calculus of gallbladder without cholecystitis without obstruction: Secondary | ICD-10-CM | POA: Diagnosis not present

## 2024-06-25 DIAGNOSIS — K7200 Acute and subacute hepatic failure without coma: Secondary | ICD-10-CM | POA: Diagnosis not present

## 2024-06-25 DIAGNOSIS — K761 Chronic passive congestion of liver: Secondary | ICD-10-CM | POA: Diagnosis not present

## 2024-06-25 DIAGNOSIS — W19XXXA Unspecified fall, initial encounter: Secondary | ICD-10-CM

## 2024-06-25 DIAGNOSIS — Z95810 Presence of automatic (implantable) cardiac defibrillator: Secondary | ICD-10-CM | POA: Diagnosis not present

## 2024-06-25 DIAGNOSIS — K449 Diaphragmatic hernia without obstruction or gangrene: Secondary | ICD-10-CM | POA: Diagnosis present

## 2024-06-25 DIAGNOSIS — I739 Peripheral vascular disease, unspecified: Secondary | ICD-10-CM | POA: Diagnosis not present

## 2024-06-25 DIAGNOSIS — D62 Acute posthemorrhagic anemia: Secondary | ICD-10-CM | POA: Diagnosis present

## 2024-06-25 DIAGNOSIS — I959 Hypotension, unspecified: Secondary | ICD-10-CM | POA: Insufficient documentation

## 2024-06-25 DIAGNOSIS — N179 Acute kidney failure, unspecified: Secondary | ICD-10-CM | POA: Diagnosis not present

## 2024-06-25 DIAGNOSIS — I255 Ischemic cardiomyopathy: Secondary | ICD-10-CM | POA: Diagnosis present

## 2024-06-25 DIAGNOSIS — D6861 Antiphospholipid syndrome: Secondary | ICD-10-CM | POA: Diagnosis present

## 2024-06-25 DIAGNOSIS — I2510 Atherosclerotic heart disease of native coronary artery without angina pectoris: Secondary | ICD-10-CM | POA: Diagnosis present

## 2024-06-25 DIAGNOSIS — R918 Other nonspecific abnormal finding of lung field: Secondary | ICD-10-CM | POA: Diagnosis not present

## 2024-06-25 DIAGNOSIS — E782 Mixed hyperlipidemia: Secondary | ICD-10-CM | POA: Diagnosis present

## 2024-06-25 DIAGNOSIS — Z950 Presence of cardiac pacemaker: Secondary | ICD-10-CM | POA: Diagnosis not present

## 2024-06-25 DIAGNOSIS — I5022 Chronic systolic (congestive) heart failure: Secondary | ICD-10-CM | POA: Diagnosis not present

## 2024-06-25 DIAGNOSIS — I5042 Chronic combined systolic (congestive) and diastolic (congestive) heart failure: Secondary | ICD-10-CM | POA: Diagnosis not present

## 2024-06-25 DIAGNOSIS — R570 Cardiogenic shock: Secondary | ICD-10-CM | POA: Diagnosis not present

## 2024-06-25 DIAGNOSIS — E669 Obesity, unspecified: Secondary | ICD-10-CM | POA: Diagnosis present

## 2024-06-25 DIAGNOSIS — E8809 Other disorders of plasma-protein metabolism, not elsewhere classified: Secondary | ICD-10-CM | POA: Diagnosis present

## 2024-06-25 DIAGNOSIS — S2020XA Contusion of thorax, unspecified, initial encounter: Secondary | ICD-10-CM | POA: Diagnosis not present

## 2024-06-25 DIAGNOSIS — J9600 Acute respiratory failure, unspecified whether with hypoxia or hypercapnia: Secondary | ICD-10-CM | POA: Diagnosis not present

## 2024-06-25 DIAGNOSIS — E869 Volume depletion, unspecified: Secondary | ICD-10-CM | POA: Diagnosis present

## 2024-06-25 DIAGNOSIS — E440 Moderate protein-calorie malnutrition: Secondary | ICD-10-CM | POA: Diagnosis present

## 2024-06-25 DIAGNOSIS — R791 Abnormal coagulation profile: Secondary | ICD-10-CM | POA: Diagnosis not present

## 2024-06-25 DIAGNOSIS — H539 Unspecified visual disturbance: Secondary | ICD-10-CM | POA: Diagnosis not present

## 2024-06-25 DIAGNOSIS — R339 Retention of urine, unspecified: Secondary | ICD-10-CM | POA: Diagnosis not present

## 2024-06-25 DIAGNOSIS — Z8701 Personal history of pneumonia (recurrent): Secondary | ICD-10-CM

## 2024-06-25 DIAGNOSIS — Z800 Family history of malignant neoplasm of digestive organs: Secondary | ICD-10-CM

## 2024-06-25 DIAGNOSIS — Z7985 Long-term (current) use of injectable non-insulin antidiabetic drugs: Secondary | ICD-10-CM

## 2024-06-25 DIAGNOSIS — K219 Gastro-esophageal reflux disease without esophagitis: Secondary | ICD-10-CM | POA: Diagnosis present

## 2024-06-25 DIAGNOSIS — R109 Unspecified abdominal pain: Secondary | ICD-10-CM | POA: Diagnosis not present

## 2024-06-25 DIAGNOSIS — E46 Unspecified protein-calorie malnutrition: Secondary | ICD-10-CM | POA: Insufficient documentation

## 2024-06-25 DIAGNOSIS — R6521 Severe sepsis with septic shock: Secondary | ICD-10-CM | POA: Diagnosis present

## 2024-06-25 DIAGNOSIS — Z6835 Body mass index (BMI) 35.0-35.9, adult: Secondary | ICD-10-CM

## 2024-06-25 DIAGNOSIS — I5043 Acute on chronic combined systolic (congestive) and diastolic (congestive) heart failure: Secondary | ICD-10-CM | POA: Diagnosis not present

## 2024-06-25 DIAGNOSIS — A419 Sepsis, unspecified organism: Principal | ICD-10-CM | POA: Diagnosis present

## 2024-06-25 DIAGNOSIS — I10 Essential (primary) hypertension: Secondary | ICD-10-CM | POA: Diagnosis not present

## 2024-06-25 DIAGNOSIS — R1011 Right upper quadrant pain: Secondary | ICD-10-CM | POA: Diagnosis not present

## 2024-06-25 DIAGNOSIS — Z7902 Long term (current) use of antithrombotics/antiplatelets: Secondary | ICD-10-CM

## 2024-06-25 DIAGNOSIS — E875 Hyperkalemia: Secondary | ICD-10-CM | POA: Diagnosis not present

## 2024-06-25 DIAGNOSIS — R0989 Other specified symptoms and signs involving the circulatory and respiratory systems: Secondary | ICD-10-CM | POA: Diagnosis not present

## 2024-06-25 DIAGNOSIS — I509 Heart failure, unspecified: Secondary | ICD-10-CM | POA: Diagnosis not present

## 2024-06-25 DIAGNOSIS — R1310 Dysphagia, unspecified: Secondary | ICD-10-CM | POA: Diagnosis not present

## 2024-06-25 DIAGNOSIS — K5900 Constipation, unspecified: Secondary | ICD-10-CM | POA: Diagnosis not present

## 2024-06-25 DIAGNOSIS — E871 Hypo-osmolality and hyponatremia: Secondary | ICD-10-CM | POA: Diagnosis present

## 2024-06-25 DIAGNOSIS — S301XXD Contusion of abdominal wall, subsequent encounter: Secondary | ICD-10-CM | POA: Diagnosis not present

## 2024-06-25 DIAGNOSIS — K760 Fatty (change of) liver, not elsewhere classified: Secondary | ICD-10-CM | POA: Diagnosis not present

## 2024-06-25 DIAGNOSIS — S301XXA Contusion of abdominal wall, initial encounter: Secondary | ICD-10-CM | POA: Diagnosis not present

## 2024-06-25 DIAGNOSIS — F17210 Nicotine dependence, cigarettes, uncomplicated: Secondary | ICD-10-CM | POA: Diagnosis present

## 2024-06-25 DIAGNOSIS — Z452 Encounter for adjustment and management of vascular access device: Secondary | ICD-10-CM | POA: Diagnosis not present

## 2024-06-25 DIAGNOSIS — I9589 Other hypotension: Secondary | ICD-10-CM | POA: Diagnosis not present

## 2024-06-25 DIAGNOSIS — N3289 Other specified disorders of bladder: Secondary | ICD-10-CM | POA: Diagnosis not present

## 2024-06-25 DIAGNOSIS — Y92009 Unspecified place in unspecified non-institutional (private) residence as the place of occurrence of the external cause: Secondary | ICD-10-CM

## 2024-06-25 DIAGNOSIS — F1411 Cocaine abuse, in remission: Secondary | ICD-10-CM | POA: Diagnosis present

## 2024-06-25 DIAGNOSIS — Z8249 Family history of ischemic heart disease and other diseases of the circulatory system: Secondary | ICD-10-CM

## 2024-06-25 DIAGNOSIS — I5040 Unspecified combined systolic (congestive) and diastolic (congestive) heart failure: Secondary | ICD-10-CM | POA: Diagnosis not present

## 2024-06-25 DIAGNOSIS — K7682 Hepatic encephalopathy: Secondary | ICD-10-CM | POA: Diagnosis present

## 2024-06-25 DIAGNOSIS — Z8349 Family history of other endocrine, nutritional and metabolic diseases: Secondary | ICD-10-CM

## 2024-06-25 DIAGNOSIS — E861 Hypovolemia: Secondary | ICD-10-CM | POA: Diagnosis not present

## 2024-06-25 DIAGNOSIS — R0609 Other forms of dyspnea: Secondary | ICD-10-CM | POA: Diagnosis not present

## 2024-06-25 DIAGNOSIS — I480 Paroxysmal atrial fibrillation: Secondary | ICD-10-CM | POA: Diagnosis not present

## 2024-06-25 DIAGNOSIS — I25119 Atherosclerotic heart disease of native coronary artery with unspecified angina pectoris: Secondary | ICD-10-CM | POA: Diagnosis not present

## 2024-06-25 DIAGNOSIS — R110 Nausea: Secondary | ICD-10-CM | POA: Diagnosis not present

## 2024-06-25 DIAGNOSIS — Z79899 Other long term (current) drug therapy: Secondary | ICD-10-CM

## 2024-06-25 DIAGNOSIS — Z955 Presence of coronary angioplasty implant and graft: Secondary | ICD-10-CM

## 2024-06-25 DIAGNOSIS — H5704 Mydriasis: Secondary | ICD-10-CM | POA: Diagnosis present

## 2024-06-25 DIAGNOSIS — I7300 Raynaud's syndrome without gangrene: Secondary | ICD-10-CM | POA: Diagnosis present

## 2024-06-25 DIAGNOSIS — D689 Coagulation defect, unspecified: Secondary | ICD-10-CM

## 2024-06-25 DIAGNOSIS — I252 Old myocardial infarction: Secondary | ICD-10-CM

## 2024-06-25 DIAGNOSIS — R1084 Generalized abdominal pain: Secondary | ICD-10-CM | POA: Diagnosis not present

## 2024-06-25 DIAGNOSIS — I700 Atherosclerosis of aorta: Secondary | ICD-10-CM | POA: Diagnosis not present

## 2024-06-25 DIAGNOSIS — R609 Edema, unspecified: Secondary | ICD-10-CM | POA: Diagnosis not present

## 2024-06-25 DIAGNOSIS — I110 Hypertensive heart disease with heart failure: Secondary | ICD-10-CM | POA: Diagnosis present

## 2024-06-25 DIAGNOSIS — R531 Weakness: Secondary | ICD-10-CM | POA: Diagnosis not present

## 2024-06-25 DIAGNOSIS — J90 Pleural effusion, not elsewhere classified: Secondary | ICD-10-CM | POA: Diagnosis not present

## 2024-06-25 DIAGNOSIS — S3991XA Unspecified injury of abdomen, initial encounter: Secondary | ICD-10-CM | POA: Diagnosis not present

## 2024-06-25 DIAGNOSIS — I5023 Acute on chronic systolic (congestive) heart failure: Secondary | ICD-10-CM | POA: Diagnosis not present

## 2024-06-25 DIAGNOSIS — W19XXXS Unspecified fall, sequela: Secondary | ICD-10-CM | POA: Diagnosis not present

## 2024-06-25 DIAGNOSIS — N170 Acute kidney failure with tubular necrosis: Secondary | ICD-10-CM | POA: Diagnosis present

## 2024-06-25 DIAGNOSIS — W010XXD Fall on same level from slipping, tripping and stumbling without subsequent striking against object, subsequent encounter: Secondary | ICD-10-CM | POA: Diagnosis present

## 2024-06-25 DIAGNOSIS — Z7901 Long term (current) use of anticoagulants: Secondary | ICD-10-CM

## 2024-06-25 DIAGNOSIS — S299XXA Unspecified injury of thorax, initial encounter: Secondary | ICD-10-CM | POA: Diagnosis not present

## 2024-06-25 DIAGNOSIS — R569 Unspecified convulsions: Secondary | ICD-10-CM | POA: Diagnosis not present

## 2024-06-25 DIAGNOSIS — Z8049 Family history of malignant neoplasm of other genital organs: Secondary | ICD-10-CM

## 2024-06-25 DIAGNOSIS — R296 Repeated falls: Secondary | ICD-10-CM | POA: Diagnosis present

## 2024-06-25 DIAGNOSIS — K828 Other specified diseases of gallbladder: Secondary | ICD-10-CM | POA: Diagnosis not present

## 2024-06-25 DIAGNOSIS — Z4682 Encounter for fitting and adjustment of non-vascular catheter: Secondary | ICD-10-CM | POA: Diagnosis not present

## 2024-06-25 DIAGNOSIS — J9811 Atelectasis: Secondary | ICD-10-CM | POA: Diagnosis not present

## 2024-06-25 DIAGNOSIS — R7989 Other specified abnormal findings of blood chemistry: Secondary | ICD-10-CM | POA: Diagnosis not present

## 2024-06-25 DIAGNOSIS — R0602 Shortness of breath: Secondary | ICD-10-CM | POA: Diagnosis not present

## 2024-06-25 DIAGNOSIS — R112 Nausea with vomiting, unspecified: Secondary | ICD-10-CM | POA: Diagnosis not present

## 2024-06-25 DIAGNOSIS — S3993XA Unspecified injury of pelvis, initial encounter: Secondary | ICD-10-CM | POA: Diagnosis not present

## 2024-06-25 DIAGNOSIS — I4819 Other persistent atrial fibrillation: Secondary | ICD-10-CM | POA: Diagnosis present

## 2024-06-25 DIAGNOSIS — S52502D Unspecified fracture of the lower end of left radius, subsequent encounter for closed fracture with routine healing: Secondary | ICD-10-CM

## 2024-06-25 DIAGNOSIS — Z880 Allergy status to penicillin: Secondary | ICD-10-CM

## 2024-06-25 DIAGNOSIS — I517 Cardiomegaly: Secondary | ICD-10-CM | POA: Diagnosis not present

## 2024-06-25 DIAGNOSIS — Z8673 Personal history of transient ischemic attack (TIA), and cerebral infarction without residual deficits: Secondary | ICD-10-CM

## 2024-06-25 LAB — COMPREHENSIVE METABOLIC PANEL WITH GFR
ALT: 47 U/L — ABNORMAL HIGH (ref 0–44)
AST: 43 U/L — ABNORMAL HIGH (ref 15–41)
Albumin: 3.3 g/dL — ABNORMAL LOW (ref 3.5–5.0)
Alkaline Phosphatase: 92 U/L (ref 38–126)
Anion gap: 16 — ABNORMAL HIGH (ref 5–15)
BUN: 18 mg/dL (ref 8–23)
CO2: 23 mmol/L (ref 22–32)
Calcium: 8.8 mg/dL — ABNORMAL LOW (ref 8.9–10.3)
Chloride: 95 mmol/L — ABNORMAL LOW (ref 98–111)
Creatinine, Ser: 2.05 mg/dL — ABNORMAL HIGH (ref 0.44–1.00)
GFR, Estimated: 27 mL/min — ABNORMAL LOW (ref >60)
Glucose, Bld: 126 mg/dL — ABNORMAL HIGH (ref 70–99)
Potassium: 4.4 mmol/L (ref 3.5–5.1)
Sodium: 134 mmol/L — ABNORMAL LOW (ref 135–145)
Total Bilirubin: 0.9 mg/dL (ref 0.0–1.2)
Total Protein: 7 g/dL (ref 6.5–8.1)

## 2024-06-25 LAB — CBC WITH DIFFERENTIAL/PLATELET
Abs Immature Granulocytes: 0.08 10*3/uL — ABNORMAL HIGH (ref 0.00–0.07)
Basophils Absolute: 0.1 10*3/uL (ref 0.0–0.1)
Basophils Relative: 1 %
Eosinophils Absolute: 0 10*3/uL (ref 0.0–0.5)
Eosinophils Relative: 0 %
HCT: 42.5 % (ref 36.0–46.0)
Hemoglobin: 14.3 g/dL (ref 12.0–15.0)
Immature Granulocytes: 1 %
Lymphocytes Relative: 10 %
Lymphs Abs: 1.2 10*3/uL (ref 0.7–4.0)
MCH: 31.8 pg (ref 26.0–34.0)
MCHC: 33.6 g/dL (ref 30.0–36.0)
MCV: 94.7 fL (ref 80.0–100.0)
Monocytes Absolute: 0.9 10*3/uL (ref 0.1–1.0)
Monocytes Relative: 8 %
Neutro Abs: 9.6 10*3/uL — ABNORMAL HIGH (ref 1.7–7.7)
Neutrophils Relative %: 80 %
Platelets: 187 10*3/uL (ref 150–400)
RBC: 4.49 MIL/uL (ref 3.87–5.11)
RDW: 13.2 % (ref 11.5–15.5)
WBC: 11.9 10*3/uL — ABNORMAL HIGH (ref 4.0–10.5)
nRBC: 0 % (ref 0.0–0.2)

## 2024-06-25 LAB — PROTIME-INR
INR: 6.3 (ref 0.8–1.2)
Prothrombin Time: 57.8 s — ABNORMAL HIGH (ref 11.4–15.2)

## 2024-06-25 MED ORDER — SODIUM CHLORIDE 0.9 % IV BOLUS
500.0000 mL | Freq: Once | INTRAVENOUS | Status: AC
  Administered 2024-06-25: 500 mL via INTRAVENOUS

## 2024-06-25 MED ORDER — LACTATED RINGERS IV BOLUS
500.0000 mL | Freq: Once | INTRAVENOUS | Status: AC
  Administered 2024-06-26: 500 mL via INTRAVENOUS

## 2024-06-25 MED ORDER — IOHEXOL 300 MG/ML  SOLN
80.0000 mL | Freq: Once | INTRAMUSCULAR | Status: AC | PRN
  Administered 2024-06-25: 80 mL via INTRAVENOUS

## 2024-06-25 NOTE — ED Notes (Signed)
 Istat isn't crossing over  Na- 132 K- 5.2 Cl- 97 iCa- 0.95 TCO2- 24 Glu- 126 BUN- 23 Crea- 2.1 Hct- 44 Hb- 15.0 AnGap- 18

## 2024-06-25 NOTE — ED Provider Notes (Signed)
 [place] EMERGENCY DEPARTMENT AT [facility] Provider Note   CSN: [id number] Arrival date & time: 06/25/24  2140     Patient presents with: [name] [name] PAULLA MCCLASKEY is a 63 y.o. female.   HPI 63 year old female with a history of antiphospholipid syndrome on warfarin as well as a history of CHF with an ICD, A-fib, PVD, and other comorbidities presents with a fall and abdominal wall injury.  Patient states that she has been vomiting recently due to her Wegovy  which typically happens whenever she uses that.  She has not been able to keep anything down since yesterday.  Today she was walking with her walker at around 2 PM and the walker got out of control and then hit something causing it to tip over and her to fall.  She landed on the walker, striking her left abdomen.  She has had some slowly progressive ecchymosis to that area and called EMS to be evaluated for possible bleeding.  She did not hit her head, did not lose consciousness and does not have neck pain.  She is having a little bit of right rib pain.  No new weakness.  Patient tells me her blood pressure chronically runs low and often can run in the 60s or 70s.  Prior to Admission medications   Medication Sig Start Date End Date Taking? Authorizing Provider  amiodarone  (PACERONE ) 200 MG tablet TAKE 1 AND 1/2 TABLETS BY MOUTH MONDAY-THURSDAY. TAKE 2 TABLETS BY MOUTH FRIDAY-SUNDAY Patient taking differently: Take 100-200 mg by mouth See admin instructions. Take 200 mg every day at bedtime, Take 200 mg Friday, Saturday and Sunday morning, Then take 100 mg Monday - Thursday in morning. 11/10/23   Miriam Norris, NP  carvedilol  (COREG ) 12.5 MG tablet TAKE 1 TABLET BY MOUTH TWICE DAILY WITH A MEAL (NEED FOLLOWING UP APPOINTMENT FOR MORE REFILLS) 05/07/24   Rolan Ezra RAMAN, MD  DULoxetine  (CYMBALTA ) 60 MG capsule Take 1 capsule (60 mg total) by mouth daily. **NEEDS TO BE SEEN BEFORE NEXT REFILL** 06/07/24   Lavell Lye A, FNP   empagliflozin  (JARDIANCE ) 10 MG TABS tablet Take 1 tablet (10 mg total) by mouth daily before breakfast. 06/30/23   Debera Jayson MATSU, MD  enoxaparin  (LOVENOX ) 150 MG/ML injection Inject 1 mL (150 mg total) into the skin daily. 05/04/24   Debera Jayson MATSU, MD  EPINEPHRINE  0.3 mg/0.3 mL IJ SOAJ injection USE AS DIRECTED 05/02/22   Merlynn Niki FALCON, FNP  Evolocumab  (REPATHA  SURECLICK) 140 MG/ML SOAJ Inject 140 mg into the skin every 14 (fourteen) days. 04/15/24   Rolan Ezra RAMAN, MD  ezetimibe  (ZETIA ) 10 MG tablet TAKE 1 TABLET BY MOUTH EVERY DAY (patient needs office visit FOR further refills) 05/07/24   Rolan Ezra RAMAN, MD  fluticasone  (FLONASE ) 50 MCG/ACT nasal spray Place 1 spray into both nostrils as needed for allergies or rhinitis.    [provider]  isosorbide  mononitrate (IMDUR ) 30 MG 24 hr tablet Take 1 tablet (30 mg total) by mouth 2 (two) times daily. 02/23/24   Rolan Ezra RAMAN, MD  mexiletine (MEXITIL ) 150 MG capsule TAKE TWO CAPSULES BY MOUTH THREE TIMES DAILY 10/09/23   Waddell Danelle ORN, MD  Multiple Vitamins-Minerals (MULTIVITAMINS THER. W/MINERALS) TABS Take 1 tablet by mouth at bedtime.    [provider]  nitroGLYCERIN  (NITROSTAT ) 0.4 MG SL tablet PLACE 1 TABLET UNDER THE TONGUE EVERY 5 MINUTES FOR 3 DOSES AS NEEDED CHEST PAIN 09/19/23   Rolan Ezra RAMAN, MD  oxyCODONE -acetaminophen  (  PERCOCET) 10-325 MG tablet Take 1 tablet by mouth every 4 (four) hours as needed for pain.    [provider]  pantoprazole  (PROTONIX ) 40 MG tablet TAKE 1 TABLET BY MOUTH DAILY 03/10/24   McLean, Dalton S, MD  potassium chloride  SA (KLOR-CON  M) 20 MEQ tablet TAKE 2 TABLETS BY MOUTH EVERY MORNING and TAKE 1 TABLET EVERY EVENING 05/07/24   Rolan Ezra RAMAN, MD  pravastatin  (PRAVACHOL ) 20 MG tablet Take 20 mg by mouth 3 (three) times a week.    [provider]  promethazine  (PHENERGAN ) 25 MG tablet TAKE 1 TABLET BY MOUTH EVERY 8 HOURS AS NEEDED 06/22/24   Lavell Lye A, FNP   sacubitril -valsartan  (ENTRESTO ) 24-26 MG Take 1 tablet by mouth 2 (two) times daily. 07/16/23   Debera Jayson MATSU, MD  sennosides-docusate sodium  (SENOKOT-S) 8.6-50 MG tablet Take 1 tablet by mouth daily as needed.    [provider]  spironolactone  (ALDACTONE ) 25 MG tablet TAKE 1 TABLET BY MOUTH ONCE DAILY 03/08/24   Debera Jayson MATSU, MD  torsemide  (DEMADEX ) 20 MG tablet Take 2 tablets (40 mg total) by mouth 2 (two) times daily. 02/23/24   Rolan Ezra RAMAN, MD  traZODone  (DESYREL ) 100 MG tablet Take 1 tablet (100 mg total) by mouth at bedtime as needed for sleep. 12/09/23   Lavell Lye A, FNP  Vericiguat  (VERQUVO ) 5 MG TABS TAKE 1 TABLET BY MOUTH ONCE DAILY 01/07/24   Debera Jayson MATSU, MD  warfarin (COUMADIN ) 5 MG tablet TAKE ONE-HALF TO 1 TABLET BY MOUTH EVERY DAY OR USE AS DIRECTED by coumadin  clinic Patient taking differently: Take 2.5-5 mg by mouth See admin instructions. Take 5 mg on Mon., Wed., and Friday, all the other days take 2.5 mg in the evening  USE AS DIRECTED by coumadin  clinic 03/08/24   Debera Jayson MATSU, MD  WEGOVY  0.25 MG/0.5ML SOAJ INJECT 0.25 MG UNDER THE SKIN ONCE A WEEK 06/16/24   Rolan Ezra RAMAN, MD    Allergies: Beef-derived drug products, Metoclopramide hcl, Penicillins, Pork-derived products, Metoprolol, and Statins    Review of Systems  Respiratory:  Negative for shortness of breath.   Cardiovascular:  Positive for chest pain (right ribs).  Gastrointestinal:  Positive for abdominal pain and vomiting.    Updated Vital Signs BP (!) 72/57   Pulse 100   Temp 98.2 F (36.8 C) (Oral)   Resp 14   Ht 5' 5 (1.651 m)   Wt 88.5 kg   SpO2 96%   BMI 32.45 kg/m   Physical Exam Vitals and nursing note reviewed.  Constitutional:      Appearance: She is well-developed. She is not ill-appearing or diaphoretic.  HENT:     Head: Normocephalic and atraumatic.   Cardiovascular:     Rate and Rhythm: Regular rhythm. Tachycardia present.     Heart sounds:  Normal heart sounds.  Pulmonary:     Effort: Pulmonary effort is normal.     Breath sounds: Normal breath sounds. No wheezing, rhonchi or rales.  Abdominal:     Palpations: Abdomen is soft.     Tenderness: There is abdominal tenderness.   Musculoskeletal:     Right lower leg: No edema.     Left lower leg: No edema.   Skin:    General: Skin is warm and dry.   Neurological:     Mental Status: She is alert.     (all labs ordered are listed, but only abnormal results are displayed) Labs Reviewed  CBC WITH  DIFFERENTIAL/PLATELET  COMPREHENSIVE METABOLIC PANEL WITH GFR  PROTIME-INR  I-STAT CHEM 8, ED    EKG: EKG Interpretation Date/Time:  Friday June 25 2024 21:57:29 EDT Ventricular Rate:  90 PR Interval:  193 QRS Duration:  113 QT Interval:  383 QTC Calculation: 469 R Axis:   22  Text Interpretation: Sinus rhythm Atrial premature complex Incomplete left bundle branch block Low voltage, extremity leads Probable left ventricular hypertrophy  ST/T changes similar to Feb 2025 Confirmed by Freddi Hamilton [phone]) on 06/25/2024 10:06:51 PM  Radiology: No results found.   .Critical Care  Performed by: Freddi Hamilton, MD Authorized by: Freddi Hamilton, MD   Critical care provider statement:    Critical care time (minutes):  40   Critical care time was exclusive of:  Separately billable procedures and treating other patients   Critical care was necessary to treat or prevent imminent or life-threatening deterioration of the following conditions:  Trauma, shock and renal failure   Critical care was time spent personally by me on the following activities:  Development of treatment plan with patient or surrogate, discussions with consultants, evaluation of patient's response to treatment, examination of patient, ordering and review of laboratory studies, ordering and review of radiographic studies, ordering and performing treatments and interventions, pulse oximetry, re-evaluation of  patient's condition and review of old charts    Medications Ordered in the ED  sodium chloride  0.9 % bolus 500 mL (has no administration in time range)                                    Medical Decision Making Amount and/or Complexity of Data Reviewed Labs: ordered.    Details: Normal hemoglobin.  Acute on chronic kidney injury. Radiology: ordered.  Risk Prescription drug management.   Patient presents with abdominal swelling that is progressive since her injury earlier in the day.  While it is not rapidly progressing, the fact that she is on warfarin and is progressing is concerning.  She also appears to have an AKI based on her recent vomiting.  She has significant hypotension which has improved with fluids, may be from purely dehydration but cannot rule out this is from trauma.  I had an extensive discussion with the patient as she needs IV contrast to see if she is having active or internal bleeding.  Discussed that this contrast, even at reduced dose could cause kidney failure/injury.  She understands this and is okay with proceeding given concern for bleeding.  Care transferred to Dr. Geroldine.     Final diagnoses:  None    ED Discharge Orders     None          Freddi Hamilton, MD 06/25/24 [phone]

## 2024-06-25 NOTE — ED Triage Notes (Addendum)
 Pt A&Ox4 and ambulatory. Fall at home (tripped over walker), denies hitting head. Pt is on Warfarin.  Pt stated, I do have pain in my lower left stomach. Hematoma present after fall, tender, bruised, and swollen. Bruising to upper left chest and and under left arm.  Pt started wegovy  2 months ago stated that has been making her weak and nauseous. Pt had a recent fall 6 weeks ago breaking left arm. Bruising to lower legs pt stated are from the fall 6 weeks ago.  Pain only in rib cage and left lower abdomen at this time.   EMS: 92/70 , HR 80s, 95% RA, and 133 CBG.

## 2024-06-26 DIAGNOSIS — E440 Moderate protein-calorie malnutrition: Secondary | ICD-10-CM | POA: Diagnosis not present

## 2024-06-26 DIAGNOSIS — I959 Hypotension, unspecified: Secondary | ICD-10-CM | POA: Insufficient documentation

## 2024-06-26 DIAGNOSIS — Z7901 Long term (current) use of anticoagulants: Secondary | ICD-10-CM | POA: Diagnosis not present

## 2024-06-26 DIAGNOSIS — R791 Abnormal coagulation profile: Secondary | ICD-10-CM | POA: Diagnosis not present

## 2024-06-26 DIAGNOSIS — I5022 Chronic systolic (congestive) heart failure: Secondary | ICD-10-CM | POA: Diagnosis not present

## 2024-06-26 DIAGNOSIS — R4182 Altered mental status, unspecified: Secondary | ICD-10-CM | POA: Diagnosis not present

## 2024-06-26 DIAGNOSIS — W19XXXA Unspecified fall, initial encounter: Secondary | ICD-10-CM

## 2024-06-26 DIAGNOSIS — Z6835 Body mass index (BMI) 35.0-35.9, adult: Secondary | ICD-10-CM | POA: Diagnosis not present

## 2024-06-26 DIAGNOSIS — F1411 Cocaine abuse, in remission: Secondary | ICD-10-CM | POA: Diagnosis not present

## 2024-06-26 DIAGNOSIS — R6521 Severe sepsis with septic shock: Secondary | ICD-10-CM | POA: Diagnosis not present

## 2024-06-26 DIAGNOSIS — D6861 Antiphospholipid syndrome: Secondary | ICD-10-CM | POA: Diagnosis not present

## 2024-06-26 DIAGNOSIS — E871 Hypo-osmolality and hyponatremia: Secondary | ICD-10-CM | POA: Diagnosis not present

## 2024-06-26 DIAGNOSIS — E8809 Other disorders of plasma-protein metabolism, not elsewhere classified: Secondary | ICD-10-CM | POA: Diagnosis not present

## 2024-06-26 DIAGNOSIS — S301XXA Contusion of abdominal wall, initial encounter: Secondary | ICD-10-CM | POA: Diagnosis not present

## 2024-06-26 DIAGNOSIS — A419 Sepsis, unspecified organism: Secondary | ICD-10-CM | POA: Diagnosis not present

## 2024-06-26 DIAGNOSIS — K7682 Hepatic encephalopathy: Secondary | ICD-10-CM | POA: Diagnosis not present

## 2024-06-26 DIAGNOSIS — K219 Gastro-esophageal reflux disease without esophagitis: Secondary | ICD-10-CM

## 2024-06-26 DIAGNOSIS — R570 Cardiogenic shock: Secondary | ICD-10-CM | POA: Diagnosis not present

## 2024-06-26 DIAGNOSIS — I5040 Unspecified combined systolic (congestive) and diastolic (congestive) heart failure: Secondary | ICD-10-CM | POA: Diagnosis not present

## 2024-06-26 DIAGNOSIS — Y92009 Unspecified place in unspecified non-institutional (private) residence as the place of occurrence of the external cause: Secondary | ICD-10-CM

## 2024-06-26 DIAGNOSIS — E46 Unspecified protein-calorie malnutrition: Secondary | ICD-10-CM | POA: Insufficient documentation

## 2024-06-26 DIAGNOSIS — I255 Ischemic cardiomyopathy: Secondary | ICD-10-CM | POA: Diagnosis not present

## 2024-06-26 DIAGNOSIS — K761 Chronic passive congestion of liver: Secondary | ICD-10-CM | POA: Diagnosis not present

## 2024-06-26 DIAGNOSIS — E782 Mixed hyperlipidemia: Secondary | ICD-10-CM

## 2024-06-26 DIAGNOSIS — D689 Coagulation defect, unspecified: Secondary | ICD-10-CM | POA: Diagnosis present

## 2024-06-26 DIAGNOSIS — E875 Hyperkalemia: Secondary | ICD-10-CM | POA: Diagnosis not present

## 2024-06-26 DIAGNOSIS — S301XXD Contusion of abdominal wall, subsequent encounter: Secondary | ICD-10-CM | POA: Diagnosis not present

## 2024-06-26 DIAGNOSIS — I4819 Other persistent atrial fibrillation: Secondary | ICD-10-CM | POA: Diagnosis not present

## 2024-06-26 DIAGNOSIS — N170 Acute kidney failure with tubular necrosis: Secondary | ICD-10-CM | POA: Diagnosis not present

## 2024-06-26 DIAGNOSIS — I5043 Acute on chronic combined systolic (congestive) and diastolic (congestive) heart failure: Secondary | ICD-10-CM | POA: Diagnosis not present

## 2024-06-26 DIAGNOSIS — R569 Unspecified convulsions: Secondary | ICD-10-CM | POA: Diagnosis not present

## 2024-06-26 DIAGNOSIS — R7989 Other specified abnormal findings of blood chemistry: Secondary | ICD-10-CM | POA: Diagnosis not present

## 2024-06-26 DIAGNOSIS — R0609 Other forms of dyspnea: Secondary | ICD-10-CM | POA: Diagnosis not present

## 2024-06-26 DIAGNOSIS — E669 Obesity, unspecified: Secondary | ICD-10-CM | POA: Diagnosis not present

## 2024-06-26 DIAGNOSIS — D62 Acute posthemorrhagic anemia: Secondary | ICD-10-CM | POA: Diagnosis not present

## 2024-06-26 DIAGNOSIS — I509 Heart failure, unspecified: Secondary | ICD-10-CM | POA: Diagnosis not present

## 2024-06-26 DIAGNOSIS — I110 Hypertensive heart disease with heart failure: Secondary | ICD-10-CM | POA: Diagnosis not present

## 2024-06-26 DIAGNOSIS — Z95810 Presence of automatic (implantable) cardiac defibrillator: Secondary | ICD-10-CM | POA: Diagnosis not present

## 2024-06-26 DIAGNOSIS — E861 Hypovolemia: Secondary | ICD-10-CM | POA: Diagnosis not present

## 2024-06-26 DIAGNOSIS — K7200 Acute and subacute hepatic failure without coma: Secondary | ICD-10-CM | POA: Diagnosis not present

## 2024-06-26 DIAGNOSIS — I25119 Atherosclerotic heart disease of native coronary artery with unspecified angina pectoris: Secondary | ICD-10-CM | POA: Diagnosis not present

## 2024-06-26 DIAGNOSIS — E869 Volume depletion, unspecified: Secondary | ICD-10-CM | POA: Diagnosis not present

## 2024-06-26 DIAGNOSIS — I950 Idiopathic hypotension: Secondary | ICD-10-CM

## 2024-06-26 DIAGNOSIS — I5023 Acute on chronic systolic (congestive) heart failure: Secondary | ICD-10-CM | POA: Diagnosis not present

## 2024-06-26 DIAGNOSIS — W010XXD Fall on same level from slipping, tripping and stumbling without subsequent striking against object, subsequent encounter: Secondary | ICD-10-CM | POA: Diagnosis present

## 2024-06-26 DIAGNOSIS — I5042 Chronic combined systolic (congestive) and diastolic (congestive) heart failure: Secondary | ICD-10-CM | POA: Diagnosis not present

## 2024-06-26 DIAGNOSIS — W19XXXS Unspecified fall, sequela: Secondary | ICD-10-CM | POA: Diagnosis not present

## 2024-06-26 DIAGNOSIS — N179 Acute kidney failure, unspecified: Secondary | ICD-10-CM | POA: Diagnosis not present

## 2024-06-26 LAB — HEMOGLOBIN AND HEMATOCRIT, BLOOD
HCT: 40.9 % (ref 36.0–46.0)
HCT: 33.5 % — ABNORMAL LOW (ref 36.0–46.0)
HCT: 34.9 % — ABNORMAL LOW (ref 36.0–46.0)
Hemoglobin: 13.5 g/dL (ref 12.0–15.0)
Hemoglobin: 10.7 g/dL — ABNORMAL LOW (ref 12.0–15.0)
Hemoglobin: 11.5 g/dL — ABNORMAL LOW (ref 12.0–15.0)

## 2024-06-26 LAB — PROTIME-INR
INR: 3.4 — ABNORMAL HIGH (ref 0.8–1.2)
Prothrombin Time: 35.9 s — ABNORMAL HIGH (ref 11.4–15.2)

## 2024-06-26 LAB — MRSA NEXT GEN BY PCR, NASAL: MRSA by PCR Next Gen: NOT DETECTED

## 2024-06-26 MED ORDER — PANTOPRAZOLE SODIUM 40 MG PO TBEC
40.0000 mg | DELAYED_RELEASE_TABLET | Freq: Every day | ORAL | Status: DC
  Administered 2024-06-26 – 2024-07-02 (×7): 40 mg via ORAL
  Filled 2024-06-26 (×7): qty 1

## 2024-06-26 MED ORDER — OXYCODONE-ACETAMINOPHEN 5-325 MG PO TABS
2.0000 | ORAL_TABLET | Freq: Once | ORAL | Status: AC
  Administered 2024-06-26: 2 via ORAL
  Filled 2024-06-26: qty 2

## 2024-06-26 MED ORDER — PRAVASTATIN SODIUM 10 MG PO TABS: 20.0000 mg | ORAL_TABLET | ORAL | Status: DC

## 2024-06-26 MED ORDER — MEXILETINE HCL 150 MG PO CAPS
300.0000 mg | ORAL_CAPSULE | Freq: Three times a day (TID) | ORAL | Status: DC
  Administered 2024-06-26 – 2024-07-11 (×46): 300 mg via ORAL
  Filled 2024-06-26 (×52): qty 2

## 2024-06-26 MED ORDER — SODIUM CHLORIDE 0.9 % IV SOLN
250.0000 mL | INTRAVENOUS | Status: AC
  Administered 2024-06-26: 250 mL via INTRAVENOUS

## 2024-06-26 MED ORDER — OXYCODONE HCL 5 MG PO TABS
5.0000 mg | ORAL_TABLET | Freq: Three times a day (TID) | ORAL | Status: DC | PRN
  Administered 2024-06-26 – 2024-07-07 (×18): 5 mg via ORAL
  Filled 2024-06-26 (×19): qty 1

## 2024-06-26 MED ORDER — VERICIGUAT 5 MG PO TABS
1.0000 | ORAL_TABLET | Freq: Every day | ORAL | Status: DC
  Administered 2024-06-26 – 2024-07-04 (×9): 5 mg via ORAL
  Filled 2024-06-26 (×10): qty 1

## 2024-06-26 MED ORDER — CHLORHEXIDINE GLUCONATE CLOTH 2 % EX PADS
6.0000 | MEDICATED_PAD | Freq: Every day | CUTANEOUS | Status: DC
  Administered 2024-06-26 – 2024-07-11 (×12): 6 via TOPICAL

## 2024-06-26 MED ORDER — VITAMIN K1 10 MG/ML IJ SOLN
1.0000 mg | Freq: Once | INTRAVENOUS | Status: AC
  Administered 2024-06-26: 1 mg via INTRAVENOUS
  Filled 2024-06-26: qty 0.1

## 2024-06-26 MED ORDER — DULOXETINE HCL 60 MG PO CPEP
60.0000 mg | ORAL_CAPSULE | Freq: Every day | ORAL | Status: DC
  Administered 2024-06-26 – 2024-07-11 (×16): 60 mg via ORAL
  Filled 2024-06-26 (×16): qty 1

## 2024-06-26 MED ORDER — MIDODRINE HCL 5 MG PO TABS: 5.0000 mg | ORAL_TABLET | Freq: Three times a day (TID) | ORAL | Status: DC

## 2024-06-26 MED ORDER — OXYCODONE-ACETAMINOPHEN 10-325 MG PO TABS: 1.0000 | ORAL_TABLET | Freq: Three times a day (TID) | ORAL | Status: DC | PRN

## 2024-06-26 MED ORDER — EZETIMIBE 10 MG PO TABS
10.0000 mg | ORAL_TABLET | Freq: Every day | ORAL | Status: DC
  Administered 2024-06-26 – 2024-07-10 (×15): 10 mg via ORAL
  Filled 2024-06-26 (×18): qty 1

## 2024-06-26 MED ORDER — VITAMIN K1 10 MG/ML IJ SOLN
5.0000 mg | Freq: Once | INTRAVENOUS | Status: AC
  Administered 2024-06-26: 5 mg via INTRAVENOUS
  Filled 2024-06-26: qty 0.5

## 2024-06-26 MED ORDER — AMIODARONE HCL 200 MG PO TABS
200.0000 mg | ORAL_TABLET | ORAL | Status: DC
  Administered 2024-06-26 – 2024-07-10 (×14): 200 mg via ORAL
  Filled 2024-06-26 (×15): qty 1

## 2024-06-26 MED ORDER — SODIUM CHLORIDE 0.9 % IV BOLUS
500.0000 mL | Freq: Once | INTRAVENOUS | Status: AC
  Administered 2024-06-26: 500 mL via INTRAVENOUS

## 2024-06-26 MED ORDER — AMIODARONE HCL 200 MG PO TABS
200.0000 mg | ORAL_TABLET | ORAL | Status: DC
  Administered 2024-06-28 – 2024-07-08 (×8): 200 mg via ORAL
  Filled 2024-06-26 (×8): qty 1

## 2024-06-26 MED ORDER — OXYCODONE-ACETAMINOPHEN 5-325 MG PO TABS
1.0000 | ORAL_TABLET | Freq: Three times a day (TID) | ORAL | Status: DC | PRN
  Administered 2024-06-26 – 2024-07-07 (×22): 1 via ORAL
  Filled 2024-06-26 (×23): qty 1

## 2024-06-26 MED ORDER — AMIODARONE HCL 100 MG PO TABS
100.0000 mg | ORAL_TABLET | ORAL | Status: DC
  Administered 2024-06-28 – 2024-07-08 (×8): 100 mg via ORAL
  Filled 2024-06-26 (×8): qty 1

## 2024-06-26 MED ORDER — ONDANSETRON HCL 4 MG/2ML IJ SOLN
4.0000 mg | Freq: Four times a day (QID) | INTRAMUSCULAR | Status: DC | PRN
  Administered 2024-06-26 – 2024-07-07 (×25): 4 mg via INTRAVENOUS
  Filled 2024-06-26 (×26): qty 2

## 2024-06-26 MED ORDER — ENSURE PLUS HIGH PROTEIN PO LIQD
237.0000 mL | Freq: Two times a day (BID) | ORAL | Status: DC
  Administered 2024-06-26 – 2024-07-07 (×6): 237 mL via ORAL

## 2024-06-26 MED ORDER — NOREPINEPHRINE 4 MG/250ML-% IV SOLN
0.0000 ug/min | INTRAVENOUS | Status: DC
  Administered 2024-06-26: 4 ug/min via INTRAVENOUS
  Administered 2024-06-26: 2 ug/min via INTRAVENOUS
  Administered 2024-06-27: 4 ug/min via INTRAVENOUS
  Administered 2024-06-28: 8 ug/min via INTRAVENOUS
  Administered 2024-06-28: 7 ug/min via INTRAVENOUS
  Filled 2024-06-26 (×6): qty 250

## 2024-06-26 NOTE — ED Notes (Signed)
Abdominal binder applied at this time.

## 2024-06-26 NOTE — Evaluation (Signed)
 Physical Therapy Evaluation Patient Details Name: [name] [name] MRN: [id number] DOB: [birth year] Today's Date: 06/26/2024  History of Present Illness  : [name] [name] is a 63 y.o. female with medical history significant of antiphospholipid syndrome on warfarin, CHF, A-fib who presents to the emergency department after sustaining a fall and had an abdominal wall injury.  Patient states that she sustained a fall yesterday in the afternoon around 2 PM when her walker hit something resulting in her losing balance and fell landing on walker and striking left side of her abdomen with subsequent bruise to the area.  She denies hitting head or losing consciousness.  She decided to go to the ED for evaluation due to being on warfarin and being concerned of internal bleeding.  Clinical Impression  Pt reluctant to participate in therapy.  Therapist convinced pt that she was here due to falling and having decreased mobility and needed to work with therapist.  Pt able to complete all exercises in bed without difficulty to include SLR and bridging.  Pt very slow coming sit to stand.  Once standing pt arm starts twitching, then body and pt states she just can not try walking.  Therapist returned pt back to bed.  Pt then calls therapist back into room and states she wants to try but wants to try to go to the bathroom first.  PT comes from supine to sitting without difficulty without help and very quickly.  Sit to stand without assist. Transfers with hesitancy and slowly.  Approximately 30 seconds after transfer pt stops verbalizing stares straight ahead and starts twitching her body.  Nurse was called as soon as the nurse came into the room pt quit twitching and began conversing.  PT transferred back into bed with supervision only.  Pt appears to have the ability to ambulate but is to fearful to do so.         If plan is discharge home, recommend the following: A little help with walking and/or transfers;Help with  stairs or ramp for entrance;Assistance with cooking/housework;A lot of help with bathing/dressing/bathroom   Can travel by private vehicle    yes    Equipment Recommendations None recommended by PT  Recommendations for Other Services    OT   Functional Status Assessment Patient has had a recent decline in their functional status and demonstrates the ability to make significant improvements in function in a reasonable and predictable amount of time.     Precautions / Restrictions Precautions Precautions: None Restrictions Weight Bearing Restrictions Per Provider Order: No Other Position/Activity Restrictions: Pt has a cast on her Lt wrist from a previous fall but has had it on for 12 weeks.  When therapist questioned 12 weeks pt states that it is at her request as she is terrified that she is going to fall again.      Mobility  Bed Mobility Overal bed mobility: Independent             General bed mobility comments: first attmept pt very slow, second attempt pt was very fast    Transfers Overall transfer level: Needs assistance Equipment used: Rolling walker (2 wheels) Transfers: Sit to/from Stand, Bed to chair/wheelchair/BSC Sit to Stand: Supervision   Step pivot transfers: Supervision       General transfer comment: Therapist sat on the commode; stopped verbalizing, stared in space and started shaking.  Therapist requested pt to look at her but pt continued to stare in space.  Nurse called pt recovered and  assisted back to bed with supervision.    Ambulation/Gait     Assistive device: Rolling walker (2 wheels) Gait Pattern/deviations: Decreased step length - right, Decreased step length - left                Pertinent Vitals/Pain Pain Assessment Pain Assessment: 0-10 Pain Score: 3  Pain Location: abdominal area Pain Descriptors / Indicators: Sore Pain Intervention(s): Premedicated before session    Home Living Family/patient expects to be  discharged to:: Private residence Living Arrangements: Alone Available Help at Discharge: Friend(s) Type of Home: Mobile home Home Access: Stairs to enter Entrance Stairs-Rails: Right Entrance Stairs-Number of Steps: 3   Home Layout: One level   Additional Comments: platform walker; pt planning on getting a ramp to enter her home    Prior Function Prior Level of Function : Independent/Modified Independent             Mobility Comments: Pt was ambulating with a platform walker       Extremity/Trunk Assessment        Lower Extremity Assessment Lower Extremity Assessment: Overall WFL for tasks assessed       Communication   Communication Communication: No apparent difficulties    Cognition Arousal: Alert     PT - Cognitive impairments: No apparent impairments                         Following commands: Intact       Cueing Cueing Techniques: Verbal cues     General Comments      Exercises General Exercises - Lower Extremity Ankle Circles/Pumps: Both, 10 reps Quad Sets: Both, 10 reps Heel Slides: Both, 5 reps Hip ABduction/ADduction: Both, 5 reps Straight Leg Raises: Both, 5 reps Mini-Sqauts:  (bridges x 5)   Assessment/Plan    PT Assessment Patient needs continued PT services  PT Problem List Decreased strength;Decreased balance;Decreased activity tolerance       PT Treatment Interventions Gait training;Functional mobility training;Therapeutic exercise;Stair training    PT Goals (Current goals can be found in the Care Plan section)  Acute Rehab PT Goals Patient Stated Goal: To walk with confidence PT Goal Formulation: With patient Time For Goal Achievement: 06/30/24 Potential to Achieve Goals: Good    Frequency Min 4X/week        AM-PAC PT 6 Clicks Mobility  Outcome Measure Help needed turning from your back to your side while in a flat bed without using bedrails?: None Help needed moving from lying on your back to sitting  on the side of a flat bed without using bedrails?: None Help needed moving to and from a bed to a chair (including a wheelchair)?: A Little Help needed standing up from a chair using your arms (e.g., wheelchair or bedside chair)?: A Little Help needed to walk in hospital room?: A Little Help needed climbing 3-5 steps with a railing? : A Lot 6 Click Score: 19    End of Session Equipment Utilized During Treatment: Gait belt Activity Tolerance: Other (comment) (high anxiety) Patient left: in bed;with call bell/phone within reach;with nursing/sitter in room Nurse Communication: Mobility status PT Visit Diagnosis: Unsteadiness on feet (R26.81);Repeated falls (R29.6);Muscle weakness (generalized) (M62.81)    Time: 1100-1140 PT Time Calculation (min) (ACUTE ONLY): 40 min   Charges:   PT Evaluation $PT Eval Moderate Complexity: 1 Mod PT Treatments $Therapeutic Exercise: 8-22 mins $Therapeutic Activity: 8-22 mins PT General Charges $$ ACUTE PT VISIT: 1 Visit  Montie Metro, PT CLT [phone]  06/26/2024, 11:41 AM

## 2024-06-26 NOTE — Plan of Care (Signed)
   Problem: Education: Goal: Knowledge of General Education information will improve Description: Including pain rating scale, medication(s)/side effects and non-pharmacologic comfort measures Outcome: Progressing   Problem: Activity: Goal: Risk for activity intolerance will decrease Outcome: Progressing   Problem: Nutrition: Goal: Adequate nutrition will be maintained Outcome: Progressing

## 2024-06-26 NOTE — Progress Notes (Signed)
 Patient seen and evaluated, chart reviewed, please see EMR for updated orders. Please see full H&P dictated by admitting physician Dr Adefeso for same date of service.   Brief Summary:- 64 y.o. female with medical history significant of antiphospholipid syndrome on warfarin, CHF, A-fib admitted on 06/26/2024 with abdominal wall hematoma in setting of supratherapeutic INR and follow home  A/p 1)Persistent Hypotension--suspect this is hypovolemic due to acute blood loss in the setting of abdominal wall hematoma due to follow home patient with supratherapeutic INR --Currently requiring IV Levophed for pressure support -- Hold PTA Coreg , Aldactone  and isosorbide  due to hypotension  2)Abdominal wall hematoma--- noticed post fall at home in the setting of supratherapeutic INR Abdominal binder was provided Continue to monitor patient  3) acute blood loss anemia due to #2 above-- Hgb 14.3 >>13.5 >>10.7 >>11.5 - Monitor H&H and transfuse as clinically indicated   4)Supratherapeutic INR--in the setting of Coumadin  therapy for Antiphospholipid syndrome INR 6.3 >>3.4- -will repeat vitamin K --reports poor oral intake over the last few weeks due to Wegovy  therapy--poor oral  intake probably exacerbated her supratherapeutic INR associated with lack of vitamin K intake   5)Fall at home Continue fall precaution Continue PT/OT eval and treat   6)GERD Continue Protonix    7)Chronic systolic CHF Coreg , Entresto , isosorbide  spironolactone , will be held at this time due to soft BP   Mixed hyperlipidemia Continue Zetia  Statin will be temporarily held due to mild transaminitis She takes Repatha  every 14 days    CRITICAL CARE Performed by: Rendall Carwin   Total critical care time: 47 minutes  Critical care time was exclusive of separately billable procedures and treating other patients. - Persistent hypotension despite IV fluids due to acute blood loss anemia requiring IV Levophed for pressure  support  Critical care was necessary to treat or prevent imminent or life-threatening deterioration.  Critical care was time spent personally by me on the following activities: development of treatment plan with patient and/or surrogate as well as nursing, discussions with consultants, evaluation of patient's response to treatment, examination of patient, obtaining history from patient or surrogate, ordering and performing treatments and interventions, ordering and review of laboratory studies, ordering and review of radiographic studies, pulse oximetry and re-evaluation of patient's condition.  Rendall Carwin, MD

## 2024-06-26 NOTE — Plan of Care (Signed)
  Problem: Education: Goal: Knowledge of General Education information will improve Description: Including pain rating scale, medication(s)/side effects and non-pharmacologic comfort measures Outcome: Progressing   Problem: Health Behavior/Discharge Planning: Goal: Ability to manage health-related needs will improve Outcome: Progressing   Problem: Clinical Measurements: Goal: Ability to maintain clinical measurements within normal limits will improve Outcome: Progressing Goal: Will remain free from infection Outcome: Progressing Goal: Diagnostic test results will improve Outcome: Progressing Goal: Respiratory complications will improve Outcome: Progressing Goal: Cardiovascular complication will be avoided Outcome: Progressing   Problem: Activity: Goal: Risk for activity intolerance will decrease Outcome: Progressing   Problem: Nutrition: Goal: Adequate nutrition will be maintained Outcome: Progressing   Problem: Elimination: Goal: Will not experience complications related to bowel motility Outcome: Progressing Goal: Will not experience complications related to urinary retention Outcome: Progressing   Problem: Pain Managment: Goal: General experience of comfort will improve and/or be controlled Outcome: Progressing

## 2024-06-26 NOTE — Progress Notes (Signed)
   06/26/24 1705  TOC Brief Assessment  Insurance and Status Reviewed  Patient has primary care physician Yes  Home environment has been reviewed From home  Prior level of function: Independent  Prior/Current Home Services No current home services  Social Drivers of Health Review SDOH reviewed interventions complete (smoking cessation added to AVS)  Readmission risk has been reviewed Yes  Transition of care needs transition of care needs identified, TOC will continue to follow

## 2024-06-26 NOTE — ED Provider Notes (Signed)
  Physical Exam  BP 96/73   Pulse 72   Temp 98.2 F (36.8 C) (Oral)   Resp 18   Ht 5' 5 (1.651 m)   Wt 88.5 kg   SpO2 91%   BMI 32.45 kg/m   Physical Exam Vitals and nursing note reviewed.  Constitutional:      General: She is not in acute distress.    Appearance: She is well-developed. She is not diaphoretic.  HENT:     Head: Normocephalic and atraumatic.   Cardiovascular:     Rate and Rhythm: Normal rate and regular rhythm.     Heart sounds: No murmur heard.    No friction rub. No gallop.  Pulmonary:     Effort: Pulmonary effort is normal. No respiratory distress.     Breath sounds: Normal breath sounds. No wheezing.  Abdominal:     General: Bowel sounds are normal. There is no distension.     Palpations: Abdomen is soft.     Tenderness: There is no abdominal tenderness.     Comments: To the left side of the abdomen, there is a large, football sized hematoma noted.   Musculoskeletal:        General: Normal range of motion.     Cervical back: Normal range of motion and neck supple.   Skin:    General: Skin is warm and dry.   Neurological:     General: No focal deficit present.     Mental Status: She is alert and oriented to person, place, and time.     Procedures  Procedures  ED Course / MDM    Medical Decision Making Amount and/or Complexity of Data Reviewed Labs: ordered. Radiology: ordered.  Risk Prescription drug management. Decision regarding hospitalization.   Patient is a 63 year old female presenting after a fall.  She is taking Coumadin  due to her history of antiphospholipid syndrome.  She has a large hematoma to the left lower abdomen.  Initial hemoglobin was 14.3 and INR found to be 6.3, then care signed out to me awaiting CT scan to evaluate the extent of the hematoma.  CT scan has resulted and shows no intraperitoneal hemorrhage and this appears isolated to the abdominal wall.  There are findings that are concerning for possible active  bleeding.  Patient was given vitamin K.  Care was discussed with Dr. Ann from trauma surgery.  She feels well and abdominal binder, correction of her coagulopathy, and trending of the hemoglobin is all that is indicated.  I have discussed this with the hospitalist who agrees to admit.       Geroldine Berg, MD 06/26/24 [phone]

## 2024-06-26 NOTE — Progress Notes (Signed)
   06/26/24 1705  TOC Brief Assessment  Insurance and Status Reviewed  Patient has primary care physician Yes  Home environment has been reviewed From home  Prior level of function: Independent  Prior/Current Home Services No current home services  Social Drivers of Health Review SDOH reviewed interventions complete (smoking cessation added to AVS)  Readmission risk has been reviewed Yes  Transition of care needs transition of care needs identified, TOC will continue to follow   PT recommending HHPT.

## 2024-06-26 NOTE — Progress Notes (Signed)
 Bladder scan performed with result of . Patient unable to urinate at this time. Dr Rendall aware with verbal order to continue to monitor.

## 2024-06-26 NOTE — ED Notes (Signed)
 Provider at bedside

## 2024-06-26 NOTE — H&P (Addendum)
 History and Physical    Patient: [name] [name] FMW:[id number] DOB: 1961-05-03 DOA: 06/25/2024 DOS: the patient was seen and examined on 06/26/2024 PCP: Lavell Bari LABOR, FNP  Patient coming from: Home  Chief Complaint:  Chief Complaint  Patient presents with   Fall   HPI: [name] [name] is a 63 y.o. female with medical history significant of antiphospholipid syndrome on warfarin, CHF, A-fib who presents to the emergency department after sustaining a fall and had an abdominal wall injury.  Patient states that she sustained a fall yesterday in the afternoon around 2 PM when her walker hit something resulting in her losing balance and fell landing on walker and striking left side of her abdomen with subsequent bruise to the area.  She denies hitting head or losing consciousness.  She decided to go to the ED for evaluation due to being on warfarin and being concerned of internal bleeding. Patient also complained of nausea and vomiting since she recently started Wegovy .  She had difficulty in being able to keep any food down.  ED Course:  In the emergency department, BP was in hypotensive range at 67/57 (patient endorsed chronic soft BP with SBP's in 80s to 90s).  Other vital signs were within normal range.  Workup in the ED showed normal CBC except for WBC of 11.9.  BMP showed sodium 134, potassium 4.4, chloride 95, bicarb 23, blood glucose 126, BUN 18, creatinine 2.05, albumin  3.3, AST 23, ALT 47, GFR 27, anion gap 16, INR 6.3 CT chest, abdomen and pelvis with contrast showed Soft tissue contusion and large hematoma in the anterolateral left abdominal wall measuring 9.4 x 6.7 x 17.5 cm 2.7 x 2.3 cm fluid collection in the lateral left thigh with mild peripheral enhancement.  No evidence of acute traumatic injury within the chest Trauma surgeon on-call (Dr. Ann) was consulted and recommended that patient can stay AP since there is no indication for any surgical intervention.  Abdominal binding,  warfarin reversal and monitoring of H/H was recommended per EDP.  IV hydration was provided, Percocet was given, vitamin K was given.  Review of Systems: Review of systems as noted in the HPI. All other systems reviewed and are negative.   Past Medical History:  Diagnosis Date   AICD (automatic cardioverter/defibrillator) present    Aneurysm of internal carotid artery    Anti-phospholipid syndrome (HCC)    Anticardiolipin antibody positive    Chronic Coumadin    Brain aneurysm    followed by Dr. Dolphus   Chronic combined systolic (congestive) and diastolic (congestive) heart failure (HCC)    Cocaine abuse in remission [facility])    Coronary atherosclerosis of native coronary artery    Previous invasive cardiac testing was done at a facility in [place], [place].  Occluded diagonal, Diffuse LAD disease, Occluded Cx, and non obstructive RCA.    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 Malcolm L1 cage, 5 disc fusion   CARDIAC DEFIBRILLATOR PLACEMENT  2008   [employer] ICD   CARDIOVERSION N/A 12/01/2020   Procedure: CARDIOVERSION;  Surgeon: Rolan Ezra RAMAN, MD;  Location: [facility] ENDOSCOPY;  Service: Cardiovascular;  Laterality: N/A;   CORONARY STENT INTERVENTION N/A  01/08/2021   Procedure: CORONARY STENT INTERVENTION;  Surgeon: [place]az[place], Peter M, MD;  Location: [facility] INVASIVE CV LAB;  Service: Cardiovascular;  Laterality: N/A;   EP IMPLANTABLE DEVICE N/A 03/01/2016   Procedure: ICD Generator Changeout;  Surgeon: Danelle LELON Birmingham, MD;  Location: [facility] INVASIVE CV LAB;  Service: Cardiovascular;  Laterality: N/A;   ICD/BIV ICD FUNCTION(DFT) TEST N/A 03/14/2021   Procedure: ICD/BIV ICD FUNCTION (DFT) TEST;  Surgeon: Birmingham Danelle LELON, MD;  Location: [facility] INVASIVE CV LAB;  Service: Cardiovascular;  Laterality: N/A;   IR ANGIO INTRA EXTRACRAN SEL COM CAROTID INNOMINATE BILAT MOD SED  05/29/2023   IR ANGIO VERTEBRAL SEL SUBCLAVIAN INNOMINATE BILAT MOD SED  05/29/2023   IR RADIOLOGIST EVAL & MGMT  04/16/2023   IR US  GUIDE VASC ACCESS RIGHT  05/29/2023   L1 corpectomy   11/29/2009   Laryngeal polyp excision     OPEN REDUCTION INTERNAL FIXATION (ORIF) DISTAL RADIAL FRACTURE Left 05/11/2024   Procedure: OPEN REDUCTION INTERNAL FIXATION (ORIF) DISTAL RADIUS FRACTURE;  Surgeon: Alyse Agent, MD;  Location: [facility] OR;  Service: Orthopedics;  Laterality: Left;   RIGHT/LEFT HEART CATH AND CORONARY ANGIOGRAPHY N/A 12/18/2020   Procedure: RIGHT/LEFT HEART CATH AND CORONARY ANGIOGRAPHY;  Surgeon: Cherrie Toribio SAUNDERS, MD;  Location: [facility] INVASIVE CV LAB;  Service: Cardiovascular;  Laterality: N/A;   RIGHT/LEFT HEART CATH AND CORONARY ANGIOGRAPHY N/A 03/05/2021   Procedure: RIGHT/LEFT HEART CATH AND CORONARY ANGIOGRAPHY;  Surgeon: Rolan Ezra RAMAN, MD;  Location: [facility] INVASIVE CV LAB;  Service: Cardiovascular;  Laterality: N/A;   TEE WITHOUT CARDIOVERSION N/A 12/01/2020   Procedure: TRANSESOPHAGEAL ECHOCARDIOGRAM (TEE);  Surgeon: Rolan Ezra RAMAN, MD;  Location: [facility] ENDOSCOPY;  Service: Cardiovascular;  Laterality: N/AMERL LULLA BOOM ABLATION N/A 03/08/2021   Procedure: LULLA BOOM ABLATION;  Surgeon: Cindie Ole DASEN, MD;  Location: [facility] INVASIVE CV LAB;  Service: Cardiovascular;  Laterality: N/A;    Social  History:  reports that she quit smoking about 2 years ago. Her smoking use included cigarettes and e-cigarettes. She started smoking about 46 years ago. She has a 10.8 pack-year smoking history. She uses smokeless tobacco. She reports that she does not drink alcohol and does not use drugs.   Allergies  Allergen Reactions   Beef-Derived Drug Products Anaphylaxis    From tick bite   Metoclopramide Hcl Anaphylaxis    Non responsive   Penicillins Anaphylaxis    Shock Has patient had a PCN reaction causing immediate rash, facial/tongue/throat swelling, SOB or lightheadedness with hypotension: Yes Has patient had a PCN reaction causing severe rash involving mucus membranes or skin necrosis: No Has patient had a PCN reaction that required hospitalization Yes Has patient had a PCN reaction occurring within the last 10 years: No If all of the above answers are NO, then may proceed with Cephalosporin    Pork-Derived Products Anaphylaxis    From tick bite. Has tolerated heparin /lovenox    Metoprolol Other (See Comments)    Syncope    Statins Other (See Comments)  Myalgias    Family History  Problem Relation Age of Onset   Uterine cancer Mother    Heart disease Father 56   Hyperlipidemia Father    Hypertension Father    Ankylosing spondylitis Sister    Heart disease Sister    Hypertension Sister    Stomach cancer Brother 87   Heart attack Brother    Breast cancer Neg Hx      Prior to Admission medications   Medication Sig Start Date End Date Taking? Authorizing Provider  amiodarone  (PACERONE ) 200 MG tablet TAKE 1 AND 1/2 TABLETS BY MOUTH MONDAY-THURSDAY. TAKE 2 TABLETS BY MOUTH FRIDAY-SUNDAY Patient taking differently: Take 100-200 mg by mouth See admin instructions. Take 200 mg every day at bedtime, Take 200 mg Friday, Saturday and Sunday morning, Then take 100 mg Monday - Thursday in morning. 11/10/23   Miriam Norris, NP  carvedilol  (COREG ) 12.5 MG tablet TAKE 1 TABLET BY MOUTH  TWICE DAILY WITH A MEAL (NEED FOLLOWING UP APPOINTMENT FOR MORE REFILLS) 05/07/24   Rolan Ezra RAMAN, MD  DULoxetine  (CYMBALTA ) 60 MG capsule Take 1 capsule (60 mg total) by mouth daily. **NEEDS TO BE SEEN BEFORE NEXT REFILL** 06/07/24   Lavell Lye A, FNP  empagliflozin  (JARDIANCE ) 10 MG TABS tablet Take 1 tablet (10 mg total) by mouth daily before breakfast. 06/30/23   Debera Jayson MATSU, MD  enoxaparin  (LOVENOX ) 150 MG/ML injection Inject 1 mL (150 mg total) into the skin daily. 05/04/24   Debera Jayson MATSU, MD  EPINEPHRINE  0.3 mg/0.3 mL IJ SOAJ injection USE AS DIRECTED 05/02/22   Merlynn Niki FALCON, FNP  Evolocumab  (REPATHA  SURECLICK) 140 MG/ML SOAJ Inject 140 mg into the skin every 14 (fourteen) days. 04/15/24   Rolan Ezra RAMAN, MD  ezetimibe  (ZETIA ) 10 MG tablet TAKE 1 TABLET BY MOUTH EVERY DAY (patient needs office visit FOR further refills) 05/07/24   Rolan Ezra RAMAN, MD  fluticasone  (FLONASE ) 50 MCG/ACT nasal spray Place 1 spray into both nostrils as needed for allergies or rhinitis.    [provider]  isosorbide  mononitrate (IMDUR ) 30 MG 24 hr tablet Take 1 tablet (30 mg total) by mouth 2 (two) times daily. 02/23/24   Rolan Ezra RAMAN, MD  mexiletine (MEXITIL ) 150 MG capsule TAKE TWO CAPSULES BY MOUTH THREE TIMES DAILY 10/09/23   Waddell Danelle ORN, MD  Multiple Vitamins-Minerals (MULTIVITAMINS THER. W/MINERALS) TABS Take 1 tablet by mouth at bedtime.    [provider]  nitroGLYCERIN  (NITROSTAT ) 0.4 MG SL tablet PLACE 1 TABLET UNDER THE TONGUE EVERY 5 MINUTES FOR 3 DOSES AS NEEDED CHEST PAIN 09/19/23   Rolan Ezra RAMAN, MD  oxyCODONE -acetaminophen  (PERCOCET) 10-325 MG tablet Take 1 tablet by mouth every 4 (four) hours as needed for pain.    [provider]  pantoprazole  (PROTONIX ) 40 MG tablet TAKE 1 TABLET BY MOUTH DAILY 03/10/24   McLean, Dalton S, MD  potassium chloride  SA (KLOR-CON  M) 20 MEQ tablet TAKE 2 TABLETS BY MOUTH EVERY MORNING and TAKE 1 TABLET EVERY EVENING 05/07/24    Rolan Ezra RAMAN, MD  pravastatin  (PRAVACHOL ) 20 MG tablet Take 20 mg by mouth 3 (three) times a week.    [provider]  promethazine  (PHENERGAN ) 25 MG tablet TAKE 1 TABLET BY MOUTH EVERY 8 HOURS AS NEEDED 06/22/24   Lavell Lye A, FNP  sacubitril -valsartan  (ENTRESTO ) 24-26 MG Take 1 tablet by mouth 2 (two) times daily. 07/16/23   Debera Jayson MATSU, MD  sennosides-docusate sodium  (SENOKOT-S) 8.6-50 MG tablet Take 1  tablet by mouth daily as needed.    [provider]  spironolactone  (ALDACTONE ) 25 MG tablet TAKE 1 TABLET BY MOUTH ONCE DAILY 03/08/24   Debera Jayson MATSU, MD  torsemide  (DEMADEX ) 20 MG tablet Take 2 tablets (40 mg total) by mouth 2 (two) times daily. 02/23/24   Rolan Ezra RAMAN, MD  traZODone  (DESYREL ) 100 MG tablet Take 1 tablet (100 mg total) by mouth at bedtime as needed for sleep. 12/09/23   Lavell Lye A, FNP  Vericiguat  (VERQUVO ) 5 MG TABS TAKE 1 TABLET BY MOUTH ONCE DAILY 01/07/24   Debera Jayson MATSU, MD  warfarin (COUMADIN ) 5 MG tablet TAKE ONE-HALF TO 1 TABLET BY MOUTH EVERY DAY OR USE AS DIRECTED by coumadin  clinic Patient taking differently: Take 2.5-5 mg by mouth See admin instructions. Take 5 mg on Mon., Wed., and Friday, all the other days take 2.5 mg in the evening  USE AS DIRECTED by coumadin  clinic 03/08/24   Debera Jayson MATSU, MD  WEGOVY  0.25 MG/0.5ML SOAJ INJECT 0.25 MG UNDER THE SKIN ONCE A WEEK 06/16/24   Rolan Ezra RAMAN, MD    Physical Exam: BP (!) 85/62   Pulse 74   Temp (!) 96.8 F (36 C) (Axillary)   Resp 15   Ht 5' 5 (1.651 m)   Wt 88.5 kg   SpO2 95%   BMI 32.45 kg/m   General: 63 y.o. year-old female well developed well nourished in no acute distress.  Alert and oriented x3. HEENT: NCAT, EOMI Neck: Supple, trachea medial Cardiovascular: Regular rate and rhythm with no rubs or gallops.  No thyromegaly or JVD noted.  No lower extremity edema. 2/4 pulses in all 4 extremities. Respiratory: Clear to auscultation with no  wheezes or rales. Good inspiratory effort. Abdomen: Soft, tender to palpation of left side of abdomen.  Nondistended with normal bowel sounds x4 quadrants. Muskuloskeletal: Left hand in a cast due to prior fracture.  No cyanosis, clubbing or edema noted bilaterally Neuro: CN II-XII intact, strength 5/5 x 4, sensation, reflexes intact Skin: Left-sided abdominal wall hematoma.  No ulcerative lesions noted  Psychiatry: Judgement and insight appear normal. Mood is appropriate for condition and setting          Labs on Admission:  Basic Metabolic Panel: Recent Labs  Lab 06/25/24 2239  NA 134*  K 4.4  CL 95*  CO2 23  GLUCOSE 126*  BUN 18  CREATININE 2.05*  CALCIUM  8.8*   Liver Function Tests: Recent Labs  Lab 06/25/24 2239  AST 43*  ALT 47*  ALKPHOS 92  BILITOT 0.9  PROT 7.0  ALBUMIN  3.3*   No results for input(s): LIPASE, AMYLASE in the last 168 hours. No results for input(s): AMMONIA in the last 168 hours. CBC: Recent Labs  Lab 06/25/24 2239 06/26/24 0147  WBC 11.9*  --   NEUTROABS 9.6*  --   HGB 14.3 13.5  HCT 42.5 40.9  MCV 94.7  --   PLT 187  --    Cardiac Enzymes: No results for input(s): CKTOTAL, CKMB, CKMBINDEX, TROPONINI in the last 168 hours.  BNP (last 3 results) No results for input(s): BNP in the last 8760 hours.  ProBNP (last 3 results) No results for input(s): PROBNP in the last 8760 hours.  CBG: No results for input(s): GLUCAP in the last 168 hours.  Radiological Exams on Admission: CT CHEST ABDOMEN PELVIS W CONTRAST Result Date: 06/26/2024 CLINICAL DATA:  Clemens at home. Blunt polytrauma. Patient is on warfarin. Pain in  lower left stomach. Hematoma and bruising to left chest and under left arm. EXAM: CT CHEST, ABDOMEN, AND PELVIS WITH CONTRAST TECHNIQUE: Multidetector CT imaging of the chest, abdomen and pelvis was performed following the standard protocol during bolus administration of intravenous contrast. RADIATION DOSE  REDUCTION: This exam was performed according to the departmental dose-optimization program which includes automated exposure control, adjustment of the mA and/or kV according to patient size and/or use of iterative reconstruction technique. CONTRAST:  80mL OMNIPAQUE  IOHEXOL  300 MG/ML  SOLN COMPARISON:  Chest radiograph 06/25/2024; CT pelvis 05/03/2024; CTA chest 08/11/2020 FINDINGS: CT CHEST FINDINGS Cardiovascular: Dilated left ventricle. No pericardial effusion. Coronary artery and aortic atherosclerotic calcification. Left chest wall pacemaker. No evidence of acute aortic injury. Mediastinum/Nodes: Fluid and debris in the esophagus nearly reaching the thoracic inlet. Trachea is unremarkable. No thoracic adenopathy. Lungs/Pleura: No focal consolidation, pleural effusion, or pneumothorax. 3 mm nodule in the left lower lobe on series 2/image 26 is stable since 2020 and benign. No follow-up recommended. Musculoskeletal: No acute fracture. CT ABDOMEN PELVIS FINDINGS Hepatobiliary: Cholelithiasis. Distended gallbladder. No wall thickening or pericholecystic fluid. No biliary dilation. Unremarkable liver. Pancreas: Fatty atrophy.  No acute abnormality. Spleen: Unremarkable. Adrenals/Urinary Tract: Normal adrenal glands. No evidence of renal injury. No hydronephrosis or urinary calculi. Unremarkable bladder. Stomach/Bowel: Normal caliber large and small bowel. No bowel wall thickening. Large stool burden in the right colon and cecum. The appendix is not visualized. Vascular/Lymphatic: Aortic atherosclerosis. No enlarged abdominal or pelvic lymph nodes. Reproductive: No acute abnormality. Other: No free intraperitoneal fluid or air. Soft tissue contusion and large hematoma in the anterolateral left abdominal wall. There is a fluid hematocrit level. The hematoma measures 9.4 x 6.7 by 17.5 cm. There are a few areas of hyperdensity which are indeterminate on single phase exam but could represent active bleeding (series  2/image 93-89). 2.7 x 2.3 cm fluid collection in the lateral left thigh with mild peripheral enhancement (series 2/image 129). This is favored to represent organization of the previously seen hematoma on 05/03/2024. Superimposed infection is considered unlikely but not excluded via imaging. Musculoskeletal: No acute fracture.  Lateral fusion T12-L2. IMPRESSION: 1. Soft tissue contusion and large hematoma in the anterolateral left abdominal wall measuring 9.4 x 6.7 x 17.5 cm. There are a few areas of hyperdensity which are indeterminate on single phase exam but suspicious for active bleeding. 2. 2.7 x 2.3 cm fluid collection in the lateral left thigh with mild peripheral enhancement. This is favored to represent organization of the previously seen hematoma on 05/03/2024. Superimposed infection is considered unlikely but not excluded via imaging. 3. No evidence of acute traumatic injury within the chest. 4. Fluid and debris in the esophagus nearly reaching the thoracic inlet consistent with reflux. 5. Cholelithiasis. 6. Aortic Atherosclerosis (ICD10-I70.0). Electronically Signed   By: Norman Gatlin M.D.   On: 06/26/2024 00:37   DG Chest Portable 1 View Result Date: 06/25/2024 CLINICAL DATA:  chest injury EXAM: PORTABLE CHEST - 1 VIEW COMPARISON:  04/17/2023. FINDINGS: Cardiac silhouette is prominent. No pneumothorax or pleural effusion. Linear subsegmental atelectasis or scarring in the left mid lung. Lungs otherwise clear. Aorta is calcified. The visualized skeletal structures are unremarkable. There is a left-sided pacer. IMPRESSION: Prominent cardiac silhouette. Linear subsegmental atelectasis or scarring in the left mid lung. Electronically Signed   By: Fonda Field M.D.   On: 06/25/2024 23:26    EKG: I independently viewed the EKG done and my findings are as followed: Normal sinus rhythm at a  rate of 90 bpm  Assessment/Plan Present on Admission:  Abdominal wall hematoma  GERD (gastroesophageal  reflux disease)  Chronic systolic CHF (congestive heart failure) (HCC)  Mixed hyperlipidemia  Principal Problem:   Abdominal wall hematoma Active Problems:   Mixed hyperlipidemia   GERD (gastroesophageal reflux disease)   Chronic systolic CHF (congestive heart failure) (HCC)   Supratherapeutic INR   Fall at home, initial encounter   Hypotension   Hypoalbuminemia due to protein-calorie malnutrition (HCC)  Abdominal wall hematoma Abdominal binder was provided Continue to monitor patient  Supratherapeutic INR Antiphospholipid syndrome INR 6.3; 1 mg vitamin K IV was given Continue to monitor INR Warfarin will be held at this time  Fall at home Continue fall precaution Continue PT/OT eval and treat  Hypotension Patient endorsed soft BP at baseline IV fluid bolus was given but patient's BP continues to be within hypotensive range Peripheral IV Levophed will be started at this time with plan to wean patient off this as tolerated Patient may require midodrine after being weaned off Levophed  Hypoalbuminemia possibly secondary to mild protein calorie malnutrition Albumin  3.3, protein supplement will be provided  GERD Continue Protonix   Chronic systolic CHF Coreg , Entresto , spironolactone , will be held at this time due to soft BP  Mixed hyperlipidemia Continue Zetia  Statin will be temporarily held due to mild transaminitis She takes Repatha  every 14 days  DVT prophylaxis: SCDs  Code Status: Full code  Family Communication: None at bedside  Consults: None   Severity of Illness: The appropriate patient status for this patient is INPATIENT. Inpatient status is judged to be reasonable and necessary in order to provide the required intensity of service to ensure the patient's safety. The patient's presenting symptoms, physical exam findings, and initial radiographic and laboratory data in the context of their chronic comorbidities is felt to place them at high risk for  further clinical deterioration. Furthermore, it is not anticipated that the patient will be medically stable for discharge from the hospital within 2 midnights of admission.   * I certify that at the point of admission it is my clinical judgment that the patient will require inpatient hospital care spanning beyond 2 midnights from the point of admission due to high intensity of service, high risk for further deterioration and high frequency of surveillance required.*  Author: Dazani Norby, DO 06/26/2024 5:46 AM  For on call review [email or web address].

## 2024-06-27 ENCOUNTER — Inpatient Hospital Stay (HOSPITAL_COMMUNITY)

## 2024-06-27 DIAGNOSIS — S301XXA Contusion of abdominal wall, initial encounter: Secondary | ICD-10-CM

## 2024-06-27 DIAGNOSIS — I5022 Chronic systolic (congestive) heart failure: Secondary | ICD-10-CM

## 2024-06-27 DIAGNOSIS — R791 Abnormal coagulation profile: Secondary | ICD-10-CM | POA: Diagnosis not present

## 2024-06-27 DIAGNOSIS — R0609 Other forms of dyspnea: Secondary | ICD-10-CM | POA: Diagnosis not present

## 2024-06-27 DIAGNOSIS — Z95810 Presence of automatic (implantable) cardiac defibrillator: Secondary | ICD-10-CM

## 2024-06-27 DIAGNOSIS — E861 Hypovolemia: Secondary | ICD-10-CM

## 2024-06-27 DIAGNOSIS — I4819 Other persistent atrial fibrillation: Secondary | ICD-10-CM

## 2024-06-27 DIAGNOSIS — Z7901 Long term (current) use of anticoagulants: Secondary | ICD-10-CM

## 2024-06-27 LAB — PROTIME-INR
INR: 1.4 — ABNORMAL HIGH (ref 0.8–1.2)
Prothrombin Time: 17.8 s — ABNORMAL HIGH (ref 11.4–15.2)

## 2024-06-27 LAB — RENAL FUNCTION PANEL
Albumin: 3 g/dL — ABNORMAL LOW (ref 3.5–5.0)
Anion gap: 12 (ref 5–15)
BUN: 18 mg/dL (ref 8–23)
CO2: 23 mmol/L (ref 22–32)
Calcium: 8.2 mg/dL — ABNORMAL LOW (ref 8.9–10.3)
Chloride: 92 mmol/L — ABNORMAL LOW (ref 98–111)
Creatinine, Ser: 1.64 mg/dL — ABNORMAL HIGH (ref 0.44–1.00)
GFR, Estimated: 35 mL/min — ABNORMAL LOW (ref >60)
Glucose, Bld: 113 mg/dL — ABNORMAL HIGH (ref 70–99)
Phosphorus: 4.1 mg/dL (ref 2.5–4.6)
Potassium: 3.9 mmol/L (ref 3.5–5.1)
Sodium: 127 mmol/L — ABNORMAL LOW (ref 135–145)

## 2024-06-27 LAB — ECHOCARDIOGRAM COMPLETE
AR max vel: 2.36 cm2
AV Area VTI: 2.16 cm2
AV Area mean vel: 2.21 cm2
AV Mean grad: 4 mmHg
AV Peak grad: 10.2 mmHg
Ao pk vel: 1.6 m/s
Area-P 1/2: 2.36 cm2
Calc EF: 35.6 %
Height: 65 in
S' Lateral: 5.6 cm
Single Plane A2C EF: 36.5 %
Single Plane A4C EF: 29.4 %
Weight: 3259.28 [oz_av]

## 2024-06-27 LAB — CBC
HCT: 30.4 % — ABNORMAL LOW (ref 36.0–46.0)
Hemoglobin: 9.8 g/dL — ABNORMAL LOW (ref 12.0–15.0)
MCH: 30.9 pg (ref 26.0–34.0)
MCHC: 32.2 g/dL (ref 30.0–36.0)
MCV: 95.9 fL (ref 80.0–100.0)
Platelets: 136 10*3/uL — ABNORMAL LOW (ref 150–400)
RBC: 3.17 MIL/uL — ABNORMAL LOW (ref 3.87–5.11)
RDW: 12.9 % (ref 11.5–15.5)
WBC: 12.8 10*3/uL — ABNORMAL HIGH (ref 4.0–10.5)
nRBC: 0 % (ref 0.0–0.2)

## 2024-06-27 LAB — HEMOGLOBIN AND HEMATOCRIT, BLOOD
HCT: 30.7 % — ABNORMAL LOW (ref 36.0–46.0)
Hemoglobin: 10.2 g/dL — ABNORMAL LOW (ref 12.0–15.0)

## 2024-06-27 MED ORDER — PROCHLORPERAZINE EDISYLATE 10 MG/2ML IJ SOLN
10.0000 mg | Freq: Four times a day (QID) | INTRAMUSCULAR | Status: DC | PRN
  Administered 2024-06-27 – 2024-07-09 (×14): 10 mg via INTRAVENOUS
  Filled 2024-06-27 (×16): qty 2

## 2024-06-27 MED ORDER — DIPHENHYDRAMINE HCL 25 MG PO CAPS
25.0000 mg | ORAL_CAPSULE | Freq: Once | ORAL | Status: AC
  Administered 2024-06-27: 25 mg via ORAL
  Filled 2024-06-27: qty 1

## 2024-06-27 MED ORDER — PERFLUTREN LIPID MICROSPHERE
1.0000 mL | INTRAVENOUS | Status: AC | PRN
  Administered 2024-06-27: 2 mL via INTRAVENOUS

## 2024-06-27 MED ORDER — MIDODRINE HCL 5 MG PO TABS
10.0000 mg | ORAL_TABLET | Freq: Three times a day (TID) | ORAL | Status: DC
  Administered 2024-06-27 – 2024-07-02 (×15): 10 mg via ORAL
  Filled 2024-06-27 (×15): qty 2

## 2024-06-27 MED ORDER — SODIUM CHLORIDE 0.9 % IV SOLN: INTRAVENOUS | Status: DC

## 2024-06-27 MED ORDER — SODIUM CHLORIDE 0.9 % IV SOLN: INTRAVENOUS | Status: AC

## 2024-06-27 NOTE — Plan of Care (Signed)

## 2024-06-27 NOTE — Progress Notes (Addendum)
 PROGRESS NOTE   [name] [name], is a 63 y.o. female, DOB - [birth year], FMW:[id number]  Admit date - 06/25/2024   Admitting Physician Posey Maier, DO  Outpatient Primary MD for the patient is Lavell Bari LABOR, FNP  LOS - 1  Chief Complaint  Patient presents with   Fall      Brief Summary:- 63 y.o. female with medical history significant of antiphospholipid syndrome on warfarin, CHF, A-fib admitted on 06/26/2024 with abdominal wall hematoma in setting of supratherapeutic INR and follow home   A/p 1)Persistent Hypotension--suspect this is hypovolemic due to acute blood loss in the setting of abdominal wall hematoma due to follow home patient with supratherapeutic INR --Currently requiring IV Levophed for pressure support -- Hold PTA Coreg , Aldactone  and isosorbide  due to hypotension   2)Abdominal wall Hematoma--- noticed post fall at home in the setting of supratherapeutic INR Abdominal binder was provided Continue to monitor patient   3) acute blood loss anemia due to #2 above-- Hgb 14.3 >>13.5 >>10.7 >>11.5>>9.8 - Monitor H&H and transfuse as clinically indicated --Hemoglobin drifting down   4)Supratherapeutic INR--in the setting of Coumadin  therapy for Antiphospholipid syndrome INR 6.3 >>3.4>> 1.4  -INR normalized after vitamin K x 2 doses --reports poor oral intake over the last few weeks due to Wegovy  therapy--poor oral  intake probably exacerbated her supratherapeutic INR associated with lack of vitamin K intake   5)Fall at home Continue fall precaution Continue PT/OT eval and treat   6)GERD Continue Protonix    7)Chronic systolic CHF Coreg , Entresto , isosorbide  spironolactone , will be held at this time due to soft BP   8)Mixed hyperlipidemia Continue Zetia  Statin will be temporarily held due to mild transaminitis She takes Repatha  every 14 days  9)HypoNatremia--- multifactorial - Avoid excessive free water -Given normal saline IV - Consider sodium chloride   tablets   CRITICAL CARE Performed by: Rendall Carwin   Total critical care time: 41 minutes   Critical care time was exclusive of separately billable procedures and treating other patients. - Persistent hypotension despite IV fluids due to acute blood loss anemia requiring IV Levophed for pressure support -- Start p.o. midodrine and try to wean off IV Levophed   Critical care was necessary to treat or prevent imminent or life-threatening deterioration.   Critical care was time spent personally by me on the following activities: development of treatment plan with patient and/or surrogate as well as nursing, discussions with consultants, evaluation of patient's response to treatment, examination of patient, obtaining history from patient or surrogate, ordering and performing treatments and interventions, ordering and review of laboratory studies, ordering and review of radiographic studies, pulse oximetry and re-evaluation of patient's condition.  Status is: Inpatient   Disposition: The patient is from: Home              Anticipated d/c is to: Home              Anticipated d/c date is: 2 days              Patient currently is not medically stable to d/c. Barriers: Not Clinically Stable-   Code Status :  -  Code Status: Prior   Family Communication:    NA (patient is alert, awake and coherent)   DVT Prophylaxis  :   - SCDs   SCDs Start: 06/26/24 1016 Place TED hose Start: 06/26/24 1016   Lab Results  Component Value Date   PLT 136 (L) 06/27/2024   Inpatient Medications  Scheduled Meds:  [  START ON 06/28/2024] amiodarone   100 mg Oral Once per day on Monday Tuesday Wednesday Thursday   [START ON 06/28/2024] amiodarone   200 mg Oral Once per day on Monday Tuesday Wednesday Thursday   amiodarone   200 mg Oral 2 times per day on Sunday Friday Saturday   Chlorhexidine  Gluconate Cloth  6 each Topical Q0600   DULoxetine   60 mg Oral Daily   ezetimibe   10 mg Oral Daily   feeding  supplement  237 mL Oral BID BM   mexiletine  300 mg Oral TID   midodrine  10 mg Oral TID WC   pantoprazole   40 mg Oral Daily   Vericiguat   1 tablet Oral Daily   Continuous Infusions:  norepinephrine (LEVOPHED) Adult infusion 4 mcg/min (06/27/24 1107)   PRN Meds:.ondansetron  (ZOFRAN ) IV, oxyCODONE -acetaminophen  **AND** oxyCODONE    Anti-infectives (From admission, onward)    None      Subjective: [name] [name] today has no fevers, no emesis,  No chest pain,    - No BM - Voiding well - No significant dyspnea  Objective: Vitals:   06/27/24 1115 06/27/24 1130 06/27/24 1133 06/27/24 1330  BP: (!) 126/35 (!) 129/50  (!) 118/96  Pulse: 82 93  67  Resp:    16  Temp:   98.3 F (36.8 C)   TempSrc:   Oral   SpO2: 92% 96%  97%  Weight:      Height:        Intake/Output Summary (Last 24 hours) at 06/27/2024 1357 Last data filed at 06/27/2024 0912 Gross per 24 hour  Intake 946.61 ml  Output 2250 ml  Net -1303.39 ml   Filed Weights   06/25/24 2152 06/27/24 0423  Weight: 88.5 kg 92.4 kg    Physical Exam  Gen:- Awake Alert,  in no apparent distress  HEENT:- [place].AT, No sclera icterus Neck-Supple Neck,No JVD,.  Lungs-  CTAB , fair symmetrical air movement CV- S1, S2 normal, regular , AICD in situ Abd-  +ve B.Sounds, Abd Soft, No tenderness, left lower quadrant anterior abdominal wall area and left upper thigh area with ecchymosis    Extremity/Skin:- No  edema, pedal pulses present  Psych-affect is appropriate, oriented x3 Neuro-no new focal deficits, no tremors MSK-Lt UE in short forearm cast  Data Reviewed: I have personally reviewed following labs and imaging studies  CBC: Recent Labs  Lab 06/25/24 2239 06/26/24 0147 06/26/24 0937 06/26/24 1617 06/27/24 0404  WBC 11.9*  --   --   --  12.8*  NEUTROABS 9.6*  --   --   --   --   HGB 14.3 13.5 10.7* 11.5* 9.8*  HCT 42.5 40.9 33.5* 34.9* 30.4*  MCV 94.7  --   --   --  95.9  PLT 187  --   --   --  136*   Basic  Metabolic Panel: Recent Labs  Lab 06/25/24 2239 06/27/24 0404  NA 134* 127*  K 4.4 3.9  CL 95* 92*  CO2 23 23  GLUCOSE 126* 113*  BUN 18 18  CREATININE 2.05* 1.64*  CALCIUM  8.8* 8.2*  PHOS  --  4.1   GFR: Estimated Creatinine Clearance: 40.5 mL/min (A) (by C-G formula based on SCr of 1.64 mg/dL (H)). Liver Function Tests: Recent Labs  Lab 06/25/24 2239 06/27/24 0404  AST 43*  --   ALT 47*  --   ALKPHOS 92  --   BILITOT 0.9  --   PROT 7.0  --   ALBUMIN  3.3*  3.0*   Recent Results (from the past 240 hours)  MRSA Next Gen by PCR, Nasal     Status: None   Collection Time: 06/26/24  2:46 AM   Specimen: Nasal Mucosa; Nasal Swab  Result Value Ref Range Status   MRSA by PCR Next Gen NOT DETECTED NOT DETECTED Final    Comment: (NOTE) The GeneXpert MRSA Assay (FDA approved for NASAL specimens only), is one component of a comprehensive MRSA colonization surveillance program. It is not intended to diagnose MRSA infection nor to guide or monitor treatment for MRSA infections. Test performance is not FDA approved in patients less than 66 years old. Performed at [facility], [address] [place], [place] [address]     Radiology Studies: CT CHEST ABDOMEN PELVIS W CONTRAST Result Date: 06/26/2024 CLINICAL DATA:  Clemens at home. Blunt polytrauma. Patient is on warfarin. Pain in lower left stomach. Hematoma and bruising to left chest and under left arm. EXAM: CT CHEST, ABDOMEN, AND PELVIS WITH CONTRAST TECHNIQUE: Multidetector CT imaging of the chest, abdomen and pelvis was performed following the standard protocol during bolus administration of intravenous contrast. RADIATION DOSE REDUCTION: This exam was performed according to the departmental dose-optimization program which includes automated exposure control, adjustment of the mA and/or kV according to patient size and/or use of iterative reconstruction technique. CONTRAST:  80mL OMNIPAQUE  IOHEXOL  300 MG/ML  SOLN COMPARISON:  Chest  radiograph 06/25/2024; CT pelvis 05/03/2024; CTA chest 08/11/2020 FINDINGS: CT CHEST FINDINGS Cardiovascular: Dilated left ventricle. No pericardial effusion. Coronary artery and aortic atherosclerotic calcification. Left chest wall pacemaker. No evidence of acute aortic injury. Mediastinum/Nodes: Fluid and debris in the esophagus nearly reaching the thoracic inlet. Trachea is unremarkable. No thoracic adenopathy. Lungs/Pleura: No focal consolidation, pleural effusion, or pneumothorax. 3 mm nodule in the left lower lobe on series 2/image 26 is stable since 2020 and benign. No follow-up recommended. Musculoskeletal: No acute fracture. CT ABDOMEN PELVIS FINDINGS Hepatobiliary: Cholelithiasis. Distended gallbladder. No wall thickening or pericholecystic fluid. No biliary dilation. Unremarkable liver. Pancreas: Fatty atrophy.  No acute abnormality. Spleen: Unremarkable. Adrenals/Urinary Tract: Normal adrenal glands. No evidence of renal injury. No hydronephrosis or urinary calculi. Unremarkable bladder. Stomach/Bowel: Normal caliber large and small bowel. No bowel wall thickening. Large stool burden in the right colon and cecum. The appendix is not visualized. Vascular/Lymphatic: Aortic atherosclerosis. No enlarged abdominal or pelvic lymph nodes. Reproductive: No acute abnormality. Other: No free intraperitoneal fluid or air. Soft tissue contusion and large hematoma in the anterolateral left abdominal wall. There is a fluid hematocrit level. The hematoma measures 9.4 x 6.7 by 17.5 cm. There are a few areas of hyperdensity which are indeterminate on single phase exam but could represent active bleeding (series 2/image 93-89). 2.7 x 2.3 cm fluid collection in the lateral left thigh with mild peripheral enhancement (series 2/image 129). This is favored to represent organization of the previously seen hematoma on 05/03/2024. Superimposed infection is considered unlikely but not excluded via imaging. Musculoskeletal: No  acute fracture.  Lateral fusion T12-L2. IMPRESSION: 1. Soft tissue contusion and large hematoma in the anterolateral left abdominal wall measuring 9.4 x 6.7 x 17.5 cm. There are a few areas of hyperdensity which are indeterminate on single phase exam but suspicious for active bleeding. 2. 2.7 x 2.3 cm fluid collection in the lateral left thigh with mild peripheral enhancement. This is favored to represent organization of the previously seen hematoma on 05/03/2024. Superimposed infection is considered unlikely but not excluded via imaging. 3. No evidence of  acute traumatic injury within the chest. 4. Fluid and debris in the esophagus nearly reaching the thoracic inlet consistent with reflux. 5. Cholelithiasis. 6. Aortic Atherosclerosis (ICD10-I70.0). Electronically Signed   By: Norman Gatlin M.D.   On: 06/26/2024 00:37   DG Chest Portable 1 View Result Date: 06/25/2024 CLINICAL DATA:  chest injury EXAM: PORTABLE CHEST - 1 VIEW COMPARISON:  04/17/2023. FINDINGS: Cardiac silhouette is prominent. No pneumothorax or pleural effusion. Linear subsegmental atelectasis or scarring in the left mid lung. Lungs otherwise clear. Aorta is calcified. The visualized skeletal structures are unremarkable. There is a left-sided pacer. IMPRESSION: Prominent cardiac silhouette. Linear subsegmental atelectasis or scarring in the left mid lung. Electronically Signed   By: Fonda Field M.D.   On: 06/25/2024 23:26   Scheduled Meds:  [START ON 06/28/2024] amiodarone   100 mg Oral Once per day on Monday Tuesday Wednesday Thursday   [START ON 06/28/2024] amiodarone   200 mg Oral Once per day on Monday Tuesday Wednesday Thursday   amiodarone   200 mg Oral 2 times per day on Sunday Friday Saturday   Chlorhexidine  Gluconate Cloth  6 each Topical Q0600   DULoxetine   60 mg Oral Daily   ezetimibe   10 mg Oral Daily   feeding supplement  237 mL Oral BID BM   mexiletine  300 mg Oral TID   midodrine  10 mg Oral TID WC   pantoprazole   40  mg Oral Daily   Vericiguat   1 tablet Oral Daily   Continuous Infusions:  norepinephrine (LEVOPHED) Adult infusion 4 mcg/min (06/27/24 1107)    LOS: 1 day   Rendall Carwin M.D on 06/27/2024 at 1:57 PM  Go to [email or web address] - for contact info  [facility] - Office  [phone]  If 7PM-7AM, please contact night-coverage [email or web address] 06/27/2024, 1:57 PM

## 2024-06-27 NOTE — Plan of Care (Signed)

## 2024-06-27 NOTE — Progress Notes (Signed)
*  PRELIMINARY RESULTS* Echocardiogram 2D Echocardiogram has been performed.  [name] [name] 06/27/2024, 3:56 PM

## 2024-06-28 ENCOUNTER — Other Ambulatory Visit: Payer: Self-pay

## 2024-06-28 ENCOUNTER — Inpatient Hospital Stay (HOSPITAL_COMMUNITY)

## 2024-06-28 DIAGNOSIS — D689 Coagulation defect, unspecified: Secondary | ICD-10-CM | POA: Diagnosis not present

## 2024-06-28 DIAGNOSIS — E861 Hypovolemia: Secondary | ICD-10-CM | POA: Diagnosis not present

## 2024-06-28 DIAGNOSIS — N179 Acute kidney failure, unspecified: Secondary | ICD-10-CM | POA: Diagnosis not present

## 2024-06-28 DIAGNOSIS — I959 Hypotension, unspecified: Secondary | ICD-10-CM | POA: Diagnosis not present

## 2024-06-28 DIAGNOSIS — Z95810 Presence of automatic (implantable) cardiac defibrillator: Secondary | ICD-10-CM | POA: Diagnosis not present

## 2024-06-28 DIAGNOSIS — W19XXXA Unspecified fall, initial encounter: Secondary | ICD-10-CM | POA: Diagnosis not present

## 2024-06-28 DIAGNOSIS — S301XXA Contusion of abdominal wall, initial encounter: Secondary | ICD-10-CM | POA: Diagnosis not present

## 2024-06-28 LAB — PROTIME-INR
INR: 1.3 — ABNORMAL HIGH (ref 0.8–1.2)
Prothrombin Time: 16.8 s — ABNORMAL HIGH (ref 11.4–15.2)

## 2024-06-28 LAB — URINALYSIS, ROUTINE W REFLEX MICROSCOPIC
Bilirubin Urine: NEGATIVE
Glucose, UA: >=500 mg/dL — AB
Hgb urine dipstick: NEGATIVE
Ketones, ur: NEGATIVE mg/dL
Leukocytes,Ua: NEGATIVE
Nitrite: NEGATIVE
Protein, ur: 30 mg/dL — AB
Specific Gravity, Urine: 1.012 (ref 1.005–1.030)
pH: 5 (ref 5.0–8.0)

## 2024-06-28 LAB — BASIC METABOLIC PANEL WITH GFR
Anion gap: 9 (ref 5–15)
BUN: 13 mg/dL (ref 8–23)
CO2: 24 mmol/L (ref 22–32)
Calcium: 8.2 mg/dL — ABNORMAL LOW (ref 8.9–10.3)
Chloride: 100 mmol/L (ref 98–111)
Creatinine, Ser: 1.25 mg/dL — ABNORMAL HIGH (ref 0.44–1.00)
GFR, Estimated: 49 mL/min — ABNORMAL LOW (ref >60)
Glucose, Bld: 108 mg/dL — ABNORMAL HIGH (ref 70–99)
Potassium: 4.4 mmol/L (ref 3.5–5.1)
Sodium: 133 mmol/L — ABNORMAL LOW (ref 135–145)

## 2024-06-28 LAB — CBC
HCT: 28.3 % — ABNORMAL LOW (ref 36.0–46.0)
Hemoglobin: 9.4 g/dL — ABNORMAL LOW (ref 12.0–15.0)
MCH: 31.9 pg (ref 26.0–34.0)
MCHC: 33.2 g/dL (ref 30.0–36.0)
MCV: 95.9 fL (ref 80.0–100.0)
Platelets: 134 10*3/uL — ABNORMAL LOW (ref 150–400)
RBC: 2.95 MIL/uL — ABNORMAL LOW (ref 3.87–5.11)
RDW: 13 % (ref 11.5–15.5)
WBC: 14.7 10*3/uL — ABNORMAL HIGH (ref 4.0–10.5)
nRBC: 0 % (ref 0.0–0.2)

## 2024-06-28 LAB — HEMOGLOBIN AND HEMATOCRIT, BLOOD
HCT: 26.1 % — ABNORMAL LOW (ref 36.0–46.0)
Hemoglobin: 8.8 g/dL — ABNORMAL LOW (ref 12.0–15.0)

## 2024-06-28 LAB — PROCALCITONIN: Procalcitonin: 0.16 ng/mL

## 2024-06-28 LAB — PREPARE RBC (CROSSMATCH)

## 2024-06-28 LAB — GLUCOSE, CAPILLARY: Glucose-Capillary: 111 mg/dL — ABNORMAL HIGH (ref 70–99)

## 2024-06-28 MED ORDER — METRONIDAZOLE 500 MG/100ML IV SOLN
500.0000 mg | Freq: Two times a day (BID) | INTRAVENOUS | Status: AC
  Administered 2024-06-28 – 2024-07-02 (×9): 500 mg via INTRAVENOUS
  Filled 2024-06-28 (×9): qty 100

## 2024-06-28 MED ORDER — POLYETHYLENE GLYCOL 3350 17 G PO PACK
17.0000 g | PACK | Freq: Two times a day (BID) | ORAL | Status: DC
  Administered 2024-06-28 – 2024-07-09 (×12): 17 g via ORAL
  Filled 2024-06-28 (×21): qty 1

## 2024-06-28 MED ORDER — SODIUM CHLORIDE 0.45 % IV SOLN
INTRAVENOUS | Status: AC
  Filled 2024-06-28: qty 75

## 2024-06-28 MED ORDER — SODIUM CHLORIDE 0.9 % IV SOLN
2.0000 g | INTRAVENOUS | Status: AC
  Administered 2024-06-28 – 2024-07-02 (×5): 2 g via INTRAVENOUS
  Filled 2024-06-28 (×5): qty 20

## 2024-06-28 MED ORDER — IOHEXOL 300 MG/ML  SOLN
100.0000 mL | Freq: Once | INTRAMUSCULAR | Status: AC | PRN
Start: 2024-06-28 — End: 2024-06-28
  Administered 2024-06-28: 100 mL via INTRAVENOUS

## 2024-06-28 MED ORDER — SODIUM CHLORIDE 0.9% IV SOLUTION: Freq: Once | INTRAVENOUS | Status: AC

## 2024-06-28 MED ORDER — IOHEXOL 9 MG/ML PO SOLN
ORAL | Status: AC
  Filled 2024-06-28: qty 1000

## 2024-06-28 MED ORDER — NOREPINEPHRINE 4 MG/250ML-% IV SOLN
0.0000 ug/min | INTRAVENOUS | Status: DC
  Administered 2024-06-29: 17 ug/min via INTRAVENOUS
  Administered 2024-06-29: 12 ug/min via INTRAVENOUS
  Administered 2024-06-29 (×2): 17 ug/min via INTRAVENOUS
  Administered 2024-06-29: 16 ug/min via INTRAVENOUS
  Administered 2024-06-30 (×2): 15 ug/min via INTRAVENOUS
  Administered 2024-06-30: 12 ug/min via INTRAVENOUS
  Administered 2024-06-30: 15 ug/min via INTRAVENOUS
  Administered 2024-07-01: 8 ug/min via INTRAVENOUS
  Administered 2024-07-01: 7 ug/min via INTRAVENOUS
  Administered 2024-07-02: 9 ug/min via INTRAVENOUS
  Administered 2024-07-02: 8 ug/min via INTRAVENOUS
  Administered 2024-07-02: 15 ug/min via INTRAVENOUS
  Administered 2024-07-03: 12 ug/min via INTRAVENOUS
  Administered 2024-07-03: 10 ug/min via INTRAVENOUS
  Administered 2024-07-03: 15 ug/min via INTRAVENOUS
  Filled 2024-06-28 (×20): qty 250

## 2024-06-28 NOTE — Plan of Care (Signed)

## 2024-06-28 NOTE — H&P (View-Only) (Signed)
 [facility] Consult  Reason for Consult: Left abdominal wall hematoma, ? Hypotension and on levophed  Referring Physician:  Dr. Pearlean   Chief Complaint   Fall     HPI: [name] [name] is a 63 y.o. female with a recent fall and large left lower abdominal wall hematoma noted on imaging 06/25/24. She had some concern for active bleeding then but was admitted to [facility]. The H&P documents that Dr. Ann (Trauma Surgeon on Call [facility]) reported she can stay at [facility] for management given no surgical intervention indicated. There was no documentation about any question of IR involvement. She had an elevated INR at 6.3 and has been reversed since that time. She has had a drifting H&H since her admission. I was consulted today for her continuing to require levophed in the setting of this hematoma.   Given the size and the prior CT, I asked Dr. Pearlean to repeat imaging to ensure that this is not infected or if it is continuing to bleed, which I think is less likely.    Patient reports some pain in the area. She has CHF and EF 30-35%.   Past Medical History:  Diagnosis Date   AICD (automatic cardioverter/defibrillator) present    Aneurysm of internal carotid artery    Anti-phospholipid syndrome (HCC)    Anticardiolipin antibody positive    Chronic Coumadin    Brain aneurysm    followed by Dr. Dolphus   Chronic combined systolic (congestive) and diastolic (congestive) heart failure (HCC)    Cocaine abuse in remission [facility])    Coronary atherosclerosis of native coronary artery    Previous invasive cardiac testing was done at a facility in [place], [place].  Occluded diagonal, Diffuse LAD disease, Occluded Cx, and non obstructive RCA.    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 Malcolm L1 cage, 5 disc fusion   CARDIAC DEFIBRILLATOR PLACEMENT  2008   [employer] ICD   CARDIOVERSION N/A 12/01/2020   Procedure: CARDIOVERSION;  Surgeon: Rolan Ezra RAMAN, MD;  Location: [facility] ENDOSCOPY;  Service: Cardiovascular;  Laterality: N/A;   CORONARY STENT INTERVENTION N/A 01/08/2021   Procedure: CORONARY STENT INTERVENTION;  Surgeon: Swaziland, Peter M, MD;  Location: [facility] INVASIVE CV LAB;  Service: Cardiovascular;  Laterality: N/A;   EP IMPLANTABLE DEVICE N/A 03/01/2016   Procedure: ICD Generator Changeout;  Surgeon: Danelle LELON Birmingham, MD;  Location: [facility] INVASIVE CV LAB;  Service: Cardiovascular;  Laterality: N/A;   ICD/BIV ICD FUNCTION(DFT) TEST N/A 03/14/2021   Procedure: ICD/BIV ICD FUNCTION (DFT) TEST;  Surgeon: Birmingham Danelle LELON, MD;  Location: [facility] INVASIVE CV LAB;  Service: Cardiovascular;  Laterality: N/A;   IR ANGIO INTRA EXTRACRAN  SEL COM CAROTID INNOMINATE BILAT MOD SED  05/29/2023   IR ANGIO VERTEBRAL SEL SUBCLAVIAN INNOMINATE BILAT MOD SED  05/29/2023   IR RADIOLOGIST EVAL & MGMT  04/16/2023   IR US  GUIDE VASC ACCESS RIGHT  05/29/2023   L1 corpectomy   11/29/2009   Laryngeal polyp excision     OPEN REDUCTION INTERNAL FIXATION (ORIF) DISTAL RADIAL FRACTURE Left 05/11/2024   Procedure: OPEN REDUCTION INTERNAL FIXATION (ORIF) DISTAL RADIUS FRACTURE;  Surgeon: Alyse Agent, MD;  Location: [facility] OR;  Service: Orthopedics;  Laterality: Left;    RIGHT/LEFT HEART CATH AND CORONARY ANGIOGRAPHY N/A 12/18/2020   Procedure: RIGHT/LEFT HEART CATH AND CORONARY ANGIOGRAPHY;  Surgeon: Cherrie Toribio SAUNDERS, MD;  Location: [facility] INVASIVE CV LAB;  Service: Cardiovascular;  Laterality: N/A;   RIGHT/LEFT HEART CATH AND CORONARY ANGIOGRAPHY N/A 03/05/2021   Procedure: RIGHT/LEFT HEART CATH AND CORONARY ANGIOGRAPHY;  Surgeon: Rolan Ezra RAMAN, MD;  Location: [facility] INVASIVE CV LAB;  Service: Cardiovascular;  Laterality: N/A;   TEE WITHOUT CARDIOVERSION N/A 12/01/2020   Procedure: TRANSESOPHAGEAL ECHOCARDIOGRAM (TEE);  Surgeon: Rolan Ezra RAMAN, MD;  Location: [facility] ENDOSCOPY;  Service: Cardiovascular;  Laterality: N/AMERL LULLA BOOM ABLATION N/A 03/08/2021   Procedure: LULLA BOOM ABLATION;  Surgeon: Cindie Ole DASEN, MD;  Location: [facility] INVASIVE CV LAB;  Service: Cardiovascular;  Laterality: N/A;    Family History  Problem Relation Age of Onset   Uterine cancer Mother    Heart disease Father 32   Hyperlipidemia Father    Hypertension Father    Ankylosing spondylitis Sister    Heart disease Sister    Hypertension Sister    Stomach cancer Brother 46   Heart attack Brother    Breast cancer Neg Hx     Social History   Tobacco Use   Smoking status: Former    Current packs/day: 0.00    Average packs/day: 0.3 packs/day for 43.4 years (10.8 ttl pk-yrs)    Types: Cigarettes, E-cigarettes    Start date: 03/03/1978    Quit date: 07/21/2021    Years since quitting: 2.9   Smokeless tobacco: Current   Tobacco comments:    4 ciggs per day  Vaping Use   Vaping status: Every Day  Substance Use Topics   Alcohol use: No    Alcohol/week: 0.0 standard drinks of alcohol   Drug use: No    Comment: Prior history of cocaine    Medications: I have reviewed the patient's current medications. Prior to Admission:  Medications Prior to Admission  Medication Sig Dispense Refill Last Dose/Taking   amiodarone  (PACERONE ) 200 MG tablet TAKE 1 AND 1/2 TABLETS BY MOUTH  MONDAY-THURSDAY. TAKE 2 TABLETS BY MOUTH FRIDAY-SUNDAY (Patient taking differently: Take 100-200 mg by mouth See admin instructions. Take 200 mg every day at bedtime, Take 200 mg Friday, Saturday and Sunday morning, Then take 100 mg Monday - Thursday in morning.) 144 tablet 3 06/25/2024 Evening   DULoxetine  (CYMBALTA ) 60 MG capsule Take 1 capsule (60 mg total) by mouth daily. **NEEDS TO BE SEEN BEFORE NEXT REFILL** 30 capsule 0 06/25/2024 Morning   Evolocumab  (REPATHA ) 140 MG/ML SOSY Inject 140 mg into the skin every 14 (fourteen) days.   06/24/2024   ezetimibe  (ZETIA ) 10 MG tablet TAKE 1 TABLET BY MOUTH EVERY DAY (patient needs office visit FOR further refills) 30 tablet 1 06/25/2024 Morning   mexiletine (MEXITIL ) 150 MG capsule TAKE TWO CAPSULES BY MOUTH THREE TIMES DAILY 540 capsule 3 06/25/2024 Evening   oxyCODONE -acetaminophen  (PERCOCET) 10-325 MG tablet  Take 1 tablet by mouth every 4 (four) hours as needed for pain.   06/25/2024 at  4:00 PM   pantoprazole  (PROTONIX ) 40 MG tablet TAKE 1 TABLET BY MOUTH DAILY 30 tablet 11 06/25/2024 Morning   Vericiguat  (VERQUVO ) 5 MG TABS TAKE 1 TABLET BY MOUTH ONCE DAILY 30 tablet 6 06/25/2024 Morning   Semaglutide -Weight Management (WEGOVY ) 0.5 MG/0.5ML SOAJ Inject 0.5 mg into the skin once a week.   06/24/2024   Scheduled:  amiodarone   100 mg Oral Once per day on Monday Tuesday Wednesday Thursday   amiodarone   200 mg Oral Once per day on Monday Tuesday Wednesday Thursday   amiodarone   200 mg Oral 2 times per day on Sunday Friday Saturday   Chlorhexidine  Gluconate Cloth  6 each Topical Q0600   DULoxetine   60 mg Oral Daily   ezetimibe   10 mg Oral Daily   feeding supplement  237 mL Oral BID BM   mexiletine  300 mg Oral TID   midodrine  10 mg Oral TID WC   pantoprazole   40 mg Oral Daily   polyethylene glycol  17 g Oral BID   Vericiguat   1 tablet Oral Daily   Continuous:  norepinephrine (LEVOPHED) Adult infusion 8 mcg/min (06/28/24 1441)   sodium bicarbonate 75  mEq in sodium chloride  0.45 % 1,075 mL infusion 50 mL/hr at 06/28/24 1440   PRN:ondansetron  (ZOFRAN ) IV, oxyCODONE -acetaminophen  **AND** oxyCODONE , prochlorperazine   Allergies  Allergen Reactions   Beef-Derived Drug Products Anaphylaxis and Nausea Only    From tick bite   Metoclopramide Hcl Anaphylaxis    Non responsive   Penicillins Anaphylaxis    Shock Immediate rash, facial/tongue/throat swelling, SOB or lightheadedness with hypotension   Pork-Derived Products Anaphylaxis and Nausea Only    From tick bite. Has tolerated heparin /lovenox    Metoclopramide Other (See Comments)    Unknown    Metoprolol Other (See Comments)    Syncope    Statins Other (See Comments)    Myalgias     ROS:  A comprehensive review of systems was negative except for: Constitutional: positive for dizziness, weakness Gastrointestinal: positive for lower abdominal pain on the left  Hematologic/lymphatic: positive for bleeding hematoma, INR down to 1.3 from 6.3  Blood pressure (!) 81/49, pulse 99, temperature 99.4 F (37.4 C), temperature source Oral, resp. rate 19, height 5' 5 (1.651 m), weight 92.4 kg, SpO2 91%. Physical Exam Vitals reviewed.  Constitutional:      Comments: pale  HENT:     Head: Normocephalic.     Nose: Nose normal.   Cardiovascular:     Rate and Rhythm: Normal rate.  Pulmonary:     Effort: Pulmonary effort is normal.  Abdominal:     General: There is no distension.     Palpations: Abdomen is soft.     Tenderness: There is abdominal tenderness.     Comments: Bruising in the left lower abdomen, no obvious cellulitis noted, minimally tender    Musculoskeletal:        General: Normal range of motion.   Skin:    General: Skin is warm.   Neurological:     General: No focal deficit present.     Mental Status: She is alert.   Psychiatric:        Mood and Affect: Mood normal.        Behavior: Behavior normal.     Results: Results for orders placed or performed during  the hospital encounter of 06/25/24 (from the past 48 hours)  Hemoglobin and hematocrit, blood     Status: Abnormal   Collection Time: 06/26/24  4:17 PM  Result Value Ref Range   Hemoglobin 11.5 (L) 12.0 - 15.0 g/dL   HCT 65.0 (L) 63.9 - 53.9 %    Comment: Performed at [facility], [address] [place], [place] [address]  Protime-INR     Status: Abnormal   Collection Time: 06/27/24  4:04 AM  Result Value Ref Range   Prothrombin Time 17.8 (H) 11.4 - 15.2 seconds   INR 1.4 (H) 0.8 - 1.2    Comment: (NOTE) INR goal varies based on device and disease states. Performed at [facility], [address] [place], [place] [address]   Renal function panel     Status: Abnormal   Collection Time: 06/27/24  4:04 AM  Result Value Ref Range   Sodium 127 (L) 135 - 145 mmol/L    Comment: DELTA CHECK NOTED   Potassium 3.9 3.5 - 5.1 mmol/L   Chloride 92 (L) 98 - 111 mmol/L   CO2 23 22 - 32 mmol/L   Glucose, Bld 113 (H) 70 - 99 mg/dL    Comment: Glucose reference range applies only to samples taken after fasting for at least 8 hours.   BUN 18 8 - 23 mg/dL   Creatinine, Ser 8.35 (H) 0.44 - 1.00 mg/dL   Calcium  8.2 (L) 8.9 - 10.3 mg/dL   Phosphorus 4.1 2.5 - 4.6 mg/dL   Albumin  3.0 (L) 3.5 - 5.0 g/dL   GFR, Estimated 35 (L) >60 mL/min    Comment: (NOTE) Calculated using the CKD-EPI Creatinine Equation (2021)    Anion gap 12 5 - 15    Comment: Performed at [facility], [address] [place], [place] [address]  CBC     Status: Abnormal   Collection Time: 06/27/24  4:04 AM  Result Value Ref Range   WBC 12.8 (H) 4.0 - 10.5 K/uL   RBC 3.17 (L) 3.87 - 5.11 MIL/uL   Hemoglobin 9.8 (L) 12.0 - 15.0 g/dL   HCT 69.5 (L) 63.9 - 53.9 %   MCV 95.9 80.0 - 100.0 fL   MCH 30.9 26.0 - 34.0 pg   MCHC 32.2 30.0 - 36.0 g/dL   RDW 87.0 88.4 - 84.4 %   Platelets 136 (L) 150 - 400 K/uL   nRBC 0.0 0.0 - 0.2 %    Comment: Performed at [facility], [address], [place], [place] [address]  Hemoglobin and  hematocrit, blood     Status: Abnormal   Collection Time: 06/27/24  5:20 PM  Result Value Ref Range   Hemoglobin 10.2 (L) 12.0 - 15.0 g/dL   HCT 69.2 (L) 63.9 - 53.9 %    Comment: Performed at [facility], [address], [place], [place] [address]  CBC     Status: Abnormal   Collection Time: 06/28/24  5:18 AM  Result Value Ref Range   WBC 14.7 (H) 4.0 - 10.5 K/uL   RBC 2.95 (L) 3.87 - 5.11 MIL/uL   Hemoglobin 9.4 (L) 12.0 - 15.0 g/dL   HCT 71.6 (L) 63.9 - 53.9 %   MCV 95.9 80.0 - 100.0 fL   MCH 31.9 26.0 - 34.0 pg   MCHC 33.2 30.0 - 36.0 g/dL   RDW 86.9 88.4 - 84.4 %   Platelets 134 (L) 150 - 400 K/uL   nRBC 0.0 0.0 - 0.2 %    Comment: Performed at [facility], [address], [place],  [place] [address]  Protime-INR     Status: Abnormal   Collection Time: 06/28/24  5:18 AM  Result Value Ref Range   Prothrombin Time 16.8 (H) 11.4 - 15.2 seconds   INR 1.3 (H) 0.8 - 1.2    Comment: (NOTE) INR goal varies based on device and disease states. Performed at [facility], [address], [place], [place] [address]   Basic metabolic panel with GFR     Status: Abnormal   Collection Time: 06/28/24  5:18 AM  Result Value Ref Range   Sodium 133 (L) 135 - 145 mmol/L   Potassium 4.4 3.5 - 5.1 mmol/L   Chloride 100 98 - 111 mmol/L   CO2 24 22 - 32 mmol/L   Glucose, Bld 108 (H) 70 - 99 mg/dL    Comment: Glucose reference range applies only to samples taken after fasting for at least 8 hours.   BUN 13 8 - 23 mg/dL   Creatinine, Ser 8.74 (H) 0.44 - 1.00 mg/dL   Calcium  8.2 (L) 8.9 - 10.3 mg/dL   GFR, Estimated 49 (L) >60 mL/min    Comment: (NOTE) Calculated using the CKD-EPI Creatinine Equation (2021)    Anion gap 9 5 - 15    Comment: Performed at [facility], [address], [place], [place] [address]  Glucose, capillary     Status: Abnormal   Collection Time: 06/28/24 11:22 AM  Result Value Ref Range   Glucose-Capillary 111 (H) 70 - 99 mg/dL    Comment: Glucose reference range  applies only to samples taken after fasting for at least 8 hours.   *Note: Due to a large number of results and/or encounters for the requested time period, some results have not been displayed. A complete set of results can be found in Results Review.   Personally reviewed her CT from 6/27- large hematoma, ? Active bleeding at that time  ECHOCARDIOGRAM COMPLETE Result Date: 06/27/2024    ECHOCARDIOGRAM REPORT   Patient Name:   [name] [name] Date of Exam: 06/27/2024 Medical Rec #:  [id number]      Height:       65.0 in Accession #:    [id number]     Weight:       203.7 lb Date of Birth:  1961-08-03      BSA:          1.994 m Patient Age:    63 years       BP:           95/51 mmHg Patient Gender: F              HR:           59 bpm. Exam Location:  [facility] Procedure: 2D Echo, Cardiac Doppler and Color Doppler (Both Spectral and Color            Flow Doppler were utilized during procedure). Indications:    R06.9 DOE  History:        Patient has prior history of Echocardiogram examinations, most                 recent 07/09/2023.  Sonographer:    Eva Lash Referring Phys: [id number] COURAGE EMOKPAE  Sonographer Comments: Difficult study due to suboptimal positioning. IMPRESSIONS  1. Left ventricular ejection fraction, by estimation, is 35 to 40%. The left ventricle has mild to moderately decreased function. Left ventricular endocardial border not optimally defined to evaluate regional wall motion. The left ventricular internal cavity size  was moderately dilated. Left ventricular diastolic parameters are consistent with Grade I diastolic dysfunction (impaired relaxation).  2. RV not well visualized, grossly appears normal in size and function. . Right ventricular systolic function was not well visualized. The right ventricular size is not well visualized.  3. Left atrial size was mild to moderately dilated.  4. The mitral valve is abnormal. Mild mitral valve regurgitation. No evidence of mitral stenosis.  5. The  aortic valve is tricuspid. Aortic valve regurgitation is not visualized. No aortic stenosis is present. FINDINGS  Left Ventricle: Left ventricular ejection fraction, by estimation, is 35 to 40%. The left ventricle has mild to moderately decreased function. Left ventricular endocardial border not optimally defined to evaluate regional wall motion. Definity contrast agent was given IV to delineate the left ventricular endocardial borders. The left ventricular internal cavity size was moderately dilated. There is no left ventricular hypertrophy. Left ventricular diastolic parameters are consistent with Grade I diastolic dysfunction (impaired relaxation). Normal left ventricular filling pressure.  LV Wall Scoring: The mid and distal anterior septum, apical lateral segment, and apex are akinetic. The apical anterior segment and apical inferior segment are hypokinetic. Right Ventricle: RV not well visualized, grossly appears normal in size and function. The right ventricular size is not well visualized. Right vetricular wall thickness was not well visualized. Right ventricular systolic function was not well visualized. Left Atrium: Left atrial size was mild to moderately dilated. Right Atrium: Right atrial size was normal in size. Pericardium: There is no evidence of pericardial effusion. Mitral Valve: The mitral valve is abnormal. Mild mitral valve regurgitation. No evidence of mitral valve stenosis. Tricuspid Valve: The tricuspid valve is normal in structure. Tricuspid valve regurgitation is not demonstrated. No evidence of tricuspid stenosis. Aortic Valve: The aortic valve is tricuspid. Aortic valve regurgitation is not visualized. No aortic stenosis is present. Aortic valve mean gradient measures 4.0 mmHg. Aortic valve peak gradient measures 10.2 mmHg. Aortic valve area, by VTI measures 2.16  cm. Pulmonic Valve: The pulmonic valve was not well visualized. Pulmonic valve regurgitation is not visualized. No evidence of  pulmonic stenosis. Aorta: The aortic root and ascending aorta are structurally normal, with no evidence of dilitation. Venous: The inferior vena cava was not well visualized. IAS/Shunts: The interatrial septum was not well visualized. Additional Comments: A device lead is visualized in the right ventricle and right atrium.  LEFT VENTRICLE PLAX 2D LVIDd:         6.60 cm      Diastology LVIDs:         5.60 cm      LV e' medial:    7.18 cm/s LV PW:         1.00 cm      LV E/e' medial:  9.0 LV IVS:        1.00 cm      LV e' lateral:   11.00 cm/s LVOT diam:     2.10 cm      LV E/e' lateral: 5.9 LV SV:         63 LV SV Index:   32 LVOT Area:     3.46 cm  LV Volumes (MOD) LV vol d, MOD A2C: 244.0 ml LV vol d, MOD A4C: 238.0 ml LV vol s, MOD A2C: 155.0 ml LV vol s, MOD A4C: 168.0 ml LV SV MOD A2C:     89.0 ml LV SV MOD A4C:     238.0 ml LV SV MOD BP:  89.6 ml RIGHT VENTRICLE RV S prime:     12.40 cm/s TAPSE (M-mode): 1.2 cm LEFT ATRIUM             Index LA diam:        6.80 cm 3.41 cm/m LA Vol (A2C):   98.7 ml 49.51 ml/m LA Vol (A4C):   73.3 ml 36.77 ml/m LA Biplane Vol: 84.6 ml 42.44 ml/m  AORTIC VALVE AV Area (Vmax):    2.36 cm AV Area (Vmean):   2.21 cm AV Area (VTI):     2.16 cm AV Vmax:           160.00 cm/s AV Vmean:          96.000 cm/s AV VTI:            0.292 m AV Peak Grad:      10.2 mmHg AV Mean Grad:      4.0 mmHg LVOT Vmax:         109.00 cm/s LVOT Vmean:        61.300 cm/s LVOT VTI:          0.182 m LVOT/AV VTI ratio: 0.62  AORTA Ao Root diam: 2.60 cm Ao Asc diam:  2.90 cm MITRAL VALVE                TRICUSPID VALVE MV Area (PHT): 2.36 cm     TR Peak grad:   26.6 mmHg MV Decel Time: 322 msec     TR Vmax:        258.00 cm/s MV E velocity: 64.40 cm/s MV A velocity: 108.00 cm/s  SHUNTS MV E/A ratio:  0.60         Systemic VTI:  0.18 m                             Systemic Diam: 2.10 cm Dorn Ross MD Electronically signed by Dorn Ross MD Signature Date/Time: 06/27/2024/4:42:40 PM    Final       Assessment & Plan:  JANNIFER FISCHLER is a 63 y.o. female with a large abdominal wall hematoma that continues to have drifting H&H and worsening leukocytosis, low grade fever, and remains on levophed. She could be on levophed just from volume loss. CT repeat requested to look at the area. Dr. Pearlean is going to run HCO3 to help prevent AKI.   All questions were answered to the satisfaction of the patient.  CT pending Will see what this shows  If needs I&D of the area, will likely need sedation only given her cardiac history  Discussed with her and need for packing. If area is actively bleeding, will need to see what IR thinks about embolization and IR possible drainage     [name] JAYSON Pander 06/28/2024, 3:14 PM

## 2024-06-28 NOTE — Plan of Care (Signed)
  Problem: Clinical Measurements: Goal: Diagnostic test results will improve Outcome: Progressing Goal: Respiratory complications will improve Outcome: Progressing   Problem: Activity: Goal: Risk for activity intolerance will decrease Outcome: Progressing   

## 2024-06-28 NOTE — Procedures (Signed)
 Procedure Note  06/28/24    Preoperative Diagnosis: Hypotension, pressor requirements, anemia, acute renal injury    Postoperative Diagnosis: Same   Procedure(s) Performed: Central Line placement, right internal jugular    Surgeon: [name] BROCKS. Kallie, MD   Assistants: None   Anesthesia: 1% lidocaine     Complications: None    Indications: Ms. Guarino is a 63 y.o. with worsening hypotension needing more pressors in the setting of anemia related to a large hematoma from a fall. She is also concerned for getting septic of unknown origin with low grade fevers and a leukocytosis. I discussed the risk and benefits of placement of the central line with patient, including but not limited to bleeding, infection, and risk of pneumothorax. She has given consent for the procedure.    Procedure: The patient placed supine. The right chest and neck was prepped and draped in the usual sterile fashion.  Wearing full gown and gloves, I performed the procedure.  One percent lidocaine  was used for local anesthesia. An ultrasound was utilized to assess the jugular vein.  The needle with syringe was advanced into the vein with dark venous return, and a wire was placed using the Seldinger technique without difficulty.  Ectopia was noted briefly but resolved.  The skin was knicked and a dilator was placed, and the three lumen catheter was placed over the wire with continued control of the wire.  There was good draw back of blood from all three lumens and each flushed easily with saline.  The catheter was secured in 4 points with 2-0 silk and a biopatch and dressing was placed.     The patient tolerated the procedure well, and the CXR was ordered to confirm position of the central line.   [name] Kallie, MD [facility] [address] [place], [place] [address] [phone] (office)

## 2024-06-28 NOTE — Progress Notes (Signed)
 [facility]  CT reviewed and looks similar in size, no signs of active extravasation. There is layering. No obvious rim enhancement, will see what radiology says. NPO at midnight pending need for surgery.   CXR ordered for CVL placement. No signs of PTX and line looks in good position. Can use the line.   [name] Pander, MD [facility] [address] [place], [place] [address] [phone] (office)

## 2024-06-28 NOTE — TOC Progression Note (Signed)
 Transition of Care [facility]) - Progression Note    Patient Details  Name: [name] [name] MRN: [id number] Date of Birth: 1961/06/03  Transition of Care [facility]) CM/SW Contact  [place]arth[place] Saddie Kim, [place] Phone Number: 06/28/2024, 3:46 PM  Clinical Narrative:  PT recommending HHPT. Discussed with pt who is agreeable with no preference on agency. Referred and accepted by Dorothe with [facility].           Expected Discharge Plan and Services                                   HH Arranged: PT [facility] Agency: [facility] Date [facility] Agency Contacted: 06/28/24 Time HH Agency Contacted: [id number] Representative spoke with at [facility] Agency: Dorothe   Social Determinants of Health (SDOH) Interventions SDOH Screenings   Food Insecurity: No Food Insecurity (06/26/2024)  Housing: Low Risk  (06/26/2024)  Transportation Needs: No Transportation Needs (06/26/2024)  Utilities: Not At Risk (06/26/2024)  Alcohol Screen: Low Risk  (09/18/2023)  Depression (PHQ2-9): Low Risk  (03/11/2024)  Financial Resource Strain: Low Risk  (03/10/2024)  Physical Activity: Inactive (03/10/2024)  Social Connections: Socially Isolated (06/26/2024)  Stress: Stress Concern Present (03/10/2024)  Tobacco Use: High Risk (06/25/2024)  Health Literacy: Adequate Health Literacy (09/18/2023)    Readmission Risk Interventions    06/26/2024    5:05 PM  Readmission Risk Prevention Plan  Post Dischage Appt Complete  Medication Screening Complete  Transportation Screening Complete

## 2024-06-28 NOTE — Progress Notes (Signed)
 PROGRESS NOTE  [name] [name], is a 63 y.o. female, DOB - 11-09-1961, FMW:[id number]  Admit date - 06/25/2024   Admitting Physician Posey Maier, DO  Outpatient Primary MD for the patient is Lavell Bari LABOR, FNP  LOS - 2  Chief Complaint  Patient presents with   Fall      Brief Summary:- 63 y.o. female with medical history significant of antiphospholipid syndrome on warfarin, CHF, A-fib admitted on 06/26/2024 with abdominal wall hematoma in setting of supratherapeutic INR and follow home   A/p 1)Persistent Hypotension--suspect this is hypovolemic due to acute blood loss in the setting of abdominal wall hematoma due to fall at home PTA in a patient with supratherapeutic INR --Currently requiring IV Levophed for pressure support -- Hold PTA Coreg , Aldactone  and isosorbide  due to hypotension --Continue midodrine 10 mg 3 times daily for pressure support -Try to rule out infection--CT chest abdomen and pelvis without infectious finding -Get blood cultures - Check UA -Check PCT- --check a.m. cortisol - Okay to start IV Rocephin and IV Flagyl after obtaining blood cultures to cover for possible infected intra-abdominal hematoma   2)Abdominal wall Hematoma--- noticed post fall at home in the setting of supratherapeutic INR -Okay to use abdominal binder if that helps her discomfort from bruising and hematoma -Repeat CT AP with contrast done on 06/28/2024 due to persistent hypotension requiring high-dose Levophed despite IV fluids and due to Hgb drifting down-- -CTA AP with oral and IV contrast on 06/28/2024 shows--Persistent abdominal and left lateral thigh hematomas. No findings to suggest active hemorrhage are identified. -- General Surgery consult appreciated--may need surgical intervention -Transfuse 1 unit of PRBC overnight as Hgb continues to drift lower--patient with hemorrhagic shock with increasing Levophed requirement despite IV fluids   3) acute blood loss anemia due to #2  above--with hemorrhagic shock Hgb 14.3 >>13.5 >>10.7 >>11.5>>9.8>>10.2 >>9.4 >>8.8(patient did receive normal saline x 1 L over the last 12 hours) --Suspect slight component of hemodilution due to recent IV fluids - Monitor H&H and transfuse as clinically indicated --Hemoglobin drifting down as above -Transfuse 1 unit of PRBC as above #2 due to hemorrhagic shock   4)Supratherapeutic INR--in the setting of Coumadin  therapy for Antiphospholipid syndrome INR 6.3 >>3.4>> 1.4 >>1.3 -INR normalized after vitamin K x 2 doses --reports poor oral intake over the last few weeks due to Wegovy  therapy--poor oral  intake probably exacerbated her supratherapeutic INR associated with lack of vitamin K intake   5)Fall at home Continue fall precaution Continue PT/OT eval and treat   6)GERD Continue Protonix    7)Chronic systolic CHF -Repeat echo on 06/27/2024 with EF up to 35 to 40% from 30-35 patient back in June 2025 Coreg , Entresto , isosorbide  spironolactone , will be held at this time due to soft BP --Currently appears intravascularly volume depleted due to acute blood loss with hemorrhagic shock   8)Mixed hyperlipidemia Continue Zetia  Statin will be temporarily held due to mild transaminitis She takes Repatha  every 14 days  9)Mild HypoNatremia--- multifactorial - Avoid excessive free water -Currently receiving gentle bicarb drip in the setting of contrast exposure with underlying CKD - Consider sodium chloride  tablets   CRITICAL CARE Performed by: Rendall Carwin   Total critical care time: 47 minutes   Critical care time was exclusive of separately billable procedures and treating other patients. - Persistent hypotension despite IV fluids due to acute blood loss anemia requiring IV Levophed for pressure support -- -Levophed requirement is increasing due to hemorrhagic shock -Titrate up Levophed and transfuse 1  unit of PRBC -General Surgeon placed central line-right IJ on 06/28/2024    Critical care was necessary to treat or prevent imminent or life-threatening deterioration.   Critical care was time spent personally by me on the following activities: development of treatment plan with patient and/or surrogate as well as nursing, discussions with consultants, evaluation of patient's response to treatment, examination of patient, obtaining history from patient or surrogate, ordering and performing treatments and interventions, ordering and review of laboratory studies, ordering and review of radiographic studies, pulse oximetry and re-evaluation of patient's condition.  Status is: Inpatient   Disposition: The patient is from: Home              Anticipated d/c is to: Home              Anticipated d/c date is: > 3 days              Patient currently is not medically stable to d/c. Barriers: Not Clinically Stable-   Code Status :  -  Code Status: Prior   Family Communication:    NA (patient is alert, awake and coherent)   DVT Prophylaxis  :   - SCDs   SCDs Start: 06/26/24 1016 Place TED hose Start: 06/26/24 1016   Lab Results  Component Value Date   PLT 134 (L) 06/28/2024   Inpatient Medications  Scheduled Meds:  amiodarone   100 mg Oral Once per day on Monday Tuesday Wednesday Thursday   amiodarone   200 mg Oral Once per day on Monday Tuesday Wednesday Thursday   amiodarone   200 mg Oral 2 times per day on Sunday Friday Saturday   Chlorhexidine  Gluconate Cloth  6 each Topical Q0600   DULoxetine   60 mg Oral Daily   ezetimibe   10 mg Oral Daily   feeding supplement  237 mL Oral BID BM   mexiletine  300 mg Oral TID   midodrine  10 mg Oral TID WC   pantoprazole   40 mg Oral Daily   Vericiguat   1 tablet Oral Daily   Continuous Infusions:  norepinephrine (LEVOPHED) Adult infusion 6 mcg/min (06/28/24 0752)   PRN Meds:.ondansetron  (ZOFRAN ) IV, oxyCODONE -acetaminophen  **AND** oxyCODONE , prochlorperazine    Anti-infectives (From admission, onward)    None       Subjective: [name] [name] today has no fevers, no emesis,  No chest pain,    - No BM - Voiding well--without dysuria - No significant dyspnea - Unable to wean off Levophed - Increasing Levophed requirement--- with persistently low BP - Pulmonary//general surgeon placed right IJ central line  Objective: Vitals:   06/28/24 1000 06/28/24 1015 06/28/24 1030 06/28/24 1045  BP: (!) 98/37 100/64 95/71 (!) 81/49  Pulse: (!) 101 (!) 101 99 99  Resp: 14 17 (!) 21 19  Temp:      TempSrc:      SpO2: 94% 92%  91%  Weight:      Height:        Intake/Output Summary (Last 24 hours) at 06/28/2024 1127 Last data filed at 06/28/2024 0752 Gross per 24 hour  Intake 1442.27 ml  Output 1250 ml  Net 192.27 ml   Filed Weights   06/25/24 2152 06/27/24 0423  Weight: 88.5 kg 92.4 kg    Physical Exam  Gen:- Awake Alert,  in no apparent distress  HEENT:- [place].AT, No sclera icterus Neck-Supple Neck, right IJ central line Lungs-  CTAB , fair symmetrical air movement CV- S1, S2 normal, regular , AICD in situ Abd-  +ve  B.Sounds, Abd Soft, No tenderness, stable left lower quadrant anterior abdominal wall area and left upper thigh area ecchymosis    Extremity/Skin:- No  edema, pedal pulses present  Psych-affect is appropriate, oriented x3 Neuro-no new focal deficits, no tremors MSK-Lt UE in short forearm cast  Data Reviewed: I have personally reviewed following labs and imaging studies  CBC: Recent Labs  Lab 06/25/24 2239 06/26/24 0147 06/26/24 0937 06/26/24 1617 06/27/24 0404 06/27/24 1720 06/28/24 0518  WBC 11.9*  --   --   --  12.8*  --  14.7*  NEUTROABS 9.6*  --   --   --   --   --   --   HGB 14.3   < > 10.7* 11.5* 9.8* 10.2* 9.4*  HCT 42.5   < > 33.5* 34.9* 30.4* 30.7* 28.3*  MCV 94.7  --   --   --  95.9  --  95.9  PLT 187  --   --   --  136*  --  134*   < > = values in this interval not displayed.   Basic Metabolic Panel: Recent Labs  Lab 06/25/24 2239 06/27/24 0404  06/28/24 0518  NA 134* 127* 133*  K 4.4 3.9 4.4  CL 95* 92* 100  CO2 23 23 24   GLUCOSE 126* 113* 108*  BUN 18 18 13   CREATININE 2.05* 1.64* 1.25*  CALCIUM  8.8* 8.2* 8.2*  PHOS  --  4.1  --    GFR: Estimated Creatinine Clearance: 53.1 mL/min (A) (by C-G formula based on SCr of 1.25 mg/dL (H)). Liver Function Tests: Recent Labs  Lab 06/25/24 2239 06/27/24 0404  AST 43*  --   ALT 47*  --   ALKPHOS 92  --   BILITOT 0.9  --   PROT 7.0  --   ALBUMIN  3.3* 3.0*   Recent Results (from the past 240 hours)  MRSA Next Gen by PCR, Nasal     Status: None   Collection Time: 06/26/24  2:46 AM   Specimen: Nasal Mucosa; Nasal Swab  Result Value Ref Range Status   MRSA by PCR Next Gen NOT DETECTED NOT DETECTED Final    Comment: (NOTE) The GeneXpert MRSA Assay (FDA approved for NASAL specimens only), is one component of a comprehensive MRSA colonization surveillance program. It is not intended to diagnose MRSA infection nor to guide or monitor treatment for MRSA infections. Test performance is not FDA approved in patients less than 70 years old. Performed at [facility], [address], [place], [place] [address]     Radiology Studies: ECHOCARDIOGRAM COMPLETE Result Date: 06/27/2024    ECHOCARDIOGRAM REPORT   Patient Name:   [name] [name] Date of Exam: 06/27/2024 Medical Rec #:  [id number]      Height:       65.0 in Accession #:    [id number]     Weight:       203.7 lb Date of Birth:  1961-09-04      BSA:          1.994 m Patient Age:    63 years       BP:           95/51 mmHg Patient Gender: F              HR:           59 bpm. Exam Location:  [facility] Procedure: 2D Echo, Cardiac Doppler and Color Doppler (Both Spectral and Color  Flow Doppler were utilized during procedure). Indications:    R06.9 DOE  History:        Patient has prior history of Echocardiogram examinations, most                 recent 07/09/2023.  Sonographer:    Eva Lash Referring Phys: [id number] Fransico Sciandra   Sonographer Comments: Difficult study due to suboptimal positioning. IMPRESSIONS  1. Left ventricular ejection fraction, by estimation, is 35 to 40%. The left ventricle has mild to moderately decreased function. Left ventricular endocardial border not optimally defined to evaluate regional wall motion. The left ventricular internal cavity size was moderately dilated. Left ventricular diastolic parameters are consistent with Grade I diastolic dysfunction (impaired relaxation).  2. RV not well visualized, grossly appears normal in size and function. . Right ventricular systolic function was not well visualized. The right ventricular size is not well visualized.  3. Left atrial size was mild to moderately dilated.  4. The mitral valve is abnormal. Mild mitral valve regurgitation. No evidence of mitral stenosis.  5. The aortic valve is tricuspid. Aortic valve regurgitation is not visualized. No aortic stenosis is present. FINDINGS  Left Ventricle: Left ventricular ejection fraction, by estimation, is 35 to 40%. The left ventricle has mild to moderately decreased function. Left ventricular endocardial border not optimally defined to evaluate regional wall motion. Definity contrast agent was given IV to delineate the left ventricular endocardial borders. The left ventricular internal cavity size was moderately dilated. There is no left ventricular hypertrophy. Left ventricular diastolic parameters are consistent with Grade I diastolic dysfunction (impaired relaxation). Normal left ventricular filling pressure.  LV Wall Scoring: The mid and distal anterior septum, apical lateral segment, and apex are akinetic. The apical anterior segment and apical inferior segment are hypokinetic. Right Ventricle: RV not well visualized, grossly appears normal in size and function. The right ventricular size is not well visualized. Right vetricular wall thickness was not well visualized. Right ventricular systolic function was not well  visualized. Left Atrium: Left atrial size was mild to moderately dilated. Right Atrium: Right atrial size was normal in size. Pericardium: There is no evidence of pericardial effusion. Mitral Valve: The mitral valve is abnormal. Mild mitral valve regurgitation. No evidence of mitral valve stenosis. Tricuspid Valve: The tricuspid valve is normal in structure. Tricuspid valve regurgitation is not demonstrated. No evidence of tricuspid stenosis. Aortic Valve: The aortic valve is tricuspid. Aortic valve regurgitation is not visualized. No aortic stenosis is present. Aortic valve mean gradient measures 4.0 mmHg. Aortic valve peak gradient measures 10.2 mmHg. Aortic valve area, by VTI measures 2.16  cm. Pulmonic Valve: The pulmonic valve was not well visualized. Pulmonic valve regurgitation is not visualized. No evidence of pulmonic stenosis. Aorta: The aortic root and ascending aorta are structurally normal, with no evidence of dilitation. Venous: The inferior vena cava was not well visualized. IAS/Shunts: The interatrial septum was not well visualized. Additional Comments: A device lead is visualized in the right ventricle and right atrium.  LEFT VENTRICLE PLAX 2D LVIDd:         6.60 cm      Diastology LVIDs:         5.60 cm      LV e' medial:    7.18 cm/s LV PW:         1.00 cm      LV E/e' medial:  9.0 LV IVS:        1.00 cm  LV e' lateral:   11.00 cm/s LVOT diam:     2.10 cm      LV E/e' lateral: 5.9 LV SV:         63 LV SV Index:   32 LVOT Area:     3.46 cm  LV Volumes (MOD) LV vol d, MOD A2C: 244.0 ml LV vol d, MOD A4C: 238.0 ml LV vol s, MOD A2C: 155.0 ml LV vol s, MOD A4C: 168.0 ml LV SV MOD A2C:     89.0 ml LV SV MOD A4C:     238.0 ml LV SV MOD BP:      89.6 ml RIGHT VENTRICLE RV S prime:     12.40 cm/s TAPSE (M-mode): 1.2 cm LEFT ATRIUM             Index LA diam:        6.80 cm 3.41 cm/m LA Vol (A2C):   98.7 ml 49.51 ml/m LA Vol (A4C):   73.3 ml 36.77 ml/m LA Biplane Vol: 84.6 ml 42.44 ml/m  AORTIC  VALVE AV Area (Vmax):    2.36 cm AV Area (Vmean):   2.21 cm AV Area (VTI):     2.16 cm AV Vmax:           160.00 cm/s AV Vmean:          96.000 cm/s AV VTI:            0.292 m AV Peak Grad:      10.2 mmHg AV Mean Grad:      4.0 mmHg LVOT Vmax:         109.00 cm/s LVOT Vmean:        61.300 cm/s LVOT VTI:          0.182 m LVOT/AV VTI ratio: 0.62  AORTA Ao Root diam: 2.60 cm Ao Asc diam:  2.90 cm MITRAL VALVE                TRICUSPID VALVE MV Area (PHT): 2.36 cm     TR Peak grad:   26.6 mmHg MV Decel Time: 322 msec     TR Vmax:        258.00 cm/s MV E velocity: 64.40 cm/s MV A velocity: 108.00 cm/s  SHUNTS MV E/A ratio:  0.60         Systemic VTI:  0.18 m                             Systemic Diam: 2.10 cm Dorn Ross MD Electronically signed by Dorn Ross MD Signature Date/Time: 06/27/2024/4:42:40 PM    Final    Scheduled Meds:  amiodarone   100 mg Oral Once per day on Monday Tuesday Wednesday Thursday   amiodarone   200 mg Oral Once per day on Monday Tuesday Wednesday Thursday   amiodarone   200 mg Oral 2 times per day on Sunday Friday Saturday   Chlorhexidine  Gluconate Cloth  6 each Topical Q0600   DULoxetine   60 mg Oral Daily   ezetimibe   10 mg Oral Daily   feeding supplement  237 mL Oral BID BM   mexiletine  300 mg Oral TID   midodrine  10 mg Oral TID WC   pantoprazole   40 mg Oral Daily   Vericiguat   1 tablet Oral Daily   Continuous Infusions:  norepinephrine (LEVOPHED) Adult infusion 6 mcg/min (06/28/24 0752)    LOS: 2 days   Rendall Carwin M.D on  06/28/2024 at 11:27 AM  Go to [email or web address] - for contact info  [facility] - Office  [phone]  If 7PM-7AM, please contact night-coverage [email or web address] 06/28/2024, 11:27 AM

## 2024-06-28 NOTE — Consult Note (Signed)
 [facility] Consult  Reason for Consult: Left abdominal wall hematoma, ? Hypotension and on levophed  Referring Physician:  Dr. Pearlean   Chief Complaint   Fall     HPI: [name] [name] is a 63 y.o. female with a recent fall and large left lower abdominal wall hematoma noted on imaging 06/25/24. She had some concern for active bleeding then but was admitted to [facility]. The H&P documents that Dr. Ann (Trauma Surgeon on Call [facility]) reported she can stay at [facility] for management given no surgical intervention indicated. There was no documentation about any question of IR involvement. She had an elevated INR at 6.3 and has been reversed since that time. She has had a drifting H&H since her admission. I was consulted today for her continuing to require levophed in the setting of this hematoma.   Given the size and the prior CT, I asked Dr. Pearlean to repeat imaging to ensure that this is not infected or if it is continuing to bleed, which I think is less likely.    Patient reports some pain in the area. She has CHF and EF 30-35%.   Past Medical History:  Diagnosis Date   AICD (automatic cardioverter/defibrillator) present    Aneurysm of internal carotid artery    Anti-phospholipid syndrome (HCC)    Anticardiolipin antibody positive    Chronic Coumadin    Brain aneurysm    followed by Dr. Dolphus   Chronic combined systolic (congestive) and diastolic (congestive) heart failure (HCC)    Cocaine abuse in remission [facility])    Coronary atherosclerosis of native coronary artery    Previous invasive cardiac testing was done at a facility in [place], [place].  Occluded diagonal, Diffuse LAD disease, Occluded Cx, and non obstructive RCA.    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 Malcolm L1 cage, 5 disc fusion   CARDIAC DEFIBRILLATOR PLACEMENT  2008   [employer] ICD   CARDIOVERSION N/A 12/01/2020   Procedure: CARDIOVERSION;  Surgeon: Rolan Ezra RAMAN, MD;  Location: [facility] ENDOSCOPY;  Service: Cardiovascular;  Laterality: N/A;   CORONARY STENT INTERVENTION N/A 01/08/2021   Procedure: CORONARY STENT INTERVENTION;  Surgeon: Swaziland, Peter M, MD;  Location: [facility] INVASIVE CV LAB;  Service: Cardiovascular;  Laterality: N/A;   EP IMPLANTABLE DEVICE N/A 03/01/2016   Procedure: ICD Generator Changeout;  Surgeon: Danelle LELON Birmingham, MD;  Location: [facility] INVASIVE CV LAB;  Service: Cardiovascular;  Laterality: N/A;   ICD/BIV ICD FUNCTION(DFT) TEST N/A 03/14/2021   Procedure: ICD/BIV ICD FUNCTION (DFT) TEST;  Surgeon: Birmingham Danelle LELON, MD;  [facility]: [facility] INVASIVE CV LAB;  Service: Cardiovascular;  Laterality: N/A;   IR ANGIO INTRA EXTRACRAN  SEL COM CAROTID INNOMINATE BILAT MOD SED  05/29/2023   IR ANGIO VERTEBRAL SEL SUBCLAVIAN INNOMINATE BILAT MOD SED  05/29/2023   IR RADIOLOGIST EVAL & MGMT  04/16/2023   IR US  GUIDE VASC ACCESS RIGHT  05/29/2023   L1 corpectomy   11/29/2009   Laryngeal polyp excision     OPEN REDUCTION INTERNAL FIXATION (ORIF) DISTAL RADIAL FRACTURE Left 05/11/2024   Procedure: OPEN REDUCTION INTERNAL FIXATION (ORIF) DISTAL RADIUS FRACTURE;  Surgeon: Alyse Agent, MD;  Location: [facility] OR;  Service: Orthopedics;  Laterality: Left;    RIGHT/LEFT HEART CATH AND CORONARY ANGIOGRAPHY N/A 12/18/2020   Procedure: RIGHT/LEFT HEART CATH AND CORONARY ANGIOGRAPHY;  Surgeon: Cherrie Toribio SAUNDERS, MD;  Location: [facility] INVASIVE CV LAB;  Service: Cardiovascular;  Laterality: N/A;   RIGHT/LEFT HEART CATH AND CORONARY ANGIOGRAPHY N/A 03/05/2021   Procedure: RIGHT/LEFT HEART CATH AND CORONARY ANGIOGRAPHY;  Surgeon: Rolan Ezra RAMAN, MD;  Location: [facility] INVASIVE CV LAB;  Service: Cardiovascular;  Laterality: N/A;   TEE WITHOUT CARDIOVERSION N/A 12/01/2020   Procedure: TRANSESOPHAGEAL ECHOCARDIOGRAM (TEE);  Surgeon: Rolan Ezra RAMAN, MD;  Location: [facility] ENDOSCOPY;  Service: Cardiovascular;  Laterality: N/AMERL LULLA BOOM ABLATION N/A 03/08/2021   Procedure: LULLA BOOM ABLATION;  Surgeon: Cindie Ole DASEN, MD;  Location: [facility] INVASIVE CV LAB;  Service: Cardiovascular;  Laterality: N/A;    Family History  Problem Relation Age of Onset   Uterine cancer Mother    Heart disease Father 1   Hyperlipidemia Father    Hypertension Father    Ankylosing spondylitis Sister    Heart disease Sister    Hypertension Sister    Stomach cancer Brother 69   Heart attack Brother    Breast cancer Neg Hx     Social History   Tobacco Use   Smoking status: Former    Current packs/day: 0.00    Average packs/day: 0.3 packs/day for 43.4 years (10.8 ttl pk-yrs)    Types: Cigarettes, E-cigarettes    Start date: 03/03/1978    Quit date: 07/21/2021    Years since quitting: 2.9   Smokeless tobacco: Current   Tobacco comments:    4 ciggs per day  Vaping Use   Vaping status: Every Day  Substance Use Topics   Alcohol use: No    Alcohol/week: 0.0 standard drinks of alcohol   Drug use: No    Comment: Prior history of cocaine    Medications: I have reviewed the patient's current medications. Prior to Admission:  Medications Prior to Admission  Medication Sig Dispense Refill Last Dose/Taking   amiodarone  (PACERONE ) 200 MG tablet TAKE 1 AND 1/2 TABLETS BY MOUTH  MONDAY-THURSDAY. TAKE 2 TABLETS BY MOUTH FRIDAY-SUNDAY (Patient taking differently: Take 100-200 mg by mouth See admin instructions. Take 200 mg every day at bedtime, Take 200 mg Friday, Saturday and Sunday morning, Then take 100 mg Monday - Thursday in morning.) 144 tablet 3 06/25/2024 Evening   DULoxetine  (CYMBALTA ) 60 MG capsule Take 1 capsule (60 mg total) by mouth daily. **NEEDS TO BE SEEN BEFORE NEXT REFILL** 30 capsule 0 06/25/2024 Morning   Evolocumab  (REPATHA ) 140 MG/ML SOSY Inject 140 mg into the skin every 14 (fourteen) days.   06/24/2024   ezetimibe  (ZETIA ) 10 MG tablet TAKE 1 TABLET BY MOUTH EVERY DAY (patient needs office visit FOR further refills) 30 tablet 1 06/25/2024 Morning   mexiletine (MEXITIL ) 150 MG capsule TAKE TWO CAPSULES BY MOUTH THREE TIMES DAILY 540 capsule 3 06/25/2024 Evening   oxyCODONE -acetaminophen  (PERCOCET) 10-325 MG tablet  Take 1 tablet by mouth every 4 (four) hours as needed for pain.   06/25/2024 at  4:00 PM   pantoprazole  (PROTONIX ) 40 MG tablet TAKE 1 TABLET BY MOUTH DAILY 30 tablet 11 06/25/2024 Morning   Vericiguat  (VERQUVO ) 5 MG TABS TAKE 1 TABLET BY MOUTH ONCE DAILY 30 tablet 6 06/25/2024 Morning   Semaglutide -Weight Management (WEGOVY ) 0.5 MG/0.5ML SOAJ Inject 0.5 mg into the skin once a week.   06/24/2024   Scheduled:  amiodarone   100 mg Oral Once per day on Monday Tuesday Wednesday Thursday   amiodarone   200 mg Oral Once per day on Monday Tuesday Wednesday Thursday   amiodarone   200 mg Oral 2 times per day on Sunday Friday Saturday   Chlorhexidine  Gluconate Cloth  6 each Topical Q0600   DULoxetine   60 mg Oral Daily   ezetimibe   10 mg Oral Daily   feeding supplement  237 mL Oral BID BM   mexiletine  300 mg Oral TID   midodrine  10 mg Oral TID WC   pantoprazole   40 mg Oral Daily   polyethylene glycol  17 g Oral BID   Vericiguat   1 tablet Oral Daily   Continuous:  norepinephrine (LEVOPHED) Adult infusion 8 mcg/min (06/28/24 1441)   sodium bicarbonate 75  mEq in sodium chloride  0.45 % 1,075 mL infusion 50 mL/hr at 06/28/24 1440   PRN:ondansetron  (ZOFRAN ) IV, oxyCODONE -acetaminophen  **AND** oxyCODONE , prochlorperazine   Allergies  Allergen Reactions   Beef-Derived Drug Products Anaphylaxis and Nausea Only    From tick bite   Metoclopramide Hcl Anaphylaxis    Non responsive   Penicillins Anaphylaxis    Shock Immediate rash, facial/tongue/throat swelling, SOB or lightheadedness with hypotension   Pork-Derived Products Anaphylaxis and Nausea Only    From tick bite. Has tolerated heparin /lovenox    Metoclopramide Other (See Comments)    Unknown    Metoprolol Other (See Comments)    Syncope    Statins Other (See Comments)    Myalgias     ROS:  A comprehensive review of systems was negative except for: Constitutional: positive for dizziness, weakness Gastrointestinal: positive for lower abdominal pain on the left  Hematologic/lymphatic: positive for bleeding hematoma, INR down to 1.3 from 6.3  Blood pressure (!) 81/49, pulse 99, temperature 99.4 F (37.4 C), temperature source Oral, resp. rate 19, height 5' 5 (1.651 m), weight 92.4 kg, SpO2 91%. Physical Exam Vitals reviewed.  Constitutional:      Comments: pale  HENT:     Head: Normocephalic.     Nose: Nose normal.   Cardiovascular:     Rate and Rhythm: Normal rate.  Pulmonary:     Effort: Pulmonary effort is normal.  Abdominal:     General: There is no distension.     Palpations: Abdomen is soft.     Tenderness: There is abdominal tenderness.     Comments: Bruising in the left lower abdomen, no obvious cellulitis noted, minimally tender    Musculoskeletal:        General: Normal range of motion.   Skin:    General: Skin is warm.   Neurological:     General: No focal deficit present.     Mental Status: She is alert.   Psychiatric:        Mood and Affect: Mood normal.        Behavior: Behavior normal.     Results: Results for orders placed or performed during  the hospital encounter of 06/25/24 (from the past 48 hours)  Hemoglobin and hematocrit, blood     Status: Abnormal   Collection Time: 06/26/24  4:17 PM  Result Value Ref Range   Hemoglobin 11.5 (L) 12.0 - 15.0 g/dL   HCT 65.0 (L) 63.9 - 53.9 %    Comment: Performed at [facility], [address] [place], [place] [address]  Protime-INR     Status: Abnormal   Collection Time: 06/27/24  4:04 AM  Result Value Ref Range   Prothrombin Time 17.8 (H) 11.4 - 15.2 seconds   INR 1.4 (H) 0.8 - 1.2    Comment: (NOTE) INR goal varies based on device and disease states. Performed at [facility], [address], [place], [place] [address]   Renal function panel     Status: Abnormal   Collection Time: 06/27/24  4:04 AM  Result Value Ref Range   Sodium 127 (L) 135 - 145 mmol/L    Comment: DELTA CHECK NOTED   Potassium 3.9 3.5 - 5.1 mmol/L   Chloride 92 (L) 98 - 111 mmol/L   CO2 23 22 - 32 mmol/L   Glucose, Bld 113 (H) 70 - 99 mg/dL    Comment: Glucose reference range applies only to samples taken after fasting for at least 8 hours.   BUN 18 8 - 23 mg/dL   Creatinine, Ser 8.35 (H) 0.44 - 1.00 mg/dL   Calcium  8.2 (L) 8.9 - 10.3 mg/dL   Phosphorus 4.1 2.5 - 4.6 mg/dL   Albumin  3.0 (L) 3.5 - 5.0 g/dL   GFR, Estimated 35 (L) >60 mL/min    Comment: (NOTE) Calculated using the CKD-EPI Creatinine Equation (2021)    Anion gap 12 5 - 15    Comment: Performed at [facility], [address], [place], [place] [address]  CBC     Status: Abnormal   Collection Time: 06/27/24  4:04 AM  Result Value Ref Range   WBC 12.8 (H) 4.0 - 10.5 K/uL   RBC 3.17 (L) 3.87 - 5.11 MIL/uL   Hemoglobin 9.8 (L) 12.0 - 15.0 g/dL   HCT 69.5 (L) 63.9 - 53.9 %   MCV 95.9 80.0 - 100.0 fL   MCH 30.9 26.0 - 34.0 pg   MCHC 32.2 30.0 - 36.0 g/dL   RDW 87.0 88.4 - 84.4 %   Platelets 136 (L) 150 - 400 K/uL   nRBC 0.0 0.0 - 0.2 %    Comment: Performed at [facility], [address], [place], [place] [address]  Hemoglobin and  hematocrit, blood     Status: Abnormal   Collection Time: 06/27/24  5:20 PM  Result Value Ref Range   Hemoglobin 10.2 (L) 12.0 - 15.0 g/dL   HCT 69.2 (L) 63.9 - 53.9 %    Comment: Performed at [facility], [address] [place], [place] [address]  CBC     Status: Abnormal   Collection Time: 06/28/24  5:18 AM  Result Value Ref Range   WBC 14.7 (H) 4.0 - 10.5 K/uL   RBC 2.95 (L) 3.87 - 5.11 MIL/uL   Hemoglobin 9.4 (L) 12.0 - 15.0 g/dL   HCT 71.6 (L) 63.9 - 53.9 %   MCV 95.9 80.0 - 100.0 fL   MCH 31.9 26.0 - 34.0 pg   MCHC 33.2 30.0 - 36.0 g/dL   RDW 86.9 88.4 - 84.4 %   Platelets 134 (L) 150 - 400 K/uL   nRBC 0.0 0.0 - 0.2 %    Comment: Performed at [facility], [address] [place],  [place] [address]  Protime-INR     Status: Abnormal   Collection Time: 06/28/24  5:18 AM  Result Value Ref Range   Prothrombin Time 16.8 (H) 11.4 - 15.2 seconds   INR 1.3 (H) 0.8 - 1.2    Comment: (NOTE) INR goal varies based on device and disease states. Performed at [facility], [address], [place], [place] [address]   Basic metabolic panel with GFR     Status: Abnormal   Collection Time: 06/28/24  5:18 AM  Result Value Ref Range   Sodium 133 (L) 135 - 145 mmol/L   Potassium 4.4 3.5 - 5.1 mmol/L   Chloride 100 98 - 111 mmol/L   CO2 24 22 - 32 mmol/L   Glucose, Bld 108 (H) 70 - 99 mg/dL    Comment: Glucose reference range applies only to samples taken after fasting for at least 8 hours.   BUN 13 8 - 23 mg/dL   Creatinine, Ser 8.74 (H) 0.44 - 1.00 mg/dL   Calcium  8.2 (L) 8.9 - 10.3 mg/dL   GFR, Estimated 49 (L) >60 mL/min    Comment: (NOTE) Calculated using the CKD-EPI Creatinine Equation (2021)    Anion gap 9 5 - 15    Comment: Performed at [facility], [address] [place], [place] [address]  Glucose, capillary     Status: Abnormal   Collection Time: 06/28/24 11:22 AM  Result Value Ref Range   Glucose-Capillary 111 (H) 70 - 99 mg/dL    Comment: Glucose reference range  applies only to samples taken after fasting for at least 8 hours.   *Note: Due to a large number of results and/or encounters for the requested time period, some results have not been displayed. A complete set of results can be found in Results Review.   Personally reviewed her CT from 6/27- large hematoma, ? Active bleeding at that time  ECHOCARDIOGRAM COMPLETE Result Date: 06/27/2024    ECHOCARDIOGRAM REPORT   Patient Name:   [name] [name] Date of Exam: 06/27/2024 Medical Rec #:  [id number]      Height:       65.0 in Accession #:    [id number]     Weight:       203.7 lb Date of Birth:  1961/07/12      BSA:          1.994 m Patient Age:    63 years       BP:           95/51 mmHg Patient Gender: F              HR:           59 bpm. Exam [facility] Procedure: 2D Echo, Cardiac Doppler and Color Doppler (Both Spectral and Color            Flow Doppler were utilized during procedure). Indications:    R06.9 DOE  History:        Patient has prior history of Echocardiogram examinations, most                 recent 07/09/2023.  Sonographer:    Eva Lash Referring Phys: [id number] COURAGE EMOKPAE  Sonographer Comments: Difficult study due to suboptimal positioning. IMPRESSIONS  1. Left ventricular ejection fraction, by estimation, is 35 to 40%. The left ventricle has mild to moderately decreased function. Left ventricular endocardial border not optimally defined to evaluate regional wall motion. The left ventricular internal cavity size  was moderately dilated. Left ventricular diastolic parameters are consistent with Grade I diastolic dysfunction (impaired relaxation).  2. RV not well visualized, grossly appears normal in size and function. . Right ventricular systolic function was not well visualized. The right ventricular size is not well visualized.  3. Left atrial size was mild to moderately dilated.  4. The mitral valve is abnormal. Mild mitral valve regurgitation. No evidence of mitral stenosis.  5. The  aortic valve is tricuspid. Aortic valve regurgitation is not visualized. No aortic stenosis is present. FINDINGS  Left Ventricle: Left ventricular ejection fraction, by estimation, is 35 to 40%. The left ventricle has mild to moderately decreased function. Left ventricular endocardial border not optimally defined to evaluate regional wall motion. Definity contrast agent was given IV to delineate the left ventricular endocardial borders. The left ventricular internal cavity size was moderately dilated. There is no left ventricular hypertrophy. Left ventricular diastolic parameters are consistent with Grade I diastolic dysfunction (impaired relaxation). Normal left ventricular filling pressure.  LV Wall Scoring: The mid and distal anterior septum, apical lateral segment, and apex are akinetic. The apical anterior segment and apical inferior segment are hypokinetic. Right Ventricle: RV not well visualized, grossly appears normal in size and function. The right ventricular size is not well visualized. Right vetricular wall thickness was not well visualized. Right ventricular systolic function was not well visualized. Left Atrium: Left atrial size was mild to moderately dilated. Right Atrium: Right atrial size was normal in size. Pericardium: There is no evidence of pericardial effusion. Mitral Valve: The mitral valve is abnormal. Mild mitral valve regurgitation. No evidence of mitral valve stenosis. Tricuspid Valve: The tricuspid valve is normal in structure. Tricuspid valve regurgitation is not demonstrated. No evidence of tricuspid stenosis. Aortic Valve: The aortic valve is tricuspid. Aortic valve regurgitation is not visualized. No aortic stenosis is present. Aortic valve mean gradient measures 4.0 mmHg. Aortic valve peak gradient measures 10.2 mmHg. Aortic valve area, by VTI measures 2.16  cm. Pulmonic Valve: The pulmonic valve was not well visualized. Pulmonic valve regurgitation is not visualized. No evidence of  pulmonic stenosis. Aorta: The aortic root and ascending aorta are structurally normal, with no evidence of dilitation. Venous: The inferior vena cava was not well visualized. IAS/Shunts: The interatrial septum was not well visualized. Additional Comments: A device lead is visualized in the right ventricle and right atrium.  LEFT VENTRICLE PLAX 2D LVIDd:         6.60 cm      Diastology LVIDs:         5.60 cm      LV e' medial:    7.18 cm/s LV PW:         1.00 cm      LV E/e' medial:  9.0 LV IVS:        1.00 cm      LV e' lateral:   11.00 cm/s LVOT diam:     2.10 cm      LV E/e' lateral: 5.9 LV SV:         63 LV SV Index:   32 LVOT Area:     3.46 cm  LV Volumes (MOD) LV vol d, MOD A2C: 244.0 ml LV vol d, MOD A4C: 238.0 ml LV vol s, MOD A2C: 155.0 ml LV vol s, MOD A4C: 168.0 ml LV SV MOD A2C:     89.0 ml LV SV MOD A4C:     238.0 ml LV SV MOD BP:  89.6 ml RIGHT VENTRICLE RV S prime:     12.40 cm/s TAPSE (M-mode): 1.2 cm LEFT ATRIUM             Index LA diam:        6.80 cm 3.41 cm/m LA Vol (A2C):   98.7 ml 49.51 ml/m LA Vol (A4C):   73.3 ml 36.77 ml/m LA Biplane Vol: 84.6 ml 42.44 ml/m  AORTIC VALVE AV Area (Vmax):    2.36 cm AV Area (Vmean):   2.21 cm AV Area (VTI):     2.16 cm AV Vmax:           160.00 cm/s AV Vmean:          96.000 cm/s AV VTI:            0.292 m AV Peak Grad:      10.2 mmHg AV Mean Grad:      4.0 mmHg LVOT Vmax:         109.00 cm/s LVOT Vmean:        61.300 cm/s LVOT VTI:          0.182 m LVOT/AV VTI ratio: 0.62  AORTA Ao Root diam: 2.60 cm Ao Asc diam:  2.90 cm MITRAL VALVE                TRICUSPID VALVE MV Area (PHT): 2.36 cm     TR Peak grad:   26.6 mmHg MV Decel Time: 322 msec     TR Vmax:        258.00 cm/s MV E velocity: 64.40 cm/s MV A velocity: 108.00 cm/s  SHUNTS MV E/A ratio:  0.60         Systemic VTI:  0.18 m                             Systemic Diam: 2.10 cm Dorn Ross MD Electronically signed by Dorn Ross MD Signature Date/Time: 06/27/2024/4:42:40 PM    Final       Assessment & Plan:  KEIRAH KONITZER is a 63 y.o. female with a large abdominal wall hematoma that continues to have drifting H&H and worsening leukocytosis, low grade fever, and remains on levophed. She could be on levophed just from volume loss. CT repeat requested to look at the area. Dr. Pearlean is going to run HCO3 to help prevent AKI.   All questions were answered to the satisfaction of the patient.  CT pending Will see what this shows  If needs I&D of the area, will likely need sedation only given her cardiac history  Discussed with her and need for packing. If area is actively bleeding, will need to see what IR thinks about embolization and IR possible drainage     [name] JAYSON Pander 06/28/2024, 3:14 PM

## 2024-06-29 ENCOUNTER — Encounter (HOSPITAL_COMMUNITY): Admission: EM | Disposition: E | Payer: Self-pay | Source: Home / Self Care | Attending: Internal Medicine

## 2024-06-29 DIAGNOSIS — Z7901 Long term (current) use of anticoagulants: Secondary | ICD-10-CM | POA: Diagnosis not present

## 2024-06-29 DIAGNOSIS — I959 Hypotension, unspecified: Secondary | ICD-10-CM | POA: Diagnosis not present

## 2024-06-29 DIAGNOSIS — W19XXXA Unspecified fall, initial encounter: Secondary | ICD-10-CM | POA: Diagnosis not present

## 2024-06-29 DIAGNOSIS — I5022 Chronic systolic (congestive) heart failure: Secondary | ICD-10-CM | POA: Diagnosis not present

## 2024-06-29 DIAGNOSIS — S301XXA Contusion of abdominal wall, initial encounter: Secondary | ICD-10-CM | POA: Diagnosis not present

## 2024-06-29 DIAGNOSIS — N179 Acute kidney failure, unspecified: Secondary | ICD-10-CM

## 2024-06-29 LAB — BPAM RBC
Blood Product Expiration Date: 202507052359
ISSUE DATE / TIME: 202506302306
Unit Type and Rh: 6200

## 2024-06-29 LAB — CBC
HCT: 28.7 % — ABNORMAL LOW (ref 36.0–46.0)
Hemoglobin: 9.8 g/dL — ABNORMAL LOW (ref 12.0–15.0)
MCH: 32.1 pg (ref 26.0–34.0)
MCHC: 34.1 g/dL (ref 30.0–36.0)
MCV: 94.1 fL (ref 80.0–100.0)
Platelets: 145 10*3/uL — ABNORMAL LOW (ref 150–400)
RBC: 3.05 MIL/uL — ABNORMAL LOW (ref 3.87–5.11)
RDW: 14 % (ref 11.5–15.5)
WBC: 16.8 10*3/uL — ABNORMAL HIGH (ref 4.0–10.5)
nRBC: 0.1 % (ref 0.0–0.2)

## 2024-06-29 LAB — TYPE AND SCREEN
ABO/RH(D): A POS
Antibody Screen: NEGATIVE
Unit division: 0

## 2024-06-29 LAB — BASIC METABOLIC PANEL WITH GFR
Anion gap: 7 (ref 5–15)
BUN: 12 mg/dL (ref 8–23)
CO2: 25 mmol/L (ref 22–32)
Calcium: 7.9 mg/dL — ABNORMAL LOW (ref 8.9–10.3)
Chloride: 97 mmol/L — ABNORMAL LOW (ref 98–111)
Creatinine, Ser: 0.98 mg/dL (ref 0.44–1.00)
GFR, Estimated: >60 mL/min (ref >60)
Glucose, Bld: 110 mg/dL — ABNORMAL HIGH (ref 70–99)
Potassium: 3.8 mmol/L (ref 3.5–5.1)
Sodium: 129 mmol/L — ABNORMAL LOW (ref 135–145)

## 2024-06-29 LAB — TSH: TSH: 3.467 u[IU]/mL (ref 0.350–4.500)

## 2024-06-29 LAB — SODIUM, URINE, RANDOM: Sodium, Ur: <10 mmol/L

## 2024-06-29 LAB — OSMOLALITY, URINE: Osmolality, Ur: 222 mosm/kg — ABNORMAL LOW (ref 300–900)

## 2024-06-29 LAB — CORTISOL-AM, BLOOD: Cortisol - AM: 11.4 ug/dL (ref 6.7–22.6)

## 2024-06-29 SURGERY — INCISION AND DRAINAGE, ABSCESS
Anesthesia: General | Laterality: Left

## 2024-06-29 MED ORDER — SIMETHICONE 80 MG PO CHEW
80.0000 mg | CHEWABLE_TABLET | Freq: Once | ORAL | Status: AC
  Administered 2024-06-29: 80 mg via ORAL
  Filled 2024-06-29: qty 1

## 2024-06-29 MED ORDER — ALUM & MAG HYDROXIDE-SIMETH 200-200-20 MG/5ML PO SUSP
30.0000 mL | ORAL | Status: DC | PRN
  Administered 2024-06-29 – 2024-07-06 (×4): 30 mL via ORAL
  Filled 2024-06-29 (×4): qty 30

## 2024-06-29 NOTE — Plan of Care (Signed)
  Problem: Acute Rehab OT Goals (only OT should resolve) Goal: Pt. Will Perform Grooming Flowsheets (Taken 06/29/2024 0940) Pt Will Perform Grooming:  with modified independence  standing Goal: Pt. Will Perform Lower Body Bathing Flowsheets (Taken 06/29/2024 0940) Pt Will Perform Lower Body Bathing:  with modified independence  sitting/lateral leans Goal: Pt. Will Perform Lower Body Dressing Flowsheets (Taken 06/29/2024 0940) Pt Will Perform Lower Body Dressing:  with modified independence  sitting/lateral leans Goal: Pt. Will Transfer To Toilet Flowsheets (Taken 06/29/2024 0940) Pt Will Transfer to Toilet:  with modified independence  ambulating Goal: Pt. Will Perform Toileting-Clothing Manipulation Flowsheets (Taken 06/29/2024 0940) Pt Will Perform Toileting - Clothing Manipulation and hygiene: with modified independence Goal: Pt/Caregiver Will Perform Home Exercise Program Flowsheets (Taken 06/29/2024 0940) Pt/caregiver will Perform Home Exercise Program:  Increased strength  Increased ROM  Both right and left upper extremity  Independently  Tasmin Exantus OT, MOT

## 2024-06-29 NOTE — Progress Notes (Signed)
 Physical Therapy Treatment Patient Details Name: [name] [name] MRN: [id number] DOB: Jun 12, 1961 Today's Date: 06/29/2024   History of Present Illness [name] [name] is a 63 y.o. female with medical history significant of antiphospholipid syndrome on warfarin, CHF, A-fib who presents to the emergency department after sustaining a fall and had an abdominal wall injury.  Patient states that she sustained a fall yesterday in the afternoon around 2 PM when her walker hit something resulting in her losing balance and fell landing on walker and striking left side of her abdomen with subsequent bruise to the area.  She denies hitting head or losing consciousness.  She decided to go to the ED for evaluation due to being on warfarin and being concerned of internal bleeding.  Patient also complained of nausea and vomiting since she recently started Wegovy .  She had difficulty in being able to keep any food down.    PT Comments  Patient demonstrates labored movement for sitting up at bedside mostly due to limited use of LUE having to wear wrist/hand cast. Patient c/o mild increase in dizziness upon standing with BP increasing to 128/85 from 96/78 when sitting and limited to a few side steps at bedside mostly due to patient continuing to c/o dizziness symptoms. Patient tolerated sitting up in chair after therapy - nurse aware. Patient will benefit from continued skilled physical therapy in hospital and recommended venue below to increase strength, balance, endurance for safe ADLs and gait.      If plan is discharge home, recommend the following: A little help with walking and/or transfers;Help with stairs or ramp for entrance;Assistance with cooking/housework;A lot of help with bathing/dressing/bathroom   Can travel by private vehicle        Equipment Recommendations  None recommended by PT    Recommendations for Other Services       Precautions / Restrictions Precautions Precautions: Fall Recall of  Precautions/Restrictions: Intact Restrictions Weight Bearing Restrictions Per Provider Order: Yes LUE Weight Bearing Per Provider Order: Non weight bearing Other Position/Activity Restrictions: use platform walker for LUE     Mobility  Bed Mobility Overal bed mobility: Needs Assistance Bed Mobility: Supine to Sit     Supine to sit: Min assist     General bed mobility comments: Hand held assist using RUE for supine to sitting    Transfers Overall transfer level: Needs assistance Equipment used: Rolling walker (2 wheels) Transfers: Sit to/from Stand, Bed to chair/wheelchair/BSC Sit to Stand: Contact guard assist   Step pivot transfers: Contact guard assist       General transfer comment: slightly labored movement, c/o dizziness upon standing    Ambulation/Gait Ambulation/Gait assistance: Min assist Gait Distance (Feet): 3 Feet Assistive device: Rolling walker (2 wheels) Gait Pattern/deviations: Decreased step length - right, Decreased step length - left, Decreased stride length Gait velocity: slow     General Gait Details: limited to a few side steps at bedside mostly due to c/o dizziness   Stairs             Wheelchair Mobility     Tilt Bed    Modified Rankin (Stroke Patients Only)       Balance Overall balance assessment: Needs assistance Sitting-balance support: Feet supported, No upper extremity supported Sitting balance-Leahy Scale: Fair Sitting balance - Comments: fair/good seated at EOB   Standing balance support: During functional activity, Bilateral upper extremity supported Standing balance-Leahy Scale: Fair Standing balance comment: using RW  Communication Communication Communication: No apparent difficulties  Cognition Arousal: Alert Behavior During Therapy: WFL for tasks assessed/performed   PT - Cognitive impairments: No apparent impairments                         Following  commands: Intact      Cueing Cueing Techniques: Verbal cues  Exercises      General Comments        Pertinent Vitals/Pain Pain Assessment Pain Assessment: Faces Faces Pain Scale: Hurts a little bit Pain Location: back; L flank Pain Descriptors / Indicators: Discomfort Pain Intervention(s): Limited activity within patient's tolerance, Monitored during session, Repositioned    Home Living                          Prior Function            PT Goals (current goals can now be found in the care plan section) Acute Rehab PT Goals Patient Stated Goal: To walk with confidence PT Goal Formulation: With patient Time For Goal Achievement: 07/06/24 Potential to Achieve Goals: Good Progress towards PT goals: Progressing toward goals    Frequency    Min 3X/week      PT Plan      Co-evaluation PT/OT/SLP Co-Evaluation/Treatment: Yes Reason for Co-Treatment: To address functional/ADL transfers PT goals addressed during session: Mobility/safety with mobility;Balance;Proper use of DME        AM-PAC PT 6 Clicks Mobility   Outcome Measure  Help needed turning from your back to your side while in a flat bed without using bedrails?: None Help needed moving from lying on your back to sitting on the side of a flat bed without using bedrails?: A Little Help needed moving to and from a bed to a chair (including a wheelchair)?: A Little Help needed standing up from a chair using your arms (e.g., wheelchair or bedside chair)?: A Little Help needed to walk in hospital room?: A Little Help needed climbing 3-5 steps with a railing? : A Lot 6 Click Score: 18    End of Session   Activity Tolerance: Patient tolerated treatment well;Patient limited by fatigue Patient left: in chair;with call bell/phone within reach Nurse Communication: Mobility status PT Visit Diagnosis: Unsteadiness on feet (R26.81);Repeated falls (R29.6);Muscle weakness (generalized) (M62.81)      Time: 9191-9167 PT Time Calculation (min) (ACUTE ONLY): 24 min  Charges:    $Therapeutic Activity: 23-37 mins PT General Charges $$ ACUTE PT VISIT: 1 Visit                     2:26 PM, 06/29/24 Lynwood Music, MPT Physical Therapist with [facility] [phone] office [phone] mobile phone

## 2024-06-29 NOTE — Progress Notes (Signed)
 [facility] Progress Note     Subjective: Procalcitonin low and CT with concern for hematoma but no abscess. Discussed with Dr. Pearlean today and given no overt signs that the hematoma is infected, holding on surgery for I*D today. This could still potentially get secondarily infected, but will see how things progress in the next few days.   CVL placed and blood received yesterday.   Objective: Vital signs in last 24 hours: Temp:  [97.6 F (36.4 C)-98.5 F (36.9 C)] 97.8 F (36.6 C) (07/01 1236) Pulse Rate:  [35-115] 84 (07/01 1300) Resp:  [14-36] 24 (07/01 1300) BP: (39-138)/(23-89) 114/63 (07/01 1300) SpO2:  [69 %-98 %] 95 % (07/01 1300) Last BM Date : 06/24/24  Intake/Output from previous day: 06/30 0701 - 07/01 0700 In: 2740.5 [P.O.:720; I.V.:1445.5; Blood:375; IV Piggyback:200] Out: 2200 [Urine:2100; Emesis/NG output:100] Intake/Output this shift: Total I/O In: -  Out: 850 [Urine:850]  General appearance: alert and no distress GI: soft, nondistended minimally tender, hematoma and bruising in the left lower abdomen and left thigh, no erythema or signs of cellulitis   Lab Results:  Recent Labs    06/28/24 0518 06/28/24 1904 06/29/24 0418  WBC 14.7*  --  16.8*  HGB 9.4* 8.8* 9.8*  HCT 28.3* 26.1* 28.7*  PLT 134*  --  145*   BMET Recent Labs    06/28/24 0518 06/29/24 0418  NA 133* 129*  K 4.4 3.8  CL 100 97*  CO2 24 25  GLUCOSE 108* 110*  BUN 13 12  CREATININE 1.25* 0.98  CALCIUM  8.2* 7.9*   PT/INR Recent Labs    06/27/24 0404 06/28/24 0518  LABPROT 17.8* 16.8*  INR 1.4* 1.3*    Studies/Results: CT ABDOMEN PELVIS W CONTRAST Result Date: 06/28/2024 CLINICAL DATA:  Follow-up abdominal wall hematoma EXAM: CT ABDOMEN AND PELVIS WITH CONTRAST TECHNIQUE: Multidetector CT imaging of the abdomen and pelvis was performed using the standard protocol following bolus administration of intravenous contrast. RADIATION DOSE REDUCTION: This exam  was performed according to the departmental dose-optimization program which includes automated exposure control, adjustment of the mA and/or kV according to patient size and/or use of iterative reconstruction technique. CONTRAST:  OMNIPAQUE  IOHEXOL  300 MG/ML  SOLN COMPARISON:  06/25/2024 FINDINGS: Lower chest: Minimal right basilar atelectasis is noted. Hepatobiliary: Multiple gallstones are noted within a dilated gallbladder. The liver is within normal limits. Pancreas: Unremarkable. No pancreatic ductal dilatation or surrounding inflammatory changes. Spleen: Normal in size without focal abnormality. Adrenals/Urinary Tract: Adrenal glands are within normal limits. Kidneys demonstrate a normal enhancement pattern bilaterally. No renal calculi or obstructive changes are seen. The ureters are within normal limits. The bladder is well distended. Stomach/Bowel: Appendix is not visualized consistent with prior surgical history. No obstructive or inflammatory changes of colon are seen. Small bowel and stomach appear within normal limits. Vascular/Lymphatic: Aortic atherosclerosis. No enlarged abdominal or pelvic lymph nodes. Reproductive: Uterus and bilateral adnexa are unremarkable. Other: No abdominal wall hernia or abnormality. No abdominopelvic ascites. Musculoskeletal: Postsurgical changes are noted in the lumbar spine stable from the prior exam. No acute bony abnormality is noted. In the left anterior and lateral abdominal wall there is again noted a large hematoma with fluid fluid level within. This measures up to 13 cm in greatest transverse diameter and extends for approximately 15.4 cm in craniocaudad projection. Previously seen hyperdense areas within are no longer identified. No findings to suggest active hemorrhage are seen. The collection in the left lateral thigh is incompletely evaluated on  this exam but appears stable. IMPRESSION: Persistent abdominal and left lateral thigh hematomas. No findings to  suggest active hemorrhage are identified. Cholelithiasis without complicating factors. No acute abnormality noted. Electronically Signed   By: Oneil Devonshire M.D.   On: 06/28/2024 19:33   DG CHEST PORT 1 VIEW Result Date: 06/28/2024 CLINICAL DATA:  Status post central line placement EXAM: PORTABLE CHEST 1 VIEW COMPARISON:  06/25/2024 FINDINGS: Cardiac shadow is stable. Defibrillator is again noted and stable. New right jugular central line is noted with the tip in the superior right atrium. No pneumothorax is seen. The overall inspiratory effort is poor. Persistent scarring is noted in the left mid lung. No free air is noted. IMPRESSION: No pneumothorax following central line placement Electronically Signed   By: Oneil Devonshire M.D.   On: 06/28/2024 19:27   US  EKG SITE RITE Result Date: 06/28/2024 If Site Rite image not attached, placement could not be confirmed due to current cardiac rhythm.  ECHOCARDIOGRAM COMPLETE Result Date: 06/27/2024    ECHOCARDIOGRAM REPORT   Patient Name:   [name] [name] Date of Exam: 06/27/2024 Medical Rec #:  [id number]      Height:       65.0 in Accession #:    [id number]     Weight:       203.7 lb Date of Birth:  08-30-1961      BSA:          1.994 m Patient Age:    63 years       BP:           95/51 mmHg Patient Gender: F              HR:           59 bpm. Exam Location:  [facility] Procedure: 2D Echo, Cardiac Doppler and Color Doppler (Both Spectral and Color            Flow Doppler were utilized during procedure). Indications:    R06.9 DOE  History:        Patient has prior history of Echocardiogram examinations, most                 recent 07/09/2023.  Sonographer:    Eva Lash Referring Phys: [id number] COURAGE EMOKPAE  Sonographer Comments: Difficult study due to suboptimal positioning. IMPRESSIONS  1. Left ventricular ejection fraction, by estimation, is 35 to 40%. The left ventricle has mild to moderately decreased function. Left ventricular endocardial border not optimally  defined to evaluate regional wall motion. The left ventricular internal cavity size was moderately dilated. Left ventricular diastolic parameters are consistent with Grade I diastolic dysfunction (impaired relaxation).  2. RV not well visualized, grossly appears normal in size and function. . Right ventricular systolic function was not well visualized. The right ventricular size is not well visualized.  3. Left atrial size was mild to moderately dilated.  4. The mitral valve is abnormal. Mild mitral valve regurgitation. No evidence of mitral stenosis.  5. The aortic valve is tricuspid. Aortic valve regurgitation is not visualized. No aortic stenosis is present. FINDINGS  Left Ventricle: Left ventricular ejection fraction, by estimation, is 35 to 40%. The left ventricle has mild to moderately decreased function. Left ventricular endocardial border not optimally defined to evaluate regional wall motion. Definity contrast agent was given IV to delineate the left ventricular endocardial borders. The left ventricular internal cavity size was moderately dilated. There is no left ventricular hypertrophy. Left ventricular diastolic parameters are  consistent with Grade I diastolic dysfunction (impaired relaxation). Normal left ventricular filling pressure.  LV Wall Scoring: The mid and distal anterior septum, apical lateral segment, and apex are akinetic. The apical anterior segment and apical inferior segment are hypokinetic. Right Ventricle: RV not well visualized, grossly appears normal in size and function. The right ventricular size is not well visualized. Right vetricular wall thickness was not well visualized. Right ventricular systolic function was not well visualized. Left Atrium: Left atrial size was mild to moderately dilated. Right Atrium: Right atrial size was normal in size. Pericardium: There is no evidence of pericardial effusion. Mitral Valve: The mitral valve is abnormal. Mild mitral valve regurgitation. No  evidence of mitral valve stenosis. Tricuspid Valve: The tricuspid valve is normal in structure. Tricuspid valve regurgitation is not demonstrated. No evidence of tricuspid stenosis. Aortic Valve: The aortic valve is tricuspid. Aortic valve regurgitation is not visualized. No aortic stenosis is present. Aortic valve mean gradient measures 4.0 mmHg. Aortic valve peak gradient measures 10.2 mmHg. Aortic valve area, by VTI measures 2.16  cm. Pulmonic Valve: The pulmonic valve was not well visualized. Pulmonic valve regurgitation is not visualized. No evidence of pulmonic stenosis. Aorta: The aortic root and ascending aorta are structurally normal, with no evidence of dilitation. Venous: The inferior vena cava was not well visualized. IAS/Shunts: The interatrial septum was not well visualized. Additional Comments: A device lead is visualized in the right ventricle and right atrium.  LEFT VENTRICLE PLAX 2D LVIDd:         6.60 cm      Diastology LVIDs:         5.60 cm      LV e' medial:    7.18 cm/s LV PW:         1.00 cm      LV E/e' medial:  9.0 LV IVS:        1.00 cm      LV e' lateral:   11.00 cm/s LVOT diam:     2.10 cm      LV E/e' lateral: 5.9 LV SV:         63 LV SV Index:   32 LVOT Area:     3.46 cm  LV Volumes (MOD) LV vol d, MOD A2C: 244.0 ml LV vol d, MOD A4C: 238.0 ml LV vol s, MOD A2C: 155.0 ml LV vol s, MOD A4C: 168.0 ml LV SV MOD A2C:     89.0 ml LV SV MOD A4C:     238.0 ml LV SV MOD BP:      89.6 ml RIGHT VENTRICLE RV S prime:     12.40 cm/s TAPSE (M-mode): 1.2 cm LEFT ATRIUM             Index LA diam:        6.80 cm 3.41 cm/m LA Vol (A2C):   98.7 ml 49.51 ml/m LA Vol (A4C):   73.3 ml 36.77 ml/m LA Biplane Vol: 84.6 ml 42.44 ml/m  AORTIC VALVE AV Area (Vmax):    2.36 cm AV Area (Vmean):   2.21 cm AV Area (VTI):     2.16 cm AV Vmax:           160.00 cm/s AV Vmean:          96.000 cm/s AV VTI:            0.292 m AV Peak Grad:      10.2 mmHg AV Mean Grad:  4.0 mmHg LVOT Vmax:         109.00 cm/s  LVOT Vmean:        61.300 cm/s LVOT VTI:          0.182 m LVOT/AV VTI ratio: 0.62  AORTA Ao Root diam: 2.60 cm Ao Asc diam:  2.90 cm MITRAL VALVE                TRICUSPID VALVE MV Area (PHT): 2.36 cm     TR Peak grad:   26.6 mmHg MV Decel Time: 322 msec     TR Vmax:        258.00 cm/s MV E velocity: 64.40 cm/s MV A velocity: 108.00 cm/s  SHUNTS MV E/A ratio:  0.60         Systemic VTI:  0.18 m                             Systemic Diam: 2.10 cm Dorn Ross MD Electronically signed by Dorn Ross MD Signature Date/Time: 06/27/2024/4:42:40 PM    Final     Anti-infectives: Anti-infectives (From admission, onward)    Start     Dose/Rate Route Frequency Ordered Stop   06/28/24 2200  cefTRIAXone (ROCEPHIN) 2 g in sodium chloride  0.9 % 100 mL IVPB        2 g 200 mL/hr over 30 Minutes Intravenous Every 24 hours 06/28/24 2032     06/28/24 2200  metroNIDAZOLE (FLAGYL) IVPB 500 mg        500 mg 100 mL/hr over 60 Minutes Intravenous Every 12 hours 06/28/24 2032         Assessment/Plan: Patient with large hematoma after a fall. CT repeat without obvious signs of infection and procalcitonin reassuring 0.16. Held on surgery for now. Will monitor area and her vitals. She remains on levophed and got blood yesterday.   Monitor for now.    LOS: 3 days    [name] JAYSON Pander 06/29/2024

## 2024-06-29 NOTE — Evaluation (Signed)
 Occupational Therapy Evaluation Patient Details Name: [name] [name] MRN: [id number] DOB: 1961/10/02 Today's Date: 06/29/2024   History of Present Illness   [name] [name] is a 63 y.o. female with medical history significant of antiphospholipid syndrome on warfarin, CHF, A-fib who presents to the emergency department after sustaining a fall and had an abdominal wall injury.  Patient states that she sustained a fall yesterday in the afternoon around 2 PM when her walker hit something resulting in her losing balance and fell landing on walker and striking left side of her abdomen with subsequent bruise to the area.  She denies hitting head or losing consciousness.  She decided to go to the ED for evaluation due to being on warfarin and being concerned of internal bleeding.  Patient also complained of nausea and vomiting since she recently started Wegovy .  She had difficulty in being able to keep any food down. (per MD)     Clinical Impressions Pt agreeable to OT and PT co-evaluation. Pt reports being independent at baseline. Today pt was limited by blood pressure issues. Pt was initially removed from supplemental O2 but this had to be put back in place at 2LPM due to pt desaturating to mid 80s. Pt unsteady at times when standing with reports of lightheaded ness. Orthostatics taken during session. CGA for transfer to chair with RW. Mod A at this time for lower body ADL's due to pt difficult managing L LE due to L flank pain. B UE generally weak with pt donning a splint on L wrist from a previous injury. Pt was left in the chair with call bell within reach. Pt will benefit from continued OT in the hospital and recommended venue below to increase strength, balance, and endurance for safe ADL's.        If plan is discharge home, recommend the following:   A little help with walking and/or transfers;A little help with bathing/dressing/bathroom;Assistance with cooking/housework;Assist for  transportation;Help with stairs or ramp for entrance     Functional Status Assessment   Patient has had a recent decline in their functional status and demonstrates the ability to make significant improvements in function in a reasonable and predictable amount of time.     Equipment Recommendations   None recommended by OT             Precautions/Restrictions   Precautions Precautions: Fall Recall of Precautions/Restrictions: Intact Restrictions Weight Bearing Restrictions Per Provider Order: No     Mobility Bed Mobility Overal bed mobility: Needs Assistance Bed Mobility: Supine to Sit     Supine to sit: Min assist     General bed mobility comments: Single hand held assist to pull to sit.    Transfers Overall transfer level: Needs assistance Equipment used: Rolling walker (2 wheels) Transfers: Sit to/from Stand, Bed to chair/wheelchair/BSC Sit to Stand: Contact guard assist     Step pivot transfers: Contact guard assist     General transfer comment: CGA due to reports of dizziness/lightheadedness intermitently.      Balance Overall balance assessment: Needs assistance Sitting-balance support: No upper extremity supported, Feet supported Sitting balance-Leahy Scale: Fair Sitting balance - Comments: fair to good seated at EOB   Standing balance support: Bilateral upper extremity supported, During functional activity Standing balance-Leahy Scale: Fair Standing balance comment: using RW                           ADL either performed or assessed with  clinical judgement   ADL Overall ADL's : Needs assistance/impaired     Grooming: Set up;Sitting       Lower Body Bathing: Moderate assistance;Sitting/lateral leans   Upper Body Dressing : Set up;Sitting   Lower Body Dressing: Moderate assistance;Sitting/lateral leans Lower Body Dressing Details (indicate cue type and reason): Mod A due to difficulty with L LE; able to doff and don sock  from R LE while seated at EOB. Toilet Transfer: Contact guard assist;Stand-pivot;Rolling walker (2 wheels) Toilet Transfer Details (indicate cue type and reason): Simulated via EOB to chair transfer. Toileting- Clothing Manipulation and Hygiene: Minimal assistance;Moderate assistance;Sitting/lateral lean       Functional mobility during ADLs: Contact guard assist;Rolling walker (2 wheels)       Vision Baseline Vision/History: 1 Wears glasses Ability to See in Adequate Light: 1 Impaired Patient Visual Report: Other (comment) (blurry vision when having blood pressure issues.) Vision Assessment?: No apparent visual deficits     Perception Perception: Not tested       Praxis Praxis: Not tested       Pertinent Vitals/Pain Pain Assessment Pain Assessment: Faces Faces Pain Scale: Hurts a little bit Pain Location: back; L flank Pain Descriptors / Indicators: Discomfort Pain Intervention(s): Monitored during session, Repositioned     Extremity/Trunk Assessment Upper Extremity Assessment Upper Extremity Assessment: Generalized weakness;LUE deficits/detail (General weakness other than L wrist splint.) LUE Deficits / Details: L wrist in a splint due to previous injury.   Lower Extremity Assessment Lower Extremity Assessment: Defer to PT evaluation   Cervical / Trunk Assessment Cervical / Trunk Assessment: Normal   Communication Communication Communication: No apparent difficulties   Cognition Arousal: Alert Behavior During Therapy: WFL for tasks assessed/performed Cognition: No apparent impairments                               Following commands: Intact       Cueing  General Comments   Cueing Techniques: Verbal cues                 Home Living Family/patient expects to be discharged to:: Private residence Living Arrangements: Alone Available Help at Discharge: Friend(s) Type of Home: Mobile home Home Access: Stairs to enter [employer] of Steps: 3 Entrance Stairs-Rails: Right Home Layout: One level     Bathroom Shower/Tub: Producer, television/film/video: Standard Bathroom Accessibility: Yes   Home Equipment: Other (comment);Shower seat;BSC/3in1;Rolling Environmental consultant (2 wheels) (platform walker)   Additional Comments: platform walker; pt planning on getting a ramp to enter her home      Prior Functioning/Environment Prior Level of Function : Independent/Modified Independent             Mobility Comments: Pt was ambulating with a platform walker since her wrist injury. Prior to that the pt would often use a RW. ADLs Comments: Independent ADL; oders groceries. Given rides to appointments.    OT Problem List: Decreased strength;Decreased range of motion;Decreased activity tolerance;Impaired balance (sitting and/or standing);Pain   OT Treatment/Interventions: Self-care/ADL training;Therapeutic exercise;DME and/or AE instruction;Therapeutic activities;Cognitive remediation/compensation;Patient/family education;Balance training      OT Goals(Current goals can be found in the care plan section)   Acute Rehab OT Goals Patient Stated Goal: return home OT Goal Formulation: With patient Time For Goal Achievement: 07/13/24 Potential to Achieve Goals: Good   OT Frequency:  Min 2X/week    Co-evaluation PT/OT/SLP Co-Evaluation/Treatment: Yes Reason for Co-Treatment: To address functional/ADL transfers  OT goals addressed during session: ADL's and self-care                       End of Session Equipment Utilized During Treatment: Rolling walker (2 wheels);Oxygen  Activity Tolerance: Patient tolerated treatment well Patient left: in chair;with call bell/phone within reach  OT Visit Diagnosis: Unsteadiness on feet (R26.81);Other abnormalities of gait and mobility (R26.89);Muscle weakness (generalized) (M62.81);Dizziness and giddiness (R42)                Time: 9185-9166 OT Time Calculation  (min): 19 min Charges:  OT General Charges $OT Visit: 1 Visit OT Evaluation $OT Eval Low Complexity: 1 Low  Savahanna Almendariz OT, MOT   Jayson Person 06/29/2024, 9:39 AM

## 2024-06-29 NOTE — Plan of Care (Signed)

## 2024-06-29 NOTE — Progress Notes (Signed)
 PROGRESS NOTE  [name] [name], is a 63 y.o. female, DOB - June 17, 1961, FMW:[id number]  Admit date - 06/25/2024   Admitting Physician Posey Maier, DO  Outpatient Primary MD for the patient is [name] [name] LABOR, FNP  LOS - 3  Chief Complaint  Patient presents with   Fall      Brief Summary:- 63 y.o. female with medical history significant of antiphospholipid syndrome on warfarin, CHF, A-fib admitted on 06/26/2024 with abdominal wall hematoma in setting of supratherapeutic INR and follow home   A/p 1)Persistent Hypotension--suspect this is hypovolemic due to acute blood loss (hemorrhagic shock) in the setting of abdominal wall hematoma due to fall at home PTA in a patient with supratherapeutic INR --Currently requiring IV Levophed for pressure support -- Hold PTA Coreg , Aldactone  and isosorbide  due to hypotension --Continue midodrine 10 mg 3 times daily for pressure support -Try to rule out infection FROM 06/28/24----CT chest abdomen and pelvis without infectious finding -Get blood cultures--NGTD - Check UA-Not suggestive of UTI -Check PCT- 0.16 --check a.m. cortisol--11.4 -Concern for possible superimposed intra-abdominal infection on patient's hematoma -WBC 12.8 >>14.7 >>16.8 PCT 0.16 - Okay to Start IV Rocephin and IV Flagyl after obtaining blood cultures to cover for possible infected intra-abdominal hematoma   2)Abdominal wall Hematoma--- noticed post fall at home in the setting of supratherapeutic INR -Okay to use abdominal binder if that helps her discomfort from bruising and hematoma -Repeat CT AP with contrast done on 06/28/2024 due to persistent hypotension requiring high-dose Levophed despite IV fluids and due to Hgb drifting down-- -CTA AP with oral and IV contrast on 06/28/2024 shows--Persistent abdominal and left lateral thigh hematomas. No findings to suggest active hemorrhage are identified. -- General Surgery consult appreciated--may need surgical intervention - Hgb 9.8  after transfusion of 1 unit of PRBC overnight  --patient with hemorrhagic shock with increasing Levophed requirement despite IV fluids   3) acute blood loss anemia due to #2 above--with hemorrhagic shock Hgb 14.3 >>13.5 >>10.7 >>11.5>>9.8>>10.2 >>9.4 >>8.8>>9.8 (patient did receive normal saline x 1 L over the last 12 hours) --Suspect slight component of hemodilution due to recent IV fluids - Monitor H&H and transfuse as clinically indicated - Hgb 9.8 after transfusion of 1 unit of PRBC overnight    4)Supratherapeutic INR--in the setting of Coumadin  therapy for Antiphospholipid syndrome INR 6.3 >>3.4>> 1.4 >>1.3 -INR normalized after vitamin K x 2 doses --reports poor oral intake over the last few weeks due to Wegovy  therapy--poor oral  intake probably exacerbated her supratherapeutic INR associated with lack of vitamin K intake   5)Fall at home Continue fall precaution Continue PT/OT eval and treat   6)GERD Continue Protonix    7)Chronic systolic CHF -Repeat echo on 06/27/2024 with EF up to 35 to 40% from 30-35 % back in June 2025 Continue to hold Coreg , Entresto , isosorbide  spironolactone  due to soft BP --Currently appears intravascularly volume depleted due to acute blood loss with hemorrhagic shock   8)Mixed hyperlipidemia Continue Zetia  Statin will be temporarily held due to mild transaminitis She also takes Repatha  every 14 days  9)Mild Acute HypoNatremia--- multifactorial - Avoid excessive free water -Urine osmolarity, serum osmolarity, urine sodium , uric acid pending -A.m. cortisol is 11.4 (WNL) -TSH WNL --Be judicious with IV fluids as patient's EF is around 35% - Consider sodium chloride  tablets   CRITICAL CARE Performed by: Rendall Carwin   Total critical care time: 51 minutes   Critical care time was exclusive of separately billable procedures and treating other patients. -  Persistent hypotension despite IV fluids due to acute blood loss anemia requiring IV  Levophed for pressure support --  Critical care was necessary to treat or prevent imminent or life-threatening deterioration.   Critical care was time spent personally by me on the following activities: development of treatment plan with patient and/or surrogate as well as nursing, discussions with consultants, evaluation of patient's response to treatment, examination of patient, obtaining history from patient or surrogate, ordering and performing treatments and interventions, ordering and review of laboratory studies, ordering and review of radiographic studies, pulse oximetry and re-evaluation of patient's condition.  Status is: Inpatient   Disposition: The patient is from: Home              Anticipated d/c is to: Home              Anticipated d/c date is: > 3 days              Patient currently is not medically stable to d/c. Barriers: Not Clinically Stable-   Code Status :  -  Code Status: Prior   Family Communication:    NA (patient is alert, awake and coherent)   DVT Prophylaxis  :   - SCDs   SCDs Start: 06/26/24 1016 Place TED hose Start: 06/26/24 1016   Lab Results  Component Value Date   PLT 145 (L) 06/29/2024   Inpatient Medications  Scheduled Meds:  amiodarone   100 mg Oral Once per day on Monday Tuesday Wednesday Thursday   amiodarone   200 mg Oral Once per day on Monday Tuesday Wednesday Thursday   amiodarone   200 mg Oral 2 times per day on Sunday Friday Saturday   Chlorhexidine Gluconate Cloth  6 each Topical Q0600   DULoxetine  60 mg Oral Daily   ezetimibe  10 mg Oral Daily   feeding supplement  237 mL Oral BID BM   mexiletine  300 mg Oral TID   midodrine  10 mg Oral TID WC   pantoprazole  40 mg Oral Daily   polyethylene glycol  17 g Oral BID   Vericiguat  1 tablet Oral Daily   Continuous Infusions:  cefTRIAXone (ROCEPHIN)  IV Stopped (06/28/24 2313)   metronidazole Stopped (06/29/24 1018)   norepinephrine (LEVOPHED) Adult infusion 17 mcg/min (06/29/24 1645)    PRN Meds:.alum & mag hydroxide-simeth, ondansetron (ZOFRAN) IV, oxyCODONE-acetaminophen **AND** oxyCODONE, prochlorperazine   Anti-infectives (From admission, onward)    Start     Dose/Rate Route Frequency Ordered Stop   06/28/24 2200  cefTRIAXone (ROCEPHIN) 2 g in sodium chloride 0.9 % 100 mL IVPB        2 g 200 mL/hr over 30 Minutes Intravenous Every 24 hours 06/28/24 2032     06/28/24 2200  metroNIDAZOLE (FLAGYL) IVPB 500 mg        50 0 mg 100 mL/hr over 60 Minutes Intravenous Every 12 hours 06/28/24 2032        Subjective: Reshunda Strider today has no fevers, no emesis,  No chest pain,    No cough or dyspnea - Hypotension persist Levophed increased to 16 mcg/hr - Abdominal pain is not worse  Objective: Vitals:   06/29/24 1715 06/29/24 1730 06/29/24 1745 06/29/24 1800  BP: 129/71 (!) 136/52 (!) 146/72 129/67  Pulse: 88 88 100 93  Resp: 20 18 19  (!) 27  Temp:      TempSrc:      SpO2: 95% 96% 96% 94%  Weight:      Height:  Intake/Output Summary (Last 24 hours) at 06/29/2024 1819 Last data filed at 06/29/2024 1645 Gross per 24 hour  Intake 2361.85 ml  Output 3500 ml  Net -1138.15 ml   Filed Weights   06/25/24 2152 06/27/24 0423  Weight: 88.5 kg 92.4 kg    Physical Exam  Gen:- Awake Alert,  in no apparent distress  HEENT:- [place].AT, No sclera icterus Neck-Supple Neck, right IJ central line Lungs-  CTAB , fair symmetrical air movement CV- S1, S2 normal, regular , AICD in situ Abd-  +ve B.Sounds, Abd Soft, No tenderness, stable , somewhat resolving left lower quadrant anterior abdominal wall area and left upper thigh area ecchymosis    Extremity/Skin:- +ve  edema, pedal pulses present  Psych-affect is appropriate, oriented x3 Neuro-no new focal deficits, no tremors MSK-Lt UE in short forearm cast  Data Reviewed: I have personally reviewed following labs and imaging studies  CBC: Recent Labs  Lab 06/25/24 2239 06/26/24 0147 06/27/24 0404 06/27/24 1720  06/28/24 0518 06/28/24 1904 06/29/24 0418  WBC 11.9*  --  12.8*  --  14.7*  --  16.8*  NEUTROABS 9.6*  --   --   --   --   --   --   HGB 14.3   < > 9.8* 10.2* 9.4* 8.8* 9.8*  HCT 42.5   < > 30.4* 30.7* 28.3* 26.1* 28.7*  MCV 94.7  --  95.9  --  95.9  --  94.1  PLT 187  --  136*  --  134*  --  145*   < > = values in this interval not displayed.   Basic Metabolic Panel: Recent Labs  Lab 06/25/24 2239 06/27/24 0404 06/28/24 0518 06/29/24 0418  NA 134* 127* 133* 129*  K 4.4 3.9 4.4 3.8  CL 95* 92* 100 97*  CO2 23 23 24 25   GLUCOSE 126* 113* 108* 110*  BUN 18 18 13 12   CREATININE 2.05* 1.64* 1.25* 0.98  CALCIUM  8.8* 8.2* 8.2* 7.9*  PHOS  --  4.1  --   --    GFR: Estimated Creatinine Clearance: 67.8 mL/min (by C-G formula based on SCr of 0.98 mg/dL). Liver Function Tests: Recent Labs  Lab 06/25/24 2239 06/27/24 0404  AST 43*  --   ALT 47*  --   ALKPHOS 92  --   BILITOT 0.9  --   PROT 7.0  --   ALBUMIN  3.3* 3.0*   Recent Results (from the past 240 hours)  MRSA Next Gen by PCR, Nasal     Status: None   Collection Time: 06/26/24  2:46 AM   Specimen: Nasal Mucosa; Nasal Swab  Result Value Ref Range Status   MRSA by PCR Next Gen NOT DETECTED NOT DETECTED Final    Comment: (NOTE) The GeneXpert MRSA Assay (FDA approved for NASAL specimens only), is one component of a comprehensive MRSA colonization surveillance program. It is not intended to diagnose MRSA infection nor to guide or monitor treatment for MRSA infections. Test performance is not FDA approved in patients less than 8 years old. Performed at [facility], [address], [place], [place] [address]   Culture, blood (Routine X 2) w Reflex to ID Panel     Status: None (Preliminary result)   Collection Time: 06/28/24  7:04 PM   Specimen: BLOOD  Result Value Ref Range Status   Specimen Description BLOOD BLOOD RIGHT HAND AEROBIC BOTTLE ONLY  Final   Special Requests NONE  Final   Culture   Final  NO GROWTH <  12 HOURS Performed at [facility], [address], [place], [place] [address]    Report Status PENDING  Incomplete  Culture, blood (Routine X 2) w Reflex to ID Panel     Status: None (Preliminary result)   Collection Time: 06/28/24  8:05 PM   Specimen: BLOOD  Result Value Ref Range Status   Specimen Description BLOOD BLOOD RIGHT HAND  Final   Special Requests NONE  Final   Culture   Final    NO GROWTH < 12 HOURS Performed at [facility], [address], [place], [place] [address]    Report Status PENDING  Incomplete    Radiology Studies: CT ABDOMEN PELVIS W CONTRAST Result Date: 06/28/2024 CLINICAL DATA:  Follow-up abdominal wall hematoma EXAM: CT ABDOMEN AND PELVIS WITH CONTRAST TECHNIQUE: Multidetector CT imaging of the abdomen and pelvis was performed using the standard protocol following bolus administration of intravenous contrast. RADIATION DOSE REDUCTION: This exam was performed according to the departmental dose-optimization program which includes automated exposure control, adjustment of the mA and/or kV according to patient size and/or use of iterative reconstruction technique. CONTRAST:  OMNIPAQUE  IOHEXOL  300 MG/ML  SOLN COMPARISON:  06/25/2024 FINDINGS: Lower chest: Minimal right basilar atelectasis is noted. Hepatobiliary: Multiple gallstones are noted within a dilated gallbladder. The liver is within normal limits. Pancreas: Unremarkable. No pancreatic ductal dilatation or surrounding inflammatory changes. Spleen: Normal in size without focal abnormality. Adrenals/Urinary Tract: Adrenal glands are within normal limits. Kidneys demonstrate a normal enhancement pattern bilaterally. No renal calculi or obstructive changes are seen. The ureters are within normal limits. The bladder is well distended. Stomach/Bowel: Appendix is not visualized consistent with prior surgical history. No obstructive or inflammatory changes of colon are seen. Small bowel and stomach appear within normal  limits. Vascular/Lymphatic: Aortic atherosclerosis. No enlarged abdominal or pelvic lymph nodes. Reproductive: Uterus and bilateral adnexa are unremarkable. Other: No abdominal wall hernia or abnormality. No abdominopelvic ascites. Musculoskeletal: Postsurgical changes are noted in the lumbar spine stable from the prior exam. No acute bony abnormality is noted. In the left anterior and lateral abdominal wall there is again noted a large hematoma with fluid fluid level within. This measures up to 13 cm in greatest transverse diameter and extends for approximately 15.4 cm in craniocaudad projection. Previously seen hyperdense areas within are no longer identified. No findings to suggest active hemorrhage are seen. The collection in the left lateral thigh is incompletely evaluated on this exam but appears stable. IMPRESSION: Persistent abdominal and left lateral thigh hematomas. No findings to suggest active hemorrhage are identified. Cholelithiasis without complicating factors. No acute abnormality noted. Electronically Signed   By: Oneil Devonshire M.D.   On: 06/28/2024 19:33   DG CHEST PORT 1 VIEW Result Date: 06/28/2024 CLINICAL DATA:  Status post central line placement EXAM: PORTABLE CHEST 1 VIEW COMPARISON:  06/25/2024 FINDINGS: Cardiac shadow is stable. Defibrillator is again noted and stable. New right jugular central line is noted with the tip in the superior right atrium. No pneumothorax is seen. The overall inspiratory effort is poor. Persistent scarring is noted in the left mid lung. No free air is noted. IMPRESSION: No pneumothorax following central line placement Electronically Signed   By: Oneil Devonshire M.D.   On: 06/28/2024 19:27   US  EKG SITE RITE Result Date: 06/28/2024 If Site Rite image not attached, placement could not be confirmed due to current cardiac rhythm.  Scheduled Meds:  amiodarone   100 mg Oral Once per day  on Monday Tuesday Wednesday Thursday   amiodarone   200 mg Oral Once per day on  Monday Tuesday Wednesday Thursday   amiodarone   200 mg Oral 2 times per day on Sunday Friday Saturday   Chlorhexidine  Gluconate Cloth  6 each Topical Q0600   DULoxetine   60 mg Oral Daily   ezetimibe   10 mg Oral Daily   feeding supplement  237 mL Oral BID BM   mexiletine  300 mg Oral TID   midodrine  10 mg Oral TID WC   pantoprazole   40 mg Oral Daily   polyethylene glycol  17 g Oral BID   Vericiguat   1 tablet Oral Daily   Continuous Infusions:  cefTRIAXone (ROCEPHIN)  IV Stopped (06/28/24 2313)   metronidazole Stopped (06/29/24 1018)   norepinephrine (LEVOPHED) Adult infusion 17 mcg/min (06/29/24 1645)    LOS: 3 days   Rendall Carwin M.D on 06/29/2024 at 6:19 PM  Go to [email or web address] - for contact info  [facility] - Office  [phone]  If 7PM-7AM, please contact night-coverage [email or web address] 06/29/2024, 6:19 PM

## 2024-06-29 DEATH — deceased

## 2024-06-30 ENCOUNTER — Encounter

## 2024-06-30 DIAGNOSIS — S301XXD Contusion of abdominal wall, subsequent encounter: Secondary | ICD-10-CM

## 2024-06-30 DIAGNOSIS — I4819 Other persistent atrial fibrillation: Secondary | ICD-10-CM | POA: Diagnosis not present

## 2024-06-30 DIAGNOSIS — N179 Acute kidney failure, unspecified: Secondary | ICD-10-CM | POA: Diagnosis not present

## 2024-06-30 DIAGNOSIS — I959 Hypotension, unspecified: Secondary | ICD-10-CM

## 2024-06-30 DIAGNOSIS — Z7901 Long term (current) use of anticoagulants: Secondary | ICD-10-CM | POA: Diagnosis not present

## 2024-06-30 LAB — POCT I-STAT, CHEM 8
BUN: 23 mg/dL (ref 8–23)
Calcium, Ion: 0.95 mmol/L — ABNORMAL LOW (ref 1.15–1.40)
Chloride: 97 mmol/L — ABNORMAL LOW (ref 98–111)
Creatinine, Ser: 2.1 mg/dL — ABNORMAL HIGH (ref 0.44–1.00)
Glucose, Bld: 126 mg/dL — ABNORMAL HIGH (ref 70–99)
HCT: 44 % (ref 36.0–46.0)
Hemoglobin: 15 g/dL (ref 12.0–15.0)
Potassium: 5.2 mmol/L — ABNORMAL HIGH (ref 3.5–5.1)
Sodium: 132 mmol/L — ABNORMAL LOW (ref 135–145)
TCO2: 24 mmol/L (ref 22–32)

## 2024-06-30 LAB — CBC WITH DIFFERENTIAL/PLATELET
Abs Immature Granulocytes: 0.08 10*3/uL — ABNORMAL HIGH (ref 0.00–0.07)
Basophils Absolute: 0.1 10*3/uL (ref 0.0–0.1)
Basophils Relative: 0 %
Eosinophils Absolute: 0 10*3/uL (ref 0.0–0.5)
Eosinophils Relative: 0 %
HCT: 31.9 % — ABNORMAL LOW (ref 36.0–46.0)
Hemoglobin: 10.6 g/dL — ABNORMAL LOW (ref 12.0–15.0)
Immature Granulocytes: 1 %
Lymphocytes Relative: 8 %
Lymphs Abs: 1.1 10*3/uL (ref 0.7–4.0)
MCH: 31.4 pg (ref 26.0–34.0)
MCHC: 33.2 g/dL (ref 30.0–36.0)
MCV: 94.4 fL (ref 80.0–100.0)
Monocytes Absolute: 1.4 10*3/uL — ABNORMAL HIGH (ref 0.1–1.0)
Monocytes Relative: 10 %
Neutro Abs: 11.9 10*3/uL — ABNORMAL HIGH (ref 1.7–7.7)
Neutrophils Relative %: 81 %
Platelets: 212 10*3/uL (ref 150–400)
RBC: 3.38 MIL/uL — ABNORMAL LOW (ref 3.87–5.11)
RDW: 14.5 % (ref 11.5–15.5)
WBC: 14.6 10*3/uL — ABNORMAL HIGH (ref 4.0–10.5)
nRBC: 0.1 % (ref 0.0–0.2)

## 2024-06-30 LAB — COMPREHENSIVE METABOLIC PANEL WITH GFR
ALT: 53 U/L — ABNORMAL HIGH (ref 0–44)
AST: 41 U/L (ref 15–41)
Albumin: 2.5 g/dL — ABNORMAL LOW (ref 3.5–5.0)
Alkaline Phosphatase: 71 U/L (ref 38–126)
Anion gap: 7 (ref 5–15)
BUN: 9 mg/dL (ref 8–23)
CO2: 26 mmol/L (ref 22–32)
Calcium: 8.1 mg/dL — ABNORMAL LOW (ref 8.9–10.3)
Chloride: 98 mmol/L (ref 98–111)
Creatinine, Ser: 0.82 mg/dL (ref 0.44–1.00)
GFR, Estimated: >60 mL/min (ref >60)
Glucose, Bld: 120 mg/dL — ABNORMAL HIGH (ref 70–99)
Potassium: 3.6 mmol/L (ref 3.5–5.1)
Sodium: 131 mmol/L — ABNORMAL LOW (ref 135–145)
Total Bilirubin: 0.9 mg/dL (ref 0.0–1.2)
Total Protein: 5.8 g/dL — ABNORMAL LOW (ref 6.5–8.1)

## 2024-06-30 LAB — PROTIME-INR
INR: 1.4 — ABNORMAL HIGH (ref 0.8–1.2)
Prothrombin Time: 18.1 s — ABNORMAL HIGH (ref 11.4–15.2)

## 2024-06-30 LAB — URIC ACID: Uric Acid, Serum: 3.1 mg/dL (ref 2.5–7.1)

## 2024-06-30 LAB — OSMOLALITY: Osmolality: 280 mosm/kg (ref 275–295)

## 2024-06-30 MED ORDER — MILK AND MOLASSES ENEMA
1.0000 | Freq: Once | RECTAL | Status: AC
  Administered 2024-06-30: 150 mL via RECTAL

## 2024-06-30 NOTE — Plan of Care (Signed)

## 2024-06-30 NOTE — Progress Notes (Signed)
 Heart Failure Navigator Progress Note  Assessed for Heart & Vascular TOC clinic readiness.  Patient does not meet criteria due to current Advanced Heart Failure Team patient of Dr. Ezra. Rolan, MD. Patient has a scheduled appointment on 07/26/24 @ 3:00 PM at the CHF clinic at [facility].  Navigator will sign off at this time.  Charmaine Pines, RN, BSN [facility] Heart Failure Navigator Secure Chat Only

## 2024-06-30 NOTE — Consult Note (Addendum)
 WOC Nurse Consult Note: Reason for Consult: L thumb wound  Wound type:Stage 3 Pressure Injury r/t pressure and friction from cast  Pressure Injury POA: NA  Measurement: see nursing flowsheet  Wound bed: 50% tan fibrinous 50% red  Drainage (amount, consistency, odor)  see nursing flowsheet  Periwound: appears macerated  Dressing procedure/placement/frequency: Cleanse L thumb wound with Vashe wound cleanser Soila [id number]) do not rinse and allow to air dry.  Apply a small piece of silver hydrofiber ([employer] Lawson [id number]) cut to fit wound bed daily and secure with silicone foam or gauze and tape whichever is preferred.    POC discussed with bedside nurse. WOC team will not follow.  Re-consult if further needs arise.   Thank you,   Powell Bar MSN, RN-BC, [employer]

## 2024-06-30 NOTE — Progress Notes (Signed)
 [facility] Progress Note     Subjective: No major issues. Still on levophed.   Objective: Vital signs in last 24 hours: Temp:  [97.1 F (36.2 C)-98.4 F (36.9 C)] 97.8 F (36.6 C) (07/02 1106) Pulse Rate:  [70-110] 110 (07/02 1106) Resp:  [15-34] 24 (07/02 1106) BP: (93-146)/(37-91) 112/64 (07/02 1106) SpO2:  [88 %-100 %] 97 % (07/02 1045) Weight:  [94.9 kg] 94.9 kg (07/02 0306) Last BM Date : 06/24/24  Intake/Output from previous day: 07/01 0701 - 07/02 0700 In: 1577.3 [I.V.:1377.3; IV Piggyback:200] Out: 2850 [Urine:2550; Emesis/NG output:300] Intake/Output this shift: No intake/output data recorded.  GI: left lower abdomen with bruising but no redness or signs of infection, minimally tender  Lab Results:  Recent Labs    06/29/24 0418 06/30/24 0411  WBC 16.8* 14.6*  HGB 9.8* 10.6*  HCT 28.7* 31.9*  PLT 145* 212   BMET Recent Labs    06/29/24 0418 06/30/24 0411  NA 129* 131*  K 3.8 3.6  CL 97* 98  CO2 25 26  GLUCOSE 110* 120*  BUN 12 9  CREATININE 0.98 0.82  CALCIUM  7.9* 8.1*   PT/INR Recent Labs    06/28/24 0518 06/30/24 0411  LABPROT 16.8* 18.1*  INR 1.3* 1.4*    Studies/Results: CT ABDOMEN PELVIS W CONTRAST Result Date: 06/28/2024 CLINICAL DATA:  Follow-up abdominal wall hematoma EXAM: CT ABDOMEN AND PELVIS WITH CONTRAST TECHNIQUE: Multidetector CT imaging of the abdomen and pelvis was performed using the standard protocol following bolus administration of intravenous contrast. RADIATION DOSE REDUCTION: This exam was performed according to the departmental dose-optimization program which includes automated exposure control, adjustment of the mA and/or kV according to patient size and/or use of iterative reconstruction technique. CONTRAST:  OMNIPAQUE  IOHEXOL  300 MG/ML  SOLN COMPARISON:  06/25/2024 FINDINGS: Lower chest: Minimal right basilar atelectasis is noted. Hepatobiliary: Multiple gallstones are noted within a dilated  gallbladder. The liver is within normal limits. Pancreas: Unremarkable. No pancreatic ductal dilatation or surrounding inflammatory changes. Spleen: Normal in size without focal abnormality. Adrenals/Urinary Tract: Adrenal glands are within normal limits. Kidneys demonstrate a normal enhancement pattern bilaterally. No renal calculi or obstructive changes are seen. The ureters are within normal limits. The bladder is well distended. Stomach/Bowel: Appendix is not visualized consistent with prior surgical history. No obstructive or inflammatory changes of colon are seen. Small bowel and stomach appear within normal limits. Vascular/Lymphatic: Aortic atherosclerosis. No enlarged abdominal or pelvic lymph nodes. Reproductive: Uterus and bilateral adnexa are unremarkable. Other: No abdominal wall hernia or abnormality. No abdominopelvic ascites. Musculoskeletal: Postsurgical changes are noted in the lumbar spine stable from the prior exam. No acute bony abnormality is noted. In the left anterior and lateral abdominal wall there is again noted a large hematoma with fluid fluid level within. This measures up to 13 cm in greatest transverse diameter and extends for approximately 15.4 cm in craniocaudad projection. Previously seen hyperdense areas within are no longer identified. No findings to suggest active hemorrhage are seen. The collection in the left lateral thigh is incompletely evaluated on this exam but appears stable. IMPRESSION: Persistent abdominal and left lateral thigh hematomas. No findings to suggest active hemorrhage are identified. Cholelithiasis without complicating factors. No acute abnormality noted. Electronically Signed   By: Oneil Devonshire M.D.   On: 06/28/2024 19:33   DG CHEST PORT 1 VIEW Result Date: 06/28/2024 CLINICAL DATA:  Status post central line placement EXAM: PORTABLE CHEST 1 VIEW COMPARISON:  06/25/2024 FINDINGS: Cardiac shadow is stable.  Defibrillator is again noted and stable. New right  jugular central line is noted with the tip in the superior right atrium. No pneumothorax is seen. The overall inspiratory effort is poor. Persistent scarring is noted in the left mid lung. No free air is noted. IMPRESSION: No pneumothorax following central line placement Electronically Signed   By: Oneil Devonshire M.D.   On: 06/28/2024 19:27   US  EKG SITE RITE Result Date: 06/28/2024 If Site Rite image not attached, placement could not be confirmed due to current cardiac rhythm.   Anti-infectives: Anti-infectives (From admission, onward)    Start     Dose/Rate Route Frequency Ordered Stop   06/28/24 2200  cefTRIAXone (ROCEPHIN) 2 g in sodium chloride  0.9 % 100 mL IVPB        2 g 200 mL/hr over 30 Minutes Intravenous Every 24 hours 06/28/24 2032     06/28/24 2200  metroNIDAZOLE (FLAGYL) IVPB 500 mg        500 mg 100 mL/hr over 60 Minutes Intravenous Every 12 hours 06/28/24 2032         Assessment/Plan: Patient with hematoma on the left lower abdomen and thigh. I do not think this is infected based on the CT and procalcitonin. Discussed with Dr. Vicci option of draining if we need to due to her levophed needs to rule out any infection. Will continue to hold for now. NPO at midnight and will reassess tomorrow AM.    LOS: 4 days    [name] [name] 06/30/2024

## 2024-06-30 NOTE — Progress Notes (Addendum)
 PROGRESS NOTE   [name] [name]  FMW:[id number] DOB: Jan 09, 1961 DOA: 06/25/2024 PCP: Lavell Bari LABOR, FNP   Chief Complaint  Patient presents with   Fall   Level of care: ICU  Brief Admission History:  63 y.o. female with medical history significant of antiphospholipid syndrome on warfarin, CHF, A-fib who presents to the emergency department after sustaining a fall and had an abdominal wall injury.  Patient states that she sustained a fall yesterday in the afternoon around 2 PM when her walker hit something resulting in her losing balance and fell landing on walker and striking left side of her abdomen with subsequent bruise to the area.  She denies hitting head or losing consciousness.  She decided to go to the ED for evaluation due to being on warfarin and being concerned of internal bleeding.  Patient also complained of nausea and vomiting since she recently started Wegovy .  She had difficulty in being able to keep any food down.   ED Course:  In the emergency department, BP was in hypotensive range at 67/57 (patient endorsed chronic soft BP with SBP's in 80s to 90s).  Other vital signs were within normal range.  Workup in the ED showed normal CBC except for WBC of 11.9.  BMP showed sodium 134, potassium 4.4, chloride 95, bicarb 23, blood glucose 126, BUN 18, creatinine 2.05, albumin  3.3, AST 23, ALT 47, GFR 27, anion gap 16, INR 6.3 CT chest, abdomen and pelvis with contrast showed Soft tissue contusion and large hematoma in the anterolateral left abdominal wall measuring 9.4 x 6.7 x 17.5 cm and 2.7 x 2.3 cm fluid collection in the lateral left thigh with mild peripheral enhancement.  No evidence of acute traumatic injury within the chest.  Trauma surgeon on-call (Dr. Ann) was consulted and recommended that patient can stay AP since there is no indication for any surgical intervention.  Abdominal binding, warfarin reversal and monitoring of H/H was recommended per EDP.  IV hydration was  provided, Percocet was given, vitamin K was given.   Assessment and Plan:  Persistent Hypotension--suspect this is hypovolemic due to acute blood loss (hemorrhagic shock) in the setting of abdominal wall hematoma due to fall at home PTA in a patient with supratherapeutic INR --Currently requiring IV Levophed for pressure support --Holding PTA Coreg , Aldactone  and isosorbide  due to hypotension --Continue midodrine 10 mg 3 times daily for pressure support --Rule out infection FROM 06/28/24----CT chest abdomen and pelvis without infectious finding - surgery planning to explore on 7/3.  -Get blood cultures--NGTD - Check UA-Not suggestive of UTI -Check PCT- 0.16 --check a.m. cortisol--11.4 -Concern for possible superimposed intra-abdominal infection on patient's hematoma -WBC 12.8 >>14.7 >>16.8>14.6  PCT 0.16 - empirically started on IV CTX and IV Flagyl after obtaining blood cultures to cover for possible infected intra-abdominal hematoma   Abdominal wall Hematoma--- noticed post fall at home in the setting of supratherapeutic INR -Okay to use abdominal binder if that helps her discomfort from bruising and hematoma -Repeat CT AP with contrast done on 06/28/2024 due to persistent hypotension requiring high-dose Levophed despite IV fluids and due to Hgb drifting down-- -CTA AP with oral and IV contrast on 06/28/2024 shows--Persistent abdominal and left lateral thigh hematomas. No findings to suggest active hemorrhage are identified. -- General Surgery consult appreciated--planning to explore hematoma on 7/3  - Hgb improved to 10.6 after transfusion of 1 unit of PRBC   - I communicated with her cardiologist Dr. Rolan who agreed with exploring the hematoma  to try to get her off pressors   Acute blood loss anemia due to above--with hemorrhagic shock Hgb 14.3 >>13.5 >>10.6  --Suspect slight component of hemodilution due to recent IV fluids - Monitor H&H and transfuse as clinically indicated -  recheck in AM    Supratherapeutic INR--in the setting of warfarin therapy for Antiphospholipid syndrome INR 6.3 >>3.4>> 1.4 >>1.3 -INR normalized after vitamin K x 2 doses --reports poor oral intake over the last few weeks due to Wegovy  therapy--poor oral  intake probably exacerbated her supratherapeutic INR associated with lack of vitamin K intake   Fall at home Continue fall precaution Continue PT/OT eval and treat   GERD Continue Protonix    Chronic systolic CHF -Repeat echo on 06/27/2024 with EF up to 35 to 40% from 30-35 % back in June 2025 Continue to hold Coreg , Entresto , isosorbide  spironolactone  due to soft BP --Currently appears intravascularly volume depleted due to acute blood loss with hemorrhagic shock   Mixed hyperlipidemia Continue Zetia  Statin will be temporarily held due to mild transaminitis She also takes Repatha  every 14 days   Mild Acute HypoNatremia--- multifactorial -A.m. cortisol is 11.4 (WNL) -TSH WNL --Be judicious with IV fluids as patient's EF is around 35%  DVT prophylaxis: SCDs Code Status: Full  Family Communication: t/c to nephew  Disposition: TBD   Consultants:  Surgery  Procedures:   Antimicrobials:    Subjective: Pt reports she feels unwell today but not able to further clarify.   Objective: Vitals:   06/30/24 1430 06/30/24 1445 06/30/24 1500 06/30/24 1515  BP: (!) 117/54 123/71 (!) 106/49 (!) 77/52  Pulse: 87 86 90 86  Resp: 16 17 13 19   Temp:      TempSrc:      SpO2: 97% 97% 96% 94%  Weight:      Height:        Intake/Output Summary (Last 24 hours) at 06/30/2024 1628 Last data filed at 06/30/2024 1506 Gross per 24 hour  Intake 2316.3 ml  Output 1350 ml  Net 966.3 ml   Filed Weights   06/25/24 2152 06/27/24 0423 06/30/24 0306  Weight: 88.5 kg 92.4 kg 94.9 kg   Examination:  General exam: Appears calm and comfortable  Respiratory system: Clear to auscultation. Respiratory effort normal. Cardiovascular system: normal  S1 & S2 heard. No JVD, murmurs, rubs, gallops or clicks. No pedal edema. Gastrointestinal system: Abdomen is nondistended, soft and nontender. No organomegaly or masses felt. Normal bowel sounds heard. Central nervous system: Alert and oriented. No focal neurological deficits. Extremities: Symmetric 5 x 5 power. Skin: large abdominal and thigh hematoma noted. Psychiatry: Judgement and insight appear normal. Mood & affect appropriate.   Data Reviewed: I have personally reviewed following labs and imaging studies  CBC: Recent Labs  Lab 06/25/24 2239 06/25/24 2245 06/27/24 0404 06/27/24 1720 06/28/24 0518 06/28/24 1904 06/29/24 0418 06/30/24 0411  WBC 11.9*  --  12.8*  --  14.7*  --  16.8* 14.6*  NEUTROABS 9.6*  --   --   --   --   --   --  11.9*  HGB 14.3   < > 9.8* 10.2* 9.4* 8.8* 9.8* 10.6*  HCT 42.5   < > 30.4* 30.7* 28.3* 26.1* 28.7* 31.9*  MCV 94.7  --  95.9  --  95.9  --  94.1 94.4  PLT 187  --  136*  --  134*  --  145* 212   < > = values in this interval not displayed.  Basic Metabolic Panel: Recent Labs  Lab 06/25/24 2239 06/25/24 2245 06/27/24 0404 06/28/24 0518 06/29/24 0418 06/30/24 0411  NA 134* 132* 127* 133* 129* 131*  K 4.4 5.2* 3.9 4.4 3.8 3.6  CL 95* 97* 92* 100 97* 98  CO2 23  --  23 24 25 26   GLUCOSE 126* 126* 113* 108* 110* 120*  BUN 18 23 18 13 12 9   CREATININE 2.05* 2.10* 1.64* 1.25* 0.98 0.82  CALCIUM  8.8*  --  8.2* 8.2* 7.9* 8.1*  PHOS  --   --  4.1  --   --   --     CBG: Recent Labs  Lab 06/28/24 1122  GLUCAP 111*    Recent Results (from the past 240 hours)  MRSA Next Gen by PCR, Nasal     Status: None   Collection Time: 06/26/24  2:46 AM   Specimen: Nasal Mucosa; Nasal Swab  Result Value Ref Range Status   MRSA by PCR Next Gen NOT DETECTED NOT DETECTED Final    Comment: (NOTE) The GeneXpert MRSA Assay (FDA approved for NASAL specimens only), is one component of a comprehensive MRSA colonization surveillance program. It is not  intended to diagnose MRSA infection nor to guide or monitor treatment for MRSA infections. Test performance is not FDA approved in patients less than 56 years old. Performed at [facility], [address] [place], [place] [address]   Culture, blood (Routine X 2) w Reflex to ID Panel     Status: None (Preliminary result)   Collection Time: 06/28/24  7:04 PM   Specimen: BLOOD  Result Value Ref Range Status   Specimen Description BLOOD BLOOD RIGHT HAND  Final   Special Requests   Final    BOTTLES DRAWN AEROBIC ONLY Blood Culture results may not be optimal due to an inadequate volume of blood received in culture bottles   Culture   Final    NO GROWTH 2 DAYS Performed at [facility], [address], [place], [place] [address]    Report Status PENDING  Incomplete  Culture, blood (Routine X 2) w Reflex to ID Panel     Status: None (Preliminary result)   Collection Time: 06/28/24  8:05 PM   Specimen: BLOOD  Result Value Ref Range Status   Specimen Description BLOOD BLOOD RIGHT HAND  Final   Special Requests   Final    BOTTLES DRAWN AEROBIC ONLY Blood Culture results may not be optimal due to an inadequate volume of blood received in culture bottles   Culture   Final    NO GROWTH 2 DAYS Performed at [facility], [address] [place], [place] [address]    Report Status PENDING  Incomplete     Radiology Studies: DG CHEST PORT 1 VIEW Result Date: 06/28/2024 CLINICAL DATA:  Status post central line placement EXAM: PORTABLE CHEST 1 VIEW COMPARISON:  06/25/2024 FINDINGS: Cardiac shadow is stable. Defibrillator is again noted and stable. New right jugular central line is noted with the tip in the superior right atrium. No pneumothorax is seen. The overall inspiratory effort is poor. Persistent scarring is noted in the left mid lung. No free air is noted. IMPRESSION: No pneumothorax following central line placement Electronically Signed   By: Oneil Devonshire M.D.   On: 06/28/2024 19:27   US  EKG  SITE RITE Result Date: 06/28/2024 If Site Rite image not attached, placement could not be confirmed due to current cardiac rhythm.   Scheduled Meds:  amiodarone   100 mg  Oral Once per day on Monday Tuesday Wednesday Thursday   amiodarone   200 mg Oral Once per day on Monday Tuesday Wednesday Thursday   amiodarone   200 mg Oral 2 times per day on Sunday Friday Saturday   Chlorhexidine  Gluconate Cloth  6 each Topical Q0600   DULoxetine   60 mg Oral Daily   ezetimibe   10 mg Oral Daily   feeding supplement  237 mL Oral BID BM   mexiletine  300 mg Oral TID   midodrine  10 mg Oral TID WC   pantoprazole   40 mg Oral Daily   polyethylene glycol  17 g Oral BID   Vericiguat   1 tablet Oral Daily   Continuous Infusions:  cefTRIAXone (ROCEPHIN)  IV 5 mL/hr at 06/30/24 1506   metronidazole Stopped (06/30/24 1053)   norepinephrine (LEVOPHED) Adult infusion 15 mcg/min (06/30/24 1506)     LOS: 4 days   Critical Care Procedure Note Authorized and Performed by: KYM Louder MD  Total Critical Care time:  63 mins  Due to a high probability of clinically significant, life threatening deterioration, the patient required my highest level of preparedness to intervene emergently and I personally spent this critical care time directly and personally managing the patient.  This critical care time included obtaining a history; examining the patient, pulse oximetry; ordering and review of studies; arranging urgent treatment with development of a management plan; evaluation of patient's response of treatment; frequent reassessment; and discussions with other providers.  This critical care time was performed to assess and manage the high probability of imminent and life threatening deterioration that could result in multi-organ failure.  It was exclusive of separately billable procedures and treating other patients and teaching time.   Afton Louder, MD How to contact the [facility] Attending or Consulting provider 7A - 7P or  covering provider during after hours 7P -7A, for this patient?  Check the care team in [facility] and look for a) attending/consulting [facility] provider listed and b) the [facility] team listed Log into [email or web address] to find provider on call.  Locate the [facility] provider you are looking for under [facility] and page to a number that you can be directly reached. If you still have difficulty reaching the provider, please page the [facility] (Director on Call) for the Hospitalists listed on amion for assistance.  06/30/2024, 4:28 PM

## 2024-06-30 NOTE — Hospital Course (Addendum)
 63 y.o. 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  Workup noted abdominal wall hematoma,. 7/3 : underwent Incision and drainage of hematoma, JP drain placement-Dr.Bridges hospital course notable for persistent hypotension, multifactorial> admission to ICU.   7/5 patient remained hypotensive and pressor dependent prompting decision to move to [facility] for further ongoing workup and management  7/5 remains pressor dependent, warfarin restarted 7/7 weaned off pressors, Foley catheter removed 7/8 transferred to [facility] service, urinary retention required I/O cath 7/10, started on IV diuresis. 7/11 chest x-ray shows vascular congestion and x-ray abdomen shows constipation.  Renal function mildly worsening. 7/12 CT abdomen performed.  Shows constipation no evidence of obstruction.  Hematoma improving.  CRP elevated.  Started on cefadroxil  and doxycycline . 07/31/24 had an episode of hallucination.  Labs in the morning showed LFTs more than 1000.  Blood pressure systolic dropping below 90.  Transferred to ICU for pressors and concern for shock.  Assessment and Plan: Abdominal wall hematoma status post fall Acute blood loss anemia Underwent exploration with surgery to rule out abscess 7/3, cultures negative  She does have large bruise on left side of abdomen, left hip region and left thigh JP drain in place, minimal dark bloody output -per Dr.Bridges notes from [facility], can remove JP if output<50/day, would ideally like to wait for drain removal once her JP output is less than 50 mL/day for 2 days in a row given fluctuation noted.  Given elevated INR continue drain. Transfused 1 unit of PRBC this admission Hemoglobin appears stable, anticoagulation was restarted CT abdomen on 31-Jul-2024 shows evidence of improvement in hematoma.   Anti  phospholipid antibody syndrome Supratherapeutic INR With history of CVA, on warfarin at baseline, supratherapeutic INR on admission and hematoma, acute blood loss anemia Warfarin resumed 7/5.  INR supratherapeutic 7/11.  Last Coumadin  on 7/11. Given of IV vitamin K  this morning.   Persistent hypotension Concern for cardiogenic shock. Multifactorial from acute blood loss anemia, CHF, cardiac meds. Requiring high dose of midodrine .  Was able to taper off to 10 mg 3 times daily on 7/10.  Blood pressure dropped again on 31-Jul-2024.  Renal function worsened as well as LFTs worsen.  Concern for cardiogenic shock versus septic shock.  Transferred to ICU for pressors and aggressive diuresis.  Appreciate PCCM and advanced heart failure service team assistance.SABRA   CAD with prior MI Acute on chronic combined HFrEF and HFpEF status post AICD Echocardiogram 06/27/2024 with EF of 35 to 40% and grade 1 diastolic dysfunction Followed by advanced heart failure team Home regimen includes Coreg , Imdur , Entresto , Jardiance .  And torsemide  Coreg  has been resumed for now and patient is tolerating it well. Treated with IV Lasix .  Due to mild worsening of the renal function and poor p.o. intake as well as concern for ileus I will currently hold Lasix .  Likely resume tomorrow.  Paroxysmal A-fib On amiodarone , warfarin.  Both on hold. Currently in sinus rhythm   Recurrent falls  Wonder if hypotension is responsible for this.  PT OT consult    Hyponatremia, hypervolemic NA improved   Left distal radius fracture Patient has hard cast PCCM service as well as myself both have tried to reach out to Dr. Alyse service. Ortho tech cut a piece of the cast.  With improvement in pain.   Urinary retention Required In-N-Out catheterization.  Resolved.  Chest tightness.  Due to volume overload. Elevated troponin.  Elevated D-dimer. Patient reports some chest tightness on 7/10. Chest x-ray shows evidence of volume  overload. D-dimer is mildly elevated but she remains therapeutic with INR therefore less likely PE. EKG unremarkable.  Troponins are elevated again but not in ACS territory. For now pain control with Percocet.  Obesity Body mass index is 34.41 kg/m.  Placing the patient at high risk of poor outcome.  Moderate protein-calorie malnutrition  Continue supplementation and Interventions: Refer to RD note for recommendations  Hyperkalemia. AKI. Renal function was 0.69 on 7/9.  Renal function is 1.26 on 7/11. Currently receiving IV diuresis.  Potassium level 5.2. Will recheck tomorrow.  Leukocytosis. Likely stress reaction. For now we will monitor. Procalcitonin is elevated therefore lower threshold to initiate antibiotic if indicated.  Intractable nausea and vomiting.  Concern for ileus although most likely severe constipation. X-ray abdomen shows concern for ileus. CT abdomen ordered.  no evidence of bowel obstruction.  Ascending colon stool burden seen.  Improvement in hematoma. Continue bowel regimen.  LFT elevation.  Likely shock liver. Appreciate GI consultation. Ultrasound liver unremarkable other than cholelithiasis. Tylenol  level negative. Avoid hepatotoxic of medication.  Poor p.o. intake. In the setting of ongoing protein calorie malnutrition and prolonged poor p.o. intake may require nutritional assistance. Discussed with family, initial goal would be to help with bowel movement to ensure enteral feeding is possible and which would be preferred option.  Hallucination. Etiology not clear. Mentation is much clearer in the morning but Will continue to monitor.  Anisocoria. Right eye with mydriasis.  Left eye normal. Both are equally reactive to light. No other focal deficit on my examination. Ordered CT head which appears to be unremarkable.

## 2024-07-01 ENCOUNTER — Encounter (HOSPITAL_COMMUNITY): Payer: Self-pay | Admitting: Internal Medicine

## 2024-07-01 ENCOUNTER — Other Ambulatory Visit: Payer: Self-pay

## 2024-07-01 ENCOUNTER — Encounter (HOSPITAL_COMMUNITY): Admission: EM | Disposition: E | Payer: Self-pay | Source: Home / Self Care | Attending: Internal Medicine

## 2024-07-01 ENCOUNTER — Inpatient Hospital Stay (HOSPITAL_COMMUNITY): Admitting: Certified Registered Nurse Anesthetist

## 2024-07-01 DIAGNOSIS — D689 Coagulation defect, unspecified: Secondary | ICD-10-CM

## 2024-07-01 DIAGNOSIS — I25119 Atherosclerotic heart disease of native coronary artery with unspecified angina pectoris: Secondary | ICD-10-CM

## 2024-07-01 DIAGNOSIS — S301XXA Contusion of abdominal wall, initial encounter: Secondary | ICD-10-CM

## 2024-07-01 DIAGNOSIS — S301XXD Contusion of abdominal wall, subsequent encounter: Secondary | ICD-10-CM | POA: Diagnosis not present

## 2024-07-01 DIAGNOSIS — I110 Hypertensive heart disease with heart failure: Secondary | ICD-10-CM

## 2024-07-01 DIAGNOSIS — I5042 Chronic combined systolic (congestive) and diastolic (congestive) heart failure: Secondary | ICD-10-CM

## 2024-07-01 DIAGNOSIS — I959 Hypotension, unspecified: Secondary | ICD-10-CM | POA: Diagnosis not present

## 2024-07-01 HISTORY — PX: INCISION AND DRAINAGE ABSCESS: SHX5864

## 2024-07-01 LAB — CBC
HCT: 30.7 % — ABNORMAL LOW (ref 36.0–46.0)
Hemoglobin: 10.3 g/dL — ABNORMAL LOW (ref 12.0–15.0)
MCH: 32.4 pg (ref 26.0–34.0)
MCHC: 33.6 g/dL (ref 30.0–36.0)
MCV: 96.5 fL (ref 80.0–100.0)
Platelets: 208 10*3/uL (ref 150–400)
RBC: 3.18 MIL/uL — ABNORMAL LOW (ref 3.87–5.11)
RDW: 14.5 % (ref 11.5–15.5)
WBC: 10.7 10*3/uL — ABNORMAL HIGH (ref 4.0–10.5)
nRBC: 0.2 % (ref 0.0–0.2)

## 2024-07-01 LAB — BASIC METABOLIC PANEL WITH GFR
Anion gap: 8 (ref 5–15)
BUN: 9 mg/dL (ref 8–23)
CO2: 26 mmol/L (ref 22–32)
Calcium: 7.8 mg/dL — ABNORMAL LOW (ref 8.9–10.3)
Chloride: 96 mmol/L — ABNORMAL LOW (ref 98–111)
Creatinine, Ser: 0.81 mg/dL (ref 0.44–1.00)
GFR, Estimated: >60 mL/min (ref >60)
Glucose, Bld: 114 mg/dL — ABNORMAL HIGH (ref 70–99)
Potassium: 3.6 mmol/L (ref 3.5–5.1)
Sodium: 130 mmol/L — ABNORMAL LOW (ref 135–145)

## 2024-07-01 SURGERY — INCISION AND DRAINAGE, ABSCESS
Anesthesia: Monitor Anesthesia Care | Site: Abdomen | Laterality: Left

## 2024-07-01 SURGERY — IRRIGATION AND DEBRIDEMENT HEMATOMA
Anesthesia: General

## 2024-07-01 MED ORDER — SODIUM CHLORIDE 0.9 % IR SOLN
Status: DC | PRN
  Administered 2024-07-01: 1000 mL

## 2024-07-01 MED ORDER — LACTATED RINGERS IV SOLN: INTRAVENOUS | Status: DC | PRN

## 2024-07-01 MED ORDER — MIDAZOLAM HCL 2 MG/2ML IJ SOLN
INTRAMUSCULAR | Status: AC
  Filled 2024-07-01: qty 2

## 2024-07-01 MED ORDER — LIDOCAINE HCL (PF) 1 % IJ SOLN
INTRAMUSCULAR | Status: DC | PRN
  Administered 2024-07-01: 10 mL

## 2024-07-01 MED ORDER — FENTANYL CITRATE (PF) 100 MCG/2ML IJ SOLN
INTRAMUSCULAR | Status: DC | PRN
  Administered 2024-07-01 (×2): 25 ug via INTRAVENOUS

## 2024-07-01 MED ORDER — CHLORHEXIDINE GLUCONATE CLOTH 2 % EX PADS: 6.0000 | MEDICATED_PAD | Freq: Once | CUTANEOUS | Status: DC

## 2024-07-01 MED ORDER — LACTATED RINGERS IV BOLUS: 500.0000 mL | Freq: Once | INTRAVENOUS | Status: DC

## 2024-07-01 MED ORDER — MIDAZOLAM HCL 2 MG/2ML IJ SOLN
INTRAMUSCULAR | Status: DC | PRN
Start: 2024-07-01 — End: 2024-07-01
  Administered 2024-07-01: 2 mg via INTRAVENOUS

## 2024-07-01 MED ORDER — FENTANYL CITRATE (PF) 100 MCG/2ML IJ SOLN
INTRAMUSCULAR | Status: AC
  Filled 2024-07-01: qty 2

## 2024-07-01 MED ORDER — LIDOCAINE HCL (PF) 1 % IJ SOLN
INTRAMUSCULAR | Status: AC
  Filled 2024-07-01: qty 30

## 2024-07-01 MED ORDER — CHLORHEXIDINE GLUCONATE 0.12 % MT SOLN
OROMUCOSAL | Status: AC
  Administered 2024-07-01: 15 mL
  Filled 2024-07-01: qty 15

## 2024-07-01 MED ORDER — LACTATED RINGERS IV BOLUS
500.0000 mL | Freq: Once | INTRAVENOUS | Status: AC
  Administered 2024-07-01: 500 mL via INTRAVENOUS

## 2024-07-01 MED ORDER — BUPIVACAINE HCL (PF) 0.5 % IJ SOLN
INTRAMUSCULAR | Status: AC
  Filled 2024-07-01: qty 30

## 2024-07-01 SURGICAL SUPPLY — 23 items
BNDG CONFORM 2 STRL LF (GAUZE/BANDAGES/DRESSINGS) ×1 IMPLANT
CLOTH BEACON ORANGE TIMEOUT ST (SAFETY) ×1 IMPLANT
COVER LIGHT HANDLE (MISCELLANEOUS) IMPLANT
ELECTRODE REM PT RTRN 9FT ADLT (ELECTROSURGICAL) ×1 IMPLANT
EVACUATOR DRAINAGE 10X20 100CC (DRAIN) IMPLANT
GAUZE SPONGE 4X4 12PLY STRL (GAUZE/BANDAGES/DRESSINGS) ×2 IMPLANT
GLOVE BIO SURGEON STRL SZ 6.5 (GLOVE) ×1 IMPLANT
GLOVE BIOGEL PI IND STRL 6.5 (GLOVE) ×1 IMPLANT
GLOVE BIOGEL PI IND STRL 7.0 (GLOVE) ×2 IMPLANT
GOWN STRL REUS W/TWL LRG LVL3 (GOWN DISPOSABLE) ×2 IMPLANT
KIT TURNOVER KIT A (KITS) ×1 IMPLANT
MANIFOLD NEPTUNE II (INSTRUMENTS) ×1 IMPLANT
NS IRRIG 1000ML POUR BTL (IV SOLUTION) ×1 IMPLANT
PACK MINOR (CUSTOM PROCEDURE TRAY) ×1 IMPLANT
PAD ABD 5X9 TENDERSORB (GAUZE/BANDAGES/DRESSINGS) IMPLANT
PAD ARMBOARD POSITIONER FOAM (MISCELLANEOUS) ×1 IMPLANT
POSITIONER HEAD 8X9X4 ADT (SOFTGOODS) ×1 IMPLANT
SET BASIN LINEN APH (SET/KITS/TRAYS/PACK) ×1 IMPLANT
SOL PREP PROV IODINE SCRUB 4OZ (MISCELLANEOUS) ×1 IMPLANT
SPONGE DRAIN TRACH 4X4 STRL 2S (GAUZE/BANDAGES/DRESSINGS) IMPLANT
SWAB CULTURE ESWAB REG 1ML (MISCELLANEOUS) IMPLANT
SYR BULB IRRIG 60ML STRL (SYRINGE) ×1 IMPLANT
TAPE CLOTH SURG 4X10 WHT LF (GAUZE/BANDAGES/DRESSINGS) IMPLANT

## 2024-07-01 NOTE — Progress Notes (Addendum)
 [facility]  Ione but went to VM and not able to leave a VM. Updated the patient and team.   Hematoma evacuated, culture obtained. JP drain in place. Old dark blood being evacuated.  JP drain orders in place.   [name] Pander, MD [facility] [address] [place], [place] [address] [phone] (office)

## 2024-07-01 NOTE — Transfer of Care (Signed)
 Immediate Anesthesia Transfer of Care Note  Patient: [name] [name]  Procedure(s) Performed: INCISION AND DRAINAGE HEMATOMA (Left: Abdomen)  Patient Location: PACU  Anesthesia Type:MAC  Level of Consciousness: awake, alert , and oriented  Airway & Oxygen Therapy: Patient Spontanous Breathing and Patient connected to nasal cannula oxygen  Post-op Assessment: Report given to RN and Post -op Vital signs reviewed and stable  Post vital signs: Reviewed and stable  Last Vitals:  Vitals Value Taken Time  BP 97/49   Temp 97.7   Pulse 85   Resp 12 07/01/24 12:06  SpO2 97%   Vitals shown include unfiled device data.  Last Pain:  Vitals:   07/01/24 0839  TempSrc: Oral  PainSc: 0-No pain      Patients Stated Pain Goal: 0 (07/01/24 0500)  Complications: No notable events documented.

## 2024-07-01 NOTE — Plan of Care (Signed)
  Problem: Clinical Measurements: Goal: Ability to maintain clinical measurements within normal limits will improve Outcome: Progressing Goal: Will remain free from infection Outcome: Progressing Goal: Diagnostic test results will improve Outcome: Progressing Goal: Respiratory complications will improve Outcome: Progressing Goal: Cardiovascular complication will be avoided Outcome: Progressing   Problem: Health Behavior/Discharge Planning: Goal: Ability to manage health-related needs will improve Outcome: Progressing   Problem: Education: Goal: Knowledge of General Education information will improve Description: Including pain rating scale, medication(s)/side effects and non-pharmacologic comfort measures Outcome: Progressing   Problem: Activity: Goal: Risk for activity intolerance will decrease Outcome: Progressing   Problem: Nutrition: Goal: Adequate nutrition will be maintained Outcome: Progressing   Problem: Coping: Goal: Level of anxiety will decrease Outcome: Progressing   Problem: Elimination: Goal: Will not experience complications related to bowel motility Outcome: Progressing Goal: Will not experience complications related to urinary retention Outcome: Progressing   Problem: Pain Managment: Goal: General experience of comfort will improve and/or be controlled Outcome: Progressing   Problem: Safety: Goal: Ability to remain free from injury will improve Outcome: Progressing   Problem: Skin Integrity: Goal: Risk for impaired skin integrity will decrease Outcome: Progressing

## 2024-07-01 NOTE — Progress Notes (Signed)
 [facility]  Manus called me back. Updated him.   [name] Pander, MD [facility] [address] [place], [place] [address] [phone] (office)

## 2024-07-01 NOTE — Op Note (Signed)
 [facility] Operative Note  07/01/24  Preoperative Diagnosis: Left lower abdominal wall hematoma versus abscess    Postoperative Diagnosis: Same   Procedure(s) Performed: Incision and drainage of hematoma, JP drain placement    Surgeon: [name] BROCKS. Kallie, MD   Assistants: No qualified resident was available    Anesthesia: Sedation    Anesthesiologist: Dr. Herschell    Specimens: Culture of hematoma    Estimated Blood Loss: Minimal   Blood Replacement: None    Complications: None   Wound Class: Clean   Operative Indications: [name] [name] is a 63 yo who has a hematoma on the left lower abdominal wall and continued need for levophed. We discussed that we needed to evacuate this to prove this was not infected and driving this need. Discussed risk of bleeding, JP drain, dressing changes.   Findings: Liquidified hematoma looking fluid    Procedure: The patient was taken to the operating room and placed supine. Sedation was induced. Intravenous antibiotics were not administered per protocol as we planned for cultures. The left lower abdomen and upper thigh were prepped and draped in the usual sterile fashion.   I palpated the area and corresponded to her CT scan from 06/28/24. I injected lidocaine  in the area and made a stab incision over the fluctuant area.  Hematoma was noted. Culture was obtained. Irrigation was performed. A JP drain was placed into the area, and secured with 3-0 Nylon and the incision closed with 3-0 Nylon interrupted. A dressing was placed.   Final inspection revealed acceptable hemostasis. All counts were correct at the end of the case. The patient was awakened from sedation without complication.  The patient went to the PACU in stable condition.   [name] Kallie, MD [facility] [address] [place], [place] [address] [phone] (office)

## 2024-07-01 NOTE — Progress Notes (Signed)
 PROGRESS NOTE   [name] [name]  FMW:[id number] DOB: Sep 29, 1961 DOA: 06/25/2024 PCP: Lavell Bari LABOR, FNP   Chief Complaint  Patient presents with   Fall   Level of care: ICU  Brief Admission History:  63 y.o. female with medical history significant of antiphospholipid syndrome on warfarin, CHF, A-fib who presents to the emergency department after sustaining a fall and had an abdominal wall injury.  Patient states that she sustained a fall yesterday in the afternoon around 2 PM when her walker hit something resulting in her losing balance and fell landing on walker and striking left side of her abdomen with subsequent bruise to the area.  She denies hitting head or losing consciousness.  She decided to go to the ED for evaluation due to being on warfarin and being concerned of internal bleeding.  Patient also complained of nausea and vomiting since she recently started Wegovy .  She had difficulty in being able to keep any food down.   ED Course:  In the emergency department, BP was in hypotensive range at 67/57 (patient endorsed chronic soft BP with SBP's in 80s to 90s).  Other vital signs were within normal range.  Workup in the ED showed normal CBC except for WBC of 11.9.  BMP showed sodium 134, potassium 4.4, chloride 95, bicarb 23, blood glucose 126, BUN 18, creatinine 2.05, albumin  3.3, AST 23, ALT 47, GFR 27, anion gap 16, INR 6.3 CT chest, abdomen and pelvis with contrast showed Soft tissue contusion and large hematoma in the anterolateral left abdominal wall measuring 9.4 x 6.7 x 17.5 cm and 2.7 x 2.3 cm fluid collection in the lateral left thigh with mild peripheral enhancement.  No evidence of acute traumatic injury within the chest.  Trauma surgeon on-call (Dr. Ann) was consulted and recommended that patient can stay AP since there is no indication for any surgical intervention.  Abdominal binding, warfarin reversal and monitoring of H/H was recommended per EDP.  IV hydration was  provided, Percocet was given, vitamin K was given.   Assessment and Plan:  Persistent Hypotension--suspect this is hypovolemic due to acute blood loss (hemorrhagic shock) in the setting of abdominal wall hematoma due to fall at home PTA in a patient with supratherapeutic INR --Currently requiring IV Levophed for pressure support --Holding PTA Coreg , Aldactone  and isosorbide  due to hypotension --Continue midodrine 10 mg 3 times daily for pressure support --Rule out infection FROM 06/28/24----CT chest abdomen and pelvis without infectious finding - surgery planning to explore on 7/3.  -Get blood cultures--NGTD - Check UA-Not suggestive of UTI -Check PCT- 0.16 --check a.m. cortisol--11.4 -OR 7/3 to test and rule out superimposed intra-abdominal infection on patient's hematoma -WBC 12.8 >>14.7 >>16.8>14.6  PCT 0.16 - empirically started on IV CTX and IV Flagyl after obtaining blood cultures to cover for possible infected intra-abdominal hematoma   Abdominal wall Hematoma--- noticed post fall at home in the setting of supratherapeutic INR -Okay to use abdominal binder if that helps her discomfort from bruising and hematoma -Repeat CT AP with contrast done on 06/28/2024 due to persistent hypotension requiring high-dose Levophed despite IV fluids and due to Hgb drifting down-- -CTA AP with oral and IV contrast on 06/28/2024 shows--Persistent abdominal and left lateral thigh hematomas. No findings to suggest active hemorrhage are identified. -- General Surgery consult appreciated--planning to explore hematoma on 7/3  - Hgb improved to 10.6 after transfusion of 1 unit of PRBC   - I communicated with her cardiologist Dr. Rolan who agreed  with exploring the hematoma to try to get her off pressors   Acute blood loss anemia due to above--with hemorrhagic shock Hgb 14.3 >>13.5 >>10.6  --Suspect slight component of hemodilution due to recent IV fluids - Monitor H&H and transfuse as clinically  indicated - recheck in AM    Supratherapeutic INR--in the setting of warfarin therapy for Antiphospholipid syndrome INR 6.3 >>3.4>> 1.4 >>1.3 -INR normalized after vitamin K x 2 doses --reports poor oral intake over the last few weeks due to Wegovy  therapy--poor oral  intake probably exacerbated her supratherapeutic INR associated with lack of vitamin K intake   Fall at home Continue fall precaution Continue PT/OT eval and treat   GERD Continue Protonix    Chronic systolic CHF -Repeat echo on 06/27/2024 with EF up to 35 to 40% from 30-35 % back in June 2025 Continue to hold Coreg , Entresto , isosorbide  spironolactone  due to soft BP --Currently appears intravascularly volume depleted due to acute blood loss with hemorrhagic shock   Mixed hyperlipidemia Continue Zetia  Statin will be temporarily held due to mild transaminitis She also takes Repatha  every 14 days   Mild Acute HypoNatremia--- multifactorial -A.m. cortisol is 11.4 (WNL) -TSH WNL -recheck in AM   DVT prophylaxis: SCDs Code Status: Full  Family Communication: t/c to nephew on 7/2 Disposition: TBD   Consultants:  Surgery  Procedures:   Antimicrobials:    Subjective: Pt seen prior to OR time reports tenderness in left abdominal wall;   Objective: Vitals:   07/01/24 1230 07/01/24 1300 07/01/24 1315 07/01/24 1334  BP: 97/61 (!) 109/51 (!) 106/56 (!) 99/51  Pulse: 87 89 81   Resp: 12 18 16 15   Temp:      TempSrc:      SpO2: 90% 93% 95%   Weight:      Height:        Intake/Output Summary (Last 24 hours) at 07/01/2024 1548 Last data filed at 07/01/2024 1516 Gross per 24 hour  Intake 1225.35 ml  Output 2550 ml  Net -1324.65 ml   Filed Weights   06/30/24 0306 07/01/24 0500 07/01/24 0839  Weight: 94.9 kg 94.5 kg 87.1 kg   Examination:  General exam: Appears calm and comfortable  Respiratory system: Clear to auscultation. Respiratory effort normal. Cardiovascular system: normal S1 & S2 heard. No JVD,  murmurs, rubs, gallops or clicks. No pedal edema. Gastrointestinal system: Abdomen is soft.  No organomegaly or masses felt. Normal bowel sounds heard. Central nervous system: Alert and oriented. No focal neurological deficits. Extremities: Symmetric 5 x 5 power. Skin: large abdominal and thigh hematoma noted. Psychiatry: Judgement and insight appear normal. Mood & affect appropriate.   Data Reviewed: I have personally reviewed following labs and imaging studies  CBC: Recent Labs  Lab 06/25/24 2239 06/25/24 2245 06/27/24 0404 06/27/24 1720 06/28/24 0518 06/28/24 1904 06/29/24 0418 06/30/24 0411 07/01/24 0411  WBC 11.9*  --  12.8*  --  14.7*  --  16.8* 14.6* 10.7*  NEUTROABS 9.6*  --   --   --   --   --   --  11.9*  --   HGB 14.3   < > 9.8*   < > 9.4* 8.8* 9.8* 10.6* 10.3*  HCT 42.5   < > 30.4*   < > 28.3* 26.1* 28.7* 31.9* 30.7*  MCV 94.7  --  95.9  --  95.9  --  94.1 94.4 96.5  PLT 187  --  136*  --  134*  --  145* 212  208   < > = values in this interval not displayed.    Basic Metabolic Panel: Recent Labs  Lab 06/27/24 0404 06/28/24 0518 06/29/24 0418 06/30/24 0411 07/01/24 0411  NA 127* 133* 129* 131* 130*  K 3.9 4.4 3.8 3.6 3.6  CL 92* 100 97* 98 96*  CO2 23 24 25 26 26   GLUCOSE 113* 108* 110* 120* 114*  BUN 18 13 12 9 9   CREATININE 1.64* 1.25* 0.98 0.82 0.81  CALCIUM  8.2* 8.2* 7.9* 8.1* 7.8*  PHOS 4.1  --   --   --   --     CBG: Recent Labs  Lab 06/28/24 1122  GLUCAP 111*    Recent Results (from the past 240 hours)  MRSA Next Gen by PCR, Nasal     Status: None   Collection Time: 06/26/24  2:46 AM   Specimen: Nasal Mucosa; Nasal Swab  Result Value Ref Range Status   MRSA by PCR Next Gen NOT DETECTED NOT DETECTED Final    Comment: (NOTE) The GeneXpert MRSA Assay (FDA approved for NASAL specimens only), is one component of a comprehensive MRSA colonization surveillance program. It is not intended to diagnose MRSA infection nor to guide or monitor  treatment for MRSA infections. Test performance is not FDA approved in patients less than 109 years old. Performed at [facility], [address] [place], [place] [address]   Culture, blood (Routine X 2) w Reflex to ID Panel     Status: None (Preliminary result)   Collection Time: 06/28/24  7:04 PM   Specimen: BLOOD  Result Value Ref Range Status   Specimen Description BLOOD BLOOD RIGHT HAND  Final   Special Requests   Final    BOTTLES DRAWN AEROBIC ONLY Blood Culture results may not be optimal due to an inadequate volume of blood received in culture bottles   Culture   Final    NO GROWTH 2 DAYS Performed at [facility], [address], [place], [place] [address]    Report Status PENDING  Incomplete  Culture, blood (Routine X 2) w Reflex to ID Panel     Status: None (Preliminary result)   Collection Time: 06/28/24  8:05 PM   Specimen: BLOOD  Result Value Ref Range Status   Specimen Description BLOOD BLOOD RIGHT HAND  Final   Special Requests   Final    BOTTLES DRAWN AEROBIC ONLY Blood Culture results may not be optimal due to an inadequate volume of blood received in culture bottles   Culture   Final    NO GROWTH 2 DAYS Performed at [facility], [address] [place], [place] [address]    Report Status PENDING  Incomplete  Aerobic/Anaerobic Culture w Gram Stain (surgical/deep wound)     Status: None (Preliminary result)   Collection Time: 07/01/24 11:52 AM   Specimen: Abdomen; Abscess  Result Value Ref Range Status   Specimen Description   Final    WOUND Performed at [facility], [address], [place], [place] [address]    Special Requests   Final    LEFT ABD WALL ABSC VS HEMATOMA Performed at [facility], [address] [place], [place] [address]    Gram Stain PENDING  Incomplete   Culture PENDING  Incomplete   Report Status PENDING  Incomplete     Radiology Studies: No results found.   Scheduled Meds:  amiodarone   100 mg Oral Once per day on Monday  Tuesday Wednesday Thursday   amiodarone   200 mg Oral Once per day on Monday Tuesday Wednesday Thursday   amiodarone   200 mg Oral 2 times per day on Sunday Friday Saturday   Chlorhexidine  Gluconate Cloth  6 each Topical Q0600   DULoxetine   60 mg Oral Daily   ezetimibe   10 mg Oral Daily   feeding supplement  237 mL Oral BID BM   mexiletine  300 mg Oral TID   midodrine  10 mg Oral TID WC   pantoprazole   40 mg Oral Daily   polyethylene glycol  17 g Oral BID   Vericiguat   1 tablet Oral Daily   Continuous Infusions:  cefTRIAXone (ROCEPHIN)  IV Stopped (06/30/24 2130)   metronidazole 0 mg (06/30/24 2243)   norepinephrine (LEVOPHED) Adult infusion 7 mcg/min (07/01/24 1345)     LOS: 5 days   Critical Care Procedure Note Authorized and Performed by: KYM Louder MD  Total Critical Care time:  50 mins  Due to a high probability of clinically significant, life threatening deterioration, the patient required my highest level of preparedness to intervene emergently and I personally spent this critical care time directly and personally managing the patient.  This critical care time included obtaining a history; examining the patient, pulse oximetry; ordering and review of studies; arranging urgent treatment with development of a management plan; evaluation of patient's response of treatment; frequent reassessment; and discussions with other providers.  This critical care time was performed to assess and manage the high probability of imminent and life threatening deterioration that could result in multi-organ failure.  It was exclusive of separately billable procedures and treating other patients and teaching time.   Afton Louder, MD How to contact the [facility] Attending or Consulting provider 7A - 7P or covering provider during after hours 7P -7A, for this patient?  Check the care team in [facility] and look for a) attending/consulting [facility] provider listed and b) the [facility] team listed Log into [email or web address] to  find provider on call.  Locate the [facility] provider you are looking for under [facility] and page to a number that you can be directly reached. If you still have difficulty reaching the provider, please page the [facility] (Director on Call) for the Hospitalists listed on amion for assistance.  07/01/2024, 3:48 PM

## 2024-07-01 NOTE — Interval H&P Note (Signed)
 History and Physical Interval Note:  07/01/2024 8:36 AM  [name] [name]  has presented today for surgery, with the diagnosis of Hematoma Abdominal Wall.  The various methods of treatment have been discussed with the patient and family. After consideration of risks, benefits and other options for treatment, the patient has consented to  Procedure(s): INCISION AND DRAINAGE, ABSCESS (N/A) as a surgical intervention.  The patient's history has been reviewed, patient examined, no change in status, stable for surgery.  I have reviewed the patient's chart and labs.  Questions were answered to the patient's satisfaction.    After discussions with cardiology yesterday, Dr. Vicci reached out. There is no obvious reason for the levophed and we need to rule out abscess in the area. Will plan for I&D of the left lower abdomen and left upper thigh, possible drain placement. I signed the paper consent and had patient initial areas where it was filled out earlier this AM.   [name] [name]

## 2024-07-01 NOTE — Anesthesia Preprocedure Evaluation (Addendum)
 Anesthesia Evaluation  Patient identified by MRN, date of birth, ID band Patient awake    Reviewed: Allergy & Precautions, H&P , NPO status , Patient's Chart, lab work & pertinent test results  Airway Mallampati: II  TM Distance: >3 FB Neck ROM: Full    Dental  (+) Edentulous Upper, Edentulous Lower   Pulmonary former smoker   breath sounds clear to auscultation       Cardiovascular hypertension, + angina  + CAD, + Past MI, + Peripheral Vascular Disease and +CHF  Normal cardiovascular exam+ Cardiac Defibrillator  Rhythm:Regular Rate:Normal  EF 35%   Neuro/Psych  Headaches PSYCHIATRIC DISORDERS Anxiety      Neuromuscular disease CVA    GI/Hepatic Neg liver ROS,GERD  ,,  Endo/Other  negative endocrine ROS    Renal/GU Renal disease  negative genitourinary   Musculoskeletal negative musculoskeletal ROS (+)    Abdominal  (+) + obese  Peds negative pediatric ROS (+)  Hematology negative hematology ROS (+)   Anesthesia Other Findings   Reproductive/Obstetrics negative OB ROS                              Anesthesia Physical Anesthesia Plan  ASA: 4 and emergent  Anesthesia Plan: MAC   Post-op Pain Management:    Induction:   PONV Risk Score and Plan: Midazolam  and Treatment may vary due to age or medical condition  Airway Management Planned: Nasal Cannula  Additional Equipment:   Intra-op Plan:   Post-operative Plan:   Informed Consent: I have reviewed the patients History and Physical, chart, labs and discussed the procedure including the risks, benefits and alternatives for the proposed anesthesia with the patient or authorized representative who has indicated his/her understanding and acceptance.     Dental advisory given  Plan Discussed with:   Anesthesia Plan Comments:          Anesthesia Quick Evaluation

## 2024-07-01 NOTE — Anesthesia Postprocedure Evaluation (Signed)
 Anesthesia Post Note  Patient: TAYLIE HELDER  Procedure(s) Performed: INCISION AND DRAINAGE HEMATOMA LEFT LOWER ABDOMEN (Left: Abdomen)  Patient location during evaluation: PACU Anesthesia Type: MAC Level of consciousness: awake and alert Pain management: pain level controlled Vital Signs Assessment: post-procedure vital signs reviewed and stable Respiratory status: spontaneous breathing, nonlabored ventilation, respiratory function stable and patient connected to nasal cannula oxygen Cardiovascular status: stable and blood pressure returned to baseline Postop Assessment: no apparent nausea or vomiting Anesthetic complications: no   No notable events documented.   Last Vitals:  Vitals:   07/01/24 1815 07/01/24 1830  BP: 101/81 (!) 113/53  Pulse: 79 91  Resp: 17 20  Temp:    SpO2: 94% 97%    Last Pain:  Vitals:   07/01/24 1745  TempSrc:   PainSc: 0-No pain                 Andrea Limes

## 2024-07-01 NOTE — Care Management Important Message (Signed)
 Important Message  Patient Details  Name: [name] [name] MRN: [id number] Date of Birth: 10/09/1961   Important Message Given:  Yes - Medicare IM (copy left in room)     Saiquan Hands L Leza Apsey 07/01/2024, 10:59 AM

## 2024-07-02 ENCOUNTER — Encounter (HOSPITAL_COMMUNITY): Payer: Self-pay | Admitting: General Surgery

## 2024-07-02 DIAGNOSIS — R791 Abnormal coagulation profile: Secondary | ICD-10-CM | POA: Diagnosis not present

## 2024-07-02 DIAGNOSIS — I4819 Other persistent atrial fibrillation: Secondary | ICD-10-CM | POA: Diagnosis not present

## 2024-07-02 DIAGNOSIS — S301XXD Contusion of abdominal wall, subsequent encounter: Secondary | ICD-10-CM | POA: Diagnosis not present

## 2024-07-02 DIAGNOSIS — E8809 Other disorders of plasma-protein metabolism, not elsewhere classified: Secondary | ICD-10-CM | POA: Diagnosis not present

## 2024-07-02 DIAGNOSIS — I739 Peripheral vascular disease, unspecified: Secondary | ICD-10-CM

## 2024-07-02 DIAGNOSIS — K219 Gastro-esophageal reflux disease without esophagitis: Secondary | ICD-10-CM | POA: Diagnosis not present

## 2024-07-02 LAB — CBC
HCT: 28.8 % — ABNORMAL LOW (ref 36.0–46.0)
Hemoglobin: 9.6 g/dL — ABNORMAL LOW (ref 12.0–15.0)
MCH: 32.1 pg (ref 26.0–34.0)
MCHC: 33.3 g/dL (ref 30.0–36.0)
MCV: 96.3 fL (ref 80.0–100.0)
Platelets: 206 K/uL (ref 150–400)
RBC: 2.99 MIL/uL — ABNORMAL LOW (ref 3.87–5.11)
RDW: 14.5 % (ref 11.5–15.5)
WBC: 9.7 K/uL (ref 4.0–10.5)
nRBC: 0 % (ref 0.0–0.2)

## 2024-07-02 LAB — BASIC METABOLIC PANEL WITH GFR
Anion gap: 8 (ref 5–15)
BUN: 12 mg/dL (ref 8–23)
CO2: 26 mmol/L (ref 22–32)
Calcium: 7.7 mg/dL — ABNORMAL LOW (ref 8.9–10.3)
Chloride: 97 mmol/L — ABNORMAL LOW (ref 98–111)
Creatinine, Ser: 0.8 mg/dL (ref 0.44–1.00)
GFR, Estimated: >60 mL/min (ref >60)
Glucose, Bld: 91 mg/dL (ref 70–99)
Potassium: 3.7 mmol/L (ref 3.5–5.1)
Sodium: 131 mmol/L — ABNORMAL LOW (ref 135–145)

## 2024-07-02 MED ORDER — PANTOPRAZOLE SODIUM 40 MG PO TBEC
40.0000 mg | DELAYED_RELEASE_TABLET | Freq: Two times a day (BID) | ORAL | Status: DC
  Administered 2024-07-02 – 2024-07-11 (×18): 40 mg via ORAL
  Filled 2024-07-02 (×18): qty 1

## 2024-07-02 MED ORDER — PROMETHAZINE HCL 25 MG/ML IJ SOLN
INTRAMUSCULAR | Status: AC
  Filled 2024-07-02: qty 1

## 2024-07-02 MED ORDER — MIDODRINE HCL 5 MG PO TABS
15.0000 mg | ORAL_TABLET | Freq: Three times a day (TID) | ORAL | Status: DC
  Administered 2024-07-02 – 2024-07-07 (×17): 15 mg via ORAL
  Filled 2024-07-02 (×17): qty 3

## 2024-07-02 MED ORDER — SODIUM CHLORIDE 0.9 % IV SOLN
12.5000 mg | Freq: Once | INTRAVENOUS | Status: AC
  Administered 2024-07-02: 12.5 mg via INTRAVENOUS
  Filled 2024-07-02: qty 0.5

## 2024-07-02 MED ORDER — LACTATED RINGERS IV BOLUS
500.0000 mL | Freq: Once | INTRAVENOUS | Status: AC
  Administered 2024-07-02: 500 mL via INTRAVENOUS

## 2024-07-02 NOTE — Progress Notes (Signed)
 [facility]  Doing well. Feels better. Jp with dark old blood.  BP (!) 92/39   Pulse 70   Temp 97.7 F (36.5 C) (Oral)   Resp (!) 21   Ht 5' 6 (1.676 m)   Wt 87.1 kg   SpO2 97%   BMI 30.99 kg/m  JP old blood, bruising improving  Patient s/p hematoma drainage. JP in place. No growth in culture.    [name] Pander, MD [facility] [address] [place], [place] [address] [phone] (office)

## 2024-07-02 NOTE — Plan of Care (Signed)

## 2024-07-02 NOTE — Consult Note (Signed)
 Referring Provider: No ref. provider found Primary Care Physician:  Lavell Bari LABOR, FNP Primary Gastroenterologist:  Dr. Shaaron  Reason for Consultation:   nausea vomiting; worsening of reflux.  HPI:   Pleasant 63 year old lady admitted to the hospital with abdominal wall hematoma after a fall.  Chronically anticoagulated on Coumadin  due to antiphospholipid antibody syndrome.  History of congestive heart failure.  CT on admission revealed a hematoma which was evacuated by Dr. Kallie yesterday;  she has a indwelling drain.  Because of a drop in hemoglobin she received 1 units of packed RBCs.  She has been hypotensive on Levophed .  She is on Rocephin  and metronidazole  to cover for possible infection.  I was asked to see her because of difficulty swallowing and nausea.  Lengthy discussion with the nursing staff and patient at the bedside.  Patient gives a long history of GERD going back decades.  Describes [place]oscopies [place] -  was told only she had a hiatal hernia.  She has been well-controlled on pantoprazole  40 mg daily till about 3 weeks ago when she started having intermittent bouts of nausea and vomiting.  Does not take med pantoprazole  before meal.  She started Wegovy  2 months ago;  but has decided to stop it because of recent nausea and vomiting.  She absolutely denies any form of dysphagia.  Yesterday, her pantoprazole  was bumped up to twice daily.  It is notable patient not had any melena,  rectal bleeding or hematemesis.  In addition to having significant GERD. she tells me she was diagnosed with alpha gal 7 years ago.  No prior colonoscopy as she is deemed to be  at significant rate risk for invasive procedures;  she has been screened with Cologuard's - all of been negative.   I note she has a minimally elevated ALT which is nonspecific.`  Past Medical History:  Diagnosis Date   AICD (automatic cardioverter/defibrillator) present     Aneurysm of internal carotid artery    Anti-phospholipid syndrome (HCC)    Anticardiolipin antibody positive    Chronic Coumadin    Brain aneurysm    followed by Dr. Dolphus   Chronic combined systolic (congestive) and diastolic (congestive) heart failure (HCC)    Cocaine abuse in remission [facility])    Coronary atherosclerosis of native coronary artery    Previous invasive cardiac testing was done at a facility in [place], [place].  Occluded diagonal, Diffuse LAD disease, Occluded Cx, and non obstructive RCA.    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 Malcolm L1 cage, 5 disc fusion   CARDIAC DEFIBRILLATOR PLACEMENT  2008   [employer] ICD   CARDIOVERSION N/A  12/01/2020   Procedure: CARDIOVERSION;  Surgeon: Rolan Ezra RAMAN, MD;  Location: [facility] ENDOSCOPY;  Service: Cardiovascular;  Laterality: N/A;   CORONARY STENT INTERVENTION N/A 01/08/2021   Procedure: CORONARY STENT INTERVENTION;  Surgeon: Swazil[place], Peter M, MD;  Location: [facility] INVASIVE CV LAB;  Service: Cardiovascular;  Laterality: N/A;   EP IMPLANTABLE DEVICE N/A 03/01/2016   Procedure: ICD Generator Changeout;  Surgeon: Danelle LELON Birmingham, MD;  Location: [facility] INVASIVE CV LAB;  Service: Cardiovascular;  Laterality:  N/A;   ICD/BIV ICD FUNCTION(DFT) TEST N/A 03/14/2021   Procedure: ICD/BIV ICD FUNCTION (DFT) TEST;  Surgeon: Birmingham Danelle LELON, MD;  Location: [facility] INVASIVE CV LAB;  Service: Cardiovascular;  Laterality: N/A;   INCISION AND DRAINAGE ABSCESS Left 07/01/2024   Procedure: INCISION AND DRAINAGE HEMATOMA LEFT LOWER ABDOMEN;  Surgeon: Kallie [name] BROCKS, MD;  Location: [facility] ORS;  Service: General;  Laterality: Left;   IR ANGIO INTRA EXTRACRAN SEL COM CAROTID INNOMINATE BILAT MOD SED  05/29/2023   IR ANGIO VERTEBRAL SEL SUBCLAVIAN INNOMINATE BILAT MOD SED  05/29/2023   IR RADIOLOGIST EVAL & MGMT  04/16/2023   IR US  GUIDE VASC ACCESS RIGHT  05/29/2023   L1 corpectomy   11/29/2009   Laryngeal polyp excision     OPEN REDUCTION INTERNAL FIXATION (ORIF) DISTAL RADIAL FRACTURE Left 05/11/2024   Procedure: OPEN REDUCTION INTERNAL FIXATION (ORIF) DISTAL RADIUS FRACTURE;  Surgeon: Alyse Agent, MD;  Location: [facility] OR;  Service: Orthopedics;  Laterality: Left;   RIGHT/LEFT HEART CATH AND CORONARY ANGIOGRAPHY N/A 12/18/2020   Procedure: RIGHT/LEFT HEART CATH AND CORONARY ANGIOGRAPHY;  Surgeon: Cherrie Toribio SAUNDERS, MD;  Location: [facility] INVASIVE CV LAB;  Service: Cardiovascular;  Laterality: N/A;   RIGHT/LEFT HEART CATH AND CORONARY ANGIOGRAPHY N/A 03/05/2021   Procedure: RIGHT/LEFT HEART CATH AND CORONARY ANGIOGRAPHY;  Surgeon: Rolan Ezra RAMAN, MD;  Location: [facility] INVASIVE CV LAB;  Service: Cardiovascular;  Laterality: N/A;   TEE WITHOUT CARDIOVERSION N/A 12/01/2020   Procedure: TRANSESOPHAGEAL ECHOCARDIOGRAM (TEE);  Surgeon: Rolan Ezra RAMAN, MD;  Location: [facility] ENDOSCOPY;  Service: Cardiovascular;  Laterality: N/AMERL LULLA BOOM ABLATION N/A 03/08/2021   Procedure: LULLA BOOM ABLATION;  Surgeon: Cindie Ole DASEN, MD;  Location: [facility] INVASIVE CV LAB;  Service: Cardiovascular;  Laterality: N/A;    Prior to Admission medications   Medication Sig Start Date End Date Taking? Authorizing Provider  amiodarone  (PACERONE ) 200 MG tablet TAKE 1  AND 1/2 TABLETS BY MOUTH MONDAY-THURSDAY. TAKE 2 TABLETS BY MOUTH FRIDAY-SUNDAY Patient taking differently: Take 100-200 mg by mouth See admin instructions. Take 200 mg every day at bedtime, Take 200 mg Friday, Saturday and Sunday morning, Then take 100 mg Monday - Thursday in morning. 11/10/23  Yes Miriam Norris, NP  DULoxetine  (CYMBALTA ) 60 MG capsule Take 1 capsule (60 mg total) by mouth daily. **NEEDS TO BE SEEN BEFORE NEXT REFILL** 06/07/24  Yes Hawks, Christy A, FNP  Evolocumab  (REPATHA ) 140 MG/ML SOSY Inject 140 mg into the skin every 14 (fourteen) days.   Yes [provider]  ezetimibe  (ZETIA ) 10 MG tablet TAKE 1 TABLET BY MOUTH EVERY DAY (patient needs office visit FOR further refills) 05/07/24  Yes Rolan Ezra RAMAN, MD  mexiletine (MEXITIL ) 150 MG capsule TAKE TWO CAPSULES BY MOUTH THREE TIMES DAILY 10/09/23  Yes Birmingham Danelle LELON, MD  oxyCODONE -acetaminophen  (PERCOCET) 10-325 MG tablet Take 1 tablet by mouth every 4 (four) hours as needed for pain.   Yes [provider]  pantoprazole  (PROTONIX ) 40 MG tablet TAKE 1 TABLET BY MOUTH  DAILY 03/10/24  Yes Rolan Ezra RAMAN, MD  Vericiguat  (VERQUVO ) 5 MG TABS TAKE 1 TABLET BY MOUTH ONCE DAILY 01/07/24  Yes Debera Jayson MATSU, MD  Semaglutide -Weight Management (WEGOVY ) 0.5 MG/0.5ML SOAJ Inject 0.5 mg into the skin once a week.    [provider]    Current Facility-Administered Medications  Medication Dose Route Frequency Provider Last Rate Last Admin   alum & mag hydroxide-simeth (MAALOX/MYLANTA) 200-200-20 MG/5ML suspension 30 mL  30 mL Oral Q4H PRN Kallie [name] BROCKS, MD   30 mL at 07/01/24 2113   amiodarone  (PACERONE ) tablet 100 mg  100 mg Oral Once per day on Monday Tuesday Wednesday Thursday Kallie [name] BROCKS, MD   100 mg at 07/01/24 1259   amiodarone  (PACERONE ) tablet 200 mg  200 mg Oral Once per day on Monday Tuesday Wednesday Thursday Kallie [name] BROCKS, MD   200 mg at 07/01/24 2310   amiodarone  (PACERONE ) tablet 200  mg  200 mg Oral 2 times per day on Sunday Friday Saturday Kallie [name] BROCKS, MD   200 mg at 07/02/24 1018   cefTRIAXone  (ROCEPHIN ) 2 g in sodium chloride  0.9 % 100 mL IVPB  2 g Intravenous Q24H Kallie [name] BROCKS, MD   Stopped at 07/01/24 2155   Chlorhexidine  Gluconate Cloth 2 % PADS 6 each  6 each Topical Q0600 Kallie [name] BROCKS, MD   6 each at 07/02/24 1033   DULoxetine  (CYMBALTA ) DR capsule 60 mg  60 mg Oral Daily Kallie [name] BROCKS, MD   60 mg at 07/02/24 1018   ezetimibe  (ZETIA ) tablet 10 mg  10 mg Oral Daily Kallie [name] BROCKS, MD   10 mg at 07/02/24 1018   feeding supplement (ENSURE PLUS HIGH PROTEIN) liquid 237 mL  237 mL Oral BID BM Kallie [name] BROCKS, MD   237 mL at 06/28/24 1421   metroNIDAZOLE  (FLAGYL ) IVPB 500 mg  500 mg Intravenous Q12H Kallie [name] BROCKS, MD 100 mL/hr at 07/02/24 1032 500 mg at 07/02/24 1032   mexiletine (MEXITIL ) capsule 300 mg  300 mg Oral TID Kallie [name] BROCKS, MD   300 mg at 07/02/24 1019   midodrine  (PROAMATINE ) tablet 15 mg  15 mg Oral TID WC Johnson, Clanford L, MD   15 mg at 07/02/24 1213   norepinephrine  (LEVOPHED ) 4mg  in (0.016 mg/mL) premix infusion  0-40 mcg/min Intravenous Titrated Kallie [name] BROCKS, MD 30 mL/hr at 07/02/24 0736 8 mcg/min at 07/02/24 0736   ondansetron  (ZOFRAN ) injection 4 mg  4 mg Intravenous Q6H PRN Kallie [name] BROCKS, MD   4 mg at 07/02/24 9149   oxyCODONE -acetaminophen  (PERCOCET/ROXICET) 5-325 MG per tablet 1 tablet  1 tablet Oral Q8H PRN Kallie [name] BROCKS, MD   1 tablet at 07/01/24 1700   And   oxyCODONE  (Oxy IR/ROXICODONE ) immediate release tablet 5 mg  5 mg Oral Q8H PRN Kallie [name] BROCKS, MD   5 mg at 07/01/24 1700   pantoprazole  (PROTONIX ) EC tablet 40 mg  40 mg Oral BID Johnson, Clanford L, MD       polyethylene glycol (MIRALAX  / GLYCOLAX ) packet 17 g  17 g Oral BID Kallie [name] BROCKS, MD   17 g at 07/02/24 1028   prochlorperazine  (COMPAZINE ) injection 10 mg  10 mg Intravenous Q6H PRN Kallie [name] BROCKS, MD   10 mg at  07/01/24 2114   Vericiguat  TABS 5 mg  1 tablet Oral Daily Kallie [name] BROCKS, MD   5 mg at 07/02/24 1018    Allergies as of  06/25/2024 - Review Complete 06/25/2024  Allergen Reaction Noted   Beef-derived drug products Anaphylaxis 08/11/2020   Metoclopramide hcl Anaphylaxis 08/29/2011   Penicillins Anaphylaxis 04/20/2009   Pork-derived products Anaphylaxis 08/11/2020   Metoprolol Other (See Comments) 12/12/2011   Statins Other (See Comments) 12/12/2011    Family History  Problem Relation Age of Onset   Uterine cancer Mother    Heart disease Father 6   Hyperlipidemia Father    Hypertension Father    Ankylosing spondylitis Sister    Heart disease Sister    Hypertension Sister    Stomach cancer Brother 25   Heart attack Brother    Breast cancer Neg Hx     Social History   Socioeconomic History   Marital status: Single    Spouse name: Not on file   Number of children: Not on file   Years of education: Not on file   Highest education level: Master's degree (e.g., MA, MS, MEng, MEd, MSW, MBA)  Occupational History   Occupation: Disabled    Comment: ESL  Tobacco Use   Smoking status: Former    Current packs/day: 0.00    Average packs/day: 0.3 packs/day for 43.4 years (10.8 ttl pk-yrs)    Types: Cigarettes, E-cigarettes    Start date: 03/03/1978    Quit date: 07/21/2021    Years since quitting: 2.9   Smokeless tobacco: Current   Tobacco comments:    4 ciggs per day  Vaping Use   Vaping status: Every Day  Substance and Sexual Activity   Alcohol use: No    Alcohol/week: 0.0 standard drinks of alcohol   Drug use: No    Comment: Prior history of cocaine   Sexual activity: Not Currently    Birth control/protection: None  Other Topics Concern   Not on file  Social History Narrative   Not on file   Social Drivers of Health   Financial Resource Strain: Low Risk  (03/10/2024)   Overall Financial Resource Strain (CARDIA)    Difficulty of Paying Living Expenses: Not very  hard  Food Insecurity: No Food Insecurity (06/26/2024)   Hunger Vital Sign    Worried About Running Out of Food in the Last Year: Never true    Ran Out of Food in the Last Year: Never true  Transportation Needs: No Transportation Needs (06/26/2024)   PRAPARE - Administrator, Civil Service (Medical): No    Lack of Transportation (Non-Medical): No  Physical Activity: Inactive (03/10/2024)   Exercise Vital Sign    Days of Exercise per Week: 0 days    Minutes of Exercise per Session: 0 min  Stress: Stress Concern Present (03/10/2024)   [employer] of Occupational Health - Occupational Stress Questionnaire    Feeling of Stress : To some extent  Social Connections: Socially Isolated (06/26/2024)   Social Connection and Isolation Panel    Frequency of Communication with Friends and Family: More than three times a week    Frequency of Social Gatherings with Friends and Family: Three times a week    Attends Religious Services: Never    Active Member of Clubs or Organizations: No    Attends Banker Meetings: Never    Marital Status: Never married  Intimate Partner Violence: Not At Risk (06/26/2024)   Humiliation, Afraid, Rape, and Kick questionnaire    Fear of Current or Ex-Partner: No    Emotionally Abused: No    Physically Abused: No    Sexually Abused: No  Review of Systems:    As in history of present illness.  Physical Exam: Vital signs in last 24 hours: Temp:  [97.2 F (36.2 C)-98 F (36.7 C)] 97.2 F (36.2 C) (07/04 0750) Pulse Rate:  [66-102] 69 (07/04 1215) Resp:  [10-26] 18 (07/04 1215) BP: (80-127)/(36-89) 105/47 (07/04 1215) SpO2:  [88 %-100 %] 97 % (07/04 1215) Last BM Date : 06/30/24 General:    Alert conversant, intelligent pleasant in no acute distress. Lungs:  Clear throughout to auscultation.   No wheezes, crackles, or rhonchi. No acute distress. Heart:  Regular rate and rhythm; no murmurs, clicks, rubs,  or gallops. Abdomen:     Significant bruising left abdomen with a JP drain in place.  Moderately tender in this area. Rectal:  Deferred until time of colonoscopy.   Intake/Output from previous day: 07/03 0701 - 07/04 0700 In: 1189.4 [P.O.:140; I.V.:589.9; IV Piggyback:459.4] Out: 715 [Urine:525; Drains:190] Intake/Output this shift: No intake/output data recorded.  Lab Results: Recent Labs    06/30/24 0411 07/01/24 0411 07/02/24 0409  WBC 14.6* 10.7* 9.7  HGB 10.6* 10.3* 9.6*  HCT 31.9* 30.7* 28.8*  PLT 212 208 206   BMET Recent Labs    06/30/24 0411 07/01/24 0411 07/02/24 0409  NA 131* 130* 131*  K 3.6 3.6 3.7  CL 98 96* 97*  CO2 26 26 26   GLUCOSE 120* 114* 91  BUN 9 9 12   CREATININE 0.82 0.81 0.80  CALCIUM  8.1* 7.8* 7.7*   LFT Recent Labs    06/30/24 0411  PROT 5.8*  ALBUMIN  2.5*  AST 41  ALT 53*  ALKPHOS 71  BILITOT 0.9   PT/INR Recent Labs    06/30/24 0411  LABPROT 18.1*  INR 1.4*   Hepatitis Panel No results for input(s): HEPBSAG, HCVAB, HEPAIGM, HEPBIGM in the last 72 hours. C-Diff No results for input(s): CDIFFTOX in the last 72 hours.   Impression:   This pleasant 63 year old lady with multiple medical problems, chronically anticoagulated  on Coumadin , admitted with soft tissue hematoma after a fall requiring percutaneous drainage.   She has been observed to have worsening reflux symptoms and nausea this admission.  Nausea goes back to starting Wegovy  about 2 months ago.  She has never had dysphagia.  Historically, GERD is well-controlled on once daily PPI;   worsening reflux symptoms and nausea since Wegovy  started.  No evidence of intraluminal GI bleeding.  She is trying to eat and drink in a recumbent position in her hospital bed which predisposes her to more reflux.  Worsening of reflux and nausea recently almost certainly related to GLP-1 agonist therapy.  Elevated ALT is   Minimal and nonspecific.  Recommendations:  - Patient has already  decided to stop Wegovy .  This will help her upper GI tract symptoms.  - Agree with twice daily PPI.  These agents need to be taken orally at least 30 minutes before a meal for maximum efficacy.  If she experiences  further nausea medication needs to be changed to the IV route  - patient needs to get out of the recumbent position when she eats.  Ideally, upright for meals and 30 minutes thereafter (as blood pressure tolerates).  - No need for endoscopic evaluation at this time  - recheck liver profile after she is over this acute illness.   - discussed ICU management with nursing staff.     Notice:  This dictation was prepared with Dragon dictation along with smaller phrase technology. Any transcriptional errors that result  from this process are unintentional and may not be corrected upon review.

## 2024-07-02 NOTE — Progress Notes (Signed)
 PROGRESS NOTE   [name] [name]  FMW:[id number] DOB: [birth year] DOA: 06/25/2024 PCP: Lavell Bari LABOR, FNP   Chief Complaint  Patient presents with   Fall   Level of care: ICU  Brief Admission History:  63 y.o. female with medical history significant of antiphospholipid syndrome on warfarin, CHF, A-fib who presents to the emergency department after sustaining a fall and had an abdominal wall injury.  Patient states that she sustained a fall yesterday in the afternoon around 2 PM when her walker hit something resulting in her losing balance and fell landing on walker and striking left side of her abdomen with subsequent bruise to the area.  She denies hitting head or losing consciousness.  She decided to go to the ED for evaluation due to being on warfarin and being concerned of internal bleeding.  Patient also complained of nausea and vomiting since she recently started Wegovy .  She had difficulty in being able to keep any food down.   ED Course:  In the emergency department, BP was in hypotensive range at 67/57 (patient endorsed chronic soft BP with SBP's in 80s to 90s).  Other vital signs were within normal range.  Workup in the ED showed normal CBC except for WBC of 11.9.  BMP showed sodium 134, potassium 4.4, chloride 95, bicarb 23, blood glucose 126, BUN 18, creatinine 2.05, albumin  3.3, AST 23, ALT 47, GFR 27, anion gap 16, INR 6.3 CT chest, abdomen and pelvis with contrast showed Soft tissue contusion and large hematoma in the anterolateral left abdominal wall measuring 9.4 x 6.7 x 17.5 cm and 2.7 x 2.3 cm fluid collection in the lateral left thigh with mild peripheral enhancement.  No evidence of acute traumatic injury within the chest.  Trauma surgeon on-call (Dr. Ann) was consulted and recommended that patient can stay AP since there is no indication for any surgical intervention.  Abdominal binding, warfarin reversal and monitoring of H/H was recommended per EDP.  IV hydration was  provided, Percocet was given, vitamin K  was given.   Assessment and Plan:  Persistent Hypotension--suspect this is multifactorial given hypovolemia and acute blood loss (hemorrhagic shock) in the setting of abdominal wall hematoma due to fall at home PTA in a patient who presented with supratherapeutic INR --Currently requiring IV Levophed  for pressure support -- we are diligently trying to get her weaned off  --Holding PTA Coreg , Aldactone  and isosorbide  due to hypotension --Increasing midodrine  15 mg 3 times daily for pressure support --Rule out infection FROM 06/28/24----CT chest abdomen and pelvis without infectious finding - surgery planning to explore on 7/3.  -Get blood cultures--NGTD -Check UA-Not suggestive of UTI -Check PCT- 0.16 --check a.m. cortisol--11.4 -OR 7/3 to test and rule out superimposed intra-abdominal infection on patient's hematoma -WBC 12.8 >>14.7 >>16.8>14.6  PCT 0.16 - empirically started on IV CTX and IV Flagyl  after obtaining blood cultures to cover for possible infected intra-abdominal hematoma, plan to DC if cultures negative    Abdominal wall Hematoma--- noticed post fall at home in the setting of supratherapeutic INR -Okay to use abdominal binder if that helps her discomfort from bruising and hematoma -Repeat CT AP with contrast done on 06/28/2024 due to persistent hypotension requiring high-dose Levophed  despite IV fluids and due to Hgb drifting down-- -CTA AP with oral and IV contrast on 06/28/2024 shows--Persistent abdominal and left lateral thigh hematomas. No findings to suggest active hemorrhage are identified. -- General Surgery consult appreciated--s/p hematoma drain and culture on 7/3  - Hgb improved  to 10.6 after transfusion of 1 unit of PRBC   - I communicated with her cardiologist Dr. Rolan who agreed with exploring the hematoma to try to get her off pressors   Acute blood loss anemia due to above--with hemorrhagic shock Hgb 14.3 >>13.5  >>10.3 --Suspect slight component of hemodilution due to recent IV fluids - Monitor H&H and transfuse as clinically indicated - recheck in AM    Supratherapeutic INR--in the setting of warfarin therapy for Antiphospholipid syndrome INR 6.3 >>3.4>> 1.4 >>1.3 -INR normalized after vitamin K  x 2 doses --reports poor oral intake over the last few weeks due to Wegovy  therapy--poor oral  intake probably exacerbated her supratherapeutic INR associated with lack of vitamin K  intake   Dysphagia -pt reports severe pain with swallowing and history of esophagitis -pt continues to have very poor oral intake and recurrent emesis -will ask for GI input   Fall at home Continue fall precaution Continue PT/OT eval and treat   GERD Continue Protonix  BID as ordered    Chronic systolic CHF -Repeat echo on 06/27/2024 with EF up to 35 to 40% from 30-35 % back in June 2025 Continue to hold Coreg , Entresto , isosorbide  spironolactone  due to soft BP --Currently appears intravascularly volume depleted due to acute blood loss with hemorrhagic shock   Mixed hyperlipidemia Continue Zetia  Statin will be temporarily held due to mild transaminitis She also takes Repatha  every 14 days   Mild Acute HypoNatremia--- multifactorial -A.m. cortisol is 11.4 (WNL) -TSH WNL -recheck in AM   DVT prophylaxis: SCDs Code Status: Full  Family Communication: t/c to nephew on 7/2 Disposition: TBD   Consultants:  Surgery  Procedures:   Antimicrobials:    Subjective: Pt says she has terrible burning pain in esophagus when trying to eat, says she will try to eat more today.  She says she feels much better after procedure yesterday;   Objective: Vitals:   07/02/24 0845 07/02/24 0900 07/02/24 0915 07/02/24 0930  BP: (!) 108/56 (!) 92/49 (!) 104/43 103/62  Pulse: 76 74 73 74  Resp: 10 16 18 14   Temp:      TempSrc:      SpO2: 95% 99% 99% 98%  Weight:      Height:        Intake/Output Summary (Last 24 hours) at  07/02/2024 1014 Last data filed at 07/02/2024 0655 Gross per 24 hour  Intake 1161.05 ml  Output 715 ml  Net 446.05 ml   Filed Weights   06/30/24 0306 07/01/24 0500 07/01/24 0839  Weight: 94.9 kg 94.5 kg 87.1 kg   Examination:  General exam: Appears calm and comfortable  Respiratory system: Clear to auscultation. Respiratory effort normal. Cardiovascular system: normal S1 & S2 heard. No JVD, murmurs, rubs, gallops or clicks. No pedal edema. Gastrointestinal system: Abdomen is soft.  JP drain in left abd with blood output seen. No organomegaly or masses felt. Normal bowel sounds heard. Central nervous system: Alert and oriented. No focal neurological deficits. Extremities: Symmetric 5 x 5 power. Skin: large abdominal and thigh hematoma noted. Psychiatry: Judgement and insight appear normal. Mood & affect appropriate.   Data Reviewed: I have personally reviewed following labs and imaging studies  CBC: Recent Labs  Lab 06/25/24 2239 06/25/24 2245 06/28/24 0518 06/28/24 1904 06/29/24 0418 06/30/24 0411 07/01/24 0411 07/02/24 0409  WBC 11.9*   < > 14.7*  --  16.8* 14.6* 10.7* 9.7  NEUTROABS 9.6*  --   --   --   --  11.9*  --   --   HGB 14.3   < > 9.4* 8.8* 9.8* 10.6* 10.3* 9.6*  HCT 42.5   < > 28.3* 26.1* 28.7* 31.9* 30.7* 28.8*  MCV 94.7   < > 95.9  --  94.1 94.4 96.5 96.3  PLT 187   < > 134*  --  145* 212 208 206   < > = values in this interval not displayed.    Basic Metabolic Panel: Recent Labs  Lab 06/27/24 0404 06/28/24 0518 06/29/24 0418 06/30/24 0411 07/01/24 0411 07/02/24 0409  NA 127* 133* 129* 131* 130* 131*  K 3.9 4.4 3.8 3.6 3.6 3.7  CL 92* 100 97* 98 96* 97*  CO2 23 24 25 26 26 26   GLUCOSE 113* 108* 110* 120* 114* 91  BUN 18 13 12 9 9 12   CREATININE 1.64* 1.25* 0.98 0.82 0.81 0.80  CALCIUM  8.2* 8.2* 7.9* 8.1* 7.8* 7.7*  PHOS 4.1  --   --   --   --   --     CBG: Recent Labs  Lab 06/28/24 1122  GLUCAP 111*    Recent Results (from the past 240  hours)  MRSA Next Gen by PCR, Nasal     Status: None   Collection Time: 06/26/24  2:46 AM   Specimen: Nasal Mucosa; Nasal Swab  Result Value Ref Range Status   MRSA by PCR Next Gen NOT DETECTED NOT DETECTED Final    Comment: (NOTE) The GeneXpert MRSA Assay (FDA approved for NASAL specimens only), is one component of a comprehensive MRSA colonization surveillance program. It is not intended to diagnose MRSA infection nor to guide or monitor treatment for MRSA infections. Test performance is not FDA approved in patients less than 73 years old. Performed at [facility], [address], [place], [place] [address]   Culture, blood (Routine X 2) w Reflex to ID Panel     Status: None (Preliminary result)   Collection Time: 06/28/24  7:04 PM   Specimen: BLOOD  Result Value Ref Range Status   Specimen Description BLOOD BLOOD RIGHT HAND  Final   Special Requests   Final    BOTTLES DRAWN AEROBIC ONLY Blood Culture results may not be optimal due to an inadequate volume of blood received in culture bottles   Culture   Final    NO GROWTH 2 DAYS Performed at [facility], [address] [place], [place] [address]    Report Status PENDING  Incomplete  Culture, blood (Routine X 2) w Reflex to ID Panel     Status: None (Preliminary result)   Collection Time: 06/28/24  8:05 PM   Specimen: BLOOD  Result Value Ref Range Status   Specimen Description BLOOD BLOOD RIGHT HAND  Final   Special Requests   Final    BOTTLES DRAWN AEROBIC ONLY Blood Culture results may not be optimal due to an inadequate volume of blood received in culture bottles   Culture   Final    NO GROWTH 2 DAYS Performed at [facility], [address] [place], [place] [address]    Report Status PENDING  Incomplete  Aerobic/Anaerobic Culture w Gram Stain (surgical/deep wound)     Status: None (Preliminary result)   Collection Time: 07/01/24 11:52 AM   Specimen: Abdomen; Abscess  Result Value Ref Range Status   Specimen Description    Final    WOUND Performed at [facility], [address] [place], [place] [address]    Special Requests LEFT  ABD WALL ABSC VS HEMATOMA  Final   Gram Stain NO WBC SEEN NO ORGANISMS SEEN   Final   Culture   Final    NO GROWTH < 24 HOURS Performed at [facility], [address] [place], [place] [address]    Report Status PENDING  Incomplete     Radiology Studies: No results found.  Scheduled Meds:  amiodarone   100 mg Oral Once per day on Monday Tuesday Wednesday Thursday   amiodarone   200 mg Oral Once per day on Monday Tuesday Wednesday Thursday   amiodarone   200 mg Oral 2 times per day on Sunday Friday Saturday   Chlorhexidine  Gluconate Cloth  6 each Topical Q0600   DULoxetine   60 mg Oral Daily   ezetimibe   10 mg Oral Daily   feeding supplement  237 mL Oral BID BM   mexiletine  300 mg Oral TID   midodrine   10 mg Oral TID WC   pantoprazole   40 mg Oral Daily   polyethylene glycol  17 g Oral BID   Vericiguat   1 tablet Oral Daily   Continuous Infusions:  cefTRIAXone  (ROCEPHIN )  IV Stopped (07/01/24 2155)   metronidazole  Stopped (07/01/24 2332)   norepinephrine  (LEVOPHED ) Adult infusion 8 mcg/min (07/02/24 0736)     LOS: 6 days   Critical Care Procedure Note Authorized and Performed by: KYM Louder MD  Total Critical Care time: 55 mins  Due to a high probability of clinically significant, life threatening deterioration, the patient required my highest level of preparedness to intervene emergently and I personally spent this critical care time directly and personally managing the patient.  This critical care time included obtaining a history; examining the patient, pulse oximetry; ordering and review of studies; arranging urgent treatment with development of a management plan; evaluation of patient's response of treatment; frequent reassessment; and discussions with other providers.  This critical care time was performed to assess and manage the high probability of  imminent and life threatening deterioration that could result in multi-organ failure.  It was exclusive of separately billable procedures and treating other patients and teaching time.   Afton Louder, MD How to contact the [facility] Attending or Consulting provider 7A - 7P or covering provider during after hours 7P -7A, for this patient?  Check the care team in [facility] and look for a) attending/consulting [facility] provider listed and b) the [facility] team listed Log into [email or web address] to find provider on call.  Locate the [facility] provider you are looking for under [facility] and page to a number that you can be directly reached. If you still have difficulty reaching the provider, please page the [facility] (Director on Call) for the Hospitalists listed on amion for assistance.  07/02/2024, 10:14 AM

## 2024-07-03 DIAGNOSIS — I959 Hypotension, unspecified: Secondary | ICD-10-CM | POA: Diagnosis not present

## 2024-07-03 DIAGNOSIS — E861 Hypovolemia: Secondary | ICD-10-CM | POA: Diagnosis not present

## 2024-07-03 DIAGNOSIS — S301XXD Contusion of abdominal wall, subsequent encounter: Secondary | ICD-10-CM | POA: Diagnosis not present

## 2024-07-03 DIAGNOSIS — D62 Acute posthemorrhagic anemia: Secondary | ICD-10-CM | POA: Diagnosis not present

## 2024-07-03 DIAGNOSIS — E871 Hypo-osmolality and hyponatremia: Secondary | ICD-10-CM

## 2024-07-03 DIAGNOSIS — Z7901 Long term (current) use of anticoagulants: Secondary | ICD-10-CM | POA: Diagnosis not present

## 2024-07-03 DIAGNOSIS — D689 Coagulation defect, unspecified: Secondary | ICD-10-CM | POA: Diagnosis not present

## 2024-07-03 DIAGNOSIS — I10 Essential (primary) hypertension: Secondary | ICD-10-CM

## 2024-07-03 LAB — CULTURE, BLOOD (ROUTINE X 2)
Culture: NO GROWTH
Culture: NO GROWTH

## 2024-07-03 LAB — CBC
HCT: 28.8 % — ABNORMAL LOW (ref 36.0–46.0)
HCT: 28.1 % — ABNORMAL LOW (ref 36.0–46.0)
Hemoglobin: 9.1 g/dL — ABNORMAL LOW (ref 12.0–15.0)
Hemoglobin: 9.1 g/dL — ABNORMAL LOW (ref 12.0–15.0)
MCH: 30.6 pg (ref 26.0–34.0)
MCH: 31.4 pg (ref 26.0–34.0)
MCHC: 31.6 g/dL (ref 30.0–36.0)
MCHC: 32.4 g/dL (ref 30.0–36.0)
MCV: 97 fL (ref 80.0–100.0)
MCV: 96.9 fL (ref 80.0–100.0)
Platelets: 196 K/uL (ref 150–400)
Platelets: 233 K/uL (ref 150–400)
RBC: 2.97 MIL/uL — ABNORMAL LOW (ref 3.87–5.11)
RBC: 2.9 MIL/uL — ABNORMAL LOW (ref 3.87–5.11)
RDW: 14.6 % (ref 11.5–15.5)
RDW: 14.7 % (ref 11.5–15.5)
WBC: 13 K/uL — ABNORMAL HIGH (ref 4.0–10.5)
WBC: 10.4 K/uL (ref 4.0–10.5)
nRBC: 0.2 % (ref 0.0–0.2)
nRBC: 0 % (ref 0.0–0.2)

## 2024-07-03 LAB — BASIC METABOLIC PANEL WITH GFR
Anion gap: 4 — ABNORMAL LOW (ref 5–15)
BUN: 11 mg/dL (ref 8–23)
CO2: 25 mmol/L (ref 22–32)
Calcium: 7.5 mg/dL — ABNORMAL LOW (ref 8.9–10.3)
Chloride: 98 mmol/L (ref 98–111)
Creatinine, Ser: 0.78 mg/dL (ref 0.44–1.00)
GFR, Estimated: >60 mL/min (ref >60)
Glucose, Bld: 136 mg/dL — ABNORMAL HIGH (ref 70–99)
Potassium: 3.6 mmol/L (ref 3.5–5.1)
Sodium: 127 mmol/L — ABNORMAL LOW (ref 135–145)

## 2024-07-03 LAB — PROTIME-INR
INR: 1.4 — ABNORMAL HIGH (ref 0.8–1.2)
Prothrombin Time: 18.1 s — ABNORMAL HIGH (ref 11.4–15.2)

## 2024-07-03 LAB — COOXEMETRY PANEL
Carboxyhemoglobin: 1.7 % — ABNORMAL HIGH (ref 0.5–1.5)
Methemoglobin: <0.7 % (ref 0.0–1.5)
O2 Saturation: 61 %
Total hemoglobin: 9.8 g/dL — ABNORMAL LOW (ref 12.0–16.0)

## 2024-07-03 LAB — PREPARE RBC (CROSSMATCH)

## 2024-07-03 LAB — CORTISOL: Cortisol, Plasma: 27.3 ug/dL

## 2024-07-03 MED ORDER — SODIUM CHLORIDE 0.9% IV SOLUTION: Freq: Once | INTRAVENOUS | Status: DC

## 2024-07-03 MED ORDER — WARFARIN - PHARMACIST DOSING INPATIENT: Freq: Every day | Status: DC

## 2024-07-03 MED ORDER — FUROSEMIDE 10 MG/ML IJ SOLN: 40.0000 mg | Freq: Once | INTRAMUSCULAR | Status: DC

## 2024-07-03 MED ORDER — SODIUM CHLORIDE 0.9 % IV SOLN: INTRAVENOUS | Status: AC | PRN

## 2024-07-03 MED ORDER — WARFARIN SODIUM 5 MG PO TABS
5.0000 mg | ORAL_TABLET | Freq: Once | ORAL | Status: AC
  Administered 2024-07-04: 5 mg via ORAL
  Filled 2024-07-03: qty 1

## 2024-07-03 NOTE — Plan of Care (Signed)

## 2024-07-03 NOTE — Progress Notes (Addendum)
 [facility]  Drain with continued dark old blood. Less tender in area. Says she feels better. Remains on levophed ?   BP (!) 101/47   Pulse 69   Temp 99 F (37.2 C) (Axillary)   Resp (!) 28   Ht 5' 6 (1.676 m)   Wt 87.1 kg   SpO2 92%   BMI 30.99 kg/m  JP with dark old blood  Cultures NGTD  Patient s/p evacuation of hematoma of the left lower abdomen to verify no infection given her levophed  needs.  I do wonder if more blood would help her BP and levophed  needs.   If she goes to [facility], her JP can come out when it is putting out more serous to serosanginous fluid < 50cc a day.  Right now the fluid is still old blood.   I can see her in the office to remove if needed.  [name] Pander, MD [facility] [address] [place], [place] [address] [phone] (office)

## 2024-07-03 NOTE — H&P (Signed)
 NAME:  [name] [name], MRN:  [id number], DOB:  13-Dec-1961, LOS: 7 ADMISSION DATE:  06/25/2024, CONSULTATION DATE:  07/03/2024 REFERRING MD:  Dr. Quentin - EDP , CHIEF COMPLAINT:  Hypotension    History of Present Illness:  [name] [name] is a 63 y.o. female with a PMH significant for antiphospholipid syndrome on Coumadin  HTN, HLD, ischemic cardiomyopathy s/p ERCP, prior CVA, GERD, and substance abuse who initially presented to the ED) after suffering mechanical fall.  Workup reporting all abdominal wall hematoma.  Patient was also seen persistently hypotensive prompting admission to ICU.  By 7/2 patient remained hypotensive and pressor dependent prompting decision to move to [facility] for further ongoing workup and management  Pertinent  Medical History  Antiphospholipid syndrome on Coumadin  HTN, HLD, ischemic cardiomyopathy s/p ERCP, prior CVA, GERD, and substance abuse   Significant Hospital Events: Including procedures, antibiotic start and stop dates in addition to other pertinent events   6/27 presented after suffering a fall at home resulting in abdominal wall hematoma.  7/2 remains pressor dependent prompting transfer to [name] for further workup  Interim History / Subjective:  Seen sitting up in bed in no acute distress  Objective    Blood pressure (!) 116/52, pulse 71, temperature 97.9 F (36.6 C), temperature source Oral, resp. rate 15, height 5' 6 (1.676 m), weight 87.1 kg, SpO2 95%.        Intake/Output Summary (Last 24 hours) at 07/03/2024 1538 Last data filed at 07/03/2024 1313 Gross per 24 hour  Intake 1192.73 ml  Output 2275 ml  Net -1082.27 ml   Filed Weights   06/30/24 0306 07/01/24 0500 07/01/24 0839  Weight: 94.9 kg 94.5 kg 87.1 kg    Examination: General: Pleasant well-appearing middle-aged female lying in bed in no acute distress HEENT: [place]/AT, MM pink/moist, PERRL,  Neuro: Alert and oriented x 3, nonfocal CV: s1s2 regular rate and rhythm, no murmur,  rubs, or gallops,  PULM: Clear to auscultation bilaterally, no increased work of breathing, no added breath sound GI: soft, bowel sounds active in all 4 quadrants, non-tender, non-distended, tolerating oral diet Extremities: warm/dry, no edema  Skin: no rashes or lesions   Resolved problem list   Assessment and Plan  Persistent hypotension - Likely multifactorial but given clinical exam it appears patient is intravascularly dry and has a 5 g drop in hemoglobin indicating patient would benefit from transfusion P: Continue pressors for MAP goal greater than 65 Transfused 2 units PRBC Lasix  in between blood transfusions Hold GDMT If hemodynamics do not improve with transfusion would benefit from arterial line  Abdominal wall hematoma - Underwent exploration with surgery to rule out abscess 7/3, cultures remain negative P: Supportive care  Acute blood loss anemia P: Transfuse as above Hemoglobin goal greater than 7 Monitor for signs of ongoing bleeding  Supratherapeutic INR in the setting of warfarin therapy present on admission -INR 1.4 as of 7/2 P: Recheck INR Restart anticoagulation given the high risk for clotting Monitor JP output   History of CAD with prior MI Chronic systolic CHF Ischemic cardiomyopathy - Echocardiogram 06/27/2024 with EF of 35 to 40% and grade 1 diastolic dysfunction Essential hypertension Hyperlipidemia P: Continuous telemetry  Continue statin Heart health diet with sodium restriction when able  Strict intake and output  Daily weight to assess volume status Daily assessment for need to diurese  Closely monitor renal function  Resume GDMT as able Optimize electrolytes  Hyponatremia P: Trend   Best Practice (right click  and Reselect all SmartList Selections daily)   Diet/type: Regular consistency (see orders) DVT prophylaxis Coumadin  Pressure ulcer(s): N/A GI prophylaxis: PPI Lines: Central line Foley:  N/A Code Status:  full  code Last date of multidisciplinary goals of care discussion: Continue to update patient and family daily   Labs   CBC: Recent Labs  Lab 06/29/24 0418 06/30/24 0411 07/01/24 0411 07/02/24 0409 07/03/24 0455  WBC 16.8* 14.6* 10.7* 9.7 13.0*  NEUTROABS  --  11.9*  --   --   --   HGB 9.8* 10.6* 10.3* 9.6* 9.1*  HCT 28.7* 31.9* 30.7* 28.8* 28.8*  MCV 94.1 94.4 96.5 96.3 97.0  PLT 145* 212 208 206 196    Basic Metabolic Panel: Recent Labs  Lab 06/27/24 0404 06/28/24 0518 06/29/24 0418 06/30/24 0411 07/01/24 0411 07/02/24 0409 07/03/24 0455  NA 127*   < > 129* 131* 130* 131* 127*  K 3.9   < > 3.8 3.6 3.6 3.7 3.6  CL 92*   < > 97* 98 96* 97* 98  CO2 23   < > 25 26 26 26 25   GLUCOSE 113*   < > 110* 120* 114* 91 136*  BUN 18   < > 12 9 9 12 11   CREATININE 1.64*   < > 0.98 0.82 0.81 0.80 0.78  CALCIUM  8.2*   < > 7.9* 8.1* 7.8* 7.7* 7.5*  PHOS 4.1  --   --   --   --   --   --    < > = values in this interval not displayed.   GFR: Estimated Creatinine Clearance: 82.1 mL/min (by C-G formula based on SCr of 0.78 mg/dL). Recent Labs  Lab 06/28/24 1904 06/29/24 0418 06/30/24 0411 07/01/24 0411 07/02/24 0409 07/03/24 0455  PROCALCITON 0.16  --   --   --   --   --   WBC  --    < > 14.6* 10.7* 9.7 13.0*   < > = values in this interval not displayed.    Liver Function Tests: Recent Labs  Lab 06/27/24 0404 06/30/24 0411  AST  --  41  ALT  --  53*  ALKPHOS  --  71  BILITOT  --  0.9  PROT  --  5.8*  ALBUMIN  3.0* 2.5*   No results for input(s): LIPASE, AMYLASE in the last 168 hours. No results for input(s): AMMONIA in the last 168 hours.  ABG    Component Value Date/Time   PHART 7.422 12/18/2020 0958   PCO2ART 46.1 12/18/2020 0958   PO2ART 125 (H) 12/18/2020 0958   HCO3 29.0 (H) 03/05/2021 0801   HCO3 28.9 (H) 03/05/2021 0801   TCO2 24 06/25/2024 2245   ACIDBASEDEF 1.0 12/18/2020 1004   O2SAT 72.0 03/05/2021 0801   O2SAT 73.0 03/05/2021 0801      Coagulation Profile: Recent Labs  Lab 06/27/24 0404 06/28/24 0518 06/30/24 0411  INR 1.4* 1.3* 1.4*    Cardiac Enzymes: No results for input(s): CKTOTAL, CKMB, CKMBINDEX, TROPONINI in the last 168 hours.  HbA1C: HB A1C (BAYER DCA - WAIVED)  Date/Time Value Ref Range Status  12/29/2023 02:40 PM 4.6 (L) 4.8 - 5.6 % Final    Comment:             Prediabetes: 5.7 - 6.4          Diabetes: >6.4          Glycemic control for adults with diabetes: <7.0   05/22/2021 02:04 PM 5.2 <  7.0 % Final    Comment:                                          Diabetic Adult            <7.0                                       Healthy Adult        4.3 - 5.7                                                           (DCCT/NGSP) American Diabetes Association's Summary of Glycemic Recommendations for Adults with Diabetes: Hemoglobin A1c <7.0%. More stringent glycemic goals (A1c <6.0%) may further reduce complications at the cost of increased risk of hypoglycemia.     CBG: Recent Labs  Lab 06/28/24 1122  GLUCAP 111*    Review of Systems:   Please see the history of present illness. All other systems reviewed and are negative    Past Medical History:  She,  has a past medical history of AICD (automatic cardioverter/defibrillator) present, Aneurysm of internal carotid artery, Anti-phospholipid syndrome (HCC), Anticardiolipin antibody positive, Brain aneurysm, Chronic combined systolic (congestive) and diastolic (congestive) heart failure (HCC), Cocaine abuse in remission [facility]), Coronary atherosclerosis of native coronary artery, Essential hypertension, GERD (gastroesophageal reflux disease), Headache, History of pneumonia, ICD (implantable cardioverter-defibrillator) in place, Ischemic cardiomyopathy, Mixed hyperlipidemia, Myocardial infarction [facility]), NSVT (nonsustained ventricular tachycardia) (HCC), Persistent atrial fibrillation (HCC), PVD (peripheral vascular disease) (HCC), Raynaud's  phenomenon, Statin intolerance, and Stroke (HCC) (2007).   Surgical History:   Past Surgical History:  Procedure Laterality Date   ABDOMINAL AORTAGRAM N/A 05/25/2014   Procedure: ABDOMINAL AORTAGRAM;  Surgeon: Deatrice DELENA Cage, MD;  Location: [facility] CATH LAB;  Service: Cardiovascular;  Laterality: N/A;   APPENDECTOMY     BACK SURGERY  2010   Dr. Malcolm L1 cage, 5 disc fusion   CARDIAC DEFIBRILLATOR PLACEMENT  2008   [employer] ICD   CARDIOVERSION N/A 12/01/2020   Procedure: CARDIOVERSION;  Surgeon: Rolan Ezra RAMAN, MD;  Location[facility] ENDOSCOPY;  Service: Cardiovascular;  Laterality: N/A;   CORONARY STENT INTERVENTION N/A 01/08/2021   Procedure: CORONARY STENT INTERVENTION;  Surgeon: Swaziland, Peter M, MD;  Location: [facility] INVASIVE CV LAB;  Service: Cardiovascular;  Laterality: N/A;   EP IMPLANTABLE DEVICE N/A 03/01/2016   Procedure: ICD Generator Changeout;  Surgeon: Danelle LELON Birmingham, MD;  Location: [facility] INVASIVE CV LAB;  Service: Cardiovascular;  Laterality: N/A;   ICD/BIV ICD FUNCTION(DFT) TEST N/A 03/14/2021   Procedure: ICD/BIV ICD FUNCTION (DFT) TEST;  Surgeon: Birmingham Danelle LELON, MD;  Location: [facility] INVASIVE CV LAB;  Service: Cardiovascular;  Laterality: N/A;   INCISION AND DRAINAGE ABSCESS Left 07/01/2024   Procedure: INCISION AND DRAINAGE HEMATOMA LEFT LOWER ABDOMEN;  Surgeon: Kallie [name] BROCKS, MD;  Location: [facility] ORS;  Service: General;  Laterality: Left;   IR ANGIO INTRA EXTRACRAN SEL COM CAROTID INNOMINATE BILAT MOD SED  05/29/2023   IR ANGIO VERTEBRAL SEL SUBCLAVIAN INNOMINATE BILAT MOD SED  05/29/2023   IR RADIOLOGIST EVAL & MGMT  04/16/2023   IR US  GUIDE VASC ACCESS RIGHT  05/29/2023   L1 corpectomy   11/29/2009   Laryngeal polyp excision     OPEN REDUCTION INTERNAL FIXATION (ORIF) DISTAL RADIAL FRACTURE Left 05/11/2024   Procedure: OPEN REDUCTION INTERNAL FIXATION (ORIF) DISTAL RADIUS FRACTURE;  Surgeon: Alyse Agent, MD;  Location: [facility] OR;  Service: Orthopedics;  Laterality: Left;   RIGHT/LEFT  HEART CATH AND CORONARY ANGIOGRAPHY N/A 12/18/2020   Procedure: RIGHT/LEFT HEART CATH AND CORONARY ANGIOGRAPHY;  Surgeon: Cherrie Toribio SAUNDERS, MD;  Location: [facility] INVASIVE CV LAB;  Service: Cardiovascular;  Laterality: N/A;   RIGHT/LEFT HEART CATH AND CORONARY ANGIOGRAPHY N/A 03/05/2021   Procedure: RIGHT/LEFT HEART CATH AND CORONARY ANGIOGRAPHY;  Surgeon: Rolan Ezra RAMAN, MD;  Location: [facility] INVASIVE CV LAB;  Service: Cardiovascular;  Laterality: N/A;   TEE WITHOUT CARDIOVERSION N/A 12/01/2020   Procedure: TRANSESOPHAGEAL ECHOCARDIOGRAM (TEE);  Surgeon: Rolan Ezra RAMAN, MD;  Location: [facility] ENDOSCOPY;  Service: Cardiovascular;  Laterality: N/AMERL LULLA BOOM ABLATION N/A 03/08/2021   Procedure: LULLA BOOM ABLATION;  Surgeon: Cindie Ole DASEN, MD;  Location: [facility] INVASIVE CV LAB;  Service: Cardiovascular;  Laterality: N/A;     Social History:   reports that she quit smoking about 2 years ago. Her smoking use included cigarettes and e-cigarettes. She started smoking about 46 years ago. She has a 10.8 pack-year smoking history. She uses smokeless tobacco. She reports that she does not drink alcohol and does not use drugs.   Family History:  Her family history includes Ankylosing spondylitis in her sister; Heart attack in her brother; Heart disease in her sister; Heart disease (age of onset: 9) in her father; Hyperlipidemia in her father; Hypertension in her father and sister; Stomach cancer (age of onset: 18) in her brother; Uterine cancer in her mother. There is no history of Breast cancer.   Allergies Allergies  Allergen Reactions   Beef-Derived Drug Products Anaphylaxis and Nausea Only    From tick bite   Metoclopramide Hcl Anaphylaxis    Non responsive   Penicillins Anaphylaxis    Shock Immediate rash, facial/tongue/throat swelling, SOB or lightheadedness with hypotension   Pork-Derived Products Anaphylaxis and Nausea Only    From tick bite. Has tolerated heparin /lovenox    Metoclopramide Other (See  Comments)    Unknown    Metoprolol Other (See Comments)    Syncope    Statins Other (See Comments)    Myalgias     Home Medications  Prior to Admission medications   Medication Sig Start Date End Date Taking? Authorizing Provider  amiodarone  (PACERONE ) 200 MG tablet TAKE 1 AND 1/2 TABLETS BY MOUTH MONDAY-THURSDAY. TAKE 2 TABLETS BY MOUTH FRIDAY-SUNDAY Patient taking differently: Take 100-200 mg by mouth See admin instructions. Take 200 mg every day at bedtime, Take 200 mg Friday, Saturday and Sunday morning, Then take 100 mg Monday - Thursday in morning. 11/10/23  Yes Miriam Norris, NP  DULoxetine  (CYMBALTA ) 60 MG capsule Take 1 capsule (60 mg total) by mouth daily. **NEEDS TO BE SEEN BEFORE NEXT REFILL** 06/07/24  Yes Hawks, Christy A, FNP  Evolocumab  (REPATHA ) 140 MG/ML SOSY Inject 140 mg into the skin every 14 (fourteen) days.   Yes [provider]  ezetimibe  (ZETIA ) 10 MG tablet TAKE 1 TABLET BY MOUTH EVERY DAY (patient needs office visit FOR further refills) 05/07/24  Yes Rolan Ezra RAMAN, MD  mexiletine (MEXITIL ) 150 MG capsule TAKE TWO CAPSULES BY MOUTH THREE TIMES DAILY 10/09/23  Yes Waddell Danelle ORN, MD  oxyCODONE -acetaminophen  (  PERCOCET) 10-325 MG tablet Take 1 tablet by mouth every 4 (four) hours as needed for pain.   Yes [provider]  pantoprazole  (PROTONIX ) 40 MG tablet TAKE 1 TABLET BY MOUTH DAILY 03/10/24  Yes McLean, Dalton S, MD  Vericiguat  (VERQUVO ) 5 MG TABS TAKE 1 TABLET BY MOUTH ONCE DAILY 01/07/24  Yes Debera Jayson MATSU, MD  Semaglutide -Weight Management (WEGOVY ) 0.5 MG/0.5ML SOAJ Inject 0.5 mg into the skin once a week.    [provider]     Critical care time:   CRITICAL CARE Performed by: Lakeishia Truluck D. Harris   Total critical care time: 38 minutes  Critical care time was exclusive of separately billable procedures and treating other patients.  Critical care was necessary to treat or prevent imminent or life-threatening  deterioration.  Critical care was time spent personally by me on the following activities: development of treatment plan with patient and/or surrogate as well as nursing, discussions with consultants, evaluation of patient's response to treatment, examination of patient, obtaining history from patient or surrogate, ordering and performing treatments and interventions, ordering and review of laboratory studies, ordering and review of radiographic studies, pulse oximetry and re-evaluation of patient's condition.  Dalonda Simoni D. Harris, NP-C  Pulmonary & Critical Care Personal contact information can be found on Amion  If no contact or response made please call [phone] 07/03/2024, 6:45 PM

## 2024-07-03 NOTE — Consult Note (Signed)
 WOC Nurse Consult Note: see original consult note 06/30/2024; new photo uploaded when transferred to [facility] Reason for Consult: L thumb wound  Wound type: Stage 3 Pressure Injury from medical device (cast)  Pressure Injury POA: Yes Measurement: see nursing flowsheet  Wound bed: appears clean no necrotic tissue noted  Drainage (amount, consistency, odor) per nursing flowsheet  Periwound:callused  Dressing procedure/placement/frequency:current wound care orders remain appropriate; cleanse with Vashe and apply silver hydrofiber ([place] [phone]) to wound bed daily.   There is some concern for exposed tendons at this time.  Would recommend having primary MD assess area and make referral to hand surgeon/ortho as they deem necessary.   Above discussed with bedside nurse. WOC team will not follow Reconsult if further needs arise.   Thank you,    Powell Bar MSN, RN-BC, [employer]

## 2024-07-03 NOTE — Progress Notes (Addendum)
 PHARMACY - ANTICOAGULATION CONSULT NOTE  Pharmacy Consult for warfarin Indication: VTE  Allergies  Allergen Reactions   Beef-Derived Drug Products Anaphylaxis and Nausea Only    From tick bite   Metoclopramide Hcl Anaphylaxis    Non responsive   Penicillins Anaphylaxis    Shock Immediate rash, facial/tongue/throat swelling, SOB or lightheadedness with hypotension   Pork-Derived Products Anaphylaxis and Nausea Only    From tick bite. Has tolerated heparin /lovenox    Metoclopramide Other (See Comments)    Unknown    Metoprolol Other (See Comments)    Syncope    Statins Other (See Comments)    Myalgias    Patient Measurements: Height: 5' 6 (167.6 cm) Weight: 87.1 kg (192 lb) IBW/kg (Calculated) : 59.3 HEPARIN  DW (KG): 78  Vital Signs: Temp: 97.9 F (36.6 C) (07/05 1309) Temp Source: Oral (07/05 1309) BP: 116/52 (07/05 1500) Pulse Rate: 71 (07/05 1500)  Labs: Recent Labs    07/01/24 0411 07/02/24 0409 07/03/24 0455  HGB 10.3* 9.6* 9.1*  HCT 30.7* 28.8* 28.8*  PLT 208 206 196  CREATININE 0.81 0.80 0.78    Estimated Creatinine Clearance: 82.1 mL/min (by C-G formula based on SCr of 0.78 mg/dL).   Medical History: Past Medical History:  Diagnosis Date   AICD (automatic cardioverter/defibrillator) present    Aneurysm of internal carotid artery    Anti-phospholipid syndrome (HCC)    Anticardiolipin antibody positive    Chronic Coumadin    Brain aneurysm    followed by Dr. Dolphus   Chronic combined systolic (congestive) and diastolic (congestive) heart failure (HCC)    Cocaine abuse in remission [facility])    Coronary atherosclerosis of native coronary artery    Previous invasive cardiac testing was done at a facility in [place], [place].  Occluded diagonal, Diffuse LAD disease, Occluded Cx, and non obstructive RCA.    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 Pharmacy to resume warfarin.   INR today is 1.4 after reversal with IV vitamin K  earlier this admit. Home regimen is 5mg  MWF, 2.5mg  TTSS.  Goal of Therapy:  INR 2-3 Monitor platelets by anticoagulation protocol: Yes   Plan:  Warfarin 5mg  PO x1 tonight Daily INR  Ozell Jamaica, PharmD, BCPS, [facility] Clinical Pharmacist [phone] Please check AMION for all [facility] numbers 07/03/2024

## 2024-07-03 NOTE — Progress Notes (Signed)
 PROGRESS NOTE   [name] [name]  FMW:[id number] DOB: [birth year] DOA: 06/25/2024 PCP: Lavell Bari LABOR, FNP   Chief Complaint  Patient presents with   Fall   Level of care: ICU  Brief Admission History:  63 y.o. female with medical history significant of antiphospholipid syndrome on warfarin, CHF, A-fib who presents to the emergency department after sustaining a fall and had an abdominal wall injury.  Patient states that she sustained a fall yesterday in the afternoon around 2 PM when her walker hit something resulting in her losing balance and fell landing on walker and striking left side of her abdomen with subsequent bruise to the area.  She denies hitting head or losing consciousness.  She decided to go to the ED for evaluation due to being on warfarin and being concerned of internal bleeding.  Patient also complained of nausea and vomiting since she recently started Wegovy .  She had difficulty in being able to keep any food down.   ED Course:  In the emergency department, BP was in hypotensive range at 67/57 (patient endorsed chronic soft BP with SBP's in 80s to 90s).  Other vital signs were within normal range.  Workup in the ED showed normal CBC except for WBC of 11.9.  BMP showed sodium 134, potassium 4.4, chloride 95, bicarb 23, blood glucose 126, BUN 18, creatinine 2.05, albumin  3.3, AST 23, ALT 47, GFR 27, anion gap 16, INR 6.3 CT chest, abdomen and pelvis with contrast showed Soft tissue contusion and large hematoma in the anterolateral left abdominal wall measuring 9.4 x 6.7 x 17.5 cm and 2.7 x 2.3 cm fluid collection in the lateral left thigh with mild peripheral enhancement.  No evidence of acute traumatic injury within the chest.  Trauma surgeon on-call (Dr. Ann) was consulted and recommended that patient can stay AP since there is no indication for any surgical intervention.  Abdominal binding, warfarin reversal and monitoring of H/H was recommended per EDP.  IV hydration was  provided, Percocet was given, vitamin K  was given.   Assessment and Plan:  Persistent Hypotension--suspect this is multifactorial given hypovolemia and acute blood loss (hemorrhagic shock) in the setting of abdominal wall hematoma due to fall at home PTA in a patient who presented with supratherapeutic INR --Currently requiring IV Levophed  for pressure support -- we are diligently trying to get her weaned off  --Holding PTA Coreg , Aldactone  and isosorbide  due to hypotension --Increasing midodrine  15 mg 3 times daily for pressure support --Rule out infection FROM 06/28/24----CT chest abdomen and pelvis without infectious finding - surgery planning to explore on 7/3.  -Get blood cultures--NGTD -Check UA-Not suggestive of UTI -Check PCT- 0.16 --check a.m. cortisol--11.4, repeated 27.3 -OR 7/3 to test and rule out superimposed intra-abdominal infection on patient's hematoma -WBC 12.8 >>14.7 >>16.8>14.6  PCT 0.16 - empirically started on IV CTX and IV Flagyl  after obtaining blood cultures to cover for possible infected intra-abdominal hematoma, cultures negative so far and discontinued antibiotics 7/4    Abdominal wall Hematoma--- noticed post fall at home in the setting of supratherapeutic INR -Okay to use abdominal binder if that helps her discomfort from bruising and hematoma -Repeat CT AP with contrast done on 06/28/2024 due to persistent hypotension requiring high-dose Levophed  despite IV fluids and due to Hgb drifting down-- -CTA AP with oral and IV contrast on 06/28/2024 shows--Persistent abdominal and left lateral thigh hematomas. No findings to suggest active hemorrhage are identified. -- General Surgery consult appreciated--s/p hematoma drain and culture on 7/3  --  Hgb improved to 10.6 after transfusion of 1 unit of PRBC   -- I communicated with her cardiologist Dr. Rolan who agreed with exploring the hematoma to try to get her off pressors --considered transfusing more units of PRBC  however pt wants her cardiology team to be involved before further transfusions and she is requesting transfer to [facility]  -- Dr. Kallie noted that JP drain can be removed when output becomes serosanguinous   Acute blood loss anemia due to above--with hemorrhagic shock Hgb 14.3 >>13.5 >>10.3 >>9.1 --Suspect slight component of hemodilution due to recent IV fluids - Monitor H&H and transfuse as clinically indicated - following CBC   Supratherapeutic INR--in the setting of warfarin therapy for Antiphospholipid syndrome INR 6.3 >>3.4>> 1.4 >>1.3 -INR normalized after vitamin K  x 2 doses --reports poor oral intake over the last few weeks due to Wegovy  therapy--poor oral  intake probably exacerbated her supratherapeutic INR associated with lack of vitamin K  intake   Dysphagia -pt reports severe pain with swallowing and history of esophagitis -pt continues to have very poor oral intake and recurrent emesis -requested GI input   Fall at home Continue fall precaution Continue PT/OT eval and treat   GERD Continue Protonix  BID as ordered    Chronic systolic CHF -Repeat echo on 06/27/2024 with EF up to 35 to 40% from 30-35 % back in June 2025 Continue to hold Coreg , Entresto , isosorbide  spironolactone  due to soft BP --Currently appears intravascularly volume depleted due to acute blood loss with hemorrhagic shock   Mixed hyperlipidemia Continue Zetia  Statin will be temporarily held due to mild transaminitis She also takes Repatha  every 14 days   Mild Acute HypoNatremia--- multifactorial -A.m. cortisol is 11.4 (WNL) -TSH WNL -follow   DVT prophylaxis: SCDs Code Status: Full  Family Communication: t/c to nephew on 7/2, 7/4, bedside update 7/5  Disposition: TBD   Consultants:  Surgery  Pulmonary critical care  Procedures:   Antimicrobials:    Subjective: Pt adamant she wants to transfer to [facility], she says that she wants to get off pressors and start some physical  therapy  Objective: Vitals:   07/03/24 0533 07/03/24 0615 07/03/24 0657 07/03/24 0728  BP: (!) 109/49 102/83 (!) 101/47   Pulse: 68 67 69   Resp: 19 (!) 9 (!) 28   Temp:    99 F (37.2 C)  TempSrc:    Axillary  SpO2: 95% 96% 92%   Weight:      Height:        Intake/Output Summary (Last 24 hours) at 07/03/2024 1301 Last data filed at 07/03/2024 1235 Gross per 24 hour  Intake 1970.73 ml  Output 1520 ml  Net 450.73 ml   Filed Weights   06/30/24 0306 07/01/24 0500 07/01/24 0839  Weight: 94.9 kg 94.5 kg 87.1 kg   Examination:  General exam: Appears calm and comfortable  Respiratory system: Clear to auscultation. Respiratory effort normal. Cardiovascular system: normal S1 & S2 heard. No JVD, murmurs, rubs, gallops or clicks. No pedal edema. Gastrointestinal system: Abdomen is soft.  JP drain in left abd with blood output seen. No organomegaly or masses felt. Normal bowel sounds heard. Central nervous system: Alert and oriented. No focal neurological deficits. Extremities: Symmetric 5 x 5 power. Skin: large abdominal and thigh hematoma noted. Psychiatry: Judgement and insight appear normal. Mood & affect appropriate.   Data Reviewed: I have personally reviewed following labs and imaging studies  CBC: Recent Labs  Lab 06/29/24 0418 06/30/24 0411 07/01/24 0411  07/02/24 0409 07/03/24 0455  WBC 16.8* 14.6* 10.7* 9.7 13.0*  NEUTROABS  --  11.9*  --   --   --   HGB 9.8* 10.6* 10.3* 9.6* 9.1*  HCT 28.7* 31.9* 30.7* 28.8* 28.8*  MCV 94.1 94.4 96.5 96.3 97.0  PLT 145* 212 208 206 196    Basic Metabolic Panel: Recent Labs  Lab 06/27/24 0404 06/28/24 0518 06/29/24 0418 06/30/24 0411 07/01/24 0411 07/02/24 0409 07/03/24 0455  NA 127*   < > 129* 131* 130* 131* 127*  K 3.9   < > 3.8 3.6 3.6 3.7 3.6  CL 92*   < > 97* 98 96* 97* 98  CO2 23   < > 25 26 26 26 25   GLUCOSE 113*   < > 110* 120* 114* 91 136*  BUN 18   < > 12 9 9 12 11   CREATININE 1.64*   < > 0.98 0.82 0.81 0.80  0.78  CALCIUM  8.2*   < > 7.9* 8.1* 7.8* 7.7* 7.5*  PHOS 4.1  --   --   --   --   --   --    < > = values in this interval not displayed.    CBG: Recent Labs  Lab 06/28/24 1122  GLUCAP 111*    Recent Results (from the past 240 hours)  MRSA Next Gen by PCR, Nasal     Status: None   Collection Time: 06/26/24  2:46 AM   Specimen: Nasal Mucosa; Nasal Swab  Result Value Ref Range Status   MRSA by PCR Next Gen NOT DETECTED NOT DETECTED Final    Comment: (NOTE) The GeneXpert MRSA Assay (FDA approved for NASAL specimens only), is one component of a comprehensive MRSA colonization surveillance program. It is not intended to diagnose MRSA infection nor to guide or monitor treatment for MRSA infections. Test performance is not FDA approved in patients less than 61 years old. Performed at [facility], [address] [place], [place] [address]   Culture, blood (Routine X 2) w Reflex to ID Panel     Status: None   Collection Time: 06/28/24  7:04 PM   Specimen: BLOOD  Result Value Ref Range Status   Specimen Description BLOOD BLOOD RIGHT HAND  Final   Special Requests   Final    BOTTLES DRAWN AEROBIC ONLY Blood Culture results may not be optimal due to an inadequate volume of blood received in culture bottles   Culture   Final    NO GROWTH 5 DAYS Performed at [facility], [address] [place], [place] [address]    Report Status 07/03/2024 FINAL  Final  Culture, blood (Routine X 2) w Reflex to ID Panel     Status: None   Collection Time: 06/28/24  8:05 PM   Specimen: BLOOD  Result Value Ref Range Status   Specimen Description BLOOD BLOOD RIGHT HAND  Final   Special Requests   Final    BOTTLES DRAWN AEROBIC ONLY Blood Culture results may not be optimal due to an inadequate volume of blood received in culture bottles   Culture   Final    NO GROWTH 5 DAYS Performed at [facility], [address] [place], [place] [address]    Report Status 07/03/2024 FINAL  Final  Aerobic/Anaerobic  Culture w Gram Stain (surgical/deep wound)     Status: None (Preliminary result)   Collection Time: 07/01/24 11:52 AM   Specimen: Abdomen; Abscess  Result Value Ref Range Status  Specimen Description   Final    WOUND Performed at [facility], [address] [place], [place] [address]    Special Requests LEFT ABD WALL ABSC VS HEMATOMA  Final   Gram Stain NO WBC SEEN NO ORGANISMS SEEN   Final   Culture   Final    NO GROWTH 2 DAYS Performed at [facility], [address] [place], [place] [address]    Report Status PENDING  Incomplete     Radiology Studies: No results found.  Scheduled Meds:  amiodarone   100 mg Oral Once per day on Monday Tuesday Wednesday Thursday   amiodarone   200 mg Oral Once per day on Monday Tuesday Wednesday Thursday   amiodarone   200 mg Oral 2 times per day on Sunday Friday Saturday   Chlorhexidine  Gluconate Cloth  6 each Topical Q0600   DULoxetine   60 mg Oral Daily   ezetimibe   10 mg Oral Daily   feeding supplement  237 mL Oral BID BM   mexiletine  300 mg Oral TID   midodrine   15 mg Oral TID WC   pantoprazole   40 mg Oral BID   polyethylene glycol  17 g Oral BID   Vericiguat   1 tablet Oral Daily   Continuous Infusions:  sodium chloride      norepinephrine  (LEVOPHED ) Adult infusion 11 mcg/min (07/03/24 0656)     LOS: 7 days   Critical Care Procedure Note Authorized and Performed by: KYM Louder MD  Total Critical Care time: 60 mins  Due to a high probability of clinically significant, life threatening deterioration, the patient required my highest level of preparedness to intervene emergently and I personally spent this critical care time directly and personally managing the patient.  This critical care time included obtaining a history; examining the patient, pulse oximetry; ordering and review of studies; arranging urgent treatment with development of a management plan; evaluation of patient's response of treatment; frequent reassessment; and  discussions with other providers.  This critical care time was performed to assess and manage the high probability of imminent and life threatening deterioration that could result in multi-organ failure.  It was exclusive of separately billable procedures and treating other patients and teaching time.   Afton Louder, MD How to contact the [facility] Attending or Consulting provider 7A - 7P or covering provider during after hours 7P -7A, for this patient?  Check the care team in [facility] and look for a) attending/consulting [facility] provider listed and b) the [facility] team listed Log into [email or web address] to find provider on call.  Locate the [facility] provider you are looking for under [facility] and page to a number that you can be directly reached. If you still have difficulty reaching the provider, please page the [facility] (Director on Call) for the Hospitalists listed on amion for assistance.  07/03/2024, 1:01 PM

## 2024-07-03 NOTE — Procedures (Signed)
 Procedure Note  07/03/24    Preoperative Diagnosis: Hypotension, pressor use    Postoperative Diagnosis: Same   Procedure(s) Performed: Ultrasound Guided Arterial line attempt right radial artery - ABORTED AFTER INABILITY TO GET FLASH OR THREAD CATHETER   Surgeon: [name] C. Kallie, MD   Assistants: None   Anesthesia: None    Complications: None    Indications: [name] [name] is a 63 y.o. with continued need for pressors. I discussed the risk and benefits of placement of the arterial line with her, including but not limited to bleeding, infection, and risk of injury to the artery. She has given Consent for the procedure.  The respiratory therapist had at tempted arterial line in the same arm without success. She has a cast on the left arm.   Procedure: The patient placed supine. The right arm and wrist were placed with a roll under her wrist for extension. The wrist was prepped and an US  was used to identify a very small artery. The wrist was prepped and draped in the usual sterile fashion.  Wearing sterile gloves, I performed the procedure.  I could see my needle in the artery lumen but I could never get more than  a small flash. Once I attempted to thread the wire with the needle in the lumen, and was able to get some blood back into the catheter, but could not rewire it in place.  I attempted more proximally on her wrist with the same results.  Given the lack of flash and the small vessel, we aborted the procedure.  Dr. Vicci did not think a femoral line would be necessary and the patient refused the femoral line.   The patient tolerated the procedure well and a dressing was placed over the sticks.   [name] Kallie, MD [facility] [address] [place], [place] [address] [phone] (office)

## 2024-07-03 NOTE — Progress Notes (Signed)
 eLink Physician-Brief Progress Note Patient Name: [name] [name] DOB: [birth year] MRN: [id number]   Date of Service  07/03/2024  HPI/Events of Note  Patient has an order for Red cell transfusion with furosemide .  Hemoglobin from this morning is 9.1.  Norepinephrine  requirements are decreasing and is down to 4 from 12 mcg/min.  Patient is hemodynamically stable with a MAP of 77.  RN wants clarification on transfusion ordered.  eICU Interventions  Will hold off on transfusion for now.  RN has already sent a repeat CBC and any further decisions will be based on that.  Furosemide  will also be held if blood is not getting transfused tonight. Discussed with RN.     Intervention Category Minor Interventions: Other: (Order clarification)  Jerilynn Berg 07/03/2024, 9:17 PM

## 2024-07-04 ENCOUNTER — Inpatient Hospital Stay (HOSPITAL_COMMUNITY)

## 2024-07-04 DIAGNOSIS — D689 Coagulation defect, unspecified: Secondary | ICD-10-CM | POA: Diagnosis not present

## 2024-07-04 DIAGNOSIS — I959 Hypotension, unspecified: Secondary | ICD-10-CM | POA: Diagnosis not present

## 2024-07-04 DIAGNOSIS — I5042 Chronic combined systolic (congestive) and diastolic (congestive) heart failure: Secondary | ICD-10-CM

## 2024-07-04 DIAGNOSIS — I480 Paroxysmal atrial fibrillation: Secondary | ICD-10-CM

## 2024-07-04 DIAGNOSIS — S301XXA Contusion of abdominal wall, initial encounter: Secondary | ICD-10-CM | POA: Diagnosis not present

## 2024-07-04 DIAGNOSIS — D62 Acute posthemorrhagic anemia: Secondary | ICD-10-CM | POA: Diagnosis not present

## 2024-07-04 LAB — CBC
HCT: 27.7 % — ABNORMAL LOW (ref 36.0–46.0)
Hemoglobin: 8.8 g/dL — ABNORMAL LOW (ref 12.0–15.0)
MCH: 31.2 pg (ref 26.0–34.0)
MCHC: 31.8 g/dL (ref 30.0–36.0)
MCV: 98.2 fL (ref 80.0–100.0)
Platelets: 208 K/uL (ref 150–400)
RBC: 2.82 MIL/uL — ABNORMAL LOW (ref 3.87–5.11)
RDW: 14.6 % (ref 11.5–15.5)
WBC: 10.1 K/uL (ref 4.0–10.5)
nRBC: 0 % (ref 0.0–0.2)

## 2024-07-04 LAB — BASIC METABOLIC PANEL WITH GFR
Anion gap: 7 (ref 5–15)
BUN: 7 mg/dL — ABNORMAL LOW (ref 8–23)
CO2: 24 mmol/L (ref 22–32)
Calcium: 7.8 mg/dL — ABNORMAL LOW (ref 8.9–10.3)
Chloride: 100 mmol/L (ref 98–111)
Creatinine, Ser: 0.78 mg/dL (ref 0.44–1.00)
GFR, Estimated: >60 mL/min (ref >60)
Glucose, Bld: 85 mg/dL (ref 70–99)
Potassium: 3.9 mmol/L (ref 3.5–5.1)
Sodium: 131 mmol/L — ABNORMAL LOW (ref 135–145)

## 2024-07-04 LAB — PROTIME-INR
INR: 1.4 — ABNORMAL HIGH (ref 0.8–1.2)
Prothrombin Time: 18.2 s — ABNORMAL HIGH (ref 11.4–15.2)

## 2024-07-04 MED ORDER — FUROSEMIDE 10 MG/ML IJ SOLN
40.0000 mg | Freq: Once | INTRAMUSCULAR | Status: AC
  Administered 2024-07-04: 40 mg via INTRAVENOUS
  Filled 2024-07-04: qty 4

## 2024-07-04 MED ORDER — WARFARIN SODIUM 2.5 MG PO TABS
2.5000 mg | ORAL_TABLET | Freq: Once | ORAL | Status: AC
  Administered 2024-07-04: 2.5 mg via ORAL
  Filled 2024-07-04: qty 1

## 2024-07-04 NOTE — Plan of Care (Signed)

## 2024-07-04 NOTE — H&P (Signed)
 NAME:  [name] [name], MRN:  [id number], DOB:  10-03-1961, LOS: 8 ADMISSION DATE:  06/25/2024, CONSULTATION DATE:  07/03/2024 REFERRING MD:  Dr. Quentin - EDP , CHIEF COMPLAINT:  Hypotension    History of Present Illness:  [name] [name] is a 63 y.o. female with a PMH significant for antiphospholipid syndrome on Coumadin  HTN, HLD, ischemic cardiomyopathy s/p ERCP, prior CVA, GERD, and substance abuse who initially presented to the ED) after suffering mechanical fall.  Workup reporting all abdominal wall hematoma.  Patient was also seen persistently hypotensive prompting admission to ICU.  By 7/5 patient remained hypotensive and pressor dependent prompting decision to move to [name] for further ongoing workup and management  Pertinent  Medical History   Past Medical History:  Diagnosis Date   AICD (automatic cardioverter/defibrillator) present    Aneurysm of internal carotid artery    Anti-phospholipid syndrome (HCC)    Anticardiolipin antibody positive    Chronic Coumadin    Brain aneurysm    followed by Dr. Dolphus   Chronic combined systolic (congestive) and diastolic (congestive) heart failure (HCC)    Cocaine abuse in remission [facility])    Coronary atherosclerosis of native coronary artery    Previous invasive cardiac testing was done at a facility in [place], [place].  Occluded diagonal, Diffuse LAD disease, Occluded Cx, and non obstructive RCA.    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  7/5 remains pressor dependent prompting transfer to [name] for further workup  Interim History / Subjective:  No overnight issues Vasopressor requirement improved from 12 mics now down to 1 mic of Levophed  Stated breathing is better, continue to complain of shortness on left side of abdominal wall and left hip where she had a fall and hematoma Remain afebrile  Objective    Blood pressure (!) 114/54, pulse 67, temperature (!) 97.5 F (36.4 C), temperature source Oral, resp. rate 12, height 5' 6 (1.676 m), weight 87.1 kg, SpO2 95%.        Intake/Output Summary (Last 24 hours) at 07/04/2024 0828 Last data filed at 07/04/2024 0700 Gross per 24 hour  Intake 841.38 ml  Output 2645 ml  Net -1803.62 ml   Filed Weights   06/30/24 0306 07/01/24 0500 07/01/24 0839  Weight: 94.9 kg 94.5 kg 87.1 kg    Examination: General: Middle-aged obese female, lying on the bed HEENT: [place]/AT, eyes anicteric.  moist mucus membranes Neuro: Alert, awake following commands Chest: Reduced air entry at the bases bilaterally no wheezes or rhonchi Heart: Regular rate and rhythm, no murmurs or gallops Abdomen: Soft, nontender, nondistended, bowel sounds present Skin: Large ecchymosis noted on left side of the abdominal wall, left hip and left thigh, JP drain in place draining dark blood Extremities: Plaster cast is placed  on left upper extremity  Labs and images reviewed  Patient Lines/Drains/Airways Status     Active Line/Drains/Airways     Name Placement date Placement time Site Days   CVC Triple Lumen 06/28/24 Right Internal jugular 06/28/24  1900  -- 6   Closed System Drain 1 Left LLQ Bulb (JP) 07/01/24  1154  LLQ  3   Urethral Catheter 06/29/24  1239  --  5   Wound 06/29/24 2229 Pressure Injury Finger (Comment  which one) Anterior;Left Stage 3 -  Full thickness tissue loss. Subcutaneous fat may be visible but bone, tendon or muscle are NOT exposed. 06/29/24  2229  Finger (Comment which one)  5   Wound 07/01/24 1157 Surgical Closed Surgical Incision Abdomen Left 07/01/24  1157  Abdomen  3         Resolved problem list   Assessment and Plan  Abdominal wall hematoma status post fall Acute blood loss anemia Underwent exploration with surgery to rule out abscess 7/3, cultures remain negative She does have large bruise on left side of abdomen, left hip region and left thigh JP drain in place, monitor output General Surgery to follow Monitor H&H and transfuse if less than 7  Supratherapeutic INR in the setting of warfarin therapy present on admission Patient presented with INR of 6.3 Anticoagulation kept on hold Now INR is 1.4 Coumadin  was restarted, watch for signs of bleeding  Persistent hypotension Patient has been pressors dependent since admission, multifactorial including from acute blood loss anemia, HFrEF and poor oral intake as she has been on clear liquid diet for days H&H is stable now She received Lasix  40 mg yesterday, diuresed well, net -1.8 L Vasopressor requirement improved from 12 mics down to 1 mic this morning Continue midodrine  15 mg 3 times daily for now Change SBP goal to 90-100 Start regular diet Hold GDMT  CAD with prior MI Chronic combined HFrEF and HFpEF status post AICD Echocardiogram 06/27/2024 with EF of 35 to 40% and grade 1 diastolic dysfunction Essential hypertension Hyperlipidemia Paroxysmal A-fib Interestingly she does not take antiplatelets at home even though she had MI before She follows cardiology with Dr. Rolan Continue Zetia  Continuous telemetry  Remain in sinus rhythm Hold GDMT, considering she is requiring vasopressor support Continue amiodarone   Antiphospholipid antibody syndrome Restarted back on Coumadin   Hyponatremia,  hypervolemic Serum sodium improved with the diuretics from 127 now to 131 Will give Lasix  40 mg today, assess diuretic needs every day  Left distal radius fracture Patient has hard cast Will call orthopedic on Monday to evaluate if she can come off of this hard cast as it has been rubbing left thumb causing deep ulceration and pressure injury  Best Practice (right click and Reselect all SmartList Selections daily)   Diet/type: Regular consistency (see orders) DVT prophylaxis Coumadin  Pressure ulcer(s): Patient has stage III ulcer on left thumb due to rubbing from the cast, POA GI prophylaxis: PPI Lines: Central line Foley:  N/A Code Status:  full code Last date of multidisciplinary goals of care discussion: 7/6 patient was updated at bedside, decision was to continue full scope of care  Labs   CBC: Recent Labs  Lab 06/30/24 0411 07/01/24 0411 07/02/24 0409 07/03/24 0455 07/03/24 2052 07/04/24 0605  WBC 14.6* 10.7* 9.7 13.0* 10.4 10.1  NEUTROABS 11.9*  --   --   --   --   --   HGB 10.6* 10.3* 9.6* 9.1* 9.1* 8.8*  HCT 31.9* 30.7* 28.8* 28.8* 28.1* 27.7*  MCV 94.4 96.5 96.3 97.0 96.9 98.2  PLT 212 208 206 196 233 208    Basic Metabolic Panel: Recent Labs  Lab 06/30/24 0411 07/01/24 0411 07/02/24 0409 07/03/24 0455 07/04/24 0605  NA 131* 130* 131* 127* 131*  K 3.6 3.6 3.7 3.6 3.9  CL 98 96* 97* 98 100  CO2 26 26 26 25 24   GLUCOSE 120* 114* 91 136* 85  BUN 9 9 12 11  7*  CREATININE 0.82 0.81 0.80 0.78 0.78  CALCIUM  8.1* 7.8* 7.7* 7.5* 7.8*   GFR: Estimated Creatinine Clearance: 82.1 mL/min (by C-G formula based on SCr of 0.78 mg/dL). Recent Labs  Lab 06/28/24 1904 06/29/24 0418 07/02/24 0409 07/03/24 0455 07/03/24 2052 07/04/24 0605  PROCALCITON 0.16  --   --   --   --   --   WBC  --    < > 9.7 13.0* 10.4 10.1   < > = values in this interval not displayed.    Liver Function Tests: Recent Labs  Lab 06/30/24 0411  AST 41  ALT 53*  ALKPHOS 71   BILITOT 0.9  PROT 5.8*  ALBUMIN  2.5*   No results for input(s): LIPASE, AMYLASE in the last 168 hours. No results for input(s): AMMONIA in the last 168 hours.  ABG    Component Value Date/Time   PHART 7.422 12/18/2020 0958   PCO2ART 46.1 12/18/2020 0958   PO2ART 125 (H) 12/18/2020 0958   HCO3 29.0 (H) 03/05/2021 0801   HCO3 28.9 (H) 03/05/2021 0801   TCO2 24 06/25/2024 2245   ACIDBASEDEF 1.0 12/18/2020 1004   O2SAT 61 07/03/2024 1905     Coagulation Profile: Recent Labs  Lab 06/28/24 0518 06/30/24 0411 07/03/24 1905 07/04/24 0605  INR 1.3* 1.4* 1.4* 1.4*    Cardiac Enzymes: No results for input(s): CKTOTAL, CKMB, CKMBINDEX, TROPONINI in the last 168 hours.  HbA1C: HB A1C (BAYER DCA - WAIVED)  Date/Time Value Ref Range Status  12/29/2023 02:40 PM 4.6 (L) 4.8 - 5.6 % Final    Comment:             Prediabetes: 5.7 - 6.4          Diabetes: >6.4          Glycemic control for adults with diabetes: <7.0   05/22/2021 02:04 PM 5.2 <7.0 % Final    Comment:                                          Diabetic Adult            <7.0                                       Healthy Adult        4.3 - 5.7                                                           (DCCT/NGSP) American Diabetes Association's Summary of Glycemic Recommendations for Adults with Diabetes: Hemoglobin A1c <7.0%. More stringent glycemic goals (A1c <6.0%) may further reduce complications at the cost of increased risk  of hypoglycemia.     CBG: Recent Labs  Lab 06/28/24 1122  GLUCAP 111*    The patient is critically ill due to status post fall with abdominal wall hematoma, persistent hypotension requiring titration of vasopressors.  Critical care was necessary to treat or prevent imminent or life-threatening deterioration.  Critical care was time spent personally by me on the following activities: development of treatment plan with patient and/or surrogate as well as nursing, discussions  with consultants, evaluation of patient's response to treatment, examination of patient, obtaining history from patient or surrogate, ordering and performing treatments and interventions, ordering and review of laboratory studies, ordering and review of radiographic studies, pulse oximetry, re-evaluation of patient's condition and participation in multidisciplinary rounds.   During this encounter critical care time was devoted to patient care services described in this note for 36 minutes.     Valinda Novas, MD [facility] See Amion for pager If no response to pager, please call [phone] until 7pm After 7pm, Please call E-link [phone]

## 2024-07-04 NOTE — Progress Notes (Signed)
 PHARMACY - ANTICOAGULATION CONSULT NOTE  Pharmacy Consult for warfarin Indication: VTE  Allergies  Allergen Reactions   Beef-Derived Drug Products Anaphylaxis and Nausea Only    From tick bite   Metoclopramide Hcl Anaphylaxis    Non responsive   Penicillins Anaphylaxis    Shock Immediate rash, facial/tongue/throat swelling, SOB or lightheadedness with hypotension   Pork-Derived Products Anaphylaxis and Nausea Only    From tick bite. Has tolerated heparin /lovenox    Metoclopramide Other (See Comments)    Unknown    Metoprolol Other (See Comments)    Syncope    Statins Other (See Comments)    Myalgias    Patient Measurements: Height: 5' 6 (167.6 cm) Weight: 87.1 kg (192 lb) IBW/kg (Calculated) : 59.3 HEPARIN  DW (KG): 78  Vital Signs: Temp: 98.4 F (36.9 C) (07/06 1139) Temp Source: Oral (07/06 1139) BP: 103/64 (07/06 1259) Pulse Rate: 74 (07/06 1259)  Labs: Recent Labs    07/02/24 0409 07/03/24 0455 07/03/24 1905 07/03/24 2052 07/04/24 0605  HGB 9.6* 9.1*  --  9.1* 8.8*  HCT 28.8* 28.8*  --  28.1* 27.7*  PLT 206 196  --  233 208  LABPROT  --   --  18.1*  --  18.2*  INR  --   --  1.4*  --  1.4*  CREATININE 0.80 0.78  --   --  0.78    Estimated Creatinine Clearance: 82.1 mL/min (by C-G formula based on SCr of 0.78 mg/dL).   Medical History: Past Medical History:  Diagnosis Date   AICD (automatic cardioverter/defibrillator) present    Aneurysm of internal carotid artery    Anti-phospholipid syndrome (HCC)    Anticardiolipin antibody positive    Chronic Coumadin    Brain aneurysm    followed by Dr. Dolphus   Chronic combined systolic (congestive) and diastolic (congestive) heart failure (HCC)    Cocaine abuse in remission [facility])    Coronary atherosclerosis of native coronary artery    Previous invasive cardiac testing was done at a facility in [place], [place].  Occluded diagonal, Diffuse LAD disease, Occluded Cx, and non obstructive RCA.     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 Pharmacy to resume warfarin.   INR today is 1.4 after reversal with IV vitamin K  earlier this admit. Home regimen is 5mg  MWF, 2.5mg  TTSS.  INR today remains subtherapeutic at 1.4 after warfarin restarted on 7/5. Hgb 8.8, plt 208. No s/sx of bleeding.   Goal of Therapy:  INR 2-3 Monitor platelets by anticoagulation protocol: Yes   Plan:  Warfarin 2.5 mg PO x1 tonight Daily INR  Thank you for allowing pharmacy to participate in this patient's care,  Suzen Sour, PharmD, BCCCP Clinical Pharmacist  Phone: [phone] 07/04/2024 1:59 PM  Please check AMION for all [facility] Pharmacy phone numbers After 10:00 PM, call Main Pharmacy [phone]

## 2024-07-05 DIAGNOSIS — D62 Acute posthemorrhagic anemia: Secondary | ICD-10-CM | POA: Diagnosis not present

## 2024-07-05 DIAGNOSIS — W19XXXS Unspecified fall, sequela: Secondary | ICD-10-CM

## 2024-07-05 DIAGNOSIS — S301XXD Contusion of abdominal wall, subsequent encounter: Secondary | ICD-10-CM | POA: Diagnosis not present

## 2024-07-05 DIAGNOSIS — I959 Hypotension, unspecified: Secondary | ICD-10-CM | POA: Diagnosis not present

## 2024-07-05 LAB — PROTIME-INR
INR: 1.7 — ABNORMAL HIGH (ref 0.8–1.2)
Prothrombin Time: 20.6 s — ABNORMAL HIGH (ref 11.4–15.2)

## 2024-07-05 LAB — BASIC METABOLIC PANEL WITH GFR
Anion gap: 7 (ref 5–15)
BUN: 9 mg/dL (ref 8–23)
CO2: 27 mmol/L (ref 22–32)
Calcium: 7.8 mg/dL — ABNORMAL LOW (ref 8.9–10.3)
Chloride: 98 mmol/L (ref 98–111)
Creatinine, Ser: 0.91 mg/dL (ref 0.44–1.00)
GFR, Estimated: >60 mL/min (ref >60)
Glucose, Bld: 83 mg/dL (ref 70–99)
Potassium: 3.4 mmol/L — ABNORMAL LOW (ref 3.5–5.1)
Sodium: 132 mmol/L — ABNORMAL LOW (ref 135–145)

## 2024-07-05 LAB — CBC
HCT: 26.1 % — ABNORMAL LOW (ref 36.0–46.0)
Hemoglobin: 8.3 g/dL — ABNORMAL LOW (ref 12.0–15.0)
MCH: 31 pg (ref 26.0–34.0)
MCHC: 31.8 g/dL (ref 30.0–36.0)
MCV: 97.4 fL (ref 80.0–100.0)
Platelets: 219 K/uL (ref 150–400)
RBC: 2.68 MIL/uL — ABNORMAL LOW (ref 3.87–5.11)
RDW: 14.6 % (ref 11.5–15.5)
WBC: 9.5 K/uL (ref 4.0–10.5)
nRBC: 0 % (ref 0.0–0.2)

## 2024-07-05 LAB — MAGNESIUM: Magnesium: 1.7 mg/dL (ref 1.7–2.4)

## 2024-07-05 LAB — GLUCOSE, CAPILLARY: Glucose-Capillary: 89 mg/dL (ref 70–99)

## 2024-07-05 MED ORDER — MAGNESIUM SULFATE 2 GM/50ML IV SOLN
2.0000 g | Freq: Once | INTRAVENOUS | Status: AC
  Administered 2024-07-05: 2 g via INTRAVENOUS
  Filled 2024-07-05: qty 50

## 2024-07-05 MED ORDER — POTASSIUM CHLORIDE CRYS ER 20 MEQ PO TBCR
40.0000 meq | EXTENDED_RELEASE_TABLET | Freq: Once | ORAL | Status: AC
  Administered 2024-07-05: 40 meq via ORAL
  Filled 2024-07-05: qty 2

## 2024-07-05 MED ORDER — WARFARIN SODIUM 2.5 MG PO TABS
2.5000 mg | ORAL_TABLET | Freq: Once | ORAL | Status: AC
  Administered 2024-07-05: 2.5 mg via ORAL
  Filled 2024-07-05: qty 1

## 2024-07-05 MED ORDER — BIOTENE DRY MOUTH MT LIQD: 15.0000 mL | OROMUCOSAL | Status: DC | PRN

## 2024-07-05 NOTE — Progress Notes (Signed)
 Spoke with Dr. Tedra office regarding radial cast causing a skin ulceration on the patient's thumb.   She is not due to have this removed for about two more weeks, but ortho will review.  RN has placed gauze and dressing under the cast.  [name] [name] Dinari Stgermaine, PA-C

## 2024-07-05 NOTE — Progress Notes (Signed)
 NAME:  ANWITHA [name], MRN:  [id number], DOB:  Mar 29, 1961, LOS: 9 ADMISSION DATE:  06/25/2024, CONSULTATION DATE:  07/03/2024 REFERRING MD:  Dr. Quentin - EDP , CHIEF COMPLAINT:  Hypotension    History of Present Illness:  [name] [name] is a 63 y.o. female with a PMH significant for antiphospholipid syndrome on Coumadin  HTN, HLD, ischemic cardiomyopathy s/p ERCP, prior CVA, GERD, and substance abuse who initially presented to the ED) after suffering mechanical fall.  Workup reporting all abdominal wall hematoma.  Patient was also seen persistently hypotensive prompting admission to ICU.  By 7/5 patient remained hypotensive and pressor dependent prompting decision to move to [name] for further ongoing workup and management  Pertinent  Medical History   Past Medical History:  Diagnosis Date   AICD (automatic cardioverter/defibrillator) present    Aneurysm of internal carotid artery    Anti-phospholipid syndrome (HCC)    Anticardiolipin antibody positive    Chronic Coumadin    Brain aneurysm    followed by Dr. Dolphus   Chronic combined systolic (congestive) and diastolic (congestive) heart failure (HCC)    Cocaine abuse in remission [facility])    Coronary atherosclerosis of native coronary artery    Previous invasive cardiac testing was done at a facility in [place], [place].  Occluded diagonal, Diffuse LAD disease, Occluded Cx, and non obstructive RCA.    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  7/5 remains pressor dependent prompting transfer to [facility] for further workup 7/7 off pressors   Interim History / Subjective:  Only complaint is her cast digging into her thumb She has not attempted to get out of bed yet No pressor requirement Hgb drifting down but remains <8  Objective    Blood pressure (!) 99/51, pulse 78, temperature 98.2 F (36.8 C), temperature source Oral, resp. rate 16, height 5' 6 (1.676 m), weight 87.1 kg, SpO2 93%.        Intake/Output Summary (Last 24 hours) at 07/05/2024 0932 Last data filed at 07/05/2024 0600 Gross per 24 hour  Intake 216.93 ml  Output 2665 ml  Net -2448.07 ml   Filed Weights   06/30/24 0306 07/01/24 0500 07/01/24 0839  Weight: 94.9 kg 94.5 kg 87.1 kg    Examination: General: well nourished F resting in bed in NAD  HEENT: [place]/AT, eyes anicteric.  moist mucus membranes Neuro: Alert, awake following commands Chest: clear bilaterally no wheezes or rhonchi Heart: Regular rate and rhythm, no murmurs or gallops Abdomen: Soft, nontender, nondistended, bowel sounds present Skin: Large ecchymosis noted on left side of the abdominal wall, left hip and left thigh, JP drain in place draining dark blood Extremities: Plaster cast is placed on left upper extremity  Labs and images reviewed     Resolved problem  list   Assessment and Plan   Abdominal wall hematoma status post fall Acute blood loss anemia Underwent exploration with surgery to rule out abscess 7/3, cultures negative  She does have large bruise on left side of abdomen, left hip region and left thigh JP drain in place, minimal dark bloody output General Surgery to follow Monitor H&H and transfuse if less than 7 If she remains stable today and is able to work with PT then can likely  transfer out of the ICU  Supratherapeutic INR in the setting of warfarin therapy present on admission Patient presented with INR of 6.3 Anticoagulation kept on hold Now INR is 1.7 Coumadin  was restarted, watch for signs of bleeding  Persistent hypotension Improved and now off pressors Continue midodrine  15 mg 3 times daily for now  SBP goal to 90-100 Start regular diet Continue holding GDMT  CAD with prior MI Chronic combined HFrEF and HFpEF status post AICD Echocardiogram 06/27/2024 with EF of 35 to 40% and grade 1 diastolic dysfunction Essential hypertension Hyperlipidemia Paroxysmal A-fib Does not take antiplatelets at home even though she had MI before She follows cardiology with Dr. Rolan Continue Zetia  Continuous telemetry  Remain in sinus rhythm Hold GDMT, resume as BP stabilizes  Continue amiodarone  Given Lasix  yesterday with good response, appears fairly euvolemic today  Persistent Falls  Pt does not describe these as syncopal events, no dizziness See if occurs here while working with PT and if concurrent with any arrhythmias  Consider neurology consult     Antiphospholipid antibody syndrome Restarted back on Coumadin   Hyponatremia, hypervolemic NA improved to 131 Will give Lasix  40 mg today, assess diuretic needs every day  Left distal radius fracture Patient has hard cast Have contacted ortho to evaluate if she can come off of this hard cast as it has been rubbing left thumb causing deep ulceration and pressure injury  Best Practice (right click and Reselect all SmartList Selections daily)   Diet/type: Regular consistency (see orders) DVT prophylaxis Coumadin  Pressure ulcer(s): Patient has stage III ulcer on left thumb due to rubbing from the cast, POA GI prophylaxis: PPI Lines: Central line Foley:  Yes, and it is no longer needed and removal ordered  Code Status:  full code Last date of multidisciplinary goals of care discussion: 7/6 patient was  updated at bedside, decision was to continue full scope of care  Labs   CBC: Recent Labs  Lab 06/30/24 0411 07/01/24 0411 07/02/24 0409 07/03/24 0455 07/03/24 2052 07/04/24 0605 07/05/24 0320  WBC 14.6*   < > 9.7 13.0* 10.4 10.1 9.5  NEUTROABS 11.9*  --   --   --   --   --   --   HGB 10.6*   < > 9.6* 9.1* 9.1* 8.8* 8.3*  HCT 31.9*   < > 28.8* 28.8* 28.1* 27.7* 26.1*  MCV 94.4   < > 96.3 97.0 96.9 98.2 97.4  PLT 212   < > 206 196 233 208 219   < > = values in this interval not displayed.    Basic Metabolic Panel: Recent Labs  Lab 07/01/24 0411 07/02/24 0409 07/03/24 0455 07/04/24 0605 07/05/24 0320  NA 130* 131* 127* 131* 132*  K 3.6 3.7 3.6 3.9 3.4*  CL 96* 97* 98 100 98  CO2 26 26 25 24 27   GLUCOSE 114* 91 136* 85 83  BUN 9 12 11  7* 9  CREATININE 0.81 0.80 0.78 0.78 0.91  CALCIUM  7.8* 7.7* 7.5* 7.8* 7.8*  MG  --   --   --   --  1.7   GFR: Estimated Creatinine Clearance: 72.2 mL/min (by C-G formula based on SCr of 0.91 mg/dL). Recent Labs  Lab 06/28/24 1904 06/29/24 0418 07/03/24 0455 07/03/24 2052 07/04/24 0605 07/05/24 0320  PROCALCITON 0.16  --   --   --   --   --   WBC  --    < > 13.0* 10.4 10.1 9.5   < > = values in this interval not displayed.    Liver Function Tests: Recent Labs  Lab 06/30/24 0411  AST 41  ALT 53*  ALKPHOS 71  BILITOT 0.9  PROT 5.8*  ALBUMIN  2.5*   No results for input(s): LIPASE, AMYLASE in the last 168 hours. No results for input(s): AMMONIA in the last 168 hours.  ABG    Component Value Date/Time   PHART 7.422 12/18/2020 0958   PCO2ART 46.1 12/18/2020 0958   PO2ART 125 (H) 12/18/2020 0958   HCO3 29.0 (H) 03/05/2021 0801   HCO3 28.9 (H) 03/05/2021 0801   TCO2 24 06/25/2024 2245   ACIDBASEDEF 1.0 12/18/2020 1004   O2SAT 61 07/03/2024 1905     Coagulation Profile: Recent Labs  Lab 06/30/24 0411 07/03/24 1905 07/04/24 0605 07/05/24 0320  INR 1.4* 1.4* 1.4* 1.7*    Cardiac Enzymes: No results for  input(s): CKTOTAL, CKMB, CKMBINDEX, TROPONINI in the last 168 hours.  HbA1C: HB A1C (BAYER DCA - WAIVED)  Date/Time Value Ref Range Status  12/29/2023 02:40 PM 4.6 (L) 4.8 - 5.6 % Final    Comment:             Prediabetes: 5.7 - 6.4          Diabetes: >6.4          Glycemic control for adults with diabetes: <7.0   05/22/2021 02:04 PM 5.2 <7.0 % Final    Comment:                                          Diabetic Adult            <7.0                                       Healthy Adult        4.3 - 5.7                                                           (DCCT/NGSP) American Diabetes Association's Summary of Glycemic Recommendations for Adults with Diabetes: Hemoglobin A1c <7.0%. More stringent glycemic goals (A1c <6.0%) may further reduce complications at the cost of increased risk of hypoglycemia.     CBG: Recent Labs  Lab 06/28/24 1122  GLUCAP 111*    CRITICAL CARE Performed by: Leita SAUNDERS Ranada Vigorito   Total critical care time: 35 minutes  Critical care time was exclusive of separately billable procedures and treating other patients.  Critical care was necessary to treat or prevent imminent or life-threatening deterioration.  Critical care was time spent personally by me on the following activities: development of treatment  plan with patient and/or surrogate as well as nursing, discussions with consultants, evaluation of patient's response to treatment, examination of patient, obtaining history from patient or surrogate, ordering and performing treatments and interventions, ordering and review of laboratory studies, ordering and review of radiographic studies, pulse oximetry and re-evaluation of patient's condition.   Leita SAUNDERS Delbra Zellars, PA-C [facility] See Amion for pager If no response to pager , please call [phone] until 7pm After 7:00 pm call Elink  [phone]

## 2024-07-05 NOTE — Progress Notes (Signed)
 [facility] ADULT ICU REPLACEMENT PROTOCOL   The patient does apply for the [facility] Adult ICU Electrolyte Replacment Protocol based on the criteria listed below:   1.Exclusion criteria: TCTS, ECMO, Dialysis, and Myasthenia Gravis patients 2. Is GFR >/= 30 ml/min? Yes.    Patient's GFR today is >60 3. Is SCr </= 2? Yes.   Patient's SCr is 0.91 mg/dL 4. Did SCr increase >/= 0.5 in 24 hours? No. 5.Pt's weight >40kg  Yes.   6. Abnormal electrolyte(s): K+ 3.4, Mag 1.7  7. Electrolytes replaced per protocol 8.  Call MD STAT for K+ </= 2.5, Phos </= 1, or Mag </= 1 Physician:  Dr. Fate Rummer, Rec[place] ORN 07/05/2024 5:59 AM

## 2024-07-05 NOTE — Evaluation (Signed)
 Physical Therapy Evaluation Patient Details Name: [name] [name] MRN: [id number] DOB: August 03, 1961 Today's Date: 07/05/2024  History of Present Illness  63 y.o. female admitted to [facility] s/p fall with abdominal wall injury pt with nausea and vomiting limiting intake with recent start to Wegovy . 7/2 moved to [facility] HTN and pressor dependent  7/3 JP drain L abdomen placed PMH antiphospholipid syndrome on warfarin, CHF, A-fib, vtach vfib with ICD shock, CVA, CAD with MI, back surg 5 dis fusion 2010  Clinical Impression  Pt admitted with above diagnosis. Pt was able to stand to [facility] with mod assist of 2 and was moved to bed. Second stand from [place] with less assist. Pt deconditioned and lives alone therefore will need post acute rehab < 3 hours prior to going home. Pt very fearful of falling. Will follow acutely.  Pt currently with functional limitations due to the deficits listed below (see PT Problem List). Pt will benefit from acute skilled PT to increase their independence and safety with mobility to allow discharge.           If plan is discharge home, recommend the following: Help with stairs or ramp for entrance;Assistance with cooking/housework;A lot of help with bathing/dressing/bathroom;A lot of help with walking and/or transfers   Can travel by private vehicle   No    Equipment Recommendations  (possibly left platform for RW)  Recommendations for Other Services       Functional Status Assessment Patient has had a recent decline in their functional status and demonstrates the ability to make significant improvements in function in a reasonable and predictable amount of time.     Precautions / Restrictions Precautions Precautions: Fall Recall of Precautions/Restrictions: Intact Precaution/Restrictions Comments: JP drain L abdomen Required Braces or Orthoses: Splint/Cast Splint/Cast: L forearm - admitted with cast noted to have wound at thumb. See media image 07/03/24 Restrictions Weight  Bearing Restrictions Per Provider Order: Yes LUE Weight Bearing Per Provider Order: Other Position/Activity Restrictions: use platform walker for LUE      Mobility  Bed Mobility   Bed Mobility: Sit to Supine       Sit to supine: Min assist   General bed mobility comments: Pt in chair on arrrival.  Pt needed assist for LES back into bed.    Transfers Overall transfer level: Needs assistance Equipment used: Ambulation equipment used Transfers: Sit to/from Stand, Bed to chair/wheelchair/BSC Sit to Stand: Mod assist, +2 physical assistance           General transfer comment: Pt needed mod assist of 2  to stand to [facility] with use of pad. Once up, pt min assist.  Pt was moved in [place] from recliner to bed.  Second stand, pt able to weight shift and pick feet up slightly. Will try RW next visit as pt was tired after sitting up. Transfer via Lift Equipment: Stedy  Ambulation/Gait                  Stairs            Wheelchair Mobility     Tilt Bed    Modified Rankin (Stroke Patients Only)       Balance Overall balance assessment: Needs assistance Sitting-balance support: Bilateral upper extremity supported, Feet supported Sitting balance-Leahy Scale: Fair Sitting balance - Comments: fair/good seated at EOB   Standing balance support: During functional activity, Reliant on assistive device for balance, Bilateral upper extremity supported Standing balance-Leahy Scale: Poor Standing balance comment: using Stedy and +2  min to mod assist                             Pertinent Vitals/Pain Pain Assessment Pain Assessment: Faces Faces Pain Scale: Hurts little more Pain Location: lower back, front of stomach lower back Pain Descriptors / Indicators: Discomfort Pain Intervention(s): Limited activity within patient's tolerance, Monitored during session, Repositioned    Home Living Family/patient expects to be discharged to:: Private  residence Living Arrangements: Alone Available Help at Discharge: Friend(s) Type of Home: Mobile home Home Access: Stairs to enter Entrance Stairs-Rails: Right Entrance Stairs-Number of Steps: 4   Home Layout: One level Home Equipment: Other (comment);Shower Counsellor (2 wheels) (frame for grab bars around the commode, had a platform but broke it) Additional Comments: platform walker; pt planning on getting a ramp to enter her home dog Mac Mac ( girl that is blind in one eye bulldog)    Prior Function Prior Level of Function : Independent/Modified Independent             Mobility Comments: Pt was ambulating with a platform walker since her wrist injury. Prior to that the pt would often use a RW. ADLs Comments: order groceries to the house and get off the screen in porch, gets rides from nephew or sister in law     Extremity/Trunk Assessment   Upper Extremity Assessment Upper Extremity Assessment: Defer to OT evaluation LUE Deficits / Details: cast starting at [facility] with thumb noted to have break due to cast rubbing MD notified and ortho consult placed 07/05/24. pt reports being able to have task taken down 2 weeks prior to admission date but not wanting cast off due to fear of falling again LUE Coordination:  (decreased digit flexion)    Lower Extremity Assessment Lower Extremity Assessment: Generalized weakness    Cervical / Trunk Assessment Cervical / Trunk Assessment: Other exceptions (hx of back surg)  Communication   Communication Communication: No apparent difficulties    Cognition Arousal: Alert Behavior During Therapy: WFL for tasks assessed/performed   PT - Cognitive impairments: No apparent impairments                         Following commands: Intact       Cueing Cueing Techniques: Verbal cues     General Comments General comments (skin integrity, edema, etc.): 2LO2 with VSS    Exercises General Exercises - Lower Extremity Ankle  Circles/Pumps: Both, 10 reps Quad Sets: Both, 10 reps Heel Slides: Both, 5 reps   Assessment/Plan    PT Assessment Patient needs continued PT services  PT Problem List Decreased strength;Decreased balance;Decreased activity tolerance       PT Treatment Interventions Gait training;Functional mobility training;Stair training;Patient/family education;DME instruction;Therapeutic activities;Balance training;Therapeutic exercise    PT Goals (Current goals can be found in the Care Plan section)  Acute Rehab PT Goals Patient Stated Goal: To walk with confidence PT Goal Formulation: With patient Time For Goal Achievement: 07/19/24 Potential to Achieve Goals: Good    Frequency Min 2X/week     Co-evaluation               AM-PAC PT 6 Clicks Mobility  Outcome Measure Help needed turning from your back to your side while in a flat bed without using bedrails?: None Help needed moving from lying on your back to sitting on the side of a flat bed without using bedrails?: A Little  Help needed moving to and from a bed to a chair (including a wheelchair)?: Total Help needed standing up from a chair using your arms (e.g., wheelchair or bedside chair)?: A Lot Help needed to walk in hospital room?: Total Help needed climbing 3-5 steps with a railing? : Total 6 Click Score: 12    End of Session Equipment Utilized During Treatment: Gait belt;Oxygen Activity Tolerance: Patient tolerated treatment well;Patient limited by fatigue Patient left: in bed;with bed alarm set Nurse Communication: Need for lift equipment;Mobility status Laurent) PT Visit Diagnosis: Unsteadiness on feet (R26.81);Repeated falls (R29.6);Muscle weakness (generalized) (M62.81)    Time: 8574-8551 PT Time Calculation (min) (ACUTE ONLY): 23 min   Charges:   PT Evaluation $PT Re-evaluation: 1 Re-eval PT Treatments $Therapeutic Activity: 8-22 mins PT General Charges $$ ACUTE PT VISIT: 1 Visit         Daxon Kyne  M,PT Acute Rehab Services [phone]   Stephane JULIANNA Bevel 07/05/2024, 3:59 PM

## 2024-07-05 NOTE — Plan of Care (Signed)
  Problem: Education: Goal: Knowledge of General Education information will improve Description: Including pain rating scale, medication(s)/side effects and non-pharmacologic comfort measures 07/05/2024 0319 by Woodard Soulier, RN Outcome: Progressing 07/05/2024 0319 by Woodard Soulier, RN Outcome: Progressing   Problem: Health Behavior/Discharge Planning: Goal: Ability to manage health-related needs will improve 07/05/2024 0319 by Woodard Soulier, RN Outcome: Progressing 07/05/2024 0319 by Woodard Soulier, RN Outcome: Progressing   Problem: Clinical Measurements: Goal: Ability to maintain clinical measurements within normal limits will improve 07/05/2024 0319 by Woodard Soulier, RN Outcome: Progressing 07/05/2024 0319 by Woodard Soulier, RN Outcome: Progressing Goal: Will remain free from infection 07/05/2024 0319 by Woodard Soulier, RN Outcome: Progressing 07/05/2024 0319 by Woodard Soulier, RN Outcome: Progressing Goal: Diagnostic test results will improve 07/05/2024 0319 by Woodard Soulier, RN Outcome: Progressing 07/05/2024 0319 by Woodard Soulier, RN Outcome: Progressing Goal: Respiratory complications will improve 07/05/2024 0319 by Woodard Soulier, RN Outcome: Progressing 07/05/2024 0319 by Woodard Soulier, RN Outcome: Progressing Goal: Cardiovascular complication will be avoided 07/05/2024 0319 by Woodard Soulier, RN Outcome: Progressing 07/05/2024 0319 by Woodard Soulier, RN Outcome: Progressing   Problem: Activity: Goal: Risk for activity intolerance will decrease 07/05/2024 0319 by Woodard Soulier, RN Outcome: Progressing 07/05/2024 0319 by Woodard Soulier, RN Outcome: Progressing   Problem: Nutrition: Goal: Adequate nutrition will be maintained 07/05/2024 0319 by Woodard Soulier, RN Outcome: Progressing 07/05/2024 0319 by Woodard Soulier, RN Outcome: Progressing   Problem: Coping: Goal: Level of anxiety will decrease 07/05/2024 0319 by Woodard Soulier, RN Outcome:  Progressing 07/05/2024 0319 by Woodard Soulier, RN Outcome: Progressing   Problem: Elimination: Goal: Will not experience complications related to bowel motility 07/05/2024 0319 by Woodard Soulier, RN Outcome: Progressing 07/05/2024 0319 by Woodard Soulier, RN Outcome: Progressing Goal: Will not experience complications related to urinary retention 07/05/2024 0319 by Woodard Soulier, RN Outcome: Progressing 07/05/2024 0319 by Woodard Soulier, RN Outcome: Progressing   Problem: Pain Managment: Goal: General experience of comfort will improve and/or be controlled 07/05/2024 0319 by Woodard Soulier, RN Outcome: Progressing 07/05/2024 0319 by Woodard Soulier, RN Outcome: Progressing   Problem: Safety: Goal: Ability to remain free from injury will improve 07/05/2024 0319 by Woodard Soulier, RN Outcome: Progressing 07/05/2024 0319 by Woodard Soulier, RN Outcome: Progressing   Problem: Skin Integrity: Goal: Risk for impaired skin integrity will decrease 07/05/2024 0319 by Woodard Soulier, RN Outcome: Progressing 07/05/2024 0319 by Woodard Soulier, RN Outcome: Progressing

## 2024-07-05 NOTE — Progress Notes (Signed)
 Heart Failure Navigator Progress Note  Assessed for Heart & Vascular TOC clinic readiness.  Patient does not meet criteria due to Advanced Heart Failure Team patient of Dr. Shirlee Latch.   Navigator will sign off at this time.    Rhae Hammock, BSN, Scientist, clinical (histocompatibility and immunogenetics) Only

## 2024-07-05 NOTE — Evaluation (Signed)
 Occupational Therapy Evaluation Patient Details Name: [name] [name] MRN: [id number] DOB: 04-01-1961 Today's Date: 07/05/2024   History of Present Illness   63 y.o. female admitted to [facility] s/p fall with abdominal wall injury pt with nausea and vomiting limiting intake with recent start to Wegovy . 7/2 moved to [facility] HTN and pressor dependent  7/3 JP drain L abdomen placed PMH antiphospholipid syndrome on warfarin, CHF, A-fib, vtach vfib with ICD shock, CVA, CAD with MI, back surg 5 dis fusion 2010     Clinical Impressions PT admitted with s/p fall with L side hematoma. Pt currently with functional limitiations due to the deficits listed below (see OT problem list). Pt lives alone with her dog Mac Mac. Pt has family (A) from nephew (who works) and sister in Social worker for appointments. Pt at this time dependent on elevated surface to complete sit<>Stand transfer. Recommendation for stedy use oob each meal with RN staff. OT contacting RN / MD regarding poor fitting cast L UE with wound present on the thumb and pt's concerns for severe dry mouth. ( Pending ortho consult and biotene order placed by team) Pt will benefit from skilled OT to increase their independence and safety with adls and balance to allow discharge skilled inpatient follow up therapy, <3 hours/day. Pt currently deconditioned decreased activity tolerance and increased risk to fall if d/c home alone. Pt hopeful to d/c home to dog but agrees needs more strength to be able to complete that.      If plan is discharge home, recommend the following:   Two people to help with walking and/or transfers;Two people to help with bathing/dressing/bathroom     Functional Status Assessment   Patient has had a recent decline in their functional status and demonstrates the ability to make significant improvements in function in a reasonable and predictable amount of time.     Equipment Recommendations   Other (comment) (TBA but might need a new  platform for RW as she broke it in the fall. if Ortho will make WBAT then we will not need DME)     Recommendations for Other Services         Precautions/Restrictions   Precautions Precautions: Fall Recall of Precautions/Restrictions: Intact Precaution/Restrictions Comments: JP drain L abdomen Required Braces or Orthoses: Splint/Cast Splint/Cast: L forearm - admitted with cast noted to have wound at thumb. See media image 07/03/24 Restrictions Weight Bearing Restrictions Per Provider Order: Yes LUE Weight Bearing Per Provider Order: Non weight bearing Other Position/Activity Restrictions: use platform walker for LUE     Mobility Bed Mobility Overal bed mobility: Modified Independent             General bed mobility comments: exiting on the L side    Transfers Overall transfer level: Needs assistance Equipment used: 2 person hand held assist Transfers: Sit to/from Stand, Bed to chair/wheelchair/BSC Sit to Stand: Mod assist, From elevated surface     Step pivot transfers: +2 physical assistance, +2 safety/equipment, Mod assist, From elevated surface     General transfer comment: pt anxious about transfers and stepping to the left side and needing cues to control breathing and control sit<>Stand . pt with controlled descend to chair      Balance Overall balance assessment: Needs assistance Sitting-balance support: Bilateral upper extremity supported, Feet supported Sitting balance-Leahy Scale: Fair     Standing balance support: Single extremity supported, During functional activity, Reliant on assistive device for balance Standing balance-Leahy Scale: Poor  ADL either performed or assessed with clinical judgement   ADL Overall ADL's : Needs assistance/impaired Eating/Feeding: Modified independent   Grooming: Set up;Sitting               Lower Body Dressing: Maximal assistance;Sit to/from stand   Toilet  Transfer: +2 for safety/equipment;+2 for physical assistance;Moderate assistance;Stand-pivot Toilet Transfer Details (indicate cue type and reason): simulated to chair                 Vision Baseline Vision/History: 1 Wears glasses Patient Visual Report: No change from baseline Vision Assessment?: No apparent visual deficits     Perception         Praxis         Pertinent Vitals/Pain Pain Assessment Pain Assessment: 0-10 Pain Score: 7  Pain Location: lower back, front of stomach lower back Pain Descriptors / Indicators: Discomfort Pain Intervention(s): Repositioned, Limited activity within patient's tolerance, Premedicated before session     Extremity/Trunk Assessment Upper Extremity Assessment Upper Extremity Assessment: LUE deficits/detail;Overall [facility] for tasks assessed;Right hand dominant LUE Deficits / Details: cast starting at [facility] with thumb noted to have break due to cast rubbing MD notified and ortho consult placed 07/05/24. pt reports being able to have task taken down 2 weeks prior to admission date but not wanting cast off due to fear of falling again LUE Coordination:  (decreased digit flexion)   Lower Extremity Assessment Lower Extremity Assessment: Defer to PT evaluation;Generalized weakness   Cervical / Trunk Assessment Cervical / Trunk Assessment: Other exceptions (hx of back surg)   Communication Communication Communication: No apparent difficulties   Cognition Arousal: Alert Behavior During Therapy: WFL for tasks assessed/performed Cognition: No apparent impairments                               Following commands: Intact       Cueing  General Comments      RA on arrival with 87%,  2L replaced on patient 92% see vitals for BP controlled and MAP 80s throughout   Exercises Exercises: Other exercises Other Exercises Other Exercises: pt anxious concerning sit<>stand. pt able to weight shift L to R with back of chair for UB  support and bed behind. pt reports i feel safe doing it this way. Other Exercises: educated on use of stedy and pt expressed feelin that is a great option for getting oob more   Shoulder Instructions      Home Living Family/patient expects to be discharged to:: Private residence Living Arrangements: Alone Available Help at Discharge: Friend(s) Type of Home: Mobile home Home Access: Stairs to enter [employer] of Steps: 4 Entrance Stairs-Rails: Right Home Layout: One level     Bathroom Shower/Tub: Producer, television/film/video: Standard Bathroom Accessibility: Yes   Home Equipment: Other (comment);Shower Counsellor (2 wheels) (frame for grab bars around the commode, had a platform but broke it)   Additional Comments: platform walker; pt planning on getting a ramp to enter her home dog Mac Mac ( girl that is blind in one eye bulldog)      Prior Functioning/Environment Prior Level of Function : Independent/Modified Independent               ADLs Comments: order groceries to the house and get off the screen in porch, gets rides from nephew or sister in law    OT Problem List: Impaired balance (sitting and/or standing);Decreased safety awareness;Decreased  knowledge of use of DME or AE;Decreased knowledge of precautions;Decreased activity tolerance;Decreased strength;Obesity   OT Treatment/Interventions: Self-care/ADL training;Therapeutic exercise;DME and/or AE instruction;Therapeutic activities;Patient/family education;Balance training;Energy conservation;Modalities      OT Goals(Current goals can be found in the care plan section)   Acute Rehab OT Goals Patient Stated Goal: to get home to dog Mac Mac OT Goal Formulation: With patient Time For Goal Achievement: 07/19/24 Potential to Achieve Goals: Good   OT Frequency:  Min 2X/week    Co-evaluation              AM-PAC OT 6 Clicks Daily Activity     Outcome Measure Help from another  person eating meals?: None Help from another person taking care of personal grooming?: A Little Help from another person toileting, which includes using toliet, bedpan, or urinal?: A Lot Help from another person bathing (including washing, rinsing, drying)?: A Lot Help from another person to put on and taking off regular upper body clothing?: A Lot Help from another person to put on and taking off regular lower body clothing?: A Lot 6 Click Score: 15   End of Session Equipment Utilized During Treatment: Oxygen Nurse Communication: Mobility status;Precautions;Weight bearing status  Activity Tolerance: Patient tolerated treatment well Patient left: in chair;with call bell/phone within reach;with nursing/sitter in room  OT Visit Diagnosis: Unsteadiness on feet (R26.81);Other abnormalities of gait and mobility (R26.89);Muscle weakness (generalized) (M62.81);Dizziness and giddiness (R42);Repeated falls (R29.6)                Time: 8765-8694 OT Time Calculation (min): 31 min Charges:  OT General Charges $OT Visit: 1 Visit OT Evaluation $OT Eval Moderate Complexity: 1 Mod OT Treatments $Self Care/Home Management : 8-22 mins   Brynn, OTR/L  Acute Rehabilitation Services Office: [phone] .   Ely Molt 07/05/2024, 1:30 PM

## 2024-07-05 NOTE — Progress Notes (Signed)
 PHARMACY - ANTICOAGULATION CONSULT NOTE  Pharmacy Consult for warfarin Indication: VTE  Allergies  Allergen Reactions   Beef-Derived Drug Products Anaphylaxis and Nausea Only    From tick bite   Metoclopramide Hcl Anaphylaxis    Non responsive   Penicillins Anaphylaxis    Shock Immediate rash, facial/tongue/throat swelling, SOB or lightheadedness with hypotension   Pork-Derived Products Anaphylaxis and Nausea Only    From tick bite. Has tolerated heparin /lovenox    Metoclopramide Other (See Comments)    Unknown    Metoprolol Other (See Comments)    Syncope    Statins Other (See Comments)    Myalgias    Patient Measurements: Height: 5' 6 (167.6 cm) Weight: 87.1 kg (192 lb) IBW/kg (Calculated) : 59.3 HEPARIN  DW (KG): 78  Vital Signs: Temp: 97.9 F (36.6 C) (07/07 1124) Temp Source: Oral (07/07 1124) BP: 106/62 (07/07 1000) Pulse Rate: 68 (07/07 1000)  Labs: Recent Labs    07/03/24 0455 07/03/24 1905 07/03/24 2052 07/04/24 0605 07/05/24 0320  HGB 9.1*  --  9.1* 8.8* 8.3*  HCT 28.8*  --  28.1* 27.7* 26.1*  PLT 196  --  233 208 219  LABPROT  --  18.1*  --  18.2* 20.6*  INR  --  1.4*  --  1.4* 1.7*  CREATININE 0.78  --   --  0.78 0.91    Estimated Creatinine Clearance: 72.2 mL/min (by C-G formula based on SCr of 0.91 mg/dL).   Medical History: Past Medical History:  Diagnosis Date   AICD (automatic cardioverter/defibrillator) present    Aneurysm of internal carotid artery    Anti-phospholipid syndrome (HCC)    Anticardiolipin antibody positive    Chronic Coumadin    Brain aneurysm    followed by Dr. Dolphus   Chronic combined systolic (congestive) and diastolic (congestive) heart failure (HCC)    Cocaine abuse in remission [facility])    Coronary atherosclerosis of native coronary artery    Previous invasive cardiac testing was done at a facility in [place], [place].  Occluded diagonal, Diffuse LAD disease, Occluded Cx, and non obstructive RCA.     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 Pharmacy to resume warfarin.   Home regimen is 5mg  MWF, 2.5mg  TTSS > of note INR was 6.3 on admission requiring vit K.  INR today is subtherapeutic at 1.7 after warfarin restarted on 7/5. Hgb 8.3, plt 219. No s/sx of bleeding.   Goal of Therapy:  INR 2-3 Monitor platelets by anticoagulation protocol: Yes   Plan:  Warfarin 2.5 mg PO x1 tonight Daily INR  Thank you for allowing pharmacy to participate in this patient's care,  Suzen Sour, PharmD, BCCCP Clinical Pharmacist  Phone: [phone] 07/05/2024 11:49 AM  Please check AMION for all [facility] Pharmacy phone numbers After 10:00 PM, call Main Pharmacy [phone]

## 2024-07-06 DIAGNOSIS — S301XXD Contusion of abdominal wall, subsequent encounter: Secondary | ICD-10-CM | POA: Diagnosis not present

## 2024-07-06 LAB — BASIC METABOLIC PANEL WITH GFR
Anion gap: 6 (ref 5–15)
BUN: 7 mg/dL — ABNORMAL LOW (ref 8–23)
CO2: 25 mmol/L (ref 22–32)
Calcium: 7.8 mg/dL — ABNORMAL LOW (ref 8.9–10.3)
Chloride: 101 mmol/L (ref 98–111)
Creatinine, Ser: 0.67 mg/dL (ref 0.44–1.00)
GFR, Estimated: >60 mL/min (ref >60)
Glucose, Bld: 76 mg/dL (ref 70–99)
Potassium: 4 mmol/L (ref 3.5–5.1)
Sodium: 132 mmol/L — ABNORMAL LOW (ref 135–145)

## 2024-07-06 LAB — CBC
HCT: 26.2 % — ABNORMAL LOW (ref 36.0–46.0)
Hemoglobin: 8.4 g/dL — ABNORMAL LOW (ref 12.0–15.0)
MCH: 31.3 pg (ref 26.0–34.0)
MCHC: 32.1 g/dL (ref 30.0–36.0)
MCV: 97.8 fL (ref 80.0–100.0)
Platelets: 219 K/uL (ref 150–400)
RBC: 2.68 MIL/uL — ABNORMAL LOW (ref 3.87–5.11)
RDW: 15 % (ref 11.5–15.5)
WBC: 8 K/uL (ref 4.0–10.5)
nRBC: 0 % (ref 0.0–0.2)

## 2024-07-06 LAB — AEROBIC/ANAEROBIC CULTURE W GRAM STAIN (SURGICAL/DEEP WOUND)
Culture: NO GROWTH
Gram Stain: NONE SEEN

## 2024-07-06 LAB — MAGNESIUM: Magnesium: 1.9 mg/dL (ref 1.7–2.4)

## 2024-07-06 LAB — PROTIME-INR
INR: 1.9 — ABNORMAL HIGH (ref 0.8–1.2)
Prothrombin Time: 22.8 s — ABNORMAL HIGH (ref 11.4–15.2)

## 2024-07-06 MED ORDER — CARVEDILOL 3.125 MG PO TABS
3.1250 mg | ORAL_TABLET | Freq: Two times a day (BID) | ORAL | Status: DC
  Administered 2024-07-06 – 2024-07-10 (×8): 3.125 mg via ORAL
  Filled 2024-07-06 (×10): qty 1

## 2024-07-06 MED ORDER — WARFARIN SODIUM 2.5 MG PO TABS
2.5000 mg | ORAL_TABLET | Freq: Once | ORAL | Status: AC
  Administered 2024-07-06: 2.5 mg via ORAL
  Filled 2024-07-06: qty 1

## 2024-07-06 MED ORDER — POTASSIUM CHLORIDE CRYS ER 20 MEQ PO TBCR
40.0000 meq | EXTENDED_RELEASE_TABLET | Freq: Once | ORAL | Status: AC
  Administered 2024-07-06: 40 meq via ORAL
  Filled 2024-07-06: qty 2

## 2024-07-06 MED ORDER — BETHANECHOL CHLORIDE 10 MG PO TABS
10.0000 mg | ORAL_TABLET | Freq: Three times a day (TID) | ORAL | Status: AC
  Administered 2024-07-06 (×3): 10 mg via ORAL
  Filled 2024-07-06 (×6): qty 1

## 2024-07-06 MED ORDER — MAGNESIUM SULFATE 2 GM/50ML IV SOLN
2.0000 g | Freq: Once | INTRAVENOUS | Status: AC
  Administered 2024-07-06: 2 g via INTRAVENOUS
  Filled 2024-07-06: qty 50

## 2024-07-06 NOTE — TOC Initial Note (Signed)
 Transition of Care [facility]) - Initial/Assessment Note    Patient Details  Name: [name] [name] MRN: [id number] Date of Birth: 05/14/1961  [facility]) CM/SW Contact:    Luise JAYSON Pan, LCSWA Phone Number: 07/06/2024, 2:39 PM  Clinical Narrative:  CSW spoke with patient regarding PT recs for SNF. Patient asked CSW to speak to her nephew, Manus in regard to decision making as he is HCPOA. Advance Directives in chart. CSW called Manus and he is agreeable with patient going to SNF and asked questions about the process. CSW informed him of SNF workup process, insurance coverage and insurance authorization.  Manus asked CSW to send referrals to [facility], [facility], and other facilities in [place] area.                 TOC will continue to follow.    Expected Discharge Plan: Skilled Nursing Facility Barriers to Discharge: Continued Medical Work up, SNF Pending bed offer, Insurance Authorization   Patient Goals and CMS Choice Patient states their goals for this hospitalization and ongoing recovery are:: To get better          Expected Discharge Plan and Services In-house Referral: Clinical Social Work     Living arrangements for the past 2 months: Single Family Home                           HH Arranged: PT HH Agency: [facility] Date [facility] Agency Contacted: 06/28/24 Time HH Agency Contacted: [phone] Representative spoke with at [facility] Agency: Dorothe  Prior Living Arrangements/Services Living arrangements for the past 2 months: Single Family Home Lives with:: Self Patient language and need for interpreter reviewed:: Yes Do you feel safe going back to the place where you live?: Yes      Need for Family Participation in Patient Care: Yes (Comment) Care giver support system in place?: Yes (comment)   Criminal Activity/Legal Involvement Pertinent to Current Situation/Hospitalization: No - Comment as needed  Activities of Daily Living   ADL  Screening (condition at time of admission) Independently performs ADLs?: Yes (appropriate for developmental age) Is the patient deaf or have difficulty hearing?: No Does the patient have difficulty seeing, even when wearing glasses/contacts?: No Does the patient have difficulty concentrating, remembering, or making decisions?: No  Permission Sought/Granted Permission sought to share information with : Facility Medical sales representative, Family Supports Permission granted to share information with : Yes, Verbal Permission Granted  Share Information with NAME: Manus  Permission granted to share info w AGENCY: SNFs  Permission granted to share info w Relationship: Nephew  Permission granted to share info w Contact Information: [phone]  Emotional Assessment Appearance:: Appears stated age Attitude/Demeanor/Rapport: Engaged Affect (typically observed): Stable, Pleasant Orientation: : Oriented to Place, Oriented to Self, Oriented to  Time, Oriented to Situation Alcohol / Substance Use: Not Applicable Psych Involvement: No (comment)  Admission diagnosis:  Coagulopathy (HCC) [D68.9] Abdominal wall hematoma [S30.1XXA] Abdominal wall hematoma, initial encounter [S30.1XXA] Patient Active Problem List   Diagnosis Date Noted   Coagulopathy (HCC) 06/28/2024   Acute renal failure (HCC) 06/28/2024   Abdominal wall hematoma 06/26/2024   Supratherapeutic INR 06/26/2024   Fall at home, initial encounter 06/26/2024   Hypotension 06/26/2024   Hypoalbuminemia due to protein-calorie malnutrition (HCC) 06/26/2024   GAD (generalized anxiety disorder) 12/09/2023   Statin myopathy 03/12/2022   PTSD (post-traumatic stress disorder) 10/10/2021   Chronic pain 07/08/2021   Syncope 06/23/2021  Postmenopausal 05/23/2021   Allergy to alpha-gal 05/23/2021   Insomnia 02/09/2021   Unstable angina (HCC) 01/11/2021   Defibrillator discharge    CAD in native artery    Persistent atrial fibrillation (HCC)  12/15/2020   Prediabetes 12/15/2020   Chronic systolic CHF (congestive heart failure) (HCC) 12/15/2020   Ventricular tachycardia (HCC) 12/15/2020   Right carotid artery occlusion    GERD (gastroesophageal reflux disease) 08/11/2020   Allergic rhinitis 08/11/2020   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   Tobacco dependence 06/18/2017   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)    Left-sided weakness    Chronic combined systolic (congestive) and diastolic (congestive) heart failure (HCC) 07/26/2015   Encounter for therapeutic drug monitoring 02/02/2014   Dual implantable cardioverter-defibrillator in situ 06/01/2012   Long term current use of anticoagulant 03/22/2011   Essential hypertension, benign 05/29/2010   Mixed hyperlipidemia 09/26/2009   Peripheral vascular disease (HCC) 06/23/2009   Cardiomyopathy, ischemic 11/29/2008   CEREBROVASCULAR DISEASE 11/29/2008   Anticardiolipin syndrome (HCC) 11/29/2008   PCP:  Lavell Bari LABOR, FNP Pharmacy:   [facility] - [place], [place] - [address] [place] [address] Phone: [phone] Fax: [phone]  [facility] [place], [place] - [address] [place] [address] Phone: [phone] Fax: [phone]     Social Drivers of Health (SDOH) Social History: SDOH Screenings   Food Insecurity: No Food Insecurity (06/26/2024)  Housing: Low Risk  (06/26/2024)  Transportation Needs: No Transportation Needs (06/26/2024)  Utilities: Not At Risk (06/26/2024)  Alcohol Screen: Low Risk  (09/18/2023)  Depression (PHQ2-9): Low Risk  (03/11/2024)  Financial Resource Strain: Low Risk  (03/10/2024)  Physical Activity: Inactive (03/10/2024)  Social Connections: Socially Isolated (06/26/2024)  Stress: Stress Concern Present (03/10/2024)  Tobacco Use: High Risk (07/01/2024)  Health Literacy: Adequate Health Literacy  (09/18/2023)   SDOH Interventions:     Readmission Risk Interventions    06/26/2024    5:05 PM  Readmission Risk Prevention Plan  Post Dischage Appt Complete  Medication Screening Complete  Transportation Screening Complete

## 2024-07-06 NOTE — NC FL2 (Signed)
 [name]  MEDICAID FL2 LEVEL OF CARE FORM     IDENTIFICATION  Patient Name: [name] [name] Birthdate: 1961-01-24 Sex: female Admission Date (Current Location): 06/25/2024  [facility] and [place] Number:  Producer, television/film/video and Address:  [facility] [place], [address], [place], [place] [address]      Provider Number: [id number]  Attending Physician Name and Address:  Fairy Frames, MD  Relative Name and Phone Number:       Current Level of Care: Hospital Recommended Level of Care: Skilled Nursing Facility Prior Approval Number:    Date Approved/Denied:   PASRR Number: [id number]  Discharge Plan: SNF    Current Diagnoses: Patient Active Problem List   Diagnosis Date Noted   Coagulopathy (HCC) 06/28/2024   Acute renal failure (HCC) 06/28/2024   Abdominal wall hematoma 06/26/2024   Supratherapeutic INR 06/26/2024   Fall at home, initial encounter 06/26/2024   Hypotension 06/26/2024   Hypoalbuminemia due to protein-calorie malnutrition (HCC) 06/26/2024   GAD (generalized anxiety disorder) 12/09/2023   Statin myopathy 03/12/2022   PTSD (post-traumatic stress disorder) 10/10/2021   Chronic pain 07/08/2021   Syncope 06/23/2021   Postmenopausal 05/23/2021   Allergy to alpha-gal 05/23/2021   Insomnia 02/09/2021   Unstable angina (HCC) 01/11/2021   Defibrillator discharge    CAD in native artery    Persistent atrial fibrillation (HCC) 12/15/2020   Prediabetes 12/15/2020   Chronic systolic CHF (congestive heart failure) (HCC) 12/15/2020   Ventricular tachycardia (HCC) 12/15/2020   Right carotid artery occlusion    GERD (gastroesophageal reflux disease) 08/11/2020   Allergic rhinitis 08/11/2020   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   Tobacco dependence 06/18/2017   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)    Left-sided weakness    Chronic combined systolic (congestive) and  diastolic (congestive) heart failure (HCC) 07/26/2015   Encounter for therapeutic drug monitoring 02/02/2014   Dual implantable cardioverter-defibrillator in situ 06/01/2012   Long term current use of anticoagulant 03/22/2011   Essential hypertension, benign 05/29/2010   Mixed hyperlipidemia 09/26/2009   Peripheral vascular disease (HCC) 06/23/2009   Cardiomyopathy, ischemic 11/29/2008   CEREBROVASCULAR DISEASE 11/29/2008   Anticardiolipin syndrome (HCC) 11/29/2008    Orientation RESPIRATION BLADDER Height & Weight     Time, Self, Situation, Place  Normal Continent Weight: 191 lb 12.8 oz (87 kg) Height:  5' 6 (167.6 cm)  BEHAVIORAL SYMPTOMS/MOOD NEUROLOGICAL BOWEL NUTRITION STATUS      Continent Diet (see dc summary)  AMBULATORY STATUS COMMUNICATION OF NEEDS Skin   Extensive Assist Verbally Other (Comment), PU Stage and Appropriate Care (Pressure Injury Finger Anterior;Left Stage 3; Wound Surgical Closed Surgical Incision Abdomen Left)                       Personal Care Assistance Level of Assistance  Bathing, Feeding, Dressing Bathing Assistance: Limited assistance Feeding assistance: Limited assistance Dressing Assistance: Maximum assistance     Functional Limitations Info  Sight, Hearing, Speech Sight Info: Impaired (eyeglasses) Hearing Info: Adequate Speech Info: Adequate    SPECIAL CARE FACTORS FREQUENCY  PT (By licensed PT), OT (By licensed OT)     PT Frequency: 5x week OT Frequency: 5x week            Contractures Contractures Info: Not present    Additional Factors Info  Code Status, Allergies Code Status Info: Full Allergies Info: Beef-derived Drug Products, Metoclopramide Hcl, Penicillins, Pork-derived Products, Metoclopramide, Metoprolol, Statins  Current Medications (07/06/2024):  This is the current hospital active medication list Current Facility-Administered Medications  Medication Dose Route Frequency Provider Last Rate Last  Admin   0.9 %  sodium chloride  infusion (Manually program via Guardrails IV Fluids)   Intravenous Once Arloa Folks D, NP       alum & mag hydroxide-simeth (MAALOX/MYLANTA) 200-200-20 MG/5ML suspension 30 mL  30 mL Oral Q4H PRN Kallie [name] BROCKS, MD   30 mL at 07/01/24 2113   amiodarone  (PACERONE ) tablet 100 mg  100 mg Oral Once per day on Monday Tuesday Wednesday Thursday Kallie [name] BROCKS, MD   100 mg at 07/06/24 9078   amiodarone  (PACERONE ) tablet 200 mg  200 mg Oral Once per day on Monday Tuesday Wednesday Thursday Kallie [name] BROCKS, MD   200 mg at 07/05/24 2139   amiodarone  (PACERONE ) tablet 200 mg  200 mg Oral 2 times per day on Sunday Friday Saturday Kallie [name] BROCKS, MD   200 mg at 07/04/24 2105   antiseptic oral rinse (BIOTENE) solution 15 mL  15 mL Mouth Rinse PRN Gleason, Leita SAUNDERS, PA-C       bethanechol  (URECHOLINE ) tablet 10 mg  10 mg Oral TID Joseph, Preetha, MD   10 mg at 07/06/24 1127   carvedilol  (COREG ) tablet 3.125 mg  3.125 mg Oral BID WC Joseph, Preetha, MD   3.125 mg at 07/06/24 1049   Chlorhexidine  Gluconate Cloth 2 % PADS 6 each  6 each Topical Q0600 Kallie [name] BROCKS, MD   6 each at 07/06/24 9351   DULoxetine  (CYMBALTA ) DR capsule 60 mg  60 mg Oral Daily Kallie [name] BROCKS, MD   60 mg at 07/06/24 9092   ezetimibe  (ZETIA ) tablet 10 mg  10 mg Oral Daily Kallie [name] BROCKS, MD   10 mg at 07/06/24 9092   feeding supplement (ENSURE PLUS HIGH PROTEIN) liquid 237 mL  237 mL Oral BID BM Kallie [name] BROCKS, MD   237 mL at 07/04/24 1404   mexiletine (MEXITIL ) capsule 300 mg  300 mg Oral TID Kallie [name] BROCKS, MD   300 mg at 07/06/24 9092   midodrine  (PROAMATINE ) tablet 15 mg  15 mg Oral TID WC Johnson, Clanford L, MD   15 mg at 07/06/24 1127   ondansetron  (ZOFRAN ) injection 4 mg  4 mg Intravenous Q6H PRN Kallie [name] BROCKS, MD   4 mg at 07/06/24 0018   oxyCODONE -acetaminophen  (PERCOCET/ROXICET) 5-325 MG per tablet 1 tablet  1 tablet Oral Q8H PRN Kallie [name] BROCKS, MD   1  tablet at 07/06/24 1049   And   oxyCODONE  (Oxy IR/ROXICODONE ) immediate release tablet 5 mg  5 mg Oral Q8H PRN Kallie [name] BROCKS, MD   5 mg at 07/06/24 1049   pantoprazole  (PROTONIX ) EC tablet 40 mg  40 mg Oral BID Johnson, Clanford L, MD   40 mg at 07/06/24 9092   polyethylene glycol (MIRALAX  / GLYCOLAX ) packet 17 g  17 g Oral BID Kallie [name] BROCKS, MD   17 g at 07/06/24 9093   prochlorperazine  (COMPAZINE ) injection 10 mg  10 mg Intravenous Q6H PRN Kallie [name] BROCKS, MD   10 mg at 07/05/24 1315   warfarin (COUMADIN ) tablet 2.5 mg  2.5 mg Oral ONCE-1600 Bitonti, Michael T, [facility]       Warfarin - Pharmacist Dosing Inpatient   Does not apply q1600 Cyndy Ozell DASEN [facility]   Given at 07/05/24 1650     Discharge Medications: Please see discharge summary for a list of  discharge medications.  Relevant Imaging Results:  Relevant Lab Results:   Additional Information SSN  [id number]  Luise JAYSON Pan, LCSWA

## 2024-07-06 NOTE — Progress Notes (Signed)
 R internal jugular CVC removed. Site unremarkable. Pressure dressing applied. Pt instructed to remain flat in bed for 30 min. Dressing to remain intact x24h.

## 2024-07-06 NOTE — Progress Notes (Addendum)
 PROGRESS NOTE    [name] [name]  FMW:[id number] DOB: 08-03-1961 DOA: 06/25/2024 PCP: Lavell Bari LABOR, FNP    63 y.o. 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  Workup noted abdominal wall hematoma,  7/3 : underwent Incision and drainage of hematoma, JP drain placement-Dr.Bridges hospital course notable for persistent hypotension, multifactorial> admission to ICU.  By 7/5 patient remained hypotensive and pressor dependent prompting decision to move to [facility] for further ongoing workup and management  - 7/5 remains pressor dependent, warfarin restarted - 7/7 weaned off pressors, Foley catheter removed - 7/8 transferred to [facility] service, urinary retention required I/O cath  Subjective: -Feels okay, required I/O This morning, denies dyspnea 80  Assessment and Plan:  Abdominal wall hematoma status post fall Acute blood loss anemia Underwent exploration with surgery to rule out abscess 7/3, cultures negative  She does have large bruise on left side of abdomen, left hip region and left thigh JP drain in place, minimal dark bloody output -per Dr.Bridges notes from [facility], can remove JP if output<50/day, last 24h it was 75ml, if she discharges w/ drain she can FU for this Transfuse 1 unit of PRBC this admission -Hemoglobin appears stable, anticoagulation was restarted  Anti phospholipid antibody syndrome -With history of CVA, on warfarin at baseline, supratherapeutic INR on admission and hematoma, acute blood loss anemia -Warfarin resumed 7/5   Persistent hypotension -Multifactorial from acute blood loss anemia, CHF, cardiac meds etc. 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  status post AICD Echocardiogram 06/27/2024 with EF of 35 to 40% and grade 1 diastolic dysfunction -Followed by advanced heart failure team - Prolonged hospital course with persistent hypotension, Coreg , Imdur , Entresto , Jardiance , warfarin on hold - Will restart Coreg  today, resume Jardiance  if urinary retention issues resolve  Paroxysmal A-fib Continue amiodarone , warfarin -Currently in sinus rhythm   Current falls  -Wonder if hypotension is responsible for this, check orthostatics -PT OT consult    Hyponatremia, hypervolemic NA improved to 131 Will give Lasix  40 mg today, assess diuretic needs every day   Left distal radius fracture Patient has hard cast [facility] service contacted Ortho to evaluate if she can come off of this hard cast as it has been rubbing left thumb causing deep ulceration and pressure injury, Ortho dr.Spears suggested FU  Urinary retention Trial of bethanechol  today   DVT prophylaxis: Warfarin Code Status: Full code Family Communication: None present Disposition Plan: Transfer out of ICU  Consultants:    Procedures:   Antimicrobials:    Objective: Vitals:   07/06/24 0630 07/06/24 0700 07/06/24 0800 07/06/24 0812  BP: 125/66 131/72 (!) 103/54   Pulse: 74 83 71   Resp: 16 20 14    Temp:    98.9 F (37.2 C)  TempSrc:    Oral  SpO2: 96% 96% 95%   Weight:      Height:        Intake/Output Summary (Last 24 hours) at 07/06/2024 1011 Last data filed at 07/06/2024 0600 Gross per 24 hour  Intake 240 ml  Output 1805 ml  Net -1565 ml   Filed Weights   06/30/24 0306 07/01/24 0500 07/01/24 0839  Weight: 94.9 kg 94.5 kg 87.1 kg  Examination:  General exam: Appears calm and comfortable  Respiratory system: Decreased breath sounds at the bases Cardiovascular system: S1 & S2 heard, RRR.  Abd: nondistended, soft and nontender.Normal bowel sounds heard. Central nervous system: Alert and oriented. No focal neurological deficits. Extremities: no  edema Skin: No rashes Psychiatry:  Mood & affect appropriate.     Data Reviewed:   CBC: Recent Labs  Lab 06/30/24 0411 07/01/24 0411 07/03/24 0455 07/03/24 2052 07/04/24 0605 07/05/24 0320 07/06/24 0518  WBC 14.6*   < > 13.0* 10.4 10.1 9.5 8.0  NEUTROABS 11.9*  --   --   --   --   --   --   HGB 10.6*   < > 9.1* 9.1* 8.8* 8.3* 8.4*  HCT 31.9*   < > 28.8* 28.1* 27.7* 26.1* 26.2*  MCV 94.4   < > 97.0 96.9 98.2 97.4 97.8  PLT 212   < > 196 233 208 219 219   < > = values in this interval not displayed.   Basic Metabolic Panel: Recent Labs  Lab 07/02/24 0409 07/03/24 0455 07/04/24 0605 07/05/24 0320 07/06/24 0518  NA 131* 127* 131* 132* 132*  K 3.7 3.6 3.9 3.4* 4.0  CL 97* 98 100 98 101  CO2 26 25 24 27 25   GLUCOSE 91 136* 85 83 76  BUN 12 11 7* 9 7*  CREATININE 0.80 0.78 0.78 0.91 0.67  CALCIUM  7.7* 7.5* 7.8* 7.8* 7.8*  MG  --   --   --  1.7 1.9   GFR: Estimated Creatinine Clearance: 82.1 mL/min (by C-G formula based on SCr of 0.67 mg/dL). Liver Function Tests: Recent Labs  Lab 06/30/24 0411  AST 41  ALT 53*  ALKPHOS 71  BILITOT 0.9  PROT 5.8*  ALBUMIN  2.5*   No results for input(s): LIPASE, AMYLASE in the last 168 hours. No results for input(s): AMMONIA in the last 168 hours. Coagulation Profile: Recent Labs  Lab 06/30/24 0411 07/03/24 1905 07/04/24 0605 07/05/24 0320 07/06/24 0518  INR 1.4* 1.4* 1.4* 1.7* 1.9*   Cardiac Enzymes: No results for input(s): CKTOTAL, CKMB, CKMBINDEX, TROPONINI in the last 168 hours. BNP (last 3 results) No results for input(s): PROBNP in the last 8760 hours. HbA1C: No results for input(s): HGBA1C in the last 72 hours. CBG: Recent Labs  Lab 07/05/24 1126  GLUCAP 89   Lipid Profile: No results for input(s): CHOL, HDL, LDLCALC, TRIG, CHOLHDL, LDLDIRECT in the last 72 hours. Thyroid  Function Tests: No results for input(s): TSH, T4TOTAL, FREET4, T3FREE, THYROIDAB in the  last 72 hours. Anemia Panel: No results for input(s): VITAMINB12, FOLATE, FERRITIN, TIBC, IRON, RETICCTPCT in the last 72 hours. Urine analysis:    Component Value Date/Time   COLORURINE YELLOW 06/28/2024 1501   APPEARANCEUR HAZY (A) 06/28/2024 1501   APPEARANCEUR Clear 11/15/2021 1321   LABSPEC 1.012 06/28/2024 1501   PHURINE 5.0 06/28/2024 1501   GLUCOSEU >=500 (A) 06/28/2024 1501   HGBUR NEGATIVE 06/28/2024 1501   BILIRUBINUR NEGATIVE 06/28/2024 1501   BILIRUBINUR Negative 11/15/2021 1321   KETONESUR NEGATIVE 06/28/2024 1501   PROTEINUR 30 (A) 06/28/2024 1501   UROBILINOGEN 0.2 12/23/2011 1440   NITRITE NEGATIVE 06/28/2024 1501   LEUKOCYTESUR NEGATIVE 06/28/2024 1501   Sepsis Labs: @LABRCNTIP (procalcitonin:4,lacticidven:4)  ) Recent Results (from the past 240 hours)  Culture, blood (Routine X 2) w Reflex to ID Panel     Status: None   Collection Time: 06/28/24  7:04 PM   Specimen: BLOOD  Result Value  Ref Range Status   Specimen Description BLOOD BLOOD RIGHT HAND  Final   Special Requests   Final    BOTTLES DRAWN AEROBIC ONLY Blood Culture results may not be optimal due to an inadequate volume of blood received in culture bottles   Culture   Final    NO GROWTH 5 DAYS Performed at [facility], [address], [place], [place] [address]    Report Status 07/03/2024 FINAL  Final  Culture, blood (Routine X 2) w Reflex to ID Panel     Status: None   Collection Time: 06/28/24  8:05 PM   Specimen: BLOOD  Result Value Ref Range Status   Specimen Description BLOOD BLOOD RIGHT HAND  Final   Special Requests   Final    BOTTLES DRAWN AEROBIC ONLY Blood Culture results may not be optimal due to an inadequate volume of blood received in culture bottles   Culture   Final    NO GROWTH 5 DAYS Performed at [facility], [address], [place], [place] [address]    Report Status 07/03/2024 FINAL  Final  Aerobic/Anaerobic Culture w Gram Stain (surgical/deep wound)      Status: None (Preliminary result)   Collection Time: 07/01/24 11:52 AM   Specimen: Abdomen; Abscess  Result Value Ref Range Status   Specimen Description   Final    WOUND Performed at [facility], [address] [place], [place] [address]    Special Requests LEFT ABD WALL ABSC VS HEMATOMA  Final   Gram Stain NO WBC SEEN NO ORGANISMS SEEN   Final   Culture   Final    NO GROWTH 4 DAYS NO ANAEROBES ISOLATED; CULTURE IN PROGRESS FOR 5 DAYS Performed at [facility], [address] [place], [place] [address]    Report Status PENDING  Incomplete     Radiology Studies: No results found.   Scheduled Meds:  sodium chloride    Intravenous Once   amiodarone   100 mg Oral Once per day on Monday Tuesday Wednesday Thursday   amiodarone   200 mg Oral Once per day on Monday Tuesday Wednesday Thursday   amiodarone   200 mg Oral 2 times per day on Sunday Friday Saturday   bethanechol   10 mg Oral TID   Chlorhexidine  Gluconate Cloth  6 each Topical Q0600   DULoxetine   60 mg Oral Daily   ezetimibe   10 mg Oral Daily   feeding supplement  237 mL Oral BID BM   mexiletine  300 mg Oral TID   midodrine   15 mg Oral TID WC   pantoprazole   40 mg Oral BID   polyethylene glycol  17 g Oral BID   Warfarin - Pharmacist Dosing Inpatient   Does not apply q1600   Continuous Infusions:   LOS: 10 days    Time spent:    Sigurd Pac, MD [facility]   07/06/2024, 10:11 AM

## 2024-07-06 NOTE — Progress Notes (Signed)
 PHARMACY - ANTICOAGULATION CONSULT NOTE  Pharmacy Consult for warfarin Indication: VTE  Allergies  Allergen Reactions   Beef-Derived Drug Products Anaphylaxis and Nausea Only    From tick bite   Metoclopramide Hcl Anaphylaxis    Non responsive   Penicillins Anaphylaxis    Shock Immediate rash, facial/tongue/throat swelling, SOB or lightheadedness with hypotension   Pork-Derived Products Anaphylaxis and Nausea Only    From tick bite. Has tolerated heparin /lovenox    Metoclopramide Other (See Comments)    Unknown    Metoprolol Other (See Comments)    Syncope    Statins Other (See Comments)    Myalgias    Patient Measurements: Height: 5' 6 (167.6 cm) Weight: 87.1 kg (192 lb) IBW/kg (Calculated) : 59.3 HEPARIN  DW (KG): 78  Vital Signs: Temp: 98.9 F (37.2 C) (07/08 0812) Temp Source: Oral (07/08 0812) BP: 103/54 (07/08 0800) Pulse Rate: 71 (07/08 0800)  Labs: Recent Labs    07/04/24 0605 07/05/24 0320 07/06/24 0518  HGB 8.8* 8.3* 8.4*  HCT 27.7* 26.1* 26.2*  PLT 208 219 219  LABPROT 18.2* 20.6* 22.8*  INR 1.4* 1.7* 1.9*  CREATININE 0.78 0.91 0.67    Estimated Creatinine Clearance: 82.1 mL/min (by C-G formula based on SCr of 0.67 mg/dL).   Medical History: Past Medical History:  Diagnosis Date   AICD (automatic cardioverter/defibrillator) present    Aneurysm of internal carotid artery    Anti-phospholipid syndrome (HCC)    Anticardiolipin antibody positive    Chronic Coumadin    Brain aneurysm    followed by Dr. Dolphus   Chronic combined systolic (congestive) and diastolic (congestive) heart failure (HCC)    Cocaine abuse in remission [facility])    Coronary atherosclerosis of native coronary artery    Previous invasive cardiac testing was done at a facility in [place], [place].  Occluded diagonal, Diffuse LAD disease, Occluded Cx, and non obstructive RCA.    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 Pharmacy to resume warfarin.   Home regimen is 5mg  MWF, 2.5mg  TTSS > of note INR was 6.3 on admission requiring vit K.  INR today is subtherapeutic at 1.9 after warfarin restarted on 7/5 but trending up.  Goal of Therapy:  INR 2-3 Monitor platelets by anticoagulation protocol: Yes   Plan:  Warfarin 2.5 mg PO x1 tonight Daily INR   Ozell Jamaica, PharmD, BCPS, [facility] Clinical Pharmacist [phone] Please check AMION for all [facility] Pharmacy numbers 07/06/2024

## 2024-07-06 NOTE — Progress Notes (Signed)
 [facility] ADULT ICU REPLACEMENT PROTOCOL   The patient does apply for the [facility] Adult ICU Electrolyte Replacment Protocol based on the criteria listed below:   1.Exclusion criteria: TCTS, ECMO, Dialysis, and Myasthenia Gravis patients 2. Is GFR >/= 30 ml/min? Yes.    Patient's GFR today is >60 3. Is SCr </= 2? Yes.   Patient's SCr is 0.91 mg/dL 4. Did SCr increase >/= 0.5 in 24 hours? No. 5.Pt's weight >40kg  Yes.   6. Abnormal electrolyte(s): K+ 3.4, Mag 1.7  7. Electrolytes replaced per protocol 8.  Call MD STAT for K+ </= 2.5, Phos </= 1, or Mag </= 1 Physician:  Dr. Rober Rummer, [place] ORN 07/06/2024 5:37 AM

## 2024-07-06 NOTE — Progress Notes (Signed)
 Physical Therapy Treatment Patient Details Name: [name] [name] MRN: [id number] DOB: 1961-02-15 Today's Date: 07/06/2024   History of Present Illness 63 y.o. female admitted to [facility] s/p fall with abdominal wall injury pt with nausea and vomiting limiting intake with recent start to [place] . 7/2 moved to [facility] HTN and pressor dependent  7/3 JP drain L abdomen placed PMH antiphospholipid syndrome on warfarin, CHF, A-fib, vtach vfib with ICD shock, CVA, CAD with MI, back surg 5 dis fusion 2010    PT Comments  Pt tolerated mobility progressions well, requiring less assistance for mobility tasks than in previous session. Pt was able to ambulate short room level distances w/ eva walker and no physical assistance. Pt  would benefit from further transfer and gait training. PT will continue to treat pt while she is admitted. Patient will benefit from continued inpatient follow up therapy, <3 hours/day    If plan is discharge home, recommend the following: Help with stairs or ramp for entrance;Assistance with cooking/housework;A lot of help with bathing/dressing/bathroom;A lot of help with walking and/or transfers   Can travel by private vehicle     No  Equipment Recommendations  None recommended by PT    Recommendations for Other Services       Precautions / Restrictions Precautions Precautions: Fall Recall of Precautions/Restrictions: Intact Precaution/Restrictions Comments: JP drain L abdomen Required Braces or Orthoses: Splint/Cast Splint/Cast: L forearm - admitted with cast noted to have wound at thumb. See media image 07/03/24 Restrictions Weight Bearing Restrictions Per Provider Order: Yes LUE Weight Bearing Per Provider Order: Weight bear through elbow only     Mobility  Bed Mobility Overal bed mobility: Needs Assistance Bed Mobility: Sit to Supine, Supine to Sit     Supine to sit: Min assist, HOB elevated Sit to supine: Contact guard assist, HOB elevated, Used rails   General bed  mobility comments: Pt able to pull to sit w/ min A for sup>sit. VC given for sequencing; increased time to complete.    Transfers Overall transfer level: Needs assistance Equipment used: Ambulation equipment used (eva walker) Transfers: Sit to/from Stand Sit to Stand: Min assist, +2 physical assistance           General transfer comment: STS from EOB w/ eva walker and min A +2 for initial powerup. VC given for sequencing, upper and lower extremity placement; increased time to complete.    Ambulation/Gait Ambulation/Gait assistance: Contact guard assist Gait Distance (Feet): 14 Feet (6', 8') Assistive device: Elyn Finder Gait Pattern/deviations: Step-through pattern, Decreased stride length Gait velocity: reduced Gait velocity interpretation: <1.8 ft/sec, indicate of risk for recurrent falls   General Gait Details: Pt demonstrates reciprocal gait pattern and reduced cadence, w/ increased reliance on walker for stability.   Stairs             Wheelchair Mobility     Tilt Bed    Modified Rankin (Stroke Patients Only)       Balance Overall balance assessment: Needs assistance Sitting-balance support: No upper extremity supported, Feet supported Sitting balance-Leahy Scale: Fair Sitting balance - Comments: seated EOB   Standing balance support: Bilateral upper extremity supported, During functional activity, Reliant on assistive device for balance Standing balance-Leahy Scale: Poor Standing balance comment: reliant on external support                            Communication Communication Communication: No apparent difficulties  Cognition Arousal: Alert Behavior During Therapy: Anxious  PT - Cognitive impairments: No apparent impairments                         Following commands: Intact      Cueing Cueing Techniques: Verbal cues, Visual cues  Exercises      General Comments General comments (skin integrity, edema, etc.): no  signs of acute distress      Pertinent Vitals/Pain Pain Assessment Pain Assessment: No/denies pain Pain Score: 0-No pain Pain Intervention(s): Monitored during session    Home Living                          Prior Function            PT Goals (current goals can now be found in the care plan section) Acute Rehab PT Goals Patient Stated Goal: To walk with confidence PT Goal Formulation: With patient Time For Goal Achievement: 07/19/24 Potential to Achieve Goals: Good Progress towards PT goals: Progressing toward goals    Frequency    Min 2X/week      PT Plan      Co-evaluation              AM-PAC PT 6 Clicks Mobility   Outcome Measure  Help needed turning from your back to your side while in a flat bed without using bedrails?: None Help needed moving from lying on your back to sitting on the side of a flat bed without using bedrails?: A Little Help needed moving to and from a bed to a chair (including a wheelchair)?: A Little Help needed standing up from a chair using your arms (e.g., wheelchair or bedside chair)?: A Lot Help needed to walk in hospital room?: A Little Help needed climbing 3-5 steps with a railing? : Total 6 Click Score: 16    End of Session Equipment Utilized During Treatment: Gait belt Activity Tolerance: Patient tolerated treatment well Patient left: in bed;with call bell/phone within reach;with bed alarm set Nurse Communication: Mobility status PT Visit Diagnosis: Unsteadiness on feet (R26.81);Repeated falls (R29.6);Muscle weakness (generalized) (M62.81)     Time: 8664-8642 PT Time Calculation (min) (ACUTE ONLY): 22 min  Charges:    $Gait Training: 8-22 mins PT General Charges $$ ACUTE PT VISIT: 1 Visit                     Leontine Hilt, SPT Acute Rehab [phone]    Leontine Hilt 07/06/2024, 4:23 PM

## 2024-07-06 NOTE — Plan of Care (Signed)
   Problem: Education: Goal: Knowledge of General Education information will improve Description Including pain rating scale, medication(s)/side effects and non-pharmacologic comfort measures Outcome: Progressing

## 2024-07-07 ENCOUNTER — Other Ambulatory Visit (HOSPITAL_COMMUNITY): Payer: Self-pay | Admitting: Cardiology

## 2024-07-07 ENCOUNTER — Other Ambulatory Visit: Payer: Self-pay | Admitting: Cardiology

## 2024-07-07 DIAGNOSIS — I5042 Chronic combined systolic (congestive) and diastolic (congestive) heart failure: Secondary | ICD-10-CM

## 2024-07-07 DIAGNOSIS — S301XXD Contusion of abdominal wall, subsequent encounter: Secondary | ICD-10-CM | POA: Diagnosis not present

## 2024-07-07 DIAGNOSIS — E440 Moderate protein-calorie malnutrition: Secondary | ICD-10-CM | POA: Insufficient documentation

## 2024-07-07 LAB — TYPE AND SCREEN
ABO/RH(D): A POS
Antibody Screen: NEGATIVE
Unit division: 0
Unit division: 0

## 2024-07-07 LAB — CBC
HCT: 29.1 % — ABNORMAL LOW (ref 36.0–46.0)
Hemoglobin: 9.3 g/dL — ABNORMAL LOW (ref 12.0–15.0)
MCH: 30.5 pg (ref 26.0–34.0)
MCHC: 32 g/dL (ref 30.0–36.0)
MCV: 95.4 fL (ref 80.0–100.0)
Platelets: 234 K/uL (ref 150–400)
RBC: 3.05 MIL/uL — ABNORMAL LOW (ref 3.87–5.11)
RDW: 14.8 % (ref 11.5–15.5)
WBC: 8.4 K/uL (ref 4.0–10.5)
nRBC: 0.2 % (ref 0.0–0.2)

## 2024-07-07 LAB — BASIC METABOLIC PANEL WITH GFR
Anion gap: 7 (ref 5–15)
BUN: 7 mg/dL — ABNORMAL LOW (ref 8–23)
CO2: 25 mmol/L (ref 22–32)
Calcium: 7.9 mg/dL — ABNORMAL LOW (ref 8.9–10.3)
Chloride: 100 mmol/L (ref 98–111)
Creatinine, Ser: 0.69 mg/dL (ref 0.44–1.00)
GFR, Estimated: >60 mL/min (ref >60)
Glucose, Bld: 81 mg/dL (ref 70–99)
Potassium: 4.4 mmol/L (ref 3.5–5.1)
Sodium: 132 mmol/L — ABNORMAL LOW (ref 135–145)

## 2024-07-07 LAB — BPAM RBC
Blood Product Expiration Date: 202507292359
Blood Product Expiration Date: 202508012359
Unit Type and Rh: 6200
Unit Type and Rh: 6200

## 2024-07-07 LAB — PROTIME-INR
INR: 2.1 — ABNORMAL HIGH (ref 0.8–1.2)
Prothrombin Time: 24.9 s — ABNORMAL HIGH (ref 11.4–15.2)

## 2024-07-07 MED ORDER — ADULT MULTIVITAMIN W/MINERALS CH
1.0000 | ORAL_TABLET | Freq: Every day | ORAL | Status: DC
  Administered 2024-07-11: 1 via ORAL
  Filled 2024-07-07 (×3): qty 1

## 2024-07-07 MED ORDER — ONDANSETRON 4 MG PO TBDP
4.0000 mg | ORAL_TABLET | Freq: Three times a day (TID) | ORAL | Status: DC | PRN
  Administered 2024-07-07 – 2024-07-08 (×2): 4 mg via ORAL
  Filled 2024-07-07 (×2): qty 1

## 2024-07-07 MED ORDER — WARFARIN SODIUM 5 MG PO TABS
5.0000 mg | ORAL_TABLET | Freq: Once | ORAL | Status: AC
  Administered 2024-07-07: 5 mg via ORAL
  Filled 2024-07-07: qty 1

## 2024-07-07 MED ORDER — PROSOURCE PLUS PO LIQD
30.0000 mL | Freq: Two times a day (BID) | ORAL | Status: DC
  Administered 2024-07-08 – 2024-07-11 (×5): 30 mL via ORAL
  Filled 2024-07-07 (×7): qty 30

## 2024-07-07 MED ORDER — MIDODRINE HCL 5 MG PO TABS
10.0000 mg | ORAL_TABLET | Freq: Three times a day (TID) | ORAL | Status: DC
  Administered 2024-07-08 – 2024-07-10 (×7): 10 mg via ORAL
  Filled 2024-07-07 (×6): qty 2

## 2024-07-07 MED ORDER — PROMETHAZINE HCL 12.5 MG PO TABS
12.5000 mg | ORAL_TABLET | Freq: Four times a day (QID) | ORAL | Status: DC | PRN
  Filled 2024-07-07: qty 1

## 2024-07-07 MED ORDER — ONDANSETRON 4 MG PO TBDP: 4.0000 mg | ORAL_TABLET | Freq: Three times a day (TID) | ORAL | Status: DC | PRN

## 2024-07-07 NOTE — Progress Notes (Signed)
 Physical Therapy Treatment Patient Details Name: [name] [name] MRN: [id number] DOB: Dec 07, 1961 Today's Date: 07/07/2024   History of Present Illness 63 y.o. female admitted to [facility] s/p fall with abdominal wall injury pt with nausea and vomiting limiting intake with recent start to [facility] . 7/2 moved to [facility] HTN and pressor dependent  7/3 JP drain L abdomen placed PMH antiphospholipid syndrome on warfarin, CHF, A-fib, vtach vfib with ICD shock, CVA, CAD with MI, back surg 5 dis fusion 2010    PT Comments  Pt supine in bed on arrival.  He forearm cast on L side continues to cut into her thumb, wound appears macerated and ortho tech called to trim cast back to allow for improved dressing on her L thumb.  Focused on gt training and transfer activities with cues for increasing BOS to improve balance.  Pt remains anxious and fearful this session but more confident with 2 therapist present to improve gt training distance.   If plan is discharge home, recommend the following: Help with stairs or ramp for entrance;Assistance with cooking/housework;A lot of help with bathing/dressing/bathroom;A lot of help with walking and/or transfers   Can travel by private vehicle     No  Equipment Recommendations  None recommended by PT    Recommendations for Other Services       Precautions / Restrictions Precautions Precautions: Fall Recall of Precautions/Restrictions: Intact Precaution/Restrictions Comments: JP drain L abdomen Required Braces or Orthoses: Splint/Cast Splint/Cast: L forearm - admitted with cast noted to have wound at thumb. See media image 07/03/24 (Ortho into trim back cast for improved mobility of thumb and reduce pressure on wound created by cast) Restrictions Weight Bearing Restrictions Per Provider Order: Yes LUE Weight Bearing Per Provider Order: Weight bear through elbow only Other Position/Activity Restrictions: use platform walker for LUE     Mobility  Bed Mobility Overal bed  mobility: Needs Assistance Bed Mobility: Sit to Supine, Supine to Sit     Supine to sit: Min assist Sit to supine: Contact guard assist, HOB elevated, Used rails   General bed mobility comments: Pt able to pull to sit w/ min A for sup>sit. VC given for sequencing; increased time to complete.    Transfers Overall transfer level: Needs assistance Equipment used: Ambulation equipment used, Bilateral platform walker (EVA walker) Transfers: Sit to/from Stand Sit to Stand: Min assist, +2 safety/equipment, From elevated surface           General transfer comment: Cues for hand placement to and from seated surface    Ambulation/Gait Ambulation/Gait assistance: Min assist, +2 safety/equipment Gait Distance (Feet): 20 Feet (+ 51ft) Assistive device: Elyn Finder Gait Pattern/deviations: Step-through pattern, Decreased stride length, Narrow base of support Gait velocity: reduced     General Gait Details: Cues to increase BOS this session.  Pt required assistance to increase B stride length and steer EVA walker.   Stairs             Wheelchair Mobility     Tilt Bed    Modified Rankin (Stroke Patients Only)       Balance Overall balance assessment: Needs assistance Sitting-balance support: No upper extremity supported, Feet supported Sitting balance-Leahy Scale: Fair Sitting balance - Comments: seated EOB   Standing balance support: Bilateral upper extremity supported, During functional activity, Reliant on assistive device for balance Standing balance-Leahy Scale: Poor Standing balance comment: reliant on external support  Communication Communication Communication: No apparent difficulties  Cognition Arousal: Alert Behavior During Therapy: Anxious   PT - Cognitive impairments: No apparent impairments                         Following commands: Intact      Cueing Cueing Techniques: Verbal cues, Visual cues   Exercises      General Comments        Pertinent Vitals/Pain Pain Assessment Pain Assessment: Faces Faces Pain Scale: Hurts little more Pain Location: lower back, front of stomach lower back. L thumb Pain Descriptors / Indicators: Discomfort Pain Intervention(s): Monitored during session, Repositioned    Home Living                          Prior Function            PT Goals (current goals can now be found in the care plan section) Acute Rehab PT Goals Patient Stated Goal: To walk with confidence Potential to Achieve Goals: Good Progress towards PT goals: Progressing toward goals    Frequency    Min 2X/week      PT Plan      Co-evaluation PT/OT/SLP Co-Evaluation/Treatment: Yes Reason for Co-Treatment: To address functional/ADL transfers PT goals addressed during session: Mobility/safety with mobility;Balance;Proper use of DME OT goals addressed during session: ADL's and self-care      AM-PAC PT 6 Clicks Mobility   Outcome Measure  Help needed turning from your back to your side while in a flat bed without using bedrails?: None Help needed moving from lying on your back to sitting on the side of a flat bed without using bedrails?: A Little Help needed moving to and from a bed to a chair (including a wheelchair)?: A Little Help needed standing up from a chair using your arms (e.g., wheelchair or bedside chair)?: A Little Help needed to walk in hospital room?: A Lot Help needed climbing 3-5 steps with a railing? : A Lot 6 Click Score: 17    End of Session Equipment Utilized During Treatment: Gait belt Activity Tolerance: Patient tolerated treatment well Patient left: in bed;with call bell/phone within reach Nurse Communication: Mobility status PT Visit Diagnosis: Unsteadiness on feet (R26.81);Repeated falls (R29.6);Muscle weakness (generalized) (M62.81)     Time: 8894-8796 PT Time Calculation (min) (ACUTE ONLY): 58 min  Charges:     $Gait Training: 8-22 mins $Therapeutic Activity: 8-22 mins PT General Charges $$ ACUTE PT VISIT: 1 Visit                     Toya HAMS , PTA Acute Rehabilitation Services Office [phone]    Toya JINNY Gosling 07/07/2024, 12:17 PM

## 2024-07-07 NOTE — Progress Notes (Signed)
 Pt reports pain with IV flush, IV looks blown (redness and swelling at IV site). Pt requests IV team b/c she says she's a hard stick. Zofran  Not Given - IV blown.  RFA IV blown.  Velinda Server, RN 07/07/2024 09:55 AM

## 2024-07-07 NOTE — Progress Notes (Signed)
 PHARMACY - ANTICOAGULATION CONSULT NOTE  Pharmacy Consult for warfarin Indication: VTE  Allergies  Allergen Reactions   Beef-Derived Drug Products Anaphylaxis and Nausea Only    From tick bite   Metoclopramide Hcl Anaphylaxis    Non responsive   Penicillins Anaphylaxis    Shock Immediate rash, facial/tongue/throat swelling, SOB or lightheadedness with hypotension   Pork-Derived Products Anaphylaxis and Nausea Only    From tick bite. Has tolerated heparin /lovenox    Metoclopramide Other (See Comments)    Unknown    Metoprolol Other (See Comments)    Syncope    Statins Other (See Comments)    Myalgias    Patient Measurements: Height: 5' 6 (167.6 cm) Weight: 95.3 kg (210 lb 1.6 oz) IBW/kg (Calculated) : 59.3 HEPARIN  DW (KG): 78  Vital Signs: Temp: 98.2 F (36.8 C) (07/09 1208) Temp Source: Oral (07/09 1208) BP: 128/68 (07/09 1208) Pulse Rate: 79 (07/09 1208)  Labs: Recent Labs    07/05/24 0320 07/06/24 0518 07/07/24 0256  HGB 8.3* 8.4* 9.3*  HCT 26.1* 26.2* 29.1*  PLT 219 219 234  LABPROT 20.6* 22.8* 24.9*  INR 1.7* 1.9* 2.1*  CREATININE 0.91 0.67 0.69    Estimated Creatinine Clearance: 85.9 mL/min (by C-G formula based on SCr of 0.69 mg/dL).   Medical History: Past Medical History:  Diagnosis Date   AICD (automatic cardioverter/defibrillator) present    Aneurysm of internal carotid artery    Anti-phospholipid syndrome (HCC)    Anticardiolipin antibody positive    Chronic Coumadin    Brain aneurysm    followed by Dr. 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  Occluded diagonal, Diffuse LAD disease, Occluded Cx, and non obstructive RCA.    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 Pharmacy to resume warfarin.   Home regimen is 5mg  MWF, 2.5mg  TTSS > of note INR was 6.3 on admission requiring vit K.  INR today is herapeutic at 2.1 after warfarin restarted on 7/5.  Goal of Therapy:  INR 2-3 Monitor platelets by anticoagulation protocol: Yes   Plan:  Warfarin 5 mg PO x1 tonight Daily INR   Harlene Barlow, Berdine BIRCH, BCPS, [facility] Clinical Pharmacist  07/07/2024 1:18 PM   [facility] pharmacy phone numbers are listed on [email or web address]

## 2024-07-07 NOTE — Plan of Care (Signed)
   Problem: Nutrition: Goal: Adequate nutrition will be maintained Outcome: Completed/Met   Problem: Elimination: Goal: Will not experience complications related to urinary retention Outcome: Completed/Met

## 2024-07-07 NOTE — Plan of Care (Signed)
 Pt pain is controlled with current orders

## 2024-07-07 NOTE — Progress Notes (Signed)
 VAST consult received to obtain IV access. Upon arrival at bedside, noted PIV in place in right anterior forearm with mild swelling; pt verbalized previous burning with NS flush and current tenderness with touch. PIV dc'd. Pt's lower left arm in cast; pt verbalized it has healed and she is due to have cast removed. Assessed patient's right arm with ultrasound; small vessel located in lower proximal forearm, but patient initially requested it not be used d/t to close proximity to [facility]. Right upper cephalic vein non-compressible. Right upper basilic too deep for midline or USGIV. Attempted to place midline in upper brachial vein but was unsuccessful. Assessed patient's upper left arm with ultrasound at her request. Left cephalic vein too small for cannulation, left brachial and basilic veins too deep for midline.  Patient agreed to placement of PIV in right anterior forearm as this is only option left at this time.  Informed patient and patient's nurse that if further access is needed, a central line or PICC is recommended d/t poor vasculature.

## 2024-07-07 NOTE — Progress Notes (Signed)
 PROGRESS NOTE    [name] [name]  FMW:[id number] DOB: [birth year] DOA: 06/25/2024 PCP: Lavell Bari LABOR, FNP    63 y.o. 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  Workup noted abdominal wall hematoma,  7/3 : underwent Incision and drainage of hematoma, JP drain placement-Dr.Bridges hospital course notable for persistent hypotension, multifactorial> admission to ICU.  By 7/5 patient remained hypotensive and pressor dependent prompting decision to move to [facility] for further ongoing workup and management  - 7/5 remains pressor dependent, warfarin restarted - 7/7 weaned off pressors, Foley catheter removed - 7/8 transferred to [facility] service, urinary retention required I/O cath  Subjective: Feeling better.  Strength improving.  No dizziness or lightheadedness.  Swelling in the legs present.  Drain output increased  Assessment and Plan:  Abdominal wall hematoma status post fall Acute blood loss anemia Underwent exploration with surgery to rule out abscess 7/3, cultures negative  She does have large bruise on left side of abdomen, left hip region and left thigh JP drain in place, minimal dark bloody output -per Dr.Bridges notes from [facility], can remove JP if output<50/day, would ideally like to wait for drain removal once her JP output is less than 50 mL/day for 2 days in a row given significant fluctuation noted. Transfuse 1 unit of PRBC this admission -Hemoglobin appears stable, anticoagulation was restarted  Anti phospholipid antibody syndrome -With history of CVA, on warfarin at baseline, supratherapeutic INR on admission and hematoma, acute blood loss anemia -Warfarin resumed 7/5   Persistent hypotension -Multifactorial from acute blood loss anemia, CHF, cardiac meds etc. 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  Paroxysmal A-fib Continue amiodarone , warfarin -Currently in sinus rhythm   Recurrent falls  -Wonder if hypotension is responsible for this, check orthostatics -PT OT consult    Hyponatremia, hypervolemic NA improved to 131 Hold off on diuresis.  Resume tomorrow.   Left distal radius fracture Patient has hard cast [facility] service contacted Ortho to evaluate if she can come off of this hard cast as it has been rubbing left thumb causing deep ulceration and pressure injury, Ortho dr.Spears suggested FU.  Ortho tech cut a piece of the cast.  I have not heard back from Dr. Alyse office yet.  Urinary retention Monitor.   DVT prophylaxis: Warfarin Code Status: Full code Family Communication: None present Disposition Plan: Transfer out of ICU  Consultants:    Procedures:   Antimicrobials:    Objective: Vitals:   07/07/24 0745 07/07/24 1208 07/07/24 1515 07/07/24 1800  BP: (!) 104/50 128/68 131/76 113/65  Pulse: 71 79 84 72  Resp: 11 19    Temp: 98.7 F (37.1 C) 98.2 F (36.8 C)    TempSrc: Oral Oral Oral   SpO2: 93% 91%  95%  Weight:      Height:        Intake/Output Summary (Last 24 hours) at 07/07/2024 1933 Last data filed at 07/07/2024 1826 Gross per 24 hour  Intake 238 ml  Output 430 ml  Net -192 ml   Filed Weights   07/01/24 0839 07/06/24 1248 07/07/24 0400  Weight: 87.1 kg 87 kg 95.3 kg    Examination:  Clear to auscultation. S1-S2 present. Bilateral lower extremity edema. Mild abdominal diffuse tenderness.  Data Reviewed:   CBC: Recent Labs  Lab  07/03/24 2052 07/04/24 0605 07/05/24 0320 07/06/24 0518 07/07/24 0256  WBC 10.4 10.1 9.5 8.0 8.4  HGB 9.1* 8.8* 8.3* 8.4* 9.3*  HCT 28.1* 27.7* 26.1* 26.2* 29.1*  MCV 96.9 98.2 97.4 97.8 95.4  PLT 233 208 219 219 234   Basic Metabolic Panel: Recent Labs  Lab 07/03/24 0455 07/04/24 0605 07/05/24 0320 07/06/24 0518 07/07/24 0256  NA 127* 131* 132* 132* 132*  K 3.6 3.9 3.4* 4.0 4.4  CL 98 100 98 101 100  CO2 25 24 27 25 25   GLUCOSE 136* 85 83 76 81  BUN 11 7* 9 7* 7*  CREATININE 0.78 0.78 0.91 0.67 0.69  CALCIUM  7.5* 7.8* 7.8* 7.8* 7.9*  MG  --   --  1.7 1.9  --    GFR: Estimated Creatinine Clearance: 85.9 mL/min (by C-G formula based on SCr of 0.69 mg/dL). Liver Function Tests: No results for input(s): AST, ALT, ALKPHOS, BILITOT, PROT, ALBUMIN  in the last 168 hours.  No results for input(s): LIPASE, AMYLASE in the last 168 hours. No results for input(s): AMMONIA in the last 168 hours. Coagulation Profile: Recent Labs  Lab 07/03/24 1905 07/04/24 0605 07/05/24 0320 07/06/24 0518 07/07/24 0256  INR 1.4* 1.4* 1.7* 1.9* 2.1*   Cardiac Enzymes: No results for input(s): CKTOTAL, CKMB, CKMBINDEX, TROPONINI in the last 168 hours. BNP (last 3 results) No results for input(s): PROBNP in the last 8760 hours. HbA1C: No results for input(s): HGBA1C in the last 72 hours. CBG: Recent Labs  Lab 07/05/24 1126  GLUCAP 89   Lipid Profile: No results for input(s): CHOL, HDL, LDLCALC, TRIG, CHOLHDL, LDLDIRECT in the last 72 hours. Thyroid  Function Tests: No results for input(s): TSH, T4TOTAL, FREET4, T3FREE, THYROIDAB in the last 72 hours. Anemia Panel: No results for input(s): VITAMINB12, FOLATE, FERRITIN, TIBC, IRON, RETICCTPCT in the last 72 hours. Urine analysis:    Component Value Date/Time   COLORURINE YELLOW 06/28/2024 1501   APPEARANCEUR HAZY (A) 06/28/2024 1501   APPEARANCEUR Clear 11/15/2021 1321    LABSPEC 1.012 06/28/2024 1501   PHURINE 5.0 06/28/2024 1501   GLUCOSEU >=500 (A) 06/28/2024 1501   HGBUR NEGATIVE 06/28/2024 1501   BILIRUBINUR NEGATIVE 06/28/2024 1501   BILIRUBINUR Negative 11/15/2021 1321   KETONESUR NEGATIVE 06/28/2024 1501   PROTEINUR 30 (A) 06/28/2024 1501   UROBILINOGEN 0.2 12/23/2011 1440   NITRITE NEGATIVE 06/28/2024 1501   LEUKOCYTESUR NEGATIVE 06/28/2024 1501   Sepsis Labs: @LABRCNTIP (procalcitonin:4,lacticidven:4)  ) Recent Results (from the past 240 hours)  Culture, blood (Routine X 2) w Reflex to ID Panel     Status: None   Collection Time: 06/28/24  7:04 PM   Specimen: BLOOD  Result Value Ref Range Status   Specimen Description BLOOD BLOOD RIGHT HAND  Final   Special Requests   Final    BOTTLES DRAWN AEROBIC ONLY Blood Culture results may not be optimal due to an inadequate volume of blood received in culture bottles   Culture   Final    NO GROWTH 5 DAYS Performed at [facility], [address], [place], [place] [address]    Report Status 07/03/2024 FINAL  Final  Culture, blood (Routine X 2)  w Reflex to ID Panel     Status: None   Collection Time: 06/28/24  8:05 PM   Specimen: BLOOD  Result Value Ref Range Status   Specimen Description BLOOD BLOOD RIGHT HAND  Final   Special Requests   Final    BOTTLES DRAWN AEROBIC ONLY Blood Culture results may not be optimal due to an inadequate volume of blood received in culture bottles   Culture   Final    NO GROWTH 5 DAYS Performed at [facility], [address], [place], [place] [address]    Report Status 07/03/2024 FINAL  Final  Aerobic/Anaerobic Culture w Gram Stain (surgical/deep wound)     Status: None   Collection Time: 07/01/24 11:52 AM   Specimen: Abdomen; Abscess  Result Value Ref Range Status   Specimen Description   Final    WOUND Performed at [facility], [address], [place], [place] [address]    Special Requests LEFT ABD WALL ABSC VS HEMATOMA  Final   Gram Stain NO WBC  SEEN NO ORGANISMS SEEN   Final   Culture   Final    No growth aerobically or anaerobically. Performed at [facility], [address], [place], [place] [address]    Report Status 07/06/2024 FINAL  Final     Radiology Studies: No results found.   Scheduled Meds:  [START ON 07/08/2024] (feeding supplement) PROSource Plus  30 mL Oral BID BM   amiodarone   100 mg Oral Once per day on Monday Tuesday Wednesday Thursday   amiodarone   200 mg Oral Once per day on Monday Tuesday Wednesday Thursday   amiodarone   200 mg Oral 2 times per day on Sunday Friday Saturday   carvedilol   3.125 mg Oral BID WC   Chlorhexidine  Gluconate Cloth  6 each Topical Q0600   DULoxetine   60 mg Oral Daily   ezetimibe   10 mg Oral Daily   mexiletine  300 mg Oral TID   [START ON 07/08/2024] midodrine   10 mg Oral TID WC   multivitamin with minerals  1 tablet Oral Daily   pantoprazole   40 mg Oral BID   polyethylene glycol  17 g Oral BID   Warfarin - Pharmacist Dosing Inpatient   Does not apply q1600   Continuous Infusions:   LOS: 11 days    Time spent: Author: Yetta Blanch, MD  [facility] 07/07/2024  7:36 PM Between 7PM-7AM, please contact night-coverage, check [email or web address] for on call.

## 2024-07-07 NOTE — Evaluation (Signed)
 Occupational Therapy Evaluation Patient Details Name: [name] [name] MRN: [id number] DOB: 1961/04/20 Today's Date: 07/07/2024   History of Present Illness   63 y.o. female admitted to [facility] s/p fall with abdominal wall injury pt with nausea and vomiting limiting intake with recent start to Wegovy . 7/2 moved to [facility] HTN and pressor dependent  7/3 JP drain L abdomen placed PMH antiphospholipid syndrome on warfarin, CHF, A-fib, vtach vfib with ICD shock, CVA, CAD with MI, back surg 5 dis fusion 2010     Clinical Impressions Pt with continued break down at thumb area and ortho tech coming during OT session to help reduce the splint for decreased pressure. Pt could benefit from ortho consult in acute to visualize the pressure wound acquired from splint and possibly increase WBAT to allow progression to RW. Pt reports next appointment was 2 weeks out due to her choosing to push it out and prolong wearing her splint by choice to prevent injury if she fall down. Pt progressed this session using an eva walker with stable HR and O2 RA 88-93%     Media Information             If plan is discharge home, recommend the following:   Two people to help with walking and/or transfers;Two people to help with bathing/dressing/bathroom     Functional Status Assessment         Equipment Recommendations   Other (comment) (baratric BSC, platform walker as still NWB at hand with no updated orders from ortho)     Recommendations for Other Services         Precautions/Restrictions   Precautions Precautions: Fall Recall of Precautions/Restrictions: Intact Precaution/Restrictions Comments: JP drain L abdomen Required Braces or Orthoses: Splint/Cast Splint/Cast: L forearm - admitted with cast noted to have wound at thumb. See media image 07/03/24 worsening image added 07/07/24 (Ortho into trim back cast for improved mobility of thumb and reduce pressure on wound created by  cast) Restrictions Weight Bearing Restrictions Per Provider Order: Yes LUE Weight Bearing Per Provider Order: Weight bear through elbow only Other Position/Activity Restrictions: use platform walker for LUE     Mobility Bed Mobility Overal bed mobility: Needs Assistance Bed Mobility: Sit to Supine, Supine to Sit     Supine to sit: Min assist Sit to supine: Contact guard assist, HOB elevated, Used rails   General bed mobility comments: Pt able to pull to sit w/ min A for sup>sit. VC given for sequencing; increased time to complete.    Transfers Overall transfer level: Needs assistance Equipment used: Ambulation equipment used, Bilateral platform walker (EVA walker) Transfers: Sit to/from Stand Sit to Stand: Min assist, +2 safety/equipment, From elevated surface           General transfer comment: Cues for hand placement to and from seated surface      Balance Overall balance assessment: Needs assistance Sitting-balance support: No upper extremity supported, Feet supported Sitting balance-Leahy Scale: Fair Sitting balance - Comments: seated EOB   Standing balance support: Bilateral upper extremity supported, During functional activity, Reliant on assistive device for balance Standing balance-Leahy Scale: Poor Standing balance comment: reliant on external support                           ADL either performed or assessed with clinical judgement   ADL Overall ADL's : Needs assistance/impaired Eating/Feeding: Modified independent   Grooming: Modified independent;Sitting  Lower Body Dressing: Moderate assistance   Toilet Transfer: Minimal assistance (eva walker, bartric commode due to hip width. pt reports getting on nomral [facility] with RN staff and getting stuck for a little bit due to hips)           Functional mobility during ADLs: +2 for physical assistance;Minimal assistance (eva walker)       Vision         Perception          Praxis         Pertinent Vitals/Pain Pain Assessment Pain Assessment: Faces Faces Pain Scale: Hurts little more Pain Location: lower back and hand at thumb Pain Descriptors / Indicators: Discomfort Pain Intervention(s): Monitored during session, Premedicated before session, Repositioned     Extremity/Trunk Assessment Upper Extremity Assessment Upper Extremity Assessment: LUE deficits/detail LUE Deficits / Details: dressing taken off thumb to visualize wound area due to continued report of pressure. media image added to help demonstrate increased webspace breakdown. Called orthotech to room to help take down splint edge for decreased pressure and further progression of wound. Recommenation for ORTHO to visit patient and visualize hand. pt reports the cast is still on due to her extending her appointment and not wanting it off at the last appointment. Pt state it can come off can someone just come in here and take it off already?   Lower Extremity Assessment Lower Extremity Assessment: Defer to PT evaluation       Communication Communication Communication: No apparent difficulties   Cognition Arousal: Alert Behavior During Therapy: Anxious               OT - Cognition Comments: cues for hand placement safety during session                 Following commands: Intact       Cueing  General Comments      pt with increased break down on thumb currenlty so ortho tech called to room to help cut back the thumb space of splint area. Pt immediately reports relief at the thumb. the modification was only to open the webspace at the thumb and allow a dressing to completely cover the wound area   Exercises     Shoulder Instructions      Home Living                                          Prior Functioning/Environment                      OT Problem List:     OT Treatment/Interventions:        OT Goals(Current goals can be found  in the care plan section)   Acute Rehab OT Goals Patient Stated Goal: to get better and go home OT Goal Formulation: With patient Time For Goal Achievement: 07/19/24 Potential to Achieve Goals: Good ADL Goals Pt Will Perform Grooming: with modified independence;standing Pt Will Perform Lower Body Bathing: with modified independence;sitting/lateral leans Pt Will Perform Lower Body Dressing: with min assist;with adaptive equipment;sit to/from stand Pt Will Transfer to Toilet: with min assist;bedside commode;stand pivot transfer Pt Will Perform Toileting - Clothing Manipulation and hygiene: with modified independence Pt/caregiver will Perform Home Exercise Program: Increased strength;Left upper extremity;With minimal assist;With written HEP provided Additional ADL Goal #1: Pt will demonstrate energy conservation strageties x2 for sink  level adls Additional ADL Goal #2: pt will complete basic transfer with RW min (A) as precursor to adls.   OT Frequency:  Min 2X/week    Co-evaluation PT/OT/SLP Co-Evaluation/Treatment: Yes Reason for Co-Treatment: To address functional/ADL transfers PT goals addressed during session: Mobility/safety with mobility;Balance;Proper use of DME OT goals addressed during session: ADL's and self-care      AM-PAC OT 6 Clicks Daily Activity     Outcome Measure Help from another person eating meals?: None Help from another person taking care of personal grooming?: A Little Help from another person toileting, which includes using toliet, bedpan, or urinal?: A Lot Help from another person bathing (including washing, rinsing, drying)?: A Lot Help from another person to put on and taking off regular upper body clothing?: A Little Help from another person to put on and taking off regular lower body clothing?: A Lot 6 Click Score: 16   End of Session Equipment Utilized During Treatment: Rolling walker (2 wheels) Nurse Communication: Mobility  status;Precautions  Activity Tolerance: Patient tolerated treatment well Patient left: in bed;with call bell/phone within reach;with family/visitor present  OT Visit Diagnosis: Unsteadiness on feet (R26.81);Other abnormalities of gait and mobility (R26.89);Muscle weakness (generalized) (M62.81);Dizziness and giddiness (R42);Repeated falls (R29.6)                Time: 8897-8796 OT Time Calculation (min): 61 min Charges:  OT General Charges $OT Visit: 1 Visit OT Treatments $Self Care/Home Management : 23-37 mins   Brynn, OTR/L  Acute Rehabilitation Services Office: [phone] .   Ely Molt 07/07/2024, 1:38 PM

## 2024-07-07 NOTE — TOC Progression Note (Addendum)
 Transition of Care [facility]) - Progression Note    Patient Details  Name: [name] [name] MRN: [id number] Date of Birth: 02-Jul-1961  Transition of Care [facility]) CM/SW Contact  Lauraine FORBES Saa, LCSW Phone Number: 07/07/2024, 12:46 PM  Clinical Narrative:     12:46 PM This CSW assisted CSW Janee in providing patient's current SNF bed options ([address], [place], [place]) with their quarterly Medicare ratings to patient's nephew, Aleene, as patient deferred SNF decision to [place]. CSW emailed Philip ([email or web address]) SNF options. Philip accepted bed offer at [facility] on behalf of patient. CSW informed [facility] SNF admissions of bed acceptance. CSW initiated SNF and ambulance transportation insurance authorization which is currently pending.  Expected Discharge Plan: Skilled Nursing Facility Barriers to Discharge: Continued Medical Work up, SNF Pending bed offer, English as a second language teacher  Expected Discharge Plan and Services In-house Referral: Clinical Social Work     Living arrangements for the past 2 months: Single Family Home                           HH Arranged: PT HH Agency: [facility] Date [facility] Agency Contacted: 06/28/24 Time HH Agency Contacted: [phone] Representative spoke with at [facility] Agency: [place]   Social Determinants of Health (SDOH) Interventions SDOH Screenings   Food Insecurity: No Food Insecurity (06/26/2024)  Housing: Low Risk  (06/26/2024)  Transportation Needs: No Transportation Needs (06/26/2024)  Utilities: Not At Risk (06/26/2024)  Alcohol Screen: Low Risk  (09/18/2023)  Depression (PHQ2-9): Low Risk  (03/11/2024)  Financial Resource Strain: Low Risk  (03/10/2024)  Physical Activity: Inactive (03/10/2024)  Social Connections: Socially Isolated (06/26/2024)  Stress: Stress Concern Present (03/10/2024)  Tobacco Use: High Risk (07/01/2024)  Health Literacy: Adequate Health Literacy (09/18/2023)    Readmission Risk  Interventions    06/26/2024    5:05 PM  Readmission Risk Prevention Plan  Post Dischage Appt Complete  Medication Screening Complete  Transportation Screening Complete

## 2024-07-07 NOTE — Progress Notes (Signed)
 Initial Nutrition Assessment  DOCUMENTATION CODES:   Non-severe (moderate) malnutrition in context of chronic illness, Obesity unspecified  INTERVENTION:  -Liberalized diet to regular d/t inadequate PO intake, dx malnutrition -Add MVI w/min -Change ONS to ProSource Plus as pt does not tolerate milky ONS -Education provided on weight loss, goal of gradual loss to help preserve muscle mass.  -Education on importance of strength/resistance training for weight loss, muscle preservation, bone strength.    NUTRITION DIAGNOSIS:   Moderate Malnutrition related to nausea, decreased appetite, early satiety as evidenced by mild fat depletion, mild muscle depletion, percent weight loss, energy intake < 75% for > or equal to 3 months.   GOAL:   Patient will meet greater than or equal to 90% of their needs  MONITOR:   PO intake, Supplement acceptance, Labs, Weight trends  REASON FOR ASSESSMENT:   LOS (Poor PO intake)    ASSESSMENT:   63 y.o.F admitted s/p fall with abdominal wall injury. Pt with n/v limiting intake on Wegovy . Last Wegovy  2 weeks ago. Continues with varied PO intake.  Spoke to pt at bedside. Pt states n/v much improved, appetite slowly improving. Pt did have [employer] chicken burrito at bedside, says that sounded good. Discussed importance of adequate kcal/pro intake for healing, heart healthy diet recommendations. No c/d or chewing/swallowing difficulties. Last BM today. Documented PO intake average 36% x admit; suspect she is not meeting her estimated nutritional needs at this time. Per documentation pt with 28 lbs weight loss (13%) x 6 months, which is significant. Pt does not like milk based ONS, changed to Prosource BID. Liberalized diet to regular d/t inadequate PO intake, dx malnutrition. Education provided on weight loss nutrition, goal of gradual loss to help preserve muscle mass. Discussed importance of strength/resistance training for weight loss, muscle  preservation, bone strength. Discussed focusing on protein intake during meals, as well as small frequent meal pattern. Pt denies additional questions/concerns at this time, will continue to monitor, RDN available prn.   Weight Information (since admission)     Date/Time Weight Weight in lbs BSA (Calculated - sq m) BMI (Calculated) Who   07/07/24 0400 95.3 kg 210.1 lbs -- 33.93 HM   07/06/24 1248 87 kg 191.8 lbs 2.01 sq meters 30.97 RF   07/01/24 0839 87.1 kg 192 lbs 2.01 sq meters 31 MH   07/01/24 0500 94.5 kg 208.34 lbs -- 34.67 BR   06/30/24 0306 94.9 kg 209.15 lbs 2.09 sq meters 34.8 LM   06/27/24 0423 92.4 kg 203.71 lbs -- 33.9 DC   06/25/24 2152 88.5 kg 195 lbs 2.01 sq meters 32.45 NG       Labs Na 132 BUN 7 Calcium  7.9 Albumin  2.5 ALT 53 H/H 9.3/29.1  Medications Amiodarone  Carvedilol  Duloxetine  Mg sulfate IV Midodrine  Protonix  Warfarin  NUTRITION - FOCUSED PHYSICAL EXAM:  Flowsheet Row Most Recent Value  Orbital Region Mild depletion  Upper Arm Region Mild depletion  Thoracic and Lumbar Region No depletion  Buccal Region Mild depletion  Temple Region Moderate depletion  Clavicle Bone Region Mild depletion  Clavicle and Acromion Bone Region Mild depletion  Scapular Bone Region No depletion  Dorsal Hand Mild depletion  Patellar Region Mild depletion  [difficult to assess r/t edema]  Anterior Thigh Region Mild depletion  Posterior Calf Region Mild depletion  [difficult to assess r/t edema]  Edema (RD Assessment) Moderate  Hair Reviewed  [Thinning]  Eyes Reviewed  Mouth Reviewed  Skin Reviewed  Nails Reviewed    Diet Order:  Diet Order             Diet regular Room service appropriate? Yes; Fluid consistency: Thin  Diet effective now                   EDUCATION NEEDS:   Education needs have been addressed  Skin:  Skin Assessment: Skin Integrity Issues: Skin Integrity Issues:: Other (Comment) Other: Abrasion, ecchymosis  Last BM:  7/9  Type 5/6  Height:   Ht Readings from Last 1 Encounters:  07/06/24 5' 6 (1.676 m)    Weight:   Wt Readings from Last 1 Encounters:  07/07/24 95.3 kg    BMI:  Body mass index is 33.91 kg/m.  Estimated Nutritional Needs:   Kcal:  1850-2150 kcal  Protein:  90-120 g  Fluid:  >/=2 L    Mung Rinker Daml-Budig, RDN, LDN Registered Dietitian Nutritionist RD Inpatient Contact Info in [place]

## 2024-07-08 ENCOUNTER — Inpatient Hospital Stay (HOSPITAL_COMMUNITY)

## 2024-07-08 ENCOUNTER — Encounter (HOSPITAL_COMMUNITY): Payer: Self-pay | Admitting: Internal Medicine

## 2024-07-08 DIAGNOSIS — S301XXA Contusion of abdominal wall, initial encounter: Secondary | ICD-10-CM | POA: Diagnosis not present

## 2024-07-08 LAB — CBC
HCT: 34.4 % — ABNORMAL LOW (ref 36.0–46.0)
Hemoglobin: 10.8 g/dL — ABNORMAL LOW (ref 12.0–15.0)
MCH: 30.7 pg (ref 26.0–34.0)
MCHC: 31.4 g/dL (ref 30.0–36.0)
MCV: 97.7 fL (ref 80.0–100.0)
Platelets: 239 K/uL (ref 150–400)
RBC: 3.52 MIL/uL — ABNORMAL LOW (ref 3.87–5.11)
RDW: 15 % (ref 11.5–15.5)
WBC: 10.3 K/uL (ref 4.0–10.5)
nRBC: 0.3 % — ABNORMAL HIGH (ref 0.0–0.2)

## 2024-07-08 LAB — TROPONIN I (HIGH SENSITIVITY)
Troponin I (High Sensitivity): 59 ng/L — ABNORMAL HIGH (ref <18)
Troponin I (High Sensitivity): 60 ng/L — ABNORMAL HIGH (ref <18)
Troponin I (High Sensitivity): 59 ng/L — ABNORMAL HIGH (ref <18)

## 2024-07-08 LAB — PROTIME-INR
INR: 2.4 — ABNORMAL HIGH (ref 0.8–1.2)
Prothrombin Time: 27.2 s — ABNORMAL HIGH (ref 11.4–15.2)

## 2024-07-08 LAB — D-DIMER, QUANTITATIVE: D-Dimer, Quant: 2.1 ug{FEU}/mL — ABNORMAL HIGH (ref 0.00–0.50)

## 2024-07-08 MED ORDER — WARFARIN SODIUM 2.5 MG PO TABS
2.5000 mg | ORAL_TABLET | Freq: Once | ORAL | Status: AC
  Administered 2024-07-08: 2.5 mg via ORAL
  Filled 2024-07-08: qty 1

## 2024-07-08 MED ORDER — LORAZEPAM 0.5 MG PO TABS
0.5000 mg | ORAL_TABLET | Freq: Four times a day (QID) | ORAL | Status: DC | PRN
  Administered 2024-07-08 – 2024-07-09 (×2): 0.5 mg via ORAL
  Filled 2024-07-08 (×2): qty 1

## 2024-07-08 MED ORDER — MORPHINE SULFATE (PF) 2 MG/ML IV SOLN
1.0000 mg | INTRAVENOUS | Status: DC | PRN
  Administered 2024-07-08 – 2024-07-09 (×4): 1 mg via INTRAVENOUS
  Filled 2024-07-08 (×4): qty 1

## 2024-07-08 MED ORDER — FUROSEMIDE 10 MG/ML IJ SOLN
40.0000 mg | Freq: Four times a day (QID) | INTRAMUSCULAR | Status: AC
  Administered 2024-07-08 (×2): 40 mg via INTRAVENOUS
  Filled 2024-07-08 (×2): qty 4

## 2024-07-08 MED ORDER — FUROSEMIDE 10 MG/ML IJ SOLN: 40.0000 mg | Freq: Once | INTRAMUSCULAR | Status: DC

## 2024-07-08 NOTE — Plan of Care (Signed)
  Problem: Health Behavior/Discharge Planning: Goal: Ability to manage health-related needs will improve Outcome: Progressing   Problem: Education: Goal: Knowledge of General Education information will improve Description: Including pain rating scale, medication(s)/side effects and non-pharmacologic comfort measures Outcome: Progressing   Problem: Clinical Measurements: Goal: Will remain free from infection Outcome: Progressing   Problem: Clinical Measurements: Goal: Respiratory complications will improve Outcome: Progressing   

## 2024-07-08 NOTE — Progress Notes (Signed)
 [facility] Progress Note Patient: [name] [name] FMW:[id number] DOB: [birth year] DOA: 06/25/2024  DOS: the patient was seen and examined on 07/08/2024  Brief Hospital Course: 63 y.o. 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  Workup noted abdominal wall hematoma,. 7/3 : underwent Incision and drainage of hematoma, JP drain placement-Dr.Bridges hospital course notable for persistent hypotension, multifactorial> admission to ICU.   7/5 patient remained hypotensive and pressor dependent prompting decision to move to [facility] for further ongoing workup and management  7/5 remains pressor dependent, warfarin restarted 7/7 weaned off pressors, Foley catheter removed 7/8 transferred to [facility] service, urinary retention required I/O cath   Assessment and Plan: Abdominal wall hematoma status post fall Acute blood loss anemia Underwent exploration with surgery to rule out abscess 7/3, cultures negative  She does have large bruise on left side of abdomen, left hip region and left thigh JP drain in place, minimal dark bloody output -per Dr.Bridges notes from [facility], can remove JP if output<50/day, would ideally like to wait for drain removal once her JP output is less than 50 mL/day for 2 days in a row given significant fluctuation noted. Transfuse 1 unit of PRBC this admission -Hemoglobin appears stable, anticoagulation was restarted   Anti phospholipid antibody syndrome -With history of CVA, on warfarin at baseline, supratherapeutic INR on admission and hematoma, acute blood loss anemia -Warfarin resumed 7/5   Persistent hypotension -Multifactorial from acute blood loss anemia, CHF, cardiac meds. Requiring high dose of midodrine .  Was able to taper off to 10 mg 3 times daily on 7/10.   CAD with prior MI Acute on chronic  combined HFrEF and HFpEF status post AICD Echocardiogram 06/27/2024 with EF of 35 to 40% and grade 1 diastolic dysfunction Followed by advanced heart failure team Home regimen includes Coreg , Imdur , Entresto , Jardiance .  And torsemide  Coreg  has been resumed for now and patient is tolerating it well. Will provide IV Lasix  given volume overload.  Paroxysmal A-fib Continue amiodarone , warfarin -Currently in sinus rhythm   Recurrent falls  -Wonder if hypotension is responsible for this.  -PT OT consult    Hyponatremia, hypervolemic NA improved   Left distal radius fracture Patient has hard cast PCCM service as well as myself both have tried to reach out to Dr. Alyse service. Ortho tech cut a piece of the cast.  I have not heard back from Dr. Alyse office yet.   Urinary retention Monitor.  In-N-Out cath for now.  Chest tightness. Patient reports some chest tightness on 7/10. Chest x-ray shows evidence of volume overload. D-dimer is mildly elevated but she remains therapeutic with INR therefore less likely PE. EKG unremarkable.  Troponins are elevated again but not in ACS territory. For now pain control with Percocet.  Obesity Body mass index is 35.23 kg/m.  Placing the patient at high risk of poor outcome.  Moderate protein-calorie malnutrition  Continue supplementation and Interventions: Refer to RD note for recommendations   Subjective: Reported shortness of breath.  Reports some chest tightness as well as back pain at the time of my evaluation.  No nausea no vomiting no fever no chills.  Worried that her left arm is swollen as well.  Physical Exam: General: in moderate distress, No Rash Cardiovascular: S1 and S2 Present, No Murmur Respiratory: Increased respiratory effort, Bilateral Air entry present.  Basal crackles, No wheezes Abdomen:  Bowel Sound present, No tenderness Extremities: Worsening edema Neuro: Alert and oriented x3, no new focal deficit, appears  anxious  Data Reviewed: I have Reviewed nursing notes, Vitals, and Lab results. Since last encounter, pertinent lab results CBC and BMP   . I have ordered test including CBC BMP troponin. I have ordered imaging chest x-ray. I have independently visualized and interpreted imaging chest x-ray which showed vascular congestion. I have independently visualized and interpreted EKG which showed EKG: normal sinus rhythm, nonspecific ST and T waves changes.   Disposition: Status is: Inpatient Remains inpatient appropriate because: Monitor for improvement in volume status  SCDs Start: 06/26/24 1016 Place TED hose Start: 06/26/24 1016   Family Communication: Son at bedside Level of care: Telemetry Cardiac   Vitals:   07/08/24 0742 07/08/24 1138 07/08/24 1209 07/08/24 1629  BP: 116/75 116/79 113/60 123/74  Pulse: 94 94 98 92  Resp: (!) 25 (!) 21 13 19   Temp: 98 F (36.7 C) 98 F (36.7 C) 98 F (36.7 C) 98 F (36.7 C)  TempSrc: Oral Oral Oral Oral  SpO2: 93% 95% 94% 100%  Weight:      Height:         Author: Yetta Blanch, MD 07/08/2024 7:16 PM  Please look on [email or web address] to find out who is on call.

## 2024-07-08 NOTE — Progress Notes (Addendum)
 PHARMACY - ANTICOAGULATION CONSULT NOTE  Pharmacy Consult for warfarin Indication: VTE  Allergies  Allergen Reactions   Beef-Derived Drug Products Anaphylaxis and Nausea Only    From tick bite   Metoclopramide Hcl Anaphylaxis    Non responsive   Penicillins Anaphylaxis    Shock Immediate rash, facial/tongue/throat swelling, SOB or lightheadedness with hypotension   Pork-Derived Products Anaphylaxis and Nausea Only    From tick bite. Has tolerated heparin /lovenox    Metoclopramide Other (See Comments)    Unknown    Metoprolol Other (See Comments)    Syncope    Statins Other (See Comments)    Myalgias    Patient Measurements: Height: 5' 6 (167.6 cm) Weight: 99 kg (218 lb 4.1 oz) IBW/kg (Calculated) : 59.3 HEPARIN  DW (KG): 78  Vital Signs: Temp: 98 F (36.7 C) (07/10 0742) Temp Source: Oral (07/10 0742) BP: 116/79 (07/10 0438) Pulse Rate: 94 (07/10 0438)  Labs: Recent Labs    07/06/24 0518 07/07/24 0256 07/08/24 0245  HGB 8.4* 9.3* 10.8*  HCT 26.2* 29.1* 34.4*  PLT 219 234 239  LABPROT 22.8* 24.9* 27.2*  INR 1.9* 2.1* 2.4*  CREATININE 0.67 0.69  --     Estimated Creatinine Clearance: 87.7 mL/min (by C-G formula based on SCr of 0.69 mg/dL).   Medical History: Past Medical History:  Diagnosis Date   AICD (automatic cardioverter/defibrillator) present    Aneurysm of internal carotid artery    Anti-phospholipid syndrome (HCC)    Anticardiolipin antibody positive    Chronic Coumadin    Brain aneurysm    followed by Dr. Dolphus   Chronic combined systolic (congestive) and diastolic (congestive) heart failure (HCC)    Cocaine abuse in remission [facility])    Coronary atherosclerosis of native coronary artery    Previous invasive cardiac testing was done at a facility in [place], [place].  Occluded diagonal, Diffuse LAD disease, Occluded Cx, and non obstructive RCA.    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 Pharmacy to resume warfarin.   Home regimen is 5mg  MWF, 2.5mg  TTSS > of note INR was 6.3 on admission requiring vit K.  INR today is therapeutic at 2.4 after warfarin restarted on 7/5. Hgb trending up, Plts stable  Goal of Therapy:  INR 2-3 Monitor platelets by anticoagulation protocol: Yes   Plan:  Warfarin 2.5 mg PO x1 tonight Daily INR  [name] [name], PharmD PGY-1 Pharmacy Resident [facility]  07/08/2024 10:15 AM

## 2024-07-09 ENCOUNTER — Inpatient Hospital Stay (HOSPITAL_COMMUNITY)

## 2024-07-09 DIAGNOSIS — S301XXA Contusion of abdominal wall, initial encounter: Secondary | ICD-10-CM | POA: Diagnosis not present

## 2024-07-09 LAB — PROCALCITONIN: Procalcitonin: 0.21 ng/mL

## 2024-07-09 LAB — CBC
HCT: 32.7 % — ABNORMAL LOW (ref 36.0–46.0)
Hemoglobin: 10.7 g/dL — ABNORMAL LOW (ref 12.0–15.0)
MCH: 31.2 pg (ref 26.0–34.0)
MCHC: 32.7 g/dL (ref 30.0–36.0)
MCV: 95.3 fL (ref 80.0–100.0)
Platelets: 222 K/uL (ref 150–400)
RBC: 3.43 MIL/uL — ABNORMAL LOW (ref 3.87–5.11)
RDW: 15 % (ref 11.5–15.5)
WBC: 18.7 K/uL — ABNORMAL HIGH (ref 4.0–10.5)
nRBC: 0.2 % (ref 0.0–0.2)

## 2024-07-09 LAB — PROTIME-INR
INR: 3.4 — ABNORMAL HIGH (ref 0.8–1.2)
Prothrombin Time: 36.1 s — ABNORMAL HIGH (ref 11.4–15.2)

## 2024-07-09 LAB — BASIC METABOLIC PANEL WITH GFR
Anion gap: 20 — ABNORMAL HIGH (ref 5–15)
BUN: 19 mg/dL (ref 8–23)
CO2: 15 mmol/L — ABNORMAL LOW (ref 22–32)
Calcium: 8.2 mg/dL — ABNORMAL LOW (ref 8.9–10.3)
Chloride: 98 mmol/L (ref 98–111)
Creatinine, Ser: 1.26 mg/dL — ABNORMAL HIGH (ref 0.44–1.00)
GFR, Estimated: 49 mL/min — ABNORMAL LOW (ref >60)
Glucose, Bld: 71 mg/dL (ref 70–99)
Potassium: 5.2 mmol/L — ABNORMAL HIGH (ref 3.5–5.1)
Sodium: 133 mmol/L — ABNORMAL LOW (ref 135–145)

## 2024-07-09 LAB — MAGNESIUM: Magnesium: 1.9 mg/dL (ref 1.7–2.4)

## 2024-07-09 MED ORDER — FUROSEMIDE 10 MG/ML IJ SOLN
40.0000 mg | Freq: Two times a day (BID) | INTRAMUSCULAR | Status: DC
  Administered 2024-07-09: 40 mg via INTRAVENOUS
  Filled 2024-07-09 (×2): qty 4

## 2024-07-09 MED ORDER — SENNOSIDES-DOCUSATE SODIUM 8.6-50 MG PO TABS
2.0000 | ORAL_TABLET | Freq: Two times a day (BID) | ORAL | Status: DC
  Administered 2024-07-09 – 2024-07-11 (×4): 2 via ORAL
  Filled 2024-07-09 (×4): qty 2

## 2024-07-09 MED ORDER — FUROSEMIDE 10 MG/ML IJ SOLN: 40.0000 mg | Freq: Every day | INTRAMUSCULAR | Status: DC

## 2024-07-09 MED ORDER — BOOST PLUS PO LIQD
237.0000 mL | Freq: Three times a day (TID) | ORAL | Status: DC
  Administered 2024-07-10 (×3): 237 mL via ORAL
  Filled 2024-07-09 (×9): qty 237

## 2024-07-09 MED ORDER — OXYCODONE-ACETAMINOPHEN 5-325 MG PO TABS
1.0000 | ORAL_TABLET | Freq: Four times a day (QID) | ORAL | Status: DC | PRN
  Filled 2024-07-09 (×2): qty 1

## 2024-07-09 MED ORDER — SIMETHICONE 80 MG PO CHEW
80.0000 mg | CHEWABLE_TABLET | Freq: Four times a day (QID) | ORAL | Status: DC
  Administered 2024-07-09 – 2024-07-11 (×7): 80 mg via ORAL
  Filled 2024-07-09 (×8): qty 1

## 2024-07-09 MED ORDER — OXYCODONE HCL 5 MG PO TABS
5.0000 mg | ORAL_TABLET | Freq: Four times a day (QID) | ORAL | Status: DC | PRN
  Filled 2024-07-09 (×3): qty 1

## 2024-07-09 NOTE — Progress Notes (Signed)
 PHARMACY - ANTICOAGULATION CONSULT NOTE  Pharmacy Consult for warfarin Indication: VTE  Allergies  Allergen Reactions   Beef-Derived Drug Products Anaphylaxis and Nausea Only    From tick bite   Metoclopramide Hcl Anaphylaxis    Non responsive   Penicillins Anaphylaxis    Shock Immediate rash, facial/tongue/throat swelling, SOB or lightheadedness with hypotension   Pork-Derived Products Anaphylaxis and Nausea Only    From tick bite. Has tolerated heparin /lovenox    Metoclopramide Other (See Comments)    Unknown    Metoprolol Other (See Comments)    Syncope    Statins Other (See Comments)    Myalgias    Patient Measurements: Height: 5' 6 (167.6 cm) Weight: 99 kg (218 lb 4.1 oz) IBW/kg (Calculated) : 59.3 HEPARIN  DW (KG): 78  Vital Signs: Temp: 97.8 F (36.6 C) (07/11 0447) Temp Source: Oral (07/11 0447) BP: 110/72 (07/11 0447) Pulse Rate: 88 (07/11 0447)  Labs: Recent Labs    07/07/24 0256 07/08/24 0245 07/08/24 1525 07/08/24 1647 07/08/24 1845 07/09/24 0315  HGB 9.3* 10.8*  --   --   --  10.7*  HCT 29.1* 34.4*  --   --   --  32.7*  PLT 234 239  --   --   --  222  LABPROT 24.9* 27.2*  --   --   --  36.1*  INR 2.1* 2.4*  --   --   --  3.4*  CREATININE 0.69  --   --   --   --   --   TROPONINIHS  --   --  59* 60* 59*  --     Estimated Creatinine Clearance: 87.7 mL/min (by C-G formula based on SCr of 0.69 mg/dL).   Medical History: Past Medical History:  Diagnosis Date   AICD (automatic cardioverter/defibrillator) present    Aneurysm of internal carotid artery    Anti-phospholipid syndrome (HCC)    Anticardiolipin antibody positive    Chronic Coumadin    Brain aneurysm    followed by Dr. Dolphus   Chronic combined systolic (congestive) and diastolic (congestive) heart failure (HCC)    Cocaine abuse in remission [facility])    Coronary atherosclerosis of native coronary artery    Previous invasive cardiac testing was done at a facility in [place],  [place].  Occluded diagonal, Diffuse LAD disease, Occluded Cx, and non obstructive RCA.    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 Pharmacy to resume warfarin.   Home regimen is 5mg  MWF, 2.5mg  TTSS > of note INR was 6.3 on admission requiring vit K.  INR today is supra-therapeutic at 3.4.  Hgb trending up, Plts stable.  Goal of Therapy:  INR 2-3 Monitor platelets by anticoagulation protocol: Yes   Plan:  Hold warfarin today Daily INR  Jonahtan Manseau, PharmD PGY-1 Pharmacy Resident [facility]  07/09/2024 7:22 AM

## 2024-07-09 NOTE — Progress Notes (Signed)
 [facility] Progress Note Patient: [name] [name] FMW:[id number] DOB: Feb 16, 1961 DOA: 06/25/2024  DOS: the patient was seen and examined on 07/09/2024  Brief Hospital Course: 63 y.o. 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  Workup noted abdominal wall hematoma,. 7/3 : underwent Incision and drainage of hematoma, JP drain placement-Dr.Bridges hospital course notable for persistent hypotension, multifactorial> admission to ICU.   7/5 patient remained hypotensive and pressor dependent prompting decision to move to [facility] for further ongoing workup and management  7/5 remains pressor dependent, warfarin restarted 7/7 weaned off pressors, Foley catheter removed 7/8 transferred to [facility] service, urinary retention required I/O cath 7/10, started on IV diuresis.  Assessment and Plan: Abdominal wall hematoma status post fall Acute blood loss anemia Underwent exploration with surgery to rule out abscess 7/3, cultures negative  She does have large bruise on left side of abdomen, left hip region and left thigh JP drain in place, minimal dark bloody output -per Dr.Bridges notes from [facility], can remove JP if output<50/day, would ideally like to wait for drain removal once her JP output is less than 50 mL/day for 2 days in a row given fluctuation noted. Transfused 1 unit of PRBC this admission Hemoglobin appears stable, anticoagulation was restarted   Anti phospholipid antibody syndrome With history of CVA, on warfarin at baseline, supratherapeutic INR on admission and hematoma, acute blood loss anemia Warfarin resumed 7/5.  INR supratherapeutic on 7/11.   Persistent hypotension Multifactorial from acute blood loss anemia, CHF, cardiac meds. Requiring high dose of midodrine .  Was able to taper off to 10 mg 3 times daily on  7/10.   CAD with prior MI Acute on chronic combined HFrEF and HFpEF status post AICD Echocardiogram 06/27/2024 with EF of 35 to 40% and grade 1 diastolic dysfunction Followed by advanced heart failure team Home regimen includes Coreg , Imdur , Entresto , Jardiance .  And torsemide  Coreg  has been resumed for now and patient is tolerating it well. Continue with IV Lasix .  Monitor renal function.  Paroxysmal A-fib Continue amiodarone , warfarin Currently in sinus rhythm   Recurrent falls  Wonder if hypotension is responsible for this.  PT OT consult    Hyponatremia, hypervolemic NA improved   Left distal radius fracture Patient has hard cast PCCM service as well as myself both have tried to reach out to Dr. Alyse service. Ortho tech cut a piece of the cast.  I have not heard back from Dr. Alyse office yet.   Urinary retention Monitor.  In-N-Out cath for now.  Chest tightness. Patient reports some chest tightness on 7/10. Chest x-ray shows evidence of volume overload. D-dimer is mildly elevated but she remains therapeutic with INR therefore less likely PE. EKG unremarkable.  Troponins are elevated again but not in ACS territory. For now pain control with Percocet.  Obesity Body mass index is 35.23 kg/m.  Placing the patient at high risk of poor outcome.  Moderate protein-calorie malnutrition  Continue supplementation and Interventions: Refer to RD note for recommendations  Hyperkalemia. AKI. Renal function was 0.69 on 7/9.  Renal function is 1.26 on 7/11. Currently receiving IV diuresis.  Potassium level 5.2. Will recheck tomorrow.  Leukocytosis. Likely stress reaction. For now we will monitor. Procalcitonin is elevated therefore lower threshold to initiate antibiotic if indicated.  Intractable nausea and vomiting.  Likely in the setting of constipation. X-ray abdomen shows  some dilated bowel loops without any obstruction and also shows evidence of proximal bowel  stool. So far no vomiting. Add Gas-X.  Continue bowel regimen.   Subjective: Reported nausea and vomiting yesterday.  Currently resolved.  Breathing improved.  Chest pain was also reported, currently resolved.  Physical Exam: General: in Mild distress, No Rash Cardiovascular: S1 and S2 Present, No Murmur Respiratory: Improving respiratory effort, Bilateral Air entry present.  Improving crackles, No wheezes Abdomen: Bowel Sound present, mild diffuse tenderness Extremities: Improving edema Neuro: Alert and oriented x3, no new focal deficit  Data Reviewed: I have Reviewed nursing notes, Vitals, and Lab results. Since last encounter, pertinent lab results CBC and BMP   . I have ordered test including CBC and BMP  . I have discussed pt's care plan and test results with x-ray chest and x-ray abdomen  .   Disposition: Status is: Inpatient Remains inpatient appropriate because: Monitor for improvement in volume status  SCDs Start: 06/26/24 1016 Place TED hose Start: 06/26/24 1016   Family Communication: Family at bedside Level of care: Telemetry Cardiac   Vitals:   07/09/24 0447 07/09/24 0710 07/09/24 1315 07/09/24 1555  BP: 110/72 112/77 (!) 102/53 93/69  Pulse: 88 84 88 86  Resp: 14 17 (!) 22 14  Temp: 97.8 F (36.6 C) 97.8 F (36.6 C) 97.7 F (36.5 C) 98.4 F (36.9 C)  TempSrc: Oral Oral Oral Oral  SpO2: 96% 97% 99% 100%  Weight:      Height:         Author: Yetta Blanch, MD 07/09/2024 6:38 PM  Please look on [email or web address] to find out who is on call.

## 2024-07-09 NOTE — TOC Progression Note (Addendum)
 Transition of Care [facility]) - Progression Note    Patient Details  Name: [name] [name] MRN: [id number] Date of Birth: [birth year]  Transition of Care [facility]) CM/SW Contact  Luise JAYSON Pan, [place] Phone Number: 07/09/2024, 9:14 AM  Clinical Narrative:   Insurance called CSW and stated Approval for PTAR - [id number]; Denial for SNF. Insurance offering a peer to peer for SNF due today by 3PM; MD will need to call Dr. Janit at [phone].  Above information has been communicated to MD.   3:14 PM Insurance called stating peer to peer was not completed. CSW informed insurance that per MD, patient is not medically ready. Insurance is proceeding with a denial but stated CSW can call to restart auth at a later time due to patient not being medically stable. Facility has been notified.   TOC will continue to follow.    Expected Discharge Plan: Skilled Nursing Facility Barriers to Discharge: Continued Medical Work up, SNF Pending bed offer, English as a second language teacher  Expected Discharge Plan and Services In-house Referral: Clinical Social Work     Living arrangements for the past 2 months: Single Family Home                           HH Arranged: PT HH Agency: [facility] Date [facility] Agency Contacted: 06/28/24 Time HH Agency Contacted: 1546 Representative spoke with at [facility] Agency: Dorothe   Social Determinants of Health (SDOH) Interventions SDOH Screenings   Food Insecurity: No Food Insecurity (06/26/2024)  Housing: Low Risk  (06/26/2024)  Transportation Needs: No Transportation Needs (06/26/2024)  Utilities: Not At Risk (06/26/2024)  Alcohol Screen: Low Risk  (09/18/2023)  Depression (PHQ2-9): Low Risk  (03/11/2024)  Financial Resource Strain: Low Risk  (03/10/2024)  Physical Activity: Inactive (03/10/2024)  Social Connections: Socially Isolated (06/26/2024)  Stress: Stress Concern Present (03/10/2024)  Tobacco Use: High Risk (07/01/2024)  Health Literacy: Adequate Health Literacy  (09/18/2023)    Readmission Risk Interventions    06/26/2024    5:05 PM  Readmission Risk Prevention Plan  Post Dischage Appt Complete  Medication Screening Complete  Transportation Screening Complete

## 2024-07-09 NOTE — Progress Notes (Signed)
 Physical Therapy Treatment Patient Details Name: [name] [name] MRN: [id number] DOB: May 11, 1961 Today's Date: 07/09/2024   History of Present Illness 63 y.o. female admitted 6/27 to [facility] s/p fall with abdominal wall injury pt with nausea and vomiting limiting intake with recent start to [place] . 7/2 moved to [facility] HTN and pressor dependent  7/3 JP drain L abdomen placed PMH antiphospholipid syndrome on warfarin, CHF, A-fib, vtach vfib with ICD shock, CVA, CAD with MI, back surg 5 dis fusion 2010    PT Comments  The pt is making good functional progress as she was able to ambulate an increased distance of up to ~35 ft with CGA-minA today. However, she ambulates very slowly, which is indicative of being at risk for further falls and injuries. She is also anxious with mobility, needing encouragement to progress herself and that she is supported by therapist throughout session to ensure her safety. She needed cues to breathe in through her nose and out her mouth in sync with therapist at one point to reduce her rapid breathing and anxiety. She continues to require minA for bed mobility and transfers at this time, suggesting deficits in strength and power. Will continue to follow acutely.       If plan is discharge home, recommend the following: Help with stairs or ramp for entrance;Assistance with cooking/housework;A little help with walking and/or transfers;A lot of help with bathing/dressing/bathroom;Assist for transportation   Can travel by private vehicle     Yes  Equipment Recommendations  BSC/3in1;Other (comment) (L platform RW)    Recommendations for Other Services       Precautions / Restrictions Precautions Precautions: Fall Recall of Precautions/Restrictions: Intact Precaution/Restrictions Comments: JP drain L abdomen Required Braces or Orthoses: Splint/Cast Splint/Cast: L forearm - admitted with cast noted to have wound at thumb. See media image 07/03/24 Restrictions Weight Bearing  Restrictions Per Provider Order: Yes LUE Weight Bearing Per Provider Order: Weight bear through elbow only Other Position/Activity Restrictions: use platform walker for LUE     Mobility  Bed Mobility Overal bed mobility: Needs Assistance Bed Mobility: Supine to Sit     Supine to sit: Min assist, HOB elevated, Used rails     General bed mobility comments: Pt needs cues to not push/pull through L hand. MinA provided to sit up R EOB    Transfers Overall transfer level: Needs assistance Equipment used: Bilateral platform walker (upright rollator) Transfers: Sit to/from Stand Sit to Stand: Min assist, Via lift equipment           General transfer comment: EOB elevated per pt request. Cues provided for hand placement. MinA needed to power up to stand to bil platform upright rollator    Ambulation/Gait Ambulation/Gait assistance: Contact guard assist, Min assist Gait Distance (Feet): 35 Feet Assistive device: Bilateral platform walker (upright rollator) Gait Pattern/deviations: Step-through pattern, Decreased stride length Gait velocity: reduced Gait velocity interpretation: <1.31 ft/sec, indicative of household ambulator   General Gait Details: Pt took slow, small steps while supporting herself on the bil platform upright rollator, CGA-minA for safety and balance, no LOB. Cues provided throughout for controlling breathing with success noted   Stairs             Wheelchair Mobility     Tilt Bed    Modified Rankin (Stroke Patients Only)       Balance Overall balance assessment: Needs assistance Sitting-balance support: No upper extremity supported, Feet supported Sitting balance-Leahy Scale: Fair Sitting balance - Comments: no LOB sitting statically  EOB with supervision for safety   Standing balance support: Bilateral upper extremity supported, During functional activity, Reliant on assistive device for balance Standing balance-Leahy Scale: Poor Standing  balance comment: reliant on UE support                            Communication Communication Communication: No apparent difficulties  Cognition Arousal: Alert Behavior During Therapy: Anxious   PT - Cognitive impairments: No apparent impairments                       PT - Cognition Comments: Pt began to breathe rapidly with transitioning supine to sit and was unable to slow breathing when cued except when therapist got at eye level and had pt breathe in and out in sync with therapist. No rapid breathing noted for rest of session. Needs encouragement to control anxiety Following commands: Intact      Cueing Cueing Techniques: Verbal cues, Visual cues  Exercises      General Comments General comments (skin integrity, edema, etc.): VSS on 2L      Pertinent Vitals/Pain Pain Assessment Pain Assessment: Faces Faces Pain Scale: Hurts little more Pain Location: lower back, abdomen, and hand at thumb Pain Descriptors / Indicators: Discomfort, Grimacing, Guarding Pain Intervention(s): Monitored during session, Limited activity within patient's tolerance, Repositioned    Home Living                          Prior Function            PT Goals (current goals can now be found in the care plan section) Acute Rehab PT Goals Patient Stated Goal: To walk with confidence PT Goal Formulation: With patient Time For Goal Achievement: 07/19/24 Potential to Achieve Goals: Good Progress towards PT goals: Progressing toward goals    Frequency    Min 2X/week      PT Plan      Co-evaluation              AM-PAC PT 6 Clicks Mobility   Outcome Measure  Help needed turning from your back to your side while in a flat bed without using bedrails?: A Little Help needed moving from lying on your back to sitting on the side of a flat bed without using bedrails?: A Little Help needed moving to and from a bed to a chair (including a wheelchair)?: A  Little Help needed standing up from a chair using your arms (e.g., wheelchair or bedside chair)?: A Little Help needed to walk in hospital room?: A Little Help needed climbing 3-5 steps with a railing? : Total 6 Click Score: 16    End of Session Equipment Utilized During Treatment: Gait belt;Oxygen Activity Tolerance: Patient tolerated treatment well Patient left: in chair;with call bell/phone within reach Nurse Communication: Mobility status;Other (comment) (no chair alarm, RN ok with this; pt requesting ativan , but then changed her mind) PT Visit Diagnosis: Unsteadiness on feet (R26.81);Repeated falls (R29.6);Muscle weakness (generalized) (M62.81);Other abnormalities of gait and mobility (R26.89);Difficulty in walking, not elsewhere classified (R26.2)     Time: 8976-8947 PT Time Calculation (min) (ACUTE ONLY): 29 min  Charges:    $Gait Training: 8-22 mins $Therapeutic Activity: 8-22 mins PT General Charges $$ ACUTE PT VISIT: 1 Visit                     Theo Ferretti, PT, DPT  Acute Rehabilitation Services  Office: [phone]    Theo CHRISTELLA Ferretti 07/09/2024, 11:53 AM

## 2024-07-09 NOTE — Plan of Care (Signed)

## 2024-07-10 ENCOUNTER — Inpatient Hospital Stay (HOSPITAL_COMMUNITY)

## 2024-07-10 DIAGNOSIS — S301XXA Contusion of abdominal wall, initial encounter: Secondary | ICD-10-CM | POA: Diagnosis not present

## 2024-07-10 LAB — C-REACTIVE PROTEIN: CRP: 19.4 mg/dL — ABNORMAL HIGH (ref <1.0)

## 2024-07-10 LAB — CBC WITH DIFFERENTIAL/PLATELET
Abs Immature Granulocytes: 0.19 K/uL — ABNORMAL HIGH (ref 0.00–0.07)
Basophils Absolute: 0 K/uL (ref 0.0–0.1)
Basophils Relative: 0 %
Eosinophils Absolute: 0 K/uL (ref 0.0–0.5)
Eosinophils Relative: 0 %
HCT: 31.5 % — ABNORMAL LOW (ref 36.0–46.0)
Hemoglobin: 10.2 g/dL — ABNORMAL LOW (ref 12.0–15.0)
Immature Granulocytes: 1 %
Lymphocytes Relative: 7 %
Lymphs Abs: 1.2 K/uL (ref 0.7–4.0)
MCH: 31.2 pg (ref 26.0–34.0)
MCHC: 32.4 g/dL (ref 30.0–36.0)
MCV: 96.3 fL (ref 80.0–100.0)
Monocytes Absolute: 1.1 K/uL — ABNORMAL HIGH (ref 0.1–1.0)
Monocytes Relative: 6 %
Neutro Abs: 14.6 K/uL — ABNORMAL HIGH (ref 1.7–7.7)
Neutrophils Relative %: 86 %
Platelets: 180 K/uL (ref 150–400)
RBC: 3.27 MIL/uL — ABNORMAL LOW (ref 3.87–5.11)
RDW: 15.3 % (ref 11.5–15.5)
WBC: 17.1 K/uL — ABNORMAL HIGH (ref 4.0–10.5)
nRBC: 0.2 % (ref 0.0–0.2)

## 2024-07-10 LAB — PROTIME-INR
INR: 4.5 (ref 0.8–1.2)
Prothrombin Time: 44.8 s — ABNORMAL HIGH (ref 11.4–15.2)

## 2024-07-10 LAB — BASIC METABOLIC PANEL WITH GFR
Anion gap: 11 (ref 5–15)
BUN: 26 mg/dL — ABNORMAL HIGH (ref 8–23)
CO2: 22 mmol/L (ref 22–32)
Calcium: 8.3 mg/dL — ABNORMAL LOW (ref 8.9–10.3)
Chloride: 97 mmol/L — ABNORMAL LOW (ref 98–111)
Creatinine, Ser: 1.27 mg/dL — ABNORMAL HIGH (ref 0.44–1.00)
GFR, Estimated: 48 mL/min — ABNORMAL LOW (ref >60)
Glucose, Bld: 107 mg/dL — ABNORMAL HIGH (ref 70–99)
Potassium: 4.4 mmol/L (ref 3.5–5.1)
Sodium: 130 mmol/L — ABNORMAL LOW (ref 135–145)

## 2024-07-10 LAB — MAGNESIUM: Magnesium: 2.1 mg/dL (ref 1.7–2.4)

## 2024-07-10 LAB — PROCALCITONIN: Procalcitonin: 0.23 ng/mL

## 2024-07-10 MED ORDER — SORBITOL 70 % SOLN: 30.0000 mL | Freq: Once | Status: DC

## 2024-07-10 MED ORDER — BISACODYL 10 MG RE SUPP
10.0000 mg | Freq: Once | RECTAL | Status: AC
  Administered 2024-07-10: 10 mg via RECTAL
  Filled 2024-07-10: qty 1

## 2024-07-10 MED ORDER — QUETIAPINE FUMARATE 25 MG PO TABS
25.0000 mg | ORAL_TABLET | Freq: Once | ORAL | Status: AC
  Administered 2024-07-10: 25 mg via ORAL
  Filled 2024-07-10: qty 1

## 2024-07-10 MED ORDER — HYDROXYZINE HCL 25 MG PO TABS
25.0000 mg | ORAL_TABLET | Freq: Three times a day (TID) | ORAL | Status: DC | PRN
  Administered 2024-07-10 – 2024-07-11 (×2): 25 mg via ORAL
  Filled 2024-07-10 (×2): qty 1

## 2024-07-10 MED ORDER — LEVALBUTEROL HCL 0.63 MG/3ML IN NEBU
0.6300 mg | INHALATION_SOLUTION | Freq: Four times a day (QID) | RESPIRATORY_TRACT | Status: DC
  Administered 2024-07-10 – 2024-07-11 (×3): 0.63 mg via RESPIRATORY_TRACT
  Filled 2024-07-10 (×3): qty 3

## 2024-07-10 MED ORDER — MELATONIN 5 MG PO TABS
5.0000 mg | ORAL_TABLET | Freq: Every day | ORAL | Status: DC
  Administered 2024-07-10: 5 mg via ORAL
  Filled 2024-07-10: qty 1

## 2024-07-10 MED ORDER — IPRATROPIUM BROMIDE 0.02 % IN SOLN
0.5000 mg | Freq: Four times a day (QID) | RESPIRATORY_TRACT | Status: DC
  Administered 2024-07-10 – 2024-07-11 (×3): 0.5 mg via RESPIRATORY_TRACT
  Filled 2024-07-10 (×3): qty 2.5

## 2024-07-10 MED ORDER — DOXYCYCLINE HYCLATE 100 MG PO TABS
100.0000 mg | ORAL_TABLET | Freq: Two times a day (BID) | ORAL | Status: DC
  Administered 2024-07-10 (×2): 100 mg via ORAL
  Filled 2024-07-10 (×2): qty 1

## 2024-07-10 MED ORDER — ONDANSETRON HCL 4 MG/2ML IJ SOLN
4.0000 mg | Freq: Four times a day (QID) | INTRAMUSCULAR | Status: DC | PRN
  Administered 2024-07-10 (×2): 4 mg via INTRAVENOUS
  Filled 2024-07-10 (×2): qty 2

## 2024-07-10 MED ORDER — CEFADROXIL 500 MG PO CAPS
500.0000 mg | ORAL_CAPSULE | Freq: Two times a day (BID) | ORAL | Status: DC
  Administered 2024-07-10: 500 mg via ORAL
  Filled 2024-07-10: qty 1

## 2024-07-10 MED ORDER — SORBITOL 70 % SOLN
30.0000 mL | Freq: Every day | Status: DC
  Administered 2024-07-10 – 2024-07-11 (×2): 30 mL via ORAL
  Filled 2024-07-10 (×3): qty 30

## 2024-07-10 NOTE — Progress Notes (Signed)
 Pt INR 4.5.  On call MD notified.  Will hold coumadin  and re-order INR later this AM.

## 2024-07-10 NOTE — Progress Notes (Signed)
 [facility] night cross cover note:   I was notified by the patient's RN this morning's INR of 4.5, up slightly from yesterday morning's value of 3.84.  This is in the context of being chronic anticoagulated on warfarin in the setting of a history of antiphospholipid antibody syndrome.  CBC this morning is notable for hemoglobin of 10.2, relative to 10.7 yesterday morning.  Additionally, platelet count this morning is 180, relative to 222 yesterday.  Will hold next dose of warfarin, and I have ordered a recheck of INR to occur around 11 AM this morning.     Eva Pore, DO Hospitalist

## 2024-07-10 NOTE — Progress Notes (Signed)
 [facility] Progress Note Patient: [name] [name] FMW:[id number] DOB: [birth year] DOA: 06/25/2024  DOS: the patient was seen and examined on 07/10/2024  Brief Hospital Course: 63 y.o. 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  Workup noted abdominal wall hematoma,. 7/3 : underwent Incision and drainage of hematoma, JP drain placement-Dr.Bridges hospital course notable for persistent hypotension, multifactorial> admission to ICU.   7/5 patient remained hypotensive and pressor dependent prompting decision to move to [facility] for further ongoing workup and management  7/5 remains pressor dependent, warfarin restarted 7/7 weaned off pressors, Foley catheter removed 7/8 transferred to [facility] service, urinary retention required I/O cath 7/10, started on IV diuresis.  Assessment and Plan: Abdominal wall hematoma status post fall Acute blood loss anemia Underwent exploration with surgery to rule out abscess 7/3, cultures negative  She does have large bruise on left side of abdomen, left hip region and left thigh JP drain in place, minimal dark bloody output -per Dr.Bridges notes from [facility], can remove JP if output<50/day, would ideally like to wait for drain removal once her JP output is less than 50 mL/day for 2 days in a row given fluctuation noted.  Likely removing tomorrow. Transfused 1 unit of PRBC this admission Hemoglobin appears stable, anticoagulation was restarted   Anti phospholipid antibody syndrome With history of CVA, on warfarin at baseline, supratherapeutic INR on admission and hematoma, acute blood loss anemia Warfarin resumed 7/5.  INR supratherapeutic and 7/12.   Persistent hypotension Multifactorial from acute blood loss anemia, CHF, cardiac meds. Requiring high dose of midodrine .  Was able to taper off  to 10 mg 3 times daily on 7/10.   CAD with prior MI Acute on chronic combined HFrEF and HFpEF status post AICD Echocardiogram 06/27/2024 with EF of 35 to 40% and grade 1 diastolic dysfunction Followed by advanced heart failure team Home regimen includes Coreg , Imdur , Entresto , Jardiance .  And torsemide  Coreg  has been resumed for now and patient is tolerating it well. Treated with IV Lasix .  Due to mild worsening of the renal function and poor p.o. intake as well as concern for ileus I will currently hold Lasix .  Likely resume tomorrow.  Paroxysmal A-fib Continue amiodarone , warfarin Currently in sinus rhythm   Recurrent falls  Wonder if hypotension is responsible for this.  PT OT consult    Hyponatremia, hypervolemic NA improved   Left distal radius fracture Patient has hard cast PCCM service as well as myself both have tried to reach out to Dr. Alyse service. Ortho tech cut a piece of the cast.  I have not heard back from Dr. Alyse office yet.   Urinary retention Required In-N-Out catheterization.  Resolved.  Chest tightness.  Due to volume overload. Elevated troponin.  Elevated D-dimer. Patient reports some chest tightness on 7/10. Chest x-ray shows evidence of volume overload. D-dimer is mildly elevated but she remains therapeutic with INR therefore less likely PE. EKG unremarkable.  Troponins are elevated again but not in ACS territory. For now pain control with Percocet.  Obesity Body mass index is 35.23 kg/m.  Placing the patient at high risk of poor outcome.  Moderate protein-calorie malnutrition  Continue supplementation and Interventions: Refer to RD note for recommendations  Hyperkalemia. AKI. Renal function was 0.69 on 7/9.  Renal function is 1.26 on 7/11. Currently receiving IV diuresis.  Potassium level 5.2. Will recheck  tomorrow.  Leukocytosis. Likely stress reaction. For now we will monitor. Procalcitonin is elevated therefore lower threshold to  initiate antibiotic if indicated.  Intractable nausea and vomiting.  Concern for ileus although most likely severe constipation. X-ray abdomen shows concern for ileus. CT abdomen ordered.  Official report is pending although on my evaluation no evidence of bowel obstruction.  Ascending colon stool burden seen. Continue bowel regimen.   Subjective: Patient tells me that she is working to drink boost.  No nausea or vomiting today.  Passing gas but no BM.  Physical Exam: General: in Mild distress, No Rash Cardiovascular: S1 and S2 Present, No Murmur Respiratory: Good respiratory effort, Bilateral Air entry present. No Crackles, No wheezes Abdomen: Bowel Sound present, No tenderness Extremities: Trace edema Neuro: Alert and oriented x3, no new focal deficit  Data Reviewed: I have Reviewed nursing notes, Vitals, and Lab results. Since last encounter, pertinent lab results CBC and BMP   . I have ordered test including CBC and BMP  .   Disposition: Status is: Inpatient Remains inpatient appropriate because: Fluid volume overload needs treatment.  SCDs Start: 06/26/24 1016 Place TED hose Start: 06/26/24 1016   Family Communication: No one at bedside Level of care: Telemetry Cardiac   Vitals:   07/10/24 0923 07/10/24 1139 07/10/24 1145 07/10/24 1627  BP: 105/79 106/77 106/77   Pulse: 86 85 88   Resp:  19 16   Temp:  97.7 F (36.5 C)  97.6 F (36.4 C)  TempSrc:  Oral  Oral  SpO2:  99% 97%   Weight:      Height:         Author: Yetta Blanch, MD 07/10/2024 7:06 PM  Please look on [email or web address] to find out who is on call.

## 2024-07-10 NOTE — Progress Notes (Signed)
 [facility] night cross cover note:   I was notified by the patient's RN that the patient is complaining of some visual hallucinations tonight, noting that the patient reports seeing spider webs that are not present.  Patient is also complaining of some dizziness when turning her head, but otherwise denies any new symptoms at this time.  I subsequently ordered Seroquel  25 mg p.o. x 1 dose now, fall precautions, and vestibular PT consult.     Eva Pore, DO Hospitalist

## 2024-07-10 NOTE — Progress Notes (Addendum)
 PHARMACY - ANTICOAGULATION CONSULT NOTE  Pharmacy Consult for warfarin Indication: VTE  Allergies  Allergen Reactions   Beef-Derived Drug Products Anaphylaxis and Nausea Only    From tick bite   Metoclopramide Hcl Anaphylaxis    Non responsive   Penicillins Anaphylaxis    Shock Immediate rash, facial/tongue/throat swelling, SOB or lightheadedness with hypotension   Pork-Derived Products Anaphylaxis and Nausea Only    From tick bite. Has tolerated heparin /lovenox    Metoclopramide Other (See Comments)    Unknown    Metoprolol Other (See Comments)    Syncope    Statins Other (See Comments)    Myalgias    Patient Measurements: Height: 5' 6 (167.6 cm) Weight: 96.7 kg (213 lb 3 oz) IBW/kg (Calculated) : 59.3 HEPARIN  DW (KG): 78  Vital Signs: Temp: 97.6 F (36.4 C) (07/12 0755) Temp Source: Oral (07/12 0755) BP: 125/75 (07/12 0315) Pulse Rate: 89 (07/12 0315)  Labs: Recent Labs    07/08/24 0245 07/08/24 1525 07/08/24 1647 07/08/24 1845 07/09/24 0315 07/09/24 0909 07/10/24 0318  HGB 10.8*  --   --   --  10.7*  --  10.2*  HCT 34.4*  --   --   --  32.7*  --  31.5*  PLT 239  --   --   --  222  --  180  LABPROT 27.2*  --   --   --  36.1*  --  44.8*  INR 2.4*  --   --   --  3.4*  --  4.5*  CREATININE  --   --   --   --   --  1.26* 1.27*  TROPONINIHS  --  59* 60* 59*  --   --   --     Estimated Creatinine Clearance: 54.6 mL/min (A) (by C-G formula based on SCr of 1.27 mg/dL (H)).   Medical History: Past Medical History:  Diagnosis Date   AICD (automatic cardioverter/defibrillator) present    Aneurysm of internal carotid artery    Anti-phospholipid syndrome (HCC)    Anticardiolipin antibody positive    Chronic Coumadin    Brain aneurysm    followed by Dr. Dolphus   Chronic combined systolic (congestive) and diastolic (congestive) heart failure (HCC)    Cocaine abuse in remission [facility])    Coronary atherosclerosis of native coronary artery    Previous  invasive cardiac testing was done at a facility in [place], [place].  Occluded diagonal, Diffuse LAD disease, Occluded Cx, and non obstructive RCA.    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 Pharmacy to resume warfarin.   Home regimen is 5mg  MWF, 2.5mg  TTSS > of note INR was 6.3 on admission requiring vit K.  INR today is supra-therapeutic at 4.5.  Hgb trending down slightly, Plts trending down.  No signs of bleeding. Nurse states that hematoma does not look  worse today with increase in INR.SABRA  Goal of Therapy:  INR 2-3 Monitor platelets by anticoagulation protocol: Yes   Plan:  Hold warfarin today Daily INR  Darrien Laakso, PharmD PGY-1 Pharmacy Resident [facility]  07/10/2024 8:18 AM

## 2024-07-11 ENCOUNTER — Inpatient Hospital Stay (HOSPITAL_COMMUNITY)

## 2024-07-11 ENCOUNTER — Other Ambulatory Visit: Payer: Self-pay

## 2024-07-11 DIAGNOSIS — I5023 Acute on chronic systolic (congestive) heart failure: Secondary | ICD-10-CM | POA: Diagnosis not present

## 2024-07-11 DIAGNOSIS — D62 Acute posthemorrhagic anemia: Secondary | ICD-10-CM | POA: Diagnosis not present

## 2024-07-11 DIAGNOSIS — R570 Cardiogenic shock: Secondary | ICD-10-CM

## 2024-07-11 DIAGNOSIS — I5040 Unspecified combined systolic (congestive) and diastolic (congestive) heart failure: Secondary | ICD-10-CM

## 2024-07-11 DIAGNOSIS — R7989 Other specified abnormal findings of blood chemistry: Secondary | ICD-10-CM

## 2024-07-11 DIAGNOSIS — I5042 Chronic combined systolic (congestive) and diastolic (congestive) heart failure: Secondary | ICD-10-CM | POA: Diagnosis not present

## 2024-07-11 DIAGNOSIS — K7200 Acute and subacute hepatic failure without coma: Secondary | ICD-10-CM

## 2024-07-11 DIAGNOSIS — S301XXA Contusion of abdominal wall, initial encounter: Secondary | ICD-10-CM | POA: Diagnosis not present

## 2024-07-11 DIAGNOSIS — I959 Hypotension, unspecified: Secondary | ICD-10-CM | POA: Diagnosis not present

## 2024-07-11 DIAGNOSIS — R4182 Altered mental status, unspecified: Secondary | ICD-10-CM | POA: Diagnosis not present

## 2024-07-11 DIAGNOSIS — H539 Unspecified visual disturbance: Secondary | ICD-10-CM

## 2024-07-11 DIAGNOSIS — R791 Abnormal coagulation profile: Secondary | ICD-10-CM | POA: Diagnosis not present

## 2024-07-11 LAB — CBC WITH DIFFERENTIAL/PLATELET
Abs Immature Granulocytes: 0.22 K/uL — ABNORMAL HIGH (ref 0.00–0.07)
Abs Immature Granulocytes: 0.22 K/uL — ABNORMAL HIGH (ref 0.00–0.07)
Basophils Absolute: 0.1 K/uL (ref 0.0–0.1)
Basophils Absolute: 0.1 K/uL (ref 0.0–0.1)
Basophils Relative: 0 %
Basophils Relative: 0 %
Eosinophils Absolute: 0 K/uL (ref 0.0–0.5)
Eosinophils Absolute: 0.2 K/uL (ref 0.0–0.5)
Eosinophils Relative: 0 %
Eosinophils Relative: 1 %
HCT: 32.8 % — ABNORMAL LOW (ref 36.0–46.0)
HCT: 30.1 % — ABNORMAL LOW (ref 36.0–46.0)
Hemoglobin: 10.6 g/dL — ABNORMAL LOW (ref 12.0–15.0)
Hemoglobin: 9.5 g/dL — ABNORMAL LOW (ref 12.0–15.0)
Immature Granulocytes: 1 %
Immature Granulocytes: 1 %
Lymphocytes Relative: 5 %
Lymphocytes Relative: 5 %
Lymphs Abs: 0.8 K/uL (ref 0.7–4.0)
Lymphs Abs: 0.9 K/uL (ref 0.7–4.0)
MCH: 31.5 pg (ref 26.0–34.0)
MCH: 31.3 pg (ref 26.0–34.0)
MCHC: 32.3 g/dL (ref 30.0–36.0)
MCHC: 31.6 g/dL (ref 30.0–36.0)
MCV: 97.6 fL (ref 80.0–100.0)
MCV: 99 fL (ref 80.0–100.0)
Monocytes Absolute: 1.1 K/uL — ABNORMAL HIGH (ref 0.1–1.0)
Monocytes Absolute: 1.2 K/uL — ABNORMAL HIGH (ref 0.1–1.0)
Monocytes Relative: 7 %
Monocytes Relative: 6 %
Neutro Abs: 13.5 K/uL — ABNORMAL HIGH (ref 1.7–7.7)
Neutro Abs: 16.4 K/uL — ABNORMAL HIGH (ref 1.7–7.7)
Neutrophils Relative %: 87 %
Neutrophils Relative %: 87 %
Platelets: 151 K/uL (ref 150–400)
Platelets: 153 K/uL (ref 150–400)
RBC: 3.36 MIL/uL — ABNORMAL LOW (ref 3.87–5.11)
RBC: 3.04 MIL/uL — ABNORMAL LOW (ref 3.87–5.11)
RDW: 15.7 % — ABNORMAL HIGH (ref 11.5–15.5)
RDW: 15.7 % — ABNORMAL HIGH (ref 11.5–15.5)
WBC: 15.6 K/uL — ABNORMAL HIGH (ref 4.0–10.5)
WBC: 18.9 K/uL — ABNORMAL HIGH (ref 4.0–10.5)
nRBC: 1.2 % — ABNORMAL HIGH (ref 0.0–0.2)
nRBC: 2 % — ABNORMAL HIGH (ref 0.0–0.2)

## 2024-07-11 LAB — BASIC METABOLIC PANEL WITH GFR
Anion gap: 14 (ref 5–15)
BUN: 40 mg/dL — ABNORMAL HIGH (ref 8–23)
CO2: 20 mmol/L — ABNORMAL LOW (ref 22–32)
Calcium: 8.3 mg/dL — ABNORMAL LOW (ref 8.9–10.3)
Chloride: 98 mmol/L (ref 98–111)
Creatinine, Ser: 1.55 mg/dL — ABNORMAL HIGH (ref 0.44–1.00)
GFR, Estimated: 38 mL/min — ABNORMAL LOW (ref >60)
Glucose, Bld: 105 mg/dL — ABNORMAL HIGH (ref 70–99)
Potassium: 4.8 mmol/L (ref 3.5–5.1)
Sodium: 132 mmol/L — ABNORMAL LOW (ref 135–145)

## 2024-07-11 LAB — PREPARE FRESH FROZEN PLASMA
Unit division: 0
Unit division: 0

## 2024-07-11 LAB — BPAM FFP
Blood Product Expiration Date: 202507142359
Blood Product Expiration Date: 202507142359
Blood Product Expiration Date: 202507152359
Blood Product Expiration Date: 202507162359
ISSUE DATE / TIME: 202507132058
ISSUE DATE / TIME: 202507132058
ISSUE DATE / TIME: 202507132058
ISSUE DATE / TIME: 202507132058
Unit Type and Rh: 600
Unit Type and Rh: 6200
Unit Type and Rh: 6200
Unit Type and Rh: 6200

## 2024-07-11 LAB — HEPATIC FUNCTION PANEL
ALT: 1578 U/L — ABNORMAL HIGH (ref 0–44)
AST: 2371 U/L — ABNORMAL HIGH (ref 15–41)
Albumin: 2.4 g/dL — ABNORMAL LOW (ref 3.5–5.0)
Alkaline Phosphatase: 178 U/L — ABNORMAL HIGH (ref 38–126)
Bilirubin, Direct: 1 mg/dL — ABNORMAL HIGH (ref 0.0–0.2)
Indirect Bilirubin: 1.2 mg/dL — ABNORMAL HIGH (ref 0.3–0.9)
Total Bilirubin: 2.2 mg/dL — ABNORMAL HIGH (ref 0.0–1.2)
Total Protein: 6 g/dL — ABNORMAL LOW (ref 6.5–8.1)

## 2024-07-11 LAB — HEPATITIS PANEL, ACUTE
HCV Ab: NONREACTIVE
Hep A IgM: NONREACTIVE
Hep B C IgM: NONREACTIVE
Hepatitis B Surface Ag: NONREACTIVE

## 2024-07-11 LAB — COOXEMETRY PANEL
Carboxyhemoglobin: 1.1 % (ref 0.5–1.5)
Methemoglobin: <0.7 % (ref 0.0–1.5)
O2 Saturation: 40.6 %
Total hemoglobin: 9.8 g/dL — ABNORMAL LOW (ref 12.0–16.0)

## 2024-07-11 LAB — PROTIME-INR
INR: 6.1 (ref 0.8–1.2)
INR: 7.8 (ref 0.8–1.2)
Prothrombin Time: 56.7 s — ABNORMAL HIGH (ref 11.4–15.2)
Prothrombin Time: 68.5 s — ABNORMAL HIGH (ref 11.4–15.2)

## 2024-07-11 LAB — MAGNESIUM: Magnesium: 2.2 mg/dL (ref 1.7–2.4)

## 2024-07-11 LAB — TYPE AND SCREEN
ABO/RH(D): A POS
Antibody Screen: NEGATIVE

## 2024-07-11 LAB — ECHOCARDIOGRAM LIMITED
Est EF: 15
Height: 66 in
S' Lateral: 5.9 cm
Weight: 3410.96 [oz_av]

## 2024-07-11 LAB — ACETAMINOPHEN LEVEL: Acetaminophen (Tylenol), Serum: <10 ug/mL — ABNORMAL LOW (ref 10–30)

## 2024-07-11 LAB — LACTIC ACID, PLASMA: Lactic Acid, Venous: 3.7 mmol/L (ref 0.5–1.9)

## 2024-07-11 LAB — GLUCOSE, CAPILLARY: Glucose-Capillary: 151 mg/dL — ABNORMAL HIGH (ref 70–99)

## 2024-07-11 LAB — AMMONIA: Ammonia: 54 umol/L — ABNORMAL HIGH (ref 9–35)

## 2024-07-11 MED ORDER — ONDANSETRON HCL 4 MG/2ML IJ SOLN
4.0000 mg | Freq: Four times a day (QID) | INTRAMUSCULAR | Status: DC | PRN
  Administered 2024-07-11: 4 mg via INTRAVENOUS
  Filled 2024-07-11: qty 2

## 2024-07-11 MED ORDER — VITAMIN K1 10 MG/ML IJ SOLN
1.0000 mg | Freq: Once | INTRAVENOUS | Status: AC
  Administered 2024-07-11: 1 mg via INTRAVENOUS
  Filled 2024-07-11 (×2): qty 0.1

## 2024-07-11 MED ORDER — SODIUM CHLORIDE 0.9 % IV SOLN: 250.0000 mL | INTRAVENOUS | Status: DC

## 2024-07-11 MED ORDER — SODIUM CHLORIDE 0.9% FLUSH: 10.0000 mL | INTRAVENOUS | Status: DC | PRN

## 2024-07-11 MED ORDER — FUROSEMIDE 10 MG/ML IJ SOLN
8.0000 mg/h | INTRAVENOUS | Status: DC
  Administered 2024-07-11: 4 mg/h via INTRAVENOUS
  Filled 2024-07-11: qty 20

## 2024-07-11 MED ORDER — LEVALBUTEROL HCL 0.63 MG/3ML IN NEBU: 0.6300 mg | INHALATION_SOLUTION | Freq: Four times a day (QID) | RESPIRATORY_TRACT | Status: DC | PRN

## 2024-07-11 MED ORDER — PANTOPRAZOLE SODIUM 40 MG IV SOLR
40.0000 mg | Freq: Every day | INTRAVENOUS | Status: DC
  Administered 2024-07-11: 40 mg via INTRAVENOUS
  Filled 2024-07-11 (×2): qty 10

## 2024-07-11 MED ORDER — VITAMIN K1 10 MG/ML IJ SOLN
5.0000 mg | Freq: Once | INTRAVENOUS | Status: AC
  Administered 2024-07-11: 5 mg via INTRAVENOUS
  Filled 2024-07-11: qty 0.5

## 2024-07-11 MED ORDER — SODIUM CHLORIDE 0.9% IV SOLUTION
Freq: Once | INTRAVENOUS | Status: DC
Start: 2024-07-11 — End: 2024-07-12

## 2024-07-11 MED ORDER — LORAZEPAM 2 MG/ML IJ SOLN
INTRAMUSCULAR | Status: AC
  Filled 2024-07-11: qty 1

## 2024-07-11 MED ORDER — NOREPINEPHRINE 4 MG/250ML-% IV SOLN
0.0000 ug/min | INTRAVENOUS | Status: DC
  Administered 2024-07-11: 2 ug/min via INTRAVENOUS
  Filled 2024-07-11: qty 250

## 2024-07-11 MED ORDER — ALBUMIN HUMAN 5 % IV SOLN
12.5000 g | Freq: Once | INTRAVENOUS | Status: AC
  Administered 2024-07-11: 12.5 g via INTRAVENOUS
  Filled 2024-07-11: qty 250

## 2024-07-11 MED ORDER — DOXYCYCLINE HYCLATE 100 MG PO TABS
100.0000 mg | ORAL_TABLET | Freq: Two times a day (BID) | ORAL | Status: DC
  Administered 2024-07-11: 100 mg via ORAL
  Filled 2024-07-11: qty 1

## 2024-07-11 MED ORDER — SODIUM CHLORIDE 0.9 % IV SOLN
12.5000 mg | Freq: Four times a day (QID) | INTRAVENOUS | Status: DC | PRN
  Filled 2024-07-11: qty 0.5

## 2024-07-11 MED ORDER — MIDODRINE HCL 5 MG PO TABS
15.0000 mg | ORAL_TABLET | Freq: Three times a day (TID) | ORAL | Status: DC
  Administered 2024-07-11 (×3): 15 mg via ORAL
  Filled 2024-07-11 (×3): qty 3

## 2024-07-11 MED ORDER — ONDANSETRON HCL 4 MG/2ML IJ SOLN
4.0000 mg | Freq: Four times a day (QID) | INTRAMUSCULAR | Status: DC | PRN
  Filled 2024-07-11: qty 2

## 2024-07-11 MED ORDER — ALBUMIN HUMAN 25 % IV SOLN: 12.5000 g | Freq: Once | INTRAVENOUS | Status: DC

## 2024-07-11 MED ORDER — SODIUM CHLORIDE 0.9 % IV SOLN: INTRAVENOUS | Status: DC | PRN

## 2024-07-11 MED ORDER — SODIUM CHLORIDE 0.9% FLUSH: 10.0000 mL | Freq: Two times a day (BID) | INTRAVENOUS | Status: DC

## 2024-07-11 MED ORDER — OXYCODONE HCL 5 MG PO TABS: 5.0000 mg | ORAL_TABLET | Freq: Four times a day (QID) | ORAL | Status: DC | PRN

## 2024-07-11 MED ORDER — DEXTROSE 5 % IV SOLN
250.0000 mg | Freq: Once | INTRAVENOUS | Status: DC
  Filled 2024-07-11: qty 8.9

## 2024-07-11 MED ORDER — SODIUM CHLORIDE 0.9 % IV SOLN
2.0000 g | Freq: Two times a day (BID) | INTRAVENOUS | Status: DC
  Administered 2024-07-11: 2 g via INTRAVENOUS
  Filled 2024-07-11 (×3): qty 40

## 2024-07-11 MED ORDER — LACTULOSE 10 GM/15ML PO SOLN
30.0000 g | Freq: Two times a day (BID) | ORAL | Status: DC
  Filled 2024-07-11: qty 45

## 2024-07-11 MED ORDER — LACTATED RINGERS IV BOLUS
250.0000 mL | Freq: Once | INTRAVENOUS | Status: AC
  Administered 2024-07-11: 250 mL via INTRAVENOUS

## 2024-07-11 MED ORDER — SODIUM CHLORIDE 0.9 % IV SOLN
1.0000 g | INTRAVENOUS | Status: DC
  Administered 2024-07-11: 1 g via INTRAVENOUS
  Filled 2024-07-11 (×2): qty 10

## 2024-07-11 MED ORDER — NOREPINEPHRINE 4 MG/250ML-% IV SOLN
0.0000 ug/min | INTRAVENOUS | Status: DC
  Administered 2024-07-11: 7 ug/min via INTRAVENOUS

## 2024-07-11 MED ORDER — MIDODRINE HCL 5 MG PO TABS: 5.0000 mg | ORAL_TABLET | Freq: Once | ORAL | Status: DC

## 2024-07-16 LAB — CULTURE, BLOOD (ROUTINE X 2)
Culture: NO GROWTH
Culture: NO GROWTH
Special Requests: ADEQUATE
Special Requests: ADEQUATE

## 2024-07-25 MED FILL — Medication: Qty: 1 | Status: AC

## 2024-07-26 ENCOUNTER — Encounter (HOSPITAL_COMMUNITY)

## 2024-07-30 NOTE — Progress Notes (Signed)
 Sepsis present on Admission.

## 2024-07-30 NOTE — Progress Notes (Addendum)
 PHARMACY - ANTICOAGULATION CONSULT NOTE  Pharmacy Consult for warfarin Indication: VTE  Allergies  Allergen Reactions   Beef-Derived Drug Products Anaphylaxis and Nausea Only    From tick bite   Metoclopramide Hcl Anaphylaxis    Non responsive   Penicillins Anaphylaxis    Shock Immediate rash, facial/tongue/throat swelling, SOB or lightheadedness with hypotension   Pork-Derived Products Anaphylaxis and Nausea Only    From tick bite. Has tolerated heparin /lovenox    Metoclopramide Other (See Comments)    Unknown    Metoprolol Other (See Comments)    Syncope    Statins Other (See Comments)    Myalgias    Patient Measurements: Height: 5' 6 (167.6 cm) Weight: 96.7 kg (213 lb 3 oz) IBW/kg (Calculated) : 59.3 HEPARIN  DW (KG): 78  Vital Signs: Temp: 98 F (36.7 C) Jul 29, 2024 0405) Temp Source: Oral 07-29-2024 0405) BP: 98/56 07/29/24 0716) Pulse Rate: 78 07-29-2024 0405)  Labs: Recent Labs    07/08/24 1525 07/08/24 1647 07/08/24 1845 07/09/24 0315 07/09/24 0315 07/09/24 0909 07/10/24 0318 July 29, 2024 0314  HGB  --   --   --  10.7*   < >  --  10.2* 10.6*  HCT  --   --   --  32.7*  --   --  31.5* 32.8*  PLT  --   --   --  222  --   --  180 151  LABPROT  --   --   --  36.1*  --   --  44.8* 56.7*  INR  --   --   --  3.4*  --   --  4.5* 6.1*  CREATININE  --   --   --   --   --  1.26* 1.27* 1.55*  TROPONINIHS 59* 60* 59*  --   --   --   --   --    < > = values in this interval not displayed.    Estimated Creatinine Clearance: 44.7 mL/min (A) (by C-G formula based on SCr of 1.55 mg/dL (H)).   Medical History: Past Medical History:  Diagnosis Date   AICD (automatic cardioverter/defibrillator) present    Aneurysm of internal carotid artery    Anti-phospholipid syndrome (HCC)    Anticardiolipin antibody positive    Chronic Coumadin    Brain aneurysm    followed by Dr. Dolphus   Chronic combined systolic (congestive) and diastolic (congestive) heart failure (HCC)    Cocaine  abuse in remission [facility])    Coronary atherosclerosis of native coronary artery    Previous invasive cardiac testing was done at a facility in [place], [place].  Occluded diagonal, Diffuse LAD disease, Occluded Cx, and non obstructive RCA.    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 Pharmacy to resume warfarin.   Home regimen is 5mg  MWF, 2.5mg  TTSS > of note INR was  6.3 on admission requiring vit K.  INR today is supra-therapeutic at 6.1.  Hgb stable, Plts trending down (151 today).  Spoke with overnight nurse to clarify bleeding status. NO signs of bleeding. Spoke with Dr. Tobie-- will give small amount of vit. K to help correct INR in setting of liver failure and poor PO diet. Will recheck INR this evening.  Goal of Therapy:  INR 2-3 Monitor platelets by anticoagulation protocol: Yes   Plan:  Hold warfarin today Vitamin K  1 mg IV x1 Recheck INR at 1700 Daily INR, CBC Continue to monitor for signs of bleeding  Carolyn Sylvia, PharmD PGY-1 Pharmacy Resident [facility]  Jul 22, 2024 7:46 AM

## 2024-07-30 NOTE — Progress Notes (Signed)
 [facility] night cross cover note:   I was notified by the patient's RN of this morning's INR of 6.1, up from yesterday's value of 4.5, with RN conveying obvious bleeding overnight.   CBC this morning reflects hemoglobin of 10.6, which is increased from most recent prior value of 10.2 yesterday morning.  Additionally, platelet count 151.   I requested updated blood pressure, which RN reported to be 89/74, with MAP 80 mmHg. this is just slightly lower than systolic blood pressure in the 90s with night shift . heart rate remains in the 70s.  Other vital signs appear stable, including afebrile, respiratory rate 18-19.   The patient is chronically anticoagulated on warfarin, which has been held in the setting of recent abdominal hematoma with elevated INR.  Most recent dose of warfarin appears to have occurred on 07/08/2024.  I have ordered a repeat INR to be checked later this morning, and have also ordered an updated type and screen as well as a 250 cc LR fluid bolus.     Eva Pore, DO Hospitalist

## 2024-07-30 NOTE — Progress Notes (Signed)
   07/22/2024 2100  Spiritual Encounters  Type of Visit Initial  Care provided to: Family  Referral source Code page  Reason for visit Patient death  OnCall Visit Yes    Chaplain was paged to code blue. Patient's nephews were present. Chaplain provided support for the family. Chaplain will be available if needed.   M.Kubra Susanna Kerry Resident [phone]

## 2024-07-30 NOTE — Progress Notes (Signed)
 PT Cancellation Note  Patient Details Name: [name] [name] MRN: [id number] DOB: April 20, 1961   Cancelled Treatment:    Reason Eval/Treat Not Completed: Medical issues which prohibited therapy  Vestibular evaluation noted. Pt has just transferred to ICU for low BP. Will hold assessment at this time.   Leontine Roads, PT, DPT [facility]  Rehabilitation Services Physical Therapist Office: [phone] Website: [email or web address]   [name] 2024-07-24, 12:31 PM

## 2024-07-30 NOTE — Progress Notes (Signed)
 Echocardiogram 2D Echocardiogram was attempted for STAT echo. Pt unavailable due to sterile picc line procedure. Please call operator for after hours echo.  Donesha Wallander N Keigan Girten,RDCS 07-28-2024, 3:38 PM

## 2024-07-30 NOTE — Consult Note (Addendum)
 Advanced Heart Failure Team Consult Note   Primary Physician: Lavell Bari LABOR, FNP Cardiologist:  Jayson Sierras, MD HF MD: DM  Reason for Consultation: Dr. Tobie   HPI:    [name] [name] is seen today for evaluation of shock at the request of Dr. Tobie   63 y.o. 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   Cath in 2008 in setting of an MI in [place].  Diagonal and LCx were totally occluded, there was diffuse LAD disease.     Echo in 8/21 EF 15-20%,  12/21 VT with IC shock. RHC/LHC showed chronically occluded D1 and 95% proximal LCx; normal filling pressures with CI 3.0.  1/22 recurrent VT   PCI to LCx.  She was started on mexiletine in addition to amiodarone .   3/22 recurrent VT. LHC stable ->  VT ablation with Dr. Inocencio.    Echo in 1/23 showed EF 30-35%, LAD territory wall motion abnormalities, normal RV, IVC normal, moderate MR.  Echo in 7/24 showed EF 30-35%, normal RV, mild MR.   Has been struggling with hypotensions and falls. On 06/25/24 presented to [facility] ER with mechanical fall -> ab wall hematoma. On 7/3 underwent surgical exploration of the wound to r/o abscess. CX negative  Echo 6/25 EF 35-40% RV ok   Remained pressor dependent and transferred to [facility] on 7/5. Weaned off pressors by 7/7. Developed recurrent hypotension and fluid overload. IV lasix  started.   ICU team consulted today due to persistent hypotension with shock liver and kidney. AST/ALT 2,371/1.578 SCr 0.69 -> 1.55. INR 6.1 PCT 0.23  WBC 18.7 -> 15.6   Feels weak. Mildly SOB. No CP   Home Medications Prior to Admission medications   Medication Sig Start Date End Date Taking? Authorizing Provider  amiodarone  (PACERONE ) 200 MG tablet TAKE 1 AND 1/2 TABLETS BY MOUTH MONDAY-THURSDAY. TAKE 2 TABLETS BY MOUTH FRIDAY-SUNDAY Patient taking differently: Take 100-200 mg by mouth See admin instructions. Take 200 mg every day at bedtime, Take 200 mg  Friday, Saturday and Sunday morning, Then take 100 mg Monday - Thursday in morning. 11/10/23  Yes Miriam Norris, NP  DULoxetine  (CYMBALTA ) 60 MG capsule Take 1 capsule (60 mg total) by mouth daily. **NEEDS TO BE SEEN BEFORE NEXT REFILL** 06/07/24  Yes Hawks, Christy A, FNP  Evolocumab  (REPATHA ) 140 MG/ML SOSY Inject 140 mg into the skin every 14 (fourteen) days.   Yes [provider]  ezetimibe  (ZETIA ) 10 MG tablet TAKE 1 TABLET BY MOUTH EVERY DAY (patient needs office visit FOR further refills) 05/07/24  Yes Rolan Ezra RAMAN, MD  mexiletine (MEXITIL ) 150 MG capsule TAKE TWO CAPSULES BY MOUTH THREE TIMES DAILY 10/09/23  Yes Waddell Danelle ORN, MD  oxyCODONE -acetaminophen  (PERCOCET) 10-325 MG tablet Take 1 tablet by mouth every 4 (four) hours as needed for pain.   Yes [provider]  pantoprazole  (PROTONIX ) 40 MG tablet TAKE 1 TABLET BY MOUTH DAILY 03/10/24  Yes McLean, Dalton S, MD  Vericiguat  (VERQUVO ) 5 MG TABS TAKE 1 TABLET BY MOUTH ONCE DAILY 01/07/24  Yes Sierras Jayson MATSU, MD  Semaglutide -Weight Management (WEGOVY ) 0.5 MG/0.5ML SOAJ Inject 0.5 mg into the skin once a week.    [provider]    Past Medical History: Past Medical History:  Diagnosis Date   AICD (automatic cardioverter/defibrillator) present    Aneurysm of internal carotid artery    Anti-phospholipid syndrome (HCC)    Anticardiolipin antibody  positive    Chronic Coumadin    Brain aneurysm    followed by Dr. Dolphus   Chronic combined systolic (congestive) and diastolic (congestive) heart failure (HCC)    Cocaine abuse in remission [facility])    Coronary atherosclerosis of native coronary artery    Previous invasive cardiac testing was done at a facility in [place], [place].  Occluded diagonal, Diffuse LAD disease, Occluded Cx, and non obstructive RCA.    Essential hypertension    GERD (gastroesophageal reflux disease)    Headache    History of pneumonia    ICD (implantable  cardioverter-defibrillator) in place    Ischemic cardiomyopathy    LVEF 30-35%   Mixed hyperlipidemia    Myocardial infarction [facility])    NSVT (nonsustained ventricular tachycardia) (HCC)    Persistent atrial fibrillation (HCC)    PVD (peripheral vascular disease) (HCC)    Raynaud's phenomenon    Statin intolerance    Stroke [facility]) 2007   Total occlusion of the right internal carotid    Past Surgical History: Past Surgical History:  Procedure Laterality Date   ABDOMINAL AORTAGRAM N/A 05/25/2014   Procedure: ABDOMINAL EZELLA;  Surgeon: Deatrice DELENA Cage, MD;  Location: [facility] CATH LAB;  Service: Cardiovascular;  Laterality: N/A;   APPENDECTOMY     BACK SURGERY  2010   Dr. Malcolm L1 cage, 5 disc fusion   CARDIAC DEFIBRILLATOR PLACEMENT  2008   [employer] ICD   CARDIOVERSION N/A 12/01/2020   Procedure: CARDIOVERSION;  Surgeon: Rolan Ezra RAMAN, MD;  Location: [facility] ENDOSCOPY;  Service: Cardiovascular;  Laterality: N/A;   CORONARY STENT INTERVENTION N/A 01/08/2021   Procedure: CORONARY STENT INTERVENTION;  Surgeon: [place], Peter M, MD;  Location: [facility] INVASIVE CV LAB;  Service: Cardiovascular;  Laterality: N/A;   EP IMPLANTABLE DEVICE N/A 03/01/2016   Procedure: ICD Generator Changeout;  Surgeon: Danelle LELON Birmingham, MD;  Location: [facility] INVASIVE CV LAB;  Service: Cardiovascular;  Laterality: N/A;   ICD/BIV ICD FUNCTION(DFT) TEST N/A 03/14/2021   Procedure: ICD/BIV ICD FUNCTION (DFT) TEST;  Surgeon: Birmingham Danelle LELON, MD;  Location: [facility] INVASIVE CV LAB;  Service: Cardiovascular;  Laterality: N/A;   INCISION AND DRAINAGE ABSCESS Left 07/01/2024   Procedure: INCISION AND DRAINAGE HEMATOMA LEFT LOWER ABDOMEN;  Surgeon: Kallie [name] BROCKS, MD;  Location: [facility] ORS;  Service: General;  Laterality: Left;   IR ANGIO INTRA EXTRACRAN SEL COM CAROTID INNOMINATE BILAT MOD SED  05/29/2023   IR ANGIO VERTEBRAL SEL SUBCLAVIAN INNOMINATE BILAT MOD SED  05/29/2023   IR RADIOLOGIST EVAL & MGMT  04/16/2023   IR US  GUIDE VASC ACCESS  RIGHT  05/29/2023   L1 corpectomy   11/29/2009   Laryngeal polyp excision     OPEN REDUCTION INTERNAL FIXATION (ORIF) DISTAL RADIAL FRACTURE Left 05/11/2024   Procedure: OPEN REDUCTION INTERNAL FIXATION (ORIF) DISTAL RADIUS FRACTURE;  Surgeon: Alyse Agent, MD;  Location: [facility] OR;  Service: Orthopedics;  Laterality: Left;   RIGHT/LEFT HEART CATH AND CORONARY ANGIOGRAPHY N/A 12/18/2020   Procedure: RIGHT/LEFT HEART CATH AND CORONARY ANGIOGRAPHY;  Surgeon: Cherrie Toribio SAUNDERS, MD;  Location: [facility] INVASIVE CV LAB;  Service: Cardiovascular;  Laterality: N/A;   RIGHT/LEFT HEART CATH AND CORONARY ANGIOGRAPHY N/A 03/05/2021   Procedure: RIGHT/LEFT HEART CATH AND CORONARY ANGIOGRAPHY;  Surgeon: Rolan Ezra RAMAN, MD;  Location: [facility] INVASIVE CV LAB;  Service: Cardiovascular;  Laterality: N/A;   TEE WITHOUT CARDIOVERSION N/A 12/01/2020   Procedure: TRANSESOPHAGEAL ECHOCARDIOGRAM (TEE);  Surgeon: Rolan Ezra RAMAN, MD;  Location: [facility] ENDOSCOPY;  Service: Cardiovascular;  Laterality: N/A;   V TACH ABLATION N/A 03/08/2021   Procedure: V TACH ABLATION;  Surgeon: Cindie Ole DASEN, MD;  Location: [facility] INVASIVE CV LAB;  Service: Cardiovascular;  Laterality: N/A;    Family History: Family History  Problem Relation Age of Onset   Uterine cancer Mother    Heart disease Father 46   Hyperlipidemia Father    Hypertension Father    Ankylosing spondylitis Sister    Heart disease Sister    Hypertension Sister    Stomach cancer Brother 59   Heart attack Brother    Breast cancer Neg Hx     Social History: Social History   Socioeconomic History   Marital status: Single    Spouse name: Not on file   Number of children: Not on file   Years of education: Not on file   Highest education level: Master's degree (e.g., MA, MS, MEng, MEd, MSW, MBA)  Occupational History   Occupation: Disabled    Comment: ESL  Tobacco Use   Smoking status: Former    Current packs/day: 0.00    Average packs/day: 0.3 packs/day for 43.4  years (10.8 ttl pk-yrs)    Types: Cigarettes, E-cigarettes    Start date: 03/03/1978    Quit date: 07/21/2021    Years since quitting: 2.9   Smokeless tobacco: Current   Tobacco comments:    4 ciggs per day  Vaping Use   Vaping status: Every Day  Substance and Sexual Activity   Alcohol use: No    Alcohol/week: 0.0 standard drinks of alcohol   Drug use: No    Comment: Prior history of cocaine   Sexual activity: Not Currently    Birth control/protection: None  Other Topics Concern   Not on file  Social History Narrative   Not on file   Social Drivers of Health   Financial Resource Strain: Low Risk  (03/10/2024)   Overall Financial Resource Strain (CARDIA)    Difficulty of Paying Living Expenses: Not very hard  Food Insecurity: No Food Insecurity (06/26/2024)   Hunger Vital Sign    Worried About Running Out of Food in the Last Year: Never true    Ran Out of Food in the Last Year: Never true  Transportation Needs: No Transportation Needs (06/26/2024)   PRAPARE - Administrator, Civil Service (Medical): No    Lack of Transportation (Non-Medical): No  Physical Activity: Inactive (03/10/2024)   Exercise Vital Sign    Days of Exercise per Week: 0 days    Minutes of Exercise per Session: 0 min  Stress: Stress Concern Present (03/10/2024)   [employer] of Occupational Health - Occupational Stress Questionnaire    Feeling of Stress : To some extent  Social Connections: Socially Isolated (06/26/2024)   Social Connection and Isolation Panel    Frequency of Communication with Friends and Family: More than three times a week    Frequency of Social Gatherings with Friends and Family: Three times a week    Attends Religious Services: Never    Active Member of Clubs or Organizations: No    Attends Banker Meetings: Never    Marital Status: Never married    Allergies:  Allergies  Allergen Reactions   Beef-Derived Drug Products Anaphylaxis and Nausea Only     From tick bite   Metoclopramide Hcl Anaphylaxis    Non responsive   Penicillins Anaphylaxis    Shock Immediate rash, facial/tongue/throat swelling, SOB or lightheadedness with hypotension   Pork-Derived  Products Anaphylaxis and Nausea Only    From tick bite. Has tolerated heparin /lovenox    Metoclopramide Other (See Comments)    Unknown    Metoprolol Other (See Comments)    Syncope    Statins Other (See Comments)    Myalgias    Objective:    Vital Signs:   Temp:  [97.6 F (36.4 C)-98.3 F (36.8 C)] 97.7 F (36.5 C) 19-Jul-2024 1230) Pulse Rate:  [53-112] 83 07-19-24 1330) Resp:  [10-31] 16 07-19-24 1330) BP: (86-107)/(56-74) 94/60 07-19-2024 1315) SpO2:  [90 %-100 %] 91 % 07-19-24 1330) Last BM Date : 19-Jul-2024  Weight change: Filed Weights   07/07/24 0400 07/08/24 0438 07/10/24 0155  Weight: 95.3 kg 99 kg 96.7 kg    Intake/Output:   Intake/Output Summary (Last 24 hours) at Jul 19, 2024 1352 Last data filed at 19-Jul-2024 1300 Gross per 24 hour  Intake 272.68 ml  Output 313 ml  Net -40.32 ml      Physical Exam    General:  Sitting up in bed No resp difficulty HEENT: normal Neck: supple. JVP to ear . Carotids 2+ bilat; no bruits. No lymphadenopathy or thyromegaly appreciated. Cor: Regular rate & rhythm. No rubs, gallops or murmurs. Lungs: clear Abdomen: obese soft, nontender, nondistended. No hepatosplenomegaly. Drain in LLQ. + bruising  Extremities: no cyanosis, clubbing, rash, 2+ edema cool  Neuro: alert & orientedx3, cranial nerves grossly intact. moves all 4 extremities w/o difficulty. Affect pleasant   Telemetry   Sinus 80s Personally reviewed   Labs   Basic Metabolic Panel: Recent Labs  Lab 07/05/24 0320 07/06/24 0518 07/07/24 0256 07/09/24 0909 07/10/24 0318 19-Jul-2024 0314  NA 132* 132* 132* 133* 130* 132*  K 3.4* 4.0 4.4 5.2* 4.4 4.8  CL 98 101 100 98 97* 98  CO2 27 25 25  15* 22 20*  GLUCOSE 83 76 81 71 107* 105*  BUN 9 7* 7* 19 26* 40*  CREATININE  0.91 0.67 0.69 1.26* 1.27* 1.55*  CALCIUM  7.8* 7.8* 7.9* 8.2* 8.3* 8.3*  MG 1.7 1.9  --  1.9 2.1 2.2    Liver Function Tests: Recent Labs  Lab 07-19-2024 0314  AST 2,371*  ALT 1,578*  ALKPHOS 178*  BILITOT 2.2*  PROT 6.0*  ALBUMIN  2.4*   No results for input(s): LIPASE, AMYLASE in the last 168 hours. No results for input(s): AMMONIA in the last 168 hours.  CBC: Recent Labs  Lab 07/07/24 0256 07/08/24 0245 07/09/24 0315 07/10/24 0318 19-Jul-2024 0314  WBC 8.4 10.3 18.7* 17.1* 15.6*  NEUTROABS  --   --   --  14.6* 13.5*  HGB 9.3* 10.8* 10.7* 10.2* 10.6*  HCT 29.1* 34.4* 32.7* 31.5* 32.8*  MCV 95.4 97.7 95.3 96.3 97.6  PLT 234 239 222 180 151    Cardiac Enzymes: No results for input(s): CKTOTAL, CKMB, CKMBINDEX, TROPONINI in the last 168 hours.  BNP: BNP (last 3 results) No results for input(s): BNP in the last 8760 hours.  ProBNP (last 3 results) No results for input(s): PROBNP in the last 8760 hours.   CBG: Recent Labs  Lab 07/05/24 1126  GLUCAP 89    Coagulation Studies: Recent Labs    07/09/24 0315 07/10/24 0318 July 19, 2024 0314  LABPROT 36.1* 44.8* 56.7*  INR 3.4* 4.5* 6.1*     Imaging   US  Abdomen Limited RUQ (LIVER/GB) Result Date: 2024-07-19 CLINICAL DATA:  Right upper quadrant abdominal pain. EXAM: ULTRASOUND ABDOMEN LIMITED RIGHT UPPER QUADRANT COMPARISON:  CT abdomen 07/10/2024 FINDINGS: Gallbladder: Small shadowing gallstones.  No evidence of gallbladder wall thickening, gallbladder wall edema or pericholecystic fluid. No sonographic Murphy's sign noted by sonographer. Common bile duct: Diameter: 2 mm Liver: The liver demonstrates coarse echotexture and increased echogenicity, likely reflecting diffuse steatosis. No overt cirrhotic contour abnormalities or focal lesions are identified. There is no evidence of intrahepatic biliary ductal dilatation. Portal vein is patent on color Doppler imaging with normal direction of blood flow  towards the liver. Other: No ascites in the right upper quadrant. IMPRESSION: 1. Cholelithiasis without evidence of acute cholecystitis or biliary obstruction. 2. Diffuse hepatic steatosis. Electronically Signed   By: Marcey Moan M.D.   On: Jul 15, 2024 10:37   CT ABDOMEN PELVIS WO CONTRAST Result Date: 07/10/2024 CLINICAL DATA:  Bowel obstruction suspected. EXAM: CT ABDOMEN AND PELVIS WITHOUT CONTRAST TECHNIQUE: Multidetector CT imaging of the abdomen and pelvis was performed following the standard protocol without IV contrast. RADIATION DOSE REDUCTION: This exam was performed according to the departmental dose-optimization program which includes automated exposure control, adjustment of the mA and/or kV according to patient size and/or use of iterative reconstruction technique. COMPARISON:  CT abdomen and pelvis 06/28/2024 FINDINGS: Lower chest: Small left and moderate right pleural effusions are present. There is atelectasis in the bilateral lower lobes. The heart is enlarged, unchanged. Hepatobiliary: The gallbladder is distended. Gallstones are present. There is no biliary ductal dilatation. There is questionable mild gallbladder wall thickening. The liver is within normal limits. Pancreas: Unremarkable. No pancreatic ductal dilatation or surrounding inflammatory changes. Spleen: Normal in size without focal abnormality. Adrenals/Urinary Tract: There is a small amount of air in the bladder. The kidneys and adrenal glands are within normal limits. Stomach/Bowel: Stomach is within normal limits. The appendix is not seen. There is scattered sigmoid colon diverticula. No evidence of bowel wall thickening, distention, or inflammatory changes. Vascular/Lymphatic: Aortic atherosclerosis. No enlarged abdominal or pelvic lymph nodes. Reproductive: Uterus and bilateral adnexa are unremarkable. Other: Presacral edema present. There is no ascites or focal abdominal wall hernia. There is diffuse body wall edema which  has increased. Percutaneous catheter is now seen in previously identified left abdominal wall hematoma. There has been near complete resolution of hematoma. Minimal hematoma persists inferiorly measuring 4.4 x 1.5 by 5.7 cm. Small hematoma in the subcutaneous tissues lateral to the left 5 is incompletely imaged and grossly unchanged. Musculoskeletal: T12-L2 fusion hardware appears unchanged. IMPRESSION: 1. No evidence for bowel obstruction. 2. Small left and moderate right pleural effusions with bibasilar atelectasis. 3. Cardiomegaly. 4. Cholelithiasis with questionable mild gallbladder wall thickening. Correlate clinically for cholecystitis. 5. Small amount of air in the bladder, possibly related to recent instrumentation. 6. Diffuse body wall edema has increased. 7. Percutaneous catheter in the left abdominal wall hematoma with near complete resolution of hematoma. Minimal hematoma persists inferiorly. Aortic Atherosclerosis (ICD10-I70.0). Electronically Signed   By: Greig Pique M.D.   On: 07/10/2024 18:53     Medications:     Current Medications:  (feeding supplement) PROSource Plus  30 mL Oral BID BM   Chlorhexidine  Gluconate Cloth  6 each Topical Q0600   doxycycline   100 mg Oral Q12H   DULoxetine   60 mg Oral Daily   lactose free nutrition  237 mL Oral TID WC   lactulose   30 g Oral BID   melatonin  5 mg Oral QHS   mexiletine  300 mg Oral TID   midodrine   15 mg Oral TID WC   multivitamin with minerals  1 tablet Oral Daily   pantoprazole  (PROTONIX ) IV  40 mg Intravenous Daily   senna-docusate  2 tablet Oral BID   simethicone   80 mg Oral QID   sorbitol   30 mL Oral Q0600   Warfarin - Pharmacist Dosing Inpatient   Does not apply q1600    Infusions:  sodium chloride      cefTRIAXone  (ROCEPHIN )  IV Stopped (07-28-24 1127)   furosemide  (LASIX ) 200 mg in dextrose  5 % 100 mL (2 mg/mL) infusion     norepinephrine  (LEVOPHED ) Adult infusion 4 mcg/min (11-09-24 1300)      Assessment/Plan    1. Shock - suspect primarily cardiogenic based on exam but cannot exclude component of septic shock - moving to ICU for inotrope/pressor support (agree with NE) - D/w Dr. Milagros - will need central access when able (INR 6.1) for pressors and to check co-ox  - check LAs - repeat stat echo  - diurese when about stable - repeat BCx  2. Acute on chronic systolic HF - due to iCM  - Echo 06/23/24 EF 35-40%  - off GDMT due to hypotension  - suspect low output with volume overload -> plan as above - If co-ox low will need to stop midodrine   3. CAD - known CTO of D1 - s/p PCI of LCX - Last cath in 3/22 showed stable CTO D1 and patent LCx stent. - No evidence ACS  4. AKI - due to shock/ATN - Scr 0.69 -> 1.5 - start inotropic support for MAP > 70  5. Shock liver - as above treat shock    6. PAF - in NSR on amio at home. Hold with shock liver  7. 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  - She is on warfarin. INR 6.1 - holding warfarin   8. H/o VT s/p ablation - s/p BSC ICD - on amio/mexilitene at home. On hold currently  9. Falls with ab wall hematoma - s/p surgical drainage  - manage hypotension as above  CRITICAL CARE Performed by: Cherrie Sieving  Total critical care time: 55 minutes  Critical care time was exclusive of separately billable procedures and treating other patients.  Critical care was necessary to treat or prevent imminent or life-threatening deterioration.  Critical care was time spent personally by me (independent of midlevel providers or residents) on the following activities: development of treatment plan with patient and/or surrogate as well as nursing, discussions with consultants, evaluation of patient's response to treatment, examination of patient, obtaining history from patient or surrogate, ordering and performing treatments and interventions, ordering and review of laboratory studies, ordering and review of radiographic  studies, pulse oximetry and re-evaluation of patient's condition.   Length of Stay: 15  Sieving Cherrie, MD  2024-07-28, 1:52 PM    Advanced Heart Failure Team Pager [phone] (M-F; 7a - 5p)  Please contact [facility] for night-coverage after hours (4p -7a ) and weekends on [email or web address]

## 2024-07-30 NOTE — Progress Notes (Signed)
 Cultures been drawn  Requested for PICC line placement  Optimize antibiotics, switch to meropenem   Discontinue doxycycline  and ceftriaxone 

## 2024-07-30 NOTE — Progress Notes (Signed)
 NAME:  [name] [name], MRN:  [id number], DOB:  1961-12-12, LOS: 15 ADMISSION DATE:  06/25/2024, CONSULTATION DATE:  07/03/2024 REFERRING MD:  Dr. Quentin - EDP , CHIEF COMPLAINT:  Hypotension    History of Present Illness:  [name] [name] is a 63 y.o. female with a PMH significant for antiphospholipid syndrome on Coumadin  HTN, HLD, ischemic cardiomyopathy s/p ERCP, prior CVA, GERD, and substance abuse who initially presented to the ED) after suffering mechanical fall.  Workup reporting all abdominal wall hematoma.  Patient was also seen persistently hypotensive prompting admission to ICU.  By 7/5 patient remained hypotensive and pressor dependent prompting decision to move to [facility] for further ongoing workup and management Was transferred to medical floor Called back to see because of hypotension, congestive hepatopathy, need for hemodynamic support and diuresis  Pertinent  Medical History   Past Medical History:  Diagnosis Date   AICD (automatic cardioverter/defibrillator) present    Aneurysm of internal carotid artery    Anti-phospholipid syndrome (HCC)    Anticardiolipin antibody positive    Chronic Coumadin    Brain aneurysm    followed by Dr. Dolphus   Chronic combined systolic (congestive) and diastolic (congestive) heart failure (HCC)    Cocaine abuse in remission [facility])    Coronary atherosclerosis of native coronary artery    Previous invasive cardiac testing was done at a facility in [place], [place].  Occluded diagonal, Diffuse LAD disease, Occluded Cx, and non obstructive RCA.    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  7/5 remains pressor dependent prompting transfer to [name] for further workup 7/7 off pressors  08/03/2024 PCCM recalled Interim History / Subjective:   Persistent hypotension Awake alert interactive, moving all extremities Right pupil noted to be about 6 mm with minimal reaction, left about 3 mm reactive  Objective    Blood pressure (!) 86/60, pulse 83, temperature 97.6 F (36.4 C), temperature source Axillary, resp. rate 18, height 5' 6 (1.676 m), weight 96.7 kg, SpO2 100%.        Intake/Output Summary (Last 24 hours) at Aug 03, 2024 1107 Last data filed at 2024/08/03 0912 Gross per 24 hour  Intake 120 ml  Output 313 ml  Net -193 ml   Filed Weights   07/07/24 0400 07/08/24 0438 07/10/24 0155  Weight: 95.3 kg 99 kg 96.7 kg    Examination: General: Middle-aged, chronically ill-appearing, pale HEENT: Moist oral mucosa Neuro: Alert and oriented x 3, following all commands, no muscle weakness, unequal pupils Chest: Clear breath sounds bilaterally Heart: S1-S2 appreciated Abdomen: Soft, nontender, bowel sounds appreciated Skin: Warm and dry  I reviewed last 24 h vitals and pain scores, last 48 h intake and output, last 24 h labs and trends, and last 24 h imaging results.  Resolved problem list   Assessment and Plan   Cardiogenic shock Combined heart failure-last echo with ejection fraction 35 to 40%, diastolic dysfunction Fluid overload Congestive hepatopathy Persistent hypotension-on midodrine  -Will transfer to ICU - Initiate pressors - Discussed with heart failure team -Will need central access and co-ox monitoring - Ordered peripheral  pressors in the short-term - Ordered Lasix  - Cultures ordered  Concern for sepsis Remains on doxycycline , ceftriaxone  - Day 2 of ceftriaxone  and doxycycline   Abdominal wall hematoma status post fall - JP drain in place  Right pleural effusion noted on CT chest and abdomen - Likely due to fluid overload  Congestive hepatopathy Coagulopathy Markedly elevated liver enzymes - Likely related to liver dysfunction - Will benefit from diuresis - Appreciate GI involvement, discussed with Dr. Legrand  INR continues to increase despite being off Coumadin  - Vitamin K  ordered  Acute blood loss anemia in setting of supratherapeutic INR - Abdominal wall hematoma - No evidence of acute GI bleeding  Antiphospholipid syndrome History of CVA - On warfarin chronically  History of atrial fibrillation - On amiodarone , anticoagulation  History of coronary artery disease status post stenting - Was on Plavix   Unequal pupils - CT head ordered and pending  Essential hypertension - Hold antihypertensives  Left distal radius fracture - Has cast in place  Hyponatremia - Monitor  Best Practice (right click and Reselect all SmartList Selections daily)   Diet/type: Regular consistency (see orders) DVT prophylaxis Coumadin  Pressure ulcer(s): Patient has stage III ulcer on left thumb due to rubbing from the cast, POA GI prophylaxis: PPI Lines: Central line Foley:  Yes, and it is no longer needed and removal ordered  Code Status:  full code Last date of multidisciplinary goals of care discussion: 7/6 patient was updated at bedside, decision was to continue full scope of care  Labs   CBC: Recent Labs  Lab 07/07/24 0256 07/08/24 0245 07/09/24 0315 07/10/24 0318 08/09/2024 0314  WBC 8.4 10.3 18.7* 17.1* 15.6*  NEUTROABS  --   --   --  14.6* 13.5*  HGB 9.3* 10.8* 10.7* 10.2* 10.6*  HCT 29.1* 34.4* 32.7* 31.5* 32.8*  MCV 95.4 97.7 95.3 96.3 97.6  PLT 234 239 222 180 151    Basic  Metabolic Panel: Recent Labs  Lab 07/05/24 0320 07/06/24 0518 07/07/24 0256 07/09/24 0909 07/10/24 0318 2024/08/09 0314  NA 132* 132* 132* 133* 130* 132*  K 3.4* 4.0 4.4 5.2* 4.4 4.8  CL 98 101 100 98 97* 98  CO2 27 25 25  15* 22 20*  GLUCOSE 83 76 81 71 107* 105*  BUN 9 7* 7* 19 26* 40*  CREATININE 0.91 0.67 0.69 1.26* 1.27* 1.55*  CALCIUM  7.8* 7.8* 7.9* 8.2* 8.3* 8.3*  MG 1.7 1.9  --  1.9 2.1 2.2   GFR: Estimated Creatinine Clearance: 44.7 mL/min (A) (by C-G formula based on SCr of 1.55 mg/dL (H)). Recent Labs  Lab 07/08/24 0245 07/09/24 0315 07/09/24 0909 07/10/24 0318 August 09, 2024 0314  PROCALCITON  --   --  0.21 0.23  --   WBC 10.3 18.7*  --  17.1* 15.6*    Liver Function Tests: Recent Labs  Lab 2024-08-09 0314  AST 2,371*  ALT 1,578*  ALKPHOS 178*  BILITOT 2.2*  PROT 6.0*  ALBUMIN  2.4*   No results for input(s): LIPASE, AMYLASE in the last 168 hours. No results for input(s): AMMONIA in the last 168 hours.  ABG    Component Value Date/Time   PHART 7.422 12/18/2020 0958  PCO2ART 46.1 12/18/2020 0958   PO2ART 125 (H) 12/18/2020 0958   HCO3 29.0 (H) 03/05/2021 0801   HCO3 28.9 (H) 03/05/2021 0801   TCO2 24 06/25/2024 2245   ACIDBASEDEF 1.0 12/18/2020 1004   O2SAT 61 07/03/2024 1905     Coagulation Profile: Recent Labs  Lab 07/07/24 0256 07/08/24 0245 07/09/24 0315 07/10/24 0318 07-17-2024 0314  INR 2.1* 2.4* 3.4* 4.5* 6.1*    Cardiac Enzymes: No results for input(s): CKTOTAL, CKMB, CKMBINDEX, TROPONINI in the last 168 hours.  HbA1C: HB A1C (BAYER DCA - WAIVED)  Date/Time Value Ref Range Status  12/29/2023 02:40 PM 4.6 (L) 4.8 - 5.6 % Final    Comment:             Prediabetes: 5.7 - 6.4          Diabetes: >6.4          Glycemic control for adults with diabetes: <7.0   05/22/2021 02:04 PM 5.2 <7.0 % Final    Comment:                                          Diabetic Adult            <7.0                                        Healthy Adult        4.3 - 5.7                                                           (DCCT/NGSP) American Diabetes Association's Summary of Glycemic Recommendations for Adults with Diabetes: Hemoglobin A1c <7.0%. More stringent glycemic goals (A1c <6.0%) may further reduce complications at the cost of increased risk of hypoglycemia.     CBG: Recent Labs  Lab 07/05/24 1126  GLUCAP 89   The patient is critically ill with multiple organ systems failure and requires high complexity decision making for assessment and support, frequent evaluation and titration of therapies, application of advanced monitoring technologies and extensive interpretation of multiple databases. Critical Care Time devoted to patient care services described in this note independent of APP/resident time (if applicable)  is 35 minutes.   Jennet Epley MD [place] [facility] Personal pager: See Amion If unanswered, please page CCM On-call: #[phone]

## 2024-07-30 NOTE — Progress Notes (Signed)
 Patient is alert and oriented x4 but had a period of visual hallucinations and dizziness when she turned her head last night. MD ordered seroquel  and a vestibular consult. BP ran low and I reported it as well-250MLs of LR ordered and bp improved slightly then dropped again around 0730. INR was reported by lab as 6.1 but patient has no overt signs of bleeding-reported this to MD.  She does have the hematoma but it does not appear to be spreading.

## 2024-07-30 NOTE — Progress Notes (Signed)
 Picc placed   Obtain co-ox

## 2024-07-30 NOTE — Progress Notes (Signed)
 The pt is being transferred to ICU, last b/p was 87/64. She will be started on pressors. Reports called to [employer]

## 2024-07-30 NOTE — Progress Notes (Signed)
 INR was despite 1 mg vitamin K  at noon  Will order for 5 mg IV x 1  Repeat INR at 10 PM

## 2024-07-30 NOTE — Progress Notes (Addendum)
 eLink Physician-Brief Progress Note Patient Name: [name] [name] DOB: [birth year] MRN: [id number]   Date of Service  2024/07/15  HPI/Events of Note  63 y.o. with history of CVA, anti-phospholipid antibody syndrome, CAD, HFrEF due to iCM, VT and PAF that presents back to the ICU for cardiogenic shock by congestive hepatopathy, pleural effusions, and concern for sepsis.  Plan is to review the case with the patient and to his diaphoretic, hypotensive, and has had minimal urine output despite Lasix  infusion.  CoOximetry 40.6%  eICU Interventions  Cardiology notified, added Diuril  and albumin   [facility] team also made aware and added FFP transfusion  Will reassess at midnight with repeat oximetry/VBG   07/24/06 -patient had a cardiac arrest and subsequently passed away.  Time of death 2127-07-25, ground team was present throughout  Intervention Category Intermediate Interventions: Hypotension - evaluation and management  [name] [name] July 15, 2024, 8:42 PM

## 2024-07-30 NOTE — TOC Progression Note (Signed)
 Transition of Care [facility]) - Progression Note    Patient Details  Name: [name] [name] MRN: [id number] Date of Birth: 03-Jul-1961  [facility]) CM/SW Contact  [address], [place], [place] Phone Number: Jul 21, 2024, 11:20 AM  Clinical Narrative:     Insurance called to re-start authorization for admission to SNF[facility]-patient has prior approval for PTAR - [id number]; SNF authorization now pending, per [place] insurance may require medical director review.  [address], LCSW Transition of Care [phone]   Expected Discharge Plan: Skilled Nursing Facility Barriers to Discharge: Continued Medical Work up, SNF Pending bed offer, Insurance Authorization  Expected Discharge Plan and Services In-house Referral: Clinical Social Work     Living arrangements for the past 2 months: Single Family Home                           HH Arranged: PT HH Agency: [facility] Date [facility] Agency Contacted: 06/28/24 Time HH Agency Contacted: [phone] Representative spoke with at [facility] Agency: [place]   Social Determinants of Health (SDOH) Interventions SDOH Screenings   Food Insecurity: No Food Insecurity (06/26/2024)  Housing: Low Risk  (06/26/2024)  Transportation Needs: No Transportation Needs (06/26/2024)  Utilities: Not At Risk (06/26/2024)  Alcohol Screen: Low Risk  (09/18/2023)  Depression (PHQ2-9): Low Risk  (03/11/2024)  Financial Resource Strain: Low Risk  (03/10/2024)  Physical Activity: Inactive (03/10/2024)  Social Connections: Socially Isolated (06/26/2024)  Stress: Stress Concern Present (03/10/2024)  Tobacco Use: High Risk (07/01/2024)  Health Literacy: Adequate Health Literacy (09/18/2023)    Readmission Risk Interventions    06/26/2024    5:05 PM  Readmission Risk Prevention Plan  Post Dischage Appt Complete  Medication Screening Complete  Transportation Screening Complete

## 2024-07-30 NOTE — Death Summary Note (Signed)
 Death Note Summary  63 y/o female with h/o CVA, Anti-phospholipid antibody syndrome, CAD, HFrEF s/p ICD, VT, PAF who was admitted with heart failure, cardiogenic shock and shock liver/congestive hepatopathy.  Patient seemed to have difficulty breathing then had a witnessed seizure event.  Soon thereafter she went into a PEA rhythm.  CPR started promptly.  She was given a total of 6 x Epinephrine , Bicarb x 1, Calcium  Chloride x 1.  CPR was done throughout following BLS/ACLS protocol.  She was intubated and somewhat difficult airway, she was very anterior, 2 attempts and second with bougie successful.  Bloody pulmonary fluid from ETT post intubation.  CPR x approximately 30 min.   Nephew (POA) who was at the waiting room asked to stop CPR when PA Gleason spoke to him.   CPR stopped at 29-Jul-2127 and she was pronounced deceased at that time as there was no pulse or respirations. Cause of death: Cardiogenic shock with contributions from Hepatic Encephalopathy, Coagulopathy, Sepsis and seizures. Orlin Fairly, MD PCCM 19-Jul-2024, 28-Jul-2146 Code and Critical Care time including intubation 40 min

## 2024-07-30 NOTE — TOC Progression Note (Signed)
 Transition of Care [facility]) - Progression Note    Patient Details  Name: [name] [name] MRN: [id number] Date of Birth: January 15, 1961  Transition of Care [facility]) CM/SW Contact  [address], [place], [place] Phone Number: 02-Aug-2024, 1:15 PM  Clinical Narrative:    Insurance authorization for SNF cancelled as patient has been transferred to the ICU.  TOC will continue to follow-will re-submit authorization when patient is medically ready,  Jonel Sick, LCSW Transition of Care    Expected Discharge Plan: Skilled Nursing Facility Barriers to Discharge: Continued Medical Work up, SNF Pending bed offer, English as a second language teacher  Expected Discharge Plan and Services In-house Referral: Clinical Social Work     Living arrangements for the past 2 months: Single Family Home                           HH Arranged: PT HH Agency: [facility] Date [facility] Agency Contacted: 06/28/24 Time HH Agency Contacted: 1546 Representative spoke with at [name] Agency: [place]   Social Determinants of Health (SDOH) Interventions SDOH Screenings   Food Insecurity: No Food Insecurity (06/26/2024)  Housing: Low Risk  (06/26/2024)  Transportation Needs: No Transportation Needs (06/26/2024)  Utilities: Not At Risk (06/26/2024)  Alcohol Screen: Low Risk  (09/18/2023)  Depression (PHQ2-9): Low Risk  (03/11/2024)  Financial Resource Strain: Low Risk  (03/10/2024)  Physical Activity: Inactive (03/10/2024)  Social Connections: Socially Isolated (06/26/2024)  Stress: Stress Concern Present (03/10/2024)  Tobacco Use: High Risk (07/01/2024)  Health Literacy: Adequate Health Literacy (09/18/2023)    Readmission Risk Interventions    06/26/2024    5:05 PM  Readmission Risk Prevention Plan  Post Dischage Appt Complete  Medication Screening Complete  Transportation Screening Complete

## 2024-07-30 NOTE — Progress Notes (Addendum)
 [facility] Progress Note Patient: [name] [name] FMW:[id number] DOB: 10-16-1961 DOA: 06/25/2024  DOS: the patient was seen and examined on 2024-08-04  Brief Hospital Course: 63 y.o. 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  Workup noted abdominal wall hematoma,. 7/3 : underwent Incision and drainage of hematoma, JP drain placement-Dr.Bridges hospital course notable for persistent hypotension, multifactorial> admission to ICU.   7/5 patient remained hypotensive and pressor dependent prompting decision to move to [facility] for further ongoing workup and management  7/5 remains pressor dependent, warfarin restarted 7/7 weaned off pressors, Foley catheter removed 7/8 transferred to [facility] service, urinary retention required I/O cath 7/10, started on IV diuresis. 7/11 chest x-ray shows vascular congestion and x-ray abdomen shows constipation.  Renal function mildly worsening. 7/12 CT abdomen performed.  Shows constipation no evidence of obstruction.  Hematoma improving.  CRP elevated.  Started on cefadroxil  and doxycycline . 08-04-24 had an episode of hallucination.  Labs in the morning showed LFTs more than 1000.  Blood pressure systolic dropping below 90.  Transferred to ICU for pressors and concern for shock.  Assessment and Plan: Abdominal wall hematoma status post fall Acute blood loss anemia Underwent exploration with surgery to rule out abscess 7/3, cultures negative  She does have large bruise on left side of abdomen, left hip region and left thigh JP drain in place, minimal dark bloody output -per Dr.Bridges notes from [facility], can remove JP if output<50/day, would ideally like to wait for drain removal once her JP output is less than 50 mL/day for 2 days in a row given fluctuation noted.  Given elevated INR continue  drain. Transfused 1 unit of PRBC this admission Hemoglobin appears stable, anticoagulation was restarted CT abdomen on 2024/08/04 shows evidence of improvement in hematoma.   Anti phospholipid antibody syndrome Supratherapeutic INR With history of CVA, on warfarin at baseline, supratherapeutic INR on admission and hematoma, acute blood loss anemia Warfarin resumed 7/5.  INR supratherapeutic 7/11.  Last Coumadin  on 7/11. Given of IV vitamin K  this morning.   Persistent hypotension Concern for cardiogenic shock. Multifactorial from acute blood loss anemia, CHF, cardiac meds. Requiring high dose of midodrine .  Was able to taper off to 10 mg 3 times daily on 7/10.  Blood pressure dropped again on August 04, 2024.  Renal function worsened as well as LFTs worsen.  Concern for cardiogenic shock versus septic shock.  Transferred to ICU for pressors and aggressive diuresis.  Appreciate PCCM and advanced heart failure service team assistance.SABRA   CAD with prior MI Acute on chronic combined HFrEF and HFpEF status post AICD Echocardiogram 06/27/2024 with EF of 35 to 40% and grade 1 diastolic dysfunction Followed by advanced heart failure team Home regimen includes Coreg , Imdur , Entresto , Jardiance .  And torsemide  Coreg  has been resumed for now and patient is tolerating it well. Treated with IV Lasix .  Due to mild worsening of the renal function and poor p.o. intake as well as concern for ileus I will currently hold Lasix .  Likely resume tomorrow.  Paroxysmal A-fib On amiodarone , warfarin.  Both on hold. Currently in sinus rhythm   Recurrent falls  Wonder if hypotension is responsible for this.  PT OT consult    Hyponatremia, hypervolemic NA improved   Left distal radius fracture Patient has hard cast PCCM service as well as myself both have tried to reach out to Dr.  Spears service. Ortho tech cut a piece of the cast.  With improvement in pain.   Urinary retention Required In-N-Out catheterization.   Resolved.  Chest tightness.  Due to volume overload. Elevated troponin.  Elevated D-dimer. Patient reports some chest tightness on 7/10. Chest x-ray shows evidence of volume overload. D-dimer is mildly elevated but she remains therapeutic with INR therefore less likely PE. EKG unremarkable.  Troponins are elevated again but not in ACS territory. For now pain control with Percocet.  Obesity Body mass index is 34.41 kg/m.  Placing the patient at high risk of poor outcome.  Moderate protein-calorie malnutrition  Continue supplementation and Interventions: Refer to RD note for recommendations  Hyperkalemia. AKI. Renal function was 0.69 on 7/9.  Renal function is 1.26 on 7/11. Currently receiving IV diuresis.  Potassium level 5.2. Will recheck tomorrow.  Leukocytosis. Likely stress reaction. For now we will monitor. Procalcitonin is elevated therefore lower threshold to initiate antibiotic if indicated.  Intractable nausea and vomiting.  Concern for ileus although most likely severe constipation. X-ray abdomen shows concern for ileus. CT abdomen ordered.  no evidence of bowel obstruction.  Ascending colon stool burden seen.  Improvement in hematoma. Continue bowel regimen.  LFT elevation.  Likely shock liver. Appreciate GI consultation. Ultrasound liver unremarkable other than cholelithiasis. Tylenol  level negative. Avoid hepatotoxic of medication.  Poor p.o. intake. In the setting of ongoing protein calorie malnutrition and prolonged poor p.o. intake may require nutritional assistance. Discussed with family, initial goal would be to help with bowel movement to ensure enteral feeding is possible and which would be preferred option.  Hallucination. Etiology not clear. Mentation is much clearer in the morning but Will continue to monitor.  Anisocoria. Right eye with mydriasis.  Left eye normal. Both are equally reactive to light. No other focal deficit on my  examination. Ordered CT head which appears to be unremarkable.   Subjective: Hallucination last night.  No nausea no vomiting at the time of my evaluation.  Minimal oral intake.  Son at bedside reports that the patient had an episode of vomiting last night.  Passing gas but no BM.  Physical Exam: General: in Mild distress, No Rash Cardiovascular: S1 and S2 Present, No Murmur Respiratory: Increased respiratory effort, Bilateral Air entry present.  Basal crackles, No wheezes Abdomen: Bowel Sound present, No tenderness Extremities: Bilateral edema Neuro: Alert and oriented x3, no new focal deficit, asterixis negative, anisocoria with right pupil dilated  Data Reviewed: I have Reviewed nursing notes, Vitals, and Lab results. Since last encounter, pertinent lab results CBC and CMP   . I have ordered test including CBC and CMP and INR  . I have discussed pt's care plan and test results with PCCM, GI, cardiology  . I have ordered imaging CT head ultrasound liver  .   Disposition: Status is: Inpatient Remains inpatient appropriate because: Requiring ICU level care now  SCDs Start: 06/26/24 1016 Place TED hose Start: 06/26/24 1016   Family Communication: Discussed with son at bedside Level of care: ICU   Vitals:   19-Jul-2024 1800 Jul 19, 2024 1815 07/19/2024 1830 07/19/2024 1845  BP: 92/67     Pulse: (!) 123     Resp: 19 19 (!) 27 14  Temp:      TempSrc:      SpO2: (!) 71%     Weight:      Height:         Author: Yetta Blanch, MD 19-Jul-2024   Please look on [email or web address] to  find out who is on call.

## 2024-07-30 NOTE — Progress Notes (Signed)
 Peripherally Inserted Central Catheter Placement  The IV Nurse has discussed with the patient and/or persons authorized to consent for the patient, the purpose of this procedure and the potential benefits and risks involved with this procedure.  The benefits include less needle sticks, lab draws from the catheter, and the patient may be discharged home with the catheter. Risks include, but not limited to, infection, bleeding, blood clot (thrombus formation), and puncture of an artery; nerve damage and irregular heartbeat and possibility to perform a PICC exchange if needed/ordered by physician.  Alternatives to this procedure were also discussed.  Bard Power PICC patient education guide, fact sheet on infection prevention and patient information card has been provided to patient /or left at bedside. obtained consent from patient's nephew who was POA at the bedside.  PICC Placement Documentation  PICC Triple Lumen 07/31/2024 Right Basilic 36 cm 0 cm (Active)  Indication for Insertion or Continuance of Line Poor Vasculature-patient has had multiple peripheral attempts or PIVs lasting less than 24 hours July 31, 2024 1610  Exposed Catheter (cm) 0 cm 2024-07-31 1610  Site Assessment Clean, Dry, Intact 2024/07/31 1610  Lumen #1 Status Flushed;Saline locked;Blood return noted Jul 31, 2024 1610  Lumen #2 Status Flushed;Saline locked;Blood return noted 07/31/24 1610  Lumen #3 Status Saline locked;Flushed;Blood return noted 07-31-24 1610  Dressing Type Transparent;Securing device 31-Jul-2024 1610  Dressing Status Antimicrobial disc/dressing in place;Clean, Dry, Intact 07-31-24 1610  Line Care Connections checked and tightened 07-31-2024 1610  Line Adjustment (NICU/IV Team Only) No 07-31-2024 1610  Dressing Intervention New dressing;Adhesive placed at insertion site (IV team only) 07-31-24 1610  Dressing Change Due 07/18/24 Jul 31, 2024 1610       Karime Scheuermann Sheral Ruth 2024-07-31, 4:11 PM

## 2024-07-30 NOTE — Plan of Care (Signed)
  Problem: Education: Goal: Knowledge of General Education information will improve Description: Including pain rating scale, medication(s)/side effects and non-pharmacologic comfort measures Outcome: Progressing   Problem: Health Behavior/Discharge Planning: Goal: Ability to manage health-related needs will improve Outcome: Progressing   Problem: Clinical Measurements: Goal: Ability to maintain clinical measurements within normal limits will improve Outcome: Progressing Goal: Will remain free from infection Outcome: Progressing Goal: Diagnostic test results will improve Outcome: Progressing Goal: Respiratory complications will improve Outcome: Progressing Goal: Cardiovascular complication will be avoided Outcome: Progressing   Problem: Activity: Goal: Risk for activity intolerance will decrease Outcome: Progressing   Problem: Coping: Goal: Level of anxiety will decrease Outcome: Progressing   Problem: Elimination: Goal: Will not experience complications related to bowel motility Outcome: Progressing   Problem: Pain Managment: Goal: General experience of comfort will improve and/or be controlled Outcome: Progressing   Problem: Safety: Goal: Ability to remain free from injury will improve Outcome: Progressing   Problem: Skin Integrity: Goal: Risk for impaired skin integrity will decrease Outcome: Progressing

## 2024-07-30 NOTE — Consult Note (Addendum)
 Consultation Note   Referring Provider:  [facility] PCP: Lavell Bari LABOR, FNP Primary Gastroenterologist:  Sampson ( saw Dr. Shaaron - inpatient)     Reason for Consultation:  Elevated LFTs DOA: 06/25/2024         Hospital Day: 25   ASSESSMENT    63 year old female with multiple medical problems who we were asked see for elevated LFTs in the setting of hypotension.  Suspect shock liver following hypotensive episode after evacuation of left lower quadrant hematoma which developed following a fall. NR was supratherapuetic at the time.   Unfortunately she has remained hypotensive, being transferred to the unit for pressure support.  She has a rising INR after resuming warfarin a week ago.  INR continues to rise despite warfarin being held 2 days ago.  INR 6 today.  Mentation is okay.  RUQ ultrasound today negative including no thrombosis of portal vein.    Visual disturbance / dizziness today.   Acute blood loss anemia in setting of  supratherapeutic INR Large left abdominal hematoma / LLE hematoma No evidence for GI bleeding  Antiphospholipid syndrome History of CVA On chronic warfarin.   A-fib On amiodarone , also warfarin  Chronic combined systolic and diastolic heart failure/ischemic cardiomyopathy   CAD Status post LHC with proximal left circumflex stent placed in January 2022. Was on Plavix  at home    See PMH for additional history  Principal Problem:   Abdominal wall hematoma Active Problems:   Mixed hyperlipidemia   Peripheral vascular disease (HCC)   Anticardiolipin syndrome (HCC)   Long term current use of anticoagulant   Dual implantable cardioverter-defibrillator in situ   GERD (gastroesophageal reflux disease)   Persistent atrial fibrillation (HCC)   Chronic systolic CHF (congestive heart failure) (HCC)   Supratherapeutic INR   Fall at home, initial encounter   Hypotension   Hypoalbuminemia due to  protein-calorie malnutrition (HCC)   Coagulopathy (HCC)   Acute renal failure (HCC)   Malnutrition of moderate degree      PLAN:   --Hold Amiodarone  --Vitamin K  already ordered --Trend LFTs --Trend H/H --Empiric PPI -- Again this is likely shock liver but acetaminophen  level pending, acute hepatitis panel pending.  --Repeat head CT for visual disturbance / dizziness? -- Patient is being transferred to ICU  HPI   63 y.o. year old female with a medical history including but not limited to V.tach / Vfib, AFIB, combined systolic and diastolic heart failure with a EF of 25 to 30%, CAD history of stent placement in January 2022, antiphospholipid syndrome requiring chronic warfarin  Brief history :  Patient was admitted to [facility] on 06/25/2024 after falling over her walker at home.  She endorses chronic hypotension with SBP's in the 80s to 90s but in the ED SBP was in the 60s.  Her INR in the ED was around 6 .  She had a scalp hematoma but CT head and neck negative . Abdominal imaging showed a large hematoma in the left abdominal wall.  She required ongoing pressure support and there was some question of whether or not the hematoma could be infected so she was treated empirically with antibiotics.  On 07/01/2024 she underwent I&D of the hematoma with placement of a  JP drain.  Culture was negative.  For persistent hypotension patient was transferred to [facility] ICU on 7/5.  Patient has remained hypotensive since getting to [facility] but out of ICU and only on midodrine  for now.  Heart failure medications have been on hold due to hypotension and she is getting volume overloaded .  Warfarin was resumed about a week ago and she has since had a steady rise in INR.  Yesterday INR rose to 4.5 and today it is up to 6.1.  She has not had any warfarin in a couple of days.  She is not having any overt GI bleeding.  No hematemesis.  No red blood in stools or melena in fact she has actually been on the constipated  side.  There is a scant amount of blood in JP drain  We were called to see patient for elevated LFTs..  When first admitted her liver enzymes were minimally elevated at less than 1x ULN.  They were not checked again until today.  AST and ALT markedly elevated at 2300 and nearly 1600 respectively.  T. bili elevated 2.3, alk phos 178.  Patient is not having any abdominal pain.  She has been having nausea and vomiting.  Nausea and vomiting actually started a couple of months ago after staring Wegovy .  However the nausea vomiting have persisted and progressed since being admitted to the hospital.  She has not had a dose of Wegovy  in 3 weeks.  No hematemesis.   Okay  Labs and Imaging:  Recent Labs    08/02/24 0314  PROT 6.0*  ALBUMIN  2.4*  AST 2,371*  ALT 1,578*  ALKPHOS 178*  BILITOT 2.2*  BILIDIR 1.0*  IBILI 1.2*   Recent Labs    07/09/24 0315 07/10/24 0318 Aug 02, 2024 0314  WBC 18.7* 17.1* 15.6*  HGB 10.7* 10.2* 10.6*  HCT 32.7* 31.5* 32.8*  MCV 95.3 96.3 97.6  PLT 222 180 151   Recent Labs    07/09/24 0909 07/10/24 0318 08/02/24 0314  NA 133* 130* 132*  K 5.2* 4.4 4.8  CL 98 97* 98  CO2 15* 22 20*  GLUCOSE 71 107* 105*  BUN 19 26* 40*  CREATININE 1.26* 1.27* 1.55*  CALCIUM  8.2* 8.3* 8.3*     CT ABDOMEN PELVIS WO CONTRAST CLINICAL DATA:  Bowel obstruction suspected.  EXAM: CT ABDOMEN AND PELVIS WITHOUT CONTRAST  TECHNIQUE: Multidetector CT imaging of the abdomen and pelvis was performed following the standard protocol without IV contrast.  RADIATION DOSE REDUCTION: This exam was performed according to the departmental dose-optimization program which includes automated exposure control, adjustment of the mA and/or kV according to patient size and/or use of iterative reconstruction technique.  COMPARISON:  CT abdomen and pelvis 06/28/2024  FINDINGS: Lower chest: Small left and moderate right pleural effusions are present. There is atelectasis in the bilateral  lower lobes. The heart is enlarged, unchanged.  Hepatobiliary: The gallbladder is distended. Gallstones are present. There is no biliary ductal dilatation. There is questionable mild gallbladder wall thickening. The liver is within normal limits.  Pancreas: Unremarkable. No pancreatic ductal dilatation or surrounding inflammatory changes.  Spleen: Normal in size without focal abnormality.  Adrenals/Urinary Tract: There is a small amount of air in the bladder. The kidneys and adrenal glands are within normal limits.  Stomach/Bowel: Stomach is within normal limits. The appendix is not seen. There is scattered sigmoid colon diverticula. No evidence of bowel wall thickening, distention, or inflammatory changes.  Vascular/Lymphatic: Aortic atherosclerosis. No enlarged abdominal or  pelvic lymph nodes.  Reproductive: Uterus and bilateral adnexa are unremarkable.  Other: Presacral edema present. There is no ascites or focal abdominal wall hernia. There is diffuse body wall edema which has increased. Percutaneous catheter is now seen in previously identified left abdominal wall hematoma. There has been near complete resolution of hematoma. Minimal hematoma persists inferiorly measuring 4.4 x 1.5 by 5.7 cm. Small hematoma in the subcutaneous tissues lateral to the left 5 is incompletely imaged and grossly unchanged.  Musculoskeletal: T12-L2 fusion hardware appears unchanged.  IMPRESSION: 1. No evidence for bowel obstruction. 2. Small left and moderate right pleural effusions with bibasilar atelectasis. 3. Cardiomegaly. 4. Cholelithiasis with questionable mild gallbladder wall thickening. Correlate clinically for cholecystitis. 5. Small amount of air in the bladder, possibly related to recent instrumentation. 6. Diffuse body wall edema has increased. 7. Percutaneous catheter in the left abdominal wall hematoma with near complete resolution of hematoma. Minimal hematoma  persists inferiorly.  Aortic Atherosclerosis (ICD10-I70.0).  Electronically Signed   By: Greig Pique M.D.   On: 07/10/2024 18:53 DG Abd Portable 1V CLINICAL DATA:  Constipation  EXAM: PORTABLE ABDOMEN - 1 VIEW  COMPARISON:  07/09/2024  FINDINGS: Unchanged, diffusely gas-filled, mildly distended small bowel loops measuring up to 4.3 cm in caliber, with gas also present throughout the nondistended colon to the rectum. No free air on supine radiographs. Scattered stool in the right colon without large burden. No acute osseous findings.  IMPRESSION: 1. Unchanged, diffusely gas-filled, mildly distended small bowel loops measuring up to 4.3 cm in caliber, with gas also present throughout the nondistended colon to the rectum. Findings are most consistent with ileus. 2. Scattered stool in the right colon without large burden.  Electronically Signed   By: Marolyn JONETTA Jaksch M.D.   On: 07/10/2024 12:39     Past Medical History:  Diagnosis Date   AICD (automatic cardioverter/defibrillator) present    Aneurysm of internal carotid artery    Anti-phospholipid syndrome (HCC)    Anticardiolipin antibody positive    Chronic Coumadin    Brain aneurysm    followed by Dr. Dolphus   Chronic combined systolic (congestive) and diastolic (congestive) heart failure (HCC)    Cocaine abuse in remission [facility])    Coronary atherosclerosis of native coronary artery    Previous invasive cardiac testing was done at a facility in [place], [place].  Occluded diagonal, Diffuse LAD disease, Occluded Cx, and non obstructive RCA.    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 Malcolm L1 cage, 5 disc fusion   CARDIAC DEFIBRILLATOR PLACEMENT  2008   [employer] ICD   CARDIOVERSION N/A 12/01/2020   Procedure: CARDIOVERSION;  Surgeon: Rolan Ezra RAMAN, MD;  Location: [facility] ENDOSCOPY;  Service: Cardiovascular;  Laterality: N/A;   CORONARY STENT INTERVENTION N/A 01/08/2021   Procedure: CORONARY STENT INTERVENTION;  Surgeon: Swaz[place], Peter M, MD;  Location: [facility] INVASIVE CV LAB;  Service: Cardiovascular;  Laterality: N/A;   EP IMPLANTABLE DEVICE N/A 03/01/2016   Procedure: ICD Generator Changeout;  Surgeon: Danelle LELON Birmingham, MD;  Location: [facility] INVASIVE CV LAB;  Service: Cardiovascular;  Laterality: N/A;   ICD/BIV ICD FUNCTION(DFT) TEST N/A 03/14/2021   Procedure: ICD/BIV ICD FUNCTION (DFT) TEST;  Surgeon: Birmingham Danelle LELON, MD;  Location: [facility] INVASIVE CV LAB;  Service: Cardiovascular;  Laterality: N/A;   INCISION AND DRAINAGE ABSCESS Left 07/01/2024   Procedure: INCISION AND DRAINAGE HEMATOMA LEFT LOWER ABDOMEN;  Surgeon: Kallie [name] BROCKS, MD;  Location: [facility] ORS;  Service: General;  Laterality: Left;   IR ANGIO INTRA EXTRACRAN SEL COM CAROTID INNOMINATE BILAT MOD SED  05/29/2023   IR ANGIO VERTEBRAL SEL SUBCLAVIAN INNOMINATE BILAT MOD SED  05/29/2023   IR RADIOLOGIST EVAL & MGMT  04/16/2023   IR US  GUIDE VASC ACCESS RIGHT  05/29/2023   L1 corpectomy   11/29/2009   Laryngeal polyp excision     OPEN REDUCTION INTERNAL FIXATION (ORIF) DISTAL RADIAL FRACTURE Left 05/11/2024   Procedure: OPEN REDUCTION INTERNAL FIXATION (ORIF) DISTAL RADIUS FRACTURE;  Surgeon: Alyse Agent, MD;  Location:  [facility] OR;  Service: Orthopedics;  Laterality: Left;   RIGHT/LEFT HEART CATH AND CORONARY ANGIOGRAPHY N/A 12/18/2020   Procedure: RIGHT/LEFT HEART CATH AND CORONARY ANGIOGRAPHY;  Surgeon: Cherrie Toribio SAUNDERS, MD;  Location: [facility] INVASIVE CV LAB;  Service: Cardiovascular;  Laterality: N/A;   RIGHT/LEFT HEART CATH AND CORONARY ANGIOGRAPHY N/A 03/05/2021   Procedure: RIGHT/LEFT HEART CATH AND CORONARY ANGIOGRAPHY;  Surgeon: Rolan Ezra RAMAN, MD;  Location: [facility] INVASIVE CV LAB;  Service: Cardiovascular;  Laterality: N/A;   TEE WITHOUT CARDIOVERSION N/A 12/01/2020   Procedure: TRANSESOPHAGEAL ECHOCARDIOGRAM (TEE);  Surgeon: Rolan Ezra RAMAN, MD;  Location: [facility] ENDOSCOPY;  Service: Cardiovascular;  Laterality: N/AMERL LULLA BOOM ABLATION N/A 03/08/2021   Procedure: LULLA BOOM ABLATION;  Surgeon: Cindie Ole DASEN, MD;  Location: [facility] INVASIVE CV LAB;  Service: Cardiovascular;  Laterality: N/A;    Family History  Problem Relation Age of Onset   Uterine cancer Mother    Heart disease Father 45   Hyperlipidemia Father    Hypertension Father    Ankylosing spondylitis Sister    Heart disease Sister    Hypertension Sister    Stomach cancer Brother 71   Heart attack Brother    Breast cancer Neg Hx     Prior to Admission medications   Medication Sig Start Date End Date Taking? Authorizing Provider  amiodarone  (PACERONE ) 200 MG tablet TAKE 1 AND 1/2 TABLETS BY MOUTH MONDAY-THURSDAY. TAKE 2 TABLETS BY MOUTH FRIDAY-SUNDAY Patient taking differently: Take 100-200 mg by mouth See admin instructions. Take 200 mg every day at bedtime, Take 200 mg Friday, Saturday and Sunday morning, Then take 100 mg Monday - Thursday in morning. 11/10/23  Yes Miriam Norris, NP  DULoxetine  (CYMBALTA ) 60 MG capsule Take 1 capsule (60 mg total) by mouth daily. **NEEDS TO BE SEEN BEFORE NEXT REFILL** 06/07/24  Yes Hawks, Christy A, FNP  Evolocumab  (REPATHA ) 140 MG/ML SOSY Inject 140 mg into the skin every 14 (fourteen) days.   Yes [provider]  ezetimibe  (ZETIA ) 10 MG tablet TAKE 1 TABLET BY MOUTH EVERY DAY (patient needs office visit FOR further refills) 05/07/24  Yes Rolan Ezra RAMAN, MD  mexiletine (MEXITIL ) 150 MG capsule TAKE TWO CAPSULES BY MOUTH THREE TIMES DAILY 10/09/23  Yes Waddell Danelle ORN, MD  oxyCODONE -acetaminophen  (PERCOCET) 10-325 MG tablet Take 1 tablet by mouth every 4 (four) hours as needed for pain.   Yes [provider]  pantoprazole  (PROTONIX ) 40 MG tablet TAKE 1 TABLET BY MOUTH DAILY 03/10/24  Yes McLean, Dalton S, MD  Vericiguat  (VERQUVO ) 5 MG TABS TAKE 1 TABLET BY MOUTH ONCE DAILY 01/07/24  Yes Debera Jayson MATSU, MD  Semaglutide -Weight Management (WEGOVY ) 0.5 MG/0.5ML SOAJ Inject 0.5 mg into the skin once a week.    [provider]    Current Facility-Administered Medications  Medication Dose Route Frequency Provider Last Rate Last Admin   (feeding supplement) PROSource Plus liquid 30 mL  30 mL Oral BID BM Patel, Pranav M, MD   30 mL at 07/10/24 1724   albumin  human 5 % solution 12.5 g  12.5 g Intravenous Once Patel, Pranav M, MD       alum & mag hydroxide-simeth (MAALOX/MYLANTA) 200-200-20 MG/5ML suspension 30 mL  30 mL Oral Q4H PRN Kallie [name] BROCKS, MD   30 mL at 07/06/24 2143   antiseptic oral rinse (BIOTENE) solution 15 mL  15 mL Mouth Rinse PRN Gleason, Leita SAUNDERS, PA-C       cefTRIAXone  (ROCEPHIN ) 1 g in sodium chloride  0.9 % 100 mL IVPB  1 g Intravenous Q24H Patel, Pranav M, MD       Chlorhexidine  Gluconate Cloth 2 % PADS 6 each  6 each Topical Q0600 Kallie [name] BROCKS, MD   6 each at July 18, 2024 0602   doxycycline  (VIBRA -TABS) tablet 100 mg  100 mg Oral Q12H Patel, Pranav M, MD       DULoxetine  (CYMBALTA ) DR capsule 60 mg  60 mg Oral Daily Kallie [name] BROCKS, MD   60 mg at 07/10/24 9077   hydrOXYzine  (ATARAX ) tablet 25 mg  25 mg Oral TID PRN Patel, Pranav M, MD   25 mg at 07/10/24 2121   ipratropium (ATROVENT ) nebulizer solution 0.5 mg  0.5 mg Nebulization Q6H Patel, Pranav M, MD    0.5 mg at Jul 18, 2024 0859   lactose free nutrition (BOOST PLUS) liquid 237 mL  237 mL Oral TID WC Patel, Pranav M, MD   237 mL at 07/10/24 1727   levalbuterol  (XOPENEX ) nebulizer solution 0.63 mg  0.63 mg Nebulization Q6H Patel, Pranav M, MD   0.63 mg at 07-18-24 0859   melatonin tablet 5 mg  5 mg Oral QHS Patel, Pranav M, MD   5 mg at 07/10/24 2121   mexiletine (MEXITIL ) capsule 300 mg  300 mg Oral TID Kallie [name] BROCKS, MD   300 mg at 07/10/24 2120   midodrine  (PROAMATINE ) tablet 15 mg  15 mg Oral TID WC Patel, Pranav M, MD       multivitamin with minerals tablet 1 tablet  1 tablet Oral Daily Patel, Pranav M, MD       ondansetron  (ZOFRAN ) injection 4 mg  4 mg Intravenous Q6H PRN Patel, Pranav M, MD       oxyCODONE -acetaminophen  (PERCOCET/ROXICET) 5-325 MG per tablet 1 tablet  1 tablet Oral Q6H PRN Patel, Pranav M, MD       And   oxyCODONE  (Oxy IR/ROXICODONE ) immediate release tablet 5 mg  5 mg Oral Q6H PRN Patel, Pranav M, MD       pantoprazole  (PROTONIX ) EC tablet 40 mg  40 mg Oral BID Johnson, Clanford L, MD   40 mg at  07/10/24 2121   phytonadione  (VITAMIN K ) 1 mg in dextrose  5 % 50 mL IVPB  1 mg Intravenous Once Tobie Yetta HERO, MD       senna-docusate (Senokot-S) tablet 2 tablet  2 tablet Oral BID Patel, Pranav M, MD   2 tablet at 07/10/24 2121   simethicone  (MYLICON) chewable tablet 80 mg  80 mg Oral QID Patel, Pranav M, MD   80 mg at 07/10/24 2121   sorbitol  70 % solution 30 mL  30 mL Oral Q0600 Tobie Yetta HERO, MD   30 mL at Jul 20, 2024 0545   Warfarin - Pharmacist Dosing Inpatient   Does not apply q1600 Cyndy Ozell DASEN, [facility]   Given at 07/08/24 1842    Allergies as of 06/25/2024 - Review Complete 06/25/2024  Allergen Reaction Noted   Beef-derived drug products Anaphylaxis 08/11/2020   Metoclopramide hcl Anaphylaxis 08/29/2011   Penicillins Anaphylaxis 04/20/2009   Pork-derived products Anaphylaxis 08/11/2020   Metoprolol Other (See Comments) 12/12/2011   Statins Other (See  Comments) 12/12/2011    Social History   Socioeconomic History   Marital status: Single    Spouse name: Not on file   Number of children: Not on file   Years of education: Not on file   Highest education level: Master's degree (e.g., MA, MS, MEng, MEd, MSW, MBA)  Occupational History   Occupation: Disabled    Comment: ESL  Tobacco Use   Smoking status: Former    Current packs/day: 0.00    Average packs/day: 0.3 packs/day for 43.4 years (10.8 ttl pk-yrs)    Types: Cigarettes, E-cigarettes    Start date: 03/03/1978    Quit date: 07/21/2021    Years since quitting: 2.9   Smokeless tobacco: Current   Tobacco comments:    4 ciggs per day  Vaping Use   Vaping status: Every Day  Substance and Sexual Activity   Alcohol use: No    Alcohol/week: 0.0 standard drinks of alcohol   Drug use: No    Comment: Prior history of cocaine   Sexual activity: Not Currently    Birth control/protection: None  Other Topics Concern   Not on file  Social History Narrative   Not on file   Social Drivers of Health   Financial Resource Strain: Low Risk  (03/10/2024)   Overall Financial Resource Strain (CARDIA)    Difficulty of Paying Living Expenses: Not very hard  Food Insecurity: No Food Insecurity (06/26/2024)   Hunger Vital Sign    Worried About Running Out of Food in the Last Year: Never true    Ran Out of Food in the Last Year: Never true  Transportation Needs: No Transportation Needs (06/26/2024)   PRAPARE - Administrator, Civil Service (Medical): No    Lack of Transportation (Non-Medical): No  Physical Activity: Inactive (03/10/2024)   Exercise Vital Sign    Days of Exercise per Week: 0 days    Minutes of Exercise per Session: 0 min  Stress: Stress Concern Present (03/10/2024)   [employer] of Occupational Health - Occupational Stress Questionnaire    Feeling of Stress : To some extent  Social Connections: Socially Isolated (06/26/2024)   Social Connection and Isolation  Panel    Frequency of Communication with Friends and Family: More than three times a week    Frequency of Social Gatherings with Friends and Family: Three times a week    Attends Religious Services: Never    Active Member of Clubs or Organizations: No  Attends Banker Meetings: Never    Marital Status: Never married  Intimate Partner Violence: Not At Risk (06/26/2024)   Humiliation, Afraid, Rape, and Kick questionnaire    Fear of Current or Ex-Partner: No    Emotionally Abused: No    Physically Abused: No    Sexually Abused: No     Code Status   Code Status: Full Code  Review of Systems: All systems reviewed and negative except where noted in HPI.  Physical Exam: Vital signs in last 24 hours: Temp:  [97.6 F (36.4 C)-98.3 F (36.8 C)] 97.6 F (36.4 C) 07/15/2024 0800) Pulse Rate:  [74-88] 83 07/15/2024 0800) Resp:  [16-20] 18 07/15/2024 0738) BP: (86-107)/(56-77) 86/60 07-15-2024 0738) SpO2:  [97 %-100 %] 100 % 07-15-24 0800) Last BM Date : 07/07/24  General:  Pleasant female in NAD Psych:  Cooperative. Normal mood and affect Ears:  Normal auditory acuity Nose: No deformity, discharge or lesions Neck:  Supple, no masses felt Lungs:  Clear to auscultation.  Heart:  Regular rate, regular rhythm.  Abdomen:  Soft, nondistended, nontender, active bowel sounds, no masses felt.  Subcutaneous edema, greatest left side of abdomen . Rectal :  Deferred Msk: Symmetrical without gross deformities.  Neurologic:  Alert, oriented, grossly normal neurologically Extremities : Pitting edema left lower extremity Skin: Extensive bruising to left abdomen, left lower extremity     Intake/Output from previous day: 07/12 0701 - 15-Jul-2024 0700 In: 120 [P.O.:120] Out: 13 [Drains:13] Intake/Output this shift:  Total I/O In: -  Out: 300 [Urine:300]   Vina Dasen, NP-C   07-15-2024, 10:17 AM  I have taken an interval history, thoroughly reviewed the chart and examined the patient. I agree  with the Advanced Practitioner's note, impression and recommendations, and have recorded additional findings, impressions and recommendations below. I performed a substantive portion of this encounter (>50% time spent), including a complete performance of the medical decision making.  My additional thoughts are as follows:  Additional exam findings that she has a dilated right pupil compared to left that is sluggishly reactive to light.  She has normal extremity movements and good close visual acuity (can count finger numbers) and tongue protrudes midline.  Primary team apparently aware of this and CT head is planned.  Critical care physician also saw this as he came toward the end of my visit with the patient's nephew present at the bedside. She is also alert and oriented with fluent speech no asterixis and normal gross motor function.  Very worrisome clinical scenario of ischemic hepatopathy in the setting of persistent hypotension after recent severe blood loss anemia from the fall induced hematoma in the setting of over therapeutic Coumadin . Her INR is rising probably at least partially from the original consumptive coagulopathy or bleeding and now due to synthetic dysfunction from the acute liver injury.  Right upper quadrant ultrasound with no thrombosis in the portal vein (has a history of clotting disorder).  At this point she needs aggressive supportive care with transfer to the ICU (which is in the works) for vasopressor support, and administration of vitamin K  (also ordered).  Acute liver injury should progress with worsening coagulopathy, she is unquestionably not a liver transplant candidate due to her medical comorbidities.  Our service will follow (Dr. Suzann starting tomorrow)  [facility] L[facility] [phone]

## 2024-07-30 NOTE — Progress Notes (Signed)
 Blood cx sent by Thelda Sheral Ruth RN were drawn via skin needle stick prior to PICC placement under sterile technique as part of PICC procedure.

## 2024-07-30 NOTE — Progress Notes (Signed)
 During initial assessment, pt noted to be air hungry and restless. Pt family reports this is new and had been worsening over the last couple of hours. Pt lungs clear/dim. Satting 95-100% on 5LNC. Pt BP soft as well, there was difficulty in obtaining consistent pressures w/ limited BP sites and pt being in shock state. Pt skin cool and dry to the touch, dusky toes and fingers observed. Pt on lasix  gtt for approx 3 hours w/ no UOP. Bladder scan shows 90cc. E-link, as well as Dr. Cesario w/ cardiology notified. Dr. Cesario placed orders for thiazide IV piggyback, ART line placement, and to increase lasix  gtt. Shortly after paging MD, pt mental status began deteriorating and WOB continued to increase. Dr. Maree was notified, who then came to bedside. Verbal order to stop lasix  gtt and give 1FFP. Plan was made to intubate but before this could be done Pt had an approx 20 second seizure and then became agonal, subsequently losing pulse. ACLS was started. Pt was coded approx 30 min and pronounced at August 12, 2127. Nephew and other family came to bedside and appeared to be grieving appropriately. All questions/concerns answered to their satisfaction.

## 2024-07-30 DEATH — deceased

## 2024-08-05 NOTE — Progress Notes (Signed)
 Remote ICD transmission.

## 2024-08-05 NOTE — Addendum Note (Signed)
 Addended by: VICCI SELLER A on: 08/05/2024 11:47 AM   Modules accepted: Orders, Level of Service

## 2024-08-27 ENCOUNTER — Ambulatory Visit: Payer: Medicare PPO

## 2024-09-08 ENCOUNTER — Encounter

## 2024-11-30 ENCOUNTER — Ambulatory Visit: Payer: Medicare PPO

## 2024-12-08 ENCOUNTER — Encounter

## 2025-02-25 ENCOUNTER — Ambulatory Visit: Payer: Medicare PPO

## 2025-03-09 ENCOUNTER — Encounter

## 2025-06-08 ENCOUNTER — Encounter

## 2025-09-07 ENCOUNTER — Encounter
